# Patient Record
Sex: Male | Born: 1960 | Race: White | Hispanic: No | Marital: Single | Smoking: Current every day smoker
Health system: Southern US, Community
[De-identification: ages and names within clinical notes are randomized; demographics above are authoritative.]

## PROBLEM LIST (undated history)

## (undated) DIAGNOSIS — M0500 Felty's syndrome, unspecified site: Secondary | ICD-10-CM

## (undated) DIAGNOSIS — M059 Rheumatoid arthritis with rheumatoid factor, unspecified: Secondary | ICD-10-CM

## (undated) DIAGNOSIS — J449 Chronic obstructive pulmonary disease, unspecified: Secondary | ICD-10-CM

## (undated) DIAGNOSIS — F17200 Nicotine dependence, unspecified, uncomplicated: Secondary | ICD-10-CM

## (undated) DIAGNOSIS — K439 Ventral hernia without obstruction or gangrene: Secondary | ICD-10-CM

## (undated) DIAGNOSIS — D61818 Other pancytopenia: Secondary | ICD-10-CM

## (undated) NOTE — *Deleted (*Deleted)
[facility] EMERGENCY DEPARTMENT Provider Note   CSN: [id number] Arrival date & time: 01/17/20  1445     History Chief Complaint  Patient presents with  . Respiratory Distress    Marcus [name] is a 66 y.o. male.  HPI     Past Medical History:  Diagnosis Date  . COPD (chronic obstructive pulmonary disease) (HCC)   . Felty syndrome (HCC)   . Hernia, epigastric   . Pancytopenia (HCC)   . Seropositive rheumatoid arthritis (HCC)   . Tobacco use disorder     Patient Active Problem List   Diagnosis Date Noted  . COPD with acute exacerbation (HCC) 08/18/2019  . 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 Anemia 11/14/2015  . Seropositive rheumatoid arthritis (HCC)   . Chronic obstructive pulmonary disease (HCC)   . Protein-calorie malnutrition, severe 07/24/2015  . Pancytopenia (HCC) 07/23/2015  . 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HCC) 06/06/2013  . Leukopenia 10/21/2012  . 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 Abnormal EKG 10/21/2012  . Joint pain 10/21/2012  . Splenomegaly 10/21/2012    History reviewed. No pertinent surgical history.     Family History  Problem Relation Age of Onset  . CAD Brother   . COPD Brother     Social History   Tobacco Use  . Smoking status: Current Every Day Smoker    Packs/day: 1.00    Types: Cigarettes  . Smokeless tobacco: Never Used  Substance Use Topics  . Alcohol use: No    Alcohol/week: 0.0 standard drinks  . Drug use: Yes    Types: Marijuana    Home Medications Prior to Admission medications   Medication Sig Start Date End Date Taking? Authorizing Provider  albuterol (PROVENTIL) (2.5 MG/3ML) 0.083% nebulizer solution Take 3 mLs (2.5 mg total) by nebulization every 6 (six) hours as needed for wheezing or shortness of breath. 08/20/19   Zannie Cove, MD  albuterol (VENTOLIN HFA) 108 (90 Base) MCG/ACT inhaler Inhale 2 puffs into the lungs every 6 (six) hours as needed for wheezing or shortness of breath. 08/20/19   Zannie Cove, MD  azithromycin  (ZITHROMAX Z-PAK) 250 MG tablet Take 2 tablets (500 mg) on  Day 1,  followed by 1 tablet (250 mg) once daily on Days 2 through 5. Patient not taking: Reported on 01/17/2020 12/01/19   Sharman Cheek, MD  mometasone-formoterol [facility]) 200-5 MCG/ACT AERO Inhale 1 puff into the lungs 2 (two) times daily. 08/20/19   Zannie Cove, MD    Allergies    Levaquin [levofloxacin in d5w] and Methotrexate  Review of Systems   Review of Systems  Physical Exam Updated Vital Signs BP 115/83 (BP Location: Right Arm)   Pulse (!) 132   Resp (!) 28   Wt 63.8 kg   SpO2 100%   BMI 20.18 kg/m   Physical Exam  ED Results / Procedures / Treatments   Labs (all labs ordered are listed, but only abnormal results are displayed) Labs Reviewed  RESPIRATORY PANEL BY RT PCR (FLU A&B, COVID)  CULTURE, BLOOD (ROUTINE X 2)  CULTURE, BLOOD (ROUTINE X 2)  LACTIC ACID, PLASMA  LACTIC ACID, PLASMA  CBC WITH DIFFERENTIAL/PLATELET  COMPREHENSIVE METABOLIC PANEL  D-DIMER, QUANTITATIVE (NOT AT [facility])  PROCALCITONIN  LACTATE DEHYDROGENASE  FERRITIN  TRIGLYCERIDES  FIBRINOGEN  C-REACTIVE PROTEIN  BLOOD GAS, VENOUS    EKG None  Radiology No results found.  Procedures Procedures (including critical care time)  Medications Ordered in ED Medications  methylPREDNISolone sodium succinate (SOLU-MEDROL) 125 mg/2 mL injection 125 mg (has  no administration in time range)    ED Course  I have reviewed the triage vital signs and the nursing notes.  Pertinent labs & imaging results that were available during my care of the patient were reviewed by me and considered in my medical decision making (see chart for details).  Clinical Course as of Jan 16 1542  Tue Jan 17, 2020  1510 I talked to his brother Marcus [name] on the phone, he wants [place] to do anything we can.   [EW]  1530 At that time I initially saw the patient oxygen saturation was mid 80s on facemask oxygen however it was not fitting well.  The patient has  now been transitioned to BiPAP, and saturating 97%.  He remains on oxygen supplementation.   [EW]    Clinical Course User Index [EW] Mancel Bale, MD   MDM Rules/Calculators/A&P                          *** Final Clinical Impression(s) / ED Diagnoses Final diagnoses:  None    Rx / DC Orders ED Discharge Orders    None

---

## 2005-02-05 ENCOUNTER — Other Ambulatory Visit: Payer: Self-pay

## 2005-02-05 ENCOUNTER — Emergency Department: Payer: Self-pay | Admitting: Emergency Medicine

## 2005-02-05 IMAGING — CR DG SHOULDER 3+V*L*
1 series · 3 of 3 positions shown · non-contrast
Comparison: none

REASON FOR EXAM: Left shoulder pain  rm 11
COMMENTS:

PROCEDURE:     DXR - DXR SHOULDER LEFT COMPLETE  - [DATE] [DATE]
RESULT:        There is no evidence of fracture, dislocation or
malalignment.  The LEFT lung apex appears unremarkable.

[Series 1: view not recorded · 0.17mm/px · 3 of 3 slices shown]
[im 1/3]
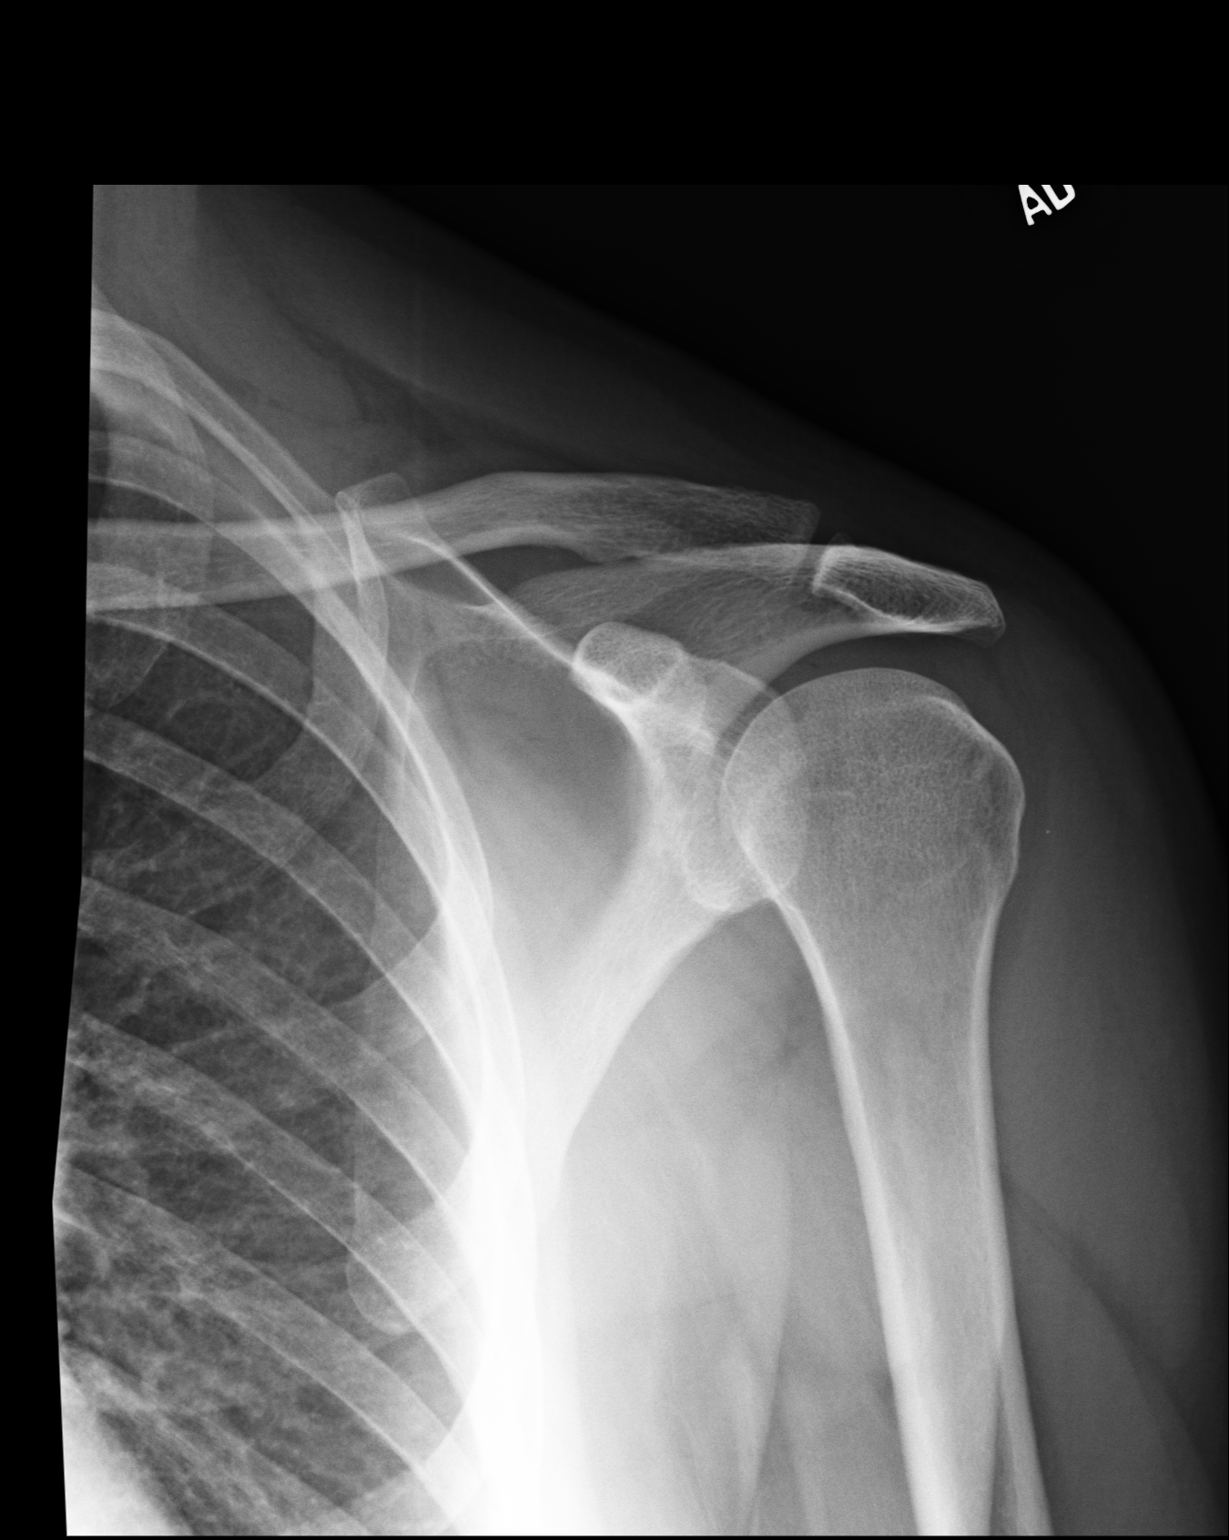
[im 2/3]
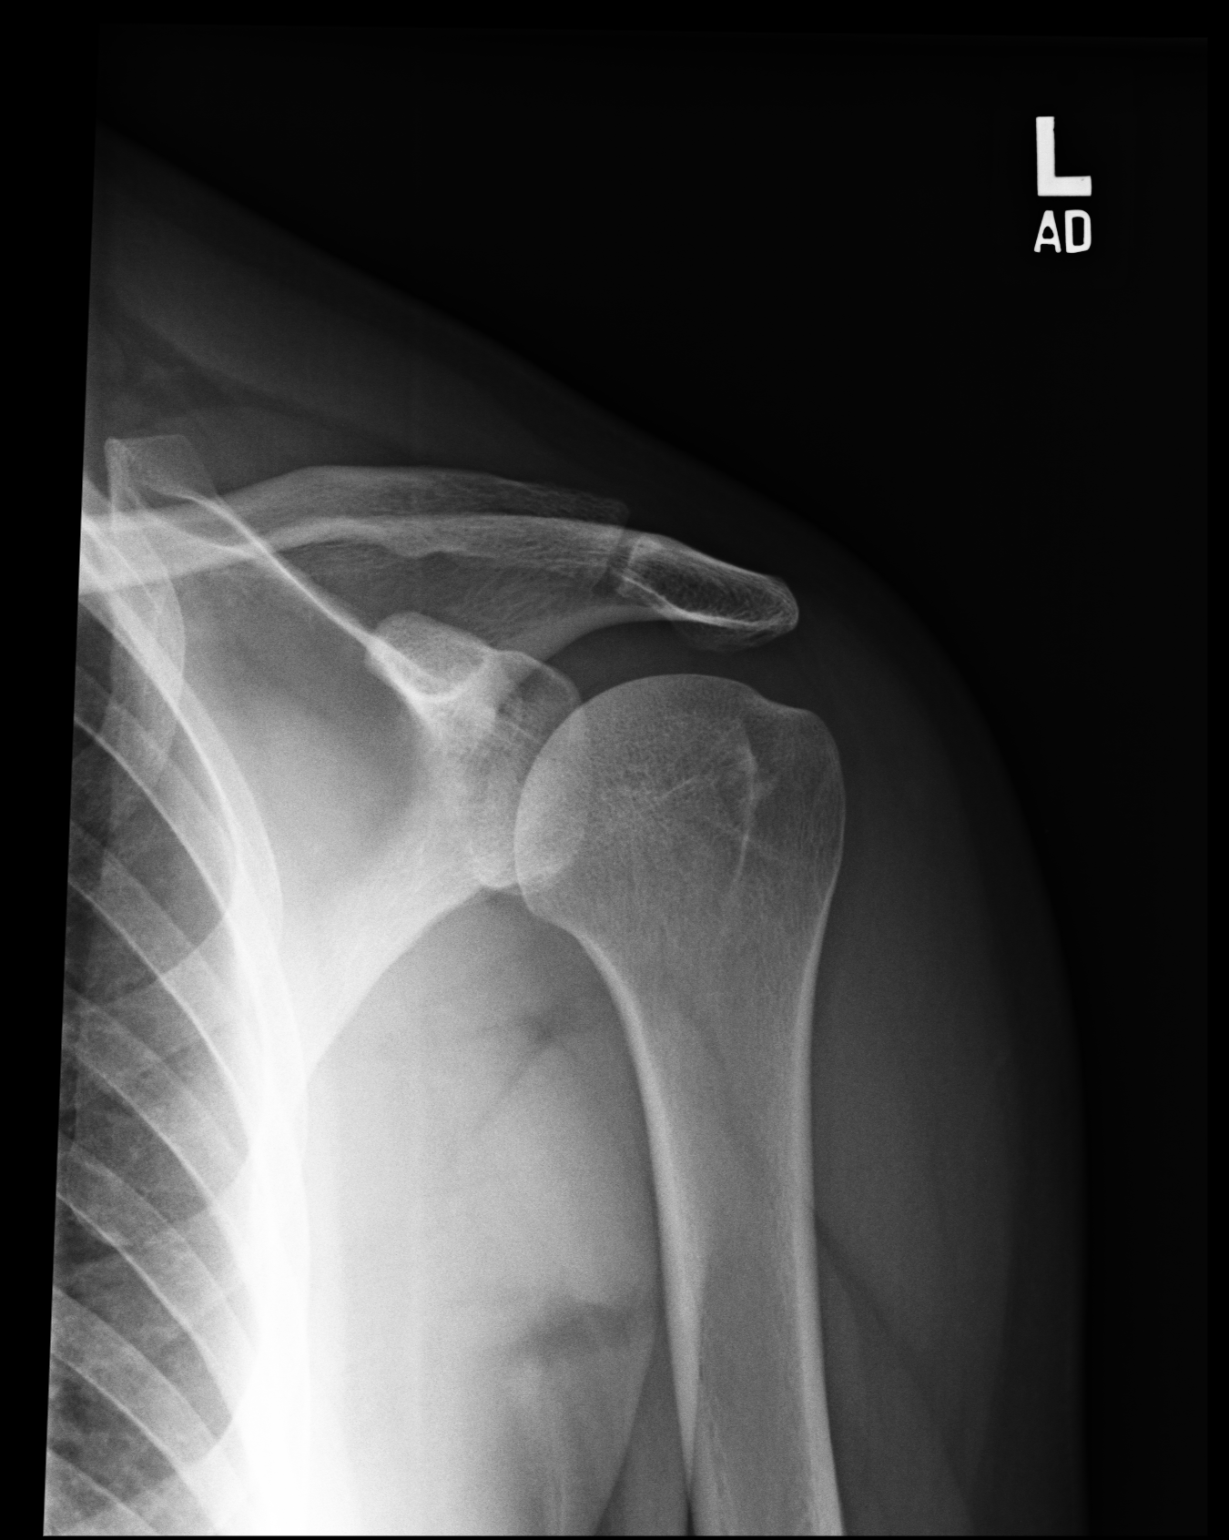
[im 3/3]
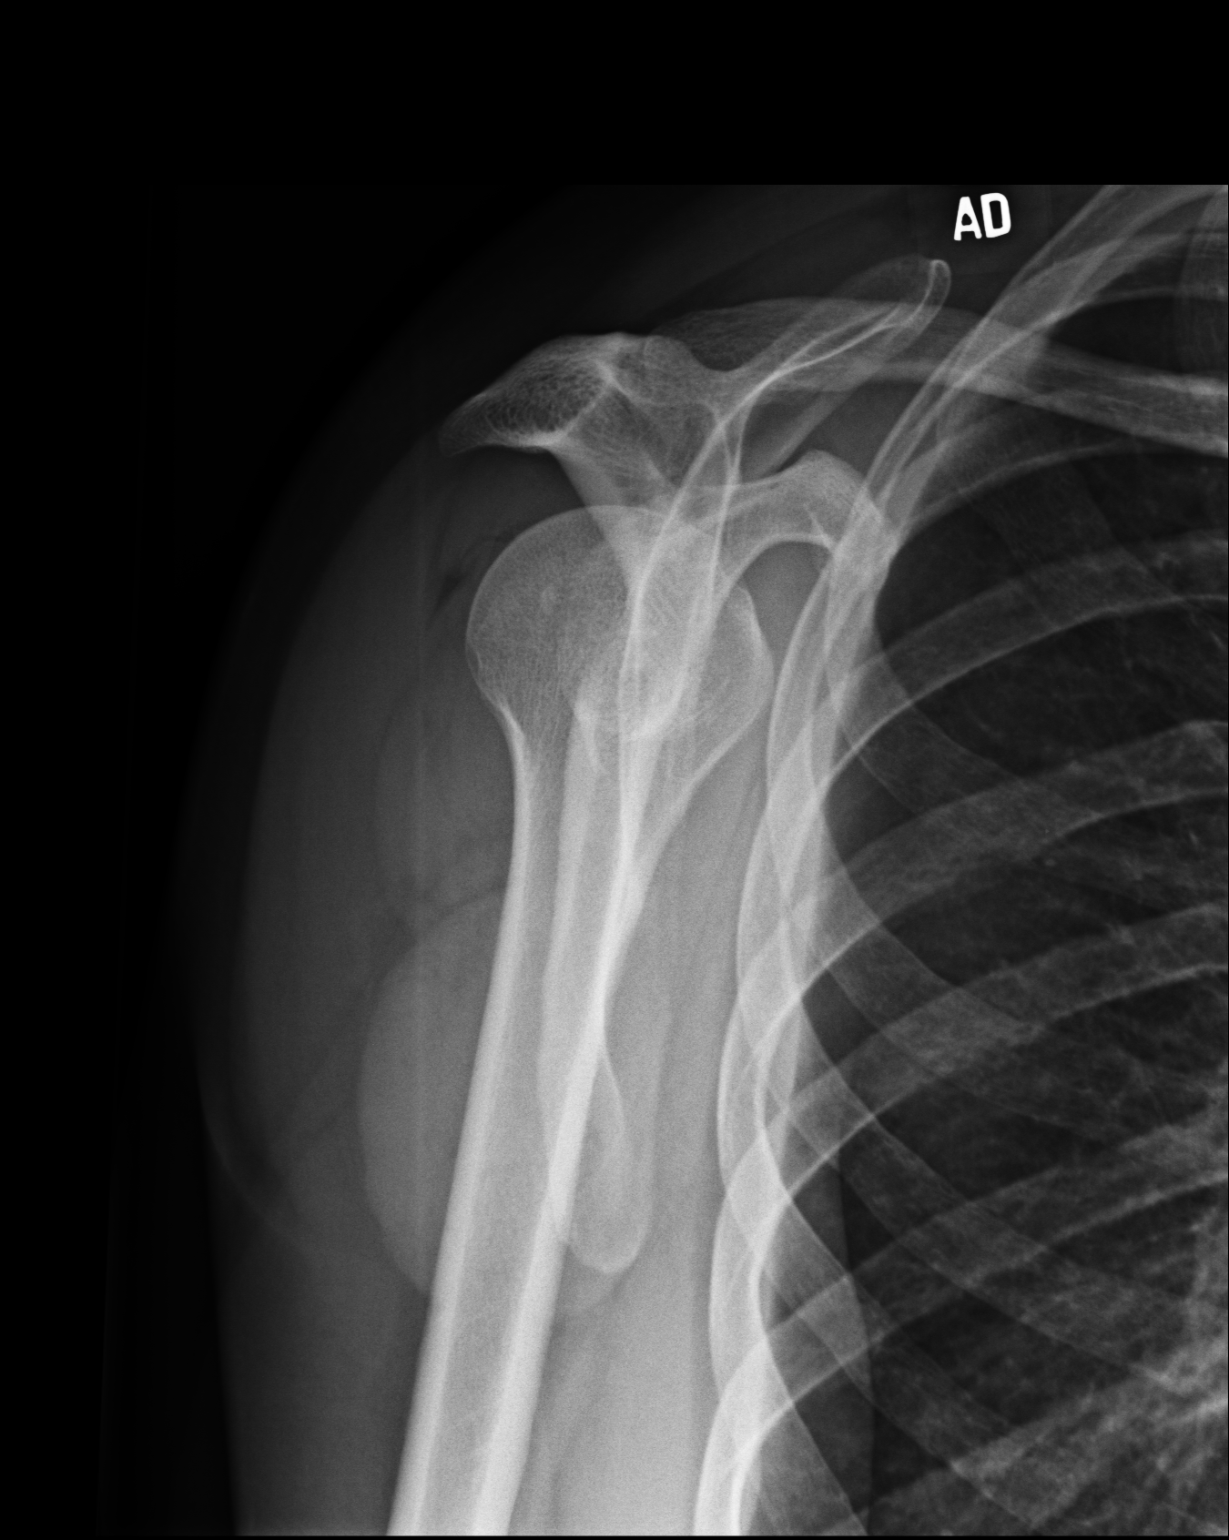

[3 of 3 positions shown; findings below may reference images not displayed]

IMPRESSION: No evidence of acute abnormalities.  If there is
persistent complaints of pain or persistent clinical concern, a repeat
evaluation in 7-10 days is recommended if clinically warranted.

## 2012-10-21 ENCOUNTER — Emergency Department (HOSPITAL_COMMUNITY)
Admission: EM | Admit: 2012-10-21 | Discharge: 2012-10-21 | Disposition: A | Discharge status: Home / Self Care | Payer: Self-pay | Attending: Emergency Medicine | Admitting: Emergency Medicine

## 2012-10-21 ENCOUNTER — Encounter (HOSPITAL_COMMUNITY): Payer: Self-pay | Admitting: *Deleted

## 2012-10-21 ENCOUNTER — Emergency Department (HOSPITAL_COMMUNITY): Payer: Self-pay

## 2012-10-21 DIAGNOSIS — M2550 Pain in unspecified joint: Secondary | ICD-10-CM | POA: Insufficient documentation

## 2012-10-21 DIAGNOSIS — L02419 Cutaneous abscess of limb, unspecified: Secondary | ICD-10-CM

## 2012-10-21 DIAGNOSIS — R059 Cough, unspecified: Secondary | ICD-10-CM | POA: Insufficient documentation

## 2012-10-21 DIAGNOSIS — D696 Thrombocytopenia, unspecified: Secondary | ICD-10-CM | POA: Insufficient documentation

## 2012-10-21 DIAGNOSIS — M79609 Pain in unspecified limb: Secondary | ICD-10-CM | POA: Insufficient documentation

## 2012-10-21 DIAGNOSIS — F17200 Nicotine dependence, unspecified, uncomplicated: Secondary | ICD-10-CM | POA: Insufficient documentation

## 2012-10-21 DIAGNOSIS — R9431 Abnormal electrocardiogram [ECG] [EKG]: Secondary | ICD-10-CM

## 2012-10-21 DIAGNOSIS — J029 Acute pharyngitis, unspecified: Secondary | ICD-10-CM | POA: Insufficient documentation

## 2012-10-21 DIAGNOSIS — R161 Splenomegaly, not elsewhere classified: Secondary | ICD-10-CM

## 2012-10-21 DIAGNOSIS — IMO0002 Reserved for concepts with insufficient information to code with codable children: Secondary | ICD-10-CM | POA: Insufficient documentation

## 2012-10-21 DIAGNOSIS — D72819 Decreased white blood cell count, unspecified: Secondary | ICD-10-CM | POA: Insufficient documentation

## 2012-10-21 DIAGNOSIS — M7989 Other specified soft tissue disorders: Secondary | ICD-10-CM | POA: Insufficient documentation

## 2012-10-21 DIAGNOSIS — R05 Cough: Secondary | ICD-10-CM | POA: Insufficient documentation

## 2012-10-21 DIAGNOSIS — G8929 Other chronic pain: Secondary | ICD-10-CM | POA: Insufficient documentation

## 2012-10-21 LAB — COMPREHENSIVE METABOLIC PANEL
AST: 10 U/L (ref 0–37)
Albumin: 3.1 g/dL — ABNORMAL LOW (ref 3.5–5.2)
Alkaline Phosphatase: 58 U/L (ref 39–117)
Chloride: 99 mEq/L (ref 96–112)
Creatinine, Ser: 0.7 mg/dL (ref 0.50–1.35)
Potassium: 3.7 mEq/L (ref 3.5–5.1)
Total Bilirubin: 0.5 mg/dL (ref 0.3–1.2)
Total Protein: 8.7 g/dL — ABNORMAL HIGH (ref 6.0–8.3)

## 2012-10-21 LAB — URINALYSIS, ROUTINE W REFLEX MICROSCOPIC
Glucose, UA: NEGATIVE mg/dL
Ketones, ur: NEGATIVE mg/dL
Leukocytes, UA: NEGATIVE
Nitrite: NEGATIVE
Specific Gravity, Urine: >1.03 — ABNORMAL HIGH (ref 1.005–1.030)
pH: 5.5 (ref 5.0–8.0)

## 2012-10-21 LAB — CBC WITH DIFFERENTIAL/PLATELET
Basophils Absolute: 0 10*3/uL (ref 0.0–0.1)
Eosinophils Absolute: 0.1 10*3/uL (ref 0.0–0.7)
Lymphocytes Relative: 49 % — ABNORMAL HIGH (ref 12–46)
Monocytes Relative: 24 % — ABNORMAL HIGH (ref 3–12)
Platelets: 132 10*3/uL — ABNORMAL LOW (ref 150–400)
RBC: 4.1 MIL/uL — ABNORMAL LOW (ref 4.22–5.81)
WBC: 1.5 10*3/uL — ABNORMAL LOW (ref 4.0–10.5)

## 2012-10-21 LAB — URINE MICROSCOPIC-ADD ON

## 2012-10-21 IMAGING — CR DG CHEST 2V
2 series · 2 of 2 positions shown · non-contrast
Comparison: None.

CLINICAL DATA: Hemoptysis, chest pain intermittently

CHEST - 2 VIEW

[view not recorded (1 of 2)]
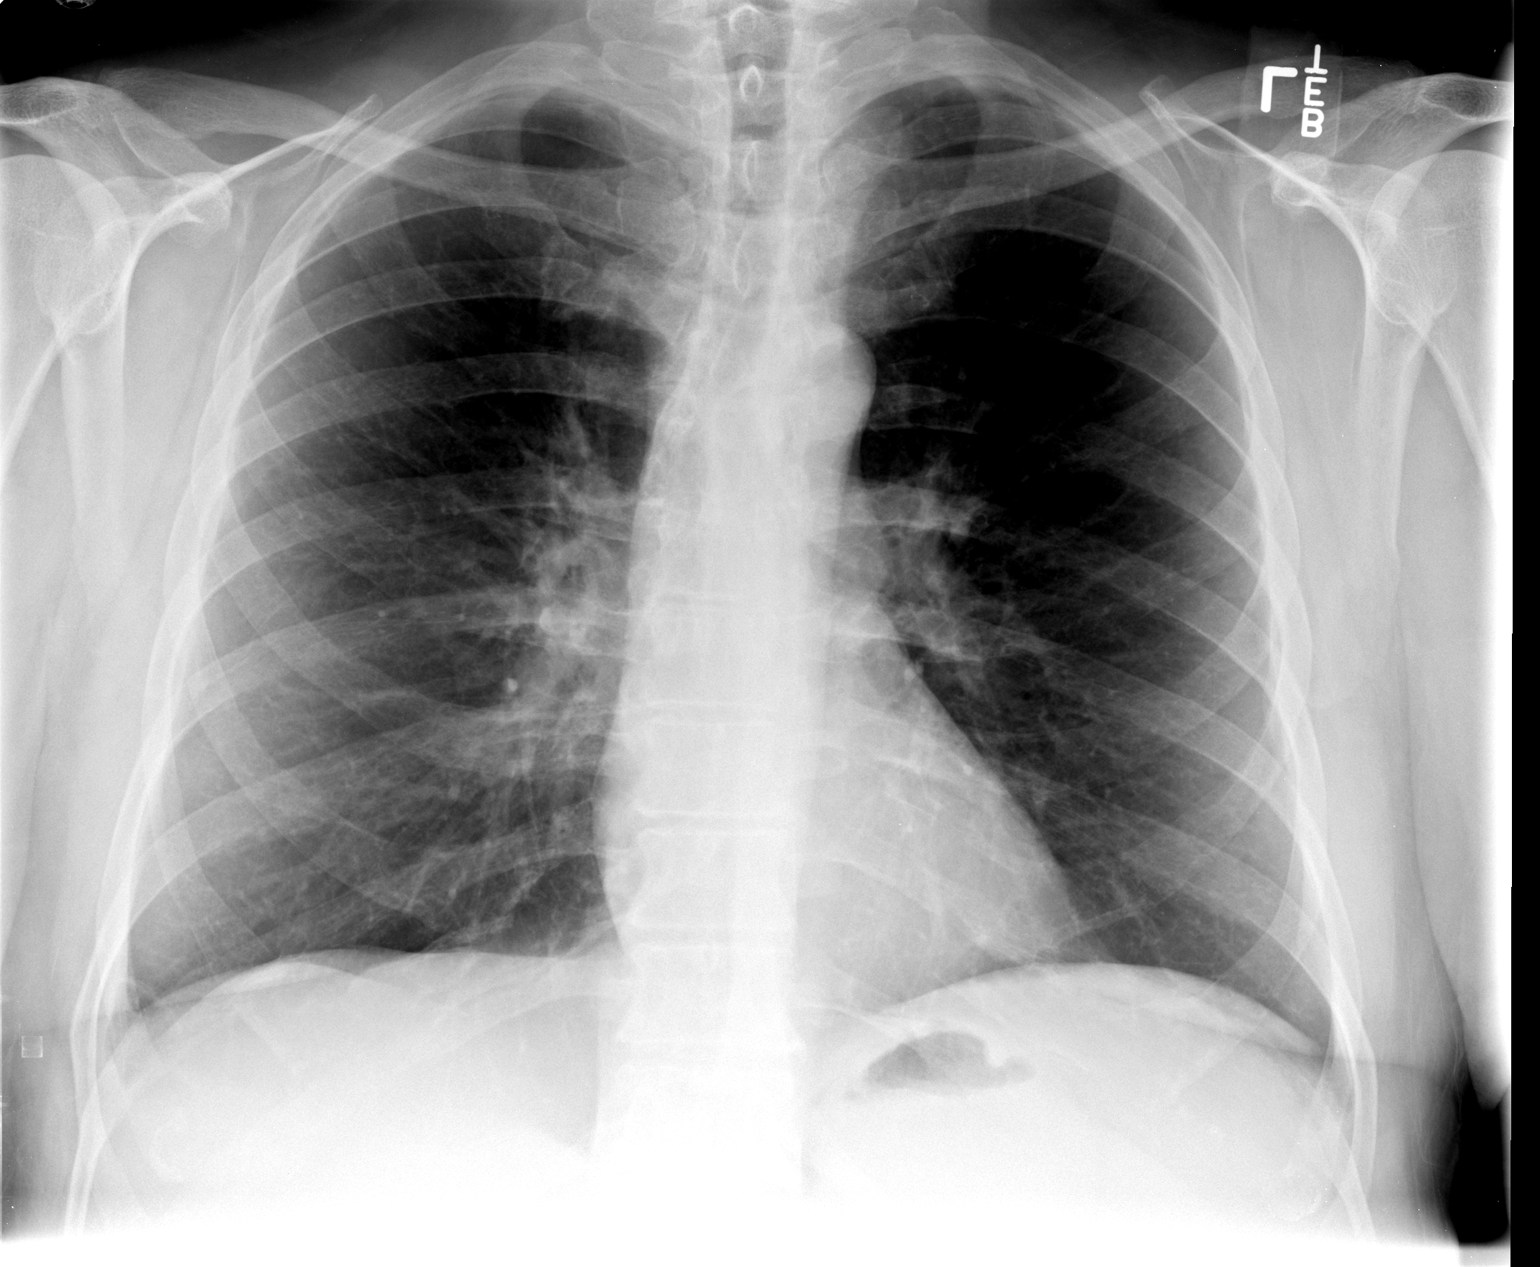

[view not recorded (2 of 2)]
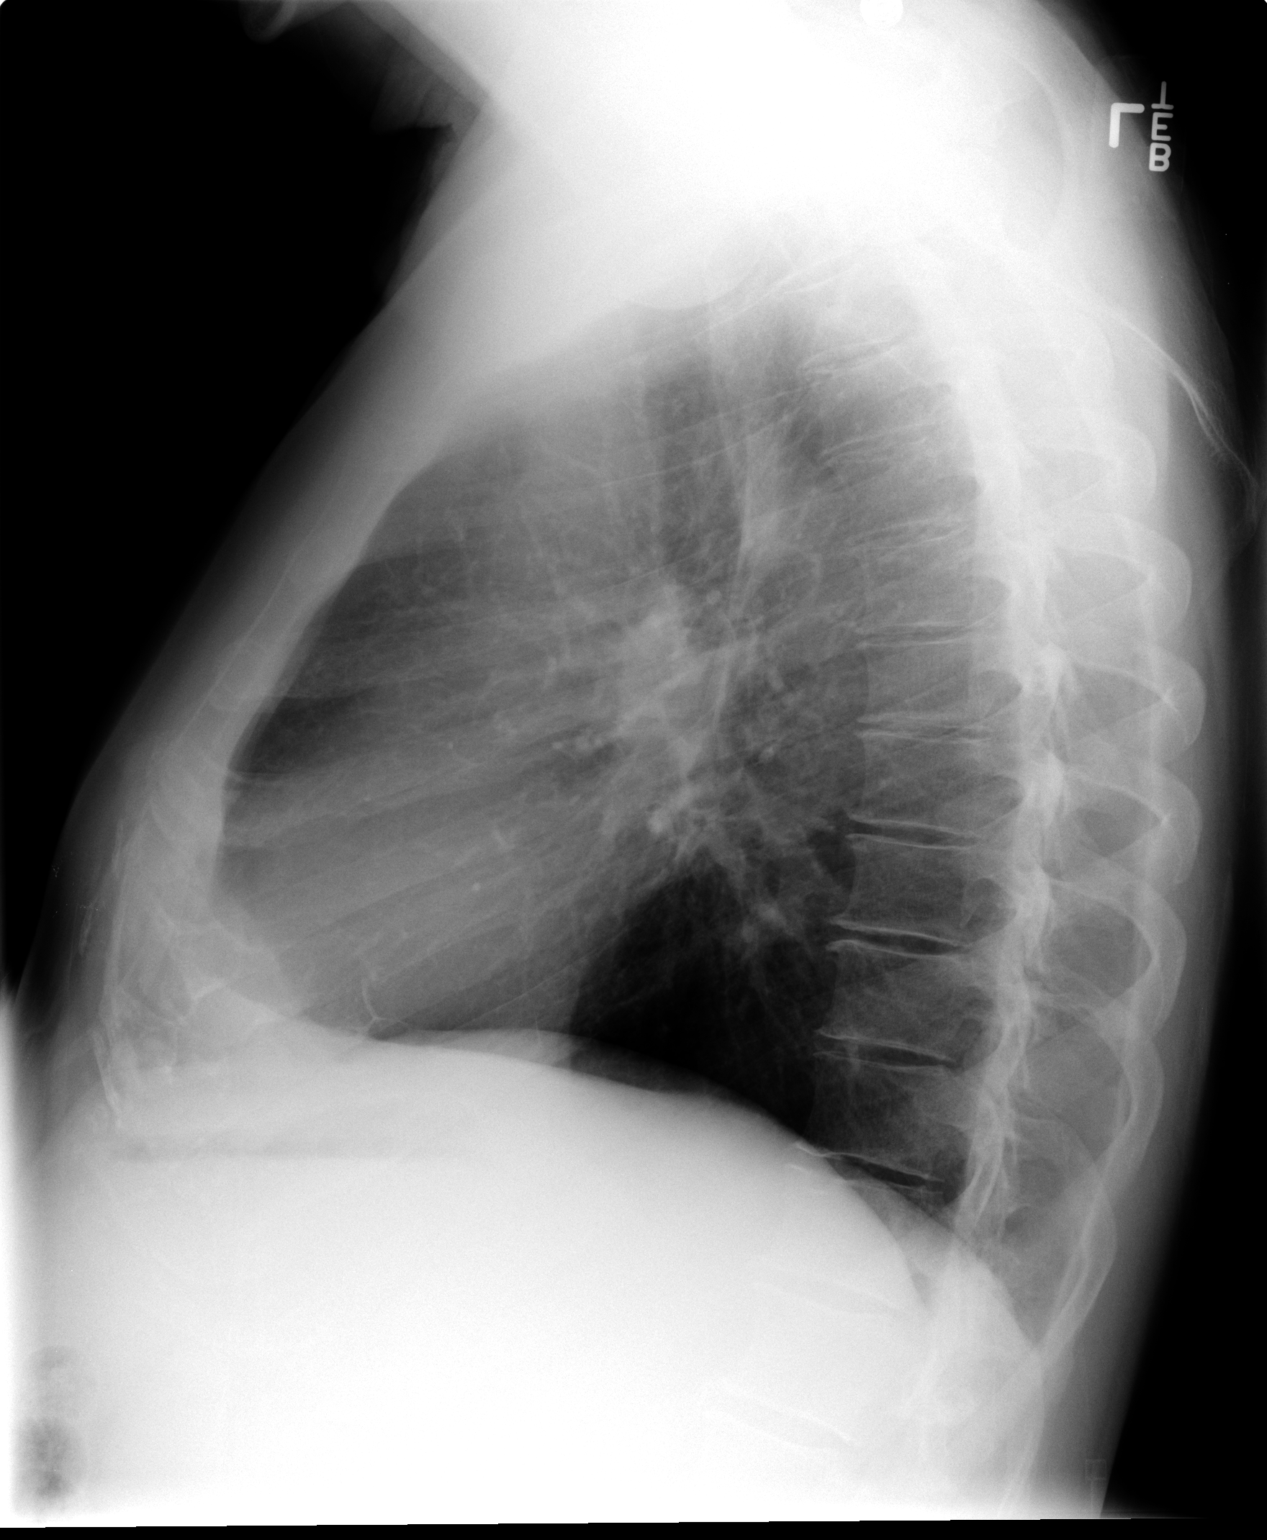

[2 of 2 positions shown; findings below may reference images not displayed]

FINDINGS: Cardiomediastinal silhouette is within normal limits. The
lungs are clear. No pleural effusion.  No pneumothorax.  No acute
osseous abnormality.  The hemidiaphragms are flattened which may be
seen with emphysema. Minimal right costophrenic angle scarring or
pleural thickening noted.
IMPRESSION: Flattening of the hemidiaphragms which may be seen with emphysema.
No acute finding.

## 2012-10-21 MED ORDER — IBUPROFEN 400 MG PO TABS
400.0000 mg | ORAL_TABLET | Freq: Once | ORAL | Status: AC
  Administered 2012-10-21: 400 mg via ORAL
  Filled 2012-10-21: qty 1

## 2012-10-21 MED ORDER — DOXYCYCLINE HYCLATE 100 MG PO TABS
100.0000 mg | ORAL_TABLET | Freq: Once | ORAL | Status: AC
  Administered 2012-10-21: 100 mg via ORAL
  Filled 2012-10-21: qty 1

## 2012-10-21 MED ORDER — VANCOMYCIN HCL IN DEXTROSE 1-5 GM/200ML-% IV SOLN
1000.0000 mg | Freq: Once | INTRAVENOUS | Status: DC
  Filled 2012-10-21: qty 200

## 2012-10-21 MED ORDER — OXYCODONE HCL 5 MG PO TABA: 5.0000 mg | ORAL_TABLET | Freq: Three times a day (TID) | ORAL | Status: DC | PRN

## 2012-10-21 MED ORDER — DOXYCYCLINE HYCLATE 100 MG PO TABS: 100.0000 mg | ORAL_TABLET | Freq: Two times a day (BID) | ORAL | Status: DC

## 2012-10-21 NOTE — ED Notes (Signed)
Dr. Krishnan in room assessing patient at this time.  

## 2012-10-21 NOTE — ED Notes (Signed)
Spoke with Dr. Rito Ehrlich and patient. Patient stated he was willing to wait and receive Vancomycin IV over an hour. Dr. Rito Ehrlich stated to hold off for now, that he was going to speak to Dr. Hyacinth Meeker. Patient states MDs wanted him to be admitted, but he wishes not to be due to family situation.

## 2012-10-21 NOTE — Consult Note (Signed)
[facility] Medical Consultation  Marcus [name] ZOX:[id number] DOB: [birth year] DOA: 10/21/2012 Marcus [name] is an 52 y.o. male.    PCP: He does not have a primary care physician   Requesting physician: Dr. Eber Hong with the emergency department Date of consultation: 10/21/2012  Reason for consultation: Swelling in axillary area and leukopenia  Chief Complaint: Pain and swelling in both the armpits  HPI: This is a 52 year old, Caucasian male, with no past medical history, who presents to the emergency department because of pain and swelling in both his armpits. He tells me that this has been ongoing for the last 10 months or so. However, in the last week or so the swelling has gotten worse. He is experiencing worsening pain as well. The pain is 8 or 10 in intensity. It hurts him when he lifts his arm, but he does have good range of motion. He has had generalized weakness. Sometimes these lesions are draining yellowish pus. He tried taking over-the-counter pain reliever without any significant relief. He used alcohol and peroxide over these lesions as well. Because these lesions were not getting better he decided to come in to the hospital today. He denies any fever per se, but has had some chills. Denies any blood in the stool. Denies any abdominal pain. Denies any nausea, vomiting. Denies chest pain or shortness of breath. He's lost about 10 pounds in the last one year. He also states that he has joint pains in his elbows, shoulders, and the joints of his hands, and his knees, which have also been ongoing for at least one year. He has not seen a physician in more than a year. 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   Home Medications: Prior to Admission medications   Medication Sig Start Date End Date Taking? Authorizing Provider  acetaminophen (PAIN RELIEVER) 325 MG tablet Take 325 mg by mouth daily as needed for pain.   Yes Historical Provider, MD   doxycycline (VIBRA-TABS) 100 MG tablet Take 1 tablet (100 mg total) by mouth 2 (two) times daily. For 14 days starting 7/18 at [facility] 10/22/12   Osvaldo Shipper, MD  OxyCODONE HCl, Abuse Deter, 5 MG TABA Take 5 mg by mouth 3 (three) times daily as needed (for pain). 10/21/12   Osvaldo Shipper, MD    Allergies: No Known Allergies  Past Medical History: Patient denies any significant medical history the past. Denies any surgeries in the past  Social History:   reports that he has been smoking Cigarettes.  He has been smoking about 1.00 pack per day. He does not have any smokeless tobacco history on file. He reports that he uses illicit drugs (Marijuana). He reports that he does not drink alcohol.  Living Situation: He lives in [place] and takes care of his elderly mother Activity Level: Independent with daily activities   Family History: Denies any health problems in the family  Review of Systems - History obtained from the patient General ROS: positive for  - fatigue and malaise Psychological ROS: negative Ophthalmic ROS: negative ENT ROS: negative Allergy and Immunology ROS: negative Hematological and Lymphatic ROS: negative Endocrine ROS: negative Respiratory ROS: no cough, shortness of breath, or wheezing Cardiovascular ROS: no chest pain or dyspnea on exertion Gastrointestinal ROS: no abdominal pain, change in bowel habits, or black or bloody stools Genito-Urinary ROS: no dysuria, trouble voiding, or hematuria Musculoskeletal ROS: as in hpi Neurological ROS: no TIA or stroke symptoms Dermatological  ROS: as in hpi He also specifically denied any night sweats, night fevers.  Physical Examination: Filed Vitals:   10/21/12 1624 10/21/12 1825 10/21/12 1955  BP: 163/82 120/74 113/63  Pulse: 96 80 84  Temp: 98.4 F (36.9 C)  98.9 F (37.2 C)  TempSrc: Oral  Oral  Resp: 18 20 20   Height: 5\' 10"  (1.778 m)    Weight: 82.555 kg (182 lb)    SpO2: 100% 97% 97%    General  appearance: alert, cooperative, appears stated age and no distress Head: Normocephalic, without obvious abnormality, atraumatic Eyes: conjunctivae/corneas clear. PERRL, EOM's intact.  Throat: lips, mucosa, and tongue normal; teeth and gums normal Neck: no adenopathy, no carotid bruit, no JVD, supple, symmetrical, trachea midline and thyroid not enlarged, symmetric, no tenderness/mass/nodules Back: symmetric, no curvature. ROM normal. No CVA tenderness. Resp: clear to auscultation bilaterally Cardio: regular rate and rhythm, S1, S2 normal, no murmur, click, rub or gallop GI: soft, non-tender; bowel sounds normal; no masses,  no organomegaly Extremities: He has swelling and indurated areas in both his axillary regions. Left more than the right. It is somewhat tender to palpation and warm to touch. Some erythema is also noted. No active drainage is noted. Pulses: 2+ and symmetric Skin: As described above Lymph nodes: He has enlarged axillary lymph nodes bilaterally Neurologic: He is alert and oriented x3. Cranial nerves intact good motor strength is equal bilaterally, upper and lower extremities. Gait is normal  Laboratory Data: Results for orders placed during the hospital encounter of 10/21/12 (from the past 48 hour(s))  CBC WITH DIFFERENTIAL     Status: Abnormal   Collection Time    10/21/12  5:11 PM      Result Value Range   WBC 1.5 (*) 4.0 - 10.5 K/uL   RBC 4.10 (*) 4.22 - 5.81 MIL/uL   Hemoglobin 11.0 (*) 13.0 - 17.0 g/dL   HCT 56.2 (*) 13.0 - 86.5 %   MCV 81.5  78.0 - 100.0 fL   MCH 26.8  26.0 - 34.0 pg   MCHC 32.9  30.0 - 36.0 g/dL   RDW 78.4 (*) 69.6 - 29.5 %   Platelets 132 (*) 150 - 400 K/uL   Other PENDING PATHOLOGIST REVIEW     Neutrophils Relative % 21 (*) 43 - 77 %   Lymphocytes Relative 49 (*) 12 - 46 %   Monocytes Relative 24 (*) 3 - 12 %   Eosinophils Relative 5  0 - 5 %   Basophils Relative 1  0 - 1 %   Neutro Abs 0.3 (*) 1.7 - 7.7 K/uL   Lymphs Abs 0.7  0.7 - 4.0  K/uL   Monocytes Absolute 0.4  0.1 - 1.0 K/uL   Eosinophils Absolute 0.1  0.0 - 0.7 K/uL   Basophils Absolute 0.0  0.0 - 0.1 K/uL   WBC Morphology WHITE COUNT CONFIRMED ON SMEAR    COMPREHENSIVE METABOLIC PANEL     Status: Abnormal   Collection Time    10/21/12  5:11 PM      Result Value Range   Sodium 134 (*) 135 - 145 mEq/L   Potassium 3.7  3.5 - 5.1 mEq/L   Chloride 99  96 - 112 mEq/L   CO2 28  19 - 32 mEq/L   Glucose, Bld 108 (*) 70 - 99 mg/dL   BUN 13  6 - 23 mg/dL   Creatinine, Ser 2.84  0.50 - 1.35 mg/dL   Calcium 8.9  8.4 -  10.5 mg/dL   Total Protein 8.7 (*) 6.0 - 8.3 g/dL   Albumin 3.1 (*) 3.5 - 5.2 g/dL   AST 10  0 - 37 U/L   ALT 5  0 - 53 U/L   Alkaline Phosphatase 58  39 - 117 U/L   Total Bilirubin 0.5  0.3 - 1.2 mg/dL   GFR calc non Af Amer >90  >90 mL/min   GFR calc Af Amer >90  >90 mL/min   Comment:            The eGFR has been calculated     using the CKD EPI equation.     This calculation has not been     validated in all clinical     situations.     eGFR's persistently     <90 mL/min signify     possible Chronic Kidney Disease.  SEDIMENTATION RATE     Status: Abnormal   Collection Time    10/21/12  5:11 PM      Result Value Range   Sed Rate 107 (*) 0 - 16 mm/hr  RAPID HIV SCREEN [facility])     Status: None   Collection Time    10/21/12  5:11 PM      Result Value Range   SUDS Rapid HIV Screen NON REACTIVE  NON REACTIVE  URINALYSIS, ROUTINE W REFLEX MICROSCOPIC     Status: Abnormal   Collection Time    10/21/12  5:37 PM      Result Value Range   Color, Urine YELLOW  YELLOW   APPearance TURBID (*) CLEAR   Specific Gravity, Urine >1.030 (*) 1.005 - 1.030   pH 5.5  5.0 - 8.0   Glucose, UA NEGATIVE  NEGATIVE mg/dL   Hgb urine dipstick NEGATIVE  NEGATIVE   Bilirubin Urine SMALL (*) NEGATIVE   Ketones, ur NEGATIVE  NEGATIVE mg/dL   Protein, ur NEGATIVE  NEGATIVE mg/dL   Urobilinogen, UA 0.2  0.0 - 1.0 mg/dL   Nitrite NEGATIVE  NEGATIVE   Leukocytes,  UA NEGATIVE  NEGATIVE  URINE MICROSCOPIC-ADD ON     Status: None   Collection Time    10/21/12  5:37 PM      Result Value Range   Squamous Epithelial / LPF RARE  RARE   Bacteria, UA RARE  RARE   Urine-Other AMORPHOUS URATES/PHOSPHATES      Imaging Studies: Dg Chest 2 View  10/21/2012   *RADIOLOGY REPORT*  Clinical Data: Hemoptysis, chest pain intermittently  CHEST - 2 VIEW  Comparison: None.  Findings: Cardiomediastinal silhouette is within normal limits. The lungs are clear. No pleural effusion.  No pneumothorax.  No acute osseous abnormality.  The hemidiaphragms are flattened which may be seen with emphysema. Minimal right costophrenic angle scarring or pleural thickening noted.  IMPRESSION: Flattening of the hemidiaphragms which may be seen with emphysema. No acute finding.   Original Report Authenticated By: Christiana Pellant, M.D.    EKG: Sinus rhythm at 85 beats per minute. Normal axis. Intervals are normal. No Q waves. No concerning ST segment changes. T. inversion noted in lead 1 and aVL. No older EKGs available for comparison.  Impression/Recommendations  Active Problems:   Leukopenia   Axillary abscess   Abnormal EKG   Joint pain   Splenomegaly   This is a 52 year old, Caucasian male, who presents with bilateral axillary pain and swelling. On examination he appears to have cellulitis and possibly hidradenitis. The swelling is more prominent in the left eye  exhibits area. It is somewhat warm to touch. However, patient does not have any other signs, symptoms of sepsis or any systemic involvement. He is afebrile. He also was noted have leukopenia on blood work with mild thrombocytopenia. He also has an abnormal EKG. He was also noted to have splenomegaly on examination  #1 axillary cellulitis versus abscess: He could have hydradenitis. I think it may be reasonable for the patient to be admitted at least for 24-36 hours to receive an intravenous antibiotics and to initiate workup for  his various abnormalities. However, patient takes care of his elderly mother and does not want to be admitted. He is stable enough at this time that treatment can be given with oral antibiotics. So, he'll be discharged on oral doxycycline. He'll be given a dose here in the ED prior to discharge. He'll be given pain medications as well. He'll be provided the phone number for local general surgeon  #2 leukopenia and thrombocytopenia: Etiology is unclear. He does have mild splenomegaly on examination. He will require autoimmune workup, as well as some hematological workup. Patient has been explained all of the above. He is been strongly encouraged to consider seeing a physician as soon as possible for these findings.  #3 arthralgias: He could have connective tissue disorder. He will require further workup for the same weight. He wants to proceed as outpatient.  #4 abnormal EKG: He denies any chest pain or shortness of breath. This can be pursued as an outpatient.  So, patient will be discharged on oral doxycycline for 2 weeks. He'll be given a prescription for oxycodone. He'll be provided contact information for Dr. Franky Macho. General Careers adviser. Patient given instructions to come back to the hospital if his symptoms get worse or if he develops new symptoms.  All of the above has been discussed with Dr. Eber Hong.  Thank you for this consultation.  [facility]  [facility] Pager [phone]  If 7PM-7AM, please contact night-coverage.  [email or web address] Password [email or web address]  10/21/2012, 9:00 PM

## 2012-10-21 NOTE — ED Notes (Signed)
Last July patient had rash over entire body beginning after some type of insect "stinging".  Went to urgent care and was "given some kind of pill".  Since that time, he has experienced progressive weakness, fatigue, sore throats, "bone aches", no nausea, vomiting, diarrhea or frequent headaches.  Has also had periods of dizziness, diaphoresis, lack of appetite.

## 2012-10-21 NOTE — ED Provider Notes (Signed)
History    This chart was scribed for Marcus Roller, MD by Quintella Reichert, ED scribe.  This patient was seen in room APA04/[facility]04 and the patient's care was started at 4:32 PM.  CSN: [id number]  Arrival date & time 10/21/12  1621    Chief Complaint  Patient presents with  . Weakness  . Sore Throat  . Fatigue    The history is provided by the patient. No language interpreter was used.    HPI Comments: Marcus [name] is a 52 y.o. male who presents to the Emergency Department complaining of one year of progressively-worsening weakness, fatigue, sore throat, joint pain, abdominal discomfort, unintended weight loss, and swelling and pain to the feet.  Pt denies any injury or illness that may have brought on symptoms but does note he was bitten by a tick on his right foot one year ago had a minimal rash that cleared up on its own.  He states that his knees, hips, wrists, and elbows have been painful and intermittently swollen and "locking up."  He states he has lost 10 lb in the past year.    He also notes intermittent cough and states "I coughed up blood several times."   He denies dysuria or diarrhea.  Pt does not take any medications regularly except for OTC pain medications. He denies DM to his knowledge.  He discretely describes nodules over his bilateral forearms and his Achilles tendon on the left as well as swelling behind his left knee she has also been a chronic condition. Over the last couple of months he has also developed multiple draining lesions it is bilateral axillary regions. He denies fevers or night sweats  Pt has no PCP    History reviewed. No pertinent past medical history.  History reviewed. No pertinent past surgical history.  No family history on file.  History  Substance Use Topics  . Smoking status: Current Every Day Smoker -- 1.00 packs/day    Types: Cigarettes  . Smokeless tobacco: Not on file  . Alcohol Use: No    Review of Systems  All other systems  reviewed and are negative.   A complete 10 system review of systems was obtained and all systems are negative except as noted in the HPI and PMH.    Allergies  Review of patient's allergies indicates no known allergies.  Home Medications   Current Outpatient Rx  Name  Route  Sig  Dispense  Refill  . acetaminophen (PAIN RELIEVER) 325 MG tablet   Oral   Take 325 mg by mouth daily as needed for pain.           BP 163/82  Pulse 96  Temp(Src) 98.4 F (36.9 C) (Oral)  Resp 18  Ht 5\' 10"  (1.778 m)  Wt 182 lb (82.555 kg)  BMI 26.11 kg/m2  SpO2 100%  Physical Exam  Nursing note and vitals reviewed. Constitutional: He appears well-developed and well-nourished. No distress.  HENT:  Head: Normocephalic and atraumatic.  Mouth/Throat: Oropharynx is clear and moist. No oropharyngeal exudate.  No exudate asymmetry hypertrophy or erythema, the mucous membranes are moist, no trismus  Eyes: Conjunctivae and EOM are normal. Pupils are equal, round, and reactive to light. Right eye exhibits no discharge. Left eye exhibits no discharge. No scleral icterus.  Neck: Normal range of motion. Neck supple. No JVD present. No thyromegaly present.  No torticollis, no significant lymphadenopathy of the anterior or posterior cervical chains, no supraclavicular lymphadenopathy  Cardiovascular: Normal rate, regular  rhythm, normal heart sounds and intact distal pulses.  Exam reveals no gallop and no friction rub.   No murmur heard. Pulmonary/Chest: Effort normal. No respiratory distress. He has no wheezes. He has rales (scattered bilateral rales that clear with coughing).  Abdominal: Soft. Bowel sounds are normal. He exhibits no distension and no mass. There is tenderness (mild tenderness across the epigastrium and bilateral upper abdomen, no guarding).  Several cutaneous lesions that appear to be consistent with prior drained abscesses which drained spontaneously and is scabbed over  Musculoskeletal:  Normal range of motion. He exhibits no edema and no tenderness.  Subtendinous nodules of the bilateral extensor surface of the forearms, the left Achilles tendon as well as a soft compressible mass behind the left knee which is nontender  Lymphadenopathy:    He has no cervical adenopathy.       Right: Inguinal adenopathy present.  7-cm lymphadenopathy at the right inguinal region.  Neurological: He is alert. Coordination normal.  Normal gait, normal speech, normal coordination of all 4 extremities, normal strength of all 4 extremities at the major muscle groups  Skin: Skin is warm and dry. No rash noted. No erythema.  Subcutaneous nodules as described, sub-centimeters lymphadenopathy in the right inguinal region, multiple axillary erythematous tender nodules in the bilateral axillary regions, these are the reasons he described as draining yellow purulent material intermittently.  Psychiatric: He has a normal mood and affect. His behavior is normal.    ED Course  Procedures (including critical care time)  DIAGNOSTIC STUDIES: Oxygen Saturation is 100% on room air, normal by my interpretation.    COORDINATION OF CARE: 4:40 PM-Discussed treatment plan which includes CXR and labs with pt at bedside and pt agreed to plan.     Labs Reviewed  CBC WITH DIFFERENTIAL - Abnormal; Notable for the following:    WBC 1.5 (*)    RBC 4.10 (*)    Hemoglobin 11.0 (*)    HCT 33.4 (*)    RDW 16.3 (*)    Platelets 132 (*)    Neutrophils Relative % 21 (*)    Lymphocytes Relative 49 (*)    Monocytes Relative 24 (*)    Neutro Abs 0.3 (*)    All other components within normal limits  COMPREHENSIVE METABOLIC PANEL - Abnormal; Notable for the following:    Sodium 134 (*)    Glucose, Bld 108 (*)    Total Protein 8.7 (*)    Albumin 3.1 (*)    All other components within normal limits  SEDIMENTATION RATE - Abnormal; Notable for the following:    Sed Rate 107 (*)    All other components within normal  limits  URINALYSIS, ROUTINE W REFLEX MICROSCOPIC - Abnormal; Notable for the following:    APPearance TURBID (*)    Specific Gravity, Urine >1.030 (*)    Bilirubin Urine SMALL (*)    All other components within normal limits  URINE MICROSCOPIC-ADD ON  RAPID HIV SCREEN ([facility])  LYME DISEASE DNA BY PCR([facility])  PATHOLOGIST SMEAR REVIEW  B. BUR[facility] ANTIBODIES    Dg Chest 2 View  10/21/2012   *RADIOLOGY REPORT*  Clinical Data: Hemoptysis, chest pain intermittently  CHEST - 2 VIEW  Comparison: None.  Findings: Cardiomediastinal silhouette is within normal limits. The lungs are clear. No pleural effusion.  No pneumothorax.  No acute osseous abnormality.  The hemidiaphragms are flattened which may be seen with emphysema. Minimal right costophrenic angle scarring or pleural thickening noted.  IMPRESSION: Flattening of  the hemidiaphragms which may be seen with emphysema. No acute finding.   Original Report Authenticated By: Christiana Pellant, M.D.    1. Leukopenia   2. Thrombocytopenia   3. Axillary abscess     MDM  The patient's exam is consistent with a diffuse inflammatory disease, he does have what appears to be erythema nodosum, these sub-cutaneous nodules could be from multiple different etiologies and with these apparent tiny abscesses in his bilateral axillary regions, lymphadenopathy in the groin, weight loss, coughing up blood there is concern for underlying malignancy. This could be inflammatory process, this could be Lyme's disease. Thankfully his vital signs are completely normal and he does not appear to be in any distress. He does not have a physician and will need one.  ED ECG REPORT  I personally interpreted this EKG   Date: 10/21/2012   Rate: 85  Rhythm: normal sinus rhythm  QRS Axis: indeterminate  Intervals: normal  ST/T Wave abnormalities: nonspecific T wave changes  Conduction Disutrbances:Incomplete right bundle branch block  Narrative Interpretation: T-wave  abnormalities in the lateral leads, otherwise no significant ST or T wave abnormalities  Old EKG Reviewed: none available  Para discussed with Dr. 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  Hospitalist to discharge, antibiotics for home, followup recommended, patient agreed to followup.  I personally performed the services described in this documentation, which was scribed in my presence. The recorded information has been reviewed and is accurate.         Marcus Roller, MD 10/21/12 2042

## 2012-10-22 LAB — PATHOLOGIST SMEAR REVIEW

## 2012-10-25 LAB — PATHOLOGIST SMEAR REVIEW

## 2013-06-06 DIAGNOSIS — M0500 Felty's syndrome, unspecified site: Secondary | ICD-10-CM | POA: Insufficient documentation

## 2014-06-06 ENCOUNTER — Ambulatory Visit
Admit: 2014-06-06 | Disposition: A | Discharge status: Home / Self Care | Payer: Self-pay | Attending: Hematology and Oncology | Admitting: Hematology and Oncology

## 2014-06-14 ENCOUNTER — Inpatient Hospital Stay: Payer: Self-pay | Admitting: Internal Medicine

## 2014-06-14 IMAGING — US ABDOMEN ULTRASOUND
1 series · 13 of 25 positions shown · non-contrast
Comparison: CT today.

CLINICAL DATA: Generalized abdominal pain worse over the epigastric
region 1 year. Nausea and vomiting intermittently.

EXAM:
ULTRASOUND ABDOMEN COMPLETE

[Series 1: abdomen ultrasound · 0.20mm/px · 13 of 106 slices shown]
[im 1/106]
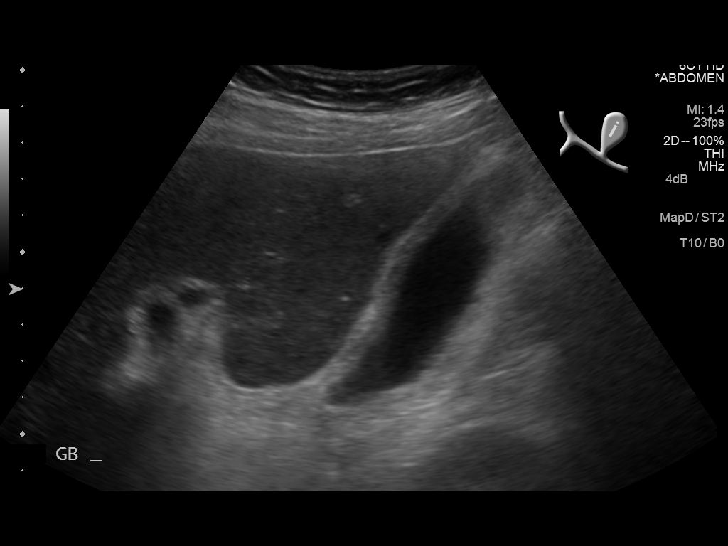
[im 9/106]
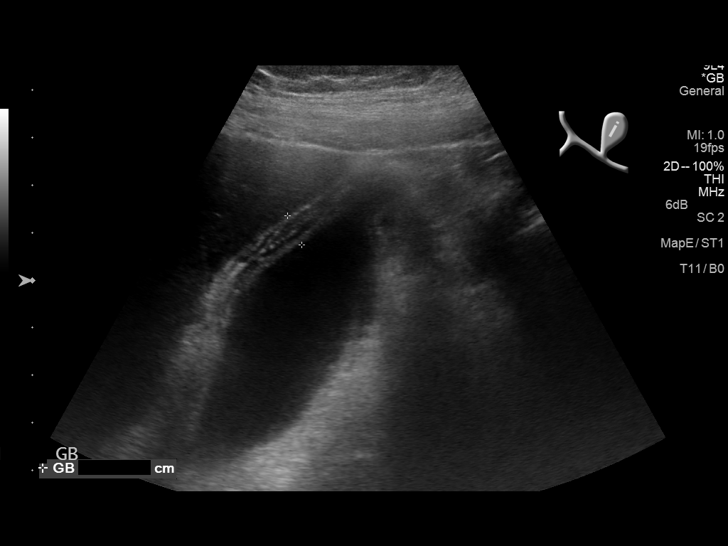
[im 18/106]
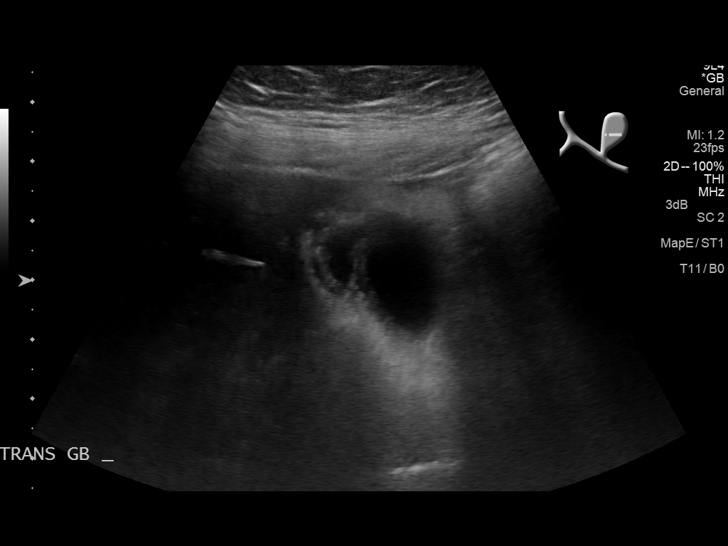
[im 27/106]
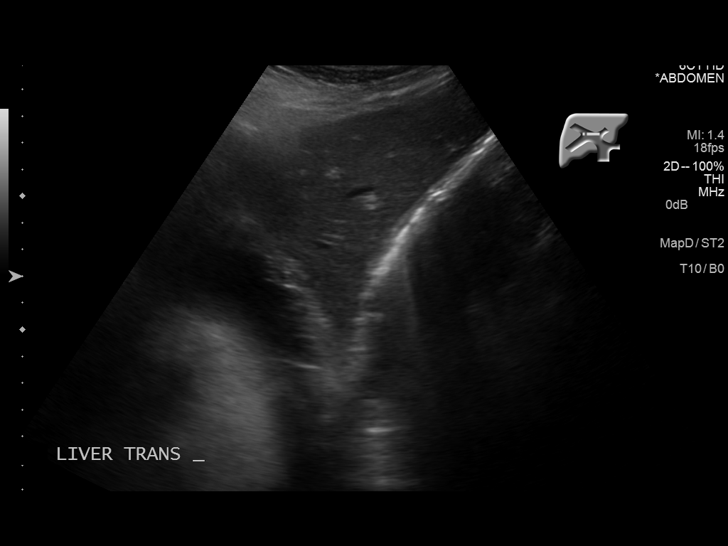
[im 36/106]
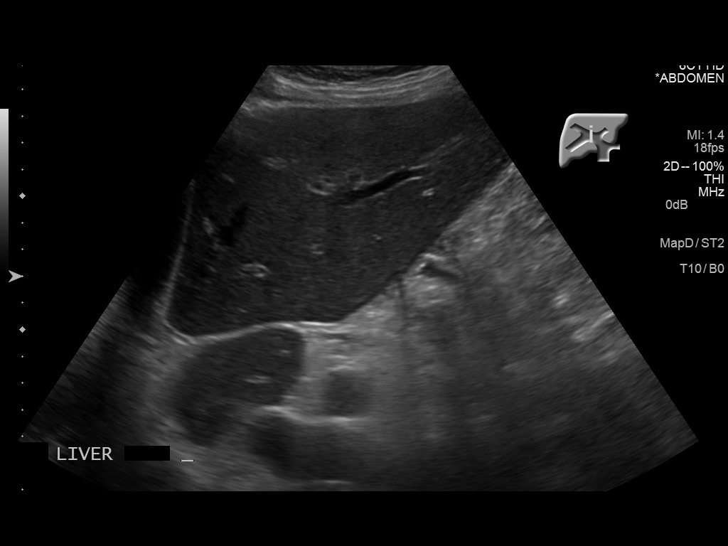
[im 44/106]
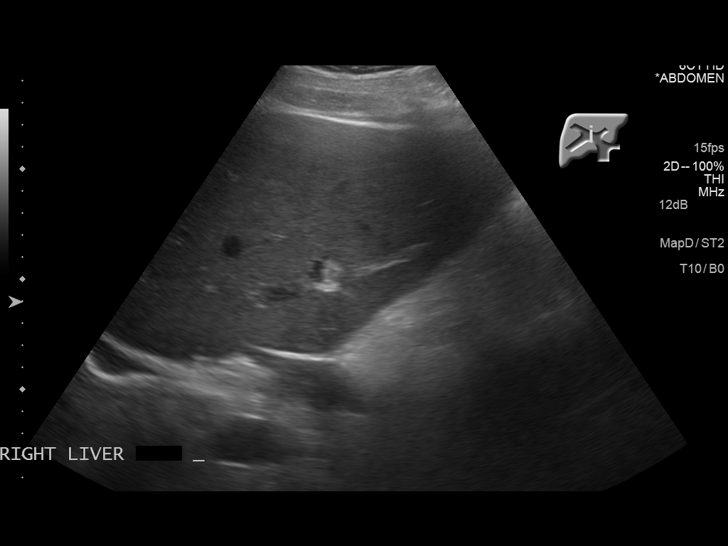
[im 53/106]
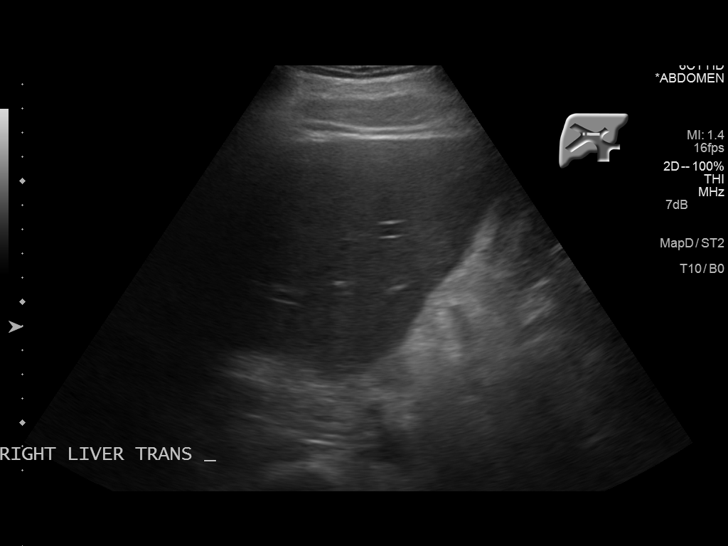
[im 62/106]
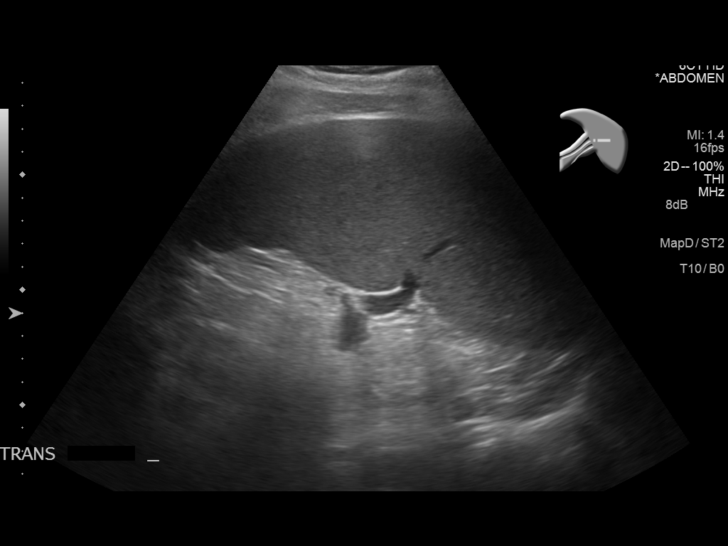
[im 71/106]
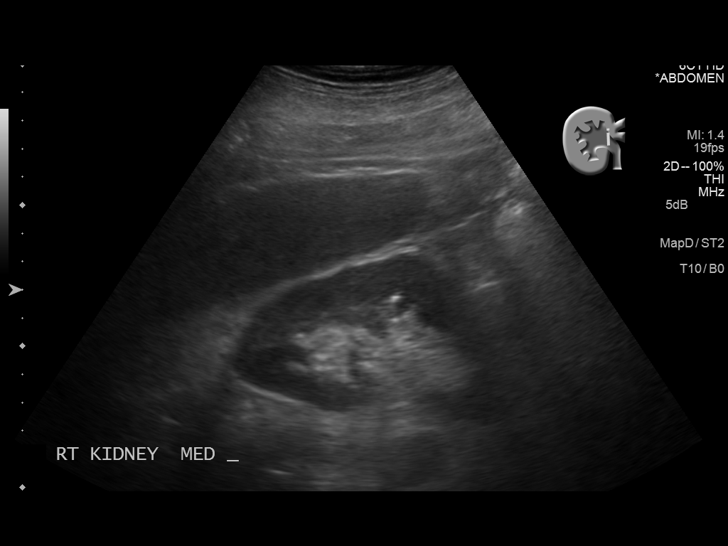
[im 79/106]
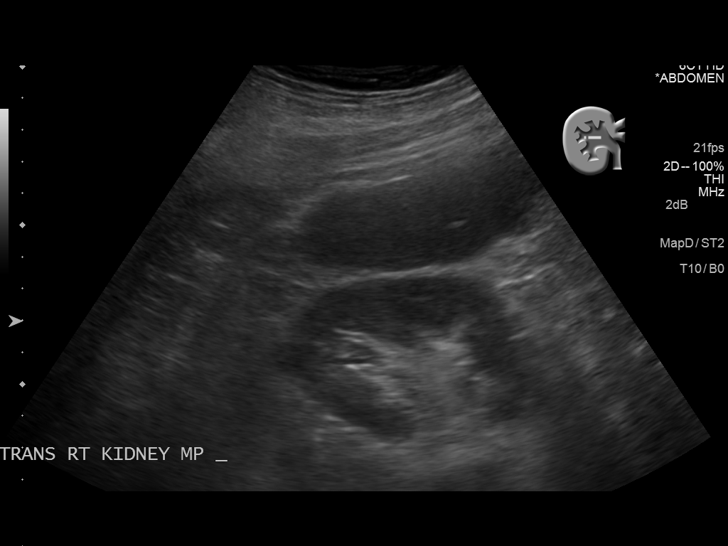
[im 88/106]
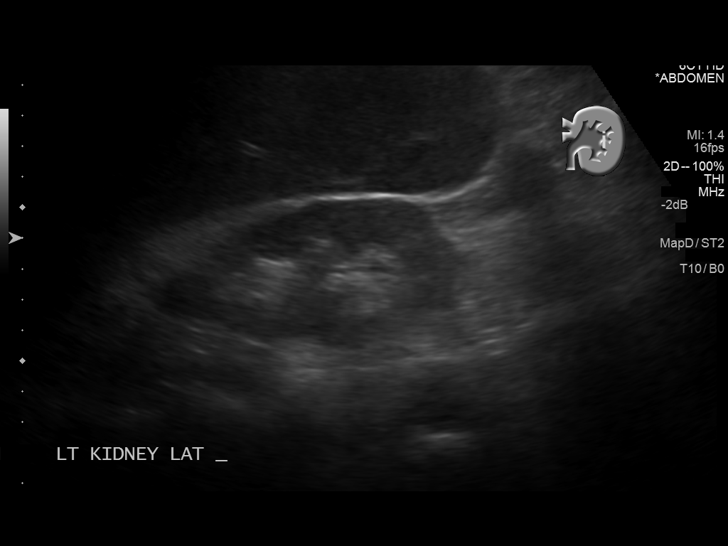
[im 97/106]
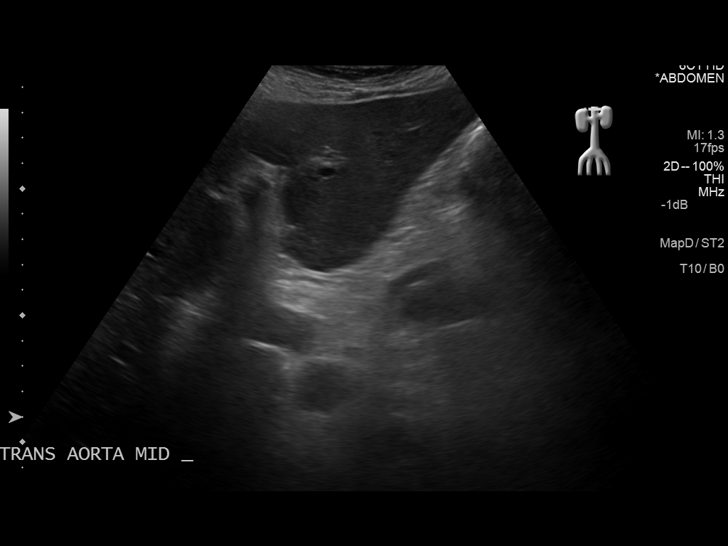
[im 106/106]
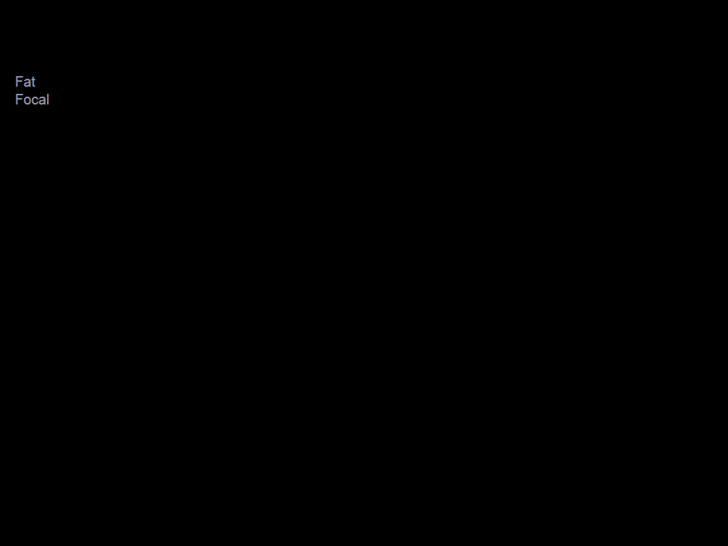

[13 of 25 positions shown; findings below may reference images not displayed]

FINDINGS: Gallbladder: Thickened mildly edematous wall measuring 4.7 x 7.5 mm.
No significant cholelithiasis or sludge. Negative sonographic
Murphy's sign.

Common bile duct: Diameter: 3.5 mm.

Liver: No focal lesion identified. Within normal limits in
parenchymal echogenicity. Normal directional flow within the main
portal vein and hepatic veins.

IVC: Somewhat slow flow is noted which may be due to restrictive
effect of patient's moderate pericardial effusion.

Pancreas: Visualized portion unremarkable.

Spleen: Moderate splenomegaly measuring 18 cm in greatest diameter
(volume [VX] cm3).

Right Kidney: Length: 11.5 cm. Echogenicity within normal limits. No
mass or hydronephrosis visualized. Sub cm cyst over the mid to upper
pole.

Left Kidney: Length: 11.0 cm. Echogenicity within normal limits. No
mass or hydronephrosis visualized.

Abdominal aorta: No aneurysm visualized.

Other findings: Small left pleural effusion.
IMPRESSION: Nonspecific gallbladder wall thickening measuring 4.7-7.5 mm. No
cholelithiasis or sludge and negative sonographic Murphy's sign.
Although this finding can be seen with acalculous cholecystitis, is
more likely related to patient's congestive cardiac state as
evidenced by moderate size pericardial effusion, left pleural
effusion and slow IVC flow.

Moderate splenomegaly.

## 2014-06-14 IMAGING — CR DG CHEST 2V
1 series · 3 of 3 positions shown · non-contrast
Comparison: CT abdomen [DATE]

CLINICAL DATA: onset chest pain since last night, s/p cardiac cath
today, no other known chest history

EXAM:
CHEST  2 VIEW

[Series 1: dxr chest pa (or ap) and lateral · 0.14mm/px · 3 of 3 slices shown]
[im 1/3]
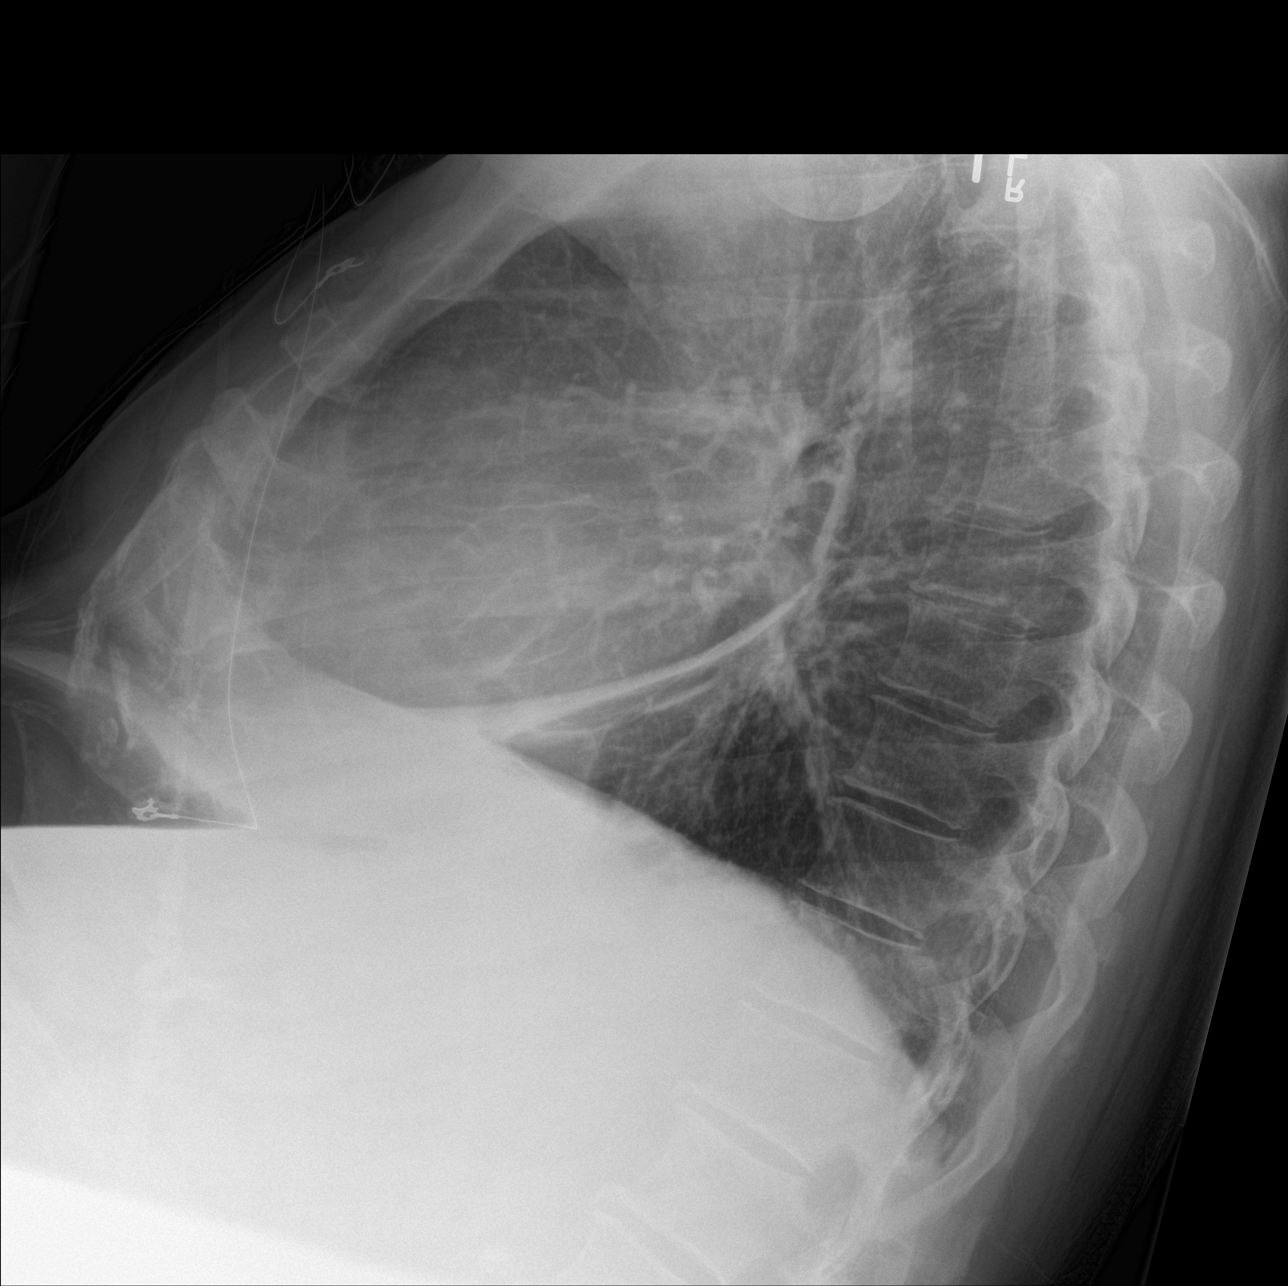
[im 2/3]
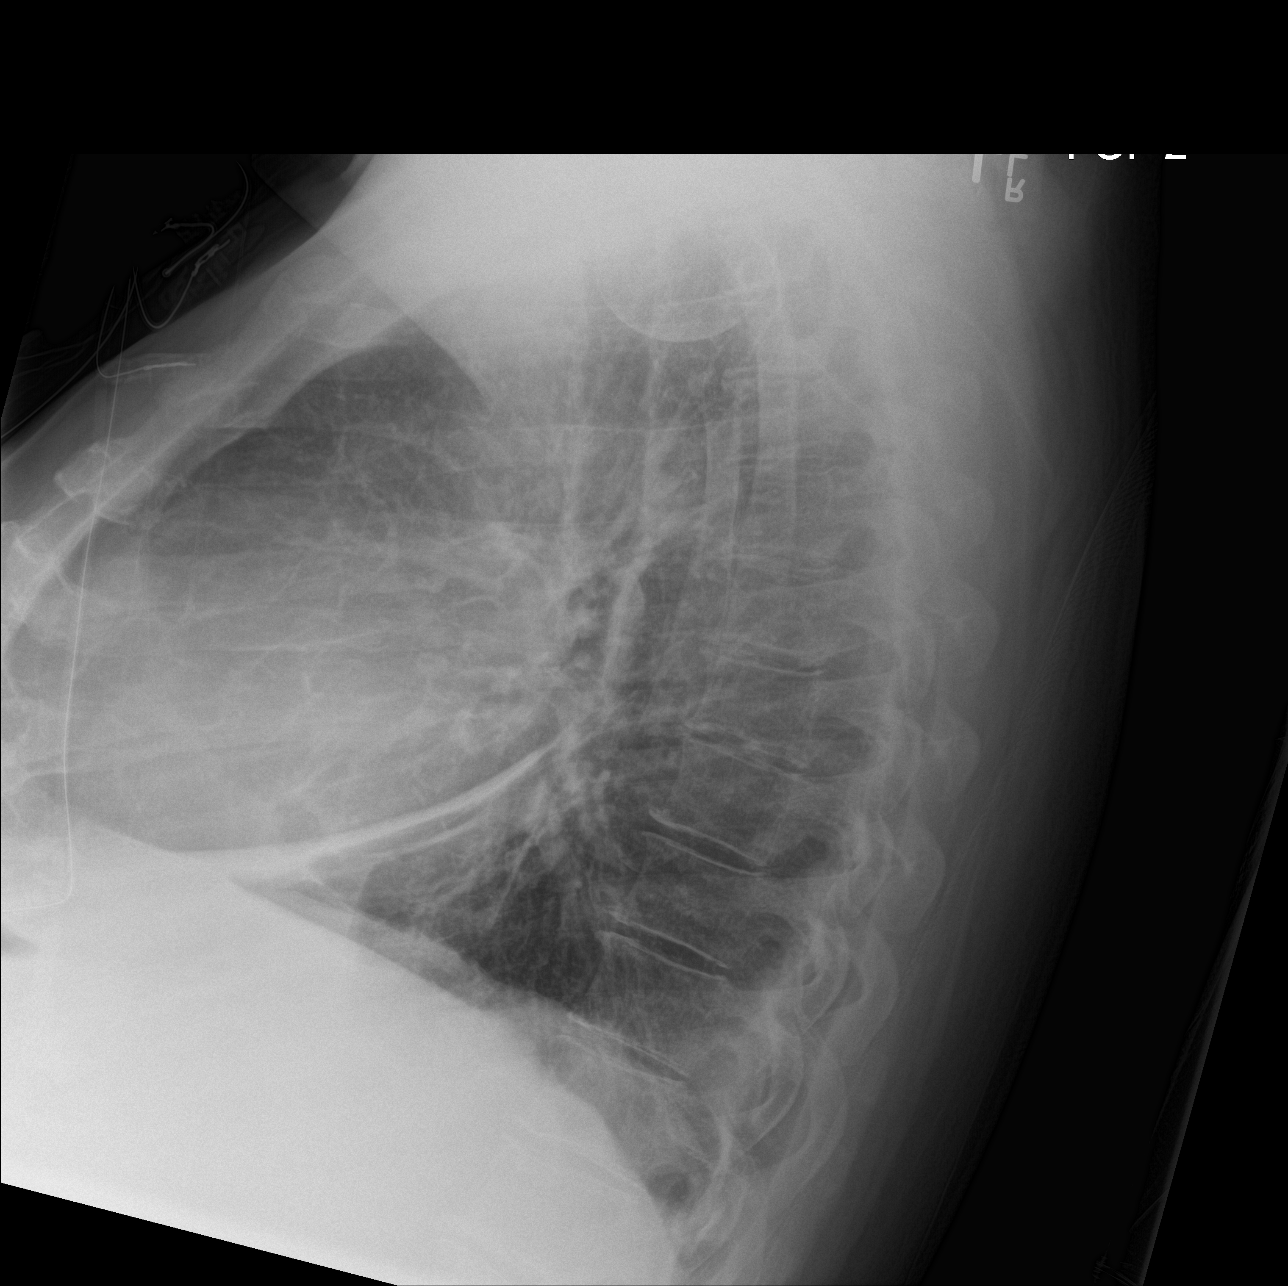
[im 3/3]
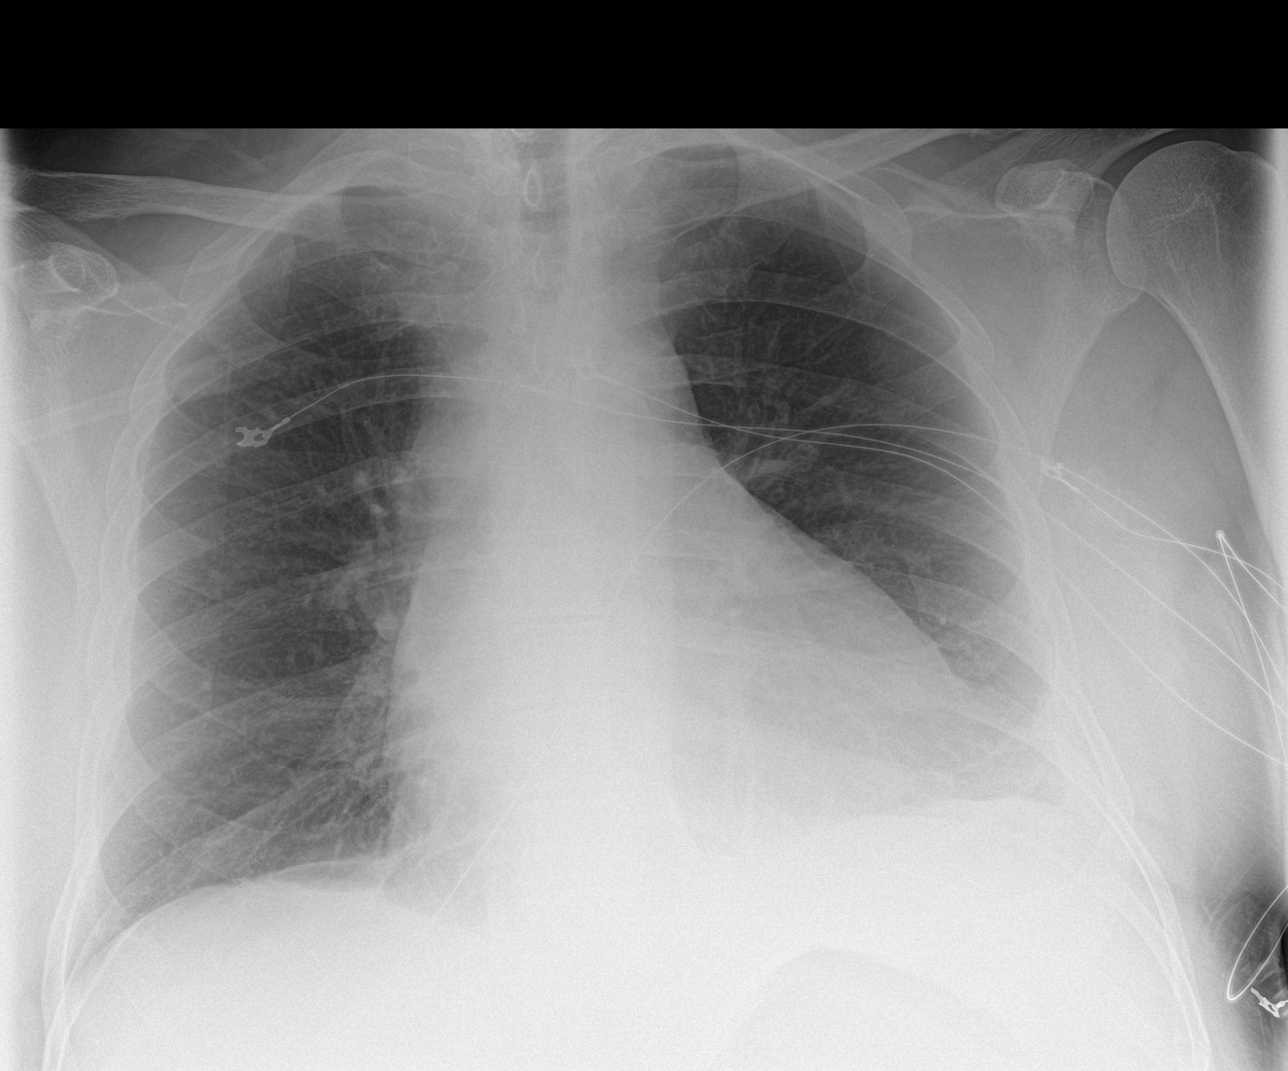

[3 of 3 positions shown; findings below may reference images not displayed]

FINDINGS: Lungs are adequately inflated with evidence of a small left pleural
effusion. Small mild fluid tracking within the left major fissure.
Remainder of the lungs are clear. There is mild to moderate
enlargement of the cardiac silhouette compatible with known
pericardial effusion. There are minimal degenerative changes of the
spine.
IMPRESSION: Small left pleural effusion. Enlarged cardiac silhouette compatible
with known pericardial effusion.

## 2014-06-14 IMAGING — CT CT ABDOMEN W/ CM
2 of 5 series · 15 of 46 positions shown, 17 images · IV contrast (omnipaque)
Comparison: [DATE] abdominal ultrasound.  No prior CT.

CLINICAL DATA: Abdominal pain. Chest pain and diaphoresis. History
of splenomegaly.

EXAM:
CT ABDOMEN WITH CONTRAST
TECHNIQUE: Multidetector CT imaging of the abdomen was performed using the
standard protocol following bolus administration of intravenous
contrast.
CONTRAST:  50 cc Omnipaque 300

[Series 2: routine abd pel with · axial · 0.75mm/px · z∈[-622,-372]mm · 12 of 58 slices shown, 14 images]
[im 4/58  soft-tissue]
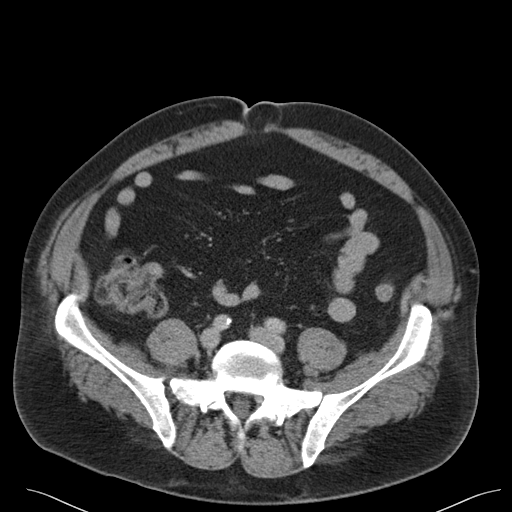
[im 4/58  bone]
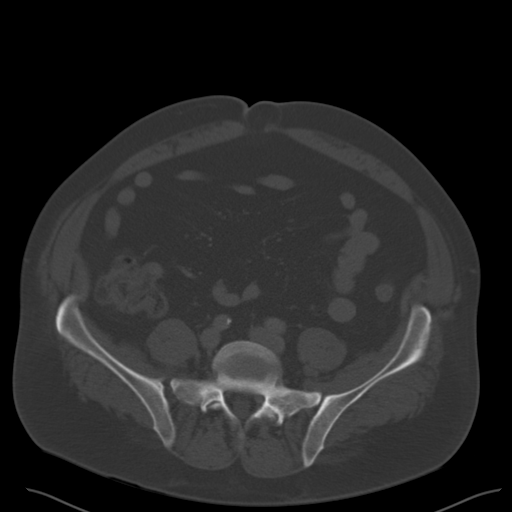
[im 11/58  soft-tissue]
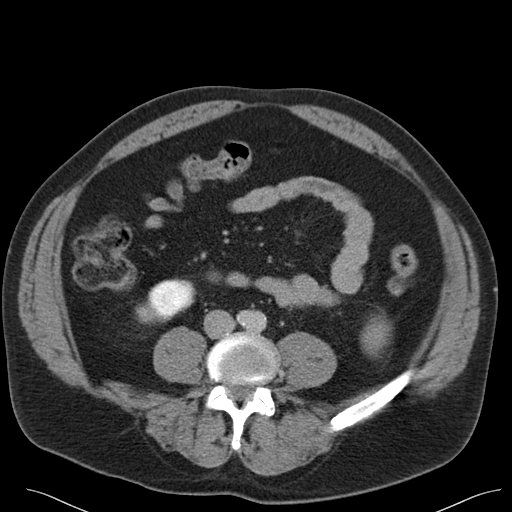
[im 14/58  soft-tissue]
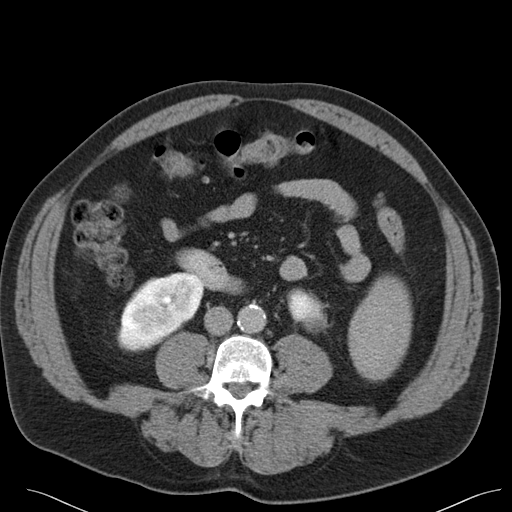
[im 17/58  soft-tissue]
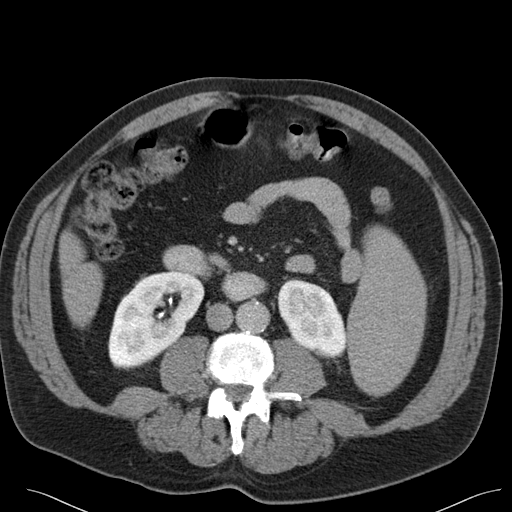
[im 24/58  soft-tissue]
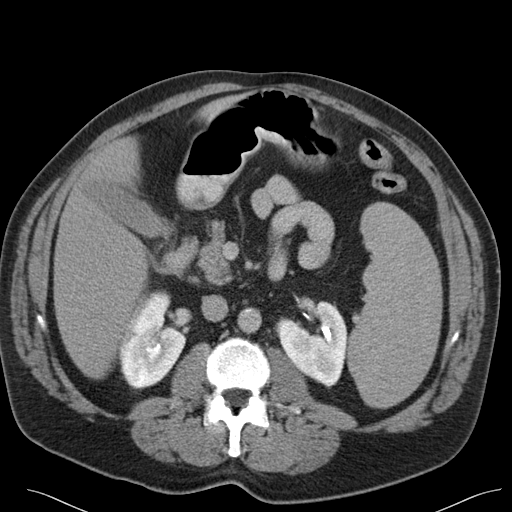
[im 27/58  soft-tissue]
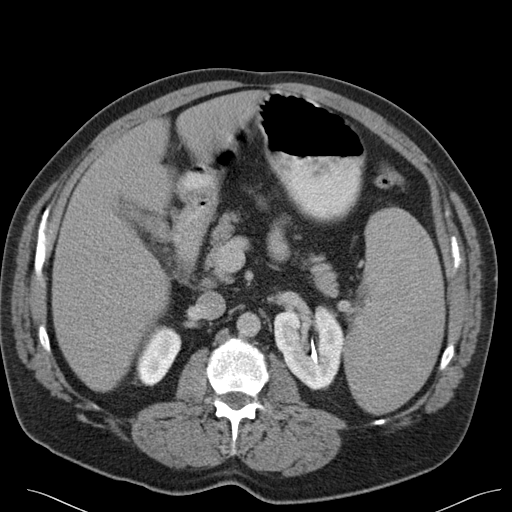
[im 31/58  soft-tissue]
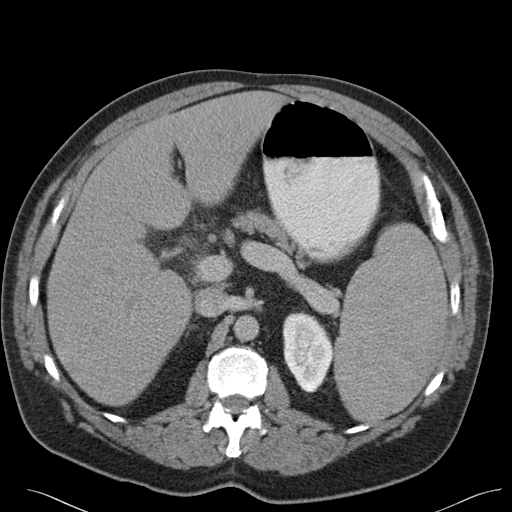
[im 37/58  soft-tissue]
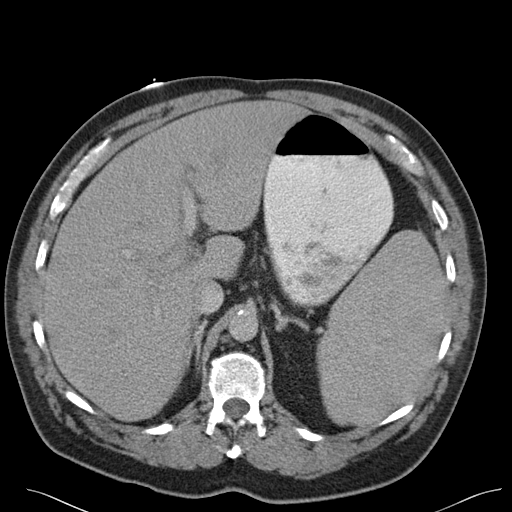
[im 41/58  soft-tissue]
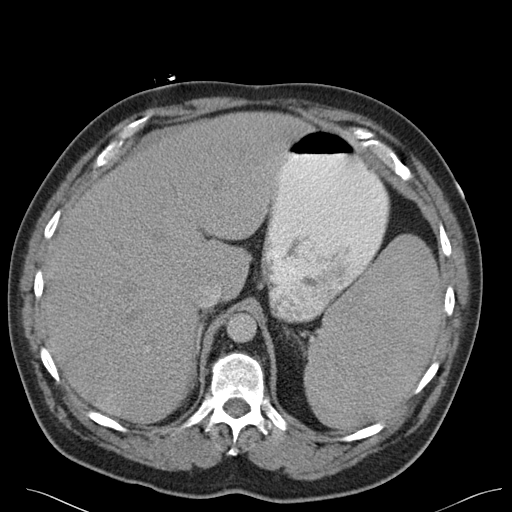
[im 41/58  bone]
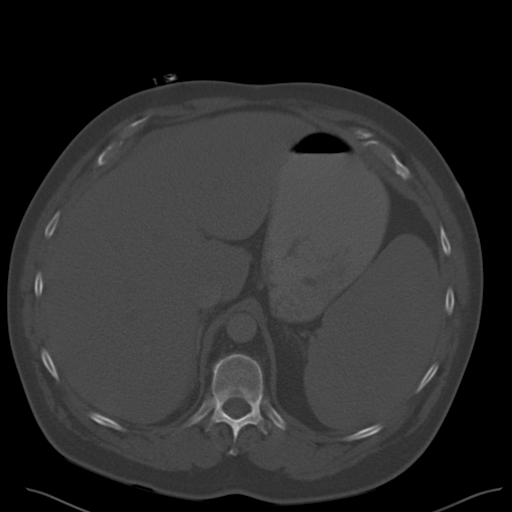
[im 44/58  soft-tissue]
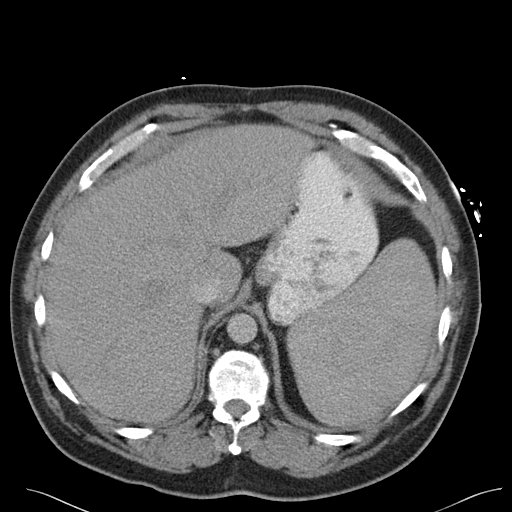
[im 51/58  soft-tissue]
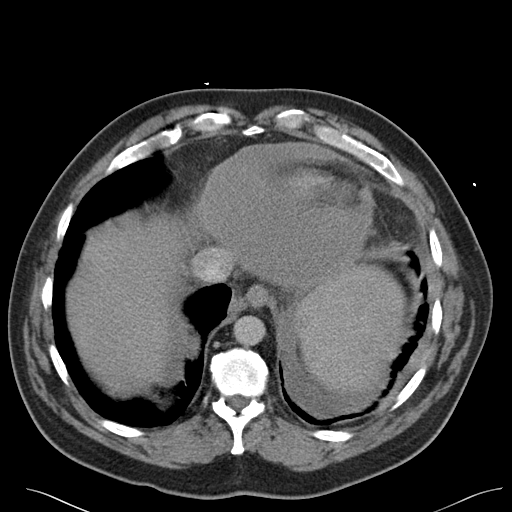
[im 54/58  soft-tissue]
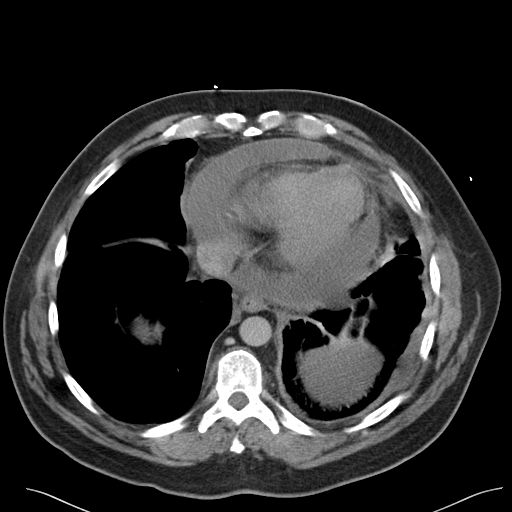

[Series 6: cor routine abd pel with · coronal · 0.58mm/px · 3 of 156 slices shown]
[im 52/156  soft-tissue]
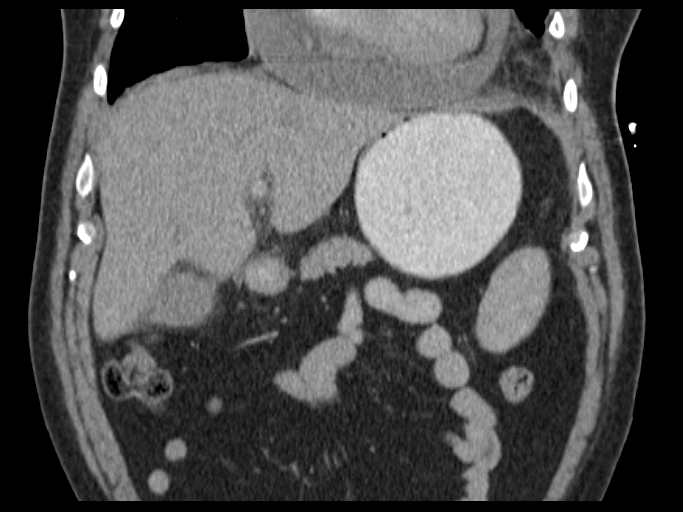
[im 69/156  soft-tissue]
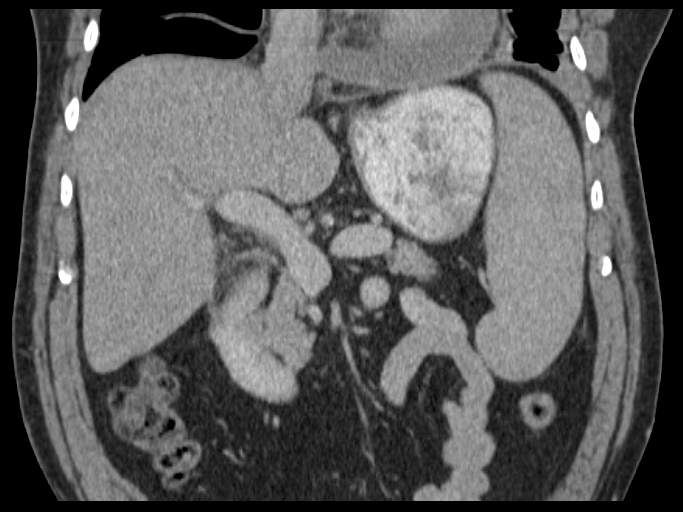
[im 87/156  soft-tissue]
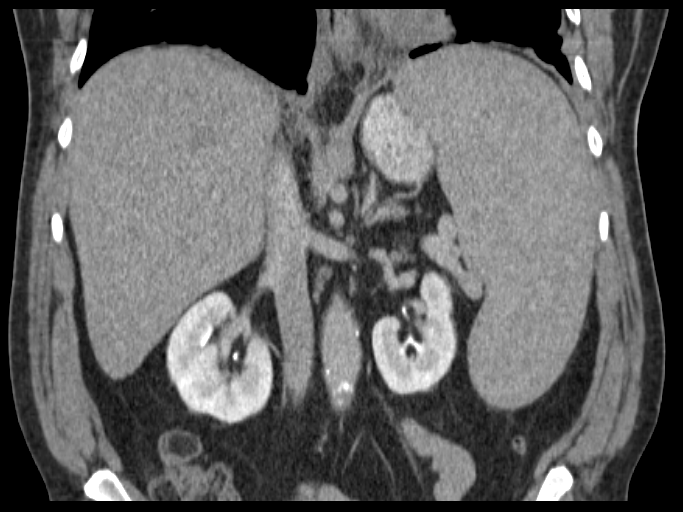

[15 of 46 positions shown; findings below may reference images not displayed]

FINDINGS: Lower chest: Left base volume loss. Small left-sided pleural
effusion. Minimal loculated component versus pleural thickening
anteriorly. Minimal loculated right-sided pleural fluid medially.
Normal heart size. Moderate pericardial effusion with pericardial
thickening, mild.

Hepatobiliary: Normal liver. No calcified gallstones.
Pericholecystic edema is identified, including on images 30-36 of
series 2. No gallbladder wall thickening. No biliary ductal
dilatation.

Pancreas: Normal, without mass or ductal dilatation.

Spleen: Moderate splenomegaly, 19.7 cm craniocaudal. No focal
splenic abnormality.

Adrenals/Urinary Tract: Normal adrenal glands. Too small to
characterize lower pole right renal lesion. Normal left kidney,
without hydronephrosis.

Stomach/Bowel: Normal stomach, without wall thickening. Normal
abdominal portions of the colon. Although the right upper quadrant
edema is identified adjacent to the descending duodenum, No duodenal
wall thickening. Small bowel loops are otherwise unremarkable.

Vascular/Lymphatic: Aortic and branch vessel atherosclerosis. Non
aneurysmal dilatation of the infrarenal abdominal aorta at maximally
2.4 cm. No retroperitoneal or retrocrural adenopathy.

Other: No ascites.  Fat containing ventral abdominal wall hernia.

Musculoskeletal: Disc bulge at the lumbosacral junction.
IMPRESSION: 1. Right upper quadrant edema, centered about the gallbladder. Given
the appearance on ultrasound, acalculus cholecystitis remains a
concern. Although there is a moderate pericardial effusion, other
findings of elevated right heart pressure are not identified.
2. Moderate to marked splenomegaly.
3. Small left pleural effusion with anterior loculation or
thickening. Tiny right sided loculated pleural fluid.
4. Moderate pericardial effusion with pericardial thickening as can
be seen with chronicity or complexity.

## 2014-06-16 IMAGING — RF DG UGI W/O KUB
13 of 18 series · 14 of 20 positions shown · non-contrast
Comparison: CT [DATE].

CLINICAL DATA: Atypical chest pain.  Reflux .

EXAM:
UPPER GI SERIES WITHOUT KUB
TECHNIQUE: Routine upper GI series was performed with thin barium.
FLUOROSCOPY TIME:  Scratch
Fluoroscopy Time (in minutes and seconds):  2 minutes 36 second
Number of Acquired Images:  18

[Series 1: fluoro_barium 2fps_bw · 0.18mm/px · 1 of 1 slices shown (1 of 13)]
[im 1/1]
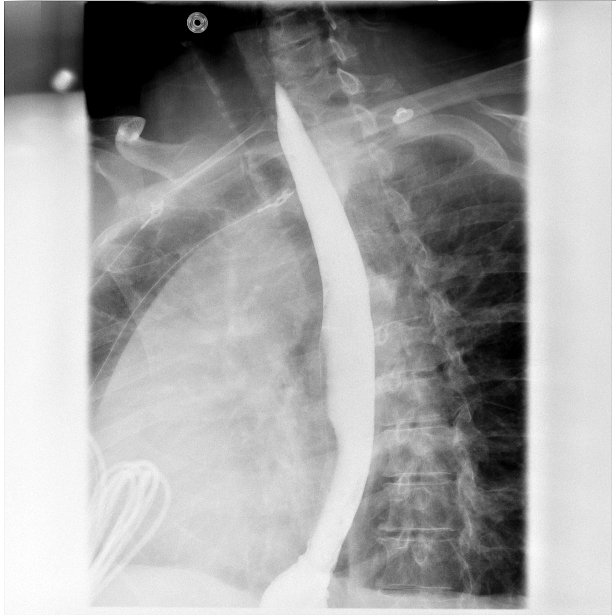

[Series 3: fluoro_barium 2fps_bw · 0.18mm/px · 1 of 1 slices shown (2 of 13)]
[im 1/1]
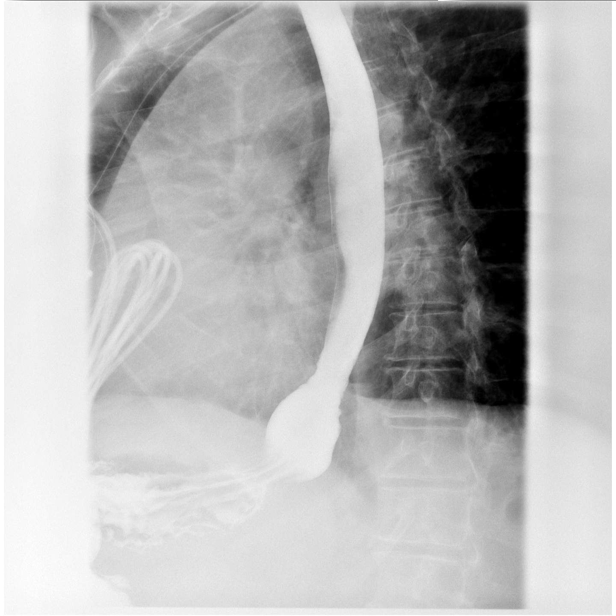

[Series 4: fluoro_barium 2fps_bw · 0.18mm/px · 1 of 1 slices shown (3 of 13)]
[im 1/1]
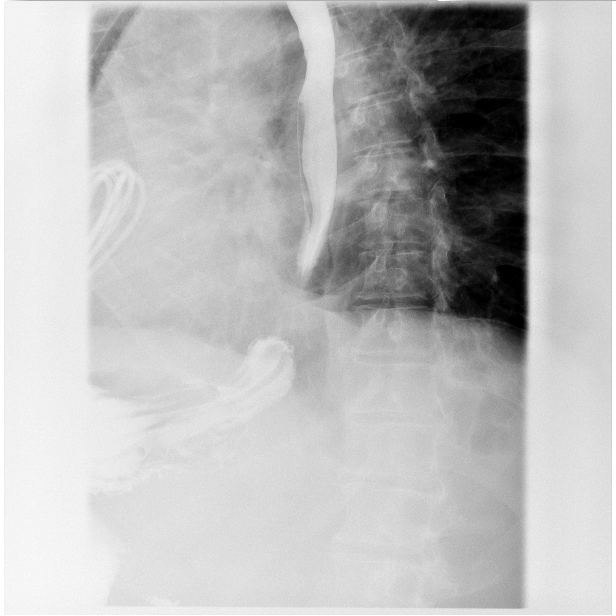

[Series 6: fluoro_barium 2fps_bw · 0.19mm/px · 1 of 1 slices shown (4 of 13)]
[im 1/1]
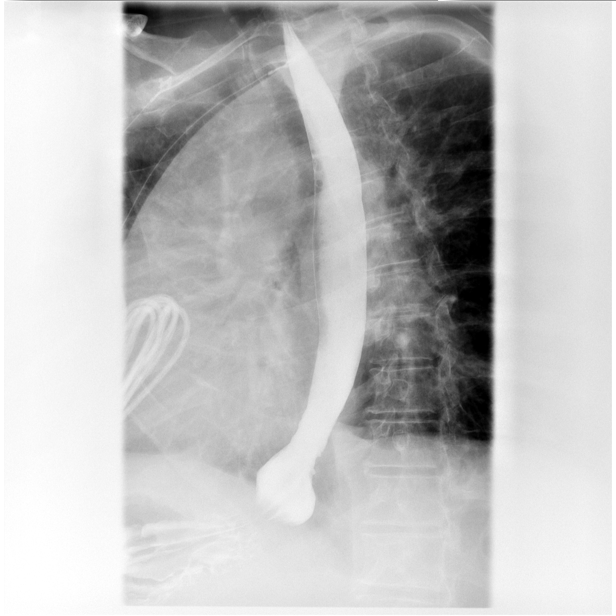

[Series 7: fluoro_barium 2fps_bw · 0.19mm/px · 1 of 1 slices shown (5 of 13)]
[im 1/1]
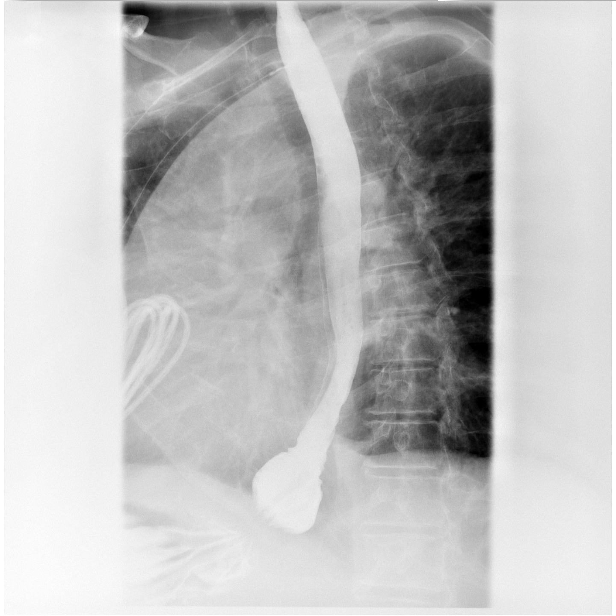

[Series 8: fluoro_barium 2fps_bw · 0.19mm/px · 1 of 1 slices shown (6 of 13)]
[im 1/1]
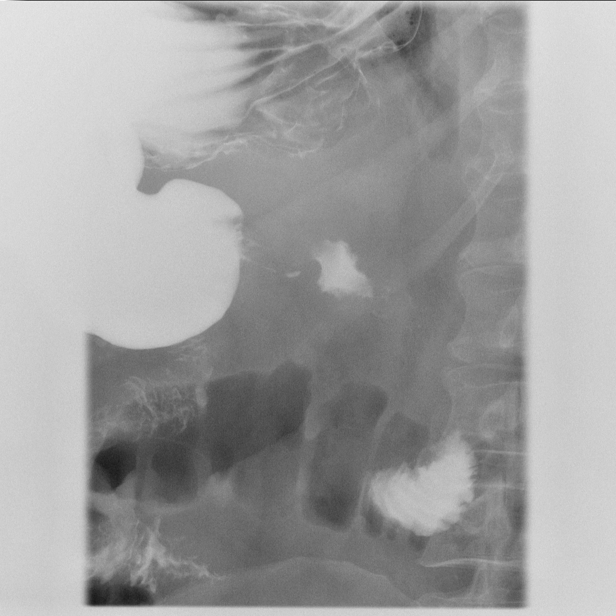

[Series 10: fluoro_barium 2fps_bw · 0.19mm/px · 1 of 1 slices shown (7 of 13)]
[im 1/1]
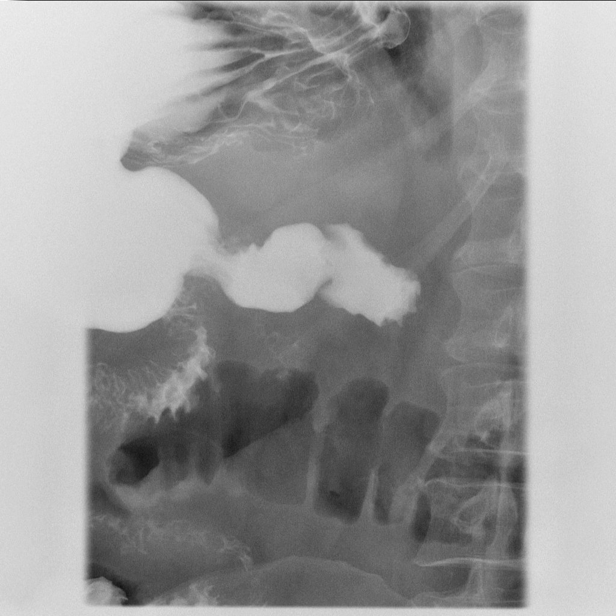

[Series 11: fluoro_barium 2fps_bw · 0.19mm/px · 1 of 1 slices shown (8 of 13)]
[im 1/1]
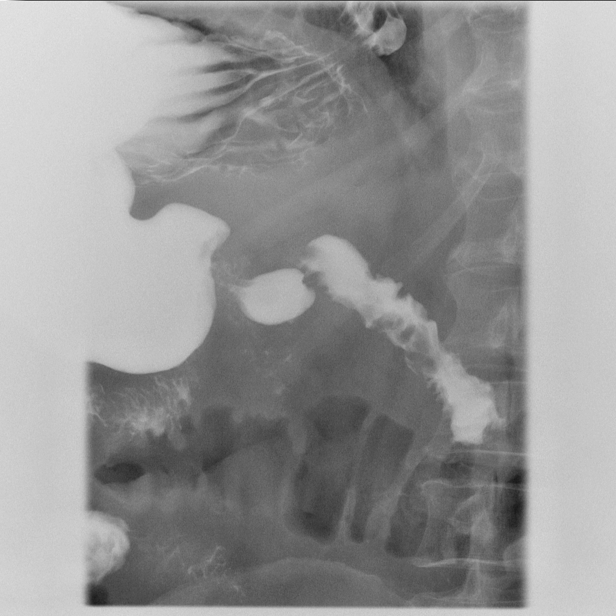

[Series 13: fluoro_barium 2fps_bw · 0.19mm/px · 1 of 1 slices shown (9 of 13)]
[im 1/1]
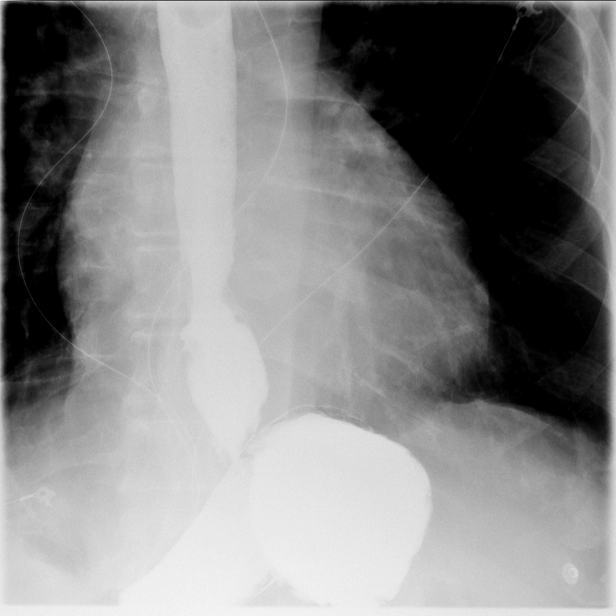

[Series 14: fluoro_barium 2fps_bw · 0.19mm/px · 1 of 1 slices shown (10 of 13)]
[im 1/1]
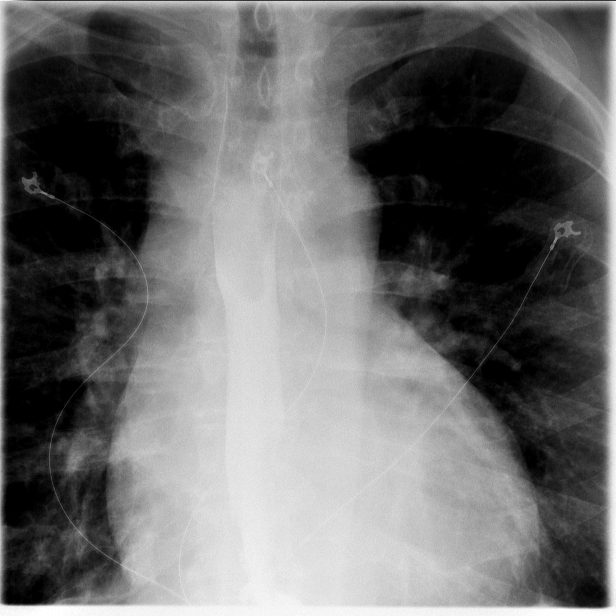

[Series 16: fluoro_barium 2fps_bw · 0.19mm/px · 2 of 2 frames shown (11 of 13)]
[frame 1/2]
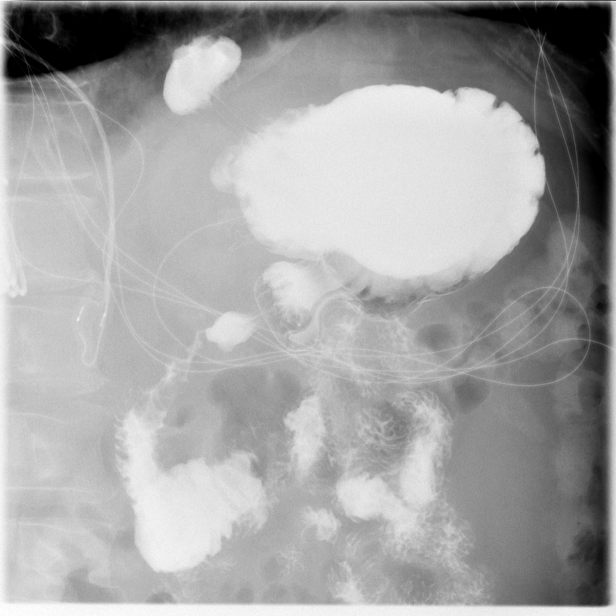
[frame 2/2]
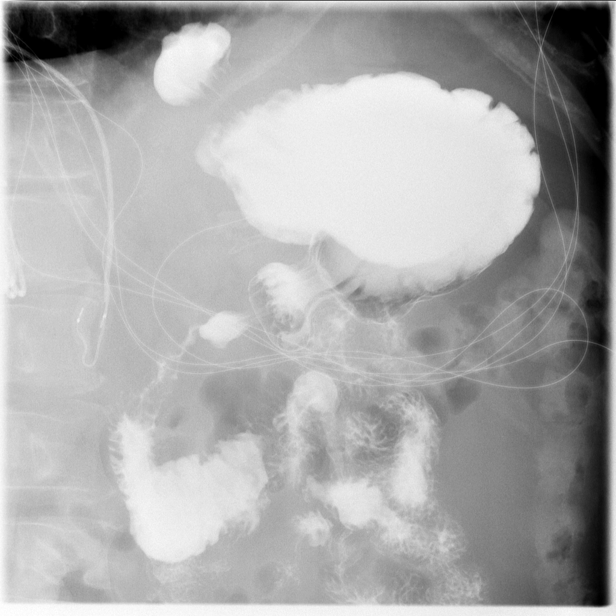

[Series 17: fluoro_barium 2fps_bw · 0.18mm/px · 1 of 2 frames shown (12 of 13)]
[frame 1/2]
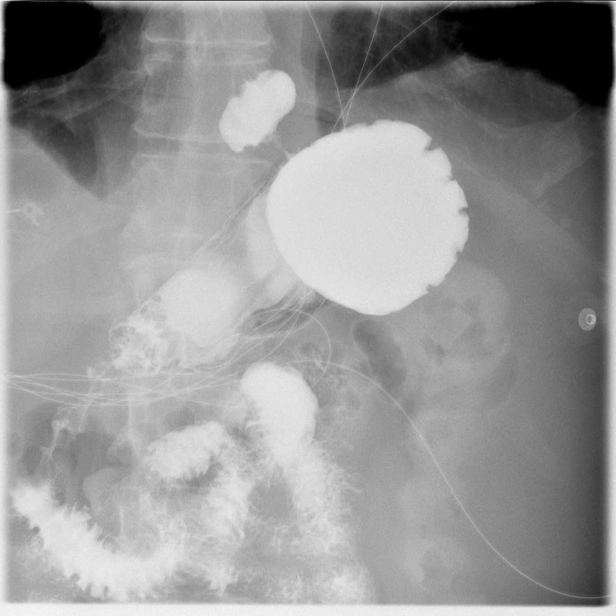

[Series 18: fluoro_barium 2fps_bw · 0.19mm/px · 1 of 1 slices shown (13 of 13)]
[im 1/1]
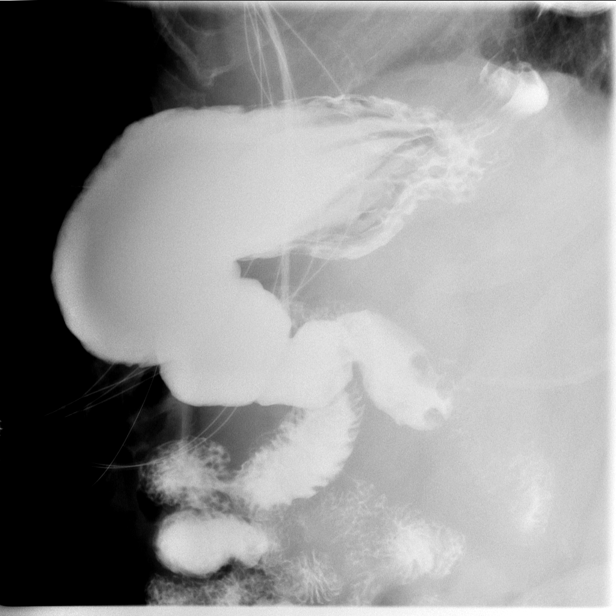

[14 of 20 positions shown; findings below may reference images not displayed]

FINDINGS: Small sliding hiatal hernia. Severe gastroesophageal reflux noted.
Mild irregularity noted in the distal esophagus. This is most likely
from reflux esophagitis. No evidence of obstructing lesion. Stomach
and duodenum appear normal. C-loop is normal.
IMPRESSION: 1. Small sliding hiatal hernia.
2. Severe gastroesophageal reflux.
3. Mild irregularity noted of the distal esophagus, most likely
secondary to reflux esophagitis. Following therapy followup barium
swallow or endoscopy should be obtained to demonstrate resolution to
exclude a fixed ulcerating lesion.

## 2014-07-07 ENCOUNTER — Ambulatory Visit
Admit: 2014-07-07 | Disposition: A | Discharge status: Home / Self Care | Payer: Self-pay | Attending: Hematology and Oncology | Admitting: Hematology and Oncology

## 2014-08-06 NOTE — H&P (Signed)
PATIENT NAME:  Marcus [name], CASTREJON MR#:  [id number] DATE OF BIRTH:  [birth year]  DATE OF ADMISSION:  06/14/2014  PRIMARY CARE PHYSICIAN: None.   OTHER PHYSICIAN: The patient has rheumatologist, Gladys Damme, MD  at [facility].   CHIEF COMPLAINT: Chest pain, abdominal pain today.   HISTORY OF PRESENT ILLNESS: Marcus [name] is a 54 year old obese Caucasian gentleman with history of Felty syndrome, iron-deficiency anemia and known history of splenomegaly, comes to the Emergency Room complaining of chest pain. He was seen in the Emergency Room and thereafter evaluated by cardiology, underwent cardiac catheterization, which was essentially negative without any significant CAD. The patient did have a syncopal episode. Internal medicine thereafter was called for further evaluation and management.   The patient was seen in the specials recovery area, was having a lot of epigastric/upper quadrant abdominal pain along with some chest tightness during my evaluation. He became diaphoretic, received some morphine and felt better. The patient did not have any episodes of vomiting. Denies any history of coronary artery disease in the past. He is being admitted for further evaluation and management.   PAST MEDICAL HISTORY:  1.  Rheumatoid arthritis.  2.  Past medical history of arthritis.  3.  A history of Felty syndrome.  4.  Iron-deficiency anemia.  5.  Enlarged spleen. 6.  History of leukopenia.  7.  History of hiatal hernia/acid reflux.   ALLERGIES: LEVAQUIN.   MEDICATIONS:  1.  Prednisone 10 mg p.o. daily.  2.  Methotrexate 2.5 mg 8 tablets once a week on Tuesday.  3.  Folic acid 1 mg p.o. daily.  4.  Calcium with vitamin D 1 tablet 3 times a day.   FAMILY HISTORY: Positive for hypertension.   SOCIAL HISTORY: Smokes about a pack a day. Denies much alcohol use. He does not work.   REVIEW OF SYSTEMS: CONSTITUTIONAL: No fever, fatigue, weakness.  EYES: No blurred or double vision, glaucoma or  cataracts.  ENT: No tinnitus, ear pain, hearing loss.  RESPIRATORY: No cough, wheeze, hemoptysis or COPD.  CARDIOVASCULAR: Positive for chest pain. Positive for syncopal episode. No arrhythmia or hypertension.  GASTROINTESTINAL: Positive for nausea, GERD, and indigestion. No hematemesis or vomiting. Positive for abdominal pain, epigastric and right upper quadrant.  GENITOURINARY: No dysuria, hematuria, frequency.  ENDOCRINE: No polyuria, nocturia. No thyroid problems.  HEMATOLOGY: Positive for iron-deficiency anemia. Positive for leukopenia.  MUSCULOSKELETAL: Positive for arthritis. No swelling or gout.  NEUROLOGIC: No CVA, TIA, dysarthria.  PSYCHIATRIC: No anxiety or depression.  All other systems reviewed and negative.   PHYSICAL EXAMINATION:  GENERAL: The patient is morbidly obese.   VITAL SIGNS: He is afebrile. Pulse is 91, blood pressure is 107/72, saturations are 95% on room air.  HEENT: Atraumatic, normocephalic. PERRLA, EOM intact. Oral mucosa is moist.  NECK: Supple. No JVD. No carotid bruit.  LUNGS: Clear to auscultation bilaterally. No rales, rhonchi, respiratory distress or labored breathing.  HEART: Both the heart sounds are normal. Rate, rhythm regular. PMI not lateralized. Chest nontender.  EXTREMITIES: Good pedal pulses, good femoral pulses. No lower extremity edema.  ABDOMEN: Obese, soft. There is some tenderness present in the right upper quadrant and epigastric area. No guarding, rigidity. The patient does have significant splenomegaly.  EXTREMITIES: Good pedal pulses, good femoral pulses. No lower extremity edema.  NEUROLOGIC: Grossly intact cranial nerves II-XII. No motor or sensory deficit.  PSYCHIATRIC: The patient is awake, alert, oriented x3.  SKIN: Warm and dry.   LABORATORY AND RADIOLOGIC DATA: Chest x-ray  shows small left pleural effusion and large cardiac silhouette. CT of the abdomen with contrast shows right upper quadrant edema centered around about the  gallbladder. appearance on ultrasound, acalculous cholecystitis remains a concern, moderate to marked splenomegaly. Small pleural effusion with anterior loculation or thickening. Moderate pericardial effusion with pericardial thickening as can be seen in chronicity or complexity.   Cardiac catheterization showed EF 54%, insignificant CAD, no evidence of aortic dissection. Ultrasound of the abdomen shows nonspecific gallbladder wall thickening measuring 4.7 to 7.5 mm, no cholelithiasis or sludge, negative sonographic Murphy sign. Moderate splenomegaly.   H and H is 12.4 and 38.8, white count is 2.1, platelet count is 151,000. PT/INR is 14.9 and 1.2. Creatinine is 1.33, potassium is 4.5.   EKG showed normal sinus rhythm, incomplete right bundle branch block.   ASSESSMENT: [age]-year-old Marcus [name] with history of Felty syndrome along with history of arthritis, morbid obesity and tobacco abuse, comes to the Emergency Room with:  1.  Chest pain. The patient's first set of cardiac enzymes was negative. He was taken to the cardiac catheterization laboratory. He was seen by Dr. Saralyn Pilar. Cardiac catheterization showed insignificant coronary artery disease, ejection fraction was 54%. We will continue p.o. aspirin and we will cycle cardiac enzymes x3. Will also order echocardiogram of the heart.  2.  Upper epigastric abdominal pain along with right upper quadrant pain with history of hiatal hernia and gastroesophageal reflux disease along with increasing symptoms of indigestion lately. Could be related to gallbladder versus peptic ulcer disease. We will start him on p.o. Protonix, IV p.r.n. morphine for abdominal pain, clear liquid diet. I will have gastrointestinal see the patient in consultation. Abdominal ultrasounds shows possible gallbladder wall thickening. We will consider surgical consultation and may be workup as outpatient if the patient remains stable.  3.  Known history of splenomegaly with  history of Felty syndrome. Continue his prednisone and methotrexate.  4.  Tobacco abuse. The patient was advised on smoking cessation, about 4 minutes spent. He is agreeable to use nicotine patch, which were prescribed.  5.  Mild renal insufficiency. Creatinine 1.3 today. The patient did receive IV contrast during cardiac catheterization and CT scan, hence we will continue hydration with IV fluids and check creatinine in the morning.  6.  Deep venous thrombosis prophylaxis. Subcutaneous heparin.  The above was discussed with the patient and the patient's family members.   TIME SPENT: Fifty minutes.    ____________________________ Hart Rochester Posey Pronto, MD sap:TM D: 06/14/2014 14:00:59 ET T: 06/14/2014 15:17:33 ET JOB#: [id number]  cc: Lonney Revak A. Posey Pronto, MD, <Dictator> Gladys Damme, MD Ilda Basset MD ELECTRONICALLY SIGNED 06/17/2014 14:31

## 2014-08-06 NOTE — Consult Note (Signed)
Chief Complaint:  Subjective/Chief Complaint Please see full GI consult and brief consult note.  Patietn seen and examined, chart reviewed. Patient presenting with atypical chest /epigstric pain.  Cardiac cath for abnormal ecg done today without sign cad.   CT showing both evidence of small pericardial effusion and possible cholecystitis.  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   Awaiting echocardiogram, will likley need to have HIDA to rule out cystic duct obstruction prior to consideration of EGD.  Following.   VITAL SIGNS/ANCILLARY NOTES: **Vital Signs.:   09-Mar-16 13:14  Vital Signs Type Admission  Temperature Temperature (F) 98.2  Temperature Source oral  Pulse Pulse 91  Respirations Respirations 20  Systolic BP Systolic BP 073  Diastolic BP (mmHg) Diastolic BP (mmHg) 72  Mean BP 83  Pulse Ox % Pulse Ox % 97  Pulse Ox Activity Level  At rest  Oxygen Delivery Room Air/ 21 %   Brief Assessment:  Cardiac Regular   Respiratory clear BS   Gastrointestinal details normal Nondistended  Bowel sounds normal  No rebound tenderness  mild tenderness ruq/epigastrum   Lab Results: LabObservation:  09-Mar-16 17:58   OBSERVATION Reason for Test  Hepatic:  09-Mar-16 08:35   Bilirubin, Total 1.2 (0.3-1.2 NOTE: New Reference Range  05/30/14)  Bilirubin, Direct 0.5 (0.1-0.5 NOTE: New Reference Range  05/30/14)  Alkaline Phosphatase 59 (38-126 NOTE: New Reference Range  05/30/14)  SGPT (ALT) 20 (17-63 NOTE: New Reference Range  05/30/14)  SGOT (AST) 25 (15-41 NOTE: New Reference Range  05/30/14)  Total Protein, Serum 7.4 (6.5-8.1 NOTE: New Reference Range  05/30/14)  Albumin, Serum  3.1 (3.5-5.0 NOTE: New reference range  05/30/14)  Cardiac Catherization:  09-Mar-16 08:58   Cardiac Catheterization  [facility] [address] [place], [place] [address] [phone]   Cardiovascular Catheterization Comprehensive Report    Patient: Marcus [name] Study date: 06/14/2014 MR number: [id number] Account number: [id number]   DOB: 09/20/1960 Age: 54 years Gender: Male Race: White Height: 70.1 in Weight: 199.1 lb   Interventional Cardiologist:  Miquel Dunn, PhD, MD   SUMMARY:   -CARDIAC STRUCTURES: EF calculated by contrast ventriculography was 54 %.   -Summary: Insignificant CAD, normal LVF, no evidence for aortic dissection.   CORONARY CIRCULATION: The coronary circulation is right dominant. Proximal RCA: There was a tubular 20 % stenosis.   AORTA: There was no evidence for dissection.   VENTRICLES: EF calculated by contrast ventriculography was 54 %.   INDICATIONS: Angina/MI: unstable angina.   HISTORY: No history of previous myocardial infarction. There was no prior diagnosis of congestive heart failure. The patient has a history of current cigarette use. There was no history of cerebrovascular disease, peripheral arterial disease, chronic lung disease, diabetes, dyslipidemia, or hypertension. There was no family history of coronary artery disease. PRIOR CARDIOVASCULAR PROCEDURES: No history of valve surgery, coronary or graft percutaneous intervention, or coronary bypass surgery.   PRIOR DIAGNOSTIC TEST RESULTS: No prior stress test is available.   PROCEDURES PERFORMED: Left heart catheterization with ventriculography. Abdominal aortography. Procedure: Arterial Occlusive Device   COMPLICATIONS: No complication occurred during the cath lab visit.   PROCEDURE: The risks and alternatives of the procedures and conscious sedation were explained to the patient and informedconsent was obtained. The patient was brought to the cath lab and placed on the table. The planned puncture sites were prepped and draped in the usual sterile fashion.   -Right femoral artery access. The vessel was accessed, a wire was  threadedinto the vessel, and a was advanced over the wire into the vessel.   -Left  heart catheterization. A catheter was advanced to the ascending aorta. Ventriculography was performed using power injection of contrast agent.   -Abdominal aortography.A catheter was placed and contrast was injected.   -Arterial Occlusive Device.   PROCEDURE COMPLETION: TIMING: Test started at 09:05. Test concluded at 09:30. RADIATION EXPOSURE: Fluoroscopy time: 4.25 min. Fluoroscopy dose: 1.577 Gray. MEDICATIONS GIVEN: Fentanyl, 50 mcg, IV, at 09:10. Midazolam, 1 mg, IV, at 09:10. CONTRAST GIVEN: Isovue 160 ml.   Prepared and signed by   Miquel Dunn, PhD, MD Signed 06/14/2014 09:35:15   STUDY DIAGRAM   Angiographic findings Native coronary lesions:  Proximal RCA: Lesion 1: tubular, 20 % stenosis.   HEMODYNAMIC TABLES   Pressures:  Baseline Pressures:  - HR: 120 Pressures:  - Rhythm: Pressures:  -- Aortic Pressure (S/D/M): 95/69/78 Pressures:  -- Left Ventricle (s/edp): 89/28/--   Outputs:  Baseline Outputs:  -- CALCULATIONS[age] Outputs:  -- CALCULATIONS: Body Surface Area: 2.09 Outputs:  -- CALCULATIONS: Height in cm: 178.00 Outputs:  -- CALCULATIONS: Sex: Male Outputs:  -- CALCULATIONS: Weight inkg: 90.50  Cardiology:  09-Mar-16 10:56   Ventricular Rate 109  Atrial Rate 109  P-R Interval 128  QRS Duration 76  QT 328  QTc 441  P Axis 48  R Axis -19  T Axis 59  ECG interpretation Sinus tachycardia Nonspecific ST abnormality Abnormal ECG When compared with ECG of 05-Feb-2005 10:08, Incomplete right bundle branch block is no longer Present Confirmed by Ronnald Collum, SAM ([id number]) on 06/14/2014 3:01:15 PM  Overreader: Hipolito Bayley  Routine Chem:  09-Mar-16 08:35   Hemoglobin A1c ([facility]) 4.4 (4.0-6.0 NOTE: New Reference Range  05/30/14)  Glucose, Serum  206 (65-99 NOTE: New Reference Range  05/30/14)  BUN  23 (6-20 NOTE: New Reference Range  05/30/14)  Creatinine (comp)  1.33 (0.61-1.24 NOTE: New Reference Range  05/30/14)  Sodium, Serum  135 (135-145 NOTE: New Reference Range  05/30/14)  Potassium, Serum 4.5 (3.5-5.1 NOTE: New Reference Range  05/30/14)  Chloride, Serum 102 (101-111 NOTE: New Reference Range  05/30/14)  CO2, Serum 23 (22-32 NOTE: New Reference Range  05/30/14)  Calcium (Total), Serum  8.0 (8.9-10.3 NOTE: New Reference Range  05/30/14)  Anion Gap 10  eGFR (African American) >60  eGFR (Non-African American) >60 (eGFR values <43m/min/1.73 m2 may be an indication of chronic kidney disease (CKD). Calculated eGFR is useful in patients with stable renal function. The eGFR calculation will not be reliable in acutely ill patients when serum creatinine is changing rapidly. It is not useful in patients on dialysis. The eGFR calculation may not be applicable to patients at the low and high extremes of body sizes, pregnant women, and vegetarians.)    14:52   Result Comment - TROP CALLED TO MATTIE HIMES  - @ 118293/9/16 SM[facility]  - READ-BACK PERFORMED  Result(s) reported on 14 Jun 2014 at 04:21PM.    16:24   Result Comment - TROP P/C 06/14/14 '@1616'  [facility] BY TFK  - PREVIOUSLY CALLED  Result(s) reported on 14 Jun 2014 at 05:11PM.  Cardiac:  09-Mar-16 08:35   CPK-MB, Serum 0.7 (0.5-5.0 NOTE: New Reference Range  05/30/14)  Troponin I <0.03 (0.00-0.03 0.03 ng/mL or less: NEGATIVE  Repeat testing in 3-6 hrs  if clinically indicated. >0.03 ng/mL: POTENTIAL  MYOCARDIAL INJURY. Repeat  testing in 3-6 hrs if  clinically indicated. NOTE: An increase or decrease  of  30% or more on serial  testing suggests a  clinically important change NOTE: New Reference Range  05/30/14)    14:52   CPK-MB, Serum 1.2 (0.5-5.0 NOTE: New Reference Range  05/30/14)  Troponin I  0.06 (0.00-0.03 0.03 ng/mL or less: NEGATIVE  Repeat testing in 3-6 hrs  if clinically indicated. >0.03 ng/mL: POTENTIAL  MYOCARDIAL INJURY. Repeat  testing in 3-6 hrs if  clinically indicated. NOTE: An increase or decrease  of 30% or more on  serial  testing suggests a  clinically important change NOTE: New Reference Range  05/30/14)    16:24   CPK-MB, Serum 1.3 (0.5-5.0 NOTE: New Reference Range  05/30/14)  Troponin I  0.07 (0.00-0.03 0.03 ng/mL or less: NEGATIVE  Repeat testing in 3-6 hrs  if clinically indicated. >0.03 ng/mL: POTENTIAL  MYOCARDIAL INJURY. Repeat  testing in 3-6 hrs if  clinically indicated. NOTE: An increase or decrease  of 30% or more on serial  testing suggests a  clinically important change NOTE: New Reference Range  05/30/14)  Routine Coag:  09-Mar-16 08:35   Prothrombin 14.9 (11.4-15.0 NOTE: New Reference Range  05/05/14)  INR 1.2 (INR reference interval applies to patients on anticoagulant therapy. A single INR therapeutic range for coumarins is not optimal for all indications; however, the suggested range for most indications is 2.0 - 3.0. Exceptions to the INR Reference Range may include: Prosthetic heart valves, acute myocardial infarction, prevention of myocardial infarction, and combinations of aspirin and anticoagulant. The need for a higher or lower target INR must be assessed individually. Reference: The Pharmacology and Management of the Vitamin K  antagonists: the seventh ACCP Conference on Antithrombotic and Thrombolytic Therapy. WUJWJ.1914 Sept:126 (3suppl): N9146842. A HCT value >55% may artifactually increase the PT.  In one study,  the increase was an average of 25%. Reference:  "Effect on Routine and Special Coagulation Testing Values of Citrate Anticoagulant Adjustment in Patients with High HCT Values." American Journal of Clinical Pathology 2006;126:400-405.)  Activated PTT (APTT) 33.6 (A HCT value >55% may artifactually increase the APTT. In one study, the increase was an average of 19%. Reference: "Effect on Routine and Special Coagulation Testing Values of Citrate Anticoagulant Adjustment in Patients with High HCT Values." American Journal of Clinical  Pathology 2006;126:400-405.)  Routine Hem:  09-Mar-16 08:35   WBC (CBC)  2.1  RBC (CBC)  4.36  Hemoglobin (CBC)  12.4  Hematocrit (CBC)  38.8  Platelet Count (CBC) 151  MCV 89  MCH 28.4  MCHC  31.9  RDW  16.5  Neutrophil % 47.2  Lymphocyte % 23.9  Monocyte % 27.4  Eosinophil % 1.0  Basophil % 0.5  Neutrophil #  1.0  Lymphocyte #  0.5  Monocyte # 0.6  Eosinophil # 0.0  Basophil # 0.0 (Result(s) reported on 14 Jun 2014 at 09:21AM.)   Radiology Results: XRay:    09-Mar-16 12:47, Chest PA and Lateral  Chest PA and Lateral   REASON FOR EXAM:    chest pain  COMMENTS:       PROCEDURE: DXR - DXR CHEST PA (OR AP) AND LATERAL  - Jun 14 2014 12:47PM     CLINICAL DATA:  onset chest pain since last night, s/p cardiac cath  today, no other known chest history    EXAM:  CHEST  2 VIEW    COMPARISON:  CT abdomen 06/14/2014    FINDINGS:  Lungs are adequately inflated with evidence of a small left pleural  effusion. Small mild fluid  tracking within the left major fissure.  Remainder of the lungs are clear. There is mild to moderate  enlargement of the cardiac silhouette compatible with known  pericardial effusion. There are minimal degenerative changes of the  spine.     IMPRESSION:  Small left pleural effusion. Enlarged cardiac silhouette compatible  with known pericardial effusion.      Electronically Signed    By: Marin Olp M.D.    On: 06/14/2014 12:57     Verified By: Pearletha Alfred, M.D.,  [place]:    09-Mar-16 12:14, US Abdomen General Survey  US Abdomen General Survey   REASON FOR EXAM:    abd pain  COMMENTS:       PROCEDURE: [place]  - US ABDOMEN GENERAL SURVEY  - Jun 14 2014 12:14PM     CLINICAL DATA:  Generalized abdominal pain worse over the epigastric  region 1 year. Nausea and vomiting intermittently.    EXAM:  ULTRASOUND ABDOMEN COMPLETE    COMPARISON:  CT today.    FINDINGS:  Gallbladder: Thickened mildly edematous wall measuring 4.7 x 7.5 mm.  No  significant cholelithiasis or sludge. Negative sonographic  Murphy's sign.    Common bile duct: Diameter: 3.5 mm.    Liver: No focal lesion identified. Within normal limits in  parenchymal echogenicity. Normal directional flow within the main  portal vein and hepatic veins.    IVC: Somewhat slow flow is noted which may be due to restrictive  effect of patient's moderate pericardial effusion.    Pancreas: Visualized portion unremarkable.    Spleen: Moderate splenomegaly measuring 18 cm in greatest diameter  (volume 1147 cm3).    Right Kidney: Length: 11.5 cm. Echogenicity within normal limits. No  mass or hydronephrosis visualized. Sub cm cyst over the mid to upper  pole.    Left Kidney: Length: 11.0 cm. Echogenicity within normal limits. No  mass or hydronephrosis visualized.    Abdominal aorta: No aneurysm visualized.    Other findings: Small left pleural effusion.     IMPRESSION:  Nonspecific gallbladder wall thickening measuring 4.7-7.5 mm. No  cholelithiasis or sludge and negative sonographic Murphy's sign.  Although this finding can be seen with acalculous cholecystitis, is  more likely related to patient's congestive cardiac state as  evidenced by moderate size pericardial effusion, left pleural  effusion and slow IVC flow.    Moderate splenomegaly.      Electronically Signed    By: Marin Olp M.D.    On: 06/14/2014 12:33         Verified By: Pearletha Alfred, M.D.,  CT:    09-Mar-16 12:25, CT Abdomen With Contrast  CT Abdomen With Contrast   REASON FOR EXAM:    abd pain, chest pain and diaphoresis. h/o   splenomegaly;    NOTE: Nursing to Give  COMMENTS:       PROCEDURE: CT  - CT ABDOMEN STANDARD W  - Jun 14 2014 12:25PM     CLINICAL DATA:  Abdominal pain. Chest pain and diaphoresis. History  of splenomegaly.    EXAM:  CT ABDOMEN WITH CONTRAST    TECHNIQUE:  Multidetector CT imaging of the abdomen was performed using the  standard protocol  following bolus administration of intravenous  contrast.    CONTRAST:  50 cc Omnipaque 300    COMPARISON:  06/14/2014 abdominal ultrasound.  No prior CT.    FINDINGS:  Lower chest: Left base volume loss. Small left-sided pleural  effusion. Minimal loculated  component versus pleural thickening  anteriorly. Minimal loculated right-sided pleural fluid medially.  Normal heart size. Moderate pericardial effusion with pericardial  thickening, mild.    Hepatobiliary: Normal liver. No calcified gallstones.  Pericholecystic edema is identified, including on images 30-36 of  series 2. No gallbladder wall thickening. No biliary ductal  dilatation.    Pancreas: Normal, without mass or ductal dilatation.    Spleen: Moderate splenomegaly, 19.7 cm craniocaudal. No focal  splenic abnormality.    Adrenals/Urinary Tract: Normal adrenal glands. Too small to  characterize lower pole right renal lesion. Normal left kidney,  without hydronephrosis.    Stomach/Bowel: Normal stomach, without wall thickening. Normal  abdominal portions of the colon. Although the right upper quadrant  edema is identified adjacent to the descending duodenum, No duodenal  wall thickening. Small bowel loops are otherwise unremarkable.    Vascular/Lymphatic: Aortic and branch vessel atherosclerosis. Non  aneurysmal dilatation of the infrarenal abdominal aorta at maximally  2.4 cm. No retroperitoneal or retrocrural adenopathy.    Other: No ascites.  Fat containing ventral abdominal wall hernia.    Musculoskeletal: Disc bulge at the lumbosacral junction.     IMPRESSION:  1. Right upper quadrant edema, centered about the gallbladder. Given  the appearance on ultrasound, acalculus cholecystitis remains a  concern. Although there is a moderate pericardial effusion, other  findings of elevated right heart pressure are not identified.  2. Moderate to marked splenomegaly.  3.Small left pleural effusion with anterior  loculation or  thickening. Tiny right sided loculated pleural fluid.  4. Moderate pericardial effusion with pericardial thickening as can  be seen with chronicity or complexity.      Electronically Signed    By: Abigail Miyamoto M.D.    On: 06/14/2014 12:45         Verified By: Areta Haber, M.D.,   Assessment/Plan:  Assessment/Plan:  Assessment as above.   Electronic Signatures: Loistine Simas (MD)  (Signed 09-Mar-16 19:35)  Authored: Chief Complaint, VITAL SIGNS/ANCILLARY NOTES, Brief Assessment, Lab Results, Radiology Results, Assessment/Plan   Last Updated: 09-Mar-16 19:35 by Loistine Simas (MD)

## 2014-08-06 NOTE — Discharge Summary (Signed)
PATIENT NAME:  Marcus [name], Marcus [name] MR#:  [id number] DATE OF BIRTH:  1960-09-23  DATE OF ADMISSION:  06/14/2014 DATE OF DISCHARGE:  06/19/2014  ADMITTING PHYSICIAN:  Fritzi Mandes, MD.    DISCHARGING PHYSICIAN: Gladstone Lighter, MD.   PRIMARY CARE PHYSICIAN: None.   PRIMARY RHEUMATOLOGIST:  Dr[facility] Tawanna [facility] at [facility].    PRIMARY HEMATOLOGIST:  Dr. Sterling Big at [facility].   DISCHARGE DIAGNOSES:  1. 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  2. Rheumatoid arthritis.  3. 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  5. Chronic anemia.  6. Pericardial effusion without any evidence for pericarditis.   DISCHARGE HOME MEDICATIONS:  1. Prednisone 30 mg p.o. daily.  2. Folic acid 1 mg p.o. daily.  3. Calcium/vitamin D 600 mg/200 international units p.o. t.i.d.  4. Oxycodone 5 mg q. 6 hours p.r.n. for pain.  5. Sucralfate 1 gram orally 3 times a day before meals.  6. Protonix 40 mg p.o. b.i.d.   DISCHARGE DIET: Low-sodium diet.   DISCHARGE ACTIVITY: As tolerated.    FOLLOWUP INSTRUCTIONS:  1.  CBC check scheduled on 07/03/2014 and advised to see Dr. Tawanna Solo before leaving that day and follow up with rheumatology on April 11 as prior scheduled. Discontinue methotrexate. Continue taking prednisone 30 mg p.o. daily.  2.  Advised to call MD or come to the ER if any fevers, temperature greater than 100 degrees Fahrenheit.    LABORATORIES AND IMAGING STUDIES PRIOR TO DISCHARGE:  1.  WBC 0.8, hemoglobin 8.7, hematocrit 26.7, platelet count 120,000.  2.  Sodium 137, potassium 3.7, chloride 105, bicarbonate 28, BUN 10, creatinine 0.67, glucose 84, and calcium of 8.0.  3.  Upper GI series showing small hiatal hernia, severe gastroesophageal reflex disease, and mild irregularity is noted at distal esophagus, could be secondary to reflex esophagitis versus a fixed ulcerated lesion.  4.  Vitamin B12 is on the lower end  732 pg/mL, folic acid is 20.2.  ANA test is negative. Anemia workup with normal haptoglobin and negative Coombs test and normal reticulocyte count.    CONSULTATIONS IN THE HOSPITAL:   1. GI consultation by Dr. Gustavo Lah.   2. Cardiology consultation by Dr. Saralyn Pilar.   3. Oncology consultation by Dr. Nolon Stalls.    Resulted cardiac catheterization done on 06/14/2014 showing insignificant coronary artery disease, normal LV function, no evidence of any aortic dissection. Echo Doppler showing normal LV ejection fraction, EF of 60-65%, moderate pericardial effusion, and no evidence of any cardiac tamponade. EF of 60%-65%.   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   1.  Chest pain initially thought to be from myocardial infarction. He was seen by cardiologist, had a cardiac catheterization done which showed insignificant coronary artery disease, but moderate pericardial effusion as noted on the echo. No cardiac tamponade. His chest pain seems more likely secondary to gastroesophageal reflex disease. Troponins were negative. He did not have any evidence of pericarditis.  He was started on colchicine for previous pericarditis, however because of his neutropenia that was stopped. He is already on prednisone and he will follow up with rheumatology as an outpatient.  2.  Severe gastroesophageal reflex disease. EGD could not be done due to his low white cell count. He had an upper GI series done with barium and  that showed severe reflux esophagitis and possible esophageal ulcer. He was started on sucralfate and also Protonix and that improved his symptoms and he will be discharged on the same.  3.  Significant pancytopenia, especially leukopenia secondary to Felty syndrome likely. The  patient was also on methotrexate for his rheumatoid arthritis and Felty syndrome. Seen by Dr. Mike Gip in the hospital, she was able to get in to touch with the patient's primary hematologist and also rheumatologist. Since the patient's white count has been running low recently they were in the process of stopping his methotrexate anyways and his prednisone was bumped up from 10 to 30 mg daily. Unfortunately that information was not available until discharge so the patient was getting only 10 mg here in the hospital. He is being discharged on the 30 mg which was just recently increased 1 day prior to admission. He was advised to follow up with his rheumatologist and hematologist for his white cell count and to see if further increase in prednisone can be done. So methotrexate and colchicine are being discontinued at the time of discharge. His symptoms are improved at this time. He was strongly advised to call MD if he gets any fevers or come to the ER.  4.  His course has been otherwise uneventful in the hospital.   DISCHARGE CONDITION: Stable.   DISCHARGE DISPOSITION:  Home.   TIME SPENT ON DISCHARGE: 40 minutes.     ____________________________ Gladstone Lighter, MD rk:bu D: 06/20/2014 15:05:30 ET T: 06/20/2014 19:26:32 ET JOB#: [id number]  cc: Gladstone Lighter, MD, <Dictator> Gladys Damme, MD Sterling Big, MD Gladstone Lighter MD ELECTRONICALLY SIGNED 06/22/2014 18:29

## 2014-08-06 NOTE — Consult Note (Signed)
Chief Complaint:  Subjective/Chief Complaint seen for atypical chest and epigastric pain.  unable to do egd today due to neutrapenia.  denies n/v or dysphagia.  tolerating regular diet.  had ugis this am, showing severe GERD and possible esophageal ulcer.   VITAL SIGNS/ANCILLARY NOTES: **Vital Signs.:   11-Mar-16 11:29  Vital Signs Type Routine  Temperature Temperature (F) 98.3  Temperature Source oral  Pulse Pulse 79  Respirations Respirations 18  Systolic BP Systolic BP 350  Diastolic BP (mmHg) Diastolic BP (mmHg) 74  Mean BP 93  Pulse Ox % Pulse Ox % 97  Pulse Ox Activity Level  At rest  Oxygen Delivery 2L   Brief Assessment:  Cardiac Regular   Respiratory clear BS   Gastrointestinal details normal Soft  Nontender  Nondistended  Bowel sounds normal   Lab Results: Routine Chem:  11-Mar-16 04:45   Result Comment WBC - NOTIFIED OF CRITICAL VALUE  - TSH TO CHAROLETTE KYEI $RemoveBeforeD'@0630'[id number]$  06/16/14  - READ-BACK PROCESS PERFORMED.  - RESULTS VERIFIED BY REPEAT TESTING.  - DUE TO THE LOW WBC, THE INSTRUMENT DIFF  - CANNOT BE CONFIRMED BY MANUAL DIFF AND  - IS REPORTED PRIMARILY TO IDENTIFY CELL  - TYPES PRESENT.  Result(s) reported on 16 Jun 2014 at 06:58AM.  Glucose, Serum 84 (65-99 NOTE: New Reference Range  05/30/14)  BUN 10 (6-20 NOTE: New Reference Range  05/30/14)  Creatinine (comp) 0.67 (0.61-1.24 NOTE: New Reference Range  05/30/14)  Sodium, Serum 137 (135-145 NOTE: New Reference Range  05/30/14)  Potassium, Serum 3.7 (3.5-5.1 NOTE: New Reference Range  05/30/14)  Chloride, Serum 105 (101-111 NOTE: New Reference Range  05/30/14)  CO2, Serum 28 (22-32 NOTE: New Reference Range  05/30/14)  Calcium (Total), Serum  8.0 (8.9-10.3 NOTE: New Reference Range  05/30/14)  Anion Gap  4  eGFR (African American) >60  eGFR (Non-African American) >60 (eGFR values <87mL/min/1.73 m2 may be an indication of chronic kidney disease (CKD). Calculated eGFR is useful in patients  with stable renal function. The eGFR calculation will not be reliable in acutely ill patients when serum creatinine is changing rapidly. It is not useful in patients on dialysis. The eGFR calculation may not be applicable to patients at the low and high extremes of body sizes, pregnant women, and vegetarians.)  Routine Hem:  11-Mar-16 04:45   WBC (CBC)  0.7  RBC (CBC)  2.95  Hemoglobin (CBC)  8.5  Hematocrit (CBC)  25.9  Platelet Count (CBC)  103  MCV 88  MCH 28.7  MCHC 32.6  RDW  15.5  Neutrophil % 26.0  Lymphocyte % 46.8  Monocyte % 21.2  Eosinophil % 4.9  Basophil % 1.1  Neutrophil #  0.2  Lymphocyte #  0.3  Monocyte # 0.2  Eosinophil # 0.0  Basophil # 0.0   Radiology Results: XRay:    11-Mar-16 12:31, Upper GI  Upper GI   REASON FOR EXAM:    evaluate and treat  COMMENTS:       PROCEDURE: FL  - FL UPPER GI  - Jun 16 2014 12:31PM     CLINICAL DATA:  Atypical chest pain.  Reflux .    EXAM:  UPPER GI SERIES WITHOUT KUB    TECHNIQUE:  Routine upper GI series was performed with thin barium.    FLUOROSCOPY TIME:  Scratch  Fluoroscopy Time (in minutes and seconds):  2 minutes 36 second    Number of Acquired Images:  18    COMPARISON:  CT 06/14/2014.  FINDINGS:  Small sliding hiatal hernia. Severe gastroesophageal refluxnoted.  Mild irregularity noted in the distal esophagus. This is most likely  from reflux esophagitis. No evidence of obstructing lesion. Stomach  and duodenum appear normal. C-loop is normal.     IMPRESSION:  1. Small sliding hiatal hernia.  2. Severe gastroesophageal reflux.  3. Mild irregularity noted of the distal esophagus, most likely  secondary to reflux esophagitis. Following therapy followup barium  swallow or endoscopy should be obtained to demonstrate resolution to  exclude a fixed ulcerating lesion.      Electronically Signed    By: Marcello Moores  Register    On: 06/16/2014 12:58         Verified By: Osa Craver, M.D.,  MD   Assessment/Plan:  Assessment/Plan:  Assessment 1) atypical chest pain, epigstric pain in the setting of severe GERD and possible esophageal ulcer.   2) neutrapenia.  felty syndrome.   Plan 1) continue bid ppi. 2) recommend repeat ugis in 2 weeks versus GI fu and EGD.  For egd will need to have neutrapenia resolved.  Can be addressed on o/p GI fu, but if patient is still in the hospital on monday, and neutrapenia resolved, could do egd at that time.  Discussed with Dr Vianne Bulls.  Dr Rayann Heman covering over the weekend, call if needed.   Electronic Signatures: Loistine Simas (MD)  (Signed 11-Mar-16 15:54)  Authored: Chief Complaint, VITAL SIGNS/ANCILLARY NOTES, Brief Assessment, Lab Results, Radiology Results, Assessment/Plan   Last Updated: 11-Mar-16 15:54 by Loistine Simas (MD)

## 2014-08-06 NOTE — Consult Note (Signed)
PATIENT NAME:  Marcus [name], RYKER MR#:  [id number] DATE OF BIRTH:  11/13/1960  DATE OF CONSULTATION:  06/14/2014  REFERRING PHYSICIAN:   CONSULTING PHYSICIAN:  Isaias Cowman, MD  PRIMARY CARE PHYSICIAN: [facility].   CHIEF COMPLAINT: Chest pain.   HISTORY OF PRESENT ILLNESS: This patient is a 54 year old gentleman with history of rheumatoid arthritis, Felty syndrome with pancytopenia. The patient was in his usual state of health until last week when he had a 20-minute episode of chest pain described as a stabbing, tightness, indigestion feeling in his mid chest. This lasted approximately 20 minutes and recurred last evening, rated as 10 out of 10. The patient presented to [facility] Emergency Room this morning with persistent chest pain. EKG revealed 1 to 2 mm of ST elevation in leads II, III, and aVF.   PAST MEDICAL HISTORY: 1. Rheumatoid arthritis. 2. Felty syndrome. 3. Pancytopenia.  4. Iron deficiency anemia.  5. Tobacco abuse.   MEDICATIONS: Calcium citrate/vitamin D 2 tablets b.i.d., clindamycin 1% topical b.i.d., folic acid 1 mg daily, methotrexate 20 mg once weekly, oxycodone q.4 h. p.r.n. Prednisone 30 mg for two weeks, 20 mg for two weeks, then 10 mg daily.   SOCIAL HISTORY: The patient smokes a pack of cigarettes a day.   FAMILY HISTORY: No immediate family history of coronary artery disease or myocardial infarction.   REVIEW OF SYSTEMS:    CONSTITUTIONAL: No fever or chills.  EYES: No blurry vision.  EARS: No hearing loss.  RESPIRATORY: Shortness of breath with chest pain.  CARDIOVASCULAR: The patient has chest pain as described above.  GASTROINTESTINAL: No nausea, vomiting, or diarrhea.  GENITOURINARY: No dysuria or hematuria.  ENDOCRINE: No polyuria or polydipsia.  MUSCULOSKELETAL: The patient has rheumatoid arthritis.  NEUROLOGICAL: No focal muscle weakness or numbness.  PSYCHOLOGICAL: No depression or anxiety.   PHYSICAL EXAMINATION:  VITAL SIGNS: Blood pressure  is 100/60, pulse is 120 and regular. The patient was in moderate distress.  HEENT: Pupils equal and reactive to light and accommodation.  NECK: Supple without thyromegaly.  LUNGS: Clear.  CARDIOVASCULAR: Normal JVP. Normal PMI. Regular rate and rhythm. Normal S1, S2. No appreciable gallop, murmur, or rub.  ABDOMEN: Soft and nontender. Pulses were intact bilaterally.  MUSCULOSKELETAL: Normal muscle tone.  NEUROLOGIC: The patient is alert and oriented x 3. Motor and sensory both grossly intact.   IMPRESSION: A 54 year old gentleman with a 20-minute episode of chest pain one week ago, now with prolonged episode of chest pain, EKG suggestive of inferior ST elevation myocardial infarction.   RECOMMENDATIONS: Proceed directly to cardiac catheterization for selective coronary arteriography. The patient may require primary percutaneous coronary intervention. The risks, benefits, and alternatives were explained to the patient and informed written consent was obtained.   ____________________________ Isaias Cowman, MD ap:JT D: 06/14/2014 09:39:09 ET T: 06/14/2014 13:40:21 ET JOB#: [id number]  cc: Isaias Cowman, MD, <Dictator> Isaias Cowman MD ELECTRONICALLY SIGNED 07/04/2014 12:45

## 2014-08-06 NOTE — Consult Note (Signed)
Chief Complaint:  Subjective/Chief Complaint seen for atypical chest pain, epigastric pain.  feeling some better today, no emesis.   VITAL SIGNS/ANCILLARY NOTES: **Vital Signs.:   10-Mar-16 11:00  Vital Signs Type Routine  Temperature Temperature (F) 97.8  Temperature Source oral  Pulse Pulse 81  Respirations Respirations 20  Systolic BP Systolic BP 774  Diastolic BP (mmHg) Diastolic BP (mmHg) 75  Mean BP 89  Pulse Ox % Pulse Ox % 97  Pulse Ox Activity Level  At rest  Oxygen Delivery Room Air/ 21 %   Brief Assessment:  Cardiac Regular   Respiratory clear BS   Gastrointestinal details normal Soft  Nontender  Nondistended  No masses palpable  Bowel sounds normal   Lab Results: Cardiology:  09-Mar-16 17:39   Echo Doppler REASON FOR EXAM:     COMMENTS:     PROCEDURE: [facility] - ECHO DOPPLER COMPLETE(TRANSTHOR)  - Jun 14 2014  5:39PM   RESULT: Echocardiogram Report  Patient Name:   Marcus [name] [facility] Date of Exam: 06/14/2014 Medical Rec #:  [id number]       Custom1: Date of Birth:  03/16/1961     Height:       70.0 in Patient Age:    54 years     Weight:       201.0 lb Patient Gender: M            BSA:          2.09 m??  Indications: Pericardial Effusion Sonographer:    Janalee Dane RCS Referring Phys: Fritzi Mandes, A  Summary:  1. Left ventricular ejection fraction, by visual estimation, is 60 to  65%.  2. Normal global left ventricular systolic function.  3. Small right atrium.  4. Moderate pericardial effusion, as described above.  5. There is inversionof the right ventricular wall and inversion of the  right atrial wall.  6. Mildly increased left ventricular posterior wall thickness. 2D AND M-MODE MEASUREMENTS (normal ranges within parentheses): Left Ventricle:          Normal IVSd (2D):1.23 cm (0.7-1.1) LVPWd (2D):     1.18 cm (0.7-1.1) Aorta/LA:                  Normal LVIDd (2D):     3.62 cm (3.4-5.7) Aortic Root (2D): 3.30 cm (2.4-3.7) LVIDs (2D):     2.08 cm            Left Atrium (2D): 3.50 cm (1.9-4.0) LV FS (2D):     42.5%   (>25%) LV EF (2D):     74.5 %   (>50%)                                   Right Ventricle:                                   RVd (2D):        3.68 cm SPECTRAL DOPPLER ANALYSIS (where applicable):  PHYSICIAN INTERPRETATION: Left Ventricle: The left ventricular internal cavity size was normal. LV  posterior wall thickness was mildly increased. Global LV systolic  function was normal. Left ventricular ejection fraction, by visual  estimation, is 60 to 65%. Right Ventricle: Normal rightventricular size, wall thickness, and  systolic function. RV wall thickness is normal. Left Atrium: The left atrium is normal in size and  structure. Right Atrium: The right atrium is small. Pericardium: A moderately sized pericardial effusion is present. There is  inversion of the right ventricular wall and inversion of the right atrial  wall. There is no evidence of cardiac tamponade. Mitral Valve: Structurally normal mitral valve, with normal leaflet  excursion; without any evidence of mitral stenosis or significant  regurgitation. Tricuspid Valve: Structurally normal tricuspid valve, with normal leaflet  excursion. The tricuspid valve is normal. Trivial tricuspid regurgitation  is visualized. Aortic Valve: The aortic valve is trileaflet and structurally normal,  with normal leaflet excursion; without any evidence of aortic stenosis or  insufficiency. Aorta: The aortic root and ascending aorta are structurally normal, with   no evidence of dilitation. Shunts: There isno evidence of a patent foramen ovale. There is no  evidence of an atrial septal defect.  [place] MD Electronically signed by [id number] Serafina Royals MD Signature Date/Time: 06/15/2014/7:55:09 AM  *** Final ***  IMPRESSION: .    Verified By: Corey Skains  ([facility]), M.D., MD  Routine Chem:  10-Mar-16 03:55   Glucose, Serum  107 (65-99 NOTE: New Reference  Range  05/30/14)  BUN 19 (6-20 NOTE: New Reference Range  05/30/14)  Creatinine (comp) 0.87 (0.61-1.24 NOTE: New Reference Range  05/30/14)  Sodium, Serum  134 (135-145 NOTE: New Reference Range  05/30/14)  Potassium, Serum 4.4 (3.5-5.1 NOTE: New Reference Range  05/30/14)  Chloride, Serum 102 (101-111 NOTE: New Reference Range  05/30/14)  CO2, Serum 24 (22-32 NOTE: New Reference Range  05/30/14)  Calcium (Total), Serum  7.6 (8.9-10.3 NOTE: New Reference Range  05/30/14)  Anion Gap 8  eGFR (African American) >60  eGFR (Non-African American) >60 (eGFR values <80mL/min/1.73 m2 may be an indication of chronic kidney disease (CKD). Calculated eGFR is useful in patients with stable renal function. The eGFR calculation will not be reliable in acutely ill patients when serum creatinine is changing rapidly. It is not useful in patients on dialysis. The eGFR calculation may not be applicable to patients at the low and high extremes of body sizes, pregnant women, and vegetarians.)   Radiology Results: [place]:    09-Mar-16 12:14, US Abdomen General Survey  US Abdomen General Survey   REASON FOR EXAM:    abd pain  COMMENTS:       PROCEDURE: [place]  - US ABDOMEN GENERAL SURVEY  - Jun 14 2014 12:14PM     CLINICAL DATA:  Generalized abdominal pain worse over the epigastric  region 1 year. Nausea and vomiting intermittently.    EXAM:  ULTRASOUND ABDOMEN COMPLETE    COMPARISON:  CT today.    FINDINGS:  Gallbladder: Thickened mildly edematous wall measuring 4.7 x 7.5 mm.  No significant cholelithiasis or sludge. Negative sonographic  Murphy's sign.    Common bile duct: Diameter: 3.5 mm.    Liver: No focal lesion identified. Within normal limits in  parenchymal echogenicity. Normal directional flow within the main  portal vein and hepatic veins.    IVC: Somewhat slow flow is noted which may be due to restrictive  effect of patient's moderate pericardial effusion.    Pancreas:  Visualized portion unremarkable.    Spleen: Moderate splenomegaly measuring 18 cm in greatest diameter  (volume 1147 cm3).    Right Kidney: Length: 11.5 cm. Echogenicity within normal limits. No  mass or hydronephrosis visualized. Sub cm cyst over the mid to upper  pole.    Left Kidney: Length: 11.0 cm. Echogenicity within normal limits. No  mass or hydronephrosis visualized.    Abdominal aorta: No aneurysm visualized.    Other findings: Small left pleural effusion.     IMPRESSION:  Nonspecific gallbladder wall thickening measuring 4.7-7.5 mm. No  cholelithiasis or sludge and negative sonographic Murphy's sign.  Although this finding can be seen with acalculous cholecystitis, is  more likely related to patient's congestive cardiac state as  evidenced by moderate size pericardial effusion, left pleural  effusion and slow IVC flow.    Moderate splenomegaly.      Electronically Signed    By: Marin Olp M.D.    On: 06/14/2014 12:33         Verified By: Pearletha Alfred, M.D.,  CT:    09-Mar-16 12:25, CT Abdomen With Contrast  CT Abdomen With Contrast   REASON FOR EXAM:    abd pain, chest pain and diaphoresis. h/o   splenomegaly;    NOTE: Nursing to Give  COMMENTS:       PROCEDURE: CT  - CT ABDOMEN STANDARD W  - Jun 14 2014 12:25PM     CLINICAL DATA:  Abdominal pain. Chest pain and diaphoresis. History  of splenomegaly.    EXAM:  CT ABDOMEN WITH CONTRAST    TECHNIQUE:  Multidetector CT imaging of the abdomen was performed using the  standard protocol following bolus administration of intravenous  contrast.    CONTRAST:  50 cc Omnipaque 300    COMPARISON:  06/14/2014 abdominal ultrasound.  No prior CT.    FINDINGS:  Lower chest: Left base volume loss. Small left-sided pleural  effusion. Minimal loculated component versus pleural thickening  anteriorly. Minimal loculated right-sided pleural fluid medially.  Normal heart size. Moderate pericardial effusion with  pericardial  thickening, mild.    Hepatobiliary: Normal liver. No calcified gallstones.  Pericholecystic edema is identified, including on images 30-36 of  series 2. No gallbladder wall thickening. No biliary ductal  dilatation.    Pancreas: Normal, without mass or ductal dilatation.    Spleen: Moderate splenomegaly, 19.7 cm craniocaudal. No focal  splenic abnormality.    Adrenals/Urinary Tract: Normal adrenal glands. Too small to  characterize lower pole right renal lesion. Normal left kidney,  without hydronephrosis.    Stomach/Bowel: Normal stomach, without wall thickening. Normal  abdominal portions of the colon. Although the right upper quadrant  edema is identified adjacent to the descending duodenum, No duodenal  wall thickening. Small bowel loops are otherwise unremarkable.    Vascular/Lymphatic: Aortic and branch vessel atherosclerosis. Non  aneurysmal dilatation of the infrarenal abdominal aorta at maximally  2.4 cm. No retroperitoneal or retrocrural adenopathy.    Other: No ascites.  Fat containing ventral abdominal wall hernia.    Musculoskeletal: Disc bulge at the lumbosacral junction.     IMPRESSION:  1. Right upper quadrant edema, centered about the gallbladder. Given  the appearance on ultrasound, acalculus cholecystitis remains a  concern. Although there is a moderate pericardial effusion, other  findings of elevated right heart pressure are not identified.  2. Moderate to marked splenomegaly.  3.Small left pleural effusion with anterior loculation or  thickening. Tiny right sided loculated pleural fluid.  4. Moderate pericardial effusion with pericardial thickening as can  be seen with chronicity or complexity.      Electronically Signed    By: Abigail Miyamoto M.D.    On: 06/14/2014 12:45         Verified By: Areta Haber, M.D.,   Assessment/Plan:  Assessment/Plan:  Assessment 1) atypical chest  and epigastric pain.  feeling some better today.    Plan 1) EGD for tomorrow, appreciate cardiology assessment and clearance.  I have discussed the risks benefits and complications of proceedure to include not limitd to bleeding infection perforation and sedation and he wisnhes to proceed.   Electronic Signatures: Loistine Simas (MD)  (Signed 10-Mar-16 18:46)  Authored: Chief Complaint, VITAL SIGNS/ANCILLARY NOTES, Brief Assessment, Lab Results, Radiology Results, Assessment/Plan   Last Updated: 10-Mar-16 18:46 by Loistine Simas (MD)

## 2014-08-06 NOTE — Consult Note (Signed)
Brief Consult Note: Diagnosis: chest pain.   Patient was seen by consultant.   Consult note dictated.   Comments: Appreciate consult for 54 y/o caucasian man with history of RA with methotrexate/prednisone therapy, Felty syndrome, IDA, tobacco abuse, for evaluation of epigastric pain. Did undergo cardiac cath today w Dr Saralyn Pilar for chest pain/SOB/st elevation in inferior leads. Findings included insignificant CAD and a normal LVF. Troponin was normal, but last was elevated. There is an echo pending. Ct abdomen demonstrated a pericardial effusion, IVC congestion, and some fluid around the gallbladdder. This was done in follow up for [place] that demonstrated some gallbladder wall thickening and percholecytic fluid. there were no gallstones or ductal dilation. Acalculos cholecystitis was suggested as a possibility. Patient reports epigastric pain, dyspepsia, and odynophagia over the last year. Ep pain travels up chest to neck. Increased last night, unsure why. Takes vinegar for his dyspepsia, not any tums, ppi, or h2ra. Reports some mild weight loss. takes Aleve rarely, no other NSAIDs. Drinks 6 mountain dews/d. States food relieves his epigastric pain usually. There has been no NVD, melena/ hematochezia, or other GI concerns.  States he had an EGD last year with a spot in his throat but does not know what it was- this was done at [facility]. No hx colonoscopy. Has hx felty syndrome with includes splenomegaly, RA, granulocytopenia- follows with [facility] rheumatology. Family hx significant for colon polyps in brother, colon cancer in grandparent, and someone had ulcers. Abd soft nd on exam, some epigastric and ruq tenderness.  States pain better after meds and sob resolved. Impression and plan: Epigastric pain and dyspepsia. Discussed vinegar and carbonation effects on GI tract. Agree with ppi- will make IV for now, recommed ppi therapy as o/p, no vinegar/nsaids. Should have EGD for luminal eval as clinically feasible- may  need to complete card eval due to rising troponins- will discuss further with Dr Gustavo Lah. consider gb eval as clinically indicated..  Electronic Signatures: Stephens November H (NP)  (Signed 09-Mar-16 16:40)  Authored: Brief Consult Note   Last Updated: 09-Mar-16 16:40 by Theodore Demark (NP)

## 2014-08-06 NOTE — Consult Note (Signed)
PATIENT NAME:  Marcus [name], Marcus [name] MR#:  [id number] DATE OF BIRTH:  1961/03/18  DATE OF CONSULTATION:  06/17/2014  REFERRING PHYSICIAN:  Dr.  Larry Sierras  CONSULTING PHYSICIAN:  Melissa C. 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    CHIEF COMPLAINT: The patient is a 54 year old gentleman with Felty's syndrome, who was admitted on 06/14/2014 with chest pain and upper abdominal pain.   HISTORY OF PRESENT ILLNESS:  The patient states that he was diagnosed with low blood counts (pancytopenia) when he was admitted to [facility] on 07/02/2013.  He presented with fevers, sweats, and a 10 pound weight loss.  He states his white blood count was 0.3. He also noted "boils under my right arm."  He underwent a bone marrow aspirate and biopsy.  He is unaware of the results.  He also underwent a right cervical node biopsy.  His counts were monitored throughout his admission. He also underwent upper endoscopy where there was notation of a "black spot on the voice box."  He did not require transfusion.   Following discharge from the hospital, he placed on Levaquin.  Levaquin was discontinued secondary to a tendonitis.  He states that he has had no further problems with infections. He has never had positive blood cultures.   The patient has been subsequently followed by Dr. Sterling Big of hematology oncology at [facility]. He states that he was last seen last year. His counts have remained low.   The patient notes that he was diagnosed with rheumatoid arthritis following his discharge from the hospital. He has been followed by Dr. Daryll Brod of rheumatology at [facility].  He is followed regularly. He states that he was initially followed every 2 to 3 months, but more recently, 2 to 3 times a month secondary to low blood counts.    The patient states that he has been treated with  methotrexate for the past 8 to 9 months. He started at 4 pills once a week, then 6 pills a week and then 8 pills a week on Tuesdays.  He has been on 8 pills a week for the past 1-1/2 to 2 months. He comments that for the last 2 times when he took his methotrexate on Tuesday, he felt like he was having a heart attack. He describes knifelike stabbing pains greater than a level of 10.  He notes that his "breathing was hard."  He denied any nausea or vomiting. He noted a little bit of tingling in his left arm and a sensation of someone sitting on his chest.   Prior to admission, the patient denies any fever.  He did note some chills last week with some sweats. Apparently he had a syncopal episode. He notes diarrhea and cramping.  He denies any melena or hematochezia.   The patient presented with the above symptomatology to the Emergency Room.  He underwent cardiac catheterization on 06/14/2014.  He had no coronary artery disease.  Echocardiogram on 06/14/2014 revealed an ejection fraction of 60% to 65% with a moderate pericardial effusion.     On admission, the patient underwent abdominal CT scan.  Scan revealed pericholecystic edema.  There was a moderate pericardial effusion and findings of elevated right heart pressure. There was moderate to marked splenomegaly (19.7 cm).  There was a small left-sided pleural effusion with anterior loculation or thickening and a tiny right-sided loculated pleural effusion.   The  patient has been seen by Dr. Gustavo Lah of gastroenterology.  Abdominal ultrasound on 06/14/2014 revealed nonspecific gallbladder wall thickening (4.7 to 7.5 mm). There was no cholelithiasis or sludge. There was a negative sonographic Murphy sign.   The patient underwent upper GI on 06/16/2014.  He has severe reflux and mild irregular distal esophagus, possibly suggesting an ulcer.   The patient has had labs followed during this admission.  Initial CBC on 06/14/2014 revealed a hematocrit of 38.8,  hemoglobin 12.4, platelets 151,000, white count 2100 with an Merriman of 1000. Differential was unremarkable. The patient comments that his white blood cell count was "good".  Comprehensive metabolic panel included a BUN of 23, creatinine of 1.33. Liver function tests were normal.  PT was 14.9 with INR 1.2, PTT was 33.6.   Follow-up labs have revealed a decrease in hematocrit, as well as platelet count. Labs on 06/16/2014 include hematocrit 26.7, hemoglobin 9, platelets 119,000, white count 600, with an ANC of 100.   The patient notes that recently he has been seen by Dr. Daryll Brod more frequently (2 to 3 times a month) secondary to his blood counts dropping.  He did receive his methotrexate on 06/13/2014.  He denies any fevers. He denies any new medications or herbal products. He does not drink quinine water.    PAST MEDICAL HISTORY.  1.  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rheumatoid arthritis, splenomegaly, and leukopenia).  2.  Iron deficiency anemia.  3.  Hiatal hernia.  4.  Reflux. 5.  Raynaud's syndrome.   PAST SURGICAL HISTORY:  Bone marrow aspirate and biopsy 06/2013.   FAMILY HISTORY: The patient's mother is alive. She has decreased hearing and vision. His father died of a CVA.  He has 4 brothers and 1 sister. Illnesses include COPD, coronary artery disease and diabetes. The patient has no children. A maternal grandfather had colon cancer.   SOCIAL HISTORY: The patient lives in [place] with his mother. He does not work. He previously did landscape work. He denies any exposure to radiations or toxins. He has not had any alcohol for the past 30 years.  He has been smoking 1 pack a day for 40 years.   MEDICATIONS:  Folic acid, nicotine patch, prednisone 10 mg a day, aspirin 81 mg a day, Carafate, pantoprazole, insulin, Tylenol, morphine p.r.n., oxycodone p.r.n.   ALLERGIES:  LEVAQUIN, WHICH CAUSES TENDINITIS.    REVIEW OF SYSTEMS:   CONSTITUTIONAL:  No recent fever, chills or sweats. Weight loss of 10  pounds from last year has been gained back. Chills last week.  EYES:  No change in vision, no diplopia, ENMT:  Intermittent mouth sores. Runny nose.  CARDIOVASCULAR:  Chest pain as noted in history of present illness, now resolved. RESPIRATORY:  Shortness of breath, associated with chest pain. Clear phlegm. No hemoptysis.  GASTROINTESTINAL:  Notes epigastric pain, now improved. No further cramping. Diarrhea twice a day. No melena or hematochezia.  GENITOURINARY:  No dysuria, hematuria.  ENDOCRINE:  No diabetes or thyroid disease.  Skin no rashes or ulcers.  PSYCHIATRIC:  No mood changes.  HEMATOLOGIC/LYMPHATIC:  No increased bruising or bleeding. No tender or swollen lymph nodes. NEUROLOGIC:  No focal weakness or numbness.    PHYSICAL EXAM:  GENERAL: The patient is sitting comfortably on the medical unit in no acute distress.  VITAL SIGNS:  Height 70 inches, weight 200.1 pounds. Temperature 98.3, pulse 84, respiratory rate 24, blood pressure 121/76, oxygen saturation 97% on room air. HEAD:  Dark hair with a graying crewcut. Face symmetric.  No cushingoid features.  EYES:  Blue-gray. Pupils equal, round, reactive to light and accommodation. No conjunctivitis or scleral icterus.  ENMT:  Upper dentures. No oral lesions.  NECK:  Supple without adenopathy.  CARDIOVASCULAR:  Regular rate and rhythm without murmur, rub or gallop.  LUNGS:  Intermittent cough.  Faint, dry crackles at the base. No wheezes or rhonchi.  ABDOMEN:  Fully round, soft, nontender with active bowel sounds. Spleen palpable, 2 fingerbreadths above the umbilicus. No guarding or rebound tenderness.  LYMPH NODES:  No palpable cervical, supraclavicular, axillary, or inguinal adenopathy.  EXTREMITIES:  No edema.  NEUROLOGIC:  Appropriate.   DATA REVIEWED:  Notes from his chart imaging studies and labs were reviewed.  CBC today includes hematocrit 25.3, hemoglobin 8.1, platelets 114,000, white count 600.  Differential includes 18%  segs, 62% lymphs, 14% monocytes, 6% eosinophils.  ANC is 108.  Folate is 12.6. TSH is elevated at 5.814.  Coombs is negative.  Reticulocyte count is 0.0881 (low for level of anemia).   ASSESSMENT:  The patient is a 54 year old gentleman with 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rheumatoid arthritis, splenomegaly and neutropenia) who was admitted with chest pain and abdominal pain. He has had a cardiac cath with no coronary artery disease.  Echocardiogram revealed moderate pericardial effusion.  Imaging studies are notable for a moderate pericardial effusion and small left pleural effusion. He has pericholecystic edema. Upper gastrointestinal series reveals severe reflux, and some mild irregularity at the distal esophagus.  He has diarrhea.  The patient has a marked splenomegaly (19.7 cm) secondary to his Felty's syndrome. He has had ongoing issues with severe neutropenia and low blood counts which by his history have been more problematic in the past month.  He has been on methotrexate for treatment.  Methotrexate can suppress his marrow, especially in the setting of any third space fluid.  He has had no fever. Colchicine, which can be myelosuppressive, has been discontinued.   PLAN:  1.  Laboratory ordered to include B12, folate, TSH, reticulocyte count, Coombs.   2.  Additional laboratories to include parvovirus B-19 and T4 given elevated TSH.  3.  Neutropenic precautions.  4.  Type and screen for possible need for transfusion if hematocrit drifts further down given the patient's low reticulocyte count. If he would require transfusion, would give leukopoor and irradiated packed red blood cells.  5.  If the patient develops a fever (100.4 or higher or chills), would institute empiric antibiotics (cefepime), as well as a fever work-up (blood and urine cultures, as well as a follow-up chest x-ray).  6.  Discontinue methotrexate - done.   7.  I will follow up with Dr. Daryll Brod and Dr. Gaylyn Cheers on Monday 06/19/2014 regarding the  current events.  8.  Consider use of G-CSF if patient develops a significant infection or concern for sepsis.  9.  Guaiac all stools. 10.  Discuss smoking cessation with patient.      Thank you for allowing me to participate in Mr. Mccathern care.  I will follow him closely with you while hospitalized.      ____________________________ Drue Second Mike Gip, MD mcc:DT D: 06/17/2014 10:37:30 ET T: 06/17/2014 11:07:11 ET JOB#: [id number]  cc: Melissa C. Mike Gip, MD, <Dictator> Dr. Larry Sierras Dr. Daryll Brod at [place], Sterling Big at [place] SIGNED 06/17/2014 16:03

## 2014-08-06 NOTE — Consult Note (Signed)
PATIENT NAME:  Marcus [name], Marcus [name] MR#:  [id number] DATE OF BIRTH:  [birth year]  DATE OF CONSULTATION:  06/14/2014  REFERRING PHYSICIAN:   CONSULTING PHYSICIAN:  Theodore Demark, NP  REASON FOR CONSULTATION: GI consult ordered by Dr. Posey Pronto for evaluation of epigastric pain.   HISTORY OF PRESENT ILLNESS:  Appreciate consult for a 54 year old Caucasian man with history of RA with methotrexate/prednisone therapy, Felty syndrome, IDA, tobacco abuse, for evaluation of epigastric pain. Did undergo cardiac catheterization today with Dr. Saralyn Pilar for chest pain/shortness of breath/ST elevation in inferior leads. Findings included insignificant CAD and normal LVEF. Troponin was normal, but the last was elevated. There is an echo pending. CT abdomen demonstrated pericardial effusion, IVC congestion, and some fluid around the gallbladder. This was done in followup for ultrasound that demonstrated some gallbladder wall thickening and pericholecystic fluid, but there were no gallstones or ductal dilation, acalculous cholecystitis was suggested as a possibility. The patient reports epigastric pain, dyspepsia, no odynophagia over the last year. Epigastric pain travels up the chest to the neck, increased last night, unsure why. Takes vinegar for his dyspepsia. Not on any Tums, PPI, or H2RA. Report some mild weight loss. Takes Aleve rarely, no other nonsteroidal anti-inflammatory drugs. Drinks 6 [employer] a day. States food generally relieves his epigastric pain. There has been no nausea, vomiting, diarrhea, hematochezia, nor any other GI complaint. States he had an EGD last year with a spot in his throat, but does not know what it was, this was done at [facility]. No history of colonoscopy. Has history of Felty syndrome which includes splenomegaly, RA, granulocytopenia, follows with [facility] rheumatology.   FAMILY HISTORY: Significant for colon polyps in brother, colon cancer in a grandparent, and someone had ulcers.   PAST  MEDICAL HISTORY: RA, Felty syndrome, iron deficiency anemia, tobacco abuse.   MEDICATIONS: Calcium with D b.i.d., clindamycin 1% topical cream b.i.d. p.r.n., folic acid 1 mg once a day, methotrexate 20 mg once weekly, oxycodone 5 mg q. 4 h. p.r.n., he is currently on a prednisone taper.   SOCIAL HISTORY: Smokes a pack of cigarettes a day. No EtOH or illicits.   FAMILY HISTORY: Maternal grandfather with colon cancer, brother with colon polyps, there are ulcers in the family as well as diabetes and an unknown type of cancer.   REVIEW OF SYSTEMS:  Ten systems reviewed, unremarkable other than what is noted above.   ALLERGIES: LEVAQUIN.   LABORATORY DATA: Most recent laboratories, glucose 206, BUN 23, creatinine 1.33, sodium 135, potassium 4.5, chloride 102, GFR greater than 60. Calcium 8. A1c 4.4. Total protein 7.4, albumin 3.1, total bilirubin 1.2, direct bilirubin 0.5, ALP 59, AST 25, ALT 20. CPK MB 0.7, 1.2, 1.3. Troponin I 0.03, 0.06, 0.07. WBC 2.1, hemoglobin 12.4, hematocrit 38.8, platelet count 151,000, red cells normocytic, slightly hypochromic. PT 14.9, INR 1.2. Cardiac catheterization report today with insignificant CAD, normal LVEF, no evidence for aortic dissection. CT with results as above. Ultrasound with a thickened gallbladder wall, no significant choledocholithiasis or sludge, negative sonographic Murphy sign, common bile duct is normal, moderate splenomegaly, possible small kidney cyst, otherwise unremarkable. Had a chest x-ray with small pleural effusions.    PHYSICAL EXAMINATION:  VITAL SIGNS: Most recent vital signs, temperature 98.2, pulse 91, respiratory rate 20, blood pressure 107/72, SaO2 97% on room air.  GENERAL: Well-appearing, middle-aged man lying in bed, in no acute distress.  HEENT: Normocephalic, atraumatic. Conjunctivae pink. Sclerae clear.  NECK: Supple. No JVD or thyromegaly.  CHEST: Respiration is  eupneic. Lungs clear.  CARDIAC: S1, S2, RRR. No MRG. No edema.   ABDOMEN: Flat. Bowel sounds x 4. Some right upper quadrant and epigastric tenderness. No guarding, rigidity, rebound tenderness, peritoneal signs, hepatomegaly. I am unable to appreciate splenomegaly at this point. No masses.  SKIN: Warm, dry, pink. No erythema, lesion, or rash.  EXTREMITIES: MAEW x 4. No clubbing or cyanosis.  NEUROLOGIC: Alert, oriented x 3. Cranial nerves II through XII intact. Speech clear. No facial droop.  PSYCHIATRIC: Pleasant, calm, cooperative.   IMPRESSION AND PLAN: Epigastric pain, dyspepsia. Discussed vinegar and carbonation effects on GI tract. Agree with PPI, will it IV b.i.d. for now. Recommend PPI therapy as outpatient. No vinegar or nonsteroidal anti-inflammatory drugs. Have discussed with Dr. Gustavo Lah as his troponins have come back elevated and there is concern for some pericardial effusion and he has Felty syndrome. Would like him to complete his cardiac evaluation first. We will consider a HIDA to assess for acalculous cholecystitis and then consider EGD for luminal evaluation if clinically feasible.   Thank you very much for this consult.   These services were provided by Stephens November, MSN, [facility], in collaboration with Lollie Sails, MD, with whom I discussed this patient in full    ____________________________ Theodore Demark, NP chl:bu D: 06/14/2014 18:07:23 ET T: 06/14/2014 18:30:36 ET JOB#: [id number]  cc: Theodore Demark, NP, <Dictator> [place] SIGNED 06/22/2014 8:36

## 2015-07-23 ENCOUNTER — Encounter: Payer: Self-pay | Admitting: Emergency Medicine

## 2015-07-23 ENCOUNTER — Emergency Department: Payer: Self-pay

## 2015-07-23 ENCOUNTER — Inpatient Hospital Stay
Admission: EM | Admit: 2015-07-23 | Discharge: 2015-07-28 | DRG: 193 | Disposition: A | Discharge status: Home / Self Care | Payer: Self-pay | Attending: Specialist | Admitting: Specialist

## 2015-07-23 DIAGNOSIS — G8929 Other chronic pain: Secondary | ICD-10-CM

## 2015-07-23 DIAGNOSIS — R162 Hepatomegaly with splenomegaly, not elsewhere classified: Secondary | ICD-10-CM | POA: Diagnosis present

## 2015-07-23 DIAGNOSIS — J209 Acute bronchitis, unspecified: Secondary | ICD-10-CM | POA: Diagnosis present

## 2015-07-23 DIAGNOSIS — K769 Liver disease, unspecified: Secondary | ICD-10-CM

## 2015-07-23 DIAGNOSIS — E43 Unspecified severe protein-calorie malnutrition: Secondary | ICD-10-CM | POA: Diagnosis present

## 2015-07-23 DIAGNOSIS — F17210 Nicotine dependence, cigarettes, uncomplicated: Secondary | ICD-10-CM | POA: Diagnosis present

## 2015-07-23 DIAGNOSIS — M059 Rheumatoid arthritis with rheumatoid factor, unspecified: Secondary | ICD-10-CM | POA: Diagnosis present

## 2015-07-23 DIAGNOSIS — K219 Gastro-esophageal reflux disease without esophagitis: Secondary | ICD-10-CM | POA: Diagnosis present

## 2015-07-23 DIAGNOSIS — E440 Moderate protein-calorie malnutrition: Secondary | ICD-10-CM | POA: Insufficient documentation

## 2015-07-23 DIAGNOSIS — J040 Acute laryngitis: Secondary | ICD-10-CM | POA: Diagnosis present

## 2015-07-23 DIAGNOSIS — R490 Dysphonia: Secondary | ICD-10-CM | POA: Diagnosis present

## 2015-07-23 DIAGNOSIS — M0500 Felty's syndrome, unspecified site: Secondary | ICD-10-CM | POA: Diagnosis present

## 2015-07-23 DIAGNOSIS — D509 Iron deficiency anemia, unspecified: Secondary | ICD-10-CM | POA: Diagnosis present

## 2015-07-23 DIAGNOSIS — E871 Hypo-osmolality and hyponatremia: Secondary | ICD-10-CM | POA: Diagnosis present

## 2015-07-23 DIAGNOSIS — D61818 Other pancytopenia: Secondary | ICD-10-CM | POA: Diagnosis present

## 2015-07-23 DIAGNOSIS — Z6823 Body mass index (BMI) 23.0-23.9, adult: Secondary | ICD-10-CM

## 2015-07-23 DIAGNOSIS — Z881 Allergy status to other antibiotic agents status: Secondary | ICD-10-CM

## 2015-07-23 DIAGNOSIS — I959 Hypotension, unspecified: Secondary | ICD-10-CM | POA: Diagnosis present

## 2015-07-23 DIAGNOSIS — M79642 Pain in left hand: Secondary | ICD-10-CM

## 2015-07-23 DIAGNOSIS — R188 Other ascites: Secondary | ICD-10-CM

## 2015-07-23 DIAGNOSIS — R739 Hyperglycemia, unspecified: Secondary | ICD-10-CM | POA: Diagnosis present

## 2015-07-23 DIAGNOSIS — M79641 Pain in right hand: Secondary | ICD-10-CM

## 2015-07-23 DIAGNOSIS — J189 Pneumonia, unspecified organism: Principal | ICD-10-CM | POA: Diagnosis present

## 2015-07-23 DIAGNOSIS — R161 Splenomegaly, not elsewhere classified: Secondary | ICD-10-CM | POA: Diagnosis present

## 2015-07-23 HISTORY — DX: Nicotine dependence, unspecified, uncomplicated: F17.200

## 2015-07-23 HISTORY — DX: Felty's syndrome, unspecified site: M05.00

## 2015-07-23 HISTORY — DX: Other pancytopenia: D61.818

## 2015-07-23 HISTORY — DX: Rheumatoid arthritis with rheumatoid factor, unspecified: M05.9

## 2015-07-23 LAB — TSH: TSH: 1.91 u[IU]/mL (ref 0.350–4.500)

## 2015-07-23 LAB — CBC
HEMATOCRIT: 21.4 % — AB (ref 40.0–52.0)
HEMOGLOBIN: 6.9 g/dL — AB (ref 13.0–18.0)
MCH: 21.3 pg — AB (ref 26.0–34.0)
MCHC: 32.2 g/dL (ref 32.0–36.0)
MCV: 66.3 fL — AB (ref 80.0–100.0)
Platelets: 85 10*3/uL — ABNORMAL LOW (ref 150–440)
RBC: 3.23 MIL/uL — AB (ref 4.40–5.90)
RDW: 22 % — ABNORMAL HIGH (ref 11.5–14.5)
WBC: 0.4 10*3/uL — CL (ref 3.8–10.6)

## 2015-07-23 LAB — PREPARE RBC (CROSSMATCH)

## 2015-07-23 LAB — BASIC METABOLIC PANEL
Anion gap: 8 (ref 5–15)
BUN: 10 mg/dL (ref 6–20)
CHLORIDE: 100 mmol/L — AB (ref 101–111)
CO2: 22 mmol/L (ref 22–32)
CREATININE: 0.81 mg/dL (ref 0.61–1.24)
Calcium: 7.9 mg/dL — ABNORMAL LOW (ref 8.9–10.3)
GFR calc non Af Amer: >60 mL/min (ref >60)
Glucose, Bld: 137 mg/dL — ABNORMAL HIGH (ref 65–99)
POTASSIUM: 3.9 mmol/L (ref 3.5–5.1)
Sodium: 130 mmol/L — ABNORMAL LOW (ref 135–145)

## 2015-07-23 LAB — ABO/RH: ABO/RH(D): A POS

## 2015-07-23 LAB — TROPONIN I: Troponin I: <0.03 ng/mL (ref <0.031)

## 2015-07-23 IMAGING — CR DG CHEST 2V
2 series · 2 of 2 positions shown · non-contrast
Comparison: [DATE] and [DATE]

CLINICAL DATA: Chest pain and shortness of breath. Productive
cough.

EXAM:
CHEST  2 VIEW

[chest pa]
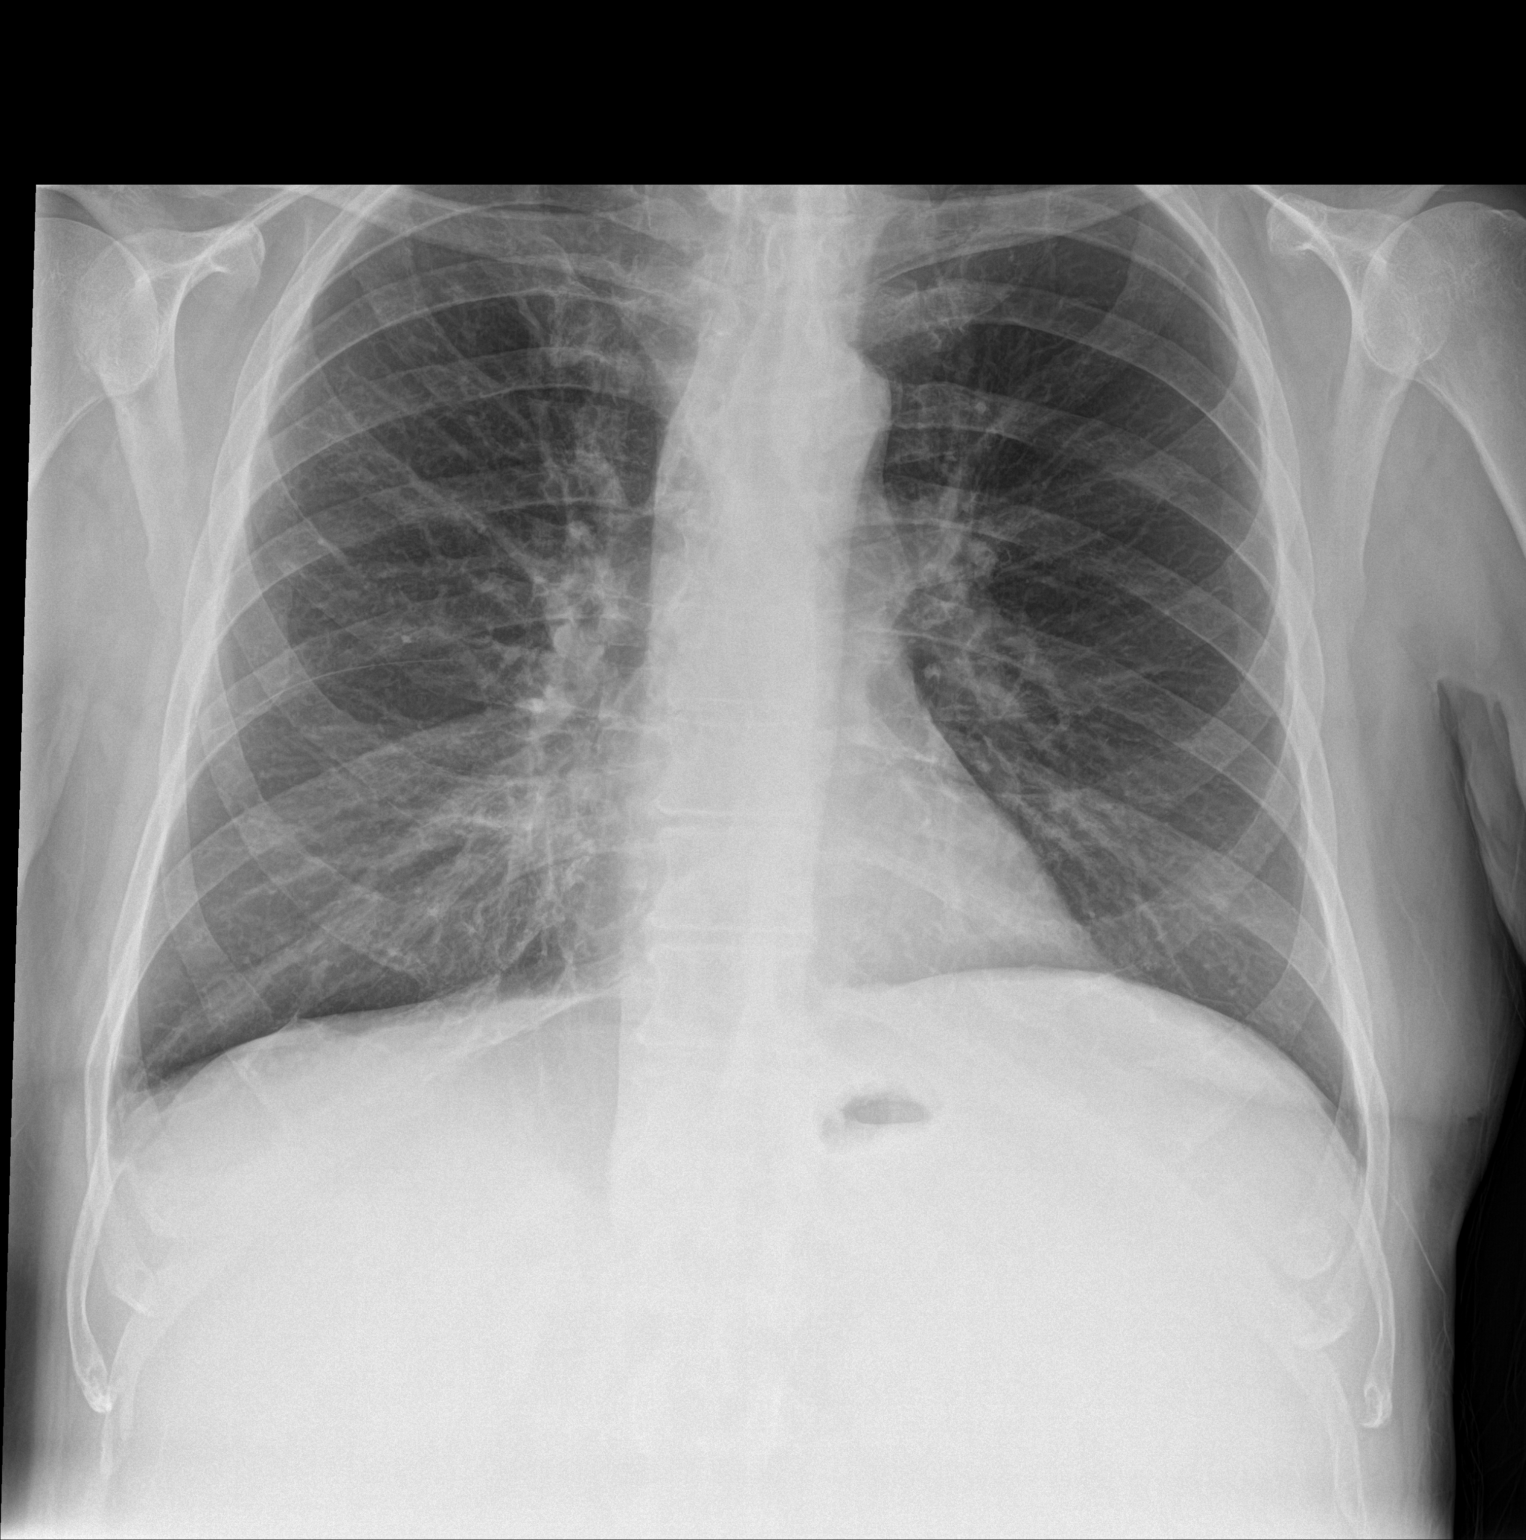

[chest lat]
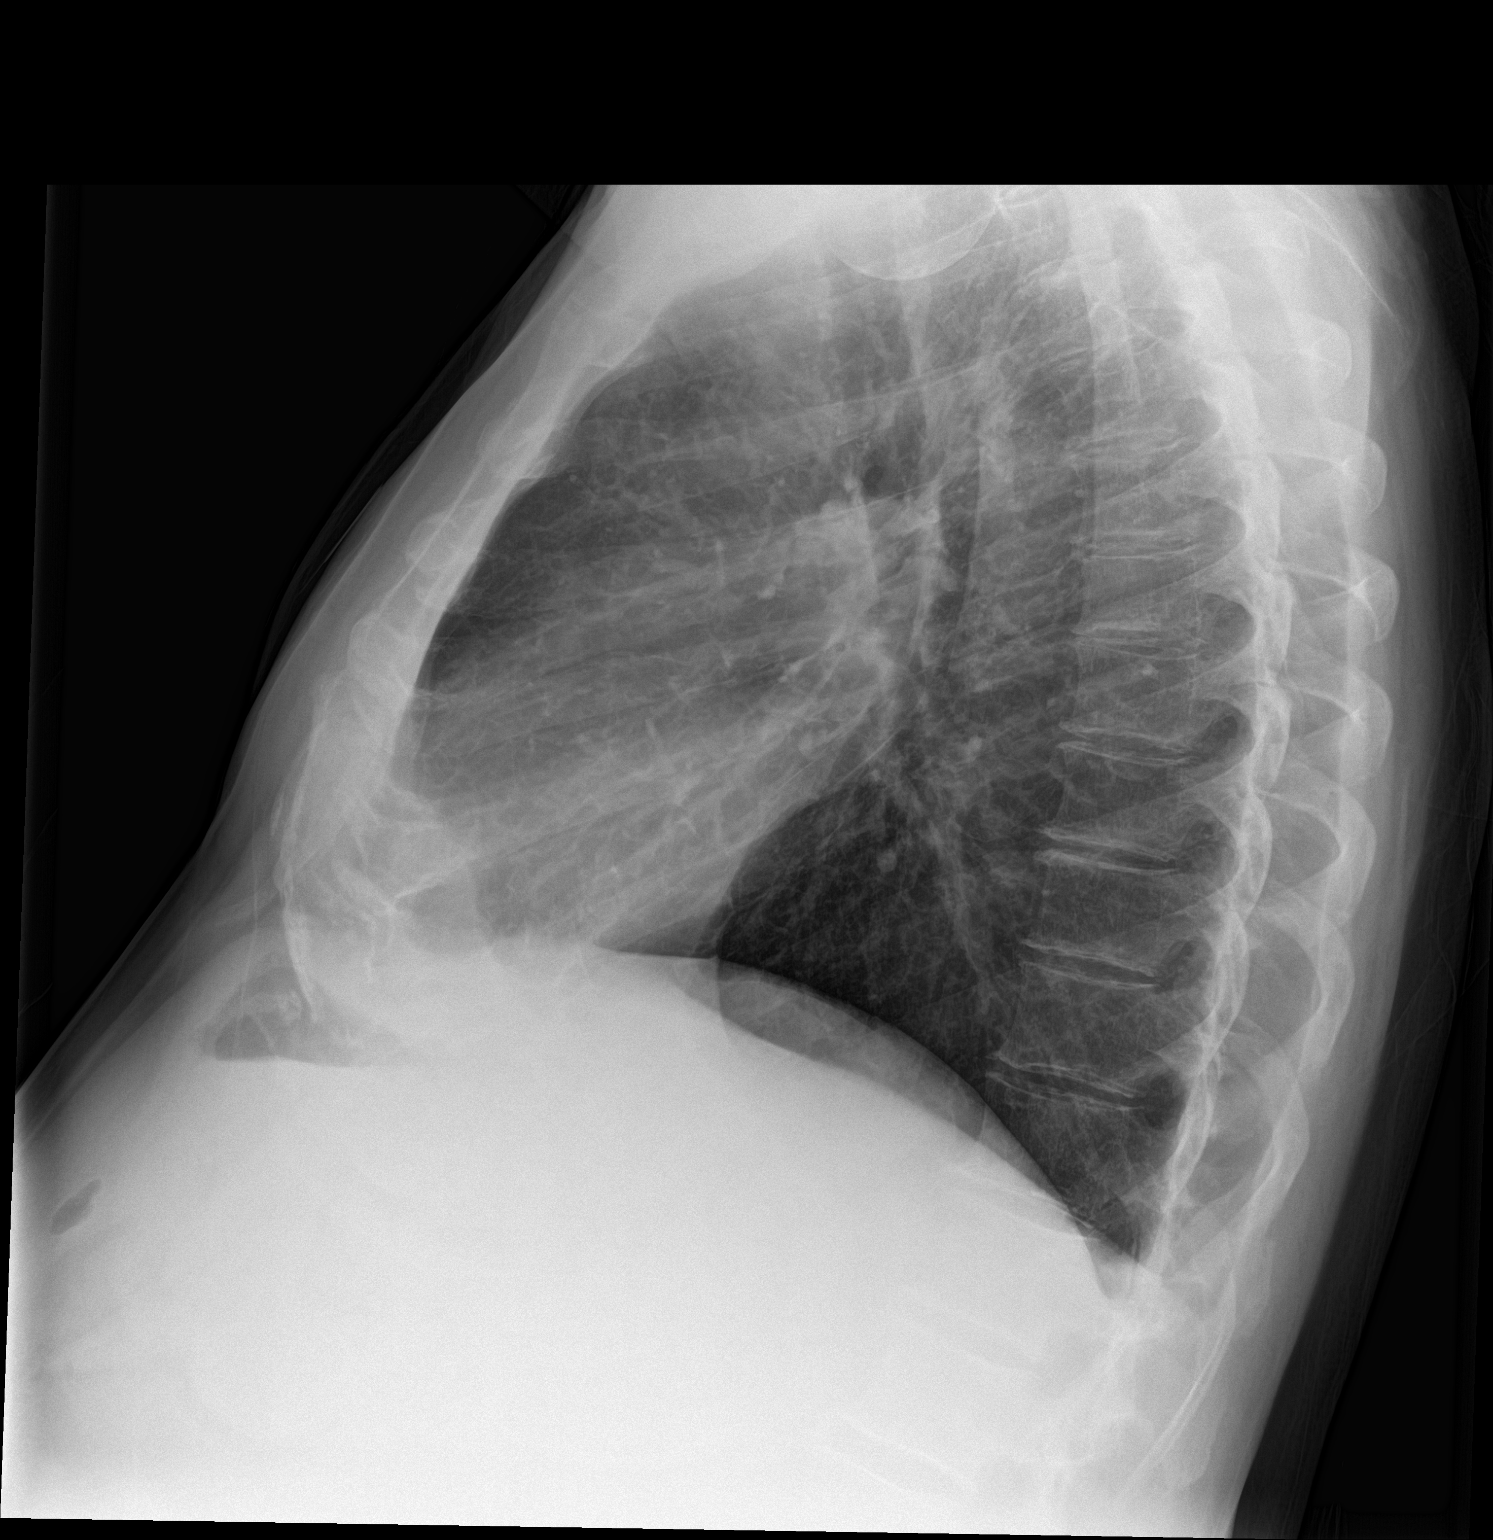

[2 of 2 positions shown; findings below may reference images not displayed]

FINDINGS: Heart size and pulmonary vascularity are normal. Slight
peribronchial thickening. No infiltrates or effusions. Minimal
scarring at the right base laterally. No bone abnormality.
IMPRESSION: Slight bronchitic changes.

## 2015-07-23 MED ORDER — CETYLPYRIDINIUM CHLORIDE 0.05 % MT LIQD
7.0000 mL | Freq: Two times a day (BID) | OROMUCOSAL | Status: DC
  Administered 2015-07-24 – 2015-07-27 (×8): 7 mL via OROMUCOSAL

## 2015-07-23 MED ORDER — PREDNISONE 20 MG PO TABS
30.0000 mg | ORAL_TABLET | Freq: Every day | ORAL | Status: DC
  Administered 2015-07-24 – 2015-07-28 (×5): 30 mg via ORAL
  Filled 2015-07-23 (×6): qty 1

## 2015-07-23 MED ORDER — MAGIC MOUTHWASH
5.0000 mL | Freq: Four times a day (QID) | ORAL | Status: DC | PRN
  Administered 2015-07-25 (×2): 5 mL via ORAL
  Filled 2015-07-23 (×3): qty 5

## 2015-07-23 MED ORDER — NICOTINE 21 MG/24HR TD PT24
21.0000 mg | MEDICATED_PATCH | Freq: Every day | TRANSDERMAL | Status: DC
  Administered 2015-07-23 – 2015-07-28 (×6): 21 mg via TRANSDERMAL
  Filled 2015-07-23 (×6): qty 1

## 2015-07-23 MED ORDER — CHLORHEXIDINE GLUCONATE 0.12 % MT SOLN
15.0000 mL | Freq: Two times a day (BID) | OROMUCOSAL | Status: DC
  Administered 2015-07-23 – 2015-07-28 (×10): 15 mL via OROMUCOSAL
  Filled 2015-07-23 (×9): qty 15

## 2015-07-23 MED ORDER — ONDANSETRON HCL 4 MG/2ML IJ SOLN: 4.0000 mg | Freq: Four times a day (QID) | INTRAMUSCULAR | Status: DC | PRN

## 2015-07-23 MED ORDER — DEXTROSE 5 % IV SOLN
1.0000 g | INTRAVENOUS | Status: DC
  Administered 2015-07-24 – 2015-07-27 (×4): 1 g via INTRAVENOUS
  Filled 2015-07-23 (×6): qty 10

## 2015-07-23 MED ORDER — ACETAMINOPHEN 500 MG PO TABS: 500.0000 mg | ORAL_TABLET | Freq: Four times a day (QID) | ORAL | Status: DC | PRN

## 2015-07-23 MED ORDER — SODIUM CHLORIDE 0.9% FLUSH
3.0000 mL | Freq: Two times a day (BID) | INTRAVENOUS | Status: DC
  Administered 2015-07-23 – 2015-07-28 (×8): 3 mL via INTRAVENOUS

## 2015-07-23 MED ORDER — SODIUM CHLORIDE 0.9 % IV SOLN
INTRAVENOUS | Status: DC
  Administered 2015-07-23: via INTRAVENOUS

## 2015-07-23 MED ORDER — ONDANSETRON HCL 4 MG PO TABS: 4.0000 mg | ORAL_TABLET | Freq: Four times a day (QID) | ORAL | Status: DC | PRN

## 2015-07-23 MED ORDER — SODIUM CHLORIDE 0.9 % IV SOLN
10.0000 mL/h | Freq: Once | INTRAVENOUS | Status: AC
  Administered 2015-07-23: 10 mL/h via INTRAVENOUS

## 2015-07-23 MED ORDER — DEXTROSE 5 % IV SOLN
500.0000 mg | INTRAVENOUS | Status: AC
  Administered 2015-07-23 – 2015-07-27 (×5): 500 mg via INTRAVENOUS
  Filled 2015-07-23 (×6): qty 500

## 2015-07-23 MED ORDER — OXYCODONE HCL 5 MG PO TABS
5.0000 mg | ORAL_TABLET | ORAL | Status: DC | PRN
  Administered 2015-07-23 – 2015-07-28 (×8): 5 mg via ORAL
  Filled 2015-07-23 (×8): qty 1

## 2015-07-23 MED ORDER — DEXTROSE 5 % IV SOLN
1.0000 g | Freq: Once | INTRAVENOUS | Status: AC
  Administered 2015-07-23: 1 g via INTRAVENOUS
  Filled 2015-07-23: qty 10

## 2015-07-23 MED ORDER — LIDOCAINE VISCOUS 2 % MT SOLN
15.0000 mL | OROMUCOSAL | Status: DC | PRN
  Filled 2015-07-23: qty 15

## 2015-07-23 MED ORDER — CEPASTAT 14.5 MG MT LOZG: 1.0000 | LOZENGE | OROMUCOSAL | Status: DC | PRN

## 2015-07-23 NOTE — ED Notes (Signed)
Pt presents with complaints of shortness of breath and chest pain. Pt states has been coughing up white phlegm. Pt voice is hoarse. Pt states has sores in mouth and his arthritis doctor told him to come to the ED.

## 2015-07-23 NOTE — H&P (Signed)
[place] [facility] [place] NAME: Marcus [name]    MR#:  [id number]  DATE OF BIRTH:  15-Jan-1961  DATE OF ADMISSION:  07/23/2015  PRIMARY CARE PHYSICIAN: No PCP Per Patient   REQUESTING/REFERRING PHYSICIAN: Dr. Meade Maw  CHIEF COMPLAINT:   Chief Complaint  Patient presents with  . Shortness of Breath  . Chest Pain    HISTORY OF PRESENT ILLNESS:  Marcus [name]  is a 55 y.o. male with a known history of sero positive rheumatoid arthritis, Felty's syndrome with hand cytopenia, hepatosplenomegaly presents to the hospital secondary to worsening hoarseness of his voice and also cough symptoms. Patient was admitted here March 2016 and was discharged on increased dose of prednisone for his worsening pancytopenia at the time. He hasn't followed up with hematologist for a long time. He used to follow-up with [facility] hematology Dr. Sterling Big before, seen by Dr. Mike Gip during last admission- since then hasn't followed with. Seen his Rheumatologist last in Dec 2016 and was on methotrexate, folic acid and prednisone then. Plans were to start rituxan instead of methotrexate, however patient didn't follow up further. His last labs indicate, his wbc is around 0.7k, platelets 90k and hb in the range of 10. For the past week he has been having hoarseness of his voice with worsening cough and upper respiratory tract infection symptoms. He felt like his tongue was on fire, cracks in his mouth and also mouth ulcerations that causing him to decrease his oral intake. His cough and his mouth symptoms were getting worse and so presented to the emergency room. Noted to be pancytopenic with drop in hemoglobin to 6.9. Also CXR with bronchitis findings.   PAST MEDICAL HISTORY:   Past Medical History  Diagnosis Date  . Felty syndrome ([place])   . Pancytopenia ([place])   . Seropositive rheumatoid arthritis ([place])   . Tobacco use disorder     PAST SURGICAL HISTORY:  History  reviewed. No pertinent past surgical history.  No surgeries noted.  SOCIAL HISTORY:   Social History  Substance Use Topics  . Smoking status: Current Every Day Smoker -- 1.00 packs/day    Types: Cigarettes  . Smokeless tobacco: Not on file  . Alcohol Use: No    FAMILY HISTORY:   Family History  Problem Relation Age of Onset  . CAD Brother   . COPD Brother     DRUG ALLERGIES:  No Known Allergies  REVIEW OF SYSTEMS:   Review of Systems  Constitutional: Positive for malaise/fatigue. Negative for fever, chills and weight loss.  HENT: Positive for sore throat. Negative for ear discharge, ear pain, hearing loss and nosebleeds.   Eyes: Negative for blurred vision, double vision and photophobia.  Respiratory: Positive for cough and shortness of breath. Negative for hemoptysis and wheezing.   Cardiovascular: Positive for chest pain. Negative for palpitations, orthopnea and leg swelling.  Gastrointestinal: Positive for nausea and abdominal pain. Negative for heartburn, vomiting, diarrhea, constipation and melena.  Genitourinary: Negative for dysuria, urgency and frequency.       Hesitantcy  Musculoskeletal: Negative for myalgias, back pain and neck pain.  Skin: Negative for rash.  Neurological: Negative for dizziness, tremors, sensory change, speech change, focal weakness and headaches.  Endo/Heme/Allergies: Does not bruise/bleed easily.  Psychiatric/Behavioral: Negative for depression.    MEDICATIONS AT HOME:   Prior to Admission medications   Medication Sig Start Date End Date Taking? Authorizing Provider  acetaminophen (TYLENOL) 500 MG tablet Take 500 mg  by mouth every 6 (six) hours as needed for mild pain or headache.   Yes Historical Provider, MD  predniSONE (DELTASONE) 10 MG tablet Take 10 mg by mouth daily with breakfast.   Yes Historical Provider, MD      VITAL SIGNS:  Blood pressure 122/70, pulse 117, temperature 98.2 F (36.8 C), temperature source Oral, resp.  rate 18, height 5\' 10"  (1.778 m), weight 83.915 kg (185 lb), SpO2 98 %.  PHYSICAL EXAMINATION:   Physical Exam  GENERAL:  55 y.o.-year-old patient lying in the bed with no acute distress.  EYES: Pupils equal, round, reactive to light and accommodation. No scleral icterus. Extraocular muscles intact.  HEENT: Head atraumatic, normocephalic. Oropharynx and nasopharynx clear. Red cracked tongue noted, no mucosal ulcerations seen, erythema in buccal mucosa noted. NECK:  Supple, no jugular venous distention. No thyroid enlargement, no tenderness.  LUNGS: Normal breath sounds bilaterally, no wheezing, rales,rhonchi or crepitation. No use of accessory muscles of respiration. Decreased bibasilar breath sounds CARDIOVASCULAR: S1, S2 normal. No  rubs, or gallops. 2/6 systolic murmur present. ABDOMEN: Soft, enlarged spleen extending up to umbilicus almost seen. RUQ fullness and discomfort noted as well. Bowel sounds present.  EXTREMITIES: No pedal edema, cyanosis, or clubbing.  NEUROLOGIC: Cranial nerves II through XII are intact. Muscle strength 5/5 in all extremities. Sensation intact. Gait not checked.  PSYCHIATRIC: The patient is alert and oriented x 3.  SKIN: No obvious rash, lesion, or ulcer. Blanchable petechiae on the legs  LABORATORY PANEL:   CBC  Recent Labs Lab 07/23/15 1514  WBC 0.4*  HGB 6.9*  HCT 21.4*  PLT 85*   ------------------------------------------------------------------------------------------------------------------  Chemistries   Recent Labs Lab 07/23/15 1514  NA 130*  K 3.9  CL 100*  CO2 22  GLUCOSE 137*  BUN 10  CREATININE 0.81  CALCIUM 7.9*   ------------------------------------------------------------------------------------------------------------------  Cardiac Enzymes  Recent Labs Lab 07/23/15 1514  TROPONINI <0.03    ------------------------------------------------------------------------------------------------------------------  RADIOLOGY:  Dg Chest 2 View  07/23/2015  CLINICAL DATA:  Chest pain and shortness of breath. Productive cough. EXAM: CHEST  2 VIEW COMPARISON:  06/14/2014 and 10/21/2012 FINDINGS: Heart size and pulmonary vascularity are normal. Slight peribronchial thickening. No infiltrates or effusions. Minimal scarring at the right base laterally. No bone abnormality. IMPRESSION: Slight bronchitic changes. Electronically Signed   By: Lorriane Shire M.D.   On: 07/23/2015 15:36    EKG:   Orders placed or performed during the hospital encounter of 07/23/15  . ED EKG within 10 minutes  . ED EKG within 10 minutes  . EKG 12-Lead  . EKG 12-Lead  . EKG 12-Lead  . EKG 12-Lead    IMPRESSION AND PLAN:   Marcus [name]  is a 55 y.o. male with a known history of sero positive rheumatoid arthritis, Felty's syndrome with hand cytopenia, hepatosplenomegaly presents to the hospital secondary to worsening hoarseness of his voice and also cough symptoms.  #1 Acute bronchitis- blood cultures, oral cepacol lozenges - rocephin and azithromycin  #2 Pancytopenia- Hematology consulted, Platelets are at baseline- monitor, No tx unless <20 k or active bleeding WBC also close to baseline- methotrexate - patient not taking Follow hematology recommendations On prednisone Transfuse pRBC as hb <7.0 and monitor  #3 Felty's syndrome- Hematology f/u inpatient and also outpatient On prednisone for now spleenomegaly with abdominal discomfort  #4 Tobacco use disorder- counseled- not ready to quit. On nicotine patch  #5 Seropositive RA- encourage outpt follow up with Rheumatology. Increased prednisone dose now- methotrexate remains on  hold. Plan was to start Rituxan- outpatient follow up recommended  #6 DVT Prophylaxis- TEds and SCDs with his anemia and thrombocytopenia- avoid heparin products    All the  records are reviewed and case discussed with ED provider. Management plans discussed with the patient, family and they are in agreement.  CODE STATUS: Full Code  TOTAL TIME TAKING CARE OF THIS PATIENT: 50 minutes.    Gladstone Lighter M.D on 07/23/2015 at 5:05 PM  Between 7am to 6pm - Pager - [phone]  After 6pm go to [email or web address] - password [email or web address] [facility]  [place] [facility]  Office  [phone]  CC: Primary care physician; No PCP Per Patient

## 2015-07-23 NOTE — ED Notes (Addendum)
Pt reports sores in mouth and difficulty breathing and inability to speak clearly - has non productive dry cough - pt reports unable to eat due to sores in mouth (once a day) - MD Marcelene Butte at bedside

## 2015-07-23 NOTE — ED Notes (Signed)
Reported WBC count of 0.4 to Dr Marcelene Butte

## 2015-07-23 NOTE — Progress Notes (Signed)
Patient refused bed alarm.  Put him as high fall risk due to low hemoglobin.  Christene Slates  07/23/2015  11:03 PM

## 2015-07-23 NOTE — Progress Notes (Signed)
Pharmacy Antibiotic Note  Marcus [name] is a 55 y.o. male admitted on 07/23/2015 with pneumonia.  Pharmacy has been consulted for ceftriaxone dosing.  Plan: Received ceftriaxone and azithromycin in ED. Ordered azithromycin 500mg  IV Q24H, will continue ceftriaxone 1gm IV Q24H  Height: 5\' 10"  (177.8 cm) Weight: 161 lb 9.6 oz (73.301 kg) IBW/kg (Calculated) : 73  Temp (24hrs), Avg:98.2 F (36.8 C), Min:98.2 F (36.8 C), Max:98.2 F (36.8 C)   Recent Labs Lab 07/23/15 1514  WBC 0.4*  CREATININE 0.81    Estimated Creatinine Clearance: 107.6 mL/min (by C-G formula based on Cr of 0.81).    No Known Allergies  Antimicrobials this admission: ceftriaxone 4/17 >>  azithromycin 4/17 >>   Microbiology results: 4/17 BCx:   Thank you for allowing pharmacy to be a part of this patient's care.  Aleena Kirkeby C 07/23/2015 7:44 PM

## 2015-07-23 NOTE — ED Notes (Signed)
Blood bank called about blood transfusion and whether we would be doing it in the ER or once he got to the floor - spoke to [employer] charge nurse and due to the fact that it was blood type specific it would need to be given on the floor - blood bank notified

## 2015-07-23 NOTE — ED Provider Notes (Addendum)
Time Seen: Approximately 1550 I have reviewed the triage notes  Chief Complaint: Shortness of Breath and Chest Pain   History of Present Illness: Marcus [name] is a 55 y.o. male *who presents with a persistent cough, mild shortness of breath, worse for some mouth pain. Patient has a history of Felty's syndrome with pancytopenia. He is describing some generalized fatigue and lightheadedness. He states he had a similar episode approximately one year ago with similar symptoms. He has some chronic splenomegaly. He is not aware of any fever at home he states he just had his white count checked at his primary referral doctor's office recently and was 0.8. He was referred here by his rheumatologist. His chest pain is mainly crampy right side is actually into the abdominal area and is worse with coughing. He denies any persistent nausea vomiting or diarrhea. He denies any melena or hematochezia.   Past Medical History  Diagnosis Date  . Felty syndrome ([place])   . Pancytopenia [facility])     Patient Active Problem List   Diagnosis Date Noted  . Leukopenia 10/21/2012  . 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 Abnormal EKG 10/21/2012  . Joint pain 10/21/2012  . Splenomegaly 10/21/2012    History reviewed. No pertinent past surgical history.  History reviewed. No pertinent past surgical history.  Current Outpatient Rx  Name  Route  Sig  Dispense  Refill  . acetaminophen (PAIN RELIEVER) 325 MG tablet   Oral   Take 325 mg by mouth daily as needed for pain.         [name] doxycycline (VIBRA-TABS) 100 MG tablet   Oral   Take 1 tablet (100 mg total) by mouth 2 (two) times daily. For 14 days starting 7/18 at 9AM   28 tablet   0   . OxyCODONE HCl, Abuse Deter, 5 MG TABA   Oral   Take 5 mg by mouth 3 (three) times daily as needed (for pain).   15 tablet   0     Allergies:  Review of patient's allergies indicates no known allergies.  Family History: No family history on file.  Social History: Social  History  Substance Use Topics  . Smoking status: Current Every Day Smoker -- 1.00 packs/day    Types: Cigarettes  . Smokeless tobacco: None  . Alcohol Use: No     Review of Systems:   10 point review of systems was performed and was otherwise negative:  Constitutional: No fever Eyes: No visual disturbances ENT: Patient's able to swallow without discomfort denies any ear pain does have some laryngitis Cardiac: No chest pain to this historian other than with coughing Respiratory: Mild shortness of breath, no wheezing, or stridor. Abdomen: No abdominal pain, no vomiting, No diarrhea Endocrine: No weight loss, No night sweats Extremities: No peripheral edema, cyanosis Skin: No rashes, easy bruising Neurologic: No focal weakness, trouble with speech or swollowing Urologic: No dysuria, Hematuria, or urinary frequency   Physical Exam:  ED Triage Vitals  Enc Vitals Group     BP 07/23/15 1510 122/70 mmHg     Pulse Rate 07/23/15 1510 117     Resp 07/23/15 1510 18     Temp 07/23/15 1510 98.2 F (36.8 C)     Temp Source 07/23/15 1510 Oral     SpO2 07/23/15 1510 98 %     Weight 07/23/15 1510 185 lb (83.915 kg)     Height 07/23/15 1510 5\' 10"  (1.778 m)     Head Cir --  Peak Flow --      Pain Score 07/23/15 1510 8     Pain Loc --      Pain Edu? --      Excl. in [place]? --     General: Awake , Alert , and Oriented times 3; GCS 15 With a horse 4 days Head: Normal cephalic , atraumatic Eyes: Pupils equal , round, reactive to light Nose/Throat: No nasal drainage, patent upper airway without erythema or exudate. He has some mild oral lesions noted especially underneath his tongue. No thrush is noted Neck: Supple, Full range of motion, No anterior adenopathy or palpable thyroid masses Lungs: Coarse rhonchi are auscultated right lower lobe posteriorly no wheezes or rales are noted Heart: Regular rate, regular rhythm without murmurs , gallops , or rubs Abdomen: Soft, non tender without  rebound, guarding , or rigidity; bowel sounds positive and symmetric in all 4 quadrants. Splenomegaly    Extremities: 2 plus symmetric pulses. No edema, clubbing or cyanosis Neurologic: normal ambulation, Motor symmetric without deficits, sensory intact Skin: warm, dry, no rashes   Labs:   All laboratory work was reviewed including any pertinent negatives or positives listed below:  Labs Reviewed  BASIC METABOLIC PANEL - Abnormal; Notable for the following:    Sodium 130 (*)    Chloride 100 (*)    Glucose, Bld 137 (*)    Calcium 7.9 (*)    All other components within normal limits  CBC - Abnormal; Notable for the following:    WBC 0.4 (*)    RBC 3.23 (*)    Hemoglobin 6.9 (*)    HCT 21.4 (*)    MCV 66.3 (*)    MCH 21.3 (*)    RDW 22.0 (*)    Platelets 85 (*)    All other components within normal limits  CULTURE, BLOOD (ROUTINE X 2)  CULTURE, BLOOD (ROUTINE X 2)  TROPONIN I  TSH  TYPE AND SCREEN  PREPARE RBC (CROSSMATCH)  Reviewed the patient's laboratory work is significant for a low white blood cell count, low hemoglobin, with pancytopenia with some low platelets  Radiology: *    Final result by Rad Results In Interface (07/23/15 15:36:00)   Narrative:   CLINICAL DATA: Chest pain and shortness of breath. Productive cough.  EXAM: CHEST 2 VIEW  COMPARISON: 06/14/2014 and 10/21/2012  FINDINGS: Heart size and pulmonary vascularity are normal. Slight peribronchial thickening. No infiltrates or effusions. Minimal scarring at the right base laterally. No bone abnormality.  IMPRESSION: Slight bronchitic changes.   Electronically Signed By: Lorriane Shire M.D. On: 07/23/2015 15:36    ED ECG REPORT I, Daymon Larsen, the attending physician, personally viewed and interpreted this ECG.  Date: 07/23/2015 EKG Time: 1516 Rate: 110 Rhythm: Sinus tachycardia QRS Axis: normal Intervals: normal ST/T Wave abnormalities: Nonspecific ST-T wave abnormality  may be rate dependent Conduction Disturbances: none Narrative Interpretation: unremarkable   I personally reviewed the radiologic studies  CRITICAL CARE Performed by: Daymon Larsen   Total critical care time: 33 minutes  Critical care time was exclusive of separately billable procedures and treating other patients.  Critical care was necessary to treat or prevent imminent or life-threatening deterioration.  Critical care was time spent personally by me on the following activities: development of treatment plan with patient and/or surrogate as well as nursing, discussions with consultants, evaluation of patient's response to treatment, examination of patient, obtaining history from patient or surrogate, ordering and performing treatments and interventions, ordering and review  of laboratory studies, ordering and review of radiographic studies, pulse oximetry and re-evaluation of patient's condition. Initiation of blood transfusion and workup and evaluation for pancytopenia   ED Course: Patient be initiated on blood transfusion and I started patient on Rocephin IV for presumed community-acquired pneumonia. His chest x-ray does not show any real significant findings on his exam shows some right-sided rhonchi. With his current immunocompromise state especially with thousand splenomegaly, etc. I felt he would benefit with some antibiotic therapy initiated after blood cultures. Patient will also be transfused which I felt would help him symptomatically.   Assessment:  Pancytopenia Possible community-acquired pneumonia      Plan: * Inpatient management           Daymon Larsen, MD 07/23/15 1614  Daymon Larsen, MD 07/23/15 [phone]

## 2015-07-24 DIAGNOSIS — J40 Bronchitis, not specified as acute or chronic: Secondary | ICD-10-CM

## 2015-07-24 DIAGNOSIS — R5383 Other fatigue: Secondary | ICD-10-CM

## 2015-07-24 DIAGNOSIS — D61818 Other pancytopenia: Secondary | ICD-10-CM

## 2015-07-24 DIAGNOSIS — M0500 Felty's syndrome, unspecified site: Secondary | ICD-10-CM

## 2015-07-24 DIAGNOSIS — Z79899 Other long term (current) drug therapy: Secondary | ICD-10-CM

## 2015-07-24 DIAGNOSIS — E440 Moderate protein-calorie malnutrition: Secondary | ICD-10-CM | POA: Insufficient documentation

## 2015-07-24 DIAGNOSIS — E43 Unspecified severe protein-calorie malnutrition: Secondary | ICD-10-CM | POA: Insufficient documentation

## 2015-07-24 DIAGNOSIS — R634 Abnormal weight loss: Secondary | ICD-10-CM

## 2015-07-24 DIAGNOSIS — Z7952 Long term (current) use of systemic steroids: Secondary | ICD-10-CM

## 2015-07-24 DIAGNOSIS — F17210 Nicotine dependence, cigarettes, uncomplicated: Secondary | ICD-10-CM

## 2015-07-24 LAB — CBC
HEMATOCRIT: 21 % — AB (ref 40.0–52.0)
Hemoglobin: 6.7 g/dL — ABNORMAL LOW (ref 13.0–18.0)
MCH: 21.4 pg — ABNORMAL LOW (ref 26.0–34.0)
MCHC: 32.1 g/dL (ref 32.0–36.0)
MCV: 66.7 fL — ABNORMAL LOW (ref 80.0–100.0)
Platelets: 76 10*3/uL — ABNORMAL LOW (ref 150–440)
RBC: 3.15 MIL/uL — ABNORMAL LOW (ref 4.40–5.90)
RDW: 22.2 % — AB (ref 11.5–14.5)
WBC: 0.6 10*3/uL — CL (ref 3.8–10.6)

## 2015-07-24 LAB — BASIC METABOLIC PANEL
Anion gap: 4 — ABNORMAL LOW (ref 5–15)
BUN: 8 mg/dL (ref 6–20)
CALCIUM: 7.5 mg/dL — AB (ref 8.9–10.3)
CO2: 25 mmol/L (ref 22–32)
CREATININE: 0.61 mg/dL (ref 0.61–1.24)
Chloride: 104 mmol/L (ref 101–111)
GFR calc Af Amer: >60 mL/min (ref >60)
GLUCOSE: 80 mg/dL (ref 65–99)
POTASSIUM: 3.9 mmol/L (ref 3.5–5.1)
SODIUM: 133 mmol/L — AB (ref 135–145)

## 2015-07-24 LAB — DIFFERENTIAL
BLASTS: 0 %
Band Neutrophils: 5 %
Basophils Absolute: 0 10*3/uL (ref 0.0–0.1)
Basophils Relative: 0 %
EOS PCT: 1 %
Eosinophils Absolute: 0 10*3/uL (ref 0.0–0.7)
Lymphocytes Relative: 28 %
Lymphs Abs: 0.1 10*3/uL — ABNORMAL LOW (ref 0.7–4.0)
MONOS PCT: 14 %
MYELOCYTES: 0 %
Metamyelocytes Relative: 2 %
Monocytes Absolute: 0.1 10*3/uL (ref 0.1–1.0)
NRBC: 2 /100{WBCs} — AB
Neutro Abs: 0.2 10*3/uL — ABNORMAL LOW (ref 1.7–7.7)
Neutrophils Relative %: 50 %
OTHER: 0 %
Promyelocytes Absolute: 0 %

## 2015-07-24 LAB — LACTATE DEHYDROGENASE: LDH: 196 U/L — ABNORMAL HIGH (ref 98–192)

## 2015-07-24 LAB — PREPARE RBC (CROSSMATCH)

## 2015-07-24 LAB — HEMOGLOBIN A1C

## 2015-07-24 LAB — HEMOGLOBIN: Hemoglobin: 6.8 g/dL — ABNORMAL LOW (ref 13.0–18.0)

## 2015-07-24 MED ORDER — SODIUM CHLORIDE 0.9 % IV SOLN
Freq: Once | INTRAVENOUS | Status: AC
  Administered 2015-07-24: 17:00:00 via INTRAVENOUS

## 2015-07-24 MED ORDER — POTASSIUM CHLORIDE IN NACL 20-0.9 MEQ/L-% IV SOLN
INTRAVENOUS | Status: DC
  Administered 2015-07-24 – 2015-07-28 (×7): via INTRAVENOUS
  Filled 2015-07-24 (×10): qty 1000

## 2015-07-24 MED ORDER — ENSURE ENLIVE PO LIQD
237.0000 mL | Freq: Two times a day (BID) | ORAL | Status: DC
  Administered 2015-07-24 – 2015-07-26 (×5): 237 mL via ORAL

## 2015-07-24 MED ORDER — SODIUM CHLORIDE 0.9 % IV SOLN
Freq: Once | INTRAVENOUS | Status: AC
  Administered 2015-07-24: 07:00:00 via INTRAVENOUS

## 2015-07-24 NOTE — Progress Notes (Signed)
[place] [facility] [place] NAME: Marcus [name]    MR#:  [id number]  DATE OF BIRTH:  1960/10/02  SUBJECTIVE:  CHIEF COMPLAINT:   Chief Complaint  Patient presents with  . Shortness of Breath  . Chest Pain   the patient is a 55 year old Caucasian male with past medical history significant for history of rheumatid arthritis, Felty syndrome, pancytopenia, hepatosplenomegaly, who presents to the hospital with worsening hoarseness, cough. In emergency room, he was noted to have significant pancytopenia and admitted to the hospital for further evaluation and treatment. The patient feels somewhat better today, still has hoarseness, some cough but denies fevers, pain. Patient is being transfused second packed red blood cell unit, hemoglobin level after transfusion will be checked, pending. Patient complains of sore throat, buccal mucosa.   Review of Systems  Constitutional: Negative for fever, chills and weight loss.  HENT: Positive for sore throat. Negative for congestion.   Eyes: Negative for blurred vision and double vision.  Respiratory: Positive for cough, sputum production and stridor. Negative for shortness of breath and wheezing.   Cardiovascular: Negative for chest pain, palpitations, orthopnea, leg swelling and PND.  Gastrointestinal: Negative for nausea, vomiting, abdominal pain, diarrhea, constipation and blood in stool.  Genitourinary: Negative for dysuria, urgency, frequency and hematuria.  Musculoskeletal: Negative for falls.  Neurological: Negative for dizziness, tremors, focal weakness and headaches.  Endo/Heme/Allergies: Does not bruise/bleed easily.  Psychiatric/Behavioral: Negative for depression. The patient does not have insomnia.     VITAL SIGNS: Blood pressure 108/61, pulse 100, temperature 99.3 F (37.4 C), temperature source Oral, resp. rate 22, height 5\' 10"  (1.778 m), weight 73.301 kg (161 lb 9.6 oz), SpO2 96 %.  PHYSICAL  EXAMINATION:   GENERAL:  55 y.o.-year-old patient lying in the bed with no acute distress. Oral mucosa has erosions, especially in the buccal surface EYES: Pupils equal, round, reactive to light and accommodation. No scleral icterus. Extraocular muscles intact.  HEENT: Head atraumatic, normocephalic. Oropharynx and nasopharynx clear.  NECK:  Supple, no jugular venous distention. No thyroid enlargement, no tenderness.  LUNGS: Normal breath sounds bilaterally, no wheezing, rales,rhonchi or crepitation. No use of accessory muscles of respiration.  CARDIOVASCULAR: S1, S2 normal. No murmurs, rubs, or gallops.  ABDOMEN: Soft, nontender, nondistended. Bowel sounds present. No organomegaly or mass.  EXTREMITIES: No pedal edema, cyanosis, or clubbing.  NEUROLOGIC: Cranial nerves II through XII are intact. Muscle strength 5/5 in all extremities. Sensation intact. Gait not checked.  PSYCHIATRIC: The patient is alert and oriented x 3.  SKIN: No obvious rash, lesion, or ulcer.   ORDERS/RESULTS REVIEWED:   CBC  Recent Labs Lab 07/23/15 1514 07/24/15 0525  WBC 0.4* 0.6*  HGB 6.9* 6.7*  HCT 21.4* 21.0*  PLT 85* 76*  MCV 66.3* 66.7*  MCH 21.3* 21.4*  MCHC 32.2 32.1  RDW 22.0* 22.2*   ------------------------------------------------------------------------------------------------------------------  Chemistries   Recent Labs Lab 07/23/15 1514 07/24/15 0525  NA 130* 133*  K 3.9 3.9  CL 100* 104  CO2 22 25  GLUCOSE 137* 80  BUN 10 8  CREATININE 0.81 0.61  CALCIUM 7.9* 7.5*   ------------------------------------------------------------------------------------------------------------------ estimated creatinine clearance is 109 mL/min (by C-G formula based on Cr of 0.61). ------------------------------------------------------------------------------------------------------------------  Recent Labs  07/23/15 1514  TSH 1.910    Cardiac Enzymes  Recent Labs Lab 07/23/15 1514   TROPONINI <0.03   ------------------------------------------------------------------------------------------------------------------ Invalid input(s): POCBNP ---------------------------------------------------------------------------------------------------------------  RADIOLOGY: Dg Chest 2 View  07/23/2015  CLINICAL DATA:  Chest pain and shortness of breath. Productive cough. EXAM: CHEST  2 VIEW COMPARISON:  06/14/2014 and 10/21/2012 FINDINGS: Heart size and pulmonary vascularity are normal. Slight peribronchial thickening. No infiltrates or effusions. Minimal scarring at the right base laterally. No bone abnormality. IMPRESSION: Slight bronchitic changes. Electronically Signed   By: Lorriane Shire M.D.   On: 07/23/2015 15:36    EKG:  Orders placed or performed during the hospital encounter of 07/23/15  . ED EKG within 10 minutes  . ED EKG within 10 minutes  . EKG 12-Lead  . EKG 12-Lead  . EKG 12-Lead  . EKG 12-Lead    ASSESSMENT AND PLAN:  Active Problems:   Pancytopenia ([facility])   Protein-calorie malnutrition, severe  #1. Pancytopenia, patient is being transfused packed red blood cells, follow hemoglobin level and transfusing as needed, neutropenic precautions will be initiated, oncology consultation is requested, continue steroids, supportive therapy. #2, protein calorie malnutrition, severe, get dietary consultation #3. Acute bronchitis/laryngitis, continue antibiotic therapy, get sputum cultures if possible, blood cultures are negative so far #4. Hyponatremia, improving with IV fluid administration, follow in the morning. #5 . Hyperglycemia, get hemoglobin A1c #6. Relative hypotension, continue IV fluids, transfusion with packed red blood cells, follow blood pressure readings closely. #7. Generalized weakness, physical therapy consultation will be requested Management plans discussed with the patient, family and they are in agreement.   DRUG ALLERGIES: No Known  Allergies  CODE STATUS:     Code Status Orders        Start     Ordered   07/23/15 1924  Full code   Continuous     07/23/15 1923    Code Status History    Date Active Date Inactive Code Status Order ID Comments User Context   This patient has a current code status but no historical code status.      TOTAL TIME TAKING CARE OF THIS PATIENT: 40 minutes.    [name] M.D on 07/24/2015 at 1:04 PM  Between 7am to 6pm - Pager - [phone]  After 6pm go to [email or web address] - password [email or web address]  [place] [facility]  Office  [phone]  CC: Primary care physician; No PCP Per Patient

## 2015-07-24 NOTE — Care Management (Signed)
Patient listed as self pay patient.  Transferred to 1c prior to completion of assessment. May need assistance with medications at discharge

## 2015-07-24 NOTE — Progress Notes (Signed)
Initial Nutrition Assessment  DOCUMENTATION CODES:   Severe malnutrition in context of acute illness/injury  INTERVENTION:  Monitor intake and await diet progression. Recommend regular diet when appropriate Recommend Ensure Enlive po BID, each supplement provides 350 kcal and 20 grams of protein    NUTRITION DIAGNOSIS:   Inadequate oral intake related to chronic illness as evidenced by per patient/family report.    GOAL:   Patient will meet greater than or equal to 90% of their needs    MONITOR:   PO intake, Supplement acceptance  REASON FOR ASSESSMENT:   Malnutrition Screening Tool    ASSESSMENT:   55 y/o male admitted with shortness of breath, chest pain, acute bronchitis, pneumonia, pancytopenia and mouth pain Past Medical History  Diagnosis Date  . 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 Pancytopenia ([place])   . Seropositive rheumatoid arthritis ([place])   . Tobacco use disorder     Pt reports poor po intake for the past 4 months but especially over the past month secondary to mouth pain and cracks in mouth.  Reports this am ate 100% of full liquid tray as appetite has improved.    Medications reviewed predinsone, NS at 70ml/hr, magic mouthwash  Labs reviewed Na 133, calcium 7.5  Nutrition-Focused physical exam completed. Findings are mild/moderate fat depletion, mild/moderate muscle depletion, and none edema.     Diet Order:  Diet full liquid Room service appropriate?: Yes; Fluid consistency:: Thin  Skin:  Reviewed, no issues  Last BM:  4/17  Height:   Ht Readings from Last 1 Encounters:  07/23/15 5\' 10"  (1.778 m)    Weight: Pt reports 15% wt loss in the last 4 months  Wt Readings from Last 1 Encounters:  07/23/15 161 lb 9.6 oz (73.301 kg)    Ideal Body Weight:     BMI:  Body mass index is 23.19 kg/(m^2).  Estimated Nutritional Needs:   Kcal:  1825-2190 kcals/d.   Protein:  73-88 g/d  Fluid:  1.8-2.0 L/d  EDUCATION NEEDS:   No education needs  identified at this time  Kiernan Farkas B. Zenia Resides, [place], [place] (pager) Weekend/On-Call pager [phone])

## 2015-07-24 NOTE — Clinical Social Work Note (Signed)
Clinical Social Worker consulted for assistance with medications. CSW updated RNCM, who will follow up with referral. CSW is signing off as no further needs identified.   Darden Dates, MSW, LCSW Clinical Social Worker  [phone]

## 2015-07-24 NOTE — Progress Notes (Signed)
Called Dr. Estanislado Pandy about patient's hemoglobin level of 6.7.  Doctor put in appropriate orders.  Christene Slates 07/24/2015  6:39 AM

## 2015-07-24 NOTE — Progress Notes (Signed)
MD order to transfer pt to Oncology Department. Blood transfusion complete. Hemoglobin to be drawn 1 hour post transfusion. Report called to [place], Therapist, sports.

## 2015-07-25 ENCOUNTER — Inpatient Hospital Stay: Payer: Self-pay

## 2015-07-25 ENCOUNTER — Other Ambulatory Visit: Payer: Self-pay | Admitting: Hematology and Oncology

## 2015-07-25 LAB — HEPATIC FUNCTION PANEL
ALK PHOS: 48 U/L (ref 38–126)
ALT: 11 U/L — AB (ref 17–63)
AST: 24 U/L (ref 15–41)
Albumin: 2 g/dL — ABNORMAL LOW (ref 3.5–5.0)
BILIRUBIN DIRECT: 0.1 mg/dL (ref 0.1–0.5)
BILIRUBIN INDIRECT: 0.7 mg/dL (ref 0.3–0.9)
BILIRUBIN TOTAL: 0.8 mg/dL (ref 0.3–1.2)
Total Protein: 7.4 g/dL (ref 6.5–8.1)

## 2015-07-25 LAB — OSMOLALITY, URINE: Osmolality, Ur: 215 mOsm/kg — ABNORMAL LOW (ref 300–900)

## 2015-07-25 LAB — TYPE AND SCREEN
ABO/RH(D): A POS
ANTIBODY SCREEN: NEGATIVE
UNIT DIVISION: 0
UNIT DIVISION: 0
Unit division: 0
Unit division: 0

## 2015-07-25 LAB — CBC WITH DIFFERENTIAL/PLATELET
Basophils Absolute: 0 10*3/uL (ref 0–0.1)
Basophils Relative: 1 %
Eosinophils Absolute: 0 10*3/uL (ref 0–0.7)
Eosinophils Relative: 3 %
HCT: 26.4 % — ABNORMAL LOW (ref 40.0–52.0)
Hemoglobin: 8.6 g/dL — ABNORMAL LOW (ref 13.0–18.0)
Lymphocytes Relative: 56 %
Lymphs Abs: 0.4 10*3/uL — ABNORMAL LOW (ref 1.0–3.6)
MCH: 22.6 pg — ABNORMAL LOW (ref 26.0–34.0)
MCHC: 32.7 g/dL (ref 32.0–36.0)
MCV: 69.1 fL — ABNORMAL LOW (ref 80.0–100.0)
Monocytes Absolute: 0.2 10*3/uL (ref 0.2–1.0)
Monocytes Relative: 20 %
Neutro Abs: 0.2 10*3/uL — ABNORMAL LOW (ref 1.4–6.5)
Neutrophils Relative %: 20 %
Platelets: 78 10*3/uL — ABNORMAL LOW (ref 150–440)
RBC: 3.82 MIL/uL — ABNORMAL LOW (ref 4.40–5.90)
RDW: 23.5 % — ABNORMAL HIGH (ref 11.5–14.5)
WBC: 0.8 10*3/uL — CL (ref 3.8–10.6)

## 2015-07-25 LAB — OCCULT BLOOD X 1 CARD TO LAB, STOOL: Fecal Occult Bld: NEGATIVE

## 2015-07-25 LAB — FOLATE: Folate: 8.1 ng/mL (ref >5.9)

## 2015-07-25 LAB — CBC
HCT: 26.2 % — ABNORMAL LOW (ref 40.0–52.0)
HEMOGLOBIN: 8.5 g/dL — AB (ref 13.0–18.0)
MCH: 22.5 pg — AB (ref 26.0–34.0)
MCHC: 32.6 g/dL (ref 32.0–36.0)
MCV: 69.2 fL — AB (ref 80.0–100.0)
PLATELETS: 71 10*3/uL — AB (ref 150–440)
RBC: 3.78 MIL/uL — ABNORMAL LOW (ref 4.40–5.90)
RDW: 23.5 % — ABNORMAL HIGH (ref 11.5–14.5)
WBC: 0.6 10*3/uL — CL (ref 3.8–10.6)

## 2015-07-25 LAB — IRON AND TIBC
Iron: 68 ug/dL (ref 45–182)
Saturation Ratios: 37 % (ref 17.9–39.5)
TIBC: 185 ug/dL — ABNORMAL LOW (ref 250–450)
UIBC: 117 ug/dL

## 2015-07-25 LAB — MISC LABCORP TEST (SEND OUT): LABCORP TEST CODE: 1453

## 2015-07-25 LAB — SODIUM: SODIUM: 131 mmol/L — AB (ref 135–145)

## 2015-07-25 LAB — SEDIMENTATION RATE: Sed Rate: 128 mm/hr — ABNORMAL HIGH (ref 0–20)

## 2015-07-25 LAB — VITAMIN B12: Vitamin B-12: 440 pg/mL (ref 180–914)

## 2015-07-25 LAB — FERRITIN: Ferritin: 267 ng/mL (ref 24–336)

## 2015-07-25 IMAGING — DX DG HAND 2V*L*
1 series · 1 of 1 positions shown · non-contrast
Comparison: None.

CLINICAL DATA: Pain and swelling.  History of rheumatoid arthritis.

EXAM:
LEFT HAND - 2 VIEW; RIGHT HAND - 2 VIEW

[hand ap]
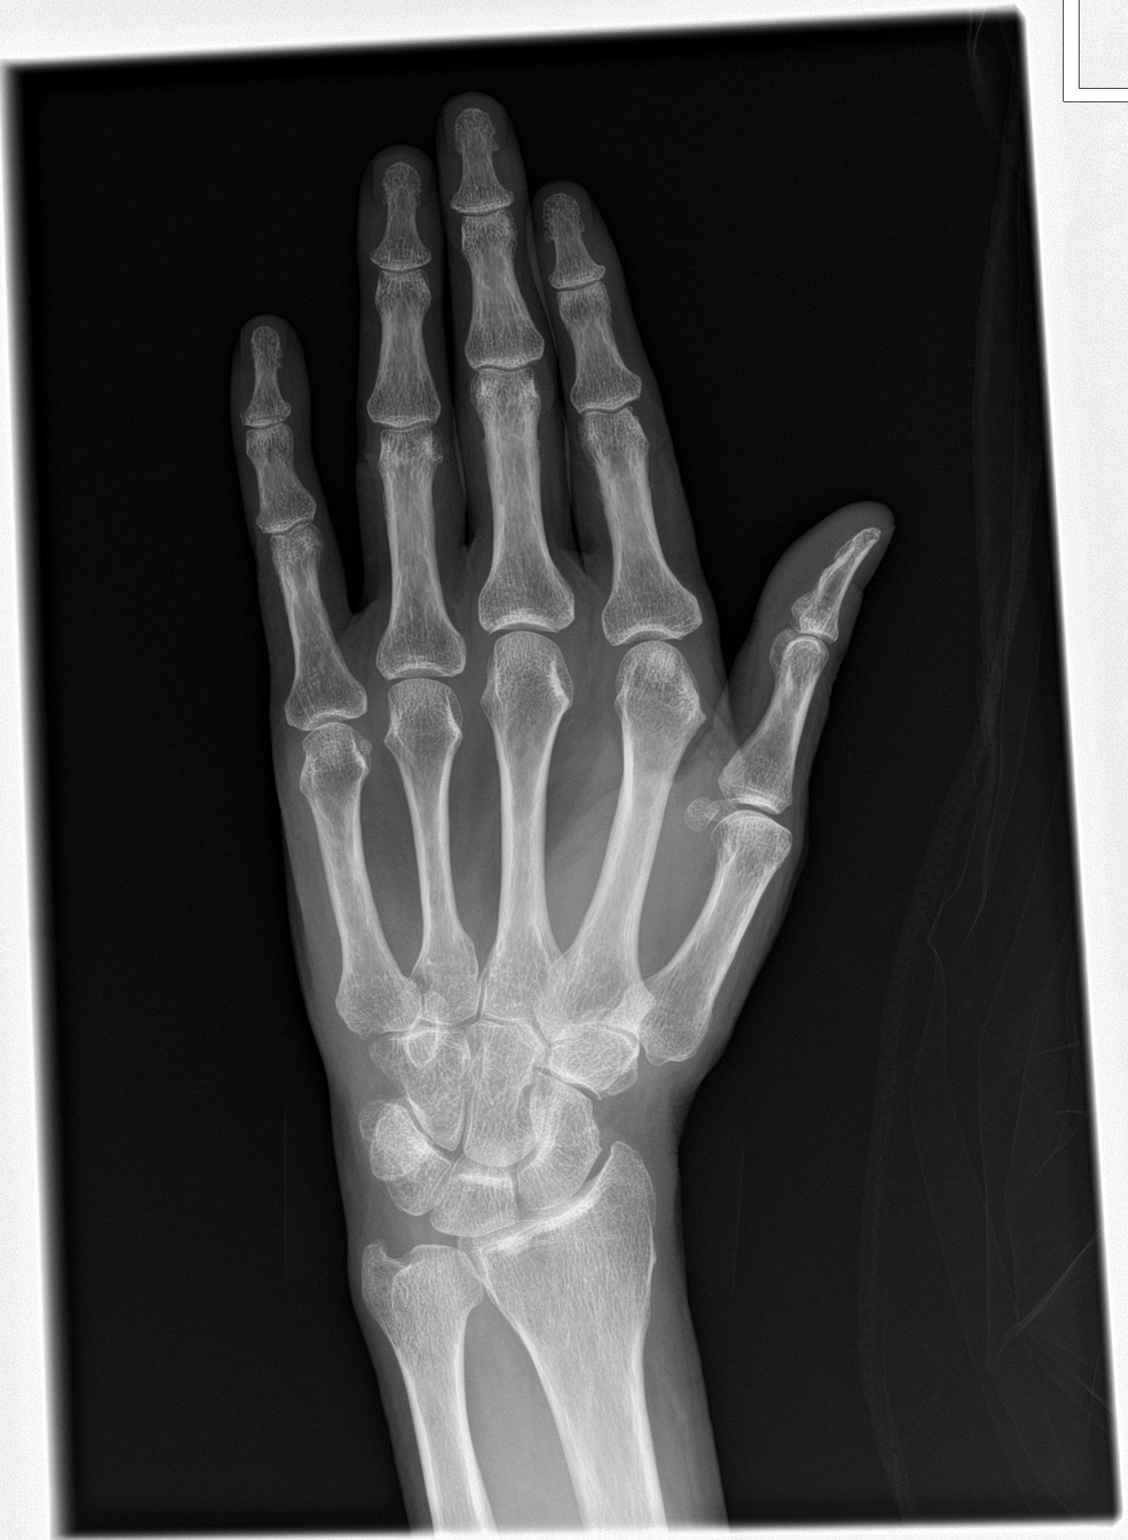

[1 of 1 positions shown; findings below may reference images not displayed]

FINDINGS: Moderate periarticular osteopenia bilaterally. No significant
degenerative changes involving the DIP or PIP joints of the fingers.

The metacarpophalangeal joints are maintained. No joint space
narrowing or erosive changes.

Minimal degenerative changes at the radiocarpal joint. The
intercarpal joint spaces are maintained. No erosive findings or
chondrocalcinosis.
IMPRESSION: Moderate periarticular osteopenia but no significant degenerative
changes or erosive findings.

## 2015-07-25 IMAGING — DX DG HAND 2V*L*
1 series · 1 of 1 positions shown · non-contrast
Comparison: None.

CLINICAL DATA: Pain and swelling.  History of rheumatoid arthritis.

EXAM:
LEFT HAND - 2 VIEW; RIGHT HAND - 2 VIEW

[hand lat]
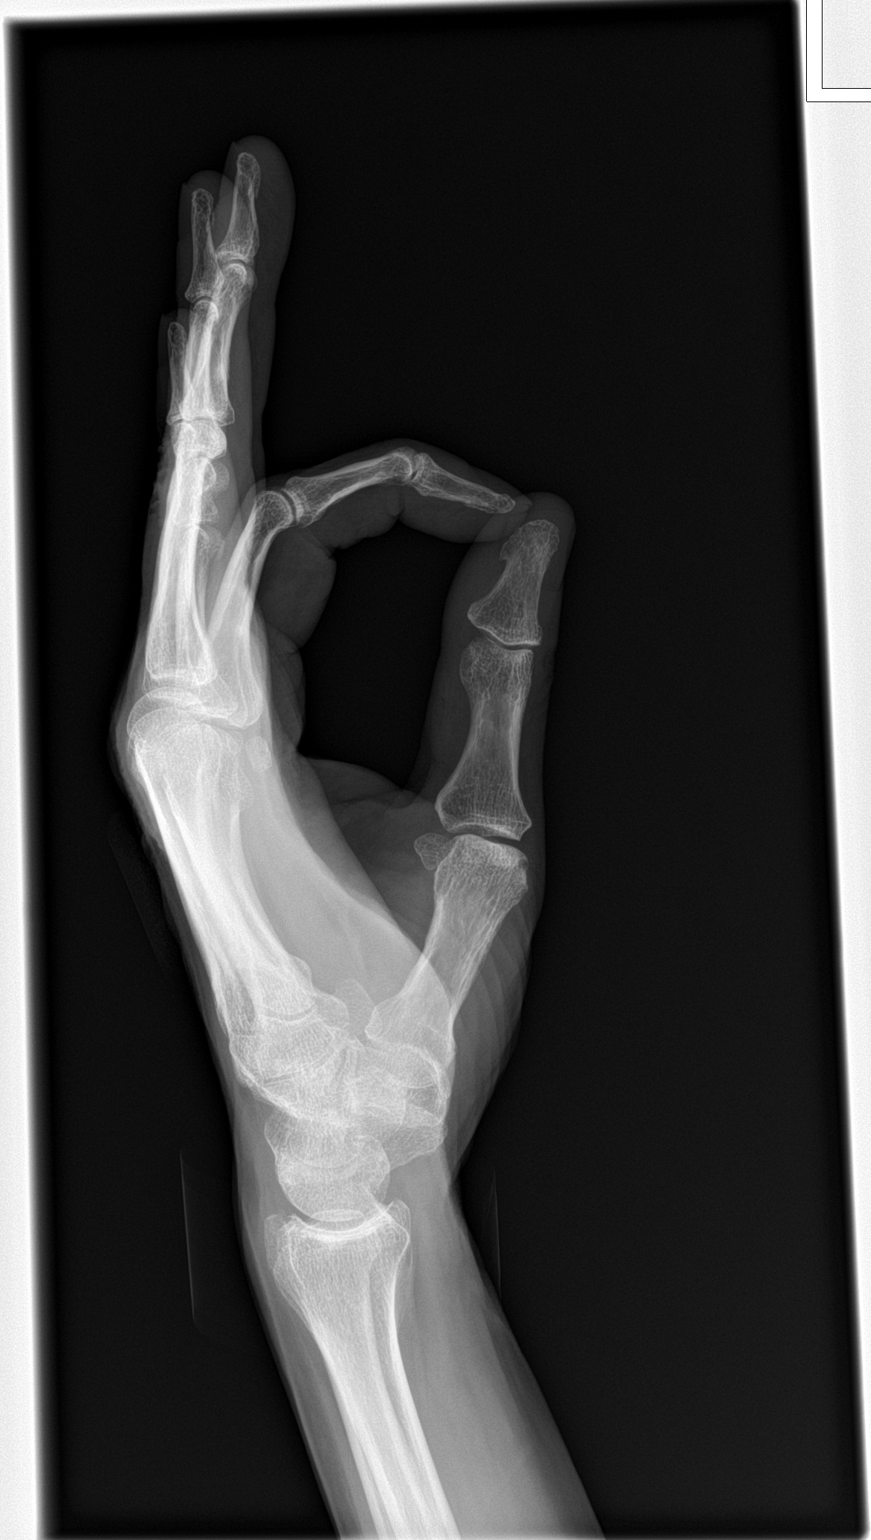

[1 of 1 positions shown; findings below may reference images not displayed]

FINDINGS: Moderate periarticular osteopenia bilaterally. No significant
degenerative changes involving the DIP or PIP joints of the fingers.

The metacarpophalangeal joints are maintained. No joint space
narrowing or erosive changes.

Minimal degenerative changes at the radiocarpal joint. The
intercarpal joint spaces are maintained. No erosive findings or
chondrocalcinosis.
IMPRESSION: Moderate periarticular osteopenia but no significant degenerative
changes or erosive findings.

## 2015-07-25 IMAGING — DX DG HAND 2V*R*
2 series · 2 of 2 positions shown · non-contrast
Comparison: None.

CLINICAL DATA: Pain and swelling.  History of rheumatoid arthritis.

EXAM:
LEFT HAND - 2 VIEW; RIGHT HAND - 2 VIEW

[hand ap]
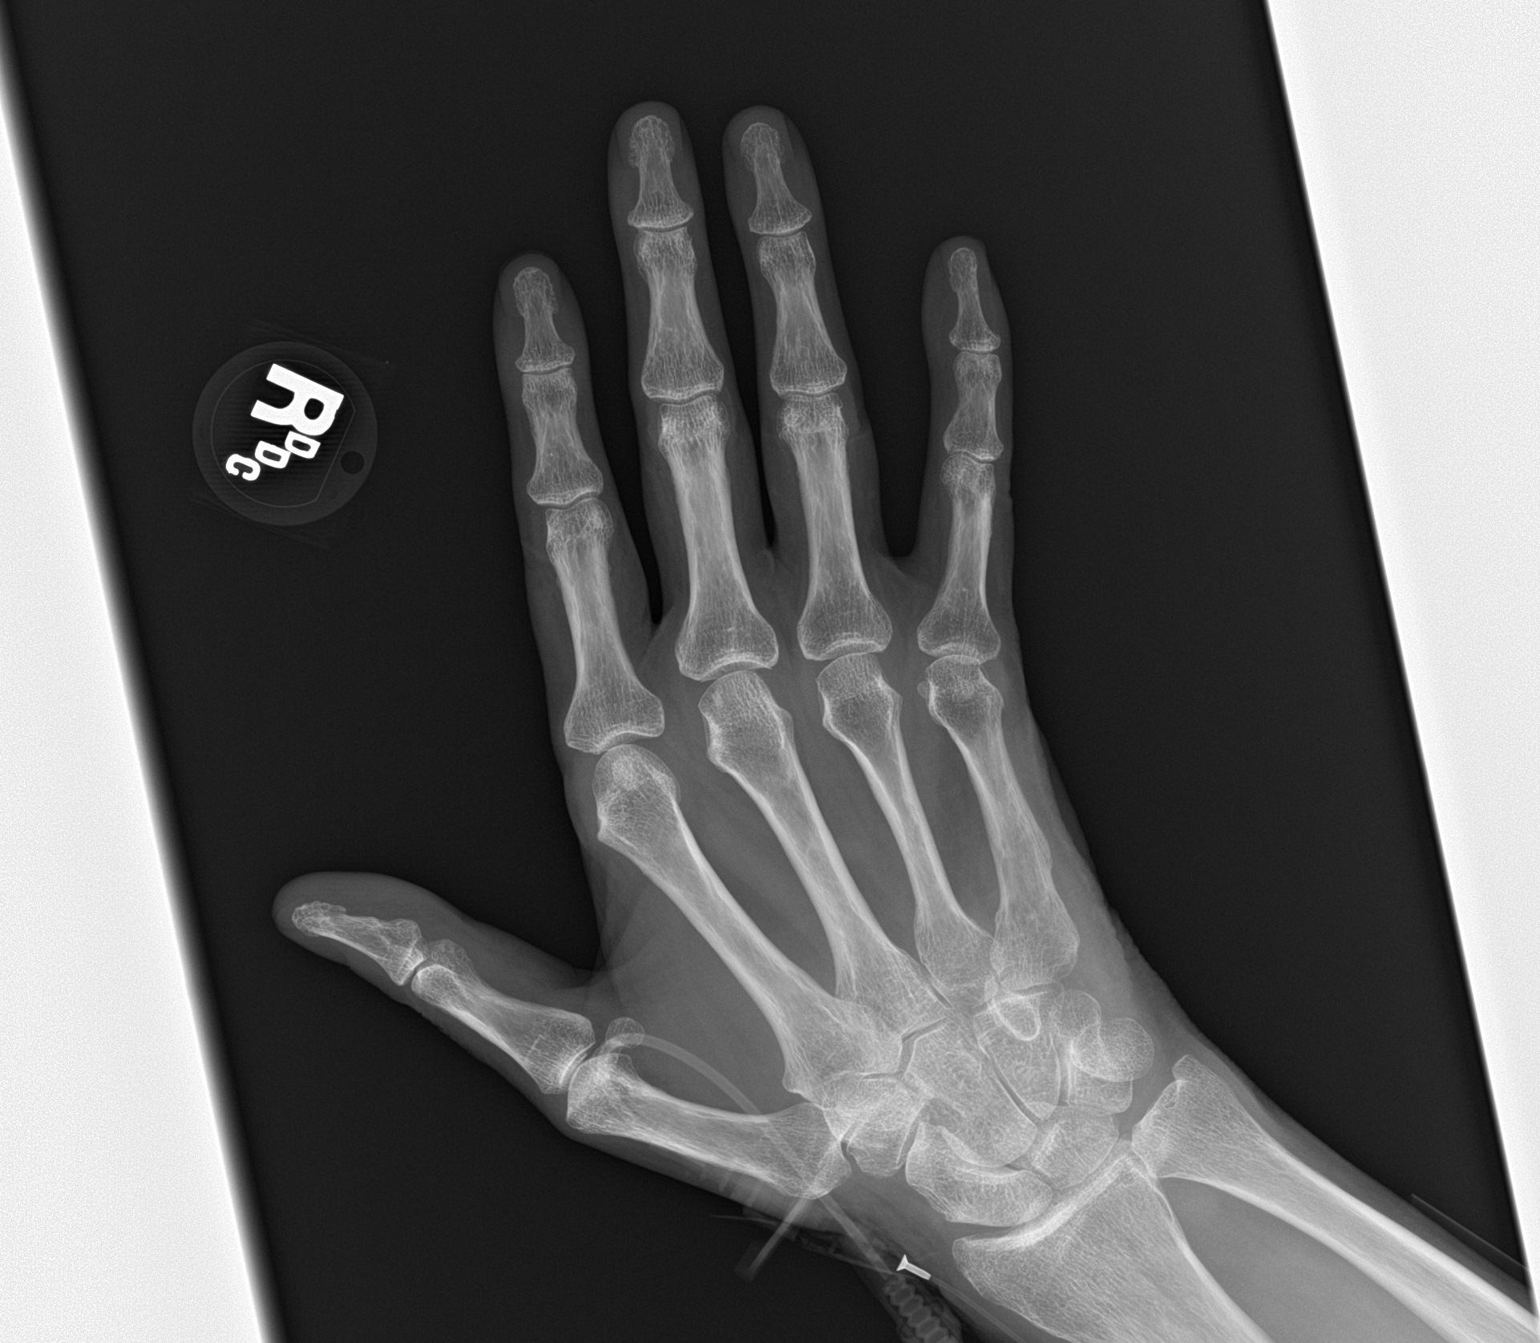

[hand lat]
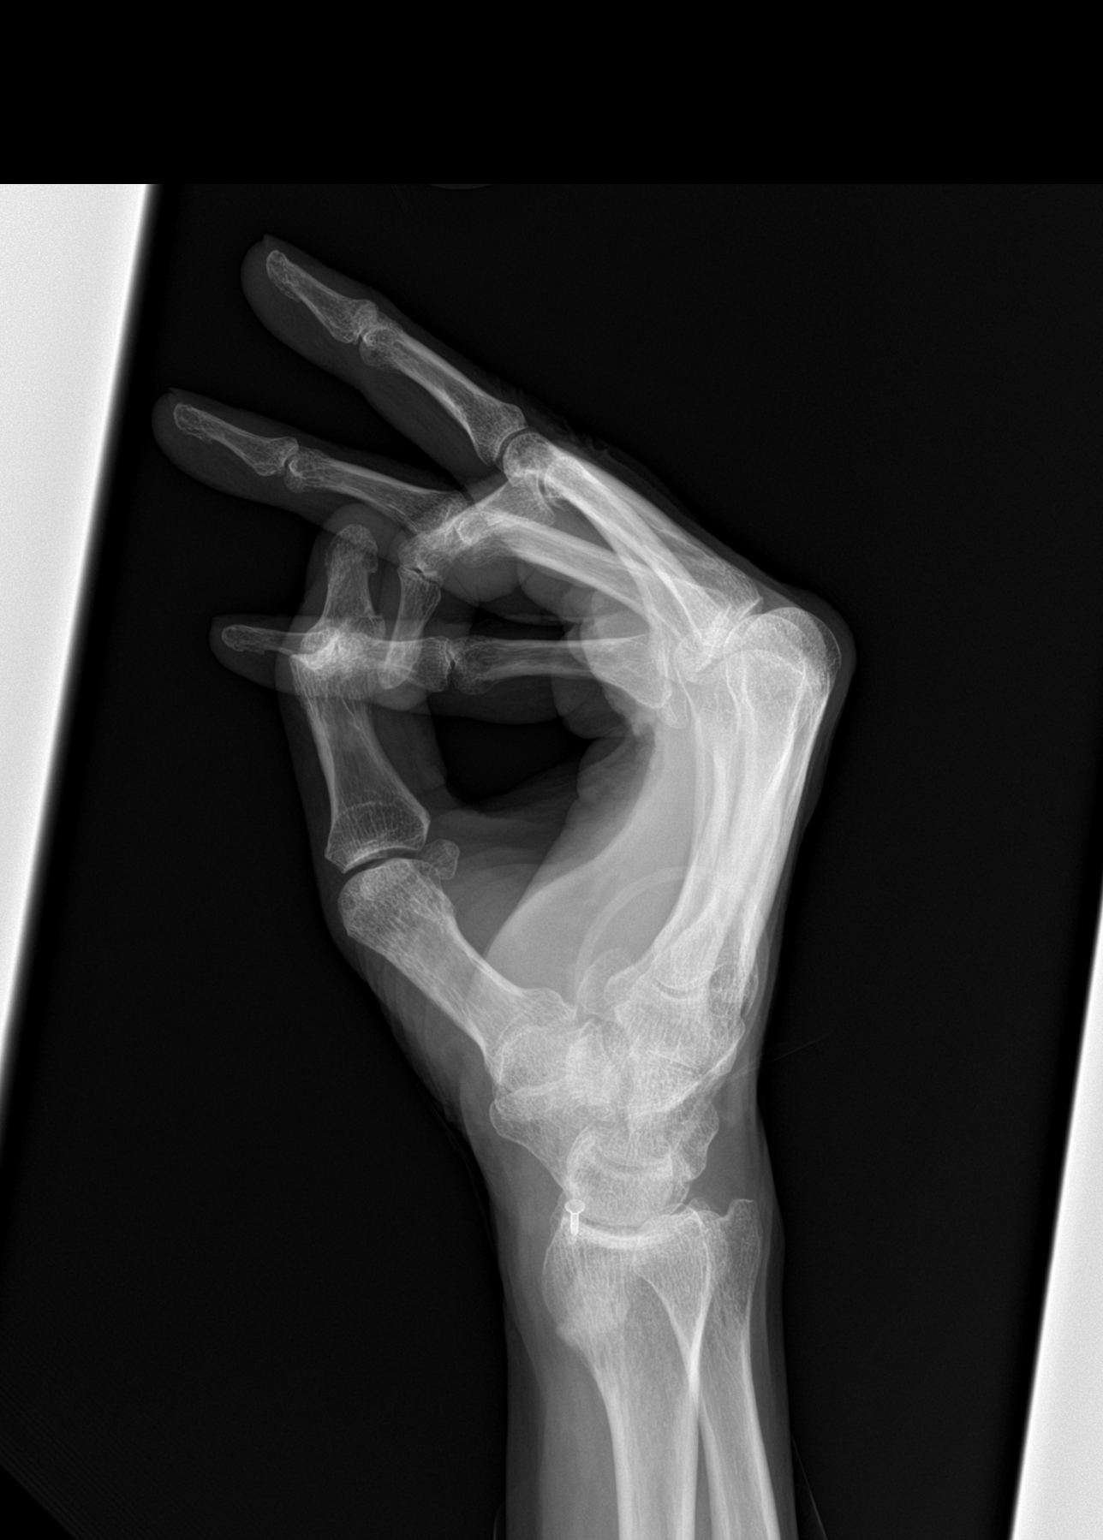

[2 of 2 positions shown; findings below may reference images not displayed]

FINDINGS: Moderate periarticular osteopenia bilaterally. No significant
degenerative changes involving the DIP or PIP joints of the fingers.

The metacarpophalangeal joints are maintained. No joint space
narrowing or erosive changes.

Minimal degenerative changes at the radiocarpal joint. The
intercarpal joint spaces are maintained. No erosive findings or
chondrocalcinosis.
IMPRESSION: Moderate periarticular osteopenia but no significant degenerative
changes or erosive findings.

## 2015-07-25 NOTE — Progress Notes (Signed)
[facility] [place] NAME: Marcus [name]    MR#:  [id number]  DATE OF BIRTH:  [birth year]  SUBJECTIVE:  CHIEF COMPLAINT:   Chief Complaint  Patient presents with  . Shortness of Breath  . Chest Pain   the patient is a 55 year old Caucasian male with past medical history significant for history of rheumatid arthritis, Felty syndrome, pancytopenia, hepatosplenomegaly, who presents to the hospital with worsening hoarseness, cough. In emergency room, he was noted to have significant pancytopenia and admitted to the hospital for further evaluation and treatment. The patient feels better today, still has hoarseness, improving, less pain in the mouth, request to advance diet to regular diet.   Review of Systems  Constitutional: Negative for fever, chills and weight loss.  HENT: Positive for sore throat. Negative for congestion.   Eyes: Negative for blurred vision and double vision.  Respiratory: Positive for cough, sputum production and stridor. Negative for shortness of breath and wheezing.   Cardiovascular: Negative for chest pain, palpitations, orthopnea, leg swelling and PND.  Gastrointestinal: Negative for nausea, vomiting, abdominal pain, diarrhea, constipation and blood in stool.  Genitourinary: Negative for dysuria, urgency, frequency and hematuria.  Musculoskeletal: Negative for falls.  Neurological: Negative for dizziness, tremors, focal weakness and headaches.  Endo/Heme/Allergies: Does not bruise/bleed easily.  Psychiatric/Behavioral: Negative for depression. The patient does not have insomnia.     VITAL SIGNS: Blood pressure 106/62, pulse 74, temperature 97.9 F (36.6 C), temperature source Oral, resp. rate 18, height 5\' 10"  (1.778 m), weight 73.301 kg (161 lb 9.6 oz), SpO2 98 %.  PHYSICAL EXAMINATION:   GENERAL:  55 y.o.-year-old patient sitting on the edge of the bed with no acute distress. Oral mucosa has erosions, especially in  the buccal surface. Pink complexion EYES: Pupils equal, round, reactive to light and accommodation. No scleral icterus. Extraocular muscles intact.  HEENT: Head atraumatic, normocephalic. Oropharynx and nasopharynx clear.  NECK:  Supple, no jugular venous distention. No thyroid enlargement, no tenderness.  LUNGS: Normal breath sounds bilaterally, no wheezing, rales,rhonchi or crepitation. No use of accessory muscles of respiration.  CARDIOVASCULAR: S1, S2 normal. No murmurs, rubs, or gallops.  ABDOMEN: Soft, nontender, nondistended. Bowel sounds present. No organomegaly or mass.  EXTREMITIES: No pedal edema, cyanosis, or clubbing.  NEUROLOGIC: Cranial nerves II through XII are intact. Muscle strength 5/5 in all extremities. Sensation intact. Gait not checked.  PSYCHIATRIC: The patient is alert and oriented x 3.  SKIN: No obvious rash, lesion, or ulcer.   ORDERS/RESULTS REVIEWED:   CBC  Recent Labs Lab 07/23/15 1514 07/24/15 0525 07/24/15 1328 07/25/15 0112 07/25/15 0606  WBC 0.4* 0.6*  --  0.6* 0.8*  HGB 6.9* 6.7* 6.8* 8.5* 8.6*  HCT 21.4* 21.0*  --  26.2* 26.4*  PLT 85* 76*  --  71* 78*  MCV 66.3* 66.7*  --  69.2* 69.1*  MCH 21.3* 21.4*  --  22.5* 22.6*  MCHC 32.2 32.1  --  32.6 32.7  RDW 22.0* 22.2*  --  23.5* 23.5*  LYMPHSABS  --   --  0.1*  --  0.4*  MONOABS  --   --  0.1  --  0.2  EOSABS  --   --  0.0  --  0.0  BASOSABS  --   --  0.0  --  0.0   ------------------------------------------------------------------------------------------------------------------  Chemistries   Recent Labs Lab 07/23/15 1514 07/24/15 0525 07/25/15 0112  NA 130* 133* 131*  K 3.9 3.9  --  CL 100* 104  --   CO2 22 25  --   GLUCOSE 137* 80  --   BUN 10 8  --   CREATININE 0.81 0.61  --   CALCIUM 7.9* 7.5*  --    ------------------------------------------------------------------------------------------------------------------ estimated creatinine clearance is 109 mL/min (by C-G formula  based on Cr of 0.61). ------------------------------------------------------------------------------------------------------------------  Recent Labs  07/23/15 1514  TSH 1.910    Cardiac Enzymes  Recent Labs Lab 07/23/15 1514  TROPONINI <0.03   ------------------------------------------------------------------------------------------------------------------ Invalid input(s): POCBNP ---------------------------------------------------------------------------------------------------------------  RADIOLOGY: Dg Chest 2 View  07/23/2015  CLINICAL DATA:  Chest pain and shortness of breath. Productive cough. EXAM: CHEST  2 VIEW COMPARISON:  06/14/2014 and 10/21/2012 FINDINGS: Heart size and pulmonary vascularity are normal. Slight peribronchial thickening. No infiltrates or effusions. Minimal scarring at the right base laterally. No bone abnormality. IMPRESSION: Slight bronchitic changes. Electronically Signed   By: Lorriane Shire M.D.   On: 07/23/2015 15:36    EKG:  Orders placed or performed during the hospital encounter of 07/23/15  . ED EKG within 10 minutes  . ED EKG within 10 minutes  . EKG 12-Lead  . EKG 12-Lead  . EKG 12-Lead  . EKG 12-Lead    ASSESSMENT AND PLAN:  Active Problems:   Pancytopenia ([facility])   Protein-calorie malnutrition, severe  #1. Pancytopenia, patient was  transfused packed red blood cells, hemoglobin level is improved to 8.6 today , transfuse as needed, neutropenic precautions  to be continued,, oncology consultation is appreciated, continue steroids, supportive therapy. Rheumatology consultation is requested, patient is a candidate for Rituxan.  #2, protein calorie malnutrition, severe, getting dietary consultation, that is being advanced to regular with neutropenic precautions #3. Acute bronchitis/laryngitis, continue antibiotic therapy, unable to obtain sputum cultures, blood cultures are negative so far, patient is afebrile #4. Hyponatremia, worsened  with IV fluid administration, questionable SIADH, getting urine and serum osmolalities , initiate fluid restriction if urine osmolarity is high #5 . Hyperglycemia, hemoglobin A1c is 5.1, no diabetes #6. Relative hypotension, off IV fluids, status post transfusion of packed red blood cells, following blood pressure readings closely, stable. #7. Generalized weakness, physical therapy consultation was requested. No follow-up was recommended Management plans discussed with the patient, family and they are in agreement.   DRUG ALLERGIES: No Known Allergies  CODE STATUS:     Code Status Orders        Start     Ordered   07/23/15 1924  Full code   Continuous     07/23/15 1923    Code Status History    Date Active Date Inactive Code Status Order ID Comments User Context   This patient has a current code status but no historical code status.      TOTAL TIME TAKING CARE OF THIS PATIENT: 30 minutes.   Discussed with the patient's family, all questions were answered Sherika Kubicki M.D on 07/25/2015 at 11:43 AM  Between 7am to 6pm - Pager - [phone]  After 6pm go to [email or web address] - password [email or web address] [facility]  [facility]  Office  [phone]  CC: Primary care physician; No PCP Per Patient

## 2015-07-25 NOTE — Consult Note (Signed)
[facility]  Date of admission:  07/23/2015  Inpatient day:  07/24/2015  Consulting physician:  Dr. 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  HPI:  The patient was diagnosed with Felty's in 06/2013 at [facility].  He presented with fevers, diffuse joint pain, neutropenia, and splenomegaly.  He underwent bone marrow which revealed a hypercellular bone marrow with mild polytypic plasmacytosis and normal karyotype which can be seen in autoimmune conditions.  CCP and RF were found to be significantly elevated.  ANA was > 1:640 but had negative dsDNA and ENA.  He was subsequently followed by Dr. Sterling Big [facility] hematologist) and Dr Daryll Brod [facility] rheumatology).  He was seen by me once during his [facility] hospitalization in 06/2014 (see consult note).  He was lost to follow-up.  The patient was last seen by Dr. Santiago Bumpers, [facility] rheumatology, on 03/12/2015.  At that time, he had no improvement in his rheumatoid arthritis or neutropenia with increasing doses of MTX. He was taking MTX 20 mg a week and prednisone 10 mg a day.  Discussions were held about initiation of Rituxan.  Leflunomide (Arava) was felt another option.  He states that he did not go back for follow-up as [place] too far away.  He would like follow-up closer to home.  He notes recent symptoms off hoarseness, mouth sores, and decreased oral intake.  He has felt like his mouth has been on fire.  Evaluation in the ER revealed a hematocrit of 21.4, hemoglobin 6.9, MCV 66.3, platelets 85,000, WBC 400.  CXR revealed slight bronchitic changes.  Patient was transfused with PRBCs.   He is on azithromycin and ceftriaxone for bronchitis.  He has been afebrile.   Past Medical History  Diagnosis Date  . 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 Pancytopenia ([place])   . Seropositive  rheumatoid arthritis ([place])   . Tobacco use disorder     History reviewed. No pertinent past surgical history.  Family History  Problem Relation Age of Onset  . CAD Brother   . COPD Brother     Social History:  reports that he has been smoking Cigarettes.  He has been smoking about 1.00 pack per day. He does not have any smokeless tobacco history on file. He reports that he uses illicit drugs (Marijuana). He reports that he does not drink alcohol.  The patient is accompanied by his brother, Jeneen Rinks.  Allergies: No Known Allergies  Medications Prior to Admission  Medication Sig Dispense Refill  . acetaminophen (TYLENOL) 500 MG tablet Take 500 mg by mouth every 6 (six) hours as needed for mild pain or headache.    . predniSONE (DELTASONE) 10 MG tablet Take 10 mg by mouth daily with breakfast.      Review of Systems: GENERAL:  Fatigue.  No fevers or sweats.  Weight loss of 24 pounds in 4 months. PERFORMANCE STATUS (ECOG):  2 HEENT:  Mouth sores.  Sore throat.  Runny nose.  Hoarse.  No visual changes. Lungs: No shortness of breath or cough.  Phlegm.  No hemoptysis. Cardiac:  No chest pain, palpitations, orthopnea, or PND. GI:  Poor appetite.  Nausea.  No vomiting, diarrhea, constipation, melena or hematochezia. GU:  No urgency, frequency, dysuria, or hematuria. Musculoskeletal:  Arthritis hands, knees, elbows. No muscle tenderness. Extremities: Arthritis.  No lower extremity swelling.  Skin:  No rashes or skin changes. Neuro:  No headache, numbness or weakness, balance or coordination issues. Endocrine:  No diabetes, thyroid issues, hot flashes or night sweats. Psych:  No mood changes, depression or anxiety. Pain:  No focal pain. Review of systems:  All other systems reviewed and found to be negative.  Physical Exam:  Blood pressure 110/67, pulse 74, temperature 97.6 F (36.4 C), temperature source Oral, resp. rate 22, height '5\' 10"'  (1.778 m), weight 161 lb 9.6 oz (73.301 kg), SpO2  99 %.  GENERAL:  Well developed, well nourished, sitting comfortably on the medical unit in no acute distress. MENTAL STATUS:  Alert and oriented to person, place and time. HEAD:  Short black hair with graying.  Marcus [name].  Normocephalic, atraumatic, face symmetric, no Cushingoid features. EYES:  Blue-gray eyes.  Pupils equal round and reactive to light and accomodation.  No conjunctivitis or scleral icterus. ENT:  Oropharynx clear without lesion.  Upper dentures.  Tongue normal. Mucous membranes moist.  RESPIRATORY:  Clear to auscultation without rales, wheezes or rhonchi. CARDIOVASCULAR:  Regular rate and rhythm without murmur, rub or gallop. ABDOMEN:  Soft, non-tender, with active bowel sounds, and no hepatomegaly.  Spleen palpable to umbilicus.  No masses. SKIN:  No rashes, ulcers or lesions. EXTREMITIES:  No edema, no skin discoloration or tenderness.  No palpable cords. LYMPH NODES: No palpable cervical, supraclavicular, axillary or inguinal adenopathy  NEUROLOGICAL: Unremarkable. PSYCH:  Appropriate.  Results for orders placed or performed during the hospital encounter of 07/23/15 (from the past 48 hour(s))  Basic metabolic panel     Status: Abnormal   Collection Time: 07/23/15  3:14 PM  Result Value Ref Range   Sodium 130 (L) 135 - 145 mmol/L   Potassium 3.9 3.5 - 5.1 mmol/L   Chloride 100 (L) 101 - 111 mmol/L   CO2 22 22 - 32 mmol/L   Glucose, Bld 137 (H) 65 - 99 mg/dL   BUN 10 6 - 20 mg/dL   Creatinine, Ser 0.81 0.61 - 1.24 mg/dL   Calcium 7.9 (L) 8.9 - 10.3 mg/dL   GFR calc non Af Amer >60 >60 mL/min   GFR calc Af Amer >60 >60 mL/min    Comment: (NOTE) The eGFR has been calculated using the CKD EPI equation. This calculation has not been validated in all clinical situations. eGFR's persistently <60 mL/min signify possible Chronic Kidney Disease.    Anion gap 8 5 - 15  CBC     Status: Abnormal   Collection Time: 07/23/15  3:14 PM  Result Value Ref Range   WBC 0.4  (LL) 3.8 - 10.6 K/uL    Comment: CRITICAL RESULT CALLED TO, READ BACK BY AND VERIFIED WITH: TERESA HUDSON AT 1545 07/23/15 MLZ     RBC 3.23 (L) 4.40 - 5.90 MIL/uL   Hemoglobin 6.9 (L) 13.0 - 18.0 g/dL   HCT 21.4 (L) 40.0 - 52.0 %   MCV 66.3 (L) 80.0 - 100.0 fL   MCH 21.3 (L) 26.0 - 34.0 pg   MCHC 32.2 32.0 - 36.0 g/dL   RDW 22.0 (H) 11.5 - 14.5 %   Platelets 85 (L) 150 - 440 K/uL  Troponin I     Status: None   Collection Time: 07/23/15  3:14 PM  Result Value Ref Range   Troponin I <0.03 <0.031 ng/mL    Comment:        NO INDICATION OF MYOCARDIAL INJURY.   TSH     Status: None  Collection Time: 07/23/15  3:14 PM  Result Value Ref Range   TSH 1.910 0.350 - 4.500 uIU/mL  Type and screen     Status: None (Preliminary result)   Collection Time: 07/23/15  4:22 PM  Result Value Ref Range   ABO/RH(D) A POS    Antibody Screen NEG    Sample Expiration 07/26/2015    Unit Number [id number]    Blood Component Type RED CELLS,LR    Unit division 00    Status of Unit ISSUED    Transfusion Status OK TO TRANSFUSE    Crossmatch Result Compatible    Unit Number [id number]    Blood Component Type RED CELLS,LR    Unit division 00    Status of Unit ISSUED,FINAL    Transfusion Status OK TO TRANSFUSE    Crossmatch Result Compatible    Unit Number [id number]    Blood Component Type RBC, LR IRR    Unit division 00    Status of Unit ISSUED    Transfusion Status OK TO TRANSFUSE    Crossmatch Result Compatible    Unit Number [id number]    Blood Component Type RBC, LR IRR    Unit division 00    Status of Unit ISSUED    Transfusion Status OK TO TRANSFUSE    Crossmatch Result Compatible   Culture, blood (Routine X 2) w Reflex to ID Panel     Status: None (Preliminary result)   Collection Time: 07/23/15  4:22 PM  Result Value Ref Range   Specimen Description BLOOD LEFT ASSIST CONTROL    Special Requests      BOTTLES DRAWN AEROBIC AND ANAEROBIC 8CC AERO [place] ANA   Culture NO  GROWTH < 24 HOURS    Report Status PENDING   Prepare RBC     Status: None   Collection Time: 07/23/15  4:22 PM  Result Value Ref Range   Order Confirmation ORDER PROCESSED BY BLOOD BANK   Culture, blood (Routine X 2) w Reflex to ID Panel     Status: None (Preliminary result)   Collection Time: 07/23/15  4:23 PM  Result Value Ref Range   Specimen Description BLOOD LEFT WRIST    Special Requests      BOTTLES DRAWN AEROBIC AND ANAEROBIC 8CC AERO 6CC ANA   Culture NO GROWTH < 24 HOURS    Report Status PENDING   ABO/Rh     Status: None   Collection Time: 07/23/15  4:23 PM  Result Value Ref Range   ABO/RH(D) A POS   Basic metabolic panel     Status: Abnormal   Collection Time: 07/24/15  5:25 AM  Result Value Ref Range   Sodium 133 (L) 135 - 145 mmol/L   Potassium 3.9 3.5 - 5.1 mmol/L   Chloride 104 101 - 111 mmol/L   CO2 25 22 - 32 mmol/L   Glucose, Bld 80 65 - 99 mg/dL   BUN 8 6 - 20 mg/dL   Creatinine, Ser 0.61 0.61 - 1.24 mg/dL   Calcium 7.5 (L) 8.9 - 10.3 mg/dL   GFR calc non Af Amer >60 >60 mL/min   GFR calc Af Amer >60 >60 mL/min    Comment: (NOTE) The eGFR has been calculated using the CKD EPI equation. This calculation has not been validated in all clinical situations. eGFR's persistently <60 mL/min signify possible Chronic Kidney Disease.    Anion gap 4 (L) 5 - 15  CBC     Status: Abnormal   Collection  Time: 07/24/15  5:25 AM  Result Value Ref Range   WBC 0.6 (LL) 3.8 - 10.6 K/uL    Comment: RESULT REPEATED AND VERIFIED CRITICAL VALUE NOTED.  VALUE IS CONSISTENT WITH PREVIOUSLY REPORTED AND CALLED VALUE.    RBC 3.15 (L) 4.40 - 5.90 MIL/uL   Hemoglobin 6.7 (L) 13.0 - 18.0 g/dL   HCT 21.0 (L) 40.0 - 52.0 %   MCV 66.7 (L) 80.0 - 100.0 fL   MCH 21.4 (L) 26.0 - 34.0 pg   MCHC 32.1 32.0 - 36.0 g/dL   RDW 22.2 (H) 11.5 - 14.5 %   Platelets 76 (L) 150 - 440 K/uL  Hemoglobin     Status: Abnormal   Collection Time: 07/24/15  1:28 PM  Result Value Ref Range    Hemoglobin 6.8 (L) 13.0 - 18.0 g/dL  Hemoglobin A1c     Status: None   Collection Time: 07/24/15  1:28 PM  Result Value Ref Range   Hgb A1c MFr Bld  4.0 - 6.0 %    UNABLE TO REPORT A1C DUE TO UNKNOWN INTERFERING FACTOR CAUSING THE ANALYTICAL RANGE TO BE OUTSIDE OF ANALYZER RANGE SAMPLE SENT TO [facility] FOR AN ALTERNATIVE METHOD   Mean Plasma Glucose      UNABLE TO REPORT A1C DUE TO UNKNOWN INTERFERING FACTOR CAUSING THE ANALYTICAL RANGE TO BE OUTSIDE OF ANALYZER RANGE SAMPLE SENT TO [facility] FOR AN ALTERNATIVE METHOD   Differential     Status: Abnormal   Collection Time: 07/24/15  1:28 PM  Result Value Ref Range   Neutro Abs 0.2 (L) 1.7 - 7.7 K/uL   Lymphs Abs 0.1 (L) 0.7 - 4.0 K/uL   Monocytes Absolute 0.1 0.1 - 1.0 K/uL   Eosinophils Absolute 0.0 0.0 - 0.7 K/uL   Basophils Absolute 0.0 0.0 - 0.1 K/uL   Neutrophils Relative % 50 %   Lymphocytes Relative 28 %   Monocytes Relative 14 %   Eosinophils Relative 1 %   Basophils Relative 0 %   Band Neutrophils 5 %   Metamyelocytes Relative 2 %   Myelocytes 0 %   Promyelocytes Absolute 0 %   Blasts 0 %   nRBC 2 (H) 0 /100 WBC   Other 0 %  Prepare RBC     Status: None   Collection Time: 07/24/15  3:21 PM  Result Value Ref Range   Order Confirmation ORDER PROCESSED BY BLOOD BANK   Lactate dehydrogenase     Status: Abnormal   Collection Time: 07/24/15  4:52 PM  Result Value Ref Range   LDH 196 (H) 98 - 192 U/L  CBC     Status: Abnormal   Collection Time: 07/25/15  1:12 AM  Result Value Ref Range   WBC 0.6 (LL) 3.8 - 10.6 K/uL    Comment: CRITICAL VALUE NOTED.  VALUE IS CONSISTENT WITH PREVIOUSLY REPORTED AND CALLED VALUE. RESULT REPEATED AND VERIFIED    RBC 3.78 (L) 4.40 - 5.90 MIL/uL   Hemoglobin 8.5 (L) 13.0 - 18.0 g/dL   HCT 26.2 (L) 40.0 - 52.0 %   MCV 69.2 (L) 80.0 - 100.0 fL   MCH 22.5 (L) 26.0 - 34.0 pg   MCHC 32.6 32.0 - 36.0 g/dL   RDW 23.5 (H) 11.5 - 14.5 %   Platelets 71 (L) 150 - 440 K/uL  Sodium     Status:  Abnormal   Collection Time: 07/25/15  1:12 AM  Result Value Ref Range   Sodium 131 (L) 135 - 145  mmol/L   Dg Chest 2 View  07/23/2015  CLINICAL DATA:  Chest pain and shortness of breath. Productive cough. EXAM: CHEST  2 VIEW COMPARISON:  06/14/2014 and 10/21/2012 FINDINGS: Heart size and pulmonary vascularity are normal. Slight peribronchial thickening. No infiltrates or effusions. Minimal scarring at the right base laterally. No bone abnormality. IMPRESSION: Slight bronchitic changes. Electronically Signed   By: Lorriane Shire M.D.   On: 07/23/2015 15:36    Assessment:  The patient is a 55 y.o. gentleman with 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rheumatoid arthritis, splenomegaly, neutropenia) admitted with bronchitis, mouth sores, and decreased oral intake.  He has lost 24 pounds in the past 4 months.  He has been unresponsive to methotrexate.  He is a candidate for outpatient Rituxan.  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  He has a microcytic anemia likely due to poor nutrition.  He denies any melena or hematochezia.  Plan:   1.  Labs:  B12, folate, ferritin, iron studies.   2.  In anticipation of outpatient Rituxan:  hepatitis B surface antigen, hepatitis B core antibody total, hepatitis C. 3.  Guaiac all stools. 4.  Neutropenic precautions.   5.  Consult rheumatology.  Patient needs ongoing care locally. 6.  Continue prednisone 10 mg a day. 7.  Nutrition consult. 8.  Continue antibiotics.  Blood cultures q 24 hours prn temp > 100.4.   9.  No indication for GCSF unless clinically ill/septic.  Thank you for allowing me to participate in TYLIN FORCE 's care.  I will follow him closely with you while hospitalized and after discharge in the outpatient department.   Lequita Asal, MD  07/24/2015

## 2015-07-25 NOTE — Progress Notes (Signed)
Pharmacy Antibiotic Note  Marcus [name] is a 55 y.o. male admitted on 07/23/2015 with bronchitis/laryngitis and pancytopenia.  Pharmacy has been consulted for ceftriaxone dosing.  Plan: Will continue ceftriaxone 1gm IV Q24H and azithromycin 500mg  IV Q24H.  Patient neutropenic now with ANC: 200. Would have low threshold to recommend changing from ceftriaxone to cefepime for broader coverage if patient begins spiking fevers.  Height: 5\' 10"  (177.8 cm) Weight: 161 lb 9.6 oz (73.301 kg) IBW/kg (Calculated) : 73  Temp (24hrs), Avg:98.1 F (36.7 C), Min:97.6 F (36.4 C), Max:99.3 F (37.4 C)   Recent Labs Lab 07/23/15 1514 07/24/15 0525 07/25/15 0112 07/25/15 0606  WBC 0.4* 0.6* 0.6* 0.8*  CREATININE 0.81 0.61  --   --     Estimated Creatinine Clearance: 109 mL/min (by C-G formula based on Cr of 0.61).    No Known Allergies  Antimicrobials this admission: ceftriaxone 4/17 >>  azithromycin 4/17 >>   Microbiology results: 4/17 BCx: NGTD x 2  Thank you for allowing pharmacy to be a part of this patient's care.  Shalva Rozycki C 07/25/2015 9:48 AM

## 2015-07-25 NOTE — Care Management (Signed)
[facility] [place] is where he get his prednisone. He lives in [place]. Tried medication management but they denied him. He lives with his mother and he refused (and still refuses) to provide financial information for household. History of going to [facility] for medical treatment- cancelled him last month. He states that he has tried to go to [facility] but "they were not taking new patients". Drives. Walks independently. I explained that CM could not assist him with medications if he cannot provide information required. He states he understands and states he "will get his medications like he always has".

## 2015-07-25 NOTE — Consult Note (Addendum)
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 Describes an illness in 2014 where he developed sores on his body and fever. Was found to have low blood count. Large spleen. Was offered splenectomy but declined. As hospitalized at [facility] in 2015. Had neutropenia as well as a component of cytopenia. Evaluation by hematology showed bone marrow shows hyper plastic cell lines. Not to be related to immune disease. Had positive rheumatoid factor and anti-CCP antibodies. Was labeled as Felty's syndrome. He had not had prior diagnosis of rheumatoid arthritis. Was placed on prednisone and methotrexate. Had hematology and rheumatology follow-up. Repeat evaluation in 2016. Cytopenia. CT scan showed splenomegaly no ascites or or hepatomegaly. Has lost weight. History of hoarseness. Evaluated at [facility]. Had biopsy with lymphocytic infiltrate. Felt to be inflammation. Recent hospitalization for infection. Antibiotics. [facility] rheumatology back and offered rituximab. Recurrent hoarseness with oral ulcers and was readmitted. Hemoglobin down to 6. Transfused. Platelets 85,000. Neutrophils 400. Now on antibiotics. Joints bother him across the MCPs. He's lost 24 pounds in the last 2-3 months. Prior positive AMA but negative DNA. Recently low sodium. Recently abdominal swelling. Has had his fingers to change color in the cold from the MCPs out. Toes will also do it. Has not had any soreness over the digits. Occasional shortness of breath. This hospitalization no sinus sedimentation rate as 128. White count now 0.8 thousand. Hemoglobin up to 8.2. Platelets at 120,000. Low sodium at 131. Low calcium at 7. No current liver functions or albumin  PMH: Pancytopenia. Rheumatoid Arthritis.  SURGICAL HISTORY: No abdominal surgery  Family History: No family history of rheumatoid or connective tissue disease  Social History: Denies  alcohol. Positive marijuana. Positive cigarettes.  Allergies: No Known Allergies  Medications:  Scheduled: . antiseptic oral rinse  7 mL Mouth Rinse q12n4p  . azithromycin  500 mg Intravenous Q24H  . cefTRIAXone (ROCEPHIN)  IV  1 g Intravenous Q24H  . chlorhexidine  15 mL Mouth Rinse BID  . feeding supplement (ENSURE ENLIVE)  237 mL Oral BID BM  . nicotine  21 mg Transdermal Daily  . predniSONE  30 mg Oral Q breakfast  . sodium chloride flush  3 mL Intravenous Q12H        ROS: No chest pain. No abdominal pain. No alopecia. Positive Raynaud's   PHYSICAL EXAM: Blood pressure 107/65, pulse 85, temperature 98.2 F (36.8 C), temperature source Oral, resp. rate 18, height 5\' 10"  (1.778 m), weight 73.301 kg (161 lb 9.6 oz), SpO2 98 %. Ill-appearing male.'s of his left face. Pale scleral oropharynx with numerous ulcers. No cervical adenopathy. Neck i supple. Clear chest.No significan murmur. Protuberant abdomen. Question fluid wave. Spleen palpates down 3-4 fingerbreadths. No significant edema Muscular atrophy diffusely biceps triceps thighs. Good range of motion neck and shoulder. Left elbow nodule. Mild thickness cross MCPs but no fixed deformity. Wrists without synovitis. Knees without synovitis. Ankles move well. Thenar atrophy. 2+ reflexes. Mild decreased grip as well as decreased plantar and flexor power  Assessment: Several year history of neutropenia now with significant anemia and thrombocytopenia. In the setting of positive rheumatoid factor, anti-CCP antibodies, positive ANA, and Raynaud's. 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 Current exam raises the question of whether he could have ascites ,raising the question of whether he has liver disease Significant weight loss, and  muscle atrophy Hyponatremia Oral ulcers Hoarseness, status post biopsy  Recommendations: Recommend restaging his illness before given him rituximab or  performing a spleenectomy. Hand films, FANA with ENA's  anti-DNA, urine to look for proteinuria, liver functions Consider repeat abdominal CT Scan rule out hepatomegaly and ascites   Marcus [name] 07/25/2015, 2:23 PM

## 2015-07-25 NOTE — Evaluation (Signed)
Physical Therapy Evaluation Patient Details Name: [name] MRN: [id number] DOB: Oct 09, 1960 Today's Date: 07/25/2015   History of Present Illness  [name]  is a 55 y.o. male with a known history of sero positive rheumatoid arthritis, Felty's syndrome with hand cytopenia, hepatosplenomegaly presents to the hospital secondary to worsening hoarseness of his voice and also cough symptoms. Patient was admitted here March 2016 and was discharged on increased dose of prednisone for his worsening pancytopenia at the time. He hasn't followed up with hematologist for a long time. He used to follow-up with [facility] hematology Dr. Sterling Big before, seen by Dr. Mike Gip during last admission- since then hasn't followed with. Seen his Rheumatologist last in Dec 2016 and was on methotrexate, folic acid and prednisone then. Plans were to start rituxan instead of methotrexate, however patient didn't follow up further. His last labs indicate, his wbc is around 0.7k, platelets 90k and hb in the range of 10. For the past week he has been having hoarseness of his voice with worsening cough and upper respiratory tract infection symptoms. He felt like his tongue was on fire, cracks in his mouth and also mouth ulcerations that causing him to decrease his oral intake. His cough and his mouth symptoms were getting worse and so presented to the emergency room. Noted to be pancytopenic with drop in hemoglobin to 6.9. Also CXR with bronchitis findings. No falls in the last 12 months.   Clinical Impression  Pt demonstrates independence with bed mobility and transfers and supervision only for ambulation without assistive device. His mobility is currently baseline. Five time sit to stand is 10.5 seconds which is WNL for age/gender. Gait is WNL with appropriate speed for full community ambulation. Single leg balance is >10 seconds and negative Rhomberg. Pt encouraged to join community based exercise program pending clearance from MD. No  further PT needs at this time as pt is at baseline. Will complete order. Please enter new order if status or needs change.     Follow Up Recommendations No PT follow up    Equipment Recommendations  None recommended by PT    Recommendations for Other Services       Precautions / Restrictions Precautions Precautions: None Restrictions Weight Bearing Restrictions: No      Mobility  Bed Mobility Overal bed mobility: Independent             General bed mobility comments: Good speed and sequencing during both phases of transfer  Transfers Overall transfer level: Independent Equipment used: None             General transfer comment: Good speed and sequencing without signs for instability. 5TSTS: 10.5 seconds, WNL for age/gender norms  Ambulation/Gait Ambulation/Gait assistance: Supervision Ambulation Distance (Feet): 220 Feet Assistive device: None Gait Pattern/deviations: WFL(Within Functional Limits) Gait velocity: WNL Gait velocity interpretation: >2.62 ft/sec, indicative of independent community ambulator General Gait Details: Pt demonstrates with normal gait sequencing. Speed is [facility] for full community ambulation. Modified DGI: 12/12. No signs for imbalance with gait. Vitals remain WNL and pt denies DOE.  Stairs            Wheelchair Mobility    Modified Rankin (Stroke Patients Only)       Balance Overall balance assessment: Independent Sitting-balance support: No upper extremity supported Sitting balance-Leahy Scale: Normal     Standing balance support: No upper extremity supported Standing balance-Leahy Scale: Normal Standing balance comment: Single leg balance >10 seconds, negative Rhomberg, modified DGI: 12/12  Pertinent Vitals/Pain Pain Assessment: No/denies pain    Home Living Family/patient expects to be discharged to:: Private residence Living Arrangements: Parent Available Help at Discharge:  Family Type of Home: Mobile home Home Access: Stairs to enter Entrance Stairs-Rails: Can reach both Entrance Stairs-Number of Steps: 4 Home Layout: One level Home Equipment: Other (comment) (No equipment at home)      Prior Function Level of Independence: Independent         Comments: Full community ambulator without assistive device. Drives. Pt fully independent with ADLs/IADLs but mother helps out with household responsibilities     Hand Dominance   Dominant Hand: Left    Extremity/Trunk Assessment   Upper Extremity Assessment: Overall WFL for tasks assessed           Lower Extremity Assessment: Overall WFL for tasks assessed         Communication   Communication: No difficulties  Cognition Arousal/Alertness: Awake/alert Behavior During Therapy: WFL for tasks assessed/performed Overall Cognitive Status: Within Functional Limits for tasks assessed                      General Comments      Exercises        Assessment/Plan    PT Assessment Patent does not need any further PT services  PT Diagnosis     PT Problem List    PT Treatment Interventions     PT Goals (Current goals can be found in the Care Plan section) Acute Rehab PT Goals Patient Stated Goal: Return home PT Goal Formulation: With patient    Frequency     Barriers to discharge        Co-evaluation               End of Session Equipment Utilized During Treatment: Gait belt Activity Tolerance: Patient tolerated treatment well Patient left: in bed;with call bell/phone within reach           Time: 0842-0900 PT Time Calculation (min) (ACUTE ONLY): 18 min   Charges:   PT Evaluation $PT Eval Low Complexity: 1 Procedure     PT G Codes:       Jason D Huprich PT, DPT   Huprich,Jason 07/25/2015, 10:01 AM

## 2015-07-26 ENCOUNTER — Inpatient Hospital Stay: Payer: Self-pay

## 2015-07-26 LAB — SODIUM: Sodium: 136 mmol/L (ref 135–145)

## 2015-07-26 LAB — URINALYSIS COMPLETE WITH MICROSCOPIC (ARMC ONLY)
Bilirubin Urine: NEGATIVE
Glucose, UA: NEGATIVE mg/dL
Hgb urine dipstick: NEGATIVE
Ketones, ur: NEGATIVE mg/dL
Leukocytes, UA: NEGATIVE
Nitrite: NEGATIVE
Protein, ur: NEGATIVE mg/dL
RBC / HPF: NONE SEEN RBC/hpf (ref 0–5)
SQUAMOUS EPITHELIAL / LPF: NONE SEEN
Specific Gravity, Urine: 1.005 (ref 1.005–1.030)
pH: 6 (ref 5.0–8.0)

## 2015-07-26 LAB — POTASSIUM: POTASSIUM: 4.4 mmol/L (ref 3.5–5.1)

## 2015-07-26 LAB — CBC WITH DIFFERENTIAL/PLATELET
Basophils Absolute: 0 10*3/uL (ref 0–0.1)
Basophils Relative: 1 %
Eosinophils Absolute: 0 10*3/uL (ref 0–0.7)
Eosinophils Relative: 2 %
HCT: 25.8 % — ABNORMAL LOW (ref 40.0–52.0)
Hemoglobin: 8.3 g/dL — ABNORMAL LOW (ref 13.0–18.0)
Lymphocytes Relative: 65 %
Lymphs Abs: 0.3 10*3/uL — ABNORMAL LOW (ref 1.0–3.6)
MCH: 22.4 pg — ABNORMAL LOW (ref 26.0–34.0)
MCHC: 32.1 g/dL (ref 32.0–36.0)
MCV: 69.8 fL — ABNORMAL LOW (ref 80.0–100.0)
Monocytes Absolute: 0.1 10*3/uL — ABNORMAL LOW (ref 0.2–1.0)
Monocytes Relative: 21 %
Neutro Abs: 0.1 10*3/uL — ABNORMAL LOW (ref 1.4–6.5)
Neutrophils Relative %: 11 %
Platelets: 75 10*3/uL — ABNORMAL LOW (ref 150–440)
RBC: 3.69 MIL/uL — ABNORMAL LOW (ref 4.40–5.90)
RDW: 23.3 % — ABNORMAL HIGH (ref 11.5–14.5)
WBC: 0.5 10*3/uL — CL (ref 3.8–10.6)

## 2015-07-26 LAB — ANA W/REFLEX IF POSITIVE: ANA: NEGATIVE

## 2015-07-26 LAB — HEPATITIS B SURFACE ANTIGEN: Hepatitis B Surface Ag: NEGATIVE

## 2015-07-26 LAB — HEPATITIS B CORE ANTIBODY, TOTAL: Hep B Core Total Ab: NEGATIVE

## 2015-07-26 LAB — HEPATITIS C ANTIBODY: HCV Ab: <0.1 s/co ratio (ref 0.0–0.9)

## 2015-07-26 IMAGING — CT CT ABD-PELV W/ CM
1 of 3 series · 13 of 32 positions shown, 18 images · IV contrast (iopamidol)
Comparison: [DATE]

CLINICAL DATA: 55-year-old Caucasian male with past medical history
significant for history of rheumatid arthritis, Felty syndrome,
pancytopenia, hepatosplenomegaly, who presents to the hospital with
worsening hoarseness, cough. In emergency room, he was noted to have
significant pancytopenia and admitted to the hospital for further
evaluation and treatment. No surgical hx

EXAM:
CT ABDOMEN AND PELVIS WITH CONTRAST
TECHNIQUE: Multidetector CT imaging of the abdomen and pelvis was performed
using the standard protocol following bolus administration of
intravenous contrast.
CONTRAST:  100mL [BU] IOPAMIDOL ([BU]) INJECTION 61%

[Series 2: routine abd pel with · axial · 0.78mm/px · z∈[-494,-79]mm · 13 of 93 slices shown, 18 images]
[im 5/93  soft-tissue]
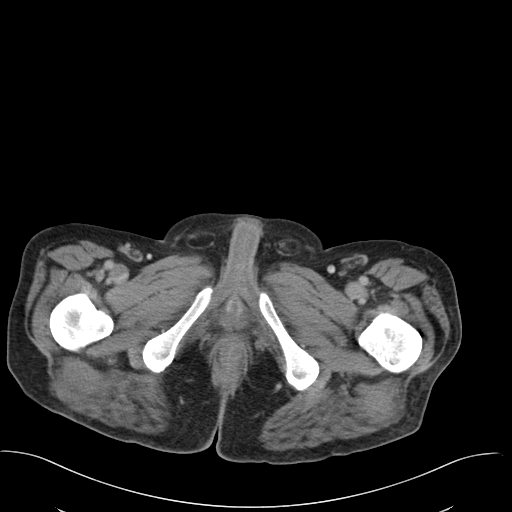
[im 5/93  bone]
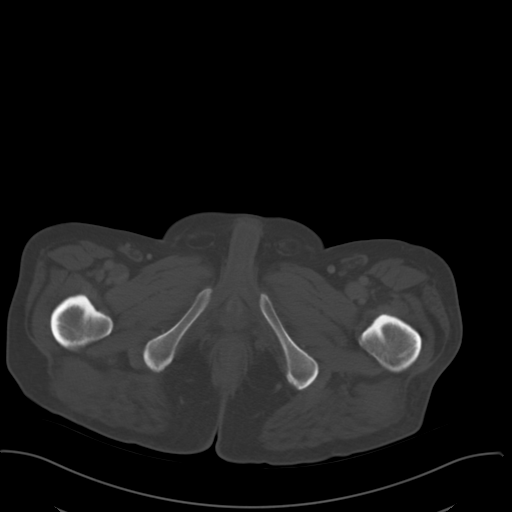
[im 15/93  soft-tissue]
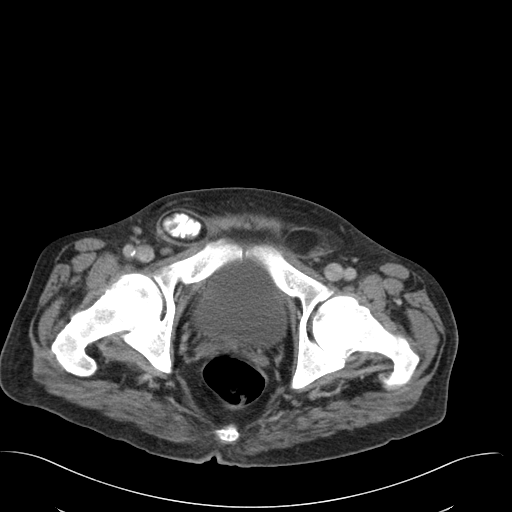
[im 20/93  soft-tissue]
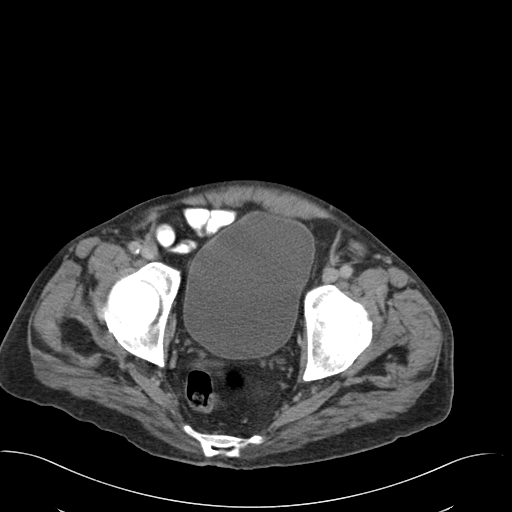
[im 30/93  soft-tissue]
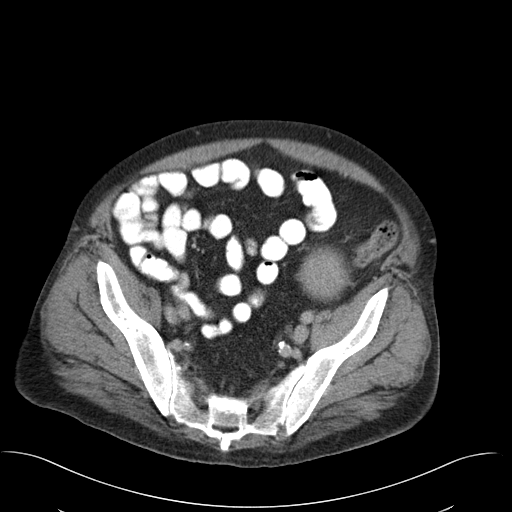
[im 34/93  soft-tissue]
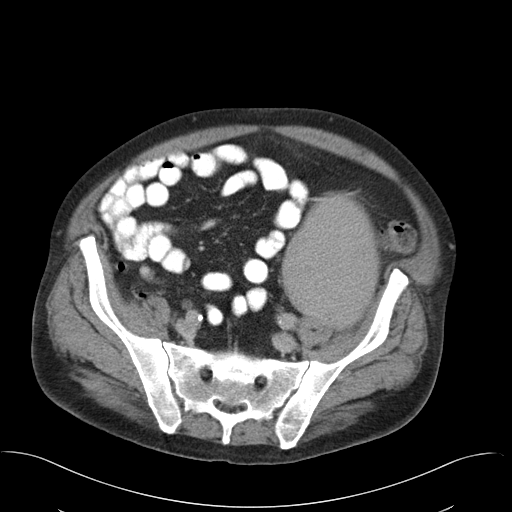
[im 44/93  soft-tissue]
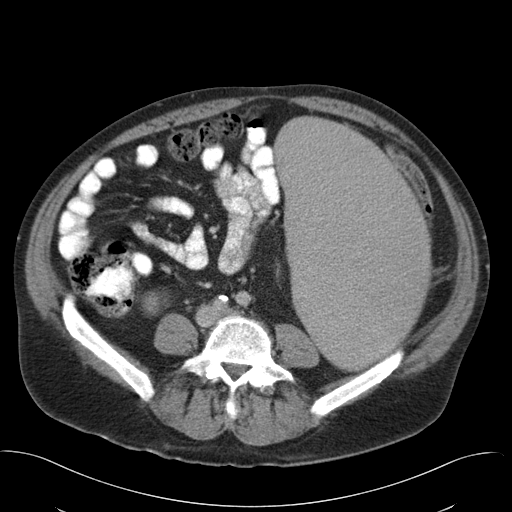
[im 49/93  soft-tissue]
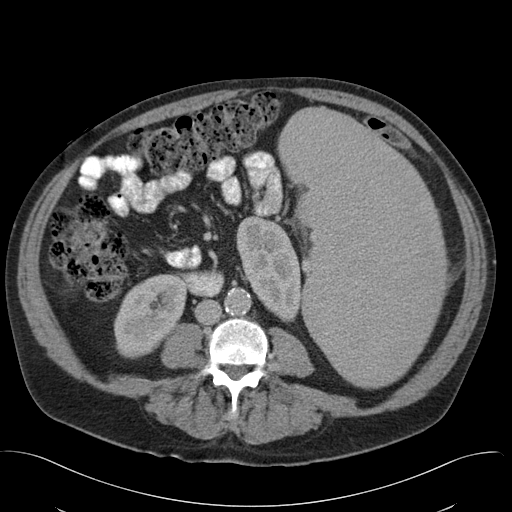
[im 59/93  soft-tissue]
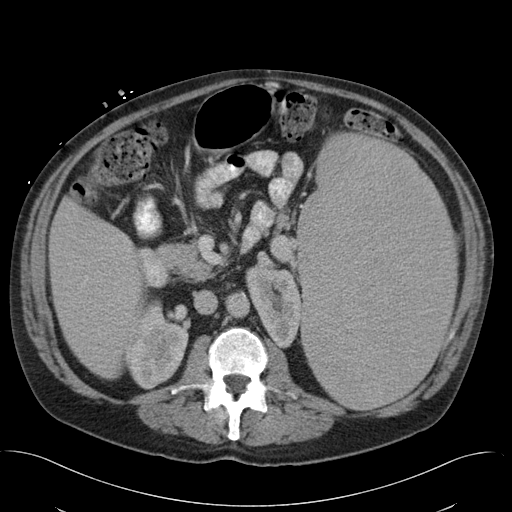
[im 63/93  soft-tissue]
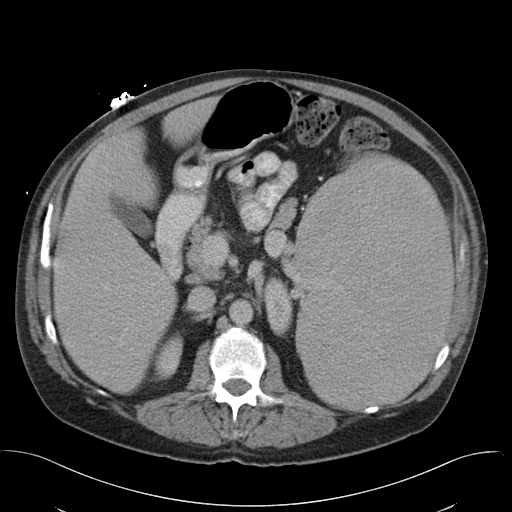
[im 63/93  bone]
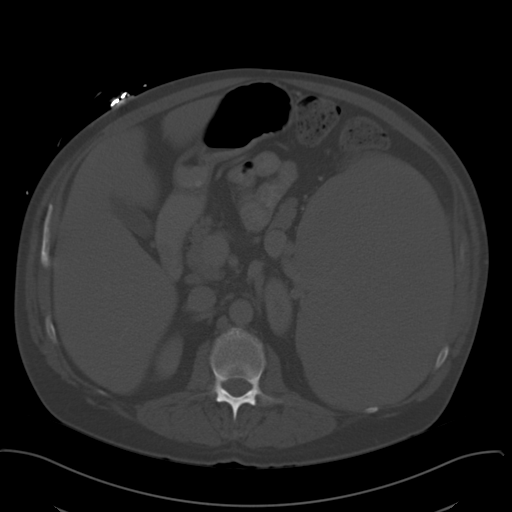
[im 73/93  soft-tissue]
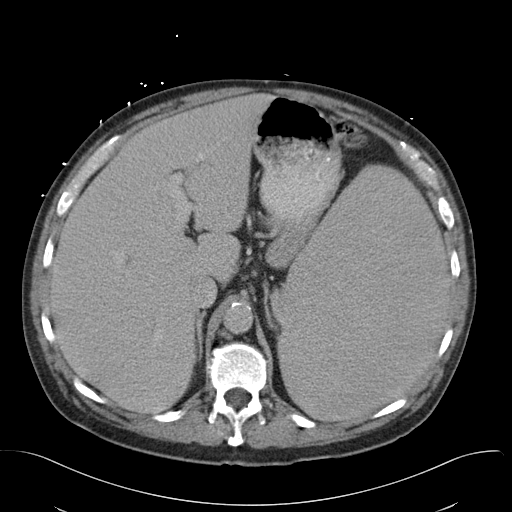
[im 73/93  lung]
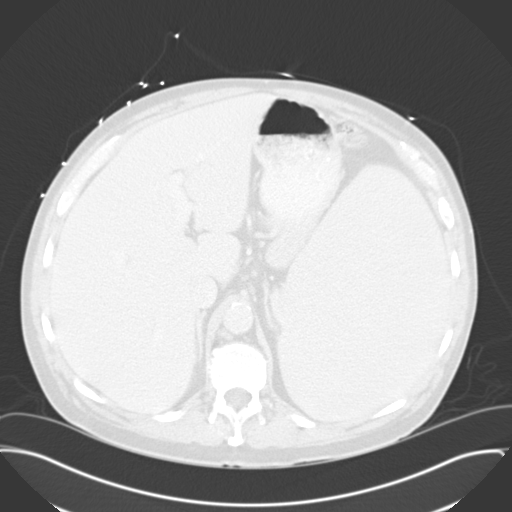
[im 78/93  soft-tissue]
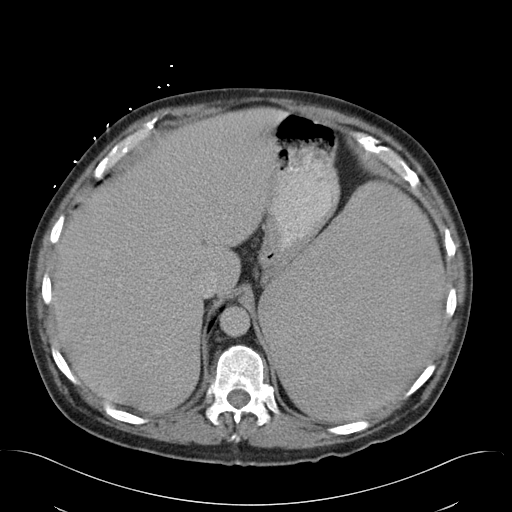
[im 78/93  lung]
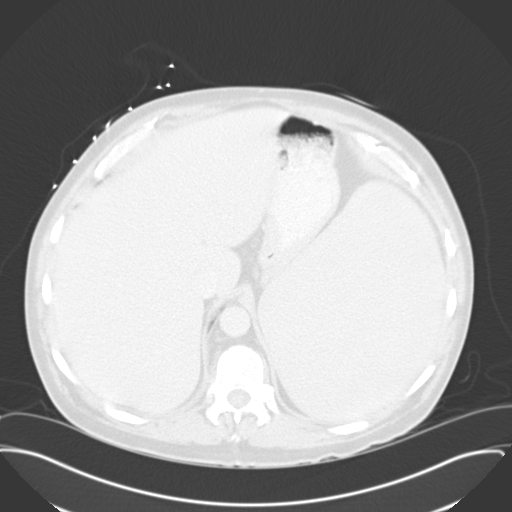
[im 83/93  lung]
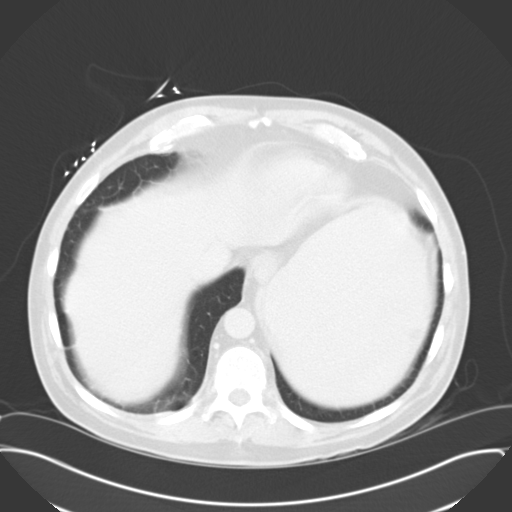
[im 88/93  soft-tissue]
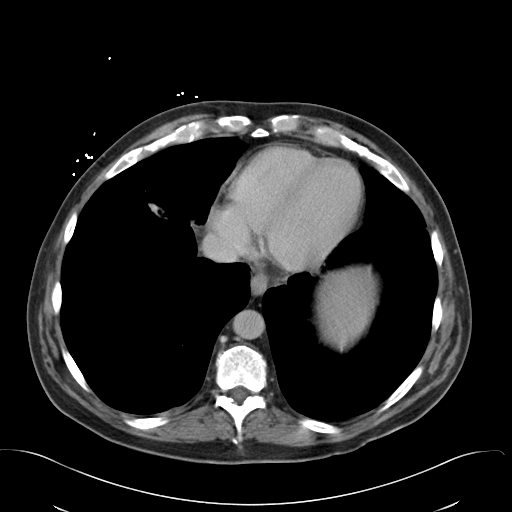
[im 88/93  lung]
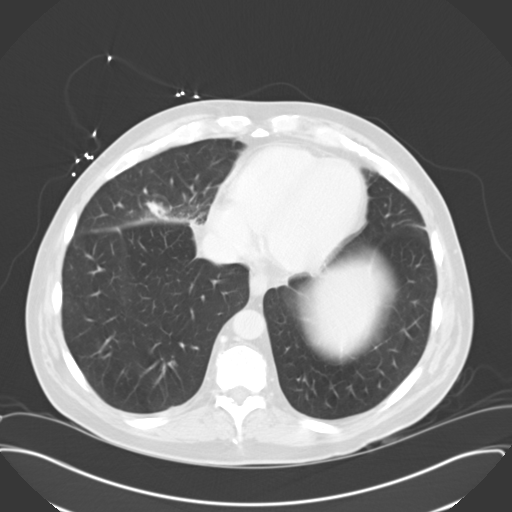

[13 of 32 positions shown; findings below may reference images not displayed]

FINDINGS: Lung bases: There is new peribronchovascular opacity in the right
middle lobe may be due to atelectasis. Infection is possible.
Remainder of the lung bases is clear. Heart is normal in size.

Hepatobiliary:  Unremarkable.

Spleen: Marked splenomegaly. Spleen currently measures 30 cm x 12 cm
x 23 cm, previously 22 cm x 8.7 cm x 16 cm. There are no splenic
masses or lesions.

Pancreas and adrenal glands:  Normal.

Kidneys, ureters, bladder: Left kidney is mildly displaced
inferiorly and medially by the enlarged spleen. No renal masses or
stones. No hydronephrosis. Normal ureters. Normal bladder.

The lymph nodes: No enlarged lymph nodes.

Ascites:  None.

Vascular: Mild dilation of the infrarenal abdominal aorta to 2.6 cm.
Mild atherosclerotic calcifications noted along the abdominal aorta
and iliac vessels.

Gastrointestinal: Bowel a and stomach are displaced by the enlarged
spleen. Bowel is otherwise unremarkable. Normal appendix is
visualized.

Abdominal wall: There is right inguinal hernia. A loop of small
bowel enters the hernia without evidence of obstruction,
strangulation or incarceration. There is a small fat containing
umbilical hernia.

Musculoskeletal: Mild disc degenerative changes noted throughout the
visualized spine. No osteoblastic or osteolytic lesions.
IMPRESSION: 1. Area peribronchovascular opacity in the central right middle lobe
is new from the prior CT. Bronchopneumonia is suspected given the
history of cough.
2. No other acute findings.
3. Marked splenomegaly with a significant increase in the size from
the prior CT as detailed above.
4. Small right inguinal hernia containing a loop of small bowel
without evidence of obstruction, incarceration or strangulation.
Small fat containing umbilical hernia. Mild degenerative changes
noted along the visualized spine.
5. Small infrarenal abdominal aortic aneurysm, now measuring 2.6 cm,
increased from 2.4 cm previously. Ectatic abdominal aorta at risk
for aneurysm development. Recommend followup by ultrasound in 5
years. This recommendation follows ACR consensus guidelines: White
Paper of the ACR Incidental Findings Committee II on Vascular
Findings. [HOSPITAL] [BU]; [DATE].

## 2015-07-26 MED ORDER — IOPAMIDOL (ISOVUE-300) INJECTION 61%
100.0000 mL | Freq: Once | INTRAVENOUS | Status: AC | PRN
  Administered 2015-07-26: 16:00:00 100 mL via INTRAVENOUS

## 2015-07-26 MED ORDER — DIATRIZOATE MEGLUMINE & SODIUM 66-10 % PO SOLN
15.0000 mL | ORAL | Status: AC
  Administered 2015-07-26 (×2): 15 mL via ORAL

## 2015-07-26 NOTE — Progress Notes (Addendum)
[facility] NAME: Marcus [name]    MR#:  [id number]  DATE OF BIRTH:  [birth year]  SUBJECTIVE:  CHIEF COMPLAINT:   Chief Complaint  Patient presents with  . Shortness of Breath  . Chest Pain   the patient is a 55 year old Caucasian male with past medical history significant for history of rheumatid arthritis, Felty syndrome, pancytopenia, hepatosplenomegaly, who presents to the hospital with worsening hoarseness, cough. In emergency room, he was noted to have significant pancytopenia and admitted to the hospital for further evaluation and treatment. The patient feels good today, still has hoarseness, admits of biopsy of vocal cords about a year or 2 ago at [facility], was told he had inflammation. Rheumatologist does not feel patient has Felty's syndrome, recommends to repeat a marrow biopsy .  Review of Systems  Constitutional: Negative for fever, chills and weight loss.  HENT: Positive for sore throat. Negative for congestion.   Eyes: Negative for blurred vision and double vision.  Respiratory: Positive for cough, sputum production and stridor. Negative for shortness of breath and wheezing.   Cardiovascular: Negative for chest pain, palpitations, orthopnea, leg swelling and PND.  Gastrointestinal: Negative for nausea, vomiting, abdominal pain, diarrhea, constipation and blood in stool.  Genitourinary: Negative for dysuria, urgency, frequency and hematuria.  Musculoskeletal: Negative for falls.  Neurological: Negative for dizziness, tremors, focal weakness and headaches.  Endo/Heme/Allergies: Does not bruise/bleed easily.  Psychiatric/Behavioral: Negative for depression. The patient does not have insomnia.     VITAL SIGNS: Blood pressure 113/56, pulse 82, temperature 98.4 F (36.9 C), temperature source Oral, resp. rate 18, height '5\' 10"'$  (1.778 m), weight 73.301 kg (161 lb 9.6 oz), SpO2 98 %.  PHYSICAL EXAMINATION:   GENERAL:  54  y.o.-year-old patient sitting on the edge of the bed with no acute distress. Oral mucosa has erosions, especially in the buccal surface. Pink complexion EYES: Pupils equal, round, reactive to light and accommodation. No scleral icterus. Extraocular muscles intact.  HEENT: Head atraumatic, normocephalic. Oropharynx and nasopharynx clear.  NECK:  Supple, no jugular venous distention. No thyroid enlargement, no tenderness.  LUNGS: Normal breath sounds bilaterally, no wheezing, rales,rhonchi or crepitation. No use of accessory muscles of respiration.  CARDIOVASCULAR: S1, S2 normal. No murmurs, rubs, or gallops.  ABDOMEN: Soft, nontender, nondistended. Bowel sounds present. No organomegaly or mass.  EXTREMITIES: No pedal edema, cyanosis, or clubbing.  NEUROLOGIC: Cranial nerves II through XII are intact. Muscle strength 5/5 in all extremities. Sensation intact. Gait not checked.  PSYCHIATRIC: The patient is alert and oriented x 3.  SKIN: No obvious rash, lesion, or ulcer.   ORDERS/RESULTS REVIEWED:   CBC  Recent Labs Lab 07/23/15 1514 07/24/15 0525 07/24/15 1328 07/25/15 0112 07/25/15 0606 07/26/15 0331  WBC 0.4* 0.6*  --  0.6* 0.8* 0.5*  HGB 6.9* 6.7* 6.8* 8.5* 8.6* 8.3*  HCT 21.4* 21.0*  --  26.2* 26.4* 25.8*  PLT 85* 76*  --  71* 78* 75*  MCV 66.3* 66.7*  --  69.2* 69.1* 69.8*  MCH 21.3* 21.4*  --  22.5* 22.6* 22.4*  MCHC 32.2 32.1  --  32.6 32.7 32.1  RDW 22.0* 22.2*  --  23.5* 23.5* 23.3*  LYMPHSABS  --   --  0.1*  --  0.4* 0.3*  MONOABS  --   --  0.1  --  0.2 0.1*  EOSABS  --   --  0.0  --  0.0 0.0  BASOSABS  --   --  0.0  --  0.0 0.0   ------------------------------------------------------------------------------------------------------------------  Chemistries   Recent Labs Lab 07/23/15 1514 07/24/15 0525 07/25/15 0112 07/25/15 1454 07/26/15 0331  NA 130* 133* 131*  --  136  K 3.9 3.9  --   --   --   CL 100* 104  --   --   --   CO2 22 25  --   --   --   GLUCOSE  137* 80  --   --   --   BUN 10 8  --   --   --   CREATININE 0.81 0.61  --   --   --   CALCIUM 7.9* 7.5*  --   --   --   AST  --   --   --  24  --   ALT  --   --   --  11*  --   ALKPHOS  --   --   --  48  --   BILITOT  --   --   --  0.8  --    ------------------------------------------------------------------------------------------------------------------ estimated creatinine clearance is 109 mL/min (by C-G formula based on Cr of 0.61). ------------------------------------------------------------------------------------------------------------------  Recent Labs  07/23/15 1514  TSH 1.910    Cardiac Enzymes  Recent Labs Lab 07/23/15 1514  TROPONINI <0.03   ------------------------------------------------------------------------------------------------------------------ Invalid input(s): POCBNP ---------------------------------------------------------------------------------------------------------------  RADIOLOGY: Dg Hand 2 View Right  07/25/2015  CLINICAL DATA:  Pain and swelling.  History of rheumatoid arthritis. EXAM: LEFT HAND - 2 VIEW; RIGHT HAND - 2 VIEW COMPARISON:  None. FINDINGS: Moderate periarticular osteopenia bilaterally. No significant degenerative changes involving the DIP or PIP joints of the fingers. The metacarpophalangeal joints are maintained. No joint space narrowing or erosive changes. Minimal degenerative changes at the radiocarpal joint. The intercarpal joint spaces are maintained. No erosive findings or chondrocalcinosis. IMPRESSION: Moderate periarticular osteopenia but no significant degenerative changes or erosive findings. Electronically Signed   By: Marijo Sanes M.D.   On: 07/25/2015 15:45   Dg Hand 2 View Left  07/25/2015  CLINICAL DATA:  Pain and swelling.  History of rheumatoid arthritis. EXAM: LEFT HAND - 2 VIEW; RIGHT HAND - 2 VIEW COMPARISON:  None. FINDINGS: Moderate periarticular osteopenia bilaterally. No significant degenerative changes  involving the DIP or PIP joints of the fingers. The metacarpophalangeal joints are maintained. No joint space narrowing or erosive changes. Minimal degenerative changes at the radiocarpal joint. The intercarpal joint spaces are maintained. No erosive findings or chondrocalcinosis. IMPRESSION: Moderate periarticular osteopenia but no significant degenerative changes or erosive findings. Electronically Signed   By: Marijo Sanes M.D.   On: 07/25/2015 15:45    EKG:  Orders placed or performed during the hospital encounter of 07/23/15  . ED EKG within 10 minutes  . ED EKG within 10 minutes  . EKG 12-Lead  . EKG 12-Lead  . EKG 12-Lead  . EKG 12-Lead    ASSESSMENT AND PLAN:  Active Problems:   Pancytopenia (HCC)   Protein-calorie malnutrition, severe  #1. Pancytopenia, patient was  transfused packed red blood cells, hemoglobin level is improved to 8.6 yesterday, 8.3 today, remains stable, the patient remains severely neutropenic despite steroids, neutropenic precautions  to be continued, Rheumatologist recommends bone marrow biopsy, discussed with Dr. Jefm Bryant  today . Attempted to reach Dr. Mike Gip, we'll discuss later in the day. Getting CT of abdomen and pelvis out hepatosplenomegaly, ascites #2, protein calorie malnutrition, severe, appreciate dietary input, diet was advanced to regular with neutropenic precautions #  3. Acute bronchitis, continue antibiotic therapy, changed to oral, unable to obtain sputum cultures, blood cultures are negative so far, patient is afebrile #4. Hyponatremia, worsened with IV fluid administration, questionable SIADH, ordered, not obtained,  urine and serum osmolalities , initiate fluid restriction if urine osmolarity is high #5 . Hyperglycemia, hemoglobin A1c is 5.1, no diabetes #6. Relative hypotension, off IV fluids, status post transfusion of packed red blood cells, stable blood pressure readings  #7. Generalized weakness, physical therapy consultation was  requested. No follow-up was recommended #8, hoarseness, get ENT involved again to rule out pathology. Management plans discussed with the patient, family and they are in agreement.   DRUG ALLERGIES: No Known Allergies  CODE STATUS:     Code Status Orders        Start     Ordered   07/23/15 1924  Full code   Continuous     07/23/15 1923    Code Status History    Date Active Date Inactive Code Status Order ID Comments User Context   This patient has a current code status but no historical code status.      TOTAL TIME TAKING CARE OF THIS PATIENT: 40 minutes.   Discussed with Dr. Jefm Bryant, rheumatologist  Jaishon Krisher M.D on 07/26/2015 at 1:24 PM  Between 7am to 6pm - Pager - [phone]  After 6pm go to [email or web address] - password [email or web address] [facility]  [facility]  Office  [phone]  CC: Primary care physician; No PCP Per Patient

## 2015-07-26 NOTE — Consult Note (Signed)
..   Marcus, [name] Aug 15, 1960 Marcus Grist, MD  Reason for Consult: dysphonia  HPI: 55 y.o. Male with history of RA and Felty's syndrome admitted for neutropenia and shortness of breath.  Reports had biopsy of larynx and epiglottis last year at [name] and was told had "inflammation".  Reports has been hoarse off and one since then.  His hoarseness today is not abnormal per patient.  He normally will get ear pain and then throat discomfort and then hoarseness.  Denies any current breathing difficulty.  Used to work in Teacher, adult education care and reports would have "knots" come up behind knee at times and sore feet frequently.  Allergies: No Known Allergies  ROS: Review of systems normal other than 12 systems except per HPI.  PMH:  Past Medical History  Diagnosis Date  . Felty syndrome (Marcus [name])   . Pancytopenia (Marcus [name])   . Seropositive rheumatoid arthritis (Marcus [name])   . Tobacco use disorder     FH:  Family History  Problem Relation Age of Onset  . CAD Brother   . COPD Brother     SH:  Social History   Social History  . Marital Status: Married    Spouse Name: N/A  . Number of Children: N/A  . Years of Education: N/A   Occupational History  . Not on file.   Social History Main Topics  . Smoking status: Current Every Day Smoker -- 1.00 packs/day    Types: Cigarettes  . Smokeless tobacco: Not on file  . Alcohol Use: No  . Drug Use: Yes    Special: Marijuana  . Sexual Activity: Not on file   Other Topics Concern  . Not on file   Social History Narrative   Lives at home with Mother. Ambulates independently.    PSH: History reviewed. No pertinent past surgical history.  Physical  Exam:  GEN-  CN 2-12 grossly intact and symmetric. Voice coarse and gravely, no stridor EARS- external ears WNL OC/OP- Oral cavity, lips, gums, ororpharynx normal with no masses or lesions.  Denture in place EXT- Skin warm and dry.  NOSE- Nasal cavity with crusting and thick secretions bilaterally,  fullness of right inferior turbinate with possible mild polypoid degeneration versus normal variant NECK- No lymphadenopathy palpated. Thyroid normal with no masses.  Trans-nasal flexible laryngoscopy-  After verbal consent was obtained, the patient was anesthetized with gennasal and lidocaine solution 0.35ml.  Flexible laryngoscope was inserted into the patient's bilateral nares.  This demonstrated crusting and fullness to right middle turbinate as well as thickened secretions.  No abnormality of nasopharynx or oropharynx.  Epiglottis thickened but consistent with chronic thickening without edema.  Apparent scar from previous biopsy sites or previous ulcerations that have healed along lingual and laryngeal surface of epiglottis.  Scar along left true vocal fold with granuloma or scare along mid portion of left cord.  No exophytic mass or lesions.  Moderate cobblestoning and moderate post-cricoid edema.   A/P: Dysphonia with history of laryngeal abnormalities s/p biopsy at [name]  Plan:  Rec treatment for GERD as continues to have signs.  Consider speech therapy for chronic changes seen on exam but no acute lesions identified.  Follow up as outpatient for video-stroboscopy to evaluation left cord swelling and see if scar from previous infections/surgeries or if fluctuates.   Marcus [name] 07/26/2015 5:12 PM

## 2015-07-27 ENCOUNTER — Inpatient Hospital Stay: Payer: Self-pay

## 2015-07-27 DIAGNOSIS — J189 Pneumonia, unspecified organism: Principal | ICD-10-CM

## 2015-07-27 LAB — CBC WITH DIFFERENTIAL/PLATELET
Basophils Absolute: 0 10*3/uL (ref 0–0.1)
Basophils Relative: 1 %
Eosinophils Absolute: 0 10*3/uL (ref 0–0.7)
Eosinophils Relative: 4 %
HCT: 26.2 % — ABNORMAL LOW (ref 40.0–52.0)
Hemoglobin: 8.6 g/dL — ABNORMAL LOW (ref 13.0–18.0)
Lymphocytes Relative: 68 %
Lymphs Abs: 0.4 10*3/uL — ABNORMAL LOW (ref 1.0–3.6)
MCH: 22.9 pg — ABNORMAL LOW (ref 26.0–34.0)
MCHC: 32.6 g/dL (ref 32.0–36.0)
MCV: 70.2 fL — ABNORMAL LOW (ref 80.0–100.0)
Monocytes Absolute: 0.1 10*3/uL — ABNORMAL LOW (ref 0.2–1.0)
Monocytes Relative: 16 %
Neutro Abs: 0.1 10*3/uL — ABNORMAL LOW (ref 1.4–6.5)
Neutrophils Relative %: 11 %
Platelets: 84 10*3/uL — ABNORMAL LOW (ref 150–440)
RBC: 3.74 MIL/uL — ABNORMAL LOW (ref 4.40–5.90)
RDW: 23.6 % — ABNORMAL HIGH (ref 11.5–14.5)
WBC: 0.6 10*3/uL — CL (ref 3.8–10.6)

## 2015-07-27 LAB — RETICULOCYTES
RBC.: 3.74 MIL/uL — ABNORMAL LOW (ref 4.40–5.90)
Retic Count, Absolute: 134.6 10*3/uL (ref 19.0–183.0)
Retic Ct Pct: 3.6 % — ABNORMAL HIGH (ref 0.4–3.1)

## 2015-07-27 LAB — RAPID HIV SCREEN (HIV 1/2 AB+AG)
HIV 1/2 Antibodies: NONREACTIVE
HIV-1 P24 Antigen - HIV24: NONREACTIVE

## 2015-07-27 IMAGING — CT CT BIOPSY
1 of 2 series · 14 of 32 positions shown, 19 images · non-contrast
Comparison: none

INDICATION: History of Felty syndrome and pancytopenia. Please perform CT-guided
bone marrow biopsy for tissue diagnostic purposes.

[Series 3: osteo bx 2.4 b70s · axial · 0.74mm/px · z∈[+1109,+1121]mm · 14 of 18 slices shown, 19 images]
[im 2/18  soft-tissue]
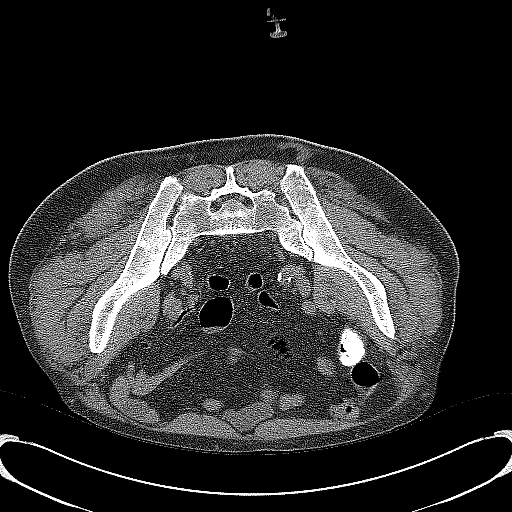
[im 2/18  bone]
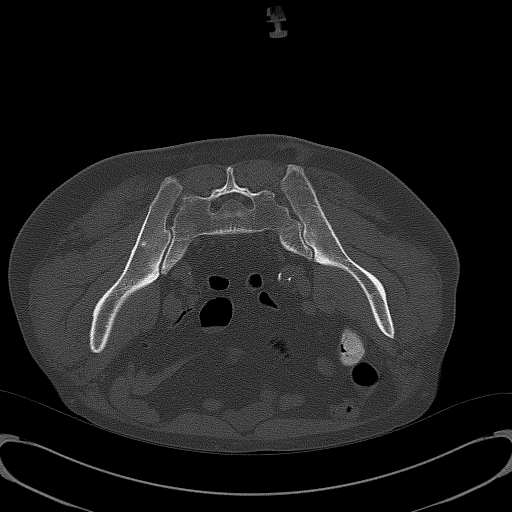
[im 3/18  soft-tissue]
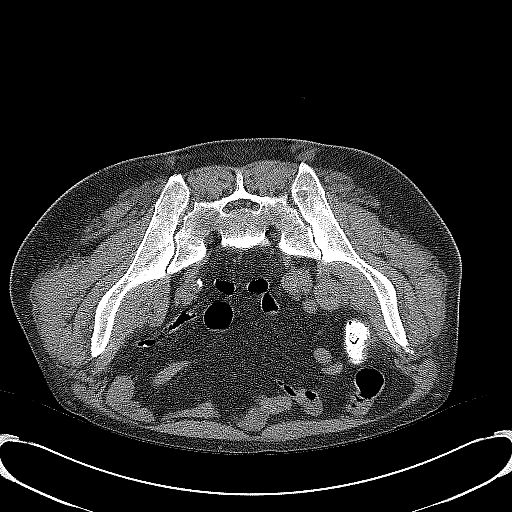
[im 4/18  soft-tissue]
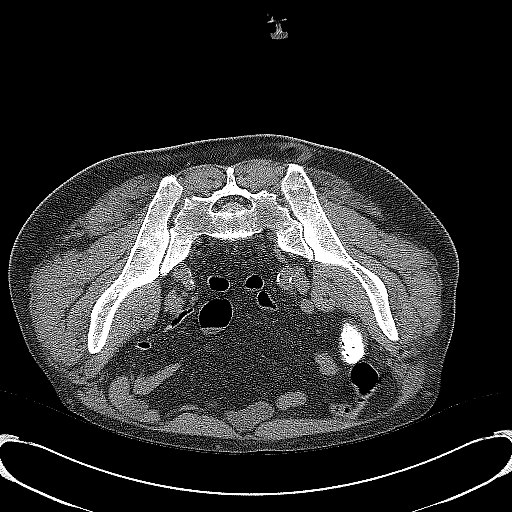
[im 6/18  soft-tissue]
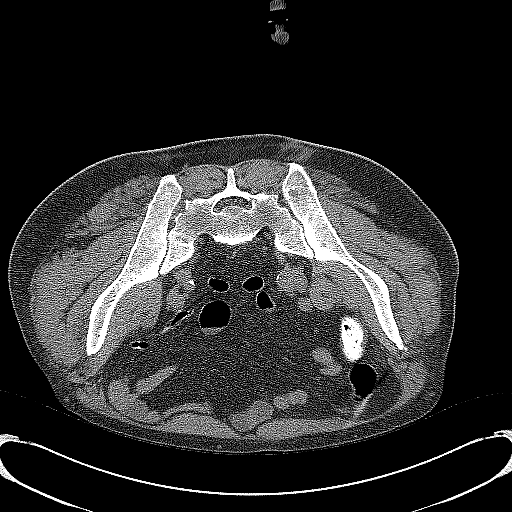
[im 7/18  soft-tissue]
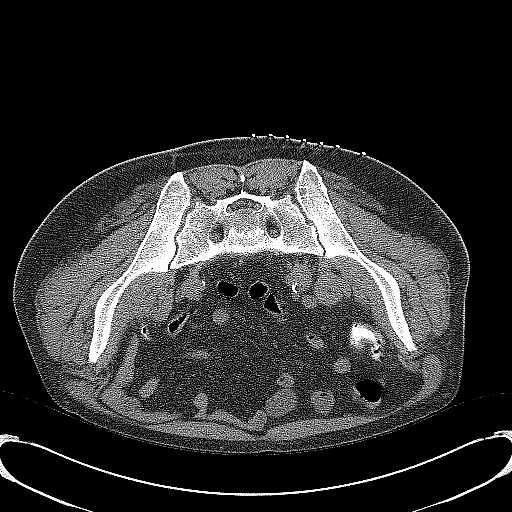
[im 8/18  soft-tissue]
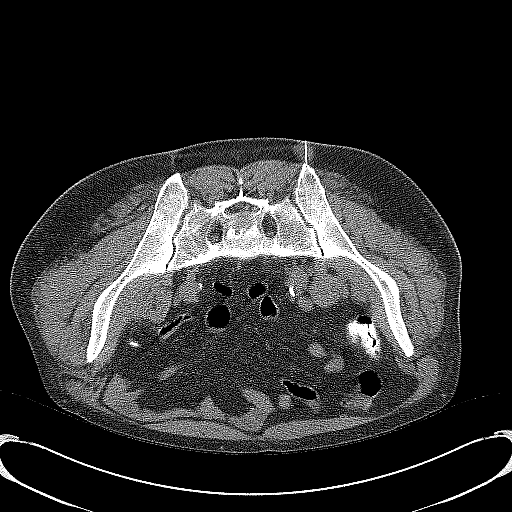
[im 9/18  soft-tissue]
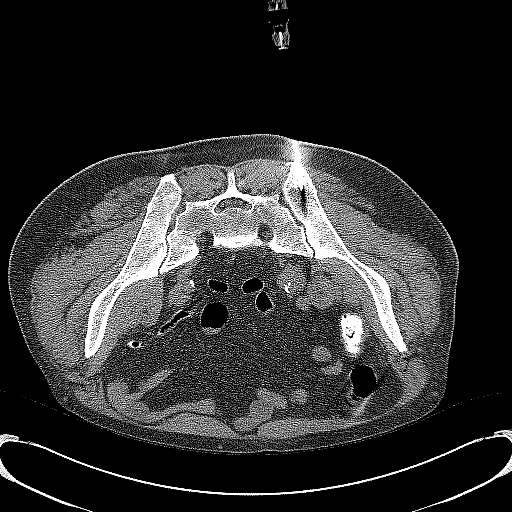
[im 10/18  soft-tissue]
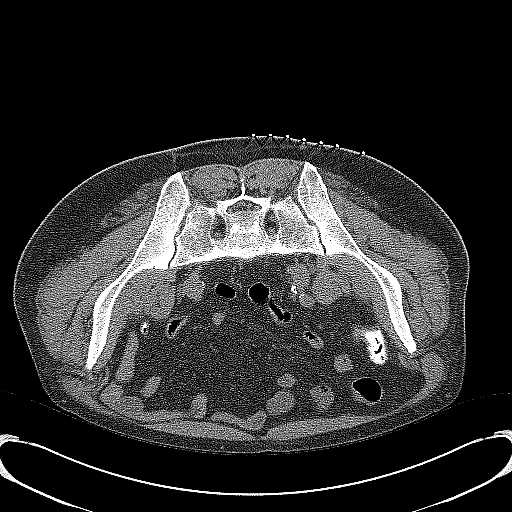
[im 11/18  soft-tissue]
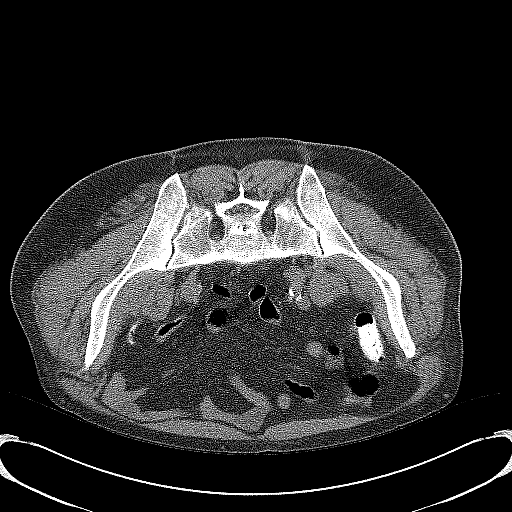
[im 11/18  bone]
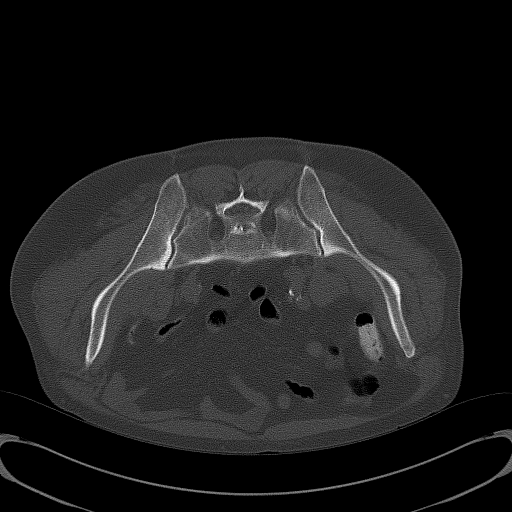
[im 12/18  soft-tissue]
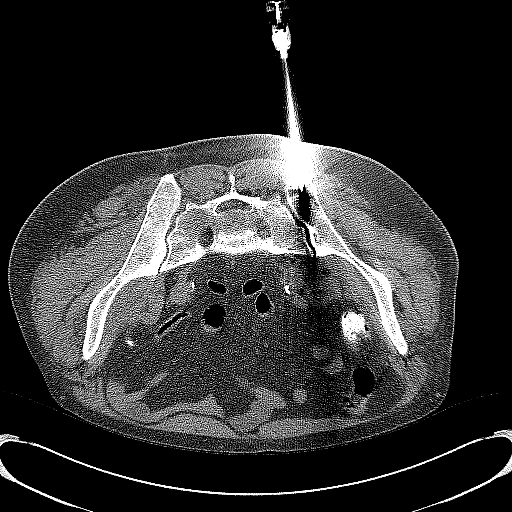
[im 13/18  lung]
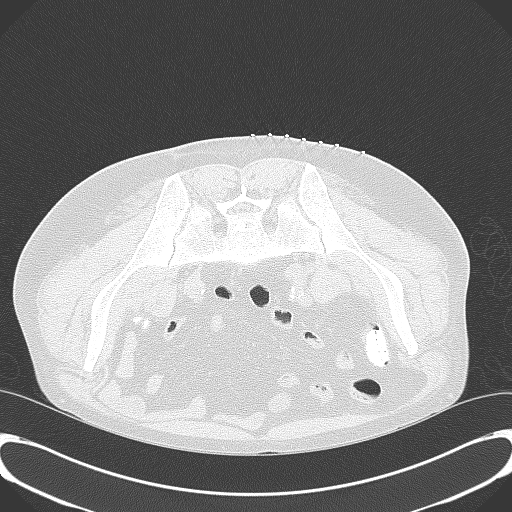
[im 14/18  soft-tissue]
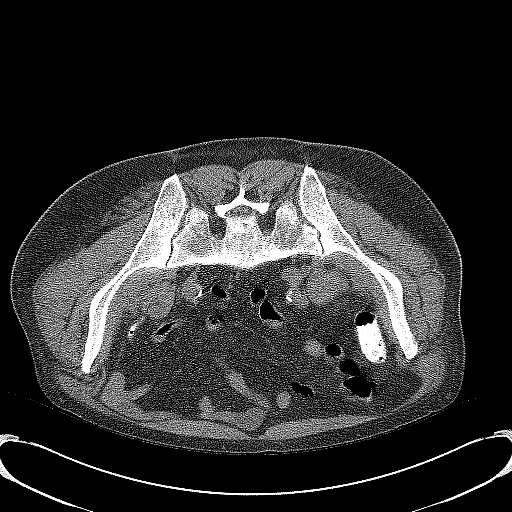
[im 14/18  lung]
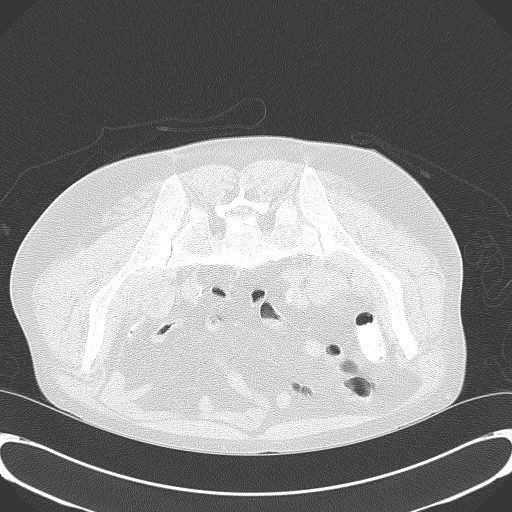
[im 15/18  soft-tissue]
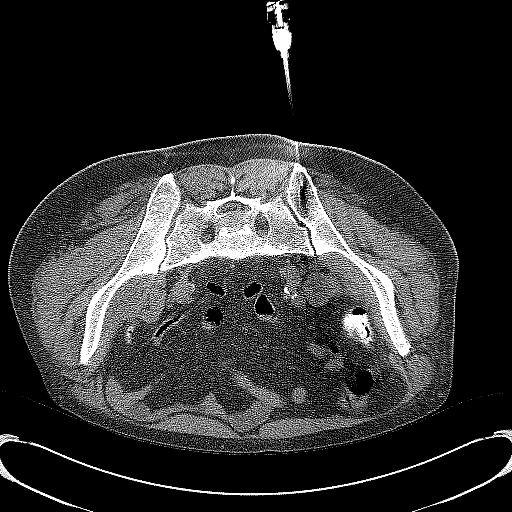
[im 15/18  lung]
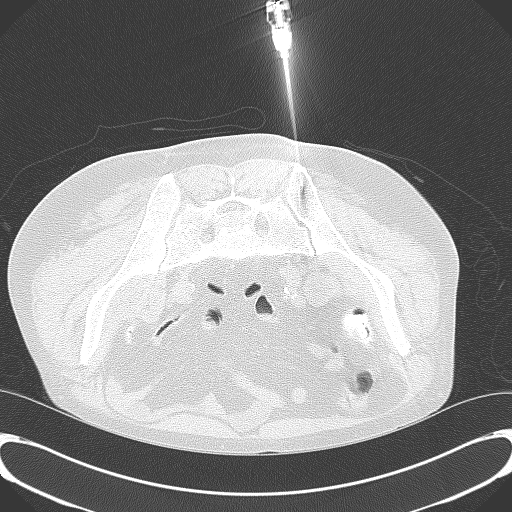
[im 16/18  soft-tissue]
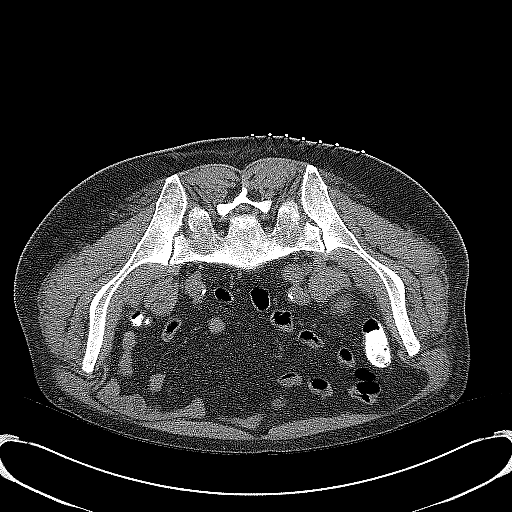
[im 16/18  lung]
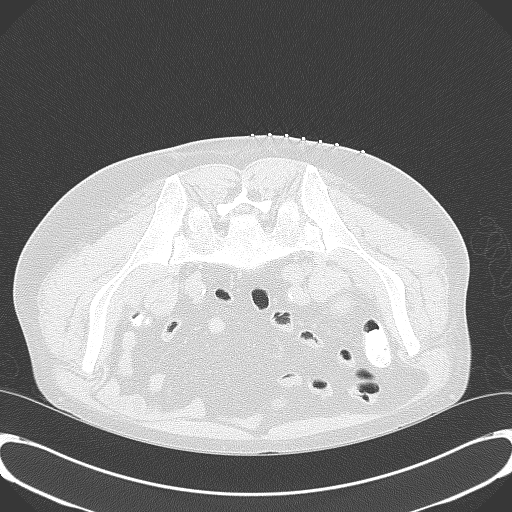

[14 of 32 positions shown; findings below may reference images not displayed]

EXAM:
CT-GUIDED BONE MARROW BIOPSY AND ASPIRATION

MEDICATIONS:
None

ANESTHESIA/SEDATION:
Fentanyl 50 mcg IV; Versed 2 mg IV

Sedation Time: 10 minutes; The patient was continuously monitored
during the procedure by the interventional radiology nurse under my
direct supervision.

COMPLICATIONS:
None immediate.

PROCEDURE:
Informed consent was obtained from the patient following an
explanation of the procedure, risks, benefits and alternatives. The
patient understands, agrees and consents for the procedure. All
questions were addressed. A time out was performed prior to the
initiation of the procedure. The patient was positioned prone and
non-contrast localization CT was performed of the pelvis to
demonstrate the iliac marrow spaces. The operative site was prepped
and draped in the usual sterile fashion.

Under sterile conditions and local anesthesia, a 22 gauge spinal
needle was utilized for procedural planning. Next, an 11 gauge
coaxial bone biopsy needle was advanced into the left iliac marrow
space. Needle position was confirmed with CT imaging. Initially,
bone marrow aspiration was performed. Next, a bone marrow biopsy was
obtained with the 11 gauge outer bone marrow device. Samples were
prepared with the cytotechnologist and deemed adequate. The needle
was removed intact. Hemostasis was obtained with compression and a
dressing was placed. The patient tolerated the procedure well
without immediate post procedural complication.
IMPRESSION: Successful CT guided left iliac bone marrow aspiration and core
biopsy.

## 2015-07-27 MED ORDER — HEPARIN SOD (PORK) LOCK FLUSH 100 UNIT/ML IV SOLN
INTRAVENOUS | Status: AC
  Filled 2015-07-27: qty 5

## 2015-07-27 MED ORDER — FENTANYL CITRATE (PF) 100 MCG/2ML IJ SOLN
INTRAMUSCULAR | Status: AC | PRN
  Administered 2015-07-27: 13:00:00 50 ug via INTRAVENOUS

## 2015-07-27 MED ORDER — FENTANYL CITRATE (PF) 100 MCG/2ML IJ SOLN
INTRAMUSCULAR | Status: AC
  Filled 2015-07-27: qty 4

## 2015-07-27 MED ORDER — MIDAZOLAM HCL 5 MG/5ML IJ SOLN
INTRAMUSCULAR | Status: AC
  Filled 2015-07-27: qty 5

## 2015-07-27 MED ORDER — SODIUM CHLORIDE 0.9 % IV SOLN: INTRAVENOUS | Status: DC

## 2015-07-27 MED ORDER — MIDAZOLAM HCL 2 MG/2ML IJ SOLN
INTRAMUSCULAR | Status: AC | PRN
  Administered 2015-07-27: 2 mg via INTRAVENOUS

## 2015-07-27 NOTE — Progress Notes (Signed)
[place] [facility] [place] NAME: Marcus [name]    MR#:  [id number]  DATE OF BIRTH:  [birth year]  SUBJECTIVE:  CHIEF COMPLAINT:   Chief Complaint  Patient presents with  . Shortness of Breath  . Chest Pain   the patient is a 55 year old Caucasian male with past medical history significant for history of rheumatid arthritis, Felty syndrome, pancytopenia, hepatosplenomegaly, who presents to the hospital with worsening hoarseness, cough. In emergency room, he was noted to have significant pancytopenia and admitted to the hospital for further evaluation and treatment. The patient feels good today, still has hoarseness, admits of biopsy of vocal cords about a year or 2 ago at [facility], was told he had inflammation. Rheumatologist does not feel patient has Felty's syndrome, recommends to repeat a marrow biopsy, which is being planned for today 21st of April 2017. Patient complains of no significant discomfort at present, overall feels good and improved, oral intake has improved .  Review of Systems  Constitutional: Negative for fever, chills and weight loss.  HENT: Positive for sore throat. Negative for congestion.   Eyes: Negative for blurred vision and double vision.  Respiratory: Positive for cough, sputum production and stridor. Negative for shortness of breath and wheezing.   Cardiovascular: Negative for chest pain, palpitations, orthopnea, leg swelling and PND.  Gastrointestinal: Negative for nausea, vomiting, abdominal pain, diarrhea, constipation and blood in stool.  Genitourinary: Negative for dysuria, urgency, frequency and hematuria.  Musculoskeletal: Negative for falls.  Neurological: Negative for dizziness, tremors, focal weakness and headaches.  Endo/Heme/Allergies: Does not bruise/bleed easily.  Psychiatric/Behavioral: Negative for depression. The patient does not have insomnia.     VITAL SIGNS: Blood pressure 99/70, pulse 85, temperature 98.1 F  (36.7 C), temperature source Oral, resp. rate 20, height '5\' 10"'$  (1.778 m), weight 73.301 kg (161 lb 9.6 oz), SpO2 98 %.  PHYSICAL EXAMINATION:   GENERAL:  55 y.o.-year-old patient sitting on the edge of the bed with no acute distress. Oral mucosa has erosions, especially in the buccal surface. Pink complexion EYES: Pupils equal, round, reactive to light and accommodation. No scleral icterus. Extraocular muscles intact.  HEENT: Head atraumatic, normocephalic. Oropharynx and nasopharynx clear.  NECK:  Supple, no jugular venous distention. No thyroid enlargement, no tenderness.  LUNGS: Normal breath sounds bilaterally, no wheezing, rales,rhonchi or crepitation. No use of accessory muscles of respiration.  CARDIOVASCULAR: S1, S2 normal. No murmurs, rubs, or gallops.  ABDOMEN: Soft, nontender, nondistended. Bowel sounds present. No organomegaly or mass.  EXTREMITIES: No pedal edema, cyanosis, or clubbing.  NEUROLOGIC: Cranial nerves II through XII are intact. Muscle strength 5/5 in all extremities. Sensation intact. Gait not checked.  PSYCHIATRIC: The patient is alert and oriented x 3.  SKIN: No obvious rash, lesion, or ulcer.   ORDERS/RESULTS REVIEWED:   CBC  Recent Labs Lab 07/24/15 0525 07/24/15 1328 07/25/15 0112 07/25/15 0606 07/26/15 0331 07/27/15 0541  WBC 0.6*  --  0.6* 0.8* 0.5* 0.6*  HGB 6.7* 6.8* 8.5* 8.6* 8.3* 8.6*  HCT 21.0*  --  26.2* 26.4* 25.8* 26.2*  PLT 76*  --  71* 78* 75* 84*  MCV 66.7*  --  69.2* 69.1* 69.8* 70.2*  MCH 21.4*  --  22.5* 22.6* 22.4* 22.9*  MCHC 32.1  --  32.6 32.7 32.1 32.6  RDW 22.2*  --  23.5* 23.5* 23.3* 23.6*  LYMPHSABS  --  0.1*  --  0.4* 0.3* 0.4*  MONOABS  --  0.1  --  0.2 0.1* 0.1*  EOSABS  --  0.0  --  0.0 0.0 0.0  BASOSABS  --  0.0  --  0.0 0.0 0.0   ------------------------------------------------------------------------------------------------------------------  Chemistries   Recent Labs Lab 07/23/15 1514 07/24/15 0525  07/25/15 0112 07/25/15 1454 07/26/15 0331  NA 130* 133* 131*  --  136  K 3.9 3.9  --   --  4.4  CL 100* 104  --   --   --   CO2 22 25  --   --   --   GLUCOSE 137* 80  --   --   --   BUN 10 8  --   --   --   CREATININE 0.81 0.61  --   --   --   CALCIUM 7.9* 7.5*  --   --   --   AST  --   --   --  24  --   ALT  --   --   --  11*  --   ALKPHOS  --   --   --  48  --   BILITOT  --   --   --  0.8  --    ------------------------------------------------------------------------------------------------------------------ estimated creatinine clearance is 109 mL/min (by C-G formula based on Cr of 0.61). ------------------------------------------------------------------------------------------------------------------ No results for input(s): TSH, T4TOTAL, T3FREE, THYROIDAB in the last 72 hours.  Invalid input(s): FREET3  Cardiac Enzymes  Recent Labs Lab 07/23/15 1514  TROPONINI <0.03   ------------------------------------------------------------------------------------------------------------------ Invalid input(s): POCBNP ---------------------------------------------------------------------------------------------------------------  RADIOLOGY: Ct Abdomen Pelvis W Contrast  07/26/2015  CLINICAL DATA:  55 year old Caucasian male with past medical history significant for history of rheumatid arthritis, Felty syndrome, pancytopenia, hepatosplenomegaly, who presents to the hospital with worsening hoarseness, cough. In emergency room, he was noted to have significant pancytopenia and admitted to the hospital for further evaluation and treatment. No surgical hx EXAM: CT ABDOMEN AND PELVIS WITH CONTRAST TECHNIQUE: Multidetector CT imaging of the abdomen and pelvis was performed using the standard protocol following bolus administration of intravenous contrast. CONTRAST:  159m ISOVUE-300 IOPAMIDOL (ISOVUE-300) INJECTION 61% COMPARISON:  06/14/2014 FINDINGS: Lung bases: There is new  peribronchovascular opacity in the right middle lobe may be due to atelectasis. Infection is possible. Remainder of the lung bases is clear. Heart is normal in size. Hepatobiliary:  Unremarkable. Spleen: Marked splenomegaly. Spleen currently measures 30 cm x 12 cm x 23 cm, previously 22 cm x 8.7 cm x 16 cm. There are no splenic masses or lesions. Pancreas and adrenal glands:  Normal. Kidneys, ureters, bladder: Left kidney is mildly displaced inferiorly and medially by the enlarged spleen. No renal masses or stones. No hydronephrosis. Normal ureters. Normal bladder. The lymph nodes: No enlarged lymph nodes. Ascites:  None. Vascular: Mild dilation of the infrarenal abdominal aorta to 2.6 cm. Mild atherosclerotic calcifications noted along the abdominal aorta and iliac vessels. Gastrointestinal: Bowel a and stomach are displaced by the enlarged spleen. Bowel is otherwise unremarkable. Normal appendix is visualized. Abdominal wall: There is right inguinal hernia. A loop of small bowel enters the hernia without evidence of obstruction, strangulation or incarceration. There is a small fat containing umbilical hernia. Musculoskeletal: Mild disc degenerative changes noted throughout the visualized spine. No osteoblastic or osteolytic lesions. IMPRESSION: 1. Area peribronchovascular opacity in the central right middle lobe is new from the prior CT. Bronchopneumonia is suspected given the history of cough. 2. No other acute findings. 3. Marked splenomegaly with a significant increase in the size from the prior CT  as detailed above. 4. Small right inguinal hernia containing a loop of small bowel without evidence of obstruction, incarceration or strangulation. Small fat containing umbilical hernia. Mild degenerative changes noted along the visualized spine. 5. Small infrarenal abdominal aortic aneurysm, now measuring 2.6 cm, increased from 2.4 cm previously. Ectatic abdominal aorta at risk for aneurysm development. Recommend  followup by ultrasound in 5 years. This recommendation follows ACR consensus guidelines: White Paper of the ACR Incidental Findings Committee II on Vascular Findings. J Am Coll Radiol 2013; 10:789-794. Electronically Signed   By: Lajean Manes M.D.   On: 07/26/2015 16:46   Dg Hand 2 View Right  07/25/2015  CLINICAL DATA:  Pain and swelling.  History of rheumatoid arthritis. EXAM: LEFT HAND - 2 VIEW; RIGHT HAND - 2 VIEW COMPARISON:  None. FINDINGS: Moderate periarticular osteopenia bilaterally. No significant degenerative changes involving the DIP or PIP joints of the fingers. The metacarpophalangeal joints are maintained. No joint space narrowing or erosive changes. Minimal degenerative changes at the radiocarpal joint. The intercarpal joint spaces are maintained. No erosive findings or chondrocalcinosis. IMPRESSION: Moderate periarticular osteopenia but no significant degenerative changes or erosive findings. Electronically Signed   By: Marijo Sanes M.D.   On: 07/25/2015 15:45   Dg Hand 2 View Left  07/25/2015  CLINICAL DATA:  Pain and swelling.  History of rheumatoid arthritis. EXAM: LEFT HAND - 2 VIEW; RIGHT HAND - 2 VIEW COMPARISON:  None. FINDINGS: Moderate periarticular osteopenia bilaterally. No significant degenerative changes involving the DIP or PIP joints of the fingers. The metacarpophalangeal joints are maintained. No joint space narrowing or erosive changes. Minimal degenerative changes at the radiocarpal joint. The intercarpal joint spaces are maintained. No erosive findings or chondrocalcinosis. IMPRESSION: Moderate periarticular osteopenia but no significant degenerative changes or erosive findings. Electronically Signed   By: Marijo Sanes M.D.   On: 07/25/2015 15:45   Ct Biopsy  07/27/2015  INDICATION: History of Felty syndrome and pancytopenia. Please perform CT-guided bone marrow biopsy for tissue diagnostic purposes. EXAM: CT-GUIDED BONE MARROW BIOPSY AND ASPIRATION MEDICATIONS: None  ANESTHESIA/SEDATION: Fentanyl 50 mcg IV; Versed 2 mg IV Sedation Time: 10 minutes; The patient was continuously monitored during the procedure by the interventional radiology nurse under my direct supervision. COMPLICATIONS: None immediate. PROCEDURE: Informed consent was obtained from the patient following an explanation of the procedure, risks, benefits and alternatives. The patient understands, agrees and consents for the procedure. All questions were addressed. A time out was performed prior to the initiation of the procedure. The patient was positioned prone and non-contrast localization CT was performed of the pelvis to demonstrate the iliac marrow spaces. The operative site was prepped and draped in the usual sterile fashion. Under sterile conditions and local anesthesia, a 22 gauge spinal needle was utilized for procedural planning. Next, an 11 gauge coaxial bone biopsy needle was advanced into the left iliac marrow space. Needle position was confirmed with CT imaging. Initially, bone marrow aspiration was performed. Next, a bone marrow biopsy was obtained with the 11 gauge outer bone marrow device. Samples were prepared with the cytotechnologist and deemed adequate. The needle was removed intact. Hemostasis was obtained with compression and a dressing was placed. The patient tolerated the procedure well without immediate post procedural complication. IMPRESSION: Successful CT guided left iliac bone marrow aspiration and core biopsy. Electronically Signed   By: Sandi Mariscal M.D.   On: 07/27/2015 14:10    EKG:  Orders placed or performed during the hospital encounter of 07/23/15  .  ED EKG within 10 minutes  . ED EKG within 10 minutes  . EKG 12-Lead  . EKG 12-Lead  . EKG 12-Lead  . EKG 12-Lead    ASSESSMENT AND PLAN:  Active Problems:   Pancytopenia (HCC)   Protein-calorie malnutrition, severe  #1. Pancytopenia, patient was  transfused packed red blood cells, hemoglobin level is improved to  8.6 today, remains stable.  The patient remains severely neutropenic, neutropenic precautions  to be continued, Rheumatologist recommends bone marrow biopsy, to be performed today. CT of abdomen and pelvis will splenomegaly, but no ascites or hepatomegaly.  #2, protein calorie malnutrition, severe, appreciate dietary input, diet was advanced to regular with neutropenic precautions #3. Acute bronchitis, continue antibiotic therapy for 7 day course, unable to obtain sputum cultures, blood cultures are negative so far, patient is afebrile #4. Hyponatremia, worsened with IV fluid administration, unlikely SIADH, since patient's urine osmolarity is low at 215, improved  #5 . Hyperglycemia, hemoglobin A1c is 5.1, no diabetes #6. Relative hypotension, off IV fluids, status post transfusion of packed red blood cells, stable blood pressure readings  #7. Generalized weakness, physical therapy consultation was requested. No follow-up was recommended #8, hoarseness, the patient was seen by  ENT yesterday, recommended to gastroesophageal reflux disease therapy, exam did not show acute lesions. Patient was advised to follow-up with ENT for video- stroboscopy to evaluate left cord swelling.  Management plans discussed with the patient, family and they are in agreement.   DRUG ALLERGIES: No Known Allergies  CODE STATUS:     Code Status Orders        Start     Ordered   07/23/15 1924  Full code   Continuous     07/23/15 1923    Code Status History    Date Active Date Inactive Code Status Order ID Comments User Context   This patient has a current code status but no historical code status.      TOTAL TIME TAKING CARE OF THIS PATIENT: 40 minutes.  Discussed with Dr. Jaymes Graff M.D on 07/27/2015 at 2:59 PM  Between 7am to 6pm - Pager - [phone]  After 6pm go to [email or web address] - password [email or web address] [facility]  [place] [facility]  Office  [phone]  CC: Primary care physician; No  PCP Per Patient

## 2015-07-27 NOTE — Progress Notes (Signed)
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  Subjective:  Mouth sore is slightly improved.  Still hoarse and has a cough.  No increased shortness of breath.    Social History: The patient is accompanied by his brother today.  Allergies: No Known Allergies  Scheduled Medications: . antiseptic oral rinse  7 mL Mouth Rinse q12n4p  . azithromycin  500 mg Intravenous Q24H  . cefTRIAXone (ROCEPHIN)  IV  1 g Intravenous Q24H  . chlorhexidine  15 mL Mouth Rinse BID  . feeding supplement (ENSURE ENLIVE)  237 mL Oral BID BM  . nicotine  21 mg Transdermal Daily  . predniSONE  30 mg Oral Q breakfast  . sodium chloride flush  3 mL Intravenous Q12H    Review of Systems: GENERAL: Fatigue. No fevers or sweats. Weight loss of 24 pounds in 4 months. PERFORMANCE STATUS (ECOG): 2 HEENT: Mouth sore, improving. Sore throat. Runny nose. Hoarse. No visual changes. Lungs: No shortness of breath or cough. Phlegm. No hemoptysis. Cardiac: No chest pain, palpitations, orthopnea, or PND. GI: Poor appetite. Nausea. No vomiting, diarrhea, constipation, melena or hematochezia. GU: No urgency, frequency, dysuria, or hematuria. Musculoskeletal: Arthritis hands, knees, elbows. No muscle tenderness. Extremities: Arthritis. No lower extremity swelling. Skin: No rashes or skin changes. Neuro: No headache, numbness or weakness, balance or coordination issues. Endocrine: No diabetes, thyroid issues, hot flashes or night sweats. Psych: No mood changes, depression or anxiety. Pain: No focal pain. Review of systems: All other systems reviewed and found to be negative.  Physical Exam: Blood pressure 129/85, pulse 99, temperature 97.8 F (36.6 C), temperature source Oral, resp. rate 18,  height '5\' 10"'$  (1.778 m), weight 161 lb 9.6 oz (73.301 kg), SpO2 98 %.  GENERAL: Well developed, well nourished, sitting comfortably on the medical unit in no acute distress. MENTAL STATUS: Alert and oriented to person, place and time. HEAD: Short black hair with graying. [name]. Normocephalic, atraumatic, face symmetric, no Cushingoid features. EYES: Blue-gray eyes. Pupils equal round and reactive to light and accomodation. No conjunctivitis or scleral icterus. ENT: Oropharynx clear without lesion. Upper dentures. Tongue normal. Mucous membranes moist.  RESPIRATORY: Clear to auscultation without rales, wheezes or rhonchi. CARDIOVASCULAR: Regular rate and rhythm without murmur, rub or gallop. ABDOMEN: Soft, non-tender, with active bowel sounds, and no hepatomegaly. Spleen palpable to umbilicus. No masses. SKIN: No rashes, ulcers or lesions. EXTREMITIES: No significant stigmata of rheumatoid arthritis.  No edema, no skin discoloration or tenderness. No palpable cords. LYMPH NODES: No palpable cervical, supraclavicular, axillary or inguinal adenopathy  NEUROLOGICAL: Unremarkable. PSYCH: Appropriate.   Results for orders placed or performed during the hospital encounter of 07/23/15 (from the past 48 hour(s))  Hepatitis B surface antigen     Status: None   Collection Time: 07/25/15  6:06 AM  Result Value Ref Range   Hepatitis B Surface Ag Negative Negative    Comment: (NOTE) Performed At: [facility] [address] [place], [place] [id number] Lindon Romp MD [id number]   Hepatitis B core antibody, total     Status: None   Collection Time: 07/25/15  6:06 AM  Result Value Ref Range   Hep B Core Total Ab Negative Negative    Comment: (NOTE) Performed At: [facility] [address] [place], [place] [id number] Darrel Hoover  F MD TZ:[id number]   Hepatitis C antibody     Status: None   Collection Time: 07/25/15  6:06 AM  Result Value Ref  Range   HCV Ab <0.1 0.0 - 0.9 s/co ratio    Comment: (NOTE)                                  Negative:     < 0.8                             Indeterminate: 0.8 - 0.9                                  Positive:     > 0.9 The CDC recommends that a positive HCV antibody result be followed up with a HCV Nucleic Acid Amplification test ([id number]). Performed At: [facility] [place], [place] [id number] Lindon Romp MD [id number]   Vitamin B12     Status: None   Collection Time: 07/25/15  6:06 AM  Result Value Ref Range   Vitamin B-12 440 180 - 914 pg/mL    Comment: (NOTE) This assay is not validated for testing neonatal or myeloproliferative syndrome specimens for Vitamin B12 levels. Performed at [facility]   Folate     Status: None   Collection Time: 07/25/15  6:06 AM  Result Value Ref Range   Folate 8.1 >5.9 ng/mL  CBC with Differential     Status: Abnormal   Collection Time: 07/25/15  6:06 AM  Result Value Ref Range   WBC 0.8 (LL) 3.8 - 10.6 K/uL    Comment: CRITICAL VALUE NOTED.  VALUE IS CONSISTENT WITH PREVIOUSLY REPORTED AND CALLED VALUE.   RBC 3.82 (L) 4.40 - 5.90 MIL/uL   Hemoglobin 8.6 (L) 13.0 - 18.0 g/dL   HCT 26.4 (L) 40.0 - 52.0 %   MCV 69.1 (L) 80.0 - 100.0 fL   MCH 22.6 (L) 26.0 - 34.0 pg   MCHC 32.7 32.0 - 36.0 g/dL   RDW 23.5 (H) 11.5 - 14.5 %   Platelets 78 (L) 150 - 440 K/uL   Neutrophils Relative % 20 %   Lymphocytes Relative 56 %   Monocytes Relative 20 %   Eosinophils Relative 3 %   Basophils Relative 1 %   Neutro Abs 0.2 (L) 1.4 - 6.5 K/uL   Lymphs Abs 0.4 (L) 1.0 - 3.6 K/uL   Monocytes Absolute 0.2 0.2 - 1.0 K/uL   Eosinophils Absolute 0.0 0 - 0.7 K/uL   Basophils Absolute 0.0 0 - 0.1 K/uL   Smear Review MORPHOLOGY UNREMARKABLE   Ferritin     Status: None   Collection Time: 07/25/15  6:06 AM  Result Value Ref Range   Ferritin 267 24 - 336 ng/mL  Iron and TIBC     Status: Abnormal   Collection Time: 07/25/15   6:06 AM  Result Value Ref Range   Iron 68 45 - 182 ug/dL   TIBC 185 (L) 250 - 450 ug/dL   Saturation Ratios 37 17.9 - 39.5 %   UIBC 117 ug/dL  Sedimentation rate     Status: Abnormal   Collection Time: 07/25/15  6:06 AM  Result Value Ref Range   Sed Rate 128 (H) 0 - 20 mm/hr  Osmolality,  urine     Status: Abnormal   Collection Time: 07/25/15 12:50 PM  Result Value Ref Range   Osmolality, Ur 215 (L) 300 - 900 mOsm/kg  Urinalysis complete, with microscopic ([facility] only)     Status: Abnormal   Collection Time: 07/25/15 12:50 PM  Result Value Ref Range   Color, Urine YELLOW (A) YELLOW   APPearance CLEAR (A) CLEAR   Glucose, UA NEGATIVE NEGATIVE mg/dL   Bilirubin Urine NEGATIVE NEGATIVE   Ketones, ur NEGATIVE NEGATIVE mg/dL   Specific Gravity, Urine 1.005 1.005 - 1.030   Hgb urine dipstick NEGATIVE NEGATIVE   pH 6.0 5.0 - 8.0   Protein, ur NEGATIVE NEGATIVE mg/dL   Nitrite NEGATIVE NEGATIVE   Leukocytes, UA NEGATIVE NEGATIVE   RBC / HPF NONE SEEN 0 - 5 RBC/hpf   WBC, UA 0-5 0 - 5 WBC/hpf   Bacteria, UA RARE (A) NONE SEEN   Squamous Epithelial / LPF NONE SEEN NONE SEEN  ANA w/Reflex if Positive     Status: None   Collection Time: 07/25/15  2:54 PM  Result Value Ref Range   Anit Nuclear Antibody(ANA) Negative Negative    Comment: (NOTE) Performed At: [facility] [place], [place] [id number] Lindon Romp MD WG:[id number]   Hepatic function panel     Status: Abnormal   Collection Time: 07/25/15  2:54 PM  Result Value Ref Range   Total Protein 7.4 6.5 - 8.1 g/dL   Albumin 2.0 (L) 3.5 - 5.0 g/dL   AST 24 15 - 41 U/L   ALT 11 (L) 17 - 63 U/L   Alkaline Phosphatase 48 38 - 126 U/L   Total Bilirubin 0.8 0.3 - 1.2 mg/dL   Bilirubin, Direct 0.1 0.1 - 0.5 mg/dL   Indirect Bilirubin 0.7 0.3 - 0.9 mg/dL  Occult blood card to lab, stool RN will collect     Status: None   Collection Time: 07/25/15  5:38 PM  Result Value Ref Range   Fecal Occult Bld NEGATIVE  NEGATIVE  CBC with Differential     Status: Abnormal   Collection Time: 07/26/15  3:31 AM  Result Value Ref Range   WBC 0.5 (LL) 3.8 - 10.6 K/uL    Comment: CRITICAL VALUE NOTED.  VALUE IS CONSISTENT WITH PREVIOUSLY REPORTED AND CALLED VALUE. RESULT REPEATED AND VERIFIED    RBC 3.69 (L) 4.40 - 5.90 MIL/uL   Hemoglobin 8.3 (L) 13.0 - 18.0 g/dL   HCT 25.8 (L) 40.0 - 52.0 %   MCV 69.8 (L) 80.0 - 100.0 fL   MCH 22.4 (L) 26.0 - 34.0 pg   MCHC 32.1 32.0 - 36.0 g/dL   RDW 23.3 (H) 11.5 - 14.5 %   Platelets 75 (L) 150 - 440 K/uL   Neutrophils Relative % 11% %   Neutro Abs 0.1 (L) 1.4 - 6.5 K/uL   Lymphocytes Relative 65% %   Lymphs Abs 0.3 (L) 1.0 - 3.6 K/uL   Monocytes Relative 21% %   Monocytes Absolute 0.1 (L) 0.2 - 1.0 K/uL   Eosinophils Relative 2% %   Eosinophils Absolute 0.0 0 - 0.7 K/uL   Basophils Relative 1% %   Basophils Absolute 0.0 0 - 0.1 K/uL  Sodium     Status: None   Collection Time: 07/26/15  3:31 AM  Result Value Ref Range   Sodium 136 135 - 145 mmol/L  Potassium     Status: None   Collection Time: 07/26/15  3:31 AM  Result Value Ref Range   Potassium 4.4 3.5 - 5.1 mmol/L   Ct Abdomen Pelvis W Contrast  07/26/2015  CLINICAL DATA:  55 year old Caucasian male with past medical history significant for history of rheumatid arthritis, Felty syndrome, pancytopenia, hepatosplenomegaly, who presents to the hospital with worsening hoarseness, cough. In emergency room, he was noted to have significant pancytopenia and admitted to the hospital for further evaluation and treatment. No surgical hx EXAM: CT ABDOMEN AND PELVIS WITH CONTRAST TECHNIQUE: Multidetector CT imaging of the abdomen and pelvis was performed using the standard protocol following bolus administration of intravenous contrast. CONTRAST:  181m ISOVUE-300 IOPAMIDOL (ISOVUE-300) INJECTION 61% COMPARISON:  06/14/2014 FINDINGS: Lung bases: There is new peribronchovascular opacity in the right middle lobe may be due to  atelectasis. Infection is possible. Remainder of the lung bases is clear. Heart is normal in size. Hepatobiliary:  Unremarkable. Spleen: Marked splenomegaly. Spleen currently measures 30 cm x 12 cm x 23 cm, previously 22 cm x 8.7 cm x 16 cm. There are no splenic masses or lesions. Pancreas and adrenal glands:  Normal. Kidneys, ureters, bladder: Left kidney is mildly displaced inferiorly and medially by the enlarged spleen. No renal masses or stones. No hydronephrosis. Normal ureters. Normal bladder. The lymph nodes: No enlarged lymph nodes. Ascites:  None. Vascular: Mild dilation of the infrarenal abdominal aorta to 2.6 cm. Mild atherosclerotic calcifications noted along the abdominal aorta and iliac vessels. Gastrointestinal: Bowel a and stomach are displaced by the enlarged spleen. Bowel is otherwise unremarkable. Normal appendix is visualized. Abdominal wall: There is right inguinal hernia. A loop of small bowel enters the hernia without evidence of obstruction, strangulation or incarceration. There is a small fat containing umbilical hernia. Musculoskeletal: Mild disc degenerative changes noted throughout the visualized spine. No osteoblastic or osteolytic lesions. IMPRESSION: 1. Area peribronchovascular opacity in the central right middle lobe is new from the prior CT. Bronchopneumonia is suspected given the history of cough. 2. No other acute findings. 3. Marked splenomegaly with a significant increase in the size from the prior CT as detailed above. 4. Small right inguinal hernia containing a loop of small bowel without evidence of obstruction, incarceration or strangulation. Small fat containing umbilical hernia. Mild degenerative changes noted along the visualized spine. 5. Small infrarenal abdominal aortic aneurysm, now measuring 2.6 cm, increased from 2.4 cm previously. Ectatic abdominal aorta at risk for aneurysm development. Recommend followup by ultrasound in 5 years. This recommendation follows ACR  consensus guidelines: White Paper of the ACR Incidental Findings Committee II on Vascular Findings. J Am Coll Radiol 2013; 10:789-794. Electronically Signed   By: DLajean ManesM.D.   On: 07/26/2015 16:46   Dg Hand 2 View Right  07/25/2015  CLINICAL DATA:  Pain and swelling.  History of rheumatoid arthritis. EXAM: LEFT HAND - 2 VIEW; RIGHT HAND - 2 VIEW COMPARISON:  None. FINDINGS: Moderate periarticular osteopenia bilaterally. No significant degenerative changes involving the DIP or PIP joints of the fingers. The metacarpophalangeal joints are maintained. No joint space narrowing or erosive changes. Minimal degenerative changes at the radiocarpal joint. The intercarpal joint spaces are maintained. No erosive findings or chondrocalcinosis. IMPRESSION: Moderate periarticular osteopenia but no significant degenerative changes or erosive findings. Electronically Signed   By: PMarijo SanesM.D.   On: 07/25/2015 15:45   Dg Hand 2 View Left  07/25/2015  CLINICAL DATA:  Pain and swelling.  History of rheumatoid arthritis. EXAM: LEFT HAND - 2 VIEW; RIGHT HAND - 2 VIEW COMPARISON:  None. FINDINGS:  Moderate periarticular osteopenia bilaterally. No significant degenerative changes involving the DIP or PIP joints of the fingers. The metacarpophalangeal joints are maintained. No joint space narrowing or erosive changes. Minimal degenerative changes at the radiocarpal joint. The intercarpal joint spaces are maintained. No erosive findings or chondrocalcinosis. IMPRESSION: Moderate periarticular osteopenia but no significant degenerative changes or erosive findings. Electronically Signed   By: Marijo Sanes M.D.   On: 07/25/2015 15:45    Assessment:  Marcus [name] is a 55 y.o. 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 He has lost 24 pounds in the past 4 months. He has been unresponsive to methotrexate. He is a candidate  for outpatient Rituxan.  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 He has a microcytic anemia likely due to poor nutrition. He denies any melena or hematochezia.  Plan: 1.  Hematology:  Discussed with patient diagnosis of Felty's at [facility].   Discuss re-evaluation given concerns raised by Rheumatology.  Discussed bone marrow aspirate and biopsy with re-evaluation of pancytopenia.  Will rule out leukemia and lymphoma.  Exclude LGL leukemia.  Patient has consented to HIV testing.  Abdominal and pelvic CT scan today reveal massive splenomegaly (30 cm), no ascites and no adenopathy.  No evidence of cirrhosis.  Patient has massive splenomegaly.  Splenectomy has been used in the past for Felty's patients with recurrent infections as it can increase the blood counts.  Would consider in the future if unresponsive to additional therapy.    Anemia work-up reveals a normal  B12, folate, ferritin (although may be falsely elevated secondary to the elevated ESR).  Iron studies suggests chronic disease.  Will check reticulocyte count.  Stools guaiac negative.  Hepatitis serologies (B and C) are negative.  2.  Infectious disease:  Chest CT indicates right middle lobe pneumonia.  Patient on azithromycin and ceftriaxone.  Afebrile.  Patient with chronic neutropenia.  Consider ID consult.   Lequita Asal, MD  07/26/2015

## 2015-07-27 NOTE — Progress Notes (Signed)
Pharmacy Antibiotic Note  Marcus [name] is a 55 y.o. male admitted on 07/23/2015 with pneumonia.  Pharmacy has been consulted for ceftriaxone dosing.  Patient is currently on day #5 of antibiotics with ceftriaxone 1 g IV daily and azithromycin 500 mg IV daily. Patient has pancytopenia with a WBC of 0.6 and ANC of 100 today.  Plan: Continue current dose of ceftriaxone 1 g IV daily  Height: 5\' 10"  (177.8 cm) Weight: 161 lb 9.6 oz (73.301 kg) IBW/kg (Calculated) : 73  Temp (24hrs), Avg:98.2 F (36.8 C), Min:97.8 F (36.6 C), Max:98.4 F (36.9 C)   Recent Labs Lab 07/23/15 1514 07/24/15 0525 07/25/15 0112 07/25/15 0606 07/26/15 0331 07/27/15 0541  WBC 0.4* 0.6* 0.6* 0.8* 0.5* 0.6*  CREATININE 0.81 0.61  --   --   --   --     Estimated Creatinine Clearance: 109 mL/min (by C-G formula based on Cr of 0.61).    No Known Allergies  Antimicrobials this admission: ceftriaxone 4/17 >>  azithromycin 4/17 >>   Dose adjustments this admission: n/a  Microbiology results: 4/17 BCx: No growth 4 days  Thank you for allowing pharmacy to be a part of this patient's care.  Lenis Noon, PharmD Clinical Pharmacist 07/27/2015 12:03 PM

## 2015-07-27 NOTE — Consult Note (Signed)
Chief Complaint: Pancytopenia request made for CT-guided bone marrow biopsy and aspiration.  Referring Physician(s): Corcoran  History of Present Illness: Marcus [name] is a 55 y.o. male with past medical history significant for Felty syndrome (rheumatoid arthritis, splenomegaly and neutropenia) who was admitted with bronchitis, mouth sores and decreased oral intake who was noted to be pancytopenic and as quite such, request made for CT-guided bone marrow biopsy and aspiration.  Of note, this patient had this similar procedure performed approximately 2 years ago at [facility]. pancytopenia presents today for CT-guided bone marrow biopsy.   Past Medical History  Diagnosis Date  . 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 Pancytopenia ([place])   . Seropositive rheumatoid arthritis ([place])   . Tobacco use disorder     History reviewed. No pertinent past surgical history.  Allergies: Review of patient's allergies indicates no known allergies.  Medications: Prior to Admission medications   Medication Sig Start Date End Date Taking? Authorizing Provider  acetaminophen (TYLENOL) 500 MG tablet Take 500 mg by mouth every 6 (six) hours as needed for mild pain or headache.   Yes Historical Provider, MD  predniSONE (DELTASONE) 10 MG tablet Take 10 mg by mouth daily with breakfast.   Yes Historical Provider, MD     Family History  Problem Relation Age of Onset  . CAD Brother   . COPD Brother     Social History   Social History  . Marital Status: Married    Spouse Name: N/A  . Number of Children: N/A  . Years of Education: N/A   Social History Main Topics  . Smoking status: Current Every Day Smoker -- 1.00 packs/day    Types: Cigarettes  . Smokeless tobacco: None  . Alcohol Use: No  . Drug Use: Yes    Special: Marijuana  . Sexual Activity: Not Asked   Other Topics Concern  . None   Social History Narrative   Lives at home with Mother. Ambulates independently.    ECOG Status: 1 -  Symptomatic but completely ambulatory  Review of Systems: A 12 point ROS discussed and pertinent positives are indicated in the HPI above.  All other systems are negative.  Review of Systems  Vital Signs: BP 133/80 mmHg  Pulse 87  Temp(Src) 98.1 F (36.7 C) (Oral)  Resp 18  Ht _0  (1.778 m)  Wt 161 lb 9.6 oz (73.301 kg)  BMI 23.19 kg/m2  SpO2 97%  Physical Exam  Constitutional: He appears well-developed and well-nourished.  Cardiovascular: Normal rate and regular rhythm.   Pulmonary/Chest: Effort normal.  Crackles noted within the bilateral lung bases.    Mallampati Score:     Imaging: Dg Chest 2 View  07/23/2015  CLINICAL DATA:  Chest pain and shortness of breath. Productive cough. EXAM: CHEST  2 VIEW COMPARISON:  06/14/2014 and 10/21/2012 FINDINGS: Heart size and pulmonary vascularity are normal. Slight peribronchial thickening. No infiltrates or effusions. Minimal scarring at the right base laterally. No bone abnormality. IMPRESSION: Slight bronchitic changes. Electronically Signed   By: Lorriane Shire M.D.   On: 07/23/2015 15:36   Ct Abdomen Pelvis W Contrast  07/26/2015  CLINICAL DATA:  55 year old Caucasian male with past medical history significant for history of rheumatid arthritis, Felty syndrome, pancytopenia, hepatosplenomegaly, who presents to the hospital with worsening hoarseness, cough. In emergency room, he was noted to have significant pancytopenia and admitted to the hospital for further evaluation and treatment. No surgical hx EXAM: CT ABDOMEN AND PELVIS WITH  CONTRAST TECHNIQUE: Multidetector CT imaging of the abdomen and pelvis was performed using the standard protocol following bolus administration of intravenous contrast. CONTRAST:  113m ISOVUE-300 IOPAMIDOL (ISOVUE-300) INJECTION 61% COMPARISON:  06/14/2014 FINDINGS: Lung bases: There is new peribronchovascular opacity in the right middle lobe may be due to atelectasis. Infection is possible. Remainder of  the lung bases is clear. Heart is normal in size. Hepatobiliary:  Unremarkable. Spleen: Marked splenomegaly. Spleen currently measures 30 cm x 12 cm x 23 cm, previously 22 cm x 8.7 cm x 16 cm. There are no splenic masses or lesions. Pancreas and adrenal glands:  Normal. Kidneys, ureters, bladder: Left kidney is mildly displaced inferiorly and medially by the enlarged spleen. No renal masses or stones. No hydronephrosis. Normal ureters. Normal bladder. The lymph nodes: No enlarged lymph nodes. Ascites:  None. Vascular: Mild dilation of the infrarenal abdominal aorta to 2.6 cm. Mild atherosclerotic calcifications noted along the abdominal aorta and iliac vessels. Gastrointestinal: Bowel a and stomach are displaced by the enlarged spleen. Bowel is otherwise unremarkable. Normal appendix is visualized. Abdominal wall: There is right inguinal hernia. A loop of small bowel enters the hernia without evidence of obstruction, strangulation or incarceration. There is a small fat containing umbilical hernia. Musculoskeletal: Mild disc degenerative changes noted throughout the visualized spine. No osteoblastic or osteolytic lesions. IMPRESSION: 1. Area peribronchovascular opacity in the central right middle lobe is new from the prior CT. Bronchopneumonia is suspected given the history of cough. 2. No other acute findings. 3. Marked splenomegaly with a significant increase in the size from the prior CT as detailed above. 4. Small right inguinal hernia containing a loop of small bowel without evidence of obstruction, incarceration or strangulation. Small fat containing umbilical hernia. Mild degenerative changes noted along the visualized spine. 5. Small infrarenal abdominal aortic aneurysm, now measuring 2.6 cm, increased from 2.4 cm previously. Ectatic abdominal aorta at risk for aneurysm development. Recommend followup by ultrasound in 5 years. This recommendation follows ACR consensus guidelines: White Paper of the ACR  Incidental Findings Committee II on Vascular Findings. J Am Coll Radiol 2013; 10:789-794. Electronically Signed   By: DLajean ManesM.D.   On: 07/26/2015 16:46   Dg Hand 2 View Right  07/25/2015  CLINICAL DATA:  Pain and swelling.  History of rheumatoid arthritis. EXAM: LEFT HAND - 2 VIEW; RIGHT HAND - 2 VIEW COMPARISON:  None. FINDINGS: Moderate periarticular osteopenia bilaterally. No significant degenerative changes involving the DIP or PIP joints of the fingers. The metacarpophalangeal joints are maintained. No joint space narrowing or erosive changes. Minimal degenerative changes at the radiocarpal joint. The intercarpal joint spaces are maintained. No erosive findings or chondrocalcinosis. IMPRESSION: Moderate periarticular osteopenia but no significant degenerative changes or erosive findings. Electronically Signed   By: PMarijo SanesM.D.   On: 07/25/2015 15:45   Dg Hand 2 View Left  07/25/2015  CLINICAL DATA:  Pain and swelling.  History of rheumatoid arthritis. EXAM: LEFT HAND - 2 VIEW; RIGHT HAND - 2 VIEW COMPARISON:  None. FINDINGS: Moderate periarticular osteopenia bilaterally. No significant degenerative changes involving the DIP or PIP joints of the fingers. The metacarpophalangeal joints are maintained. No joint space narrowing or erosive changes. Minimal degenerative changes at the radiocarpal joint. The intercarpal joint spaces are maintained. No erosive findings or chondrocalcinosis. IMPRESSION: Moderate periarticular osteopenia but no significant degenerative changes or erosive findings. Electronically Signed   By: PMarijo SanesM.D.   On: 07/25/2015 15:45    Labs:  CBC:  Recent Labs  07/25/15 0112 07/25/15 0606 07/26/15 0331 07/27/15 0541  WBC 0.6* 0.8* 0.5* 0.6*  HGB 8.5* 8.6* 8.3* 8.6*  HCT 26.2* 26.4* 25.8* 26.2*  PLT 71* 78* 75* 84*    COAGS: No results for input(s): INR, APTT in the last 8760 hours.  BMP:  Recent Labs  07/23/15 1514 07/24/15 0525  07/25/15 0112 07/26/15 0331  NA 130* 133* 131* 136  K 3.9 3.9  --  4.4  CL 100* 104  --   --   CO2 22 25  --   --   GLUCOSE 137* 80  --   --   BUN 10 8  --   --   CALCIUM 7.9* 7.5*  --   --   CREATININE 0.81 0.61  --   --   GFRNONAA >60 >60  --   --   GFRAA >60 >60  --   --     LIVER FUNCTION TESTS:  Recent Labs  07/25/15 1454  BILITOT 0.8  AST 24  ALT 11*  ALKPHOS 48  PROT 7.4  ALBUMIN 2.0*    TUMOR MARKERS: No results for input(s): AFPTM, CEA, CA199, CHROMGRNA in the last 8760 hours.  Assessment and Plan:  Marcus [name] is a 55 y.o. male with past medical history significant for Felty syndrome (rheumatoid arthritis, splenomegaly and neutropenia) who was admitted with bronchitis, mouth sores and decreased oral intake who was noted to be pancytopenic and as quite such, request made for CT-guided bone marrow biopsy and aspiration.  Risks and Benefits a CT-guided bone marrow biopsy and aspiration discussed with the patient including, but not limited to bleeding, infection, damage to adjacent structures or low yield requiring additional tests.  All of the patient's questions were answered, patient is agreeable to proceed.  Consent signed and in chart.  Thank you for this interesting consult.  I greatly enjoyed meeting Marcus [name] and look forward to participating in his care.  A copy of this report was sent to the requesting provider on this date.  Electronically Signed: TEMESGEN, Marcus [name] 07/27/2015, 1:11 PM   I spent a total of 20 Minutes in face to face in clinical consultation, greater than 50% of which was counseling/coordinating care for CT-guided bone marrow biopsy and aspiration.

## 2015-07-27 NOTE — Consult Note (Signed)
Technically successful CT guided bone marrow aspiration and biopsy of left iliac crest. EBL: None No immediate complications.    SignedKAILUB, YOPP [phone] 07/27/2015, 1:52 PM

## 2015-07-28 LAB — CBC WITH DIFFERENTIAL/PLATELET
Basophils Absolute: 0 10*3/uL (ref 0–0.1)
Basophils Relative: 1 %
Eosinophils Absolute: 0 10*3/uL (ref 0–0.7)
Eosinophils Relative: 2 %
HCT: 25.4 % — ABNORMAL LOW (ref 40.0–52.0)
Hemoglobin: 8.2 g/dL — ABNORMAL LOW (ref 13.0–18.0)
Lymphocytes Relative: 64 %
Lymphs Abs: 0.4 10*3/uL — ABNORMAL LOW (ref 1.0–3.6)
MCH: 22.8 pg — ABNORMAL LOW (ref 26.0–34.0)
MCHC: 32.4 g/dL (ref 32.0–36.0)
MCV: 70.3 fL — ABNORMAL LOW (ref 80.0–100.0)
Monocytes Absolute: 0.1 10*3/uL — ABNORMAL LOW (ref 0.2–1.0)
Monocytes Relative: 22 %
Neutro Abs: 0.1 10*3/uL — ABNORMAL LOW (ref 1.4–6.5)
Neutrophils Relative %: 11 %
Platelets: 79 10*3/uL — ABNORMAL LOW (ref 150–440)
RBC: 3.62 MIL/uL — ABNORMAL LOW (ref 4.40–5.90)
RDW: 24.3 % — ABNORMAL HIGH (ref 11.5–14.5)
WBC: 0.6 10*3/uL — CL (ref 3.8–10.6)

## 2015-07-28 LAB — CULTURE, BLOOD (ROUTINE X 2)
Culture: NO GROWTH
Culture: NO GROWTH

## 2015-07-28 LAB — HIV ANTIBODY (ROUTINE TESTING W REFLEX): HIV Screen 4th Generation wRfx: NONREACTIVE

## 2015-07-28 MED ORDER — DOXYCYCLINE HYCLATE 100 MG PO CAPS: 100.0000 mg | ORAL_CAPSULE | Freq: Two times a day (BID) | ORAL | Status: DC

## 2015-07-28 MED ORDER — LEVOFLOXACIN 500 MG PO TABS: 500.0000 mg | ORAL_TABLET | Freq: Every day | ORAL | Status: DC

## 2015-07-28 MED ORDER — PREDNISONE 10 MG PO TABS: ORAL_TABLET | ORAL | Status: DC

## 2015-07-28 NOTE — Plan of Care (Signed)
Pt d/ced home - will f/u outpt w/Dr. Corcorran re: bone marrow biopsy.  Pt has allergy to levaquin and dr was putting him on that to go home.  [employer] nursing student caught it when she was reviewing d/c instructions.  Pt also went home on pred taper.  Left for home w/his brother.  Pt currently lives w/54 yr old mother.  

## 2015-07-28 NOTE — Discharge Summary (Signed)
[facility] [place] NAME: Marcus [name]    MR#:  [id number]  DATE OF BIRTH:  04/14/1960  DATE OF ADMISSION:  07/23/2015 ADMITTING PHYSICIAN: Gladstone Lighter, MD  DATE OF DISCHARGE: 07/28/2015  PRIMARY CARE PHYSICIAN: No PCP Per Patient    ADMISSION DIAGNOSIS:  breathing difficulties  DISCHARGE DIAGNOSIS:  Active Problems:   Pancytopenia (H[facility])   Protein-calorie malnutrition, severe   SECONDARY DIAGNOSIS:   Past Medical History  Diagnosis Date  . 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 Pancytopenia ([place])   . Seropositive rheumatoid arthritis ([place])   . Tobacco use disorder     HOSPITAL COURSE:   55 year old male with past medical history of rheumatoid arthritis, pancytopenia, Felty's syndrome with hepatosplenomegaly presents to the hospital with shortness of breath and cough and noted to have pneumonia.  1. Pneumonia-patient was treated with IV ceftriaxone and Zithromax in the hospital -He has improved and is no longer hypoxic, he is afebrile, hemodynamically stable. He is being discharged on oral doxycycline.  2 pancytopenia-etiology unclear. Thought to be related to his underlying Felty's syndrome but as per rheumatology patient needs hematologic workup. Patient is status post bone marrow biopsy and the results of which are still pending. -Patient has been transfused and his hemoglobin is improved and stable without evidence of acute bleeding he still remains leukopenic but is afebrile and stable he is being discharged on oral antibiotics as mentioned above and follow-up with his hematologist oncologist as an outpatient.  3. Hyponatremia-she was given some IV fluids and his sodium is since improved and is normalized now.  4. Hoarseness-patient does have a hoarse voice and was seen by ENT who evaluated him and did not recommend any acute intervention but treatment for reflux. -He will need further follow up as outpatient.   DISCHARGE CONDITIONS:   Stable  CONSULTS OBTAINED:  Treatment Team:  Lequita Asal, MD Emmaline Kluver., MD Carloyn Manner, MD  DRUG ALLERGIES:   Allergies  Allergen Reactions  . Levaquin [Levofloxacin In D5w] Other (See Comments)    Chest pain    DISCHARGE MEDICATIONS:   Current Discharge Medication List    START taking these medications   Details  doxycycline (VIBRAMYCIN) 100 MG capsule Take 1 capsule (100 mg total) by mouth 2 (two) times daily. Qty: 14 capsule, Refills: 0      CONTINUE these medications which have CHANGED   Details  predniSONE (DELTASONE) 10 MG tablet Label  & dispense according to the schedule below. 3 Pills PO for 1 day then, 2 Pills PO for 1 day, 1 Pills PO for 1 day then STOP. Qty: 6 tablet, Refills: 0      CONTINUE these medications which have NOT CHANGED   Details  acetaminophen (TYLENOL) 500 MG tablet Take 500 mg by mouth every 6 (six) hours as needed for mild pain or headache.         DISCHARGE INSTRUCTIONS:   DIET:  Regular diet  DISCHARGE CONDITION:  Stable  ACTIVITY:  Activity as tolerated  OXYGEN:  Home Oxygen: No.   Oxygen Delivery: room air  DISCHARGE LOCATION:  home   If you experience worsening of your admission symptoms, develop shortness of breath, life threatening emergency, suicidal or homicidal thoughts you must seek medical attention immediately by calling 911 or calling your MD immediately  if symptoms less severe.  You Must read complete instructions/literature along with all the possible adverse reactions/side effects for all the Medicines you take  and that have been prescribed to you. Take any new Medicines after you have completely understood and accpet all the possible adverse reactions/side effects.   Please note  You were cared for by a hospitalist during your hospital stay. If you have any questions about your discharge medications or the care you received while you were in the hospital after you are discharged,  you can call the unit and asked to speak with the hospitalist on call if the hospitalist that took care of you is not available. Once you are discharged, your primary care physician will handle any further medical issues. Please note that NO REFILLS for any discharge medications will be authorized once you are discharged, as it is imperative that you return to your primary care physician (or establish a relationship with a primary care physician if you do not have one) for your aftercare needs so that they can reassess your need for medications and monitor your lab values.     Today   No shortness of breath, fever. Positive cough but nonproductive. Feels better and wants to go home.  VITAL SIGNS:  Blood pressure 114/58, pulse 95, temperature 97.9 F (36.6 C), temperature source Oral, resp. rate 19, height 5\' 10"  (1.778 m), weight 73.301 kg (161 lb 9.6 oz), SpO2 98 %.  I/O:   Intake/Output Summary (Last 24 hours) at 07/28/15 1442 Last data filed at 07/28/15 1330  Gross per 24 hour  Intake    480 ml  Output   2425 ml  Net  -1945 ml    PHYSICAL EXAMINATION:  GENERAL:  55 y.o.-year-old patient lying in the bed with no acute distress.  EYES: Pupils equal, round, reactive to light and accommodation. No scleral icterus. Extraocular muscles intact.  HEENT: Head atraumatic, normocephalic. Oropharynx and nasopharynx clear.  NECK:  Supple, no jugular venous distention. No thyroid enlargement, no tenderness.  LUNGS: Normal breath sounds bilaterally, no wheezing, rales,rhonchi. No use of accessory muscles of respiration.  CARDIOVASCULAR: S1, S2 normal. No murmurs, rubs, or gallops.  ABDOMEN: Soft, non-tender, non-distended. Bowel sounds present. No organomegaly or mass.  EXTREMITIES: No pedal edema, cyanosis, or clubbing.  NEUROLOGIC: Cranial nerves II through XII are intact. No focal motor or sensory defecits b/l.  PSYCHIATRIC: The patient is alert and oriented x 3. Good affect.  SKIN: No  obvious rash, lesion, or ulcer.   DATA REVIEW:   CBC  Recent Labs Lab 07/28/15 0433  WBC 0.6*  HGB 8.2*  HCT 25.4*  PLT 79*    Chemistries   Recent Labs Lab 07/24/15 0525  07/25/15 1454 07/26/15 0331  NA 133*  < >  --  136  K 3.9  --   --  4.4  CL 104  --   --   --   CO2 25  --   --   --   GLUCOSE 80  --   --   --   BUN 8  --   --   --   CREATININE 0.61  --   --   --   CALCIUM 7.5*  --   --   --   AST  --   --  24  --   ALT  --   --  11*  --   ALKPHOS  --   --  48  --   BILITOT  --   --  0.8  --   < > = values in this interval not displayed.  Cardiac Enzymes  Recent  Labs Lab 07/23/15 1514  [place] <0.03    Microbiology Results  Results for orders placed or performed during the hospital encounter of 07/23/15  Culture, blood (Routine X 2) w Reflex to ID Panel     Status: None (Preliminary result)   Collection Time: 07/23/15  4:22 PM  Result Value Ref Range Status   Specimen Description BLOOD LEFT ASSIST CONTROL  Final   Special Requests   Final    BOTTLES DRAWN AEROBIC AND ANAEROBIC 8CC AERO [place] ANA   Culture NO GROWTH 4 DAYS  Final   Report Status PENDING  Incomplete  Culture, blood (Routine X 2) w Reflex to ID Panel     Status: None (Preliminary result)   Collection Time: 07/23/15  4:23 PM  Result Value Ref Range Status   Specimen Description BLOOD LEFT WRIST  Final   Special Requests   Final    BOTTLES DRAWN AEROBIC AND ANAEROBIC 8CC AERO 6CC ANA   Culture NO GROWTH 4 DAYS  Final   Report Status PENDING  Incomplete    RADIOLOGY:  Ct Abdomen Pelvis W Contrast  07/26/2015  CLINICAL DATA:  55 year old Caucasian male with past medical history significant for history of rheumatid arthritis, Felty syndrome, pancytopenia, hepatosplenomegaly, who presents to the hospital with worsening hoarseness, cough. In emergency room, he was noted to have significant pancytopenia and admitted to the hospital for further evaluation and treatment. No surgical hx EXAM:  CT ABDOMEN AND PELVIS WITH CONTRAST TECHNIQUE: Multidetector CT imaging of the abdomen and pelvis was performed using the standard protocol following bolus administration of intravenous contrast. CONTRAST:  172m ISOVUE-300 IOPAMIDOL (ISOVUE-300) INJECTION 61% COMPARISON:  06/14/2014 FINDINGS: Lung bases: There is new peribronchovascular opacity in the right middle lobe may be due to atelectasis. Infection is possible. Remainder of the lung bases is clear. Heart is normal in size. Hepatobiliary:  Unremarkable. Spleen: Marked splenomegaly. Spleen currently measures 30 cm x 12 cm x 23 cm, previously 22 cm x 8.7 cm x 16 cm. There are no splenic masses or lesions. Pancreas and adrenal glands:  Normal. Kidneys, ureters, bladder: Left kidney is mildly displaced inferiorly and medially by the enlarged spleen. No renal masses or stones. No hydronephrosis. Normal ureters. Normal bladder. The lymph nodes: No enlarged lymph nodes. Ascites:  None. Vascular: Mild dilation of the infrarenal abdominal aorta to 2.6 cm. Mild atherosclerotic calcifications noted along the abdominal aorta and iliac vessels. Gastrointestinal: Bowel a and stomach are displaced by the enlarged spleen. Bowel is otherwise unremarkable. Normal appendix is visualized. Abdominal wall: There is right inguinal hernia. A loop of small bowel enters the hernia without evidence of obstruction, strangulation or incarceration. There is a small fat containing umbilical hernia. Musculoskeletal: Mild disc degenerative changes noted throughout the visualized spine. No osteoblastic or osteolytic lesions. IMPRESSION: 1. Area peribronchovascular opacity in the central right middle lobe is new from the prior CT. Bronchopneumonia is suspected given the history of cough. 2. No other acute findings. 3. Marked splenomegaly with a significant increase in the size from the prior CT as detailed above. 4. Small right inguinal hernia containing a loop of small bowel without evidence  of obstruction, incarceration or strangulation. Small fat containing umbilical hernia. Mild degenerative changes noted along the visualized spine. 5. Small infrarenal abdominal aortic aneurysm, now measuring 2.6 cm, increased from 2.4 cm previously. Ectatic abdominal aorta at risk for aneurysm development. Recommend followup by ultrasound in 5 years. This recommendation follows ACR consensus guidelines: White Paper of the ACR Incidental  Findings Committee II on Vascular Findings. J Am Coll Radiol 2013; 10:789-794. Electronically Signed   By: Lajean Manes M.D.   On: 07/26/2015 16:46   Ct Biopsy  07/27/2015  INDICATION: History of Felty syndrome and pancytopenia. Please perform CT-guided bone marrow biopsy for tissue diagnostic purposes. EXAM: CT-GUIDED BONE MARROW BIOPSY AND ASPIRATION MEDICATIONS: None ANESTHESIA/SEDATION: Fentanyl 50 mcg IV; Versed 2 mg IV Sedation Time: 10 minutes; The patient was continuously monitored during the procedure by the interventional radiology nurse under my direct supervision. COMPLICATIONS: None immediate. PROCEDURE: Informed consent was obtained from the patient following an explanation of the procedure, risks, benefits and alternatives. The patient understands, agrees and consents for the procedure. All questions were addressed. A time out was performed prior to the initiation of the procedure. The patient was positioned prone and non-contrast localization CT was performed of the pelvis to demonstrate the iliac marrow spaces. The operative site was prepped and draped in the usual sterile fashion. Under sterile conditions and local anesthesia, a 22 gauge spinal needle was utilized for procedural planning. Next, an 11 gauge coaxial bone biopsy needle was advanced into the left iliac marrow space. Needle position was confirmed with CT imaging. Initially, bone marrow aspiration was performed. Next, a bone marrow biopsy was obtained with the 11 gauge outer bone marrow device.  Samples were prepared with the cytotechnologist and deemed adequate. The needle was removed intact. Hemostasis was obtained with compression and a dressing was placed. The patient tolerated the procedure well without immediate post procedural complication. IMPRESSION: Successful CT guided left iliac bone marrow aspiration and core biopsy. Electronically Signed   By: Sandi Mariscal M.D.   On: 07/27/2015 14:10      Management plans discussed with the patient, family and they are in agreement.  CODE STATUS:     Code Status Orders        Start     Ordered   07/23/15 1924  Full code   Continuous     07/23/15 1923    Code Status History    Date Active Date Inactive Code Status Order ID Comments User Context   This patient has a current code status but no historical code status.      TOTAL TIME TAKING CARE OF THIS PATIENT: 40 minutes.    Henreitta Leber M.D on 07/28/2015 at 2:42 PM  Between 7am to 6pm - Pager - [phone]  After 6pm go to [email or web address] - password [email or web address] [facility]  [facility]  Office  [phone]  CC: Primary care physician; No PCP Per Patient

## 2015-08-06 ENCOUNTER — Inpatient Hospital Stay: Payer: Self-pay | Attending: Hematology and Oncology | Admitting: Hematology and Oncology

## 2015-08-06 VITALS — BP 122/77 | HR 105 | Temp 96.2°F | Resp 18 | Ht 70.0 in | Wt 163.9 lb

## 2015-08-06 DIAGNOSIS — R5383 Other fatigue: Secondary | ICD-10-CM | POA: Insufficient documentation

## 2015-08-06 DIAGNOSIS — F17210 Nicotine dependence, cigarettes, uncomplicated: Secondary | ICD-10-CM | POA: Insufficient documentation

## 2015-08-06 DIAGNOSIS — Z7952 Long term (current) use of systemic steroids: Secondary | ICD-10-CM | POA: Insufficient documentation

## 2015-08-06 DIAGNOSIS — M069 Rheumatoid arthritis, unspecified: Secondary | ICD-10-CM | POA: Insufficient documentation

## 2015-08-06 DIAGNOSIS — R110 Nausea: Secondary | ICD-10-CM | POA: Insufficient documentation

## 2015-08-06 DIAGNOSIS — M1990 Unspecified osteoarthritis, unspecified site: Secondary | ICD-10-CM | POA: Insufficient documentation

## 2015-08-06 DIAGNOSIS — D61818 Other pancytopenia: Secondary | ICD-10-CM | POA: Insufficient documentation

## 2015-08-06 DIAGNOSIS — R918 Other nonspecific abnormal finding of lung field: Secondary | ICD-10-CM | POA: Insufficient documentation

## 2015-08-06 DIAGNOSIS — E43 Unspecified severe protein-calorie malnutrition: Secondary | ICD-10-CM

## 2015-08-06 DIAGNOSIS — I714 Abdominal aortic aneurysm, without rupture: Secondary | ICD-10-CM | POA: Insufficient documentation

## 2015-08-06 DIAGNOSIS — M0500 Felty's syndrome, unspecified site: Secondary | ICD-10-CM | POA: Insufficient documentation

## 2015-08-06 DIAGNOSIS — Z79899 Other long term (current) drug therapy: Secondary | ICD-10-CM | POA: Insufficient documentation

## 2015-08-06 DIAGNOSIS — R630 Anorexia: Secondary | ICD-10-CM | POA: Insufficient documentation

## 2015-08-06 DIAGNOSIS — R161 Splenomegaly, not elsewhere classified: Secondary | ICD-10-CM | POA: Insufficient documentation

## 2015-08-06 DIAGNOSIS — R634 Abnormal weight loss: Secondary | ICD-10-CM | POA: Insufficient documentation

## 2015-08-06 DIAGNOSIS — M8580 Other specified disorders of bone density and structure, unspecified site: Secondary | ICD-10-CM | POA: Insufficient documentation

## 2015-08-06 NOTE — Progress Notes (Signed)
Bone marrow biopsy 4/21 per pt

## 2015-08-06 NOTE — Progress Notes (Signed)
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  HPI:  The patient was diagnosed with Felty's in 06/2013 at [facility]. He presented with fevers, diffuse joint pain, neutropenia, and splenomegaly. He underwent bone marrow which revealed a hypercellular bone marrow with mild polytypic plasmacytosis and normal karyotype which can be seen in autoimmune conditions. CCP and RF were found to be significantly elevated. ANA was > 1:640 but had negative dsDNA and ENA. He was subsequently followed by Dr. Sterling Big [facility] hematologist) and Dr Daryll Brod [facility] rheumatology). He was seen by me once during his [facility] hospitalization in 06/2014. He was lost to follow-up.  The patient was last seen by Dr. Santiago Bumpers, [facility] rheumatology, on 03/12/2015. At that time, he had no improvement in his rheumatoid arthritis or neutropenia with increasing doses of MTX. He was taking MTX 20 mg a week and prednisone 10 mg a day. Discussions were held about initiation of Rituxan. Leflunomide (Arava) was felt another option. He did not go back for follow-up as Marcus [name] is too far away.  He was admitted to [facility] from 07/23/2015 - 07/28/2015.  He presented with symptoms off hoarseness, mouth sores, and decreased oral intake. He felt like his mouth was on fire. Evaluation in the ER revealed a hematocrit of 21.4, hemoglobin 6.9, MCV 66.3, platelets 85,000, WBC 400. CXR revealed slight bronchitic changes. He was transfused with PRBCs. He was treated with azithromycin and ceftriaxone for bronchitis and discharged on doxycycline.  While hospitalized, he underwent additional testing.  HIV testing was negative.  Hepatitis B surface antigen and hepatitis B core antibody was negative.  Hepatitis C antibody was negative.  ANA was negative.   Reticulocyte count was 3.6%.  B12 (440) and folate (8.1) were normal.    Abdomen and pelvic CT scan on 07/26/2015 revealed an area peribronchovascular opacity in the central right middle lobe (new) suspicious for bronchopneumonia.  There was marked splenomegaly (30 cm x 12 cm x 23 cm, previously 22 cm x 8.7 cm x 16 cm).  There was a small right inguinal hernia containing a loop of small bowel without evidence of obstruction, incarceration or strangulation.  There was a small fat containing umbilical hernia.   There were mild degenerative changes.  There was a small infrarenal abdominal aortic aneurysm (2.6 cm, previously 2.4 cm).  Bone marrow aspirate and biopsy on 07/27/2015 revealed a hypercellular marrow for age (70-80%) with relative myeloid hyperplasia, mild multilineage dyspoiesis and no increased blasts.  There was mildly increased T-lymphocytes, nonspecific.  There was mild polytypic plasmocytosis.  There was mild to focally moderate increase in reticulin.  Storage iron was present.Flow cytometry revealed no significant myeloid/monocytic abnormalities or increase in blasts.  There was no monoclonality.  There was a relatively increased T-cell receptor gamma/delta+ T cells (9%), nonspecific.  He was seen by rheumatology, Dr. Precious Reel, while hospitalized on 07/25/2015.  Hand films revealed moderate periarticular osteopenia but no significant degenerative changes or erosive findings.  LDH was 196 (98-192).  Urine revealed no protein.  LFTs were normal.  Albumen was 2.0 (low).  Symptomatically, he notes waxing and waning mouth ulcers.  He has had no interval fevers or infections.   Past Medical History  Diagnosis Date  . 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 Pancytopenia ([place])   . Seropositive rheumatoid arthritis (  [place])   . Tobacco use disorder     No past surgical history on file.  Family History  Problem Relation Age of Onset  . CAD Brother   . COPD Brother     Social History:  reports that  he has been smoking Cigarettes.  He has been smoking about 1.00 pack per day. He does not have any smokeless tobacco history on file. He reports that he uses illicit drugs (Marijuana). He reports that he does not drink alcohol.  He lives with his mother.  He has no job or insurance.  He is accompanied by his mother today.  Allergies:  Allergies  Allergen Reactions  . Levaquin [Levofloxacin In D5w] Other (See Comments)    Chest pain  . Methotrexate Other (See Comments)    Chest pain    Current Medications: Current Outpatient Prescriptions  Medication Sig Dispense Refill  . acetaminophen (TYLENOL) 500 MG tablet Take 500 mg by mouth every 6 (six) hours as needed for mild pain or headache.    . clindamycin (CLEOCIN T) 1 % external solution Apply topically.    [name] doxycycline (VIBRAMYCIN) 100 MG capsule Take 1 capsule (100 mg total) by mouth 2 (two) times daily. 14 capsule 0  . predniSONE (DELTASONE) 10 MG tablet Label  & dispense according to the schedule below. 3 Pills PO for 1 day then, 2 Pills PO for 1 day, 1 Pills PO for 1 day then STOP. 6 tablet 0  . sucralfate (CARAFATE) 1 g tablet Take by mouth.     No current facility-administered medications for this visit.    Review of Systems:  GENERAL: Fatigue. No fevers or sweats. Weight loss of 24 pounds in 4 months. PERFORMANCE STATUS (ECOG): 2 HEENT: Mouth sores, wax and wane. Hoarse s/p ENT evaluation. No visual changes. Lungs: No shortness of breath or cough. Phlegm. No hemoptysis. Cardiac: No chest pain, palpitations, orthopnea, or PND. GI: Poor appetite. Nausea. No vomiting, diarrhea, constipation, melena or hematochezia. GU: No urgency, frequency, dysuria, or hematuria. Musculoskeletal: Arthritis hands, knees, elbows. No muscle tenderness. Extremities: Arthritis. No lower extremity swelling. Skin: No rashes or skin changes. Neuro: No headache, numbness or weakness, balance or coordination issues. Endocrine: No  diabetes, thyroid issues, hot flashes or night sweats. Psych: No mood changes, depression or anxiety. Pain: No focal pain. Review of systems: All other systems reviewed and found to be negative  Physical Exam: Blood pressure 122/77, pulse 105, temperature 96.2 F (35.7 C), temperature source Tympanic, resp. rate 18, height 5\' 10"  (1.778 m), weight 163 lb 14.6 oz (74.35 kg). GENERAL: Well developed, well nourished,gentleman sitting comfortably on the medical unit in no acute distress. MENTAL STATUS: Alert and oriented to person, place and time. HEAD: Short black hair with graying. [name]. Normocephalic, atraumatic, face symmetric, no Cushingoid features. EYES: Blue-gray eyes. Pupils equal round and reactive to light and accomodation. No conjunctivitis or scleral icterus. ENT: Oropharynx clear without lesion. Upper dentures. Tongue normal. Mucous membranes moist.  RESPIRATORY: Clear to auscultation without rales, wheezes or rhonchi. CARDIOVASCULAR: Regular rate and rhythm without murmur, rub or gallop. ABDOMEN: Soft, non-tender, with active bowel sounds, and no hepatomegaly. Spleen palpable to umbilicus. No masses. SKIN: No rashes, ulcers or lesions. EXTREMITIES: No edema, no skin discoloration or tenderness. No palpable cords. LYMPH NODES: No palpable cervical, supraclavicular, axillary or inguinal adenopathy  NEUROLOGICAL: Unremarkable. PSYCH: Appropriate.  No visits with results within 3 Day(s) from this visit. Latest known visit with results is:  Admission on 07/23/2015,  Discharged on 07/28/2015  No results displayed because visit has over 200 results.      Assessment:  CHANCETON BOOHER is a 55 y.o. 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  Original work-up at [facility] revealed signicantly elevated CCP and RF. ANA was > 1:640 but had negative dsDNA and ENA.  He was unresponsive to methotrexate.  He was admitted to [facility]  admitted with bronchitis, mouth sores, and decreased oral intake. CBC revealed a hematocrit of 21.4, hemoglobin 6.9, MCV 66.3, platelets 85,000, WBC 400. CXR revealed slight bronchitic changes. He was transfused with PRBCs. He was treated with azithromycin and ceftriaxone and discharged on doxycycline.  Additional testing during his admission included the following normal studies: HIV testing , hepatitis B surface antigen, hepatitis B core antibody , hepatitis C antibody, ANA, ferritin (267), iron saturation (37%), B12 (440), folate (8.1). Reticulocyte count was 3.6%.  LDH was 196 (98-192).  Urine revealed no protein.  LFTs were normal.  Albumen was 2.0 (low).  Abdomen and pelvic CT scan on 07/26/2015 revealed an area of peribronchovascular opacity in the central right middle lobe (new) suspicious for bronchopneumonia.  There was marked splenomegaly (30 cm x 12 cm x 23 cm, previously 22 cm x 8.7 cm x 16 cm).  There was a small infrarenal abdominal aortic aneurysm (2.6 cm).  Hand films revealed moderate periarticular osteopenia but no significant degenerative changes or erosive findings.  Bone marrow aspirate and biopsy on 07/27/2015 revealed a hypercellular marrow for age (70-80%) with relative myeloid hyperplasia, mild multilineage dyspoiesis and no increased blasts.  There was mildly increased T-lymphocytes, nonspecific.  There was mild polytypic plasmocytosis.  There was mild to focally moderate increase in reticulin.  Storage iron was present.Flow cytometry revealed no significant myeloid/monocytic abnormalities or increase in blasts.  There was no monoclonality.  There was a relatively increased T-cell receptor gamma/delta+ T cells (9%), nonspecific.  Symptomatically, he notes waxing and waning mouth ulcers.  He has had no interval fevers or infections.  He has lost 24 pounds in the past 4 months.   Plan: 1.  Review hospital laboratory evaluation and bone marrow.  Bone marrow was similar to  marrow performed at [facility].  Await cytogenetics.  Call about T cell receptor gene rearrangement studies.  Discuss lack of significant findings on hand films.  Discuss plan to follow-up with Dr. Sterling Big and Dr. Santiago Bumpers.  Discuss prior conversations about Rituxan  Discuss splenectomy. 2.  Follow-up pending marrow studies (cytogenetics) and T-cell gene rearrangement ([facility] [phone] ; Dr. Learta Codding).  r/o LGL leukemia. 3.  Review fever and neutropenia precautions. 4.  Chemotherapy teaching class. 5.  Phone follow-up with Dr. Gaylyn Cheers and Dr. Golda Acre. 6.  RTC in 1-2 weeks for MD assessment, labs (CBC with diff, CMP, Mg, LDH, uric acid) and week #1 Rituxan.   Lequita Asal, MD  08/06/2015, 3:07 PM

## 2015-08-07 ENCOUNTER — Other Ambulatory Visit: Payer: Self-pay | Admitting: *Deleted

## 2015-08-07 ENCOUNTER — Inpatient Hospital Stay: Payer: Medicaid Other

## 2015-08-07 DIAGNOSIS — D72819 Decreased white blood cell count, unspecified: Secondary | ICD-10-CM

## 2015-08-07 DIAGNOSIS — D61818 Other pancytopenia: Secondary | ICD-10-CM

## 2015-08-07 NOTE — Patient Instructions (Signed)
Rituximab injection What is this medicine? RITUXIMAB (ri TUX i mab) is a monoclonal antibody. It is used commonly to treat non-Hodgkin lymphoma and other conditions. It is also used to treat rheumatoid arthritis (RA). In RA, this medicine slows the inflammatory process and help reduce joint pain and swelling. This medicine is often used with other cancer or arthritis medications. This medicine may be used for other purposes; ask your health care provider or pharmacist if you have questions. What should I tell my health care provider before I take this medicine? They need to know if you have any of these conditions: -blood disorders -heart disease -history of hepatitis B -infection (especially a virus infection such as chickenpox, cold sores, or herpes) -irregular heartbeat -kidney disease -lung or breathing disease, like asthma -lupus -an unusual or allergic reaction to rituximab, mouse proteins, other medicines, foods, dyes, or preservatives -pregnant or trying to get pregnant -breast-feeding How should I use this medicine? This medicine is for infusion into a vein. It is administered in a hospital or clinic by a specially trained health care professional. A special MedGuide will be given to you by the pharmacist with each prescription and refill. Be sure to read this information carefully each time. Talk to your pediatrician regarding the use of this medicine in children. This medicine is not approved for use in children. Overdosage: If you think you have taken too much of this medicine contact a poison control center or emergency room at once. NOTE: This medicine is only for you. Do not share this medicine with others. What if I miss a dose? It is important not to miss a dose. Call your doctor or health care professional if you are unable to keep an appointment. What may interact with this medicine? -cisplatin -medicines for blood pressure -some other medicines for  arthritis -vaccines This list may not describe all possible interactions. Give your health care provider a list of all the medicines, herbs, non-prescription drugs, or dietary supplements you use. Also tell them if you smoke, drink alcohol, or use illegal drugs. Some items may interact with your medicine. What should I watch for while using this medicine? Report any side effects that you notice during your treatment right away, such as changes in your breathing, fever, chills, dizziness or lightheadedness. These effects are more common with the first dose. Visit your prescriber or health care professional for checks on your progress. You will need to have regular blood work. Report any other side effects. The side effects of this medicine can continue after you finish your treatment. Continue your course of treatment even though you feel ill unless your doctor tells you to stop. Call your doctor or health care professional for advice if you get a fever, chills or sore throat, or other symptoms of a cold or flu. Do not treat yourself. This drug decreases your body's ability to fight infections. Try to avoid being around people who are sick. This medicine may increase your risk to bruise or bleed. Call your doctor or health care professional if you notice any unusual bleeding. Be careful brushing and flossing your teeth or using a toothpick because you may get an infection or bleed more easily. If you have any dental work done, tell your dentist you are receiving this medicine. Avoid taking products that contain aspirin, acetaminophen, ibuprofen, naproxen, or ketoprofen unless instructed by your doctor. These medicines may hide a fever. Do not become pregnant while taking this medicine. Women should inform their doctor if   they wish to become pregnant or think they might be pregnant. There is a potential for serious side effects to an unborn child. Talk to your health care professional or pharmacist for more  information. Do not breast-feed an infant while taking this medicine. What side effects may I notice from receiving this medicine? Side effects that you should report to your doctor or health care professional as soon as possible: -allergic reactions like skin rash, itching or hives, swelling of the face, lips, or tongue -low blood counts - this medicine may decrease the number of white blood cells, red blood cells and platelets. You may be at increased risk for infections and bleeding. -signs of infection - fever or chills, cough, sore throat, pain or difficulty passing urine -signs of decreased platelets or bleeding - bruising, pinpoint red spots on the skin, black, tarry stools, blood in the urine -signs of decreased red blood cells - unusually weak or tired, fainting spells, lightheadedness -breathing problems -confused, not responsive -chest pain -fast, irregular heartbeat -feeling faint or lightheaded, falls -mouth sores -redness, blistering, peeling or loosening of the skin, including inside the mouth -stomach pain -swelling of the ankles, feet, or hands -trouble passing urine or change in the amount of urine Side effects that usually do not require medical attention (report to your doctor or other health care professional if they continue or are bothersome): -anxiety -headache -loss of appetite -muscle aches -nausea -night sweats This list may not describe all possible side effects. Call your doctor for medical advice about side effects. You may report side effects to FDA at 1-800-FDA-1088. Where should I keep my medicine? This drug is given in a hospital or clinic and will not be stored at home. NOTE: This sheet is a summary. It may not cover all possible information. If you have questions about this medicine, talk to your doctor, pharmacist, or health care provider.    2016, Elsevier/Gold Standard. (2014-05-31 22:30:56)  

## 2015-08-09 ENCOUNTER — Telehealth: Payer: Self-pay | Admitting: *Deleted

## 2015-08-09 NOTE — Telephone Encounter (Signed)
Left message that I spoke to Marcus [name] and the patient is already hired a Chief Executive Officer last year to help with disability. Due to his age 55 unless he is determined to be disabled there is nothing more to do on the medicaid side of things. Marcus [name] the social worker is looking into whether pt would qualify for free drug rituxan through the manufacturer of drug.  Also Marcus [name] will call him on mondya next week to make appt. Fill out financial profile and about the drug asst.

## 2015-08-12 ENCOUNTER — Encounter: Payer: Self-pay | Admitting: Hematology and Oncology

## 2015-08-14 ENCOUNTER — Inpatient Hospital Stay: Payer: Medicaid Other

## 2015-08-14 ENCOUNTER — Inpatient Hospital Stay: Payer: Medicaid Other | Admitting: Hematology and Oncology

## 2015-08-15 NOTE — Progress Notes (Unsigned)
PSN called patient, yesterday, to schedule a time to meet to complete patient assistance documents for the drug, rituxan.  PSN left patient a message to call back so a time to meet could be scheduled.  Awaiting a return call from patient.

## 2015-08-24 NOTE — Progress Notes (Unsigned)
PSN has left 2 additional messages with patient, asking that he call to schedule a time to meet to complete patient assistance forms for the drug, Rituxan.  Patient has not responded.  PSN called today leaving patient a date and time to meet on his answering machine.  PSN asked patient to come May 23rd at 10:30am.

## 2015-08-28 NOTE — Progress Notes (Unsigned)
Patient did not show up for his 10:30am appointment today.

## 2015-11-14 ENCOUNTER — Emergency Department (HOSPITAL_COMMUNITY): Payer: Medicaid Other

## 2015-11-14 ENCOUNTER — Inpatient Hospital Stay (HOSPITAL_COMMUNITY)
Admission: EM | Admit: 2015-11-14 | Discharge: 2015-11-16 | DRG: 808 | Disposition: A | Discharge status: Home / Self Care | Payer: Medicaid Other | Attending: Internal Medicine | Admitting: Internal Medicine

## 2015-11-14 ENCOUNTER — Encounter (HOSPITAL_COMMUNITY): Payer: Self-pay | Admitting: Emergency Medicine

## 2015-11-14 DIAGNOSIS — R161 Splenomegaly, not elsewhere classified: Secondary | ICD-10-CM | POA: Diagnosis present

## 2015-11-14 DIAGNOSIS — J449 Chronic obstructive pulmonary disease, unspecified: Secondary | ICD-10-CM | POA: Diagnosis present

## 2015-11-14 DIAGNOSIS — Z825 Family history of asthma and other chronic lower respiratory diseases: Secondary | ICD-10-CM

## 2015-11-14 DIAGNOSIS — E43 Unspecified severe protein-calorie malnutrition: Secondary | ICD-10-CM | POA: Diagnosis present

## 2015-11-14 DIAGNOSIS — Z720 Tobacco use: Secondary | ICD-10-CM

## 2015-11-14 DIAGNOSIS — D649 Anemia, unspecified: Secondary | ICD-10-CM

## 2015-11-14 DIAGNOSIS — M059 Rheumatoid arthritis with rheumatoid factor, unspecified: Secondary | ICD-10-CM | POA: Diagnosis present

## 2015-11-14 DIAGNOSIS — Z8249 Family history of ischemic heart disease and other diseases of the circulatory system: Secondary | ICD-10-CM | POA: Diagnosis not present

## 2015-11-14 DIAGNOSIS — Z79899 Other long term (current) drug therapy: Secondary | ICD-10-CM

## 2015-11-14 DIAGNOSIS — F17210 Nicotine dependence, cigarettes, uncomplicated: Secondary | ICD-10-CM | POA: Diagnosis present

## 2015-11-14 DIAGNOSIS — E871 Hypo-osmolality and hyponatremia: Secondary | ICD-10-CM | POA: Diagnosis present

## 2015-11-14 DIAGNOSIS — K210 Gastro-esophageal reflux disease with esophagitis: Secondary | ICD-10-CM | POA: Diagnosis present

## 2015-11-14 DIAGNOSIS — M0500 Felty's syndrome, unspecified site: Secondary | ICD-10-CM | POA: Diagnosis present

## 2015-11-14 DIAGNOSIS — Z6820 Body mass index (BMI) 20.0-20.9, adult: Secondary | ICD-10-CM | POA: Diagnosis not present

## 2015-11-14 DIAGNOSIS — D61818 Other pancytopenia: Principal | ICD-10-CM | POA: Diagnosis present

## 2015-11-14 DIAGNOSIS — R531 Weakness: Secondary | ICD-10-CM | POA: Diagnosis present

## 2015-11-14 DIAGNOSIS — E440 Moderate protein-calorie malnutrition: Secondary | ICD-10-CM | POA: Diagnosis present

## 2015-11-14 HISTORY — DX: Chronic obstructive pulmonary disease, unspecified: J44.9

## 2015-11-14 LAB — URINALYSIS, ROUTINE W REFLEX MICROSCOPIC
BILIRUBIN URINE: NEGATIVE
GLUCOSE, UA: NEGATIVE mg/dL
Hgb urine dipstick: NEGATIVE
KETONES UR: NEGATIVE mg/dL
Leukocytes, UA: NEGATIVE
NITRITE: NEGATIVE
PH: 6 (ref 5.0–8.0)
PROTEIN: NEGATIVE mg/dL
Specific Gravity, Urine: <1.005 — ABNORMAL LOW (ref 1.005–1.030)

## 2015-11-14 LAB — CBC WITH DIFFERENTIAL/PLATELET
Basophils Absolute: 0 10*3/uL (ref 0.0–0.1)
Basophils Relative: 3 %
Eosinophils Absolute: 0 10*3/uL (ref 0.0–0.7)
Eosinophils Relative: 0 %
HEMATOCRIT: 19.3 % — AB (ref 39.0–52.0)
HEMOGLOBIN: 6 g/dL — AB (ref 13.0–17.0)
LYMPHS ABS: 0.2 10*3/uL — AB (ref 0.7–4.0)
LYMPHS PCT: 67 %
MCH: 22.9 pg — AB (ref 26.0–34.0)
MCHC: 31.1 g/dL (ref 30.0–36.0)
MCV: 73.7 fL — ABNORMAL LOW (ref 78.0–100.0)
MONO ABS: 0.1 10*3/uL (ref 0.1–1.0)
MONOS PCT: 17 %
NEUTROS ABS: 0 10*3/uL — AB (ref 1.7–7.7)
NEUTROS PCT: 13 %
Platelets: 39 10*3/uL — ABNORMAL LOW (ref 150–400)
RBC: 2.62 MIL/uL — ABNORMAL LOW (ref 4.22–5.81)
RDW: 21.9 % — ABNORMAL HIGH (ref 11.5–15.5)
WBC: 0.3 10*3/uL — CL (ref 4.0–10.5)

## 2015-11-14 LAB — COMPREHENSIVE METABOLIC PANEL
ALBUMIN: 2 g/dL — AB (ref 3.5–5.0)
ALK PHOS: 54 U/L (ref 38–126)
ALT: 8 U/L — AB (ref 17–63)
AST: 12 U/L — AB (ref 15–41)
Anion gap: 2 — ABNORMAL LOW (ref 5–15)
BUN: 17 mg/dL (ref 6–20)
CALCIUM: 6.9 mg/dL — AB (ref 8.9–10.3)
CO2: 25 mmol/L (ref 22–32)
CREATININE: 0.55 mg/dL — AB (ref 0.61–1.24)
Chloride: 102 mmol/L (ref 101–111)
GFR calc non Af Amer: >60 mL/min (ref >60)
GLUCOSE: 95 mg/dL (ref 65–99)
Potassium: 4.4 mmol/L (ref 3.5–5.1)
SODIUM: 129 mmol/L — AB (ref 135–145)
Total Bilirubin: 0.8 mg/dL (ref 0.3–1.2)
Total Protein: 6.8 g/dL (ref 6.5–8.1)

## 2015-11-14 LAB — PROTIME-INR
INR: 1.24
Prothrombin Time: 15.7 seconds — ABNORMAL HIGH (ref 11.4–15.2)

## 2015-11-14 LAB — ABO/RH: ABO/RH(D): A POS

## 2015-11-14 LAB — LACTIC ACID, PLASMA: Lactic Acid, Venous: 0.7 mmol/L (ref 0.5–1.9)

## 2015-11-14 LAB — PREPARE RBC (CROSSMATCH)

## 2015-11-14 LAB — TROPONIN I: Troponin I: <0.03 ng/mL (ref <0.03)

## 2015-11-14 LAB — LIPASE, BLOOD: Lipase: 40 U/L (ref 11–51)

## 2015-11-14 IMAGING — CT CT ABD-PELV W/ CM
4 of 5 series · 10 of 46 positions shown, 15 images · IV contrast (iopamidol)
Comparison: [DATE] abdomen and pelvic CT.

CLINICAL DATA: 55-year-old male with abdominal and pelvic pain with
weakness. History of rheumatoid arthritis and Felty syndrome.

EXAM:
CT CHEST, ABDOMEN, AND PELVIS WITH CONTRAST
TECHNIQUE: Multidetector CT imaging of the chest, abdomen and pelvis was
performed following the standard protocol during bolus
administration of intravenous contrast.
CONTRAST:  100mL [JX] IOPAMIDOL ([JX]) INJECTION 61%

[Series 2: chest abd pel with contrast · axial · 0.77mm/px · z∈[-636,-396]mm · 4 of 135 slices shown]
[im 13/135  soft-tissue]
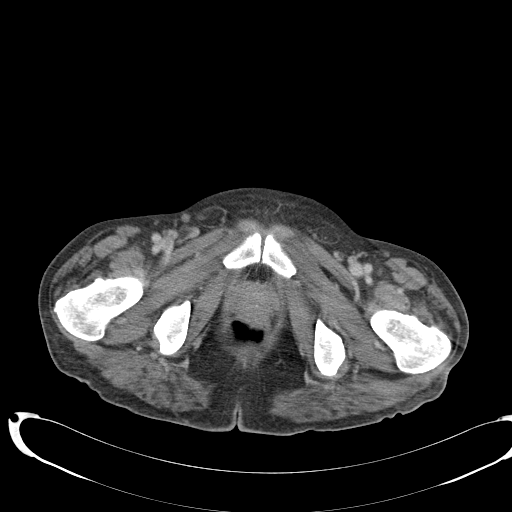
[im 25/135  soft-tissue]
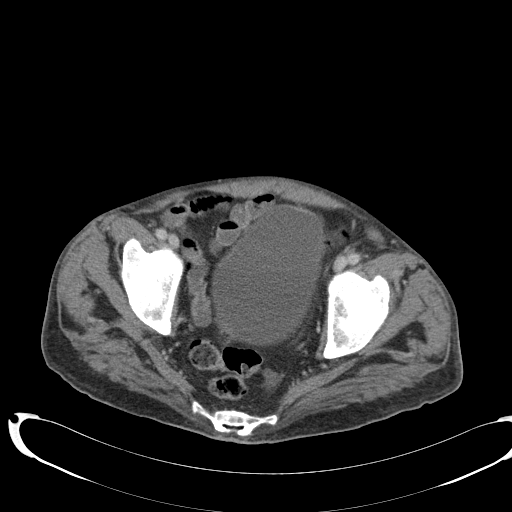
[im 49/135  soft-tissue]
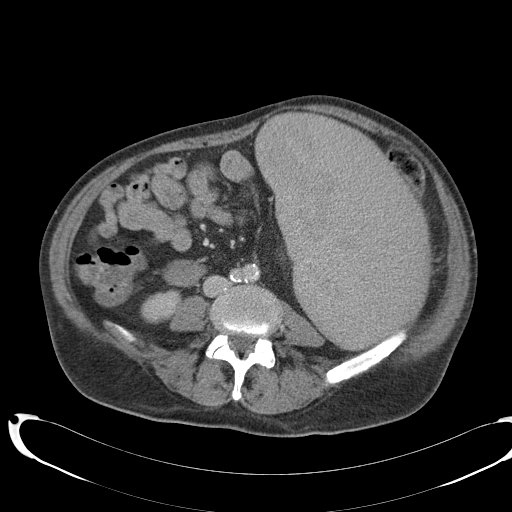
[im 61/135  soft-tissue]
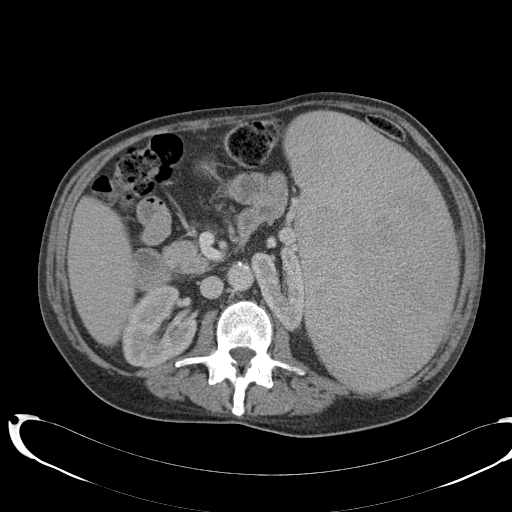

[Series 3: coronal cor · coronal · 1.10mm/px · 3 of 55 slices shown, 4 images]
[im 19/55  soft-tissue]
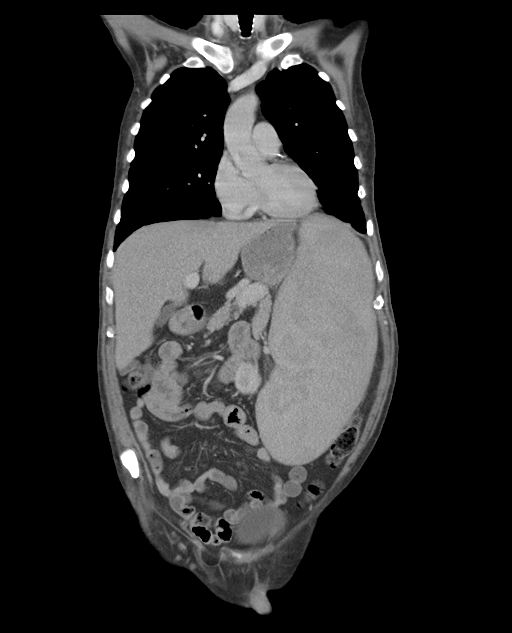
[im 25/55  soft-tissue]
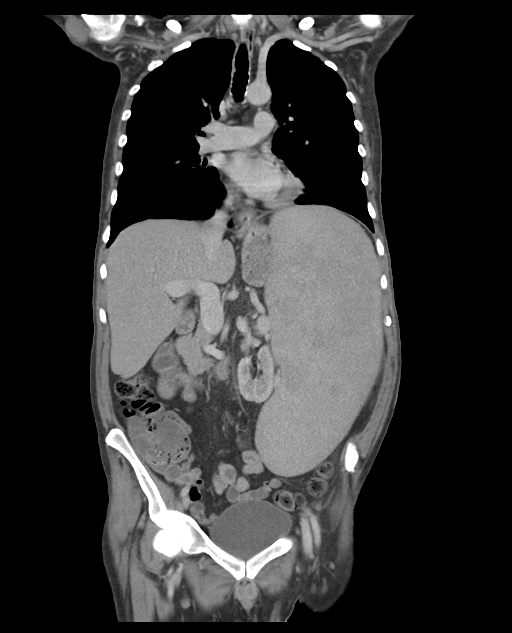
[im 25/55  bone]
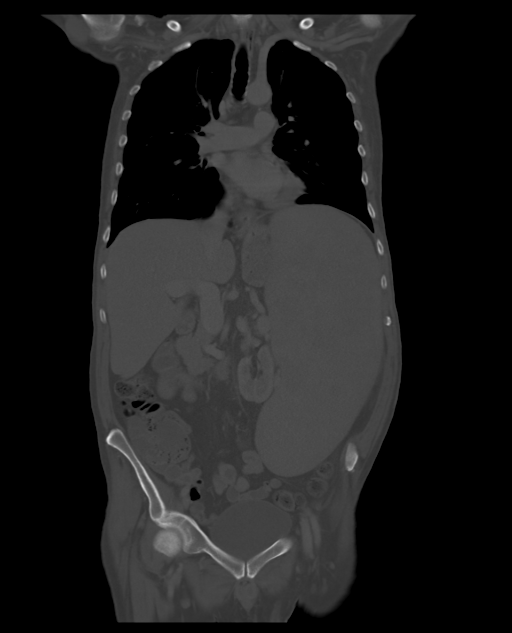
[im 31/55  soft-tissue]
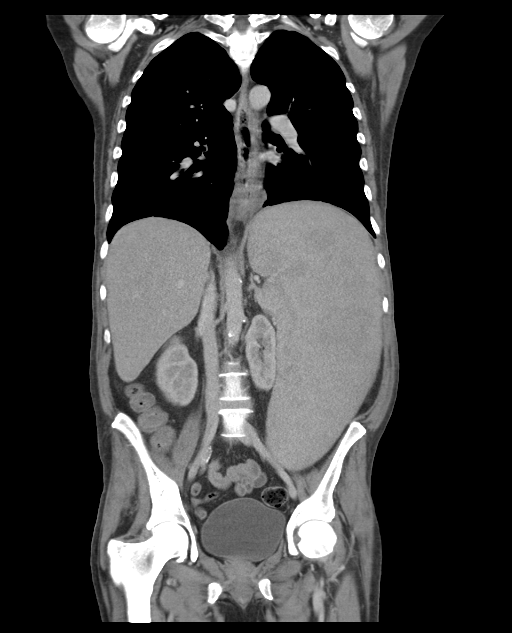

[Series 4: sagittal · sagittal · 0.60mm/px · 1 of 71 slices shown, 2 images]
[im 24/71  soft-tissue]
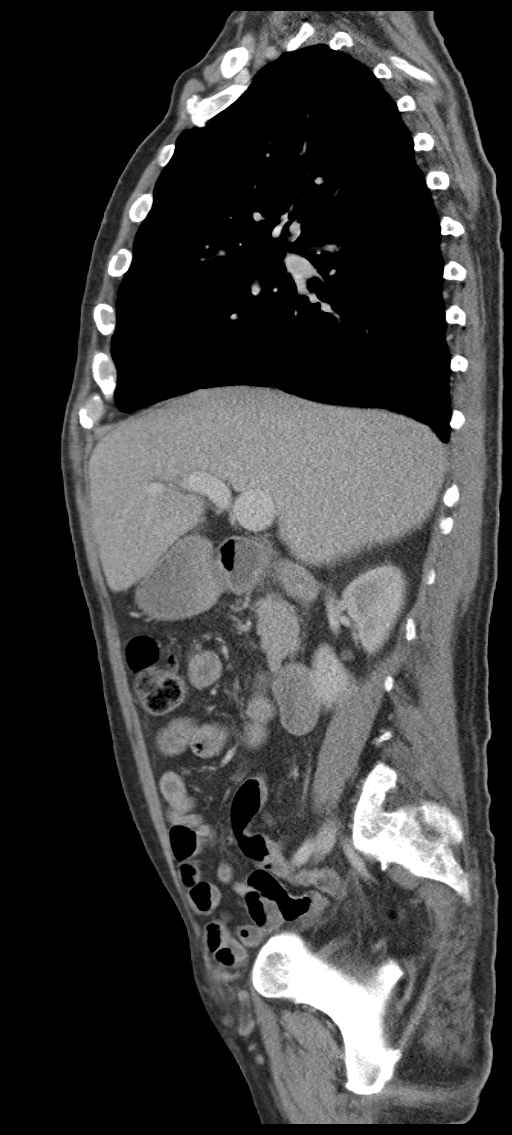
[im 24/71  bone]
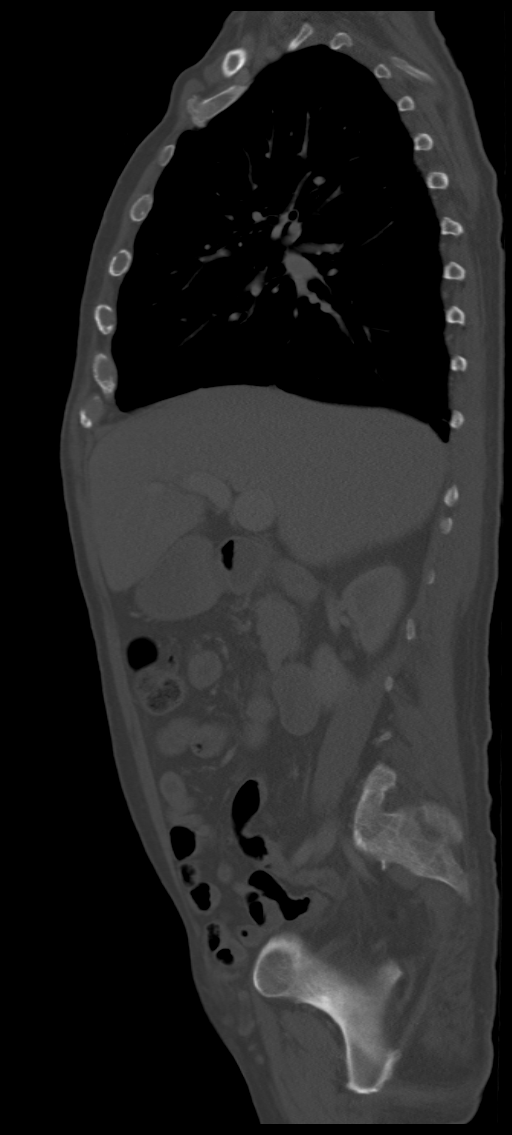

[Series 8: kidney delays · axial · 0.75mm/px · z∈[-386,-322]mm · 2 of 41 slices shown, 5 images]
[im 14/41  soft-tissue]
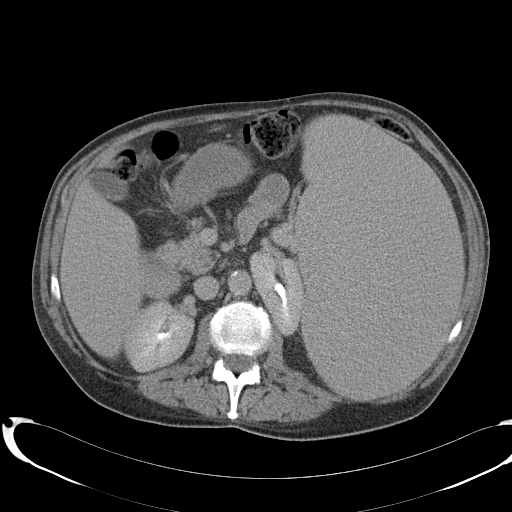
[im 14/41  lung]
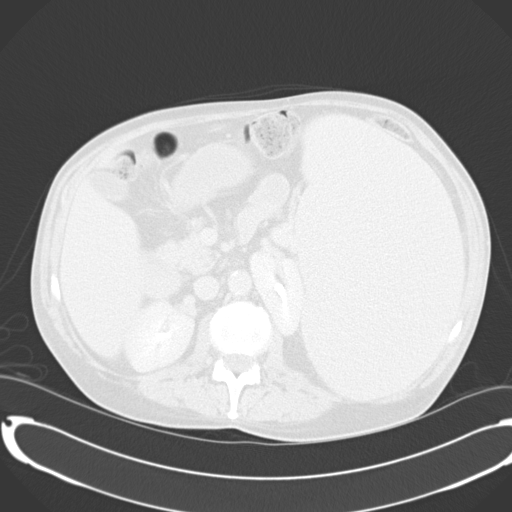
[im 14/41  bone]
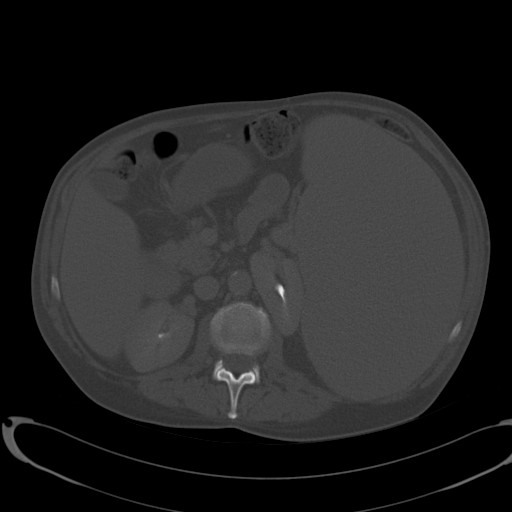
[im 27/41  soft-tissue]
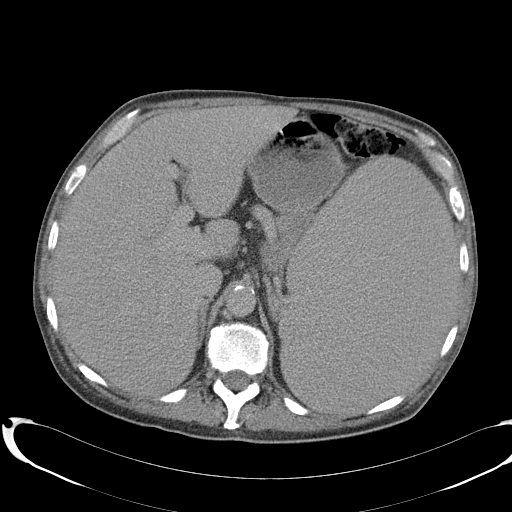
[im 27/41  lung]
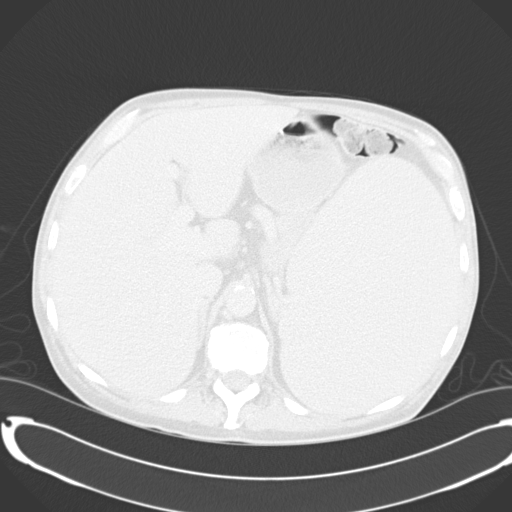

[10 of 46 positions shown; findings below may reference images not displayed]

FINDINGS: CT CHEST FINDINGS

Cardiovascular: Heart size is within normal limits. Moderate
coronary artery calcifications again noted. There is no evidence of
thoracic aortic aneurysm.

Mediastinum/Nodes: No mediastinal mass, enlarged lymph nodes or
pericardial effusion noted. A small hiatal hernia is noted with
unchanged mild circumferential distal esophageal wall thickening.

Lungs/Pleura: Moderate centrilobular emphysema again noted. There is
no evidence of airspace disease, consolidation, nodule, mass,
pleural effusion or pneumothorax.

Musculoskeletal: No acute or suspicious abnormalities.

CT ABDOMEN PELVIS FINDINGS

Hepatobiliary: The liver and gallbladder are unremarkable. There is
no evidence of biliary dilatation.

Pancreas: Unremarkable

Spleen: Marked splenomegaly is unchanged with the spleen measuring
12 x 22 x 30 cm (volume of [JX] cc). No definite focal splenic
lesions or infarcts identified.

Adrenals/Urinary Tract: Mass effect on the left kidney from the
markedly enlarged spleen again noted. The kidneys are otherwise
unremarkable. The adrenal glands and bladder are unremarkable.

Stomach/Bowel: No evidence of bowel obstruction or definite small
bowel/colonic wall thickening. The appendix is normal.

Vascular/Lymphatic: Abdominal aortic atherosclerotic calcifications
noted without aneurysm. No enlarged lymph nodes are identified.

Reproductive: Unremarkable

Other: A small right inguinal hernia is noted with a loop of small
bowel extending towards the hernia. There is no evidence of free
fluid, abscess or pneumoperitoneum.

Musculoskeletal: No acute or suspicious abnormalities.
IMPRESSION: No evidence of new or acute abnormality.

Unchanged marked splenomegaly (volume of [JX] cc) with mass effect
on the adjacent left kidney.

Small hiatal hernia with unchanged mild circumferential distal
esophageal wall thickening/esophagitis.

Abdominal aortic atherosclerosis.

Moderate centrilobular emphysema.

Coronary artery disease.

## 2015-11-14 IMAGING — DX DG CHEST 2V
2 series · 2 of 2 positions shown · non-contrast
Comparison: [DATE]

CLINICAL DATA: Chest pain.

EXAM:
CHEST  2 VIEW

[chest pa]
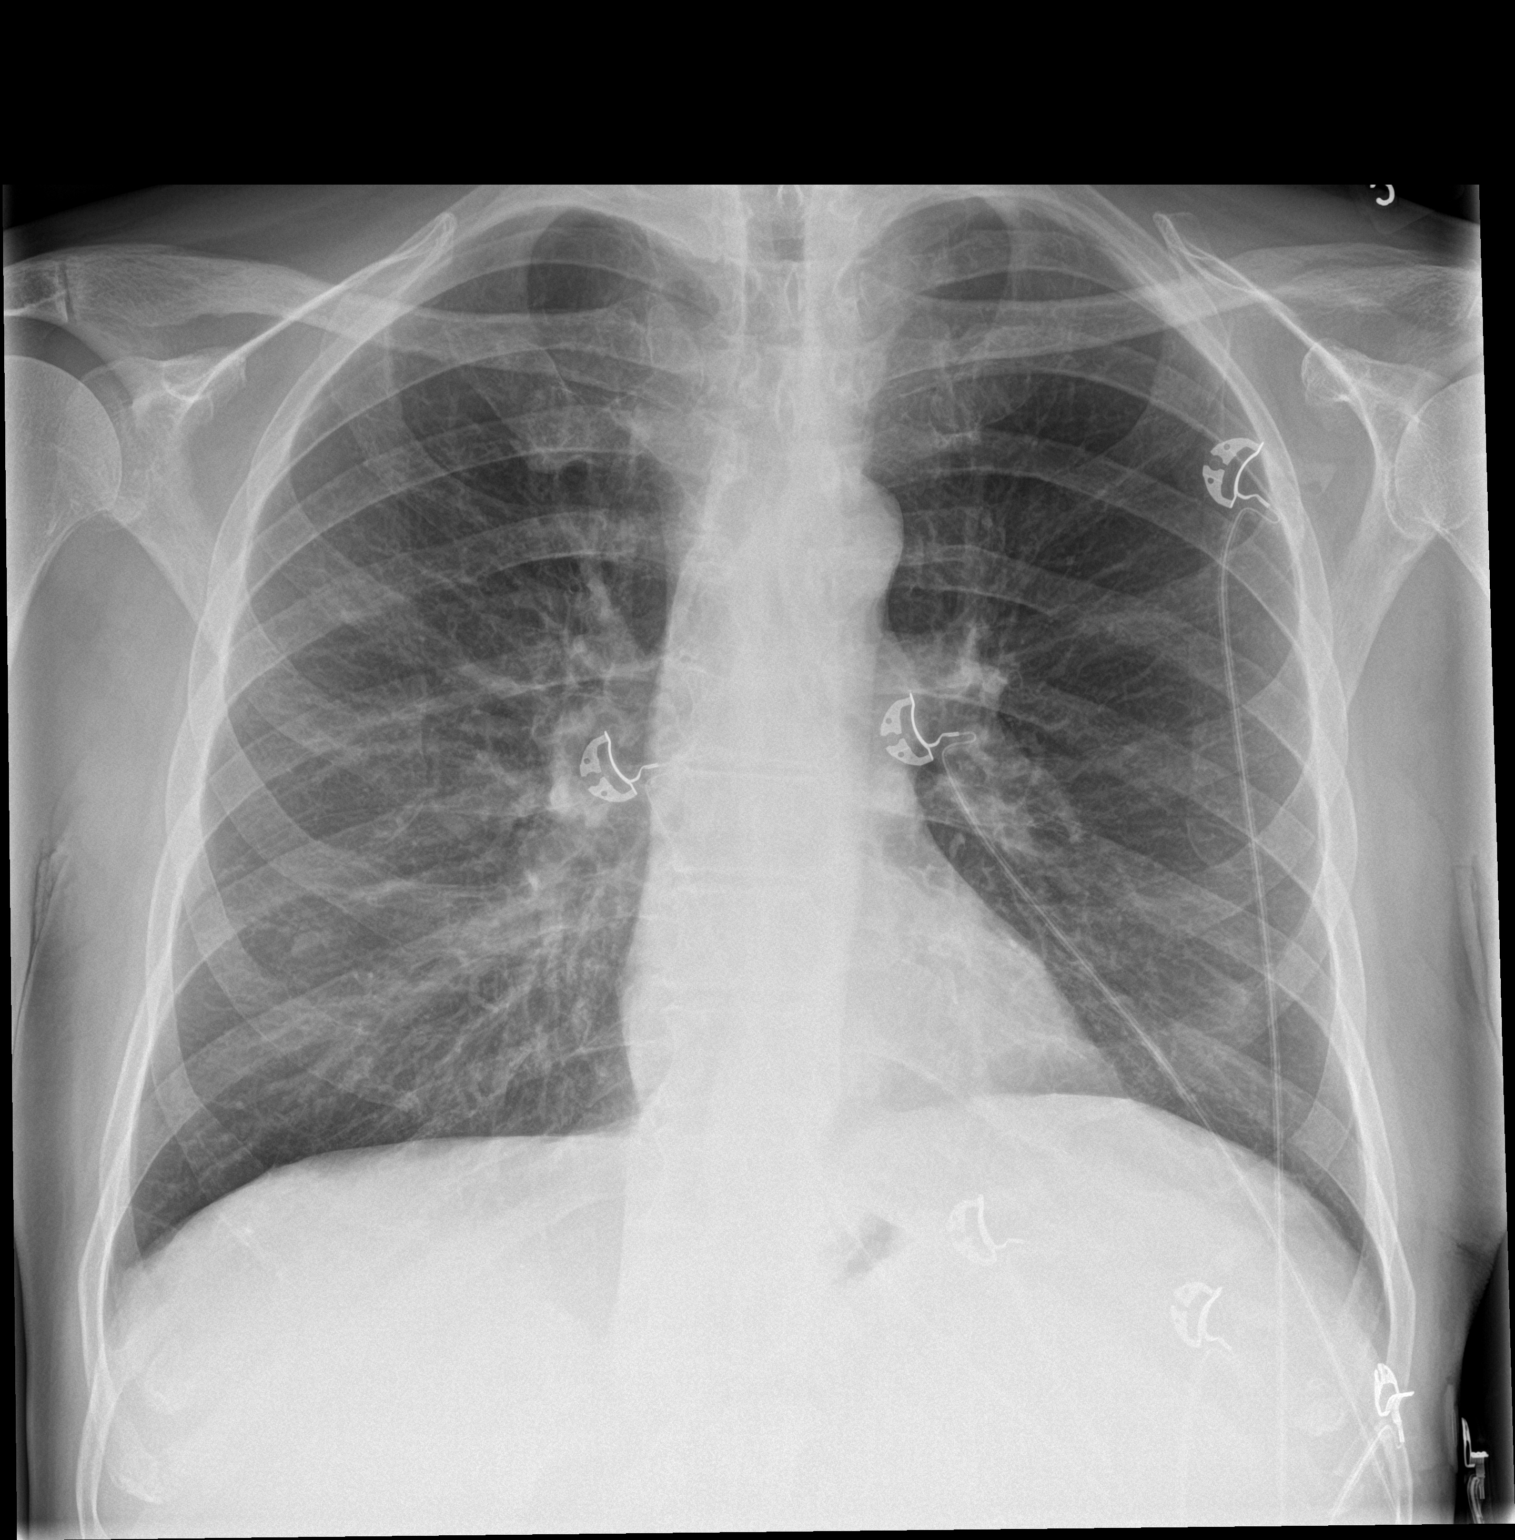

[chest lat]
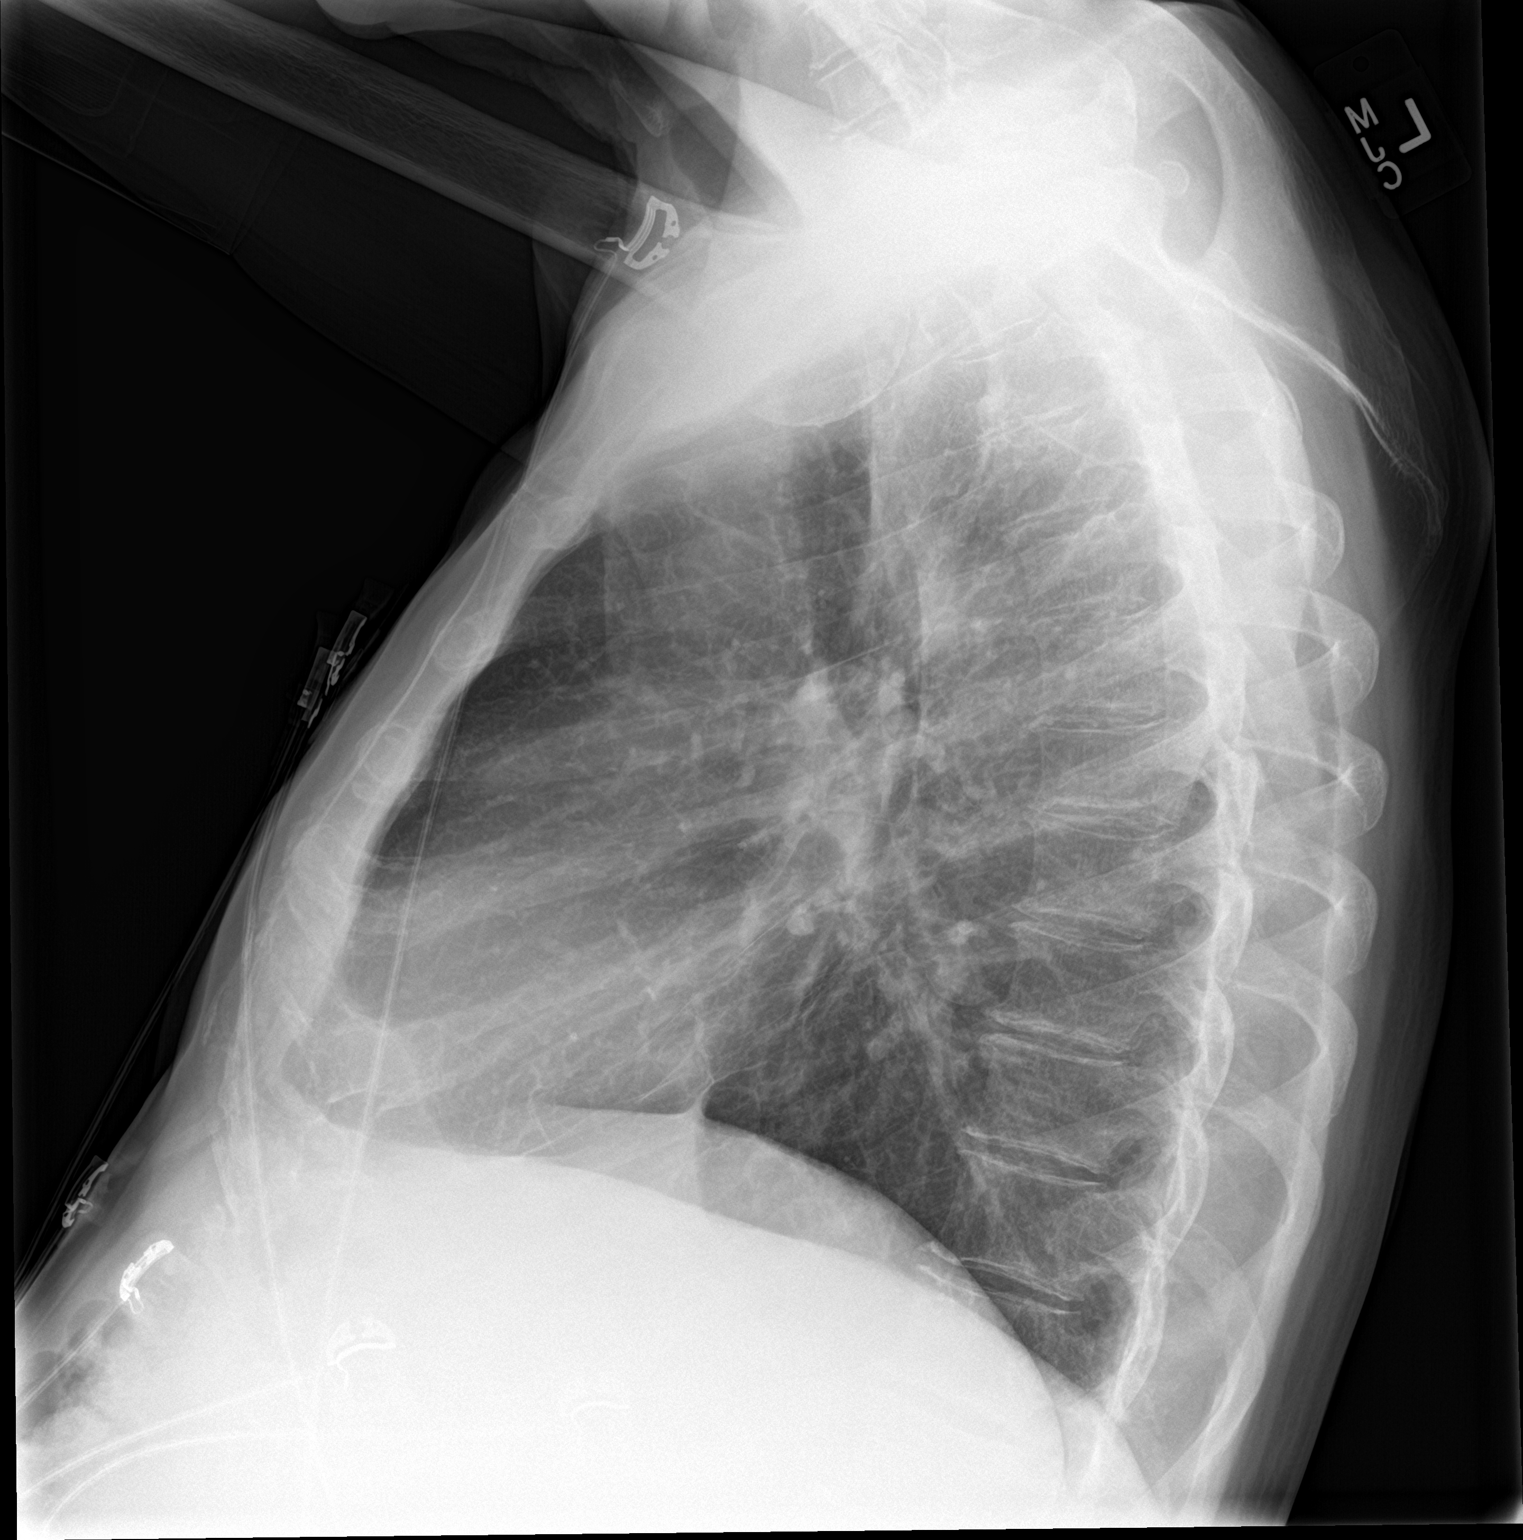

[2 of 2 positions shown; findings below may reference images not displayed]

FINDINGS: Heart size and pulmonary vascularity are normal and the lungs are
clear. Nipple shadows at the lung bases. No acute bone abnormality.
IMPRESSION: Normal chest.

## 2015-11-14 MED ORDER — ONDANSETRON HCL 4 MG/2ML IJ SOLN
4.0000 mg | Freq: Once | INTRAMUSCULAR | Status: AC
Start: 2015-11-14 — End: 2015-11-14
  Administered 2015-11-14: 4 mg via INTRAVENOUS
  Filled 2015-11-14: qty 2

## 2015-11-14 MED ORDER — IOPAMIDOL (ISOVUE-300) INJECTION 61%
100.0000 mL | Freq: Once | INTRAVENOUS | Status: AC | PRN
  Administered 2015-11-14: 100 mL via INTRAVENOUS

## 2015-11-14 MED ORDER — MORPHINE SULFATE (PF) 4 MG/ML IV SOLN
4.0000 mg | Freq: Once | INTRAVENOUS | Status: AC
  Administered 2015-11-14: 4 mg via INTRAVENOUS
  Filled 2015-11-14: qty 1

## 2015-11-14 MED ORDER — HYDROCODONE-ACETAMINOPHEN 5-325 MG PO TABS
1.0000 | ORAL_TABLET | ORAL | Status: AC | PRN
  Administered 2015-11-14 – 2015-11-15 (×2): 1 via ORAL
  Filled 2015-11-14 (×2): qty 1

## 2015-11-14 MED ORDER — ENSURE ENLIVE PO LIQD: 237.0000 mL | Freq: Two times a day (BID) | ORAL | Status: DC

## 2015-11-14 MED ORDER — SODIUM CHLORIDE 0.9 % IV BOLUS (SEPSIS)
1000.0000 mL | Freq: Once | INTRAVENOUS | Status: AC
  Administered 2015-11-14: 1000 mL via INTRAVENOUS

## 2015-11-14 MED ORDER — SODIUM CHLORIDE 0.9 % IV SOLN: 250.0000 mL | INTRAVENOUS | Status: DC | PRN

## 2015-11-14 MED ORDER — NICOTINE 21 MG/24HR TD PT24
21.0000 mg | MEDICATED_PATCH | Freq: Once | TRANSDERMAL | Status: AC
  Administered 2015-11-14: 21 mg via TRANSDERMAL
  Filled 2015-11-14: qty 1

## 2015-11-14 MED ORDER — SODIUM CHLORIDE 0.9% FLUSH
3.0000 mL | Freq: Two times a day (BID) | INTRAVENOUS | Status: DC
  Administered 2015-11-14 – 2015-11-16 (×4): 3 mL via INTRAVENOUS

## 2015-11-14 MED ORDER — SODIUM CHLORIDE 0.9 % IV SOLN
10.0000 mL/h | Freq: Once | INTRAVENOUS | Status: AC
  Administered 2015-11-14: 10 mL/h via INTRAVENOUS

## 2015-11-14 MED ORDER — IOPAMIDOL (ISOVUE-300) INJECTION 61%: 100.0000 mL | Freq: Once | INTRAVENOUS | Status: DC | PRN

## 2015-11-14 MED ORDER — SODIUM CHLORIDE 0.9% FLUSH: 3.0000 mL | INTRAVENOUS | Status: DC | PRN

## 2015-11-14 MED ORDER — IPRATROPIUM-ALBUTEROL 0.5-2.5 (3) MG/3ML IN SOLN
3.0000 mL | Freq: Once | RESPIRATORY_TRACT | Status: AC
  Administered 2015-11-14: 3 mL via RESPIRATORY_TRACT
  Filled 2015-11-14: qty 3

## 2015-11-14 NOTE — H&P (Signed)
History and Physical    Marcus [name] [id number] DOB: July 16, 1960 DOA: 11/14/2015  PCP: No PCP Per Patient  Patient coming from: home  Chief Complaint:   weak  HPI: Marcus [name] is a 55 y.o. male with medical history significant of rheumatoid arthritis, Feltys syndrome, copd comes in with months of not feeling well.  Pt was seen at [facility] in may of this year, for pna, was advised to follow up and start treatment for his RA however he declined this treatment because it "was rat poison".  Per his records he was suppose to be started on remicade.  It has been suggested to him also that he would need a splenectomy but he has also declined this in the past.  Pt is transferring all of his care here to [place].  At [facility] he had a bone marrow biopsy done which results are unknown.  He also was seen by hematology and rheumatology there.  Pt referred for admission for pancytopenia.  Pt denies any fevers.  No n/v/d.  Denies any overt bleeding.    Review of Systems: As per HPI otherwise 10 point review of systems negative.   Past Medical History:  Diagnosis Date  . COPD (chronic obstructive pulmonary disease) ([place])   . Felty syndrome ([place])   . Pancytopenia ([place])   . Seropositive rheumatoid arthritis ([place])   . Tobacco use disorder     History reviewed. No pertinent surgical history.   reports that he has been smoking Cigarettes.  He has been smoking about 1.00 pack per day. He does not have any smokeless tobacco history on file. He reports that he uses drugs, including Marijuana. He reports that he does not drink alcohol.  Allergies  Allergen Reactions  . Levaquin [Levofloxacin In D5w] Other (See Comments)    Chest pain  . Methotrexate Other (See Comments)    Chest pain    Family History  Problem Relation Age of Onset  . CAD Brother   . COPD Brother     Prior to Admission medications   Medication Sig Start Date End Date Taking? Authorizing Provider  predniSONE (DELTASONE) 10 MG  tablet Label  & dispense according to the schedule below. 3 Pills PO for 1 day then, 2 Pills PO for 1 day, 1 Pills PO for 1 day then STOP. Patient taking differently: Take 10 mg by mouth daily with breakfast.  07/28/15  Yes Henreitta Leber, MD  sucralfate (CARAFATE) 1 g tablet Take 1 g by mouth 3 (three) times daily.    Yes Historical Provider, MD  doxycycline (VIBRAMYCIN) 100 MG capsule Take 1 capsule (100 mg total) by mouth 2 (two) times daily. Patient not taking: Reported on 11/14/2015 07/28/15   Henreitta Leber, MD    Physical Exam: Vitals:   11/14/15 2020 11/14/15 2021 11/14/15 2056 11/14/15 2141  BP:   107/63 103/61  Pulse:   89 87  Resp:  '20 20 20  ' Temp: 98 F (36.7 C)  98.1 F (36.7 C) 98 F (36.7 C)  TempSrc: Oral  Oral Oral  SpO2:   100% 100%  Weight:   63.3 kg (139 lb 9.6 oz)   Height:          Constitutional: NAD, calm, comfortable Vitals:   11/14/15 2020 11/14/15 2021 11/14/15 2056 11/14/15 2141  BP:   107/63 103/61  Pulse:   89 87  Resp:  '20 20 20  ' Temp: 98 F (36.7 C)  98.1 F (36.7  C) 98 F (36.7 C)  TempSrc: Oral  Oral Oral  SpO2:   100% 100%  Weight:   63.3 kg (139 lb 9.6 oz)   Height:       Eyes: PERRL, lids and conjunctivae normal ENMT: Mucous membranes are moist. Posterior pharynx clear of any exudate or lesions.Normal dentition.  Neck: normal, supple, no masses, no thyromegaly Respiratory: clear to auscultation bilaterally, no wheezing, no crackles. Normal respiratory effort. No accessory muscle use.  Cardiovascular: Regular rate and rhythm, no murmurs / rubs / gallops. No extremity edema. 2+ pedal pulses. No carotid bruits.  Abdomen: no tenderness, no masses palpated. Large sleen. Bowel sounds positive.  Musculoskeletal: no clubbing / cyanosis. No joint deformity upper and lower extremities. Good ROM, no contractures. Normal muscle tone.  Skin: no rashes, lesions, ulcers. No induration Neurologic: CN 2-12 grossly intact. Sensation intact, DTR normal.  Strength 5/5 in all 4.  Psychiatric: Normal judgment and insight. Alert and oriented x 3. Normal mood.    Labs on Admission: I have personally reviewed following labs and imaging studies  CBC:  Recent Labs Lab 11/14/15 1730  WBC 0.3*  NEUTROABS 0.0*  HGB 6.0*  HCT 19.3*  MCV 73.7*  PLT 39*   Basic Metabolic Panel:  Recent Labs Lab 11/14/15 1730  NA 129*  K 4.4  CL 102  CO2 25  GLUCOSE 95  BUN 17  CREATININE 0.55*  CALCIUM 6.9*   GFR: Estimated Creatinine Clearance: 94.5 mL/min (by C-G formula based on SCr of 0.8 mg/dL). Liver Function Tests:  Recent Labs Lab 11/14/15 1730  AST 12*  ALT 8*  ALKPHOS 54  BILITOT 0.8  PROT 6.8  ALBUMIN 2.0*    Recent Labs Lab 11/14/15 1730  LIPASE 40   No results for input(s): AMMONIA in the last 168 hours. Coagulation Profile:  Recent Labs Lab 11/14/15 1730  INR 1.24   Cardiac Enzymes:  Recent Labs Lab 11/14/15 1730  TROPONINI <0.03   Urine analysis:    Component Value Date/Time   COLORURINE YELLOW 11/14/2015 [place] 11/14/2015 1710   LABSPEC <1.005 (L) 11/14/2015 1710   PHURINE 6.0 11/14/2015 1710   GLUCOSEU NEGATIVE 11/14/2015 1710   HGBUR NEGATIVE 11/14/2015 1710   BILIRUBINUR NEGATIVE 11/14/2015 1710   KETONESUR NEGATIVE 11/14/2015 1710   PROTEINUR NEGATIVE 11/14/2015 1710   UROBILINOGEN 0.2 10/21/2012 1737   NITRITE NEGATIVE 11/14/2015 1710   LEUKOCYTESUR NEGATIVE 11/14/2015 1710    Radiological Exams on Admission: Dg Chest 2 View  Result Date: 11/14/2015 CLINICAL DATA:  Chest pain. EXAM: CHEST  2 VIEW COMPARISON:  07/23/2015 FINDINGS: Heart size and pulmonary vascularity are normal and the lungs are clear. Nipple shadows at the lung bases. No acute bone abnormality. IMPRESSION: Normal chest. Electronically Signed   By: Lorriane Shire M.D.   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  Hematology called and will see in am.  I had frank discussion with pt concerning his treatment plan, it sounds like he has refused various recommendations due to side effects of the treatment plan.  I have explained that his condition will likely continue to decline and that he should consider accepting some risk with any treatment plan recommended.  Will await heme recs in the am.  Active Problems:   Splenomegaly- noted   Pancytopenia ([place])- secondary to his RA, above  Protein-calorie malnutrition, severe- noted   Anemia- transfusing 2 units   Seropositive rheumatoid arthritis ([place])-    Chronic obstructive pulmonary disease ([place])   Admit to medical bed.   DVT prophylaxis:  scds Code Status:   Full code  Anoop Hemmer A MD [facility]  If 7PM-7AM, please contact night-coverage [email or web address] Password [email or web address]  11/14/2015, 9:50 PM

## 2015-11-14 NOTE — ED Triage Notes (Signed)
Pt reports right sided cp since April, was hospitalized in April and received 4 pints of blood. Today, pt cp is radiating into center of chest and right arm into back with occasional sob. Pt also c/o trouble swallowing.  Pt ambulatory to room.

## 2015-11-14 NOTE — ED Provider Notes (Signed)
[facility] DEPT Provider Note   CSN: [id number] Arrival date & time: 11/14/15  1637  First Provider Contact:  None       History   Chief Complaint Chief Complaint  Patient presents with  . Chest Pain    HPI Marcus [name] is a 55 y.o. male.  Pt presents to the ED today with chest and back pain.  He has a complicated pmhx, but has been lost to follow up.  This is a summary of his last doctor visit with Dr. Mike Gip in [place] on 08/06/15.  Assessment:  Marcus [name] is a 55 y.o. 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  Original work-up at [facility] revealed signicantly elevated CCP and RF. ANA was > 1:640 but had negative dsDNA and ENA.  He was unresponsive to methotrexate.  He was admitted to [facility] admitted with bronchitis, mouth sores, and decreased oral intake. CBC revealed a hematocrit of 21.4, hemoglobin 6.9, MCV 66.3, platelets 85,000, WBC 400. CXR revealed slight bronchitic changes. He was transfused with PRBCs. He was treated with azithromycin and ceftriaxone and discharged on doxycycline.  Additional testing during his admission included the following normal studies: HIV testing , hepatitis B surface antigen, hepatitis B core antibody , hepatitis C antibody, ANA, ferritin (267), iron saturation (37%), B12 (440), folate (8.1). Reticulocyte count was 3.6%.  LDH was 196 (98-192).  Urine revealed no protein.  LFTs were normal.  Albumen was 2.0 (low).  Abdomen and pelvic CT scan on 07/26/2015 revealed an area of peribronchovascular opacity in the central right middle lobe (new) suspicious for bronchopneumonia.  There was marked splenomegaly (30 cm x 12 cm x 23 cm, previously 22 cm x 8.7 cm x 16 cm).  There was a small infrarenal abdominal aortic aneurysm (2.6 cm).  Hand films revealed moderate periarticular osteopenia but no significant degenerative changes or erosive findings.  Bone marrow aspirate and biopsy on 07/27/2015 revealed a  hypercellular marrow for age (70-80%) with relative myeloid hyperplasia, mild multilineage dyspoiesis and no increased blasts.  There was mildly increased T-lymphocytes, nonspecific.  There was mild polytypic plasmocytosis.  There was mild to focally moderate increase in reticulin.  Storage iron was present.Flow cytometry revealed no significant myeloid/monocytic abnormalities or increase in blasts.  There was no monoclonality.  There was a relatively increased T-cell receptor gamma/delta+ T cells (9%), nonspecific.  Symptomatically, he notes waxing and waning mouth ulcers.  He has had no interval fevers or infections.  He has lost 24 pounds in the past 4 months.   Plan: 1.  Review hospital laboratory evaluation and bone marrow.  Bone marrow was similar to marrow performed at [facility].  Await cytogenetics.  Call about T cell receptor gene rearrangement studies.  Discuss lack of significant findings on hand films.  Discuss plan to follow-up with Dr. Sterling Big and Dr. Santiago Bumpers.  Discuss prior conversations about Rituxan  Discuss splenectomy. 2.  Follow-up pending marrow studies (cytogenetics) and T-cell gene rearrangement ([facility] [phone] ; Dr. Learta Codding).  r/o LGL leukemia. 3.  Review fever and neutropenia precautions. 4.  Chemotherapy teaching class. 5.  Phone follow-up with Dr. Gaylyn Cheers and Dr. Golda Acre. 6.  RTC in 1-2 weeks for MD assessment, labs (CBC with diff, CMP, Mg, LDH, uric acid) and week #1 Rituxan.   Pt never followed up with the social worker who was trying to get him rituxan and has not seen any other physician since then.  Pt states that his sx have been going  on for a few weeks.  He notes that he's been getting "choked" on food a lot lately.  He said that he can no longer eat solids.  He ate grits yesterday and even that became stuck.  It took 3.5 hrs to go down.  The pt said that his spleen is getting much bigger also.  He said that he has had a cough and some sob.         Past Medical History:  Diagnosis Date  . COPD (chronic obstructive pulmonary disease) ([place])   . Felty syndrome ([place])   . Pancytopenia ([place])   . Seropositive rheumatoid arthritis ([place])   . Tobacco use disorder     Patient Active Problem List   Diagnosis Date Noted  . Anemia 11/14/2015  . Protein-calorie malnutrition, severe 07/24/2015  . Pancytopenia ([place]) 07/23/2015  . 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 Leukopenia 10/21/2012  . 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 Abnormal EKG 10/21/2012  . Joint pain 10/21/2012  . Splenomegaly 10/21/2012    History reviewed. No pertinent surgical history.     Home Medications    Prior to Admission medications   Medication Sig Start Date End Date Taking? Authorizing Provider  predniSONE (DELTASONE) 10 MG tablet Label  & dispense according to the schedule below. 3 Pills PO for 1 day then, 2 Pills PO for 1 day, 1 Pills PO for 1 day then STOP. Patient taking differently: Take 10 mg by mouth daily with breakfast.  07/28/15  Yes Henreitta Leber, MD  sucralfate (CARAFATE) 1 g tablet Take 1 g by mouth 3 (three) times daily.    Yes Historical Provider, MD  doxycycline (VIBRAMYCIN) 100 MG capsule Take 1 capsule (100 mg total) by mouth 2 (two) times daily. Patient not taking: Reported on 11/14/2015 07/28/15   Henreitta Leber, MD    Family History Family History  Problem Relation Age of Onset  . CAD Brother   . COPD Brother     Social History Social History  Substance Use Topics  . Smoking status: Current Every Day Smoker    Packs/day: 1.00    Types: Cigarettes  . Smokeless tobacco: Not on file  . Alcohol use No     Allergies   Levaquin [levofloxacin in d5w] and Methotrexate   Review of Systems Review of Systems  Constitutional: Positive for appetite change and fatigue.  Respiratory: Positive for cough, choking, shortness of breath and wheezing.   Cardiovascular: Positive for chest pain.  Gastrointestinal: Positive for  abdominal pain.  All other systems reviewed and are negative.    Physical Exam Updated Vital Signs BP 101/64   Pulse 89   Temp 97.8 F (36.6 C) (Oral)   Resp 22   Ht 5\' 10"  (1.778 m)   Wt 160 lb (72.6 kg)   SpO2 99%   BMI 22.96 kg/m   Physical Exam  Constitutional: He is oriented to person, place, and time. He appears well-developed and well-nourished.  HENT:  Head: Normocephalic and atraumatic.  Right Ear: External ear normal.  Left Ear: External ear normal.  Nose: Nose normal.  Mouth/Throat: Mucous membranes are dry.  Eyes: Conjunctivae and EOM are normal. Pupils are equal, round, and reactive to light.  Neck: Normal range of motion. Neck supple.  Cardiovascular: Normal rate, regular rhythm, normal heart sounds and intact distal pulses.   Pulmonary/Chest: Effort normal. He has wheezes.  Abdominal: Soft. Bowel sounds are normal.  Marked splenomegaly.  Spleen goes almost to his left hip.  Umbilical hernia easily reducible.  Musculoskeletal: Normal range of motion.  Neurological: He is alert and oriented to person, place, and time.  Skin: Skin is warm and dry.  Psychiatric: He has a normal mood and affect. His behavior is normal. Judgment and thought content normal.  Nursing note and vitals reviewed.    ED Treatments / Results  Labs (all labs ordered are listed, but only abnormal results are displayed) Labs Reviewed  COMPREHENSIVE METABOLIC PANEL - Abnormal; Notable for the following:       Result Value   Sodium 129 (*)    Creatinine, Ser 0.55 (*)    Calcium 6.9 (*)    Albumin 2.0 (*)    AST 12 (*)    ALT 8 (*)    Anion gap 2 (*)    All other components within normal limits  CBC WITH DIFFERENTIAL/PLATELET - Abnormal; Notable for the following:    WBC 0.3 (*)    RBC 2.62 (*)    Hemoglobin 6.0 (*)    HCT 19.3 (*)    MCV 73.7 (*)    MCH 22.9 (*)    RDW 21.9 (*)    Platelets 39 (*)    Neutro Abs 0.0 (*)    Lymphs Abs 0.2 (*)    All other components  within normal limits  PROTIME-INR - Abnormal; Notable for the following:    Prothrombin Time 15.7 (*)    All other components within normal limits  URINALYSIS, ROUTINE W REFLEX MICROSCOPIC (NOT AT [facility]) - Abnormal; Notable for the following:    Specific Gravity, Urine <1.005 (*)    All other components within normal limits  LIPASE, BLOOD  TROPONIN I  LACTIC ACID, PLASMA  LACTIC ACID, PLASMA  TYPE AND SCREEN  PREPARE RBC (CROSSMATCH)  ABO/RH    EKG  EKG Interpretation  Date/Time:  Wednesday November 14 2015 16:47:29 EDT Ventricular Rate:  83 PR Interval:    QRS Duration: 96 QT Interval:  410 QTC Calculation: 482 R Axis:   -46 Text Interpretation:  Sinus rhythm LAD, consider left anterior fascicular block Borderline prolonged QT interval Confirmed by Gilford Raid MD, Cleatus Goodin ([id number]) on 11/14/2015 6:14:35 PM       Radiology Dg Chest 2 View  Result Date: 11/14/2015 CLINICAL DATA:  Chest pain. EXAM: CHEST  2 VIEW COMPARISON:  07/23/2015 FINDINGS: Heart size and pulmonary vascularity are normal and the lungs are clear. Nipple shadows at the lung bases. No acute bone abnormality. IMPRESSION: Normal chest. Electronically Signed   By: Lorriane Shire M.D.   On: 11/14/2015 18:36    Procedures Procedures (including critical care time)  Medications Ordered in ED Medications  0.9 %  sodium chloride infusion (not administered)  nicotine (NICODERM CQ - dosed in mg/24 hours) patch 21 mg (not administered)  sodium chloride 0.9 % bolus 1,000 mL (0 mLs Intravenous Stopped 11/14/15 1817)  ipratropium-albuterol (DUONEB) 0.5-2.5 (3) MG/3ML nebulizer solution 3 mL (3 mLs Nebulization Given 11/14/15 1721)  morphine 4 MG/ML injection 4 mg (4 mg Intravenous Given 11/14/15 1730)  ondansetron (ZOFRAN) injection 4 mg (4 mg Intravenous Given 11/14/15 1730)     Initial Impression / Assessment and Plan / ED Course  I have reviewed the triage vital signs and the nursing notes.  Pertinent labs & imaging results that  were available during my care of the patient were reviewed by me and considered in my medical decision making (see chart for details).  Clinical Course    Pt d/w Dr. Shanon Brow for  admission and she requested that I speak with oncology.  I spoke with Dr. Kirby Crigler who said pt can be treated here.  He recommended that pt receive 2-3 units of blood.  In the absence of active bleeding, he does not need platelets.  He would not recommend surgical intervention on spleen at this time.  Dr. Shanon Brow will admit pt to med surg.  Final Clinical Impressions(s) / ED Diagnoses   Final diagnoses:  Symptomatic anemia  Pancytopenia ([facility])  Felty syndrome ([place])  Tobacco abuse    New Prescriptions New Prescriptions   No medications on file     Isla Pence, MD 11/14/15 1929

## 2015-11-14 NOTE — ED Notes (Signed)
RT at bedside.

## 2015-11-14 NOTE — ED Notes (Signed)
Consent signed and is at bedside °

## 2015-11-15 ENCOUNTER — Encounter (HOSPITAL_COMMUNITY): Payer: Self-pay

## 2015-11-15 DIAGNOSIS — D61818 Other pancytopenia: Principal | ICD-10-CM

## 2015-11-15 DIAGNOSIS — D649 Anemia, unspecified: Secondary | ICD-10-CM

## 2015-11-15 DIAGNOSIS — M059 Rheumatoid arthritis with rheumatoid factor, unspecified: Secondary | ICD-10-CM

## 2015-11-15 DIAGNOSIS — E43 Unspecified severe protein-calorie malnutrition: Secondary | ICD-10-CM

## 2015-11-15 DIAGNOSIS — M0500 Felty's syndrome, unspecified site: Secondary | ICD-10-CM

## 2015-11-15 DIAGNOSIS — J449 Chronic obstructive pulmonary disease, unspecified: Secondary | ICD-10-CM

## 2015-11-15 LAB — BASIC METABOLIC PANEL
Anion gap: 4 — ABNORMAL LOW (ref 5–15)
BUN: 14 mg/dL (ref 6–20)
CALCIUM: 7.2 mg/dL — AB (ref 8.9–10.3)
CHLORIDE: 100 mmol/L — AB (ref 101–111)
CO2: 27 mmol/L (ref 22–32)
CREATININE: 0.49 mg/dL — AB (ref 0.61–1.24)
GFR calc non Af Amer: >60 mL/min (ref >60)
GLUCOSE: 92 mg/dL (ref 65–99)
Potassium: 4.1 mmol/L (ref 3.5–5.1)
Sodium: 131 mmol/L — ABNORMAL LOW (ref 135–145)

## 2015-11-15 LAB — CBC
HCT: 22.9 % — ABNORMAL LOW (ref 39.0–52.0)
Hemoglobin: 7.2 g/dL — ABNORMAL LOW (ref 13.0–17.0)
MCH: 23.5 pg — AB (ref 26.0–34.0)
MCHC: 31.4 g/dL (ref 30.0–36.0)
MCV: 74.6 fL — AB (ref 78.0–100.0)
PLATELETS: 46 10*3/uL — AB (ref 150–400)
RBC: 3.07 MIL/uL — ABNORMAL LOW (ref 4.22–5.81)
RDW: 21.2 % — ABNORMAL HIGH (ref 11.5–15.5)
WBC: 0.4 10*3/uL — CL (ref 4.0–10.5)

## 2015-11-15 LAB — PREPARE RBC (CROSSMATCH)

## 2015-11-15 MED ORDER — PREDNISONE 10 MG PO TABS
50.0000 mg | ORAL_TABLET | Freq: Every day | ORAL | Status: DC
  Administered 2015-11-15 – 2015-11-16 (×2): 50 mg via ORAL
  Filled 2015-11-15 (×2): qty 2

## 2015-11-15 MED ORDER — PREDNISONE 10 MG PO TABS
10.0000 mg | ORAL_TABLET | Freq: Every day | ORAL | Status: DC
  Administered 2015-11-15: 10 mg via ORAL
  Filled 2015-11-15: qty 1

## 2015-11-15 MED ORDER — NICOTINE 21 MG/24HR TD PT24
21.0000 mg | MEDICATED_PATCH | Freq: Every day | TRANSDERMAL | Status: DC
  Administered 2015-11-15: 21 mg via TRANSDERMAL
  Filled 2015-11-15: qty 1

## 2015-11-15 MED ORDER — ENSURE ENLIVE PO LIQD
237.0000 mL | Freq: Two times a day (BID) | ORAL | Status: DC
  Administered 2015-11-16 (×2): 237 mL via ORAL

## 2015-11-15 MED ORDER — ACETAMINOPHEN 325 MG PO TABS
650.0000 mg | ORAL_TABLET | Freq: Four times a day (QID) | ORAL | Status: DC | PRN
  Administered 2015-11-15 – 2015-11-16 (×2): 650 mg via ORAL
  Filled 2015-11-15 (×3): qty 2

## 2015-11-15 MED ORDER — SODIUM CHLORIDE 0.9 % IV SOLN: Freq: Once | INTRAVENOUS | Status: DC

## 2015-11-15 NOTE — Care Management Note (Signed)
Case Management Note  Patient Details  Name: Marcus [name] MRN: [id number] Date of Birth: 11/10/1960  Subjective/Objective:   Patient is from home with mother. No one at bedside during assessment. Patient states he does not have a PCP or insurance.                  Action/Plan: Anticipate DC home. List given of providers accepting new patients and will talk with [facility] about any possibilities of patient applying for Medicaid.    Expected Discharge Date:                  Expected Discharge Plan:  Home/Self Care  In-House Referral:  NA  Discharge planning Services  CM Consult  Post Acute Care Choice:  NA Choice offered to:  NA  DME Arranged:    DME Agency:     HH Arranged:    HH Agency:     Status of Service:  Completed, signed off  If discussed at [employer] of Stay Meetings, dates discussed:    Additional Comments:  Dameian Crisman, Chauncey Reading, RN 11/15/2015, 11:41 AM

## 2015-11-15 NOTE — Progress Notes (Signed)
PROGRESS NOTE    Marcus [name]  SJG:[id number] DOB: [birth year] DOA: 11/14/2015 PCP: No PCP Per Patient    Brief Narrative:  16 yom with a hx of COPD, rheumatoid arthritis,  feltys syndrome, and anemia presents with complaints of weakness. Pt was admitted to [facility] 08/2015 for pneumonia. Pt declined follow up treatment of RA. Patient also declined splenectomy. While in the ED it was noted that his platelets and hemoglobin were low. He was transfused 2 units of pRBCs. On admission he was also noted to be hypocalcemic and hyponatremic. Patient was admitted for further evaluation of pancytopenia.    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 Pancytopenia. Felt to be related to RA. In 03/2015 WBC was 0.8, Hgb 10.9, PLT 92. He has had a bone marrow biopsy at [facility] in the past, results of which are unknown. ? If neupogen would be helpful. Hematology consulted. 2. Rheumatoid arthritis. Chronically on prednisone. Was taking methotrexate until 04/2015 without significant improvement. He was followed by [facility] and was being considered for remicaide. They were in the process of trying to get the patient medicaid. 3. Splenomegaly. Has a history of RA and felt to have Felty syndrome, followed by [facility] in the past. Splenomegaly has been present for several years now. ?if splenectomy would be helpful 4. Anemia. S/p 2 units of prbc. No evidence of bleeding. Likely related to RA. Hemotology has been consulted.  5. Thrombocytopenia. 6. Hypocalcemia. When corrected for albumin, serum calcium is 8.8.   7. Hyponatremia. Possibly related to volume depletion. Improving. Continue IVFs. 8. COPD. Stable. Continue bronchodilators.  9. GERD. Continue PPI.  10. Protein-calorie  malnutrition. Nutrition following     DVT prophylaxis: SCD's Code Status: Full Family Communication: No family bedside Disposition Plan: Discharge home once improved    Consultants:   None   Procedures:   Transfused 2 units PBRCs 8/9  Antimicrobials:   None    Subjective: States he couldn't swallow upon arrival. No improvement in swallowing. States while eating egg this AM he started choking. He coughed the food back up. States he had right-sided "stabbing" pain while breathing or moving. Has taken prednisone '10mg'$  daily for several years.   Objective: Vitals:   11/14/15 2310 11/14/15 2335 11/15/15 0145 11/15/15 0611  BP: (!) 95/53 (!) 100/55 (!) 104/58 (!) 106/58  Pulse: 85 85 88 80  Resp: '20 20  17  '$ Temp: 97.5 F (36.4 C) 97.7 F (36.5 C) 97.7 F (36.5 C) 98.6 F (37 C)  TempSrc: Oral Axillary Axillary Oral  SpO2: 98% 100% 98% 99%  Weight:      Height:        Intake/Output Summary (Last 24 hours) at 11/15/15 0731 Last data filed at 11/15/15 0100  Gross per 24 hour  Intake             1560 ml  Output              400 ml  Net             1160 ml   Filed Weights   11/14/15 1653 11/14/15 2056  Weight: 72.6 kg (160 lb) 63.3 kg (139 lb 9.6 oz)    Examination:  General exam: Appears calm and comfortable  Respiratory system: Tenderness  under right ribs. Clear to auscultation. Respiratory effort normal. Cardiovascular system: S1 & S2 heard, RRR. No JVD, murmurs, rubs, gallops or clicks. No pedal edema. Gastrointestinal system: Abdomen is nondistended, soft and nontender. No organomegaly or masses felt. Normal bowel sounds heard. Central nervous system: Alert and oriented. No focal neurological deficits. Extremities: Symmetric 5 x 5 power. Skin: Ulcer over left anterior thigh. No rashes, lesions. Psychiatry: Judgement and insight appear normal. Mood & affect appropriate.     Data Reviewed: I have personally reviewed following labs and imaging  studies  CBC:  Recent Labs Lab 11/14/15 1730 11/15/15 0603  WBC 0.3* 0.4*  NEUTROABS 0.0*  --   HGB 6.0* 7.2*  HCT 19.3* 22.9*  MCV 73.7* 74.6*  PLT 39* 46*   Basic Metabolic Panel:  Recent Labs Lab 11/14/15 1730 11/15/15 0603  NA 129* 131*  K 4.4 4.1  CL 102 100*  CO2 25 27  GLUCOSE 95 92  BUN 17 14  CREATININE 0.55* 0.49*  CALCIUM 6.9* 7.2*   GFR: Estimated Creatinine Clearance: 94.5 mL/min (by C-G formula based on SCr of 0.8 mg/dL). Liver Function Tests:  Recent Labs Lab 11/14/15 1730  AST 12*  ALT 8*  ALKPHOS 54  BILITOT 0.8  PROT 6.8  ALBUMIN 2.0*    Recent Labs Lab 11/14/15 1730  LIPASE 40   No results for input(s): AMMONIA in the last 168 hours. Coagulation Profile:  Recent Labs Lab 11/14/15 1730  INR 1.24   Cardiac Enzymes:  Recent Labs Lab 11/14/15 1730  TROPONINI <0.03   BNP (last 3 results) No results for input(s): PROBNP in the last 8760 hours. HbA1C: No results for input(s): HGBA1C in the last 72 hours. CBG: No results for input(s): GLUCAP in the last 168 hours. Lipid Profile: No results for input(s): CHOL, HDL, LDLCALC, TRIG, CHOLHDL, LDLDIRECT in the last 72 hours. Thyroid Function Tests: No results for input(s): TSH, T4TOTAL, FREET4, T3FREE, THYROIDAB in the last 72 hours. Anemia Panel: No results for input(s): VITAMINB12, FOLATE, FERRITIN, TIBC, IRON, RETICCTPCT in the last 72 hours. Sepsis Labs:  Recent Labs Lab 11/14/15 1730  LATICACIDVEN 0.7    No results found for this or any previous visit (from the past 240 hour(s)).       Radiology Studies: Dg Chest 2 View  Result Date: 11/14/2015 CLINICAL DATA:  Chest pain. EXAM: CHEST  2 VIEW COMPARISON:  07/23/2015 FINDINGS: Heart size and pulmonary vascularity are normal and the lungs are clear. Nipple shadows at the lung bases. No acute bone abnormality. IMPRESSION: Normal chest. Electronically Signed   By: Lorriane Shire M.D.   On: 11/14/2015 18:36   Ct Chest  W Contrast  Result Date: 11/14/2015 CLINICAL DATA:  55 year old male with abdominal and pelvic pain with weakness. History of rheumatoid arthritis and Felty syndrome. EXAM: CT CHEST, ABDOMEN, AND PELVIS WITH CONTRAST TECHNIQUE: Multidetector CT imaging of the chest, abdomen and pelvis was performed following the standard protocol during bolus administration of intravenous contrast. CONTRAST:  18m ISOVUE-300 IOPAMIDOL (ISOVUE-300) INJECTION 61% COMPARISON:  07/26/2015 abdomen and pelvic CT. FINDINGS: CT CHEST FINDINGS Cardiovascular: Heart size is within normal limits. Moderate coronary artery calcifications again noted. There is no evidence of thoracic aortic aneurysm. Mediastinum/Nodes: No mediastinal mass, enlarged lymph nodes or pericardial effusion noted. A small hiatal hernia is noted with unchanged mild circumferential distal esophageal wall thickening. Lungs/Pleura: Moderate centrilobular emphysema again noted. There is no evidence of airspace disease, consolidation, nodule, mass, pleural effusion or pneumothorax. Musculoskeletal: No acute  or suspicious abnormalities. CT ABDOMEN PELVIS FINDINGS Hepatobiliary: The liver and gallbladder are unremarkable. There is no evidence of biliary dilatation. Pancreas: Unremarkable Spleen: Marked splenomegaly is unchanged with the spleen measuring 12 x 22 x 30 cm (volume of 3400 cc). No definite focal splenic lesions or infarcts identified. Adrenals/Urinary Tract: Mass effect on the left kidney from the markedly enlarged spleen again noted. The kidneys are otherwise unremarkable. The adrenal glands and bladder are unremarkable. Stomach/Bowel: No evidence of bowel obstruction or definite small bowel/colonic wall thickening. The appendix is normal. Vascular/Lymphatic: Abdominal aortic atherosclerotic calcifications noted without aneurysm. No enlarged lymph nodes are identified. Reproductive: Unremarkable Other: A small right inguinal hernia is noted with a loop of small  bowel extending towards the hernia. There is no evidence of free fluid, abscess or pneumoperitoneum. Musculoskeletal: No acute or suspicious abnormalities. IMPRESSION: No evidence of new or acute abnormality. Unchanged marked splenomegaly (volume of 3400 cc) with mass effect on the adjacent left kidney. Small hiatal hernia with unchanged mild circumferential distal esophageal wall thickening/esophagitis. Abdominal aortic atherosclerosis. Moderate centrilobular emphysema. Coronary artery disease. Electronically Signed   By: Margarette Canada M.D.   On: 11/14/2015 22:24   Ct Abdomen Pelvis W Contrast  Result Date: 11/14/2015 CLINICAL DATA:  55 year old male with abdominal and pelvic pain with weakness. History of rheumatoid arthritis and Felty syndrome. EXAM: CT CHEST, ABDOMEN, AND PELVIS WITH CONTRAST TECHNIQUE: Multidetector CT imaging of the chest, abdomen and pelvis was performed following the standard protocol during bolus administration of intravenous contrast. CONTRAST:  153m ISOVUE-300 IOPAMIDOL (ISOVUE-300) INJECTION 61% COMPARISON:  07/26/2015 abdomen and pelvic CT. FINDINGS: CT CHEST FINDINGS Cardiovascular: Heart size is within normal limits. Moderate coronary artery calcifications again noted. There is no evidence of thoracic aortic aneurysm. Mediastinum/Nodes: No mediastinal mass, enlarged lymph nodes or pericardial effusion noted. A small hiatal hernia is noted with unchanged mild circumferential distal esophageal wall thickening. Lungs/Pleura: Moderate centrilobular emphysema again noted. There is no evidence of airspace disease, consolidation, nodule, mass, pleural effusion or pneumothorax. Musculoskeletal: No acute or suspicious abnormalities. CT ABDOMEN PELVIS FINDINGS Hepatobiliary: The liver and gallbladder are unremarkable. There is no evidence of biliary dilatation. Pancreas: Unremarkable Spleen: Marked splenomegaly is unchanged with the spleen measuring 12 x 22 x 30 cm (volume of 3400 cc). No  definite focal splenic lesions or infarcts identified. Adrenals/Urinary Tract: Mass effect on the left kidney from the markedly enlarged spleen again noted. The kidneys are otherwise unremarkable. The adrenal glands and bladder are unremarkable. Stomach/Bowel: No evidence of bowel obstruction or definite small bowel/colonic wall thickening. The appendix is normal. Vascular/Lymphatic: Abdominal aortic atherosclerotic calcifications noted without aneurysm. No enlarged lymph nodes are identified. Reproductive: Unremarkable Other: A small right inguinal hernia is noted with a loop of small bowel extending towards the hernia. There is no evidence of free fluid, abscess or pneumoperitoneum. Musculoskeletal: No acute or suspicious abnormalities. IMPRESSION: No evidence of new or acute abnormality. Unchanged marked splenomegaly (volume of 3400 cc) with mass effect on the adjacent left kidney. Small hiatal hernia with unchanged mild circumferential distal esophageal wall thickening/esophagitis. Abdominal aortic atherosclerosis. Moderate centrilobular emphysema. Coronary artery disease. Electronically Signed   By: JMargarette CanadaM.D.   On: 11/14/2015 22:24        Scheduled Meds: . nicotine  21 mg Transdermal Once  . sodium chloride flush  3 mL Intravenous Q12H   Continuous Infusions:    LOS: 1 day    Time spent: 25 minutes     JKathie Dike MD  [facility] If 7PM-7AM, please contact night-coverage [email or web address] Password [email or web address] 11/15/2015, 7:31 AM   By signing my name below, I, Collene Leyden, attest that this documentation has been prepared under the direction and in the presence of Kathie Dike, MD. Electronically signed: Collene Leyden, Scribe. 11/15/15 11:59 AM  I, Dr. Kathie Dike, personally performed the services described in this documentaiton. All medical record entries made by the scribe were at my direction and in my presence. I have reviewed the chart and agree that the record  reflects my personal performance and is accurate and complete  Kathie Dike, MD, 11/15/2015 2:56 PM

## 2015-11-16 DIAGNOSIS — R161 Splenomegaly, not elsewhere classified: Secondary | ICD-10-CM

## 2015-11-16 LAB — TYPE AND SCREEN
ABO/RH(D): A POS
Antibody Screen: NEGATIVE
UNIT DIVISION: 0
UNIT DIVISION: 0
Unit division: 0

## 2015-11-16 LAB — CBC
HCT: 28.4 % — ABNORMAL LOW (ref 39.0–52.0)
Hemoglobin: 9 g/dL — ABNORMAL LOW (ref 13.0–17.0)
MCH: 23.7 pg — AB (ref 26.0–34.0)
MCHC: 31.7 g/dL (ref 30.0–36.0)
MCV: 74.7 fL — ABNORMAL LOW (ref 78.0–100.0)
PLATELETS: 40 10*3/uL — AB (ref 150–400)
RBC: 3.8 MIL/uL — AB (ref 4.22–5.81)
RDW: 21.1 % — ABNORMAL HIGH (ref 11.5–15.5)
WBC: 0.4 10*3/uL — AB (ref 4.0–10.5)

## 2015-11-16 MED ORDER — PREDNISONE 20 MG PO TABS: ORAL_TABLET | ORAL | 0 refills | Status: DC

## 2015-11-16 NOTE — Progress Notes (Signed)
VSS and d/c instructions and meds reviewed w/pt and his brother.  They have no questions at this time.  2 IVs removed and both sites are WNL.  Pt is discharged home.

## 2015-11-16 NOTE — Progress Notes (Signed)
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   GOAL:   Patient will meet greater than or equal to 90% of their needs  MONITOR:   PO intake, Supplement acceptance, Labs, Weight trends  REASON FOR ASSESSMENT:   Consult Assessment of nutrition requirement/status  ASSESSMENT:   Marcus [name] is a 55 y.o. male with medical history significant of rheumatoid arthritis, Feltys syndrome, copd comes in with months of not feeling well.  Pt was seen at [place] in may of this year, for pna, was advised to follow up and start treatment for his RA however he declined this treatment because it "was rat poison".  Per his records he was suppose to be started on remicade.  It has been suggested to him also that he would need a splenectomy but he has also declined this in the past. He was diagnosed with severe malnutrition back in April when his weight was 161.9# since then he has lost an additional 13%.  Patients chief nutrition related complaint was swallow difficulty. He says it limits his ability to eat the foods he normally would such as beef or grits. Coughs with these foods and says he hasn't eat been since December. His meal intake was very poor yesterday but today is much better. 10-50% vs 75-100% . We talked about the importance of getting his nutrition in especially protein with foods such as eggs, tuna with mayo, chopped chicken with gravy and so on. He likes Ensure and is willing to drink those. Encouraged him to drink at least 2 daily to prevent further weight loss. Nutrition-Focused physical exam completed.  Findings are moderate/severe fat depletion, moderate/severe muscle depletion, and no edema.     Recent Labs Lab 11/14/15 1730 11/15/15 0603  NA 129* 131*  K 4.4 4.1  CL 102 100*  CO2 25 27  BUN 17 14  CREATININE 0.55* 0.49*  CALCIUM 6.9* 7.2*  GLUCOSE 95 92   Labs and Meds reviewed  Diet Order:  Diet Heart Room service appropriate? Yes; Fluid consistency: Thin Diet Heart Room service appropriate? Yes; Fluid consistency: Thin Diet - low sodium heart healthy  Skin:   intact  Last BM:  11/14/15 soft  Height:   Ht Readings from Last 1 Encounters:  11/14/15 5\' 10"  (1.778 m)    Weight:   Wt Readings from Last 1 Encounters:  11/14/15 139 lb 9.6 oz (63.3 kg)    Ideal Body Weight:  75 kg  BMI:  Body mass index is 20.03 kg/m.  Estimated Nutritional Needs:   Kcal:  [id number]  Protein:  82-90 gr  Fluid:  1.9-2.2 liters daily  EDUCATION NEEDS:   Education needs addressed  Colman Cater MS,RD,CSG,LDN Office: [phone] Pager: [phone]

## 2015-11-16 NOTE — Discharge Summary (Signed)
Physician Discharge Summary  Marcus [name] [id number] DOB: May 22, 1960 DOA: 11/14/2015  PCP: No PCP Per Patient  Admit date: 11/14/2015 Discharge date: 11/16/2015  Admitted From: home Disposition:  home  Recommendations for Outpatient Follow-up:  1. Follow up with PCP in 1-2 weeks 2. Repeat cbc in 1 week 3. Follow up with Rheumatologist Dr. Santiago Bumpers at [facility] on 01/14/16  Home Health: Equipment/Devices:  Discharge Condition: stable CODE STATUS: full code Diet recommendation: regular diet  Brief/Interim Summary: This patient presents to the hospital with complaints of generalized weakness. He has a known history of rheumatoid arthritis, was diagnosed with Felty's syndrome, chronic splenomegaly and pancytopenia. He is followed at the rheumatology clinic at [facility]. He was previously on methotrexate until 04/2015 when he stopped taking that medication. He reports that he is chronically on prednisone, but has not been on any DMARDS. He was evaluated in the emergency room and noted to have pancytopenia with a hemoglobin of 6.0. He did not have any evidence of bleeding. Case was discussed with hematology who recommended transfusion of PRBCs. It was felt that his pancytopenia was likely related to his rheumatoid arthritis. Lab values from [facility] reviewed and it was noted that his leukopenia/thrombocytopenia or chronic in nature. Anemia was more pronounced during this hospital stay. He was transfused 3 units of PRBCs with improvement in hemoglobin to 9. He is feeling significantly improved. Abdomen was imaged and the splenomegaly was felt to be unchanged from prior imaging. He did complain of generalized weakness and diffuse arthralgias. He was started on a high-dose of prednisone with improvement of his symptoms. He was placed on prednisone taper. It appears that the patient is back to his baseline level of functioning. Appointment has been made for him to follow-up with his primary  rheumatologist to discuss further management. Patient is otherwise stable for discharge.  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[place]) Active Problems:   Splenomegaly   Pancytopenia (HCC)   Protein-calorie malnutrition, severe   Anemia   Seropositive rheumatoid arthritis ([place])   Chronic obstructive pulmonary disease (HCC)    Discharge Instructions  Discharge Instructions    Diet - low sodium heart healthy    Complete by:  As directed   Increase activity slowly    Complete by:  As directed       Medication List    STOP taking these medications   doxycycline 100 MG capsule Commonly known as:  VIBRAMYCIN     TAKE these medications   predniSONE 20 MG tablet Commonly known as:  DELTASONE Take 40mg  po daily for 1 week then decrease by 10mg  every week until taking chronic dose of 10mg  daily. What changed:  medication strength  additional instructions   sucralfate 1 g tablet Commonly known as:  CARAFATE Take 1 g by mouth 3 (three) times daily.      Follow-up Information    Pamella Pert, MD Follow up on 01/14/2016.   Specialties:  Internal Medicine, Rheumatology Why:  4:00pm Contact information: [place] [address] [phone]          Allergies  Allergen Reactions  . Levaquin [Levofloxacin In D5w] Other (See Comments)    Chest pain  . Methotrexate Other (See Comments)    Chest pain    Consultations:  hematology   Procedures/Studies: Dg Chest 2 View  Result Date: 11/14/2015 CLINICAL DATA:  Chest pain. EXAM: CHEST  2 VIEW COMPARISON:  07/23/2015 FINDINGS: Heart size  and pulmonary vascularity are normal and the lungs are clear. Nipple shadows at the lung bases. No acute bone abnormality. IMPRESSION: Normal chest. Electronically Signed   By: Lorriane Shire M.D.   On: 11/14/2015 18:36   Ct Chest W Contrast  Result Date: 11/14/2015 CLINICAL DATA:  55 year old male with abdominal and pelvic pain with weakness.  History of rheumatoid arthritis and Felty syndrome. EXAM: CT CHEST, ABDOMEN, AND PELVIS WITH CONTRAST TECHNIQUE: Multidetector CT imaging of the chest, abdomen and pelvis was performed following the standard protocol during bolus administration of intravenous contrast. CONTRAST:  136mL ISOVUE-300 IOPAMIDOL (ISOVUE-300) INJECTION 61% COMPARISON:  07/26/2015 abdomen and pelvic CT. FINDINGS: CT CHEST FINDINGS Cardiovascular: Heart size is within normal limits. Moderate coronary artery calcifications again noted. There is no evidence of thoracic aortic aneurysm. Mediastinum/Nodes: No mediastinal mass, enlarged lymph nodes or pericardial effusion noted. A small hiatal hernia is noted with unchanged mild circumferential distal esophageal wall thickening. Lungs/Pleura: Moderate centrilobular emphysema again noted. There is no evidence of airspace disease, consolidation, nodule, mass, pleural effusion or pneumothorax. Musculoskeletal: No acute or suspicious abnormalities. CT ABDOMEN PELVIS FINDINGS Hepatobiliary: The liver and gallbladder are unremarkable. There is no evidence of biliary dilatation. Pancreas: Unremarkable Spleen: Marked splenomegaly is unchanged with the spleen measuring 12 x 22 x 30 cm (volume of 3400 cc). No definite focal splenic lesions or infarcts identified. Adrenals/Urinary Tract: Mass effect on the left kidney from the markedly enlarged spleen again noted. The kidneys are otherwise unremarkable. The adrenal glands and bladder are unremarkable. Stomach/Bowel: No evidence of bowel obstruction or definite small bowel/colonic wall thickening. The appendix is normal. Vascular/Lymphatic: Abdominal aortic atherosclerotic calcifications noted without aneurysm. No enlarged lymph nodes are identified. Reproductive: Unremarkable Other: A small right inguinal hernia is noted with a loop of small bowel extending towards the hernia. There is no evidence of free fluid, abscess or pneumoperitoneum. Musculoskeletal:  No acute or suspicious abnormalities. IMPRESSION: No evidence of new or acute abnormality. Unchanged marked splenomegaly (volume of 3400 cc) with mass effect on the adjacent left kidney. Small hiatal hernia with unchanged mild circumferential distal esophageal wall thickening/esophagitis. Abdominal aortic atherosclerosis. Moderate centrilobular emphysema. Coronary artery disease. Electronically Signed   By: Margarette Canada M.D.   On: 11/14/2015 22:24   Ct Abdomen Pelvis W Contrast  Result Date: 11/14/2015 CLINICAL DATA:  55 year old male with abdominal and pelvic pain with weakness. History of rheumatoid arthritis and Felty syndrome. EXAM: CT CHEST, ABDOMEN, AND PELVIS WITH CONTRAST TECHNIQUE: Multidetector CT imaging of the chest, abdomen and pelvis was performed following the standard protocol during bolus administration of intravenous contrast. CONTRAST:  158mL ISOVUE-300 IOPAMIDOL (ISOVUE-300) INJECTION 61% COMPARISON:  07/26/2015 abdomen and pelvic CT. FINDINGS: CT CHEST FINDINGS Cardiovascular: Heart size is within normal limits. Moderate coronary artery calcifications again noted. There is no evidence of thoracic aortic aneurysm. Mediastinum/Nodes: No mediastinal mass, enlarged lymph nodes or pericardial effusion noted. A small hiatal hernia is noted with unchanged mild circumferential distal esophageal wall thickening. Lungs/Pleura: Moderate centrilobular emphysema again noted. There is no evidence of airspace disease, consolidation, nodule, mass, pleural effusion or pneumothorax. Musculoskeletal: No acute or suspicious abnormalities. CT ABDOMEN PELVIS FINDINGS Hepatobiliary: The liver and gallbladder are unremarkable. There is no evidence of biliary dilatation. Pancreas: Unremarkable Spleen: Marked splenomegaly is unchanged with the spleen measuring 12 x 22 x 30 cm (volume of 3400 cc). No definite focal splenic lesions or infarcts identified. Adrenals/Urinary Tract: Mass effect on the left kidney from the  markedly enlarged spleen again noted.  The kidneys are otherwise unremarkable. The adrenal glands and bladder are unremarkable. Stomach/Bowel: No evidence of bowel obstruction or definite small bowel/colonic wall thickening. The appendix is normal. Vascular/Lymphatic: Abdominal aortic atherosclerotic calcifications noted without aneurysm. No enlarged lymph nodes are identified. Reproductive: Unremarkable Other: A small right inguinal hernia is noted with a loop of small bowel extending towards the hernia. There is no evidence of free fluid, abscess or pneumoperitoneum. Musculoskeletal: No acute or suspicious abnormalities. IMPRESSION: No evidence of new or acute abnormality. Unchanged marked splenomegaly (volume of 3400 cc) with mass effect on the adjacent left kidney. Small hiatal hernia with unchanged mild circumferential distal esophageal wall thickening/esophagitis. Abdominal aortic atherosclerosis. Moderate centrilobular emphysema. Coronary artery disease. Electronically Signed   By: Margarette Canada M.D.   On: 11/14/2015 22:24       Subjective: Overall joint pain is improving since admission, generalized weakness is improving.  Discharge Exam: Vitals:   11/16/15 0533 11/16/15 1403  BP: 109/67 125/82  Pulse: 68 74  Resp: 14 18  Temp: 97.4 F (36.3 C) 97.4 F (36.3 C)   Vitals:   11/15/15 2110 11/15/15 2355 11/16/15 0533 11/16/15 1403  BP: (!) 102/53 115/66 109/67 125/82  Pulse: 93 75 68 74  Resp: (!) 22 18 14 18   Temp: 98.2 F (36.8 C) 97.6 F (36.4 C) 97.4 F (36.3 C) 97.4 F (36.3 C)  TempSrc: Oral Oral Oral Oral  SpO2: 99% 100% 98% 99%  Weight:      Height:        General: Pt is alert, awake, not in acute distress Cardiovascular: RRR, S1/S2 +, no rubs, no gallops Respiratory: CTA bilaterally, no wheezing, no rhonchi,  Abdominal: Soft, NT, bowel sounds +, enlarged spleen noted in left abdomen Extremities: no edema, no cyanosis    The results of significant diagnostics from  this hospitalization (including imaging, microbiology, ancillary and laboratory) are listed below for reference.     Microbiology: No results found for this or any previous visit (from the past 240 hour(s)).   Labs: BNP (last 3 results) No results for input(s): BNP in the last 8760 hours. Basic Metabolic Panel:  Recent Labs Lab 11/14/15 1730 11/15/15 0603  NA 129* 131*  K 4.4 4.1  CL 102 100*  CO2 25 27  GLUCOSE 95 92  BUN 17 14  CREATININE 0.55* 0.49*  CALCIUM 6.9* 7.2*   Liver Function Tests:  Recent Labs Lab 11/14/15 1730  AST 12*  ALT 8*  ALKPHOS 54  BILITOT 0.8  PROT 6.8  ALBUMIN 2.0*    Recent Labs Lab 11/14/15 1730  LIPASE 40   No results for input(s): AMMONIA in the last 168 hours. CBC:  Recent Labs Lab 11/14/15 1730 11/15/15 0603 11/16/15 0930  WBC 0.3* 0.4* 0.4*  NEUTROABS 0.0*  --   --   HGB 6.0* 7.2* 9.0*  HCT 19.3* 22.9* 28.4*  MCV 73.7* 74.6* 74.7*  PLT 39* 46* 40*   Cardiac Enzymes:  Recent Labs Lab 11/14/15 1730  TROPONINI <0.03   BNP: Invalid input(s): POCBNP CBG: No results for input(s): GLUCAP in the last 168 hours. D-Dimer No results for input(s): DDIMER in the last 72 hours. Hgb A1c No results for input(s): HGBA1C in the last 72 hours. Lipid Profile No results for input(s): CHOL, HDL, LDLCALC, TRIG, CHOLHDL, LDLDIRECT in the last 72 hours. Thyroid function studies No results for input(s): TSH, T4TOTAL, T3FREE, THYROIDAB in the last 72 hours.  Invalid input(s): FREET3 Anemia work up No results for  input(s): VITAMINB12, FOLATE, FERRITIN, TIBC, IRON, RETICCTPCT in the last 72 hours. Urinalysis    Component Value Date/Time   COLORURINE YELLOW 11/14/2015 1710   APPEARANCEUR CLEAR 11/14/2015 1710   LABSPEC <1.005 (L) 11/14/2015 1710   PHURINE 6.0 11/14/2015 1710   GLUCOSEU NEGATIVE 11/14/2015 1710   HGBUR NEGATIVE 11/14/2015 1710   BILIRUBINUR NEGATIVE 11/14/2015 1710   KETONESUR NEGATIVE 11/14/2015 1710    PROTEINUR NEGATIVE 11/14/2015 1710   UROBILINOGEN 0.2 10/21/2012 1737   NITRITE NEGATIVE 11/14/2015 1710   LEUKOCYTESUR NEGATIVE 11/14/2015 1710   Sepsis Labs Invalid input(s): PROCALCITONIN,  WBC,  LACTICIDVEN Microbiology No results found for this or any previous visit (from the past 240 hour(s)).   Time coordinating discharge: Over 30 minutes  SIGNED:   Kathie Dike, MD  [facility] 11/16/2015, 4:09 PM Pager [phone]  If 7PM-7AM, please contact night-coverage [email or web address] Password [email or web address]

## 2015-11-16 NOTE — Care Management Note (Signed)
Case Management Note  Patient Details  Name: Marcus [name] MRN: [id number] Date of Birth: 1960-05-22   Expected Discharge Date:    11/17/2015              Expected Discharge Plan:  Home/Self Care  In-House Referral:  NA  Discharge planning Services  CM Consult  Post Acute Care Choice:  NA Choice offered to:  NA  DME Arranged:    DME Agency:     HH Arranged:    [place] Agency:     Status of Service:  Completed, signed off  If discussed at [employer] of Stay Meetings, dates discussed:    Additional Comments: CM offered to make f/u appointment with agnecy of choice from list provided previously by [name] (CM) Pt refused and states he just wants to f/u with his specialist for his Felty's Syndrome (which he see every 4 months). Pt encouraged to f/u with applications for disability and medicaid.   Sherald Barge, RN 11/16/2015, 1:31 PM

## 2016-01-08 ENCOUNTER — Other Ambulatory Visit
Admission: RE | Admit: 2016-01-08 | Discharge: 2016-01-08 | Disposition: A | Discharge status: Home / Self Care | Payer: Self-pay | Source: Ambulatory Visit | Attending: Internal Medicine | Admitting: Internal Medicine

## 2016-01-08 DIAGNOSIS — M0500 Felty's syndrome, unspecified site: Secondary | ICD-10-CM | POA: Insufficient documentation

## 2016-01-08 LAB — CBC WITH DIFFERENTIAL/PLATELET
BASOS ABS: 0 10*3/uL (ref 0–0.1)
BASOS PCT: 2 %
EOS ABS: 0 10*3/uL (ref 0–0.7)
Eosinophils Relative: 2 %
HCT: 37 % — ABNORMAL LOW (ref 40.0–52.0)
HEMOGLOBIN: 11.9 g/dL — AB (ref 13.0–18.0)
LYMPHS PCT: 55 %
Lymphs Abs: 1 10*3/uL (ref 1.0–3.6)
MCH: 25.7 pg — AB (ref 26.0–34.0)
MCHC: 32 g/dL (ref 32.0–36.0)
MCV: 80.2 fL (ref 80.0–100.0)
MONO ABS: 0.6 10*3/uL (ref 0.2–1.0)
Monocytes Relative: 32 %
NEUTROS ABS: 0.2 10*3/uL — AB (ref 1.4–6.5)
NEUTROS PCT: 9 %
PLATELETS: 488 10*3/uL — AB (ref 150–440)
RBC: 4.62 MIL/uL (ref 4.40–5.90)
RDW: 18.2 % — AB (ref 11.5–14.5)
WBC: 1.8 10*3/uL — ABNORMAL LOW (ref 3.8–10.6)

## 2017-03-03 ENCOUNTER — Emergency Department
Admission: EM | Admit: 2017-03-03 | Discharge: 2017-03-03 | Disposition: A | Discharge status: Home / Self Care | Payer: Medicaid Other | Attending: Emergency Medicine | Admitting: Emergency Medicine

## 2017-03-03 ENCOUNTER — Encounter: Payer: Self-pay | Admitting: Emergency Medicine

## 2017-03-03 ENCOUNTER — Emergency Department: Payer: Medicaid Other

## 2017-03-03 ENCOUNTER — Other Ambulatory Visit: Payer: Self-pay

## 2017-03-03 DIAGNOSIS — J189 Pneumonia, unspecified organism: Secondary | ICD-10-CM

## 2017-03-03 DIAGNOSIS — J449 Chronic obstructive pulmonary disease, unspecified: Secondary | ICD-10-CM | POA: Insufficient documentation

## 2017-03-03 DIAGNOSIS — R531 Weakness: Secondary | ICD-10-CM

## 2017-03-03 DIAGNOSIS — F17210 Nicotine dependence, cigarettes, uncomplicated: Secondary | ICD-10-CM | POA: Diagnosis not present

## 2017-03-03 DIAGNOSIS — M6281 Muscle weakness (generalized): Secondary | ICD-10-CM | POA: Diagnosis not present

## 2017-03-03 DIAGNOSIS — Z79899 Other long term (current) drug therapy: Secondary | ICD-10-CM | POA: Diagnosis not present

## 2017-03-03 LAB — URINALYSIS, COMPLETE (UACMP) WITH MICROSCOPIC
BACTERIA UA: NONE SEEN
BILIRUBIN URINE: NEGATIVE
GLUCOSE, UA: NEGATIVE mg/dL
HGB URINE DIPSTICK: NEGATIVE
Ketones, ur: NEGATIVE mg/dL
LEUKOCYTES UA: NEGATIVE
NITRITE: NEGATIVE
PROTEIN: 30 mg/dL — AB
RBC / HPF: NONE SEEN RBC/hpf (ref 0–5)
SPECIFIC GRAVITY, URINE: 1.027 (ref 1.005–1.030)
Squamous Epithelial / LPF: NONE SEEN
pH: 5 (ref 5.0–8.0)

## 2017-03-03 LAB — BASIC METABOLIC PANEL
ANION GAP: 11 (ref 5–15)
BUN: 23 mg/dL — AB (ref 6–20)
CHLORIDE: 99 mmol/L — AB (ref 101–111)
CO2: 24 mmol/L (ref 22–32)
Calcium: 8.7 mg/dL — ABNORMAL LOW (ref 8.9–10.3)
Creatinine, Ser: 0.74 mg/dL (ref 0.61–1.24)
Glucose, Bld: 158 mg/dL — ABNORMAL HIGH (ref 65–99)
POTASSIUM: 3.7 mmol/L (ref 3.5–5.1)
SODIUM: 134 mmol/L — AB (ref 135–145)

## 2017-03-03 LAB — CBC
HEMATOCRIT: 46.2 % (ref 40.0–52.0)
HEMOGLOBIN: 15 g/dL (ref 13.0–18.0)
MCH: 25.2 pg — ABNORMAL LOW (ref 26.0–34.0)
MCHC: 32.4 g/dL (ref 32.0–36.0)
MCV: 78 fL — AB (ref 80.0–100.0)
Platelets: 277 10*3/uL (ref 150–440)
RBC: 5.93 MIL/uL — AB (ref 4.40–5.90)
RDW: 16.1 % — AB (ref 11.5–14.5)
WBC: 2.6 10*3/uL — AB (ref 3.8–10.6)

## 2017-03-03 LAB — TROPONIN I: Troponin I: <0.03 ng/mL (ref <0.03)

## 2017-03-03 IMAGING — CR DG CHEST 2V
2 series · 2 of 2 positions shown · non-contrast
Comparison: Chest x-ray dated [DATE].

CLINICAL DATA: Cough and shortness of breath.

EXAM:
CHEST  2 VIEW

[chest pa]
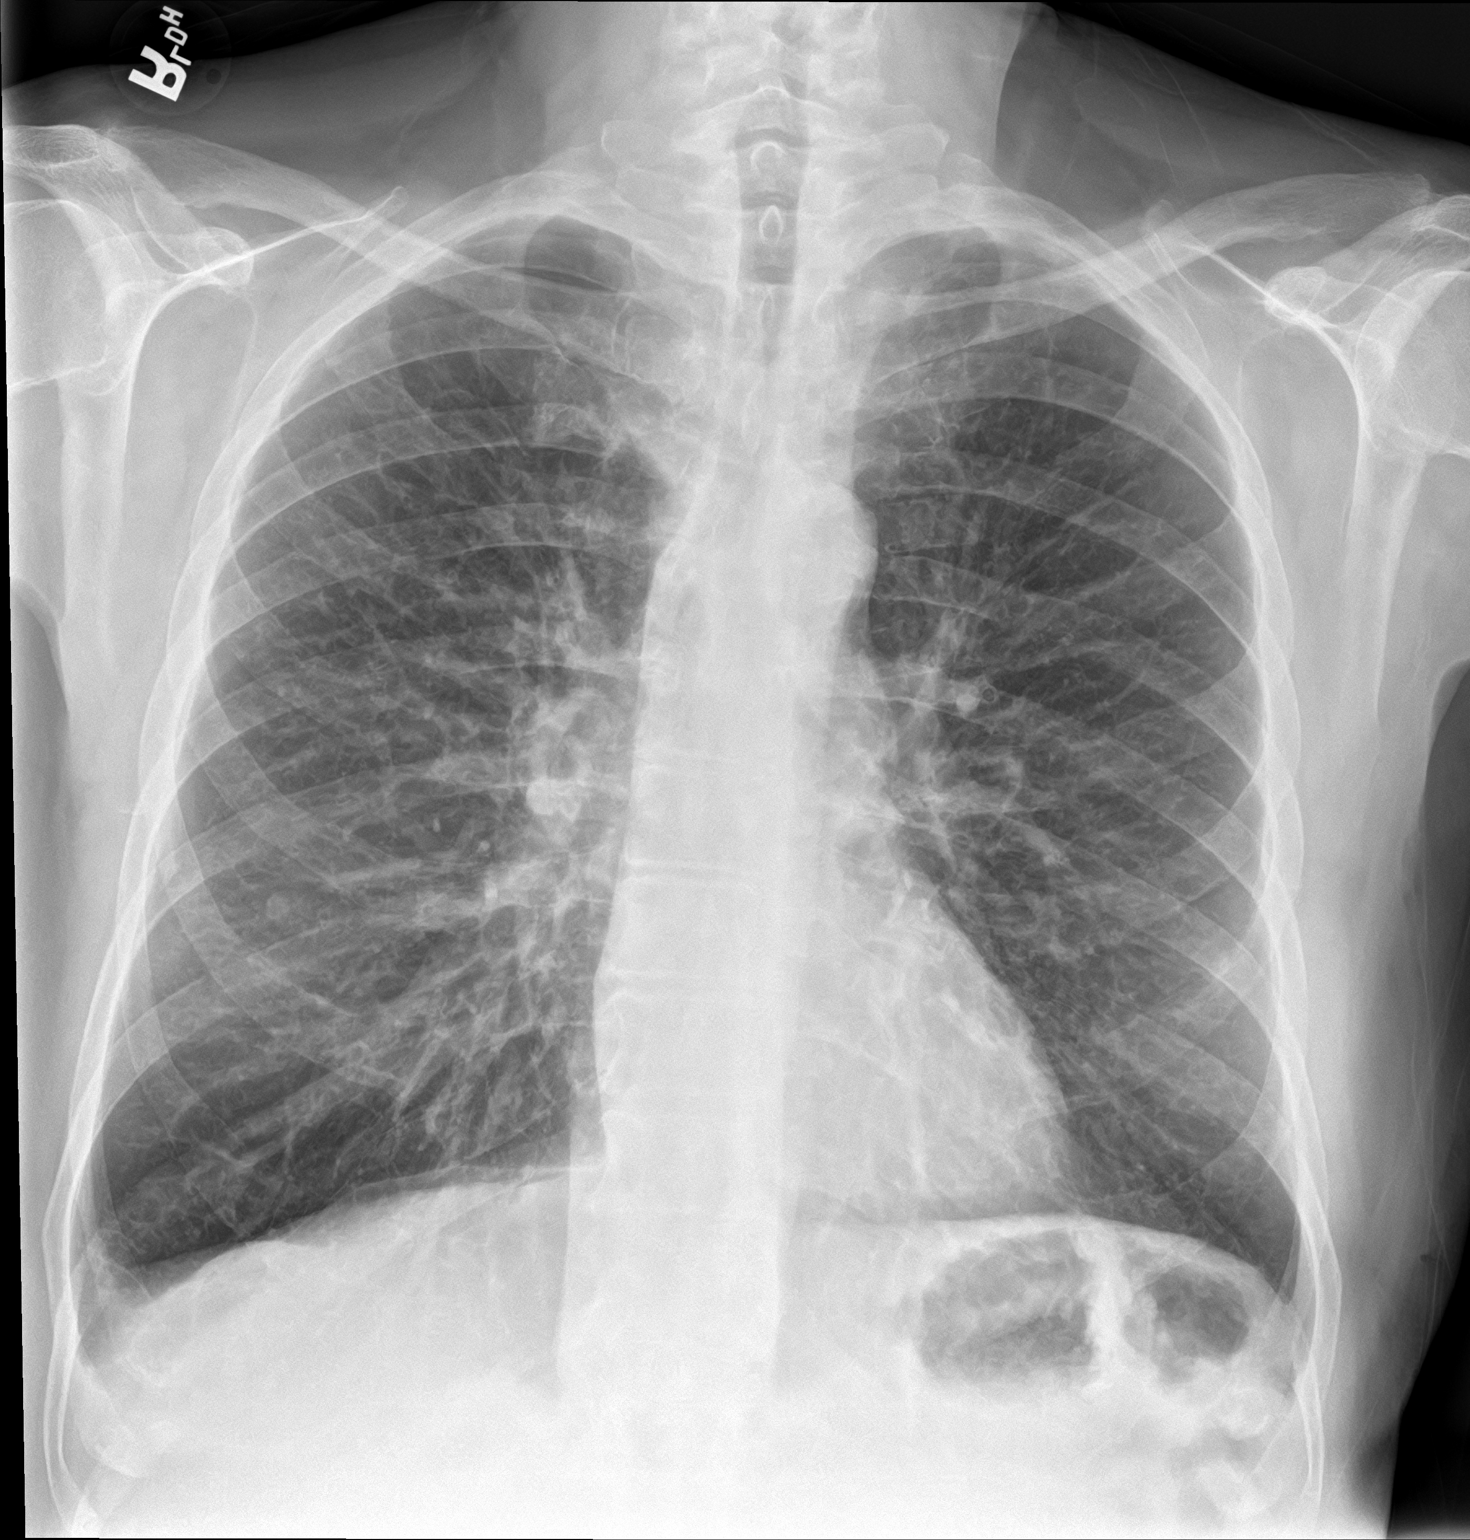

[chest lat]
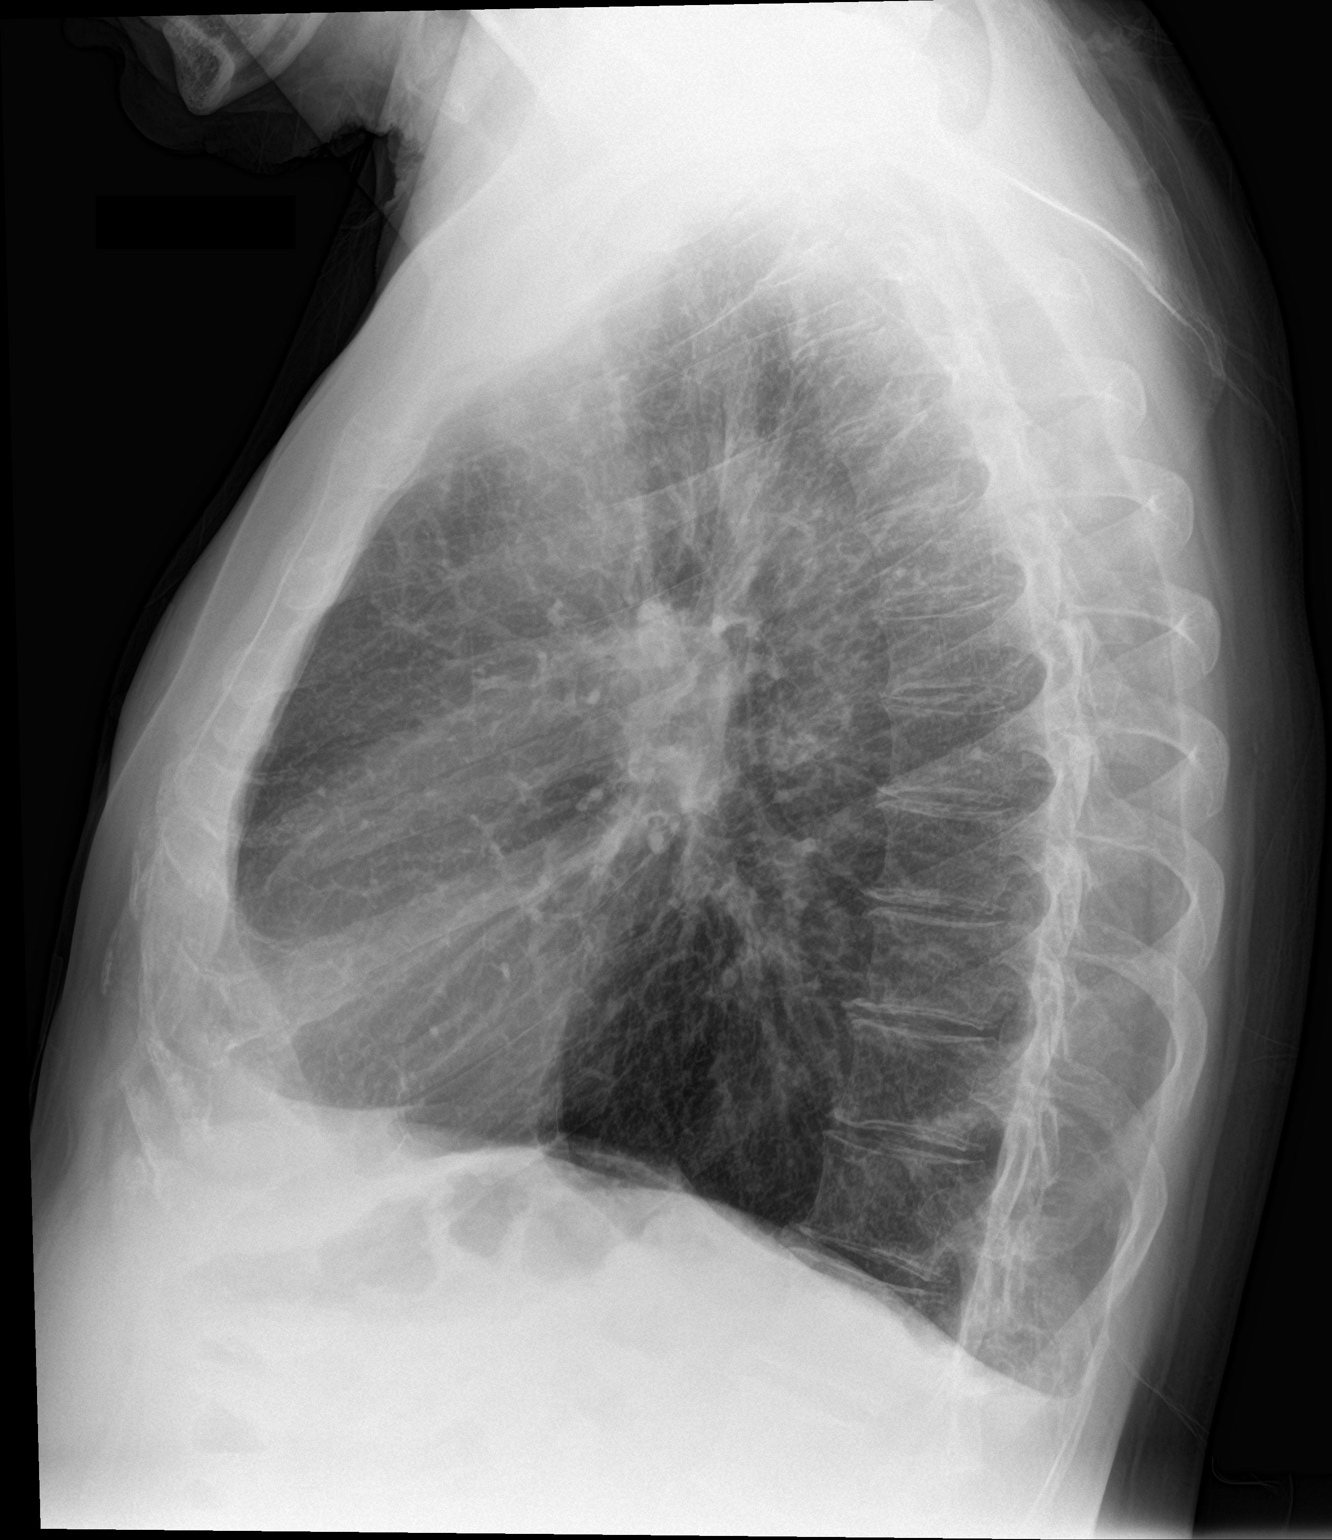

[2 of 2 positions shown; findings below may reference images not displayed]

FINDINGS: The cardiomediastinal silhouette is normal in size. Coarsened
interstitial markings, more prominent when compared to prior study.
Bilateral nipple shadows again noted. No focal consolidation,
pleural effusion, or pneumothorax. No acute osseous abnormality.
IMPRESSION: 1. Coarsened, increased interstitial markings, likely
smoking-related. No active cardiopulmonary disease.

## 2017-03-03 MED ORDER — AMOXICILLIN-POT CLAVULANATE 875-125 MG PO TABS
ORAL_TABLET | ORAL | Status: AC
  Filled 2017-03-03: qty 1

## 2017-03-03 MED ORDER — AMOXICILLIN-POT CLAVULANATE 875-125 MG PO TABS
1.0000 | ORAL_TABLET | Freq: Once | ORAL | Status: AC
  Administered 2017-03-03: 1 via ORAL

## 2017-03-03 MED ORDER — AMOXICILLIN-POT CLAVULANATE 875-125 MG PO TABS: 1.0000 | ORAL_TABLET | Freq: Two times a day (BID) | ORAL | 0 refills | Status: DC

## 2017-03-03 MED ORDER — PREDNISONE 10 MG PO TABS: ORAL_TABLET | ORAL | 0 refills | Status: DC

## 2017-03-03 NOTE — ED Provider Notes (Signed)
[facility] Emergency Department Provider Note  ____________________________________________  Time seen: Approximately 8:21 PM  I have reviewed the triage vital signs and the nursing notes.   HISTORY  Chief Complaint Weakness    HPI Marcus [name] is a 56 y.o. male who complains of chest discomfort shortness of breath productive cough for the past week as well as generalized weakness. Chest discomfort is not exertional, nonradiating, no vomiting or diaphoresis. It is related to his breathing difficulty which is gradually progressed. He has a history of COPD. He also has a history of rheumatoid arthritis and takes prednisone chronically. 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 Denies fevers chills dizziness or syncope. Eating and drinking normally. He's concerned because over the past few days she's had multiple falls. He denies any weakness or loss of balance or coordination, but just notes that he'll be walking along normally and then his right leg will buckle causing him to fall. He does have chronic arthritis in the leg. He does not use any assistive devices with ambulation but has a cane at home.    Past Medical History:  Diagnosis Date  . COPD (chronic obstructive pulmonary disease) ([place])   . Felty syndrome ([place])   . Pancytopenia ([place])   . Seropositive rheumatoid arthritis ([place])   . Tobacco use disorder      Patient Active Problem List   Diagnosis Date Noted  . Anemia 11/14/2015  . Seropositive rheumatoid arthritis ([place])   . Chronic obstructive pulmonary disease ([place])   . Protein-calorie malnutrition, severe 07/24/2015  . Pancytopenia ([place]) 07/23/2015  . 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 Leukopenia 10/21/2012  . 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 Abnormal EKG 10/21/2012  . Joint pain 10/21/2012  . Splenomegaly 10/21/2012     History reviewed. No pertinent surgical history.   Prior to Admission medications   Medication Sig Start Date End  Date Taking? Authorizing Provider  amoxicillin-clavulanate (AUGMENTIN) 875-125 MG tablet Take 1 tablet by mouth 2 (two) times daily. 03/03/17   Carrie Mew, MD  predniSONE (DELTASONE) 10 MG tablet 20mg  daily for 5 days, then decrease to 10mg  daily. 03/03/17   Carrie Mew, MD  sucralfate (CARAFATE) 1 g tablet Take 1 g by mouth 3 (three) times daily.     [provider]     Allergies Levaquin [levofloxacin in d5w] and Methotrexate   Family History  Problem Relation Age of Onset  . CAD Brother   . COPD Brother     Social History Social History   Tobacco Use  . Smoking status: Current Every Day Smoker    Packs/day: 1.00    Types: Cigarettes  . Smokeless tobacco: Never Used  Substance Use Topics  . Alcohol use: No    Alcohol/week: 0.0 oz  . Drug use: Yes    Types: Marijuana    Review of Systems  Constitutional:   No fever or chills.  ENT:   No sore throat. No rhinorrhea. Cardiovascular:   No chest pain or syncope. Respiratory:   Positive shortness of breath and productive cough Gastrointestinal:   Negative for abdominal pain, vomiting and diarrhea.  Musculoskeletal:   Negative for focal pain or swelling All other systems reviewed and are negative except as documented above in ROS and HPI.  ____________________________________________   PHYSICAL EXAM:  VITAL SIGNS: ED Triage Vitals  Enc Vitals Group     BP 03/03/17 1658 112/68     Pulse Rate 03/03/17 1658 85     Resp 03/03/17  1658 (!) 26     Temp 03/03/17 1658 98.7 F (37.1 C)     Temp Source 03/03/17 1658 Oral     SpO2 03/03/17 1658 94 %     Weight 03/03/17 1658 145 lb (65.8 kg)     Height 03/03/17 1658 5\' 10"  (1.778 m)     Head Circumference --      Peak Flow --      Pain Score 03/03/17 1657 10     Pain Loc --      Pain Edu? --      Excl. in [place]? --     Vital signs reviewed, nursing assessments reviewed.   Constitutional:   Alert and oriented. Well appearing and in no  distress. Eyes:   No scleral icterus.  EOMI. No nystagmus. No conjunctival pallor. PERRL. ENT   Head:   Normocephalic and atraumatic.   Nose:   No congestion/rhinnorhea.    Mouth/Throat:   MMM, no pharyngeal erythema. No peritonsillar mass.    Neck:   No meningismus. Full ROM. Hematological/Lymphatic/Immunilogical:   No cervical lymphadenopathy. Cardiovascular:   RRR. Symmetric bilateral radial and DP pulses.  No murmurs.  Respiratory:   Normal respiratory effort without tachypnea/retractions. Breath sounds are clear and equal bilaterally. Diffuse expiratory wheezing, accentuated by FEV1 maneuver. Normal expiratory phase. Good air entry in all lung fields.. Gastrointestinal:   Soft and nontender. Non distended. There is no CVA tenderness.  No rebound, rigidity, or guarding. Genitourinary:   deferred Musculoskeletal:   Normal range of motion in all extremities. No joint effusions.  No lower extremity tenderness.  No edema. Neurologic:   Normal speech and language.  Normal coordination balance and gait. Motor grossly intact. No gross focal neurologic deficits are appreciated.  Skin:    Skin is warm, dry and intact. No rash noted.  No petechiae, purpura, or bullae.  ____________________________________________    LABS (pertinent positives/negatives) (all labs ordered are listed, but only abnormal results are displayed) Labs Reviewed  BASIC METABOLIC PANEL - Abnormal; Notable for the following components:      Result Value   Sodium 134 (*)    Chloride 99 (*)    Glucose, Bld 158 (*)    BUN 23 (*)    Calcium 8.7 (*)    All other components within normal limits  CBC - Abnormal; Notable for the following components:   WBC 2.6 (*)    RBC 5.93 (*)    MCV 78.0 (*)    MCH 25.2 (*)    RDW 16.1 (*)    All other components within normal limits  URINALYSIS, COMPLETE (UACMP) WITH MICROSCOPIC - Abnormal; Notable for the following components:   Color, Urine AMBER (*)    APPearance  CLOUDY (*)    Protein, ur 30 (*)    All other components within normal limits  TROPONIN I   ____________________________________________   EKG  Interpreted by me Normal sinus rhythm rate of 84, left axis, right bundle branch block, no acute ischemic changes.  ____________________________________________    RADIOLOGY  Dg Chest 2 View  Result Date: 03/03/2017 CLINICAL DATA:  Cough and shortness of breath. EXAM: CHEST  2 VIEW COMPARISON:  Chest x-ray dated November 14, 2015. FINDINGS: The cardiomediastinal silhouette is normal in size. Coarsened interstitial markings, more prominent when compared to prior study. Bilateral nipple shadows again noted. No focal consolidation, pleural effusion, or pneumothorax. No acute osseous abnormality. IMPRESSION: 1. Coarsened, increased interstitial markings, likely smoking-related. No active cardiopulmonary disease. Electronically Signed  By: Titus Dubin M.D.   On: 03/03/2017 18:07    ____________________________________________   PROCEDURES Procedures  ____________________________________________     CLINICAL IMPRESSION / ASSESSMENT AND PLAN / ED COURSE  Pertinent labs & imaging results that were available during my care of the patient were reviewed by me and considered in my medical decision making (see chart for details).   Patient well-appearing no acute distress, presents with generalized weakness, cough congestion and shortness of breath. Presentation is consistent with a exacerbation of his COPD, mild. He is on prednisone chronically, but recently ran out within the past few days. I will refill this with a short-term of increased dosage both to maintain control of his rheumatoid arthritis and to help with his COPD exacerbation. He has bronchodilators at home. I will prescribed Augmentin due to his chronic steroid use and history of splenectomy. Vital signs are otherwise unremarkable, low suspicion for ACS PE dissection. He is not  septic, not in respiratory distress, suitable for outpatient follow-up. He wants to transition his care from [facility] to doctors that are more local. I provided referral information for these physicians.   Patient's falls appear to be mechanical related to his arthritis. Recommended he use the cane while he is under the stress of this acute exacerbation of his COPD and likely underlying pneumonia. Once this is resolved, I expect his energy will return to normal and he'll no longer need the cane.     ____________________________________________   FINAL CLINICAL IMPRESSION(S) / ED DIAGNOSES    Final diagnoses:  Generalized weakness  Community acquired pneumonia, unspecified laterality      This SmartLink is deprecated. Use AVSMEDLIST instead to display the medication list for a patient.   Portions of this note were generated with dragon dictation software. Dictation errors may occur despite best attempts at proofreading.    Carrie Mew, MD 03/03/17 2027

## 2017-03-03 NOTE — ED Triage Notes (Signed)
Pt c/o generalized weakness and multiple falls since thanksgiving time.  Has pain "all over".  No fevers. Has SHOB at baseline but feels it is worse than normal. Was coughing earlier today but it got better.  Tachypnea in triage but unlabored. Minimal to no wheezing at this time.

## 2017-03-03 NOTE — ED Notes (Signed)
Pt states that his chest hurts and he thinks he has pneumonia. Also states he is breathing "hard".

## 2017-05-29 ENCOUNTER — Telehealth: Payer: Self-pay | Admitting: *Deleted

## 2017-05-29 ENCOUNTER — Other Ambulatory Visit: Payer: Self-pay | Admitting: Hematology and Oncology

## 2017-05-29 ENCOUNTER — Inpatient Hospital Stay: Payer: Medicaid Other

## 2017-05-29 ENCOUNTER — Other Ambulatory Visit: Payer: Self-pay

## 2017-05-29 ENCOUNTER — Inpatient Hospital Stay: Payer: Medicaid Other | Attending: Hematology and Oncology | Admitting: Hematology and Oncology

## 2017-05-29 VITALS — BP 144/89 | HR 81 | Temp 97.1°F | Resp 18 | Wt 163.6 lb

## 2017-05-29 DIAGNOSIS — M069 Rheumatoid arthritis, unspecified: Secondary | ICD-10-CM | POA: Diagnosis not present

## 2017-05-29 DIAGNOSIS — Z8701 Personal history of pneumonia (recurrent): Secondary | ICD-10-CM

## 2017-05-29 DIAGNOSIS — Z9081 Acquired absence of spleen: Secondary | ICD-10-CM

## 2017-05-29 DIAGNOSIS — J449 Chronic obstructive pulmonary disease, unspecified: Secondary | ICD-10-CM | POA: Diagnosis not present

## 2017-05-29 DIAGNOSIS — M0500 Felty's syndrome, unspecified site: Secondary | ICD-10-CM

## 2017-05-29 DIAGNOSIS — M059 Rheumatoid arthritis with rheumatoid factor, unspecified: Secondary | ICD-10-CM

## 2017-05-29 DIAGNOSIS — D72819 Decreased white blood cell count, unspecified: Secondary | ICD-10-CM

## 2017-05-29 DIAGNOSIS — R634 Abnormal weight loss: Secondary | ICD-10-CM | POA: Insufficient documentation

## 2017-05-29 DIAGNOSIS — Z881 Allergy status to other antibiotic agents status: Secondary | ICD-10-CM | POA: Insufficient documentation

## 2017-05-29 DIAGNOSIS — F17210 Nicotine dependence, cigarettes, uncomplicated: Secondary | ICD-10-CM | POA: Insufficient documentation

## 2017-05-29 DIAGNOSIS — Z7952 Long term (current) use of systemic steroids: Secondary | ICD-10-CM

## 2017-05-29 DIAGNOSIS — R109 Unspecified abdominal pain: Secondary | ICD-10-CM | POA: Insufficient documentation

## 2017-05-29 DIAGNOSIS — K0889 Other specified disorders of teeth and supporting structures: Secondary | ICD-10-CM

## 2017-05-29 DIAGNOSIS — Z79899 Other long term (current) drug therapy: Secondary | ICD-10-CM | POA: Diagnosis not present

## 2017-05-29 DIAGNOSIS — R5383 Other fatigue: Secondary | ICD-10-CM | POA: Diagnosis not present

## 2017-05-29 DIAGNOSIS — R0609 Other forms of dyspnea: Secondary | ICD-10-CM | POA: Diagnosis not present

## 2017-05-29 LAB — CBC WITH DIFFERENTIAL/PLATELET
Basophils Absolute: 0 10*3/uL (ref 0–0.1)
Basophils Relative: 1 %
Eosinophils Absolute: 0 10*3/uL (ref 0–0.7)
Eosinophils Relative: 2 %
HCT: 55.1 % — ABNORMAL HIGH (ref 40.0–52.0)
Hemoglobin: 17.9 g/dL (ref 13.0–18.0)
Lymphocytes Relative: 41 %
Lymphs Abs: 0.6 10*3/uL — ABNORMAL LOW (ref 1.0–3.6)
MCH: 25.4 pg — ABNORMAL LOW (ref 26.0–34.0)
MCHC: 32.6 g/dL (ref 32.0–36.0)
MCV: 78 fL — ABNORMAL LOW (ref 80.0–100.0)
Monocytes Absolute: 0.3 10*3/uL (ref 0.2–1.0)
Monocytes Relative: 23 %
Neutro Abs: 0.5 10*3/uL — ABNORMAL LOW (ref 1.4–6.5)
Neutrophils Relative %: 33 %
Platelets: 296 10*3/uL (ref 150–440)
RBC: 7.06 MIL/uL — ABNORMAL HIGH (ref 4.40–5.90)
RDW: 17 % — ABNORMAL HIGH (ref 11.5–14.5)
WBC: 1.4 10*3/uL — CL (ref 3.8–10.6)

## 2017-05-29 NOTE — Patient Instructions (Signed)

## 2017-05-29 NOTE — Progress Notes (Signed)
Here for follow up. Multiple c/o-see follow up area. " stated my stomachs been hurting " showed staff" hernias he stated he had on abdomen " [facility] aware he stated and he stated "they said its a hernia or air pocket " -had this x 1.5 y. [facility] and [facility] he stated

## 2017-05-29 NOTE — Telephone Encounter (Signed)
Per 05/29/17 los. To schedule  Labs today- scheduled as Requested per 05/29/17 los. [name]  "Patient needs to see Elease Etienne re: dentist [place] class RTC in 2 weeks for MD assessment"   I made patient aware of what all needed to be scheduled for him, he stated That he was not going to Chemo Edu. Class, And that he was Not going  To take any Chemo at all. He also said he told Dr. Mike Gip that he was not doing  It. He was not getting any of that stuff  Pumped in him and stated that maybe she misunderstood him. I then asked him if he planned on coming  To see her in 2 weeks he said NO. Due to not have transportation I made him aware that we provided Lucianne Lei transportation for local patients,  but he Lives in [place]. Again he said No. So at that point I just sent him to  The lab as requested.

## 2017-05-29 NOTE — Progress Notes (Signed)
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  HPI:  The patient was last seen in the medical oncology clinic on 08/06/2015.  He had been discharged from [facility] on 07/28/2015 with mouth sores and decreased oral intake.  Symptomatically, he noted waxing and waning mouth ulcers.  He had no interval fevers or infections.  He had lost 24 pounds in the past 4 months. Weight was 163 pounds.  He was lost to follow-up.  He presented to [facility] ER on 11/21/2015 with abdominal pain.  CT imaging showed severe splenomegaly.  CBC revealed a hemoglobin of 7.8, platelets 69,000, WBC 300 with an ANC of 100. Hematology, surgery, and rheumatology evaluated and recommended splenectomy which was performed on 11/26/2015. Pathology was negative for malignancy. His platelet count and hemoglobin improved athough his WBC remained low.  He was maintained on prednisone 10 mg a day monotherapy.   He saw Dr Santiago Bumpers [phone]) on 06/30/2016.  Discussions were held about initiation of Rituxan after dental issues had resolved.  He was to have his teeth pulled.  He was subsequently seen by Dr. Marlowe Sax for initial consultation on 05/20/2017.  CBC revealed a hematocrit of 50.9, hemoglobin 16, MCV 81.2, platelets 291,000, WBC 1500 with an ANC of 290.  He remains on prednisone 10 mg/day.  Discussions were held regarding Rituxan.  Symptomatically, he states that his rheumatoid arthritis is worse.  "I can't stretch my fingers out".  Arthritis affects "hands, elbows, legs, feet, and shoulders".  He states that he has "8 teeth left" (5 hurt).  He does not have a "ride or a dentist".  He denies issues with infections except for pneumonia in 02/2017 (seen in the ER).   Past Medical History:  Diagnosis Date  . COPD (chronic obstructive pulmonary disease) ([place])   . 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  Pancytopenia ([place])   . Seropositive rheumatoid arthritis ([place])   . Tobacco use disorder     No past surgical history on file.  Family History  Problem Relation Age of Onset  . CAD Brother   . COPD Brother     Social History:  reports that he has been smoking cigarettes.  He has been smoking about 1.00 pack per day. he has never used smokeless tobacco. He reports that he uses drugs. Drug: Marijuana. He reports that he does not drink alcohol.  He has smoked 1 pack/day x 45 years.  He lives with his mother.  He has no job or insurance.  He is accompanied by his brother, Jeneen Rinks,  today.  Allergies:  Allergies  Allergen Reactions  . Levaquin [Levofloxacin In D5w] Other (See Comments)    Chest pain  . Methotrexate Other (See Comments)    Chest pain    Current Medications: Current Outpatient Medications  Medication Sig Dispense Refill  . predniSONE (DELTASONE) 10 MG tablet 20mg  daily for 5 days, then decrease to 10mg  daily. 35 tablet 0  . albuterol (PROVENTIL HFA) 108 (90 Base) MCG/ACT inhaler Inhale into the lungs.    . sucralfate (CARAFATE) 1 g tablet Take 1 g by mouth 3 (three) times daily.     . Vitamin D, Ergocalciferol, (DRISDOL) 50000 units CAPS capsule Take one capsule once a week for 12 weeks     No current facility-administered medications for this visit.     Review of Systems:  GENERAL: Feels "  not good".  Fatigue. No fevers or sweats. Weight stable since 08/2015. PERFORMANCE STATUS (ECOG): 2 HEENT: Mouth sores, wax and wane. Hoarse s/p ENT evaluation. No visual changes. Lungs: Shortness of breath with exertion.  No cough.  Phlegm (no change).  No hemoptysis. Cardiac: No chest pain, palpitations, orthopnea, or PND. GI: Stomach ache/cramping.  Brushing teeth causes emesis.  No vomiting, diarrhea, constipation, melena or hematochezia. GU: No urgency, frequency, dysuria, or hematuria. Musculoskeletal: Arthritis hands, knees, elbows. No muscle  tenderness. Extremities: Rheumatoid arthritis, worse. No lower extremity swelling. Skin: No rashes or skin changes. Neuro: No headache, numbness or weakness, balance or coordination issues. Endocrine: No diabetes, thyroid issues, hot flashes or night sweats. Psych: No mood changes, depression or anxiety. Pain: Aching in joints. Review of systems: All other systems reviewed and found to be negative  Physical Exam: Blood pressure (!) 144/89, pulse 81, temperature (!) 97.1 F (36.2 C), temperature source Tympanic, resp. rate 18, weight 163 lb 9.6 oz (74.2 kg). GENERAL: Chronically ill appearing gentleman sitting comfortably on the medical unit in no acute distress. MENTAL STATUS: Alert and oriented to person, place and time. HEAD: Short gray hair. [name]. Normocephalic, atraumatic, face symmetric, no Cushingoid features. EYES: Blue-gray eyes. Pupils equal round and reactive to light and accomodation. No conjunctivitis or scleral icterus. ENT: Oropharynx clear without lesion. Poor dentition. Tongue normal. Mucous membranes moist.  RESPIRATORY: Clear to auscultation without rales, wheezes or rhonchi. CARDIOVASCULAR: Regular rate and rhythm without murmur, rub or gallop. ABDOMEN: Incisional hernia.  Soft, mildly tender, with no guarding or rebound tenderness.  Active bowel sounds, and no hepatomegaly. s/p splenectomy. No masses. SKIN: No rashes, ulcers or lesions. EXTREMITIES: Right hand flexed with ulnar deviation.  No edema, no skin discoloration or tenderness. No palpable cords. LYMPH NODES: No palpable cervical, supraclavicular, axillary or inguinal adenopathy  NEUROLOGICAL: Unremarkable. PSYCH: Appropriate.   Appointment on 05/29/2017  Component Date Value Ref Range Status  . HCV Ab 05/29/2017 <0.1  0.0 - 0.9 s/co ratio Final   Comment: (NOTE)                                  Negative:     < 0.8                             Indeterminate: 0.8 - 0.9                                   Positive:     > 0.9 The CDC recommends that a positive HCV antibody result be followed up with a HCV Nucleic Acid Amplification test ([id number]). Performed At: [facility] [place], [place] [id number] Rush Farmer MD [id number] Performed at [facility], [address] [place], [place] [address]   . Hepatitis B Surface Ag 05/29/2017 Negative  Negative Final   Comment: (NOTE) Performed At: [facility] [place], [place] [id number] Rush Farmer MD [id number] Performed at [facility], [address], [place], [place] [address]   . Hep B Core Total Ab 05/29/2017 Negative  Negative Final   Comment: (NOTE) Performed At: [facility] [place], [place] [id number] Rush Farmer MD [id number] Performed at [facility], [address] [place], [place] [address]   . HIV  Screen 4th Generation wRfx 05/29/2017 Non Reactive  Non Reactive Final   Comment: (NOTE) Performed At: [facility] [place], [place] [id number] Rush Farmer MD [id number] Performed at [facility], [address] [place], [place] [address]   . WBC 05/29/2017 1.4* 3.8 - 10.6 K/uL Final   Comment: REPEATED TO VERIFY CANCER CENTER CRITICAL VALUE PROTOCOL   . RBC 05/29/2017 7.06* 4.40 - 5.90 MIL/uL Final  . Hemoglobin 05/29/2017 17.9  13.0 - 18.0 g/dL Final  . HCT 05/29/2017 55.1* 40.0 - 52.0 % Final  . MCV 05/29/2017 78.0* 80.0 - 100.0 fL Final  . MCH 05/29/2017 25.4* 26.0 - 34.0 pg Final  . MCHC 05/29/2017 32.6  32.0 - 36.0 g/dL Final  . RDW 05/29/2017 17.0* 11.5 - 14.5 % Final  . Platelets 05/29/2017 296  150 - 440 K/uL Final  . Neutrophils Relative % 05/29/2017 33  % Final  . Neutro Abs 05/29/2017 0.5* 1.4 - 6.5 K/uL Final  . Lymphocytes Relative 05/29/2017 41  % Final  . Lymphs Abs 05/29/2017 0.6* 1.0 - 3.6 K/uL Final  . Monocytes Relative  05/29/2017 23  % Final  . Monocytes Absolute 05/29/2017 0.3  0.2 - 1.0 K/uL Final  . Eosinophils Relative 05/29/2017 2  % Final  . Eosinophils Absolute 05/29/2017 0.0  0 - 0.7 K/uL Final  . Basophils Relative 05/29/2017 1  % Final  . Basophils Absolute 05/29/2017 0.0  0 - 0.1 K/uL Final   Performed at [facility], [address] [place], [place] [address]    Assessment:  CRANFORD BLESSINGER is a 57 y.o. male with 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rheumatoid arthritis, splenomegaly, neutropenia) s/p splenectomy (2017).  Original work-up at [facility] revealed signicantly elevated CCP and RF. ANA was > 1:640 but had negative dsDNA and ENA.  He was unresponsive to methotrexate.  He was admitted to [facility] from 07/23/2015 - 07/28/2015 with bronchitis, mouth sores, and decreased oral intake. CBC revealed a hematocrit of 21.4, hemoglobin 6.9, MCV 66.3, platelets 85,000, WBC 400. CXR revealed slight bronchitic changes. He was transfused with PRBCs. He was treated with azithromycin and ceftriaxone and discharged on doxycycline.  Work-up during his admission included the following normal studies: HIV testing , hepatitis B surface antigen, hepatitis B core antibody , hepatitis C antibody, ANA, ferritin (267), iron saturation (37%), B12 (440), folate (8.1). Reticulocyte count was 3.6%.  LDH was 196 (98-192).  Urine revealed no protein.  LFTs were normal.  Albumen was 2.0 (low).  Abdomen and pelvic CT on 07/26/2015 revealed an area of peribronchovascular opacity in the central right middle lobe (new) suspicious for bronchopneumonia.  There was marked splenomegaly (30 cm x 12 cm x 23 cm, previously 22 cm x 8.7 cm x 16 cm).  There was a small infrarenal abdominal aortic aneurysm (2.6 cm).  Hand films revealed moderate periarticular osteopenia but no significant degenerative changes or erosive findings.  Bone marrow aspirate and biopsy on 07/27/2015 revealed a hypercellular marrow for age (70-80%) with relative myeloid  hyperplasia, mild multilineage dyspoiesis and no increased blasts.  There was mildly increased T-lymphocytes, nonspecific.  There was mild polytypic plasmocytosis.  There was mild to focally moderate increase in reticulin.  Storage iron was present.Flow cytometry revealed no significant myeloid/monocytic abnormalities or increase in blasts.  There was no monoclonality.  There was a relatively increased T-cell receptor gamma/delta+ T cells (9%), nonspecific.  Cytogenetics were normal (46, XY).  Clonal T-cell receptor gamma and beta chain gene rearrangements were detected (?  T cell neoplasia or autoimmune disease).  He underwent splenectomy at [facility] on 11/26/2015.  Thrombocytopenia resolved.  Leukopenia persists.  He had pneumonia treated in the outpatient department in 02/2017.  Symptomatically, he is fatigued.  He has symptomatic rheumatoid arthritis.  Dentition is poor.   Plan: 1.  Review interval events. 2.  Labs today:  CBC with diff, hepatitis B core antibody total, hepatitis B surface antigen, hepatitis C antibody, HIV antibody. 3.  Contact Dr. Santiago Bumpers at [facility] and Dr. Annalee Genta re:  Stanford Breed. 4.  Review fever and neutropenia precautions. 5.  Chemotherapy teaching class. 6.  Follow-up with Elease Etienne, social worker re: dental assistance and transportation. 7.  RTC in 2 weeks for MD assessment.   Lequita Asal, MD  05/29/2017, 5:35 PM

## 2017-05-30 LAB — HEPATITIS B SURFACE ANTIGEN: Hepatitis B Surface Ag: NEGATIVE

## 2017-05-30 LAB — HIV ANTIBODY (ROUTINE TESTING W REFLEX): HIV Screen 4th Generation wRfx: NONREACTIVE

## 2017-05-30 LAB — HEPATITIS B CORE ANTIBODY, TOTAL: Hep B Core Total Ab: NEGATIVE

## 2017-05-30 LAB — HEPATITIS C ANTIBODY: HCV Ab: <0.1 s/co ratio (ref 0.0–0.9)

## 2017-06-08 ENCOUNTER — Encounter: Payer: Self-pay | Admitting: Hematology and Oncology

## 2017-06-09 ENCOUNTER — Telehealth: Payer: Self-pay | Admitting: Urgent Care

## 2017-06-09 NOTE — Telephone Encounter (Signed)
Call placed to [facility] clinic to discuss patient plan of care.  Spoke with Dr. Francesca Oman nurse Vicente Males.  Nurse made aware that patient not consenting to treatment at this time.  Patient has concerns with receiving medication derived from "a mouse".  Patient also has transportation issues preventing him from attending treatments here on a regular basis. Secondary to his Felty's syndrome diagnosis, patient is chronically neutropenic.  Patient has dental issues that serve as a constant source of infection for this patient.  During his last visit to the clinic we discussed need for extraction.  Vicente Males to follow-up with Dr. Meda Coffee return call to the clinic to discuss plans for care going forward.

## 2017-06-10 ENCOUNTER — Telehealth: Payer: Self-pay | Admitting: Urgent Care

## 2017-06-10 NOTE — Telephone Encounter (Signed)
Call returned to Vicente Males, RN with Dr. Francesca Oman office. Vicente Males advising that Dr. Meda Coffee had personally spoken with Marcus [name] regarding his treatment. Patient once again consenting to treatment, however he noted that he wanted to get his dental issues taken care of prior to continuing with recommended treatment. Will plan on connecting patient with [facility] dental clinic to address dental needs. Dr. Mike Gip made aware of patient's decision to hold on treatment at this time.

## 2018-07-23 ENCOUNTER — Other Ambulatory Visit (HOSPITAL_COMMUNITY): Payer: Self-pay | Admitting: Gastroenterology

## 2018-07-23 ENCOUNTER — Other Ambulatory Visit: Payer: Self-pay | Admitting: Gastroenterology

## 2018-07-23 DIAGNOSIS — R1314 Dysphagia, pharyngoesophageal phase: Secondary | ICD-10-CM

## 2018-08-17 ENCOUNTER — Telehealth (HOSPITAL_COMMUNITY): Payer: Self-pay

## 2018-08-25 ENCOUNTER — Ambulatory Visit: Payer: Medicaid Other

## 2019-08-18 ENCOUNTER — Emergency Department (HOSPITAL_COMMUNITY): Payer: Medicaid Other

## 2019-08-18 ENCOUNTER — Other Ambulatory Visit: Payer: Self-pay

## 2019-08-18 ENCOUNTER — Encounter (HOSPITAL_COMMUNITY): Payer: Self-pay | Admitting: Pediatrics

## 2019-08-18 ENCOUNTER — Inpatient Hospital Stay (HOSPITAL_COMMUNITY)
Admission: EM | Admit: 2019-08-18 | Discharge: 2019-08-20 | DRG: 190 | Disposition: A | Discharge status: Home / Self Care | Payer: Medicaid Other | Attending: Internal Medicine | Admitting: Internal Medicine

## 2019-08-18 DIAGNOSIS — R491 Aphonia: Secondary | ICD-10-CM | POA: Diagnosis present

## 2019-08-18 DIAGNOSIS — Z23 Encounter for immunization: Secondary | ICD-10-CM

## 2019-08-18 DIAGNOSIS — J441 Chronic obstructive pulmonary disease with (acute) exacerbation: Secondary | ICD-10-CM | POA: Diagnosis not present

## 2019-08-18 DIAGNOSIS — Z825 Family history of asthma and other chronic lower respiratory diseases: Secondary | ICD-10-CM

## 2019-08-18 DIAGNOSIS — I4510 Unspecified right bundle-branch block: Secondary | ICD-10-CM | POA: Diagnosis present

## 2019-08-18 DIAGNOSIS — Z20822 Contact with and (suspected) exposure to covid-19: Secondary | ICD-10-CM | POA: Diagnosis present

## 2019-08-18 DIAGNOSIS — D751 Secondary polycythemia: Secondary | ICD-10-CM | POA: Diagnosis present

## 2019-08-18 DIAGNOSIS — Z9081 Acquired absence of spleen: Secondary | ICD-10-CM

## 2019-08-18 DIAGNOSIS — Z8249 Family history of ischemic heart disease and other diseases of the circulatory system: Secondary | ICD-10-CM

## 2019-08-18 DIAGNOSIS — Z7952 Long term (current) use of systemic steroids: Secondary | ICD-10-CM

## 2019-08-18 DIAGNOSIS — M0500 Felty's syndrome, unspecified site: Secondary | ICD-10-CM | POA: Diagnosis present

## 2019-08-18 DIAGNOSIS — Z888 Allergy status to other drugs, medicaments and biological substances status: Secondary | ICD-10-CM

## 2019-08-18 DIAGNOSIS — D72819 Decreased white blood cell count, unspecified: Secondary | ICD-10-CM

## 2019-08-18 DIAGNOSIS — F17210 Nicotine dependence, cigarettes, uncomplicated: Secondary | ICD-10-CM | POA: Diagnosis present

## 2019-08-18 DIAGNOSIS — R0902 Hypoxemia: Secondary | ICD-10-CM | POA: Diagnosis not present

## 2019-08-18 DIAGNOSIS — J9601 Acute respiratory failure with hypoxia: Secondary | ICD-10-CM | POA: Diagnosis present

## 2019-08-18 DIAGNOSIS — M059 Rheumatoid arthritis with rheumatoid factor, unspecified: Secondary | ICD-10-CM | POA: Diagnosis present

## 2019-08-18 DIAGNOSIS — K439 Ventral hernia without obstruction or gangrene: Secondary | ICD-10-CM | POA: Diagnosis present

## 2019-08-18 DIAGNOSIS — I714 Abdominal aortic aneurysm, without rupture: Secondary | ICD-10-CM | POA: Diagnosis present

## 2019-08-18 DIAGNOSIS — Z881 Allergy status to other antibiotic agents status: Secondary | ICD-10-CM

## 2019-08-18 HISTORY — DX: Ventral hernia without obstruction or gangrene: K43.9

## 2019-08-18 LAB — COMPREHENSIVE METABOLIC PANEL
ALT: 17 U/L (ref 0–44)
AST: 24 U/L (ref 15–41)
Albumin: 2.8 g/dL — ABNORMAL LOW (ref 3.5–5.0)
Alkaline Phosphatase: 95 U/L (ref 38–126)
Anion gap: 11 (ref 5–15)
BUN: 13 mg/dL (ref 6–20)
CO2: 25 mmol/L (ref 22–32)
Calcium: 8.6 mg/dL — ABNORMAL LOW (ref 8.9–10.3)
Chloride: 102 mmol/L (ref 98–111)
Creatinine, Ser: 0.75 mg/dL (ref 0.61–1.24)
GFR calc Af Amer: >60 mL/min (ref >60)
GFR calc non Af Amer: >60 mL/min (ref >60)
Glucose, Bld: 108 mg/dL — ABNORMAL HIGH (ref 70–99)
Potassium: 3.9 mmol/L (ref 3.5–5.1)
Sodium: 138 mmol/L (ref 135–145)
Total Bilirubin: 0.8 mg/dL (ref 0.3–1.2)
Total Protein: 7.7 g/dL (ref 6.5–8.1)

## 2019-08-18 LAB — CBC WITH DIFFERENTIAL/PLATELET
Abs Immature Granulocytes: 0.01 10*3/uL (ref 0.00–0.07)
Basophils Absolute: 0 10*3/uL (ref 0.0–0.1)
Basophils Relative: 2 %
Eosinophils Absolute: 0.1 10*3/uL (ref 0.0–0.5)
Eosinophils Relative: 6 %
HCT: 58.1 % — ABNORMAL HIGH (ref 39.0–52.0)
Hemoglobin: 18 g/dL — ABNORMAL HIGH (ref 13.0–17.0)
Immature Granulocytes: 1 %
Lymphocytes Relative: 50 %
Lymphs Abs: 0.9 10*3/uL (ref 0.7–4.0)
MCH: 28 pg (ref 26.0–34.0)
MCHC: 31 g/dL (ref 30.0–36.0)
MCV: 90.4 fL (ref 80.0–100.0)
Monocytes Absolute: 0.5 10*3/uL (ref 0.1–1.0)
Monocytes Relative: 30 %
Neutro Abs: 0.2 10*3/uL — ABNORMAL LOW (ref 1.7–7.7)
Neutrophils Relative %: 11 %
Platelets: 176 10*3/uL (ref 150–400)
RBC: 6.43 MIL/uL — ABNORMAL HIGH (ref 4.22–5.81)
RDW: 17.2 % — ABNORMAL HIGH (ref 11.5–15.5)
WBC: 1.8 10*3/uL — ABNORMAL LOW (ref 4.0–10.5)
nRBC: 0 % (ref 0.0–0.2)

## 2019-08-18 LAB — SARS CORONAVIRUS 2 BY RT PCR (HOSPITAL ORDER, PERFORMED IN ~~LOC~~ HOSPITAL LAB): SARS Coronavirus 2: NEGATIVE

## 2019-08-18 IMAGING — CR DG CHEST 2V
2 series · 2 of 2 positions shown · non-contrast
Comparison: [DATE]

CLINICAL DATA: Shortness of breath

EXAM:
CHEST - 2 VIEW

[chest pa]
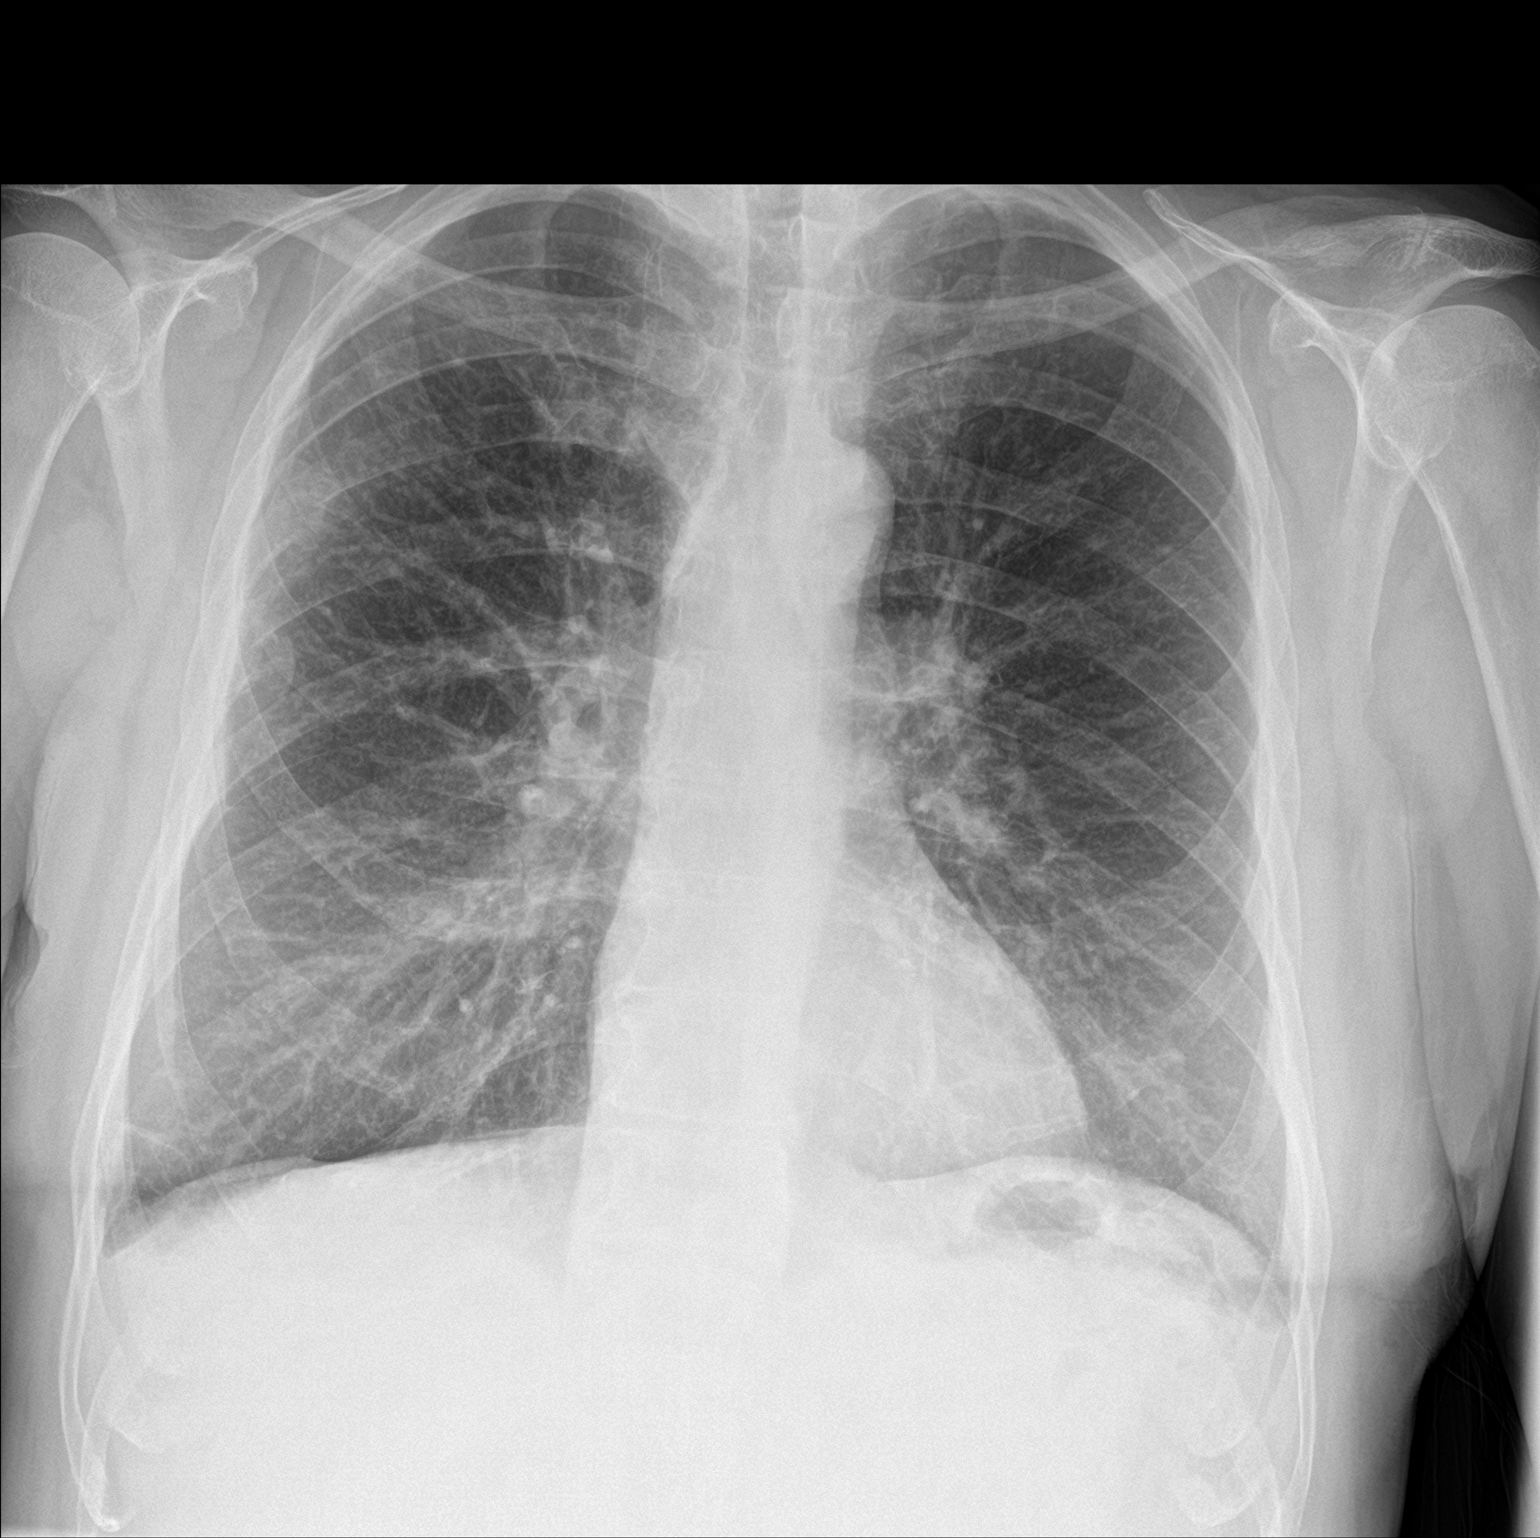

[chest lat]
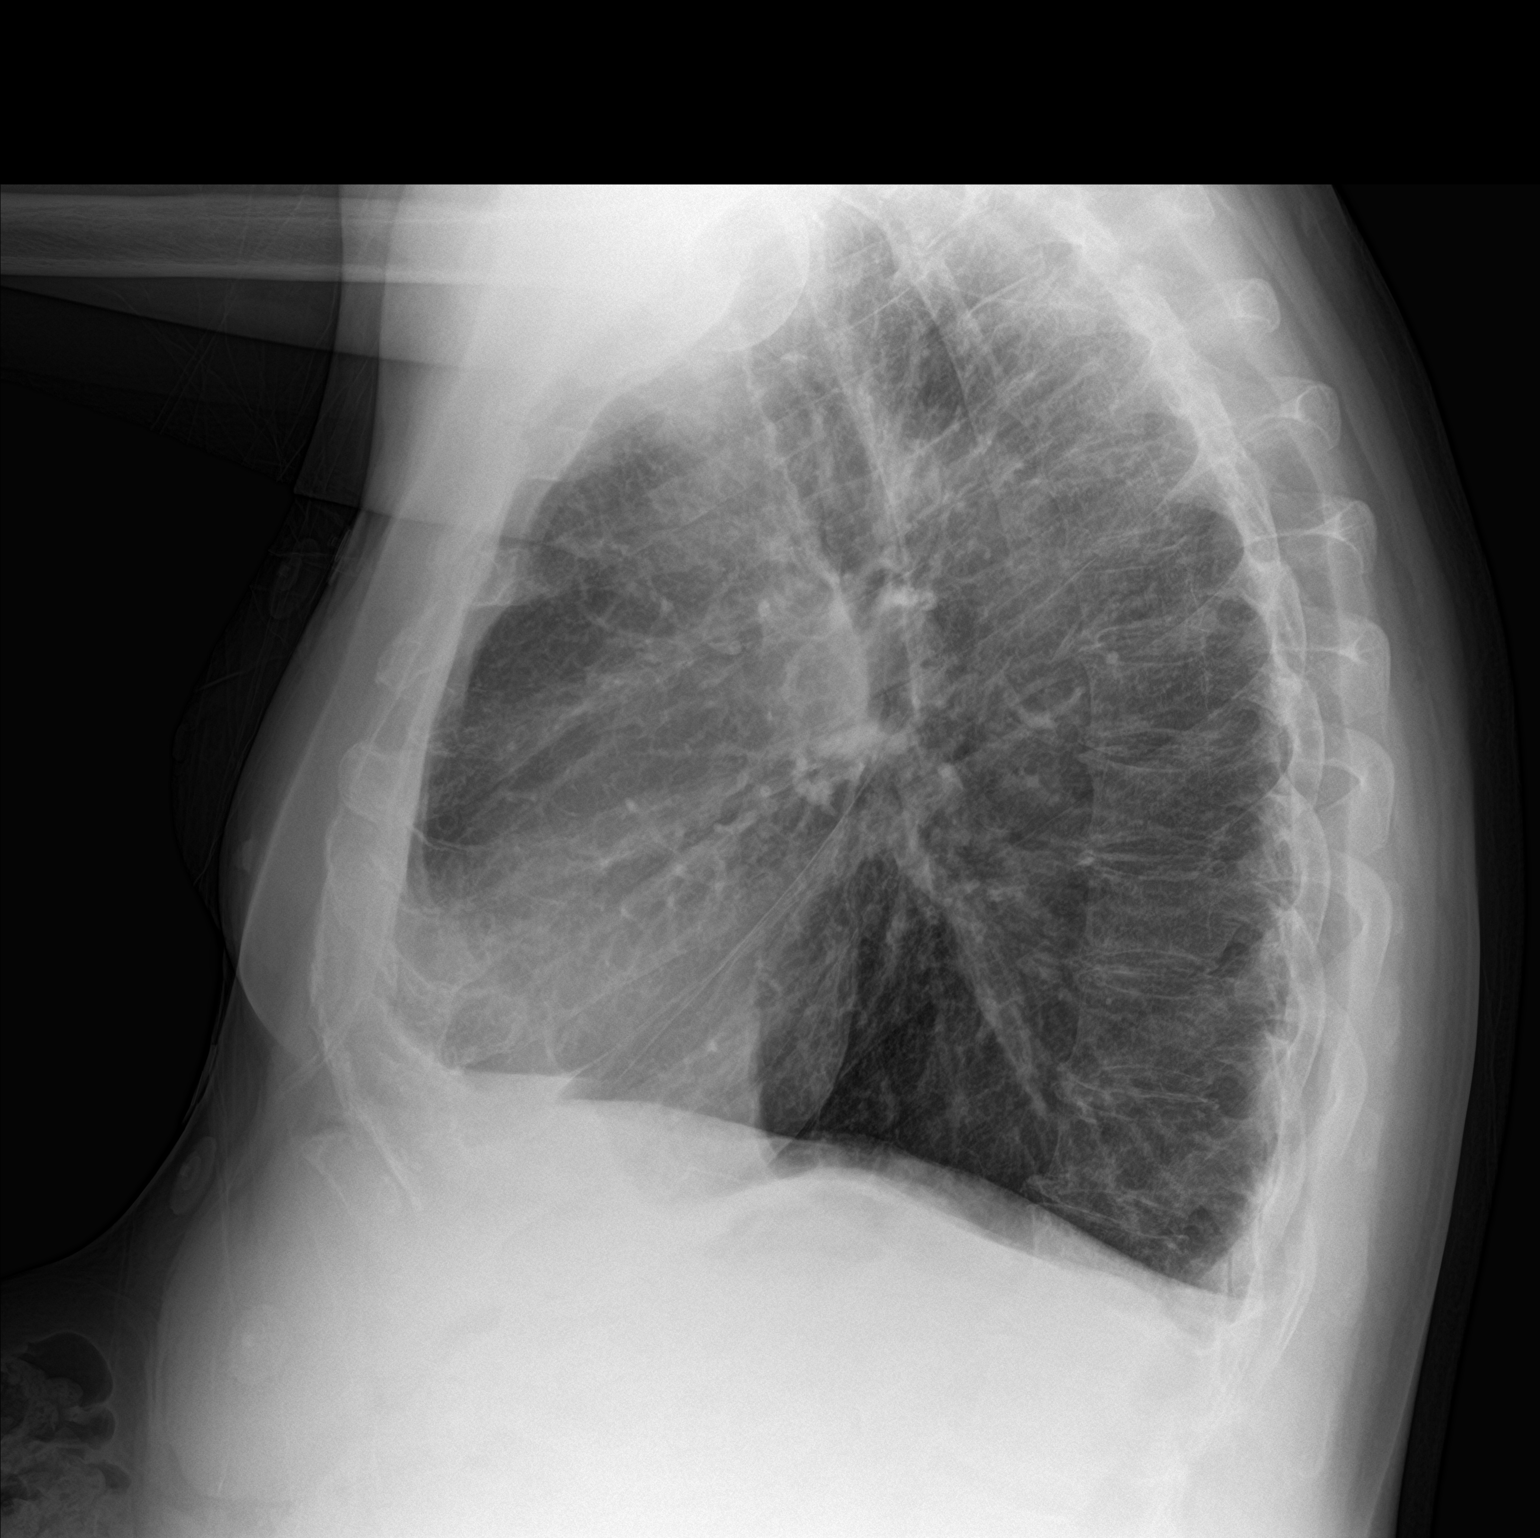

[2 of 2 positions shown; findings below may reference images not displayed]

FINDINGS: There is hyperinflation of the lungs compatible with COPD. Heart is
normal size. No confluent opacities or effusions. No acute bony
abnormality.
IMPRESSION: COPD.  No active disease.

## 2019-08-18 MED ORDER — IPRATROPIUM BROMIDE HFA 17 MCG/ACT IN AERS
2.0000 | INHALATION_SPRAY | Freq: Once | RESPIRATORY_TRACT | Status: AC
  Administered 2019-08-18: 2 via RESPIRATORY_TRACT
  Filled 2019-08-18: qty 12.9

## 2019-08-18 MED ORDER — METHYLPREDNISOLONE SODIUM SUCC 125 MG IJ SOLR
80.0000 mg | Freq: Once | INTRAMUSCULAR | Status: AC
  Administered 2019-08-18: 80 mg via INTRAVENOUS
  Filled 2019-08-18: qty 2

## 2019-08-18 MED ORDER — MAGNESIUM SULFATE 2 GM/50ML IV SOLN
2.0000 g | Freq: Once | INTRAVENOUS | Status: AC
  Administered 2019-08-19: 2 g via INTRAVENOUS
  Filled 2019-08-18: qty 50

## 2019-08-18 MED ORDER — SODIUM CHLORIDE 0.9 % IV SOLN
2.0000 g | Freq: Three times a day (TID) | INTRAVENOUS | Status: DC
  Administered 2019-08-19 (×2): 2 g via INTRAVENOUS
  Filled 2019-08-18 (×4): qty 2

## 2019-08-18 MED ORDER — PREDNISONE 20 MG PO TABS
40.0000 mg | ORAL_TABLET | Freq: Every day | ORAL | Status: DC
  Administered 2019-08-19: 40 mg via ORAL
  Filled 2019-08-18: qty 2

## 2019-08-18 MED ORDER — ALBUTEROL SULFATE HFA 108 (90 BASE) MCG/ACT IN AERS
6.0000 | INHALATION_SPRAY | Freq: Once | RESPIRATORY_TRACT | Status: AC
  Administered 2019-08-18: 6 via RESPIRATORY_TRACT
  Filled 2019-08-18: qty 6.7

## 2019-08-18 MED ORDER — ALBUTEROL SULFATE (2.5 MG/3ML) 0.083% IN NEBU
2.5000 mg | INHALATION_SOLUTION | RESPIRATORY_TRACT | Status: DC | PRN
  Administered 2019-08-19: 2.5 mg via RESPIRATORY_TRACT
  Filled 2019-08-18: qty 3

## 2019-08-18 MED ORDER — UMECLIDINIUM BROMIDE 62.5 MCG/INH IN AEPB
1.0000 | INHALATION_SPRAY | Freq: Every day | RESPIRATORY_TRACT | Status: DC
  Filled 2019-08-18: qty 7

## 2019-08-18 MED ORDER — ENOXAPARIN SODIUM 40 MG/0.4ML ~~LOC~~ SOLN
40.0000 mg | SUBCUTANEOUS | Status: DC
  Administered 2019-08-19 (×2): 40 mg via SUBCUTANEOUS
  Filled 2019-08-18 (×2): qty 0.4

## 2019-08-18 MED ORDER — MOMETASONE FURO-FORMOTEROL FUM 200-5 MCG/ACT IN AERO
1.0000 | INHALATION_SPRAY | Freq: Two times a day (BID) | RESPIRATORY_TRACT | Status: DC
  Filled 2019-08-18 (×2): qty 8.8

## 2019-08-18 NOTE — H&P (Signed)
History and Physical    Marcus [name] [id number] DOB: [birth year] DOA: 08/18/2019  PCP: Pamella Pert, MD  Patient coming from: Home  I have personally briefly reviewed patient's old medical records in [place]  Chief Complaint: SOB  HPI: Marcus [name] is a 59 y.o. male with medical history significant of COPD, RA with h/o Felty syndrome s/p splenectomy.  Still has chronic leukopenia.  Not on any treatment currently for RA, not on any chronic nebs for COPD.  Hasnt seen doctor in many months secondary to COVID he says.  Pt present to ED with c/o SOB, wheezing, productive cough.  Symptoms onset 6 weeks ago.  Persistent since then, severe.  Associated with loss of voice.  Has B ankle swelling that he associates (likely correctly) with his RA.   ED Course: WBC 1.8k (chronic and baseline since 2017).  Given nebs, steroids in ED.  COVID neg.   Review of Systems: As per HPI, otherwise all review of systems negative.  Past Medical History:  Diagnosis Date  . COPD (chronic obstructive pulmonary disease) ([place])   . 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 Hernia, epigastric   . Pancytopenia ([place])   . Seropositive rheumatoid arthritis ([place])   . Tobacco use disorder     No past surgical history on file.   reports that he has been smoking cigarettes. He has been smoking about 1.00 pack per day. He has never used smokeless tobacco. He reports current drug use. Drug: Marijuana. He reports that he does not drink alcohol.  Allergies  Allergen Reactions  . Levaquin [Levofloxacin In D5w] Other (See Comments)    Chest pain  . Methotrexate Other (See Comments)    Chest pain    Family History  Problem Relation Age of Onset  . CAD Brother   . COPD Brother      Prior to Admission medications   Medication Sig Start Date End Date Taking? Authorizing Provider  predniSONE (DELTASONE) 10 MG tablet 20mg  daily for 5 days, then decrease to 10mg  daily. Patient not taking: Reported on 08/18/2019  03/03/17   Carrie Mew, MD    Physical Exam: Vitals:   08/18/19 2145 08/18/19 2200 08/18/19 2215 08/18/19 2230  BP: 140/86 131/85 131/86 136/83  Pulse: 78 83 81 78  Resp: (!) 21 (!) 24 (!) 22 (!) 24  Temp:      TempSrc:      SpO2: 95% 94% 91% 92%  Weight:      Height:        Constitutional: NAD, calm, comfortable Eyes: PERRL, lids and conjunctivae normal ENMT: Mucous membranes are moist. Posterior pharynx clear of any exudate or lesions.Normal dentition.  Neck: normal, supple, no masses, no thyromegaly Respiratory: B wheezing. Cardiovascular: Regular rate and rhythm, no murmurs / rubs / gallops. No extremity edema. 2+ pedal pulses. No carotid bruits.  Abdomen: no tenderness, no masses palpated. No hepatosplenomegaly. Bowel sounds positive.  Musculoskeletal: B ankle swelling, Also B wrists, 1st, 2nd, and 4th MCP joints B with sparing of the 3rd MCP bilaterally. Skin: no rashes, lesions, ulcers. No induration Neurologic: CN 2-12 grossly intact. Sensation intact, DTR normal. Strength 5/5 in all 4.  Psychiatric: Normal judgment and insight. Alert and oriented x 3. Normal mood.    Labs on Admission: I have personally reviewed following labs and imaging studies  CBC: Recent Labs  Lab 08/18/19 1800  WBC 1.8*  NEUTROABS 0.2*  HGB 18.0*  HCT 58.1*  MCV 90.4  PLT 176  Basic Metabolic Panel: Recent Labs  Lab 08/18/19 1800  NA 138  K 3.9  CL 102  CO2 25  GLUCOSE 108*  BUN 13  CREATININE 0.75  CALCIUM 8.6*   GFR: Estimated Creatinine Clearance: 103.9 mL/min (by C-G formula based on SCr of 0.75 mg/dL). Liver Function Tests: Recent Labs  Lab 08/18/19 1800  AST 24  ALT 17  ALKPHOS 95  BILITOT 0.8  PROT 7.7  ALBUMIN 2.8*   No results for input(s): LIPASE, AMYLASE in the last 168 hours. No results for input(s): AMMONIA in the last 168 hours. Coagulation Profile: No results for input(s): INR, PROTIME in the last 168 hours. Cardiac Enzymes: No results for  input(s): CKTOTAL, CKMB, CKMBINDEX, TROPONINI in the last 168 hours. BNP (last 3 results) No results for input(s): PROBNP in the last 8760 hours. HbA1C: No results for input(s): HGBA1C in the last 72 hours. CBG: No results for input(s): GLUCAP in the last 168 hours. Lipid Profile: No results for input(s): CHOL, HDL, LDLCALC, TRIG, CHOLHDL, LDLDIRECT in the last 72 hours. Thyroid Function Tests: No results for input(s): TSH, T4TOTAL, FREET4, T3FREE, THYROIDAB in the last 72 hours. Anemia Panel: No results for input(s): VITAMINB12, FOLATE, FERRITIN, TIBC, IRON, RETICCTPCT in the last 72 hours. Urine analysis:    Component Value Date/Time   COLORURINE AMBER (A) 03/03/2017 1707   APPEARANCEUR CLOUDY (A) 03/03/2017 1707   LABSPEC 1.027 03/03/2017 1707   PHURINE 5.0 03/03/2017 1707   GLUCOSEU NEGATIVE 03/03/2017 1707   HGBUR NEGATIVE 03/03/2017 1707   BILIRUBINUR NEGATIVE 03/03/2017 1707   KETONESUR NEGATIVE 03/03/2017 1707   PROTEINUR 30 (A) 03/03/2017 1707   UROBILINOGEN 0.2 10/21/2012 1737   NITRITE NEGATIVE 03/03/2017 1707   LEUKOCYTESUR NEGATIVE 03/03/2017 1707    Radiological Exams on Admission: DG Chest 2 View  Result Date: 08/18/2019 CLINICAL DATA:  Shortness of breath EXAM: CHEST - 2 VIEW COMPARISON:  03/03/2017 FINDINGS: There is hyperinflation of the lungs compatible with COPD. Heart is normal size. No confluent opacities or effusions. No acute bony abnormality. IMPRESSION: COPD.  No active disease. Electronically Signed   By: Rolm Baptise M.D.   On: 08/18/2019 18:21    EKG: Independently reviewed.  Assessment/Plan Principal Problem:   COPD with acute exacerbation (HCC) Active Problems:   Leukopenia   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HCC)   Seropositive rheumatoid arthritis ([place])    1. COPD exacerbation - 1. COPD pathway 2. Prednisone 3. Inh steroid, LABA, LAMA 4. PRN SABA 5. Adult wheeze protocol 6. Cont pulse ox 7. ABx: given leukopenia and h/o splenectomy feel patient  qualifies as "immunosuppressed" and therefore will add pseudomonas coverage as per pathway recommendation: so cefepime 2g Q8H IV 8. Repeat CBC/BMP in AM 9. Nicotine patch PRN 2. RA, leukopenia, Felty's syndrome - 1. S/p splenectomy in 2017 2. Not on any chronic immunosuppressives 3. Pt with classic findings of active RA on exam 4. Getting put on steroids for COPD exacerbation above 5. But needs to follow up outpt with a rheumatologist (as I discussed with patient) for long term care.  DVT prophylaxis: Lovenox Code Status: Full Code Family Communication: Family at bedside Disposition Plan: Home after breathing improved Consults called: None Admission status: Place in 8    Deja Kaigler, [place] [facility]  How to contact the [facility] Attending or Consulting provider [place] or covering provider during after hours [place], for this patient?  1. Check the care team in [facility] and look for a) attending/consulting TRH provider listed  and b) the [facility] team listed 2. Log into [email or web address]  Amion Physician Scheduling and messaging for groups and whole hospitals  On call and physician scheduling software for group practices, residents, hospitalists and other medical providers for call, clinic, rotation and shift schedules. On[employer] is a hospital-wide system for scheduling doctors and paging doctors on call. EasyPlot is for scientific plotting and data analysis.  [email or web address]  and use [place]'s universal password to access. If you do not have the password, please contact the hospital operator.  3. Locate the [facility] provider you are looking for under [facility] and page to a number that you can be directly reached. 4. If you still have difficulty reaching the provider, please page the [facility] (Director on Call) for the Hospitalists listed on amion for assistance.  08/18/2019, 11:58 PM

## 2019-08-18 NOTE — ED Notes (Signed)
Pt O2 saturation dropped to 88% on room air. Pt placed on 2L [place].

## 2019-08-18 NOTE — ED Notes (Signed)
Patient stated hernia is at baseline size wise,

## 2019-08-18 NOTE — ED Triage Notes (Signed)
C/o shortness of breath, cough and congestion. Stated hx of COPD.

## 2019-08-18 NOTE — ED Provider Notes (Signed)
[place] EMERGENCY DEPARTMENT Provider Note   CSN: [id number] Arrival date & time: 08/18/19  1713     History Chief Complaint  Patient presents with  . Shortness of Breath  . Cough    Marcus [name] is a 59 y.o. male with PMH significant for Felty syndrome s/p splenectomy 2017, pancytopenia, and COPD who presents to the ED with a 6-week history of progressively worsening shortness of breath, cough productive of yellowish sputum, and congestion.  Patient states that he has been out of his breathing treatments for several months, due to COVID-19 pandemic and his reported inability to see his primary care provider.  He is accompanied by his brother who states that he has been progressively more dyspneic on exertion.  Patient also states that he has lost his voice entirely and that it has been persistent for the entire 6-week duration.  He states that it happened before, most recently in December 2020.  He was in communication with general surgery about a procedure for his large ventral hernia, however they wanted him to get his respiratory symptoms better controlled prior to surgery.  He also has some ankle swelling, but states that it is due to his RA.  He endorses a 45-pack-year smoking history.  He denies any fevers or chills, headache or dizziness, hemoptysis, chest pain, orthopnea, nausea or vomiting, dysphagia, diminished appetite, weight change, urinary symptoms, or changes in bowel habits.      HPI     Past Medical History:  Diagnosis Date  . COPD (chronic obstructive pulmonary disease) ([place])   . Felty syndrome ([place])   . Hernia, epigastric   . Pancytopenia ([place])   . Seropositive rheumatoid arthritis ([place])   . Tobacco use disorder     Patient Active Problem List   Diagnosis Date Noted  . COPD with acute exacerbation ([place]) 08/18/2019  . 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 Anemia 11/14/2015  . Seropositive rheumatoid arthritis ([place])   . Chronic obstructive  pulmonary disease ([place])   . Protein-calorie malnutrition, severe 07/24/2015  . Pancytopenia ([place]) 07/23/2015  . 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[place]) 06/06/2013  . Leukopenia 10/21/2012  . 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 Abnormal EKG 10/21/2012  . Joint pain 10/21/2012  . Splenomegaly 10/21/2012    No past surgical history on file.     Family History  Problem Relation Age of Onset  . CAD Brother   . COPD Brother     Social History   Tobacco Use  . Smoking status: Current Every Day Smoker    Packs/day: 1.00    Types: Cigarettes  . Smokeless tobacco: Never Used  Substance Use Topics  . Alcohol use: No    Alcohol/week: 0.0 standard drinks  . Drug use: Yes    Types: Marijuana    Home Medications Prior to Admission medications   Medication Sig Start Date End Date Taking? Authorizing Provider  albuterol (PROVENTIL HFA) 108 (90 Base) MCG/ACT inhaler Inhale into the lungs. 07/16/16 07/16/17  [provider]  predniSONE (DELTASONE) 10 MG tablet 20mg  daily for 5 days, then decrease to 10mg  daily. 03/03/17   Carrie Mew, MD  sucralfate (CARAFATE) 1 g tablet Take 1 g by mouth 3 (three) times daily.     [provider]  Vitamin D, Ergocalciferol, (DRISDOL) 50000 units CAPS capsule Take one capsule once a week for 12 weeks 05/22/17   [provider]    Allergies    Levaquin [levofloxacin in d5w] and Methotrexate  Review of Systems  Review of Systems  Physical Exam Updated Vital Signs BP 136/83   Pulse 78   Temp 98.1 F (36.7 C) (Oral)   Resp (!) 24   Ht 5\' 10"  (1.778 m)   Wt 83.9 kg   SpO2 92%   BMI 26.54 kg/m   Physical Exam Vitals and nursing note reviewed. Exam conducted with a chaperone present.  Constitutional:      Appearance: He is ill-appearing.  HENT:     Head: Normocephalic and atraumatic.  Eyes:     General: No scleral icterus.    Conjunctiva/sclera: Conjunctivae normal.  Cardiovascular:     Rate and Rhythm: Normal rate and regular  rhythm.     Pulses: Normal pulses.     Heart sounds: Normal heart sounds.  Pulmonary:     Comments: Increased work of breathing.  Tachypneic in 30s.  Oxygenating 90 to 92% on RA.  Mild wheezing auscultated diffusely.  Symmetric chest rise. Abdominal:     Comments: Large ventral hernia.  No significant abdominal TTP.  No guarding.  Skin:    General: Skin is dry.     Capillary Refill: Capillary refill takes less than 2 seconds.  Neurological:     Mental Status: He is alert and oriented to person, place, and time.     GCS: GCS eye subscore is 4. GCS verbal subscore is 5. GCS motor subscore is 6.  Psychiatric:        Mood and Affect: Mood normal.        Behavior: Behavior normal.        Thought Content: Thought content normal.     ED Results / Procedures / Treatments   Labs (all labs ordered are listed, but only abnormal results are displayed) Labs Reviewed  CBC WITH DIFFERENTIAL/PLATELET - Abnormal; Notable for the following components:      Result Value   WBC 1.8 (*)    RBC 6.43 (*)    Hemoglobin 18.0 (*)    HCT 58.1 (*)    RDW 17.2 (*)    Neutro Abs 0.2 (*)    All other components within normal limits  COMPREHENSIVE METABOLIC PANEL - Abnormal; Notable for the following components:   Glucose, Bld 108 (*)    Calcium 8.6 (*)    Albumin 2.8 (*)    All other components within normal limits  SARS CORONAVIRUS 2 BY RT PCR (HOSPITAL ORDER, [facility])  PATHOLOGIST SMEAR REVIEW    EKG EKG Interpretation  Date/Time:  Thursday Aug 18 2019 17:13:31 EDT Ventricular Rate:  89 PR Interval:  162 QRS Duration: 152 QT Interval:  412 QTC Calculation: 501 R Axis:   -90 Text Interpretation: Normal sinus rhythm Right bundle branch block Abnormal ECG since last tracing no significant change Confirmed by Malvin Johns [id number]) on 08/18/2019 7:38:31 PM   Radiology DG Chest 2 View  Result Date: 08/18/2019 CLINICAL DATA:  Shortness of breath EXAM: CHEST - 2 VIEW  COMPARISON:  03/03/2017 FINDINGS: There is hyperinflation of the lungs compatible with COPD. Heart is normal size. No confluent opacities or effusions. No acute bony abnormality. IMPRESSION: COPD.  No active disease. Electronically Signed   By: Rolm Baptise M.D.   On: 08/18/2019 18:21    Procedures Procedures (including critical care time)  Medications Ordered in ED Medications  magnesium sulfate IVPB 2 g 50 mL (has no administration in time range)  enoxaparin (LOVENOX) injection 40 mg (has no administration in time range)  predniSONE (DELTASONE) tablet  40 mg (has no administration in time range)  mometasone-formoterol (DULERA) 200-5 MCG/ACT inhaler 1 puff (has no administration in time range)  albuterol (PROVENTIL) (2.5 MG/3ML) 0.083% nebulizer solution 2.5 mg (has no administration in time range)  umeclidinium bromide (INCRUSE ELLIPTA) 62.5 MCG/INH 1 puff (has no administration in time range)  ceFEPIme (MAXIPIME) 2 g in sodium chloride 0.9 % 100 mL IVPB (has no administration in time range)  methylPREDNISolone sodium succinate (SOLU-MEDROL) 125 mg/2 mL injection 80 mg (80 mg Intravenous Given 08/18/19 2151)  albuterol (VENTOLIN HFA) 108 (90 Base) MCG/ACT inhaler 6 puff (6 puffs Inhalation Given 08/18/19 2145)  ipratropium (ATROVENT HFA) inhaler 2 puff (2 puffs Inhalation Given 08/18/19 2145)    ED Course  I have reviewed the triage vital signs and the nursing notes.  Pertinent labs & imaging results that were available during my care of the patient were reviewed by me and considered in my medical decision making (see chart for details).  Clinical Course as of Aug 18 2327  Thu Aug 18, 2019  2325 Spoke with hospitalist who will see and admit patient given new oxygen requirement.    [GG]    Clinical Course User Index [GG] Corena Herter, PA-C   MDM Rules/Calculators/A&P                      CBC demonstrates leukopenia, consistent with patient's baseline.  CMP is unremarkable.  EKG  is reviewed and demonstrates RBBB, but no significant change from prior tracings.  I personally viewed plain films obtained of chest which demonstrates hyperinflation compatible with COPD, but no acute cardiopulmonary findings.  COVID-19 testing obtained and is negative.  Patient is demonstrating increased work of breathing on physical exam and there is wheezing auscultated diffusely.  Provided 80 mg Solu-Medrol in addition to albuterol and ipratropium.  On reassessment, patient feels mildly improved, but is still demonstrating increased work of breathing.  He is at 91% while on 2 L supplemental O2.  Will add IV magnesium.  This is a new oxygen requirement.  Will consult unassigned medicine for admission in setting up hypoxia in setting of COPD exacerbation.  He is followed by [facility].  Discussed with Dr. Vanita Panda who agrees with assessment and plan.  Spoke with hospitalist who will see and admit patient.   Final Clinical Impression(s) / ED Diagnoses Final diagnoses:  COPD exacerbation [facility])  Hypoxia    Rx / DC Orders ED Discharge Orders    None       Reita Chard 08/18/19 2329    Carmin Muskrat, MD 08/24/19 2119

## 2019-08-19 ENCOUNTER — Encounter (HOSPITAL_COMMUNITY): Payer: Self-pay | Admitting: Internal Medicine

## 2019-08-19 ENCOUNTER — Observation Stay (HOSPITAL_COMMUNITY): Payer: Medicaid Other

## 2019-08-19 DIAGNOSIS — J9601 Acute respiratory failure with hypoxia: Secondary | ICD-10-CM | POA: Diagnosis present

## 2019-08-19 DIAGNOSIS — J441 Chronic obstructive pulmonary disease with (acute) exacerbation: Principal | ICD-10-CM

## 2019-08-19 DIAGNOSIS — Z23 Encounter for immunization: Secondary | ICD-10-CM | POA: Diagnosis not present

## 2019-08-19 DIAGNOSIS — R491 Aphonia: Secondary | ICD-10-CM | POA: Diagnosis present

## 2019-08-19 DIAGNOSIS — F17210 Nicotine dependence, cigarettes, uncomplicated: Secondary | ICD-10-CM | POA: Diagnosis present

## 2019-08-19 DIAGNOSIS — Z20822 Contact with and (suspected) exposure to covid-19: Secondary | ICD-10-CM | POA: Diagnosis present

## 2019-08-19 DIAGNOSIS — Z8249 Family history of ischemic heart disease and other diseases of the circulatory system: Secondary | ICD-10-CM | POA: Diagnosis not present

## 2019-08-19 DIAGNOSIS — K439 Ventral hernia without obstruction or gangrene: Secondary | ICD-10-CM | POA: Diagnosis present

## 2019-08-19 DIAGNOSIS — I714 Abdominal aortic aneurysm, without rupture: Secondary | ICD-10-CM | POA: Diagnosis present

## 2019-08-19 DIAGNOSIS — Z888 Allergy status to other drugs, medicaments and biological substances status: Secondary | ICD-10-CM | POA: Diagnosis not present

## 2019-08-19 DIAGNOSIS — Z7952 Long term (current) use of systemic steroids: Secondary | ICD-10-CM | POA: Diagnosis not present

## 2019-08-19 DIAGNOSIS — Z881 Allergy status to other antibiotic agents status: Secondary | ICD-10-CM | POA: Diagnosis not present

## 2019-08-19 DIAGNOSIS — D751 Secondary polycythemia: Secondary | ICD-10-CM | POA: Diagnosis present

## 2019-08-19 DIAGNOSIS — Z825 Family history of asthma and other chronic lower respiratory diseases: Secondary | ICD-10-CM | POA: Diagnosis not present

## 2019-08-19 DIAGNOSIS — M0500 Felty's syndrome, unspecified site: Secondary | ICD-10-CM | POA: Diagnosis present

## 2019-08-19 DIAGNOSIS — Z9081 Acquired absence of spleen: Secondary | ICD-10-CM | POA: Diagnosis not present

## 2019-08-19 DIAGNOSIS — I4510 Unspecified right bundle-branch block: Secondary | ICD-10-CM | POA: Diagnosis present

## 2019-08-19 DIAGNOSIS — R0602 Shortness of breath: Secondary | ICD-10-CM | POA: Diagnosis not present

## 2019-08-19 LAB — BASIC METABOLIC PANEL
Anion gap: 14 (ref 5–15)
BUN: 12 mg/dL (ref 6–20)
CO2: 22 mmol/L (ref 22–32)
Calcium: 8.6 mg/dL — ABNORMAL LOW (ref 8.9–10.3)
Chloride: 101 mmol/L (ref 98–111)
Creatinine, Ser: 0.87 mg/dL (ref 0.61–1.24)
GFR calc Af Amer: >60 mL/min (ref >60)
GFR calc non Af Amer: >60 mL/min (ref >60)
Glucose, Bld: 259 mg/dL — ABNORMAL HIGH (ref 70–99)
Potassium: 3.5 mmol/L (ref 3.5–5.1)
Sodium: 137 mmol/L (ref 135–145)

## 2019-08-19 LAB — CBC
HCT: 57.5 % — ABNORMAL HIGH (ref 39.0–52.0)
Hemoglobin: 17.7 g/dL — ABNORMAL HIGH (ref 13.0–17.0)
MCH: 27.6 pg (ref 26.0–34.0)
MCHC: 30.8 g/dL (ref 30.0–36.0)
MCV: 89.7 fL (ref 80.0–100.0)
Platelets: 205 10*3/uL (ref 150–400)
RBC: 6.41 MIL/uL — ABNORMAL HIGH (ref 4.22–5.81)
RDW: 17.1 % — ABNORMAL HIGH (ref 11.5–15.5)
WBC: 0.8 10*3/uL — CL (ref 4.0–10.5)
nRBC: 0 % (ref 0.0–0.2)

## 2019-08-19 LAB — PATHOLOGIST SMEAR REVIEW

## 2019-08-19 LAB — HIV ANTIBODY (ROUTINE TESTING W REFLEX): HIV Screen 4th Generation wRfx: NONREACTIVE

## 2019-08-19 IMAGING — CT CT ABD-PELV W/ CM
2 of 5 series · 16 of 46 positions shown, 18 images · IV contrast (APPLIED)
Comparison: CT abdomen pelvis dated [DATE].

CLINICAL DATA: Respiratory distress. Large ventral hernia.

EXAM:
CT ABDOMEN AND PELVIS WITH CONTRAST
TECHNIQUE: Multidetector CT imaging of the abdomen and pelvis was performed
using the standard protocol following bolus administration of
intravenous contrast.
CONTRAST:  <See Chart> OMNIPAQUE IOHEXOL 300 MG/ML  SOLN

[Series 3: abdomen 5.0 · axial · 0.80mm/px · z∈[+929,+1329]mm · 13 of 94 slices shown, 15 images]
[im 7/94  soft-tissue]
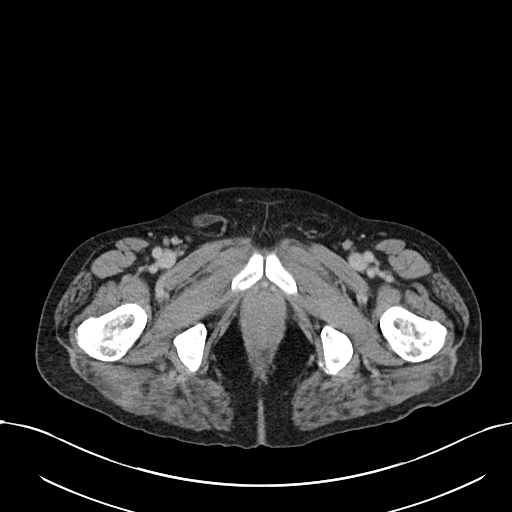
[im 7/94  bone]
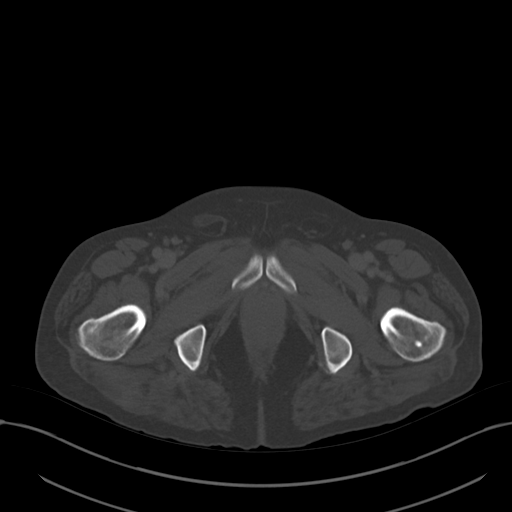
[im 13/94  soft-tissue]
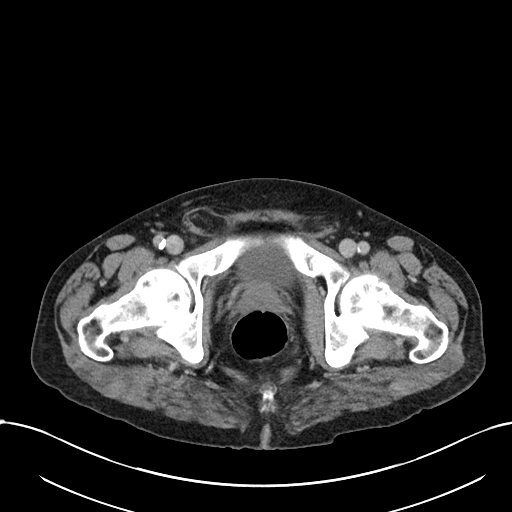
[im 19/94  soft-tissue]
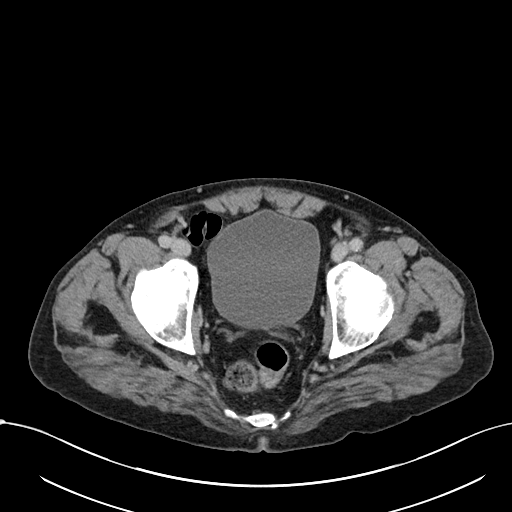
[im 25/94  soft-tissue]
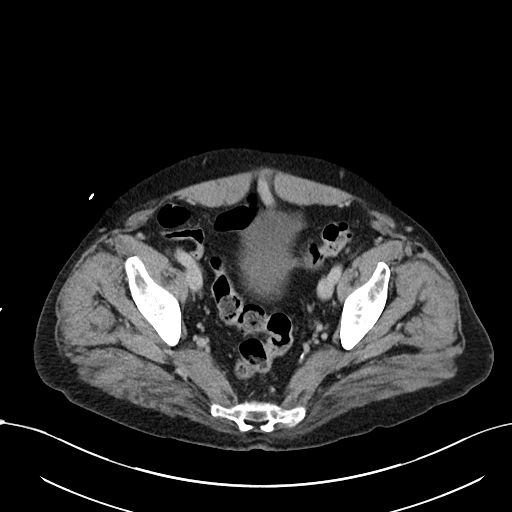
[im 32/94  soft-tissue]
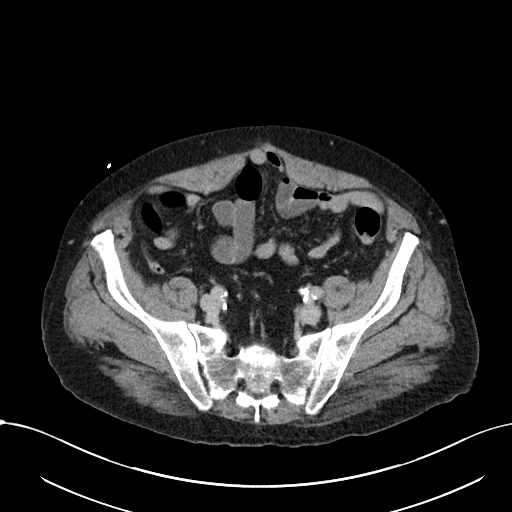
[im 38/94  soft-tissue]
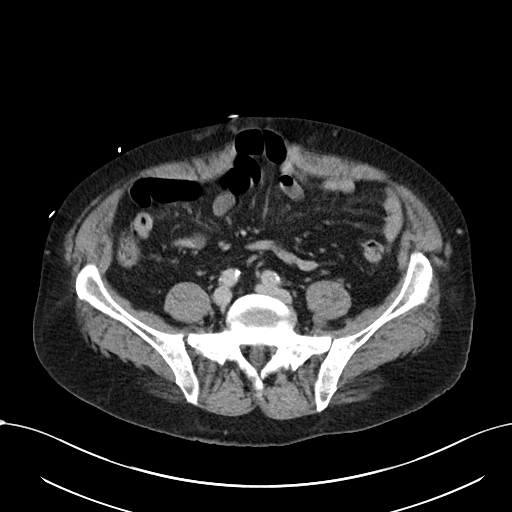
[im 50/94  soft-tissue]
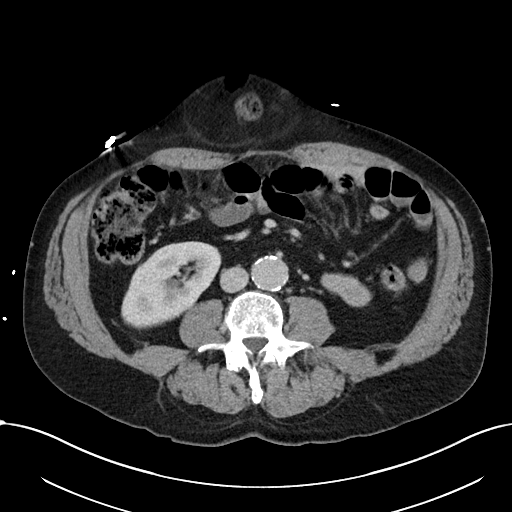
[im 56/94  soft-tissue]
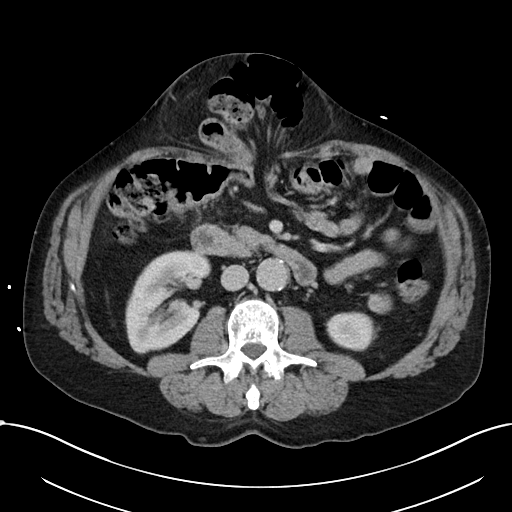
[im 63/94  soft-tissue]
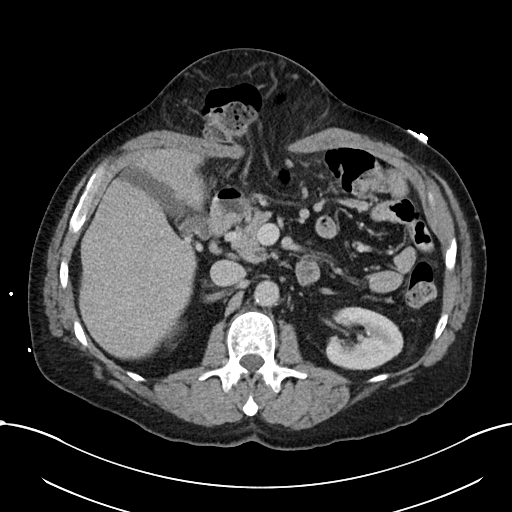
[im 63/94  bone]
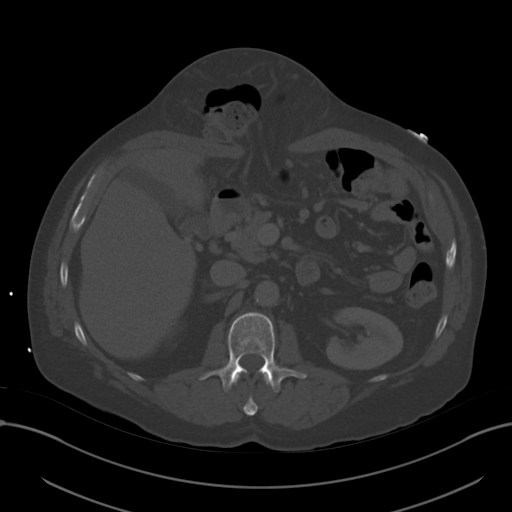
[im 69/94  soft-tissue]
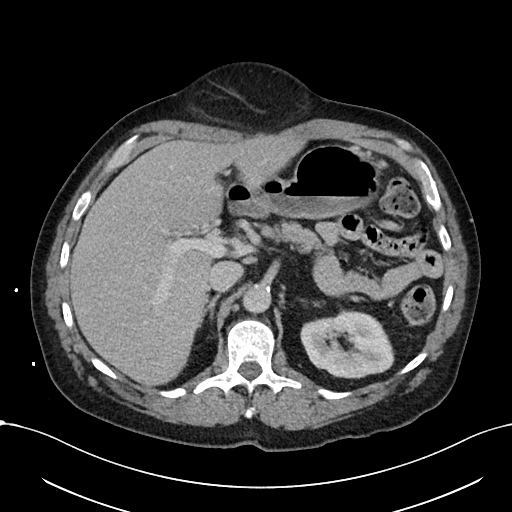
[im 75/94  soft-tissue]
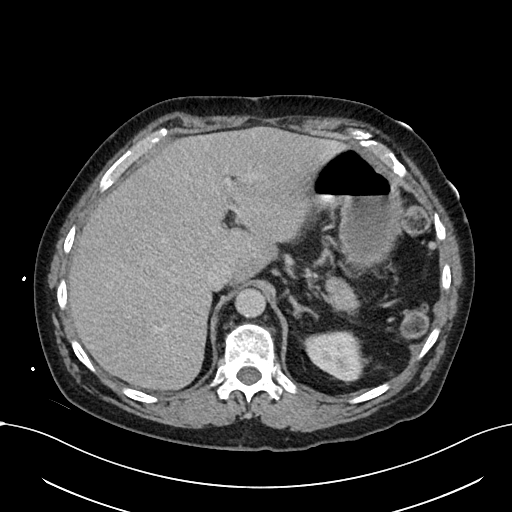
[im 81/94  soft-tissue]
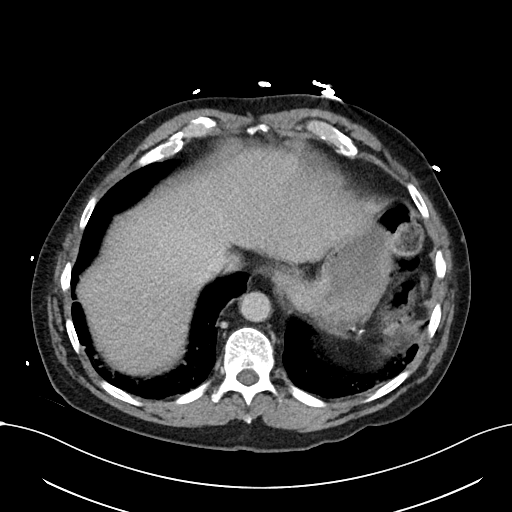
[im 87/94  soft-tissue]
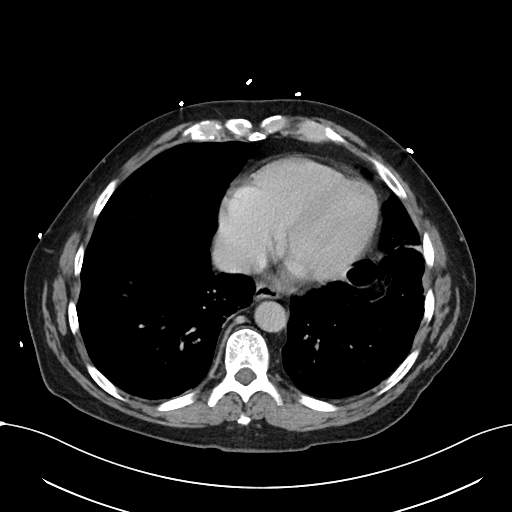

[Series 6: abdomen 3.0 mpr cor · coronal · 0.71mm/px · 3 of 109 slices shown]
[im 37/109  soft-tissue]
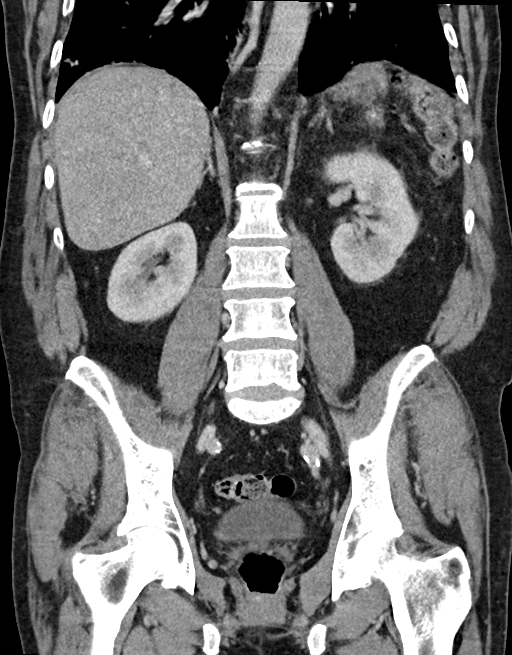
[im 49/109  soft-tissue]
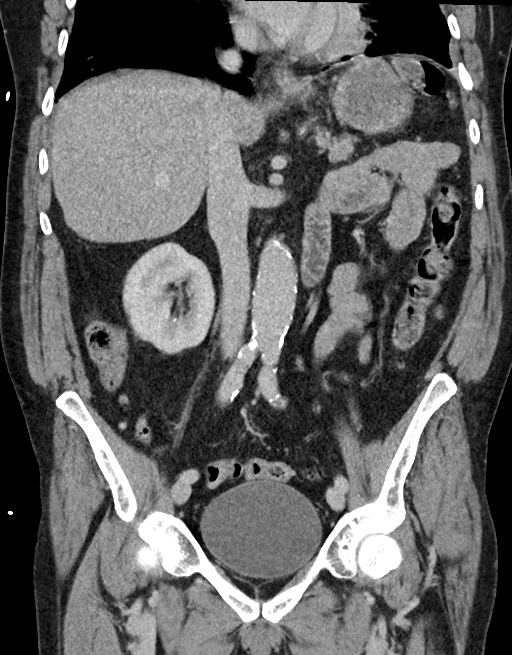
[im 61/109  soft-tissue]
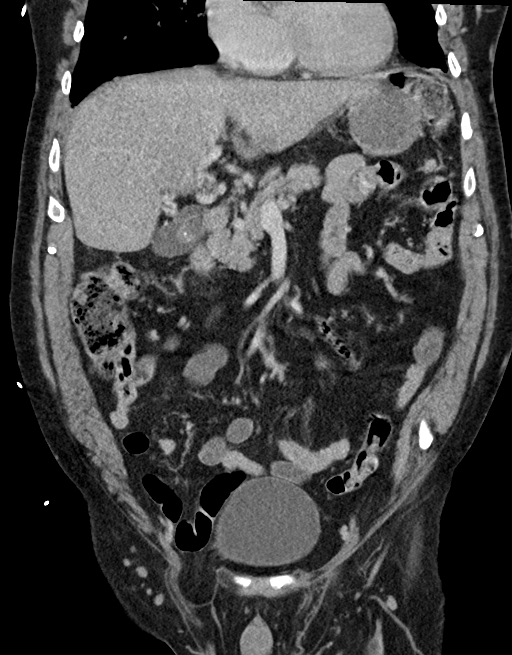

[16 of 46 positions shown; findings below may reference images not displayed]

FINDINGS: Lower chest: Bibasilar atelectasis and emphysema.

Hepatobiliary: No focal liver abnormality. Cholelithiasis. No
gallbladder wall thickening or biliary dilatation.

Pancreas: Unremarkable. No pancreatic ductal dilatation or
surrounding inflammatory changes.

Spleen: Interval splenectomy.

Adrenals/Urinary Tract: The adrenal glands are unremarkable. New
punctate bilateral renal calculi. No hydronephrosis. The bladder is
unremarkable.

Stomach/Bowel: Stomach is within normal limits. Appendix appears
normal. No evidence of bowel wall thickening, distention, or
inflammatory changes.

Vascular/Lymphatic: New infrarenal abdominal aortic aneurysm
measuring up to 3.1 cm. Aortic atherosclerosis. No enlarged
abdominal or pelvic lymph nodes.

Reproductive: Prostate is unremarkable.

Other: New large ventral abdominal hernia containing fat and
non-dilated transverse colon. Unchanged small bilateral fat
containing inguinal hernias. No free fluid or pneumoperitoneum.

Musculoskeletal: No acute or significant osseous findings.
IMPRESSION: 1. New large ventral abdominal hernia containing fat and non-dilated
transverse colon.
2. Interval splenectomy.
3. New punctate bilateral nephrolithiasis.
4. New 3.1 cm in for abdominal aortic aneurysm. Recommend followup
by ultrasound in 3 years. This recommendation follows ACR consensus
guidelines: White Paper of the ACR Incidental Findings Committee II
aneurysm NOS ([U5]-[U5])
5. Cholelithiasis.
6. Aortic Atherosclerosis ([U5]-[U5]).

## 2019-08-19 MED ORDER — METHYLPREDNISOLONE SODIUM SUCC 125 MG IJ SOLR
60.0000 mg | Freq: Two times a day (BID) | INTRAMUSCULAR | Status: DC
  Administered 2019-08-19 – 2019-08-20 (×3): 60 mg via INTRAVENOUS
  Filled 2019-08-19 (×3): qty 2

## 2019-08-19 MED ORDER — ENSURE ENLIVE PO LIQD
237.0000 mL | Freq: Two times a day (BID) | ORAL | Status: DC
  Administered 2019-08-20 (×2): 237 mL via ORAL

## 2019-08-19 MED ORDER — MORPHINE SULFATE (PF) 2 MG/ML IV SOLN
2.0000 mg | INTRAVENOUS | Status: DC | PRN
  Administered 2019-08-19 – 2019-08-20 (×4): 2 mg via INTRAVENOUS
  Filled 2019-08-19 (×4): qty 1

## 2019-08-19 MED ORDER — ALBUTEROL (5 MG/ML) CONTINUOUS INHALATION SOLN
INHALATION_SOLUTION | RESPIRATORY_TRACT | Status: AC
  Administered 2019-08-19: 10 mg/h via RESPIRATORY_TRACT
  Filled 2019-08-19: qty 20

## 2019-08-19 MED ORDER — IOHEXOL 300 MG/ML  SOLN
100.0000 mL | Freq: Once | INTRAMUSCULAR | Status: AC
  Administered 2019-08-19: 80 mL via INTRAVENOUS

## 2019-08-19 MED ORDER — IPRATROPIUM-ALBUTEROL 0.5-2.5 (3) MG/3ML IN SOLN
3.0000 mL | Freq: Four times a day (QID) | RESPIRATORY_TRACT | Status: DC
  Administered 2019-08-19: 3 mL via RESPIRATORY_TRACT
  Filled 2019-08-19: qty 3

## 2019-08-19 MED ORDER — AZITHROMYCIN 250 MG PO TABS
250.0000 mg | ORAL_TABLET | Freq: Every day | ORAL | Status: DC
  Administered 2019-08-19 – 2019-08-20 (×2): 250 mg via ORAL
  Filled 2019-08-19: qty 1

## 2019-08-19 MED ORDER — IPRATROPIUM-ALBUTEROL 0.5-2.5 (3) MG/3ML IN SOLN
3.0000 mL | Freq: Three times a day (TID) | RESPIRATORY_TRACT | Status: DC
  Administered 2019-08-19: 3 mL via RESPIRATORY_TRACT
  Filled 2019-08-19 (×2): qty 3

## 2019-08-19 MED ORDER — ALBUTEROL (5 MG/ML) CONTINUOUS INHALATION SOLN
10.0000 mg/h | INHALATION_SOLUTION | RESPIRATORY_TRACT | Status: DC
  Administered 2019-08-19: 10 mg/h via RESPIRATORY_TRACT
  Filled 2019-08-19: qty 20

## 2019-08-19 MED ORDER — MORPHINE SULFATE (PF) 2 MG/ML IV SOLN
2.0000 mg | INTRAVENOUS | Status: DC | PRN
  Administered 2019-08-19: 4 mg via INTRAVENOUS
  Filled 2019-08-19: qty 2

## 2019-08-19 MED ORDER — NICOTINE 21 MG/24HR TD PT24
21.0000 mg | MEDICATED_PATCH | Freq: Every day | TRANSDERMAL | Status: DC | PRN
  Administered 2019-08-19: 21 mg via TRANSDERMAL
  Filled 2019-08-19: qty 1

## 2019-08-19 MED ORDER — SENNOSIDES-DOCUSATE SODIUM 8.6-50 MG PO TABS
1.0000 | ORAL_TABLET | Freq: Two times a day (BID) | ORAL | Status: DC
  Filled 2019-08-19 (×2): qty 1

## 2019-08-19 MED ORDER — PNEUMOCOCCAL VAC POLYVALENT 25 MCG/0.5ML IJ INJ
0.5000 mL | INJECTION | INTRAMUSCULAR | Status: AC
  Administered 2019-08-20: 0.5 mL via INTRAMUSCULAR
  Filled 2019-08-19: qty 0.5

## 2019-08-19 NOTE — ED Notes (Signed)
Pt receiving continuous neb treatment, SpO2 decreased to 88%. MD paged. Pt currently 92% with nebulizer. MD returned paged, instructed RN to continue neb treatment and titrate oxygen as needed to maintain perfusion; verbal order given for bipap if needed. Will continue to monitor oxygen saturation and need for supplemental oxygen.

## 2019-08-19 NOTE — Progress Notes (Signed)
Initial Nutrition Assessment  DOCUMENTATION CODES:   Not applicable  INTERVENTION:  Provide Ensure Enlive po BID, each supplement provides 350 kcal and 20 grams of protein  Encourage adequate PO intake.   NUTRITION DIAGNOSIS:   Increased nutrient needs related to chronic illness(COPD) as evidenced by estimated needs.  GOAL:   Patient will meet greater than or equal to 90% of their needs  MONITOR:   PO intake, Supplement acceptance, Skin, Weight trends, Labs, I & O's  REASON FOR ASSESSMENT:   Consult Assessment of nutrition requirement/status  ASSESSMENT:   59 y.o. male with medical history significant of COPD, RA with h/o Felty syndrome s/p splenectomy, chronic leukopenia, presents with SOB, wheezing, productive cough  Pt reports having a good appetite currently and PTA with no difficulties. No recent meal completion recorded, however pt reports completing 100% of meals today. Usual body weight unknown. RD to order nutritional supplements to aid in caloric and protein needs.    Unable to complete Nutrition-Focused physical exam at this time.   Labs and medications reviewed.   Diet Order:   Diet Order            Diet Heart Room service appropriate? Yes; Fluid consistency: Thin  Diet effective now              EDUCATION NEEDS:   Not appropriate for education at this time  Skin:  Skin Assessment: Reviewed RN Assessment  Last BM:  Unknown  Height:   Ht Readings from Last 1 Encounters:  08/18/19 5\' 10"  (1.778 m)    Weight:   Wt Readings from Last 1 Encounters:  08/18/19 83.9 kg    BMI:  Body mass index is 26.54 kg/m.  Estimated Nutritional Needs:   Kcal:  2050-2250  Protein:  100-115 grams  Fluid:  >/= 2 L/day  Corrin Parker, MS, RD, LDN RD pager number/after hours weekend pager number on Amion.

## 2019-08-19 NOTE — ED Notes (Signed)
Pt transported to CT ?

## 2019-08-19 NOTE — ED Notes (Signed)
Neb treatment complete. Baseline 2L [place] placed on pt to determine oxygen needs. SpO2 88% on 2LNC; increased to 4L, SpO2 89%. Pt placed on nonrebreather at 12L and is currently 92-93%, HR 101, BP 142/67, RR 20. Will continue to monitor oxygen saturation and adjust supplemental O2 as needed.

## 2019-08-19 NOTE — ED Notes (Signed)
Pt reports pain rated 10/10 surrounding abdominal hernia, states it has been painful like this for about a month. Hernia soft, pt states it is normally more firm. Findings reported to MD, orders placed for CT and pain medication.

## 2019-08-19 NOTE — Evaluation (Addendum)
Occupational Therapy Evaluation Patient Details Name: Marcus [name] MRN: [id number] DOB: Apr 01, 1961 Today's Date: 08/19/2019    History of Present Illness Pt is a 59 y/o male admitted secondary to COPD exacerbation. PMH includes hernia, COPD, and tobacco use.    Clinical Impression   PTA pt living with mother, assisting in her care. Pt was independent for BADL/IADL. At time of eval, pt completing bed mobility at mod I and sit <> stands with supervision level of assist. Pt completed household level of functional mobility without DME or notable LOB. Pt started session on 4L [place], saturating well. With walking on RA, SpO2 was around 90% with increased DOE. Pt remained most stable and comfortable on 2L [place]. Began initiating ECS strategies for BADL routines. Will continue to follow acutely, but no post d/c OT is recommended.    Follow Up Recommendations  No OT follow up    Equipment Recommendations  None recommended by OT    Recommendations for Other Services       Precautions / Restrictions Precautions Precautions: Fall Restrictions Weight Bearing Restrictions: No      Mobility Bed Mobility Overal bed mobility: Modified Independent                Transfers Overall transfer level: Needs assistance Equipment used: None Transfers: Sit to/from Stand Sit to Stand: Supervision         General transfer comment: Supervision for safety.     Balance Overall balance assessment: Mild deficits observed, not formally tested                                         ADL either performed or assessed with clinical judgement   ADL Overall ADL's : Modified independent                                       General ADL Comments: Pt has ability to complete BADL at mod I level, but needs increased cueing and education for ECS strategies.     Vision Patient Visual Report: No change from baseline       Perception     Praxis      Pertinent  Vitals/Pain Pain Assessment: 0-10 Pain Score: 10-Worst pain ever Pain Location: hernia Pain Descriptors / Indicators: Aching Pain Intervention(s): Limited activity within patient's tolerance;Monitored during session;Repositioned     Hand Dominance     Extremity/Trunk Assessment Upper Extremity Assessment Upper Extremity Assessment: Generalized weakness   Lower Extremity Assessment Lower Extremity Assessment: Generalized weakness   Cervical / Trunk Assessment Cervical / Trunk Assessment: Normal   Communication Communication Communication: Other (comment)(softer, hoarse voice)   Cognition Arousal/Alertness: Awake/alert Behavior During Therapy: WFL for tasks assessed/performed Overall Cognitive Status: Within Functional Limits for tasks assessed                                     General Comments       Exercises     Shoulder Instructions      Home Living Family/patient expects to be discharged to:: Private residence Living Arrangements: Parent Available Help at Discharge: Family Type of Home: House Home Access: Stairs to enter Technical brewer of Steps: 4 Entrance Stairs-Rails: Right;Left;Can reach both Home Layout: One level  Bathroom Shower/Tub: Teacher, early years/pre: Standard     Home Equipment: Environmental consultant - 2 wheels;Cane - single point          Prior Functioning/Environment Level of Independence: Independent                 OT Problem List: Decreased strength;Decreased knowledge of use of DME or AE;Decreased activity tolerance;Cardiopulmonary status limiting activity      OT Treatment/Interventions: Self-care/ADL training;Therapeutic exercise;Patient/family education;Balance training;Energy conservation;Therapeutic activities;DME and/or AE instruction    OT Goals(Current goals can be found in the care plan section) Acute Rehab OT Goals Patient Stated Goal: to go home OT Goal Formulation: With patient Time For  Goal Achievement: 09/02/19 Potential to Achieve Goals: Good  OT Frequency: Min 2X/week   Barriers to D/C:            Co-evaluation PT/OT/SLP Co-Evaluation/Treatment: Yes Reason for Co-Treatment: For patient/therapist safety;To address functional/ADL transfers PT goals addressed during session: Mobility/safety with mobility;Balance OT goals addressed during session: ADL's and self-care      AM-PAC OT "6 Clicks" Daily Activity     Outcome Measure Help from another person eating meals?: None Help from another person taking care of personal grooming?: None Help from another person toileting, which includes using toliet, bedpan, or urinal?: None Help from another person bathing (including washing, rinsing, drying)?: None Help from another person to put on and taking off regular upper body clothing?: None Help from another person to put on and taking off regular lower body clothing?: None 6 Click Score: 24   End of Session Equipment Utilized During Treatment: Gait belt;Oxygen Nurse Communication: Mobility status  Activity Tolerance: Patient tolerated treatment well Patient left: in bed;with call bell/phone within reach  OT Visit Diagnosis: Unsteadiness on feet (R26.81);Other abnormalities of gait and mobility (R26.89)                Time: 1040-1058 OT Time Calculation (min): 18 min Charges:  OT General Charges $OT Visit: 1 Visit OT Evaluation $OT Eval Low Complexity: 1 Low  Zenovia Jarred, MSOT, OTR/L [facility] Office Number: [phone] Pager: [phone]  Zenovia Jarred 08/19/2019, 1:27 PM

## 2019-08-19 NOTE — ED Notes (Signed)
Titrated pt to 5L [place] and O2 sat only increased to 89%. NRB placed on pt and O2 sat now 93%. MD to be paged

## 2019-08-19 NOTE — Progress Notes (Signed)
Pt now saying he is having abd pain around his hernia, ongoing for past month.  Hernia soft.  Will get CT abd/pelvis to further evaluate.

## 2019-08-19 NOTE — Progress Notes (Addendum)
PROGRESS NOTE    Marcus [name]  [id number] DOB: 1960-06-11 DOA: 08/18/2019 PCP: Pamella Pert, MD  Brief Narrative:Marcus [name] is a 59 y.o. 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  Not on any treatment currently for RA, not on any chronic nebs for COPD.  Hasnt seen doctor in many months Pt present to ED with c/o SOB, wheezing, productive cough. ED Course: WBC 1.8k (chronic and baseline since 2017).  Given nebs, steroids in ED.  Assessment & Plan:   Acute hypoxic respiratory failure  COPD with acute exacerbation (HCC) -Clinically improving but still wheezing at this time and dyspneic with minimal activity, not at his baseline, currently requiring 4 L of O2, not on oxygen at baseline -Chest x-ray notable for COPD -Continue IV steroids today -Continue duo nebs, stop cefepime, add azithromycin -Attempt to wean O2  Tobacco abuse -Counseled, wearing a nicotine patch at this time  History of rheumatoid arthritis, leukopenia, Felty syndrome -Underwent splenectomy in 2017 -Discussed the patient's on importance of rheumatology follow-up  Ventral and right inguinal hernia -Patient reports having had these for a number of years -Nonobstructed, reducible -Tolerating diet, having bowel movements -Patient saw a Education officer, environmental at [facility] 5 to 6 months ago, was supposed to have surgery however things got delayed with Covid -Emphasized importance of follow-up with the surgeon for elective hernia repair  Abdominal aortic aneurysm -3.1 cm on CT, needs follow-up for this  Polycythemia -Likely secondary to COPD and/or splenectomy  DVT prophylaxis: Lovenox Code Status: Full code Family Communication: Discussed with patient in detail, no family at bedside Status is: Observation   The patient will require care spanning > 2 midnights and should be moved to inpatient  because: Inpatient level of care appropriate due to severity of illness, COPD, still wheezing, with oxygen requirement, requiring IV steroids and continuous nebulizations  Dispo: The patient is from: Home              Anticipated d/c is to: Home              Anticipated d/c date is: 2 days              Patient currently is not medically stable to d/c.    Procedures:   Antimicrobials:    Subjective: -Feels a little better, still short of breath with minimal activity, reports cough and wheezing -Has longstanding discomfort at the site of his hernias, denies any nausea vomiting, moving his bowels daily  Objective: Vitals:   08/19/19 0915 08/19/19 0930 08/19/19 0945 08/19/19 1000  BP: (!) 142/81  (!) 143/78 (!) 146/81  Pulse: 95 99 92 97  Resp: 19 (!) 22 19 (!) 21  Temp:      TempSrc:      SpO2: 93% 92% 93% 92%  Weight:      Height:       No intake or output data in the 24 hours ending 08/19/19 1045 Filed Weights   08/18/19 1721  Weight: 83.9 kg    Examination:  General exam: Chronically ill male sitting up in bed, appears much older than stated age, AAOx3 Respiratory system: Very poor air movement bilaterally, expiratory wheezes noted Cardiovascular system: S1 & S2 heard, RRR.   Gastrointestinal system: Soft, large ventral abdominal hernia which is soft and reducible, right inguinal hernia also noted which is also soft and reducible Central nervous system: Alert and oriented.  No focal neurological deficits. Extre flushed skin Psychiatry: Mood & affect appropriate.     Data Reviewed:   CBC: Recent Labs  Lab 08/18/19 1800 08/19/19 0439  WBC 1.8* 0.8*  NEUTROABS 0.2*  --   HGB 18.0* 17.7*  HCT 58.1* 57.5*  MCV 90.4 89.7  PLT 176 [id number]   Basic Metabolic Panel: Recent Labs  Lab 08/18/19 1800 08/19/19 0439  NA 138 137  K 3.9 3.5  CL 102 101  CO2 25 22  GLUCOSE 108* 259*  BUN 13 12  CREATININE 0.75 0.87  CALCIUM 8.6* 8.6*   GFR: Estimated Creatinine  Clearance: 95.6 mL/min (by C-G formula based on SCr of 0.87 mg/dL). Liver Function Tests: Recent Labs  Lab 08/18/19 1800  AST 24  ALT 17  ALKPHOS 95  BILITOT 0.8  PROT 7.7  ALBUMIN 2.8*   No results for input(s): LIPASE, AMYLASE in the last 168 hours. No results for input(s): AMMONIA in the last 168 hours. Coagulation Profile: No results for input(s): INR, PROTIME in the last 168 hours. Cardiac Enzymes: No results for input(s): CKTOTAL, CKMB, CKMBINDEX, TROPONINI in the last 168 hours. BNP (last 3 results) No results for input(s): PROBNP in the last 8760 hours. HbA1C: No results for input(s): HGBA1C in the last 72 hours. CBG: No results for input(s): GLUCAP in the last 168 hours. Lipid Profile: No results for input(s): CHOL, HDL, LDLCALC, TRIG, CHOLHDL, LDLDIRECT in the last 72 hours. Thyroid Function Tests: No results for input(s): TSH, T4TOTAL, FREET4, T3FREE, THYROIDAB in the last 72 hours. Anemia Panel: No results for input(s): VITAMINB12, FOLATE, FERRITIN, TIBC, IRON, RETICCTPCT in the last 72 hours. Urine analysis:    Component Value Date/Time   COLORURINE AMBER (A) 03/03/2017 1707   APPEARANCEUR CLOUDY (A) 03/03/2017 1707   LABSPEC 1.027 03/03/2017 1707   PHURINE 5.0 03/03/2017 1707   GLUCOSEU NEGATIVE 03/03/2017 1707   HGBUR NEGATIVE 03/03/2017 1707   BILIRUBINUR NEGATIVE 03/03/2017 1707   KETONESUR NEGATIVE 03/03/2017 1707   PROTEINUR 30 (A) 03/03/2017 1707   UROBILINOGEN 0.2 10/21/2012 1737   NITRITE NEGATIVE 03/03/2017 1707   LEUKOCYTESUR NEGATIVE 03/03/2017 1707   Sepsis Labs: @LABRCNTIP (procalcitonin:4,lacticidven:4)  ) Recent Results (from the past 240 hour(s))  SARS Coronavirus 2 by RT PCR (hospital order, performed in [place] hospital lab) Nasopharyngeal Nasopharyngeal Swab     Status: None   Collection Time: 08/18/19  9:54 PM   Specimen: Nasopharyngeal Swab  Result Value Ref Range Status   SARS Coronavirus 2 NEGATIVE NEGATIVE Final     Comment: (NOTE) SARS-CoV-2 target nucleic acids are NOT DETECTED. The SARS-CoV-2 RNA is generally detectable in upper and lower respiratory specimens during the acute phase of infection. The lowest concentration of SARS-CoV-2 viral copies this assay can detect is 250 copies / mL. A negative result does not preclude SARS-CoV-2 infection and should not be used as the sole basis for treatment or other patient management decisions.  A negative result may occur with improper specimen collection / handling, submission of specimen other than nasopharyngeal swab, presence of viral mutation(s) within the areas targeted by this assay, and inadequate number of viral copies (<250 copies / mL). A negative result must be combined with clinical observations, patient history, and epidemiological information. Fact Sheet for Patients:   StrictlyIdeas.no Fact Sheet for Healthcare Providers: BankingDealers.co.za This test is not yet approved or cleared  by the [place] FDA and has been authorized for detection and/or diagnosis of SARS-CoV-2 by FDA under an Emergency Use Authorization (  EUA).  This EUA will remain in effect (meaning this test can be used) for the duration of the COVID-19 declaration under Section 564(b)(1) of the Act, 21 U.S.C. section 360bbb-3(b)(1), unless the authorization is terminated or revoked sooner. Performed at [facility], [place] [address] [place], [place] [address]          Radiology Studies: DG Chest 2 View  Result Date: 08/18/2019 CLINICAL DATA:  Shortness of breath EXAM: CHEST - 2 VIEW COMPARISON:  03/03/2017 FINDINGS: There is hyperinflation of the lungs compatible with COPD. Heart is normal size. No confluent opacities or effusions. No acute bony abnormality. IMPRESSION: COPD.  No active disease. Electronically Signed   By: Rolm Baptise M.D.   On: 08/18/2019 18:21   CT ABDOMEN PELVIS W CONTRAST  Result Date:  08/19/2019 CLINICAL DATA:  Respiratory distress. Large ventral hernia. EXAM: CT ABDOMEN AND PELVIS WITH CONTRAST TECHNIQUE: Multidetector CT imaging of the abdomen and pelvis was performed using the standard protocol following bolus administration of intravenous contrast. CONTRAST:  <See Chart> OMNIPAQUE IOHEXOL 300 MG/ML  SOLN COMPARISON:  CT abdomen pelvis dated November 14, 2015. FINDINGS: Lower chest: Bibasilar atelectasis and emphysema. Hepatobiliary: No focal liver abnormality. Cholelithiasis. No gallbladder wall thickening or biliary dilatation. Pancreas: Unremarkable. No pancreatic ductal dilatation or surrounding inflammatory changes. Spleen: Interval splenectomy. Adrenals/Urinary Tract: The adrenal glands are unremarkable. New punctate bilateral renal calculi. No hydronephrosis. The bladder is unremarkable. Stomach/Bowel: Stomach is within normal limits. Appendix appears normal. No evidence of bowel wall thickening, distention, or inflammatory changes. Vascular/Lymphatic: New infrarenal abdominal aortic aneurysm measuring up to 3.1 cm. Aortic atherosclerosis. No enlarged abdominal or pelvic lymph nodes. Reproductive: Prostate is unremarkable. Other: New large ventral abdominal hernia containing fat and non-dilated transverse colon. Unchanged small bilateral fat containing inguinal hernias. No free fluid or pneumoperitoneum. Musculoskeletal: No acute or significant osseous findings. IMPRESSION: 1. New large ventral abdominal hernia containing fat and non-dilated transverse colon. 2. Interval splenectomy. 3. New punctate bilateral nephrolithiasis. 4. New 3.1 cm in for abdominal aortic aneurysm. Recommend followup by ultrasound in 3 years. This recommendation follows ACR consensus guidelines: White Paper of the ACR Incidental Findings Committee II on Vascular Findings. J Am Coll Radiol 2013; 10:789-794. Aortic aneurysm NOS (ICD10-I71.9) 5. Cholelithiasis. 6. Aortic Atherosclerosis (ICD10-I70.0). Electronically  Signed   By: Titus Dubin M.D.   On: 08/19/2019 07:33        Scheduled Meds: . enoxaparin (LOVENOX) injection  40 mg Subcutaneous Q24H  . ipratropium-albuterol  3 mL Nebulization QID  . methylPREDNISolone (SOLU-MEDROL) injection  60 mg Intravenous Q12H  . mometasone-formoterol  1 puff Inhalation BID  . senna-docusate  1 tablet Oral BID  . umeclidinium bromide  1 puff Inhalation Daily   Continuous Infusions: . albuterol Stopped (08/19/19 0400)  . ceFEPime (MAXIPIME) IV Stopped (08/19/19 0606)     LOS: 0 days    Time spent: 20min  Domenic Polite, MD [facility]  08/19/2019, 10:45 AM

## 2019-08-19 NOTE — ED Notes (Signed)
Pt O2 sat 88% on 2L [place], increased to 4L [place]. Given PRN breathing tx and Mg started. Will continue to monitor

## 2019-08-19 NOTE — ED Notes (Signed)
MS   bfast ordered  

## 2019-08-19 NOTE — Evaluation (Signed)
Physical Therapy Evaluation Patient Details Name: Marcus [name] MRN: [id number] DOB: Aug 10, 1960 Today's Date: 08/19/2019   History of Present Illness  Pt is a 59 y/o male admitted secondary to COPD exacerbation. PMH includes hernia, COPD, and tobacco use.   Clinical Impression  Pt admitted secondary to problem above with deficits below. Pt tolerated gait well with only mild unsteadiness; required min guard to supervision for safety. Oxygen sats at 90-91% on RA during ambulation. Feel pt will progress well and will likely not require follow up PT at d/c. Will continue to follow acutely to maximize functional mobility independence and safety.     Follow Up Recommendations No PT follow up    Equipment Recommendations  None recommended by PT    Recommendations for Other Services       Precautions / Restrictions Precautions Precautions: Fall Restrictions Weight Bearing Restrictions: No      Mobility  Bed Mobility Overal bed mobility: Modified Independent                Transfers Overall transfer level: Needs assistance Equipment used: None Transfers: Sit to/from Stand Sit to Stand: Supervision         General transfer comment: Supervision for safety.   Ambulation/Gait Ambulation/Gait assistance: Supervision;Min guard Gait Distance (Feet): 175 Feet Assistive device: None Gait Pattern/deviations: Step-through pattern;Decreased stride length Gait velocity: Decreased   General Gait Details: Mild unsteadiness, and slight LOB X2 requiring min guard for safety. Otherwise tolerated ambulation well. Oxygen sats at 90-91% on RA following ambulation.   Stairs            Wheelchair Mobility    Modified Rankin (Stroke Patients Only)       Balance Overall balance assessment: Mild deficits observed, not formally tested                                           Pertinent Vitals/Pain Pain Assessment: 0-10 Pain Score: 10-Worst pain ever Pain  Location: hernia Pain Descriptors / Indicators: Aching Pain Intervention(s): Limited activity within patient's tolerance;Monitored during session;Repositioned    Home Living Family/patient expects to be discharged to:: Private residence Living Arrangements: Parent Available Help at Discharge: Family Type of Home: House Home Access: Stairs to enter Entrance Stairs-Rails: Right;Left;Can reach both Entrance Stairs-Number of Steps: 4 Home Layout: One level Home Equipment: Environmental consultant - 2 wheels;Cane - single point      Prior Function Level of Independence: Independent               Hand Dominance        Extremity/Trunk Assessment   Upper Extremity Assessment Upper Extremity Assessment: Defer to OT evaluation    Lower Extremity Assessment Lower Extremity Assessment: Generalized weakness    Cervical / Trunk Assessment Cervical / Trunk Assessment: Normal  Communication   Communication: Other (comment)(softer, hoarse voice)  Cognition Arousal/Alertness: Awake/alert Behavior During Therapy: WFL for tasks assessed/performed Overall Cognitive Status: Within Functional Limits for tasks assessed                                        General Comments      Exercises     Assessment/Plan    PT Assessment Patient needs continued PT services  PT Problem List Decreased strength;Decreased mobility;Decreased balance;Cardiopulmonary status limiting activity  PT Treatment Interventions Gait training;Stair training;Functional mobility training;Therapeutic activities;Therapeutic exercise;Balance training;Patient/family education    PT Goals (Current goals can be found in the Care Plan section)  Acute Rehab PT Goals Patient Stated Goal: to go home PT Goal Formulation: With patient Time For Goal Achievement: 09/02/19 Potential to Achieve Goals: Good    Frequency Min 3X/week   Barriers to discharge        Co-evaluation PT/OT/SLP  Co-Evaluation/Treatment: Yes Reason for Co-Treatment: To address functional/ADL transfers PT goals addressed during session: Mobility/safety with mobility;Balance         AM-PAC PT "6 Clicks" Mobility  Outcome Measure Help needed turning from your back to your side while in a flat bed without using bedrails?: None Help needed moving from lying on your back to sitting on the side of a flat bed without using bedrails?: None Help needed moving to and from a bed to a chair (including a wheelchair)?: A Little Help needed standing up from a chair using your arms (e.g., wheelchair or bedside chair)?: A Little Help needed to walk in hospital room?: A Little Help needed climbing 3-5 steps with a railing? : A Little 6 Click Score: 20    End of Session   Activity Tolerance: Patient tolerated treatment well Patient left: in bed;with call bell/phone within reach Nurse Communication: Mobility status PT Visit Diagnosis: Other abnormalities of gait and mobility (R26.89);Muscle weakness (generalized) (M62.81)    Time: 1040-1103 PT Time Calculation (min) (ACUTE ONLY): 23 min   Charges:   PT Evaluation $PT Eval Low Complexity: 1 Low          Lou Miner, DPT  Acute Rehabilitation Services  Pager: [phone] Office: [phone]   Rudean Hitt 08/19/2019, 11:50 AM

## 2019-08-20 LAB — BASIC METABOLIC PANEL
Anion gap: 7 (ref 5–15)
BUN: 15 mg/dL (ref 6–20)
CO2: 24 mmol/L (ref 22–32)
Calcium: 8.5 mg/dL — ABNORMAL LOW (ref 8.9–10.3)
Chloride: 106 mmol/L (ref 98–111)
Creatinine, Ser: 0.62 mg/dL (ref 0.61–1.24)
GFR calc Af Amer: >60 mL/min (ref >60)
GFR calc non Af Amer: >60 mL/min (ref >60)
Glucose, Bld: 178 mg/dL — ABNORMAL HIGH (ref 70–99)
Potassium: 5 mmol/L (ref 3.5–5.1)
Sodium: 137 mmol/L (ref 135–145)

## 2019-08-20 LAB — CBC
HCT: 54.2 % — ABNORMAL HIGH (ref 39.0–52.0)
Hemoglobin: 17.1 g/dL — ABNORMAL HIGH (ref 13.0–17.0)
MCH: 27.8 pg (ref 26.0–34.0)
MCHC: 31.5 g/dL (ref 30.0–36.0)
MCV: 88 fL (ref 80.0–100.0)
Platelets: 233 10*3/uL (ref 150–400)
RBC: 6.16 MIL/uL — ABNORMAL HIGH (ref 4.22–5.81)
RDW: 16.6 % — ABNORMAL HIGH (ref 11.5–15.5)
WBC: 1.5 10*3/uL — ABNORMAL LOW (ref 4.0–10.5)
nRBC: 0 % (ref 0.0–0.2)

## 2019-08-20 MED ORDER — PREDNISONE 20 MG PO TABS: 20.0000 mg | ORAL_TABLET | Freq: Every day | ORAL | 0 refills | Status: DC

## 2019-08-20 MED ORDER — MOMETASONE FURO-FORMOTEROL FUM 200-5 MCG/ACT IN AERO: 1.0000 | INHALATION_SPRAY | Freq: Two times a day (BID) | RESPIRATORY_TRACT | 0 refills | Status: DC

## 2019-08-20 MED ORDER — AZITHROMYCIN 250 MG PO TABS: 250.0000 mg | ORAL_TABLET | Freq: Every day | ORAL | 0 refills | Status: AC

## 2019-08-20 MED ORDER — ALBUTEROL SULFATE HFA 108 (90 BASE) MCG/ACT IN AERS
2.0000 | INHALATION_SPRAY | Freq: Four times a day (QID) | RESPIRATORY_TRACT | 0 refills | Status: DC | PRN
Start: 2019-08-20 — End: 2020-01-24

## 2019-08-20 MED ORDER — PHENOL 1.4 % MT LIQD
1.0000 | OROMUCOSAL | Status: DC | PRN
  Administered 2019-08-20: 1 via OROMUCOSAL
  Filled 2019-08-20: qty 177

## 2019-08-20 MED ORDER — ALBUTEROL SULFATE (2.5 MG/3ML) 0.083% IN NEBU
2.5000 mg | INHALATION_SOLUTION | Freq: Four times a day (QID) | RESPIRATORY_TRACT | 1 refills | Status: DC | PRN
Start: 2019-08-20 — End: 2020-01-24

## 2019-08-20 MED ORDER — BENZONATATE 100 MG PO CAPS
200.0000 mg | ORAL_CAPSULE | Freq: Two times a day (BID) | ORAL | Status: DC | PRN
  Administered 2019-08-20: 200 mg via ORAL
  Filled 2019-08-20: qty 2

## 2019-08-20 MED ORDER — GUAIFENESIN-DM 100-10 MG/5ML PO SYRP
5.0000 mL | ORAL_SOLUTION | ORAL | Status: DC | PRN
  Administered 2019-08-20 (×2): 5 mL via ORAL
  Filled 2019-08-20 (×2): qty 5

## 2019-08-20 NOTE — Discharge Summary (Addendum)
Physician Discharge Summary  BLAS FIKES [id number] DOB: October 12, 1960 DOA: 08/18/2019  PCP: Pamella Pert, MD  Admit date: 08/18/2019 Discharge date: 08/20/2019  Time spent: 35 minutes  Recommendations for Outpatient Follow-up:  1. Follow-up with rheumatology and oncology referral Felty syndrome with leukopenia 2. General surgery for elective ventral and right inguinal hernia repair   Discharge Diagnoses:  Principal Problem:   COPD with acute exacerbation ([place]) Tobacco abuse Ventral hernia Right inguinal hernia   Leukopenia   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HCC)   Seropositive rheumatoid arthritis ([place]) Abdominal aortic aneurysm Polycythemia   Discharge Condition: Stable  Diet recommendation: Heart healthy  Filed Weights   08/18/19 1721  Weight: 83.9 kg    History of present illness:  Marcus Jarvis Lynchis a 59 y.o.malewith medical history significant ofCOPD, RA with h/o Felty syndrome s/p splenectomy, chronic leukopenia, chronic ventral and right inguinal hernia. Not on any treatment currently for RA, not on any chronic nebs for COPD. Hasnt seen doctor in many months Pt present to ED with c/o SOB, wheezing, productive cough. ED Course:WBC 1.8k (chronic and baseline since 2017). Given nebs, steroids in ED.   Hospital Course:   Acute hypoxic respiratory failure  COPD with acute exacerbation (HCC) -Clinically improving ,  -Chest x-ray notable for COPD -Treated with IV steroids, duo nebs, azithromycin, weaned off oxygen today  -Clinically improving, transition to prednisone taper at discharge  -Smoking cessation counseled, would benefit from pulmonary follow-up   Tobacco abuse -Counseled, wearing a nicotine patch at this time  History of rheumatoid arthritis, leukopenia, Felty syndrome -Underwent splenectomy in 2017 -Discussed the patient's on importance of rheumatology and oncology follow-up  Ventral and right inguinal hernia -Patient reports having had these for a  number of years -Nonobstructed, reducible -Tolerating diet, having bowel movements -Patient saw a general surgeon at [facility] 5 to 6 months ago, was supposed to have surgery however things got delayed with Covid -Emphasized importance of follow-up with the surgeon for elective hernia repair  Abdominal aortic aneurysm -3.1 cm on CT, needs follow-up for this  Polycythemia -Likely secondary to COPD and/or splenectomy  Discharge Exam: Vitals:   08/19/19 2145 08/20/19 0830  BP: (!) 141/73 (!) 144/87  Pulse: 93 77  Resp: 18 18  Temp: 97.8 F (36.6 C) 98.1 F (36.7 C)  SpO2: 92% 94%    General: AAOx3 Cardiovascular:S1S2/RRR Respiratory: Improved air movement, no wheezing today Abdomen: Circular ventral hernia and right inguinal hernia both soft and reducible  Discharge Instructions   Discharge Instructions    Diet - low sodium heart healthy   Complete by: As directed    Increase activity slowly   Complete by: As directed      Allergies as of 08/20/2019      Reactions   Levaquin [levofloxacin In D5w] Other (See Comments)   Chest pain   Methotrexate Other (See Comments)   Chest pain      Medication List    TAKE these medications   albuterol 108 (90 Base) MCG/ACT inhaler Commonly known as: VENTOLIN HFA Inhale 2 puffs into the lungs every 6 (six) hours as needed for wheezing or shortness of breath.   albuterol (2.5 MG/3ML) 0.083% nebulizer solution Commonly known as: PROVENTIL Take 3 mLs (2.5 mg total) by nebulization every 6 (six) hours as needed for wheezing or shortness of breath.   azithromycin 250 MG tablet Commonly known as: ZITHROMAX Take 1 tablet (250 mg total) by mouth daily for 3 days.   mometasone-formoterol 200-5 MCG/ACT Aero  Commonly known as: DULERA Inhale 1 puff into the lungs 2 (two) times daily.   predniSONE 20 MG tablet Commonly known as: DELTASONE Take 1-2 tablets (20-40 mg total) by mouth daily with breakfast. Take 40mg  daily for 2days  then 20mg  daily for 3days and then STOP What changed:   medication strength  how much to take  how to take this  when to take this  additional instructions      Allergies  Allergen Reactions  . Levaquin [Levofloxacin In D5w] Other (See Comments)    Chest pain  . Methotrexate Other (See Comments)    Chest pain   Follow-up Information    Medlin, Stephani Police, MD. Schedule an appointment as soon as possible for a visit in 1 week(s).   Specialty: Internal Medicine Contact information: [address] [place] [address] [phone]        General Surgeon. Schedule an appointment as soon as possible for a visit in 1 month(s).   Why: for Elective Hernia repair           The results of significant diagnostics from this hospitalization (including imaging, microbiology, ancillary and laboratory) are listed below for reference.    Significant Diagnostic Studies: DG Chest 2 View  Result Date: 08/18/2019 CLINICAL DATA:  Shortness of breath EXAM: CHEST - 2 VIEW COMPARISON:  03/03/2017 FINDINGS: There is hyperinflation of the lungs compatible with COPD. Heart is normal size. No confluent opacities or effusions. No acute bony abnormality. IMPRESSION: COPD.  No active disease. Electronically Signed   By: Rolm Baptise M.D.   On: 08/18/2019 18:21   CT ABDOMEN PELVIS W CONTRAST  Result Date: 08/19/2019 CLINICAL DATA:  Respiratory distress. Large ventral hernia. EXAM: CT ABDOMEN AND PELVIS WITH CONTRAST TECHNIQUE: Multidetector CT imaging of the abdomen and pelvis was performed using the standard protocol following bolus administration of intravenous contrast. CONTRAST:  <See Chart> OMNIPAQUE IOHEXOL 300 MG/ML  SOLN COMPARISON:  CT abdomen pelvis dated November 14, 2015. FINDINGS: Lower chest: Bibasilar atelectasis and emphysema. Hepatobiliary: No focal liver abnormality. Cholelithiasis. No gallbladder wall thickening or biliary dilatation. Pancreas: Unremarkable. No pancreatic ductal  dilatation or surrounding inflammatory changes. Spleen: Interval splenectomy. Adrenals/Urinary Tract: The adrenal glands are unremarkable. New punctate bilateral renal calculi. No hydronephrosis. The bladder is unremarkable. Stomach/Bowel: Stomach is within normal limits. Appendix appears normal. No evidence of bowel wall thickening, distention, or inflammatory changes. Vascular/Lymphatic: New infrarenal abdominal aortic aneurysm measuring up to 3.1 cm. Aortic atherosclerosis. No enlarged abdominal or pelvic lymph nodes. Reproductive: Prostate is unremarkable. Other: New large ventral abdominal hernia containing fat and non-dilated transverse colon. Unchanged small bilateral fat containing inguinal hernias. No free fluid or pneumoperitoneum. Musculoskeletal: No acute or significant osseous findings. IMPRESSION: 1. New large ventral abdominal hernia containing fat and non-dilated transverse colon. 2. Interval splenectomy. 3. New punctate bilateral nephrolithiasis. 4. New 3.1 cm in for abdominal aortic aneurysm. Recommend followup by ultrasound in 3 years. This recommendation follows ACR consensus guidelines: White Paper of the ACR Incidental Findings Committee II on Vascular Findings. J Am Coll Radiol 2013; 10:789-794. Aortic aneurysm NOS (ICD10-I71.9) 5. Cholelithiasis. 6. Aortic Atherosclerosis (ICD10-I70.0). Electronically Signed   By: Titus Dubin M.D.   On: 08/19/2019 07:33    Microbiology: Recent Results (from the past 240 hour(s))  SARS Coronavirus 2 by RT PCR (hospital order, performed in [facility] hospital lab) Nasopharyngeal Nasopharyngeal Swab     Status: None   Collection Time: 08/18/19  9:54 PM   Specimen: Nasopharyngeal Swab  Result Value Ref Range Status   SARS Coronavirus 2 NEGATIVE NEGATIVE Final    Comment: (NOTE) SARS-CoV-2 target nucleic acids are NOT DETECTED. The SARS-CoV-2 RNA is generally detectable in upper and lower respiratory specimens during the acute phase of infection.  The lowest concentration of SARS-CoV-2 viral copies this assay can detect is 250 copies / mL. A negative result does not preclude SARS-CoV-2 infection and should not be used as the sole basis for treatment or other patient management decisions.  A negative result may occur with improper specimen collection / handling, submission of specimen other than nasopharyngeal swab, presence of viral mutation(s) within the areas targeted by this assay, and inadequate number of viral copies (<250 copies / mL). A negative result must be combined with clinical observations, patient history, and epidemiological information. Fact Sheet for Patients:   StrictlyIdeas.no Fact Sheet for Healthcare Providers: BankingDealers.co.za This test is not yet approved or cleared  by the [place] FDA and has been authorized for detection and/or diagnosis of SARS-CoV-2 by FDA under an Emergency Use Authorization (EUA).  This EUA will remain in effect (meaning this test can be used) for the duration of the COVID-19 declaration under Section 564(b)(1) of the Act, 21 U.S.C. section 360bbb-3(b)(1), unless the authorization is terminated or revoked sooner. Performed at [facility], [place] [address], [place], [place] [address]      Labs: Basic Metabolic Panel: Recent Labs  Lab 08/18/19 1800 08/19/19 0439 08/20/19 0254  NA 138 137 137  K 3.9 3.5 5.0  CL 102 101 106  CO2 25 22 24   GLUCOSE 108* 259* 178*  BUN 13 12 15   CREATININE 0.75 0.87 0.62  CALCIUM 8.6* 8.6* 8.5*   Liver Function Tests: Recent Labs  Lab 08/18/19 1800  AST 24  ALT 17  ALKPHOS 95  BILITOT 0.8  PROT 7.7  ALBUMIN 2.8*   No results for input(s): LIPASE, AMYLASE in the last 168 hours. No results for input(s): AMMONIA in the last 168 hours. CBC: Recent Labs  Lab 08/18/19 1800 08/19/19 0439 08/20/19 0254  WBC 1.8* 0.8* 1.5*  NEUTROABS 0.2*  --   --   HGB 18.0* 17.7* 17.1*   HCT 58.1* 57.5* 54.2*  MCV 90.4 89.7 88.0  PLT 176 205 233   Cardiac Enzymes: No results for input(s): CKTOTAL, CKMB, CKMBINDEX, TROPONINI in the last 168 hours. BNP: BNP (last 3 results) No results for input(s): BNP in the last 8760 hours.  ProBNP (last 3 results) No results for input(s): PROBNP in the last 8760 hours.  CBG: No results for input(s): GLUCAP in the last 168 hours.     Signed:  Domenic Polite MD.  [facility] 08/20/2019, 1:31 PM

## 2019-11-30 ENCOUNTER — Emergency Department: Payer: Medicaid Other

## 2019-11-30 ENCOUNTER — Other Ambulatory Visit: Payer: Self-pay

## 2019-11-30 DIAGNOSIS — F17210 Nicotine dependence, cigarettes, uncomplicated: Secondary | ICD-10-CM | POA: Diagnosis not present

## 2019-11-30 DIAGNOSIS — Z20822 Contact with and (suspected) exposure to covid-19: Secondary | ICD-10-CM | POA: Diagnosis not present

## 2019-11-30 DIAGNOSIS — R21 Rash and other nonspecific skin eruption: Secondary | ICD-10-CM | POA: Insufficient documentation

## 2019-11-30 DIAGNOSIS — J441 Chronic obstructive pulmonary disease with (acute) exacerbation: Secondary | ICD-10-CM | POA: Diagnosis not present

## 2019-11-30 DIAGNOSIS — R0602 Shortness of breath: Secondary | ICD-10-CM | POA: Diagnosis present

## 2019-11-30 DIAGNOSIS — Z79899 Other long term (current) drug therapy: Secondary | ICD-10-CM | POA: Diagnosis not present

## 2019-11-30 LAB — COMPREHENSIVE METABOLIC PANEL
ALT: 13 U/L (ref 0–44)
AST: 25 U/L (ref 15–41)
Albumin: 3.3 g/dL — ABNORMAL LOW (ref 3.5–5.0)
Alkaline Phosphatase: 113 U/L (ref 38–126)
Anion gap: 9 (ref 5–15)
BUN: 10 mg/dL (ref 6–20)
CO2: 27 mmol/L (ref 22–32)
Calcium: 8.6 mg/dL — ABNORMAL LOW (ref 8.9–10.3)
Chloride: 101 mmol/L (ref 98–111)
Creatinine, Ser: 0.59 mg/dL — ABNORMAL LOW (ref 0.61–1.24)
GFR calc Af Amer: >60 mL/min (ref >60)
GFR calc non Af Amer: >60 mL/min (ref >60)
Glucose, Bld: 85 mg/dL (ref 70–99)
Potassium: 3.8 mmol/L (ref 3.5–5.1)
Sodium: 137 mmol/L (ref 135–145)
Total Bilirubin: 0.9 mg/dL (ref 0.3–1.2)
Total Protein: 7.8 g/dL (ref 6.5–8.1)

## 2019-11-30 LAB — GLUCOSE, CAPILLARY: Glucose-Capillary: 105 mg/dL — ABNORMAL HIGH (ref 70–99)

## 2019-11-30 LAB — CBC
HCT: 56 % — ABNORMAL HIGH (ref 39.0–52.0)
Hemoglobin: 17.9 g/dL — ABNORMAL HIGH (ref 13.0–17.0)
MCH: 28.5 pg (ref 26.0–34.0)
MCHC: 32 g/dL (ref 30.0–36.0)
MCV: 89.3 fL (ref 80.0–100.0)
Platelets: 191 10*3/uL (ref 150–400)
RBC: 6.27 MIL/uL — ABNORMAL HIGH (ref 4.22–5.81)
RDW: 17.2 % — ABNORMAL HIGH (ref 11.5–15.5)
WBC: 2.1 10*3/uL — ABNORMAL LOW (ref 4.0–10.5)
nRBC: 0 % (ref 0.0–0.2)

## 2019-11-30 LAB — SARS CORONAVIRUS 2 BY RT PCR (HOSPITAL ORDER, PERFORMED IN ~~LOC~~ HOSPITAL LAB): SARS Coronavirus 2: NEGATIVE

## 2019-11-30 LAB — LIPASE, BLOOD: Lipase: 52 U/L — ABNORMAL HIGH (ref 11–51)

## 2019-11-30 LAB — TROPONIN I (HIGH SENSITIVITY): Troponin I (High Sensitivity): 7 ng/L (ref <18)

## 2019-11-30 IMAGING — CR DG CHEST 2V
2 series · 2 of 2 positions shown · non-contrast
Comparison: [DATE]

CLINICAL DATA: Shortness of breath

EXAM:
CHEST - 2 VIEW

[chest pa]
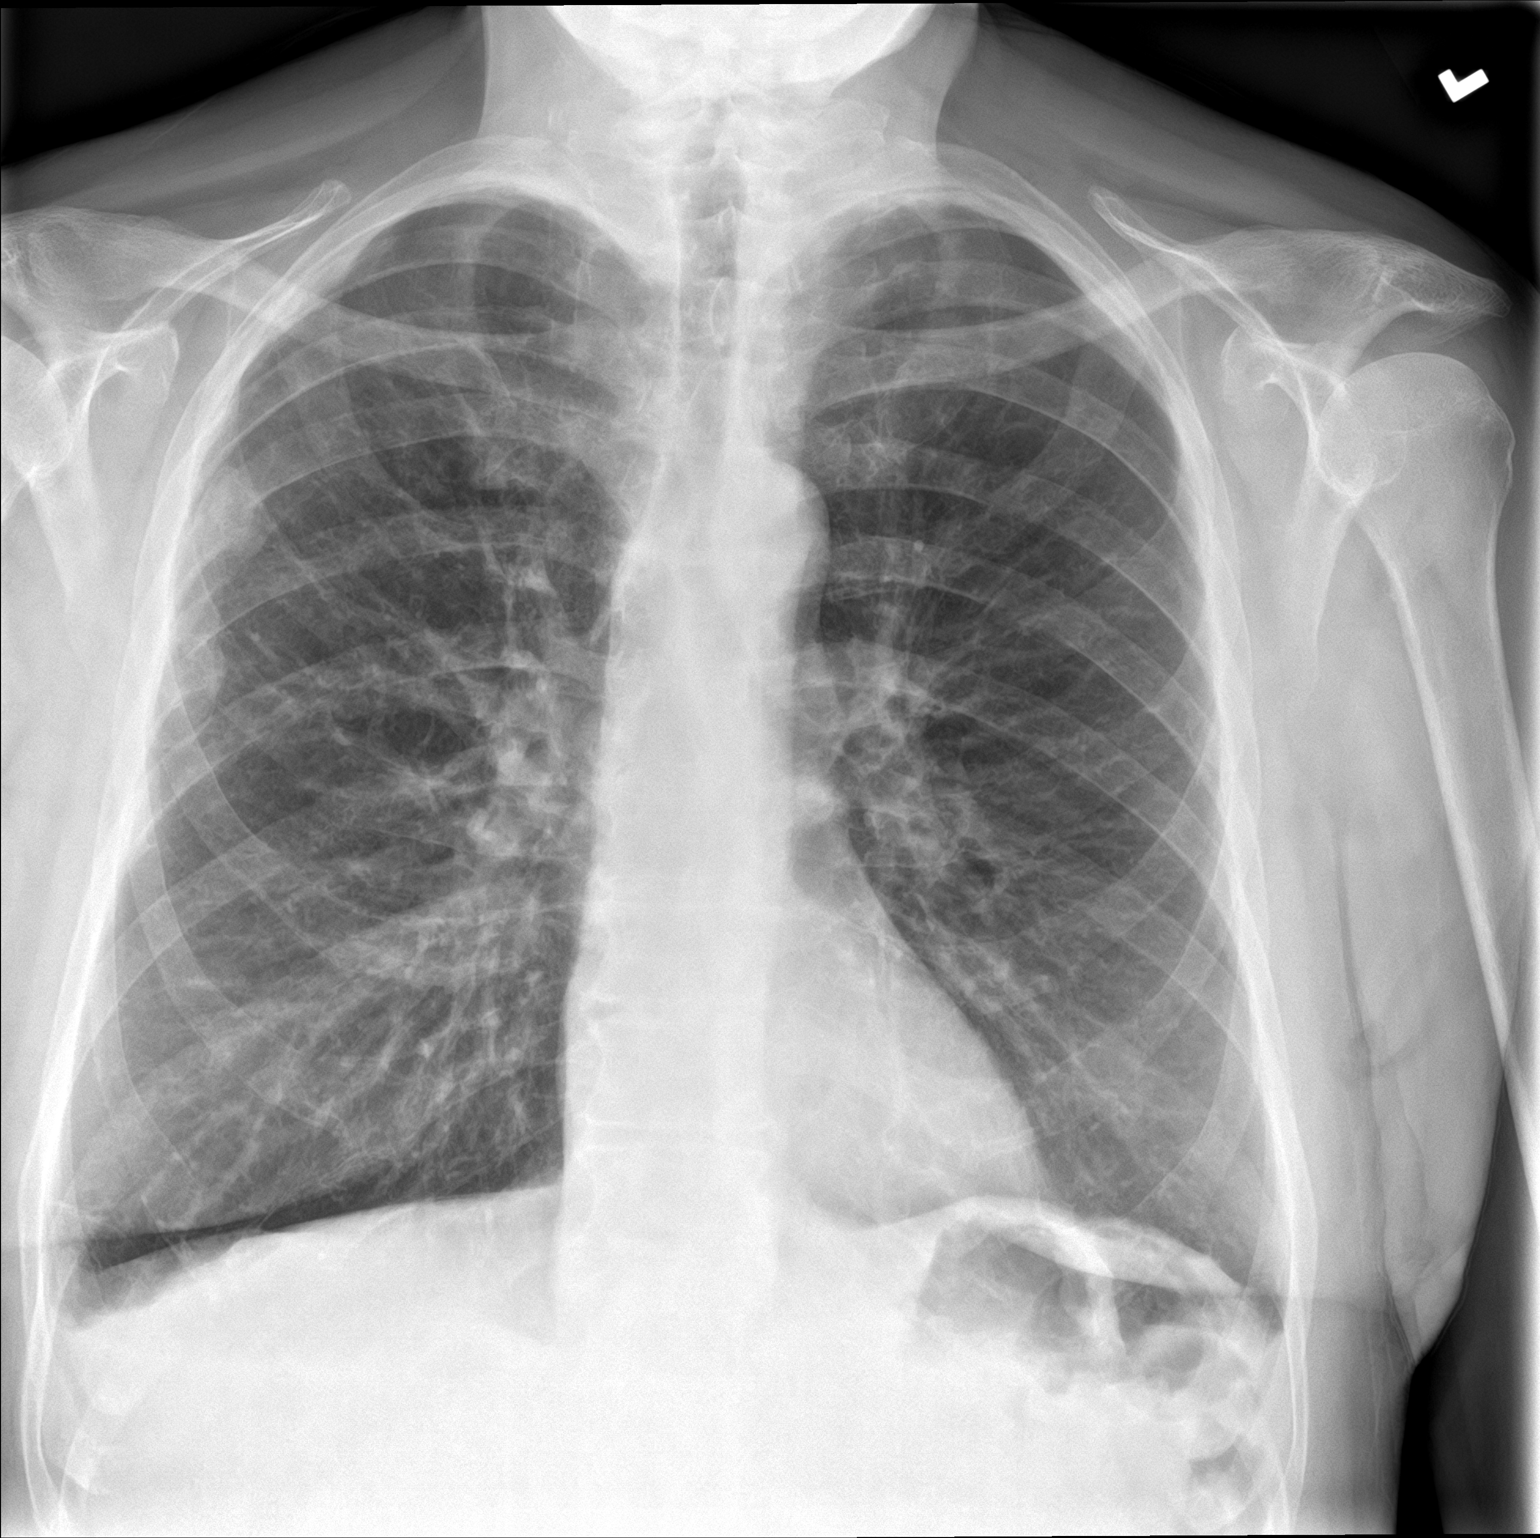

[chest lat]
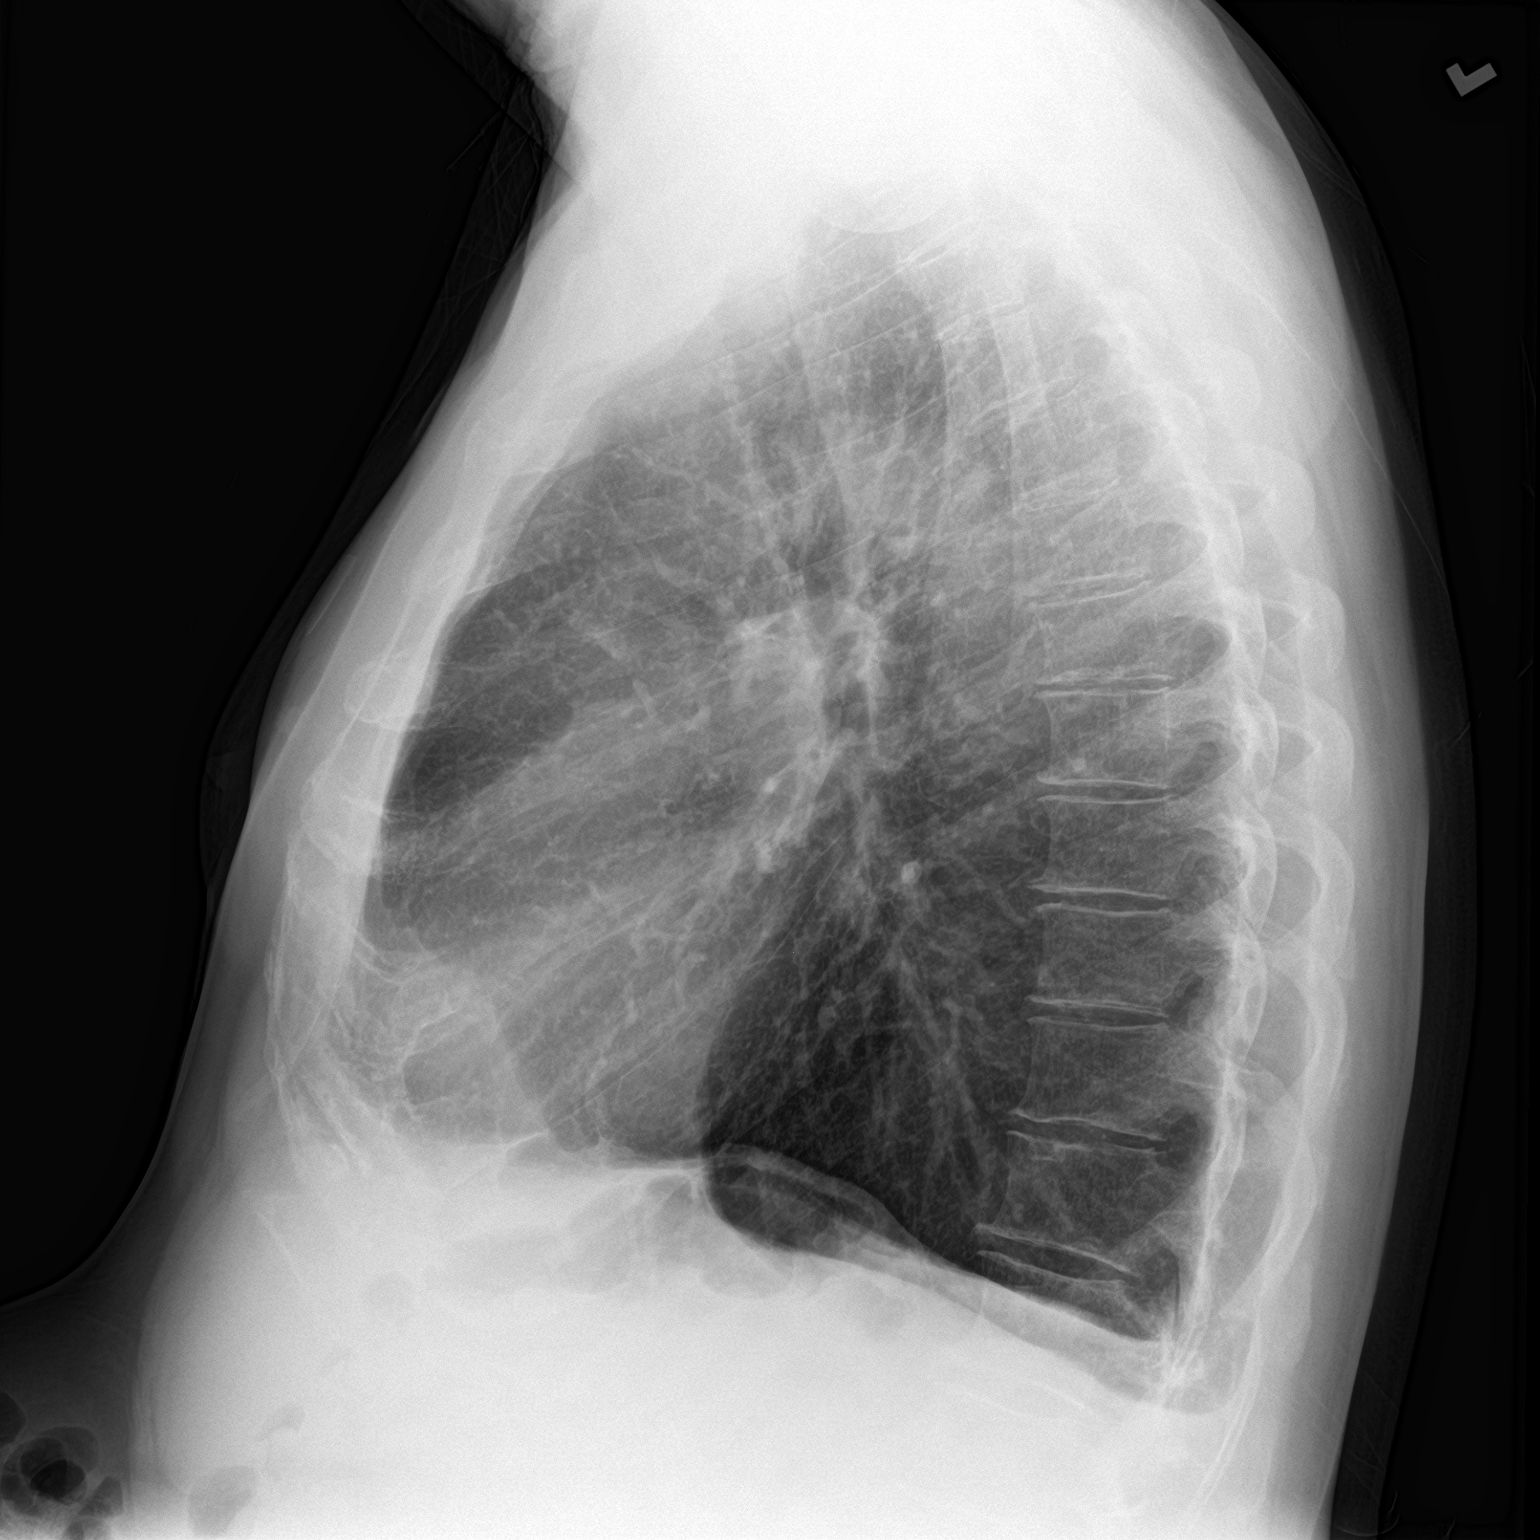

[2 of 2 positions shown; findings below may reference images not displayed]

FINDINGS: There is hyperinflation of the lungs compatible with COPD. Heart is
normal size. No confluent opacities or effusions. Old right rib
fractures again noted, unchanged.
IMPRESSION: COPD.  No active disease.

## 2019-11-30 NOTE — ED Notes (Signed)
Called several times from lobby with no answer

## 2019-11-30 NOTE — ED Triage Notes (Signed)
Pt in with co abd hernia for 4 years, large hernia noted to abd. States not any larger then normal, states has also noted a rash all over for 3 weeks. Pt also co shob for 2 weeks. Pt denies any fever, no n.v.d. Pt has hx of copd.

## 2019-12-01 ENCOUNTER — Emergency Department
Admission: EM | Admit: 2019-12-01 | Discharge: 2019-12-01 | Disposition: A | Discharge status: Home / Self Care | Payer: Medicaid Other | Attending: Emergency Medicine | Admitting: Emergency Medicine

## 2019-12-01 DIAGNOSIS — R21 Rash and other nonspecific skin eruption: Secondary | ICD-10-CM

## 2019-12-01 DIAGNOSIS — J441 Chronic obstructive pulmonary disease with (acute) exacerbation: Secondary | ICD-10-CM

## 2019-12-01 MED ORDER — PREDNISONE 20 MG PO TABS
60.0000 mg | ORAL_TABLET | Freq: Once | ORAL | Status: AC
  Administered 2019-12-01: 60 mg via ORAL
  Filled 2019-12-01: qty 3

## 2019-12-01 MED ORDER — AZITHROMYCIN 500 MG PO TABS
500.0000 mg | ORAL_TABLET | Freq: Once | ORAL | Status: AC
  Administered 2019-12-01: 500 mg via ORAL
  Filled 2019-12-01: qty 1

## 2019-12-01 MED ORDER — PREDNISONE 10 MG PO TABS: ORAL_TABLET | ORAL | 0 refills | Status: AC

## 2019-12-01 MED ORDER — AZITHROMYCIN 250 MG PO TABS: ORAL_TABLET | ORAL | 0 refills | Status: DC

## 2019-12-01 MED ORDER — GUAIFENESIN ER 600 MG PO TB12: 600.0000 mg | ORAL_TABLET | Freq: Two times a day (BID) | ORAL | 0 refills | Status: AC

## 2019-12-01 NOTE — ED Provider Notes (Signed)
[facility] Emergency Department Provider Note  ____________________________________________  Time seen: Approximately 6:07 AM  I have reviewed the triage vital signs and the nursing notes.   HISTORY  Chief Complaint Hernia and Shortness of Breath    HPI Marcus [name] is a 59 y.o. male with a history of COPD, leukopenia, rheumatoid arthritis and splenectomy who comes to the ED complaining of rash for the past 3 weeks. Predominantly on his trunk. Mildly itchy. Not painful. Not sure if he has bedbugs. No new medications or environmental exposures. No fevers or chills. Symptoms are constant without aggravating or alleviating factors  Also reports chronic shortness of breath which feels a baseline.   Patient notes that he has been on prednisone for the past several years, but was discontinued 2 months ago   Past Medical History:  Diagnosis Date  . COPD (chronic obstructive pulmonary disease) ([place])   . Felty syndrome ([place])   . Hernia, epigastric   . Pancytopenia ([place])   . Seropositive rheumatoid arthritis ([place])   . Tobacco use disorder      Patient Active Problem List   Diagnosis Date Noted  . COPD with acute exacerbation ([place]) 08/18/2019  . 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 Anemia 11/14/2015  . Seropositive rheumatoid arthritis ([place])   . Chronic obstructive pulmonary disease ([place])   . Protein-calorie malnutrition, severe 07/24/2015  . Pancytopenia ([place]) 07/23/2015  . 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[place]) 06/06/2013  . Leukopenia 10/21/2012  . 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 Abnormal EKG 10/21/2012  . Joint pain 10/21/2012  . Splenomegaly 10/21/2012     No past surgical history on file.   Prior to Admission medications   Medication Sig Start Date End Date Taking? Authorizing Provider  albuterol (PROVENTIL) (2.5 MG/3ML) 0.083% nebulizer solution Take 3 mLs (2.5 mg total) by nebulization every 6 (six) hours as needed for wheezing or shortness of breath. 08/20/19    Domenic Polite, MD  albuterol (VENTOLIN HFA) 108 (90 Base) MCG/ACT inhaler Inhale 2 puffs into the lungs every 6 (six) hours as needed for wheezing or shortness of breath. 08/20/19   Domenic Polite, MD  azithromycin (ZITHROMAX Z-PAK) 250 MG tablet Take 2 tablets (500 mg) on  Day 1,  followed by 1 tablet (250 mg) once daily on Days 2 through 5. 12/01/19   Carrie Mew, MD  guaiFENesin (MUCINEX) 600 MG 12 hr tablet Take 1 tablet (600 mg total) by mouth 2 (two) times daily for 15 days. 12/01/19 12/16/19  Carrie Mew, MD  mometasone-formoterol [facility]) 200-5 MCG/ACT AERO Inhale 1 puff into the lungs 2 (two) times daily. 08/20/19   Domenic Polite, MD  predniSONE (DELTASONE) 10 MG tablet Take 5 tablets (50 mg total) by mouth daily for 3 days, THEN 4 tablets (40 mg total) daily for 3 days, THEN 3 tablets (30 mg total) daily for 3 days, THEN 2 tablets (20 mg total) daily for 3 days, THEN 1 tablet (10 mg total) daily for 3 days. 12/01/19 12/16/19  Carrie Mew, MD     Allergies Levaquin [levofloxacin in d5w] and Methotrexate   Family History  Problem Relation Age of Onset  . CAD Brother   . COPD Brother     Social History Social History   Tobacco Use  . Smoking status: Current Every Day Smoker    Packs/day: 1.00    Types: Cigarettes  . Smokeless tobacco: Never Used  Substance Use Topics  . Alcohol use: No    Alcohol/week: 0.0 standard drinks  . Drug  use: Yes    Types: Marijuana    Review of Systems  Constitutional:   No fever or chills.  ENT:   No sore throat. No rhinorrhea. Cardiovascular:   No chest pain or syncope. Respiratory: Shortness of breath and nonproductive cough. Gastrointestinal:   Negative for abdominal pain, vomiting and diarrhea. Chronic ventral hernia Musculoskeletal:   Negative for focal pain or swelling All other systems reviewed and are negative except as documented above in ROS and HPI.  ____________________________________________   PHYSICAL  EXAM:  VITAL SIGNS: ED Triage Vitals [11/30/19 1934]  Enc Vitals Group     BP 121/84     Pulse Rate 83     Resp (!) 22     Temp 98.8 F (37.1 C)     Temp Source Oral     SpO2 92 %     Weight 175 lb (79.4 kg)     Height 5\' 10"  (1.778 m)     Head Circumference      Peak Flow      Pain Score 10     Pain Loc      Pain Edu?      Excl. in [place]?     Vital signs reviewed, nursing assessments reviewed.   Constitutional:   Alert and oriented. Non-toxic appearance. Eyes:   Conjunctivae are normal. EOMI. PERRL. ENT      Head:   Normocephalic and atraumatic.      Nose:   Normal      Mouth/Throat: Dry mucous membranes      Neck:   No meningismus. Full ROM. Hematological/Lymphatic/Immunilogical:   No cervical lymphadenopathy. Cardiovascular:   RRR. Symmetric bilateral radial and DP pulses.  No murmurs. Cap refill less than 2 seconds. Respiratory:   Normal respiratory effort without tachypnea/retractions. Symmetric air entry, no crackles. There is expiratory wheezing. Gastrointestinal:   Soft and nontender. Non distended. There is no CVA tenderness.  No rebound, rigidity, or guarding. Large ventral hernia without incarceration. Small reducible right inguinal hernia.  Musculoskeletal:   Normal range of motion in all extremities. No joint effusions.  No lower extremity tenderness.  No edema. Neurologic:   Normal speech and language.  Motor grossly intact. No acute focal neurologic deficits are appreciated.  Skin:    Skin is warm, dry and intact. There is a scant nodular blanching rash over the trunk. No petechia or purpura or bullae.. No ecchymosis.  ____________________________________________    LABS (pertinent positives/negatives) (all labs ordered are listed, but only abnormal results are displayed) Labs Reviewed  CBC - Abnormal; Notable for the following components:      Result Value   WBC 2.1 (*)    RBC 6.27 (*)    Hemoglobin 17.9 (*)    HCT 56.0 (*)    RDW 17.2 (*)    All  other components within normal limits  COMPREHENSIVE METABOLIC PANEL - Abnormal; Notable for the following components:   Creatinine, Ser 0.59 (*)    Calcium 8.6 (*)    Albumin 3.3 (*)    All other components within normal limits  GLUCOSE, CAPILLARY - Abnormal; Notable for the following components:   Glucose-Capillary 105 (*)    All other components within normal limits  LIPASE, BLOOD - Abnormal; Notable for the following components:   Lipase 52 (*)    All other components within normal limits  SARS CORONAVIRUS 2 BY RT PCR (HOSPITAL ORDER, [facility])  TROPONIN I (HIGH SENSITIVITY)   ____________________________________________  EKG  Interpreted by me Normal sinus rhythm rate of 79, left axis, normal intervals. Right bundle branch block. No acute ischemic changes.  ____________________________________________    RADIOLOGY  DG Chest 2 View  Result Date: 11/30/2019 CLINICAL DATA:  Shortness of breath EXAM: CHEST - 2 VIEW COMPARISON:  08/18/2019 FINDINGS: There is hyperinflation of the lungs compatible with COPD. Heart is normal size. No confluent opacities or effusions. Old right rib fractures again noted, unchanged. IMPRESSION: COPD.  No active disease. Electronically Signed   By: Rolm Baptise M.D.   On: 11/30/2019 20:39    ____________________________________________   PROCEDURES Procedures  ____________________________________________    CLINICAL IMPRESSION / ASSESSMENT AND PLAN / ED COURSE  Medications ordered in the ED: Medications  predniSONE (DELTASONE) tablet 60 mg (has no administration in time range)  azithromycin (ZITHROMAX) tablet 500 mg (has no administration in time range)    Pertinent labs & imaging results that were available during my care of the patient were reviewed by me and considered in my medical decision making (see chart for details).  Marcus [name] was evaluated in Emergency Department on 12/01/2019 for the symptoms  described in the history of present illness. He was evaluated in the context of the global COVID-19 pandemic, which necessitated consideration that the patient might be at risk for infection with the SARS-CoV-2 virus that causes COVID-19. Institutional protocols and algorithms that pertain to the evaluation of patients at risk for COVID-19 are in a state of rapid change based on information released by regulatory bodies including the CDC and federal and state organizations. These policies and algorithms were followed during the patient's care in the ED.   Patient comes the ED complaining of rash as his acute symptoms. This has been going on for 3 weeks. Vital signs unremarkable, nontoxic. Labs are normal. Doubt infection. Appears consistent with insect bites.  Patient is maintaining oxygen saturation of 94% on room air. With his wheeziness, I think he would benefit from reintroduce steroid taper, azithromycin course, Mucinex. Recommend close follow-up with primary care.  Doubt ACS PE dissection AAA pneumothorax pericarditis or bacterial pneumonia. Patient is nontoxic.      ____________________________________________   FINAL CLINICAL IMPRESSION(S) / ED DIAGNOSES    Final diagnoses:  COPD exacerbation ([place])  Rash of body     ED Discharge Orders         Ordered    predniSONE (DELTASONE) 10 MG tablet        12/01/19 0606    azithromycin (ZITHROMAX Z-PAK) 250 MG tablet        12/01/19 0606    guaiFENesin (MUCINEX) 600 MG 12 hr tablet  2 times daily        12/01/19 0606          Portions of this note were generated with dragon dictation software. Dictation errors may occur despite best attempts at proofreading.   Carrie Mew, MD 12/01/19 [phone]

## 2020-01-17 ENCOUNTER — Inpatient Hospital Stay: Admit: 2020-01-17 | Payer: Medicaid Other | Admitting: General Surgery

## 2020-01-17 ENCOUNTER — Emergency Department (HOSPITAL_COMMUNITY): Payer: Medicaid Other | Admitting: Certified Registered Nurse Anesthetist

## 2020-01-17 ENCOUNTER — Emergency Department (HOSPITAL_COMMUNITY): Payer: Medicaid Other

## 2020-01-17 ENCOUNTER — Inpatient Hospital Stay (HOSPITAL_COMMUNITY)
Admission: EM | Admit: 2020-01-17 | Discharge: 2020-01-24 | DRG: 853 | Disposition: A | Discharge status: Home Health | Payer: Medicaid Other | Attending: General Surgery | Admitting: General Surgery

## 2020-01-17 ENCOUNTER — Encounter (HOSPITAL_COMMUNITY): Admission: EM | Disposition: A | Discharge status: Home Health | Payer: Self-pay | Source: Home / Self Care

## 2020-01-17 ENCOUNTER — Other Ambulatory Visit: Payer: Self-pay

## 2020-01-17 ENCOUNTER — Encounter (HOSPITAL_COMMUNITY): Payer: Self-pay

## 2020-01-17 DIAGNOSIS — K432 Incisional hernia without obstruction or gangrene: Secondary | ICD-10-CM | POA: Diagnosis present

## 2020-01-17 DIAGNOSIS — E8770 Fluid overload, unspecified: Secondary | ICD-10-CM | POA: Diagnosis not present

## 2020-01-17 DIAGNOSIS — I4510 Unspecified right bundle-branch block: Secondary | ICD-10-CM | POA: Diagnosis present

## 2020-01-17 DIAGNOSIS — A419 Sepsis, unspecified organism: Secondary | ICD-10-CM | POA: Diagnosis present

## 2020-01-17 DIAGNOSIS — R198 Other specified symptoms and signs involving the digestive system and abdomen: Secondary | ICD-10-CM

## 2020-01-17 DIAGNOSIS — Z9081 Acquired absence of spleen: Secondary | ICD-10-CM

## 2020-01-17 DIAGNOSIS — M0500 Felty's syndrome, unspecified site: Secondary | ICD-10-CM | POA: Diagnosis present

## 2020-01-17 DIAGNOSIS — J811 Chronic pulmonary edema: Secondary | ICD-10-CM

## 2020-01-17 DIAGNOSIS — J9691 Respiratory failure, unspecified with hypoxia: Secondary | ICD-10-CM | POA: Diagnosis not present

## 2020-01-17 DIAGNOSIS — K251 Acute gastric ulcer with perforation: Secondary | ICD-10-CM | POA: Diagnosis not present

## 2020-01-17 DIAGNOSIS — K255 Chronic or unspecified gastric ulcer with perforation: Secondary | ICD-10-CM | POA: Diagnosis present

## 2020-01-17 DIAGNOSIS — Z881 Allergy status to other antibiotic agents status: Secondary | ICD-10-CM

## 2020-01-17 DIAGNOSIS — E871 Hypo-osmolality and hyponatremia: Secondary | ICD-10-CM | POA: Diagnosis not present

## 2020-01-17 DIAGNOSIS — Z8249 Family history of ischemic heart disease and other diseases of the circulatory system: Secondary | ICD-10-CM

## 2020-01-17 DIAGNOSIS — J441 Chronic obstructive pulmonary disease with (acute) exacerbation: Secondary | ICD-10-CM | POA: Diagnosis present

## 2020-01-17 DIAGNOSIS — Z7952 Long term (current) use of systemic steroids: Secondary | ICD-10-CM | POA: Diagnosis not present

## 2020-01-17 DIAGNOSIS — K668 Other specified disorders of peritoneum: Secondary | ICD-10-CM | POA: Diagnosis not present

## 2020-01-17 DIAGNOSIS — I2781 Cor pulmonale (chronic): Secondary | ICD-10-CM | POA: Diagnosis present

## 2020-01-17 DIAGNOSIS — J9601 Acute respiratory failure with hypoxia: Secondary | ICD-10-CM | POA: Diagnosis present

## 2020-01-17 DIAGNOSIS — K567 Ileus, unspecified: Secondary | ICD-10-CM | POA: Diagnosis not present

## 2020-01-17 DIAGNOSIS — Z20822 Contact with and (suspected) exposure to covid-19: Secondary | ICD-10-CM | POA: Diagnosis present

## 2020-01-17 DIAGNOSIS — Z825 Family history of asthma and other chronic lower respiratory diseases: Secondary | ICD-10-CM

## 2020-01-17 DIAGNOSIS — E876 Hypokalemia: Secondary | ICD-10-CM | POA: Diagnosis not present

## 2020-01-17 DIAGNOSIS — M059 Rheumatoid arthritis with rheumatoid factor, unspecified: Secondary | ICD-10-CM | POA: Diagnosis present

## 2020-01-17 DIAGNOSIS — R0602 Shortness of breath: Secondary | ICD-10-CM | POA: Diagnosis present

## 2020-01-17 DIAGNOSIS — J440 Chronic obstructive pulmonary disease with acute lower respiratory infection: Secondary | ICD-10-CM | POA: Diagnosis not present

## 2020-01-17 DIAGNOSIS — Z4659 Encounter for fitting and adjustment of other gastrointestinal appliance and device: Secondary | ICD-10-CM

## 2020-01-17 DIAGNOSIS — R0902 Hypoxemia: Secondary | ICD-10-CM

## 2020-01-17 DIAGNOSIS — F17210 Nicotine dependence, cigarettes, uncomplicated: Secondary | ICD-10-CM | POA: Diagnosis present

## 2020-01-17 DIAGNOSIS — R931 Abnormal findings on diagnostic imaging of heart and coronary circulation: Secondary | ICD-10-CM | POA: Diagnosis not present

## 2020-01-17 DIAGNOSIS — R9431 Abnormal electrocardiogram [ECG] [EKG]: Secondary | ICD-10-CM | POA: Diagnosis not present

## 2020-01-17 DIAGNOSIS — Z888 Allergy status to other drugs, medicaments and biological substances status: Secondary | ICD-10-CM | POA: Diagnosis not present

## 2020-01-17 DIAGNOSIS — J189 Pneumonia, unspecified organism: Secondary | ICD-10-CM | POA: Diagnosis not present

## 2020-01-17 DIAGNOSIS — R6521 Severe sepsis with septic shock: Secondary | ICD-10-CM | POA: Diagnosis present

## 2020-01-17 DIAGNOSIS — J449 Chronic obstructive pulmonary disease, unspecified: Secondary | ICD-10-CM | POA: Diagnosis not present

## 2020-01-17 HISTORY — PX: INCISIONAL HERNIA REPAIR: SHX193

## 2020-01-17 HISTORY — PX: LAPAROTOMY: SHX154

## 2020-01-17 LAB — BLOOD GAS, VENOUS
Acid-Base Excess: 8 mmol/L — ABNORMAL HIGH (ref 0.0–2.0)
Bicarbonate: 30 mmol/L — ABNORMAL HIGH (ref 20.0–28.0)
FIO2: 97
O2 Saturation: 97.6 %
Patient temperature: 39
pCO2, Ven: 58.7 mmHg (ref 44.0–60.0)
pH, Ven: 7.377 (ref 7.250–7.430)
pO2, Ven: 123 mmHg — ABNORMAL HIGH (ref 32.0–45.0)

## 2020-01-17 LAB — RESPIRATORY PANEL BY RT PCR (FLU A&B, COVID)
Influenza A by PCR: NEGATIVE
Influenza B by PCR: NEGATIVE
SARS Coronavirus 2 by RT PCR: NEGATIVE

## 2020-01-17 LAB — COMPREHENSIVE METABOLIC PANEL
ALT: 9 U/L (ref 0–44)
AST: 22 U/L (ref 15–41)
Albumin: 2.5 g/dL — ABNORMAL LOW (ref 3.5–5.0)
Alkaline Phosphatase: 94 U/L (ref 38–126)
Anion gap: 11 (ref 5–15)
BUN: 20 mg/dL (ref 6–20)
CO2: 30 mmol/L (ref 22–32)
Calcium: 8.1 mg/dL — ABNORMAL LOW (ref 8.9–10.3)
Chloride: 93 mmol/L — ABNORMAL LOW (ref 98–111)
Creatinine, Ser: 0.78 mg/dL (ref 0.61–1.24)
GFR, Estimated: >60 mL/min (ref >60)
Glucose, Bld: 120 mg/dL — ABNORMAL HIGH (ref 70–99)
Potassium: 4.3 mmol/L (ref 3.5–5.1)
Sodium: 134 mmol/L — ABNORMAL LOW (ref 135–145)
Total Bilirubin: 1.6 mg/dL — ABNORMAL HIGH (ref 0.3–1.2)
Total Protein: 8.1 g/dL (ref 6.5–8.1)

## 2020-01-17 LAB — LACTIC ACID, PLASMA
Lactic Acid, Venous: 2.1 mmol/L (ref 0.5–1.9)
Lactic Acid, Venous: 1.6 mmol/L (ref 0.5–1.9)

## 2020-01-17 LAB — TROPONIN I (HIGH SENSITIVITY)
Troponin I (High Sensitivity): 52 ng/L — ABNORMAL HIGH (ref <18)
Troponin I (High Sensitivity): 59 ng/L — ABNORMAL HIGH (ref <18)

## 2020-01-17 LAB — CBC WITH DIFFERENTIAL/PLATELET
Abs Immature Granulocytes: 0.14 10*3/uL — ABNORMAL HIGH (ref 0.00–0.07)
Basophils Absolute: 0 10*3/uL (ref 0.0–0.1)
Basophils Relative: 0 %
Eosinophils Absolute: 0.1 10*3/uL (ref 0.0–0.5)
Eosinophils Relative: 1 %
HCT: 60 % — ABNORMAL HIGH (ref 39.0–52.0)
Hemoglobin: 19.1 g/dL — ABNORMAL HIGH (ref 13.0–17.0)
Immature Granulocytes: 2 %
Lymphocytes Relative: 4 %
Lymphs Abs: 0.4 10*3/uL — ABNORMAL LOW (ref 0.7–4.0)
MCH: 28.3 pg (ref 26.0–34.0)
MCHC: 31.8 g/dL (ref 30.0–36.0)
MCV: 88.9 fL (ref 80.0–100.0)
Monocytes Absolute: 0.6 10*3/uL (ref 0.1–1.0)
Monocytes Relative: 6 %
Neutro Abs: 8.2 10*3/uL — ABNORMAL HIGH (ref 1.7–7.7)
Neutrophils Relative %: 87 %
Platelets: 346 10*3/uL (ref 150–400)
RBC: 6.75 MIL/uL — ABNORMAL HIGH (ref 4.22–5.81)
RDW: 18.4 % — ABNORMAL HIGH (ref 11.5–15.5)
WBC: 9.4 10*3/uL (ref 4.0–10.5)
nRBC: 1 % — ABNORMAL HIGH (ref 0.0–0.2)

## 2020-01-17 LAB — D-DIMER, QUANTITATIVE: D-Dimer, Quant: 3.69 ug/mL-FEU — ABNORMAL HIGH (ref 0.00–0.50)

## 2020-01-17 LAB — FERRITIN: Ferritin: 376 ng/mL — ABNORMAL HIGH (ref 24–336)

## 2020-01-17 LAB — MRSA PCR SCREENING: MRSA by PCR: NEGATIVE

## 2020-01-17 LAB — FIBRINOGEN: Fibrinogen: 708 mg/dL — ABNORMAL HIGH (ref 210–475)

## 2020-01-17 LAB — PROCALCITONIN: Procalcitonin: 0.19 ng/mL

## 2020-01-17 LAB — LACTATE DEHYDROGENASE: LDH: 170 U/L (ref 98–192)

## 2020-01-17 LAB — TRIGLYCERIDES: Triglycerides: 142 mg/dL (ref <150)

## 2020-01-17 LAB — C-REACTIVE PROTEIN: CRP: 20.2 mg/dL — ABNORMAL HIGH (ref <1.0)

## 2020-01-17 IMAGING — CT CT ABD-PELV W/ CM
2 of 5 series · 15 of 46 positions shown, 17 images · IV contrast (Omnipaque or Isovue)
Comparison: CT of same day.  [DATE].

CLINICAL DATA: Peritonitis.  Possible pneumoperitoneum.

EXAM:
CT ABDOMEN AND PELVIS WITH CONTRAST
TECHNIQUE: Multidetector CT imaging of the abdomen and pelvis was performed
using the standard protocol following bolus administration of
intravenous contrast.
CONTRAST:  80mL OMNIPAQUE IOHEXOL 300 MG/ML  SOLN

[Series 2: axial st · axial · 0.73mm/px · z∈[+657,+1062]mm · 12 of 91 slices shown, 14 images]
[im 5/91  soft-tissue]
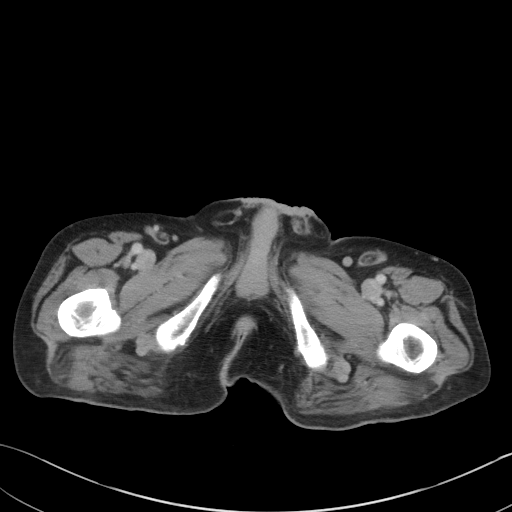
[im 5/91  bone]
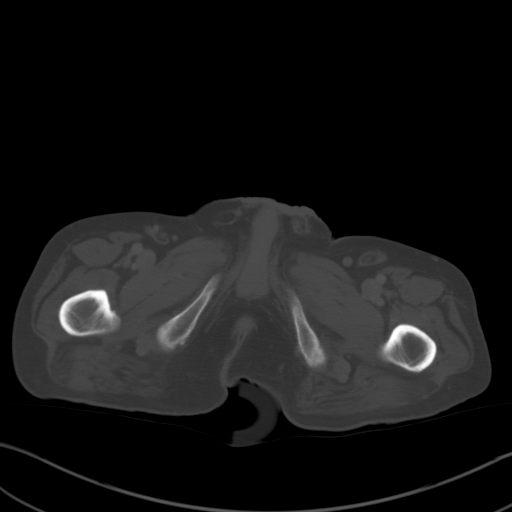
[im 15/91  soft-tissue]
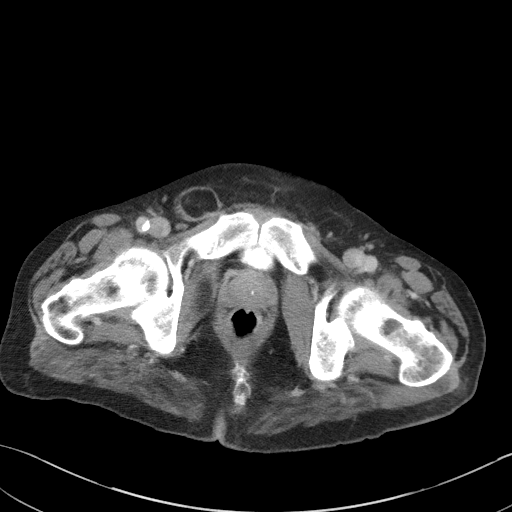
[im 19/91  soft-tissue]
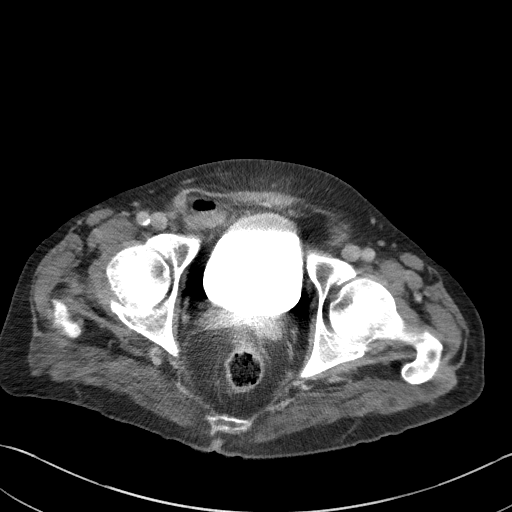
[im 29/91  soft-tissue]
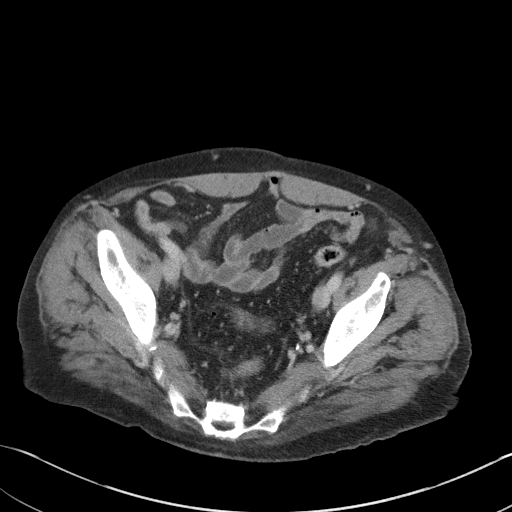
[im 34/91  soft-tissue]
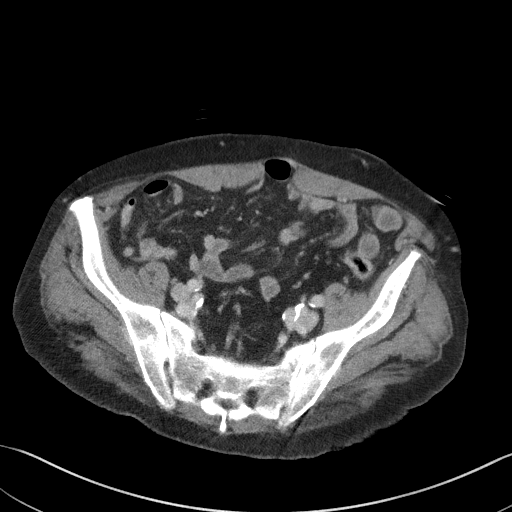
[im 43/91  soft-tissue]
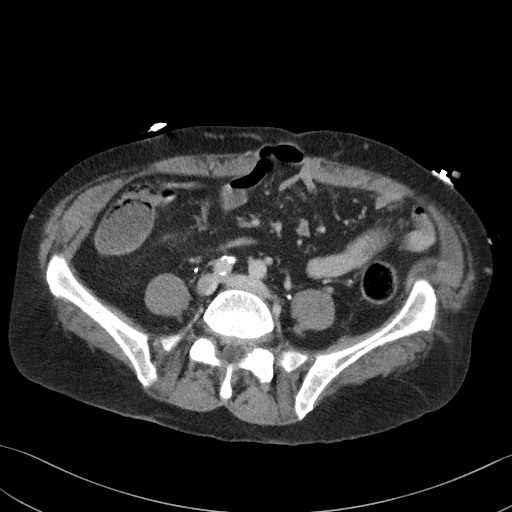
[im 48/91  soft-tissue]
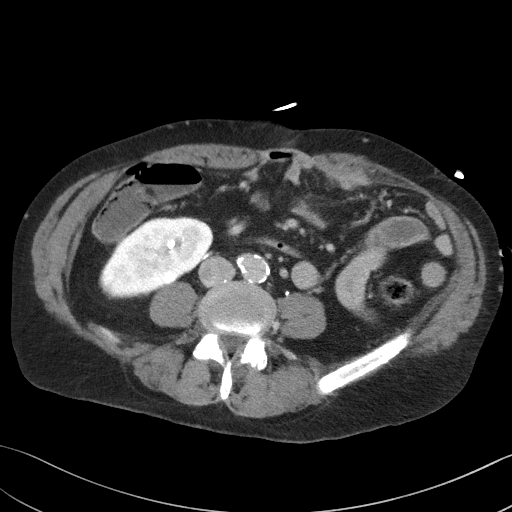
[im 57/91  soft-tissue]
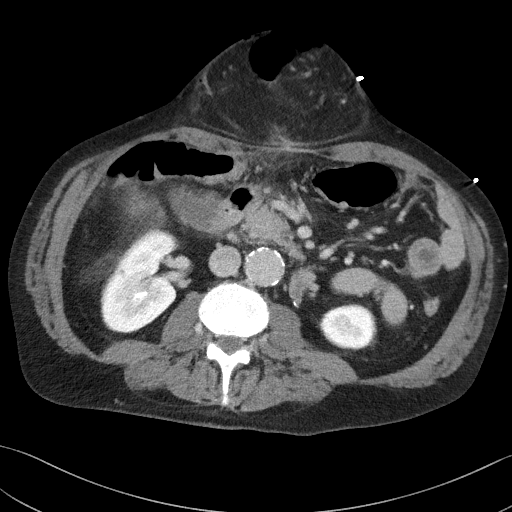
[im 62/91  soft-tissue]
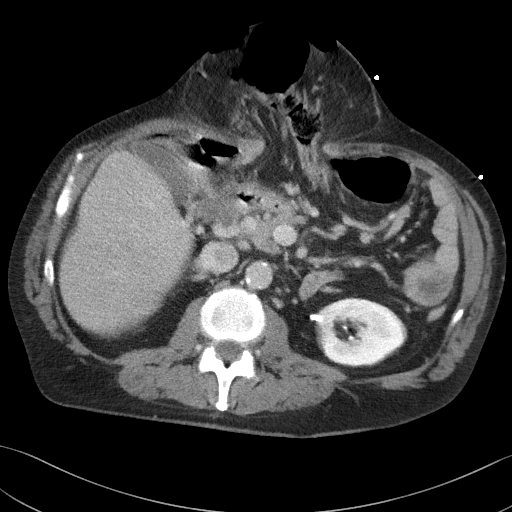
[im 62/91  bone]
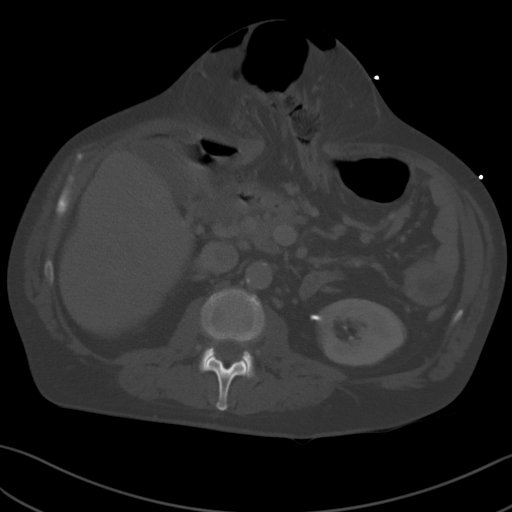
[im 72/91  soft-tissue]
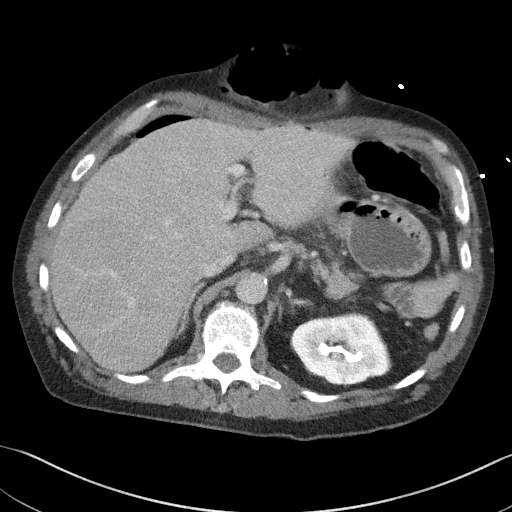
[im 76/91  soft-tissue]
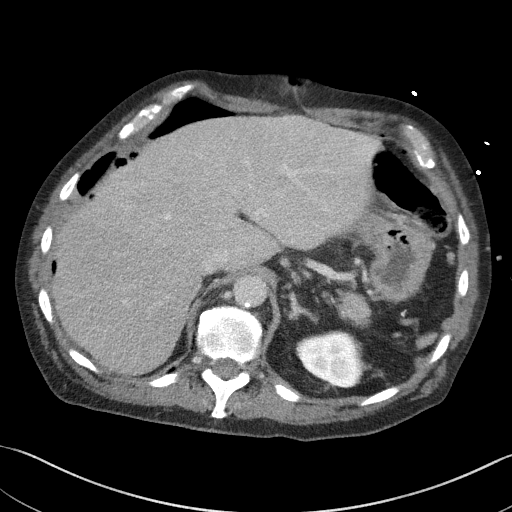
[im 86/91  soft-tissue]
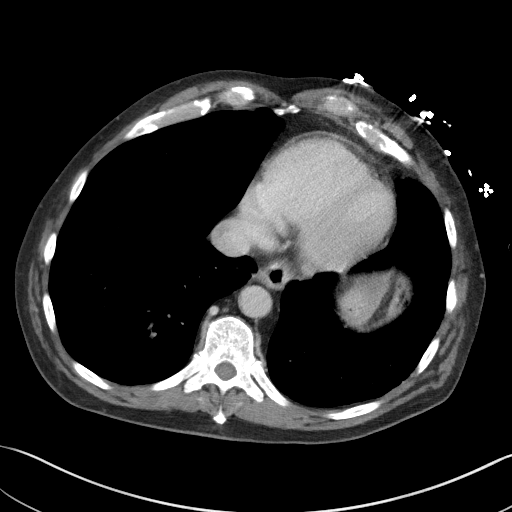

[Series 5: coronal st · coronal · 0.66mm/px · 3 of 88 slices shown]
[im 30/88  soft-tissue]
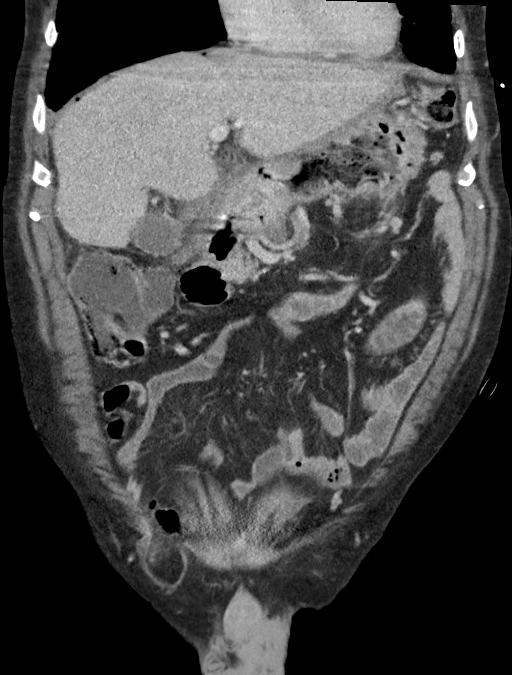
[im 39/88  soft-tissue]
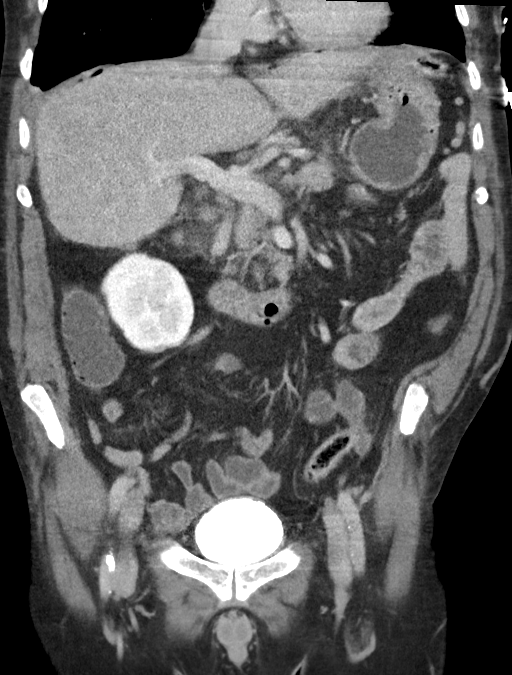
[im 49/88  soft-tissue]
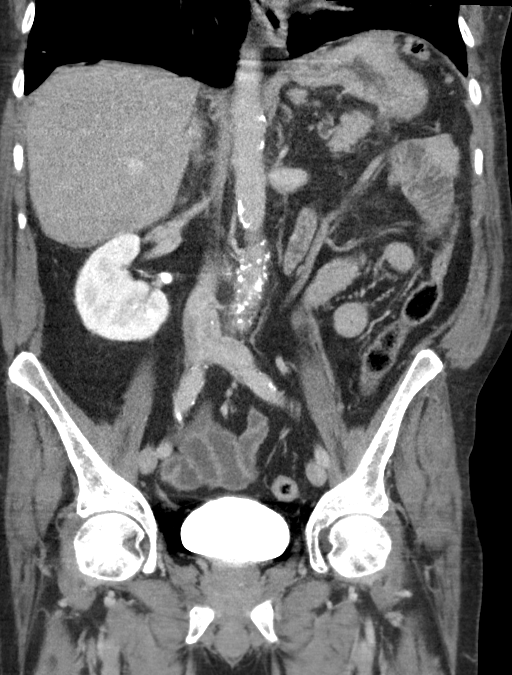

[15 of 46 positions shown; findings below may reference images not displayed]

FINDINGS: Lower chest: Stable reticular opacity seen in visualized lung bases
consistent with atypical inflammation.

Hepatobiliary: Minimal cholelithiasis is noted. No biliary
dilatation is noted. Liver is unremarkable.

Pancreas: Unremarkable. No pancreatic ductal dilatation or
surrounding inflammatory changes.

Spleen: Status post splenectomy.

Adrenals/Urinary Tract: Adrenal glands are unremarkable. Kidneys are
normal, without renal calculi, focal lesion, or hydronephrosis.
Bladder is unremarkable.

Stomach/Bowel: The appendix appears normal. Wall thickening and
surrounding inflammatory changes are seen involving the distal
stomach concerning for peptic ulcer disease. Large epigastric
ventral hernia is again noted which contains a loop of transverse
colon; there is noted dilatation of this portion of the transverse
colon. Free air is noted in the epigastric region in the right
subdiaphragmatic space and epigastric region as well as in within
the hernia, consistent with rupture of hollow viscus.

Vascular/Lymphatic: Atherosclerosis of abdominal aorta is noted.
Stable 3.1 cm infrarenal abdominal aortic aneurysm is noted. No
adenopathy is noted.

Reproductive: Prostate is unremarkable.

Other: Moderate size fat containing right inguinal hernia is noted.
No ascites is noted.

Musculoskeletal: No acute or significant osseous findings.
IMPRESSION: 1. Free air is noted in the epigastric region, in the right
subdiaphragmatic space and within the hernia, consistent with
rupture of hollow viscus. Wall thickening and surrounding
inflammatory changes are seen involving the distal stomach
concerning for peptic ulcer disease. Critical Value/emergent results
were called by telephone at the time of interpretation on [DATE]
at [DATE] to provider DEN , who verbally acknowledged
these results.
2. Large epigastric ventral hernia is again noted which contains a
loop of transverse colon; there is noted dilatation of this portion
of the transverse colon.
3. Minimal cholelithiasis is noted.
4. Stable 3.1 cm infrarenal abdominal aortic aneurysm. Recommend
follow-up ultrasound every 3 years. This recommendation follows ACR
consensus guidelines: White Paper of the ACR Incidental Findings
Committee II on Vascular Findings. [HOSPITAL] [6Q];
[DATE].
5. Moderate size fat containing right inguinal hernia.
6. Stable reticular opacity is seen in visualized lung bases
consistent with atypical inflammation.
7. Aortic atherosclerosis.

Aortic Atherosclerosis ([6Q]-[6Q]).

## 2020-01-17 IMAGING — DX DG CHEST 1V PORT
1 series · 1 of 1 positions shown · non-contrast
Comparison: [DATE].

CLINICAL DATA: Shortness of breath.

EXAM:
PORTABLE CHEST 1 VIEW

[chest ap]
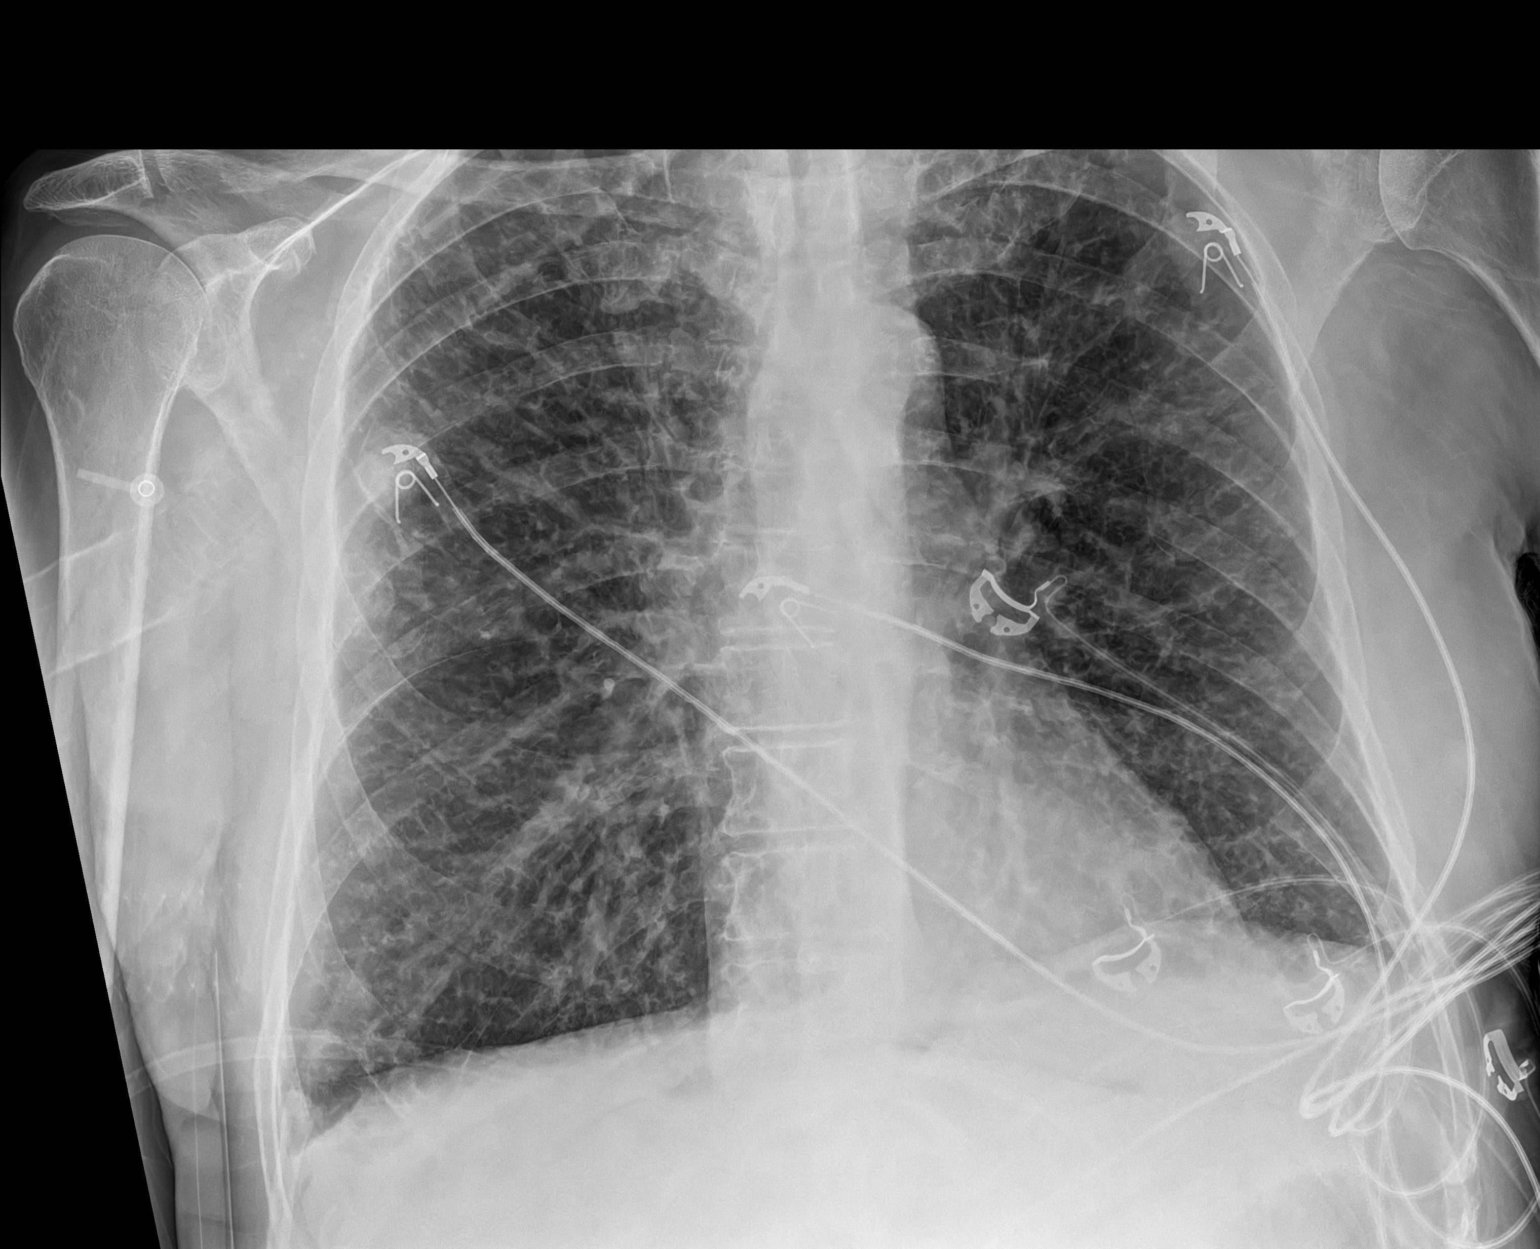

[1 of 1 positions shown; findings below may reference images not displayed]

FINDINGS: The heart size and mediastinal contours are within normal limits. No
pneumothorax or pleural effusion is noted. Stable reticular
densities are noted throughout both lungs most consistent with
chronic interstitial scarring or fibrosis, but acute superimposed
inflammation or edema cannot be excluded. The visualized skeletal
structures are unremarkable.
IMPRESSION: Stable reticular densities are noted throughout both lungs most
consistent with chronic interstitial scarring or fibrosis, but acute
superimposed inflammation or edema cannot be excluded.

## 2020-01-17 IMAGING — CT CT ANGIO CHEST
2 of 7 series · 15 of 36 positions shown · IV contrast (Omnipaque or Isovue)
Comparison: Chest x-ray from earlier in the same day, CT from
[DATE]

CLINICAL DATA: Hypoxia and elevated D-dimer

EXAM:
CT ANGIOGRAPHY CHEST WITH CONTRAST
TECHNIQUE: Multidetector CT imaging of the chest was performed using the
standard protocol during bolus administration of intravenous
contrast. Multiplanar CT image reconstructions and MIPs were
obtained to evaluate the vascular anatomy.
CONTRAST:  100mL OMNIPAQUE IOHEXOL 350 MG/ML SOLN

[Series 7: pe axial thins · axial · 0.69mm/px · z∈[+886,+1198]mm · 14 of 362 slices shown]
[im 25/362  lung]
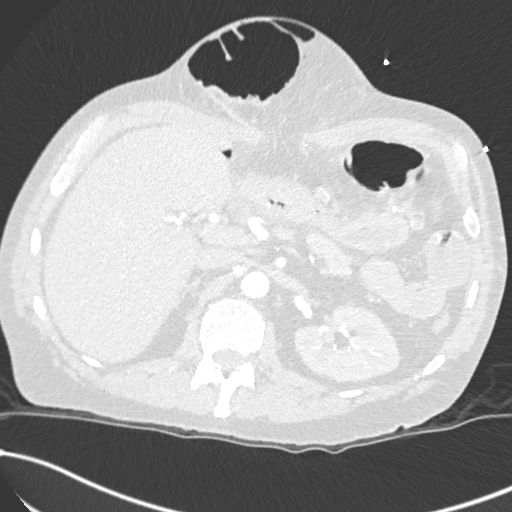
[im 49/362  mediastinal]
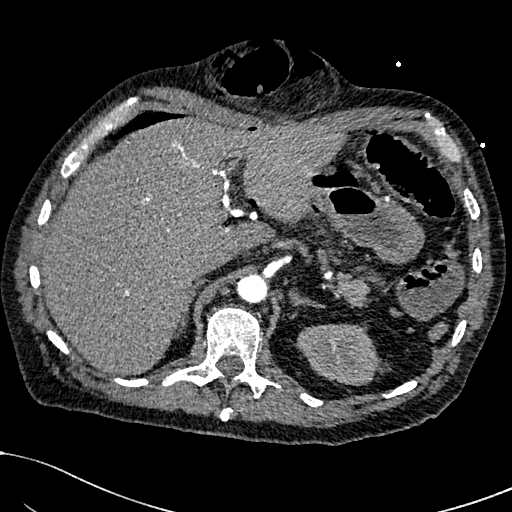
[im 73/362  lung]
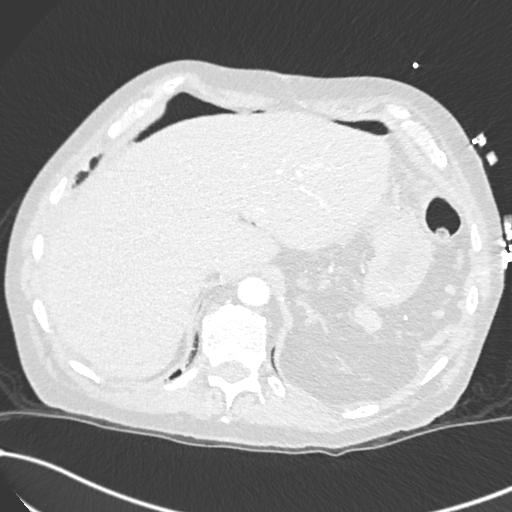
[im 97/362  mediastinal]
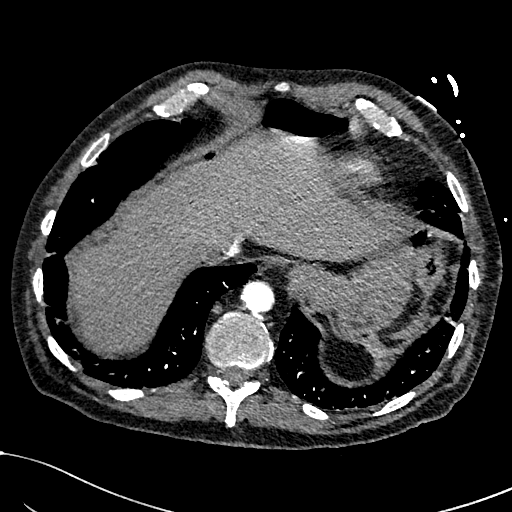
[im 121/362  lung]
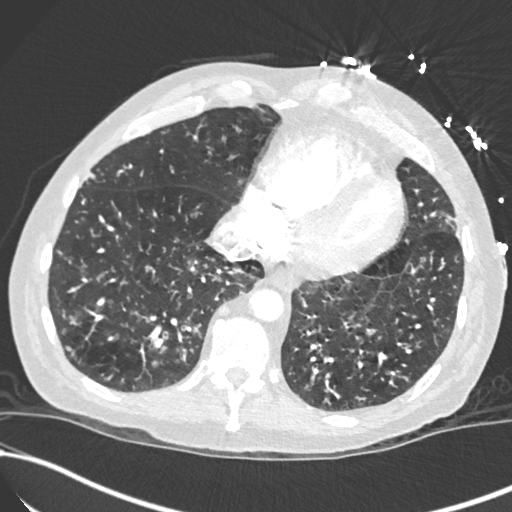
[im 145/362  mediastinal]
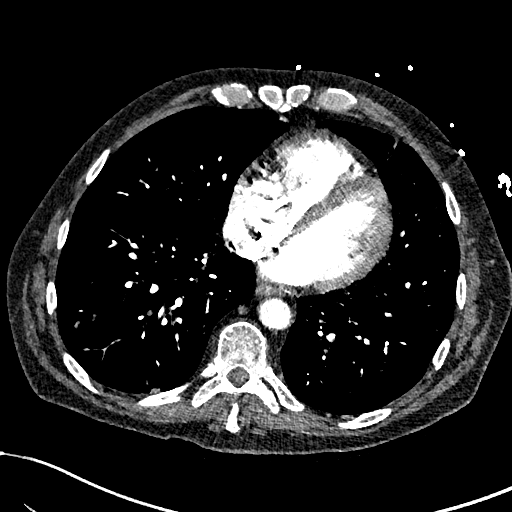
[im 169/362  lung]
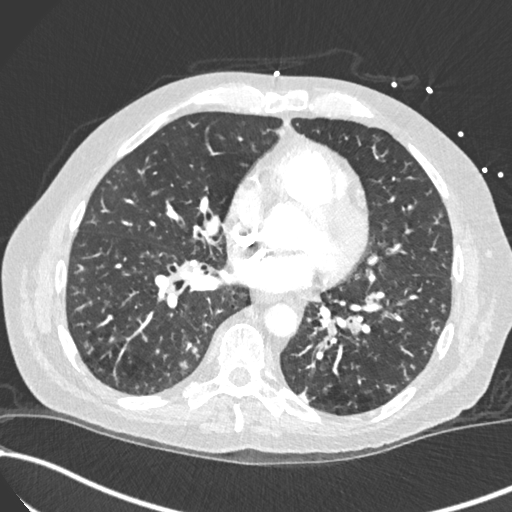
[im 193/362  mediastinal]
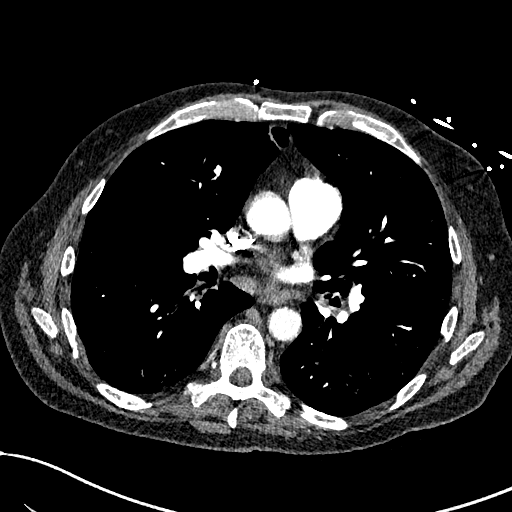
[im 217/362  lung]
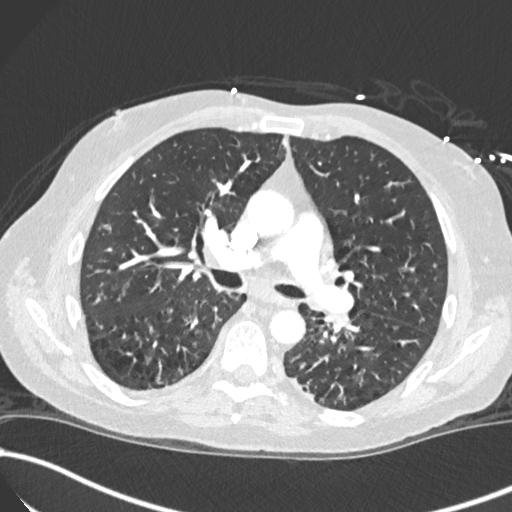
[im 241/362  mediastinal]
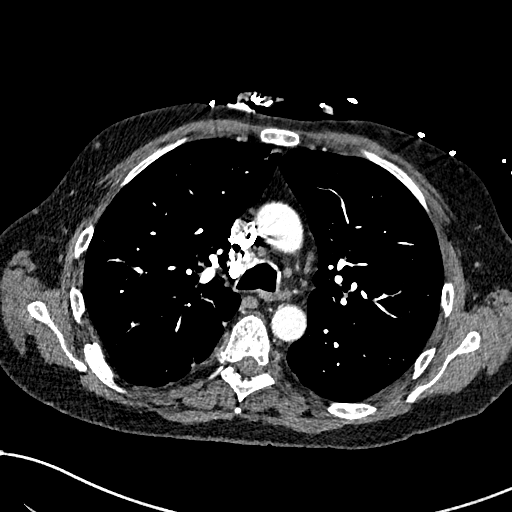
[im 265/362  lung]
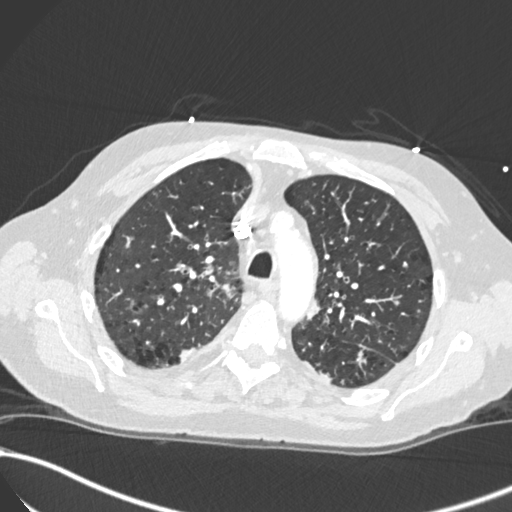
[im 289/362  mediastinal]
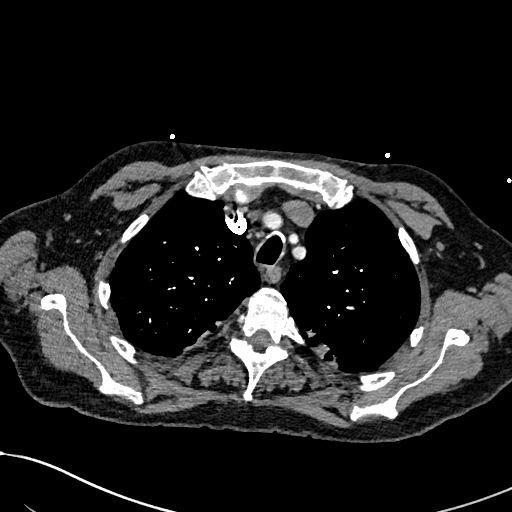
[im 313/362  lung]
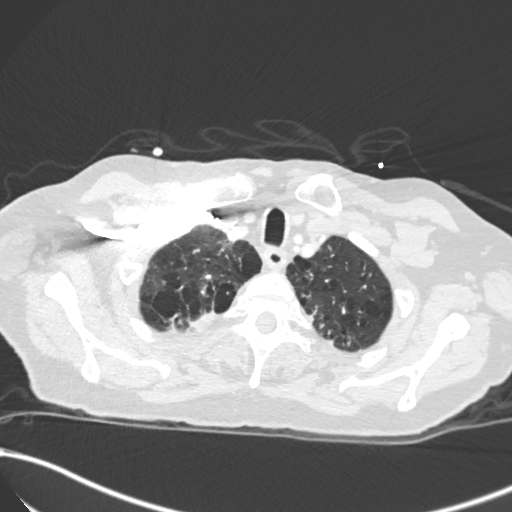
[im 337/362  mediastinal]
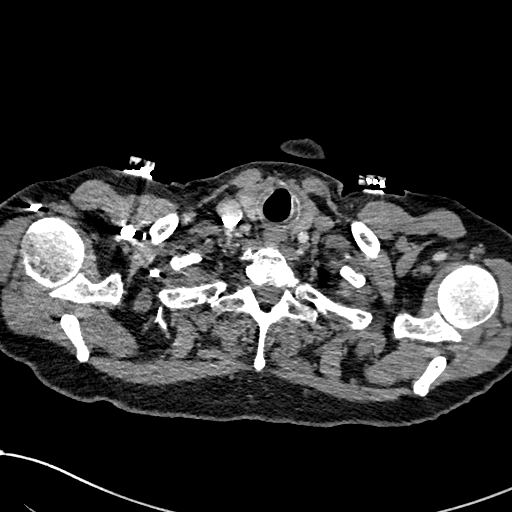

[Series 9: cor soft · coronal · 0.66mm/px · 1 of 122 slices shown]
[im 61/122  mediastinal]
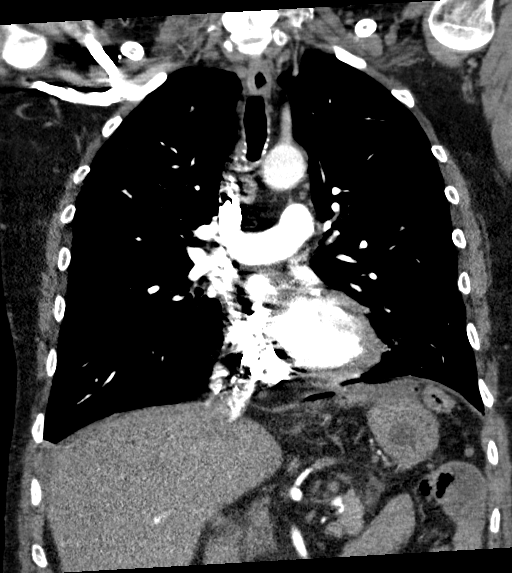

[15 of 36 positions shown; findings below may reference images not displayed]

FINDINGS: Cardiovascular: Thoracic aorta demonstrates atherosclerotic
calcification without aneurysmal dilatation or dissection. No
cardiac enlargement is noted. Coronary calcifications are seen.
Pulmonary artery demonstrates a normal branching pattern
bilaterally. No filling defect to suggest pulmonary embolism is
noted.

Mediastinum/Nodes: Thoracic inlet is within normal limits. The
esophagus is within normal limits. Scattered small hilar and
mediastinal adenopathy is noted

Lungs/Pleura: Thelungs are well aerated bilaterally and demonstrate
diffuse reticulonodular and tree-in-bud changes consistent with
multifocal inflammatory change. No focal confluent infiltrate is
noted. Mild bronchial thickening is seen as well. No sizable
parenchymal nodule is noted. No effusion or pneumothorax is seen.
Diffuse emphysematous changes are noted.

Upper Abdomen: Visualized upper abdomen demonstrates evidence of
free air as well as a large ventral hernia with loops of transverse
colon within. This does not appear to be obstructive in nature
although no CT of the abdomen and pelvis is recommended. The free
air is somewhat centered around the stomach which may be related to
a perforated gastric ulcer. Again dedicated abdominal CT is
recommended.

Musculoskeletal: Degenerative changes of the thoracic spine are
noted. Old rib fractures with healing are seen on the right.

Review of the MIP images confirms the above findings.
IMPRESSION: Diffuse reticulonodular and tree-in-bud changes consistent with
acute atypical pneumonia. No focal confluent infiltrate is noted.

No evidence of pulmonary emboli.

Free air within the abdomen with some thickening of the gastric wall
and surrounding inflammatory change suspicious for perforated
gastric ulcer. Dedicated CT of the abdomen and pelvis is
recommended.

Large ventral hernia containing transverse colon although no
definitive obstructive changes are seen. This would also be well
evaluated on a CT of the abdomen and pelvis.

Aortic Atherosclerosis ([BD]-[BD]) and Emphysema ([BD]-[BD]).

Critical Value/emergent results were called by telephone at the time
of interpretation on [DATE] at [DATE] to Dr. CHIKA MILAN ,
who verbally acknowledged these results.

## 2020-01-17 SURGERY — LAPAROTOMY, EXPLORATORY
Anesthesia: General | Site: Abdomen

## 2020-01-17 MED ORDER — IPRATROPIUM BROMIDE 0.02 % IN SOLN
RESPIRATORY_TRACT | Status: AC
  Administered 2020-01-17: 0.5 mg
  Filled 2020-01-17: qty 2.5

## 2020-01-17 MED ORDER — METHYLPREDNISOLONE SODIUM SUCC 125 MG IJ SOLR
125.0000 mg | Freq: Once | INTRAMUSCULAR | Status: AC
  Administered 2020-01-17: 125 mg via INTRAVENOUS
  Filled 2020-01-17: qty 2

## 2020-01-17 MED ORDER — IPRATROPIUM BROMIDE 0.02 % IN SOLN: 0.5000 mg | Freq: Once | RESPIRATORY_TRACT | Status: AC

## 2020-01-17 MED ORDER — PIPERACILLIN-TAZOBACTAM 3.375 G IVPB
3.3750 g | Freq: Three times a day (TID) | INTRAVENOUS | Status: DC
  Administered 2020-01-18 – 2020-01-23 (×16): 3.375 g via INTRAVENOUS
  Filled 2020-01-17 (×15): qty 50

## 2020-01-17 MED ORDER — VANCOMYCIN HCL 1500 MG/300ML IV SOLN
1500.0000 mg | Freq: Once | INTRAVENOUS | Status: AC
  Administered 2020-01-17: 1500 mg via INTRAVENOUS
  Filled 2020-01-17: qty 300

## 2020-01-17 MED ORDER — IOHEXOL 300 MG/ML  SOLN
100.0000 mL | Freq: Once | INTRAMUSCULAR | Status: AC | PRN
  Administered 2020-01-17: 80 mL via INTRAVENOUS

## 2020-01-17 MED ORDER — IOHEXOL 350 MG/ML SOLN
100.0000 mL | Freq: Once | INTRAVENOUS | Status: AC | PRN
  Administered 2020-01-17: 100 mL via INTRAVENOUS

## 2020-01-17 MED ORDER — VANCOMYCIN HCL 750 MG/150ML IV SOLN
750.0000 mg | Freq: Two times a day (BID) | INTRAVENOUS | Status: DC
  Filled 2020-01-17 (×2): qty 150

## 2020-01-17 MED ORDER — ALBUTEROL SULFATE (2.5 MG/3ML) 0.083% IN NEBU
INHALATION_SOLUTION | RESPIRATORY_TRACT | Status: AC
  Administered 2020-01-17: 5 mg
  Filled 2020-01-17: qty 6

## 2020-01-17 MED ORDER — SODIUM CHLORIDE 0.9 % IV BOLUS
1000.0000 mL | Freq: Once | INTRAVENOUS | Status: AC
  Administered 2020-01-17: 1000 mL via INTRAVENOUS

## 2020-01-17 MED ORDER — PIPERACILLIN-TAZOBACTAM 3.375 G IVPB 30 MIN: 3.3750 g | Freq: Three times a day (TID) | INTRAVENOUS | Status: DC

## 2020-01-17 MED ORDER — ACETAMINOPHEN 325 MG PO TABS
650.0000 mg | ORAL_TABLET | Freq: Once | ORAL | Status: AC
  Administered 2020-01-17: 650 mg via ORAL
  Filled 2020-01-17: qty 2

## 2020-01-17 MED ORDER — SODIUM CHLORIDE 0.9 % IV BOLUS
500.0000 mL | Freq: Once | INTRAVENOUS | Status: AC
  Administered 2020-01-17: 500 mL via INTRAVENOUS

## 2020-01-17 MED ORDER — PIPERACILLIN-TAZOBACTAM 3.375 G IVPB 30 MIN
3.3750 g | Freq: Once | INTRAVENOUS | Status: AC
  Administered 2020-01-17: 3.375 g via INTRAVENOUS
  Filled 2020-01-17: qty 50

## 2020-01-17 MED ORDER — ALBUTEROL SULFATE (2.5 MG/3ML) 0.083% IN NEBU: 5.0000 mg | INHALATION_SOLUTION | Freq: Once | RESPIRATORY_TRACT | Status: AC

## 2020-01-17 SURGICAL SUPPLY — 44 items
CHLORAPREP W/TINT 26 (MISCELLANEOUS) ×4 IMPLANT
COVER MAYO STAND STRL (DRAPES) ×4 IMPLANT
COVER SURGICAL LIGHT HANDLE (MISCELLANEOUS) ×4 IMPLANT
COVER WAND RF STERILE (DRAPES) IMPLANT
DRAIN CHANNEL 19F RND (DRAIN) ×4 IMPLANT
DRAPE LAPAROSCOPIC ABDOMINAL (DRAPES) ×4 IMPLANT
DRAPE UTILITY XL STRL (DRAPES) ×4 IMPLANT
DRAPE WARM FLUID 44X44 (DRAPES) ×4 IMPLANT
DRSG OPSITE POSTOP 4X10 (GAUZE/BANDAGES/DRESSINGS) ×4 IMPLANT
DRSG OPSITE POSTOP 4X6 (GAUZE/BANDAGES/DRESSINGS) IMPLANT
DRSG OPSITE POSTOP 4X8 (GAUZE/BANDAGES/DRESSINGS) IMPLANT
ELECT REM PT RETURN 15FT ADLT (MISCELLANEOUS) ×4 IMPLANT
EVACUATOR SILICONE 100CC (DRAIN) ×4 IMPLANT
GLOVE BIO SURGEON STRL SZ8 (GLOVE) ×12 IMPLANT
GLOVE BIOGEL PI IND STRL 7.0 (GLOVE) ×2 IMPLANT
GLOVE BIOGEL PI IND STRL 8 (GLOVE) ×2 IMPLANT
GLOVE BIOGEL PI INDICATOR 7.0 (GLOVE) ×2
GLOVE BIOGEL PI INDICATOR 8 (GLOVE) ×2
GLOVE SURG SS PI 7.0 STRL IVOR (GLOVE) ×4 IMPLANT
GOWN STRL REUS W/TWL LRG LVL3 (GOWN DISPOSABLE) ×4 IMPLANT
GOWN STRL REUS W/TWL XL LVL3 (GOWN DISPOSABLE) ×4 IMPLANT
HANDLE SUCTION POOLE (INSTRUMENTS) ×2 IMPLANT
KIT BASIN OR (CUSTOM PROCEDURE TRAY) ×4 IMPLANT
KIT TURNOVER KIT A (KITS) ×4 IMPLANT
PACK GENERAL/GYN (CUSTOM PROCEDURE TRAY) ×4 IMPLANT
PAD TELFA 2X3 NADH STRL (GAUZE/BANDAGES/DRESSINGS) ×4 IMPLANT
PENCIL SMOKE EVACUATOR (MISCELLANEOUS) IMPLANT
RELOAD PROXIMATE 100 BLUE (MISCELLANEOUS) IMPLANT
RELOAD PROXIMATE 100MM BLUE (MISCELLANEOUS)
SPONGE DRAIN TRACH 4X4 STRL 2S (GAUZE/BANDAGES/DRESSINGS) ×4 IMPLANT
SPONGE LAP 18X18 RF (DISPOSABLE) IMPLANT
STAPLER PROXIMATE 100MM BLUE (MISCELLANEOUS) IMPLANT
STAPLER VISISTAT 35W (STAPLE) ×4 IMPLANT
SUCTION POOLE HANDLE (INSTRUMENTS) ×4
SUT ETHILON 2 0 PS N (SUTURE) ×4 IMPLANT
SUT NOVA NAB GS-21 0 18 T12 DT (SUTURE) ×8 IMPLANT
SUT PDS AB 0 CT1 36 (SUTURE) ×8 IMPLANT
SUT SILK 2 0 SH CR/8 (SUTURE) ×4 IMPLANT
SUT SILK 3 0 SH CR/8 (SUTURE) ×4 IMPLANT
TAPE CLOTH SURG 4X10 WHT LF (GAUZE/BANDAGES/DRESSINGS) ×4 IMPLANT
TOWEL OR 17X26 10 PK STRL BLUE (TOWEL DISPOSABLE) ×4 IMPLANT
TOWEL OR NON WOVEN STRL DISP B (DISPOSABLE) ×4 IMPLANT
TRAY FOLEY MTR SLVR 16FR STAT (SET/KITS/TRAYS/PACK) ×4 IMPLANT
YANKAUER SUCT BULB TIP NO VENT (SUCTIONS) ×4 IMPLANT

## 2020-01-17 NOTE — ED Triage Notes (Signed)
Pt brought to ED via [facility]. Pt family states pt been laying around x 1 week. Pt sats 80 upon arrival per EMS. Pt placed on 15L NR. Pt tachycardic 130-140's.

## 2020-01-17 NOTE — ED Provider Notes (Signed)
[facility] EMERGENCY DEPARTMENT Provider Note   CSN: [id number] Arrival date & time: 01/17/20  1445     History Chief Complaint  Patient presents with  . Respiratory Distress    Marcus [name] is a 59 y.o. male.  HPI Presents for evaluation of shortness of breath, worsening over 1 week, by EMS.  Reportedly has had 2 Covid vaccines.  He is unable to give any history.  He was transferred on oxygen because of hypoxia in the field.  Level 5 caveat-severe illness    Past Medical History:  Diagnosis Date  . COPD (chronic obstructive pulmonary disease) ([place])   . 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 Hernia, epigastric   . Pancytopenia ([place])   . Seropositive rheumatoid arthritis ([place])   . Tobacco use disorder     Patient Active Problem List   Diagnosis Date Noted  . COPD with acute exacerbation ([place]) 08/18/2019  . 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 Anemia 11/14/2015  . Seropositive rheumatoid arthritis ([place])   . Chronic obstructive pulmonary disease ([place])   . Protein-calorie malnutrition, severe 07/24/2015  . Pancytopenia ([place]) 07/23/2015  . 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[place]) 06/06/2013  . Leukopenia 10/21/2012  . 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 Abnormal EKG 10/21/2012  . Joint pain 10/21/2012  . Splenomegaly 10/21/2012    History reviewed. No pertinent surgical history.     Family History  Problem Relation Age of Onset  . CAD Brother   . COPD Brother     Social History   Tobacco Use  . Smoking status: Current Every Day Smoker    Packs/day: 1.00    Types: Cigarettes  . Smokeless tobacco: Never Used  Substance Use Topics  . Alcohol use: No    Alcohol/week: 0.0 standard drinks  . Drug use: Yes    Types: Marijuana    Home Medications Prior to Admission medications   Medication Sig Start Date End Date Taking? Authorizing Provider  albuterol (PROVENTIL) (2.5 MG/3ML) 0.083% nebulizer solution Take 3 mLs (2.5 mg total) by nebulization every 6 (six) hours as needed for wheezing or  shortness of breath. 08/20/19   Domenic Polite, MD  albuterol (VENTOLIN HFA) 108 (90 Base) MCG/ACT inhaler Inhale 2 puffs into the lungs every 6 (six) hours as needed for wheezing or shortness of breath. 08/20/19   Domenic Polite, MD  azithromycin (ZITHROMAX Z-PAK) 250 MG tablet Take 2 tablets (500 mg) on  Day 1,  followed by 1 tablet (250 mg) once daily on Days 2 through 5. Patient not taking: Reported on 01/17/2020 12/01/19   Carrie Mew, MD  mometasone-formoterol [facility]) 200-5 MCG/ACT AERO Inhale 1 puff into the lungs 2 (two) times daily. 08/20/19   Domenic Polite, MD    Allergies    Levaquin [levofloxacin in d5w] and Methotrexate  Review of Systems   Review of Systems  Unable to perform ROS: Acuity of condition    Physical Exam Updated Vital Signs BP 100/65   Pulse 70   Temp 99.8 F (37.7 C) (Axillary)   Resp (!) 24   Wt 63.8 kg   SpO2 91%   BMI 20.18 kg/m   Physical Exam Vitals and nursing note reviewed.  Constitutional:      Appearance: He is well-developed.  HENT:     Head: Normocephalic and atraumatic.     Right Ear: External ear normal.     Left Ear: External ear normal.     Nose: Congestion present.     Comments: Oral mucous membranes dry, white plaque  on tongue. Eyes:     Conjunctiva/sclera: Conjunctivae normal.     Pupils: Pupils are equal, round, and reactive to light.  Neck:     Trachea: Phonation normal.  Cardiovascular:     Rate and Rhythm: Tachycardia present.  Pulmonary:     Effort: Respiratory distress present.     Breath sounds: No stridor.     Comments: Tachypnea with grunting respirations, splinting in tripod position. Chest:     Chest wall: No tenderness.  Abdominal:     General: There is no distension.     Palpations: Abdomen is soft.  Musculoskeletal:        General: No swelling or tenderness. Normal range of motion.     Cervical back: Normal range of motion and neck supple. No rigidity.  Skin:    General: Skin is warm and dry.      Coloration: Skin is not jaundiced or pale.  Neurological:     Mental Status: He is alert.     Cranial Nerves: No cranial nerve deficit.     Sensory: No sensory deficit.     Motor: No abnormal muscle tone.     Coordination: Coordination normal.  Psychiatric:     Comments: [place]     ED Results / Procedures / Treatments   Labs (all labs ordered are listed, but only abnormal results are displayed) Labs Reviewed  LACTIC ACID, PLASMA - Abnormal; Notable for the following components:      Result Value   Lactic Acid, Venous 2.1 (*)    All other components within normal limits  CBC WITH DIFFERENTIAL/PLATELET - Abnormal; Notable for the following components:   RBC 6.75 (*)    Hemoglobin 19.1 (*)    HCT 60.0 (*)    RDW 18.4 (*)    nRBC 1.0 (*)    Neutro Abs 8.2 (*)    Lymphs Abs 0.4 (*)    Abs Immature Granulocytes 0.14 (*)    All other components within normal limits  COMPREHENSIVE METABOLIC PANEL - Abnormal; Notable for the following components:   Sodium 134 (*)    Chloride 93 (*)    Glucose, Bld 120 (*)    Calcium 8.1 (*)    Albumin 2.5 (*)    Total Bilirubin 1.6 (*)    All other components within normal limits  D-DIMER, QUANTITATIVE (NOT AT [facility]) - Abnormal; Notable for the following components:   D-Dimer, Quant 3.69 (*)    All other components within normal limits  FERRITIN - Abnormal; Notable for the following components:   Ferritin 376 (*)    All other components within normal limits  FIBRINOGEN - Abnormal; Notable for the following components:   Fibrinogen 708 (*)    All other components within normal limits  C-REACTIVE PROTEIN - Abnormal; Notable for the following components:   CRP 20.2 (*)    All other components within normal limits  BLOOD GAS, VENOUS - Abnormal; Notable for the following components:   pO2, Ven 123.0 (*)    Bicarbonate 30.0 (*)    Acid-Base Excess 8.0 (*)    All other components within normal limits  TROPONIN I (HIGH SENSITIVITY) -  Abnormal; Notable for the following components:   Troponin I (High Sensitivity) 52 (*)    All other components within normal limits  TROPONIN I (HIGH SENSITIVITY) - Abnormal; Notable for the following components:   Troponin I (High Sensitivity) 59 (*)    All other components within normal limits  RESPIRATORY PANEL BY RT  PCR (FLU A&B, COVID)  CULTURE, BLOOD (ROUTINE X 2)  CULTURE, BLOOD (ROUTINE X 2)  MRSA PCR SCREENING  LACTIC ACID, PLASMA  PROCALCITONIN  LACTATE DEHYDROGENASE  TRIGLYCERIDES    EKG EKG Interpretation  Date/Time:  Tuesday January 17 2020 15:04:10 EDT Ventricular Rate:  130 PR Interval:    QRS Duration: 135 QT Interval:  339 QTC Calculation: 499 R Axis:   -97 Text Interpretation: Sinus tachycardia Consider right atrial enlargement Right bundle branch block Inferior infarct, acute Probable anterior infarct, age indeterminate Lateral leads are also involved >>> Acute MI <<< Since last tracing rate faster Nonspecific ST and T wave abnormality Confirmed by Daleen Bo [phone]) on 01/17/2020 3:13:18 PM   Radiology CT Angio Chest PE W/Cm &/Or Wo Cm  Result Date: 01/17/2020 CLINICAL DATA:  Hypoxia and elevated D-dimer EXAM: CT ANGIOGRAPHY CHEST WITH CONTRAST TECHNIQUE: Multidetector CT imaging of the chest was performed using the standard protocol during bolus administration of intravenous contrast. Multiplanar CT image reconstructions and MIPs were obtained to evaluate the vascular anatomy. CONTRAST:  1104mL OMNIPAQUE IOHEXOL 350 MG/ML SOLN COMPARISON:  Chest x-ray from earlier in the same day, CT from 11/14/2015 FINDINGS: Cardiovascular: Thoracic aorta demonstrates atherosclerotic calcification without aneurysmal dilatation or dissection. No cardiac enlargement is noted. Coronary calcifications are seen. Pulmonary artery demonstrates a normal branching pattern bilaterally. No filling defect to suggest pulmonary embolism is noted. Mediastinum/Nodes: Thoracic inlet is within  normal limits. The esophagus is within normal limits. Scattered small hilar and mediastinal adenopathy is noted Lungs/Pleura: Thelungs are well aerated bilaterally and demonstrate diffuse reticulonodular and tree-in-bud changes consistent with multifocal inflammatory change. No focal confluent infiltrate is noted. Mild bronchial thickening is seen as well. No sizable parenchymal nodule is noted. No effusion or pneumothorax is seen. Diffuse emphysematous changes are noted. Upper Abdomen: Visualized upper abdomen demonstrates evidence of free air as well as a large ventral hernia with loops of transverse colon within. This does not appear to be obstructive in nature although no CT of the abdomen and pelvis is recommended. The free air is somewhat centered around the stomach which may be related to a perforated gastric ulcer. Again dedicated abdominal CT is recommended. Musculoskeletal: Degenerative changes of the thoracic spine are noted. Old rib fractures with healing are seen on the right. Review of the MIP images confirms the above findings. IMPRESSION: Diffuse reticulonodular and tree-in-bud changes consistent with acute atypical pneumonia. No focal confluent infiltrate is noted. No evidence of pulmonary emboli. Free air within the abdomen with some thickening of the gastric wall and surrounding inflammatory change suspicious for perforated gastric ulcer. Dedicated CT of the abdomen and pelvis is recommended. Large ventral hernia containing transverse colon although no definitive obstructive changes are seen. This would also be well evaluated on a CT of the abdomen and pelvis. Aortic Atherosclerosis (ICD10-I70.0) and Emphysema (ICD10-J43.9). Critical Value/emergent results were called by telephone at the time of interpretation on 01/17/2020 at 5:54 pm to Dr. Daleen Bo , who verbally acknowledged these results. Electronically Signed   By: Inez Catalina M.D.   On: 01/17/2020 17:55   CT Abdomen Pelvis W  Contrast  Result Date: 01/17/2020 CLINICAL DATA:  Peritonitis.  Possible pneumoperitoneum. EXAM: CT ABDOMEN AND PELVIS WITH CONTRAST TECHNIQUE: Multidetector CT imaging of the abdomen and pelvis was performed using the standard protocol following bolus administration of intravenous contrast. CONTRAST:  87mL OMNIPAQUE IOHEXOL 300 MG/ML  SOLN COMPARISON:  CT of same day.  Aug 19, 2019. FINDINGS: Lower chest: Stable reticular opacity  seen in visualized lung bases consistent with atypical inflammation. Hepatobiliary: Minimal cholelithiasis is noted. No biliary dilatation is noted. Liver is unremarkable. Pancreas: Unremarkable. No pancreatic ductal dilatation or surrounding inflammatory changes. Spleen: Status post splenectomy. Adrenals/Urinary Tract: Adrenal glands are unremarkable. Kidneys are normal, without renal calculi, focal lesion, or hydronephrosis. Bladder is unremarkable. Stomach/Bowel: The appendix appears normal. Wall thickening and surrounding inflammatory changes are seen involving the distal stomach concerning for peptic ulcer disease. Large epigastric ventral hernia is again noted which contains a loop of transverse colon; there is noted dilatation of this portion of the transverse colon. Free air is noted in the epigastric region in the right subdiaphragmatic space and epigastric region as well as in within the hernia, consistent with rupture of hollow viscus. Vascular/Lymphatic: Atherosclerosis of abdominal aorta is noted. Stable 3.1 cm infrarenal abdominal aortic aneurysm is noted. No adenopathy is noted. Reproductive: Prostate is unremarkable. Other: Moderate size fat containing right inguinal hernia is noted. No ascites is noted. Musculoskeletal: No acute or significant osseous findings. IMPRESSION: 1. Free air is noted in the epigastric region, in the right subdiaphragmatic space and within the hernia, consistent with rupture of hollow viscus. Wall thickening and surrounding inflammatory changes  are seen involving the distal stomach concerning for peptic ulcer disease. Critical Value/emergent results were called by telephone at the time of interpretation on 01/17/2020 at 7:57 pm to provider [facility] , who verbally acknowledged these results. 2. Large epigastric ventral hernia is again noted which contains a loop of transverse colon; there is noted dilatation of this portion of the transverse colon. 3. Minimal cholelithiasis is noted. 4. Stable 3.1 cm infrarenal abdominal aortic aneurysm. Recommend follow-up ultrasound every 3 years. This recommendation follows ACR consensus guidelines: White Paper of the ACR Incidental Findings Committee II on Vascular Findings. J Am Coll Radiol 2013; 10:789-794. 5. Moderate size fat containing right inguinal hernia. 6. Stable reticular opacity is seen in visualized lung bases consistent with atypical inflammation. 7. Aortic atherosclerosis. Aortic Atherosclerosis (ICD10-I70.0). Electronically Signed   By: Marijo Conception M.D.   On: 01/17/2020 19:58   DG Chest Port 1 View  Result Date: 01/17/2020 CLINICAL DATA:  Shortness of breath. EXAM: PORTABLE CHEST 1 VIEW COMPARISON:  November 30, 2019. FINDINGS: The heart size and mediastinal contours are within normal limits. No pneumothorax or pleural effusion is noted. Stable reticular densities are noted throughout both lungs most consistent with chronic interstitial scarring or fibrosis, but acute superimposed inflammation or edema cannot be excluded. The visualized skeletal structures are unremarkable. IMPRESSION: Stable reticular densities are noted throughout both lungs most consistent with chronic interstitial scarring or fibrosis, but acute superimposed inflammation or edema cannot be excluded. Electronically Signed   By: Marijo Conception M.D.   On: 01/17/2020 15:51    Procedures .Critical Care Performed by: Daleen Bo, MD Authorized by: Daleen Bo, MD   Critical care provider statement:    Critical  care time (minutes):  140   Critical care start time:  01/17/2020 3:00 PM   Critical care end time:  01/17/2020 9:33 PM   Critical care time was exclusive of:  Separately billable procedures and treating other patients   Critical care was necessary to treat or prevent imminent or life-threatening deterioration of the following conditions:  Sepsis   Critical care was time spent personally by me on the following activities:  Blood draw for specimens, development of treatment plan with patient or surrogate, discussions with consultants, evaluation of patient's response to treatment, examination  of patient, obtaining history from patient or surrogate, ordering and performing treatments and interventions, ordering and review of laboratory studies, pulse oximetry, re-evaluation of patient's condition, review of old charts and ordering and review of radiographic studies   (including critical care time)  Medications Ordered in ED Medications  vancomycin (VANCOREADY) IVPB 1500 mg/300 mL (1,500 mg Intravenous New Bag/Given (Non-Interop) 01/17/20 2009)  vancomycin (VANCOREADY) IVPB 750 mg/150 mL (has no administration in time range)  methylPREDNISolone sodium succinate (SOLU-MEDROL) 125 mg/2 mL injection 125 mg (125 mg Intravenous Given 01/17/20 1518)  acetaminophen (TYLENOL) tablet 650 mg (650 mg Oral Given 01/17/20 1629)  sodium chloride 0.9 % bolus 500 mL (0 mLs Intravenous Stopped 01/17/20 1746)  iohexol (OMNIPAQUE) 350 MG/ML injection 100 mL (100 mLs Intravenous Contrast Given 01/17/20 1728)  piperacillin-tazobactam (ZOSYN) IVPB 3.375 g (0 g Intravenous Stopped 01/17/20 1858)  sodium chloride 0.9 % bolus 1,000 mL (0 mLs Intravenous Stopped 01/17/20 2009)  albuterol (PROVENTIL) (2.5 MG/3ML) 0.083% nebulizer solution 5 mg (5 mg Nebulization Given 01/17/20 1821)  ipratropium (ATROVENT) nebulizer solution 0.5 mg (0.5 mg Nebulization Given 01/17/20 1821)  iohexol (OMNIPAQUE) 300 MG/ML solution 100 mL (80  mLs Intravenous Contrast Given 01/17/20 1934)    ED Course  I have reviewed the triage vital signs and the nursing notes.  Pertinent labs & imaging results that were available during my care of the patient were reviewed by me and considered in my medical decision making (see chart for details).  Clinical Course as of Jan 17 2131  Tue Jan 17, 2020  1510 I talked to his brother Jeneen Rinks on the phone, he wants [place] to do anything we can.   [EW]  1530 At that time I initially saw the patient oxygen saturation was mid 80s on facemask oxygen however it was not fitting well.  The patient has now been transitioned to BiPAP, and saturating 97%.  He remains on oxygen supplementation.   [EW]  1650 Normal  Respiratory Panel by RT PCR (Flu A&B, Covid) - Nasopharyngeal Swab [EW]  1651 Normal except PO2 high, bicarb elevated, acid-base high  Blood gas, venous(!) [EW]  1651 Abnormal, high  Ferritin(!) [EW]  1651 Slightly elevated  Lactic acid, plasma(!!) [EW]  1651 Normal  Lactate dehydrogenase [EW]  1651 No except white count low, chloride low, glucose high, calcium low, total bilirubin high  Comprehensive metabolic panel(!) [EW]  1478 Abnormal, high  D-dimer, quantitative(!) [EW]  1652 Normal  Procalcitonin [EW]  1652 Abnormal, high  C-reactive protein(!) [EW]  1652 Normal except hemoglobin high  CBC WITH DIFFERENTIAL(!) [EW]  1810 I discussed CT chest findings with radiologist, who states patient has atypical infectious process in the lungs, unspecified.  He also has free air in his abdomen from suspected gastric perforation.  CT abdomen pelvis ordered   [EW]  1819 Patient's BiPAP removed so I could communicate with him better.  After initially coughing some following removal of BiPAP he was able to communicate in fairly normal voice without respiratory distress or difficulty.  He states his primary problem, which he came in for, and now, is shortness of breath.  He also states he has been able to  eat because of the shortness of breath for several days.  He uses an inhaler at home for his breathing.  He does not use oxygen.  When I asked him if his abdomen was hurting he pointed to his mid abdomen, below his large midline abdominal wall hernia.  Oxygen saturation 5 minutes after moving  from BiPAP 95% on nasal cannula supplementation.   [EW]  4782 Blood pressure now 86 systolic.   [EW]  1829 CT Abdomen Pelvis W Contrast [EW]  1931 CT Angio Chest PE W/Cm &/Or Wo Cm [EW]  2009 Case discussed with critical care, Dr. Lucile Shutters.  He recommends proceeding with surgical consultation for urgent surgical intervention for gastric perforation.  His service will be available if the patient cannot be extubated, postoperatively.   [EW]  2131 I discussed the case with general surgery at [facility] in [place].  Dr. Kieth Brightly will see the patient in the preop area at that facility.  I also discussed the case with Dr. Alvino Chapel, from the ED in case the patient has to stop there.   [EW]    Clinical Course User Index [EW] Daleen Bo, MD   MDM Rules/Calculators/A&P                           Patient Vitals for the past 24 hrs:  BP Temp Temp src Pulse Resp SpO2 Weight  01/17/20 2100 100/65 -- -- 70 (!) 24 91 % --  01/17/20 2030 102/70 -- -- 74 (!) 22 92 % --  01/17/20 2000 94/68 -- -- 99 (!) 28 90 % --  01/17/20 1930 98/63 -- -- 75 (!) 34 92 % --  01/17/20 1900 (!) 91/55 -- -- 78 (!) 37 91 % --  01/17/20 1830 (!) 79/53 -- -- 79 (!) 34 92 % --  01/17/20 1821 -- -- -- -- -- 93 % --  01/17/20 1753 (!) 66/43 -- -- 85 (!) 29 96 % --  01/17/20 1752 (!) 65/49 -- -- 84 (!) 26 96 % --  01/17/20 1749 (!) 75/53 -- -- 84 (!) 27 96 % --  01/17/20 1700 90/66 -- -- 92 (!) 30 91 % --  01/17/20 1630 (!) 90/58 99.8 F (37.7 C) Axillary 69 (!) 36 95 % --  01/17/20 1617 (!) 98/59 -- -- (!) 121 (!) 30 95 % --  01/17/20 1515 -- -- -- (!) 124 (!) 43 96 % --  01/17/20 1510 -- (!) 102.2 F (39  C) Axillary -- -- -- --  01/17/20 1500 (!) 103/59 -- -- (!) 51 (!) 28 (!) 84 % --  01/17/20 1453 115/83 -- -- (!) 132 (!) 28 100 % --  01/17/20 1451 -- -- -- -- -- -- 63.8 kg    8:02 PM Reevaluation with update and discussion. After initial assessment and treatment, an updated evaluation reveals blood pressure is now greater than 95 systolic.  Patient states "I hurt all over."  He also points to his mid abdomen as the site of pain.  Findings discussed with patient and brother. Daleen Bo   Medical Decision Making:  This patient is presenting for evaluation of respiratory distress, which does require a range of treatment options, and is a complaint that involves a high risk of morbidity and mortality. The differential diagnoses include Covid infection, pneumonia, congestive heart failure, respiratory collapse. I decided to review old records, and in summary middle-aged male, heavy smoker presenting for shortness of breath, with fever.  I obtained additional historical information from 2 of his brothers, one on the phone and one in person.  Clinical Laboratory Tests Ordered, included CBC, Metabolic panel and Covid test, lactate, blood cultures. Review indicates sepsis markers with minimally elevated lactate, normal white count.  Inflammatory markers elevated.Metabolic panel with mild hyponatremia and  hypochloremia.  D-dimer elevated. Radiologic Tests Ordered, included chest x-ray, CTA chest, CT abdomen pelvis.  I independently Visualized: Radiographic images, which show pulmonary infection nonspecific.  Free air in abdomen from suspected gastric perforation  Cardiac Monitor Tracing which shows sinus tachycardia     Critical Interventions-clinical evaluation, laboratory testing, radiography, stabilization on BiPAP, conversion to nasal cannula oxygen, observation, reassessment.  Additional imaging required.  Consultation with family members.  Consultation with general surgery.  No ICU beds  available at this facility.  Consultation with intensivist for admission or consultation, at facility in [place].  After These Interventions, the Patient was reevaluated and was found critically ill, with mild respiratory distress after stabilization.  Advanced airway not needed at this time.  Perforated viscus, abdomen, requiring surgical intervention.  Covid and flu test negative.  CRITICAL CARE-yes Performed by: Daleen Bo  Nursing Notes Reviewed/ Care Coordinated Applicable Imaging Reviewed Interpretation of Laboratory Data incorporated into ED treatment     Final Clinical Impression(s) / ED Diagnoses Final diagnoses:  COPD exacerbation ([place])  Hypoxia  Perforated abdominal viscus    Rx / DC Orders ED Discharge Orders    None       Daleen Bo, MD 01/17/20 2133

## 2020-01-17 NOTE — ED Notes (Signed)
EDP at bedside  

## 2020-01-17 NOTE — H&P (Deleted)
Reason for Consult: free air Referring Physician: [name] is an 59 y.o. male.  HPI: 59 yo male with diffuse abdominal pain for the last 2 weeks. He has not been able to eat due to the pain. The pain got definitely worse earlier today and he came to the ED. The pain is constant. It is worse on the right side. It does not radiate. It is worse with movement. The pain medications have helped some with the pain. He has some nausea but no vomiting. He had fevers earlier today. He smokes but hasn't smoked in over 1 week.  Past Medical History:  Diagnosis Date  . COPD (chronic obstructive pulmonary disease) ([place])   . 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 Hernia, epigastric   . Pancytopenia ([place])   . Seropositive rheumatoid arthritis ([place])   . Tobacco use disorder     History reviewed. No pertinent surgical history.  Family History  Problem Relation Age of Onset  . CAD Brother   . COPD Brother     Social History:  reports that he has been smoking cigarettes. He has been smoking about 1.00 pack per day. He has never used smokeless tobacco. He reports current drug use. Drug: Marijuana. He reports that he does not drink alcohol.  Allergies:  Allergies  Allergen Reactions  . Levaquin [Levofloxacin In D5w] Other (See Comments)    Chest pain  . Methotrexate Other (See Comments)    Chest pain    Medications: I have reviewed the patient's current medications.  Results for orders placed or performed during the hospital encounter of 01/17/20 (from the past 48 hour(s))  Respiratory Panel by RT PCR (Flu A&B, Covid) - Nasopharyngeal Swab     Status: None   Collection Time: 01/17/20  2:54 PM   Specimen: Nasopharyngeal Swab  Result Value Ref Range   SARS Coronavirus 2 by RT PCR NEGATIVE NEGATIVE    Comment: (NOTE) SARS-CoV-2 target nucleic acids are NOT DETECTED.  The SARS-CoV-2 RNA is generally detectable in upper respiratoy specimens during the acute phase of infection. The  lowest concentration of SARS-CoV-2 viral copies this assay can detect is 131 copies/mL. A negative result does not preclude SARS-Cov-2 infection and should not be used as the sole basis for treatment or other patient management decisions. A negative result may occur with  improper specimen collection/handling, submission of specimen other than nasopharyngeal swab, presence of viral mutation(s) within the areas targeted by this assay, and inadequate number of viral copies (<131 copies/mL). A negative result must be combined with clinical observations, patient history, and epidemiological information. The expected result is Negative.  Fact Sheet for Patients:  PinkCheek.be  Fact Sheet for Healthcare Providers:  GravelBags.it  This test is no t yet approved or cleared by the [place] FDA and  has been authorized for detection and/or diagnosis of SARS-CoV-2 by FDA under an Emergency Use Authorization (EUA). This EUA will remain  in effect (meaning this test can be used) for the duration of the COVID-19 declaration under Section 564(b)(1) of the Act, 21 U.S.C. section 360bbb-3(b)(1), unless the authorization is terminated or revoked sooner.     Influenza A by PCR NEGATIVE NEGATIVE   Influenza B by PCR NEGATIVE NEGATIVE    Comment: (NOTE) The Xpert Xpress SARS-CoV-2/FLU/RSV assay is intended as an aid in  the diagnosis of influenza from Nasopharyngeal swab specimens and  should not be used as a sole basis for treatment. Nasal washings and  aspirates  are unacceptable for Xpert Xpress SARS-CoV-2/FLU/RSV  testing.  Fact Sheet for Patients: PinkCheek.be  Fact Sheet for Healthcare Providers: GravelBags.it  This test is not yet approved or cleared by the [place] FDA and  has been authorized for detection and/or diagnosis of SARS-CoV-2 by  FDA under an Emergency  Use Authorization (EUA). This EUA will remain  in effect (meaning this test can be used) for the duration of the  Covid-19 declaration under Section 564(b)(1) of the Act, 21  U.S.C. section 360bbb-3(b)(1), unless the authorization is  terminated or revoked. Performed at [facility], [address], [place], [place] [address]   Lactic acid, plasma     Status: Abnormal   Collection Time: 01/17/20  2:54 PM  Result Value Ref Range   Lactic Acid, Venous 2.1 (HH) 0.5 - 1.9 mmol/L    Comment: CRITICAL RESULT CALLED TO, READ BACK BY AND VERIFIED WITH: GOINS,D ON 01/17/20 AT 1600 BY LOY,C Performed at [facility], [address] [place], [place] [address]   Blood Culture (routine x 2)     Status: None (Preliminary result)   Collection Time: 01/17/20  2:54 PM   Specimen: Left Antecubital; Blood  Result Value Ref Range   Specimen Description LEFT ANTECUBITAL    Special Requests      BOTTLES DRAWN AEROBIC AND ANAEROBIC Blood Culture adequate volume Performed at [facility], [address] [place], [place] [address]    Culture PENDING    Report Status PENDING   CBC WITH DIFFERENTIAL     Status: Abnormal   Collection Time: 01/17/20  2:54 PM  Result Value Ref Range   WBC 9.4 4.0 - 10.5 K/uL    Comment: WHITE COUNT CONFIRMED ON SMEAR   RBC 6.75 (H) 4.22 - 5.81 MIL/uL   Hemoglobin 19.1 (H) 13.0 - 17.0 g/dL   HCT 60.0 (H) 39 - 52 %   MCV 88.9 80.0 - 100.0 fL   MCH 28.3 26.0 - 34.0 pg   MCHC 31.8 30.0 - 36.0 g/dL   RDW 18.4 (H) 11.5 - 15.5 %   Platelets 346 150 - 400 K/uL   nRBC 1.0 (H) 0.0 - 0.2 %   Neutrophils Relative % 87 %   Neutro Abs 8.2 (H) 1.7 - 7.7 K/uL   Lymphocytes Relative 4 %   Lymphs Abs 0.4 (L) 0.7 - 4.0 K/uL   Monocytes Relative 6 %   Monocytes Absolute 0.6 0.1 - 1.0 K/uL   Eosinophils Relative 1 %   Eosinophils Absolute 0.1 0.0 - 0.5 K/uL   Basophils Relative 0 %   Basophils Absolute 0.0 0.0 - 0.1 K/uL   Immature Granulocytes 2 %   Abs Immature Granulocytes 0.14  (H) 0.00 - 0.07 K/uL    Comment: Performed at [facility], [address], [place], [place] [address]  Comprehensive metabolic panel     Status: Abnormal   Collection Time: 01/17/20  2:54 PM  Result Value Ref Range   Sodium 134 (L) 135 - 145 mmol/L   Potassium 4.3 3.5 - 5.1 mmol/L   Chloride 93 (L) 98 - 111 mmol/L   CO2 30 22 - 32 mmol/L   Glucose, Bld 120 (H) 70 - 99 mg/dL    Comment: Glucose reference range applies only to samples taken after fasting for at least 8 hours.   BUN 20 6 - 20 mg/dL   Creatinine, Ser 0.78 0.61 - 1.24 mg/dL   Calcium 8.1 (L) 8.9 - 10.3 mg/dL   Total Protein  8.1 6.5 - 8.1 g/dL   Albumin 2.5 (L) 3.5 - 5.0 g/dL   AST 22 15 - 41 U/L   ALT 9 0 - 44 U/L   Alkaline Phosphatase 94 38 - 126 U/L   Total Bilirubin 1.6 (H) 0.3 - 1.2 mg/dL   GFR, Estimated >60 >60 mL/min   Anion gap 11 5 - 15    Comment: Performed at [facility], [address], [place], [place] [address]  D-dimer, quantitative     Status: Abnormal   Collection Time: 01/17/20  2:54 PM  Result Value Ref Range   D-Dimer, Quant 3.69 (H) 0.00 - 0.50 ug/mL-FEU    Comment: (NOTE) At the manufacturer cut-off value of 0.5 g/mL FEU, this assay has a negative predictive value of 95-100%.This assay is intended for use in conjunction with a clinical pretest probability (PTP) assessment model to exclude pulmonary embolism (PE) and deep venous thrombosis (DVT) in outpatients suspected of PE or DVT. Results should be correlated with clinical presentation. Performed at [facility], [address] [place], [place] [address]   Procalcitonin     Status: None   Collection Time: 01/17/20  2:54 PM  Result Value Ref Range   Procalcitonin 0.19 ng/mL    Comment:        Interpretation: PCT (Procalcitonin) <= 0.5 ng/mL: Systemic infection (sepsis) is not likely. Local bacterial infection is possible. (NOTE)       Sepsis PCT Algorithm           Lower Respiratory Tract                                       Infection PCT Algorithm    ----------------------------     ----------------------------         PCT < 0.25 ng/mL                PCT < 0.10 ng/mL          Strongly encourage             Strongly discourage   discontinuation of antibiotics    initiation of antibiotics    ----------------------------     -----------------------------       PCT 0.25 - 0.50 ng/mL            PCT 0.10 - 0.25 ng/mL               OR       >80% decrease in PCT            Discourage initiation of                                            antibiotics      Encourage discontinuation           of antibiotics    ----------------------------     -----------------------------         PCT >= 0.50 ng/mL              PCT 0.26 - 0.50 ng/mL               AND        <80% decrease in PCT             Encourage initiation of  antibiotics       Encourage continuation           of antibiotics    ----------------------------     -----------------------------        PCT >= 0.50 ng/mL                  PCT > 0.50 ng/mL               AND         increase in PCT                  Strongly encourage                                      initiation of antibiotics    Strongly encourage escalation           of antibiotics                                     -----------------------------                                           PCT <= 0.25 ng/mL                                                 OR                                        > 80% decrease in PCT                                      Discontinue / Do not initiate                                             antibiotics  Performed at [facility], [address], [place], [place] [address]   Lactate dehydrogenase     Status: None   Collection Time: 01/17/20  2:54 PM  Result Value Ref Range   LDH 170 98 - 192 U/L    Comment: Performed at [facility], [address] [place], [place] [address]  Ferritin     Status: Abnormal    Collection Time: 01/17/20  2:54 PM  Result Value Ref Range   Ferritin 376 (H) 24 - 336 ng/mL    Comment: Performed at [facility], [address], [place], [place] [address]  Triglycerides     Status: None   Collection Time: 01/17/20  2:54 PM  Result Value Ref Range   Triglycerides 142 <150 mg/dL    Comment: Performed at [facility], [address], [place], [place] [address]  Fibrinogen     Status: Abnormal   Collection Time: 01/17/20  2:54 PM  Result Value Ref Range   Fibrinogen 708 (H)  210 - 475 mg/dL    Comment: Performed at [facility], [address], [place], [place] [address]  C-reactive protein     Status: Abnormal   Collection Time: 01/17/20  2:54 PM  Result Value Ref Range   CRP 20.2 (H) <1.0 mg/dL    Comment: Performed at [facility], [address], [place], [place] [address]  Troponin I (High Sensitivity)     Status: Abnormal   Collection Time: 01/17/20  2:54 PM  Result Value Ref Range   Troponin I (High Sensitivity) 52 (H) <18 ng/L    Comment: (NOTE) Elevated high sensitivity troponin I (hsTnI) values and significant  changes across serial measurements may suggest ACS but many other  chronic and acute conditions are known to elevate hsTnI results.  Refer to the Links section for chest pain algorithms and additional  guidance. Performed at [facility], [address], [place], [place] [address]   Blood Culture (routine x 2)     Status: None (Preliminary result)   Collection Time: 01/17/20  2:59 PM   Specimen: BLOOD LEFT WRIST  Result Value Ref Range   Specimen Description BLOOD LEFT WRIST    Special Requests      BOTTLES DRAWN AEROBIC AND ANAEROBIC Blood Culture adequate volume Performed at [facility], [address] [place], [place] [address]    Culture PENDING    Report Status PENDING   Blood gas, venous     Status: Abnormal   Collection Time: 01/17/20  3:40 PM  Result Value Ref Range   FIO2 97.00    pH, Ven 7.377 7.25 - 7.43   pCO2, Ven 58.7 44 - 60  mmHg   pO2, Ven 123.0 (H) 32 - 45 mmHg   Bicarbonate 30.0 (H) 20.0 - 28.0 mmol/L   Acid-Base Excess 8.0 (H) 0.0 - 2.0 mmol/L   O2 Saturation 97.6 %   Patient temperature 39.0     Comment: Performed at [facility], [address], [place], [place] [address]  Lactic acid, plasma     Status: None   Collection Time: 01/17/20  4:47 PM  Result Value Ref Range   Lactic Acid, Venous 1.6 0.5 - 1.9 mmol/L    Comment: Performed at [facility], [address], [place], [place] [address]  Troponin I (High Sensitivity)     Status: Abnormal   Collection Time: 01/17/20  5:40 PM  Result Value Ref Range   Troponin I (High Sensitivity) 59 (H) <18 ng/L    Comment: (NOTE) Elevated high sensitivity troponin I (hsTnI) values and significant  changes across serial measurements may suggest ACS but many other  chronic and acute conditions are known to elevate hsTnI results.  Refer to the "Links" section for chest pain algorithms and additional  guidance. Performed at [facility], [address], [place], [place] [address]   MRSA PCR Screening     Status: None   Collection Time: 01/17/20  6:35 PM   Specimen: Nasal Mucosa; Nasopharyngeal  Result Value Ref Range   MRSA by PCR NEGATIVE NEGATIVE    Comment:        The GeneXpert MRSA Assay (FDA approved for NASAL specimens only), is one component of a comprehensive MRSA colonization surveillance program. It is not intended to diagnose MRSA infection nor to guide or monitor treatment for MRSA infections. Performed at [facility], [address] [place],  [address]     CT Angio Chest PE W/Cm &/Or Wo Cm  Result Date: 01/17/2020 CLINICAL DATA:  Hypoxia and elevated D-dimer EXAM: CT ANGIOGRAPHY CHEST WITH CONTRAST TECHNIQUE: Multidetector CT imaging of the chest was performed using the standard protocol during bolus administration of intravenous contrast. Multiplanar CT image reconstructions and MIPs were obtained to evaluate the vascular anatomy.  CONTRAST:  164mL OMNIPAQUE IOHEXOL 350 MG/ML SOLN COMPARISON:  Chest x-ray from earlier in the same day, CT from 11/14/2015 FINDINGS: Cardiovascular: Thoracic aorta demonstrates atherosclerotic calcification without aneurysmal dilatation or dissection. No cardiac enlargement is noted. Coronary calcifications are seen. Pulmonary artery demonstrates a normal branching pattern bilaterally. No filling defect to suggest pulmonary embolism is noted. Mediastinum/Nodes: Thoracic inlet is within normal limits. The esophagus is within normal limits. Scattered small hilar and mediastinal adenopathy is noted Lungs/Pleura: Thelungs are well aerated bilaterally and demonstrate diffuse reticulonodular and tree-in-bud changes consistent with multifocal inflammatory change. No focal confluent infiltrate is noted. Mild bronchial thickening is seen as well. No sizable parenchymal nodule is noted. No effusion or pneumothorax is seen. Diffuse emphysematous changes are noted. Upper Abdomen: Visualized upper abdomen demonstrates evidence of free air as well as a large ventral hernia with loops of transverse colon within. This does not appear to be obstructive in nature although no CT of the abdomen and pelvis is recommended. The free air is somewhat centered around the stomach which may be related to a perforated gastric ulcer. Again dedicated abdominal CT is recommended. Musculoskeletal: Degenerative changes of the thoracic spine are noted. Old rib fractures with healing are seen on the right. Review of the MIP images confirms the above findings. IMPRESSION: Diffuse reticulonodular and tree-in-bud changes consistent with acute atypical pneumonia. No focal confluent infiltrate is noted. No evidence of pulmonary emboli. Free air within the abdomen with some thickening of the gastric wall and surrounding inflammatory change suspicious for perforated gastric ulcer. Dedicated CT of the abdomen and pelvis is recommended. Large ventral hernia  containing transverse colon although no definitive obstructive changes are seen. This would also be well evaluated on a CT of the abdomen and pelvis. Aortic Atherosclerosis (ICD10-I70.0) and Emphysema (ICD10-J43.9). Critical Value/emergent results were called by telephone at the time of interpretation on 01/17/2020 at 5:54 pm to Dr. Daleen Bo , who verbally acknowledged these results. Electronically Signed   By: Inez Catalina M.D.   On: 01/17/2020 17:55   CT Abdomen Pelvis W Contrast  Result Date: 01/17/2020 CLINICAL DATA:  Peritonitis.  Possible pneumoperitoneum. EXAM: CT ABDOMEN AND PELVIS WITH CONTRAST TECHNIQUE: Multidetector CT imaging of the abdomen and pelvis was performed using the standard protocol following bolus administration of intravenous contrast. CONTRAST:  65mL OMNIPAQUE IOHEXOL 300 MG/ML  SOLN COMPARISON:  CT of same day.  Aug 19, 2019. FINDINGS: Lower chest: Stable reticular opacity seen in visualized lung bases consistent with atypical inflammation. Hepatobiliary: Minimal cholelithiasis is noted. No biliary dilatation is noted. Liver is unremarkable. Pancreas: Unremarkable. No pancreatic ductal dilatation or surrounding inflammatory changes. Spleen: Status post splenectomy. Adrenals/Urinary Tract: Adrenal glands are unremarkable. Kidneys are normal, without renal calculi, focal lesion, or hydronephrosis. Bladder is unremarkable. Stomach/Bowel: The appendix appears normal. Wall thickening and surrounding inflammatory changes are seen involving the distal stomach concerning for peptic ulcer disease. Large epigastric ventral hernia is again noted which contains a loop of transverse colon; there is noted dilatation of this portion of the transverse colon. Free air is noted in the epigastric region in the right subdiaphragmatic space and epigastric region as well as in within the hernia, consistent with rupture of hollow viscus. Vascular/Lymphatic: Atherosclerosis of abdominal aorta is  noted.  Stable 3.1 cm infrarenal abdominal aortic aneurysm is noted. No adenopathy is noted. Reproductive: Prostate is unremarkable. Other: Moderate size fat containing right inguinal hernia is noted. No ascites is noted. Musculoskeletal: No acute or significant osseous findings. IMPRESSION: 1. Free air is noted in the epigastric region, in the right subdiaphragmatic space and within the hernia, consistent with rupture of hollow viscus. Wall thickening and surrounding inflammatory changes are seen involving the distal stomach concerning for peptic ulcer disease. Critical Value/emergent results were called by telephone at the time of interpretation on 01/17/2020 at 7:57 pm to provider [facility] , who verbally acknowledged these results. 2. Large epigastric ventral hernia is again noted which contains a loop of transverse colon; there is noted dilatation of this portion of the transverse colon. 3. Minimal cholelithiasis is noted. 4. Stable 3.1 cm infrarenal abdominal aortic aneurysm. Recommend follow-up ultrasound every 3 years. This recommendation follows ACR consensus guidelines: White Paper of the ACR Incidental Findings Committee II on Vascular Findings. J Am Coll Radiol 2013; 10:789-794. 5. Moderate size fat containing right inguinal hernia. 6. Stable reticular opacity is seen in visualized lung bases consistent with atypical inflammation. 7. Aortic atherosclerosis. Aortic Atherosclerosis (ICD10-I70.0). Electronically Signed   By: Marijo Conception M.D.   On: 01/17/2020 19:58   DG Chest Port 1 View  Result Date: 01/17/2020 CLINICAL DATA:  Shortness of breath. EXAM: PORTABLE CHEST 1 VIEW COMPARISON:  November 30, 2019. FINDINGS: The heart size and mediastinal contours are within normal limits. No pneumothorax or pleural effusion is noted. Stable reticular densities are noted throughout both lungs most consistent with chronic interstitial scarring or fibrosis, but acute superimposed inflammation or edema cannot be  excluded. The visualized skeletal structures are unremarkable. IMPRESSION: Stable reticular densities are noted throughout both lungs most consistent with chronic interstitial scarring or fibrosis, but acute superimposed inflammation or edema cannot be excluded. Electronically Signed   By: Marijo Conception M.D.   On: 01/17/2020 15:51    ROS  PE Blood pressure 107/68, pulse 68, temperature 99.8 F (37.7 C), temperature source Axillary, resp. rate (!) 29, weight 63.8 kg, SpO2 96 %. Constitutional: NAD; conversant; no deformities Eyes: Moist conjunctiva; no lid lag; anicteric; PERRL Neck: Trachea midline; no thyromegaly Lungs: labored respiratory effort; no tactile fremitus CV: RRR; no palpable thrills; no pitting edema GI: Abd tenderness with guarding right abdomen, midline hernia with some purple discoloration of the skin; no palpable hepatosplenomegaly MSK: unable to assess gait; no clubbing/cyanosis Psychiatric: Appropriate affect; alert and oriented x3 Lymphatic: No palpable cervical or axillary lymphadenopathy   Assessment/Plan: 59 yo male with guarding, fevers, labored breathing found to have large amount of pneumoperitoneum on work up concerning for perforated ulcer. He has a long history of tobacco abuse and COPD requiring BIPAP earlier in the night. -broad abx -OR for exploratory laparotomy -ICU admission post op  Marcus [name] 01/17/2020, 11:19 PM

## 2020-01-17 NOTE — Progress Notes (Signed)
Pharmacy Antibiotic Note  Marcus [name] is a 59 y.o. male admitted on 01/17/2020 with pneumonia.  Pharmacy has been consulted for Vancomycin dosing.  Plan: Vancomycin 1500 mg IV x 1 dose. Vancomycin 750 mg IV every 12 hours. Expected AUC 445.  Zosyn 3.375 g IV x 1 dose. F/U additional doses/consult. Monitor labs, c/s, and vanco level as indicated.  Weight: 63.8 kg (140 lb 10.5 oz)  Temp (24hrs), Avg:102.2 F (39 C), Min:102.2 F (39 C), Max:102.2 F (39 C)  Recent Labs  Lab 01/17/20 1454 01/17/20 1647  WBC 9.4  --   CREATININE 0.78  --   LATICACIDVEN 2.1* 1.6    Estimated Creatinine Clearance: 90.8 mL/min (by C-G formula based on SCr of 0.78 mg/dL).    Allergies  Allergen Reactions  . Levaquin [Levofloxacin In D5w] Other (See Comments)    Chest pain  . Methotrexate Other (See Comments)    Chest pain    Antimicrobials this admission: Vanco 10/12 >>  Zosyn 10/12 >>   Dose adjustments this admission: N/A  Microbiology results: 10/12 BCx: pending  10/12 MRSA PCR: pending  Thank you for allowing pharmacy to be a part of this patient's care.  Ramond Craver 01/17/2020 6:34 PM

## 2020-01-17 NOTE — ED Notes (Signed)
Date and time results received: 01/17/20 1600 (use smartphrase ".now" to insert current time)  Test: Lactic acid  Critical Value: 2.1  Name of Provider Notified: Eulis Foster  Orders Received? Or Actions Taken?:

## 2020-01-17 NOTE — Consult Note (Signed)
Was informed by Dr. Eulis Foster of the emergency room about the emergent CT findings of pneumoperitoneum consistent with a perforated viscus.  This was found after patient had been treated for respiratory distress.  I have been told that we have no ICU beds and this patient most likely will require intensive care unit admission perioperatively.  He will contact [facility] for transfer and higher level of care.

## 2020-01-17 NOTE — ED Notes (Signed)
EDP and respiratory called to come assess pt.

## 2020-01-17 NOTE — ED Notes (Signed)
Respiratory at bedside. Pt placed on Bipap at this time.

## 2020-01-17 NOTE — Consult Note (Signed)
Reason for Consult: free air Referring Physician: Eldin Bonsell is an 59 y.o. male.  HPI: 59 yo male with diffuse abdominal pain for the last 2 weeks. He has not been able to eat due to the pain. The pain got definitely worse earlier today and he came to the ED. The pain is constant. It is worse on the right side. It does not radiate. It is worse with movement. The pain medications have helped some with the pain. He has some nausea but no vomiting. He had fevers earlier today. He smokes but hasn't smoked in over 1 week.      Past Medical History:  Diagnosis Date  . COPD (chronic obstructive pulmonary disease) ([place])   . 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 Hernia, epigastric   . Pancytopenia ([place])   . Seropositive rheumatoid arthritis ([place])   . Tobacco use disorder     History reviewed. No pertinent surgical history.       Family History  Problem Relation Age of Onset  . CAD Brother   . COPD Brother     Social History:  reports that he has been smoking cigarettes. He has been smoking about 1.00 pack per day. He has never used smokeless tobacco. He reports current drug use. Drug: Marijuana. He reports that he does not drink alcohol.  Allergies:       Allergies  Allergen Reactions  . Levaquin [Levofloxacin In D5w] Other (See Comments)    Chest pain  . Methotrexate Other (See Comments)    Chest pain    Medications: I have reviewed the patient's current medications.  Lab Results Last 48 Hours        Results for orders placed or performed during the hospital encounter of 01/17/20 (from the past 48 hour(s))  Respiratory Panel by RT PCR (Flu A&B, Covid) - Nasopharyngeal Swab     Status: None   Collection Time: 01/17/20  2:54 PM   Specimen: Nasopharyngeal Swab  Result Value Ref Range   SARS Coronavirus 2 by RT PCR NEGATIVE NEGATIVE    Comment: (NOTE) SARS-CoV-2 target nucleic acids are NOT DETECTED.  The SARS-CoV-2 RNA is generally  detectable in upper respiratoy specimens during the acute phase of infection. The lowest concentration of SARS-CoV-2 viral copies this assay can detect is 131 copies/mL. A negative result does not preclude SARS-Cov-2 infection and should not be used as the sole basis for treatment or other patient management decisions. A negative result may occur with  improper specimen collection/handling, submission of specimen other than nasopharyngeal swab, presence of viral mutation(s) within the areas targeted by this assay, and inadequate number of viral copies (<131 copies/mL). A negative result must be combined with clinical observations, patient history, and epidemiological information. The expected result is Negative.  Fact Sheet for Patients:  PinkCheek.be  Fact Sheet for Healthcare Providers:  GravelBags.it  This test is no t yet approved or cleared by the [place] FDA and  has been authorized for detection and/or diagnosis of SARS-CoV-2 by FDA under an Emergency Use Authorization (EUA). This EUA will remain  in effect (meaning this test can be used) for the duration of the COVID-19 declaration under Section 564(b)(1) of the Act, 21 U.S.C. section 360bbb-3(b)(1), unless the authorization is terminated or revoked sooner.     Influenza A by PCR NEGATIVE NEGATIVE   Influenza B by PCR NEGATIVE NEGATIVE    Comment: (NOTE) The Xpert Xpress SARS-CoV-2/FLU/RSV assay is intended as an aid in  the diagnosis of influenza from Nasopharyngeal swab specimens and  should not be used as a sole basis for treatment. Nasal washings and  aspirates are unacceptable for Xpert Xpress SARS-CoV-2/FLU/RSV  testing.  Fact Sheet for Patients: PinkCheek.be  Fact Sheet for Healthcare Providers: GravelBags.it  This test is not yet approved or cleared by the [place] FDA and   has been authorized for detection and/or diagnosis of SARS-CoV-2 by  FDA under an Emergency Use Authorization (EUA). This EUA will remain  in effect (meaning this test can be used) for the duration of the  Covid-19 declaration under Section 564(b)(1) of the Act, 21  U.S.C. section 360bbb-3(b)(1), unless the authorization is  terminated or revoked. Performed at [facility], [address] [place], [place] [address]   Lactic acid, plasma     Status: Abnormal   Collection Time: 01/17/20  2:54 PM  Result Value Ref Range   Lactic Acid, Venous 2.1 (HH) 0.5 - 1.9 mmol/L    Comment: CRITICAL RESULT CALLED TO, READ BACK BY AND VERIFIED WITH: GOINS,D ON 01/17/20 AT 1600 BY LOY,C Performed at [facility], [address], [place], [place] [address]   Blood Culture (routine x 2)     Status: None (Preliminary result)   Collection Time: 01/17/20  2:54 PM   Specimen: Left Antecubital; Blood  Result Value Ref Range   Specimen Description LEFT ANTECUBITAL    Special Requests      BOTTLES DRAWN AEROBIC AND ANAEROBIC Blood Culture adequate volume Performed at [facility], [address], [place], [place] [address]    Culture PENDING    Report Status PENDING   CBC WITH DIFFERENTIAL     Status: Abnormal   Collection Time: 01/17/20  2:54 PM  Result Value Ref Range   WBC 9.4 4.0 - 10.5 K/uL    Comment: WHITE COUNT CONFIRMED ON SMEAR   RBC 6.75 (H) 4.22 - 5.81 MIL/uL   Hemoglobin 19.1 (H) 13.0 - 17.0 g/dL   HCT 60.0 (H) 39 - 52 %   MCV 88.9 80.0 - 100.0 fL   MCH 28.3 26.0 - 34.0 pg   MCHC 31.8 30.0 - 36.0 g/dL   RDW 18.4 (H) 11.5 - 15.5 %   Platelets 346 150 - 400 K/uL   nRBC 1.0 (H) 0.0 - 0.2 %   Neutrophils Relative % 87 %   Neutro Abs 8.2 (H) 1.7 - 7.7 K/uL   Lymphocytes Relative 4 %   Lymphs Abs 0.4 (L) 0.7 - 4.0 K/uL   Monocytes Relative 6 %   Monocytes Absolute 0.6 0.1 - 1.0 K/uL   Eosinophils Relative 1 %   Eosinophils Absolute 0.1 0.0 - 0.5  K/uL   Basophils Relative 0 %   Basophils Absolute 0.0 0.0 - 0.1 K/uL   Immature Granulocytes 2 %   Abs Immature Granulocytes 0.14 (H) 0.00 - 0.07 K/uL    Comment: Performed at [facility], [address] [place], [place] [address]  Comprehensive metabolic panel     Status: Abnormal   Collection Time: 01/17/20  2:54 PM  Result Value Ref Range   Sodium 134 (L) 135 - 145 mmol/L   Potassium 4.3 3.5 - 5.1 mmol/L   Chloride 93 (L) 98 - 111 mmol/L   CO2 30 22 - 32 mmol/L   Glucose, Bld 120 (H) 70 - 99 mg/dL    Comment: Glucose reference range applies only to samples taken after fasting for at least 8 hours.   BUN 20 6 -  20 mg/dL   Creatinine, Ser 0.78 0.61 - 1.24 mg/dL   Calcium 8.1 (L) 8.9 - 10.3 mg/dL   Total Protein 8.1 6.5 - 8.1 g/dL   Albumin 2.5 (L) 3.5 - 5.0 g/dL   AST 22 15 - 41 U/L   ALT 9 0 - 44 U/L   Alkaline Phosphatase 94 38 - 126 U/L   Total Bilirubin 1.6 (H) 0.3 - 1.2 mg/dL   GFR, Estimated >60 >60 mL/min   Anion gap 11 5 - 15    Comment: Performed at [facility], [address] [place], [place] [address]  D-dimer, quantitative     Status: Abnormal   Collection Time: 01/17/20  2:54 PM  Result Value Ref Range   D-Dimer, Quant 3.69 (H) 0.00 - 0.50 ug/mL-FEU    Comment: (NOTE) At the manufacturer cut-off value of 0.5 g/mL FEU, this assay has a negative predictive value of 95-100%.This assay is intended for use in conjunction with a clinical pretest probability (PTP) assessment model to exclude pulmonary embolism (PE) and deep venous thrombosis (DVT) in outpatients suspected of PE or DVT. Results should be correlated with clinical presentation. Performed at [facility], [address] [place], [place] [address]   Procalcitonin     Status: None   Collection Time: 01/17/20  2:54 PM  Result Value Ref Range   Procalcitonin 0.19 ng/mL    Comment:        Interpretation: PCT (Procalcitonin) <= 0.5 ng/mL: Systemic infection (sepsis)  is not likely. Local bacterial infection is possible. (NOTE)       Sepsis PCT Algorithm           Lower Respiratory Tract                                      Infection PCT Algorithm    ----------------------------     ----------------------------         PCT < 0.25 ng/mL                PCT < 0.10 ng/mL          Strongly encourage             Strongly discourage   discontinuation of antibiotics    initiation of antibiotics    ----------------------------     -----------------------------       PCT 0.25 - 0.50 ng/mL            PCT 0.10 - 0.25 ng/mL               OR       >80% decrease in PCT            Discourage initiation of                                            antibiotics      Encourage discontinuation           of antibiotics    ----------------------------     -----------------------------         PCT >= 0.50 ng/mL              PCT 0.26 - 0.50 ng/mL               AND        <  80% decrease in PCT             Encourage initiation of                                             antibiotics       Encourage continuation           of antibiotics    ----------------------------     -----------------------------        PCT >= 0.50 ng/mL                  PCT > 0.50 ng/mL               AND         increase in PCT                  Strongly encourage                                      initiation of antibiotics    Strongly encourage escalation           of antibiotics                                     -----------------------------                                           PCT <= 0.25 ng/mL                                                 OR                                        > 80% decrease in PCT                                      Discontinue / Do not initiate                                             antibiotics  Performed at [facility], [address] [place], [place] [address]   Lactate dehydrogenase     Status: None   Collection Time: 01/17/20  2:54 PM   Result Value Ref Range   LDH 170 98 - 192 U/L    Comment: Performed at [facility], [address], [place], [place] [address]  Ferritin     Status: Abnormal   Collection Time: 01/17/20  2:54 PM  Result Value Ref Range   Ferritin 376 (H) 24 - 336 ng/mL    Comment: Performed at [facility], [address], [place], [place] [address]  Triglycerides  Status: None   Collection Time: 01/17/20  2:54 PM  Result Value Ref Range   Triglycerides 142 <150 mg/dL    Comment: Performed at [facility], [address], [place], [place] [address]  Fibrinogen     Status: Abnormal   Collection Time: 01/17/20  2:54 PM  Result Value Ref Range   Fibrinogen 708 (H) 210 - 475 mg/dL    Comment: Performed at [facility], [address], [place], [place] [address]  C-reactive protein     Status: Abnormal   Collection Time: 01/17/20  2:54 PM  Result Value Ref Range   CRP 20.2 (H) <1.0 mg/dL    Comment: Performed at [facility], [address] [place], [place] [address]  Troponin I (High Sensitivity)     Status: Abnormal   Collection Time: 01/17/20  2:54 PM  Result Value Ref Range   Troponin I (High Sensitivity) 52 (H) <18 ng/L    Comment: (NOTE) Elevated high sensitivity troponin I (hsTnI) values and significant  changes across serial measurements may suggest ACS but many other  chronic and acute conditions are known to elevate hsTnI results.  Refer to the Links section for chest pain algorithms and additional  guidance. Performed at [facility], [address] [place], [place] [address]   Blood Culture (routine x 2)     Status: None (Preliminary result)   Collection Time: 01/17/20  2:59 PM   Specimen: BLOOD LEFT WRIST  Result Value Ref Range   Specimen Description BLOOD LEFT WRIST    Special Requests      BOTTLES DRAWN AEROBIC AND ANAEROBIC Blood Culture adequate volume Performed at [facility], [address] [place], [place] [address]    Culture PENDING     Report Status PENDING   Blood gas, venous     Status: Abnormal   Collection Time: 01/17/20  3:40 PM  Result Value Ref Range   FIO2 97.00    pH, Ven 7.377 7.25 - 7.43   pCO2, Ven 58.7 44 - 60 mmHg   pO2, Ven 123.0 (H) 32 - 45 mmHg   Bicarbonate 30.0 (H) 20.0 - 28.0 mmol/L   Acid-Base Excess 8.0 (H) 0.0 - 2.0 mmol/L   O2 Saturation 97.6 %   Patient temperature 39.0     Comment: Performed at [facility], [address], [place], [place] [address]  Lactic acid, plasma     Status: None   Collection Time: 01/17/20  4:47 PM  Result Value Ref Range   Lactic Acid, Venous 1.6 0.5 - 1.9 mmol/L    Comment: Performed at [facility], [address] [place], [place] [address]  Troponin I (High Sensitivity)     Status: Abnormal   Collection Time: 01/17/20  5:40 PM  Result Value Ref Range   Troponin I (High Sensitivity) 59 (H) <18 ng/L    Comment: (NOTE) Elevated high sensitivity troponin I (hsTnI) values and significant  changes across serial measurements may suggest ACS but many other  chronic and acute conditions are known to elevate hsTnI results.  Refer to the "Links" section for chest pain algorithms and additional  guidance. Performed at [facility], [address], [place],  [address]   MRSA PCR Screening     Status: None   Collection Time: 01/17/20  6:35 PM   Specimen: Nasal Mucosa; Nasopharyngeal  Result Value Ref Range   MRSA by PCR NEGATIVE NEGATIVE    Comment:        The GeneXpert MRSA Assay (  FDA approved for NASAL specimens only), is one component of a comprehensive MRSA colonization surveillance program. It is not intended to diagnose MRSA infection nor to guide or monitor treatment for MRSA infections. Performed at [facility], [address], [place], [place] [address]        Imaging Results (Last 48 hours)  CT Angio Chest PE W/Cm &/Or Wo Cm  Result Date: 01/17/2020 CLINICAL DATA:  Hypoxia and elevated D-dimer EXAM: CT  ANGIOGRAPHY CHEST WITH CONTRAST TECHNIQUE: Multidetector CT imaging of the chest was performed using the standard protocol during bolus administration of intravenous contrast. Multiplanar CT image reconstructions and MIPs were obtained to evaluate the vascular anatomy. CONTRAST:  153mL OMNIPAQUE IOHEXOL 350 MG/ML SOLN COMPARISON:  Chest x-ray from earlier in the same day, CT from 11/14/2015 FINDINGS: Cardiovascular: Thoracic aorta demonstrates atherosclerotic calcification without aneurysmal dilatation or dissection. No cardiac enlargement is noted. Coronary calcifications are seen. Pulmonary artery demonstrates a normal branching pattern bilaterally. No filling defect to suggest pulmonary embolism is noted. Mediastinum/Nodes: Thoracic inlet is within normal limits. The esophagus is within normal limits. Scattered small hilar and mediastinal adenopathy is noted Lungs/Pleura: Thelungs are well aerated bilaterally and demonstrate diffuse reticulonodular and tree-in-bud changes consistent with multifocal inflammatory change. No focal confluent infiltrate is noted. Mild bronchial thickening is seen as well. No sizable parenchymal nodule is noted. No effusion or pneumothorax is seen. Diffuse emphysematous changes are noted. Upper Abdomen: Visualized upper abdomen demonstrates evidence of free air as well as a large ventral hernia with loops of transverse colon within. This does not appear to be obstructive in nature although no CT of the abdomen and pelvis is recommended. The free air is somewhat centered around the stomach which may be related to a perforated gastric ulcer. Again dedicated abdominal CT is recommended. Musculoskeletal: Degenerative changes of the thoracic spine are noted. Old rib fractures with healing are seen on the right. Review of the MIP images confirms the above findings. IMPRESSION: Diffuse reticulonodular and tree-in-bud changes consistent with acute atypical pneumonia. No focal confluent  infiltrate is noted. No evidence of pulmonary emboli. Free air within the abdomen with some thickening of the gastric wall and surrounding inflammatory change suspicious for perforated gastric ulcer. Dedicated CT of the abdomen and pelvis is recommended. Large ventral hernia containing transverse colon although no definitive obstructive changes are seen. This would also be well evaluated on a CT of the abdomen and pelvis. Aortic Atherosclerosis (ICD10-I70.0) and Emphysema (ICD10-J43.9). Critical Value/emergent results were called by telephone at the time of interpretation on 01/17/2020 at 5:54 pm to Dr. Daleen Bo , who verbally acknowledged these results. Electronically Signed   By: Inez Catalina M.D.   On: 01/17/2020 17:55   CT Abdomen Pelvis W Contrast  Result Date: 01/17/2020 CLINICAL DATA:  Peritonitis.  Possible pneumoperitoneum. EXAM: CT ABDOMEN AND PELVIS WITH CONTRAST TECHNIQUE: Multidetector CT imaging of the abdomen and pelvis was performed using the standard protocol following bolus administration of intravenous contrast. CONTRAST:  99mL OMNIPAQUE IOHEXOL 300 MG/ML  SOLN COMPARISON:  CT of same day.  Aug 19, 2019. FINDINGS: Lower chest: Stable reticular opacity seen in visualized lung bases consistent with atypical inflammation. Hepatobiliary: Minimal cholelithiasis is noted. No biliary dilatation is noted. Liver is unremarkable. Pancreas: Unremarkable. No pancreatic ductal dilatation or surrounding inflammatory changes. Spleen: Status post splenectomy. Adrenals/Urinary Tract: Adrenal glands are unremarkable. Kidneys are normal, without renal calculi, focal lesion, or hydronephrosis. Bladder is unremarkable. Stomach/Bowel: The appendix appears normal. Wall thickening and surrounding inflammatory  changes are seen involving the distal stomach concerning for peptic ulcer disease. Large epigastric ventral hernia is again noted which contains a loop of transverse colon; there is noted dilatation of  this portion of the transverse colon. Free air is noted in the epigastric region in the right subdiaphragmatic space and epigastric region as well as in within the hernia, consistent with rupture of hollow viscus. Vascular/Lymphatic: Atherosclerosis of abdominal aorta is noted. Stable 3.1 cm infrarenal abdominal aortic aneurysm is noted. No adenopathy is noted. Reproductive: Prostate is unremarkable. Other: Moderate size fat containing right inguinal hernia is noted. No ascites is noted. Musculoskeletal: No acute or significant osseous findings. IMPRESSION: 1. Free air is noted in the epigastric region, in the right subdiaphragmatic space and within the hernia, consistent with rupture of hollow viscus. Wall thickening and surrounding inflammatory changes are seen involving the distal stomach concerning for peptic ulcer disease. Critical Value/emergent results were called by telephone at the time of interpretation on 01/17/2020 at 7:57 pm to provider [facility] , who verbally acknowledged these results. 2. Large epigastric ventral hernia is again noted which contains a loop of transverse colon; there is noted dilatation of this portion of the transverse colon. 3. Minimal cholelithiasis is noted. 4. Stable 3.1 cm infrarenal abdominal aortic aneurysm. Recommend follow-up ultrasound every 3 years. This recommendation follows ACR consensus guidelines: White Paper of the ACR Incidental Findings Committee II on Vascular Findings. J Am Coll Radiol 2013; 10:789-794. 5. Moderate size fat containing right inguinal hernia. 6. Stable reticular opacity is seen in visualized lung bases consistent with atypical inflammation. 7. Aortic atherosclerosis. Aortic Atherosclerosis (ICD10-I70.0). Electronically Signed   By: Marijo Conception M.D.   On: 01/17/2020 19:58   DG Chest Port 1 View  Result Date: 01/17/2020 CLINICAL DATA:  Shortness of breath. EXAM: PORTABLE CHEST 1 VIEW COMPARISON:  November 30, 2019. FINDINGS: The heart  size and mediastinal contours are within normal limits. No pneumothorax or pleural effusion is noted. Stable reticular densities are noted throughout both lungs most consistent with chronic interstitial scarring or fibrosis, but acute superimposed inflammation or edema cannot be excluded. The visualized skeletal structures are unremarkable. IMPRESSION: Stable reticular densities are noted throughout both lungs most consistent with chronic interstitial scarring or fibrosis, but acute superimposed inflammation or edema cannot be excluded. Electronically Signed   By: Marijo Conception M.D.   On: 01/17/2020 15:51     ROS Unable to complete due to acuity of disease  PE Blood pressure 107/68, pulse 68, temperature 99.8 F (37.7 C), temperature source Axillary, resp. rate (!) 29, weight 63.8 kg, SpO2 96 %. Constitutional: NAD; conversant; no deformities Eyes: Moist conjunctiva; no lid lag; anicteric; PERRL Neck: Trachea midline; no thyromegaly Lungs: labored respiratory effort; no tactile fremitus CV: RRR; no palpable thrills; no pitting edema GI: Abd tenderness with guarding right abdomen, midline hernia with some purple discoloration of the skin; no palpable hepatosplenomegaly MSK: unable to assess gait; no clubbing/cyanosis Psychiatric: Appropriate affect; alert and oriented x3 Lymphatic: No palpable cervical or axillary lymphadenopathy   Assessment/Plan: 59 yo male with guarding, fevers, labored breathing found to have large amount of pneumoperitoneum on work up concerning for perforated ulcer. He has a long history of tobacco abuse and COPD requiring BIPAP earlier in the night. -broad abx -OR for exploratory laparotomy -ICU admission post op  Arta Bruce Sigfredo Schreier

## 2020-01-18 ENCOUNTER — Encounter (HOSPITAL_COMMUNITY): Payer: Self-pay | Admitting: General Surgery

## 2020-01-18 ENCOUNTER — Inpatient Hospital Stay (HOSPITAL_COMMUNITY): Payer: Medicaid Other

## 2020-01-18 DIAGNOSIS — J9691 Respiratory failure, unspecified with hypoxia: Secondary | ICD-10-CM

## 2020-01-18 DIAGNOSIS — I2781 Cor pulmonale (chronic): Secondary | ICD-10-CM

## 2020-01-18 DIAGNOSIS — R931 Abnormal findings on diagnostic imaging of heart and coronary circulation: Secondary | ICD-10-CM

## 2020-01-18 DIAGNOSIS — K251 Acute gastric ulcer with perforation: Secondary | ICD-10-CM | POA: Diagnosis not present

## 2020-01-18 DIAGNOSIS — K255 Chronic or unspecified gastric ulcer with perforation: Secondary | ICD-10-CM

## 2020-01-18 DIAGNOSIS — R9431 Abnormal electrocardiogram [ECG] [EKG]: Secondary | ICD-10-CM | POA: Diagnosis not present

## 2020-01-18 LAB — BASIC METABOLIC PANEL
Anion gap: 6 (ref 5–15)
BUN: 22 mg/dL — ABNORMAL HIGH (ref 6–20)
CO2: 26 mmol/L (ref 22–32)
Calcium: 7.1 mg/dL — ABNORMAL LOW (ref 8.9–10.3)
Chloride: 102 mmol/L (ref 98–111)
Creatinine, Ser: 0.8 mg/dL (ref 0.61–1.24)
GFR, Estimated: >60 mL/min (ref >60)
Glucose, Bld: 164 mg/dL — ABNORMAL HIGH (ref 70–99)
Potassium: 4.3 mmol/L (ref 3.5–5.1)
Sodium: 134 mmol/L — ABNORMAL LOW (ref 135–145)

## 2020-01-18 LAB — ECHOCARDIOGRAM COMPLETE
Area-P 1/2: 1.7 cm2
Height: 70 in
S' Lateral: 3.4 cm
Weight: 2663.16 oz

## 2020-01-18 LAB — CBC
HCT: 53 % — ABNORMAL HIGH (ref 39.0–52.0)
Hemoglobin: 16.5 g/dL (ref 13.0–17.0)
MCH: 28.3 pg (ref 26.0–34.0)
MCHC: 31.1 g/dL (ref 30.0–36.0)
MCV: 90.8 fL (ref 80.0–100.0)
Platelets: 334 10*3/uL (ref 150–400)
RBC: 5.84 MIL/uL — ABNORMAL HIGH (ref 4.22–5.81)
RDW: 16.9 % — ABNORMAL HIGH (ref 11.5–15.5)
WBC: 18.1 10*3/uL — ABNORMAL HIGH (ref 4.0–10.5)
nRBC: 0.2 % (ref 0.0–0.2)

## 2020-01-18 LAB — POCT I-STAT 7, (LYTES, BLD GAS, ICA,H+H)
Acid-Base Excess: 1 mmol/L (ref 0.0–2.0)
Bicarbonate: 29.5 mmol/L — ABNORMAL HIGH (ref 20.0–28.0)
Calcium, Ion: 1.05 mmol/L — ABNORMAL LOW (ref 1.15–1.40)
HCT: 51 % (ref 39.0–52.0)
Hemoglobin: 17.3 g/dL — ABNORMAL HIGH (ref 13.0–17.0)
O2 Saturation: 98 %
Patient temperature: 35.9
Potassium: 4.2 mmol/L (ref 3.5–5.1)
Sodium: 141 mmol/L (ref 135–145)
TCO2: 31 mmol/L (ref 22–32)
pCO2 arterial: 57.6 mmHg — ABNORMAL HIGH (ref 32.0–48.0)
pH, Arterial: 7.312 — ABNORMAL LOW (ref 7.350–7.450)
pO2, Arterial: 104 mmHg (ref 83.0–108.0)

## 2020-01-18 LAB — TROPONIN I (HIGH SENSITIVITY)
Troponin I (High Sensitivity): 27 ng/L — ABNORMAL HIGH (ref <18)
Troponin I (High Sensitivity): 24 ng/L — ABNORMAL HIGH (ref <18)

## 2020-01-18 LAB — PHOSPHORUS: Phosphorus: 5.2 mg/dL — ABNORMAL HIGH (ref 2.5–4.6)

## 2020-01-18 LAB — MAGNESIUM: Magnesium: 1.9 mg/dL (ref 1.7–2.4)

## 2020-01-18 LAB — GLUCOSE, CAPILLARY: Glucose-Capillary: 121 mg/dL — ABNORMAL HIGH (ref 70–99)

## 2020-01-18 MED ORDER — PHENYLEPHRINE HCL (PRESSORS) 10 MG/ML IV SOLN
INTRAVENOUS | Status: AC
  Filled 2020-01-18: qty 1

## 2020-01-18 MED ORDER — ONDANSETRON HCL 4 MG/2ML IJ SOLN: 4.0000 mg | Freq: Four times a day (QID) | INTRAMUSCULAR | Status: DC | PRN

## 2020-01-18 MED ORDER — LACTATED RINGERS IV SOLN: INTRAVENOUS | Status: DC | PRN

## 2020-01-18 MED ORDER — FENTANYL CITRATE (PF) 250 MCG/5ML IJ SOLN
INTRAMUSCULAR | Status: AC
  Filled 2020-01-18: qty 5

## 2020-01-18 MED ORDER — PANTOPRAZOLE SODIUM 40 MG IV SOLR
40.0000 mg | INTRAVENOUS | Status: DC
  Administered 2020-01-18: 40 mg via INTRAVENOUS
  Filled 2020-01-18: qty 40

## 2020-01-18 MED ORDER — PROPOFOL 10 MG/ML IV BOLUS
INTRAVENOUS | Status: DC | PRN
  Administered 2020-01-17: 130 mg via INTRAVENOUS

## 2020-01-18 MED ORDER — MORPHINE SULFATE (PF) 2 MG/ML IV SOLN
2.0000 mg | INTRAVENOUS | Status: DC | PRN
  Administered 2020-01-19 – 2020-01-23 (×14): 2 mg via INTRAVENOUS
  Filled 2020-01-18 (×14): qty 1

## 2020-01-18 MED ORDER — POLYETHYLENE GLYCOL 3350 17 G PO PACK: 17.0000 g | PACK | Freq: Every day | ORAL | Status: DC | PRN

## 2020-01-18 MED ORDER — LIDOCAINE 5 % EX PTCH
1.0000 | MEDICATED_PATCH | CUTANEOUS | Status: DC
  Administered 2020-01-18 – 2020-01-24 (×7): 1 via TRANSDERMAL
  Filled 2020-01-18 (×8): qty 1

## 2020-01-18 MED ORDER — MIDAZOLAM HCL 2 MG/2ML IJ SOLN
INTRAMUSCULAR | Status: AC
  Filled 2020-01-18: qty 2

## 2020-01-18 MED ORDER — PHENYLEPHRINE HCL-NACL 10-0.9 MG/250ML-% IV SOLN
INTRAVENOUS | Status: DC | PRN
  Administered 2020-01-18: 80 ug/min via INTRAVENOUS

## 2020-01-18 MED ORDER — ONDANSETRON HCL 4 MG/2ML IJ SOLN
INTRAMUSCULAR | Status: AC
  Filled 2020-01-18: qty 2

## 2020-01-18 MED ORDER — PROPOFOL 500 MG/50ML IV EMUL: 5.0000 ug/kg/min | INTRAVENOUS | Status: DC

## 2020-01-18 MED ORDER — ACETAMINOPHEN 10 MG/ML IV SOLN
1000.0000 mg | Freq: Four times a day (QID) | INTRAVENOUS | Status: AC
  Administered 2020-01-18 – 2020-01-19 (×4): 1000 mg via INTRAVENOUS
  Filled 2020-01-18 (×4): qty 100

## 2020-01-18 MED ORDER — ONDANSETRON HCL 4 MG/2ML IJ SOLN
INTRAMUSCULAR | Status: DC | PRN
  Administered 2020-01-18: 4 mg via INTRAVENOUS

## 2020-01-18 MED ORDER — ORAL CARE MOUTH RINSE
15.0000 mL | Freq: Two times a day (BID) | OROMUCOSAL | Status: DC
  Administered 2020-01-18 – 2020-01-24 (×12): 15 mL via OROMUCOSAL

## 2020-01-18 MED ORDER — CHLORHEXIDINE GLUCONATE 0.12% ORAL RINSE (MEDLINE KIT)
15.0000 mL | Freq: Two times a day (BID) | OROMUCOSAL | Status: DC
  Administered 2020-01-18: 15 mL via OROMUCOSAL

## 2020-01-18 MED ORDER — 0.9 % SODIUM CHLORIDE (POUR BTL) OPTIME
TOPICAL | Status: DC | PRN
  Administered 2020-01-18: 3000 mL

## 2020-01-18 MED ORDER — DEXAMETHASONE SODIUM PHOSPHATE 10 MG/ML IJ SOLN
INTRAMUSCULAR | Status: DC | PRN
  Administered 2020-01-18: 8 mg via INTRAVENOUS

## 2020-01-18 MED ORDER — IPRATROPIUM-ALBUTEROL 20-100 MCG/ACT IN AERS
2.0000 | INHALATION_SPRAY | Freq: Four times a day (QID) | RESPIRATORY_TRACT | Status: DC
  Filled 2020-01-18: qty 4

## 2020-01-18 MED ORDER — SUCCINYLCHOLINE CHLORIDE 200 MG/10ML IV SOSY
PREFILLED_SYRINGE | INTRAVENOUS | Status: AC
  Filled 2020-01-18: qty 10

## 2020-01-18 MED ORDER — DOCUSATE SODIUM 100 MG PO CAPS: 100.0000 mg | ORAL_CAPSULE | Freq: Two times a day (BID) | ORAL | Status: DC | PRN

## 2020-01-18 MED ORDER — ROCURONIUM BROMIDE 10 MG/ML (PF) SYRINGE
PREFILLED_SYRINGE | INTRAVENOUS | Status: DC | PRN
  Administered 2020-01-18: 60 mg via INTRAVENOUS

## 2020-01-18 MED ORDER — SUGAMMADEX SODIUM 200 MG/2ML IV SOLN
INTRAVENOUS | Status: DC | PRN
  Administered 2020-01-18: 130 mg via INTRAVENOUS

## 2020-01-18 MED ORDER — FENTANYL CITRATE (PF) 100 MCG/2ML IJ SOLN
INTRAMUSCULAR | Status: DC | PRN
Start: 2020-01-17 — End: 2020-01-18
  Administered 2020-01-17: 100 ug via INTRAVENOUS
  Administered 2020-01-18 (×2): 50 ug via INTRAVENOUS

## 2020-01-18 MED ORDER — FENTANYL CITRATE (PF) 100 MCG/2ML IJ SOLN: 25.0000 ug | INTRAMUSCULAR | Status: DC | PRN

## 2020-01-18 MED ORDER — DEXTROSE-NACL 5-0.45 % IV SOLN: INTRAVENOUS | Status: DC

## 2020-01-18 MED ORDER — PHENYLEPHRINE 40 MCG/ML (10ML) SYRINGE FOR IV PUSH (FOR BLOOD PRESSURE SUPPORT)
PREFILLED_SYRINGE | INTRAVENOUS | Status: DC | PRN
  Administered 2020-01-17: 120 ug via INTRAVENOUS
  Administered 2020-01-17: 80 ug via INTRAVENOUS
  Administered 2020-01-17: 120 ug via INTRAVENOUS

## 2020-01-18 MED ORDER — ROCURONIUM BROMIDE 10 MG/ML (PF) SYRINGE
PREFILLED_SYRINGE | INTRAVENOUS | Status: AC
  Filled 2020-01-18: qty 10

## 2020-01-18 MED ORDER — METHOCARBAMOL 1000 MG/10ML IJ SOLN
1000.0000 mg | Freq: Three times a day (TID) | INTRAVENOUS | Status: DC
  Administered 2020-01-18 – 2020-01-23 (×15): 1000 mg via INTRAVENOUS
  Filled 2020-01-18: qty 10
  Filled 2020-01-18 (×11): qty 1000
  Filled 2020-01-18: qty 10
  Filled 2020-01-18 (×4): qty 1000

## 2020-01-18 MED ORDER — PROPOFOL 1000 MG/100ML IV EMUL
INTRAVENOUS | Status: AC
  Filled 2020-01-18: qty 100

## 2020-01-18 MED ORDER — PHENYLEPHRINE HCL-NACL 10-0.9 MG/250ML-% IV SOLN: 0.0000 ug/min | INTRAVENOUS | Status: DC

## 2020-01-18 MED ORDER — ORAL CARE MOUTH RINSE
15.0000 mL | OROMUCOSAL | Status: DC
  Administered 2020-01-18 (×2): 15 mL via OROMUCOSAL

## 2020-01-18 MED ORDER — SODIUM CHLORIDE 0.9 % IV BOLUS
250.0000 mL | Freq: Once | INTRAVENOUS | Status: AC
  Administered 2020-01-18: 250 mL via INTRAVENOUS

## 2020-01-18 MED ORDER — FENTANYL 2500MCG IN NS 250ML (10MCG/ML) PREMIX INFUSION
50.0000 ug/h | INTRAVENOUS | Status: DC
  Administered 2020-01-18: 25 ug/h via INTRAVENOUS
  Filled 2020-01-18: qty 250

## 2020-01-18 MED ORDER — FENTANYL CITRATE (PF) 100 MCG/2ML IJ SOLN
50.0000 ug | Freq: Once | INTRAMUSCULAR | Status: AC
  Administered 2020-01-18: 50 ug via INTRAVENOUS
  Filled 2020-01-18: qty 2

## 2020-01-18 MED ORDER — DOPAMINE-DEXTROSE 3.2-5 MG/ML-% IV SOLN: 5.0000 ug/kg/min | INTRAVENOUS | Status: DC

## 2020-01-18 MED ORDER — OXYCODONE HCL 5 MG/5ML PO SOLN: 5.0000 mg | Freq: Once | ORAL | Status: DC | PRN

## 2020-01-18 MED ORDER — IPRATROPIUM-ALBUTEROL 0.5-2.5 (3) MG/3ML IN SOLN
3.0000 mL | Freq: Four times a day (QID) | RESPIRATORY_TRACT | Status: DC
  Administered 2020-01-18 – 2020-01-22 (×16): 3 mL via RESPIRATORY_TRACT
  Filled 2020-01-18 (×16): qty 3

## 2020-01-18 MED ORDER — PROPOFOL 10 MG/ML IV BOLUS
INTRAVENOUS | Status: AC
  Filled 2020-01-18: qty 20

## 2020-01-18 MED ORDER — PROPOFOL 500 MG/50ML IV EMUL
INTRAVENOUS | Status: DC | PRN
  Administered 2020-01-18: 50 ug/kg/min via INTRAVENOUS

## 2020-01-18 MED ORDER — LIDOCAINE 2% (20 MG/ML) 5 ML SYRINGE
INTRAMUSCULAR | Status: DC | PRN
  Administered 2020-01-17: 60 mg via INTRAVENOUS

## 2020-01-18 MED ORDER — LIDOCAINE 2% (20 MG/ML) 5 ML SYRINGE
INTRAMUSCULAR | Status: AC
  Filled 2020-01-18: qty 5

## 2020-01-18 MED ORDER — OXYCODONE HCL 5 MG PO TABS: 5.0000 mg | ORAL_TABLET | Freq: Once | ORAL | Status: DC | PRN

## 2020-01-18 MED ORDER — FENTANYL BOLUS VIA INFUSION
50.0000 ug | INTRAVENOUS | Status: DC | PRN
  Filled 2020-01-18: qty 50

## 2020-01-18 MED ORDER — METHOCARBAMOL 500 MG PO TABS: 1000.0000 mg | ORAL_TABLET | Freq: Three times a day (TID) | ORAL | Status: DC

## 2020-01-18 MED ORDER — SUCCINYLCHOLINE CHLORIDE 200 MG/10ML IV SOSY
PREFILLED_SYRINGE | INTRAVENOUS | Status: DC | PRN
  Administered 2020-01-17: 120 mg via INTRAVENOUS

## 2020-01-18 MED ORDER — DEXMEDETOMIDINE HCL IN NACL 200 MCG/50ML IV SOLN: 0.4000 ug/kg/h | INTRAVENOUS | Status: DC

## 2020-01-18 MED ORDER — MIDAZOLAM HCL 5 MG/5ML IJ SOLN
INTRAMUSCULAR | Status: DC | PRN
  Administered 2020-01-18: 2 mg via INTRAVENOUS

## 2020-01-18 MED ORDER — PERFLUTREN LIPID MICROSPHERE
1.0000 mL | INTRAVENOUS | Status: AC | PRN
  Administered 2020-01-18: 2 mL via INTRAVENOUS
  Filled 2020-01-18: qty 10

## 2020-01-18 MED ORDER — CHLORHEXIDINE GLUCONATE CLOTH 2 % EX PADS
6.0000 | MEDICATED_PAD | Freq: Every day | CUTANEOUS | Status: DC
  Administered 2020-01-18 – 2020-01-20 (×4): 6 via TOPICAL

## 2020-01-18 MED ORDER — ENOXAPARIN SODIUM 40 MG/0.4ML ~~LOC~~ SOLN
40.0000 mg | SUBCUTANEOUS | Status: DC
  Administered 2020-01-18 – 2020-01-23 (×6): 40 mg via SUBCUTANEOUS
  Filled 2020-01-18 (×6): qty 0.4

## 2020-01-18 MED ORDER — PANTOPRAZOLE SODIUM 40 MG IV SOLR
40.0000 mg | Freq: Two times a day (BID) | INTRAVENOUS | Status: DC
  Administered 2020-01-18 – 2020-01-23 (×11): 40 mg via INTRAVENOUS
  Filled 2020-01-18 (×11): qty 40

## 2020-01-18 MED ORDER — DEXAMETHASONE SODIUM PHOSPHATE 10 MG/ML IJ SOLN
INTRAMUSCULAR | Status: AC
  Filled 2020-01-18: qty 1

## 2020-01-18 NOTE — Anesthesia Procedure Notes (Signed)
Arterial Line Insertion Start/End10/13/2021 12:01 AM, 01/18/2020 12:09 AM Performed by: Albertha Ghee, MD, anesthesiologist  Patient location: OR. Preanesthetic checklist: patient identified, IV checked, site marked, risks and benefits discussed, surgical consent, monitors and equipment checked, pre-op evaluation, timeout performed and anesthesia consent Right, radial was placed Catheter size: 20 Fr Hand hygiene performed  and maximum sterile barriers used   Attempts: 1 Procedure performed without using ultrasound guided technique. Following insertion, dressing applied and Biopatch. Post procedure assessment: normal and unchanged  Patient tolerated the procedure well with no immediate complications.

## 2020-01-18 NOTE — Care Plan (Signed)
Pt received from OR accompanied by anesthesia and RN via bed. ETT, NGT, JP, foley in place. On neo and propofol gtts. Pt connected to ICU monitoring equipment and bedside report received. Pt's HR in mid 40's after propofol initiation by anesthesia, titrated down and HR improved. ELink notified of pt's arrival to unit.

## 2020-01-18 NOTE — Op Note (Signed)
Preoperative diagnosis: pneumoperitoneum, incisional hernia  Postoperative diagnosis: perforated pyloric channel ulcer, incisional hernia  Procedure: Graham's patch, incisional hernia repair  Surgeon: Gurney Maxin, M.D.  Asst: none  Anesthesia: general  Indications for procedure: Marcus [name] is a 59 y.o. year old male with symptoms of abdominal pain, fever and findings consistent with perforated abdominal viscous. He was taken emergently to the operating room for exploration and repair.  Description of procedure: The patient was brought into the operative suite. Anesthesia was administered with General endotracheal anesthesia. WHO checklist was applied. The patient was then placed in supine position. The area was prepped and draped in the usual sterile fashion.  Next, a midline incision was made through the upper midline through the entirety of the hernia skin.  Cautery was used to dissect through the hernia sac and the hernia sac was entered safely hernia sac contained good portion of the transverse colon as well as the majority of the omentum.  The fascia was identified within the hernia sac and it was opened for the entire upper abdomen.  Upon entering the abdominal cavity there was some green fluid next to the duodenum and liver this was removed to show a 6 mm superior anterior pyloric channel ulcer.  Attempt was made to take biopsy of the mucosa and sent for pathology.  Next 3 oh silks were used to oppose the gastric tissue.  A tongue of omentum was then brought up and sutured around the repair with 3 oh silks in interrupted fashion.  The abdomen was then irrigated with a large amount of warm saline.  [place] drain was placed through the abdomen and along the repair and a portion went through the hernia as well and was sutured at the skin with 2-0 nylon..  Next, the skin and subcutaneous tissue was removed from the fascia for the upper midline incision and the hernia sac was removed  as well.  The falciform ligament was divided.  The fascia was then closed with a running 0 PDS using 0 Novafil the horizontal mattress sutures as additional retention internal sutures.  Portion of skin was removed to allow good abutment of the skin.  Hemostasis was ensured within the subcutaneous tissues with cautery.  Skin was then closed with staples.  Patient was brought to the ICU intubated due to baseline COPD and respiratory distress in preop. All counts were correct.  Findings: perforated pyloric channel ulcer, large incisional hernia sac with 6 cm defect  Specimen: gastric biopsy  Implant: 19 fr blake drain   Blood loss: 50 ml  Local anesthesia: none  Complications: none  Gurney Maxin, M.D. General, Bariatric, & Minimally Invasive Surgery [facility]

## 2020-01-18 NOTE — Progress Notes (Signed)
SBP now trending in the 70s-80s. E-link notified.

## 2020-01-18 NOTE — Progress Notes (Signed)
[facility] Progress Note Patient Name: GILLIAN KLUEVER DOB: [birth year] MRN: [id number]   Date of Service  01/18/2020  HPI/Events of Note  Patient with low cardiac output state principally due to RV dysfunction ( normal LV EF), right atrial pressure by echo earlier today was estimated at 3 mmHg. Patient is also significantly bradycardic, contributing to the low cardiac output state.  eICU Interventions  NS 250 ml fluid bolus x 1, Dopamine 2.5 mcg / kg /min fixed dose as RV inotrope and overall chronotropic agent.Kerry Kass Clayborn Milnes 01/18/2020, 8:34 PM

## 2020-01-18 NOTE — Anesthesia Procedure Notes (Signed)
Procedure Name: Intubation Date/Time: 01/17/2020 11:53 PM Performed by: Montel Clock, CRNA Pre-anesthesia Checklist: Patient identified, Emergency Drugs available, Suction available, Patient being monitored and Timeout performed Patient Re-evaluated:Patient Re-evaluated prior to induction Oxygen Delivery Method: Circle system utilized Preoxygenation: Pre-oxygenation with 100% oxygen Induction Type: IV induction, Rapid sequence and Cricoid Pressure applied Laryngoscope Size: Mac and 3 Grade View: Grade I Tube type: Oral Tube size: 7.5 mm Number of attempts: 1 Airway Equipment and Method: Stylet Placement Confirmation: ETT inserted through vocal cords under direct vision,  positive ETCO2 and breath sounds checked- equal and bilateral Secured at: 23 cm Tube secured with: Tape Dental Injury: Teeth and Oropharynx as per pre-operative assessment

## 2020-01-18 NOTE — Progress Notes (Signed)
Spoke with Dr. Lucile Shutters of PCCM for critical care management of the patient after surgery

## 2020-01-18 NOTE — Progress Notes (Signed)
Per Dr. Larwance Rote, CCM, MD Pt started weaning.  Pt tolerated PS/CPAP of 5/5 for about 26mins until SBT terminated for high rate and low tidal volume.  Pt started on weaning trial PS=12 and PEEP=5.

## 2020-01-18 NOTE — Progress Notes (Signed)
[facility] Progress Note Patient Name: Marcus [name] DOB: [birth year] MRN: [id number]   Date of Service  01/18/2020  HPI/Events of Note  Patient came up to ICU from the ER with a foley catheter and needs an order for it.  eICU Interventions  Foley catheter ordered.     Intervention Category Minor Interventions: Other:  Frederik Pear 01/18/2020, 3:09 AM

## 2020-01-18 NOTE — Progress Notes (Signed)
   NAME:  Marcus [name], MRN:  [id number], DOB:  Mar 17, 1961, LOS: 1 ADMISSION DATE:  01/17/2020, CONSULTATION DATE:  01/18/20 REFERRING MD: Dr. Eulis Foster, CHIEF COMPLAINT: Abdominal pain  Brief History   59 year old male that presented with abdominal pain and found to have pneumoperitoneum secondary to pyloric channel ulceration and perforation.  S/p Ex lap repair. PCCM consulted for vent management  Past Medical History  COPD Rheumatoid arthritis Felty syndrome  Procedures:  10/12 >> Phillip Heal patch with incisional hernia repair, exploratory laparotomy  Significant Diagnostic Tests:  CT abdomen 01/17/20 >> Pneumoperitoneum.   Micro Data:  Covid negative Influenza negative  Antimicrobials:  Vancomycin Zosyn  Objective   Blood pressure 112/73, pulse 69, temperature 98 F (36.7 C), temperature source Oral, resp. rate 17, height 5\' 10"  (1.778 m), weight 75.5 kg, SpO2 94 %.    Vent Mode: PSV;CPAP FiO2 (%):  [40 %-100 %] 40 % Set Rate:  [18 bmp] 18 bmp Vt Set:  [580 mL] 580 mL PEEP:  [5 cmH20] 5 cmH20 Pressure Support:  [5 IRW43-15 cmH20] 5 cmH20 Plateau Pressure:  [15 cmH20] 15 cmH20   Intake/Output Summary (Last 24 hours) at 01/18/2020 0915 Last data filed at 01/18/2020 0900 Gross per 24 hour  Intake 2256.59 ml  Output 495 ml  Net 1761.59 ml   Filed Weights   01/17/20 1451 01/18/20 0350  Weight: 63.8 kg 75.5 kg    Examination: Gen:      No acute distress HEENT:  EOMI, sclera anicteric Neck:     No masses; no thyromegaly Lungs:    Clear to auscultation bilaterally; normal respiratory effort CV:         Regular rate and rhythm; no murmurs Abd:      + bowel sounds; soft, non-tender; no palpable masses, no distension Ext:    No edema; adequate peripheral perfusion Skin:      Warm and dry; no rash Neuro: alert and oriented x 3 Psych: normal mood and affect  Labs/imaging significant for WBC 18.1, sodium 134. Hemoglobin 16.5 No new imaging   Assessment & Plan:  Acute  respiratory failure Pneumoperitoneum 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 Status post exploratory laparotomy with Phillip Heal patch repair and incisional hernia repair COPD Felty syndrome Pancytopenia Rheumatoid arthritis Active smoker   Plan: Patient on weaning trial with good parameters Stable for extubation Off pressors Continue broad antibiotic coverage Follow echocardiogram  Best practice:  Diet: N.p.o. Pain/Anxiety/Delirium protocol (if indicated): Propofol/fentanyl VAP protocol (if indicated): Initiated DVT prophylaxis: Lovenox GI prophylaxis: Protonix Glucose control: Monitor blood sugar Mobility: Bedrest Code Status: Full Family Communication:  Disposition: Continue to monitor in the intensive care unit at this time  Critical care time:   The patient is critically ill with multiple organ system failure and requires high complexity decision making for assessment and support, frequent evaluation and titration of therapies, advanced monitoring, review of radiographic studies and interpretation of complex data.   Critical Care Time devoted to patient care services, exclusive of separately billable procedures, described in this note is 35 minutes.   Marshell Garfinkel MD [facility] Please see [email or web address] for pager details.  01/18/2020, 9:19 AM

## 2020-01-18 NOTE — Care Plan (Signed)
Per Dr. Earlie Server, wean to extubate patient; no ABG needed at this time. Wean FIO2 to keep sats >89%. RT notified.

## 2020-01-18 NOTE — TOC Initial Note (Signed)
Transition of Care [facility]) - Initial/Assessment Note    Patient Details  Name: Marcus [name] MRN: [id number] Date of Birth: 1960/11/04  [facility] CM/SW Contact:    Leeroy Cha, RN Phone Number: 01/18/2020, 8:38 AM  Clinical Narrative:                 On the vent at 40% temp102, artieral lines in place, wbc 18.1 Pod 1 Preoperative diagnosis: pneumoperitoneum, incisional hernia  Postoperative diagnosis: perforated pyloric channel ulcer, incisional hernia  Procedure: Graham's patch, incisional hernia repair   Expected Discharge Plan: Home w Home Health Services Barriers to Discharge: Barriers Unresolved (comment)   Patient Goals and CMS Choice Patient states their goals for this hospitalization and ongoing recovery are:: to go home CMS Medicare.gov Compare Post Acute Care list provided to:: Patient    Expected Discharge Plan and Services Expected Discharge Plan: [place]   Discharge Planning Services: CM Consult   Living arrangements for the past 2 months: Single Family Home                                      Prior Living Arrangements/Services Living arrangements for the past 2 months: Single Family Home Lives with:: Self Patient language and need for interpreter reviewed:: Yes Do you feel safe going back to the place where you live?: Yes      Need for Family Participation in Patient Care: Yes (Comment) Care giver support system in place?: Yes (comment)   Criminal Activity/Legal Involvement Pertinent to Current Situation/Hospitalization: No - Comment as needed  Activities of Daily Living      Permission Sought/Granted                  Emotional Assessment Appearance:: Appears stated age Attitude/Demeanor/Rapport: Engaged Affect (typically observed): Calm Orientation: : Oriented to Self, Oriented to Place, Oriented to  Time, Oriented to Situation Alcohol / Substance Use: Not Applicable Psych Involvement:  No (comment)  Admission diagnosis:  Pneumoperitoneum [K66.8] Hypoxia [R09.02] COPD exacerbation ([place]) [J44.1] Perforated abdominal viscus [R19.8] Patient Active Problem List   Diagnosis Date Noted  . Perforated prepyloric ulcer 01/18/2020 01/18/2020  . Pneumoperitoneum 01/17/2020  . COPD with acute exacerbation (Home) 08/18/2019  . 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 Anemia 11/14/2015  . Seropositive rheumatoid arthritis ([place])   . Chronic obstructive pulmonary disease ([place])   . Protein-calorie malnutrition, severe 07/24/2015  . Pancytopenia ([place]) 07/23/2015  . 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 Leukopenia 10/21/2012  . 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 Abnormal EKG 10/21/2012  . Joint pain 10/21/2012  . Splenomegaly 10/21/2012   PCP:  Pamella Pert, MD Pharmacy:   [id number] - [place], [place] - [address] [place] [address] Phone: [phone] Fax: [phone]     Social Determinants of Health (SDOH) Interventions    Readmission Risk Interventions No flowsheet data found.

## 2020-01-18 NOTE — Progress Notes (Signed)
Echocardiogram 2D Echocardiogram has been performed.  Oneal Deputy Lamel Mccarley 01/18/2020, 2:19 PM

## 2020-01-18 NOTE — Progress Notes (Signed)
1 Day Post-Op  Subjective: Patient underwent ex lap with repair of perforated prepyloric ulcer overnight. Remains intubated this morning but alert and following commands. Hemodynamically stable.   Objective: Vital signs in last 24 hours: Temp:  [97.9 F (36.6 C)-102.2 F (39 C)] 98 F (36.7 C) (10/13 0350) Pulse Rate:  [51-132] 69 (10/13 0600) Resp:  [16-43] 17 (10/13 0600) BP: (65-115)/(43-83) 112/73 (10/13 0200) SpO2:  [84 %-100 %] 94 % (10/13 0600) Arterial Line BP: (102-126)/(53-66) 102/53 (10/13 0600) FiO2 (%):  [40 %-100 %] 40 % (10/13 0810) Weight:  [63.8 kg-75.5 kg] 75.5 kg (10/13 0350)    Intake/Output from previous day: 10/12 0701 - 10/13 0700 In: 2146.8 [I.V.:1306.8; NG/GT:10; IV Piggyback:530] Out: 475 [Urine:350; Drains:75; Blood:50] Intake/Output this shift: No intake/output data recorded.  PE: General: resting in bed, NAD Neuro: alert, follows commands, GCS 11T HEENT: ETT and NG tube in place CV: RRR Resp: intubated, on vent Vent Mode: PSV;CPAP FiO2 (%):  [40 %-100 %] 40 % Set Rate:  [18 bmp] 18 bmp Vt Set:  [580 mL] 580 mL PEEP:  [5 cmH20] 5 cmH20 Pressure Support:  [5 cmH20-12 cmH20] 5 cmH20 Plateau Pressure:  [15 cmH20] 15 cmH20 Abdomen: soft, nondistended, midline incision with clean dry dressing in place. JP with serosanguinous drainage.  Lab Results:  Recent Labs    01/17/20 1454 01/17/20 1454 01/18/20 0029 01/18/20 0300  WBC 9.4  --   --  18.1*  HGB 19.1*   < > 17.3* 16.5  HCT 60.0*   < > 51.0 53.0*  PLT 346  --   --  334   < > = values in this interval not displayed.   BMET Recent Labs    01/17/20 1454 01/17/20 1454 01/18/20 0029 01/18/20 0300  NA 134*   < > 141 134*  K 4.3   < > 4.2 4.3  CL 93*  --   --  102  CO2 30  --   --  26  GLUCOSE 120*  --   --  164*  BUN 20  --   --  22*  CREATININE 0.78  --   --  0.80  CALCIUM 8.1*  --   --  7.1*   < > = values in this interval not displayed.   PT/INR No results for  input(s): LABPROT, INR in the last 72 hours. CMP     Component Value Date/Time   NA 134 (L) 01/18/2020 0300   K 4.3 01/18/2020 0300   CL 102 01/18/2020 0300   CO2 26 01/18/2020 0300   GLUCOSE 164 (H) 01/18/2020 0300   BUN 22 (H) 01/18/2020 0300   CREATININE 0.80 01/18/2020 0300   CALCIUM 7.1 (L) 01/18/2020 0300   PROT 8.1 01/17/2020 1454   ALBUMIN 2.5 (L) 01/17/2020 1454   AST 22 01/17/2020 1454   ALT 9 01/17/2020 1454   ALKPHOS 94 01/17/2020 1454   BILITOT 1.6 (H) 01/17/2020 1454   GFRNONAA >60 01/18/2020 0300   GFRAA >60 11/30/2019 1938   Lipase     Component Value Date/Time   LIPASE 52 (H) 11/30/2019 1938       Studies/Results: CT Angio Chest PE W/Cm &/Or Wo Cm  Result Date: 01/17/2020 CLINICAL DATA:  Hypoxia and elevated D-dimer EXAM: CT ANGIOGRAPHY CHEST WITH CONTRAST TECHNIQUE: Multidetector CT imaging of the chest was performed using the standard protocol during bolus administration of intravenous contrast. Multiplanar CT image reconstructions and MIPs were obtained to evaluate the vascular anatomy.  CONTRAST:  117mL OMNIPAQUE IOHEXOL 350 MG/ML SOLN COMPARISON:  Chest x-ray from earlier in the same day, CT from 11/14/2015 FINDINGS: Cardiovascular: Thoracic aorta demonstrates atherosclerotic calcification without aneurysmal dilatation or dissection. No cardiac enlargement is noted. Coronary calcifications are seen. Pulmonary artery demonstrates a normal branching pattern bilaterally. No filling defect to suggest pulmonary embolism is noted. Mediastinum/Nodes: Thoracic inlet is within normal limits. The esophagus is within normal limits. Scattered small hilar and mediastinal adenopathy is noted Lungs/Pleura: Thelungs are well aerated bilaterally and demonstrate diffuse reticulonodular and tree-in-bud changes consistent with multifocal inflammatory change. No focal confluent infiltrate is noted. Mild bronchial thickening is seen as well. No sizable parenchymal nodule is noted. No  effusion or pneumothorax is seen. Diffuse emphysematous changes are noted. Upper Abdomen: Visualized upper abdomen demonstrates evidence of free air as well as a large ventral hernia with loops of transverse colon within. This does not appear to be obstructive in nature although no CT of the abdomen and pelvis is recommended. The free air is somewhat centered around the stomach which may be related to a perforated gastric ulcer. Again dedicated abdominal CT is recommended. Musculoskeletal: Degenerative changes of the thoracic spine are noted. Old rib fractures with healing are seen on the right. Review of the MIP images confirms the above findings. IMPRESSION: Diffuse reticulonodular and tree-in-bud changes consistent with acute atypical pneumonia. No focal confluent infiltrate is noted. No evidence of pulmonary emboli. Free air within the abdomen with some thickening of the gastric wall and surrounding inflammatory change suspicious for perforated gastric ulcer. Dedicated CT of the abdomen and pelvis is recommended. Large ventral hernia containing transverse colon although no definitive obstructive changes are seen. This would also be well evaluated on a CT of the abdomen and pelvis. Aortic Atherosclerosis (ICD10-I70.0) and Emphysema (ICD10-J43.9). Critical Value/emergent results were called by telephone at the time of interpretation on 01/17/2020 at 5:54 pm to Dr. Daleen Bo , who verbally acknowledged these results. Electronically Signed   By: Inez Catalina M.D.   On: 01/17/2020 17:55   CT Abdomen Pelvis W Contrast  Result Date: 01/17/2020 CLINICAL DATA:  Peritonitis.  Possible pneumoperitoneum. EXAM: CT ABDOMEN AND PELVIS WITH CONTRAST TECHNIQUE: Multidetector CT imaging of the abdomen and pelvis was performed using the standard protocol following bolus administration of intravenous contrast. CONTRAST:  59mL OMNIPAQUE IOHEXOL 300 MG/ML  SOLN COMPARISON:  CT of same day.  Aug 19, 2019. FINDINGS: Lower  chest: Stable reticular opacity seen in visualized lung bases consistent with atypical inflammation. Hepatobiliary: Minimal cholelithiasis is noted. No biliary dilatation is noted. Liver is unremarkable. Pancreas: Unremarkable. No pancreatic ductal dilatation or surrounding inflammatory changes. Spleen: Status post splenectomy. Adrenals/Urinary Tract: Adrenal glands are unremarkable. Kidneys are normal, without renal calculi, focal lesion, or hydronephrosis. Bladder is unremarkable. Stomach/Bowel: The appendix appears normal. Wall thickening and surrounding inflammatory changes are seen involving the distal stomach concerning for peptic ulcer disease. Large epigastric ventral hernia is again noted which contains a loop of transverse colon; there is noted dilatation of this portion of the transverse colon. Free air is noted in the epigastric region in the right subdiaphragmatic space and epigastric region as well as in within the hernia, consistent with rupture of hollow viscus. Vascular/Lymphatic: Atherosclerosis of abdominal aorta is noted. Stable 3.1 cm infrarenal abdominal aortic aneurysm is noted. No adenopathy is noted. Reproductive: Prostate is unremarkable. Other: Moderate size fat containing right inguinal hernia is noted. No ascites is noted. Musculoskeletal: No acute or significant osseous findings. IMPRESSION: 1. Free  air is noted in the epigastric region, in the right subdiaphragmatic space and within the hernia, consistent with rupture of hollow viscus. Wall thickening and surrounding inflammatory changes are seen involving the distal stomach concerning for peptic ulcer disease. Critical Value/emergent results were called by telephone at the time of interpretation on 01/17/2020 at 7:57 pm to provider [facility] , who verbally acknowledged these results. 2. Large epigastric ventral hernia is again noted which contains a loop of transverse colon; there is noted dilatation of this portion of the  transverse colon. 3. Minimal cholelithiasis is noted. 4. Stable 3.1 cm infrarenal abdominal aortic aneurysm. Recommend follow-up ultrasound every 3 years. This recommendation follows ACR consensus guidelines: White Paper of the ACR Incidental Findings Committee II on Vascular Findings. J Am Coll Radiol 2013; 10:789-794. 5. Moderate size fat containing right inguinal hernia. 6. Stable reticular opacity is seen in visualized lung bases consistent with atypical inflammation. 7. Aortic atherosclerosis. Aortic Atherosclerosis (ICD10-I70.0). Electronically Signed   By: Marijo Conception M.D.   On: 01/17/2020 19:58   DG Chest Port 1 View  Result Date: 01/17/2020 CLINICAL DATA:  Shortness of breath. EXAM: PORTABLE CHEST 1 VIEW COMPARISON:  November 30, 2019. FINDINGS: The heart size and mediastinal contours are within normal limits. No pneumothorax or pleural effusion is noted. Stable reticular densities are noted throughout both lungs most consistent with chronic interstitial scarring or fibrosis, but acute superimposed inflammation or edema cannot be excluded. The visualized skeletal structures are unremarkable. IMPRESSION: Stable reticular densities are noted throughout both lungs most consistent with chronic interstitial scarring or fibrosis, but acute superimposed inflammation or edema cannot be excluded. Electronically Signed   By: Marijo Conception M.D.   On: 01/17/2020 15:51    Anti-infectives: Anti-infectives (From admission, onward)   Start     Dose/Rate Route Frequency Ordered Stop   01/18/20 0800  vancomycin (VANCOREADY) IVPB 750 mg/150 mL        750 mg 150 mL/hr over 60 Minutes Intravenous Every 12 hours 01/17/20 1833     01/18/20 0030  piperacillin-tazobactam (ZOSYN) IVPB 3.375 g        3.375 g 12.5 mL/hr over 240 Minutes Intravenous Every 8 hours 01/17/20 2350     01/17/20 2330  piperacillin-tazobactam (ZOSYN) IVPB 3.375 g  Status:  Discontinued        3.375 g 100 mL/hr over 30 Minutes  Intravenous Every 8 hours 01/17/20 2324 01/17/20 2348   01/17/20 1845  vancomycin (VANCOREADY) IVPB 1500 mg/300 mL        1,500 mg 150 mL/hr over 120 Minutes Intravenous  Once 01/17/20 1820 01/18/20 0130   01/17/20 1800  piperacillin-tazobactam (ZOSYN) IVPB 3.375 g        3.375 g 100 mL/hr over 30 Minutes Intravenous  Once 01/17/20 1800 01/17/20 1858       Assessment/Plan 59 yo male presenting with abdominal pain and pneumoperitoneum, POD1 s/p ex lap, hernia repair and Graham patch repair of perforated prepyloric ulcer. - Strict NPO, NG tube to low intermittent suction - Continue IV fluid hydration, patient was very hemoconcentrated on admission labs - IV antibiotics with gram negative coverage - Ventilator management per CCM - VTE: lovenox, SCDs - Surgery will continue to follow   LOS: 1 day   Michaelle Birks, MD [facility] General, Hepatobiliary and Pancreatic Surgery 01/18/20 9:01 AM

## 2020-01-18 NOTE — Procedures (Signed)
Extubation Procedure Note  Patient Details:   Name: Marcus [name] DOB: November 14, 1960 MRN: [id number]   Airway Documentation:    Vent end date: 01/18/20 Vent end time: 1011   Evaluation  O2 sats: stable throughout Complications: No apparent complications Patient did tolerate procedure well. Bilateral Breath Sounds: Clear, Diminished   Yes  Johnette Abraham 01/18/2020, 10:11 AM

## 2020-01-18 NOTE — Transfer of Care (Addendum)
Immediate Anesthesia Transfer of Care Note  Patient: Marcus [name]  Procedure(s) Performed: EXPLORATORY LAPAROTOMY (N/A Abdomen) HERNIA REPAIR INCISIONAL AND GRAHAM PATCH (Abdomen)  Patient Location: ICU  Anesthesia Type:General  Level of Consciousness: sedated  Airway & Oxygen Therapy: Patient remains intubated per anesthesia plan and Patient placed on Ventilator (see vital sign flow sheet for setting)  Post-op Assessment: Report given to RN and Post -op Vital signs reviewed and stable  Post vital signs: Reviewed and stable  Last Vitals:  Vitals Value Taken Time  BP    Temp    Pulse 59 01/18/20 0142  Resp 17 01/18/20 0141  SpO2 100 % 01/18/20 0142  Vitals shown include unvalidated device data.  Last Pain:  Vitals:   01/17/20 1630  TempSrc: Axillary  PainSc:          Complications: No complications documented.

## 2020-01-18 NOTE — Consult Note (Signed)
CARDIOLOGY CONSULT NOTE       Patient ID: Marcus [name] MRN: [id number] DOB/AGE: 1960-11-16 59 y.o.  Admit date: 01/17/2020 Referring Physician: Learta Codding Primary Physician: Pamella Pert, MD Primary Cardiologist: New Reason for Consultation: ? RV Infarct  Principal Problem:   Perforated prepyloric ulcer 01/18/2020 Active Problems:   Chronic obstructive pulmonary disease ([place])   Pneumoperitoneum   HPI:  59 y.o. poor health care f/u. Smoker with chronic COPD / ILD. History of splenectomy and Felty's syndrome Chronic weakness and cough on chronic steroids ? For COPD or RA. Admitted with perforated pyloric ulcer and incisional hernia Echo done post op with normal LV function EF 55-60% but severe RVE/RV hypokinesis and RVH. 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 He is having no chest pain. Initial troponin  Negative 52->59.  CTA negative for PE. Nurse concerned about transient bradycardia in 50's but no AV block Currently fairly comfortable considering surgery with stable hemodynamics and NG tube in place   ROS All other systems reviewed and negative except as noted above  Past Medical History:  Diagnosis Date  . COPD (chronic obstructive pulmonary disease) ([place])   . 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 Hernia, epigastric   . Pancytopenia ([place])   . Seropositive rheumatoid arthritis ([place])   . Tobacco use disorder     Family History  Problem Relation Age of Onset  . CAD Brother   . COPD Brother     Social History   Socioeconomic History  . Marital status: Single    Spouse name: Not on file  . Number of children: Not on file  . Years of education: Not on file  . Highest education level: Not on file  Occupational History  . Not on file  Tobacco Use  . Smoking status: Current Every Day Smoker    Packs/day: 1.00    Types: Cigarettes  . Smokeless tobacco: Never Used  Substance and Sexual Activity  . Alcohol use: No     Alcohol/week: 0.0 standard drinks  . Drug use: Yes    Types: Marijuana  . Sexual activity: Not on file  Other Topics Concern  . Not on file  Social History Narrative   Lives at home with Mother. Ambulates independently.   Social Determinants of Health   Financial Resource Strain:   . Difficulty of Paying Living Expenses: Not on file  Food Insecurity:   . Worried About Charity fundraiser in the Last Year: Not on file  . Ran Out of Food in the Last Year: Not on file  Transportation Needs:   . Lack of Transportation (Medical): Not on file  . Lack of Transportation (Non-Medical): Not on file  Physical Activity:   . Days of Exercise per Week: Not on file  . Minutes of Exercise per Session: Not on file  Stress:   . Feeling of Stress : Not on file  Social Connections:   . Frequency of Communication with Friends and Family: Not on file  . Frequency of Social Gatherings with Friends and Family: Not on file  . Attends Religious Services: Not on file  . Active Member of Clubs or Organizations: Not on file  . Attends Archivist Meetings: Not on file  . Marital Status: Not on file  Intimate Partner Violence:   . Fear of Current or Ex-Partner: Not on file  . Emotionally Abused: Not on file  . Physically Abused: Not  on file  . Sexually Abused: Not on file    Past Surgical History:  Procedure Laterality Date  . INCISIONAL HERNIA REPAIR  01/17/2020   Procedure: HERNIA REPAIR INCISIONAL AND Silvestre Gunner;  Surgeon: Kinsinger, Arta Bruce, MD;  Location: [facility];  Service: General;;  . LAPAROTOMY N/A 01/17/2020   Procedure: EXPLORATORY LAPAROTOMY;  Surgeon: Kieth Brightly Arta Bruce, MD;  Location: [facility];  Service: General;  Laterality: N/A;      Current Facility-Administered Medications:  .  acetaminophen (OFIRMEV) IV 1,000 mg, 1,000 mg, Intravenous, Q6H, Simaan, Elizabeth S, PA-C, Stopped at 01/18/20 1145 .  Chlorhexidine Gluconate Cloth 2 % PADS 6 each, 6 each, Topical, Daily,  Kinsinger, Arta Bruce, MD, 6 each at 01/18/20 0151 .  dextrose 5 %-0.45 % sodium chloride infusion, , Intravenous, Continuous, Simaan, Elizabeth S, PA-C, Last Rate: 100 mL/hr at 01/18/20 1500, Rate Verify at 01/18/20 1500 .  enoxaparin (LOVENOX) injection 40 mg, 40 mg, Subcutaneous, Q24H, Rhys Martini R, MD .  fentaNYL (SUBLIMAZE) bolus via infusion 50 mcg, 50 mcg, Intravenous, Q15 min PRN, Deland Pretty, MD .  ipratropium-albuterol (DUONEB) 0.5-2.5 (3) MG/3ML nebulizer solution 3 mL, 3 mL, Nebulization, Q6H, Deland Pretty, MD, 3 mL at 01/18/20 1422 .  lidocaine (LIDODERM) 5 % 1 patch, 1 patch, Transdermal, Q24H, Simaan, Darci Current, PA-C, 1 patch at 01/18/20 1130 .  MEDLINE mouth rinse, 15 mL, Mouth Rinse, BID, Mannam, Praveen, MD, 15 mL at 01/18/20 1144 .  methocarbamol (ROBAXIN) 1,000 mg in dextrose 5 % 100 mL IVPB, 1,000 mg, Intravenous, Q8H, Simaan, Elizabeth S, PA-C, Last Rate: 200 mL/hr at 01/18/20 1534, 1,000 mg at 01/18/20 1534 .  morphine 2 MG/ML injection 2-4 mg, 2-4 mg, Intravenous, Q1H PRN, Simaan, Elizabeth S, PA-C .  ondansetron (ZOFRAN) injection 4 mg, 4 mg, Intravenous, Q6H PRN, Deland Pretty, MD .  pantoprazole (PROTONIX) injection 40 mg, 40 mg, Intravenous, Q12H, Dwan Bolt, MD, 40 mg at 01/18/20 6606 .  perflutren lipid microspheres (DEFINITY) IV suspension, 1-10 mL, Intravenous, PRN, Deland Pretty, MD, 2 mL at 01/18/20 1454 .  piperacillin-tazobactam (ZOSYN) IVPB 3.375 g, 3.375 g, Intravenous, Q8H, Kinsinger, Arta Bruce, MD, Stopped at 01/18/20 1242 . Chlorhexidine Gluconate Cloth  6 each Topical Daily  . enoxaparin (LOVENOX) injection  40 mg Subcutaneous Q24H  . ipratropium-albuterol  3 mL Nebulization Q6H  . lidocaine  1 patch Transdermal Q24H  . mouth rinse  15 mL Mouth Rinse BID  . pantoprazole (PROTONIX) IV  40 mg Intravenous Q12H   . acetaminophen Stopped (01/18/20 1145)  . dextrose 5 % and 0.45% NaCl 100 mL/hr at  01/18/20 1500  . methocarbamol (ROBAXIN) IV 1,000 mg (01/18/20 1534)  . piperacillin-tazobactam (ZOSYN)  IV Stopped (01/18/20 1242)    Physical Exam: Blood pressure (!) 93/52, pulse 64, temperature 97.7 F (36.5 C), temperature source Oral, resp. rate (!) 21, height 5\' 10"  (1.778 m), weight 75.5 kg, SpO2 93 %.    Affect appropriate Chronically ill male  HEENT: poor dentition  Neck supple with no adenopathy JVP normal no bruits no thyromegaly Lungs course cough and rhonchi  Heart:  S1/S2 no murmur, no rub, gallop or click PMI normal Abdomen: dressing in place post surgery  no bruit.  No HSM or HJR Distal pulses intact with no bruits No edema Neuro non-focal Skin warm and dry No muscular weakness   Labs:   Lab Results  Component Value Date   WBC 18.1 (H) 01/18/2020   HGB 16.5 01/18/2020  HCT 53.0 (H) 01/18/2020   MCV 90.8 01/18/2020   PLT 334 01/18/2020    Recent Labs  Lab 01/17/20 1454 01/18/20 0029 01/18/20 0300  NA 134*   < > 134*  K 4.3   < > 4.3  CL 93*  --  102  CO2 30  --  26  BUN 20  --  22*  CREATININE 0.78  --  0.80  CALCIUM 8.1*  --  7.1*  PROT 8.1  --   --   BILITOT 1.6*  --   --   ALKPHOS 94  --   --   ALT 9  --   --   AST 22  --   --   GLUCOSE 120*  --  164*   < > = values in this interval not displayed.   Lab Results  Component Value Date   TROPONINI <0.03 03/03/2017   No results found for: CHOL No results found for: HDL No results found for: [facility] Lab Results  Component Value Date   TRIG 142 01/17/2020   No results found for: CHOLHDL No results found for: LDLDIRECT    Radiology: CT Angio Chest PE W/Cm &/Or Wo Cm  Result Date: 01/17/2020 CLINICAL DATA:  Hypoxia and elevated D-dimer EXAM: CT ANGIOGRAPHY CHEST WITH CONTRAST TECHNIQUE: Multidetector CT imaging of the chest was performed using the standard protocol during bolus administration of intravenous contrast. Multiplanar CT image reconstructions and MIPs were obtained to  evaluate the vascular anatomy. CONTRAST:  146mL OMNIPAQUE IOHEXOL 350 MG/ML SOLN COMPARISON:  Chest x-ray from earlier in the same day, CT from 11/14/2015 FINDINGS: Cardiovascular: Thoracic aorta demonstrates atherosclerotic calcification without aneurysmal dilatation or dissection. No cardiac enlargement is noted. Coronary calcifications are seen. Pulmonary artery demonstrates a normal branching pattern bilaterally. No filling defect to suggest pulmonary embolism is noted. Mediastinum/Nodes: Thoracic inlet is within normal limits. The esophagus is within normal limits. Scattered small hilar and mediastinal adenopathy is noted Lungs/Pleura: Thelungs are well aerated bilaterally and demonstrate diffuse reticulonodular and tree-in-bud changes consistent with multifocal inflammatory change. No focal confluent infiltrate is noted. Mild bronchial thickening is seen as well. No sizable parenchymal nodule is noted. No effusion or pneumothorax is seen. Diffuse emphysematous changes are noted. Upper Abdomen: Visualized upper abdomen demonstrates evidence of free air as well as a large ventral hernia with loops of transverse colon within. This does not appear to be obstructive in nature although no CT of the abdomen and pelvis is recommended. The free air is somewhat centered around the stomach which may be related to a perforated gastric ulcer. Again dedicated abdominal CT is recommended. Musculoskeletal: Degenerative changes of the thoracic spine are noted. Old rib fractures with healing are seen on the right. Review of the MIP images confirms the above findings. IMPRESSION: Diffuse reticulonodular and tree-in-bud changes consistent with acute atypical pneumonia. No focal confluent infiltrate is noted. No evidence of pulmonary emboli. Free air within the abdomen with some thickening of the gastric wall and surrounding inflammatory change suspicious for perforated gastric ulcer. Dedicated CT of the abdomen and pelvis is  recommended. Large ventral hernia containing transverse colon although no definitive obstructive changes are seen. This would also be well evaluated on a CT of the abdomen and pelvis. Aortic Atherosclerosis (ICD10-I70.0) and Emphysema (ICD10-J43.9). Critical Value/emergent results were called by telephone at the time of interpretation on 01/17/2020 at 5:54 pm to Dr. Daleen Bo , who verbally acknowledged these results. Electronically Signed   By: Linus Mako.D.  On: 01/17/2020 17:55   CT Abdomen Pelvis W Contrast  Result Date: 01/17/2020 CLINICAL DATA:  Peritonitis.  Possible pneumoperitoneum. EXAM: CT ABDOMEN AND PELVIS WITH CONTRAST TECHNIQUE: Multidetector CT imaging of the abdomen and pelvis was performed using the standard protocol following bolus administration of intravenous contrast. CONTRAST:  62mL OMNIPAQUE IOHEXOL 300 MG/ML  SOLN COMPARISON:  CT of same day.  Aug 19, 2019. FINDINGS: Lower chest: Stable reticular opacity seen in visualized lung bases consistent with atypical inflammation. Hepatobiliary: Minimal cholelithiasis is noted. No biliary dilatation is noted. Liver is unremarkable. Pancreas: Unremarkable. No pancreatic ductal dilatation or surrounding inflammatory changes. Spleen: Status post splenectomy. Adrenals/Urinary Tract: Adrenal glands are unremarkable. Kidneys are normal, without renal calculi, focal lesion, or hydronephrosis. Bladder is unremarkable. Stomach/Bowel: The appendix appears normal. Wall thickening and surrounding inflammatory changes are seen involving the distal stomach concerning for peptic ulcer disease. Large epigastric ventral hernia is again noted which contains a loop of transverse colon; there is noted dilatation of this portion of the transverse colon. Free air is noted in the epigastric region in the right subdiaphragmatic space and epigastric region as well as in within the hernia, consistent with rupture of hollow viscus. Vascular/Lymphatic:  Atherosclerosis of abdominal aorta is noted. Stable 3.1 cm infrarenal abdominal aortic aneurysm is noted. No adenopathy is noted. Reproductive: Prostate is unremarkable. Other: Moderate size fat containing right inguinal hernia is noted. No ascites is noted. Musculoskeletal: No acute or significant osseous findings. IMPRESSION: 1. Free air is noted in the epigastric region, in the right subdiaphragmatic space and within the hernia, consistent with rupture of hollow viscus. Wall thickening and surrounding inflammatory changes are seen involving the distal stomach concerning for peptic ulcer disease. Critical Value/emergent results were called by telephone at the time of interpretation on 01/17/2020 at 7:57 pm to provider [facility] , who verbally acknowledged these results. 2. Large epigastric ventral hernia is again noted which contains a loop of transverse colon; there is noted dilatation of this portion of the transverse colon. 3. Minimal cholelithiasis is noted. 4. Stable 3.1 cm infrarenal abdominal aortic aneurysm. Recommend follow-up ultrasound every 3 years. This recommendation follows ACR consensus guidelines: White Paper of the ACR Incidental Findings Committee II on Vascular Findings. J Am Coll Radiol 2013; 10:789-794. 5. Moderate size fat containing right inguinal hernia. 6. Stable reticular opacity is seen in visualized lung bases consistent with atypical inflammation. 7. Aortic atherosclerosis. Aortic Atherosclerosis (ICD10-I70.0). Electronically Signed   By: Marijo Conception M.D.   On: 01/17/2020 19:58   DG Chest Port 1 View  Result Date: 01/17/2020 CLINICAL DATA:  Shortness of breath. EXAM: PORTABLE CHEST 1 VIEW COMPARISON:  November 30, 2019. FINDINGS: The heart size and mediastinal contours are within normal limits. No pneumothorax or pleural effusion is noted. Stable reticular densities are noted throughout both lungs most consistent with chronic interstitial scarring or fibrosis, but acute  superimposed inflammation or edema cannot be excluded. The visualized skeletal structures are unremarkable. IMPRESSION: Stable reticular densities are noted throughout both lungs most consistent with chronic interstitial scarring or fibrosis, but acute superimposed inflammation or edema cannot be excluded. Electronically Signed   By: Marijo Conception M.D.   On: 01/17/2020 15:51   ECHOCARDIOGRAM COMPLETE  Result Date: 01/18/2020    ECHOCARDIOGRAM REPORT   Patient Name:   NAVEED HUMPHRES [facility] Date of Exam: 01/18/2020 Medical Rec #:  [id number]    Height:       70.0 in Accession #:    [id number]  Weight:       166.4 lb Date of Birth:  1961/03/09     BSA:          1.931 m Patient Age:    21 years     BP:           94/45 mmHg Patient Gender: M            HR:           58 bpm. Exam Location:  Inpatient Procedure: 2D Echo, Color Doppler, Cardiac Doppler and Intracardiac            Opacification Agent Indications:    Elevated Troponins  History:        Patient has prior history of Echocardiogram examinations, most                 recent 03/15/2019. COPD. Prior performed at [facility].  Sonographer:    Raquel Sarna Senior RDCS Referring Phys: [place]  1. Left ventricular ejection fraction, by estimation, is 55 to 60%. The left ventricle has normal function. The left ventricle has no regional wall motion abnormalities. Left ventricular diastolic parameters are consistent with Grade II diastolic dysfunction (pseudonormalization).  2. Right ventricular systolic function is severely reduced. The right ventricular size is severely enlarged.  3. Right atrial size was mildly dilated.  4. The mitral valve is normal in structure. Trivial mitral valve regurgitation.  5. The aortic valve is tricuspid. Aortic valve regurgitation is not visualized.  6. The inferior vena cava is normal in size with greater than 50% respiratory variability, suggesting right atrial pressure of 3 mmHg. Comparison(s): Compared to prior echo  report in 2020 (unable to view images), the RV appears severely enlarged and hypokinetic. FINDINGS  Left Ventricle: Left ventricular ejection fraction, by estimation, is 55 to 60%. The left ventricle has normal function. The left ventricle has no regional wall motion abnormalities. Definity contrast agent was given IV to delineate the left ventricular  endocardial borders. The left ventricular internal cavity size was normal in size. There is no left ventricular hypertrophy. Abnormal (paradoxical) septal motion, consistent with left bundle branch block. Left ventricular diastolic parameters are consistent with Grade II diastolic dysfunction (pseudonormalization). Right Ventricle: The right ventricular size is severely enlarged. Right vetricular wall thickness was not assessed. Right ventricular systolic function is severely reduced. Left Atrium: Left atrial size was normal in size. Right Atrium: Right atrial size was mildly dilated. Pericardium: There is no evidence of pericardial effusion. Mitral Valve: The mitral valve is normal in structure. Trivial mitral valve regurgitation. Tricuspid Valve: The tricuspid valve is normal in structure. Tricuspid valve regurgitation is trivial. Aortic Valve: The aortic valve is tricuspid. Aortic valve regurgitation is not visualized. Pulmonic Valve: The pulmonic valve was grossly normal. Pulmonic valve regurgitation is trivial. Aorta: The aortic root is normal in size and structure. Venous: The inferior vena cava is normal in size with greater than 50% respiratory variability, suggesting right atrial pressure of 3 mmHg. IAS/Shunts: No atrial level shunt detected by color flow Doppler.  LEFT VENTRICLE PLAX 2D LVIDd:         4.90 cm  Diastology LVIDs:         3.40 cm  LV e' medial:    6.96 cm/s LV PW:         0.70 cm  LV E/e' medial:  7.1 LV IVS:        0.70 cm  LV e' lateral:   11.10 cm/s LVOT  diam:     2.00 cm  LV E/e' lateral: 4.5 LV SV:         66 LV SV Index:   34 LVOT Area:      3.14 cm  RIGHT VENTRICLE RV S prime:     9.79 cm/s TAPSE (M-mode): 2.2 cm LEFT ATRIUM             Index       RIGHT ATRIUM           Index LA diam:        3.30 cm 1.71 cm/m  RA Area:     19.70 cm LA Vol (A2C):   49.5 ml 25.64 ml/m RA Volume:   60.80 ml  31.49 ml/m LA Vol (A4C):   62.7 ml 32.48 ml/m LA Biplane Vol: 57.0 ml 29.52 ml/m  AORTIC VALVE LVOT Vmax:   89.70 cm/s LVOT Vmean:  61.200 cm/s LVOT VTI:    0.210 m  AORTA Ao Root diam: 3.10 cm MITRAL VALVE MV Area (PHT): 1.70 cm    SHUNTS MV Decel Time: 447 msec    Systemic VTI:  0.21 m MV E velocity: 49.40 cm/s  Systemic Diam: 2.00 cm MV A velocity: 44.00 cm/s MV E/A ratio:  1.12 Gwyndolyn Kaufman MD Electronically signed by Gwyndolyn Kaufman MD Signature Date/Time: 01/18/2020/2:46:09 PM    Final     EKG: SR chronic RBBB increased QT and precordial T wave inversion No ST elevation    ASSESSMENT AND PLAN:   1. Abnormal Echo:  His has severe RVE/ and RV dysfunction but also RVH. He is a chronic smoker with erythrocytosis Hct 53-56 and CT showing emphysema and ILD. The RVH, lack of chest pain , elevated Hct And lack of troponin bump do not suggest acute RV infarct as well as the fact that there is no inferior RWMA And LV function is normal. Review of echo did not show any obvious shunts. Right sided ECG leads done by nurse and myself also showed no ST elevation Picture suggests more chronic cor pulmonale due to lung disease May need fluid and dopamine to support RV in setting of sepsis and perforated viscus. Trend troponin's but with major surgery just hours ago he is not a candidate to be brought to the cath lab. ECG changes probably do represent acute on chronic RV strain but not from PE or infarct. When more stable or as outpatient he will need pulmonary referral and bubble study can be considered to r/o shunt.  Current antibiotic coverage with Zosyn and Vanc.  Will follow along with you   Case discussed with nurse, CCM attending and  NP  Signed: Jenkins Rouge 01/18/2020, 4:06 PM

## 2020-01-18 NOTE — Consult Note (Signed)
NAME:  Marcus [name], MRN:  [id number], DOB:  [birth year], LOS: 1 ADMISSION DATE:  01/17/2020, CONSULTATION DATE:  01/18/20 REFERRING MD: Dr. Eulis Foster, CHIEF COMPLAINT: Abdominal pain  Brief History   59 year old male that presented with abdominal pain and found to have pneumoperitoneum.  History of present illness   59 year old male that presented to outside hospital for increasing shortness of breath over 1 week.  Per prior records, the patient had 2 weeks of abdominal pain with decreased appetite.  Pain got significantly worse yesterday and came to the emergency room.  Patient had a CT of the abdomen done in the emergency room found to have pneumoperitoneum.  Patient was emergently transferred to [facility] and went directly to the operating room.  Patient was found to have a pyloric channel ulceration and perforation.  He had surgical repair and presented to the intensive care unit postoperatively.  Currently he remains intubated but arousable.  Past Medical History  COPD Rheumatoid arthritis Felty syndrome  Procedures:  Phillip Heal patch with incisional hernia repair, exploratory laparotomy  Significant Diagnostic Tests:  CT abdomen-pneumoperitoneum  Micro Data:  Covid negative Influenza negative  Antimicrobials:  Vancomycin Zosyn  Objective   Blood pressure 107/68, pulse 68, temperature 99.8 F (37.7 C), temperature source Axillary, resp. rate (!) 29, height 5\' 10"  (1.778 m), weight 63.8 kg, SpO2 96 %.    Vent Mode: PRVC FiO2 (%):  [100 %] 100 % Set Rate:  [18 bmp] 18 bmp Vt Set:  [580 mL] 580 mL PEEP:  [5 cmH20] 5 cmH20 Plateau Pressure:  [15 cmH20] 15 cmH20   Intake/Output Summary (Last 24 hours) at 01/18/2020 0241 Last data filed at 01/18/2020 0135 Gross per 24 hour  Intake 1830 ml  Output 400 ml  Net 1430 ml   Filed Weights   01/17/20 1451  Weight: 63.8 kg    Examination: General: No acute distress HENT: Atraumatic/normocephalic, mucous membranes are moist,  orally intubated with endotracheal tube. Lungs: Clear to auscultation bilaterally no wheezing rales or rhonchi noted. Cardiovascular: Regular rate no murmur rub or gallop Abdomen: Midline incision is intact there is a right sided JP drain with serosanguineous fluid.  Bowel sounds are absent.  Abdomen is soft nondistended. Extremities: Distal pulse intact x4.  No cyanosis.  No edema.  Warm to touch Neuro: Patient sedated RASS -1 does interact with his surroundings but slow. 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  We will start spontaneous awakening trials and move towards spontaneous breathing trials.  Patient sounds like he has good air movement at this time.  Rapidly wean FiO2 for oxygen saturations greater than 89%. Patient is currently on propofol infusion will augment with fentanyl infusion. Wean phenylephrine to off to keep MAP greater than 60. N.p.o. Postop labs have been sent. Empiric broad-spectrum antibiotics were initiated and will be continued. Closely monitor I/os. Mildly elevated troponins will order echocardiogram. Patient was on a prednisone Dosepak that completed on December 16, 2019 therefore should not require stress dose steroids.  Best practice:  Diet: N.p.o. Pain/Anxiety/Delirium protocol (if indicated): Propofol/fentanyl VAP protocol (if indicated): Initiated DVT prophylaxis: Lovenox GI prophylaxis: Protonix Glucose control: Monitor blood sugar Mobility: Bedrest Code Status: Full Family Communication:  Disposition: Continue to monitor in the intensive care  unit at this time  Labs   CBC: Recent Labs  Lab 01/17/20 1454 01/18/20 0029   WBC 9.4  --   NEUTROABS 8.2*  --   HGB 19.1* 17.3*  HCT 60.0* 51.0  MCV 88.9  --   PLT 346  --     Basic Metabolic Panel: Recent Labs  Lab 01/17/20 1454 01/18/20 0029  NA 134* 141  K 4.3 4.2  CL 93*  --   CO2 30  --   GLUCOSE 120*  --   BUN 20  --   CREATININE 0.78  --   CALCIUM 8.1*  --    GFR: Estimated Creatinine Clearance: 90.8 mL/min (by C-G formula based on SCr of 0.78 mg/dL). Recent Labs  Lab 01/17/20 1454 01/17/20 1647  PROCALCITON 0.19  --   WBC 9.4  --   LATICACIDVEN 2.1* 1.6    Liver Function Tests: Recent Labs  Lab 01/17/20 1454  AST 22  ALT 9  ALKPHOS 94  BILITOT 1.6*  PROT 8.1  ALBUMIN 2.5*   No results for input(s): LIPASE, AMYLASE in the last 168 hours. No results for input(s): AMMONIA in the last 168 hours.  ABG    Component Value Date/Time   PHART 7.312 (L) 01/18/2020 0029   PCO2ART 57.6 (H) 01/18/2020 0029   PO2ART 104 01/18/2020 0029   HCO3 29.5 (H) 01/18/2020 0029   TCO2 31 01/18/2020 0029   O2SAT 98.0 01/18/2020 0029     Coagulation Profile: No results for input(s): INR, PROTIME in the last 168 hours.  Cardiac Enzymes: No results for input(s): CKTOTAL, CKMB, CKMBINDEX, TROPONINI in the last 168 hours.  HbA1C: Hgb A1c MFr Bld  Date/Time Value Ref Range Status  07/24/2015 01:28 PM  4.0 - 6.0 % Final   UNABLE TO REPORT A1C DUE TO UNKNOWN INTERFERING FACTOR CAUSING THE ANALYTICAL RANGE TO BE OUTSIDE OF ANALYZER RANGE SAMPLE SENT TO [facility] FOR AN ALTERNATIVE METHOD    CBG: No results for input(s): GLUCAP in the last 168 hours.  Review of Systems:   Unable to complete since the patient is intubated.  Past Medical History  He,  has a past medical history of COPD (chronic obstructive pulmonary disease) ([place]), Felty syndrome ([place]), Hernia, epigastric, Pancytopenia ([place]), Seropositive rheumatoid arthritis ([place]), and Tobacco use disorder.   Surgical History   Splenectomy  Social History   reports that he has been  smoking cigarettes. He has been smoking about 1.00 pack per day. He has never used smokeless tobacco. He reports current drug use. Drug: Marijuana. He reports that he does not drink alcohol.   Family History   His family history includes CAD in his brother; COPD in his brother.   Allergies Allergies  Allergen Reactions  . Levaquin [Levofloxacin In D5w] Other (See Comments)    Chest pain  . Methotrexate Other (See Comments)    Chest pain     Home Medications  Prior to Admission medications   Medication Sig Start Date End Date Taking? Authorizing Provider  albuterol (PROVENTIL) (2.5 MG/3ML) 0.083% nebulizer solution Take 3 mLs (2.5 mg total) by nebulization every 6 (six) hours as needed for wheezing or shortness of breath. 08/20/19   Domenic Polite, MD  albuterol (VENTOLIN HFA) 108 (90 Base) MCG/ACT inhaler Inhale 2 puffs into the lungs every 6 (six) hours as needed for wheezing or shortness of breath. 08/20/19   Domenic Polite, MD  azithromycin (ZITHROMAX Z-PAK) 250 MG tablet Take 2 tablets (500 mg) on  Day 1,  followed by 1 tablet (250 mg) once daily on Days 2 through 5. Patient not taking: Reported on 01/17/2020 12/01/19   Carrie Mew, MD  mometasone-formoterol [facility]) 200-5 MCG/ACT AERO Inhale 1 puff into the lungs 2 (two) times daily. 08/20/19   Domenic Polite, MD     Critical care time:12mins

## 2020-01-18 NOTE — Anesthesia Preprocedure Evaluation (Signed)
Anesthesia Evaluation  Patient identified by MRN, date of birth, ID band Patient awake    Reviewed: Allergy & Precautions, H&P , NPO status , Patient's Chart, lab work & pertinent test resultsPreop documentation limited or incomplete due to emergent nature of procedure.  Airway Mallampati: II   Neck ROM: full    Dental   Pulmonary shortness of breath and at rest, COPD, Current Smoker,   Pt with increase respiratory rate and work of breathing.  Currently on nasal cannula, but was on and off BiPAP earlier today.   + decreased breath sounds      Cardiovascular negative cardio ROS   Rhythm:regular Rate:Normal     Neuro/Psych    GI/Hepatic Free air on CT scan. Large ventral hernia   Endo/Other    Renal/GU      Musculoskeletal  (+) Arthritis , Felty syndrome   Abdominal   Peds  Hematology S/p splenectomy 2017   Anesthesia Other Findings   Reproductive/Obstetrics                             Anesthesia Physical Anesthesia Plan  ASA: III and emergent  Anesthesia Plan: General   Post-op Pain Management:    Induction: Intravenous  PONV Risk Score and Plan: 1 and Ondansetron, Dexamethasone, Midazolam and Treatment may vary due to age or medical condition  Airway Management Planned: Oral ETT  Additional Equipment: Arterial line  Intra-op Plan:   Post-operative Plan: Post-operative intubation/ventilation  Informed Consent: I have reviewed the patients History and Physical, chart, labs and discussed the procedure including the risks, benefits and alternatives for the proposed anesthesia with the patient or authorized representative who has indicated his/her understanding and acceptance.       Plan Discussed with: CRNA, Anesthesiologist and Surgeon  Anesthesia Plan Comments:         Anesthesia Quick Evaluation

## 2020-01-19 ENCOUNTER — Inpatient Hospital Stay (HOSPITAL_COMMUNITY): Payer: Medicaid Other

## 2020-01-19 DIAGNOSIS — K251 Acute gastric ulcer with perforation: Secondary | ICD-10-CM | POA: Diagnosis not present

## 2020-01-19 DIAGNOSIS — I2781 Cor pulmonale (chronic): Secondary | ICD-10-CM | POA: Diagnosis not present

## 2020-01-19 LAB — CBC WITH DIFFERENTIAL/PLATELET
Abs Immature Granulocytes: 0.22 10*3/uL — ABNORMAL HIGH (ref 0.00–0.07)
Basophils Absolute: 0 10*3/uL (ref 0.0–0.1)
Basophils Relative: 0 %
Eosinophils Absolute: 0 10*3/uL (ref 0.0–0.5)
Eosinophils Relative: 0 %
HCT: 46.4 % (ref 39.0–52.0)
Hemoglobin: 14.5 g/dL (ref 13.0–17.0)
Immature Granulocytes: 1 %
Lymphocytes Relative: 2 %
Lymphs Abs: 0.5 10*3/uL — ABNORMAL LOW (ref 0.7–4.0)
MCH: 28.6 pg (ref 26.0–34.0)
MCHC: 31.3 g/dL (ref 30.0–36.0)
MCV: 91.5 fL (ref 80.0–100.0)
Monocytes Absolute: 1.1 10*3/uL — ABNORMAL HIGH (ref 0.1–1.0)
Monocytes Relative: 5 %
Neutro Abs: 20.6 10*3/uL — ABNORMAL HIGH (ref 1.7–7.7)
Neutrophils Relative %: 92 %
Platelets: 339 10*3/uL (ref 150–400)
RBC: 5.07 MIL/uL (ref 4.22–5.81)
RDW: 17 % — ABNORMAL HIGH (ref 11.5–15.5)
WBC: 22.4 10*3/uL — ABNORMAL HIGH (ref 4.0–10.5)
nRBC: 0.1 % (ref 0.0–0.2)

## 2020-01-19 LAB — COMPREHENSIVE METABOLIC PANEL
ALT: 8 U/L (ref 0–44)
AST: 17 U/L (ref 15–41)
Albumin: 1.8 g/dL — ABNORMAL LOW (ref 3.5–5.0)
Alkaline Phosphatase: 52 U/L (ref 38–126)
Anion gap: 7 (ref 5–15)
BUN: 17 mg/dL (ref 6–20)
CO2: 26 mmol/L (ref 22–32)
Calcium: 7.4 mg/dL — ABNORMAL LOW (ref 8.9–10.3)
Chloride: 101 mmol/L (ref 98–111)
Creatinine, Ser: 0.51 mg/dL — ABNORMAL LOW (ref 0.61–1.24)
GFR, Estimated: >60 mL/min (ref >60)
Glucose, Bld: 152 mg/dL — ABNORMAL HIGH (ref 70–99)
Potassium: 3.7 mmol/L (ref 3.5–5.1)
Sodium: 134 mmol/L — ABNORMAL LOW (ref 135–145)
Total Bilirubin: 0.8 mg/dL (ref 0.3–1.2)
Total Protein: 5.9 g/dL — ABNORMAL LOW (ref 6.5–8.1)

## 2020-01-19 LAB — SURGICAL PATHOLOGY

## 2020-01-19 LAB — MAGNESIUM: Magnesium: 2.3 mg/dL (ref 1.7–2.4)

## 2020-01-19 IMAGING — DX DG ABDOMEN 1V
1 series · 1 of 1 positions shown · non-contrast
Comparison: CT abdomen pelvis [DATE]

CLINICAL DATA: NG placement

EXAM:
ABDOMEN - 1 VIEW

[abdomen kub]
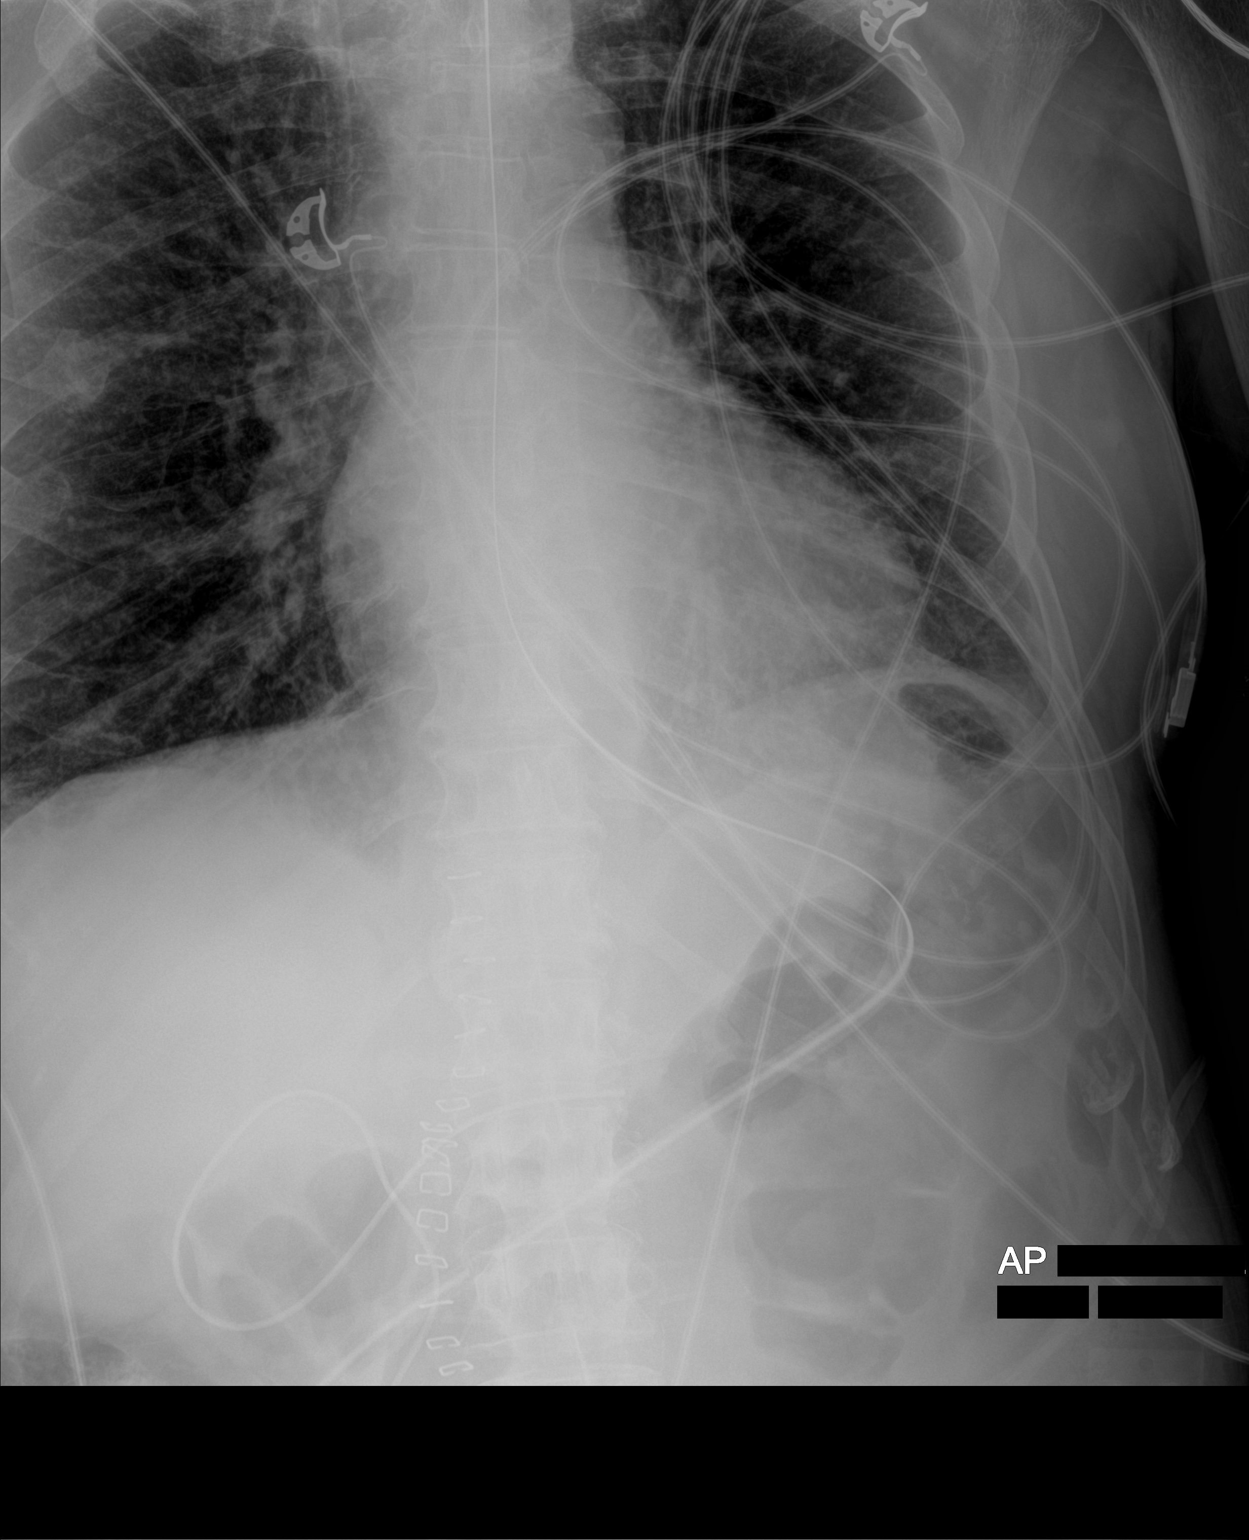

[1 of 1 positions shown; findings below may reference images not displayed]

FINDINGS: NG tube enters the stomach with the tip in the right upper quadrant
likely in the proximal duodenum. Normal bowel gas pattern.

Laparotomy staples in the midline. Surgical drain in the right upper
quadrant.
IMPRESSION: NG tube tip in the region of the duodenum.

## 2020-01-19 MED ORDER — NYSTATIN 100000 UNIT/ML MT SUSP
5.0000 mL | Freq: Four times a day (QID) | OROMUCOSAL | Status: DC
  Administered 2020-01-19 – 2020-01-23 (×9): 500000 [IU] via ORAL
  Filled 2020-01-19 (×11): qty 5

## 2020-01-19 MED ORDER — SODIUM CHLORIDE 0.9 % IV BOLUS
250.0000 mL | Freq: Once | INTRAVENOUS | Status: AC
  Administered 2020-01-19: 250 mL via INTRAVENOUS

## 2020-01-19 MED ORDER — NICOTINE 21 MG/24HR TD PT24
21.0000 mg | MEDICATED_PATCH | Freq: Every day | TRANSDERMAL | Status: DC
  Administered 2020-01-19 – 2020-01-24 (×6): 21 mg via TRANSDERMAL
  Filled 2020-01-19 (×6): qty 1

## 2020-01-19 MED ORDER — ACETAMINOPHEN 10 MG/ML IV SOLN
1000.0000 mg | Freq: Four times a day (QID) | INTRAVENOUS | Status: AC
  Administered 2020-01-19 – 2020-01-20 (×4): 1000 mg via INTRAVENOUS
  Filled 2020-01-19 (×4): qty 100

## 2020-01-19 MED ORDER — HYDRALAZINE HCL 20 MG/ML IJ SOLN
5.0000 mg | INTRAMUSCULAR | Status: DC | PRN
  Administered 2020-01-20: 10 mg via INTRAVENOUS
  Filled 2020-01-19: qty 1

## 2020-01-19 NOTE — Progress Notes (Signed)
Patient right A-line is positional, when repositioned BP stayed above 90's. Recheck BP in automatic monitor for confirmation and resulted to 100's. Pt drowsy but easily awaken and BP rises to 100's. Dopamine gtt kept on hold.

## 2020-01-19 NOTE — Progress Notes (Signed)
2 Days Post-Op  Subjective: NAEO. Pain controlled. Has not been out of bed.  Afebrile, noted hypotension 77/49 last night but per RN note improved with A-line repositioning WBC 22  Objective: Vital signs in last 24 hours: Temp:  [96.3 F (35.7 C)-97.7 F (36.5 C)] 96.8 F (36 C) (10/14 0345) Pulse Rate:  [49-68] 50 (10/14 0730) Resp:  [14-21] 16 (10/14 0730) BP: (77-140)/(49-67) 140/62 (10/14 0730) SpO2:  [85 %-99 %] 99 % (10/14 0730) Arterial Line BP: (85-123)/(40-56) 114/51 (10/14 0730) FiO2 (%):  [40 %] 40 % (10/13 0810) Weight:  [74.1 kg] 74.1 kg (10/14 0500)    Intake/Output from previous day: 10/13 0701 - 10/14 0700 In: 3318.7 [I.V.:1964.1; IV Piggyback:1354.6] Out: 975 [Urine:730; Emesis/NG output:100; Drains:145] Intake/Output this shift: No intake/output data recorded.  PE: General: resting in bed, NAD Neuro: alert, follows commands HEENT:  NG tube in place with dark bilious fluid in cannister CV: HR 59 bpm, regular Resp: extubated, 98% ORA Abdomen: soft, nondistended, midline incision with clean dry dressing in place. JP with serosanguinous drainage. GU: foley in place draining clear yellow urine, 730 cc documented last 24h, about 100 cc in foley bag.  Lab Results:  Recent Labs    01/18/20 0300 01/19/20 0530  WBC 18.1* 22.4*  HGB 16.5 14.5  HCT 53.0* 46.4  PLT 334 339   BMET Recent Labs    01/18/20 0300 01/19/20 0530  NA 134* 134*  K 4.3 3.7  CL 102 101  CO2 26 26  GLUCOSE 164* 152*  BUN 22* 17  CREATININE 0.80 0.51*  CALCIUM 7.1* 7.4*   PT/INR No results for input(s): LABPROT, INR in the last 72 hours. CMP     Component Value Date/Time   NA 134 (L) 01/19/2020 0530   K 3.7 01/19/2020 0530   CL 101 01/19/2020 0530   CO2 26 01/19/2020 0530   GLUCOSE 152 (H) 01/19/2020 0530   BUN 17 01/19/2020 0530   CREATININE 0.51 (L) 01/19/2020 0530   CALCIUM 7.4 (L) 01/19/2020 0530   PROT 5.9 (L) 01/19/2020 0530   ALBUMIN 1.8 (L) 01/19/2020  0530   AST 17 01/19/2020 0530   ALT 8 01/19/2020 0530   ALKPHOS 52 01/19/2020 0530   BILITOT 0.8 01/19/2020 0530   GFRNONAA >60 01/19/2020 0530   GFRAA >60 11/30/2019 1938   Lipase     Component Value Date/Time   LIPASE 52 (H) 11/30/2019 1938       Studies/Results: CT Angio Chest PE W/Cm &/Or Wo Cm  Result Date: 01/17/2020 CLINICAL DATA:  Hypoxia and elevated D-dimer EXAM: CT ANGIOGRAPHY CHEST WITH CONTRAST TECHNIQUE: Multidetector CT imaging of the chest was performed using the standard protocol during bolus administration of intravenous contrast. Multiplanar CT image reconstructions and MIPs were obtained to evaluate the vascular anatomy. CONTRAST:  166mL OMNIPAQUE IOHEXOL 350 MG/ML SOLN COMPARISON:  Chest x-ray from earlier in the same day, CT from 11/14/2015 FINDINGS: Cardiovascular: Thoracic aorta demonstrates atherosclerotic calcification without aneurysmal dilatation or dissection. No cardiac enlargement is noted. Coronary calcifications are seen. Pulmonary artery demonstrates a normal branching pattern bilaterally. No filling defect to suggest pulmonary embolism is noted. Mediastinum/Nodes: Thoracic inlet is within normal limits. The esophagus is within normal limits. Scattered small hilar and mediastinal adenopathy is noted Lungs/Pleura: Thelungs are well aerated bilaterally and demonstrate diffuse reticulonodular and tree-in-bud changes consistent with multifocal inflammatory change. No focal confluent infiltrate is noted. Mild bronchial thickening is seen as well. No sizable parenchymal nodule is noted. No  effusion or pneumothorax is seen. Diffuse emphysematous changes are noted. Upper Abdomen: Visualized upper abdomen demonstrates evidence of free air as well as a large ventral hernia with loops of transverse colon within. This does not appear to be obstructive in nature although no CT of the abdomen and pelvis is recommended. The free air is somewhat centered around the stomach  which may be related to a perforated gastric ulcer. Again dedicated abdominal CT is recommended. Musculoskeletal: Degenerative changes of the thoracic spine are noted. Old rib fractures with healing are seen on the right. Review of the MIP images confirms the above findings. IMPRESSION: Diffuse reticulonodular and tree-in-bud changes consistent with acute atypical pneumonia. No focal confluent infiltrate is noted. No evidence of pulmonary emboli. Free air within the abdomen with some thickening of the gastric wall and surrounding inflammatory change suspicious for perforated gastric ulcer. Dedicated CT of the abdomen and pelvis is recommended. Large ventral hernia containing transverse colon although no definitive obstructive changes are seen. This would also be well evaluated on a CT of the abdomen and pelvis. Aortic Atherosclerosis (ICD10-I70.0) and Emphysema (ICD10-J43.9). Critical Value/emergent results were called by telephone at the time of interpretation on 01/17/2020 at 5:54 pm to Dr. Daleen Bo , who verbally acknowledged these results. Electronically Signed   By: Inez Catalina M.D.   On: 01/17/2020 17:55   CT Abdomen Pelvis W Contrast  Result Date: 01/17/2020 CLINICAL DATA:  Peritonitis.  Possible pneumoperitoneum. EXAM: CT ABDOMEN AND PELVIS WITH CONTRAST TECHNIQUE: Multidetector CT imaging of the abdomen and pelvis was performed using the standard protocol following bolus administration of intravenous contrast. CONTRAST:  4mL OMNIPAQUE IOHEXOL 300 MG/ML  SOLN COMPARISON:  CT of same day.  Aug 19, 2019. FINDINGS: Lower chest: Stable reticular opacity seen in visualized lung bases consistent with atypical inflammation. Hepatobiliary: Minimal cholelithiasis is noted. No biliary dilatation is noted. Liver is unremarkable. Pancreas: Unremarkable. No pancreatic ductal dilatation or surrounding inflammatory changes. Spleen: Status post splenectomy. Adrenals/Urinary Tract: Adrenal glands are  unremarkable. Kidneys are normal, without renal calculi, focal lesion, or hydronephrosis. Bladder is unremarkable. Stomach/Bowel: The appendix appears normal. Wall thickening and surrounding inflammatory changes are seen involving the distal stomach concerning for peptic ulcer disease. Large epigastric ventral hernia is again noted which contains a loop of transverse colon; there is noted dilatation of this portion of the transverse colon. Free air is noted in the epigastric region in the right subdiaphragmatic space and epigastric region as well as in within the hernia, consistent with rupture of hollow viscus. Vascular/Lymphatic: Atherosclerosis of abdominal aorta is noted. Stable 3.1 cm infrarenal abdominal aortic aneurysm is noted. No adenopathy is noted. Reproductive: Prostate is unremarkable. Other: Moderate size fat containing right inguinal hernia is noted. No ascites is noted. Musculoskeletal: No acute or significant osseous findings. IMPRESSION: 1. Free air is noted in the epigastric region, in the right subdiaphragmatic space and within the hernia, consistent with rupture of hollow viscus. Wall thickening and surrounding inflammatory changes are seen involving the distal stomach concerning for peptic ulcer disease. Critical Value/emergent results were called by telephone at the time of interpretation on 01/17/2020 at 7:57 pm to provider [facility] , who verbally acknowledged these results. 2. Large epigastric ventral hernia is again noted which contains a loop of transverse colon; there is noted dilatation of this portion of the transverse colon. 3. Minimal cholelithiasis is noted. 4. Stable 3.1 cm infrarenal abdominal aortic aneurysm. Recommend follow-up ultrasound every 3 years. This recommendation follows ACR consensus guidelines: White  Paper of the ACR Incidental Findings Committee II on Vascular Findings. J Am Coll Radiol 2013; 10:789-794. 5. Moderate size fat containing right inguinal hernia. 6.  Stable reticular opacity is seen in visualized lung bases consistent with atypical inflammation. 7. Aortic atherosclerosis. Aortic Atherosclerosis (ICD10-I70.0). Electronically Signed   By: Marijo Conception M.D.   On: 01/17/2020 19:58   DG Chest Port 1 View  Result Date: 01/17/2020 CLINICAL DATA:  Shortness of breath. EXAM: PORTABLE CHEST 1 VIEW COMPARISON:  November 30, 2019. FINDINGS: The heart size and mediastinal contours are within normal limits. No pneumothorax or pleural effusion is noted. Stable reticular densities are noted throughout both lungs most consistent with chronic interstitial scarring or fibrosis, but acute superimposed inflammation or edema cannot be excluded. The visualized skeletal structures are unremarkable. IMPRESSION: Stable reticular densities are noted throughout both lungs most consistent with chronic interstitial scarring or fibrosis, but acute superimposed inflammation or edema cannot be excluded. Electronically Signed   By: Marijo Conception M.D.   On: 01/17/2020 15:51   ECHOCARDIOGRAM COMPLETE  Result Date: 01/18/2020    ECHOCARDIOGRAM REPORT   Patient Name:   FRANCE NOYCE [facility] Date of Exam: 01/18/2020 Medical Rec #:  [id number]    Height:       70.0 in Accession #:    [id number]   Weight:       166.4 lb Date of Birth:  02/10/1961     BSA:          1.931 m Patient Age:    21 years     BP:           94/45 mmHg Patient Gender: M            HR:           58 bpm. Exam Location:  Inpatient Procedure: 2D Echo, Color Doppler, Cardiac Doppler and Intracardiac            Opacification Agent Indications:    Elevated Troponins  History:        Patient has prior history of Echocardiogram examinations, most                 recent 03/15/2019. COPD. Prior performed at [facility].  Sonographer:    Raquel Sarna Senior RDCS Referring Phys: [place]  1. Left ventricular ejection fraction, by estimation, is 55 to 60%. The left ventricle has normal function. The left ventricle has no  regional wall motion abnormalities. Left ventricular diastolic parameters are consistent with Grade II diastolic dysfunction (pseudonormalization).  2. Right ventricular systolic function is severely reduced. The right ventricular size is severely enlarged.  3. Right atrial size was mildly dilated.  4. The mitral valve is normal in structure. Trivial mitral valve regurgitation.  5. The aortic valve is tricuspid. Aortic valve regurgitation is not visualized.  6. The inferior vena cava is normal in size with greater than 50% respiratory variability, suggesting right atrial pressure of 3 mmHg. Comparison(s): Compared to prior echo report in 2020 (unable to view images), the RV appears severely enlarged and hypokinetic. FINDINGS  Left Ventricle: Left ventricular ejection fraction, by estimation, is 55 to 60%. The left ventricle has normal function. The left ventricle has no regional wall motion abnormalities. Definity contrast agent was given IV to delineate the left ventricular  endocardial borders. The left ventricular internal cavity size was normal in size. There is no left ventricular hypertrophy. Abnormal (paradoxical) septal motion, consistent with left bundle branch block. Left ventricular  diastolic parameters are consistent with Grade II diastolic dysfunction (pseudonormalization). Right Ventricle: The right ventricular size is severely enlarged. Right vetricular wall thickness was not assessed. Right ventricular systolic function is severely reduced. Left Atrium: Left atrial size was normal in size. Right Atrium: Right atrial size was mildly dilated. Pericardium: There is no evidence of pericardial effusion. Mitral Valve: The mitral valve is normal in structure. Trivial mitral valve regurgitation. Tricuspid Valve: The tricuspid valve is normal in structure. Tricuspid valve regurgitation is trivial. Aortic Valve: The aortic valve is tricuspid. Aortic valve regurgitation is not visualized. Pulmonic Valve: The  pulmonic valve was grossly normal. Pulmonic valve regurgitation is trivial. Aorta: The aortic root is normal in size and structure. Venous: The inferior vena cava is normal in size with greater than 50% respiratory variability, suggesting right atrial pressure of 3 mmHg. IAS/Shunts: No atrial level shunt detected by color flow Doppler.  LEFT VENTRICLE PLAX 2D LVIDd:         4.90 cm  Diastology LVIDs:         3.40 cm  LV e' medial:    6.96 cm/s LV PW:         0.70 cm  LV E/e' medial:  7.1 LV IVS:        0.70 cm  LV e' lateral:   11.10 cm/s LVOT diam:     2.00 cm  LV E/e' lateral: 4.5 LV SV:         66 LV SV Index:   34 LVOT Area:     3.14 cm  RIGHT VENTRICLE RV S prime:     9.79 cm/s TAPSE (M-mode): 2.2 cm LEFT ATRIUM             Index       RIGHT ATRIUM           Index LA diam:        3.30 cm 1.71 cm/m  RA Area:     19.70 cm LA Vol (A2C):   49.5 ml 25.64 ml/m RA Volume:   60.80 ml  31.49 ml/m LA Vol (A4C):   62.7 ml 32.48 ml/m LA Biplane Vol: 57.0 ml 29.52 ml/m  AORTIC VALVE LVOT Vmax:   89.70 cm/s LVOT Vmean:  61.200 cm/s LVOT VTI:    0.210 m  AORTA Ao Root diam: 3.10 cm MITRAL VALVE MV Area (PHT): 1.70 cm    SHUNTS MV Decel Time: 447 msec    Systemic VTI:  0.21 m MV E velocity: 49.40 cm/s  Systemic Diam: 2.00 cm MV A velocity: 44.00 cm/s MV E/A ratio:  1.12 Gwyndolyn Kaufman MD Electronically signed by Gwyndolyn Kaufman MD Signature Date/Time: 01/18/2020/2:46:09 PM    Final     Anti-infectives: Anti-infectives (From admission, onward)   Start     Dose/Rate Route Frequency Ordered Stop   01/18/20 0800  vancomycin (VANCOREADY) IVPB 750 mg/150 mL  Status:  Discontinued        750 mg 150 mL/hr over 60 Minutes Intravenous Every 12 hours 01/17/20 1833 01/18/20 1004   01/18/20 0030  piperacillin-tazobactam (ZOSYN) IVPB 3.375 g        3.375 g 12.5 mL/hr over 240 Minutes Intravenous Every 8 hours 01/17/20 2350     01/17/20 2330  piperacillin-tazobactam (ZOSYN) IVPB 3.375 g  Status:  Discontinued         3.375 g 100 mL/hr over 30 Minutes Intravenous Every 8 hours 01/17/20 2324 01/17/20 2348   01/17/20 1845  vancomycin (VANCOREADY) IVPB 1500 mg/300 mL  1,500 mg 150 mL/hr over 120 Minutes Intravenous  Once 01/17/20 1820 01/18/20 0130   01/17/20 1800  piperacillin-tazobactam (ZOSYN) IVPB 3.375 g        3.375 g 100 mL/hr over 30 Minutes Intravenous  Once 01/17/20 1800 01/17/20 1858       Assessment/Plan pneumoperitoneum Perforated pre-pyloric ulcer POD2 s/p ex lap, hernia repair and Graham patch repair of perforated prepyloric ulcer. - follow surgical path - Strict NPO, NG tube to low intermittent suction - Continue IV fluid hydration - IV antibiotics with gram negative coverage  FEN: NPO, IVF, NG to LIWS ID: Zosyn 10/12 >>  VTE: SCD's, Lovenox  Foley: remove today, TOV Dispo: SDU, NPO    LOS: 2 days   Michaelle Birks, MD [facility] Surgery General, Hepatobiliary and Pancreatic Surgery 01/19/20 8:05 AM

## 2020-01-19 NOTE — Progress Notes (Signed)
[place] Progress Note Patient Name: ESSA MALACHI DOB: 28-Sep-1960 MRN: [id number]   Date of Service  01/19/2020  HPI/Events of Note  Patient improved but still hypotensive despite increasing  Dopamine infusion to 5 mcg.  eICU Interventions  NS bolus 250 ml over 1 hour x 1 ordered.        Kerry Kass Enas Winchel 01/19/2020, 2:40 AM

## 2020-01-19 NOTE — Progress Notes (Signed)
[place] Progress Note Patient Name: Marcus [name] DOB: [birth year] MRN: [id number]   Date of Service  01/19/2020  HPI/Events of Note  Blood pressure and heart rate remain sub-optimal.  eICU Interventions  Dopamine infusion increased to 5 mcg /kg /min.        Marcus [name] U [name] 01/19/2020, 1:22 AM

## 2020-01-19 NOTE — Progress Notes (Signed)
Dr. Vaughan Browner notified by this RN that patient's tongue was coated white. Orders for nystatin received. Nicotine patch order also placed by CCM per patient's request. This RN will continue to monitor.

## 2020-01-19 NOTE — Progress Notes (Signed)
Subjective:  Doing well trying ice chips Mild hypotension bradycardia over night  Getting oral care from nurse  Objective:  Vitals:   01/19/20 0730 01/19/20 0800 01/19/20 0830 01/19/20 0900  BP: 140/62 (!) 138/57 (!) 154/69 (!) 141/64  Pulse: (!) 50 (!) 47 (!) 54 (!) 48  Resp: 16 16 (!) 22 17  Temp:  97.8 F (36.6 C)    TempSrc:  Oral    SpO2: 99% 96% 94% 94%  Weight:      Height:        Intake/Output from previous day:  Intake/Output Summary (Last 24 hours) at 01/19/2020 0939 Last data filed at 01/19/2020 0549 Gross per 24 hour  Intake 3208.9 ml  Output 955 ml  Net 2253.9 ml    Physical Exam: Chronically ill Bronchitic male Poor dentition NG tube to suction  Post abdominal surgery with dry dressing Diffuse rhonchi chronic cough No edema Normal heart sounds  Lab Results: Basic Metabolic Panel: Recent Labs    01/18/20 0300 01/19/20 0530  NA 134* 134*  K 4.3 3.7  CL 102 101  CO2 26 26  GLUCOSE 164* 152*  BUN 22* 17  CREATININE 0.80 0.51*  CALCIUM 7.1* 7.4*  MG 1.9 2.3  PHOS 5.2*  --    Liver Function Tests: Recent Labs    01/17/20 1454 01/19/20 0530  AST 22 17  ALT 9 8  ALKPHOS 94 52  BILITOT 1.6* 0.8  PROT 8.1 5.9*  ALBUMIN 2.5* 1.8*   No results for input(s): LIPASE, AMYLASE in the last 72 hours. CBC: Recent Labs    01/17/20 1454 01/18/20 0029 01/18/20 0300 01/19/20 0530  WBC 9.4  --  18.1* 22.4*  NEUTROABS 8.2*  --   --  20.6*  HGB 19.1*   < > 16.5 14.5  HCT 60.0*   < > 53.0* 46.4  MCV 88.9  --  90.8 91.5  PLT 346  --  334 339   < > = values in this interval not displayed.   Cardiac Enzymes: No results for input(s): CKTOTAL, CKMB, CKMBINDEX, TROPONINI in the last 72 hours. BNP: Invalid input(s): POCBNP D-Dimer: Recent Labs    01/17/20 1454  DDIMER 3.69*   Hemoglobin A1C: No results for input(s): HGBA1C in the last 72 hours. Fasting Lipid Panel: Recent Labs    01/17/20 1454  TRIG 142   Thyroid Function  Tests: No results for input(s): TSH, T4TOTAL, T3FREE, THYROIDAB in the last 72 hours.  Invalid input(s): FREET3 Anemia Panel: Recent Labs    01/17/20 1454  FERRITIN 376*    Imaging: DG Abd 1 View  Result Date: 01/19/2020 CLINICAL DATA:  NG placement EXAM: ABDOMEN - 1 VIEW COMPARISON:  CT abdomen pelvis 01/17/2020 FINDINGS: NG tube enters the stomach with the tip in the right upper quadrant likely in the proximal duodenum. Normal bowel gas pattern. Laparotomy staples in the midline. Surgical drain in the right upper quadrant. IMPRESSION: NG tube tip in the region of the duodenum. Electronically Signed   By: Franchot Gallo M.D.   On: 01/19/2020 09:25   CT Angio Chest PE W/Cm &/Or Wo Cm  Result Date: 01/17/2020 CLINICAL DATA:  Hypoxia and elevated D-dimer EXAM: CT ANGIOGRAPHY CHEST WITH CONTRAST TECHNIQUE: Multidetector CT imaging of the chest was performed using the standard protocol during bolus administration of intravenous contrast. Multiplanar CT image reconstructions and MIPs were obtained to evaluate the vascular anatomy. CONTRAST:  137mL OMNIPAQUE IOHEXOL 350 MG/ML SOLN COMPARISON:  Chest x-ray from  earlier in the same day, CT from 11/14/2015 FINDINGS: Cardiovascular: Thoracic aorta demonstrates atherosclerotic calcification without aneurysmal dilatation or dissection. No cardiac enlargement is noted. Coronary calcifications are seen. Pulmonary artery demonstrates a normal branching pattern bilaterally. No filling defect to suggest pulmonary embolism is noted. Mediastinum/Nodes: Thoracic inlet is within normal limits. The esophagus is within normal limits. Scattered small hilar and mediastinal adenopathy is noted Lungs/Pleura: Thelungs are well aerated bilaterally and demonstrate diffuse reticulonodular and tree-in-bud changes consistent with multifocal inflammatory change. No focal confluent infiltrate is noted. Mild bronchial thickening is seen as well. No sizable parenchymal nodule is  noted. No effusion or pneumothorax is seen. Diffuse emphysematous changes are noted. Upper Abdomen: Visualized upper abdomen demonstrates evidence of free air as well as a large ventral hernia with loops of transverse colon within. This does not appear to be obstructive in nature although no CT of the abdomen and pelvis is recommended. The free air is somewhat centered around the stomach which may be related to a perforated gastric ulcer. Again dedicated abdominal CT is recommended. Musculoskeletal: Degenerative changes of the thoracic spine are noted. Old rib fractures with healing are seen on the right. Review of the MIP images confirms the above findings. IMPRESSION: Diffuse reticulonodular and tree-in-bud changes consistent with acute atypical pneumonia. No focal confluent infiltrate is noted. No evidence of pulmonary emboli. Free air within the abdomen with some thickening of the gastric wall and surrounding inflammatory change suspicious for perforated gastric ulcer. Dedicated CT of the abdomen and pelvis is recommended. Large ventral hernia containing transverse colon although no definitive obstructive changes are seen. This would also be well evaluated on a CT of the abdomen and pelvis. Aortic Atherosclerosis (ICD10-I70.0) and Emphysema (ICD10-J43.9). Critical Value/emergent results were called by telephone at the time of interpretation on 01/17/2020 at 5:54 pm to Dr. Daleen Bo , who verbally acknowledged these results. Electronically Signed   By: Inez Catalina M.D.   On: 01/17/2020 17:55   CT Abdomen Pelvis W Contrast  Result Date: 01/17/2020 CLINICAL DATA:  Peritonitis.  Possible pneumoperitoneum. EXAM: CT ABDOMEN AND PELVIS WITH CONTRAST TECHNIQUE: Multidetector CT imaging of the abdomen and pelvis was performed using the standard protocol following bolus administration of intravenous contrast. CONTRAST:  66mL OMNIPAQUE IOHEXOL 300 MG/ML  SOLN COMPARISON:  CT of same day.  Aug 19, 2019. FINDINGS:  Lower chest: Stable reticular opacity seen in visualized lung bases consistent with atypical inflammation. Hepatobiliary: Minimal cholelithiasis is noted. No biliary dilatation is noted. Liver is unremarkable. Pancreas: Unremarkable. No pancreatic ductal dilatation or surrounding inflammatory changes. Spleen: Status post splenectomy. Adrenals/Urinary Tract: Adrenal glands are unremarkable. Kidneys are normal, without renal calculi, focal lesion, or hydronephrosis. Bladder is unremarkable. Stomach/Bowel: The appendix appears normal. Wall thickening and surrounding inflammatory changes are seen involving the distal stomach concerning for peptic ulcer disease. Large epigastric ventral hernia is again noted which contains a loop of transverse colon; there is noted dilatation of this portion of the transverse colon. Free air is noted in the epigastric region in the right subdiaphragmatic space and epigastric region as well as in within the hernia, consistent with rupture of hollow viscus. Vascular/Lymphatic: Atherosclerosis of abdominal aorta is noted. Stable 3.1 cm infrarenal abdominal aortic aneurysm is noted. No adenopathy is noted. Reproductive: Prostate is unremarkable. Other: Moderate size fat containing right inguinal hernia is noted. No ascites is noted. Musculoskeletal: No acute or significant osseous findings. IMPRESSION: 1. Free air is noted in the epigastric region, in the right subdiaphragmatic space and  within the hernia, consistent with rupture of hollow viscus. Wall thickening and surrounding inflammatory changes are seen involving the distal stomach concerning for peptic ulcer disease. Critical Value/emergent results were called by telephone at the time of interpretation on 01/17/2020 at 7:57 pm to provider [facility] , who verbally acknowledged these results. 2. Large epigastric ventral hernia is again noted which contains a loop of transverse colon; there is noted dilatation of this portion of the  transverse colon. 3. Minimal cholelithiasis is noted. 4. Stable 3.1 cm infrarenal abdominal aortic aneurysm. Recommend follow-up ultrasound every 3 years. This recommendation follows ACR consensus guidelines: White Paper of the ACR Incidental Findings Committee II on Vascular Findings. J Am Coll Radiol 2013; 10:789-794. 5. Moderate size fat containing right inguinal hernia. 6. Stable reticular opacity is seen in visualized lung bases consistent with atypical inflammation. 7. Aortic atherosclerosis. Aortic Atherosclerosis (ICD10-I70.0). Electronically Signed   By: Marijo Conception M.D.   On: 01/17/2020 19:58   DG Chest Port 1 View  Result Date: 01/17/2020 CLINICAL DATA:  Shortness of breath. EXAM: PORTABLE CHEST 1 VIEW COMPARISON:  November 30, 2019. FINDINGS: The heart size and mediastinal contours are within normal limits. No pneumothorax or pleural effusion is noted. Stable reticular densities are noted throughout both lungs most consistent with chronic interstitial scarring or fibrosis, but acute superimposed inflammation or edema cannot be excluded. The visualized skeletal structures are unremarkable. IMPRESSION: Stable reticular densities are noted throughout both lungs most consistent with chronic interstitial scarring or fibrosis, but acute superimposed inflammation or edema cannot be excluded. Electronically Signed   By: Marijo Conception M.D.   On: 01/17/2020 15:51   ECHOCARDIOGRAM COMPLETE  Result Date: 01/18/2020    ECHOCARDIOGRAM REPORT   Patient Name:   Marcus [name] [facility] Date of Exam: 01/18/2020 Medical Rec #:  [id number]    Height:       70.0 in Accession #:    [id number]   Weight:       166.4 lb Date of Birth:  09-03-1960     BSA:          1.931 m Patient Age:    59 years     BP:           94/45 mmHg Patient Gender: M            HR:           58 bpm. Exam Location:  Inpatient Procedure: 2D Echo, Color Doppler, Cardiac Doppler and Intracardiac            Opacification Agent Indications:    Elevated  Troponins  History:        Patient has prior history of Echocardiogram examinations, most                 recent 03/15/2019. COPD. Prior performed at [facility].  Sonographer:    Raquel Sarna Senior RDCS Referring Phys: [place]  1. Left ventricular ejection fraction, by estimation, is 55 to 60%. The left ventricle has normal function. The left ventricle has no regional wall motion abnormalities. Left ventricular diastolic parameters are consistent with Grade II diastolic dysfunction (pseudonormalization).  2. Right ventricular systolic function is severely reduced. The right ventricular size is severely enlarged.  3. Right atrial size was mildly dilated.  4. The mitral valve is normal in structure. Trivial mitral valve regurgitation.  5. The aortic valve is tricuspid. Aortic valve regurgitation is not visualized.  6. The inferior vena cava is normal  in size with greater than 50% respiratory variability, suggesting right atrial pressure of 3 mmHg. Comparison(s): Compared to prior echo report in 2020 (unable to view images), the RV appears severely enlarged and hypokinetic. FINDINGS  Left Ventricle: Left ventricular ejection fraction, by estimation, is 55 to 60%. The left ventricle has normal function. The left ventricle has no regional wall motion abnormalities. Definity contrast agent was given IV to delineate the left ventricular  endocardial borders. The left ventricular internal cavity size was normal in size. There is no left ventricular hypertrophy. Abnormal (paradoxical) septal motion, consistent with left bundle branch block. Left ventricular diastolic parameters are consistent with Grade II diastolic dysfunction (pseudonormalization). Right Ventricle: The right ventricular size is severely enlarged. Right vetricular wall thickness was not assessed. Right ventricular systolic function is severely reduced. Left Atrium: Left atrial size was normal in size. Right Atrium: Right atrial size was  mildly dilated. Pericardium: There is no evidence of pericardial effusion. Mitral Valve: The mitral valve is normal in structure. Trivial mitral valve regurgitation. Tricuspid Valve: The tricuspid valve is normal in structure. Tricuspid valve regurgitation is trivial. Aortic Valve: The aortic valve is tricuspid. Aortic valve regurgitation is not visualized. Pulmonic Valve: The pulmonic valve was grossly normal. Pulmonic valve regurgitation is trivial. Aorta: The aortic root is normal in size and structure. Venous: The inferior vena cava is normal in size with greater than 50% respiratory variability, suggesting right atrial pressure of 3 mmHg. IAS/Shunts: No atrial level shunt detected by color flow Doppler.  LEFT VENTRICLE PLAX 2D LVIDd:         4.90 cm  Diastology LVIDs:         3.40 cm  LV e' medial:    6.96 cm/s LV PW:         0.70 cm  LV E/e' medial:  7.1 LV IVS:        0.70 cm  LV e' lateral:   11.10 cm/s LVOT diam:     2.00 cm  LV E/e' lateral: 4.5 LV SV:         66 LV SV Index:   34 LVOT Area:     3.14 cm  RIGHT VENTRICLE RV S prime:     9.79 cm/s TAPSE (M-mode): 2.2 cm LEFT ATRIUM             Index       RIGHT ATRIUM           Index LA diam:        3.30 cm 1.71 cm/m  RA Area:     19.70 cm LA Vol (A2C):   49.5 ml 25.64 ml/m RA Volume:   60.80 ml  31.49 ml/m LA Vol (A4C):   62.7 ml 32.48 ml/m LA Biplane Vol: 57.0 ml 29.52 ml/m  AORTIC VALVE LVOT Vmax:   89.70 cm/s LVOT Vmean:  61.200 cm/s LVOT VTI:    0.210 m  AORTA Ao Root diam: 3.10 cm MITRAL VALVE MV Area (PHT): 1.70 cm    SHUNTS MV Decel Time: 447 msec    Systemic VTI:  0.21 m MV E velocity: 49.40 cm/s  Systemic Diam: 2.00 cm MV A velocity: 44.00 cm/s MV E/A ratio:  1.12 Gwyndolyn Kaufman MD Electronically signed by Gwyndolyn Kaufman MD Signature Date/Time: 01/18/2020/2:46:09 PM    Final     Cardiac Studies:  ECG: SR RBBB precordial T wave inversions no right sided lead ST elevation   Telemetry:  NSR PACls   Echo: Normal LVEF 60-65% severe  RVE/hypokinesis and RVH  Medications:   . Chlorhexidine Gluconate Cloth  6 each Topical Daily  . enoxaparin (LOVENOX) injection  40 mg Subcutaneous Q24H  . ipratropium-albuterol  3 mL Nebulization Q6H  . lidocaine  1 patch Transdermal Q24H  . mouth rinse  15 mL Mouth Rinse BID  . pantoprazole (PROTONIX) IV  40 mg Intravenous Q12H     . acetaminophen    . dextrose 5 % and 0.45% NaCl 100 mL/hr at 01/19/20 0621  . DOPamine Stopped (01/19/20 0415)  . methocarbamol (ROBAXIN) IV Stopped (01/19/20 0549)  . piperacillin-tazobactam (ZOSYN)  IV 3.375 g (01/19/20 0751)    Assessment/Plan:   1. Chronic Cor Pulmonale:  No evidence of acute ischemic event Troponin flat and max 59.  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  Will need outpatient pulmonary f/u consider home oxygen , PFTls And walk test. He has had some fluid to keep right sided pressures up and prevent hypotension Has not needed dopamine  2. GI:  Perforated ulcer doing suprisingly well. NG tube ? Need for TPN on Zada Finders 01/19/2020, 9:39 AM

## 2020-01-19 NOTE — Progress Notes (Signed)
[facility] Progress Note Patient Name: Marcus [name] DOB: January 25, 1961 MRN: [id number]   Date of Service  01/19/2020  HPI/Events of Note  Patient with elevated blood pressure, he is getting iv fluids.  eICU Interventions   iv fluids discontinued, PRN Hydralazine ordered for high blood pressure.        Kerry Kass Joleigh Mineau 01/19/2020, 11:56 PM

## 2020-01-19 NOTE — Progress Notes (Signed)
Upon entering the room, this RN found NGT to be partially pulled out. Re-advanced by this RN to original markings at 44 cm. STAT KUB ordered per protocol. Tube clamped at this time until X-Ray confirms proper placement.

## 2020-01-19 NOTE — Progress Notes (Signed)
Based off XR, surgical PA recommended pulling NGT back 10 cm. PA did not want repeat KUB. NG tube now measures 52 cm from right nare. This RN will continue to monitor.

## 2020-01-19 NOTE — Progress Notes (Addendum)
   NAME:  MARIEL GAUDIN, MRN:  [id number], DOB:  1961/03/14, LOS: 2 ADMISSION DATE:  01/17/2020, CONSULTATION DATE:  01/18/20 REFERRING MD: Dr. Eulis Foster, CHIEF COMPLAINT: Abdominal pain  Brief History   59 year old male that presented 10/12 with abdominal pain and found to have pneumoperitoneum secondary to pyloric channel ulceration and perforation.  S/p Ex lap repair. PCCM consulted for vent management  Past Medical History  COPD Rheumatoid arthritis Felty syndrome  Procedures:  10/12 Graham patch with incisional hernia repair, exploratory laparotomy  Significant Diagnostic Tests:  10/12 CT Abdomen >> Pneumoperitoneum.   Micro Data:  COVID 10/12 >> negative  Influenza A/B 10/12 >> negative  MRSA PCR 10/12 >> negative BCx2 10/12 >>   Antimicrobials:  Vancomycin 10/12 >> Zosyn 10/12 >>   Objective   Blood pressure (!) 142/70, pulse (!) 49, temperature (!) 97.5 F (36.4 C), temperature source Oral, resp. rate (!) 21, height 5\' 10"  (1.778 m), weight 74.1 kg, SpO2 94 %.        Intake/Output Summary (Last 24 hours) at 01/19/2020 1257 Last data filed at 01/19/2020 1223 Gross per 24 hour  Intake 3599.79 ml  Output 1440 ml  Net 2159.79 ml   Filed Weights   01/17/20 1451 01/18/20 0350 01/19/20 0500  Weight: 63.8 kg 75.5 kg 74.1 kg    Examination: General: adult male lying in bed in NAD HEENT: MM pink/dry, NGT, anicteric Neuro: asleep upon entering the room, awakens to voice, speech clear, MAE CV: s1s2 RRR, no m/r/g PULM: non-labored on [place] O2, lungs bilaterally clear anterior, crackles left base GI: soft, midline surgical incision with waffle dressing, right lower JP drain with serosanguinous drainage Extremities: warm/dry, no edema  Skin: no rashes or lesions  Assessment & Plan:   Acute respiratory failure - extubated, tolerating well  Pneumoperitoneum 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 Status post exploratory laparotomy with Phillip Heal patch repair and  incisional hernia repair COPD Felty syndrome Pancytopenia Rheumatoid arthritis Active smoker Bradycardia RVE / RV Dysfunction Erythrocytosis  Chronic Cor Pulmonale secondary to Obstructive Lung Disease  Mild Hyponatremia   Plan:  Push pulmonary hygiene measures - IS, mobilize, OOB  -continue broad spectrum abx  -appreciate Cardiology evaluation  -post-operative / wound care per CCS  -defer nutrition to CCS  -tele monitoring  -follow CBC, BMP -monitor intermittent CXR  -follow cultures to maturity  -assess ambulatory O2 needs prior to discharge  -consider outpatient pulmonary follow up for PFT evaluation, 6 min walk  Best practice:  Diet: NPO. Pain/Anxiety/Delirium protocol (if indicated): pain control per primary  VAP protocol (if indicated): n/a DVT prophylaxis: Lovenox GI prophylaxis: Protonix Glucose control: Follow blood sugar Mobility: Bedrest Code Status: Full Code Family Communication: per primary.  Patient updated at bedside 10/14  Disposition: SDU   PCCM will be available PRN.  Please call back if new needs arise.   Critical care time:    Noe Gens, MSN, NP-C, AGACNP-BC [facility] 01/19/2020, 2:11 PM   Please see [email or web address] for pager details.

## 2020-01-19 NOTE — Progress Notes (Signed)
eLink Physician-Brief Progress Note Patient Name: Marcus [name] DOB: 1960/06/22 MRN: [id number]   Date of Service  01/19/2020  HPI/Events of Note  Patient needs a.m. labs.  eICU Interventions   a.m. labs ordered.        Kerry Kass Angelito Hopping 01/19/2020, 5:21 AM

## 2020-01-19 NOTE — Anesthesia Postprocedure Evaluation (Signed)
Anesthesia Post Note  Patient: Marcus [name]  Procedure(s) Performed: EXPLORATORY LAPAROTOMY (N/A Abdomen) HERNIA REPAIR INCISIONAL AND GRAHAM PATCH (Abdomen)     Patient location during evaluation: SICU Anesthesia Type: General Level of consciousness: sedated Pain management: pain level controlled Vital Signs Assessment: post-procedure vital signs reviewed and stable Respiratory status: patient remains intubated per anesthesia plan Cardiovascular status: stable Postop Assessment: no apparent nausea or vomiting Anesthetic complications: no   No complications documented.  Last Vitals:  Vitals:   01/19/20 1200 01/19/20 1230  BP: (!) 163/76 (!) 142/70  Pulse: (!) 59 (!) 49  Resp: 18 (!) 21  Temp: (!) 36.4 C   SpO2: 95% 94%    Last Pain:  Vitals:   01/19/20 1200  TempSrc: Oral  PainSc: [place]

## 2020-01-20 ENCOUNTER — Inpatient Hospital Stay (HOSPITAL_COMMUNITY): Payer: Medicaid Other

## 2020-01-20 DIAGNOSIS — M0500 Felty's syndrome, unspecified site: Secondary | ICD-10-CM | POA: Diagnosis not present

## 2020-01-20 DIAGNOSIS — E876 Hypokalemia: Secondary | ICD-10-CM

## 2020-01-20 DIAGNOSIS — I2781 Cor pulmonale (chronic): Secondary | ICD-10-CM | POA: Diagnosis not present

## 2020-01-20 DIAGNOSIS — K251 Acute gastric ulcer with perforation: Secondary | ICD-10-CM | POA: Diagnosis not present

## 2020-01-20 DIAGNOSIS — J9601 Acute respiratory failure with hypoxia: Secondary | ICD-10-CM

## 2020-01-20 DIAGNOSIS — J441 Chronic obstructive pulmonary disease with (acute) exacerbation: Secondary | ICD-10-CM

## 2020-01-20 DIAGNOSIS — Z9081 Acquired absence of spleen: Secondary | ICD-10-CM

## 2020-01-20 DIAGNOSIS — R6521 Severe sepsis with septic shock: Secondary | ICD-10-CM

## 2020-01-20 DIAGNOSIS — K668 Other specified disorders of peritoneum: Secondary | ICD-10-CM

## 2020-01-20 DIAGNOSIS — A419 Sepsis, unspecified organism: Secondary | ICD-10-CM

## 2020-01-20 LAB — BASIC METABOLIC PANEL
Anion gap: 8 (ref 5–15)
BUN: 10 mg/dL (ref 6–20)
CO2: 27 mmol/L (ref 22–32)
Calcium: 7.6 mg/dL — ABNORMAL LOW (ref 8.9–10.3)
Chloride: 106 mmol/L (ref 98–111)
Creatinine, Ser: 0.46 mg/dL — ABNORMAL LOW (ref 0.61–1.24)
GFR, Estimated: >60 mL/min (ref >60)
Glucose, Bld: 71 mg/dL (ref 70–99)
Potassium: 3.2 mmol/L — ABNORMAL LOW (ref 3.5–5.1)
Sodium: 141 mmol/L (ref 135–145)

## 2020-01-20 LAB — CBC
HCT: 52.8 % — ABNORMAL HIGH (ref 39.0–52.0)
Hemoglobin: 16.5 g/dL (ref 13.0–17.0)
MCH: 28.2 pg (ref 26.0–34.0)
MCHC: 31.3 g/dL (ref 30.0–36.0)
MCV: 90.3 fL (ref 80.0–100.0)
Platelets: 415 10*3/uL — ABNORMAL HIGH (ref 150–400)
RBC: 5.85 MIL/uL — ABNORMAL HIGH (ref 4.22–5.81)
RDW: 17.8 % — ABNORMAL HIGH (ref 11.5–15.5)
WBC: 12.4 10*3/uL — ABNORMAL HIGH (ref 4.0–10.5)
nRBC: 0.9 % — ABNORMAL HIGH (ref 0.0–0.2)

## 2020-01-20 LAB — MAGNESIUM: Magnesium: 2 mg/dL (ref 1.7–2.4)

## 2020-01-20 IMAGING — DX DG CHEST 1V PORT
1 series · 1 of 1 positions shown · non-contrast
Comparison: Radiographs [DATE] and [DATE].  CT [DATE].

CLINICAL DATA: Hypoxia.

EXAM:
PORTABLE CHEST 1 VIEW

[chest ap]
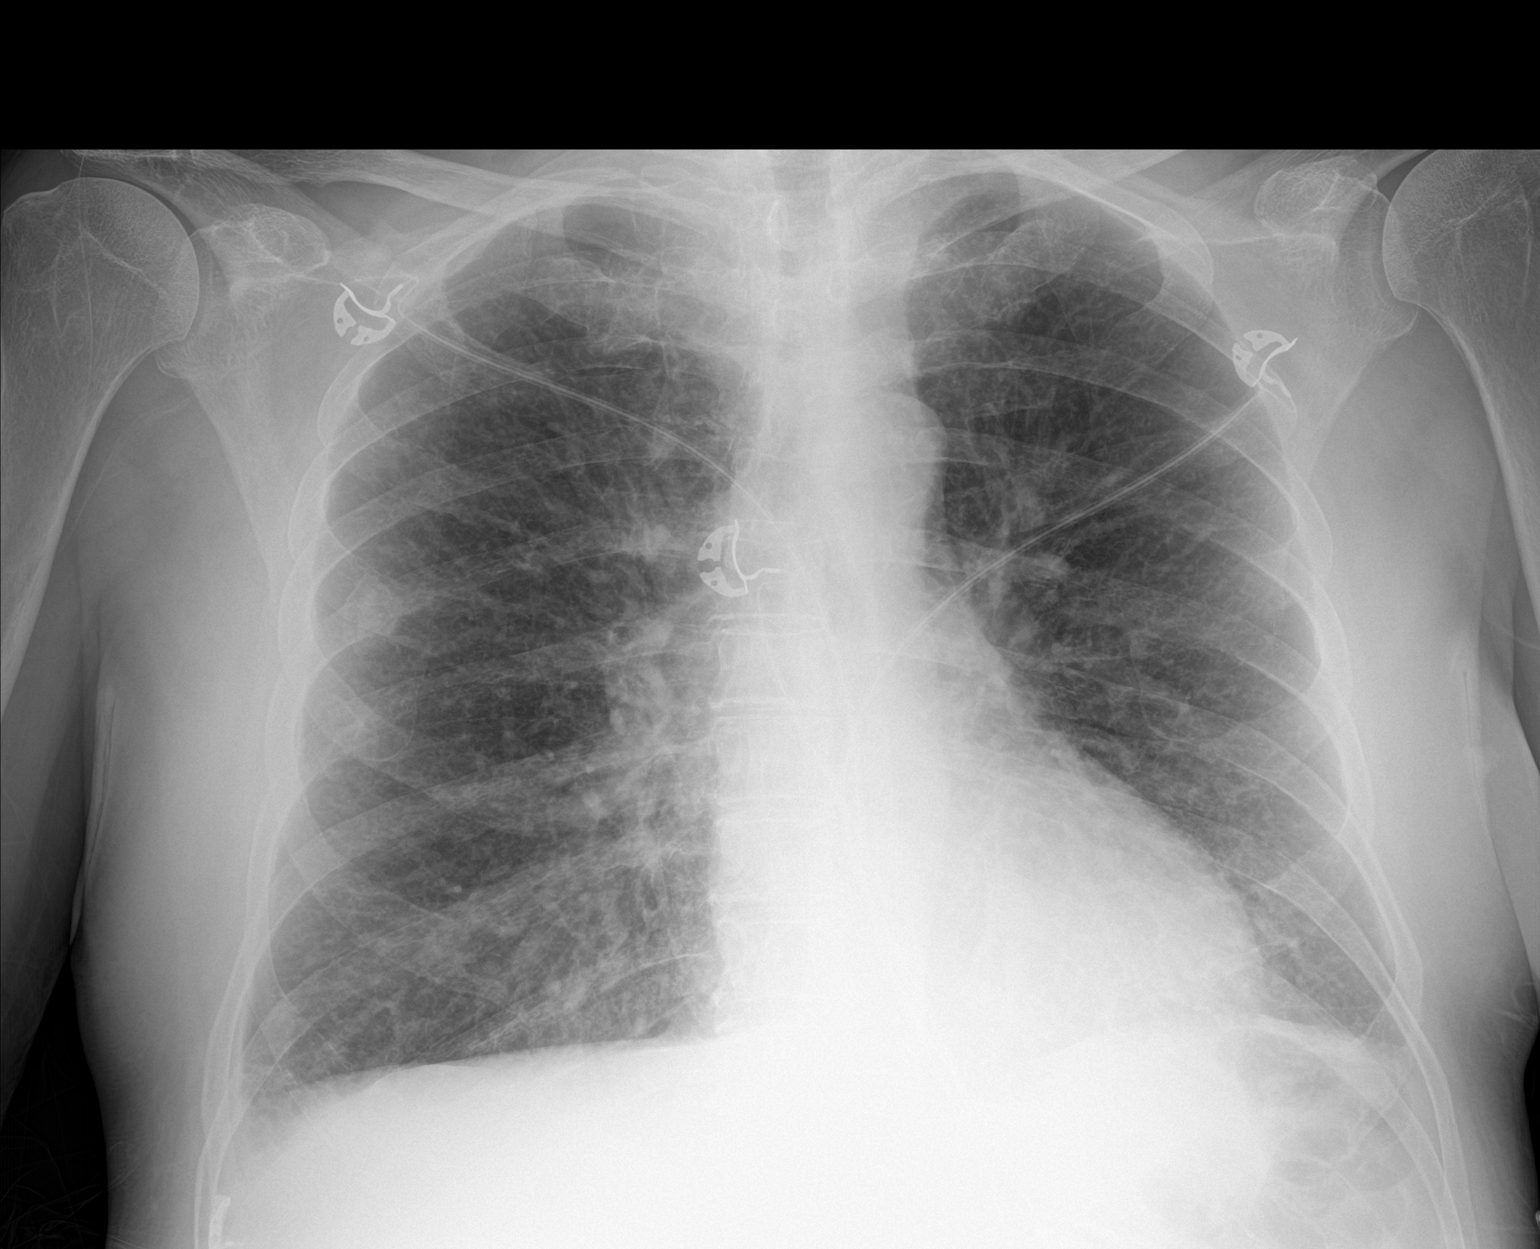

[1 of 1 positions shown; findings below may reference images not displayed]

FINDINGS: [XN] hours. The heart size and mediastinal contours are stable.
Diffuse interstitial prominence seen on the most recent studies has
improved, associated with central airway thickening and tree-in-bud
nodularity on CT. There is no confluent airspace opacity, pleural
effusion or pneumothorax. Old rib fracture noted on the right.
Telemetry leads overlie the chest.
IMPRESSION: Interval improvement in diffuse interstitial prominence suggesting
improving atypical infection or inflammation. No focal airspace
disease.

## 2020-01-20 MED ORDER — POTASSIUM CHLORIDE 10 MEQ/100ML IV SOLN
10.0000 meq | INTRAVENOUS | Status: AC
  Administered 2020-01-20 (×2): 10 meq via INTRAVENOUS
  Filled 2020-01-20 (×2): qty 100

## 2020-01-20 MED ORDER — MOMETASONE FURO-FORMOTEROL FUM 200-5 MCG/ACT IN AERO
1.0000 | INHALATION_SPRAY | Freq: Two times a day (BID) | RESPIRATORY_TRACT | Status: DC
  Administered 2020-01-20 – 2020-01-24 (×8): 1 via RESPIRATORY_TRACT
  Filled 2020-01-20: qty 8.8

## 2020-01-20 NOTE — Progress Notes (Signed)
Subjective:  No cardiac complaints  Taking ice chips and sprite  NG tube out  Objective:  Vitals:   01/20/20 0500 01/20/20 0800 01/20/20 0819 01/20/20 0838  BP:  (!) 156/79    Pulse:  68    Resp:  (!) 24    Temp:   97.8 F (36.6 C)   TempSrc:   Oral   SpO2:  93%  94%  Weight: 71.6 kg     Height:        Intake/Output from previous day:  Intake/Output Summary (Last 24 hours) at 01/20/2020 0859 Last data filed at 01/20/2020 8101 Gross per 24 hour  Intake 2070.19 ml  Output 1760 ml  Net 310.19 ml    Physical Exam: Chronically ill Bronchitic male Poor dentition Post abdominal surgery with dry dressing Diffuse rhonchi chronic cough No edema Normal heart sounds  Lab Results: Basic Metabolic Panel: Recent Labs    01/18/20 0300 01/18/20 0300 01/19/20 0530 01/20/20 0537  NA 134*   < > 134* 141  K 4.3   < > 3.7 3.2*  CL 102   < > 101 106  CO2 26   < > 26 27  GLUCOSE 164*   < > 152* 71  BUN 22*   < > 17 10  CREATININE 0.80   < > 0.51* 0.46*  CALCIUM 7.1*   < > 7.4* 7.6*  MG 1.9  --  2.3  --   PHOS 5.2*  --   --   --    < > = values in this interval not displayed.   Liver Function Tests: Recent Labs    01/17/20 1454 01/19/20 0530  AST 22 17  ALT 9 8  ALKPHOS 94 52  BILITOT 1.6* 0.8  PROT 8.1 5.9*  ALBUMIN 2.5* 1.8*   No results for input(s): LIPASE, AMYLASE in the last 72 hours. CBC: Recent Labs    01/17/20 1454 01/18/20 0029 01/19/20 0530 01/20/20 0537  WBC 9.4   < > 22.4* 12.4*  NEUTROABS 8.2*  --  20.6*  --   HGB 19.1*   < > 14.5 16.5  HCT 60.0*   < > 46.4 52.8*  MCV 88.9   < > 91.5 90.3  PLT 346   < > 339 415*   < > = values in this interval not displayed.   Cardiac Enzymes: No results for input(s): CKTOTAL, CKMB, CKMBINDEX, TROPONINI in the last 72 hours. BNP: Invalid input(s): POCBNP D-Dimer: Recent Labs    01/17/20 1454  DDIMER 3.69*   Hemoglobin A1C: No results for input(s): HGBA1C in the last 72 hours. Fasting Lipid  Panel: Recent Labs    01/17/20 1454  TRIG 142   Thyroid Function Tests: No results for input(s): TSH, T4TOTAL, T3FREE, THYROIDAB in the last 72 hours.  Invalid input(s): FREET3 Anemia Panel: Recent Labs    01/17/20 1454  FERRITIN 376*    Imaging: DG Abd 1 View  Result Date: 01/19/2020 CLINICAL DATA:  NG placement EXAM: ABDOMEN - 1 VIEW COMPARISON:  CT abdomen pelvis 01/17/2020 FINDINGS: NG tube enters the stomach with the tip in the right upper quadrant likely in the proximal duodenum. Normal bowel gas pattern. Laparotomy staples in the midline. Surgical drain in the right upper quadrant. IMPRESSION: NG tube tip in the region of the duodenum. Electronically Signed   By: Franchot Gallo M.D.   On: 01/19/2020 09:25   ECHOCARDIOGRAM COMPLETE  Result Date: 01/18/2020    ECHOCARDIOGRAM REPORT  Patient Name:   JASPAL PULTZ [facility] Date of Exam: 01/18/2020 Medical Rec #:  [id number]    Height:       70.0 in Accession #:    [id number]   Weight:       166.4 lb Date of Birth:  1960-10-11     BSA:          1.931 m Patient Age:    59 years     BP:           94/45 mmHg Patient Gender: M            HR:           58 bpm. Exam Location:  Inpatient Procedure: 2D Echo, Color Doppler, Cardiac Doppler and Intracardiac            Opacification Agent Indications:    Elevated Troponins  History:        Patient has prior history of Echocardiogram examinations, most                 recent 03/15/2019. COPD. Prior performed at [facility].  Sonographer:    Raquel Sarna Senior RDCS Referring Phys: [place]  1. Left ventricular ejection fraction, by estimation, is 55 to 60%. The left ventricle has normal function. The left ventricle has no regional wall motion abnormalities. Left ventricular diastolic parameters are consistent with Grade II diastolic dysfunction (pseudonormalization).  2. Right ventricular systolic function is severely reduced. The right ventricular size is severely enlarged.  3. Right atrial  size was mildly dilated.  4. The mitral valve is normal in structure. Trivial mitral valve regurgitation.  5. The aortic valve is tricuspid. Aortic valve regurgitation is not visualized.  6. The inferior vena cava is normal in size with greater than 50% respiratory variability, suggesting right atrial pressure of 3 mmHg. Comparison(s): Compared to prior echo report in 2020 (unable to view images), the RV appears severely enlarged and hypokinetic. FINDINGS  Left Ventricle: Left ventricular ejection fraction, by estimation, is 55 to 60%. The left ventricle has normal function. The left ventricle has no regional wall motion abnormalities. Definity contrast agent was given IV to delineate the left ventricular  endocardial borders. The left ventricular internal cavity size was normal in size. There is no left ventricular hypertrophy. Abnormal (paradoxical) septal motion, consistent with left bundle branch block. Left ventricular diastolic parameters are consistent with Grade II diastolic dysfunction (pseudonormalization). Right Ventricle: The right ventricular size is severely enlarged. Right vetricular wall thickness was not assessed. Right ventricular systolic function is severely reduced. Left Atrium: Left atrial size was normal in size. Right Atrium: Right atrial size was mildly dilated. Pericardium: There is no evidence of pericardial effusion. Mitral Valve: The mitral valve is normal in structure. Trivial mitral valve regurgitation. Tricuspid Valve: The tricuspid valve is normal in structure. Tricuspid valve regurgitation is trivial. Aortic Valve: The aortic valve is tricuspid. Aortic valve regurgitation is not visualized. Pulmonic Valve: The pulmonic valve was grossly normal. Pulmonic valve regurgitation is trivial. Aorta: The aortic root is normal in size and structure. Venous: The inferior vena cava is normal in size with greater than 50% respiratory variability, suggesting right atrial pressure of 3 mmHg.  IAS/Shunts: No atrial level shunt detected by color flow Doppler.  LEFT VENTRICLE PLAX 2D LVIDd:         4.90 cm  Diastology LVIDs:         3.40 cm  LV e' medial:    6.96 cm/s LV  PW:         0.70 cm  LV E/e' medial:  7.1 LV IVS:        0.70 cm  LV e' lateral:   11.10 cm/s LVOT diam:     2.00 cm  LV E/e' lateral: 4.5 LV SV:         66 LV SV Index:   34 LVOT Area:     3.14 cm  RIGHT VENTRICLE RV S prime:     9.79 cm/s TAPSE (M-mode): 2.2 cm LEFT ATRIUM             Index       RIGHT ATRIUM           Index LA diam:        3.30 cm 1.71 cm/m  RA Area:     19.70 cm LA Vol (A2C):   49.5 ml 25.64 ml/m RA Volume:   60.80 ml  31.49 ml/m LA Vol (A4C):   62.7 ml 32.48 ml/m LA Biplane Vol: 57.0 ml 29.52 ml/m  AORTIC VALVE LVOT Vmax:   89.70 cm/s LVOT Vmean:  61.200 cm/s LVOT VTI:    0.210 m  AORTA Ao Root diam: 3.10 cm MITRAL VALVE MV Area (PHT): 1.70 cm    SHUNTS MV Decel Time: 447 msec    Systemic VTI:  0.21 m MV E velocity: 49.40 cm/s  Systemic Diam: 2.00 cm MV A velocity: 44.00 cm/s MV E/A ratio:  1.12 Gwyndolyn Kaufman MD Electronically signed by Gwyndolyn Kaufman MD Signature Date/Time: 01/18/2020/2:46:09 PM    Final     Cardiac Studies:  ECG: SR RBBB precordial T wave inversions no right sided lead ST elevation   Telemetry:  NSR PACls   Echo: Normal LVEF 60-65% severe RVE/hypokinesis and RVH  Medications:   . Chlorhexidine Gluconate Cloth  6 each Topical Daily  . enoxaparin (LOVENOX) injection  40 mg Subcutaneous Q24H  . ipratropium-albuterol  3 mL Nebulization Q6H  . lidocaine  1 patch Transdermal Q24H  . mouth rinse  15 mL Mouth Rinse BID  . nicotine  21 mg Transdermal Daily  . nystatin  5 mL Oral QID  . pantoprazole (PROTONIX) IV  40 mg Intravenous Q12H     . methocarbamol (ROBAXIN) IV Stopped (01/20/20 5364)  . piperacillin-tazobactam (ZOSYN)  IV 12.5 mL/hr at 01/20/20 0838    Assessment/Plan:   1. Chronic Cor Pulmonale:  No evidence of acute ischemic event Troponin flat and max 59.   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  Will need outpatient pulmonary f/u consider home oxygen , PFTls And walk test. He has had some fluid to keep right sided pressures up and prevent hypotension Has not needed dopamine  2. GI:  Perforated ulcer doing suprisingly well. NG tube ? Need for TPN on Zosyn   Cardiology will sign off   Jenkins Rouge 01/20/2020, 8:59 AM

## 2020-01-20 NOTE — Evaluation (Signed)
Occupational Therapy Evaluation Patient Details Name: IRISH PIECH MRN: [id number] DOB: Mar 29, 1961 Today's Date: 01/20/2020    History of Present Illness NAVY BELAY is an 59 y.o. 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 CT abdomen/pelvis with contrast revealed a perforated viscus with pneumoperitoneum.He was taken to the OR for exploratory laparotomy by Dr. Kieth Brightly on the evening of 10/12 and admitted to the ICU following the surgery. 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 The patient was extubated on 10/13 and presents now on 2 L.   Clinical Impression   Mr. 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 Patient required min assist for transfers and ambulation with RW,  mod assist for LB ADLs, set up and seated position for UB ADLs. Patient will benefit from skilled OT services to improve deficits and learn compensatory strategies for ADLs as needed in order to return at discharge.     Follow Up Recommendations  Home health OT    Equipment Recommendations  Tub/shower seat    Recommendations for Other Services       Precautions / Restrictions Precautions Precautions: Fall Precaution Comments: art line in right wrist, abdominal incision Restrictions Weight Bearing Restrictions: No      Mobility Bed Mobility Overal bed mobility: Needs Assistance Bed Mobility: Supine to Sit     Supine to sit: HOB elevated;Min assist     General bed mobility comments: hand hold to pull up into sitting and  assist of bed bad to pivot to edge  Transfers Overall transfer level: Needs assistance Equipment used: Rolling walker (2 wheeled) Transfers: Sit to/from [facility] Sit to Stand: Min assist Stand pivot transfers: Min assist       General transfer comment: min assist for steadying, ambulated in hall with RW on 2L    Balance Overall balance assessment: Mild deficits observed, not formally tested                                         ADL either performed or assessed with clinical judgement   ADL Overall ADL's : Needs assistance/impaired Eating/Feeding: Independent   Grooming: Set up;Wash/dry hands;Wash/dry face;Sitting   Upper Body Bathing: Set up;Sitting   Lower Body Bathing: Moderate assistance;Set up;Sit to/from stand   Upper Body Dressing : Minimal assistance;Set up;Sitting   Lower Body Dressing: Moderate assistance;Set up;Sit to/from stand   Toilet Transfer: BSC;Ambulation;RW;Minimal assistance   Toileting- Clothing Manipulation and Hygiene: Minimal assistance;Sit to/from stand       Functional mobility during ADLs: Minimal assistance;Rolling walker       Vision Patient Visual Report: No change from baseline       Perception     Praxis      Pertinent Vitals/Pain Pain Assessment: 0-10 Pain Score: 10-Worst pain ever Pain Location: generalized pain Pain Descriptors / Indicators: Grimacing Pain Intervention(s): Patient requesting pain meds-RN notified;Limited activity within patient's tolerance     Hand Dominance (P)  Left   Extremity/Trunk Assessment Upper Extremity Assessment Upper Extremity Assessment: RUE deficits/detail;LUE deficits/detail RUE Deficits / Details: Arthritic changes in right hand with less than functional ROM, otherwise functional ROM and strength of upper extremity. RUE Coordination: decreased fine motor (secondary to arthritic changes) LUE Deficits / Details: WFL ROM and strength   Lower  Extremity Assessment Lower Extremity Assessment: Defer to PT evaluation   Cervical / Trunk Assessment Cervical / Trunk Assessment: Normal   Communication Communication Communication: (P) No difficulties   Cognition Arousal/Alertness: Awake/alert Behavior During Therapy: WFL for tasks assessed/performed Overall Cognitive Status: Within Functional Limits for tasks assessed                                     General Comments       Exercises     Shoulder Instructions      Home Living Family/patient expects to be discharged to:: (P) Private residence Living Arrangements: (P) Parent Available Help at Discharge: (P) Family Type of Home: (P) Mobile home Home Access: (P) Stairs to enter Entrance Stairs-Number of Steps: (P) 4 Entrance Stairs-Rails: (P) Right;Left;Can reach both Home Layout: (P) One level     Bathroom Shower/Tub: (P) Tub/shower unit;Walk-in shower   Bathroom Toilet: (P) Standard     Home Equipment: (P) Walker - 2 wheels;Cane - single point          Prior Functioning/Environment Level of Independence: (P) Independent                 OT Problem List: Decreased strength;Decreased activity tolerance;Impaired balance (sitting and/or standing);Decreased knowledge of use of DME or AE;Cardiopulmonary status limiting activity;Pain      OT Treatment/Interventions: Self-care/ADL training;Therapeutic exercise;DME and/or AE instruction;Patient/family education;Balance training;Therapeutic activities    OT Goals(Current goals can be found in the care plan section) Acute Rehab OT Goals Patient Stated Goal: improve independence OT Goal Formulation: With patient Time For Goal Achievement: 02/03/20 Potential to Achieve Goals: Good  OT Frequency: Min 2X/week   Barriers to D/C:            Co-evaluation PT/OT/SLP Co-Evaluation/Treatment: Yes Reason for Co-Treatment: For patient/therapist safety;To address functional/ADL transfers   OT goals  addressed during session: ADL's and self-care      AM-PAC OT "6 Clicks" Daily Activity     Outcome Measure Help from another person eating meals?: None Help from another person taking care of personal grooming?: A Little Help from another person toileting, which includes using toliet, bedpan, or urinal?: A Little Help from another person bathing (including washing, rinsing, drying)?: A Lot Help from another person to put on and taking off regular upper body clothing?: A Little Help from another person to put on and taking off regular lower body clothing?: A Lot 6 Click Score: 17   End of Session Equipment Utilized During Treatment: Rolling walker;Oxygen Nurse Communication: Mobility status  Activity Tolerance: Patient limited by fatigue Patient left: in chair;with call bell/phone within reach;with nursing/sitter in room  OT Visit Diagnosis: Unsteadiness on feet (R26.81);Muscle weakness (generalized) (M62.81);Pain Pain - part of body:  (generalized)                Time: 2671-2458 OT Time Calculation (min): 36 min Charges:  OT General Charges $OT Visit: 1 Visit OT Evaluation $OT Eval Moderate Complexity: 1 Mod  Sharlie Shreffler, OTR/L [place]  Office [phone] Pager: [place] 01/20/2020, 4:32  PM

## 2020-01-20 NOTE — Evaluation (Signed)
Physical Therapy Evaluation Patient Details Name: Marcus [name] MRN: [id number] DOB: 23-Jan-1961 Today's Date: 01/20/2020   History of Present Illness  Marcus [name] is an 58 y.o. 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 CT abdomen/pelvis with contrast revealed a perforated viscus with pneumoperitoneum.He was taken to the OR for exploratory laparotomy by Dr. Kieth Brightly on the evening of 10/12 and admitted to the ICU following the surgery. 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 The patient was extubated on 10/13 and presents now on 2 L.  Clinical Impression  The patient did ambulated x 80' using RW. Patient with frequent coughing. SPO2 on 2 L >88%. Patient did tolerate well. Patient hopefully wil progress to Dc home. Lives with mother who is 41.  Pt admitted with above diagnosis.  Pt currently with functional limitations due to the deficits listed below (see PT Problem List). Pt will benefit from skilled PT to increase their independence and safety with mobility to allow discharge to the venue listed below.       Follow Up Recommendations No PT follow up    Equipment Recommendations  None recommended by PT    Recommendations for Other Services       Precautions / Restrictions Precautions Precautions: Fall Precaution Comments: art line in right wrist, abdominal incision, r JP drain Restrictions Weight Bearing Restrictions: No      Mobility  Bed Mobility Overal bed mobility: Needs Assistance Bed Mobility: Supine to Sit     Supine to sit: HOB elevated;Min assist     General bed mobility comments: hand hold to pull up into sitting and assist of bed bad to pivot to edge  Transfers Overall transfer level: Needs  assistance Equipment used: Rolling walker (2 wheeled) Transfers: Sit to/from [facility] Sit to Stand: Min assist Stand pivot transfers: Min assist       General transfer comment: min assist for steadying, ambulated in hall with RW on 2L  Ambulation/Gait Ambulation/Gait assistance: Min guard;+2 safety/equipment Gait Distance (Feet): 80 Feet Assistive device: Rolling walker (2 wheeled) Gait Pattern/deviations: Step-through pattern     General Gait Details: decreased speed,  Stairs            Wheelchair Mobility    Modified Rankin (Stroke Patients Only)       Balance Overall balance assessment: Mild deficits observed, not formally tested                                           Pertinent Vitals/Pain Pain Assessment: 0-10 Pain Score: 10-Worst pain ever Pain Location: generalized pain Pain Descriptors / Indicators: Grimacing Pain Intervention(s): Monitored during session;Premedicated before session    Home Living Family/patient expects to be discharged to:: Private residence Living Arrangements: Parent Available Help at Discharge: Family Type of Home: House Home Access: Stairs to enter Entrance Stairs-Rails: Right;Left;Can reach both Technical brewer of Steps: 4 Home Layout: One level Home Equipment: Environmental consultant - 2 wheels;Cane - single point      Prior Function Level of Independence: Independent               Hand Dominance  Dominant Hand: Left    Extremity/Trunk Assessment   Upper Extremity Assessment Upper Extremity Assessment: Defer to OT evaluation RUE Deficits / Details: Arthritic changes in right hand with less than functional ROM, otherwise functional ROM and strength of upper extremity. RUE Coordination: decreased fine motor (secondary to arthritic changes) LUE Deficits / Details: WFL ROM and strength    Lower Extremity Assessment Lower Extremity Assessment: Generalized weakness    Cervical /  Trunk Assessment Cervical / Trunk Assessment: Normal  Communication   Communication: (P) No difficulties  Cognition Arousal/Alertness: Awake/alert Behavior During Therapy: WFL for tasks assessed/performed Overall Cognitive Status: Within Functional Limits for tasks assessed                                        General Comments      Exercises     Assessment/Plan    PT Assessment Patient needs continued PT services  PT Problem List Decreased strength;Decreased activity tolerance;Decreased mobility;Decreased knowledge of use of DME       PT Treatment Interventions DME instruction;Gait training;Functional mobility training;Therapeutic activities;Therapeutic exercise;Patient/family education    PT Goals (Current goals can be found in the Care Plan section)  Acute Rehab PT Goals Patient Stated Goal: improve independence PT Goal Formulation: With patient Time For Goal Achievement: 02/03/20 Potential to Achieve Goals: Good    Frequency Min 3X/week   Barriers to discharge        Co-evaluation PT/OT/SLP Co-Evaluation/Treatment: Yes Reason for Co-Treatment: For patient/therapist safety PT goals addressed during session: Mobility/safety with mobility OT goals addressed during session: ADL's and self-care       AM-PAC PT "6 Clicks" Mobility  Outcome Measure Help needed turning from your back to your side while in a flat bed without using bedrails?: A Little Help needed moving from lying on your back to sitting on the side of a flat bed without using bedrails?: A Little Help needed moving to and from a bed to a chair (including a wheelchair)?: A Little Help needed standing up from a chair using your arms (e.g., wheelchair or bedside chair)?: A Little Help needed to walk in hospital room?: A Little Help needed climbing 3-5 steps with a railing? : A Lot 6 Click Score: 17    End of Session   Activity Tolerance: Patient limited by fatigue;Patient tolerated  treatment well Patient left: in chair;with call bell/phone within reach;with chair alarm set;with nursing/sitter in room Nurse Communication: Mobility status PT Visit Diagnosis: Unsteadiness on feet (R26.81);Difficulty in walking, not elsewhere classified (R26.2);Pain    Time: 1424-1501 PT Time Calculation (min) (ACUTE ONLY): 37 min   Charges:   PT Evaluation $PT Eval Moderate Complexity: 1 Mod PT Treatments $Gait Training: 8-22 mins        Tresa Endo PT Acute Rehabilitation Services Pager [phone] Office [phone]   Claretha Cooper 01/20/2020, 5:04 PM

## 2020-01-20 NOTE — Progress Notes (Signed)
3 Days Post-Op  Subjective: Hypertensive overnight. WBC downtrending. Good UOP. No nausea/vomiting, minimal NG output.  Objective: Vital signs in last 24 hours: Temp:  [97.4 F (36.3 C)-97.8 F (36.6 C)] 97.4 F (36.3 C) (10/15 0400) Pulse Rate:  [45-81] 81 (10/15 0400) Resp:  [14-29] 29 (10/15 0100) BP: (103-174)/(44-84) 142/58 (10/15 0400) SpO2:  [90 %-99 %] 96 % (10/15 0400) Arterial Line BP: (85-175)/(44-77) 153/58 (10/15 0400) Weight:  [71.6 kg] 71.6 kg (10/15 0500) Last BM Date:  (PTA)  Intake/Output from previous day: 10/14 0701 - 10/15 0700 In: 1917.3 [I.V.:1340.9; IV Piggyback:571.3] Out: 2355 [Urine:1585; Emesis/NG output:10; Drains:95] Intake/Output this shift: Total I/O In: 652.6 [I.V.:358.8; IV Piggyback:293.7] Out: 750 [Urine:700; Drains:50]  PE: General: resting comfortably, NAD Neuro: alert and oriented, no focal deficits HEENT: NG in place with minimal gastric contents Resp: normal work of breathing on nasal cannula CV: RRR Abdomen: soft, nondistended, nontender to palpation. Midline incision clean and dry with honeycomb dressing in place. JP with serosanguinous drainage. Extremities: warm and well-perfused   Lab Results:  Recent Labs    01/19/20 0530 01/20/20 0537  WBC 22.4* 12.4*  HGB 14.5 16.5  HCT 46.4 52.8*  PLT 339 415*   BMET Recent Labs    01/18/20 0300 01/19/20 0530  NA 134* 134*  K 4.3 3.7  CL 102 101  CO2 26 26  GLUCOSE 164* 152*  BUN 22* 17  CREATININE 0.80 0.51*  CALCIUM 7.1* 7.4*   PT/INR No results for input(s): LABPROT, INR in the last 72 hours. CMP     Component Value Date/Time   NA 134 (L) 01/19/2020 0530   K 3.7 01/19/2020 0530   CL 101 01/19/2020 0530   CO2 26 01/19/2020 0530   GLUCOSE 152 (H) 01/19/2020 0530   BUN 17 01/19/2020 0530   CREATININE 0.51 (L) 01/19/2020 0530   CALCIUM 7.4 (L) 01/19/2020 0530   PROT 5.9 (L) 01/19/2020 0530   ALBUMIN 1.8 (L) 01/19/2020 0530   AST 17 01/19/2020 0530   ALT  8 01/19/2020 0530   ALKPHOS 52 01/19/2020 0530   BILITOT 0.8 01/19/2020 0530   GFRNONAA >60 01/19/2020 0530   GFRAA >60 11/30/2019 1938   Lipase     Component Value Date/Time   LIPASE 52 (H) 11/30/2019 1938       Studies/Results: DG Abd 1 View  Result Date: 01/19/2020 CLINICAL DATA:  NG placement EXAM: ABDOMEN - 1 VIEW COMPARISON:  CT abdomen pelvis 01/17/2020 FINDINGS: NG tube enters the stomach with the tip in the right upper quadrant likely in the proximal duodenum. Normal bowel gas pattern. Laparotomy staples in the midline. Surgical drain in the right upper quadrant. IMPRESSION: NG tube tip in the region of the duodenum. Electronically Signed   By: Franchot Gallo M.D.   On: 01/19/2020 09:25   ECHOCARDIOGRAM COMPLETE  Result Date: 01/18/2020    ECHOCARDIOGRAM REPORT   Patient Name:   Marcus [name] [facility] Date of Exam: 01/18/2020 Medical Rec #:  [id number]    Height:       70.0 in Accession #:    [id number]   Weight:       166.4 lb Date of Birth:  [birth year]     BSA:          1.931 m Patient Age:    59 years     BP:           94/45 mmHg Patient Gender: M  HR:           58 bpm. Exam Location:  Inpatient Procedure: 2D Echo, Color Doppler, Cardiac Doppler and Intracardiac            Opacification Agent Indications:    Elevated Troponins  History:        Patient has prior history of Echocardiogram examinations, most                 recent 03/15/2019. COPD. Prior performed at [facility].  Sonographer:    Raquel Sarna Senior RDCS Referring Phys: [place]  1. Left ventricular ejection fraction, by estimation, is 55 to 60%. The left ventricle has normal function. The left ventricle has no regional wall motion abnormalities. Left ventricular diastolic parameters are consistent with Grade II diastolic dysfunction (pseudonormalization).  2. Right ventricular systolic function is severely reduced. The right ventricular size is severely enlarged.  3. Right atrial size was mildly  dilated.  4. The mitral valve is normal in structure. Trivial mitral valve regurgitation.  5. The aortic valve is tricuspid. Aortic valve regurgitation is not visualized.  6. The inferior vena cava is normal in size with greater than 50% respiratory variability, suggesting right atrial pressure of 3 mmHg. Comparison(s): Compared to prior echo report in 2020 (unable to view images), the RV appears severely enlarged and hypokinetic. FINDINGS  Left Ventricle: Left ventricular ejection fraction, by estimation, is 55 to 60%. The left ventricle has normal function. The left ventricle has no regional wall motion abnormalities. Definity contrast agent was given IV to delineate the left ventricular  endocardial borders. The left ventricular internal cavity size was normal in size. There is no left ventricular hypertrophy. Abnormal (paradoxical) septal motion, consistent with left bundle branch block. Left ventricular diastolic parameters are consistent with Grade II diastolic dysfunction (pseudonormalization). Right Ventricle: The right ventricular size is severely enlarged. Right vetricular wall thickness was not assessed. Right ventricular systolic function is severely reduced. Left Atrium: Left atrial size was normal in size. Right Atrium: Right atrial size was mildly dilated. Pericardium: There is no evidence of pericardial effusion. Mitral Valve: The mitral valve is normal in structure. Trivial mitral valve regurgitation. Tricuspid Valve: The tricuspid valve is normal in structure. Tricuspid valve regurgitation is trivial. Aortic Valve: The aortic valve is tricuspid. Aortic valve regurgitation is not visualized. Pulmonic Valve: The pulmonic valve was grossly normal. Pulmonic valve regurgitation is trivial. Aorta: The aortic root is normal in size and structure. Venous: The inferior vena cava is normal in size with greater than 50% respiratory variability, suggesting right atrial pressure of 3 mmHg. IAS/Shunts: No atrial  level shunt detected by color flow Doppler.  LEFT VENTRICLE PLAX 2D LVIDd:         4.90 cm  Diastology LVIDs:         3.40 cm  LV e' medial:    6.96 cm/s LV PW:         0.70 cm  LV E/e' medial:  7.1 LV IVS:        0.70 cm  LV e' lateral:   11.10 cm/s LVOT diam:     2.00 cm  LV E/e' lateral: 4.5 LV SV:         66 LV SV Index:   34 LVOT Area:     3.14 cm  RIGHT VENTRICLE RV S prime:     9.79 cm/s TAPSE (M-mode): 2.2 cm LEFT ATRIUM  Index       RIGHT ATRIUM           Index LA diam:        3.30 cm 1.71 cm/m  RA Area:     19.70 cm LA Vol (A2C):   49.5 ml 25.64 ml/m RA Volume:   60.80 ml  31.49 ml/m LA Vol (A4C):   62.7 ml 32.48 ml/m LA Biplane Vol: 57.0 ml 29.52 ml/m  AORTIC VALVE LVOT Vmax:   89.70 cm/s LVOT Vmean:  61.200 cm/s LVOT VTI:    0.210 m  AORTA Ao Root diam: 3.10 cm MITRAL VALVE MV Area (PHT): 1.70 cm    SHUNTS MV Decel Time: 447 msec    Systemic VTI:  0.21 m MV E velocity: 49.40 cm/s  Systemic Diam: 2.00 cm MV A velocity: 44.00 cm/s MV E/A ratio:  1.12 Gwyndolyn Kaufman MD Electronically signed by Gwyndolyn Kaufman MD Signature Date/Time: 01/18/2020/2:46:09 PM    Final     Anti-infectives: Anti-infectives (From admission, onward)   Start     Dose/Rate Route Frequency Ordered Stop   01/18/20 0800  vancomycin (VANCOREADY) IVPB 750 mg/150 mL  Status:  Discontinued        750 mg 150 mL/hr over 60 Minutes Intravenous Every 12 hours 01/17/20 1833 01/18/20 1004   01/18/20 0030  piperacillin-tazobactam (ZOSYN) IVPB 3.375 g        3.375 g 12.5 mL/hr over 240 Minutes Intravenous Every 8 hours 01/17/20 2350     01/17/20 2330  piperacillin-tazobactam (ZOSYN) IVPB 3.375 g  Status:  Discontinued        3.375 g 100 mL/hr over 30 Minutes Intravenous Every 8 hours 01/17/20 2324 01/17/20 2348   01/17/20 1845  vancomycin (VANCOREADY) IVPB 1500 mg/300 mL        1,500 mg 150 mL/hr over 120 Minutes Intravenous  Once 01/17/20 1820 01/18/20 0130   01/17/20 1800  piperacillin-tazobactam (ZOSYN)  IVPB 3.375 g        3.375 g 100 mL/hr over 30 Minutes Intravenous  Once 01/17/20 1800 01/17/20 1858       Assessment/Plan 59 yo male with perforated prepyloric ulcer POD2 s/p ex lap and Graham patch repair. - Remove NG tube. Ok for sips of clear liquids. - Aggressive pulmonary toilet. Duonebs and IS. - Patient needs to mobilize today. PT ordered. - Continue zosyn - VTE: lovenox, SCDs - Dispo: inpatient, transfer to med-surg floor   LOS: 3 days    Michaelle Birks, MD [facility] Surgery General, Hepatobiliary and Pancreatic Surgery 01/20/20 6:52 AM

## 2020-01-20 NOTE — Consult Note (Addendum)
Medical Consultation   Marcus [name]  WUJ:[id number]  DOB: Dec 17, 1960  DOA: 01/17/2020  PCP: Pamella Pert, MD    Requesting physician: Dr. Zenia Resides, General Surgery  Reason for consultation: Medical Comanagement   History of Present Illness: Marcus [name] is an 59 y.o. 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 He also complained of abdominal pain x 2 weeks. In the ED he was hypotensive, febrile, tachycardic and hypoxic and complained of abdominal pain. CT abdomen/pelvis with contrast revealed a perforated viscus with pneumoperitoneum. Due to lack of available ICU beds at [facility], the patient was transferred to [facility]. He was taken to the OR for exploratory laparotomy by Dr. Kieth Brightly on the evening of 10/12 and admitted to the ICU following the surgery. 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 He remained intubated postoperatively while in the ICU. The patient was extubated on 10/13. He also had an echo on 10/13 which showed an EF of 55-60% with severe RVE/RV hypokinesis and RVH with concern for abnormal EKG with RBBB (old) and prolonged QT with anterior precordial T wave inversions. The patient was chest pain free at the time. Cardiology was consulted and it was felt that this picture suggested more chronic cor pulmonale due to lung disease and recommended a bubble study with pulmonary referral at discharge for PFT eval and 6 min walk.  He was also noted to be bradycardic and started on Dopamine which has since been discontinued. PCCM signed off on 10/14. Cardiology signed off on 10/15. TRH was consulted by general surgery on 10/15 to assist  with medical comanagement of the patient's COPD, Felty Syndrome and other medical issues.   Currently, the patient states that he has improved post op abdominal pain and has some constipation. He also has some worsening joint pain in his hands/wrists. He reports that he has been on chronic prednisone for several years and has not seen his rheumatologist since before the [place] pandemic. He has not been receiving steroids here. He denies O2 use at home for his COPD and uses his inhalers/nebulizers daily at home but was unsure of the names [facility] and albuterol on his med list).    Review of Systems:  Review of Systems  Constitutional: Negative for chills and fever.  Respiratory: Positive for cough and sputum production.   Cardiovascular: Negative for chest pain and leg swelling.  Gastrointestinal: Positive for abdominal pain and constipation. Negative for nausea and vomiting.  Musculoskeletal: Positive for joint pain.  All other systems reviewed and are negative.  As per HPI otherwise 10 point review of systems negative.     Past Medical History: Past Medical History:  Diagnosis Date  . COPD (chronic obstructive pulmonary disease) ([place])   . Felty syndrome ([place])   . Hernia, epigastric   . Pancytopenia ([employer] [employer])   . Seropositive rheumatoid arthritis ([place])   . Tobacco use disorder     Past Surgical History: Past Surgical History:  Procedure Laterality Date  . INCISIONAL HERNIA REPAIR  01/17/2020   Procedure: HERNIA REPAIR INCISIONAL AND GRAHAM PATCH;  Surgeon: Kieth Brightly Arta Bruce, MD;  Location: [facility] ORS;  Service: General;;  . LAPAROTOMY N/A 01/17/2020   Procedure: EXPLORATORY LAPAROTOMY;  Surgeon: Kieth Brightly Arta Bruce, MD;  Location: [facility] ORS;  Service: General;  Laterality: N/A;     Allergies:   Allergies  Allergen Reactions  . Levaquin [Levofloxacin In D5w] Other (See Comments)    Chest pain  . Methotrexate Other (See Comments)    Chest pain     Social History:  reports  that he has been smoking cigarettes. He has been smoking about 1.00 pack per day. He has never used smokeless tobacco. He reports current drug use. Drug: Marijuana. He reports that he does not drink alcohol.   Family History: Family History  Problem Relation Age of Onset  . CAD Brother   . COPD Brother     Physical Exam: Vitals:   01/20/20 0819 01/20/20 0838 01/20/20 0900 01/20/20 1000  BP:   (!) 146/70 (!) 157/75  Pulse:   60 (!) 57  Resp:      Temp: 97.8 F (36.6 C)     TempSrc: Oral     SpO2:  94% 94% 93%  Weight:      Height:        Constitutional:  Alert and awake, oriented x3, not in any acute distress. Eyes: PERLA, EOMI, irises appear normal, anicteric sclera,  ENMT: external ears and nose appear normal, normal hearing, missing incisors            Lips appears normal Neck: neck appears normal, no masses, normal ROM, no thyromegaly, no JVD  CVS: S1-S2 clear, no murmur rubs or gallops, no LE edema, normal pedal pulses  Respiratory:  clear to auscultation bilaterally, no wheezing, rales or rhonchi. Does have a cough with difficulty clearing secretions No accessory muscle use.  Abdomen: soft nontender, nondistended, decreased bowel sounds, post op abdomen  Musculoskeletal: : no cyanosis, clubbing or edema noted bilaterally. Nonerythematous joints but his left wrist is swollen. Some ecchymosis Neuro: strength, sensation intact Psych: judgement and insight appear normal, stable mood and affect, mental status Skin: no rashes or lesions or ulcers, no induration or nodules    Data reviewed:  I have personally reviewed following labs and imaging studies Labs:  CBC: Recent Labs  Lab 01/17/20 1454 01/18/20 0029 01/18/20 0300 01/19/20 0530 01/20/20 0537  WBC 9.4  --  18.1* 22.4* 12.4*  NEUTROABS 8.2*  --   --  20.6*  --   HGB 19.1* 17.3* 16.5 14.5 16.5  HCT 60.0* 51.0 53.0* 46.4 52.8*  MCV 88.9  --  90.8 91.5 90.3  PLT 346  --  334 339 415*    Basic Metabolic  Panel: Recent Labs  Lab 01/17/20 1454 01/17/20 1454 01/18/20 0029 01/18/20 0029 01/18/20 0300 01/18/20 0300 01/19/20 0530 01/20/20 0537  NA 134*  --  141  --  134*  --  134* 141  K 4.3   < > 4.2   < > 4.3   < > 3.7 3.2*  CL 93*  --   --   --  102  --  101 106  CO2 30  --   --   --  26  --  26 27  GLUCOSE 120*  --   --   --  164*  --  152* 71  BUN 20  --   --   --  22*  --  17 10  CREATININE 0.78  --   --   --  0.80  --  0.51* 0.46*  CALCIUM 8.1*  --   --   --  7.1*  --  7.4* 7.6*  MG  --   --   --   --  1.9  --  2.3  --   PHOS  --   --   --   --  5.2*  --   --   --    < > = values in this interval not displayed.   GFR Estimated Creatinine Clearance: 101.9 mL/min (A) (by C-G formula based on SCr of 0.46 mg/dL (L)). Liver Function Tests: Recent Labs  Lab 01/17/20 1454 01/19/20 0530  AST 22 17  ALT 9 8  ALKPHOS 94 52  BILITOT 1.6* 0.8  PROT 8.1 5.9*  ALBUMIN 2.5* 1.8*   No results for input(s): LIPASE, AMYLASE in the last 168 hours. No results for input(s): AMMONIA in the last 168 hours. Coagulation profile No results for input(s): INR, PROTIME in the last 168 hours.  Cardiac Enzymes: No results for input(s): CKTOTAL, CKMB, CKMBINDEX, TROPONINI in the last 168 hours. BNP: Invalid input(s): POCBNP CBG: Recent Labs  Lab 01/18/20 0154  GLUCAP 121*   D-Dimer Recent Labs    01/17/20 1454  DDIMER 3.69*   Hgb A1c No results for input(s): HGBA1C in the last 72 hours. Lipid Profile Recent Labs    01/17/20 1454  TRIG 142   Thyroid function studies No results for input(s): TSH, T4TOTAL, T3FREE, THYROIDAB in the last 72 hours.  Invalid input(s): FREET3 Anemia work up Recent Labs    01/17/20 1454  FERRITIN 376*   Urinalysis    Component Value Date/Time   COLORURINE AMBER (A) 03/03/2017 1707   APPEARANCEUR CLOUDY (A) 03/03/2017 1707   LABSPEC 1.027 03/03/2017 1707   PHURINE 5.0 03/03/2017 1707   GLUCOSEU NEGATIVE 03/03/2017 1707   HGBUR NEGATIVE  03/03/2017 1707   BILIRUBINUR NEGATIVE 03/03/2017 1707   KETONESUR NEGATIVE 03/03/2017 1707   PROTEINUR 30 (A) 03/03/2017 1707   UROBILINOGEN 0.2 10/21/2012 1737   NITRITE NEGATIVE 03/03/2017 1707   LEUKOCYTESUR NEGATIVE 03/03/2017 1707     Microbiology Recent Results (from the past 240 hour(s))  Respiratory Panel by RT PCR (Flu A&B, Covid) - Nasopharyngeal Swab     Status: None   Collection Time: 01/17/20  2:54 PM   Specimen: Nasopharyngeal Swab  Result Value Ref Range Status   SARS Coronavirus 2 by RT PCR NEGATIVE NEGATIVE Final    Comment: (NOTE) SARS-CoV-2 target nucleic acids are NOT DETECTED.  The SARS-CoV-2 RNA is generally detectable in upper respiratoy specimens during the acute phase of infection. The lowest concentration of SARS-CoV-2 viral copies this assay can detect is 131 copies/mL. A negative result does not preclude SARS-Cov-2 infection and should not be used as the sole basis for treatment or other patient management decisions. A negative result may occur with  improper specimen collection/handling, submission of specimen other than nasopharyngeal swab, presence of viral mutation(s) within the areas targeted by this assay, and inadequate number of viral copies (<131 copies/mL). A negative result must be combined with clinical observations, patient history, and epidemiological information. The expected result is Negative.  Fact Sheet for Patients:  PinkCheek.be  Fact Sheet for Healthcare Providers:  GravelBags.it  This test is no t yet approved or cleared by the [place] FDA and  has been authorized for detection and/or diagnosis of SARS-CoV-2 by FDA under an Emergency Use Authorization (EUA). This EUA will remain  in effect (meaning this  test can be used) for the duration of the COVID-19 declaration under Section 564(b)(1) of the Act, 21 U.S.C. section 360bbb-3(b)(1), unless the authorization  is terminated or revoked sooner.     Influenza A by PCR NEGATIVE NEGATIVE Final   Influenza B by PCR NEGATIVE NEGATIVE Final    Comment: (NOTE) The Xpert Xpress SARS-CoV-2/FLU/RSV assay is intended as an aid in  the diagnosis of influenza from Nasopharyngeal swab specimens and  should not be used as a sole basis for treatment. Nasal washings and  aspirates are unacceptable for Xpert Xpress SARS-CoV-2/FLU/RSV  testing.  Fact Sheet for Patients: PinkCheek.be  Fact Sheet for Healthcare Providers: GravelBags.it  This test is not yet approved or cleared by the [place] FDA and  has been authorized for detection and/or diagnosis of SARS-CoV-2 by  FDA under an Emergency Use Authorization (EUA). This EUA will remain  in effect (meaning this test can be used) for the duration of the  Covid-19 declaration under Section 564(b)(1) of the Act, 21  U.S.C. section 360bbb-3(b)(1), unless the authorization is  terminated or revoked. Performed at [facility], [address], [place], [place] [address]   Blood Culture (routine x 2)     Status: None (Preliminary result)   Collection Time: 01/17/20  2:54 PM   Specimen: Left Antecubital; Blood  Result Value Ref Range Status   Specimen Description LEFT ANTECUBITAL  Final   Special Requests   Final    BOTTLES DRAWN AEROBIC AND ANAEROBIC Blood Culture adequate volume   Culture   Final    NO GROWTH 3 DAYS Performed at [facility], [address], [place], [place] [address]    Report Status PENDING  Incomplete  Blood Culture (routine x 2)     Status: None (Preliminary result)   Collection Time: 01/17/20  2:59 PM   Specimen: BLOOD LEFT WRIST  Result Value Ref Range Status   Specimen Description BLOOD LEFT WRIST  Final   Special Requests   Final    BOTTLES DRAWN AEROBIC AND ANAEROBIC Blood Culture adequate volume   Culture   Final    NO GROWTH 3 DAYS Performed at [facility],  [address] [place], [place] [address]    Report Status PENDING  Incomplete  MRSA PCR Screening     Status: None   Collection Time: 01/17/20  6:35 PM   Specimen: Nasal Mucosa; Nasopharyngeal  Result Value Ref Range Status   MRSA by PCR NEGATIVE NEGATIVE Final    Comment:        The GeneXpert MRSA Assay (FDA approved for NASAL specimens only), is one component of a comprehensive MRSA colonization surveillance program. It is not intended to diagnose MRSA infection nor to guide or monitor treatment for MRSA infections. Performed at [facility], [address], [place], [place] [address]        Inpatient Medications:   Scheduled Meds: . Chlorhexidine Gluconate Cloth  6 each Topical Daily  . enoxaparin (LOVENOX) injection  40 mg Subcutaneous Q24H  . ipratropium-albuterol  3 mL Nebulization Q6H  . lidocaine  1 patch Transdermal Q24H  . mouth rinse  15 mL Mouth Rinse BID  . mometasone-formoterol  1 puff Inhalation BID  . nicotine  21 mg Transdermal Daily  . nystatin  5 mL Oral QID  . pantoprazole (PROTONIX) IV  40 mg Intravenous Q12H   Continuous Infusions: . methocarbamol (ROBAXIN) IV Stopped (01/20/20 1062)  . piperacillin-tazobactam (ZOSYN)  IV 12.5 mL/hr at 01/20/20 0838  .  potassium chloride       Radiological Exams on Admission: DG Abd 1 View  Result Date: 01/19/2020 CLINICAL DATA:  NG placement EXAM: ABDOMEN - 1 VIEW COMPARISON:  CT abdomen pelvis 01/17/2020 FINDINGS: NG tube enters the stomach with the tip in the right upper quadrant likely in the proximal duodenum. Normal bowel gas pattern. Laparotomy staples in the midline. Surgical drain in the right upper quadrant. IMPRESSION: NG tube tip in the region of the duodenum. Electronically Signed   By: Franchot Gallo M.D.   On: 01/19/2020 09:25   ECHOCARDIOGRAM COMPLETE  Result Date: 01/18/2020    ECHOCARDIOGRAM REPORT   Patient Name:   Marcus [name] [facility] Date of Exam: 01/18/2020 Medical Rec #:  [id number]    Height:        70.0 in Accession #:    [id number]   Weight:       166.4 lb Date of Birth:  November 20, 1960     BSA:          1.931 m Patient Age:    32 years     BP:           94/45 mmHg Patient Gender: M            HR:           58 bpm. Exam Location:  Inpatient Procedure: 2D Echo, Color Doppler, Cardiac Doppler and Intracardiac            Opacification Agent Indications:    Elevated Troponins  History:        Patient has prior history of Echocardiogram examinations, most                 recent 03/15/2019. COPD. Prior performed at [facility].  Sonographer:    Raquel Sarna Senior RDCS Referring Phys: [place]  1. Left ventricular ejection fraction, by estimation, is 55 to 60%. The left ventricle has normal function. The left ventricle has no regional wall motion abnormalities. Left ventricular diastolic parameters are consistent with Grade II diastolic dysfunction (pseudonormalization).  2. Right ventricular systolic function is severely reduced. The right ventricular size is severely enlarged.  3. Right atrial size was mildly dilated.  4. The mitral valve is normal in structure. Trivial mitral valve regurgitation.  5. The aortic valve is tricuspid. Aortic valve regurgitation is not visualized.  6. The inferior vena cava is normal in size with greater than 50% respiratory variability, suggesting right atrial pressure of 3 mmHg. Comparison(s): Compared to prior echo report in 2020 (unable to view images), the RV appears severely enlarged and hypokinetic. FINDINGS  Left Ventricle: Left ventricular ejection fraction, by estimation, is 55 to 60%. The left ventricle has normal function. The left ventricle has no regional wall motion abnormalities. Definity contrast agent was given IV to delineate the left ventricular  endocardial borders. The left ventricular internal cavity size was normal in size. There is no left ventricular hypertrophy. Abnormal (paradoxical) septal motion, consistent with left bundle branch block. Left  ventricular diastolic parameters are consistent with Grade II diastolic dysfunction (pseudonormalization). Right Ventricle: The right ventricular size is severely enlarged. Right vetricular wall thickness was not assessed. Right ventricular systolic function is severely reduced. Left Atrium: Left atrial size was normal in size. Right Atrium: Right atrial size was mildly dilated. Pericardium: There is no evidence of pericardial effusion. Mitral Valve: The mitral valve is normal in structure. Trivial mitral valve regurgitation. Tricuspid Valve: The tricuspid valve is normal in structure. Tricuspid valve  regurgitation is trivial. Aortic Valve: The aortic valve is tricuspid. Aortic valve regurgitation is not visualized. Pulmonic Valve: The pulmonic valve was grossly normal. Pulmonic valve regurgitation is trivial. Aorta: The aortic root is normal in size and structure. Venous: The inferior vena cava is normal in size with greater than 50% respiratory variability, suggesting right atrial pressure of 3 mmHg. IAS/Shunts: No atrial level shunt detected by color flow Doppler.  LEFT VENTRICLE PLAX 2D LVIDd:         4.90 cm  Diastology LVIDs:         3.40 cm  LV e' medial:    6.96 cm/s LV PW:         0.70 cm  LV E/e' medial:  7.1 LV IVS:        0.70 cm  LV e' lateral:   11.10 cm/s LVOT diam:     2.00 cm  LV E/e' lateral: 4.5 LV SV:         66 LV SV Index:   34 LVOT Area:     3.14 cm  RIGHT VENTRICLE RV S prime:     9.79 cm/s TAPSE (M-mode): 2.2 cm LEFT ATRIUM             Index       RIGHT ATRIUM           Index LA diam:        3.30 cm 1.71 cm/m  RA Area:     19.70 cm LA Vol (A2C):   49.5 ml 25.64 ml/m RA Volume:   60.80 ml  31.49 ml/m LA Vol (A4C):   62.7 ml 32.48 ml/m LA Biplane Vol: 57.0 ml 29.52 ml/m  AORTIC VALVE LVOT Vmax:   89.70 cm/s LVOT Vmean:  61.200 cm/s LVOT VTI:    0.210 m  AORTA Ao Root diam: 3.10 cm MITRAL VALVE MV Area (PHT): 1.70 cm    SHUNTS MV Decel Time: 447 msec    Systemic VTI:  0.21 m MV E  velocity: 49.40 cm/s  Systemic Diam: 2.00 cm MV A velocity: 44.00 cm/s MV E/A ratio:  1.12 Gwyndolyn Kaufman MD Electronically signed by Gwyndolyn Kaufman MD Signature Date/Time: 01/18/2020/2:46:09 PM    Final     Impression/Recommendations Principal Problem:   Perforated prepyloric ulcer 01/18/2020 Active Problems:   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HCC)   Chronic obstructive pulmonary disease (HCC)   S/P splenectomy   COPD with acute exacerbation (HCC)   Pneumoperitoneum   Severe sepsis with septic shock (HCC)   Acute hypoxemic respiratory failure (HCC)   Hypokalemia   1. Sepsis with shock secondary to Perforated Prepyloric ulcer with pneumoperitoneum 1. Sepsis on admission criteria: Elevated lactic acid, leukocytosis, tachycardia and bradycardia, and fever. Also with Hypotension and hypoxia. Cortisol was not checked. Sepsis and shock has resolved.  2. Blood cultures NGTD 3. Briefly required Dopamine, currently off pressors and has an A-Line 4. Getting IV Tylenol 5. Day 3/TBD antibiotics, currently on Zosyn 6. WBC 22->12, baseline is about 1-2 given his history of Felty Syndrome s/p splenectomy 7. Continue antibiotics for now  2. Acute Hypoxic Respiratory Failure secondary to above and concern for acute atypical pneumonia in the setting of COPD, Likely with a component of atelectasis as well 1. D Dimer elevated on admission -> CTA chest negative for PE but showed findings consistent with atypical pneumonia 2. Had hypoxia (SpO2 in 80s on admission) requiring continued post op intubation (10/12->10/13), currently on 2 LPM 3. Received several days of IV fluids 4.  Continue to wean O2 5. PT Eval 6. OOB as tolerated  7. Incentive spirometer 8. Zosyn 9. CXR  3. Perforated Prepyloric ulcer with pneumoperitoneum s/p ex lap and incisional hernia repair 01/17/20 with Dr. Kieth Brightly, POD 3 1. Management per primary team   4. COPD exacerbation 1. Currently with cough and sputum production, increased  from baseline 2. Currently on scheduled DuoNeb  3. Restart home Dulera  5. Felty Syndrome s/p splenectomy in 2017 1. Felty Syndrome: triad of RA, splenomegaly and neutropenia (baseline 1-2, currently 12)  2. Was on chronic prednisone which was apparently discontinued in May then restarted on a steroid Dosepak which he completed on 12/16/19 and therefore did not receive stress dose steroids on admission   3. Currently with some joint discomfort 4. Given that he has a suspected underlying infection and illness I would probably hold his steroids for now however I am awaiting a call back from the patient's rheumatologist to discuss  5. Needs to follow up with rheumatology at discharge  6. Chronic Cor Pulmonale 1. Cardiology consulted: will need outpatient pulmonary follow up and consider home O2, PFTs   7. Hypokalemia 1. Replete IV      Thank you for this consultation.  Our [facility] hospitalist team will follow the patient with you.   Time Spent: 80 minutes  Harold Hedge M.D. [facility] 01/20/2020, 11:32 AM

## 2020-01-20 NOTE — Progress Notes (Signed)
Patient received to 1436 from 3rd floor, VS obtained, telemetry monitor applied, oriented to unit and call light placed in reach

## 2020-01-21 DIAGNOSIS — J449 Chronic obstructive pulmonary disease, unspecified: Secondary | ICD-10-CM

## 2020-01-21 DIAGNOSIS — J9601 Acute respiratory failure with hypoxia: Secondary | ICD-10-CM | POA: Diagnosis not present

## 2020-01-21 DIAGNOSIS — E876 Hypokalemia: Secondary | ICD-10-CM | POA: Diagnosis not present

## 2020-01-21 DIAGNOSIS — M0500 Felty's syndrome, unspecified site: Secondary | ICD-10-CM | POA: Diagnosis not present

## 2020-01-21 MED ORDER — FUROSEMIDE 10 MG/ML IJ SOLN
40.0000 mg | Freq: Once | INTRAMUSCULAR | Status: AC
  Administered 2020-01-21: 40 mg via INTRAVENOUS
  Filled 2020-01-21: qty 4

## 2020-01-21 NOTE — Progress Notes (Signed)
4 Days Post-Op   Subjective/Chief Complaint: NONE Pt ok this am slightly SOB on O2 but no N/V    Objective: Vital signs in last 24 hours: Temp:  [97.5 F (36.4 C)-98.3 F (36.8 C)] 97.7 F (36.5 C) (10/16 0622) Pulse Rate:  [57-94] 83 (10/16 0622) Resp:  [18-24] 20 (10/16 0622) BP: (126-171)/(70-96) 126/80 (10/16 0622) SpO2:  [90 %-95 %] 91 % (10/16 0622) Arterial Line BP: (132-149)/(59-73) 149/73 (10/15 1400) Weight:  [69 kg] 69 kg (10/16 0500) Last BM Date:  (PTA )  Intake/Output from previous day: 10/15 0701 - 10/16 0700 In: 334.1 [IV Piggyback:334.1] Out: 1200 [Urine:1100; Drains:100] Intake/Output this shift: No intake/output data recorded.   General: resting comfortably, NAD Neuro: alert and oriented, no focal deficits HEENT: NG in place with minimal gastric contents Resp: normal work of breathing on nasal cannula CV: RRR Abdomen: soft, nondistended, nontender to palpation. Midline incision clean and dry with honeycomb dressing in place. JP with serosanguinous drainage. Extremities: warm and well-perfused Lab Results:  Recent Labs    01/19/20 0530 01/20/20 0537  WBC 22.4* 12.4*  HGB 14.5 16.5  HCT 46.4 52.8*  PLT 339 415*   BMET Recent Labs    01/19/20 0530 01/20/20 0537  NA 134* 141  K 3.7 3.2*  CL 101 106  CO2 26 27  GLUCOSE 152* 71  BUN 17 10  CREATININE 0.51* 0.46*  CALCIUM 7.4* 7.6*   PT/INR No results for input(s): LABPROT, INR in the last 72 hours. ABG No results for input(s): PHART, HCO3 in the last 72 hours.  Invalid input(s): PCO2, PO2  Studies/Results: DG Abd 1 View  Result Date: 01/19/2020 CLINICAL DATA:  NG placement EXAM: ABDOMEN - 1 VIEW COMPARISON:  CT abdomen pelvis 01/17/2020 FINDINGS: NG tube enters the stomach with the tip in the right upper quadrant likely in the proximal duodenum. Normal bowel gas pattern. Laparotomy staples in the midline. Surgical drain in the right upper quadrant. IMPRESSION: NG tube tip in the  region of the duodenum. Electronically Signed   By: Franchot Gallo M.D.   On: 01/19/2020 09:25   DG CHEST PORT 1 VIEW  Result Date: 01/20/2020 CLINICAL DATA:  Hypoxia. EXAM: PORTABLE CHEST 1 VIEW COMPARISON:  Radiographs 01/17/2020 and 11/30/2019.  CT 01/17/2020. FINDINGS: 1126 hours. The heart size and mediastinal contours are stable. Diffuse interstitial prominence seen on the most recent studies has improved, associated with central airway thickening and tree-in-bud nodularity on CT. There is no confluent airspace opacity, pleural effusion or pneumothorax. Old rib fracture noted on the right. Telemetry leads overlie the chest. IMPRESSION: Interval improvement in diffuse interstitial prominence suggesting improving atypical infection or inflammation. No focal airspace disease. Electronically Signed   By: Richardean Sale M.D.   On: 01/20/2020 11:56    Anti-infectives: Anti-infectives (From admission, onward)   Start     Dose/Rate Route Frequency Ordered Stop   01/18/20 0800  vancomycin (VANCOREADY) IVPB 750 mg/150 mL  Status:  Discontinued        750 mg 150 mL/hr over 60 Minutes Intravenous Every 12 hours 01/17/20 1833 01/18/20 1004   01/18/20 0030  piperacillin-tazobactam (ZOSYN) IVPB 3.375 g        3.375 g 12.5 mL/hr over 240 Minutes Intravenous Every 8 hours 01/17/20 2350     01/17/20 2330  piperacillin-tazobactam (ZOSYN) IVPB 3.375 g  Status:  Discontinued        3.375 g 100 mL/hr over 30 Minutes Intravenous Every 8 hours 01/17/20 2324 01/17/20 2348  01/17/20 1845  vancomycin (VANCOREADY) IVPB 1500 mg/300 mL        1,500 mg 150 mL/hr over 120 Minutes Intravenous  Once 01/17/20 1820 01/18/20 0130   01/17/20 1800  piperacillin-tazobactam (ZOSYN) IVPB 3.375 g        3.375 g 100 mL/hr over 30 Minutes Intravenous  Once 01/17/20 1800 01/17/20 1858      Assessment/Plan: s/p Procedure(s): EXPLORATORY LAPAROTOMY (N/A) HERNIA REPAIR INCISIONAL AND GRAHAM PATCH 59 yo male with perforated  prepyloric ulcer POD2 s/p ex lap and Engineer, structural. - clear liquids. - Aggressive pulmonary toilet. Duonebs and IS. - Patient needs to mobilize today. PT ordered. - Continue zosyn - VTE: lovenox, SCDs - Dispo: inpatient LOS: 4 days    Turner Daniels MD  01/21/2020

## 2020-01-21 NOTE — Progress Notes (Signed)
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  He also complained of abdominal pain for 2 weeks.  Patient was hypotensive and tachycardic hypoxic and complained of abdominal pain.  CT abdomen/pelvis with contrast showed perforated viscus and pneumoperitoneum.  Patient was transferred from [place] [facility] to [facility] long.  He was taken to the OR for exploratory laparotomy by Dr. Kieth Brightly on 01/17/2020.  He was found to have perforated pyloric channel ulcer and incisional hernia which was repaired as well.  He was intubated in the ICU and extubated on 01/18/2020.  He had an echo on 01/18/2020 which showed EF 55 to 60% with severe right ventricular hypokinesis.  Cardiology was consulted and it was felt picture was more likely chronic cor pulmonale due to lung disease and recommended bubble study with pulmonary referral at discharge for PFT eval and 6-minute test.  Cardiology signed off on 01/20/2020.  PCCM signed off on 01/19/2020.  [facility] was consulted by general surgery on 01/20/2020 for medical comanagement.    Subjective   Patient seen and examined, denies any pain at this time.  Denies shortness of breath.  Patient states that he has been on chronic prednisone for several years and has not seen a rheumatologist since before the Covid pandemic.  He is not on home oxygen for COPD   Assessment/Plan:     1. Acute hypoxemic respiratory failure-in setting of COPD with atypical pneumonia.  CTA negative for pulmonary embolism.  Continue pulmonary toilet, incentive spirometry, IV Zosyn, continue to wean off oxygen as tolerated.  Patient is currently requiring 2 L/min of oxygen via nasal cannula.  He is +3 L fluid overload.  Will give 1  dose of Lasix 40 mg IV and assess response. 2. Hypokalemia-potassium was 3.2 on 01/20/2020, it was replaced with IV KCl.  Follow BMP in am. 3. COPD exacerbation-continue scheduled duo nebs, Dulera. 4. 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  He was on chronic prednisone which was discontinued in May and then restarted on steroid Dosepak which he completed on 12/16/2019.  Steroids are currently on hold.  He is to follow-up with rheumatology at discharge. 5. Perforated prepyloric ulcer with pneumoperitoneum-General surgery following.     COVID-19 Labs  No results for input(s): DDIMER, FERRITIN, LDH, CRP in the last 72 hours.  Lab Results  Component Value Date   SARSCOV2NAA NEGATIVE 01/17/2020   [place] NEGATIVE 11/30/2019   [place] NEGATIVE 08/18/2019     Scheduled medications:   . enoxaparin (LOVENOX) injection  40 mg Subcutaneous Q24H  . ipratropium-albuterol  3 mL Nebulization Q6H  . lidocaine  1 patch Transdermal Q24H  . mouth rinse  15 mL Mouth Rinse BID  . mometasone-formoterol  1 puff Inhalation BID  . nicotine  21 mg Transdermal Daily  . nystatin  5 mL Oral QID  . pantoprazole (PROTONIX) IV  40 mg Intravenous Q12H         CBG: Recent Labs  Lab 01/18/20 0154  GLUCAP 121*    SpO2: 93 % O2 Flow Rate (L/min): 2 L/min FiO2 (%): 40 %    CBC: Recent Labs  Lab 01/17/20 1454 01/18/20 0029 01/18/20 0300 01/19/20 0530 01/20/20 0537  WBC 9.4  --  18.1* 22.4* 12.4*  NEUTROABS 8.2*  --   --  20.6*  --   HGB 19.1* 17.3* 16.5 14.5 16.5  HCT 60.0* 51.0 53.0* 46.4 52.8*  MCV 88.9  --  90.8 91.5 90.3  PLT 346  --  334 339 415*    Basic Metabolic Panel: Recent Labs  Lab 01/17/20 1454 01/18/20 0029 01/18/20 0300 01/19/20 0530 01/20/20 0537  NA 134* 141 134* 134* 141  K 4.3 4.2 4.3 3.7 3.2*  CL 93*  --  102 101 106  CO2 30  --  26 26 27   GLUCOSE 120*  --  164* 152* 71   BUN 20  --  22* 17 10  CREATININE 0.78  --  0.80 0.51* 0.46*  CALCIUM 8.1*  --  7.1* 7.4* 7.6*  MG  --   --  1.9 2.3 2.0  PHOS  --   --  5.2*  --   --      Liver Function Tests: Recent Labs  Lab 01/17/20 1454 01/19/20 0530  AST 22 17  ALT 9 8  ALKPHOS 94 52  BILITOT 1.6* 0.8  PROT 8.1 5.9*  ALBUMIN 2.5* 1.8*     Antibiotics: Anti-infectives (From admission, onward)   Start     Dose/Rate Route Frequency Ordered Stop   01/18/20 0800  vancomycin (VANCOREADY) IVPB 750 mg/150 mL  Status:  Discontinued        750 mg 150 mL/hr over 60 Minutes Intravenous Every 12 hours 01/17/20 1833 01/18/20 1004   01/18/20 0030  piperacillin-tazobactam (ZOSYN) IVPB 3.375 g        3.375 g 12.5 mL/hr over 240 Minutes Intravenous Every 8 hours 01/17/20 2350     01/17/20 2330  piperacillin-tazobactam (ZOSYN) IVPB 3.375 g  Status:  Discontinued        3.375 g 100 mL/hr over 30 Minutes Intravenous Every 8 hours 01/17/20 2324 01/17/20 2348   01/17/20 1845  vancomycin (VANCOREADY) IVPB 1500 mg/300 mL        1,500 mg 150 mL/hr over 120 Minutes Intravenous  Once 01/17/20 1820 01/18/20 0130   01/17/20 1800  piperacillin-tazobactam (ZOSYN) IVPB 3.375 g        3.375 g 100 mL/hr over 30 Minutes Intravenous  Once 01/17/20 1800 01/17/20 1858       DVT prophylaxis: Lovenox      Objective   Vitals:   01/21/20 0622 01/21/20 0828 01/21/20 0831 01/21/20 1309  BP: 126/80   133/89  Pulse: 83   76  Resp: 20   19  Temp: 97.7 F (36.5 C)   97.9 F (36.6 C)  TempSrc: Oral   Oral  SpO2: 91% 90% 90% 93%  Weight:      Height:        Intake/Output Summary (Last 24 hours) at 01/21/2020 1745 Last data filed at 01/21/2020 1655 Gross per 24 hour  Intake 930 ml  Output 1945 ml  Net -1015 ml    10/14 1901 - 10/16 0700 In: 986.6 [I.V.:358.8] Out: 2020 [Urine:1800; Drains:150]  Filed Weights   01/19/20 0500 01/20/20 0500 01/21/20 0500  Weight: 74.1 kg 71.6 kg 69 kg    Physical  Examination:    General: Appears in no acute distress  Cardiovascular: S1-S2, regular, no murmur auscultated  Respiratory: Clear to auscultation bilaterally  Abdomen: Abdomen is soft, nontender, no organomegaly  Extremities: No edema in the lower extremities  Neurologic: Cranial nerves II through XII grossly intact, no focal  deficit noted    Data Reviewed:   Recent Results (from the past 240 hour(s))  Respiratory Panel by RT PCR (Flu A&B, Covid) - Nasopharyngeal Swab     Status: None   Collection Time: 01/17/20  2:54 PM   Specimen: Nasopharyngeal Swab  Result Value Ref Range Status   SARS Coronavirus 2 by RT PCR NEGATIVE NEGATIVE Final    Comment: (NOTE) SARS-CoV-2 target nucleic acids are NOT DETECTED.  The SARS-CoV-2 RNA is generally detectable in upper respiratoy specimens during the acute phase of infection. The lowest concentration of SARS-CoV-2 viral copies this assay can detect is 131 copies/mL. A negative result does not preclude SARS-Cov-2 infection and should not be used as the sole basis for treatment or other patient management decisions. A negative result may occur with  improper specimen collection/handling, submission of specimen other than nasopharyngeal swab, presence of viral mutation(s) within the areas targeted by this assay, and inadequate number of viral copies (<131 copies/mL). A negative result must be combined with clinical observations, patient history, and epidemiological information. The expected result is Negative.  Fact Sheet for Patients:  PinkCheek.be  Fact Sheet for Healthcare Providers:  GravelBags.it  This test is no t yet approved or cleared by the [place] FDA and  has been authorized for detection and/or diagnosis of SARS-CoV-2 by FDA under an Emergency Use Authorization (EUA). This EUA will remain  in effect (meaning this test can be used) for the duration of  the COVID-19 declaration under Section 564(b)(1) of the Act, 21 U.S.C. section 360bbb-3(b)(1), unless the authorization is terminated or revoked sooner.     Influenza A by PCR NEGATIVE NEGATIVE Final   Influenza B by PCR NEGATIVE NEGATIVE Final    Comment: (NOTE) The Xpert Xpress SARS-CoV-2/FLU/RSV assay is intended as an aid in  the diagnosis of influenza from Nasopharyngeal swab specimens and  should not be used as a sole basis for treatment. Nasal washings and  aspirates are unacceptable for Xpert Xpress SARS-CoV-2/FLU/RSV  testing.  Fact Sheet for Patients: PinkCheek.be  Fact Sheet for Healthcare Providers: GravelBags.it  This test is not yet approved or cleared by the [place] FDA and  has been authorized for detection and/or diagnosis of SARS-CoV-2 by  FDA under an Emergency Use Authorization (EUA). This EUA will remain  in effect (meaning this test can be used) for the duration of the  Covid-19 declaration under Section 564(b)(1) of the Act, 21  U.S.C. section 360bbb-3(b)(1), unless the authorization is  terminated or revoked. Performed at [facility], [address] [place], [place] [address]   Blood Culture (routine x 2)     Status: None (Preliminary result)   Collection Time: 01/17/20  2:54 PM   Specimen: Left Antecubital; Blood  Result Value Ref Range Status   Specimen Description LEFT ANTECUBITAL  Final   Special Requests   Final    BOTTLES DRAWN AEROBIC AND ANAEROBIC Blood Culture adequate volume   Culture   Final    NO GROWTH 4 DAYS Performed at [facility], [address], [place],  [address]    Report Status PENDING  Incomplete  Blood Culture (routine x 2)     Status: None (Preliminary result)   Collection Time: 01/17/20  2:59 PM   Specimen: BLOOD LEFT WRIST  Result Value Ref Range Status   Specimen Description BLOOD LEFT WRIST  Final   Special Requests   Final    BOTTLES DRAWN  AEROBIC AND ANAEROBIC Blood Culture adequate volume  Culture   Final    NO GROWTH 4 DAYS Performed at [facility], [address] [place], [place] [address]    Report Status PENDING  Incomplete  MRSA PCR Screening     Status: None   Collection Time: 01/17/20  6:35 PM   Specimen: Nasal Mucosa; Nasopharyngeal  Result Value Ref Range Status   MRSA by PCR NEGATIVE NEGATIVE Final    Comment:        The GeneXpert MRSA Assay (FDA approved for NASAL specimens only), is one component of a comprehensive MRSA colonization surveillance program. It is not intended to diagnose MRSA infection nor to guide or monitor treatment for MRSA infections. Performed at [facility], [address], [place], [place] [address]     No results for input(s): LIPASE, AMYLASE in the last 168 hours. No results for input(s): AMMONIA in the last 168 hours.  Cardiac Enzymes: No results for input(s): CKTOTAL, CKMB, CKMBINDEX, TROPONINI in the last 168 hours. BNP (last 3 results) No results for input(s): BNP in the last 8760 hours.  ProBNP (last 3 results) No results for input(s): PROBNP in the last 8760 hours.  Studies:  DG CHEST PORT 1 VIEW  Result Date: 01/20/2020 CLINICAL DATA:  Hypoxia. EXAM: PORTABLE CHEST 1 VIEW COMPARISON:  Radiographs 01/17/2020 and 11/30/2019.  CT 01/17/2020. FINDINGS: 1126 hours. The heart size and mediastinal contours are stable. Diffuse interstitial prominence seen on the most recent studies has improved, associated with central airway thickening and tree-in-bud nodularity on CT. There is no confluent airspace opacity, pleural effusion or pneumothorax. Old rib fracture noted on the right. Telemetry leads overlie the chest. IMPRESSION: Interval improvement in diffuse interstitial prominence suggesting improving atypical infection or inflammation. No focal airspace disease. Electronically Signed   By: Richardean Sale M.D.   On: 01/20/2020 11:56       [facility] If 7PM-7AM, please contact night-coverage at [email or web address], Office  [phone]   01/21/2020, 5:45 PM  LOS: 4 days

## 2020-01-22 ENCOUNTER — Inpatient Hospital Stay (HOSPITAL_COMMUNITY): Payer: Medicaid Other

## 2020-01-22 DIAGNOSIS — J9601 Acute respiratory failure with hypoxia: Secondary | ICD-10-CM | POA: Diagnosis not present

## 2020-01-22 LAB — COMPREHENSIVE METABOLIC PANEL
ALT: 10 U/L (ref 0–44)
AST: 17 U/L (ref 15–41)
Albumin: 2.1 g/dL — ABNORMAL LOW (ref 3.5–5.0)
Alkaline Phosphatase: 79 U/L (ref 38–126)
Anion gap: 9 (ref 5–15)
BUN: 10 mg/dL (ref 6–20)
CO2: 26 mmol/L (ref 22–32)
Calcium: 8 mg/dL — ABNORMAL LOW (ref 8.9–10.3)
Chloride: 97 mmol/L — ABNORMAL LOW (ref 98–111)
Creatinine, Ser: 0.6 mg/dL — ABNORMAL LOW (ref 0.61–1.24)
GFR, Estimated: >60 mL/min (ref >60)
Glucose, Bld: 130 mg/dL — ABNORMAL HIGH (ref 70–99)
Potassium: 3.9 mmol/L (ref 3.5–5.1)
Sodium: 132 mmol/L — ABNORMAL LOW (ref 135–145)
Total Bilirubin: 1.1 mg/dL (ref 0.3–1.2)
Total Protein: 6.9 g/dL (ref 6.5–8.1)

## 2020-01-22 LAB — CBC
HCT: 50.6 % (ref 39.0–52.0)
Hemoglobin: 16.6 g/dL (ref 13.0–17.0)
MCH: 28.5 pg (ref 26.0–34.0)
MCHC: 32.8 g/dL (ref 30.0–36.0)
MCV: 86.9 fL (ref 80.0–100.0)
Platelets: 335 10*3/uL (ref 150–400)
RBC: 5.82 MIL/uL — ABNORMAL HIGH (ref 4.22–5.81)
RDW: 16.7 % — ABNORMAL HIGH (ref 11.5–15.5)
WBC: 6.7 10*3/uL (ref 4.0–10.5)
nRBC: 0 % (ref 0.0–0.2)

## 2020-01-22 IMAGING — DX DG CHEST 1V PORT SAME DAY
1 series · 1 of 1 positions shown · non-contrast
Comparison: [DATE]

CLINICAL DATA: COPD.  I LD.

EXAM:
PORTABLE CHEST 1 VIEW

[chest ap]
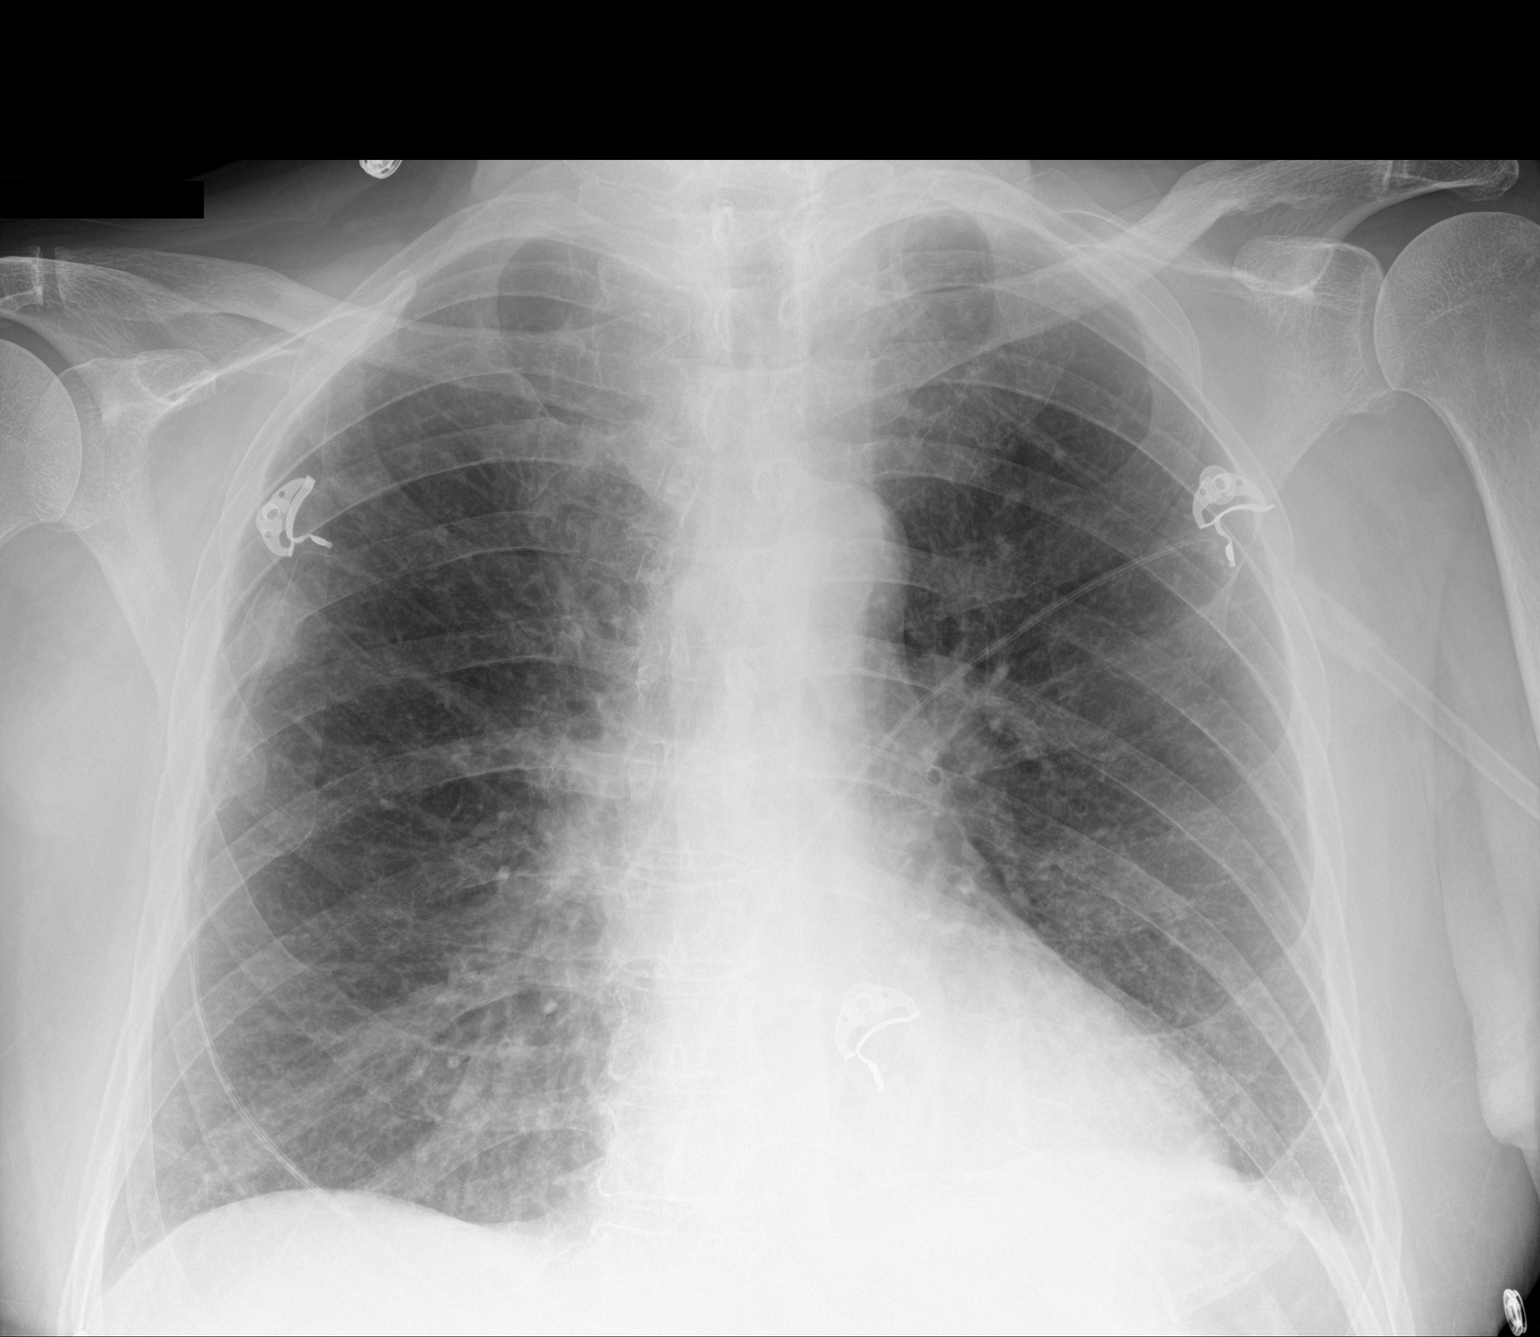

[1 of 1 positions shown; findings below may reference images not displayed]

FINDINGS: The heart, hila, and mediastinum are normal. No pneumothorax. No
nodules or masses. No focal infiltrates.
IMPRESSION: No active disease.

## 2020-01-22 MED ORDER — IPRATROPIUM-ALBUTEROL 0.5-2.5 (3) MG/3ML IN SOLN
3.0000 mL | Freq: Three times a day (TID) | RESPIRATORY_TRACT | Status: DC
  Administered 2020-01-22 – 2020-01-23 (×3): 3 mL via RESPIRATORY_TRACT
  Filled 2020-01-22 (×3): qty 3

## 2020-01-22 NOTE — Progress Notes (Signed)
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  He also complained of abdominal pain for 2 weeks.  Patient was hypotensive and tachycardic hypoxic and complained of abdominal pain.  CT abdomen/pelvis with contrast showed perforated viscus and pneumoperitoneum.  Patient was transferred from [facility] to [facility].  He was taken to the OR for exploratory laparotomy by Dr. Kieth Brightly on 01/17/2020.  He was found to have perforated pyloric channel ulcer and incisional hernia which was repaired as well.  He was intubated in the ICU and extubated on 01/18/2020.  He had an echo on 01/18/2020 which showed EF 55 to 60% with severe right ventricular hypokinesis.  Cardiology was consulted and it was felt picture was more likely chronic cor pulmonale due to lung disease and recommended bubble study with pulmonary referral at discharge for PFT eval and 6-minute test.  Cardiology signed off on 01/20/2020.  PCCM signed off on 01/19/2020.  [facility] was consulted by general surgery on 01/20/2020 for medical comanagement.    Subjective   Patient seen and examined, excellent diuresis with IV Lasix yesterday.  Blood pressure is soft today.   Assessment/Plan:     1. Acute hypoxemic respiratory failure-in setting of COPD with atypical pneumonia.  CTA negative for pulmonary embolism.  Continue pulmonary toilet, incentive spirometry, IV Zosyn, continue to wean off oxygen as tolerated.  Patient is currently requiring 2 L/min of oxygen via nasal cannula.  He is +3 L fluid overload.  He had excellent response to IV Lasix.  Will monitor, blood pressure is soft so we will avoid giving another dose of Lasix.  Will follow in a.m. 2. Hypokalemia-po  replete 3. COPD exacerbation-continue scheduled duo nebs, Dulera. 4. 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  He was on chronic prednisone which was discontinued in May and then restarted on steroid Dosepak which he completed on 12/16/2019.  Steroids are currently on hold.  He is to follow-up with rheumatology at discharge. 5. Perforated prepyloric ulcer with pneumoperitoneum-General surgery following.     COVID-19 Labs  No results for input(s): DDIMER, FERRITIN, LDH, CRP in the last 72 hours.  Lab Results  Component Value Date   SARSCOV2NAA NEGATIVE 01/17/2020   [place] NEGATIVE 11/30/2019   [place] NEGATIVE 08/18/2019     Scheduled medications:   . enoxaparin (LOVENOX) injection  40 mg Subcutaneous Q24H  . ipratropium-albuterol  3 mL Nebulization TID  . lidocaine  1 patch Transdermal Q24H  . mouth rinse  15 mL Mouth Rinse BID  . mometasone-formoterol  1 puff Inhalation BID  . nicotine  21 mg Transdermal Daily  . nystatin  5 mL Oral QID  . pantoprazole (PROTONIX) IV  40 mg Intravenous Q12H         CBG: Recent Labs  Lab 01/18/20 0154  GLUCAP 121*    SpO2: 94 % O2 Flow Rate (L/min): 2 L/min FiO2 (%): 40 %    CBC: Recent Labs  Lab 01/17/20 1454 01/17/20 1454 01/18/20 0029 01/18/20 0300 01/19/20 0530 01/20/20 0537 01/22/20 0016  WBC 9.4  --   --  18.1* 22.4* 12.4* 6.7  NEUTROABS 8.2*  --   --   --  20.6*  --   --  HGB 19.1*   < > 17.3* 16.5 14.5 16.5 16.6  HCT 60.0*   < > 51.0 53.0* 46.4 52.8* 50.6  MCV 88.9  --   --  90.8 91.5 90.3 86.9  PLT 346  --   --  334 339 415* 335   < > = values in this interval not displayed.    Basic Metabolic Panel: Recent Labs  Lab 01/17/20 1454 01/17/20 1454 01/18/20 0029 01/18/20 0300 01/19/20 0530 01/20/20 0537 01/22/20 0016  NA 134*   < > 141 134* 134* 141 132*  K 4.3   < > 4.2 4.3 3.7 3.2* 3.9  CL 93*  --   --  102  101 106 97*  CO2 30  --   --  26 26 27 26   GLUCOSE 120*  --   --  164* 152* 71 130*  BUN 20  --   --  22* 17 10 10   CREATININE 0.78  --   --  0.80 0.51* 0.46* 0.60*  CALCIUM 8.1*  --   --  7.1* 7.4* 7.6* 8.0*  MG  --   --   --  1.9 2.3 2.0  --   PHOS  --   --   --  5.2*  --   --   --    < > = values in this interval not displayed.     Liver Function Tests: Recent Labs  Lab 01/17/20 1454 01/19/20 0530 01/22/20 0016  AST 22 17 17   ALT 9 8 10   ALKPHOS 94 52 79  BILITOT 1.6* 0.8 1.1  PROT 8.1 5.9* 6.9  ALBUMIN 2.5* 1.8* 2.1*     Antibiotics: Anti-infectives (From admission, onward)   Start     Dose/Rate Route Frequency Ordered Stop   01/18/20 0800  vancomycin (VANCOREADY) IVPB 750 mg/150 mL  Status:  Discontinued        750 mg 150 mL/hr over 60 Minutes Intravenous Every 12 hours 01/17/20 1833 01/18/20 1004   01/18/20 0030  piperacillin-tazobactam (ZOSYN) IVPB 3.375 g        3.375 g 12.5 mL/hr over 240 Minutes Intravenous Every 8 hours 01/17/20 2350     01/17/20 2330  piperacillin-tazobactam (ZOSYN) IVPB 3.375 g  Status:  Discontinued        3.375 g 100 mL/hr over 30 Minutes Intravenous Every 8 hours 01/17/20 2324 01/17/20 2348   01/17/20 1845  vancomycin (VANCOREADY) IVPB 1500 mg/300 mL        1,500 mg 150 mL/hr over 120 Minutes Intravenous  Once 01/17/20 1820 01/18/20 0130   01/17/20 1800  piperacillin-tazobactam (ZOSYN) IVPB 3.375 g        3.375 g 100 mL/hr over 30 Minutes Intravenous  Once 01/17/20 1800 01/17/20 1858       DVT prophylaxis: Lovenox      Objective   Vitals:   01/22/20 0741 01/22/20 0743 01/22/20 1235 01/22/20 1433  BP:   97/71   Pulse:   74   Resp:   19   Temp:   97.7 F (36.5 C)   TempSrc:   Oral   SpO2: (!) 89% (!) 89% 93% 94%  Weight:      Height:        Intake/Output Summary (Last 24 hours) at 01/22/2020 1559 Last data filed at 01/22/2020 1227 Gross per 24 hour  Intake 1566.33 ml  Output 3020 ml  Net -1453.67 ml    10/15  1901 - 10/17 0700 In: 1566.3 [P.O.:960] Out: 7893 [  EXHBZ:1696; Drains:195]  Filed Weights   01/19/20 0500 01/20/20 0500 01/21/20 0500  Weight: 74.1 kg 71.6 kg 69 kg    Physical Examination:   General-appears in no acute distress Heart-S1-S2, regular, no murmur auscultated Lungs-clear to auscultation bilaterally, no wheezing or crackles auscultated Abdomen-soft, nontender, no organomegaly Extremities-no edema in the lower extremities Neuro-alert, oriented x3, no focal deficit noted   Data Reviewed:   Recent Results (from the past 240 hour(s))  Respiratory Panel by RT PCR (Flu A&B, Covid) - Nasopharyngeal Swab     Status: None   Collection Time: 01/17/20  2:54 PM   Specimen: Nasopharyngeal Swab  Result Value Ref Range Status   SARS Coronavirus 2 by RT PCR NEGATIVE NEGATIVE Final    Comment: (NOTE) SARS-CoV-2 target nucleic acids are NOT DETECTED.  The SARS-CoV-2 RNA is generally detectable in upper respiratoy specimens during the acute phase of infection. The lowest concentration of SARS-CoV-2 viral copies this assay can detect is 131 copies/mL. A negative result does not preclude SARS-Cov-2 infection and should not be used as the sole basis for treatment or other patient management decisions. A negative result may occur with  improper specimen collection/handling, submission of specimen other than nasopharyngeal swab, presence of viral mutation(s) within the areas targeted by this assay, and inadequate number of viral copies (<131 copies/mL). A negative result must be combined with clinical observations, patient history, and epidemiological information. The expected result is Negative.  Fact Sheet for Patients:  PinkCheek.be  Fact Sheet for Healthcare Providers:  GravelBags.it  This test is no t yet approved or cleared by the [place] FDA and  has been authorized for detection and/or diagnosis of  SARS-CoV-2 by FDA under an Emergency Use Authorization (EUA). This EUA will remain  in effect (meaning this test can be used) for the duration of the COVID-19 declaration under Section 564(b)(1) of the Act, 21 U.S.C. section 360bbb-3(b)(1), unless the authorization is terminated or revoked sooner.     Influenza A by PCR NEGATIVE NEGATIVE Final   Influenza B by PCR NEGATIVE NEGATIVE Final    Comment: (NOTE) The Xpert Xpress SARS-CoV-2/FLU/RSV assay is intended as an aid in  the diagnosis of influenza from Nasopharyngeal swab specimens and  should not be used as a sole basis for treatment. Nasal washings and  aspirates are unacceptable for Xpert Xpress SARS-CoV-2/FLU/RSV  testing.  Fact Sheet for Patients: PinkCheek.be  Fact Sheet for Healthcare Providers: GravelBags.it  This test is not yet approved or cleared by the [place] FDA and  has been authorized for detection and/or diagnosis of SARS-CoV-2 by  FDA under an Emergency Use Authorization (EUA). This EUA will remain  in effect (meaning this test can be used) for the duration of the  Covid-19 declaration under Section 564(b)(1) of the Act, 21  U.S.C. section 360bbb-3(b)(1), unless the authorization is  terminated or revoked. Performed at [facility], [address] [place], [place] [address]   Blood Culture (routine x 2)     Status: None (Preliminary result)   Collection Time: 01/17/20  2:54 PM   Specimen: Left Antecubital; Blood  Result Value Ref Range Status   Specimen Description LEFT ANTECUBITAL  Final   Special Requests   Final    BOTTLES DRAWN AEROBIC AND ANAEROBIC Blood Culture adequate volume   Culture   Final    NO GROWTH 4 DAYS Performed at [facility], [address], [place], [place] [address]    Report Status PENDING  Incomplete  Blood Culture (  routine x 2)     Status: None (Preliminary result)   Collection Time: 01/17/20  2:59 PM   Specimen:  BLOOD LEFT WRIST  Result Value Ref Range Status   Specimen Description BLOOD LEFT WRIST  Final   Special Requests   Final    BOTTLES DRAWN AEROBIC AND ANAEROBIC Blood Culture adequate volume   Culture   Final    NO GROWTH 4 DAYS Performed at [facility], [address] [place], [place] [address]    Report Status PENDING  Incomplete  MRSA PCR Screening     Status: None   Collection Time: 01/17/20  6:35 PM   Specimen: Nasal Mucosa; Nasopharyngeal  Result Value Ref Range Status   MRSA by PCR NEGATIVE NEGATIVE Final    Comment:        The GeneXpert MRSA Assay (FDA approved for NASAL specimens only), is one component of a comprehensive MRSA colonization surveillance program. It is not intended to diagnose MRSA infection nor to guide or monitor treatment for MRSA infections. Performed at [facility], [address], [place],  [address]     No results for input(s): LIPASE, AMYLASE in the last 168 hours. No results for input(s): AMMONIA in the last 168 hours.  Cardiac Enzymes: No results for input(s): CKTOTAL, CKMB, CKMBINDEX, TROPONINI in the last 168 hours. BNP (last 3 results) No results for input(s): BNP in the last 8760 hours.  ProBNP (last 3 results) No results for input(s): PROBNP in the last 8760 hours.  Studies:  DG Chest Port 1V same Day  Result Date: 01/22/2020 CLINICAL DATA:  COPD.  I LD. EXAM: PORTABLE CHEST 1 VIEW COMPARISON:  January 20, 2020 FINDINGS: The heart, hila, and mediastinum are normal. No pneumothorax. No nodules or masses. No focal infiltrates. IMPRESSION: No active disease. Electronically Signed   By: Dorise Bullion III M.D   On: 01/22/2020 14:20       Oswald Hillock   [facility] If 7PM-7AM, please contact night-coverage at [email or web address], Office  [phone]   01/22/2020, 3:59 PM  LOS: 5 days

## 2020-01-22 NOTE — Progress Notes (Signed)
Occupational Therapy Treatment Patient Details Name: Marcus [name] MRN: [id number] DOB: 1961/01/14 Today's Date: 01/22/2020    History of present illness Marcus [name] is an 59 y.o. 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 CT abdomen/pelvis with contrast revealed a perforated viscus with pneumoperitoneum.He was taken to the OR for exploratory laparotomy by Dr. Kieth Brightly on the evening of 10/12 and admitted to the ICU following the surgery. 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 The patient was extubated on 10/13 and presents now on 2 L.   OT comments  Pt readily willing to work with OT. Ambulated to bathroom and in hall with min guard assist and RW on 2L 02 with Sp02 of 92%. Pt educated in pursed lip breathing, cued for standing rest break with ambulation and to avoid rushing as he fatigued.   Follow Up Recommendations  Home health OT    Equipment Recommendations  Tub/shower seat    Recommendations for Other Services      Precautions / Restrictions Precautions Precautions: Fall Precaution Comments: JP drain, abdominal incision       Mobility Bed Mobility Overal bed mobility: Needs Assistance Bed Mobility: Supine to Sit     Supine to sit: HOB elevated;Supervision        Transfers Overall transfer level: Needs assistance Equipment used: Rolling walker (2 wheeled) Transfers: Sit to/from Stand Sit to Stand: Min guard         General transfer comment: no physical assist, cues for hand placement    Balance Overall balance assessment: Needs assistance   Sitting balance-Leahy Scale: Good       Standing balance-Leahy Scale: Poor Standing balance comment: reliant on RW                           ADL either  performed or assessed with clinical judgement   ADL Overall ADL's : Needs assistance/impaired     Grooming: Wash/dry hands;Min guard;Standing           Upper Body Dressing : Minimal assistance;Set up;Sitting Upper Body Dressing Details (indicate cue type and reason): front opening gown     Toilet Transfer: Min guard;Ambulation;RW   Toileting- Clothing Manipulation and Hygiene: Min guard (standing)       Functional mobility during ADLs: Min guard;Rolling walker (ambulated in hall) General ADL Comments: Instructed in pursed lip breathing, pt needs cues to take rest breaks, becomes more unsafe as he fatigues and begins rushing     Manufacturing systems engineer      Cognition Arousal/Alertness: Awake/alert Behavior During Therapy: Flat affect Overall Cognitive Status: Impaired/Different from baseline Area of Impairment: Problem solving;Safety/judgement                         Safety/Judgement: Decreased awareness of safety   Problem Solving: Slow processing;Decreased initiation          Exercises     Shoulder Instructions       General Comments      Pertinent Vitals/ Pain       Pain Assessment: Faces Faces Pain Scale: Hurts little more Pain Location: abdomen Pain Descriptors /  Indicators: Sore Pain Intervention(s): Monitored during session;Repositioned  Home Living                                          Prior Functioning/Environment              Frequency  Min 2X/week        Progress Toward Goals  OT Goals(current goals can now be found in the care plan section)  Progress towards OT goals: Progressing toward goals  Acute Rehab OT Goals Patient Stated Goal: improve independence OT Goal Formulation: With patient Time For Goal Achievement: 02/03/20 Potential to Achieve Goals: Good  Plan Discharge plan remains appropriate    Co-evaluation                 AM-PAC OT "6 Clicks" Daily Activity      Outcome Measure   Help from another person eating meals?: None Help from another person taking care of personal grooming?: A Little Help from another person toileting, which includes using toliet, bedpan, or urinal?: A Little Help from another person bathing (including washing, rinsing, drying)?: A Lot Help from another person to put on and taking off regular upper body clothing?: A Little Help from another person to put on and taking off regular lower body clothing?: A Lot 6 Click Score: 17    End of Session Equipment Utilized During Treatment: Rolling walker;Oxygen (2L)  OT Visit Diagnosis: Unsteadiness on feet (R26.81);Muscle weakness (generalized) (M62.81);Pain   Activity Tolerance Patient limited by fatigue   Patient Left in chair;with call bell/phone within reach;with nursing/sitter in room   Nurse Communication Mobility status        Time: 2229-7989 OT Time Calculation (min): 32 min  Charges: OT General Charges $OT Visit: 1 Visit OT Treatments $Self Care/Home Management : 23-37 mins  Nestor Lewandowsky, OTR/L Acute Rehabilitation Services Pager: [phone] Office: [phone]   Malka So 01/22/2020, 11:27 AM

## 2020-01-22 NOTE — Progress Notes (Addendum)
5 Days Post-Op   Subjective/Chief Complaint: none    Pt ok this am slightly SOB on O2 but no N/V   Objective: Vital signs in last 24 hours: Temp:  [97.9 F (36.6 C)-98 F (36.7 C)] 98 F (36.7 C) (10/17 0511) Pulse Rate:  [73-78] 73 (10/17 0511) Resp:  [19-20] 20 (10/17 0511) BP: (112-133)/(72-89) 112/72 (10/17 0511) SpO2:  [89 %-93 %] 89 % (10/17 0743) Last BM Date:  (PTA )  Intake/Output from previous day: 10/16 0701 - 10/17 0700 In: 1566.3 [P.O.:960; IV Piggyback:606.3] Out: 3245 [Urine:3150; Drains:95] Intake/Output this shift: No intake/output data recorded.   General: resting comfortably, NAD Neuro: alert and oriented, no focal deficits HEENT: NG in place with minimal gastric contents Resp: normal work of breathingon nasal cannula CV: RRR Abdomen: soft, nondistended, nontender to palpation.Midline incision clean and dry with honeycomb dressing in place. JP with serosanguinous drainage. Extremities: warm and well-perfused  Lab Results:  Recent Labs    01/20/20 0537 01/22/20 0016  WBC 12.4* 6.7  HGB 16.5 16.6  HCT 52.8* 50.6  PLT 415* 335   BMET Recent Labs    01/20/20 0537 01/22/20 0016  NA 141 132*  K 3.2* 3.9  CL 106 97*  CO2 27 26  GLUCOSE 71 130*  BUN 10 10  CREATININE 0.46* 0.60*  CALCIUM 7.6* 8.0*   PT/INR No results for input(s): LABPROT, INR in the last 72 hours. ABG No results for input(s): PHART, HCO3 in the last 72 hours.  Invalid input(s): PCO2, PO2  Studies/Results: DG CHEST PORT 1 VIEW  Result Date: 01/20/2020 CLINICAL DATA:  Hypoxia. EXAM: PORTABLE CHEST 1 VIEW COMPARISON:  Radiographs 01/17/2020 and 11/30/2019.  CT 01/17/2020. FINDINGS: 1126 hours. The heart size and mediastinal contours are stable. Diffuse interstitial prominence seen on the most recent studies has improved, associated with central airway thickening and tree-in-bud nodularity on CT. There is no confluent airspace opacity, pleural effusion or pneumothorax.  Old rib fracture noted on the right. Telemetry leads overlie the chest. IMPRESSION: Interval improvement in diffuse interstitial prominence suggesting improving atypical infection or inflammation. No focal airspace disease. Electronically Signed   By: Richardean Sale M.D.   On: 01/20/2020 11:56    Anti-infectives: Anti-infectives (From admission, onward)   Start     Dose/Rate Route Frequency Ordered Stop   01/18/20 0800  vancomycin (VANCOREADY) IVPB 750 mg/150 mL  Status:  Discontinued        750 mg 150 mL/hr over 60 Minutes Intravenous Every 12 hours 01/17/20 1833 01/18/20 1004   01/18/20 0030  piperacillin-tazobactam (ZOSYN) IVPB 3.375 g        3.375 g 12.5 mL/hr over 240 Minutes Intravenous Every 8 hours 01/17/20 2350     01/17/20 2330  piperacillin-tazobactam (ZOSYN) IVPB 3.375 g  Status:  Discontinued        3.375 g 100 mL/hr over 30 Minutes Intravenous Every 8 hours 01/17/20 2324 01/17/20 2348   01/17/20 1845  vancomycin (VANCOREADY) IVPB 1500 mg/300 mL        1,500 mg 150 mL/hr over 120 Minutes Intravenous  Once 01/17/20 1820 01/18/20 0130   01/17/20 1800  piperacillin-tazobactam (ZOSYN) IVPB 3.375 g        3.375 g 100 mL/hr over 30 Minutes Intravenous  Once 01/17/20 1800 01/17/20 1858      Assessment/Plan: s/p Procedure(s): EXPLORATORY LAPAROTOMY (N/A) HERNIA REPAIR INCISIONAL AND GRAHAM PATCH s/p Procedure(s): EXPLORATORY LAPAROTOMY (N/A) [place] PATCH  59 yo male with perforated prepyloric  ulcer POD 4 s/p ex lap and Engineer, structural. - FULL liquids. - Aggressive pulmonary toilet. Duonebs and IS. - Patient needs to mobilize today. PT ordered. - Continue zosyn - VTE: lovenox, SCDs - Dispo: inpatient LOS  LOS: 5 days    Turner Daniels MD  01/22/2020

## 2020-01-23 DIAGNOSIS — J9601 Acute respiratory failure with hypoxia: Secondary | ICD-10-CM | POA: Diagnosis not present

## 2020-01-23 LAB — CULTURE, BLOOD (ROUTINE X 2)
Culture: NO GROWTH
Culture: NO GROWTH
Special Requests: ADEQUATE
Special Requests: ADEQUATE

## 2020-01-23 LAB — BASIC METABOLIC PANEL
Anion gap: 11 (ref 5–15)
BUN: 9 mg/dL (ref 6–20)
CO2: 22 mmol/L (ref 22–32)
Calcium: 7.8 mg/dL — ABNORMAL LOW (ref 8.9–10.3)
Chloride: 100 mmol/L (ref 98–111)
Creatinine, Ser: 0.52 mg/dL — ABNORMAL LOW (ref 0.61–1.24)
GFR, Estimated: >60 mL/min (ref >60)
Glucose, Bld: 93 mg/dL (ref 70–99)
Potassium: 3.9 mmol/L (ref 3.5–5.1)
Sodium: 133 mmol/L — ABNORMAL LOW (ref 135–145)

## 2020-01-23 MED ORDER — MORPHINE SULFATE (PF) 2 MG/ML IV SOLN: 2.0000 mg | INTRAVENOUS | Status: DC | PRN

## 2020-01-23 MED ORDER — ACETAMINOPHEN 325 MG PO TABS
650.0000 mg | ORAL_TABLET | Freq: Four times a day (QID) | ORAL | Status: DC
  Administered 2020-01-23 – 2020-01-24 (×5): 650 mg via ORAL
  Filled 2020-01-23 (×5): qty 2

## 2020-01-23 MED ORDER — PANTOPRAZOLE SODIUM 40 MG PO TBEC
40.0000 mg | DELAYED_RELEASE_TABLET | Freq: Two times a day (BID) | ORAL | Status: DC
  Administered 2020-01-23 – 2020-01-24 (×2): 40 mg via ORAL
  Filled 2020-01-23 (×2): qty 1

## 2020-01-23 MED ORDER — IPRATROPIUM-ALBUTEROL 0.5-2.5 (3) MG/3ML IN SOLN
3.0000 mL | Freq: Four times a day (QID) | RESPIRATORY_TRACT | Status: DC
  Administered 2020-01-24 (×2): 3 mL via RESPIRATORY_TRACT
  Filled 2020-01-23 (×2): qty 3

## 2020-01-23 MED ORDER — IPRATROPIUM-ALBUTEROL 0.5-2.5 (3) MG/3ML IN SOLN
3.0000 mL | Freq: Four times a day (QID) | RESPIRATORY_TRACT | Status: DC
  Administered 2020-01-23 (×2): 3 mL via RESPIRATORY_TRACT
  Filled 2020-01-23 (×2): qty 3

## 2020-01-23 MED ORDER — METHOCARBAMOL 500 MG PO TABS
1000.0000 mg | ORAL_TABLET | Freq: Three times a day (TID) | ORAL | Status: DC
  Administered 2020-01-23 – 2020-01-24 (×4): 1000 mg via ORAL
  Filled 2020-01-23 (×4): qty 2

## 2020-01-23 MED ORDER — OXYCODONE HCL 5 MG PO TABS
5.0000 mg | ORAL_TABLET | ORAL | Status: DC | PRN
  Administered 2020-01-23 – 2020-01-24 (×5): 10 mg via ORAL
  Filled 2020-01-23 (×5): qty 2

## 2020-01-23 NOTE — Progress Notes (Signed)
Pt encouraged to get OOB to chair for lunch but he refused.

## 2020-01-23 NOTE — Progress Notes (Signed)
[facility] Progress Note  6 Days Post-Op  Subjective: Patient reports abdominal pain. Denies nausea. Tolerating FLD and +flatus. Has not been walking much secondary to pain. Does not wear oxygen at home usually.   Objective: Vital signs in last 24 hours: Temp:  [97.5 F (36.4 C)-98.2 F (36.8 C)] 98.2 F (36.8 C) (10/18 0537) Pulse Rate:  [73-77] 73 (10/18 0537) Resp:  [19-22] 22 (10/18 0537) BP: (97-106)/(70-71) 102/70 (10/18 0537) SpO2:  [91 %-94 %] 93 % (10/18 0844) Weight:  [66.7 kg] 66.7 kg (10/18 0400) Last BM Date: 01/22/20  Intake/Output from previous day: 10/17 0701 - 10/18 0700 In: 74 [P.O.:960; IV Piggyback:200] Out: 1565 [Urine:1500; Drains:65] Intake/Output this shift: Total I/O In: 240 [P.O.:240] Out: 400 [Urine:400]  PE: General: pleasant, WD, chronically ill appearing male who is laying in bed in NAD Heart: regular, rate, and rhythm.  Normal s1,s2. No obvious murmurs, gallops, or rubs noted.  Palpable radial and pedal pulses bilaterally Lungs: CTAB, no wheezes, rhonchi, or rales noted.  Respiratory effort nonlabored on supplemental oxygen  Abd: soft, appropriately ttp, ND, +BS, midline incision clean with staples present and honeycomb dressing, drain present with minimal SS fluid Psych: A&Ox3 with an appropriate affect.   Lab Results:  Recent Labs    01/22/20 0016  WBC 6.7  HGB 16.6  HCT 50.6  PLT 335   BMET Recent Labs    01/22/20 0016 01/23/20 0453  NA 132* 133*  K 3.9 3.9  CL 97* 100  CO2 26 22  GLUCOSE 130* 93  BUN 10 9  CREATININE 0.60* 0.52*  CALCIUM 8.0* 7.8*   PT/INR No results for input(s): LABPROT, INR in the last 72 hours. CMP     Component Value Date/Time   NA 133 (L) 01/23/2020 0453   K 3.9 01/23/2020 0453   CL 100 01/23/2020 0453   CO2 22 01/23/2020 0453   GLUCOSE 93 01/23/2020 0453   BUN 9 01/23/2020 0453   CREATININE 0.52 (L) 01/23/2020 0453   CALCIUM 7.8 (L) 01/23/2020 0453   PROT 6.9 01/22/2020  0016   ALBUMIN 2.1 (L) 01/22/2020 0016   AST 17 01/22/2020 0016   ALT 10 01/22/2020 0016   ALKPHOS 79 01/22/2020 0016   BILITOT 1.1 01/22/2020 0016   GFRNONAA >60 01/23/2020 0453   GFRAA >60 11/30/2019 1938   Lipase     Component Value Date/Time   LIPASE 52 (H) 11/30/2019 1938       Studies/Results: DG Chest Port 1V same Day  Result Date: 01/22/2020 CLINICAL DATA:  COPD.  I LD. EXAM: PORTABLE CHEST 1 VIEW COMPARISON:  January 20, 2020 FINDINGS: The heart, hila, and mediastinum are normal. No pneumothorax. No nodules or masses. No focal infiltrates. IMPRESSION: No active disease. Electronically Signed   By: Dorise Bullion III M.D   On: 01/22/2020 14:20    Anti-infectives: Anti-infectives (From admission, onward)   Start     Dose/Rate Route Frequency Ordered Stop   01/18/20 0800  vancomycin (VANCOREADY) IVPB 750 mg/150 mL  Status:  Discontinued        750 mg 150 mL/hr over 60 Minutes Intravenous Every 12 hours 01/17/20 1833 01/18/20 1004   01/18/20 0030  piperacillin-tazobactam (ZOSYN) IVPB 3.375 g        3.375 g 12.5 mL/hr over 240 Minutes Intravenous Every 8 hours 01/17/20 2350     01/17/20 2330  piperacillin-tazobactam (ZOSYN) IVPB 3.375 g  Status:  Discontinued        3.375 g  100 mL/hr over 30 Minutes Intravenous Every 8 hours 01/17/20 2324 01/17/20 2348   01/17/20 1845  vancomycin (VANCOREADY) IVPB 1500 mg/300 mL        1,500 mg 150 mL/hr over 120 Minutes Intravenous  Once 01/17/20 1820 01/18/20 0130   01/17/20 1800  piperacillin-tazobactam (ZOSYN) IVPB 3.375 g        3.375 g 100 mL/hr over 30 Minutes Intravenous  Once 01/17/20 1800 01/17/20 1858       Assessment/Plan COPD with current tobacco abuse - wean to room air  RA Felty syndrome  Perforated prepyloric ulcer S/P exploratory laparotomy and Phillip Heal patch 01/18/20 Dr. Kieth Brightly - POD#5 - patient tolerating FLD and passing flatus - advance to soft diet - start PO pain meds and try to limit IV pain meds -  mobilize - H. Pylori ordered - change IV protonix to PO  - likely d/c drain prior to home, possible discharge home in 1-2 days  FEN: soft diet VTE: lovenox ID: Zosyn 10/12>10/18  LOS: 6 days    Norm Parcel , [facility] 01/23/2020, 10:54 AM Please see Amion for pager number during day hours 7:00am-4:30pm

## 2020-01-23 NOTE — Progress Notes (Signed)
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  He also complained of abdominal pain for 2 weeks.  Patient was hypotensive and tachycardic hypoxic and complained of abdominal pain.  CT abdomen/pelvis with contrast showed perforated viscus and pneumoperitoneum.  Patient was transferred from [place] hospital to [facility] long.  He was taken to the OR for exploratory laparotomy by Dr. Kieth Brightly on 01/17/2020.  He was found to have perforated pyloric channel ulcer and incisional hernia which was repaired as well.  He was intubated in the ICU and extubated on 01/18/2020.  He had an echo on 01/18/2020 which showed EF 55 to 60% with severe right ventricular hypokinesis.  Cardiology was consulted and it was felt picture was more likely chronic cor pulmonale due to lung disease and recommended bubble study with pulmonary referral at discharge for PFT eval and 6-minute test.  Cardiology signed off on 01/20/2020.  PCCM signed off on 01/19/2020.  [facility] was consulted by general surgery on 01/20/2020 for medical comanagement.    Subjective   Patient seen and examined, breathing much better.   Assessment/Plan:     1. Acute hypoxemic respiratory failure-in setting of COPD with atypical pneumonia.  CTA negative for pulmonary embolism.  Continue pulmonary toilet, incentive spirometry, IV Zosyn, continue to wean off oxygen as tolerated.  Patient is currently requiring 2 L/min of oxygen via nasal cannula. He was +3 L fluid overload and excellent response to IV Lasix. Currently O2 sats 94% on 2 L/min of oxygen. Repeat chest x-ray today shows no acute abnormality. Continue to wean off oxygen as tolerated.  2. Hypokalemia-po replete 3. COPD  exacerbation-continue scheduled duo nebs, Dulera. 4. 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  He was on chronic prednisone which was discontinued in May and then restarted on steroid Dosepak which he completed on 12/16/2019.  Steroids are currently on hold.  He is to follow-up with rheumatology at discharge. 5. Perforated prepyloric ulcer with pneumoperitoneum-General surgery following.     COVID-19 Labs  No results for input(s): DDIMER, FERRITIN, LDH, CRP in the last 72 hours.  Lab Results  Component Value Date   SARSCOV2NAA NEGATIVE 01/17/2020   [place] NEGATIVE 11/30/2019   [place] NEGATIVE 08/18/2019     Scheduled medications:   . acetaminophen  650 mg Oral Q6H  . enoxaparin (LOVENOX) injection  40 mg Subcutaneous Q24H  . ipratropium-albuterol  3 mL Nebulization Q6H  . lidocaine  1 patch Transdermal Q24H  . mouth rinse  15 mL Mouth Rinse BID  . methocarbamol  1,000 mg Oral Q8H  . mometasone-formoterol  1 puff Inhalation BID  . nicotine  21 mg Transdermal Daily  . nystatin  5 mL Oral QID  . pantoprazole  40 mg Oral BID         CBG: Recent Labs  Lab 01/18/20 0154  GLUCAP 121*    SpO2: 90 % O2 Flow Rate (L/min): 2 L/min FiO2 (%): 40 %    CBC: Recent Labs  Lab 01/17/20 1454 01/17/20 1454 01/18/20 0029 01/18/20 0300 01/19/20 0530 01/20/20 0537 01/22/20 0016  WBC 9.4  --   --  18.1* 22.4* 12.4* 6.7  NEUTROABS 8.2*  --   --   --  20.6*  --   --   HGB 19.1*   < > 17.3* 16.5 14.5 16.5 16.6  HCT 60.0*   < > 51.0 53.0* 46.4 52.8* 50.6  MCV 88.9  --   --  90.8 91.5 90.3 86.9  PLT 346  --   --  334 339 415* 335   < > = values in this interval not displayed.    Basic Metabolic Panel: Recent Labs  Lab 01/18/20 0300 01/19/20 0530 01/20/20 0537 01/22/20 0016 01/23/20 0453  NA 134* 134* 141 132* 133*  K 4.3 3.7 3.2* 3.9 3.9  CL 102 101 106 97* 100  CO2 26 26 27 26  22   GLUCOSE 164* 152* 71 130* 93  BUN 22* 17 10 10 9   CREATININE 0.80 0.51* 0.46* 0.60* 0.52*  CALCIUM 7.1* 7.4* 7.6* 8.0* 7.8*  MG 1.9 2.3 2.0  --   --   PHOS 5.2*  --   --   --   --      Liver Function Tests: Recent Labs  Lab 01/17/20 1454 01/19/20 0530 01/22/20 0016  AST 22 17 17   ALT 9 8 10   ALKPHOS 94 52 79  BILITOT 1.6* 0.8 1.1  PROT 8.1 5.9* 6.9  ALBUMIN 2.5* 1.8* 2.1*     Antibiotics: Anti-infectives (From admission, onward)   Start     Dose/Rate Route Frequency Ordered Stop   01/18/20 0800  vancomycin (VANCOREADY) IVPB 750 mg/150 mL  Status:  Discontinued        750 mg 150 mL/hr over 60 Minutes Intravenous Every 12 hours 01/17/20 1833 01/18/20 1004   01/18/20 0030  piperacillin-tazobactam (ZOSYN) IVPB 3.375 g  Status:  Discontinued        3.375 g 12.5 mL/hr over 240 Minutes Intravenous Every 8 hours 01/17/20 2350 01/23/20 1057   01/17/20 2330  piperacillin-tazobactam (ZOSYN) IVPB 3.375 g  Status:  Discontinued        3.375 g 100 mL/hr over 30 Minutes Intravenous Every 8 hours 01/17/20 2324 01/17/20 2348   01/17/20 1845  vancomycin (VANCOREADY) IVPB 1500 mg/300 mL        1,500 mg 150 mL/hr over 120 Minutes Intravenous  Once 01/17/20 1820 01/18/20 0130   01/17/20 1800  piperacillin-tazobactam (ZOSYN) IVPB 3.375 g        3.375 g 100 mL/hr over 30 Minutes Intravenous  Once 01/17/20 1800 01/17/20 1858       DVT prophylaxis: Lovenox      Objective   Vitals:   01/23/20 0537 01/23/20 0844 01/23/20 1347 01/23/20 1356  BP: 102/70   108/67  Pulse: 73   78  Resp: (!) 22   18  Temp: 98.2 F (36.8 C)   97.9 F (36.6 C)  TempSrc: Oral   Oral  SpO2: 91% 93% 94% 90%  Weight:      Height:        Intake/Output Summary (Last 24 hours) at 01/23/2020 1459 Last data filed at 01/23/2020 1400 Gross per 24 hour  Intake 920 ml  Output 1640 ml  Net -720 ml    10/16 1901 - 10/18 0700 In: 1796.3 [P.O.:1440] Out: 2865 [Urine:2750; Drains:115]  Filed Weights    01/20/20 0500 01/21/20 0500 01/23/20 0400  Weight: 71.6 kg 69 kg 66.7 kg    Physical Examination:  General-appears in no acute distress Heart-S1-S2, regular, no murmur auscultated Lungs-clear to auscultation bilaterally, no wheezing or crackles auscultated Abdomen-soft, nontender, no organomegaly Extremities-no edema in the lower extremities Neuro-alert, oriented x3,  no focal deficit noted  Data Reviewed:   Recent Results (from the past 240 hour(s))  Respiratory Panel by RT PCR (Flu A&B, Covid) - Nasopharyngeal Swab     Status: None   Collection Time: 01/17/20  2:54 PM   Specimen: Nasopharyngeal Swab  Result Value Ref Range Status   SARS Coronavirus 2 by RT PCR NEGATIVE NEGATIVE Final    Comment: (NOTE) SARS-CoV-2 target nucleic acids are NOT DETECTED.  The SARS-CoV-2 RNA is generally detectable in upper respiratoy specimens during the acute phase of infection. The lowest concentration of SARS-CoV-2 viral copies this assay can detect is 131 copies/mL. A negative result does not preclude SARS-Cov-2 infection and should not be used as the sole basis for treatment or other patient management decisions. A negative result may occur with  improper specimen collection/handling, submission of specimen other than nasopharyngeal swab, presence of viral mutation(s) within the areas targeted by this assay, and inadequate number of viral copies (<131 copies/mL). A negative result must be combined with clinical observations, patient history, and epidemiological information. The expected result is Negative.  Fact Sheet for Patients:  PinkCheek.be  Fact Sheet for Healthcare Providers:  GravelBags.it  This test is no t yet approved or cleared by the [place] FDA and  has been authorized for detection and/or diagnosis of SARS-CoV-2 by FDA under an Emergency Use Authorization (EUA). This EUA will remain  in effect (meaning  this test can be used) for the duration of the COVID-19 declaration under Section 564(b)(1) of the Act, 21 U.S.C. section 360bbb-3(b)(1), unless the authorization is terminated or revoked sooner.     Influenza A by PCR NEGATIVE NEGATIVE Final   Influenza B by PCR NEGATIVE NEGATIVE Final    Comment: (NOTE) The Xpert Xpress SARS-CoV-2/FLU/RSV assay is intended as an aid in  the diagnosis of influenza from Nasopharyngeal swab specimens and  should not be used as a sole basis for treatment. Nasal washings and  aspirates are unacceptable for Xpert Xpress SARS-CoV-2/FLU/RSV  testing.  Fact Sheet for Patients: PinkCheek.be  Fact Sheet for Healthcare Providers: GravelBags.it  This test is not yet approved or cleared by the [place] FDA and  has been authorized for detection and/or diagnosis of SARS-CoV-2 by  FDA under an Emergency Use Authorization (EUA). This EUA will remain  in effect (meaning this test can be used) for the duration of the  Covid-19 declaration under Section 564(b)(1) of the Act, 21  U.S.C. section 360bbb-3(b)(1), unless the authorization is  terminated or revoked. Performed at [facility], [address], [place], [place] [address]   Blood Culture (routine x 2)     Status: None   Collection Time: 01/17/20  2:54 PM   Specimen: Left Antecubital; Blood  Result Value Ref Range Status   Specimen Description LEFT ANTECUBITAL  Final   Special Requests   Final    BOTTLES DRAWN AEROBIC AND ANAEROBIC Blood Culture adequate volume   Culture   Final    NO GROWTH 6 DAYS Performed at [facility], [address], [place], [place] [address]    Report Status 01/23/2020 FINAL  Final  Blood Culture (routine x 2)     Status: None   Collection Time: 01/17/20  2:59 PM   Specimen: BLOOD LEFT WRIST  Result Value Ref Range Status   Specimen Description BLOOD LEFT WRIST  Final   Special Requests   Final    BOTTLES DRAWN  AEROBIC AND ANAEROBIC Blood Culture adequate volume   Culture  Final    NO GROWTH 6 DAYS Performed at [facility], [address] [place], [place] [address]    Report Status 01/23/2020 FINAL  Final  MRSA PCR Screening     Status: None   Collection Time: 01/17/20  6:35 PM   Specimen: Nasal Mucosa; Nasopharyngeal  Result Value Ref Range Status   MRSA by PCR NEGATIVE NEGATIVE Final    Comment:        The GeneXpert MRSA Assay (FDA approved for NASAL specimens only), is one component of a comprehensive MRSA colonization surveillance program. It is not intended to diagnose MRSA infection nor to guide or monitor treatment for MRSA infections. Performed at [facility], [address], [place], [place] [address]     No results for input(s): LIPASE, AMYLASE in the last 168 hours. No results for input(s): AMMONIA in the last 168 hours.  Cardiac Enzymes: No results for input(s): CKTOTAL, CKMB, CKMBINDEX, TROPONINI in the last 168 hours. BNP (last 3 results) No results for input(s): BNP in the last 8760 hours.  ProBNP (last 3 results) No results for input(s): PROBNP in the last 8760 hours.  Studies:  DG Chest Port 1V same Day  Result Date: 01/22/2020 CLINICAL DATA:  COPD.  I LD. EXAM: PORTABLE CHEST 1 VIEW COMPARISON:  January 20, 2020 FINDINGS: The heart, hila, and mediastinum are normal. No pneumothorax. No nodules or masses. No focal infiltrates. IMPRESSION: No active disease. Electronically Signed   By: Dorise Bullion III M.D   On: 01/22/2020 14:20       Oswald Hillock   [facility] If 7PM-7AM, please contact night-coverage at [email or web address], Office  [phone]   01/23/2020, 2:59 PM  LOS: 6 days

## 2020-01-23 NOTE — Progress Notes (Signed)
PT Cancellation Note  Patient Details Name: Marcus [name] MRN: [id number] DOB: Mar 03, 1961   Cancelled Treatment:    Reason Eval/Treat Not Completed: Fatigue/lethargy limiting ability to participate, reports abdominal pain.    Claretha Cooper 01/23/2020, 3:37 PM [place] Pager [phone] Office [phone]

## 2020-01-24 DIAGNOSIS — J449 Chronic obstructive pulmonary disease, unspecified: Secondary | ICD-10-CM | POA: Diagnosis not present

## 2020-01-24 DIAGNOSIS — R198 Other specified symptoms and signs involving the digestive system and abdomen: Secondary | ICD-10-CM

## 2020-01-24 DIAGNOSIS — M0500 Felty's syndrome, unspecified site: Secondary | ICD-10-CM | POA: Diagnosis not present

## 2020-01-24 DIAGNOSIS — J9601 Acute respiratory failure with hypoxia: Secondary | ICD-10-CM | POA: Diagnosis not present

## 2020-01-24 DIAGNOSIS — E876 Hypokalemia: Secondary | ICD-10-CM | POA: Diagnosis not present

## 2020-01-24 LAB — H. PYLORI ANTIBODY, IGG: H Pylori IgG: 0.26 Index Value (ref 0.00–0.79)

## 2020-01-24 MED ORDER — ACETAMINOPHEN 325 MG PO TABS
650.0000 mg | ORAL_TABLET | Freq: Four times a day (QID) | ORAL | Status: DC
Start: 2020-01-24 — End: 2020-06-12

## 2020-01-24 MED ORDER — OXYCODONE HCL 5 MG PO TABS
5.0000 mg | ORAL_TABLET | ORAL | 0 refills | Status: DC | PRN
Start: 2020-01-24 — End: 2020-06-12

## 2020-01-24 MED ORDER — MOMETASONE FURO-FORMOTEROL FUM 200-5 MCG/ACT IN AERO: 1.0000 | INHALATION_SPRAY | Freq: Two times a day (BID) | RESPIRATORY_TRACT | 1 refills | Status: DC

## 2020-01-24 MED ORDER — METHOCARBAMOL 500 MG PO TABS
1000.0000 mg | ORAL_TABLET | Freq: Three times a day (TID) | ORAL | 2 refills | Status: DC
Start: 2020-01-24 — End: 2020-06-12

## 2020-01-24 MED ORDER — ALBUTEROL SULFATE (2.5 MG/3ML) 0.083% IN NEBU: 2.5000 mg | INHALATION_SOLUTION | Freq: Four times a day (QID) | RESPIRATORY_TRACT | 1 refills | Status: DC | PRN

## 2020-01-24 NOTE — Progress Notes (Signed)
Pt discharge instructions given with follow up appoinments and Rx needs to be pick up at his pharmacy. Brother at bedside and will take pt home. Pt will go home with home oxygen that was delivered here.

## 2020-01-24 NOTE — TOC Progression Note (Signed)
Transition of Care [facility]) - Progression Note    Patient Details  Name: Marcus [name] MRN: [id number] Date of Birth: 30-Oct-1960  [facility] CM/SW Contact  Joaquin Courts, RN Phone Number: 01/24/2020, 1:52 PM  Clinical Narrative:    CM spoke with patient regarding [place] and DME needs.  Patient declines [place] and DME tub bench.  [employer] to provide home oxygen.    Expected Discharge Plan: [place] Barriers to Discharge: No Barriers Identified  Expected Discharge Plan and Services Expected Discharge Plan: [place]   Discharge Planning Services: CM Consult   Living arrangements for the past 2 months: Single Family Home Expected Discharge Date: 01/24/20               DME Arranged: Oxygen DME Agency: [employer] Date DME Agency Contacted: 01/24/20 Time DME Agency Contacted: [phone] Representative spoke with at DME Agency: [place]: Patient Refused [place]           Social Determinants of Health ([place]) Interventions    Readmission Risk Interventions No flowsheet data found.

## 2020-01-24 NOTE — Progress Notes (Addendum)
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  He also complained of abdominal pain for 2 weeks.  Patient was hypotensive and tachycardic hypoxic and complained of abdominal pain.  CT abdomen/pelvis with contrast showed perforated viscus and pneumoperitoneum.  Patient was transferred from [place] [facility] to [facility].  He was taken to the OR for exploratory laparotomy by Dr. Kieth Brightly on 01/17/2020.  He was found to have perforated pyloric channel ulcer and incisional hernia which was repaired as well.  He was intubated in the ICU and extubated on 01/18/2020.  He had an echo on 01/18/2020 which showed EF 55 to 60% with severe right ventricular hypokinesis.  Cardiology was consulted and it was felt picture was more likely chronic cor pulmonale due to lung disease and recommended bubble study with pulmonary referral at discharge for PFT eval and 6-minute test.  Cardiology signed off on 01/20/2020.  PCCM signed off on 01/19/2020.  TR[facility] was consulted by general surgery on 01/20/2020 for medical comanagement.    Subjective   Patient seen and examined, denies shortness of breath.  Still requiring 2 L/min of oxygen.   Assessment/Plan:     1. Acute hypoxemic respiratory failure-in setting of COPD with atypical pneumonia.  CTA negative for pulmonary embolism.  Continue pulmonary toilet, incentive spirometry, IV Zosyn was stopped on 01/23/2020, continue to wean off oxygen as tolerated.  Patient is currently requiring 2 L/min of oxygen via nasal cannula. He was +3 L fluid overload and excellent response to IV Lasix. Currently O2 sats 94% on 2 L/min of oxygen. Repeat chest x-ray today shows no acute abnormality. Continue to wean off oxygen  as tolerated.  Patient can go home on oxygen.  Oxygen can be weaned off as outpatient 2. Hypokalemia-po replete 3. COPD exacerbation-continue scheduled duo nebs, Dulera.  Oxygen via nasal cannula 2 L/min as above. 4. 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  He was on chronic prednisone which was discontinued in May and then restarted on steroid Dosepak which he completed on 12/16/2019.  Steroids are currently on hold.  He is to follow-up with rheumatology at discharge. 5. Perforated prepyloric ulcer with pneumoperitoneum-General surgery following.     COVID-19 Labs  No results for input(s): DDIMER, FERRITIN, LDH, CRP in the last 72 hours.  Lab Results  Component Value Date   SARSCOV2NAA NEGATIVE 01/17/2020   [place] NEGATIVE 11/30/2019   [place] NEGATIVE 08/18/2019     Scheduled medications:   . acetaminophen  650 mg Oral Q6H  . enoxaparin (LOVENOX) injection  40 mg Subcutaneous Q24H  . ipratropium-albuterol  3 mL Nebulization Q6H WA  . lidocaine  1 patch Transdermal Q24H  . mouth rinse  15 mL Mouth Rinse BID  . methocarbamol  1,000 mg Oral Q8H  . mometasone-formoterol  1 puff Inhalation BID  . nicotine  21 mg Transdermal Daily  . nystatin  5 mL Oral QID  . pantoprazole  40 mg Oral BID         CBG: Recent Labs  Lab 01/18/20 0154  GLUCAP 121*    SpO2: 92 % O2 Flow Rate (L/min): 2 L/min FiO2 (%): 40 %    CBC: Recent Labs  Lab 01/17/20 1454 01/17/20 1454 01/18/20 0029 01/18/20 0300 01/19/20 0530 01/20/20 0537 01/22/20 0016  WBC 9.4  --   --  18.1* 22.4* 12.4* 6.7  NEUTROABS 8.2*  --   --   --  20.6*  --   --   HGB 19.1*   < > 17.3* 16.5 14.5 16.5 16.6  HCT 60.0*   < > 51.0 53.0* 46.4 52.8* 50.6  MCV 88.9  --   --  90.8 91.5 90.3 86.9  PLT 346  --   --  334 339 415* 335   < > = values in this interval not displayed.    Basic Metabolic Panel: Recent Labs  Lab  01/18/20 0300 01/19/20 0530 01/20/20 0537 01/22/20 0016 01/23/20 0453  NA 134* 134* 141 132* 133*  K 4.3 3.7 3.2* 3.9 3.9  CL 102 101 106 97* 100  CO2 26 26 27 26 22   GLUCOSE 164* 152* 71 130* 93  BUN 22* 17 10 10 9   CREATININE 0.80 0.51* 0.46* 0.60* 0.52*  CALCIUM 7.1* 7.4* 7.6* 8.0* 7.8*  MG 1.9 2.3 2.0  --   --   PHOS 5.2*  --   --   --   --      Liver Function Tests: Recent Labs  Lab 01/17/20 1454 01/19/20 0530 01/22/20 0016  AST 22 17 17   ALT 9 8 10   ALKPHOS 94 52 79  BILITOT 1.6* 0.8 1.1  PROT 8.1 5.9* 6.9  ALBUMIN 2.5* 1.8* 2.1*     Antibiotics: Anti-infectives (From admission, onward)   Start     Dose/Rate Route Frequency Ordered Stop   01/18/20 0800  vancomycin (VANCOREADY) IVPB 750 mg/150 mL  Status:  Discontinued        750 mg 150 mL/hr over 60 Minutes Intravenous Every 12 hours 01/17/20 1833 01/18/20 1004   01/18/20 0030  piperacillin-tazobactam (ZOSYN) IVPB 3.375 g  Status:  Discontinued        3.375 g 12.5 mL/hr over 240 Minutes Intravenous Every 8 hours 01/17/20 2350 01/23/20 1057   01/17/20 2330  piperacillin-tazobactam (ZOSYN) IVPB 3.375 g  Status:  Discontinued        3.375 g 100 mL/hr over 30 Minutes Intravenous Every 8 hours 01/17/20 2324 01/17/20 2348   01/17/20 1845  vancomycin (VANCOREADY) IVPB 1500 mg/300 mL        1,500 mg 150 mL/hr over 120 Minutes Intravenous  Once 01/17/20 1820 01/18/20 0130   01/17/20 1800  piperacillin-tazobactam (ZOSYN) IVPB 3.375 g        3.375 g 100 mL/hr over 30 Minutes Intravenous  Once 01/17/20 1800 01/17/20 1858       DVT prophylaxis: Lovenox      Objective   Vitals:   01/24/20 0836 01/24/20 0900 01/24/20 0955 01/24/20 0959  BP:      Pulse:      Resp:      Temp:      TempSrc:      SpO2: (!) 83% 92% (!) 88% 92%  Weight:      Height:        Intake/Output Summary (Last 24 hours) at 01/24/2020 1157 Last data filed at 01/24/2020 1054 Gross per 24 hour  Intake 480 ml  Output 1380 ml  Net  -900 ml    10/17 1901 - 10/19 0700 In: 920 [P.O.:720] Out: 2540 [Urine:2500; Drains:40]  Filed Weights   01/21/20 0500 01/23/20 0400 01/24/20 0440  Weight: 69 kg 66.7 kg 66 kg  Physical Examination:  General-appears in no acute distress Heart-S1-S2, regular, no murmur auscultated Lungs-clear to auscultation bilaterally, no wheezing or crackles auscultated Abdomen-soft, nontender, no organomegaly Extremities-no edema in the lower extremities Neuro-alert, oriented x3, no focal deficit noted  Data Reviewed:   Recent Results (from the past 240 hour(s))  Respiratory Panel by RT PCR (Flu A&B, Covid) - Nasopharyngeal Swab     Status: None   Collection Time: 01/17/20  2:54 PM   Specimen: Nasopharyngeal Swab  Result Value Ref Range Status   SARS Coronavirus 2 by RT PCR NEGATIVE NEGATIVE Final    Comment: (NOTE) SARS-CoV-2 target nucleic acids are NOT DETECTED.  The SARS-CoV-2 RNA is generally detectable in upper respiratoy specimens during the acute phase of infection. The lowest concentration of SARS-CoV-2 viral copies this assay can detect is 131 copies/mL. A negative result does not preclude SARS-Cov-2 infection and should not be used as the sole basis for treatment or other patient management decisions. A negative result may occur with  improper specimen collection/handling, submission of specimen other than nasopharyngeal swab, presence of viral mutation(s) within the areas targeted by this assay, and inadequate number of viral copies (<131 copies/mL). A negative result must be combined with clinical observations, patient history, and epidemiological information. The expected result is Negative.  Fact Sheet for Patients:  PinkCheek.be  Fact Sheet for Healthcare Providers:  GravelBags.it  This test is no t yet approved or cleared by the [place] FDA and  has been authorized for detection and/or diagnosis of  SARS-CoV-2 by FDA under an Emergency Use Authorization (EUA). This EUA will remain  in effect (meaning this test can be used) for the duration of the COVID-19 declaration under Section 564(b)(1) of the Act, 21 U.S.C. section 360bbb-3(b)(1), unless the authorization is terminated or revoked sooner.     Influenza A by PCR NEGATIVE NEGATIVE Final   Influenza B by PCR NEGATIVE NEGATIVE Final    Comment: (NOTE) The Xpert Xpress SARS-CoV-2/FLU/RSV assay is intended as an aid in  the diagnosis of influenza from Nasopharyngeal swab specimens and  should not be used as a sole basis for treatment. Nasal washings and  aspirates are unacceptable for Xpert Xpress SARS-CoV-2/FLU/RSV  testing.  Fact Sheet for Patients: PinkCheek.be  Fact Sheet for Healthcare Providers: GravelBags.it  This test is not yet approved or cleared by the [place] FDA and  has been authorized for detection and/or diagnosis of SARS-CoV-2 by  FDA under an Emergency Use Authorization (EUA). This EUA will remain  in effect (meaning this test can be used) for the duration of the  Covid-19 declaration under Section 564(b)(1) of the Act, 21  U.S.C. section 360bbb-3(b)(1), unless the authorization is  terminated or revoked. Performed at [facility], [address] [place], [place] [address]   Blood Culture (routine x 2)     Status: None   Collection Time: 01/17/20  2:54 PM   Specimen: Left Antecubital; Blood  Result Value Ref Range Status   Specimen Description LEFT ANTECUBITAL  Final   Special Requests   Final    BOTTLES DRAWN AEROBIC AND ANAEROBIC Blood Culture adequate volume   Culture   Final    NO GROWTH 6 DAYS Performed at [facility], [address] [place], [place] [address]    Report Status 01/23/2020 FINAL  Final  Blood Culture (routine x 2)     Status: None   Collection Time: 01/17/20  2:59 PM   Specimen: BLOOD LEFT WRIST  Result Value Ref  Range Status   Specimen Description BLOOD LEFT WRIST  Final   Special Requests   Final    BOTTLES DRAWN AEROBIC AND ANAEROBIC Blood Culture adequate volume   Culture   Final    NO GROWTH 6 DAYS Performed at [facility], [address], [place], [place] [address]    Report Status 01/23/2020 FINAL  Final  MRSA PCR Screening     Status: None   Collection Time: 01/17/20  6:35 PM   Specimen: Nasal Mucosa; Nasopharyngeal  Result Value Ref Range Status   MRSA by PCR NEGATIVE NEGATIVE Final    Comment:        The GeneXpert MRSA Assay (FDA approved for NASAL specimens only), is one component of a comprehensive MRSA colonization surveillance program. It is not intended to diagnose MRSA infection nor to guide or monitor treatment for MRSA infections. Performed at [facility], [address] [place], [place] [address]         [facility] If 7PM-7AM, please contact night-coverage at [email or web address], Office  [phone]   01/24/2020, 11:57 AM  LOS: 7 days

## 2020-01-24 NOTE — Discharge Instructions (Signed)
[facility]      [facility] [phone]  OPEN ABDOMINAL SURGERY: POST OP INSTRUCTIONS  Always review your discharge instruction sheet given to you by the facility where your surgery was performed.  IF YOU HAVE DISABILITY OR FAMILY LEAVE FORMS, YOU MUST BRING THEM TO THE OFFICE FOR PROCESSING.  PLEASE DO NOT GIVE THEM TO YOUR DOCTOR.  1. A prescription for pain medication may be given to you upon discharge.  Take your pain medication as prescribed, if needed.  If narcotic pain medicine is not needed, then you may take acetaminophen (Tylenol) or ibuprofen (Advil) as needed. 2. Take your usually prescribed medications unless otherwise directed. 3. If you need a refill on your pain medication, please contact your pharmacy. They will contact our office to request authorization.  Prescriptions will not be filled after 5pm or on week-ends. 4. You should follow a light diet the first few days after arrival home, such as soup and crackers, pudding, etc.unless your doctor has advised otherwise. A high-fiber, low fat diet can be resumed as tolerated.   Be sure to include lots of fluids daily. Most patients will experience some swelling and bruising on the chest and neck area.  Ice packs will help.  Swelling and bruising can take several days to resolve 5. Most patients will experience some swelling and bruising in the area of the incision. Ice pack will help. Swelling and bruising can take several days to resolve..  6. It is common to experience some constipation if taking pain medication after surgery.  Increasing fluid intake and taking a stool softener will usually help or prevent this problem from occurring.  A mild laxative (Milk of Magnesia or Miralax) should be taken according to package directions if there are no bowel movements after 48 hours. 7.  You may have steri-strips (small skin tapes) in place directly over the incision.  These strips should be left on the skin for 7-10 days.  If your  surgeon used skin glue on the incision, you may shower in 24 hours.  The glue will flake off over the next 2-3 weeks.  Any sutures or staples will be removed at the office during your follow-up visit. You may find that a light gauze bandage over your incision may keep your staples from being rubbed or pulled. You may shower and replace the bandage daily. 8. ACTIVITIES:  You may resume regular (light) daily activities beginning the next day--such as daily self-care, walking, climbing stairs--gradually increasing activities as tolerated.  You may have sexual intercourse when it is comfortable.  Refrain from any heavy lifting or straining until approved by your doctor. a. You may drive when you no longer are taking prescription pain medication, you can comfortably wear a seatbelt, and you can safely maneuver your car and apply brakes  9. You should see your doctor in the office for a follow-up appointment approximately two weeks after your surgery.  Make sure that you call for this appointment within a day or two after you arrive home to insure a convenient appointment time.  WHEN TO CALL YOUR DOCTOR: 1. Fever over 101.0 2. Inability to urinate 3. Nausea and/or vomiting 4. Extreme swelling or bruising 5. Continued bleeding from incision. 6. Increased pain, redness, or drainage from the incision. 7. Difficulty swallowing or breathing 8. Muscle cramping or spasms. 9. Numbness or tingling in hands or feet or around lips.  The clinic staff is available to answer your questions during regular business hours.  Please   don't hesitate to call and ask to speak to one of the nurses if you have concerns.  For further questions, please visit [email or web address]    Peptic Ulcer  A peptic ulcer is a painful sore in the lining of your stomach or the first part of your small intestine. What are the causes? Common causes of this condition include:  An infection.  Using certain pain medicines too  often or too much. What increases the risk? You are more likely to get this condition if you:  Smoke.  Have a family history of ulcer disease.  Drink alcohol.  Have been hospitalized in an intensive care unit (ICU). What are the signs or symptoms? Symptoms include:  Burning pain in the area between the chest and the belly button. The pain may: ? Not go away (be persistent). ? Be worse when your stomach is empty. ? Be worse at night.  Heartburn.  Feeling sick to your stomach (nauseous) and throwing up (vomiting).  Bloating. If the ulcer results in bleeding, it can cause you to:  Have poop (stool) that is black and looks like tar.  Throw up bright red blood.  Throw up material that looks like coffee grounds. How is this treated? Treatment for this condition may include:  Stopping things that can cause the ulcer, such as: ? Smoking. ? Using pain medicines.  Medicines to reduce stomach acid.  Antibiotic medicines if the ulcer is caused by an infection.  A procedure that is done using a small, flexible tube that has a camera at the end (upper endoscopy). This may be done if you have a bleeding ulcer.  Surgery. This may be needed if: ? You have a lot of bleeding. ? The ulcer caused a hole somewhere in the digestive system. Follow these instructions at home:  Do not drink alcohol if your doctor tells you not to drink.  Limit how much caffeine you take in.  Do not use any products that contain nicotine or tobacco, such as cigarettes, e-cigarettes, and chewing tobacco. If you need help quitting, ask your doctor.  Take over-the-counter and prescription medicines only as told by your doctor. ? Do not stop or change your medicines unless you talk with your doctor about it first. ? Do not take aspirin, ibuprofen, or other NSAIDs unless your doctor told you to do so.  Keep all follow-up visits as told by your doctor. This is important. Contact a doctor if:  You do not  get better in 7 days after you start treatment.  You keep having an upset stomach (indigestion) or heartburn. Get help right away if:  You have sudden, sharp pain in your belly (abdomen).  You have belly pain that does not go away.  You have bloody poop (stool) or black, tarry poop.  You throw up blood. It may look like coffee grounds.  You feel light-headed or feel like you may pass out (faint).  You get weak.  You get sweaty or feel sticky and cold to the touch (clammy). Summary  Symptoms of a peptic ulcer include burning pain in the area between the chest and the belly button.  Take medicines only as told by your doctor.  Limit how much alcohol and caffeine you have.  Keep all follow-up visits as told by your doctor. This information is not intended to replace advice given to you by your health care provider. Make sure you discuss any questions you have with your health care provider. Document Revised: 09/29/2017 Document  Reviewed: 09/29/2017 Elsevier Patient Education  [employer].

## 2020-01-24 NOTE — Progress Notes (Signed)
Physical Therapy Treatment Patient Details Name: Marcus [name] MRN: [id number] DOB: [birth year] Today's Date: 01/24/2020    History of Present Illness Marcus [name] is an 59 y.o. 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 CT abdomen/pelvis with contrast revealed a perforated viscus with pneumoperitoneum.He was taken to the OR for exploratory laparotomy by Dr. Kieth Brightly on the evening of 10/12 and admitted to the ICU following the surgery. 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 The patient was extubated on 10/13 and presents now on 2 L.    PT Comments    The patient was tested for need for supplemental oxygen. Patient ambulated x 80' using RW On Ra and dropped to 86%, on 2 L SPO2 93%.  Patient cautioned to be vigilant at home and not smoke  With oxygen in home if DC'd with O2 as indicated.  Follow Up Recommendations  No PT follow up (pt declines)     Equipment Recommendations  None recommended by PT    Recommendations for Other Services       Precautions / Restrictions Precautions Precautions: Fall Precaution Comments: JP drain, abdominal incision, monitor sats    Mobility  Bed Mobility               General bed mobility comments: seated on bed edge  Transfers   Equipment used: Rolling walker (2 wheeled) Transfers: Sit to/from Stand Sit to Stand: Supervision            Ambulation/Gait Ambulation/Gait assistance: Supervision Gait Distance (Feet): 80 Feet Assistive device: Rolling walker (2 wheeled) Gait Pattern/deviations: Step-to pattern     General Gait Details: decreased speed,   Stairs             Wheelchair Mobility    Modified Rankin (Stroke Patients Only)       Balance Overall balance  assessment: Mild deficits observed, not formally tested                                          Cognition Arousal/Alertness: Awake/alert Behavior During Therapy: Flat affect                             Safety/Judgement: Decreased awareness of safety     General Comments: pt stated" I don't need oxygen" although sat walk test showed that he desaturates.      Exercises      General Comments        Pertinent Vitals/Pain Pain Assessment: 0-10 Pain Score: 10-Worst pain ever Pain Location: abdomen Pain Descriptors / Indicators: Sore Pain Intervention(s): Premedicated before session;Monitored during session    Home Living                      Prior Function            PT Goals (current goals can now be found in the care plan section) Progress towards PT goals: Progressing toward goals    Frequency    Min 3X/week      PT Plan Current plan remains appropriate    Co-evaluation  AM-PAC PT "6 Clicks" Mobility   Outcome Measure                   End of Session Equipment Utilized During Treatment: Oxygen Activity Tolerance: Patient limited by fatigue Patient left: in bed;with call bell/phone within reach Nurse Communication: Mobility status PT Visit Diagnosis: Unsteadiness on feet (R26.81);Difficulty in walking, not elsewhere classified (R26.2);Pain     Time: 1035-1050 PT Time Calculation (min) (ACUTE ONLY): 15 min  Charges:  $Gait Training: 8-22 mins                     [place] Pager [phone] Office [phone]    Claretha Cooper 01/24/2020, 11:08 AM

## 2020-01-24 NOTE — Progress Notes (Signed)
Occupational Therapy Treatment Patient Details Name: Marcus [name] MRN: [id number] DOB: Sep 04, 1960 Today's Date: 01/24/2020    History of present illness Marcus [name] is an 59 y.o. 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 CT abdomen/pelvis with contrast revealed a perforated viscus with pneumoperitoneum.He was taken to the OR for exploratory laparotomy by Dr. Kieth Brightly on the evening of 10/12 and admitted to the ICU following the surgery. 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 The patient was extubated on 10/13 and presents now on 2 L.   OT comments  Patient progressing and showed improved overall ADL performance today compared to previous session as evidenced by increased score on "6-Clicks" AM-PAC measure of functional ADL performance from a 17/24 to a 19/24. Patient limited by continued dependence on suplimental O2, and decreased awareness of limitations with refusal for home health PT or OT, along with deficits noted below. Pt continues to demonstrate good rehab potential and could benefit from continued skilled OT to increase safety and independence with ADLs and functional transfers to allow pt to return home safely and reduce caregiver burden and fall risk.   Follow Up Recommendations  Home health OT;Other (comment) (However pt is refusing Home health)    Equipment Recommendations  Tub/shower seat    Recommendations for Other Services      Precautions / Restrictions Precautions Precautions: Fall Precaution Comments: JP drain, abdominal incision, monitor sats Restrictions Weight Bearing Restrictions: No       Mobility Bed Mobility Overal bed mobility: Needs Assistance Bed Mobility: Supine to Sit     Supine to sit: HOB  elevated;Supervision     General bed mobility comments: Verbal cues for hand placement.  Transfers Overall transfer level: Needs assistance Equipment used: Rolling walker (2 wheeled) Transfers: Sit to/from Stand Sit to Stand: Supervision Stand pivot transfers: Min guard;Supervision       General transfer comment: no physical assist, cues for hand placement    Balance Overall balance assessment: Mild deficits observed, not formally tested (mild deficits standing. Good sitting)   Sitting balance-Leahy Scale: Good       Standing balance-Leahy Scale: Fair Standing balance comment: reliant on RW                           ADL either performed or assessed with clinical judgement   ADL Overall ADL's : Needs assistance/impaired Eating/Feeding: Independent   Grooming: Wash/dry hands;Standing;Supervision/safety       Lower Body Bathing: Sitting/lateral leans;Sit to/from stand;Min guard   Upper Body Dressing : Set up;Sitting   Lower Body Dressing: Sitting/lateral leans;Sit to/from stand;Min guard;Set up     Toilet Transfer Details (indicate cue type and reason): Pt using urinal. Toileting- Clothing Manipulation and Hygiene: Set up;Bed level Toileting - Clothing Manipulation Details (indicate cue type and reason): setup for urinal per pt preference     Functional mobility during ADLs: Min guard;Supervision/safety;Rolling walker       Vision Patient Visual Report: No change from baseline     Perception     Praxis      Cognition Arousal/Alertness: Awake/alert Behavior During Therapy: Flat affect  Safety/Judgement: Decreased awareness of deficits;Decreased awareness of safety     General Comments: Per PT note: Pt stated" I don't need oxygen" although sat walk test showed that he desaturates.  Pt is also refusing HH OT and PT, however is showing a need for follow up therapy in the home.        Exercises      Shoulder Instructions       General Comments      Pertinent Vitals/ Pain       Pain Assessment: 0-10 Pain Score: 10-Worst pain ever Pain Location: abdomen Pain Descriptors / Indicators: Sore Pain Intervention(s): Patient requesting pain meds-RN notified;Limited activity within patient's tolerance;RN gave pain meds during session;Monitored during session;Repositioned (RN notified and provided pain meds)  Home Living                                          Prior Functioning/Environment              Frequency  Min 2X/week        Progress Toward Goals  OT Goals(current goals can now be found in the care plan section)  Progress towards OT goals: Progressing toward goals  Acute Rehab OT Goals Patient Stated Goal: Go home OT Goal Formulation: With patient Time For Goal Achievement: 02/03/20 Potential to Achieve Goals: Good  Plan Discharge plan remains appropriate    Co-evaluation                 AM-PAC OT "6 Clicks" Daily Activity     Outcome Measure   Help from another person eating meals?: None Help from another person taking care of personal grooming?: A Little Help from another person toileting, which includes using toliet, bedpan, or urinal?: A Little Help from another person bathing (including washing, rinsing, drying)?: A Little Help from another person to put on and taking off regular upper body clothing?: A Little Help from another person to put on and taking off regular lower body clothing?: A Little 6 Click Score: 19    End of Session Equipment Utilized During Treatment: Rolling walker;Oxygen (2L)  OT Visit Diagnosis: Unsteadiness on feet (R26.81);Muscle weakness (generalized) (M62.81);Pain Pain - part of body:  (abd incision)   Activity Tolerance Patient tolerated treatment well;Patient limited by pain   Patient Left in bed;with call bell/phone within reach;with bed alarm set   Nurse Communication Mobility status         Time: 2080-2233 OT Time Calculation (min): 12 min  Charges: OT General Charges $OT Visit: 1 Visit OT Treatments $Self Care/Home Management : 8-22 mins  Anderson Malta, [place] Office: [phone] 01/24/2020   Julien Girt 01/24/2020, 11:35 AM

## 2020-01-24 NOTE — Progress Notes (Signed)
Progress Note: General Surgery Service   Chief Complaint/Subjective: Pain moderate, tolerating diet, ambulated in room yesterday  Objective: Vital signs in last 24 hours: Temp:  [97.8 F (36.6 C)-98 F (36.7 C)] 97.8 F (36.6 C) (10/19 0440) Pulse Rate:  [77-79] 79 (10/19 0440) Resp:  [18-20] 18 (10/19 0440) BP: (108-130)/(67-83) 130/83 (10/19 0440) SpO2:  [83 %-94 %] 83 % (10/19 0836) Weight:  [66 kg] 66 kg (10/19 0440) Last BM Date: 01/22/20  Intake/Output from previous day: 10/18 0701 - 10/19 0700 In: 480 [P.O.:480] Out: 1760 [Urine:1750; Drains:10] Intake/Output this shift: Total I/O In: -  Out: 30 [Drains:30]  Gen: NAD  Resp: somewhat labored, on room air  Card: RRR  Abd: soft, incision c/d/i, drain with serosanguinous output  Lab Results: CBC  Recent Labs    01/22/20 0016  WBC 6.7  HGB 16.6  HCT 50.6  PLT 335   BMET Recent Labs    01/22/20 0016 01/23/20 0453  NA 132* 133*  K 3.9 3.9  CL 97* 100  CO2 26 22  GLUCOSE 130* 93  BUN 10 9  CREATININE 0.60* 0.52*  CALCIUM 8.0* 7.8*   PT/INR No results for input(s): LABPROT, INR in the last 72 hours. ABG No results for input(s): PHART, HCO3 in the last 72 hours.  Invalid input(s): PCO2, PO2  Anti-infectives: Anti-infectives (From admission, onward)   Start     Dose/Rate Route Frequency Ordered Stop   01/18/20 0800  vancomycin (VANCOREADY) IVPB 750 mg/150 mL  Status:  Discontinued        750 mg 150 mL/hr over 60 Minutes Intravenous Every 12 hours 01/17/20 1833 01/18/20 1004   01/18/20 0030  piperacillin-tazobactam (ZOSYN) IVPB 3.375 g  Status:  Discontinued        3.375 g 12.5 mL/hr over 240 Minutes Intravenous Every 8 hours 01/17/20 2350 01/23/20 1057   01/17/20 2330  piperacillin-tazobactam (ZOSYN) IVPB 3.375 g  Status:  Discontinued        3.375 g 100 mL/hr over 30 Minutes Intravenous Every 8 hours 01/17/20 2324 01/17/20 2348   01/17/20 1845  vancomycin (VANCOREADY) IVPB 1500 mg/300 mL         1,500 mg 150 mL/hr over 120 Minutes Intravenous  Once 01/17/20 1820 01/18/20 0130   01/17/20 1800  piperacillin-tazobactam (ZOSYN) IVPB 3.375 g        3.375 g 100 mL/hr over 30 Minutes Intravenous  Once 01/17/20 1800 01/17/20 1858      Medications: Scheduled Meds: . acetaminophen  650 mg Oral Q6H  . enoxaparin (LOVENOX) injection  40 mg Subcutaneous Q24H  . ipratropium-albuterol  3 mL Nebulization Q6H WA  . lidocaine  1 patch Transdermal Q24H  . mouth rinse  15 mL Mouth Rinse BID  . methocarbamol  1,000 mg Oral Q8H  . mometasone-formoterol  1 puff Inhalation BID  . nicotine  21 mg Transdermal Daily  . nystatin  5 mL Oral QID  . pantoprazole  40 mg Oral BID   Continuous Infusions: PRN Meds:.hydrALAZINE, morphine injection, ondansetron (ZOFRAN) IV, oxyCODONE  Assessment/Plan: s/p Procedure(s): EXPLORATORY LAPAROTOMY HERNIA REPAIR INCISIONAL AND GRAHAM PATCH 01/17/2020 -pain control -remove drain -hopefully home today -f/u in 2 weeks   LOS: 7 days   Mickeal Skinner, MD [facility]

## 2020-01-24 NOTE — Progress Notes (Signed)
SATURATION QUALIFICATIONS: (This note is used to comply with regulatory documentation for home oxygen)  Patient Saturations on Room Air at Rest = 93%  Patient Saturations on Room Air while Ambulating = 86%  Patient Saturations on 2 Liters of oxygen while Ambulating = 92%  Please briefly explain why patient needs home oxygen:to maintain saturation  While ambulating. [place] Pager [phone] Office [phone]

## 2020-01-24 NOTE — Progress Notes (Addendum)
PT Sp02 83% on room air. Placed PT on 2 lpm post neb treatment. RN aware.

## 2020-01-24 NOTE — Progress Notes (Signed)
SATURATION QUALIFICATIONS: (This note is used to comply with regulatory documentation for home oxygen)  Patient Saturations on Room Air at Rest = 88%  Patient Saturations on Room Air while Ambulating = 86%  Patient Saturations on 2 Liters of oxygen while Ambulating = 93%  Please briefly explain why patient needs home oxygen: 

## 2020-02-03 NOTE — Discharge Summary (Signed)
Physician Discharge Summary  Patient ID: GURMAN ASHLAND MRN: [id number] DOB/AGE: 11-08-1960 59 y.o.  Admit date: 01/17/2020 Discharge date: 02/03/2020  Admission Diagnoses:  Discharge Diagnoses:  Principal Problem:   Perforated prepyloric ulcer 01/18/2020 Active Problems:   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HCC)   Chronic obstructive pulmonary disease (HCC)   S/P splenectomy   COPD with acute exacerbation (HCC)   Pneumoperitoneum   Severe sepsis with septic shock (HCC)   Acute hypoxemic respiratory failure (HCC)   Hypokalemia   Discharged Condition: good  Hospital Course: 59 yo male presented tot eh ED with abdominal pain and findings of free air under the diaphragm. He was taken emergently to the operating room and underwent graham's patch repair of perforated ulcer. Post op he was admitted to the surgical floor. He underwent swallow study at 59 h which was negative for leak and slowly advanced on diet. He worked with PT for ambulation. He had some pain issues and ileus. He slowly improved and was discharged home.  Consults: None  Significant Diagnostic Studies: labs:  CBC    Component Value Date/Time   WBC 6.7 01/22/2020 0016   RBC 5.82 (H) 01/22/2020 0016   HGB 16.6 01/22/2020 0016   HCT 50.6 01/22/2020 0016   PLT 335 01/22/2020 0016   MCV 86.9 01/22/2020 0016   MCH 28.5 01/22/2020 0016   MCHC 32.8 01/22/2020 0016   RDW 16.7 (H) 01/22/2020 0016   LYMPHSABS 0.5 (L) 01/19/2020 0530   MONOABS 1.1 (H) 01/19/2020 0530   EOSABS 0.0 01/19/2020 0530   BASOSABS 0.0 01/19/2020 0530     Treatments: IV hydration and surgery: graham's patch  Discharge Exam: Blood pressure 106/68, pulse 66, temperature 97.8 F (36.6 C), temperature source Oral, resp. rate 18, height 5\' 10"  (1.778 m), weight 66 kg, SpO2 93 %. General appearance: alert and cooperative Head: Normocephalic, without obvious abnormality, atraumatic GI: soft, non-tender; bowel sounds normal; no masses,  no organomegaly, incision  c/d/i  Disposition: Discharge disposition: 01-Home or Self Care       Discharge Instructions    Call MD for:  persistant nausea and vomiting   Complete by: As directed    Call MD for:  redness, tenderness, or signs of infection (pain, swelling, redness, odor or green/yellow discharge around incision site)   Complete by: As directed    Call MD for:  severe uncontrolled pain   Complete by: As directed    Call MD for:  temperature >100.4   Complete by: As directed    Diet - low sodium heart healthy   Complete by: As directed    Discharge wound care:   Complete by: As directed    No dressing needed. Can place dry bandage if wound has leakage   Increase activity slowly   Complete by: As directed      Allergies as of 01/24/2020      Reactions   Levaquin [levofloxacin In D5w] Other (See Comments)   Chest pain   Methotrexate Other (See Comments)   Chest pain      Medication List    STOP taking these medications   azithromycin 250 MG tablet Commonly known as: Zithromax Z-Pak   predniSONE 10 MG tablet Commonly known as: DELTASONE     TAKE these medications   acetaminophen 325 MG tablet Commonly known as: TYLENOL Take 2 tablets (650 mg total) by mouth every 6 (six) hours. Notes to patient: Do not take more than 4000 mg total daily dose of Tylenol from all meds  that contain Tylenol    albuterol (2.5 MG/3ML) 0.083% nebulizer solution Commonly known as: PROVENTIL Take 3 mLs (2.5 mg total) by nebulization every 6 (six) hours as needed for wheezing or shortness of breath. What changed: Another medication with the same name was removed. Continue taking this medication, and follow the directions you see here.   methocarbamol 500 MG tablet Commonly known as: ROBAXIN Take 2 tablets (1,000 mg total) by mouth every 8 (eight) hours. Notes to patient: Treats muscle pain and spasms May make you dizzy or drowsy. Do not drive or do anything that could be dangerous until you know how  this medicine affects you.   mometasone-formoterol 200-5 MCG/ACT Aero Commonly known as: DULERA Inhale 1 puff into the lungs 2 (two) times daily.   oxyCODONE 5 MG immediate release tablet Commonly known as: Oxy IR/ROXICODONE Take 1-2 tablets (5-10 mg total) by mouth every 4 (four) hours as needed for moderate pain or severe pain. Notes to patient: May cause constipation This medicine may make you dizzy or drowsy. Do not drive or do anything that could be dangerous until you know how this medicine affects you.   phenol 1.4 % Liqd Commonly known as: CHLORASEPTIC Use as directed 1 spray in the mouth or throat as needed for throat irritation / pain.            Discharge Care Instructions  (From admission, onward)         Start     Ordered   01/24/20 0000  Discharge wound care:       Comments: No dressing needed. Can place dry bandage if wound has leakage   01/24/20 0948          Follow-up Information    [facility] [place]. Go on 01/30/2020.   Specialty: General Surgery Why: Your appointment is 10/26 at 10am to have your staples removed Please arrive 30 minutes prior to your appointment to check in and fill out paperwork. Bring photo ID and insurance information. Contact information: [address] [place] [address] [phone]        Angell Pincock, Arta Bruce, MD. Go on 02/15/2020.   Specialty: General Surgery Why: Your appointment is 02/15/20 at 10am to follow up with your surgeon. Please arrive 15 minutes prior to your appointment to check in. Contact information: [address] [place] [address] [phone]        Pamella Pert, MD. Call.   Specialty: Internal Medicine Why: call to arrange post-hospitalization follow up appointment with your primary care physician, and to manage your home oxygen Contact information: [place] [address] [phone]        [facility]  Follow up.   Why: agency will provide home oxygen. Contact information: [address] [place] [address] [phone]               Signed: Arta Bruce Ramisa Duman 58/29/2021, 2:31 PM

## 2020-06-10 ENCOUNTER — Inpatient Hospital Stay (HOSPITAL_COMMUNITY)
Admission: EM | Admit: 2020-06-10 | Discharge: 2020-06-12 | DRG: 190 | Disposition: A | Discharge status: Home / Self Care | Payer: Medicaid Other | Attending: Internal Medicine | Admitting: Internal Medicine

## 2020-06-10 ENCOUNTER — Emergency Department (HOSPITAL_COMMUNITY): Payer: Medicaid Other

## 2020-06-10 ENCOUNTER — Encounter (HOSPITAL_COMMUNITY): Payer: Self-pay | Admitting: Emergency Medicine

## 2020-06-10 ENCOUNTER — Other Ambulatory Visit: Payer: Self-pay

## 2020-06-10 DIAGNOSIS — J449 Chronic obstructive pulmonary disease, unspecified: Secondary | ICD-10-CM

## 2020-06-10 DIAGNOSIS — I5032 Chronic diastolic (congestive) heart failure: Secondary | ICD-10-CM | POA: Diagnosis present

## 2020-06-10 DIAGNOSIS — R079 Chest pain, unspecified: Secondary | ICD-10-CM | POA: Diagnosis present

## 2020-06-10 DIAGNOSIS — F17210 Nicotine dependence, cigarettes, uncomplicated: Secondary | ICD-10-CM | POA: Diagnosis present

## 2020-06-10 DIAGNOSIS — J9601 Acute respiratory failure with hypoxia: Secondary | ICD-10-CM | POA: Diagnosis present

## 2020-06-10 DIAGNOSIS — Z20822 Contact with and (suspected) exposure to covid-19: Secondary | ICD-10-CM | POA: Diagnosis present

## 2020-06-10 DIAGNOSIS — Z9081 Acquired absence of spleen: Secondary | ICD-10-CM

## 2020-06-10 DIAGNOSIS — E871 Hypo-osmolality and hyponatremia: Secondary | ICD-10-CM | POA: Diagnosis present

## 2020-06-10 DIAGNOSIS — L0390 Cellulitis, unspecified: Secondary | ICD-10-CM | POA: Diagnosis present

## 2020-06-10 DIAGNOSIS — Z888 Allergy status to other drugs, medicaments and biological substances status: Secondary | ICD-10-CM

## 2020-06-10 DIAGNOSIS — L03115 Cellulitis of right lower limb: Secondary | ICD-10-CM | POA: Diagnosis present

## 2020-06-10 DIAGNOSIS — E861 Hypovolemia: Secondary | ICD-10-CM | POA: Diagnosis present

## 2020-06-10 DIAGNOSIS — Z825 Family history of asthma and other chronic lower respiratory diseases: Secondary | ICD-10-CM

## 2020-06-10 DIAGNOSIS — D61818 Other pancytopenia: Secondary | ICD-10-CM | POA: Diagnosis present

## 2020-06-10 DIAGNOSIS — K439 Ventral hernia without obstruction or gangrene: Secondary | ICD-10-CM | POA: Diagnosis present

## 2020-06-10 DIAGNOSIS — I2781 Cor pulmonale (chronic): Secondary | ICD-10-CM | POA: Diagnosis present

## 2020-06-10 DIAGNOSIS — L03119 Cellulitis of unspecified part of limb: Secondary | ICD-10-CM

## 2020-06-10 DIAGNOSIS — J441 Chronic obstructive pulmonary disease with (acute) exacerbation: Principal | ICD-10-CM | POA: Diagnosis present

## 2020-06-10 DIAGNOSIS — Z881 Allergy status to other antibiotic agents status: Secondary | ICD-10-CM

## 2020-06-10 DIAGNOSIS — Z79899 Other long term (current) drug therapy: Secondary | ICD-10-CM

## 2020-06-10 DIAGNOSIS — M0500 Felty's syndrome, unspecified site: Secondary | ICD-10-CM | POA: Diagnosis present

## 2020-06-10 LAB — RESP PANEL BY RT-PCR (FLU A&B, COVID) ARPGX2
Influenza A by PCR: NEGATIVE
Influenza B by PCR: NEGATIVE
SARS Coronavirus 2 by RT PCR: NEGATIVE

## 2020-06-10 LAB — CBC
HCT: 50.3 % (ref 39.0–52.0)
Hemoglobin: 15.8 g/dL (ref 13.0–17.0)
MCH: 27.4 pg (ref 26.0–34.0)
MCHC: 31.4 g/dL (ref 30.0–36.0)
MCV: 87.2 fL (ref 80.0–100.0)
Platelets: 149 10*3/uL — ABNORMAL LOW (ref 150–400)
RBC: 5.77 MIL/uL (ref 4.22–5.81)
RDW: 18.7 % — ABNORMAL HIGH (ref 11.5–15.5)
WBC: 3.9 10*3/uL — ABNORMAL LOW (ref 4.0–10.5)
nRBC: 3.6 % — ABNORMAL HIGH (ref 0.0–0.2)

## 2020-06-10 LAB — BASIC METABOLIC PANEL
Anion gap: 8 (ref 5–15)
BUN: 14 mg/dL (ref 6–20)
CO2: 27 mmol/L (ref 22–32)
Calcium: 8.3 mg/dL — ABNORMAL LOW (ref 8.9–10.3)
Chloride: 99 mmol/L (ref 98–111)
Creatinine, Ser: 0.63 mg/dL (ref 0.61–1.24)
GFR, Estimated: >60 mL/min (ref >60)
Glucose, Bld: 82 mg/dL (ref 70–99)
Potassium: 3.7 mmol/L (ref 3.5–5.1)
Sodium: 134 mmol/L — ABNORMAL LOW (ref 135–145)

## 2020-06-10 LAB — HEPATIC FUNCTION PANEL
ALT: 16 U/L (ref 0–44)
AST: 39 U/L (ref 15–41)
Albumin: 2.8 g/dL — ABNORMAL LOW (ref 3.5–5.0)
Alkaline Phosphatase: 119 U/L (ref 38–126)
Bilirubin, Direct: 0.2 mg/dL (ref 0.0–0.2)
Indirect Bilirubin: 0.5 mg/dL (ref 0.3–0.9)
Total Bilirubin: 0.7 mg/dL (ref 0.3–1.2)
Total Protein: 8.1 g/dL (ref 6.5–8.1)

## 2020-06-10 LAB — BLOOD GAS, VENOUS
Acid-Base Excess: 2.5 mmol/L — ABNORMAL HIGH (ref 0.0–2.0)
Bicarbonate: 28 mmol/L (ref 20.0–28.0)
FIO2: 21
O2 Saturation: 68.8 %
Patient temperature: 98.6
pCO2, Ven: 48.8 mmHg (ref 44.0–60.0)
pH, Ven: 7.377 (ref 7.250–7.430)
pO2, Ven: 38.1 mmHg (ref 32.0–45.0)

## 2020-06-10 LAB — TROPONIN I (HIGH SENSITIVITY)
Troponin I (High Sensitivity): 10 ng/L (ref <18)
Troponin I (High Sensitivity): 8 ng/L (ref <18)

## 2020-06-10 LAB — LACTIC ACID, PLASMA
Lactic Acid, Venous: 1.5 mmol/L (ref 0.5–1.9)
Lactic Acid, Venous: 1.4 mmol/L (ref 0.5–1.9)

## 2020-06-10 LAB — LIPASE, BLOOD: Lipase: 67 U/L — ABNORMAL HIGH (ref 11–51)

## 2020-06-10 IMAGING — CR DG CHEST 2V
2 series · 2 of 2 positions shown · non-contrast
Comparison: [DATE]

CLINICAL DATA: Chest pain.

EXAM:
CHEST - 2 VIEW

[w chest pa]
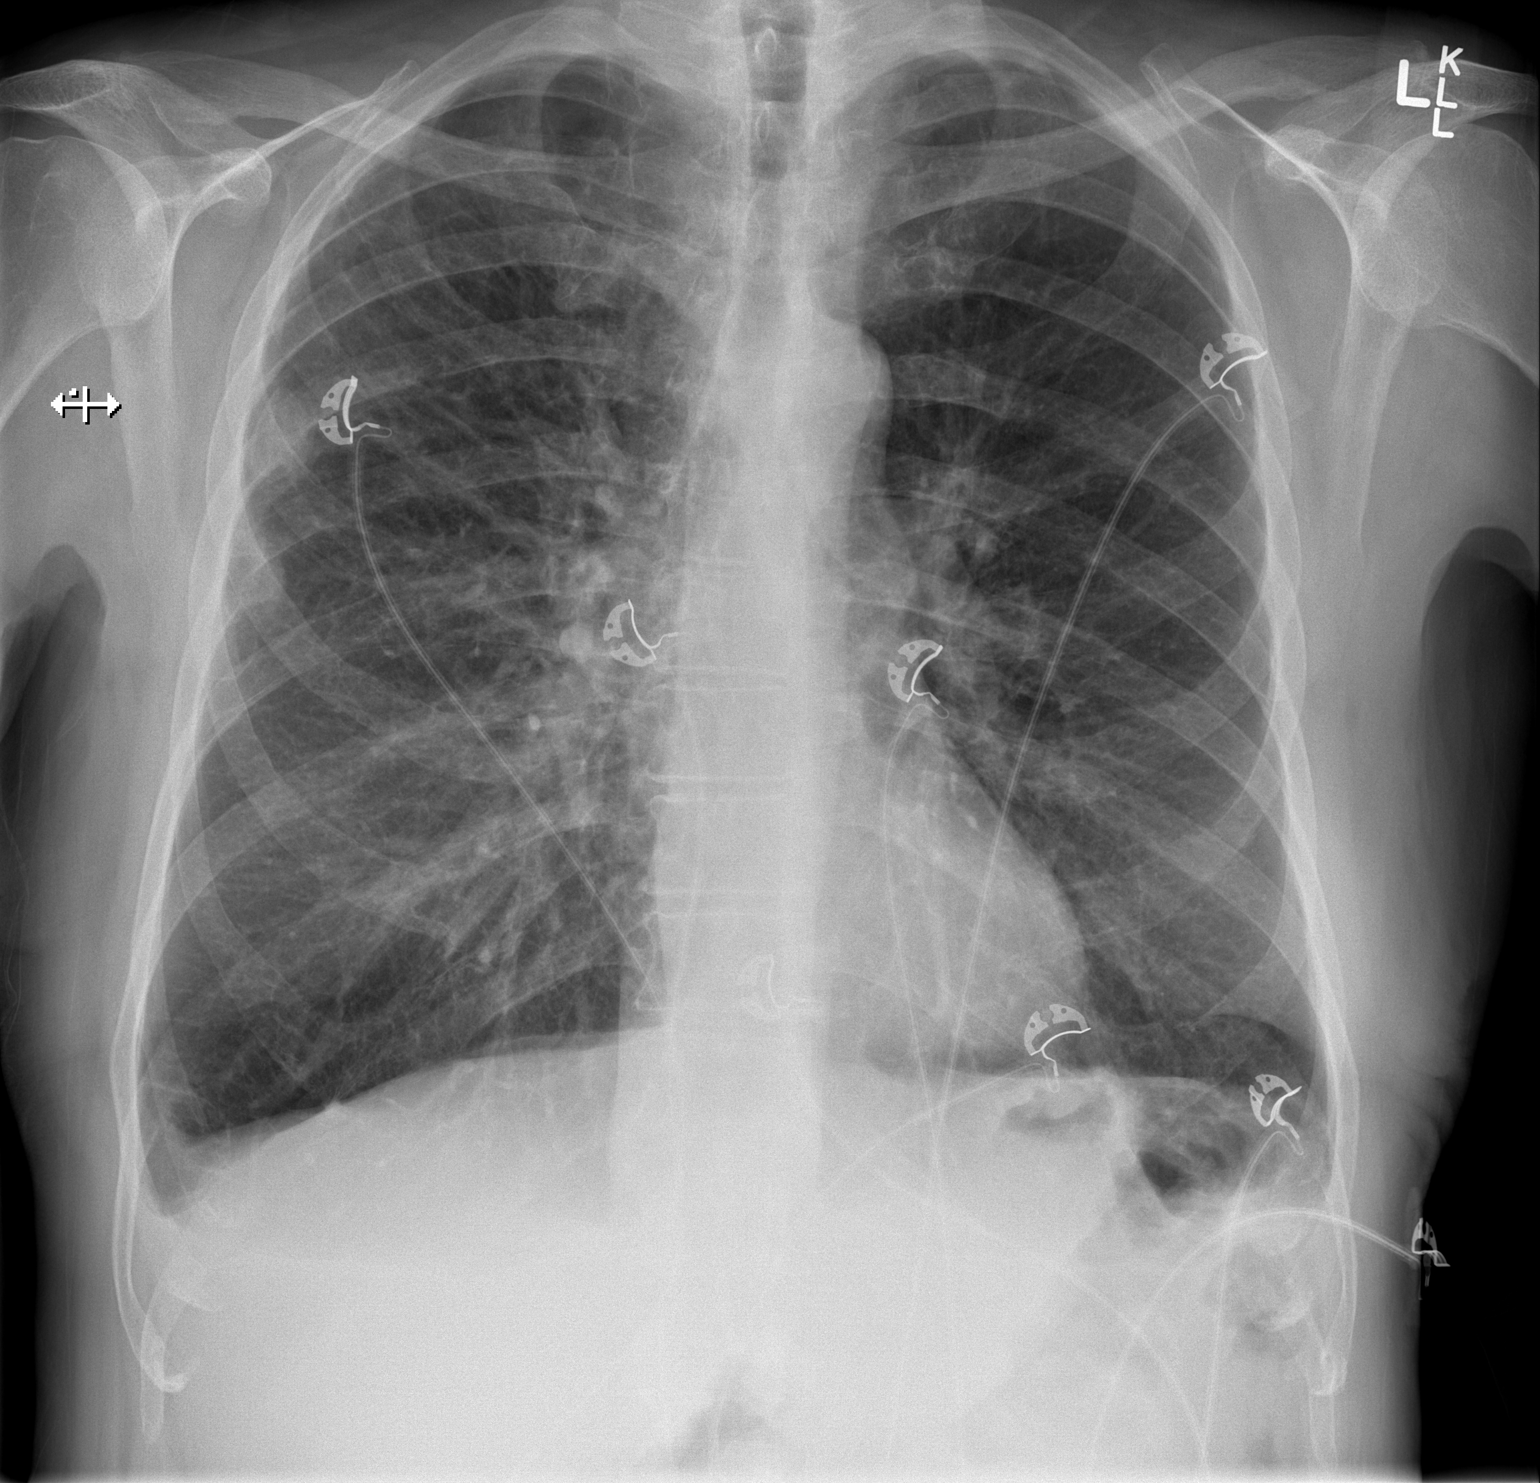

[w chest lat]
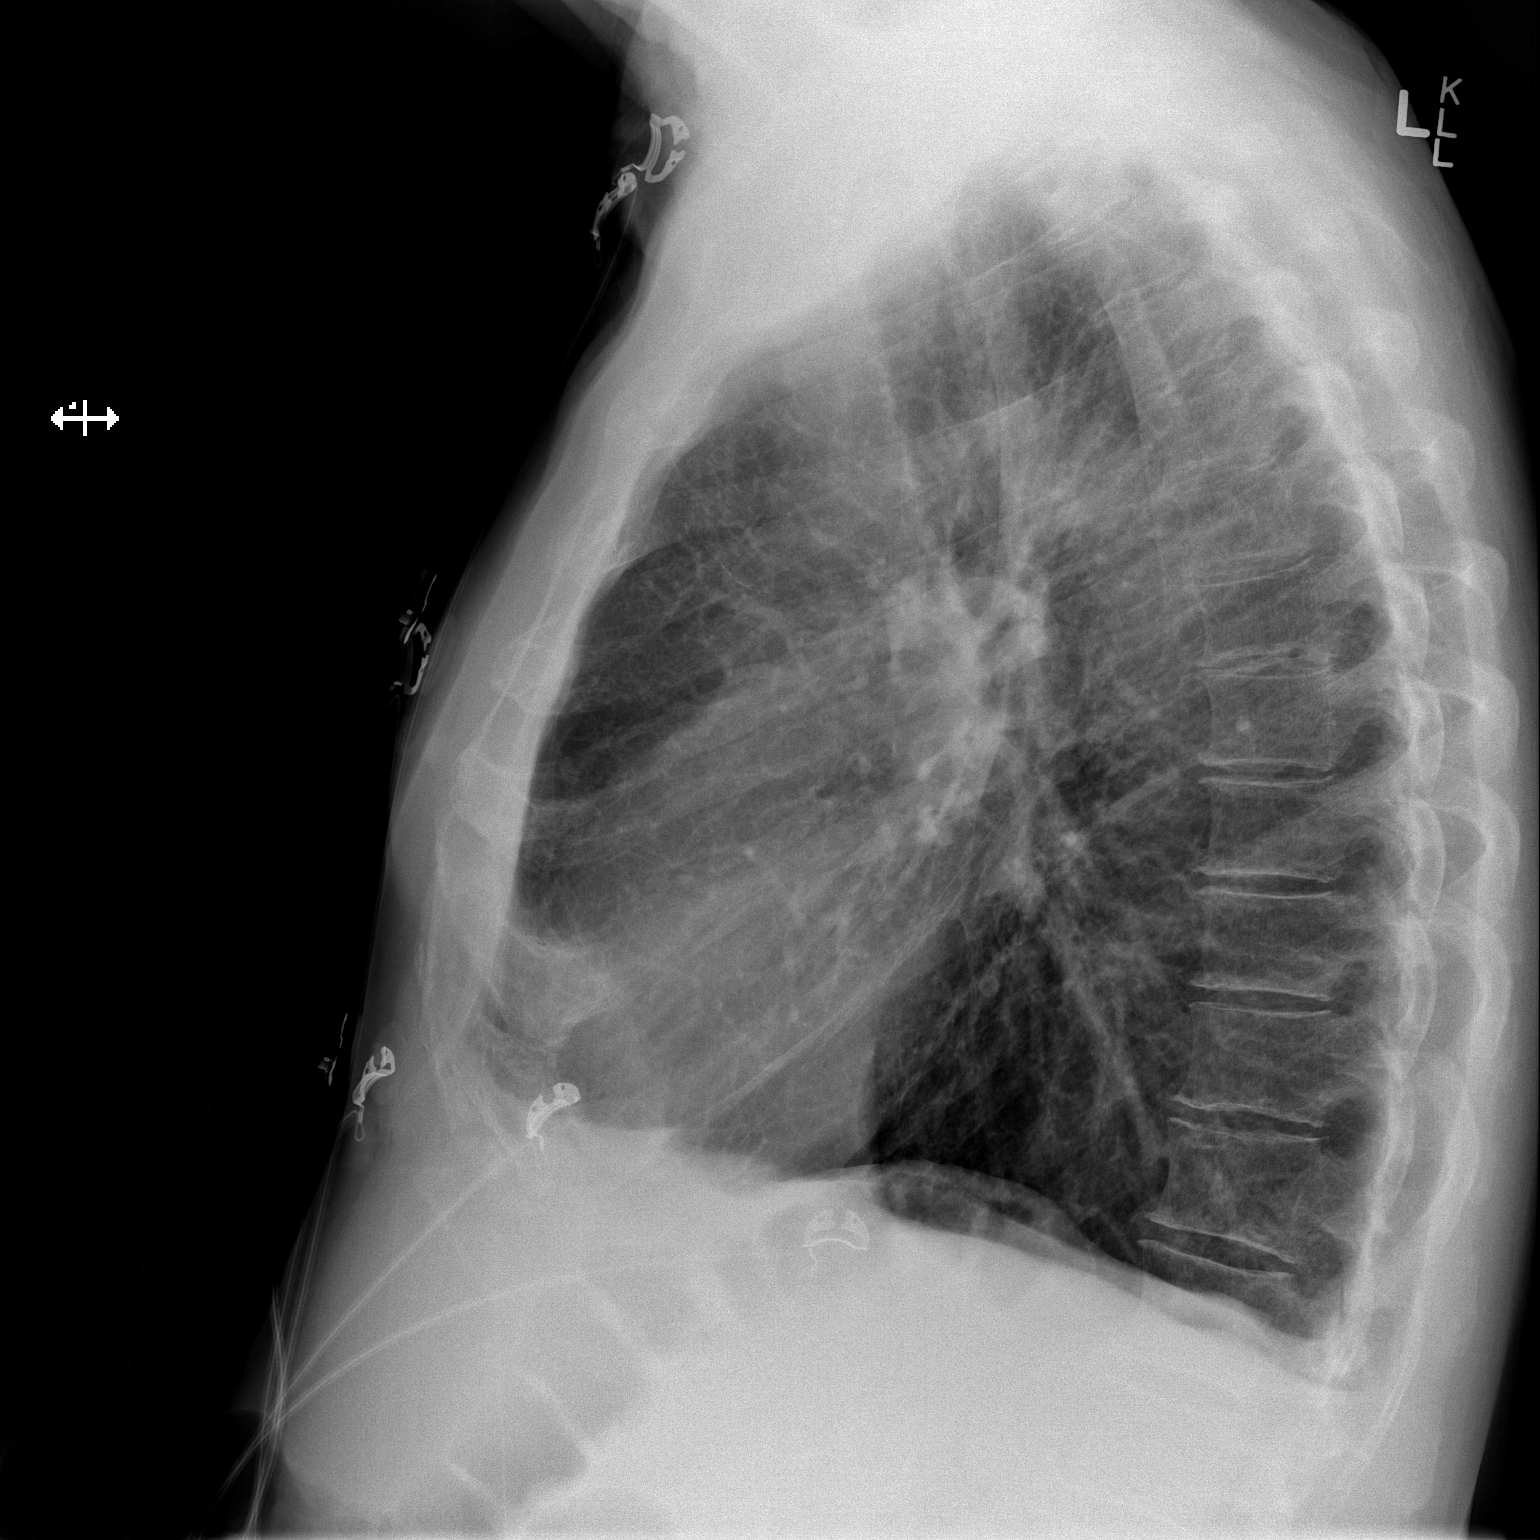

[2 of 2 positions shown; findings below may reference images not displayed]

FINDINGS: There is no evidence of an acute infiltrate. A very small right
pleural effusion is seen. No pneumothorax is identified. The heart
size and mediastinal contours are within normal limits. Chronic
sixth and seventh right rib fractures are seen.
IMPRESSION: Very small right pleural effusion without evidence of acute
infiltrate.

## 2020-06-10 IMAGING — DX DG ABD PORTABLE 1V
2 series · 2 of 2 positions shown · non-contrast
Comparison: [DATE]

CLINICAL DATA: Abdominal pain x5 months.

EXAM:
PORTABLE ABDOMEN - 1 VIEW

[abdomen kub (1 of 2)]
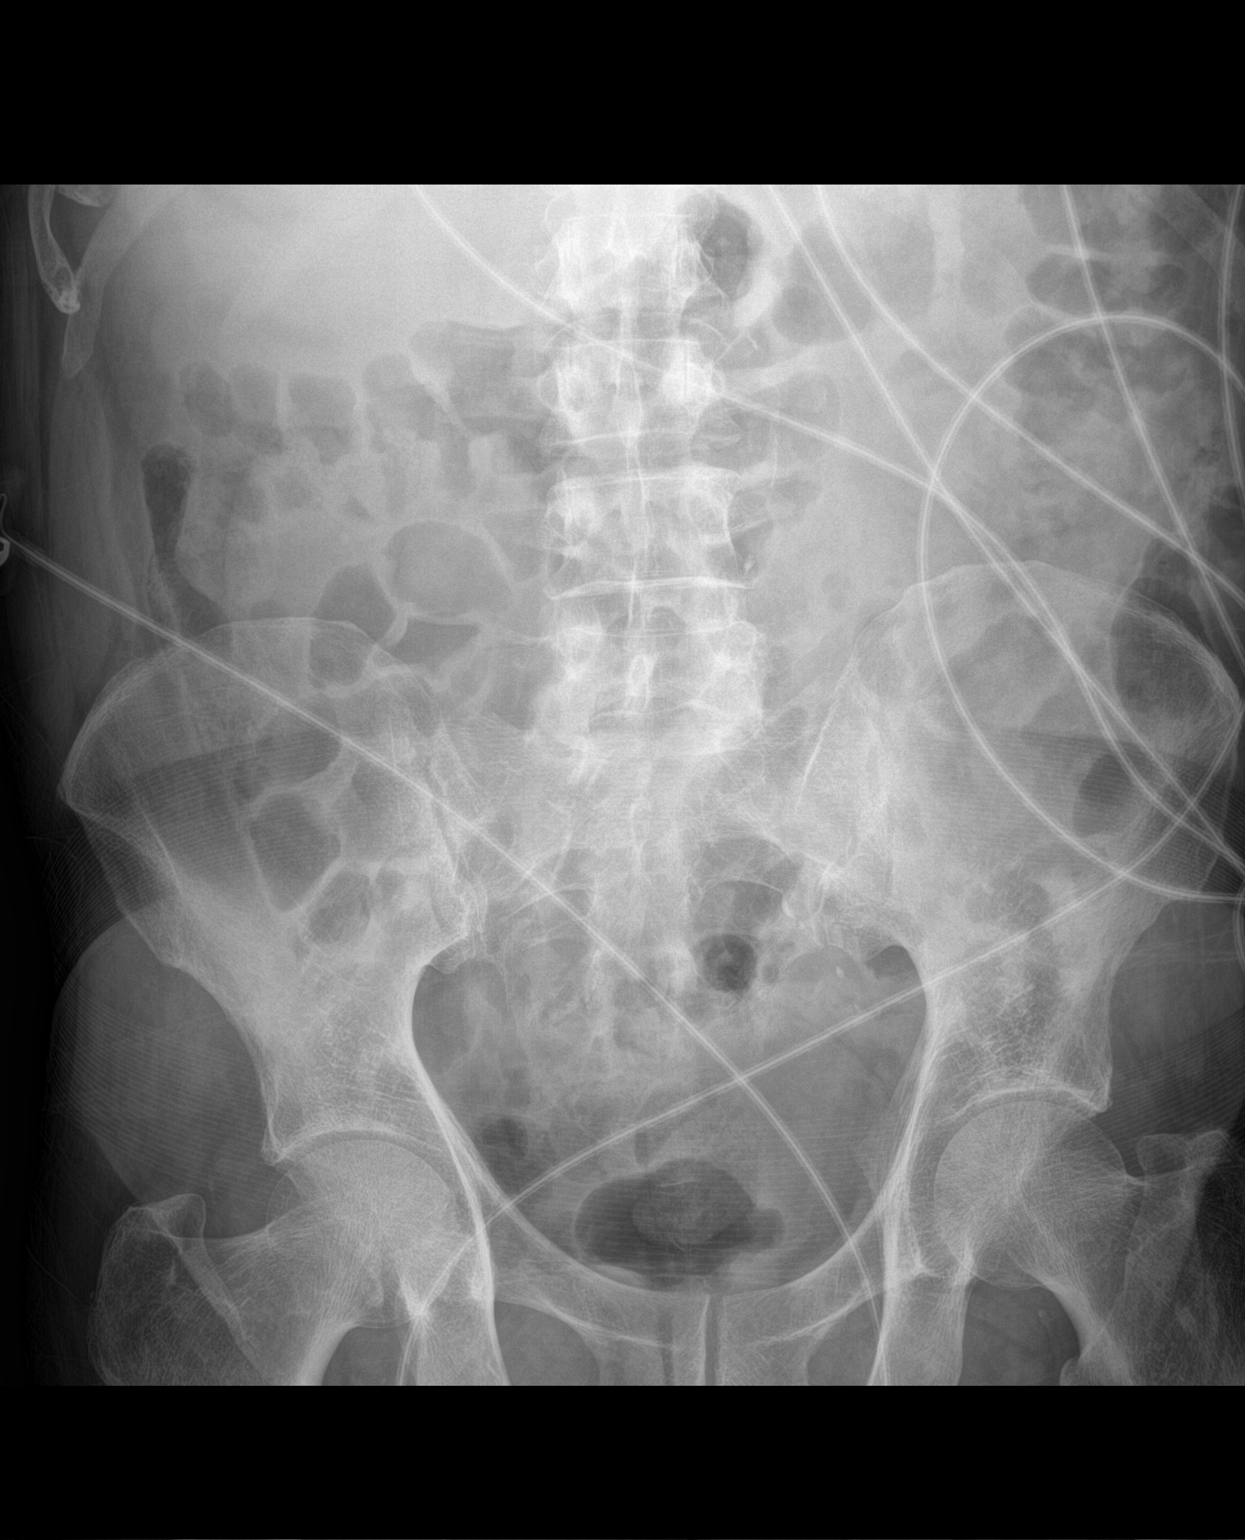

[abdomen kub (2 of 2)]
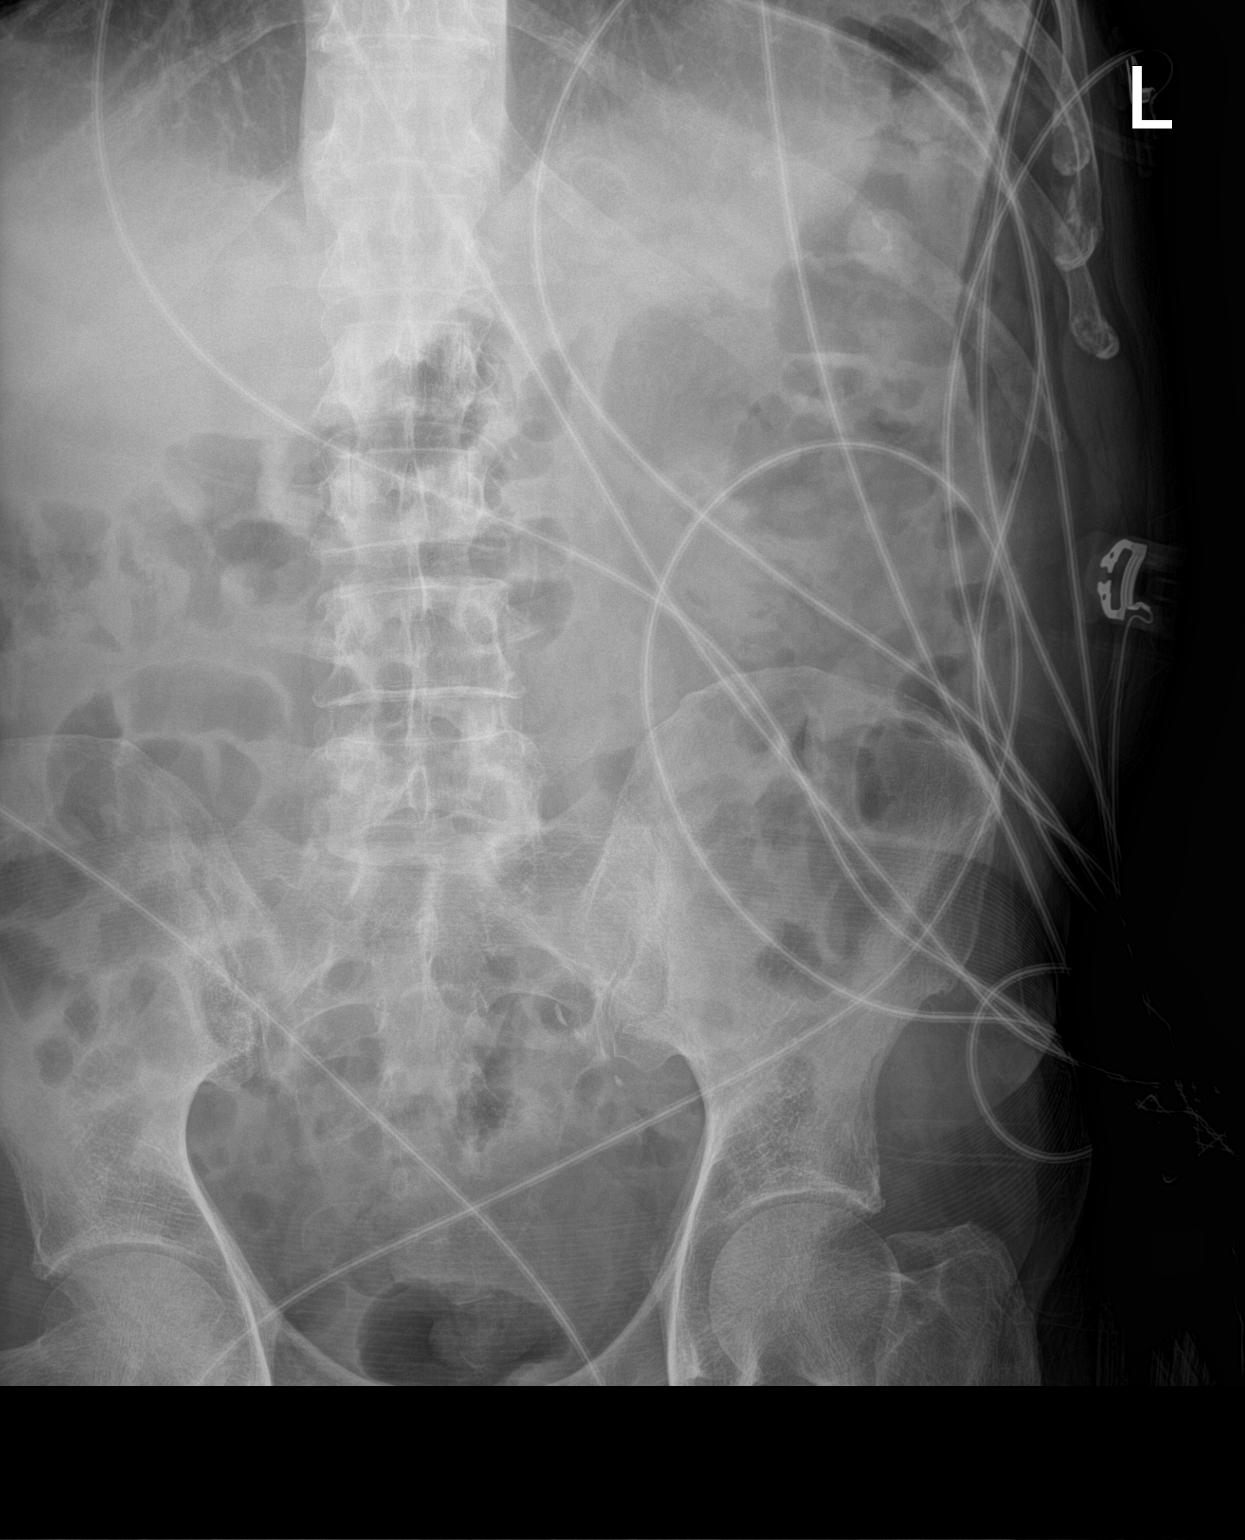

[2 of 2 positions shown; findings below may reference images not displayed]

FINDINGS: The bowel gas pattern is normal. No radio-opaque calculi or other
significant radiographic abnormality are seen.
IMPRESSION: Negative.

## 2020-06-10 IMAGING — CT CT ANGIO CHEST
2 of 6 series · 17 of 46 positions shown · IV contrast (omnipaque)
Comparison: [DATE]

CLINICAL DATA: Chest and abdominal pain

EXAM:
CT ANGIOGRAPHY CHEST
CT ABDOMEN AND PELVIS WITH CONTRAST
TECHNIQUE: Multidetector CT imaging of the chest was performed using the
standard protocol during bolus administration of intravenous
contrast. Multiplanar CT image reconstructions and MIPs were
obtained to evaluate the vascular anatomy. Multidetector CT imaging
of the abdomen and pelvis was performed using the standard protocol
during bolus administration of intravenous contrast.
CONTRAST:  100mL OMNIPAQUE IOHEXOL 350 MG/ML SOLN

[Series 3: thins · axial · 0.77mm/px · z∈[-325,-20]mm · 14 of 335 slices shown]
[im 15/335  lung]
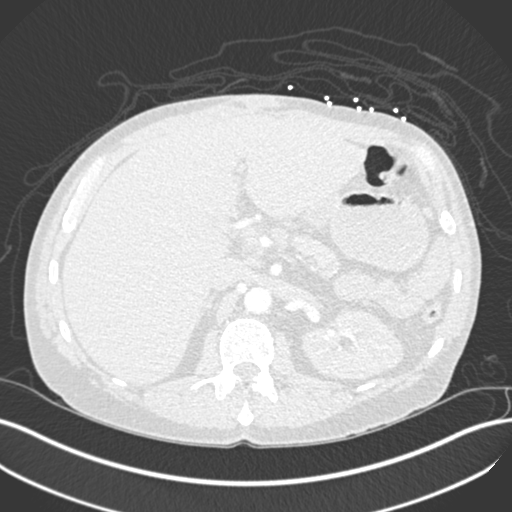
[im 44/335  soft-tissue]
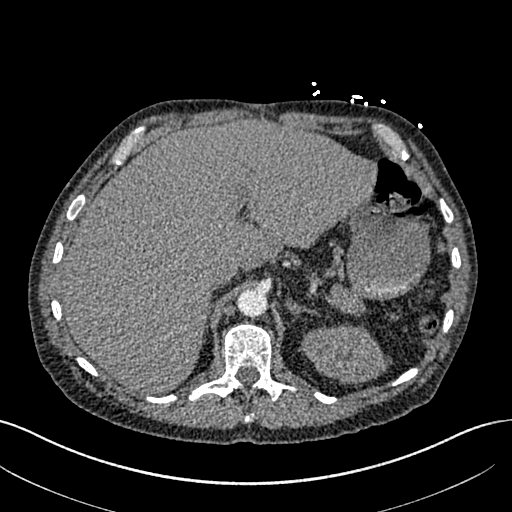
[im 59/335  lung]
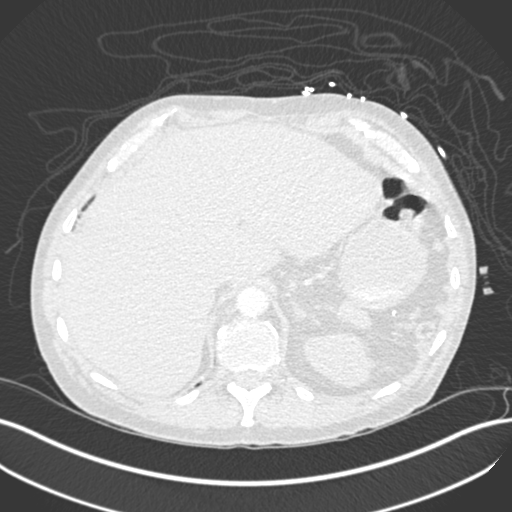
[im 88/335  soft-tissue]
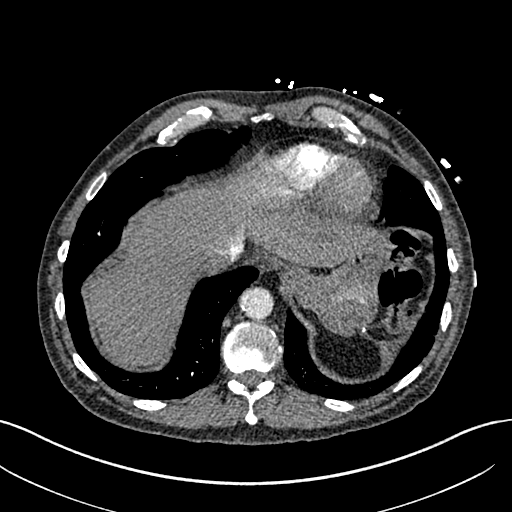
[im 117/335  lung]
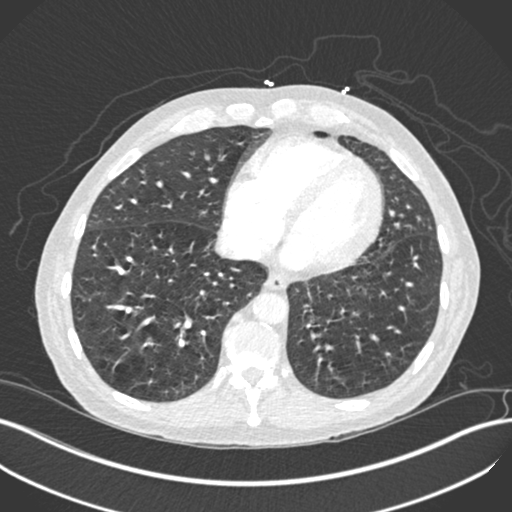
[im 131/335  soft-tissue]
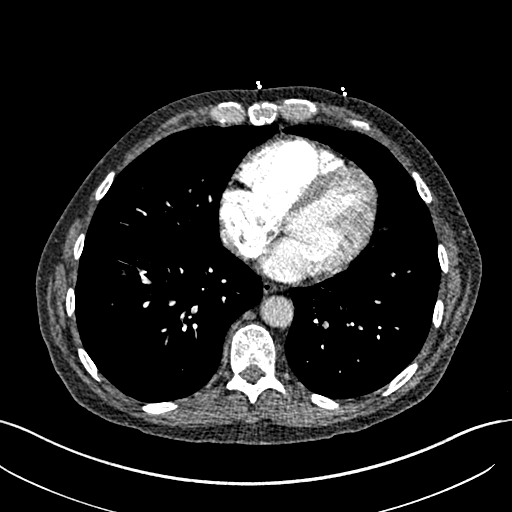
[im 160/335  lung]
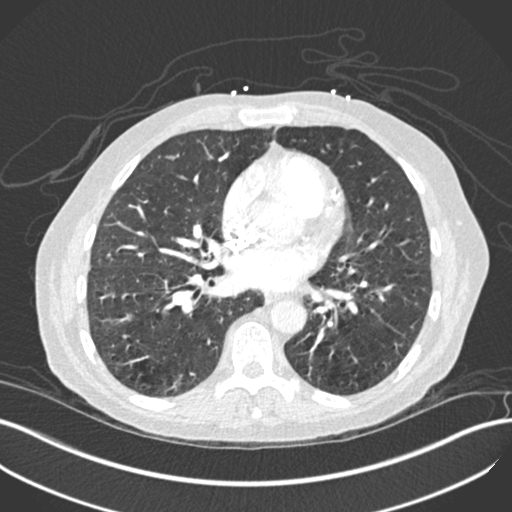
[im 175/335  soft-tissue]
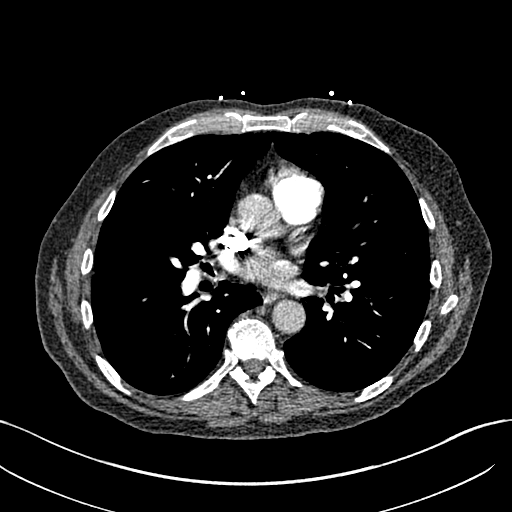
[im 204/335  lung]
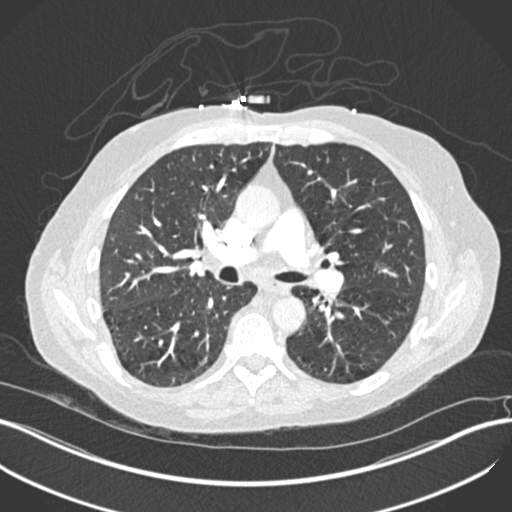
[im 218/335  soft-tissue]
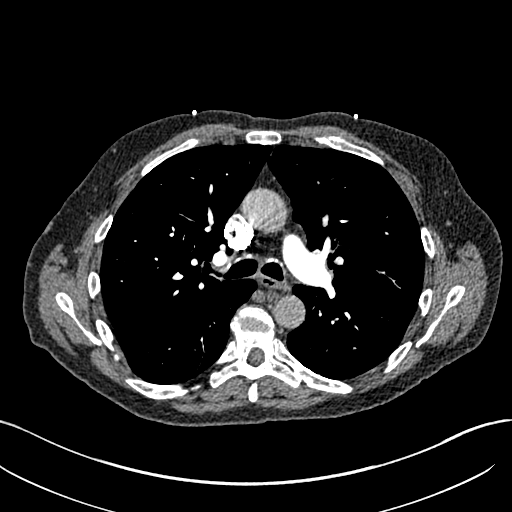
[im 247/335  lung]
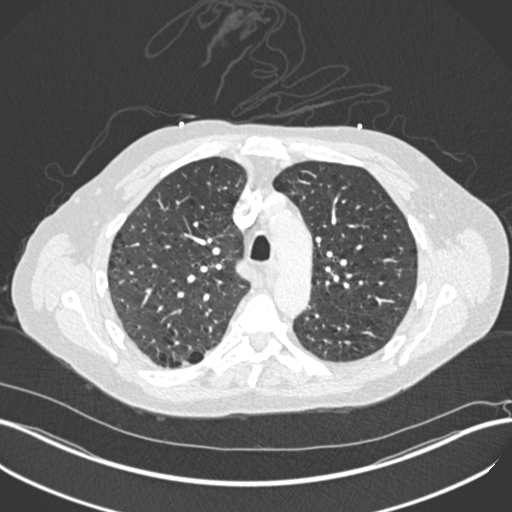
[im 276/335  soft-tissue]
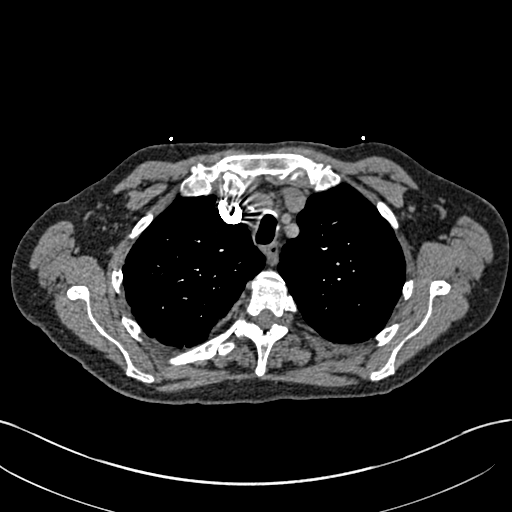
[im 291/335  lung]
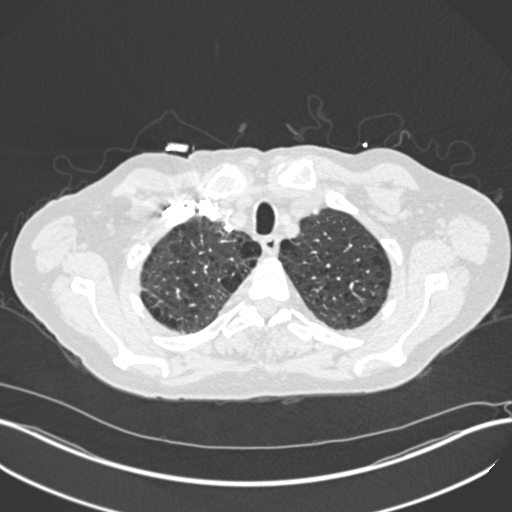
[im 320/335  soft-tissue]
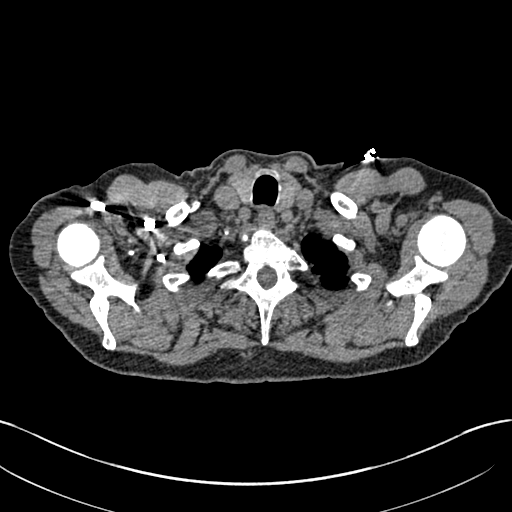

[Series 5: coronal mpr · coronal · 0.71mm/px · 3 of 151 slices shown]
[im 38/151  soft-tissue]
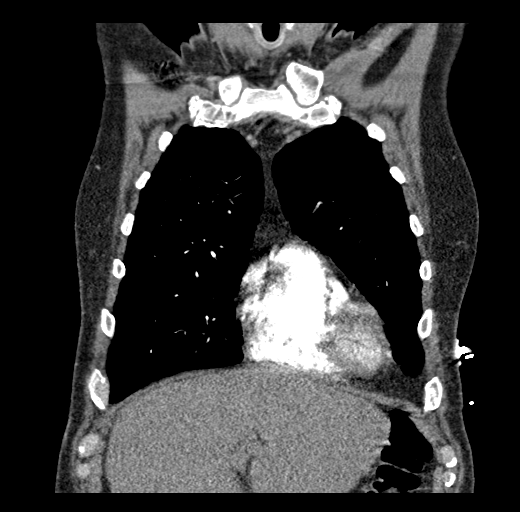
[im 76/151  soft-tissue]
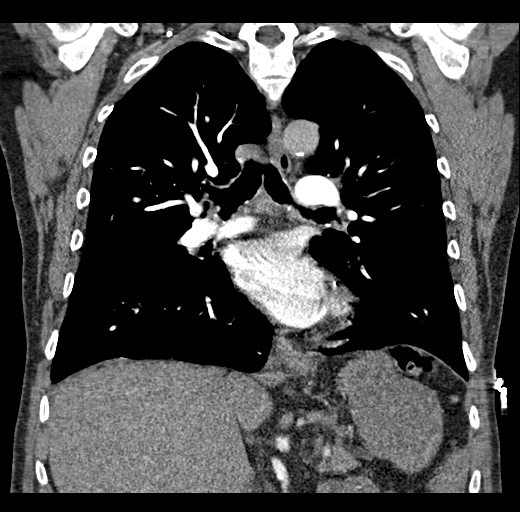
[im 113/151  soft-tissue]
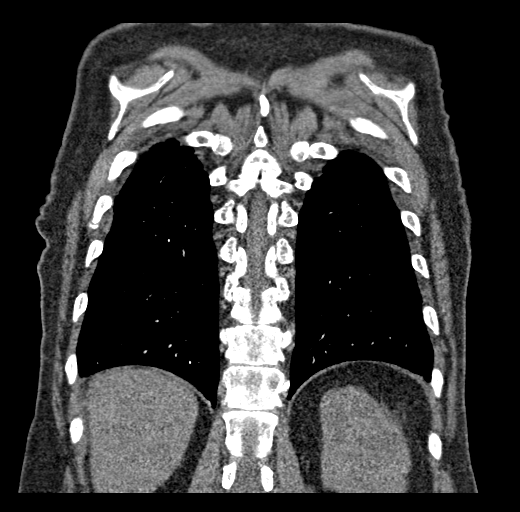

[17 of 46 positions shown; findings below may reference images not displayed]

FINDINGS: CTA CHEST FINDINGS

Cardiovascular: Thoracic aorta demonstrates atherosclerotic
calcification without aneurysmal dilatation or dissection. Pulmonary
artery shows a normal branching pattern bilaterally. No filling
defects to suggest pulmonary emboli are noted. Coronary
calcifications are seen. No significant cardiac enlargement is
noted.

Mediastinum/Nodes: Thoracic inlet is within normal limits. No hilar
or mediastinal adenopathy is seen. The esophagus as visualized is
within normal limits.

Lungs/Pleura: Diffuse emphysematous changes are identified
throughout both lungs. No focal infiltrate or sizable effusion is
seen. Previously noted inflammatory changes have resolved in the
interval. No sizable parenchymal nodules are seen.

Musculoskeletal: Mild degenerative changes of the thoracic spine are
seen. Old rib fractures with nonunion on the right are noted. No
acute rib abnormality is seen.

Review of the MIP images confirms the above findings.

CT ABDOMEN and PELVIS FINDINGS

Hepatobiliary: Liver is within normal limits. Gallbladder is
decompressed. Partially calcified gallstones are seen within the
gallbladder without complicating factors.

Pancreas: Unremarkable. No pancreatic ductal dilatation or
surrounding inflammatory changes.

Spleen: Spleen has been surgically removed.

Adrenals/Urinary Tract: Adrenal glands are within normal limits.
Kidneys show a normal enhancement pattern bilaterally. No renal
calculi or obstructive changes are seen. The bladder is partially
distended.

Stomach/Bowel: The appendix is within normal limits. No obstructive
or inflammatory changes of the colon are seen. Small bowel shows no
obstructive change. Herniated loop of small bowel in the right
inguinal hernia is seen without evidence of incarceration or
obstructive change. Stomach is within normal limits.

Vascular/Lymphatic: Aortic atherosclerosis. No enlarged abdominal or
pelvic lymph nodes.

Reproductive: Prostate is unremarkable.

Other: Right inguinal hernia containing a loop of small bowel
without incarceration. Previously seen ventral hernia has been
repaired. No recurrent herniation anteriorly is noted. No free fluid
is seen.

Musculoskeletal: No acute or significant osseous findings.

Review of the MIP images confirms the above findings.
IMPRESSION: CTA of the chest: No evidence of pulmonary emboli.

Emphysematous changes without acute infiltrate.

CT of the abdomen and pelvis: Repair of previously seen ventral
hernia.

Right inguinal hernia containing a loop of small bowel is seen
without evidence of incarceration.

Cholelithiasis without complicating factors.

Changes of prior splenectomy.

Aortic Atherosclerosis ([PK]-[PK]) and Emphysema ([PK]-[PK]).

## 2020-06-10 IMAGING — CT CT ABD-PELV W/ CM
2 of 5 series · 14 of 46 positions shown, 16 images · IV contrast (omnipaque)
Comparison: [DATE]

CLINICAL DATA: Chest and abdominal pain

EXAM:
CT ANGIOGRAPHY CHEST
CT ABDOMEN AND PELVIS WITH CONTRAST
TECHNIQUE: Multidetector CT imaging of the chest was performed using the
standard protocol during bolus administration of intravenous
contrast. Multiplanar CT image reconstructions and MIPs were
obtained to evaluate the vascular anatomy. Multidetector CT imaging
of the abdomen and pelvis was performed using the standard protocol
during bolus administration of intravenous contrast.
CONTRAST:  100mL OMNIPAQUE IOHEXOL 350 MG/ML SOLN

[Series 4: axial st · axial · 0.77mm/px · z∈[-624,-269]mm · 11 of 87 slices shown, 13 images]
[im 8/87  soft-tissue]
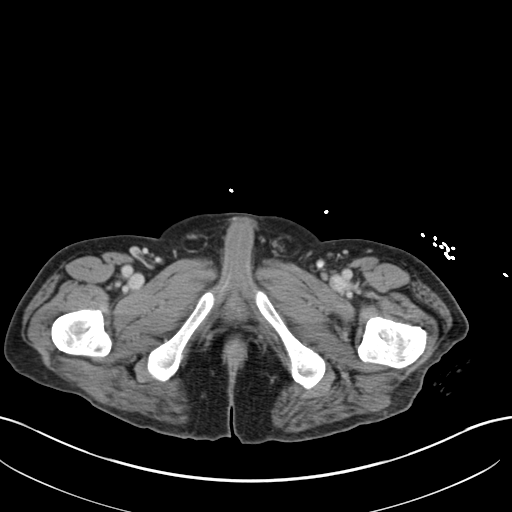
[im 8/87  bone]
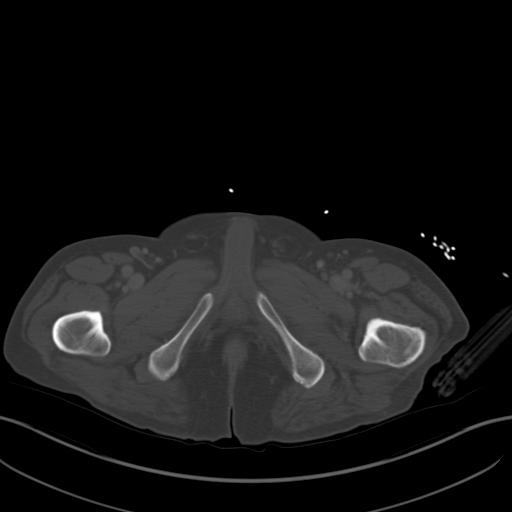
[im 15/87  soft-tissue]
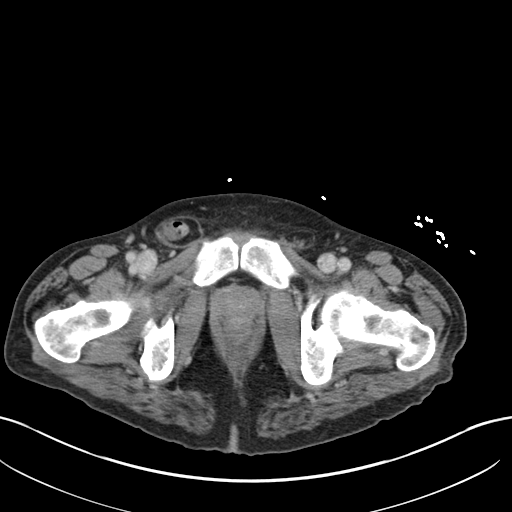
[im 22/87  soft-tissue]
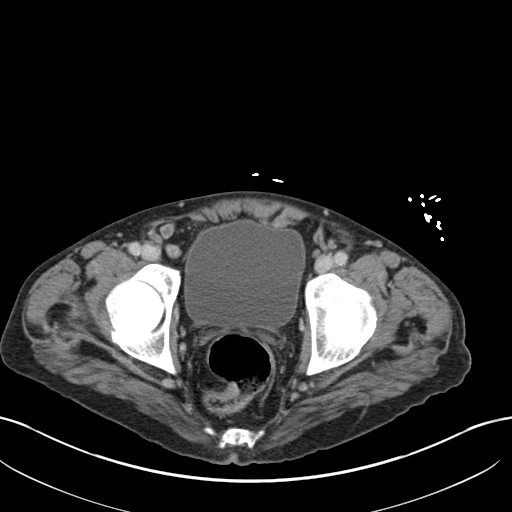
[im 29/87  soft-tissue]
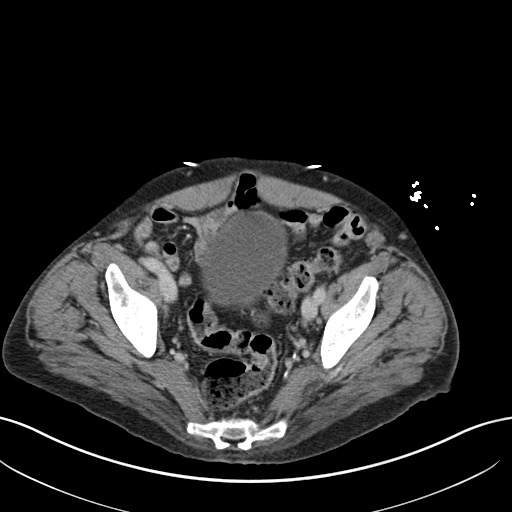
[im 36/87  soft-tissue]
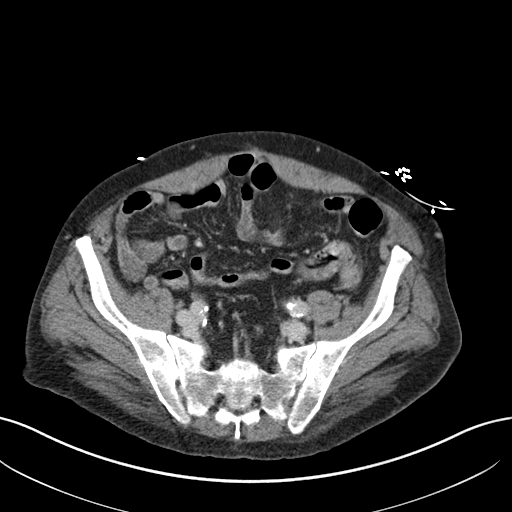
[im 44/87  soft-tissue]
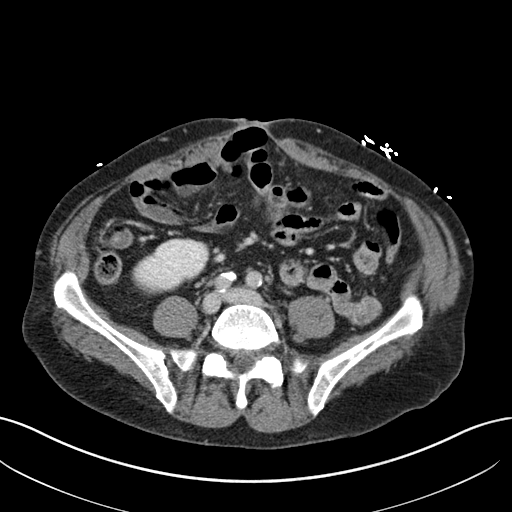
[im 51/87  soft-tissue]
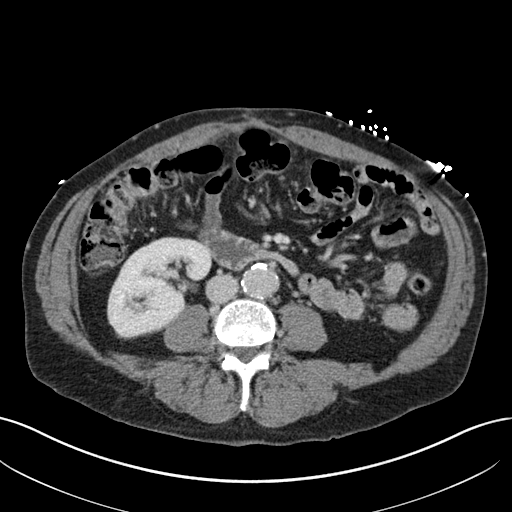
[im 58/87  soft-tissue]
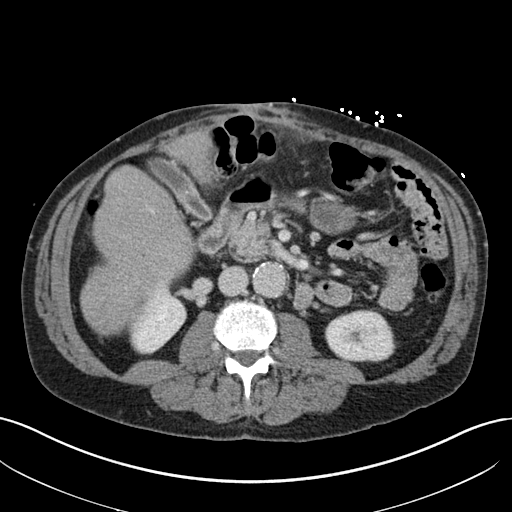
[im 65/87  soft-tissue]
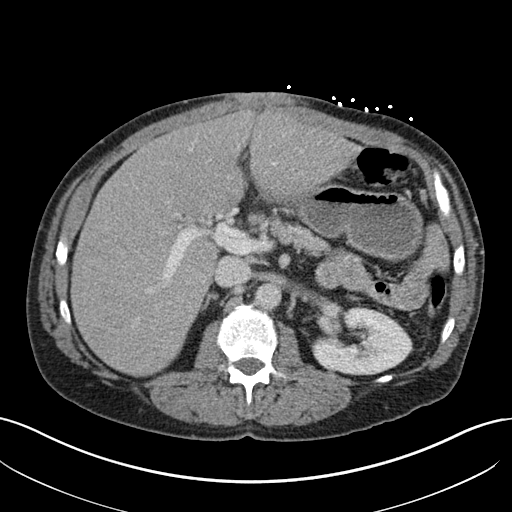
[im 65/87  bone]
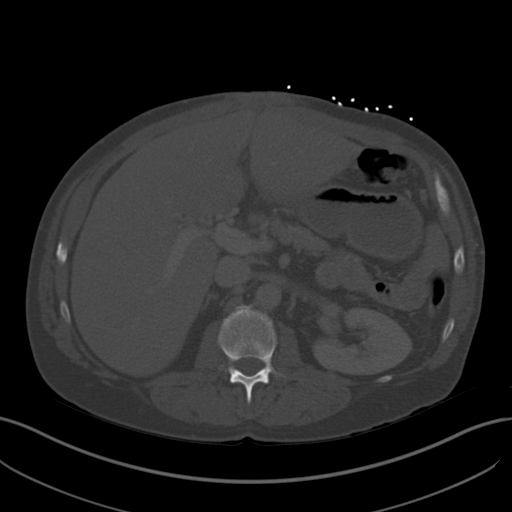
[im 72/87  soft-tissue]
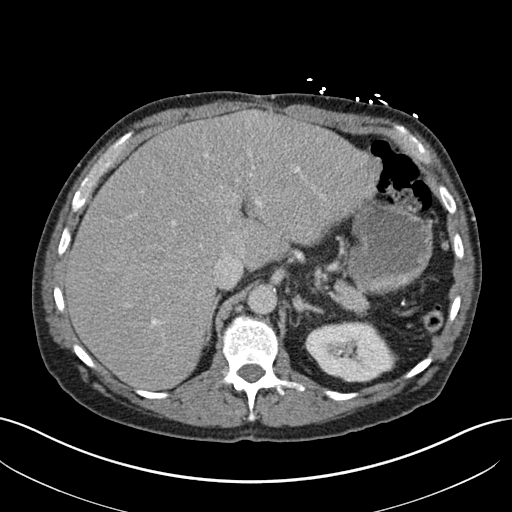
[im 79/87  soft-tissue]
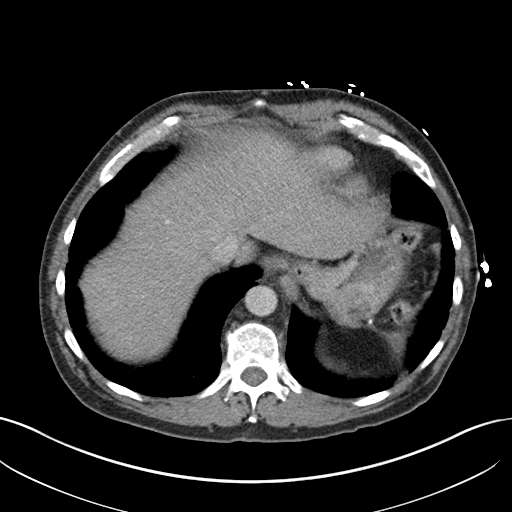

[Series 7: coronal st · coronal · 0.77mm/px · 3 of 105 slices shown]
[im 35/105  soft-tissue]
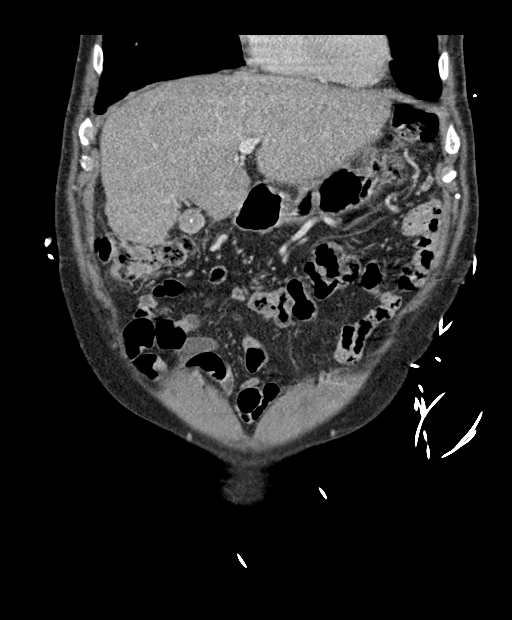
[im 47/105  soft-tissue]
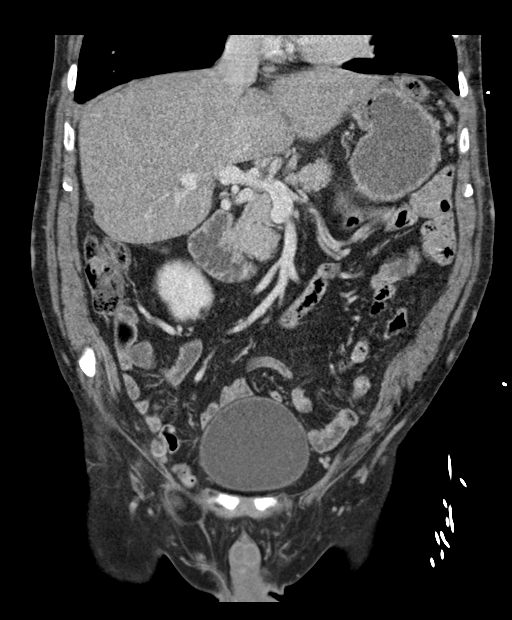
[im 58/105  soft-tissue]
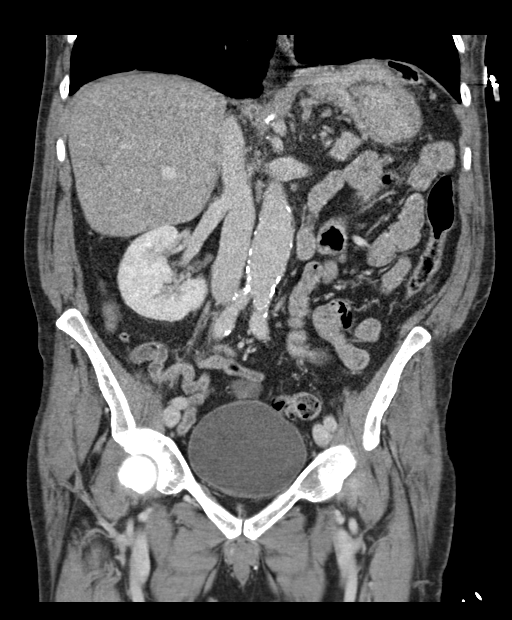

[14 of 46 positions shown; findings below may reference images not displayed]

FINDINGS: CTA CHEST FINDINGS

Cardiovascular: Thoracic aorta demonstrates atherosclerotic
calcification without aneurysmal dilatation or dissection. Pulmonary
artery shows a normal branching pattern bilaterally. No filling
defects to suggest pulmonary emboli are noted. Coronary
calcifications are seen. No significant cardiac enlargement is
noted.

Mediastinum/Nodes: Thoracic inlet is within normal limits. No hilar
or mediastinal adenopathy is seen. The esophagus as visualized is
within normal limits.

Lungs/Pleura: Diffuse emphysematous changes are identified
throughout both lungs. No focal infiltrate or sizable effusion is
seen. Previously noted inflammatory changes have resolved in the
interval. No sizable parenchymal nodules are seen.

Musculoskeletal: Mild degenerative changes of the thoracic spine are
seen. Old rib fractures with nonunion on the right are noted. No
acute rib abnormality is seen.

Review of the MIP images confirms the above findings.

CT ABDOMEN and PELVIS FINDINGS

Hepatobiliary: Liver is within normal limits. Gallbladder is
decompressed. Partially calcified gallstones are seen within the
gallbladder without complicating factors.

Pancreas: Unremarkable. No pancreatic ductal dilatation or
surrounding inflammatory changes.

Spleen: Spleen has been surgically removed.

Adrenals/Urinary Tract: Adrenal glands are within normal limits.
Kidneys show a normal enhancement pattern bilaterally. No renal
calculi or obstructive changes are seen. The bladder is partially
distended.

Stomach/Bowel: The appendix is within normal limits. No obstructive
or inflammatory changes of the colon are seen. Small bowel shows no
obstructive change. Herniated loop of small bowel in the right
inguinal hernia is seen without evidence of incarceration or
obstructive change. Stomach is within normal limits.

Vascular/Lymphatic: Aortic atherosclerosis. No enlarged abdominal or
pelvic lymph nodes.

Reproductive: Prostate is unremarkable.

Other: Right inguinal hernia containing a loop of small bowel
without incarceration. Previously seen ventral hernia has been
repaired. No recurrent herniation anteriorly is noted. No free fluid
is seen.

Musculoskeletal: No acute or significant osseous findings.

Review of the MIP images confirms the above findings.
IMPRESSION: CTA of the chest: No evidence of pulmonary emboli.

Emphysematous changes without acute infiltrate.

CT of the abdomen and pelvis: Repair of previously seen ventral
hernia.

Right inguinal hernia containing a loop of small bowel is seen
without evidence of incarceration.

Cholelithiasis without complicating factors.

Changes of prior splenectomy.

Aortic Atherosclerosis ([PK]-[PK]) and Emphysema ([PK]-[PK]).

## 2020-06-10 MED ORDER — IOHEXOL 350 MG/ML SOLN
100.0000 mL | Freq: Once | INTRAVENOUS | Status: AC | PRN
  Administered 2020-06-10: 100 mL via INTRAVENOUS

## 2020-06-10 MED ORDER — IPRATROPIUM-ALBUTEROL 0.5-2.5 (3) MG/3ML IN SOLN
3.0000 mL | Freq: Once | RESPIRATORY_TRACT | Status: AC
  Administered 2020-06-11: 3 mL via RESPIRATORY_TRACT
  Filled 2020-06-10: qty 3

## 2020-06-10 MED ORDER — ALBUTEROL SULFATE HFA 108 (90 BASE) MCG/ACT IN AERS
2.0000 | INHALATION_SPRAY | Freq: Once | RESPIRATORY_TRACT | Status: AC
  Administered 2020-06-10: 2 via RESPIRATORY_TRACT
  Filled 2020-06-10: qty 6.7

## 2020-06-10 MED ORDER — METHYLPREDNISOLONE SODIUM SUCC 125 MG IJ SOLR
80.0000 mg | Freq: Once | INTRAMUSCULAR | Status: AC
  Administered 2020-06-11: 80 mg via INTRAVENOUS
  Filled 2020-06-10: qty 2

## 2020-06-10 MED ORDER — IPRATROPIUM-ALBUTEROL 0.5-2.5 (3) MG/3ML IN SOLN
3.0000 mL | Freq: Once | RESPIRATORY_TRACT | Status: AC
  Administered 2020-06-10: 3 mL via RESPIRATORY_TRACT
  Filled 2020-06-10: qty 3

## 2020-06-10 NOTE — ED Triage Notes (Signed)
Pt reports chest pain, abdominal pain and body aches starting earlier this week. No distress noted. Hx of hernia repair in Oct 2021.

## 2020-06-10 NOTE — ED Provider Notes (Signed)
[facility] DEPT Provider Note   CSN: [id number] Arrival date & time: 06/10/20  1924     History Chief Complaint  Patient presents with  . Chest Pain    Marcus [name] is a 60 y.o. male history of COPD, Felty syndrome, active smoker, hernia repair  Patient presents today for evaluation of abdominal pain and chest pain.  Patient reports chest pain onset 5 months ago it has been a daily occurrence he reports that he was to be evaluated by cardiology however he became sick in November required hospitalization and reports he was lost to follow-up. He reports a left-sided lower chest pain a cramping sensation that occurs almost daily he cannot identify any clear aggravating or alleviating factors pain is currently moderate intensity he feels it is worsened over the last day. He reports is associated with exertional dyspnea.  Patient also reports abdominal pain that has been constant for the past 5 months and has not changed recently he describes epigastric abdominal pain he feels is related to his hernia and is moderate intensity aching cramp worsened with palpation and sitting up no alleviating factors or radiation of pain.  Denies fever/chills, fall/injury, headache, neck stiffness, cough/hemoptysis, dysuria/hematuria, extremity swelling/color change or any additional concerns.  HPI     Past Medical History:  Diagnosis Date  . COPD (chronic obstructive pulmonary disease) ([place])   . 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 Hernia, epigastric   . Pancytopenia ([place])   . Seropositive rheumatoid arthritis ([place])   . Tobacco use disorder     Patient Active Problem List   Diagnosis Date Noted  . Severe sepsis with septic shock ([place]) 01/20/2020  . Acute hypoxemic respiratory failure ([place]) 01/20/2020  . Hypokalemia 01/20/2020  . Perforated prepyloric ulcer 01/18/2020 01/18/2020  . Pneumoperitoneum 01/17/2020  . COPD with acute exacerbation ([place]) 08/18/2019  . 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 Anemia 11/14/2015  . Seropositive rheumatoid arthritis ([place])   . Chronic obstructive pulmonary disease ([place])   . Protein-calorie malnutrition, severe 07/24/2015  . Pancytopenia ([place]) 07/23/2015  . 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[place]) 06/06/2013  . Leukopenia 10/21/2012  . 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 Abnormal EKG 10/21/2012  . Joint pain 10/21/2012  . Splenomegaly 10/21/2012    Past Surgical History:  Procedure Laterality Date  . INCISIONAL HERNIA REPAIR  01/17/2020   Procedure: HERNIA REPAIR INCISIONAL AND Silvestre Gunner;  Surgeon: Kinsinger, Arta Bruce, MD;  Location: [facility] ORS;  Service: General;;  . LAPAROTOMY N/A 01/17/2020   Procedure: EXPLORATORY LAPAROTOMY;  Surgeon: Kieth Brightly Arta Bruce, MD;  Location: [facility] ORS;  Service: General;  Laterality: N/A;       Family History  Problem Relation Age of Onset  . CAD Brother   . COPD Brother     Social History   Tobacco Use  . Smoking status: Current Every Day Smoker    Packs/day: 1.00    Types: Cigarettes  . Smokeless tobacco: Never Used  Substance Use Topics  . Alcohol use: No    Alcohol/week: 0.0 standard drinks  . Drug use: Yes    Types: Marijuana    Home Medications Prior to Admission medications   Medication Sig Start Date End Date Taking? Authorizing Provider  acetaminophen (TYLENOL) 325 MG tablet Take 2 tablets (650 mg total) by mouth every 6 (six) hours. 01/24/20   Kinsinger, Arta Bruce, MD  albuterol (PROVENTIL) (2.5 MG/3ML) 0.083% nebulizer solution Take 3 mLs (2.5 mg total) by nebulization every 6 (six) hours as needed for wheezing  or shortness of breath. 01/24/20   Kinsinger, Arta Bruce, MD  methocarbamol (ROBAXIN) 500 MG tablet Take 2 tablets (1,000 mg total) by mouth every 8 (eight) hours. 01/24/20   Kinsinger, Arta Bruce, MD  mometasone-formoterol (DULERA) 200-5 MCG/ACT AERO Inhale 1 puff into the lungs 2 (two) times daily. 01/24/20   Kinsinger, Arta Bruce, MD  oxyCODONE (OXY IR/ROXICODONE) 5  MG immediate release tablet Take 1-2 tablets (5-10 mg total) by mouth every 4 (four) hours as needed for moderate pain or severe pain. 01/24/20   Kinsinger, Arta Bruce, MD  phenol (CHLORASEPTIC) 1.4 % LIQD Use as directed 1 spray in the mouth or throat as needed for throat irritation / pain.    [provider]    Allergies    Levaquin [levofloxacin in d5w] and Methotrexate  Review of Systems   Review of Systems Ten systems are reviewed and are negative for acute change except as noted in the HPI  Physical Exam Updated Vital Signs BP 140/74   Pulse 82   Temp 98.3 F (36.8 C) (Oral)   Resp (!) 24   Ht 5\' 10"  (1.778 m)   Wt 78 kg   SpO2 94%   BMI 24.68 kg/m   Physical Exam Constitutional:      General: He is not in acute distress.    Appearance: Normal appearance. He is well-developed. He is not ill-appearing or diaphoretic.  HENT:     Head: Normocephalic and atraumatic.  Eyes:     General: Vision grossly intact. Gaze aligned appropriately.     Pupils: Pupils are equal, round, and reactive to light.  Neck:     Trachea: Trachea and phonation normal.  Cardiovascular:     Rate and Rhythm: Normal rate and regular rhythm.  Pulmonary:     Effort: Pulmonary effort is normal. No respiratory distress.     Breath sounds: Decreased breath sounds and wheezing present.  Abdominal:     General: There is no distension.     Palpations: Abdomen is soft.     Tenderness: There is no abdominal tenderness. There is no guarding or rebound.     Comments: Well-healed laparotomy scar.  Ventral hernia present without overlying skin changes.  Musculoskeletal:        General: Normal range of motion.     Cervical back: Normal range of motion.       Feet:  Feet:     Comments: 2 scratches and mild surrounding cellulitis right foot. Skin:    General: Skin is warm and dry.  Neurological:     Mental Status: He is alert.     GCS: GCS eye subscore is 4. GCS verbal subscore is 5. GCS motor  subscore is 6.     Comments: Speech is clear and goal oriented, follows commands Major Cranial nerves without deficit, no facial droop Moves extremities without ataxia, coordination intact  Psychiatric:        Behavior: Behavior normal.       ED Results / Procedures / Treatments   Labs (all labs ordered are listed, but only abnormal results are displayed) Labs Reviewed  BASIC METABOLIC PANEL - Abnormal; Notable for the following components:      Result Value   Sodium 134 (*)    Calcium 8.3 (*)    All other components within normal limits  CBC - Abnormal; Notable for the following components:   WBC 3.9 (*)    RDW 18.7 (*)    Platelets 149 (*)  nRBC 3.6 (*)    All other components within normal limits  HEPATIC FUNCTION PANEL - Abnormal; Notable for the following components:   Albumin 2.8 (*)    All other components within normal limits  LIPASE, BLOOD - Abnormal; Notable for the following components:   Lipase 67 (*)    All other components within normal limits  BLOOD GAS, VENOUS - Abnormal; Notable for the following components:   Acid-Base Excess 2.5 (*)    All other components within normal limits  RESP PANEL BY RT-PCR (FLU A&B, COVID) ARPGX2  LACTIC ACID, PLASMA  LACTIC ACID, PLASMA  URINALYSIS, ROUTINE W REFLEX MICROSCOPIC  TROPONIN I (HIGH SENSITIVITY)  TROPONIN I (HIGH SENSITIVITY)    EKG None  Radiology DG Chest 2 View  Result Date: 06/10/2020 CLINICAL DATA:  Chest pain. EXAM: CHEST - 2 VIEW COMPARISON:  January 22, 2020 FINDINGS: There is no evidence of an acute infiltrate. A very small right pleural effusion is seen. No pneumothorax is identified. The heart size and mediastinal contours are within normal limits. Chronic sixth and seventh right rib fractures are seen. IMPRESSION: Very small right pleural effusion without evidence of acute infiltrate. Electronically Signed   By: Virgina Norfolk M.D.   On: 06/10/2020 20:25   CT Angio Chest PE W and/or Wo  Contrast  Result Date: 06/10/2020 CLINICAL DATA:  Chest and abdominal pain EXAM: CT ANGIOGRAPHY CHEST CT ABDOMEN AND PELVIS WITH CONTRAST TECHNIQUE: Multidetector CT imaging of the chest was performed using the standard protocol during bolus administration of intravenous contrast. Multiplanar CT image reconstructions and MIPs were obtained to evaluate the vascular anatomy. Multidetector CT imaging of the abdomen and pelvis was performed using the standard protocol during bolus administration of intravenous contrast. CONTRAST:  19mL OMNIPAQUE IOHEXOL 350 MG/ML SOLN COMPARISON:  01/17/2020 FINDINGS: CTA CHEST FINDINGS Cardiovascular: Thoracic aorta demonstrates atherosclerotic calcification without aneurysmal dilatation or dissection. Pulmonary artery shows a normal branching pattern bilaterally. No filling defects to suggest pulmonary emboli are noted. Coronary calcifications are seen. No significant cardiac enlargement is noted. Mediastinum/Nodes: Thoracic inlet is within normal limits. No hilar or mediastinal adenopathy is seen. The esophagus as visualized is within normal limits. Lungs/Pleura: Diffuse emphysematous changes are identified throughout both lungs. No focal infiltrate or sizable effusion is seen. Previously noted inflammatory changes have resolved in the interval. No sizable parenchymal nodules are seen. Musculoskeletal: Mild degenerative changes of the thoracic spine are seen. 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 No acute rib abnormality is seen. Review of the MIP images confirms the above findings. CT ABDOMEN and PELVIS FINDINGS Hepatobiliary: Liver is within normal limits. Gallbladder is decompressed. Partially calcified gallstones are seen within the gallbladder without complicating factors. Pancreas: Unremarkable. No pancreatic ductal dilatation or surrounding inflammatory changes. Spleen: Spleen has been surgically removed. Adrenals/Urinary Tract: Adrenal glands are within  normal limits. Kidneys show a normal enhancement pattern bilaterally. No renal calculi or obstructive changes are seen. The bladder is partially distended. Stomach/Bowel: The appendix is within normal limits. No obstructive or inflammatory changes of the colon are seen. Small bowel shows no obstructive change. Herniated loop of small bowel in the right inguinal hernia is seen without evidence of incarceration or obstructive change. Stomach is within normal limits. Vascular/Lymphatic: Aortic atherosclerosis. No enlarged abdominal or pelvic lymph nodes. Reproductive: Prostate is unremarkable. Other: Right inguinal hernia containing a loop of small bowel without incarceration. Previously seen ventral hernia has been repaired. No recurrent herniation anteriorly is noted. No free fluid  is seen. Musculoskeletal: No acute or significant osseous findings. Review of the MIP images confirms the above findings. IMPRESSION: CTA of the chest: No evidence of pulmonary emboli. Emphysematous changes without acute infiltrate. CT of the abdomen and pelvis: Repair of previously seen ventral hernia. Right inguinal hernia containing a loop of small bowel is seen without evidence of incarceration. Cholelithiasis without complicating factors. Changes of prior splenectomy. Aortic Atherosclerosis (ICD10-I70.0) and Emphysema (ICD10-J43.9). Electronically Signed   By: Inez Catalina M.D.   On: 06/10/2020 23:39   CT ABDOMEN PELVIS W CONTRAST  Result Date: 06/10/2020 CLINICAL DATA:  Chest and abdominal pain EXAM: CT ANGIOGRAPHY CHEST CT ABDOMEN AND PELVIS WITH CONTRAST TECHNIQUE: Multidetector CT imaging of the chest was performed using the standard protocol during bolus administration of intravenous contrast. Multiplanar CT image reconstructions and MIPs were obtained to evaluate the vascular anatomy. Multidetector CT imaging of the abdomen and pelvis was performed using the standard protocol during bolus administration of intravenous  contrast. CONTRAST:  163mL OMNIPAQUE IOHEXOL 350 MG/ML SOLN COMPARISON:  01/17/2020 FINDINGS: CTA CHEST FINDINGS Cardiovascular: Thoracic aorta demonstrates atherosclerotic calcification without aneurysmal dilatation or dissection. Pulmonary artery shows a normal branching pattern bilaterally. No filling defects to suggest pulmonary emboli are noted. Coronary calcifications are seen. No significant cardiac enlargement is noted. Mediastinum/Nodes: Thoracic inlet is within normal limits. No hilar or mediastinal adenopathy is seen. The esophagus as visualized is within normal limits. Lungs/Pleura: Diffuse emphysematous changes are identified throughout both lungs. No focal infiltrate or sizable effusion is seen. Previously noted inflammatory changes have resolved in the interval. No sizable parenchymal nodules are seen. Musculoskeletal: Mild degenerative changes of the thoracic spine are seen. 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 No acute rib abnormality is seen. Review of the MIP images confirms the above findings. CT ABDOMEN and PELVIS FINDINGS Hepatobiliary: Liver is within normal limits. Gallbladder is decompressed. Partially calcified gallstones are seen within the gallbladder without complicating factors. Pancreas: Unremarkable. No pancreatic ductal dilatation or surrounding inflammatory changes. Spleen: Spleen has been surgically removed. Adrenals/Urinary Tract: Adrenal glands are within normal limits. Kidneys show a normal enhancement pattern bilaterally. No renal calculi or obstructive changes are seen. The bladder is partially distended. Stomach/Bowel: The appendix is within normal limits. No obstructive or inflammatory changes of the colon are seen. Small bowel shows no obstructive change. Herniated loop of small bowel in the right inguinal hernia is seen without evidence of incarceration or obstructive change. Stomach is within normal limits. Vascular/Lymphatic: Aortic atherosclerosis. No  enlarged abdominal or pelvic lymph nodes. Reproductive: Prostate is unremarkable. Other: Right inguinal hernia containing a loop of small bowel without incarceration. Previously seen ventral hernia has been repaired. No recurrent herniation anteriorly is noted. No free fluid is seen. Musculoskeletal: No acute or significant osseous findings. Review of the MIP images confirms the above findings. IMPRESSION: CTA of the chest: No evidence of pulmonary emboli. Emphysematous changes without acute infiltrate. CT of the abdomen and pelvis: Repair of previously seen ventral hernia. Right inguinal hernia containing a loop of small bowel is seen without evidence of incarceration. Cholelithiasis without complicating factors. Changes of prior splenectomy. Aortic Atherosclerosis (ICD10-I70.0) and Emphysema (ICD10-J43.9). Electronically Signed   By: Inez Catalina M.D.   On: 06/10/2020 23:39   DG Abd Portable 1 View  Result Date: 06/10/2020 CLINICAL DATA:  Abdominal pain x5 months. EXAM: PORTABLE ABDOMEN - 1 VIEW COMPARISON:  January 19, 2020 FINDINGS: The bowel gas pattern is normal. No radio-opaque calculi or other significant  radiographic abnormality are seen. IMPRESSION: Negative. Electronically Signed   By: Virgina Norfolk M.D.   On: 06/10/2020 22:18    Procedures .Critical Care Performed by: Deliah Boston, PA-C Authorized by: Deliah Boston, PA-C   Critical care provider statement:    Critical care time (minutes):  35   Critical care was necessary to treat or prevent imminent or life-threatening deterioration of the following conditions:  Respiratory failure   Critical care was time spent personally by me on the following activities:  Discussions with consultants, evaluation of patient's response to treatment, examination of patient, ordering and performing treatments and interventions, ordering and review of laboratory studies, ordering and review of radiographic studies, pulse oximetry,  re-evaluation of patient's condition, obtaining history from patient or surrogate, review of old charts and development of treatment plan with patient or surrogate     Medications Ordered in ED Medications  doxycycline (VIBRA-TABS) tablet 100 mg (has no administration in time range)  albuterol (VENTOLIN HFA) 108 (90 Base) MCG/ACT inhaler 2 puff (2 puffs Inhalation Given 06/10/20 2117)  ipratropium-albuterol (DUONEB) 0.5-2.5 (3) MG/3ML nebulizer solution 3 mL (3 mLs Nebulization Given 06/10/20 2252)  iohexol (OMNIPAQUE) 350 MG/ML injection 100 mL (100 mLs Intravenous Contrast Given 06/10/20 2311)  methylPREDNISolone sodium succinate (SOLU-MEDROL) 125 mg/2 mL injection 80 mg (80 mg Intravenous Given 06/11/20 0008)  ipratropium-albuterol (DUONEB) 0.5-2.5 (3) MG/3ML nebulizer solution 3 mL (3 mLs Nebulization Given 06/11/20 0007)    ED Course  I have reviewed the triage vital signs and the nursing notes.  Pertinent labs & imaging results that were available during my care of the patient were reviewed by me and considered in my medical decision making (see chart for details).    MDM Rules/Calculators/A&P                         Additional history obtained from: 1. Nursing notes from this visit. 2. Electronic medical record review. ---- 60 year old male history as above presented for 2-month history of chest pain and abdominal pain.  He reports his chest pain has worsened over the past few days, chest pain work-up initiated in triage.  He has a history of COPD, he has diminished lung sounds and wheezes in bilateral lung fields.  SPO2 91% on room air.  Will give patient albuterol.  Chronic abdominal pain change patient.  Will obtain abdominal pain labs as well as Covid test.  Will need to await Covid test prior to nebulizers for possible COPD exacerbation  Incidentally during physical examination cellulitis of the right dorsal foot noticed he has 2 small scratches of the area, size appears mild there is  no fluctuance or induration to suggest abscess.  Patient reports no pain of the foot for the last few days was not aware of injury before. - Patient reassessed he is now requiring 3 L nasal cannula to maintain SPO2 mid 90s.  He reports no change to symptoms currently pain is mild.  However given new oxygen requirement will obtain CT angio PE study and CT abdomen pelvis as he reports increasing abdominal pain. - I ordered, reviewed and interpreted labs which include: Covid/influenza panel negative. Lipase elevated 67, similar to prior, patient without nausea or vomiting, suspicion for pancreatitis at this time. LFTs show no emergent elevations. CBC shows mild leukopenia of 3.9, no anemia. BMP shows no emergent electrolyte derangement, AKI or gap. VBG unremarkable. High-sensitivity troponin within normal limits, with multiple days of pain no indication for  delta troponin, doubt ACS. Lactic within normal limits, reassuring. Urinalysis pending  CXR:  IMPRESSION:  Very small right pleural effusion without evidence of acute  infiltrate.   DG Abd:  IMPRESSION:  Negative.   CT Angio Chest PE Study and Abd/Pelvis:  IMPRESSION:  CTA of the chest: No evidence of pulmonary emboli.    Emphysematous changes without acute infiltrate.    CT of the abdomen and pelvis: Repair of previously seen ventral  hernia.    Right inguinal hernia containing a loop of small bowel is seen  without evidence of incarceration.    Cholelithiasis without complicating factors.    Changes of prior splenectomy.    Aortic Atherosclerosis (ICD10-I70.0) and Emphysema (ICD10-J43.9).  - Patient reassessed he reports feeling improved after DuoNeb.  He is still requiring supplemental oxygen 3 L nasal cannula at this time SPO2 93-94% with resting.  Patient given Solu-Medrol for treatment of COPD exacerbation.  Additionally will start patient doxycycline for mild cellulitis of the right foot.  Patient is agreeable  for admission. - 12:13 AM: Consult with Dr. Roel Cluck, patient accepted for admission.  Case was discussed with attending physician Dr. Rex Kras who agrees with plan of care.   Note: Portions of this report may have been transcribed using voice recognition software. Every effort was made to ensure accuracy; however, inadvertent computerized transcription errors may still be present. Final Clinical Impression(s) / ED Diagnoses Final diagnoses:  COPD with acute exacerbation ([place])  Acute respiratory failure with hypoxia ([place])  Cellulitis of foot    Rx / DC Orders ED Discharge Orders    None       Gari Crown 06/11/20 0013    Little, Wenda Overland, MD 06/12/20 [phone]

## 2020-06-11 ENCOUNTER — Inpatient Hospital Stay (HOSPITAL_COMMUNITY): Payer: Medicaid Other

## 2020-06-11 DIAGNOSIS — L03119 Cellulitis of unspecified part of limb: Secondary | ICD-10-CM | POA: Diagnosis not present

## 2020-06-11 DIAGNOSIS — R079 Chest pain, unspecified: Secondary | ICD-10-CM | POA: Diagnosis not present

## 2020-06-11 DIAGNOSIS — I5032 Chronic diastolic (congestive) heart failure: Secondary | ICD-10-CM

## 2020-06-11 DIAGNOSIS — J441 Chronic obstructive pulmonary disease with (acute) exacerbation: Secondary | ICD-10-CM | POA: Diagnosis present

## 2020-06-11 DIAGNOSIS — M0500 Felty's syndrome, unspecified site: Secondary | ICD-10-CM | POA: Diagnosis present

## 2020-06-11 DIAGNOSIS — I2781 Cor pulmonale (chronic): Secondary | ICD-10-CM | POA: Diagnosis present

## 2020-06-11 DIAGNOSIS — D61818 Other pancytopenia: Secondary | ICD-10-CM | POA: Diagnosis present

## 2020-06-11 DIAGNOSIS — Z9081 Acquired absence of spleen: Secondary | ICD-10-CM | POA: Diagnosis not present

## 2020-06-11 DIAGNOSIS — Z888 Allergy status to other drugs, medicaments and biological substances status: Secondary | ICD-10-CM | POA: Diagnosis not present

## 2020-06-11 DIAGNOSIS — Z881 Allergy status to other antibiotic agents status: Secondary | ICD-10-CM | POA: Diagnosis not present

## 2020-06-11 DIAGNOSIS — Z79899 Other long term (current) drug therapy: Secondary | ICD-10-CM | POA: Diagnosis not present

## 2020-06-11 DIAGNOSIS — Z20822 Contact with and (suspected) exposure to covid-19: Secondary | ICD-10-CM | POA: Diagnosis present

## 2020-06-11 DIAGNOSIS — E861 Hypovolemia: Secondary | ICD-10-CM | POA: Diagnosis present

## 2020-06-11 DIAGNOSIS — F17210 Nicotine dependence, cigarettes, uncomplicated: Secondary | ICD-10-CM | POA: Diagnosis present

## 2020-06-11 DIAGNOSIS — K439 Ventral hernia without obstruction or gangrene: Secondary | ICD-10-CM | POA: Diagnosis present

## 2020-06-11 DIAGNOSIS — L03115 Cellulitis of right lower limb: Secondary | ICD-10-CM | POA: Diagnosis present

## 2020-06-11 DIAGNOSIS — L0390 Cellulitis, unspecified: Secondary | ICD-10-CM | POA: Diagnosis present

## 2020-06-11 DIAGNOSIS — J9601 Acute respiratory failure with hypoxia: Secondary | ICD-10-CM | POA: Diagnosis not present

## 2020-06-11 DIAGNOSIS — E871 Hypo-osmolality and hyponatremia: Secondary | ICD-10-CM | POA: Diagnosis present

## 2020-06-11 DIAGNOSIS — Z825 Family history of asthma and other chronic lower respiratory diseases: Secondary | ICD-10-CM | POA: Diagnosis not present

## 2020-06-11 LAB — RAPID URINE DRUG SCREEN, HOSP PERFORMED
Amphetamines: NOT DETECTED
Barbiturates: NOT DETECTED
Benzodiazepines: NOT DETECTED
Cocaine: NOT DETECTED
Opiates: NOT DETECTED
Tetrahydrocannabinol: NOT DETECTED

## 2020-06-11 LAB — COMPREHENSIVE METABOLIC PANEL
ALT: 16 U/L (ref 0–44)
AST: 40 U/L (ref 15–41)
Albumin: 2.8 g/dL — ABNORMAL LOW (ref 3.5–5.0)
Alkaline Phosphatase: 129 U/L — ABNORMAL HIGH (ref 38–126)
Anion gap: 7 (ref 5–15)
BUN: 11 mg/dL (ref 6–20)
CO2: 25 mmol/L (ref 22–32)
Calcium: 8.5 mg/dL — ABNORMAL LOW (ref 8.9–10.3)
Chloride: 101 mmol/L (ref 98–111)
Creatinine, Ser: 0.58 mg/dL — ABNORMAL LOW (ref 0.61–1.24)
GFR, Estimated: >60 mL/min (ref >60)
Glucose, Bld: 150 mg/dL — ABNORMAL HIGH (ref 70–99)
Potassium: 4.3 mmol/L (ref 3.5–5.1)
Sodium: 133 mmol/L — ABNORMAL LOW (ref 135–145)
Total Bilirubin: 0.7 mg/dL (ref 0.3–1.2)
Total Protein: 8.4 g/dL — ABNORMAL HIGH (ref 6.5–8.1)

## 2020-06-11 LAB — RESPIRATORY PANEL BY PCR

## 2020-06-11 LAB — URINALYSIS, ROUTINE W REFLEX MICROSCOPIC
Bilirubin Urine: NEGATIVE
Glucose, UA: NEGATIVE mg/dL
Hgb urine dipstick: NEGATIVE
Ketones, ur: NEGATIVE mg/dL
Leukocytes,Ua: NEGATIVE
Nitrite: NEGATIVE
Protein, ur: NEGATIVE mg/dL
Specific Gravity, Urine: 1.017 (ref 1.005–1.030)
pH: 6 (ref 5.0–8.0)

## 2020-06-11 LAB — HIV ANTIBODY (ROUTINE TESTING W REFLEX): HIV Screen 4th Generation wRfx: NONREACTIVE

## 2020-06-11 LAB — PHOSPHORUS: Phosphorus: 3.8 mg/dL (ref 2.5–4.6)

## 2020-06-11 LAB — CBC WITH DIFFERENTIAL/PLATELET
Abs Immature Granulocytes: 0.02 10*3/uL (ref 0.00–0.07)
Basophils Absolute: 0 10*3/uL (ref 0.0–0.1)
Basophils Relative: 0 %
Eosinophils Absolute: 0 10*3/uL (ref 0.0–0.5)
Eosinophils Relative: 0 %
HCT: 50.8 % (ref 39.0–52.0)
Hemoglobin: 16 g/dL (ref 13.0–17.0)
Immature Granulocytes: 1 %
Lymphocytes Relative: 90 %
Lymphs Abs: 2.7 10*3/uL (ref 0.7–4.0)
MCH: 27.4 pg (ref 26.0–34.0)
MCHC: 31.5 g/dL (ref 30.0–36.0)
MCV: 87 fL (ref 80.0–100.0)
Monocytes Absolute: 0.1 10*3/uL (ref 0.1–1.0)
Monocytes Relative: 4 %
Neutro Abs: 0.2 10*3/uL — CL (ref 1.7–7.7)
Neutrophils Relative %: 5 %
Platelets: 151 10*3/uL (ref 150–400)
RBC: 5.84 MIL/uL — ABNORMAL HIGH (ref 4.22–5.81)
RDW: 18.7 % — ABNORMAL HIGH (ref 11.5–15.5)
WBC: 3.2 10*3/uL — ABNORMAL LOW (ref 4.0–10.5)
nRBC: 4.7 % — ABNORMAL HIGH (ref 0.0–0.2)

## 2020-06-11 LAB — ETHANOL: Alcohol, Ethyl (B): <10 mg/dL (ref <10)

## 2020-06-11 LAB — MRSA PCR SCREENING: MRSA by PCR: NEGATIVE

## 2020-06-11 LAB — TSH: TSH: 0.652 u[IU]/mL (ref 0.350–4.500)

## 2020-06-11 LAB — MAGNESIUM
Magnesium: 2.2 mg/dL (ref 1.7–2.4)
Magnesium: 2.2 mg/dL (ref 1.7–2.4)

## 2020-06-11 LAB — ECHOCARDIOGRAM COMPLETE
Area-P 1/2: 2.11 cm2
Height: 70 in
S' Lateral: 3.5 cm
Weight: 2752 oz

## 2020-06-11 LAB — PREALBUMIN: Prealbumin: 16.9 mg/dL — ABNORMAL LOW (ref 18–38)

## 2020-06-11 MED ORDER — NICOTINE 21 MG/24HR TD PT24
21.0000 mg | MEDICATED_PATCH | Freq: Every day | TRANSDERMAL | Status: DC
  Administered 2020-06-11 – 2020-06-12 (×2): 21 mg via TRANSDERMAL
  Filled 2020-06-11 (×2): qty 1

## 2020-06-11 MED ORDER — MOMETASONE FURO-FORMOTEROL FUM 100-5 MCG/ACT IN AERO
2.0000 | INHALATION_SPRAY | Freq: Two times a day (BID) | RESPIRATORY_TRACT | Status: DC
  Administered 2020-06-11 – 2020-06-12 (×2): 2 via RESPIRATORY_TRACT
  Filled 2020-06-11: qty 8.8

## 2020-06-11 MED ORDER — CEPHALEXIN 500 MG PO CAPS
500.0000 mg | ORAL_CAPSULE | Freq: Four times a day (QID) | ORAL | Status: DC
  Administered 2020-06-11 – 2020-06-12 (×6): 500 mg via ORAL
  Filled 2020-06-11 (×6): qty 1

## 2020-06-11 MED ORDER — ALBUTEROL SULFATE (2.5 MG/3ML) 0.083% IN NEBU
2.5000 mg | INHALATION_SOLUTION | RESPIRATORY_TRACT | Status: DC | PRN
  Administered 2020-06-11: 2.5 mg via RESPIRATORY_TRACT
  Filled 2020-06-11 (×2): qty 3

## 2020-06-11 MED ORDER — HYDROCODONE-ACETAMINOPHEN 5-325 MG PO TABS
1.0000 | ORAL_TABLET | ORAL | Status: DC | PRN
Start: 2020-06-11 — End: 2020-06-12
  Administered 2020-06-11 – 2020-06-12 (×4): 2 via ORAL
  Filled 2020-06-11 (×4): qty 2

## 2020-06-11 MED ORDER — ENOXAPARIN SODIUM 40 MG/0.4ML ~~LOC~~ SOLN
40.0000 mg | SUBCUTANEOUS | Status: DC
  Administered 2020-06-11 – 2020-06-12 (×2): 40 mg via SUBCUTANEOUS
  Filled 2020-06-11 (×2): qty 0.4

## 2020-06-11 MED ORDER — DOXYCYCLINE HYCLATE 100 MG PO TABS
100.0000 mg | ORAL_TABLET | Freq: Once | ORAL | Status: AC
  Administered 2020-06-11: 100 mg via ORAL
  Filled 2020-06-11: qty 1

## 2020-06-11 MED ORDER — PREDNISONE 20 MG PO TABS
40.0000 mg | ORAL_TABLET | Freq: Every day | ORAL | Status: DC
  Administered 2020-06-12: 40 mg via ORAL
  Filled 2020-06-11 (×2): qty 2

## 2020-06-11 MED ORDER — OXYCODONE HCL 5 MG PO TABS: 5.0000 mg | ORAL_TABLET | ORAL | Status: DC | PRN

## 2020-06-11 MED ORDER — METHYLPREDNISOLONE SODIUM SUCC 40 MG IJ SOLR
40.0000 mg | Freq: Two times a day (BID) | INTRAMUSCULAR | Status: AC
  Administered 2020-06-11 (×2): 40 mg via INTRAVENOUS
  Filled 2020-06-11 (×2): qty 1

## 2020-06-11 MED ORDER — SODIUM CHLORIDE 0.9 % IV SOLN
75.0000 mL/h | INTRAVENOUS | Status: AC
  Administered 2020-06-11: 75 mL/h via INTRAVENOUS

## 2020-06-11 MED ORDER — ACETAMINOPHEN 325 MG PO TABS: 650.0000 mg | ORAL_TABLET | Freq: Four times a day (QID) | ORAL | Status: DC | PRN

## 2020-06-11 MED ORDER — METHOCARBAMOL 500 MG PO TABS
1000.0000 mg | ORAL_TABLET | Freq: Three times a day (TID) | ORAL | Status: DC
  Filled 2020-06-11: qty 2

## 2020-06-11 MED ORDER — IPRATROPIUM-ALBUTEROL 0.5-2.5 (3) MG/3ML IN SOLN
3.0000 mL | Freq: Four times a day (QID) | RESPIRATORY_TRACT | Status: DC
  Administered 2020-06-11 – 2020-06-12 (×5): 3 mL via RESPIRATORY_TRACT
  Filled 2020-06-11 (×5): qty 3

## 2020-06-11 MED ORDER — ACETAMINOPHEN 650 MG RE SUPP: 650.0000 mg | Freq: Four times a day (QID) | RECTAL | Status: DC | PRN

## 2020-06-11 MED ORDER — MOMETASONE FURO-FORMOTEROL FUM 200-5 MCG/ACT IN AERO
1.0000 | INHALATION_SPRAY | Freq: Two times a day (BID) | RESPIRATORY_TRACT | Status: DC
  Filled 2020-06-11: qty 8.8

## 2020-06-11 NOTE — Progress Notes (Signed)
  Echocardiogram 2D Echocardiogram has been performed.  Jennette Dubin 06/11/2020, 11:56 AM

## 2020-06-11 NOTE — Evaluation (Signed)
Physical Therapy Evaluation Patient Details Name: Marcus [name] MRN: [id number] DOB: 09/09/1960 Today's Date: 06/11/2020   History of Present Illness  Marcus [name] is an 60 y.o. male who has a history of exploratory laparotomy by Dr. Kieth Brightly on the evening of 10/12 as well as perforated pyloric channel ulcer and incisional hernia which was repaired as well. 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 Pt admitted for COPD exacerbation and new O2 requirements.  Clinical Impression  The patient is independent in mobility, ambulated x 220', no balance loss. SPO2 on and of of 2 L lowest 88%.  No further PT. RN  can  Saturation ambulation prior to DC.    Follow Up Recommendations No PT follow up    Equipment Recommendations  None recommended by PT    Recommendations for Other Services       Precautions / Restrictions Precautions Precautions: Fall Precaution Comments: monitor sats Restrictions Weight Bearing Restrictions: No      Mobility  Bed Mobility Overal bed mobility: Modified Independent                  Transfers Overall transfer level: Modified independent               General transfer comment: Please also see PT note for hallway ambulation.  Ambulation/Gait Ambulation/Gait assistance: Modified independent (Device/Increase time) Gait Distance (Feet): 220 Feet Assistive device: None Gait Pattern/deviations: Step-through pattern;WFL(Within Functional Limits)   Gait velocity interpretation: >2.62 ft/sec, indicative of community ambulatory General Gait Details: no balance loss  Stairs            Wheelchair Mobility    Modified Rankin (Stroke Patients Only)       Balance Overall balance assessment: Modified Independent                                           Pertinent Vitals/Pain Pain Assessment:  Faces Pain Score: 10-Worst pain ever Faces Pain Scale: Hurts little more Pain Location: Gut Pain Descriptors / Indicators: Discomfort Pain Intervention(s): Monitored during session    Home Living Family/patient expects to be discharged to:: Private residence Living Arrangements: Parent Available Help at Discharge: Family;Available 24 hours/day Type of Home: House Home Access: Stairs to enter Entrance Stairs-Rails: Right;Left;Can reach both Entrance Stairs-Number of Steps: 4 Home Layout: One level Home Equipment: Walker - 2 wheels;Cane - single point      Prior Function Level of Independence: Independent         Comments: Full community ambulator without assistive device. Drives. Pt fully independent with ADLs/IADLs but mother helps out with household responsibilities.  Pt reports that his mother is 16 years old but "gets around better than I do."     Hand Dominance   Dominant Hand: Left    Extremity/Trunk Assessment   Upper Extremity Assessment Upper Extremity Assessment: Overall WFL for tasks assessed    Lower Extremity Assessment Lower Extremity Assessment: Overall WFL for tasks assessed    Cervical / Trunk Assessment Cervical / Trunk Assessment: Normal  Communication   Communication: No difficulties  Cognition Arousal/Alertness: Awake/alert Behavior During Therapy: WFL for tasks assessed/performed Overall Cognitive Status: Within Functional Limits for tasks assessed  General Comments      Exercises     Assessment/Plan    PT Assessment Patent does not need any further PT services  PT Problem List         PT Treatment Interventions      PT Goals (Current goals can be found in the Care Plan section)  Acute Rehab PT Goals Patient Stated Goal: To not need therapy. PT Goal Formulation: All assessment and education complete, DC therapy    Frequency     Barriers to discharge         Co-evaluation               AM-PAC PT "6 Clicks" Mobility  Outcome Measure Help needed turning from your back to your side while in a flat bed without using bedrails?: None Help needed moving from lying on your back to sitting on the side of a flat bed without using bedrails?: None Help needed moving to and from a bed to a chair (including a wheelchair)?: None Help needed standing up from a chair using your arms (e.g., wheelchair or bedside chair)?: None Help needed to walk in hospital room?: None Help needed climbing 3-5 steps with a railing? : None 6 Click Score: 24    End of Session Equipment Utilized During Treatment: Oxygen Activity Tolerance: Patient tolerated treatment well Patient left: in bed;with call bell/phone within reach;with bed alarm set Nurse Communication: Mobility status PT Visit Diagnosis: Difficulty in walking, not elsewhere classified (R26.2)    Time: 1660-6301 PT Time Calculation (min) (ACUTE ONLY): 23 min   Charges:   PT Evaluation $PT Eval Low Complexity: [place] Pager [phone] Office [phone]   Claretha Cooper 06/11/2020, 11:18 AM

## 2020-06-11 NOTE — Progress Notes (Signed)
[facility] PROGRESS NOTE   ALIK MAWSON UDJ:[id number] DOB: [birth year] DOA: 06/10/2020  PCP: Pamella Pert, MD  Brief History/Interval Summary: 60 y.o. male with medical history significant of COPD, perforated prepyloric ulcer, Felty syndrome, pancytopenia, rheumatoid arthritis, tobacco abuse, chronic diastolic CHF, cor pulmonale who presented with body aches chest pain abdominal pain ongoing for a week.  Patient underwent CT scan of the chest abdomen and pelvis which did not reveal any acute findings.  He was hospitalized due to concern for COPD exacerbation.  He has new oxygen requirements.  Consultants: None  Procedures: None  Antibiotics: Anti-infectives (From admission, onward)   Start     Dose/Rate Route Frequency Ordered Stop   06/11/20 0045  cephALEXin (KEFLEX) capsule 500 mg        500 mg Oral Every 6 hours 06/11/20 0032 06/15/20 2359   06/11/20 0015  doxycycline (VIBRA-TABS) tablet 100 mg        100 mg Oral  Once 06/11/20 0008 06/11/20 0014      Subjective/Interval History: Patient mentions that he is feeling slightly better today compared to yesterday.  Denies any nausea vomiting.  Denies any chest pain this morning.  Shortness of breath is improving.    Assessment/Plan:  Acute respiratory failure with hypoxia/COPD with acute exacerbation Patient with new oxygen requirements.  Currently on 3 L of oxygen saturating in the 90s.  Patient was noted to be wheezing as per ED reports.  CT angiogram did not show any PE.  Emphysematous changes were noted without any acute infiltrates.  Patient started on nebulizer treatments also on Solu-Medrol.  Started on Keflex.  Will likely need home oxygen at discharge.  Chest pain Troponin was negative.  EKG is abnormal with T wave inversions but is similar to previous.  Echocardiogram done in October 2021 showed normal systolic function with the patient was noted to have depressed RV function and was seen by cardiology then.   He is known to have chronic cor pulmonale.  Supposed to see pulmonology but has not done so yet.  Follow-up on repeat echocardiogram.  Chest pain is atypical for cardiac etiology.  Leukopenia Continue to monitor  Right foot cellulitis This is mild over the dorsal aspect.  Continue with antibiotics.  He has good peripheral pulses.  Chronic diastolic CHF Stable.  No evidence for acute decompensation.  Hyponatremia Continue to monitor.  Tobacco abuse Counseled.   DVT Prophylaxis: Lovenox Code Status: Full code Family Communication: Discussed with the patient Disposition Plan: hopefully return home when improved  Status is: Inpatient  Remains inpatient appropriate because:IV treatments appropriate due to intensity of illness or inability to take PO and Inpatient level of care appropriate due to severity of illness   Dispo: The patient is from: Home              Anticipated d/c is to: Home              Patient currently is not medically stable to d/c.   Difficult to place patient No      Medications:  Scheduled: . cephALEXin  500 mg Oral Q6H  . enoxaparin (LOVENOX) injection  40 mg Subcutaneous Q24H  . ipratropium-albuterol  3 mL Nebulization Q6H  . methylPREDNISolone (SOLU-MEDROL) injection  40 mg Intravenous Q12H   Followed by  . [START ON 06/12/2020] predniSONE  40 mg Oral Q breakfast  . nicotine  21 mg Transdermal Daily   Continuous: . sodium chloride 75 mL/hr (06/11/20 0100)  GMW:NUUVOZDGUYQIH **OR** acetaminophen, albuterol, HYDROcodone-acetaminophen   Objective:  Vital Signs  Vitals:   06/11/20 0615 06/11/20 0700 06/11/20 0750 06/11/20 0839  BP: (!) 111/94 120/74  134/83  Pulse: 64 68  68  Resp: (!) 26 (!) 23  (!) 26  Temp:   97.6 F (36.4 C) 97.6 F (36.4 C)  TempSrc:   Oral Oral  SpO2: 95% 95%  94%  Weight:      Height:       No intake or output data in the 24 hours ending 06/11/20 0850 Filed Weights   06/10/20 1934  Weight: 78 kg     General appearance: Awake alert.  In no distress Resp: Mildly tachypneic.  Coarse breath sounds bilaterally with few wheezes at the bases.  No rhonchi. Cardio: S1-S2 is normal regular.  No S3-S4.  No rubs murmurs or bruit GI: Abdomen is soft.  Nontender nondistended.  Bowel sounds are present normal.  No masses organomegaly Extremities: No edema.  Erythema noted over the dorsal aspect of the right foot.  Good peripheral pulses. Neurologic: Alert and oriented x3.  No focal neurological deficits.    Lab Results:  Data Reviewed: I have personally reviewed following labs and imaging studies  CBC: Recent Labs  Lab 06/10/20 2017 06/11/20 0457  WBC 3.9* 3.2*  NEUTROABS  --  0.2*  HGB 15.8 16.0  HCT 50.3 50.8  MCV 87.2 87.0  PLT 149* 474    Basic Metabolic Panel: Recent Labs  Lab 06/10/20 2017 06/11/20 0015 06/11/20 0457  NA 134*  --  133*  K 3.7  --  4.3  CL 99  --  101  CO2 27  --  25  GLUCOSE 82  --  150*  BUN 14  --  11  CREATININE 0.63  --  0.58*  CALCIUM 8.3*  --  8.5*  MG  --  2.2 2.2  PHOS  --   --  3.8    GFR: Estimated Creatinine Clearance: 102.7 mL/min (A) (by C-G formula based on SCr of 0.58 mg/dL (L)).  Liver Function Tests: Recent Labs  Lab 06/10/20 2017 06/11/20 0457  AST 39 40  ALT 16 16  ALKPHOS 119 129*  BILITOT 0.7 0.7  PROT 8.1 8.4*  ALBUMIN 2.8* 2.8*    Recent Labs  Lab 06/10/20 2017  LIPASE 67*    Thyroid Function Tests: Recent Labs    06/11/20 0457  TSH 0.652     Recent Results (from the past 240 hour(s))  Resp Panel by RT-PCR (Flu A&B, Covid) Nasopharyngeal Swab     Status: None   Collection Time: 06/10/20  7:58 PM   Specimen: Nasopharyngeal Swab; Nasopharyngeal(NP) swabs in vial transport medium  Result Value Ref Range Status   SARS Coronavirus 2 by RT PCR NEGATIVE NEGATIVE Final    Comment: (NOTE) SARS-CoV-2 target nucleic acids are NOT DETECTED.  The SARS-CoV-2 RNA is generally detectable in upper  respiratory specimens during the acute phase of infection. The lowest concentration of SARS-CoV-2 viral copies this assay can detect is 138 copies/mL. A negative result does not preclude SARS-Cov-2 infection and should not be used as the sole basis for treatment or other patient management decisions. A negative result may occur with  improper specimen collection/handling, submission of specimen other than nasopharyngeal swab, presence of viral mutation(s) within the areas targeted by this assay, and inadequate number of viral copies(<138 copies/mL). A negative result must be combined with clinical observations, patient history, and epidemiological information. The expected  result is Negative.  Fact Sheet for Patients:  EntrepreneurPulse.com.au  Fact Sheet for Healthcare Providers:  IncredibleEmployment.be  This test is no t yet approved or cleared by the [place] FDA and  has been authorized for detection and/or diagnosis of SARS-CoV-2 by FDA under an Emergency Use Authorization (EUA). This EUA will remain  in effect (meaning this test can be used) for the duration of the COVID-19 declaration under Section 564(b)(1) of the Act, 21 U.S.C.section 360bbb-3(b)(1), unless the authorization is terminated  or revoked sooner.       Influenza A by PCR NEGATIVE NEGATIVE Final   Influenza B by PCR NEGATIVE NEGATIVE Final    Comment: (NOTE) The Xpert Xpress SARS-CoV-2/FLU/RSV plus assay is intended as an aid in the diagnosis of influenza from Nasopharyngeal swab specimens and should not be used as a sole basis for treatment. Nasal washings and aspirates are unacceptable for Xpert Xpress SARS-CoV-2/FLU/RSV testing.  Fact Sheet for Patients: EntrepreneurPulse.com.au  Fact Sheet for Healthcare Providers: IncredibleEmployment.be  This test is not yet approved or cleared by the [place] FDA and has been  authorized for detection and/or diagnosis of SARS-CoV-2 by FDA under an Emergency Use Authorization (EUA). This EUA will remain in effect (meaning this test can be used) for the duration of the COVID-19 declaration under Section 564(b)(1) of the Act, 21 U.S.C. section 360bbb-3(b)(1), unless the authorization is terminated or revoked.  Performed at [facility], [place] [address] [place], [place] [address]   MRSA PCR Screening     Status: None   Collection Time: 06/11/20  4:57 AM   Specimen: Nasal Mucosa; Nasopharyngeal  Result Value Ref Range Status   MRSA by PCR NEGATIVE NEGATIVE Final    Comment:        The GeneXpert MRSA Assay (FDA approved for NASAL specimens only), is one component of a comprehensive MRSA colonization surveillance program. It is not intended to diagnose MRSA infection nor to guide or monitor treatment for MRSA infections. Performed at [facility], [place] [address], [place], [place] [address]       Radiology Studies: DG Chest 2 View  Result Date: 06/10/2020 CLINICAL DATA:  Chest pain. EXAM: CHEST - 2 VIEW COMPARISON:  January 22, 2020 FINDINGS: There is no evidence of an acute infiltrate. A very small right pleural effusion is seen. No pneumothorax is identified. The heart size and mediastinal contours are within normal limits. Chronic sixth and seventh right rib fractures are seen. IMPRESSION: Very small right pleural effusion without evidence of acute infiltrate. Electronically Signed   By: Virgina Norfolk M.D.   On: 06/10/2020 20:25   CT Angio Chest PE W and/or Wo Contrast  Result Date: 06/10/2020 CLINICAL DATA:  Chest and abdominal pain EXAM: CT ANGIOGRAPHY CHEST CT ABDOMEN AND PELVIS WITH CONTRAST TECHNIQUE: Multidetector CT imaging of the chest was performed using the standard protocol during bolus administration of intravenous contrast. Multiplanar CT image reconstructions and MIPs were obtained to evaluate the  vascular anatomy. Multidetector CT imaging of the abdomen and pelvis was performed using the standard protocol during bolus administration of intravenous contrast. CONTRAST:  184mL OMNIPAQUE IOHEXOL 350 MG/ML SOLN COMPARISON:  01/17/2020 FINDINGS: CTA CHEST FINDINGS Cardiovascular: Thoracic aorta demonstrates atherosclerotic calcification without aneurysmal dilatation or dissection. Pulmonary artery shows a normal branching pattern bilaterally. No filling defects to suggest pulmonary emboli are noted. Coronary calcifications are seen. No significant cardiac enlargement is noted. Mediastinum/Nodes: Thoracic inlet is within normal limits. No hilar or mediastinal adenopathy  is seen. The esophagus as visualized is within normal limits. Lungs/Pleura: Diffuse emphysematous changes are identified throughout both lungs. No focal infiltrate or sizable effusion is seen. Previously noted inflammatory changes have resolved in the interval. No sizable parenchymal nodules are seen. Musculoskeletal: Mild degenerative changes of the thoracic spine are seen. 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 No acute rib abnormality is seen. Review of the MIP images confirms the above findings. CT ABDOMEN and PELVIS FINDINGS Hepatobiliary: Liver is within normal limits. Gallbladder is decompressed. Partially calcified gallstones are seen within the gallbladder without complicating factors. Pancreas: Unremarkable. No pancreatic ductal dilatation or surrounding inflammatory changes. Spleen: Spleen has been surgically removed. Adrenals/Urinary Tract: Adrenal glands are within normal limits. Kidneys show a normal enhancement pattern bilaterally. No renal calculi or obstructive changes are seen. The bladder is partially distended. Stomach/Bowel: The appendix is within normal limits. No obstructive or inflammatory changes of the colon are seen. Small bowel shows no obstructive change. Herniated loop of small bowel in the right  inguinal hernia is seen without evidence of incarceration or obstructive change. Stomach is within normal limits. Vascular/Lymphatic: Aortic atherosclerosis. No enlarged abdominal or pelvic lymph nodes. Reproductive: Prostate is unremarkable. Other: Right inguinal hernia containing a loop of small bowel without incarceration. Previously seen ventral hernia has been repaired. No recurrent herniation anteriorly is noted. No free fluid is seen. Musculoskeletal: No acute or significant osseous findings. Review of the MIP images confirms the above findings. IMPRESSION: CTA of the chest: No evidence of pulmonary emboli. Emphysematous changes without acute infiltrate. CT of the abdomen and pelvis: Repair of previously seen ventral hernia. Right inguinal hernia containing a loop of small bowel is seen without evidence of incarceration. Cholelithiasis without complicating factors. Changes of prior splenectomy. Aortic Atherosclerosis (ICD10-I70.0) and Emphysema (ICD10-J43.9). Electronically Signed   By: Inez Catalina M.D.   On: 06/10/2020 23:39   CT ABDOMEN PELVIS W CONTRAST  Result Date: 06/10/2020 CLINICAL DATA:  Chest and abdominal pain EXAM: CT ANGIOGRAPHY CHEST CT ABDOMEN AND PELVIS WITH CONTRAST TECHNIQUE: Multidetector CT imaging of the chest was performed using the standard protocol during bolus administration of intravenous contrast. Multiplanar CT image reconstructions and MIPs were obtained to evaluate the vascular anatomy. Multidetector CT imaging of the abdomen and pelvis was performed using the standard protocol during bolus administration of intravenous contrast. CONTRAST:  119mL OMNIPAQUE IOHEXOL 350 MG/ML SOLN COMPARISON:  01/17/2020 FINDINGS: CTA CHEST FINDINGS Cardiovascular: Thoracic aorta demonstrates atherosclerotic calcification without aneurysmal dilatation or dissection. Pulmonary artery shows a normal branching pattern bilaterally. No filling defects to suggest pulmonary emboli are noted. Coronary  calcifications are seen. No significant cardiac enlargement is noted. Mediastinum/Nodes: Thoracic inlet is within normal limits. No hilar or mediastinal adenopathy is seen. The esophagus as visualized is within normal limits. Lungs/Pleura: Diffuse emphysematous changes are identified throughout both lungs. No focal infiltrate or sizable effusion is seen. Previously noted inflammatory changes have resolved in the interval. No sizable parenchymal nodules are seen. Musculoskeletal: Mild degenerative changes of the thoracic spine are seen. 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 No acute rib abnormality is seen. Review of the MIP images confirms the above findings. CT ABDOMEN and PELVIS FINDINGS Hepatobiliary: Liver is within normal limits. Gallbladder is decompressed. Partially calcified gallstones are seen within the gallbladder without complicating factors. Pancreas: Unremarkable. No pancreatic ductal dilatation or surrounding inflammatory changes. Spleen: Spleen has been surgically removed. Adrenals/Urinary Tract: Adrenal glands are within normal limits. Kidneys show a normal  enhancement pattern bilaterally. No renal calculi or obstructive changes are seen. The bladder is partially distended. Stomach/Bowel: The appendix is within normal limits. No obstructive or inflammatory changes of the colon are seen. Small bowel shows no obstructive change. Herniated loop of small bowel in the right inguinal hernia is seen without evidence of incarceration or obstructive change. Stomach is within normal limits. Vascular/Lymphatic: Aortic atherosclerosis. No enlarged abdominal or pelvic lymph nodes. Reproductive: Prostate is unremarkable. Other: Right inguinal hernia containing a loop of small bowel without incarceration. Previously seen ventral hernia has been repaired. No recurrent herniation anteriorly is noted. No free fluid is seen. Musculoskeletal: No acute or significant osseous findings. Review of the MIP  images confirms the above findings. IMPRESSION: CTA of the chest: No evidence of pulmonary emboli. Emphysematous changes without acute infiltrate. CT of the abdomen and pelvis: Repair of previously seen ventral hernia. Right inguinal hernia containing a loop of small bowel is seen without evidence of incarceration. Cholelithiasis without complicating factors. Changes of prior splenectomy. Aortic Atherosclerosis (ICD10-I70.0) and Emphysema (ICD10-J43.9). Electronically Signed   By: Inez Catalina M.D.   On: 06/10/2020 23:39   DG Abd Portable 1 View  Result Date: 06/10/2020 CLINICAL DATA:  Abdominal pain x5 months. EXAM: PORTABLE ABDOMEN - 1 VIEW COMPARISON:  January 19, 2020 FINDINGS: The bowel gas pattern is normal. No radio-opaque calculi or other significant radiographic abnormality are seen. IMPRESSION: Negative. Electronically Signed   By: Virgina Norfolk M.D.   On: 06/10/2020 22:18       LOS: 0 days   [facility] Pager on [email or web address]  06/11/2020, 8:50 AM

## 2020-06-11 NOTE — Evaluation (Signed)
Occupational Therapy Evaluation Patient Details Name: Marcus [name] MRN: [id number] DOB: 1960/12/23 Today's Date: 06/11/2020    History of Present Illness Marcus [name] is an 59 y.o. male who has a history of exploratory laparotomy by Dr. Kieth Brightly on the evening of 10/12 as well as perforated pyloric channel ulcer and incisional hernia which was repaired as well. 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 Pt admitted for COPD exacerbation and new O2 requirements.   Clinical Impression   Patient evaluated by Occupational Therapy with no further acute OT needs identified. All education has been completed and the patient has no further questions.  See below for any follow-up Occupational Therapy or equipment needs. OT is signing off. Thank you for this referral.     Follow Up Recommendations  No OT follow up    Equipment Recommendations  Tub/shower seat    Recommendations for Other Services       Precautions / Restrictions Precautions Precautions: Fall Restrictions Weight Bearing Restrictions: No      Mobility Bed Mobility Overal bed mobility: Modified Independent                  Transfers Overall transfer level: Modified independent               General transfer comment: Please also see PT note for hallway ambulation.    Balance Overall balance assessment: Independent;Modified Independent                                         ADL either performed or assessed with clinical judgement   ADL Overall ADL's : At baseline                                       General ADL Comments: Pt able to demonstrate sitting EOB to don socks Mod I, bed and transfers Mod I and ambulating in hallway with supervision to Mod I (supervision to monitor O2 and lines).     Vision   Vision Assessment?: No apparent visual  deficits     Perception     Praxis      Pertinent Vitals/Pain Pain Assessment: Faces Pain Score: 10-Worst pain ever Faces Pain Scale: Hurts little more Pain Location: Gut Pain Intervention(s): Monitored during session;Limited activity within patient's tolerance;Premedicated before session     Hand Dominance Left   Extremity/Trunk Assessment Upper Extremity Assessment Upper Extremity Assessment: Overall WFL for tasks assessed   Lower Extremity Assessment Lower Extremity Assessment: Defer to PT evaluation   Cervical / Trunk Assessment Cervical / Trunk Assessment: Normal   Communication Communication Communication: No difficulties   Cognition Arousal/Alertness: Awake/alert Behavior During Therapy: WFL for tasks assessed/performed Overall Cognitive Status: Within Functional Limits for tasks assessed                                     General Comments       Exercises     Shoulder Instructions      Home Living Family/patient expects to be discharged to:: Private residence Living Arrangements: Parent Available Help at Discharge: Family;Available 24 hours/day Type of Home: House Home Access: Stairs  to enter Entrance Stairs-Number of Steps: 4 Entrance Stairs-Rails: Right;Left;Can reach both Home Layout: One level     Bathroom Shower/Tub: Teacher, early years/pre: Standard     Home Equipment: Environmental consultant - 2 wheels;Cane - single point          Prior Functioning/Environment Level of Independence: Independent        Comments: Full community ambulator without assistive device. Drives. Pt fully independent with ADLs/IADLs but mother helps out with household responsibilities.  Pt reports that his mother is 87 years old but "gets around better than I do."        OT Problem List: Pain      OT Treatment/Interventions:      OT Goals(Current goals can be found in the care plan section) Acute Rehab OT Goals Patient Stated Goal: To not need  therapy. OT Goal Formulation: With patient Potential to Achieve Goals: Good  OT Frequency:     Barriers to D/C:            Co-evaluation              AM-PAC OT "6 Clicks" Daily Activity     Outcome Measure Help from another person eating meals?: None Help from another person taking care of personal grooming?: None Help from another person toileting, which includes using toliet, bedpan, or urinal?: None Help from another person bathing (including washing, rinsing, drying)?: None Help from another person to put on and taking off regular upper body clothing?: None Help from another person to put on and taking off regular lower body clothing?: None 6 Click Score: 24   End of Session Equipment Utilized During Treatment: Oxygen Nurse Communication: Mobility status  Activity Tolerance: Patient tolerated treatment well Patient left: in bed;with bed alarm set;with call bell/phone within reach  OT Visit Diagnosis: Pain Pain - part of body:  (Gut)                Time: 1025-8527 OT Time Calculation (min): 16 min Charges:  OT General Charges $OT Visit: 1 Visit OT Evaluation $OT Eval Low Complexity: 1 Low  Wes Lezotte, [place] Office: [phone] 06/11/2020  Julien Girt 06/11/2020, 11:01 AM

## 2020-06-11 NOTE — H&P (Signed)
Marcus [name] SJG:[id number] DOB: 01-Apr-1961 DOA: 06/10/2020  PCP: Pamella Pert, MD   Outpatient Specialists:  NONE    Patient arrived to ER on 06/10/20 at 1924 Referred by Attending Little, Wenda Overland, MD   Patient coming from: home Lives  With family    Chief Complaint:   Chief Complaint  Patient presents with  . Chest Pain    HPI: Marcus [name] is a 60 y.o. male with medical history significant of COPD, perforated prepyloric ulcer, Felty syndrome, pancytopenia, rheumatoid arthritis, tobacco abuse  chronic diastolic CHF  Presented with chest pain abdominal pain body aches throughout the week. Cough increased mucus production, continues to smoke In October 2021 was admitted for perforated pyloric ulcer resulting in severe sepsis and acute respiratory failure and prolonged hospitalization. Reports chest pain is almost daily he has been getting somewhat worse for the past 1 day.  Feels like cramping around his chest, worse after he coughs very hard Sometimes associated exertional dyspnea he has been having constant abdominal pain for the past 5 No fevers or chills no headache    Infectious risk factors:  Reports , shortness of breath,  cough,     Has   been vaccinated against COVID     Initial COVID TEST  NEGATIVE   Lab Results  Component Value Date   SARSCOV2NAA NEGATIVE 06/10/2020   [place] NEGATIVE 01/17/2020   [place] NEGATIVE 11/30/2019   [place] NEGATIVE 08/18/2019     Regarding pertinent Chronic problems:     COPD - not on baseline oxygen   controlled at home with nebulizers  Chronic diastolic CHF grade 2 per echogram in October 2021 While in ER: Noted to be tachypneic up to 30 wheezing throughout the decreased breath sounds some scratches and mild swelling around one of the scratches on the right foot. Patient was treated for COPD exacerbation with Solu-Medrol DuoNeb albuterol Covid was negative Noted to be hypoxic requiring up to 3 L of  O2 noted to maintain sats in 90s. VBG no evidence of hypercapnia Troponin unremarkable CTA was negative for PE ED Triage Vitals  Enc Vitals Group     BP 06/10/20 1934 124/79     Pulse Rate 06/10/20 1934 85     Resp 06/10/20 1934 20     Temp 06/10/20 1934 97.9 F (36.6 C)     Temp Source 06/10/20 1934 Oral     SpO2 06/10/20 1934 91 %     Weight 06/10/20 1934 172 lb (78 kg)     Height 06/10/20 1934 5\' 10"  (1.778 m)     Head Circumference --      Peak Flow --      Pain Score 06/10/20 1946 9     Pain Loc --      Pain Edu? --      Excl. in [place]? --   TMAX(24)@     _________________________________________ Significant initial  Findings: Abnormal Labs Reviewed  BASIC METABOLIC PANEL - Abnormal; Notable for the following components:      Result Value   Sodium 134 (*)    Calcium 8.3 (*)    All other components within normal limits  CBC - Abnormal; Notable for the following components:   WBC 3.9 (*)    RDW 18.7 (*)    Platelets 149 (*)    nRBC 3.6 (*)    All other components within normal limits  HEPATIC FUNCTION PANEL - Abnormal; Notable for the following components:   Albumin  2.8 (*)    All other components within normal limits  LIPASE, BLOOD - Abnormal; Notable for the following components:   Lipase 67 (*)    All other components within normal limits  BLOOD GAS, VENOUS - Abnormal; Notable for the following components:   Acid-Base Excess 2.5 (*)    All other components within normal limits   ____________________________________________ Ordered   CXR -   NON acute  CTabd/pelvis -   nonacute  CTA chest -  nonacute, no PE,   no evidence of infiltrate   _________________________ Troponin 8 ECG: Ordered Personally reviewed by me showing: HR : 84 Rhythm:  RBBB,    nonspecific changes,   QTC 479 ____________________   WBC     Component Value Date/Time   WBC 3.9 (L) 06/10/2020 2017   LYMPHSABS 0.5 (L) 01/19/2020 0530   MONOABS 1.1 (H) 01/19/2020 0530   EOSABS 0.0  01/19/2020 0530   BASOSABS 0.0 01/19/2020 0530    Lactic Acid, Venous    Component Value Date/Time   LATICACIDVEN 1.4 06/10/2020 2158       UA  no evidence of UTI     Urine analysis:    Component Value Date/Time   COLORURINE YELLOW 06/10/2020 1958   APPEARANCEUR CLEAR 06/10/2020 1958   LABSPEC 1.017 06/10/2020 1958   PHURINE 6.0 06/10/2020 1958   GLUCOSEU NEGATIVE 06/10/2020 1958   HGBUR NEGATIVE 06/10/2020 1958   BILIRUBINUR NEGATIVE 06/10/2020 1958   KETONESUR NEGATIVE 06/10/2020 1958   PROTEINUR NEGATIVE 06/10/2020 1958   UROBILINOGEN 0.2 10/21/2012 1737   NITRITE NEGATIVE 06/10/2020 1958   LEUKOCYTESUR NEGATIVE 06/10/2020 1958    Results for orders placed or performed during the hospital encounter of 06/10/20  Resp Panel by RT-PCR (Flu A&B, Covid) Nasopharyngeal Swab     Status: None   Collection Time: 06/10/20  7:58 PM   Specimen: Nasopharyngeal Swab; Nasopharyngeal(NP) swabs in vial transport medium  Result Value Ref Range Status   SARS Coronavirus 2 by RT PCR NEGATIVE NEGATIVE Final        Influenza A by PCR NEGATIVE NEGATIVE Final   Influenza B by PCR NEGATIVE NEGATIVE Final         __________________________________ Hospitalist was called for admission for COPD exacerbation  The following Work up has been ordered so far:  Orders Placed This Encounter  Procedures  . Resp Panel by RT-PCR (Flu A&B, Covid) Nasopharyngeal Swab  . DG Chest 2 View  . DG Abd Portable 1 View  . CT Angio Chest PE W and/or Wo Contrast  . CT ABDOMEN PELVIS W CONTRAST  . Basic metabolic panel  . CBC  . Hepatic function panel  . Lipase, blood  . Urinalysis, Routine w reflex microscopic  . Lactic acid, plasma  . Blood gas, venous (at [facility] and [facility], not at [facility])  . Document Height and Actual Weight  . Consult to hospitalist  . ED EKG    Following Medications were ordered in ER: Medications  methylPREDNISolone sodium succinate (SOLU-MEDROL) 125 mg/2 mL injection 80 mg (has no  administration in time range)  ipratropium-albuterol (DUONEB) 0.5-2.5 (3) MG/3ML nebulizer solution 3 mL (has no administration in time range)  albuterol (VENTOLIN HFA) 108 (90 Base) MCG/ACT inhaler 2 puff (2 puffs Inhalation Given 06/10/20 2117)  ipratropium-albuterol (DUONEB) 0.5-2.5 (3) MG/3ML nebulizer solution 3 mL (3 mLs Nebulization Given 06/10/20 2252)  iohexol (OMNIPAQUE) 350 MG/ML injection 100 mL (100 mLs Intravenous Contrast Given 06/10/20 2311)  Consult Orders  (From admission, onward)         Start     Ordered   06/10/20 2350  Consult to hospitalist  Once       Provider:  (Not yet assigned)  Question Answer Comment  Place call to: [facility]   Reason for Consult Admit      06/10/20 2349          OTHER Significant initial  Findings:  labs showing:    Recent Labs  Lab 06/10/20 2017  NA 134*  K 3.7  CO2 27  GLUCOSE 82  BUN 14  CREATININE 0.63  CALCIUM 8.3*    Cr    stable,    Lab Results  Component Value Date   CREATININE 0.63 06/10/2020   CREATININE 0.52 (L) 01/23/2020   CREATININE 0.60 (L) 01/22/2020    Recent Labs  Lab 06/10/20 2017  AST 39  ALT 16  ALKPHOS 119  BILITOT 0.7  PROT 8.1  ALBUMIN 2.8*   Lab Results  Component Value Date   CALCIUM 8.3 (L) 06/10/2020   PHOS 5.2 (H) 01/18/2020       Plt: Lab Results  Component Value Date   PLT 149 (L) 06/10/2020     Venous  Blood Gas result:  pH 7.37  PCO2 48.8 ;   ABG    Component Value Date/Time   PHART 7.312 (L) 01/18/2020 0029   PCO2ART 57.6 (H) 01/18/2020 0029   PO2ART 104 01/18/2020 0029   HCO3 28.0 06/10/2020 2311   TCO2 31 01/18/2020 0029   O2SAT 68.8 06/10/2020 2311         Recent Labs  Lab 06/10/20 2017  WBC 3.9*  HGB 15.8  HCT 50.3  MCV 87.2  PLT 149*    HG/HCT   stable     Component Value Date/Time   HGB 15.8 06/10/2020 2017   HCT 50.3 06/10/2020 2017   MCV 87.2 06/10/2020 2017     Recent Labs  Lab 06/10/20 2017  LIPASE 67*         Cultures:    Component Value Date/Time   SDES BLOOD LEFT WRIST 01/17/2020 1459   SPECREQUEST  01/17/2020 1459    BOTTLES DRAWN AEROBIC AND ANAEROBIC Blood Culture adequate volume   CULT  01/17/2020 1459    NO GROWTH 6 DAYS Performed at [facility], [address], [place], [place] [address]    REPTSTATUS 01/23/2020 FINAL 01/17/2020 1459     Radiological Exams on Admission: DG Chest 2 View  Result Date: 06/10/2020 CLINICAL DATA:  Chest pain. EXAM: CHEST - 2 VIEW COMPARISON:  January 22, 2020 FINDINGS: There is no evidence of an acute infiltrate. A very small right pleural effusion is seen. No pneumothorax is identified. The heart size and mediastinal contours are within normal limits. Chronic sixth and seventh right rib fractures are seen. IMPRESSION: Very small right pleural effusion without evidence of acute infiltrate. Electronically Signed   By: Virgina Norfolk M.D.   On: 06/10/2020 20:25   CT Angio Chest PE W and/or Wo Contrast  Result Date: 06/10/2020 CLINICAL DATA:  Chest and abdominal pain EXAM: CT ANGIOGRAPHY CHEST CT ABDOMEN AND PELVIS WITH CONTRAST TECHNIQUE: Multidetector CT imaging of the chest was performed using the standard protocol during bolus administration of intravenous contrast. Multiplanar CT image reconstructions and MIPs were obtained to evaluate the vascular anatomy. Multidetector CT imaging of the abdomen and pelvis was performed using the standard protocol during bolus administration of intravenous contrast.  CONTRAST:  17mL OMNIPAQUE IOHEXOL 350 MG/ML SOLN COMPARISON:  01/17/2020 FINDINGS: CTA CHEST FINDINGS Cardiovascular: Thoracic aorta demonstrates atherosclerotic calcification without aneurysmal dilatation or dissection. Pulmonary artery shows a normal branching pattern bilaterally. No filling defects to suggest pulmonary emboli are noted. Coronary calcifications are seen. No significant cardiac enlargement is noted. Mediastinum/Nodes: Thoracic inlet is within  normal limits. No hilar or mediastinal adenopathy is seen. The esophagus as visualized is within normal limits. Lungs/Pleura: Diffuse emphysematous changes are identified throughout both lungs. No focal infiltrate or sizable effusion is seen. Previously noted inflammatory changes have resolved in the interval. No sizable parenchymal nodules are seen. Musculoskeletal: Mild degenerative changes of the thoracic spine are seen. 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 No acute rib abnormality is seen. Review of the MIP images confirms the above findings. CT ABDOMEN and PELVIS FINDINGS Hepatobiliary: Liver is within normal limits. Gallbladder is decompressed. Partially calcified gallstones are seen within the gallbladder without complicating factors. Pancreas: Unremarkable. No pancreatic ductal dilatation or surrounding inflammatory changes. Spleen: Spleen has been surgically removed. Adrenals/Urinary Tract: Adrenal glands are within normal limits. Kidneys show a normal enhancement pattern bilaterally. No renal calculi or obstructive changes are seen. The bladder is partially distended. Stomach/Bowel: The appendix is within normal limits. No obstructive or inflammatory changes of the colon are seen. Small bowel shows no obstructive change. Herniated loop of small bowel in the right inguinal hernia is seen without evidence of incarceration or obstructive change. Stomach is within normal limits. Vascular/Lymphatic: Aortic atherosclerosis. No enlarged abdominal or pelvic lymph nodes. Reproductive: Prostate is unremarkable. Other: Right inguinal hernia containing a loop of small bowel without incarceration. Previously seen ventral hernia has been repaired. No recurrent herniation anteriorly is noted. No free fluid is seen. Musculoskeletal: No acute or significant osseous findings. Review of the MIP images confirms the above findings. IMPRESSION: CTA of the chest: No evidence of pulmonary emboli. Emphysematous  changes without acute infiltrate. CT of the abdomen and pelvis: Repair of previously seen ventral hernia. Right inguinal hernia containing a loop of small bowel is seen without evidence of incarceration. Cholelithiasis without complicating factors. Changes of prior splenectomy. Aortic Atherosclerosis (ICD10-I70.0) and Emphysema (ICD10-J43.9). Electronically Signed   By: Inez Catalina M.D.   On: 06/10/2020 23:39   CT ABDOMEN PELVIS W CONTRAST  Result Date: 06/10/2020 CLINICAL DATA:  Chest and abdominal pain EXAM: CT ANGIOGRAPHY CHEST CT ABDOMEN AND PELVIS WITH CONTRAST TECHNIQUE: Multidetector CT imaging of the chest was performed using the standard protocol during bolus administration of intravenous contrast. Multiplanar CT image reconstructions and MIPs were obtained to evaluate the vascular anatomy. Multidetector CT imaging of the abdomen and pelvis was performed using the standard protocol during bolus administration of intravenous contrast. CONTRAST:  135mL OMNIPAQUE IOHEXOL 350 MG/ML SOLN COMPARISON:  01/17/2020 FINDINGS: CTA CHEST FINDINGS Cardiovascular: Thoracic aorta demonstrates atherosclerotic calcification without aneurysmal dilatation or dissection. Pulmonary artery shows a normal branching pattern bilaterally. No filling defects to suggest pulmonary emboli are noted. Coronary calcifications are seen. No significant cardiac enlargement is noted. Mediastinum/Nodes: Thoracic inlet is within normal limits. No hilar or mediastinal adenopathy is seen. The esophagus as visualized is within normal limits. Lungs/Pleura: Diffuse emphysematous changes are identified throughout both lungs. No focal infiltrate or sizable effusion is seen. Previously noted inflammatory changes have resolved in the interval. No sizable parenchymal nodules are seen. Musculoskeletal: Mild degenerative changes of the thoracic spine are seen. 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 No acute rib  abnormality is seen.  Review of the MIP images confirms the above findings. CT ABDOMEN and PELVIS FINDINGS Hepatobiliary: Liver is within normal limits. Gallbladder is decompressed. Partially calcified gallstones are seen within the gallbladder without complicating factors. Pancreas: Unremarkable. No pancreatic ductal dilatation or surrounding inflammatory changes. Spleen: Spleen has been surgically removed. Adrenals/Urinary Tract: Adrenal glands are within normal limits. Kidneys show a normal enhancement pattern bilaterally. No renal calculi or obstructive changes are seen. The bladder is partially distended. Stomach/Bowel: The appendix is within normal limits. No obstructive or inflammatory changes of the colon are seen. Small bowel shows no obstructive change. Herniated loop of small bowel in the right inguinal hernia is seen without evidence of incarceration or obstructive change. Stomach is within normal limits. Vascular/Lymphatic: Aortic atherosclerosis. No enlarged abdominal or pelvic lymph nodes. Reproductive: Prostate is unremarkable. Other: Right inguinal hernia containing a loop of small bowel without incarceration. Previously seen ventral hernia has been repaired. No recurrent herniation anteriorly is noted. No free fluid is seen. Musculoskeletal: No acute or significant osseous findings. Review of the MIP images confirms the above findings. IMPRESSION: CTA of the chest: No evidence of pulmonary emboli. Emphysematous changes without acute infiltrate. CT of the abdomen and pelvis: Repair of previously seen ventral hernia. Right inguinal hernia containing a loop of small bowel is seen without evidence of incarceration. Cholelithiasis without complicating factors. Changes of prior splenectomy. Aortic Atherosclerosis (ICD10-I70.0) and Emphysema (ICD10-J43.9). Electronically Signed   By: Inez Catalina M.D.   On: 06/10/2020 23:39   DG Abd Portable 1 View  Result Date: 06/10/2020 CLINICAL DATA:  Abdominal pain x5 months. EXAM:  PORTABLE ABDOMEN - 1 VIEW COMPARISON:  January 19, 2020 FINDINGS: The bowel gas pattern is normal. No radio-opaque calculi or other significant radiographic abnormality are seen. IMPRESSION: Negative. Electronically Signed   By: Virgina Norfolk M.D.   On: 06/10/2020 22:18   _______________________________________________________________________________________________________ Latest  Blood pressure 123/87, pulse 75, temperature 98.3 F (36.8 C), temperature source Oral, resp. rate (!) 30, height 5\' 10"  (1.778 m), weight 78 kg, SpO2 94 %.   Review of Systems:    Pertinent positives include:   chills, fatigue  shortness of breath at rest.  dyspnea on exertion  chest pain,  wheezing. Constitutional:  No weight loss, night sweats, Fevers, , weight loss  HEENT:  No headaches, Difficulty swallowing,Tooth/dental problems,Sore throat,  No sneezing, itching, ear ache, nasal congestion, post nasal drip,  Cardio-vascular:  No Orthopnea, PND, anasarca, dizziness, palpitations.no Bilateral lower extremity swelling  GI:  No heartburn, indigestion, abdominal pain, nausea, vomiting, diarrhea, change in bowel habits, loss of appetite, melena, blood in stool, hematemesis Resp:  no, No excess mucus, no productive cough, No non-productive cough, No coughing up of blood.No change in color of mucus.No Skin:  no rash or lesions. No jaundice GU:  no dysuria, change in color of urine, no urgency or frequency. No straining to urinate.  No flank pain.  Musculoskeletal:  No joint pain or no joint swelling. No decreased range of motion. No back pain.  Psych:  No change in mood or affect. No depression or anxiety. No memory loss.  Neuro: no localizing neurological complaints, no tingling, no weakness, no double vision, no gait abnormality, no slurred speech, no confusion  All systems reviewed and apart from [place] all are  negative _______________________________________________________________________________________________ Past Medical History:   Past Medical History:  Diagnosis Date  . COPD (chronic obstructive pulmonary disease) ([place])   . Felty syndrome ([place])   . Hernia,  epigastric   . Pancytopenia ([place])   . Seropositive rheumatoid arthritis ([place])   . Tobacco use disorder       Past Surgical History:  Procedure Laterality Date  . INCISIONAL HERNIA REPAIR  01/17/2020   Procedure: HERNIA REPAIR INCISIONAL AND Silvestre Gunner;  Surgeon: Kinsinger, Arta Bruce, MD;  Location: [facility]S;  Service: General;;  . LAPAROTOMY N/A 01/17/2020   Procedure: EXPLORATORY LAPAROTOMY;  Surgeon: Kieth Brightly Arta Bruce, MD;  Location: [facility]S;  Service: General;  Laterality: N/A;    Social History:  Ambulatory   Independently     reports that he has been smoking cigarettes. He has been smoking about 1.00 pack per day. He has never used smokeless tobacco. He reports current drug use. Drug: Marijuana. He reports that he does not drink alcohol.    Family History:   Family History  Problem Relation Age of Onset  . CAD Brother   . COPD Brother    ______________________________________________________________________________________________ Allergies: Allergies  Allergen Reactions  . Levaquin [Levofloxacin In D5w] Other (See Comments)    Chest pain  . Methotrexate Other (See Comments)    Chest pain     Prior to Admission medications   Medication Sig Start Date End Date Taking? Authorizing Provider  acetaminophen (TYLENOL) 325 MG tablet Take 2 tablets (650 mg total) by mouth every 6 (six) hours. 01/24/20   Kinsinger, Arta Bruce, MD  albuterol (PROVENTIL) (2.5 MG/3ML) 0.083% nebulizer solution Take 3 mLs (2.5 mg total) by nebulization every 6 (six) hours as needed for wheezing or shortness of breath. 01/24/20   Kinsinger, Arta Bruce, MD  methocarbamol (ROBAXIN) 500 MG tablet Take 2 tablets (1,000 mg total) by mouth  every 8 (eight) hours. 01/24/20   Kinsinger, Arta Bruce, MD  mometasone-formoterol (DULERA) 200-5 MCG/ACT AERO Inhale 1 puff into the lungs 2 (two) times daily. 01/24/20   Kinsinger, Arta Bruce, MD  oxyCODONE (OXY IR/ROXICODONE) 5 MG immediate release tablet Take 1-2 tablets (5-10 mg total) by mouth every 4 (four) hours as needed for moderate pain or severe pain. 01/24/20   Kinsinger, Arta Bruce, MD  phenol (CHLORASEPTIC) 1.4 % LIQD Use as directed 1 spray in the mouth or throat as needed for throat irritation / pain.    [provider]    ___________________________________________________________________________________________________ Physical Exam: GYJEHU with BMI 06/10/2020 06/10/2020 06/10/2020  Height - - -  Weight - - -  BMI - - -  Systolic 314 970 263  Diastolic 87 92 99  Pulse 75 82 80    1. General:  in No  Acute distress   Chronically ill  -appearing 2. Psychological: Alert and   Oriented 3. Head/ENT:     Dry Mucous Membranes                          Head Non traumatic, neck supple                           Poor Dentition 4. SKIN:   decreased Skin turgor,  Skin clean Dry and intact redness over dorsum of right foot    5. Heart: Regular rate and rhythm no  Murmur, no Rub or gallop 6. Lungs:  Occasional  wheezes or crackles   7. Abdomen: Soft,  non-tender, Non distended  bowel sounds present 8. Lower extremities: no clubbing, cyanosis, no  edema 9. Neurologically Grossly intact, moving all 4 extremities equally  10. MSK: Normal  range of motion    Chart has been reviewed  ______________________________________________________________________________________________  Assessment/Plan  60 y.o. male with medical history significant of COPD, perforated prepyloric ulcer, Felty syndrome, pancytopenia, rheumatoid arthritis, tobacco abuse     Admitted for COPD exacerbation and mild cellulitis  Present on Admission: . COPD exacerbation ([place]) - Will initiate: Steroid  taper  -  Antibiotics Keflex - Albuterol  PRN, - scheduled duoneb,  -  Breo or Dulera at discharge   -  Mucinex.  Titrate O2 to saturation >90%. Follow patients respiratory status.  nfluenza PCR negative  VBG no significant hypercarbia  Currently mentating well no evidence of symptomatic hypercarbia  . Pancytopenia (HCC) -chronic.  Appears to be stable  . Acute hypoxemic respiratory failure (HCC) -  this patient has acute respiratory failure with Hypoxia  as documented by the presence of following: O2 saturatio< 90% on RA   Likely due to: COPD exacerbation,   Provide O2 therapy and titrate as needed  Continuous pulse ox  check Pulse ox with ambulation prior to discharge   may need  TC consult for home O2 set up   flutter valve ordered  . Chest pain -troponin unremarkable chest pain atypical and persistent. Obtain echogram Monitor on telemetry, pending further work-up may need outpatient cardiology follow-up Most likely cause of chest pain though being pulmonary  . Cellulitis -mild cellulitis of right foot.  Order cellulitis protocol check MRSA Keflex p.o.  [name] Chronic diastolic CHF (congestive heart failure) (HCC) current appears to be slightly on the dry side we will gently rehydrate and follow  Tobacco abuse -  - Spoke about importance of quitting spent 5 minutes discussing options for treatment, prior attempts at quitting, and dangers of smoking  -At this point patient is    interested in quitting  - order nicotine patch   - nursing tobacco cessation protocol  Other plan as per orders.  DVT prophylaxis:    Lovenox        Code Status:    Code Status: Prior FULL CODE  as per patient   I had personally discussed CODE STATUS with patient    Family Communication:   Family not at  Bedside    Disposition Plan:    To home once workup is complete and patient is stable    ollowing barriers for discharge:                            Electrolytes corrected                                                           Pain controlled with PO medications                               Afebrile, white count improving able to transition to PO antibiotics                                                         Will likely need home health, home O2, set up  Would benefit from PT/OT eval prior to DC  Ordered                                     Transition of care consulted                   Nutrition    consulted                  Consults called: none  Admission status:  ED Disposition    ED Disposition Condition Comment   Admit  The patient appears reasonably stabilized for admission considering the current resources, flow, and capabilities available in the ED at this time, and I doubt any other [facility] requiring further screening and/or treatment in the ED prior to admission is  present.          inpatient     I Expect 2 midnight stay secondary to severity of patient's current illness need for inpatient interventions justified by the following:  hemodynamic instability despite optimal treatment (  Tachypnea hypoxia, )   Severe lab/radiological/exam abnormalities including:    hypoxia and extensive comorbidities including:  COPD/asthma .    That are currently affecting medical management.   I expect  patient to be hospitalized for 2 midnights requiring inpatient medical care.  Patient is at high risk for adverse outcome (such as loss of life or disability) if not treated.  Indication for inpatient stay as follows:    Hemodynamic instability despite maximal medical therapy,     severe pain requiring acute inpatient management,       persistent chest pain despite medical management   New or worsening hypoxia  Need for IV antibiotics, IV fluids,     Level of care  Progressive  tele indefinitely please discontinue once patient no longer qualifies COVID-19 Labs    Lab Results  Component Value Date    [place] 06/10/2020     Precautions: admitted as  Covid Negative    PPE: Used by the provider:   N95  eye Goggles,  Gloves    Chaquetta Schlottman 06/11/2020, 12:55 AM    [facility]     after 2 AM please page floor coverage PA If 7AM-7PM, please contact the day team taking care of the patient using [email or web address]   Patient was evaluated in the context of the global COVID-19 pandemic, which necessitated consideration that the patient might be at risk for infection with the SARS-CoV-2 virus that causes COVID-19. Institutional protocols and algorithms that pertain to the evaluation of patients at risk for COVID-19 are in a state of rapid change based on information released by regulatory bodies including the CDC and federal and state organizations. These policies and algorithms were followed during the patient's care.

## 2020-06-11 NOTE — Progress Notes (Signed)
   06/11/20 0839  Assess: MEWS Score  Temp 97.6 F (36.4 C)  BP 134/83  Pulse Rate 68  Resp (!) 26  SpO2 94 %  O2 Device Nasal Cannula  O2 Flow Rate (L/min) 4 L/min  Assess: MEWS Score  MEWS Temp 0  MEWS Systolic 0  MEWS Pulse 0  MEWS RR 2  MEWS LOC 0  MEWS Score 2  MEWS Score Color Yellow  Assess: if the MEWS score is Yellow or Red  Were vital signs taken at a resting state? Yes  Focused Assessment No change from prior assessment  Early Detection of Sepsis Score *See Row Information* Low  MEWS guidelines implemented *See Row Information* Yes  Treat  Pain Scale 0-10  Pain Score 10  Pain Type Acute pain  Pain Location Abdomen  Pain Orientation Mid  Pain Descriptors / Indicators Aching;Constant  Pain Frequency Constant  Pain Onset On-going  Patients Stated Pain Goal 0  Pain Intervention(s) Medication (See eMAR)  Take Vital Signs  Increase Vital Sign Frequency  Yellow: Q 2hr X 2 then Q 4hr X 2, if remains yellow, continue Q 4hrs  Escalate  MEWS: Escalate Yellow: discuss with charge nurse/RN and consider discussing with provider and RRT  Notify: Charge Nurse/RN  Name of Charge Nurse/RN Notified Sharyn Blitz  Date Charge Nurse/RN Notified 06/11/20  Time Charge Nurse/RN Notified 0845  Document  Progress note created (see row info) Yes

## 2020-06-12 DIAGNOSIS — J9601 Acute respiratory failure with hypoxia: Secondary | ICD-10-CM | POA: Diagnosis not present

## 2020-06-12 LAB — COMPREHENSIVE METABOLIC PANEL
ALT: 14 U/L (ref 0–44)
AST: 23 U/L (ref 15–41)
Albumin: 2.6 g/dL — ABNORMAL LOW (ref 3.5–5.0)
Alkaline Phosphatase: 102 U/L (ref 38–126)
Anion gap: 9 (ref 5–15)
BUN: 20 mg/dL (ref 6–20)
CO2: 24 mmol/L (ref 22–32)
Calcium: 8.4 mg/dL — ABNORMAL LOW (ref 8.9–10.3)
Chloride: 104 mmol/L (ref 98–111)
Creatinine, Ser: 0.6 mg/dL — ABNORMAL LOW (ref 0.61–1.24)
GFR, Estimated: >60 mL/min (ref >60)
Glucose, Bld: 158 mg/dL — ABNORMAL HIGH (ref 70–99)
Potassium: 4.2 mmol/L (ref 3.5–5.1)
Sodium: 137 mmol/L (ref 135–145)
Total Bilirubin: 0.9 mg/dL (ref 0.3–1.2)
Total Protein: 7.5 g/dL (ref 6.5–8.1)

## 2020-06-12 LAB — CBC
HCT: 47.2 % (ref 39.0–52.0)
Hemoglobin: 14.9 g/dL (ref 13.0–17.0)
MCH: 27.2 pg (ref 26.0–34.0)
MCHC: 31.6 g/dL (ref 30.0–36.0)
MCV: 86.1 fL (ref 80.0–100.0)
Platelets: 165 10*3/uL (ref 150–400)
RBC: 5.48 MIL/uL (ref 4.22–5.81)
RDW: 17.9 % — ABNORMAL HIGH (ref 11.5–15.5)
WBC: 3.1 10*3/uL — ABNORMAL LOW (ref 4.0–10.5)
nRBC: 2.6 % — ABNORMAL HIGH (ref 0.0–0.2)

## 2020-06-12 MED ORDER — CEPHALEXIN 500 MG PO CAPS: 500.0000 mg | ORAL_CAPSULE | Freq: Four times a day (QID) | ORAL | 0 refills | Status: AC

## 2020-06-12 MED ORDER — MOMETASONE FURO-FORMOTEROL FUM 100-5 MCG/ACT IN AERO: 2.0000 | INHALATION_SPRAY | Freq: Two times a day (BID) | RESPIRATORY_TRACT | Status: DC

## 2020-06-12 MED ORDER — IPRATROPIUM-ALBUTEROL 0.5-2.5 (3) MG/3ML IN SOLN: 3.0000 mL | Freq: Two times a day (BID) | RESPIRATORY_TRACT | Status: DC

## 2020-06-12 MED ORDER — PREDNISONE 20 MG PO TABS: ORAL_TABLET | ORAL | 0 refills | Status: DC

## 2020-06-12 NOTE — Progress Notes (Signed)
RN paged Dr. [place] Pink about [facility] clarification as pt had no scripts provided for discharge, but he does have the inhaler he had been given here to use while inpatient. MD reports to send inhaler home with him that he was using here and that will last him until he follows up with his PCP. Pt notified.

## 2020-06-12 NOTE — Progress Notes (Signed)
NUTRITION NOTE  Consult received for assessment of nutrition requirements and status.   Discharge order and discharge summary entered this AM. RN notes indicate discharge paper work has been reviewed with patient and he is awaiting his brother picking him up.   If patient is unable to d/c, RD will complete assessment next date.       Jarome Matin, MS, RD, LDN, CNSC Inpatient Clinical Dietitian RD pager # available in [place]  After hours/weekend pager # available in [facility]

## 2020-06-12 NOTE — TOC Initial Note (Signed)
Transition of Care [facility]) - Initial/Assessment Note    Patient Details  Name: Marcus [name] MRN: [id number] Date of Birth: Sep 06, 1960  Transition of Care (TOC) CM/SW Contact:    Joaquin Courts, RN Phone Number: 06/12/2020, 10:18 AM  Clinical Narrative:                 Patient reports he used to have O2 at home, but returned equipment to dme agency in October.  New O2 referral given to [employer] rep Zach.  Patient reports brother will pick him up from hospital at dc.  No further TOC needs identified.  Expected Discharge Plan: Home/Self Care Barriers to Discharge: No Barriers Identified   Patient Goals and CMS Choice Patient states their goals for this hospitalization and ongoing recovery are:: to go home      Expected Discharge Plan and Services Expected Discharge Plan: Home/Self Care   Discharge Planning Services: CM Consult   Living arrangements for the past 2 months: Single Family Home Expected Discharge Date: 06/12/20               DME Arranged: Oxygen DME Agency: [employer] Date DME Agency Contacted: 06/12/20 Time DME Agency Contacted: 64 Representative spoke with at DME Agency: Thedore Mins            Prior Living Arrangements/Services Living arrangements for the past 2 months: [place] Lives with:: Parents Patient language and need for interpreter reviewed:: Yes Do you feel safe going back to the place where you live?: Yes      Need for Family Participation in Patient Care: Yes (Comment) Care giver support system in place?: Yes (comment)   Criminal Activity/Legal Involvement Pertinent to Current Situation/Hospitalization: No - Comment as needed  Activities of Daily Living Home Assistive Devices/Equipment: Cane (specify quad or straight),Walker (specify type),Nebulizer,Other (Comment) (front wheeled walker, singlepoint cane, tub/shower unit, standard height toilet) ADL Screening (condition at time of admission) Patient's cognitive ability adequate to  safely complete daily activities?: Yes Is the patient deaf or have difficulty hearing?: No Does the patient have difficulty seeing, even when wearing glasses/contacts?: No Does the patient have difficulty concentrating, remembering, or making decisions?: No Patient able to express need for assistance with ADLs?: Yes Does the patient have difficulty dressing or bathing?: No Independently performs ADLs?: Yes (appropriate for developmental age) Does the patient have difficulty walking or climbing stairs?: Yes (secondary to shortness of breath) Weakness of Legs: None Weakness of Arms/Hands: None  Permission Sought/Granted                  Emotional Assessment Appearance:: Appears stated age Attitude/Demeanor/Rapport: Engaged Affect (typically observed): Accepting Orientation: : Oriented to Self,Oriented to Place,Oriented to  Time,Oriented to Situation   Psych Involvement: No (comment)  Admission diagnosis:  Cellulitis of foot [L03.119] COPD exacerbation (HCC) [J44.1] Acute respiratory failure with hypoxia ([place]) [J96.01] COPD with acute exacerbation ([place]) [J44.1] Patient Active Problem List   Diagnosis Date Noted  . COPD exacerbation ([place]) 06/11/2020  . Chest pain 06/11/2020  . Cellulitis 06/11/2020  . Chronic diastolic CHF (congestive heart failure) ([place]) 06/11/2020  . Severe sepsis with septic shock ([place]) 01/20/2020  . Acute hypoxemic respiratory failure ([place]) 01/20/2020  . Hypokalemia 01/20/2020  . Perforated prepyloric ulcer 01/18/2020 01/18/2020  . Pneumoperitoneum 01/17/2020  . COPD with acute exacerbation ([place]) 08/18/2019  . 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 Anemia 11/14/2015  . Seropositive rheumatoid arthritis ([place])   . Chronic obstructive pulmonary disease ([place])   . Protein-calorie malnutrition, severe  07/24/2015  . Pancytopenia ([place]) 07/23/2015  . 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 Leukopenia 10/21/2012  . 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 Abnormal EKG 10/21/2012   . Joint pain 10/21/2012  . Splenomegaly 10/21/2012   PCP:  Pamella Pert, MD Pharmacy:   [id number] - [place], [place] - [address] [place] [address] Phone: [phone] Fax: [phone]     Social Determinants of Health (SDOH) Interventions    Readmission Risk Interventions No flowsheet data found.

## 2020-06-12 NOTE — Progress Notes (Signed)
SATURATION QUALIFICATIONS: (This note is used to comply with regulatory documentation for home oxygen)  Patient Saturations on Room Air at Rest = 91%  Patient Saturations on Room Air while Ambulating =87%  Patient Saturations on 2 Liters of oxygen while Ambulating = 93%   

## 2020-06-12 NOTE — Discharge Summary (Signed)
[facility]  Physician Discharge Summary   Patient ID: Marcus [name] MRN: [id number] DOB/AGE: 11-24-1960 60 y.o.  Admit date: 06/10/2020 Discharge date: 06/12/2020  PCP: Pamella Pert, MD  DISCHARGE DIAGNOSES:  COPD with acute exacerbation Acute respiratory failure with hypoxia Chronic cor pulmonale Chronic diastolic CHF Right foot cellulitis Leukopenia  RECOMMENDATIONS FOR OUTPATIENT FOLLOW UP: 1. Patient instructed to follow-up with PCP and to have his F64 and folic acid levels checked at follow-up.  Patient to find a new PCP in the [place] area.    Home Health: None Equipment/Devices: Home oxygen  CODE STATUS: Full code  DISCHARGE CONDITION: fair  Diet recommendation: As before  INITIAL HISTORY: 60 y.o.malewith medical history significant of COPD, perforated prepyloric ulcer, Felty syndrome, pancytopenia, rheumatoid arthritis, tobacco abuse, chronic diastolic CHF, cor pulmonale who presented with body aches chest pain abdominal pain ongoing for a week.  Patient underwent CT scan of the chest abdomen and pelvis which did not reveal any acute findings.  He was hospitalized due to concern for COPD exacerbation.  He has new oxygen requirements.   HOSPITAL COURSE:   Acute respiratory failure with hypoxia/COPD with acute exacerbation Patient with new oxygen requirements.    Initially was requiring 3 L of oxygen saturating in the mid 90s.  Patient was noted to be wheezing in the ED.  CT angiogram did not show any PE.  Emphysematous changes were noted without any acute infiltrates.  Patient was started on nebulizer treatments and steroids.  Also given antibiotics.  He is respiratory status has improved.  However he still is noted to desaturate with ambulation so he will need home oxygen at discharge.   Patient feels much better and wants to go home today.    Chest pain/cor pulmonale Troponin was negative.  EKG is abnormal with T wave inversions but is similar to  previous.  Echocardiogram done in October 2021 showed normal systolic function with the patient was noted to have depressed RV function and was seen by cardiology then.  He is known to have chronic cor pulmonale.  Supposed to see pulmonology but has not done so yet.  Echocardiogram was repeated and shows normal systolic function of the left ventricle without any wall motion abnormalities.  Compared to the echocardiogram from 2021 the current echocardiogram shows improvement in the RV function.  We will send ambulatory referral to pulmonology.  Chest pain thought to be secondary to pulmonary issues.  Troponins were normal.  No further cardiac testing in the hospital.  Leukopenia Stable.  Patient told to have his P32 and folic acid levels checked at follow-up with PCP.  Right foot cellulitis This is mild over the dorsal aspect.    Improving with Keflex.  Chronic diastolic CHF Stable.  No evidence for acute decompensation.  Hyponatremia Likely due to hypovolemia.  Now resolved.  Tobacco abuse Counseled.  Patient is stable.  Wants to go home today.  Okay for discharge.   PERTINENT LABS:  The results of significant diagnostics from this hospitalization (including imaging, microbiology, ancillary and laboratory) are listed below for reference.    Microbiology: Recent Results (from the past 240 hour(s))  Resp Panel by RT-PCR (Flu A&B, Covid) Nasopharyngeal Swab     Status: None   Collection Time: 06/10/20  7:58 PM   Specimen: Nasopharyngeal Swab; Nasopharyngeal(NP) swabs in vial transport medium  Result Value Ref Range Status   SARS Coronavirus 2 by RT PCR NEGATIVE NEGATIVE Final    Comment: (NOTE) SARS-CoV-2 target nucleic acids  are NOT DETECTED.  The SARS-CoV-2 RNA is generally detectable in upper respiratory specimens during the acute phase of infection. The lowest concentration of SARS-CoV-2 viral copies this assay can detect is 138 copies/mL. A negative result does not  preclude SARS-Cov-2 infection and should not be used as the sole basis for treatment or other patient management decisions. A negative result may occur with  improper specimen collection/handling, submission of specimen other than nasopharyngeal swab, presence of viral mutation(s) within the areas targeted by this assay, and inadequate number of viral copies(<138 copies/mL). A negative result must be combined with clinical observations, patient history, and epidemiological information. The expected result is Negative.  Fact Sheet for Patients:  EntrepreneurPulse.com.au  Fact Sheet for Healthcare Providers:  IncredibleEmployment.be  This test is no t yet approved or cleared by the [place] FDA and  has been authorized for detection and/or diagnosis of SARS-CoV-2 by FDA under an Emergency Use Authorization (EUA). This EUA will remain  in effect (meaning this test can be used) for the duration of the COVID-19 declaration under Section 564(b)(1) of the Act, 21 U.S.C.section 360bbb-3(b)(1), unless the authorization is terminated  or revoked sooner.       Influenza A by PCR NEGATIVE NEGATIVE Final   Influenza B by PCR NEGATIVE NEGATIVE Final    Comment: (NOTE) The Xpert Xpress SARS-CoV-2/FLU/RSV plus assay is intended as an aid in the diagnosis of influenza from Nasopharyngeal swab specimens and should not be used as a sole basis for treatment. Nasal washings and aspirates are unacceptable for Xpert Xpress SARS-CoV-2/FLU/RSV testing.  Fact Sheet for Patients: EntrepreneurPulse.com.au  Fact Sheet for Healthcare Providers: IncredibleEmployment.be  This test is not yet approved or cleared by the [place] FDA and has been authorized for detection and/or diagnosis of SARS-CoV-2 by FDA under an Emergency Use Authorization (EUA). This EUA will remain in effect (meaning this test can be used) for the  duration of the COVID-19 declaration under Section 564(b)(1) of the Act, 21 U.S.C. section 360bbb-3(b)(1), unless the authorization is terminated or revoked.  Performed at [facility], [place] [address], [place], [place] [address]   Culture, blood (routine x 2)     Status: None (Preliminary result)   Collection Time: 06/11/20 12:38 AM   Specimen: BLOOD  Result Value Ref Range Status   Specimen Description   Final    BLOOD RIGHT ANTECUBITAL Performed at [place] [address], [place], [place] [address]    Special Requests   Final    BOTTLES DRAWN AEROBIC AND ANAEROBIC Blood Culture adequate volume Performed at [place] [address], [place], [place] [address]    Culture   Final    NO GROWTH < 12 HOURS Performed at [place] [address] [place], [place] [address]    Report Status PENDING  Incomplete  Culture, blood (routine x 2)     Status: None (Preliminary result)   Collection Time: 06/11/20 12:41 AM   Specimen: BLOOD  Result Value Ref Range Status   Specimen Description   Final    BLOOD RIGHT ANTECUBITAL Performed at [place] [address] [place], [place] [address]    Special Requests   Final    BOTTLES DRAWN AEROBIC AND ANAEROBIC Blood Culture adequate volume Performed at [facility] [address] [place], [place] [address]    Culture   Final    NO GROWTH < 12 HOURS Performed at [facility], [place] [address] [place], [place] [address]    Report Status PENDING  Incomplete  MRSA PCR Screening     Status: None   Collection Time: 06/11/20  4:57 AM   Specimen: Nasal Mucosa; Nasopharyngeal  Result Value Ref Range Status   MRSA by PCR NEGATIVE NEGATIVE Final    Comment:        The GeneXpert MRSA Assay (FDA approved for NASAL specimens only), is one component of a comprehensive MRSA colonization surveillance program. It is  not intended to diagnose MRSA infection nor to guide or monitor treatment for MRSA infections. Performed at [facility], [place] [address], [place], [place] [address]   Respiratory (~20 pathogens) panel by PCR     Status: None   Collection Time: 06/11/20  6:17 AM  Result Value Ref Range Status   Adenovirus NOT DETECTED NOT DETECTED Final   Coronavirus 229E NOT DETECTED NOT DETECTED Final    Comment: (NOTE) The Coronavirus on the Respiratory Panel, DOES NOT test for the novel  Coronavirus (2019 nCoV)    Coronavirus HKU1 NOT DETECTED NOT DETECTED Final   Coronavirus NL63 NOT DETECTED NOT DETECTED Final   Coronavirus OC43 NOT DETECTED NOT DETECTED Final   Metapneumovirus NOT DETECTED NOT DETECTED Final   Rhinovirus / Enterovirus NOT DETECTED NOT DETECTED Final   Influenza A NOT DETECTED NOT DETECTED Final   Influenza B NOT DETECTED NOT DETECTED Final   Parainfluenza Virus 1 NOT DETECTED NOT DETECTED Final   Parainfluenza Virus 2 NOT DETECTED NOT DETECTED Final   Parainfluenza Virus 3 NOT DETECTED NOT DETECTED Final   Parainfluenza Virus 4 NOT DETECTED NOT DETECTED Final   Respiratory Syncytial Virus NOT DETECTED NOT DETECTED Final   Bordetella pertussis NOT DETECTED NOT DETECTED Final   Bordetella Parapertussis NOT DETECTED NOT DETECTED Final   Chlamydophila pneumoniae NOT DETECTED NOT DETECTED Final   Mycoplasma pneumoniae NOT DETECTED NOT DETECTED Final    Comment: Performed at [facility], [place]. [address], [place], [place] [address]     Labs:  COVID-19 Labs   Lab Results  Component Value Date   SARSCOV2NAA NEGATIVE 06/10/2020   [place] NEGATIVE 01/17/2020   [place] NEGATIVE 11/30/2019   [place] NEGATIVE 08/18/2019      Basic Metabolic Panel: Recent Labs  Lab 06/10/20 2017 06/11/20 0015 06/11/20 0457 06/12/20 0420  NA 134*  --  133* 137  K 3.7  --  4.3 4.2  CL 99  --  101 104  CO2 27  --  25 24  GLUCOSE 82  --  150* 158*   BUN 14  --  11 20  CREATININE 0.63  --  0.58* 0.60*  CALCIUM 8.3*  --  8.5* 8.4*  MG  --  2.2 2.2  --   PHOS  --   --  3.8  --    Liver Function Tests: Recent Labs  Lab 06/10/20 2017 06/11/20 0457 06/12/20 0420  AST 39 40 23  ALT 16 16 14   ALKPHOS 119 129* 102  BILITOT 0.7 0.7 0.9  PROT 8.1 8.4* 7.5  ALBUMIN 2.8* 2.8* 2.6*   Recent Labs  Lab 06/10/20 2017  LIPASE 67*   CBC: Recent Labs  Lab 06/10/20 2017 06/11/20 0457 06/12/20 0420  WBC 3.9* 3.2* 3.1*  NEUTROABS  --  0.2*  --   HGB 15.8 16.0 14.9  HCT 50.3 50.8 47.2  MCV 87.2 87.0 86.1  PLT 149* 151 165  IMAGING STUDIES DG Chest 2 View  Result Date: 06/10/2020 CLINICAL DATA:  Chest pain. EXAM: CHEST - 2 VIEW COMPARISON:  January 22, 2020 FINDINGS: There is no evidence of an acute infiltrate. A very small right pleural effusion is seen. No pneumothorax is identified. The heart size and mediastinal contours are within normal limits. Chronic sixth and seventh right rib fractures are seen. IMPRESSION: Very small right pleural effusion without evidence of acute infiltrate. Electronically Signed   By: Virgina Norfolk M.D.   On: 06/10/2020 20:25   CT Angio Chest PE W and/or Wo Contrast  Result Date: 06/10/2020 CLINICAL DATA:  Chest and abdominal pain EXAM: CT ANGIOGRAPHY CHEST CT ABDOMEN AND PELVIS WITH CONTRAST TECHNIQUE: Multidetector CT imaging of the chest was performed using the standard protocol during bolus administration of intravenous contrast. Multiplanar CT image reconstructions and MIPs were obtained to evaluate the vascular anatomy. Multidetector CT imaging of the abdomen and pelvis was performed using the standard protocol during bolus administration of intravenous contrast. CONTRAST:  134mL OMNIPAQUE IOHEXOL 350 MG/ML SOLN COMPARISON:  01/17/2020 FINDINGS: CTA CHEST FINDINGS Cardiovascular: Thoracic aorta demonstrates atherosclerotic calcification without aneurysmal dilatation or dissection. Pulmonary artery  shows a normal branching pattern bilaterally. No filling defects to suggest pulmonary emboli are noted. Coronary calcifications are seen. No significant cardiac enlargement is noted. Mediastinum/Nodes: Thoracic inlet is within normal limits. No hilar or mediastinal adenopathy is seen. The esophagus as visualized is within normal limits. Lungs/Pleura: Diffuse emphysematous changes are identified throughout both lungs. No focal infiltrate or sizable effusion is seen. Previously noted inflammatory changes have resolved in the interval. No sizable parenchymal nodules are seen. Musculoskeletal: Mild degenerative changes of the thoracic spine are seen. 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 No acute rib abnormality is seen. Review of the MIP images confirms the above findings. CT ABDOMEN and PELVIS FINDINGS Hepatobiliary: Liver is within normal limits. Gallbladder is decompressed. Partially calcified gallstones are seen within the gallbladder without complicating factors. Pancreas: Unremarkable. No pancreatic ductal dilatation or surrounding inflammatory changes. Spleen: Spleen has been surgically removed. Adrenals/Urinary Tract: Adrenal glands are within normal limits. Kidneys show a normal enhancement pattern bilaterally. No renal calculi or obstructive changes are seen. The bladder is partially distended. Stomach/Bowel: The appendix is within normal limits. No obstructive or inflammatory changes of the colon are seen. Small bowel shows no obstructive change. Herniated loop of small bowel in the right inguinal hernia is seen without evidence of incarceration or obstructive change. Stomach is within normal limits. Vascular/Lymphatic: Aortic atherosclerosis. No enlarged abdominal or pelvic lymph nodes. Reproductive: Prostate is unremarkable. Other: Right inguinal hernia containing a loop of small bowel without incarceration. Previously seen ventral hernia has been repaired. No recurrent herniation  anteriorly is noted. No free fluid is seen. Musculoskeletal: No acute or significant osseous findings. Review of the MIP images confirms the above findings. IMPRESSION: CTA of the chest: No evidence of pulmonary emboli. Emphysematous changes without acute infiltrate. CT of the abdomen and pelvis: Repair of previously seen ventral hernia. Right inguinal hernia containing a loop of small bowel is seen without evidence of incarceration. Cholelithiasis without complicating factors. Changes of prior splenectomy. Aortic Atherosclerosis (ICD10-I70.0) and Emphysema (ICD10-J43.9). Electronically Signed   By: Inez Catalina M.D.   On: 06/10/2020 23:39   CT ABDOMEN PELVIS W CONTRAST  Result Date: 06/10/2020 CLINICAL DATA:  Chest and abdominal pain EXAM: CT ANGIOGRAPHY CHEST CT ABDOMEN AND PELVIS WITH CONTRAST TECHNIQUE: Multidetector CT imaging of the chest was performed  using the standard protocol during bolus administration of intravenous contrast. Multiplanar CT image reconstructions and MIPs were obtained to evaluate the vascular anatomy. Multidetector CT imaging of the abdomen and pelvis was performed using the standard protocol during bolus administration of intravenous contrast. CONTRAST:  150mL OMNIPAQUE IOHEXOL 350 MG/ML SOLN COMPARISON:  01/17/2020 FINDINGS: CTA CHEST FINDINGS Cardiovascular: Thoracic aorta demonstrates atherosclerotic calcification without aneurysmal dilatation or dissection. Pulmonary artery shows a normal branching pattern bilaterally. No filling defects to suggest pulmonary emboli are noted. Coronary calcifications are seen. No significant cardiac enlargement is noted. Mediastinum/Nodes: Thoracic inlet is within normal limits. No hilar or mediastinal adenopathy is seen. The esophagus as visualized is within normal limits. Lungs/Pleura: Diffuse emphysematous changes are identified throughout both lungs. No focal infiltrate or sizable effusion is seen. Previously noted inflammatory changes have  resolved in the interval. No sizable parenchymal nodules are seen. Musculoskeletal: Mild degenerative changes of the thoracic spine are seen. 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 No acute rib abnormality is seen. Review of the MIP images confirms the above findings. CT ABDOMEN and PELVIS FINDINGS Hepatobiliary: Liver is within normal limits. Gallbladder is decompressed. Partially calcified gallstones are seen within the gallbladder without complicating factors. Pancreas: Unremarkable. No pancreatic ductal dilatation or surrounding inflammatory changes. Spleen: Spleen has been surgically removed. Adrenals/Urinary Tract: Adrenal glands are within normal limits. Kidneys show a normal enhancement pattern bilaterally. No renal calculi or obstructive changes are seen. The bladder is partially distended. Stomach/Bowel: The appendix is within normal limits. No obstructive or inflammatory changes of the colon are seen. Small bowel shows no obstructive change. Herniated loop of small bowel in the right inguinal hernia is seen without evidence of incarceration or obstructive change. Stomach is within normal limits. Vascular/Lymphatic: Aortic atherosclerosis. No enlarged abdominal or pelvic lymph nodes. Reproductive: Prostate is unremarkable. Other: Right inguinal hernia containing a loop of small bowel without incarceration. Previously seen ventral hernia has been repaired. No recurrent herniation anteriorly is noted. No free fluid is seen. Musculoskeletal: No acute or significant osseous findings. Review of the MIP images confirms the above findings. IMPRESSION: CTA of the chest: No evidence of pulmonary emboli. Emphysematous changes without acute infiltrate. CT of the abdomen and pelvis: Repair of previously seen ventral hernia. Right inguinal hernia containing a loop of small bowel is seen without evidence of incarceration. Cholelithiasis without complicating factors. Changes of prior splenectomy. Aortic  Atherosclerosis (ICD10-I70.0) and Emphysema (ICD10-J43.9). Electronically Signed   By: Inez Catalina M.D.   On: 06/10/2020 23:39   DG Abd Portable 1 View  Result Date: 06/10/2020 CLINICAL DATA:  Abdominal pain x5 months. EXAM: PORTABLE ABDOMEN - 1 VIEW COMPARISON:  January 19, 2020 FINDINGS: The bowel gas pattern is normal. No radio-opaque calculi or other significant radiographic abnormality are seen. IMPRESSION: Negative. Electronically Signed   By: Virgina Norfolk M.D.   On: 06/10/2020 22:18   ECHOCARDIOGRAM COMPLETE  Result Date: 06/11/2020    ECHOCARDIOGRAM REPORT   Patient Name:   Marcus [name] [facility] Date of Exam: 06/11/2020 Medical Rec #:  [id number]    Height:       70.0 in Accession #:    [id number]   Weight:       172.0 lb Date of Birth:  July 01, 1960     BSA:          1.958 m Patient Age:    35 years     BP:           132/78 mmHg  Patient Gender: M            HR:           73 bpm. Exam Location:  Inpatient Procedure: 2D Echo Indications:    Chest Pain R07.9  History:        Patient has prior history of Echocardiogram examinations, most                 recent 01/18/2020. COPD; Risk Factors:Current Smoker.  Sonographer:    Mikki Santee RDCS (AE) Referring Phys: [place]  1. Left ventricular ejection fraction, by estimation, is 60 to 65%. The left ventricle has normal function. The left ventricle has no regional wall motion abnormalities. Left ventricular diastolic parameters are consistent with Grade I diastolic dysfunction (impaired relaxation).  2. Right ventricular systolic function is mildly reduced. The right ventricular size is moderately enlarged. Tricuspid regurgitation signal is inadequate for assessing PA pressure.  3. The mitral valve is normal in structure. Trivial mitral valve regurgitation.  4. The aortic valve was not well visualized. Aortic valve regurgitation is not visualized. No aortic stenosis is present.  5. The inferior vena cava is dilated in size with <50%  respiratory variability, suggesting right atrial pressure of 15 mmHg. Comparison(s): Compared to prior exam on 01/18/20, the RV now appears moderately dilated (was previously severely dilated) with improved systolic function. RAP is ~50mmHg. FINDINGS  Left Ventricle: Left ventricular ejection fraction, by estimation, is 60 to 65%. The left ventricle has normal function. The left ventricle has no regional wall motion abnormalities. The left ventricular internal cavity size was normal in size. There is  no left ventricular hypertrophy. Left ventricular diastolic parameters are consistent with Grade I diastolic dysfunction (impaired relaxation). Right Ventricle: The right ventricular size is moderately enlarged. No increase in right ventricular wall thickness. Right ventricular systolic function is mildly reduced. Tricuspid regurgitation signal is inadequate for assessing PA pressure. Left Atrium: Left atrial size was normal in size. Right Atrium: Right atrial size was normal in size. Pericardium: There is no evidence of pericardial effusion. Mitral Valve: The mitral valve is normal in structure. Trivial mitral valve regurgitation. Tricuspid Valve: The tricuspid valve is normal in structure. Tricuspid valve regurgitation is not demonstrated. Aortic Valve: The aortic valve was not well visualized. Aortic valve regurgitation is not visualized. No aortic stenosis is present. Pulmonic Valve: The pulmonic valve was not well visualized. Pulmonic valve regurgitation is trivial. Aorta: The aortic root is normal in size and structure. Venous: The inferior vena cava is dilated in size with less than 50% respiratory variability, suggesting right atrial pressure of 15 mmHg. IAS/Shunts: No atrial level shunt detected by color flow Doppler.  LEFT VENTRICLE PLAX 2D LVIDd:         4.90 cm  Diastology LVIDs:         3.50 cm  LV e' medial:    8.92 cm/s LV PW:         1.00 cm  LV E/e' medial:  7.4 LV IVS:        0.85 cm  LV e' lateral:    11.20 cm/s LVOT diam:     2.20 cm  LV E/e' lateral: 5.9 LV SV:         81 LV SV Index:   41 LVOT Area:     3.80 cm  RIGHT VENTRICLE RV S prime:     9.68 cm/s TAPSE (M-mode): 1.6 cm LEFT ATRIUM  Index       RIGHT ATRIUM           Index LA diam:        3.50 cm 1.79 cm/m  RA Area:     17.40 cm LA Vol (A2C):   42.2 ml 21.56 ml/m RA Volume:   50.60 ml  25.85 ml/m LA Vol (A4C):   39.4 ml 20.13 ml/m LA Biplane Vol: 41.2 ml 21.04 ml/m  AORTIC VALVE LVOT Vmax:   101.00 cm/s LVOT Vmean:  67.200 cm/s LVOT VTI:    0.212 m  AORTA Ao Root diam: 3.30 cm MITRAL VALVE MV Area (PHT): 2.11 cm    SHUNTS MV Decel Time: 359 msec    Systemic VTI:  0.21 m MV E velocity: 65.60 cm/s  Systemic Diam: 2.20 cm MV A velocity: 84.00 cm/s MV E/A ratio:  0.78 Gwyndolyn Kaufman MD Electronically signed by Gwyndolyn Kaufman MD Signature Date/Time: 06/11/2020/2:30:43 PM    Final     DISCHARGE EXAMINATION: Vitals:   06/12/20 0559 06/12/20 0911 06/12/20 1200 06/12/20 1255  BP: 129/77   (!) 133/92  Pulse: 61   60  Resp: 20     Temp: 97.6 F (36.4 C)   (!) 97.5 F (36.4 C)  TempSrc: Oral   Oral  SpO2: 91% (!) 1% 95%   Weight:      Height:       General appearance: Awake alert.  In no distress Resp: Clear to auscultation bilaterally.  Normal effort Cardio: S1-S2 is normal regular.  No S3-S4.  No rubs murmurs or bruit GI: Abdomen is soft.  Nontender nondistended.  Bowel sounds are present normal.  No masses organomegaly    DISPOSITION: Home  Discharge Instructions    Ambulatory referral to Pulmonology   Complete by: As directed    Also has cor pulmonale.   Reason for referral: Asthma/COPD   Call MD for:  difficulty breathing, headache or visual disturbances   Complete by: As directed    Call MD for:  extreme fatigue   Complete by: As directed    Call MD for:  persistant dizziness or light-headedness   Complete by: As directed    Call MD for:  persistant nausea and vomiting   Complete by: As directed     Call MD for:  severe uncontrolled pain   Complete by: As directed    Call MD for:  temperature >100.4   Complete by: As directed    Diet - low sodium heart healthy   Complete by: As directed    Discharge instructions   Complete by: As directed    Please follow-up with your primary care provider in 1 to 2 weeks.  Take your medications as prescribed.  You were cared for by a hospitalist during your hospital stay. If you have any questions about your discharge medications or the care you received while you were in the hospital after you are discharged, you can call the unit and asked to speak with the hospitalist on call if the hospitalist that took care of you is not available. Once you are discharged, your primary care physician will handle any further medical issues. Please note that NO REFILLS for any discharge medications will be authorized once you are discharged, as it is imperative that you return to your primary care physician (or establish a relationship with a primary care physician if you do not have one) for your aftercare needs so that they can reassess your need for medications and  monitor your lab values. If you do not have a primary care physician, you can call [phone] for a physician referral.   Increase activity slowly   Complete by: As directed         Allergies as of 06/12/2020      Reactions   Levaquin [levofloxacin In D5w] Other (See Comments)   Chest pain   Methotrexate Other (See Comments)   Chest pain      Medication List    STOP taking these medications   acetaminophen 325 MG tablet Commonly known as: TYLENOL   methocarbamol 500 MG tablet Commonly known as: ROBAXIN   mometasone-formoterol 200-5 MCG/ACT Aero Commonly known as: DULERA Replaced by: mometasone-formoterol 100-5 MCG/ACT Aero   oxyCODONE 5 MG immediate release tablet Commonly known as: Oxy IR/ROXICODONE     TAKE these medications   albuterol (2.5 MG/3ML) 0.083% nebulizer solution Commonly  known as: PROVENTIL Take 3 mLs (2.5 mg total) by nebulization every 6 (six) hours as needed for wheezing or shortness of breath.   cephALEXin 500 MG capsule Commonly known as: KEFLEX Take 1 capsule (500 mg total) by mouth every 6 (six) hours for 5 days.   mometasone-formoterol 100-5 MCG/ACT Aero Commonly known as: DULERA Inhale 2 puffs into the lungs 2 (two) times daily. Replaces: mometasone-formoterol 200-5 MCG/ACT Aero   predniSONE 20 MG tablet Commonly known as: DELTASONE Take 3 tablets once daily for 3 days followed by 2 tablets once daily for 3 days followed by 1 tablet once daily for 3 days and then stop            Durable Medical Equipment  (From admission, onward)         Start     Ordered   06/12/20 0907  For home use only DME oxygen  Once       Question Answer Comment  Length of Need 6 Months   Mode or (Route) Nasal cannula   Liters per Minute 2   Oxygen conserving device Yes   Oxygen delivery system Gas      06/12/20 0906            Follow-up Information    Pamella Pert, MD. Schedule an appointment as soon as possible for a visit in 1 week(s).   Specialty: Internal Medicine Contact information: [address] [address] [place], [place] [facility] Follow up.   Why: agency will provide home oxygen. Contact information: [address] [place] [address] [phone]               TOTAL DISCHARGE TIME: 31 minutes  [place]  [facility] Pager on [email or web address]  06/12/2020, 1:08 PM

## 2020-06-12 NOTE — Discharge Instructions (Signed)
Chronic Obstructive Pulmonary Disease  Chronic obstructive pulmonary disease (COPD) is a long-term (chronic) lung problem. When you have COPD, it is hard for air to get in and out of your lungs. Usually the condition gets worse over time, and your lungs will never return to normal. There are things you can do to keep yourself as healthy as possible. What are the causes?  Smoking. This is the most common cause.  Certain genes passed from parent to child (inherited). What increases the risk?  Being exposed to secondhand smoke from cigarettes, pipes, or cigars.  Being exposed to chemicals and other irritants, such as fumes and dust in the work environment.  Having chronic lung conditions or infections. What are the signs or symptoms?  Shortness of breath, especially during physical activity.  A long-term cough with a large amount of thick mucus. Sometimes, the cough may not have any mucus (dry cough).  Wheezing.  Breathing quickly.  Skin that looks gray or blue, especially in the fingers, toes, or lips.  Feeling tired (fatigue).  Weight loss.  Chest tightness.  Having infections often.  Episodes when breathing symptoms become much worse (exacerbations). At the later stages of this disease, you may have swelling in the ankles, feet, or legs. How is this treated?  Taking medicines.  Quitting smoking, if you smoke.  Rehabilitation. This includes steps to make your body work better. It may involve a team of specialists.  Doing exercises.  Making changes to your diet.  Using oxygen.  Lung surgery.  Lung transplant.  Comfort measures (palliative care). Follow these instructions at home: Medicines  Take over-the-counter and prescription medicines only as told by your doctor.  Talk to your doctor before taking any cough or allergy medicines. You may need to avoid medicines that cause your lungs to be dry. Lifestyle  If you smoke, stop smoking. Smoking makes the  problem worse.  Do not smoke or use any products that contain nicotine or tobacco. If you need help quitting, ask your doctor.  Avoid being around things that make your breathing worse. This may include smoke, chemicals, and fumes.  Stay active, but remember to rest as well.  Learn and use tips on how to manage stress and control your breathing.  Make sure you get enough sleep. Most adults need at least 7 hours of sleep every night.  Eat healthy foods. Eat smaller meals more often. Rest before meals. Controlled breathing Learn and use tips on how to control your breathing as told by your doctor. Try:  Breathing in (inhaling) through your nose for 1 second. Then, pucker your lips and breath out (exhale) through your lips for 2 seconds.  Putting one hand on your belly (abdomen). Breathe in slowly through your nose for 1 second. Your hand on your belly should move out. Pucker your lips and breathe out slowly through your lips. Your hand on your belly should move in as you breathe out.   Controlled coughing Learn and use controlled coughing to clear mucus from your lungs. Follow these steps: 1. Lean your head a little forward. 2. Breathe in deeply. 3. Try to hold your breath for 3 seconds. 4. Keep your mouth slightly open while coughing 2 times. 5. Spit any mucus out into a tissue. 6. Rest and do the steps again 1 or 2 times as needed. General instructions  Make sure you get all the shots (vaccines) that your doctor recommends. Ask your doctor about a flu shot and a pneumonia shot.    Use oxygen therapy and pulmonary rehabilitation if told by your doctor. If you need home oxygen therapy, ask your doctor if you should buy a tool to measure your oxygen level (oximeter).  Make a COPD action plan with your doctor. This helps you to know what to do if you feel worse than usual.  Manage any other conditions you have as told by your doctor.  Avoid going outside when it is very hot, cold, or  humid.  Avoid people who have a sickness you can catch (contagious).  Keep all follow-up visits. Contact a doctor if:  You cough up more mucus than usual.  There is a change in the color or thickness of the mucus.  It is harder to breathe than usual.  Your breathing is faster than usual.  You have trouble sleeping.  You need to use your medicines more often than usual.  You have trouble doing your normal activities such as getting dressed or walking around the house. Get help right away if:  You have shortness of breath while resting.  You have shortness of breath that stops you from: ? Being able to talk. ? Doing normal activities.  Your chest hurts for longer than 5 minutes.  Your skin color is more blue than usual.  Your pulse oximeter shows that you have low oxygen for longer than 5 minutes.  You have a fever.  You feel too tired to breathe normally. These symptoms may represent a serious problem that is an emergency. Do not wait to see if the symptoms will go away. Get medical help right away. Call your local emergency services (911 in the [place] Do not drive yourself to the hospital. Summary  Chronic obstructive pulmonary disease (COPD) is a long-term lung problem.  The way your lungs work will never return to normal. Usually the condition gets worse over time. There are things you can do to keep yourself as healthy as possible.  Take over-the-counter and prescription medicines only as told by your doctor.  If you smoke, stop. Smoking makes the problem worse. This information is not intended to replace advice given to you by your health care provider. Make sure you discuss any questions you have with your health care provider. Document Revised: 01/31/2020 Document Reviewed: 01/31/2020 Elsevier Patient Education  2021 [employer]vier Inc.   

## 2020-06-12 NOTE — Progress Notes (Signed)
Pt refused to wear home O2 from his room to the car. No s/s of respiratory distress noted at this time. Education provided and pt reports he only wants to wear it with increased activity.

## 2020-06-12 NOTE — Progress Notes (Signed)
RN reviewed discharge paperwork with pt. All questions addressed. Pt's home O2 was delivered to bedside. Pt reports receiving education from oxygen delivery company. RN reinforced education on home O2. IV removed. Tele monitor removed. No further needs at this time. Pt getting himself dressed and will be discharge via private vehicle with his brother.

## 2020-06-16 LAB — CULTURE, BLOOD (ROUTINE X 2)
Culture: NO GROWTH
Culture: NO GROWTH
Special Requests: ADEQUATE
Special Requests: ADEQUATE

## 2020-06-20 ENCOUNTER — Emergency Department (HOSPITAL_COMMUNITY): Payer: Medicaid Other

## 2020-06-20 ENCOUNTER — Encounter (HOSPITAL_COMMUNITY): Payer: Self-pay | Admitting: Emergency Medicine

## 2020-06-20 ENCOUNTER — Encounter (HOSPITAL_COMMUNITY): Payer: Self-pay

## 2020-06-20 ENCOUNTER — Emergency Department (HOSPITAL_COMMUNITY)
Admission: EM | Admit: 2020-06-20 | Discharge: 2020-06-21 | Disposition: A | Discharge status: Home / Self Care | Payer: Medicaid Other | Source: Home / Self Care | Attending: Emergency Medicine | Admitting: Emergency Medicine

## 2020-06-20 ENCOUNTER — Other Ambulatory Visit: Payer: Self-pay

## 2020-06-20 ENCOUNTER — Emergency Department (HOSPITAL_COMMUNITY)
Admission: EM | Admit: 2020-06-20 | Discharge: 2020-06-20 | Disposition: A | Discharge status: Home / Self Care | Payer: Medicaid Other | Attending: Emergency Medicine | Admitting: Emergency Medicine

## 2020-06-20 DIAGNOSIS — I5032 Chronic diastolic (congestive) heart failure: Secondary | ICD-10-CM | POA: Insufficient documentation

## 2020-06-20 DIAGNOSIS — J069 Acute upper respiratory infection, unspecified: Secondary | ICD-10-CM | POA: Insufficient documentation

## 2020-06-20 DIAGNOSIS — J449 Chronic obstructive pulmonary disease, unspecified: Secondary | ICD-10-CM | POA: Insufficient documentation

## 2020-06-20 DIAGNOSIS — R1084 Generalized abdominal pain: Secondary | ICD-10-CM | POA: Diagnosis present

## 2020-06-20 DIAGNOSIS — Z7951 Long term (current) use of inhaled steroids: Secondary | ICD-10-CM | POA: Insufficient documentation

## 2020-06-20 DIAGNOSIS — Z20822 Contact with and (suspected) exposure to covid-19: Secondary | ICD-10-CM | POA: Diagnosis not present

## 2020-06-20 DIAGNOSIS — Z9981 Dependence on supplemental oxygen: Secondary | ICD-10-CM | POA: Insufficient documentation

## 2020-06-20 DIAGNOSIS — R1030 Lower abdominal pain, unspecified: Secondary | ICD-10-CM

## 2020-06-20 DIAGNOSIS — F17210 Nicotine dependence, cigarettes, uncomplicated: Secondary | ICD-10-CM | POA: Diagnosis not present

## 2020-06-20 DIAGNOSIS — Z8711 Personal history of peptic ulcer disease: Secondary | ICD-10-CM | POA: Diagnosis not present

## 2020-06-20 DIAGNOSIS — K625 Hemorrhage of anus and rectum: Secondary | ICD-10-CM | POA: Insufficient documentation

## 2020-06-20 LAB — POC OCCULT BLOOD, ED: Fecal Occult Bld: NEGATIVE

## 2020-06-20 LAB — TYPE AND SCREEN
ABO/RH(D): A POS
Antibody Screen: NEGATIVE

## 2020-06-20 LAB — CBC WITH DIFFERENTIAL/PLATELET
Abs Immature Granulocytes: 0.06 10*3/uL (ref 0.00–0.07)
Basophils Absolute: 0 10*3/uL (ref 0.0–0.1)
Basophils Relative: 1 %
Eosinophils Absolute: 0 10*3/uL (ref 0.0–0.5)
Eosinophils Relative: 0 %
HCT: 44.9 % (ref 39.0–52.0)
Hemoglobin: 14.2 g/dL (ref 13.0–17.0)
Immature Granulocytes: 2 %
Lymphocytes Relative: 73 %
Lymphs Abs: 2.4 10*3/uL (ref 0.7–4.0)
MCH: 27.6 pg (ref 26.0–34.0)
MCHC: 31.6 g/dL (ref 30.0–36.0)
MCV: 87.2 fL (ref 80.0–100.0)
Monocytes Absolute: 0.3 10*3/uL (ref 0.1–1.0)
Monocytes Relative: 10 %
Neutro Abs: 0.5 10*3/uL — ABNORMAL LOW (ref 1.7–7.7)
Neutrophils Relative %: 14 %
Platelets: 176 10*3/uL (ref 150–400)
RBC: 5.15 MIL/uL (ref 4.22–5.81)
RDW: 18.7 % — ABNORMAL HIGH (ref 11.5–15.5)
WBC: 3.3 10*3/uL — ABNORMAL LOW (ref 4.0–10.5)
nRBC: 3.4 % — ABNORMAL HIGH (ref 0.0–0.2)

## 2020-06-20 LAB — COMPREHENSIVE METABOLIC PANEL
ALT: 13 U/L (ref 0–44)
AST: 43 U/L — ABNORMAL HIGH (ref 15–41)
Albumin: 2.8 g/dL — ABNORMAL LOW (ref 3.5–5.0)
Alkaline Phosphatase: 96 U/L (ref 38–126)
Anion gap: 8 (ref 5–15)
BUN: 24 mg/dL — ABNORMAL HIGH (ref 6–20)
CO2: 25 mmol/L (ref 22–32)
Calcium: 7.4 mg/dL — ABNORMAL LOW (ref 8.9–10.3)
Chloride: 97 mmol/L — ABNORMAL LOW (ref 98–111)
Creatinine, Ser: 0.73 mg/dL (ref 0.61–1.24)
GFR, Estimated: >60 mL/min (ref >60)
Glucose, Bld: 95 mg/dL (ref 70–99)
Potassium: 3.6 mmol/L (ref 3.5–5.1)
Sodium: 130 mmol/L — ABNORMAL LOW (ref 135–145)
Total Bilirubin: 1.4 mg/dL — ABNORMAL HIGH (ref 0.3–1.2)
Total Protein: 8.4 g/dL — ABNORMAL HIGH (ref 6.5–8.1)

## 2020-06-20 LAB — PROTIME-INR
INR: 1.1 (ref 0.8–1.2)
Prothrombin Time: 14.2 seconds (ref 11.4–15.2)

## 2020-06-20 LAB — LIPASE, BLOOD: Lipase: 66 U/L — ABNORMAL HIGH (ref 11–51)

## 2020-06-20 LAB — LACTIC ACID, PLASMA: Lactic Acid, Venous: 1.8 mmol/L (ref 0.5–1.9)

## 2020-06-20 IMAGING — DX DG CHEST 1V PORT
1 series · 1 of 1 positions shown · non-contrast
Comparison: Portable exam [RC] hours compared to [DATE]

CLINICAL DATA: Abdominal pain, rectal bleeding since [REDACTED], cough,
COPD, smoker

EXAM:
PORTABLE CHEST 1 VIEW

[chest ap]
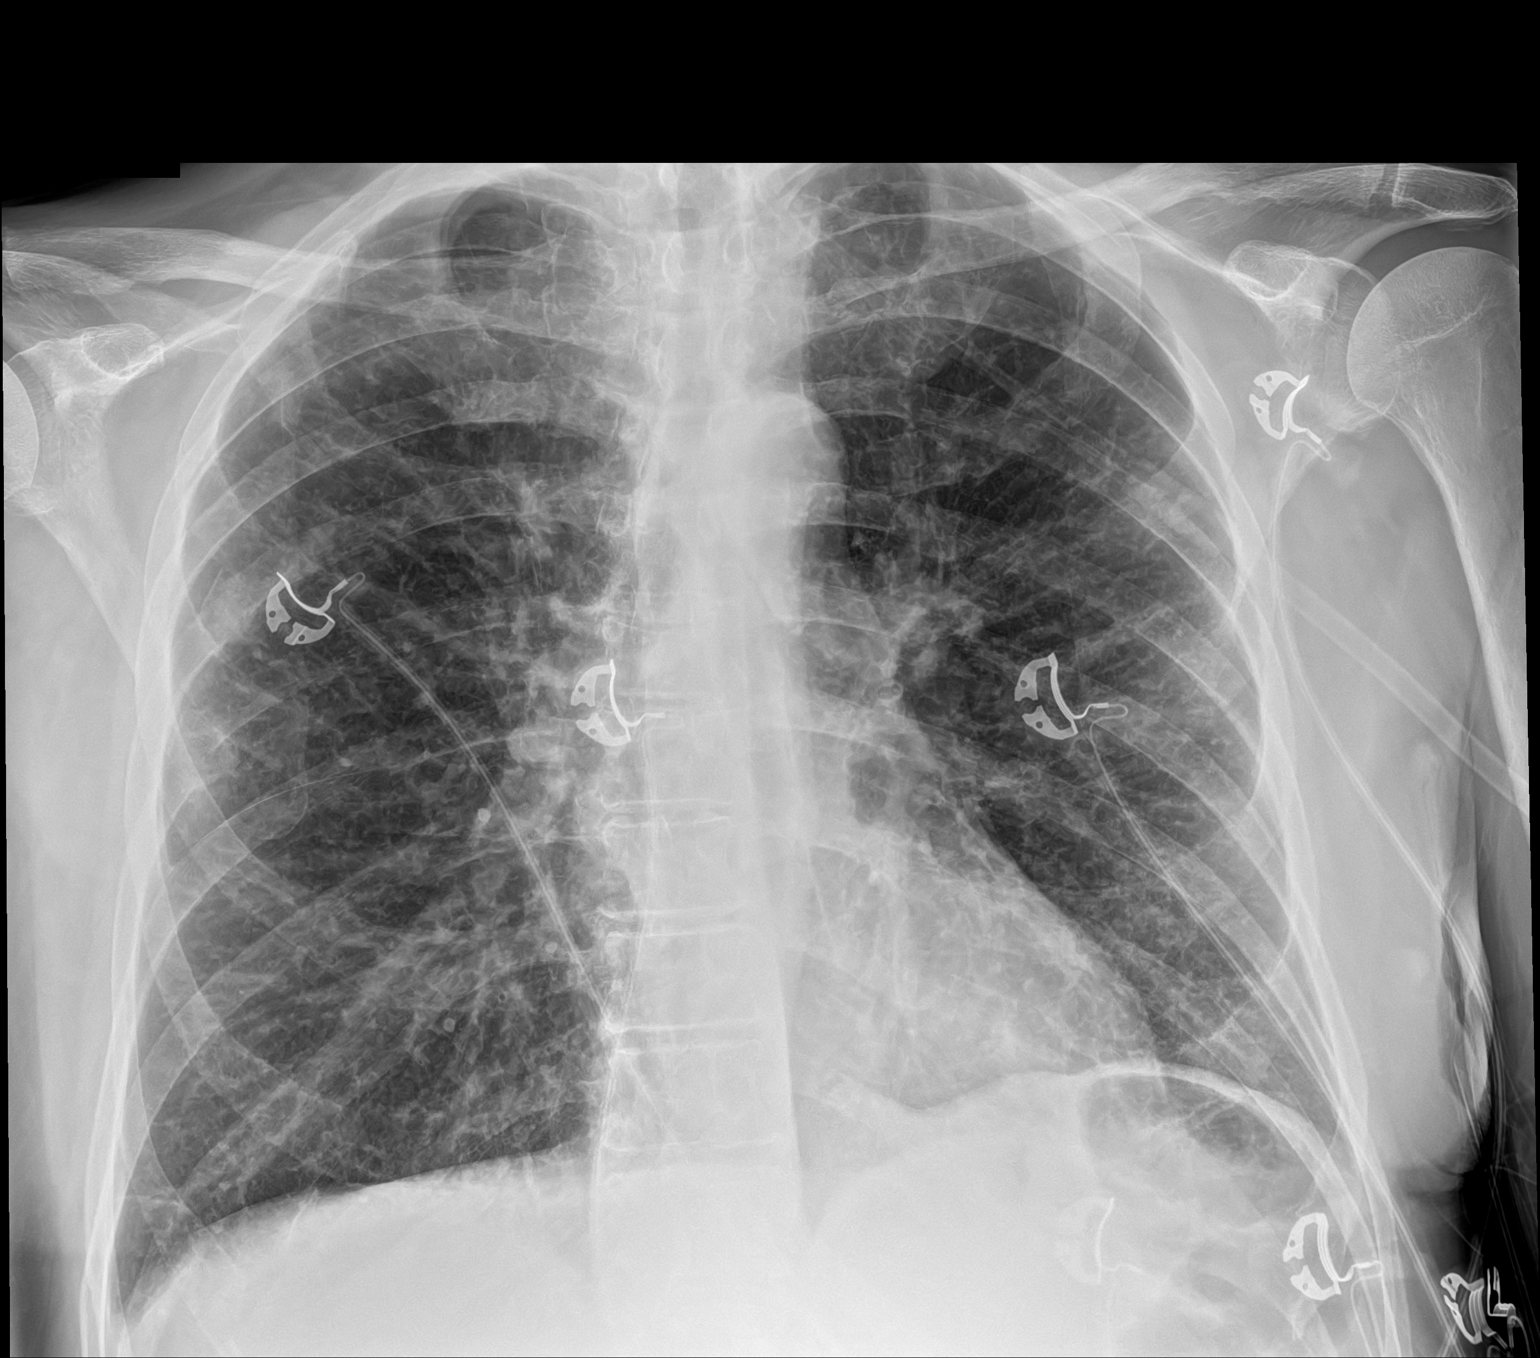

[1 of 1 positions shown; findings below may reference images not displayed]

FINDINGS: Normal heart size, mediastinal contours, and pulmonary vascularity.

Emphysematous and bronchitic changes consistent with COPD.

Scattered interstitial prominence in both lungs, slightly increased
on LEFT since earlier study, question minimal atypical infection.

No pleural effusion or pneumothorax.

Bones demineralized with old fractures of the posterolateral RIGHT
sixth and seventh ribs again noted.
IMPRESSION: COPD changes with slightly increased interstitial markings
especially in LEFT lung since previous exam, could represent
question atypical infection.

## 2020-06-20 IMAGING — CT CT ABD-PELV W/ CM
2 of 5 series · 16 of 46 positions shown, 18 images · IV contrast (Omnipaque or Isovue)
Comparison: CT the abdomen and pelvis [DATE].

CLINICAL DATA: 56-year-old male with history of generalized
abdominal pain since [DATE].

EXAM:
CT ABDOMEN AND PELVIS WITH CONTRAST
TECHNIQUE: Multidetector CT imaging of the abdomen and pelvis was performed
using the standard protocol following bolus administration of
intravenous contrast.
CONTRAST:  100mL OMNIPAQUE IOHEXOL 300 MG/ML  SOLN

[Series 2: axial st · axial · 0.83mm/px · z∈[-590,-190]mm · 13 of 91 slices shown, 15 images]
[im 6/91  soft-tissue]
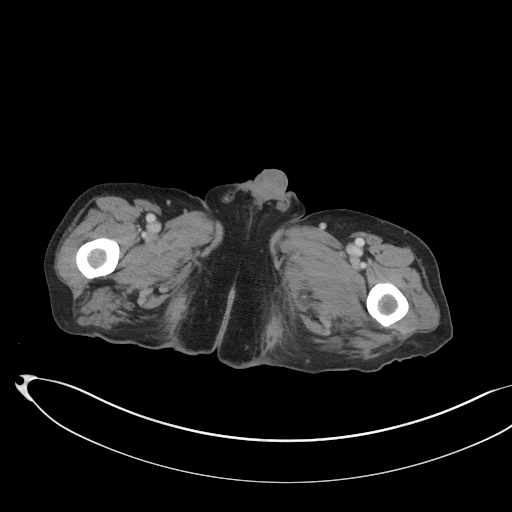
[im 6/91  bone]
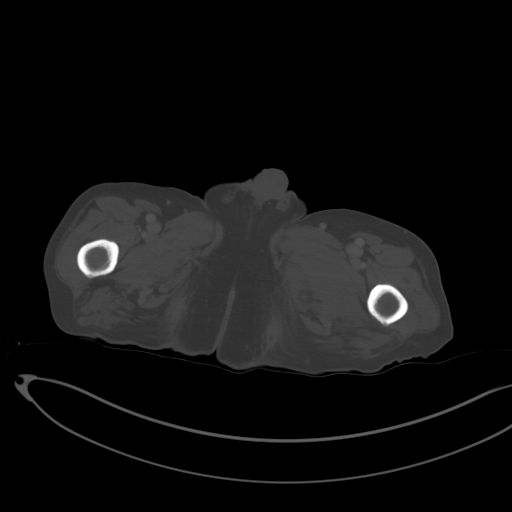
[im 11/91  soft-tissue]
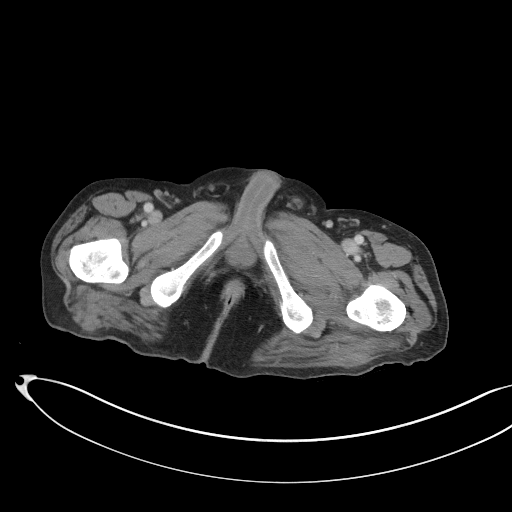
[im 21/91  soft-tissue]
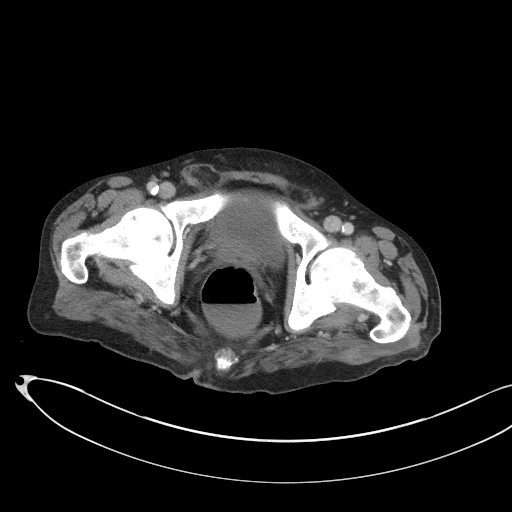
[im 26/91  soft-tissue]
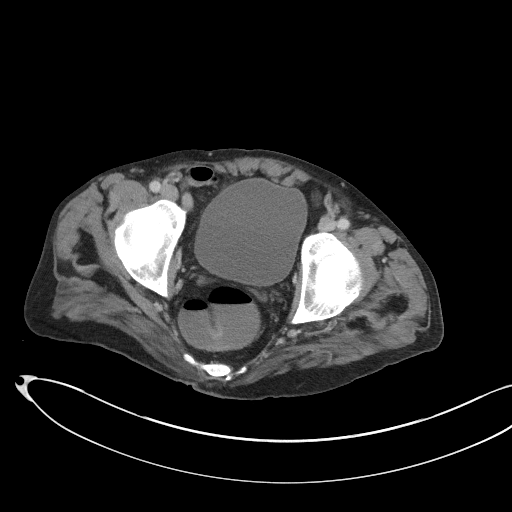
[im 31/91  soft-tissue]
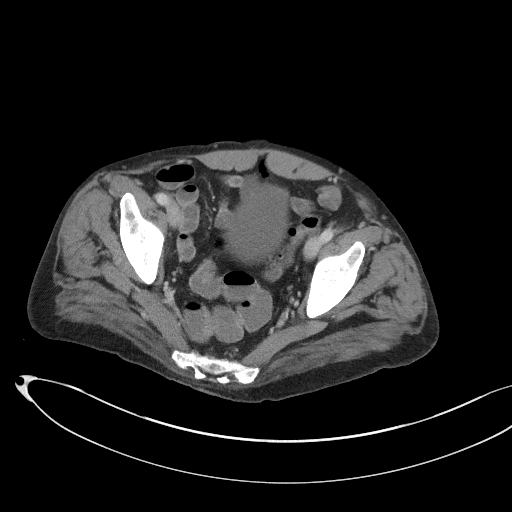
[im 41/91  soft-tissue]
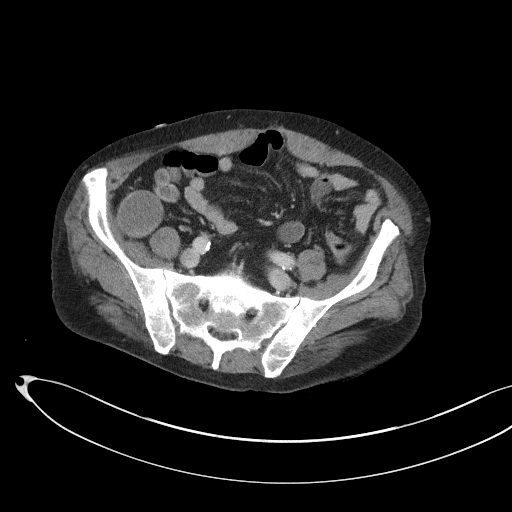
[im 46/91  soft-tissue]
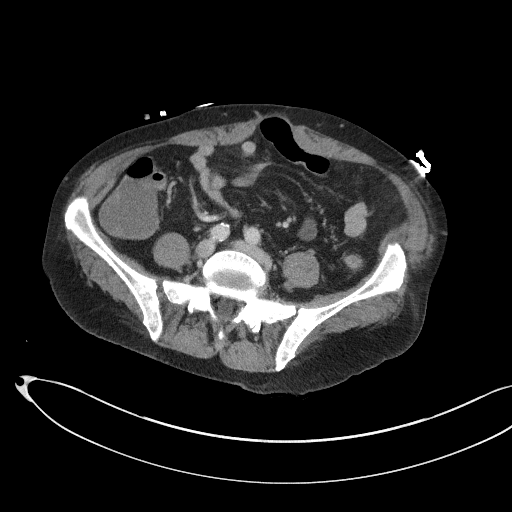
[im 51/91  soft-tissue]
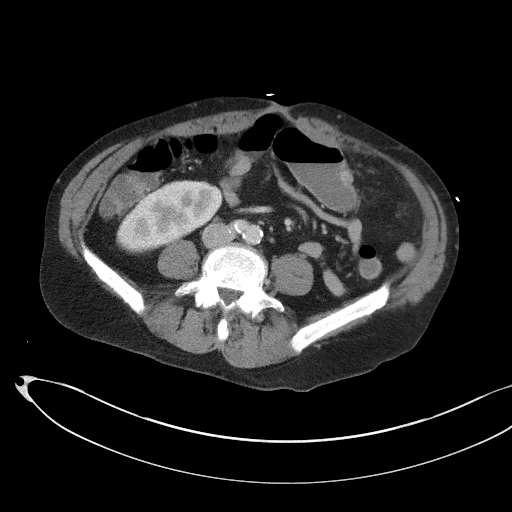
[im 61/91  soft-tissue]
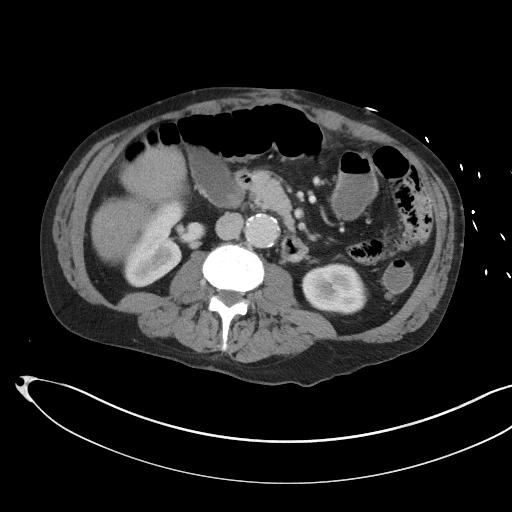
[im 61/91  bone]
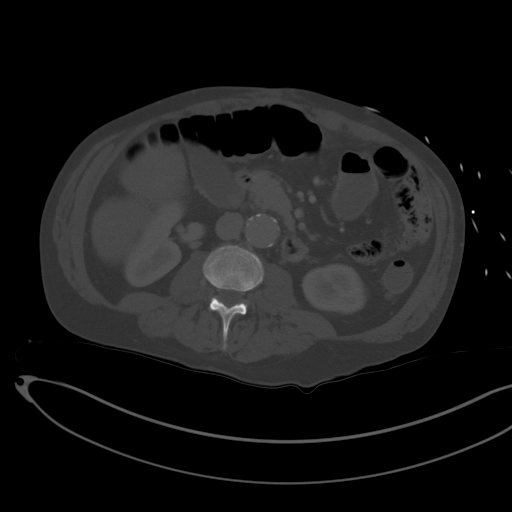
[im 66/91  soft-tissue]
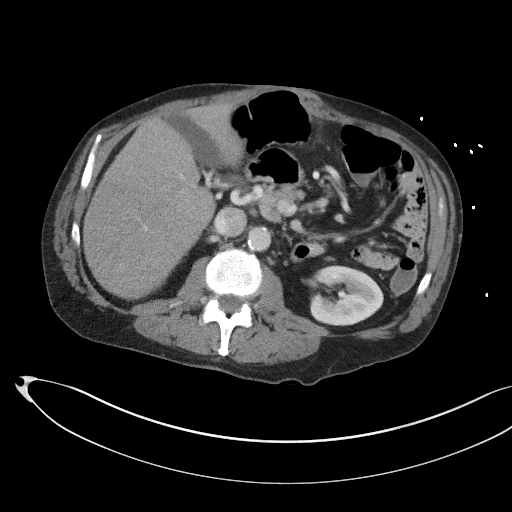
[im 71/91  soft-tissue]
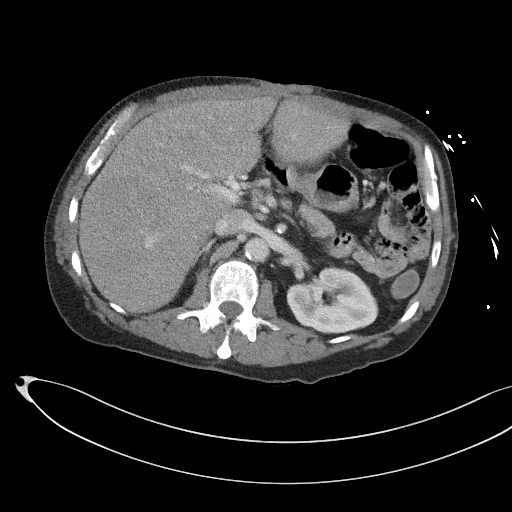
[im 81/91  soft-tissue]
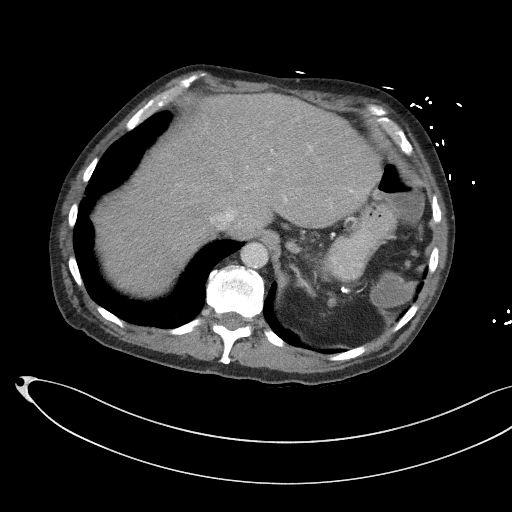
[im 86/91  soft-tissue]
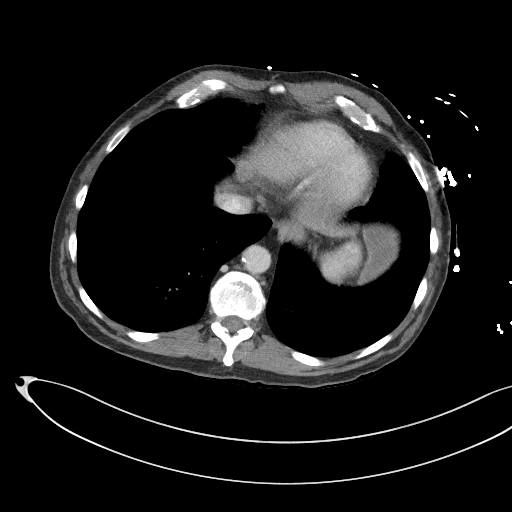

[Series 5: coronal st · coronal · 0.79mm/px · 3 of 104 slices shown]
[im 35/104  soft-tissue]
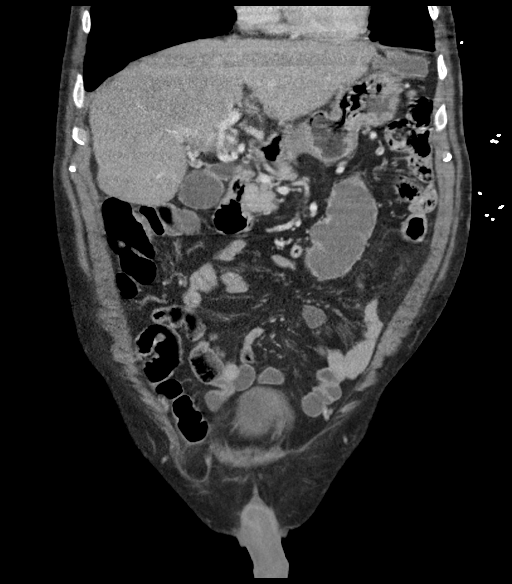
[im 46/104  soft-tissue]
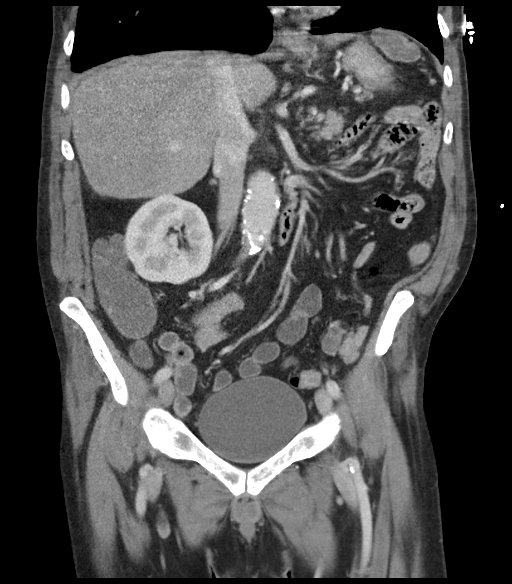
[im 58/104  soft-tissue]
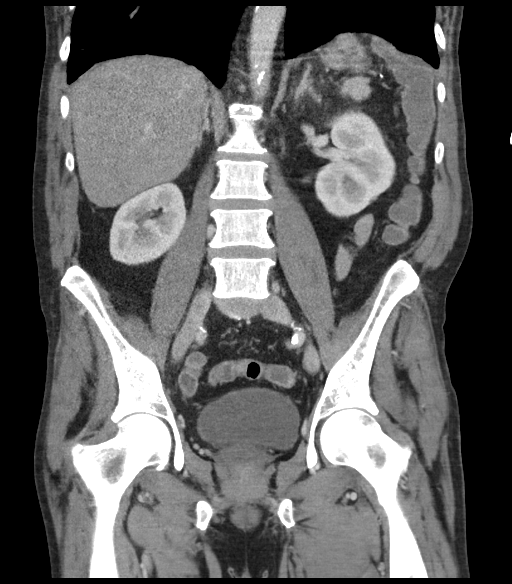

[16 of 46 positions shown; findings below may reference images not displayed]

FINDINGS: Lower chest: Aortic atherosclerosis.

Hepatobiliary: No suspicious cystic or solid hepatic lesions. No
intra or extrahepatic biliary ductal dilatation. Small calcified and
noncalcified gallstones lying dependently in the gallbladder.
Gallbladder is not distended. Gallbladder wall thickness is normal.
No pericholecystic fluid or surrounding inflammatory changes.

Pancreas: No pancreatic mass. No pancreatic ductal dilatation. No
pancreatic or peripancreatic fluid collections or inflammatory
changes.

Spleen: Status post splenectomy.

Adrenals/Urinary Tract: Bilateral kidneys and adrenal glands are
normal in appearance. No hydroureteronephrosis. Urinary bladder is
normal in appearance.

Stomach/Bowel: Normal appearance of the stomach. No pathologic
dilatation of small bowel or colon. A few scattered colonic
diverticulae are noted, without surrounding inflammatory changes to
suggest an acute diverticulitis at this time. Normal appendix.

Vascular/Lymphatic: Aortic atherosclerosis with mild fusiform
aneurysmal dilatation of the infrarenal abdominal aorta which
measures up to 3.2 x 2.8 cm in diameter. No lymphadenopathy noted in
the abdomen or pelvis.

Reproductive: Prostate gland and seminal vesicles are unremarkable
in appearance.

Other: No significant volume of ascites.  No pneumoperitoneum.

Musculoskeletal: There are no aggressive appearing lytic or blastic
lesions noted in the visualized portions of the skeleton.
IMPRESSION: 1. No acute findings are noted in the abdomen or pelvis to account
for the patient's symptoms.
2. Mild colonic diverticulosis without evidence of acute
diverticulitis at this time.
3. Aortic atherosclerosis with mild fusiform aneurysmal dilatation
of the infrarenal abdominal aorta (3.2 x 2.8 cm). Recommend
follow-up ultrasound every 3 years. This recommendation follows ACR
consensus guidelines: White Paper of the ACR Incidental Findings
Committee II on Vascular Findings. [HOSPITAL] [Q9];
4. Cholelithiasis without evidence of acute cholecystitis at this
time.

## 2020-06-20 MED ORDER — PREDNISONE 20 MG PO TABS: 20.0000 mg | ORAL_TABLET | Freq: Every day | ORAL | 0 refills | Status: AC

## 2020-06-20 MED ORDER — SODIUM CHLORIDE 0.9 % IV SOLN: INTRAVENOUS | Status: DC

## 2020-06-20 MED ORDER — PANTOPRAZOLE SODIUM 40 MG IV SOLR
40.0000 mg | Freq: Once | INTRAVENOUS | Status: AC
  Administered 2020-06-20: 40 mg via INTRAVENOUS
  Filled 2020-06-20: qty 40

## 2020-06-20 MED ORDER — IOHEXOL 300 MG/ML  SOLN
100.0000 mL | Freq: Once | INTRAMUSCULAR | Status: AC | PRN
  Administered 2020-06-20: 100 mL via INTRAVENOUS

## 2020-06-20 MED ORDER — SODIUM CHLORIDE 0.9 % IV BOLUS
1000.0000 mL | Freq: Once | INTRAVENOUS | Status: AC
  Administered 2020-06-20: 1000 mL via INTRAVENOUS

## 2020-06-20 MED ORDER — ALBUTEROL SULFATE HFA 108 (90 BASE) MCG/ACT IN AERS
2.0000 | INHALATION_SPRAY | Freq: Once | RESPIRATORY_TRACT | Status: AC
  Administered 2020-06-20: 2 via RESPIRATORY_TRACT
  Filled 2020-06-20: qty 6.7

## 2020-06-20 MED ORDER — AZITHROMYCIN 250 MG PO TABS: 250.0000 mg | ORAL_TABLET | Freq: Every day | ORAL | 0 refills | Status: AC

## 2020-06-20 MED ORDER — AZITHROMYCIN 250 MG PO TABS
500.0000 mg | ORAL_TABLET | Freq: Once | ORAL | Status: AC
  Administered 2020-06-20: 500 mg via ORAL
  Filled 2020-06-20: qty 2

## 2020-06-20 NOTE — ED Triage Notes (Signed)
Pt c/o of abdominal pain with rectal bleeding since Saturday.

## 2020-06-20 NOTE — ED Provider Notes (Signed)
[facility] DEPT Provider Note   CSN: [id number] Arrival date & time: 06/20/20  1937     History Chief Complaint  Patient presents with  . Rectal Bleeding    Marcus [name] is a 60 y.o. male.  Patient is a 60 year old male with past medical history of COPD, CHF, prior splenectomy, Felty's syndrome.  Patient presents today for evaluation of lower abdominal pain and rectal bleeding.  This has been occurring for the past 2 days.  He describes dark stool along with pain to the lower abdomen.  He denies fevers or chills.  Patient seen at [facility] this afternoon and had extensive work-up including CT scan and laboratory studies.  These were all unremarkable and patient was discharged.  He was also found to have heme-negative stool.  Patient presents here for a second opinion.  The history is provided by the patient.       Past Medical History:  Diagnosis Date  . COPD (chronic obstructive pulmonary disease) ([place])   . 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 Hernia, epigastric   . Pancytopenia ([place])   . Seropositive rheumatoid arthritis ([place])   . Tobacco use disorder     Patient Active Problem List   Diagnosis Date Noted  . COPD exacerbation ([place]) 06/11/2020  . Chest pain 06/11/2020  . Cellulitis 06/11/2020  . Chronic diastolic CHF (congestive heart failure) ([place]) 06/11/2020  . Severe sepsis with septic shock ([place]) 01/20/2020  . Acute hypoxemic respiratory failure ([place]) 01/20/2020  . Hypokalemia 01/20/2020  . Perforated prepyloric ulcer 01/18/2020 01/18/2020  . Pneumoperitoneum 01/17/2020  . COPD with acute exacerbation ([place]) 08/18/2019  . 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 Anemia 11/14/2015  . Seropositive rheumatoid arthritis ([place])   . Chronic obstructive pulmonary disease ([place])   . Protein-calorie malnutrition, severe 07/24/2015  . Pancytopenia ([place]) 07/23/2015  . 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 Leukopenia 10/21/2012  . 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  Abnormal EKG 10/21/2012  . Joint pain 10/21/2012  . Splenomegaly 10/21/2012    Past Surgical History:  Procedure Laterality Date  . INCISIONAL HERNIA REPAIR  01/17/2020   Procedure: HERNIA REPAIR INCISIONAL AND Silvestre Gunner;  Surgeon: Kinsinger, Arta Bruce, MD;  Location: [facility] ORS;  Service: General;;  . LAPAROTOMY N/A 01/17/2020   Procedure: EXPLORATORY LAPAROTOMY;  Surgeon: Kieth Brightly Arta Bruce, MD;  Location: [facility] ORS;  Service: General;  Laterality: N/A;       Family History  Problem Relation Age of Onset  . CAD Brother   . COPD Brother     Social History   Tobacco Use  . Smoking status: Current Every Day Smoker    Packs/day: 1.00    Types: Cigarettes  . Smokeless tobacco: Never Used  Substance Use Topics  . Alcohol use: No    Alcohol/week: 0.0 standard drinks  . Drug use: Yes    Types: Marijuana    Home Medications Prior to Admission medications   Medication Sig Start Date End Date Taking? Authorizing Provider  albuterol (PROVENTIL) (2.5 MG/3ML) 0.083% nebulizer solution Take 3 mLs (2.5 mg total) by nebulization every 6 (six) hours as needed for wheezing or shortness of breath. 01/24/20   Kinsinger, Arta Bruce, MD  azithromycin (ZITHROMAX) 250 MG tablet Take 1 tablet (250 mg total) by mouth daily for 4 days. 1 every day until finished. 06/20/20 06/24/20  Marcello Fennel, PA-C  mometasone-formoterol (DULERA) 100-5 MCG/ACT AERO Inhale 2 puffs into the lungs 2 (two) times daily. 06/12/20   Bonnielee Haff,  MD  predniSONE (DELTASONE) 20 MG tablet Take 3 tablets once daily for 3 days followed by 2 tablets once daily for 3 days followed by 1 tablet once daily for 3 days and then stop 06/12/20   Bonnielee Haff, MD  predniSONE (DELTASONE) 20 MG tablet Take 1 tablet (20 mg total) by mouth daily for 5 days. 06/20/20 06/25/20  Marcello Fennel, PA-C    Allergies    Levaquin [levofloxacin in d5w] and Methotrexate  Review of Systems   Review of Systems  All other systems  reviewed and are negative.   Physical Exam Updated Vital Signs BP 115/69   Pulse 97   Temp 97.9 F (36.6 C) (Oral)   Resp 16   SpO2 94%   Physical Exam Vitals and nursing note reviewed.  Constitutional:      General: He is not in acute distress.    Appearance: He is well-developed. He is not diaphoretic.  HENT:     Head: Normocephalic and atraumatic.  Cardiovascular:     Rate and Rhythm: Normal rate and regular rhythm.     Heart sounds: No murmur heard. No friction rub.  Pulmonary:     Effort: Pulmonary effort is normal. No respiratory distress.     Breath sounds: Normal breath sounds. No wheezing or rales.  Abdominal:     General: Bowel sounds are normal. There is no distension.     Palpations: Abdomen is soft.     Tenderness: There is no abdominal tenderness.  Genitourinary:    Rectum: Normal. Guaiac result negative.  Musculoskeletal:        General: Normal range of motion.     Cervical back: Normal range of motion and neck supple.  Skin:    General: Skin is warm and dry.  Neurological:     Mental Status: He is alert and oriented to person, place, and time.     Coordination: Coordination normal.     ED Results / Procedures / Treatments   Labs (all labs ordered are listed, but only abnormal results are displayed) Labs Reviewed  CBC WITH DIFFERENTIAL/PLATELET  POC OCCULT BLOOD, ED  TYPE AND SCREEN    EKG None  Radiology CT ABDOMEN PELVIS W CONTRAST  Result Date: 06/20/2020 CLINICAL DATA:  60 year old male with history of generalized abdominal pain since 06/10/2020. EXAM: CT ABDOMEN AND PELVIS WITH CONTRAST TECHNIQUE: Multidetector CT imaging of the abdomen and pelvis was performed using the standard protocol following bolus administration of intravenous contrast. CONTRAST:  162mL OMNIPAQUE IOHEXOL 300 MG/ML  SOLN COMPARISON:  CT the abdomen and pelvis 06/10/2020. FINDINGS: Lower chest: Aortic atherosclerosis. Hepatobiliary: No suspicious cystic or solid  hepatic lesions. No intra or extrahepatic biliary ductal dilatation. Small calcified and noncalcified gallstones lying dependently in the gallbladder. Gallbladder is not distended. Gallbladder wall thickness is normal. No pericholecystic fluid or surrounding inflammatory changes. Pancreas: No pancreatic mass. No pancreatic ductal dilatation. No pancreatic or peripancreatic fluid collections or inflammatory changes. Spleen: Status post splenectomy. Adrenals/Urinary Tract: Bilateral kidneys and adrenal glands are normal in appearance. No hydroureteronephrosis. Urinary bladder is normal in appearance. Stomach/Bowel: Normal appearance of the stomach. No pathologic dilatation of small bowel or colon. A few scattered colonic diverticulae are noted, without surrounding inflammatory changes to suggest an acute diverticulitis at this time. Normal appendix. Vascular/Lymphatic: Aortic atherosclerosis with mild fusiform aneurysmal dilatation of the infrarenal abdominal aorta which measures up to 3.2 x 2.8 cm in diameter. No lymphadenopathy noted in the abdomen or pelvis. Reproductive: Prostate gland and  seminal vesicles are unremarkable in appearance. Other: No significant volume of ascites.  No pneumoperitoneum. Musculoskeletal: There are no aggressive appearing lytic or blastic lesions noted in the visualized portions of the skeleton. IMPRESSION: 1. No acute findings are noted in the abdomen or pelvis to account for the patient's symptoms. 2. Mild colonic diverticulosis without evidence of acute diverticulitis at this time. 3. Aortic atherosclerosis with mild fusiform aneurysmal dilatation of the infrarenal abdominal aorta (3.2 x 2.8 cm). Recommend follow-up ultrasound every 3 years. This recommendation follows ACR consensus guidelines: White Paper of the ACR Incidental Findings Committee II on Vascular Findings. J Am Coll Radiol 2013; 10:789-794. 4. Cholelithiasis without evidence of acute cholecystitis at this time.  Electronically Signed   By: Vinnie Langton M.D.   On: 06/20/2020 17:08   DG Chest Port 1 View  Result Date: 06/20/2020 CLINICAL DATA:  Abdominal pain, rectal bleeding since Sunday, cough, COPD, smoker EXAM: PORTABLE CHEST 1 VIEW COMPARISON:  Portable exam 1507 hours compared to 06/10/2020 FINDINGS: Normal heart size, mediastinal contours, and pulmonary vascularity. Emphysematous and bronchitic changes consistent with COPD. Scattered interstitial prominence in both lungs, slightly increased on LEFT since earlier study, question minimal atypical infection. No pleural effusion or pneumothorax. Bones demineralized with old fractures of the posterolateral RIGHT sixth and seventh ribs again noted. IMPRESSION: COPD changes with slightly increased interstitial markings especially in LEFT lung since previous exam, could represent question atypical infection. Electronically Signed   By: Lavonia Dana M.D.   On: 06/20/2020 15:31    Procedures Procedures   Medications Ordered in ED Medications - No data to display  ED Course  I have reviewed the triage vital signs and the nursing notes.  Pertinent labs & imaging results that were available during my care of the patient were reviewed by me and considered in my medical decision making (see chart for details).    MDM Rules/Calculators/A&P  Patient is a 60 year old male presenting with complaints of rectal bleeding and abdominal pain.  Patient's hemoglobin has slightly down from this afternoon after being seen at [facility], however he appears hemodynamically stable and is heme-negative.  Work-up at [facility] reviewed and I see no indication for additional work-up at this time.  Patient has never had a colonoscopy and I feel as though this is indicated.  Patient will be referred to gastroenterology and advised to follow-up with them.  Final Clinical Impression(s) / ED Diagnoses Final diagnoses:  None    Rx / DC Orders ED Discharge Orders    None        Veryl Speak, MD 06/21/20 0131

## 2020-06-20 NOTE — ED Notes (Signed)
Pt's brother Jeneen Rinks called to check on him.

## 2020-06-20 NOTE — Discharge Instructions (Addendum)
I suspect you are suffering from a viral infection.  I started you on antibiotics and steroids please take as prescribed.  Please continue with your home medications as prescribed.  I like you to follow-up with your primary care doctor in 1 week's time for reevaluation.  Come back to the emergency department if you develop chest pain, shortness of breath, severe abdominal pain, uncontrolled nausea, vomiting, diarrhea.

## 2020-06-20 NOTE — ED Provider Notes (Signed)
[facility] EMERGENCY DEPARTMENT Provider Note   CSN: [id number] Arrival date & time: 06/20/20  1334     History Chief Complaint  Patient presents with  . Rectal Bleeding    Marcus [name] is a 60 y.o. male.  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  He endorses that both came on at the same time suddenly, he endorses a crampy abdominal pain in the center of his stomach, does not radiate, not associated with nausea or vomiting.  He does endorse lack of appetite. He endorses that he has had frequent bouts of diarrhea, with bright red blood in it, he denies  recent trauma to the area.  Patient states this feels similar to last time he perforated his stomach ulcer, he also states that he has felt more fatigued and tired lately as if he has no energy, he denies chest pain, shortness of breath, worsening pedal edema.  He denies alcohol or NSAID use, does endorse that he smokes daily.  Patient denies alleviating factors.  Patient denies headaches, fevers, chills, shortness of breath, chest pain, nausea, vomiting, diarrhea, worsening pedal edema.  Past Medical History:  Diagnosis Date  . COPD (chronic obstructive pulmonary disease) ([place])   . 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 Hernia, epigastric   . Pancytopenia ([place])   . Seropositive rheumatoid arthritis ([place])   . Tobacco use disorder     Patient Active Problem List   Diagnosis Date Noted  . COPD exacerbation ([place]) 06/11/2020  . Chest pain 06/11/2020  . Cellulitis 06/11/2020  . Chronic diastolic CHF (congestive heart failure) ([place]) 06/11/2020  . Severe sepsis with septic shock ([place]) 01/20/2020  . Acute hypoxemic respiratory failure ([place]) 01/20/2020  . Hypokalemia 01/20/2020  . Perforated prepyloric ulcer 01/18/2020 01/18/2020  . Pneumoperitoneum  01/17/2020  . COPD with acute exacerbation ([place]) 08/18/2019  . 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 Anemia 11/14/2015  . Seropositive rheumatoid arthritis ([place])   . Chronic obstructive pulmonary disease ([place])   . Protein-calorie malnutrition, severe 07/24/2015  . Pancytopenia ([place]) 07/23/2015  . 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[place]) 06/06/2013  . Leukopenia 10/21/2012  . 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 Abnormal EKG 10/21/2012  . Joint pain 10/21/2012  . Splenomegaly 10/21/2012    Past Surgical History:  Procedure Laterality Date  . INCISIONAL HERNIA REPAIR  01/17/2020   Procedure: HERNIA REPAIR INCISIONAL AND Silvestre Gunner;  Surgeon: Kinsinger, Arta Bruce, MD;  Location: [facility] ORS;  Service: General;;  . LAPAROTOMY N/A 01/17/2020   Procedure: EXPLORATORY LAPAROTOMY;  Surgeon: Kieth Brightly Arta Bruce, MD;  Location: [facility] ORS;  Service: General;  Laterality: N/A;       Family History  Problem Relation Age of Onset  . CAD Brother   . COPD Brother     Social History   Tobacco Use  . Smoking status: Current Every Day Smoker    Packs/day: 1.00    Types: Cigarettes  . Smokeless tobacco: Never Used  Substance Use Topics  . Alcohol use: No    Alcohol/week: 0.0 standard drinks  . Drug use: Yes    Types: Marijuana    Home Medications Prior to Admission medications   Medication Sig Start Date End Date Taking? Authorizing Provider  azithromycin (ZITHROMAX) 250 MG tablet Take 1 tablet (250 mg total) by mouth daily for 4 days. 1 every day until  finished. 06/20/20 06/24/20 Yes Marcello Fennel, PA-C  predniSONE (DELTASONE) 20 MG tablet Take 1 tablet (20 mg total) by mouth daily for 5 days. 06/20/20 06/25/20 Yes Marcello Fennel, PA-C  albuterol (PROVENTIL) (2.5 MG/3ML) 0.083% nebulizer solution Take 3 mLs (2.5 mg total) by nebulization every 6 (six) hours as needed for wheezing or shortness of breath. 01/24/20   Kinsinger, Arta Bruce, MD  mometasone-formoterol (DULERA) 100-5 MCG/ACT AERO Inhale 2 puffs into  the lungs 2 (two) times daily. 06/12/20   Bonnielee Haff, MD  predniSONE (DELTASONE) 20 MG tablet Take 3 tablets once daily for 3 days followed by 2 tablets once daily for 3 days followed by 1 tablet once daily for 3 days and then stop 06/12/20   Bonnielee Haff, MD    Allergies    Levaquin [levofloxacin in d5w] and Methotrexate  Review of Systems   Review of Systems  Constitutional: Positive for fatigue. Negative for chills and fever.  HENT: Negative for congestion and sore throat.   Eyes: Negative for visual disturbance.  Respiratory: Negative for shortness of breath.   Cardiovascular: Negative for chest pain.  Gastrointestinal: Positive for abdominal pain, blood in stool and diarrhea. Negative for constipation, nausea, rectal pain and vomiting.  Genitourinary: Positive for frequency. Negative for enuresis.  Musculoskeletal: Negative for back pain.  Skin: Negative for rash.  Neurological: Negative for dizziness and headaches.  Hematological: Does not bruise/bleed easily.    Physical Exam Updated Vital Signs BP 103/62 (BP Location: Left Arm)   Pulse 78   Temp 98 F (36.7 C) (Oral)   Resp (!) 27   Ht $R'5\' 10"'jo$  (1.778 m)   Wt 78 kg   SpO2 (!) 81%   BMI 24.68 kg/m   Physical Exam Vitals and nursing note reviewed. Exam conducted with a chaperone present.  Constitutional:      General: He is not in acute distress.    Appearance: He is not ill-appearing.     Comments: Patient is in a deconditioned state, chronically on 2 L via nasal cannula  HENT:     Head: Normocephalic and atraumatic.     Nose: No congestion.  Eyes:     Conjunctiva/sclera: Conjunctivae normal.  Cardiovascular:     Rate and Rhythm: Normal rate and regular rhythm.     Pulses: Normal pulses.     Heart sounds: No murmur heard. No friction rub. No gallop.   Pulmonary:     Effort: No respiratory distress.     Breath sounds: Rales present. No wheezing or rhonchi.     Comments: Patient has noted bibasilar Rales,  slowly diminish respiratory sounds on the left versus the right, no rhonchi or stridor present Abdominal:     General: There is no distension.     Palpations: Abdomen is soft.     Tenderness: There is abdominal tenderness. There is no right CVA tenderness or left CVA tenderness.     Comments: Patient's abdomen is visualized, nondistended, hyperactive bowel sounds, dull to percussion, he has a noted ventral hernia with previous surgical scars.  Tender throughout his abdomen, negative Murphy sign, McBurney point, rebound tenderness or peritoneal sign.  Patient no CVA tenderness.  Genitourinary:    Rectum: Normal. Guaiac result negative.  Musculoskeletal:     Right lower leg: No edema.     Left lower leg: No edema.  Skin:    General: Skin is warm and dry.  Neurological:     Mental Status: He is alert.  Psychiatric:  Mood and Affect: Mood normal.     ED Results / Procedures / Treatments   Labs (all labs ordered are listed, but only abnormal results are displayed) Labs Reviewed  COMPREHENSIVE METABOLIC PANEL - Abnormal; Notable for the following components:      Result Value   Sodium 130 (*)    Chloride 97 (*)    BUN 24 (*)    Calcium 7.4 (*)    Total Protein 8.4 (*)    Albumin 2.8 (*)    AST 43 (*)    Total Bilirubin 1.4 (*)    All other components within normal limits  CBC WITH DIFFERENTIAL/PLATELET - Abnormal; Notable for the following components:   WBC 3.3 (*)    RDW 18.7 (*)    nRBC 3.4 (*)    Neutro Abs 0.5 (*)    All other components within normal limits  LIPASE, BLOOD - Abnormal; Notable for the following components:   Lipase 66 (*)    All other components within normal limits  SARS CORONAVIRUS 2 (TAT 6-24 HRS)  PROTIME-INR  LACTIC ACID, PLASMA  POC OCCULT BLOOD, ED  POC OCCULT BLOOD, ED  I-STAT CHEM 8, ED  TYPE AND SCREEN    EKG EKG Interpretation  Date/Time:  Wednesday June 20 2020 15:22:55 EDT Ventricular Rate:  75 PR Interval:    QRS  Duration: 163 QT Interval:  462 QTC Calculation: 517 R Axis:   -92 Text Interpretation: Sinus rhythm Ventricular premature complex RBBB and LAFB No significant change since last tracing Confirmed by Fredia Sorrow [phone]) on 06/20/2020 5:34:03 PM   Radiology DG Chest Port 1 View  Result Date: 06/20/2020 CLINICAL DATA:  Abdominal pain, rectal bleeding since Sunday, cough, COPD, smoker EXAM: PORTABLE CHEST 1 VIEW COMPARISON:  Portable exam 1507 hours compared to 06/10/2020 FINDINGS: Normal heart size, mediastinal contours, and pulmonary vascularity. Emphysematous and bronchitic changes consistent with COPD. Scattered interstitial prominence in both lungs, slightly increased on LEFT since earlier study, question minimal atypical infection. No pleural effusion or pneumothorax. Bones demineralized with old fractures of the posterolateral RIGHT sixth and seventh ribs again noted. IMPRESSION: COPD changes with slightly increased interstitial markings especially in LEFT lung since previous exam, could represent question atypical infection. Electronically Signed   By: Lavonia Dana M.D.   On: 06/20/2020 15:31    Procedures Procedures   Medications Ordered in ED Medications  pantoprazole (PROTONIX) injection 40 mg (40 mg Intravenous Given 06/20/20 1413)  sodium chloride 0.9 % bolus 1,000 mL (0 mLs Intravenous Stopped 06/20/20 1719)  albuterol (VENTOLIN HFA) 108 (90 Base) MCG/ACT inhaler 2 puff (2 puffs Inhalation Given 06/20/20 1638)  iohexol (OMNIPAQUE) 300 MG/ML solution 100 mL (100 mLs Intravenous Contrast Given 06/20/20 1624)  azithromycin (ZITHROMAX) tablet 500 mg (500 mg Oral Given 06/20/20 1731)    ED Course  I have reviewed the triage vital signs and the nursing notes.  Pertinent labs & imaging results that were available during my care of the patient were reviewed by me and considered in my medical decision making (see chart for details).    MDM Rules/Calculators/A&P                          Initial impression-patient presents with abdominal pain and rectal bleeding.  He is alert, does not appear in acute distress, vital signs are reassuring.  Concern for possible perforated stomach ulcer versus lower GI bleed will obtain GI work-up, start patient on fluids, Protonix, and  reevaluate.  Work-up-CBC unremarkable, CMP shows hyponatremia 130, BUN elevated 24, Albumin 2.8.  Baseline, elevated T bili 1.4.  Lipase 66 at baseline for patient.  Prothrombin time 14.2, INR 1.1, lactic 1.8, Hemoccult negative.  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  Chest x-ray shows COPD changes slightly increased interstitial markings/in the left lung could represent atypical infection.  Reassessment patient was reassessed, after providing him with fluids has no complaints at this time, vital signs remained stable.  Patient was updated on lab and imaging, has no complaints at this time, vital signs remained stable, patient is agreeable for discharge.  Rule out-I have low suspicion for symptomatic anemia as hemoglobin is within normal limits.  Low suspicion for perforated stomach ulcer as abdomen is nondistended, no peritoneal sign, imaging negative for acute findings.  Low suspicion for lower GI bleeding as there is no peritoneal sign noted on abdomen pelvis, Hemoccult was negative.  low suspicion for gallbladder abnormality as there is no elevation in liver enzymes or alk phos, no right upper quadrant pain.  Low suspicion for diverticulitis as CT scan was negative, no leukocytosis, vital signs reassuring.  Low suspicion for ACS as patient denies chest pain, shortness of breath, no signs of hypoperfusion fluid or overload on exam, EKG sinus without signs of ischemia.  I have low suspicion for systemic infection as patient is nontoxic-appearing, vital signs  reassuring, no obvious source infection on exam.  Patient does have noted worsening interstitial lung markings seen on x-ray I suspect this may be the start of a of atypical infection will start him on azithromycin and have him follow with PCP for further evaluation.  Plan-I suspect weakness and fatigue secondary due to atypical infection within the lungs, will start him on antibiotics and steroids due to COPD history and follow-up with PCP in 1 week's time for reevaluation.  Vital signs have remained stable, no indication for hospital admission.  Patient discussed with attending and they agreed with assessment and plan.  Patient given at home care as well strict return precautions.  Patient verbalized that they understood agreed to said plan.   Final Clinical Impression(s) / ED Diagnoses Final diagnoses:  Generalized abdominal pain  Upper respiratory tract infection, unspecified type    Rx / DC Orders ED Discharge Orders         Ordered    azithromycin (ZITHROMAX) 250 MG tablet  Daily        06/20/20 1754    predniSONE (DELTASONE) 20 MG tablet  Daily        06/20/20 1754           Aron Baba 06/20/20 1800    Luna Fuse, MD 06/27/20 [phone]

## 2020-06-20 NOTE — ED Triage Notes (Addendum)
Pt reports rectal bleed since Saturday. Presenting from [place] for second opinion. Wears 2L o2 at home but sts he does not wear any when he leaves the house.

## 2020-06-21 LAB — CBC WITH DIFFERENTIAL/PLATELET
Abs Immature Granulocytes: 0.02 10*3/uL (ref 0.00–0.07)
Basophils Absolute: 0 10*3/uL (ref 0.0–0.1)
Basophils Relative: 0 %
Eosinophils Absolute: 0 10*3/uL (ref 0.0–0.5)
Eosinophils Relative: 0 %
HCT: 40.2 % (ref 39.0–52.0)
Hemoglobin: 12.8 g/dL — ABNORMAL LOW (ref 13.0–17.0)
Immature Granulocytes: 1 %
Lymphocytes Relative: 61 %
Lymphs Abs: 1.4 10*3/uL (ref 0.7–4.0)
MCH: 27.4 pg (ref 26.0–34.0)
MCHC: 31.8 g/dL (ref 30.0–36.0)
MCV: 86.1 fL (ref 80.0–100.0)
Monocytes Absolute: 0.3 10*3/uL (ref 0.1–1.0)
Monocytes Relative: 13 %
Neutro Abs: 0.6 10*3/uL — ABNORMAL LOW (ref 1.7–7.7)
Neutrophils Relative %: 25 %
Platelets: 168 10*3/uL (ref 150–400)
RBC: 4.67 MIL/uL (ref 4.22–5.81)
RDW: 18.2 % — ABNORMAL HIGH (ref 11.5–15.5)
WBC: 2.3 10*3/uL — ABNORMAL LOW (ref 4.0–10.5)
nRBC: 4.3 % — ABNORMAL HIGH (ref 0.0–0.2)

## 2020-06-21 LAB — POC OCCULT BLOOD, ED: Fecal Occult Bld: NEGATIVE

## 2020-06-21 LAB — SARS CORONAVIRUS 2 (TAT 6-24 HRS): SARS Coronavirus 2: NEGATIVE

## 2020-06-21 MED ORDER — HYDROCODONE-ACETAMINOPHEN 5-325 MG PO TABS
2.0000 | ORAL_TABLET | Freq: Once | ORAL | Status: AC
  Administered 2020-06-21: 2 via ORAL
  Filled 2020-06-21: qty 2

## 2020-06-21 NOTE — ED Notes (Signed)
Pt requested pain medication. Dr Stark Jock made aware.

## 2020-06-21 NOTE — Discharge Instructions (Addendum)
Continue medications as previously prescribed.  Follow-up with gastroenterology in the next few days.  The contact information for the gastroenterologist in [place] has been provided in this discharge summary for you to call and make these arrangements.

## 2020-06-22 LAB — TYPE AND SCREEN
ABO/RH(D): A POS
Antibody Screen: NEGATIVE

## 2020-06-25 ENCOUNTER — Encounter: Payer: Self-pay | Admitting: Nurse Practitioner

## 2020-06-29 ENCOUNTER — Other Ambulatory Visit: Payer: Self-pay

## 2020-06-29 ENCOUNTER — Emergency Department (HOSPITAL_COMMUNITY): Payer: Medicaid Other

## 2020-06-29 ENCOUNTER — Inpatient Hospital Stay (HOSPITAL_COMMUNITY)
Admission: EM | Admit: 2020-06-29 | Discharge: 2020-07-01 | DRG: 871 | Disposition: A | Discharge status: Home / Self Care | Payer: Medicaid Other | Attending: Internal Medicine | Admitting: Internal Medicine

## 2020-06-29 ENCOUNTER — Encounter (HOSPITAL_COMMUNITY): Payer: Self-pay | Admitting: Emergency Medicine

## 2020-06-29 DIAGNOSIS — J9621 Acute and chronic respiratory failure with hypoxia: Secondary | ICD-10-CM | POA: Diagnosis present

## 2020-06-29 DIAGNOSIS — Z9981 Dependence on supplemental oxygen: Secondary | ICD-10-CM

## 2020-06-29 DIAGNOSIS — I5032 Chronic diastolic (congestive) heart failure: Secondary | ICD-10-CM

## 2020-06-29 DIAGNOSIS — A419 Sepsis, unspecified organism: Secondary | ICD-10-CM | POA: Diagnosis present

## 2020-06-29 DIAGNOSIS — M059 Rheumatoid arthritis with rheumatoid factor, unspecified: Secondary | ICD-10-CM | POA: Diagnosis not present

## 2020-06-29 DIAGNOSIS — F17210 Nicotine dependence, cigarettes, uncomplicated: Secondary | ICD-10-CM | POA: Diagnosis present

## 2020-06-29 DIAGNOSIS — R630 Anorexia: Secondary | ICD-10-CM | POA: Diagnosis present

## 2020-06-29 DIAGNOSIS — J440 Chronic obstructive pulmonary disease with acute lower respiratory infection: Secondary | ICD-10-CM | POA: Diagnosis present

## 2020-06-29 DIAGNOSIS — Z79899 Other long term (current) drug therapy: Secondary | ICD-10-CM | POA: Diagnosis not present

## 2020-06-29 DIAGNOSIS — M0500 Felty's syndrome, unspecified site: Secondary | ICD-10-CM | POA: Diagnosis present

## 2020-06-29 DIAGNOSIS — Z20822 Contact with and (suspected) exposure to covid-19: Secondary | ICD-10-CM | POA: Diagnosis present

## 2020-06-29 DIAGNOSIS — Z888 Allergy status to other drugs, medicaments and biological substances status: Secondary | ICD-10-CM

## 2020-06-29 DIAGNOSIS — J441 Chronic obstructive pulmonary disease with (acute) exacerbation: Secondary | ICD-10-CM | POA: Diagnosis present

## 2020-06-29 DIAGNOSIS — Z825 Family history of asthma and other chronic lower respiratory diseases: Secondary | ICD-10-CM | POA: Diagnosis not present

## 2020-06-29 DIAGNOSIS — Z9081 Acquired absence of spleen: Secondary | ICD-10-CM

## 2020-06-29 DIAGNOSIS — E43 Unspecified severe protein-calorie malnutrition: Secondary | ICD-10-CM

## 2020-06-29 DIAGNOSIS — J9601 Acute respiratory failure with hypoxia: Secondary | ICD-10-CM

## 2020-06-29 DIAGNOSIS — Z8249 Family history of ischemic heart disease and other diseases of the circulatory system: Secondary | ICD-10-CM

## 2020-06-29 DIAGNOSIS — E872 Acidosis: Secondary | ICD-10-CM | POA: Diagnosis present

## 2020-06-29 DIAGNOSIS — J449 Chronic obstructive pulmonary disease, unspecified: Secondary | ICD-10-CM | POA: Diagnosis not present

## 2020-06-29 DIAGNOSIS — J189 Pneumonia, unspecified organism: Secondary | ICD-10-CM | POA: Diagnosis present

## 2020-06-29 DIAGNOSIS — E440 Moderate protein-calorie malnutrition: Secondary | ICD-10-CM | POA: Diagnosis present

## 2020-06-29 DIAGNOSIS — R6521 Severe sepsis with septic shock: Secondary | ICD-10-CM

## 2020-06-29 DIAGNOSIS — Z6824 Body mass index (BMI) 24.0-24.9, adult: Secondary | ICD-10-CM

## 2020-06-29 LAB — COMPREHENSIVE METABOLIC PANEL
ALT: 13 U/L (ref 0–44)
AST: 39 U/L (ref 15–41)
Albumin: 2.4 g/dL — ABNORMAL LOW (ref 3.5–5.0)
Alkaline Phosphatase: 92 U/L (ref 38–126)
Anion gap: 9 (ref 5–15)
BUN: 13 mg/dL (ref 6–20)
CO2: 22 mmol/L (ref 22–32)
Calcium: 7.7 mg/dL — ABNORMAL LOW (ref 8.9–10.3)
Chloride: 101 mmol/L (ref 98–111)
Creatinine, Ser: 0.64 mg/dL (ref 0.61–1.24)
GFR, Estimated: >60 mL/min (ref >60)
Glucose, Bld: 100 mg/dL — ABNORMAL HIGH (ref 70–99)
Potassium: 4.2 mmol/L (ref 3.5–5.1)
Sodium: 132 mmol/L — ABNORMAL LOW (ref 135–145)
Total Bilirubin: 1.3 mg/dL — ABNORMAL HIGH (ref 0.3–1.2)
Total Protein: 7.2 g/dL (ref 6.5–8.1)

## 2020-06-29 LAB — CBC WITH DIFFERENTIAL/PLATELET
Basophils Absolute: 0.1 10*3/uL (ref 0.0–0.1)
Basophils Relative: 2 %
Eosinophils Absolute: 0 10*3/uL (ref 0.0–0.5)
Eosinophils Relative: 0 %
HCT: 46 % (ref 39.0–52.0)
Hemoglobin: 14.3 g/dL (ref 13.0–17.0)
Lymphocytes Relative: 86 %
Lymphs Abs: 3.4 10*3/uL (ref 0.7–4.0)
MCH: 27.3 pg (ref 26.0–34.0)
MCHC: 31.1 g/dL (ref 30.0–36.0)
MCV: 87.8 fL (ref 80.0–100.0)
Monocytes Absolute: 0.2 10*3/uL (ref 0.1–1.0)
Monocytes Relative: 6 %
Neutro Abs: 0.2 10*3/uL — CL (ref 1.7–7.7)
Neutrophils Relative %: 6 %
Platelets: 129 10*3/uL — ABNORMAL LOW (ref 150–400)
RBC: 5.24 MIL/uL (ref 4.22–5.81)
RDW: 19.9 % — ABNORMAL HIGH (ref 11.5–15.5)
WBC: 4 10*3/uL (ref 4.0–10.5)
nRBC: 1.8 % — ABNORMAL HIGH (ref 0.0–0.2)

## 2020-06-29 LAB — URINALYSIS, ROUTINE W REFLEX MICROSCOPIC
Bacteria, UA: NONE SEEN
Bilirubin Urine: NEGATIVE
Glucose, UA: NEGATIVE mg/dL
Hgb urine dipstick: NEGATIVE
Ketones, ur: 5 mg/dL — AB
Leukocytes,Ua: NEGATIVE
Nitrite: NEGATIVE
Protein, ur: 30 mg/dL — AB
Specific Gravity, Urine: 1.024 (ref 1.005–1.030)
pH: 5 (ref 5.0–8.0)

## 2020-06-29 LAB — PROTIME-INR
INR: 1.1 (ref 0.8–1.2)
Prothrombin Time: 13.7 seconds (ref 11.4–15.2)

## 2020-06-29 LAB — RESP PANEL BY RT-PCR (FLU A&B, COVID) ARPGX2
Influenza A by PCR: NEGATIVE
Influenza B by PCR: NEGATIVE
SARS Coronavirus 2 by RT PCR: NEGATIVE

## 2020-06-29 LAB — PROCALCITONIN: Procalcitonin: 0.11 ng/mL

## 2020-06-29 LAB — LACTIC ACID, PLASMA
Lactic Acid, Venous: 2.1 mmol/L (ref 0.5–1.9)
Lactic Acid, Venous: 2.1 mmol/L (ref 0.5–1.9)

## 2020-06-29 LAB — APTT: aPTT: 29 seconds (ref 24–36)

## 2020-06-29 IMAGING — DX DG CHEST 1V PORT
1 series · 1 of 1 positions shown · non-contrast
Comparison: [DATE]

CLINICAL DATA: Fever and shortness of breath

EXAM:
PORTABLE CHEST 1 VIEW

[chest ap]
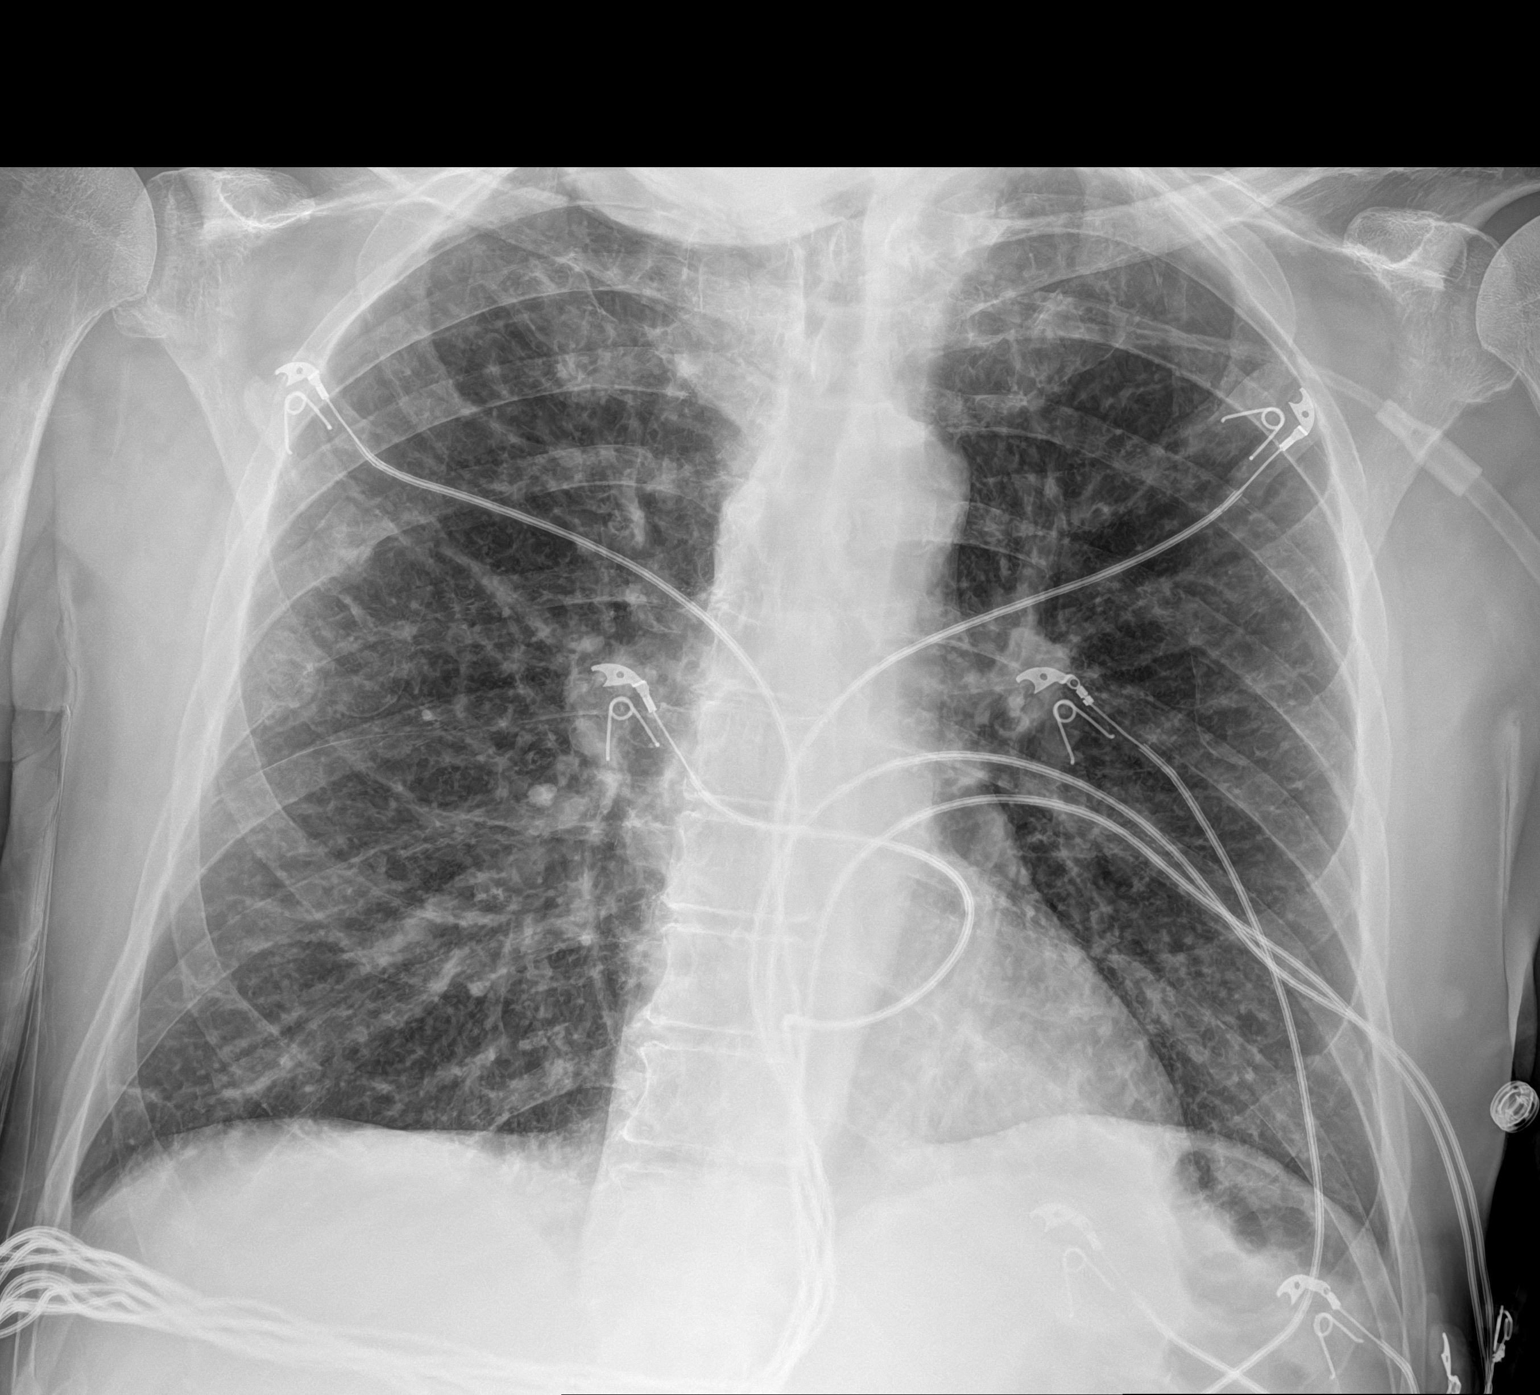

[1 of 1 positions shown; findings below may reference images not displayed]

FINDINGS: Diffuse interstitial thickening persists. No edema or airspace
opacity. The heart size and pulmonary vascularity are normal. No
adenopathy. No bone lesions.
IMPRESSION: Diffuse interstitial thickening, likely due to chronic bronchitis.
There may be a degree of underlying fibrosis. No edema or airspace
opacity. Heart size normal.

## 2020-06-29 MED ORDER — SODIUM CHLORIDE 0.9 % IV SOLN
2.0000 g | INTRAVENOUS | Status: DC
  Administered 2020-06-29: 2 g via INTRAVENOUS
  Filled 2020-06-29: qty 20

## 2020-06-29 MED ORDER — ACETAMINOPHEN 500 MG PO TABS
1000.0000 mg | ORAL_TABLET | Freq: Once | ORAL | Status: AC
  Administered 2020-06-29: 1000 mg via ORAL
  Filled 2020-06-29: qty 2

## 2020-06-29 MED ORDER — SODIUM CHLORIDE 0.9 % IV BOLUS
2500.0000 mL | Freq: Once | INTRAVENOUS | Status: AC
  Administered 2020-06-29: 2500 mL via INTRAVENOUS

## 2020-06-29 MED ORDER — IPRATROPIUM-ALBUTEROL 0.5-2.5 (3) MG/3ML IN SOLN
3.0000 mL | Freq: Four times a day (QID) | RESPIRATORY_TRACT | Status: DC
  Administered 2020-06-29: 3 mL via RESPIRATORY_TRACT
  Filled 2020-06-29: qty 3

## 2020-06-29 MED ORDER — ONDANSETRON HCL 4 MG PO TABS: 4.0000 mg | ORAL_TABLET | Freq: Four times a day (QID) | ORAL | Status: DC | PRN

## 2020-06-29 MED ORDER — ALBUTEROL SULFATE (2.5 MG/3ML) 0.083% IN NEBU
2.5000 mg | INHALATION_SOLUTION | RESPIRATORY_TRACT | Status: DC | PRN
  Administered 2020-06-29: 2.5 mg via RESPIRATORY_TRACT
  Filled 2020-06-29: qty 3

## 2020-06-29 MED ORDER — GUAIFENESIN-DM 100-10 MG/5ML PO SYRP
5.0000 mL | ORAL_SOLUTION | ORAL | Status: DC | PRN
  Administered 2020-06-29: 5 mL via ORAL
  Filled 2020-06-29: qty 5

## 2020-06-29 MED ORDER — LACTATED RINGERS IV SOLN: INTRAVENOUS | Status: DC

## 2020-06-29 MED ORDER — METHYLPREDNISOLONE SODIUM SUCC 125 MG IJ SOLR
125.0000 mg | Freq: Once | INTRAMUSCULAR | Status: AC
  Administered 2020-06-29: 125 mg via INTRAVENOUS
  Filled 2020-06-29: qty 2

## 2020-06-29 MED ORDER — ENOXAPARIN SODIUM 40 MG/0.4ML ~~LOC~~ SOLN
40.0000 mg | SUBCUTANEOUS | Status: DC
  Administered 2020-06-29 – 2020-06-30 (×2): 40 mg via SUBCUTANEOUS
  Filled 2020-06-29 (×2): qty 0.4

## 2020-06-29 MED ORDER — ONDANSETRON HCL 4 MG/2ML IJ SOLN: 4.0000 mg | Freq: Four times a day (QID) | INTRAMUSCULAR | Status: DC | PRN

## 2020-06-29 MED ORDER — MOMETASONE FURO-FORMOTEROL FUM 100-5 MCG/ACT IN AERO
2.0000 | INHALATION_SPRAY | Freq: Two times a day (BID) | RESPIRATORY_TRACT | Status: DC
  Administered 2020-06-29 – 2020-07-01 (×4): 2 via RESPIRATORY_TRACT
  Filled 2020-06-29: qty 8.8

## 2020-06-29 MED ORDER — SODIUM CHLORIDE 0.9 % IV SOLN
500.0000 mg | INTRAVENOUS | Status: DC
  Administered 2020-06-29 – 2020-06-30 (×2): 500 mg via INTRAVENOUS
  Filled 2020-06-29 (×2): qty 500

## 2020-06-29 MED ORDER — METHYLPREDNISOLONE SODIUM SUCC 125 MG IJ SOLR
60.0000 mg | Freq: Four times a day (QID) | INTRAMUSCULAR | Status: DC
  Administered 2020-06-29 – 2020-06-30 (×3): 60 mg via INTRAVENOUS
  Filled 2020-06-29 (×3): qty 2

## 2020-06-29 NOTE — ED Notes (Signed)
Meal and soda provided.

## 2020-06-29 NOTE — ED Provider Notes (Signed)
[facility] EMERGENCY DEPARTMENT Provider Note   CSN: [id number] Arrival date & time: 06/29/20  1447     History Chief Complaint  Patient presents with  . Shortness of Breath  . Fever    Marcus [name] is a 60 y.o. male.  HPI   This patient is a chronically ill 60 year old male who has known history of COPD on chronic oxygen therapy at home.  The patient continues to smoke cigarettes.  He arrives today by ambulance transport after being found to be severely dyspneic, he has had a fever for at least 2 days and has had increasing work of breathing, frequent coughing and weakness.  The patient has had a poor appetite, he has not taken any medications for his fever, he has not been using his breathing medications as far as he is able to tell us.  He lives with his 57+-year-old mother.  Paramedics report that they gave the patient bronchodilators and Solu-Medrol in route to the hospital for his increasing work of breathing shortness of breath and hypoxia.  On arrival the patient is breathing 30 to 35 breaths/min with a pulse of 130 a fever of 102 and is ill-appearing.  Level 5 caveat applies secondary to what appears to be some altered mental status in association with his sepsis syndrome.  Past Medical History:  Diagnosis Date  . COPD (chronic obstructive pulmonary disease) ([place])   . 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 Hernia, epigastric   . Pancytopenia ([place])   . Seropositive rheumatoid arthritis ([place])   . Tobacco use disorder     Patient Active Problem List   Diagnosis Date Noted  . COPD exacerbation ([place]) 06/11/2020  . Chest pain 06/11/2020  . Cellulitis 06/11/2020  . Chronic diastolic CHF (congestive heart failure) ([place]) 06/11/2020  . Severe sepsis with septic shock ([place]) 01/20/2020  . Acute hypoxemic respiratory failure ([place]) 01/20/2020  . Hypokalemia 01/20/2020  . Perforated prepyloric ulcer 01/18/2020 01/18/2020  . Pneumoperitoneum 01/17/2020  . COPD with acute exacerbation ([place])  08/18/2019  . 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 Anemia 11/14/2015  . Seropositive rheumatoid arthritis ([place])   . Chronic obstructive pulmonary disease ([place])   . Protein-calorie malnutrition, severe 07/24/2015  . Pancytopenia ([place]) 07/23/2015  . 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 Leukopenia 10/21/2012  . 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 Abnormal EKG 10/21/2012  . Joint pain 10/21/2012  . Splenomegaly 10/21/2012    Past Surgical History:  Procedure Laterality Date  . INCISIONAL HERNIA REPAIR  01/17/2020   Procedure: HERNIA REPAIR INCISIONAL AND Silvestre Gunner;  Surgeon: Kinsinger, Arta Bruce, MD;  Location: [facility] ORS;  Service: General;;  . LAPAROTOMY N/A 01/17/2020   Procedure: EXPLORATORY LAPAROTOMY;  Surgeon: Kieth Brightly Arta Bruce, MD;  Location: [facility];  Service: General;  Laterality: N/A;       Family History  Problem Relation Age of Onset  . CAD Brother   . COPD Brother     Social History   Tobacco Use  . Smoking status: Current Every Day Smoker    Packs/day: 1.00    Types: Cigarettes  . Smokeless tobacco: Never Used  Substance Use Topics  . Alcohol use: No    Alcohol/week: 0.0 standard drinks  . Drug use: Yes    Types: Marijuana    Home Medications Prior to Admission medications   Medication Sig Start Date End Date Taking? Authorizing Provider  albuterol (PROVENTIL) (2.5 MG/3ML) 0.083% nebulizer solution Take 3 mLs (2.5 mg total) by nebulization every 6 (six) hours  as needed for wheezing or shortness of breath. 01/24/20   Kinsinger, Arta Bruce, MD  mometasone-formoterol (DULERA) 100-5 MCG/ACT AERO Inhale 2 puffs into the lungs 2 (two) times daily. 06/12/20   Bonnielee Haff, MD    Allergies    Levaquin [levofloxacin in d5w] and Methotrexate  Review of Systems   Review of Systems  Unable to perform ROS: Severe respiratory distress    Physical Exam Updated Vital Signs BP (!) 95/58   Pulse (!) 109   Temp 98.2 F (36.8 C) (Oral)   Resp (!) 39   Ht 1.778 m (5'  10")   Wt 78 kg   SpO2 90%   BMI 24.68 kg/m   Physical Exam Constitutional:      Comments: Ill-appearing, tachypneic  HENT:     Head:     Comments: No signs of trauma    Mouth/Throat:     Comments: Dry mucous membrane Eyes:     Comments: Clear conjunctive a, no jaundice, normal pupils  Neck:     Comments: Supple neck, no JVD or lymphadenopathy Cardiovascular:     Comments: Tachycardic to 130, good pulses, no edema Pulmonary:     Comments: Increased work of breathing, diffuse expiratory wheezing, very little air movement, decreased lung sounds at the bases, occasional coughing, rhonchorous sounds present, very tachypneic Chest:     Comments: No tenderness or deformity over the chest Abdominal:     Comments: Soft nontender  Musculoskeletal:     Comments: No edema, soft compartments  Skin:    General: Skin is dry.     Comments: No specific rashes, warm to the touch  Neurological:     Comments: The patient is somnolent but easily arousable and answers questions, he speaks in very short and sentences due to respiratory distress, cannot answer all my questions, has some confusion, knows he goes into [facility], does not know who his doctor is  Psychiatric:     Comments: Appropriate affect given circumstances     ED Results / Procedures / Treatments   Labs (all labs ordered are listed, but only abnormal results are displayed) Labs Reviewed  LACTIC ACID, PLASMA - Abnormal; Notable for the following components:      Result Value   Lactic Acid, Venous 2.1 (*)    All other components within normal limits  COMPREHENSIVE METABOLIC PANEL - Abnormal; Notable for the following components:   Sodium 132 (*)    Glucose, Bld 100 (*)    Calcium 7.7 (*)    Albumin 2.4 (*)    Total Bilirubin 1.3 (*)    All other components within normal limits  CBC WITH DIFFERENTIAL/PLATELET - Abnormal; Notable for the following components:   RDW 19.9 (*)    Platelets 129 (*)    nRBC 1.8 (*)     Neutro Abs 0.2 (*)    All other components within normal limits  RESP PANEL BY RT-PCR (FLU A&B, COVID) [id number]  CULTURE, BLOOD (ROUTINE X 2)  CULTURE, BLOOD (ROUTINE X 2)  URINE CULTURE  PROTIME-INR  APTT  LACTIC ACID, PLASMA  URINALYSIS, ROUTINE W REFLEX MICROSCOPIC    EKG EKG Interpretation  Date/Time:  Friday June 29 2020 15:46:19 EDT Ventricular Rate:  109 PR Interval:    QRS Duration: 146 QT Interval:  369 QTC Calculation: 497 R Axis:   -82 Text Interpretation: Sinus tachycardia RBBB and LAFB Since last tracing rate faster Confirmed by Noemi Chapel [phone]) on 06/29/2020 4:10:34 PM   Radiology DG Chest  Port 1 View  Result Date: 06/29/2020 CLINICAL DATA:  Fever and shortness of breath EXAM: PORTABLE CHEST 1 VIEW COMPARISON:  June 20, 2020 FINDINGS: Diffuse interstitial thickening persists. No edema or airspace opacity. The heart size and pulmonary vascularity are normal. No adenopathy. No bone lesions. IMPRESSION: Diffuse interstitial thickening, likely due to chronic bronchitis. There may be a degree of underlying fibrosis. No edema or airspace opacity. Heart size normal. Electronically Signed   By: Lowella Grip III M.D.   On: 06/29/2020 15:48    Procedures .Critical Care Performed by: Noemi Chapel, MD Authorized by: Noemi Chapel, MD   Critical care provider statement:    Critical care time (minutes):  35   Critical care time was exclusive of:  Separately billable procedures and treating other patients and teaching time   Critical care was necessary to treat or prevent imminent or life-threatening deterioration of the following conditions:  Respiratory failure and sepsis   Critical care was time spent personally by me on the following activities:  Blood draw for specimens, development of treatment plan with patient or surrogate, discussions with consultants, evaluation of patient's response to treatment, examination of patient, obtaining history from patient or  surrogate, ordering and performing treatments and interventions, ordering and review of laboratory studies, ordering and review of radiographic studies, pulse oximetry, re-evaluation of patient's condition and review of old charts     Medications Ordered in ED Medications  lactated ringers infusion ( Intravenous New Bag/Given 06/29/20 1529)  cefTRIAXone (ROCEPHIN) 2 g in sodium chloride 0.9 % 100 mL IVPB (0 g Intravenous Stopped 06/29/20 1621)  azithromycin (ZITHROMAX) 500 mg in sodium chloride 0.9 % 250 mL IVPB (0 mg Intravenous Stopped 06/29/20 1640)  acetaminophen (TYLENOL) tablet 1,000 mg (1,000 mg Oral Given 06/29/20 1534)  sodium chloride 0.9 % bolus 2,500 mL (2,500 mLs Intravenous New Bag/Given 06/29/20 1639)    ED Course  I have reviewed the triage vital signs and the nursing notes.  Pertinent labs & imaging results that were available during my care of the patient were reviewed by me and considered in my medical decision making (see chart for details).  Clinical Course as of 06/29/20 1723  Fri Jun 29, 2020  1608 I have personally viewed the patient's chest x-ray, there appears to be diffuse interstitial thickening, I do not see any focal infiltrates, patchy infiltrates or abnormal mediastinum, no pneumothorax. [BM]  1608 Lab work shows no leukocytosis, it does show that the patient has a mild hyponatremia, low albumin at 2.4, no anemia. [BM]  9470 Vital signs reflect that the patient has had a decrease in heart rate from 130 down to 110 however the blood pressure is dropped to 95/58.  Lactic acid is pending, the patient has a MAP above 60 at this time.  Currently the patient is getting IV fluids but not getting bolus at this time as the patient does not yet meet criteria for septic shock [BM]  1609 NEUT#(!!): 0.2 [BM]  1611 NEUT#(!!): 0.2 [BM]  1611 Absolute neutrophil count has been low, this is similar to what he has had over the last [BM]  1614 Lactic acid is 2.1, INR is 1.1, patient  will need to be admitted to the hospital.  Assumption is that this is pulmonary in origin given his increasing tachypnea.  Covid test pending  [BM]  1630 The patient has had a blood pressure that is 70/40, he is now into a shock range and will need bolus of fluids, this has  been ordered, 2.5 L [BM]    Clinical Course User Index [BM] Noemi Chapel, MD   MDM Rules/Calculators/A&P                          This patient is ill-appearing, respiratory distress, sepsis syndrome with multiple different inflammatory response criteria.  I suspect it is pulmonary sepsis, he is 89% on 5 L by nasal cannula, he tells me that he would want to be intubated if such a thing were to be needed.  Chest x-ray pending, labs pending, I placed an IV in the patient's left hand as there was no other access, he did have a right hand access but needed 2 large-bore IVs as well as bloods and cultures which we were able to draw.  Antibiotics have been ordered, code sepsis activated, this patient is critically ill and will need to be admitted to the hospital.  Test for Covid, bronchodilator therapy can be ongoing as needed but at this time there is more decreased air movement and there is wheezing  Labs otherwise do not reveal any specific abnormalities including the Covid testing which is negative, flu testing which is negative, the patient has improved with IV fluids  Discussed the case with Dr. Ursula Alert who will admit.  Patient is critically ill with septic shock, unknown source but likely pulmonary  Final Clinical Impression(s) / ED Diagnoses Final diagnoses:  Septic shock ([place])  Acute respiratory failure with hypoxia ([facility])      Noemi Chapel, MD 06/29/20 1723

## 2020-06-29 NOTE — ED Notes (Signed)
ED Provider at bedside. 

## 2020-06-29 NOTE — H&P (Signed)
History and Physical  Marcus [name] KYH:[id number] DOB: May 25, 1960 DOA: 06/29/2020  Referring physician: Noemi Chapel, MD,  EDP PCP: Patient, No Pcp Per  Outpatient Specialists:   Patient Coming From: home  Chief Complaint: fever  HPI: Marcus [name] is a 60 y.o. 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  Was brought to the hospital via EMS due to fevers, increasing work of breathing, frequent cough and weakness for the past 2 days.  He has not taken any medication for his fevers.  He has had a poor appetite over the past couple of days.  Shortness of breath is worse with exertion and improved with rest. A home health nurse saw him earlier today and called EMS to bring him for evaluation.    Emergency Department Course: 102.  Became hypotensive in the ED to the 90s and was given fluids with good response.  He is tachypneic and tachycardic, which improved with fluid boluses.  Nasal cannula at 4 L.  White count 4.  Chest x-ray shows sternal thickening consistent with chronic bronchitis.  Cultures drawn.  Rocephin and azithromycin started  Review of Systems:   Pt denies any nausea, vomiting, diarrhea, constipation, abdominal pain, palpitations, headache, vision changes, lightheadedness, dizziness, melena, rectal bleeding.  Review of systems are otherwise negative  Past Medical History:  Diagnosis Date  . COPD (chronic obstructive pulmonary disease) ([place])   . 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 Hernia, epigastric   . Pancytopenia ([place])   . Seropositive rheumatoid arthritis ([place])   . Tobacco use disorder    Past Surgical History:  Procedure Laterality Date  . INCISIONAL HERNIA REPAIR  01/17/2020   Procedure: HERNIA REPAIR INCISIONAL AND Silvestre Gunner;  Surgeon: Kinsinger, Arta Bruce, MD;  Location: [facility];  Service: General;;  . LAPAROTOMY N/A 01/17/2020   Procedure: EXPLORATORY LAPAROTOMY;  Surgeon:  Kieth Brightly Arta Bruce, MD;  Location: [facility];  Service: General;  Laterality: N/A;   Social History:  reports that he has been smoking cigarettes. He has been smoking about 1.00 pack per day. He has never used smokeless tobacco. He reports current drug use. Drug: Marijuana. He reports that he does not drink alcohol. Patient lives at home with 35 year old mother  Allergies  Allergen Reactions  . Levaquin [Levofloxacin In D5w] Other (See Comments)    Chest pain  . Methotrexate Other (See Comments)    Chest pain    Family History  Problem Relation Age of Onset  . CAD Brother   . COPD Brother       Prior to Admission medications   Medication Sig Start Date End Date Taking? Authorizing Provider  albuterol (PROVENTIL) (2.5 MG/3ML) 0.083% nebulizer solution Take 3 mLs (2.5 mg total) by nebulization every 6 (six) hours as needed for wheezing or shortness of breath. 01/24/20  Yes Kinsinger, Arta Bruce, MD  mometasone-formoterol (DULERA) 100-5 MCG/ACT AERO Inhale 2 puffs into the lungs 2 (two) times daily. 06/12/20  Yes Bonnielee Haff, MD    Physical Exam: BP (!) 96/59   Pulse 75   Temp 98.2 F (36.8 C) (Oral)   Resp 20   Ht 5\' 10"  (1.778 m)   Wt 78 kg   SpO2 95%   BMI 24.68 kg/m   . General: Ill-appearing middle-aged male. Awake and alert and oriented x3. No acute cardiopulmonary distress.  [name] HEENT: Normocephalic atraumatic.  Right and left ears normal in appearance.  Pupils equal, round, reactive to light. Extraocular muscles are intact. Sclerae anicteric and noninjected.  Moist mucosal membranes. No mucosal lesions.  . Neck: Neck supple without lymphadenopathy. No carotid bruits. No masses palpated.  . Cardiovascular: Regular rate with normal S1-S2 sounds. No murmurs, rubs, gallops auscultated. No JVD.  [name] Respiratory: Diffuse rhonchi.  Mild wheezing in the left upper lung field.  No accessory muscle use. . Abdomen: Soft, nontender, nondistended. Active bowel sounds. No masses or  hepatosplenomegaly  . Skin: No rashes, lesions, or ulcerations.  Dry, warm to touch. 2+ dorsalis pedis and radial pulses. . Musculoskeletal: No calf or leg pain. All major joints not erythematous nontender.  No upper or lower joint deformation.  Good ROM.  No contractures  . Psychiatric: Intact judgment and insight. Pleasant and cooperative. . Neurologic: No focal neurological deficits. Strength is 5/5 and symmetric in upper and lower extremities.  Cranial nerves II through XII are grossly intact.           Labs on Admission: I have personally reviewed following labs and imaging studies  CBC: Recent Labs  Lab 06/29/20 1524  WBC 4.0  NEUTROABS 0.2*  HGB 14.3  HCT 46.0  MCV 87.8  PLT 102*   Basic Metabolic Panel: Recent Labs  Lab 06/29/20 1524  NA 132*  K 4.2  CL 101  CO2 22  GLUCOSE 100*  BUN 13  CREATININE 0.64  CALCIUM 7.7*   GFR: Estimated Creatinine Clearance: 102.7 mL/min (by C-G formula based on SCr of 0.64 mg/dL). Liver Function Tests: Recent Labs  Lab 06/29/20 1524  AST 39  ALT 13  ALKPHOS 92  BILITOT 1.3*  PROT 7.2  ALBUMIN 2.4*   No results for input(s): LIPASE, AMYLASE in the last 168 hours. No results for input(s): AMMONIA in the last 168 hours. Coagulation Profile: Recent Labs  Lab 06/29/20 1524  INR 1.1   Cardiac Enzymes: No results for input(s): CKTOTAL, CKMB, CKMBINDEX, TROPONINI in the last 168 hours. BNP (last 3 results) No results for input(s): PROBNP in the last 8760 hours. HbA1C: No results for input(s): HGBA1C in the last 72 hours. CBG: No results for input(s): GLUCAP in the last 168 hours. Lipid Profile: No results for input(s): CHOL, HDL, LDLCALC, TRIG, CHOLHDL, LDLDIRECT in the last 72 hours. Thyroid Function Tests: No results for input(s): TSH, T4TOTAL, FREET4, T3FREE, THYROIDAB in the last 72 hours. Anemia Panel: No results for input(s): VITAMINB12, FOLATE, FERRITIN, TIBC, IRON, RETICCTPCT in the last 72 hours. Urine  analysis:    Component Value Date/Time   COLORURINE YELLOW 06/10/2020 1958   APPEARANCEUR CLEAR 06/10/2020 1958   LABSPEC 1.017 06/10/2020 1958   PHURINE 6.0 06/10/2020 1958   GLUCOSEU NEGATIVE 06/10/2020 1958   HGBUR NEGATIVE 06/10/2020 [place] NEGATIVE 06/10/2020 1958   KETONESUR NEGATIVE 06/10/2020 1958   PROTEINUR NEGATIVE 06/10/2020 1958   UROBILINOGEN 0.2 10/21/2012 1737   NITRITE NEGATIVE 06/10/2020 1958   LEUKOCYTESUR NEGATIVE 06/10/2020 1958   Sepsis Labs: @LABRCNTIP (procalcitonin:4,lacticidven:4) ) Recent Results (from the past 240 hour(s))  SARS CORONAVIRUS 2 (TAT 6-24 HRS) Nasopharyngeal Nasopharyngeal Swab     Status: None   Collection Time: 06/20/20  5:52 PM   Specimen: Nasopharyngeal Swab  Result Value Ref Range Status   SARS Coronavirus 2 NEGATIVE NEGATIVE Final    Comment: (NOTE) SARS-CoV-2 target nucleic acids are NOT DETECTED.  The SARS-CoV-2 RNA is generally detectable in upper and lower respiratory specimens during the acute phase of infection. Negative results do not preclude  SARS-CoV-2 infection, do not rule out co-infections with other pathogens, and should not be used as the sole basis for treatment or other patient management decisions. Negative results must be combined with clinical observations, patient history, and epidemiological information. The expected result is Negative.  Fact Sheet for Patients: SugarRoll.be  Fact Sheet for Healthcare Providers: https://www.woods-mathews.com/  This test is not yet approved or cleared by the [place] FDA and  has been authorized for detection and/or diagnosis of SARS-CoV-2 by FDA under an Emergency Use Authorization (EUA). This EUA will remain  in effect (meaning this test can be used) for the duration of the COVID-19 declaration under Se ction 564(b)(1) of the Act, 21 U.S.C. section 360bbb-3(b)(1), unless the authorization is terminated  or revoked sooner.  Performed at [facility], [place] [address] [place], [place] [address]   Blood Culture (routine x 2)     Status: None (Preliminary result)   Collection Time: 06/29/20  3:24 PM   Specimen: BLOOD LEFT WRIST  Result Value Ref Range Status   Specimen Description BLOOD LEFT WRIST  Final   Special Requests   Final    Blood Culture adequate volume BOTTLES DRAWN AEROBIC AND ANAEROBIC Performed at [facility], [address], [place], [place] [address]    Culture PENDING  Incomplete   Report Status PENDING  Incomplete  Blood Culture (routine x 2)     Status: None (Preliminary result)   Collection Time: 06/29/20  3:30 PM   Specimen: BLOOD LEFT ARM  Result Value Ref Range Status   Specimen Description BLOOD LEFT ARM  Final   Special Requests   Final    Blood Culture adequate volume BOTTLES DRAWN AEROBIC AND ANAEROBIC Performed at [facility], [address], [place], [place] [address]    Culture PENDING  Incomplete   Report Status PENDING  Incomplete  Resp Panel by RT-PCR (Flu A&B, Covid) Nasopharyngeal Swab     Status: None   Collection Time: 06/29/20  3:40 PM   Specimen: Nasopharyngeal Swab; Nasopharyngeal(NP) swabs in vial transport medium  Result Value Ref Range Status   SARS Coronavirus 2 by RT PCR NEGATIVE NEGATIVE Final    Comment: (NOTE) SARS-CoV-2 target nucleic acids are NOT DETECTED.  The SARS-CoV-2 RNA is generally detectable in upper respiratory specimens during the acute phase of infection. The lowest concentration of SARS-CoV-2 viral copies this assay can detect is 138 copies/mL. A negative result does not preclude SARS-Cov-2 infection and should not be used as the sole basis for treatment or other patient management decisions. A negative result may occur with  improper specimen collection/handling, submission of specimen other than nasopharyngeal swab, presence of viral mutation(s) within the areas targeted by this assay, and inadequate number  of viral copies(<138 copies/mL). A negative result must be combined with clinical observations, patient history, and epidemiological information. The expected result is Negative.  Fact Sheet for Patients:  EntrepreneurPulse.com.au  Fact Sheet for Healthcare Providers:  IncredibleEmployment.be  This test is no t yet approved or cleared by the [place] FDA and  has been authorized for detection and/or diagnosis of SARS-CoV-2 by FDA under an Emergency Use Authorization (EUA). This EUA will remain  in effect (meaning this test can be used) for the duration of the COVID-19 declaration under Section 564(b)(1) of the Act, 21 U.S.C.section 360bbb-3(b)(1), unless the authorization is terminated  or revoked sooner.       Influenza A by PCR NEGATIVE NEGATIVE Final   Influenza B by PCR NEGATIVE  NEGATIVE Final    Comment: (NOTE) The Xpert Xpress SARS-CoV-2/FLU/RSV plus assay is intended as an aid in the diagnosis of influenza from Nasopharyngeal swab specimens and should not be used as a sole basis for treatment. Nasal washings and aspirates are unacceptable for Xpert Xpress SARS-CoV-2/FLU/RSV testing.  Fact Sheet for Patients: EntrepreneurPulse.com.au  Fact Sheet for Healthcare Providers: IncredibleEmployment.be  This test is not yet approved or cleared by the [place] FDA and has been authorized for detection and/or diagnosis of SARS-CoV-2 by FDA under an Emergency Use Authorization (EUA). This EUA will remain in effect (meaning this test can be used) for the duration of the COVID-19 declaration under Section 564(b)(1) of the Act, 21 U.S.C. section 360bbb-3(b)(1), unless the authorization is terminated or revoked.  Performed at [facility], [address], [place], [place] [address]      Radiological Exams on Admission: DG Chest [facility] 1 View  Result Date: 06/29/2020 CLINICAL DATA:  Fever and  shortness of breath EXAM: PORTABLE CHEST 1 VIEW COMPARISON:  June 20, 2020 FINDINGS: Diffuse interstitial thickening persists. No edema or airspace opacity. The heart size and pulmonary vascularity are normal. No adenopathy. No bone lesions. IMPRESSION: Diffuse interstitial thickening, likely due to chronic bronchitis. There may be a degree of underlying fibrosis. No edema or airspace opacity. Heart size normal. Electronically Signed   By: Lowella Grip III M.D.   On: 06/29/2020 15:48    EKG: Independently reviewed.  Sinus tachycardia with right bundle branch block and left anterior fascicular block.  No acute ST changes.  Assessment/Plan: Principal Problem:   CAP (community acquired pneumonia) Active Problems:   Protein-calorie malnutrition, severe   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HCC)   Seropositive rheumatoid arthritis ([place])   Chronic obstructive pulmonary disease (HCC)   Severe sepsis with septic shock (HCC)   Chronic diastolic CHF (congestive heart failure) ([place])   Septic shock ([place])    This patient was discussed with the ED physician, including pertinent vitals, physical exam findings, labs, and imaging.  We also discussed care given by the ED provider.  1. sepsis with septic shock a. Continue fluid resuscitation b. Blood cultures obtained 2. Community-acquired pneumonia/COPD Antibiotics: Rocephin and azithromycin Robitussin Blood cultures drawn in the emergency department Sputum cultures CBC tomorrow Strep antigen by urine Influenza screen, Covid test negative. Continue LABA/ICS Schedule duo nebs with as needed's. Solu-Medrol 3. Rheumatoid arthritis with Felty syndrome a. Patient states that he is on chronic steroids 4. Chronic diastolic heart failure a. Last echocardiogram was 06/2020 with EF of 60 to 65% and grade 1-diastolic heart dysfunction 5. Protein calorie malnutrition  DVT prophylaxis: Lovenox Consultants: None Code Status: Full code Family Communication:  None Disposition Plan: Pending   Truett Mainland, DO

## 2020-06-29 NOTE — ED Notes (Signed)
EDP made aware of hypotension. Fluid resuscitation began.

## 2020-06-29 NOTE — Progress Notes (Signed)
Elink following Code Sepsis. 

## 2020-06-29 NOTE — ED Notes (Signed)
Pt bed linens changed and pt cleaned up after incontinence of stool.

## 2020-06-29 NOTE — ED Notes (Signed)
Date and time results received: 06/29/20 1603    Test: Neutro Abs Critical Value: 0.2  Name of Provider Notified: Dr Sabra Heck   Orders Received? Or Actions Taken? See orders

## 2020-06-29 NOTE — ED Triage Notes (Signed)
Pt from home with mother. Pt has had a fever and c/o of sob x 2 days.  Wears oxygen 4 L [place] chronically  5 albuterol and 125 mg of solumedrol given by EMS.

## 2020-06-30 DIAGNOSIS — J189 Pneumonia, unspecified organism: Secondary | ICD-10-CM

## 2020-06-30 LAB — CBC
HCT: 38.9 % — ABNORMAL LOW (ref 39.0–52.0)
Hemoglobin: 11.9 g/dL — ABNORMAL LOW (ref 13.0–17.0)
MCH: 27.4 pg (ref 26.0–34.0)
MCHC: 30.6 g/dL (ref 30.0–36.0)
MCV: 89.6 fL (ref 80.0–100.0)
Platelets: 150 10*3/uL (ref 150–400)
RBC: 4.34 MIL/uL (ref 4.22–5.81)
RDW: 19.5 % — ABNORMAL HIGH (ref 11.5–15.5)
WBC: 3.2 10*3/uL — ABNORMAL LOW (ref 4.0–10.5)
nRBC: 1.3 % — ABNORMAL HIGH (ref 0.0–0.2)

## 2020-06-30 LAB — LACTIC ACID, PLASMA
Lactic Acid, Venous: 2.7 mmol/L (ref 0.5–1.9)
Lactic Acid, Venous: 4.3 mmol/L (ref 0.5–1.9)
Lactic Acid, Venous: 4.6 mmol/L (ref 0.5–1.9)
Lactic Acid, Venous: 3.9 mmol/L (ref 0.5–1.9)
Lactic Acid, Venous: 3.9 mmol/L (ref 0.5–1.9)

## 2020-06-30 LAB — PROCALCITONIN: Procalcitonin: 0.19 ng/mL

## 2020-06-30 LAB — BASIC METABOLIC PANEL
Anion gap: 8 (ref 5–15)
BUN: 15 mg/dL (ref 6–20)
CO2: 24 mmol/L (ref 22–32)
Calcium: 7.3 mg/dL — ABNORMAL LOW (ref 8.9–10.3)
Chloride: 107 mmol/L (ref 98–111)
Creatinine, Ser: 0.43 mg/dL — ABNORMAL LOW (ref 0.61–1.24)
GFR, Estimated: >60 mL/min (ref >60)
Glucose, Bld: 168 mg/dL — ABNORMAL HIGH (ref 70–99)
Potassium: 3.2 mmol/L — ABNORMAL LOW (ref 3.5–5.1)
Sodium: 139 mmol/L (ref 135–145)

## 2020-06-30 LAB — MRSA PCR SCREENING: MRSA by PCR: NEGATIVE

## 2020-06-30 MED ORDER — KETOROLAC TROMETHAMINE 15 MG/ML IJ SOLN
15.0000 mg | Freq: Once | INTRAMUSCULAR | Status: AC
  Administered 2020-06-30: 15 mg via INTRAVENOUS
  Filled 2020-06-30: qty 1

## 2020-06-30 MED ORDER — SODIUM CHLORIDE 0.9 % IV SOLN
INTRAVENOUS | Status: DC
  Administered 2020-06-30: 1000 mL via INTRAVENOUS

## 2020-06-30 MED ORDER — NICOTINE 21 MG/24HR TD PT24
21.0000 mg | MEDICATED_PATCH | Freq: Every day | TRANSDERMAL | Status: DC
  Administered 2020-06-30: 21 mg via TRANSDERMAL
  Filled 2020-06-30: qty 1

## 2020-06-30 MED ORDER — NICOTINE 21 MG/24HR TD PT24: 21.0000 mg | MEDICATED_PATCH | Freq: Every day | TRANSDERMAL | Status: DC

## 2020-06-30 MED ORDER — CHLORHEXIDINE GLUCONATE CLOTH 2 % EX PADS
6.0000 | MEDICATED_PAD | Freq: Every day | CUTANEOUS | Status: DC
  Administered 2020-06-30 – 2020-07-01 (×2): 6 via TOPICAL

## 2020-06-30 MED ORDER — PANTOPRAZOLE SODIUM 40 MG PO TBEC
40.0000 mg | DELAYED_RELEASE_TABLET | Freq: Every day | ORAL | Status: DC
  Administered 2020-07-01: 40 mg via ORAL
  Filled 2020-06-30: qty 1

## 2020-06-30 MED ORDER — IPRATROPIUM-ALBUTEROL 0.5-2.5 (3) MG/3ML IN SOLN
3.0000 mL | Freq: Four times a day (QID) | RESPIRATORY_TRACT | Status: DC
  Administered 2020-06-30: 3 mL via RESPIRATORY_TRACT
  Filled 2020-06-30: qty 3

## 2020-06-30 MED ORDER — METHYLPREDNISOLONE SODIUM SUCC 40 MG IJ SOLR
40.0000 mg | Freq: Two times a day (BID) | INTRAMUSCULAR | Status: DC
  Administered 2020-06-30: 40 mg via INTRAVENOUS
  Filled 2020-06-30: qty 1

## 2020-06-30 MED ORDER — ALUM & MAG HYDROXIDE-SIMETH 200-200-20 MG/5ML PO SUSP
30.0000 mL | Freq: Once | ORAL | Status: AC
  Administered 2020-06-30: 30 mL via ORAL
  Filled 2020-06-30: qty 30

## 2020-06-30 NOTE — Progress Notes (Signed)
PROGRESS NOTE    Marcus [name]  TIR:[id number] DOB: 03/03/1961 DOA: 06/29/2020 PCP: Patient, No Pcp Per   Brief Narrative:   Marcus [name] is a 60 y.o. 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  Was brought to the hospital via EMS due to fevers, increasing work of breathing, frequent cough and weakness for the past 2 days.  Patient was admitted with concerns for sepsis secondary to community-acquired pneumonia and has some noted lactic acidosis.  He was also thought to have COPD exacerbation.  Assessment & Plan:   Principal Problem:   CAP (community acquired pneumonia) Active Problems:   Protein-calorie malnutrition, severe   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HCC)   Seropositive rheumatoid arthritis ([place])   Chronic obstructive pulmonary disease (HCC)   Severe sepsis with septic shock (HCC)   Chronic diastolic CHF (congestive heart failure) (HCC)   Septic shock (HCC)   Acute COPD exacerbation-improved -Likely related to viral bronchitis -Plan to continue azithromycin -Continue IV Solu-Medrol now at 40 mg twice daily dosing -Breathing treatments only as needed -Patient does wear 2 L home nasal cannula chronically  SIRS criteria with lactic acidosis, sepsis ruled out -Likely related to above -No sign of infection noted on chest x-ray or UA -Procalcitonin is low -WBC count naturally remains low on account of Felty syndrome -Lactic acidosis likely related to breathing treatments which are no longer scheduled -Continue to monitor lactic acid levels and maintain on IV fluid  Rheumatoid arthritis with Felty syndrome -Patient uses prednisone 10 mg daily at home  Chronic diastolic heart failure -Last 2D echocardiogram 3/22 with EF 67-61% grade 1 diastolic dysfunction  Protein calorie malnutrition  DVT prophylaxis: Lovenox Code Status: Full Family Communication: None at bedside Disposition  Plan:  Status is: Inpatient  Remains inpatient appropriate because:IV treatments appropriate due to intensity of illness or inability to take PO and Inpatient level of care appropriate due to severity of illness   Dispo: The patient is from: Home              Anticipated d/c is to: Home              Patient currently is not medically stable to d/c.   Difficult to place patient No  Consultants:   None  Procedures:   None  Antimicrobials:  Anti-infectives (From admission, onward)   Start     Dose/Rate Route Frequency Ordered Stop   06/29/20 1530  cefTRIAXone (ROCEPHIN) 2 g in sodium chloride 0.9 % 100 mL IVPB  Status:  Discontinued        2 g 200 mL/hr over 30 Minutes Intravenous Every 24 hours 06/29/20 1519 06/30/20 0729   06/29/20 1530  azithromycin (ZITHROMAX) 500 mg in sodium chloride 0.9 % 250 mL IVPB        500 mg 250 mL/hr over 60 Minutes Intravenous Every 24 hours 06/29/20 1519         Subjective: Patient seen and evaluated today with no new acute complaints or concerns. No acute concerns or events noted overnight.  He denies any shortness of breath, chest tightness, wheezing, or cough.  His oxygen has been weaned to his baseline 2 L nasal cannula.  Objective: Vitals:   06/30/20 0200 06/30/20 0253 06/30/20 0509 06/30/20 0833  BP: 110/61 106/77 125/74   Pulse: 63 66 (!) 49   Resp: (!) 24 18 16    Temp: 97.6 F (36.4 C) (!)  97.1 F (36.2 C) (!) 97.5 F (36.4 C)   TempSrc: Oral Oral    SpO2: 93% 95% 95% 92%  Weight:      Height:        Intake/Output Summary (Last 24 hours) at 06/30/2020 1403 Last data filed at 06/30/2020 1300 Gross per 24 hour  Intake 5404.51 ml  Output 3630 ml  Net 1774.51 ml   Filed Weights   06/29/20 1457 06/29/20 1536  Weight: 78 kg 78 kg    Examination:  General exam: Appears calm and comfortable  Respiratory system: Clear to auscultation. Respiratory effort normal.  Currently on 2 L nasal cannula oxygen Cardiovascular system:  S1 & S2 heard, RRR.  Gastrointestinal system: Abdomen is soft Central nervous system: Alert and awake Extremities: No edema Skin: No significant lesions noted Psychiatry: Flat affect.    Data Reviewed: I have personally reviewed following labs and imaging studies  CBC: Recent Labs  Lab 06/29/20 1524 06/30/20 0605  WBC 4.0 3.2*  NEUTROABS 0.2*  --   HGB 14.3 11.9*  HCT 46.0 38.9*  MCV 87.8 89.6  PLT 129* 932   Basic Metabolic Panel: Recent Labs  Lab 06/29/20 1524 06/30/20 0605  NA 132* 139  K 4.2 3.2*  CL 101 107  CO2 22 24  GLUCOSE 100* 168*  BUN 13 15  CREATININE 0.64 0.43*  CALCIUM 7.7* 7.3*   GFR: Estimated Creatinine Clearance: 102.7 mL/min (A) (by C-G formula based on SCr of 0.43 mg/dL (L)). Liver Function Tests: Recent Labs  Lab 06/29/20 1524  AST 39  ALT 13  ALKPHOS 92  BILITOT 1.3*  PROT 7.2  ALBUMIN 2.4*   No results for input(s): LIPASE, AMYLASE in the last 168 hours. No results for input(s): AMMONIA in the last 168 hours. Coagulation Profile: Recent Labs  Lab 06/29/20 1524  INR 1.1   Cardiac Enzymes: No results for input(s): CKTOTAL, CKMB, CKMBINDEX, TROPONINI in the last 168 hours. BNP (last 3 results) No results for input(s): PROBNP in the last 8760 hours. HbA1C: No results for input(s): HGBA1C in the last 72 hours. CBG: No results for input(s): GLUCAP in the last 168 hours. Lipid Profile: No results for input(s): CHOL, HDL, LDLCALC, TRIG, CHOLHDL, LDLDIRECT in the last 72 hours. Thyroid Function Tests: No results for input(s): TSH, T4TOTAL, FREET4, T3FREE, THYROIDAB in the last 72 hours. Anemia Panel: No results for input(s): VITAMINB12, FOLATE, FERRITIN, TIBC, IRON, RETICCTPCT in the last 72 hours. Sepsis Labs: Recent Labs  Lab 06/29/20 1524 06/29/20 1525 06/29/20 1716 06/30/20 0605 06/30/20 0839 06/30/20 1152  PROCALCITON 0.11  --   --  0.19  --   --   LATICACIDVEN  --  2.1* 2.1*  --  2.7* 4.3*    Recent Results  (from the past 240 hour(s))  SARS CORONAVIRUS 2 (TAT 6-24 HRS) Nasopharyngeal Nasopharyngeal Swab     Status: None   Collection Time: 06/20/20  5:52 PM   Specimen: Nasopharyngeal Swab  Result Value Ref Range Status   SARS Coronavirus 2 NEGATIVE NEGATIVE Final    Comment: (NOTE) SARS-CoV-2 target nucleic acids are NOT DETECTED.  The SARS-CoV-2 RNA is generally detectable in upper and lower respiratory specimens during the acute phase of infection. Negative results do not preclude SARS-CoV-2 infection, do not rule out co-infections with other pathogens, and should not be used as the sole basis for treatment or other patient management decisions. Negative results must be combined with clinical observations, patient history, and epidemiological information. The expected result is  Negative.  Fact Sheet for Patients: SugarRoll.be  Fact Sheet for Healthcare Providers: https://www.woods-mathews.com/  This test is not yet approved or cleared by the [place] FDA and  has been authorized for detection and/or diagnosis of SARS-CoV-2 by FDA under an Emergency Use Authorization (EUA). This EUA will remain  in effect (meaning this test can be used) for the duration of the COVID-19 declaration under Se ction 564(b)(1) of the Act, 21 U.S.C. section 360bbb-3(b)(1), unless the authorization is terminated or revoked sooner.  Performed at [facility], [place] [address], [place], [place] [address]   Blood Culture (routine x 2)     Status: None (Preliminary result)   Collection Time: 06/29/20  3:24 PM   Specimen: BLOOD LEFT WRIST  Result Value Ref Range Status   Specimen Description BLOOD LEFT WRIST  Final   Special Requests   Final    Blood Culture adequate volume BOTTLES DRAWN AEROBIC AND ANAEROBIC   Culture   Final    NO GROWTH < 24 HOURS Performed at [facility], [address], [place], [place] [address]    Report Status PENDING  Incomplete   Blood Culture (routine x 2)     Status: None (Preliminary result)   Collection Time: 06/29/20  3:30 PM   Specimen: BLOOD LEFT ARM  Result Value Ref Range Status   Specimen Description BLOOD LEFT ARM  Final   Special Requests   Final    Blood Culture adequate volume BOTTLES DRAWN AEROBIC AND ANAEROBIC   Culture   Final    NO GROWTH < 24 HOURS Performed at [facility] [place] [facility], [address] [place], [place] [address]    Report Status PENDING  Incomplete  Resp Panel by RT-PCR (Flu A&B, Covid) Nasopharyngeal Swab     Status: None   Collection Time: 06/29/20  3:40 PM   Specimen: Nasopharyngeal Swab; Nasopharyngeal(NP) swabs in vial transport medium  Result Value Ref Range Status   SARS Coronavirus 2 by RT PCR NEGATIVE NEGATIVE Final    Comment: (NOTE) SARS-CoV-2 target nucleic acids are NOT DETECTED.  The SARS-CoV-2 RNA is generally detectable in upper respiratory specimens during the acute phase of infection. The lowest concentration of SARS-CoV-2 viral copies this assay can detect is 138 copies/mL. A negative result does not preclude SARS-Cov-2 infection and should not be used as the sole basis for treatment or other patient management decisions. A negative result may occur with  improper specimen collection/handling, submission of specimen other than nasopharyngeal swab, presence of viral mutation(s) within the areas targeted by this assay, and inadequate number of viral copies(<138 copies/mL). A negative result must be combined with clinical observations, patient history, and epidemiological information. The expected result is Negative.  Fact Sheet for Patients:  EntrepreneurPulse.com.au  Fact Sheet for Healthcare Providers:  IncredibleEmployment.be  This test is no t yet approved or cleared by the [place] FDA and  has been authorized for detection and/or diagnosis of SARS-CoV-2 by FDA under an Emergency Use Authorization (EUA). This EUA  will remain  in effect (meaning this test can be used) for the duration of the COVID-19 declaration under Section 564(b)(1) of the Act, 21 U.S.C.section 360bbb-3(b)(1), unless the authorization is terminated  or revoked sooner.       Influenza A by PCR NEGATIVE NEGATIVE Final   Influenza B by PCR NEGATIVE NEGATIVE Final    Comment: (NOTE) The Xpert Xpress SARS-CoV-2/FLU/RSV plus assay is intended as an aid in the diagnosis of influenza from Nasopharyngeal swab specimens  and should not be used as a sole basis for treatment. Nasal washings and aspirates are unacceptable for Xpert Xpress SARS-CoV-2/FLU/RSV testing.  Fact Sheet for Patients: EntrepreneurPulse.com.au  Fact Sheet for Healthcare Providers: IncredibleEmployment.be  This test is not yet approved or cleared by the [place] FDA and has been authorized for detection and/or diagnosis of SARS-CoV-2 by FDA under an Emergency Use Authorization (EUA). This EUA will remain in effect (meaning this test can be used) for the duration of the COVID-19 declaration under Section 564(b)(1) of the Act, 21 U.S.C. section 360bbb-3(b)(1), unless the authorization is terminated or revoked.  Performed at [facility], [address], [place], [place] [address]   MRSA PCR Screening     Status: None   Collection Time: 06/29/20  9:33 PM   Specimen: Nasal Mucosa; Nasopharyngeal  Result Value Ref Range Status   MRSA by PCR NEGATIVE NEGATIVE Final    Comment:        The GeneXpert MRSA Assay (FDA approved for NASAL specimens only), is one component of a comprehensive MRSA colonization surveillance program. It is not intended to diagnose MRSA infection nor to guide or monitor treatment for MRSA infections. Performed at [facility], [address] [place], [place] [address]          Radiology Studies: [facility] Chest [facility] 1 View  Result Date: 06/29/2020 CLINICAL DATA:  Fever and shortness of breath  EXAM: PORTABLE CHEST 1 VIEW COMPARISON:  June 20, 2020 FINDINGS: Diffuse interstitial thickening persists. No edema or airspace opacity. The heart size and pulmonary vascularity are normal. No adenopathy. No bone lesions. IMPRESSION: Diffuse interstitial thickening, likely due to chronic bronchitis. There may be a degree of underlying fibrosis. No edema or airspace opacity. Heart size normal. Electronically Signed   By: Lowella Grip III M.D.   On: 06/29/2020 15:48        Scheduled Meds: . Chlorhexidine Gluconate Cloth  6 each Topical Daily  . enoxaparin (LOVENOX) injection  40 mg Subcutaneous Q24H  . methylPREDNISolone (SOLU-MEDROL) injection  40 mg Intravenous Q12H  . mometasone-formoterol  2 puff Inhalation BID  . pantoprazole  40 mg Oral Daily   Continuous Infusions: . sodium chloride 100 mL/hr at 06/30/20 1008  . azithromycin Stopped (06/29/20 1640)     LOS: 1 day    Time spent: 35 minutes  Milagros Middendorf Darleen Crocker, DO [facility]  If 7PM-7AM, please contact night-coverage [email or web address] 06/30/2020, 2:03 PM

## 2020-06-30 NOTE — Progress Notes (Signed)
Report given. Patient transferred by NT and RN to Room 309 by wheelchair. Awake oriented and conversational.No obvious or stated distress.

## 2020-06-30 NOTE — Progress Notes (Signed)
Patient received from ICU. IV in right had was pulled out.Pressure dressing applied. Patient oriented to room

## 2020-07-01 LAB — BASIC METABOLIC PANEL
Anion gap: 7 (ref 5–15)
BUN: 9 mg/dL (ref 6–20)
CO2: 26 mmol/L (ref 22–32)
Calcium: 7.5 mg/dL — ABNORMAL LOW (ref 8.9–10.3)
Chloride: 108 mmol/L (ref 98–111)
Creatinine, Ser: 0.34 mg/dL — ABNORMAL LOW (ref 0.61–1.24)
GFR, Estimated: >60 mL/min (ref >60)
Glucose, Bld: 140 mg/dL — ABNORMAL HIGH (ref 70–99)
Potassium: 3.1 mmol/L — ABNORMAL LOW (ref 3.5–5.1)
Sodium: 141 mmol/L (ref 135–145)

## 2020-07-01 LAB — CBC
HCT: 36.2 % — ABNORMAL LOW (ref 39.0–52.0)
Hemoglobin: 11.4 g/dL — ABNORMAL LOW (ref 13.0–17.0)
MCH: 27.8 pg (ref 26.0–34.0)
MCHC: 31.5 g/dL (ref 30.0–36.0)
MCV: 88.3 fL (ref 80.0–100.0)
Platelets: 147 10*3/uL — ABNORMAL LOW (ref 150–400)
RBC: 4.1 MIL/uL — ABNORMAL LOW (ref 4.22–5.81)
RDW: 19.8 % — ABNORMAL HIGH (ref 11.5–15.5)
WBC: 2.9 10*3/uL — ABNORMAL LOW (ref 4.0–10.5)
nRBC: 1 % — ABNORMAL HIGH (ref 0.0–0.2)

## 2020-07-01 LAB — PROCALCITONIN: Procalcitonin: 0.16 ng/mL

## 2020-07-01 LAB — MAGNESIUM: Magnesium: 2.1 mg/dL (ref 1.7–2.4)

## 2020-07-01 LAB — URINE CULTURE: Culture: NO GROWTH

## 2020-07-01 LAB — LACTIC ACID, PLASMA: Lactic Acid, Venous: 2.3 mmol/L (ref 0.5–1.9)

## 2020-07-01 MED ORDER — GUAIFENESIN-DM 100-10 MG/5ML PO SYRP: 5.0000 mL | ORAL_SOLUTION | ORAL | 0 refills | Status: DC | PRN

## 2020-07-01 MED ORDER — PANTOPRAZOLE SODIUM 40 MG PO TBEC: 40.0000 mg | DELAYED_RELEASE_TABLET | Freq: Every day | ORAL | 0 refills | Status: DC

## 2020-07-01 MED ORDER — AZITHROMYCIN 500 MG PO TABS: 500.0000 mg | ORAL_TABLET | Freq: Every day | ORAL | 0 refills | Status: AC

## 2020-07-01 MED ORDER — PREDNISONE 10 MG PO TABS: 40.0000 mg | ORAL_TABLET | Freq: Every day | ORAL | 0 refills | Status: AC

## 2020-07-01 MED ORDER — POTASSIUM CHLORIDE CRYS ER 20 MEQ PO TBCR
40.0000 meq | EXTENDED_RELEASE_TABLET | Freq: Once | ORAL | Status: AC
  Administered 2020-07-01: 40 meq via ORAL
  Filled 2020-07-01: qty 2

## 2020-07-01 NOTE — Discharge Summary (Signed)
Physician Discharge Summary  KIAM BRANSFIELD QJJ:[id number] DOB: 1960/12/21 DOA: 06/29/2020  PCP: Patient, No Pcp Per  Admit date: 06/29/2020  Discharge date: 07/01/2020  Admitted From:Home  Disposition:  Home  Recommendations for Outpatient Follow-up:  1. Follow up with PCP in 1-2 weeks 2. Follow up with Pulmonology as scheduled next month in [place] 3. Continue on prednisone 40 mg daily as prescribed and then transitioning to 10 mg prednisone daily as prior 4. Continue on azithromycin for 3 more days 5. Continue breathing treatments as needed for shortness of breath or wheezing  Home Health:None  Equipment/Devices: Uses home 2 L nasal cannula oxygen  Discharge Condition:Stable  CODE STATUS: Full  Diet recommendation: Heart Healthy  Brief/Interim Summary: Quentin Shorey Lynchis a 60 y.o.malewith a history of seropositive rheumatoid arthritis with Felty syndrome, COPD on chronic oxygen therapy at home with continued tobacco abuse. Was brought to the hospital via EMS due to fevers, increasing work of breathing, frequent cough and weakness for the past 2 days.  Patient was admitted with concerns for sepsis secondary to community-acquired pneumonia and has some noted lactic acidosis.  He was also thought to have COPD exacerbation.  Patient did not have any active signs of infection and was thought to have lactic acidosis related to his breathing treatments which were discontinued.  His lactic acidosis has improved and he is now stable for discharge.  His respiratory symptoms have improved considerably since 3/26.  He will continue on prednisone as prescribed for several more days and then transition to his usual home dosing.  We will also continue on azithromycin for 3 more days.  Discharge Diagnoses:  Principal Problem:   CAP (community acquired pneumonia) Active Problems:   Protein-calorie malnutrition, severe   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HCC)   Seropositive rheumatoid arthritis ([place])   Chronic  obstructive pulmonary disease (HCC)   Severe sepsis with septic shock (HCC)   Chronic diastolic CHF (congestive heart failure) ([place])   Septic shock (HCC)  Principal discharge diagnosis: Acute on chronic hypoxemic respiratory failure secondary to COPD exacerbation-resolved.  Lactic acidosis related to breathing treatments-improved.  Discharge Instructions  Discharge Instructions    Diet - low sodium heart healthy   Complete by: As directed    Increase activity slowly   Complete by: As directed      Allergies as of 07/01/2020      Reactions   Levaquin [levofloxacin In D5w] Other (See Comments)   Chest pain   Methotrexate Other (See Comments)   Chest pain      Medication List    TAKE these medications   albuterol (2.5 MG/3ML) 0.083% nebulizer solution Commonly known as: PROVENTIL Take 3 mLs (2.5 mg total) by nebulization every 6 (six) hours as needed for wheezing or shortness of breath.   azithromycin 500 MG tablet Commonly known as: Zithromax Take 1 tablet (500 mg total) by mouth daily for 3 days. Take 1 tablet daily for 3 days.   guaiFENesin-dextromethorphan 100-10 MG/5ML syrup Commonly known as: ROBITUSSIN DM Take 5 mLs by mouth every 4 (four) hours as needed for cough.   mometasone-formoterol 100-5 MCG/ACT Aero Commonly known as: DULERA Inhale 2 puffs into the lungs 2 (two) times daily.   pantoprazole 40 MG tablet Commonly known as: PROTONIX Take 1 tablet (40 mg total) by mouth daily for 10 days. Start taking on: July 02, 2020   predniSONE 10 MG tablet Commonly known as: DELTASONE Take 4 tablets (40 mg total) by mouth daily for 5 days.  Follow-up Information    pcp. Schedule an appointment as soon as possible for a visit in 1 week(s).              Allergies  Allergen Reactions  . Levaquin [Levofloxacin In D5w] Other (See Comments)    Chest pain  . Methotrexate Other (See Comments)    Chest pain     Consultations:  None   Procedures/Studies: DG Chest 2 View  Result Date: 06/10/2020 CLINICAL DATA:  Chest pain. EXAM: CHEST - 2 VIEW COMPARISON:  January 22, 2020 FINDINGS: There is no evidence of an acute infiltrate. A very small right pleural effusion is seen. No pneumothorax is identified. The heart size and mediastinal contours are within normal limits. Chronic sixth and seventh right rib fractures are seen. IMPRESSION: Very small right pleural effusion without evidence of acute infiltrate. Electronically Signed   By: Virgina Norfolk M.D.   On: 06/10/2020 20:25   CT Angio Chest PE W and/or Wo Contrast  Result Date: 06/10/2020 CLINICAL DATA:  Chest and abdominal pain EXAM: CT ANGIOGRAPHY CHEST CT ABDOMEN AND PELVIS WITH CONTRAST TECHNIQUE: Multidetector CT imaging of the chest was performed using the standard protocol during bolus administration of intravenous contrast. Multiplanar CT image reconstructions and MIPs were obtained to evaluate the vascular anatomy. Multidetector CT imaging of the abdomen and pelvis was performed using the standard protocol during bolus administration of intravenous contrast. CONTRAST:  169mL OMNIPAQUE IOHEXOL 350 MG/ML SOLN COMPARISON:  01/17/2020 FINDINGS: CTA CHEST FINDINGS Cardiovascular: Thoracic aorta demonstrates atherosclerotic calcification without aneurysmal dilatation or dissection. Pulmonary artery shows a normal branching pattern bilaterally. No filling defects to suggest pulmonary emboli are noted. Coronary calcifications are seen. No significant cardiac enlargement is noted. Mediastinum/Nodes: Thoracic inlet is within normal limits. No hilar or mediastinal adenopathy is seen. The esophagus as visualized is within normal limits. Lungs/Pleura: Diffuse emphysematous changes are identified throughout both lungs. No focal infiltrate or sizable effusion is seen. Previously noted inflammatory changes have resolved in the interval. No sizable parenchymal  nodules are seen. Musculoskeletal: Mild degenerative changes of the thoracic spine are seen. 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 No acute rib abnormality is seen. Review of the MIP images confirms the above findings. CT ABDOMEN and PELVIS FINDINGS Hepatobiliary: Liver is within normal limits. Gallbladder is decompressed. Partially calcified gallstones are seen within the gallbladder without complicating factors. Pancreas: Unremarkable. No pancreatic ductal dilatation or surrounding inflammatory changes. Spleen: Spleen has been surgically removed. Adrenals/Urinary Tract: Adrenal glands are within normal limits. Kidneys show a normal enhancement pattern bilaterally. No renal calculi or obstructive changes are seen. The bladder is partially distended. Stomach/Bowel: The appendix is within normal limits. No obstructive or inflammatory changes of the colon are seen. Small bowel shows no obstructive change. Herniated loop of small bowel in the right inguinal hernia is seen without evidence of incarceration or obstructive change. Stomach is within normal limits. Vascular/Lymphatic: Aortic atherosclerosis. No enlarged abdominal or pelvic lymph nodes. Reproductive: Prostate is unremarkable. Other: Right inguinal hernia containing a loop of small bowel without incarceration. Previously seen ventral hernia has been repaired. No recurrent herniation anteriorly is noted. No free fluid is seen. Musculoskeletal: No acute or significant osseous findings. Review of the MIP images confirms the above findings. IMPRESSION: CTA of the chest: No evidence of pulmonary emboli. Emphysematous changes without acute infiltrate. CT of the abdomen and pelvis: Repair of previously seen ventral hernia. Right inguinal hernia containing a loop of small bowel is seen without  evidence of incarceration. Cholelithiasis without complicating factors. Changes of prior splenectomy. Aortic Atherosclerosis (ICD10-I70.0) and Emphysema  (ICD10-J43.9). Electronically Signed   By: Inez Catalina M.D.   On: 06/10/2020 23:39   CT ABDOMEN PELVIS W CONTRAST  Result Date: 06/20/2020 CLINICAL DATA:  60 year old male with history of generalized abdominal pain since 06/10/2020. EXAM: CT ABDOMEN AND PELVIS WITH CONTRAST TECHNIQUE: Multidetector CT imaging of the abdomen and pelvis was performed using the standard protocol following bolus administration of intravenous contrast. CONTRAST:  160mL OMNIPAQUE IOHEXOL 300 MG/ML  SOLN COMPARISON:  CT the abdomen and pelvis 06/10/2020. FINDINGS: Lower chest: Aortic atherosclerosis. Hepatobiliary: No suspicious cystic or solid hepatic lesions. No intra or extrahepatic biliary ductal dilatation. Small calcified and noncalcified gallstones lying dependently in the gallbladder. Gallbladder is not distended. Gallbladder wall thickness is normal. No pericholecystic fluid or surrounding inflammatory changes. Pancreas: No pancreatic mass. No pancreatic ductal dilatation. No pancreatic or peripancreatic fluid collections or inflammatory changes. Spleen: Status post splenectomy. Adrenals/Urinary Tract: Bilateral kidneys and adrenal glands are normal in appearance. No hydroureteronephrosis. Urinary bladder is normal in appearance. Stomach/Bowel: Normal appearance of the stomach. No pathologic dilatation of small bowel or colon. A few scattered colonic diverticulae are noted, without surrounding inflammatory changes to suggest an acute diverticulitis at this time. Normal appendix. Vascular/Lymphatic: Aortic atherosclerosis with mild fusiform aneurysmal dilatation of the infrarenal abdominal aorta which measures up to 3.2 x 2.8 cm in diameter. No lymphadenopathy noted in the abdomen or pelvis. Reproductive: Prostate gland and seminal vesicles are unremarkable in appearance. Other: No significant volume of ascites.  No pneumoperitoneum. Musculoskeletal: There are no aggressive appearing lytic or blastic lesions noted in the  visualized portions of the skeleton. IMPRESSION: 1. No acute findings are noted in the abdomen or pelvis to account for the patient's symptoms. 2. Mild colonic diverticulosis without evidence of acute diverticulitis at this time. 3. Aortic atherosclerosis with mild fusiform aneurysmal dilatation of the infrarenal abdominal aorta (3.2 x 2.8 cm). Recommend follow-up ultrasound every 3 years. This recommendation follows ACR consensus guidelines: White Paper of the ACR Incidental Findings Committee II on Vascular Findings. J Am Coll Radiol 2013; 10:789-794. 4. Cholelithiasis without evidence of acute cholecystitis at this time. Electronically Signed   By: Vinnie Langton M.D.   On: 06/20/2020 17:08   CT ABDOMEN PELVIS W CONTRAST  Result Date: 06/10/2020 CLINICAL DATA:  Chest and abdominal pain EXAM: CT ANGIOGRAPHY CHEST CT ABDOMEN AND PELVIS WITH CONTRAST TECHNIQUE: Multidetector CT imaging of the chest was performed using the standard protocol during bolus administration of intravenous contrast. Multiplanar CT image reconstructions and MIPs were obtained to evaluate the vascular anatomy. Multidetector CT imaging of the abdomen and pelvis was performed using the standard protocol during bolus administration of intravenous contrast. CONTRAST:  120mL OMNIPAQUE IOHEXOL 350 MG/ML SOLN COMPARISON:  01/17/2020 FINDINGS: CTA CHEST FINDINGS Cardiovascular: Thoracic aorta demonstrates atherosclerotic calcification without aneurysmal dilatation or dissection. Pulmonary artery shows a normal branching pattern bilaterally. No filling defects to suggest pulmonary emboli are noted. Coronary calcifications are seen. No significant cardiac enlargement is noted. Mediastinum/Nodes: Thoracic inlet is within normal limits. No hilar or mediastinal adenopathy is seen. The esophagus as visualized is within normal limits. Lungs/Pleura: Diffuse emphysematous changes are identified throughout both lungs. No focal infiltrate or sizable  effusion is seen. Previously noted inflammatory changes have resolved in the interval. No sizable parenchymal nodules are seen. Musculoskeletal: Mild degenerative changes of the thoracic spine are seen. 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  No acute rib abnormality is seen. Review of the MIP images confirms the above findings. CT ABDOMEN and PELVIS FINDINGS Hepatobiliary: Liver is within normal limits. Gallbladder is decompressed. Partially calcified gallstones are seen within the gallbladder without complicating factors. Pancreas: Unremarkable. No pancreatic ductal dilatation or surrounding inflammatory changes. Spleen: Spleen has been surgically removed. Adrenals/Urinary Tract: Adrenal glands are within normal limits. Kidneys show a normal enhancement pattern bilaterally. No renal calculi or obstructive changes are seen. The bladder is partially distended. Stomach/Bowel: The appendix is within normal limits. No obstructive or inflammatory changes of the colon are seen. Small bowel shows no obstructive change. Herniated loop of small bowel in the right inguinal hernia is seen without evidence of incarceration or obstructive change. Stomach is within normal limits. Vascular/Lymphatic: Aortic atherosclerosis. No enlarged abdominal or pelvic lymph nodes. Reproductive: Prostate is unremarkable. Other: Right inguinal hernia containing a loop of small bowel without incarceration. Previously seen ventral hernia has been repaired. No recurrent herniation anteriorly is noted. No free fluid is seen. Musculoskeletal: No acute or significant osseous findings. Review of the MIP images confirms the above findings. IMPRESSION: CTA of the chest: No evidence of pulmonary emboli. Emphysematous changes without acute infiltrate. CT of the abdomen and pelvis: Repair of previously seen ventral hernia. Right inguinal hernia containing a loop of small bowel is seen without evidence of incarceration. Cholelithiasis  without complicating factors. Changes of prior splenectomy. Aortic Atherosclerosis (ICD10-I70.0) and Emphysema (ICD10-J43.9). Electronically Signed   By: Inez Catalina M.D.   On: 06/10/2020 23:39   DG Chest Port 1 View  Result Date: 06/29/2020 CLINICAL DATA:  Fever and shortness of breath EXAM: PORTABLE CHEST 1 VIEW COMPARISON:  June 20, 2020 FINDINGS: Diffuse interstitial thickening persists. No edema or airspace opacity. The heart size and pulmonary vascularity are normal. No adenopathy. No bone lesions. IMPRESSION: Diffuse interstitial thickening, likely due to chronic bronchitis. There may be a degree of underlying fibrosis. No edema or airspace opacity. Heart size normal. Electronically Signed   By: Lowella Grip III M.D.   On: 06/29/2020 15:48   DG Chest Port 1 View  Result Date: 06/20/2020 CLINICAL DATA:  Abdominal pain, rectal bleeding since Sunday, cough, COPD, smoker EXAM: PORTABLE CHEST 1 VIEW COMPARISON:  Portable exam 1507 hours compared to 06/10/2020 FINDINGS: Normal heart size, mediastinal contours, and pulmonary vascularity. Emphysematous and bronchitic changes consistent with COPD. Scattered interstitial prominence in both lungs, slightly increased on LEFT since earlier study, question minimal atypical infection. No pleural effusion or pneumothorax. Bones demineralized with old fractures of the posterolateral RIGHT sixth and seventh ribs again noted. IMPRESSION: COPD changes with slightly increased interstitial markings especially in LEFT lung since previous exam, could represent question atypical infection. Electronically Signed   By: Mark  Boles M.D.   On: 06/20/2020 15:31   DG Abd Portable 1 View  Result Date: 06/10/2020 CLINICAL DATA:  Abdominal pain x5 months. EXAM: PORTABLE ABDOMEN - 1 VIEW COMPARISON:  January 19, 2020 FINDINGS: The bowel gas pattern is normal. No radio-opaque calculi or other significant radiographic abnormality are seen. IMPRESSION: Negative. Electronically  Signed   By: Thaddeus  Houston M.D.   On: 06/10/2020 22:18   ECHOCARDIOGRAM COMPLETE  Result Date: 06/11/2020    ECHOCARDIOGRAM REPORT   Patient Name:   Barnaby C Boyajian Date of Exam: 06/11/2020 Medical Rec #:  [id number]    Height:       70.0 in Accession #:    [id number]   Weight:       17 2.0  lb Date of Birth:  10-Jul-1960     BSA:          1.958 m Patient Age:    60 years     BP:           132/78 mmHg Patient Gender: M            HR:           73 bpm. Exam Location:  Inpatient Procedure: 2D Echo Indications:    Chest Pain R07.9  History:        Patient has prior history of Echocardiogram examinations, most                 recent 01/18/2020. COPD; Risk Factors:Current Smoker.  Sonographer:    Mikki Santee RDCS (AE) Referring Phys: [place]  1. Left ventricular ejection fraction, by estimation, is 60 to 65%. The left ventricle has normal function. The left ventricle has no regional wall motion abnormalities. Left ventricular diastolic parameters are consistent with Grade I diastolic dysfunction (impaired relaxation).  2. Right ventricular systolic function is mildly reduced. The right ventricular size is moderately enlarged. Tricuspid regurgitation signal is inadequate for assessing PA pressure.  3. The mitral valve is normal in structure. Trivial mitral valve regurgitation.  4. The aortic valve was not well visualized. Aortic valve regurgitation is not visualized. No aortic stenosis is present.  5. The inferior vena cava is dilated in size with <50% respiratory variability, suggesting right atrial pressure of 15 mmHg. Comparison(s): Compared to prior exam on 01/18/20, the RV now appears moderately dilated (was previously severely dilated) with improved systolic function. RAP is ~50mmHg. FINDINGS  Left Ventricle: Left ventricular ejection fraction, by estimation, is 60 to 65%. The left ventricle has normal function. The left ventricle has no regional wall motion abnormalities. The left  ventricular internal cavity size was normal in size. There is  no left ventricular hypertrophy. Left ventricular diastolic parameters are consistent with Grade I diastolic dysfunction (impaired relaxation). Right Ventricle: The right ventricular size is moderately enlarged. No increase in right ventricular wall thickness. Right ventricular systolic function is mildly reduced. Tricuspid regurgitation signal is inadequate for assessing PA pressure. Left Atrium: Left atrial size was normal in size. Right Atrium: Right atrial size was normal in size. Pericardium: There is no evidence of pericardial effusion. Mitral Valve: The mitral valve is normal in structure. Trivial mitral valve regurgitation. Tricuspid Valve: The tricuspid valve is normal in structure. Tricuspid valve regurgitation is not demonstrated. Aortic Valve: The aortic valve was not well visualized. Aortic valve regurgitation is not visualized. No aortic stenosis is present. Pulmonic Valve: The pulmonic valve was not well visualized. Pulmonic valve regurgitation is trivial. Aorta: The aortic root is normal in size and structure. Venous: The inferior vena cava is dilated in size with less than 50% respiratory variability, suggesting right atrial pressure of 15 mmHg. IAS/Shunts: No atrial level shunt detected by color flow Doppler.  LEFT VENTRICLE PLAX 2D LVIDd:         4.90 cm  Diastology LVIDs:         3.50 cm  LV e' medial:    8.92 cm/s LV PW:         1.00 cm  LV E/e' medial:  7.4 LV IVS:        0.85 cm  LV e' lateral:   11.20 cm/s LVOT diam:     2.20 cm  LV E/e' lateral: 5.9 LV SV:  81 LV SV Index:   41 LVOT Area:     3.80 cm  RIGHT VENTRICLE RV S prime:     9.68 cm/s TAPSE (M-mode): 1.6 cm LEFT ATRIUM             Index       RIGHT ATRIUM           Index LA diam:        3.50 cm 1.79 cm/m  RA Area:     17.40 cm LA Vol (A2C):   42.2 ml 21.56 ml/m RA Volume:   50.60 ml  25.85 ml/m LA Vol (A4C):   39.4 ml 20.13 ml/m LA Biplane Vol: 41.2 ml 21.04  ml/m  AORTIC VALVE LVOT Vmax:   101.00 cm/s LVOT Vmean:  67.200 cm/s LVOT VTI:    0.212 m  AORTA Ao Root diam: 3.30 cm MITRAL VALVE MV Area (PHT): 2.11 cm    SHUNTS MV Decel Time: 359 msec    Systemic VTI:  0.21 m MV E velocity: 65.60 cm/s  Systemic Diam: 2.20 cm MV A velocity: 84.00 cm/s MV E/A ratio:  0.78 Gwyndolyn Kaufman MD Electronically signed by Gwyndolyn Kaufman MD Signature Date/Time: 06/11/2020/2:30:43 PM    Final       Discharge Exam: Vitals:   07/01/20 0526 07/01/20 0911  BP: 115/74   Pulse: 66   Resp: 18   Temp: 97.6 F (36.4 C)   SpO2: 94% 90%   Vitals:   06/30/20 2108 06/30/20 2123 07/01/20 0526 07/01/20 0911  BP: 120/73  115/74   Pulse: 67  66   Resp: 18  18   Temp: (!) 97.3 F (36.3 C)  97.6 F (36.4 C)   TempSrc: Oral  Oral   SpO2: 94% 92% 94% 90%  Weight:      Height:        General: Pt is alert, awake, not in acute distress Cardiovascular: RRR, S1/S2 +, no rubs, no gallops Respiratory: CTA bilaterally, no wheezing, no rhonchi, on 2 L nasal cannula oxygen Abdominal: Soft, NT, ND, bowel sounds + Extremities: no edema, no cyanosis    The results of significant diagnostics from this hospitalization (including imaging, microbiology, ancillary and laboratory) are listed below for reference.     Microbiology: Recent Results (from the past 240 hour(s))  Blood Culture (routine x 2)     Status: None (Preliminary result)   Collection Time: 06/29/20  3:24 PM   Specimen: BLOOD LEFT WRIST  Result Value Ref Range Status   Specimen Description BLOOD LEFT WRIST  Final   Special Requests   Final    Blood Culture adequate volume BOTTLES DRAWN AEROBIC AND ANAEROBIC   Culture   Final    NO GROWTH 2 DAYS Performed at [facility], [address] [place], [place] [address]    Report Status PENDING  Incomplete  Blood Culture (routine x 2)     Status: None (Preliminary result)   Collection Time: 06/29/20  3:30 PM   Specimen: BLOOD LEFT ARM  Result Value Ref Range  Status   Specimen Description BLOOD LEFT ARM  Final   Special Requests   Final    Blood Culture adequate volume BOTTLES DRAWN AEROBIC AND ANAEROBIC   Culture   Final    NO GROWTH 2 DAYS Performed at [facility], [address] [place], [place] [address]    Report Status PENDING  Incomplete  Resp Panel by RT-PCR (Flu A&B, Covid) Nasopharyngeal Swab     Status:  None   Collection Time: 06/29/20  3:40 PM   Specimen: Nasopharyngeal Swab; Nasopharyngeal(NP) swabs in vial transport medium  Result Value Ref Range Status   SARS Coronavirus 2 by RT PCR NEGATIVE NEGATIVE Final    Comment: (NOTE) SARS-CoV-2 target nucleic acids are NOT DETECTED.  The SARS-CoV-2 RNA is generally detectable in upper respiratory specimens during the acute phase of infection. The lowest concentration of SARS-CoV-2 viral copies this assay can detect is 138 copies/mL. A negative result does not preclude SARS-Cov-2 infection and should not be used as the sole basis for treatment or other patient management decisions. A negative result may occur with  improper specimen collection/handling, submission of specimen other than nasopharyngeal swab, presence of viral mutation(s) within the areas targeted by this assay, and inadequate number of viral copies(<138 copies/mL). A negative result must be combined with clinical observations, patient history, and epidemiological information. The expected result is Negative.  Fact Sheet for Patients:  EntrepreneurPulse.com.au  Fact Sheet for Healthcare Providers:  IncredibleEmployment.be  This test is no t yet approved or cleared by the [place] FDA and  has been authorized for detection and/or diagnosis of SARS-CoV-2 by FDA under an Emergency Use Authorization (EUA). This EUA will remain  in effect (meaning this test can be used) for the duration of the COVID-19 declaration under Section 564(b)(1) of the Act, 21 U.S.C.section  360bbb-3(b)(1), unless the authorization is terminated  or revoked sooner.       Influenza A by PCR NEGATIVE NEGATIVE Final   Influenza B by PCR NEGATIVE NEGATIVE Final    Comment: (NOTE) The Xpert Xpress SARS-CoV-2/FLU/RSV plus assay is intended as an aid in the diagnosis of influenza from Nasopharyngeal swab specimens and should not be used as a sole basis for treatment. Nasal washings and aspirates are unacceptable for Xpert Xpress SARS-CoV-2/FLU/RSV testing.  Fact Sheet for Patients: EntrepreneurPulse.com.au  Fact Sheet for Healthcare Providers: IncredibleEmployment.be  This test is not yet approved or cleared by the [place] FDA and has been authorized for detection and/or diagnosis of SARS-CoV-2 by FDA under an Emergency Use Authorization (EUA). This EUA will remain in effect (meaning this test can be used) for the duration of the COVID-19 declaration under Section 564(b)(1) of the Act, 21 U.S.C. section 360bbb-3(b)(1), unless the authorization is terminated or revoked.  Performed at [facility], [address] [place], [place] [address]   MRSA PCR Screening     Status: None   Collection Time: 06/29/20  9:33 PM   Specimen: Nasal Mucosa; Nasopharyngeal  Result Value Ref Range Status   MRSA by PCR NEGATIVE NEGATIVE Final    Comment:        The GeneXpert MRSA Assay (FDA approved for NASAL specimens only), is one component of a comprehensive MRSA colonization surveillance program. It is not intended to diagnose MRSA infection nor to guide or monitor treatment for MRSA infections. Performed at [facility], [address] [place], [place] [address]      Labs: BNP (last 3 results) No results for input(s): BNP in the last 8760 hours. Basic Metabolic Panel: Recent Labs  Lab 06/29/20 1524 06/30/20 0605 07/01/20 0601  NA 132* 139 141  K 4.2 3.2* 3.1*  CL 101 107 108  CO2 22 24 26   GLUCOSE 100* 168* 140*  BUN 13 15 9    CREATININE 0.64 0.43* 0.34*  CALCIUM 7.7* 7.3* 7.5*  MG  --   --  2.1   Liver Function Tests: Recent Labs  Lab 06/29/20 1524  AST 39  ALT 13  ALKPHOS 92  BILITOT 1.3*  PROT 7.2  ALBUMIN 2.4*   No results for input(s): LIPASE, AMYLASE in the last 168 hours. No results for input(s): AMMONIA in the last 168 hours. CBC: Recent Labs  Lab 06/29/20 1524 06/30/20 0605 07/01/20 0601  WBC 4.0 3.2* 2.9*  NEUTROABS 0.2*  --   --   HGB 14.3 11.9* 11.4*  HCT 46.0 38.9* 36.2*  MCV 87.8 89.6 88.3  PLT 129* 150 147*   Cardiac Enzymes: No results for input(s): CKTOTAL, CKMB, CKMBINDEX, TROPONINI in the last 168 hours. BNP: Invalid input(s): POCBNP CBG: No results for input(s): GLUCAP in the last 168 hours. D-Dimer No results for input(s): DDIMER in the last 72 hours. Hgb A1c No results for input(s): HGBA1C in the last 72 hours. Lipid Profile No results for input(s): CHOL, HDL, LDLCALC, TRIG, CHOLHDL, LDLDIRECT in the last 72 hours. Thyroid function studies No results for input(s): TSH, T4TOTAL, T3FREE, THYROIDAB in the last 72 hours.  Invalid input(s): FREET3 Anemia work up No results for input(s): VITAMINB12, FOLATE, FERRITIN, TIBC, IRON, RETICCTPCT in the last 72 hours. Urinalysis    Component Value Date/Time   COLORURINE YELLOW 06/29/2020 1755   APPEARANCEUR HAZY (A) 06/29/2020 1755   LABSPEC 1.024 06/29/2020 1755   PHURINE 5.0 06/29/2020 1755   GLUCOSEU NEGATIVE 06/29/2020 1755   HGBUR NEGATIVE 06/29/2020 1755   BILIRUBINUR NEGATIVE 06/29/2020 1755   KETONESUR 5 (A) 06/29/2020 1755   PROTEINUR 30 (A) 06/29/2020 1755   UROBILINOGEN 0.2 10/21/2012 1737   NITRITE NEGATIVE 06/29/2020 1755   LEUKOCYTESUR NEGATIVE 06/29/2020 1755   Sepsis Labs Invalid input(s): PROCALCITONIN,  WBC,  LACTICIDVEN Microbiology Recent Results (from the past 240 hour(s))  Blood Culture (routine x 2)     Status: None (Preliminary result)   Collection Time: 06/29/20  3:24 PM   Specimen:  BLOOD LEFT WRIST  Result Value Ref Range Status   Specimen Description BLOOD LEFT WRIST  Final   Special Requests   Final    Blood Culture adequate volume BOTTLES DRAWN AEROBIC AND ANAEROBIC   Culture   Final    NO GROWTH 2 DAYS Performed at [facility], [address] [place], [place] [address]    Report Status PENDING  Incomplete  Blood Culture (routine x 2)     Status: None (Preliminary result)   Collection Time: 06/29/20  3:30 PM   Specimen: BLOOD LEFT ARM  Result Value Ref Range Status   Specimen Description BLOOD LEFT ARM  Final   Special Requests   Final    Blood Culture adequate volume BOTTLES DRAWN AEROBIC AND ANAEROBIC   Culture   Final    NO GROWTH 2 DAYS Performed at [facility], [address] [place], [place] [address]    Report Status PENDING  Incomplete  Resp Panel by RT-PCR (Flu A&B, Covid) Nasopharyngeal Swab     Status: None   Collection Time: 06/29/20  3:40 PM   Specimen: Nasopharyngeal Swab; Nasopharyngeal(NP) swabs in vial transport medium  Result Value Ref Range Status   SARS Coronavirus 2 by RT PCR NEGATIVE NEGATIVE Final    Comment: (NOTE) SARS-CoV-2 target nucleic acids are NOT DETECTED.  The SARS-CoV-2 RNA is generally detectable in upper respiratory specimens during the acute phase of infection. The lowest concentration of SARS-CoV-2 viral copies this assay can detect is 138 copies/mL. A negative result does not preclude SARS-Cov-2 infection and should not be used as the sole basis for treatment or  other patient management decisions. A negative result may occur with  improper specimen collection/handling, submission of specimen other than nasopharyngeal swab, presence of viral mutation(s) within the areas targeted by this assay, and inadequate number of viral copies(<138 copies/mL). A negative result must be combined with clinical observations, patient history, and epidemiological information. The expected result is Negative.  Fact Sheet for  Patients:  EntrepreneurPulse.com.au  Fact Sheet for Healthcare Providers:  IncredibleEmployment.be  This test is no t yet approved or cleared by the [place] FDA and  has been authorized for detection and/or diagnosis of SARS-CoV-2 by FDA under an Emergency Use Authorization (EUA). This EUA will remain  in effect (meaning this test can be used) for the duration of the COVID-19 declaration under Section 564(b)(1) of the Act, 21 U.S.C.section 360bbb-3(b)(1), unless the authorization is terminated  or revoked sooner.       Influenza A by PCR NEGATIVE NEGATIVE Final   Influenza B by PCR NEGATIVE NEGATIVE Final    Comment: (NOTE) The Xpert Xpress SARS-CoV-2/FLU/RSV plus assay is intended as an aid in the diagnosis of influenza from Nasopharyngeal swab specimens and should not be used as a sole basis for treatment. Nasal washings and aspirates are unacceptable for Xpert Xpress SARS-CoV-2/FLU/RSV testing.  Fact Sheet for Patients: EntrepreneurPulse.com.au  Fact Sheet for Healthcare Providers: IncredibleEmployment.be  This test is not yet approved or cleared by the [place] FDA and has been authorized for detection and/or diagnosis of SARS-CoV-2 by FDA under an Emergency Use Authorization (EUA). This EUA will remain in effect (meaning this test can be used) for the duration of the COVID-19 declaration under Section 564(b)(1) of the Act, 21 U.S.C. section 360bbb-3(b)(1), unless the authorization is terminated or revoked.  Performed at [facility], [address] [place], [place] [address]   MRSA PCR Screening     Status: None   Collection Time: 06/29/20  9:33 PM   Specimen: Nasal Mucosa; Nasopharyngeal  Result Value Ref Range Status   MRSA by PCR NEGATIVE NEGATIVE Final    Comment:        The GeneXpert MRSA Assay (FDA approved for NASAL specimens only), is one component of a comprehensive MRSA  colonization surveillance program. It is not intended to diagnose MRSA infection nor to guide or monitor treatment for MRSA infections. Performed at [facility], [address] [place], [place] [address]      Time coordinating discharge: 35 minutes  SIGNED:   Rodena Goldmann, DO [facility] 07/01/2020, 9:22 AM  If 7PM-7AM, please contact night-coverage [email or web address]

## 2020-07-01 NOTE — Plan of Care (Signed)

## 2020-07-04 LAB — CULTURE, BLOOD (ROUTINE X 2)
Culture: NO GROWTH
Culture: NO GROWTH
Special Requests: ADEQUATE
Special Requests: ADEQUATE

## 2020-07-10 ENCOUNTER — Other Ambulatory Visit (HOSPITAL_COMMUNITY): Payer: Self-pay | Admitting: Family Medicine

## 2020-07-10 DIAGNOSIS — R609 Edema, unspecified: Secondary | ICD-10-CM

## 2020-07-16 ENCOUNTER — Ambulatory Visit (HOSPITAL_COMMUNITY): Payer: Medicaid Other

## 2020-07-16 ENCOUNTER — Encounter (HOSPITAL_COMMUNITY): Payer: Self-pay

## 2020-07-19 ENCOUNTER — Ambulatory Visit: Payer: Medicaid Other | Admitting: Nurse Practitioner

## 2020-07-24 ENCOUNTER — Other Ambulatory Visit: Payer: Self-pay

## 2020-07-24 ENCOUNTER — Ambulatory Visit (HOSPITAL_COMMUNITY)
Admission: RE | Admit: 2020-07-24 | Discharge: 2020-07-24 | Disposition: A | Discharge status: Home / Self Care | Payer: Medicaid Other | Source: Ambulatory Visit | Attending: Family Medicine | Admitting: Family Medicine

## 2020-07-24 DIAGNOSIS — R609 Edema, unspecified: Secondary | ICD-10-CM | POA: Diagnosis present

## 2020-07-24 IMAGING — US US EXTREM LOW VENOUS*L*
1 series · 13 of 24 positions shown · non-contrast
Comparison: None.

CLINICAL DATA: Left lower extremity edema.



[Series 1: us venous img lower uni left (dvt) · portal-venous · 13 of 35 slices shown]
[im 1/35]
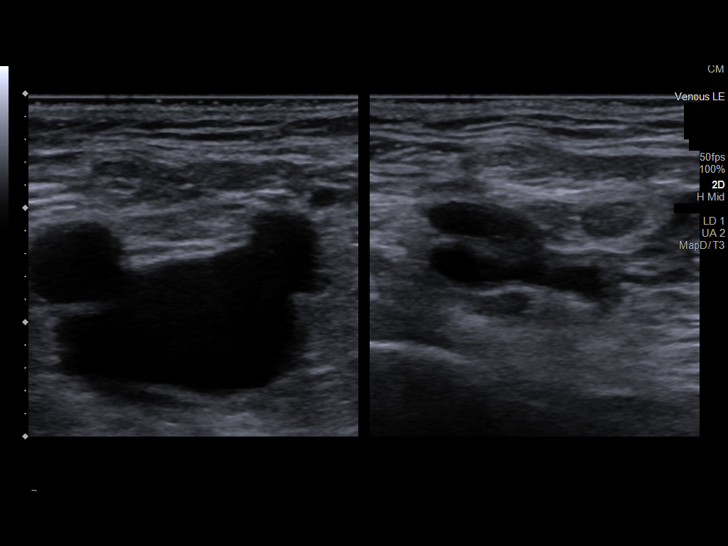
[im 3/35]
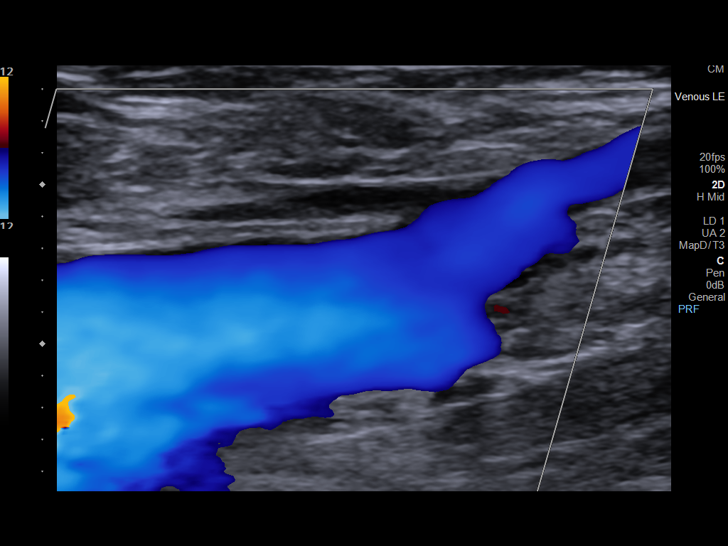
[im 6/35]
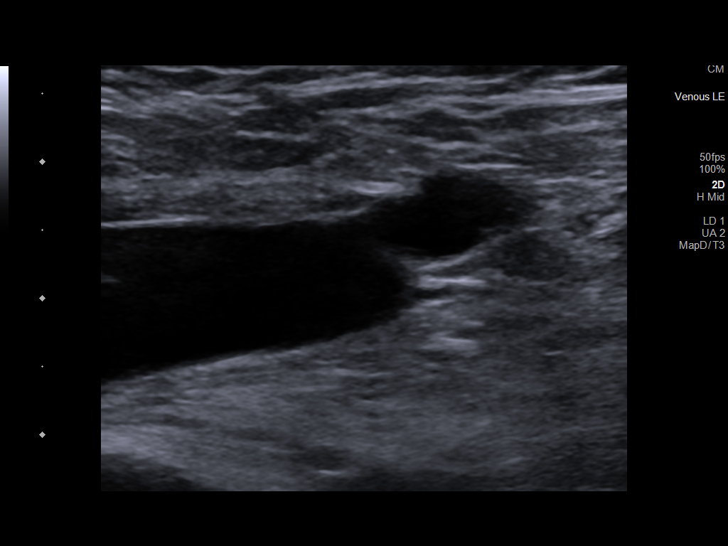
[im 9/35]
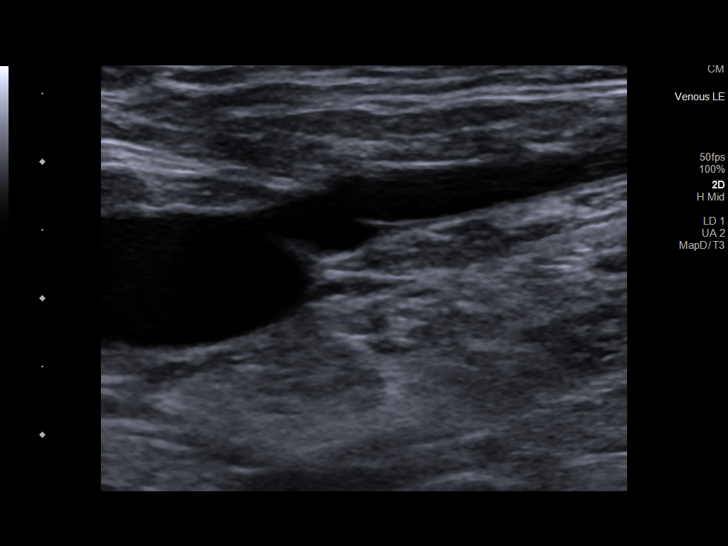
[im 12/35]
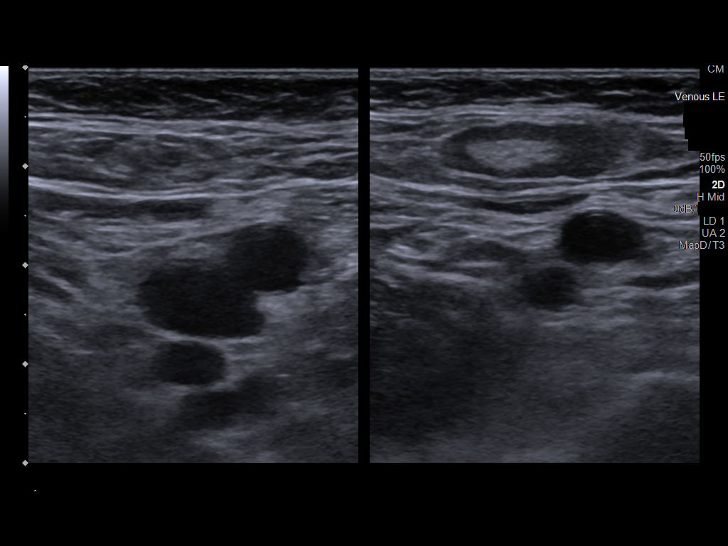
[im 15/35]
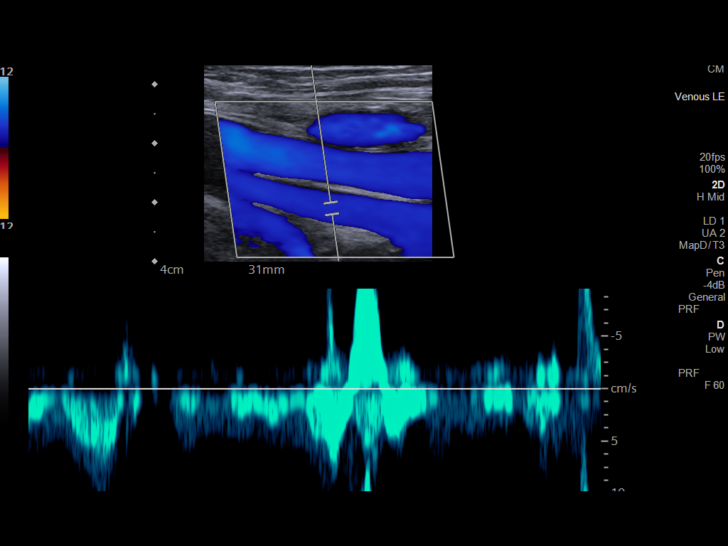
[im 18/35]
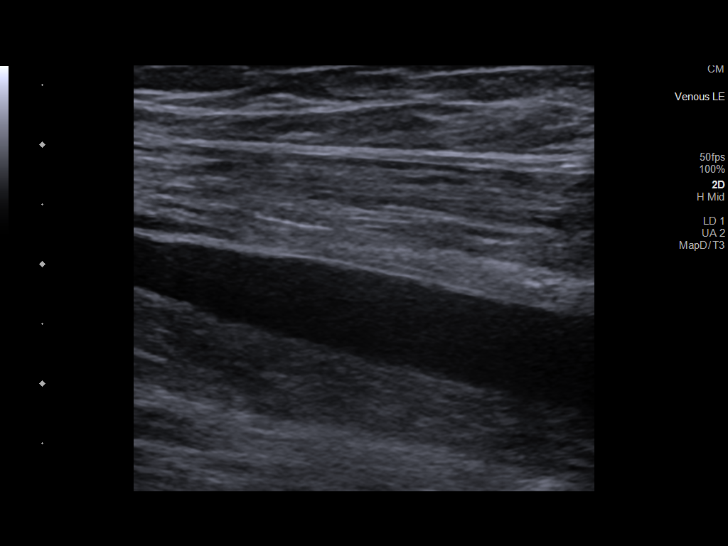
[im 20/35]
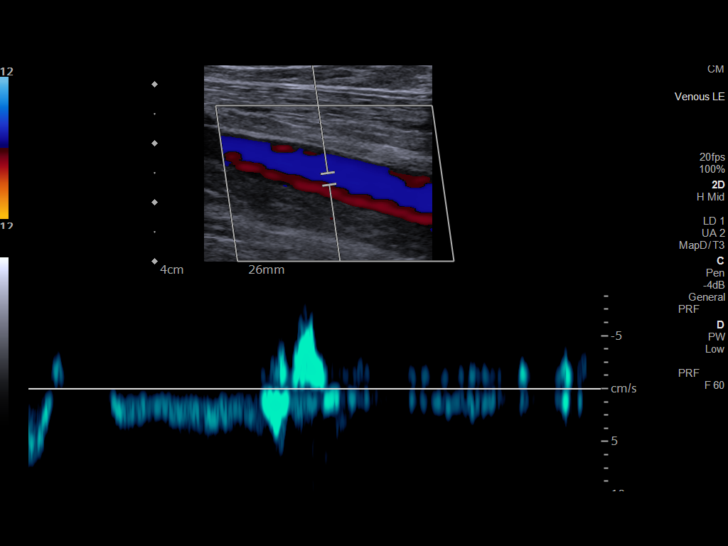
[im 23/35]
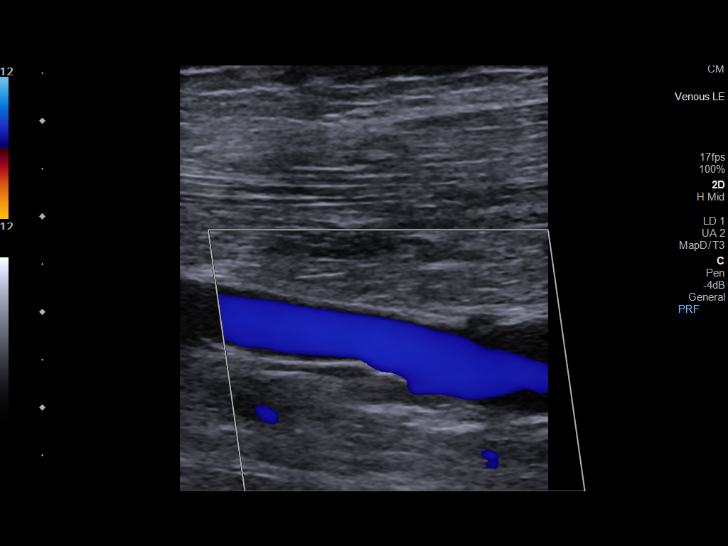
[im 26/35]
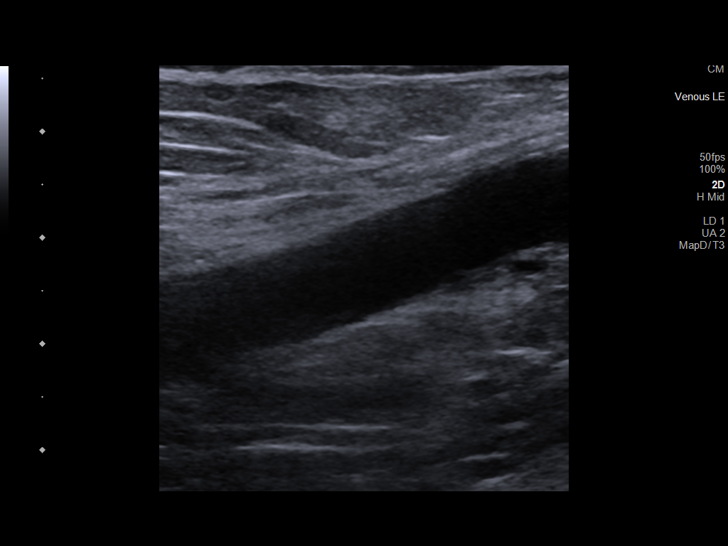
[im 29/35]
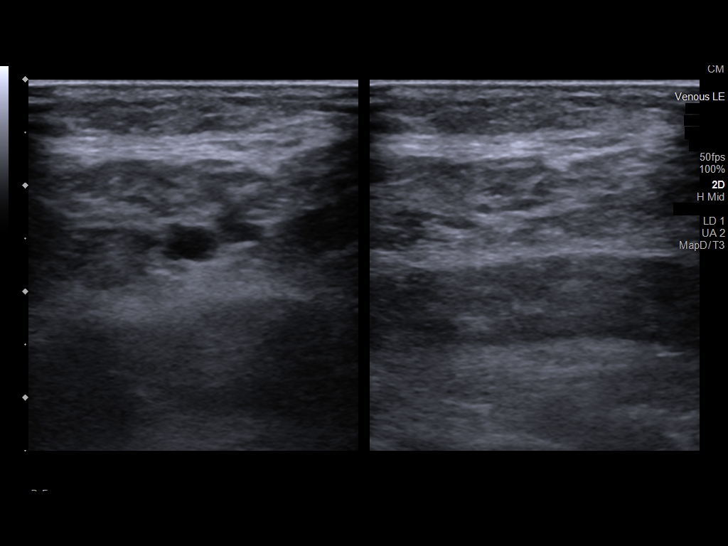
[im 32/35]
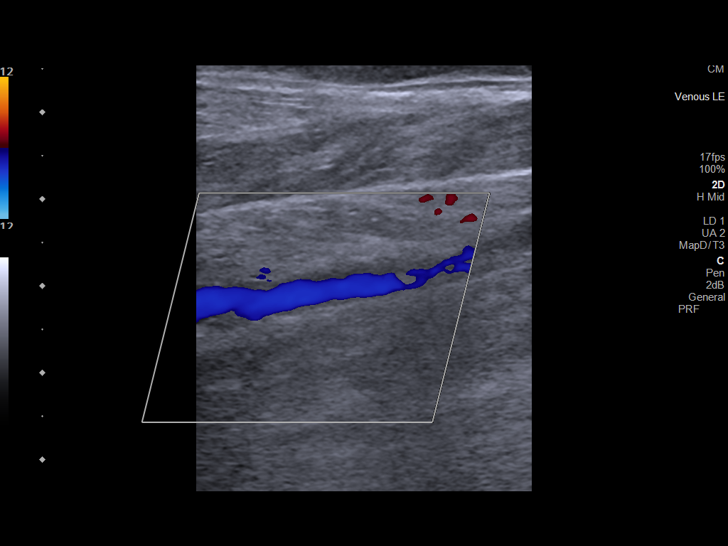
[im 35/35]
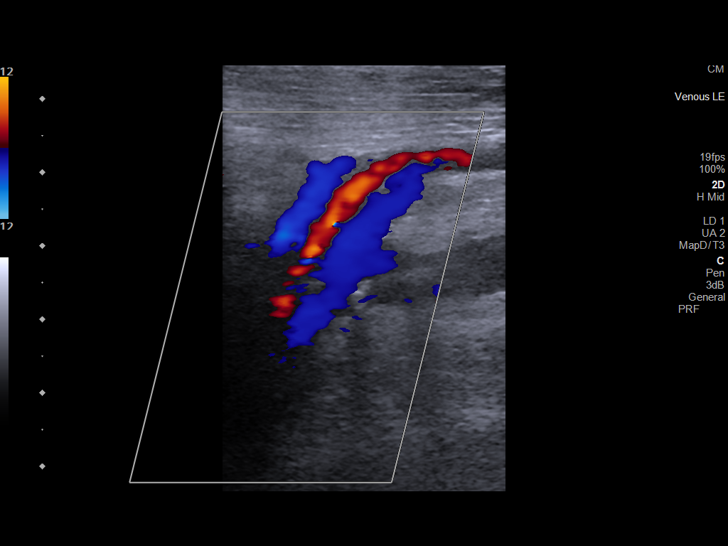

[13 of 24 positions shown; findings below may reference images not displayed]

FINDINGS: Contralateral Common Femoral Vein: Respiratory phasicity is normal
and symmetric with the symptomatic side. No evidence of thrombus.
Normal compressibility.

Common Femoral Vein: No evidence of thrombus. Normal
compressibility, respiratory phasicity and response to augmentation.

Saphenofemoral Junction: No evidence of thrombus. Normal
compressibility and flow on color Doppler imaging.

Profunda Femoral Vein: No evidence of thrombus. Normal
compressibility and flow on color Doppler imaging.

Femoral Vein: No evidence of thrombus. Normal compressibility,
respiratory phasicity and response to augmentation.

Popliteal Vein: No evidence of thrombus. Normal compressibility,
respiratory phasicity and response to augmentation.

Calf Veins: No evidence of thrombus. Normal compressibility and flow
on color Doppler imaging.

Superficial Great Saphenous Vein: No evidence of thrombus. Normal
compressibility.

Venous Reflux:  None.

Other Findings: No evidence of superficial thrombophlebitis or
abnormal fluid collection.
IMPRESSION: No evidence of left lower extremity deep venous thrombosis.

## 2020-08-03 ENCOUNTER — Emergency Department (HOSPITAL_COMMUNITY): Payer: Medicaid Other

## 2020-08-03 ENCOUNTER — Observation Stay (HOSPITAL_COMMUNITY)
Admission: EM | Admit: 2020-08-03 | Discharge: 2020-08-04 | Disposition: A | Discharge status: Home / Self Care | Payer: Medicaid Other | Attending: Internal Medicine | Admitting: Internal Medicine

## 2020-08-03 ENCOUNTER — Encounter (HOSPITAL_COMMUNITY): Payer: Self-pay

## 2020-08-03 DIAGNOSIS — J441 Chronic obstructive pulmonary disease with (acute) exacerbation: Secondary | ICD-10-CM | POA: Diagnosis not present

## 2020-08-03 DIAGNOSIS — F17210 Nicotine dependence, cigarettes, uncomplicated: Secondary | ICD-10-CM | POA: Diagnosis not present

## 2020-08-03 DIAGNOSIS — Z20822 Contact with and (suspected) exposure to covid-19: Secondary | ICD-10-CM | POA: Diagnosis not present

## 2020-08-03 DIAGNOSIS — E871 Hypo-osmolality and hyponatremia: Secondary | ICD-10-CM | POA: Diagnosis not present

## 2020-08-03 DIAGNOSIS — Z79899 Other long term (current) drug therapy: Secondary | ICD-10-CM | POA: Diagnosis not present

## 2020-08-03 DIAGNOSIS — J189 Pneumonia, unspecified organism: Secondary | ICD-10-CM

## 2020-08-03 DIAGNOSIS — R0602 Shortness of breath: Secondary | ICD-10-CM | POA: Diagnosis present

## 2020-08-03 DIAGNOSIS — E860 Dehydration: Secondary | ICD-10-CM | POA: Diagnosis not present

## 2020-08-03 DIAGNOSIS — E440 Moderate protein-calorie malnutrition: Secondary | ICD-10-CM | POA: Diagnosis present

## 2020-08-03 DIAGNOSIS — I5032 Chronic diastolic (congestive) heart failure: Secondary | ICD-10-CM | POA: Diagnosis not present

## 2020-08-03 DIAGNOSIS — R531 Weakness: Secondary | ICD-10-CM

## 2020-08-03 LAB — CBC WITH DIFFERENTIAL/PLATELET
Abs Immature Granulocytes: 0.04 10*3/uL (ref 0.00–0.07)
Basophils Absolute: 0 10*3/uL (ref 0.0–0.1)
Basophils Relative: 0 %
Eosinophils Absolute: 0 10*3/uL (ref 0.0–0.5)
Eosinophils Relative: 0 %
HCT: 44.6 % (ref 39.0–52.0)
Hemoglobin: 14.1 g/dL (ref 13.0–17.0)
Immature Granulocytes: 1 %
Lymphocytes Relative: 66 %
Lymphs Abs: 3.1 10*3/uL (ref 0.7–4.0)
MCH: 27.6 pg (ref 26.0–34.0)
MCHC: 31.6 g/dL (ref 30.0–36.0)
MCV: 87.5 fL (ref 80.0–100.0)
Monocytes Absolute: 0.7 10*3/uL (ref 0.1–1.0)
Monocytes Relative: 15 %
Neutro Abs: 0.9 10*3/uL — ABNORMAL LOW (ref 1.7–7.7)
Neutrophils Relative %: 18 %
Platelets: 109 10*3/uL — ABNORMAL LOW (ref 150–400)
RBC: 5.1 MIL/uL (ref 4.22–5.81)
RDW: 17.4 % — ABNORMAL HIGH (ref 11.5–15.5)
WBC: 4.7 10*3/uL (ref 4.0–10.5)
nRBC: 0.6 % — ABNORMAL HIGH (ref 0.0–0.2)

## 2020-08-03 LAB — PROTIME-INR
INR: 1.4 — ABNORMAL HIGH (ref 0.8–1.2)
Prothrombin Time: 16.7 seconds — ABNORMAL HIGH (ref 11.4–15.2)

## 2020-08-03 LAB — COMPREHENSIVE METABOLIC PANEL
ALT: 17 U/L (ref 0–44)
AST: 27 U/L (ref 15–41)
Albumin: 2.6 g/dL — ABNORMAL LOW (ref 3.5–5.0)
Alkaline Phosphatase: 144 U/L — ABNORMAL HIGH (ref 38–126)
Anion gap: 6 (ref 5–15)
BUN: 23 mg/dL — ABNORMAL HIGH (ref 6–20)
CO2: 24 mmol/L (ref 22–32)
Calcium: 7.7 mg/dL — ABNORMAL LOW (ref 8.9–10.3)
Chloride: 96 mmol/L — ABNORMAL LOW (ref 98–111)
Creatinine, Ser: 0.56 mg/dL — ABNORMAL LOW (ref 0.61–1.24)
GFR, Estimated: >60 mL/min (ref >60)
Glucose, Bld: 105 mg/dL — ABNORMAL HIGH (ref 70–99)
Potassium: 3.9 mmol/L (ref 3.5–5.1)
Sodium: 126 mmol/L — ABNORMAL LOW (ref 135–145)
Total Bilirubin: 0.9 mg/dL (ref 0.3–1.2)
Total Protein: 6.3 g/dL — ABNORMAL LOW (ref 6.5–8.1)

## 2020-08-03 LAB — BLOOD GAS, VENOUS
Acid-base deficit: 1.2 mmol/L (ref 0.0–2.0)
Bicarbonate: 23.5 mmol/L (ref 20.0–28.0)
FIO2: 32
O2 Saturation: 98 %
Patient temperature: 37
pCO2, Ven: 37.5 mmHg — ABNORMAL LOW (ref 44.0–60.0)
pH, Ven: 7.402 (ref 7.250–7.430)
pO2, Ven: 106 mmHg — ABNORMAL HIGH (ref 32.0–45.0)

## 2020-08-03 LAB — RESP PANEL BY RT-PCR (FLU A&B, COVID) ARPGX2
Influenza A by PCR: NEGATIVE
Influenza B by PCR: NEGATIVE
SARS Coronavirus 2 by RT PCR: NEGATIVE

## 2020-08-03 LAB — LACTIC ACID, PLASMA
Lactic Acid, Venous: 1.6 mmol/L (ref 0.5–1.9)
Lactic Acid, Venous: 0.9 mmol/L (ref 0.5–1.9)

## 2020-08-03 LAB — APTT: aPTT: 38 seconds — ABNORMAL HIGH (ref 24–36)

## 2020-08-03 IMAGING — DX DG CHEST 1V PORT
1 series · 2 of 2 positions shown · non-contrast
Comparison: [DATE]

CLINICAL DATA: Shortness of breath and low-grade fever with
possible sepsis.

EXAM:
PORTABLE CHEST 1 VIEW

[Series 1: chest ap · 0.14mm/px · 2 of 2 slices shown]
[im 1/2]
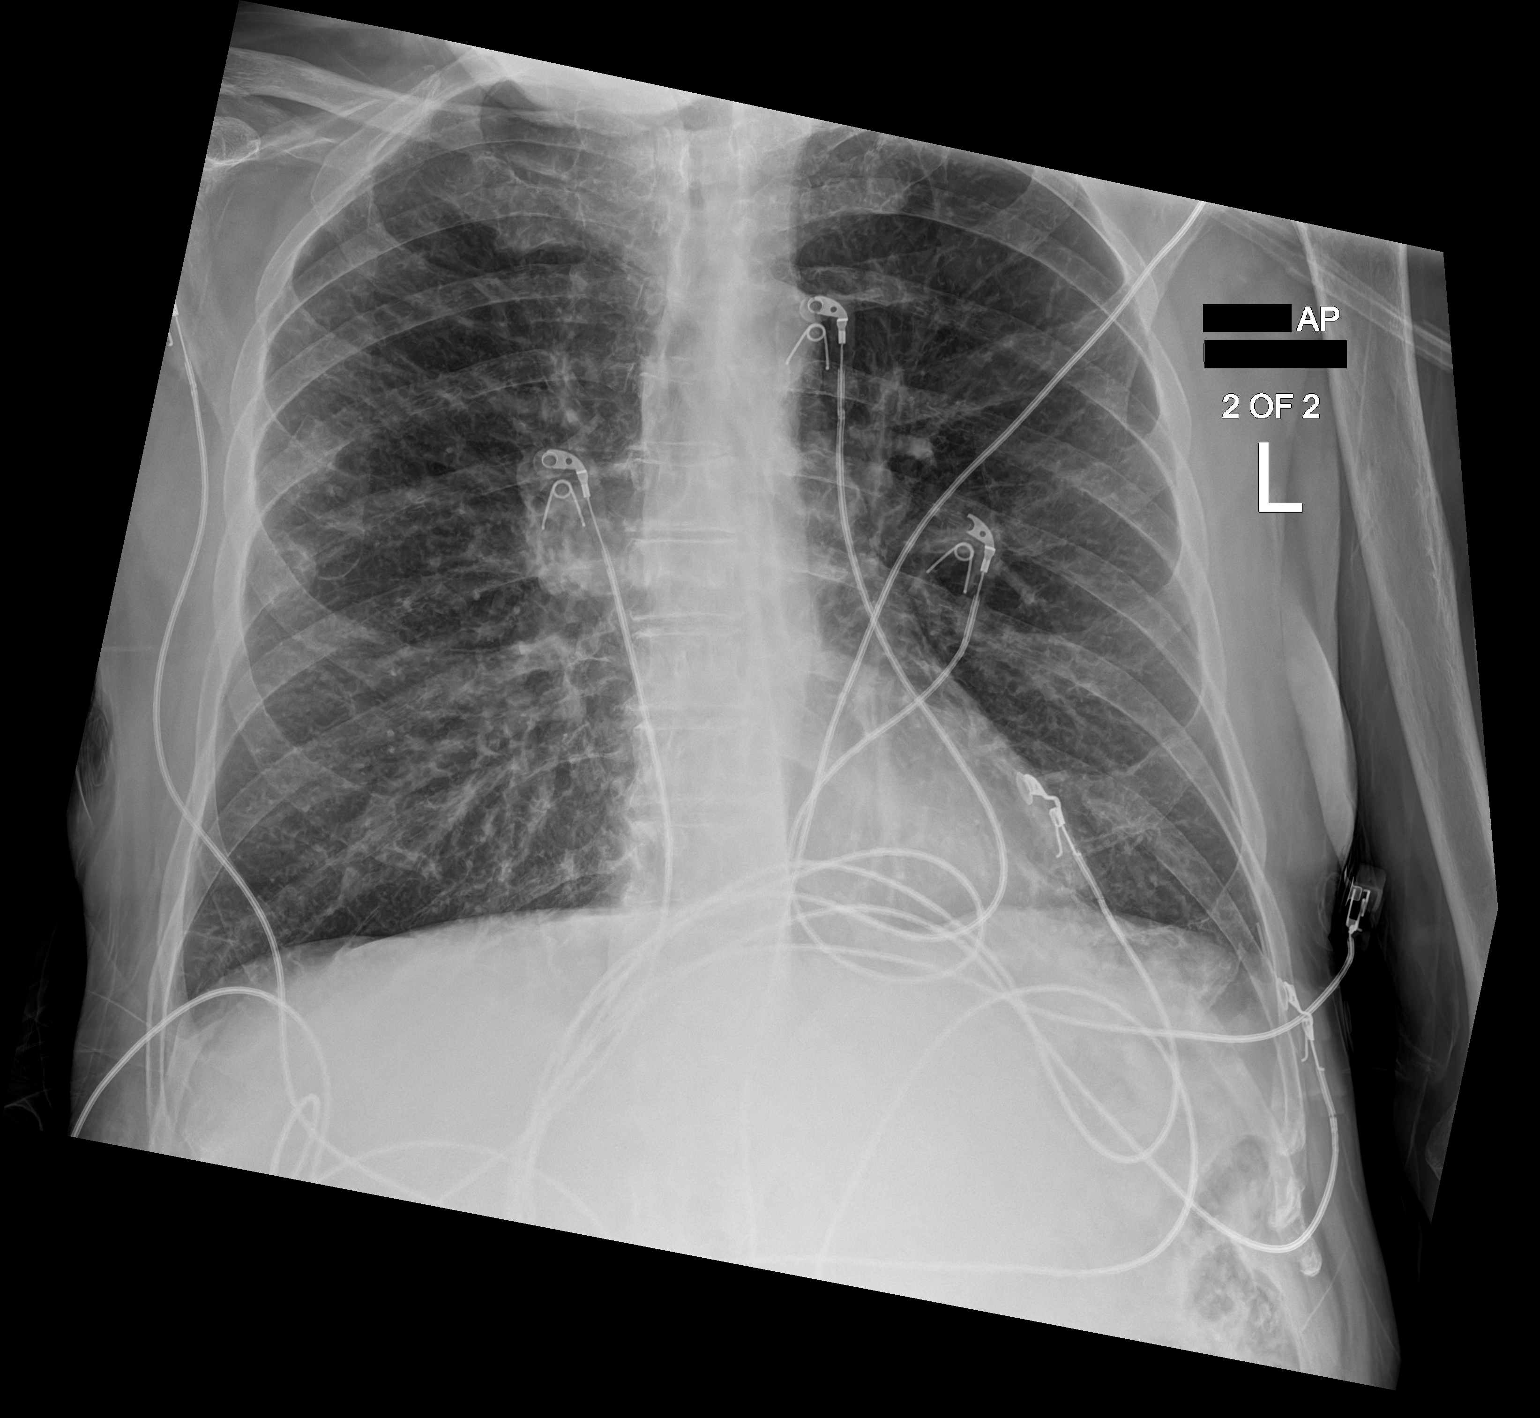
[im 2/2]
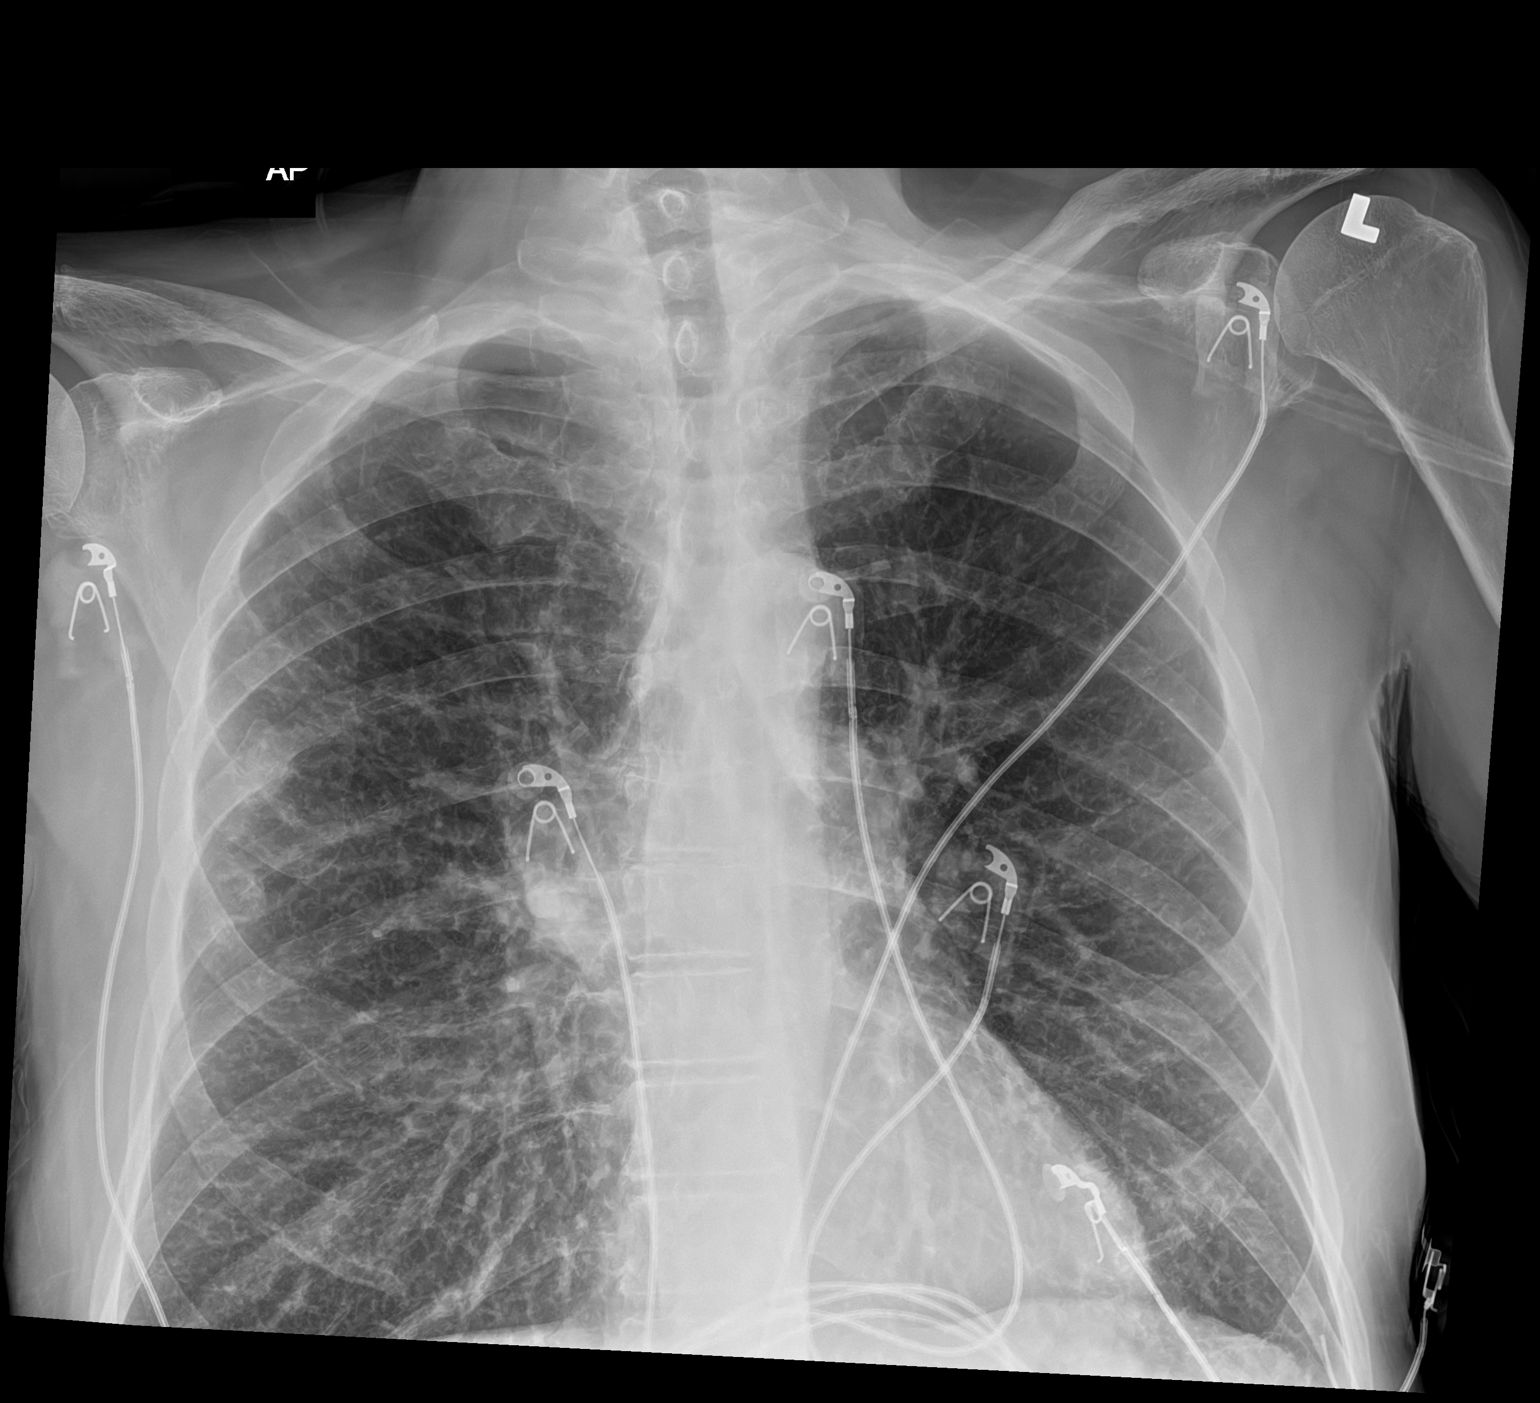

[2 of 2 positions shown; findings below may reference images not displayed]

FINDINGS: The lungs are hyperinflated. Diffuse, chronic appearing increased
interstitial lung markings are noted. There is no evidence of acute
infiltrate, pleural effusion or pneumothorax. The heart size and
mediastinal contours are within normal limits. Chronic sixth and
seventh right rib fractures are seen.
IMPRESSION: COPD without acute or active cardiopulmonary disease.

## 2020-08-03 MED ORDER — SODIUM CHLORIDE 0.9 % IV BOLUS
1000.0000 mL | Freq: Once | INTRAVENOUS | Status: AC
  Administered 2020-08-03: 1000 mL via INTRAVENOUS

## 2020-08-03 MED ORDER — SODIUM CHLORIDE 0.9 % IV SOLN: 500.0000 mg | Freq: Once | INTRAVENOUS | Status: DC

## 2020-08-03 MED ORDER — VANCOMYCIN HCL IN DEXTROSE 1-5 GM/200ML-% IV SOLN
1000.0000 mg | Freq: Once | INTRAVENOUS | Status: AC
  Administered 2020-08-03: 1000 mg via INTRAVENOUS
  Filled 2020-08-03: qty 200

## 2020-08-03 MED ORDER — SODIUM CHLORIDE 0.9 % IV SOLN
2.0000 g | Freq: Once | INTRAVENOUS | Status: AC
  Administered 2020-08-03: 2 g via INTRAVENOUS
  Filled 2020-08-03: qty 20

## 2020-08-03 MED ORDER — SODIUM CHLORIDE 0.9 % IV BOLUS: 1000.0000 mL | Freq: Once | INTRAVENOUS | Status: DC

## 2020-08-03 NOTE — ED Notes (Signed)
EKG done and given to Dr. Zammit 

## 2020-08-03 NOTE — ED Triage Notes (Signed)
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

## 2020-08-03 NOTE — ED Provider Notes (Signed)
[facility] EMERGENCY DEPARTMENT Provider Note   CSN: [id number] Arrival date & time: 08/03/20  1833     History Chief Complaint  Patient presents with  . Shortness of Breath    Marcus [name] is a 60 y.o. male.  Patient complains of fever and cough and weakness.  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 No language interpreter was used.  Shortness of Breath Severity:  Moderate Onset quality:  Sudden Timing:  Constant Progression:  Worsening Chronicity:  New Context: activity   Relieved by:  Nothing Associated symptoms: no abdominal pain, no chest pain, no cough, no headaches and no rash        Past Medical History:  Diagnosis Date  . COPD (chronic obstructive pulmonary disease) ([place])   . 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 Hernia, epigastric   . Pancytopenia ([place])   . Seropositive rheumatoid arthritis ([place])   . Tobacco use disorder     Patient Active Problem List   Diagnosis Date Noted  . Septic shock ([place]) 06/29/2020  . CAP (community acquired pneumonia) 06/29/2020  . COPD exacerbation ([place]) 06/11/2020  . Chest pain 06/11/2020  . Cellulitis 06/11/2020  . Chronic diastolic CHF (congestive heart failure) ([place]) 06/11/2020  . Severe sepsis with septic shock ([place]) 01/20/2020  . Acute hypoxemic respiratory failure ([place]) 01/20/2020  . Hypokalemia 01/20/2020  . Perforated prepyloric ulcer 01/18/2020 01/18/2020  . Pneumoperitoneum 01/17/2020  . COPD with acute exacerbation ([place]) 08/18/2019  . 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 Anemia 11/14/2015  . Seropositive rheumatoid arthritis ([place])   . Chronic obstructive pulmonary disease ([place])   . Protein-calorie malnutrition, severe 07/24/2015  . Pancytopenia ([place]) 07/23/2015  . 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[place]) 06/06/2013  . Leukopenia 10/21/2012  . 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 Abnormal EKG 10/21/2012  . Joint pain 10/21/2012  . Splenomegaly 10/21/2012     Past Surgical History:  Procedure Laterality Date  . INCISIONAL HERNIA REPAIR  01/17/2020   Procedure: HERNIA REPAIR INCISIONAL AND Silvestre Gunner;  Surgeon: Kinsinger, Arta Bruce, MD;  Location: [facility] ORS;  Service: General;;  . LAPAROTOMY N/A 01/17/2020   Procedure: EXPLORATORY LAPAROTOMY;  Surgeon: Kieth Brightly Arta Bruce, MD;  Location: [facility] ORS;  Service: General;  Laterality: N/A;       Family History  Problem Relation Age of Onset  . CAD Brother   . COPD Brother     Social History   Tobacco Use  . Smoking status: Current Every Day Smoker    Packs/day: 1.00    Types: Cigarettes  . Smokeless tobacco: Never Used  Substance Use Topics  . Alcohol use: No    Alcohol/week: 0.0 standard drinks  . Drug use: Yes    Types: Marijuana    Home Medications Prior to Admission medications   Medication Sig Start Date End Date Taking? Authorizing Provider  albuterol (PROVENTIL) (2.5 MG/3ML) 0.083% nebulizer solution Take 3 mLs (2.5 mg total) by nebulization every 6 (six) hours as needed for wheezing or shortness of breath. 01/24/20  Yes Kinsinger, Arta Bruce, MD  albuterol (VENTOLIN HFA) 108 (90 Base) MCG/ACT inhaler Inhale 2 puffs into the lungs every 6 (six) hours as needed for wheezing or shortness of breath.   Yes [provider]  pantoprazole (PROTONIX) 40 MG tablet Take 1 tablet (40 mg total) by mouth daily for 10 days. 07/02/20 07/12/20 Yes Shah, Pratik D, DO  predniSONE (DELTASONE) 10 MG tablet Take 10  mg by mouth daily with breakfast.   Yes [provider]  guaiFENesin-dextromethorphan (ROBITUSSIN DM) 100-10 MG/5ML syrup Take 5 mLs by mouth every 4 (four) hours as needed for cough. Patient not taking: Reported on 08/03/2020 07/01/20   Heath Lark D, DO  mometasone-formoterol (DULERA) 100-5 MCG/ACT AERO Inhale 2 puffs into the lungs 2 (two) times daily. Patient not taking: Reported on 08/03/2020 06/12/20   Bonnielee Haff, MD    Allergies    Levaquin [levofloxacin in  d5w] and Methotrexate  Review of Systems   Review of Systems  Constitutional: Negative for appetite change and fatigue.  HENT: Negative for congestion, ear discharge and sinus pressure.   Eyes: Negative for discharge.  Respiratory: Positive for shortness of breath. Negative for cough.   Cardiovascular: Negative for chest pain.  Gastrointestinal: Negative for abdominal pain and diarrhea.  Genitourinary: Negative for frequency and hematuria.  Musculoskeletal: Negative for back pain.  Skin: Negative for rash.  Neurological: Negative for seizures and headaches.  Psychiatric/Behavioral: Negative for hallucinations.    Physical Exam Updated Vital Signs BP 94/62   Pulse 69   Temp 99.8 F (37.7 C) (Oral)   Resp (!) 24   Ht 5\' 10"  (1.778 m)   Wt 64.4 kg   SpO2 98%   BMI 20.37 kg/m   Physical Exam Vitals and nursing note reviewed.  Constitutional:      Appearance: He is well-developed.  HENT:     Head: Normocephalic.     Nose: Nose normal.  Eyes:     General: No scleral icterus.    Conjunctiva/sclera: Conjunctivae normal.  Neck:     Thyroid: No thyromegaly.  Cardiovascular:     Rate and Rhythm: Normal rate and regular rhythm.     Heart sounds: No murmur heard. No friction rub. No gallop.   Pulmonary:     Breath sounds: No stridor. No wheezing or rales.  Chest:     Chest wall: No tenderness.  Abdominal:     General: There is no distension.     Tenderness: There is no abdominal tenderness. There is no rebound.  Musculoskeletal:        General: Normal range of motion.     Cervical back: Neck supple.  Lymphadenopathy:     Cervical: No cervical adenopathy.  Skin:    Findings: No erythema or rash.  Neurological:     Mental Status: He is oriented to person, place, and time.     Motor: No abnormal muscle tone.     Coordination: Coordination normal.  Psychiatric:        Behavior: Behavior normal.     ED Results / Procedures / Treatments   Labs (all labs ordered are  listed, but only abnormal results are displayed) Labs Reviewed  COMPREHENSIVE METABOLIC PANEL - Abnormal; Notable for the following components:      Result Value   Sodium 126 (*)    Chloride 96 (*)    Glucose, Bld 105 (*)    BUN 23 (*)    Creatinine, Ser 0.56 (*)    Calcium 7.7 (*)    Total Protein 6.3 (*)    Albumin 2.6 (*)    Alkaline Phosphatase 144 (*)    All other components within normal limits  CBC WITH DIFFERENTIAL/PLATELET - Abnormal; Notable for the following components:   RDW 17.4 (*)    Platelets 109 (*)    nRBC 0.6 (*)    Neutro Abs 0.9 (*)    All other components within  normal limits  PROTIME-INR - Abnormal; Notable for the following components:   Prothrombin Time 16.7 (*)    INR 1.4 (*)    All other components within normal limits  APTT - Abnormal; Notable for the following components:   aPTT 38 (*)    All other components within normal limits  CULTURE, BLOOD (SINGLE)  RESP PANEL BY RT-PCR (FLU A&B, COVID) [id number]  URINE CULTURE  LACTIC ACID, PLASMA  LACTIC ACID, PLASMA  URINALYSIS, ROUTINE W REFLEX MICROSCOPIC    EKG None  Radiology DG Chest Port 1 View  Result Date: 08/03/2020 CLINICAL DATA:  Shortness of breath and low-grade fever with possible sepsis. EXAM: PORTABLE CHEST 1 VIEW COMPARISON:  June 29, 2020 FINDINGS: The lungs are hyperinflated. Diffuse, chronic appearing increased interstitial lung markings are noted. There is no evidence of acute infiltrate, pleural effusion or pneumothorax. The heart size and mediastinal contours are within normal limits. Chronic sixth and seventh right rib fractures are seen. IMPRESSION: COPD without acute or active cardiopulmonary disease. Electronically Signed   By: Virgina Norfolk M.D.   On: 08/03/2020 19:24    Procedures Procedures   Medications Ordered in ED Medications  sodium chloride 0.9 % bolus 1,000 mL (has no administration in time range)  vancomycin (VANCOCIN) IVPB 1000 mg/200 mL premix (has no  administration in time range)  sodium chloride 0.9 % bolus 1,000 mL (1,000 mLs Intravenous New Bag/Given 08/03/20 2011)  cefTRIAXone (ROCEPHIN) 2 g in sodium chloride 0.9 % 100 mL IVPB (2 g Intravenous New Bag/Given 08/03/20 2018)  sodium chloride 0.9 % bolus 1,000 mL (1,000 mLs Intravenous New Bag/Given 08/03/20 2018)    ED Course  I have reviewed the triage vital signs and the nursing notes.  Pertinent labs & imaging results that were available during my care of the patient were reviewed by me and considered in my medical decision making (see chart for details). CRITICAL CARE Performed by: Milton Ferguson Total critical care time: 45 minutes Critical care time was exclusive of separately billable procedures and treating other patients. Critical care was necessary to treat or prevent imminent or life-threatening deterioration. Critical care was time spent personally by me on the following activities: development of treatment plan with patient and/or surrogate as well as nursing, discussions with consultants, evaluation of patient's response to treatment, examination of patient, obtaining history from patient or surrogate, ordering and performing treatments and interventions, ordering and review of laboratory studies, ordering and review of radiographic studies, pulse oximetry and re-evaluation of patient's condition.    MDM Rules/Calculators/A&P                          Patient is admitted for probable respiratory infection hyponatremia hypotension and possible sepsis Final Clinical Impression(s) / ED Diagnoses Final diagnoses:  COPD exacerbation ([place])  Hyponatremia    Rx / DC Orders ED Discharge Orders    None       Milton Ferguson, MD 08/04/20 1514

## 2020-08-04 ENCOUNTER — Other Ambulatory Visit: Payer: Self-pay

## 2020-08-04 ENCOUNTER — Observation Stay (HOSPITAL_COMMUNITY): Payer: Medicaid Other

## 2020-08-04 DIAGNOSIS — E871 Hypo-osmolality and hyponatremia: Secondary | ICD-10-CM | POA: Diagnosis present

## 2020-08-04 DIAGNOSIS — R531 Weakness: Secondary | ICD-10-CM

## 2020-08-04 DIAGNOSIS — E860 Dehydration: Secondary | ICD-10-CM | POA: Diagnosis not present

## 2020-08-04 LAB — URINALYSIS, ROUTINE W REFLEX MICROSCOPIC
Bilirubin Urine: NEGATIVE
Glucose, UA: NEGATIVE mg/dL
Hgb urine dipstick: NEGATIVE
Ketones, ur: NEGATIVE mg/dL
Leukocytes,Ua: NEGATIVE
Nitrite: NEGATIVE
Protein, ur: NEGATIVE mg/dL
Specific Gravity, Urine: 1.009 (ref 1.005–1.030)
pH: 6 (ref 5.0–8.0)

## 2020-08-04 LAB — COMPREHENSIVE METABOLIC PANEL
ALT: 15 U/L (ref 0–44)
AST: 21 U/L (ref 15–41)
Albumin: 2.1 g/dL — ABNORMAL LOW (ref 3.5–5.0)
Alkaline Phosphatase: 105 U/L (ref 38–126)
Anion gap: 4 — ABNORMAL LOW (ref 5–15)
BUN: 17 mg/dL (ref 6–20)
CO2: 24 mmol/L (ref 22–32)
Calcium: 7.2 mg/dL — ABNORMAL LOW (ref 8.9–10.3)
Chloride: 105 mmol/L (ref 98–111)
Creatinine, Ser: 0.3 mg/dL — ABNORMAL LOW (ref 0.61–1.24)
GFR, Estimated: >60 mL/min (ref >60)
Glucose, Bld: 81 mg/dL (ref 70–99)
Potassium: 3.3 mmol/L — ABNORMAL LOW (ref 3.5–5.1)
Sodium: 133 mmol/L — ABNORMAL LOW (ref 135–145)
Total Bilirubin: 0.7 mg/dL (ref 0.3–1.2)
Total Protein: 5.4 g/dL — ABNORMAL LOW (ref 6.5–8.1)

## 2020-08-04 LAB — CBC WITH DIFFERENTIAL/PLATELET
Abs Immature Granulocytes: 0.01 10*3/uL (ref 0.00–0.07)
Basophils Absolute: 0 10*3/uL (ref 0.0–0.1)
Basophils Relative: 1 %
Eosinophils Absolute: 0 10*3/uL (ref 0.0–0.5)
Eosinophils Relative: 0 %
HCT: 39.1 % (ref 39.0–52.0)
Hemoglobin: 12.5 g/dL — ABNORMAL LOW (ref 13.0–17.0)
Immature Granulocytes: 0 %
Lymphocytes Relative: 57 %
Lymphs Abs: 2.1 10*3/uL (ref 0.7–4.0)
MCH: 28.2 pg (ref 26.0–34.0)
MCHC: 32 g/dL (ref 30.0–36.0)
MCV: 88.3 fL (ref 80.0–100.0)
Monocytes Absolute: 0.5 10*3/uL (ref 0.1–1.0)
Monocytes Relative: 13 %
Neutro Abs: 1.1 10*3/uL — ABNORMAL LOW (ref 1.7–7.7)
Neutrophils Relative %: 29 %
Platelets: 116 10*3/uL — ABNORMAL LOW (ref 150–400)
RBC: 4.43 MIL/uL (ref 4.22–5.81)
RDW: 17.4 % — ABNORMAL HIGH (ref 11.5–15.5)
WBC: 3.7 10*3/uL — ABNORMAL LOW (ref 4.0–10.5)
nRBC: 0.5 % — ABNORMAL HIGH (ref 0.0–0.2)

## 2020-08-04 LAB — MAGNESIUM: Magnesium: 1.8 mg/dL (ref 1.7–2.4)

## 2020-08-04 LAB — PROCALCITONIN: Procalcitonin: 0.41 ng/mL

## 2020-08-04 IMAGING — DX DG CHEST 1V PORT
1 series · 1 of 1 positions shown · non-contrast
Comparison: [DATE] and [DATE]

CLINICAL DATA: Pneumonia.

EXAM:
PORTABLE CHEST 1 VIEW

[chest ap]
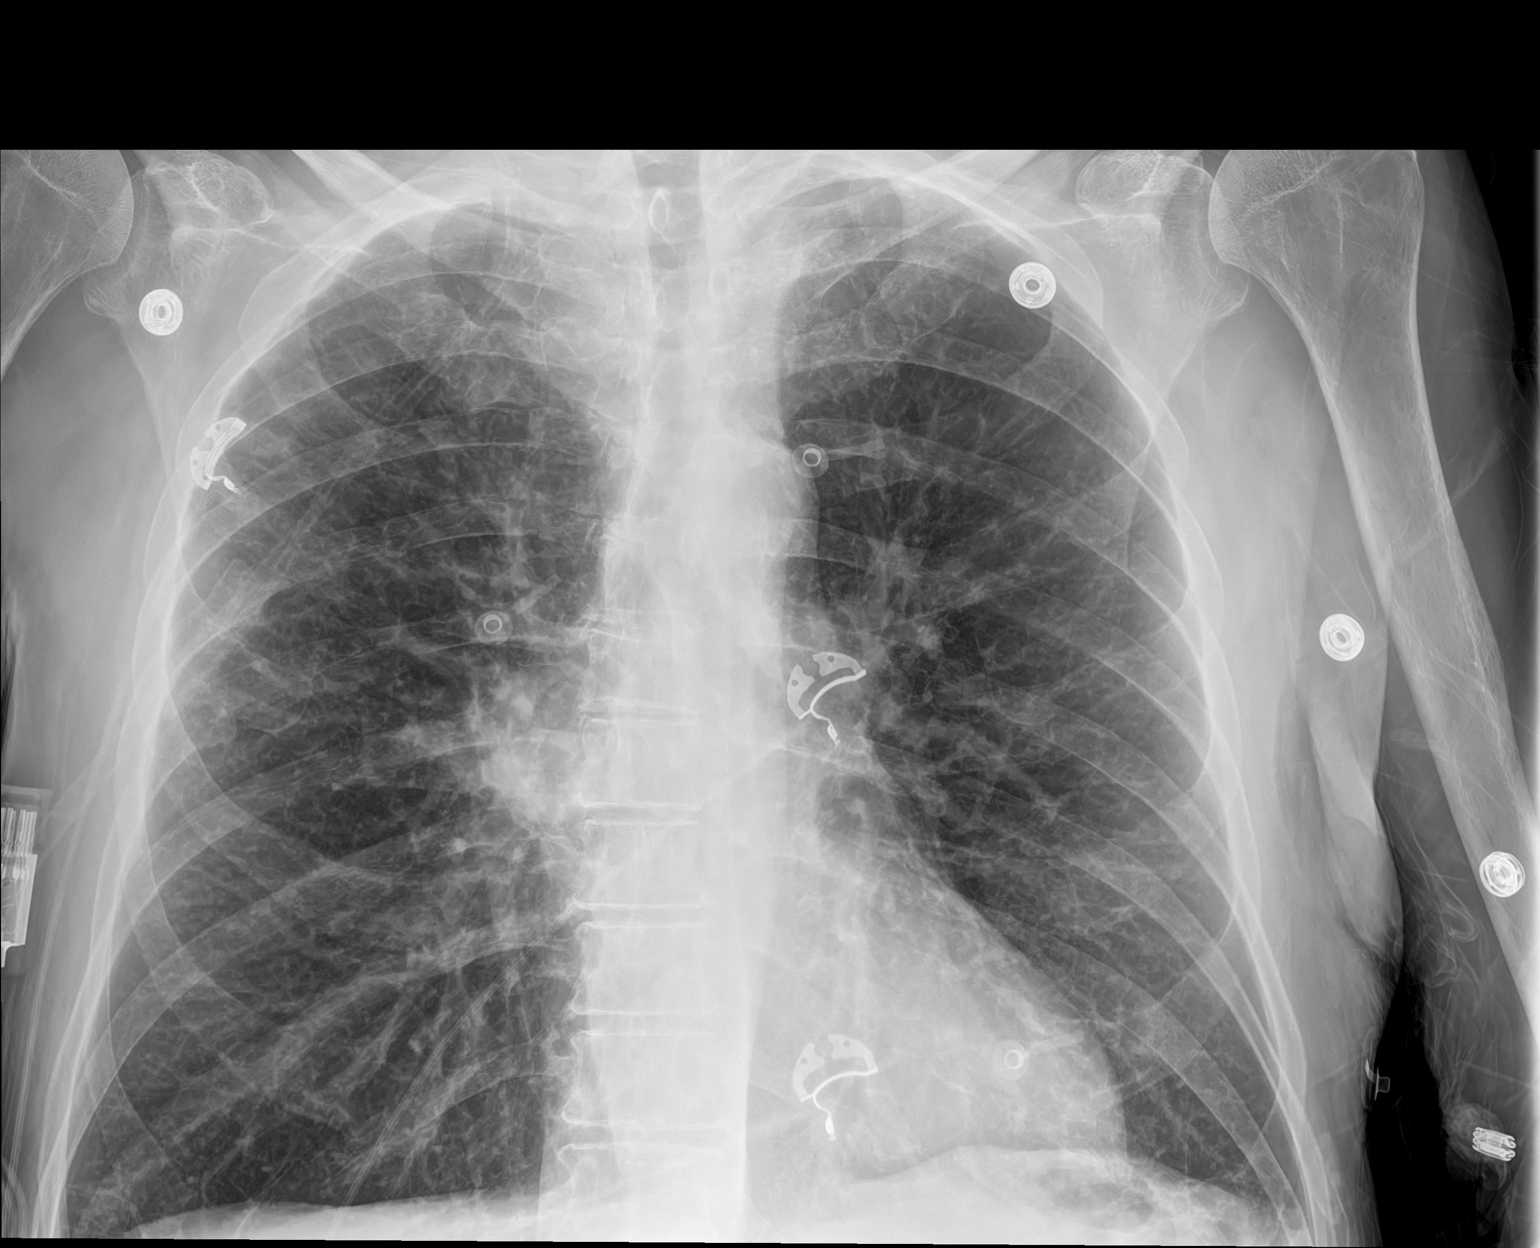

[1 of 1 positions shown; findings below may reference images not displayed]

FINDINGS: Chronic coarse lung markings. No new airspace disease. Heart size is
stable. Trachea is midline. Old right rib fractures. Negative for a
pneumothorax.
IMPRESSION: Chronic lung changes without acute findings.

## 2020-08-04 MED ORDER — ACETAMINOPHEN 650 MG RE SUPP: 650.0000 mg | Freq: Four times a day (QID) | RECTAL | Status: DC | PRN

## 2020-08-04 MED ORDER — ALBUTEROL SULFATE (2.5 MG/3ML) 0.083% IN NEBU: 2.5000 mg | INHALATION_SOLUTION | Freq: Four times a day (QID) | RESPIRATORY_TRACT | Status: DC | PRN

## 2020-08-04 MED ORDER — AZITHROMYCIN 250 MG PO TABS: ORAL_TABLET | ORAL | 0 refills | Status: AC

## 2020-08-04 MED ORDER — ONDANSETRON HCL 4 MG/2ML IJ SOLN: 4.0000 mg | Freq: Four times a day (QID) | INTRAMUSCULAR | Status: DC | PRN

## 2020-08-04 MED ORDER — PREDNISONE 10 MG PO TABS
10.0000 mg | ORAL_TABLET | Freq: Every day | ORAL | Status: DC
  Administered 2020-08-04: 10 mg via ORAL
  Filled 2020-08-04: qty 1

## 2020-08-04 MED ORDER — POTASSIUM CHLORIDE CRYS ER 20 MEQ PO TBCR
40.0000 meq | EXTENDED_RELEASE_TABLET | Freq: Once | ORAL | Status: AC
  Administered 2020-08-04: 40 meq via ORAL
  Filled 2020-08-04: qty 2

## 2020-08-04 MED ORDER — OXYCODONE HCL 5 MG PO TABS: 5.0000 mg | ORAL_TABLET | ORAL | Status: DC | PRN

## 2020-08-04 MED ORDER — ACETAMINOPHEN 325 MG PO TABS: 650.0000 mg | ORAL_TABLET | Freq: Four times a day (QID) | ORAL | Status: DC | PRN

## 2020-08-04 MED ORDER — ALBUTEROL SULFATE HFA 108 (90 BASE) MCG/ACT IN AERS: 2.0000 | INHALATION_SPRAY | Freq: Four times a day (QID) | RESPIRATORY_TRACT | 2 refills | Status: DC | PRN

## 2020-08-04 MED ORDER — HEPARIN SODIUM (PORCINE) 5000 UNIT/ML IJ SOLN
5000.0000 [IU] | Freq: Three times a day (TID) | INTRAMUSCULAR | Status: DC
  Administered 2020-08-04: 5000 [IU] via SUBCUTANEOUS
  Filled 2020-08-04 (×2): qty 1

## 2020-08-04 MED ORDER — SODIUM CHLORIDE 0.9 % IV SOLN
500.0000 mg | INTRAVENOUS | Status: DC
  Administered 2020-08-04: 500 mg via INTRAVENOUS
  Filled 2020-08-04: qty 500

## 2020-08-04 MED ORDER — SODIUM CHLORIDE 0.9 % IV SOLN: 2.0000 g | INTRAVENOUS | Status: DC

## 2020-08-04 MED ORDER — ONDANSETRON HCL 4 MG PO TABS: 4.0000 mg | ORAL_TABLET | Freq: Four times a day (QID) | ORAL | Status: DC | PRN

## 2020-08-04 MED ORDER — NICOTINE 21 MG/24HR TD PT24
21.0000 mg | MEDICATED_PATCH | Freq: Every day | TRANSDERMAL | Status: DC
  Administered 2020-08-04: 21 mg via TRANSDERMAL
  Filled 2020-08-04: qty 1

## 2020-08-04 MED ORDER — ENSURE ENLIVE PO LIQD: 237.0000 mL | Freq: Two times a day (BID) | ORAL | 12 refills | Status: DC

## 2020-08-04 MED ORDER — ENSURE ENLIVE PO LIQD: 237.0000 mL | Freq: Two times a day (BID) | ORAL | Status: DC

## 2020-08-04 NOTE — Discharge Summary (Signed)
Physician Discharge Summary  Marcus [name] TKW:[id number] DOB: February 17, 1961 DOA: 08/03/2020  PCP: Patient, No Pcp Per (Inactive)  Admit date: 08/03/2020  Discharge date: 08/04/2020  Admitted From:Home  Disposition:  Home  Recommendations for Outpatient Follow-up:  1. Follow up with PCP in 1-2 weeks 2. Continue on Azithromycin as prescribed 3. Continue on home medications as prior  Home Health: Yes with home PT  Equipment/Devices:Has home O2  Discharge Condition:Stable  CODE STATUS: Full  Diet recommendation: Heart Healthy  Brief/Interim Summary: Marcus [name]  is a 60 y.o. male, with history of known tobacco use disorder, rheumatoid arthritis, pancytopenia, COPD, Felty syndrome, and more presents to the ED with a chief complaint of generalized weakness.  It is unclear exactly why the patient was admitted to the hospital.  He states that he may have had some mild weakness, but denies any other significant symptoms.  He continues to have a chronic cough that is not usual for him.  His laboratory data did reveal some hyponatremia, but he has chronic hyponatremia and this is not unusual.  On account of his weakness, PT evaluation was performed related recommend some home health PT which will be arranged for him.  Lastly, his procalcitonin was slightly elevated and despite the fact that his chest x-ray was negative, there is a small possibility he could have some mild bacterial bronchitis for which she will be discharged with azithromycin as prescribed.  He has also required a refill on his inhaler which will be sent to his pharmacy.  No other acute events noted throughout the course of his stay.  He is stable for discharge.  Discharge Diagnoses:  Principal Problem:   Dehydration Active Problems:   Moderate protein-calorie malnutrition (HCC)   Hyponatremia   Generalized weakness  Principal discharge diagnosis: Generalized weakness likely multifactorial in the setting of hyponatremia as well as  possible bacterial bronchitis.  Discharge Instructions  Discharge Instructions    Diet - low sodium heart healthy   Complete by: As directed    Increase activity slowly   Complete by: As directed      Allergies as of 08/04/2020      Reactions   Levaquin [levofloxacin In D5w] Other (See Comments)   Chest pain   Methotrexate Other (See Comments)   Chest pain      Medication List    TAKE these medications   albuterol (2.5 MG/3ML) 0.083% nebulizer solution Commonly known as: PROVENTIL Take 3 mLs (2.5 mg total) by nebulization every 6 (six) hours as needed for wheezing or shortness of breath.   albuterol 108 (90 Base) MCG/ACT inhaler Commonly known as: VENTOLIN HFA Inhale 2 puffs into the lungs every 6 (six) hours as needed for wheezing or shortness of breath.   azithromycin 250 MG tablet Commonly known as: Zithromax Z-Pak Take 2 tablets (500 mg) on  Day 1,  followed by 1 tablet (250 mg) once daily on Days 2 through 5.   feeding supplement Liqd Take 237 mLs by mouth 2 (two) times daily between meals.   guaiFENesin-dextromethorphan 100-10 MG/5ML syrup Commonly known as: ROBITUSSIN DM Take 5 mLs by mouth every 4 (four) hours as needed for cough.   mometasone-formoterol 100-5 MCG/ACT Aero Commonly known as: DULERA Inhale 2 puffs into the lungs 2 (two) times daily.   pantoprazole 40 MG tablet Commonly known as: PROTONIX Take 1 tablet (40 mg total) by mouth daily for 10 days.   predniSONE 10 MG tablet Commonly known as: DELTASONE Take 10 mg by  mouth daily with breakfast.       Follow-up Information    pcp. Schedule an appointment as soon as possible for a visit in 1 week(s).              Allergies  Allergen Reactions  . Levaquin [Levofloxacin In D5w] Other (See Comments)    Chest pain  . Methotrexate Other (See Comments)    Chest pain    Consultations:  None   Procedures/Studies: US Venous Img Lower Unilateral Left (DVT)  Result Date:  07/24/2020 CLINICAL DATA:  Left lower extremity edema. EXAM: LEFT LOWER EXTREMITY VENOUS DOPPLER ULTRASOUND TECHNIQUE: Gray-scale sonography with graded compression, as well as color Doppler and duplex ultrasound were performed to evaluate the lower extremity deep venous systems from the level of the common femoral vein and including the common femoral, femoral, profunda femoral, popliteal and calf veins including the posterior tibial, peroneal and gastrocnemius veins when visible. The superficial great saphenous vein was also interrogated. Spectral Doppler was utilized to evaluate flow at rest and with distal augmentation maneuvers in the common femoral, femoral and popliteal veins. COMPARISON:  None. FINDINGS: Contralateral Common Femoral Vein: Respiratory phasicity is normal and symmetric with the symptomatic side. No evidence of thrombus. Normal compressibility. Common Femoral Vein: No evidence of thrombus. Normal compressibility, respiratory phasicity and response to augmentation. Saphenofemoral Junction: No evidence of thrombus. Normal compressibility and flow on color Doppler imaging. Profunda Femoral Vein: No evidence of thrombus. Normal compressibility and flow on color Doppler imaging. Femoral Vein: No evidence of thrombus. Normal compressibility, respiratory phasicity and response to augmentation. Popliteal Vein: No evidence of thrombus. Normal compressibility, respiratory phasicity and response to augmentation. Calf Veins: No evidence of thrombus. Normal compressibility and flow on color Doppler imaging. Superficial Great Saphenous Vein: No evidence of thrombus. Normal compressibility. Venous Reflux:  None. Other Findings: No evidence of superficial thrombophlebitis or abnormal fluid collection. IMPRESSION: No evidence of left lower extremity deep venous thrombosis. Electronically Signed   By: Aletta Edouard M.D.   On: 07/24/2020 16:08   Portable chest 1 View  Result Date: 08/04/2020 CLINICAL DATA:   Pneumonia. EXAM: PORTABLE CHEST 1 VIEW COMPARISON:  08/03/2020 and 06/29/2020 FINDINGS: Chronic coarse lung markings. No new airspace disease. Heart size is stable. Trachea is midline. Old right rib fractures. Negative for a pneumothorax. IMPRESSION: Chronic lung changes without acute findings. Electronically Signed   By: Markus Daft M.D.   On: 08/04/2020 12:24   DG Chest Port 1 View  Result Date: 08/03/2020 CLINICAL DATA:  Shortness of breath and low-grade fever with possible sepsis. EXAM: PORTABLE CHEST 1 VIEW COMPARISON:  June 29, 2020 FINDINGS: The lungs are hyperinflated. Diffuse, chronic appearing increased interstitial lung markings are noted. There is no evidence of acute infiltrate, pleural effusion or pneumothorax. The heart size and mediastinal contours are within normal limits. Chronic sixth and seventh right rib fractures are seen. IMPRESSION: COPD without acute or active cardiopulmonary disease. Electronically Signed   By: Virgina Norfolk M.D.   On: 08/03/2020 19:24      Discharge Exam: Vitals:   08/04/20 0050 08/04/20 0547  BP: 105/63 98/62  Pulse: 61 63  Resp: 19 19  Temp: 97.8 F (36.6 C) 98.6 F (37 C)  SpO2: 95% 96%   Vitals:   08/03/20 2330 08/04/20 0000 08/04/20 0050 08/04/20 0547  BP: (!) 98/56 108/68 105/63 98/62  Pulse: 61 64 61 63  Resp: 20 (!) 23 19 19   Temp:   97.8 F (36.6 C) 98.6  F (37 C)  TempSrc:      SpO2: 96% 95% 95% 96%  Weight:   63.8 kg   Height:   5\' 10"  (1.778 m)     General: Pt is alert, awake, not in acute distress Cardiovascular: RRR, S1/S2 +, no rubs, no gallops Respiratory: CTA bilaterally, no wheezing, no rhonchi, currently on nasal cannula Abdominal: Soft, NT, ND, bowel sounds + Extremities: no edema, no cyanosis    The results of significant diagnostics from this hospitalization (including imaging, microbiology, ancillary and laboratory) are listed below for reference.     Microbiology: Recent Results (from the past 240  hour(s))  Resp Panel by RT-PCR (Flu A&B, Covid) Nasopharyngeal Swab     Status: None   Collection Time: 08/03/20  7:15 PM   Specimen: Nasopharyngeal Swab; Nasopharyngeal(NP) swabs in vial transport medium  Result Value Ref Range Status   SARS Coronavirus 2 by RT PCR NEGATIVE NEGATIVE Final    Comment: (NOTE) SARS-CoV-2 target nucleic acids are NOT DETECTED.  The SARS-CoV-2 RNA is generally detectable in upper respiratory specimens during the acute phase of infection. The lowest concentration of SARS-CoV-2 viral copies this assay can detect is 138 copies/mL. A negative result does not preclude SARS-Cov-2 infection and should not be used as the sole basis for treatment or other patient management decisions. A negative result may occur with  improper specimen collection/handling, submission of specimen other than nasopharyngeal swab, presence of viral mutation(s) within the areas targeted by this assay, and inadequate number of viral copies(<138 copies/mL). A negative result must be combined with clinical observations, patient history, and epidemiological information. The expected result is Negative.  Fact Sheet for Patients:  EntrepreneurPulse.com.au  Fact Sheet for Healthcare Providers:  IncredibleEmployment.be  This test is no t yet approved or cleared by the [place] FDA and  has been authorized for detection and/or diagnosis of SARS-CoV-2 by FDA under an Emergency Use Authorization (EUA). This EUA will remain  in effect (meaning this test can be used) for the duration of the COVID-19 declaration under Section 564(b)(1) of the Act, 21 U.S.C.section 360bbb-3(b)(1), unless the authorization is terminated  or revoked sooner.       Influenza A by PCR NEGATIVE NEGATIVE Final   Influenza B by PCR NEGATIVE NEGATIVE Final    Comment: (NOTE) The Xpert Xpress SARS-CoV-2/FLU/RSV plus assay is intended as an aid in the diagnosis of influenza from  Nasopharyngeal swab specimens and should not be used as a sole basis for treatment. Nasal washings and aspirates are unacceptable for Xpert Xpress SARS-CoV-2/FLU/RSV testing.  Fact Sheet for Patients: EntrepreneurPulse.com.au  Fact Sheet for Healthcare Providers: IncredibleEmployment.be  This test is not yet approved or cleared by the [place] FDA and has been authorized for detection and/or diagnosis of SARS-CoV-2 by FDA under an Emergency Use Authorization (EUA). This EUA will remain in effect (meaning this test can be used) for the duration of the COVID-19 declaration under Section 564(b)(1) of the Act, 21 U.S.C. section 360bbb-3(b)(1), unless the authorization is terminated or revoked.  Performed at [facility], [address] [place], [place] [address]   Blood culture (routine single)     Status: None (Preliminary result)   Collection Time: 08/03/20  7:17 PM   Specimen: BLOOD RIGHT ARM  Result Value Ref Range Status   Specimen Description BLOOD RIGHT ARM  Final   Special Requests   Final    Blood Culture adequate volume BOTTLES DRAWN AEROBIC AND ANAEROBIC   Culture   Final  NO GROWTH < 24 HOURS Performed at [facility], [address], [place], [place] [address]    Report Status PENDING  Incomplete     Labs: BNP (last 3 results) No results for input(s): BNP in the last 8760 hours. Basic Metabolic Panel: Recent Labs  Lab 08/03/20 1917 08/04/20 0525  NA 126* 133*  K 3.9 3.3*  CL 96* 105  CO2 24 24  GLUCOSE 105* 81  BUN 23* 17  CREATININE 0.56* 0.30*  CALCIUM 7.7* 7.2*  MG  --  1.8   Liver Function Tests: Recent Labs  Lab 08/03/20 1917 08/04/20 0525  AST 27 21  ALT 17 15  ALKPHOS 144* 105  BILITOT 0.9 0.7  PROT 6.3* 5.4*  ALBUMIN 2.6* 2.1*   No results for input(s): LIPASE, AMYLASE in the last 168 hours. No results for input(s): AMMONIA in the last 168 hours. CBC: Recent Labs  Lab 08/03/20 1917  08/04/20 0525  WBC 4.7 3.7*  NEUTROABS 0.9* 1.1*  HGB 14.1 12.5*  HCT 44.6 39.1  MCV 87.5 88.3  PLT 109* 116*   Cardiac Enzymes: No results for input(s): CKTOTAL, CKMB, CKMBINDEX, TROPONINI in the last 168 hours. BNP: Invalid input(s): POCBNP CBG: No results for input(s): GLUCAP in the last 168 hours. D-Dimer No results for input(s): DDIMER in the last 72 hours. Hgb A1c No results for input(s): HGBA1C in the last 72 hours. Lipid Profile No results for input(s): CHOL, HDL, LDLCALC, TRIG, CHOLHDL, LDLDIRECT in the last 72 hours. Thyroid function studies No results for input(s): TSH, T4TOTAL, T3FREE, THYROIDAB in the last 72 hours.  Invalid input(s): FREET3 Anemia work up No results for input(s): VITAMINB12, FOLATE, FERRITIN, TIBC, IRON, RETICCTPCT in the last 72 hours. Urinalysis    Component Value Date/Time   COLORURINE YELLOW 08/04/2020 0024   APPEARANCEUR CLEAR 08/04/2020 0024   LABSPEC 1.009 08/04/2020 0024   PHURINE 6.0 08/04/2020 0024   GLUCOSEU NEGATIVE 08/04/2020 0024   HGBUR NEGATIVE 08/04/2020 0024   BILIRUBINUR NEGATIVE 08/04/2020 0024   KETONESUR NEGATIVE 08/04/2020 0024   PROTEINUR NEGATIVE 08/04/2020 0024   UROBILINOGEN 0.2 10/21/2012 1737   NITRITE NEGATIVE 08/04/2020 0024   LEUKOCYTESUR NEGATIVE 08/04/2020 0024   Sepsis Labs Invalid input(s): PROCALCITONIN,  WBC,  LACTICIDVEN Microbiology Recent Results (from the past 240 hour(s))  Resp Panel by RT-PCR (Flu A&B, Covid) Nasopharyngeal Swab     Status: None   Collection Time: 08/03/20  7:15 PM   Specimen: Nasopharyngeal Swab; Nasopharyngeal(NP) swabs in vial transport medium  Result Value Ref Range Status   SARS Coronavirus 2 by RT PCR NEGATIVE NEGATIVE Final    Comment: (NOTE) SARS-CoV-2 target nucleic acids are NOT DETECTED.  The SARS-CoV-2 RNA is generally detectable in upper respiratory specimens during the acute phase of infection. The lowest concentration of SARS-CoV-2 viral copies this assay  can detect is 138 copies/mL. A negative result does not preclude SARS-Cov-2 infection and should not be used as the sole basis for treatment or other patient management decisions. A negative result may occur with  improper specimen collection/handling, submission of specimen other than nasopharyngeal swab, presence of viral mutation(s) within the areas targeted by this assay, and inadequate number of viral copies(<138 copies/mL). A negative result must be combined with clinical observations, patient history, and epidemiological information. The expected result is Negative.  Fact Sheet for Patients:  EntrepreneurPulse.com.au  Fact Sheet for Healthcare Providers:  IncredibleEmployment.be  This test is no t yet approved or cleared by the [place] and  has been authorized for detection and/or diagnosis of SARS-CoV-2 by FDA under an Emergency Use Authorization (EUA). This EUA will remain  in effect (meaning this test can be used) for the duration of the COVID-19 declaration under Section 564(b)(1) of the Act, 21 U.S.C.section 360bbb-3(b)(1), unless the authorization is terminated  or revoked sooner.       Influenza A by PCR NEGATIVE NEGATIVE Final   Influenza B by PCR NEGATIVE NEGATIVE Final    Comment: (NOTE) The Xpert Xpress SARS-CoV-2/FLU/RSV plus assay is intended as an aid in the diagnosis of influenza from Nasopharyngeal swab specimens and should not be used as a sole basis for treatment. Nasal washings and aspirates are unacceptable for Xpert Xpress SARS-CoV-2/FLU/RSV testing.  Fact Sheet for Patients: EntrepreneurPulse.com.au  Fact Sheet for Healthcare Providers: IncredibleEmployment.be  This test is not yet approved or cleared by the [place] FDA and has been authorized for detection and/or diagnosis of SARS-CoV-2 by FDA under an Emergency Use Authorization (EUA). This EUA will  remain in effect (meaning this test can be used) for the duration of the COVID-19 declaration under Section 564(b)(1) of the Act, 21 U.S.C. section 360bbb-3(b)(1), unless the authorization is terminated or revoked.  Performed at [facility], [address], [place], [place] [address]   Blood culture (routine single)     Status: None (Preliminary result)   Collection Time: 08/03/20  7:17 PM   Specimen: BLOOD RIGHT ARM  Result Value Ref Range Status   Specimen Description BLOOD RIGHT ARM  Final   Special Requests   Final    Blood Culture adequate volume BOTTLES DRAWN AEROBIC AND ANAEROBIC   Culture   Final    NO GROWTH < 24 HOURS Performed at [facility], [address] [place], [place] [address]    Report Status PENDING  Incomplete     Time coordinating discharge: 35 minutes  SIGNED:   Rodena Goldmann, DO [facility] 08/04/2020, 2:46 PM  If 7PM-7AM, please contact night-coverage [email or web address]

## 2020-08-04 NOTE — Plan of Care (Signed)

## 2020-08-04 NOTE — Evaluation (Signed)
Physical Therapy Evaluation Patient Details Name: Marcus [name] MRN: [id number] DOB: 1961-03-25 Today's Date: 08/04/2020   History of Present Illness  Marcus [name]  is a 60 y.o. male, with history of known tobacco use disorder, rheumatoid arthritis, pancytopenia, COPD, Felty syndrome, and more presents to the ED with a chief complaint of generalized weakness.  History is quite limited as patient reports to me that he does not know why he came in.  When reminded that the ED provider and mention that he had fever, cough, weakness he reports that he does remember coughing and did have generalized weakness.  Patient reports that his cough is been no different than his normal smoker's cough.  He reports that it is productive of white bubbly mucus.  Patient reports no fevers.  He reports shortness of breath that is at his baseline, and he wears oxygen at home.  He thinks maybe he has been working harder to breathe than normal, but is unsure of the onset of the symptoms.  He reports that he has been very fatigued and weak, and this is the only symptom he really seems to relate to.  He is unsure of the onset of this as well.  He reports has had more difficulty taking care of himself because of this fatigue.  Patient does live with his mom.  On review of systems he admits to some abdominal pain that is chronic.  He admits to some wheezing that is also chronic.  He denies using his inhalers any more often than normal.  No further complaints at this time.     Patient is a current smoker, does not drink alcohol does not use illicit drugs.  He is vaccinated for COVID.  He is full code   Clinical Impression   Patient exhibits pronounced generalized weakness, reduced functional activity tolerance, increased need for caregiver assistance, unsteadiness on feet, difficulty in walking, and balance deficits leading to high risk for falls.  Pt would benefit from continued PT services to improve functional activity tolerance and  improve ambulation/balance to facilitate safe D/C to home environment with further follow-up recommended for continued conditioning and assistance to restore capabilities to PLOF.     Follow Up Recommendations Home health PT    Equipment Recommendations  None recommended by PT (pt reports he has DME at home)    Recommendations for Other Services       Precautions / Restrictions        Mobility  Bed Mobility Overal bed mobility: Modified Independent Bed Mobility: Sit to Supine;Supine to Sit   Sidelying to sit: Modified independent (Device/Increase time) Supine to sit: Modified independent (Device/Increase time) Sit to supine: Modified independent (Device/Increase time)     Patient Response: Anxious  Transfers Overall transfer level: Needs assistance Equipment used: Rolling walker (2 wheeled) Transfers: Sit to/from [facility] Sit to Stand: Min guard Stand pivot transfers: Min guard       General transfer comment: unsteadiness due to generalized weakness and fatigue  Ambulation/Gait Ambulation/Gait assistance: Min guard Gait Distance (Feet): 20 Feet Assistive device: Rolling walker (2 wheeled) Gait Pattern/deviations: Wide base of support Gait velocity: decreased   General Gait Details: exertion and fatigue exacerbated with ambulation. Frequent stopping and forward lean on AD or wall railing  Stairs Stairs:  (not attempted due to fatigue/exertion)          Wheelchair Mobility    Modified Rankin (Stroke Patients Only)       Balance Overall balance assessment: Needs  assistance   Sitting balance-Leahy Scale: Good     Standing balance support: Bilateral upper extremity supported Standing balance-Leahy Scale: Fair   Single Leg Stance - Right Leg: 1 Single Leg Stance - Left Leg: 2         High level balance activites: Side stepping High Level Balance Comments: unsteadiness throughout             Pertinent Vitals/Pain Pain  Assessment: No/denies pain    Home Living Family/patient expects to be discharged to:: Private residence Living Arrangements: Parent Available Help at Discharge: Family Type of Home: House Home Access: Stairs to enter Entrance Stairs-Rails: Right;Left;Can reach both Entrance Stairs-Number of Steps: 4 Home Layout: One level Home Equipment: Environmental consultant - 2 wheels;Cane - single point      Prior Function Level of Independence: Independent         Comments: Full community ambulator without assistive device. Drives. Pt fully independent with ADLs/IADLs but mother helps out with household responsibilities.  Pt reports that his mother is 63 years old but "gets around better than I do."     Hand Dominance   Dominant Hand: Left    Extremity/Trunk Assessment   Upper Extremity Assessment Upper Extremity Assessment: Generalized weakness    Lower Extremity Assessment Lower Extremity Assessment: Generalized weakness       Communication   Communication: No difficulties  Cognition Arousal/Alertness: Awake/alert Behavior During Therapy: WFL for tasks assessed/performed Overall Cognitive Status: Within Functional Limits for tasks assessed                                        General Comments      Exercises General Exercises - Lower Extremity Ankle Circles/Pumps: AROM;Both;20 reps Quad Sets: Strengthening;Both;20 reps Long Arc Quad: Strengthening;Both;20 reps Heel Slides: AROM;Both;20 reps   Assessment/Plan    PT Assessment Patient needs continued PT services  PT Problem List Decreased strength;Decreased activity tolerance;Decreased balance;Decreased mobility;Cardiopulmonary status limiting activity       PT Treatment Interventions DME instruction;Gait training;Stair training;Functional mobility training;Therapeutic activities;Patient/family education;Balance training;Therapeutic exercise;Manual techniques    PT Goals (Current goals can be found in the Care  Plan section)  Acute Rehab PT Goals Patient Stated Goal: "Get my strength back" PT Goal Formulation: With patient Time For Goal Achievement: 08/11/20 Potential to Achieve Goals: Good    Frequency Min 3X/week   Barriers to discharge Other (comment) pt lives with elderly mother. Limited support other than her reported    Co-evaluation               AM-PAC PT "6 Clicks" Mobility  Outcome Measure Help needed turning from your back to your side while in a flat bed without using bedrails?: None Help needed moving from lying on your back to sitting on the side of a flat bed without using bedrails?: None Help needed moving to and from a bed to a chair (including a wheelchair)?: A Little Help needed standing up from a chair using your arms (e.g., wheelchair or bedside chair)?: A Little Help needed to walk in hospital room?: A Lot Help needed climbing 3-5 steps with a railing? : A Lot 6 Click Score: 18    End of Session Equipment Utilized During Treatment: Gait belt Activity Tolerance: Patient limited by fatigue Patient left: in bed;with call bell/phone within reach;with bed alarm set Nurse Communication: Mobility status PT Visit Diagnosis: Unsteadiness on feet (R26.81);Muscle weakness (  generalized) (M62.81);Difficulty in walking, not elsewhere classified (R26.2)    Time: 0930-1000 PT Time Calculation (min) (ACUTE ONLY): 30 min   Charges:   PT Evaluation $PT Eval Low Complexity: 1 Low PT Treatments $Therapeutic Exercise: 8-22 mins $Therapeutic Activity: 8-22 mins       11:12 AM, 08/04/20 M. Sherlyn Lees, PT, DPT Physical Therapist- [place] Office Number: [phone]

## 2020-08-04 NOTE — Plan of Care (Signed)
  Problem: Acute Rehab PT Goals(only PT should resolve) Goal: Patient Will Transfer Sit To/From Stand Outcome: Progressing Flowsheets (Taken 08/04/2020 1114) Patient will transfer sit to/from stand: with modified independence Goal: Pt Will Transfer Bed To Chair/Chair To Bed Outcome: Progressing Flowsheets (Taken 08/04/2020 1114) Pt will Transfer Bed to Chair/Chair to Bed: with modified independence Goal: Pt Will Ambulate Outcome: Progressing Flowsheets (Taken 08/04/2020 1114) Pt will Ambulate:  with supervision  > 125 feet  with rolling walker Goal: Pt Will Go Up/Down Stairs Outcome: Progressing Flowsheets (Taken 08/04/2020 1114) Pt will Go Up / Down Stairs:  3-5 stairs  with supervision  with rail(s) Goal: Pt/caregiver will Perform Home Exercise Program Outcome: Progressing Flowsheets (Taken 08/04/2020 1114) Pt/caregiver will Perform Home Exercise Program:  For increased strengthening  Independently   11:15 AM, 08/04/20 M. Sherlyn Lees, PT, DPT Physical Therapist- [place] Office Number: [phone]

## 2020-08-04 NOTE — H&P (Signed)
TRH H&P    Patient Demographics:    Marcus [name], is a 60 y.o. male  MRN: [id number]  DOB - 15-Mar-1961  Admit Date - 08/03/2020  Referring MD/NP/PA: Roderic Palau  Outpatient Primary MD for the patient is Patient, No Pcp Per (Inactive)  Patient coming from: Home-lives with mother  Chief complaint-generalized weakness   HPI:    Marcus [name]  is a 60 y.o. male, with history of known tobacco use disorder, rheumatoid arthritis, pancytopenia, COPD, Felty syndrome, and more presents to the ED with a chief complaint of generalized weakness.  History is quite limited as patient reports to me that he does not know why he came in.  When reminded that the ED provider and mention that he had fever, cough, weakness he reports that he does remember coughing and did have generalized weakness.  Patient reports that his cough is been no different than his normal smoker's cough.  He reports that it is productive of white bubbly mucus.  Patient reports no fevers.  He reports shortness of breath that is at his baseline, and he wears oxygen at home.  He thinks maybe he has been working harder to breathe than normal, but is unsure of the onset of the symptoms.  He reports that he has been very fatigued and weak, and this is the only symptom he really seems to relate to.  He is unsure of the onset of this as well.  He reports has had more difficulty taking care of himself because of this fatigue.  Patient does live with his mom.  On review of systems he admits to some abdominal pain that is chronic.  He admits to some wheezing that is also chronic.  He denies using his inhalers any more often than normal.  No further complaints at this time.  Patient is a current smoker, does not drink alcohol does not use illicit drugs.  He is vaccinated for COVID.  He is full code.  In the ED Temp 98.8, heart rate 69-1 06, respiratory rate 20-29, blood pressure  94/62, satting at 98% on 3 L nasal cannula Near leukopenia with a white blood cell count of 4.7, hemoglobin stable at 14.1 Hyponatremia and chemistry panel 126 Lactic acid 0.9-1.6 Negative respiratory panel Blood culture pending Rocephin and vancomycin given in the ED EKG shows a heart rate of 87, normal sinus rhythm, QTC 502, right bundle Chest x-ray shows COPD without acute changes Admission requested for observation since patient has borderline leukopenia, borderline hypotension.    Review of systems:    In addition to the HPI above,  No Fever-chills, No Headache, No changes with Vision or hearing, No problems swallowing food or Liquids, No change in cough or shortness of breath, no chest pain  no Abdominal pain, No Nausea or Vomiting, bowel movements are regular, No Blood in stool or Urine, No dysuria, No new skin rashes or bruises, No new joints pains-aches,  No new asymmetric weakness, tingling, numbness in any extremity, No recent weight gain or loss, No polyuria, polydypsia or polyphagia, No  significant Mental Stressors.  All other systems reviewed and are negative.    Past History of the following :    Past Medical History:  Diagnosis Date  . COPD (chronic obstructive pulmonary disease) ([place])   . 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 Hernia, epigastric   . Pancytopenia ([place])   . Seropositive rheumatoid arthritis ([place])   . Tobacco use disorder       Past Surgical History:  Procedure Laterality Date  . INCISIONAL HERNIA REPAIR  01/17/2020   Procedure: HERNIA REPAIR INCISIONAL AND Silvestre Gunner;  Surgeon: Kinsinger, Arta Bruce, MD;  Location: [facility];  Service: General;;  . LAPAROTOMY N/A 01/17/2020   Procedure: EXPLORATORY LAPAROTOMY;  Surgeon: Kieth Brightly Arta Bruce, MD;  Location: [facility];  Service: General;  Laterality: N/A;      Social History:      Social History   Tobacco Use  . Smoking status: Current Every Day Smoker    Packs/day: 1.00    Types: Cigarettes   . Smokeless tobacco: Never Used  Substance Use Topics  . Alcohol use: No    Alcohol/week: 0.0 standard drinks       Family History :     Family History  Problem Relation Age of Onset  . CAD Brother   . COPD Brother      Home Medications:   Prior to Admission medications   Medication Sig Start Date End Date Taking? Authorizing Provider  albuterol (PROVENTIL) (2.5 MG/3ML) 0.083% nebulizer solution Take 3 mLs (2.5 mg total) by nebulization every 6 (six) hours as needed for wheezing or shortness of breath. 01/24/20  Yes Kinsinger, Arta Bruce, MD  albuterol (VENTOLIN HFA) 108 (90 Base) MCG/ACT inhaler Inhale 2 puffs into the lungs every 6 (six) hours as needed for wheezing or shortness of breath.   Yes [provider]  pantoprazole (PROTONIX) 40 MG tablet Take 1 tablet (40 mg total) by mouth daily for 10 days. 07/02/20 07/12/20 Yes Shah, Pratik D, DO  predniSONE (DELTASONE) 10 MG tablet Take 10 mg by mouth daily with breakfast.   Yes [provider]  guaiFENesin-dextromethorphan (ROBITUSSIN DM) 100-10 MG/5ML syrup Take 5 mLs by mouth every 4 (four) hours as needed for cough. Patient not taking: Reported on 08/03/2020 07/01/20   Heath Lark D, DO  mometasone-formoterol (DULERA) 100-5 MCG/ACT AERO Inhale 2 puffs into the lungs 2 (two) times daily. Patient not taking: Reported on 08/03/2020 06/12/20   Bonnielee Haff, MD     Allergies:     Allergies  Allergen Reactions  . Levaquin [Levofloxacin In D5w] Other (See Comments)    Chest pain  . Methotrexate Other (See Comments)    Chest pain     Physical Exam:   Vitals  Blood pressure 105/63, pulse 61, temperature 97.8 F (36.6 C), resp. rate 19, height 5\' 10"  (1.778 m), weight 63.8 kg, SpO2 95 %.  1.  General: Patient sitting on edge of bed, no acute distress appears much older than stated age  23. Psychiatric: Flat affect, behavior normal for situation, alert oriented x3, cooperative with exam  3.  Neurologic: Face is symmetric, speech and language normal, moves all 4 extremities voluntarily, oriented x3, no acute deficits on limited exam  4. HEENMT:  Head is atraumatic, normocephalic, pupils reactive to light, neck is supple, trachea is midline, mucous membranes are moist  5. Respiratory : Barrel chest, breathing comfortably on 3 L nasal cannula without accessory muscle use or increased work of breathing, wheezes bilaterally, no rhonchi no  crackles, no cyanosis, clubbing present  6. Cardiovascular : Heart rate is normal, rhythm is regular, no murmurs rubs or gallops, no peripheral edema  7. Gastrointestinal:  Abdomen is soft, nondistended, nontender to palpation, no palpable masses, bowel sounds active  8. Skin:  Skin is warm dry and intact without acute lesion on limited exam  9.Musculoskeletal:  No acute deformity, no calf tenderness, no peripheral edema    Data Review:    CBC Recent Labs  Lab 08/03/20 1917  WBC 4.7  HGB 14.1  HCT 44.6  PLT 109*  MCV 87.5  MCH 27.6  MCHC 31.6  RDW 17.4*  LYMPHSABS 3.1  MONOABS 0.7  EOSABS 0.0  BASOSABS 0.0   ------------------------------------------------------------------------------------------------------------------  Results for orders placed or performed during the hospital encounter of 08/03/20 (from the past 48 hour(s))  Resp Panel by RT-PCR (Flu A&B, Covid) Nasopharyngeal Swab     Status: None   Collection Time: 08/03/20  7:15 PM   Specimen: Nasopharyngeal Swab; Nasopharyngeal(NP) swabs in vial transport medium  Result Value Ref Range   SARS Coronavirus 2 by RT PCR NEGATIVE NEGATIVE    Comment: (NOTE) SARS-CoV-2 target nucleic acids are NOT DETECTED.  The SARS-CoV-2 RNA is generally detectable in upper respiratory specimens during the acute phase of infection. The lowest concentration of SARS-CoV-2 viral copies this assay can detect is 138 copies/mL. A negative result does not preclude SARS-Cov-2 infection and  should not be used as the sole basis for treatment or other patient management decisions. A negative result may occur with  improper specimen collection/handling, submission of specimen other than nasopharyngeal swab, presence of viral mutation(s) within the areas targeted by this assay, and inadequate number of viral copies(<138 copies/mL). A negative result must be combined with clinical observations, patient history, and epidemiological information. The expected result is Negative.  Fact Sheet for Patients:  EntrepreneurPulse.com.au  Fact Sheet for Healthcare Providers:  IncredibleEmployment.be  This test is no t yet approved or cleared by the [place] FDA and  has been authorized for detection and/or diagnosis of SARS-CoV-2 by FDA under an Emergency Use Authorization (EUA). This EUA will remain  in effect (meaning this test can be used) for the duration of the COVID-19 declaration under Section 564(b)(1) of the Act, 21 U.S.C.section 360bbb-3(b)(1), unless the authorization is terminated  or revoked sooner.       Influenza A by PCR NEGATIVE NEGATIVE   Influenza B by PCR NEGATIVE NEGATIVE    Comment: (NOTE) The Xpert Xpress SARS-CoV-2/FLU/RSV plus assay is intended as an aid in the diagnosis of influenza from Nasopharyngeal swab specimens and should not be used as a sole basis for treatment. Nasal washings and aspirates are unacceptable for Xpert Xpress SARS-CoV-2/FLU/RSV testing.  Fact Sheet for Patients: EntrepreneurPulse.com.au  Fact Sheet for Healthcare Providers: IncredibleEmployment.be  This test is not yet approved or cleared by the [place] FDA and has been authorized for detection and/or diagnosis of SARS-CoV-2 by FDA under an Emergency Use Authorization (EUA). This EUA will remain in effect (meaning this test can be used) for the duration of the COVID-19 declaration under Section  564(b)(1) of the Act, 21 U.S.C. section 360bbb-3(b)(1), unless the authorization is terminated or revoked.  Performed at [facility], [address] [place], [place] [address]   Lactic acid, plasma     Status: None   Collection Time: 08/03/20  7:17 PM  Result Value Ref Range   Lactic Acid, Venous 1.6 0.5 - 1.9 mmol/L    Comment: Performed  at [facility], [address], [place], [place] [address]  Comprehensive metabolic panel     Status: Abnormal   Collection Time: 08/03/20  7:17 PM  Result Value Ref Range   Sodium 126 (L) 135 - 145 mmol/L   Potassium 3.9 3.5 - 5.1 mmol/L   Chloride 96 (L) 98 - 111 mmol/L   CO2 24 22 - 32 mmol/L   Glucose, Bld 105 (H) 70 - 99 mg/dL    Comment: Glucose reference range applies only to samples taken after fasting for at least 8 hours.   BUN 23 (H) 6 - 20 mg/dL   Creatinine, Ser 0.56 (L) 0.61 - 1.24 mg/dL   Calcium 7.7 (L) 8.9 - 10.3 mg/dL   Total Protein 6.3 (L) 6.5 - 8.1 g/dL   Albumin 2.6 (L) 3.5 - 5.0 g/dL   AST 27 15 - 41 U/L   ALT 17 0 - 44 U/L   Alkaline Phosphatase 144 (H) 38 - 126 U/L   Total Bilirubin 0.9 0.3 - 1.2 mg/dL   GFR, Estimated >60 >60 mL/min    Comment: (NOTE) Calculated using the CKD-EPI Creatinine Equation (2021)    Anion gap 6 5 - 15    Comment: Performed at [facility], [address], [place], [place] [address]  CBC WITH DIFFERENTIAL     Status: Abnormal   Collection Time: 08/03/20  7:17 PM  Result Value Ref Range   WBC 4.7 4.0 - 10.5 K/uL   RBC 5.10 4.22 - 5.81 MIL/uL   Hemoglobin 14.1 13.0 - 17.0 g/dL   HCT 44.6 39.0 - 52.0 %   MCV 87.5 80.0 - 100.0 fL   MCH 27.6 26.0 - 34.0 pg   MCHC 31.6 30.0 - 36.0 g/dL   RDW 17.4 (H) 11.5 - 15.5 %   Platelets 109 (L) 150 - 400 K/uL    Comment: SPECIMEN CHECKED FOR CLOTS PLATELET COUNT CONFIRMED BY SMEAR    nRBC 0.6 (H) 0.0 - 0.2 %   Neutrophils Relative % 18 %   Neutro Abs 0.9 (L) 1.7 - 7.7 K/uL   Lymphocytes Relative 66 %   Lymphs Abs 3.1 0.7 - 4.0 K/uL   Monocytes  Relative 15 %   Monocytes Absolute 0.7 0.1 - 1.0 K/uL   Eosinophils Relative 0 %   Eosinophils Absolute 0.0 0.0 - 0.5 K/uL   Basophils Relative 0 %   Basophils Absolute 0.0 0.0 - 0.1 K/uL   Immature Granulocytes 1 %   Abs Immature Granulocytes 0.04 0.00 - 0.07 K/uL    Comment: Performed at [facility], [address], [place], [place] [address]  Blood culture (routine single)     Status: None (Preliminary result)   Collection Time: 08/03/20  7:17 PM   Specimen: BLOOD RIGHT ARM  Result Value Ref Range   Specimen Description BLOOD RIGHT ARM    Special Requests      Blood Culture adequate volume BOTTLES DRAWN AEROBIC AND ANAEROBIC Performed at [facility], [address], [place], [place] [address]    Culture PENDING    Report Status PENDING   Protime-INR     Status: Abnormal   Collection Time: 08/03/20  7:55 PM  Result Value Ref Range   Prothrombin Time 16.7 (H) 11.4 - 15.2 seconds   INR 1.4 (H) 0.8 - 1.2    Comment: (NOTE) INR goal varies based on device and disease states. Performed at [facility], [address], [place],  [address]   APTT  Status: Abnormal   Collection Time: 08/03/20  7:55 PM  Result Value Ref Range   aPTT 38 (H) 24 - 36 seconds    Comment:        IF BASELINE aPTT IS ELEVATED, SUGGEST PATIENT RISK ASSESSMENT BE USED TO DETERMINE APPROPRIATE ANTICOAGULANT THERAPY. Performed at [facility], [address] [place], [place] [address]   Lactic acid, plasma     Status: None   Collection Time: 08/03/20  7:56 PM  Result Value Ref Range   Lactic Acid, Venous 0.9 0.5 - 1.9 mmol/L    Comment: Performed at [facility], [address], [place], [place] [address]  Blood gas, venous     Status: Abnormal   Collection Time: 08/03/20 10:05 PM  Result Value Ref Range   FIO2 32.00    pH, Ven 7.402 7.250 - 7.430   pCO2, Ven 37.5 (L) 44.0 - 60.0 mmHg   pO2, Ven 106.0 (H) 32.0 - 45.0 mmHg   Bicarbonate 23.5 20.0 - 28.0 mmol/L   Acid-base deficit 1.2 0.0 -  2.0 mmol/L   O2 Saturation 98.0 %   Patient temperature 37.0     Comment: Performed at [facility], [address] [place], [place] [address]  Urinalysis, Routine w reflex microscopic Urine, Clean Catch     Status: None   Collection Time: 08/04/20 12:24 AM  Result Value Ref Range   Color, Urine YELLOW YELLOW   APPearance CLEAR CLEAR   Specific Gravity, Urine 1.009 1.005 - 1.030   pH 6.0 5.0 - 8.0   Glucose, UA NEGATIVE NEGATIVE mg/dL   Hgb urine dipstick NEGATIVE NEGATIVE   Bilirubin Urine NEGATIVE NEGATIVE   Ketones, ur NEGATIVE NEGATIVE mg/dL   Protein, ur NEGATIVE NEGATIVE mg/dL   Nitrite NEGATIVE NEGATIVE   Leukocytes,Ua NEGATIVE NEGATIVE    Comment: Performed at [facility], [address], [place], [place] [address]    Chemistries  Recent Labs  Lab 08/03/20 1917  NA 126*  K 3.9  CL 96*  CO2 24  GLUCOSE 105*  BUN 23*  CREATININE 0.56*  CALCIUM 7.7*  AST 27  ALT 17  ALKPHOS 144*  BILITOT 0.9   ------------------------------------------------------------------------------------------------------------------  ------------------------------------------------------------------------------------------------------------------ GFR: Estimated Creatinine Clearance: 89.7 mL/min (A) (by C-G formula based on SCr of 0.56 mg/dL (L)). Liver Function Tests: Recent Labs  Lab 08/03/20 1917  AST 27  ALT 17  ALKPHOS 144*  BILITOT 0.9  PROT 6.3*  ALBUMIN 2.6*   No results for input(s): LIPASE, AMYLASE in the last 168 hours. No results for input(s): AMMONIA in the last 168 hours. Coagulation Profile: Recent Labs  Lab 08/03/20 1955  INR 1.4*   Cardiac Enzymes: No results for input(s): CKTOTAL, CKMB, CKMBINDEX, TROPONINI in the last 168 hours. BNP (last 3 results) No results for input(s): PROBNP in the last 8760 hours. HbA1C: No results for input(s): HGBA1C in the last 72 hours. CBG: No results for input(s): GLUCAP in the last 168 hours. Lipid Profile: No results  for input(s): CHOL, HDL, LDLCALC, TRIG, CHOLHDL, LDLDIRECT in the last 72 hours. Thyroid Function Tests: No results for input(s): TSH, T4TOTAL, FREET4, T3FREE, THYROIDAB in the last 72 hours. Anemia Panel: No results for input(s): VITAMINB12, FOLATE, FERRITIN, TIBC, IRON, RETICCTPCT in the last 72 hours.  --------------------------------------------------------------------------------------------------------------- Urine analysis:    Component Value Date/Time   COLORURINE YELLOW 08/04/2020 0024   APPEARANCEUR CLEAR 08/04/2020 0024   LABSPEC 1.009 08/04/2020 0024   PHURINE 6.0 08/04/2020 0024   GLUCOSEU NEGATIVE 08/04/2020 0024  HGBUR NEGATIVE 08/04/2020 0024   BILIRUBINUR NEGATIVE 08/04/2020 0024   KETONESUR NEGATIVE 08/04/2020 0024   PROTEINUR NEGATIVE 08/04/2020 0024   UROBILINOGEN 0.2 10/21/2012 1737   NITRITE NEGATIVE 08/04/2020 0024   LEUKOCYTESUR NEGATIVE 08/04/2020 0024      Imaging Results:    DG Chest Port 1 View  Result Date: 08/03/2020 CLINICAL DATA:  Shortness of breath and low-grade fever with possible sepsis. EXAM: PORTABLE CHEST 1 VIEW COMPARISON:  June 29, 2020 FINDINGS: The lungs are hyperinflated. Diffuse, chronic appearing increased interstitial lung markings are noted. There is no evidence of acute infiltrate, pleural effusion or pneumothorax. The heart size and mediastinal contours are within normal limits. Chronic sixth and seventh right rib fractures are seen. IMPRESSION: COPD without acute or active cardiopulmonary disease. Electronically Signed   By: Virgina Norfolk M.D.   On: 08/03/2020 19:24      Assessment & Plan:    Principal Problem:   Dehydration Active Problems:   Moderate protein-calorie malnutrition (HCC)   Hyponatremia   Generalized weakness   1. Dehydration 1. BUN to creatinine ratio was 23: 0.56 2. 2 L bolus in ED 3. Given history of CHF will do further IV hydration until I see trend in a.m. 4. Likely secondary to poor p.o.  intake 5. Encourage nutrient dense food choices, add protein shake 6. Continue to monitor 2. Generalized weakness 1. PT eval and treat 2. Suspect pneumonia as etiology with patient initially complaining of cough, fever, weakness 3. Initial chest x-ray does not show pneumonia, but patient is dehydrated, recheck chest x-ray after hydration 4. Antibiotics continued in the setting of near leukopenia and near hypotension 5. Continue to monitor 3. Hyponatremia 1. Likely secondary to poor p.o. intake 2. Hypovolemic hyponatremia 3. 2 L NaCl given in ED 4. Trend in the a.m. 4. Moderate protein calorie malnutrition 1. Albumin 2.6 2. Continue protein shake 3. Likely secondary to poor p.o. intake 4. Also probably contributing to #2   DVT Prophylaxis-   Heparin- SCDs  AM Labs Ordered, also please review Full Orders  Family Communication: No family at bedside  Code Status: Full Admission status: Observation Likely to dispo in 24 hours Time spent in minutes : [place] DO

## 2020-08-05 LAB — URINE CULTURE: Culture: NO GROWTH

## 2020-08-07 ENCOUNTER — Institutional Professional Consult (permissible substitution): Payer: Medicaid Other | Admitting: Pulmonary Disease

## 2020-08-08 ENCOUNTER — Ambulatory Visit: Payer: Medicaid Other | Admitting: Gastroenterology

## 2020-08-08 LAB — CULTURE, BLOOD (SINGLE)
Culture: NO GROWTH
Special Requests: ADEQUATE

## 2020-08-15 ENCOUNTER — Ambulatory Visit: Payer: Medicaid Other | Admitting: Gastroenterology

## 2020-08-17 ENCOUNTER — Institutional Professional Consult (permissible substitution): Payer: Medicaid Other | Admitting: Pulmonary Disease

## 2020-08-22 ENCOUNTER — Inpatient Hospital Stay
Admission: EM | Admit: 2020-08-22 | Discharge: 2020-08-30 | DRG: 177 | Disposition: A | Discharge status: Home / Self Care | Payer: Medicaid Other | Attending: Internal Medicine | Admitting: Internal Medicine

## 2020-08-22 ENCOUNTER — Emergency Department: Payer: Medicaid Other

## 2020-08-22 ENCOUNTER — Encounter: Payer: Self-pay | Admitting: Emergency Medicine

## 2020-08-22 ENCOUNTER — Other Ambulatory Visit: Payer: Self-pay

## 2020-08-22 DIAGNOSIS — E2749 Other adrenocortical insufficiency: Secondary | ICD-10-CM | POA: Diagnosis present

## 2020-08-22 DIAGNOSIS — Z7952 Long term (current) use of systemic steroids: Secondary | ICD-10-CM

## 2020-08-22 DIAGNOSIS — Z20822 Contact with and (suspected) exposure to covid-19: Secondary | ICD-10-CM | POA: Diagnosis present

## 2020-08-22 DIAGNOSIS — J15212 Pneumonia due to Methicillin resistant Staphylococcus aureus: Principal | ICD-10-CM | POA: Diagnosis present

## 2020-08-22 DIAGNOSIS — M0500 Felty's syndrome, unspecified site: Secondary | ICD-10-CM | POA: Diagnosis present

## 2020-08-22 DIAGNOSIS — M059 Rheumatoid arthritis with rheumatoid factor, unspecified: Secondary | ICD-10-CM | POA: Diagnosis present

## 2020-08-22 DIAGNOSIS — Z888 Allergy status to other drugs, medicaments and biological substances status: Secondary | ICD-10-CM

## 2020-08-22 DIAGNOSIS — I38 Endocarditis, valve unspecified: Secondary | ICD-10-CM | POA: Diagnosis not present

## 2020-08-22 DIAGNOSIS — J9621 Acute and chronic respiratory failure with hypoxia: Secondary | ICD-10-CM

## 2020-08-22 DIAGNOSIS — F17210 Nicotine dependence, cigarettes, uncomplicated: Secondary | ICD-10-CM | POA: Diagnosis present

## 2020-08-22 DIAGNOSIS — J851 Abscess of lung with pneumonia: Secondary | ICD-10-CM | POA: Diagnosis present

## 2020-08-22 DIAGNOSIS — J189 Pneumonia, unspecified organism: Secondary | ICD-10-CM

## 2020-08-22 DIAGNOSIS — Z792 Long term (current) use of antibiotics: Secondary | ICD-10-CM | POA: Diagnosis not present

## 2020-08-22 DIAGNOSIS — J852 Abscess of lung without pneumonia: Secondary | ICD-10-CM | POA: Diagnosis not present

## 2020-08-22 DIAGNOSIS — R0902 Hypoxemia: Secondary | ICD-10-CM

## 2020-08-22 DIAGNOSIS — Z825 Family history of asthma and other chronic lower respiratory diseases: Secondary | ICD-10-CM | POA: Diagnosis not present

## 2020-08-22 DIAGNOSIS — J438 Other emphysema: Secondary | ICD-10-CM | POA: Diagnosis present

## 2020-08-22 DIAGNOSIS — J449 Chronic obstructive pulmonary disease, unspecified: Secondary | ICD-10-CM | POA: Diagnosis not present

## 2020-08-22 DIAGNOSIS — D709 Neutropenia, unspecified: Secondary | ICD-10-CM | POA: Diagnosis not present

## 2020-08-22 DIAGNOSIS — T68XXXA Hypothermia, initial encounter: Secondary | ICD-10-CM

## 2020-08-22 DIAGNOSIS — J984 Other disorders of lung: Secondary | ICD-10-CM | POA: Diagnosis not present

## 2020-08-22 DIAGNOSIS — D6959 Other secondary thrombocytopenia: Secondary | ICD-10-CM | POA: Diagnosis present

## 2020-08-22 DIAGNOSIS — R627 Adult failure to thrive: Secondary | ICD-10-CM | POA: Diagnosis present

## 2020-08-22 DIAGNOSIS — Z9981 Dependence on supplemental oxygen: Secondary | ICD-10-CM | POA: Diagnosis not present

## 2020-08-22 DIAGNOSIS — R531 Weakness: Secondary | ICD-10-CM

## 2020-08-22 DIAGNOSIS — Z6821 Body mass index (BMI) 21.0-21.9, adult: Secondary | ICD-10-CM

## 2020-08-22 DIAGNOSIS — D61818 Other pancytopenia: Secondary | ICD-10-CM | POA: Diagnosis present

## 2020-08-22 DIAGNOSIS — D849 Immunodeficiency, unspecified: Secondary | ICD-10-CM | POA: Diagnosis present

## 2020-08-22 DIAGNOSIS — Z79899 Other long term (current) drug therapy: Secondary | ICD-10-CM

## 2020-08-22 DIAGNOSIS — Z8249 Family history of ischemic heart disease and other diseases of the circulatory system: Secondary | ICD-10-CM | POA: Diagnosis not present

## 2020-08-22 DIAGNOSIS — J432 Centrilobular emphysema: Secondary | ICD-10-CM | POA: Diagnosis present

## 2020-08-22 DIAGNOSIS — E440 Moderate protein-calorie malnutrition: Secondary | ICD-10-CM | POA: Diagnosis present

## 2020-08-22 DIAGNOSIS — E876 Hypokalemia: Secondary | ICD-10-CM | POA: Diagnosis present

## 2020-08-22 DIAGNOSIS — I5032 Chronic diastolic (congestive) heart failure: Secondary | ICD-10-CM | POA: Diagnosis present

## 2020-08-22 DIAGNOSIS — F0390 Unspecified dementia without behavioral disturbance: Secondary | ICD-10-CM | POA: Diagnosis present

## 2020-08-22 DIAGNOSIS — E871 Hypo-osmolality and hyponatremia: Secondary | ICD-10-CM | POA: Diagnosis present

## 2020-08-22 DIAGNOSIS — B9562 Methicillin resistant Staphylococcus aureus infection as the cause of diseases classified elsewhere: Secondary | ICD-10-CM | POA: Diagnosis not present

## 2020-08-22 DIAGNOSIS — Z881 Allergy status to other antibiotic agents status: Secondary | ICD-10-CM | POA: Diagnosis not present

## 2020-08-22 DIAGNOSIS — M069 Rheumatoid arthritis, unspecified: Secondary | ICD-10-CM | POA: Diagnosis not present

## 2020-08-22 LAB — CBC
HCT: 43.2 % (ref 39.0–52.0)
Hemoglobin: 13.9 g/dL (ref 13.0–17.0)
MCH: 27.2 pg (ref 26.0–34.0)
MCHC: 32.2 g/dL (ref 30.0–36.0)
MCV: 84.5 fL (ref 80.0–100.0)
Platelets: 127 10*3/uL — ABNORMAL LOW (ref 150–400)
RBC: 5.11 MIL/uL (ref 4.22–5.81)
RDW: 16.9 % — ABNORMAL HIGH (ref 11.5–15.5)
WBC: 3.1 10*3/uL — ABNORMAL LOW (ref 4.0–10.5)
nRBC: 1 % — ABNORMAL HIGH (ref 0.0–0.2)

## 2020-08-22 LAB — COMPREHENSIVE METABOLIC PANEL
ALT: 18 U/L (ref 0–44)
AST: 37 U/L (ref 15–41)
Albumin: 2 g/dL — ABNORMAL LOW (ref 3.5–5.0)
Alkaline Phosphatase: 116 U/L (ref 38–126)
Anion gap: 9 (ref 5–15)
BUN: 27 mg/dL — ABNORMAL HIGH (ref 6–20)
CO2: 25 mmol/L (ref 22–32)
Calcium: 8 mg/dL — ABNORMAL LOW (ref 8.9–10.3)
Chloride: 98 mmol/L (ref 98–111)
Creatinine, Ser: 0.79 mg/dL (ref 0.61–1.24)
GFR, Estimated: >60 mL/min (ref >60)
Glucose, Bld: 169 mg/dL — ABNORMAL HIGH (ref 70–99)
Potassium: 4.1 mmol/L (ref 3.5–5.1)
Sodium: 132 mmol/L — ABNORMAL LOW (ref 135–145)
Total Bilirubin: 1.1 mg/dL (ref 0.3–1.2)
Total Protein: 6.6 g/dL (ref 6.5–8.1)

## 2020-08-22 LAB — URINALYSIS, COMPLETE (UACMP) WITH MICROSCOPIC
Bilirubin Urine: NEGATIVE
Glucose, UA: NEGATIVE mg/dL
Hgb urine dipstick: NEGATIVE
Ketones, ur: NEGATIVE mg/dL
Leukocytes,Ua: NEGATIVE
Nitrite: NEGATIVE
Protein, ur: NEGATIVE mg/dL
Specific Gravity, Urine: 1.024 (ref 1.005–1.030)
Squamous Epithelial / HPF: NONE SEEN (ref 0–5)
pH: 5 (ref 5.0–8.0)

## 2020-08-22 LAB — TROPONIN I (HIGH SENSITIVITY)
Troponin I (High Sensitivity): 19 ng/L — ABNORMAL HIGH (ref <18)
Troponin I (High Sensitivity): 14 ng/L (ref <18)

## 2020-08-22 LAB — RESP PANEL BY RT-PCR (FLU A&B, COVID) ARPGX2
Influenza A by PCR: NEGATIVE
Influenza B by PCR: NEGATIVE
SARS Coronavirus 2 by RT PCR: NEGATIVE

## 2020-08-22 IMAGING — DX DG CHEST 1V PORT
1 series · 1 of 1 positions shown · non-contrast
Comparison: [DATE]

CLINICAL DATA: Shortness of breath

EXAM:
PORTABLE CHEST 1 VIEW

[chest ap]
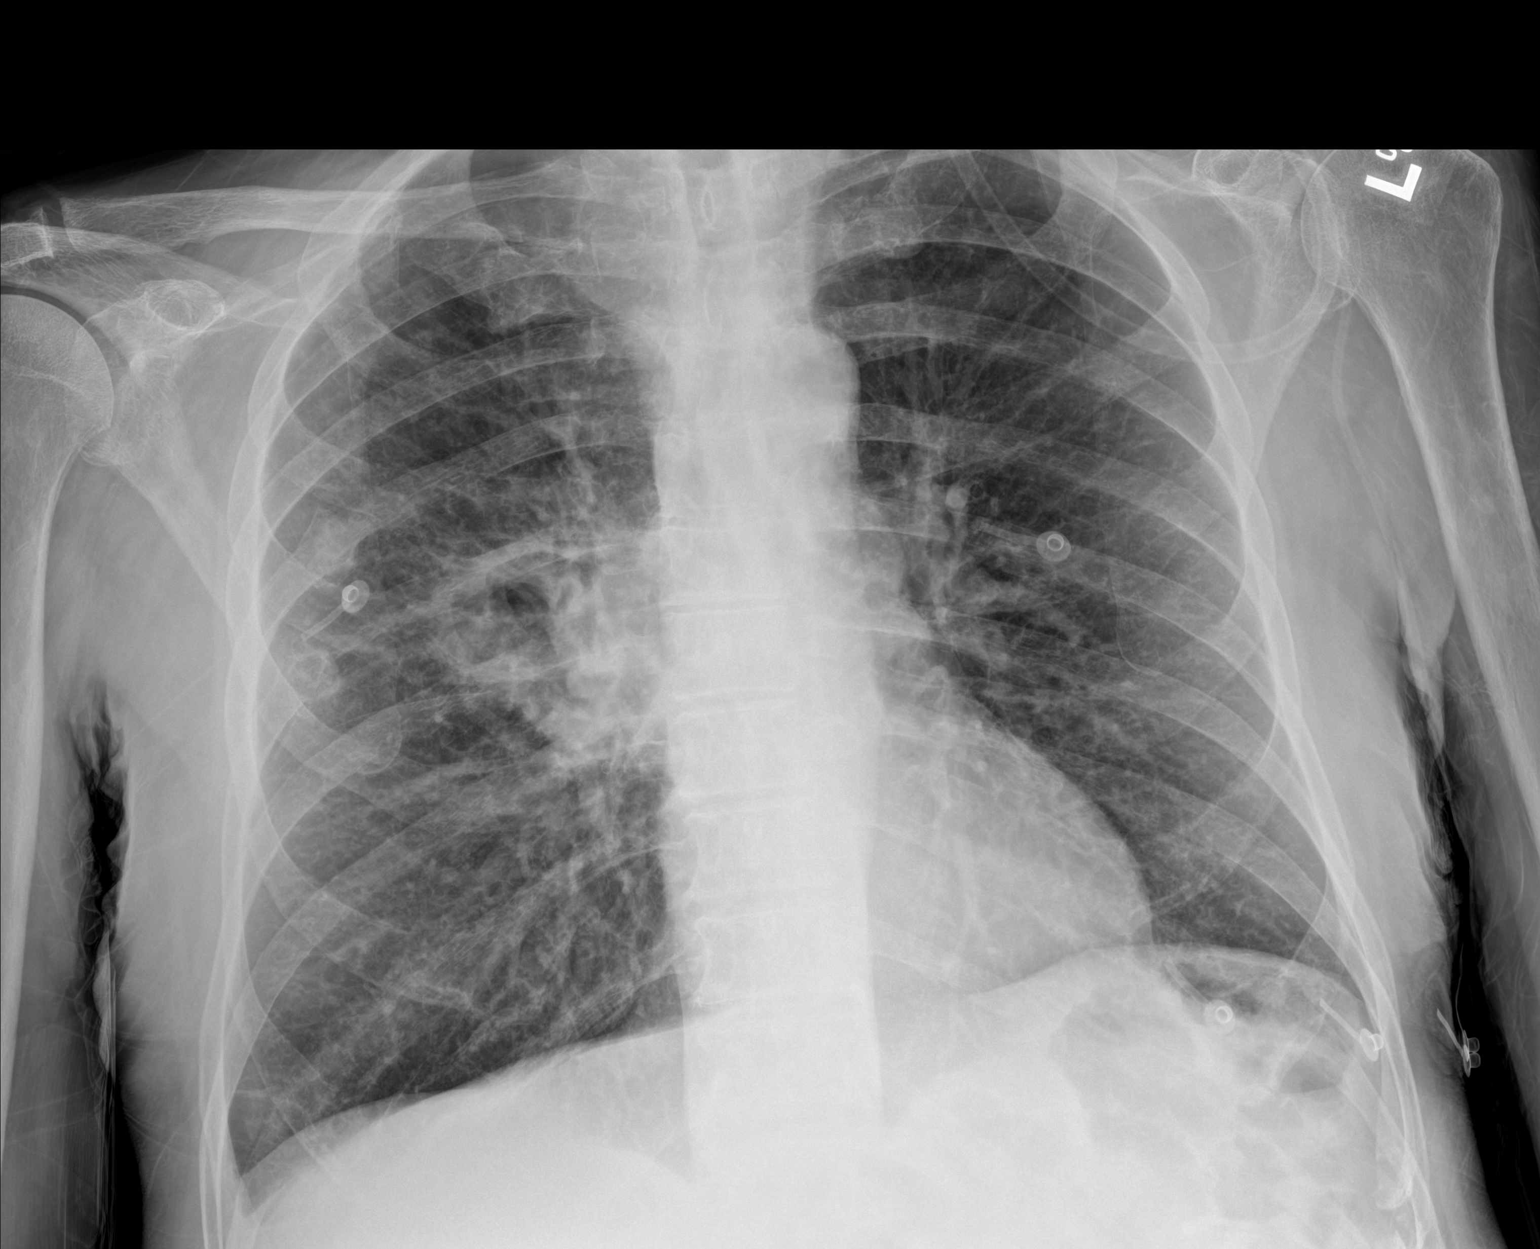

[1 of 1 positions shown; findings below may reference images not displayed]

FINDINGS: There is apparent scarring in the right perihilar region. There is
suggestion of cavitation in this area in the right perihilar region.
Slight scarring lateral right base. The lungs elsewhere are clear.
Heart size and pulmonary vascularity are normal. No appreciable
adenopathy. No bone lesions.
IMPRESSION: Scarring right mid lung. Suggestion of developing cavitation in this
area which could be due to overlapping of areas of scarring and
normal structures. Given concern for cavitation in this area,
noncontrast chest CT advised to further evaluate. Slight scarring
lateral right base. Lungs elsewhere are clear. Heart size normal. No
adenopathy evident by radiography.

## 2020-08-22 IMAGING — CT CT CHEST W/O CM
2 of 3 series · 15 of 36 positions shown, 18 images · non-contrast
Comparison: Radiographs [DATE] and [DATE].  CT [DATE].

CLINICAL DATA: Abnormal x-ray. Interstitial infiltrates with
possible right perihilar cavitation. Cough and shortness of breath.

EXAM:
CT CHEST WITHOUT CONTRAST
TECHNIQUE: Multidetector CT imaging of the chest was performed following the
standard protocol without IV contrast.

[Series 2: thorax · axial · 0.70mm/px · z∈[-1160,-844]mm · 12 of 186 slices shown, 15 images]
[im 14/186  mediastinal]
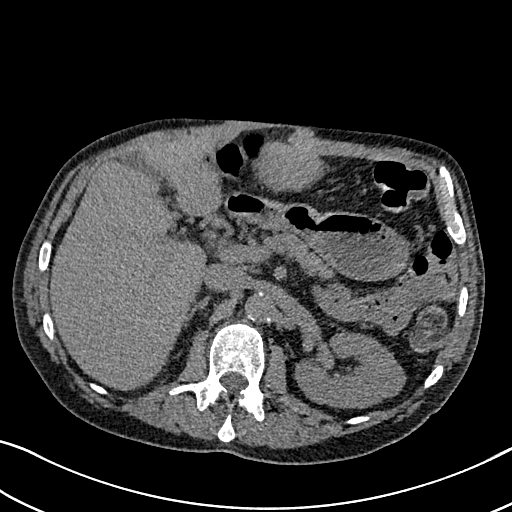
[im 14/186  lung]
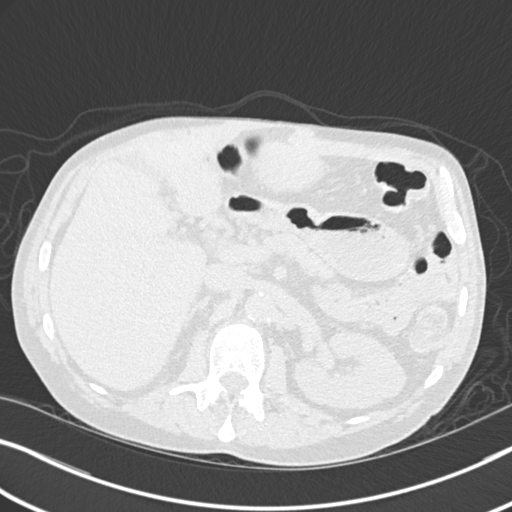
[im 28/186  lung]
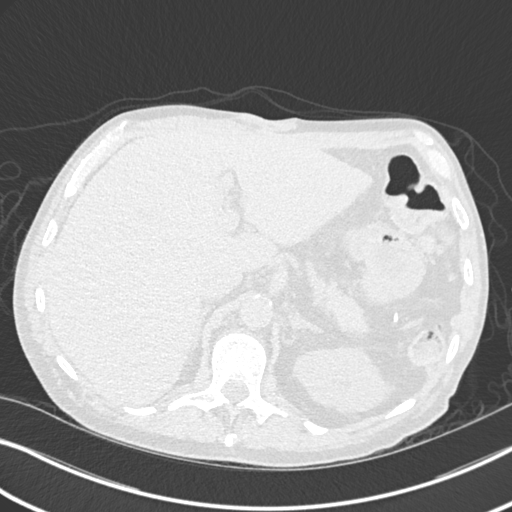
[im 42/186  lung]
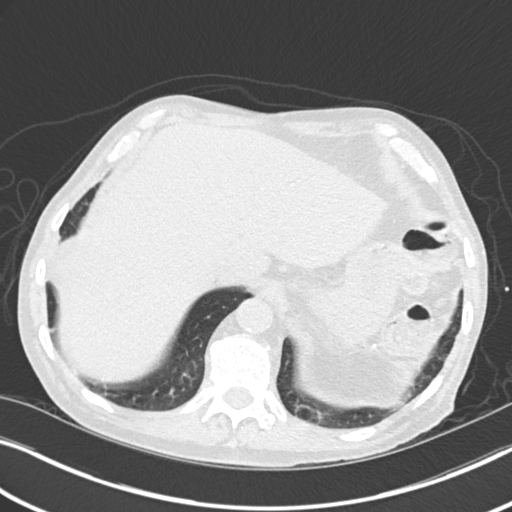
[im 55/186  lung]
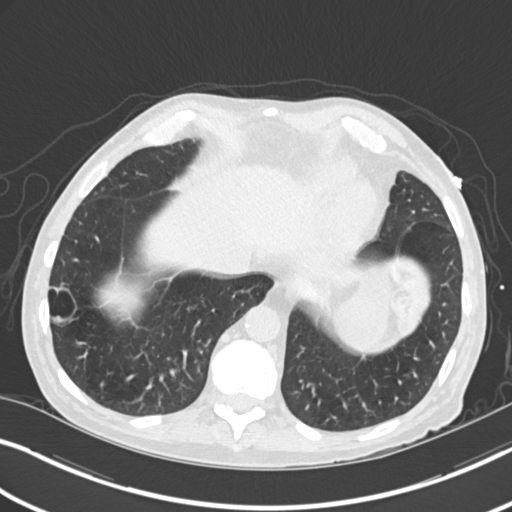
[im 69/186  mediastinal]
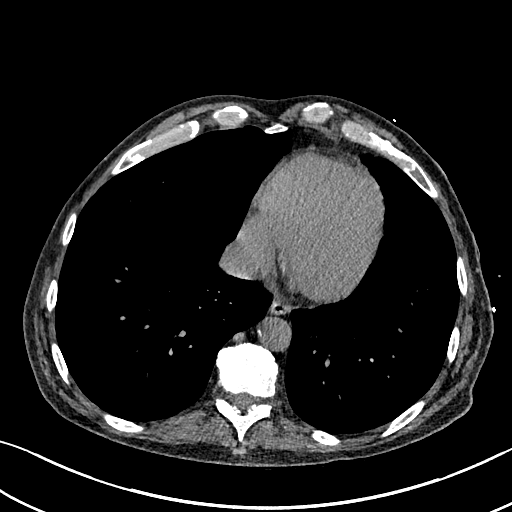
[im 69/186  lung]
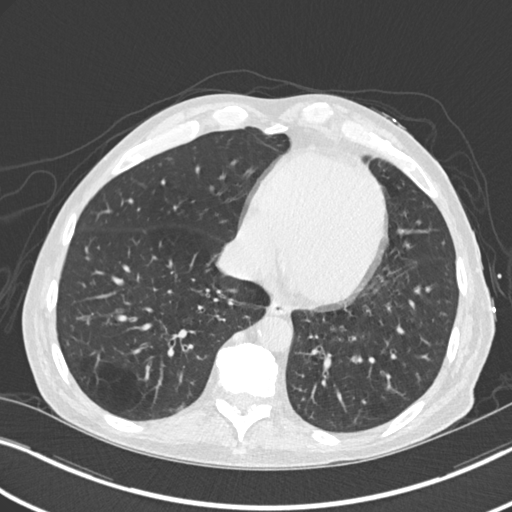
[im 83/186  lung]
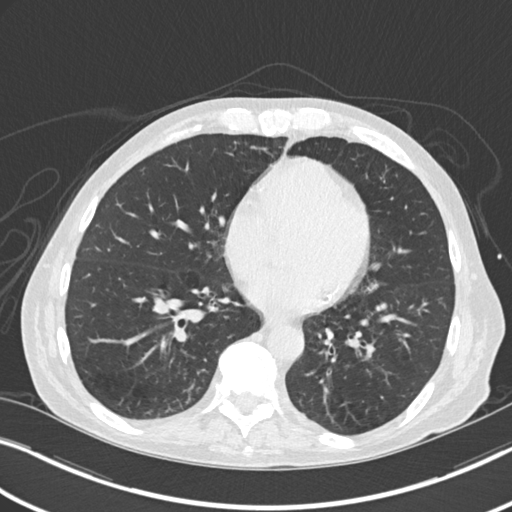
[im 103/186  lung]
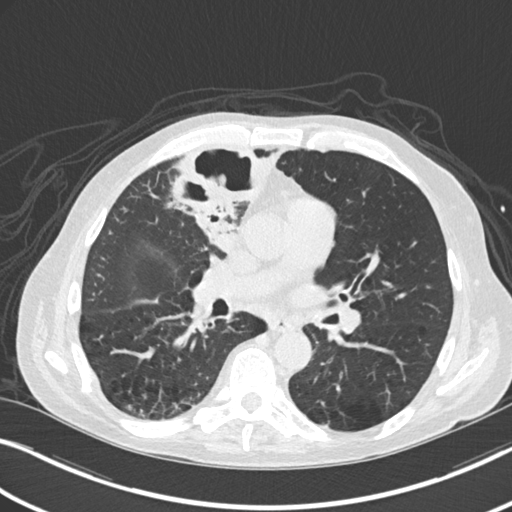
[im 117/186  lung]
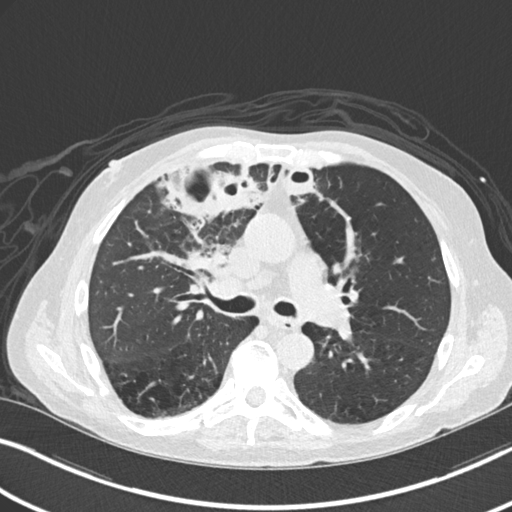
[im 131/186  mediastinal]
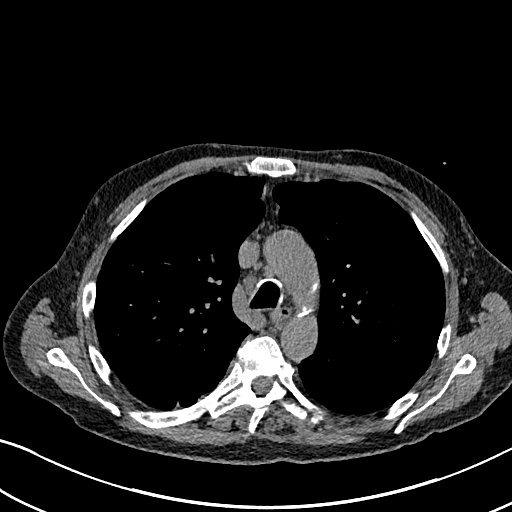
[im 131/186  lung]
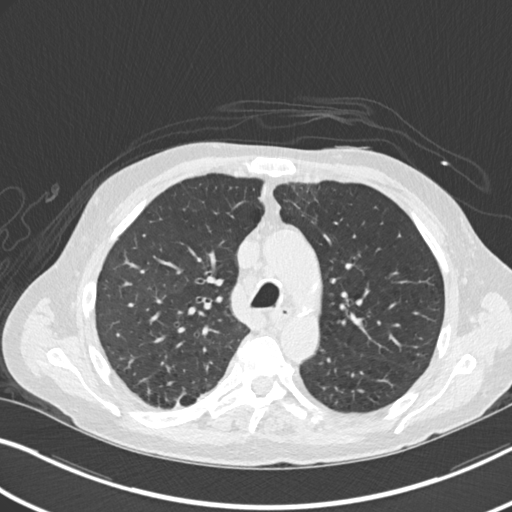
[im 144/186  lung]
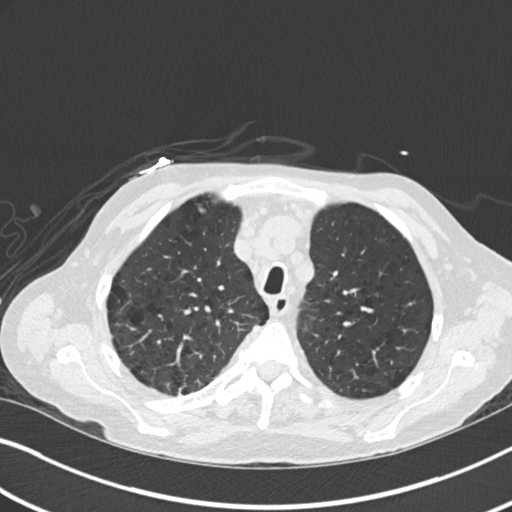
[im 158/186  lung]
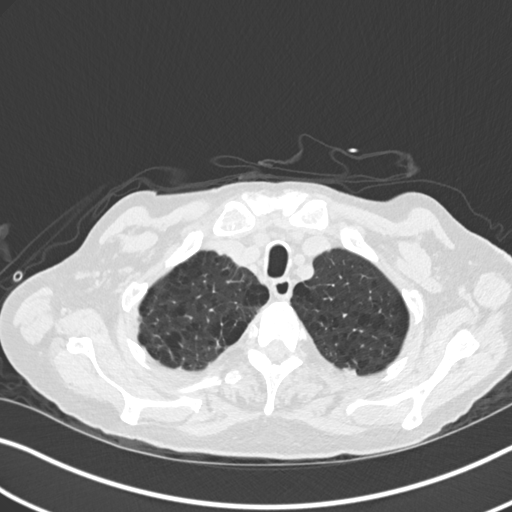
[im 172/186  lung]
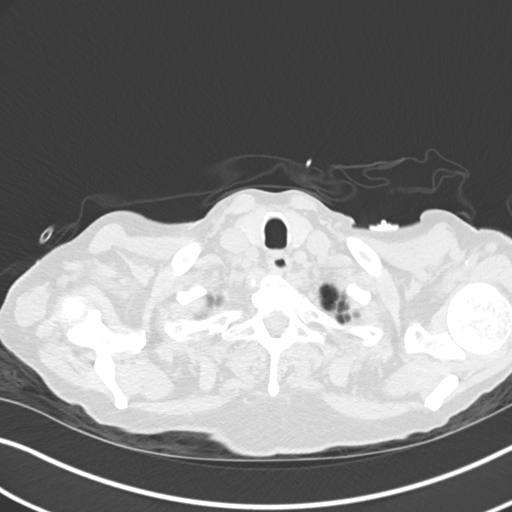

[Series 5: coronal · coronal · 0.70mm/px · 3 of 133 slices shown]
[im 27/133  lung]
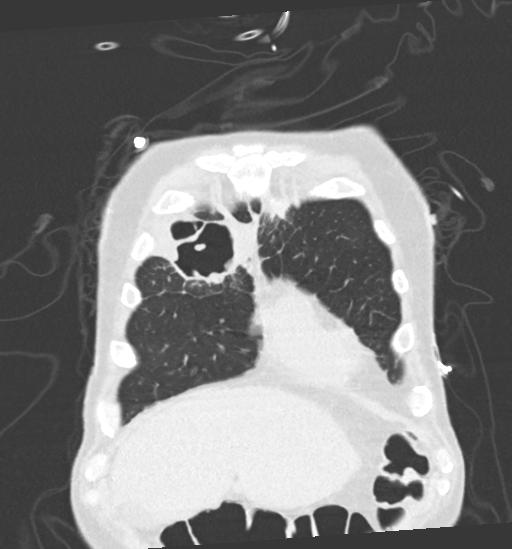
[im 53/133  lung]
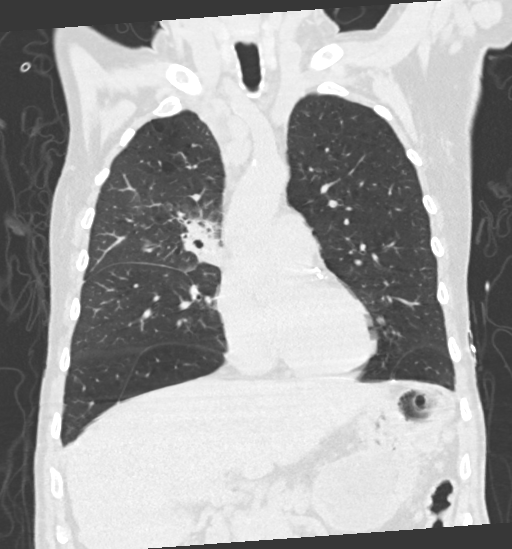
[im 80/133  lung]
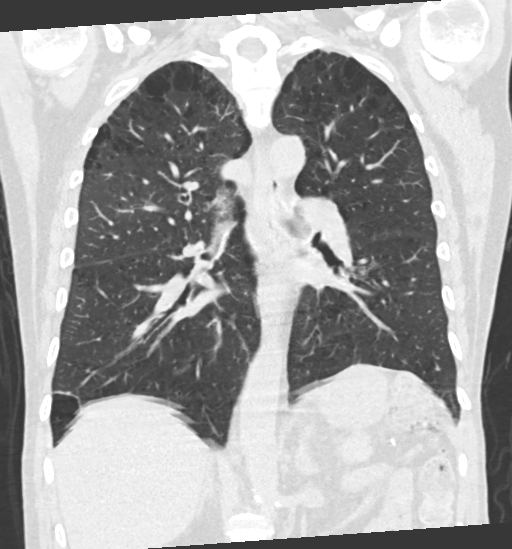

[15 of 36 positions shown; findings below may reference images not displayed]

FINDINGS: Cardiovascular: Extensive three-vessel coronary artery
atherosclerosis with lesser involvement of the aorta and great
vessels. The heart size is normal. There is no pericardial effusion.

Mediastinum/Nodes: Multiple small mediastinal and hilar lymph nodes
have enlarged compared with the chest CT of 2 months ago, likely
reactive. No axillary lymphadenopathy. The thyroid gland, trachea
and esophagus demonstrate no significant findings.

Lungs/Pleura: There is no pleural effusion or pneumothorax. Moderate
centrilobular and paraseptal emphysema. As seen on the earlier
radiographs, there is a new cavitary mass inferomedially in the
right upper lobe, measuring up to 7.8 x 5.5 cm on image 79/4. This
has irregular thick walls and associated air-fluid levels. There was
no abnormality in this area on the relatively recent prior chest CT,
and this is most consistent with cavitating pneumonia. There is an
additional cavitary lesion with an air-fluid level medially in the
left upper lobe, measuring up to 2.4 cm on image 65/4. There is mild
patchy airspace disease surrounding these cavitary infiltrates.

Upper abdomen: Small calcified gallstones. No evidence of biliary
dilatation or adrenal mass. Postsurgical changes in the anterior
abdominal wall consistent with previous hernia repair.

Musculoskeletal/Chest wall: There is no chest wall mass or
suspicious osseous finding. There are old fractures of the right 5th
and 6th ribs posterolaterally which appear unchanged.
IMPRESSION: 1. CT confirms the presence of a cavitary infiltrate in the right
upper lobe and shows a smaller area of cavitation anteriorly in the
left upper lobe. No abnormalities were present in these areas on CT
performed 2 months ago to suggest cavitary neoplasm, and these
findings are consistent with cavitating/necrotizing pneumonia.
2. Mildly enlarged mediastinal and hilar lymph nodes, likely
reactive.
3. No associated pleural disease.
4. Cholelithiasis.
5. Underlying moderate Emphysema ([ZW]-[ZW]).
6. Coronary and Aortic Atherosclerosis ([ZW]-[ZW]).

## 2020-08-22 MED ORDER — SODIUM CHLORIDE 0.9 % IV SOLN
INTRAVENOUS | Status: DC
  Administered 2020-08-22: 750 mL via INTRAVENOUS

## 2020-08-22 MED ORDER — PANTOPRAZOLE SODIUM 40 MG PO TBEC
40.0000 mg | DELAYED_RELEASE_TABLET | Freq: Every day | ORAL | Status: DC
  Administered 2020-08-22 – 2020-08-30 (×9): 40 mg via ORAL
  Filled 2020-08-22 (×9): qty 1

## 2020-08-22 MED ORDER — ENOXAPARIN SODIUM 40 MG/0.4ML IJ SOSY
40.0000 mg | PREFILLED_SYRINGE | INTRAMUSCULAR | Status: DC
  Administered 2020-08-22 – 2020-08-27 (×6): 40 mg via SUBCUTANEOUS
  Filled 2020-08-22 (×6): qty 0.4

## 2020-08-22 MED ORDER — PIPERACILLIN-TAZOBACTAM 3.375 G IVPB
3.3750 g | Freq: Three times a day (TID) | INTRAVENOUS | Status: DC
  Administered 2020-08-22 – 2020-08-27 (×14): 3.375 g via INTRAVENOUS
  Filled 2020-08-22 (×14): qty 50

## 2020-08-22 MED ORDER — SODIUM CHLORIDE 0.9 % IV BOLUS
250.0000 mL | Freq: Once | INTRAVENOUS | Status: AC
  Administered 2020-08-22: 250 mL via INTRAVENOUS

## 2020-08-22 MED ORDER — PREDNISONE 20 MG PO TABS
10.0000 mg | ORAL_TABLET | Freq: Every day | ORAL | Status: DC
  Administered 2020-08-23 – 2020-08-24 (×2): 10 mg via ORAL
  Filled 2020-08-22 (×2): qty 1

## 2020-08-22 MED ORDER — IPRATROPIUM-ALBUTEROL 0.5-2.5 (3) MG/3ML IN SOLN
3.0000 mL | Freq: Once | RESPIRATORY_TRACT | Status: AC
  Administered 2020-08-22: 3 mL via RESPIRATORY_TRACT

## 2020-08-22 MED ORDER — SODIUM CHLORIDE 0.9 % IV BOLUS
1000.0000 mL | Freq: Once | INTRAVENOUS | Status: AC
  Administered 2020-08-22: 1000 mL via INTRAVENOUS

## 2020-08-22 MED ORDER — SODIUM CHLORIDE 0.9 % IV SOLN
1.0000 g | Freq: Once | INTRAVENOUS | Status: AC
  Administered 2020-08-22: 1 g via INTRAVENOUS
  Filled 2020-08-22: qty 10

## 2020-08-22 MED ORDER — ALBUTEROL SULFATE (2.5 MG/3ML) 0.083% IN NEBU
2.5000 mg | INHALATION_SOLUTION | Freq: Four times a day (QID) | RESPIRATORY_TRACT | Status: DC | PRN
  Administered 2020-08-22: 2.5 mg via RESPIRATORY_TRACT
  Filled 2020-08-22: qty 3

## 2020-08-22 MED ORDER — SODIUM CHLORIDE 0.9 % IV SOLN
500.0000 mg | Freq: Once | INTRAVENOUS | Status: AC
  Administered 2020-08-22: 500 mg via INTRAVENOUS
  Filled 2020-08-22: qty 500

## 2020-08-22 MED ORDER — MOMETASONE FURO-FORMOTEROL FUM 100-5 MCG/ACT IN AERO
2.0000 | INHALATION_SPRAY | Freq: Two times a day (BID) | RESPIRATORY_TRACT | Status: DC
  Administered 2020-08-23 – 2020-08-30 (×14): 2 via RESPIRATORY_TRACT
  Filled 2020-08-22 (×4): qty 8.8

## 2020-08-22 MED ORDER — IPRATROPIUM-ALBUTEROL 0.5-2.5 (3) MG/3ML IN SOLN
3.0000 mL | Freq: Once | RESPIRATORY_TRACT | Status: AC
  Administered 2020-08-22: 3 mL via RESPIRATORY_TRACT
  Filled 2020-08-22: qty 6

## 2020-08-22 MED ORDER — ENSURE ENLIVE PO LIQD
237.0000 mL | Freq: Two times a day (BID) | ORAL | Status: DC
  Administered 2020-08-23 – 2020-08-30 (×14): 237 mL via ORAL

## 2020-08-22 MED ORDER — SODIUM CHLORIDE 0.9 % IV SOLN: 100.0000 mg/kg | Freq: Three times a day (TID) | INTRAVENOUS | Status: DC

## 2020-08-22 MED ORDER — HYDROCOD POLST-CPM POLST ER 10-8 MG/5ML PO SUER
5.0000 mL | Freq: Once | ORAL | Status: AC
Start: 2020-08-22 — End: 2020-08-22
  Administered 2020-08-22: 5 mL via ORAL
  Filled 2020-08-22: qty 5

## 2020-08-22 NOTE — H&P (Signed)
Marcus [name] is an 60 y.o. male.   Chief Complaint: Short of breath and cough. 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 Patient is a very poor historian, at baseline, he is independent of all ADLs.  He currently lives with his 42 year old mother at home.  He remembered having fever about a month ago.  He also has worsening short of breath and cough.  He coughed up large amount of white and yellow mucus.  He did not have any chest pain.  He does not have night sweats.  But he lost significant amount of body weight.  He also has significant weakness. Upon arriving the emergency room, he was afebrile, required 4 L oxygen, sodium 132.  Glucose 169.  White cell 3.1, hemoglobin 13.9, platelets 127. CT chest without contrast showed infiltrates with cavitary lesion on the right upper lobe.  She will be placed on Zosyn for possible lung abscess.  Past Medical History:  Diagnosis Date  . COPD (chronic obstructive pulmonary disease) ([place])   . 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 Hernia, epigastric   . Pancytopenia ([place])   . Seropositive rheumatoid arthritis ([place])   . Tobacco use disorder     Past Surgical History:  Procedure Laterality Date  . INCISIONAL HERNIA REPAIR  01/17/2020   Procedure: HERNIA REPAIR INCISIONAL AND Silvestre Gunner;  Surgeon: Kinsinger, Arta Bruce, MD;  Location: [facility];  Service: General;;  . LAPAROTOMY N/A 01/17/2020   Procedure: EXPLORATORY LAPAROTOMY;  Surgeon: Kieth Brightly Arta Bruce, MD;  Location: [facility];  Service: General;  Laterality: N/A;    Family History  Problem Relation Age of Onset  . CAD Brother   . COPD Brother    Social History:  reports that he has been smoking cigarettes. He has been smoking about 1.00 pack per day. He has never used smokeless tobacco. He reports  current drug use. Drug: Marijuana. He reports that he does not drink alcohol.  Allergies:  Allergies  Allergen Reactions  . Levaquin [Levofloxacin In D5w] Other (See Comments)    Chest pain  . Methotrexate Other (See Comments)    Chest pain    (Not in a hospital admission)   Results for orders placed or performed during the hospital encounter of 08/22/20 (from the past 48 hour(s))  CBC     Status: Abnormal   Collection Time: 08/22/20 12:29 PM  Result Value Ref Range   WBC 3.1 (L) 4.0 - 10.5 K/uL   RBC 5.11 4.22 - 5.81 MIL/uL   Hemoglobin 13.9 13.0 - 17.0 g/dL   HCT 43.2 39.0 - 52.0 %   MCV 84.5 80.0 - 100.0 fL   MCH 27.2 26.0 - 34.0 pg   MCHC 32.2 30.0 - 36.0 g/dL   RDW 16.9 (H) 11.5 - 15.5 %   Platelets 127 (L) 150 - 400 K/uL   nRBC 1.0 (H) 0.0 - 0.2 %    Comment: Performed at [facility], [address] [place], [place] [address]  Comprehensive metabolic panel     Status: Abnormal   Collection Time: 08/22/20 12:29 PM  Result Value Ref Range   Sodium 132 (L) 135 - 145 mmol/L   Potassium 4.1 3.5 - 5.1 mmol/L   Chloride 98 98 - 111 mmol/L   CO2 25 22 - 32  mmol/L   Glucose, Bld 169 (H) 70 - 99 mg/dL    Comment: Glucose reference range applies only to samples taken after fasting for at least 8 hours.   BUN 27 (H) 6 - 20 mg/dL   Creatinine, Ser 0.79 0.61 - 1.24 mg/dL   Calcium 8.0 (L) 8.9 - 10.3 mg/dL   Total Protein 6.6 6.5 - 8.1 g/dL   Albumin 2.0 (L) 3.5 - 5.0 g/dL   AST 37 15 - 41 U/L   ALT 18 0 - 44 U/L   Alkaline Phosphatase 116 38 - 126 U/L   Total Bilirubin 1.1 0.3 - 1.2 mg/dL   GFR, Estimated >60 >60 mL/min    Comment: (NOTE) Calculated using the CKD-EPI Creatinine Equation (2021)    Anion gap 9 5 - 15    Comment: Performed at [facility], [place], [place] [address]  Troponin I (High Sensitivity)     Status: Abnormal   Collection Time: 08/22/20 12:29 PM  Result Value Ref Range   Troponin I (High Sensitivity) 19 (H)  <18 ng/L    Comment: (NOTE) Elevated high sensitivity troponin I (hsTnI) values and significant  changes across serial measurements may suggest ACS but many other  chronic and acute conditions are known to elevate hsTnI results.  Refer to the "Links" section for chest pain algorithms and additional  guidance. Performed at [facility], [place], [place] [address]   Resp Panel by RT-PCR (Flu A&B, Covid) Nasopharyngeal Swab     Status: None   Collection Time: 08/22/20 12:45 PM   Specimen: Nasopharyngeal Swab; Nasopharyngeal(NP) swabs in vial transport medium  Result Value Ref Range   SARS Coronavirus 2 by RT PCR NEGATIVE NEGATIVE    Comment: (NOTE) SARS-CoV-2 target nucleic acids are NOT DETECTED.  The SARS-CoV-2 RNA is generally detectable in upper respiratory specimens during the acute phase of infection. The lowest concentration of SARS-CoV-2 viral copies this assay can detect is 138 copies/mL. A negative result does not preclude SARS-Cov-2 infection and should not be used as the sole basis for treatment or other patient management decisions. A negative result may occur with  improper specimen collection/handling, submission of specimen other than nasopharyngeal swab, presence of viral mutation(s) within the areas targeted by this assay, and inadequate number of viral copies(<138 copies/mL). A negative result must be combined with clinical observations, patient history, and epidemiological information. The expected result is Negative.  Fact Sheet for Patients:  EntrepreneurPulse.com.au  Fact Sheet for Healthcare Providers:  IncredibleEmployment.be  This test is no t yet approved or cleared by the [place] FDA and  has been authorized for detection and/or diagnosis of SARS-CoV-2 by FDA under an Emergency Use Authorization (EUA). This EUA will remain  in effect (meaning this test can be used) for the duration of  the COVID-19 declaration under Section 564(b)(1) of the Act, 21 U.S.C.section 360bbb-3(b)(1), unless the authorization is terminated  or revoked sooner.       Influenza A by PCR NEGATIVE NEGATIVE   Influenza B by PCR NEGATIVE NEGATIVE    Comment: (NOTE) The Xpert Xpress SARS-CoV-2/FLU/RSV plus assay is intended as an aid in the diagnosis of influenza from Nasopharyngeal swab specimens and should not be used as a sole basis for treatment. Nasal washings and aspirates are unacceptable for Xpert Xpress SARS-CoV-2/FLU/RSV testing.  Fact Sheet for Patients: EntrepreneurPulse.com.au  Fact Sheet for Healthcare Providers: IncredibleEmployment.be  This test is not yet approved or cleared by the [place]  States FDA and has been authorized for detection and/or diagnosis of SARS-CoV-2 by FDA under an Emergency Use Authorization (EUA). This EUA will remain in effect (meaning this test can be used) for the duration of the COVID-19 declaration under Section 564(b)(1) of the Act, 21 U.S.C. section 360bbb-3(b)(1), unless the authorization is terminated or revoked.  Performed at [facility], [address] [place], [place] [address]   Urinalysis, Complete w Microscopic Urine, Clean Catch     Status: Abnormal   Collection Time: 08/22/20 12:53 PM  Result Value Ref Range   Color, Urine AMBER (A) YELLOW    Comment: BIOCHEMICALS MAY BE AFFECTED BY COLOR   APPearance HAZY (A) CLEAR   Specific Gravity, Urine 1.024 1.005 - 1.030   pH 5.0 5.0 - 8.0   Glucose, UA NEGATIVE NEGATIVE mg/dL   Hgb urine dipstick NEGATIVE NEGATIVE   Bilirubin Urine NEGATIVE NEGATIVE   Ketones, ur NEGATIVE NEGATIVE mg/dL   Protein, ur NEGATIVE NEGATIVE mg/dL   Nitrite NEGATIVE NEGATIVE   Leukocytes,Ua NEGATIVE NEGATIVE   RBC / HPF 0-5 0 - 5 RBC/hpf   WBC, UA 0-5 0 - 5 WBC/hpf   Bacteria, UA RARE (A) NONE SEEN   Squamous Epithelial / LPF NONE SEEN 0 - 5   Mucus PRESENT      Comment: Performed at [facility], [place], [place] [address]  Troponin I (High Sensitivity)     Status: None   Collection Time: 08/22/20  3:17 PM  Result Value Ref Range   Troponin I (High Sensitivity) 14 <18 ng/L    Comment: (NOTE) Elevated high sensitivity troponin I (hsTnI) values and significant  changes across serial measurements may suggest ACS but many other  chronic and acute conditions are known to elevate hsTnI results.  Refer to the "Links" section for chest pain algorithms and additional  guidance. Performed at [facility], [address], [place], [place] [address]    CT Chest Wo Contrast  Result Date: 08/22/2020 CLINICAL DATA:  Abnormal x-ray. Interstitial infiltrates with possible right perihilar cavitation. Cough and shortness of breath. EXAM: CT CHEST WITHOUT CONTRAST TECHNIQUE: Multidetector CT imaging of the chest was performed following the standard protocol without IV contrast. COMPARISON:  Radiographs 08/22/2020 and 08/04/2020.  CT 06/10/2020. FINDINGS: Cardiovascular: Extensive three-vessel coronary artery atherosclerosis with lesser involvement of the aorta and great vessels. The heart size is normal. There is no pericardial effusion. Mediastinum/Nodes: Multiple small mediastinal and hilar lymph nodes have enlarged compared with the chest CT of 2 months ago, likely reactive. No axillary lymphadenopathy. The thyroid gland, trachea and esophagus demonstrate no significant findings. Lungs/Pleura: There is no pleural effusion or pneumothorax. Moderate centrilobular and paraseptal emphysema. As seen on the earlier radiographs, there is a new cavitary mass inferomedially in the right upper lobe, measuring up to 7.8 x 5.5 cm on image 79/4. This has irregular thick walls and associated air-fluid levels. There was no abnormality in this area on the relatively recent prior chest CT, and this is most consistent with cavitating pneumonia. There is  an additional cavitary lesion with an air-fluid level medially in the left upper lobe, measuring up to 2.4 cm on image 65/4. There is mild patchy airspace disease surrounding these cavitary infiltrates. Upper abdomen: Small calcified gallstones. No evidence of biliary dilatation or adrenal mass. Postsurgical changes in the anterior abdominal wall consistent with previous hernia repair. Musculoskeletal/Chest wall: There is no chest wall mass or suspicious osseous finding. There are old fractures  of the right 5th and 6th ribs posterolaterally which appear unchanged. IMPRESSION: 1. CT confirms the presence of a cavitary infiltrate in the right upper lobe and shows a smaller area of cavitation anteriorly in the left upper lobe. No abnormalities were present in these areas on CT performed 2 months ago to suggest cavitary neoplasm, and these findings are consistent with cavitating/necrotizing pneumonia. 2. Mildly enlarged mediastinal and hilar lymph nodes, likely reactive. 3. No associated pleural disease. 4. Cholelithiasis. 5. Underlying moderate Emphysema (ICD10-J43.9). 6. Coronary and Aortic Atherosclerosis (ICD10-I70.0). Electronically Signed   By: Richardean Sale M.D.   On: 08/22/2020 14:26   DG Chest Portable 1 View  Result Date: 08/22/2020 CLINICAL DATA:  Shortness of breath EXAM: PORTABLE CHEST 1 VIEW COMPARISON:  August 04, 2020 FINDINGS: There is apparent scarring in the right perihilar region. There is suggestion of cavitation in this area in the right perihilar region. Slight scarring lateral right base. The lungs elsewhere are clear. Heart size and pulmonary vascularity are normal. No appreciable adenopathy. No bone lesions. IMPRESSION: Scarring right mid lung. Suggestion of developing cavitation in this area which could be due to overlapping of areas of scarring and normal structures. Given concern for cavitation in this area, noncontrast chest CT advised to further evaluate. Slight scarring lateral right  base. Lungs elsewhere are clear. Heart size normal. No adenopathy evident by radiography. Electronically Signed   By: Lowella Grip III M.D.   On: 08/22/2020 13:24    Review of Systems  Constitutional: Positive for activity change, fatigue, fever and unexpected weight change. Negative for diaphoresis.  HENT: Negative for nosebleeds, postnasal drip and rhinorrhea.   Eyes: Negative for pain and itching.  Respiratory: Positive for cough and shortness of breath. Negative for choking and wheezing.   Cardiovascular: Negative for chest pain, palpitations and leg swelling.  Gastrointestinal: Negative for abdominal pain, constipation, diarrhea, nausea and vomiting.  Genitourinary: Negative for difficulty urinating, dysuria and hematuria.  Musculoskeletal: Negative for back pain, joint swelling and myalgias.  Skin: Negative for pallor, rash and wound.  Neurological: Positive for dizziness and light-headedness. Negative for syncope and headaches.  Psychiatric/Behavioral: Negative for confusion and hallucinations.    Blood pressure 104/68, pulse 82, temperature 99 F (37.2 C), temperature source Oral, resp. rate 19, height 5\' 10"  (1.778 m), weight 57.1 kg, SpO2 92 %. Physical Exam Constitutional:      General: He is not in acute distress.    Appearance: He is ill-appearing. He is not toxic-appearing or diaphoretic.  HENT:     Head: Normocephalic and atraumatic.     Nose: Nose normal. No congestion.     Mouth/Throat:     Mouth: Mucous membranes are moist.     Pharynx: Oropharynx is clear.  Eyes:     Conjunctiva/sclera: Conjunctivae normal.     Pupils: Pupils are equal, round, and reactive to light.  Neck:     Vascular: No carotid bruit.  Cardiovascular:     Rate and Rhythm: Normal rate and regular rhythm.     Heart sounds: No murmur heard. No gallop.   Pulmonary:     Effort: Pulmonary effort is normal. No respiratory distress.     Breath sounds: Normal breath sounds. No wheezing.   Abdominal:     General: Abdomen is flat. Bowel sounds are normal. There is no distension.     Palpations: Abdomen is soft.     Tenderness: There is no abdominal tenderness. There is no guarding or rebound.  Musculoskeletal:  General: No swelling or tenderness. Normal range of motion.     Cervical back: Normal range of motion and neck supple.  Lymphadenopathy:     Cervical: No cervical adenopathy.  Skin:    General: Skin is warm and dry.     Coloration: Skin is not jaundiced.  Neurological:     General: No focal deficit present.     Mental Status: He is alert.     Cranial Nerves: No cranial nerve deficit.     Sensory: No sensory deficit.     Comments: Disoriented to time  Psychiatric:        Mood and Affect: Mood normal.        Behavior: Behavior normal.      Assessment/Plan #1.  Right upper lobe cavitary lesion with infiltrates. Most likely pneumonia with pulm abscess. Discussed case with Dr. Raul Del, who will see the patient today.  Will obtain Quantiferrin.  We will also send for sputum culture. Will treat with Zosyn for now to cover both aerobic and anaerobic pathogens.  2.  Rheumatoid arthritis. Pancytopenia. Felty syndrome. Patient is a chronically on corticosteroids, he is immunocompromised. Patient also has chronic leukopenia and thrombocytopenia.  We will follow.  3.  Possible dementia. Patient has significant memory issues.  She probably has dementia. Patient can be followed with PCP as outpatient for this.  4.  Acute on chronic hypoxemic respiratory failure COPD. Patient seem to have worsening hypoxemia at the time admission, at the baseline, he is on 2 L oxygen, currently on 4 L oxygen.  He does not have any bronchospasm.  5.  Hyponatremia. Follow.   Sharen Hones, MD 08/22/2020, 4:44 PM

## 2020-08-22 NOTE — ED Notes (Signed)
Patient pulled up in bed, placed in gown, mepilex placed on bottom. Provided with warm blanket. No other requests at this time.

## 2020-08-22 NOTE — ED Notes (Addendum)
Pt given cup of water per request at this time. Pt also given urinal and reminded by this RN that a urine specimen needs to be collected.   Pt verbalized understanding. Call bell within reach and pt reminded to make needs known

## 2020-08-22 NOTE — Consult Note (Signed)
Pulmonary Medicine          Date: 08/22/2020,   MRN# [id number] Marcus [name] Sep 03, 1960     AdmissionWeight: 57.1 kg                 CurrentWeight: 57.1 kg      CHIEF COMPLAINT:   Sob, abnormal chest ct sccan   HISTORY OF PRESENT ILLNESS   This is a 60 year old white male, was here last month with weakness, phx of rheumatoid arthritis, pancytopenia, COPD, Felty syndrome, chronic hyponatremia,  protein malnutrition, decondition, poor oral hygiene. He came in today c/o increasing shortness of breath. Also weaker. He is not eating much in the last 2 days. Asked to see regarding abnornnal chest ct scan. ? Need foe bronch.    PAST MEDICAL HISTORY   Past Medical History:  Diagnosis Date  . COPD (chronic obstructive pulmonary disease) ([place])   . 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 Hernia, epigastric   . Pancytopenia ([place])   . Seropositive rheumatoid arthritis ([place])   . Tobacco use disorder      SURGICAL HISTORY   Past Surgical History:  Procedure Laterality Date  . INCISIONAL HERNIA REPAIR  01/17/2020   Procedure: HERNIA REPAIR INCISIONAL AND Silvestre Gunner;  Surgeon: Kinsinger, Arta Bruce, MD;  Location: [facility];  Service: General;;  . LAPAROTOMY N/A 01/17/2020   Procedure: EXPLORATORY LAPAROTOMY;  Surgeon: Kieth Brightly Arta Bruce, MD;  Location: [facility];  Service: General;  Laterality: N/A;     FAMILY HISTORY   Family History  Problem Relation Age of Onset  . CAD Brother   . COPD Brother      SOCIAL HISTORY   Social History   Tobacco Use  . Smoking status: Current Every Day Smoker    Packs/day: 1.00    Types: Cigarettes  . Smokeless tobacco: Never Used  Substance Use Topics  . Alcohol use: No    Alcohol/week: 0.0 standard drinks  . Drug use: Yes    Types: Marijuana     MEDICATIONS    Home Medication:  Current Outpatient Rx  . Order #: [id number] Class: Print  . Order #: [id number] Class: Normal  . Order #: [id number] Class: Historical Med  . Order #:  [id number] Class: Normal  . Order #: [id number] Class: Normal  . Order #: [id number] Class: No Print  . Order #: [id number] Class: Normal    Current Medication:  Current Facility-Administered Medications:  .  albuterol (PROVENTIL) (2.5 MG/3ML) 0.083% nebulizer solution 2.5 mg, 2.5 mg, Nebulization, Q6H PRN, Sharen Hones, MD .  azithromycin (ZITHROMAX) 500 mg in sodium chloride 0.9 % 250 mL IVPB, 500 mg, Intravenous, Once, Harvest Dark, MD .  enoxaparin (LOVENOX) injection 40 mg, 40 mg, Subcutaneous, Q24H, Sharen Hones, MD .  Derrill Memo ON 08/23/2020] feeding supplement (ENSURE ENLIVE / ENSURE PLUS) liquid 237 mL, 237 mL, Oral, BID BM, Sharen Hones, MD .  mometasone-formoterol (DULERA) 100-5 MCG/ACT inhaler 2 puff, 2 puff, Inhalation, BID, Sharen Hones, MD .  pantoprazole (PROTONIX) EC tablet 40 mg, 40 mg, Oral, Daily, Sharen Hones, MD .  piperacillin-tazo (ZOSYN) NICU IV syringe 225 mg/mL, 100 mg/kg of piperacillin, Intravenous, Q8H, Sharen Hones, MD .  Derrill Memo ON 08/23/2020] predniSONE (DELTASONE) tablet 10 mg, 10 mg, Oral, Q breakfast, Sharen Hones, MD  Current Outpatient Medications:  .  albuterol (PROVENTIL) (2.5 MG/3ML) 0.083% nebulizer solution, Take 3 mLs (2.5 mg total) by nebulization every 6 (six) hours as needed for wheezing or shortness of breath., Disp: 75 mL, Rfl: 1 .  albuterol (VENTOLIN HFA) 108 (90 Base) MCG/ACT inhaler, Inhale 2 puffs into the lungs every 6 (six) hours as needed for wheezing or shortness of breath., Disp: 8 g, Rfl: 2 .  predniSONE (DELTASONE) 10 MG tablet, Take 10 mg by mouth daily with breakfast., Disp: , Rfl:  .  feeding supplement (ENSURE ENLIVE / ENSURE PLUS) LIQD, Take 237 mLs by mouth 2 (two) times daily between meals., Disp: 237 mL, Rfl: 12 .  guaiFENesin-dextromethorphan (ROBITUSSIN DM) 100-10 MG/5ML syrup, Take 5 mLs by mouth every 4 (four) hours as needed for cough. (Patient not taking: Reported on 08/03/2020), Disp: 118 mL, Rfl: 0 .  mometasone-formoterol  (DULERA) 100-5 MCG/ACT AERO, Inhale 2 puffs into the lungs 2 (two) times daily. (Patient not taking: Reported on 08/03/2020), Disp: 1 each, Rfl:  .  pantoprazole (PROTONIX) 40 MG tablet, Take 1 tablet (40 mg total) by mouth daily for 10 days., Disp: 10 tablet, Rfl: 0    ALLERGIES   Levaquin [levofloxacin in d5w] and Methotrexate     REVIEW OF SYSTEMS    Review of Systems:  Gen:  Denies  fever, sweats, chills ++ weigh loss  HEENT: Denies blurred vision, double vision, ear pain, eye pain, hearing loss, nose bleeds, sore throat Cardiac:  No dizziness, chest pain or heaviness, chest tightness,edema Resp:   Denies cough or sputum porduction, ++ shortness of breath,wheezing, hemoptysis,  Gi: Denies swallowing difficulty, stomach pain, nausea or vomiting, diarrhea, constipation, bowel incontinence Gu:  Denies bladder incontinence, burning urine Ext:   Denies Joint pain, stiffness or swelling Skin: Denies  skin rash, easy bruising or bleeding or hives Endoc:  Denies polyuria, polydipsia , polyphagia or weight change Psych:   Denies depression, insomnia or hallucinations   Other:  All other systems negative   VS: BP 104/68   Pulse 82   Temp 99 F (37.2 C) (Oral)   Resp 19   Ht 5\' 10"  (1.778 m)   Wt 57.1 kg   SpO2 92%   BMI 18.06 kg/m      PHYSICAL EXAM    GENERAL:NAD, no fevers, frial and globally weak, soft spoken HEAD: Normocephalic, atraumatic. Temporal wasting EYES: Pupils equal, round, reactive to light. Extraocular muscles intact. No scleral icterus.  MOUTH: Moist mucosal membrane. Dentition scant and poor hygiene. No abscess noted.  EAR, NOSE, THROAT: Clear without exudates. No external lesions.  NECK: Supple. No thyromegaly. No nodules. No JVD.  PULMONARY: coarse rhonchi right sided no sinus tracts CARDIOVASCULAR: S1 and S2. Regular rate and rhythm. No murmurs, rubs, or gallops. No edema. Pedal pulses 2+ bilaterally.  GASTROINTESTINAL: Soft, nontender,  nondistended. No masses. Positive bowel sounds. No hepatosplenomegaly. unbilical hernia MUSCULOSKELETAL: No swelling, clubbing, or edema. Range of motion full in all extremities.  NEUROLOGIC: Cranial nerves II through XII are intact. No gross focal neurological deficits. Sensation intact. Reflexes intact.  SKIN: No ulceration, lesions, rashes, or cyanosis. Skin warm and dry. Turgor intact.  PSYCHIATRIC: Mood, affect within normal limits. The patient is awake, alert and oriented x 3. Insight, judgment intact.       IMAGING    CT Chest Wo Contrast  Result Date: 08/22/2020 CLINICAL DATA:  Abnormal x-ray. Interstitial infiltrates with possible right perihilar cavitation. Cough and shortness of breath. EXAM: CT CHEST WITHOUT CONTRAST TECHNIQUE: Multidetector CT imaging of the chest was performed following the standard protocol without IV contrast. COMPARISON:  Radiographs 08/22/2020 and 08/04/2020.  CT 06/10/2020. FINDINGS: Cardiovascular: Extensive three-vessel coronary artery atherosclerosis with lesser involvement of the aorta  and great vessels. The heart size is normal. There is no pericardial effusion. Mediastinum/Nodes: Multiple small mediastinal and hilar lymph nodes have enlarged compared with the chest CT of 2 months ago, likely reactive. No axillary lymphadenopathy. The thyroid gland, trachea and esophagus demonstrate no significant findings. Lungs/Pleura: There is no pleural effusion or pneumothorax. Moderate centrilobular and paraseptal emphysema. As seen on the earlier radiographs, there is a new cavitary mass inferomedially in the right upper lobe, measuring up to 7.8 x 5.5 cm on image 79/4. This has irregular thick walls and associated air-fluid levels. There was no abnormality in this area on the relatively recent prior chest CT, and this is most consistent with cavitating pneumonia. There is an additional cavitary lesion with an air-fluid level medially in the left upper lobe, measuring up  to 2.4 cm on image 65/4. There is mild patchy airspace disease surrounding these cavitary infiltrates. Upper abdomen: Small calcified gallstones. No evidence of biliary dilatation or adrenal mass. Postsurgical changes in the anterior abdominal wall consistent with previous hernia repair. Musculoskeletal/Chest wall: There is no chest wall mass or suspicious osseous finding. There are old fractures of the right 5th and 6th ribs posterolaterally which appear unchanged. IMPRESSION: 1. CT confirms the presence of a cavitary infiltrate in the right upper lobe and shows a smaller area of cavitation anteriorly in the left upper lobe. No abnormalities were present in these areas on CT performed 2 months ago to suggest cavitary neoplasm, and these findings are consistent with cavitating/necrotizing pneumonia. 2. Mildly enlarged mediastinal and hilar lymph nodes, likely reactive. 3. No associated pleural disease. 4. Cholelithiasis. 5. Underlying moderate Emphysema (ICD10-J43.9). 6. Coronary and Aortic Atherosclerosis (ICD10-I70.0). Electronically Signed   By: Richardean Sale M.D.   On: 08/22/2020 14:26   US Venous Img Lower Unilateral Left (DVT)  Result Date: 07/24/2020 CLINICAL DATA:  Left lower extremity edema. EXAM: LEFT LOWER EXTREMITY VENOUS DOPPLER ULTRASOUND TECHNIQUE: Gray-scale sonography with graded compression, as well as color Doppler and duplex ultrasound were performed to evaluate the lower extremity deep venous systems from the level of the common femoral vein and including the common femoral, femoral, profunda femoral, popliteal and calf veins including the posterior tibial, peroneal and gastrocnemius veins when visible. The superficial great saphenous vein was also interrogated. Spectral Doppler was utilized to evaluate flow at rest and with distal augmentation maneuvers in the common femoral, femoral and popliteal veins. COMPARISON:  None. FINDINGS: Contralateral Common Femoral Vein: Respiratory phasicity  is normal and symmetric with the symptomatic side. No evidence of thrombus. Normal compressibility. Common Femoral Vein: No evidence of thrombus. Normal compressibility, respiratory phasicity and response to augmentation. Saphenofemoral Junction: No evidence of thrombus. Normal compressibility and flow on color Doppler imaging. Profunda Femoral Vein: No evidence of thrombus. Normal compressibility and flow on color Doppler imaging. Femoral Vein: No evidence of thrombus. Normal compressibility, respiratory phasicity and response to augmentation. Popliteal Vein: No evidence of thrombus. Normal compressibility, respiratory phasicity and response to augmentation. Calf Veins: No evidence of thrombus. Normal compressibility and flow on color Doppler imaging. Superficial Great Saphenous Vein: No evidence of thrombus. Normal compressibility. Venous Reflux:  None. Other Findings: No evidence of superficial thrombophlebitis or abnormal fluid collection. IMPRESSION: No evidence of left lower extremity deep venous thrombosis. Electronically Signed   By: Aletta Edouard M.D.   On: 07/24/2020 16:08   DG Chest Portable 1 View  Result Date: 08/22/2020 CLINICAL DATA:  Shortness of breath EXAM: PORTABLE CHEST 1 VIEW COMPARISON:  August 04, 2020 FINDINGS:  There is apparent scarring in the right perihilar region. There is suggestion of cavitation in this area in the right perihilar region. Slight scarring lateral right base. The lungs elsewhere are clear. Heart size and pulmonary vascularity are normal. No appreciable adenopathy. No bone lesions. IMPRESSION: Scarring right mid lung. Suggestion of developing cavitation in this area which could be due to overlapping of areas of scarring and normal structures. Given concern for cavitation in this area, noncontrast chest CT advised to further evaluate. Slight scarring lateral right base. Lungs elsewhere are clear. Heart size normal. No adenopathy evident by radiography. Electronically  Signed   By: Lowella Grip III M.D.   On: 08/22/2020 13:24   Portable chest 1 View  Result Date: 08/04/2020 CLINICAL DATA:  Pneumonia. EXAM: PORTABLE CHEST 1 VIEW COMPARISON:  08/03/2020 and 06/29/2020 FINDINGS: Chronic coarse lung markings. No new airspace disease. Heart size is stable. Trachea is midline. Old right rib fractures. Negative for a pneumothorax. IMPRESSION: Chronic lung changes without acute findings. Electronically Signed   By: Markus Daft M.D.   On: 08/04/2020 12:24   DG Chest Port 1 View  Result Date: 08/03/2020 CLINICAL DATA:  Shortness of breath and low-grade fever with possible sepsis. EXAM: PORTABLE CHEST 1 VIEW COMPARISON:  June 29, 2020 FINDINGS: The lungs are hyperinflated. Diffuse, chronic appearing increased interstitial lung markings are noted. There is no evidence of acute infiltrate, pleural effusion or pneumothorax. The heart size and mediastinal contours are within normal limits. Chronic sixth and seventh right rib fractures are seen. IMPRESSION: COPD without acute or active cardiopulmonary disease. Electronically Signed   By: Virgina Norfolk M.D.   On: 08/03/2020 19:24      ASSESSMENT/PLAN   This is an un kept male, poor nutrition here with abnormal chest ct: bilateral apical cavitary like findings which looks more pneumonia , early abcess or infected apical bullae. Agree this does  not look like cancer due to the rapid change from 1-2 months X-rays.  Suggestions: -Would cover anaerobes and MRSA for now ( zosyn, vanco would be good)  -Pulmonary toilet -quantiferon tb gold -If possble sputum c/s, blood cultures pending  -nutrition supplement -social serrvice eval regarding his home situation -hold on bronch for now -dvt prophylaxis  Copd -agree with your present coverage  Mild hyponatremia ( d/dx: nutritional, pulm dz/pneumonia)  -as per above  Pancytopenia, chronic      Thank you for allowing me to participate in the care of this patient.    Patient/Family are satisfied with care plan and all questions have been answered.  This document was prepared using Dragon voice recognition software and may include unintentional dictation errors.     Wallene Huh, M.D.  Division of [facility]

## 2020-08-22 NOTE — ED Notes (Signed)
Pt cleaned up at this time. Clean chux and brief placed at this time. Male primofit placed at this time. Pt tolerated rolling well.   Pt requesting something for cough. MD Paduchowski aware at this time.

## 2020-08-22 NOTE — ED Notes (Signed)
Lab at bedside to draw cultures at this time

## 2020-08-22 NOTE — ED Triage Notes (Signed)
Pt arrived via [facility] from home with c/o failure to thrive. Per EMS, pt is unable to get out of bed, control bowels, and refuses to eat. Per EMS, pt needs placement as 60 year old mother cannot take care of pt.  Per EMS, pt wears O2 at home. Per EMS, pt in sinus tach on monitor.   Audible cough heard at this time.

## 2020-08-22 NOTE — ED Provider Notes (Signed)
[facility] Emergency Department Provider Note  Time seen: 12:44 PM  I have reviewed the triage vital signs and the nursing notes.   HISTORY  Chief Complaint Failure To Thrive   HPI Marcus [name] is a 60 y.o. male with a past medical history of COPD, generalized weakness, CHF, chronic O2 use, presents to the emergency department for generalized weakness and shortness of breath.  According to the patient he has been experiencing worsening shortness of breath of the past 6 months.  Patient states he is weak over the past several weeks.  Per EMS report patient lives with his mother and the mother states the patient has not been eating or drinking much over the past several days and has not gotten out of bed for the past 2 days and she is unable to help him due to her age and disabilities.   Past Medical History:  Diagnosis Date  . COPD (chronic obstructive pulmonary disease) ([place])   . 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 Hernia, epigastric   . Pancytopenia ([place])   . Seropositive rheumatoid arthritis ([place])   . Tobacco use disorder     Patient Active Problem List   Diagnosis Date Noted  . Hyponatremia 08/04/2020  . Generalized weakness 08/04/2020  . Dehydration 08/03/2020  . Septic shock ([place]) 06/29/2020  . CAP (community acquired pneumonia) 06/29/2020  . COPD exacerbation ([place]) 06/11/2020  . Chest pain 06/11/2020  . Cellulitis 06/11/2020  . Chronic diastolic CHF (congestive heart failure) ([place]) 06/11/2020  . Severe sepsis with septic shock ([place]) 01/20/2020  . Acute hypoxemic respiratory failure ([place]) 01/20/2020  . Hypokalemia 01/20/2020  . Perforated prepyloric ulcer 01/18/2020 01/18/2020  . Pneumoperitoneum 01/17/2020  . COPD with acute exacerbation ([place]) 08/18/2019  . 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 Anemia 11/14/2015  . Seropositive rheumatoid arthritis ([place])   . Chronic obstructive pulmonary disease ([place])   . Moderate protein-calorie malnutrition ([place]) 07/24/2015   . Pancytopenia ([place]) 07/23/2015  . 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[place]) 06/06/2013  . Leukopenia 10/21/2012  . 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 Abnormal EKG 10/21/2012  . Joint pain 10/21/2012  . Splenomegaly 10/21/2012    Past Surgical History:  Procedure Laterality Date  . INCISIONAL HERNIA REPAIR  01/17/2020   Procedure: HERNIA REPAIR INCISIONAL AND Silvestre Gunner;  Surgeon: Kinsinger, Arta Bruce, MD;  Location: [facility];  Service: General;;  . LAPAROTOMY N/A 01/17/2020   Procedure: EXPLORATORY LAPAROTOMY;  Surgeon: Kieth Brightly Arta Bruce, MD;  Location: [facility];  Service: General;  Laterality: N/A;    Prior to Admission medications   Medication Sig Start Date End Date Taking? Authorizing Provider  albuterol (PROVENTIL) (2.5 MG/3ML) 0.083% nebulizer solution Take 3 mLs (2.5 mg total) by nebulization every 6 (six) hours as needed for wheezing or shortness of breath. 01/24/20   Kinsinger, Arta Bruce, MD  albuterol (VENTOLIN HFA) 108 (90 Base) MCG/ACT inhaler Inhale 2 puffs into the lungs every 6 (six) hours as needed for wheezing or shortness of breath. 08/04/20   Manuella Ghazi, Pratik D, DO  feeding supplement (ENSURE ENLIVE / ENSURE PLUS) LIQD Take 237 mLs by mouth 2 (two) times daily between meals. 08/04/20   Manuella Ghazi, Pratik D, DO  guaiFENesin-dextromethorphan (ROBITUSSIN DM) 100-10 MG/5ML syrup Take 5 mLs by mouth every 4 (four) hours as needed for cough. Patient not taking: Reported on 08/03/2020 07/01/20   Heath Lark D, DO  mometasone-formoterol (DULERA) 100-5 MCG/ACT AERO Inhale 2 puffs into the lungs 2 (two) times daily. Patient not taking: Reported on 08/03/2020  06/12/20   Bonnielee Haff, MD  pantoprazole (PROTONIX) 40 MG tablet Take 1 tablet (40 mg total) by mouth daily for 10 days. 07/02/20 07/12/20  Manuella Ghazi, Pratik D, DO  predniSONE (DELTASONE) 10 MG tablet Take 10 mg by mouth daily with breakfast.    [provider]    Allergies  Allergen Reactions  . Levaquin [Levofloxacin In D5w] Other (See Comments)     Chest pain  . Methotrexate Other (See Comments)    Chest pain    Family History  Problem Relation Age of Onset  . CAD Brother   . COPD Brother     Social History Social History   Tobacco Use  . Smoking status: Current Every Day Smoker    Packs/day: 1.00    Types: Cigarettes  . Smokeless tobacco: Never Used  Substance Use Topics  . Alcohol use: No    Alcohol/week: 0.0 standard drinks  . Drug use: Yes    Types: Marijuana    Review of Systems Constitutional: Negative for fever.  Positive for generalized weakness. Cardiovascular: Negative for chest pain. Respiratory: Positive shortness of breath.  Positive for cough. Gastrointestinal: Negative for abdominal pain, vomiting  Musculoskeletal: Negative for musculoskeletal complaints Neurological: Negative for headache All other ROS negative  ____________________________________________   PHYSICAL EXAM:  VITAL SIGNS: ED Triage Vitals  Enc Vitals Group     BP 08/22/20 1219 116/70     Pulse Rate 08/22/20 1219 (!) 119     Resp 08/22/20 1219 19     Temp 08/22/20 1217 99 F (37.2 C)     Temp Source 08/22/20 1217 Oral     SpO2 08/22/20 1219 92 %     Weight 08/22/20 1220 125 lb 14.1 oz (57.1 kg)     Height 08/22/20 1220 5\' 10"  (1.778 m)     Head Circumference --      Peak Flow --      Pain Score 08/22/20 1220 7     Pain Loc --      Pain Edu? --      Excl. in [place]? --    Constitutional: Alert and oriented. Well appearing and in no distress. Eyes: Normal exam ENT      Head: Normocephalic and atraumatic.      Mouth/Throat: Dry mucous membranes Cardiovascular: Normal rate, regular rhythm around 100 to 110 bpm.  Respiratory: Normal respiratory effort without tachypnea nor retractions.  Mild expiratory wheezes. Gastrointestinal: Soft and nontender. No distention.   Musculoskeletal: Nontender with normal range of motion in all extremities. No lower extremity tenderness or edema. Neurologic:  Normal speech and language.  No gross focal neurologic deficits  Skin:  Skin is warm, dry and intact.  Psychiatric: Mood and affect are normal.   ____________________________________________    EKG  EKG viewed and interpreted by myself shows a normal sinus rhythm 87 bpm with a slightly widened QRS, left axis deviation, largely normal intervals besides slight QTC prolongation, nonspecific ST changes with inferolateral T wave inversions  ____________________________________________    RADIOLOGY  Chest x-ray shows possible cavitary lesion. CT confirms cavitary lesion most likely cavitary pneumonia.  ____________________________________________   INITIAL IMPRESSION / ASSESSMENT AND PLAN / ED COURSE  Pertinent labs & imaging results that were available during my care of the patient were reviewed by me and considered in my medical decision making (see chart for details).   Patient presents emergency department by EMS for shortness of breath and generalized weakness worsening over the past several weeks to the  point where he is no longer able to get out of bed.  Lives with his elderly mother who is unable to help him.  Patient does appear somewhat dehydrated clinically.  We will check labs including cardiac enzymes obtain a chest x-ray COVID swab we will IV hydrate and treat with duo nebs while awaiting results.  Patient agreeable to plan of care.  Patient's work-up shows an abnormal EKG although nonconcerning troponin and the patient has no chest pain.  Chest x-ray concerning for possible pneumonia CT confirms what appears to be cavitary pneumonia.  We will treat with IV antibiotics, send blood cultures and admit the patient to the hospital service for further work-up and treatment.  Cameron Proud was evaluated in Emergency Department on 08/22/2020 for the symptoms described in the history of present illness. He was evaluated in the context of the global COVID-19 pandemic, which necessitated consideration that the patient  might be at risk for infection with the SARS-CoV-2 virus that causes COVID-19. Institutional protocols and algorithms that pertain to the evaluation of patients at risk for COVID-19 are in a state of rapid change based on information released by regulatory bodies including the CDC and federal and state organizations. These policies and algorithms were followed during the patient's care in the ED.  ____________________________________________   FINAL CLINICAL IMPRESSION(S) / ED DIAGNOSES  Pneumonia Weakness   Harvest Dark, MD 08/22/20 1515

## 2020-08-22 NOTE — ED Notes (Signed)
Pt to CT at this time.

## 2020-08-22 NOTE — ED Notes (Signed)
Pt given warm blanket at this time 

## 2020-08-23 DIAGNOSIS — I5032 Chronic diastolic (congestive) heart failure: Secondary | ICD-10-CM | POA: Diagnosis not present

## 2020-08-23 DIAGNOSIS — J189 Pneumonia, unspecified organism: Secondary | ICD-10-CM | POA: Diagnosis not present

## 2020-08-23 DIAGNOSIS — J9621 Acute and chronic respiratory failure with hypoxia: Secondary | ICD-10-CM | POA: Diagnosis not present

## 2020-08-23 DIAGNOSIS — J449 Chronic obstructive pulmonary disease, unspecified: Secondary | ICD-10-CM | POA: Diagnosis not present

## 2020-08-23 MED ORDER — VANCOMYCIN HCL IN DEXTROSE 1-5 GM/200ML-% IV SOLN: 1000.0000 mg | Freq: Once | INTRAVENOUS | Status: DC

## 2020-08-23 MED ORDER — VANCOMYCIN HCL 1250 MG/250ML IV SOLN
1250.0000 mg | Freq: Two times a day (BID) | INTRAVENOUS | Status: DC
  Filled 2020-08-23 (×2): qty 250

## 2020-08-23 MED ORDER — VANCOMYCIN HCL IN DEXTROSE 1-5 GM/200ML-% IV SOLN
1000.0000 mg | Freq: Once | INTRAVENOUS | Status: AC
  Administered 2020-08-23: 1000 mg via INTRAVENOUS
  Filled 2020-08-23: qty 200

## 2020-08-23 MED ORDER — VANCOMYCIN HCL 750 MG/150ML IV SOLN
750.0000 mg | Freq: Two times a day (BID) | INTRAVENOUS | Status: DC
  Administered 2020-08-23 – 2020-08-25 (×4): 750 mg via INTRAVENOUS
  Filled 2020-08-23 (×7): qty 150

## 2020-08-23 MED ORDER — VANCOMYCIN HCL 750 MG IV SOLR
750.0000 mg | Freq: Two times a day (BID) | INTRAVENOUS | Status: DC
  Filled 2020-08-23: qty 750

## 2020-08-23 NOTE — ED Notes (Signed)
previous RN reported that if pt HR was sustained at 45 we were to admin atropine. HR is hovering around the 47 BPM mark with short periods dipping down to 42 and up to 52 . oxygen sat has also had a couple times where it dipped to 88% but comes back up to 92-93%. No orders placed to admin atropin at this time. MD notified and reports to continue monitoring pt condition, no new orders placed at this time.

## 2020-08-23 NOTE — Consult Note (Signed)
Pharmacy Antibiotic Note  Marcus [name] is a 60 y.o. male admitted on 08/22/2020 with pumonary abscess.   Pharmacy has been consulted for vancomycin dosing.  Plan: Will start with a loading dose of Vancomycin 1000mg  IV x 1,   Will follow with Vancomycin 750 mg IV Q 12 hrs.  Goal AUC 400-550. Expected AUC: 513 SCr used: 0.8   Height: 5\' 10"  (177.8 cm) Weight: 57.1 kg (125 lb 14.1 oz) IBW/kg (Calculated) : 73  Temp (24hrs), Avg:99 F (37.2 C), Min:99 F (37.2 C), Max:99 F (37.2 C)  Recent Labs  Lab 08/22/20 1229  WBC 3.1*  CREATININE 0.79    Estimated Creatinine Clearance: 80.3 mL/min (by C-G formula based on SCr of 0.79 mg/dL).    Allergies  Allergen Reactions  . Levaquin [Levofloxacin In D5w] Other (See Comments)    Chest pain  . Methotrexate Other (See Comments)    Chest pain    Antimicrobials this admission: 5/18 Azithromycin/Ceftriaxone x 1 5/18 Zosyn >> 5/19 Vancomycin >>   Dose adjustments this admission: none  Microbiology results: 5/18 BCx: pending  5/19 Sputum: pending  5/19 MRSA culture: pending COVID/FLU NEG  Thank you for allowing pharmacy to be a part of this patient's care.  Lu Duffel, PharmD, BCPS Clinical Pharmacist 08/23/2020 8:07 AM

## 2020-08-23 NOTE — ED Notes (Signed)
Family at bedside, bed sat up so pt can eat lunch tray

## 2020-08-23 NOTE — ED Notes (Signed)
Patient eating breakfast tray at this time. 

## 2020-08-23 NOTE — ED Notes (Signed)
Meal tray at bedside.  

## 2020-08-23 NOTE — ED Notes (Signed)
Lab at bedside to collect blood work 

## 2020-08-23 NOTE — Progress Notes (Signed)
PROGRESS NOTE    Marcus [name]  VVO:[id number] DOB: 1961/02/13 DOA: 08/22/2020 PCP: [name], FNP   CC.  Shortness of breath Brief Narrative:  Marcus [name] is a 60-year-male with history of COPD, chronic hypoxemic respiratory failure on 2 L oxygen, rheumatoid arthritis, pancytopenia, Felty syndrome who came to the hospital with complaints of short of breath and a cough for at least a month. Upon arriving the emergency room, his CT scan showed cavitary lesion in the right upper lobe.  Suspect lung abscess, he is seen by pulmonology, placed on Zosyn, also added vancomycin.  Assessment & Plan:   Active Problems:   Moderate protein-calorie malnutrition (HCC)   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HCC)   Chronic obstructive pulmonary disease (HCC)   Chronic diastolic CHF (congestive heart failure) (HCC)   Hyponatremia   Cavitary pneumonia   Acute on chronic respiratory failure with hypoxia ([place])  #1.  Upper lobe cavitary lesion with infiltrates. Appreciate pulmonology consult.  Pending sputum culture, blood culture.  Also obtain acid-fast stain x3.  MRSA culture was also be obtained.  Continue vancomycin and Zosyn for now.  Will discontinue vancomycin if MRSA is ruled out. Most likely patient will be discharged home with oral antibiotics after work-up is completed.  2.  Acute on chronic hypoxemic respiratory failure. COPD. Oxygenation seem to be better today.  No bronchospasm.  #3.  Rheumatoid arthritis. Pancytopenia. Felty syndrome. Continue to follow. Resumed lower dose corticosteroids.  #4.  Possible dementia. Patient be followed by PCP as outpatient.  5.  Hypokalemia Improved.    DVT prophylaxis: Lovenox Code Status: Full Family Communication: Brother at the bedside Disposition Plan:  .   Status is: Inpatient  Remains inpatient appropriate because:Inpatient level of care appropriate due to severity of illness   Dispo: The patient is from: Home              Anticipated d/c is to:  Home              Patient currently is not medically stable to d/c.   Difficult to place patient No        I/O last 3 completed shifts: In: 493.8 [IV Piggyback:493.8] Out: 800 [Urine:800] Total I/O In: 800.8 [I.V.:606; IV Piggyback:194.8] Out: -      Consultants:   Pulmonology  Procedures: None  Antimicrobials:  Zosyn and vancomycin.  Subjective: Patient currently on 3 L oxygen, does not feel short of breath. Denies any abdominal pain or nausea vomiting. No fever chills. No dysuria hematuria.  Objective: Vitals:   08/23/20 1200 08/23/20 1309 08/23/20 1330 08/23/20 1332  BP: 100/66 (!) 123/104 94/60   Pulse: (!) 50 (!) 52 (!) 46 (!) 49  Resp: (!) 23 (!) 28 (!) 24 (!) 21  Temp:      TempSrc:      SpO2: 93% 93% 94% 94%  Weight:      Height:        Intake/Output Summary (Last 24 hours) at 08/23/2020 1452 Last data filed at 08/23/2020 1230 Gross per 24 hour  Intake 1294.59 ml  Output 800 ml  Net 494.59 ml   Filed Weights   08/22/20 1220  Weight: 57.1 kg    Examination:  General exam: Appears calm and comfortable  Respiratory system: Clear to auscultation. Respiratory effort normal. Cardiovascular system: S1 & S2 heard, RRR. No JVD, murmurs, rubs, gallops or clicks. No pedal edema. Gastrointestinal system: Abdomen is nondistended, soft and nontender. No organomegaly or masses felt. Normal bowel sounds heard.  Central nervous system: Alert and oriented x2. No focal neurological deficits. Extremities: Symmetric 5 x 5 power. Skin: No rashes, lesions or ulcers Psychiatry: Judgement and insight appear normal. Mood & affect appropriate.     Data Reviewed: I have personally reviewed following labs and imaging studies  CBC: Recent Labs  Lab 08/22/20 1229  WBC 3.1*  HGB 13.9  HCT 43.2  MCV 84.5  PLT 347*   Basic Metabolic Panel: Recent Labs  Lab 08/22/20 1229  NA 132*  K 4.1  CL 98  CO2 25  GLUCOSE 169*  BUN 27*  CREATININE 0.79  CALCIUM  8.0*   GFR: Estimated Creatinine Clearance: 80.3 mL/min (by C-G formula based on SCr of 0.79 mg/dL). Liver Function Tests: Recent Labs  Lab 08/22/20 1229  AST 37  ALT 18  ALKPHOS 116  BILITOT 1.1  PROT 6.6  ALBUMIN 2.0*   No results for input(s): LIPASE, AMYLASE in the last 168 hours. No results for input(s): AMMONIA in the last 168 hours. Coagulation Profile: No results for input(s): INR, PROTIME in the last 168 hours. Cardiac Enzymes: No results for input(s): CKTOTAL, CKMB, CKMBINDEX, TROPONINI in the last 168 hours. BNP (last 3 results) No results for input(s): PROBNP in the last 8760 hours. HbA1C: No results for input(s): HGBA1C in the last 72 hours. CBG: No results for input(s): GLUCAP in the last 168 hours. Lipid Profile: No results for input(s): CHOL, HDL, LDLCALC, TRIG, CHOLHDL, LDLDIRECT in the last 72 hours. Thyroid Function Tests: No results for input(s): TSH, T4TOTAL, FREET4, T3FREE, THYROIDAB in the last 72 hours. Anemia Panel: No results for input(s): VITAMINB12, FOLATE, FERRITIN, TIBC, IRON, RETICCTPCT in the last 72 hours. Sepsis Labs: No results for input(s): PROCALCITON, LATICACIDVEN in the last 168 hours.  Recent Results (from the past 240 hour(s))  Resp Panel by RT-PCR (Flu A&B, Covid) Nasopharyngeal Swab     Status: None   Collection Time: 08/22/20 12:45 PM   Specimen: Nasopharyngeal Swab; Nasopharyngeal(NP) swabs in vial transport medium  Result Value Ref Range Status   SARS Coronavirus 2 by RT PCR NEGATIVE NEGATIVE Final    Comment: (NOTE) SARS-CoV-2 target nucleic acids are NOT DETECTED.  The SARS-CoV-2 RNA is generally detectable in upper respiratory specimens during the acute phase of infection. The lowest concentration of SARS-CoV-2 viral copies this assay can detect is 138 copies/mL. A negative result does not preclude SARS-Cov-2 infection and should not be used as the sole basis for treatment or other patient management decisions. A  negative result may occur with  improper specimen collection/handling, submission of specimen other than nasopharyngeal swab, presence of viral mutation(s) within the areas targeted by this assay, and inadequate number of viral copies(<138 copies/mL). A negative result must be combined with clinical observations, patient history, and epidemiological information. The expected result is Negative.  Fact Sheet for Patients:  EntrepreneurPulse.com.au  Fact Sheet for Healthcare Providers:  IncredibleEmployment.be  This test is no t yet approved or cleared by the [place] FDA and  has been authorized for detection and/or diagnosis of SARS-CoV-2 by FDA under an Emergency Use Authorization (EUA). This EUA will remain  in effect (meaning this test can be used) for the duration of the COVID-19 declaration under Section 564(b)(1) of the Act, 21 U.S.C.section 360bbb-3(b)(1), unless the authorization is terminated  or revoked sooner.       Influenza A by PCR NEGATIVE NEGATIVE Final   Influenza B by PCR NEGATIVE NEGATIVE Final    Comment: (NOTE) The Xpert Xpress  SARS-CoV-2/FLU/RSV plus assay is intended as an aid in the diagnosis of influenza from Nasopharyngeal swab specimens and should not be used as a sole basis for treatment. Nasal washings and aspirates are unacceptable for Xpert Xpress SARS-CoV-2/FLU/RSV testing.  Fact Sheet for Patients: EntrepreneurPulse.com.au  Fact Sheet for Healthcare Providers: IncredibleEmployment.be  This test is not yet approved or cleared by the [place] FDA and has been authorized for detection and/or diagnosis of SARS-CoV-2 by FDA under an Emergency Use Authorization (EUA). This EUA will remain in effect (meaning this test can be used) for the duration of the COVID-19 declaration under Section 564(b)(1) of the Act, 21 U.S.C. section 360bbb-3(b)(1), unless the authorization  is terminated or revoked.  Performed at [facility], [address] [place], [place] [address]   Blood culture (routine x 2)     Status: None (Preliminary result)   Collection Time: 08/22/20  3:18 PM   Specimen: BLOOD  Result Value Ref Range Status   Specimen Description BLOOD RIGHT ANTECUBITAL  Final   Special Requests   Final    BOTTLES DRAWN AEROBIC AND ANAEROBIC Blood Culture adequate volume   Culture   Final    NO GROWTH < 24 HOURS Performed at [facility], [address], [place], [place] [address]    Report Status PENDING  Incomplete  Blood culture (routine x 2)     Status: None (Preliminary result)   Collection Time: 08/22/20  3:18 PM   Specimen: BLOOD  Result Value Ref Range Status   Specimen Description BLOOD LEFT ANTECUBITAL  Final   Special Requests   Final    BOTTLES DRAWN AEROBIC AND ANAEROBIC Blood Culture adequate volume   Culture   Final    NO GROWTH < 24 HOURS Performed at [facility], [address], [place], [place] [address]    Report Status PENDING  Incomplete         Radiology Studies: CT Chest Wo Contrast  Result Date: 08/22/2020 CLINICAL DATA:  Abnormal x-ray. Interstitial infiltrates with possible right perihilar cavitation. Cough and shortness of breath. EXAM: CT CHEST WITHOUT CONTRAST TECHNIQUE: Multidetector CT imaging of the chest was performed following the standard protocol without IV contrast. COMPARISON:  Radiographs 08/22/2020 and 08/04/2020.  CT 06/10/2020. FINDINGS: Cardiovascular: Extensive three-vessel coronary artery atherosclerosis with lesser involvement of the aorta and great vessels. The heart size is normal. There is no pericardial effusion. Mediastinum/Nodes: Multiple small mediastinal and hilar lymph nodes have enlarged compared with the chest CT of 2 months ago, likely reactive. No axillary lymphadenopathy. The thyroid gland, trachea and esophagus demonstrate no significant findings. Lungs/Pleura:  There is no pleural effusion or pneumothorax. Moderate centrilobular and paraseptal emphysema. As seen on the earlier radiographs, there is a new cavitary mass inferomedially in the right upper lobe, measuring up to 7.8 x 5.5 cm on image 79/4. This has irregular thick walls and associated air-fluid levels. There was no abnormality in this area on the relatively recent prior chest CT, and this is most consistent with cavitating pneumonia. There is an additional cavitary lesion with an air-fluid level medially in the left upper lobe, measuring up to 2.4 cm on image 65/4. There is mild patchy airspace disease surrounding these cavitary infiltrates. Upper abdomen: Small calcified gallstones. No evidence of biliary dilatation or adrenal mass. Postsurgical changes in the anterior abdominal wall consistent with previous hernia repair. Musculoskeletal/Chest wall: There is no chest wall mass or suspicious osseous finding. There are old fractures of the right 5th  and 6th ribs posterolaterally which appear unchanged. IMPRESSION: 1. CT confirms the presence of a cavitary infiltrate in the right upper lobe and shows a smaller area of cavitation anteriorly in the left upper lobe. No abnormalities were present in these areas on CT performed 2 months ago to suggest cavitary neoplasm, and these findings are consistent with cavitating/necrotizing pneumonia. 2. Mildly enlarged mediastinal and hilar lymph nodes, likely reactive. 3. No associated pleural disease. 4. Cholelithiasis. 5. Underlying moderate Emphysema (ICD10-J43.9). 6. Coronary and Aortic Atherosclerosis (ICD10-I70.0). Electronically Signed   By: Richardean Sale M.D.   On: 08/22/2020 14:26   DG Chest Portable 1 View  Result Date: 08/22/2020 CLINICAL DATA:  Shortness of breath EXAM: PORTABLE CHEST 1 VIEW COMPARISON:  August 04, 2020 FINDINGS: There is apparent scarring in the right perihilar region. There is suggestion of cavitation in this area in the right perihilar  region. Slight scarring lateral right base. The lungs elsewhere are clear. Heart size and pulmonary vascularity are normal. No appreciable adenopathy. No bone lesions. IMPRESSION: Scarring right mid lung. Suggestion of developing cavitation in this area which could be due to overlapping of areas of scarring and normal structures. Given concern for cavitation in this area, noncontrast chest CT advised to further evaluate. Slight scarring lateral right base. Lungs elsewhere are clear. Heart size normal. No adenopathy evident by radiography. Electronically Signed   By: Lowella Grip III M.D.   On: 08/22/2020 13:24        Scheduled Meds: . enoxaparin (LOVENOX) injection  40 mg Subcutaneous Q24H  . feeding supplement  237 mL Oral BID BM  . mometasone-formoterol  2 puff Inhalation BID  . pantoprazole  40 mg Oral Daily  . predniSONE  10 mg Oral Q breakfast   Continuous Infusions: . piperacillin-tazobactam (ZOSYN)  IV Stopped (08/23/20 1130)  . vancomycin       LOS: 1 day    Time spent: 28 minutes    Sharen Hones, MD [facility]   To contact the attending provider between 7A-7P or the covering provider during after hours 7P-7A, please log into the web site [email or web address] and access using universal  password for that web site. If you do not have the password, please call the hospital operator.  08/23/2020, 2:52 PM

## 2020-08-23 NOTE — ED Notes (Signed)
Patient found trying to get out of bed to use restroom. Patient had urinated in floor and had BM in bed. Patient reoriented and cleaned up with new liens and gown. Patient placed back on a male purewick and back on the monitor.

## 2020-08-23 NOTE — Progress Notes (Signed)
Pulmonary Medicine          D  HISTORY OF PRESENT ILLNESS   Today, in er awaiting hospital bed. Dyspnea no worse, frial, weak, demented, no feve or septic parameters. Voices no new complaints. See last note   PAST MEDICAL HISTORY   Past Medical History:  Diagnosis Date  . COPD (chronic obstructive pulmonary disease) ([place])   . 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 Hernia, epigastric   . Pancytopenia ([place])   . Seropositive rheumatoid arthritis ([place])   . Tobacco use disorder      SURGICAL HISTORY   Past Surgical History:  Procedure Laterality Date  . INCISIONAL HERNIA REPAIR  01/17/2020   Procedure: HERNIA REPAIR INCISIONAL AND Silvestre Gunner;  Surgeon: Kinsinger, Arta Bruce, MD;  Location: [facility];  Service: General;;  . LAPAROTOMY N/A 01/17/2020   Procedure: EXPLORATORY LAPAROTOMY;  Surgeon: Kieth Brightly Arta Bruce, MD;  Location: [facility];  Service: General;  Laterality: N/A;     FAMILY HISTORY   Family History  Problem Relation Age of Onset  . CAD Brother   . COPD Brother      SOCIAL HISTORY   Social History   Tobacco Use  . Smoking status: Current Every Day Smoker    Packs/day: 1.00    Types: Cigarettes  . Smokeless tobacco: Never Used  Substance Use Topics  . Alcohol use: No    Alcohol/week: 0.0 standard drinks  . Drug use: Yes    Types: Marijuana     MEDICATIONS    Home Medication:  Current Outpatient Rx  . Order #: [id number] Class: Print  . Order #: [id number] Class: Normal  . Order #: [id number] Class: Historical Med  . Order #: [id number] Class: Normal  . Order #: [id number] Class: Normal  . Order #: [id number] Class: No Print  . Order #: [id number] Class: Normal    Current Medication:  Current Facility-Administered Medications:  .  0.9 %  sodium chloride infusion, , Intravenous, Continuous, Mansy, Arvella Merles, MD, Stopped at 08/23/20 [phone] .  albuterol (PROVENTIL) (2.5 MG/3ML) 0.083% nebulizer solution 2.5 mg, 2.5 mg, Nebulization, Q6H PRN, Sharen Hones, MD,  2.5 mg at 08/22/20 2102 .  enoxaparin (LOVENOX) injection 40 mg, 40 mg, Subcutaneous, Q24H, Sharen Hones, MD, 40 mg at 08/22/20 2103 .  feeding supplement (ENSURE ENLIVE / ENSURE PLUS) liquid 237 mL, 237 mL, Oral, BID BM, Sharen Hones, MD, 237 mL at 08/23/20 1131 .  mometasone-formoterol (DULERA) 100-5 MCG/ACT inhaler 2 puff, 2 puff, Inhalation, BID, Sharen Hones, MD, 2 puff at 08/23/20 1131 .  pantoprazole (PROTONIX) EC tablet 40 mg, 40 mg, Oral, Daily, Sharen Hones, MD, 40 mg at 08/23/20 0736 .  piperacillin-tazobactam (ZOSYN) IVPB 3.375 g, 3.375 g, Intravenous, Q8H, Sharen Hones, MD, Stopped at 08/23/20 1130 .  predniSONE (DELTASONE) tablet 10 mg, 10 mg, Oral, Q breakfast, Sharen Hones, MD, 10 mg at 08/23/20 0736 .  [COMPLETED] vancomycin (VANCOCIN) IVPB 1000 mg/200 mL premix, 1,000 mg, Intravenous, Once, Stopped at 08/23/20 1230 **FOLLOWED BY** vancomycin (VANCOREADY) IVPB 750 mg/150 mL, 750 mg, Intravenous, Q12H, Sharen Hones, MD  Current Outpatient Medications:  .  albuterol (PROVENTIL) (2.5 MG/3ML) 0.083% nebulizer solution, Take 3 mLs (2.5 mg total) by nebulization every 6 (six) hours as needed for wheezing or shortness of breath., Disp: 75 mL, Rfl: 1 .  albuterol (VENTOLIN HFA) 108 (90 Base) MCG/ACT inhaler, Inhale 2 puffs into the lungs every 6 (six) hours as needed for wheezing or shortness of breath., Disp: 8 g, Rfl: 2 .  predniSONE (  DELTASONE) 10 MG tablet, Take 10 mg by mouth daily with breakfast., Disp: , Rfl:  .  feeding supplement (ENSURE ENLIVE / ENSURE PLUS) LIQD, Take 237 mLs by mouth 2 (two) times daily between meals., Disp: 237 mL, Rfl: 12 .  guaiFENesin-dextromethorphan (ROBITUSSIN DM) 100-10 MG/5ML syrup, Take 5 mLs by mouth every 4 (four) hours as needed for cough. (Patient not taking: Reported on 08/03/2020), Disp: 118 mL, Rfl: 0 .  mometasone-formoterol (DULERA) 100-5 MCG/ACT AERO, Inhale 2 puffs into the lungs 2 (two) times daily. (Patient not taking: Reported on 08/03/2020),  Disp: 1 each, Rfl:  .  pantoprazole (PROTONIX) 40 MG tablet, Take 1 tablet (40 mg total) by mouth daily for 10 days., Disp: 10 tablet, Rfl: 0    ALLERGIES   Levaquin [levofloxacin in d5w] and Methotrexate     REVIEW OF SYSTEMS    Review of Systems:  Gen:  Denies  fever, sweats, chills weigh loss  HEENT: Denies blurred vision, double vision, ear pain, eye pain, hearing loss, nose bleeds, sore throat Cardiac:  No dizziness, chest pain or heaviness, chest tightness,edema Resp:   Denies cough or sputum porduction, shortness of breath,wheezing, hemoptysis,  Gi: Denies swallowing difficulty, stomach pain, nausea or vomiting, diarrhea, constipation, bowel incontinence Gu:  Denies bladder incontinence, burning urine Ext:   Denies Joint pain, stiffness or swelling Skin: Denies  skin rash, easy bruising or bleeding or hives Endoc:  Denies polyuria, polydipsia , polyphagia or weight change Psych:   Denies depression, insomnia or hallucinations   Other:  All other systems negative   VS: BP 94/60   Pulse (!) 49   Temp 99 F (37.2 C) (Oral)   Resp (!) 21   Ht 5\' 10"  (1.778 m)   Wt 57.1 kg   SpO2 94%   BMI 18.06 kg/m      PHYSICAL EXAM    GENERAL:NAD, no fevers, chills, no weakness no fatigue, frial and thin HEAD: Normocephalic, atraumatic.  EYES: Pupils equal, round, reactive to light. Extraocular muscles intact. No scleral icterus.  MOUTH: Moist mucosal membrane. Dentition intact. No abscess noted.  EAR, NOSE, THROAT: Clear without exudates. No external lesions.  NECK: Supple. No thyromegaly. No nodules. No JVD.  PULMONARY: coarse rhonchi  CARDIOVASCULAR: S1 and S2. Regular rate and rhythm. No murmurs, rubs, or gallops. No edema. Pedal pulses 2+ bilaterally.  GASTROINTESTINAL: Soft, nontender, nondistended. No masses. Positive bowel sounds. No hepatosplenomegaly.  MUSCULOSKELETAL: No swelling, clubbing, or edema. Range of motion full in all extremities.  NEUROLOGIC:  Cranial nerves II through XII are intact. No gross focal neurological deficits. Sensation intact. Reflexes intact.  SKIN: No ulceration, lesions, rashes, or cyanosis. Skin warm and dry. Turgor intact.  PSYCHIATRIC: Mood, affect within normal limits. The patient is awake, alert and oriented x 3. Insight, judgment intact.       IMAGING    CT Chest Wo Contrast  Result Date: 08/22/2020 CLINICAL DATA:  Abnormal x-ray. Interstitial infiltrates with possible right perihilar cavitation. Cough and shortness of breath. EXAM: CT CHEST WITHOUT CONTRAST TECHNIQUE: Multidetector CT imaging of the chest was performed following the standard protocol without IV contrast. COMPARISON:  Radiographs 08/22/2020 and 08/04/2020.  CT 06/10/2020. FINDINGS: Cardiovascular: Extensive three-vessel coronary artery atherosclerosis with lesser involvement of the aorta and great vessels. The heart size is normal. There is no pericardial effusion. Mediastinum/Nodes: Multiple small mediastinal and hilar lymph nodes have enlarged compared with the chest CT of 2 months ago, likely reactive. No axillary lymphadenopathy. The thyroid gland, trachea  and esophagus demonstrate no significant findings. Lungs/Pleura: There is no pleural effusion or pneumothorax. Moderate centrilobular and paraseptal emphysema. As seen on the earlier radiographs, there is a new cavitary mass inferomedially in the right upper lobe, measuring up to 7.8 x 5.5 cm on image 79/4. This has irregular thick walls and associated air-fluid levels. There was no abnormality in this area on the relatively recent prior chest CT, and this is most consistent with cavitating pneumonia. There is an additional cavitary lesion with an air-fluid level medially in the left upper lobe, measuring up to 2.4 cm on image 65/4. There is mild patchy airspace disease surrounding these cavitary infiltrates. Upper abdomen: Small calcified gallstones. No evidence of biliary dilatation or adrenal  mass. Postsurgical changes in the anterior abdominal wall consistent with previous hernia repair. Musculoskeletal/Chest wall: There is no chest wall mass or suspicious osseous finding. There are old fractures of the right 5th and 6th ribs posterolaterally which appear unchanged. IMPRESSION: 1. CT confirms the presence of a cavitary infiltrate in the right upper lobe and shows a smaller area of cavitation anteriorly in the left upper lobe. No abnormalities were present in these areas on CT performed 2 months ago to suggest cavitary neoplasm, and these findings are consistent with cavitating/necrotizing pneumonia. 2. Mildly enlarged mediastinal and hilar lymph nodes, likely reactive. 3. No associated pleural disease. 4. Cholelithiasis. 5. Underlying moderate Emphysema (ICD10-J43.9). 6. Coronary and Aortic Atherosclerosis (ICD10-I70.0). Electronically Signed   By: Richardean Sale M.D.   On: 08/22/2020 14:26   US Venous Img Lower Unilateral Left (DVT)  Result Date: 07/24/2020 CLINICAL DATA:  Left lower extremity edema. EXAM: LEFT LOWER EXTREMITY VENOUS DOPPLER ULTRASOUND TECHNIQUE: Gray-scale sonography with graded compression, as well as color Doppler and duplex ultrasound were performed to evaluate the lower extremity deep venous systems from the level of the common femoral vein and including the common femoral, femoral, profunda femoral, popliteal and calf veins including the posterior tibial, peroneal and gastrocnemius veins when visible. The superficial great saphenous vein was also interrogated. Spectral Doppler was utilized to evaluate flow at rest and with distal augmentation maneuvers in the common femoral, femoral and popliteal veins. COMPARISON:  None. FINDINGS: Contralateral Common Femoral Vein: Respiratory phasicity is normal and symmetric with the symptomatic side. No evidence of thrombus. Normal compressibility. Common Femoral Vein: No evidence of thrombus. Normal compressibility, respiratory  phasicity and response to augmentation. Saphenofemoral Junction: No evidence of thrombus. Normal compressibility and flow on color Doppler imaging. Profunda Femoral Vein: No evidence of thrombus. Normal compressibility and flow on color Doppler imaging. Femoral Vein: No evidence of thrombus. Normal compressibility, respiratory phasicity and response to augmentation. Popliteal Vein: No evidence of thrombus. Normal compressibility, respiratory phasicity and response to augmentation. Calf Veins: No evidence of thrombus. Normal compressibility and flow on color Doppler imaging. Superficial Great Saphenous Vein: No evidence of thrombus. Normal compressibility. Venous Reflux:  None. Other Findings: No evidence of superficial thrombophlebitis or abnormal fluid collection. IMPRESSION: No evidence of left lower extremity deep venous thrombosis. Electronically Signed   By: Aletta Edouard M.D.   On: 07/24/2020 16:08   DG Chest Portable 1 View  Result Date: 08/22/2020 CLINICAL DATA:  Shortness of breath EXAM: PORTABLE CHEST 1 VIEW COMPARISON:  August 04, 2020 FINDINGS: There is apparent scarring in the right perihilar region. There is suggestion of cavitation in this area in the right perihilar region. Slight scarring lateral right base. The lungs elsewhere are clear. Heart size and pulmonary vascularity are normal. No appreciable  adenopathy. No bone lesions. IMPRESSION: Scarring right mid lung. Suggestion of developing cavitation in this area which could be due to overlapping of areas of scarring and normal structures. Given concern for cavitation in this area, noncontrast chest CT advised to further evaluate. Slight scarring lateral right base. Lungs elsewhere are clear. Heart size normal. No adenopathy evident by radiography. Electronically Signed   By: Lowella Grip III M.D.   On: 08/22/2020 13:24   Portable chest 1 View  Result Date: 08/04/2020 CLINICAL DATA:  Pneumonia. EXAM: PORTABLE CHEST 1 VIEW COMPARISON:   08/03/2020 and 06/29/2020 FINDINGS: Chronic coarse lung markings. No new airspace disease. Heart size is stable. Trachea is midline. Old right rib fractures. Negative for a pneumothorax. IMPRESSION: Chronic lung changes without acute findings. Electronically Signed   By: Markus Daft M.D.   On: 08/04/2020 12:24   DG Chest Port 1 View  Result Date: 08/03/2020 CLINICAL DATA:  Shortness of breath and low-grade fever with possible sepsis. EXAM: PORTABLE CHEST 1 VIEW COMPARISON:  June 29, 2020 FINDINGS: The lungs are hyperinflated. Diffuse, chronic appearing increased interstitial lung markings are noted. There is no evidence of acute infiltrate, pleural effusion or pneumothorax. The heart size and mediastinal contours are within normal limits. Chronic sixth and seventh right rib fractures are seen. IMPRESSION: COPD without acute or active cardiopulmonary disease. Electronically Signed   By: Virgina Norfolk M.D.   On: 08/03/2020 19:24      ASSESSMENT/PLAN   This is an un kept male, poor nutrition here with abnormal chest ct: bilateral apical cavitary like findings which looks more pneumonia , early abcess or infected apical bullae. Agree this does  not look like cancer due to the rapid change from 1-2 months X-rays. Today confused, suspect underlying dementia. Suggestions: -zosyn, vanco serologies pending  -Pulmonary toilet -quantiferon tb gold -sputum c/s, blood cultures pending  -nutrition supplement -social serrvice eval regarding his home situation -hold on bronch for now -dvt prophylaxis  Copd -agree with your present coverage  Mild hyponatremia ( d/dx: nutritional, pulm dz/pneumonia)  -as per above  Pancytopenia, chronic      Thank you for allowing me to participate in the care of this patient.   Patient/Family are satisfied with care plan and all questions have been answered.  This document was prepared using Dragon voice recognition software and may include unintentional  dictation errors.     Wallene Huh, M.D.  Division of [facility]

## 2020-08-24 DIAGNOSIS — J9621 Acute and chronic respiratory failure with hypoxia: Secondary | ICD-10-CM | POA: Diagnosis not present

## 2020-08-24 DIAGNOSIS — J189 Pneumonia, unspecified organism: Secondary | ICD-10-CM | POA: Diagnosis not present

## 2020-08-24 DIAGNOSIS — M0500 Felty's syndrome, unspecified site: Secondary | ICD-10-CM | POA: Diagnosis not present

## 2020-08-24 DIAGNOSIS — T68XXXA Hypothermia, initial encounter: Secondary | ICD-10-CM

## 2020-08-24 DIAGNOSIS — I5032 Chronic diastolic (congestive) heart failure: Secondary | ICD-10-CM | POA: Diagnosis not present

## 2020-08-24 LAB — EXPECTORATED SPUTUM ASSESSMENT W GRAM STAIN, RFLX TO RESP C

## 2020-08-24 LAB — HIV ANTIBODY (ROUTINE TESTING W REFLEX): HIV Screen 4th Generation wRfx: NONREACTIVE

## 2020-08-24 LAB — MRSA PCR SCREENING: MRSA by PCR: POSITIVE — AB

## 2020-08-24 LAB — TSH: TSH: 1.247 u[IU]/mL (ref 0.350–4.500)

## 2020-08-24 LAB — CREATININE, SERUM
Creatinine, Ser: 0.51 mg/dL — ABNORMAL LOW (ref 0.61–1.24)
GFR, Estimated: >60 mL/min (ref >60)

## 2020-08-24 MED ORDER — CHLORHEXIDINE GLUCONATE CLOTH 2 % EX PADS
6.0000 | MEDICATED_PAD | Freq: Every day | CUTANEOUS | Status: AC
  Administered 2020-08-24 – 2020-08-28 (×5): 6 via TOPICAL

## 2020-08-24 MED ORDER — PREDNISONE 20 MG PO TABS
10.0000 mg | ORAL_TABLET | Freq: Two times a day (BID) | ORAL | Status: DC
  Administered 2020-08-24 – 2020-08-30 (×12): 10 mg via ORAL
  Filled 2020-08-24 (×12): qty 1

## 2020-08-24 MED ORDER — MUPIROCIN 2 % EX OINT
1.0000 "application " | TOPICAL_OINTMENT | Freq: Two times a day (BID) | CUTANEOUS | Status: AC
  Administered 2020-08-24 – 2020-08-28 (×9): 1 via NASAL
  Filled 2020-08-24 (×2): qty 22

## 2020-08-24 NOTE — Progress Notes (Signed)
Patient arrived to the floor from the ED with rectal temp of 90.5F. Warming blanket ordered and put on. Patient took off bear hugger and all blankets around 30 minutes after. Patient educated about the importance but still refused. Rectal temperature taken again and was 90.69F. NP notified.

## 2020-08-24 NOTE — Progress Notes (Signed)
OT Cancellation Note  Patient Details Name: Marcus [name] MRN: [id number] DOB: 07-11-1960   Cancelled Treatment:    Reason Eval/Treat Not Completed: Medical issues which prohibited therapy. Order received and chart reviewed. Pt noted with temperature of 91.7 (Per RN refusing warming). Primary RN states pt generally unwell today. Will hold OT tx at this time and re-attempt at a later date/time as available and pt medically appropriate for OT evaluation.   Shara Blazing, M.S., OTR/L Ascom: [phone] 08/24/20, 2:00 PM

## 2020-08-24 NOTE — Progress Notes (Signed)
PROGRESS NOTE    Marcus [name]  OVF:[id number] DOB: [birth year] DOA: 08/22/2020 PCP: Marcus Clarks, FNP   Chief complaint.  Shortness of breath. Brief Narrative:  Marcus [name] is a 60-year-male with history of COPD, chronic hypoxemic respiratoryfailure on 2 L oxygen, rheumatoid arthritis, pancytopenia, Felty syndrome who came to the hospital with complaints of short of breath and a cough for at least a month. Upon arriving the emergency room, his CT scan showed cavitary lesion in the right upper lobe.  Suspect lung abscess, he is seen by pulmonology, placed on Zosyn, also added vancomycin.   Assessment & Plan:   Active Problems:   Moderate protein-calorie malnutrition (HCC)   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HCC)   Chronic obstructive pulmonary disease (HCC)   Chronic diastolic CHF (congestive heart failure) (HCC)   Hyponatremia   Cavitary pneumonia   Acute on chronic respiratory failure with hypoxia ([place])  #1.  Left upper lobe cavitary pneumonia. Blood cultures so far negative, sputum culture still pending.  Acid-fast stain pending.  MRSA culture positive. We will continue vancomycin and Zosyn for now. If sputum culture came back negative for MRSA, will treat with oral Augmentin in addition to doxycycline.  #2.  Hypothermia. Patient temperature is low today, he is already taking steroids for rheumatoid arthritis.  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  Due to acute illness, I will temporarily increased prednisone dose to 10 mg twice a day.  We will check a TSH level to rule out thyroidism. Patient currently does not have sepsis.  3.  Acute on chronic hypoxemic respiratory failure. COPD. Patient does not have exacerbation of COPD.  We will continue wean down oxygen to 2 L.  #3.  Rheumatoid arthritis. Pancytopenia. Felty syndrome. Oral steroids.  4.  Likely dementia. Follow-up with PCP as outpatient.  #5.   Hypokalemia Improved.    DVT prophylaxis: Lovenox Code Status: Full Family Communication:  Disposition Plan:  .   Status is: Inpatient  Remains inpatient appropriate because:Inpatient level of care appropriate due to severity of illness   Dispo: The patient is from: Home              Anticipated d/c is to: Home              Patient currently is not medically stable to d/c.   Difficult to place patient No        I/O last 3 completed shifts: In: 1353.2 [I.V.:606; IV Piggyback:747.2] Out: 1200 [Urine:1200] Total I/O In: -  Out: 98 [Urine:450]     Consultants:   Pulmonology  Procedures: None  Antimicrobials:  Zosyn and vancomycin  Subjective: Patient temperature has been low today, but the patient does not feel cold, he refused a heating blanket. He is still on 3 L oxygen, still has short of breath with exertion.  Cough, has some white/yellow mucus. Denies any abdominal pain, no nausea vomiting or diarrhea. No dysuria or hematuria. No chest pain or palpitation. No headache or dizziness.  Objective: Vitals:   08/24/20 0811 08/24/20 1100 08/24/20 1128 08/24/20 1153  BP: 103/63  100/63 98/60  Pulse: 66  (!) 57 64  Resp: 18  20 18   Temp: (!) 91.4 F (33 C) (!) 91.7 F (33.2 C) (!) 91.7 F (33.2 C)   TempSrc: Rectal Rectal Rectal   SpO2: 95%  96% 94%  Weight:      Height:        Intake/Output Summary (Last 24 hours) at  08/24/2020 1238 Last data filed at 08/24/2020 0900 Gross per 24 hour  Intake 302.34 ml  Output 850 ml  Net -547.66 ml   Filed Weights   08/22/20 1220 08/24/20 0235  Weight: 57.1 kg 60.6 kg    Examination:  General exam: Appears calm and comfortable  Respiratory system: Clear to auscultation. Respiratory effort normal. Cardiovascular system: S1 & S2 heard, RRR. No JVD, murmurs, rubs, gallops or clicks. No pedal edema. Gastrointestinal system: Abdomen is nondistended, soft and nontender. No organomegaly or masses felt. Normal  bowel sounds heard. Central nervous system: Alert and oriented x2. No focal neurological deficits. Extremities: Symmetric 5 x 5 power. Skin: No rashes, lesions or ulcers Psychiatry:  Mood & affect appropriate.     Data Reviewed: I have personally reviewed following labs and imaging studies  CBC: Recent Labs  Lab 08/22/20 1229  WBC 3.1*  HGB 13.9  HCT 43.2  MCV 84.5  PLT [id number]*   Basic Metabolic Panel: Recent Labs  Lab 08/22/20 1229 08/24/20 0801  NA 132*  --   K 4.1  --   CL 98  --   CO2 25  --   GLUCOSE 169*  --   BUN 27*  --   CREATININE 0.79 0.51*  CALCIUM 8.0*  --    GFR: Estimated Creatinine Clearance: 85.2 mL/min (A) (by C-G formula based on SCr of 0.51 mg/dL (L)). Liver Function Tests: Recent Labs  Lab 08/22/20 1229  AST 37  ALT 18  ALKPHOS 116  BILITOT 1.1  PROT 6.6  ALBUMIN 2.0*   No results for input(s): LIPASE, AMYLASE in the last 168 hours. No results for input(s): AMMONIA in the last 168 hours. Coagulation Profile: No results for input(s): INR, PROTIME in the last 168 hours. Cardiac Enzymes: No results for input(s): CKTOTAL, CKMB, CKMBINDEX, TROPONINI in the last 168 hours. BNP (last 3 results) No results for input(s): PROBNP in the last 8760 hours. HbA1C: No results for input(s): HGBA1C in the last 72 hours. CBG: No results for input(s): GLUCAP in the last 168 hours. Lipid Profile: No results for input(s): CHOL, HDL, LDLCALC, TRIG, CHOLHDL, LDLDIRECT in the last 72 hours. Thyroid Function Tests: Recent Labs    08/24/20 0439  TSH 1.247   Anemia Panel: No results for input(s): VITAMINB12, FOLATE, FERRITIN, TIBC, IRON, RETICCTPCT in the last 72 hours. Sepsis Labs: No results for input(s): PROCALCITON, LATICACIDVEN in the last 168 hours.  Recent Results (from the past 240 hour(s))  Resp Panel by RT-PCR (Flu A&B, Covid) Nasopharyngeal Swab     Status: None   Collection Time: 08/22/20 12:45 PM   Specimen: Nasopharyngeal Swab;  Nasopharyngeal(NP) swabs in vial transport medium  Result Value Ref Range Status   SARS Coronavirus 2 by RT PCR NEGATIVE NEGATIVE Final    Comment: (NOTE) SARS-CoV-2 target nucleic acids are NOT DETECTED.  The SARS-CoV-2 RNA is generally detectable in upper respiratory specimens during the acute phase of infection. The lowest concentration of SARS-CoV-2 viral copies this assay can detect is 138 copies/mL. A negative result does not preclude SARS-Cov-2 infection and should not be used as the sole basis for treatment or other patient management decisions. A negative result may occur with  improper specimen collection/handling, submission of specimen other than nasopharyngeal swab, presence of viral mutation(s) within the areas targeted by this assay, and inadequate number of viral copies(<138 copies/mL). A negative result must be combined with clinical observations, patient history, and epidemiological information. The expected result is Negative.  Fact Sheet  for Patients:  EntrepreneurPulse.com.au  Fact Sheet for Healthcare Providers:  IncredibleEmployment.be  This test is no t yet approved or cleared by the [place] FDA and  has been authorized for detection and/or diagnosis of SARS-CoV-2 by FDA under an Emergency Use Authorization (EUA). This EUA will remain  in effect (meaning this test can be used) for the duration of the COVID-19 declaration under Section 564(b)(1) of the Act, 21 U.S.C.section 360bbb-3(b)(1), unless the authorization is terminated  or revoked sooner.       Influenza A by PCR NEGATIVE NEGATIVE Final   Influenza B by PCR NEGATIVE NEGATIVE Final    Comment: (NOTE) The Xpert Xpress SARS-CoV-2/FLU/RSV plus assay is intended as an aid in the diagnosis of influenza from Nasopharyngeal swab specimens and should not be used as a sole basis for treatment. Nasal washings and aspirates are unacceptable for Xpert Xpress  SARS-CoV-2/FLU/RSV testing.  Fact Sheet for Patients: EntrepreneurPulse.com.au  Fact Sheet for Healthcare Providers: IncredibleEmployment.be  This test is not yet approved or cleared by the [place] FDA and has been authorized for detection and/or diagnosis of SARS-CoV-2 by FDA under an Emergency Use Authorization (EUA). This EUA will remain in effect (meaning this test can be used) for the duration of the COVID-19 declaration under Section 564(b)(1) of the Act, 21 U.S.C. section 360bbb-3(b)(1), unless the authorization is terminated or revoked.  Performed at [facility], [address] [place], [place] [address]   Blood culture (routine x 2)     Status: None (Preliminary result)   Collection Time: 08/22/20  3:18 PM   Specimen: BLOOD  Result Value Ref Range Status   Specimen Description BLOOD RIGHT ANTECUBITAL  Final   Special Requests   Final    BOTTLES DRAWN AEROBIC AND ANAEROBIC Blood Culture adequate volume   Culture   Final    NO GROWTH 2 DAYS Performed at [facility], [address], [place], [place] [address]    Report Status PENDING  Incomplete  Blood culture (routine x 2)     Status: None (Preliminary result)   Collection Time: 08/22/20  3:18 PM   Specimen: BLOOD  Result Value Ref Range Status   Specimen Description BLOOD LEFT ANTECUBITAL  Final   Special Requests   Final    BOTTLES DRAWN AEROBIC AND ANAEROBIC Blood Culture adequate volume   Culture   Final    NO GROWTH 2 DAYS Performed at [facility], [address] [place], [place] [address]    Report Status PENDING  Incomplete  Expectorated Sputum Assessment w Gram Stain, Rflx to Resp Cult     Status: None   Collection Time: 08/23/20  4:15 AM   Specimen: Expectorated Sputum  Result Value Ref Range Status   Specimen Description EXPECTORATED SPUTUM  Final   Special Requests NONE  Final   Sputum evaluation   Final    THIS SPECIMEN IS  ACCEPTABLE FOR SPUTUM CULTURE Performed at [facility], [address] [place], [place] [address]    Report Status 08/24/2020 FINAL  Final  MRSA PCR Screening     Status: Abnormal   Collection Time: 08/24/20  3:15 AM   Specimen: Nasopharyngeal  Result Value Ref Range Status   MRSA by PCR POSITIVE (A) NEGATIVE Final    Comment:        The GeneXpert MRSA Assay (FDA approved for NASAL specimens only), is one component of a comprehensive MRSA colonization surveillance program. It is not intended to diagnose MRSA  infection nor to guide or monitor treatment for MRSA infections. RESULT CALLED TO, READ BACK BY AND VERIFIED WITH: MIA YOUNG AT 0530 ON 08/24/2020 [place]. Performed at [facility], [address], [place], [place] [address]          Radiology Studies: CT Chest Wo Contrast  Result Date: 08/22/2020 CLINICAL DATA:  Abnormal x-ray. Interstitial infiltrates with possible right perihilar cavitation. Cough and shortness of breath. EXAM: CT CHEST WITHOUT CONTRAST TECHNIQUE: Multidetector CT imaging of the chest was performed following the standard protocol without IV contrast. COMPARISON:  Radiographs 08/22/2020 and 08/04/2020.  CT 06/10/2020. FINDINGS: Cardiovascular: Extensive three-vessel coronary artery atherosclerosis with lesser involvement of the aorta and great vessels. The heart size is normal. There is no pericardial effusion. Mediastinum/Nodes: Multiple small mediastinal and hilar lymph nodes have enlarged compared with the chest CT of 2 months ago, likely reactive. No axillary lymphadenopathy. The thyroid gland, trachea and esophagus demonstrate no significant findings. Lungs/Pleura: There is no pleural effusion or pneumothorax. Moderate centrilobular and paraseptal emphysema. As seen on the earlier radiographs, there is a new cavitary mass inferomedially in the right upper lobe, measuring up to 7.8 x 5.5 cm on image 79/4. This has irregular thick walls and  associated air-fluid levels. There was no abnormality in this area on the relatively recent prior chest CT, and this is most consistent with cavitating pneumonia. There is an additional cavitary lesion with an air-fluid level medially in the left upper lobe, measuring up to 2.4 cm on image 65/4. There is mild patchy airspace disease surrounding these cavitary infiltrates. Upper abdomen: Small calcified gallstones. No evidence of biliary dilatation or adrenal mass. Postsurgical changes in the anterior abdominal wall consistent with previous hernia repair. Musculoskeletal/Chest wall: There is no chest wall mass or suspicious osseous finding. There are old fractures of the right 5th and 6th ribs posterolaterally which appear unchanged. IMPRESSION: 1. CT confirms the presence of a cavitary infiltrate in the right upper lobe and shows a smaller area of cavitation anteriorly in the left upper lobe. No abnormalities were present in these areas on CT performed 2 months ago to suggest cavitary neoplasm, and these findings are consistent with cavitating/necrotizing pneumonia. 2. Mildly enlarged mediastinal and hilar lymph nodes, likely reactive. 3. No associated pleural disease. 4. Cholelithiasis. 5. Underlying moderate Emphysema (ICD10-J43.9). 6. Coronary and Aortic Atherosclerosis (ICD10-I70.0). Electronically Signed   By: Richardean Sale M.D.   On: 08/22/2020 14:26   DG Chest Portable 1 View  Result Date: 08/22/2020 CLINICAL DATA:  Shortness of breath EXAM: PORTABLE CHEST 1 VIEW COMPARISON:  August 04, 2020 FINDINGS: There is apparent scarring in the right perihilar region. There is suggestion of cavitation in this area in the right perihilar region. Slight scarring lateral right base. The lungs elsewhere are clear. Heart size and pulmonary vascularity are normal. No appreciable adenopathy. No bone lesions. IMPRESSION: Scarring right mid lung. Suggestion of developing cavitation in this area which could be due to  overlapping of areas of scarring and normal structures. Given concern for cavitation in this area, noncontrast chest CT advised to further evaluate. Slight scarring lateral right base. Lungs elsewhere are clear. Heart size normal. No adenopathy evident by radiography. Electronically Signed   By: Lowella Grip III M.D.   On: 08/22/2020 13:24        Scheduled Meds: . Chlorhexidine Gluconate Cloth  6 each Topical Q0600  . enoxaparin (LOVENOX) injection  40 mg Subcutaneous Q24H  . feeding supplement  237 mL Oral BID  BM  . mometasone-formoterol  2 puff Inhalation BID  . mupirocin ointment  1 application Nasal BID  . pantoprazole  40 mg Oral Daily  . predniSONE  10 mg Oral Q breakfast   Continuous Infusions: . piperacillin-tazobactam (ZOSYN)  IV 3.375 g (08/24/20 0526)  . vancomycin 750 mg (08/24/20 1214)     LOS: 2 days    Time spent: 32 minutes    Sharen Hones, MD [facility]   To contact the attending provider between 7A-7P or the covering provider during after hours 7P-7A, please log into the web site [email or web address] and access using universal [place] password for that web site. If you do not have the password, please call the hospital operator.  08/24/2020, 12:38 PM

## 2020-08-24 NOTE — Consult Note (Signed)
Pharmacy Antibiotic Note  Marcus [name] is a 60 y.o. male admitted on 08/22/2020 with pumonary abscess.   Pharmacy has been consulted for vancomycin dosing.  -also on Zosyn  Plan: -continue Vancomycin 750 mg IV Q 12 hrs.  Goal AUC 400-550. Expected AUC: 513 SCr used: 0.8  MRSA PCR +. Quantiferon Gold pending. F/u Scr   Height: 5\' 6"  (167.6 cm) Weight: 60.6 kg (133 lb 9.6 oz) IBW/kg (Calculated) : 63.8  Temp (24hrs), Avg:91 F (32.8 C), Min:90.1 F (32.3 C), Max:91.7 F (33.2 C)  Recent Labs  Lab 08/22/20 1229 08/24/20 0801  WBC 3.1*  --   CREATININE 0.79 0.51*    Estimated Creatinine Clearance: 85.2 mL/min (A) (by C-G formula based on SCr of 0.51 mg/dL (L)).    Allergies  Allergen Reactions  . Levaquin [Levofloxacin In D5w] Other (See Comments)    Chest pain  . Methotrexate Other (See Comments)    Chest pain    Antimicrobials this admission: 5/18 Azithromycin/Ceftriaxone x 1 5/18 Zosyn >> 5/19 Vancomycin >>   Dose adjustments this admission: none  Microbiology results: 5/18 BCx: NG x2d  5/19 Sputum: pending  5/19 MRSA culture: + COVID/FLU NEG  Thank you for allowing pharmacy to be a part of this patient's care.  Noralee Space, PharmD Clinical Pharmacist 08/24/2020 11:48 AM

## 2020-08-24 NOTE — Progress Notes (Signed)
Pulmonary Medicine     HISTORY OF PRESENT ILLNESS   Hypothermic, B/P the same, low normal,  Dyspnea no worse. No focal symptoms.   Here last month with weakness, phx of rheumatoid arthritis, pancytopenia, COPD, Felty syndrome, chronic hyponatremia,  protein malnutrition, decondition, poor oral hygiene. He came in c/o increasing shortness of breath. Also weaker. Poor po intake. Apical infection vs developing abscess. On zosyn and vanco .    PAST MEDICAL HISTORY   Past Medical History:  Diagnosis Date  . COPD (chronic obstructive pulmonary disease) ([place])   . 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 Hernia, epigastric   . Pancytopenia ([place])   . Seropositive rheumatoid arthritis ([place])   . Tobacco use disorder      SURGICAL HISTORY   Past Surgical History:  Procedure Laterality Date  . INCISIONAL HERNIA REPAIR  01/17/2020   Procedure: HERNIA REPAIR INCISIONAL AND Silvestre Gunner;  Surgeon: Kinsinger, Arta Bruce, MD;  Location: [facility];  Service: General;;  . LAPAROTOMY N/A 01/17/2020   Procedure: EXPLORATORY LAPAROTOMY;  Surgeon: Kieth Brightly Arta Bruce, MD;  Location: [facility];  Service: General;  Laterality: N/A;     FAMILY HISTORY   Family History  Problem Relation Age of Onset  . CAD Brother   . COPD Brother      SOCIAL HISTORY   Social History   Tobacco Use  . Smoking status: Current Every Day Smoker    Packs/day: 1.00    Types: Cigarettes  . Smokeless tobacco: Never Used  Substance Use Topics  . Alcohol use: No    Alcohol/week: 0.0 standard drinks  . Drug use: Yes    Types: Marijuana     MEDICATIONS    Home Medication:    Current Medication:  Current Facility-Administered Medications:  .  albuterol (PROVENTIL) (2.5 MG/3ML) 0.083% nebulizer solution 2.5 mg, 2.5 mg, Nebulization, Q6H PRN, Sharen Hones, MD, 2.5 mg at 08/22/20 2102 .  Chlorhexidine Gluconate Cloth 2 % PADS 6 each, 6 each, Topical, Q0600, Sharion Settler, NP, 6 each at 08/24/20 0554 .  enoxaparin  (LOVENOX) injection 40 mg, 40 mg, Subcutaneous, Q24H, Sharen Hones, MD, 40 mg at 08/23/20 2334 .  feeding supplement (ENSURE ENLIVE / ENSURE PLUS) liquid 237 mL, 237 mL, Oral, BID BM, Sharen Hones, MD, 237 mL at 08/24/20 1010 .  mometasone-formoterol (DULERA) 100-5 MCG/ACT inhaler 2 puff, 2 puff, Inhalation, BID, Sharen Hones, MD, 2 puff at 08/23/20 2336 .  mupirocin ointment (BACTROBAN) 2 % 1 application, 1 application, Nasal, BID, Sharion Settler, NP, 1 application at [id number] .  pantoprazole (PROTONIX) EC tablet 40 mg, 40 mg, Oral, Daily, Sharen Hones, MD, 40 mg at 08/24/20 1122 .  piperacillin-tazobactam (ZOSYN) IVPB 3.375 g, 3.375 g, Intravenous, Q8H, Sharen Hones, MD, Last Rate: 12.5 mL/hr at 08/24/20 0526, 3.375 g at 08/24/20 0526 .  predniSONE (DELTASONE) tablet 10 mg, 10 mg, Oral, Q breakfast, Sharen Hones, MD, 10 mg at 08/24/20 1122 .  [COMPLETED] vancomycin (VANCOCIN) IVPB 1000 mg/200 mL premix, 1,000 mg, Intravenous, Once, Stopped at 08/23/20 1230 **FOLLOWED BY** vancomycin (VANCOREADY) IVPB 750 mg/150 mL, 750 mg, Intravenous, Q12H, Sharen Hones, MD, Last Rate: 150 mL/hr at 08/24/20 1214, 750 mg at 08/24/20 1214    ALLERGIES   Levaquin [levofloxacin in d5w] and Methotrexate     REVIEW OF SYSTEMS    Review of Systems:  Gen:  Denies  fever, sweats, chills weigh loss  HEENT: Denies blurred vision, double vision, ear pain, eye pain, hearing loss, nose  bleeds, sore throat Cardiac:  No dizziness, chest pain or heaviness, chest tightness,edema Resp:   Denies cough or sputum porduction, ++ shortness of breath,wheezing, hemoptysis,  Gi: Denies swallowing difficulty, stomach pain, nausea or vomiting, diarrhea, constipation, bowel incontinence Gu:  Denies bladder incontinence, burning urine Ext:   Denies Joint pain, stiffness or swelling Skin: Denies  skin rash, easy bruising or bleeding or hives Endoc:  Denies polyuria, polydipsia , polyphagia or weight change Psych:   Denies  depression, insomnia or hallucinations   Other:  All other systems negative   VS: BP 98/60 (BP Location: Left Arm)   Pulse 64   Temp (!) 91.7 F (33.2 C) (Rectal)   Resp 18   Ht 5\' 6"  (1.676 m)   Wt 60.6 kg   SpO2 94%   BMI 21.56 kg/m      PHYSICAL EXAM    GENERAL:NAD, no fevers, chills, no weakness no fatigue, frial, thin HEAD: Normocephalic, atraumatic.  EYES: Pupils equal, round, reactive to light. Extraocular muscles intact. No scleral icterus.  MOUTH: Moist mucosal membrane. Dentition intact. No abscess noted.  EAR, NOSE, THROAT: Clear without exudates. No external lesions.  NECK: Supple. No thyromegaly. No nodules. No JVD.  PULMONARY: no wheezes or rales CARDIOVASCULAR: S1 and S2. Regular rate and rhythm. No murmurs, rubs, or gallops. No edema. Pedal pulses 2+ bilaterally.  GASTROINTESTINAL: Soft, nontender, nondistended. No masses. Positive bowel sounds. No hepatosplenomegaly.  MUSCULOSKELETAL: No swelling, clubbing, or edema. Range of motion full in all extremities.  NEUROLOGIC: Cranial nerves II through XII are intact. No gross focal neurological deficits. Sensation intact. Reflexes intact.  SKIN: No ulceration, lesions, rashes, or cyanosis. Skin warm and dry. Turgor intact.  PSYCHIATRIC: Mood, affect within normal limits. The patient is awake, alert and oriented x 3. Insight, judgment intact.       IMAGING    CT Chest Wo Contrast  Result Date: 08/22/2020 CLINICAL DATA:  Abnormal x-ray. Interstitial infiltrates with possible right perihilar cavitation. Cough and shortness of breath. EXAM: CT CHEST WITHOUT CONTRAST TECHNIQUE: Multidetector CT imaging of the chest was performed following the standard protocol without IV contrast. COMPARISON:  Radiographs 08/22/2020 and 08/04/2020.  CT 06/10/2020. FINDINGS: Cardiovascular: Extensive three-vessel coronary artery atherosclerosis with lesser involvement of the aorta and great vessels. The heart size is normal. There is  no pericardial effusion. Mediastinum/Nodes: Multiple small mediastinal and hilar lymph nodes have enlarged compared with the chest CT of 2 months ago, likely reactive. No axillary lymphadenopathy. The thyroid gland, trachea and esophagus demonstrate no significant findings. Lungs/Pleura: There is no pleural effusion or pneumothorax. Moderate centrilobular and paraseptal emphysema. As seen on the earlier radiographs, there is a new cavitary mass inferomedially in the right upper lobe, measuring up to 7.8 x 5.5 cm on image 79/4. This has irregular thick walls and associated air-fluid levels. There was no abnormality in this area on the relatively recent prior chest CT, and this is most consistent with cavitating pneumonia. There is an additional cavitary lesion with an air-fluid level medially in the left upper lobe, measuring up to 2.4 cm on image 65/4. There is mild patchy airspace disease surrounding these cavitary infiltrates. Upper abdomen: Small calcified gallstones. No evidence of biliary dilatation or adrenal mass. Postsurgical changes in the anterior abdominal wall consistent with previous hernia repair. Musculoskeletal/Chest wall: There is no chest wall mass or suspicious osseous finding. There are old fractures of the right 5th and 6th ribs posterolaterally which appear unchanged. IMPRESSION: 1. CT confirms the presence of  a cavitary infiltrate in the right upper lobe and shows a smaller area of cavitation anteriorly in the left upper lobe. No abnormalities were present in these areas on CT performed 2 months ago to suggest cavitary neoplasm, and these findings are consistent with cavitating/necrotizing pneumonia. 2. Mildly enlarged mediastinal and hilar lymph nodes, likely reactive. 3. No associated pleural disease. 4. Cholelithiasis. 5. Underlying moderate Emphysema (ICD10-J43.9). 6. Coronary and Aortic Atherosclerosis (ICD10-I70.0). Electronically Signed   By: Richardean Sale M.D.   On: 08/22/2020 14:26    DG Chest Portable 1 View  Result Date: 08/22/2020 CLINICAL DATA:  Shortness of breath EXAM: PORTABLE CHEST 1 VIEW COMPARISON:  August 04, 2020 FINDINGS: There is apparent scarring in the right perihilar region. There is suggestion of cavitation in this area in the right perihilar region. Slight scarring lateral right base. The lungs elsewhere are clear. Heart size and pulmonary vascularity are normal. No appreciable adenopathy. No bone lesions. IMPRESSION: Scarring right mid lung. Suggestion of developing cavitation in this area which could be due to overlapping of areas of scarring and normal structures. Given concern for cavitation in this area, noncontrast chest CT advised to further evaluate. Slight scarring lateral right base. Lungs elsewhere are clear. Heart size normal. No adenopathy evident by radiography. Electronically Signed   By: Lowella Grip III M.D.   On: 08/22/2020 13:24   Portable chest 1 View  Result Date: 08/04/2020 CLINICAL DATA:  Pneumonia. EXAM: PORTABLE CHEST 1 VIEW COMPARISON:  08/03/2020 and 06/29/2020 FINDINGS: Chronic coarse lung markings. No new airspace disease. Heart size is stable. Trachea is midline. Old right rib fractures. Negative for a pneumothorax. IMPRESSION: Chronic lung changes without acute findings. Electronically Signed   By: Markus Daft M.D.   On: 08/04/2020 12:24   DG Chest Port 1 View  Result Date: 08/03/2020 CLINICAL DATA:  Shortness of breath and low-grade fever with possible sepsis. EXAM: PORTABLE CHEST 1 VIEW COMPARISON:  June 29, 2020 FINDINGS: The lungs are hyperinflated. Diffuse, chronic appearing increased interstitial lung markings are noted. There is no evidence of acute infiltrate, pleural effusion or pneumothorax. The heart size and mediastinal contours are within normal limits. Chronic sixth and seventh right rib fractures are seen. IMPRESSION: COPD without acute or active cardiopulmonary disease. Electronically Signed   By: Virgina Norfolk M.D.   On: 08/03/2020 19:24     hiv negative  ASSESSMENT/PLAN   This is an un kept male, poor nutrition here with abnormal chest ct: bilateral apical cavitary like findings which looks more pneumonia , early abcess or infected apical bullae. Agree this does  not look like cancer due to the rapid change from 1-2 months X-rays. hiv negative Suggestions: -Would cover anaerobes and MRSA positivity ( zosyn, vanco would be good)  -Pulmonary toilet --nutrition supplement -social serrvice eval regarding his home situation -hold on bronch for now -dvt prophylaxis  Hypothermia ? Due to malnutrition, ? Sepsis -warming technique ( external warming) -consider broadening antibiotic coverage to cover psuedomonas  Copd -agree with your present coverage  Mild hyponatremia ( d/dx: nutritional, pulm dz/pneumonia)  -as per above        Thank you for allowing me to participate in the care of this patient.   Patient/Family are satisfied with care plan and all questions have been answered.  This document was prepared using Dragon voice recognition software and may include unintentional dictation errors.     Wallene Huh, M.D.  Division of Pulmonary & [address] -  [place]

## 2020-08-24 NOTE — Progress Notes (Signed)
PT Cancellation Note  Patient Details Name: Marcus [name] MRN: [id number] DOB: September 10, 1960   Cancelled Treatment:    Reason Eval/Treat Not Completed: Medical issues which prohibited therapy: Per chart review, pt with temperature of 91.7 (Per RN refusing warming). Primary RN states pt generally unwell today. Will hold PT eval at this time and re-attempt at a later date/time as medically appropriate.    [name] PT, DPT 08/24/20, 2:04 PM

## 2020-08-24 NOTE — Progress Notes (Signed)
Patient continues to refuse bair hugger stating it is "too hot." Oriented x2, Dr. Roosevelt Locks notified- requested RN recheck temp in different site in one hour. At this time, temperature will only read rectally; will reattempt at 0930.     08/24/20 0811  Assess: MEWS Score  Temp (!) 91.4 F (33 C)  BP 103/63  Pulse Rate 66  Resp 18  Level of Consciousness Alert  SpO2 95 %  O2 Device Nasal Cannula  O2 Flow Rate (L/min) 4 L/min  Assess: MEWS Score  MEWS Temp 2  MEWS Systolic 0  MEWS Pulse 0  MEWS RR 0  MEWS LOC 0  MEWS Score 2  MEWS Score Color Yellow  Assess: if the MEWS score is Yellow or Red  Were vital signs taken at a resting state? Yes  Focused Assessment No change from prior assessment  Early Detection of Sepsis Score *See Row Information* Low  MEWS guidelines implemented *See Row Information* No, previously yellow, continue vital signs every 4 hours  Treat  MEWS Interventions Escalated (See documentation below)  Pain Scale 0-10  Pain Score 0  Take Vital Signs  Increase Vital Sign Frequency  Yellow: Q 2hr X 2 then Q 4hr X 2, if remains yellow, continue Q 4hrs  Escalate  MEWS: Escalate Yellow: discuss with charge nurse/RN and consider discussing with provider and RRT  Notify: Charge Nurse/RN  Name of Charge Nurse/RN Notified Claiborne Billings, RN  Date Charge Nurse/RN Notified 08/24/20  Time Charge Nurse/RN Notified [phone]  Notify: Provider  Provider Name/Title Dr. Roosevelt Locks  Date Provider Notified 08/24/20  Time Provider Notified [phone]  Notification Type Page  Notification Reason Other (Comment)  Provider response No new orders  Date of Provider Response 08/24/20  Time of Provider Response [phone]

## 2020-08-25 DIAGNOSIS — M0500 Felty's syndrome, unspecified site: Secondary | ICD-10-CM | POA: Diagnosis not present

## 2020-08-25 DIAGNOSIS — J984 Other disorders of lung: Secondary | ICD-10-CM | POA: Diagnosis not present

## 2020-08-25 DIAGNOSIS — J189 Pneumonia, unspecified organism: Secondary | ICD-10-CM | POA: Diagnosis not present

## 2020-08-25 DIAGNOSIS — J9621 Acute and chronic respiratory failure with hypoxia: Secondary | ICD-10-CM | POA: Diagnosis not present

## 2020-08-25 LAB — CBC WITH DIFFERENTIAL/PLATELET
Abs Immature Granulocytes: 0.01 10*3/uL (ref 0.00–0.07)
Basophils Absolute: 0 10*3/uL (ref 0.0–0.1)
Basophils Relative: 1 %
Eosinophils Absolute: 0 10*3/uL (ref 0.0–0.5)
Eosinophils Relative: 1 %
HCT: 38 % — ABNORMAL LOW (ref 39.0–52.0)
Hemoglobin: 12.2 g/dL — ABNORMAL LOW (ref 13.0–17.0)
Immature Granulocytes: 1 %
Lymphocytes Relative: 38 %
Lymphs Abs: 0.4 10*3/uL — ABNORMAL LOW (ref 0.7–4.0)
MCH: 27.3 pg (ref 26.0–34.0)
MCHC: 32.1 g/dL (ref 30.0–36.0)
MCV: 85 fL (ref 80.0–100.0)
Monocytes Absolute: 0.4 10*3/uL (ref 0.1–1.0)
Monocytes Relative: 38 %
Neutro Abs: 0.2 10*3/uL — CL (ref 1.7–7.7)
Neutrophils Relative %: 21 %
Platelets: 136 10*3/uL — ABNORMAL LOW (ref 150–400)
RBC: 4.47 MIL/uL (ref 4.22–5.81)
RDW: 17.9 % — ABNORMAL HIGH (ref 11.5–15.5)
Smear Review: NORMAL
WBC: 1.1 10*3/uL — CL (ref 4.0–10.5)
nRBC: 1.8 % — ABNORMAL HIGH (ref 0.0–0.2)

## 2020-08-25 LAB — BASIC METABOLIC PANEL
Anion gap: 5 (ref 5–15)
BUN: 13 mg/dL (ref 6–20)
CO2: 28 mmol/L (ref 22–32)
Calcium: 7.8 mg/dL — ABNORMAL LOW (ref 8.9–10.3)
Chloride: 109 mmol/L (ref 98–111)
Creatinine, Ser: 0.45 mg/dL — ABNORMAL LOW (ref 0.61–1.24)
GFR, Estimated: >60 mL/min (ref >60)
Glucose, Bld: 145 mg/dL — ABNORMAL HIGH (ref 70–99)
Potassium: 3.3 mmol/L — ABNORMAL LOW (ref 3.5–5.1)
Sodium: 142 mmol/L (ref 135–145)

## 2020-08-25 LAB — VANCOMYCIN, PEAK: Vancomycin Pk: 20 ug/mL — ABNORMAL LOW (ref 30–40)

## 2020-08-25 LAB — ACID FAST SMEAR (AFB, MYCOBACTERIA): Acid Fast Smear: NEGATIVE

## 2020-08-25 LAB — TSH: TSH: 1.706 u[IU]/mL (ref 0.350–4.500)

## 2020-08-25 LAB — VANCOMYCIN, TROUGH: Vancomycin Tr: 12 ug/mL — ABNORMAL LOW (ref 15–20)

## 2020-08-25 LAB — MAGNESIUM: Magnesium: 2 mg/dL (ref 1.7–2.4)

## 2020-08-25 MED ORDER — POTASSIUM CHLORIDE 20 MEQ PO PACK
40.0000 meq | PACK | Freq: Once | ORAL | Status: AC
  Administered 2020-08-25: 40 meq via ORAL
  Filled 2020-08-25: qty 2

## 2020-08-25 MED ORDER — NICOTINE 21 MG/24HR TD PT24
21.0000 mg | MEDICATED_PATCH | Freq: Every day | TRANSDERMAL | Status: DC
  Administered 2020-08-25 – 2020-08-30 (×6): 21 mg via TRANSDERMAL
  Filled 2020-08-25 (×6): qty 1

## 2020-08-25 MED ORDER — VANCOMYCIN HCL 1000 MG/200ML IV SOLN
1000.0000 mg | Freq: Two times a day (BID) | INTRAVENOUS | Status: DC
  Administered 2020-08-26 – 2020-08-27 (×4): 1000 mg via INTRAVENOUS
  Filled 2020-08-25 (×6): qty 200

## 2020-08-25 NOTE — Progress Notes (Signed)
Pulmonary Medicine  HP[ In bed, family in room, more vocal, answered all their questions. No new complaints. Trying to eat more. hypothermia resolved.b/p stable. No hemoptysis, no pleurisy, no calf pain.    iestions.  i PAST MEDICAL HISTORY   Past Medical History:  Diagnosis Date  . COPD (chronic obstructive pulmonary disease) ([place])   . 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 Hernia, epigastric   . Pancytopenia ([place])   . Seropositive rheumatoid arthritis ([place])   . Tobacco use disorder      SURGICAL HISTORY   Past Surgical History:  Procedure Laterality Date  . INCISIONAL HERNIA REPAIR  01/17/2020   Procedure: HERNIA REPAIR INCISIONAL AND Silvestre Gunner;  Surgeon: Kinsinger, Arta Bruce, MD;  Location: [facility];  Service: General;;  . LAPAROTOMY N/A 01/17/2020   Procedure: EXPLORATORY LAPAROTOMY;  Surgeon: Kieth Brightly Arta Bruce, MD;  Location: [facility];  Service: General;  Laterality: N/A;     FAMILY HISTORY   Family History  Problem Relation Age of Onset  . CAD Brother   . COPD Brother      SOCIAL HISTORY   Social History   Tobacco Use  . Smoking status: Current Every Day Smoker    Packs/day: 1.00    Types: Cigarettes  . Smokeless tobacco: Never Used  Substance Use Topics  . Alcohol use: No    Alcohol/week: 0.0 standard drinks  . Drug use: Yes    Types: Marijuana     MEDICATIONS    Home Medication:    Current Medication:  Current Facility-Administered Medications:  .  albuterol (PROVENTIL) (2.5 MG/3ML) 0.083% nebulizer solution 2.5 mg, 2.5 mg, Nebulization, Q6H PRN, Sharen Hones, MD, 2.5 mg at 08/22/20 2102 .  Chlorhexidine Gluconate Cloth 2 % PADS 6 each, 6 each, Topical, Q0600, Sharion Settler, NP, 6 each at 08/25/20 0520 .  enoxaparin (LOVENOX) injection 40 mg, 40 mg, Subcutaneous, Q24H, Sharen Hones, MD, 40 mg at 08/24/20 2130 .  feeding supplement (ENSURE ENLIVE / ENSURE PLUS) liquid 237 mL, 237 mL, Oral, BID BM, Sharen Hones, MD, 237 mL at 08/25/20 1453 .   mometasone-formoterol (DULERA) 100-5 MCG/ACT inhaler 2 puff, 2 puff, Inhalation, BID, Sharen Hones, MD, 2 puff at 08/25/20 1031 .  mupirocin ointment (BACTROBAN) 2 % 1 application, 1 application, Nasal, BID, Sharion Settler, NP, 1 application at 17/51/02 1032 .  pantoprazole (PROTONIX) EC tablet 40 mg, 40 mg, Oral, Daily, Sharen Hones, MD, 40 mg at 08/25/20 1032 .  piperacillin-tazobactam (ZOSYN) IVPB 3.375 g, 3.375 g, Intravenous, Q8H, Sharen Hones, MD, Last Rate: 12.5 mL/hr at 08/25/20 1454, 3.375 g at 08/25/20 1454 .  predniSONE (DELTASONE) tablet 10 mg, 10 mg, Oral, BID WC, Sharen Hones, MD, 10 mg at 08/25/20 1032 .  [COMPLETED] vancomycin (VANCOCIN) IVPB 1000 mg/200 mL premix, 1,000 mg, Intravenous, Once, Stopped at 08/23/20 1230 **FOLLOWED BY** vancomycin (VANCOREADY) IVPB 750 mg/150 mL, 750 mg, Intravenous, Q12H, Sharen Hones, MD, Last Rate: 150 mL/hr at 08/25/20 1032, 750 mg at 08/25/20 1032    ALLERGIES   Levaquin [levofloxacin in d5w] and Methotrexate     REVIEW OF SYSTEMS    Review of Systems:  Gen:  Denies  fever, sweats, AFFECT BRIGHT, HIS BROTHER A PATIENT OF MINE IS IN THE ROOM HEENT: Denies blurred vision, double vision, ear pain, eye pain, hearing loss, nose bleeds, sore throat Cardiac:  No dizziness, chest pain or heaviness, chest tightness,edema Resp:   Less  ough or sputum porduction, less shortness of breath,wheezing, hemoptysis,  Gi:  Denies swallowing difficulty, stomach pain, nausea or vomiting, diarrhea, constipation, bowel incontinence Gu:  Denies bladder incontinence, burning urine Ext:   Denies Joint pain, stiffness or swelling Skin: Denies  skin rash, easy bruising or bleeding or hives Endoc:  Denies polyuria, polydipsia , polyphagia or weight change Psych:   Denies depression, insomnia or hallucinations   Other:  All other systems negative   VS: BP 98/62 (BP Location: Left Arm)   Pulse 71   Temp (!) 97.5 F (36.4 C) (Oral)   Resp 18   Ht 5\' 6"   (1.676 m)   Wt 60.6 kg   SpO2 99%   BMI 21.56 kg/m      PHYSICAL EXAM    GENERAL:NAD, no fevers, chills, no weakness no fatigue HEAD: Normocephalic, atraumatic.  EYES: Pupils equal, round, reactive to light. Extraocular muscles intact. No scleral icterus.  MOUTH: Moist mucosal membrane. Dentition intact. No abscess noted.  EAR, NOSE, THROAT: Clear without exudates. No external lesions.  NECK: Supple. No thyromegaly. No nodules. No JVD.  PULMONARY: Diffuse coarse rhonchi right sided +wheezes CARDIOVASCULAR: S1 and S2. Regular rate and rhythm. No murmurs, rubs, or gallops. No edema. Pedal pulses 2+ bilaterally.  GASTROINTESTINAL: Soft, nontender, nondistended. No masses. Positive bowel sounds. No hepatosplenomegaly.  MUSCULOSKELETAL: No swelling, clubbing, or edema. Range of motion full in all extremities.  NEUROLOGIC: Cranial nerves II through XII are intact. No gross focal neurological deficits. Sensation intact. Reflexes intact.  SKIN: No ulceration, lesions, rashes, or cyanosis. Skin warm and dry. Turgor intact.  PSYCHIATRIC: Mood, affect within normal limits. The patient is awake, alert and oriented x 3. Insight, judgment intact.       IMAGING    CT Chest Wo Contrast  Result Date: 08/22/2020 CLINICAL DATA:  Abnormal x-ray. Interstitial infiltrates with possible right perihilar cavitation. Cough and shortness of breath. EXAM: CT CHEST WITHOUT CONTRAST TECHNIQUE: Multidetector CT imaging of the chest was performed following the standard protocol without IV contrast. COMPARISON:  Radiographs 08/22/2020 and 08/04/2020.  CT 06/10/2020. FINDINGS: Cardiovascular: Extensive three-vessel coronary artery atherosclerosis with lesser involvement of the aorta and great vessels. The heart size is normal. There is no pericardial effusion. Mediastinum/Nodes: Multiple small mediastinal and hilar lymph nodes have enlarged compared with the chest CT of 2 months ago, likely reactive. No axillary  lymphadenopathy. The thyroid gland, trachea and esophagus demonstrate no significant findings. Lungs/Pleura: There is no pleural effusion or pneumothorax. Moderate centrilobular and paraseptal emphysema. As seen on the earlier radiographs, there is a new cavitary mass inferomedially in the right upper lobe, measuring up to 7.8 x 5.5 cm on image 79/4. This has irregular thick walls and associated air-fluid levels. There was no abnormality in this area on the relatively recent prior chest CT, and this is most consistent with cavitating pneumonia. There is an additional cavitary lesion with an air-fluid level medially in the left upper lobe, measuring up to 2.4 cm on image 65/4. There is mild patchy airspace disease surrounding these cavitary infiltrates. Upper abdomen: Small calcified gallstones. No evidence of biliary dilatation or adrenal mass. Postsurgical changes in the anterior abdominal wall consistent with previous hernia repair. Musculoskeletal/Chest wall: There is no chest wall mass or suspicious osseous finding. There are old fractures of the right 5th and 6th ribs posterolaterally which appear unchanged. IMPRESSION: 1. CT confirms the presence of a cavitary infiltrate in the right upper lobe and shows a smaller area of cavitation anteriorly in the left upper lobe. No abnormalities were present in these areas  on CT performed 2 months ago to suggest cavitary neoplasm, and these findings are consistent with cavitating/necrotizing pneumonia. 2. Mildly enlarged mediastinal and hilar lymph nodes, likely reactive. 3. No associated pleural disease. 4. Cholelithiasis. 5. Underlying moderate Emphysema (ICD10-J43.9). 6. Coronary and Aortic Atherosclerosis (ICD10-I70.0). Electronically Signed   By: Richardean Sale M.D.   On: 08/22/2020 14:26   DG Chest Portable 1 View  Result Date: 08/22/2020 CLINICAL DATA:  Shortness of breath EXAM: PORTABLE CHEST 1 VIEW COMPARISON:  August 04, 2020 FINDINGS: There is apparent  scarring in the right perihilar region. There is suggestion of cavitation in this area in the right perihilar region. Slight scarring lateral right base. The lungs elsewhere are clear. Heart size and pulmonary vascularity are normal. No appreciable adenopathy. No bone lesions. IMPRESSION: Scarring right mid lung. Suggestion of developing cavitation in this area which could be due to overlapping of areas of scarring and normal structures. Given concern for cavitation in this area, noncontrast chest CT advised to further evaluate. Slight scarring lateral right base. Lungs elsewhere are clear. Heart size normal. No adenopathy evident by radiography. Electronically Signed   By: Lowella Grip III M.D.   On: 08/22/2020 13:24   Portable chest 1 View  Result Date: 08/04/2020 CLINICAL DATA:  Pneumonia. EXAM: PORTABLE CHEST 1 VIEW COMPARISON:  08/03/2020 and 06/29/2020 FINDINGS: Chronic coarse lung markings. No new airspace disease. Heart size is stable. Trachea is midline. Old right rib fractures. Negative for a pneumothorax. IMPRESSION: Chronic lung changes without acute findings. Electronically Signed   By: Markus Daft M.D.   On: 08/04/2020 12:24   DG Chest Port 1 View  Result Date: 08/03/2020 CLINICAL DATA:  Shortness of breath and low-grade fever with possible sepsis. EXAM: PORTABLE CHEST 1 VIEW COMPARISON:  June 29, 2020 FINDINGS: The lungs are hyperinflated. Diffuse, chronic appearing increased interstitial lung markings are noted. There is no evidence of acute infiltrate, pleural effusion or pneumothorax. The heart size and mediastinal contours are within normal limits. Chronic sixth and seventh right rib fractures are seen. IMPRESSION: COPD without acute or active cardiopulmonary disease. Electronically Signed   By: Virgina Norfolk M.D.   On: 08/03/2020 19:24      ASSESSMENT/PLAN   This is an un kept male, poor nutrition here with abnormal chest ct: bilateral apical cavitary like findings which  looks more pneumonia , early abcess or infected apical bullae. This does not look like cancer due to the rapid change from 1-2 months X-rays. hiv negative Suggestions: -Would cover anaerobes and MRSA positivity ( zosyn, vanco would be good)  -Pulmonary toilet -nutrition supplement -hold on bronch for now -dvt prophylaxis  Hypothermia resolved ? Due to malnutrition, ? Sepsis, on broad antibiotics - continuing as ordered  Copd -agree with present coverage  Mild hyponatremia ( d/dx: nutritional, pulm dz/pneumonia)  -as per above      Thank you for allowing me to participate in the care of this patient.   Patient/Family are satisfied with care plan and all questions have been answered.  This document was prepared using Dragon voice recognition software and may include unintentional dictation errors.     Wallene Huh, M.D.  Division of [place]

## 2020-08-25 NOTE — Consult Note (Signed)
Pharmacy Antibiotic Note  Marcus [name] is a 60 y.o. male admitted on 08/22/2020 with pumonary abscess.   Pharmacy has been consulted for vancomycin dosing.  -also on Zosyn  Plan:  Dose adjustments this admission:  5/21 1032 Vancomycin 750 mg given 5/21 1420 Vanc Pk = 20 5/21 2116 Vanc Tr = 12  Increase dose to Vancomycin 1000 mg q12h New Calculated AUC 541.5  MRSA PCR +. Quantiferon Gold pending. F/u Scr   Height: 5\' 6"  (167.6 cm) Weight: 60.6 kg (133 lb 9.6 oz) IBW/kg (Calculated) : 63.8  Temp (24hrs), Avg:97.6 F (36.4 C), Min:97.5 F (36.4 C), Max:97.7 F (36.5 C)  Recent Labs  Lab 08/22/20 1229 08/24/20 0801 08/25/20 0527 08/25/20 0628 08/25/20 1420 08/25/20 2116  WBC 3.1*  --  1.1* 1.1*  --   --   CREATININE 0.79 0.51* 0.45*  --   --   --   VANCOTROUGH  --   --   --   --   --  12*  VANCOPEAK  --   --   --   --  20*  --     Estimated Creatinine Clearance: 85.2 mL/min (A) (by C-G formula based on SCr of 0.45 mg/dL (L)).    Allergies  Allergen Reactions  . Levaquin [Levofloxacin In D5w] Other (See Comments)    Chest pain  . Methotrexate Other (See Comments)    Chest pain    Antimicrobials this admission: 5/18 Azithromycin/Ceftriaxone x 1 5/18 Zosyn >> 5/19 Vancomycin >>   Microbiology results: 5/18 BCx: NG x2d  5/19 Sputum: pending  5/19 MRSA culture: + COVID/FLU NEG  Thank you for allowing pharmacy to be a part of this patient's care.  Renda Rolls, PharmD, [facility] 08/25/2020 11:07 PM

## 2020-08-25 NOTE — Evaluation (Signed)
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 CT scan showed cavitary lesion in the right upper lobe.  Suspect lung abscess. Pulminology following. Of note: pt has been hypothermic this hospitalization, refusing warming, thought to be 2/2 relative adrenal insufficiency.   Clinical Impression   Pt seen for OT evaluation this date in setting of acute hospitalization d/t SOB. Pt at rest on 3Lnc when OT presents. Pt reports being INDEP with all ADLs/IADLs at baseline with no use of AD. Pt states that he lives with his 58 y/o mother who gets around well and is able to care for herself. On OT assessment this date, pt presents with generalized weakness of trunk and limbs, decreased fxl activity tolerance, and cardiopulmonary status impacting his ability to safely and efficiently complete ADLs/ADL mobility. Pt requires MIN A to come to EOB sitting and MIN A for STS with RW for stability. Pt requires MIN A with fxl mobility to restroom as well as MIN safety cues. On ADL assessment, pt requires MIN/MOD A for posterior LB bathing in standing, SETUP for seated anterior LB bathing. SETUP for seated g/h tasks as well as UB bathing/dressing. Pt is generally weak and very fatigued after performing these basic self care tasks. Pt left in chair with chair alarm, all needs met and in reach. RN/CNA notified of session contents including VS (soft BP and temp of 97.2). Will continue to follow acutely. Anticipate pt will require STR in skilled facility following hospitalization d/t limited social support and general weakness/deconditioning noted this session.     Follow Up Recommendations  SNF    Equipment Recommendations  3 in 1 bedside commode;Tub/shower  seat;Other (comment) (2ww)    Recommendations for Other Services       Precautions / Restrictions Precautions Precautions: Fall Restrictions Weight Bearing Restrictions: No      Mobility Bed Mobility Overal bed mobility: Needs Assistance Bed Mobility: Supine to Sit     Supine to sit: Min assist;HOB elevated     General bed mobility comments: increased time, HOB elevated, use of rails    Transfers Overall transfer level: Needs assistance Equipment used: Rolling walker (2 wheeled) Transfers: Sit to/from [facility] Sit to Stand: Min assist Stand pivot transfers: Min assist;Mod assist       General transfer comment: unsteady, requires assist to steady upon initially coming to stand.    Balance Overall balance assessment: Needs assistance   Sitting balance-Leahy Scale: Good     Standing balance support: Bilateral upper extremity supported Standing balance-Leahy Scale: Poor Standing balance comment: requires UE support, at least unilaterally.                           ADL either performed or assessed with clinical judgement   ADL Overall ADL's : Needs assistance/impaired                                       General ADL Comments: MIN A with fxl mobility with RW, MIN A for ADL transfers. MIN/MOD A for posterior LB bathing in standing, SETUP for seated anterior. SETUP for seated g/h  tasks.     Vision Patient Visual Report: No change from baseline       Perception     Praxis      Pertinent Vitals/Pain Pain Assessment: No/denies pain     Hand Dominance Left   Extremity/Trunk Assessment Upper Extremity Assessment Upper Extremity Assessment: Generalized weakness   Lower Extremity Assessment Lower Extremity Assessment: Generalized weakness   Cervical / Trunk Assessment Cervical / Trunk Assessment: Kyphotic   Communication Communication Communication: No difficulties;Other (comment) (note that pt reports he  is not able to read)   Cognition Arousal/Alertness: Awake/alert Behavior During Therapy: WFL for tasks assessed/performed Overall Cognitive Status: Within Functional Limits for tasks assessed                                 General Comments: Pt does not appear to be oriented to situation, he is otherwise oriented. Demos some decreased safety awareness/lack of insight into deficits.   General Comments       Exercises Other Exercises Other Exercises: OT ed re: role of OT in acute setting. pt with MIN/MOD reception   Shoulder Instructions      Home Living Family/patient expects to be discharged to:: Private residence Living Arrangements: Parent Available Help at Discharge: Family Type of Home: House Home Access: Stairs to enter Technical brewer of Steps: 4 Entrance Stairs-Rails: Right;Left;Can reach both [place]: One level     Bathroom Shower/Tub: Teacher, early years/pre: [place]: Environmental consultant - 2 wheels;Cane - single point          Prior Functioning/Environment Level of Independence: Independent        Comments: Pt lives with his 53 y/o mother and states she is able to take care of herself and get around. States that they share house responsebilities. Community and [place] amb wtih no AD. Reports no recent falls.        OT Problem List: Decreased strength;Decreased range of motion;Decreased activity tolerance;Impaired balance (sitting and/or standing);Decreased safety awareness;Decreased knowledge of use of DME or AE;Cardiopulmonary status limiting activity      OT Treatment/Interventions: Self-care/ADL training;DME and/or AE instruction;Therapeutic activities;Balance training;Therapeutic exercise;Energy conservation;Patient/family education    OT Goals(Current goals can be found in the care plan section) Acute Rehab OT Goals Patient Stated Goal: get stronger and get back home OT Goal Formulation: With patient Time For  Goal Achievement: 09/08/20 Potential to Achieve Goals: Good ADL Goals Pt Will Perform Lower Body Dressing: with supervision;sit to/from stand Pt Will Transfer to Toilet: with supervision;ambulating;grab bars Pt Will Perform Toileting - Clothing Manipulation and hygiene: with supervision;sit to/from stand Pt/caregiver will Perform Home Exercise Program: Increased strength;Both right and left upper extremity;With Supervision Additional ADL Goal #1: Pt will tolerate 6 min standing task (ADL versus theract) to increase fxl standing tolerance.  OT Frequency: Min 1X/week   Barriers to D/C:            Co-evaluation              AM-PAC OT "6 Clicks" Daily Activity     Outcome Measure Help from another person eating meals?: None Help from another person taking care of personal grooming?: A Little Help from another person toileting, which includes using toliet, bedpan, or urinal?: A Little Help from another person bathing (including washing, rinsing, drying)?: A Lot Help from another person to put on and taking off regular upper body clothing?: A  Little Help from another person to put on and taking off regular lower body clothing?: A Lot 6 Click Score: 17   End of Session Equipment Utilized During Treatment: Gait belt;Rolling walker;Oxygen Nurse Communication: Mobility status;Other (comment) (notified BP soft 89/53 (66) with standing, 92/62 (71) in sitting. Temp 97.2)  Activity Tolerance: Patient tolerated treatment well Patient left: in chair;with call bell/phone within reach  OT Visit Diagnosis: Unsteadiness on feet (R26.81);Muscle weakness (generalized) (M62.81);Adult, failure to thrive (R62.7)                Time: 4068-4033 OT Time Calculation (min): 40 min Charges:  OT General Charges $OT Visit: 1 Visit OT Evaluation $OT Eval Moderate Complexity: 1 Mod OT Treatments $Self Care/Home Management : 8-22 mins $Therapeutic Activity: 8-22 mins  Gerrianne Scale, MS, OTR/L ascom  [phone] 08/25/20, 5:02 PM

## 2020-08-25 NOTE — Progress Notes (Signed)
PROGRESS NOTE    Marcus [name]  YJE:[id number] DOB: 1961/03/18 DOA: 08/22/2020 PCP: [name], FNP   Chief complaint.  Shortness of breath. Brief Narrative:  Marcus [name] is a 60-year-male with history of COPD, chronic hypoxemic respiratoryfailure on 2 L oxygen, rheumatoid arthritis, pancytopenia, Felty syndrome who came to the hospital with complaints of short of breath and a cough for at least a month. Upon arriving the emergency room, his CT scan showed cavitary lesion in the right upper lobe. Suspect lung abscess, he is seen by pulmonology, placed on Zosyn, also added vancomycin.   Assessment & Plan:   Active Problems:   Moderate protein-calorie malnutrition (HCC)   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HCC)   Chronic obstructive pulmonary disease (HCC)   Hypokalemia   Chronic diastolic CHF (congestive heart failure) (HCC)   Hyponatremia   Cavitary pneumonia   Acute on chronic respiratory failure with hypoxia ([place])   Hypothermia  #1. left upper lobe cavitary pneumonia. Blood culture negative, sputum culture still pending.  Positive MRSA culture.  Acid-fast smear negative x1. We will continue vancomycin and Zosyn for now.  2.  Rheumatoid arthritis. Felty syndrome. Pancytopenia. Worsening neutropenia. Hypothermia. Relative adrenal insufficiency. Patient had a hypothermia yesterday, thought to be due to relative adrenal insufficiency.  Increased steroids to 10 mg twice a day, hypothermia resolved after that.  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  Etiology is still unclear, could be due to infection.  Patient will be placed on neutropenic precautions, recheck a CBC tomorrow.  If white cell count is still low, I will consult hematology.  #3.  Acute on chronic hypoxemic respiratory failure. COPD. Condition relatively stable.  No evidence of bronchospasm.  4.  Likely dementia. Mental status seem to be better today.  Follow-up  with PCP as outpatient.  5.  Hypokalemia Improved.  DVT prophylaxis: Lovenox Code Status: Full Family Communication:  Disposition Plan:  .   Status is: Inpatient  Remains inpatient appropriate because:Inpatient level of care appropriate due to severity of illness   Dispo: The patient is from: Home              Anticipated d/c is to: Home              Patient currently is not medically stable to d/c.   Difficult to place patient No        I/O last 3 completed shifts: In: 302.3 [IV Piggyback:302.3] Out: 1800 [Urine:1800] Total I/O In: -  Out: 175 [Urine:175]     Consultants:   Pulmonology  Procedures: None  Antimicrobials: Zosyn and vancomycin.  Subjective: Patient feels better today, temperature has normalized.  Good appetite, no nausea vomiting.  He does not have a confusion today. Denies any short of breath or cough. No abdominal pain or nausea vomiting. No fever or chills. No chest pain or palpitation.  Objective: Vitals:   08/24/20 2004 08/25/20 0027 08/25/20 0546 08/25/20 1113  BP: 109/62 113/66 102/66 98/62  Pulse: 78 71 66 71  Resp: 18 (!) 22 20 18   Temp: (!) 93.7 F (34.3 C) (!) 97.5 F (36.4 C) (!) 97.5 F (36.4 C)   TempSrc: Rectal Oral Oral   SpO2: 95% 97% 95% 99%  Weight:      Height:        Intake/Output Summary (Last 24 hours) at 08/25/2020 1307 Last data filed at 08/25/2020 0900 Gross per 24 hour  Intake --  Output 1125 ml  Net -1125  ml   Filed Weights   08/22/20 1220 08/24/20 0235  Weight: 57.1 kg 60.6 kg    Examination:  General exam: Appears calm and comfortable  Respiratory system: Clear to auscultation. Respiratory effort normal. Cardiovascular system: S1 & S2 heard, RRR. No JVD, murmurs, rubs, gallops or clicks. No pedal edema. Gastrointestinal system: Abdomen is nondistended, soft and nontender. No organomegaly or masses felt. Normal bowel sounds heard. Central nervous system: Alert and oriented. No focal  neurological deficits. Extremities: Symmetric 5 x 5 power. Skin: No rashes, lesions or ulcers Psychiatry: Judgement and insight appear normal. Mood & affect appropriate.     Data Reviewed: I have personally reviewed following labs and imaging studies  CBC: Recent Labs  Lab 08/22/20 1229 08/25/20 0527 08/25/20 0628  WBC 3.1* 1.1* 1.1*  NEUTROABS  --  0.2* 0.2*  HGB 13.9 12.1* 12.2*  HCT 43.2 38.0* 38.0*  MCV 84.5 85.4 85.0  PLT 127* 134* 034*   Basic Metabolic Panel: Recent Labs  Lab 08/22/20 1229 08/24/20 0801 08/25/20 0527  NA 132*  --  142  K 4.1  --  3.3*  CL 98  --  109  CO2 25  --  28  GLUCOSE 169*  --  145*  BUN 27*  --  13  CREATININE 0.79 0.51* 0.45*  CALCIUM 8.0*  --  7.8*  MG  --   --  2.0   GFR: Estimated Creatinine Clearance: 85.2 mL/min (A) (by C-G formula based on SCr of 0.45 mg/dL (L)). Liver Function Tests: Recent Labs  Lab 08/22/20 1229  AST 37  ALT 18  ALKPHOS 116  BILITOT 1.1  PROT 6.6  ALBUMIN 2.0*   No results for input(s): LIPASE, AMYLASE in the last 168 hours. No results for input(s): AMMONIA in the last 168 hours. Coagulation Profile: No results for input(s): INR, PROTIME in the last 168 hours. Cardiac Enzymes: No results for input(s): CKTOTAL, CKMB, CKMBINDEX, TROPONINI in the last 168 hours. BNP (last 3 results) No results for input(s): PROBNP in the last 8760 hours. HbA1C: No results for input(s): HGBA1C in the last 72 hours. CBG: No results for input(s): GLUCAP in the last 168 hours. Lipid Profile: No results for input(s): CHOL, HDL, LDLCALC, TRIG, CHOLHDL, LDLDIRECT in the last 72 hours. Thyroid Function Tests: Recent Labs    08/25/20 0527  TSH 1.706   Anemia Panel: No results for input(s): VITAMINB12, FOLATE, FERRITIN, TIBC, IRON, RETICCTPCT in the last 72 hours. Sepsis Labs: No results for input(s): PROCALCITON, LATICACIDVEN in the last 168 hours.  Recent Results (from the past 240 hour(s))  Resp Panel by  RT-PCR (Flu A&B, Covid) Nasopharyngeal Swab     Status: None   Collection Time: 08/22/20 12:45 PM   Specimen: Nasopharyngeal Swab; Nasopharyngeal(NP) swabs in vial transport medium  Result Value Ref Range Status   SARS Coronavirus 2 by RT PCR NEGATIVE NEGATIVE Final    Comment: (NOTE) SARS-CoV-2 target nucleic acids are NOT DETECTED.  The SARS-CoV-2 RNA is generally detectable in upper respiratory specimens during the acute phase of infection. The lowest concentration of SARS-CoV-2 viral copies this assay can detect is 138 copies/mL. A negative result does not preclude SARS-Cov-2 infection and should not be used as the sole basis for treatment or other patient management decisions. A negative result may occur with  improper specimen collection/handling, submission of specimen other than nasopharyngeal swab, presence of viral mutation(s) within the areas targeted by this assay, and inadequate number of viral copies(<138 copies/mL). A negative  result must be combined with clinical observations, patient history, and epidemiological information. The expected result is Negative.  Fact Sheet for Patients:  EntrepreneurPulse.com.au  Fact Sheet for Healthcare Providers:  IncredibleEmployment.be  This test is no t yet approved or cleared by the [place] FDA and  has been authorized for detection and/or diagnosis of SARS-CoV-2 by FDA under an Emergency Use Authorization (EUA). This EUA will remain  in effect (meaning this test can be used) for the duration of the COVID-19 declaration under Section 564(b)(1) of the Act, 21 U.S.C.section 360bbb-3(b)(1), unless the authorization is terminated  or revoked sooner.       Influenza A by PCR NEGATIVE NEGATIVE Final   Influenza B by PCR NEGATIVE NEGATIVE Final    Comment: (NOTE) The Xpert Xpress SARS-CoV-2/FLU/RSV plus assay is intended as an aid in the diagnosis of influenza from Nasopharyngeal swab  specimens and should not be used as a sole basis for treatment. Nasal washings and aspirates are unacceptable for Xpert Xpress SARS-CoV-2/FLU/RSV testing.  Fact Sheet for Patients: EntrepreneurPulse.com.au  Fact Sheet for Healthcare Providers: IncredibleEmployment.be  This test is not yet approved or cleared by the [place] FDA and has been authorized for detection and/or diagnosis of SARS-CoV-2 by FDA under an Emergency Use Authorization (EUA). This EUA will remain in effect (meaning this test can be used) for the duration of the COVID-19 declaration under Section 564(b)(1) of the Act, 21 U.S.C. section 360bbb-3(b)(1), unless the authorization is terminated or revoked.  Performed at [facility], [address] [place], [place] [address]   Blood culture (routine x 2)     Status: None (Preliminary result)   Collection Time: 08/22/20  3:18 PM   Specimen: BLOOD  Result Value Ref Range Status   Specimen Description BLOOD RIGHT ANTECUBITAL  Final   Special Requests   Final    BOTTLES DRAWN AEROBIC AND ANAEROBIC Blood Culture adequate volume   Culture   Final    NO GROWTH 3 DAYS Performed at [facility], [address], [place], [place] [address]    Report Status PENDING  Incomplete  Blood culture (routine x 2)     Status: None (Preliminary result)   Collection Time: 08/22/20  3:18 PM   Specimen: BLOOD  Result Value Ref Range Status   Specimen Description BLOOD LEFT ANTECUBITAL  Final   Special Requests   Final    BOTTLES DRAWN AEROBIC AND ANAEROBIC Blood Culture adequate volume   Culture   Final    NO GROWTH 3 DAYS Performed at [facility], [address], [place], [place] [address]    Report Status PENDING  Incomplete  Expectorated Sputum Assessment w Gram Stain, Rflx to Resp Cult     Status: None   Collection Time: 08/23/20  4:15 AM   Specimen: Expectorated Sputum  Result Value Ref Range Status    Specimen Description EXPECTORATED SPUTUM  Final   Special Requests NONE  Final   Sputum evaluation   Final    THIS SPECIMEN IS ACCEPTABLE FOR SPUTUM CULTURE Performed at [facility], [address], [place], [place] [address]    Report Status 08/24/2020 FINAL  Final  Culture, Respiratory w Gram Stain     Status: None (Preliminary result)   Collection Time: 08/23/20  4:15 AM  Result Value Ref Range Status   Specimen Description   Final    EXPECTORATED SPUTUM Performed at [facility], [address] [place], [place] [address]  Special Requests   Final    NONE Reflexed from [phone] Performed at [place], [place] [address]    Gram Stain   Final    MODERATE WBC PRESENT,BOTH PMN AND MONONUCLEAR MODERATE GRAM POSITIVE COCCI IN PAIRS IN CLUSTERS Performed at [facility], [place] [address], [place], [place] [address]    Culture PENDING  Incomplete   Report Status PENDING  Incomplete  MRSA PCR Screening     Status: Abnormal   Collection Time: 08/24/20  3:15 AM   Specimen: Nasopharyngeal  Result Value Ref Range Status   MRSA by PCR POSITIVE (A) NEGATIVE Final    Comment:        The GeneXpert MRSA Assay (FDA approved for NASAL specimens only), is one component of a comprehensive MRSA colonization surveillance program. It is not intended to diagnose MRSA infection nor to guide or monitor treatment for MRSA infections. RESULT CALLED TO, READ BACK BY AND VERIFIED WITH: MIA YOUNG AT 0530 ON 08/24/2020 [place]. Performed at [facility], [place], [place] [address]   Acid Fast Smear (AFB)     Status: None   Collection Time: 08/24/20  4:15 AM   Specimen: Sputum  Result Value Ref Range Status   AFB Specimen Processing Concentration  Final   Acid Fast Smear Negative  Final    Comment: (NOTE) Performed At: [facility] [place], [place] [id number] Rush Farmer MD  [id number]    Source (AFB) EXPECTORATED SPUTUM  Final    Comment: Performed at [facility], [address] [place], [place] [address]         Radiology Studies: No results found.      Scheduled Meds: . Chlorhexidine Gluconate Cloth  6 each Topical Q0600  . enoxaparin (LOVENOX) injection  40 mg Subcutaneous Q24H  . feeding supplement  237 mL Oral BID BM  . mometasone-formoterol  2 puff Inhalation BID  . mupirocin ointment  1 application Nasal BID  . pantoprazole  40 mg Oral Daily  . predniSONE  10 mg Oral BID WC   Continuous Infusions: . piperacillin-tazobactam (ZOSYN)  IV 3.375 g (08/25/20 0520)  . vancomycin 750 mg (08/25/20 1032)     LOS: 3 days    Time spent: 35 minutes    Sharen Hones, MD [facility]   To contact the attending provider between 7A-7P or the covering provider during after hours 7P-7A, please log into the web site [email or web address] and access using universal [place] password for that web site. If you do not have the password, please call the hospital operator.  08/25/2020, 1:07 PM

## 2020-08-25 NOTE — TOC Initial Note (Signed)
Transition of Care [facility]) - Initial/Assessment Note    Patient Details  Name: Marcus [name] MRN: [id number] Date of Birth: 19-May-1960  Transition of Care [facility]) CM/SW Contact:    Marcus Ivan, LCSW Phone Number: 08/25/2020, 12:30 PM  Clinical Narrative:                 Per chart review, patient is disoriented. CSW spoke to patient's brother Marcus [name] for high risk screening. Marcus [name] reported patient lives with his Mother, confirmed address in chart is correct. PCP is Marcus Chandler, NP. Pharmacy is [facility] in [place]. Patient has home o2 through [employer]. No HH or SNF history that Marcus [name] is aware of. Marcus [name] stated patient has a Clinical biochemist who sets up transport to appointments for him Marcus [name] [phone]). Marcus [name] reported patient has been disoriented since around March. He reported patient has been in bed unable to walk, using the bathroom on himself, and not eating much for the past few months. Family feels he needs short term rehab as they do not feel patient's Mother can manage his needs in his current physical state. Explained that PT/OT evals are pending and TOC will follow for their recommendations. Marcus [name] reported patient has had 2 of the COVID vaccines in May or June of 2021. TOC will follow for PT/OT evals.   Expected Discharge Plan: Skilled Nursing Facility Barriers to Discharge: Continued Medical Work up   Patient Goals and CMS Choice Patient states their goals for this hospitalization and ongoing recovery are:: family feels he needs short term rehab CMS Medicare.gov Compare Post Acute Care list provided to:: Patient Represenative (must comment) Choice offered to / list presented to : Sibling  Expected Discharge Plan and Services Expected Discharge Plan: [place]       Living arrangements for the past 2 months: Single Family Home                                      Prior Living Arrangements/Services Living arrangements for the past 2 months: Single  Family Home Lives with:: Parents Patient language and need for interpreter reviewed:: Yes Do you feel safe going back to the place where you live?: Yes      Need for Family Participation in Patient Care: Yes (Comment) Care giver support system in place?: Yes (comment) Current home services: DME Criminal Activity/Legal Involvement Pertinent to Current Situation/Hospitalization: No - Comment as needed  Activities of Daily Living Home Assistive Devices/Equipment: None ADL Screening (condition at time of admission) Patient's cognitive ability adequate to safely complete daily activities?: Yes Is the patient deaf or have difficulty hearing?: No Does the patient have difficulty seeing, even when wearing glasses/contacts?: No Does the patient have difficulty concentrating, remembering, or making decisions?: No Patient able to express need for assistance with ADLs?: Yes Does the patient have difficulty dressing or bathing?: No Independently performs ADLs?: Yes (appropriate for developmental age) Does the patient have difficulty walking or climbing stairs?: No Weakness of Legs: Both Weakness of Arms/Hands: None  Permission Sought/Granted Permission sought to share information with : Facility Art therapist granted to share information with : Yes, Verbal Permission Granted (by brother Marcus [name])     Permission granted to share info w AGENCY: SNFs, HH, DME        Emotional Assessment       Orientation: : Fluctuating Orientation (Suspected and/or reported Sundowners) Alcohol / Substance Use: Not Applicable  Psych Involvement: No (comment)  Admission diagnosis:  Weakness [R53.1] Cavitary pneumonia [J18.9, J98.4] Community acquired pneumonia, unspecified laterality [J18.9] Patient Active Problem List   Diagnosis Date Noted  . Hypothermia 08/24/2020  . Cavitary pneumonia 08/22/2020  . Acute on chronic respiratory failure with hypoxia ([place]) 08/22/2020  . Hyponatremia  08/04/2020  . Generalized weakness 08/04/2020  . Dehydration 08/03/2020  . Septic shock ([place]) 06/29/2020  . CAP (community acquired pneumonia) 06/29/2020  . COPD exacerbation ([place]) 06/11/2020  . Chest pain 06/11/2020  . Cellulitis 06/11/2020  . Chronic diastolic CHF (congestive heart failure) ([place]) 06/11/2020  . Severe sepsis with septic shock ([place]) 01/20/2020  . Acute hypoxemic respiratory failure ([place]) 01/20/2020  . Hypokalemia 01/20/2020  . Perforated prepyloric ulcer 01/18/2020 01/18/2020  . Pneumoperitoneum 01/17/2020  . COPD with acute exacerbation ([place]) 08/18/2019  . 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 Anemia 11/14/2015  . Seropositive rheumatoid arthritis ([place])   . Chronic obstructive pulmonary disease ([place])   . Moderate protein-calorie malnutrition ([place]) 07/24/2015  . Pancytopenia ([place]) 07/23/2015  . 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 Leukopenia 10/21/2012  . 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 Abnormal EKG 10/21/2012  . Joint pain 10/21/2012  . Splenomegaly 10/21/2012   PCP:  Marcus Clarks, FNP Pharmacy:   [id number] - [place], [place] - [address] [place] [address] Phone: [phone] Fax: [place], [place] - [address] [place] [address] Phone: [phone] Fax: [phone]     Social Determinants of Health (SDOH) Interventions    Readmission Risk Interventions Readmission Risk Prevention Plan 08/25/2020  Transportation Screening Complete  PCP or Specialist Appt within 3-5 Days Complete  HRI or [place] Complete  Social Work Consult for [place] Planning/Counseling Complete  Palliative Care Screening Complete  Medication Review Press photographer) Complete  Some recent data might be hidden

## 2020-08-25 NOTE — Progress Notes (Signed)
PT Cancellation Note  Patient Details Name: TZVI ECONOMOU MRN: [id number] DOB: 05/29/1960   Cancelled Treatment:    Reason Eval/Treat Not Completed: Other (comment).   Pt is lethargic and unable to get him to sit up now.  Follow up as time and pt allow.   Ramond Dial 08/25/2020, 2:09 PM  Mee Hives, PT MS Acute Rehab Dept. Number: [place] and [place]

## 2020-08-25 NOTE — Progress Notes (Signed)
PT Cancellation Note  Patient Details Name: Marcus [name] MRN: [id number] DOB: [birth year]   Cancelled Treatment:    Reason Eval/Treat Not Completed: Other (comment).  States he has already gotten up and not interested in more work right now.  Retry at another time.   Ramond Dial 08/25/2020, 11:27 AM Mee Hives, PT MS Acute Rehab Dept. Number: [place] and [place]

## 2020-08-26 DIAGNOSIS — J189 Pneumonia, unspecified organism: Secondary | ICD-10-CM | POA: Diagnosis not present

## 2020-08-26 DIAGNOSIS — J984 Other disorders of lung: Secondary | ICD-10-CM | POA: Diagnosis not present

## 2020-08-26 DIAGNOSIS — M0500 Felty's syndrome, unspecified site: Secondary | ICD-10-CM | POA: Diagnosis not present

## 2020-08-26 DIAGNOSIS — J9621 Acute and chronic respiratory failure with hypoxia: Secondary | ICD-10-CM | POA: Diagnosis not present

## 2020-08-26 LAB — BASIC METABOLIC PANEL
Anion gap: 6 (ref 5–15)
BUN: 12 mg/dL (ref 6–20)
CO2: 29 mmol/L (ref 22–32)
Calcium: 8 mg/dL — ABNORMAL LOW (ref 8.9–10.3)
Chloride: 107 mmol/L (ref 98–111)
Creatinine, Ser: 0.39 mg/dL — ABNORMAL LOW (ref 0.61–1.24)
GFR, Estimated: >60 mL/min (ref >60)
Glucose, Bld: 81 mg/dL (ref 70–99)
Potassium: 3.3 mmol/L — ABNORMAL LOW (ref 3.5–5.1)
Sodium: 142 mmol/L (ref 135–145)

## 2020-08-26 LAB — CBC WITH DIFFERENTIAL/PLATELET
Abs Immature Granulocytes: 0.09 10*3/uL — ABNORMAL HIGH (ref 0.00–0.07)
Basophils Absolute: 0 10*3/uL (ref 0.0–0.1)
Basophils Relative: 1 %
Eosinophils Absolute: 0 10*3/uL (ref 0.0–0.5)
Eosinophils Relative: 1 %
HCT: 39.2 % (ref 39.0–52.0)
Hemoglobin: 12.4 g/dL — ABNORMAL LOW (ref 13.0–17.0)
Immature Granulocytes: 5 %
Lymphocytes Relative: 32 %
Lymphs Abs: 0.6 10*3/uL — ABNORMAL LOW (ref 0.7–4.0)
MCH: 27.3 pg (ref 26.0–34.0)
MCHC: 31.6 g/dL (ref 30.0–36.0)
MCV: 86.2 fL (ref 80.0–100.0)
Monocytes Absolute: 0.5 10*3/uL (ref 0.1–1.0)
Monocytes Relative: 30 %
Neutro Abs: 0.5 10*3/uL — ABNORMAL LOW (ref 1.7–7.7)
Neutrophils Relative %: 31 %
Platelets: 130 10*3/uL — ABNORMAL LOW (ref 150–400)
RBC: 4.55 MIL/uL (ref 4.22–5.81)
RDW: 17.8 % — ABNORMAL HIGH (ref 11.5–15.5)
Smear Review: NORMAL
WBC: 1.7 10*3/uL — ABNORMAL LOW (ref 4.0–10.5)
nRBC: 2.9 % — ABNORMAL HIGH (ref 0.0–0.2)

## 2020-08-26 LAB — CULTURE, RESPIRATORY W GRAM STAIN

## 2020-08-26 LAB — MAGNESIUM: Magnesium: 1.8 mg/dL (ref 1.7–2.4)

## 2020-08-26 MED ORDER — POTASSIUM CHLORIDE 20 MEQ PO PACK
40.0000 meq | PACK | Freq: Two times a day (BID) | ORAL | Status: AC
  Administered 2020-08-26 (×2): 40 meq via ORAL
  Filled 2020-08-26 (×2): qty 2

## 2020-08-26 NOTE — Evaluation (Signed)
Physical Therapy Evaluation Patient Details Name: Marcus [name] MRN: [id number] DOB: 1960-07-14 Today's Date: 08/26/2020   History of Present Illness  Pt is a 60 y/o M with PMH: tobacco use d/o, RA, pancytopenia, COPD (on chronic 2Lnc), and Felty syndrome who presented to ED with c/o SOB and cough x1 month. CT scan showed cavitary lesion in the right upper lobe.  Suspect lung abscess. Pulminology following. Of note: pt has been hypothermic this hospitalization, refusing warming, thought to be 2/2 relative adrenal insufficiency.    Clinical Impression  Pt is a 60 year old M admitted to hospital on 5/18 for cough and SOB. At baseline, pt states independence with functional mobility and ADL's, requiring assist from 77yo mother for IADL's. Per brother report, pt has been bedbound since March. Pt presents with generalized weakness, decreased activity tolerance, decreased standing balance, poor safety awareness, increased abdominal pain, +1 non-pitting edema in R ankle, and protruding large hernia, resulting in impaired functional mobility. Due to deficits, pt required mod I for bed mobility, mod assist for transfers, and CGA for short distance ambulation with RW. Further mobility deferred due to protruding hernia and pt c/o 10/10 abdominal pain. Per conversation with RN, MD cleared pt for mobility as long as the hernia can be reduced. Pt with hx of hernia and repair last year, however, current hernia was not noted in pt's chart for this recent hospitalization. Deficits limit the pt's ability to safely and independently perform ADL's, transfer, and ambulate. Pt will benefit from acute skilled PT services to address deficits for return to baseline function. At this time, PT recommends SNF at DC to address deficits for improved safety with functional mobility prior to return home, for decreased caregiver burden. Pt not agreeable, stating he needs to get home ASAP.     Follow Up Recommendations SNF;Supervision for  mobility/OOB    Equipment Recommendations  Other (comment) (defer to post acute)    Recommendations for Other Services       Precautions / Restrictions Precautions Precautions: Fall Restrictions Weight Bearing Restrictions: No      Mobility  Bed Mobility Overal bed mobility: Needs Assistance Bed Mobility: Supine to Sit     Supine to sit: Modified independent (Device/Increase time)     General bed mobility comments: Mod I to sit EOB with use of handrails for support, requiring increased time/effort to achieve mobility.    Transfers Overall transfer level: Needs assistance Equipment used: Rolling walker (2 wheeled) Transfers: Sit to/from Stand Sit to Stand: Mod assist         General transfer comment: Mod assist to stand from elevated bed height with RW; verbal cues for hand placement and sequencing.  Ambulation/Gait Ambulation/Gait assistance: Min guard Gait Distance (Feet): 5 Feet Assistive device: Rolling walker (2 wheeled)       General Gait Details: CGA for safety and line management to ambulate short distance with RW; further mobility limited secondary to large hernia and c/o 10/10 abdominal pain. Pt demonstrates narrow BOS, decreased step length/foot clearance bil, downward gaze, and slowed cadence.     Balance Overall balance assessment: Needs assistance Sitting-balance support: Bilateral upper extremity supported;Feet supported Sitting balance-Leahy Scale: Good Sitting balance - Comments: No LOB while scooting forward towards EOB   Standing balance support: Bilateral upper extremity supported Standing balance-Leahy Scale: Fair Standing balance comment: Increased UE support on RW  Pertinent Vitals/Pain Pain Assessment: 0-10 Pain Score: 10-Worst pain ever Pain Location: abdomen; pt with large hernia (RN notified) Pain Intervention(s): Limited activity within patient's tolerance;Monitored during session     [place] expects to be discharged to:: Private residence Living Arrangements: Parent Available Help at Discharge: Family Type of Home: House Home Access: Stairs to enter Entrance Stairs-Rails: Right;Left;Can reach both Entrance Stairs-Number of Steps: 4 Home Layout: One level Home Equipment: Environmental consultant - 2 wheels;Cane - single point      Prior Function Level of Independence: Independent         Comments: Pt lives with his 55 y/o mother and states she is able to take care of herself and get around. States that they share house responsebilities. Community and [place] amb wtih no AD. Reports no recent falls. Per CM note (with conversation from brother), pt has been bedbound, toileting on himself, and hasn't been eating much since March.     Hand Dominance   Dominant Hand: Left    Extremity/Trunk Assessment   Upper Extremity Assessment Upper Extremity Assessment: Defer to OT evaluation    Lower Extremity Assessment Lower Extremity Assessment: Generalized weakness (generalized weakness R>L)    Cervical / Trunk Assessment Cervical / Trunk Assessment: Kyphotic  Communication   Communication: No difficulties;Other (comment) (note that pt reports he is not able to read)  Cognition Arousal/Alertness: Awake/alert Behavior During Therapy: WFL for tasks assessed/performed Overall Cognitive Status: Within Functional Limits for tasks assessed                                 General Comments: Pt is A&O x3, unable to state situation that brought him to the hospital. Able to follow 100% of simple 2 step commands.      General Comments General comments (skin integrity, edema, etc.): Large hernia noted in pt's abdomen with pt c/o 10/10 abdominal pain. Per conversation with RN, MD states that "mobility is ok as long as the hernia could be reduced". Pt with +1 non-pitting edema of R ankle.    Exercises Other Exercises Other Exercises: Pt able to participate  in bed mobility, transfers, and limited gait with RW. Increased assist required for STS from elevated bed height. Further mobility limited secondary to abdominal hernia and c/o 10/10 abdominal pain; RN notified. Per RN conversation with MD "mobility is ok if hernia is able to be reduced". Other Exercises: Pt educated regarding: PT role/POC, DC recommendations, safety with mobility, benefits of OOB mobility, call for help, and limited mobility due to hernia. He verbalized understanding.   Assessment/Plan    PT Assessment Patient needs continued PT services  PT Problem List Decreased strength;Decreased activity tolerance;Decreased balance;Decreased mobility;Cardiopulmonary status limiting activity;Decreased safety awareness;Pain       PT Treatment Interventions DME instruction;Gait training;Stair training;Functional mobility training;Therapeutic activities;Patient/family education;Balance training;Therapeutic exercise;Manual techniques    PT Goals (Current goals can be found in the Care Plan section)  Acute Rehab PT Goals Patient Stated Goal: get stronger and get back home PT Goal Formulation: With patient Time For Goal Achievement: 09/16/20 Potential to Achieve Goals: Fair    Frequency Min 2X/week   Barriers to discharge Other (comment) pt lives with elderly mother. Limited support other than her reported       AM-PAC PT "6 Clicks" Mobility  Outcome Measure Help needed turning from your back to your side while in a flat bed without using bedrails?: None Help needed moving from  lying on your back to sitting on the side of a flat bed without using bedrails?: None Help needed moving to and from a bed to a chair (including a wheelchair)?: A Little Help needed standing up from a chair using your arms (e.g., wheelchair or bedside chair)?: A Lot Help needed to walk in hospital room?: A Little Help needed climbing 3-5 steps with a railing? : A Lot 6 Click Score: 18    End of Session  Equipment Utilized During Treatment: Gait belt Activity Tolerance: Patient tolerated treatment well;Other (comment) (limited secondary to hernia and 10/10 pain) Patient left: with call bell/phone within reach;in chair;with chair alarm set;with nursing/sitter in room Nurse Communication: Mobility status (hernia, leaking IV, linen change) PT Visit Diagnosis: Unsteadiness on feet (R26.81);Muscle weakness (generalized) (M62.81);Difficulty in walking, not elsewhere classified (R26.2);Pain Pain - part of body:  (abdomen)    Time: 4709-6283 PT Time Calculation (min) (ACUTE ONLY): 25 min   Charges:   PT Evaluation $PT Eval Low Complexity: 1 Low        Herminio Commons, PT, DPT 1:54 PM,08/26/20

## 2020-08-26 NOTE — NC FL2 (Signed)
[place] LEVEL OF CARE SCREENING TOOL     IDENTIFICATION  Patient Name: Marcus [name] Birthdate: 1960-07-19 Sex: male Admission Date (Current Location): 08/22/2020  [place] and [place] Number:  Engineering geologist and Address:  [facility], [address], [place],  [address]      Provider Number: [id number]  Attending Physician Name and Address:  Sharen Hones, MD  Relative Name and Phone Number:  [place], haydu [facility])   [phone]    Current Level of Care: SNF Recommended Level of Care:   Prior Approval Number:    Date Approved/Denied:   PASRR Number: [id number]  Discharge Plan:      Current Diagnoses: Patient Active Problem List   Diagnosis Date Noted  . Hypothermia 08/24/2020  . Cavitary pneumonia 08/22/2020  . Acute on chronic respiratory failure with hypoxia ([place]) 08/22/2020  . Hyponatremia 08/04/2020  . Generalized weakness 08/04/2020  . Dehydration 08/03/2020  . Septic shock ([place]) 06/29/2020  . CAP (community acquired pneumonia) 06/29/2020  . COPD exacerbation ([place]) 06/11/2020  . Chest pain 06/11/2020  . Cellulitis 06/11/2020  . Chronic diastolic CHF (congestive heart failure) ([place]) 06/11/2020  . Severe sepsis with septic shock ([place]) 01/20/2020  . Acute hypoxemic respiratory failure ([place]) 01/20/2020  . Hypokalemia 01/20/2020  . Perforated prepyloric ulcer 01/18/2020 01/18/2020  . Pneumoperitoneum 01/17/2020  . COPD with acute exacerbation ([place]) 08/18/2019  . 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 Anemia 11/14/2015  . Seropositive rheumatoid arthritis ([place])   . Chronic obstructive pulmonary disease ([place])   . Moderate protein-calorie malnutrition ([place]) 07/24/2015  . Pancytopenia ([place]) 07/23/2015  . 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 Leukopenia 10/21/2012  . 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 Abnormal EKG 10/21/2012  . Joint pain 10/21/2012  . Splenomegaly 10/21/2012    Orientation RESPIRATION BLADDER Height &  Weight     Self,Time,Place  O2 Continent Weight: 133 lb 9.6 oz (60.6 kg) Height:  5\' 6"  (167.6 cm)  BEHAVIORAL SYMPTOMS/MOOD NEUROLOGICAL BOWEL NUTRITION STATUS      Continent Diet (regular; thin liquids)  AMBULATORY STATUS COMMUNICATION OF NEEDS Skin   Extensive Assist Verbally Normal                       Personal Care Assistance Level of Assistance  Bathing,Feeding,Dressing Bathing Assistance: Maximum assistance Feeding assistance: Limited assistance Dressing Assistance: Maximum assistance     Functional Limitations Info             SPECIAL CARE FACTORS FREQUENCY  PT (By licensed PT),OT (By licensed OT)     PT Frequency: 5 x/week OT Frequency: 5 x/week            Contractures      Additional Factors Info  Code Status,Allergies Code Status Info: full Allergies Info: levaquin (levofloxacin in D5w), methotraxate           Current Medications (08/26/2020):  This is the current hospital active medication list Current Facility-Administered Medications  Medication Dose Route Frequency Provider Last Rate Last Admin  . albuterol (PROVENTIL) (2.5 MG/3ML) 0.083% nebulizer solution 2.5 mg  2.5 mg Nebulization Q6H PRN Sharen Hones, MD   2.5 mg at 08/22/20 2102  . Chlorhexidine Gluconate Cloth 2 % PADS 6 each  6 each Topical Q0600 Sharion Settler, NP   6 each at 08/26/20 [phone]  . enoxaparin (LOVENOX) injection 40 mg  40 mg Subcutaneous Q24H Sharen Hones, MD   40 mg at 08/25/20 2313  . feeding supplement (ENSURE  ENLIVE / ENSURE PLUS) liquid 237 mL  237 mL Oral BID BM Sharen Hones, MD   237 mL at 08/26/20 1402  . mometasone-formoterol (DULERA) 100-5 MCG/ACT inhaler 2 puff  2 puff Inhalation BID Sharen Hones, MD   2 puff at 08/26/20 1058  . mupirocin ointment (BACTROBAN) 2 % 1 application  1 application Nasal BID Sharion Settler, NP   1 application at 78/58/85 1058  . nicotine (NICODERM CQ - dosed in mg/24 hours) patch 21 mg  21 mg Transdermal Daily Sharen Hones, MD   21  mg at 08/26/20 1059  . pantoprazole (PROTONIX) EC tablet 40 mg  40 mg Oral Daily Sharen Hones, MD   40 mg at 08/26/20 1100  . piperacillin-tazobactam (ZOSYN) IVPB 3.375 g  3.375 g Intravenous Redmond Pulling, MD 12.5 mL/hr at 08/26/20 1101 3.375 g at 08/26/20 1101  . potassium chloride (KLOR-CON) packet 40 mEq  40 mEq Oral BID Sharen Hones, MD   40 mEq at 08/26/20 1101  . predniSONE (DELTASONE) tablet 10 mg  10 mg Oral BID WC Sharen Hones, MD   10 mg at 08/26/20 1101  . vancomycin (VANCOREADY) IVPB 1000 mg/200 mL  1,000 mg Intravenous Q12H Renda Rolls, RPH 200 mL/hr at 08/26/20 1359 1,000 mg at 08/26/20 1359     Discharge Medications: Please see discharge summary for a list of discharge medications.  Relevant Imaging Results:  Relevant Lab Results:   Additional Information SS #: [id number]  [place], LCSW

## 2020-08-26 NOTE — Progress Notes (Signed)
PROGRESS NOTE    Marcus [name]  WUJ:[id number] DOB: 26-Apr-1960 DOA: 08/22/2020 PCP: [name], FNP   Chief complaint.  Shortness of breath. Brief Narrative:  Marcus [name] is a 60-year-male with history of COPD, chronic hypoxemic respiratoryfailure on 2 L oxygen, rheumatoid arthritis, pancytopenia, Felty syndrome who came to the hospital with complaints of short of breath and a cough for at least a month. Upon arriving the emergency room, his CT scan showed cavitary lesion in the right upper lobe. Suspect lung abscess, he is seen by pulmonology, placed on Zosyn, also added vancomycin.   Assessment & Plan:   Active Problems:   Moderate protein-calorie malnutrition (HCC)   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HCC)   Chronic obstructive pulmonary disease (HCC)   Hypokalemia   Chronic diastolic CHF (congestive heart failure) (HCC)   Hyponatremia   Cavitary pneumonia   Acute on chronic respiratory failure with hypoxia (Clarks)   Hypothermia  #1. left upper lobe cavitary pneumonia. Patient has a positive MRSA culture, sputum culture still pending.  Blood culture negative.  Acid-fast smear negative so far.  Pending another 2 sets. Appreciate pulmonology consult, continue vancomycin and Zosyn for now.   2.  Rheumatoid arthritis. Felty syndrome. Pancytopenia. Worsening neutropenia. Hypothermia. Relative adrenal insufficiency. Patient condition more stable, body temperature has normalized.  Leukopenia is improving.  Continue higher dose of corticosteroids.  #3.  Acute on chronic hypoxemic respiratory failure. COPD. Condition improving, gradually wean down oxygen to 2 L.  No COPD exacerbation.  4.  Likely dementia. Follow-up with PCP as outpatient.  5.  Hypokalemia Continue oral supplement.   DVT prophylaxis: Lovenox Code Status: Full Family Communication:  Disposition Plan:  .   Status is: Inpatient  Remains inpatient appropriate because:Inpatient level of care appropriate due to severity  of illness   Dispo: The patient is from: Home              Anticipated d/c is to: Home              Patient currently is not medically stable to d/c.   Difficult to place patient No        I/O last 3 completed shifts: In: -  Out: 2850 [Urine:2850] Total I/O In: -  Out: 300 [Urine:300]     Consultants:   Pulmonology  Procedures: None  Antimicrobials: None  Subjective: Patient doing well today, still on 3 L oxygen, no significant short of breath. No fever or chills. No abdominal pain or nausea vomiting. No dysuria hematuria.  Objective: Vitals:   08/25/20 1540 08/25/20 1908 08/26/20 0410 08/26/20 1106  BP: 114/75 112/68 123/78 111/66  Pulse: 70 76 73 77  Resp: 18 20 20 16   Temp:  97.7 F (36.5 C) 97.6 F (36.4 C) 97.7 F (36.5 C)  TempSrc:  Oral Oral Oral  SpO2: 93% 96% 97% 94%  Weight:      Height:        Intake/Output Summary (Last 24 hours) at 08/26/2020 1317 Last data filed at 08/26/2020 1109 Gross per 24 hour  Intake --  Output 2025 ml  Net -2025 ml   Filed Weights   08/22/20 1220 08/24/20 0235  Weight: 57.1 kg 60.6 kg    Examination:  General exam: Appears calm and comfortable  Respiratory system: Clear to auscultation. Respiratory effort normal. Cardiovascular system: S1 & S2 heard, RRR. No JVD, murmurs, rubs, gallops or clicks. No pedal edema. Gastrointestinal system: Abdomen is nondistended, soft and nontender. No organomegaly or masses felt. Normal  bowel sounds heard. Central nervous system: Alert and oriented. No focal neurological deficits. Extremities: Symmetric 5 x 5 power. Skin: No rashes, lesions or ulcers Psychiatry: Judgement and insight appear normal. Mood & affect appropriate.     Data Reviewed: I have personally reviewed following labs and imaging studies  CBC: Recent Labs  Lab 08/22/20 1229 08/25/20 0527 08/25/20 0628 08/26/20 0531  WBC 3.1* 1.1* 1.1* 1.7*  NEUTROABS  --  0.2* 0.2* 0.5*  HGB 13.9 12.1* 12.2*  12.4*  HCT 43.2 38.0* 38.0* 39.2  MCV 84.5 85.4 85.0 86.2  PLT 127* 134* 136* 902*   Basic Metabolic Panel: Recent Labs  Lab 08/22/20 1229 08/24/20 0801 08/25/20 0527 08/26/20 0531  NA 132*  --  142 142  K 4.1  --  3.3* 3.3*  CL 98  --  109 107  CO2 25  --  28 29  GLUCOSE 169*  --  145* 81  BUN 27*  --  13 12  CREATININE 0.79 0.51* 0.45* 0.39*  CALCIUM 8.0*  --  7.8* 8.0*  MG  --   --  2.0 1.8   GFR: Estimated Creatinine Clearance: 85.2 mL/min (A) (by C-G formula based on SCr of 0.39 mg/dL (L)). Liver Function Tests: Recent Labs  Lab 08/22/20 1229  AST 37  ALT 18  ALKPHOS 116  BILITOT 1.1  PROT 6.6  ALBUMIN 2.0*   No results for input(s): LIPASE, AMYLASE in the last 168 hours. No results for input(s): AMMONIA in the last 168 hours. Coagulation Profile: No results for input(s): INR, PROTIME in the last 168 hours. Cardiac Enzymes: No results for input(s): CKTOTAL, CKMB, CKMBINDEX, TROPONINI in the last 168 hours. BNP (last 3 results) No results for input(s): PROBNP in the last 8760 hours. HbA1C: No results for input(s): HGBA1C in the last 72 hours. CBG: No results for input(s): GLUCAP in the last 168 hours. Lipid Profile: No results for input(s): CHOL, HDL, LDLCALC, TRIG, CHOLHDL, LDLDIRECT in the last 72 hours. Thyroid Function Tests: Recent Labs    08/25/20 0527  TSH 1.706   Anemia Panel: No results for input(s): VITAMINB12, FOLATE, FERRITIN, TIBC, IRON, RETICCTPCT in the last 72 hours. Sepsis Labs: No results for input(s): PROCALCITON, LATICACIDVEN in the last 168 hours.  Recent Results (from the past 240 hour(s))  Resp Panel by RT-PCR (Flu A&B, Covid) Nasopharyngeal Swab     Status: None   Collection Time: 08/22/20 12:45 PM   Specimen: Nasopharyngeal Swab; Nasopharyngeal(NP) swabs in vial transport medium  Result Value Ref Range Status   SARS Coronavirus 2 by RT PCR NEGATIVE NEGATIVE Final    Comment: (NOTE) SARS-CoV-2 target nucleic acids are NOT  DETECTED.  The SARS-CoV-2 RNA is generally detectable in upper respiratory specimens during the acute phase of infection. The lowest concentration of SARS-CoV-2 viral copies this assay can detect is 138 copies/mL. A negative result does not preclude SARS-Cov-2 infection and should not be used as the sole basis for treatment or other patient management decisions. A negative result may occur with  improper specimen collection/handling, submission of specimen other than nasopharyngeal swab, presence of viral mutation(s) within the areas targeted by this assay, and inadequate number of viral copies(<138 copies/mL). A negative result must be combined with clinical observations, patient history, and epidemiological information. The expected result is Negative.  Fact Sheet for Patients:  EntrepreneurPulse.com.au  Fact Sheet for Healthcare Providers:  IncredibleEmployment.be  This test is no t yet approved or cleared by the [place] and  has been authorized for detection and/or diagnosis of SARS-CoV-2 by FDA under an Emergency Use Authorization (EUA). This EUA will remain  in effect (meaning this test can be used) for the duration of the COVID-19 declaration under Section 564(b)(1) of the Act, 21 U.S.C.section 360bbb-3(b)(1), unless the authorization is terminated  or revoked sooner.       Influenza A by PCR NEGATIVE NEGATIVE Final   Influenza B by PCR NEGATIVE NEGATIVE Final    Comment: (NOTE) The Xpert Xpress SARS-CoV-2/FLU/RSV plus assay is intended as an aid in the diagnosis of influenza from Nasopharyngeal swab specimens and should not be used as a sole basis for treatment. Nasal washings and aspirates are unacceptable for Xpert Xpress SARS-CoV-2/FLU/RSV testing.  Fact Sheet for Patients: EntrepreneurPulse.com.au  Fact Sheet for Healthcare Providers: IncredibleEmployment.be  This test is not yet  approved or cleared by the [place] FDA and has been authorized for detection and/or diagnosis of SARS-CoV-2 by FDA under an Emergency Use Authorization (EUA). This EUA will remain in effect (meaning this test can be used) for the duration of the COVID-19 declaration under Section 564(b)(1) of the Act, 21 U.S.C. section 360bbb-3(b)(1), unless the authorization is terminated or revoked.  Performed at [facility], [address] [place], [place] [address]   Blood culture (routine x 2)     Status: None (Preliminary result)   Collection Time: 08/22/20  3:18 PM   Specimen: BLOOD  Result Value Ref Range Status   Specimen Description BLOOD RIGHT ANTECUBITAL  Final   Special Requests   Final    BOTTLES DRAWN AEROBIC AND ANAEROBIC Blood Culture adequate volume   Culture   Final    NO GROWTH 4 DAYS Performed at [facility], [address], [place], [place] [address]    Report Status PENDING  Incomplete  Blood culture (routine x 2)     Status: None (Preliminary result)   Collection Time: 08/22/20  3:18 PM   Specimen: BLOOD  Result Value Ref Range Status   Specimen Description BLOOD LEFT ANTECUBITAL  Final   Special Requests   Final    BOTTLES DRAWN AEROBIC AND ANAEROBIC Blood Culture adequate volume   Culture   Final    NO GROWTH 4 DAYS Performed at [facility], [address], [place], [place] [address]    Report Status PENDING  Incomplete  Expectorated Sputum Assessment w Gram Stain, Rflx to Resp Cult     Status: None   Collection Time: 08/23/20  4:15 AM   Specimen: Expectorated Sputum  Result Value Ref Range Status   Specimen Description EXPECTORATED SPUTUM  Final   Special Requests NONE  Final   Sputum evaluation   Final    THIS SPECIMEN IS ACCEPTABLE FOR SPUTUM CULTURE Performed at [facility], [address], [place], [place] [address]    Report Status 08/24/2020 FINAL  Final  Culture, Respiratory w Gram Stain     Status: None    Collection Time: 08/23/20  4:15 AM  Result Value Ref Range Status   Specimen Description   Final    EXPECTORATED SPUTUM Performed at [facility], [address], [place], [place] [address]    Special Requests   Final    NONE Reflexed from [id number] Performed at [facility], [address] [place],  [address]    Gram Stain   Final    MODERATE WBC PRESENT,BOTH PMN AND MONONUCLEAR MODERATE GRAM POSITIVE COCCI IN PAIRS IN CLUSTERS Performed at  [facility], [place] [address] [place], [place] [address]    Culture   Final    MODERATE METHICILLIN RESISTANT STAPHYLOCOCCUS AUREUS   Report Status 08/26/2020 FINAL  Final   Organism ID, Bacteria METHICILLIN RESISTANT STAPHYLOCOCCUS AUREUS  Final      Susceptibility   Methicillin resistant staphylococcus aureus - MIC*    CIPROFLOXACIN <=0.5 SENSITIVE Sensitive     ERYTHROMYCIN >=8 RESISTANT Resistant     GENTAMICIN <=0.5 SENSITIVE Sensitive     OXACILLIN >=4 RESISTANT Resistant     TETRACYCLINE <=1 SENSITIVE Sensitive     VANCOMYCIN <=0.5 SENSITIVE Sensitive     TRIMETH/SULFA <=10 SENSITIVE Sensitive     CLINDAMYCIN <=0.25 SENSITIVE Sensitive     RIFAMPIN <=0.5 SENSITIVE Sensitive     Inducible Clindamycin NEGATIVE Sensitive     * MODERATE METHICILLIN RESISTANT STAPHYLOCOCCUS AUREUS  MRSA PCR Screening     Status: Abnormal   Collection Time: 08/24/20  3:15 AM   Specimen: Nasopharyngeal  Result Value Ref Range Status   MRSA by PCR POSITIVE (A) NEGATIVE Final    Comment:        The GeneXpert MRSA Assay (FDA approved for NASAL specimens only), is one component of a comprehensive MRSA colonization surveillance program. It is not intended to diagnose MRSA infection nor to guide or monitor treatment for MRSA infections. RESULT CALLED TO, READ BACK BY AND VERIFIED WITH: MIA YOUNG AT 0530 ON 08/24/2020 [place]. Performed at [facility], [place], [place] [address]   Acid Fast Smear  (AFB)     Status: None   Collection Time: 08/24/20  4:15 AM   Specimen: Sputum  Result Value Ref Range Status   AFB Specimen Processing Concentration  Final   Acid Fast Smear Negative  Final    Comment: (NOTE) Performed At: [facility] [place], [place] [id number] Rush Farmer MD ES:[id number]    Source (AFB) EXPECTORATED SPUTUM  Final    Comment: Performed at [facility], [address] [place], [place] [address]         Radiology Studies: No results found.      Scheduled Meds: . Chlorhexidine Gluconate Cloth  6 each Topical Q0600  . enoxaparin (LOVENOX) injection  40 mg Subcutaneous Q24H  . feeding supplement  237 mL Oral BID BM  . mometasone-formoterol  2 puff Inhalation BID  . mupirocin ointment  1 application Nasal BID  . nicotine  21 mg Transdermal Daily  . pantoprazole  40 mg Oral Daily  . potassium chloride  40 mEq Oral BID  . predniSONE  10 mg Oral BID WC   Continuous Infusions: . piperacillin-tazobactam (ZOSYN)  IV 3.375 g (08/26/20 1101)  . vancomycin 1,000 mg (08/26/20 0012)     LOS: 4 days    Time spent: 27 minutes    Sharen Hones, MD [facility]   To contact the attending provider between 7A-7P or the covering provider during after hours 7P-7A, please log into the web site [email or web address] and access using universal [place] password for that web site. If you do not have the password, please call the hospital operator.  08/26/2020, 1:17 PM

## 2020-08-26 NOTE — Progress Notes (Signed)
Patient is unable to expel a moderate amount of mucous from coughing. Patient is repositioned in high fowlers and coached by this nurse to produce a deep cough. Deep cough is ineffective. Patient is given specimen cup and instructed to call when he can produce a deep cough accompanied with sputum. Pt understands and agrees to instructions.

## 2020-08-27 DIAGNOSIS — J9621 Acute and chronic respiratory failure with hypoxia: Secondary | ICD-10-CM | POA: Diagnosis not present

## 2020-08-27 DIAGNOSIS — M0500 Felty's syndrome, unspecified site: Secondary | ICD-10-CM | POA: Diagnosis not present

## 2020-08-27 DIAGNOSIS — J189 Pneumonia, unspecified organism: Secondary | ICD-10-CM | POA: Diagnosis not present

## 2020-08-27 DIAGNOSIS — J984 Other disorders of lung: Secondary | ICD-10-CM

## 2020-08-27 DIAGNOSIS — I5032 Chronic diastolic (congestive) heart failure: Secondary | ICD-10-CM | POA: Diagnosis not present

## 2020-08-27 LAB — CBC WITH DIFFERENTIAL/PLATELET
Abs Immature Granulocytes: 0.07 10*3/uL (ref 0.00–0.07)
Abs Immature Granulocytes: 0.16 10*3/uL — ABNORMAL HIGH (ref 0.00–0.07)
Basophils Absolute: 0 10*3/uL (ref 0.0–0.1)
Basophils Absolute: 0 10*3/uL (ref 0.0–0.1)
Basophils Relative: 1 %
Basophils Relative: 1 %
Eosinophils Absolute: 0 10*3/uL (ref 0.0–0.5)
Eosinophils Absolute: 0 10*3/uL (ref 0.0–0.5)
Eosinophils Relative: 0 %
Eosinophils Relative: 0 %
HCT: 38 % — ABNORMAL LOW (ref 39.0–52.0)
HCT: 41.9 % (ref 39.0–52.0)
Hemoglobin: 12.1 g/dL — ABNORMAL LOW (ref 13.0–17.0)
Hemoglobin: 13.2 g/dL (ref 13.0–17.0)
Immature Granulocytes: 6 %
Immature Granulocytes: 8 %
Lymphocytes Relative: 36 %
Lymphocytes Relative: 31 %
Lymphs Abs: 0.4 10*3/uL — ABNORMAL LOW (ref 0.7–4.0)
Lymphs Abs: 0.6 10*3/uL — ABNORMAL LOW (ref 0.7–4.0)
MCH: 27.2 pg (ref 26.0–34.0)
MCH: 26.9 pg (ref 26.0–34.0)
MCHC: 31.8 g/dL (ref 30.0–36.0)
MCHC: 31.5 g/dL (ref 30.0–36.0)
MCV: 85.4 fL (ref 80.0–100.0)
MCV: 85.5 fL (ref 80.0–100.0)
Monocytes Absolute: 0.5 10*3/uL (ref 0.1–1.0)
Monocytes Absolute: 0.5 10*3/uL (ref 0.1–1.0)
Monocytes Relative: 40 %
Monocytes Relative: 25 %
Neutro Abs: 0.2 10*3/uL — CL (ref 1.7–7.7)
Neutro Abs: 0.7 10*3/uL — ABNORMAL LOW (ref 1.7–7.7)
Neutrophils Relative %: 17 %
Neutrophils Relative %: 35 %
Platelets: 134 10*3/uL — ABNORMAL LOW (ref 150–400)
Platelets: 145 10*3/uL — ABNORMAL LOW (ref 150–400)
RBC: 4.45 MIL/uL (ref 4.22–5.81)
RBC: 4.9 MIL/uL (ref 4.22–5.81)
RDW: 17.8 % — ABNORMAL HIGH (ref 11.5–15.5)
RDW: 18.5 % — ABNORMAL HIGH (ref 11.5–15.5)
Smear Review: NORMAL
Smear Review: NORMAL
WBC: 1.1 10*3/uL — CL (ref 4.0–10.5)
WBC: 2 10*3/uL — ABNORMAL LOW (ref 4.0–10.5)
nRBC: 3.5 % — ABNORMAL HIGH (ref 0.0–0.2)
nRBC: 1 % — ABNORMAL HIGH (ref 0.0–0.2)

## 2020-08-27 LAB — BASIC METABOLIC PANEL
Anion gap: 6 (ref 5–15)
BUN: 13 mg/dL (ref 6–20)
CO2: 31 mmol/L (ref 22–32)
Calcium: 8 mg/dL — ABNORMAL LOW (ref 8.9–10.3)
Chloride: 106 mmol/L (ref 98–111)
Creatinine, Ser: 0.5 mg/dL — ABNORMAL LOW (ref 0.61–1.24)
GFR, Estimated: >60 mL/min (ref >60)
Glucose, Bld: 105 mg/dL — ABNORMAL HIGH (ref 70–99)
Potassium: 3.7 mmol/L (ref 3.5–5.1)
Sodium: 143 mmol/L (ref 135–145)

## 2020-08-27 LAB — MAGNESIUM: Magnesium: 1.7 mg/dL (ref 1.7–2.4)

## 2020-08-27 LAB — CULTURE, BLOOD (ROUTINE X 2)
Culture: NO GROWTH
Culture: NO GROWTH
Special Requests: ADEQUATE
Special Requests: ADEQUATE

## 2020-08-27 LAB — QUANTIFERON-TB GOLD PLUS (RQFGPL)
QuantiFERON Mitogen Value: 1.09 IU/mL
QuantiFERON Nil Value: 0.12 IU/mL
QuantiFERON TB1 Ag Value: 0.14 IU/mL
QuantiFERON TB2 Ag Value: 0.12 IU/mL

## 2020-08-27 LAB — QUANTIFERON-TB GOLD PLUS: QuantiFERON-TB Gold Plus: NEGATIVE

## 2020-08-27 MED ORDER — DOXYCYCLINE HYCLATE 100 MG PO TABS
100.0000 mg | ORAL_TABLET | Freq: Two times a day (BID) | ORAL | Status: DC
  Administered 2020-08-27 – 2020-08-30 (×6): 100 mg via ORAL
  Filled 2020-08-27 (×6): qty 1

## 2020-08-27 NOTE — Consult Note (Signed)
[name] for Infectious Disease       Reason for Consult: cavitary pneumonia    Referring Physician: Dr. Roosevelt Locks  Active Problems:   Moderate protein-calorie malnutrition (HCC)   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HCC)   Chronic obstructive pulmonary disease (HCC)   Hypokalemia   Chronic diastolic CHF (congestive heart failure) (HCC)   Hyponatremia   Cavitary pneumonia   Acute on chronic respiratory failure with hypoxia (HCC)   Hypothermia   . Chlorhexidine Gluconate Cloth  6 each Topical Q0600  . enoxaparin (LOVENOX) injection  40 mg Subcutaneous Q24H  . feeding supplement  237 mL Oral BID BM  . mometasone-formoterol  2 puff Inhalation BID  . mupirocin ointment  1 application Nasal BID  . nicotine  21 mg Transdermal Daily  . pantoprazole  40 mg Oral Daily  . predniSONE  10 mg Oral BID WC    Recommendations: doxycycline 100 mg twice a day for 1 month Follow up CXR in about 3-4 weeks Will arrange follow up with Dr. Delaine Lame in 3-4 weeks  Assessment: He has cavitary pneumonia positive for MRSA which is new compared to a CT 2 months prior.  Will need prolonged treatment of 3-4 weeks with a repeat CXR to check for resolution or if his course needs to be extended then.    Antibiotics: Vancomycin and piperacillin tazobactam  HPI: Marcus [name] is a 60 y.o. male with Felty syndrome, COPD, chronic hypoxic respiratory failure on chronic oxygen at home, RA came in with several weeks of progressively worsening cough.  He continues to smoke.  He takes care of his 17 year old mother at home and is anxious to be discharged home. CT scan of his chest noted a right upper lobe cavitary infiltrate and sputum culture positive for MRSA.  Blood cultures have remained negative.  He is pancytopenic at baseline.  He has been afebrile.     Review of Systems:  Constitutional: negative for fevers and chills Gastrointestinal: negative for nausea and diarrhea All other systems reviewed and are negative     Past Medical History:  Diagnosis Date  . COPD (chronic obstructive pulmonary disease) ([place])   . Felty syndrome ([place])   . Hernia, epigastric   . Pancytopenia ([place])   . Seropositive rheumatoid arthritis ([place])   . Tobacco use disorder     Social History   Tobacco Use  . Smoking status: Current Every Day Smoker    Packs/day: 1.00    Types: Cigarettes  . Smokeless tobacco: Never Used  Substance Use Topics  . Alcohol use: No    Alcohol/week: 0.0 standard drinks  . Drug use: Yes    Types: Marijuana    Family History  Problem Relation Age of Onset  . CAD Brother   . COPD Brother     Allergies  Allergen Reactions  . Levaquin [Levofloxacin In D5w] Other (See Comments)    Chest pain  . Methotrexate Other (See Comments)    Chest pain    Physical Exam: Constitutional: in no apparent distress  Vitals:   08/27/20 1137 08/27/20 1510  BP: 129/83 118/73  Pulse: 76 78  Resp: (!) 24 (!) 24  Temp: 97.6 F (36.4 C) 97.6 F (36.4 C)  SpO2: 96% 95%   EYES: anicteric Cardiovascular: Cor RRR Respiratory: diffuse rhonchi; normal respiratory effort Musculoskeletal: thin/muscle wasting upper extremities GI: protruded abdomen, soft, not distended Skin: negatives: no rash Neuro: non-focal  Lab Results  Component Value Date   WBC 2.0 (L)  08/27/2020   HGB 13.2 08/27/2020   HCT 41.9 08/27/2020   MCV 85.5 08/27/2020   PLT 145 (L) 08/27/2020    Lab Results  Component Value Date   CREATININE 0.50 (L) 08/27/2020   BUN 13 08/27/2020   NA 143 08/27/2020   K 3.7 08/27/2020   CL 106 08/27/2020   CO2 31 08/27/2020    Lab Results  Component Value Date   ALT 18 08/22/2020   AST 37 08/22/2020   ALKPHOS 116 08/22/2020     Microbiology: Recent Results (from the past 240 hour(s))  Resp Panel by RT-PCR (Flu A&B, Covid) Nasopharyngeal Swab     Status: None   Collection Time: 08/22/20 12:45 PM   Specimen: Nasopharyngeal Swab; Nasopharyngeal(NP) swabs in vial transport medium   Result Value Ref Range Status   SARS Coronavirus 2 by RT PCR NEGATIVE NEGATIVE Final    Comment: (NOTE) SARS-CoV-2 target nucleic acids are NOT DETECTED.  The SARS-CoV-2 RNA is generally detectable in upper respiratory specimens during the acute phase of infection. The lowest concentration of SARS-CoV-2 viral copies this assay can detect is 138 copies/mL. A negative result does not preclude SARS-Cov-2 infection and should not be used as the sole basis for treatment or other patient management decisions. A negative result may occur with  improper specimen collection/handling, submission of specimen other than nasopharyngeal swab, presence of viral mutation(s) within the areas targeted by this assay, and inadequate number of viral copies(<138 copies/mL). A negative result must be combined with clinical observations, patient history, and epidemiological information. The expected result is Negative.  Fact Sheet for Patients:  EntrepreneurPulse.com.au  Fact Sheet for Healthcare Providers:  IncredibleEmployment.be  This test is no t yet approved or cleared by the [place] FDA and  has been authorized for detection and/or diagnosis of SARS-CoV-2 by FDA under an Emergency Use Authorization (EUA). This EUA will remain  in effect (meaning this test can be used) for the duration of the COVID-19 declaration under Section 564(b)(1) of the Act, 21 U.S.C.section 360bbb-3(b)(1), unless the authorization is terminated  or revoked sooner.       Influenza A by PCR NEGATIVE NEGATIVE Final   Influenza B by PCR NEGATIVE NEGATIVE Final    Comment: (NOTE) The Xpert Xpress SARS-CoV-2/FLU/RSV plus assay is intended as an aid in the diagnosis of influenza from Nasopharyngeal swab specimens and should not be used as a sole basis for treatment. Nasal washings and aspirates are unacceptable for Xpert Xpress SARS-CoV-2/FLU/RSV testing.  Fact Sheet for  Patients: EntrepreneurPulse.com.au  Fact Sheet for Healthcare Providers: IncredibleEmployment.be  This test is not yet approved or cleared by the [place] FDA and has been authorized for detection and/or diagnosis of SARS-CoV-2 by FDA under an Emergency Use Authorization (EUA). This EUA will remain in effect (meaning this test can be used) for the duration of the COVID-19 declaration under Section 564(b)(1) of the Act, 21 U.S.C. section 360bbb-3(b)(1), unless the authorization is terminated or revoked.  Performed at [facility], [address] [place], [place] [address]   Blood culture (routine x 2)     Status: None   Collection Time: 08/22/20  3:18 PM   Specimen: BLOOD  Result Value Ref Range Status   Specimen Description BLOOD RIGHT ANTECUBITAL  Final   Special Requests   Final    BOTTLES DRAWN AEROBIC AND ANAEROBIC Blood Culture adequate volume   Culture   Final    NO GROWTH 5 DAYS Performed at [facility], [address] [place], [place] [address]    Report Status 08/27/2020 FINAL  Final  Blood culture (routine x 2)     Status: None   Collection Time: 08/22/20  3:18 PM   Specimen: BLOOD  Result Value Ref Range Status   Specimen Description BLOOD LEFT ANTECUBITAL  Final   Special Requests   Final    BOTTLES DRAWN AEROBIC AND ANAEROBIC Blood Culture adequate volume   Culture   Final    NO GROWTH 5 DAYS Performed at [facility], [address] [place], [place] [address]    Report Status 08/27/2020 FINAL  Final  Expectorated Sputum Assessment w Gram Stain, Rflx to Resp Cult     Status: None   Collection Time: 08/23/20  4:15 AM   Specimen: Expectorated Sputum  Result Value Ref Range Status   Specimen Description EXPECTORATED SPUTUM  Final   Special Requests NONE  Final   Sputum evaluation   Final    THIS SPECIMEN IS ACCEPTABLE FOR SPUTUM CULTURE Performed at [facility], [address] [place], [place] [address]    Report Status 08/24/2020 FINAL  Final  Culture, Respiratory w Gram Stain     Status: None   Collection Time: 08/23/20  4:15 AM  Result Value Ref Range Status   Specimen Description   Final    EXPECTORATED SPUTUM Performed at [facility], [address], [place], [place] [address]    Special Requests   Final    NONE Reflexed from [id number] Performed at [facility], [address] [place], [place] [address]    Gram Stain   Final    MODERATE WBC PRESENT,BOTH PMN AND MONONUCLEAR MODERATE GRAM POSITIVE COCCI IN PAIRS IN CLUSTERS Performed at [facility], [place] [address], [place], [place] [address]    Culture   Final    MODERATE METHICILLIN RESISTANT STAPHYLOCOCCUS AUREUS   Report Status 08/26/2020 FINAL  Final   Organism ID, Bacteria METHICILLIN RESISTANT STAPHYLOCOCCUS AUREUS  Final      Susceptibility   Methicillin resistant staphylococcus aureus - MIC*    CIPROFLOXACIN <=0.5 SENSITIVE Sensitive     ERYTHROMYCIN >=8 RESISTANT Resistant     GENTAMICIN <=0.5 SENSITIVE Sensitive     OXACILLIN >=4 RESISTANT Resistant     TETRACYCLINE <=1 SENSITIVE Sensitive     VANCOMYCIN <=0.5 SENSITIVE Sensitive     TRIMETH/SULFA <=10 SENSITIVE Sensitive     CLINDAMYCIN <=0.25 SENSITIVE Sensitive     RIFAMPIN <=0.5 SENSITIVE Sensitive     Inducible Clindamycin NEGATIVE Sensitive     * MODERATE METHICILLIN RESISTANT STAPHYLOCOCCUS AUREUS  MRSA PCR Screening     Status: Abnormal   Collection Time: 08/24/20  3:15 AM   Specimen: Nasopharyngeal  Result Value Ref Range Status   MRSA by PCR POSITIVE (A) NEGATIVE Final    Comment:        The GeneXpert MRSA Assay (FDA approved for NASAL specimens only), is one component of a comprehensive MRSA colonization surveillance program. It is not intended to diagnose MRSA infection nor to guide or monitor treatment for MRSA infections. RESULT CALLED TO, READ BACK BY AND VERIFIED WITH: MIA YOUNG  AT 0530 ON 08/24/2020 [place]. Performed at [facility], [place], [place] [address]   Acid Fast Smear (AFB)     Status: None   Collection Time: 08/24/20  4:15 AM   Specimen: Sputum  Result Value Ref Range Status   AFB Specimen Processing Concentration  Final   Acid Fast Smear Negative  Final    Comment: (NOTE) Performed At: [facility], [place] [id number] Rush Farmer MD [id number]    Source (AFB) EXPECTORATED SPUTUM  Final    Comment: Performed at [facility], [address] [place], [place] [address]    Thayer Headings, [place] for Infectious Disease [facility] [email or web address] 08/27/2020, 5:00 PM

## 2020-08-27 NOTE — Progress Notes (Signed)
PROGRESS NOTE    Marcus [name]  EAV:[id number] DOB: Dec 12, 1960 DOA: 08/22/2020 PCP: [name], FNP    Brief Narrative:  Marcus [name] is a 60-year-male with history of COPD, chronic hypoxemic respiratoryfailure on 2 L oxygen, rheumatoid arthritis, pancytopenia, Felty syndrome who came to the hospital with complaints of short of breath and a cough for at least a month. Upon arriving the emergency room, his CT scan showed cavitary lesion in the right upper lobe. Suspect lung abscess, he is seen by pulmonology, placed on Zosyn, also added vancomycin. 5/23.  Sputum culture came back with MRSA, continue Vanco, discontinue Zosyn.  Obtain echocardiogram.  Blood culture negative.  Assessment & Plan:   Active Problems:   Moderate protein-calorie malnutrition (HCC)   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HCC)   Chronic obstructive pulmonary disease (HCC)   Hypokalemia   Chronic diastolic CHF (congestive heart failure) (HCC)   Hyponatremia   Cavitary pneumonia   Acute on chronic respiratory failure with hypoxia ([place])   Hypothermia  #1.  Left upper lobe MRSA pneumonia with abscess. Patient blood culture has been negative, sputum culture came back with MRSA.  Condition most likely is MRSA pneumonia with abscess.  Continue vancomycin. Obtain consult from ID for antibiotic guidance. Discussed with Dr. Raul Del, patient CT scan of the chest did show some evidence of multifocal abscess/pneumonia, but blood culture has been negative.  Will obtain echocardiogram to evaluate, possibility of endocarditis is low due to negative blood culture.   2. Rheumatoid arthritis. Felty syndrome. Pancytopenia. Worsening neutropenia. Hypothermia. Relative adrenal insufficiency. Condition more stabilized.  Leukopenia getting better, white cell 2.0.  Continue to follow-up  3.  Acute on chronic hypoxemic respiratory failure. COPD. Condition gradually improving.  No bronchospasm.  Continue wean off oxygen to 2 L.  4.   Hypokalemia. Improved  5.  Likely dementia.    DVT prophylaxis: Lovenox Code Status: Full Family Communication:  Disposition Plan:  .   Status is: Inpatient  Remains inpatient appropriate because:Inpatient level of care appropriate due to severity of illness   Dispo: The patient is from: Home              Anticipated d/c is to: Home              Patient currently is not medically stable to d/c.   Difficult to place patient No        I/O last 3 completed shifts: In: 890.1 [IV Piggyback:890.1] Out: 52 [Urine:3300] Total I/O In: -  Out: 1300 [Urine:1300]     Consultants:   Pulm, ID  Procedures: None  Antimicrobials: Vancomycin.  Subjective: Patient doing better, does not have significant confusion today. Still on 3 L oxygen, no short of breath.  Still cough with white mucus. No chest pain or palpitation. No fever or chills. No dysuria hematuria.  Objective: Vitals:   08/26/20 1106 08/26/20 1906 08/27/20 0414 08/27/20 1137  BP: 111/66 133/83 127/82 129/83  Pulse: 77 77 79 76  Resp: 16 16 18  (!) 24  Temp: 97.7 F (36.5 C) 97.7 F (36.5 C) 97.6 F (36.4 C) 97.6 F (36.4 C)  TempSrc: Oral Oral Oral Oral  SpO2: 94% 97% 96% 96%  Weight:      Height:        Intake/Output Summary (Last 24 hours) at 08/27/2020 1402 Last data filed at 08/27/2020 1137 Gross per 24 hour  Intake 890.07 ml  Output 3200 ml  Net -2309.93 ml   Filed Weights   08/22/20 1220 08/24/20  0235  Weight: 57.1 kg 60.6 kg    Examination:  General exam: Appears calm and comfortable  Respiratory system: Decreased breathing sounds. Respiratory effort normal. Cardiovascular system: S1 & S2 heard, RRR. No JVD, murmurs, rubs, gallops or clicks. No pedal edema. Gastrointestinal system: Abdomen is nondistended, soft and nontender. No organomegaly or masses felt. Normal bowel sounds heard. Central nervous system: Alert and oriented. No focal neurological deficits. Extremities:  Symmetric 5 x 5 power. Skin: No rashes, lesions or ulcers Psychiatry: Judgement and insight appear normal. Mood & affect appropriate.     Data Reviewed: I have personally reviewed following labs and imaging studies  CBC: Recent Labs  Lab 08/22/20 1229 08/25/20 0527 08/25/20 0628 08/26/20 0531 08/27/20 0409  WBC 3.1* 1.1* 1.1* 1.7* 2.0*  NEUTROABS  --  0.2* 0.2* 0.5* 0.7*  HGB 13.9 12.1* 12.2* 12.4* 13.2  HCT 43.2 38.0* 38.0* 39.2 41.9  MCV 84.5 85.4 85.0 86.2 85.5  PLT 127* 134* 136* 130* 035*   Basic Metabolic Panel: Recent Labs  Lab 08/22/20 1229 08/24/20 0801 08/25/20 0527 08/26/20 0531 08/27/20 0409  NA 132*  --  142 142 143  K 4.1  --  3.3* 3.3* 3.7  CL 98  --  109 107 106  CO2 25  --  28 29 31   GLUCOSE 169*  --  145* 81 105*  BUN 27*  --  13 12 13   CREATININE 0.79 0.51* 0.45* 0.39* 0.50*  CALCIUM 8.0*  --  7.8* 8.0* 8.0*  MG  --   --  2.0 1.8 1.7   GFR: Estimated Creatinine Clearance: 85.2 mL/min (A) (by C-G formula based on SCr of 0.5 mg/dL (L)). Liver Function Tests: Recent Labs  Lab 08/22/20 1229  AST 37  ALT 18  ALKPHOS 116  BILITOT 1.1  PROT 6.6  ALBUMIN 2.0*   No results for input(s): LIPASE, AMYLASE in the last 168 hours. No results for input(s): AMMONIA in the last 168 hours. Coagulation Profile: No results for input(s): INR, PROTIME in the last 168 hours. Cardiac Enzymes: No results for input(s): CKTOTAL, CKMB, CKMBINDEX, TROPONINI in the last 168 hours. BNP (last 3 results) No results for input(s): PROBNP in the last 8760 hours. HbA1C: No results for input(s): HGBA1C in the last 72 hours. CBG: No results for input(s): GLUCAP in the last 168 hours. Lipid Profile: No results for input(s): CHOL, HDL, LDLCALC, TRIG, CHOLHDL, LDLDIRECT in the last 72 hours. Thyroid Function Tests: Recent Labs    08/25/20 0527  TSH 1.706   Anemia Panel: No results for input(s): VITAMINB12, FOLATE, FERRITIN, TIBC, IRON, RETICCTPCT in the last 72  hours. Sepsis Labs: No results for input(s): PROCALCITON, LATICACIDVEN in the last 168 hours.  Recent Results (from the past 240 hour(s))  Resp Panel by RT-PCR (Flu A&B, Covid) Nasopharyngeal Swab     Status: None   Collection Time: 08/22/20 12:45 PM   Specimen: Nasopharyngeal Swab; Nasopharyngeal(NP) swabs in vial transport medium  Result Value Ref Range Status   SARS Coronavirus 2 by RT PCR NEGATIVE NEGATIVE Final    Comment: (NOTE) SARS-CoV-2 target nucleic acids are NOT DETECTED.  The SARS-CoV-2 RNA is generally detectable in upper respiratory specimens during the acute phase of infection. The lowest concentration of SARS-CoV-2 viral copies this assay can detect is 138 copies/mL. A negative result does not preclude SARS-Cov-2 infection and should not be used as the sole basis for treatment or other patient management decisions. A negative result may occur with  improper specimen  collection/handling, submission of specimen other than nasopharyngeal swab, presence of viral mutation(s) within the areas targeted by this assay, and inadequate number of viral copies(<138 copies/mL). A negative result must be combined with clinical observations, patient history, and epidemiological information. The expected result is Negative.  Fact Sheet for Patients:  EntrepreneurPulse.com.au  Fact Sheet for Healthcare Providers:  IncredibleEmployment.be  This test is no t yet approved or cleared by the [place] FDA and  has been authorized for detection and/or diagnosis of SARS-CoV-2 by FDA under an Emergency Use Authorization (EUA). This EUA will remain  in effect (meaning this test can be used) for the duration of the COVID-19 declaration under Section 564(b)(1) of the Act, 21 U.S.C.section 360bbb-3(b)(1), unless the authorization is terminated  or revoked sooner.       Influenza A by PCR NEGATIVE NEGATIVE Final   Influenza B by PCR NEGATIVE  NEGATIVE Final    Comment: (NOTE) The Xpert Xpress SARS-CoV-2/FLU/RSV plus assay is intended as an aid in the diagnosis of influenza from Nasopharyngeal swab specimens and should not be used as a sole basis for treatment. Nasal washings and aspirates are unacceptable for Xpert Xpress SARS-CoV-2/FLU/RSV testing.  Fact Sheet for Patients: EntrepreneurPulse.com.au  Fact Sheet for Healthcare Providers: IncredibleEmployment.be  This test is not yet approved or cleared by the [place] FDA and has been authorized for detection and/or diagnosis of SARS-CoV-2 by FDA under an Emergency Use Authorization (EUA). This EUA will remain in effect (meaning this test can be used) for the duration of the COVID-19 declaration under Section 564(b)(1) of the Act, 21 U.S.C. section 360bbb-3(b)(1), unless the authorization is terminated or revoked.  Performed at [facility], [address] [place], [place] [address]   Blood culture (routine x 2)     Status: None (Preliminary result)   Collection Time: 08/22/20  3:18 PM   Specimen: BLOOD  Result Value Ref Range Status   Specimen Description BLOOD RIGHT ANTECUBITAL  Final   Special Requests   Final    BOTTLES DRAWN AEROBIC AND ANAEROBIC Blood Culture adequate volume   Culture   Final    NO GROWTH 4 DAYS Performed at [facility], [address] [place], [place] [address]    Report Status PENDING  Incomplete  Blood culture (routine x 2)     Status: None (Preliminary result)   Collection Time: 08/22/20  3:18 PM   Specimen: BLOOD  Result Value Ref Range Status   Specimen Description BLOOD LEFT ANTECUBITAL  Final   Special Requests   Final    BOTTLES DRAWN AEROBIC AND ANAEROBIC Blood Culture adequate volume   Culture   Final    NO GROWTH 4 DAYS Performed at [facility], [address] [place], [place] [address]    Report Status PENDING  Incomplete  Expectorated Sputum  Assessment w Gram Stain, Rflx to Resp Cult     Status: None   Collection Time: 08/23/20  4:15 AM   Specimen: Expectorated Sputum  Result Value Ref Range Status   Specimen Description EXPECTORATED SPUTUM  Final   Special Requests NONE  Final   Sputum evaluation   Final    THIS SPECIMEN IS ACCEPTABLE FOR SPUTUM CULTURE Performed at [facility], [address], [place], [place] [address]    Report Status 08/24/2020 FINAL  Final  Culture, Respiratory w Gram Stain     Status: None   Collection Time: 08/23/20  4:15 AM  Result Value Ref Range Status  Specimen Description   Final    EXPECTORATED SPUTUM Performed at [facility], [address] [place], [place] [address]    Special Requests   Final    NONE Reflexed from [id number] Performed at [facility], [place], [place] [address]    Gram Stain   Final    MODERATE WBC PRESENT,BOTH PMN AND MONONUCLEAR MODERATE GRAM POSITIVE COCCI IN PAIRS IN CLUSTERS Performed at [facility], [place] [address], [place], [place] [address]    Culture   Final    MODERATE METHICILLIN RESISTANT STAPHYLOCOCCUS AUREUS   Report Status 08/26/2020 FINAL  Final   Organism ID, Bacteria METHICILLIN RESISTANT STAPHYLOCOCCUS AUREUS  Final      Susceptibility   Methicillin resistant staphylococcus aureus - MIC*    CIPROFLOXACIN <=0.5 SENSITIVE Sensitive     ERYTHROMYCIN >=8 RESISTANT Resistant     GENTAMICIN <=0.5 SENSITIVE Sensitive     OXACILLIN >=4 RESISTANT Resistant     TETRACYCLINE <=1 SENSITIVE Sensitive     VANCOMYCIN <=0.5 SENSITIVE Sensitive     TRIMETH/SULFA <=10 SENSITIVE Sensitive     CLINDAMYCIN <=0.25 SENSITIVE Sensitive     RIFAMPIN <=0.5 SENSITIVE Sensitive     Inducible Clindamycin NEGATIVE Sensitive     * MODERATE METHICILLIN RESISTANT STAPHYLOCOCCUS AUREUS  MRSA PCR Screening     Status: Abnormal   Collection Time: 08/24/20  3:15 AM   Specimen: Nasopharyngeal  Result Value Ref Range Status    MRSA by PCR POSITIVE (A) NEGATIVE Final    Comment:        The GeneXpert MRSA Assay (FDA approved for NASAL specimens only), is one component of a comprehensive MRSA colonization surveillance program. It is not intended to diagnose MRSA infection nor to guide or monitor treatment for MRSA infections. RESULT CALLED TO, READ BACK BY AND VERIFIED WITH: MIA YOUNG AT 0530 ON 08/24/2020 [place]. Performed at [facility], [place], [place] [address]   Acid Fast Smear (AFB)     Status: None   Collection Time: 08/24/20  4:15 AM   Specimen: Sputum  Result Value Ref Range Status   AFB Specimen Processing Concentration  Final   Acid Fast Smear Negative  Final    Comment: (NOTE) Performed At: [facility] [place], [place] [id number] Rush Farmer MD [id number]    Source (AFB) EXPECTORATED SPUTUM  Final    Comment: Performed at [facility], [address], [place], [place] [address]         Radiology Studies: No results found.      Scheduled Meds: . Chlorhexidine Gluconate Cloth  6 each Topical Q0600  . enoxaparin (LOVENOX) injection  40 mg Subcutaneous Q24H  . feeding supplement  237 mL Oral BID BM  . mometasone-formoterol  2 puff Inhalation BID  . mupirocin ointment  1 application Nasal BID  . nicotine  21 mg Transdermal Daily  . pantoprazole  40 mg Oral Daily  . predniSONE  10 mg Oral BID WC   Continuous Infusions: . vancomycin 1,000 mg (08/27/20 0035)     LOS: 5 days    Time spent: 32 minutes    Sharen Hones, MD [facility]   To contact the attending provider between 7A-7P or the covering provider during after hours 7P-7A, please log into the web site [email or web address] and access using universal [place] password for that web site. If you do not have the password, please call the hospital operator.  08/27/2020, 2:02 PM

## 2020-08-27 NOTE — Progress Notes (Signed)
Pulmonary Medicine          Date: 08/27/2020,    HISTORY OF PRESENT ILLNESS   Sitting in bed, more talkative, on 5 liters 02 via [place]. Sputum + MRSE, blood cultures negative.    PAST MEDICAL HISTORY   Past Medical History:  Diagnosis Date  . COPD (chronic obstructive pulmonary disease) ([place])   . Felty syndrome ([place])   . Hernia, epigastric   . Pancytopenia ([place])   . Seropositive rheumatoid arthritis ([place])   . Tobacco use disorder      SURGICAL HISTORY   Past Surgical History:  Procedure Laterality Date  . INCISIONAL HERNIA REPAIR  01/17/2020   Procedure: HERNIA REPAIR INCISIONAL AND Silvestre Gunner;  Surgeon: Kinsinger, Arta Bruce, MD;  Location: [facility];  Service: General;;  . LAPAROTOMY N/A 01/17/2020   Procedure: EXPLORATORY LAPAROTOMY;  Surgeon: Kieth Brightly Arta Bruce, MD;  Location: [facility];  Service: General;  Laterality: N/A;     FAMILY HISTORY   Family History  Problem Relation Age of Onset  . CAD Brother   . COPD Brother      SOCIAL HISTORY   Social History   Tobacco Use  . Smoking status: Current Every Day Smoker    Packs/day: 1.00    Types: Cigarettes  . Smokeless tobacco: Never Used  Substance Use Topics  . Alcohol use: No    Alcohol/week: 0.0 standard drinks  . Drug use: Yes    Types: Marijuana     MEDICATIONS    Home Medication:    Current Medication:  Current Facility-Administered Medications:  .  albuterol (PROVENTIL) (2.5 MG/3ML) 0.083% nebulizer solution 2.5 mg, 2.5 mg, Nebulization, Q6H PRN, Sharen Hones, MD, 2.5 mg at 08/22/20 2102 .  Chlorhexidine Gluconate Cloth 2 % PADS 6 each, 6 each, Topical, Q0600, Sharion Settler, NP, 6 each at 08/27/20 [phone] .  enoxaparin (LOVENOX) injection 40 mg, 40 mg, Subcutaneous, Q24H, Sharen Hones, MD, 40 mg at 08/26/20 2007 .  feeding supplement (ENSURE ENLIVE / ENSURE PLUS) liquid 237 mL, 237 mL, Oral, BID BM, Sharen Hones, MD, 237 mL at 08/27/20 1444 .  mometasone-formoterol (DULERA)  100-5 MCG/ACT inhaler 2 puff, 2 puff, Inhalation, BID, Sharen Hones, MD, 2 puff at 08/27/20 0820 .  mupirocin ointment (BACTROBAN) 2 % 1 application, 1 application, Nasal, BID, Sharion Settler, NP, 1 application at 95/63/87 0820 .  nicotine (NICODERM CQ - dosed in mg/24 hours) patch 21 mg, 21 mg, Transdermal, Daily, Sharen Hones, MD, 21 mg at 08/27/20 5643 .  pantoprazole (PROTONIX) EC tablet 40 mg, 40 mg, Oral, Daily, Sharen Hones, MD, 40 mg at 08/27/20 0819 .  predniSONE (DELTASONE) tablet 10 mg, 10 mg, Oral, BID WC, Sharen Hones, MD, 10 mg at 08/27/20 0819 .  vancomycin (VANCOREADY) IVPB 1000 mg/200 mL, 1,000 mg, Intravenous, Q12H, Renda Rolls, RPH, Last Rate: 200 mL/hr at 08/27/20 1445, 1,000 mg at 08/27/20 1445    ALLERGIES   Levaquin [levofloxacin in d5w] and Methotrexate     REVIEW OF SYSTEMS    Review of Systems:  Gen:  Denies  fever, sweats, chills weigh loss  HEENT: Denies blurred vision, double vision, ear pain, eye pain, hearing loss, nose bleeds, sore throat Cardiac:  No dizziness, chest pain or heaviness, chest tightness,edema Resp:   Mild  cough or sputum porduction, less shortness of breath,wheezing, hemoptysis,  Gi: Denies swallowing difficulty, stomach pain, nausea or vomiting, diarrhea, constipation, bowel incontinence Gu:  Denies bladder incontinence, burning urine Ext:  Denies Joint pain, stiffness or swelling Skin: Denies  skin rash, easy bruising or bleeding or hives Endoc:  Denies polyuria, polydipsia , polyphagia or weight change Psych:   Denies depression, insomnia or hallucinations   Other:  All other systems negative   VS: BP 129/83 (BP Location: Right Arm)   Pulse 76   Temp 97.6 F (36.4 C) (Oral)   Resp (!) 24   Ht 5\' 6"  (1.676 m)   Wt 60.6 kg   SpO2 96%   BMI 21.56 kg/m      PHYSICAL EXAM    GENERAL:NAD, sitting up in bed, comfortable on 5 liters 02 via [place], thin, more engaging HEAD: Normocephalic, atraumatic.  EYES: Pupils  equal, round, reactive to light. Extraocular muscles intact. No scleral icterus.  MOUTH: Moist mucosal membrane. Dentition intact. No abscess noted.  EAR, NOSE, THROAT: Clear without exudates. No external lesions.  NECK: Supple. No thyromegaly. No nodules. No JVD.  PULMONARY: Diffuse coarse  Breath sounds CARDIOVASCULAR: S1 and S2. Regular rate and rhythm. No murmurs, rubs, or gallops. No edema. Pedal pulses 2+ bilaterally.  GASTROINTESTINAL: Soft, nontender, nondistended. No masses. Positive bowel sounds. No hepatosplenomegaly.  MUSCULOSKELETAL: No swelling, clubbing, or edema. Range of motion full in all extremities.  NEUROLOGIC: Cranial nerves II through XII are intact. No gross focal neurological deficits. Sensation intact. Reflexes intact.  SKIN: No ulceration, lesions, rashes, or cyanosis. Skin warm and dry. Turgor intact.  PSYCHIATRIC: Mood, affect within normal limits. The patient is awake, alert and oriented x 3. Insight, judgment intact.       IMAGING    CT Chest Wo Contrast  Result Date: 08/22/2020 CLINICAL DATA:  Abnormal x-ray. Interstitial infiltrates with possible right perihilar cavitation. Cough and shortness of breath. EXAM: CT CHEST WITHOUT CONTRAST TECHNIQUE: Multidetector CT imaging of the chest was performed following the standard protocol without IV contrast. COMPARISON:  Radiographs 08/22/2020 and 08/04/2020.  CT 06/10/2020. FINDINGS: Cardiovascular: Extensive three-vessel coronary artery atherosclerosis with lesser involvement of the aorta and great vessels. The heart size is normal. There is no pericardial effusion. Mediastinum/Nodes: Multiple small mediastinal and hilar lymph nodes have enlarged compared with the chest CT of 2 months ago, likely reactive. No axillary lymphadenopathy. The thyroid gland, trachea and esophagus demonstrate no significant findings. Lungs/Pleura: There is no pleural effusion or pneumothorax. Moderate centrilobular and paraseptal emphysema. As  seen on the earlier radiographs, there is a new cavitary mass inferomedially in the right upper lobe, measuring up to 7.8 x 5.5 cm on image 79/4. This has irregular thick walls and associated air-fluid levels. There was no abnormality in this area on the relatively recent prior chest CT, and this is most consistent with cavitating pneumonia. There is an additional cavitary lesion with an air-fluid level medially in the left upper lobe, measuring up to 2.4 cm on image 65/4. There is mild patchy airspace disease surrounding these cavitary infiltrates. Upper abdomen: Small calcified gallstones. No evidence of biliary dilatation or adrenal mass. Postsurgical changes in the anterior abdominal wall consistent with previous hernia repair. Musculoskeletal/Chest wall: There is no chest wall mass or suspicious osseous finding. There are old fractures of the right 5th and 6th ribs posterolaterally which appear unchanged. IMPRESSION: 1. CT confirms the presence of a cavitary infiltrate in the right upper lobe and shows a smaller area of cavitation anteriorly in the left upper lobe. No abnormalities were present in these areas on CT performed 2 months ago to suggest cavitary neoplasm, and these findings are consistent with  cavitating/necrotizing pneumonia. 2. Mildly enlarged mediastinal and hilar lymph nodes, likely reactive. 3. No associated pleural disease. 4. Cholelithiasis. 5. Underlying moderate Emphysema (ICD10-J43.9). 6. Coronary and Aortic Atherosclerosis (ICD10-I70.0). Electronically Signed   By: Richardean Sale M.D.   On: 08/22/2020 14:26   DG Chest Portable 1 View  Result Date: 08/22/2020 CLINICAL DATA:  Shortness of breath EXAM: PORTABLE CHEST 1 VIEW COMPARISON:  August 04, 2020 FINDINGS: There is apparent scarring in the right perihilar region. There is suggestion of cavitation in this area in the right perihilar region. Slight scarring lateral right base. The lungs elsewhere are clear. Heart size and pulmonary  vascularity are normal. No appreciable adenopathy. No bone lesions. IMPRESSION: Scarring right mid lung. Suggestion of developing cavitation in this area which could be due to overlapping of areas of scarring and normal structures. Given concern for cavitation in this area, noncontrast chest CT advised to further evaluate. Slight scarring lateral right base. Lungs elsewhere are clear. Heart size normal. No adenopathy evident by radiography. Electronically Signed   By: Lowella Grip III M.D.   On: 08/22/2020 13:24   Portable chest 1 View  Result Date: 08/04/2020 CLINICAL DATA:  Pneumonia. EXAM: PORTABLE CHEST 1 VIEW COMPARISON:  08/03/2020 and 06/29/2020 FINDINGS: Chronic coarse lung markings. No new airspace disease. Heart size is stable. Trachea is midline. Old right rib fractures. Negative for a pneumothorax. IMPRESSION: Chronic lung changes without acute findings. Electronically Signed   By: Markus Daft M.D.   On: 08/04/2020 12:24   DG Chest Port 1 View  Result Date: 08/03/2020 CLINICAL DATA:  Shortness of breath and low-grade fever with possible sepsis. EXAM: PORTABLE CHEST 1 VIEW COMPARISON:  June 29, 2020 FINDINGS: The lungs are hyperinflated. Diffuse, chronic appearing increased interstitial lung markings are noted. There is no evidence of acute infiltrate, pleural effusion or pneumothorax. The heart size and mediastinal contours are within normal limits. Chronic sixth and seventh right rib fractures are seen. IMPRESSION: COPD without acute or active cardiopulmonary disease. Electronically Signed   By: Virgina Norfolk M.D.   On: 08/03/2020 19:24      ASSESSMENT/PLAN   This is an un kept male, poor nutrition here with abnormal chest ct: bilateral apical cavitary like findings which looks more pneumonia , early abcess or infected apical bullae. This does not look like cancer due to the rapid change from 1-2 months X-rays.hiv negative. Seem to be responding clinically Suggestions: -Would  cover anaerobes and MRSAsputum positivity vanco,  would still continue anaerobic coverage  -Pulmonary toilet -nutrition supplement -dvt prophylaxis -ID to see for any more advice -cxr in am -consider echo (doubt endocarditis, BC -ve,  but cannot rule out) -following   Copd -on appropriate coverage  Mild hyponatremia ( d/dx: nutritional, pulm dz/pneumonia)  -as per above         Thank you for allowing me to participate in the care of this patient.   Patient/Family are satisfied with care plan and all questions have been answered.  This document was prepared using Dragon voice recognition software and may include unintentional dictation errors.     Wallene Huh, M.D.  Division of [place]

## 2020-08-27 NOTE — Progress Notes (Signed)
PT Cancellation Note  Patient Details Name: Marcus [name] MRN: [id number] DOB: 06/20/1960   Cancelled Treatment:    Reason Eval/Treat Not Completed: Patient declined, no reason specified. Patient declined PT despite encouragement. He states he is going home tomorrow and feels fine. Of note, SNF was recommended by evaluating therapist. PT will continue with attempts as patient willing to participate.   Minna Merritts, PT, MPT  Percell Locus 08/27/2020, 3:39 PM

## 2020-08-27 NOTE — Plan of Care (Signed)
Still need to collect 2 more sputum samples - Patient aware but isn't having productive cough currently.  Will continue to assess.

## 2020-08-27 NOTE — TOC Progression Note (Signed)
Transition of Care [facility]) - Progression Note    Patient Details  Name: Marcus [name] MRN: [id number] Date of Birth: April 21, 1960  Transition of Care [facility]) CM/SW Contact  Beverly Sessions, RN Phone Number: 08/27/2020, 10:38 AM  Clinical Narrative:      No SNF bed offers.  Bed search extended  Expected Discharge Plan: [place] Barriers to Discharge: Continued Medical Work up  Expected Discharge Plan and Services Expected Discharge Plan: [place] arrangements for the past 2 months: Single Family Home                                       Social Determinants of Health (SDOH) Interventions    Readmission Risk Interventions Readmission Risk Prevention Plan 08/25/2020  Transportation Screening Complete  PCP or Specialist Appt within 3-5 Days Complete  HRI or [place] Complete  Social Work Consult for [place] Planning/Counseling Complete  Palliative Care Screening Complete  Medication Review [employer]) Complete  Some recent data might be hidden

## 2020-08-28 ENCOUNTER — Telehealth: Payer: Self-pay

## 2020-08-28 ENCOUNTER — Inpatient Hospital Stay (HOSPITAL_COMMUNITY)
Admit: 2020-08-28 | Discharge: 2020-08-28 | Disposition: A | Discharge status: Home / Self Care | Payer: Medicaid Other | Attending: Internal Medicine | Admitting: Internal Medicine

## 2020-08-28 ENCOUNTER — Inpatient Hospital Stay: Payer: Medicaid Other

## 2020-08-28 DIAGNOSIS — M0500 Felty's syndrome, unspecified site: Secondary | ICD-10-CM | POA: Diagnosis not present

## 2020-08-28 DIAGNOSIS — J9621 Acute and chronic respiratory failure with hypoxia: Secondary | ICD-10-CM | POA: Diagnosis not present

## 2020-08-28 DIAGNOSIS — M069 Rheumatoid arthritis, unspecified: Secondary | ICD-10-CM | POA: Diagnosis not present

## 2020-08-28 DIAGNOSIS — Z792 Long term (current) use of antibiotics: Secondary | ICD-10-CM

## 2020-08-28 DIAGNOSIS — D709 Neutropenia, unspecified: Secondary | ICD-10-CM

## 2020-08-28 DIAGNOSIS — J852 Abscess of lung without pneumonia: Secondary | ICD-10-CM | POA: Diagnosis not present

## 2020-08-28 DIAGNOSIS — J189 Pneumonia, unspecified organism: Secondary | ICD-10-CM | POA: Diagnosis not present

## 2020-08-28 DIAGNOSIS — B9562 Methicillin resistant Staphylococcus aureus infection as the cause of diseases classified elsewhere: Secondary | ICD-10-CM

## 2020-08-28 DIAGNOSIS — I38 Endocarditis, valve unspecified: Secondary | ICD-10-CM | POA: Diagnosis not present

## 2020-08-28 DIAGNOSIS — J449 Chronic obstructive pulmonary disease, unspecified: Secondary | ICD-10-CM | POA: Diagnosis not present

## 2020-08-28 LAB — ECHOCARDIOGRAM COMPLETE
AR max vel: 1.89 cm2
AV Area VTI: 2.04 cm2
AV Area mean vel: 2.1 cm2
AV Mean grad: 3 mmHg
AV Peak grad: 4.8 mmHg
Ao pk vel: 1.1 m/s
Area-P 1/2: 4.86 cm2
Height: 66 in
S' Lateral: 2.94 cm
Weight: 2137.58 oz

## 2020-08-28 LAB — BASIC METABOLIC PANEL
Anion gap: 7 (ref 5–15)
BUN: 16 mg/dL (ref 6–20)
CO2: 31 mmol/L (ref 22–32)
Calcium: 8 mg/dL — ABNORMAL LOW (ref 8.9–10.3)
Chloride: 101 mmol/L (ref 98–111)
Creatinine, Ser: 0.32 mg/dL — ABNORMAL LOW (ref 0.61–1.24)
GFR, Estimated: >60 mL/min (ref >60)
Glucose, Bld: 106 mg/dL — ABNORMAL HIGH (ref 70–99)
Potassium: 3.6 mmol/L (ref 3.5–5.1)
Sodium: 139 mmol/L (ref 135–145)

## 2020-08-28 LAB — CBC WITH DIFFERENTIAL/PLATELET
Abs Immature Granulocytes: 0.2 10*3/uL — ABNORMAL HIGH (ref 0.00–0.07)
Basophils Absolute: 0 10*3/uL (ref 0.0–0.1)
Basophils Relative: 1 %
Eosinophils Absolute: 0 10*3/uL (ref 0.0–0.5)
Eosinophils Relative: 1 %
HCT: 38.2 % — ABNORMAL LOW (ref 39.0–52.0)
Hemoglobin: 12.1 g/dL — ABNORMAL LOW (ref 13.0–17.0)
Immature Granulocytes: 12 %
Lymphocytes Relative: 31 %
Lymphs Abs: 0.5 10*3/uL — ABNORMAL LOW (ref 0.7–4.0)
MCH: 27.6 pg (ref 26.0–34.0)
MCHC: 31.7 g/dL (ref 30.0–36.0)
MCV: 87 fL (ref 80.0–100.0)
Monocytes Absolute: 0.5 10*3/uL (ref 0.1–1.0)
Monocytes Relative: 32 %
Neutro Abs: 0.4 10*3/uL — CL (ref 1.7–7.7)
Neutrophils Relative %: 23 %
Platelets: 142 10*3/uL — ABNORMAL LOW (ref 150–400)
RBC: 4.39 MIL/uL (ref 4.22–5.81)
RDW: 18.4 % — ABNORMAL HIGH (ref 11.5–15.5)
Smear Review: NORMAL
WBC: 1.7 10*3/uL — ABNORMAL LOW (ref 4.0–10.5)
nRBC: 1.8 % — ABNORMAL HIGH (ref 0.0–0.2)

## 2020-08-28 LAB — RESP PANEL BY RT-PCR (FLU A&B, COVID) ARPGX2
Influenza A by PCR: NEGATIVE
Influenza B by PCR: NEGATIVE
SARS Coronavirus 2 by RT PCR: NEGATIVE

## 2020-08-28 LAB — MAGNESIUM: Magnesium: 1.7 mg/dL (ref 1.7–2.4)

## 2020-08-28 IMAGING — DX DG CHEST 1V PORT
1 series · 1 of 1 positions shown · non-contrast
Comparison: CT [DATE].  Chest x-ray [DATE].

CLINICAL DATA: Hypoxia.

EXAM:
PORTABLE CHEST 1 VIEW

[chest ap]
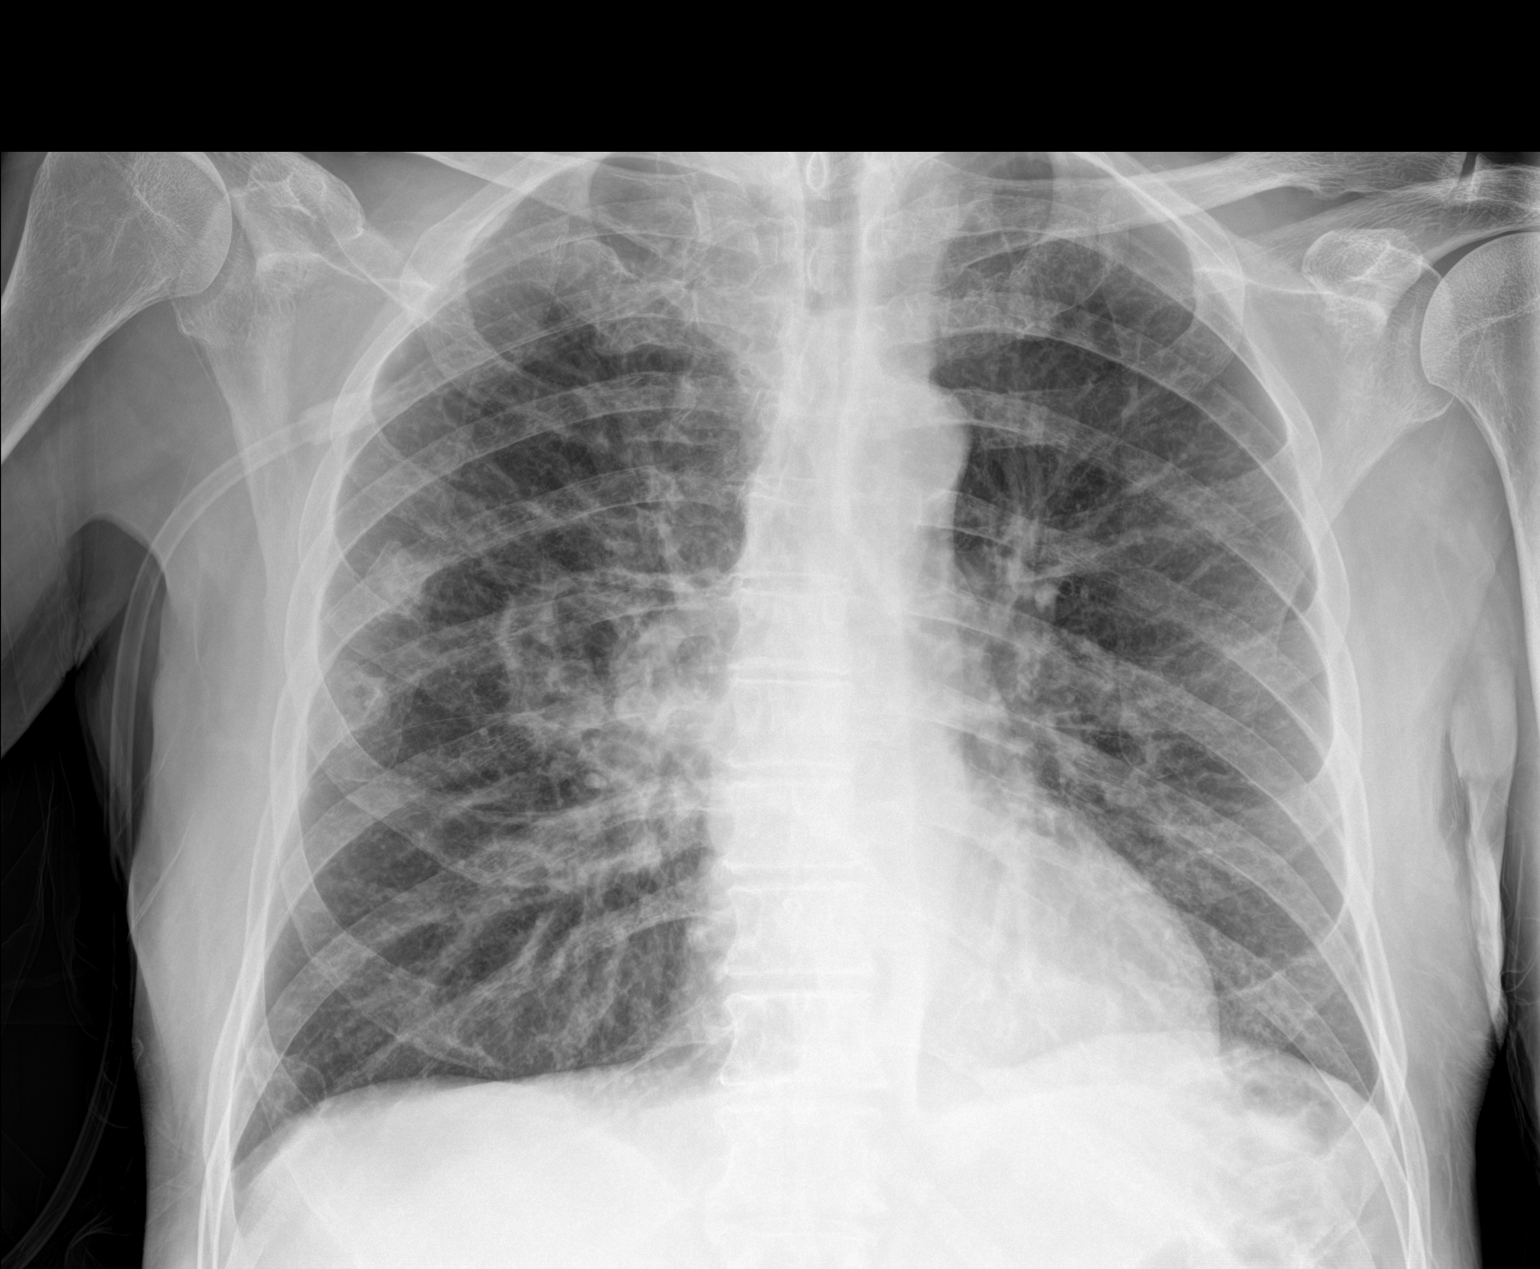

[1 of 1 positions shown; findings below may reference images not displayed]

FINDINGS: Mediastinum hilar structures normal. Heart size normal. Cavitary
process in the right mid lung and left upper lung best identified by
prior CT. No new findings are noted. No pleural effusion or
pneumothorax. Heart size stable. Degenerative change thoracic spine.
Old right rib fractures present.
IMPRESSION: Cavitary process in the right mid lung and left upper lung best
identified by prior CT. Chest is unchanged from prior exam.

## 2020-08-28 MED ORDER — TBO-FILGRASTIM 480 MCG/0.8ML ~~LOC~~ SOSY
480.0000 ug | PREFILLED_SYRINGE | Freq: Once | SUBCUTANEOUS | Status: AC
Start: 2020-08-28 — End: 2020-08-28
  Administered 2020-08-28: 480 ug via SUBCUTANEOUS
  Filled 2020-08-28: qty 0.8

## 2020-08-28 MED ORDER — TBO-FILGRASTIM 480 MCG/0.8ML ~~LOC~~ SOSY
480.0000 ug | PREFILLED_SYRINGE | Freq: Every day | SUBCUTANEOUS | Status: DC
  Administered 2020-08-29: 480 ug via SUBCUTANEOUS
  Filled 2020-08-28: qty 0.8

## 2020-08-28 NOTE — Consult Note (Signed)
[place] NOTE  Patient Care Team: [name], FNP as PCP - General (Family Medicine) Center, [facility]) Marlowe Sax, MD as Referring Physician (Internal Medicine)  CHIEF COMPLAINTS/PURPOSE OF CONSULTATION: Severe neutropenia/Felty syndrome/lung abscess  HISTORY OF PRESENTING ILLNESS: Patient is a poor historian. Marcus [name] 60 y.o.  male patient with history of COPD; chronic respiratory failure on 2 L of oxygen; rheumatoid arthritis/pancytopenia severe neutropenia secondary Felty syndrome admitted to the hospital worsening shortness of breath and cough.  Patient had fevers prior to admission.  Patient has lost significant weight and also generalized weakness.  CT scan in the emergency room showed right upper lobe infiltrate with cavitary lesion..  Patient started on Zosyn for lung abscess.  Of note sputum culture positive for MRSA.  Patient states oral vancomycin.  In the hospital patient is afebrile.  Patient is eager to go home.  Review of Systems  Constitutional: Positive for fever, malaise/fatigue and weight loss. Negative for chills and diaphoresis.  HENT: Negative for nosebleeds and sore throat.   Eyes: Negative for double vision.  Respiratory: Positive for cough and shortness of breath. Negative for hemoptysis, sputum production and wheezing.   Cardiovascular: Negative for chest pain, palpitations, orthopnea and leg swelling.  Gastrointestinal: Negative for abdominal pain, blood in stool, constipation, diarrhea, heartburn, melena, nausea and vomiting.  Genitourinary: Negative for dysuria, frequency and urgency.  Musculoskeletal: Positive for back pain, joint pain and neck pain.  Skin: Negative.  Negative for itching and rash.  Neurological: Negative for dizziness, tingling, focal weakness, weakness and headaches.  Endo/Heme/Allergies: Does not bruise/bleed easily.  Psychiatric/Behavioral: Negative for  depression. The patient is not nervous/anxious and does not have insomnia.      MEDICAL HISTORY:  Past Medical History:  Diagnosis Date  . COPD (chronic obstructive pulmonary disease) ([place])   . 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 Hernia, epigastric   . Pancytopenia ([place])   . Seropositive rheumatoid arthritis ([place])   . Tobacco use disorder     SURGICAL HISTORY: Past Surgical History:  Procedure Laterality Date  . INCISIONAL HERNIA REPAIR  01/17/2020   Procedure: HERNIA REPAIR INCISIONAL AND Silvestre Gunner;  Surgeon: Kinsinger, Arta Bruce, MD;  Location: [facility];  Service: General;;  . LAPAROTOMY N/A 01/17/2020   Procedure: EXPLORATORY LAPAROTOMY;  Surgeon: Kieth Brightly Arta Bruce, MD;  Location: [facility] ORS;  Service: General;  Laterality: N/A;    SOCIAL HISTORY: Social History   Socioeconomic History  . Marital status: Single    Spouse name: Not on file  . Number of children: Not on file  . Years of education: Not on file  . Highest education level: Not on file  Occupational History  . Not on file  Tobacco Use  . Smoking status: Current Every Day Smoker    Packs/day: 1.00    Types: Cigarettes  . Smokeless tobacco: Never Used  Substance and Sexual Activity  . Alcohol use: No    Alcohol/week: 0.0 standard drinks  . Drug use: Yes    Types: Marijuana  . Sexual activity: Not on file  Other Topics Concern  . Not on file  Social History Narrative   Lives at home with Mother. Ambulates independently.   Social Determinants of Health   Financial Resource Strain: Not on file  Food Insecurity: Not on file  Transportation Needs: Not on file  Physical Activity: Not on file  Stress: Not on file  Social Connections: Not on file  Intimate Partner Violence:  Not on file    FAMILY HISTORY: Family History  Problem Relation Age of Onset  . CAD Brother   . COPD Brother     ALLERGIES:  is allergic to levaquin [levofloxacin in d5w] and methotrexate.  MEDICATIONS:  Current  Facility-Administered Medications  Medication Dose Route Frequency Provider Last Rate Last Admin  . albuterol (PROVENTIL) (2.5 MG/3ML) 0.083% nebulizer solution 2.5 mg  2.5 mg Nebulization Q6H PRN Sharen Hones, MD   2.5 mg at 08/22/20 2102  . doxycycline (VIBRA-TABS) tablet 100 mg  100 mg Oral Q12H Thayer Headings, MD   100 mg at 08/28/20 2001  . feeding supplement (ENSURE ENLIVE / ENSURE PLUS) liquid 237 mL  237 mL Oral BID BM Sharen Hones, MD   237 mL at 08/28/20 1352  . mometasone-formoterol (DULERA) 100-5 MCG/ACT inhaler 2 puff  2 puff Inhalation BID Sharen Hones, MD   2 puff at 08/28/20 2001  . mupirocin ointment (BACTROBAN) 2 % 1 application  1 application Nasal BID Sharion Settler, NP   1 application at 03/47/42 2002  . nicotine (NICODERM CQ - dosed in mg/24 hours) patch 21 mg  21 mg Transdermal Daily Sharen Hones, MD   21 mg at 08/28/20 1046  . pantoprazole (PROTONIX) EC tablet 40 mg  40 mg Oral Daily Sharen Hones, MD   40 mg at 08/28/20 1045  . predniSONE (DELTASONE) tablet 10 mg  10 mg Oral BID WC Sharen Hones, MD   10 mg at 08/28/20 1547  . [START ON 08/29/2020] Tbo-Filgrastim (GRANIX) injection 480 mcg  480 mcg Subcutaneous Daily Charlaine Dalton R, MD          .  PHYSICAL EXAMINATION:  Vitals:   08/28/20 1906 08/28/20 2000  BP: (!) 143/76 (!) 143/86  Pulse: (!) 117 (!) 116  Resp: (!) 24 (!) 22  Temp: 98.8 F (37.1 C) 98.4 F (36.9 C)  SpO2: 94% 94%   Filed Weights   08/22/20 1220 08/24/20 0235  Weight: 125 lb 14.1 oz (57.1 kg) 133 lb 9.6 oz (60.6 kg)    Physical Exam HENT:     Head: Normocephalic and atraumatic.     Mouth/Throat:     Pharynx: No oropharyngeal exudate.  Eyes:     Pupils: Pupils are equal, round, and reactive to light.  Cardiovascular:     Rate and Rhythm: Normal rate and regular rhythm.  Pulmonary:     Effort: No respiratory distress.     Breath sounds: No wheezing.     Comments: Decreased air entry bilaterally no wheeze or  crackles. Abdominal:     General: Bowel sounds are normal. There is no distension.     Palpations: Abdomen is soft. There is no mass.     Tenderness: There is no abdominal tenderness. There is no guarding or rebound.  Musculoskeletal:        General: No tenderness. Normal range of motion.     Cervical back: Normal range of motion and neck supple.  Skin:    General: Skin is warm.  Neurological:     Mental Status: He is alert and oriented to person, place, and time.  Psychiatric:        Mood and Affect: Affect normal.      LABORATORY DATA:  I have reviewed the data as listed Lab Results  Component Value Date   WBC 1.7 (L) 08/28/2020   HGB 12.1 (L) 08/28/2020   HCT 38.2 (L) 08/28/2020   MCV 87.0 08/28/2020   PLT  142 (L) 08/28/2020   Recent Labs    11/30/19 1938 01/17/20 1454 06/10/20 2017 06/11/20 0457 08/03/20 1917 08/04/20 0525 08/22/20 1229 08/24/20 0801 08/26/20 0531 08/27/20 0409 08/28/20 0606  NA 137   < > 134*   < > 126* 133* 132*   < > 142 143 139  K 3.8   < > 3.7   < > 3.9 3.3* 4.1   < > 3.3* 3.7 3.6  CL 101   < > 99   < > 96* 105 98   < > 107 106 101  CO2 27   < > 27   < > 24 24 25    < > 29 31 31   GLUCOSE 85   < > 82   < > 105* 81 169*   < > 81 105* 106*  BUN 10   < > 14   < > 23* 17 27*   < > 12 13 16   CREATININE 0.59*   < > 0.63   < > 0.56* 0.30* 0.79   < > 0.39* 0.50* 0.32*  CALCIUM 8.6*   < > 8.3*   < > 7.7* 7.2* 8.0*   < > 8.0* 8.0* 8.0*  GFRNONAA >60   < > >60   < > >60 >60 >60   < > >60 >60 >60  GFRAA >60  --   --   --   --   --   --   --   --   --   --   PROT 7.8   < > 8.1   < > 6.3* 5.4* 6.6  --   --   --   --   ALBUMIN 3.3*   < > 2.8*   < > 2.6* 2.1* 2.0*  --   --   --   --   AST 25   < > 39   < > 27 21 37  --   --   --   --   ALT 13   < > 16   < > 17 15 18   --   --   --   --   ALKPHOS 113   < > 119   < > 144* 105 116  --   --   --   --   BILITOT 0.9   < > 0.7   < > 0.9 0.7 1.1  --   --   --   --   BILIDIR  --   --  0.2  --   --   --   --   --    --   --   --   IBILI  --   --  0.5  --   --   --   --   --   --   --   --    < > = values in this interval not displayed.    RADIOGRAPHIC STUDIES: I have personally reviewed the radiological images as listed and agreed with the findings in the report. CT Chest Wo Contrast  Result Date: 08/22/2020 CLINICAL DATA:  Abnormal x-ray. Interstitial infiltrates with possible right perihilar cavitation. Cough and shortness of breath. EXAM: CT CHEST WITHOUT CONTRAST TECHNIQUE: Multidetector CT imaging of the chest was performed following the standard protocol without IV contrast. COMPARISON:  Radiographs 08/22/2020 and 08/04/2020.  CT 06/10/2020. FINDINGS: Cardiovascular: Extensive three-vessel coronary artery atherosclerosis with lesser involvement of the aorta and great vessels. The heart size is  normal. There is no pericardial effusion. Mediastinum/Nodes: Multiple small mediastinal and hilar lymph nodes have enlarged compared with the chest CT of 2 months ago, likely reactive. No axillary lymphadenopathy. The thyroid gland, trachea and esophagus demonstrate no significant findings. Lungs/Pleura: There is no pleural effusion or pneumothorax. Moderate centrilobular and paraseptal emphysema. As seen on the earlier radiographs, there is a new cavitary mass inferomedially in the right upper lobe, measuring up to 7.8 x 5.5 cm on image 79/4. This has irregular thick walls and associated air-fluid levels. There was no abnormality in this area on the relatively recent prior chest CT, and this is most consistent with cavitating pneumonia. There is an additional cavitary lesion with an air-fluid level medially in the left upper lobe, measuring up to 2.4 cm on image 65/4. There is mild patchy airspace disease surrounding these cavitary infiltrates. Upper abdomen: Small calcified gallstones. No evidence of biliary dilatation or adrenal mass. Postsurgical changes in the anterior abdominal wall consistent with previous hernia  repair. Musculoskeletal/Chest wall: There is no chest wall mass or suspicious osseous finding. There are old fractures of the right 5th and 6th ribs posterolaterally which appear unchanged. IMPRESSION: 1. CT confirms the presence of a cavitary infiltrate in the right upper lobe and shows a smaller area of cavitation anteriorly in the left upper lobe. No abnormalities were present in these areas on CT performed 2 months ago to suggest cavitary neoplasm, and these findings are consistent with cavitating/necrotizing pneumonia. 2. Mildly enlarged mediastinal and hilar lymph nodes, likely reactive. 3. No associated pleural disease. 4. Cholelithiasis. 5. Underlying moderate Emphysema (ICD10-J43.9). 6. Coronary and Aortic Atherosclerosis (ICD10-I70.0). Electronically Signed   By: Richardean Sale M.D.   On: 08/22/2020 14:26   DG Chest Port 1 View  Result Date: 08/28/2020 CLINICAL DATA:  Hypoxia. EXAM: PORTABLE CHEST 1 VIEW COMPARISON:  CT 08/22/2020.  Chest x-ray 08/22/2020. FINDINGS: Mediastinum hilar structures normal. Heart size normal. Cavitary process in the right mid lung and left upper lung best identified by prior CT. No new findings are noted. No pleural effusion or pneumothorax. Heart size stable. Degenerative change thoracic spine. Old right rib fractures present. IMPRESSION: Cavitary process in the right mid lung and left upper lung best identified by prior CT. Chest is unchanged from prior exam. Electronically Signed   By: Marcello Moores  Register   On: 08/28/2020 08:25   DG Chest Portable 1 View  Result Date: 08/22/2020 CLINICAL DATA:  Shortness of breath EXAM: PORTABLE CHEST 1 VIEW COMPARISON:  August 04, 2020 FINDINGS: There is apparent scarring in the right perihilar region. There is suggestion of cavitation in this area in the right perihilar region. Slight scarring lateral right base. The lungs elsewhere are clear. Heart size and pulmonary vascularity are normal. No appreciable adenopathy. No bone lesions.  IMPRESSION: Scarring right mid lung. Suggestion of developing cavitation in this area which could be due to overlapping of areas of scarring and normal structures. Given concern for cavitation in this area, noncontrast chest CT advised to further evaluate. Slight scarring lateral right base. Lungs elsewhere are clear. Heart size normal. No adenopathy evident by radiography. Electronically Signed   By: Lowella Grip III M.D.   On: 08/22/2020 13:24   Portable chest 1 View  Result Date: 08/04/2020 CLINICAL DATA:  Pneumonia. EXAM: PORTABLE CHEST 1 VIEW COMPARISON:  08/03/2020 and 06/29/2020 FINDINGS: Chronic coarse lung markings. No new airspace disease. Heart size is stable. Trachea is midline. Old right rib fractures. Negative for a pneumothorax. IMPRESSION: Chronic lung  changes without acute findings. Electronically Signed   By: Markus Daft M.D.   On: 08/04/2020 12:24   DG Chest Port 1 View  Result Date: 08/03/2020 CLINICAL DATA:  Shortness of breath and low-grade fever with possible sepsis. EXAM: PORTABLE CHEST 1 VIEW COMPARISON:  June 29, 2020 FINDINGS: The lungs are hyperinflated. Diffuse, chronic appearing increased interstitial lung markings are noted. There is no evidence of acute infiltrate, pleural effusion or pneumothorax. The heart size and mediastinal contours are within normal limits. Chronic sixth and seventh right rib fractures are seen. IMPRESSION: COPD without acute or active cardiopulmonary disease. Electronically Signed   By: Virgina Norfolk M.D.   On: 08/03/2020 19:24   ECHOCARDIOGRAM COMPLETE  Result Date: 08/28/2020    ECHOCARDIOGRAM REPORT   Patient Name:   LINELL SHAWN [facility] Date of Exam: 08/28/2020 Medical Rec #:  [id number]    Height:       66.0 in Accession #:    [id number]   Weight:       133.6 lb Date of Birth:  08-14-1960     BSA:          1.685 m Patient Age:    59 years     BP:           120/74 mmHg Patient Gender: M            HR:           76 bpm. Exam Location:  [facility]  Procedure: 2D Echo, Cardiac Doppler and Color Doppler Indications:     Endocarditis I38  History:         Patient has prior history of Echocardiogram examinations, most                  recent 06/11/2020. COPD. Tobacco use disorder.  Sonographer:     Sherrie Sport RDCS (AE) Referring Phys:  [id number] Sharen Hones Diagnosing Phys: Nelva Bush MD  Sonographer Comments: Technically challenging study due to limited acoustic windows. Image acquisition challenging due to COPD. IMPRESSIONS  1. Left ventricular ejection fraction, by estimation, is 55 to 60%. The left ventricle has normal function. Left ventricular endocardial border not optimally defined to evaluate regional wall motion. Left ventricular diastolic parameters are consistent with Grade I diastolic dysfunction (impaired relaxation).  2. Right ventricular systolic function is normal. The right ventricular size is mildly enlarged. Mildly increased right ventricular wall thickness. There is normal pulmonary artery systolic pressure.  3. The mitral valve is degenerative. Trivial mitral valve regurgitation. No evidence of mitral stenosis.  4. The aortic valve was not well visualized. Aortic valve regurgitation is not visualized. No aortic stenosis is present.  5. Pulmonic valve regurgitation not well assessed.  6. The inferior vena cava is normal in size with greater than 50% respiratory variability, suggesting right atrial pressure of 3 mmHg. Conclusion(s)/Recommendation(s): Valves suboptimally imaged to exclude endocarditis. If clinical concern persists, transesophageal echocardiogram should be considered. FINDINGS  Left Ventricle: Left ventricular ejection fraction, by estimation, is 55 to 60%. The left ventricle has normal function. Left ventricular endocardial border not optimally defined to evaluate regional wall motion. The left ventricular internal cavity size was normal in size. There is no left ventricular hypertrophy. Left ventricular diastolic parameters  are consistent with Grade I diastolic dysfunction (impaired relaxation). Right Ventricle: The right ventricular size is mildly enlarged. Mildly increased right ventricular wall thickness. Right ventricular systolic function is normal. There is normal pulmonary artery systolic pressure. The tricuspid regurgitant velocity is 1.72 m/s,  and with an assumed right atrial pressure of 3 mmHg, the estimated right ventricular systolic pressure is 98.3 mmHg. Left Atrium: Left atrial size was normal in size. Right Atrium: Right atrial size was normal in size. Pericardium: There is no evidence of pericardial effusion. Mitral Valve: The mitral valve is degenerative in appearance. There is mild thickening of the mitral valve leaflet(s). There is mild calcification of the mitral valve leaflet(s). Mild mitral annular calcification. Trivial mitral valve regurgitation. No evidence of mitral valve stenosis. Tricuspid Valve: The tricuspid valve is grossly normal. Tricuspid valve regurgitation is trivial. Aortic Valve: The aortic valve was not well visualized. Aortic valve regurgitation is not visualized. No aortic stenosis is present. Aortic valve mean gradient measures 3.0 mmHg. Aortic valve peak gradient measures 4.8 mmHg. Aortic valve area, by VTI measures 2.04 cm. Pulmonic Valve: The pulmonic valve was not well visualized. Pulmonic valve regurgitation not well assessed. Aorta: The aortic root is normal in size and structure. Pulmonary Artery: The pulmonary artery is not well seen. Venous: The inferior vena cava is normal in size with greater than 50% respiratory variability, suggesting right atrial pressure of 3 mmHg. IAS/Shunts: The interatrial septum was not well visualized.  LEFT VENTRICLE PLAX 2D LVIDd:         5.38 cm  Diastology LVIDs:         2.94 cm  LV e' medial:    6.09 cm/s LV PW:         1.02 cm  LV E/e' medial:  10.3 LV IVS:        0.73 cm  LV e' lateral:   11.40 cm/s LVOT diam:     2.10 cm  LV E/e' lateral: 5.5 LV SV:          45 LV SV Index:   27 LVOT Area:     3.46 cm  RIGHT VENTRICLE RV Basal diam:  4.23 cm RV S prime:     9.46 cm/s TAPSE (M-mode): 2.0 cm LEFT ATRIUM             Index       RIGHT ATRIUM           Index LA diam:        3.50 cm 2.08 cm/m  RA Area:     16.50 cm LA Vol (A2C):   30.0 ml 17.81 ml/m RA Volume:   44.10 ml  26.18 ml/m LA Vol (A4C):   39.3 ml 23.33 ml/m LA Biplane Vol: 35.6 ml 21.13 ml/m  AORTIC VALVE                   PULMONIC VALVE AV Area (Vmax):    1.89 cm    PV Vmax:        0.88 m/s AV Area (Vmean):   2.10 cm    PV Peak grad:   3.1 mmHg AV Area (VTI):     2.04 cm    RVOT Peak grad: 3 mmHg AV Vmax:           110.00 cm/s AV Vmean:          74.000 cm/s AV VTI:            0.219 m AV Peak Grad:      4.8 mmHg AV Mean Grad:      3.0 mmHg LVOT Vmax:         60.10 cm/s LVOT Vmean:        44.800 cm/s LVOT VTI:  0.129 m LVOT/AV VTI ratio: 0.59  AORTA Ao Root diam: 3.60 cm MITRAL VALVE               TRICUSPID VALVE MV Area (PHT): 4.86 cm    TR Peak grad:   11.8 mmHg MV Decel Time: 156 msec    TR Vmax:        172.00 cm/s MV E velocity: 62.60 cm/s MV A velocity: 86.50 cm/s  SHUNTS MV E/A ratio:  0.72        Systemic VTI:  0.13 m                            Systemic Diam: 2.10 cm Nelva Bush MD Electronically signed by Nelva Bush MD Signature Date/Time: 08/28/2020/9:56:00 AM    Final     Acquired neutropenia [facility]) #60 year old male patient with history of rheumatoid arthritis/Felty syndrome; currently admitted for lung abscess/MRSA  #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  #Severe rheumatoid arthritis/hypersplenism-Felty syndrome  #MRSA-lung abscess s/p IV vancomycin; currently on p.o. doxycycline as per ID.  Recommendations:  #Recommend Granix daily to help improve neutrophil count.  Defer to ID-discussed recommendations for antibiotics.  Discussed with patient that we will plan outpatient Granix at least 3 times a week.   Thank  you Dr.Zhang for allowing me to participate in the care of your pleasant patient. Please do not hesitate to contact me with questions or concerns in the interim.  Discussed with Dr. Roosevelt Locks.   All questions were answered. The patient knows to call the clinic with any problems, questions or concerns.    Cammie Sickle, MD 08/28/2020 9:02 PM

## 2020-08-28 NOTE — Plan of Care (Signed)
  Problem: Activity: Goal: Ability to tolerate increased activity will improve Outcome: Progressing   Problem: Clinical Measurements: Goal: Ability to maintain a body temperature in the normal range will improve Outcome: Progressing   Problem: Respiratory: Goal: Ability to maintain adequate ventilation will improve Outcome: Progressing Goal: Ability to maintain a clear airway will improve Outcome: Progressing   

## 2020-08-28 NOTE — Progress Notes (Signed)
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 CT scan showed cavitary lesion in the right upper lobe.  Suspect lung abscess. Pulminology following. Of note: pt has been hypothermic this hospitalization, refusing warming, thought to be 2/2 relative adrenal insufficiency.    PT Comments    Pt was long sitting in bed upon arriving." I'm going to rehab today." Reached out to RN who was unaware of pt being [place]. Pt did agree to PT session and is cooperative throughout the remainder. Was on 3 L o2 [place] throughout. He becomes SOB with minimal activity. Ambulated with and without AD. Without use of AD, balance deficits are severe. Highly recommend DC to SNF to address deficits while maximizing independence with ADLs. Acute PT will conitnue to follow and progress as able per POC.     Follow Up Recommendations  SNF;Supervision for mobility/OOB     Equipment Recommendations  Other (comment) (defer to next level of care)       Precautions / Restrictions Precautions Precautions: Fall Restrictions Weight Bearing Restrictions: No    Mobility  Bed Mobility Overal bed mobility: Needs Assistance Bed Mobility: Supine to Sit   Sidelying to sit: Supervision Supine to sit: Supervision Sit to supine: Supervision   General bed mobility comments: Increased time to perform. Vcs for imporved sequencing and safety.    Transfers Overall transfer level: Needs assistance Equipment used: Rolling walker (2 wheeled);None Transfers: Sit to/from Stand Sit to Stand: Min assist;Min guard         General transfer comment: Min asisst to stand wihtout AD, CGA for STS to RW. pt does not use RW at home and will benefit form AD until balance/activity tolerance  improves  Ambulation/Gait Ambulation/Gait assistance: Min guard Gait Distance (Feet): 50 Feet Assistive device: Rolling walker (2 wheeled);None Gait Pattern/deviations: Step-through pattern Gait velocity: decreased   General Gait Details: Pt was able to ambulate 1 x 25 ft with RW and 1 x 25 ft without AD. pt has severe endurance defiucits with SOB noted. vitals were stable but needed prolonged rest to catch SOB.       Balance Overall balance assessment: Needs assistance Sitting-balance support: Bilateral upper extremity supported;Feet supported Sitting balance-Leahy Scale: Good Sitting balance - Comments: No LOB while scooting forward towards EOB   Standing balance support: No upper extremity supported;During functional activity Standing balance-Leahy Scale: Poor Standing balance comment: high fall risk without BUE support. Pt did not use RW or AD at baseline       Cognition Arousal/Alertness: Awake/alert Behavior During Therapy: WFL for tasks assessed/performed Overall Cognitive Status: Within Functional Limits for tasks assessed    General Comments: Pt is A&O x3, unable to state situation that brought him to the hospital. Able to follow 100% of simple 2 step commands.             Pertinent Vitals/Pain Pain Assessment: No/denies pain Pain Score: 0-No pain           PT Goals (current goals can now be found in the care plan section) Acute Rehab PT Goals Patient Stated Goal: get stronger and get back home Progress towards PT goals: Progressing toward goals    Frequency    Min 2X/week  PT Plan Current plan remains appropriate       AM-PAC PT "6 Clicks" Mobility   Outcome Measure  Help needed turning from your back to your side while in a flat bed without using bedrails?: None Help needed moving from lying on your back to sitting on the side of a flat bed without using bedrails?: A Little Help needed moving to and from a bed to a chair (including a  wheelchair)?: A Little Help needed standing up from a chair using your arms (e.g., wheelchair or bedside chair)?: A Little Help needed to walk in hospital room?: A Little Help needed climbing 3-5 steps with a railing? : A Lot 6 Click Score: 18    End of Session Equipment Utilized During Treatment: Gait belt Activity Tolerance: Patient limited by fatigue Patient left: in bed;with call bell/phone within reach;with bed alarm set Nurse Communication: Mobility status PT Visit Diagnosis: Unsteadiness on feet (R26.81);Muscle weakness (generalized) (M62.81);Difficulty in walking, not elsewhere classified (R26.2);Pain     Time: 1426-1440 PT Time Calculation (min) (ACUTE ONLY): 14 min  Charges:  $Gait Training: 8-22 mins                     Julaine Fusi PTA 08/28/20, 2:51 PM

## 2020-08-28 NOTE — Assessment & Plan Note (Addendum)
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  Given the rapidity and improvement of the ANC-I think is reasonable to hold further Granix.  We will plan outpatient Granix ~once a week/based upon blood counts.  #Severe rheumatoid arthritis/hypersplenism-Felty syndrome-stable  #MRSA-lung abscess s/p IV vancomycin; currently on p.o. doxycycline as per ID.  Stable

## 2020-08-28 NOTE — Progress Notes (Signed)
Pulmonary Medicine            HISTORY OF PRESENT ILLNESS   No new complaints , to rehab, ID to see in 3-4 weeks. No fever, worsening sob, hemoptysis, or chest pain. cxr from today reviewed. Answered his questions. Will see in clinic in 4 weeks   PAST MEDICAL HISTORY   Past Medical History:  Diagnosis Date  . COPD (chronic obstructive pulmonary disease) ([place])   . Felty syndrome ([place])   . Hernia, epigastric   . Pancytopenia ([place])   . Seropositive rheumatoid arthritis ([place])   . Tobacco use disorder      SURGICAL HISTORY   Past Surgical History:  Procedure Laterality Date  . INCISIONAL HERNIA REPAIR  01/17/2020   Procedure: HERNIA REPAIR INCISIONAL AND Silvestre Gunner;  Surgeon: Kinsinger, Arta Bruce, MD;  Location: [facility];  Service: General;;  . LAPAROTOMY N/A 01/17/2020   Procedure: EXPLORATORY LAPAROTOMY;  Surgeon: Kieth Brightly Arta Bruce, MD;  Location: [facility];  Service: General;  Laterality: N/A;     FAMILY HISTORY   Family History  Problem Relation Age of Onset  . CAD Brother   . COPD Brother      SOCIAL HISTORY   Social History   Tobacco Use  . Smoking status: Current Every Day Smoker    Packs/day: 1.00    Types: Cigarettes  . Smokeless tobacco: Never Used  Substance Use Topics  . Alcohol use: No    Alcohol/week: 0.0 standard drinks  . Drug use: Yes    Types: Marijuana     MEDICATIONS    Home Medication:    Current Medication:  Current Facility-Administered Medications:  .  albuterol (PROVENTIL) (2.5 MG/3ML) 0.083% nebulizer solution 2.5 mg, 2.5 mg, Nebulization, Q6H PRN, Sharen Hones, MD, 2.5 mg at 08/22/20 2102 .  doxycycline (VIBRA-TABS) tablet 100 mg, 100 mg, Oral, Q12H, [place]er, Okey Regal, MD, 100 mg at 08/28/20 1046 .  enoxaparin (LOVENOX) injection 40 mg, 40 mg, Subcutaneous, Q24H, Sharen Hones, MD, 40 mg at 08/27/20 2205 .  feeding supplement (ENSURE ENLIVE / ENSURE PLUS) liquid 237 mL, 237 mL, Oral, BID BM, Sharen Hones, MD, 237  mL at 08/28/20 1050 .  mometasone-formoterol (DULERA) 100-5 MCG/ACT inhaler 2 puff, 2 puff, Inhalation, BID, Sharen Hones, MD, 2 puff at 08/28/20 1149 .  mupirocin ointment (BACTROBAN) 2 % 1 application, 1 application, Nasal, BID, Sharion Settler, NP, 1 application at 40/98/11 1059 .  nicotine (NICODERM CQ - dosed in mg/24 hours) patch 21 mg, 21 mg, Transdermal, Daily, Sharen Hones, MD, 21 mg at 08/28/20 1046 .  pantoprazole (PROTONIX) EC tablet 40 mg, 40 mg, Oral, Daily, Sharen Hones, MD, 40 mg at 08/28/20 1045 .  predniSONE (DELTASONE) tablet 10 mg, 10 mg, Oral, BID WC, Sharen Hones, MD, 10 mg at 08/28/20 1045    ALLERGIES   Levaquin [levofloxacin in d5w] and Methotrexate     REVIEW OF SYSTEMS    Review of Systems:  Gen:  Denies  fever, sweats, chills weigh loss  HEENT: Denies blurred vision, double vision, ear pain, eye pain, hearing loss, nose bleeds, sore throat Cardiac:  No dizziness, chest pain or heaviness, chest tightness,edema Resp:   Denies cough or sputum porduction, shortness of breath,wheezing, hemoptysis,  Gi: Denies swallowing difficulty, stomach pain, nausea or vomiting, diarrhea, constipation, bowel incontinence Gu:  Denies bladder incontinence, burning urine Ext:   Denies Joint pain, stiffness or swelling Skin: Denies  skin rash, easy bruising or bleeding or hives Endoc:  Denies polyuria, polydipsia , polyphagia or weight change Psych:   Denies depression, insomnia or hallucinations   Other:  All other systems negative   VS: BP 116/70 (BP Location: Right Arm)   Pulse 96   Temp 97.7 F (36.5 C) (Oral)   Resp (!) 24   Ht 5\' 6"  (1.676 m)   Wt 60.6 kg   SpO2 94%   BMI 21.56 kg/m      PHYSICAL EXAM    GENERAL:NAD, eating, no new complaints. , [place] 02 mommn at 5 liters.  HEAD: Normocephalic, atraumatic.  EYES: Pupils equal, round, reactive to light. Extraocular muscles intact. No scleral icterus.  MOUTH: Moist mucosal membrane. Dentition intact. No  abscess noted.  EAR, NOSE, THROAT: Clear without exudates. No external lesions.  NECK: Supple. No thyromegaly. No nodules. No JVD.  PULMONARY: - wheezes CARDIOVASCULAR: S1 and S2. Regular rate and rhythm. No murmurs, rubs, or gallops. No edema. Pedal pulses 2+ bilaterally.  GASTROINTESTINAL: Soft, nontender, nondistended. No masses. Positive bowel sounds. No hepatosplenomegaly.  MUSCULOSKELETAL: No swelling, clubbing, or edema. Range of motion full in all extremities.  NEUROLOGIC: Cranial nerves II through XII are intact. No gross focal neurological deficits. Sensation intact. Reflexes intact.  SKIN: No ulceration, lesions, rashes, or cyanosis. Skin warm and dry. Turgor intact.  PSYCHIATRIC: Mood, affect within normal limits. The patient is awake, alert and oriented x 3. Insight, judgment intact.       IMAGING    CT Chest Wo Contrast  Result Date: 08/22/2020 CLINICAL DATA:  Abnormal x-ray. Interstitial infiltrates with possible right perihilar cavitation. Cough and shortness of breath. EXAM: CT CHEST WITHOUT CONTRAST TECHNIQUE: Multidetector CT imaging of the chest was performed following the standard protocol without IV contrast. COMPARISON:  Radiographs 08/22/2020 and 08/04/2020.  CT 06/10/2020. FINDINGS: Cardiovascular: Extensive three-vessel coronary artery atherosclerosis with lesser involvement of the aorta and great vessels. The heart size is normal. There is no pericardial effusion. Mediastinum/Nodes: Multiple small mediastinal and hilar lymph nodes have enlarged compared with the chest CT of 2 months ago, likely reactive. No axillary lymphadenopathy. The thyroid gland, trachea and esophagus demonstrate no significant findings. Lungs/Pleura: There is no pleural effusion or pneumothorax. Moderate centrilobular and paraseptal emphysema. As seen on the earlier radiographs, there is a new cavitary mass inferomedially in the right upper lobe, measuring up to 7.8 x 5.5 cm on image 79/4. This has  irregular thick walls and associated air-fluid levels. There was no abnormality in this area on the relatively recent prior chest CT, and this is most consistent with cavitating pneumonia. There is an additional cavitary lesion with an air-fluid level medially in the left upper lobe, measuring up to 2.4 cm on image 65/4. There is mild patchy airspace disease surrounding these cavitary infiltrates. Upper abdomen: Small calcified gallstones. No evidence of biliary dilatation or adrenal mass. Postsurgical changes in the anterior abdominal wall consistent with previous hernia repair. Musculoskeletal/Chest wall: There is no chest wall mass or suspicious osseous finding. There are old fractures of the right 5th and 6th ribs posterolaterally which appear unchanged. IMPRESSION: 1. CT confirms the presence of a cavitary infiltrate in the right upper lobe and shows a smaller area of cavitation anteriorly in the left upper lobe. No abnormalities were present in these areas on CT performed 2 months ago to suggest cavitary neoplasm, and these findings are consistent with cavitating/necrotizing pneumonia. 2. Mildly enlarged mediastinal and hilar lymph nodes, likely reactive. 3. No associated pleural disease. 4. Cholelithiasis. 5. Underlying moderate Emphysema (ICD10-J43.9).  6. Coronary and Aortic Atherosclerosis (ICD10-I70.0). Electronically Signed   By: Richardean Sale M.D.   On: 08/22/2020 14:26   DG Chest Port 1 View  Result Date: 08/28/2020 CLINICAL DATA:  Hypoxia. EXAM: PORTABLE CHEST 1 VIEW COMPARISON:  CT 08/22/2020.  Chest x-ray 08/22/2020. FINDINGS: Mediastinum hilar structures normal. Heart size normal. Cavitary process in the right mid lung and left upper lung best identified by prior CT. No new findings are noted. No pleural effusion or pneumothorax. Heart size stable. Degenerative change thoracic spine. Old right rib fractures present. IMPRESSION: Cavitary process in the right mid lung and left upper lung best  identified by prior CT. Chest is unchanged from prior exam. Electronically Signed   By: Marcello Moores  Register   On: 08/28/2020 08:25   DG Chest Portable 1 View  Result Date: 08/22/2020 CLINICAL DATA:  Shortness of breath EXAM: PORTABLE CHEST 1 VIEW COMPARISON:  August 04, 2020 FINDINGS: There is apparent scarring in the right perihilar region. There is suggestion of cavitation in this area in the right perihilar region. Slight scarring lateral right base. The lungs elsewhere are clear. Heart size and pulmonary vascularity are normal. No appreciable adenopathy. No bone lesions. IMPRESSION: Scarring right mid lung. Suggestion of developing cavitation in this area which could be due to overlapping of areas of scarring and normal structures. Given concern for cavitation in this area, noncontrast chest CT advised to further evaluate. Slight scarring lateral right base. Lungs elsewhere are clear. Heart size normal. No adenopathy evident by radiography. Electronically Signed   By: Lowella Grip III M.D.   On: 08/22/2020 13:24   Portable chest 1 View  Result Date: 08/04/2020 CLINICAL DATA:  Pneumonia. EXAM: PORTABLE CHEST 1 VIEW COMPARISON:  08/03/2020 and 06/29/2020 FINDINGS: Chronic coarse lung markings. No new airspace disease. Heart size is stable. Trachea is midline. Old right rib fractures. Negative for a pneumothorax. IMPRESSION: Chronic lung changes without acute findings. Electronically Signed   By: Markus Daft M.D.   On: 08/04/2020 12:24   DG Chest Port 1 View  Result Date: 08/03/2020 CLINICAL DATA:  Shortness of breath and low-grade fever with possible sepsis. EXAM: PORTABLE CHEST 1 VIEW COMPARISON:  June 29, 2020 FINDINGS: The lungs are hyperinflated. Diffuse, chronic appearing increased interstitial lung markings are noted. There is no evidence of acute infiltrate, pleural effusion or pneumothorax. The heart size and mediastinal contours are within normal limits. Chronic sixth and seventh right rib  fractures are seen. IMPRESSION: COPD without acute or active cardiopulmonary disease. Electronically Signed   By: Virgina Norfolk M.D.   On: 08/03/2020 19:24   ECHOCARDIOGRAM COMPLETE  Result Date: 08/28/2020    ECHOCARDIOGRAM REPORT   Patient Name:   Marcus [name] [facility] Date of Exam: 08/28/2020 Medical Rec #:  [id number]    Height:       66.0 in Accession #:    [id number]   Weight:       133.6 lb Date of Birth:  [birth year]     BSA:          1.685 m Patient Age:    60 years     BP:           120/74 mmHg Patient Gender: M            HR:           76 bpm. Exam Location:  [facility] Procedure: 2D Echo, Cardiac Doppler and Color Doppler Indications:     Endocarditis I38  History:  Patient has prior history of Echocardiogram examinations, most                  recent 06/11/2020. COPD. Tobacco use disorder.  Sonographer:     Sherrie Sport RDCS (AE) Referring Phys:  [id number] Sharen Hones Diagnosing Phys: Nelva Bush MD  Sonographer Comments: Technically challenging study due to limited acoustic windows. Image acquisition challenging due to COPD. IMPRESSIONS  1. Left ventricular ejection fraction, by estimation, is 55 to 60%. The left ventricle has normal function. Left ventricular endocardial border not optimally defined to evaluate regional wall motion. Left ventricular diastolic parameters are consistent with Grade I diastolic dysfunction (impaired relaxation).  2. Right ventricular systolic function is normal. The right ventricular size is mildly enlarged. Mildly increased right ventricular wall thickness. There is normal pulmonary artery systolic pressure.  3. The mitral valve is degenerative. Trivial mitral valve regurgitation. No evidence of mitral stenosis.  4. The aortic valve was not well visualized. Aortic valve regurgitation is not visualized. No aortic stenosis is present.  5. Pulmonic valve regurgitation not well assessed.  6. The inferior vena cava is normal in size with greater than 50% respiratory variability,  suggesting right atrial pressure of 3 mmHg. Conclusion(s)/Recommendation(s): Valves suboptimally imaged to exclude endocarditis. If clinical concern persists, transesophageal echocardiogram should be considered. FINDINGS  Left Ventricle: Left ventricular ejection fraction, by estimation, is 55 to 60%. The left ventricle has normal function. Left ventricular endocardial border not optimally defined to evaluate regional wall motion. The left ventricular internal cavity size was normal in size. There is no left ventricular hypertrophy. Left ventricular diastolic parameters are consistent with Grade I diastolic dysfunction (impaired relaxation). Right Ventricle: The right ventricular size is mildly enlarged. Mildly increased right ventricular wall thickness. Right ventricular systolic function is normal. There is normal pulmonary artery systolic pressure. The tricuspid regurgitant velocity is 1.72 m/s, and with an assumed right atrial pressure of 3 mmHg, the estimated right ventricular systolic pressure is 52.8 mmHg. Left Atrium: Left atrial size was normal in size. Right Atrium: Right atrial size was normal in size. Pericardium: There is no evidence of pericardial effusion. Mitral Valve: The mitral valve is degenerative in appearance. There is mild thickening of the mitral valve leaflet(s). There is mild calcification of the mitral valve leaflet(s). Mild mitral annular calcification. Trivial mitral valve regurgitation. No evidence of mitral valve stenosis. Tricuspid Valve: The tricuspid valve is grossly normal. Tricuspid valve regurgitation is trivial. Aortic Valve: The aortic valve was not well visualized. Aortic valve regurgitation is not visualized. No aortic stenosis is present. Aortic valve mean gradient measures 3.0 mmHg. Aortic valve peak gradient measures 4.8 mmHg. Aortic valve area, by VTI measures 2.04 cm. Pulmonic Valve: The pulmonic valve was not well visualized. Pulmonic valve regurgitation not well  assessed. Aorta: The aortic root is normal in size and structure. Pulmonary Artery: The pulmonary artery is not well seen. Venous: The inferior vena cava is normal in size with greater than 50% respiratory variability, suggesting right atrial pressure of 3 mmHg. IAS/Shunts: The interatrial septum was not well visualized.  LEFT VENTRICLE PLAX 2D LVIDd:         5.38 cm  Diastology LVIDs:         2.94 cm  LV e' medial:    6.09 cm/s LV PW:         1.02 cm  LV E/e' medial:  10.3 LV IVS:        0.73 cm  LV e' lateral:  11.40 cm/s LVOT diam:     2.10 cm  LV E/e' lateral: 5.5 LV SV:         45 LV SV Index:   27 LVOT Area:     3.46 cm  RIGHT VENTRICLE RV Basal diam:  4.23 cm RV S prime:     9.46 cm/s TAPSE (M-mode): 2.0 cm LEFT ATRIUM             Index       RIGHT ATRIUM           Index LA diam:        3.50 cm 2.08 cm/m  RA Area:     16.50 cm LA Vol (A2C):   30.0 ml 17.81 ml/m RA Volume:   44.10 ml  26.18 ml/m LA Vol (A4C):   39.3 ml 23.33 ml/m LA Biplane Vol: 35.6 ml 21.13 ml/m  AORTIC VALVE                   PULMONIC VALVE AV Area (Vmax):    1.89 cm    PV Vmax:        0.88 m/s AV Area (Vmean):   2.10 cm    PV Peak grad:   3.1 mmHg AV Area (VTI):     2.04 cm    RVOT Peak grad: 3 mmHg AV Vmax:           110.00 cm/s AV Vmean:          74.000 cm/s AV VTI:            0.219 m AV Peak Grad:      4.8 mmHg AV Mean Grad:      3.0 mmHg LVOT Vmax:         60.10 cm/s LVOT Vmean:        44.800 cm/s LVOT VTI:          0.129 m LVOT/AV VTI ratio: 0.59  AORTA Ao Root diam: 3.60 cm MITRAL VALVE               TRICUSPID VALVE MV Area (PHT): 4.86 cm    TR Peak grad:   11.8 mmHg MV Decel Time: 156 msec    TR Vmax:        172.00 cm/s MV E velocity: 62.60 cm/s MV A velocity: 86.50 cm/s  SHUNTS MV E/A ratio:  0.72        Systemic VTI:  0.13 m                            Systemic Diam: 2.10 cm Nelva Bush MD Electronically signed by Nelva Bush MD Signature Date/Time: 08/28/2020/9:56:00 AM    Final       ASSESSMENT/PLAN  This  is an un kept male, poor nutrition here with abnormal chest ct: bilateral apical cavitary like findings which looks more pneumonia , early abcess or infected apical bullae.hiv negative. Echo did not visualisedl valves well. clinicalluy doubt endocarditis. Seem to be responding clinically. Stable to be d/c to rehab. Appreciate ID input Suggestions: -Pulmonary toilet -nutrition supplement -dvt prophylaxis -doxycycline 100 mg q 12 hrs x 4 weeks per ID -pulmonary clinic in 4 weeks  Copd -d/c on present reegimen     Thank you for allowing me to participate in the care of this patient.   Patient/Family are satisfied with care plan and all questions have been answered.  This document was prepared using Set designer  software and may include unintentional dictation errors.     Wallene Huh, M.D.  Division of [place]

## 2020-08-28 NOTE — Progress Notes (Signed)
*  PRELIMINARY RESULTS* Echocardiogram 2D Echocardiogram has been performed.  Marcus [name] 08/28/2020, 8:36 AM

## 2020-08-28 NOTE — Progress Notes (Signed)
PROGRESS NOTE    Marcus [name]  YIR:[id number] DOB: 1960-09-22 DOA: 08/22/2020 PCP: [name], FNP   Chief complaint for shortness of breath. Brief Narrative:  Marcus [name] is a 60-year-male with history of COPD, chronic hypoxemic respiratoryfailure on 2 L oxygen, rheumatoid arthritis, pancytopenia, Felty syndrome who came to the hospital with complaints of short of breath and a cough for at least a month. Upon arriving the emergency room, his CT scan showed cavitary lesion in the right upper lobe. Suspect lung abscess, he is seen by pulmonology, placed on Zosyn, also added vancomycin. 5/23.  Sputum culture came back with MRSA, continue Vanco, discontinue Zosyn.  Obtain echocardiogram.  Blood culture negative. ID recommended 6 weeks of doxycycline orally. Patient is a pending for nursing home placement.  Assessment & Plan:   Active Problems:   Moderate protein-calorie malnutrition (HCC)   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HCC)   Chronic obstructive pulmonary disease (HCC)   Hypokalemia   Chronic diastolic CHF (congestive heart failure) (HCC)   Hyponatremia   Cavitary pneumonia   Acute on chronic respiratory failure with hypoxia ([place])   Hypothermia  #1.  Left upper lobe MRSA pneumonia with abscess. Patient blood culture was negative, sputum culture positive for MRSA.  I will continue vancomycin until discharge.  We will continue 6 weeks of doxycycline per recommendation by ID. Echocardiogram showed ejection fraction 55 to 60%, no gross valvular abnormality. Acid-fast smear was negative, but so far has not completed 3 series, may not be relevant as patient has confirmed MRSA sepsis.  2. Rheumatoid arthritis. Felty syndrome. Pancytopenia. Worsening neutropenia. Hypothermia. Relative adrenal insufficiency. Patient initially had a hypothermia, was caused by relative adrenal insufficiency.  Prednisone dose was doubled, after that condition has resolved. Patient also developed a worsening  neutropenia, he has a chronic neutropenia.  He is seen by oncology, thought to be secondary to infection. Granix is given, he will follow-up with hematology as outpatient for intermittent injection.  #3.  Failure to thrive. Likely dementia. Patient does not seem to have any mental illness.  However, patient has not been able to care for himself.  He could not perform any ADL, he could not even clean his bottom.  His mother has been taking care of him at home for many years, she is approaching 59 years of age. Patient could not function at home, therefore, he is going to nursing home for rehab.  #4.  Acute on chronic hypoxemic respiratory failure. COPD. No COPD exacerbation, oxygenation gradually improving.  5.  Hypokalemia. Improved.   DVT prophylaxis: Lovenox Code Status: Full Family Communication:  Disposition Plan:  .   Status is: Inpatient  Remains inpatient appropriate because:Unsafe d/c plan   Dispo: The patient is from: Home              Anticipated d/c is to: SNF              Patient currently is medically stable to d/c.   Difficult to place patient No        I/O last 3 completed shifts: In: -  Out: 2000 [Urine:2000] No intake/output data recorded.     Consultants:   Pulmonology, ID  Procedures: None  Antimicrobials:  Vancomycin  Subjective: Patient is still on 3 L oxygen, short of breath with exertion. Cough, with white mucus. No fever or chills. No dysuria hematuria. No abdominal pain or nausea vomiting.  Objective: Vitals:   08/27/20 1940 08/27/20 2100 08/28/20 0858 08/28/20 1209  BP: 138/83 120/74 [name])  142/87 116/70  Pulse: 91 76 81 96  Resp: (!) 24 (!) 24 15 (!) 24  Temp: (!) 97.5 F (36.4 C) 97.6 F (36.4 C) 97.7 F (36.5 C) 97.7 F (36.5 C)  TempSrc:  Oral Oral Oral  SpO2: 96% 96% 98% 94%  Weight:      Height:       No intake or output data in the 24 hours ending 08/28/20 1342 Filed Weights   08/22/20 1220 08/24/20 0235   Weight: 57.1 kg 60.6 kg    Examination:  General exam: Appears calm and comfortable  Respiratory system: Clear to auscultation. Respiratory effort normal. Cardiovascular system: S1 & S2 heard, RRR. No JVD, murmurs, rubs, gallops or clicks. No pedal edema. Gastrointestinal system: Abdomen is nondistended, soft and nontender. No organomegaly or masses felt. Normal bowel sounds heard. Central nervous system: Alert and oriented x3. No focal neurological deficits. Extremities: Symmetric 5 x 5 power. Skin: No rashes, lesions or ulcers Psychiatry:  Mood & affect appropriate.     Data Reviewed: I have personally reviewed following labs and imaging studies  CBC: Recent Labs  Lab 08/25/20 0527 08/25/20 0628 08/26/20 0531 08/27/20 0409 08/28/20 0606  WBC 1.1* 1.1* 1.7* 2.0* 1.7*  NEUTROABS 0.2* 0.2* 0.5* 0.7* 0.4*  HGB 12.1* 12.2* 12.4* 13.2 12.1*  HCT 38.0* 38.0* 39.2 41.9 38.2*  MCV 85.4 85.0 86.2 85.5 87.0  PLT 134* 136* 130* 145* 222*   Basic Metabolic Panel: Recent Labs  Lab 08/22/20 1229 08/24/20 0801 08/25/20 0527 08/26/20 0531 08/27/20 0409 08/28/20 0606  NA 132*  --  142 142 143 139  K 4.1  --  3.3* 3.3* 3.7 3.6  CL 98  --  109 107 106 101  CO2 25  --  28 29 31 31   GLUCOSE 169*  --  145* 81 105* 106*  BUN 27*  --  13 12 13 16   CREATININE 0.79 0.51* 0.45* 0.39* 0.50* 0.32*  CALCIUM 8.0*  --  7.8* 8.0* 8.0* 8.0*  MG  --   --  2.0 1.8 1.7 1.7   GFR: Estimated Creatinine Clearance: 85.2 mL/min (A) (by C-G formula based on SCr of 0.32 mg/dL (L)). Liver Function Tests: Recent Labs  Lab 08/22/20 1229  AST 37  ALT 18  ALKPHOS 116  BILITOT 1.1  PROT 6.6  ALBUMIN 2.0*   No results for input(s): LIPASE, AMYLASE in the last 168 hours. No results for input(s): AMMONIA in the last 168 hours. Coagulation Profile: No results for input(s): INR, PROTIME in the last 168 hours. Cardiac Enzymes: No results for input(s): CKTOTAL, CKMB, CKMBINDEX, TROPONINI in the last  168 hours. BNP (last 3 results) No results for input(s): PROBNP in the last 8760 hours. HbA1C: No results for input(s): HGBA1C in the last 72 hours. CBG: No results for input(s): GLUCAP in the last 168 hours. Lipid Profile: No results for input(s): CHOL, HDL, LDLCALC, TRIG, CHOLHDL, LDLDIRECT in the last 72 hours. Thyroid Function Tests: No results for input(s): TSH, T4TOTAL, FREET4, T3FREE, THYROIDAB in the last 72 hours. Anemia Panel: No results for input(s): VITAMINB12, FOLATE, FERRITIN, TIBC, IRON, RETICCTPCT in the last 72 hours. Sepsis Labs: No results for input(s): PROCALCITON, LATICACIDVEN in the last 168 hours.  Recent Results (from the past 240 hour(s))  Resp Panel by RT-PCR (Flu A&B, Covid) Nasopharyngeal Swab     Status: None   Collection Time: 08/22/20 12:45 PM   Specimen: Nasopharyngeal Swab; Nasopharyngeal(NP) swabs in vial transport medium  Result Value Ref  Range Status   SARS Coronavirus 2 by RT PCR NEGATIVE NEGATIVE Final    Comment: (NOTE) SARS-CoV-2 target nucleic acids are NOT DETECTED.  The SARS-CoV-2 RNA is generally detectable in upper respiratory specimens during the acute phase of infection. The lowest concentration of SARS-CoV-2 viral copies this assay can detect is 138 copies/mL. A negative result does not preclude SARS-Cov-2 infection and should not be used as the sole basis for treatment or other patient management decisions. A negative result may occur with  improper specimen collection/handling, submission of specimen other than nasopharyngeal swab, presence of viral mutation(s) within the areas targeted by this assay, and inadequate number of viral copies(<138 copies/mL). A negative result must be combined with clinical observations, patient history, and epidemiological information. The expected result is Negative.  Fact Sheet for Patients:  EntrepreneurPulse.com.au  Fact Sheet for Healthcare Providers:   IncredibleEmployment.be  This test is no t yet approved or cleared by the [place] FDA and  has been authorized for detection and/or diagnosis of SARS-CoV-2 by FDA under an Emergency Use Authorization (EUA). This EUA will remain  in effect (meaning this test can be used) for the duration of the COVID-19 declaration under Section 564(b)(1) of the Act, 21 U.S.C.section 360bbb-3(b)(1), unless the authorization is terminated  or revoked sooner.       Influenza A by PCR NEGATIVE NEGATIVE Final   Influenza B by PCR NEGATIVE NEGATIVE Final    Comment: (NOTE) The Xpert Xpress SARS-CoV-2/FLU/RSV plus assay is intended as an aid in the diagnosis of influenza from Nasopharyngeal swab specimens and should not be used as a sole basis for treatment. Nasal washings and aspirates are unacceptable for Xpert Xpress SARS-CoV-2/FLU/RSV testing.  Fact Sheet for Patients: EntrepreneurPulse.com.au  Fact Sheet for Healthcare Providers: IncredibleEmployment.be  This test is not yet approved or cleared by the [place] FDA and has been authorized for detection and/or diagnosis of SARS-CoV-2 by FDA under an Emergency Use Authorization (EUA). This EUA will remain in effect (meaning this test can be used) for the duration of the COVID-19 declaration under Section 564(b)(1) of the Act, 21 U.S.C. section 360bbb-3(b)(1), unless the authorization is terminated or revoked.  Performed at [facility], [address] [place], [place] [address]   Blood culture (routine x 2)     Status: None   Collection Time: 08/22/20  3:18 PM   Specimen: BLOOD  Result Value Ref Range Status   Specimen Description BLOOD RIGHT ANTECUBITAL  Final   Special Requests   Final    BOTTLES DRAWN AEROBIC AND ANAEROBIC Blood Culture adequate volume   Culture   Final    NO GROWTH 5 DAYS Performed at [facility], [address] [place], [place]  [address]    Report Status 08/27/2020 FINAL  Final  Blood culture (routine x 2)     Status: None   Collection Time: 08/22/20  3:18 PM   Specimen: BLOOD  Result Value Ref Range Status   Specimen Description BLOOD LEFT ANTECUBITAL  Final   Special Requests   Final    BOTTLES DRAWN AEROBIC AND ANAEROBIC Blood Culture adequate volume   Culture   Final    NO GROWTH 5 DAYS Performed at [facility], [address], [place], [place] [address]    Report Status 08/27/2020 FINAL  Final  Expectorated Sputum Assessment w Gram Stain, Rflx to Resp Cult     Status: None   Collection Time: 08/23/20  4:15 AM   Specimen: Expectorated  Sputum  Result Value Ref Range Status   Specimen Description EXPECTORATED SPUTUM  Final   Special Requests NONE  Final   Sputum evaluation   Final    THIS SPECIMEN IS ACCEPTABLE FOR SPUTUM CULTURE Performed at [facility], [address], [place], [place] [address]    Report Status 08/24/2020 FINAL  Final  Culture, Respiratory w Gram Stain     Status: None   Collection Time: 08/23/20  4:15 AM  Result Value Ref Range Status   Specimen Description   Final    EXPECTORATED SPUTUM Performed at [facility], [address] [place], [place] [address]    Special Requests   Final    NONE Reflexed from [id number] Performed at [facility], [address] [place], [place] [address]    Gram Stain   Final    MODERATE WBC PRESENT,BOTH PMN AND MONONUCLEAR MODERATE GRAM POSITIVE COCCI IN PAIRS IN CLUSTERS Performed at [facility], [place] [address] [place], [place] [address]    Culture   Final    MODERATE METHICILLIN RESISTANT STAPHYLOCOCCUS AUREUS   Report Status 08/26/2020 FINAL  Final   Organism ID, Bacteria METHICILLIN RESISTANT STAPHYLOCOCCUS AUREUS  Final      Susceptibility   Methicillin resistant staphylococcus aureus - MIC*    CIPROFLOXACIN <=0.5 SENSITIVE Sensitive     ERYTHROMYCIN >=8 RESISTANT Resistant     GENTAMICIN  <=0.5 SENSITIVE Sensitive     OXACILLIN >=4 RESISTANT Resistant     TETRACYCLINE <=1 SENSITIVE Sensitive     VANCOMYCIN <=0.5 SENSITIVE Sensitive     TRIMETH/SULFA <=10 SENSITIVE Sensitive     CLINDAMYCIN <=0.25 SENSITIVE Sensitive     RIFAMPIN <=0.5 SENSITIVE Sensitive     Inducible Clindamycin NEGATIVE Sensitive     * MODERATE METHICILLIN RESISTANT STAPHYLOCOCCUS AUREUS  MRSA PCR Screening     Status: Abnormal   Collection Time: 08/24/20  3:15 AM   Specimen: Nasopharyngeal  Result Value Ref Range Status   MRSA by PCR POSITIVE (A) NEGATIVE Final    Comment:        The GeneXpert MRSA Assay (FDA approved for NASAL specimens only), is one component of a comprehensive MRSA colonization surveillance program. It is not intended to diagnose MRSA infection nor to guide or monitor treatment for MRSA infections. RESULT CALLED TO, READ BACK BY AND VERIFIED WITH: MIA YOUNG AT 0530 ON 08/24/2020 [place]. Performed at [facility], [place], [place] [address]   Acid Fast Smear (AFB)     Status: None   Collection Time: 08/24/20  4:15 AM   Specimen: Sputum  Result Value Ref Range Status   AFB Specimen Processing Concentration  Final   Acid Fast Smear Negative  Final    Comment: (NOTE) Performed At: [facility] [place], [place] [id number] Rush Farmer MD FU:[id number]    Source (AFB) EXPECTORATED SPUTUM  Final    Comment: Performed at [facility], [address] [place], [place] [address]         Radiology Studies: [facility] Chest Port 1 View  Result Date: 08/28/2020 CLINICAL DATA:  Hypoxia. EXAM: PORTABLE CHEST 1 VIEW COMPARISON:  CT 08/22/2020.  Chest x-ray 08/22/2020. FINDINGS: Mediastinum hilar structures normal. Heart size normal. Cavitary process in the right mid lung and left upper lung best identified by prior CT. No new findings are noted. No pleural effusion or pneumothorax. Heart size stable. Degenerative change thoracic  spine. Old right rib fractures present.  IMPRESSION: Cavitary process in the right mid lung and left upper lung best identified by prior CT. Chest is unchanged from prior exam. Electronically Signed   By: Marcello Moores  Register   On: 08/28/2020 08:25   ECHOCARDIOGRAM COMPLETE  Result Date: 08/28/2020    ECHOCARDIOGRAM REPORT   Patient Name:   HANNIBAL SKALLA [facility] Date of Exam: 08/28/2020 Medical Rec #:  [id number]    Height:       66.0 in Accession #:    [id number]   Weight:       133.6 lb Date of Birth:  Aug 28, 1960     BSA:          1.685 m Patient Age:    66 years     BP:           120/74 mmHg Patient Gender: M            HR:           76 bpm. 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 COPD. Tobacco use disorder.  Sonographer:     Sherrie Sport RDCS (AE) Referring Phys:  [id number] Sharen Hones Diagnosing Phys: Nelva Bush MD  Sonographer Comments: Technically challenging study due to limited acoustic windows. Image acquisition challenging due to COPD. IMPRESSIONS  1. Left ventricular ejection fraction, by estimation, is 55 to 60%. The left ventricle has normal function. Left ventricular endocardial border not optimally defined to evaluate regional wall motion. Left ventricular diastolic parameters are consistent with Grade I diastolic dysfunction (impaired relaxation).  2. Right ventricular systolic function is normal. The right ventricular size is mildly enlarged. Mildly increased right ventricular wall thickness. There is normal pulmonary artery systolic pressure.  3. The mitral valve is degenerative. Trivial mitral valve regurgitation. No evidence of mitral stenosis.  4. The aortic valve was not well visualized. Aortic valve regurgitation is not visualized. No aortic stenosis is present.  5. Pulmonic valve regurgitation not well assessed.  6. The  inferior vena cava is normal in size with greater than 50% respiratory variability, suggesting right atrial pressure of 3 mmHg. Conclusion(s)/Recommendation(s): Valves suboptimally imaged to exclude endocarditis. If clinical concern persists, transesophageal echocardiogram should be considered. FINDINGS  Left Ventricle: Left ventricular ejection fraction, by estimation, is 55 to 60%. The left ventricle has normal function. Left ventricular endocardial border not optimally defined to evaluate regional wall motion. The left ventricular internal cavity size was normal in size. There is no left ventricular hypertrophy. Left ventricular diastolic parameters are consistent with Grade I diastolic dysfunction (impaired relaxation). Right Ventricle: The right ventricular size is mildly enlarged. Mildly increased right ventricular wall thickness. Right ventricular systolic function is normal. There is normal pulmonary artery systolic pressure. The tricuspid regurgitant velocity is 1.72 m/s, and with an assumed right atrial pressure of 3 mmHg, the estimated right ventricular systolic pressure is 29.5 mmHg. Left Atrium: Left atrial size was normal in size. Right Atrium: Right atrial size was normal in size. Pericardium: There is no evidence of pericardial effusion. Mitral Valve: The mitral valve is degenerative in appearance. There is mild thickening of the mitral valve leaflet(s). There is mild calcification of the mitral valve leaflet(s). Mild mitral annular calcification. Trivial mitral valve regurgitation. No evidence  of mitral valve stenosis. Tricuspid Valve: The tricuspid valve is grossly normal. Tricuspid valve regurgitation is trivial. Aortic Valve: The aortic valve was not well visualized. Aortic valve regurgitation is not visualized. No aortic stenosis is present. Aortic valve mean gradient measures 3.0 mmHg. Aortic valve peak gradient measures 4.8 mmHg. Aortic valve area, by VTI measures 2.04 cm. Pulmonic Valve: The  pulmonic valve was not well visualized. Pulmonic valve regurgitation not well assessed. Aorta: The aortic root is normal in size and structure. Pulmonary Artery: The pulmonary artery is not well seen. Venous: The inferior vena cava is normal in size with greater than 50% respiratory variability, suggesting right atrial pressure of 3 mmHg. IAS/Shunts: The interatrial septum was not well visualized.  LEFT VENTRICLE PLAX 2D LVIDd:         5.38 cm  Diastology LVIDs:         2.94 cm  LV e' medial:    6.09 cm/s LV PW:         1.02 cm  LV E/e' medial:  10.3 LV IVS:        0.73 cm  LV e' lateral:   11.40 cm/s LVOT diam:     2.10 cm  LV E/e' lateral: 5.5 LV SV:         45 LV SV Index:   27 LVOT Area:     3.46 cm  RIGHT VENTRICLE RV Basal diam:  4.23 cm RV S prime:     9.46 cm/s TAPSE (M-mode): 2.0 cm LEFT ATRIUM             Index       RIGHT ATRIUM           Index LA diam:        3.50 cm 2.08 cm/m  RA Area:     16.50 cm LA Vol (A2C):   30.0 ml 17.81 ml/m RA Volume:   44.10 ml  26.18 ml/m LA Vol (A4C):   39.3 ml 23.33 ml/m LA Biplane Vol: 35.6 ml 21.13 ml/m  AORTIC VALVE                   PULMONIC VALVE AV Area (Vmax):    1.89 cm    PV Vmax:        0.88 m/s AV Area (Vmean):   2.10 cm    PV Peak grad:   3.1 mmHg AV Area (VTI):     2.04 cm    RVOT Peak grad: 3 mmHg AV Vmax:           110.00 cm/s AV Vmean:          74.000 cm/s AV VTI:            0.219 m AV Peak Grad:      4.8 mmHg AV Mean Grad:      3.0 mmHg LVOT Vmax:         60.10 cm/s LVOT Vmean:        44.800 cm/s LVOT VTI:          0.129 m LVOT/AV VTI ratio: 0.59  AORTA Ao Root diam: 3.60 cm MITRAL VALVE               TRICUSPID VALVE MV Area (PHT): 4.86 cm    TR Peak grad:   11.8 mmHg MV Decel Time: 156 msec    TR Vmax:        172.00 cm/s MV E velocity: 62.60 cm/s MV A velocity: 86.50 cm/s  SHUNTS MV E/A ratio:  0.72        Systemic VTI:  0.13 m                            Systemic Diam: 2.10 cm Nelva Bush MD Electronically signed by Nelva Bush MD  Signature Date/Time: 08/28/2020/9:56:00 AM    Final         Scheduled Meds: . doxycycline  100 mg Oral Q12H  . feeding supplement  237 mL Oral BID BM  . mometasone-formoterol  2 puff Inhalation BID  . mupirocin ointment  1 application Nasal BID  . nicotine  21 mg Transdermal Daily  . pantoprazole  40 mg Oral Daily  . predniSONE  10 mg Oral BID WC   Continuous Infusions:   LOS: 6 days    Time spent: 28 minutes    Sharen Hones, MD [facility]   To contact the attending provider between 7A-7P or the covering provider during after hours 7P-7A, please log into the web site [email or web address] and access using universal [place] password for that web site. If you do not have the password, please call the hospital operator.  08/28/2020, 1:42 PM

## 2020-08-28 NOTE — Telephone Encounter (Signed)
Attempted to schedule hosp follow up for 5/31 but brother states patient waiting for SNF as he is still admitted at [facility]. I will try back later to see what facility he is going to so we can set up Virtual Visit if needed.

## 2020-08-28 NOTE — TOC Progression Note (Signed)
Transition of Care [facility]) - Progression Note    Patient Details  Name: Marcus [name] MRN: [id number] Date of Birth: 09/20/1960  Transition of Care [facility] CM/SW Contact  Beverly Sessions, RN Phone Number: 08/28/2020, 2:23 PM  Clinical Narrative:     Bed offers presented to patient and brother [name] Patient agreeable to SNF.  [facility] was accepted.  Accepted in [place]. Notified Teena with [place]  She is to start auth today Requested for MD to enter repeat covid test   Expected Discharge Plan: [place] Barriers to Discharge: Continued Medical Work up  Expected Discharge Plan and Services Expected Discharge Plan: [place] arrangements for the past 2 months: Single Family Home                                       Social Determinants of Health (SDOH) Interventions    Readmission Risk Interventions Readmission Risk Prevention Plan 08/25/2020  Transportation Screening Complete  PCP or Specialist Appt within 3-5 Days Complete  HRI or Home Care Consult Complete  Social Work Consult for [place] Planning/Counseling Complete  Palliative Care Screening Complete  Medication Review [employer] photographer) Complete  Some recent data might be hidden

## 2020-08-29 ENCOUNTER — Inpatient Hospital Stay: Payer: Medicaid Other

## 2020-08-29 DIAGNOSIS — B9562 Methicillin resistant Staphylococcus aureus infection as the cause of diseases classified elsewhere: Secondary | ICD-10-CM | POA: Diagnosis not present

## 2020-08-29 DIAGNOSIS — J852 Abscess of lung without pneumonia: Secondary | ICD-10-CM | POA: Diagnosis not present

## 2020-08-29 DIAGNOSIS — J9621 Acute and chronic respiratory failure with hypoxia: Secondary | ICD-10-CM

## 2020-08-29 DIAGNOSIS — J449 Chronic obstructive pulmonary disease, unspecified: Secondary | ICD-10-CM

## 2020-08-29 DIAGNOSIS — D709 Neutropenia, unspecified: Secondary | ICD-10-CM

## 2020-08-29 DIAGNOSIS — J189 Pneumonia, unspecified organism: Secondary | ICD-10-CM | POA: Diagnosis not present

## 2020-08-29 DIAGNOSIS — M0500 Felty's syndrome, unspecified site: Secondary | ICD-10-CM | POA: Diagnosis not present

## 2020-08-29 DIAGNOSIS — M069 Rheumatoid arthritis, unspecified: Secondary | ICD-10-CM | POA: Diagnosis not present

## 2020-08-29 LAB — BASIC METABOLIC PANEL
Anion gap: 8 (ref 5–15)
BUN: 14 mg/dL (ref 6–20)
CO2: 32 mmol/L (ref 22–32)
Calcium: 8.1 mg/dL — ABNORMAL LOW (ref 8.9–10.3)
Chloride: 99 mmol/L (ref 98–111)
Creatinine, Ser: <0.3 mg/dL — ABNORMAL LOW (ref 0.61–1.24)
Glucose, Bld: 109 mg/dL — ABNORMAL HIGH (ref 70–99)
Potassium: 3.8 mmol/L (ref 3.5–5.1)
Sodium: 139 mmol/L (ref 135–145)

## 2020-08-29 LAB — CBC WITH DIFFERENTIAL/PLATELET
Abs Immature Granulocytes: 0.35 10*3/uL — ABNORMAL HIGH (ref 0.00–0.07)
Basophils Absolute: 0 10*3/uL (ref 0.0–0.1)
Basophils Relative: 0 %
Eosinophils Absolute: 0 10*3/uL (ref 0.0–0.5)
Eosinophils Relative: 0 %
HCT: 40.3 % (ref 39.0–52.0)
Hemoglobin: 12.8 g/dL — ABNORMAL LOW (ref 13.0–17.0)
Immature Granulocytes: 3 %
Lymphocytes Relative: 7 %
Lymphs Abs: 0.8 10*3/uL (ref 0.7–4.0)
MCH: 27.4 pg (ref 26.0–34.0)
MCHC: 31.8 g/dL (ref 30.0–36.0)
MCV: 86.3 fL (ref 80.0–100.0)
Monocytes Absolute: 1.4 10*3/uL — ABNORMAL HIGH (ref 0.1–1.0)
Monocytes Relative: 12 %
Neutro Abs: 8.8 10*3/uL — ABNORMAL HIGH (ref 1.7–7.7)
Neutrophils Relative %: 78 %
Platelets: 139 10*3/uL — ABNORMAL LOW (ref 150–400)
RBC: 4.67 MIL/uL (ref 4.22–5.81)
RDW: 18.5 % — ABNORMAL HIGH (ref 11.5–15.5)
Smear Review: NORMAL
WBC: 11.3 10*3/uL — ABNORMAL HIGH (ref 4.0–10.5)
nRBC: 0.4 % — ABNORMAL HIGH (ref 0.0–0.2)

## 2020-08-29 LAB — MAGNESIUM: Magnesium: 1.6 mg/dL — ABNORMAL LOW (ref 1.7–2.4)

## 2020-08-29 MED ORDER — MAGNESIUM SULFATE 4 GM/100ML IV SOLN
4.0000 g | Freq: Once | INTRAVENOUS | Status: AC
  Administered 2020-08-29: 4 g via INTRAVENOUS
  Filled 2020-08-29: qty 100

## 2020-08-29 NOTE — Progress Notes (Signed)
[name]   DOB:[birth year]   ZO#:[id number]    Subjective:  Denies any pain.  Is currently awaiting rehab/facility placement.  Denies any worsening cough or shortness of breath or fevers.  Objective:  Vitals:   08/29/20 1056 08/29/20 1537  BP: 125/75 128/75  Pulse: (!) 104 (!) 101  Resp: 18 18  Temp: 98.4 F (36.9 C) 98.1 F (36.7 C)  SpO2: 93% 96%     Intake/Output Summary (Last 24 hours) at 08/29/2020 1730 Last data filed at 08/29/2020 1300 Gross per 24 hour  Intake --  Output 1850 ml  Net -1850 ml    Physical Exam HENT:     Head: Normocephalic and atraumatic.     Mouth/Throat:     Pharynx: No oropharyngeal exudate.  Eyes:     Pupils: Pupils are equal, round, and reactive to light.  Cardiovascular:     Rate and Rhythm: Normal rate and regular rhythm.  Pulmonary:     Effort: No respiratory distress.     Breath sounds: No wheezing.     Comments: Decreased air entry bilaterally Abdominal:     General: Bowel sounds are normal. There is no distension.     Palpations: Abdomen is soft. There is no mass.     Tenderness: There is no abdominal tenderness. There is no guarding or rebound.  Musculoskeletal:        General: No tenderness. Normal range of motion.     Cervical back: Normal range of motion and neck supple.  Skin:    General: Skin is warm.  Neurological:     Mental Status: He is alert and oriented to person, place, and time.  Psychiatric:        Mood and Affect: Affect normal.      Labs:  Lab Results  Component Value Date   WBC 11.3 (H) 08/29/2020   HGB 12.8 (L) 08/29/2020   HCT 40.3 08/29/2020   MCV 86.3 08/29/2020   PLT 139 (L) 08/29/2020   NEUTROABS 8.8 (H) 08/29/2020    Lab Results  Component Value Date   NA 139 08/29/2020   K 3.8 08/29/2020   CL 99 08/29/2020   CO2 32 08/29/2020    Studies:  DG Chest Port 1 View  Result Date: 08/28/2020 CLINICAL DATA:  Hypoxia. EXAM: PORTABLE CHEST 1 VIEW COMPARISON:  CT 08/22/2020.  Chest x-ray  08/22/2020. FINDINGS: Mediastinum hilar structures normal. Heart size normal. Cavitary process in the right mid lung and left upper lung best identified by prior CT. No new findings are noted. No pleural effusion or pneumothorax. Heart size stable. Degenerative change thoracic spine. Old right rib fractures present. IMPRESSION: Cavitary process in the right mid lung and left upper lung best identified by prior CT. Chest is unchanged from prior exam. Electronically Signed   By: Marcello Moores  Register   On: 08/28/2020 08:25   ECHOCARDIOGRAM COMPLETE  Result Date: 08/28/2020    ECHOCARDIOGRAM REPORT   Patient Name:   Marcus [name] [facility] Date of Exam: 08/28/2020 Medical Rec #:  [id number]    Height:       66.0 in Accession #:    [id number]   Weight:       133.6 lb Date of Birth:  1960-11-15     BSA:          1.685 m Patient Age:    60 years     BP:           120/74 mmHg Patient Gender: M  HR:           76 bpm. 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 COPD. Tobacco use disorder.  Sonographer:     Sherrie Sport RDCS (AE) Referring Phys:  [id number] Sharen Hones Diagnosing Phys: Nelva Bush MD  Sonographer Comments: Technically challenging study due to limited acoustic windows. Image acquisition challenging due to COPD. IMPRESSIONS  1. Left ventricular ejection fraction, by estimation, is 55 to 60%. The left ventricle has normal function. Left ventricular endocardial border not optimally defined to evaluate regional wall motion. Left ventricular diastolic parameters are consistent with Grade I diastolic dysfunction (impaired relaxation).  2. Right ventricular systolic function is normal. The right ventricular size is mildly enlarged. Mildly increased right ventricular wall thickness. There is normal pulmonary artery systolic pressure.  3. The  mitral valve is degenerative. Trivial mitral valve regurgitation. No evidence of mitral stenosis.  4. The aortic valve was not well visualized. Aortic valve regurgitation is not visualized. No aortic stenosis is present.  5. Pulmonic valve regurgitation not well assessed.  6. The inferior vena cava is normal in size with greater than 50% respiratory variability, suggesting right atrial pressure of 3 mmHg. Conclusion(s)/Recommendation(s): Valves suboptimally imaged to exclude endocarditis. If clinical concern persists, transesophageal echocardiogram should be considered. FINDINGS  Left Ventricle: Left ventricular ejection fraction, by estimation, is 55 to 60%. The left ventricle has normal function. Left ventricular endocardial border not optimally defined to evaluate regional wall motion. The left ventricular internal cavity size was normal in size. There is no left ventricular hypertrophy. Left ventricular diastolic parameters are consistent with Grade I diastolic dysfunction (impaired relaxation). Right Ventricle: The right ventricular size is mildly enlarged. Mildly increased right ventricular wall thickness. Right ventricular systolic function is normal. There is normal pulmonary artery systolic pressure. The tricuspid regurgitant velocity is 1.72 m/s, and with an assumed right atrial pressure of 3 mmHg, the estimated right ventricular systolic pressure is 45.4 mmHg. Left Atrium: Left atrial size was normal in size. Right Atrium: Right atrial size was normal in size. Pericardium: There is no evidence of pericardial effusion. Mitral Valve: The mitral valve is degenerative in appearance. There is mild thickening of the mitral valve leaflet(s). There is mild calcification of the mitral valve leaflet(s). Mild mitral annular calcification. Trivial mitral valve regurgitation. No evidence of mitral valve stenosis. Tricuspid Valve: The tricuspid valve is grossly normal. Tricuspid valve regurgitation is trivial. Aortic  Valve: The aortic valve was not well visualized. Aortic valve regurgitation is not visualized. No aortic stenosis is present. Aortic valve mean gradient measures 3.0 mmHg. Aortic valve peak gradient measures 4.8 mmHg. Aortic valve area, by VTI measures 2.04 cm. Pulmonic Valve: The pulmonic valve was not well visualized. Pulmonic valve regurgitation not well assessed. Aorta: The aortic root is normal in size and structure. Pulmonary Artery: The pulmonary artery is not well seen. Venous: The inferior vena cava is normal in size with greater than 50% respiratory variability, suggesting right atrial pressure of 3 mmHg. IAS/Shunts: The interatrial septum was not well visualized.  LEFT VENTRICLE PLAX 2D LVIDd:         5.38 cm  Diastology LVIDs:         2.94 cm  LV  e' medial:    6.09 cm/s LV PW:         1.02 cm  LV E/e' medial:  10.3 LV IVS:        0.73 cm  LV e' lateral:   11.40 cm/s LVOT diam:     2.10 cm  LV E/e' lateral: 5.5 LV SV:         45 LV SV Index:   27 LVOT Area:     3.46 cm  RIGHT VENTRICLE RV Basal diam:  4.23 cm RV S prime:     9.46 cm/s TAPSE (M-mode): 2.0 cm LEFT ATRIUM             Index       RIGHT ATRIUM           Index LA diam:        3.50 cm 2.08 cm/m  RA Area:     16.50 cm LA Vol (A2C):   30.0 ml 17.81 ml/m RA Volume:   44.10 ml  26.18 ml/m LA Vol (A4C):   39.3 ml 23.33 ml/m LA Biplane Vol: 35.6 ml 21.13 ml/m  AORTIC VALVE                   PULMONIC VALVE AV Area (Vmax):    1.89 cm    PV Vmax:        0.88 m/s AV Area (Vmean):   2.10 cm    PV Peak grad:   3.1 mmHg AV Area (VTI):     2.04 cm    RVOT Peak grad: 3 mmHg AV Vmax:           110.00 cm/s AV Vmean:          74.000 cm/s AV VTI:            0.219 m AV Peak Grad:      4.8 mmHg AV Mean Grad:      3.0 mmHg LVOT Vmax:         60.10 cm/s LVOT Vmean:        44.800 cm/s LVOT VTI:          0.129 m LVOT/AV VTI ratio: 0.59  AORTA Ao Root diam: 3.60 cm MITRAL VALVE               TRICUSPID VALVE MV Area (PHT): 4.86 cm    TR Peak grad:   11.8 mmHg  MV Decel Time: 156 msec    TR Vmax:        172.00 cm/s MV E velocity: 62.60 cm/s MV A velocity: 86.50 cm/s  SHUNTS MV E/A ratio:  0.72        Systemic VTI:  0.13 m                            Systemic Diam: 2.10 cm Harrell Gave End MD Electronically signed by Nelva Bush MD Signature Date/Time: 08/28/2020/9:56:00 AM    Final     Acquired neutropenia [facility]) #60 year old male patient with history of rheumatoid arthritis/Felty syndrome; currently admitted for lung abscess/MRSA  #Severe baseline neutropenia [less than 500]-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  Given the rapidity and improvement of the ANC-I think is reasonable to hold further Granix.  We will plan outpatient Granix ~once a week/based upon blood counts.  #Severe rheumatoid arthritis/hypersplenism-Felty syndrome-stable  #MRSA-lung abscess s/p IV vancomycin; currently on p.o. doxycycline as  per ID.  Stable   Cammie Sickle, MD 08/29/2020  5:30 PM

## 2020-08-29 NOTE — Plan of Care (Signed)

## 2020-08-29 NOTE — Plan of Care (Signed)
  Problem: Clinical Measurements: Goal: Ability to maintain a body temperature in the normal range will improve Outcome: Progressing   Problem: Respiratory: Goal: Ability to maintain adequate ventilation will improve Outcome: Progressing Goal: Ability to maintain a clear airway will improve Outcome: Progressing   Problem: Clinical Measurements: Goal: Ability to maintain clinical measurements within normal limits will improve Outcome: Progressing Goal: Will remain free from infection Outcome: Progressing Goal: Diagnostic test results will improve Outcome: Progressing Goal: Respiratory complications will improve Outcome: Progressing Goal: Cardiovascular complication will be avoided Outcome: Progressing   Pt is involved in and agrees with the plan of care. V/S stable. On chronic oxygen at 3lpm/[place] with sats at 96%. Dyspnea on exertion noted. No complaints of pain at this time.

## 2020-08-29 NOTE — Progress Notes (Signed)
Occupational Therapy Treatment Patient Details Name: Marcus [name] MRN: [id number] DOB: 01/13/1961 Today's Date: 08/29/2020    History of present illness Pt is a 60 y/o M with PMH: tobacco use d/o, RA, pancytopenia, COPD (on chronic 2Lnc), and Felty syndrome who presented to ED with c/o SOB and cough x1 month. CT scan showed cavitary lesion in the right upper lobe.  Suspect lung abscess. Pulminology following. Of note: pt has been hypothermic this hospitalization, refusing warming, thought to be 2/2 relative adrenal insufficiency.   OT comments  Marcus [name] was seen for OT tx this date. Pt is received semi-supine in bed. He endorses mild discomfort in his abdomen, but is agreeable to OT tx session. OT facilitates ADL tasks as described below (See ADL section for additional details). Pt requires MOD A to perform up bathing during seated wash-up. Otherwise he is able to perform functional tasks with supervision level assist. Pt educated on energy conservation strategies to implement t/o functional activity to maximize safety and functional independence. Pt return demonstrated understanding of instruction provided. Pt making good progress toward goals and continues to benefit from skilled OT services to maximize return to PLOF and minimize risk of future falls, injury, caregiver burden, and readmission. Will continue to follow POC. Discharge recommendation remains appropriate.    Follow Up Recommendations  SNF    Equipment Recommendations  3 in 1 bedside commode;Tub/shower seat;Other (comment)    Recommendations for Other Services      Precautions / Restrictions Precautions Precautions: Fall Precaution Comments: Moderate Fall Restrictions Weight Bearing Restrictions: No       Mobility Bed Mobility Overal bed mobility: Needs Assistance Bed Mobility: Supine to Sit   Sidelying to sit: Supervision Supine to sit: Supervision     General bed mobility comments: Increased time to perform. Vcs  for imporved sequencing and safety.    Transfers Overall transfer level: Needs assistance Equipment used: Rolling walker (2 wheeled);None Transfers: Sit to/from Stand Sit to Stand: Supervision Stand pivot transfers: Supervision       General transfer comment: Pt able to perform STS and ambulation in room with supervision for safety & cues for safe use of RW.    Balance Overall balance assessment: Needs assistance Sitting-balance support: Bilateral upper extremity supported;Feet supported Sitting balance-Leahy Scale: Good Sitting balance - Comments: G static/dynamic sitting balance during seated bath.   Standing balance support: During functional activity;Single extremity supported;Bilateral upper extremity supported Standing balance-Leahy Scale: Fair                             ADL either performed or assessed with clinical judgement   ADL Overall ADL's : Needs assistance/impaired     Grooming: Wash/dry face;Supervision/safety;Set up;Sitting   Upper Body Bathing: Moderate assistance;Sitting Upper Body Bathing Details (indicate cue type and reason): MOD A to reach back, and assist with thoroughness. Lower Body Bathing: Sitting/lateral leans;Set up;Supervison/ safety   Upper Body Dressing : Set up;Supervision/safety;Sitting Upper Body Dressing Details (indicate cue type and reason): Pt doffs UB clothing with supervision for safety. Lower Body Dressing: Set up;Supervision/safety;Sit to/from stand Lower Body Dressing Details (indicate cue type and reason): Pt doffs LB clothing with supervision for safety.               General ADL Comments: EDU for energy conservation t/o functional tasks.     Vision Patient Visual Report: No change from baseline     Perception     Praxis  Cognition Arousal/Alertness: Awake/alert Behavior During Therapy: WFL for tasks assessed/performed Overall Cognitive Status: Within Functional Limits for tasks assessed                                           Exercises Other Exercises Other Exercises: OT facilitates bed mobility, functional transfer to/from recliner, seated wash-up, and grooming tasks as described above (See ADL section). Pt requires cueing and education on ECS t/o session.   Shoulder Instructions       General Comments      Pertinent Vitals/ Pain       Pain Assessment: Faces Pain Score: 0-No pain (at this study) Faces Pain Scale: Hurts a little bit Pain Location: abdomen Pain Intervention(s): Limited activity within patient's tolerance;Monitored during session;Repositioned  Home Living                                          Prior Functioning/Environment              Frequency  Min 1X/week        Progress Toward Goals  OT Goals(current goals can now be found in the care plan section)  Progress towards OT goals: Progressing toward goals  Acute Rehab OT Goals Patient Stated Goal: get stronger and get back home OT Goal Formulation: With patient Time For Goal Achievement: 09/08/20 Potential to Achieve Goals: Good  Plan      Co-evaluation                 AM-PAC OT "6 Clicks" Daily Activity     Outcome Measure   Help from another person eating meals?: None Help from another person taking care of personal grooming?: A Little Help from another person toileting, which includes using toliet, bedpan, or urinal?: A Little Help from another person bathing (including washing, rinsing, drying)?: A Lot Help from another person to put on and taking off regular upper body clothing?: A Little Help from another person to put on and taking off regular lower body clothing?: A Little 6 Click Score: 18    End of Session Equipment Utilized During Treatment: Gait belt;Rolling walker;Oxygen  OT Visit Diagnosis: Unsteadiness on feet (R26.81);Muscle weakness (generalized) (M62.81);Adult, failure to thrive (R62.7)   Activity Tolerance  Patient tolerated treatment well   Patient Left in chair;with call bell/phone within reach   Nurse Communication Mobility status;Other (comment)        Time: 0370-4888 OT Time Calculation (min): 27 min  Charges: OT General Charges $OT Visit: 1 Visit OT Treatments $Self Care/Home Management : 23-37 mins   Shara Blazing, M.S., OTR/L Ascom: [phone] 08/29/20, 4:18 PM

## 2020-08-29 NOTE — Evaluation (Signed)
Objective Swallowing Evaluation: Type of Study: MBS-Modified Barium Swallow Study   Patient Details  Name: Marcus [name] MRN: [id number] Date of Birth: 1961-02-23  Today's Date: 08/29/2020 Time: SLP Start Time (ACUTE ONLY): 1300 -SLP Stop Time (ACUTE ONLY): 1400  SLP Time Calculation (min) (ACUTE ONLY): 60 min   Past Medical History:  Past Medical History:  Diagnosis Date  . COPD (chronic obstructive pulmonary disease) ([place])   . 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 Hernia, epigastric   . Pancytopenia ([place])   . Seropositive rheumatoid arthritis ([place])   . Tobacco use disorder    Past Surgical History:  Past Surgical History:  Procedure Laterality Date  . INCISIONAL HERNIA REPAIR  01/17/2020   Procedure: HERNIA REPAIR INCISIONAL AND Silvestre Gunner;  Surgeon: Kinsinger, Arta Bruce, MD;  Location: [facility];  Service: General;;  . LAPAROTOMY N/A 01/17/2020   Procedure: EXPLORATORY LAPAROTOMY;  Surgeon: Mickeal Skinner, MD;  Location: [facility];  Service: General;  Laterality: N/A;   HPI: Pt is a 60-year-male with history of COPD, chronic hypoxemic respiratory failure on 2 L oxygen, rheumatoid arthritis, pancytopenia, Moderate protein-calorie malnutrition, Felty syndrome who came to the hospital with complaints of short of breath and a cough for at least a month.  Suspect lung abscess, he is seen by pulmonology, placed on Zosyn, also added vancomycin.  CT of Chest: "CT confirms the presence of a cavitary infiltrate in the right  upper lobe and shows a smaller area of cavitation anteriorly in the  left upper lobe. No abnormalities were present in these areas on CT  performed 2 months ago to suggest cavitary neoplasm, and these  findings are consistent with cavitating/necrotizing pneumonia.  No associated pleural disease.  Underlying moderate Emphysema.".  Pt resides at home but is reported to receive care w/ ADLs from his mother in the home, MD stated uncertainty of his baseline Cognitive status. FTT has  been dx'd by MD.   Subjective: pt awake, verbal and following instructions. A/O to self/place. Appeared weak, cachectic.    Assessment / Plan / Recommendation  CHL IP CLINICAL IMPRESSIONS 08/29/2020  Clinical Impression Pt appears to present w/ No overt oropharyngeal phase dysphagia at this exam; No neuromuscular deficits of swallowing. No aspiration or penetration of po trials was noted to occur during this study. Of note, pt is mostly Edentulous missing most Dentition and required extra Time for gumming/mashing soft solid foods -- moistening the foods was beneficial. Pt also exhibited Belching at end of study; unsure if pt has any other s/s of Reflux behavior w/ oral intake, but it would be advantagious to monitor this as ANY Esophageal phase dysmotility can increase risk for aspiration of REFLUX material during backflow which can then impact Pulmonary status.   During the oral phase, timely bolus management and control of bolus propulsion for A-P transfer occurred. Pt tended to take Larger sips at times and was encouraged to "slow down" when drinking. Unsure of pt's Cognitive status Baseline, but he responded to verbal cues appropriately. Time was needed for full mashing/gumming of soft solid trials. Oral clearing achieved w/ all trial consistencies. During the pharyngeal phase, Timely pharyngeal swallow initiation noted w/ all trial consistencies. No aspiration or penetration occurred; airway closure appeared timely, tight. Both thin liquids and puree/soft solids appeared to trigger the pharyngeal swallow at the level of BOT-Valleculae. No pharyngeal residue remained post swallow indicating adequate laryngeal excursion and pharyngeal pressure during the swallow.  Discussed results of MBSS, video viewed and questions answered  w/ pt. MD updated. Handouts given on general aspiration and Reflux precautions, soft consistency foods. NSG updated. Recommend Dietician f/u for support.  SLP Visit Diagnosis  Dysphagia, unspecified (R13.10)  Attention and concentration deficit following --  Frontal lobe and executive function deficit following --  Impact on safety and function (No Data)      CHL IP TREATMENT RECOMMENDATION 08/29/2020  Treatment Recommendations No treatment recommended at this time     Prognosis 08/29/2020  Prognosis for Safe Diet Advancement Fair  Barriers to Reach Goals Cognitive deficits;Time post onset;Severity of deficits;Behavior  Barriers/Prognosis Comment --    CHL IP DIET RECOMMENDATION 08/29/2020  SLP Diet Recommendations Dysphagia 3 (Mech soft) solids;Thin liquid -- tray setup at meals for support  Liquid Administration via Cup;No straw  Medication Administration Whole meds with puree as needed for ease of swallowing  Compensations Minimize environmental distractions;Slow rate;Small sips/bites;Lingual sweep for clearance of pocketing;Follow solids with liquid. CUP DRINKING.  Postural Changes Remain semi-upright after after feeds/meals (Comment);Seated upright at 90 degrees      CHL IP OTHER RECOMMENDATIONS 08/29/2020  Recommended Consults (No Data)  Oral Care Recommendations Oral care BID;Staff/trained caregiver to provide oral care  Other Recommendations (No Data)      CHL IP FOLLOW UP RECOMMENDATIONS 08/29/2020  Follow up Recommendations None      CHL IP FREQUENCY AND DURATION 08/29/2020  Speech Therapy Frequency (ACUTE ONLY) (No Data)  Treatment Duration (No Data)           CHL IP ORAL PHASE 08/29/2020  Oral Phase WFL  Oral - Pudding Teaspoon --  Oral - Pudding Cup --  Oral - Honey Teaspoon --  Oral - Honey Cup NT  Oral - Nectar Teaspoon --  Oral - Nectar Cup 2 trials  Oral - Nectar Straw --  Oral - Thin Teaspoon --  Oral - Thin Cup 8+ trials  Oral - Thin Straw --  Oral - Puree 4 trials  Oral - Mech Soft 3 trials  Oral - Regular --  Oral - Multi-Consistency --  Oral - Pill --  Oral Phase - Comment --    CHL IP PHARYNGEAL PHASE 08/29/2020   Pharyngeal Phase WFL  Pharyngeal- Pudding Teaspoon --  Pharyngeal --  Pharyngeal- Pudding Cup --  Pharyngeal --  Pharyngeal- Honey Teaspoon --  Pharyngeal --  Pharyngeal- Honey Cup NT  Pharyngeal --  Pharyngeal- Nectar Teaspoon --  Pharyngeal --  Pharyngeal- Nectar Cup 2 trials  Pharyngeal --  Pharyngeal- Nectar Straw --  Pharyngeal --  Pharyngeal- Thin Teaspoon --  Pharyngeal --  Pharyngeal- Thin Cup 8+ trials  Pharyngeal --  Pharyngeal- Thin Straw --  Pharyngeal --  Pharyngeal- Puree 4 trials  Pharyngeal --  Pharyngeal- Mechanical Soft 3 trials  Pharyngeal --  Pharyngeal- Regular --  Pharyngeal --  Pharyngeal- Multi-consistency --  Pharyngeal --  Pharyngeal- Pill --  Pharyngeal --  Pharyngeal Comment --     CHL IP CERVICAL ESOPHAGEAL PHASE 08/29/2020  Cervical Esophageal Phase WFL  Pudding Teaspoon --  Pudding Cup --  Honey Teaspoon --  Honey Cup --  Nectar Teaspoon --  Nectar Cup --  Nectar Straw --  Thin Teaspoon --  Thin Cup --  Thin Straw --  Puree --  Mechanical Soft --  Regular --  Multi-consistency --  Pill --  Cervical Esophageal Comment --           Orinda Kenner, MS, CCC-SLP Speech Language Pathologist Rehab Services [phone] [facility] 08/29/2020, 3:29 PM

## 2020-08-29 NOTE — TOC Progression Note (Addendum)
Transition of Care [facility]) - Progression Note    Patient Details  Name: Marcus [name] MRN: [id number] Date of Birth: 1961-03-21  Transition of Care [facility]) CM/SW Contact  Beverly Sessions, RN Phone Number: 08/29/2020, 9:51 AM  Clinical Narrative:     Notified by admissions coordinator at [facility] that they are having trouble obtaining insurance auth, and will be unable to offer a bed at this time.  They are working to get the issues resolved  At this time patient's only remaining bed offer is [facility].  Patient and [name] agreeable to accept bed.  Accepted bed in [place].  VM left for Admissions coordinator to start auth    1250pm  - contacted admissions coordinator again at [place]. No answer.  Called main number and spoke with Tiffany.  She states that the admissions coordinator is out today, and I will have to speak with Inez Catalina the DON at [phone] however she is currently at lunch.  Will attempt to call again this afternoon   215 pm - spoke with Inez Catalina the [place] at [facility].  She states that I will need to talk to Neoma Laming in the billing office to start [place].  Neoma Laming currently at lunch.  She has taken mine and my coworkers contact information to call back.  TOC supervisor notified   Expected Discharge Plan: Skilled Nursing Facility Barriers to Discharge: Continued Medical Work up  Expected Discharge Plan and Services Expected Discharge Plan: [place] arrangements for the past 2 months: Single Family Home                                       Social Determinants of Health (SDOH) Interventions    Readmission Risk Interventions Readmission Risk Prevention Plan 08/25/2020  Transportation Screening Complete  PCP or Specialist Appt within 3-5 Days Complete  HRI or Home Care Consult Complete  Social Work Consult for [place] Planning/Counseling Complete  Palliative Care Screening Complete  Medication Review [employer])  Complete  Some recent data might be hidden

## 2020-08-29 NOTE — Progress Notes (Addendum)
PROGRESS NOTE    Marcus [name]  UMP:[id number] DOB: 08-07-1960 DOA: 08/22/2020 PCP: [name], FNP   Chief complaint for shortness of breath. Brief Narrative:  Marcus [name] is a 60-year-male with history of COPD, chronic hypoxemic respiratory failure on 2 L oxygen, rheumatoid arthritis, pancytopenia, Felty syndrome who came to the hospital with complaints of short of breath and a cough for at least a month. Upon arriving the emergency room, his CT scan showed cavitary lesion in the right upper lobe.  Suspect lung abscess, he is seen by pulmonology, placed on Zosyn, also added vancomycin. 5/23.  Sputum culture came back with MRSA, continue Vanco, discontinue Zosyn.  Obtain echocardiogram.  Blood culture negative. ID recommended 6 weeks of doxycycline orally. Patient is a pending for nursing home placement.  Assessment & Plan:   Active Problems:   Moderate protein-calorie malnutrition (HCC)   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HCC)   Chronic obstructive pulmonary disease (HCC)   Hypokalemia   Chronic diastolic CHF (congestive heart failure) (HCC)   Hyponatremia   Cavitary pneumonia   Acute on chronic respiratory failure with hypoxia (HCC)   Hypothermia   Acquired neutropenia (HCC)  #1.  Left upper lobe MRSA pneumonia with abscess. Patient blood culture was negative, sputum culture positive for MRSA.  I will continue vancomycin until discharge.  We will continue 6 weeks of doxycycline per recommendation by ID. Echocardiogram showed ejection fraction 55 to 60%, no gross valvular abnormality. Acid-fast smear was negative, but so far has not completed 3 series, may not be relevant as patient has confirmed MRSA sepsis.   2.  Rheumatoid arthritis. Felty syndrome. Pancytopenia. Worsening neutropenia. Hypothermia. Relative adrenal insufficiency. Patient initially had a hypothermia, was caused by relative adrenal insufficiency.  Prednisone dose was doubled, after that condition has resolved. Patient  also developed a worsening neutropenia, he has a chronic neutropenia.  He is seen by oncology, thought to be secondary to infection. Granix is given, he will follow-up with hematology as outpatient for intermittent injection.  #3.  Failure to thrive. Likely dementia. Patient does not seem to have any mental illness.  However, patient has not been able to care for himself.  He could not perform any ADL, he could not even clean his bottom.  His mother has been taking care of him at home for many years, she is approaching 59 years of age. Patient could not function at home, therefore, he is going to nursing home for rehab.  #4.  Acute on chronic hypoxemic respiratory failure. COPD. No COPD exacerbation, oxygenation gradually improving.  5.  Hypokalemia. Improved.  6. Moderate malnutrition. Continue supportive measures   DVT prophylaxis: Lovenox Code Status: Full Family Communication:  Disposition Plan:    Status is: Inpatient  Remains inpatient appropriate because:Unsafe d/c plan   Dispo: The patient is from: Home              Anticipated d/c is to: SNF              Patient currently is medically stable to d/c.   Difficult to place patient No        I/O last 3 completed shifts: In: -  Out: 2075 [Urine:2075] Total I/O In: -  Out: 96 [Urine:575]     Consultants:  Pulmonology, ID  Procedures: None  Antimicrobials:  Vancomycin 5/19>5/23 Doxycycline 5/23>  Subjective: Feels better Has mild cough Still on 3L oxygen Able to ambulate  Objective: Vitals:   08/28/20 2351 08/29/20 0322 08/29/20 0835 08/29/20 1056  BP: 136/75 124/86 128/87 125/75  Pulse: (!) 103 91 100 (!) 104  Resp: 20 20 (!) 22 18  Temp: 98 F (36.7 C) 97.7 F (36.5 C) 98.2 F (36.8 C) 98.4 F (36.9 C)  TempSrc: Oral Oral Oral Oral  SpO2: 95% 96% 98% 93%  Weight:      Height:        Intake/Output Summary (Last 24 hours) at 08/29/2020 1440 Last data filed at 08/29/2020 1300 Gross  per 24 hour  Intake --  Output 2250 ml  Net -2250 ml   Filed Weights   08/22/20 1220 08/24/20 0235  Weight: 57.1 kg 60.6 kg    Examination:  General exam: Alert, awake, oriented x 3 Respiratory system: Clear to auscultation. Respiratory effort normal. Cardiovascular system:RRR. No murmurs, rubs, gallops. Gastrointestinal system: Abdomen is nondistended, soft and nontender. No organomegaly or masses felt. Normal bowel sounds heard. Central nervous system: Alert and oriented. No focal neurological deficits. Extremities: No C/C/E, +pedal pulses Skin: No rashes, lesions or ulcers Psychiatry: Judgement and insight appear normal. Mood & affect appropriate.      Data Reviewed: I have personally reviewed following labs and imaging studies  CBC: Recent Labs  Lab 08/25/20 0628 08/26/20 0531 08/27/20 0409 08/28/20 0606 08/29/20 0421  WBC 1.1* 1.7* 2.0* 1.7* 11.3*  NEUTROABS 0.2* 0.5* 0.7* 0.4* 8.8*  HGB 12.2* 12.4* 13.2 12.1* 12.8*  HCT 38.0* 39.2 41.9 38.2* 40.3  MCV 85.0 86.2 85.5 87.0 86.3  PLT 136* 130* 145* 142* 378*   Basic Metabolic Panel: Recent Labs  Lab 08/25/20 0527 08/26/20 0531 08/27/20 0409 08/28/20 0606 08/29/20 0421  NA 142 142 143 139 139  K 3.3* 3.3* 3.7 3.6 3.8  CL 109 107 106 101 99  CO2 28 29 31 31  32  GLUCOSE 145* 81 105* 106* 109*  BUN 13 12 13 16 14   CREATININE 0.45* 0.39* 0.50* 0.32* <0.30*  CALCIUM 7.8* 8.0* 8.0* 8.0* 8.1*  MG 2.0 1.8 1.7 1.7 1.6*   GFR: CrCl cannot be calculated (This lab value cannot be used to calculate CrCl because it is not a number: <0.30). Liver Function Tests: No results for input(s): AST, ALT, ALKPHOS, BILITOT, PROT, ALBUMIN in the last 168 hours. No results for input(s): LIPASE, AMYLASE in the last 168 hours. No results for input(s): AMMONIA in the last 168 hours. Coagulation Profile: No results for input(s): INR, PROTIME in the last 168 hours. Cardiac Enzymes: No results for input(s): CKTOTAL, CKMB,  CKMBINDEX, TROPONINI in the last 168 hours. BNP (last 3 results) No results for input(s): PROBNP in the last 8760 hours. HbA1C: No results for input(s): HGBA1C in the last 72 hours. CBG: No results for input(s): GLUCAP in the last 168 hours. Lipid Profile: No results for input(s): CHOL, HDL, LDLCALC, TRIG, CHOLHDL, LDLDIRECT in the last 72 hours. Thyroid Function Tests: No results for input(s): TSH, T4TOTAL, FREET4, T3FREE, THYROIDAB in the last 72 hours. Anemia Panel: No results for input(s): VITAMINB12, FOLATE, FERRITIN, TIBC, IRON, RETICCTPCT in the last 72 hours. Sepsis Labs: No results for input(s): PROCALCITON, LATICACIDVEN in the last 168 hours.  Recent Results (from the past 240 hour(s))  Resp Panel by RT-PCR (Flu A&B, Covid) Nasopharyngeal Swab     Status: None   Collection Time: 08/22/20 12:45 PM   Specimen: Nasopharyngeal Swab; Nasopharyngeal(NP) swabs in vial transport medium  Result Value Ref Range Status   SARS Coronavirus 2 by RT PCR NEGATIVE NEGATIVE Final    Comment: (NOTE) SARS-CoV-2 target nucleic acids  are NOT DETECTED.  The SARS-CoV-2 RNA is generally detectable in upper respiratory specimens during the acute phase of infection. The lowest concentration of SARS-CoV-2 viral copies this assay can detect is 138 copies/mL. A negative result does not preclude SARS-Cov-2 infection and should not be used as the sole basis for treatment or other patient management decisions. A negative result may occur with  improper specimen collection/handling, submission of specimen other than nasopharyngeal swab, presence of viral mutation(s) within the areas targeted by this assay, and inadequate number of viral copies(<138 copies/mL). A negative result must be combined with clinical observations, patient history, and epidemiological information. The expected result is Negative.  Fact Sheet for Patients:  EntrepreneurPulse.com.au  Fact Sheet for Healthcare  Providers:  IncredibleEmployment.be  This test is no t yet approved or cleared by the [place] FDA and  has been authorized for detection and/or diagnosis of SARS-CoV-2 by FDA under an Emergency Use Authorization (EUA). This EUA will remain  in effect (meaning this test can be used) for the duration of the COVID-19 declaration under Section 564(b)(1) of the Act, 21 U.S.C.section 360bbb-3(b)(1), unless the authorization is terminated  or revoked sooner.       Influenza A by PCR NEGATIVE NEGATIVE Final   Influenza B by PCR NEGATIVE NEGATIVE Final    Comment: (NOTE) The Xpert Xpress SARS-CoV-2/FLU/RSV plus assay is intended as an aid in the diagnosis of influenza from Nasopharyngeal swab specimens and should not be used as a sole basis for treatment. Nasal washings and aspirates are unacceptable for Xpert Xpress SARS-CoV-2/FLU/RSV testing.  Fact Sheet for Patients: EntrepreneurPulse.com.au  Fact Sheet for Healthcare Providers: IncredibleEmployment.be  This test is not yet approved or cleared by the [place] FDA and has been authorized for detection and/or diagnosis of SARS-CoV-2 by FDA under an Emergency Use Authorization (EUA). This EUA will remain in effect (meaning this test can be used) for the duration of the COVID-19 declaration under Section 564(b)(1) of the Act, 21 U.S.C. section 360bbb-3(b)(1), unless the authorization is terminated or revoked.  Performed at [facility], [address] [place], [place] [address]   Blood culture (routine x 2)     Status: None   Collection Time: 08/22/20  3:18 PM   Specimen: BLOOD  Result Value Ref Range Status   Specimen Description BLOOD RIGHT ANTECUBITAL  Final   Special Requests   Final    BOTTLES DRAWN AEROBIC AND ANAEROBIC Blood Culture adequate volume   Culture   Final    NO GROWTH 5 DAYS Performed at [facility], [address],  [place], [place] [address]    Report Status 08/27/2020 FINAL  Final  Blood culture (routine x 2)     Status: None   Collection Time: 08/22/20  3:18 PM   Specimen: BLOOD  Result Value Ref Range Status   Specimen Description BLOOD LEFT ANTECUBITAL  Final   Special Requests   Final    BOTTLES DRAWN AEROBIC AND ANAEROBIC Blood Culture adequate volume   Culture   Final    NO GROWTH 5 DAYS Performed at [facility], [address] [place],  [address]    Report Status 08/27/2020 FINAL  Final  Expectorated Sputum Assessment w Gram Stain, Rflx to Resp Cult     Status: None   Collection Time: 08/23/20  4:15 AM   Specimen: Expectorated Sputum  Result Value Ref Range Status   Specimen Description EXPECTORATED SPUTUM  Final   Special Requests NONE  Final  Sputum evaluation   Final    THIS SPECIMEN IS ACCEPTABLE FOR SPUTUM CULTURE Performed at [facility], [address] [place], [place] [address]    Report Status 08/24/2020 FINAL  Final  Culture, Respiratory w Gram Stain     Status: None   Collection Time: 08/23/20  4:15 AM  Result Value Ref Range Status   Specimen Description   Final    EXPECTORATED SPUTUM Performed at [facility], [address] [place], [place] [address]    Special Requests   Final    NONE Reflexed from [id number] Performed at [address] [place], [place] [address]    Gram Stain   Final    MODERATE WBC PRESENT,BOTH PMN AND MONONUCLEAR MODERATE GRAM POSITIVE COCCI IN PAIRS IN CLUSTERS Performed at [facility], [address] [place], [place] [address]    Culture   Final    MODERATE METHICILLIN RESISTANT STAPHYLOCOCCUS AUREUS   Report Status 08/26/2020 FINAL  Final   Organism ID, Bacteria METHICILLIN RESISTANT STAPHYLOCOCCUS AUREUS  Final      Susceptibility   Methicillin resistant staphylococcus aureus - MIC*    CIPROFLOXACIN <=0.5 SENSITIVE Sensitive     ERYTHROMYCIN >=8 RESISTANT Resistant      GENTAMICIN <=0.5 SENSITIVE Sensitive     OXACILLIN >=4 RESISTANT Resistant     TETRACYCLINE <=1 SENSITIVE Sensitive     VANCOMYCIN <=0.5 SENSITIVE Sensitive     TRIMETH/SULFA <=10 SENSITIVE Sensitive     CLINDAMYCIN <=0.25 SENSITIVE Sensitive     RIFAMPIN <=0.5 SENSITIVE Sensitive     Inducible Clindamycin NEGATIVE Sensitive     * MODERATE METHICILLIN RESISTANT STAPHYLOCOCCUS AUREUS  MRSA PCR Screening     Status: Abnormal   Collection Time: 08/24/20  3:15 AM   Specimen: Nasopharyngeal  Result Value Ref Range Status   MRSA by PCR POSITIVE (A) NEGATIVE Final    Comment:        The GeneXpert MRSA Assay (FDA approved for NASAL specimens only), is one component of a comprehensive MRSA colonization surveillance program. It is not intended to diagnose MRSA infection nor to guide or monitor treatment for MRSA infections. RESULT CALLED TO, READ BACK BY AND VERIFIED WITH: MIA YOUNG AT 0530 ON 08/24/2020 [place]. Performed at [facility], [place], [place] [address]   Acid Fast Smear (AFB)     Status: None   Collection Time: 08/24/20  4:15 AM   Specimen: Sputum  Result Value Ref Range Status   AFB Specimen Processing Concentration  Final   Acid Fast Smear Negative  Final    Comment: (NOTE) Performed At: [facility] [place], [place] [id number] Rush Farmer MD VO:[id number]    Source (AFB) EXPECTORATED SPUTUM  Final    Comment: Performed at [facility], [address] [place],  [address]  Resp Panel by RT-PCR (Flu A&B, Covid) Nasopharyngeal Swab     Status: None   Collection Time: 08/28/20  3:50 PM   Specimen: Nasopharyngeal Swab; Nasopharyngeal(NP) swabs in vial transport medium  Result Value Ref Range Status   SARS Coronavirus 2 by RT PCR NEGATIVE NEGATIVE Final    Comment: (NOTE) SARS-CoV-2 target nucleic acids are NOT DETECTED.  The SARS-CoV-2 RNA is generally detectable in upper respiratory specimens during  the acute phase of infection. The lowest concentration of SARS-CoV-2 viral copies this assay can detect is 138 copies/mL. A negative result does not preclude SARS-Cov-2 infection and should not  be used as the sole basis for treatment or other patient management decisions. A negative result may occur with  improper specimen collection/handling, submission of specimen other than nasopharyngeal swab, presence of viral mutation(s) within the areas targeted by this assay, and inadequate number of viral copies(<138 copies/mL). A negative result must be combined with clinical observations, patient history, and epidemiological information. The expected result is Negative.  Fact Sheet for Patients:  EntrepreneurPulse.com.au  Fact Sheet for Healthcare Providers:  IncredibleEmployment.be  This test is no t yet approved or cleared by the [place] FDA and  has been authorized for detection and/or diagnosis of SARS-CoV-2 by FDA under an Emergency Use Authorization (EUA). This EUA will remain  in effect (meaning this test can be used) for the duration of the COVID-19 declaration under Section 564(b)(1) of the Act, 21 U.S.C.section 360bbb-3(b)(1), unless the authorization is terminated  or revoked sooner.       Influenza A by PCR NEGATIVE NEGATIVE Final   Influenza B by PCR NEGATIVE NEGATIVE Final    Comment: (NOTE) The Xpert Xpress SARS-CoV-2/FLU/RSV plus assay is intended as an aid in the diagnosis of influenza from Nasopharyngeal swab specimens and should not be used as a sole basis for treatment. Nasal washings and aspirates are unacceptable for Xpert Xpress SARS-CoV-2/FLU/RSV testing.  Fact Sheet for Patients: EntrepreneurPulse.com.au  Fact Sheet for Healthcare Providers: IncredibleEmployment.be  This test is not yet approved or cleared by the [place] FDA and has been authorized for detection and/or  diagnosis of SARS-CoV-2 by FDA under an Emergency Use Authorization (EUA). This EUA will remain in effect (meaning this test can be used) for the duration of the COVID-19 declaration under Section 564(b)(1) of the Act, 21 U.S.C. section 360bbb-3(b)(1), unless the authorization is terminated or revoked.  Performed at [facility], [address] [place], [place] [address]          Radiology Studies: [facility] Chest [place] 1 View  Result Date: 08/28/2020 CLINICAL DATA:  Hypoxia. EXAM: PORTABLE CHEST 1 VIEW COMPARISON:  CT 08/22/2020.  Chest x-ray 08/22/2020. FINDINGS: Mediastinum hilar structures normal. Heart size normal. Cavitary process in the right mid lung and left upper lung best identified by prior CT. No new findings are noted. No pleural effusion or pneumothorax. Heart size stable. Degenerative change thoracic spine. Old right rib fractures present. IMPRESSION: Cavitary process in the right mid lung and left upper lung best identified by prior CT. Chest is unchanged from prior exam. Electronically Signed   By: Marcello Moores  Register   On: 08/28/2020 08:25   ECHOCARDIOGRAM COMPLETE  Result Date: 08/28/2020    ECHOCARDIOGRAM REPORT   Patient Name:   Marcus [name] [facility] Date of Exam: 08/28/2020 Medical Rec #:  [id number]    Height:       66.0 in Accession #:    [id number]   Weight:       133.6 lb Date of Birth:  [birth year]     BSA:          1.685 m Patient Age:    74 years     BP:           120/74 mmHg Patient Gender: M            HR:           76 bpm. Exam Location:  [facility] Procedure: 2D Echo, Cardiac Doppler and Color Doppler Indications:     Endocarditis I38  History:         Patient has  prior history of Echocardiogram examinations, most                  recent 06/11/2020. COPD. Tobacco use disorder.  Sonographer:     Sherrie Sport RDCS (AE) Referring Phys:  [id number] Sharen Hones Diagnosing Phys: Nelva Bush MD  Sonographer Comments: Technically challenging study due to limited acoustic windows. Image  acquisition challenging due to COPD. IMPRESSIONS  1. Left ventricular ejection fraction, by estimation, is 55 to 60%. The left ventricle has normal function. Left ventricular endocardial border not optimally defined to evaluate regional wall motion. Left ventricular diastolic parameters are consistent with Grade I diastolic dysfunction (impaired relaxation).  2. Right ventricular systolic function is normal. The right ventricular size is mildly enlarged. Mildly increased right ventricular wall thickness. There is normal pulmonary artery systolic pressure.  3. The mitral valve is degenerative. Trivial mitral valve regurgitation. No evidence of mitral stenosis.  4. The aortic valve was not well visualized. Aortic valve regurgitation is not visualized. No aortic stenosis is present.  5. Pulmonic valve regurgitation not well assessed.  6. The inferior vena cava is normal in size with greater than 50% respiratory variability, suggesting right atrial pressure of 3 mmHg. Conclusion(s)/Recommendation(s): Valves suboptimally imaged to exclude endocarditis. If clinical concern persists, transesophageal echocardiogram should be considered. FINDINGS  Left Ventricle: Left ventricular ejection fraction, by estimation, is 55 to 60%. The left ventricle has normal function. Left ventricular endocardial border not optimally defined to evaluate regional wall motion. The left ventricular internal cavity size was normal in size. There is no left ventricular hypertrophy. Left ventricular diastolic parameters are consistent with Grade I diastolic dysfunction (impaired relaxation). Right Ventricle: The right ventricular size is mildly enlarged. Mildly increased right ventricular wall thickness. Right ventricular systolic function is normal. There is normal pulmonary artery systolic pressure. The tricuspid regurgitant velocity is 1.72 m/s, and with an assumed right atrial pressure of 3 mmHg, the estimated right ventricular systolic pressure  is 37.1 mmHg. Left Atrium: Left atrial size was normal in size. Right Atrium: Right atrial size was normal in size. Pericardium: There is no evidence of pericardial effusion. Mitral Valve: The mitral valve is degenerative in appearance. There is mild thickening of the mitral valve leaflet(s). There is mild calcification of the mitral valve leaflet(s). Mild mitral annular calcification. Trivial mitral valve regurgitation. No evidence of mitral valve stenosis. Tricuspid Valve: The tricuspid valve is grossly normal. Tricuspid valve regurgitation is trivial. Aortic Valve: The aortic valve was not well visualized. Aortic valve regurgitation is not visualized. No aortic stenosis is present. Aortic valve mean gradient measures 3.0 mmHg. Aortic valve peak gradient measures 4.8 mmHg. Aortic valve area, by VTI measures 2.04 cm. Pulmonic Valve: The pulmonic valve was not well visualized. Pulmonic valve regurgitation not well assessed. Aorta: The aortic root is normal in size and structure. Pulmonary Artery: The pulmonary artery is not well seen. Venous: The inferior vena cava is normal in size with greater than 50% respiratory variability, suggesting right atrial pressure of 3 mmHg. IAS/Shunts: The interatrial septum was not well visualized.  LEFT VENTRICLE PLAX 2D LVIDd:         5.38 cm  Diastology LVIDs:         2.94 cm  LV e' medial:    6.09 cm/s LV PW:         1.02 cm  LV E/e' medial:  10.3 LV IVS:        0.73 cm  LV e' lateral:   11.40 cm/s  LVOT diam:     2.10 cm  LV E/e' lateral: 5.5 LV SV:         45 LV SV Index:   27 LVOT Area:     3.46 cm  RIGHT VENTRICLE RV Basal diam:  4.23 cm RV S prime:     9.46 cm/s TAPSE (M-mode): 2.0 cm LEFT ATRIUM             Index       RIGHT ATRIUM           Index LA diam:        3.50 cm 2.08 cm/m  RA Area:     16.50 cm LA Vol (A2C):   30.0 ml 17.81 ml/m RA Volume:   44.10 ml  26.18 ml/m LA Vol (A4C):   39.3 ml 23.33 ml/m LA Biplane Vol: 35.6 ml 21.13 ml/m  AORTIC VALVE                    PULMONIC VALVE AV Area (Vmax):    1.89 cm    PV Vmax:        0.88 m/s AV Area (Vmean):   2.10 cm    PV Peak grad:   3.1 mmHg AV Area (VTI):     2.04 cm    RVOT Peak grad: 3 mmHg AV Vmax:           110.00 cm/s AV Vmean:          74.000 cm/s AV VTI:            0.219 m AV Peak Grad:      4.8 mmHg AV Mean Grad:      3.0 mmHg LVOT Vmax:         60.10 cm/s LVOT Vmean:        44.800 cm/s LVOT VTI:          0.129 m LVOT/AV VTI ratio: 0.59  AORTA Ao Root diam: 3.60 cm MITRAL VALVE               TRICUSPID VALVE MV Area (PHT): 4.86 cm    TR Peak grad:   11.8 mmHg MV Decel Time: 156 msec    TR Vmax:        172.00 cm/s MV E velocity: 62.60 cm/s MV A velocity: 86.50 cm/s  SHUNTS MV E/A ratio:  0.72        Systemic VTI:  0.13 m                            Systemic Diam: 2.10 cm Nelva Bush MD Electronically signed by Nelva Bush MD Signature Date/Time: 08/28/2020/9:56:00 AM    Final         Scheduled Meds:  doxycycline  100 mg Oral Q12H   feeding supplement  237 mL Oral BID BM   mometasone-formoterol  2 puff Inhalation BID   nicotine  21 mg Transdermal Daily   pantoprazole  40 mg Oral Daily   predniSONE  10 mg Oral BID WC   Tbo-filgastrim (GRANIX) SQ  480 mcg Subcutaneous Daily   Continuous Infusions:    LOS: 7 days    Time spent: 30 minutes    Kathie Dike, MD [facility]   To contact the attending provider between 7A-7P or the covering provider during after hours 7P-7A, please log into the web site [email or web address] and access using universal [place] password for that web site. If you  do not have the password, please call the hospital operator.  08/29/2020, 2:40 PM

## 2020-08-30 ENCOUNTER — Other Ambulatory Visit: Payer: Self-pay | Admitting: Internal Medicine

## 2020-08-30 ENCOUNTER — Telehealth: Payer: Self-pay | Admitting: Internal Medicine

## 2020-08-30 DIAGNOSIS — I5032 Chronic diastolic (congestive) heart failure: Secondary | ICD-10-CM

## 2020-08-30 DIAGNOSIS — J189 Pneumonia, unspecified organism: Secondary | ICD-10-CM | POA: Diagnosis not present

## 2020-08-30 DIAGNOSIS — D709 Neutropenia, unspecified: Secondary | ICD-10-CM | POA: Diagnosis not present

## 2020-08-30 DIAGNOSIS — D61818 Other pancytopenia: Secondary | ICD-10-CM

## 2020-08-30 DIAGNOSIS — J9621 Acute and chronic respiratory failure with hypoxia: Secondary | ICD-10-CM | POA: Diagnosis not present

## 2020-08-30 DIAGNOSIS — M059 Rheumatoid arthritis with rheumatoid factor, unspecified: Secondary | ICD-10-CM

## 2020-08-30 MED ORDER — ACETAMINOPHEN 325 MG PO TABS
650.0000 mg | ORAL_TABLET | ORAL | Status: DC | PRN
  Administered 2020-08-30: 650 mg via ORAL
  Filled 2020-08-30: qty 2

## 2020-08-30 MED ORDER — ALBUTEROL SULFATE HFA 108 (90 BASE) MCG/ACT IN AERS: 2.0000 | INHALATION_SPRAY | Freq: Four times a day (QID) | RESPIRATORY_TRACT | 2 refills | Status: DC | PRN

## 2020-08-30 MED ORDER — PREDNISONE 10 MG PO TABS: 10.0000 mg | ORAL_TABLET | Freq: Two times a day (BID) | ORAL | 1 refills | Status: DC

## 2020-08-30 MED ORDER — MOMETASONE FURO-FORMOTEROL FUM 100-5 MCG/ACT IN AERO: 2.0000 | INHALATION_SPRAY | Freq: Two times a day (BID) | RESPIRATORY_TRACT | 0 refills | Status: DC

## 2020-08-30 MED ORDER — ALBUTEROL SULFATE (2.5 MG/3ML) 0.083% IN NEBU: 2.5000 mg | INHALATION_SOLUTION | Freq: Four times a day (QID) | RESPIRATORY_TRACT | 1 refills | Status: DC | PRN

## 2020-08-30 MED ORDER — ALUM & MAG HYDROXIDE-SIMETH 200-200-20 MG/5ML PO SUSP
30.0000 mL | ORAL | Status: DC | PRN
  Administered 2020-08-30: 30 mL via ORAL
  Filled 2020-08-30: qty 30

## 2020-08-30 MED ORDER — DOXYCYCLINE HYCLATE 100 MG PO TABS: 100.0000 mg | ORAL_TABLET | Freq: Two times a day (BID) | ORAL | 0 refills | Status: DC

## 2020-08-30 NOTE — Discharge Summary (Signed)
Physician Discharge Summary  Marcus [name]:[id number] DOB: [birth year] DOA: 08/22/2020  PCP: [name], FNP  Admit date: 08/22/2020 Discharge date: 08/30/2020  Admitted From: home Disposition:  home  Recommendations for Outpatient Follow-up:  1. Follow up with PCP in 1-2 weeks 2. Please obtain BMP/CBC in one week 3. Follow up with hem/onc as previously scheduled for further granix inj 4. Follow up with infectious disease in 4 weeks 5. Follow up with pulmonology for 4 weeks  Home Health: [facility], OT, aide Equipment/Devices:walker, bedside commode  Discharge Condition:stable CODE STATUS:full code Diet recommendation: dysphagia 3 diet  Brief/Interim Summary: Marcus [name] is a 60-year-male with history of COPD, chronic hypoxemic respiratoryfailure on 2 L oxygen, rheumatoid arthritis, pancytopenia, Felty syndrome who came to the hospital with complaints of short of breath and a cough for at least a month. Upon arriving the emergency room, his CT scan showed cavitary lesion in the right upper lobe. Suspect lung abscess, he is seen by pulmonology, placed on Zosyn, also added vancomycin. 5/23.Sputum culture came back with MRSA, continue Vanco, discontinue Zosyn. Obtain echocardiogram. Blood culture negative. ID recommended 6 weeks of doxycycline orally.  Discharge Diagnoses:  Active Problems:   Moderate protein-calorie malnutrition (HCC)   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HCC)   Chronic obstructive pulmonary disease (HCC)   Hypokalemia   Chronic diastolic CHF (congestive heart failure) (HCC)   Hyponatremia   Cavitary pneumonia   Acute on chronic respiratory failure with hypoxia (HCC)   Hypothermia   Acquired neutropenia (HCC)  #1. Left upper lobe MRSA pneumonia with abscess. Patient blood culture was negative, sputum culture positive for MRSA. Initially treated with vancomycin. Subsequently transitioned to prolonged course of doxycycline (4-6 weeks) per recommendation by ID. Echocardiogram  showed ejection fraction 55 to 60%, no gross valvular abnormality. He will follow up with ID in 4 weeks and will need repeat chest imaging at that time.   2. Rheumatoid arthritis. Felty syndrome. Pancytopenia. Worsening neutropenia. Hypothermia. Relative adrenal insufficiency. Patient initially had a hypothermia, was caused by relative adrenal insufficiency.  Prednisone dose was doubled, after that condition has resolved. Patient also developed a worsening neutropenia, he has a chronic neutropenia.  He was seen by oncology, thought to be secondary to infection. Granix was given, with improvement of WBC count He will follow up with oncology as outpatient to be considered for further granix based on his WBC counts  #3.  Failure to thrive. Likely dementia. Initial recommendations were for SNF placement due to severe deconditioning Overall functional status has improved since admission and he is now ambulating independently Seen by PT with recs updated to Home health PT  #4.  Acute on chronic hypoxemic respiratory failure. COPD. No COPD exacerbation, oxygenation gradually improving. Had oxygen saturations as low as 88% on admission He did require up to 4L of oxygen Now back to baseline requirement of 2L via [place] Seen by pulmonology with recommendations to continue on current medication regimen He is prednisone dependent, and baseline prednisone dose increased from 10mg  daily to 10mg  bid Follow up with pulmonology in 4 weeks  5.  Hypokalemia. Improved.  6. Moderate malnutrition. Continue supportive measures  Discharge Instructions  Discharge Instructions    Ambulatory referral to Infectious Disease   Complete by: As directed    Patient will need follow up with Dr. Delaine Lame in 3-4 weeks for MRSA lung infection   Diet - low sodium heart healthy   Complete by: As directed    Increase activity slowly   Complete by:  As directed      Allergies as of 08/30/2020       Reactions   Levaquin [levofloxacin In D5w] Other (See Comments)   Chest pain   Methotrexate Other (See Comments)   Chest pain      Medication List    TAKE these medications   albuterol (2.5 MG/3ML) 0.083% nebulizer solution Commonly known as: PROVENTIL Take 3 mLs (2.5 mg total) by nebulization every 6 (six) hours as needed for wheezing or shortness of breath.   albuterol 108 (90 Base) MCG/ACT inhaler Commonly known as: VENTOLIN HFA Inhale 2 puffs into the lungs every 6 (six) hours as needed for wheezing or shortness of breath.   doxycycline 100 MG tablet Commonly known as: VIBRA-TABS Take 1 tablet (100 mg total) by mouth every 12 (twelve) hours.   feeding supplement Liqd Take 237 mLs by mouth 2 (two) times daily between meals.   guaiFENesin-dextromethorphan 100-10 MG/5ML syrup Commonly known as: ROBITUSSIN DM Take 5 mLs by mouth every 4 (four) hours as needed for cough.   mometasone-formoterol 100-5 MCG/ACT Aero Commonly known as: DULERA Inhale 2 puffs into the lungs 2 (two) times daily.   pantoprazole 40 MG tablet Commonly known as: PROTONIX Take 1 tablet (40 mg total) by mouth daily for 10 days.   predniSONE 10 MG tablet Commonly known as: DELTASONE Take 1 tablet (10 mg total) by mouth 2 (two) times daily with a meal. What changed: when to take this            Durable Medical Equipment  (From admission, onward)         Start     Ordered   08/30/20 1149  For home use only DME Bedside commode  Once       Question:  Patient needs a bedside commode to treat with the following condition  Answer:  Generalized weakness   08/30/20 1148   08/30/20 1148  For home use only DME Walker rolling  Once       Question Answer Comment  Walker: With [place]   Patient needs a walker to treat with the following condition Generalized weakness      08/30/20 1148          Contact information for follow-up providers    Cammie Sickle, MD Follow up.    Specialties: Internal Medicine, Oncology Why: follow up as previously scheduled Contact information: [address] [place] [address] [phone]        Tsosie Billing, MD. Schedule an appointment as soon as possible for a visit in 4 week(s).   Specialty: Infectious Diseases Why: office will call you with appointment Contact information: [place] [address] [phone]        Erby Pian, MD. Schedule an appointment as soon as possible for a visit in 4 week(s).   Specialty: Specialist Contact information: [place] [address] [phone]        [name], FNP. Schedule an appointment as soon as possible for a visit in 2 week(s).   Specialty: Family Medicine Contact information: [place] [address] [phone]            Contact information for after-discharge care    Destination    [facility] .   Service: Skilled Nursing Contact information: [place] [phone]  Allergies  Allergen Reactions  . Levaquin [Levofloxacin In D5w] Other (See Comments)    Chest pain  . Methotrexate Other (See Comments)    Chest pain    Consultations:  Infectious disease  Pulmonology  Hem/onc   Procedures/Studies: CT Chest Wo Contrast  Result Date: 08/22/2020 CLINICAL DATA:  Abnormal x-ray. Interstitial infiltrates with possible right perihilar cavitation. Cough and shortness of breath. EXAM: CT CHEST WITHOUT CONTRAST TECHNIQUE: Multidetector CT imaging of the chest was performed following the standard protocol without IV contrast. COMPARISON:  Radiographs 08/22/2020 and 08/04/2020.  CT 06/10/2020. FINDINGS: Cardiovascular: Extensive three-vessel coronary artery atherosclerosis with lesser involvement of the aorta and great vessels. The heart size is normal. There is no pericardial effusion.  Mediastinum/Nodes: Multiple small mediastinal and hilar lymph nodes have enlarged compared with the chest CT of 2 months ago, likely reactive. No axillary lymphadenopathy. The thyroid gland, trachea and esophagus demonstrate no significant findings. Lungs/Pleura: There is no pleural effusion or pneumothorax. Moderate centrilobular and paraseptal emphysema. As seen on the earlier radiographs, there is a new cavitary mass inferomedially in the right upper lobe, measuring up to 7.8 x 5.5 cm on image 79/4. This has irregular thick walls and associated air-fluid levels. There was no abnormality in this area on the relatively recent prior chest CT, and this is most consistent with cavitating pneumonia. There is an additional cavitary lesion with an air-fluid level medially in the left upper lobe, measuring up to 2.4 cm on image 65/4. There is mild patchy airspace disease surrounding these cavitary infiltrates. Upper abdomen: Small calcified gallstones. No evidence of biliary dilatation or adrenal mass. Postsurgical changes in the anterior abdominal wall consistent with previous hernia repair. Musculoskeletal/Chest wall: There is no chest wall mass or suspicious osseous finding. There are old fractures of the right 5th and 6th ribs posterolaterally which appear unchanged. IMPRESSION: 1. CT confirms the presence of a cavitary infiltrate in the right upper lobe and shows a smaller area of cavitation anteriorly in the left upper lobe. No abnormalities were present in these areas on CT performed 2 months ago to suggest cavitary neoplasm, and these findings are consistent with cavitating/necrotizing pneumonia. 2. Mildly enlarged mediastinal and hilar lymph nodes, likely reactive. 3. No associated pleural disease. 4. Cholelithiasis. 5. Underlying moderate Emphysema (ICD10-J43.9). 6. Coronary and Aortic Atherosclerosis (ICD10-I70.0). Electronically Signed   By: Richardean Sale M.D.   On: 08/22/2020 14:26   DG Chest Port 1  View  Result Date: 08/28/2020 CLINICAL DATA:  Hypoxia. EXAM: PORTABLE CHEST 1 VIEW COMPARISON:  CT 08/22/2020.  Chest x-ray 08/22/2020. FINDINGS: Mediastinum hilar structures normal. Heart size normal. Cavitary process in the right mid lung and left upper lung best identified by prior CT. No new findings are noted. No pleural effusion or pneumothorax. Heart size stable. Degenerative change thoracic spine. Old right rib fractures present. IMPRESSION: Cavitary process in the right mid lung and left upper lung best identified by prior CT. Chest is unchanged from prior exam. Electronically Signed   By: Marcello Moores  Register   On: 08/28/2020 08:25   DG Chest Portable 1 View  Result Date: 08/22/2020 CLINICAL DATA:  Shortness of breath EXAM: PORTABLE CHEST 1 VIEW COMPARISON:  August 04, 2020 FINDINGS: There is apparent scarring in the right perihilar region. There is suggestion of cavitation in this area in the right perihilar region. Slight scarring lateral right base. The lungs elsewhere are clear. Heart size and pulmonary vascularity are normal. No appreciable adenopathy. No bone lesions. IMPRESSION: Scarring right mid  lung. Suggestion of developing cavitation in this area which could be due to overlapping of areas of scarring and normal structures. Given concern for cavitation in this area, noncontrast chest CT advised to further evaluate. Slight scarring lateral right base. Lungs elsewhere are clear. Heart size normal. No adenopathy evident by radiography. Electronically Signed   By: Lowella Grip III M.D.   On: 08/22/2020 13:24   Portable chest 1 View  Result Date: 08/04/2020 CLINICAL DATA:  Pneumonia. EXAM: PORTABLE CHEST 1 VIEW COMPARISON:  08/03/2020 and 06/29/2020 FINDINGS: Chronic coarse lung markings. No new airspace disease. Heart size is stable. Trachea is midline. Old right rib fractures. Negative for a pneumothorax. IMPRESSION: Chronic lung changes without acute findings. Electronically Signed   By:  Markus Daft M.D.   On: 08/04/2020 12:24   DG Chest Port 1 View  Result Date: 08/03/2020 CLINICAL DATA:  Shortness of breath and low-grade fever with possible sepsis. EXAM: PORTABLE CHEST 1 VIEW COMPARISON:  June 29, 2020 FINDINGS: The lungs are hyperinflated. Diffuse, chronic appearing increased interstitial lung markings are noted. There is no evidence of acute infiltrate, pleural effusion or pneumothorax. The heart size and mediastinal contours are within normal limits. Chronic sixth and seventh right rib fractures are seen. IMPRESSION: COPD without acute or active cardiopulmonary disease. Electronically Signed   By: Virgina Norfolk M.D.   On: 08/03/2020 19:24   ECHOCARDIOGRAM COMPLETE  Result Date: 08/28/2020    ECHOCARDIOGRAM REPORT   Patient Name:   Marcus [name] [facility] Date of Exam: 08/28/2020 Medical Rec #:  [id number]    Height:       66.0 in Accession #:    [id number]   Weight:       133.6 lb Date of Birth:  1961/01/20     BSA:          1.685 m Patient Age:    54 years     BP:           120/74 mmHg Patient Gender: M            HR:           76 bpm. 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 COPD. Tobacco use disorder.  Sonographer:     Sherrie Sport RDCS (AE) Referring Phys:  [id number] Sharen Hones Diagnosing Phys: Nelva Bush MD  Sonographer Comments: Technically challenging study due to limited acoustic windows. Image acquisition challenging due to COPD. IMPRESSIONS  1. Left ventricular ejection fraction, by estimation, is 55 to 60%. The left ventricle has normal function. Left ventricular endocardial border not optimally defined to evaluate regional wall motion. Left ventricular diastolic parameters are consistent with Grade I diastolic dysfunction (impaired relaxation).  2. Right ventricular systolic function is normal. The  right ventricular size is mildly enlarged. Mildly increased right ventricular wall thickness. There is normal pulmonary artery systolic pressure.  3. The mitral valve is degenerative. Trivial mitral valve regurgitation. No evidence of mitral stenosis.  4. The aortic valve was not well visualized. Aortic valve regurgitation is not visualized. No aortic stenosis is present.  5. Pulmonic valve regurgitation not well assessed.  6. The inferior vena cava is normal in size with greater than 50% respiratory variability, suggesting  right atrial pressure of 3 mmHg. Conclusion(s)/Recommendation(s): Valves suboptimally imaged to exclude endocarditis. If clinical concern persists, transesophageal echocardiogram should be considered. FINDINGS  Left Ventricle: Left ventricular ejection fraction, by estimation, is 55 to 60%. The left ventricle has normal function. Left ventricular endocardial border not optimally defined to evaluate regional wall motion. The left ventricular internal cavity size was normal in size. There is no left ventricular hypertrophy. Left ventricular diastolic parameters are consistent with Grade I diastolic dysfunction (impaired relaxation). Right Ventricle: The right ventricular size is mildly enlarged. Mildly increased right ventricular wall thickness. Right ventricular systolic function is normal. There is normal pulmonary artery systolic pressure. The tricuspid regurgitant velocity is 1.72 m/s, and with an assumed right atrial pressure of 3 mmHg, the estimated right ventricular systolic pressure is 16.6 mmHg. Left Atrium: Left atrial size was normal in size. Right Atrium: Right atrial size was normal in size. Pericardium: There is no evidence of pericardial effusion. Mitral Valve: The mitral valve is degenerative in appearance. There is mild thickening of the mitral valve leaflet(s). There is mild calcification of the mitral valve leaflet(s). Mild mitral annular calcification. Trivial mitral valve  regurgitation. No evidence of mitral valve stenosis. Tricuspid Valve: The tricuspid valve is grossly normal. Tricuspid valve regurgitation is trivial. Aortic Valve: The aortic valve was not well visualized. Aortic valve regurgitation is not visualized. No aortic stenosis is present. Aortic valve mean gradient measures 3.0 mmHg. Aortic valve peak gradient measures 4.8 mmHg. Aortic valve area, by VTI measures 2.04 cm. Pulmonic Valve: The pulmonic valve was not well visualized. Pulmonic valve regurgitation not well assessed. Aorta: The aortic root is normal in size and structure. Pulmonary Artery: The pulmonary artery is not well seen. Venous: The inferior vena cava is normal in size with greater than 50% respiratory variability, suggesting right atrial pressure of 3 mmHg. IAS/Shunts: The interatrial septum was not well visualized.  LEFT VENTRICLE PLAX 2D LVIDd:         5.38 cm  Diastology LVIDs:         2.94 cm  LV e' medial:    6.09 cm/s LV PW:         1.02 cm  LV E/e' medial:  10.3 LV IVS:        0.73 cm  LV e' lateral:   11.40 cm/s LVOT diam:     2.10 cm  LV E/e' lateral: 5.5 LV SV:         45 LV SV Index:   27 LVOT Area:     3.46 cm  RIGHT VENTRICLE RV Basal diam:  4.23 cm RV S prime:     9.46 cm/s TAPSE (M-mode): 2.0 cm LEFT ATRIUM             Index       RIGHT ATRIUM           Index LA diam:        3.50 cm 2.08 cm/m  RA Area:     16.50 cm LA Vol (A2C):   30.0 ml 17.81 ml/m RA Volume:   44.10 ml  26.18 ml/m LA Vol (A4C):   39.3 ml 23.33 ml/m LA Biplane Vol: 35.6 ml 21.13 ml/m  AORTIC VALVE                   PULMONIC VALVE AV Area (Vmax):    1.89 cm    PV Vmax:        0.88 m/s AV Area (Vmean):   2.10  cm    PV Peak grad:   3.1 mmHg AV Area (VTI):     2.04 cm    RVOT Peak grad: 3 mmHg AV Vmax:           110.00 cm/s AV Vmean:          74.000 cm/s AV VTI:            0.219 m AV Peak Grad:      4.8 mmHg AV Mean Grad:      3.0 mmHg LVOT Vmax:         60.10 cm/s LVOT Vmean:        44.800 cm/s LVOT VTI:           0.129 m LVOT/AV VTI ratio: 0.59  AORTA Ao Root diam: 3.60 cm MITRAL VALVE               TRICUSPID VALVE MV Area (PHT): 4.86 cm    TR Peak grad:   11.8 mmHg MV Decel Time: 156 msec    TR Vmax:        172.00 cm/s MV E velocity: 62.60 cm/s MV A velocity: 86.50 cm/s  SHUNTS MV E/A ratio:  0.72        Systemic VTI:  0.13 m                            Systemic Diam: 2.10 cm Nelva Bush MD Electronically signed by Nelva Bush MD Signature Date/Time: 08/28/2020/9:56:00 AM    Final        Subjective: Feeling better, able to ambulate  Discharge Exam: Vitals:   08/30/20 0412 08/30/20 0725 08/30/20 0924 08/30/20 1141  BP: 128/84 126/86 120/73 116/71  Pulse: 90 81 95 91  Resp: 18 (!) 22 (!) 22 (!) 24  Temp: 97.7 F (36.5 C) (!) 97.5 F (36.4 C)  (!) 97.5 F (36.4 C)  TempSrc: Oral Oral  Oral  SpO2: 96% 94% 91% 96%  Weight:      Height:        General: Pt is alert, awake, not in acute distress Cardiovascular: RRR, S1/S2 +, no rubs, no gallops Respiratory: CTA bilaterally, no wheezing, no rhonchi Abdominal: Soft, NT, ND, bowel sounds + Extremities: no edema, no cyanosis    The results of significant diagnostics from this hospitalization (including imaging, microbiology, ancillary and laboratory) are listed below for reference.     Microbiology: Recent Results (from the past 240 hour(s))  Resp Panel by RT-PCR (Flu A&B, Covid) Nasopharyngeal Swab     Status: None   Collection Time: 08/22/20 12:45 PM   Specimen: Nasopharyngeal Swab; Nasopharyngeal(NP) swabs in vial transport medium  Result Value Ref Range Status   SARS Coronavirus 2 by RT PCR NEGATIVE NEGATIVE Final    Comment: (NOTE) SARS-CoV-2 target nucleic acids are NOT DETECTED.  The SARS-CoV-2 RNA is generally detectable in upper respiratory specimens during the acute phase of infection. The lowest concentration of SARS-CoV-2 viral copies this assay can detect is 138 copies/mL. A negative result does not preclude  SARS-Cov-2 infection and should not be used as the sole basis for treatment or other patient management decisions. A negative result may occur with  improper specimen collection/handling, submission of specimen other than nasopharyngeal swab, presence of viral mutation(s) within the areas targeted by this assay, and inadequate number of viral copies(<138 copies/mL). A negative result must be combined with clinical observations, patient history, and epidemiological information. The expected result  is Negative.  Fact Sheet for Patients:  EntrepreneurPulse.com.au  Fact Sheet for Healthcare Providers:  IncredibleEmployment.be  This test is no t yet approved or cleared by the [place] FDA and  has been authorized for detection and/or diagnosis of SARS-CoV-2 by FDA under an Emergency Use Authorization (EUA). This EUA will remain  in effect (meaning this test can be used) for the duration of the COVID-19 declaration under Section 564(b)(1) of the Act, 21 U.S.C.section 360bbb-3(b)(1), unless the authorization is terminated  or revoked sooner.       Influenza A by PCR NEGATIVE NEGATIVE Final   Influenza B by PCR NEGATIVE NEGATIVE Final    Comment: (NOTE) The Xpert Xpress SARS-CoV-2/FLU/RSV plus assay is intended as an aid in the diagnosis of influenza from Nasopharyngeal swab specimens and should not be used as a sole basis for treatment. Nasal washings and aspirates are unacceptable for Xpert Xpress SARS-CoV-2/FLU/RSV testing.  Fact Sheet for Patients: EntrepreneurPulse.com.au  Fact Sheet for Healthcare Providers: IncredibleEmployment.be  This test is not yet approved or cleared by the [place] FDA and has been authorized for detection and/or diagnosis of SARS-CoV-2 by FDA under an Emergency Use Authorization (EUA). This EUA will remain in effect (meaning this test can be used) for the duration of  the COVID-19 declaration under Section 564(b)(1) of the Act, 21 U.S.C. section 360bbb-3(b)(1), unless the authorization is terminated or revoked.  Performed at [facility], [address] [place], [place] [address]   Blood culture (routine x 2)     Status: None   Collection Time: 08/22/20  3:18 PM   Specimen: BLOOD  Result Value Ref Range Status   Specimen Description BLOOD RIGHT ANTECUBITAL  Final   Special Requests   Final    BOTTLES DRAWN AEROBIC AND ANAEROBIC Blood Culture adequate volume   Culture   Final    NO GROWTH 5 DAYS Performed at [facility], [address], [place], [place] [address]    Report Status 08/27/2020 FINAL  Final  Blood culture (routine x 2)     Status: None   Collection Time: 08/22/20  3:18 PM   Specimen: BLOOD  Result Value Ref Range Status   Specimen Description BLOOD LEFT ANTECUBITAL  Final   Special Requests   Final    BOTTLES DRAWN AEROBIC AND ANAEROBIC Blood Culture adequate volume   Culture   Final    NO GROWTH 5 DAYS Performed at [facility], [address] [place], [place] [address]    Report Status 08/27/2020 FINAL  Final  Expectorated Sputum Assessment w Gram Stain, Rflx to Resp Cult     Status: None   Collection Time: 08/23/20  4:15 AM   Specimen: Expectorated Sputum  Result Value Ref Range Status   Specimen Description EXPECTORATED SPUTUM  Final   Special Requests NONE  Final   Sputum evaluation   Final    THIS SPECIMEN IS ACCEPTABLE FOR SPUTUM CULTURE Performed at [facility], [address] [place], [place] [address]    Report Status 08/24/2020 FINAL  Final  Culture, Respiratory w Gram Stain     Status: None   Collection Time: 08/23/20  4:15 AM  Result Value Ref Range Status   Specimen Description   Final    EXPECTORATED SPUTUM Performed at [facility], [address] [place], [place] [address]    Special Requests   Final    NONE Reflexed from [id number] Performed at  [facility], [address] [place], [place] [address]    Gram Stain   Final    MODERATE WBC PRESENT,BOTH PMN AND MONONUCLEAR MODERATE GRAM POSITIVE COCCI IN PAIRS IN CLUSTERS Performed at [facility], [place] [address], [place], [place] [address]    Culture   Final    MODERATE METHICILLIN RESISTANT STAPHYLOCOCCUS AUREUS   Report Status 08/26/2020 FINAL  Final   Organism ID, Bacteria METHICILLIN RESISTANT STAPHYLOCOCCUS AUREUS  Final      Susceptibility   Methicillin resistant staphylococcus aureus - MIC*    CIPROFLOXACIN <=0.5 SENSITIVE Sensitive     ERYTHROMYCIN >=8 RESISTANT Resistant     GENTAMICIN <=0.5 SENSITIVE Sensitive     OXACILLIN >=4 RESISTANT Resistant     TETRACYCLINE <=1 SENSITIVE Sensitive     VANCOMYCIN <=0.5 SENSITIVE Sensitive     TRIMETH/SULFA <=10 SENSITIVE Sensitive     CLINDAMYCIN <=0.25 SENSITIVE Sensitive     RIFAMPIN <=0.5 SENSITIVE Sensitive     Inducible Clindamycin NEGATIVE Sensitive     * MODERATE METHICILLIN RESISTANT STAPHYLOCOCCUS AUREUS  MRSA PCR Screening     Status: Abnormal   Collection Time: 08/24/20  3:15 AM   Specimen: Nasopharyngeal  Result Value Ref Range Status   MRSA by PCR POSITIVE (A) NEGATIVE Final    Comment:        The GeneXpert MRSA Assay (FDA approved for NASAL specimens only), is one component of a comprehensive MRSA colonization surveillance program. It is not intended to diagnose MRSA infection nor to guide or monitor treatment for MRSA infections. RESULT CALLED TO, READ BACK BY AND VERIFIED WITH: MIA YOUNG AT 0530 ON 08/24/2020 [place]. Performed at [facility], [place], [place] [address]   Acid Fast Smear (AFB)     Status: None   Collection Time: 08/24/20  4:15 AM   Specimen: Sputum  Result Value Ref Range Status   AFB Specimen Processing Concentration  Final   Acid Fast Smear Negative  Final    Comment: (NOTE) Performed At: [facility] [place],  [place] [id number] Rush Farmer MD EX:[id number]    Source (AFB) EXPECTORATED SPUTUM  Final    Comment: Performed at [facility], [address] [place], [place] [address]  Resp Panel by RT-PCR (Flu A&B, Covid) Nasopharyngeal Swab     Status: None   Collection Time: 08/28/20  3:50 PM   Specimen: Nasopharyngeal Swab; Nasopharyngeal(NP) swabs in vial transport medium  Result Value Ref Range Status   SARS Coronavirus 2 by RT PCR NEGATIVE NEGATIVE Final    Comment: (NOTE) SARS-CoV-2 target nucleic acids are NOT DETECTED.  The SARS-CoV-2 RNA is generally detectable in upper respiratory specimens during the acute phase of infection. The lowest concentration of SARS-CoV-2 viral copies this assay can detect is 138 copies/mL. A negative result does not preclude SARS-Cov-2 infection and should not be used as the sole basis for treatment or other patient management decisions. A negative result may occur with  improper specimen collection/handling, submission of specimen other than nasopharyngeal swab, presence of viral mutation(s) within the areas targeted by this assay, and inadequate number of viral copies(<138 copies/mL). A negative result must be combined with clinical observations, patient history, and epidemiological information. The expected result is Negative.  Fact Sheet for Patients:  EntrepreneurPulse.com.au  Fact Sheet for Healthcare Providers:  IncredibleEmployment.be  This test is no t yet approved or cleared by the [place] FDA and  has been authorized for detection and/or diagnosis of SARS-CoV-2 by  FDA under an Emergency Use Authorization (EUA). This EUA will remain  in effect (meaning this test can be used) for the duration of the COVID-19 declaration under Section 564(b)(1) of the Act, 21 U.S.C.section 360bbb-3(b)(1), unless the authorization is terminated  or revoked sooner.       Influenza A by PCR NEGATIVE NEGATIVE  Final   Influenza B by PCR NEGATIVE NEGATIVE Final    Comment: (NOTE) The Xpert Xpress SARS-CoV-2/FLU/RSV plus assay is intended as an aid in the diagnosis of influenza from Nasopharyngeal swab specimens and should not be used as a sole basis for treatment. Nasal washings and aspirates are unacceptable for Xpert Xpress SARS-CoV-2/FLU/RSV testing.  Fact Sheet for Patients: EntrepreneurPulse.com.au  Fact Sheet for Healthcare Providers: IncredibleEmployment.be  This test is not yet approved or cleared by the [place] FDA and has been authorized for detection and/or diagnosis of SARS-CoV-2 by FDA under an Emergency Use Authorization (EUA). This EUA will remain in effect (meaning this test can be used) for the duration of the COVID-19 declaration under Section 564(b)(1) of the Act, 21 U.S.C. section 360bbb-3(b)(1), unless the authorization is terminated or revoked.  Performed at [facility], [address] [place], [place] [address]      Labs: BNP (last 3 results) No results for input(s): BNP in the last 8760 hours. Basic Metabolic Panel: Recent Labs  Lab 08/25/20 0527 08/26/20 0531 08/27/20 0409 08/28/20 0606 08/29/20 0421  NA 142 142 143 139 139  K 3.3* 3.3* 3.7 3.6 3.8  CL 109 107 106 101 99  CO2 28 29 31 31  32  GLUCOSE 145* 81 105* 106* 109*  BUN 13 12 13 16 14   CREATININE 0.45* 0.39* 0.50* 0.32* <0.30*  CALCIUM 7.8* 8.0* 8.0* 8.0* 8.1*  MG 2.0 1.8 1.7 1.7 1.6*   Liver Function Tests: No results for input(s): AST, ALT, ALKPHOS, BILITOT, PROT, ALBUMIN in the last 168 hours. No results for input(s): LIPASE, AMYLASE in the last 168 hours. No results for input(s): AMMONIA in the last 168 hours. CBC: Recent Labs  Lab 08/25/20 0628 08/26/20 0531 08/27/20 0409 08/28/20 0606 08/29/20 0421  WBC 1.1* 1.7* 2.0* 1.7* 11.3*  NEUTROABS 0.2* 0.5* 0.7* 0.4* 8.8*  HGB 12.2* 12.4* 13.2 12.1* 12.8*  HCT 38.0* 39.2 41.9  38.2* 40.3  MCV 85.0 86.2 85.5 87.0 86.3  PLT 136* 130* 145* 142* 139*   Cardiac Enzymes: No results for input(s): CKTOTAL, CKMB, CKMBINDEX, TROPONINI in the last 168 hours. BNP: Invalid input(s): POCBNP CBG: No results for input(s): GLUCAP in the last 168 hours. D-Dimer No results for input(s): DDIMER in the last 72 hours. Hgb A1c No results for input(s): HGBA1C in the last 72 hours. Lipid Profile No results for input(s): CHOL, HDL, LDLCALC, TRIG, CHOLHDL, LDLDIRECT in the last 72 hours. Thyroid function studies No results for input(s): TSH, T4TOTAL, T3FREE, THYROIDAB in the last 72 hours.  Invalid input(s): FREET3 Anemia work up No results for input(s): VITAMINB12, FOLATE, FERRITIN, TIBC, IRON, RETICCTPCT in the last 72 hours. Urinalysis    Component Value Date/Time   COLORURINE AMBER (A) 08/22/2020 1253   APPEARANCEUR HAZY (A) 08/22/2020 1253   LABSPEC 1.024 08/22/2020 1253   PHURINE 5.0 08/22/2020 [place] 08/22/2020 [place] 08/22/2020 [place] 08/22/2020 [place] 08/22/2020 1253   PROTEINUR NEGATIVE 08/22/2020 1253   UROBILINOGEN 0.2 10/21/2012 1737   NITRITE NEGATIVE 08/22/2020 [place] 08/22/2020 1253  Sepsis Labs Invalid input(s): PROCALCITONIN,  WBC,  LACTICIDVEN Microbiology Recent Results (from the past 240 hour(s))  Resp Panel by RT-PCR (Flu A&B, Covid) Nasopharyngeal Swab     Status: None   Collection Time: 08/22/20 12:45 PM   Specimen: Nasopharyngeal Swab; Nasopharyngeal(NP) swabs in vial transport medium  Result Value Ref Range Status   SARS Coronavirus 2 by RT PCR NEGATIVE NEGATIVE Final    Comment: (NOTE) SARS-CoV-2 target nucleic acids are NOT DETECTED.  The SARS-CoV-2 RNA is generally detectable in upper respiratory specimens during the acute phase of infection. The lowest concentration of SARS-CoV-2 viral copies this assay can detect is 138 copies/mL. A  negative result does not preclude SARS-Cov-2 infection and should not be used as the sole basis for treatment or other patient management decisions. A negative result may occur with  improper specimen collection/handling, submission of specimen other than nasopharyngeal swab, presence of viral mutation(s) within the areas targeted by this assay, and inadequate number of viral copies(<138 copies/mL). A negative result must be combined with clinical observations, patient history, and epidemiological information. The expected result is Negative.  Fact Sheet for Patients:  EntrepreneurPulse.com.au  Fact Sheet for Healthcare Providers:  IncredibleEmployment.be  This test is no t yet approved or cleared by the [place] FDA and  has been authorized for detection and/or diagnosis of SARS-CoV-2 by FDA under an Emergency Use Authorization (EUA). This EUA will remain  in effect (meaning this test can be used) for the duration of the COVID-19 declaration under Section 564(b)(1) of the Act, 21 U.S.C.section 360bbb-3(b)(1), unless the authorization is terminated  or revoked sooner.       Influenza A by PCR NEGATIVE NEGATIVE Final   Influenza B by PCR NEGATIVE NEGATIVE Final    Comment: (NOTE) The Xpert Xpress SARS-CoV-2/FLU/RSV plus assay is intended as an aid in the diagnosis of influenza from Nasopharyngeal swab specimens and should not be used as a sole basis for treatment. Nasal washings and aspirates are unacceptable for Xpert Xpress SARS-CoV-2/FLU/RSV testing.  Fact Sheet for Patients: EntrepreneurPulse.com.au  Fact Sheet for Healthcare Providers: IncredibleEmployment.be  This test is not yet approved or cleared by the [place] FDA and has been authorized for detection and/or diagnosis of SARS-CoV-2 by FDA under an Emergency Use Authorization (EUA). This EUA will remain in effect (meaning this test can  be used) for the duration of the COVID-19 declaration under Section 564(b)(1) of the Act, 21 U.S.C. section 360bbb-3(b)(1), unless the authorization is terminated or revoked.  Performed at [facility], [address] [place], [place] [address]   Blood culture (routine x 2)     Status: None   Collection Time: 08/22/20  3:18 PM   Specimen: BLOOD  Result Value Ref Range Status   Specimen Description BLOOD RIGHT ANTECUBITAL  Final   Special Requests   Final    BOTTLES DRAWN AEROBIC AND ANAEROBIC Blood Culture adequate volume   Culture   Final    NO GROWTH 5 DAYS Performed at [facility], [address], [place], [place] [address]    Report Status 08/27/2020 FINAL  Final  Blood culture (routine x 2)     Status: None   Collection Time: 08/22/20  3:18 PM   Specimen: BLOOD  Result Value Ref Range Status   Specimen Description BLOOD LEFT ANTECUBITAL  Final   Special Requests   Final    BOTTLES DRAWN AEROBIC AND ANAEROBIC Blood Culture adequate volume   Culture   Final  NO GROWTH 5 DAYS Performed at [facility] [address] [place], [place] [address]    Report Status 08/27/2020 FINAL  Final  Expectorated Sputum Assessment w Gram Stain, Rflx to Resp Cult     Status: None   Collection Time: 08/23/20  4:15 AM   Specimen: Expectorated Sputum  Result Value Ref Range Status   Specimen Description EXPECTORATED SPUTUM  Final   Special Requests NONE  Final   Sputum evaluation   Final    THIS SPECIMEN IS ACCEPTABLE FOR SPUTUM CULTURE Performed at [facility], [address], [place], [place] [address]    Report Status 08/24/2020 FINAL  Final  Culture, Respiratory w Gram Stain     Status: None   Collection Time: 08/23/20  4:15 AM  Result Value Ref Range Status   Specimen Description   Final    EXPECTORATED SPUTUM Performed at [facility], [address], [place], [place] [address]    Special Requests   Final    NONE Reflexed  from [id number] Performed at [facility], [address] [place], [place] [address]    Gram Stain   Final    MODERATE WBC PRESENT,BOTH PMN AND MONONUCLEAR MODERATE GRAM POSITIVE COCCI IN PAIRS IN CLUSTERS Performed at [facility], [address], [place], [place] [address]    Culture   Final    MODERATE METHICILLIN RESISTANT STAPHYLOCOCCUS AUREUS   Report Status 08/26/2020 FINAL  Final   Organism ID, Bacteria METHICILLIN RESISTANT STAPHYLOCOCCUS AUREUS  Final      Susceptibility   Methicillin resistant staphylococcus aureus - MIC*    CIPROFLOXACIN <=0.5 SENSITIVE Sensitive     ERYTHROMYCIN >=8 RESISTANT Resistant     GENTAMICIN <=0.5 SENSITIVE Sensitive     OXACILLIN >=4 RESISTANT Resistant     TETRACYCLINE <=1 SENSITIVE Sensitive     VANCOMYCIN <=0.5 SENSITIVE Sensitive     TRIMETH/SULFA <=10 SENSITIVE Sensitive     CLINDAMYCIN <=0.25 SENSITIVE Sensitive     RIFAMPIN <=0.5 SENSITIVE Sensitive     Inducible Clindamycin NEGATIVE Sensitive     * MODERATE METHICILLIN RESISTANT STAPHYLOCOCCUS AUREUS  MRSA PCR Screening     Status: Abnormal   Collection Time: 08/24/20  3:15 AM   Specimen: Nasopharyngeal  Result Value Ref Range Status   MRSA by PCR POSITIVE (A) NEGATIVE Final    Comment:        The GeneXpert MRSA Assay (FDA approved for NASAL specimens only), is one component of a comprehensive MRSA colonization surveillance program. It is not intended to diagnose MRSA infection nor to guide or monitor treatment for MRSA infections. RESULT CALLED TO, READ BACK BY AND VERIFIED WITH: MIA YOUNG AT 0530 ON 08/24/2020 [place]. Performed at [facility], [place], [place] [address]   Acid Fast Smear (AFB)     Status: None   Collection Time: 08/24/20  4:15 AM   Specimen: Sputum  Result Value Ref Range Status   AFB Specimen Processing Concentration  Final   Acid Fast Smear Negative  Final    Comment: (NOTE) Performed At: [facility] [place], [place] [id number] Rush Farmer MD OI:[id number]    Source (AFB) EXPECTORATED SPUTUM  Final    Comment: Performed at [facility], [address] [place], [place] [address]  Resp Panel by RT-PCR (Flu A&B, Covid) Nasopharyngeal Swab     Status: None   Collection Time: 08/28/20  3:50 PM   Specimen: Nasopharyngeal  Swab; Nasopharyngeal(NP) swabs in vial transport medium  Result Value Ref Range Status   SARS Coronavirus 2 by RT PCR NEGATIVE NEGATIVE Final    Comment: (NOTE) SARS-CoV-2 target nucleic acids are NOT DETECTED.  The SARS-CoV-2 RNA is generally detectable in upper respiratory specimens during the acute phase of infection. The lowest concentration of SARS-CoV-2 viral copies this assay can detect is 138 copies/mL. A negative result does not preclude SARS-Cov-2 infection and should not be used as the sole basis for treatment or other patient management decisions. A negative result may occur with  improper specimen collection/handling, submission of specimen other than nasopharyngeal swab, presence of viral mutation(s) within the areas targeted by this assay, and inadequate number of viral copies(<138 copies/mL). A negative result must be combined with clinical observations, patient history, and epidemiological information. The expected result is Negative.  Fact Sheet for Patients:  EntrepreneurPulse.com.au  Fact Sheet for Healthcare Providers:  IncredibleEmployment.be  This test is no t yet approved or cleared by the [place] FDA and  has been authorized for detection and/or diagnosis of SARS-CoV-2 by FDA under an Emergency Use Authorization (EUA). This EUA will remain  in effect (meaning this test can be used) for the duration of the COVID-19 declaration under Section 564(b)(1) of the Act, 21 U.S.C.section 360bbb-3(b)(1), unless the authorization is terminated  or revoked sooner.        Influenza A by PCR NEGATIVE NEGATIVE Final   Influenza B by PCR NEGATIVE NEGATIVE Final    Comment: (NOTE) The Xpert Xpress SARS-CoV-2/FLU/RSV plus assay is intended as an aid in the diagnosis of influenza from Nasopharyngeal swab specimens and should not be used as a sole basis for treatment. Nasal washings and aspirates are unacceptable for Xpert Xpress SARS-CoV-2/FLU/RSV testing.  Fact Sheet for Patients: EntrepreneurPulse.com.au  Fact Sheet for Healthcare Providers: IncredibleEmployment.be  This test is not yet approved or cleared by the [place] FDA and has been authorized for detection and/or diagnosis of SARS-CoV-2 by FDA under an Emergency Use Authorization (EUA). This EUA will remain in effect (meaning this test can be used) for the duration of the COVID-19 declaration under Section 564(b)(1) of the Act, 21 U.S.C. section 360bbb-3(b)(1), unless the authorization is terminated or revoked.  Performed at [facility], [address] [place], [place] [address]      Time coordinating discharge: 28mins  SIGNED:   Kathie Dike, MD  [facility] 08/30/2020, 12:09 PM   If 7PM-7AM, please contact night-coverage [email or web address]

## 2020-08-30 NOTE — TOC Transition Note (Signed)
Transition of Care [facility]) - CM/SW Discharge Note   Patient Details  Name: Marcus [name] MRN: [id number] Date of Birth: Apr 21, 1960  Transition of Care [facility] CM/SW Contact:  Beverly Sessions, RN Phone Number: 08/30/2020, 1:56 PM   Clinical Narrative:    Patient has made it clear that he is no longer willing to go to SNF, and will be returning home  PT has worked again with patient. Patient had omproved mobility today and per PT "enough improved abilities and safety to DC home"  Brother [name] notified.  Patient and brother in agreement for patient to return home  PT and OT have recommended home health.  I have reached out to all local home health agencies and there is not anyone currently that can accept patient.  Patient and brother aware that patient will not have home health services. MD notified  RW delivered to room prior to discharge.  BSC to be delivered to home  Due to patient being A&O x4. Patient and brother being agreement for home discharge,  And PT and OT recommendations being upgraded to home health discharge APS report not indicated   Brother to transport at discharge, and bring portable O2    Final next level of care: Home/Self Care Barriers to Discharge: No Barriers Identified   Patient Goals and CMS Choice Patient states their goals for this hospitalization and ongoing recovery are:: family feels he needs short term rehab CMS Medicare.gov Compare Post Acute Care list provided to:: Patient Represenative (must comment) Choice offered to / list presented to : Sibling  Discharge Placement                       Discharge Plan and Services                DME Arranged: [place]ford Rile rolling,3-N-1 DME Agency: [employer] Date DME Agency Contacted: 08/30/20   Representative spoke with at DME Agency: [place] ([place]) Interventions     Readmission Risk Interventions Readmission Risk Prevention Plan 08/25/2020   Transportation Screening Complete  PCP or Specialist Appt within 3-5 Days Complete  HRI or [place] Complete  Social Work Consult for [place] Planning/Counseling Complete  Palliative Care Screening Complete  Medication Review Press photographer) Complete  Some recent data might be hidden

## 2020-08-30 NOTE — Telephone Encounter (Signed)
M-please schedule follow-up-weekly CBC lab x3-possibly [facility]. [Hospital follow-up for neutropenia]  Follow-up in 3 weeks-MD; labs-CBC/BMP-possible [facility].   GB

## 2020-08-30 NOTE — Progress Notes (Addendum)
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 CT scan showed cavitary lesion in the right upper lobe.  Suspect lung abscess. Pulminology following. Of note: pt has been hypothermic this hospitalization, refusing warming, thought to be 2/2 relative adrenal insufficiency.    PT Comments    Pt was in bed upon arriving. Eager to DC home. Author discussed pt's care plan with MD/CM prior to session. Pt did demonstrate safe abilities to get OOB, stand, and ambulate without AD + use 4 L o2. He was able to ascend/descend stairs without assistance. Overall did demonstrate enough improved abilities and safety to DC home. Recommend HHPT if able.    Follow Up Recommendations  Home health PT;Supervision - Intermittent     Equipment Recommendations  Rolling walker with 5" wheels;3in1 (PT)       Precautions / Restrictions Precautions Precautions: Fall Restrictions Weight Bearing Restrictions: No    Mobility  Bed Mobility Overal bed mobility: Needs Assistance Bed Mobility: Supine to Sit     Supine to sit: Supervision Sit to supine: Supervision   General bed mobility comments: no physical assistance required to exit bed or return to bed post session    Transfers Overall transfer level: Needs assistance Equipment used: Rolling walker (2 wheeled);None Transfers: Sit to/from Stand Sit to Stand: Supervision     Ambulation/Gait Ambulation/Gait assistance: Supervision Gait Distance (Feet): 75 Feet Assistive device: None Gait Pattern/deviations: Step-through pattern Gait velocity: decreased   General Gait Details: pt was able to ambulate on 3 L o2 75 ft without LOB or safety concern.   Stairs Stairs: Yes Stairs assistance:  Supervision Stair Management: One rail Left;Alternating pattern Number of Stairs: 4 General stair comments: Pt was able to ascend/decsnd 4 stair without assistance or vcs     Balance Overall balance assessment: Needs assistance Sitting-balance support: Bilateral upper extremity supported;Feet supported Sitting balance-Leahy Scale: Good     Standing balance support: Bilateral upper extremity supported;During functional activity Standing balance-Leahy Scale: Good       Cognition Arousal/Alertness: Awake/alert Behavior During Therapy: WFL for tasks assessed/performed Overall Cognitive Status: Within Functional Limits for tasks assessed    General Comments: Pt is A and O x 4             Pertinent Vitals/Pain Pain Assessment: No/denies pain Pain Score: 0-No pain Pain Intervention(s): Limited activity within patient's tolerance;Monitored during session;Premedicated before session;Repositioned           PT Goals (current goals can now be found in the care plan section) Acute Rehab PT Goals Patient Stated Goal: home Progress towards PT goals: Progressing toward goals    Frequency    Min 2X/week      PT Plan Discharge plan needs to be updated       AM-PAC PT "6 Clicks" Mobility   Outcome Measure  Help needed turning from your back to your side while in a flat bed without using bedrails?: None Help needed moving from lying on your back to sitting on the side of a flat bed without using bedrails?: None Help needed moving to and from a bed to a chair (including a wheelchair)?: None Help needed standing up from a chair using your arms (e.g., wheelchair or bedside chair)?: A  Little Help needed to walk in hospital room?: A Little Help needed climbing 3-5 steps with a railing? : A Little 6 Click Score: 21    End of Session Equipment Utilized During Treatment: Gait belt Activity Tolerance: Patient limited by fatigue Patient left: with call bell/phone within reach;in  chair;with chair alarm set Nurse Communication: Mobility status PT Visit Diagnosis: Unsteadiness on feet (R26.81);Muscle weakness (generalized) (M62.81);Difficulty in walking, not elsewhere classified (R26.2);Pain     Time: 1050-1100 PT Time Calculation (min) (ACUTE ONLY): 10 min  Charges:  $Gait Training: 8-22 mins                     Julaine Fusi PTA 08/30/20, 1:06 PM

## 2020-08-30 NOTE — Progress Notes (Signed)
AVS reviewed with pt and no concerns voiced. Pt verbalized understanding of all discharge instructions and will schedule f/u appointments as directed.

## 2020-08-30 NOTE — Telephone Encounter (Signed)
I'll take care of this tomorrow.

## 2020-08-30 NOTE — Addendum Note (Signed)
Addended by: Delice Bison E on: 08/30/2020 03:58 PM   Modules accepted: Orders

## 2020-08-31 ENCOUNTER — Telehealth: Payer: Self-pay

## 2020-08-31 NOTE — Telephone Encounter (Signed)
Called pt to set up appts, brother stated will give pt the message to call us back.

## 2020-08-31 NOTE — Telephone Encounter (Signed)
I have called [name], the brother of patient and every number in chart. I can not get in touch with anyone to schedule appt with infectious disease which needs to be set up on or around 09/25/20. I will continue to reach out. Please have patient call [phone] to schedule hospital follow up at [facility] Infectious Disease.

## 2020-09-05 NOTE — Telephone Encounter (Signed)
Pt is being followed by a case worker his brother is not doing anything else for pt they got into it with him and hes out per brother. Dellia Nims is the worker trying to make his appts for him. We got everything set up.

## 2020-09-06 ENCOUNTER — Encounter: Payer: Self-pay | Admitting: Emergency Medicine

## 2020-09-06 ENCOUNTER — Emergency Department: Payer: Medicaid Other

## 2020-09-06 ENCOUNTER — Emergency Department
Admission: EM | Admit: 2020-09-06 | Discharge: 2020-09-06 | Disposition: A | Discharge status: Home / Self Care | Payer: Medicaid Other | Attending: Emergency Medicine | Admitting: Emergency Medicine

## 2020-09-06 ENCOUNTER — Other Ambulatory Visit: Payer: Self-pay

## 2020-09-06 DIAGNOSIS — Z20822 Contact with and (suspected) exposure to covid-19: Secondary | ICD-10-CM | POA: Diagnosis not present

## 2020-09-06 DIAGNOSIS — I5032 Chronic diastolic (congestive) heart failure: Secondary | ICD-10-CM | POA: Insufficient documentation

## 2020-09-06 DIAGNOSIS — R0602 Shortness of breath: Secondary | ICD-10-CM | POA: Diagnosis present

## 2020-09-06 DIAGNOSIS — J441 Chronic obstructive pulmonary disease with (acute) exacerbation: Secondary | ICD-10-CM | POA: Diagnosis not present

## 2020-09-06 DIAGNOSIS — E871 Hypo-osmolality and hyponatremia: Secondary | ICD-10-CM | POA: Diagnosis not present

## 2020-09-06 DIAGNOSIS — Z7951 Long term (current) use of inhaled steroids: Secondary | ICD-10-CM | POA: Diagnosis not present

## 2020-09-06 DIAGNOSIS — F17210 Nicotine dependence, cigarettes, uncomplicated: Secondary | ICD-10-CM | POA: Diagnosis not present

## 2020-09-06 LAB — CBC WITH DIFFERENTIAL/PLATELET
Abs Immature Granulocytes: 0.04 10*3/uL (ref 0.00–0.07)
Basophils Absolute: 0 10*3/uL (ref 0.0–0.1)
Basophils Relative: 0 %
Eosinophils Absolute: 0 10*3/uL (ref 0.0–0.5)
Eosinophils Relative: 0 %
HCT: 42.7 % (ref 39.0–52.0)
Hemoglobin: 13.8 g/dL (ref 13.0–17.0)
Immature Granulocytes: 2 %
Lymphocytes Relative: 38 %
Lymphs Abs: 1 10*3/uL (ref 0.7–4.0)
MCH: 27.5 pg (ref 26.0–34.0)
MCHC: 32.3 g/dL (ref 30.0–36.0)
MCV: 85.2 fL (ref 80.0–100.0)
Monocytes Absolute: 0.3 10*3/uL (ref 0.1–1.0)
Monocytes Relative: 12 %
Neutro Abs: 1.3 10*3/uL — ABNORMAL LOW (ref 1.7–7.7)
Neutrophils Relative %: 48 %
Platelets: 148 10*3/uL — ABNORMAL LOW (ref 150–400)
RBC: 5.01 MIL/uL (ref 4.22–5.81)
RDW: 19.1 % — ABNORMAL HIGH (ref 11.5–15.5)
WBC: 2.7 10*3/uL — ABNORMAL LOW (ref 4.0–10.5)
nRBC: 0 % (ref 0.0–0.2)

## 2020-09-06 LAB — COMPREHENSIVE METABOLIC PANEL
ALT: 28 U/L (ref 0–44)
AST: 30 U/L (ref 15–41)
Albumin: 2.9 g/dL — ABNORMAL LOW (ref 3.5–5.0)
Alkaline Phosphatase: 231 U/L — ABNORMAL HIGH (ref 38–126)
Anion gap: 12 (ref 5–15)
BUN: 19 mg/dL (ref 6–20)
CO2: 25 mmol/L (ref 22–32)
Calcium: 8.6 mg/dL — ABNORMAL LOW (ref 8.9–10.3)
Chloride: 91 mmol/L — ABNORMAL LOW (ref 98–111)
Creatinine, Ser: 0.57 mg/dL — ABNORMAL LOW (ref 0.61–1.24)
GFR, Estimated: >60 mL/min (ref >60)
Glucose, Bld: 68 mg/dL — ABNORMAL LOW (ref 70–99)
Potassium: 4.2 mmol/L (ref 3.5–5.1)
Sodium: 128 mmol/L — ABNORMAL LOW (ref 135–145)
Total Bilirubin: 1.1 mg/dL (ref 0.3–1.2)
Total Protein: 7.9 g/dL (ref 6.5–8.1)

## 2020-09-06 LAB — RESP PANEL BY RT-PCR (FLU A&B, COVID) ARPGX2
Influenza A by PCR: NEGATIVE
Influenza B by PCR: NEGATIVE
SARS Coronavirus 2 by RT PCR: NEGATIVE

## 2020-09-06 LAB — TROPONIN I (HIGH SENSITIVITY)
Troponin I (High Sensitivity): 13 ng/L (ref <18)
Troponin I (High Sensitivity): 10 ng/L (ref <18)

## 2020-09-06 LAB — BRAIN NATRIURETIC PEPTIDE: B Natriuretic Peptide: 104.8 pg/mL — ABNORMAL HIGH (ref 0.0–100.0)

## 2020-09-06 LAB — LACTIC ACID, PLASMA
Lactic Acid, Venous: 1.1 mmol/L (ref 0.5–1.9)
Lactic Acid, Venous: 1.4 mmol/L (ref 0.5–1.9)

## 2020-09-06 IMAGING — CR DG CHEST 2V
1 series · 3 of 3 positions shown · non-contrast
Comparison: Chest x-ray [DATE].  CT [DATE].

CLINICAL DATA: Shortness of breath.  Cough.

EXAM:
CHEST - 2 VIEW

[Series 1: dg chest 2 view · 0.14mm/px · 3 of 3 slices shown]
[im 1/3]
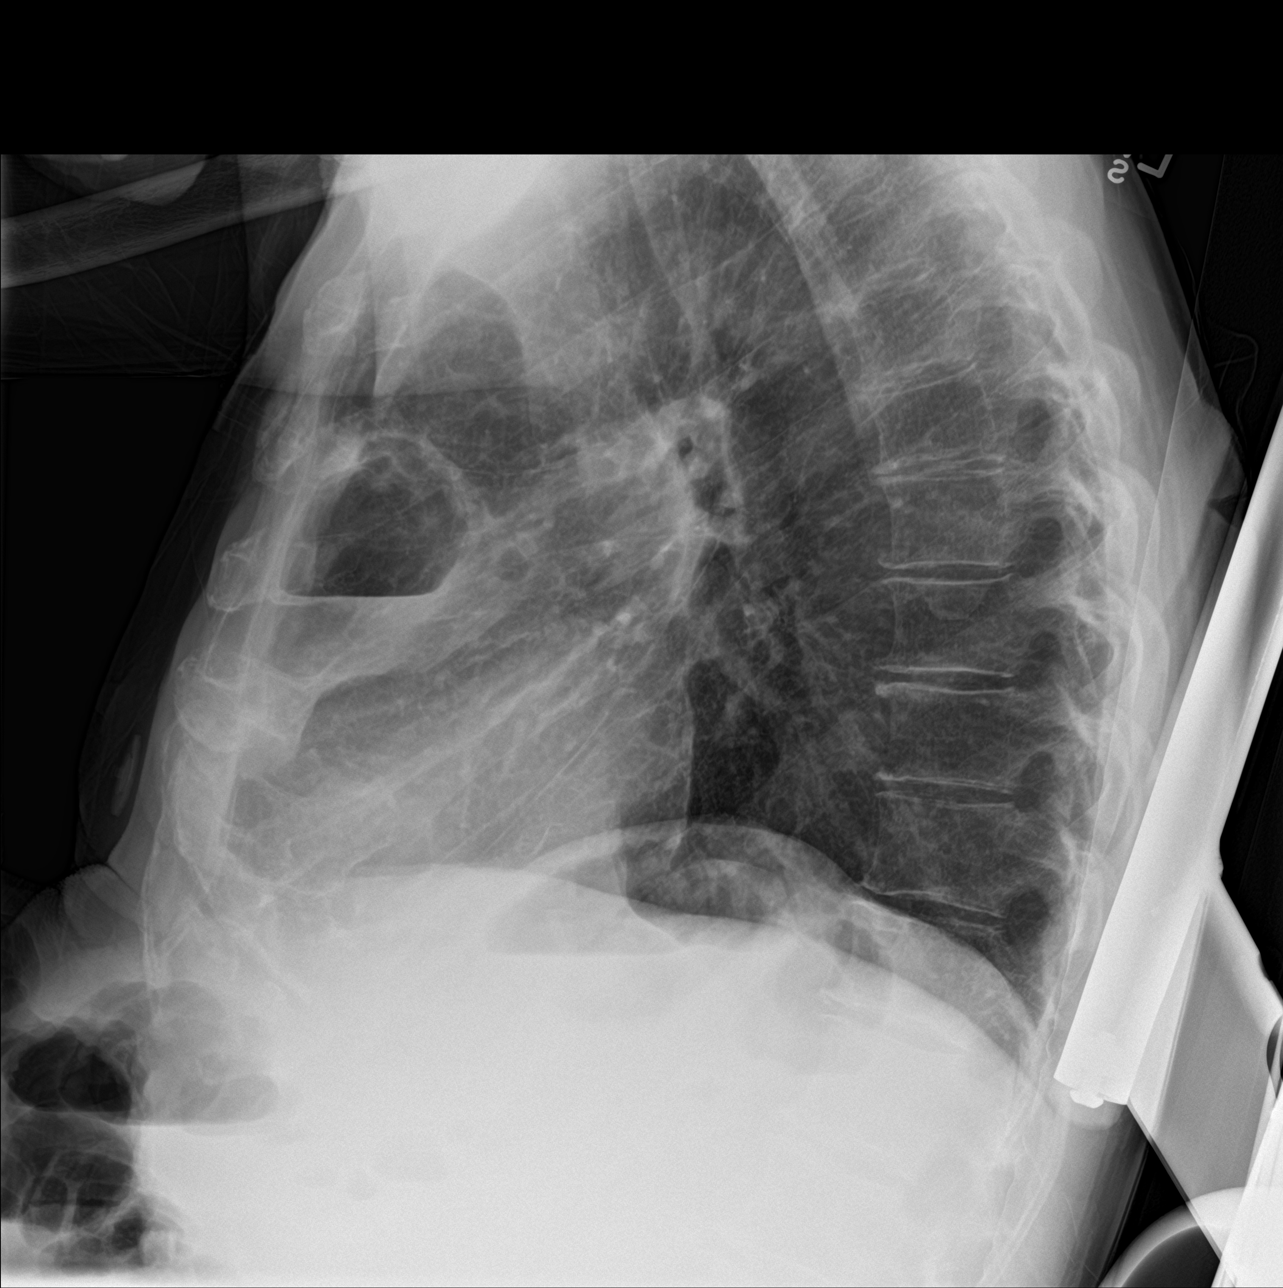
[im 2/3]
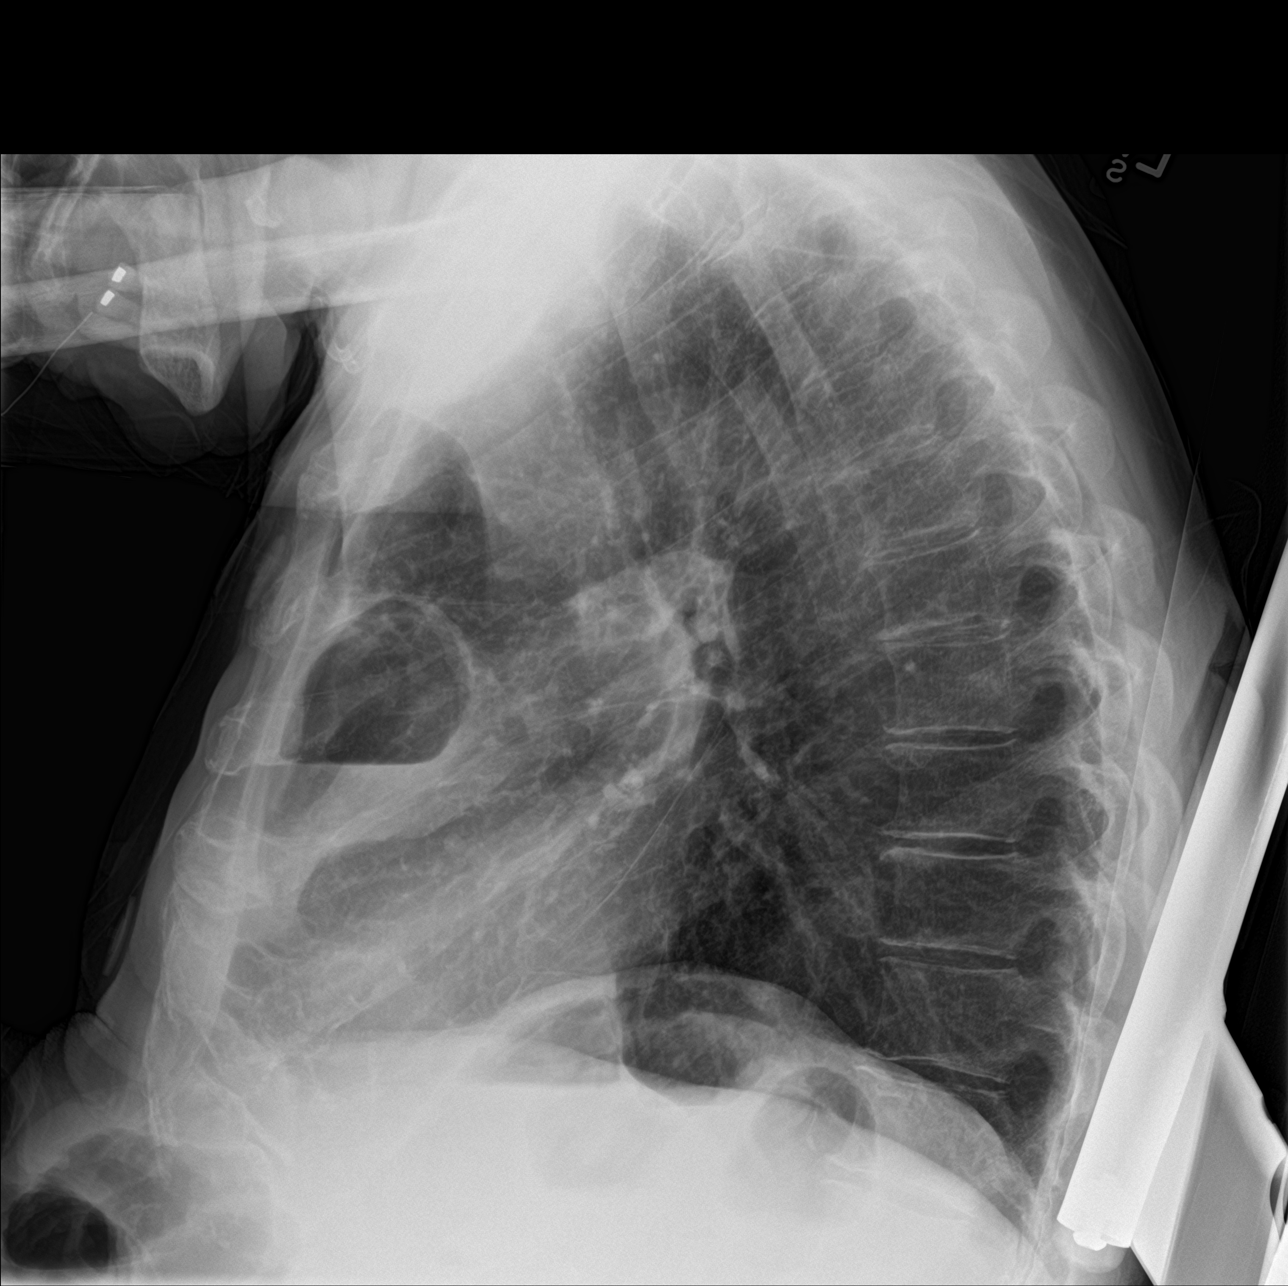
[im 3/3]
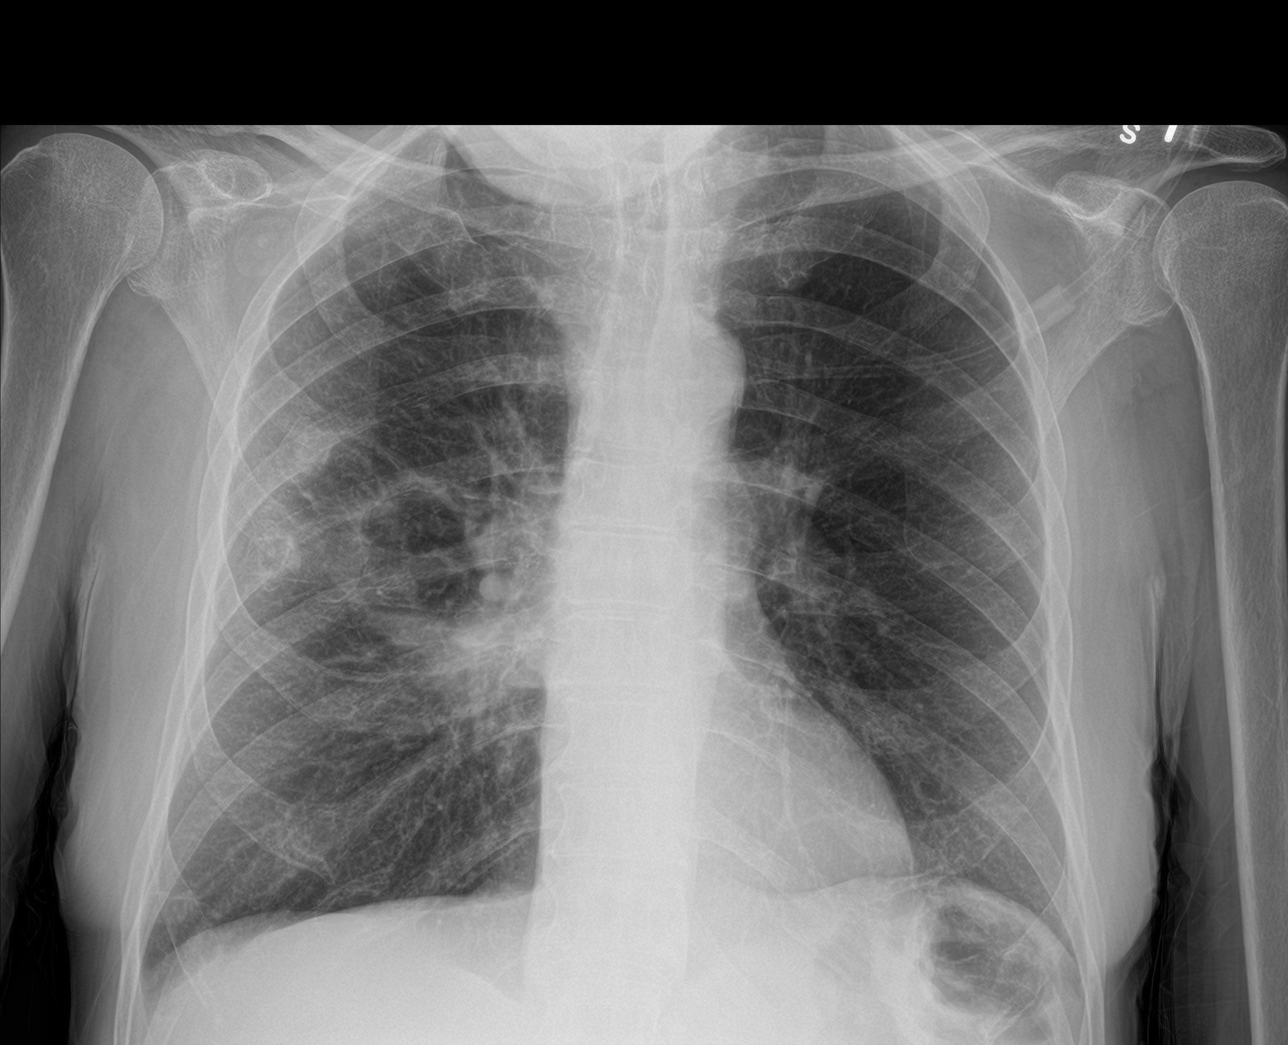

[3 of 3 positions shown; findings below may reference images not displayed]

FINDINGS: Mediastinum unremarkable. Heart size normal. Cavitary process in the
right mid lung again noted. No change identified. No pleural
effusion or pneumothorax. Old right rib fractures.
IMPRESSION: Cavitary process in the right mid lung again noted. Cavitary process
best identified by prior CT. Chest is unchanged from prior exam.

## 2020-09-06 MED ORDER — METHYLPREDNISOLONE SODIUM SUCC 125 MG IJ SOLR
125.0000 mg | Freq: Once | INTRAMUSCULAR | Status: AC
  Administered 2020-09-06: 125 mg via INTRAVENOUS
  Filled 2020-09-06: qty 2

## 2020-09-06 MED ORDER — SODIUM CHLORIDE 1 G PO TABS
2.0000 g | ORAL_TABLET | Freq: Once | ORAL | Status: DC
  Filled 2020-09-06: qty 2

## 2020-09-06 MED ORDER — IPRATROPIUM-ALBUTEROL 0.5-2.5 (3) MG/3ML IN SOLN
3.0000 mL | Freq: Once | RESPIRATORY_TRACT | Status: AC
  Administered 2020-09-06: 3 mL via RESPIRATORY_TRACT
  Filled 2020-09-06: qty 3

## 2020-09-06 MED ORDER — PREDNISONE 10 MG (21) PO TBPK: ORAL_TABLET | ORAL | 0 refills | Status: AC

## 2020-09-06 NOTE — ED Triage Notes (Signed)
First RN Note: Pt to ED via [facility] with c/o continued fever, per EMS pt on chronic 3L via [place], reports was dx with bacterial infection and placed on Doxycycline for infection. Per EMS pt continues to feel bad, reports fever 102 PTA.

## 2020-09-06 NOTE — ED Notes (Signed)
Pt given phone at this time to call for ride.

## 2020-09-06 NOTE — ED Provider Notes (Signed)
[facility] Emergency Department Provider Note ____________________________________________   Event Date/Time   First MD Initiated Contact with Patient 09/06/20 1613     (approximate)  I have reviewed the triage vital signs and the nursing notes.  HISTORY  Chief Complaint Fever and Shortness of Breath   HPI Marcus [name] is a 60 y.o. malewho presents to the ED for evaluation of fever, SOB.   Chart review indicates hx COPD, CHF, chronic respiratory failure on 2 L nasal cannula.  Rheumatoid arthritis, pancytopenia.   Recent medical admission from 5/18-5/26 with cavitary pneumonia to his right upper lobe, suspect a lung abscess, blood cultures negative and sputum culture with MRSA, discharged with 6 weeks of oral doxycycline.  Patient reports he followed up with his PCP today as an outpatient, noted to have a fever, and so was directed to the ED for further evaluation.  He reports that he has been feeling more weak in a generalized fashion, shortness of breath, cough with increased sputum production, increased lower extremity swelling over the past "couple weeks."  Reports his breathing feels better when he lays flat.  Reports he did not know that he had a fever.  Denies syncopal episodes, chest pain or abdominal pain, emesis or diarrhea.  Denies falls or trauma.  Past Medical History:  Diagnosis Date  . COPD (chronic obstructive pulmonary disease) ([place])   . 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 Hernia, epigastric   . Pancytopenia ([place])   . Seropositive rheumatoid arthritis ([place])   . Tobacco use disorder     Patient Active Problem List   Diagnosis Date Noted  . Acquired neutropenia ([place]) 08/28/2020  . Hypothermia 08/24/2020  . Cavitary pneumonia 08/22/2020  . Acute on chronic respiratory failure with hypoxia ([place]) 08/22/2020  . Hyponatremia 08/04/2020  . Generalized weakness 08/04/2020  . Dehydration 08/03/2020  . Septic shock ([place]) 06/29/2020  . CAP (community  acquired pneumonia) 06/29/2020  . COPD exacerbation ([place]) 06/11/2020  . Chest pain 06/11/2020  . Cellulitis 06/11/2020  . Chronic diastolic CHF (congestive heart failure) ([place]) 06/11/2020  . Severe sepsis with septic shock ([place]) 01/20/2020  . Acute hypoxemic respiratory failure ([place]) 01/20/2020  . Hypokalemia 01/20/2020  . Perforated prepyloric ulcer 01/18/2020 01/18/2020  . Pneumoperitoneum 01/17/2020  . COPD with acute exacerbation ([place]) 08/18/2019  . 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 Anemia 11/14/2015  . Seropositive rheumatoid arthritis ([place])   . Chronic obstructive pulmonary disease ([place])   . Moderate protein-calorie malnutrition ([place]) 07/24/2015  . Pancytopenia ([place]) 07/23/2015  . 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 Leukopenia 10/21/2012  . 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 Abnormal EKG 10/21/2012  . Joint pain 10/21/2012  . Splenomegaly 10/21/2012    Past Surgical History:  Procedure Laterality Date  . INCISIONAL HERNIA REPAIR  01/17/2020   Procedure: HERNIA REPAIR INCISIONAL AND Silvestre Gunner;  Surgeon: Kinsinger, Arta Bruce, MD;  Location: [facility] ORS;  Service: General;;  . LAPAROTOMY N/A 01/17/2020   Procedure: EXPLORATORY LAPAROTOMY;  Surgeon: Kieth Brightly Arta Bruce, MD;  Location: [facility] ORS;  Service: General;  Laterality: N/A;    Prior to Admission medications   Medication Sig Start Date End Date Taking? Authorizing Provider  predniSONE (STERAPRED UNI-PAK 21 TAB) 10 MG (21) TBPK tablet Take 5 tablets (50 mg total) by mouth daily for 4 days, THEN 4 tablets (40 mg total) daily for 2 days, THEN 3 tablets (30 mg total) daily for 2 days, THEN 2 tablets (20 mg total) daily for 2 days. 09/06/20 09/16/20  Yes Vladimir Crofts, MD  albuterol (PROVENTIL) (2.5 MG/3ML) 0.083% nebulizer solution Take 3 mLs (2.5 mg total) by nebulization every 6 (six) hours as needed for wheezing or shortness of breath. 08/30/20   Kathie Dike, MD  albuterol (VENTOLIN HFA) 108 (90 Base) MCG/ACT inhaler Inhale 2 puffs  into the lungs every 6 (six) hours as needed for wheezing or shortness of breath. 08/30/20   Kathie Dike, MD  doxycycline (VIBRA-TABS) 100 MG tablet Take 1 tablet (100 mg total) by mouth every 12 (twelve) hours. 08/30/20   Kathie Dike, MD  feeding supplement (ENSURE ENLIVE / ENSURE PLUS) LIQD Take 237 mLs by mouth 2 (two) times daily between meals. 08/04/20   Manuella Ghazi, Pratik D, DO  guaiFENesin-dextromethorphan (ROBITUSSIN DM) 100-10 MG/5ML syrup Take 5 mLs by mouth every 4 (four) hours as needed for cough. Patient not taking: Reported on 08/03/2020 07/01/20   Heath Lark D, DO  mometasone-formoterol (DULERA) 100-5 MCG/ACT AERO Inhale 2 puffs into the lungs 2 (two) times daily. 08/30/20   Kathie Dike, MD  pantoprazole (PROTONIX) 40 MG tablet Take 1 tablet (40 mg total) by mouth daily for 10 days. 07/02/20 07/12/20  Manuella Ghazi, Pratik D, DO  predniSONE (DELTASONE) 10 MG tablet Take 1 tablet (10 mg total) by mouth 2 (two) times daily with a meal. 08/30/20   Kathie Dike, MD    Allergies Levaquin [levofloxacin in d5w] and Methotrexate  Family History  Problem Relation Age of Onset  . CAD Brother   . COPD Brother     Social History Social History   Tobacco Use  . Smoking status: Current Every Day Smoker    Packs/day: 1.00    Types: Cigarettes  . Smokeless tobacco: Never Used  Substance Use Topics  . Alcohol use: No    Alcohol/week: 0.0 standard drinks  . Drug use: Yes    Types: Marijuana    Review of Systems  Constitutional: Positive for fever/chills.  Positive for generalized weakness Eyes: No visual changes. ENT: No sore throat. Cardiovascular: Denies chest pain. Respiratory: Positive for productive cough and shortness of breath. Gastrointestinal: No abdominal pain.  No nausea, no vomiting.  No diarrhea.  No constipation. Genitourinary: Negative for dysuria. Musculoskeletal: Negative for back pain.  Positive for increased lower extremity swelling Skin: Negative for  rash. Neurological: Negative for headaches, focal weakness or numbness.  ____________________________________________   PHYSICAL EXAM:  VITAL SIGNS: Vitals:   09/06/20 1800 09/06/20 1918  BP: 109/65 115/69  Pulse: 84 87  Resp: (!) 21 20  Temp:    SpO2: 93% 92%    Constitutional: Alert and oriented.  Appears uncomfortable, sitting upright in bed.  Conversational tachypnea. Eyes: Conjunctivae are normal. PERRL. EOMI. Head: Atraumatic. Nose: No congestion/rhinnorhea. Mouth/Throat: Mucous membranes are moist.  Oropharynx non-erythematous. Neck: No stridor. No cervical spine tenderness to palpation. Cardiovascular: Tachycardic rate, regular rhythm. Grossly normal heart sounds.  Good peripheral circulation. Respiratory: Tachypnea to the mid 20s.  Diffuse and scattered expiratory wheezes.  No retractions. Gastrointestinal: Soft , nondistended, nontender to palpation. No CVA tenderness. Soft and reducible incisional hernia to the superior aspect of midline supraumbilical surgical scar. Musculoskeletal: No lower extremity tenderness.  No joint effusions. No signs of acute trauma. Pitting edema to bilateral lower extremities symmetrically up to the knees without overlying skin changes or signs of trauma Neurologic:  Normal speech and language. No gross focal neurologic deficits are appreciated.  Skin:  Skin is warm, dry and intact. No rash noted. Psychiatric: Mood and affect are normal. Speech  and behavior are normal.  ____________________________________________   LABS (all labs ordered are listed, but only abnormal results are displayed)  Labs Reviewed  COMPREHENSIVE METABOLIC PANEL - Abnormal; Notable for the following components:      Result Value   Sodium 128 (*)    Chloride 91 (*)    Glucose, Bld 68 (*)    Creatinine, Ser 0.57 (*)    Calcium 8.6 (*)    Albumin 2.9 (*)    Alkaline Phosphatase 231 (*)    All other components within normal limits  CBC WITH  DIFFERENTIAL/PLATELET - Abnormal; Notable for the following components:   WBC 2.7 (*)    RDW 19.1 (*)    Platelets 148 (*)    Neutro Abs 1.3 (*)    All other components within normal limits  BRAIN NATRIURETIC PEPTIDE - Abnormal; Notable for the following components:   B Natriuretic Peptide 104.8 (*)    All other components within normal limits  RESP PANEL BY RT-PCR (FLU A&B, COVID) ARPGX2  CULTURE, BLOOD (SINGLE)  LACTIC ACID, PLASMA  LACTIC ACID, PLASMA  URINALYSIS, COMPLETE (UACMP) WITH MICROSCOPIC  TROPONIN I (HIGH SENSITIVITY)  TROPONIN I (HIGH SENSITIVITY)   ____________________________________________  12 Lead EKG  Sinus tachycardia, rate of 103 bpm.  Normal axis.  Right bundle and left anterior fascicular blocks.  No evidence of acute ischemia.  Similar to EKG from 5/18 ____________________________________________  RADIOLOGY  ED MD interpretation: 2 view CXR reviewed by me with known right midlung cavitary lesion without evidence of acute process  Official radiology report(s): DG Chest 2 View  Result Date: 09/06/2020 CLINICAL DATA:  Shortness of breath.  Cough. EXAM: CHEST - 2 VIEW COMPARISON:  Chest x-ray 08/28/2020.  CT 08/22/2020. FINDINGS: Mediastinum unremarkable. Heart size normal. Cavitary process in the right mid lung again noted. No change identified. No pleural effusion or pneumothorax. Old right rib fractures. IMPRESSION: Cavitary process in the right mid lung again noted. Cavitary process best identified by prior CT. Chest is unchanged from prior exam. Electronically Signed   By: Marcello Moores  Register   On: 09/06/2020 14:04    ____________________________________________   PROCEDURES and INTERVENTIONS  Procedure(s) performed (including Critical Care):  .1-3 Lead EKG Interpretation Performed by: Vladimir Crofts, MD Authorized by: Vladimir Crofts, MD     Interpretation: normal     ECG rate:  97   ECG rate assessment: normal     Rhythm: sinus rhythm     Ectopy:  none     Conduction: normal      Medications  sodium chloride tablet 2 g (has no administration in time range)  ipratropium-albuterol (DUONEB) 0.5-2.5 (3) MG/3ML nebulizer solution 3 mL (3 mLs Nebulization Given 09/06/20 1709)  methylPREDNISolone sodium succinate (SOLU-MEDROL) 125 mg/2 mL injection 125 mg (125 mg Intravenous Given 09/06/20 1710)    ____________________________________________   MDM / ED COURSE   60 year old male with known history of cavitary pneumonia presents to the ED due to concern for outpatient fever, without evidence of sepsis, with evidence of COPD exacerbation, and ultimately amenable to outpatient management.  Presents tachycardic, tachypneic but remained stable throughout his stay in the ED.  Provided breathing treatments and steroids with significant clinical improvement and resolution of his tachycardia and tachypnea.  Blood work with hyponatremia, that was repleted orally with solid food during p.o. challenge and salt tablets.  Patient reports subacute and chronic lower extremity edema, and his BNP is a little bit elevated, but no comparison for this.  His recent echo  is without evidence of CHF.  Considering his resolution of symptoms with breathing treatments, his reports of improved shortness of breath while lying flat without orthopnea, I doubt significant CHF contributing to his respiratory symptoms.  Blood work is largely reassuring otherwise without evidence of sepsis or poorly treated infection.  Repeat blood cultures drawn.  Negative testing for COVID-19 and influenza.  On my reassessment, he looks well and he is requesting discharge, which I think is reasonable considering his largely benign work-up and how well connected to his as an outpatient.  Advised him with prescription for prednisone taper to bring him down over the next 10 days back to his chronic 2 mg twice daily dosing.  We discussed the importance of adherence to his doxycycline regimen and we discussed  return precautions for the ED.  Patient stable for outpatient management.   Clinical Course as of 09/06/20 1947  Thu Sep 06, 2020  1833 Reassessed.  Patient ports feeling much better.  He looks improved and his tachycardia has resolved.  Tachypnea has resolved.  We discussed largely benign work-up.  We discussed repeat troponin and likely outpatient management with steroid taper.  He is agreeable.  He reports that he wants to go home and is eager to do so considering how well he feels.  We discussed the importance of adherence to his doxycycline regimen, following up with his PCP in the next week as well as return precautions for the ED.  He expresses understanding and agreement. [DS]    Clinical Course User Index [DS] Vladimir Crofts, MD    ____________________________________________   FINAL CLINICAL IMPRESSION(S) / ED DIAGNOSES  Final diagnoses:  COPD exacerbation ([place])  Shortness of breath  Hyponatremia     ED Discharge Orders         Ordered    predniSONE (STERAPRED UNI-PAK 21 TAB) 10 MG (21) TBPK tablet        09/06/20 1919           Severina Sykora   Note:  This document was prepared using Dragon voice recognition software and may include unintentional dictation errors.   Vladimir Crofts, MD 09/06/20 [phone]

## 2020-09-06 NOTE — Discharge Instructions (Signed)
You are being discharged with a prescription for prednisone steroids to taper down over the next 10 days before returning to your chronic dosing of 10 mg twice daily.  You tested negative for COVID-19 and influenza.  Continue to take your doxycycline as prescribed for the next few weeks  Please follow-up with your PCP in the next week  If you develop any further worsening symptoms despite these measures, please return to the ED.

## 2020-09-06 NOTE — ED Triage Notes (Signed)
Pt comes into the ED via [place] c/o increased fever, weakness, SHOB, and known bacterial infection that has been uncontrolled so far on antibiotics.  Pt wears 3L O2 at all times.  Pt currently A&Ox4.Pt has COPD, CHF, etc.

## 2020-09-10 ENCOUNTER — Inpatient Hospital Stay: Payer: Medicaid Other | Attending: Internal Medicine

## 2020-09-10 ENCOUNTER — Inpatient Hospital Stay: Payer: Medicaid Other

## 2020-09-11 LAB — CULTURE, BLOOD (SINGLE): Culture: NO GROWTH

## 2020-09-17 ENCOUNTER — Inpatient Hospital Stay
Admission: EM | Admit: 2020-09-17 | Discharge: 2020-09-28 | DRG: 871 | Disposition: A | Discharge status: Home / Self Care | Payer: Medicaid Other | Attending: Internal Medicine | Admitting: Internal Medicine

## 2020-09-17 ENCOUNTER — Emergency Department: Payer: Medicaid Other

## 2020-09-17 DIAGNOSIS — D709 Neutropenia, unspecified: Secondary | ICD-10-CM

## 2020-09-17 DIAGNOSIS — Z6822 Body mass index (BMI) 22.0-22.9, adult: Secondary | ICD-10-CM

## 2020-09-17 DIAGNOSIS — D638 Anemia in other chronic diseases classified elsewhere: Secondary | ICD-10-CM | POA: Diagnosis present

## 2020-09-17 DIAGNOSIS — Z7189 Other specified counseling: Secondary | ICD-10-CM | POA: Diagnosis not present

## 2020-09-17 DIAGNOSIS — E43 Unspecified severe protein-calorie malnutrition: Secondary | ICD-10-CM | POA: Diagnosis present

## 2020-09-17 DIAGNOSIS — Z20822 Contact with and (suspected) exposure to covid-19: Secondary | ICD-10-CM | POA: Diagnosis present

## 2020-09-17 DIAGNOSIS — M059 Rheumatoid arthritis with rheumatoid factor, unspecified: Secondary | ICD-10-CM | POA: Diagnosis present

## 2020-09-17 DIAGNOSIS — B3789 Other sites of candidiasis: Secondary | ICD-10-CM | POA: Diagnosis present

## 2020-09-17 DIAGNOSIS — R042 Hemoptysis: Secondary | ICD-10-CM | POA: Diagnosis present

## 2020-09-17 DIAGNOSIS — I714 Abdominal aortic aneurysm, without rupture: Secondary | ICD-10-CM | POA: Diagnosis present

## 2020-09-17 DIAGNOSIS — F17210 Nicotine dependence, cigarettes, uncomplicated: Secondary | ICD-10-CM | POA: Diagnosis present

## 2020-09-17 DIAGNOSIS — Z79899 Other long term (current) drug therapy: Secondary | ICD-10-CM

## 2020-09-17 DIAGNOSIS — J189 Pneumonia, unspecified organism: Secondary | ICD-10-CM | POA: Diagnosis not present

## 2020-09-17 DIAGNOSIS — J15212 Pneumonia due to Methicillin resistant Staphylococcus aureus: Secondary | ICD-10-CM | POA: Diagnosis present

## 2020-09-17 DIAGNOSIS — R4182 Altered mental status, unspecified: Secondary | ICD-10-CM | POA: Diagnosis not present

## 2020-09-17 DIAGNOSIS — Z7951 Long term (current) use of inhaled steroids: Secondary | ICD-10-CM

## 2020-09-17 DIAGNOSIS — Z9081 Acquired absence of spleen: Secondary | ICD-10-CM

## 2020-09-17 DIAGNOSIS — I452 Bifascicular block: Secondary | ICD-10-CM | POA: Diagnosis present

## 2020-09-17 DIAGNOSIS — J850 Gangrene and necrosis of lung: Secondary | ICD-10-CM | POA: Diagnosis present

## 2020-09-17 DIAGNOSIS — D849 Immunodeficiency, unspecified: Secondary | ICD-10-CM | POA: Diagnosis present

## 2020-09-17 DIAGNOSIS — E871 Hypo-osmolality and hyponatremia: Secondary | ICD-10-CM | POA: Diagnosis present

## 2020-09-17 DIAGNOSIS — E876 Hypokalemia: Secondary | ICD-10-CM | POA: Diagnosis present

## 2020-09-17 DIAGNOSIS — L0291 Cutaneous abscess, unspecified: Secondary | ICD-10-CM | POA: Diagnosis present

## 2020-09-17 DIAGNOSIS — I5032 Chronic diastolic (congestive) heart failure: Secondary | ICD-10-CM | POA: Diagnosis present

## 2020-09-17 DIAGNOSIS — R441 Visual hallucinations: Secondary | ICD-10-CM | POA: Diagnosis present

## 2020-09-17 DIAGNOSIS — A4902 Methicillin resistant Staphylococcus aureus infection, unspecified site: Secondary | ICD-10-CM | POA: Diagnosis not present

## 2020-09-17 DIAGNOSIS — R32 Unspecified urinary incontinence: Secondary | ICD-10-CM | POA: Diagnosis present

## 2020-09-17 DIAGNOSIS — J9601 Acute respiratory failure with hypoxia: Secondary | ICD-10-CM

## 2020-09-17 DIAGNOSIS — J9621 Acute and chronic respiratory failure with hypoxia: Secondary | ICD-10-CM | POA: Diagnosis present

## 2020-09-17 DIAGNOSIS — M0500 Felty's syndrome, unspecified site: Secondary | ICD-10-CM | POA: Diagnosis present

## 2020-09-17 DIAGNOSIS — J852 Abscess of lung without pneumonia: Secondary | ICD-10-CM | POA: Diagnosis not present

## 2020-09-17 DIAGNOSIS — R918 Other nonspecific abnormal finding of lung field: Secondary | ICD-10-CM | POA: Diagnosis present

## 2020-09-17 DIAGNOSIS — K439 Ventral hernia without obstruction or gangrene: Secondary | ICD-10-CM | POA: Diagnosis present

## 2020-09-17 DIAGNOSIS — J449 Chronic obstructive pulmonary disease, unspecified: Secondary | ICD-10-CM | POA: Diagnosis present

## 2020-09-17 DIAGNOSIS — Z7952 Long term (current) use of systemic steroids: Secondary | ICD-10-CM

## 2020-09-17 DIAGNOSIS — D61818 Other pancytopenia: Secondary | ICD-10-CM | POA: Diagnosis present

## 2020-09-17 DIAGNOSIS — J851 Abscess of lung with pneumonia: Secondary | ICD-10-CM | POA: Diagnosis present

## 2020-09-17 DIAGNOSIS — Z825 Family history of asthma and other chronic lower respiratory diseases: Secondary | ICD-10-CM | POA: Diagnosis not present

## 2020-09-17 DIAGNOSIS — Z9981 Dependence on supplemental oxygen: Secondary | ICD-10-CM

## 2020-09-17 DIAGNOSIS — J441 Chronic obstructive pulmonary disease with (acute) exacerbation: Secondary | ICD-10-CM | POA: Diagnosis not present

## 2020-09-17 DIAGNOSIS — A419 Sepsis, unspecified organism: Principal | ICD-10-CM | POA: Diagnosis present

## 2020-09-17 DIAGNOSIS — J9691 Respiratory failure, unspecified with hypoxia: Secondary | ICD-10-CM | POA: Insufficient documentation

## 2020-09-17 DIAGNOSIS — Z09 Encounter for follow-up examination after completed treatment for conditions other than malignant neoplasm: Secondary | ICD-10-CM

## 2020-09-17 DIAGNOSIS — J984 Other disorders of lung: Secondary | ICD-10-CM | POA: Diagnosis not present

## 2020-09-17 DIAGNOSIS — J439 Emphysema, unspecified: Secondary | ICD-10-CM | POA: Diagnosis present

## 2020-09-17 DIAGNOSIS — R569 Unspecified convulsions: Secondary | ICD-10-CM | POA: Diagnosis present

## 2020-09-17 DIAGNOSIS — Z8249 Family history of ischemic heart disease and other diseases of the circulatory system: Secondary | ICD-10-CM | POA: Diagnosis not present

## 2020-09-17 DIAGNOSIS — R54 Age-related physical debility: Secondary | ICD-10-CM | POA: Diagnosis present

## 2020-09-17 DIAGNOSIS — Z515 Encounter for palliative care: Secondary | ICD-10-CM | POA: Diagnosis not present

## 2020-09-17 LAB — BLOOD GAS, VENOUS
Acid-Base Excess: 2.5 mmol/L — ABNORMAL HIGH (ref 0.0–2.0)
Bicarbonate: 29.3 mmol/L — ABNORMAL HIGH (ref 20.0–28.0)
O2 Saturation: 85.9 %
Patient temperature: 37
pCO2, Ven: 53 mmHg (ref 44.0–60.0)
pH, Ven: 7.35 (ref 7.250–7.430)
pO2, Ven: 54 mmHg — ABNORMAL HIGH (ref 32.0–45.0)

## 2020-09-17 LAB — CBC WITH DIFFERENTIAL/PLATELET
Abs Immature Granulocytes: 0.02 10*3/uL (ref 0.00–0.07)
Basophils Absolute: 0 10*3/uL (ref 0.0–0.1)
Basophils Relative: 1 %
Eosinophils Absolute: 0 10*3/uL (ref 0.0–0.5)
Eosinophils Relative: 0 %
HCT: 40.2 % (ref 39.0–52.0)
Hemoglobin: 12.9 g/dL — ABNORMAL LOW (ref 13.0–17.0)
Immature Granulocytes: 1 %
Lymphocytes Relative: 34 %
Lymphs Abs: 1.1 10*3/uL (ref 0.7–4.0)
MCH: 27 pg (ref 26.0–34.0)
MCHC: 32.1 g/dL (ref 30.0–36.0)
MCV: 84.1 fL (ref 80.0–100.0)
Monocytes Absolute: 0.5 10*3/uL (ref 0.1–1.0)
Monocytes Relative: 17 %
Neutro Abs: 1.5 10*3/uL — ABNORMAL LOW (ref 1.7–7.7)
Neutrophils Relative %: 47 %
Platelets: 190 10*3/uL (ref 150–400)
RBC: 4.78 MIL/uL (ref 4.22–5.81)
RDW: 18.5 % — ABNORMAL HIGH (ref 11.5–15.5)
WBC Morphology: NORMAL
WBC: 3.1 10*3/uL — ABNORMAL LOW (ref 4.0–10.5)
nRBC: 0 % (ref 0.0–0.2)

## 2020-09-17 LAB — COMPREHENSIVE METABOLIC PANEL
ALT: 12 U/L (ref 0–44)
AST: 21 U/L (ref 15–41)
Albumin: 2.4 g/dL — ABNORMAL LOW (ref 3.5–5.0)
Alkaline Phosphatase: 276 U/L — ABNORMAL HIGH (ref 38–126)
Anion gap: 7 (ref 5–15)
BUN: 33 mg/dL — ABNORMAL HIGH (ref 6–20)
CO2: 28 mmol/L (ref 22–32)
Calcium: 7.9 mg/dL — ABNORMAL LOW (ref 8.9–10.3)
Chloride: 96 mmol/L — ABNORMAL LOW (ref 98–111)
Creatinine, Ser: 0.78 mg/dL (ref 0.61–1.24)
GFR, Estimated: >60 mL/min (ref >60)
Glucose, Bld: 137 mg/dL — ABNORMAL HIGH (ref 70–99)
Potassium: 3.3 mmol/L — ABNORMAL LOW (ref 3.5–5.1)
Sodium: 131 mmol/L — ABNORMAL LOW (ref 135–145)
Total Bilirubin: 1 mg/dL (ref 0.3–1.2)
Total Protein: 6.7 g/dL (ref 6.5–8.1)

## 2020-09-17 LAB — LACTIC ACID, PLASMA
Lactic Acid, Venous: 1.9 mmol/L (ref 0.5–1.9)
Lactic Acid, Venous: 2.4 mmol/L (ref 0.5–1.9)
Lactic Acid, Venous: 2.6 mmol/L (ref 0.5–1.9)
Lactic Acid, Venous: 1.3 mmol/L (ref 0.5–1.9)

## 2020-09-17 LAB — RESP PANEL BY RT-PCR (FLU A&B, COVID) ARPGX2
Influenza A by PCR: NEGATIVE
Influenza B by PCR: NEGATIVE
SARS Coronavirus 2 by RT PCR: NEGATIVE

## 2020-09-17 LAB — PROTIME-INR
INR: 1.1 (ref 0.8–1.2)
Prothrombin Time: 14 seconds (ref 11.4–15.2)

## 2020-09-17 LAB — APTT: aPTT: 45 seconds — ABNORMAL HIGH (ref 24–36)

## 2020-09-17 LAB — TROPONIN I (HIGH SENSITIVITY)
Troponin I (High Sensitivity): 7 ng/L (ref <18)
Troponin I (High Sensitivity): 6 ng/L (ref <18)

## 2020-09-17 LAB — PROCALCITONIN: Procalcitonin: 0.3 ng/mL

## 2020-09-17 LAB — BRAIN NATRIURETIC PEPTIDE: B Natriuretic Peptide: 45.7 pg/mL (ref 0.0–100.0)

## 2020-09-17 IMAGING — CT CT ANGIO CHEST
2 of 6 series · 16 of 46 positions shown · IV contrast (APPLIED)
Comparison: CT [DATE], [DATE]

CLINICAL DATA: Hemoptysis, shortness of breath, abdominal
distension

EXAM:
CT ANGIOGRAPHY CHEST
CT ABDOMEN AND PELVIS WITH CONTRAST
TECHNIQUE: Multidetector CT imaging of the chest was performed using the
standard protocol during bolus administration of intravenous
contrast. Multiplanar CT image reconstructions and MIPs were
obtained to evaluate the vascular anatomy. Multidetector CT imaging
of the abdomen and pelvis was performed using the standard protocol
during bolus administration of intravenous contrast.
CONTRAST:  75mL OMNIPAQUE IOHEXOL 350 MG/ML SOLN

[Series 5: thins · axial · 0.69mm/px · z∈[-370,-50]mm · 13 of 352 slices shown]
[im 16/352  lung]
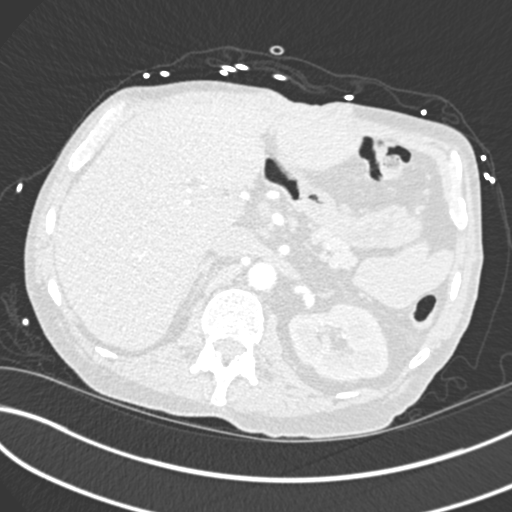
[im 46/352  soft-tissue]
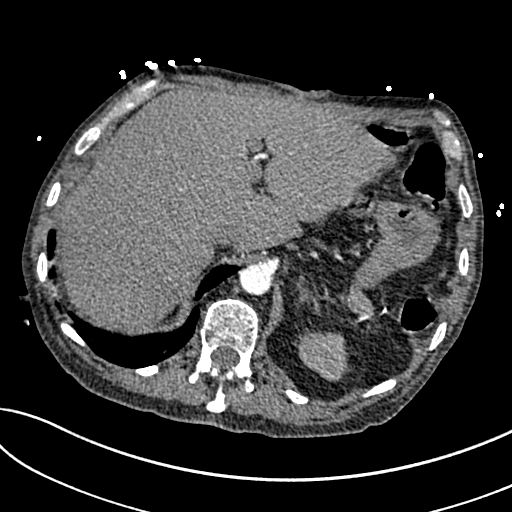
[im 77/352  lung]
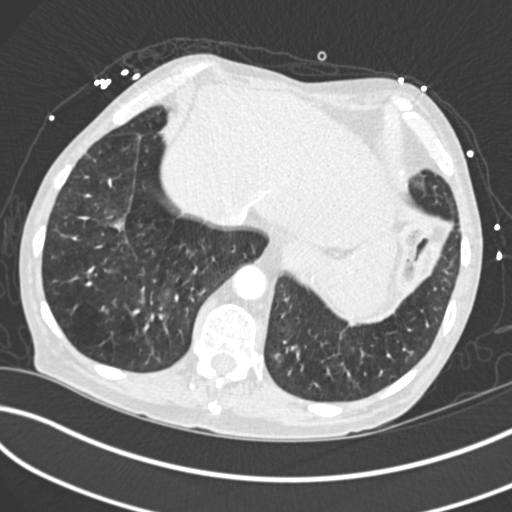
[im 92/352  soft-tissue]
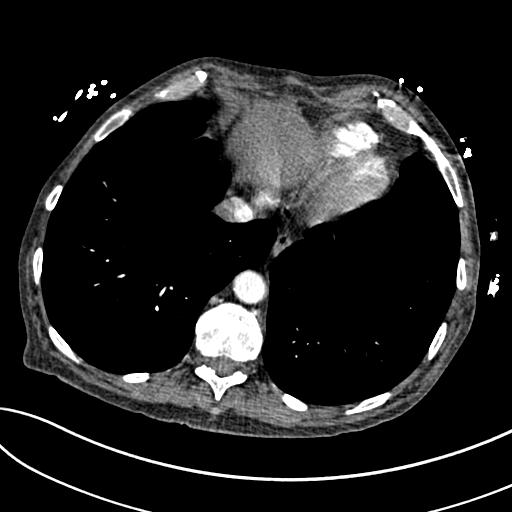
[im 123/352  lung]
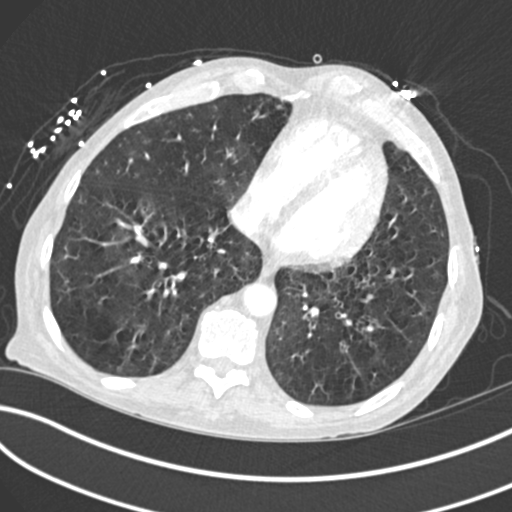
[im 153/352  soft-tissue]
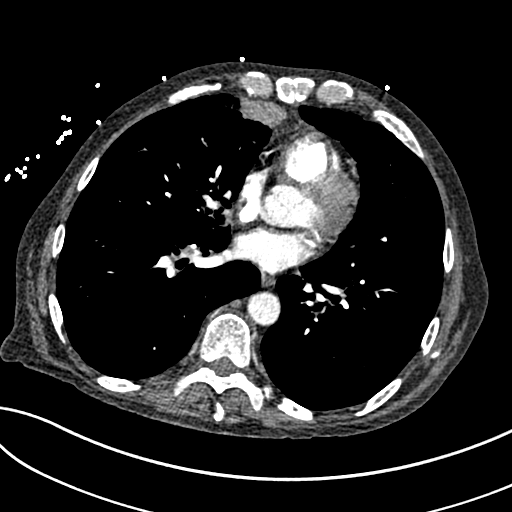
[im 184/352  lung]
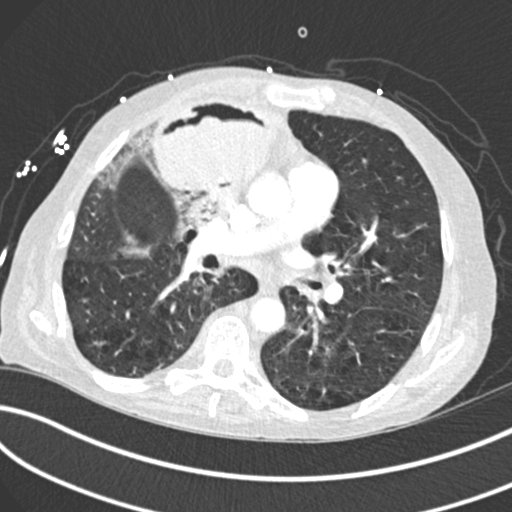
[im 199/352  soft-tissue]
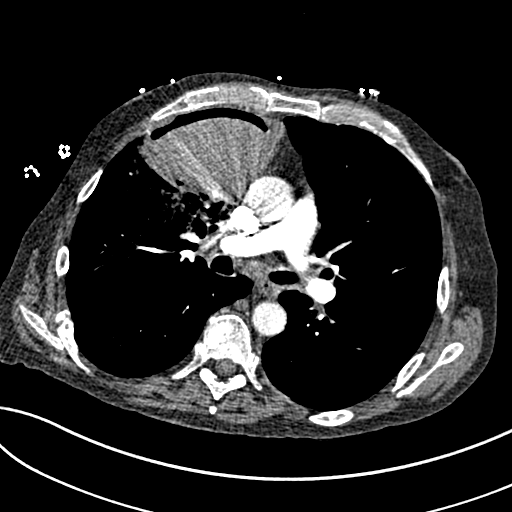
[im 229/352  lung]
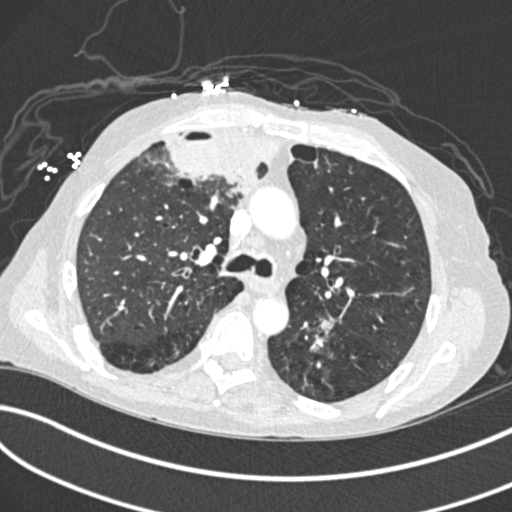
[im 260/352  soft-tissue]
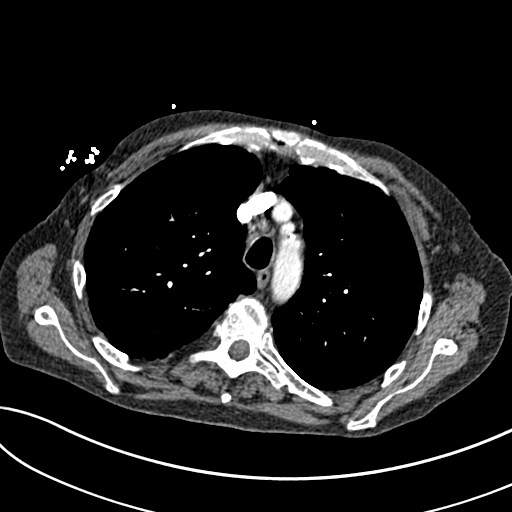
[im 275/352  lung]
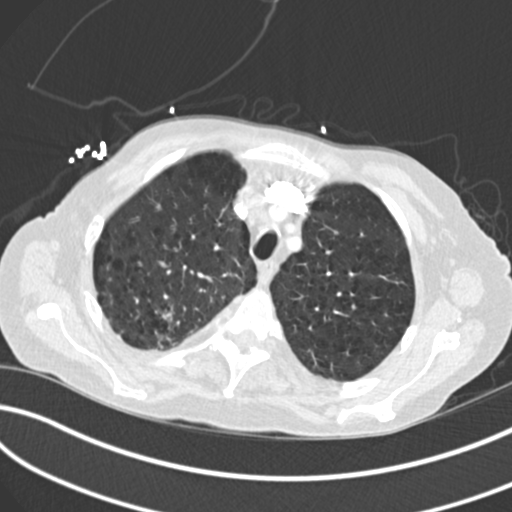
[im 306/352  soft-tissue]
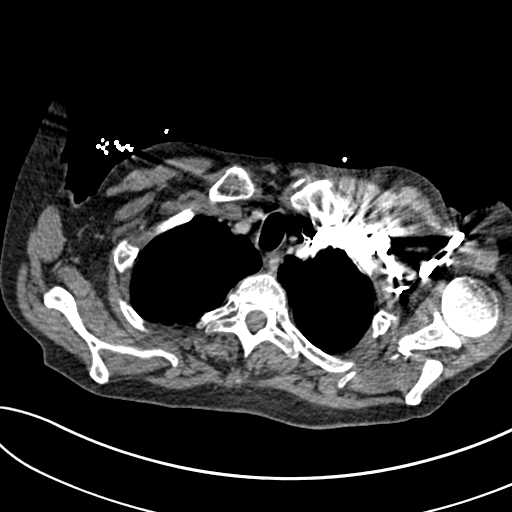
[im 336/352  lung]
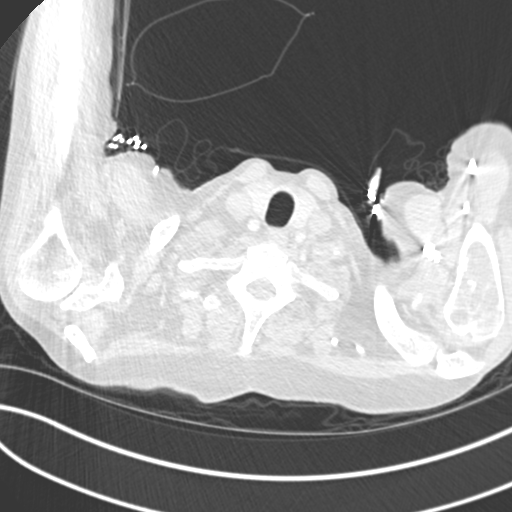

[Series 7: coronal mpr · coronal · 0.73mm/px · 3 of 139 slices shown]
[im 35/139  soft-tissue]
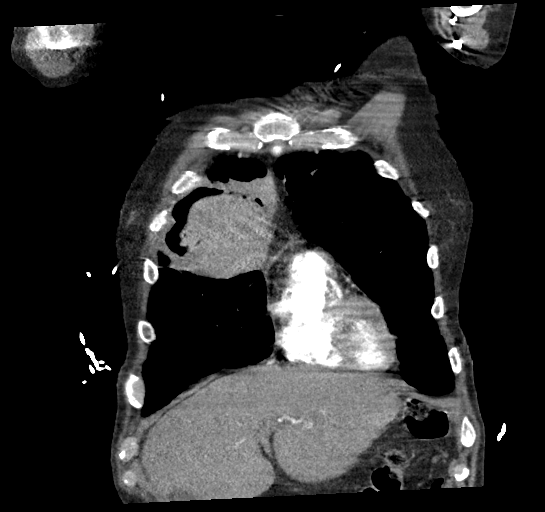
[im 70/139  soft-tissue]
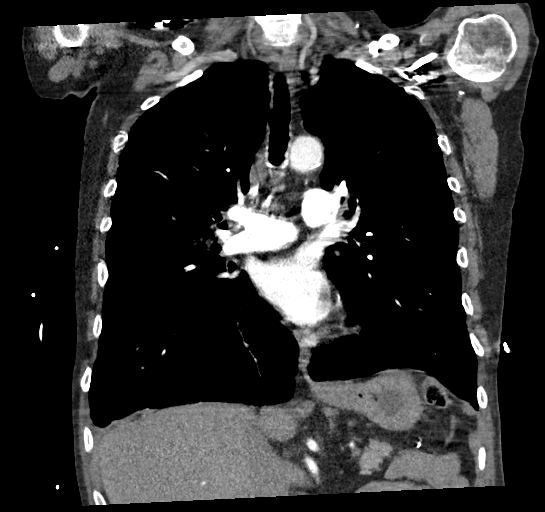
[im 104/139  soft-tissue]
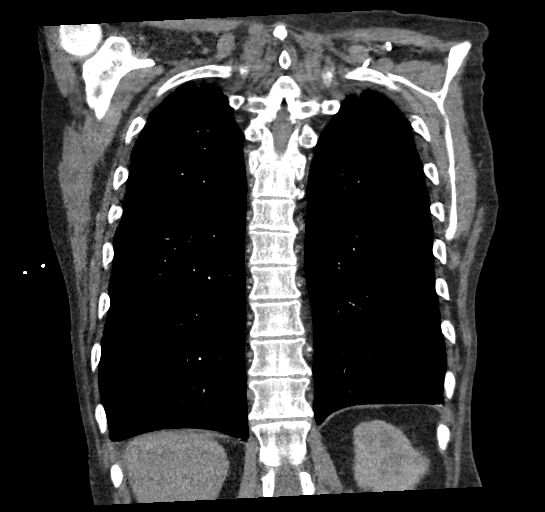

[16 of 46 positions shown; findings below may reference images not displayed]

FINDINGS: CTA CHEST FINDINGS

Cardiovascular: Satisfactory opacification of the pulmonary arteries
to the segmental level. No evidence of pulmonary embolism. Thoracic
aorta is normal in course and caliber. Atherosclerotic
calcifications of the aorta and coronary arteries. Normal heart
size. No pericardial effusion.

Mediastinum/Nodes: Mildly prominent mediastinal lymph nodes are
again noted, likely reactive. No axillary or hilar lymphadenopathy.

Lungs/Pleura: Redemonstrated cavitary mass located within the
inferomedial aspect of the right upper lobe measuring approximately
9.9 x 6.6 x 7.4 cm (previously measured approximately 7.8 x 4.5 x
5.5 cm on [DATE]. Increased surrounding consolidation and
ground-glass opacity within the adjacent right upper lobe and right
middle lobe previously seen cavitary lesion within the medial aspect
of the left upper lobe has resolved with small thin walled pleural
cyst remaining (series 6, image 44). Scattered areas of ground-glass
opacity within the bilateral lower lobes and upper lobes. Background
of moderate emphysema. No pleural effusion or pneumothorax.

Musculoskeletal: Remote right sixth and seventh rib fractures. No
new or acute osseous findings. No chest wall abnormality.

Review of the MIP images confirms the above findings.

CT ABDOMEN and PELVIS FINDINGS

Hepatobiliary: Cholelithiasis without evidence of pericholecystic
inflammation by CT. Liver within normal limits. No biliary
dilatation.

Pancreas: Unremarkable. No pancreatic ductal dilatation or
surrounding inflammatory changes.

Spleen: Prior splenectomy.

Adrenals/Urinary Tract: Unremarkable adrenal glands. Kidneys enhance
symmetrically without focal lesion, stone, or hydronephrosis.
Ureters are nondilated. Urinary bladder appears unremarkable.

Stomach/Bowel: Stomach is within normal limits. Appendix appears
normal (series 2, image 58). Incidental note of a small bowel-small
bowel intussusception within the mid left abdomen (series 2, image
38). No evidence of bowel wall thickening, distention, or
inflammatory changes.

Vascular/Lymphatic: Fusiform infrarenal abdominal aortic aneurysm
measuring up to 3.1 cm, unchanged. Aortoiliac atherosclerosis. No
abdominopelvic lymphadenopathy.

Reproductive: Prostate is unremarkable.

Other: No free fluid. No abdominopelvic fluid collection. No
pneumoperitoneum. Fat containing midline supraumbilical hernia
without evidence of complication.

Musculoskeletal: No acute or significant osseous findings.

Review of the MIP images confirms the above findings.
IMPRESSION: 1. No evidence of acute pulmonary embolism.
2. Enlarging cavitary mass within the inferomedial aspect of the
right upper lobe measuring up to 9.9 cm (previously measured
approximately 7.8 x 4.5 x 5.5 cm on [DATE]). Increased
surrounding consolidation and ground-glass opacity within the
adjacent right upper lobe and right middle lobe. Findings remain
most compatible with cavitary/necrotizing pneumonia.
3. Scattered areas of ground-glass opacity within the bilateral
lower lobes and upper lobes, concerning for multifocal infection.
4. Interval resolution of previously seen cavitary lesion within the
medial aspect of the left upper lobe with small thin walled pleural
cyst remaining.
5. Cholelithiasis without evidence of pericholecystic inflammation
by CT.
6. Incidental note of a small bowel-small bowel intussusception
within the mid left abdomen. No evidence of bowel obstruction.
7. Fat containing midline supraumbilical hernia without evidence of
complication.
8. Stable infrarenal abdominal aortic aneurysm measuring up to
cm. Recommend follow-up ultrasound every 3 years. This
recommendation follows ACR consensus guidelines: White Paper of the
ACR Incidental Findings Committee II on Vascular Findings. [HOSPITAL] [WE]; [DATE].

Aortic Atherosclerosis ([WE]-[WE]) and Emphysema ([WE]-[WE]).

## 2020-09-17 IMAGING — CT CT ABD-PELV W/ CM
2 of 5 series · 13 of 46 positions shown, 15 images · IV contrast (omnipaque)
Comparison: CT [DATE], [DATE]

CLINICAL DATA: Hemoptysis, shortness of breath, abdominal
distension

EXAM:
CT ANGIOGRAPHY CHEST
CT ABDOMEN AND PELVIS WITH CONTRAST
TECHNIQUE: Multidetector CT imaging of the chest was performed using the
standard protocol during bolus administration of intravenous
contrast. Multiplanar CT image reconstructions and MIPs were
obtained to evaluate the vascular anatomy. Multidetector CT imaging
of the abdomen and pelvis was performed using the standard protocol
during bolus administration of intravenous contrast.
CONTRAST:  75mL OMNIPAQUE IOHEXOL 350 MG/ML SOLN

[Series 2: axial st · axial · 0.73mm/px · z∈[-690,-290]mm · 10 of 92 slices shown, 12 images]
[im 6/92  soft-tissue]
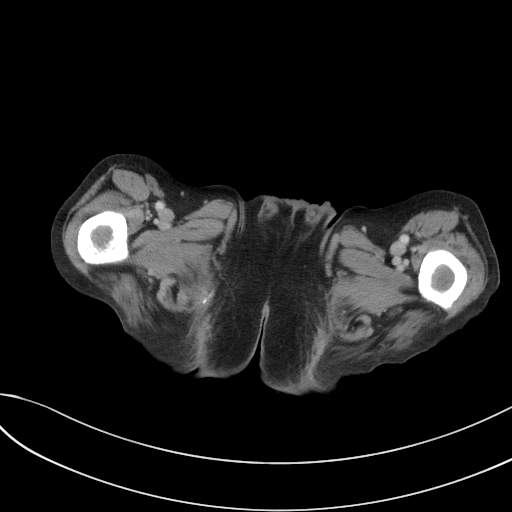
[im 6/92  bone]
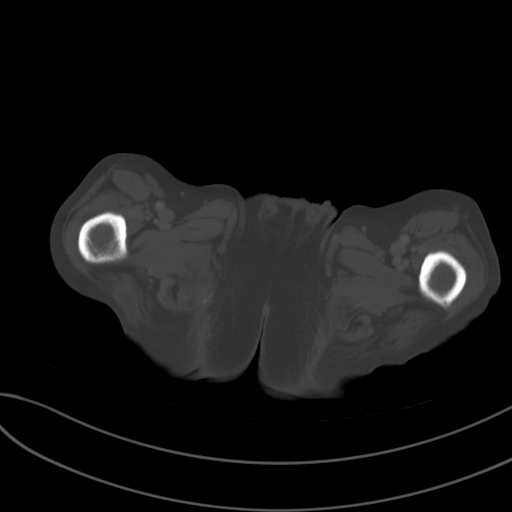
[im 17/92  soft-tissue]
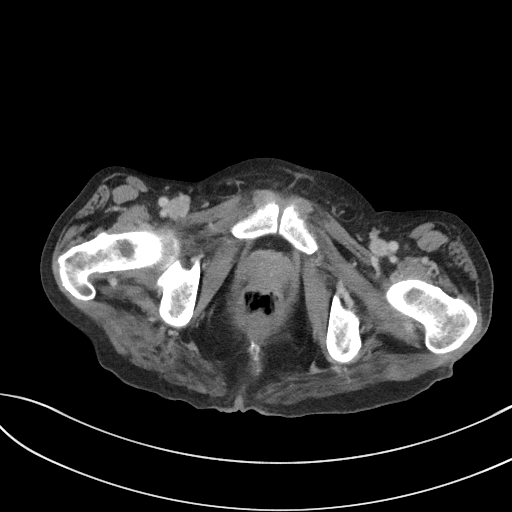
[im 27/92  soft-tissue]
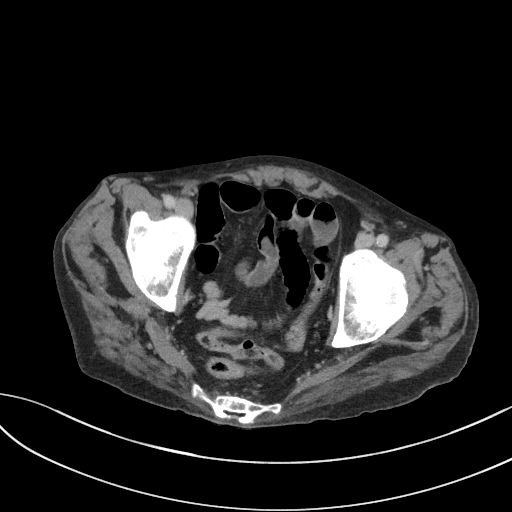
[im 33/92  soft-tissue]
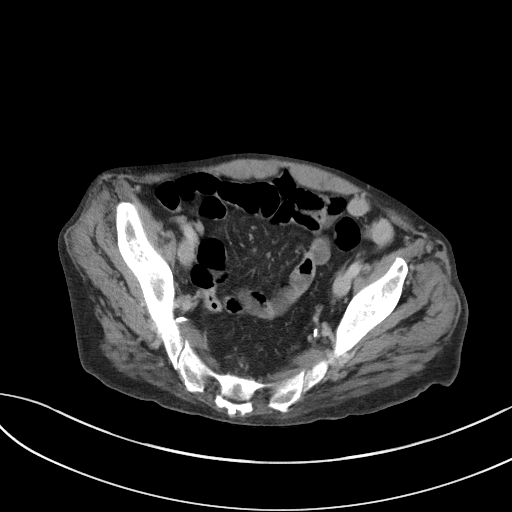
[im 43/92  soft-tissue]
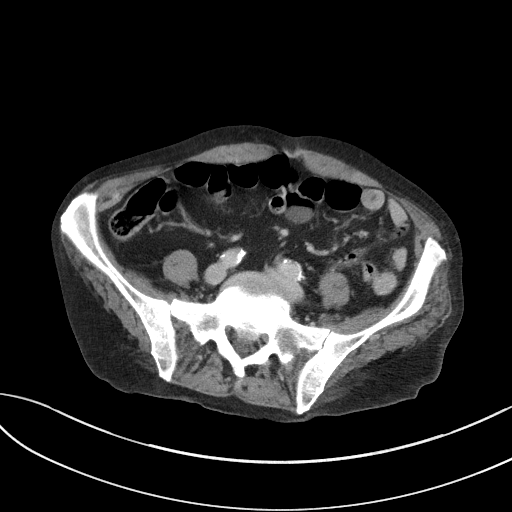
[im 49/92  soft-tissue]
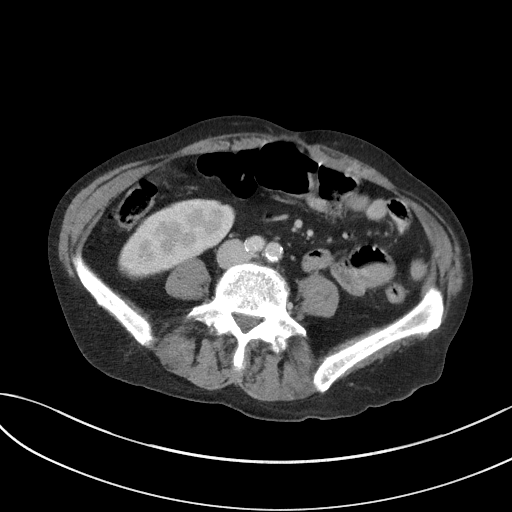
[im 59/92  soft-tissue]
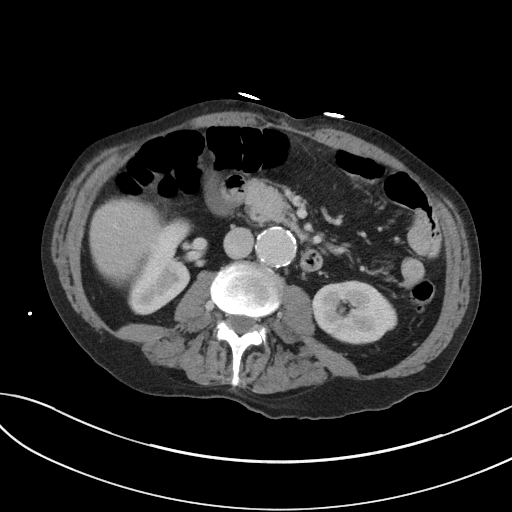
[im 70/92  soft-tissue]
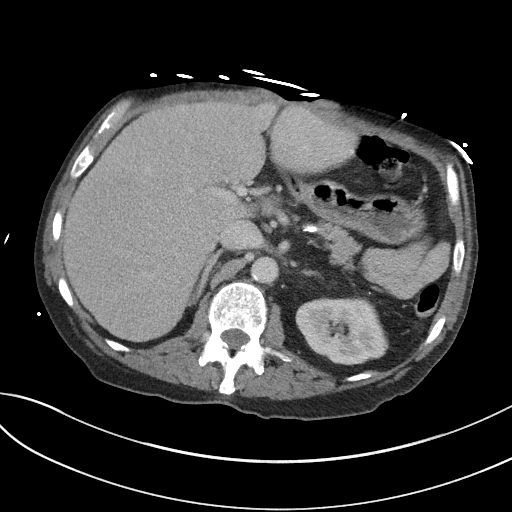
[im 75/92  soft-tissue]
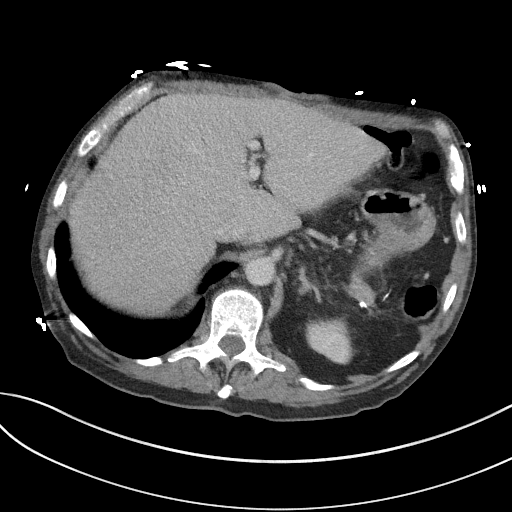
[im 75/92  bone]
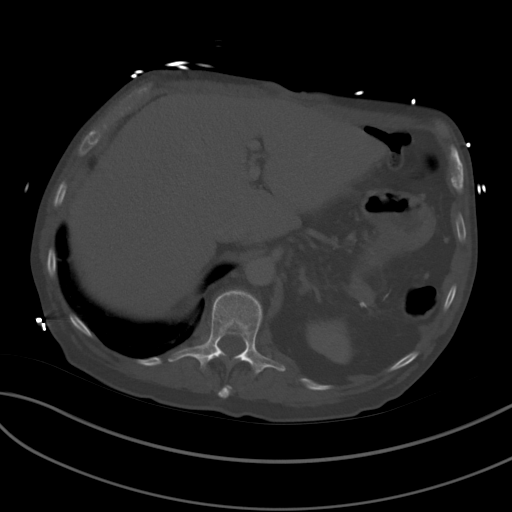
[im 86/92  soft-tissue]
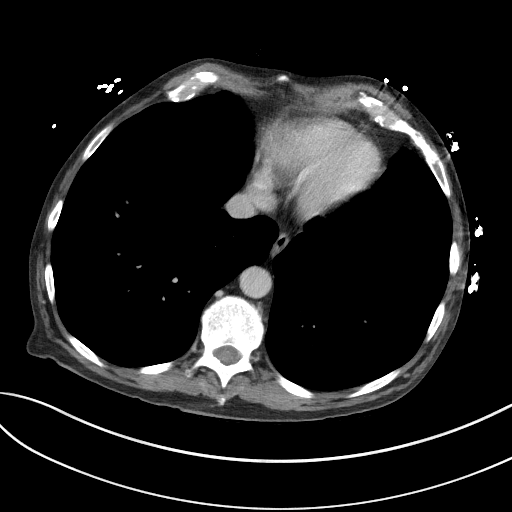

[Series 5: coronal st · coronal · 0.71mm/px · 3 of 90 slices shown]
[im 30/90  soft-tissue]
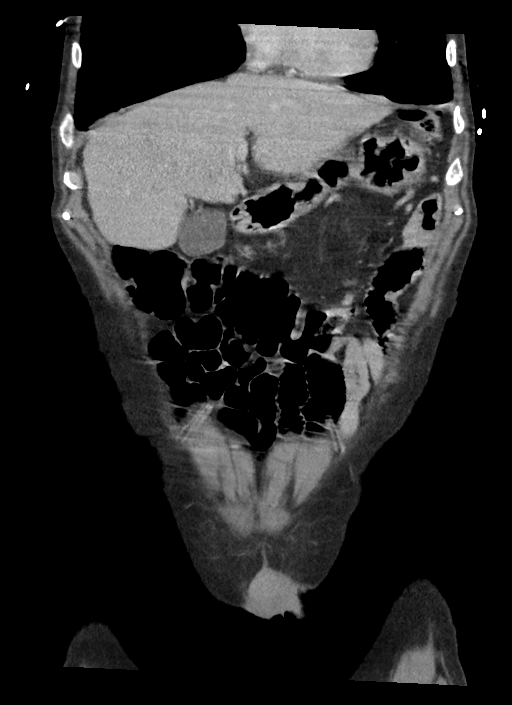
[im 40/90  soft-tissue]
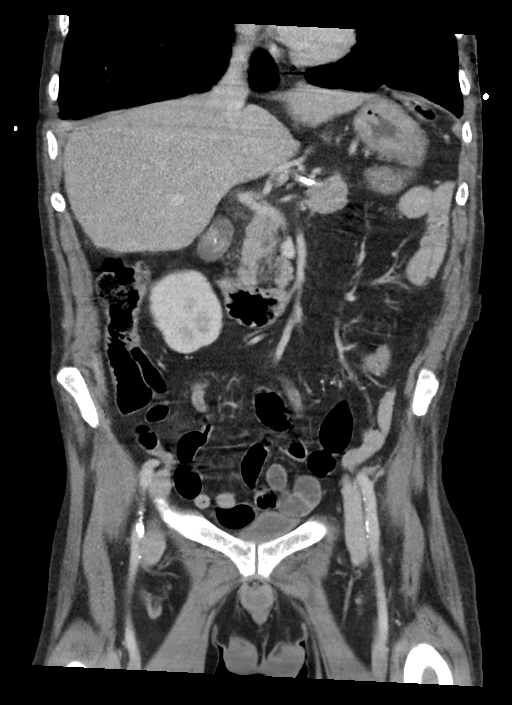
[im 50/90  soft-tissue]
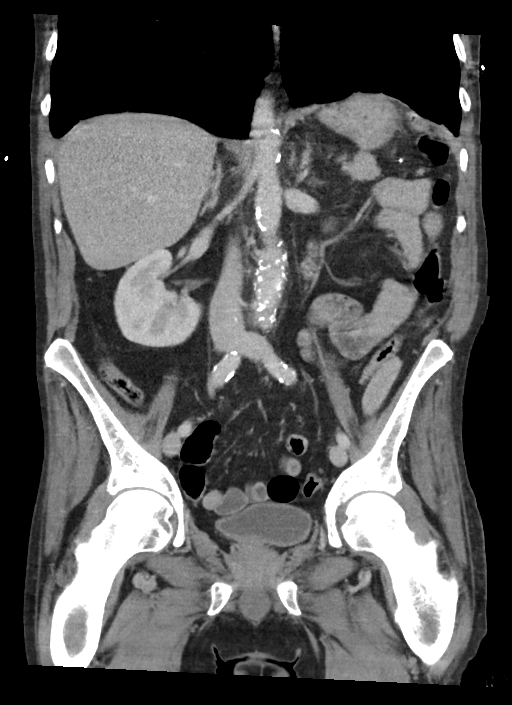

[13 of 46 positions shown; findings below may reference images not displayed]

FINDINGS: CTA CHEST FINDINGS

Cardiovascular: Satisfactory opacification of the pulmonary arteries
to the segmental level. No evidence of pulmonary embolism. Thoracic
aorta is normal in course and caliber. Atherosclerotic
calcifications of the aorta and coronary arteries. Normal heart
size. No pericardial effusion.

Mediastinum/Nodes: Mildly prominent mediastinal lymph nodes are
again noted, likely reactive. No axillary or hilar lymphadenopathy.

Lungs/Pleura: Redemonstrated cavitary mass located within the
inferomedial aspect of the right upper lobe measuring approximately
9.9 x 6.6 x 7.4 cm (previously measured approximately 7.8 x 4.5 x
5.5 cm on [DATE]. Increased surrounding consolidation and
ground-glass opacity within the adjacent right upper lobe and right
middle lobe previously seen cavitary lesion within the medial aspect
of the left upper lobe has resolved with small thin walled pleural
cyst remaining (series 6, image 44). Scattered areas of ground-glass
opacity within the bilateral lower lobes and upper lobes. Background
of moderate emphysema. No pleural effusion or pneumothorax.

Musculoskeletal: Remote right sixth and seventh rib fractures. No
new or acute osseous findings. No chest wall abnormality.

Review of the MIP images confirms the above findings.

CT ABDOMEN and PELVIS FINDINGS

Hepatobiliary: Cholelithiasis without evidence of pericholecystic
inflammation by CT. Liver within normal limits. No biliary
dilatation.

Pancreas: Unremarkable. No pancreatic ductal dilatation or
surrounding inflammatory changes.

Spleen: Prior splenectomy.

Adrenals/Urinary Tract: Unremarkable adrenal glands. Kidneys enhance
symmetrically without focal lesion, stone, or hydronephrosis.
Ureters are nondilated. Urinary bladder appears unremarkable.

Stomach/Bowel: Stomach is within normal limits. Appendix appears
normal (series 2, image 58). Incidental note of a small bowel-small
bowel intussusception within the mid left abdomen (series 2, image
38). No evidence of bowel wall thickening, distention, or
inflammatory changes.

Vascular/Lymphatic: Fusiform infrarenal abdominal aortic aneurysm
measuring up to 3.1 cm, unchanged. Aortoiliac atherosclerosis. No
abdominopelvic lymphadenopathy.

Reproductive: Prostate is unremarkable.

Other: No free fluid. No abdominopelvic fluid collection. No
pneumoperitoneum. Fat containing midline supraumbilical hernia
without evidence of complication.

Musculoskeletal: No acute or significant osseous findings.

Review of the MIP images confirms the above findings.
IMPRESSION: 1. No evidence of acute pulmonary embolism.
2. Enlarging cavitary mass within the inferomedial aspect of the
right upper lobe measuring up to 9.9 cm (previously measured
approximately 7.8 x 4.5 x 5.5 cm on [DATE]). Increased
surrounding consolidation and ground-glass opacity within the
adjacent right upper lobe and right middle lobe. Findings remain
most compatible with cavitary/necrotizing pneumonia.
3. Scattered areas of ground-glass opacity within the bilateral
lower lobes and upper lobes, concerning for multifocal infection.
4. Interval resolution of previously seen cavitary lesion within the
medial aspect of the left upper lobe with small thin walled pleural
cyst remaining.
5. Cholelithiasis without evidence of pericholecystic inflammation
by CT.
6. Incidental note of a small bowel-small bowel intussusception
within the mid left abdomen. No evidence of bowel obstruction.
7. Fat containing midline supraumbilical hernia without evidence of
complication.
8. Stable infrarenal abdominal aortic aneurysm measuring up to
cm. Recommend follow-up ultrasound every 3 years. This
recommendation follows ACR consensus guidelines: White Paper of the
ACR Incidental Findings Committee II on Vascular Findings. [HOSPITAL] [WE]; [DATE].

Aortic Atherosclerosis ([WE]-[WE]) and Emphysema ([WE]-[WE]).

## 2020-09-17 IMAGING — DX DG CHEST 1V PORT
2 series · 2 of 2 positions shown · non-contrast
Comparison: [DATE] chest radiograph and chest CT [DATE]

CLINICAL DATA: Shortness of breath and hemoptysis

EXAM:
PORTABLE CHEST 1 VIEW

[chest ap (1 of 2)]
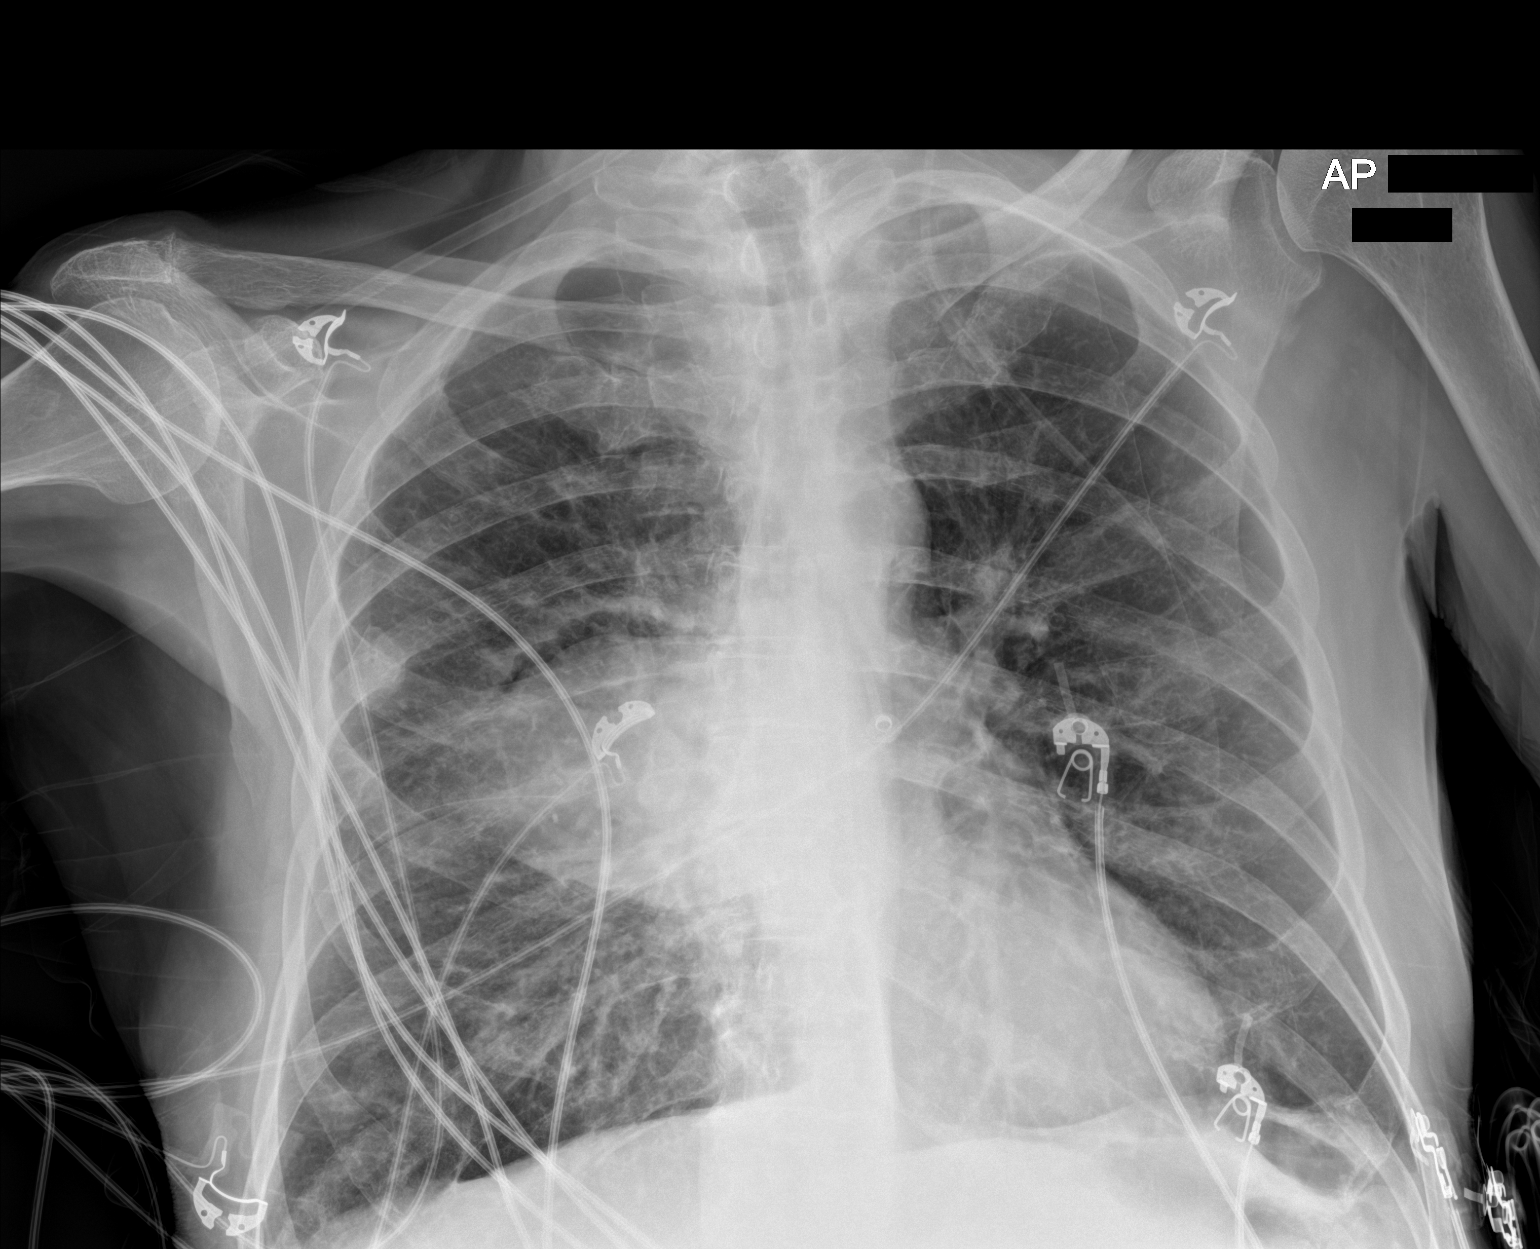

[chest ap (2 of 2)]
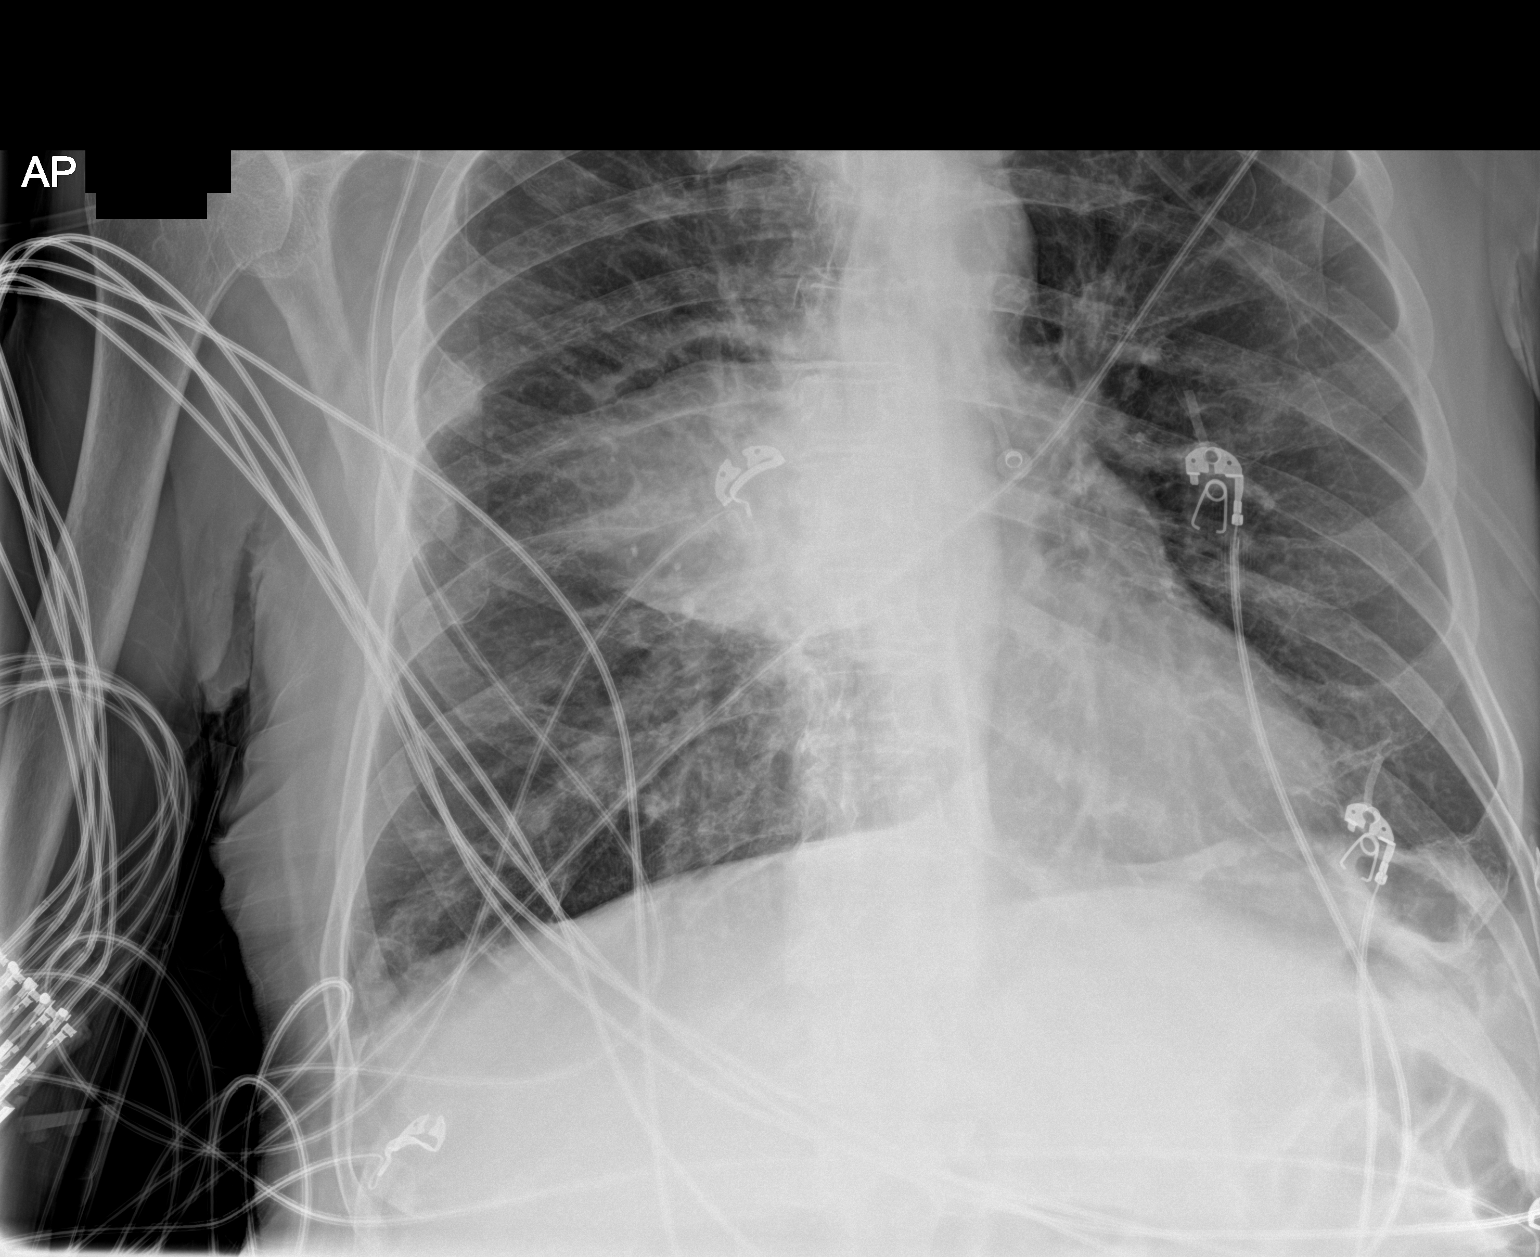

[2 of 2 positions shown; findings below may reference images not displayed]

FINDINGS: The previously noted cavitary mass in the right middle lobe
anteriorly is again noted. There is increase in consolidation in
this area compared to most recent study. Lungs elsewhere clear.
Heart size and pulmonary vascular normal. No adenopathy appreciable.
No bone lesions.
IMPRESSION: Cavitary mass right middle lobe anteriorly with increase in
surrounding consolidation compared to most recent study. The lungs
elsewhere are clear. Heart size normal. No adenopathy appreciable by
radiography.

## 2020-09-17 MED ORDER — SODIUM CHLORIDE 0.9 % IV BOLUS
1000.0000 mL | Freq: Once | INTRAVENOUS | Status: AC
  Administered 2020-09-17: 1000 mL via INTRAVENOUS

## 2020-09-17 MED ORDER — METHYLPREDNISOLONE SODIUM SUCC 125 MG IJ SOLR
125.0000 mg | Freq: Once | INTRAMUSCULAR | Status: AC
  Administered 2020-09-17: 125 mg via INTRAVENOUS
  Filled 2020-09-17: qty 2

## 2020-09-17 MED ORDER — PIPERACILLIN-TAZOBACTAM 3.375 G IVPB
3.3750 g | Freq: Three times a day (TID) | INTRAVENOUS | Status: DC
  Administered 2020-09-17 – 2020-09-18 (×2): 3.375 g via INTRAVENOUS
  Filled 2020-09-17 (×3): qty 50

## 2020-09-17 MED ORDER — CEFEPIME HCL 2 G IJ SOLR
2.0000 g | Freq: Once | INTRAMUSCULAR | Status: AC
  Administered 2020-09-17: 2 g via INTRAVENOUS
  Filled 2020-09-17: qty 2

## 2020-09-17 MED ORDER — PIPERACILLIN-TAZOBACTAM 3.375 G IVPB 30 MIN: 3.3750 g | Freq: Four times a day (QID) | INTRAVENOUS | Status: DC

## 2020-09-17 MED ORDER — IOHEXOL 350 MG/ML SOLN
75.0000 mL | Freq: Once | INTRAVENOUS | Status: AC | PRN
  Administered 2020-09-17: 75 mL via INTRAVENOUS

## 2020-09-17 MED ORDER — HEPARIN SODIUM (PORCINE) 5000 UNIT/ML IJ SOLN
5000.0000 [IU] | Freq: Three times a day (TID) | INTRAMUSCULAR | Status: DC
  Administered 2020-09-17 – 2020-09-19 (×4): 5000 [IU] via SUBCUTANEOUS
  Filled 2020-09-17 (×6): qty 1

## 2020-09-17 MED ORDER — PREDNISONE 10 MG PO TABS
10.0000 mg | ORAL_TABLET | Freq: Two times a day (BID) | ORAL | Status: DC
  Administered 2020-09-18 – 2020-09-28 (×21): 10 mg via ORAL
  Filled 2020-09-17 (×21): qty 1

## 2020-09-17 MED ORDER — ACETAMINOPHEN 650 MG RE SUPP: 650.0000 mg | Freq: Four times a day (QID) | RECTAL | Status: DC | PRN

## 2020-09-17 MED ORDER — SODIUM CHLORIDE 0.9 % IV SOLN: Freq: Once | INTRAVENOUS | Status: AC

## 2020-09-17 MED ORDER — ACETAMINOPHEN 325 MG PO TABS
650.0000 mg | ORAL_TABLET | Freq: Four times a day (QID) | ORAL | Status: DC | PRN
  Administered 2020-09-21: 650 mg via ORAL
  Filled 2020-09-17: qty 2

## 2020-09-17 MED ORDER — VANCOMYCIN HCL IN DEXTROSE 1-5 GM/200ML-% IV SOLN
1000.0000 mg | Freq: Once | INTRAVENOUS | Status: AC
  Administered 2020-09-17: 1000 mg via INTRAVENOUS
  Filled 2020-09-17: qty 200

## 2020-09-17 MED ORDER — POTASSIUM CHLORIDE 10 MEQ/100ML IV SOLN
10.0000 meq | INTRAVENOUS | Status: AC
  Administered 2020-09-17 (×2): 10 meq via INTRAVENOUS
  Filled 2020-09-17 (×2): qty 100

## 2020-09-17 MED ORDER — METRONIDAZOLE 500 MG/100ML IV SOLN
500.0000 mg | Freq: Once | INTRAVENOUS | Status: AC
  Administered 2020-09-17: 500 mg via INTRAVENOUS
  Filled 2020-09-17: qty 100

## 2020-09-17 MED ORDER — IPRATROPIUM-ALBUTEROL 0.5-2.5 (3) MG/3ML IN SOLN
3.0000 mL | Freq: Once | RESPIRATORY_TRACT | Status: AC
  Administered 2020-09-17: 3 mL via RESPIRATORY_TRACT
  Filled 2020-09-17: qty 3

## 2020-09-17 MED ORDER — VANCOMYCIN HCL 500 MG/100ML IV SOLN
500.0000 mg | Freq: Once | INTRAVENOUS | Status: AC
  Administered 2020-09-17: 500 mg via INTRAVENOUS
  Filled 2020-09-17: qty 100

## 2020-09-17 MED ORDER — VANCOMYCIN HCL 750 MG/150ML IV SOLN
750.0000 mg | Freq: Two times a day (BID) | INTRAVENOUS | Status: DC
  Administered 2020-09-18 (×2): 750 mg via INTRAVENOUS
  Filled 2020-09-17 (×4): qty 150

## 2020-09-17 MED ORDER — PANTOPRAZOLE SODIUM 40 MG IV SOLR
40.0000 mg | INTRAVENOUS | Status: DC
  Administered 2020-09-17: 40 mg via INTRAVENOUS
  Filled 2020-09-17: qty 40

## 2020-09-17 MED ORDER — NICOTINE 21 MG/24HR TD PT24
21.0000 mg | MEDICATED_PATCH | Freq: Every day | TRANSDERMAL | Status: DC
  Administered 2020-09-17 – 2020-09-28 (×12): 21 mg via TRANSDERMAL
  Filled 2020-09-17 (×12): qty 1

## 2020-09-17 NOTE — ED Provider Notes (Signed)
[facility] Emergency Department Provider Note  ____________________________________________   Event Date/Time   First MD Initiated Contact with Patient 09/17/20 1334     (approximate)  I have reviewed the triage vital signs and the nursing notes.   HISTORY  Chief Complaint Shortness of Breath (Coughing up blood , sob, hernia abdominal , )    HPI Marcus [name] is a 60 y.o. male with COPD, chronic hypoxic respiratory failure on 2 L, rheumatoid arthritis who comes in with concerns for vomiting blood.  Patient had multiple admissions for COPD.  On review of records on 08/22/2020 he had a CT scan showing a cavitary lesion of right upper lobe concerning for lung abscess.  He was placed on Zosyn and vancomycin.  Patient was found to have positive MRSA sputum cultures.  Patient was treated with 6 weeks of doxycycline oral.  Patient came back to the emergency room on 6/2 for fever and shortness of breath.  Patient felt better after breathing treatments and was discharged on prednisone and doxycycline.  He states he has been compliant with his medications.  Patient reports that he has been coughing up blood.  Denies any vomiting or dark stools.  He states that he has been more short of breath.  When EMS got there he was having increased work of breathing, tripoding with saturations in the low 80s.  Patient was given a breathing treatment placed on nonrebreather.  Here patient's working to breathe.  States he has been short of breath for the past 2 days This is constant, severe, nothing makes it better, worse with exertionand has had some coughing up blood.  She also reports worsening abdominal pain and worsening hernia in his abdomen.            Past Medical History:  Diagnosis Date   COPD (chronic obstructive pulmonary disease) (HCC)    Felty syndrome (HCC)    Hernia, epigastric    Pancytopenia (HCC)    Seropositive rheumatoid arthritis ([place])    Tobacco use  disorder     Patient Active Problem List   Diagnosis Date Noted   Acquired neutropenia ([place]) 08/28/2020   Hypothermia 08/24/2020   Cavitary pneumonia 08/22/2020   Acute on chronic respiratory failure with hypoxia ([place]) 08/22/2020   Hyponatremia 08/04/2020   Generalized weakness 08/04/2020   Dehydration 08/03/2020   Septic shock ([place]) 06/29/2020   CAP (community acquired pneumonia) 06/29/2020   COPD exacerbation ([place]) 06/11/2020   Chest pain 06/11/2020   Cellulitis 06/11/2020   Chronic diastolic CHF (congestive heart failure) ([place]) 06/11/2020   Severe sepsis with septic shock ([place]) 01/20/2020   Acute hypoxemic respiratory failure ([place]) 01/20/2020   Hypokalemia 01/20/2020   Perforated prepyloric ulcer 01/18/2020 01/18/2020   Pneumoperitoneum 01/17/2020   COPD with acute exacerbation ([place]) 08/18/2019   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   Anemia 11/14/2015   Seropositive rheumatoid arthritis (HCC)    Chronic obstructive pulmonary disease ([place])    Moderate protein-calorie malnutrition ([place]) 07/24/2015   Pancytopenia ([place]) 07/23/2015   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[place]) 06/06/2013   Leukopenia 10/21/2012   Axillary abscess 10/21/2012   Abnormal EKG 10/21/2012   Joint pain 10/21/2012   Splenomegaly 10/21/2012    Past Surgical History:  Procedure Laterality Date   INCISIONAL HERNIA REPAIR  01/17/2020   Procedure: HERNIA REPAIR INCISIONAL AND Silvestre Gunner;  Surgeon: Kinsinger, Arta Bruce, MD;  Location: [facility] ORS;  Service: General;;   LAPAROTOMY N/A 01/17/2020   Procedure: EXPLORATORY LAPAROTOMY;  Surgeon: Mickeal Skinner,  MD;  Location: [facility] ORS;  Service: General;  Laterality: N/A;    Prior to Admission medications   Medication Sig Start Date End Date Taking? Authorizing Provider  albuterol (PROVENTIL) (2.5 MG/3ML) 0.083% nebulizer solution Take 3 mLs (2.5 mg total) by nebulization every 6 (six) hours as needed for wheezing or shortness of breath. 08/30/20   Kathie Dike, MD  albuterol  (VENTOLIN HFA) 108 (90 Base) MCG/ACT inhaler Inhale 2 puffs into the lungs every 6 (six) hours as needed for wheezing or shortness of breath. 08/30/20   Kathie Dike, MD  doxycycline (VIBRA-TABS) 100 MG tablet Take 1 tablet (100 mg total) by mouth every 12 (twelve) hours. 08/30/20   Kathie Dike, MD  feeding supplement (ENSURE ENLIVE / ENSURE PLUS) LIQD Take 237 mLs by mouth 2 (two) times daily between meals. 08/04/20   Manuella Ghazi, Pratik D, DO  guaiFENesin-dextromethorphan (ROBITUSSIN DM) 100-10 MG/5ML syrup Take 5 mLs by mouth every 4 (four) hours as needed for cough. Patient not taking: Reported on 08/03/2020 07/01/20   Heath Lark D, DO  mometasone-formoterol (DULERA) 100-5 MCG/ACT AERO Inhale 2 puffs into the lungs 2 (two) times daily. 08/30/20   Kathie Dike, MD  pantoprazole (PROTONIX) 40 MG tablet Take 1 tablet (40 mg total) by mouth daily for 10 days. 07/02/20 07/12/20  Manuella Ghazi, Pratik D, DO  predniSONE (DELTASONE) 10 MG tablet Take 1 tablet (10 mg total) by mouth 2 (two) times daily with a meal. 08/30/20   Kathie Dike, MD    Allergies Levaquin [levofloxacin in d5w] and Methotrexate  Family History  Problem Relation Age of Onset   CAD Brother    COPD Brother     Social History Social History   Tobacco Use   Smoking status: Every Day    Packs/day: 1.00    Pack years: 0.00    Types: Cigarettes   Smokeless tobacco: Never  Substance Use Topics   Alcohol use: No    Alcohol/week: 0.0 standard drinks   Drug use: Yes    Types: Marijuana      Review of Systems Constitutional: No fever/chills Eyes: No visual changes. ENT: No sore throat. Cardiovascular: No chest pain Respiratory: Positive for SOB, coughing up blood  Gastrointestinal: Positive abdominal pain no nausea, no vomiting.  No diarrhea.  No constipation. Genitourinary: Negative for dysuria. Musculoskeletal: Negative for back pain. Skin: Negative for rash. Neurological: Negative for headaches, focal weakness or  numbness. All other ROS negative ____________________________________________   PHYSICAL EXAM:  VITAL SIGNS: Blood pressure 106/67, pulse 100, temperature 98.8 F (37.1 C), temperature source Oral, resp. rate 17, SpO2 95 %.  Constitutional: Alert and oriented.  Mild distress Eyes: Conjunctivae are normal. EOMI. Head: Atraumatic. Nose: No congestion/rhinnorhea. Mouth/Throat: Mucous membranes are moist.   Neck: No stridor. Trachea Midline. FROM Cardiovascular: Tachycardic, regular rhythm. Grossly normal heart sounds.  Good peripheral circulation. Respiratory: increased WOB, wheezing, course breath sounds  Gastrointestinal: Hernia noted when patient tries to sit up with some tenderness in the upper abdomen.. No distention. No abdominal bruits.  Musculoskeletal: No lower extremity tenderness nor edema.  No joint effusions. Neurologic:  Normal speech and language. No gross focal neurologic deficits are appreciated.  Skin:  Skin is warm, dry and intact. No rash noted. Psychiatric: Mood and affect are normal. Speech and behavior are normal. GU: Deferred   ____________________________________________   LABS (all labs ordered are listed, but only abnormal results are displayed)  Labs Reviewed  CBC WITH DIFFERENTIAL/PLATELET - Abnormal; Notable for the following components:  Result Value   WBC 3.1 (*)    Hemoglobin 12.9 (*)    RDW 18.5 (*)    Neutro Abs 1.5 (*)    All other components within normal limits  COMPREHENSIVE METABOLIC PANEL - Abnormal; Notable for the following components:   Sodium 131 (*)    Potassium 3.3 (*)    Chloride 96 (*)    Glucose, Bld 137 (*)    BUN 33 (*)    Calcium 7.9 (*)    Albumin 2.4 (*)    Alkaline Phosphatase 276 (*)    All other components within normal limits  APTT - Abnormal; Notable for the following components:   aPTT 45 (*)    All other components within normal limits  BLOOD GAS, VENOUS - Abnormal; Notable for the following components:    pO2, Ven 54.0 (*)    Bicarbonate 29.3 (*)    Acid-Base Excess 2.5 (*)    All other components within normal limits  RESP PANEL BY RT-PCR (FLU A&B, COVID) ARPGX2  CULTURE, BLOOD (ROUTINE X 2)  CULTURE, BLOOD (ROUTINE X 2)  LACTIC ACID, PLASMA  PROCALCITONIN  PROTIME-INR  BRAIN NATRIURETIC PEPTIDE  LACTIC ACID, PLASMA  TYPE AND SCREEN  TROPONIN I (HIGH SENSITIVITY)  TROPONIN I (HIGH SENSITIVITY)   ____________________________________________   ED ECG REPORT I, Vanessa [place], the attending physician, personally viewed and interpreted this ECG.  Sinus tachycardia rate of 106, no ST elevation, T wave inversions in V2, right bundle branch block with left anterior fascicular block  Looks similar to prior EKG ____________________________________________  RADIOLOGY I, Vanessa [place], personally viewed and evaluated these images (plain radiographs) as part of my medical decision making, as well as reviewing the written report by the radiologist.  ED MD interpretation:   Cavitary mass noted in the right middle lobe  Official radiology report(s): DG Chest Portable 1 View  Result Date: 09/17/2020 CLINICAL DATA:  Shortness of breath and hemoptysis EXAM: PORTABLE CHEST 1 VIEW COMPARISON:  September 06, 2020 chest radiograph and chest CT Aug 22, 2020 FINDINGS: The previously noted cavitary mass in the right middle lobe anteriorly is again noted. There is increase in consolidation in this area compared to most recent study. Lungs elsewhere clear. Heart size and pulmonary vascular normal. No adenopathy appreciable. No bone lesions. IMPRESSION: Cavitary mass right middle lobe anteriorly with increase in surrounding consolidation compared to most recent study. The lungs elsewhere are clear. Heart size normal. No adenopathy appreciable by radiography. Electronically Signed   By: Lowella Grip III M.D.   On: 09/17/2020 13:54    ____________________________________________   PROCEDURES  Procedure(s)  performed (including Critical Care):  .1-3 Lead EKG Interpretation  Date/Time: 09/17/2020 3:30 PM Performed by: Vanessa [place], MD Authorized by: Vanessa [place], MD     Interpretation: abnormal     ECG rate:  100s   ECG rate assessment: tachycardic     Rhythm: sinus rhythm     Ectopy: none     Conduction: normal   .Critical Care  Date/Time: 09/17/2020 3:30 PM Performed by: Vanessa [place], MD Authorized by: Vanessa [place], MD   Critical care provider statement:    Critical care time (minutes):  45   Critical care was necessary to treat or prevent imminent or life-threatening deterioration of the following conditions:  Respiratory failure   Critical care was time spent personally by me on the following activities:  Discussions with consultants, evaluation of patient's response to treatment, examination of patient,  ordering and performing treatments and interventions, ordering and review of laboratory studies, ordering and review of radiographic studies, pulse oximetry, re-evaluation of patient's condition, obtaining history from patient or surrogate and review of old charts   ____________________________________________   INITIAL IMPRESSION / ASSESSMENT AND PLAN / ED COURSE   CAMDYN BESKE was evaluated in Emergency Department on 09/17/2020 for the symptoms described in the history of present illness. He was evaluated in the context of the global COVID-19 pandemic, which necessitated consideration that the patient might be at risk for infection with the SARS-CoV-2 virus that causes COVID-19. Institutional protocols and algorithms that pertain to the evaluation of patients at risk for COVID-19 are in a state of rapid change based on information released by regulatory bodies including the CDC and federal and state organizations. These policies and algorithms were followed during the patient's care in the ED.     Pt presents with SOB.  Patient had some coarse breath sounds bilaterally little  bit of faint wheezing and patient was given duo nebs and steroids for COPD exacerbation.  However his VBG came back with no significant hypercapnia therefore patient was placed on high flow nasal cannula due to his hypoxia.  I suspect that this is most likely from patient's prior abscess.  He states that he has been compliant with his medications.  X-ray does confirm that the abscess is getting larger.  We will get a CT PE to further evaluate this abscess to see if there is anything that can be drained versus any other complications such as pulmonary embolism.  Patient also reports some abdominal pain so we will get CT abdomen to make sure no incarcerated hernia or obstruction.   Patient's white count is low but he is chronically low.  However with this and his heart rate and respiratory rate he met SIRS criteria given the concern for abscess sepsis alert was called and patient was started on broad-spectrum antibiotics to cover for this abscess.  Procalcitonin was elevated.    CT imaging shows enlarging cavitary mass with concerns for some multifocal infection as well.  Patient is already on broad-spectrum antibiotics.  There is also some incidental findings which I did discuss with patient including the follow-up for his aneurysm.  Will discuss with hospital team for admission      ____________________________________________   FINAL CLINICAL IMPRESSION(S) / ED DIAGNOSES   Final diagnoses:  Acute respiratory failure with hypoxia (HCC)  COPD exacerbation ([place])  Abscess  Sepsis, due to unspecified organism, unspecified whether acute organ dysfunction present [facility])     MEDICATIONS GIVEN DURING THIS VISIT:  Medications  vancomycin (VANCOCIN) IVPB 1000 mg/200 mL premix (1,000 mg Intravenous New Bag/Given 09/17/20 1628)  metroNIDAZOLE (FLAGYL) IVPB 500 mg (500 mg Intravenous New Bag/Given 09/17/20 1627)  sodium chloride 0.9 % bolus 1,000 mL (has no administration in time range)   ipratropium-albuterol (DUONEB) 0.5-2.5 (3) MG/3ML nebulizer solution 3 mL (3 mLs Nebulization Given 09/17/20 1349)  ipratropium-albuterol (DUONEB) 0.5-2.5 (3) MG/3ML nebulizer solution 3 mL (3 mLs Nebulization Given 09/17/20 1349)  ipratropium-albuterol (DUONEB) 0.5-2.5 (3) MG/3ML nebulizer solution 3 mL (3 mLs Nebulization Given 09/17/20 1349)  methylPREDNISolone sodium succinate (SOLU-MEDROL) 125 mg/2 mL injection 125 mg (125 mg Intravenous Given 09/17/20 1400)  ceFEPIme (MAXIPIME) 2 g in sodium chloride 0.9 % 100 mL IVPB (0 g Intravenous Stopped 09/17/20 1619)  iohexol (OMNIPAQUE) 350 MG/ML injection 75 mL (75 mLs Intravenous Contrast Given 09/17/20 1556)     ED Discharge Orders  None        Note:  This document was prepared using Dragon voice recognition software and may include unintentional dictation errors.   Vanessa [place], MD 09/17/20 [id number]

## 2020-09-17 NOTE — Consult Note (Signed)
Pharmacy Antibiotic Note  Marcus [name] is a 60 y.o. male admitted on 09/17/2020 with pneumonia. CT showed enlarging cavitary mass with surrounding consolidation most compatible with cavitary/necrotizing pneumonia and scatted areas of ground-glass opacity within the bilateral upper and lower lobes concerning for multifocal PNA. Pharmacy has been consulted for vancomycin dosing.  Plan: Vancomycin 1000 mg IV x 1 in ED, will order another 500 mg for a total loading dose of 1500 mg Followed by vancomycin 750 mg IV q12 hours  Est AUC: 462 Est Cmin: 12.8 Calculated using TBW, SCr 0.8, and Vd 0.72  Patient is also ordered Zosyn 3.375 g IV q6h  Temp (24hrs), Avg:98.8 F (37.1 C), Min:98.8 F (37.1 C), Max:98.8 F (37.1 C)  Recent Labs  Lab 09/17/20 1345 09/17/20 1550 09/17/20 2003  WBC 3.1*  --   --   CREATININE 0.78  --   --   LATICACIDVEN 1.9 2.4* 2.6*    Estimated Creatinine Clearance: 84.8 mL/min (by C-G formula based on SCr of 0.78 mg/dL).    Allergies  Allergen Reactions   Levaquin [Levofloxacin In D5w] Other (See Comments)    Chest pain   Methotrexate Other (See Comments)    Chest pain    Antimicrobials this admission: 6/13 Vancomycin >>  6/13 Zosyn >>  6/13 Cefepime x1 in ED 6/13 Flagyl x1 in ED  Dose adjustments this admission: N/A  Microbiology results: 6/13 BCx: pending 6/13 Sputum: pending 6/13 MRSA PCR: ordered   Thank you for allowing pharmacy to be a part of this patient's care.  Darnelle Bos, PharmD 09/17/2020 7:25 PM

## 2020-09-17 NOTE — Progress Notes (Signed)
CODE SEPSIS - PHARMACY COMMUNICATION  **Broad Spectrum Antibiotics should be administered within 1 hour of Sepsis diagnosis**  Time Code Sepsis Called/Page Received: 6/13 at 1510  Antibiotics Ordered: Cefepime and Vancomycin  Time of 1st antibiotic administration: 1534  Additional action taken by pharmacy:   If necessary, Name of Provider/Nurse Contacted:      Noralee Space ,PharmD Clinical Pharmacist  09/17/2020  3:57 PM

## 2020-09-17 NOTE — H&P (Addendum)
History and Physical    [name] VZD:[id number] DOB: 02-27-1961 DOA: 09/17/2020  PCP: [name], FNP    Patient coming from:  Home   Chief Complaint:  Hemoptysis, shortness of breath, abdominal pain   HPI:  Marcus [name] is a 60 y.o. male who presents to the ED with coughing up blood, shortness of breath, cough, and abdominal pain. Patient reports the coughing of blood began past 2 to 3 days . He has had multiple episodes. In addition he has gradually become more short of breath.  Patient is also having significant coughing with congestion, patient reports shortness of breath with this.  Patient reports abdominal pain along the incisional site of his previous surgery and reports that it is also progressing along with his symptoms of shortness of breath.  He denies nausea, vomiting, bloody stool, chest pain, palpitations, fevers/chills, or other symptoms. Patient's shortness of breath is alleviated by nothing and exacerbated by any exertion. He called EMS, who found the patient to be in respiratory distress with tachypnea and hypoxia with O2 saturations in the low 80s; he required a non-rebreather and was given a nebulization Treatment.  Patient states that he does have a pulmonologist but has not followed up with him yet it is a new appointment.   Medical conditions include COPD, chronic hypoxic respiratory failure on 2L [place] oxygen at home, Rheumatoid arthritis, Felty syndrome, pancytopenia, cavitary lung lesion, epigastric hernia, and tobacco use disorder.   Recent History:  Admission 08/22/2020 through 08/30/2020 also for same presentation for shortness of breath with 1 month of coughing; he was found to have a cavitary lesion in the right upper lobe by CT scan 08/22/2020 that was felt to be pneumonia and abscess.  The pneumonia was due to MRSA, which was found on sputum culture.  Patient was initially treated with IV Zosyn and IV vancomycin.  In consultation with infectious disease, the  patient was transitioned to oral doxycycline with plan of 4-6 weeks of treatment.  During hospitalization he had relative adrenal insufficiency so his prednisone was doubled.  Patient has chronic neutropenia and had worsening neutropenia during his hospitalization; he was seen by the oncologist who felt the neutropenia was secondary to infection; he was given Granix.  Patient required initially nonrebreather oxygen and was eventually weaned to nasal cannula and eventually back to his home 2 L.  He was noted to be prednisone dependent; his home prednisone dose was changed from 10 mg daily to 10 mg twice a day.   ED Course: Patient was tachypneic with respiratory rates up to 37; initially he was on 100% nonrebreather but eventually was weaned to 7 L [place] (home O2 is 2L). Labs include initial lactic acid 2.4 (repeat 2.6), WBC 3.1, hemoglobin 12.9, sodium 131, potassium 3.3, glucose 137, albumin 2.4, alk phos 276, PTT 45.  SARS-CoV-2 PCR, Flu A, and Flu B were negative. CTA chest and CT of the abdomen and pelvis revealed his known right upper lobe cavitary mass increased to 9.9 cm in greatest diameter (previously was 7.8 on 08/22/20) with findings likely representing cavitary/necrotizing pneumonia; other findings included bilateral groundglass opacities concerning for multifocal infection, resolution of a different cavitary lesion that was in the left upper lobe, a small bowel intussusception within the mid left abdomen without evidence of obstruction, stable 3.1 cm infrarenal abdominal aortic aneurysm, and fat-containing supraumbilical hernia. Blood cultures were drawn. Sepsis order set was initiated. Patient was given IV vancomycin, 2 g IV cefepime, metronizazole 500 mg  IV, methylprednisolone 125 mg IV, Duonebs, and 2L of NS IVF.   Review of Systems:  Review of Systems  Constitutional:  Positive for malaise/fatigue.  HENT:  Positive for congestion.   Respiratory:  Positive for cough and hemoptysis.    Gastrointestinal:  Positive for abdominal pain.  All other systems reviewed and are negative.   Past Medical History:  Diagnosis Date   COPD (chronic obstructive pulmonary disease) (HCC)    Felty syndrome (HCC)    Hernia, epigastric    Pancytopenia (HCC)    Seropositive rheumatoid arthritis ([place])    Tobacco use disorder     Past Surgical History:  Procedure Laterality Date   INCISIONAL HERNIA REPAIR  01/17/2020   Procedure: HERNIA REPAIR INCISIONAL AND Silvestre Gunner;  Surgeon: Kinsinger, Arta Bruce, MD;  Location: [facility];  Service: General;;   LAPAROTOMY N/A 01/17/2020   Procedure: EXPLORATORY LAPAROTOMY;  Surgeon: Mickeal Skinner, MD;  Location: [facility];  Service: General;  Laterality: N/A;     reports that he has been smoking cigarettes. He has been smoking an average of 1.00 packs per day. He has never used smokeless tobacco. He reports current drug use. Drug: Marijuana. He reports that he does not drink alcohol.  Allergies  Allergen Reactions   Levaquin [Levofloxacin In D5w] Other (See Comments)    Chest pain   Methotrexate Other (See Comments)    Chest pain    Family History  Problem Relation Age of Onset   CAD Brother    COPD Brother     Prior to Admission medications   Medication Sig Start Date End Date Taking? Authorizing Provider  albuterol (PROVENTIL) (2.5 MG/3ML) 0.083% nebulizer solution Take 3 mLs (2.5 mg total) by nebulization every 6 (six) hours as needed for wheezing or shortness of breath. 08/30/20   Kathie Dike, MD  albuterol (VENTOLIN HFA) 108 (90 Base) MCG/ACT inhaler Inhale 2 puffs into the lungs every 6 (six) hours as needed for wheezing or shortness of breath. 08/30/20   Kathie Dike, MD  doxycycline (VIBRA-TABS) 100 MG tablet Take 1 tablet (100 mg total) by mouth every 12 (twelve) hours. 08/30/20   Kathie Dike, MD  feeding supplement (ENSURE ENLIVE / ENSURE PLUS) LIQD Take 237 mLs by mouth 2 (two) times daily between meals. 08/04/20    Manuella Ghazi, Pratik D, DO  guaiFENesin-dextromethorphan (ROBITUSSIN DM) 100-10 MG/5ML syrup Take 5 mLs by mouth every 4 (four) hours as needed for cough. Patient not taking: Reported on 08/03/2020 07/01/20   Heath Lark D, DO  mometasone-formoterol (DULERA) 100-5 MCG/ACT AERO Inhale 2 puffs into the lungs 2 (two) times daily. 08/30/20   Kathie Dike, MD  pantoprazole (PROTONIX) 40 MG tablet Take 1 tablet (40 mg total) by mouth daily for 10 days. 07/02/20 07/12/20  Manuella Ghazi, Pratik D, DO  predniSONE (DELTASONE) 10 MG tablet Take 1 tablet (10 mg total) by mouth 2 (two) times daily with a meal. 08/30/20   Kathie Dike, MD    Physical Exam: Vitals:   09/17/20 1730 09/17/20 1800 09/17/20 1830 09/17/20 1900  BP: (!) 92/59 (!) 98/59 120/63 135/74  Pulse: (!) 102 93 (!) 111 (!) 115  Resp: 18 (!) 24 (!) 26 (!) 22  Temp:      TempSrc:      SpO2: 94% 99% 96% 95%   Physical Exam Vitals and nursing note reviewed.  Constitutional:      General: He is not in acute distress.    Appearance: He is ill-appearing. He is not  toxic-appearing or diaphoretic.  HENT:     Head: Normocephalic and atraumatic.     Right Ear: External ear normal.     Left Ear: External ear normal.     Nose: Nose normal.     Mouth/Throat:     Mouth: Mucous membranes are moist.  Eyes:     Extraocular Movements: Extraocular movements intact.     Pupils: Pupils are equal, round, and reactive to light.  Neck:     Vascular: No carotid bruit.  Cardiovascular:     Rate and Rhythm: Normal rate and regular rhythm.     Pulses: Normal pulses.     Heart sounds: Normal heart sounds.  Pulmonary:     Effort: Pulmonary effort is normal.     Breath sounds: Wheezing present.  Abdominal:     General: Bowel sounds are normal. There is no distension.     Palpations: Abdomen is soft. There is no mass.     Tenderness: There is no abdominal tenderness. There is no guarding.     Hernia: No hernia is present.  Musculoskeletal:     Right lower leg: No  edema.     Left lower leg: No edema.  Skin:    General: Skin is warm.     Findings: No lesion or rash.  Neurological:     General: No focal deficit present.     Mental Status: He is alert and oriented to person, place, and time. Mental status is at baseline.  Psychiatric:        Mood and Affect: Mood normal.        Behavior: Behavior normal.    Labs on Admission: I have personally reviewed following labs and imaging studies  No results for input(s): CKTOTAL, CKMB, TROPONINI in the last 72 hours. Lab Results  Component Value Date   WBC 3.1 (L) 09/17/2020   HGB 12.9 (L) 09/17/2020   HCT 40.2 09/17/2020   MCV 84.1 09/17/2020   PLT 190 09/17/2020    Recent Labs  Lab 09/17/20 1345  NA 131*  K 3.3*  CL 96*  CO2 28  BUN 33*  CREATININE 0.78  CALCIUM 7.9*  PROT 6.7  BILITOT 1.0  ALKPHOS 276*  ALT 12  AST 21  GLUCOSE 137*   Lab Results  Component Value Date   TRIG 142 01/17/2020   Lab Results  Component Value Date   DDIMER 3.69 (H) 01/17/2020   Invalid input(s): POCBNP  Urinalysis    Component Value Date/Time   COLORURINE AMBER (A) 08/22/2020 1253   APPEARANCEUR HAZY (A) 08/22/2020 1253   LABSPEC 1.024 08/22/2020 1253   PHURINE 5.0 08/22/2020 [place] 08/22/2020 [place] 08/22/2020 [place] 08/22/2020 [place] 08/22/2020 1253   PROTEINUR NEGATIVE 08/22/2020 1253   UROBILINOGEN 0.2 10/21/2012 1737   NITRITE NEGATIVE 08/22/2020 1253   LEUKOCYTESUR NEGATIVE 08/22/2020 1253   COVID-19 Labs No results for input(s): DDIMER, FERRITIN, LDH, CRP in the last 72 hours. Lab Results  Component Value Date   SARSCOV2NAA NEGATIVE 09/17/2020   [place] NEGATIVE 09/06/2020   [place] NEGATIVE 08/28/2020   [place] NEGATIVE 08/22/2020    Radiological Exams on Admission: CT Angio Chest PE W and/or Wo Contrast  Result Date: 09/17/2020 CLINICAL DATA:  Hemoptysis, shortness of breath, abdominal  distension EXAM: CT ANGIOGRAPHY CHEST CT ABDOMEN AND PELVIS WITH CONTRAST TECHNIQUE: Multidetector CT imaging of the chest was performed using the standard protocol during bolus  administration of intravenous contrast. Multiplanar CT image reconstructions and MIPs were obtained to evaluate the vascular anatomy. Multidetector CT imaging of the abdomen and pelvis was performed using the standard protocol during bolus administration of intravenous contrast. CONTRAST:  62m OMNIPAQUE IOHEXOL 350 MG/ML SOLN COMPARISON:  CT 06/20/2020, 06/22/2020 FINDINGS: CTA CHEST FINDINGS Cardiovascular: Satisfactory opacification of the pulmonary arteries to the segmental level. No evidence of pulmonary embolism. Thoracic aorta is normal in course and caliber. Atherosclerotic calcifications of the aorta and coronary arteries. Normal heart size. No pericardial effusion. Mediastinum/Nodes: Mildly prominent mediastinal lymph nodes are again noted, likely reactive. No axillary or hilar lymphadenopathy. Lungs/Pleura: Redemonstrated cavitary mass located within the inferomedial aspect of the right upper lobe measuring approximately 9.9 x 6.6 x 7.4 cm (previously measured approximately 7.8 x 4.5 x 5.5 cm on 08/22/2020. Increased surrounding consolidation and ground-glass opacity within the adjacent right upper lobe and right middle lobe previously seen cavitary lesion within the medial aspect of the left upper lobe has resolved with small thin walled pleural cyst remaining (series 6, image 44). Scattered areas of ground-glass opacity within the bilateral lower lobes and upper lobes. Background of moderate emphysema. No pleural effusion or pneumothorax. Musculoskeletal: Remote right sixth and seventh rib fractures. No new or acute osseous findings. No chest wall abnormality. Review of the MIP images confirms the above findings. CT ABDOMEN and PELVIS FINDINGS Hepatobiliary: Cholelithiasis without evidence of pericholecystic inflammation by CT.  Liver within normal limits. No biliary dilatation. Pancreas: Unremarkable. No pancreatic ductal dilatation or surrounding inflammatory changes. Spleen: Prior splenectomy. Adrenals/Urinary Tract: Unremarkable adrenal glands. Kidneys enhance symmetrically without focal lesion, stone, or hydronephrosis. Ureters are nondilated. Urinary bladder appears unremarkable. Stomach/Bowel: Stomach is within normal limits. Appendix appears normal (series 2, image 58). Incidental note of a small bowel-small bowel intussusception within the mid left abdomen (series 2, image 38). No evidence of bowel wall thickening, distention, or inflammatory changes. Vascular/Lymphatic: Fusiform infrarenal abdominal aortic aneurysm measuring up to 3.1 cm, unchanged. Aortoiliac atherosclerosis. No abdominopelvic lymphadenopathy. Reproductive: Prostate is unremarkable. Other: No free fluid. No abdominopelvic fluid collection. No pneumoperitoneum. Fat containing midline supraumbilical hernia without evidence of complication. Musculoskeletal: No acute or significant osseous findings. Review of the MIP images confirms the above findings.   IMPRESSION:  1. No evidence of acute pulmonary embolism.  2. Enlarging cavitary mass within the inferomedial aspect of the right upper lobe measuring up to 9.9 cm (previously measured approximately 7.8 x 4.5 x 5.5 cm on 08/22/2020). Increased surrounding consolidation and ground-glass opacity within the adjacent right upper lobe and right middle lobe. Findings remain most compatible with cavitary/necrotizing pneumonia.  3. Scattered areas of ground-glass opacity within the bilateral lower lobes and upper lobes, concerning for multifocal infection.  4. Interval resolution of previously seen cavitary lesion within the medial aspect of the left upper lobe with small thin walled pleural cyst remaining.  5. Cholelithiasis without evidence of pericholecystic inflammation by CT.  6. Incidental note of a small  bowel-small bowel intussusception within the mid left abdomen. No evidence of bowel obstruction.  7. Fat containing midline supraumbilical hernia without evidence of complication.  8. Stable infrarenal abdominal aortic aneurysm measuring up to 3.1 cm. Recommend follow-up ultrasound every 3 years.   This recommendation follows ACR consensus guidelines: White Paper of the ACR Incidental Findings Committee II on Vascular Findings. J Am Coll Radiol 2013; 10:789-794. Aortic Atherosclerosis (ICD10-I70.0) and Emphysema (ICD10-J43.9). Electronically Signed   By: NDavina PokeD.O.   On: 09/17/2020 16:38  CT ABDOMEN PELVIS W CONTRAST Result Date: 09/17/2020 CLINICAL DATA:  Hemoptysis, shortness of breath, abdominal distension EXAM: CT ANGIOGRAPHY CHEST CT ABDOMEN AND PELVIS WITH CONTRAST TECHNIQUE: Multidetector CT imaging of the chest was performed using the standard protocol during bolus administration of intravenous contrast. Multiplanar CT image reconstructions and MIPs were obtained to evaluate the vascular anatomy. Multidetector CT imaging of the abdomen and pelvis was performed using the standard protocol during bolus administration of intravenous contrast. CONTRAST:  25m OMNIPAQUE IOHEXOL 350 MG/ML SOLN COMPARISON:  CT 06/20/2020, 06/22/2020 FINDINGS: CTA CHEST FINDINGS Cardiovascular: Satisfactory opacification of the pulmonary arteries to the segmental level. No evidence of pulmonary embolism. Thoracic aorta is normal in course and caliber. Atherosclerotic calcifications of the aorta and coronary arteries. Normal heart size. No pericardial effusion. Mediastinum/Nodes: Mildly prominent mediastinal lymph nodes are again noted, likely reactive. No axillary or hilar lymphadenopathy. Lungs/Pleura: Redemonstrated cavitary mass located within the inferomedial aspect of the right upper lobe measuring approximately 9.9 x 6.6 x 7.4 cm (previously measured approximately 7.8 x 4.5 x 5.5 cm on 08/22/2020. Increased  surrounding consolidation and ground-glass opacity within the adjacent right upper lobe and right middle lobe previously seen cavitary lesion within the medial aspect of the left upper lobe has resolved with small thin walled pleural cyst remaining (series 6, image 44). Scattered areas of ground-glass opacity within the bilateral lower lobes and upper lobes. Background of moderate emphysema. No pleural effusion or pneumothorax. Musculoskeletal: Remote right sixth and seventh rib fractures. No new or acute osseous findings. No chest wall abnormality. Review of the MIP images confirms the above findings. CT ABDOMEN and PELVIS FINDINGS Hepatobiliary: Cholelithiasis without evidence of pericholecystic inflammation by CT. Liver within normal limits. No biliary dilatation. Pancreas: Unremarkable. No pancreatic ductal dilatation or surrounding inflammatory changes. Spleen: Prior splenectomy. Adrenals/Urinary Tract: Unremarkable adrenal glands. Kidneys enhance symmetrically without focal lesion, stone, or hydronephrosis. Ureters are nondilated. Urinary bladder appears unremarkable. Stomach/Bowel: Stomach is within normal limits. Appendix appears normal (series 2, image 58). Incidental note of a small bowel-small bowel intussusception within the mid left abdomen (series 2, image 38). No evidence of bowel wall thickening, distention, or inflammatory changes. Vascular/Lymphatic: Fusiform infrarenal abdominal aortic aneurysm measuring up to 3.1 cm, unchanged. Aortoiliac atherosclerosis. No abdominopelvic lymphadenopathy. Reproductive: Prostate is unremarkable. Other: No free fluid. No abdominopelvic fluid collection. No pneumoperitoneum. Fat containing midline supraumbilical hernia without evidence of complication. Musculoskeletal: No acute or significant osseous findings. Review of the MIP images confirms the above findings. IMPRESSION: 1. No evidence of acute pulmonary embolism. 2. Enlarging cavitary mass within the  inferomedial aspect of the right upper lobe measuring up to 9.9 cm (previously measured approximately 7.8 x 4.5 x 5.5 cm on 08/22/2020). Increased surrounding consolidation and ground-glass opacity within the adjacent right upper lobe and right middle lobe. Findings remain most compatible with cavitary/necrotizing pneumonia. 3. Scattered areas of ground-glass opacity within the bilateral lower lobes and upper lobes, concerning for multifocal infection. 4. Interval resolution of previously seen cavitary lesion within the medial aspect of the left upper lobe with small thin walled pleural cyst remaining. 5. Cholelithiasis without evidence of pericholecystic inflammation by CT. 6. Incidental note of a small bowel-small bowel intussusception within the mid left abdomen. No evidence of bowel obstruction. 7. Fat containing midline supraumbilical hernia without evidence of complication. 8. Stable infrarenal abdominal aortic aneurysm measuring up to 3.1 cm. Recommend follow-up ultrasound every 3 years. This recommendation follows ACR consensus guidelines: White Paper of the ACR Incidental Findings Committee II on  Vascular Findings. J Am Coll Radiol 2013; 10:789-794. Aortic Atherosclerosis (ICD10-I70.0) and Emphysema (ICD10-J43.9). Electronically Signed   By: Davina Poke D.O.   On: 09/17/2020 16:38   DG Chest Portable 1 View Result Date: 09/17/2020 CLINICAL DATA:  Shortness of breath and hemoptysis EXAM: PORTABLE CHEST 1 VIEW COMPARISON:  September 06, 2020 chest radiograph and chest CT Aug 22, 2020 FINDINGS: The previously noted cavitary mass in the right middle lobe anteriorly is again noted. There is increase in consolidation in this area compared to most recent study. Lungs elsewhere clear. Heart size and pulmonary vascular normal. No adenopathy appreciable. No bone lesions. IMPRESSION: Cavitary mass right middle lobe anteriorly with increase in surrounding consolidation compared to most recent study. The lungs  elsewhere are clear. Heart size normal. No adenopathy appreciable by radiography. Electronically Signed   By: Lowella Grip III M.D.   On: 09/17/2020 13:54.    EKG: Independently reviewed.  Sinus tachycardia 106, right bundle branch block.  Echocardiogram 08/2020 :  1. Left ventricular ejection fraction, by estimation, is 55 to 60%. The  left ventricle has normal function. Left ventricular endocardial border  not optimally defined to evaluate regional wall motion. Left ventricular  diastolic parameters are consistent  with Grade I diastolic dysfunction (impaired relaxation).   2. Right ventricular systolic function is normal. The right ventricular  size is mildly enlarged. Mildly increased right ventricular wall  thickness. There is normal pulmonary artery systolic pressure.   3. The mitral valve is degenerative. Trivial mitral valve regurgitation.  No evidence of mitral stenosis.   4. The aortic valve was not well visualized. Aortic valve regurgitation  is not visualized. No aortic stenosis is present.   5. Pulmonic valve regurgitation not well assessed.   6. The inferior vena cava is normal in size with greater than 50%  respiratory variability, suggesting right atrial pressure of 3 mmHg.    Assessment/Plan Principal Problem:   Acute on chronic respiratory failure with hypoxia (HCC) Active Problems:   Hemoptysis   Chronic obstructive pulmonary disease (HCC)   Sepsis due to undetermined organism (HCC)   Cavitary pneumonia   Seropositive rheumatoid arthritis (HCC)   Hypokalemia   Chronic diastolic CHF (congestive heart failure) (HCC)   Acute on chronic respiratory failure with hypoxia: Patient admitted to progressive unit with continuous cardiac monitoring due to his acuity of illness and patient presenting with hypoxia and respiratory failures attributed to cavitary necrotizing pneumonia.  Patient is also on 2 L of oxygen at home, and currently is on 7 L, with O2 sats, SpO2:  95 % O2 Flow Rate (L/min): 7 L/min . We will continue patient on vancomycin for history of MRSA pneumonia, along with Zosyn for gram-positive gram-negative anaerobic coverage as well. We will make  patient n.p.o. and do swallow evaluation to identify the underlying etiology for this recurrence of not sure if this patient is aspirating due to difficulty swallowing or reflux.  She denies any alcohol abuse but does report smoking.  Will defer to a.m. team for consults with pulmonary or CT surgeon or IR.  Hemoptysis: Droplet precautions. Attributed to patient's necrotizing pneumonia. Pulmonology and ID consult per a.m. team.    COPD: Patient currently on 2 L of oxygen at home.   Stable, suspect patient's respiratory failure due to pneumonia, will continue patient's home regimen with prednisone and patient also received Solu-Medrol in the emergency room which we will hold and continue only prednisone.   Sepsis:  Patient meet sepsis criteria and was  empirically treated with broad-spectrum antibiotics.  Will follow cultures blood and sputum.  Further tailor antibiotic regimen as needed. Infectious disease consult per a.m. team.   Cavitary pneumonia: Attribute to MRSA pneumonia, differentials also include aspiration pneumonia.  We will continue patient on vancomycin, and Zosyn.  Seropositive rheumatoid arthritis: Home meds reviewed patient is not on any treatment for rheumatoid arthritis other than the prednisone that he is taking 10 mg twice a day.  Hypokalemia: Patient given potassium chloride IV 20 mEq.  Chronic diastolic congestive heart failure: Most recent echo report showing EF of 55 to 60%. Stable.  Abdominal pain: Patient status post CT of the abdomen and pelvis showing an incidental intussusception results as below. Cholelithiasis without evidence of pericholecystic inflammation by CT. Incidental note of a small bowel-small bowel intussusception within the mid left abdomen. No  evidence of bowel obstruction. Fat containing midline supraumbilical hernia without evidence of complication. We will start patient on IV PPI, if abdominal pain persists General surgery consult per a.m. team.  DVT prophylaxis:  Heparin   Code Status:  Full Code   Family Communication:  maleko, greulich (Brother)  [phone] (Home Phone)   Disposition Plan:  Home   Consults called:  None  Admission status: Inpatient.     Para Skeans MD [facility] [phone] How to contact the [facility] Attending or Consulting provider [place] or covering provider during after hours [place], for this patient.    Check the care team in [facility] and look for a) attending/consulting [facility] provider listed and b) the [facility] team listed Log into [email or web address] and use [place]'s universal password to access. If you do not have the password, please contact the hospital operator. Locate the [facility] provider you are looking for under [facility] and page to a number that you can be directly reached. If you still have difficulty reaching the provider, please page the [facility] (Director on Call) for the Hospitalists listed on amion for assistance. [email or web address] Password [email or web address] 09/17/2020, 8:07 PM

## 2020-09-17 NOTE — Progress Notes (Signed)
PHARMACY -  BRIEF ANTIBIOTIC NOTE   Pharmacy has received consult(s) for Vancomycin and Cefepime from an ED provider.  The patient's profile has been reviewed for ht/wt/allergies/indication/available labs.    One time order(s) placed by MD for Cefepime 2 gm and vancomycin 1 gm  Further antibiotics/pharmacy consults should be ordered by admitting physician if indicated.                       Thank you, Zyrion Coey A 09/17/2020  3:15 PM

## 2020-09-17 NOTE — Sepsis Progress Note (Signed)
eLink is following this Code Sepsis. °

## 2020-09-17 NOTE — Sepsis Progress Note (Signed)
Notified provider of need to order another repeat lactic acid.

## 2020-09-17 NOTE — ED Triage Notes (Signed)
Coughing up blood , sob, hernia abdominal ,

## 2020-09-18 ENCOUNTER — Inpatient Hospital Stay: Payer: Medicaid Other

## 2020-09-18 ENCOUNTER — Other Ambulatory Visit: Payer: Self-pay

## 2020-09-18 DIAGNOSIS — J984 Other disorders of lung: Secondary | ICD-10-CM

## 2020-09-18 DIAGNOSIS — J189 Pneumonia, unspecified organism: Secondary | ICD-10-CM

## 2020-09-18 DIAGNOSIS — J9621 Acute and chronic respiratory failure with hypoxia: Secondary | ICD-10-CM

## 2020-09-18 DIAGNOSIS — J852 Abscess of lung without pneumonia: Secondary | ICD-10-CM | POA: Diagnosis present

## 2020-09-18 LAB — CBC
HCT: 29.7 % — ABNORMAL LOW (ref 39.0–52.0)
Hemoglobin: 9.8 g/dL — ABNORMAL LOW (ref 13.0–17.0)
MCH: 27.5 pg (ref 26.0–34.0)
MCHC: 33 g/dL (ref 30.0–36.0)
MCV: 83.4 fL (ref 80.0–100.0)
Platelets: 203 10*3/uL (ref 150–400)
RBC: 3.56 MIL/uL — ABNORMAL LOW (ref 4.22–5.81)
RDW: 18.4 % — ABNORMAL HIGH (ref 11.5–15.5)
WBC: 4.9 10*3/uL (ref 4.0–10.5)
nRBC: 0 % (ref 0.0–0.2)

## 2020-09-18 LAB — COMPREHENSIVE METABOLIC PANEL
ALT: 9 U/L (ref 0–44)
AST: 15 U/L (ref 15–41)
Albumin: 1.8 g/dL — ABNORMAL LOW (ref 3.5–5.0)
Alkaline Phosphatase: 168 U/L — ABNORMAL HIGH (ref 38–126)
Anion gap: 1 — ABNORMAL LOW (ref 5–15)
BUN: 20 mg/dL (ref 6–20)
CO2: 26 mmol/L (ref 22–32)
Calcium: 7.2 mg/dL — ABNORMAL LOW (ref 8.9–10.3)
Chloride: 104 mmol/L (ref 98–111)
Creatinine, Ser: 0.48 mg/dL — ABNORMAL LOW (ref 0.61–1.24)
GFR, Estimated: >60 mL/min (ref >60)
Glucose, Bld: 72 mg/dL (ref 70–99)
Potassium: 3.6 mmol/L (ref 3.5–5.1)
Sodium: 131 mmol/L — ABNORMAL LOW (ref 135–145)
Total Bilirubin: 0.8 mg/dL (ref 0.3–1.2)
Total Protein: 5 g/dL — ABNORMAL LOW (ref 6.5–8.1)

## 2020-09-18 LAB — PROCALCITONIN: Procalcitonin: 0.82 ng/mL

## 2020-09-18 LAB — TYPE AND SCREEN
ABO/RH(D): A POS
Antibody Screen: NEGATIVE

## 2020-09-18 LAB — STREP PNEUMONIAE URINARY ANTIGEN: Strep Pneumo Urinary Antigen: NEGATIVE

## 2020-09-18 LAB — MRSA PCR SCREENING: MRSA by PCR: POSITIVE — AB

## 2020-09-18 IMAGING — MR MR HEAD WO/W CM
13 series · 48 of 48 positions shown · IV contrast (gadavist)
Comparison: No pertinent prior exams available for comparison.

CLINICAL DATA: Brain mass or lesion. Additional history provided:
60-year-old male with history of COPD, Felty syndrome and rheumatoid
arthritis. Recent MRSA pneumonia. Patient reports visual symptoms
and seizure-like activity a few weeks ago.

EXAM:
MRI HEAD WITHOUT AND WITH CONTRAST
TECHNIQUE: Multiplanar, multiecho pulse sequences of the brain and surrounding
structures were obtained without and with intravenous contrast.
CONTRAST:  6mL GADAVIST GADOBUTROL 1 MMOL/ML IV SOLN

[Series 5: ax dwi_tracew · axial · 3.0mm · 0.71mm/px · z∈[-71,+93]mm · 4 of 56 slices shown]
[im 1/56]
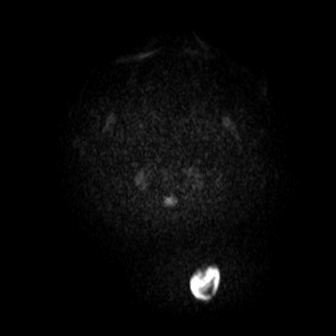
[im 19/56]
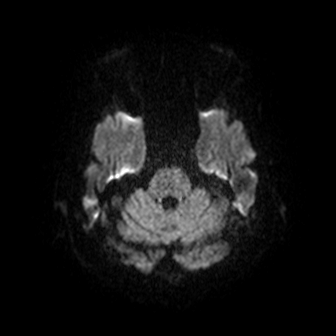
[im 37/56]
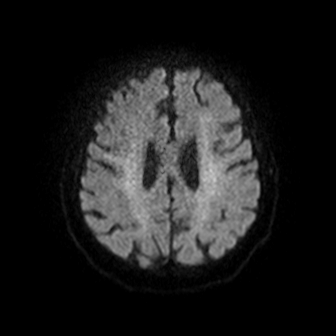
[im 56/56]
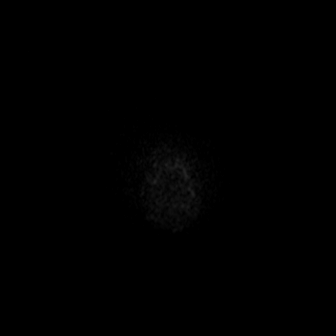

[Series 6: ax dwi_adc · axial · 3.0mm · 0.71mm/px · z∈[-71,+93]mm · 4 of 56 slices shown]
[im 1/56]
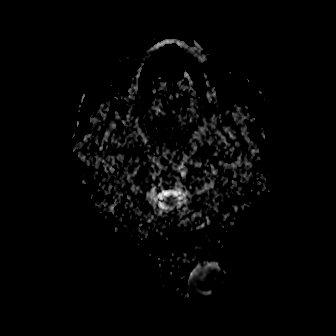
[im 19/56]
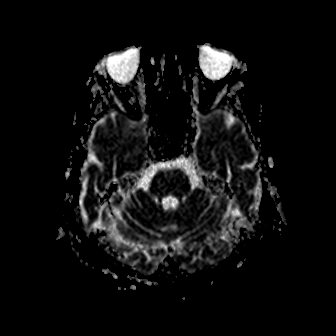
[im 37/56]
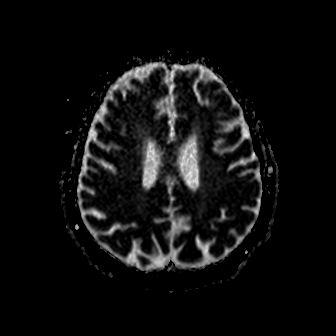
[im 56/56]
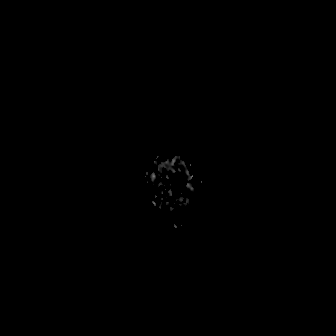

[Series 7: cor dwi_tracew · coronal · 5.0mm · 0.68mm/px · 3 of 40 slices shown]
[im 1/40]
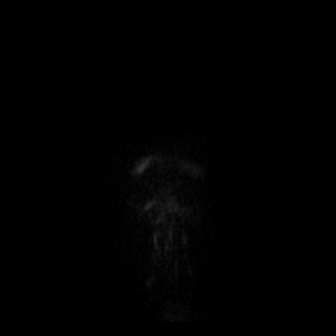
[im 20/40]
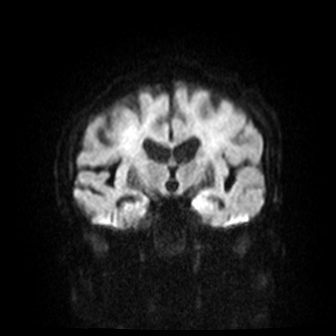
[im 40/40]
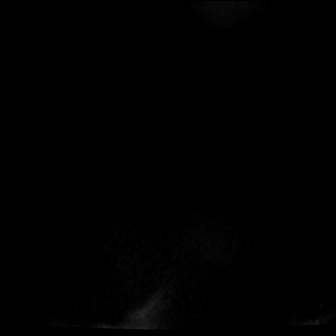

[Series 8: cor dwi_adc · coronal · 5.0mm · 0.68mm/px · 2 of 40 slices shown]
[im 1/40]
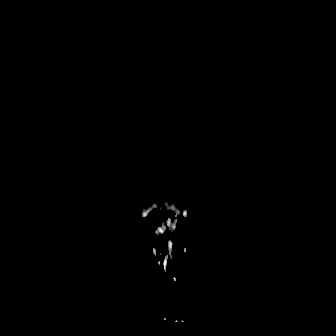
[im 40/40]
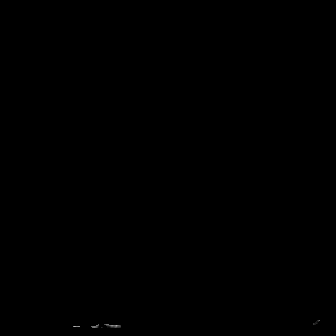

[Series 9: T1 · sagittal · 5.0mm · 0.47mm/px · 1 of 24 slices shown (1 of 2)]
[im 1/24]
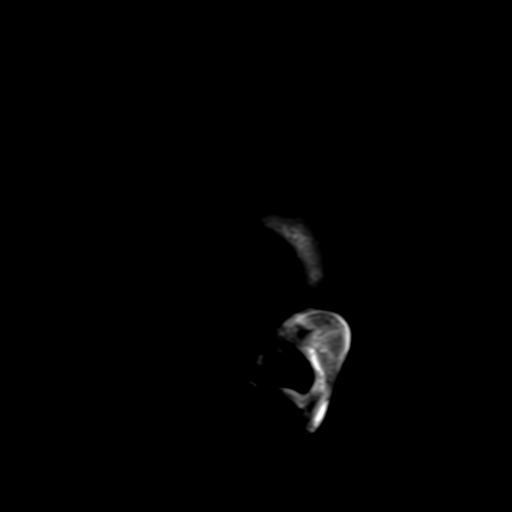

[Series 10: T2 · axial · 5.0mm · 0.86mm/px · 1 of 25 slices shown (1 of 2)]
[im 1/25]
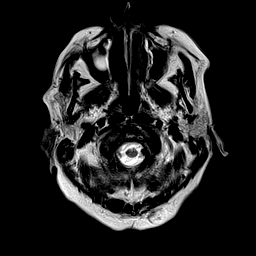

[Series 12: pha_images · axial · 3.0mm · 0.90mm/px · z∈[-64,+88]mm · 3 of 52 slices shown]
[im 1/52]
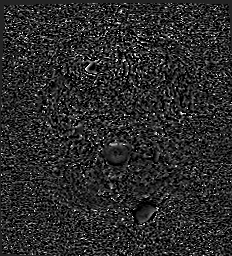
[im 26/52]
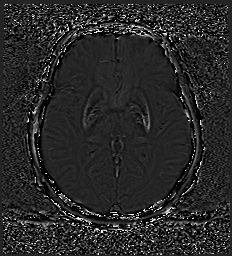
[im 52/52]
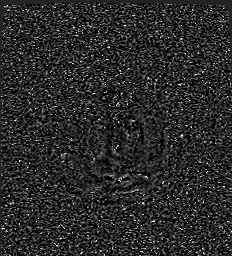

[Series 13: swi_images · axial · 3.0mm · 0.90mm/px · z∈[-64,+88]mm · 3 of 52 slices shown]
[im 1/52]
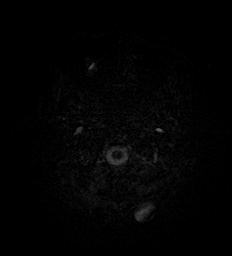
[im 26/52]
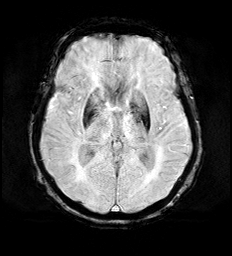
[im 52/52]
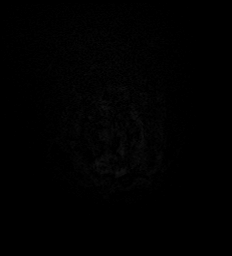

[Series 15: FLAIR · axial · 3.0mm · 0.69mm/px · z∈[-68,+93]mm · 3 of 55 slices shown]
[im 1/55]
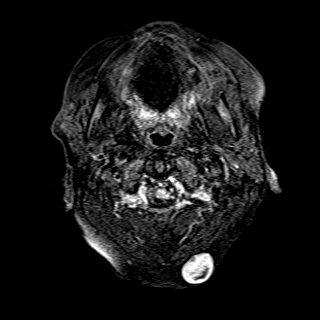
[im 28/55]
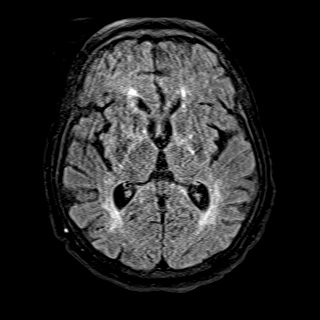
[im 55/55]
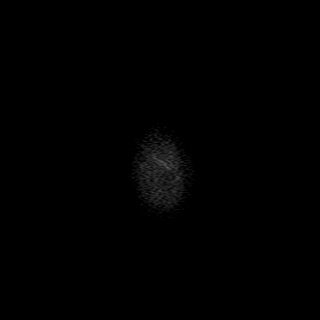

[Series 16: T1 · axial · 1.0mm · 0.98mm/px · z∈[-77,+97]mm · 10 of 176 slices shown (2 of 2)]
[im 1/176]
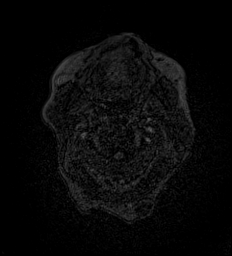
[im 20/176]
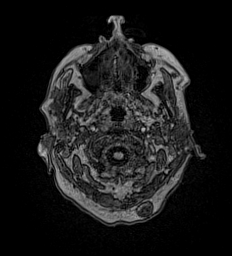
[im 39/176]
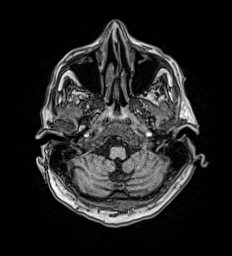
[im 59/176]
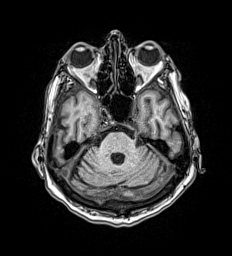
[im 78/176]
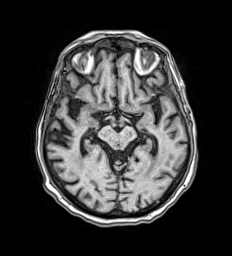
[im 98/176]
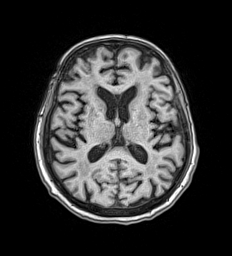
[im 117/176]
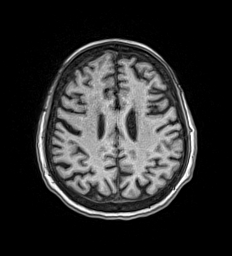
[im 137/176]
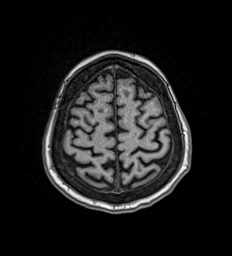
[im 156/176]
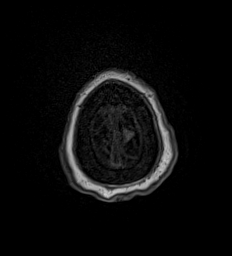
[im 176/176]
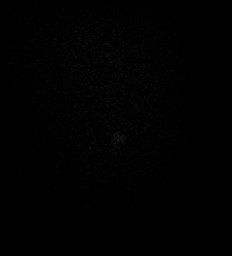

[Series 17: T2 · coronal · 5.0mm · 0.86mm/px · 2 of 30 slices shown (2 of 2)]
[im 1/30]
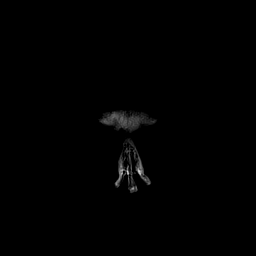
[im 30/30]
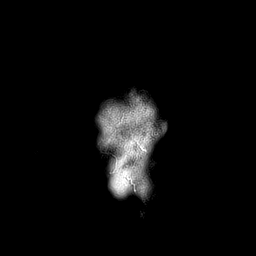

[Series 18: T1 post-contrast · axial · 1.0mm · 0.98mm/px · z∈[-77,+97]mm · 10 of 176 slices shown (1 of 2)]
[im 1/176]
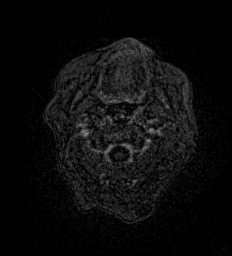
[im 20/176]
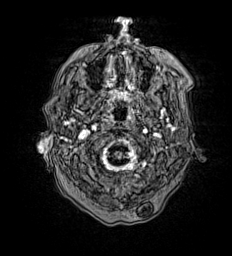
[im 39/176]
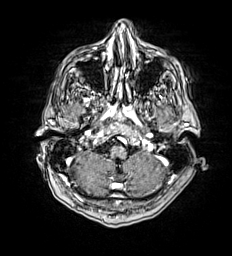
[im 59/176]
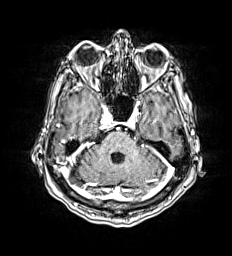
[im 78/176]
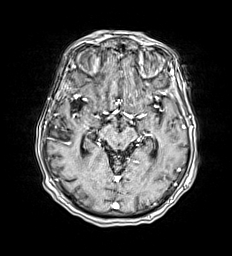
[im 98/176]
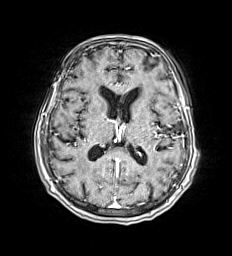
[im 117/176]
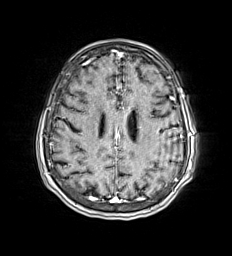
[im 137/176]
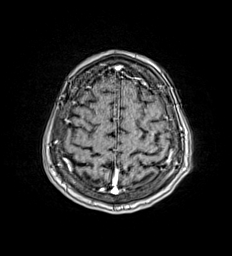
[im 156/176]
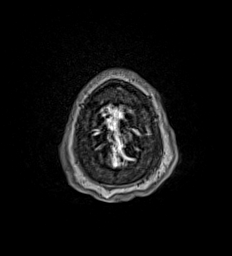
[im 176/176]
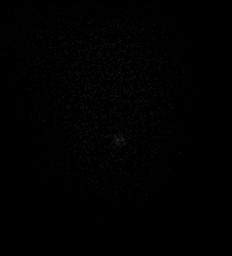

[Series 19: T1 post-contrast · coronal · 5.0mm · 0.43mm/px · 2 of 30 slices shown (2 of 2)]
[im 1/30]
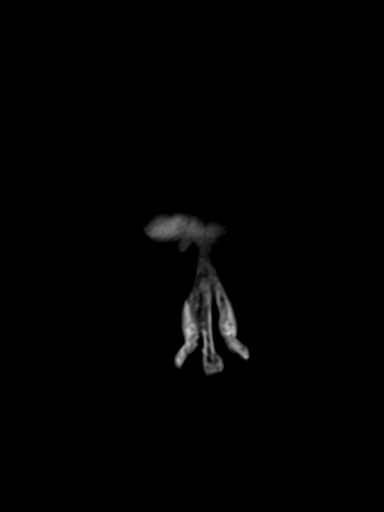
[im 30/30]
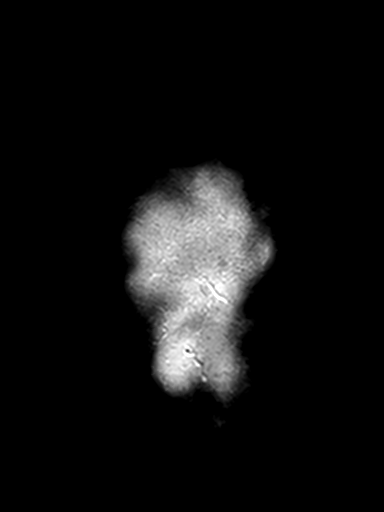

[48 of 48 positions shown; findings below may reference images not displayed]

FINDINGS: Brain:

Intermittently motion degraded examination, limiting evaluation.
Most notably, there is moderate/severe motion degradation of the
sagittal T1 weighted sequence, moderate/severe motion degradation of
the axial T2/FLAIR sequence, moderate motion degradation of the
axial T1 weighted postcontrast sequence and moderate motion
degradation of the coronal T1 weighted postcontrast sequence.

Mild generalized cerebral and cerebellar atrophy.

Advanced patchy and confluent T2/FLAIR hyperintensity within the
cerebral white matter, nonspecific but most often secondary to
chronic small vessel ischemia.

Chronic lacunar infarct within the left lentiform nucleus.

Small foci of SWI signal loss within the left basal ganglia and
along the margin of the posterior right lateral ventricle, likely
reflecting chronic microhemorrhages. Additional subcentimeter focus
of SWI signal loss within the midline cerebellum, which may reflect
an additional chronic microhemorrhage or cavernoma.

There is no acute infarct.

No evidence of intracranial mass.

No extra-axial fluid collection.

No midline shift.

Within the limitations of motion degradation, no abnormal
intracranial enhancement is identified.

Vascular: Expected proximal arterial flow voids. Non dominant
intracranial right vertebral artery.

Skull and upper cervical spine: Within the limitations of motion
degradation, no focal marrow lesion is identified.

Sinuses/Orbits: Visualized orbits show no acute finding. 1.9 cm
right maxillary sinus mucous retention cyst. Trace left maxillary
sinus mucosal thickening.

Other: Trace bilateral mastoid effusions. 2.8 cm ovoid lesion within
the left suboccipital scalp demonstrating heterogeneous but
predominantly T2 hyperintense signal, T1 hypointense signal,
restricted diffusion and no convincing abnormal enhancement. This is
favored to reflect a sebaceous/epidermoid cyst.
IMPRESSION: Motion degraded examination, as described and limiting evaluation.

No evidence of acute intracranial abnormality.

Severe patchy and confluent T2/FLAIR hyperintensity within the
cerebral white matter, nonspecific but most often secondary to
chronic small vessel ischemia.

Chronic left basal ganglia lacunar infarct.

Subcentimeter focus of SWI signal loss within the midline
cerebellum, which may reflect a chronic microhemorrhage or
cavernoma.

Mild generalized parenchymal atrophy.

Incidentally noted 1.9 cm right maxillary sinus mucous retention
cyst.

Trace bilateral mastoid effusions.

## 2020-09-18 MED ORDER — IPRATROPIUM-ALBUTEROL 0.5-2.5 (3) MG/3ML IN SOLN
3.0000 mL | RESPIRATORY_TRACT | Status: DC | PRN
  Administered 2020-09-18 – 2020-09-25 (×5): 3 mL via RESPIRATORY_TRACT
  Filled 2020-09-18 (×5): qty 3

## 2020-09-18 MED ORDER — GADOBUTROL 1 MMOL/ML IV SOLN
6.0000 mL | Freq: Once | INTRAVENOUS | Status: AC | PRN
  Administered 2020-09-18: 6 mL via INTRAVENOUS

## 2020-09-18 MED ORDER — BENZONATATE 100 MG PO CAPS
200.0000 mg | ORAL_CAPSULE | Freq: Two times a day (BID) | ORAL | Status: DC | PRN
  Administered 2020-09-18 – 2020-09-28 (×13): 200 mg via ORAL
  Filled 2020-09-18 (×13): qty 2

## 2020-09-18 MED ORDER — HYDROCOD POLST-CPM POLST ER 10-8 MG/5ML PO SUER
5.0000 mL | Freq: Two times a day (BID) | ORAL | Status: DC | PRN
  Administered 2020-09-19 – 2020-09-28 (×11): 5 mL via ORAL
  Filled 2020-09-18 (×12): qty 5

## 2020-09-18 NOTE — ED Notes (Signed)
SLP at bedside.

## 2020-09-18 NOTE — Progress Notes (Signed)
SLP Cancellation Note  Patient Details Name: Marcus [name] MRN: [id number] DOB: 29-Aug-1960   Cancelled treatment:       Reason Eval/Treat Not Completed: Patient at procedure or test/unavailable (only a brief moment w/ pt, brother b/f MRI). Met w/ pt discussing his swallowing and any potential difficulty -- both pt and Brother present denied any difficulty eating/drinking a the recent Lunch meal (most of the meal was eaten on tray table). Pt was sipping thin liquids during conversation w/out overt deficits.  At baseline during conversation, pt had frequent coughing episodes w/ expectoration of phlegm. He denied this occurred w/ oral intake. ST services recently performed MBSS 08/2020 w/ No aspiration noted during study. ST services will f/u w/ pt tomorrow -- pt left room for MRI during this screening session. Modified diet for ease of mastication d/t missing Dentition; aspiration precautions placed in chart. NSG updated.     Orinda Kenner, MS, CCC-SLP Speech Language Pathologist Rehab Services [phone] [facility] 09/18/2020, 4:52 PM

## 2020-09-18 NOTE — ED Notes (Signed)
Patient to MRI.

## 2020-09-18 NOTE — Assessment & Plan Note (Addendum)
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  Today ANC 0.5 -proceed with Granix daily x3-keep ANC greater than 1.5.  #MRSA-lung abscess s/p IV vancomycin-worsening noted question underlying fungal infection-followed by pulmonary.

## 2020-09-18 NOTE — Consult Note (Signed)
Pulmonary Medicine          Date: 09/18/2020,   MRN# [id number] Marcus [name] 1960/12/08     Admission                  Current    Referring physician: Dr Loleta Books   CHIEF COMPLAINT:   Pneumonia of right lung with cavitary mass   HISTORY OF PRESENT ILLNESS   60 yo M w/ hx of COPD, Feltys syndrome (enlarged spleen, low WBC count associated with RA), epigastric hernia and rheumatoid arthritis.  Patient had admission 01/2020 to [facility] with findings of pneumoperitoneum he was intubated during this time, had procedure done with graham patch incision hernia repair s/p exlap. He had MRSA pneumonia 08/2020, he has been on antibiotics with doxycycline on outpatient. He came in to ER with complaints of hemoptysis.  PCCM consultation for additional evaluation and management. Patient reports having visual symptoms and seizure like activity few weeks ago.   PAST MEDICAL HISTORY   Past Medical History:  Diagnosis Date  . COPD (chronic obstructive pulmonary disease) ([place])   . 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 Hernia, epigastric   . Pancytopenia ([place])   . Seropositive rheumatoid arthritis ([place])   . Tobacco use disorder      SURGICAL HISTORY   Past Surgical History:  Procedure Laterality Date  . INCISIONAL HERNIA REPAIR  01/17/2020   Procedure: HERNIA REPAIR INCISIONAL AND Silvestre Gunner;  Surgeon: Kinsinger, Arta Bruce, MD;  Location: [facility];  Service: General;;  . LAPAROTOMY N/A 01/17/2020   Procedure: EXPLORATORY LAPAROTOMY;  Surgeon: Kieth Brightly Arta Bruce, MD;  Location: [facility];  Service: General;  Laterality: N/A;     FAMILY HISTORY   Family History  Problem Relation Age of Onset  . CAD Brother   . COPD Brother      SOCIAL HISTORY   Social History   Tobacco Use  . Smoking status: Every Day    Packs/day: 1.00    Pack years: 0.00    Types: Cigarettes  . Smokeless tobacco: Never  Substance Use Topics  . Alcohol use: No    Alcohol/week: 0.0 standard drinks  .  Drug use: Yes    Types: Marijuana     MEDICATIONS    Home Medication:  Current Outpatient Rx  . Order #: [id number] Class: Print  . Order #: [id number] Class: Normal  . Order #: [id number] Class: Normal  . Order #: [id number] Class: Normal  . Order #: [id number] Class: Normal  . Order #: [id number] Class: Normal  . Order #: [id number] Class: Normal  . Order #: [id number] Class: Normal    Current Medication:  Current Facility-Administered Medications:  .  acetaminophen (TYLENOL) tablet 650 mg, 650 mg, Oral, Q6H PRN **OR** acetaminophen (TYLENOL) suppository 650 mg, 650 mg, Rectal, Q6H PRN, Florina Ou V, MD .  heparin injection 5,000 Units, 5,000 Units, Subcutaneous, Q8H, Para Skeans, MD, 5,000 Units at 09/18/20 0542 .  nicotine (NICODERM CQ - dosed in mg/24 hours) patch 21 mg, 21 mg, Transdermal, Daily, Para Skeans, MD, 21 mg at 09/17/20 2151 .  pantoprazole (PROTONIX) injection 40 mg, 40 mg, Intravenous, Q24H, Para Skeans, MD, 40 mg at 09/17/20 2151 .  piperacillin-tazobactam (ZOSYN) IVPB 3.375 g, 3.375 g, Intravenous, Q8H, Dorothe Pea, RPH, Last Rate: 12.5 mL/hr at 09/18/20 0542, 3.375 g at 09/18/20 0542 .  predniSONE (DELTASONE) tablet 10 mg, 10 mg, Oral, BID WC, Patel, Ekta V, MD .  vancomycin (VANCOREADY) IVPB 750 mg/150 mL, 750  mg, Intravenous, Q12H, Darnelle Bos [place] [place], Stopped at 09/18/20 3532  Current Outpatient Medications:  .  albuterol (PROVENTIL) (2.5 MG/3ML) 0.083% nebulizer solution, Take 3 mLs (2.5 mg total) by nebulization every 6 (six) hours as needed for wheezing or shortness of breath., Disp: 75 mL, Rfl: 1 .  albuterol (VENTOLIN HFA) 108 (90 Base) MCG/ACT inhaler, Inhale 2 puffs into the lungs every 6 (six) hours as needed for wheezing or shortness of breath., Disp: 8 g, Rfl: 2 .  doxycycline (VIBRA-TABS) 100 MG tablet, Take 1 tablet (100 mg total) by mouth every 12 (twelve) hours., Disp: 84 tablet, Rfl: 0 .  predniSONE (DELTASONE) 10 MG tablet, Take 1 tablet (10 mg  total) by mouth 2 (two) times daily with a meal., Disp: 60 tablet, Rfl: 1 .  feeding supplement (ENSURE ENLIVE / ENSURE PLUS) LIQD, Take 237 mLs by mouth 2 (two) times daily between meals., Disp: 237 mL, Rfl: 12 .  guaiFENesin-dextromethorphan (ROBITUSSIN DM) 100-10 MG/5ML syrup, Take 5 mLs by mouth every 4 (four) hours as needed for cough. (Patient not taking: No sig reported), Disp: 118 mL, Rfl: 0 .  mometasone-formoterol (DULERA) 100-5 MCG/ACT AERO, Inhale 2 puffs into the lungs 2 (two) times daily. (Patient not taking: Reported on 09/17/2020), Disp: 1 each, Rfl: 0 .  pantoprazole (PROTONIX) 40 MG tablet, Take 1 tablet (40 mg total) by mouth daily for 10 days. (Patient not taking: Reported on 09/17/2020), Disp: 10 tablet, Rfl: 0    ALLERGIES   Levaquin [levofloxacin in d5w] and Methotrexate     REVIEW OF SYSTEMS    Review of Systems:  Gen:  Denies  fever, sweats, chills weigh loss  HEENT: Denies blurred vision, double vision, ear pain, eye pain, hearing loss, nose bleeds, sore throat Cardiac:  No dizziness, chest pain or heaviness, chest tightness,edema Resp:   reports dyspnea/sob/chest pain worse on right, hemoptysis  Gi: Denies swallowing difficulty, stomach pain, nausea or vomiting, diarrhea, constipation, bowel incontinence Gu:  Denies bladder incontinence, burning urine Ext:   Denies Joint pain, stiffness or swelling Skin: Denies  skin rash, easy bruising or bleeding or hives Endoc:  Denies polyuria, polydipsia , polyphagia or weight change Psych:   Denies depression, insomnia or hallucinations   Other:  All other systems negative   VS: BP 114/64   Pulse (!) 101   Temp 98.8 F (37.1 C) (Oral)   Resp 18   SpO2 98%      PHYSICAL EXAM    GENERAL:NAD, no fevers, chills, no weakness no fatigue HEAD: Normocephalic, atraumatic.  EYES: Pupils equal, round, reactive to light. Extraocular muscles intact. No scleral icterus.  MOUTH: Moist mucosal membrane. Dentition  intact. No abscess noted.  EAR, NOSE, THROAT: Clear without exudates. No external lesions.  NECK: Supple. No thyromegaly. No nodules. No JVD.  PULMONARY: rhonchi at right upper lung zone CARDIOVASCULAR: S1 and S2. Regular rate and rhythm. No murmurs, rubs, or gallops. No edema. Pedal pulses 2+ bilaterally.  GASTROINTESTINAL: Soft, nontender, nondistended. No masses. Positive bowel sounds. No hepatosplenomegaly.  MUSCULOSKELETAL: No swelling, clubbing, or edema. Range of motion full in all extremities.  NEUROLOGIC: Cranial nerves II through XII are intact. No gross focal neurological deficits. Sensation intact. Reflexes intact.  SKIN: No ulceration, lesions, rashes, or cyanosis. Skin warm and dry. Turgor intact.  PSYCHIATRIC: Mood, affect within normal limits. The patient is awake, alert and oriented x 3. Insight, judgment intact.       IMAGING    DG Chest 2 View  Result Date: 09/06/2020 CLINICAL DATA:  Shortness of breath.  Cough. EXAM: CHEST - 2 VIEW COMPARISON:  Chest x-ray 08/28/2020.  CT 08/22/2020. FINDINGS: Mediastinum unremarkable. Heart size normal. Cavitary process in the right mid lung again noted. No change identified. No pleural effusion or pneumothorax. Old right rib fractures. IMPRESSION: Cavitary process in the right mid lung again noted. Cavitary process best identified by prior CT. Chest is unchanged from prior exam. Electronically Signed   By: Marcello Moores  Register   On: 09/06/2020 14:04   CT Chest Wo Contrast  Result Date: 08/22/2020 CLINICAL DATA:  Abnormal x-ray. Interstitial infiltrates with possible right perihilar cavitation. Cough and shortness of breath. EXAM: CT CHEST WITHOUT CONTRAST TECHNIQUE: Multidetector CT imaging of the chest was performed following the standard protocol without IV contrast. COMPARISON:  Radiographs 08/22/2020 and 08/04/2020.  CT 06/10/2020. FINDINGS: Cardiovascular: Extensive three-vessel coronary artery atherosclerosis with lesser involvement of  the aorta and great vessels. The heart size is normal. There is no pericardial effusion. Mediastinum/Nodes: Multiple small mediastinal and hilar lymph nodes have enlarged compared with the chest CT of 2 months ago, likely reactive. No axillary lymphadenopathy. The thyroid gland, trachea and esophagus demonstrate no significant findings. Lungs/Pleura: There is no pleural effusion or pneumothorax. Moderate centrilobular and paraseptal emphysema. As seen on the earlier radiographs, there is a new cavitary mass inferomedially in the right upper lobe, measuring up to 7.8 x 5.5 cm on image 79/4. This has irregular thick walls and associated air-fluid levels. There was no abnormality in this area on the relatively recent prior chest CT, and this is most consistent with cavitating pneumonia. There is an additional cavitary lesion with an air-fluid level medially in the left upper lobe, measuring up to 2.4 cm on image 65/4. There is mild patchy airspace disease surrounding these cavitary infiltrates. Upper abdomen: Small calcified gallstones. No evidence of biliary dilatation or adrenal mass. Postsurgical changes in the anterior abdominal wall consistent with previous hernia repair. Musculoskeletal/Chest wall: There is no chest wall mass or suspicious osseous finding. There are old fractures of the right 5th and 6th ribs posterolaterally which appear unchanged. IMPRESSION: 1. CT confirms the presence of a cavitary infiltrate in the right upper lobe and shows a smaller area of cavitation anteriorly in the left upper lobe. No abnormalities were present in these areas on CT performed 2 months ago to suggest cavitary neoplasm, and these findings are consistent with cavitating/necrotizing pneumonia. 2. Mildly enlarged mediastinal and hilar lymph nodes, likely reactive. 3. No associated pleural disease. 4. Cholelithiasis. 5. Underlying moderate Emphysema (ICD10-J43.9). 6. Coronary and Aortic Atherosclerosis (ICD10-I70.0).  Electronically Signed   By: Richardean Sale M.D.   On: 08/22/2020 14:26   CT Angio Chest PE W and/or Wo Contrast  Result Date: 09/17/2020 CLINICAL DATA:  Hemoptysis, shortness of breath, abdominal distension EXAM: CT ANGIOGRAPHY CHEST CT ABDOMEN AND PELVIS WITH CONTRAST TECHNIQUE: Multidetector CT imaging of the chest was performed using the standard protocol during bolus administration of intravenous contrast. Multiplanar CT image reconstructions and MIPs were obtained to evaluate the vascular anatomy. Multidetector CT imaging of the abdomen and pelvis was performed using the standard protocol during bolus administration of intravenous contrast. CONTRAST:  42mL OMNIPAQUE IOHEXOL 350 MG/ML SOLN COMPARISON:  CT 06/20/2020, 06/22/2020 FINDINGS: CTA CHEST FINDINGS Cardiovascular: Satisfactory opacification of the pulmonary arteries to the segmental level. No evidence of pulmonary embolism. Thoracic aorta is normal in course and caliber. Atherosclerotic calcifications of the aorta and coronary arteries. Normal heart size. No pericardial effusion. Mediastinum/Nodes:  Mildly prominent mediastinal lymph nodes are again noted, likely reactive. No axillary or hilar lymphadenopathy. Lungs/Pleura: Redemonstrated cavitary mass located within the inferomedial aspect of the right upper lobe measuring approximately 9.9 x 6.6 x 7.4 cm (previously measured approximately 7.8 x 4.5 x 5.5 cm on 08/22/2020. Increased surrounding consolidation and ground-glass opacity within the adjacent right upper lobe and right middle lobe previously seen cavitary lesion within the medial aspect of the left upper lobe has resolved with small thin walled pleural cyst remaining (series 6, image 44). Scattered areas of ground-glass opacity within the bilateral lower lobes and upper lobes. Background of moderate emphysema. No pleural effusion or pneumothorax. Musculoskeletal: Remote right sixth and seventh rib fractures. No new or acute osseous  findings. No chest wall abnormality. Review of the MIP images confirms the above findings. CT ABDOMEN and PELVIS FINDINGS Hepatobiliary: Cholelithiasis without evidence of pericholecystic inflammation by CT. Liver within normal limits. No biliary dilatation. Pancreas: Unremarkable. No pancreatic ductal dilatation or surrounding inflammatory changes. Spleen: Prior splenectomy. Adrenals/Urinary Tract: Unremarkable adrenal glands. Kidneys enhance symmetrically without focal lesion, stone, or hydronephrosis. Ureters are nondilated. Urinary bladder appears unremarkable. Stomach/Bowel: Stomach is within normal limits. Appendix appears normal (series 2, image 58). Incidental note of a small bowel-small bowel intussusception within the mid left abdomen (series 2, image 38). No evidence of bowel wall thickening, distention, or inflammatory changes. Vascular/Lymphatic: Fusiform infrarenal abdominal aortic aneurysm measuring up to 3.1 cm, unchanged. Aortoiliac atherosclerosis. No abdominopelvic lymphadenopathy. Reproductive: Prostate is unremarkable. Other: No free fluid. No abdominopelvic fluid collection. No pneumoperitoneum. Fat containing midline supraumbilical hernia without evidence of complication. Musculoskeletal: No acute or significant osseous findings. Review of the MIP images confirms the above findings. IMPRESSION: 1. No evidence of acute pulmonary embolism. 2. Enlarging cavitary mass within the inferomedial aspect of the right upper lobe measuring up to 9.9 cm (previously measured approximately 7.8 x 4.5 x 5.5 cm on 08/22/2020). Increased surrounding consolidation and ground-glass opacity within the adjacent right upper lobe and right middle lobe. Findings remain most compatible with cavitary/necrotizing pneumonia. 3. Scattered areas of ground-glass opacity within the bilateral lower lobes and upper lobes, concerning for multifocal infection. 4. Interval resolution of previously seen cavitary lesion within the  medial aspect of the left upper lobe with small thin walled pleural cyst remaining. 5. Cholelithiasis without evidence of pericholecystic inflammation by CT. 6. Incidental note of a small bowel-small bowel intussusception within the mid left abdomen. No evidence of bowel obstruction. 7. Fat containing midline supraumbilical hernia without evidence of complication. 8. Stable infrarenal abdominal aortic aneurysm measuring up to 3.1 cm. Recommend follow-up ultrasound every 3 years. This recommendation follows ACR consensus guidelines: White Paper of the ACR Incidental Findings Committee II on Vascular Findings. J Am Coll Radiol 2013; 10:789-794. Aortic Atherosclerosis (ICD10-I70.0) and Emphysema (ICD10-J43.9). Electronically Signed   By: Davina Poke D.O.   On: 09/17/2020 16:38   CT ABDOMEN PELVIS W CONTRAST  Result Date: 09/17/2020 CLINICAL DATA:  Hemoptysis, shortness of breath, abdominal distension EXAM: CT ANGIOGRAPHY CHEST CT ABDOMEN AND PELVIS WITH CONTRAST TECHNIQUE: Multidetector CT imaging of the chest was performed using the standard protocol during bolus administration of intravenous contrast. Multiplanar CT image reconstructions and MIPs were obtained to evaluate the vascular anatomy. Multidetector CT imaging of the abdomen and pelvis was performed using the standard protocol during bolus administration of intravenous contrast. CONTRAST:  52mL OMNIPAQUE IOHEXOL 350 MG/ML SOLN COMPARISON:  CT 06/20/2020, 06/22/2020 FINDINGS: CTA CHEST FINDINGS Cardiovascular: Satisfactory opacification of the pulmonary arteries to  the segmental level. No evidence of pulmonary embolism. Thoracic aorta is normal in course and caliber. Atherosclerotic calcifications of the aorta and coronary arteries. Normal heart size. No pericardial effusion. Mediastinum/Nodes: Mildly prominent mediastinal lymph nodes are again noted, likely reactive. No axillary or hilar lymphadenopathy. Lungs/Pleura: Redemonstrated cavitary mass  located within the inferomedial aspect of the right upper lobe measuring approximately 9.9 x 6.6 x 7.4 cm (previously measured approximately 7.8 x 4.5 x 5.5 cm on 08/22/2020. Increased surrounding consolidation and ground-glass opacity within the adjacent right upper lobe and right middle lobe previously seen cavitary lesion within the medial aspect of the left upper lobe has resolved with small thin walled pleural cyst remaining (series 6, image 44). Scattered areas of ground-glass opacity within the bilateral lower lobes and upper lobes. Background of moderate emphysema. No pleural effusion or pneumothorax. Musculoskeletal: Remote right sixth and seventh rib fractures. No new or acute osseous findings. No chest wall abnormality. Review of the MIP images confirms the above findings. CT ABDOMEN and PELVIS FINDINGS Hepatobiliary: Cholelithiasis without evidence of pericholecystic inflammation by CT. Liver within normal limits. No biliary dilatation. Pancreas: Unremarkable. No pancreatic ductal dilatation or surrounding inflammatory changes. Spleen: Prior splenectomy. Adrenals/Urinary Tract: Unremarkable adrenal glands. Kidneys enhance symmetrically without focal lesion, stone, or hydronephrosis. Ureters are nondilated. Urinary bladder appears unremarkable. Stomach/Bowel: Stomach is within normal limits. Appendix appears normal (series 2, image 58). Incidental note of a small bowel-small bowel intussusception within the mid left abdomen (series 2, image 38). No evidence of bowel wall thickening, distention, or inflammatory changes. Vascular/Lymphatic: Fusiform infrarenal abdominal aortic aneurysm measuring up to 3.1 cm, unchanged. Aortoiliac atherosclerosis. No abdominopelvic lymphadenopathy. Reproductive: Prostate is unremarkable. Other: No free fluid. No abdominopelvic fluid collection. No pneumoperitoneum. Fat containing midline supraumbilical hernia without evidence of complication. Musculoskeletal: No acute or  significant osseous findings. Review of the MIP images confirms the above findings. IMPRESSION: 1. No evidence of acute pulmonary embolism. 2. Enlarging cavitary mass within the inferomedial aspect of the right upper lobe measuring up to 9.9 cm (previously measured approximately 7.8 x 4.5 x 5.5 cm on 08/22/2020). Increased surrounding consolidation and ground-glass opacity within the adjacent right upper lobe and right middle lobe. Findings remain most compatible with cavitary/necrotizing pneumonia. 3. Scattered areas of ground-glass opacity within the bilateral lower lobes and upper lobes, concerning for multifocal infection. 4. Interval resolution of previously seen cavitary lesion within the medial aspect of the left upper lobe with small thin walled pleural cyst remaining. 5. Cholelithiasis without evidence of pericholecystic inflammation by CT. 6. Incidental note of a small bowel-small bowel intussusception within the mid left abdomen. No evidence of bowel obstruction. 7. Fat containing midline supraumbilical hernia without evidence of complication. 8. Stable infrarenal abdominal aortic aneurysm measuring up to 3.1 cm. Recommend follow-up ultrasound every 3 years. This recommendation follows ACR consensus guidelines: White Paper of the ACR Incidental Findings Committee II on Vascular Findings. J Am Coll Radiol 2013; 10:789-794. Aortic Atherosclerosis (ICD10-I70.0) and Emphysema (ICD10-J43.9). Electronically Signed   By: Davina Poke D.O.   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 There is increase in consolidation in this area compared to most recent study. Lungs elsewhere clear. Heart size and pulmonary vascular normal. No  adenopathy appreciable. No bone lesions. IMPRESSION: Cavitary mass right middle lobe anteriorly with increase in surrounding consolidation compared to most recent study. The lungs elsewhere are clear. Heart size normal. No adenopathy appreciable by radiography. Electronically Signed   By: Lowella Grip III M.D.   On: 09/17/2020 13:54   DG Chest Port 1 View  Result Date: 08/28/2020 CLINICAL DATA:  Hypoxia. EXAM: PORTABLE CHEST 1 VIEW COMPARISON:  CT 08/22/2020.  Chest x-ray 08/22/2020. FINDINGS: Mediastinum hilar structures normal. Heart size normal. Cavitary process in the right mid lung and left upper lung best identified by prior CT. No new findings are noted. No pleural effusion or pneumothorax. Heart size stable. Degenerative change thoracic spine. Old right rib fractures present. IMPRESSION: Cavitary process in the right mid lung and left upper lung best identified by prior CT. Chest is unchanged from prior exam. Electronically Signed   By: Marcello Moores  Register   On: 08/28/2020 08:25   DG Chest Portable 1 View  Result Date: 08/22/2020 CLINICAL DATA:  Shortness of breath EXAM: PORTABLE CHEST 1 VIEW COMPARISON:  August 04, 2020 FINDINGS: There is apparent scarring in the right perihilar region. There is suggestion of cavitation in this area in the right perihilar region. Slight scarring lateral right base. The lungs elsewhere are clear. Heart size and pulmonary vascularity are normal. No appreciable adenopathy. No bone lesions. IMPRESSION: Scarring right mid lung. Suggestion of developing cavitation in this area which could be due to overlapping of areas of scarring and normal structures. Given concern for cavitation in this area, noncontrast chest CT advised to further evaluate. Slight scarring lateral right base. Lungs elsewhere are clear. Heart size normal. No adenopathy evident by radiography. Electronically Signed   By: Lowella Grip III M.D.   On: 08/22/2020 13:24   DG Swallowing Func-Speech  Pathology  Result Date: 09/06/2020 Please refer to "Notes" tab for Speech Pathology notes.  ECHOCARDIOGRAM COMPLETE  Result Date: 08/28/2020    ECHOCARDIOGRAM REPORT   Patient Name:   Marcus [name] [facility] Date of Exam: 08/28/2020 Medical Rec #:  [id number]    Height:       66.0 in Accession #:    [id number]   Weight:       133.6 lb Date of Birth:  [birth year]     BSA:          1.685 m Patient Age:    47 years     BP:           120/74 mmHg Patient Gender: M            HR:           76 bpm. 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 COPD. Tobacco use disorder.  Sonographer:     Sherrie Sport RDCS (AE) Referring Phys:  [id number] Sharen Hones Diagnosing Phys: Nelva Bush MD  Sonographer Comments: Technically challenging study due to limited acoustic windows. Image acquisition challenging due to COPD. IMPRESSIONS  1. Left ventricular ejection fraction, by estimation, is 55 to 60%. The left ventricle has normal function. Left ventricular endocardial border not optimally defined to evaluate regional wall motion. Left ventricular diastolic parameters are consistent with Grade I diastolic dysfunction (impaired relaxation).  2. Right  ventricular systolic function is normal. The right ventricular size is mildly enlarged. Mildly increased right ventricular wall thickness. There is normal pulmonary artery systolic pressure.  3. The mitral valve is degenerative. Trivial mitral valve regurgitation. No evidence of mitral stenosis.  4. The aortic valve was not well visualized. Aortic valve regurgitation is not visualized. No aortic stenosis is present.  5. Pulmonic valve regurgitation not well assessed.  6. The inferior vena cava is normal in size with greater than 50% respiratory variability, suggesting right atrial pressure of 3 mmHg.  Conclusion(s)/Recommendation(s): Valves suboptimally imaged to exclude endocarditis. If clinical concern persists, transesophageal echocardiogram should be considered. FINDINGS  Left Ventricle: Left ventricular ejection fraction, by estimation, is 55 to 60%. The left ventricle has normal function. Left ventricular endocardial border not optimally defined to evaluate regional wall motion. The left ventricular internal cavity size was normal in size. There is no left ventricular hypertrophy. Left ventricular diastolic parameters are consistent with Grade I diastolic dysfunction (impaired relaxation). Right Ventricle: The right ventricular size is mildly enlarged. Mildly increased right ventricular wall thickness. Right ventricular systolic function is normal. There is normal pulmonary artery systolic pressure. The tricuspid regurgitant velocity is 1.72 m/s, and with an assumed right atrial pressure of 3 mmHg, the estimated right ventricular systolic pressure is 21.3 mmHg. Left Atrium: Left atrial size was normal in size. Right Atrium: Right atrial size was normal in size. Pericardium: There is no evidence of pericardial effusion. Mitral Valve: The mitral valve is degenerative in appearance. There is mild thickening of the mitral valve leaflet(s). There is mild calcification of the mitral valve leaflet(s). Mild mitral annular calcification. Trivial mitral valve regurgitation. No evidence of mitral valve stenosis. Tricuspid Valve: The tricuspid valve is grossly normal. Tricuspid valve regurgitation is trivial. Aortic Valve: The aortic valve was not well visualized. Aortic valve regurgitation is not visualized. No aortic stenosis is present. Aortic valve mean gradient measures 3.0 mmHg. Aortic valve peak gradient measures 4.8 mmHg. Aortic valve area, by VTI measures 2.04 cm. Pulmonic Valve: The pulmonic valve was not well visualized. Pulmonic valve regurgitation not well assessed. Aorta: The aortic root is normal in  size and structure. Pulmonary Artery: The pulmonary artery is not well seen. Venous: The inferior vena cava is normal in size with greater than 50% respiratory variability, suggesting right atrial pressure of 3 mmHg. IAS/Shunts: The interatrial septum was not well visualized.  LEFT VENTRICLE PLAX 2D LVIDd:         5.38 cm  Diastology LVIDs:         2.94 cm  LV e' medial:    6.09 cm/s LV PW:         1.02 cm  LV E/e' medial:  10.3 LV IVS:        0.73 cm  LV e' lateral:   11.40 cm/s LVOT diam:     2.10 cm  LV E/e' lateral: 5.5 LV SV:         45 LV SV Index:   27 LVOT Area:     3.46 cm  RIGHT VENTRICLE RV Basal diam:  4.23 cm RV S prime:     9.46 cm/s TAPSE (M-mode): 2.0 cm LEFT ATRIUM             Index       RIGHT ATRIUM           Index LA diam:        3.50 cm 2.08 cm/m  RA Area:  16.50 cm LA Vol (A2C):   30.0 ml 17.81 ml/m RA Volume:   44.10 ml  26.18 ml/m LA Vol (A4C):   39.3 ml 23.33 ml/m LA Biplane Vol: 35.6 ml 21.13 ml/m  AORTIC VALVE                   PULMONIC VALVE AV Area (Vmax):    1.89 cm    PV Vmax:        0.88 m/s AV Area (Vmean):   2.10 cm    PV Peak grad:   3.1 mmHg AV Area (VTI):     2.04 cm    RVOT Peak grad: 3 mmHg AV Vmax:           110.00 cm/s AV Vmean:          74.000 cm/s AV VTI:            0.219 m AV Peak Grad:      4.8 mmHg AV Mean Grad:      3.0 mmHg LVOT Vmax:         60.10 cm/s LVOT Vmean:        44.800 cm/s LVOT VTI:          0.129 m LVOT/AV VTI ratio: 0.59  AORTA Ao Root diam: 3.60 cm MITRAL VALVE               TRICUSPID VALVE MV Area (PHT): 4.86 cm    TR Peak grad:   11.8 mmHg MV Decel Time: 156 msec    TR Vmax:        172.00 cm/s MV E velocity: 62.60 cm/s MV A velocity: 86.50 cm/s  SHUNTS MV E/A ratio:  0.72        Systemic VTI:  0.13 m                            Systemic Diam: 2.10 cm Nelva Bush MD Electronically signed by Nelva Bush MD Signature Date/Time: 08/28/2020/9:56:00 AM    Final       ASSESSMENT/PLAN   Acute hypoxemic respiratory failure - present on  admission -patient initially required 7L/min supplemental O2 - COVID19 negative  - supplemental O2 during my evaluation 2L/min  - will perform infectious workup for pneumonia -serum fungitell -legionella ab -strep pneumoniae ur AG -Histoplasma Ur Ag -sputum resp cultures -AFB sputum expectorated specimen -sputum cytology  -reviewed pertinent imaging with patient today -please encourage patient to use incentive spirometer few times each hour while hospitalized.     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    Patient/Family are satisfied with care plan and all questions have been answered.  This document was prepared using Dragon voice recognition software and may include unintentional dictation errors.     Ottie Glazier, M.D.  Division of [facility]

## 2020-09-18 NOTE — ED Notes (Signed)
Pt given breakfast tray

## 2020-09-18 NOTE — Consult Note (Signed)
[place] NOTE  Patient Care Team: [name], FNP as PCP - General (Family Medicine) Center, [place] [facility]) Marlowe Sax, MD as Referring Physician (Internal Medicine) Cammie Sickle, MD as Consulting Physician (Hematology and Oncology)  CHIEF COMPLAINTS/PURPOSE OF CONSULTATION: Neutropenia  HISTORY OF PRESENTING ILLNESS:  Marcus [name] 60 y.o.  male  patient with history of COPD; chronic respiratory failure on 2 L of oxygen; rheumatoid arthritis/pancytopenia severe neutropenia secondary Felty syndrome admitted to the hospital worsening shortness of breath and cough/hemoptysis.  Of note patient was recently discharged from the hospital after being treated for MRSA pneumonia/left lung abscess.  Patient was discharged on doxycycline.  However patient noted to have progressive worsening above symptoms-hence he was re-admitted.  Oncology has been consulted with regards to patient's history of neutropenia   Review of Systems  Constitutional:  Positive for fever, malaise/fatigue and weight loss. Negative for chills and diaphoresis.  HENT:  Negative for nosebleeds and sore throat.   Eyes:  Negative for double vision.  Respiratory:  Positive for cough, hemoptysis, sputum production and shortness of breath. Negative for wheezing.   Cardiovascular:  Negative for chest pain, palpitations, orthopnea and leg swelling.  Gastrointestinal:  Negative for abdominal pain, blood in stool, constipation, diarrhea, heartburn, melena, nausea and vomiting.  Genitourinary:  Negative for dysuria, frequency and urgency.  Musculoskeletal:  Negative for back pain and joint pain.  Skin: Negative.  Negative for itching and rash.  Neurological:  Positive for weakness. Negative for dizziness, tingling, focal weakness and headaches.  Endo/Heme/Allergies:  Does not bruise/bleed easily.  Psychiatric/Behavioral:  Negative for depression. The patient  is not nervous/anxious and does not have insomnia.     MEDICAL HISTORY:  Past Medical History:  Diagnosis Date   COPD (chronic obstructive pulmonary disease) (HCC)    Felty syndrome (HCC)    Hernia, epigastric    Pancytopenia (HCC)    Seropositive rheumatoid arthritis ([place])    Tobacco use disorder     SURGICAL HISTORY: Past Surgical History:  Procedure Laterality Date   INCISIONAL HERNIA REPAIR  01/17/2020   Procedure: HERNIA REPAIR INCISIONAL AND Silvestre Gunner;  Surgeon: Kinsinger, Arta Bruce, MD;  Location: [facility];  Service: General;;   LAPAROTOMY N/A 01/17/2020   Procedure: EXPLORATORY LAPAROTOMY;  Surgeon: Mickeal Skinner, MD;  Location: [facility];  Service: General;  Laterality: N/A;    SOCIAL HISTORY: Social History   Socioeconomic History   Marital status: Single    Spouse name: Not on file   Number of children: Not on file   Years of education: Not on file   Highest education level: Not on file  Occupational History   Not on file  Tobacco Use   Smoking status: Every Day    Packs/day: 1.00    Pack years: 0.00    Types: Cigarettes   Smokeless tobacco: Never  Substance and Sexual Activity   Alcohol use: No    Alcohol/week: 0.0 standard drinks   Drug use: Yes    Types: Marijuana   Sexual activity: Not on file  Other Topics Concern   Not on file  Social History Narrative   Lives at home with Mother. Ambulates independently.   Social Determinants of Health   Financial Resource Strain: Not on file  Food Insecurity: Not on file  Transportation Needs: Not on file  Physical Activity: Not on file  Stress: Not on file  Social Connections: Not on file  Intimate Partner Violence: Not  on file    FAMILY HISTORY: Family History  Problem Relation Age of Onset   CAD Brother    COPD Brother     ALLERGIES:  is allergic to levaquin [levofloxacin in d5w] and methotrexate.  MEDICATIONS:  Current Facility-Administered Medications  Medication Dose Route  Frequency Provider Last Rate Last Admin   acetaminophen (TYLENOL) tablet 650 mg  650 mg Oral Q6H PRN Para Skeans, MD       Or   acetaminophen (TYLENOL) suppository 650 mg  650 mg Rectal Q6H PRN Para Skeans, MD       benzonatate (TESSALON) capsule 200 mg  200 mg Oral BID PRN Judd Gaudier V, MD   200 mg at 09/18/20 2150   heparin injection 5,000 Units  5,000 Units Subcutaneous Q8H Florina Ou V, MD   5,000 Units at 09/18/20 2149   ipratropium-albuterol (DUONEB) 0.5-2.5 (3) MG/3ML nebulizer solution 3 mL  3 mL Nebulization Q4H PRN Athena Masse, MD   3 mL at 09/18/20 2208   nicotine (NICODERM CQ - dosed in mg/24 hours) patch 21 mg  21 mg Transdermal Daily Florina Ou V, MD   21 mg at 09/18/20 1319   predniSONE (DELTASONE) tablet 10 mg  10 mg Oral BID WC Para Skeans, MD   10 mg at 09/18/20 1808   vancomycin (VANCOREADY) IVPB 750 mg/150 mL  750 mg Intravenous Q12H Darnelle Bos, RPH 150 mL/hr at 09/18/20 1808 750 mg at 09/18/20 1808      .  PHYSICAL EXAMINATION:  Vitals:   09/18/20 1619 09/18/20 2209  BP: (!) 92/59 103/65  Pulse: 74 70  Resp: 20 18  Temp: (!) 97.4 F (36.3 C) (!) 97.3 F (36.3 C)  SpO2: 97% 93%   There were no vitals filed for this visit.  Physical Exam Constitutional:      Comments: Cachectic male patient; accompanied by his brother.  HENT:     Head: Normocephalic and atraumatic.     Mouth/Throat:     Pharynx: No oropharyngeal exudate.  Eyes:     Pupils: Pupils are equal, round, and reactive to light.  Cardiovascular:     Rate and Rhythm: Normal rate and regular rhythm.  Pulmonary:     Effort: No respiratory distress.     Breath sounds: No wheezing.     Comments: Decreased breath sounds bilaterally at bases.  No wheeze or crackles Abdominal:     General: Bowel sounds are normal. There is no distension.     Palpations: Abdomen is soft. There is no mass.     Tenderness: There is no abdominal tenderness. There is no guarding or rebound.   Musculoskeletal:        General: No tenderness. Normal range of motion.     Cervical back: Normal range of motion and neck supple.  Skin:    General: Skin is warm.  Neurological:     Mental Status: He is alert and oriented to person, place, and time.  Psychiatric:        Mood and Affect: Affect normal.     LABORATORY DATA:  I have reviewed the data as listed Lab Results  Component Value Date   WBC 4.9 09/18/2020   HGB 9.8 (L) 09/18/2020   HCT 29.7 (L) 09/18/2020   MCV 83.4 09/18/2020   PLT 203 09/18/2020   Recent Labs    11/30/19 1938 01/17/20 1454 06/10/20 2017 06/11/20 0457 09/06/20 1319 09/17/20 1345 09/18/20 0533  NA 137   < >  134*   < > 128* 131* 131*  K 3.8   < > 3.7   < > 4.2 3.3* 3.6  CL 101   < > 99   < > 91* 96* 104  CO2 27   < > 27   < > 25 28 26   GLUCOSE 85   < > 82   < > 68* 137* 72  BUN 10   < > 14   < > 19 33* 20  CREATININE 0.59*   < > 0.63   < > 0.57* 0.78 0.48*  CALCIUM 8.6*   < > 8.3*   < > 8.6* 7.9* 7.2*  GFRNONAA >60   < > >60   < > >60 >60 >60  GFRAA >60  --   --   --   --   --   --   PROT 7.8   < > 8.1   < > 7.9 6.7 5.0*  ALBUMIN 3.3*   < > 2.8*   < > 2.9* 2.4* 1.8*  AST 25   < > 39   < > 30 21 15   ALT 13   < > 16   < > 28 12 9   ALKPHOS 113   < > 119   < > 231* 276* 168*  BILITOT 0.9   < > 0.7   < > 1.1 1.0 0.8  BILIDIR  --   --  0.2  --   --   --   --   IBILI  --   --  0.5  --   --   --   --    < > = values in this interval not displayed.    RADIOGRAPHIC STUDIES: I have personally reviewed the radiological images as listed and agreed with the findings in the report. DG Chest 2 View  Result Date: 09/06/2020 CLINICAL DATA:  Shortness of breath.  Cough. EXAM: CHEST - 2 VIEW COMPARISON:  Chest x-ray 08/28/2020.  CT 08/22/2020. FINDINGS: Mediastinum unremarkable. Heart size normal. Cavitary process in the right mid lung again noted. No change identified. No pleural effusion or pneumothorax. Old right rib fractures. IMPRESSION: Cavitary  process in the right mid lung again noted. Cavitary process best identified by prior CT. Chest is unchanged from prior exam. Electronically Signed   By: Marcello Moores  Register   On: 09/06/2020 14:04   CT Chest Wo Contrast  Result Date: 08/22/2020 CLINICAL DATA:  Abnormal x-ray. Interstitial infiltrates with possible right perihilar cavitation. Cough and shortness of breath. EXAM: CT CHEST WITHOUT CONTRAST TECHNIQUE: Multidetector CT imaging of the chest was performed following the standard protocol without IV contrast. COMPARISON:  Radiographs 08/22/2020 and 08/04/2020.  CT 06/10/2020. FINDINGS: Cardiovascular: Extensive three-vessel coronary artery atherosclerosis with lesser involvement of the aorta and great vessels. The heart size is normal. There is no pericardial effusion. Mediastinum/Nodes: Multiple small mediastinal and hilar lymph nodes have enlarged compared with the chest CT of 2 months ago, likely reactive. No axillary lymphadenopathy. The thyroid gland, trachea and esophagus demonstrate no significant findings. Lungs/Pleura: There is no pleural effusion or pneumothorax. Moderate centrilobular and paraseptal emphysema. As seen on the earlier radiographs, there is a new cavitary mass inferomedially in the right upper lobe, measuring up to 7.8 x 5.5 cm on image 79/4. This has irregular thick walls and associated air-fluid levels. There was no abnormality in this area on the relatively recent prior chest CT, and this is most consistent with cavitating pneumonia. There is an additional  cavitary lesion with an air-fluid level medially in the left upper lobe, measuring up to 2.4 cm on image 65/4. There is mild patchy airspace disease surrounding these cavitary infiltrates. Upper abdomen: Small calcified gallstones. No evidence of biliary dilatation or adrenal mass. Postsurgical changes in the anterior abdominal wall consistent with previous hernia repair. Musculoskeletal/Chest wall: There is no chest wall mass  or suspicious osseous finding. There are old fractures of the right 5th and 6th ribs posterolaterally which appear unchanged. IMPRESSION: 1. CT confirms the presence of a cavitary infiltrate in the right upper lobe and shows a smaller area of cavitation anteriorly in the left upper lobe. No abnormalities were present in these areas on CT performed 2 months ago to suggest cavitary neoplasm, and these findings are consistent with cavitating/necrotizing pneumonia. 2. Mildly enlarged mediastinal and hilar lymph nodes, likely reactive. 3. No associated pleural disease. 4. Cholelithiasis. 5. Underlying moderate Emphysema (ICD10-J43.9). 6. Coronary and Aortic Atherosclerosis (ICD10-I70.0). Electronically Signed   By: Richardean Sale M.D.   On: 08/22/2020 14:26   CT Angio Chest PE W and/or Wo Contrast  Result Date: 09/17/2020 CLINICAL DATA:  Hemoptysis, shortness of breath, abdominal distension EXAM: CT ANGIOGRAPHY CHEST CT ABDOMEN AND PELVIS WITH CONTRAST TECHNIQUE: Multidetector CT imaging of the chest was performed using the standard protocol during bolus administration of intravenous contrast. Multiplanar CT image reconstructions and MIPs were obtained to evaluate the vascular anatomy. Multidetector CT imaging of the abdomen and pelvis was performed using the standard protocol during bolus administration of intravenous contrast. CONTRAST:  13mL OMNIPAQUE IOHEXOL 350 MG/ML SOLN COMPARISON:  CT 06/20/2020, 06/22/2020 FINDINGS: CTA CHEST FINDINGS Cardiovascular: Satisfactory opacification of the pulmonary arteries to the segmental level. No evidence of pulmonary embolism. Thoracic aorta is normal in course and caliber. Atherosclerotic calcifications of the aorta and coronary arteries. Normal heart size. No pericardial effusion. Mediastinum/Nodes: Mildly prominent mediastinal lymph nodes are again noted, likely reactive. No axillary or hilar lymphadenopathy. Lungs/Pleura: Redemonstrated cavitary mass located within the  inferomedial aspect of the right upper lobe measuring approximately 9.9 x 6.6 x 7.4 cm (previously measured approximately 7.8 x 4.5 x 5.5 cm on 08/22/2020. Increased surrounding consolidation and ground-glass opacity within the adjacent right upper lobe and right middle lobe previously seen cavitary lesion within the medial aspect of the left upper lobe has resolved with small thin walled pleural cyst remaining (series 6, image 44). Scattered areas of ground-glass opacity within the bilateral lower lobes and upper lobes. Background of moderate emphysema. No pleural effusion or pneumothorax. Musculoskeletal: Remote right sixth and seventh rib fractures. No new or acute osseous findings. No chest wall abnormality. Review of the MIP images confirms the above findings. CT ABDOMEN and PELVIS FINDINGS Hepatobiliary: Cholelithiasis without evidence of pericholecystic inflammation by CT. Liver within normal limits. No biliary dilatation. Pancreas: Unremarkable. No pancreatic ductal dilatation or surrounding inflammatory changes. Spleen: Prior splenectomy. Adrenals/Urinary Tract: Unremarkable adrenal glands. Kidneys enhance symmetrically without focal lesion, stone, or hydronephrosis. Ureters are nondilated. Urinary bladder appears unremarkable. Stomach/Bowel: Stomach is within normal limits. Appendix appears normal (series 2, image 58). Incidental note of a small bowel-small bowel intussusception within the mid left abdomen (series 2, image 38). No evidence of bowel wall thickening, distention, or inflammatory changes. Vascular/Lymphatic: Fusiform infrarenal abdominal aortic aneurysm measuring up to 3.1 cm, unchanged. Aortoiliac atherosclerosis. No abdominopelvic lymphadenopathy. Reproductive: Prostate is unremarkable. Other: No free fluid. No abdominopelvic fluid collection. No pneumoperitoneum. Fat containing midline supraumbilical hernia without evidence of complication. Musculoskeletal: No acute or significant osseous  findings. Review of the MIP images confirms the above findings. IMPRESSION: 1. No evidence of acute pulmonary embolism. 2. Enlarging cavitary mass within the inferomedial aspect of the right upper lobe measuring up to 9.9 cm (previously measured approximately 7.8 x 4.5 x 5.5 cm on 08/22/2020). Increased surrounding consolidation and ground-glass opacity within the adjacent right upper lobe and right middle lobe. Findings remain most compatible with cavitary/necrotizing pneumonia. 3. Scattered areas of ground-glass opacity within the bilateral lower lobes and upper lobes, concerning for multifocal infection. 4. Interval resolution of previously seen cavitary lesion within the medial aspect of the left upper lobe with small thin walled pleural cyst remaining. 5. Cholelithiasis without evidence of pericholecystic inflammation by CT. 6. Incidental note of a small bowel-small bowel intussusception within the mid left abdomen. No evidence of bowel obstruction. 7. Fat containing midline supraumbilical hernia without evidence of complication. 8. Stable infrarenal abdominal aortic aneurysm measuring up to 3.1 cm. Recommend follow-up ultrasound every 3 years. This recommendation follows ACR consensus guidelines: White Paper of the ACR Incidental Findings Committee II on Vascular Findings. J Am Coll Radiol 2013; 10:789-794. Aortic Atherosclerosis (ICD10-I70.0) and Emphysema (ICD10-J43.9). Electronically Signed   By: Davina Poke D.O.   On: 09/17/2020 16:38   MR BRAIN W WO CONTRAST  Result Date: 09/18/2020 CLINICAL DATA:  Brain mass or lesion. 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 Recent MRSA pneumonia. Patient reports visual symptoms and seizure-like activity a few weeks ago. EXAM: MRI HEAD WITHOUT AND WITH CONTRAST TECHNIQUE: Multiplanar, multiecho pulse sequences of the brain and surrounding structures were obtained without and with intravenous  contrast. CONTRAST:  59mL GADAVIST GADOBUTROL 1 MMOL/ML IV SOLN COMPARISON:  No pertinent prior exams available for comparison. FINDINGS: Brain: Intermittently motion degraded examination, limiting evaluation. Most notably, there is moderate/severe motion degradation of the sagittal T1 weighted sequence, moderate/severe motion degradation of the axial T2/FLAIR sequence, moderate motion degradation of the axial T1 weighted postcontrast sequence and moderate motion degradation of the coronal T1 weighted postcontrast sequence. Mild generalized cerebral and cerebellar atrophy. Advanced patchy and confluent T2/FLAIR hyperintensity within the cerebral white matter, nonspecific but most often secondary to chronic small vessel ischemia. Chronic lacunar infarct within the left lentiform nucleus. Small foci of SWI signal loss within the left basal ganglia and along the margin of the posterior right lateral ventricle, likely reflecting chronic microhemorrhages. Additional subcentimeter focus of SWI signal loss within the midline cerebellum, which may reflect an additional chronic microhemorrhage or cavernoma. There is no acute infarct. No evidence of intracranial mass. No extra-axial fluid collection. No midline shift. Within the limitations of motion degradation, no abnormal intracranial enhancement is identified. Vascular: Expected proximal arterial flow voids. Non dominant intracranial right vertebral artery. Skull and upper cervical spine: Within the limitations of motion degradation, no focal marrow lesion is identified. Sinuses/Orbits: Visualized orbits show no acute finding. 1.9 cm right maxillary sinus mucous retention cyst. Trace left maxillary sinus mucosal thickening. Other: Trace bilateral mastoid effusions. 2.8 cm ovoid lesion within the left suboccipital scalp demonstrating heterogeneous but predominantly T2 hyperintense signal, T1 hypointense signal, restricted diffusion and no convincing abnormal enhancement.  This is favored to reflect a sebaceous/epidermoid cyst. IMPRESSION: Motion degraded examination, as described and limiting evaluation. No evidence of acute intracranial abnormality. Severe patchy and confluent T2/FLAIR hyperintensity within the cerebral white matter, nonspecific but most often secondary to chronic small vessel ischemia. Chronic left basal ganglia lacunar infarct. Subcentimeter focus of SWI signal loss within the  midline cerebellum, which may reflect a chronic microhemorrhage or cavernoma. Mild generalized parenchymal atrophy. Incidentally noted 1.9 cm right maxillary sinus mucous retention cyst. Trace bilateral mastoid effusions. Electronically Signed   By: Kellie Simmering DO   On: 09/18/2020 15:59   CT ABDOMEN PELVIS W CONTRAST  Result Date: 09/17/2020 CLINICAL DATA:  Hemoptysis, shortness of breath, abdominal distension EXAM: CT ANGIOGRAPHY CHEST CT ABDOMEN AND PELVIS WITH CONTRAST TECHNIQUE: Multidetector CT imaging of the chest was performed using the standard protocol during bolus administration of intravenous contrast. Multiplanar CT image reconstructions and MIPs were obtained to evaluate the vascular anatomy. Multidetector CT imaging of the abdomen and pelvis was performed using the standard protocol during bolus administration of intravenous contrast. CONTRAST:  59mL OMNIPAQUE IOHEXOL 350 MG/ML SOLN COMPARISON:  CT 06/20/2020, 06/22/2020 FINDINGS: CTA CHEST FINDINGS Cardiovascular: Satisfactory opacification of the pulmonary arteries to the segmental level. No evidence of pulmonary embolism. Thoracic aorta is normal in course and caliber. Atherosclerotic calcifications of the aorta and coronary arteries. Normal heart size. No pericardial effusion. Mediastinum/Nodes: Mildly prominent mediastinal lymph nodes are again noted, likely reactive. No axillary or hilar lymphadenopathy. Lungs/Pleura: Redemonstrated cavitary mass located within the inferomedial aspect of the right upper lobe  measuring approximately 9.9 x 6.6 x 7.4 cm (previously measured approximately 7.8 x 4.5 x 5.5 cm on 08/22/2020. Increased surrounding consolidation and ground-glass opacity within the adjacent right upper lobe and right middle lobe previously seen cavitary lesion within the medial aspect of the left upper lobe has resolved with small thin walled pleural cyst remaining (series 6, image 44). Scattered areas of ground-glass opacity within the bilateral lower lobes and upper lobes. Background of moderate emphysema. No pleural effusion or pneumothorax. Musculoskeletal: Remote right sixth and seventh rib fractures. No new or acute osseous findings. No chest wall abnormality. Review of the MIP images confirms the above findings. CT ABDOMEN and PELVIS FINDINGS Hepatobiliary: Cholelithiasis without evidence of pericholecystic inflammation by CT. Liver within normal limits. No biliary dilatation. Pancreas: Unremarkable. No pancreatic ductal dilatation or surrounding inflammatory changes. Spleen: Prior splenectomy. Adrenals/Urinary Tract: Unremarkable adrenal glands. Kidneys enhance symmetrically without focal lesion, stone, or hydronephrosis. Ureters are nondilated. Urinary bladder appears unremarkable. Stomach/Bowel: Stomach is within normal limits. Appendix appears normal (series 2, image 58). Incidental note of a small bowel-small bowel intussusception within the mid left abdomen (series 2, image 38). No evidence of bowel wall thickening, distention, or inflammatory changes. Vascular/Lymphatic: Fusiform infrarenal abdominal aortic aneurysm measuring up to 3.1 cm, unchanged. Aortoiliac atherosclerosis. No abdominopelvic lymphadenopathy. Reproductive: Prostate is unremarkable. Other: No free fluid. No abdominopelvic fluid collection. No pneumoperitoneum. Fat containing midline supraumbilical hernia without evidence of complication. Musculoskeletal: No acute or significant osseous findings. Review of the MIP images confirms  the above findings. IMPRESSION: 1. No evidence of acute pulmonary embolism. 2. Enlarging cavitary mass within the inferomedial aspect of the right upper lobe measuring up to 9.9 cm (previously measured approximately 7.8 x 4.5 x 5.5 cm on 08/22/2020). Increased surrounding consolidation and ground-glass opacity within the adjacent right upper lobe and right middle lobe. Findings remain most compatible with cavitary/necrotizing pneumonia. 3. Scattered areas of ground-glass opacity within the bilateral lower lobes and upper lobes, concerning for multifocal infection. 4. Interval resolution of previously seen cavitary lesion within the medial aspect of the left upper lobe with small thin walled pleural cyst remaining. 5. Cholelithiasis without evidence of pericholecystic inflammation by CT. 6. Incidental note of a small bowel-small bowel intussusception within the mid left abdomen. No evidence of  bowel obstruction. 7. Fat containing midline supraumbilical hernia without evidence of complication. 8. Stable infrarenal abdominal aortic aneurysm measuring up to 3.1 cm. Recommend follow-up ultrasound every 3 years. This recommendation follows ACR consensus guidelines: White Paper of the ACR Incidental Findings Committee II on Vascular Findings. J Am Coll Radiol 2013; 10:789-794. Aortic Atherosclerosis (ICD10-I70.0) and Emphysema (ICD10-J43.9). Electronically Signed   By: Davina Poke D.O.   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 There is increase in consolidation in this area compared to most recent study. Lungs elsewhere clear. Heart size and pulmonary vascular normal. No adenopathy appreciable. No bone lesions. IMPRESSION: Cavitary mass  right middle lobe anteriorly with increase in surrounding consolidation compared to most recent study. The lungs elsewhere are clear. Heart size normal. No adenopathy appreciable by radiography. Electronically Signed   By: Lowella Grip III M.D.   On: 09/17/2020 13:54   DG Chest Port 1 View  Result Date: 08/28/2020 CLINICAL DATA:  Hypoxia. EXAM: PORTABLE CHEST 1 VIEW COMPARISON:  CT 08/22/2020.  Chest x-ray 08/22/2020. FINDINGS: Mediastinum hilar structures normal. Heart size normal. Cavitary process in the right mid lung and left upper lung best identified by prior CT. No new findings are noted. No pleural effusion or pneumothorax. Heart size stable. Degenerative change thoracic spine. Old right rib fractures present. IMPRESSION: Cavitary process in the right mid lung and left upper lung best identified by prior CT. Chest is unchanged from prior exam. Electronically Signed   By: Marcello Moores  Register   On: 08/28/2020 08:25   DG Chest Portable 1 View  Result Date: 08/22/2020 CLINICAL DATA:  Shortness of breath EXAM: PORTABLE CHEST 1 VIEW COMPARISON:  August 04, 2020 FINDINGS: There is apparent scarring in the right perihilar region. There is suggestion of cavitation in this area in the right perihilar region. Slight scarring lateral right base. The lungs elsewhere are clear. Heart size and pulmonary vascularity are normal. No appreciable adenopathy. No bone lesions. IMPRESSION: Scarring right mid lung. Suggestion of developing cavitation in this area which could be due to overlapping of areas of scarring and normal structures. Given concern for cavitation in this area, noncontrast chest CT advised to further evaluate. Slight scarring lateral right base. Lungs elsewhere are clear. Heart size normal. No adenopathy evident by radiography. Electronically Signed   By: Lowella Grip III M.D.   On: 08/22/2020 13:24   DG Swallowing Func-Speech Pathology  Result Date: 09/06/2020 Please refer to "Notes" tab for  Speech Pathology notes.  ECHOCARDIOGRAM COMPLETE  Result Date: 08/28/2020    ECHOCARDIOGRAM REPORT   Patient Name:   JOANDY BURGET [facility] Date of Exam: 08/28/2020 Medical Rec #:  [id number]    Height:       66.0 in Accession #:    [id number]   Weight:       133.6 lb Date of Birth:  1960/08/01     BSA:          1.685 m Patient Age:    41 years     BP:           120/74 mmHg Patient Gender: M            HR:  76 bpm. 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 COPD. Tobacco use disorder.  Sonographer:     Sherrie Sport RDCS (AE) Referring Phys:  [id number] Sharen Hones Diagnosing Phys: Nelva Bush MD  Sonographer Comments: Technically challenging study due to limited acoustic windows. Image acquisition challenging due to COPD. IMPRESSIONS  1. Left ventricular ejection fraction, by estimation, is 55 to 60%. The left ventricle has normal function. Left ventricular endocardial border not optimally defined to evaluate regional wall motion. Left ventricular diastolic parameters are consistent with Grade I diastolic dysfunction (impaired relaxation).  2. Right ventricular systolic function is normal. The right ventricular size is mildly enlarged. Mildly increased right ventricular wall thickness. There is normal pulmonary artery systolic pressure.  3. The mitral valve is degenerative. Trivial mitral valve regurgitation. No evidence of mitral stenosis.  4. The aortic valve was not well visualized. Aortic valve regurgitation is not visualized. No aortic stenosis is present.  5. Pulmonic valve regurgitation not well assessed.  6. The inferior vena cava is normal in size with greater than 50% respiratory variability, suggesting right atrial pressure of 3 mmHg. Conclusion(s)/Recommendation(s): Valves suboptimally imaged to exclude endocarditis. If  clinical concern persists, transesophageal echocardiogram should be considered. FINDINGS  Left Ventricle: Left ventricular ejection fraction, by estimation, is 55 to 60%. The left ventricle has normal function. Left ventricular endocardial border not optimally defined to evaluate regional wall motion. The left ventricular internal cavity size was normal in size. There is no left ventricular hypertrophy. Left ventricular diastolic parameters are consistent with Grade I diastolic dysfunction (impaired relaxation). Right Ventricle: The right ventricular size is mildly enlarged. Mildly increased right ventricular wall thickness. Right ventricular systolic function is normal. There is normal pulmonary artery systolic pressure. The tricuspid regurgitant velocity is 1.72 m/s, and with an assumed right atrial pressure of 3 mmHg, the estimated right ventricular systolic pressure is 86.7 mmHg. Left Atrium: Left atrial size was normal in size. Right Atrium: Right atrial size was normal in size. Pericardium: There is no evidence of pericardial effusion. Mitral Valve: The mitral valve is degenerative in appearance. There is mild thickening of the mitral valve leaflet(s). There is mild calcification of the mitral valve leaflet(s). Mild mitral annular calcification. Trivial mitral valve regurgitation. No evidence of mitral valve stenosis. Tricuspid Valve: The tricuspid valve is grossly normal. Tricuspid valve regurgitation is trivial. Aortic Valve: The aortic valve was not well visualized. Aortic valve regurgitation is not visualized. No aortic stenosis is present. Aortic valve mean gradient measures 3.0 mmHg. Aortic valve peak gradient measures 4.8 mmHg. Aortic valve area, by VTI measures 2.04 cm. Pulmonic Valve: The pulmonic valve was not well visualized. Pulmonic valve regurgitation not well assessed. Aorta: The aortic root is normal in size and structure. Pulmonary Artery: The pulmonary artery is not well seen. Venous: The  inferior vena cava is normal in size with greater than 50% respiratory variability, suggesting right atrial pressure of 3 mmHg. IAS/Shunts: The interatrial septum was not well visualized.  LEFT VENTRICLE PLAX 2D LVIDd:         5.38 cm  Diastology LVIDs:         2.94 cm  LV e' medial:    6.09 cm/s LV PW:  1.02 cm  LV E/e' medial:  10.3 LV IVS:        0.73 cm  LV e' lateral:   11.40 cm/s LVOT diam:     2.10 cm  LV E/e' lateral: 5.5 LV SV:         45 LV SV Index:   27 LVOT Area:     3.46 cm  RIGHT VENTRICLE RV Basal diam:  4.23 cm RV S prime:     9.46 cm/s TAPSE (M-mode): 2.0 cm LEFT ATRIUM             Index       RIGHT ATRIUM           Index LA diam:        3.50 cm 2.08 cm/m  RA Area:     16.50 cm LA Vol (A2C):   30.0 ml 17.81 ml/m RA Volume:   44.10 ml  26.18 ml/m LA Vol (A4C):   39.3 ml 23.33 ml/m LA Biplane Vol: 35.6 ml 21.13 ml/m  AORTIC VALVE                   PULMONIC VALVE AV Area (Vmax):    1.89 cm    PV Vmax:        0.88 m/s AV Area (Vmean):   2.10 cm    PV Peak grad:   3.1 mmHg AV Area (VTI):     2.04 cm    RVOT Peak grad: 3 mmHg AV Vmax:           110.00 cm/s AV Vmean:          74.000 cm/s AV VTI:            0.219 m AV Peak Grad:      4.8 mmHg AV Mean Grad:      3.0 mmHg LVOT Vmax:         60.10 cm/s LVOT Vmean:        44.800 cm/s LVOT VTI:          0.129 m LVOT/AV VTI ratio: 0.59  AORTA Ao Root diam: 3.60 cm MITRAL VALVE               TRICUSPID VALVE MV Area (PHT): 4.86 cm    TR Peak grad:   11.8 mmHg MV Decel Time: 156 msec    TR Vmax:        172.00 cm/s MV E velocity: 62.60 cm/s MV A velocity: 86.50 cm/s  SHUNTS MV E/A ratio:  0.72        Systemic VTI:  0.13 m                            Systemic Diam: 2.10 cm Nelva Bush MD Electronically signed by Nelva Bush MD Signature Date/Time: 08/28/2020/9:56:00 AM    Final     Acquired neutropenia [facility]) #60 year old male patient with history of rheumatoid arthritis/Felty syndrome; currently admitted for worsening lower abscess/MRSA    #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  White count 3.7; absolute neutrophil count 1.5.    #Severe rheumatoid arthritis/Felty syndrome  #History of splenectomy   #MRSA-lung abscess s/p IV vancomycin-worsening noted question underlying fungal infection   Recommendations:  #Patient is immunosuppressed given chronic severe neutropenia/use of steroids/splenectomy.  Recommend Granix to keep ANC > 1.7.  Await labs from tomorrow a.m. to start Granix. Discussed with Dr.Alekerov.  Thank you Dr. Harrell Gave for allowing  me to participate in the care of your pleasant patient. Please do not hesitate to contact me with questions or concerns in the interim.  All questions were answered. The patient knows to call the clinic with any problems, questions or concerns.    Cammie Sickle, MD 09/18/2020 10:40 PM

## 2020-09-18 NOTE — ED Notes (Signed)
Dr. Lanney Gins at bedside

## 2020-09-18 NOTE — ED Notes (Signed)
Breakfast tray requested for patient.

## 2020-09-18 NOTE — Consult Note (Signed)
NAME: Marcus [name]  DOB: 05/16/1960  MRN: [id number]  Date/Time: 09/18/2020 9:18 AM  REQUESTING PROVIDER: danford Subjective:  REASON FOR CONSULT: MRSA lung abscess ? Marcus [name] is a 60 y.o. male with a history of COPD, Felty syndrome, seropositive rheumatoid arthritis, pancytopenia, being treated for MRSA cavitating pneumonia presents to the ED on 10/04/2020 with shortness of breath and coughing blood.  Patient at home was on 2 L oxygen.  Patient has had multiple admissions for COPD in the recent past.  He was hospitalized between 08/22/2020 until 08/30/2020 for cough and shortness of breath going on for a month.  On 08/22/2020 he had a CT scan that showed a cavitary lesion in the right upper lobe concerning for lung abscess.  His sputum was positive for MRSA.  He initially was treated with IV vancomycin and discharged on p.o. doxycycline for 6 weeks.  He was seen by ID at that admission   I am seeing this patient for antibiotic management. 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 Pt continues to smoke  Past Medical History:  Diagnosis Date   COPD (chronic obstructive pulmonary disease) (HCC)    Felty syndrome (HCC)    Hernia, epigastric    Pancytopenia (HCC)    Seropositive rheumatoid arthritis ([place])    Tobacco use disorder     Past Surgical History:  Procedure Laterality Date   INCISIONAL HERNIA REPAIR  01/17/2020   Procedure: HERNIA REPAIR INCISIONAL AND Silvestre Gunner;  Surgeon: Kinsinger, Arta Bruce, MD;  Location: [facility];  Service: General;;   LAPAROTOMY N/A 01/17/2020   Procedure: EXPLORATORY LAPAROTOMY;  Surgeon: Mickeal Skinner, MD;  Location: [facility];  Service: General;  Laterality: N/A;    Social History   Socioeconomic History   Marital status: Single    Spouse name: Not on file   Number of children: Not on file   Years of education: Not on file   Highest education level: Not on file  Occupational History   Not on file  Tobacco Use   Smoking status: Every  Day    Packs/day: 1.00    Pack years: 0.00    Types: Cigarettes   Smokeless tobacco: Never  Substance and Sexual Activity   Alcohol use: No    Alcohol/week: 0.0 standard drinks   Drug use: Yes    Types: Marijuana   Sexual activity: Not on file  Other Topics Concern   Not on file  Social History Narrative   Lives at home with Mother. Ambulates independently.   Social Determinants of Health   Financial Resource Strain: Not on file  Food Insecurity: Not on file  Transportation Needs: Not on file  Physical Activity: Not on file  Stress: Not on file  Social Connections: Not on file  Intimate Partner Violence: Not on file    Family History  Problem Relation Age of Onset   CAD Brother    COPD Brother    Allergies  Allergen Reactions   Levaquin [Levofloxacin In D5w] Other (See Comments)    Chest pain   Methotrexate Other (See Comments)    Chest pain   I? Current Facility-Administered Medications  Medication Dose Route Frequency Provider Last Rate Last Admin   acetaminophen (TYLENOL) tablet 650 mg  650 mg Oral Q6H PRN Para Skeans, MD       Or   acetaminophen (TYLENOL) suppository 650 mg  650 mg Rectal Q6H PRN Para Skeans, MD       heparin injection  5,000 Units  5,000 Units Subcutaneous Q8H Florina Ou V, MD   5,000 Units at 09/18/20 0542   nicotine (NICODERM CQ - dosed in mg/24 hours) patch 21 mg  21 mg Transdermal Daily Florina Ou V, MD   21 mg at 09/17/20 2151   pantoprazole (PROTONIX) injection 40 mg  40 mg Intravenous Q24H Florina Ou V, MD   40 mg at 09/17/20 2151   piperacillin-tazobactam (ZOSYN) IVPB 3.375 g  3.375 g Intravenous Q8H Virl Cagey E, RPH 12.5 mL/hr at 09/18/20 0542 3.375 g at 09/18/20 0542   predniSONE (DELTASONE) tablet 10 mg  10 mg Oral BID WC Florina Ou V, MD   10 mg at 09/18/20 0838   vancomycin (VANCOREADY) IVPB 750 mg/150 mL  750 mg Intravenous Q12H Darnelle Bos, [facility]   Stopped at 09/18/20 4270   Current Outpatient Medications   Medication Sig Dispense Refill   albuterol (PROVENTIL) (2.5 MG/3ML) 0.083% nebulizer solution Take 3 mLs (2.5 mg total) by nebulization every 6 (six) hours as needed for wheezing or shortness of breath. 75 mL 1   albuterol (VENTOLIN HFA) 108 (90 Base) MCG/ACT inhaler Inhale 2 puffs into the lungs every 6 (six) hours as needed for wheezing or shortness of breath. 8 g 2   doxycycline (VIBRA-TABS) 100 MG tablet Take 1 tablet (100 mg total) by mouth every 12 (twelve) hours. 84 tablet 0   predniSONE (DELTASONE) 10 MG tablet Take 1 tablet (10 mg total) by mouth 2 (two) times daily with a meal. 60 tablet 1   feeding supplement (ENSURE ENLIVE / ENSURE PLUS) LIQD Take 237 mLs by mouth 2 (two) times daily between meals. 237 mL 12   guaiFENesin-dextromethorphan (ROBITUSSIN DM) 100-10 MG/5ML syrup Take 5 mLs by mouth every 4 (four) hours as needed for cough. (Patient not taking: No sig reported) 118 mL 0   mometasone-formoterol (DULERA) 100-5 MCG/ACT AERO Inhale 2 puffs into the lungs 2 (two) times daily. (Patient not taking: Reported on 09/17/2020) 1 each 0   pantoprazole (PROTONIX) 40 MG tablet Take 1 tablet (40 mg total) by mouth daily for 10 days. (Patient not taking: Reported on 09/17/2020) 10 tablet 0     Abtx:  Anti-infectives (From admission, onward)    Start     Dose/Rate Route Frequency Ordered Stop   09/18/20 0600  vancomycin (VANCOREADY) IVPB 750 mg/150 mL        750 mg 150 mL/hr over 60 Minutes Intravenous Every 12 hours 09/17/20 1950     09/17/20 2200  piperacillin-tazobactam (ZOSYN) IVPB 3.375 g        3.375 g 12.5 mL/hr over 240 Minutes Intravenous Every 8 hours 09/17/20 2019     09/17/20 2000  piperacillin-tazobactam (ZOSYN) IVPB 3.375 g  Status:  Discontinued        3.375 g 100 mL/hr over 30 Minutes Intravenous Every 6 hours 09/17/20 1907 09/17/20 2019   09/17/20 1930  vancomycin (VANCOREADY) IVPB 500 mg/100 mL        500 mg 100 mL/hr over 60 Minutes Intravenous  Once 09/17/20 1924  09/17/20 2346   09/17/20 1545  metroNIDAZOLE (FLAGYL) IVPB 500 mg        500 mg 100 mL/hr over 60 Minutes Intravenous  Once 09/17/20 1533 09/17/20 1724   09/17/20 1515  vancomycin (VANCOCIN) IVPB 1000 mg/200 mL premix        1,000 mg 200 mL/hr over 60 Minutes Intravenous  Once 09/17/20 1505 09/17/20 1742   09/17/20 1515  ceFEPIme (MAXIPIME)  2 g in sodium chloride 0.9 % 100 mL IVPB        2 g 200 mL/hr over 30 Minutes Intravenous  Once 09/17/20 1505 09/17/20 1619       REVIEW OF SYSTEMS:  Const: negative fever, negative chills,  positive weight loss Eyes: negative diplopia or visual changes, negative eye pain ENT: negative coryza, negative sore throat Resp:  cough, hemoptysis, dyspnea Cards: +chest pain, palpitations, lower extremity edema GU: negative for frequency, dysuria and hematuria GI: Negative for abdominal pain, diarrhea, bleeding, constipation Skin: negative for rash and pruritus Heme: negative for easy bruising and gum/nose bleeding MS: generalized weakness Neurolo:negative for headaches, dizziness, vertigo, memory problems  Psych: negative for feelings of anxiety, depression  Endocrine: negative for thyroid, diabetes Allergy/Immunology- as above: Objective:  VITALS:  BP 108/67   Pulse 92   Temp 98.8 F (37.1 C) (Oral)   Resp (!) 31   SpO2 100%  PHYSICAL EXAM:  General: Alert, cooperative, some  distress, chronically ill Head: Normocephalic, without obvious abnormality, atraumatic. Eyes: Conjunctivae clear, anicteric sclerae. Pupils are equal ENT Nares normal. No drainage or sinus tenderness. Lips, mucosa, and tongue normal. No Thrush Poor dentition Neck: Supple, symmetrical, no adenopathy, thyroid: non tender no carotid bruit and no JVD. Back: No CVA tenderness. Lungs: b/l air entry- rhonchi rt side Heart: Regular rate and rhythm, no murmur, rub or gallop. Abdomen: lap scar Soft, non-tender,not distended. Bowel sounds normal. No masses Extremities:  atraumatic, no cyanosis. No edema. No clubbing Skin: No rashes or lesions. Or bruising Lymph: Cervical, supraclavicular normal. Neurologic: Grossly non-focal Pertinent Labs Lab Results CBC    Component Value Date/Time   WBC 4.9 09/18/2020 0533   RBC 3.56 (L) 09/18/2020 0533   HGB 9.8 (L) 09/18/2020 0533   HCT 29.7 (L) 09/18/2020 0533   PLT 203 09/18/2020 0533   MCV 83.4 09/18/2020 0533   MCH 27.5 09/18/2020 0533   MCHC 33.0 09/18/2020 0533   RDW 18.4 (H) 09/18/2020 0533   LYMPHSABS 1.1 09/17/2020 1345   MONOABS 0.5 09/17/2020 1345   EOSABS 0.0 09/17/2020 1345   BASOSABS 0.0 09/17/2020 1345    CMP Latest Ref Rng & Units 09/18/2020 09/17/2020 09/06/2020  Glucose 70 - 99 mg/dL 72 137(H) 68(L)  BUN 6 - 20 mg/dL 20 33(H) 19  Creatinine 0.61 - 1.24 mg/dL 0.48(L) 0.78 0.57(L)  Sodium 135 - 145 mmol/L 131(L) 131(L) 128(L)  Potassium 3.5 - 5.1 mmol/L 3.6 3.3(L) 4.2  Chloride 98 - 111 mmol/L 104 96(L) 91(L)  CO2 22 - 32 mmol/L 26 28 25   Calcium 8.9 - 10.3 mg/dL 7.2(L) 7.9(L) 8.6(L)  Total Protein 6.5 - 8.1 g/dL 5.0(L) 6.7 7.9  Total Bilirubin 0.3 - 1.2 mg/dL 0.8 1.0 1.1  Alkaline Phos 38 - 126 U/L 168(H) 276(H) 231(H)  AST 15 - 41 U/L 15 21 30   ALT 0 - 44 U/L 9 12 28       Microbiology: Recent Results (from the past 240 hour(s))  Blood culture (routine x 2)     Status: None (Preliminary result)   Collection Time: 09/17/20  1:58 PM   Specimen: BLOOD  Result Value Ref Range Status   Specimen Description BLOOD BLOOD LEFT FOREARM  Final   Special Requests   Final    BOTTLES DRAWN AEROBIC AND ANAEROBIC Blood Culture adequate volume   Culture   Final    NO GROWTH < 24 HOURS Performed at [facility], [address] [place], [place] [address]  Report Status PENDING  Incomplete  Blood culture (routine x 2)     Status: None (Preliminary result)   Collection Time: 09/17/20  1:59 PM   Specimen: BLOOD  Result Value Ref Range Status   Specimen Description BLOOD BLOOD  RIGHT FOREARM  Final   Special Requests   Final    BOTTLES DRAWN AEROBIC AND ANAEROBIC Blood Culture results may not be optimal due to an excessive volume of blood received in culture bottles   Culture   Final    NO GROWTH < 24 HOURS Performed at [facility], [address], [place], [place] [address]    Report Status PENDING  Incomplete  Resp Panel by RT-PCR (Flu A&B, Covid) Nasopharyngeal Swab     Status: None   Collection Time: 09/17/20  2:14 PM   Specimen: Nasopharyngeal Swab; Nasopharyngeal(NP) swabs in vial transport medium  Result Value Ref Range Status   SARS Coronavirus 2 by RT PCR NEGATIVE NEGATIVE Final    Comment: (NOTE) SARS-CoV-2 target nucleic acids are NOT DETECTED.  The SARS-CoV-2 RNA is generally detectable in upper respiratory specimens during the acute phase of infection. The lowest concentration of SARS-CoV-2 viral copies this assay can detect is 138 copies/mL. A negative result does not preclude SARS-Cov-2 infection and should not be used as the sole basis for treatment or other patient management decisions. A negative result may occur with  improper specimen collection/handling, submission of specimen other than nasopharyngeal swab, presence of viral mutation(s) within the areas targeted by this assay, and inadequate number of viral copies(<138 copies/mL). A negative result must be combined with clinical observations, patient history, and epidemiological information. The expected result is Negative.  Fact Sheet for Patients:  EntrepreneurPulse.com.au  Fact Sheet for Healthcare Providers:  IncredibleEmployment.be  This test is no t yet approved or cleared by the [place] FDA and  has been authorized for detection and/or diagnosis of SARS-CoV-2 by FDA under an Emergency Use Authorization (EUA). This EUA will remain  in effect (meaning this test can be used) for the duration of the COVID-19 declaration under  Section 564(b)(1) of the Act, 21 U.S.C.section 360bbb-3(b)(1), unless the authorization is terminated  or revoked sooner.       Influenza A by PCR NEGATIVE NEGATIVE Final   Influenza B by PCR NEGATIVE NEGATIVE Final    Comment: (NOTE) The Xpert Xpress SARS-CoV-2/FLU/RSV plus assay is intended as an aid in the diagnosis of influenza from Nasopharyngeal swab specimens and should not be used as a sole basis for treatment. Nasal washings and aspirates are unacceptable for Xpert Xpress SARS-CoV-2/FLU/RSV testing.  Fact Sheet for Patients: EntrepreneurPulse.com.au  Fact Sheet for Healthcare Providers: IncredibleEmployment.be  This test is not yet approved or cleared by the [place] FDA and has been authorized for detection and/or diagnosis of SARS-CoV-2 by FDA under an Emergency Use Authorization (EUA). This EUA will remain in effect (meaning this test can be used) for the duration of the COVID-19 declaration under Section 564(b)(1) of the Act, 21 U.S.C. section 360bbb-3(b)(1), unless the authorization is terminated or revoked.  Performed at [facility], [address] [place], [place] [address]     IMAGING RESULTS:  I have personally reviewed the films   ? Impression/Recommendation ? 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 ___________________________________________________ Discussed with patient, his brother  and requesting provider  Note:  This document was prepared using Dragon voice recognition software and may include unintentional dictation errors.

## 2020-09-18 NOTE — Progress Notes (Signed)
[facility] PROGRESS NOTE    Marcus [name]  QIW:[id number] DOB: 12-25-1960 DOA: 09/17/2020 PCP: [name], FNP      Brief Narrative:  [name] is a 60 y.o. M with RA c/b Felty syndrome/neutropenia, smoking, and COPD as well as recently diagnosed MRSA abscess who presents with failure of outpatient treamtent.  Patient admitted last month for 8 days for cough and shortness of breath as well as hemoptysis found to have a large cavitary lesion in the right upper lobe concerning for lung abscess.  Sputum was positive for MRSA and so he was discharged on 6 weeks of oral doxycycline.  He reports adherence to his medication after some initial improvement with IV vancomycin in the hospital, he then started to get persistently worse, finally was coughing up blood and had chest pain and malaise/fatigue, so he came to the ER.              Assessment & Plan:  Acute on chronic respiratory failure with hypoxia At baseline he is on 2 L.  His SPO2 was 80% on home oxygen, breathing >30 times per minute, here stabilized on 7 L.   Cavitary pneumonia causing sepsis Presented with tachycardia, tachypnea, leukopenia, and blood pressure less than 100.  -Continue vancomycin, cefepime, and Flagyl -Consult ID and Pulm for narrowing antibiotics and further diagnostics, appreciate cares   Neutropenia Noted during last hospitalization. -Consult Hematology, apprecaite cares   Rheumatoid arthritis - Continue prednisone twice daily  Hypokalemia - Supplemented and improved  Chronic diastolic CHF Euvolemic  Intussusception This is an apparently incidental finding, no evidence of small bowel obstruction, belly exam benign - Advance diet as tolerated -Stop PPI   Anemia of chronic disease Hgb down to 9.8 in setting of mild hemoptysis -Trend Hgb -Monitor for ongoing bleeding  Hyponatremia Mild, asymptoamtic   Smoking Cessation recommended, modalities  discussed  Severe protein calorie malnutrition As evidenced by severe loss of subcutaneous muscle mass and fat, chronic infection, smoking -Consutl dietitian     Disposition: Status is: Inpatient  Remains inpatient appropriate because: failed outpatient therapy, still on higher than baseline O2, requires ongoing diagnostic testing perhaps as dictated by Pulmonogloy  Dispo: The patient is from: Home              Anticipated d/c is to: Home              Patient currently is not medically stable to d/c.   Difficult to place patient No       Level of care: Med-Surg       MDM: The below labs and imaging reports were reviewed and summarized above.  Medication management as above.  This is a severe threat to life and bodily function    DVT prophylaxis: heparin injection 5,000 Units Start: 09/17/20 2200  Code Status: Full code Family Communication: None present           Subjective: Still sick and tired, no significant hemoptysis this morning, no confusion, vomiting.  Moderate chest discomfort, feels out of breath  Objective: Vitals:   09/18/20 1300 09/18/20 1430 09/18/20 1546 09/18/20 1619  BP: 99/61 99/65  (!) 92/59  Pulse: 83 77  74  Resp: (!) 22 (!) 27  20  Temp:    (!) 97.4 F (36.3 C)  TempSrc:    Oral  SpO2: 97% 96% 99% 97%    Intake/Output Summary (Last 24 hours) at 09/18/2020 1723 Last data filed at 09/18/2020 0739 Gross per  24 hour  Intake 150 ml  Output --  Net 150 ml   There were no vitals filed for this visit.  Examination: General appearance: Chronically ill-appearing adult male, awake and in mild respiratory distress.   HEENT: Anicteric, conjunctiva pink, lids and lashes normal. No nasal deformity, discharge, epistaxis.  Lips moist, mostly edentulous, dentition in poor repair, oropharynx dry, no oral lesions, hearing normal.   Skin: Warm and dry.  No jaundice.  No suspicious rashes or lesions. Cardiac: Tachycardic, nl S1-S2, no murmurs  appreciated.  Capillary refill is brisk.  JVP not visible.  No LE edema.  Radial pulses 2+ and symmetric. Respiratory: Normal respiratory rate and rhythm.  Wheezing bilaterally, diminished on the right, no rales. Abdomen: Abdomen soft.  No TTP or guarding. No ascites, distension, hepatosplenomegaly.   MSK: No deformities or effusions.  Diffuse loss of subcutaneous muscle mass and fat Neuro: Awake and alert.  EOMI, moves all extremities with generalized weakness. Speech fluent.    Psych: Sensorium intact and responding to questions, attention normal. Affect blunted.  Judgment and insight appear normal.    Data Reviewed: I have personally reviewed following labs and imaging studies:  CBC: Recent Labs  Lab 09/17/20 1345 09/18/20 0533  WBC 3.1* 4.9  NEUTROABS 1.5*  --   HGB 12.9* 9.8*  HCT 40.2 29.7*  MCV 84.1 83.4  PLT 190 160   Basic Metabolic Panel: Recent Labs  Lab 09/17/20 1345 09/18/20 0533  NA 131* 131*  K 3.3* 3.6  CL 96* 104  CO2 28 26  GLUCOSE 137* 72  BUN 33* 20  CREATININE 0.78 0.48*  CALCIUM 7.9* 7.2*   GFR: Estimated Creatinine Clearance: 84.8 mL/min (A) (by C-G formula based on SCr of 0.48 mg/dL (L)). Liver Function Tests: Recent Labs  Lab 09/17/20 1345 09/18/20 0533  AST 21 15  ALT 12 9  ALKPHOS 276* 168*  BILITOT 1.0 0.8  PROT 6.7 5.0*  ALBUMIN 2.4* 1.8*   No results for input(s): LIPASE, AMYLASE in the last 168 hours. No results for input(s): AMMONIA in the last 168 hours. Coagulation Profile: Recent Labs  Lab 09/17/20 1345  INR 1.1   Cardiac Enzymes: No results for input(s): CKTOTAL, CKMB, CKMBINDEX, TROPONINI in the last 168 hours. BNP (last 3 results) No results for input(s): PROBNP in the last 8760 hours. HbA1C: No results for input(s): HGBA1C in the last 72 hours. CBG: No results for input(s): GLUCAP in the last 168 hours. Lipid Profile: No results for input(s): CHOL, HDL, LDLCALC, TRIG, CHOLHDL, LDLDIRECT in the last 72  hours. Thyroid Function Tests: No results for input(s): TSH, T4TOTAL, FREET4, T3FREE, THYROIDAB in the last 72 hours. Anemia Panel: No results for input(s): VITAMINB12, FOLATE, FERRITIN, TIBC, IRON, RETICCTPCT in the last 72 hours. Urine analysis:    Component Value Date/Time   COLORURINE AMBER (A) 08/22/2020 1253   APPEARANCEUR HAZY (A) 08/22/2020 1253   LABSPEC 1.024 08/22/2020 1253   PHURINE 5.0 08/22/2020 1253   GLUCOSEU NEGATIVE 08/22/2020 1253   HGBUR NEGATIVE 08/22/2020 [place] 08/22/2020 1253   KETONESUR NEGATIVE 08/22/2020 1253   PROTEINUR NEGATIVE 08/22/2020 1253   UROBILINOGEN 0.2 10/21/2012 1737   NITRITE NEGATIVE 08/22/2020 1253   LEUKOCYTESUR NEGATIVE 08/22/2020 1253   Sepsis Labs: @LABRCNTIP (procalcitonin:4,lacticacidven:4)  ) Recent Results (from the past 240 hour(s))  Blood culture (routine x 2)     Status: None (Preliminary result)   Collection Time: 09/17/20  1:58 PM   Specimen: BLOOD  Result Value  Ref Range Status   Specimen Description BLOOD BLOOD LEFT FOREARM  Final   Special Requests   Final    BOTTLES DRAWN AEROBIC AND ANAEROBIC Blood Culture adequate volume   Culture   Final    NO GROWTH < 24 HOURS Performed at [facility], [address] [place], [place] [address]    Report Status PENDING  Incomplete  Blood culture (routine x 2)     Status: None (Preliminary result)   Collection Time: 09/17/20  1:59 PM   Specimen: BLOOD  Result Value Ref Range Status   Specimen Description BLOOD BLOOD RIGHT FOREARM  Final   Special Requests   Final    BOTTLES DRAWN AEROBIC AND ANAEROBIC Blood Culture results may not be optimal due to an excessive volume of blood received in culture bottles   Culture   Final    NO GROWTH < 24 HOURS Performed at [facility], [address] [place], [place] [address]    Report Status PENDING  Incomplete  Resp Panel by RT-PCR (Flu A&B, Covid) Nasopharyngeal Swab     Status: None    Collection Time: 09/17/20  2:14 PM   Specimen: Nasopharyngeal Swab; Nasopharyngeal(NP) swabs in vial transport medium  Result Value Ref Range Status   SARS Coronavirus 2 by RT PCR NEGATIVE NEGATIVE Final    Comment: (NOTE) SARS-CoV-2 target nucleic acids are NOT DETECTED.  The SARS-CoV-2 RNA is generally detectable in upper respiratory specimens during the acute phase of infection. The lowest concentration of SARS-CoV-2 viral copies this assay can detect is 138 copies/mL. A negative result does not preclude SARS-Cov-2 infection and should not be used as the sole basis for treatment or other patient management decisions. A negative result may occur with  improper specimen collection/handling, submission of specimen other than nasopharyngeal swab, presence of viral mutation(s) within the areas targeted by this assay, and inadequate number of viral copies(<138 copies/mL). A negative result must be combined with clinical observations, patient history, and epidemiological information. The expected result is Negative.  Fact Sheet for Patients:  EntrepreneurPulse.com.au  Fact Sheet for Healthcare Providers:  IncredibleEmployment.be  This test is no t yet approved or cleared by the [place] FDA and  has been authorized for detection and/or diagnosis of SARS-CoV-2 by FDA under an Emergency Use Authorization (EUA). This EUA will remain  in effect (meaning this test can be used) for the duration of the COVID-19 declaration under Section 564(b)(1) of the Act, 21 U.S.C.section 360bbb-3(b)(1), unless the authorization is terminated  or revoked sooner.       Influenza A by PCR NEGATIVE NEGATIVE Final   Influenza B by PCR NEGATIVE NEGATIVE Final    Comment: (NOTE) The Xpert Xpress SARS-CoV-2/FLU/RSV plus assay is intended as an aid in the diagnosis of influenza from Nasopharyngeal swab specimens and should not be used as a sole basis for treatment.  Nasal washings and aspirates are unacceptable for Xpert Xpress SARS-CoV-2/FLU/RSV testing.  Fact Sheet for Patients: EntrepreneurPulse.com.au  Fact Sheet for Healthcare Providers: IncredibleEmployment.be  This test is not yet approved or cleared by the [place] FDA and has been authorized for detection and/or diagnosis of SARS-CoV-2 by FDA under an Emergency Use Authorization (EUA). This EUA will remain in effect (meaning this test can be used) for the duration of the COVID-19 declaration under Section 564(b)(1) of the Act, 21 U.S.C. section 360bbb-3(b)(1), unless the authorization is terminated or revoked.  Performed at [facility], [address], [place], [place] [address]  MRSA PCR Screening     Status: Abnormal   Collection Time: 09/18/20  8:36 AM   Specimen: Nasopharyngeal  Result Value Ref Range Status   MRSA by PCR POSITIVE (A) NEGATIVE Final    Comment:        The GeneXpert MRSA Assay (FDA approved for NASAL specimens only), is one component of a comprehensive MRSA colonization surveillance program. It is not intended to diagnose MRSA infection nor to guide or monitor treatment for MRSA infections. RESULT CALLED TO, READ BACK BY AND VERIFIED WITH: Leota Jacobsen 1029 09/18/20 GM Performed at [facility], [address], [place], [place] [address]          Radiology Studies: CT Angio Chest PE W and/or Wo Contrast  Result Date: 09/17/2020 CLINICAL DATA:  Hemoptysis, shortness of breath, abdominal distension EXAM: CT ANGIOGRAPHY CHEST CT ABDOMEN AND PELVIS WITH CONTRAST TECHNIQUE: Multidetector CT imaging of the chest was performed using the standard protocol during bolus administration of intravenous contrast. Multiplanar CT image reconstructions and MIPs were obtained to evaluate the vascular anatomy. Multidetector CT imaging of the abdomen and pelvis was performed using the standard protocol  during bolus administration of intravenous contrast. CONTRAST:  57mL OMNIPAQUE IOHEXOL 350 MG/ML SOLN COMPARISON:  CT 06/20/2020, 06/22/2020 FINDINGS: CTA CHEST FINDINGS Cardiovascular: Satisfactory opacification of the pulmonary arteries to the segmental level. No evidence of pulmonary embolism. Thoracic aorta is normal in course and caliber. Atherosclerotic calcifications of the aorta and coronary arteries. Normal heart size. No pericardial effusion. Mediastinum/Nodes: Mildly prominent mediastinal lymph nodes are again noted, likely reactive. No axillary or hilar lymphadenopathy. Lungs/Pleura: Redemonstrated cavitary mass located within the inferomedial aspect of the right upper lobe measuring approximately 9.9 x 6.6 x 7.4 cm (previously measured approximately 7.8 x 4.5 x 5.5 cm on 08/22/2020. Increased surrounding consolidation and ground-glass opacity within the adjacent right upper lobe and right middle lobe previously seen cavitary lesion within the medial aspect of the left upper lobe has resolved with small thin walled pleural cyst remaining (series 6, image 44). Scattered areas of ground-glass opacity within the bilateral lower lobes and upper lobes. Background of moderate emphysema. No pleural effusion or pneumothorax. Musculoskeletal: Remote right sixth and seventh rib fractures. No new or acute osseous findings. No chest wall abnormality. Review of the MIP images confirms the above findings. CT ABDOMEN and PELVIS FINDINGS Hepatobiliary: Cholelithiasis without evidence of pericholecystic inflammation by CT. Liver within normal limits. No biliary dilatation. Pancreas: Unremarkable. No pancreatic ductal dilatation or surrounding inflammatory changes. Spleen: Prior splenectomy. Adrenals/Urinary Tract: Unremarkable adrenal glands. Kidneys enhance symmetrically without focal lesion, stone, or hydronephrosis. Ureters are nondilated. Urinary bladder appears unremarkable. Stomach/Bowel: Stomach is within normal  limits. Appendix appears normal (series 2, image 58). Incidental note of a small bowel-small bowel intussusception within the mid left abdomen (series 2, image 38). No evidence of bowel wall thickening, distention, or inflammatory changes. Vascular/Lymphatic: Fusiform infrarenal abdominal aortic aneurysm measuring up to 3.1 cm, unchanged. Aortoiliac atherosclerosis. No abdominopelvic lymphadenopathy. Reproductive: Prostate is unremarkable. Other: No free fluid. No abdominopelvic fluid collection. No pneumoperitoneum. Fat containing midline supraumbilical hernia without evidence of complication. Musculoskeletal: No acute or significant osseous findings. Review of the MIP images confirms the above findings. IMPRESSION: 1. No evidence of acute pulmonary embolism. 2. Enlarging cavitary mass within the inferomedial aspect of the right upper lobe measuring up to 9.9 cm (previously measured approximately 7.8 x 4.5 x 5.5 cm on 08/22/2020). Increased surrounding consolidation and ground-glass opacity within the adjacent right upper  lobe and right middle lobe. Findings remain most compatible with cavitary/necrotizing pneumonia. 3. Scattered areas of ground-glass opacity within the bilateral lower lobes and upper lobes, concerning for multifocal infection. 4. Interval resolution of previously seen cavitary lesion within the medial aspect of the left upper lobe with small thin walled pleural cyst remaining. 5. Cholelithiasis without evidence of pericholecystic inflammation by CT. 6. Incidental note of a small bowel-small bowel intussusception within the mid left abdomen. No evidence of bowel obstruction. 7. Fat containing midline supraumbilical hernia without evidence of complication. 8. Stable infrarenal abdominal aortic aneurysm measuring up to 3.1 cm. Recommend follow-up ultrasound every 3 years. This recommendation follows ACR consensus guidelines: White Paper of the ACR Incidental Findings Committee II on Vascular  Findings. J Am Coll Radiol 2013; 10:789-794. Aortic Atherosclerosis (ICD10-I70.0) and Emphysema (ICD10-J43.9). Electronically Signed   By: Davina Poke D.O.   On: 09/17/2020 16:38   MR BRAIN W WO CONTRAST  Result Date: 09/18/2020 CLINICAL DATA:  Brain mass or lesion. 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 Recent MRSA pneumonia. Patient reports visual symptoms and seizure-like activity a few weeks ago. EXAM: MRI HEAD WITHOUT AND WITH CONTRAST TECHNIQUE: Multiplanar, multiecho pulse sequences of the brain and surrounding structures were obtained without and with intravenous contrast. CONTRAST:  48mL GADAVIST GADOBUTROL 1 MMOL/ML IV SOLN COMPARISON:  No pertinent prior exams available for comparison. FINDINGS: Brain: Intermittently motion degraded examination, limiting evaluation. Most notably, there is moderate/severe motion degradation of the sagittal T1 weighted sequence, moderate/severe motion degradation of the axial T2/FLAIR sequence, moderate motion degradation of the axial T1 weighted postcontrast sequence and moderate motion degradation of the coronal T1 weighted postcontrast sequence. Mild generalized cerebral and cerebellar atrophy. Advanced patchy and confluent T2/FLAIR hyperintensity within the cerebral white matter, nonspecific but most often secondary to chronic small vessel ischemia. Chronic lacunar infarct within the left lentiform nucleus. Small foci of SWI signal loss within the left basal ganglia and along the margin of the posterior right lateral ventricle, likely reflecting chronic microhemorrhages. Additional subcentimeter focus of SWI signal loss within the midline cerebellum, which may reflect an additional chronic microhemorrhage or cavernoma. There is no acute infarct. No evidence of intracranial mass. No extra-axial fluid collection. No midline shift. Within the limitations of motion degradation, no abnormal  intracranial enhancement is identified. Vascular: Expected proximal arterial flow voids. Non dominant intracranial right vertebral artery. Skull and upper cervical spine: Within the limitations of motion degradation, no focal marrow lesion is identified. Sinuses/Orbits: Visualized orbits show no acute finding. 1.9 cm right maxillary sinus mucous retention cyst. Trace left maxillary sinus mucosal thickening. Other: Trace bilateral mastoid effusions. 2.8 cm ovoid lesion within the left suboccipital scalp demonstrating heterogeneous but predominantly T2 hyperintense signal, T1 hypointense signal, restricted diffusion and no convincing abnormal enhancement. This is favored to reflect a sebaceous/epidermoid cyst. IMPRESSION: Motion degraded examination, as described and limiting evaluation. No evidence of acute intracranial abnormality. Severe patchy and confluent T2/FLAIR hyperintensity within the cerebral white matter, nonspecific but most often secondary to chronic small vessel ischemia. Chronic left basal ganglia lacunar infarct. Subcentimeter focus of SWI signal loss within the midline cerebellum, which may reflect a chronic microhemorrhage or cavernoma. Mild generalized parenchymal atrophy. Incidentally noted 1.9 cm right maxillary sinus mucous retention cyst. Trace bilateral mastoid effusions. Electronically Signed   By: Kellie Simmering DO   On: 09/18/2020 15:59   CT ABDOMEN PELVIS W CONTRAST  Result Date: 09/17/2020 CLINICAL DATA:  Hemoptysis, shortness  of breath, abdominal distension EXAM: CT ANGIOGRAPHY CHEST CT ABDOMEN AND PELVIS WITH CONTRAST TECHNIQUE: Multidetector CT imaging of the chest was performed using the standard protocol during bolus administration of intravenous contrast. Multiplanar CT image reconstructions and MIPs were obtained to evaluate the vascular anatomy. Multidetector CT imaging of the abdomen and pelvis was performed using the standard protocol during bolus administration of  intravenous contrast. CONTRAST:  44mL OMNIPAQUE IOHEXOL 350 MG/ML SOLN COMPARISON:  CT 06/20/2020, 06/22/2020 FINDINGS: CTA CHEST FINDINGS Cardiovascular: Satisfactory opacification of the pulmonary arteries to the segmental level. No evidence of pulmonary embolism. Thoracic aorta is normal in course and caliber. Atherosclerotic calcifications of the aorta and coronary arteries. Normal heart size. No pericardial effusion. Mediastinum/Nodes: Mildly prominent mediastinal lymph nodes are again noted, likely reactive. No axillary or hilar lymphadenopathy. Lungs/Pleura: Redemonstrated cavitary mass located within the inferomedial aspect of the right upper lobe measuring approximately 9.9 x 6.6 x 7.4 cm (previously measured approximately 7.8 x 4.5 x 5.5 cm on 08/22/2020. Increased surrounding consolidation and ground-glass opacity within the adjacent right upper lobe and right middle lobe previously seen cavitary lesion within the medial aspect of the left upper lobe has resolved with small thin walled pleural cyst remaining (series 6, image 44). Scattered areas of ground-glass opacity within the bilateral lower lobes and upper lobes. Background of moderate emphysema. No pleural effusion or pneumothorax. Musculoskeletal: Remote right sixth and seventh rib fractures. No new or acute osseous findings. No chest wall abnormality. Review of the MIP images confirms the above findings. CT ABDOMEN and PELVIS FINDINGS Hepatobiliary: Cholelithiasis without evidence of pericholecystic inflammation by CT. Liver within normal limits. No biliary dilatation. Pancreas: Unremarkable. No pancreatic ductal dilatation or surrounding inflammatory changes. Spleen: Prior splenectomy. Adrenals/Urinary Tract: Unremarkable adrenal glands. Kidneys enhance symmetrically without focal lesion, stone, or hydronephrosis. Ureters are nondilated. Urinary bladder appears unremarkable. Stomach/Bowel: Stomach is within normal limits. Appendix appears normal  (series 2, image 58). Incidental note of a small bowel-small bowel intussusception within the mid left abdomen (series 2, image 38). No evidence of bowel wall thickening, distention, or inflammatory changes. Vascular/Lymphatic: Fusiform infrarenal abdominal aortic aneurysm measuring up to 3.1 cm, unchanged. Aortoiliac atherosclerosis. No abdominopelvic lymphadenopathy. Reproductive: Prostate is unremarkable. Other: No free fluid. No abdominopelvic fluid collection. No pneumoperitoneum. Fat containing midline supraumbilical hernia without evidence of complication. Musculoskeletal: No acute or significant osseous findings. Review of the MIP images confirms the above findings. IMPRESSION: 1. No evidence of acute pulmonary embolism. 2. Enlarging cavitary mass within the inferomedial aspect of the right upper lobe measuring up to 9.9 cm (previously measured approximately 7.8 x 4.5 x 5.5 cm on 08/22/2020). Increased surrounding consolidation and ground-glass opacity within the adjacent right upper lobe and right middle lobe. Findings remain most compatible with cavitary/necrotizing pneumonia. 3. Scattered areas of ground-glass opacity within the bilateral lower lobes and upper lobes, concerning for multifocal infection. 4. Interval resolution of previously seen cavitary lesion within the medial aspect of the left upper lobe with small thin walled pleural cyst remaining. 5. Cholelithiasis without evidence of pericholecystic inflammation by CT. 6. Incidental note of a small bowel-small bowel intussusception within the mid left abdomen. No evidence of bowel obstruction. 7. Fat containing midline supraumbilical hernia without evidence of complication. 8. Stable infrarenal abdominal aortic aneurysm measuring up to 3.1 cm. Recommend follow-up ultrasound every 3 years. This recommendation follows ACR consensus guidelines: White Paper of the ACR Incidental Findings Committee II on Vascular Findings. J Am Coll Radiol 2013;  10:789-794. Aortic Atherosclerosis (ICD10-I70.0) and  Emphysema (ICD10-J43.9). Electronically Signed   By: Davina Poke D.O.   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 There is increase in consolidation in this area compared to most recent study. Lungs elsewhere clear. Heart size and pulmonary vascular normal. No adenopathy appreciable. No bone lesions. IMPRESSION: Cavitary mass right middle lobe anteriorly with increase in surrounding consolidation compared to most recent study. The lungs elsewhere are clear. Heart size normal. No adenopathy appreciable by radiography. Electronically Signed   By: Lowella Grip III M.D.   On: 09/17/2020 13:54        Scheduled Meds:  heparin  5,000 Units Subcutaneous Q8H   nicotine  21 mg Transdermal Daily   predniSONE  10 mg Oral BID WC   Continuous Infusions:  vancomycin Stopped (09/18/20 0739)     LOS: 1 day    Time spent: 35 minutes    Edwin Dada, MD [facility] 09/18/2020, 5:23 PM     Please page though [place] or Epic secure chat:  For [employer], Adult nurse

## 2020-09-18 NOTE — ED Notes (Signed)
Pt unable to produce sputum culture at this time.

## 2020-09-19 ENCOUNTER — Inpatient Hospital Stay: Payer: Medicaid Other

## 2020-09-19 DIAGNOSIS — R4182 Altered mental status, unspecified: Secondary | ICD-10-CM

## 2020-09-19 DIAGNOSIS — E43 Unspecified severe protein-calorie malnutrition: Secondary | ICD-10-CM | POA: Insufficient documentation

## 2020-09-19 DIAGNOSIS — J850 Gangrene and necrosis of lung: Secondary | ICD-10-CM

## 2020-09-19 LAB — PROCALCITONIN: Procalcitonin: 0.51 ng/mL

## 2020-09-19 LAB — CBC WITH DIFFERENTIAL/PLATELET
Abs Immature Granulocytes: 0.04 10*3/uL (ref 0.00–0.07)
Basophils Absolute: 0 10*3/uL (ref 0.0–0.1)
Basophils Relative: 0 %
Eosinophils Absolute: 0 10*3/uL (ref 0.0–0.5)
Eosinophils Relative: 0 %
HCT: 29 % — ABNORMAL LOW (ref 39.0–52.0)
Hemoglobin: 9.8 g/dL — ABNORMAL LOW (ref 13.0–17.0)
Immature Granulocytes: 1 %
Lymphocytes Relative: 16 %
Lymphs Abs: 0.6 10*3/uL — ABNORMAL LOW (ref 0.7–4.0)
MCH: 27.8 pg (ref 26.0–34.0)
MCHC: 33.8 g/dL (ref 30.0–36.0)
MCV: 82.4 fL (ref 80.0–100.0)
Monocytes Absolute: 0.6 10*3/uL (ref 0.1–1.0)
Monocytes Relative: 15 %
Neutro Abs: 2.5 10*3/uL (ref 1.7–7.7)
Neutrophils Relative %: 68 %
Platelets: 256 10*3/uL (ref 150–400)
RBC: 3.52 MIL/uL — ABNORMAL LOW (ref 4.22–5.81)
RDW: 18.6 % — ABNORMAL HIGH (ref 11.5–15.5)
Smear Review: NORMAL
WBC: 3.7 10*3/uL — ABNORMAL LOW (ref 4.0–10.5)
nRBC: 0 % (ref 0.0–0.2)

## 2020-09-19 LAB — BASIC METABOLIC PANEL
Anion gap: 9 (ref 5–15)
BUN: 14 mg/dL (ref 6–20)
CO2: 24 mmol/L (ref 22–32)
Calcium: 7.8 mg/dL — ABNORMAL LOW (ref 8.9–10.3)
Chloride: 103 mmol/L (ref 98–111)
Creatinine, Ser: 0.48 mg/dL — ABNORMAL LOW (ref 0.61–1.24)
GFR, Estimated: >60 mL/min (ref >60)
Glucose, Bld: 163 mg/dL — ABNORMAL HIGH (ref 70–99)
Potassium: 3.4 mmol/L — ABNORMAL LOW (ref 3.5–5.1)
Sodium: 136 mmol/L (ref 135–145)

## 2020-09-19 LAB — MAGNESIUM: Magnesium: 1.8 mg/dL (ref 1.7–2.4)

## 2020-09-19 IMAGING — MR MR THORACIC SPINE WO/W CM
6 of 8 series · 32 of 48 positions shown · IV contrast (gadavist)
Comparison: Cervical study same day

CLINICAL DATA: Acute or progressive myelopathy.

EXAM:
MRI THORACIC WITHOUT AND WITH CONTRAST
TECHNIQUE: Multiplanar and multiecho pulse sequences of the thoracic spine were
obtained without and with intravenous contrast.
CONTRAST:  6mL GADAVIST GADOBUTROL 1 MMOL/ML IV SOLN

[Series 17: T1 · sagittal · 3.0mm · 1.33mm/px · 4 of 17 slices shown]
[im 1/17]
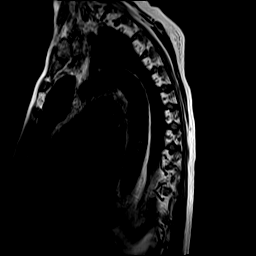
[im 6/17]
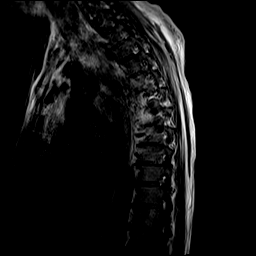
[im 11/17]
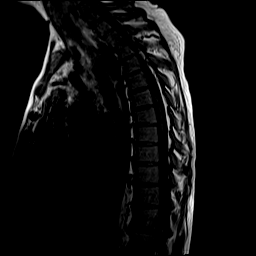
[im 17/17]
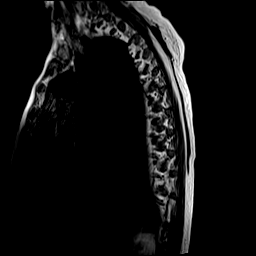

[Series 18: T2 · sagittal · 3.0mm · 1.33mm/px · 4 of 17 slices shown (1 of 2)]
[im 1/17]
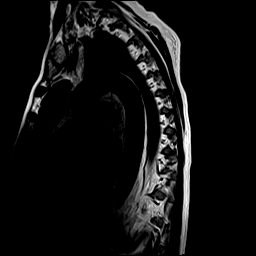
[im 6/17]
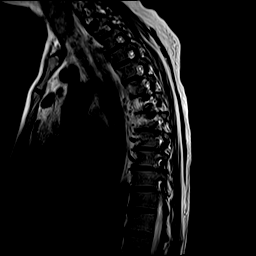
[im 11/17]
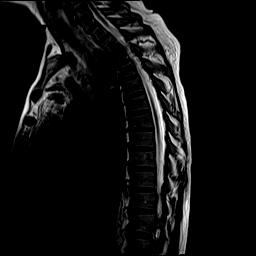
[im 17/17]
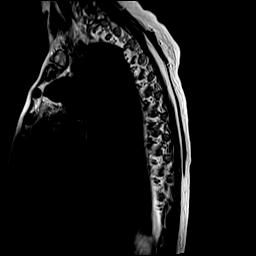

[Series 19: STIR · sagittal · 3.0mm · 0.66mm/px · 4 of 17 slices shown]
[im 1/17]
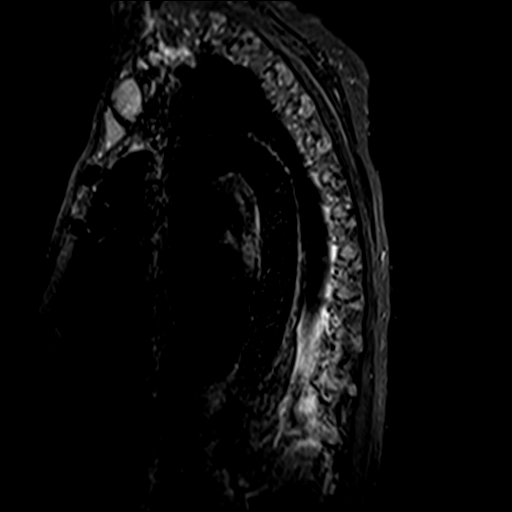
[im 6/17]
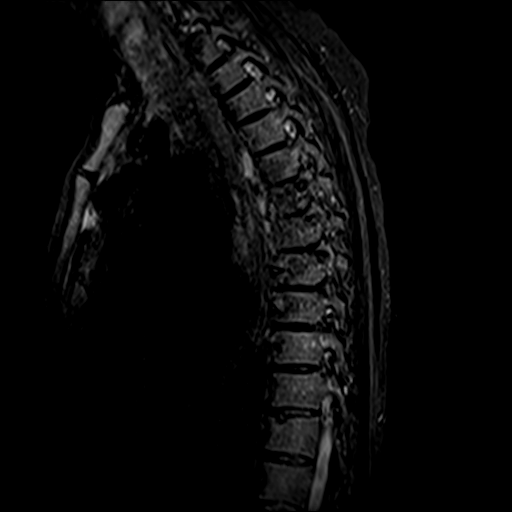
[im 11/17]
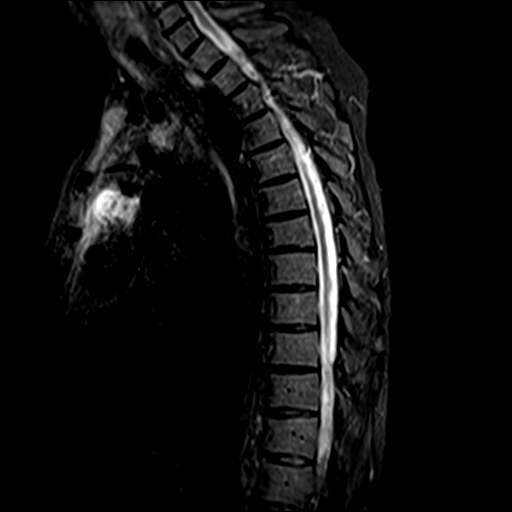
[im 17/17]
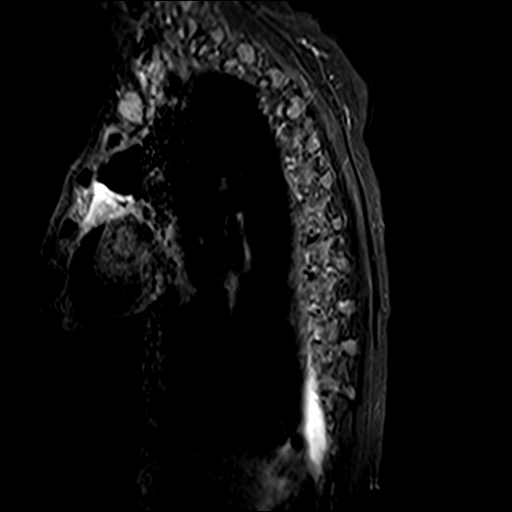

[Series 20: T2 · axial · 4.0mm · 0.59mm/px · z∈[-182,+38]mm · 8 of 39 slices shown (2 of 2)]
[im 1/39]
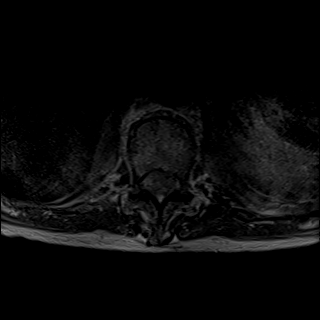
[im 6/39]
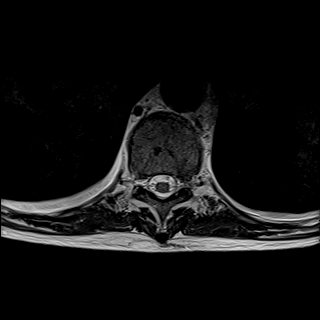
[im 11/39]
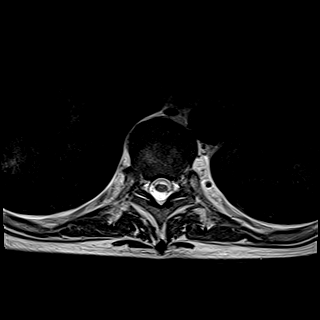
[im 17/39]
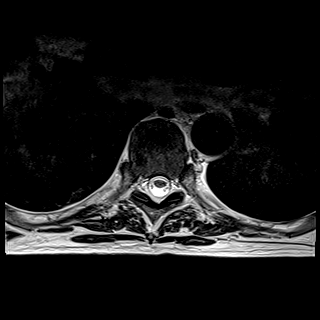
[im 22/39]
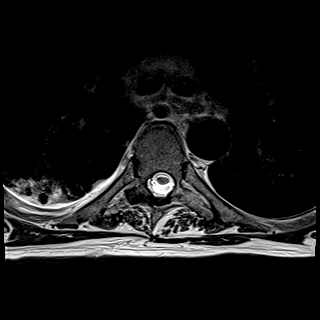
[im 28/39]
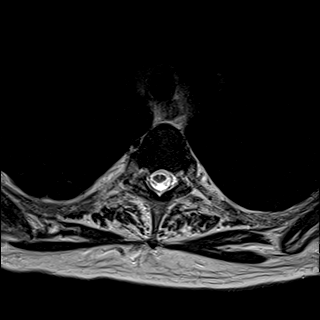
[im 33/39]
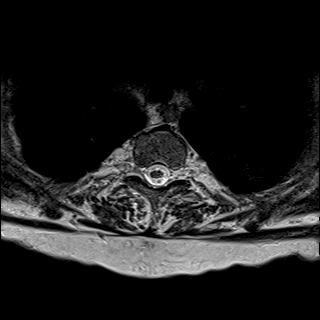
[im 39/39]
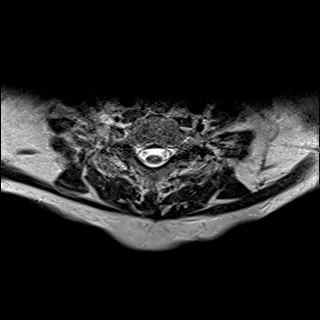

[Series 22: T1 post-contrast · axial · non-contrast · 4.0mm · 0.37mm/px · z∈[-182,+38]mm · 8 of 39 slices shown]
[im 1/39]
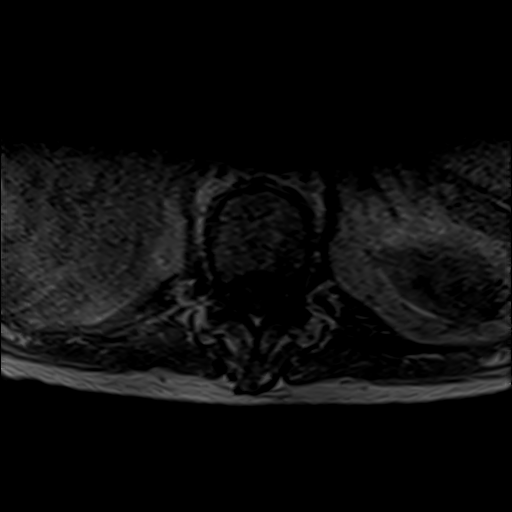
[im 6/39]
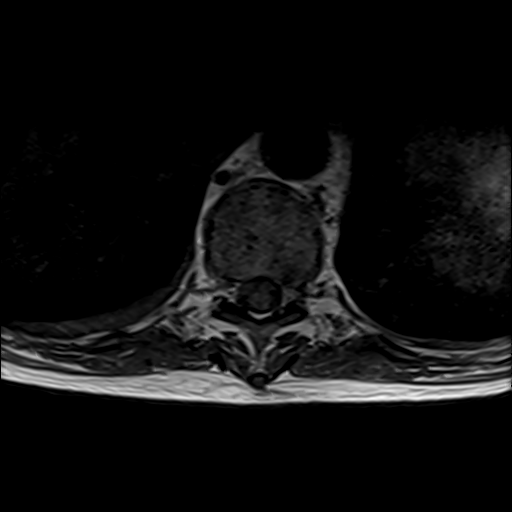
[im 11/39]
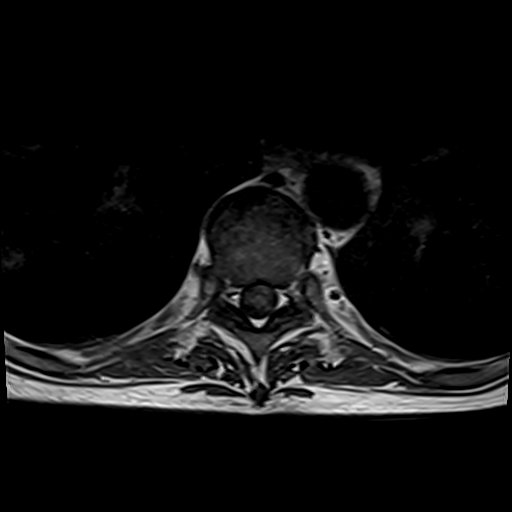
[im 17/39]
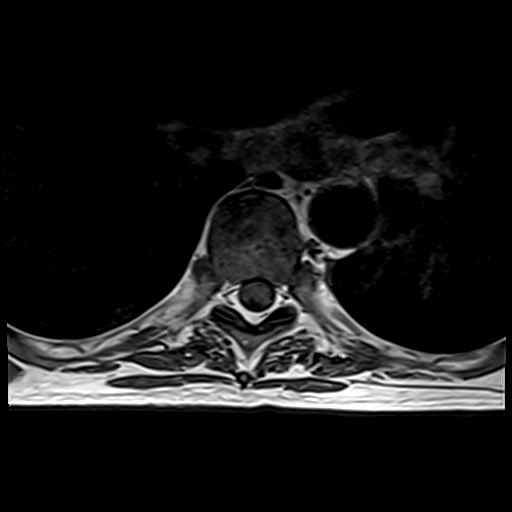
[im 22/39]
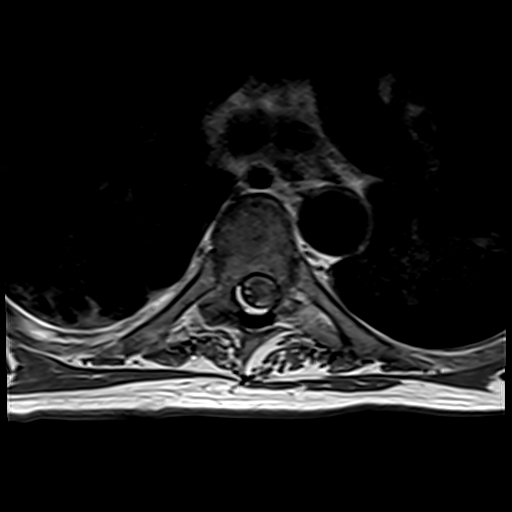
[im 28/39]
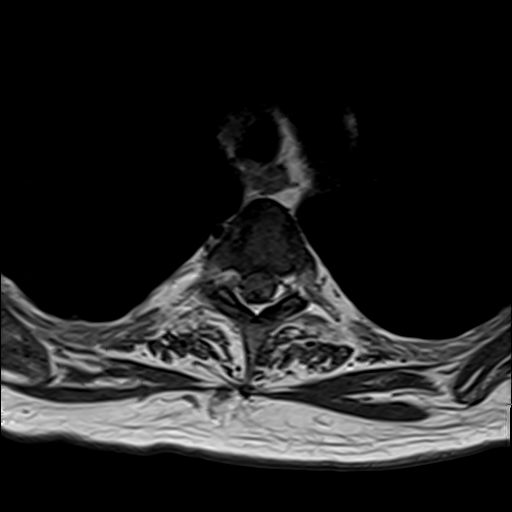
[im 33/39]
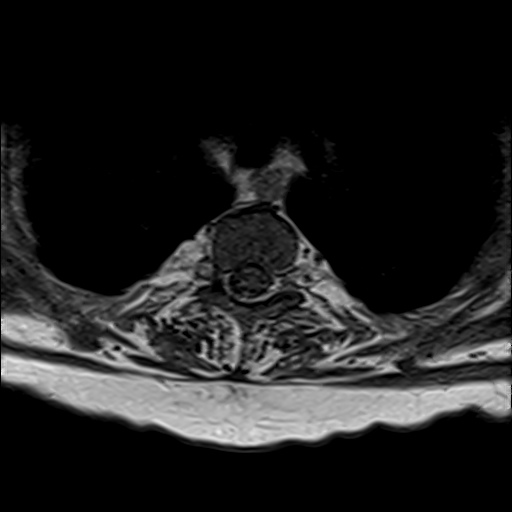
[im 39/39]
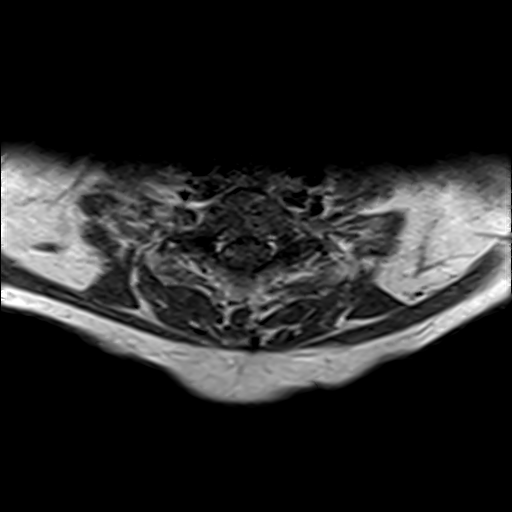

[Series 24: T1 fat-sat post-contrast · sagittal · 3.0mm · 1.33mm/px · 4 of 17 slices shown]
[im 1/17]
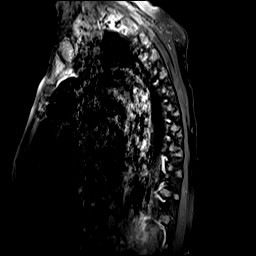
[im 6/17]
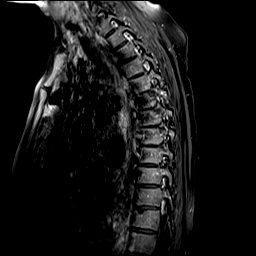
[im 11/17]
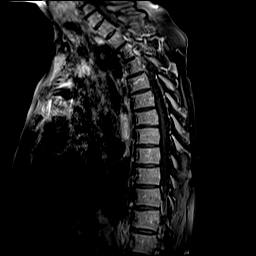
[im 17/17]
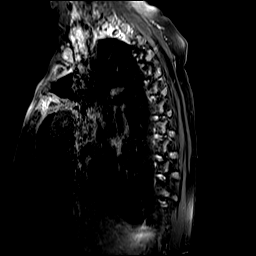

[32 of 48 positions shown; findings below may reference images not displayed]

FINDINGS: Alignment:  Normal

Vertebrae: No fracture or focal bone lesion.

Cord: No cord compression or primary cord lesion. Spinal canal
widely patent throughout the region. No evidence of arteriovenous
malformation.

Paraspinal and other soft tissues: Negative

Disc levels:

No significant disc pathology in the thoracic region. Wide patency
of the canal and foramina. Ordinary mild facet osteoarthritis
without edema or enhancement. No encroachment upon the neural
spaces.
IMPRESSION: Negative thoracic study. No cord compression or cord lesion. No
significant degenerative changes.

## 2020-09-19 IMAGING — MR MR THORACIC SPINE WO/W CM
1 series · 9 of 9 positions shown · IV contrast (6ml Gadavist)
Comparison: Cervical study same day

CLINICAL DATA: Acute or progressive myelopathy.

EXAM:
MRI THORACIC WITHOUT AND WITH CONTRAST
TECHNIQUE: Multiplanar and multiecho pulse sequences of the thoracic spine were
obtained without and with intravenous contrast.
CONTRAST:  6mL GADAVIST GADOBUTROL 1 MMOL/ML IV SOLN

[Series 13: T1 · sagittal · 5.0mm · 1.41mm/px · 9 of 9 slices shown]
[im 1/9]
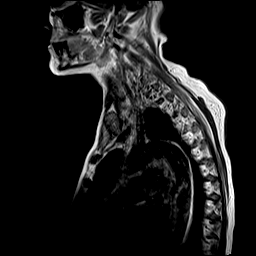
[im 2/9]
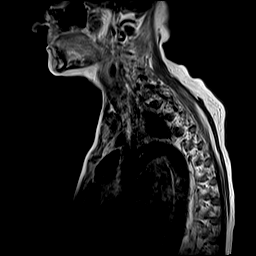
[im 3/9]
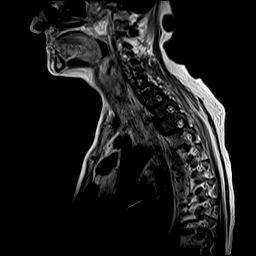
[im 4/9]
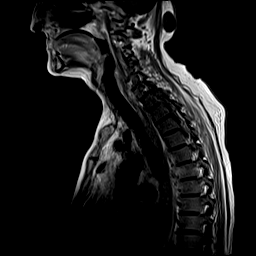
[im 5/9]
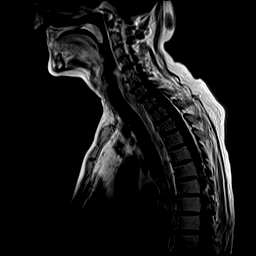
[im 6/9]
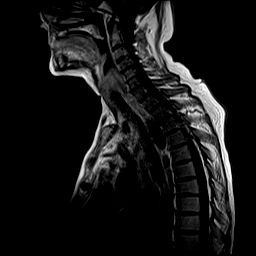
[im 7/9]
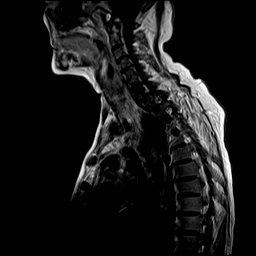
[im 8/9]
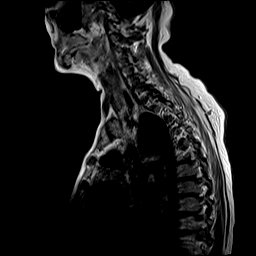
[im 9/9]
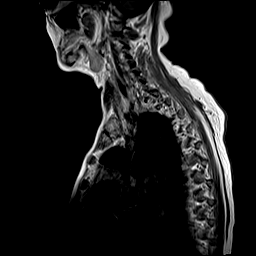

[9 of 9 positions shown; findings below may reference images not displayed]

FINDINGS: Alignment:  Normal

Vertebrae: No fracture or focal bone lesion.

Cord: No cord compression or primary cord lesion. Spinal canal
widely patent throughout the region. No evidence of arteriovenous
malformation.

Paraspinal and other soft tissues: Negative

Disc levels:

No significant disc pathology in the thoracic region. Wide patency
of the canal and foramina. Ordinary mild facet osteoarthritis
without edema or enhancement. No encroachment upon the neural
spaces.
IMPRESSION: Negative thoracic study. No cord compression or cord lesion. No
significant degenerative changes.

## 2020-09-19 IMAGING — MR MR CERVICAL SPINE WO/W CM
5 of 8 series · 28 of 48 positions shown · IV contrast (gadavist)
Comparison: No previous cervical imaging.

CLINICAL DATA: Acute or progressive myelopathy.

EXAM:
MRI CERVICAL SPINE WITHOUT AND WITH CONTRAST
TECHNIQUE: Multiplanar and multiecho pulse sequences of the cervical spine, to
include the craniocervical junction and cervicothoracic junction,
were obtained without and with intravenous contrast.
CONTRAST:  6mL GADAVIST GADOBUTROL 1 MMOL/ML IV SOLN

[Series 5: T2 · sagittal · 3.0mm · 0.62mm/px · 4 of 15 slices shown (1 of 2)]
[im 1/15]
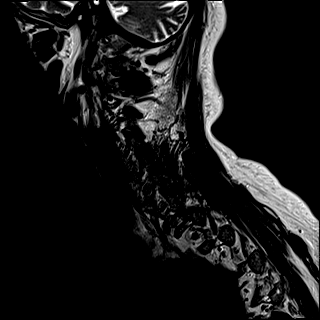
[im 5/15]
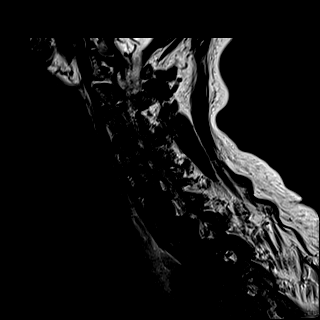
[im 10/15]
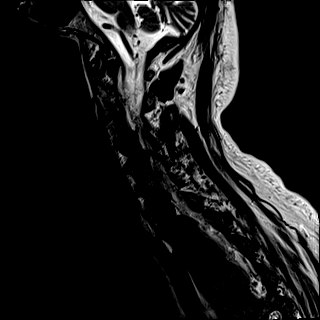
[im 15/15]
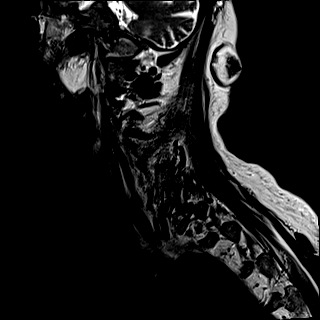

[Series 7: STIR · sagittal · 3.0mm · 0.62mm/px · 4 of 15 slices shown]
[im 1/15]
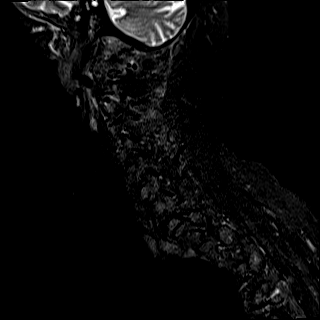
[im 5/15]
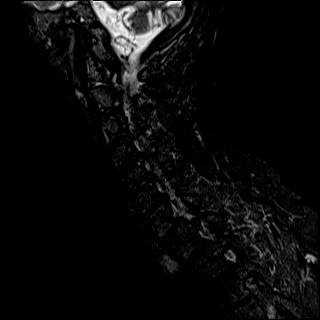
[im 10/15]
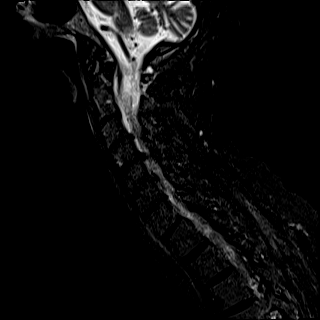
[im 15/15]
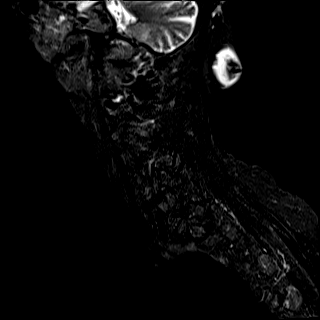

[Series 8: T2 · axial · 3.0mm · 0.70mm/px · z∈[+43,+127]mm · 8 of 28 slices shown (2 of 2)]
[im 1/28]
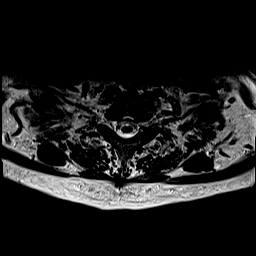
[im 4/28]
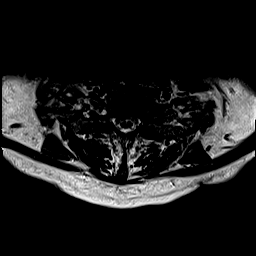
[im 8/28]
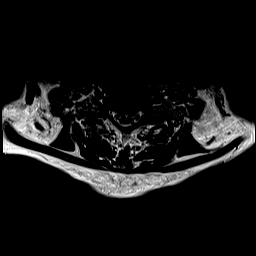
[im 12/28]
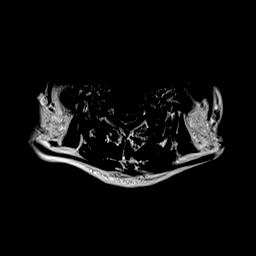
[im 16/28]
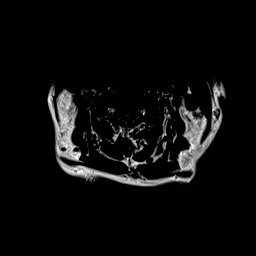
[im 20/28]
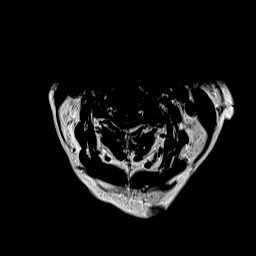
[im 24/28]
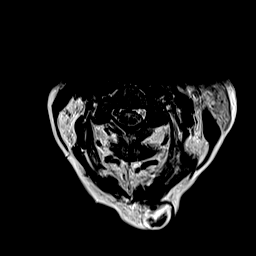
[im 28/28]
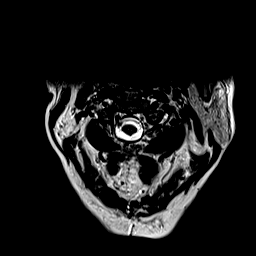

[Series 10: T1 · axial · non-contrast · 3.0mm · 0.35mm/px · z∈[+43,+127]mm · 8 of 28 slices shown]
[im 1/28]
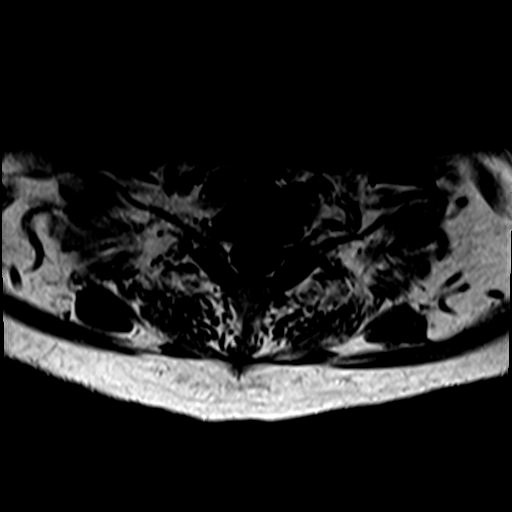
[im 4/28]
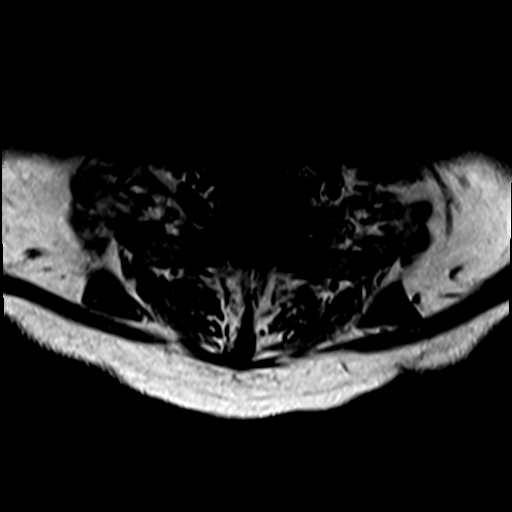
[im 8/28]
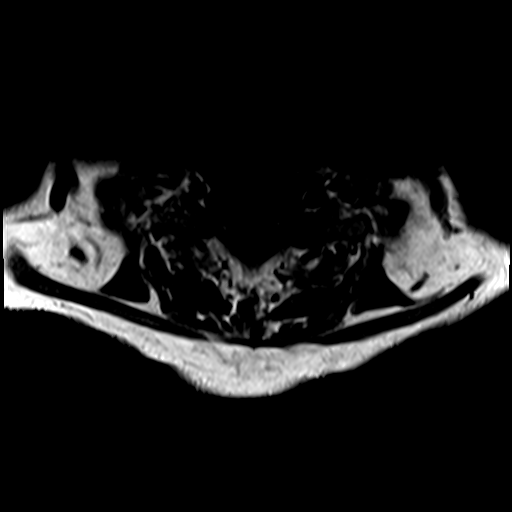
[im 12/28]
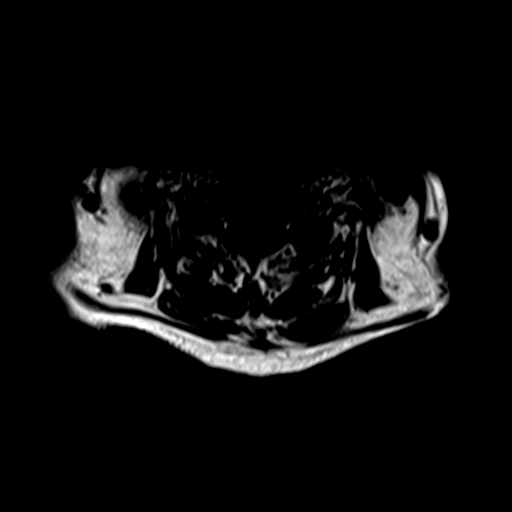
[im 16/28]
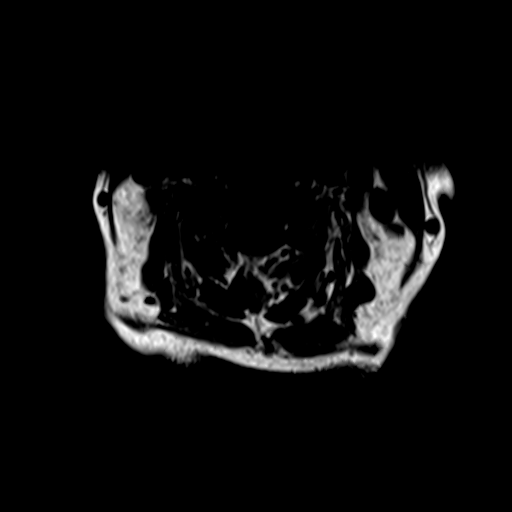
[im 20/28]
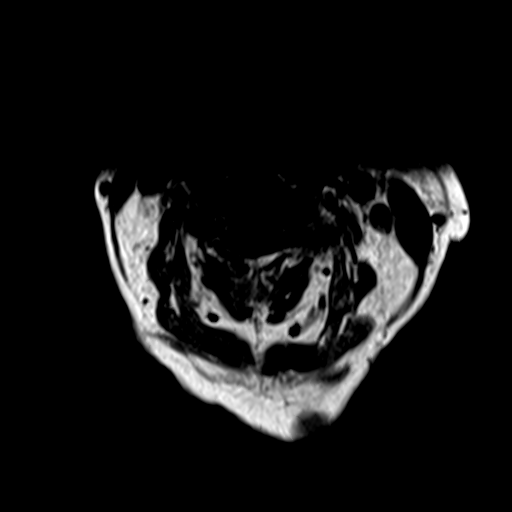
[im 24/28]
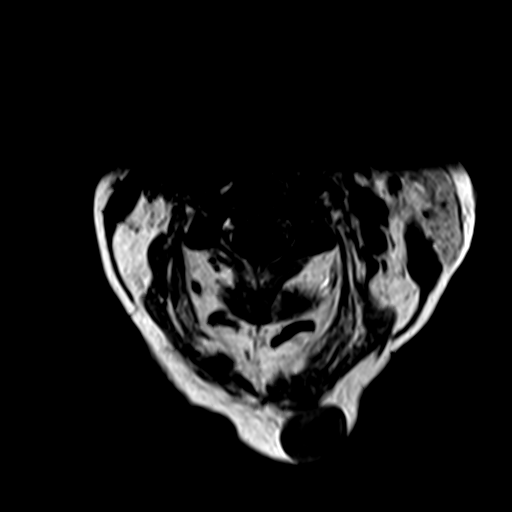
[im 28/28]
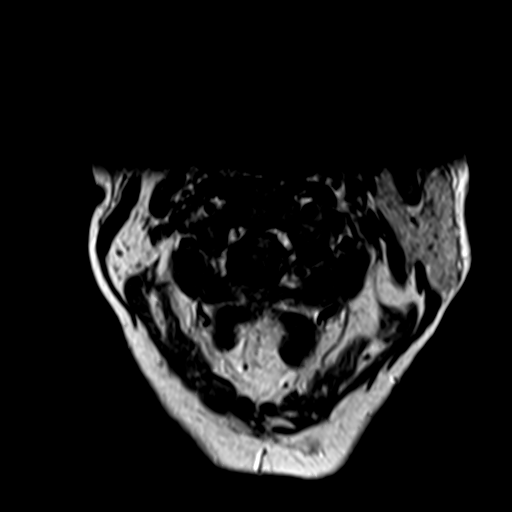

[Series 12: T1 post-contrast · axial · 3.0mm · 0.35mm/px · z∈[+43,+77]mm · 4 of 28 slices shown]
[im 1/28]
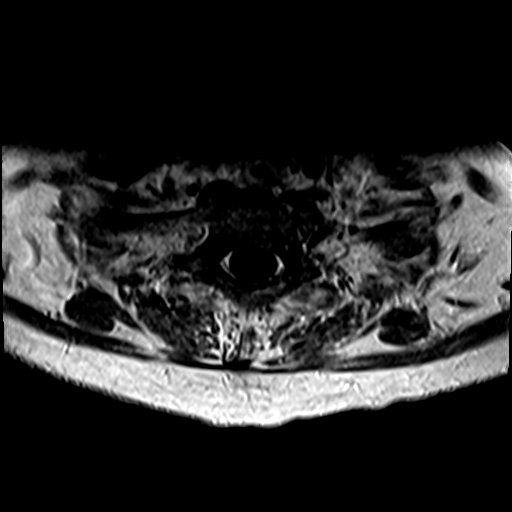
[im 4/28]
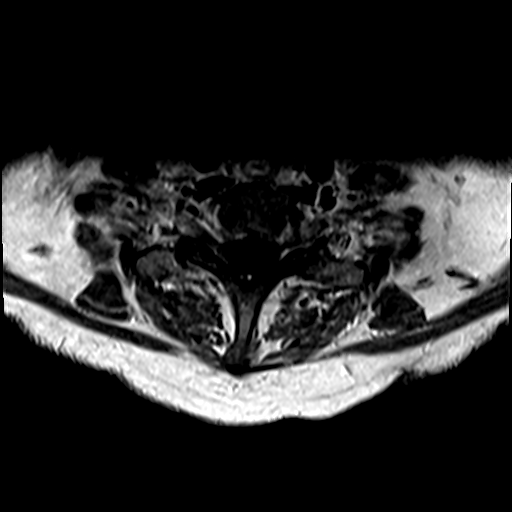
[im 8/28]
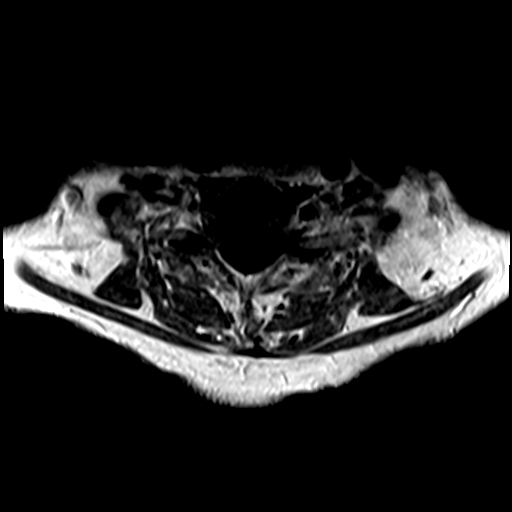
[im 12/28]
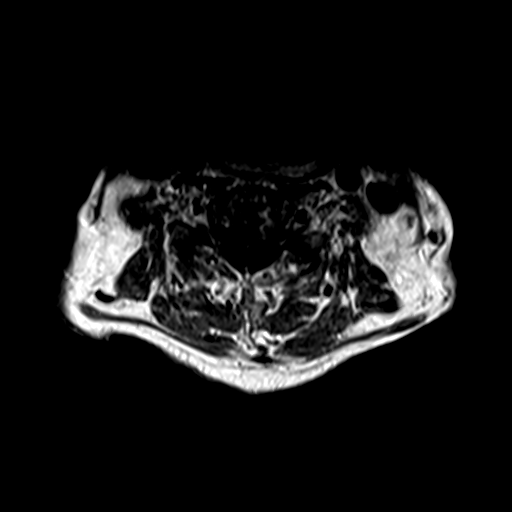

[28 of 48 positions shown; findings below may reference images not displayed]

FINDINGS: Alignment: Straightening of the normal cervical lordosis. 1 mm
degenerative anterolisthesis C7-T1.

Vertebrae: No fracture or focal bone lesion.

Cord: No primary cord lesion.  See below regarding stenosis.

Posterior Fossa, vertebral arteries, paraspinal tissues: Large
sebaceous cyst of the upper left neck.

Disc levels:

Foramen magnum is widely patent.  C1-2 and C2-3 are normal.

C3-4: Endplate osteophytes and bulging of the disc. Canal stenosis
with AP diameter in the midline 6 mm. Effacement of the subarachnoid
space and slight indentation of the cord. Moderate bilateral
foraminal narrowing.

C4-5: Endplate osteophytes and protrusion of the disc more prominent
towards the left. Effacement of the subarachnoid space and cord
deformity, worse on the left. AP diameter of the canal 5.9 mm.
Bilateral foraminal stenosis.

C5-6: Spondylosis with endplate osteophytes and bulging of the disc.
Canal narrowing with AP diameter in the midline 8.3 mm. No
compressive effect upon the cord. Bilateral foraminal narrowing.

C6-7: Mild bulging of the disc. No canal stenosis. Foraminal
narrowing on the right that could affect the exiting C7 nerve.

C7-T1: Facet osteoarthritis with 1 mm of anterolisthesis. No
stenosis of the canal or foramina.
IMPRESSION: Degenerative spondylosis with spinal stenosis at C3-4 and C4-5. AP
diameter of the canal at C3-4 6 mm. AP diameter of the spinal canal
at C4-5 5.9 mm. Effacement of the subarachnoid space with some
indentation of the cord. No abnormal cord T2 signal however.

Foraminal stenosis that could cause neural compression on either
side at C3-4, either side at C4-5, either side at C5-6 and on the
right C6-7.

## 2020-09-19 MED ORDER — MUPIROCIN 2 % EX OINT
TOPICAL_OINTMENT | Freq: Two times a day (BID) | CUTANEOUS | Status: DC
  Filled 2020-09-19 (×2): qty 22

## 2020-09-19 MED ORDER — LINEZOLID 600 MG/300ML IV SOLN
600.0000 mg | Freq: Two times a day (BID) | INTRAVENOUS | Status: AC
  Administered 2020-09-20 (×2): 600 mg via INTRAVENOUS
  Filled 2020-09-19 (×3): qty 300

## 2020-09-19 MED ORDER — ADULT MULTIVITAMIN W/MINERALS CH
1.0000 | ORAL_TABLET | Freq: Every day | ORAL | Status: DC
  Administered 2020-09-19 – 2020-09-28 (×10): 1 via ORAL
  Filled 2020-09-19 (×9): qty 1

## 2020-09-19 MED ORDER — CHLORHEXIDINE GLUCONATE CLOTH 2 % EX PADS
6.0000 | MEDICATED_PAD | Freq: Every day | CUTANEOUS | Status: DC
  Administered 2020-09-20 – 2020-09-26 (×7): 6 via TOPICAL

## 2020-09-19 MED ORDER — GUAIFENESIN ER 600 MG PO TB12
600.0000 mg | ORAL_TABLET | Freq: Two times a day (BID) | ORAL | Status: DC
  Administered 2020-09-19 – 2020-09-28 (×18): 600 mg via ORAL
  Filled 2020-09-19 (×19): qty 1

## 2020-09-19 MED ORDER — GADOBUTROL 1 MMOL/ML IV SOLN
6.0000 mL | Freq: Once | INTRAVENOUS | Status: AC | PRN
  Administered 2020-09-19: 6 mL via INTRAVENOUS

## 2020-09-19 MED ORDER — ENSURE ENLIVE PO LIQD
237.0000 mL | Freq: Three times a day (TID) | ORAL | Status: DC
  Administered 2020-09-19 – 2020-09-28 (×27): 237 mL via ORAL

## 2020-09-19 MED ORDER — POTASSIUM CHLORIDE CRYS ER 20 MEQ PO TBCR
40.0000 meq | EXTENDED_RELEASE_TABLET | Freq: Once | ORAL | Status: AC
  Administered 2020-09-19: 40 meq via ORAL
  Filled 2020-09-19: qty 2

## 2020-09-19 NOTE — Progress Notes (Addendum)
[place] [facility] PROGRESS NOTE    Marcus [name]  PYK:[id number] DOB: 09-23-1960 DOA: 09/17/2020 PCP: Marcus Clarks, FNP    Brief Narrative:  Marcus [name] is a 60 y.o. M with RA c/b Felty syndrome/neutropenia, smoking, and COPD as well as recently diagnosed MRSA abscess who presents with failure of outpatient treamtent.  Patient admitted last month for 8 days for cough and shortness of breath as well as hemoptysis found to have a large cavitary lesion in the right upper lobe concerning for lung abscess.  Sputum was positive for MRSA and so he was discharged on 6 weeks of oral doxycycline.  He reports adherence to his medication after some initial improvement with IV vancomycin in the hospital, he then started to get persistently worse, finally was coughing up blood and had chest pain and malaise/fatigue, so he came to the ER.  He was started on linezolid by ID, continue to have some hemoptysis, pulmonology is recommending bronchoscopy and a possible surgery.  Assessment & Plan:  Acute on chronic respiratory failure with hypoxia At baseline he is on 2 L.  Currently on 3 to 4 L of oxygen. -Continue with incentive spirometry and flutter valve -Continue with supplemental oxygen  Necrotizing pneumonia causing sepsis Presented with tachycardia, tachypnea, leukopenia, and blood pressure less than 100. Initially received vancomycin, cefepime and Flagyl later transitioned to linezolid by ID Pulmonology is recommending bronchoscopy, multiple labs including Fungitell is pending, concern of aspergilloma, might need bronchoscopy or a surgery. -Continue with linezolid  Neutropenia.  Some improvement today Noted during last hospitalization. -Hematology was consulted-appreciate their help.  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  Magnesium at 1.8 -Replete potassium and monitor  Shakiness.  There was some witnessed  shakiness, neurology was consulted for concern of seizure-like activity.  No shakiness or any other seizure noted.  EEG normal. -Continue to monitor  Chronic diastolic CHF Euvolemic  Intussusception This is an apparently incidental finding, no evidence of small bowel obstruction, belly exam benign - Advance diet as tolerated -Stop PPI   Anemia of chronic disease Hgb stable at 9.8 in setting of mild hemoptysis -Trend Hgb -Monitor for ongoing bleeding  Hyponatremia Mild, asymptoamtic   Smoking Cessation recommended, modalities discussed  Severe protein calorie malnutrition As evidenced by severe loss of subcutaneous muscle mass and fat, chronic infection, smoking -Consutl dietitian  Disposition: Status is: Inpatient  Remains inpatient appropriate because: protect  failed outpatient therapy, still on higher than baseline O2, requires ongoing diagnostic testing perhaps as dictated by Pulmonogloy  Dispo: The patient is from: Home              Anticipated d/c is to: Home              Patient currently is not medically stable to d/c.   Difficult to place patient No              Level of care: Med-Surg  MDM: The below labs and imaging reports were reviewed and summarized above.  Medication management as above.  This is a severe threat to life and bodily function    DVT prophylaxis: heparin injection 5,000 Units Start: 09/17/20 2200  Code Status: Full code Family Communication: None present   Subjective: Continues to have cough with hemoptysis.  Denies any chest pain.  Objective: Vitals:   09/19/20 0500 09/19/20 0938 09/19/20 1000 09/19/20 1200  BP: (!) 102/58 104/63  (!) 144/59  Pulse: 70 72  81  Resp: 18 18  18   Temp: (!) 97.2 F (36.2 C)   97.7 F (36.5 C)  TempSrc: Oral   Oral  SpO2: 93% 93%  92%  Weight:   63.7 kg     Intake/Output Summary (Last 24 hours) at 09/19/2020 1641 Last data filed at 09/19/2020 0930 Gross per 24 hour  Intake --  Output 1550 ml   Net -1550 ml    Filed Weights   09/19/20 1000  Weight: 63.7 kg    Examination: General.  Frail gentleman, in no acute distress. Pulmonary.  Decreased breath sounds at right upper to mid zone, normal respiratory effort. CV.  Regular rate and rhythm, no JVD, rub or murmur. Abdomen.  Soft, nontender, nondistended, BS positive. CNS.  Alert and oriented x3.  No focal neurologic deficit. Extremities.  No edema, no cyanosis, pulses intact and symmetrical. Psychiatry.  Judgment and insight appears normal.    Data Reviewed: I have personally reviewed following labs and imaging studies:  CBC: Recent Labs  Lab 09/17/20 1345 09/18/20 0533 09/19/20 0506  WBC 3.1* 4.9 3.7*  NEUTROABS 1.5*  --  2.5  HGB 12.9* 9.8* 9.8*  HCT 40.2 29.7* 29.0*  MCV 84.1 83.4 82.4  PLT 190 203 629    Basic Metabolic Panel: Recent Labs  Lab 09/17/20 1345 09/18/20 0533 09/19/20 0506  NA 131* 131* 136  K 3.3* 3.6 3.4*  CL 96* 104 103  CO2 28 26 24   GLUCOSE 137* 72 163*  BUN 33* 20 14  CREATININE 0.78 0.48* 0.48*  CALCIUM 7.9* 7.2* 7.8*  MG  --   --  1.8    GFR: Estimated Creatinine Clearance: 89.6 mL/min (A) (by C-G formula based on SCr of 0.48 mg/dL (L)). Liver Function Tests: Recent Labs  Lab 09/17/20 1345 09/18/20 0533  AST 21 15  ALT 12 9  ALKPHOS 276* 168*  BILITOT 1.0 0.8  PROT 6.7 5.0*  ALBUMIN 2.4* 1.8*    No results for input(s): LIPASE, AMYLASE in the last 168 hours. No results for input(s): AMMONIA in the last 168 hours. Coagulation Profile: Recent Labs  Lab 09/17/20 1345  INR 1.1    Cardiac Enzymes: No results for input(s): CKTOTAL, CKMB, CKMBINDEX, TROPONINI in the last 168 hours. BNP (last 3 results) No results for input(s): PROBNP in the last 8760 hours. HbA1C: No results for input(s): HGBA1C in the last 72 hours. CBG: No results for input(s): GLUCAP in the last 168 hours. Lipid Profile: No results for input(s): CHOL, HDL, LDLCALC, TRIG, CHOLHDL,  LDLDIRECT in the last 72 hours. Thyroid Function Tests: No results for input(s): TSH, T4TOTAL, FREET4, T3FREE, THYROIDAB in the last 72 hours. Anemia Panel: No results for input(s): VITAMINB12, FOLATE, FERRITIN, TIBC, IRON, RETICCTPCT in the last 72 hours. Urine analysis:    Component Value Date/Time   COLORURINE AMBER (A) 08/22/2020 1253   APPEARANCEUR HAZY (A) 08/22/2020 1253   LABSPEC 1.024 08/22/2020 1253   PHURINE 5.0 08/22/2020 1253   GLUCOSEU NEGATIVE 08/22/2020 1253   HGBUR NEGATIVE 08/22/2020 [place] 08/22/2020 1253   KETONESUR NEGATIVE 08/22/2020 1253   PROTEINUR NEGATIVE 08/22/2020 1253   UROBILINOGEN 0.2 10/21/2012 1737   NITRITE NEGATIVE 08/22/2020 1253   LEUKOCYTESUR NEGATIVE 08/22/2020 1253   Sepsis Labs: @LABRCNTIP (procalcitonin:4,lacticacidven:4)  ) Recent Results (from the past 240 hour(s))  Blood culture (routine x 2)     Status: None (Preliminary result)   Collection Time: 09/17/20  1:58 PM   Specimen: BLOOD  Result  Value Ref Range Status   Specimen Description BLOOD BLOOD LEFT FOREARM  Final   Special Requests   Final    BOTTLES DRAWN AEROBIC AND ANAEROBIC Blood Culture adequate volume   Culture   Final    NO GROWTH 2 DAYS Performed at [facility], [address] [place], [place] [address]    Report Status PENDING  Incomplete  Blood culture (routine x 2)     Status: None (Preliminary result)   Collection Time: 09/17/20  1:59 PM   Specimen: BLOOD  Result Value Ref Range Status   Specimen Description BLOOD BLOOD RIGHT FOREARM  Final   Special Requests   Final    BOTTLES DRAWN AEROBIC AND ANAEROBIC Blood Culture results may not be optimal due to an excessive volume of blood received in culture bottles   Culture   Final    NO GROWTH 2 DAYS Performed at [facility], [address], [place], [place] [address]    Report Status PENDING  Incomplete  Resp Panel by RT-PCR (Flu A&B, Covid) Nasopharyngeal Swab      Status: None   Collection Time: 09/17/20  2:14 PM   Specimen: Nasopharyngeal Swab; Nasopharyngeal(NP) swabs in vial transport medium  Result Value Ref Range Status   SARS Coronavirus 2 by RT PCR NEGATIVE NEGATIVE Final    Comment: (NOTE) SARS-CoV-2 target nucleic acids are NOT DETECTED.  The SARS-CoV-2 RNA is generally detectable in upper respiratory specimens during the acute phase of infection. The lowest concentration of SARS-CoV-2 viral copies this assay can detect is 138 copies/mL. A negative result does not preclude SARS-Cov-2 infection and should not be used as the sole basis for treatment or other patient management decisions. A negative result may occur with  improper specimen collection/handling, submission of specimen other than nasopharyngeal swab, presence of viral mutation(s) within the areas targeted by this assay, and inadequate number of viral copies(<138 copies/mL). A negative result must be combined with clinical observations, patient history, and epidemiological information. The expected result is Negative.  Fact Sheet for Patients:  EntrepreneurPulse.com.au  Fact Sheet for Healthcare Providers:  IncredibleEmployment.be  This test is no t yet approved or cleared by the [place] FDA and  has been authorized for detection and/or diagnosis of SARS-CoV-2 by FDA under an Emergency Use Authorization (EUA). This EUA will remain  in effect (meaning this test can be used) for the duration of the COVID-19 declaration under Section 564(b)(1) of the Act, 21 U.S.C.section 360bbb-3(b)(1), unless the authorization is terminated  or revoked sooner.       Influenza A by PCR NEGATIVE NEGATIVE Final   Influenza B by PCR NEGATIVE NEGATIVE Final    Comment: (NOTE) The Xpert Xpress SARS-CoV-2/FLU/RSV plus assay is intended as an aid in the diagnosis of influenza from Nasopharyngeal swab specimens and should not be used as a sole basis  for treatment. Nasal washings and aspirates are unacceptable for Xpert Xpress SARS-CoV-2/FLU/RSV testing.  Fact Sheet for Patients: EntrepreneurPulse.com.au  Fact Sheet for Healthcare Providers: IncredibleEmployment.be  This test is not yet approved or cleared by the [place] FDA and has been authorized for detection and/or diagnosis of SARS-CoV-2 by FDA under an Emergency Use Authorization (EUA). This EUA will remain in effect (meaning this test can be used) for the duration of the COVID-19 declaration under Section 564(b)(1) of the Act, 21 U.S.C. section 360bbb-3(b)(1), unless the authorization is terminated or revoked.  Performed at [facility], [address] [place], [place] [address]  MRSA PCR Screening     Status: Abnormal   Collection Time: 09/18/20  8:36 AM   Specimen: Nasopharyngeal  Result Value Ref Range Status   MRSA by PCR POSITIVE (A) NEGATIVE Final    Comment:        The GeneXpert MRSA Assay (FDA approved for NASAL specimens only), is one component of a comprehensive MRSA colonization surveillance program. It is not intended to diagnose MRSA infection nor to guide or monitor treatment for MRSA infections. RESULT CALLED TO, READ BACK BY AND VERIFIED WITH: Leota Jacobsen 1029 09/18/20 GM Performed at [facility], [address] [place], [place] [address]          Radiology Studies: MR BRAIN W WO CONTRAST  Result Date: 09/18/2020 CLINICAL DATA:  Brain mass or lesion. 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 Recent MRSA pneumonia. Patient reports visual symptoms and seizure-like activity a few weeks ago. EXAM: MRI HEAD WITHOUT AND WITH CONTRAST TECHNIQUE: Multiplanar, multiecho pulse sequences of the brain and surrounding structures were obtained without and with intravenous contrast. CONTRAST:  79mL GADAVIST GADOBUTROL 1  MMOL/ML IV SOLN COMPARISON:  No pertinent prior exams available for comparison. FINDINGS: Brain: Intermittently motion degraded examination, limiting evaluation. Most notably, there is moderate/severe motion degradation of the sagittal T1 weighted sequence, moderate/severe motion degradation of the axial T2/FLAIR sequence, moderate motion degradation of the axial T1 weighted postcontrast sequence and moderate motion degradation of the coronal T1 weighted postcontrast sequence. Mild generalized cerebral and cerebellar atrophy. Advanced patchy and confluent T2/FLAIR hyperintensity within the cerebral white matter, nonspecific but most often secondary to chronic small vessel ischemia. Chronic lacunar infarct within the left lentiform nucleus. Small foci of SWI signal loss within the left basal ganglia and along the margin of the posterior right lateral ventricle, likely reflecting chronic microhemorrhages. Additional subcentimeter focus of SWI signal loss within the midline cerebellum, which may reflect an additional chronic microhemorrhage or cavernoma. There is no acute infarct. No evidence of intracranial mass. No extra-axial fluid collection. No midline shift. Within the limitations of motion degradation, no abnormal intracranial enhancement is identified. Vascular: Expected proximal arterial flow voids. Non dominant intracranial right vertebral artery. Skull and upper cervical spine: Within the limitations of motion degradation, no focal marrow lesion is identified. Sinuses/Orbits: Visualized orbits show no acute finding. 1.9 cm right maxillary sinus mucous retention cyst. Trace left maxillary sinus mucosal thickening. Other: Trace bilateral mastoid effusions. 2.8 cm ovoid lesion within the left suboccipital scalp demonstrating heterogeneous but predominantly T2 hyperintense signal, T1 hypointense signal, restricted diffusion and no convincing abnormal enhancement. This is favored to reflect a sebaceous/epidermoid  cyst. IMPRESSION: Motion degraded examination, as described and limiting evaluation. No evidence of acute intracranial abnormality. Severe patchy and confluent T2/FLAIR hyperintensity within the cerebral white matter, nonspecific but most often secondary to chronic small vessel ischemia. Chronic left basal ganglia lacunar infarct. Subcentimeter focus of SWI signal loss within the midline cerebellum, which may reflect a chronic microhemorrhage or cavernoma. Mild generalized parenchymal atrophy. Incidentally noted 1.9 cm right maxillary sinus mucous retention cyst. Trace bilateral mastoid effusions. Electronically Signed   By: Kellie Simmering DO   On: 09/18/2020 15:59      Scheduled Meds:  [START ON 09/20/2020] Chlorhexidine Gluconate Cloth  6 each Topical Q0600   feeding supplement  237 mL Oral TID BM   heparin  5,000 Units Subcutaneous Q8H   multivitamin with minerals  1 tablet Oral Daily   mupirocin  ointment   Nasal BID   nicotine  21 mg Transdermal Daily   predniSONE  10 mg Oral BID WC   Continuous Infusions:  [START ON 09/20/2020] linezolid (ZYVOX) IV       LOS: 2 days    Time spent: 35 minutes  This record has been created using Systems analyst. Errors have been sought and corrected,but may not always be located. Such creation errors do not reflect on the standard of care.   Lorella Nimrod, MD [facility] 09/19/2020, 4:41 PM     Please page though [place] or Epic secure chat:  For [employer], Adult nurse

## 2020-09-19 NOTE — Consult Note (Signed)
Neurology Consultation Reason for Consult: Seizure-like activity Requesting Physician: Zetta Bills  CC: Hemoptysis  History is obtained from: Patient, family and chart review  HPI: Marcus [name] is a 60 y.o. 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  He had uncontrollable jerking of all 4 limbs that was somewhat arrhythmic and asynchronous and lasted about 15 minutes.  There is no clear trigger and no clear alleviating event.  He did not lose consciousness or have bowel or bladder incontinence or tongue biting.  He does however note that for the past month he has been waking up in the morning with stool or urine in his bed and no recollection of how he soiled himself, though again he denies tongue bites or waking up confused.  This was initially happening every few days but has recently increased to being a near nightly event.  He denies any issue with incontinence when he is awake, but does note that he feels like his walking has been generally slower and he has had some bilateral leg weakness.  He does feel like his leg weakness has been improving while he has been in the hospital the past few days.  He denies any saddle anesthesia, numbness, or focal weakness anywhere.  He additionally denies any significant headaches or vision changes.  He presented to the ED on 6/13 with worsening respiratory symptoms including the development of hemoptysis, as well as abdominal pain.  On EMS arrival he was tachypneic and hypoxic with O2 sats in the low 80s and met sepsis criteria on arrival to the ED.   Work-up revealed increase in his right upper lobe cavitary mass from 7.8 cm (5/18) to 9.9 cm, as well as small bowel intussusception in the mid left abdomen without obstruction (the latter appears incidental and is of being managed conservatively with improvement in his abdominal pain).  He was initially started on vancomycin, cefepime, metronidazole as well as stress dose steroids.  Infectious diseases, pulmonary, oncology are all following.  He is now on linezolid with broad infectious work-up pending, possible plan for bronchoscopy versus thoracic surgery.  Notably he was last admitted 5/18-5/26 with a similar presentation, with sputum culture growing MRSA for which she was started on IV Zosyn and IV vancomycin with transition to oral doxycycline with plans to treat for 4 to 6 weeks.  Hospital course is additionally complicated by adrenal insufficiency for which his home prednisone was doubled and he was treated with Granix for his neutropenia.  Additional history is provided by the patient's brother Mr. Amarrion Pastorino.  Marcus [name] reports that the patient has been quite ill for some time now and rarely gets out of bed.  He reports that the incontinence has been constant and not just overnight.  They did try to set up a bedside commode once it arrived the week prior to the patient's most recent admission, however the patient has not been using it.  Marcus [name] confirmed that the patient lives with their 73 year old mother, who actually provides a lot of caregiving for the patient and has been trying to get him to wear diapers.  Marcus [name]  notes that his brother Jeneen Rinks was there during the shaking event and may be able to provide more history about it, unfortunately Jeneen Rinks did not answer my call today.  ROS: All other review of systems was negative except as noted in the HPI.   Past Medical History:  Diagnosis Date   COPD (chronic obstructive pulmonary disease) (HCC)    Felty syndrome (HCC)    Hernia, epigastric     Pancytopenia (HCC)    Seropositive rheumatoid arthritis ([place])    Tobacco use disorder    Past Surgical History:  Procedure Laterality Date   INCISIONAL HERNIA REPAIR  01/17/2020   Procedure: HERNIA REPAIR INCISIONAL AND Silvestre Gunner;  Surgeon: Kinsinger, Arta Bruce, MD;  Location: [facility];  Service: General;;   LAPAROTOMY N/A 01/17/2020   Procedure: EXPLORATORY LAPAROTOMY;  Surgeon: Mickeal Skinner, MD;  Location: [facility];  Service: General;  Laterality: N/A;   Current Outpatient Medications  Medication Instructions   albuterol (PROVENTIL) 2.5 mg, Nebulization, Every 6 hours PRN   albuterol (VENTOLIN HFA) 108 (90 Base) MCG/ACT inhaler 2 puffs, Inhalation, Every 6 hours PRN   doxycycline (VIBRA-TABS) 100 mg, Oral, Every 12 hours   feeding supplement (ENSURE ENLIVE / ENSURE PLUS) LIQD 237 mLs, Oral, 2 times daily between meals   guaiFENesin-dextromethorphan (ROBITUSSIN DM) 100-10 MG/5ML syrup 5 mLs, Oral, Every 4 hours PRN   mometasone-formoterol (DULERA) 100-5 MCG/ACT AERO 2 puffs, Inhalation, 2 times daily   pantoprazole (PROTONIX) 40 mg, Oral, Daily   predniSONE (DELTASONE) 10 mg, Oral, 2 times daily with meals    Current Facility-Administered Medications:    acetaminophen (TYLENOL) tablet 650 mg, 650 mg, Oral, Q6H PRN **OR** acetaminophen (TYLENOL) suppository 650 mg, 650 mg, Rectal, Q6H PRN, Para Skeans, MD   benzonatate (TESSALON) capsule 200 mg, 200 mg, Oral, BID PRN, Judd Gaudier V, MD, 200 mg at 09/18/20 2150   chlorpheniramine-HYDROcodone (TUSSIONEX) 10-8 MG/5ML suspension 5 mL, 5 mL, Oral, Q12H PRN, Athena Masse, MD   heparin injection 5,000 Units, 5,000 Units, Subcutaneous, Q8H, Para Skeans, MD, 5,000 Units at 09/19/20 1884   ipratropium-albuterol (DUONEB) 0.5-2.5 (3) MG/3ML nebulizer solution 3 mL, 3 mL, Nebulization, Q4H PRN, Judd Gaudier V, MD, 3 mL at 09/18/20 2208   [START ON 09/20/2020] linezolid (ZYVOX) IVPB 600 mg, 600 mg, Intravenous, Q12H, Ravishankar,  Jayashree, MD   nicotine (NICODERM CQ - dosed in mg/24 hours) patch 21 mg, 21 mg, Transdermal, Daily, Florina Ou V, MD, 21 mg at 09/18/20 1319   potassium chloride SA (KLOR-CON) CR tablet 40 mEq, 40 mEq, Oral, Once, Lorella Nimrod, MD   predniSONE (DELTASONE) tablet 10 mg, 10 mg, Oral, BID WC, Para Skeans, MD, 10 mg at 09/18/20 1808   Family History  Problem Relation Age of Onset   CAD Brother    COPD Brother     Social History:  reports that he has been smoking cigarettes. He has been smoking an average of 1.00 packs per day. He has never used smokeless tobacco. He reports current drug use. Drug: Marijuana. He reports that he does not drink alcohol.  To me he confirms that he is still smoking a pack per day but he reports he no longer uses marijuana   Exam: Current vital signs: BP (!) 102/58 (BP Location: Right Arm)   Pulse 70   Temp (!) 97.2 F (36.2 C) (Oral)   Resp 18   SpO2 93%  Vital signs in last 24 hours: Temp:  [97.2 F (36.2 C)-97.4  F (36.3 C)] 97.2 F (36.2 C) (06/15 0500) Pulse Rate:  [70-93] 70 (06/15 0500) Resp:  [18-29] 18 (06/15 0500) BP: (90-103)/(58-69) 102/58 (06/15 0500) SpO2:  [93 %-99 %] 93 % (06/15 0500)   Physical Exam  Constitutional: Chronically ill and cachectic appearing, intermittently coughing up thick bloody sputum Psych: Affect appropriate to situation, calm and cooperative Eyes: No scleral injection HENT: No oropharyngeal obstruction.  Poor dentition MSK: Musculoskeletal changes consistent with rheumatoid arthritis especially in the bilateral hands Cardiovascular: Normal rate and regular rhythm.  Respiratory: Tachypneic with intermittent wheezing and coughing GI: Soft.  No distension. There is no tenderness.  GU: Condom cath in place Skin: Warm dry and intact visible skin  Neuro: Mental Status: Patient is awake, alert, oriented to person, place, month, year, and situation. Patient is able to give a clear and coherent history. No  signs of aphasia or neglect Cranial Nerves: II: Visual Fields are full. Pupils are equal, round, and reactive to light.   III,IV, VI: EOMI without ptosis or diploplia.  V: Facial sensation is symmetric to temperature VII: Facial movement is symmetric.  VIII: hearing is intact to voice X: Uvula elevates symmetrically XI: Shoulder shrug is symmetric. XII: tongue is midline without atrophy or fasciculations.  Motor: Tone is normal. Bulk is notable for diffuse muscle wasting.  4+/5 strength throughout except for bilateral hip flexion 4-/5 Sensory: Sensation is symmetric to light touch in the arms and legs. Deep Tendon Reflexes: 3+ and symmetric in the biceps and patellae.  Cerebellar: FNF and HKS are intact bilaterally  I have reviewed labs in epic and the results pertinent to this consultation are:   Basic Metabolic Panel: Recent Labs  Lab 09/17/20 1345 09/18/20 0533 09/19/20 0506  NA 131* 131* 136  K 3.3* 3.6 3.4*  CL 96* 104 103  CO2 '28 26 24  ' GLUCOSE 137* 72 163*  BUN 33* 20 14  CREATININE 0.78 0.48* 0.48*  CALCIUM 7.9* 7.2* 7.8*  MG  --   --  1.8    CBC: Recent Labs  Lab 09/17/20 1345 09/18/20 0533 09/19/20 0506  WBC 3.1* 4.9 3.7*  NEUTROABS 1.5*  --  2.5  HGB 12.9* 9.8* 9.8*  HCT 40.2 29.7* 29.0*  MCV 84.1 83.4 82.4  PLT 190 203 256    Coagulation Studies: Recent Labs    09/17/20 1345  LABPROT 14.0  INR 1.1    I have reviewed the images obtained: MRI brain, personally reviewed.  Motion limited but no acute intracranial process, including no abnormal enhancement.  There are white matter changes consistent with chronic small vessel disease, chronic left lacunar basal ganglia infarct and a few chronic microhemorrhages likely hypertension related, with a midline cerebellar small chronic hemorrhage versus cavernoma  Impression: This is a 60 year old immunosuppressed gentleman with a cavitary lung lesion.  Regarding the shaking event he had prior to  admission, I have low concern that this was seizure activity given involvement of all 4 extremities with retained awareness.  Description sounds more likely to be myoclonus, potentially triggered by metabolic abnormality such as hypoxia, though it is difficult to be sure given that event occurred only once and there were no other witnesses to corroborate patient's story.  However his nighttime incontinence is concerning for potential seizure activity though again a bit atypical that it is happening now almost nightly without any known events during the day (though history of this being only a nightly process is disputed by family as noted above).  MRI brain  is reassuring against significant acute intracranial process at this time though it was motion limited.  Additionally his mental status, lack of headache, lack of visual symptoms are reassuring against CNS infection at this time.  However, I will obtain MRI cervical spine and thoracic spine given his worsening bilateral lower extremity weakness and hyperreflexia throughout (rheumatoid arthritis in particular can be associated with a C-spine pannus); spinal cord compression could additionally contribute to incontinence.  Recommendations: - Routine EEG - MRI C-spine and T-spine w/ and w/o contrast - Neurology will continue to follow  [place] [phone] [facility] coverage for [facility] is from 8 AM to 4 AM in-house and 4 PM to 8 PM by telephone/video. 8 PM to 8 AM emergent questions or overnight urgent questions should be addressed to Teleneurology On-call or [facility] neurohospitalist; contact information can be found on AMION

## 2020-09-19 NOTE — Evaluation (Signed)
Physical Therapy Evaluation Patient Details Name: Marcus [name] MRN: [id number] DOB: January 13, 1961 Today's Date: 09/19/2020   History of Present Illness  60 y/o M with PMH: tobacco use d/o, RA, pancytopenia, COPD (on chronic 2Lnc), and Felty syndrome who presented to ED with c/o SOB and cough x1 month.  He was here last month with similar and returns with increased coughing with bright red blood production. CT scan showed cavitary lesion in the right upper lobe.  Suspect lung abscess. Pulminology following. h/o hypothermic during hospitalizations (thought to be 2/2 relative adrenal insufficiency.)  Clinical Impression  Pt eager to work PT but ultimately was very limited with what he was able to do.  He was slow to get to EOB but did not need assist to do so, however he displayed considerable LE weakness and struggled with getting to standing.  Stagger stepping/unsteadiness with turning to get to recliner, encouraged weight shift to walker and improved posture - once steady PT encouraged ambulation; pt made good effort but fatigued out after just a few slow, guarded and walker reliant steps, just making it to the foot of the bed with much fatigue.      Follow Up Recommendations SNF (pt wants to go home)    Equipment Recommendations  Rolling walker with 5" wheels;3in1 (PT)    Recommendations for Other Services       Precautions / Restrictions Precautions Precautions: Fall Restrictions Weight Bearing Restrictions: No      Mobility  Bed Mobility Overal bed mobility: Needs Assistance Bed Mobility: Supine to Sit     Supine to sit: Min guard     General bed mobility comments: slow to get to EOB, but did not require direct assist    Transfers Overall transfer level: Needs assistance Equipment used: Rolling walker (2 wheeled);None Transfers: Sit to/from Stand Sit to Stand: Min guard;Min assist         General transfer comment: unable to rise on first attempt 2/2 LE weakness, cuing  for hand placement and sequencing with very light assist  Ambulation/Gait Ambulation/Gait assistance: Min assist Gait Distance (Feet): 6 Feet Assistive device: Rolling walker (2 wheeled)       General Gait Details: Pt much more limited with ambulation than he was on last admission 3 weeks ago.  He struggled to turn and get to recliner, with encouragement he was able to take a few steps forward with hesitant, slow and closely guarded gait.  Quick to fatigue, O2 dropped low 90s to mid 80s on 3.5 L with the effort.  Stairs            Wheelchair Mobility    Modified Rankin (Stroke Patients Only)       Balance Overall balance assessment: Needs assistance Sitting-balance support: Bilateral upper extremity supported;Feet supported Sitting balance-Leahy Scale: Good     Standing balance support: Bilateral upper extremity supported;During functional activity Standing balance-Leahy Scale: Poor Standing balance comment: high fall risk without BUE support. Pt did not use RW or AD at baseline                             Pertinent Vitals/Pain Pain Location:  (general soreness with constant coughing)    Home Living Family/patient expects to be discharged to:: Private residence Living Arrangements: Parent Available Help at Discharge: Family Type of Home: House Home Access: Stairs to enter Entrance Stairs-Rails: Right;Left;Can reach both Entrance Stairs-Number of Steps: 4 Home Layout: One level Home Equipment:  Walker - 2 wheels;Cane - single point      Prior Function Level of Independence: Independent         Comments: Pt lives with his 41 y/o mother and states she is able to take care of herself and get around. States that they share house responsabilities. Community and [place] amb wtih no AD. Reports no recent falls. Per CM note (with conversation from brother), pt has been bedbound, toileting on himself, and hasn't been eating much since March.     Hand Dominance         Extremity/Trunk Assessment   Upper Extremity Assessment Upper Extremity Assessment: Generalized weakness    Lower Extremity Assessment Lower Extremity Assessment: Generalized weakness       Communication      Cognition Arousal/Alertness: Awake/alert Behavior During Therapy: WFL for tasks assessed/performed Overall Cognitive Status: Within Functional Limits for tasks assessed                                 General Comments: Pt is A and O x 4      General Comments      Exercises     Assessment/Plan    PT Assessment Patient needs continued PT services  PT Problem List Decreased strength;Decreased activity tolerance;Decreased balance;Decreased mobility;Cardiopulmonary status limiting activity;Decreased safety awareness;Pain       PT Treatment Interventions DME instruction;Gait training;Stair training;Functional mobility training;Therapeutic activities;Patient/family education;Balance training;Therapeutic exercise;Manual techniques    PT Goals (Current goals can be found in the Care Plan section)  Acute Rehab PT Goals Patient Stated Goal: home    Frequency Min 2X/week   Barriers to discharge        Co-evaluation               AM-PAC PT "6 Clicks" Mobility  Outcome Measure Help needed turning from your back to your side while in a flat bed without using bedrails?: A Little Help needed moving from lying on your back to sitting on the side of a flat bed without using bedrails?: A Little Help needed moving to and from a bed to a chair (including a wheelchair)?: A Little Help needed standing up from a chair using your arms (e.g., wheelchair or bedside chair)?: A Little Help needed to walk in hospital room?: A Lot Help needed climbing 3-5 steps with a railing? : Total 6 Click Score: 15    End of Session Equipment Utilized During Treatment: Gait belt Activity Tolerance: Patient limited by fatigue Patient left: with call bell/phone within  reach;in chair;with chair alarm set Nurse Communication: Mobility status PT Visit Diagnosis: Unsteadiness on feet (R26.81);Muscle weakness (generalized) (M62.81);Difficulty in walking, not elsewhere classified (R26.2)    Time: 0263-7858 PT Time Calculation (min) (ACUTE ONLY): 31 min   Charges:   PT Evaluation $PT Eval Low Complexity: 1 Low PT Treatments $Therapeutic Activity: 8-22 mins        Kreg Shropshire, DPT 09/19/2020, 1:21 PM

## 2020-09-19 NOTE — Procedures (Signed)
Patient Name: Marcus [name]  MRN: [id number]  Epilepsy Attending: Lora Havens  Referring Physician/Provider: Dr Lesleigh Noe Date: 09/19/2020 Duration: 24.27 mins  Patient history: 60 year old male with altered mental status.  EEG to evaluate for seizures.  Level of alertness: Awake, asleep  AEDs during EEG study: None  Technical aspects: This EEG study was done with scalp electrodes positioned according to the 10-20 International system of electrode placement. Electrical activity was acquired at a sampling rate of 500Hz  and reviewed with a high frequency filter of 70Hz  and a low frequency filter of 1Hz . EEG data were recorded continuously and digitally stored.   Description: The posterior dominant rhythm consists of 8-9 Hz activity of moderate voltage (25-35 uV) seen predominantly in posterior head regions, symmetric and reactive to eye opening and eye closing. Sleep was characterized by vertex waves, sleep spindles (12 to 14 Hz), maximal frontocentral region. Physiologic photic driving was not seen during photic stimulation. Hyperventilation was not performed.     IMPRESSION: This study is within normal limits. No seizures or epileptiform discharges were seen throughout the recording.  Marcus [name]

## 2020-09-19 NOTE — Progress Notes (Addendum)
Pt still coughing out blood and have an order for heparin subq injection. MD Mansy made aware and states to hold heparin and order mucinex  12 hr 600 mg tablet twice a day.. Will continue to monitor.  Update 2220: Per MD Mansy ordered to dicontinue heparin subq injection.Will continue to monitor.

## 2020-09-19 NOTE — Consult Note (Signed)
Pulmonary and Critical Care Medicine          Date: 09/19/2020,   MRN# [id number] JADIAN KARMAN [birth year]     Admission                  Current    Referring physician: Dr Loleta Books   CHIEF COMPLAINT:   Pneumonia of right lung with cavitary mass   HISTORY OF PRESENT ILLNESS   60 yo M w/ hx of COPD, Feltys syndrome (enlarged spleen, low WBC count associated with RA), epigastric hernia and rheumatoid arthritis.  Patient had admission 01/2020 to [facility] with findings of pneumoperitoneum he was intubated during this time, had procedure done with graham patch incision hernia repair s/p exlap. He had MRSA pneumonia 08/2020, he has been on antibiotics with doxycycline on outpatient. He came in to ER with complaints of hemoptysis.  PCCM consultation for additional evaluation and management. Patient reports having visual symptoms and seizure like activity few weeks ago.  09/19/20- patient seems slightly improved, his microbiology is still in process. Currently on empiric therapy.  MRSA is +.  Hemoptysis has slowed down.  PAST MEDICAL HISTORY   Past Medical History:  Diagnosis Date  . COPD (chronic obstructive pulmonary disease) ([place])   . 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 Hernia, epigastric   . Pancytopenia ([place])   . Seropositive rheumatoid arthritis ([place])   . Tobacco use disorder      SURGICAL HISTORY   Past Surgical History:  Procedure Laterality Date  . INCISIONAL HERNIA REPAIR  01/17/2020   Procedure: HERNIA REPAIR INCISIONAL AND Silvestre Gunner;  Surgeon: Kinsinger, Arta Bruce, MD;  Location: [facility];  Service: General;;  . LAPAROTOMY N/A 01/17/2020   Procedure: EXPLORATORY LAPAROTOMY;  Surgeon: Kieth Brightly Arta Bruce, MD;  Location: [facility];  Service: General;  Laterality: N/A;     FAMILY HISTORY   Family History  Problem Relation Age of Onset  . CAD Brother   . COPD Brother      SOCIAL HISTORY   Social History   Tobacco Use  . Smoking status: Every Day     Packs/day: 1.00    Pack years: 0.00    Types: Cigarettes  . Smokeless tobacco: Never  Substance Use Topics  . Alcohol use: No    Alcohol/week: 0.0 standard drinks  . Drug use: Yes    Types: Marijuana     MEDICATIONS    Home Medication:  REM   Current Medication:  Current Facility-Administered Medications:  .  acetaminophen (TYLENOL) tablet 650 mg, 650 mg, Oral, Q6H PRN **OR** acetaminophen (TYLENOL) suppository 650 mg, 650 mg, Rectal, Q6H PRN, Para Skeans, MD .  benzonatate (TESSALON) capsule 200 mg, 200 mg, Oral, BID PRN, Athena Masse, MD, 200 mg at 09/18/20 2150 .  chlorpheniramine-HYDROcodone (TUSSIONEX) 10-8 MG/5ML suspension 5 mL, 5 mL, Oral, Q12H PRN, Athena Masse, MD .  heparin injection 5,000 Units, 5,000 Units, Subcutaneous, Q8H, Para Skeans, MD, 5,000 Units at 09/19/20 [phone] .  ipratropium-albuterol (DUONEB) 0.5-2.5 (3) MG/3ML nebulizer solution 3 mL, 3 mL, Nebulization, Q4H PRN, Athena Masse, MD, 3 mL at 09/18/20 2208 .  [START ON 09/20/2020] linezolid (ZYVOX) IVPB 600 mg, 600 mg, Intravenous, Q12H, Ravishankar, Jayashree, MD .  nicotine (NICODERM CQ - dosed in mg/24 hours) patch 21 mg, 21 mg, Transdermal, Daily, Florina Ou V, MD, 21 mg at 09/18/20 1319 .  potassium chloride SA (KLOR-CON) CR tablet 40 mEq, 40 mEq, Oral, Once, Amin,  Soundra Pilon, MD .  predniSONE (DELTASONE) tablet 10 mg, 10 mg, Oral, BID WC, Para Skeans, MD, 10 mg at 09/18/20 1808    ALLERGIES   Levaquin [levofloxacin in d5w] and Methotrexate     REVIEW OF SYSTEMS    Review of Systems:  Gen:  Denies  fever, sweats, chills weigh loss  HEENT: Denies blurred vision, double vision, ear pain, eye pain, hearing loss, nose bleeds, sore throat Cardiac:  No dizziness, chest pain or heaviness, chest tightness,edema Resp:   reports dyspnea/sob/chest pain worse on right, hemoptysis  Gi: Denies swallowing difficulty, stomach pain, nausea or vomiting, diarrhea, constipation, bowel  incontinence Gu:  Denies bladder incontinence, burning urine Ext:   Denies Joint pain, stiffness or swelling Skin: Denies  skin rash, easy bruising or bleeding or hives Endoc:  Denies polyuria, polydipsia , polyphagia or weight change Psych:   Denies depression, insomnia or hallucinations   Other:  All other systems negative   VS: BP (!) 102/58 (BP Location: Right Arm)   Pulse 70   Temp (!) 97.2 F (36.2 C) (Oral)   Resp 18   SpO2 93%      PHYSICAL EXAM    GENERAL:NAD, no fevers, chills, no weakness no fatigue HEAD: Normocephalic, atraumatic.  EYES: Pupils equal, round, reactive to light. Extraocular muscles intact. No scleral icterus.  MOUTH: Moist mucosal membrane. Dentition intact. No abscess noted.  EAR, NOSE, THROAT: Clear without exudates. No external lesions.  NECK: Supple. No thyromegaly. No nodules. No JVD.  PULMONARY: rhonchi at right upper lung zone CARDIOVASCULAR: S1 and S2. Regular rate and rhythm. No murmurs, rubs, or gallops. No edema. Pedal pulses 2+ bilaterally.  GASTROINTESTINAL: Soft, nontender, nondistended. No masses. Positive bowel sounds. No hepatosplenomegaly.  MUSCULOSKELETAL: No swelling, clubbing, or edema. Range of motion full in all extremities.  NEUROLOGIC: Cranial nerves II through XII are intact. No gross focal neurological deficits. Sensation intact. Reflexes intact.  SKIN: No ulceration, lesions, rashes, or cyanosis. Skin warm and dry. Turgor intact.  PSYCHIATRIC: Mood, affect within normal limits. The patient is awake, alert and oriented x 3. Insight, judgment intact.       IMAGING    DG Chest 2 View  Result Date: 09/06/2020 CLINICAL DATA:  Shortness of breath.  Cough. EXAM: CHEST - 2 VIEW COMPARISON:  Chest x-ray 08/28/2020.  CT 08/22/2020. FINDINGS: Mediastinum unremarkable. Heart size normal. Cavitary process in the right mid lung again noted. No change identified. No pleural effusion or pneumothorax. Old right rib fractures.  IMPRESSION: Cavitary process in the right mid lung again noted. Cavitary process best identified by prior CT. Chest is unchanged from prior exam. Electronically Signed   By: Marcello Moores  Register   On: 09/06/2020 14:04   CT Chest Wo Contrast  Result Date: 08/22/2020 CLINICAL DATA:  Abnormal x-ray. Interstitial infiltrates with possible right perihilar cavitation. Cough and shortness of breath. EXAM: CT CHEST WITHOUT CONTRAST TECHNIQUE: Multidetector CT imaging of the chest was performed following the standard protocol without IV contrast. COMPARISON:  Radiographs 08/22/2020 and 08/04/2020.  CT 06/10/2020. FINDINGS: Cardiovascular: Extensive three-vessel coronary artery atherosclerosis with lesser involvement of the aorta and great vessels. The heart size is normal. There is no pericardial effusion. Mediastinum/Nodes: Multiple small mediastinal and hilar lymph nodes have enlarged compared with the chest CT of 2 months ago, likely reactive. No axillary lymphadenopathy. The thyroid gland, trachea and esophagus demonstrate no significant findings. Lungs/Pleura: There is no pleural effusion or pneumothorax. Moderate centrilobular and paraseptal emphysema. As seen on the earlier  radiographs, there is a new cavitary mass inferomedially in the right upper lobe, measuring up to 7.8 x 5.5 cm on image 79/4. This has irregular thick walls and associated air-fluid levels. There was no abnormality in this area on the relatively recent prior chest CT, and this is most consistent with cavitating pneumonia. There is an additional cavitary lesion with an air-fluid level medially in the left upper lobe, measuring up to 2.4 cm on image 65/4. There is mild patchy airspace disease surrounding these cavitary infiltrates. Upper abdomen: Small calcified gallstones. No evidence of biliary dilatation or adrenal mass. Postsurgical changes in the anterior abdominal wall consistent with previous hernia repair. Musculoskeletal/Chest wall: There is  no chest wall mass or suspicious osseous finding. There are old fractures of the right 5th and 6th ribs posterolaterally which appear unchanged. IMPRESSION: 1. CT confirms the presence of a cavitary infiltrate in the right upper lobe and shows a smaller area of cavitation anteriorly in the left upper lobe. No abnormalities were present in these areas on CT performed 2 months ago to suggest cavitary neoplasm, and these findings are consistent with cavitating/necrotizing pneumonia. 2. Mildly enlarged mediastinal and hilar lymph nodes, likely reactive. 3. No associated pleural disease. 4. Cholelithiasis. 5. Underlying moderate Emphysema (ICD10-J43.9). 6. Coronary and Aortic Atherosclerosis (ICD10-I70.0). Electronically Signed   By: Richardean Sale M.D.   On: 08/22/2020 14:26   CT Angio Chest PE W and/or Wo Contrast  Result Date: 09/17/2020 CLINICAL DATA:  Hemoptysis, shortness of breath, abdominal distension EXAM: CT ANGIOGRAPHY CHEST CT ABDOMEN AND PELVIS WITH CONTRAST TECHNIQUE: Multidetector CT imaging of the chest was performed using the standard protocol during bolus administration of intravenous contrast. Multiplanar CT image reconstructions and MIPs were obtained to evaluate the vascular anatomy. Multidetector CT imaging of the abdomen and pelvis was performed using the standard protocol during bolus administration of intravenous contrast. CONTRAST:  9mL OMNIPAQUE IOHEXOL 350 MG/ML SOLN COMPARISON:  CT 06/20/2020, 06/22/2020 FINDINGS: CTA CHEST FINDINGS Cardiovascular: Satisfactory opacification of the pulmonary arteries to the segmental level. No evidence of pulmonary embolism. Thoracic aorta is normal in course and caliber. Atherosclerotic calcifications of the aorta and coronary arteries. Normal heart size. No pericardial effusion. Mediastinum/Nodes: Mildly prominent mediastinal lymph nodes are again noted, likely reactive. No axillary or hilar lymphadenopathy. Lungs/Pleura: Redemonstrated cavitary mass  located within the inferomedial aspect of the right upper lobe measuring approximately 9.9 x 6.6 x 7.4 cm (previously measured approximately 7.8 x 4.5 x 5.5 cm on 08/22/2020. Increased surrounding consolidation and ground-glass opacity within the adjacent right upper lobe and right middle lobe previously seen cavitary lesion within the medial aspect of the left upper lobe has resolved with small thin walled pleural cyst remaining (series 6, image 44). Scattered areas of ground-glass opacity within the bilateral lower lobes and upper lobes. Background of moderate emphysema. No pleural effusion or pneumothorax. Musculoskeletal: Remote right sixth and seventh rib fractures. No new or acute osseous findings. No chest wall abnormality. Review of the MIP images confirms the above findings. CT ABDOMEN and PELVIS FINDINGS Hepatobiliary: Cholelithiasis without evidence of pericholecystic inflammation by CT. Liver within normal limits. No biliary dilatation. Pancreas: Unremarkable. No pancreatic ductal dilatation or surrounding inflammatory changes. Spleen: Prior splenectomy. Adrenals/Urinary Tract: Unremarkable adrenal glands. Kidneys enhance symmetrically without focal lesion, stone, or hydronephrosis. Ureters are nondilated. Urinary bladder appears unremarkable. Stomach/Bowel: Stomach is within normal limits. Appendix appears normal (series 2, image 58). Incidental note of a small bowel-small bowel intussusception within the mid left abdomen (series 2,  image 38). No evidence of bowel wall thickening, distention, or inflammatory changes. Vascular/Lymphatic: Fusiform infrarenal abdominal aortic aneurysm measuring up to 3.1 cm, unchanged. Aortoiliac atherosclerosis. No abdominopelvic lymphadenopathy. Reproductive: Prostate is unremarkable. Other: No free fluid. No abdominopelvic fluid collection. No pneumoperitoneum. Fat containing midline supraumbilical hernia without evidence of complication. Musculoskeletal: No acute or  significant osseous findings. Review of the MIP images confirms the above findings. IMPRESSION: 1. No evidence of acute pulmonary embolism. 2. Enlarging cavitary mass within the inferomedial aspect of the right upper lobe measuring up to 9.9 cm (previously measured approximately 7.8 x 4.5 x 5.5 cm on 08/22/2020). Increased surrounding consolidation and ground-glass opacity within the adjacent right upper lobe and right middle lobe. Findings remain most compatible with cavitary/necrotizing pneumonia. 3. Scattered areas of ground-glass opacity within the bilateral lower lobes and upper lobes, concerning for multifocal infection. 4. Interval resolution of previously seen cavitary lesion within the medial aspect of the left upper lobe with small thin walled pleural cyst remaining. 5. Cholelithiasis without evidence of pericholecystic inflammation by CT. 6. Incidental note of a small bowel-small bowel intussusception within the mid left abdomen. No evidence of bowel obstruction. 7. Fat containing midline supraumbilical hernia without evidence of complication. 8. Stable infrarenal abdominal aortic aneurysm measuring up to 3.1 cm. Recommend follow-up ultrasound every 3 years. This recommendation follows ACR consensus guidelines: White Paper of the ACR Incidental Findings Committee II on Vascular Findings. J Am Coll Radiol 2013; 10:789-794. Aortic Atherosclerosis (ICD10-I70.0) and Emphysema (ICD10-J43.9). Electronically Signed   By: Davina Poke D.O.   On: 09/17/2020 16:38   MR BRAIN W WO CONTRAST  Result Date: 09/18/2020 CLINICAL DATA:  Brain mass or lesion. 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 Recent MRSA pneumonia. Patient reports visual symptoms and seizure-like activity a few weeks ago. EXAM: MRI HEAD WITHOUT AND WITH CONTRAST TECHNIQUE: Multiplanar, multiecho pulse sequences of the brain and surrounding structures were obtained without and  with intravenous contrast. CONTRAST:  7mL GADAVIST GADOBUTROL 1 MMOL/ML IV SOLN COMPARISON:  No pertinent prior exams available for comparison. FINDINGS: Brain: Intermittently motion degraded examination, limiting evaluation. Most notably, there is moderate/severe motion degradation of the sagittal T1 weighted sequence, moderate/severe motion degradation of the axial T2/FLAIR sequence, moderate motion degradation of the axial T1 weighted postcontrast sequence and moderate motion degradation of the coronal T1 weighted postcontrast sequence. Mild generalized cerebral and cerebellar atrophy. Advanced patchy and confluent T2/FLAIR hyperintensity within the cerebral white matter, nonspecific but most often secondary to chronic small vessel ischemia. Chronic lacunar infarct within the left lentiform nucleus. Small foci of SWI signal loss within the left basal ganglia and along the margin of the posterior right lateral ventricle, likely reflecting chronic microhemorrhages. Additional subcentimeter focus of SWI signal loss within the midline cerebellum, which may reflect an additional chronic microhemorrhage or cavernoma. There is no acute infarct. No evidence of intracranial mass. No extra-axial fluid collection. No midline shift. Within the limitations of motion degradation, no abnormal intracranial enhancement is identified. Vascular: Expected proximal arterial flow voids. Non dominant intracranial right vertebral artery. Skull and upper cervical spine: Within the limitations of motion degradation, no focal marrow lesion is identified. Sinuses/Orbits: Visualized orbits show no acute finding. 1.9 cm right maxillary sinus mucous retention cyst. Trace left maxillary sinus mucosal thickening. Other: Trace bilateral mastoid effusions. 2.8 cm ovoid lesion within the left suboccipital scalp demonstrating heterogeneous but predominantly T2 hyperintense signal, T1 hypointense signal, restricted diffusion and no convincing  abnormal  enhancement. This is favored to reflect a sebaceous/epidermoid cyst. IMPRESSION: Motion degraded examination, as described and limiting evaluation. No evidence of acute intracranial abnormality. Severe patchy and confluent T2/FLAIR hyperintensity within the cerebral white matter, nonspecific but most often secondary to chronic small vessel ischemia. Chronic left basal ganglia lacunar infarct. Subcentimeter focus of SWI signal loss within the midline cerebellum, which may reflect a chronic microhemorrhage or cavernoma. Mild generalized parenchymal atrophy. Incidentally noted 1.9 cm right maxillary sinus mucous retention cyst. Trace bilateral mastoid effusions. Electronically Signed   By: Kellie Simmering DO   On: 09/18/2020 15:59   CT ABDOMEN PELVIS W CONTRAST  Result Date: 09/17/2020 CLINICAL DATA:  Hemoptysis, shortness of breath, abdominal distension EXAM: CT ANGIOGRAPHY CHEST CT ABDOMEN AND PELVIS WITH CONTRAST TECHNIQUE: Multidetector CT imaging of the chest was performed using the standard protocol during bolus administration of intravenous contrast. Multiplanar CT image reconstructions and MIPs were obtained to evaluate the vascular anatomy. Multidetector CT imaging of the abdomen and pelvis was performed using the standard protocol during bolus administration of intravenous contrast. CONTRAST:  62mL OMNIPAQUE IOHEXOL 350 MG/ML SOLN COMPARISON:  CT 06/20/2020, 06/22/2020 FINDINGS: CTA CHEST FINDINGS Cardiovascular: Satisfactory opacification of the pulmonary arteries to the segmental level. No evidence of pulmonary embolism. Thoracic aorta is normal in course and caliber. Atherosclerotic calcifications of the aorta and coronary arteries. Normal heart size. No pericardial effusion. Mediastinum/Nodes: Mildly prominent mediastinal lymph nodes are again noted, likely reactive. No axillary or hilar lymphadenopathy. Lungs/Pleura: Redemonstrated cavitary mass located within the inferomedial aspect of the  right upper lobe measuring approximately 9.9 x 6.6 x 7.4 cm (previously measured approximately 7.8 x 4.5 x 5.5 cm on 08/22/2020. Increased surrounding consolidation and ground-glass opacity within the adjacent right upper lobe and right middle lobe previously seen cavitary lesion within the medial aspect of the left upper lobe has resolved with small thin walled pleural cyst remaining (series 6, image 44). Scattered areas of ground-glass opacity within the bilateral lower lobes and upper lobes. Background of moderate emphysema. No pleural effusion or pneumothorax. Musculoskeletal: Remote right sixth and seventh rib fractures. No new or acute osseous findings. No chest wall abnormality. Review of the MIP images confirms the above findings. CT ABDOMEN and PELVIS FINDINGS Hepatobiliary: Cholelithiasis without evidence of pericholecystic inflammation by CT. Liver within normal limits. No biliary dilatation. Pancreas: Unremarkable. No pancreatic ductal dilatation or surrounding inflammatory changes. Spleen: Prior splenectomy. Adrenals/Urinary Tract: Unremarkable adrenal glands. Kidneys enhance symmetrically without focal lesion, stone, or hydronephrosis. Ureters are nondilated. Urinary bladder appears unremarkable. Stomach/Bowel: Stomach is within normal limits. Appendix appears normal (series 2, image 58). Incidental note of a small bowel-small bowel intussusception within the mid left abdomen (series 2, image 38). No evidence of bowel wall thickening, distention, or inflammatory changes. Vascular/Lymphatic: Fusiform infrarenal abdominal aortic aneurysm measuring up to 3.1 cm, unchanged. Aortoiliac atherosclerosis. No abdominopelvic lymphadenopathy. Reproductive: Prostate is unremarkable. Other: No free fluid. No abdominopelvic fluid collection. No pneumoperitoneum. Fat containing midline supraumbilical hernia without evidence of complication. Musculoskeletal: No acute or significant osseous findings. Review of the MIP  images confirms the above findings. IMPRESSION: 1. No evidence of acute pulmonary embolism. 2. Enlarging cavitary mass within the inferomedial aspect of the right upper lobe measuring up to 9.9 cm (previously measured approximately 7.8 x 4.5 x 5.5 cm on 08/22/2020). Increased surrounding consolidation and ground-glass opacity within the adjacent right upper lobe and right middle lobe. Findings remain most compatible with cavitary/necrotizing pneumonia. 3. Scattered areas of ground-glass opacity within the bilateral  lower lobes and upper lobes, concerning for multifocal infection. 4. Interval resolution of previously seen cavitary lesion within the medial aspect of the left upper lobe with small thin walled pleural cyst remaining. 5. Cholelithiasis without evidence of pericholecystic inflammation by CT. 6. Incidental note of a small bowel-small bowel intussusception within the mid left abdomen. No evidence of bowel obstruction. 7. Fat containing midline supraumbilical hernia without evidence of complication. 8. Stable infrarenal abdominal aortic aneurysm measuring up to 3.1 cm. Recommend follow-up ultrasound every 3 years. This recommendation follows ACR consensus guidelines: White Paper of the ACR Incidental Findings Committee II on Vascular Findings. J Am Coll Radiol 2013; 10:789-794. Aortic Atherosclerosis (ICD10-I70.0) and Emphysema (ICD10-J43.9). Electronically Signed   By: Davina Poke D.O.   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 There is increase in consolidation in this area compared to most recent study. Lungs elsewhere clear. Heart size and pulmonary vascular normal. No adenopathy appreciable. No bone lesions. IMPRESSION:  Cavitary mass right middle lobe anteriorly with increase in surrounding consolidation compared to most recent study. The lungs elsewhere are clear. Heart size normal. No adenopathy appreciable by radiography. Electronically Signed   By: Lowella Grip III M.D.   On: 09/17/2020 13:54   DG Chest Port 1 View  Result Date: 08/28/2020 CLINICAL DATA:  Hypoxia. EXAM: PORTABLE CHEST 1 VIEW COMPARISON:  CT 08/22/2020.  Chest x-ray 08/22/2020. FINDINGS: Mediastinum hilar structures normal. Heart size normal. Cavitary process in the right mid lung and left upper lung best identified by prior CT. No new findings are noted. No pleural effusion or pneumothorax. Heart size stable. Degenerative change thoracic spine. Old right rib fractures present. IMPRESSION: Cavitary process in the right mid lung and left upper lung best identified by prior CT. Chest is unchanged from prior exam. Electronically Signed   By: Marcello Moores  Register   On: 08/28/2020 08:25   DG Chest Portable 1 View  Result Date: 08/22/2020 CLINICAL DATA:  Shortness of breath EXAM: PORTABLE CHEST 1 VIEW COMPARISON:  August 04, 2020 FINDINGS: There is apparent scarring in the right perihilar region. There is suggestion of cavitation in this area in the right perihilar region. Slight scarring lateral right base. The lungs elsewhere are clear. Heart size and pulmonary vascularity are normal. No appreciable adenopathy. No bone lesions. IMPRESSION: Scarring right mid lung. Suggestion of developing cavitation in this area which could be due to overlapping of areas of scarring and normal structures. Given concern for cavitation in this area, noncontrast chest CT advised to further evaluate. Slight scarring lateral right base. Lungs elsewhere are clear. Heart size normal. No adenopathy evident by radiography. Electronically Signed   By: Lowella Grip III M.D.   On: 08/22/2020 13:24   DG Swallowing Func-Speech Pathology  Result Date: 09/06/2020 Please refer to  "Notes" tab for Speech Pathology notes.  ECHOCARDIOGRAM COMPLETE  Result Date: 08/28/2020    ECHOCARDIOGRAM REPORT   Patient Name:   HEZAKIAH CHAMPEAU [facility] Date of Exam: 08/28/2020 Medical Rec #:  [id number]    Height:       66.0 in Accession #:    [id number]   Weight:       133.6 lb Date of Birth:  Aug 04, 1960  BSA:          1.685 m Patient Age:    55 years     BP:           120/74 mmHg Patient Gender: M            HR:           76 bpm. 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 COPD. Tobacco use disorder.  Sonographer:     Sherrie Sport RDCS (AE) Referring Phys:  [id number] Sharen Hones Diagnosing Phys: Nelva Bush MD  Sonographer Comments: Technically challenging study due to limited acoustic windows. Image acquisition challenging due to COPD. IMPRESSIONS  1. Left ventricular ejection fraction, by estimation, is 55 to 60%. The left ventricle has normal function. Left ventricular endocardial border not optimally defined to evaluate regional wall motion. Left ventricular diastolic parameters are consistent with Grade I diastolic dysfunction (impaired relaxation).  2. Right ventricular systolic function is normal. The right ventricular size is mildly enlarged. Mildly increased right ventricular wall thickness. There is normal pulmonary artery systolic pressure.  3. The mitral valve is degenerative. Trivial mitral valve regurgitation. No evidence of mitral stenosis.  4. The aortic valve was not well visualized. Aortic valve regurgitation is not visualized. No aortic stenosis is present.  5. Pulmonic valve regurgitation not well assessed.  6. The inferior vena cava is normal in size with greater than 50% respiratory variability, suggesting right atrial pressure of 3 mmHg. Conclusion(s)/Recommendation(s): Valves suboptimally imaged to exclude  endocarditis. If clinical concern persists, transesophageal echocardiogram should be considered. FINDINGS  Left Ventricle: Left ventricular ejection fraction, by estimation, is 55 to 60%. The left ventricle has normal function. Left ventricular endocardial border not optimally defined to evaluate regional wall motion. The left ventricular internal cavity size was normal in size. There is no left ventricular hypertrophy. Left ventricular diastolic parameters are consistent with Grade I diastolic dysfunction (impaired relaxation). Right Ventricle: The right ventricular size is mildly enlarged. Mildly increased right ventricular wall thickness. Right ventricular systolic function is normal. There is normal pulmonary artery systolic pressure. The tricuspid regurgitant velocity is 1.72 m/s, and with an assumed right atrial pressure of 3 mmHg, the estimated right ventricular systolic pressure is 60.1 mmHg. Left Atrium: Left atrial size was normal in size. Right Atrium: Right atrial size was normal in size. Pericardium: There is no evidence of pericardial effusion. Mitral Valve: The mitral valve is degenerative in appearance. There is mild thickening of the mitral valve leaflet(s). There is mild calcification of the mitral valve leaflet(s). Mild mitral annular calcification. Trivial mitral valve regurgitation. No evidence of mitral valve stenosis. Tricuspid Valve: The tricuspid valve is grossly normal. Tricuspid valve regurgitation is trivial. Aortic Valve: The aortic valve was not well visualized. Aortic valve regurgitation is not visualized. No aortic stenosis is present. Aortic valve mean gradient measures 3.0 mmHg. Aortic valve peak gradient measures 4.8 mmHg. Aortic valve area, by VTI measures 2.04 cm. Pulmonic Valve: The pulmonic valve was not well visualized. Pulmonic valve regurgitation not well assessed. Aorta: The aortic root is normal in size and structure. Pulmonary Artery: The pulmonary artery is not well  seen. Venous: The inferior vena cava is normal in size with greater  than 50% respiratory variability, suggesting right atrial pressure of 3 mmHg. IAS/Shunts: The interatrial septum was not well visualized.  LEFT VENTRICLE PLAX 2D LVIDd:         5.38 cm  Diastology LVIDs:         2.94 cm  LV e' medial:    6.09 cm/s LV PW:         1.02 cm  LV E/e' medial:  10.3 LV IVS:        0.73 cm  LV e' lateral:   11.40 cm/s LVOT diam:     2.10 cm  LV E/e' lateral: 5.5 LV SV:         45 LV SV Index:   27 LVOT Area:     3.46 cm  RIGHT VENTRICLE RV Basal diam:  4.23 cm RV S prime:     9.46 cm/s TAPSE (M-mode): 2.0 cm LEFT ATRIUM             Index       RIGHT ATRIUM           Index LA diam:        3.50 cm 2.08 cm/m  RA Area:     16.50 cm LA Vol (A2C):   30.0 ml 17.81 ml/m RA Volume:   44.10 ml  26.18 ml/m LA Vol (A4C):   39.3 ml 23.33 ml/m LA Biplane Vol: 35.6 ml 21.13 ml/m  AORTIC VALVE                   PULMONIC VALVE AV Area (Vmax):    1.89 cm    PV Vmax:        0.88 m/s AV Area (Vmean):   2.10 cm    PV Peak grad:   3.1 mmHg AV Area (VTI):     2.04 cm    RVOT Peak grad: 3 mmHg AV Vmax:           110.00 cm/s AV Vmean:          74.000 cm/s AV VTI:            0.219 m AV Peak Grad:      4.8 mmHg AV Mean Grad:      3.0 mmHg LVOT Vmax:         60.10 cm/s LVOT Vmean:        44.800 cm/s LVOT VTI:          0.129 m LVOT/AV VTI ratio: 0.59  AORTA Ao Root diam: 3.60 cm MITRAL VALVE               TRICUSPID VALVE MV Area (PHT): 4.86 cm    TR Peak grad:   11.8 mmHg MV Decel Time: 156 msec    TR Vmax:        172.00 cm/s MV E velocity: 62.60 cm/s MV A velocity: 86.50 cm/s  SHUNTS MV E/A ratio:  0.72        Systemic VTI:  0.13 m                            Systemic Diam: 2.10 cm Nelva Bush MD Electronically signed by Nelva Bush MD Signature Date/Time: 08/28/2020/9:56:00 AM    Final       ASSESSMENT/PLAN   Acute hypoxemic respiratory failure - present on admission -patient initially required 7L/min supplemental O2 -  COVID19 negative  - supplemental O2 during my evaluation 2L/min  - will perform  infectious workup for pneumonia -serum fungitell -legionella ab -strep pneumoniae ur AG -Histoplasma Ur Ag -sputum resp cultures -AFB sputum expectorated specimen -sputum cytology  -reviewed pertinent imaging with patient today -please encourage patient to use incentive spirometer few times each hour while hospitalized.     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    Patient/Family are satisfied with care plan and all questions have been answered.  This document was prepared using Dragon voice recognition software and may include unintentional dictation errors.     Ottie Glazier, M.D.  Division of [facility]

## 2020-09-19 NOTE — Progress Notes (Signed)
Eeg done 

## 2020-09-19 NOTE — Progress Notes (Signed)
Initial Nutrition Assessment  DOCUMENTATION CODES:  Severe malnutrition in context of acute illness/injury, Non-severe (moderate) malnutrition in context of chronic illness, Underweight  INTERVENTION:  Obtain updated weight.  Add Ensure Enlive po TID, each supplement provides 350 kcal and 20 grams of protein.  Add Magic cup TID with meals, each supplement provides 290 kcal and 9 grams of protein.  Add MVI with minerals daily.  NUTRITION DIAGNOSIS:  Severe Malnutrition related to acute illness, chronic illness as evidenced by percent weight loss, moderate fat depletion, severe fat depletion, severe muscle depletion.  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  Spoke with pt at bedside. Pt reports that he has been eating very well at home - eating whatever his mother prepares for him. Some of the foods include lasagna, chicken, steak, etc.   He reports losing some weight, approximately 10 lbs, but unsure of how long. Per Epic, pt has lost ~39 lbs (22.7%) in about 2.5 months, which is significant and severe for the time frame. Weight has not been updated for this admission. Recommend obtaining updated weight. RD to use 60.3 kg to estimate needs.  On exam, pt is severely depleted in almost all areas.  Given above information, pt is severely malnourished in the acute setting likely on moderate chronic malnutrition.  Recommend adding Ensure Enlive TID, Magic Cup TID, and MVI with minerals daily to promote increased caloric and protein intake. Continue regular diet as well to promote choices.  Medications: reviewed; Klor-Con  40 mEq, prednisone  Labs: reviewed; K 3.4 (L), Glucose 163 (H), serum Ca 7.8 (L)  NUTRITION - FOCUSED PHYSICAL EXAM: Flowsheet Row Most Recent Value  Orbital Region Severe depletion  Upper Arm Region Severe depletion  Thoracic and Lumbar Region Severe depletion  Buccal Region Moderate depletion  Temple Region Severe depletion  Clavicle Bone Region Severe depletion  Clavicle and Acromion Bone Region Severe depletion  Scapular Bone Region Unable to assess  Dorsal Hand Severe depletion  Patellar Region Severe depletion  Anterior Thigh Region Severe depletion  Posterior Calf Region Severe depletion  Edema (RD Assessment) None  Hair Reviewed  Eyes Reviewed  Mouth Reviewed  [Poor dentition]  Skin Reviewed  Nails Reviewed   Diet Order:   Diet Order             DIET DYS 3 Room service appropriate? Yes with Assist; Fluid consistency: Thin  Diet effective now                  EDUCATION NEEDS:  Education needs have been addressed  Skin:  Skin Assessment: Reviewed RN Assessment  Last BM:  09/17/20  Height:  Ht Readings from Last 1 Encounters:  09/06/20 5\' 6"  (1.676 m)   Weight:  Wt Readings from Last 1 Encounters:  09/06/20 60.3 kg   Ideal Body Weight:  64.5 kg  BMI:  There is no height or weight on file to calculate BMI.  Estimated Nutritional Needs:  Kcal:  2100-2300 Protein:  90-105 grams Fluid:  >2 L  Derrel Nip, RD, LDN Registered Dietitian I After-Hours/Weekend Pager # in [place]

## 2020-09-19 NOTE — Progress Notes (Signed)
Date of Admission:  09/17/2020   Total days of antibiotics  Linezolid 09/18/20           ID: Marcus [name] is a 60 y.o. male  Principal Problem:   Acute on chronic respiratory failure with hypoxia (HCC) Active Problems:   Seropositive rheumatoid arthritis (HCC)   Chronic obstructive pulmonary disease (HCC)   Sepsis due to undetermined organism (HCC)   Hypokalemia   Chronic diastolic CHF (congestive heart failure) (HCC)   Cavitary pneumonia   Acquired neutropenia (HCC)   Hemoptysis   Lung abscess (HCC)    Subjective: Doing better Cough better No hemoptysis  Medications:   feeding supplement  237 mL Oral TID BM   heparin  5,000 Units Subcutaneous Q8H   multivitamin with minerals  1 tablet Oral Daily   nicotine  21 mg Transdermal Daily   predniSONE  10 mg Oral BID WC    Objective: Vital signs in last 24 hours: Temp:  [97.2 F (36.2 C)-97.4 F (36.3 C)] 97.2 F (36.2 C) (06/15 0500) Pulse Rate:  [70-83] 72 (06/15 0938) Resp:  [18-27] 18 (06/15 0938) BP: (92-104)/(58-65) 104/63 (06/15 0938) SpO2:  [93 %-99 %] 93 % (06/15 0938) Weight:  [63.7 kg] 63.7 kg (06/15 1000)  PHYSICAL EXAM:  General: Alert, cooperative, chronically ill , Head: Normocephalic, without obvious abnormality, atraumatic. Eyes: Conjunctivae clear, anicteric sclerae. Pupils are equal ENT Nares normal. No drainage or sinus tenderness. Poor dentition Neck: Supple, symmetrical, no adenopathy, thyroid: non tender no carotid bruit and no JVD. Back: No CVA tenderness. Lungs: b/l air entry- crepts rt side Heart: s1s2 Abdomen: Soft, non-tender,not distended. Bowel sounds normal. No masses Extremities: atraumatic, no cyanosis. No edema. No clubbing Skin: No rashes or lesions. Or bruising Lymph: Cervical, supraclavicular normal. Neurologic: Grossly non-focal  Lab Results Recent Labs    09/18/20 0533 09/19/20 0506  WBC 4.9 3.7*  HGB 9.8* 9.8*  HCT 29.7* 29.0*  NA 131* 136  K 3.6 3.4*  CL 104  103  CO2 26 24  BUN 20 14  CREATININE 0.48* 0.48*   Liver Panel Recent Labs    09/17/20 1345 09/18/20 0533  PROT 6.7 5.0*  ALBUMIN 2.4* 1.8*  AST 21 15  ALT 12 9  ALKPHOS 276* 168*  BILITOT 1.0 0.8   Sedimentation Rate No results for input(s): ESRSEDRATE in the last 72 hours. C-Reactive Protein No results for input(s): CRP in the last 72 hours.  Microbiology: 09/17/20 BC NG 09/23/20 Sputum culture MRSA  Studies/Results: CT Angio Chest PE W and/or Wo Contrast  Result Date: 09/17/2020 CLINICAL DATA:  Hemoptysis, shortness of breath, abdominal distension EXAM: CT ANGIOGRAPHY CHEST CT ABDOMEN AND PELVIS WITH CONTRAST TECHNIQUE: Multidetector CT imaging of the chest was performed using the standard protocol during bolus administration of intravenous contrast. Multiplanar CT image reconstructions and MIPs were obtained to evaluate the vascular anatomy. Multidetector CT imaging of the abdomen and pelvis was performed using the standard protocol during bolus administration of intravenous contrast. CONTRAST:  47mL OMNIPAQUE IOHEXOL 350 MG/ML SOLN COMPARISON:  CT 06/20/2020, 06/22/2020 FINDINGS: CTA CHEST FINDINGS Cardiovascular: Satisfactory opacification of the pulmonary arteries to the segmental level. No evidence of pulmonary embolism. Thoracic aorta is normal in course and caliber. Atherosclerotic calcifications of the aorta and coronary arteries. Normal heart size. No pericardial effusion. Mediastinum/Nodes: Mildly prominent mediastinal lymph nodes are again noted, likely reactive. No axillary or hilar lymphadenopathy. Lungs/Pleura: Redemonstrated cavitary mass located within the inferomedial aspect of the right upper lobe measuring  approximately 9.9 x 6.6 x 7.4 cm (previously measured approximately 7.8 x 4.5 x 5.5 cm on 08/22/2020. Increased surrounding consolidation and ground-glass opacity within the adjacent right upper lobe and right middle lobe previously seen cavitary lesion within the  medial aspect of the left upper lobe has resolved with small thin walled pleural cyst remaining (series 6, image 44). Scattered areas of ground-glass opacity within the bilateral lower lobes and upper lobes. Background of moderate emphysema. No pleural effusion or pneumothorax. Musculoskeletal: Remote right sixth and seventh rib fractures. No new or acute osseous findings. No chest wall abnormality. Review of the MIP images confirms the above findings. CT ABDOMEN and PELVIS FINDINGS Hepatobiliary: Cholelithiasis without evidence of pericholecystic inflammation by CT. Liver within normal limits. No biliary dilatation. Pancreas: Unremarkable. No pancreatic ductal dilatation or surrounding inflammatory changes. Spleen: Prior splenectomy. Adrenals/Urinary Tract: Unremarkable adrenal glands. Kidneys enhance symmetrically without focal lesion, stone, or hydronephrosis. Ureters are nondilated. Urinary bladder appears unremarkable. Stomach/Bowel: Stomach is within normal limits. Appendix appears normal (series 2, image 58). Incidental note of a small bowel-small bowel intussusception within the mid left abdomen (series 2, image 38). No evidence of bowel wall thickening, distention, or inflammatory changes. Vascular/Lymphatic: Fusiform infrarenal abdominal aortic aneurysm measuring up to 3.1 cm, unchanged. Aortoiliac atherosclerosis. No abdominopelvic lymphadenopathy. Reproductive: Prostate is unremarkable. Other: No free fluid. No abdominopelvic fluid collection. No pneumoperitoneum. Fat containing midline supraumbilical hernia without evidence of complication. Musculoskeletal: No acute or significant osseous findings. Review of the MIP images confirms the above findings. IMPRESSION: 1. No evidence of acute pulmonary embolism. 2. Enlarging cavitary mass within the inferomedial aspect of the right upper lobe measuring up to 9.9 cm (previously measured approximately 7.8 x 4.5 x 5.5 cm on 08/22/2020). Increased surrounding  consolidation and ground-glass opacity within the adjacent right upper lobe and right middle lobe. Findings remain most compatible with cavitary/necrotizing pneumonia. 3. Scattered areas of ground-glass opacity within the bilateral lower lobes and upper lobes, concerning for multifocal infection. 4. Interval resolution of previously seen cavitary lesion within the medial aspect of the left upper lobe with small thin walled pleural cyst remaining. 5. Cholelithiasis without evidence of pericholecystic inflammation by CT. 6. Incidental note of a small bowel-small bowel intussusception within the mid left abdomen. No evidence of bowel obstruction. 7. Fat containing midline supraumbilical hernia without evidence of complication. 8. Stable infrarenal abdominal aortic aneurysm measuring up to 3.1 cm. Recommend follow-up ultrasound every 3 years. This recommendation follows ACR consensus guidelines: White Paper of the ACR Incidental Findings Committee II on Vascular Findings. J Am Coll Radiol 2013; 10:789-794. Aortic Atherosclerosis (ICD10-I70.0) and Emphysema (ICD10-J43.9). Electronically Signed   By: Davina Poke D.O.   On: 09/17/2020 16:38   MR BRAIN W WO CONTRAST  Result Date: 09/18/2020 CLINICAL DATA:  Brain mass or lesion. 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 Recent MRSA pneumonia. Patient reports visual symptoms and seizure-like activity a few weeks ago. EXAM: MRI HEAD WITHOUT AND WITH CONTRAST TECHNIQUE: Multiplanar, multiecho pulse sequences of the brain and surrounding structures were obtained without and with intravenous contrast. CONTRAST:  22mL GADAVIST GADOBUTROL 1 MMOL/ML IV SOLN COMPARISON:  No pertinent prior exams available for comparison. FINDINGS: Brain: Intermittently motion degraded examination, limiting evaluation. Most notably, there is moderate/severe motion degradation of the sagittal T1 weighted sequence,  moderate/severe motion degradation of the axial T2/FLAIR sequence, moderate motion degradation of the axial T1 weighted postcontrast sequence and moderate motion degradation of the coronal T1  weighted postcontrast sequence. Mild generalized cerebral and cerebellar atrophy. Advanced patchy and confluent T2/FLAIR hyperintensity within the cerebral white matter, nonspecific but most often secondary to chronic small vessel ischemia. Chronic lacunar infarct within the left lentiform nucleus. Small foci of SWI signal loss within the left basal ganglia and along the margin of the posterior right lateral ventricle, likely reflecting chronic microhemorrhages. Additional subcentimeter focus of SWI signal loss within the midline cerebellum, which may reflect an additional chronic microhemorrhage or cavernoma. There is no acute infarct. No evidence of intracranial mass. No extra-axial fluid collection. No midline shift. Within the limitations of motion degradation, no abnormal intracranial enhancement is identified. Vascular: Expected proximal arterial flow voids. Non dominant intracranial right vertebral artery. Skull and upper cervical spine: Within the limitations of motion degradation, no focal marrow lesion is identified. Sinuses/Orbits: Visualized orbits show no acute finding. 1.9 cm right maxillary sinus mucous retention cyst. Trace left maxillary sinus mucosal thickening. Other: Trace bilateral mastoid effusions. 2.8 cm ovoid lesion within the left suboccipital scalp demonstrating heterogeneous but predominantly T2 hyperintense signal, T1 hypointense signal, restricted diffusion and no convincing abnormal enhancement. This is favored to reflect a sebaceous/epidermoid cyst. IMPRESSION: Motion degraded examination, as described and limiting evaluation. No evidence of acute intracranial abnormality. Severe patchy and confluent T2/FLAIR hyperintensity within the cerebral white matter, nonspecific but most often secondary to  chronic small vessel ischemia. Chronic left basal ganglia lacunar infarct. Subcentimeter focus of SWI signal loss within the midline cerebellum, which may reflect a chronic microhemorrhage or cavernoma. Mild generalized parenchymal atrophy. Incidentally noted 1.9 cm right maxillary sinus mucous retention cyst. Trace bilateral mastoid effusions. Electronically Signed   By: Kellie Simmering DO   On: 09/18/2020 15:59   CT ABDOMEN PELVIS W CONTRAST  Result Date: 09/17/2020 CLINICAL DATA:  Hemoptysis, shortness of breath, abdominal distension EXAM: CT ANGIOGRAPHY CHEST CT ABDOMEN AND PELVIS WITH CONTRAST TECHNIQUE: Multidetector CT imaging of the chest was performed using the standard protocol during bolus administration of intravenous contrast. Multiplanar CT image reconstructions and MIPs were obtained to evaluate the vascular anatomy. Multidetector CT imaging of the abdomen and pelvis was performed using the standard protocol during bolus administration of intravenous contrast. CONTRAST:  38mL OMNIPAQUE IOHEXOL 350 MG/ML SOLN COMPARISON:  CT 06/20/2020, 06/22/2020 FINDINGS: CTA CHEST FINDINGS Cardiovascular: Satisfactory opacification of the pulmonary arteries to the segmental level. No evidence of pulmonary embolism. Thoracic aorta is normal in course and caliber. Atherosclerotic calcifications of the aorta and coronary arteries. Normal heart size. No pericardial effusion. Mediastinum/Nodes: Mildly prominent mediastinal lymph nodes are again noted, likely reactive. No axillary or hilar lymphadenopathy. Lungs/Pleura: Redemonstrated cavitary mass located within the inferomedial aspect of the right upper lobe measuring approximately 9.9 x 6.6 x 7.4 cm (previously measured approximately 7.8 x 4.5 x 5.5 cm on 08/22/2020. Increased surrounding consolidation and ground-glass opacity within the adjacent right upper lobe and right middle lobe previously seen cavitary lesion within the medial aspect of the left upper lobe has  resolved with small thin walled pleural cyst remaining (series 6, image 44). Scattered areas of ground-glass opacity within the bilateral lower lobes and upper lobes. Background of moderate emphysema. No pleural effusion or pneumothorax. Musculoskeletal: Remote right sixth and seventh rib fractures. No new or acute osseous findings. No chest wall abnormality. Review of the MIP images confirms the above findings. CT ABDOMEN and PELVIS FINDINGS Hepatobiliary: Cholelithiasis without evidence of pericholecystic inflammation by CT. Liver within normal limits. No biliary dilatation. Pancreas: Unremarkable. No pancreatic ductal dilatation or  surrounding inflammatory changes. Spleen: Prior splenectomy. Adrenals/Urinary Tract: Unremarkable adrenal glands. Kidneys enhance symmetrically without focal lesion, stone, or hydronephrosis. Ureters are nondilated. Urinary bladder appears unremarkable. Stomach/Bowel: Stomach is within normal limits. Appendix appears normal (series 2, image 58). Incidental note of a small bowel-small bowel intussusception within the mid left abdomen (series 2, image 38). No evidence of bowel wall thickening, distention, or inflammatory changes. Vascular/Lymphatic: Fusiform infrarenal abdominal aortic aneurysm measuring up to 3.1 cm, unchanged. Aortoiliac atherosclerosis. No abdominopelvic lymphadenopathy. Reproductive: Prostate is unremarkable. Other: No free fluid. No abdominopelvic fluid collection. No pneumoperitoneum. Fat containing midline supraumbilical hernia without evidence of complication. Musculoskeletal: No acute or significant osseous findings. Review of the MIP images confirms the above findings. IMPRESSION: 1. No evidence of acute pulmonary embolism. 2. Enlarging cavitary mass within the inferomedial aspect of the right upper lobe measuring up to 9.9 cm (previously measured approximately 7.8 x 4.5 x 5.5 cm on 08/22/2020). Increased surrounding consolidation and ground-glass opacity  within the adjacent right upper lobe and right middle lobe. Findings remain most compatible with cavitary/necrotizing pneumonia. 3. Scattered areas of ground-glass opacity within the bilateral lower lobes and upper lobes, concerning for multifocal infection. 4. Interval resolution of previously seen cavitary lesion within the medial aspect of the left upper lobe with small thin walled pleural cyst remaining. 5. Cholelithiasis without evidence of pericholecystic inflammation by CT. 6. Incidental note of a small bowel-small bowel intussusception within the mid left abdomen. No evidence of bowel obstruction. 7. Fat containing midline supraumbilical hernia without evidence of complication. 8. Stable infrarenal abdominal aortic aneurysm measuring up to 3.1 cm. Recommend follow-up ultrasound every 3 years. This recommendation follows ACR consensus guidelines: White Paper of the ACR Incidental Findings Committee II on Vascular Findings. J Am Coll Radiol 2013; 10:789-794. Aortic Atherosclerosis (ICD10-I70.0) and Emphysema (ICD10-J43.9). Electronically Signed   By: Davina Poke D.O.   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 There is increase in consolidation in this area compared to most recent study. Lungs elsewhere clear. Heart size and pulmonary vascular normal. No adenopathy appreciable. No bone lesions. IMPRESSION: Cavitary mass right middle lobe anteriorly with increase in surrounding consolidation compared to most recent study. The lungs elsewhere are clear. Heart size normal. No adenopathy appreciable by radiography. Electronically Signed   By: Lowella Grip III M.D.   On: 09/17/2020 13:54     Assessment/Plan:  Severe lung  abscess following necrotizing pneumonia MRSA lung abscess rt upper lobe and left side Woud recommend CT surgery opinion as he failed therapy with doxy and it is bigger an with underlying immunecompromised state/COPD may not be totally amenable to antibiotic- Currently on linezolid which is ana excellent drug for MRSA with good lung penetration  Rheumatoid arthritis with Felty syndrome  Pancytopenia Had neutropenia and was seen by heme-onc and started on Granix.Patient is on linezolid we will keep a close eye.  History of splenectomy   COPD oxygen dependent Current smoker  Discussed the management  with patient and his brother

## 2020-09-20 ENCOUNTER — Inpatient Hospital Stay: Payer: Medicaid Other | Admitting: Infectious Diseases

## 2020-09-20 DIAGNOSIS — J9621 Acute and chronic respiratory failure with hypoxia: Secondary | ICD-10-CM | POA: Diagnosis not present

## 2020-09-20 DIAGNOSIS — R4182 Altered mental status, unspecified: Secondary | ICD-10-CM | POA: Diagnosis not present

## 2020-09-20 DIAGNOSIS — J850 Gangrene and necrosis of lung: Secondary | ICD-10-CM | POA: Diagnosis not present

## 2020-09-20 LAB — CBC WITH DIFFERENTIAL/PLATELET
Abs Immature Granulocytes: 0.08 10*3/uL — ABNORMAL HIGH (ref 0.00–0.07)
Basophils Absolute: 0 10*3/uL (ref 0.0–0.1)
Basophils Relative: 0 %
Eosinophils Absolute: 0 10*3/uL (ref 0.0–0.5)
Eosinophils Relative: 1 %
HCT: 31.3 % — ABNORMAL LOW (ref 39.0–52.0)
Hemoglobin: 10 g/dL — ABNORMAL LOW (ref 13.0–17.0)
Immature Granulocytes: 5 %
Lymphocytes Relative: 30 %
Lymphs Abs: 0.5 10*3/uL — ABNORMAL LOW (ref 0.7–4.0)
MCH: 27.1 pg (ref 26.0–34.0)
MCHC: 31.9 g/dL (ref 30.0–36.0)
MCV: 84.8 fL (ref 80.0–100.0)
Monocytes Absolute: 0.5 10*3/uL (ref 0.1–1.0)
Monocytes Relative: 34 %
Neutro Abs: 0.5 10*3/uL — ABNORMAL LOW (ref 1.7–7.7)
Neutrophils Relative %: 30 %
Platelets: 262 10*3/uL (ref 150–400)
RBC: 3.69 MIL/uL — ABNORMAL LOW (ref 4.22–5.81)
RDW: 18.7 % — ABNORMAL HIGH (ref 11.5–15.5)
WBC: 1.5 10*3/uL — ABNORMAL LOW (ref 4.0–10.5)
nRBC: 1.3 % — ABNORMAL HIGH (ref 0.0–0.2)

## 2020-09-20 LAB — BASIC METABOLIC PANEL
Anion gap: 6 (ref 5–15)
BUN: 14 mg/dL (ref 6–20)
CO2: 31 mmol/L (ref 22–32)
Calcium: 8.2 mg/dL — ABNORMAL LOW (ref 8.9–10.3)
Chloride: 101 mmol/L (ref 98–111)
Creatinine, Ser: 0.34 mg/dL — ABNORMAL LOW (ref 0.61–1.24)
GFR, Estimated: >60 mL/min (ref >60)
Glucose, Bld: 132 mg/dL — ABNORMAL HIGH (ref 70–99)
Potassium: 3.9 mmol/L (ref 3.5–5.1)
Sodium: 138 mmol/L (ref 135–145)

## 2020-09-20 LAB — LEGIONELLA PNEUMOPHILA TOTAL AB: Legionella Pneumo Total Ab: <0.91 OD ratio (ref 0.00–0.90)

## 2020-09-20 LAB — MAGNESIUM: Magnesium: 2 mg/dL (ref 1.7–2.4)

## 2020-09-20 LAB — EXPECTORATED SPUTUM ASSESSMENT W GRAM STAIN, RFLX TO RESP C: Special Requests: NORMAL

## 2020-09-20 MED ORDER — LINEZOLID 600 MG PO TABS
600.0000 mg | ORAL_TABLET | Freq: Two times a day (BID) | ORAL | Status: DC
  Administered 2020-09-21 – 2020-09-28 (×15): 600 mg via ORAL
  Filled 2020-09-20 (×17): qty 1

## 2020-09-20 MED ORDER — TBO-FILGRASTIM 480 MCG/0.8ML ~~LOC~~ SOSY
480.0000 ug | PREFILLED_SYRINGE | Freq: Every day | SUBCUTANEOUS | Status: DC
Start: 2020-09-20 — End: 2020-09-22
  Administered 2020-09-20 – 2020-09-21 (×2): 480 ug via SUBCUTANEOUS
  Filled 2020-09-20 (×3): qty 0.8

## 2020-09-20 NOTE — Plan of Care (Signed)
  Problem: Health Behavior/Discharge Planning: Goal: Ability to manage health-related needs will improve Outcome: Progressing   Problem: Clinical Measurements: Goal: Respiratory complications will improve Outcome: Progressing   Problem: Activity: Goal: Risk for activity intolerance will decrease Outcome: Progressing   Problem: Pain Managment: Goal: General experience of comfort will improve Outcome: Progressing   Problem: Safety: Goal: Ability to remain free from injury will improve Outcome: Progressing   

## 2020-09-20 NOTE — Progress Notes (Signed)
Date of Admission:  09/17/2020    ID: Marcus [name] is a 60 y.o. male  Principal Problem:   Acute on chronic respiratory failure with hypoxia (HCC) Active Problems:   Seropositive rheumatoid arthritis (HCC)   Chronic obstructive pulmonary disease (HCC)   Sepsis due to undetermined organism (HCC)   Hypokalemia   Chronic diastolic CHF (congestive heart failure) (HCC)   Cavitary pneumonia   Acquired neutropenia (HCC)   Hemoptysis   Lung abscess (HCC)   Protein-calorie malnutrition, severe    Subjective: Doing better Coughing less  Medications:   Chlorhexidine Gluconate Cloth  6 each Topical Q0600   feeding supplement  237 mL Oral TID BM   guaiFENesin  600 mg Oral BID   [START ON 09/21/2020] linezolid  600 mg Oral Q12H   multivitamin with minerals  1 tablet Oral Daily   mupirocin ointment   Nasal BID   nicotine  21 mg Transdermal Daily   predniSONE  10 mg Oral BID WC   Tbo-filgastrim (GRANIX) SQ  480 mcg Subcutaneous Daily    Objective: Vital signs in last 24 hours: Temp:  [97.4 F (36.3 C)-98.2 F (36.8 C)] 97.5 F (36.4 C) (06/16 1636) Pulse Rate:  [70-85] 79 (06/16 1636) Resp:  [16-22] 22 (06/16 1636) BP: (119-143)/(74-90) 132/79 (06/16 1636) SpO2:  [92 %-97 %] 97 % (06/16 1636)  PHYSICAL EXAM:  General: Alert, cooperative, no distress, appears stated age.  Lungs: b/l air entry- crepts rt side Heart: irregular Abdomen: lap scar Extremities: atraumatic, no cyanosis. No edema. No clubbing Skin: No rashes or lesions. Or bruising Lymph: Cervical, supraclavicular normal. Neurologic: Grossly non-focal  Lab Results Recent Labs    09/19/20 0506 09/20/20 0443  WBC 3.7* 1.5*  HGB 9.8* 10.0*  HCT 29.0* 31.3*  NA 136 138  K 3.4* 3.9  CL 103 101  CO2 24 31  BUN 14 14  CREATININE 0.48* 0.34*   Liver Panel Recent Labs    09/18/20 0533  PROT 5.0*  ALBUMIN 1.8*  AST 15  ALT 9  ALKPHOS 168*  BILITOT 0.8   Sedimentation Rate No results for input(s):  ESRSEDRATE in the last 72 hours. C-Reactive Protein No results for input(s): CRP in the last 72 hours.  Microbiology:  Studies/Results: MR CERVICAL SPINE W WO CONTRAST  Result Date: 09/19/2020 CLINICAL DATA:  Acute or progressive myelopathy. EXAM: MRI CERVICAL SPINE WITHOUT AND WITH CONTRAST TECHNIQUE: Multiplanar and multiecho pulse sequences of the cervical spine, to include the craniocervical junction and cervicothoracic junction, were obtained without and with intravenous contrast. CONTRAST:  24mL GADAVIST GADOBUTROL 1 MMOL/ML IV SOLN COMPARISON:  No previous cervical imaging. FINDINGS: Alignment: Straightening of the normal cervical lordosis. 1 mm degenerative anterolisthesis C7-T1. Vertebrae: No fracture or focal bone lesion. Cord: No primary cord lesion.  See below regarding stenosis. Posterior Fossa, vertebral arteries, paraspinal tissues: Large sebaceous cyst of the upper left neck. Disc levels: Foramen magnum is widely patent.  C1-2 and C2-3 are normal. C3-4: Endplate osteophytes and bulging of the disc. Canal stenosis with AP diameter in the midline 6 mm. Effacement of the subarachnoid space and slight indentation of the cord. Moderate bilateral foraminal narrowing. C4-5: Endplate osteophytes and protrusion of the disc more prominent towards the left. Effacement of the subarachnoid space and cord deformity, worse on the left. AP diameter of the canal 5.9 mm. Bilateral foraminal stenosis. C5-6: Spondylosis with endplate osteophytes and bulging of the disc. Canal narrowing with AP diameter in the midline 8.3 mm. No  compressive effect upon the cord. Bilateral foraminal narrowing. C6-7: Mild bulging of the disc. No canal stenosis. Foraminal narrowing on the right that could affect the exiting C7 nerve. C7-T1: Facet osteoarthritis with 1 mm of anterolisthesis. No stenosis of the canal or foramina. 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 AP diameter of the canal at  C3-4 6 mm. AP diameter of the spinal canal at C4-5 5.9 mm. Effacement of the subarachnoid space with some indentation of the cord. No abnormal cord T2 signal however. Foraminal stenosis that could cause neural compression on either side at C3-4, either side at C4-5, either side at C5-6 and on the right C6-7. Electronically Signed   By: Nelson Chimes M.D.   On: 09/19/2020 18:01   MR THORACIC SPINE W WO CONTRAST  Result Date: 09/19/2020 CLINICAL DATA:  Acute or progressive myelopathy. EXAM: MRI THORACIC WITHOUT AND WITH CONTRAST TECHNIQUE: Multiplanar and multiecho pulse sequences of the thoracic spine were obtained without and with intravenous contrast. CONTRAST:  32mL GADAVIST GADOBUTROL 1 MMOL/ML IV SOLN COMPARISON:  Cervical study same day FINDINGS: Alignment:  Normal Vertebrae: No fracture or focal bone lesion. Cord: No cord compression or primary cord lesion. Spinal canal widely patent throughout the region. No evidence of arteriovenous malformation. Paraspinal and other soft tissues: Negative Disc levels: No significant disc pathology in the thoracic region. Wide patency of the canal and foramina. Ordinary mild facet osteoarthritis without edema or enhancement. No encroachment upon the neural spaces. IMPRESSION: Negative thoracic study. No cord compression or cord lesion. No significant degenerative changes. Electronically Signed   By: Nelson Chimes M.D.   On: 09/19/2020 18:03     Assessment/Plan: Severe lung abscess following necrotizing pneumonia MRSA lung abscess rt upper lobe and left side underlying immunecompromised state/COPD may not be totally amenable to antibiotic- Currently on linezolid which is ana excellent drug for MRSA with good lung penetration  Rheumatoid arthritis with Felty syndrome  Pancytopenia Had neutropenia and was seen by heme-onc and started on Granix.Patient is on linezolid we will keep a close eye.  History of splenectomy    COPD oxygen dependent Current  smoker  Discussed the management with the patient

## 2020-09-20 NOTE — Progress Notes (Signed)
Sputum culture collected and sent to lab at this time.

## 2020-09-20 NOTE — Progress Notes (Signed)
Pulmonary Medicine          Date: 09/20/2020,   MRN# [id number] Marcus [name] 02/27/1961      HISTORY OF PRESENT ILLNESS  60 yo M w/ hx of COPD, Feltys syndrome (enlarged spleen, low WBC count associated with RA), epigastric hernia and rheumatoid arthritis.  Patient had admission 01/2020 to [facility] with findings of pneumoperitoneum he was intubated during this time, had procedure done with graham patch incision hernia repair s/p exlap. He had MRSA pneumonia 08/2020, he has been on antibiotics with doxycycline on outpatient. He came in to ER with complaints of hemoptysis.  PCCM consultation for additional evaluation and management. Patient reports having visual symptoms and seizure like activity few weeks ago. NEURO w/u on going.    09/19/20- patient seems slightly improved, his microbiology is still in process. Currently on empiric therapy.  MRSA is +.  Hemoptysis has slowed down.  09/20/20- MRSA + pneumonia/abscess, now on linozolid, bronch to r/o involvement (aspergillosis.vs malignancy etc)t, radiography has failed prior appropriate therapy.    PAST MEDICAL HISTORY   Past Medical History:  Diagnosis Date  . COPD (chronic obstructive pulmonary disease) ([place])   . Felty syndrome ([place])   . Hernia, epigastric   . Pancytopenia ([place])   . Seropositive rheumatoid arthritis ([place])   . Tobacco use disorder      SURGICAL HISTORY   Past Surgical History:  Procedure Laterality Date  . INCISIONAL HERNIA REPAIR  01/17/2020   Procedure: HERNIA REPAIR INCISIONAL AND Silvestre Gunner;  Surgeon: Kinsinger, Arta Bruce, MD;  Location: [facility];  Service: General;;  . LAPAROTOMY N/A 01/17/2020   Procedure: EXPLORATORY LAPAROTOMY;  Surgeon: Kieth Brightly Arta Bruce, MD;  Location: [facility];  Service: General;  Laterality: N/A;     FAMILY HISTORY   Family History  Problem Relation Age of Onset  . CAD Brother   . COPD Brother      SOCIAL HISTORY   Social History   Tobacco Use  .  Smoking status: Every Day    Packs/day: 1.00    Pack years: 0.00    Types: Cigarettes  . Smokeless tobacco: Never  Substance Use Topics  . Alcohol use: No    Alcohol/week: 0.0 standard drinks  . Drug use: Yes    Types: Marijuana     MEDICATIONS    Home Medication:    Current Medication:  Current Facility-Administered Medications:  .  acetaminophen (TYLENOL) tablet 650 mg, 650 mg, Oral, Q6H PRN **OR** acetaminophen (TYLENOL) suppository 650 mg, 650 mg, Rectal, Q6H PRN, Para Skeans, MD .  benzonatate (TESSALON) capsule 200 mg, 200 mg, Oral, BID PRN, Athena Masse, MD, 200 mg at 09/19/20 2330 .  Chlorhexidine Gluconate Cloth 2 % PADS 6 each, 6 each, Topical, Q0600, Lorella Nimrod, MD, 6 each at 09/20/20 0903 .  chlorpheniramine-HYDROcodone (TUSSIONEX) 10-8 MG/5ML suspension 5 mL, 5 mL, Oral, Q12H PRN, Athena Masse, MD, 5 mL at 09/20/20 0246 .  feeding supplement (ENSURE ENLIVE / ENSURE PLUS) liquid 237 mL, 237 mL, Oral, TID BM, Lorella Nimrod, MD, 237 mL at 09/20/20 1615 .  guaiFENesin (MUCINEX) 12 hr tablet 600 mg, 600 mg, Oral, BID, Mansy, Jan A, MD, 600 mg at 09/20/20 0854 .  ipratropium-albuterol (DUONEB) 0.5-2.5 (3) MG/3ML nebulizer solution 3 mL, 3 mL, Nebulization, Q4H PRN, Athena Masse, MD, 3 mL at 09/18/20 2208 .  linezolid (ZYVOX) IVPB 600 mg, 600 mg, Intravenous, Q12H, Ravishankar, Jayashree, MD, Last Rate: 300 mL/hr at  09/20/20 0903, 600 mg at 09/20/20 0903 .  [START ON 09/21/2020] linezolid (ZYVOX) tablet 600 mg, 600 mg, Oral, Q12H, Ravishankar, Jayashree, MD .  multivitamin with minerals tablet 1 tablet, 1 tablet, Oral, Daily, Lorella Nimrod, MD, 1 tablet at 09/20/20 0854 .  mupirocin ointment (BACTROBAN) 2 %, , Nasal, BID, Lorella Nimrod, MD, Given at 09/20/20 0854 .  nicotine (NICODERM CQ - dosed in mg/24 hours) patch 21 mg, 21 mg, Transdermal, Daily, Florina Ou V, MD, 21 mg at 09/20/20 0854 .  predniSONE (DELTASONE) tablet 10 mg, 10 mg, Oral, BID WC, Para Skeans, MD, 10 mg at 09/20/20 1615 .  Tbo-Filgrastim (GRANIX) injection 480 mcg, 480 mcg, Subcutaneous, Daily, Rogue Bussing, Govinda R, MD, 480 mcg at 09/20/20 1125    ALLERGIES   Levaquin [levofloxacin in d5w] and Methotrexate     REVIEW OF SYSTEMS    Review of Systems:  Gen:  Denies  fever, sweats, chills weigh loss  HEENT: Denies blurred vision, double vision, ear pain, eye pain, hearing loss, nose bleeds, sore throat Cardiac:  No dizziness, chest pain or heaviness, chest tightness,edema Resp:   Denies cough or sputum porduction, + shortness of breath,wheezing, + hemoptysis,  Gi: Denies swallowing difficulty, stomach pain, nausea or vomiting, diarrhea, constipation, bowel incontinence Gu:  Denies bladder incontinence, burning urine Ext:   Denies Joint pain, stiffness or swelling Skin: Denies  skin rash, easy bruising or bleeding or hives Endoc:  Denies polyuria, polydipsia , polyphagia or weight change Psych:   Denies depression, insomnia or hallucinations   Other:  All other systems negative   VS: BP 132/79 (BP Location: Right Arm)   Pulse 79   Temp (!) 97.5 F (36.4 C) (Oral)   Resp (!) 22   Wt 63.7 kg   SpO2 97%   BMI 22.68 kg/m      PHYSICAL EXAM    GENERAL:cachetic, soft spoken, small fram HEAD: Normocephalic, atraumatic. Temporal wasting EYES: Pupils equal, round, reactive to light. Extraocular muscles intact. No scleral icterus.  MOUTH: Moist mucosal membrane. Dentition intact. No abscess noted.  EAR, NOSE, THROAT: Clear without exudates. No external lesions.  NECK: Supple. No thyromegaly. No nodules. No JVD.  PULMONARY: coarse breath sounds in the RUL CARDIOVASCULAR: S1 and S2. Regular rate and rhythm. No murmurs, rubs, or gallops. No edema. Pedal pulses 2+ bilaterally.  GASTROINTESTINAL: Soft, nontender, nondistended. No masses. Positive bowel sounds. No hepatosplenomegaly.  MUSCULOSKELETAL: No swelling, clubbing, or edema. Range of motion full in all  extremities.  NEUROLOGIC: Cranial nerves II through XII are intact. No gross focal neurological deficits. Sensation intact. Reflexes intact.  SKIN: No ulceration, lesions, rashes, or cyanosis. Skin warm and dry. Turgor intact.  PSYCHIATRIC: Mood, affect within normal limits. The patient is awake, alert and oriented x 3. Insight, judgment intact.       IMAGING    DG Chest 2 View  Result Date: 09/06/2020 CLINICAL DATA:  Shortness of breath.  Cough. EXAM: CHEST - 2 VIEW COMPARISON:  Chest x-ray 08/28/2020.  CT 08/22/2020. FINDINGS: Mediastinum unremarkable. Heart size normal. Cavitary process in the right mid lung again noted. No change identified. No pleural effusion or pneumothorax. Old right rib fractures. IMPRESSION: Cavitary process in the right mid lung again noted. Cavitary process best identified by prior CT. Chest is unchanged from prior exam. Electronically Signed   By: Marcello Moores  Register   On: 09/06/2020 14:04   CT Chest Wo Contrast  Result Date: 08/22/2020 CLINICAL DATA:  Abnormal x-ray. Interstitial infiltrates with  possible right perihilar cavitation. Cough and shortness of breath. EXAM: CT CHEST WITHOUT CONTRAST TECHNIQUE: Multidetector CT imaging of the chest was performed following the standard protocol without IV contrast. COMPARISON:  Radiographs 08/22/2020 and 08/04/2020.  CT 06/10/2020. FINDINGS: Cardiovascular: Extensive three-vessel coronary artery atherosclerosis with lesser involvement of the aorta and great vessels. The heart size is normal. There is no pericardial effusion. Mediastinum/Nodes: Multiple small mediastinal and hilar lymph nodes have enlarged compared with the chest CT of 2 months ago, likely reactive. No axillary lymphadenopathy. The thyroid gland, trachea and esophagus demonstrate no significant findings. Lungs/Pleura: There is no pleural effusion or pneumothorax. Moderate centrilobular and paraseptal emphysema. As seen on the earlier radiographs, there is a new  cavitary mass inferomedially in the right upper lobe, measuring up to 7.8 x 5.5 cm on image 79/4. This has irregular thick walls and associated air-fluid levels. There was no abnormality in this area on the relatively recent prior chest CT, and this is most consistent with cavitating pneumonia. There is an additional cavitary lesion with an air-fluid level medially in the left upper lobe, measuring up to 2.4 cm on image 65/4. There is mild patchy airspace disease surrounding these cavitary infiltrates. Upper abdomen: Small calcified gallstones. No evidence of biliary dilatation or adrenal mass. Postsurgical changes in the anterior abdominal wall consistent with previous hernia repair. Musculoskeletal/Chest wall: There is no chest wall mass or suspicious osseous finding. There are old fractures of the right 5th and 6th ribs posterolaterally which appear unchanged. IMPRESSION: 1. CT confirms the presence of a cavitary infiltrate in the right upper lobe and shows a smaller area of cavitation anteriorly in the left upper lobe. No abnormalities were present in these areas on CT performed 2 months ago to suggest cavitary neoplasm, and these findings are consistent with cavitating/necrotizing pneumonia. 2. Mildly enlarged mediastinal and hilar lymph nodes, likely reactive. 3. No associated pleural disease. 4. Cholelithiasis. 5. Underlying moderate Emphysema (ICD10-J43.9). 6. Coronary and Aortic Atherosclerosis (ICD10-I70.0). Electronically Signed   By: Richardean Sale M.D.   On: 08/22/2020 14:26   CT Angio Chest PE W and/or Wo Contrast  Result Date: 09/17/2020 CLINICAL DATA:  Hemoptysis, shortness of breath, abdominal distension EXAM: CT ANGIOGRAPHY CHEST CT ABDOMEN AND PELVIS WITH CONTRAST TECHNIQUE: Multidetector CT imaging of the chest was performed using the standard protocol during bolus administration of intravenous contrast. Multiplanar CT image reconstructions and MIPs were obtained to evaluate the vascular  anatomy. Multidetector CT imaging of the abdomen and pelvis was performed using the standard protocol during bolus administration of intravenous contrast. CONTRAST:  48mL OMNIPAQUE IOHEXOL 350 MG/ML SOLN COMPARISON:  CT 06/20/2020, 06/22/2020 FINDINGS: CTA CHEST FINDINGS Cardiovascular: Satisfactory opacification of the pulmonary arteries to the segmental level. No evidence of pulmonary embolism. Thoracic aorta is normal in course and caliber. Atherosclerotic calcifications of the aorta and coronary arteries. Normal heart size. No pericardial effusion. Mediastinum/Nodes: Mildly prominent mediastinal lymph nodes are again noted, likely reactive. No axillary or hilar lymphadenopathy. Lungs/Pleura: Redemonstrated cavitary mass located within the inferomedial aspect of the right upper lobe measuring approximately 9.9 x 6.6 x 7.4 cm (previously measured approximately 7.8 x 4.5 x 5.5 cm on 08/22/2020. Increased surrounding consolidation and ground-glass opacity within the adjacent right upper lobe and right middle lobe previously seen cavitary lesion within the medial aspect of the left upper lobe has resolved with small thin walled pleural cyst remaining (series 6, image 44). Scattered areas of ground-glass opacity within the bilateral lower lobes and upper lobes. Background of  moderate emphysema. No pleural effusion or pneumothorax. Musculoskeletal: Remote right sixth and seventh rib fractures. No new or acute osseous findings. No chest wall abnormality. Review of the MIP images confirms the above findings. CT ABDOMEN and PELVIS FINDINGS Hepatobiliary: Cholelithiasis without evidence of pericholecystic inflammation by CT. Liver within normal limits. No biliary dilatation. Pancreas: Unremarkable. No pancreatic ductal dilatation or surrounding inflammatory changes. Spleen: Prior splenectomy. Adrenals/Urinary Tract: Unremarkable adrenal glands. Kidneys enhance symmetrically without focal lesion, stone, or hydronephrosis.  Ureters are nondilated. Urinary bladder appears unremarkable. Stomach/Bowel: Stomach is within normal limits. Appendix appears normal (series 2, image 58). Incidental note of a small bowel-small bowel intussusception within the mid left abdomen (series 2, image 38). No evidence of bowel wall thickening, distention, or inflammatory changes. Vascular/Lymphatic: Fusiform infrarenal abdominal aortic aneurysm measuring up to 3.1 cm, unchanged. Aortoiliac atherosclerosis. No abdominopelvic lymphadenopathy. Reproductive: Prostate is unremarkable. Other: No free fluid. No abdominopelvic fluid collection. No pneumoperitoneum. Fat containing midline supraumbilical hernia without evidence of complication. Musculoskeletal: No acute or significant osseous findings. Review of the MIP images confirms the above findings. IMPRESSION: 1. No evidence of acute pulmonary embolism. 2. Enlarging cavitary mass within the inferomedial aspect of the right upper lobe measuring up to 9.9 cm (previously measured approximately 7.8 x 4.5 x 5.5 cm on 08/22/2020). Increased surrounding consolidation and ground-glass opacity within the adjacent right upper lobe and right middle lobe. Findings remain most compatible with cavitary/necrotizing pneumonia. 3. Scattered areas of ground-glass opacity within the bilateral lower lobes and upper lobes, concerning for multifocal infection. 4. Interval resolution of previously seen cavitary lesion within the medial aspect of the left upper lobe with small thin walled pleural cyst remaining. 5. Cholelithiasis without evidence of pericholecystic inflammation by CT. 6. Incidental note of a small bowel-small bowel intussusception within the mid left abdomen. No evidence of bowel obstruction. 7. Fat containing midline supraumbilical hernia without evidence of complication. 8. Stable infrarenal abdominal aortic aneurysm measuring up to 3.1 cm. Recommend follow-up ultrasound every 3 years. This recommendation follows  ACR consensus guidelines: White Paper of the ACR Incidental Findings Committee II on Vascular Findings. J Am Coll Radiol 2013; 10:789-794. Aortic Atherosclerosis (ICD10-I70.0) and Emphysema (ICD10-J43.9). Electronically Signed   By: Davina Poke D.O.   On: 09/17/2020 16:38   MR BRAIN W WO CONTRAST  Result Date: 09/18/2020 CLINICAL DATA:  Brain mass or lesion. 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 Recent MRSA pneumonia. Patient reports visual symptoms and seizure-like activity a few weeks ago. EXAM: MRI HEAD WITHOUT AND WITH CONTRAST TECHNIQUE: Multiplanar, multiecho pulse sequences of the brain and surrounding structures were obtained without and with intravenous contrast. CONTRAST:  105mL GADAVIST GADOBUTROL 1 MMOL/ML IV SOLN COMPARISON:  No pertinent prior exams available for comparison. FINDINGS: Brain: Intermittently motion degraded examination, limiting evaluation. Most notably, there is moderate/severe motion degradation of the sagittal T1 weighted sequence, moderate/severe motion degradation of the axial T2/FLAIR sequence, moderate motion degradation of the axial T1 weighted postcontrast sequence and moderate motion degradation of the coronal T1 weighted postcontrast sequence. Mild generalized cerebral and cerebellar atrophy. Advanced patchy and confluent T2/FLAIR hyperintensity within the cerebral white matter, nonspecific but most often secondary to chronic small vessel ischemia. Chronic lacunar infarct within the left lentiform nucleus. Small foci of SWI signal loss within the left basal ganglia and along the margin of the posterior right lateral ventricle, likely reflecting chronic microhemorrhages. Additional subcentimeter focus of SWI signal loss within the midline cerebellum, which may  reflect an additional chronic microhemorrhage or cavernoma. There is no acute infarct. No evidence of intracranial mass. No extra-axial fluid  collection. No midline shift. Within the limitations of motion degradation, no abnormal intracranial enhancement is identified. Vascular: Expected proximal arterial flow voids. Non dominant intracranial right vertebral artery. Skull and upper cervical spine: Within the limitations of motion degradation, no focal marrow lesion is identified. Sinuses/Orbits: Visualized orbits show no acute finding. 1.9 cm right maxillary sinus mucous retention cyst. Trace left maxillary sinus mucosal thickening. Other: Trace bilateral mastoid effusions. 2.8 cm ovoid lesion within the left suboccipital scalp demonstrating heterogeneous but predominantly T2 hyperintense signal, T1 hypointense signal, restricted diffusion and no convincing abnormal enhancement. This is favored to reflect a sebaceous/epidermoid cyst. IMPRESSION: Motion degraded examination, as described and limiting evaluation. No evidence of acute intracranial abnormality. Severe patchy and confluent T2/FLAIR hyperintensity within the cerebral white matter, nonspecific but most often secondary to chronic small vessel ischemia. Chronic left basal ganglia lacunar infarct. Subcentimeter focus of SWI signal loss within the midline cerebellum, which may reflect a chronic microhemorrhage or cavernoma. Mild generalized parenchymal atrophy. Incidentally noted 1.9 cm right maxillary sinus mucous retention cyst. Trace bilateral mastoid effusions. Electronically Signed   By: Kellie Simmering DO   On: 09/18/2020 15:59   MR CERVICAL SPINE W WO CONTRAST  Result Date: 09/19/2020 CLINICAL DATA:  Acute or progressive myelopathy. EXAM: MRI CERVICAL SPINE WITHOUT AND WITH CONTRAST TECHNIQUE: Multiplanar and multiecho pulse sequences of the cervical spine, to include the craniocervical junction and cervicothoracic junction, were obtained without and with intravenous contrast. CONTRAST:  23mL GADAVIST GADOBUTROL 1 MMOL/ML IV SOLN COMPARISON:  No previous cervical imaging. FINDINGS: Alignment:  Straightening of the normal cervical lordosis. 1 mm degenerative anterolisthesis C7-T1. Vertebrae: No fracture or focal bone lesion. Cord: No primary cord lesion.  See below regarding stenosis. Posterior Fossa, vertebral arteries, paraspinal tissues: Large sebaceous cyst of the upper left neck. Disc levels: Foramen magnum is widely patent.  C1-2 and C2-3 are normal. C3-4: Endplate osteophytes and bulging of the disc. Canal stenosis with AP diameter in the midline 6 mm. Effacement of the subarachnoid space and slight indentation of the cord. Moderate bilateral foraminal narrowing. C4-5: Endplate osteophytes and protrusion of the disc more prominent towards the left. Effacement of the subarachnoid space and cord deformity, worse on the left. AP diameter of the canal 5.9 mm. Bilateral foraminal stenosis. C5-6: Spondylosis with endplate osteophytes and bulging of the disc. Canal narrowing with AP diameter in the midline 8.3 mm. No compressive effect upon the cord. Bilateral foraminal narrowing. C6-7: Mild bulging of the disc. No canal stenosis. Foraminal narrowing on the right that could affect the exiting C7 nerve. C7-T1: Facet osteoarthritis with 1 mm of anterolisthesis. No stenosis of the canal or foramina. 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 AP diameter of the canal at C3-4 6 mm. AP diameter of the spinal canal at C4-5 5.9 mm. Effacement of the subarachnoid space with some indentation of the cord. No abnormal cord T2 signal however. Foraminal stenosis that could cause neural compression on either side at C3-4, either side at C4-5, either side at C5-6 and on the right C6-7. Electronically Signed   By: Nelson Chimes M.D.   On: 09/19/2020 18:01   MR THORACIC SPINE W WO CONTRAST  Result Date: 09/19/2020 CLINICAL DATA:  Acute or progressive myelopathy. EXAM: MRI THORACIC WITHOUT AND WITH CONTRAST TECHNIQUE: Multiplanar and multiecho pulse sequences of the thoracic spine were  obtained  without and with intravenous contrast. CONTRAST:  90mL GADAVIST GADOBUTROL 1 MMOL/ML IV SOLN COMPARISON:  Cervical study same day FINDINGS: Alignment:  Normal Vertebrae: No fracture or focal bone lesion. Cord: No cord compression or primary cord lesion. Spinal canal widely patent throughout the region. No evidence of arteriovenous malformation. Paraspinal and other soft tissues: Negative Disc levels: No significant disc pathology in the thoracic region. Wide patency of the canal and foramina. Ordinary mild facet osteoarthritis without edema or enhancement. No encroachment upon the neural spaces. IMPRESSION: Negative thoracic study. No cord compression or cord lesion. No significant degenerative changes. Electronically Signed   By: Nelson Chimes M.D.   On: 09/19/2020 18:03   CT ABDOMEN PELVIS W CONTRAST  Result Date: 09/17/2020 CLINICAL DATA:  Hemoptysis, shortness of breath, abdominal distension EXAM: CT ANGIOGRAPHY CHEST CT ABDOMEN AND PELVIS WITH CONTRAST TECHNIQUE: Multidetector CT imaging of the chest was performed using the standard protocol during bolus administration of intravenous contrast. Multiplanar CT image reconstructions and MIPs were obtained to evaluate the vascular anatomy. Multidetector CT imaging of the abdomen and pelvis was performed using the standard protocol during bolus administration of intravenous contrast. CONTRAST:  5mL OMNIPAQUE IOHEXOL 350 MG/ML SOLN COMPARISON:  CT 06/20/2020, 06/22/2020 FINDINGS: CTA CHEST FINDINGS Cardiovascular: Satisfactory opacification of the pulmonary arteries to the segmental level. No evidence of pulmonary embolism. Thoracic aorta is normal in course and caliber. Atherosclerotic calcifications of the aorta and coronary arteries. Normal heart size. No pericardial effusion. Mediastinum/Nodes: Mildly prominent mediastinal lymph nodes are again noted, likely reactive. No axillary or hilar lymphadenopathy. Lungs/Pleura: Redemonstrated cavitary mass  located within the inferomedial aspect of the right upper lobe measuring approximately 9.9 x 6.6 x 7.4 cm (previously measured approximately 7.8 x 4.5 x 5.5 cm on 08/22/2020. Increased surrounding consolidation and ground-glass opacity within the adjacent right upper lobe and right middle lobe previously seen cavitary lesion within the medial aspect of the left upper lobe has resolved with small thin walled pleural cyst remaining (series 6, image 44). Scattered areas of ground-glass opacity within the bilateral lower lobes and upper lobes. Background of moderate emphysema. No pleural effusion or pneumothorax. Musculoskeletal: Remote right sixth and seventh rib fractures. No new or acute osseous findings. No chest wall abnormality. Review of the MIP images confirms the above findings. CT ABDOMEN and PELVIS FINDINGS Hepatobiliary: Cholelithiasis without evidence of pericholecystic inflammation by CT. Liver within normal limits. No biliary dilatation. Pancreas: Unremarkable. No pancreatic ductal dilatation or surrounding inflammatory changes. Spleen: Prior splenectomy. Adrenals/Urinary Tract: Unremarkable adrenal glands. Kidneys enhance symmetrically without focal lesion, stone, or hydronephrosis. Ureters are nondilated. Urinary bladder appears unremarkable. Stomach/Bowel: Stomach is within normal limits. Appendix appears normal (series 2, image 58). Incidental note of a small bowel-small bowel intussusception within the mid left abdomen (series 2, image 38). No evidence of bowel wall thickening, distention, or inflammatory changes. Vascular/Lymphatic: Fusiform infrarenal abdominal aortic aneurysm measuring up to 3.1 cm, unchanged. Aortoiliac atherosclerosis. No abdominopelvic lymphadenopathy. Reproductive: Prostate is unremarkable. Other: No free fluid. No abdominopelvic fluid collection. No pneumoperitoneum. Fat containing midline supraumbilical hernia without evidence of complication. Musculoskeletal: No acute or  significant osseous findings. Review of the MIP images confirms the above findings. IMPRESSION: 1. No evidence of acute pulmonary embolism. 2. Enlarging cavitary mass within the inferomedial aspect of the right upper lobe measuring up to 9.9 cm (previously measured approximately 7.8 x 4.5 x 5.5 cm on 08/22/2020). Increased surrounding consolidation and ground-glass opacity within the adjacent right upper lobe and right middle lobe. Findings  remain most compatible with cavitary/necrotizing pneumonia. 3. Scattered areas of ground-glass opacity within the bilateral lower lobes and upper lobes, concerning for multifocal infection. 4. Interval resolution of previously seen cavitary lesion within the medial aspect of the left upper lobe with small thin walled pleural cyst remaining. 5. Cholelithiasis without evidence of pericholecystic inflammation by CT. 6. Incidental note of a small bowel-small bowel intussusception within the mid left abdomen. No evidence of bowel obstruction. 7. Fat containing midline supraumbilical hernia without evidence of complication. 8. Stable infrarenal abdominal aortic aneurysm measuring up to 3.1 cm. Recommend follow-up ultrasound every 3 years. This recommendation follows ACR consensus guidelines: White Paper of the ACR Incidental Findings Committee II on Vascular Findings. J Am Coll Radiol 2013; 10:789-794. Aortic Atherosclerosis (ICD10-I70.0) and Emphysema (ICD10-J43.9). Electronically Signed   By: Davina Poke D.O.   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 There is increase in consolidation in this area compared to most recent study. Lungs elsewhere clear. Heart size and pulmonary vascular normal. No  adenopathy appreciable. No bone lesions. IMPRESSION: Cavitary mass right middle lobe anteriorly with increase in surrounding consolidation compared to most recent study. The lungs elsewhere are clear. Heart size normal. No adenopathy appreciable by radiography. Electronically Signed   By: Lowella Grip III M.D.   On: 09/17/2020 13:54   DG Chest Port 1 View  Result Date: 08/28/2020 CLINICAL DATA:  Hypoxia. EXAM: PORTABLE CHEST 1 VIEW COMPARISON:  CT 08/22/2020.  Chest x-ray 08/22/2020. FINDINGS: Mediastinum hilar structures normal. Heart size normal. Cavitary process in the right mid lung and left upper lung best identified by prior CT. No new findings are noted. No pleural effusion or pneumothorax. Heart size stable. Degenerative change thoracic spine. Old right rib fractures present. IMPRESSION: Cavitary process in the right mid lung and left upper lung best identified by prior CT. Chest is unchanged from prior exam. Electronically Signed   By: Marcello Moores  Register   On: 08/28/2020 08:25   DG Chest Portable 1 View  Result Date: 08/22/2020 CLINICAL DATA:  Shortness of breath EXAM: PORTABLE CHEST 1 VIEW COMPARISON:  August 04, 2020 FINDINGS: There is apparent scarring in the right perihilar region. There is suggestion of cavitation in this area in the right perihilar region. Slight scarring lateral right base. The lungs elsewhere are clear. Heart size and pulmonary vascularity are normal. No appreciable adenopathy. No bone lesions. IMPRESSION: Scarring right mid lung. Suggestion of developing cavitation in this area which could be due to overlapping of areas of scarring and normal structures. Given concern for cavitation in this area, noncontrast chest CT advised to further evaluate. Slight scarring lateral right base. Lungs elsewhere are clear. Heart size normal. No adenopathy evident by radiography. Electronically Signed   By: Lowella Grip III M.D.   On: 08/22/2020 13:24   DG Swallowing Func-Speech  Pathology  Result Date: 09/06/2020 Please refer to "Notes" tab for Speech Pathology notes.  ECHOCARDIOGRAM COMPLETE  Result Date: 08/28/2020    ECHOCARDIOGRAM REPORT   Patient Name:   ADONIJAH BAENA [facility] [place] Date of Exam: 08/28/2020 Medical Rec #:  [id number]    Height:       66.0 in Accession #:    [id number]   Weight:  133.6 lb Date of Birth:  [birth year]     BSA:          1.685 m Patient Age:    65 years     BP:           120/74 mmHg Patient Gender: M            HR:           76 bpm. 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 COPD. Tobacco use disorder.  Sonographer:     Sherrie Sport RDCS (AE) Referring Phys:  [id number] Sharen Hones Diagnosing Phys: Nelva Bush MD  Sonographer Comments: Technically challenging study due to limited acoustic windows. Image acquisition challenging due to COPD. IMPRESSIONS  1. Left ventricular ejection fraction, by estimation, is 55 to 60%. The left ventricle has normal function. Left ventricular endocardial border not optimally defined to evaluate regional wall motion. Left ventricular diastolic parameters are consistent with Grade I diastolic dysfunction (impaired relaxation).  2. Right ventricular systolic function is normal. The right ventricular size is mildly enlarged. Mildly increased right ventricular wall thickness. There is normal pulmonary artery systolic pressure.  3. The mitral valve is degenerative. Trivial mitral valve regurgitation. No evidence of mitral stenosis.  4. The aortic valve was not well visualized. Aortic valve regurgitation is not visualized. No aortic stenosis is present.  5. Pulmonic valve regurgitation not well assessed.  6. The inferior vena cava is normal in size with greater than 50% respiratory variability, suggesting right atrial pressure of 3 mmHg.  Conclusion(s)/Recommendation(s): Valves suboptimally imaged to exclude endocarditis. If clinical concern persists, transesophageal echocardiogram should be considered. FINDINGS  Left Ventricle: Left ventricular ejection fraction, by estimation, is 55 to 60%. The left ventricle has normal function. Left ventricular endocardial border not optimally defined to evaluate regional wall motion. The left ventricular internal cavity size was normal in size. There is no left ventricular hypertrophy. Left ventricular diastolic parameters are consistent with Grade I diastolic dysfunction (impaired relaxation). Right Ventricle: The right ventricular size is mildly enlarged. Mildly increased right ventricular wall thickness. Right ventricular systolic function is normal. There is normal pulmonary artery systolic pressure. The tricuspid regurgitant velocity is 1.72 m/s, and with an assumed right atrial pressure of 3 mmHg, the estimated right ventricular systolic pressure is 68.3 mmHg. Left Atrium: Left atrial size was normal in size. Right Atrium: Right atrial size was normal in size. Pericardium: There is no evidence of pericardial effusion. Mitral Valve: The mitral valve is degenerative in appearance. There is mild thickening of the mitral valve leaflet(s). There is mild calcification of the mitral valve leaflet(s). Mild mitral annular calcification. Trivial mitral valve regurgitation. No evidence of mitral valve stenosis. Tricuspid Valve: The tricuspid valve is grossly normal. Tricuspid valve regurgitation is trivial. Aortic Valve: The aortic valve was not well visualized. Aortic valve regurgitation is not visualized. No aortic stenosis is present. Aortic valve mean gradient measures 3.0 mmHg. Aortic valve peak gradient measures 4.8 mmHg. Aortic valve area, by VTI measures 2.04 cm. Pulmonic Valve: The pulmonic valve was not well visualized. Pulmonic valve regurgitation not well assessed. Aorta: The aortic root is normal in  size and structure. Pulmonary Artery: The pulmonary artery is not well seen.  Venous: The inferior vena cava is normal in size with greater than 50% respiratory variability, suggesting right atrial pressure of 3 mmHg. IAS/Shunts: The interatrial septum was not well visualized.  LEFT VENTRICLE PLAX 2D LVIDd:         5.38 cm  Diastology LVIDs:         2.94 cm  LV e' medial:    6.09 cm/s LV PW:         1.02 cm  LV E/e' medial:  10.3 LV IVS:        0.73 cm  LV e' lateral:   11.40 cm/s LVOT diam:     2.10 cm  LV E/e' lateral: 5.5 LV SV:         45 LV SV Index:   27 LVOT Area:     3.46 cm  RIGHT VENTRICLE RV Basal diam:  4.23 cm RV S prime:     9.46 cm/s TAPSE (M-mode): 2.0 cm LEFT ATRIUM             Index       RIGHT ATRIUM           Index LA diam:        3.50 cm 2.08 cm/m  RA Area:     16.50 cm LA Vol (A2C):   30.0 ml 17.81 ml/m RA Volume:   44.10 ml  26.18 ml/m LA Vol (A4C):   39.3 ml 23.33 ml/m LA Biplane Vol: 35.6 ml 21.13 ml/m  AORTIC VALVE                   PULMONIC VALVE AV Area (Vmax):    1.89 cm    PV Vmax:        0.88 m/s AV Area (Vmean):   2.10 cm    PV Peak grad:   3.1 mmHg AV Area (VTI):     2.04 cm    RVOT Peak grad: 3 mmHg AV Vmax:           110.00 cm/s AV Vmean:          74.000 cm/s AV VTI:            0.219 m AV Peak Grad:      4.8 mmHg AV Mean Grad:      3.0 mmHg LVOT Vmax:         60.10 cm/s LVOT Vmean:        44.800 cm/s LVOT VTI:          0.129 m LVOT/AV VTI ratio: 0.59  AORTA Ao Root diam: 3.60 cm MITRAL VALVE               TRICUSPID VALVE MV Area (PHT): 4.86 cm    TR Peak grad:   11.8 mmHg MV Decel Time: 156 msec    TR Vmax:        172.00 cm/s MV E velocity: 62.60 cm/s MV A velocity: 86.50 cm/s  SHUNTS MV E/A ratio:  0.72        Systemic VTI:  0.13 m                            Systemic Diam: 2.10 cm Nelva Bush MD Electronically signed by Nelva Bush MD Signature Date/Time: 08/28/2020/9:56:00 AM    Final       ASSESSMENT/PLAN   Presented in respiratory failure, non massive  hemoptysis,  enlarging RUL density with crescent air sign. 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 Meds adjusted. The hemoptysis is from the infection (on going necrosis). His neutropenia not helping. Heme involved.  -will f/u on the pending ID serologies that are pending -sputum c/s, afb, fungal.  -continue present antibiotic regimen -incentive spirometry -possible bronch to r/o other causes - if physically able may need t -surg input regarding surgical intervention to aid in the control of the rul infection     Thank you for allowing me to participate in the care of this patient.   Patient/Family are satisfied with care plan and all questions have been answered.  This document was prepared using Dragon voice recognition software and may include unintentional dictation errors.     Wallene Huh, M.D.  Division of [place]

## 2020-09-20 NOTE — Progress Notes (Addendum)
Neurology Progress Note  Patient ID: Marcus [name] is a 60 y.o. 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  Initially consulted for: Shaking spell  Major interval events/Subjective: - PT notes significant decline since their last evaluation during his prior hospitalization 3 weeks ago  - Patient continues to deny incontinence during waking hours though brother (at bedside today, Marcus [name]) again contests this - No charted BMs yet this hospitalization, nursing confirms this is accurate.   Exam: Vitals:   09/20/20 0500 09/20/20 0812  BP:  119/74  Pulse:  72  Resp:  16  Temp: 97.8 F (36.6 C) (!) 97.4 F (36.3 C)  SpO2:  95%   Gen: In bed, more comfortable today, chronically ill appearing but brighter Psych: in good spirits, joking a bit  Resp: Improved breathing / wheezing, reduced coughing  Cardiac: Perfusing extremities well  Abd: soft, nt  Neuro: MS: Awake, oriented to self, situation, following commands, fluent speech CN: EOMI, face symmetric, tongue midline, Motor: Diffuse muscle wasting. 5/5 throughout today expect for 4+/5 in the deltoids bilaterally, 4/5 hip flexion, 4/5 food dorsiflexion (bilaterally symmetric). Anal wink intact.  Sensory: No sensory level on the back. Vibration intact at the toes. No saddle anesthesia DTR: remains very hyperrelexive, will not relax ankles enough to allow clonus testing, but 3-4 beats of clonus at the patellae, L > R, 3+ and symmetric biceps/brachioradials.   Pertinent data:  Basic Metabolic Panel: Recent Labs  Lab 09/17/20 1345 09/18/20 0533 09/19/20 0506 09/20/20 0443  NA 131* 131* 136 138  K 3.3* 3.6 3.4* 3.9  CL 96* 104 103 101  CO2 28 26 24 31   GLUCOSE 137* 72 163* 132*  BUN 33* 20 14 14   CREATININE 0.78 0.48* 0.48* 0.34*  CALCIUM 7.9* 7.2* 7.8*  8.2*  MG  --   --  1.8 2.0    CBC: Recent Labs  Lab 09/17/20 1345 09/18/20 0533 09/19/20 0506 09/20/20 0443  WBC 3.1* 4.9 3.7* 1.5*  NEUTROABS 1.5*  --  2.5 0.5*  HGB 12.9* 9.8* 9.8* 10.0*  HCT 40.2 29.7* 29.0* 31.3*  MCV 84.1 83.4 82.4 84.8  PLT 190 203 256 262    Coagulation Studies: Recent Labs    09/17/20 1345  LABPROT 14.0  INR 1.1      EEG within normal limits  MRI C- and T-spine personally reviewed, significant stenosis at C4/C5 without clear cord signal change, no significant T-spine pathology  Full C-spine radiology impression: 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 AP diameter of the canal at C3-4 6 mm. AP diameter of the spinal canal at C4-5 5.9 mm. Effacement of the subarachnoid space with some indentation of the cord. No abnormal cord T2 signal however. Foraminal stenosis that could cause neural compression on either side at C3-4, either side at C4-5, either side at C5-6 and on the right C6-7.  Impression: Low concern for seizure activity. Generalized weakness likely multifactorial (deconditioning, malnutrition, respiratory limitations, sepsis). History regarding incontinence remains unclear but patient is stronger on examination today than yesterday and other than hyperreflexia, exam reassuring against significant spinal cord injury at this time.   Recommendations: - Outpatient neurosurgery referral for C-spine stenosis  - Should he have any further decline (develops stool incontinence here, worsening weakness despite improving medical condition, new numbness including saddle anesthesia etc) please reach out to neurosurgery  more urgently  - Neurology will be available on an as-needed basis going forward, please reach out if new questions arise.   Lesleigh Noe MD-PhD [facility] [phone]   >25 minutes spent in care of this patient today, > 50% at bedside, performing examination and discussing with patient and  family at bedside the results and plan of care as above.

## 2020-09-20 NOTE — Progress Notes (Signed)
[facility] PROGRESS NOTE    Marcus [name]  EXH:[id number] DOB: 1961-03-13 DOA: 09/17/2020 PCP: [name], FNP    Brief Narrative:  Mr. Devonshire is a 60 y.o. M with RA c/b Felty syndrome/neutropenia, smoking, and COPD as well as recently diagnosed MRSA abscess who presents with failure of outpatient treamtent.  Patient admitted last month for 8 days for cough and shortness of breath as well as hemoptysis found to have a large cavitary lesion in the right upper lobe concerning for lung abscess.  Sputum was positive for MRSA and so he was discharged on 6 weeks of oral doxycycline.  He reports adherence to his medication after some initial improvement with IV vancomycin in the hospital, he then started to get persistently worse, finally was coughing up blood and had chest pain and malaise/fatigue, so he came to the ER.  He was started on linezolid by ID, continue to have some hemoptysis, pulmonology is recommending bronchoscopy and a possible surgery.  EEG was negative for any seizure-like activity.  Imaging with concern of spinal stenosis but no emergent need for any intervention.  Neurology is recommending outpatient neurosurgery evaluation once improved from current hospitalization.  If patient developed any incontinence or worsening focal weakness then neurosurgery should be consulted urgently.  Patient was started on Granix due to worsening neutropenia today.  Assessment & Plan:  Acute on chronic respiratory failure with hypoxia At baseline he is on 2 L.  Currently on 3 to 4 L of oxygen. -Continue with incentive spirometry and flutter valve -Continue with supplemental oxygen  Necrotizing pneumonia causing sepsis Presented with tachycardia, tachypnea, leukopenia, and blood pressure less than 100. Initially received vancomycin, cefepime and Flagyl later transitioned to linezolid by ID Pulmonology is recommending bronchoscopy, multiple labs including Fungitell is  pending, concern of aspergilloma, might need bronchoscopy or a surgery. -Continue with linezolid  Neutropenia.  Noted during last hospitalization.  Little worsening of leukopenia and neutropenia. -Patient was started on Granix by hematology today. 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  Resolved -Replete potassium as needed and monitor  Shakiness.  There was some witnessed shakiness, neurology was consulted for concern of seizure-like activity.  No shakiness or any other seizure noted.  EEG normal. Neurology signed off and they are recommending outpatient neurosurgery evaluation for some spinal stenosis, no emergent need.  If patient developed any new focal weakness or incontinence then they can be reconsulted and at that point he will need emergent neurosurgery evaluation. -Continue to monitor  Chronic diastolic CHF Euvolemic  Intussusception This is an apparently incidental finding, no evidence of small bowel obstruction, belly exam benign - Advance diet as tolerated -Stop PPI   Anemia of chronic disease Hgb stable with some improvement at 10 today in setting of mild hemoptysis -Trend Hgb -Monitor for ongoing bleeding  Hyponatremia Mild, asymptoamtic   Smoking Cessation recommended, modalities discussed  Severe protein calorie malnutrition As evidenced by severe loss of subcutaneous muscle mass and fat, chronic infection, smoking -Consutl dietitian  Disposition: Status is: Inpatient  Remains inpatient appropriate because: protect  failed outpatient therapy, still on higher than baseline O2, requires ongoing diagnostic testing perhaps as dictated by Pulmonogloy  Dispo: The patient is from: Home              Anticipated d/c is to: Home              Patient currently is not  medically stable to d/c.   Difficult to place patient No              Level of care: Med-Surg  MDM: The  below labs and imaging reports were reviewed and summarized above.  Medication management as above.  This is a severe threat to life and bodily function    DVT prophylaxis:   SCDs.  Code Status: Full code Family Communication: Brother was updated at bedside  Subjective: Patient seems little improved.  Continue to have some cough, less hemoptysis.  Brother at bedside.  Objective: Vitals:   09/20/20 0424 09/20/20 0500 09/20/20 0812 09/20/20 1222  BP: 131/86  119/74 (!) 143/75  Pulse: 70  72 85  Resp: 20  16 20   Temp:  97.8 F (36.6 C) (!) 97.4 F (36.3 C) 97.6 F (36.4 C)  TempSrc:  Oral Oral Oral  SpO2: 96%  95% 92%  Weight:        Intake/Output Summary (Last 24 hours) at 09/20/2020 1522 Last data filed at 09/20/2020 1200 Gross per 24 hour  Intake --  Output 1650 ml  Net -1650 ml    Filed Weights   09/19/20 1000  Weight: 63.7 kg    Examination: General.  Chronically ill-appearing gentleman, in no acute distress. Pulmonary.  Few rhonchi involving right upper and mid zone, normal respiratory effort. CV.  Regular rate and rhythm, no JVD, rub or murmur. Abdomen.  Soft, nontender, nondistended, BS positive. CNS.  Alert and oriented x3.  No focal neurologic deficit. Extremities.  No edema, no cyanosis, pulses intact and symmetrical. Psychiatry.  Judgment and insight appears normal.   Data Reviewed: I have personally reviewed following labs and imaging studies:  CBC: Recent Labs  Lab 09/17/20 1345 09/18/20 0533 09/19/20 0506 09/20/20 0443  WBC 3.1* 4.9 3.7* 1.5*  NEUTROABS 1.5*  --  2.5 0.5*  HGB 12.9* 9.8* 9.8* 10.0*  HCT 40.2 29.7* 29.0* 31.3*  MCV 84.1 83.4 82.4 84.8  PLT 190 203 256 527    Basic Metabolic Panel: Recent Labs  Lab 09/17/20 1345 09/18/20 0533 09/19/20 0506 09/20/20 0443  NA 131* 131* 136 138  K 3.3* 3.6 3.4* 3.9  CL 96* 104 103 101  CO2 28 26 24 31   GLUCOSE 137* 72 163* 132*  BUN 33* 20 14 14   CREATININE 0.78 0.48* 0.48* 0.34*   CALCIUM 7.9* 7.2* 7.8* 8.2*  MG  --   --  1.8 2.0    GFR: Estimated Creatinine Clearance: 89.6 mL/min (A) (by C-G formula based on SCr of 0.34 mg/dL (L)). Liver Function Tests: Recent Labs  Lab 09/17/20 1345 09/18/20 0533  AST 21 15  ALT 12 9  ALKPHOS 276* 168*  BILITOT 1.0 0.8  PROT 6.7 5.0*  ALBUMIN 2.4* 1.8*    No results for input(s): LIPASE, AMYLASE in the last 168 hours. No results for input(s): AMMONIA in the last 168 hours. Coagulation Profile: Recent Labs  Lab 09/17/20 1345  INR 1.1    Cardiac Enzymes: No results for input(s): CKTOTAL, CKMB, CKMBINDEX, TROPONINI in the last 168 hours. BNP (last 3 results) No results for input(s): PROBNP in the last 8760 hours. HbA1C: No results for input(s): HGBA1C in the last 72 hours. CBG: No results for input(s): GLUCAP in the last 168 hours. Lipid Profile: No results for input(s): CHOL, HDL, LDLCALC, TRIG, CHOLHDL, LDLDIRECT in the last 72 hours. Thyroid Function Tests: No results for input(s): TSH, T4TOTAL, FREET4, T3FREE, THYROIDAB in the last 72 hours. Anemia  Panel: No results for input(s): VITAMINB12, FOLATE, FERRITIN, TIBC, IRON, RETICCTPCT in the last 72 hours. Urine analysis:    Component Value Date/Time   COLORURINE AMBER (A) 08/22/2020 1253   APPEARANCEUR HAZY (A) 08/22/2020 1253   LABSPEC 1.024 08/22/2020 1253   PHURINE 5.0 08/22/2020 [place] 08/22/2020 1253   HGBUR NEGATIVE 08/22/2020 [place] 08/22/2020 [place] 08/22/2020 1253   PROTEINUR NEGATIVE 08/22/2020 1253   UROBILINOGEN 0.2 10/21/2012 1737   NITRITE NEGATIVE 08/22/2020 1253   LEUKOCYTESUR NEGATIVE 08/22/2020 1253   Sepsis Labs: @LABRCNTIP (procalcitonin:4,lacticacidven:4)  ) Recent Results (from the past 240 hour(s))  Blood culture (routine x 2)     Status: None (Preliminary result)   Collection Time: 09/17/20  1:58 PM   Specimen: BLOOD  Result Value Ref Range Status   Specimen  Description BLOOD BLOOD LEFT FOREARM  Final   Special Requests   Final    BOTTLES DRAWN AEROBIC AND ANAEROBIC Blood Culture adequate volume   Culture   Final    NO GROWTH 3 DAYS Performed at [facility], [address] [place], [place] [address]    Report Status PENDING  Incomplete  Blood culture (routine x 2)     Status: None (Preliminary result)   Collection Time: 09/17/20  1:59 PM   Specimen: BLOOD  Result Value Ref Range Status   Specimen Description BLOOD BLOOD RIGHT FOREARM  Final   Special Requests   Final    BOTTLES DRAWN AEROBIC AND ANAEROBIC Blood Culture results may not be optimal due to an excessive volume of blood received in culture bottles   Culture   Final    NO GROWTH 3 DAYS Performed at [facility], [address], [place], [place] [address]    Report Status PENDING  Incomplete  Resp Panel by RT-PCR (Flu A&B, Covid) Nasopharyngeal Swab     Status: None   Collection Time: 09/17/20  2:14 PM   Specimen: Nasopharyngeal Swab; Nasopharyngeal(NP) swabs in vial transport medium  Result Value Ref Range Status   SARS Coronavirus 2 by RT PCR NEGATIVE NEGATIVE Final    Comment: (NOTE) SARS-CoV-2 target nucleic acids are NOT DETECTED.  The SARS-CoV-2 RNA is generally detectable in upper respiratory specimens during the acute phase of infection. The lowest concentration of SARS-CoV-2 viral copies this assay can detect is 138 copies/mL. A negative result does not preclude SARS-Cov-2 infection and should not be used as the sole basis for treatment or other patient management decisions. A negative result may occur with  improper specimen collection/handling, submission of specimen other than nasopharyngeal swab, presence of viral mutation(s) within the areas targeted by this assay, and inadequate number of viral copies(<138 copies/mL). A negative result must be combined with clinical observations, patient history, and epidemiological information. The  expected result is Negative.  Fact Sheet for Patients:  EntrepreneurPulse.com.au  Fact Sheet for Healthcare Providers:  IncredibleEmployment.be  This test is no t yet approved or cleared by the [place] FDA and  has been authorized for detection and/or diagnosis of SARS-CoV-2 by FDA under an Emergency Use Authorization (EUA). This EUA will remain  in effect (meaning this test can be used) for the duration of the COVID-19 declaration under Section 564(b)(1) of the Act, 21 U.S.C.section 360bbb-3(b)(1), unless the authorization is terminated  or revoked sooner.       Influenza A by PCR NEGATIVE NEGATIVE Final   Influenza B by PCR NEGATIVE NEGATIVE Final  Comment: (NOTE) The Xpert Xpress SARS-CoV-2/FLU/RSV plus assay is intended as an aid in the diagnosis of influenza from Nasopharyngeal swab specimens and should not be used as a sole basis for treatment. Nasal washings and aspirates are unacceptable for Xpert Xpress SARS-CoV-2/FLU/RSV testing.  Fact Sheet for Patients: EntrepreneurPulse.com.au  Fact Sheet for Healthcare Providers: IncredibleEmployment.be  This test is not yet approved or cleared by the [place] FDA and has been authorized for detection and/or diagnosis of SARS-CoV-2 by FDA under an Emergency Use Authorization (EUA). This EUA will remain in effect (meaning this test can be used) for the duration of the COVID-19 declaration under Section 564(b)(1) of the Act, 21 U.S.C. section 360bbb-3(b)(1), unless the authorization is terminated or revoked.  Performed at [facility], [address] [place], [place] [address]   MRSA PCR Screening     Status: Abnormal   Collection Time: 09/18/20  8:36 AM   Specimen: Nasopharyngeal  Result Value Ref Range Status   MRSA by PCR POSITIVE (A) NEGATIVE Final    Comment:        The GeneXpert MRSA Assay (FDA approved for NASAL  specimens only), is one component of a comprehensive MRSA colonization surveillance program. It is not intended to diagnose MRSA infection nor to guide or monitor treatment for MRSA infections. RESULT CALLED TO, READ BACK BY AND VERIFIED WITH: Leota Jacobsen 1029 09/18/20 GM Performed at [facility], [address] [place], [place] [address]   Expectorated Sputum Assessment w Gram Stain, Rflx to Resp Cult     Status: None   Collection Time: 09/20/20  5:33 AM   Specimen: Expectorated Sputum  Result Value Ref Range Status   Specimen Description EXPECTORATED SPUTUM  Final   Special Requests NONE  Final   Sputum evaluation   Final    THIS SPECIMEN IS ACCEPTABLE FOR SPUTUM CULTURE Performed at [facility], [address] [place], [place] [address]    Report Status 09/20/2020 FINAL  Final  Culture, Respiratory w Gram Stain     Status: None (Preliminary result)   Collection Time: 09/20/20  5:33 AM  Result Value Ref Range Status   Specimen Description   Final    EXPECTORATED SPUTUM Performed at [facility], [address] [place], [place] [address]    Special Requests   Final    NONE Reflexed from [id number] Performed at [facility], [address] [place], [place] [address]    Gram Stain   Final    ABUNDANT WBC PRESENT,BOTH PMN AND MONONUCLEAR FEW GRAM POSITIVE COCCI IN PAIRS IN CLUSTERS Performed at [facility], [place] [address] [place], [place] [address]    Culture PENDING  Incomplete   Report Status PENDING  Incomplete  Anaerobic culture w Gram Stain     Status: None (Preliminary result)   Collection Time: 09/20/20  5:33 AM   Specimen: Expectorated Sputum  Result Value Ref Range Status   Specimen Description   Final    EXPECTORATED SPUTUM Performed at [facility], [address], [place],  [address]    Special Requests   Final    Normal Performed at [place], [place] [address]    Gram Stain   Final    RARE WBC PRESENT,BOTH PMN AND MONONUCLEAR RARE GRAM POSITIVE COCCI IN PAIRS IN CLUSTERS Performed at [facility], [place] [address] [place],  [address]    Culture PENDING  Incomplete  Report Status PENDING  Incomplete         Radiology Studies: MR BRAIN W WO CONTRAST  Result Date: 09/18/2020 CLINICAL DATA:  Brain mass or lesion. 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 Recent MRSA pneumonia. Patient reports visual symptoms and seizure-like activity a few weeks ago. EXAM: MRI HEAD WITHOUT AND WITH CONTRAST TECHNIQUE: Multiplanar, multiecho pulse sequences of the brain and surrounding structures were obtained without and with intravenous contrast. CONTRAST:  86mL GADAVIST GADOBUTROL 1 MMOL/ML IV SOLN COMPARISON:  No pertinent prior exams available for comparison. FINDINGS: Brain: Intermittently motion degraded examination, limiting evaluation. Most notably, there is moderate/severe motion degradation of the sagittal T1 weighted sequence, moderate/severe motion degradation of the axial T2/FLAIR sequence, moderate motion degradation of the axial T1 weighted postcontrast sequence and moderate motion degradation of the coronal T1 weighted postcontrast sequence. Mild generalized cerebral and cerebellar atrophy. Advanced patchy and confluent T2/FLAIR hyperintensity within the cerebral white matter, nonspecific but most often secondary to chronic small vessel ischemia. Chronic lacunar infarct within the left lentiform nucleus. Small foci of SWI signal loss within the left basal ganglia and along the margin of the posterior right lateral ventricle, likely reflecting chronic microhemorrhages. Additional subcentimeter focus of SWI signal loss within the midline cerebellum, which may reflect an additional chronic microhemorrhage or cavernoma. There is no acute infarct. No evidence of  intracranial mass. No extra-axial fluid collection. No midline shift. Within the limitations of motion degradation, no abnormal intracranial enhancement is identified. Vascular: Expected proximal arterial flow voids. Non dominant intracranial right vertebral artery. Skull and upper cervical spine: Within the limitations of motion degradation, no focal marrow lesion is identified. Sinuses/Orbits: Visualized orbits show no acute finding. 1.9 cm right maxillary sinus mucous retention cyst. Trace left maxillary sinus mucosal thickening. Other: Trace bilateral mastoid effusions. 2.8 cm ovoid lesion within the left suboccipital scalp demonstrating heterogeneous but predominantly T2 hyperintense signal, T1 hypointense signal, restricted diffusion and no convincing abnormal enhancement. This is favored to reflect a sebaceous/epidermoid cyst. IMPRESSION: Motion degraded examination, as described and limiting evaluation. No evidence of acute intracranial abnormality. Severe patchy and confluent T2/FLAIR hyperintensity within the cerebral white matter, nonspecific but most often secondary to chronic small vessel ischemia. Chronic left basal ganglia lacunar infarct. Subcentimeter focus of SWI signal loss within the midline cerebellum, which may reflect a chronic microhemorrhage or cavernoma. Mild generalized parenchymal atrophy. Incidentally noted 1.9 cm right maxillary sinus mucous retention cyst. Trace bilateral mastoid effusions. Electronically Signed   By: Kellie Simmering DO   On: 09/18/2020 15:59   MR CERVICAL SPINE W WO CONTRAST  Result Date: 09/19/2020 CLINICAL DATA:  Acute or progressive myelopathy. EXAM: MRI CERVICAL SPINE WITHOUT AND WITH CONTRAST TECHNIQUE: Multiplanar and multiecho pulse sequences of the cervical spine, to include the craniocervical junction and cervicothoracic junction, were obtained without and with intravenous contrast. CONTRAST:  37mL GADAVIST GADOBUTROL 1 MMOL/ML IV SOLN COMPARISON:  No  previous cervical imaging. FINDINGS: Alignment: Straightening of the normal cervical lordosis. 1 mm degenerative anterolisthesis C7-T1. Vertebrae: No fracture or focal bone lesion. Cord: No primary cord lesion.  See below regarding stenosis. Posterior Fossa, vertebral arteries, paraspinal tissues: Large sebaceous cyst of the upper left neck. Disc levels: Foramen magnum is widely patent.  C1-2 and C2-3 are normal. C3-4: Endplate osteophytes and bulging of the disc. Canal stenosis with AP diameter in the midline 6 mm. Effacement of the subarachnoid space and slight indentation of the cord. Moderate bilateral foraminal  narrowing. C4-5: Endplate osteophytes and protrusion of the disc more prominent towards the left. Effacement of the subarachnoid space and cord deformity, worse on the left. AP diameter of the canal 5.9 mm. Bilateral foraminal stenosis. C5-6: Spondylosis with endplate osteophytes and bulging of the disc. Canal narrowing with AP diameter in the midline 8.3 mm. No compressive effect upon the cord. Bilateral foraminal narrowing. C6-7: Mild bulging of the disc. No canal stenosis. Foraminal narrowing on the right that could affect the exiting C7 nerve. C7-T1: Facet osteoarthritis with 1 mm of anterolisthesis. No stenosis of the canal or foramina. 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 AP diameter of the canal at C3-4 6 mm. AP diameter of the spinal canal at C4-5 5.9 mm. Effacement of the subarachnoid space with some indentation of the cord. No abnormal cord T2 signal however. Foraminal stenosis that could cause neural compression on either side at C3-4, either side at C4-5, either side at C5-6 and on the right C6-7. Electronically Signed   By: Nelson Chimes M.D.   On: 09/19/2020 18:01   MR THORACIC SPINE W WO CONTRAST  Result Date: 09/19/2020 CLINICAL DATA:  Acute or progressive myelopathy. EXAM: MRI THORACIC WITHOUT AND WITH CONTRAST TECHNIQUE: Multiplanar and  multiecho pulse sequences of the thoracic spine were obtained without and with intravenous contrast. CONTRAST:  57mL GADAVIST GADOBUTROL 1 MMOL/ML IV SOLN COMPARISON:  Cervical study same day FINDINGS: Alignment:  Normal Vertebrae: No fracture or focal bone lesion. Cord: No cord compression or primary cord lesion. Spinal canal widely patent throughout the region. No evidence of arteriovenous malformation. Paraspinal and other soft tissues: Negative Disc levels: No significant disc pathology in the thoracic region. Wide patency of the canal and foramina. Ordinary mild facet osteoarthritis without edema or enhancement. No encroachment upon the neural spaces. IMPRESSION: Negative thoracic study. No cord compression or cord lesion. No significant degenerative changes. Electronically Signed   By: Nelson Chimes M.D.   On: 09/19/2020 18:03      Scheduled Meds:  Chlorhexidine Gluconate Cloth  6 each Topical Q0600   feeding supplement  237 mL Oral TID BM   guaiFENesin  600 mg Oral BID   multivitamin with minerals  1 tablet Oral Daily   mupirocin ointment   Nasal BID   nicotine  21 mg Transdermal Daily   predniSONE  10 mg Oral BID WC   Tbo-filgastrim (GRANIX) SQ  480 mcg Subcutaneous Daily   Continuous Infusions:  linezolid (ZYVOX) IV 600 mg (09/20/20 0903)     LOS: 3 days    Time spent: 35 minutes  This record has been created using Systems analyst. Errors have been sought and corrected,but may not always be located. Such creation errors do not reflect on the standard of care.   Lorella Nimrod, MD [facility] 09/20/2020, 3:22 PM     Please page though Butts or Epic secure chat:  For [employer], Adult nurse

## 2020-09-20 NOTE — Progress Notes (Signed)
Marcus [name]   DOB:11-22-1960   VZ#:[id number]    Subjective: No acute events overnight.  Continues to complain of shortness of breath on exertion.  Chronic cough.no hemoptysis.  Objective:  Vitals:   09/20/20 1222 09/20/20 1636  BP: (!) 143/75 132/79  Pulse: 85 79  Resp: 20 (!) 22  Temp: 97.6 F (36.4 C) (!) 97.5 F (36.4 C)  SpO2: 92% 97%     Intake/Output Summary (Last 24 hours) at 09/20/2020 2003 Last data filed at 09/20/2020 1800 Gross per 24 hour  Intake 300 ml  Output 1150 ml  Net -850 ml    Physical Exam Vitals and nursing note reviewed.  Constitutional:      Comments: Patient resting in the bed.  Accompanied by: none  HENT:     Head: Normocephalic and atraumatic.     Mouth/Throat:     Mouth: Mucous membranes are moist.     Pharynx: No oropharyngeal exudate.  Eyes:     Pupils: Pupils are equal, round, and reactive to light.  Cardiovascular:     Rate and Rhythm: Normal rate and regular rhythm.  Pulmonary:     Effort: No respiratory distress.     Breath sounds: No wheezing.     Comments: Decreased breath sounds bilaterally at bases.  No wheeze or crackles Abdominal:     General: Bowel sounds are normal. There is no distension.     Palpations: Abdomen is soft. There is no mass.     Tenderness: There is no abdominal tenderness. There is no guarding or rebound.  Musculoskeletal:        General: No tenderness. Normal range of motion.     Cervical back: Normal range of motion and neck supple.  Skin:    General: Skin is warm.  Neurological:     Mental Status: He is alert and oriented to person, place, and time.  Psychiatric:        Mood and Affect: Affect normal.        Judgment: Judgment normal.     Labs:  Lab Results  Component Value Date   WBC 1.5 (L) 09/20/2020   HGB 10.0 (L) 09/20/2020   HCT 31.3 (L) 09/20/2020   MCV 84.8 09/20/2020   PLT 262 09/20/2020   NEUTROABS 0.5 (L) 09/20/2020    Lab Results  Component Value Date   NA 138 09/20/2020   K  3.9 09/20/2020   CL 101 09/20/2020   CO2 31 09/20/2020    Studies:  MR CERVICAL SPINE W WO CONTRAST  Result Date: 09/19/2020 CLINICAL DATA:  Acute or progressive myelopathy. EXAM: MRI CERVICAL SPINE WITHOUT AND WITH CONTRAST TECHNIQUE: Multiplanar and multiecho pulse sequences of the cervical spine, to include the craniocervical junction and cervicothoracic junction, were obtained without and with intravenous contrast. CONTRAST:  38mL GADAVIST GADOBUTROL 1 MMOL/ML IV SOLN COMPARISON:  No previous cervical imaging. FINDINGS: Alignment: Straightening of the normal cervical lordosis. 1 mm degenerative anterolisthesis C7-T1. Vertebrae: No fracture or focal bone lesion. Cord: No primary cord lesion.  See below regarding stenosis. Posterior Fossa, vertebral arteries, paraspinal tissues: Large sebaceous cyst of the upper left neck. Disc levels: Foramen magnum is widely patent.  C1-2 and C2-3 are normal. C3-4: Endplate osteophytes and bulging of the disc. Canal stenosis with AP diameter in the midline 6 mm. Effacement of the subarachnoid space and slight indentation of the cord. Moderate bilateral foraminal narrowing. C4-5: Endplate osteophytes and protrusion of the disc more prominent towards the left. Effacement of the  subarachnoid space and cord deformity, worse on the left. AP diameter of the canal 5.9 mm. Bilateral foraminal stenosis. C5-6: Spondylosis with endplate osteophytes and bulging of the disc. Canal narrowing with AP diameter in the midline 8.3 mm. No compressive effect upon the cord. Bilateral foraminal narrowing. C6-7: Mild bulging of the disc. No canal stenosis. Foraminal narrowing on the right that could affect the exiting C7 nerve. C7-T1: Facet osteoarthritis with 1 mm of anterolisthesis. No stenosis of the canal or foramina. 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 AP diameter of the canal at C3-4 6 mm. AP diameter of the spinal canal at C4-5 5.9 mm. Effacement  of the subarachnoid space with some indentation of the cord. No abnormal cord T2 signal however. Foraminal stenosis that could cause neural compression on either side at C3-4, either side at C4-5, either side at C5-6 and on the right C6-7. Electronically Signed   By: Nelson Chimes M.D.   On: 09/19/2020 18:01   MR THORACIC SPINE W WO CONTRAST  Result Date: 09/19/2020 CLINICAL DATA:  Acute or progressive myelopathy. EXAM: MRI THORACIC WITHOUT AND WITH CONTRAST TECHNIQUE: Multiplanar and multiecho pulse sequences of the thoracic spine were obtained without and with intravenous contrast. CONTRAST:  76mL GADAVIST GADOBUTROL 1 MMOL/ML IV SOLN COMPARISON:  Cervical study same day FINDINGS: Alignment:  Normal Vertebrae: No fracture or focal bone lesion. Cord: No cord compression or primary cord lesion. Spinal canal widely patent throughout the region. No evidence of arteriovenous malformation. Paraspinal and other soft tissues: Negative Disc levels: No significant disc pathology in the thoracic region. Wide patency of the canal and foramina. Ordinary mild facet osteoarthritis without edema or enhancement. No encroachment upon the neural spaces. IMPRESSION: Negative thoracic study. No cord compression or cord lesion. No significant degenerative changes. Electronically Signed   By: Nelson Chimes M.D.   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  Today ANC 0.5 -proceed with Granix daily x3-keep ANC greater than 1.5.   #MRSA-lung abscess s/p IV vancomycin-worsening noted question underlying fungal infection-followed by pulmonary.    Cammie Sickle, MD 09/20/2020  8:03 PM

## 2020-09-20 NOTE — TOC Progression Note (Signed)
Transition of Care [facility]) - Progression Note    Patient Details  Name: Marcus [name] MRN: [id number] Date of Birth: Jan 23, 1961  [facility] CM/SW [place], RN Phone Number: 09/20/2020, 10:47 AM  Clinical Narrative:   Patient lives with his mother, who can assist him after discharge.  Patient can get to appointments and get medications without concerns.  Patient reports that he takes medications as directed.    Patient does not use home health services or DME, and he states he has no concerns with going home after discharge.  TOC contact information given to patient.         Expected Discharge Plan and Services                                                 Social Determinants of Health (SDOH) Interventions    Readmission Risk Interventions Readmission Risk Prevention Plan 08/25/2020  Transportation Screening Complete  PCP or Specialist Appt within 3-5 Days Complete  HRI or [place] Complete  Social Work Consult for [place] Planning/Counseling Complete  Palliative Care Screening Complete  Medication Review [employer]) Complete  Some recent data might be hidden

## 2020-09-21 ENCOUNTER — Other Ambulatory Visit (HOSPITAL_COMMUNITY): Payer: Self-pay

## 2020-09-21 ENCOUNTER — Encounter: Payer: Self-pay | Admitting: Internal Medicine

## 2020-09-21 DIAGNOSIS — J441 Chronic obstructive pulmonary disease with (acute) exacerbation: Secondary | ICD-10-CM

## 2020-09-21 DIAGNOSIS — J852 Abscess of lung without pneumonia: Secondary | ICD-10-CM | POA: Diagnosis not present

## 2020-09-21 LAB — CBC WITH DIFFERENTIAL/PLATELET
Abs Immature Granulocytes: 0.2 10*3/uL — ABNORMAL HIGH (ref 0.00–0.07)
Basophils Absolute: 0.1 10*3/uL (ref 0.0–0.1)
Basophils Relative: 1 %
Eosinophils Absolute: 0 10*3/uL (ref 0.0–0.5)
Eosinophils Relative: 0 %
HCT: 31.9 % — ABNORMAL LOW (ref 39.0–52.0)
Hemoglobin: 10.1 g/dL — ABNORMAL LOW (ref 13.0–17.0)
Immature Granulocytes: 2 %
Lymphocytes Relative: 8 %
Lymphs Abs: 0.7 10*3/uL (ref 0.7–4.0)
MCH: 27.6 pg (ref 26.0–34.0)
MCHC: 31.7 g/dL (ref 30.0–36.0)
MCV: 87.2 fL (ref 80.0–100.0)
Monocytes Absolute: 1.5 10*3/uL — ABNORMAL HIGH (ref 0.1–1.0)
Monocytes Relative: 18 %
Neutro Abs: 5.9 10*3/uL (ref 1.7–7.7)
Neutrophils Relative %: 71 %
Platelets: 238 10*3/uL (ref 150–400)
RBC: 3.66 MIL/uL — ABNORMAL LOW (ref 4.22–5.81)
RDW: 18.6 % — ABNORMAL HIGH (ref 11.5–15.5)
Smear Review: NORMAL
WBC: 8.3 10*3/uL (ref 4.0–10.5)
nRBC: 0.6 % — ABNORMAL HIGH (ref 0.0–0.2)

## 2020-09-21 LAB — FUNGITELL, SERUM: Fungitell Result: 38 pg/mL (ref <80)

## 2020-09-21 LAB — ASPERGILLUS ANTIGEN, BAL/SERUM: Aspergillus Ag, BAL/Serum: 0.02 Index (ref 0.00–0.49)

## 2020-09-21 NOTE — Progress Notes (Signed)
Physical Therapy Treatment Patient Details Name: Marcus [name] MRN: [id number] DOB: 05-03-1960 Today's Date: 09/21/2020    History of Present Illness 60 y/o M with PMH: tobacco use d/o, RA, pancytopenia, COPD (on chronic 2Lnc), and Felty syndrome who presented to ED with c/o SOB and cough x1 month.  He was here last month with similar and returns with increased coughing with bright red blood production. CT scan showed cavitary lesion in the right upper lobe.  Suspect lung abscess. Pulminology following. h/o hypothermic during hospitalizations (thought to be 2/2 relative adrenal insufficiency.)    PT Comments    Pt seen for skilled PT treatment. Pt on 4L/min supplemental O2 via nasal cannula, briefly turned up to 5L/min with gait but able to titrate back down to 4L/min. Pt with brief reading of SPO2 of 79% but then lost reading & when re-found SpO2 >90% so question accuracy of prior reading, otherwise lowest reading 86% but pt able to recover to >/= 90% within seconds. Pt is able to increase gait distances with RW & CGA but requires seated rest breaks between gait trials 2/2 fatigue. Pt is able to perform BLE LAQ for strengthening. Continue to recommend STR upon d/c to maximize independence with functional mobility & reduce fall risk as pt lives with elderly mother & splits household duties with her & at this time he requires assistance himself. Will continue to follow acutely to address balance, endurance, & gait.     Follow Up Recommendations  SNF     Equipment Recommendations  Rolling walker with 5" wheels;3in1 (PT)    Recommendations for Other Services       Precautions / Restrictions Precautions Precautions: Fall Restrictions Weight Bearing Restrictions: No    Mobility  Bed Mobility Overal bed mobility: Needs Assistance Bed Mobility: Supine to Sit   Sidelying to sit: Supervision;HOB elevated            Transfers Overall transfer level: Needs assistance Equipment used:  Rolling walker (2 wheeled) Transfers: Sit to/from Stand Sit to Stand: Supervision            Ambulation/Gait Ambulation/Gait assistance: Min guard Gait Distance (Feet): 100 Feet (+ 85 ft) Assistive device: Rolling walker (2 wheeled) Gait Pattern/deviations: Decreased step length - right;Decreased step length - left;Decreased stride length Gait velocity: decreased   General Gait Details: decreased awareness of ability to side step laterally with RW in small spaces with PT educating him on this   Stairs             Wheelchair Mobility    Modified Rankin (Stroke Patients Only)       Balance Overall balance assessment: Needs assistance Sitting-balance support: Bilateral upper extremity supported;Feet supported Sitting balance-Leahy Scale: Good     Standing balance support: Bilateral upper extremity supported;During functional activity Standing balance-Leahy Scale: Fair Standing balance comment: BUE support on RW                            Cognition Arousal/Alertness: Awake/alert Behavior During Therapy: Flat affect Overall Cognitive Status: Within Functional Limits for tasks assessed                                        Exercises General Exercises - Lower Extremity Long Arc Quad: AROM;Strengthening;10 reps;Both;Seated    General Comments        Pertinent Vitals/Pain Pain  Assessment: No/denies pain    Home Living                      Prior Function            PT Goals (current goals can now be found in the care plan section) Acute Rehab PT Goals Patient Stated Goal: home PT Goal Formulation: With patient Time For Goal Achievement: 10/05/20 Potential to Achieve Goals: Fair Progress towards PT goals: Progressing toward goals    Frequency    Min 2X/week      PT Plan Current plan remains appropriate    Co-evaluation              AM-PAC PT "6 Clicks" Mobility   Outcome Measure  Help needed  turning from your back to your side while in a flat bed without using bedrails?: None Help needed moving from lying on your back to sitting on the side of a flat bed without using bedrails?: A Little Help needed moving to and from a bed to a chair (including a wheelchair)?: A Little Help needed standing up from a chair using your arms (e.g., wheelchair or bedside chair)?: None Help needed to walk in hospital room?: A Little Help needed climbing 3-5 steps with a railing? : A Lot 6 Click Score: 19    End of Session Equipment Utilized During Treatment: Gait belt;Oxygen Activity Tolerance: Patient tolerated treatment well Patient left: in chair;with call bell/phone within reach;with family/visitor present Nurse Communication: Mobility status (O2) PT Visit Diagnosis: Unsteadiness on feet (R26.81);Muscle weakness (generalized) (M62.81);Difficulty in walking, not elsewhere classified (R26.2)     Time: 8811-0315 PT Time Calculation (min) (ACUTE ONLY): 23 min  Charges:  $Therapeutic Activity: 23-37 mins                     Lavone Nian, PT, DPT 09/21/20, 3:37 PM    Waunita Schooner 09/21/2020, 3:35 PM

## 2020-09-21 NOTE — Progress Notes (Signed)
[place] [facility] PROGRESS NOTE    Marcus [name]  HYW:[id number] DOB: October 14, 1960 DOA: 09/17/2020 PCP: [name], FNP    Brief Narrative:  Marcus [name] is a 60 y.o. M with RA c/b Felty syndrome/neutropenia, smoking, and COPD as well as recently diagnosed MRSA abscess who presents with failure of outpatient treamtent.  Patient admitted last month for 8 days for cough and shortness of breath as well as hemoptysis found to have a large cavitary lesion in the right upper lobe concerning for lung abscess.  Sputum was positive for MRSA and so he was discharged on 6 weeks of oral doxycycline.  He reports adherence to his medication after some initial improvement with IV vancomycin in the hospital, he then started to get persistently worse, finally was coughing up blood and had chest pain and malaise/fatigue, so he came to the ER.  He was started on linezolid by ID, continue to have some hemoptysis, pulmonology is recommending bronchoscopy and a possible surgery.  EEG was negative for any seizure-like activity.  Imaging with concern of spinal stenosis but no emergent need for any intervention.  Neurology is recommending outpatient neurosurgery evaluation once improved from current hospitalization.  If patient developed any incontinence or worsening focal weakness then neurosurgery should be consulted urgently.  Patient was started on Granix due to worsening neutropenia , with resulted improvement.  Assessment & Plan:  Acute on chronic respiratory failure with hypoxia At baseline he is on 2 L.  Currently on 3 to 4 L of oxygen. -Continue with incentive spirometry and flutter valve -Continue with supplemental oxygen  Necrotizing pneumonia causing sepsis Presented with tachycardia, tachypnea, leukopenia, and blood pressure less than 100. Initially received vancomycin, cefepime and Flagyl later transitioned to linezolid by ID Pulmonology is recommending bronchoscopy, multiple labs  including Fungitell is pending, concern of aspergilloma, might need bronchoscopy or a surgery. -Continue with linezolid  Neutropenia.  Noted during last hospitalization.  Little worsening of leukopenia and neutropenia. -Patient was started on Granix by hematology yesterday with resulted improvement in cell counts today. 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  Resolved -Replete potassium as needed and monitor  Shakiness.  There was some witnessed shakiness, neurology was consulted for concern of seizure-like activity.  No shakiness or any other seizure noted.  EEG normal. Neurology signed off and they are recommending outpatient neurosurgery evaluation for some spinal stenosis, no emergent need.  If patient developed any new focal weakness or incontinence then they can be reconsulted and at that point he will need emergent neurosurgery evaluation. -Continue to monitor  Chronic diastolic CHF Euvolemic  Intussusception This is an apparently incidental finding, no evidence of small bowel obstruction, belly exam benign - Advance diet as tolerated -Stop PPI   Anemia of chronic disease Hgb stable with some improvement at 10.1 today in setting of mild hemoptysis -Trend Hgb -Monitor for ongoing bleeding  Hyponatremia Mild, asymptoamtic   Smoking Cessation recommended, modalities discussed  Severe protein calorie malnutrition As evidenced by severe loss of subcutaneous muscle mass and fat, chronic infection, smoking -Consutl dietitian  Disposition: Status is: Inpatient  Remains inpatient appropriate because: protect  failed outpatient therapy, still on higher than baseline O2, requires ongoing diagnostic testing perhaps as dictated by Pulmonogloy  Dispo: The patient is from: Home              Anticipated d/c is to: SNF  Patient currently is not medically stable to d/c.   Difficult  to place patient No              Level of care: Med-Surg  MDM: The below labs and imaging reports were reviewed and summarized above.  Medication management as above.  This is a severe threat to life and bodily function    DVT prophylaxis:   SCDs.  Code Status: Full code Family Communication: Brother was updated at bedside  Subjective: Patient seems improving very slowly, cough and hemoptysis continue to improve.  Started eating.  Objective: Vitals:   09/21/20 0542 09/21/20 0828 09/21/20 1206 09/21/20 1626  BP: 136/86 115/80 102/61 93/60  Pulse: 69 65 73 94  Resp: _0 Temp: (!) 97.5 F (36.4 C) (!) 97.4 F (36.3 C) 97.6 F (36.4 C) (!) 97.5 F (36.4 C)  TempSrc: Oral Oral Oral Oral  SpO2: 98% 100% 98% 97%  Weight:        Intake/Output Summary (Last 24 hours) at 09/21/2020 1811 Last data filed at 09/21/2020 1435 Gross per 24 hour  Intake 898 ml  Output 4325 ml  Net -3427 ml    Filed Weights   09/19/20 1000 09/21/20 0428  Weight: 63.7 kg 60.1 kg    Examination:  General.  Chronically ill-appearing, frail gentleman, in no acute distress. Pulmonary.  Decreased breath sounds at bases, normal respiratory effort. CV.  Regular rate and rhythm, no JVD, rub or murmur. Abdomen.  Soft, nontender, nondistended, BS positive. CNS.  Alert and oriented x3.  No focal neurologic deficit. Extremities.  No edema, no cyanosis, pulses intact and symmetrical. Psychiatry.  Judgment and insight appears normal.   Data Reviewed: I have personally reviewed following labs and imaging studies:  CBC: Recent Labs  Lab 09/17/20 1345 09/18/20 0533 09/19/20 0506 09/20/20 0443 09/21/20 0405  WBC 3.1* 4.9 3.7* 1.5* 8.3  NEUTROABS 1.5*  --  2.5 0.5* 5.9  HGB 12.9* 9.8* 9.8* 10.0* 10.1*  HCT 40.2 29.7* 29.0* 31.3* 31.9*  MCV 84.1 83.4 82.4 84.8 87.2  PLT 190 203 256 262 209    Basic Metabolic Panel: Recent Labs  Lab 09/17/20 1345 09/18/20 0533 09/19/20 0506 09/20/20 0443   NA 131* 131* 136 138  K 3.3* 3.6 3.4* 3.9  CL 96* 104 103 101  CO2 _1 GLUCOSE 137* 72 163* 132*  BUN 33* _2 CREATININE 0.78 0.48* 0.48* 0.34*  CALCIUM 7.9* 7.2* 7.8* 8.2*  MG  --   --  1.8 2.0    GFR: Estimated Creatinine Clearance: 84.5 mL/min (A) (by C-G formula based on SCr of 0.34 mg/dL (L)). Liver Function Tests: Recent Labs  Lab 09/17/20 1345 09/18/20 0533  AST 21 15  ALT 12 9  ALKPHOS 276* 168*  BILITOT 1.0 0.8  PROT 6.7 5.0*  ALBUMIN 2.4* 1.8*    No results for input(s): LIPASE, AMYLASE in the last 168 hours. No results for input(s): AMMONIA in the last 168 hours. Coagulation Profile: Recent Labs  Lab 09/17/20 1345  INR 1.1    Cardiac Enzymes: No results for input(s): CKTOTAL, CKMB, CKMBINDEX, TROPONINI in the last 168 hours. BNP (last 3 results) No results for input(s): PROBNP in the last 8760 hours. HbA1C: No results for input(s): HGBA1C in the last 72 hours. CBG: No results for input(s): GLUCAP in the last 168 hours. Lipid Profile: No results for input(s): CHOL, HDL, LDLCALC, TRIG, CHOLHDL, LDLDIRECT in the last 72 hours.  Thyroid Function Tests: No results for input(s): TSH, T4TOTAL, FREET4, T3FREE, THYROIDAB in the last 72 hours. Anemia Panel: No results for input(s): VITAMINB12, FOLATE, FERRITIN, TIBC, IRON, RETICCTPCT in the last 72 hours. Urine analysis:    Component Value Date/Time   COLORURINE AMBER (A) 08/22/2020 1253   APPEARANCEUR HAZY (A) 08/22/2020 1253   LABSPEC 1.024 08/22/2020 1253   PHURINE 5.0 08/22/2020 [place] 08/22/2020 1253   HGBUR NEGATIVE 08/22/2020 [place] 08/22/2020 1253   KETONESUR NEGATIVE 08/22/2020 1253   PROTEINUR NEGATIVE 08/22/2020 1253   UROBILINOGEN 0.2 10/21/2012 1737   NITRITE NEGATIVE 08/22/2020 1253   LEUKOCYTESUR NEGATIVE 08/22/2020 1253   Sepsis Labs: _0 (procalcitonin:4,lacticacidven:4)  ) Recent Results (from the past 240 hour(s))   Blood culture (routine x 2)     Status: None (Preliminary result)   Collection Time: 09/17/20  1:58 PM   Specimen: BLOOD  Result Value Ref Range Status   Specimen Description BLOOD BLOOD LEFT FOREARM  Final   Special Requests   Final    BOTTLES DRAWN AEROBIC AND ANAEROBIC Blood Culture adequate volume   Culture   Final    NO GROWTH 3 DAYS Performed at [facility], [address] [place], [place] [address]    Report Status PENDING  Incomplete  Blood culture (routine x 2)     Status: None (Preliminary result)   Collection Time: 09/17/20  1:59 PM   Specimen: BLOOD  Result Value Ref Range Status   Specimen Description BLOOD BLOOD RIGHT FOREARM  Final   Special Requests   Final    BOTTLES DRAWN AEROBIC AND ANAEROBIC Blood Culture results may not be optimal due to an excessive volume of blood received in culture bottles   Culture   Final    NO GROWTH 3 DAYS Performed at [facility], [address], [place], [place] [address]    Report Status PENDING  Incomplete  Resp Panel by RT-PCR (Flu A&B, Covid) Nasopharyngeal Swab     Status: None   Collection Time: 09/17/20  2:14 PM   Specimen: Nasopharyngeal Swab; Nasopharyngeal(NP) swabs in vial transport medium  Result Value Ref Range Status   SARS Coronavirus 2 by RT PCR NEGATIVE NEGATIVE Final    Comment: (NOTE) SARS-CoV-2 target nucleic acids are NOT DETECTED.  The SARS-CoV-2 RNA is generally detectable in upper respiratory specimens during the acute phase of infection. The lowest concentration of SARS-CoV-2 viral copies this assay can detect is 138 copies/mL. A negative result does not preclude SARS-Cov-2 infection and should not be used as the sole basis for treatment or other patient management decisions. A negative result may occur with  improper specimen collection/handling, submission of specimen other than nasopharyngeal swab, presence of viral mutation(s) within the areas targeted by this assay, and  inadequate number of viral copies(<138 copies/mL). A negative result must be combined with clinical observations, patient history, and epidemiological information. The expected result is Negative.  Fact Sheet for Patients:  EntrepreneurPulse.com.au  Fact Sheet for Healthcare Providers:  IncredibleEmployment.be  This test is no t yet approved or cleared by the [place] FDA and  has been authorized for detection and/or diagnosis of SARS-CoV-2 by FDA under an Emergency Use Authorization (EUA). This EUA will remain  in effect (meaning this test can be used) for the duration of the COVID-19 declaration under Section 564(b)(1) of the Act, 21 U.S.C.section 360bbb-3(b)(1), unless the authorization is terminated  or revoked sooner.  Influenza A by PCR NEGATIVE NEGATIVE Final   Influenza B by PCR NEGATIVE NEGATIVE Final    Comment: (NOTE) The Xpert Xpress SARS-CoV-2/FLU/RSV plus assay is intended as an aid in the diagnosis of influenza from Nasopharyngeal swab specimens and should not be used as a sole basis for treatment. Nasal washings and aspirates are unacceptable for Xpert Xpress SARS-CoV-2/FLU/RSV testing.  Fact Sheet for Patients: EntrepreneurPulse.com.au  Fact Sheet for Healthcare Providers: IncredibleEmployment.be  This test is not yet approved or cleared by the [place] FDA and has been authorized for detection and/or diagnosis of SARS-CoV-2 by FDA under an Emergency Use Authorization (EUA). This EUA will remain in effect (meaning this test can be used) for the duration of the COVID-19 declaration under Section 564(b)(1) of the Act, 21 U.S.C. section 360bbb-3(b)(1), unless the authorization is terminated or revoked.  Performed at [facility], [address] [place], [place] [address]   MRSA PCR Screening     Status: Abnormal   Collection Time: 09/18/20  8:36 AM   Specimen:  Nasopharyngeal  Result Value Ref Range Status   MRSA by PCR POSITIVE (A) NEGATIVE Final    Comment:        The GeneXpert MRSA Assay (FDA approved for NASAL specimens only), is one component of a comprehensive MRSA colonization surveillance program. It is not intended to diagnose MRSA infection nor to guide or monitor treatment for MRSA infections. RESULT CALLED TO, READ BACK BY AND VERIFIED WITH: Leota Jacobsen 1029 09/18/20 GM Performed at [facility], [address] [place], [place] [address]   Aspergillus Ag, BAL/Serum     Status: None   Collection Time: 09/19/20  5:05 AM   Specimen: Vein; Blood  Result Value Ref Range Status   Aspergillus Ag, BAL/Serum 0.02 0.00 - 0.49 Index Final    Comment: (NOTE) Performed At: [facility] [place], [place] [id number] Rush Farmer MD [id number]   Expectorated Sputum Assessment w Gram Stain, Rflx to Resp Cult     Status: None   Collection Time: 09/20/20  5:33 AM   Specimen: Expectorated Sputum  Result Value Ref Range Status   Specimen Description EXPECTORATED SPUTUM  Final   Special Requests NONE  Final   Sputum evaluation   Final    THIS SPECIMEN IS ACCEPTABLE FOR SPUTUM CULTURE Performed at [facility], [address] [place], [place] [address]    Report Status 09/20/2020 FINAL  Final  Culture, Respiratory w Gram Stain     Status: None (Preliminary result)   Collection Time: 09/20/20  5:33 AM  Result Value Ref Range Status   Specimen Description   Final    EXPECTORATED SPUTUM Performed at [facility], [address], [place], [place] [address]    Special Requests   Final    NONE Reflexed from [phone] Performed at [facility], [address] [place], [place] [address]    Gram Stain   Final    ABUNDANT WBC PRESENT,BOTH PMN AND MONONUCLEAR FEW GRAM POSITIVE COCCI IN PAIRS IN CLUSTERS    Culture   Final    MODERATE STAPHYLOCOCCUS AUREUS CULTURE  REINCUBATED FOR BETTER GROWTH Performed at [facility], [place] [address] [place], [place] [address]    Report Status PENDING  Incomplete  Anaerobic culture w Gram Stain     Status: None (Preliminary result)   Collection Time: 09/20/20  5:33 AM   Specimen: Expectorated Sputum  Result Value Ref Range Status  Specimen Description   Final    EXPECTORATED SPUTUM Performed at [facility], [address], [place], [place] [address]    Special Requests   Final    Normal Performed at [facility], [place], [place] [address]    Gram Stain   Final    RARE WBC PRESENT,BOTH PMN AND MONONUCLEAR RARE GRAM POSITIVE COCCI IN PAIRS IN CLUSTERS Performed at [facility], [place] [address], [place], [place] [address]    Culture PENDING  Incomplete   Report Status PENDING  Incomplete  Culture, fungus without smear     Status: None (Preliminary result)   Collection Time: 09/20/20  5:33 AM   Specimen: Expectorated Sputum  Result Value Ref Range Status   Specimen Description   Final    EXPECTORATED SPUTUM Performed at [facility], [address] [place], [place] [address]    Special Requests   Final    Normal Performed at [facility], [address] [place], [place] [address]    Culture   Final    CULTURE REINCUBATED FOR BETTER GROWTH Performed at [facility], [place] [address], [place], [place] [address]    Report Status PENDING  Incomplete  Expectorated Sputum Assessment w Gram Stain, Rflx to Resp Cult     Status: None   Collection Time: 09/20/20  9:13 AM   Specimen: Sputum  Result Value Ref Range Status   Specimen Description SPUTUM  Final   Special Requests Normal  Final   Sputum evaluation   Final    THIS SPECIMEN IS ACCEPTABLE FOR SPUTUM CULTURE Performed at [facility], [address], [place], [place] [address]    Report Status 09/20/2020 FINAL  Final  Culture, Respiratory w Gram Stain     Status:  None (Preliminary result)   Collection Time: 09/20/20  9:13 AM   Specimen: SPU  Result Value Ref Range Status   Specimen Description   Final    SPUTUM Performed at [facility], [address], [place], [place] [address]    Special Requests   Final    Normal Reflexed from [phone] Performed at [facility], [address] [place], [place] [address]    Gram Stain   Final    RARE WBC PRESENT,BOTH PMN AND MONONUCLEAR RARE GRAM POSITIVE COCCI IN PAIRS IN CLUSTERS Performed at [facility], [place] [address], [place], [place] [address]    Culture PENDING  Incomplete   Report Status PENDING  Incomplete         Radiology Studies: No results found.    Scheduled Meds:  Chlorhexidine Gluconate Cloth  6 each Topical Q0600   feeding supplement  237 mL Oral TID BM   guaiFENesin  600 mg Oral BID   linezolid  600 mg Oral Q12H   multivitamin with minerals  1 tablet Oral Daily   mupirocin ointment   Nasal BID   nicotine  21 mg Transdermal Daily   predniSONE  10 mg Oral BID WC   Tbo-filgastrim (GRANIX) SQ  480 mcg Subcutaneous Daily   Continuous Infusions:     LOS: 4 days    Time spent: 30 minutes  This record has been created using Systems analyst. Errors have been sought and corrected,but may not always be located. Such creation errors do not reflect on the standard of care.   Lorella Nimrod, MD [facility] 09/21/2020, 6:11 PM     Please  page though [place] or Epic secure chat:  For [employer], Adult nurse

## 2020-09-21 NOTE — Progress Notes (Signed)
Pulmonary Medicine          Date: 09/21/2020,   MRN# [id number] Marcus [name] [birth year]     AdmissionWeight: 63.7 kg                 CurrentWeight: 60.1 kg      CHIEF COMPLAINT:      HISTORY OF PRESENT ILLNESS   60 yo M w/ hx of COPD, Feltys syndrome (enlarged spleen, low WBC count associated with RA), epigastric hernia and rheumatoid arthritis.  Patient had admission 01/2020 to [facility] [place] with findings of pneumoperitoneum he was intubated during this time, had procedure done with graham patch incision hernia repair s/p exlap. He had MRSA pneumonia 08/2020, he has been on antibiotics with doxycycline on outpatient. He came in to ER with complaints of hemoptysis.  PCCM consultation for additional evaluation and management. Patient reports having visual symptoms and seizure like activity few weeks ago. NEURO w/u on going.    09/19/20- patient seems slightly improved, his microbiology is still in process. Currently on empiric therapy.  MRSA is +.  Hemoptysis has slowed down.   09/20/20- MRSA + pneumonia/abscess, now on linozolid, bronch to r/o involvement (aspergillosis.vs malignancy etc)t, radiography has failed prior appropriate therapy.   09/21/20 - still spitting up blood. Breathing no worse   Legionella Pneumo Total Ab 0.00 - 0.90 OD ratio <0.91   Comment: (NOTE)                                Negative          <0.91                                Equivocal   0.91 - 1.09                                Positive          >1.09           This assay detects IgG/IgM/IgA antibodies to     Aspergillus Ag, BAL/Serum 0.00 - 0.49 Index 0.02    Past Medical History:  Diagnosis Date  . COPD (chronic obstructive pulmonary disease) ([place])   . Felty syndrome ([place])   . Hernia, epigastric   . Pancytopenia ([place])   . Seropositive rheumatoid arthritis ([place])   . Tobacco use disorder      SURGICAL HISTORY   Past Surgical History:  Procedure Laterality Date  . INCISIONAL  HERNIA REPAIR  01/17/2020   Procedure: HERNIA REPAIR INCISIONAL AND Silvestre Gunner;  Surgeon: Kinsinger, Arta Bruce, MD;  Location: [facility];  Service: General;;  . LAPAROTOMY N/A 01/17/2020   Procedure: EXPLORATORY LAPAROTOMY;  Surgeon: Kieth Brightly Arta Bruce, MD;  Location: [facility];  Service: General;  Laterality: N/A;     FAMILY HISTORY   Family History  Problem Relation Age of Onset  . CAD Brother   . COPD Brother      SOCIAL HISTORY   Social History   Tobacco Use  . Smoking status: Every Day    Packs/day: 1.00    Pack years: 0.00    Types: Cigarettes  . Smokeless tobacco: Never  Substance Use Topics  . Alcohol use: No    Alcohol/week: 0.0 standard drinks  . Drug use: Yes  Types: Marijuana     MEDICATIONS    Home Medication:    Current Medication:  Current Facility-Administered Medications:  .  acetaminophen (TYLENOL) tablet 650 mg, 650 mg, Oral, Q6H PRN **OR** acetaminophen (TYLENOL) suppository 650 mg, 650 mg, Rectal, Q6H PRN, Para Skeans, MD .  benzonatate (TESSALON) capsule 200 mg, 200 mg, Oral, BID PRN, Athena Masse, MD, 200 mg at 09/21/20 0858 .  Chlorhexidine Gluconate Cloth 2 % PADS 6 each, 6 each, Topical, Q0600, Lorella Nimrod, MD, 6 each at 09/21/20 0429 .  chlorpheniramine-HYDROcodone (TUSSIONEX) 10-8 MG/5ML suspension 5 mL, 5 mL, Oral, Q12H PRN, Athena Masse, MD, 5 mL at 09/20/20 2334 .  feeding supplement (ENSURE ENLIVE / ENSURE PLUS) liquid 237 mL, 237 mL, Oral, TID BM, Lorella Nimrod, MD, 237 mL at 09/21/20 0901 .  guaiFENesin (MUCINEX) 12 hr tablet 600 mg, 600 mg, Oral, BID, Mansy, Jan A, MD, 600 mg at 09/21/20 0858 .  ipratropium-albuterol (DUONEB) 0.5-2.5 (3) MG/3ML nebulizer solution 3 mL, 3 mL, Nebulization, Q4H PRN, Athena Masse, MD, 3 mL at 09/18/20 2208 .  linezolid (ZYVOX) tablet 600 mg, 600 mg, Oral, Q12H, Ravishankar, Jayashree, MD, 600 mg at 09/21/20 0859 .  multivitamin with minerals tablet 1 tablet, 1 tablet, Oral, Daily, Lorella Nimrod, MD, 1 tablet at 09/21/20 0858 .  mupirocin ointment (BACTROBAN) 2 %, , Nasal, BID, Lorella Nimrod, MD, Given at 09/21/20 0900 .  nicotine (NICODERM CQ - dosed in mg/24 hours) patch 21 mg, 21 mg, Transdermal, Daily, Florina Ou V, MD, 21 mg at 09/21/20 0900 .  predniSONE (DELTASONE) tablet 10 mg, 10 mg, Oral, BID WC, Para Skeans, MD, 10 mg at 09/21/20 0858 .  Tbo-Filgrastim (GRANIX) injection 480 mcg, 480 mcg, Subcutaneous, Daily, Rogue Bussing, Govinda R, MD, 480 mcg at 09/21/20 0900    ALLERGIES   Levaquin [levofloxacin in d5w] and Methotrexate     REVIEW OF SYSTEMS    Review of Systems:  Gen:  Denies  fever, sweats, chills weigh loss  HEENT: Denies blurred vision, double vision, ear pain, eye pain, hearing loss, nose bleeds, sore throat Cardiac:  No dizziness, chest pain or heaviness, chest tightness,edema Resp:   Denies cough or sputum porduction, shortness of breath,wheezing, ++hemoptysis,  Gi: Denies swallowing difficulty, stomach pain, nausea or vomiting, diarrhea, constipation, bowel incontinence Gu:  Denies bladder incontinence, burning urine Ext:   Denies Joint pain, stiffness or swelling Skin: Denies  skin rash, easy bruising or bleeding or hives Endoc:  Denies polyuria, polydipsia , polyphagia or weight change Psych:   Denies depression, insomnia or hallucinations   Other:  All other systems negative   VS: BP 115/80 (BP Location: Right Arm)   Pulse 65   Temp (!) 97.4 F (36.3 C) (Oral)   Resp 18   Wt 60.1 kg   SpO2 100%   BMI 21.40 kg/m      PHYSICAL EXAM    GENERAL:NAD, no fevers, chills, no weakness no fatigue HEAD: Normocephalic, atraumatic.  EYES: Pupils equal, round, reactive to light. Extraocular muscles intact. No scleral icterus.  MOUTH: Moist mucosal membrane. Dentition intact. No abscess noted.  EAR, NOSE, THROAT: Clear without exudates. No external lesions.  NECK: Supple. No thyromegaly. No nodules. No JVD.  PULMONARY: Diffuse coarse  rhonchi right sided +wheezes CARDIOVASCULAR: S1 and S2. Regular rate and rhythm. No murmurs, rubs, or gallops. No edema. Pedal pulses 2+ bilaterally.  GASTROINTESTINAL: Soft, nontender, nondistended. No masses. Positive bowel sounds. No hepatosplenomegaly.  MUSCULOSKELETAL: No swelling,  clubbing, or edema. Range of motion full in all extremities.  NEUROLOGIC: Cranial nerves II through XII are intact. No gross focal neurological deficits. Sensation intact. Reflexes intact.  SKIN: No ulceration, lesions, rashes, or cyanosis. Skin warm and dry. Turgor intact.  PSYCHIATRIC: Mood, affect within normal limits. The patient is awake, alert and oriented x 3. Insight, judgment intact.       IMAGING    DG Chest 2 View  Result Date: 09/06/2020 CLINICAL DATA:  Shortness of breath.  Cough. EXAM: CHEST - 2 VIEW COMPARISON:  Chest x-ray 08/28/2020.  CT 08/22/2020. FINDINGS: Mediastinum unremarkable. Heart size normal. Cavitary process in the right mid lung again noted. No change identified. No pleural effusion or pneumothorax. Old right rib fractures. IMPRESSION: Cavitary process in the right mid lung again noted. Cavitary process best identified by prior CT. Chest is unchanged from prior exam. Electronically Signed   By: Marcello Moores  Register   On: 09/06/2020 14:04   CT Chest Wo Contrast  Result Date: 08/22/2020 CLINICAL DATA:  Abnormal x-ray. Interstitial infiltrates with possible right perihilar cavitation. Cough and shortness of breath. EXAM: CT CHEST WITHOUT CONTRAST TECHNIQUE: Multidetector CT imaging of the chest was performed following the standard protocol without IV contrast. COMPARISON:  Radiographs 08/22/2020 and 08/04/2020.  CT 06/10/2020. FINDINGS: Cardiovascular: Extensive three-vessel coronary artery atherosclerosis with lesser involvement of the aorta and great vessels. The heart size is normal. There is no pericardial effusion. Mediastinum/Nodes: Multiple small mediastinal and hilar lymph nodes have  enlarged compared with the chest CT of 2 months ago, likely reactive. No axillary lymphadenopathy. The thyroid gland, trachea and esophagus demonstrate no significant findings. Lungs/Pleura: There is no pleural effusion or pneumothorax. Moderate centrilobular and paraseptal emphysema. As seen on the earlier radiographs, there is a new cavitary mass inferomedially in the right upper lobe, measuring up to 7.8 x 5.5 cm on image 79/4. This has irregular thick walls and associated air-fluid levels. There was no abnormality in this area on the relatively recent prior chest CT, and this is most consistent with cavitating pneumonia. There is an additional cavitary lesion with an air-fluid level medially in the left upper lobe, measuring up to 2.4 cm on image 65/4. There is mild patchy airspace disease surrounding these cavitary infiltrates. Upper abdomen: Small calcified gallstones. No evidence of biliary dilatation or adrenal mass. Postsurgical changes in the anterior abdominal wall consistent with previous hernia repair. Musculoskeletal/Chest wall: There is no chest wall mass or suspicious osseous finding. There are old fractures of the right 5th and 6th ribs posterolaterally which appear unchanged. IMPRESSION: 1. CT confirms the presence of a cavitary infiltrate in the right upper lobe and shows a smaller area of cavitation anteriorly in the left upper lobe. No abnormalities were present in these areas on CT performed 2 months ago to suggest cavitary neoplasm, and these findings are consistent with cavitating/necrotizing pneumonia. 2. Mildly enlarged mediastinal and hilar lymph nodes, likely reactive. 3. No associated pleural disease. 4. Cholelithiasis. 5. Underlying moderate Emphysema (ICD10-J43.9). 6. Coronary and Aortic Atherosclerosis (ICD10-I70.0). Electronically Signed   By: Richardean Sale M.D.   On: 08/22/2020 14:26   CT Angio Chest PE W and/or Wo Contrast  Result Date: 09/17/2020 CLINICAL DATA:  Hemoptysis,  shortness of breath, abdominal distension EXAM: CT ANGIOGRAPHY CHEST CT ABDOMEN AND PELVIS WITH CONTRAST TECHNIQUE: Multidetector CT imaging of the chest was performed using the standard protocol during bolus administration of intravenous contrast. Multiplanar CT image reconstructions and MIPs were obtained to evaluate the vascular anatomy.  Multidetector CT imaging of the abdomen and pelvis was performed using the standard protocol during bolus administration of intravenous contrast. CONTRAST:  27m OMNIPAQUE IOHEXOL 350 MG/ML SOLN COMPARISON:  CT 06/20/2020, 06/22/2020 FINDINGS: CTA CHEST FINDINGS Cardiovascular: Satisfactory opacification of the pulmonary arteries to the segmental level. No evidence of pulmonary embolism. Thoracic aorta is normal in course and caliber. Atherosclerotic calcifications of the aorta and coronary arteries. Normal heart size. No pericardial effusion. Mediastinum/Nodes: Mildly prominent mediastinal lymph nodes are again noted, likely reactive. No axillary or hilar lymphadenopathy. Lungs/Pleura: Redemonstrated cavitary mass located within the inferomedial aspect of the right upper lobe measuring approximately 9.9 x 6.6 x 7.4 cm (previously measured approximately 7.8 x 4.5 x 5.5 cm on 08/22/2020. Increased surrounding consolidation and ground-glass opacity within the adjacent right upper lobe and right middle lobe previously seen cavitary lesion within the medial aspect of the left upper lobe has resolved with small thin walled pleural cyst remaining (series 6, image 44). Scattered areas of ground-glass opacity within the bilateral lower lobes and upper lobes. Background of moderate emphysema. No pleural effusion or pneumothorax. Musculoskeletal: Remote right sixth and seventh rib fractures. No new or acute osseous findings. No chest wall abnormality. Review of the MIP images confirms the above findings. CT ABDOMEN and PELVIS FINDINGS Hepatobiliary: Cholelithiasis without evidence of  pericholecystic inflammation by CT. Liver within normal limits. No biliary dilatation. Pancreas: Unremarkable. No pancreatic ductal dilatation or surrounding inflammatory changes. Spleen: Prior splenectomy. Adrenals/Urinary Tract: Unremarkable adrenal glands. Kidneys enhance symmetrically without focal lesion, stone, or hydronephrosis. Ureters are nondilated. Urinary bladder appears unremarkable. Stomach/Bowel: Stomach is within normal limits. Appendix appears normal (series 2, image 58). Incidental note of a small bowel-small bowel intussusception within the mid left abdomen (series 2, image 38). No evidence of bowel wall thickening, distention, or inflammatory changes. Vascular/Lymphatic: Fusiform infrarenal abdominal aortic aneurysm measuring up to 3.1 cm, unchanged. Aortoiliac atherosclerosis. No abdominopelvic lymphadenopathy. Reproductive: Prostate is unremarkable. Other: No free fluid. No abdominopelvic fluid collection. No pneumoperitoneum. Fat containing midline supraumbilical hernia without evidence of complication. Musculoskeletal: No acute or significant osseous findings. Review of the MIP images confirms the above findings. IMPRESSION: 1. No evidence of acute pulmonary embolism. 2. Enlarging cavitary mass within the inferomedial aspect of the right upper lobe measuring up to 9.9 cm (previously measured approximately 7.8 x 4.5 x 5.5 cm on 08/22/2020). Increased surrounding consolidation and ground-glass opacity within the adjacent right upper lobe and right middle lobe. Findings remain most compatible with cavitary/necrotizing pneumonia. 3. Scattered areas of ground-glass opacity within the bilateral lower lobes and upper lobes, concerning for multifocal infection. 4. Interval resolution of previously seen cavitary lesion within the medial aspect of the left upper lobe with small thin walled pleural cyst remaining. 5. Cholelithiasis without evidence of pericholecystic inflammation by CT. 6. Incidental  note of a small bowel-small bowel intussusception within the mid left abdomen. No evidence of bowel obstruction. 7. Fat containing midline supraumbilical hernia without evidence of complication. 8. Stable infrarenal abdominal aortic aneurysm measuring up to 3.1 cm. Recommend follow-up ultrasound every 3 years. This recommendation follows ACR consensus guidelines: White Paper of the ACR Incidental Findings Committee II on Vascular Findings. J Am Coll Radiol 2013; 10:789-794. Aortic Atherosclerosis (ICD10-I70.0) and Emphysema (ICD10-J43.9). Electronically Signed   By: NDavina PokeD.O.   On: 09/17/2020 16:38   MR BRAIN W WO CONTRAST  Result Date: 09/18/2020 CLINICAL DATA:  Brain mass or lesion. Additional history provided: 60year old male with history of COPD, Felty syndrome and rheumatoid  arthritis. Recent MRSA pneumonia. Patient reports visual symptoms and seizure-like activity a few weeks ago. EXAM: MRI HEAD WITHOUT AND WITH CONTRAST TECHNIQUE: Multiplanar, multiecho pulse sequences of the brain and surrounding structures were obtained without and with intravenous contrast. CONTRAST:  58m GADAVIST GADOBUTROL 1 MMOL/ML IV SOLN COMPARISON:  No pertinent prior exams available for comparison. FINDINGS: Brain: Intermittently motion degraded examination, limiting evaluation. Most notably, there is moderate/severe motion degradation of the sagittal T1 weighted sequence, moderate/severe motion degradation of the axial T2/FLAIR sequence, moderate motion degradation of the axial T1 weighted postcontrast sequence and moderate motion degradation of the coronal T1 weighted postcontrast sequence. Mild generalized cerebral and cerebellar atrophy. Advanced patchy and confluent T2/FLAIR hyperintensity within the cerebral white matter, nonspecific but most often secondary to chronic small vessel ischemia. Chronic lacunar infarct within the left lentiform nucleus. Small foci of SWI signal loss within the left basal ganglia  and along the margin of the posterior right lateral ventricle, likely reflecting chronic microhemorrhages. Additional subcentimeter focus of SWI signal loss within the midline cerebellum, which may reflect an additional chronic microhemorrhage or cavernoma. There is no acute infarct. No evidence of intracranial mass. No extra-axial fluid collection. No midline shift. Within the limitations of motion degradation, no abnormal intracranial enhancement is identified. Vascular: Expected proximal arterial flow voids. Non dominant intracranial right vertebral artery. Skull and upper cervical spine: Within the limitations of motion degradation, no focal marrow lesion is identified. Sinuses/Orbits: Visualized orbits show no acute finding. 1.9 cm right maxillary sinus mucous retention cyst. Trace left maxillary sinus mucosal thickening. Other: Trace bilateral mastoid effusions. 2.8 cm ovoid lesion within the left suboccipital scalp demonstrating heterogeneous but predominantly T2 hyperintense signal, T1 hypointense signal, restricted diffusion and no convincing abnormal enhancement. This is favored to reflect a sebaceous/epidermoid cyst. IMPRESSION: Motion degraded examination, as described and limiting evaluation. No evidence of acute intracranial abnormality. Severe patchy and confluent T2/FLAIR hyperintensity within the cerebral white matter, nonspecific but most often secondary to chronic small vessel ischemia. Chronic left basal ganglia lacunar infarct. Subcentimeter focus of SWI signal loss within the midline cerebellum, which may reflect a chronic microhemorrhage or cavernoma. Mild generalized parenchymal atrophy. Incidentally noted 1.9 cm right maxillary sinus mucous retention cyst. Trace bilateral mastoid effusions. Electronically Signed   By: KKellie SimmeringDO   On: 09/18/2020 15:59   MR CERVICAL SPINE W WO CONTRAST  Result Date: 09/19/2020 CLINICAL DATA:  Acute or progressive myelopathy. EXAM: MRI CERVICAL SPINE  WITHOUT AND WITH CONTRAST TECHNIQUE: Multiplanar and multiecho pulse sequences of the cervical spine, to include the craniocervical junction and cervicothoracic junction, were obtained without and with intravenous contrast. CONTRAST:  615mGADAVIST GADOBUTROL 1 MMOL/ML IV SOLN COMPARISON:  No previous cervical imaging. FINDINGS: Alignment: Straightening of the normal cervical lordosis. 1 mm degenerative anterolisthesis C7-T1. Vertebrae: No fracture or focal bone lesion. Cord: No primary cord lesion.  See below regarding stenosis. Posterior Fossa, vertebral arteries, paraspinal tissues: Large sebaceous cyst of the upper left neck. Disc levels: Foramen magnum is widely patent.  C1-2 and C2-3 are normal. C3-4: Endplate osteophytes and bulging of the disc. Canal stenosis with AP diameter in the midline 6 mm. Effacement of the subarachnoid space and slight indentation of the cord. Moderate bilateral foraminal narrowing. C4-5: Endplate osteophytes and protrusion of the disc more prominent towards the left. Effacement of the subarachnoid space and cord deformity, worse on the left. AP diameter of the canal 5.9 mm. Bilateral foraminal stenosis. C5-6: Spondylosis with endplate osteophytes and bulging of  the disc. Canal narrowing with AP diameter in the midline 8.3 mm. No compressive effect upon the cord. Bilateral foraminal narrowing. C6-7: Mild bulging of the disc. No canal stenosis. Foraminal narrowing on the right that could affect the exiting C7 nerve. C7-T1: Facet osteoarthritis with 1 mm of anterolisthesis. No stenosis of the canal or foramina. 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 AP diameter of the canal at C3-4 6 mm. AP diameter of the spinal canal at C4-5 5.9 mm. Effacement of the subarachnoid space with some indentation of the cord. No abnormal cord T2 signal however. Foraminal stenosis that could cause neural compression on either side at C3-4, either side at C4-5, either side  at C5-6 and on the right C6-7. Electronically Signed   By: Nelson Chimes M.D.   On: 09/19/2020 18:01   MR THORACIC SPINE W WO CONTRAST  Result Date: 09/19/2020 CLINICAL DATA:  Acute or progressive myelopathy. EXAM: MRI THORACIC WITHOUT AND WITH CONTRAST TECHNIQUE: Multiplanar and multiecho pulse sequences of the thoracic spine were obtained without and with intravenous contrast. CONTRAST:  64m GADAVIST GADOBUTROL 1 MMOL/ML IV SOLN COMPARISON:  Cervical study same day FINDINGS: Alignment:  Normal Vertebrae: No fracture or focal bone lesion. Cord: No cord compression or primary cord lesion. Spinal canal widely patent throughout the region. No evidence of arteriovenous malformation. Paraspinal and other soft tissues: Negative Disc levels: No significant disc pathology in the thoracic region. Wide patency of the canal and foramina. Ordinary mild facet osteoarthritis without edema or enhancement. No encroachment upon the neural spaces. IMPRESSION: Negative thoracic study. No cord compression or cord lesion. No significant degenerative changes. Electronically Signed   By: MNelson ChimesM.D.   On: 09/19/2020 18:03   CT ABDOMEN PELVIS W CONTRAST  Result Date: 09/17/2020 CLINICAL DATA:  Hemoptysis, shortness of breath, abdominal distension EXAM: CT ANGIOGRAPHY CHEST CT ABDOMEN AND PELVIS WITH CONTRAST TECHNIQUE: Multidetector CT imaging of the chest was performed using the standard protocol during bolus administration of intravenous contrast. Multiplanar CT image reconstructions and MIPs were obtained to evaluate the vascular anatomy. Multidetector CT imaging of the abdomen and pelvis was performed using the standard protocol during bolus administration of intravenous contrast. CONTRAST:  715mOMNIPAQUE IOHEXOL 350 MG/ML SOLN COMPARISON:  CT 06/20/2020, 06/22/2020 FINDINGS: CTA CHEST FINDINGS Cardiovascular: Satisfactory opacification of the pulmonary arteries to the segmental level. No evidence of pulmonary embolism.  Thoracic aorta is normal in course and caliber. Atherosclerotic calcifications of the aorta and coronary arteries. Normal heart size. No pericardial effusion. Mediastinum/Nodes: Mildly prominent mediastinal lymph nodes are again noted, likely reactive. No axillary or hilar lymphadenopathy. Lungs/Pleura: Redemonstrated cavitary mass located within the inferomedial aspect of the right upper lobe measuring approximately 9.9 x 6.6 x 7.4 cm (previously measured approximately 7.8 x 4.5 x 5.5 cm on 08/22/2020. Increased surrounding consolidation and ground-glass opacity within the adjacent right upper lobe and right middle lobe previously seen cavitary lesion within the medial aspect of the left upper lobe has resolved with small thin walled pleural cyst remaining (series 6, image 44). Scattered areas of ground-glass opacity within the bilateral lower lobes and upper lobes. Background of moderate emphysema. No pleural effusion or pneumothorax. Musculoskeletal: Remote right sixth and seventh rib fractures. No new or acute osseous findings. No chest wall abnormality. Review of the MIP images confirms the above findings. CT ABDOMEN and PELVIS FINDINGS Hepatobiliary: Cholelithiasis without evidence of pericholecystic inflammation by CT. Liver within normal limits. No biliary dilatation. Pancreas: Unremarkable. No pancreatic  ductal dilatation or surrounding inflammatory changes. Spleen: Prior splenectomy. Adrenals/Urinary Tract: Unremarkable adrenal glands. Kidneys enhance symmetrically without focal lesion, stone, or hydronephrosis. Ureters are nondilated. Urinary bladder appears unremarkable. Stomach/Bowel: Stomach is within normal limits. Appendix appears normal (series 2, image 58). Incidental note of a small bowel-small bowel intussusception within the mid left abdomen (series 2, image 38). No evidence of bowel wall thickening, distention, or inflammatory changes. Vascular/Lymphatic: Fusiform infrarenal abdominal aortic  aneurysm measuring up to 3.1 cm, unchanged. Aortoiliac atherosclerosis. No abdominopelvic lymphadenopathy. Reproductive: Prostate is unremarkable. Other: No free fluid. No abdominopelvic fluid collection. No pneumoperitoneum. Fat containing midline supraumbilical hernia without evidence of complication. Musculoskeletal: No acute or significant osseous findings. Review of the MIP images confirms the above findings. IMPRESSION: 1. No evidence of acute pulmonary embolism. 2. Enlarging cavitary mass within the inferomedial aspect of the right upper lobe measuring up to 9.9 cm (previously measured approximately 7.8 x 4.5 x 5.5 cm on 08/22/2020). Increased surrounding consolidation and ground-glass opacity within the adjacent right upper lobe and right middle lobe. Findings remain most compatible with cavitary/necrotizing pneumonia. 3. Scattered areas of ground-glass opacity within the bilateral lower lobes and upper lobes, concerning for multifocal infection. 4. Interval resolution of previously seen cavitary lesion within the medial aspect of the left upper lobe with small thin walled pleural cyst remaining. 5. Cholelithiasis without evidence of pericholecystic inflammation by CT. 6. Incidental note of a small bowel-small bowel intussusception within the mid left abdomen. No evidence of bowel obstruction. 7. Fat containing midline supraumbilical hernia without evidence of complication. 8. Stable infrarenal abdominal aortic aneurysm measuring up to 3.1 cm. Recommend follow-up ultrasound every 3 years. This recommendation follows ACR consensus guidelines: White Paper of the ACR Incidental Findings Committee II on Vascular Findings. J Am Coll Radiol 2013; 10:789-794. Aortic Atherosclerosis (ICD10-I70.0) and Emphysema (ICD10-J43.9). Electronically Signed   By: Davina Poke D.O.   On: 09/17/2020 16:38   DG Chest Portable 1 View  Result Date: 09/17/2020 CLINICAL DATA:  Shortness of breath and hemoptysis EXAM: PORTABLE  CHEST 1 VIEW COMPARISON:  September 06, 2020 chest radiograph and chest CT Aug 22, 2020 FINDINGS: The previously noted cavitary mass in the right middle lobe anteriorly is again noted. There is increase in consolidation in this area compared to most recent study. Lungs elsewhere clear. Heart size and pulmonary vascular normal. No adenopathy appreciable. No bone lesions. IMPRESSION: Cavitary mass right middle lobe anteriorly with increase in surrounding consolidation compared to most recent study. The lungs elsewhere are clear. Heart size normal. No adenopathy appreciable by radiography. Electronically Signed   By: Lowella Grip III M.D.   On: 09/17/2020 13:54   DG Chest Port 1 View  Result Date: 08/28/2020 CLINICAL DATA:  Hypoxia. EXAM: PORTABLE CHEST 1 VIEW COMPARISON:  CT 08/22/2020.  Chest x-ray 08/22/2020. FINDINGS: Mediastinum hilar structures normal. Heart size normal. Cavitary process in the right mid lung and left upper lung best identified by prior CT. No new findings are noted. No pleural effusion or pneumothorax. Heart size stable. Degenerative change thoracic spine. Old right rib fractures present. IMPRESSION: Cavitary process in the right mid lung and left upper lung best identified by prior CT. Chest is unchanged from prior exam. Electronically Signed   By: Marcello Moores  Register   On: 08/28/2020 08:25   DG Chest Portable 1 View  Result Date: 08/22/2020 CLINICAL DATA:  Shortness of breath EXAM: PORTABLE CHEST 1 VIEW COMPARISON:  August 04, 2020 FINDINGS: There is apparent scarring in the right perihilar  region. There is suggestion of cavitation in this area in the right perihilar region. Slight scarring lateral right base. The lungs elsewhere are clear. Heart size and pulmonary vascularity are normal. No appreciable adenopathy. No bone lesions. IMPRESSION: Scarring right mid lung. Suggestion of developing cavitation in this area which could be due to overlapping of areas of scarring and normal  structures. Given concern for cavitation in this area, noncontrast chest CT advised to further evaluate. Slight scarring lateral right base. Lungs elsewhere are clear. Heart size normal. No adenopathy evident by radiography. Electronically Signed   By: Lowella Grip III M.D.   On: 08/22/2020 13:24   DG Swallowing Func-Speech Pathology  Result Date: 09/06/2020 Please refer to "Notes" tab for Speech Pathology notes.  ECHOCARDIOGRAM COMPLETE  Result Date: 08/28/2020    ECHOCARDIOGRAM REPORT   Patient Name:   DACOTA RUBEN [facility] Date of Exam: 08/28/2020 Medical Rec #:  [id number]    Height:       66.0 in Accession #:    [id number]   Weight:       133.6 lb Date of Birth:  [birth year]     BSA:          1.685 m Patient Age:    39 years     BP:           120/74 mmHg Patient Gender: M            HR:           76 bpm. 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 COPD. Tobacco use disorder.  Sonographer:     Sherrie Sport RDCS (AE) Referring Phys:  [id number] Sharen Hones Diagnosing Phys: Nelva Bush MD  Sonographer Comments: Technically challenging study due to limited acoustic windows. Image acquisition challenging due to COPD. IMPRESSIONS  1. Left ventricular ejection fraction, by estimation, is 55 to 60%. The left ventricle has normal function. Left ventricular endocardial border not optimally defined to evaluate regional wall motion. Left ventricular diastolic parameters are consistent with Grade I diastolic dysfunction (impaired relaxation).  2. Right ventricular systolic function is normal. The right ventricular size is mildly enlarged. Mildly increased right ventricular wall thickness. There is normal pulmonary artery systolic pressure.  3. The mitral valve is degenerative. Trivial mitral valve regurgitation. No evidence of mitral stenosis.  4.  The aortic valve was not well visualized. Aortic valve regurgitation is not visualized. No aortic stenosis is present.  5. Pulmonic valve regurgitation not well assessed.  6. The inferior vena cava is normal in size with greater than 50% respiratory variability, suggesting right atrial pressure of 3 mmHg. Conclusion(s)/Recommendation(s): Valves suboptimally imaged to exclude endocarditis. If clinical concern persists, transesophageal echocardiogram should be considered. FINDINGS  Left Ventricle: Left ventricular ejection fraction, by estimation, is 55 to 60%. The left ventricle has normal function. Left ventricular endocardial border not optimally defined to evaluate regional wall motion. The left ventricular internal cavity size was normal in size. There is no left ventricular hypertrophy. Left ventricular diastolic parameters are consistent with Grade I diastolic dysfunction (impaired relaxation). Right Ventricle: The right ventricular size is mildly enlarged. Mildly increased right ventricular wall thickness. Right ventricular systolic function is normal. There is normal pulmonary artery  systolic pressure. The tricuspid regurgitant velocity is 1.72 m/s, and with an assumed right atrial pressure of 3 mmHg, the estimated right ventricular systolic pressure is 78.2 mmHg. Left Atrium: Left atrial size was normal in size. Right Atrium: Right atrial size was normal in size. Pericardium: There is no evidence of pericardial effusion. Mitral Valve: The mitral valve is degenerative in appearance. There is mild thickening of the mitral valve leaflet(s). There is mild calcification of the mitral valve leaflet(s). Mild mitral annular calcification. Trivial mitral valve regurgitation. No evidence of mitral valve stenosis. Tricuspid Valve: The tricuspid valve is grossly normal. Tricuspid valve regurgitation is trivial. Aortic Valve: The aortic valve was not well visualized. Aortic valve regurgitation is not visualized. No  aortic stenosis is present. Aortic valve mean gradient measures 3.0 mmHg. Aortic valve peak gradient measures 4.8 mmHg. Aortic valve area, by VTI measures 2.04 cm. Pulmonic Valve: The pulmonic valve was not well visualized. Pulmonic valve regurgitation not well assessed. Aorta: The aortic root is normal in size and structure. Pulmonary Artery: The pulmonary artery is not well seen. Venous: The inferior vena cava is normal in size with greater than 50% respiratory variability, suggesting right atrial pressure of 3 mmHg. IAS/Shunts: The interatrial septum was not well visualized.  LEFT VENTRICLE PLAX 2D LVIDd:         5.38 cm  Diastology LVIDs:         2.94 cm  LV e' medial:    6.09 cm/s LV PW:         1.02 cm  LV E/e' medial:  10.3 LV IVS:        0.73 cm  LV e' lateral:   11.40 cm/s LVOT diam:     2.10 cm  LV E/e' lateral: 5.5 LV SV:         45 LV SV Index:   27 LVOT Area:     3.46 cm  RIGHT VENTRICLE RV Basal diam:  4.23 cm RV S prime:     9.46 cm/s TAPSE (M-mode): 2.0 cm LEFT ATRIUM             Index       RIGHT ATRIUM           Index LA diam:        3.50 cm 2.08 cm/m  RA Area:     16.50 cm LA Vol (A2C):   30.0 ml 17.81 ml/m RA Volume:   44.10 ml  26.18 ml/m LA Vol (A4C):   39.3 ml 23.33 ml/m LA Biplane Vol: 35.6 ml 21.13 ml/m  AORTIC VALVE                   PULMONIC VALVE AV Area (Vmax):    1.89 cm    PV Vmax:        0.88 m/s AV Area (Vmean):   2.10 cm    PV Peak grad:   3.1 mmHg AV Area (VTI):     2.04 cm    RVOT Peak grad: 3 mmHg AV Vmax:           110.00 cm/s AV Vmean:          74.000 cm/s AV VTI:            0.219 m AV Peak Grad:      4.8 mmHg AV Mean Grad:      3.0 mmHg LVOT Vmax:         60.10 cm/s LVOT Vmean:  44.800 cm/s LVOT VTI:          0.129 m LVOT/AV VTI ratio: 0.59  AORTA Ao Root diam: 3.60 cm MITRAL VALVE               TRICUSPID VALVE MV Area (PHT): 4.86 cm    TR Peak grad:   11.8 mmHg MV Decel Time: 156 msec    TR Vmax:        172.00 cm/s MV E velocity: 62.60 cm/s MV A velocity:  86.50 cm/s  SHUNTS MV E/A ratio:  0.72        Systemic VTI:  0.13 m                            Systemic Diam: 2.10 cm Nelva Bush MD Electronically signed by Nelva Bush MD Signature Date/Time: 08/28/2020/9:56:00 AM    Final       ASSESSMENT/PLAN   Presented in respiratory failure, non massive hemoptysis,  enlarging RUL density with crescent air sign. 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 Meds adjusted. The hemoptysis is from the infection (on going necrosis). ++ neutropenia Heme involved. Granix started -sputum c/s, afb, fungal. -continue present antibiotic regimen -incentive spirometry -possible bronch to r/o other causes yet to be decided, quite frial - if physically able may need t -surg input regarding surgical intervention to aid in the control of the rul infection        Thank you for allowing me to participate in the care of this patient.   Patient/Family are satisfied with care plan and all questions have been answered.  This document was prepared using Dragon voice recognition software and may include unintentional dictation errors.     Wallene Huh, M.D.  Division of [place]

## 2020-09-21 NOTE — Plan of Care (Signed)
Pt orientedx4, remains on 3L [place] but increased to 4L [place] at pt request feeling like there wasn't enough oxygen flowing through the cannula, maintaining adequate O2 saturaitons. BP stable. Tachypneic at times, continued DOE, continues to have productive cough with hemoptysis but reports more feeling like he's trying to clear his throat of the blood rather than coughing it up. PRN tessalon perles and tussionex given for cough per MAR. Adequate UO overnight, remained continent, no BM this shift. Turns self in bed. Fall/safety precautions in place, rounding performed.  Problem: Education: Goal: Knowledge of General Education information will improve Description: Including pain rating scale, medication(s)/side effects and non-pharmacologic comfort measures Outcome: Progressing   Problem: Health Behavior/Discharge Planning: Goal: Ability to manage health-related needs will improve Outcome: Progressing   Problem: Clinical Measurements: Goal: Ability to maintain clinical measurements within normal limits will improve Outcome: Progressing Goal: Will remain free from infection Outcome: Not Met (add Reason)  Continued ABX for infection. Problem: Activity: Goal: Risk for activity intolerance will decrease Outcome: Progressing   Problem: Nutrition: Goal: Adequate nutrition will be maintained Outcome: Progressing   Goal: Diagnostic test results will improve Outcome: Progressing Goal: Respiratory complications will improve Outcome: Not Progressing Goal: Cardiovascular complication will be avoided Outcome: Progressing  Problem: Nutrition: Goal: Adequate nutrition will be maintained Outcome: Progressing   Problem: Coping: Goal: Level of anxiety will decrease Outcome: Progressing   Problem: Elimination: Goal: Will not experience complications related to bowel motility Outcome: Progressing Goal: Will not experience complications related to urinary retention Outcome: Progressing   Problem:  Pain Managment: Goal: General experience of comfort will improve Outcome: Progressing   Problem: Safety: Goal: Ability to remain free from injury will improve Outcome: Progressing   Problem: Skin Integrity: Goal: Risk for impaired skin integrity will decrease Outcome: Progressing

## 2020-09-21 NOTE — Progress Notes (Signed)
Date of Admission:  09/17/2020      ID: Marcus [name] is a 60 y.o. male  Principal Problem:   Acute on chronic respiratory failure with hypoxia (HCC) Active Problems:   Seropositive rheumatoid arthritis (HCC)   Chronic obstructive pulmonary disease (HCC)   Sepsis due to undetermined organism (HCC)   Hypokalemia   Chronic diastolic CHF (congestive heart failure) (HCC)   Cavitary pneumonia   Acquired neutropenia (HCC)   Hemoptysis   Lung abscess (HCC)   Protein-calorie malnutrition, severe    Subjective: Coughing up red currant jelly like sputum Says he is feeling better than before  Medications:   Chlorhexidine Gluconate Cloth  6 each Topical Q0600   feeding supplement  237 mL Oral TID BM   guaiFENesin  600 mg Oral BID   linezolid  600 mg Oral Q12H   multivitamin with minerals  1 tablet Oral Daily   mupirocin ointment   Nasal BID   nicotine  21 mg Transdermal Daily   predniSONE  10 mg Oral BID WC   Tbo-filgastrim (GRANIX) SQ  480 mcg Subcutaneous Daily    Objective: Vital signs in last 24 hours: Temp:  [97.4 F (36.3 C)-97.6 F (36.4 C)] 97.6 F (36.4 C) (06/17 1206) Pulse Rate:  [65-90] 73 (06/17 1206) Resp:  [18-22] 18 (06/17 1206) BP: (102-139)/(61-86) 102/61 (06/17 1206) SpO2:  [90 %-100 %] 98 % (06/17 1206) Weight:  [60.1 kg] 60.1 kg (06/17 0428)  PHYSICAL EXAM:  General: Alert, cooperative, some distress, appears stated age.  Lungs: crepitations rt side Heart: Regular rate and rhythm, no murmur, rub or gallop. Abdomen: Soft, non-tender,not distended. Bowel sounds normal. No masses Extremities: atraumatic, no cyanosis. No edema. No clubbing Skin: No rashes or lesions. Or bruising Lymph: Cervical, supraclavicular normal. Neurologic: Grossly non-focal  Lab Results Recent Labs    09/19/20 0506 09/20/20 0443 09/21/20 0405  WBC 3.7* 1.5* 8.3  HGB 9.8* 10.0* 10.1*  HCT 29.0* 31.3* 31.9*  NA 136 138  --   K 3.4* 3.9  --   CL 103 101  --   CO2 24 31   --   BUN 14 14  --   CREATININE 0.48* 0.34*  --    Liver Panel No results for input(s): PROT, ALBUMIN, AST, ALT, ALKPHOS, BILITOT, BILIDIR, IBILI in the last 72 hours. Sedimentation Rate No results for input(s): ESRSEDRATE in the last 72 hours. C-Reactive Protein No results for input(s): CRP in the last 72 hours.  Microbiology:  Studies/Results: MR CERVICAL SPINE W WO CONTRAST  Result Date: 09/19/2020 CLINICAL DATA:  Acute or progressive myelopathy. EXAM: MRI CERVICAL SPINE WITHOUT AND WITH CONTRAST TECHNIQUE: Multiplanar and multiecho pulse sequences of the cervical spine, to include the craniocervical junction and cervicothoracic junction, were obtained without and with intravenous contrast. CONTRAST:  77mL GADAVIST GADOBUTROL 1 MMOL/ML IV SOLN COMPARISON:  No previous cervical imaging. FINDINGS: Alignment: Straightening of the normal cervical lordosis. 1 mm degenerative anterolisthesis C7-T1. Vertebrae: No fracture or focal bone lesion. Cord: No primary cord lesion.  See below regarding stenosis. Posterior Fossa, vertebral arteries, paraspinal tissues: Large sebaceous cyst of the upper left neck. Disc levels: Foramen magnum is widely patent.  C1-2 and C2-3 are normal. C3-4: Endplate osteophytes and bulging of the disc. Canal stenosis with AP diameter in the midline 6 mm. Effacement of the subarachnoid space and slight indentation of the cord. Moderate bilateral foraminal narrowing. C4-5: Endplate osteophytes and protrusion of the disc more prominent towards the left. Effacement of  the subarachnoid space and cord deformity, worse on the left. AP diameter of the canal 5.9 mm. Bilateral foraminal stenosis. C5-6: Spondylosis with endplate osteophytes and bulging of the disc. Canal narrowing with AP diameter in the midline 8.3 mm. No compressive effect upon the cord. Bilateral foraminal narrowing. C6-7: Mild bulging of the disc. No canal stenosis. Foraminal narrowing on the right that could affect the  exiting C7 nerve. C7-T1: Facet osteoarthritis with 1 mm of anterolisthesis. No stenosis of the canal or foramina. 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 AP diameter of the canal at C3-4 6 mm. AP diameter of the spinal canal at C4-5 5.9 mm. Effacement of the subarachnoid space with some indentation of the cord. No abnormal cord T2 signal however. Foraminal stenosis that could cause neural compression on either side at C3-4, either side at C4-5, either side at C5-6 and on the right C6-7. Electronically Signed   By: Nelson Chimes M.D.   On: 09/19/2020 18:01   MR THORACIC SPINE W WO CONTRAST  Result Date: 09/19/2020 CLINICAL DATA:  Acute or progressive myelopathy. EXAM: MRI THORACIC WITHOUT AND WITH CONTRAST TECHNIQUE: Multiplanar and multiecho pulse sequences of the thoracic spine were obtained without and with intravenous contrast. CONTRAST:  33mL GADAVIST GADOBUTROL 1 MMOL/ML IV SOLN COMPARISON:  Cervical study same day FINDINGS: Alignment:  Normal Vertebrae: No fracture or focal bone lesion. Cord: No cord compression or primary cord lesion. Spinal canal widely patent throughout the region. No evidence of arteriovenous malformation. Paraspinal and other soft tissues: Negative Disc levels: No significant disc pathology in the thoracic region. Wide patency of the canal and foramina. Ordinary mild facet osteoarthritis without edema or enhancement. No encroachment upon the neural spaces. IMPRESSION: Negative thoracic study. No cord compression or cord lesion. No significant degenerative changes. Electronically Signed   By: Nelson Chimes M.D.   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  This may not be totally amenable to antibiotics.  But currently on  linezolid which is an excellent drug for MRSA with good lung penetration.  Recommend CT surgery/bronch.  Can repeat an x-ray early next week.  Rheumatoid arthritis with Felty syndrome  Neutropenia has improved with G-CSF. Followed by heme-onc While linezolid is on board need to follow the counts closely.  History of splenectomy for Felty's  COPD: Oxygen dependent Continues to smoke nicotine  Discussed the management with the patient ID will follow him peripherally this weekend.  Call if needed.

## 2020-09-21 NOTE — TOC Progression Note (Signed)
Transition of Care [facility]) - Progression Note    Patient Details  Name: Marcus [name] MRN: [id number] Date of Birth: 1961-01-08  Transition of Care [facility]) CM/SW [place], RN Phone Number: 09/21/2020, 9:55 AM  Clinical Narrative:   Patient continues to be on 4L O2, TOC discussed with care team as a potential for palliative care consult due to patient's condition and number of admissions since March.   Palliative consult ordered.    Expected Discharge Plan: Home/Self Care Barriers to Discharge: Continued Medical Work up  Expected Discharge Plan and Services Expected Discharge Plan: Home/Self Care   Discharge Planning Services: CM Consult Post Acute Care Choice: NA Living arrangements for the past 2 months: Single Family Home                                       Social Determinants of Health (SDOH) Interventions    Readmission Risk Interventions Readmission Risk Prevention Plan 09/20/2020 08/25/2020  Transportation Screening Complete Complete  PCP or Specialist Appt within 3-5 Days - Complete  HRI or [place] - Complete  Social Work Consult for [place] Planning/Counseling - Complete  Palliative Care Screening - Complete  Medication Review [employer]) Complete Complete  PCP or Specialist appointment within 3-5 days of discharge Complete -  [place] or Home Care Consult Complete -  Palliative Care Screening Not Applicable -  [place] Not Applicable -  Some recent data might be hidden

## 2020-09-22 DIAGNOSIS — J441 Chronic obstructive pulmonary disease with (acute) exacerbation: Secondary | ICD-10-CM | POA: Diagnosis not present

## 2020-09-22 DIAGNOSIS — J852 Abscess of lung without pneumonia: Secondary | ICD-10-CM | POA: Diagnosis not present

## 2020-09-22 LAB — CBC WITH DIFFERENTIAL/PLATELET
Abs Immature Granulocytes: 2.22 10*3/uL — ABNORMAL HIGH (ref 0.00–0.07)
Basophils Absolute: 0 10*3/uL (ref 0.0–0.1)
Basophils Relative: 0 %
Eosinophils Absolute: 0 10*3/uL (ref 0.0–0.5)
Eosinophils Relative: 0 %
HCT: 33.8 % — ABNORMAL LOW (ref 39.0–52.0)
Hemoglobin: 10.9 g/dL — ABNORMAL LOW (ref 13.0–17.0)
Immature Granulocytes: 5 %
Lymphocytes Relative: 3 %
Lymphs Abs: 1.3 10*3/uL (ref 0.7–4.0)
MCH: 28 pg (ref 26.0–34.0)
MCHC: 32.2 g/dL (ref 30.0–36.0)
MCV: 86.9 fL (ref 80.0–100.0)
Monocytes Absolute: 2.3 10*3/uL — ABNORMAL HIGH (ref 0.1–1.0)
Monocytes Relative: 5 %
Neutro Abs: 37 10*3/uL — ABNORMAL HIGH (ref 1.7–7.7)
Neutrophils Relative %: 87 %
Platelets: 197 10*3/uL (ref 150–400)
RBC: 3.89 MIL/uL — ABNORMAL LOW (ref 4.22–5.81)
RDW: 19.2 % — ABNORMAL HIGH (ref 11.5–15.5)
Smear Review: NORMAL
WBC: 42.9 10*3/uL — ABNORMAL HIGH (ref 4.0–10.5)
nRBC: 0.1 % (ref 0.0–0.2)

## 2020-09-22 LAB — CULTURE, BLOOD (ROUTINE X 2)
Culture: NO GROWTH
Culture: NO GROWTH
Special Requests: ADEQUATE

## 2020-09-22 LAB — ACID FAST SMEAR (AFB, MYCOBACTERIA): Acid Fast Smear: NEGATIVE

## 2020-09-22 MED ORDER — FLUCONAZOLE IN SODIUM CHLORIDE 200-0.9 MG/100ML-% IV SOLN
200.0000 mg | INTRAVENOUS | Status: DC
  Administered 2020-09-22 – 2020-09-23 (×2): 200 mg via INTRAVENOUS
  Filled 2020-09-22 (×3): qty 100

## 2020-09-22 NOTE — Progress Notes (Signed)
[facility] PROGRESS NOTE    Marcus [name]  YKZ:[id number] DOB: [birth year] DOA: 09/17/2020 PCP: [name], FNP    Brief Narrative:  Marcus [name] is a 60 y.o. M with RA c/b Felty syndrome/neutropenia, smoking, and COPD as well as recently diagnosed MRSA abscess who presents with failure of outpatient treamtent.  Patient admitted last month for 8 days for cough and shortness of breath as well as hemoptysis found to have a large cavitary lesion in the right upper lobe concerning for lung abscess.  Sputum was positive for MRSA and so he was discharged on 6 weeks of oral doxycycline.  He reports adherence to his medication after some initial improvement with IV vancomycin in the hospital, he then started to get persistently worse, finally was coughing up blood and had chest pain and malaise/fatigue, so he came to the ER.  He was started on linezolid by ID, continue to have some hemoptysis, pulmonology is recommending bronchoscopy and a possible surgery.  EEG was negative for any seizure-like activity.  Imaging with concern of spinal stenosis but no emergent need for any intervention.  Neurology is recommending outpatient neurosurgery evaluation once improved from current hospitalization.  If patient developed any incontinence or worsening focal weakness then neurosurgery should be consulted urgently.  Patient was started on Granix due to worsening neutropenia , received 2 doses which resulted in markedly elevated leukocytosis and neutrophils-discontinue today after talking with hematology.  Assessment & Plan:  Acute on chronic respiratory failure with hypoxia.  Improved, currently seems to be around his baseline. At baseline he is on 2 L.  Currently on 3L of oxygen. -Continue with incentive spirometry and flutter valve -Continue with supplemental oxygen  Necrotizing pneumonia causing sepsis Presented with tachycardia, tachypnea, leukopenia, and blood pressure less than  100. Initially received vancomycin, cefepime and Flagyl later transitioned to linezolid by ID Pulmonology is recommending bronchoscopy, multiple labs including Fungitell is pending, concern of aspergilloma, might need bronchoscopy or a surgery. -Continue with linezolid  Neutropenia.  Noted during last hospitalization.  He was started on Granix for 3 doses by hematology, received 2 doses and today developed marked leukocytosis and elevated neutrophils at 37.  Granix was discontinued after talking with hematology. 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  Resolved -Replete potassium as needed and monitor  Shakiness.  There was some witnessed shakiness, neurology was consulted for concern of seizure-like activity.  No shakiness or any other seizure noted.  EEG normal. Neurology signed off and they are recommending outpatient neurosurgery evaluation for some spinal stenosis, no emergent need.  If patient developed any new focal weakness or incontinence then they can be reconsulted and at that point he will need emergent neurosurgery evaluation. -Continue to monitor  Chronic diastolic CHF Euvolemic  Intussusception This is an apparently incidental finding, no evidence of small bowel obstruction, belly exam benign - Advance diet as tolerated -Stop PPI   Anemia of chronic disease Hgb stable with some improvement at 10.9 today in setting of mild hemoptysis -Trend Hgb -Monitor for ongoing bleeding  Hyponatremia.  Resolved.   Smoking Cessation recommended, modalities discussed  Severe protein calorie malnutrition As evidenced by severe loss of subcutaneous muscle mass and fat, chronic infection, smoking -Consutl dietitian  Disposition: Status is: Inpatient  Remains inpatient appropriate because: protect  failed outpatient therapy, still on higher than baseline O2, requires ongoing diagnostic testing  perhaps as dictated by Pulmonogloy  Dispo:  The patient is from: Home              Anticipated d/c is to: SNF              Patient currently is not medically stable to d/c.   Difficult to place patient No              Level of care: Med-Surg  MDM: The below labs and imaging reports were reviewed and summarized above.  Medication management as above.  This is a severe threat to life and bodily function    DVT prophylaxis:   SCDs.  Code Status: Full code Family Communication: Brother was updated at bedside  Subjective: Per patient his cough and hemoptysis slowly improving, still spitting some blood.  Shortness of breath is improving while resting.  Objective: Vitals:   09/22/20 0500 09/22/20 0522 09/22/20 0826 09/22/20 1205  BP:  (!) 148/95 127/90 129/81  Pulse:  77 70 87  Resp:  20 (!) 22 (!) 24  Temp:  (!) 97.5 F (36.4 C) (!) 97.4 F (36.3 C) 97.6 F (36.4 C)  TempSrc:  Oral    SpO2:  97% 98% 96%  Weight: 59.4 kg       Intake/Output Summary (Last 24 hours) at 09/22/2020 1329 Last data filed at 09/22/2020 1749 Gross per 24 hour  Intake 354 ml  Output 1900 ml  Net -1546 ml    Filed Weights   09/19/20 1000 09/21/20 0428 09/22/20 0500  Weight: 63.7 kg 60.1 kg 59.4 kg    Examination:  General.  Chronically ill-appearing, very frail gentleman, in no acute distress. Pulmonary.  Lungs clear bilaterally, normal respiratory effort. CV.  Regular rate and rhythm, no JVD, rub or murmur. Abdomen.  Soft, nontender, nondistended, BS positive. CNS.  Alert and oriented x3.  No focal neurologic deficit. Extremities.  No edema, no cyanosis, pulses intact and symmetrical. Psychiatry.  Judgment and insight appears normal.   Data Reviewed: I have personally reviewed following labs and imaging studies:  CBC: Recent Labs  Lab 09/17/20 1345 09/18/20 0533 09/19/20 0506 09/20/20 0443 09/21/20 0405 09/22/20 0518  WBC 3.1* 4.9 3.7* 1.5* 8.3 42.9*  NEUTROABS 1.5*  --  2.5 0.5*  5.9 37.0*  HGB 12.9* 9.8* 9.8* 10.0* 10.1* 10.9*  HCT 40.2 29.7* 29.0* 31.3* 31.9* 33.8*  MCV 84.1 83.4 82.4 84.8 87.2 86.9  PLT 190 203 256 262 238 449    Basic Metabolic Panel: Recent Labs  Lab 09/17/20 1345 09/18/20 0533 09/19/20 0506 09/20/20 0443  NA 131* 131* 136 138  K 3.3* 3.6 3.4* 3.9  CL 96* 104 103 101  CO2 _0 GLUCOSE 137* 72 163* 132*  BUN 33* _1 CREATININE 0.78 0.48* 0.48* 0.34*  CALCIUM 7.9* 7.2* 7.8* 8.2*  MG  --   --  1.8 2.0    GFR: Estimated Creatinine Clearance: 83.5 mL/min (A) (by C-G formula based on SCr of 0.34 mg/dL (L)). Liver Function Tests: Recent Labs  Lab 09/17/20 1345 09/18/20 0533  AST 21 15  ALT 12 9  ALKPHOS 276* 168*  BILITOT 1.0 0.8  PROT 6.7 5.0*  ALBUMIN 2.4* 1.8*    No results for input(s): LIPASE, AMYLASE in the last 168 hours. No results for input(s): AMMONIA in the last 168 hours. Coagulation Profile: Recent Labs  Lab 09/17/20 1345  INR 1.1    Cardiac Enzymes: No results for input(s): CKTOTAL, CKMB, CKMBINDEX, TROPONINI in the last 168 hours. BNP (  last 3 results) No results for input(s): PROBNP in the last 8760 hours. HbA1C: No results for input(s): HGBA1C in the last 72 hours. CBG: No results for input(s): GLUCAP in the last 168 hours. Lipid Profile: No results for input(s): CHOL, HDL, LDLCALC, TRIG, CHOLHDL, LDLDIRECT in the last 72 hours. Thyroid Function Tests: No results for input(s): TSH, T4TOTAL, FREET4, T3FREE, THYROIDAB in the last 72 hours. Anemia Panel: No results for input(s): VITAMINB12, FOLATE, FERRITIN, TIBC, IRON, RETICCTPCT in the last 72 hours. Urine analysis:    Component Value Date/Time   COLORURINE AMBER (A) 08/22/2020 1253   APPEARANCEUR HAZY (A) 08/22/2020 1253   LABSPEC 1.024 08/22/2020 1253   PHURINE 5.0 08/22/2020 [place] 08/22/2020 1253   HGBUR NEGATIVE 08/22/2020 [place] 08/22/2020 1253   KETONESUR NEGATIVE 08/22/2020 1253    PROTEINUR NEGATIVE 08/22/2020 1253   UROBILINOGEN 0.2 10/21/2012 1737   NITRITE NEGATIVE 08/22/2020 1253   LEUKOCYTESUR NEGATIVE 08/22/2020 1253   Sepsis Labs: _0 (procalcitonin:4,lacticacidven:4)  ) Recent Results (from the past 240 hour(s))  Blood culture (routine x 2)     Status: None   Collection Time: 09/17/20  1:58 PM   Specimen: BLOOD  Result Value Ref Range Status   Specimen Description BLOOD BLOOD LEFT FOREARM  Final   Special Requests   Final    BOTTLES DRAWN AEROBIC AND ANAEROBIC Blood Culture adequate volume   Culture   Final    NO GROWTH 5 DAYS Performed at [facility], [address] [place], [place] [address]    Report Status 09/22/2020 FINAL  Final  Blood culture (routine x 2)     Status: None   Collection Time: 09/17/20  1:59 PM   Specimen: BLOOD  Result Value Ref Range Status   Specimen Description BLOOD BLOOD RIGHT FOREARM  Final   Special Requests   Final    BOTTLES DRAWN AEROBIC AND ANAEROBIC Blood Culture results may not be optimal due to an excessive volume of blood received in culture bottles   Culture   Final    NO GROWTH 5 DAYS Performed at [facility], [address] [place], [place] [address]    Report Status 09/22/2020 FINAL  Final  Resp Panel by RT-PCR (Flu A&B, Covid) Nasopharyngeal Swab     Status: None   Collection Time: 09/17/20  2:14 PM   Specimen: Nasopharyngeal Swab; Nasopharyngeal(NP) swabs in vial transport medium  Result Value Ref Range Status   SARS Coronavirus 2 by RT PCR NEGATIVE NEGATIVE Final    Comment: (NOTE) SARS-CoV-2 target nucleic acids are NOT DETECTED.  The SARS-CoV-2 RNA is generally detectable in upper respiratory specimens during the acute phase of infection. The lowest concentration of SARS-CoV-2 viral copies this assay can detect is 138 copies/mL. A negative result does not preclude SARS-Cov-2 infection and should not be used as the sole basis for treatment or other patient  management decisions. A negative result may occur with  improper specimen collection/handling, submission of specimen other than nasopharyngeal swab, presence of viral mutation(s) within the areas targeted by this assay, and inadequate number of viral copies(<138 copies/mL). A negative result must be combined with clinical observations, patient history, and epidemiological information. The expected result is Negative.  Fact Sheet for Patients:  EntrepreneurPulse.com.au  Fact Sheet for Healthcare Providers:  IncredibleEmployment.be  This test is no t yet approved or cleared by the [place] FDA and  has been authorized for detection and/or diagnosis of SARS-CoV-2 by FDA  under an Emergency Use Authorization (EUA). This EUA will remain  in effect (meaning this test can be used) for the duration of the COVID-19 declaration under Section 564(b)(1) of the Act, 21 U.S.C.section 360bbb-3(b)(1), unless the authorization is terminated  or revoked sooner.       Influenza A by PCR NEGATIVE NEGATIVE Final   Influenza B by PCR NEGATIVE NEGATIVE Final    Comment: (NOTE) The Xpert Xpress SARS-CoV-2/FLU/RSV plus assay is intended as an aid in the diagnosis of influenza from Nasopharyngeal swab specimens and should not be used as a sole basis for treatment. Nasal washings and aspirates are unacceptable for Xpert Xpress SARS-CoV-2/FLU/RSV testing.  Fact Sheet for Patients: EntrepreneurPulse.com.au  Fact Sheet for Healthcare Providers: IncredibleEmployment.be  This test is not yet approved or cleared by the [place] FDA and has been authorized for detection and/or diagnosis of SARS-CoV-2 by FDA under an Emergency Use Authorization (EUA). This EUA will remain in effect (meaning this test can be used) for the duration of the COVID-19 declaration under Section 564(b)(1) of the Act, 21 U.S.C. section 360bbb-3(b)(1),  unless the authorization is terminated or revoked.  Performed at [facility], [address] [place], [place] [address]   MRSA PCR Screening     Status: Abnormal   Collection Time: 09/18/20  8:36 AM   Specimen: Nasopharyngeal  Result Value Ref Range Status   MRSA by PCR POSITIVE (A) NEGATIVE Final    Comment:        The GeneXpert MRSA Assay (FDA approved for NASAL specimens only), is one component of a comprehensive MRSA colonization surveillance program. It is not intended to diagnose MRSA infection nor to guide or monitor treatment for MRSA infections. RESULT CALLED TO, READ BACK BY AND VERIFIED WITH: Leota Jacobsen 1029 09/18/20 GM Performed at [facility], [address] [place], [place] [address]   Aspergillus Ag, BAL/Serum     Status: None   Collection Time: 09/19/20  5:05 AM   Specimen: Vein; Blood  Result Value Ref Range Status   Aspergillus Ag, BAL/Serum 0.02 0.00 - 0.49 Index Final    Comment: (NOTE) Performed At: [facility] [place], [place] [id number] Rush Farmer MD [id number]   Expectorated Sputum Assessment w Gram Stain, Rflx to Resp Cult     Status: None   Collection Time: 09/20/20  5:33 AM   Specimen: Expectorated Sputum  Result Value Ref Range Status   Specimen Description EXPECTORATED SPUTUM  Final   Special Requests NONE  Final   Sputum evaluation   Final    THIS SPECIMEN IS ACCEPTABLE FOR SPUTUM CULTURE Performed at [facility], [address] [place], [place] [address]    Report Status 09/20/2020 FINAL  Final  Culture, Respiratory w Gram Stain     Status: None (Preliminary result)   Collection Time: 09/20/20  5:33 AM  Result Value Ref Range Status   Specimen Description   Final    EXPECTORATED SPUTUM Performed at [facility], [address] [place], [place] [address]    Special Requests   Final    NONE Reflexed from [id number] Performed at [facility],  [address] [place], [place] [address]    Gram Stain   Final    ABUNDANT WBC PRESENT,BOTH PMN AND MONONUCLEAR FEW GRAM POSITIVE COCCI IN PAIRS IN CLUSTERS    Culture   Final    MODERATE STAPHYLOCOCCUS AUREUS CULTURE REINCUBATED FOR BETTER GROWTH Performed at [facility],  [address] [place], [place] [address]    Report Status PENDING  Incomplete  Acid Fast Smear (AFB)     Status: None   Collection Time: 09/20/20  5:33 AM   Specimen: Sputum  Result Value Ref Range Status   AFB Specimen Processing Concentration  Final   Acid Fast Smear Negative  Final    Comment: (NOTE) Performed At: [facility] [place], [place] [id number] Rush Farmer MD [id number]    Source (AFB) EXPECTORATED SPUTUM  Final    Comment: Performed at [facility], [address] [place], [place] [address]  Anaerobic culture w Gram Stain     Status: None (Preliminary result)   Collection Time: 09/20/20  5:33 AM   Specimen: Expectorated Sputum  Result Value Ref Range Status   Specimen Description   Final    EXPECTORATED SPUTUM Performed at [facility], [address], [place], [place] [address]    Special Requests   Final    Normal Performed at [facility], [place], [place] [address]    Gram Stain   Final    RARE WBC PRESENT,BOTH PMN AND MONONUCLEAR RARE GRAM POSITIVE COCCI IN PAIRS IN CLUSTERS Performed at [facility], [place] [address] [place], [place] [address]    Culture PENDING  Incomplete   Report Status PENDING  Incomplete  Culture, fungus without smear     Status: None (Preliminary result)   Collection Time: 09/20/20  5:33 AM   Specimen: Expectorated Sputum  Result Value Ref Range Status   Specimen Description   Final    EXPECTORATED SPUTUM Performed at [facility], [address] [place], [place] [address]    Special Requests   Final    Normal Performed at [facility], [address], [place], [place] [address]    Culture   Final    CULTURE REINCUBATED FOR BETTER GROWTH Performed at [facility], [place] [address] [place], [place] [address]    Report Status PENDING  Incomplete  Expectorated Sputum Assessment w Gram Stain, Rflx to Resp Cult     Status: None   Collection Time: 09/20/20  9:13 AM   Specimen: Sputum  Result Value Ref Range Status   Specimen Description SPUTUM  Final   Special Requests Normal  Final   Sputum evaluation   Final    THIS SPECIMEN IS ACCEPTABLE FOR SPUTUM CULTURE Performed at [facility], [address] [place], [place] [address]    Report Status 09/20/2020 FINAL  Final  Culture, Respiratory w Gram Stain     Status: None (Preliminary result)   Collection Time: 09/20/20  9:13 AM   Specimen: SPU  Result Value Ref Range Status   Specimen Description   Final    SPUTUM Performed at [facility], [address], [place], [place] [address]    Special Requests   Final    Normal Reflexed from [id number] Performed at [facility], [address] [place], [place] [address]    Gram Stain   Final    RARE WBC PRESENT,BOTH PMN AND MONONUCLEAR RARE GRAM POSITIVE COCCI IN PAIRS IN CLUSTERS Performed at [facility], [place] [address] [place],  [address]    Culture PENDING  Incomplete   Report Status PENDING  Incomplete         Radiology Studies: No results found.    Scheduled Meds:  Chlorhexidine Gluconate Cloth  6  each Topical Q0600   feeding supplement  237 mL Oral TID BM   guaiFENesin  600 mg Oral BID   linezolid  600 mg Oral Q12H   multivitamin with minerals  1 tablet Oral Daily   mupirocin ointment   Nasal BID   nicotine  21 mg Transdermal Daily   predniSONE  10 mg Oral BID WC   Continuous Infusions:     LOS: 5 days    Time spent: 30 minutes  This record has been created using Systems analyst. Errors have been sought and corrected,but may not  always be located. Such creation errors do not reflect on the standard of care.   Lorella Nimrod, MD [facility] 09/22/2020, 1:29 PM     Please page though [place] or Epic secure chat:  For [employer], Adult nurse

## 2020-09-22 NOTE — Progress Notes (Signed)
Pulmonary and Critical Care Medicine          Date: 09/22/2020,   MRN# [id number] Marcus [name] Mar 04, 1961     AdmissionWeight: 63.7 kg                 CurrentWeight: 59.4 kg   Referring physician: Dr Loleta Books   CHIEF COMPLAINT:   Pneumonia of right lung with cavitary mass   HISTORY OF PRESENT ILLNESS   60 yo M w/ hx of COPD, Feltys syndrome (enlarged spleen, low WBC count associated with RA), epigastric hernia and rheumatoid arthritis.  Patient had admission 01/2020 to [facility] with findings of pneumoperitoneum he was intubated during this time, had procedure done with graham patch incision hernia repair s/p exlap. He had MRSA pneumonia 08/2020, he has been on antibiotics with doxycycline on outpatient. He came in to ER with complaints of hemoptysis.  PCCM consultation for additional evaluation and management. Patient reports having visual symptoms and seizure like activity few weeks ago.  09/19/20- patient seems slightly improved, his microbiology is still in process. Currently on empiric therapy.  MRSA is +.  Hemoptysis has slowed down.  09/22/20- patient is still hemoptysizing.  He is on room air and feels weak. No changes to medical plan. Resp cx with Candida dublisiensis have initiated duflucan IV, will ask pharm/ID team to reivew meds and narrow  PAST MEDICAL HISTORY   Past Medical History:  Diagnosis Date  . COPD (chronic obstructive pulmonary disease) ([place])   . Felty syndrome ([place])   . Hernia, epigastric   . Pancytopenia ([place])   . Seropositive rheumatoid arthritis ([place])   . Tobacco use disorder      SURGICAL HISTORY   Past Surgical History:  Procedure Laterality Date  . INCISIONAL HERNIA REPAIR  01/17/2020   Procedure: HERNIA REPAIR INCISIONAL AND Silvestre Gunner;  Surgeon: Kinsinger, Arta Bruce, MD;  Location: [facility] ORS;  Service: General;;  . LAPAROTOMY N/A 01/17/2020   Procedure: EXPLORATORY LAPAROTOMY;  Surgeon: Kieth Brightly Arta Bruce, MD;  Location: [facility]  ORS;  Service: General;  Laterality: N/A;     FAMILY HISTORY   Family History  Problem Relation Age of Onset  . CAD Brother   . COPD Brother      SOCIAL HISTORY   Social History   Tobacco Use  . Smoking status: Every Day    Packs/day: 1.00    Pack years: 0.00    Types: Cigarettes  . Smokeless tobacco: Never  Substance Use Topics  . Alcohol use: No    Alcohol/week: 0.0 standard drinks  . Drug use: Yes    Types: Marijuana     MEDICATIONS    Home Medication:  REM   Current Medication:  Current Facility-Administered Medications:  .  acetaminophen (TYLENOL) tablet 650 mg, 650 mg, Oral, Q6H PRN, 650 mg at 09/21/20 1925 **OR** acetaminophen (TYLENOL) suppository 650 mg, 650 mg, Rectal, Q6H PRN, Para Skeans, MD .  benzonatate (TESSALON) capsule 200 mg, 200 mg, Oral, BID PRN, Athena Masse, MD, 200 mg at 09/21/20 2017 .  Chlorhexidine Gluconate Cloth 2 % PADS 6 each, 6 each, Topical, Q0600, Lorella Nimrod, MD, 6 each at 09/22/20 0542 .  chlorpheniramine-HYDROcodone (TUSSIONEX) 10-8 MG/5ML suspension 5 mL, 5 mL, Oral, Q12H PRN, Athena Masse, MD, 5 mL at 09/21/20 2017 .  feeding supplement (ENSURE ENLIVE / ENSURE PLUS) liquid 237 mL, 237 mL, Oral, TID BM, Amin, Sumayya, MD, 237 mL at 09/22/20 1434 .  guaiFENesin (MUCINEX) 12  hr tablet 600 mg, 600 mg, Oral, BID, Mansy, Jan A, MD, 600 mg at 09/22/20 0946 .  ipratropium-albuterol (DUONEB) 0.5-2.5 (3) MG/3ML nebulizer solution 3 mL, 3 mL, Nebulization, Q4H PRN, Athena Masse, MD, 3 mL at 09/18/20 2208 .  linezolid (ZYVOX) tablet 600 mg, 600 mg, Oral, Q12H, Ravishankar, Jayashree, MD, 600 mg at 09/22/20 0946 .  multivitamin with minerals tablet 1 tablet, 1 tablet, Oral, Daily, Lorella Nimrod, MD, 1 tablet at 09/22/20 1433 .  mupirocin ointment (BACTROBAN) 2 %, , Nasal, BID, Lorella Nimrod, MD, Given at 09/22/20 0947 .  nicotine (NICODERM CQ - dosed in mg/24 hours) patch 21 mg, 21 mg, Transdermal, Daily, Para Skeans, MD, 21  mg at 09/22/20 0947 .  predniSONE (DELTASONE) tablet 10 mg, 10 mg, Oral, BID WC, Para Skeans, MD, 10 mg at 09/22/20 0946    ALLERGIES   Levaquin [levofloxacin in d5w] and Methotrexate     REVIEW OF SYSTEMS    Review of Systems:  Gen:  Denies  fever, sweats, chills weigh loss  HEENT: Denies blurred vision, double vision, ear pain, eye pain, hearing loss, nose bleeds, sore throat Cardiac:  No dizziness, chest pain or heaviness, chest tightness,edema Resp:   reports dyspnea/sob/chest pain worse on right, hemoptysis  Gi: Denies swallowing difficulty, stomach pain, nausea or vomiting, diarrhea, constipation, bowel incontinence Gu:  Denies bladder incontinence, burning urine Ext:   Denies Joint pain, stiffness or swelling Skin: Denies  skin rash, easy bruising or bleeding or hives Endoc:  Denies polyuria, polydipsia , polyphagia or weight change Psych:   Denies depression, insomnia or hallucinations   Other:  All other systems negative   VS: BP 129/81 (BP Location: Right Arm)   Pulse 87   Temp 97.6 F (36.4 C)   Resp (!) 24   Wt 59.4 kg   SpO2 96%   BMI 21.14 kg/m      PHYSICAL EXAM    GENERAL:NAD, no fevers, chills, no weakness no fatigue HEAD: Normocephalic, atraumatic.  EYES: Pupils equal, round, reactive to light. Extraocular muscles intact. No scleral icterus.  MOUTH: Moist mucosal membrane. Dentition intact. No abscess noted. +hemoptysis EAR, NOSE, THROAT: Clear without exudates. No external lesions.  NECK: Supple. No thyromegaly. No nodules. No JVD.  PULMONARY: rhonchi at right upper lung zone CARDIOVASCULAR: S1 and S2. Regular rate and rhythm. No murmurs, rubs, or gallops. No edema. Pedal pulses 2+ bilaterally.  GASTROINTESTINAL: Soft, nontender, nondistended. No masses. Positive bowel sounds. No hepatosplenomegaly.  MUSCULOSKELETAL: No swelling, clubbing, or edema. Range of motion full in all extremities.  NEUROLOGIC: Cranial nerves II through XII are  intact. No gross focal neurological deficits. Sensation intact. Reflexes intact.  SKIN: No ulceration, lesions, rashes, or cyanosis. Skin warm and dry. Turgor intact.  PSYCHIATRIC: Mood, affect within normal limits. The patient is awake, alert and oriented x 3. Insight, judgment intact.       IMAGING    DG Chest 2 View  Result Date: 09/06/2020 CLINICAL DATA:  Shortness of breath.  Cough. EXAM: CHEST - 2 VIEW COMPARISON:  Chest x-ray 08/28/2020.  CT 08/22/2020. FINDINGS: Mediastinum unremarkable. Heart size normal. Cavitary process in the right mid lung again noted. No change identified. No pleural effusion or pneumothorax. Old right rib fractures. IMPRESSION: Cavitary process in the right mid lung again noted. Cavitary process best identified by prior CT. Chest is unchanged from prior exam. Electronically Signed   By: [place]   On: 09/06/2020 14:04   CT Angio Chest  PE W and/or Wo Contrast  Result Date: 09/17/2020 CLINICAL DATA:  Hemoptysis, shortness of breath, abdominal distension EXAM: CT ANGIOGRAPHY CHEST CT ABDOMEN AND PELVIS WITH CONTRAST TECHNIQUE: Multidetector CT imaging of the chest was performed using the standard protocol during bolus administration of intravenous contrast. Multiplanar CT image reconstructions and MIPs were obtained to evaluate the vascular anatomy. Multidetector CT imaging of the abdomen and pelvis was performed using the standard protocol during bolus administration of intravenous contrast. CONTRAST:  69mL OMNIPAQUE IOHEXOL 350 MG/ML SOLN COMPARISON:  CT 06/20/2020, 06/22/2020 FINDINGS: CTA CHEST FINDINGS Cardiovascular: Satisfactory opacification of the pulmonary arteries to the segmental level. No evidence of pulmonary embolism. Thoracic aorta is normal in course and caliber. Atherosclerotic calcifications of the aorta and coronary arteries. Normal heart size. No pericardial effusion. Mediastinum/Nodes: Mildly prominent mediastinal lymph nodes are again noted,  likely reactive. No axillary or hilar lymphadenopathy. Lungs/Pleura: Redemonstrated cavitary mass located within the inferomedial aspect of the right upper lobe measuring approximately 9.9 x 6.6 x 7.4 cm (previously measured approximately 7.8 x 4.5 x 5.5 cm on 08/22/2020. Increased surrounding consolidation and ground-glass opacity within the adjacent right upper lobe and right middle lobe previously seen cavitary lesion within the medial aspect of the left upper lobe has resolved with small thin walled pleural cyst remaining (series 6, image 44). Scattered areas of ground-glass opacity within the bilateral lower lobes and upper lobes. Background of moderate emphysema. No pleural effusion or pneumothorax. Musculoskeletal: Remote right sixth and seventh rib fractures. No new or acute osseous findings. No chest wall abnormality. Review of the MIP images confirms the above findings. CT ABDOMEN and PELVIS FINDINGS Hepatobiliary: Cholelithiasis without evidence of pericholecystic inflammation by CT. Liver within normal limits. No biliary dilatation. Pancreas: Unremarkable. No pancreatic ductal dilatation or surrounding inflammatory changes. Spleen: Prior splenectomy. Adrenals/Urinary Tract: Unremarkable adrenal glands. Kidneys enhance symmetrically without focal lesion, stone, or hydronephrosis. Ureters are nondilated. Urinary bladder appears unremarkable. Stomach/Bowel: Stomach is within normal limits. Appendix appears normal (series 2, image 58). Incidental note of a small bowel-small bowel intussusception within the mid left abdomen (series 2, image 38). No evidence of bowel wall thickening, distention, or inflammatory changes. Vascular/Lymphatic: Fusiform infrarenal abdominal aortic aneurysm measuring up to 3.1 cm, unchanged. Aortoiliac atherosclerosis. No abdominopelvic lymphadenopathy. Reproductive: Prostate is unremarkable. Other: No free fluid. No abdominopelvic fluid collection. No pneumoperitoneum. Fat  containing midline supraumbilical hernia without evidence of complication. Musculoskeletal: No acute or significant osseous findings. Review of the MIP images confirms the above findings. IMPRESSION: 1. No evidence of acute pulmonary embolism. 2. Enlarging cavitary mass within the inferomedial aspect of the right upper lobe measuring up to 9.9 cm (previously measured approximately 7.8 x 4.5 x 5.5 cm on 08/22/2020). Increased surrounding consolidation and ground-glass opacity within the adjacent right upper lobe and right middle lobe. Findings remain most compatible with cavitary/necrotizing pneumonia. 3. Scattered areas of ground-glass opacity within the bilateral lower lobes and upper lobes, concerning for multifocal infection. 4. Interval resolution of previously seen cavitary lesion within the medial aspect of the left upper lobe with small thin walled pleural cyst remaining. 5. Cholelithiasis without evidence of pericholecystic inflammation by CT. 6. Incidental note of a small bowel-small bowel intussusception within the mid left abdomen. No evidence of bowel obstruction. 7. Fat containing midline supraumbilical hernia without evidence of complication. 8. Stable infrarenal abdominal aortic aneurysm measuring up to 3.1 cm. Recommend follow-up ultrasound every 3 years. This recommendation follows ACR consensus guidelines: White Paper of the ACR Incidental Findings Committee II  on Vascular Findings. J Am Coll Radiol 2013; 10:789-794. Aortic Atherosclerosis (ICD10-I70.0) and Emphysema (ICD10-J43.9). Electronically Signed   By: Davina Poke D.O.   On: 09/17/2020 16:38   MR BRAIN W WO CONTRAST  Result Date: 09/18/2020 CLINICAL DATA:  Brain mass or lesion. 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 Recent MRSA pneumonia. Patient reports visual symptoms and seizure-like activity a few weeks ago. EXAM: MRI HEAD WITHOUT AND WITH CONTRAST TECHNIQUE:  Multiplanar, multiecho pulse sequences of the brain and surrounding structures were obtained without and with intravenous contrast. CONTRAST:  53mL GADAVIST GADOBUTROL 1 MMOL/ML IV SOLN COMPARISON:  No pertinent prior exams available for comparison. FINDINGS: Brain: Intermittently motion degraded examination, limiting evaluation. Most notably, there is moderate/severe motion degradation of the sagittal T1 weighted sequence, moderate/severe motion degradation of the axial T2/FLAIR sequence, moderate motion degradation of the axial T1 weighted postcontrast sequence and moderate motion degradation of the coronal T1 weighted postcontrast sequence. Mild generalized cerebral and cerebellar atrophy. Advanced patchy and confluent T2/FLAIR hyperintensity within the cerebral white matter, nonspecific but most often secondary to chronic small vessel ischemia. Chronic lacunar infarct within the left lentiform nucleus. Small foci of SWI signal loss within the left basal ganglia and along the margin of the posterior right lateral ventricle, likely reflecting chronic microhemorrhages. Additional subcentimeter focus of SWI signal loss within the midline cerebellum, which may reflect an additional chronic microhemorrhage or cavernoma. There is no acute infarct. No evidence of intracranial mass. No extra-axial fluid collection. No midline shift. Within the limitations of motion degradation, no abnormal intracranial enhancement is identified. Vascular: Expected proximal arterial flow voids. Non dominant intracranial right vertebral artery. Skull and upper cervical spine: Within the limitations of motion degradation, no focal marrow lesion is identified. Sinuses/Orbits: Visualized orbits show no acute finding. 1.9 cm right maxillary sinus mucous retention cyst. Trace left maxillary sinus mucosal thickening. Other: Trace bilateral mastoid effusions. 2.8 cm ovoid lesion within the left suboccipital scalp demonstrating heterogeneous but  predominantly T2 hyperintense signal, T1 hypointense signal, restricted diffusion and no convincing abnormal enhancement. This is favored to reflect a sebaceous/epidermoid cyst. IMPRESSION: Motion degraded examination, as described and limiting evaluation. No evidence of acute intracranial abnormality. Severe patchy and confluent T2/FLAIR hyperintensity within the cerebral white matter, nonspecific but most often secondary to chronic small vessel ischemia. Chronic left basal ganglia lacunar infarct. Subcentimeter focus of SWI signal loss within the midline cerebellum, which may reflect a chronic microhemorrhage or cavernoma. Mild generalized parenchymal atrophy. Incidentally noted 1.9 cm right maxillary sinus mucous retention cyst. Trace bilateral mastoid effusions. Electronically Signed   By: Kellie Simmering DO   On: 09/18/2020 15:59   MR CERVICAL SPINE W WO CONTRAST  Result Date: 09/19/2020 CLINICAL DATA:  Acute or progressive myelopathy. EXAM: MRI CERVICAL SPINE WITHOUT AND WITH CONTRAST TECHNIQUE: Multiplanar and multiecho pulse sequences of the cervical spine, to include the craniocervical junction and cervicothoracic junction, were obtained without and with intravenous contrast. CONTRAST:  73mL GADAVIST GADOBUTROL 1 MMOL/ML IV SOLN COMPARISON:  No previous cervical imaging. FINDINGS: Alignment: Straightening of the normal cervical lordosis. 1 mm degenerative anterolisthesis C7-T1. Vertebrae: No fracture or focal bone lesion. Cord: No primary cord lesion.  See below regarding stenosis. Posterior Fossa, vertebral arteries, paraspinal tissues: Large sebaceous cyst of the upper left neck. Disc levels: Foramen magnum is widely patent.  C1-2 and C2-3 are normal. C3-4: Endplate osteophytes and bulging of the disc. Canal stenosis with AP diameter in the midline  6 mm. Effacement of the subarachnoid space and slight indentation of the cord. Moderate bilateral foraminal narrowing. C4-5: Endplate osteophytes and  protrusion of the disc more prominent towards the left. Effacement of the subarachnoid space and cord deformity, worse on the left. AP diameter of the canal 5.9 mm. Bilateral foraminal stenosis. C5-6: Spondylosis with endplate osteophytes and bulging of the disc. Canal narrowing with AP diameter in the midline 8.3 mm. No compressive effect upon the cord. Bilateral foraminal narrowing. C6-7: Mild bulging of the disc. No canal stenosis. Foraminal narrowing on the right that could affect the exiting C7 nerve. C7-T1: Facet osteoarthritis with 1 mm of anterolisthesis. No stenosis of the canal or foramina. 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 AP diameter of the canal at C3-4 6 mm. AP diameter of the spinal canal at C4-5 5.9 mm. Effacement of the subarachnoid space with some indentation of the cord. No abnormal cord T2 signal however. Foraminal stenosis that could cause neural compression on either side at C3-4, either side at C4-5, either side at C5-6 and on the right C6-7. Electronically Signed   By: Nelson Chimes M.D.   On: 09/19/2020 18:01   MR THORACIC SPINE W WO CONTRAST  Result Date: 09/19/2020 CLINICAL DATA:  Acute or progressive myelopathy. EXAM: MRI THORACIC WITHOUT AND WITH CONTRAST TECHNIQUE: Multiplanar and multiecho pulse sequences of the thoracic spine were obtained without and with intravenous contrast. CONTRAST:  21mL GADAVIST GADOBUTROL 1 MMOL/ML IV SOLN COMPARISON:  Cervical study same day FINDINGS: Alignment:  Normal Vertebrae: No fracture or focal bone lesion. Cord: No cord compression or primary cord lesion. Spinal canal widely patent throughout the region. No evidence of arteriovenous malformation. Paraspinal and other soft tissues: Negative Disc levels: No significant disc pathology in the thoracic region. Wide patency of the canal and foramina. Ordinary mild facet osteoarthritis without edema or enhancement. No encroachment upon the neural spaces.  IMPRESSION: Negative thoracic study. No cord compression or cord lesion. No significant degenerative changes. Electronically Signed   By: Nelson Chimes M.D.   On: 09/19/2020 18:03   CT ABDOMEN PELVIS W CONTRAST  Result Date: 09/17/2020 CLINICAL DATA:  Hemoptysis, shortness of breath, abdominal distension EXAM: CT ANGIOGRAPHY CHEST CT ABDOMEN AND PELVIS WITH CONTRAST TECHNIQUE: Multidetector CT imaging of the chest was performed using the standard protocol during bolus administration of intravenous contrast. Multiplanar CT image reconstructions and MIPs were obtained to evaluate the vascular anatomy. Multidetector CT imaging of the abdomen and pelvis was performed using the standard protocol during bolus administration of intravenous contrast. CONTRAST:  43mL OMNIPAQUE IOHEXOL 350 MG/ML SOLN COMPARISON:  CT 06/20/2020, 06/22/2020 FINDINGS: CTA CHEST FINDINGS Cardiovascular: Satisfactory opacification of the pulmonary arteries to the segmental level. No evidence of pulmonary embolism. Thoracic aorta is normal in course and caliber. Atherosclerotic calcifications of the aorta and coronary arteries. Normal heart size. No pericardial effusion. Mediastinum/Nodes: Mildly prominent mediastinal lymph nodes are again noted, likely reactive. No axillary or hilar lymphadenopathy. Lungs/Pleura: Redemonstrated cavitary mass located within the inferomedial aspect of the right upper lobe measuring approximately 9.9 x 6.6 x 7.4 cm (previously measured approximately 7.8 x 4.5 x 5.5 cm on 08/22/2020. Increased surrounding consolidation and ground-glass opacity within the adjacent right upper lobe and right middle lobe previously seen cavitary lesion within the medial aspect of the left upper lobe has resolved with small thin walled pleural cyst remaining (series 6, image 44). Scattered areas of ground-glass opacity within the bilateral lower lobes and upper lobes. Background  of moderate emphysema. No pleural effusion or  pneumothorax. Musculoskeletal: Remote right sixth and seventh rib fractures. No new or acute osseous findings. No chest wall abnormality. Review of the MIP images confirms the above findings. CT ABDOMEN and PELVIS FINDINGS Hepatobiliary: Cholelithiasis without evidence of pericholecystic inflammation by CT. Liver within normal limits. No biliary dilatation. Pancreas: Unremarkable. No pancreatic ductal dilatation or surrounding inflammatory changes. Spleen: Prior splenectomy. Adrenals/Urinary Tract: Unremarkable adrenal glands. Kidneys enhance symmetrically without focal lesion, stone, or hydronephrosis. Ureters are nondilated. Urinary bladder appears unremarkable. Stomach/Bowel: Stomach is within normal limits. Appendix appears normal (series 2, image 58). Incidental note of a small bowel-small bowel intussusception within the mid left abdomen (series 2, image 38). No evidence of bowel wall thickening, distention, or inflammatory changes. Vascular/Lymphatic: Fusiform infrarenal abdominal aortic aneurysm measuring up to 3.1 cm, unchanged. Aortoiliac atherosclerosis. No abdominopelvic lymphadenopathy. Reproductive: Prostate is unremarkable. Other: No free fluid. No abdominopelvic fluid collection. No pneumoperitoneum. Fat containing midline supraumbilical hernia without evidence of complication. Musculoskeletal: No acute or significant osseous findings. Review of the MIP images confirms the above findings. IMPRESSION: 1. No evidence of acute pulmonary embolism. 2. Enlarging cavitary mass within the inferomedial aspect of the right upper lobe measuring up to 9.9 cm (previously measured approximately 7.8 x 4.5 x 5.5 cm on 08/22/2020). Increased surrounding consolidation and ground-glass opacity within the adjacent right upper lobe and right middle lobe. Findings remain most compatible with cavitary/necrotizing pneumonia. 3. Scattered areas of ground-glass opacity within the bilateral lower lobes and upper lobes,  concerning for multifocal infection. 4. Interval resolution of previously seen cavitary lesion within the medial aspect of the left upper lobe with small thin walled pleural cyst remaining. 5. Cholelithiasis without evidence of pericholecystic inflammation by CT. 6. Incidental note of a small bowel-small bowel intussusception within the mid left abdomen. No evidence of bowel obstruction. 7. Fat containing midline supraumbilical hernia without evidence of complication. 8. Stable infrarenal abdominal aortic aneurysm measuring up to 3.1 cm. Recommend follow-up ultrasound every 3 years. This recommendation follows ACR consensus guidelines: White Paper of the ACR Incidental Findings Committee II on Vascular Findings. J Am Coll Radiol 2013; 10:789-794. Aortic Atherosclerosis (ICD10-I70.0) and Emphysema (ICD10-J43.9). Electronically Signed   By: Davina Poke D.O.   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 There is increase in consolidation in this area compared to most recent study. Lungs elsewhere clear. Heart size and pulmonary vascular normal. No adenopathy appreciable. No bone lesions. IMPRESSION: Cavitary mass right middle lobe anteriorly with increase in surrounding consolidation compared to most recent study. The lungs elsewhere are clear. Heart size normal. No adenopathy appreciable by radiography. Electronically Signed   By: Lowella Grip III M.D.   On: 09/17/2020 13:54   DG Chest Port 1 View  Result Date: 08/28/2020 CLINICAL DATA:  Hypoxia. EXAM: PORTABLE CHEST 1 VIEW COMPARISON:  CT 08/22/2020.  Chest x-ray 08/22/2020. FINDINGS: Mediastinum hilar structures normal. Heart size normal. Cavitary process in the right mid lung  and left upper lung best identified by prior CT. No new findings are noted. No pleural effusion or pneumothorax. Heart size stable. Degenerative change thoracic spine. Old right rib fractures present. IMPRESSION: Cavitary process in the right mid lung and left upper  lung best identified by prior CT. Chest is unchanged from prior exam. Electronically Signed   By: Marcello Moores  Register   On: 08/28/2020 08:25   DG Swallowing Func-Speech Pathology  Result Date: 09/06/2020 Please refer to "Notes" tab for Speech Pathology notes.  ECHOCARDIOGRAM COMPLETE  Result Date: 08/28/2020    ECHOCARDIOGRAM REPORT   Patient Name:   BRIANA NEWMAN [facility] Date of Exam: 08/28/2020 Medical Rec #:  [id number]    Height:       66.0 in Accession #:    [id number]   Weight:       133.6 lb Date of Birth:  May 25, 1960     BSA:          1.685 m Patient Age:    68 years     BP:           120/74 mmHg Patient Gender: M            HR:           76 bpm. 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 COPD. Tobacco use disorder.  Sonographer:     Sherrie Sport RDCS (AE) Referring Phys:  [id number] Sharen Hones Diagnosing Phys: Nelva Bush MD  Sonographer Comments: Technically challenging study due to limited acoustic windows. Image acquisition challenging due to COPD. IMPRESSIONS  1. Left ventricular ejection fraction, by estimation, is 55 to 60%. The left ventricle has normal function. Left ventricular endocardial border not optimally defined to evaluate regional wall motion. Left ventricular diastolic parameters are consistent with Grade I diastolic dysfunction (impaired relaxation).  2. Right ventricular systolic function is normal. The right ventricular size is mildly enlarged. Mildly increased right ventricular wall thickness. There is normal pulmonary artery systolic pressure.  3. The  mitral valve is degenerative. Trivial mitral valve regurgitation. No evidence of mitral stenosis.  4. The aortic valve was not well visualized. Aortic valve regurgitation is not visualized. No aortic stenosis is present.  5. Pulmonic valve regurgitation not well assessed.  6. The inferior vena cava is normal in size with greater than 50% respiratory variability, suggesting right atrial pressure of 3 mmHg. Conclusion(s)/Recommendation(s): Valves suboptimally imaged to exclude endocarditis. If clinical concern persists, transesophageal echocardiogram should be considered. FINDINGS  Left Ventricle: Left ventricular ejection fraction, by estimation, is 55 to 60%. The left ventricle has normal function. Left ventricular endocardial border not optimally defined to evaluate regional wall motion. The left ventricular internal cavity size was normal in size. There is no left ventricular hypertrophy. Left ventricular diastolic parameters are consistent with Grade I diastolic dysfunction (impaired relaxation). Right Ventricle: The right ventricular size is mildly enlarged. Mildly increased right ventricular wall thickness. Right ventricular systolic function is normal. There is normal pulmonary artery systolic pressure. The tricuspid regurgitant velocity is 1.72 m/s, and with an assumed right atrial pressure of 3 mmHg, the estimated right ventricular systolic pressure is 41.6 mmHg. Left Atrium: Left atrial size was normal in size. Right Atrium: Right atrial size was normal in size. Pericardium: There is no evidence of pericardial effusion. Mitral Valve: The mitral valve is degenerative in appearance. There is mild thickening of the mitral valve leaflet(s). There is mild calcification of the mitral valve leaflet(s). Mild mitral annular  calcification. Trivial mitral valve regurgitation. No evidence of mitral valve stenosis. Tricuspid Valve: The tricuspid valve is grossly normal. Tricuspid valve regurgitation is trivial. Aortic  Valve: The aortic valve was not well visualized. Aortic valve regurgitation is not visualized. No aortic stenosis is present. Aortic valve mean gradient measures 3.0 mmHg. Aortic valve peak gradient measures 4.8 mmHg. Aortic valve area, by VTI measures 2.04 cm. Pulmonic Valve: The pulmonic valve was not well visualized. Pulmonic valve regurgitation not well assessed. Aorta: The aortic root is normal in size and structure. Pulmonary Artery: The pulmonary artery is not well seen. Venous: The inferior vena cava is normal in size with greater than 50% respiratory variability, suggesting right atrial pressure of 3 mmHg. IAS/Shunts: The interatrial septum was not well visualized.  LEFT VENTRICLE PLAX 2D LVIDd:         5.38 cm  Diastology LVIDs:         2.94 cm  LV e' medial:    6.09 cm/s LV PW:         1.02 cm  LV E/e' medial:  10.3 LV IVS:        0.73 cm  LV e' lateral:   11.40 cm/s LVOT diam:     2.10 cm  LV E/e' lateral: 5.5 LV SV:         45 LV SV Index:   27 LVOT Area:     3.46 cm  RIGHT VENTRICLE RV Basal diam:  4.23 cm RV S prime:     9.46 cm/s TAPSE (M-mode): 2.0 cm LEFT ATRIUM             Index       RIGHT ATRIUM           Index LA diam:        3.50 cm 2.08 cm/m  RA Area:     16.50 cm LA Vol (A2C):   30.0 ml 17.81 ml/m RA Volume:   44.10 ml  26.18 ml/m LA Vol (A4C):   39.3 ml 23.33 ml/m LA Biplane Vol: 35.6 ml 21.13 ml/m  AORTIC VALVE                   PULMONIC VALVE AV Area (Vmax):    1.89 cm    PV Vmax:        0.88 m/s AV Area (Vmean):   2.10 cm    PV Peak grad:   3.1 mmHg AV Area (VTI):     2.04 cm    RVOT Peak grad: 3 mmHg AV Vmax:           110.00 cm/s AV Vmean:          74.000 cm/s AV VTI:            0.219 m AV Peak Grad:      4.8 mmHg AV Mean Grad:      3.0 mmHg LVOT Vmax:         60.10 cm/s LVOT Vmean:        44.800 cm/s LVOT VTI:          0.129 m LVOT/AV VTI ratio: 0.59  AORTA Ao Root diam: 3.60 cm MITRAL VALVE               TRICUSPID VALVE MV Area (PHT): 4.86 cm    TR Peak grad:   11.8 mmHg  MV Decel Time: 156 msec    TR Vmax:        172.00 cm/s MV E velocity:  62.60 cm/s MV A velocity: 86.50 cm/s  SHUNTS MV E/A ratio:  0.72        Systemic VTI:  0.13 m                            Systemic Diam: 2.10 cm Nelva Bush MD Electronically signed by Nelva Bush MD Signature Date/Time: 08/28/2020/9:56:00 AM    Final       ASSESSMENT/PLAN   Acute hypoxemic respiratory failure - present on admission -patient initially required 7L/min supplemental O2 - COVID19 negative  - supplemental O2 during my evaluation 2L/min  - will perform infectious workup for pneumonia -serum fungitell -legionella ab -strep pneumoniae ur AG -Histoplasma Ur Ag -sputum resp cultures -AFB sputum expectorated specimen -sputum cytology  -reviewed pertinent imaging with patient today -please encourage patient to use incentive spirometer few times each hour while hospitalized.     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    Patient/Family are satisfied with care plan and all questions have been answered.  This document was prepared using Dragon voice recognition software and may include unintentional dictation errors.     Ottie Glazier, M.D.  Division of [place]

## 2020-09-22 NOTE — Plan of Care (Signed)
Pt orientedx4, remains on 4L [place], continued production of bloody sputum overnight. Tele in place. C/o pain to sacrum overnight, educated regarding turning/repositioning, PRN tylenol given as well. Fall/safety precautions in place. Adequate UO overnight, no BM this shift. Rounding performed.   Problem: Education: Goal: Knowledge of General Education information will improve Description: Including pain rating scale, medication(s)/side effects and non-pharmacologic comfort measures Outcome: Progressing   Problem: Health Behavior/Discharge Planning: Goal: Ability to manage health-related needs will improve Outcome: Progressing   Problem: Clinical Measurements: Goal: Ability to maintain clinical measurements within normal limits will improve Outcome: Progressing Goal: Will remain free from infection Outcome: Progressing Goal: Diagnostic test results will improve Outcome: Progressing Goal: Respiratory complications will improve Outcome: Progressing Goal: Cardiovascular complication will be avoided Outcome: Progressing   Problem: Activity: Goal: Risk for activity intolerance will decrease Outcome: Progressing   Problem: Nutrition: Goal: Adequate nutrition will be maintained Outcome: Progressing   Problem: Coping: Goal: Level of anxiety will decrease Outcome: Progressing   Problem: Elimination: Goal: Will not experience complications related to bowel motility Outcome: Progressing Goal: Will not experience complications related to urinary retention Outcome: Progressing   Problem: Pain Managment: Goal: General experience of comfort will improve Outcome: Progressing   Problem: Safety: Goal: Ability to remain free from injury will improve Outcome: Progressing   Problem: Skin Integrity: Goal: Risk for impaired skin integrity will decrease Outcome: Progressing

## 2020-09-22 NOTE — Evaluation (Signed)
Clinical/Bedside Swallow Evaluation Patient Details  Name: Marcus [name] MRN: [id number] Date of Birth: May 05, 1960  Today's Date: 09/22/2020 Time: SLP Start Time (ACUTE ONLY): 1350 SLP Stop Time (ACUTE ONLY): 1435 SLP Time Calculation (min) (ACUTE ONLY): 45 min  Past Medical History:  Past Medical History:  Diagnosis Date   COPD (chronic obstructive pulmonary disease) (HCC)    Felty syndrome (HCC)    Hernia, epigastric    Pancytopenia (HCC)    Seropositive rheumatoid arthritis ([place])    Tobacco use disorder    Past Surgical History:  Past Surgical History:  Procedure Laterality Date   INCISIONAL HERNIA REPAIR  01/17/2020   Procedure: HERNIA REPAIR INCISIONAL AND Silvestre Gunner;  Surgeon: Kinsinger, Arta Bruce, MD;  Location: [facility];  Service: General;;   LAPAROTOMY N/A 01/17/2020   Procedure: EXPLORATORY LAPAROTOMY;  Surgeon: Mickeal Skinner, MD;  Location: [facility];  Service: General;  Laterality: N/A;   HPI:  Pt is a 60 y.o. male who presents to the ED with coughing up blood, shortness of breath, cough, and abdominal pain. Patient reports the coughing of blood began past 2 to 3 days. He has had multiple episodes. In addition he has gradually become more short of breath. Patient is also having significant coughing with congestion, patient reports shortness of breath with this.  Patient reports abdominal pain along the incisional site of his previous surgery and reports that it is also progressing along with his symptoms of shortness of breath.  Pt has a.history of COPD, chronic hypoxemic respiratory failure on 2 L oxygen, rheumatoid arthritis, pancytopenia, Moderate protein-calorie malnutrition, Felty syndrome. Last month during admit, suspected lung abscess per chart notes then. See CTs of Chest: "CT confirms the presence of a cavitary infiltrate in the right  upper lobe and shows a smaller area of cavitation anteriorly in the left upper lobe. There was concern of cavitating/necrotizing  pneumonia.  Pt resides at home but is reported to receive care w/ ADLs from his mother in the home, MD unsure of his baseline Cognitive status. 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 Pt is Edentulous.   Assessment / Plan / Recommendation Clinical Impression  Pt appears to present w/ adequate oropharyngeal phase swallow w/ No oropharyngeal phase dysphagia noted, No neuromuscular deficits noted. Pt is Edentulous which impacts mastication moreso of solids -- this is Baseline for pt. He states he does "best" w/ softer foods. Pt consumed po trials w/ No overt, clinical s/s of aspiration during po trials. Pt appears at reduced risk for aspiration following general aspiration precautions and moistening foods well; small bites. Recent MBSS in 08/2020 revealed No aspiration or laryngeal penetration; pharyngeal swallow was timely. Pt is Edentulous at baseline.   During po trials, pt consumed all consistencies w/ no immediate, overt coughing, or decline in vocal quality, or change in respiratory presentation during/post trials. Rest Breaks were encouraged to lessen any WOB. Oral phase appeared [facility] w/ timely bolus management and oral control of bolus propulsion for A-P transfer for swallowing. Oral clearing achieved w/ all trial consistencies given Time for mashing/gumming soft, moist foods. OM Exam appeared [facility] w/ no unilateral weakness noted. Speech Clear. Pt fed self w/ setup support.   Recommend continue a more Mech Soft consistency diet w/ well-Cut meats, moistened foods; Thin liquids. Recommend general aspiration precautions, Pills WHOLE in Puree for safer, easier swallowing. Education given on Pills in Puree; food consistencies and easy to eat options; general aspiration precautions including Rest Breaks to lessen exertion and WOB/SOB w/  tasks. NSG to reconsult if any new needs arise. NSG agreed. Dietician f/u.  SLP Visit Diagnosis: Dysphagia, unspecified (R13.10) (pt Edentulous w/ Pulmonary decline Baseline)    Aspiration  Risk   (reduced following general precautions)    Diet Recommendation  Mech Soft consistency diet w/ well-Cut meats, moistened foods; Thin liquids. Recommend general aspiration precautions, Rest Breaks to lessen exertion and WOB/SOB w/ tasks. Dietician f/u.  Medication Administration: Whole meds with puree (for safer swallowing)    Other  Recommendations Recommended Consults:  (Dietician f/u) Oral Care Recommendations: Oral care BID;Oral care before and after PO;Patient independent with oral care (setup and support) Other Recommendations:  (n/a)   Follow up Recommendations None      Frequency and Duration  (n/a)   (n/a)       Prognosis Prognosis for Safe Diet Advancement: Fair (-Good) Barriers to Reach Goals: Cognitive deficits;Time post onset;Severity of deficits;Behavior Barriers/Prognosis Comment: Edentulous status      Swallow Study   General Date of Onset: 09/17/20 HPI: Pt is a 60 y.o. male who presents to the ED with coughing up blood, shortness of breath, cough, and abdominal pain. Patient reports the coughing of blood began past 2 to 3 days. He has had multiple episodes. In addition he has gradually become more short of breath. Patient is also having significant coughing with congestion, patient reports shortness of breath with this.  Patient reports abdominal pain along the incisional site of his previous surgery and reports that it is also progressing along with his symptoms of shortness of breath.  Pt has a.history of COPD, chronic hypoxemic respiratory failure on 2 L oxygen, rheumatoid arthritis, pancytopenia, Moderate protein-calorie malnutrition, Felty syndrome. Last month during admit, suspected lung abscess per chart notes then. See CTs of Chest: "CT confirms the presence of a cavitary infiltrate in the right  upper lobe and shows a smaller area of cavitation anteriorly in the left upper lobe. There was concern of cavitating/necrotizing pneumonia.  Pt resides at home but is  reported to receive care w/ ADLs from his mother in the home, MD unsure of his baseline Cognitive status. 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 Pt is Edentulous. Type of Study: Bedside Swallow Evaluation Previous Swallow Assessment: ST services recently performed MBSS 08/2020 w/ No aspiration noted during study. Diet Prior to this Study: Dysphagia 3 (soft);Thin liquids (modified by ST services) Temperature Spikes Noted: No (wbc elevated at 42.9) Respiratory Status: Nasal cannula (4L) History of Recent Intubation: No Behavior/Cognition: Alert;Cooperative;Pleasant mood;Requires cueing Oral Cavity Assessment: Within Functional Limits Oral Care Completed by SLP: Recent completion by staff Oral Cavity - Dentition: Edentulous Vision: Functional for self-feeding Self-Feeding Abilities: Able to feed self;Needs set up Patient Positioning: Upright in bed Baseline Vocal Quality: Normal (gravely) Volitional Cough: Strong;Congested Volitional Swallow: Able to elicit    Oral/Motor/Sensory Function Overall Oral Motor/Sensory Function: Within functional limits   Ice Chips Ice chips: Within functional limits Other Comments: 2 trials   Thin Liquid Thin Liquid: Within functional limits Presentation: Cup;Self Fed;Straw (and bottle; ~4 ozs total)    Nectar Thick Nectar Thick Liquid: Not tested   Honey Thick Honey Thick Liquid: Not tested   Puree Puree: Within functional limits Presentation: Self Fed;Spoon (3 trials)   Solid     Solid: Impaired (Edentulous status) Presentation: Self Fed;Spoon (3 trials) Oral Phase Impairments: Impaired mastication (Edentulous) Pharyngeal Phase Impairments:  (none)        Orinda Kenner, MS, [employer] Speech Language Pathologist Rehab Services [phone] [facility] 09/22/2020,3:25 PM

## 2020-09-23 DIAGNOSIS — J852 Abscess of lung without pneumonia: Secondary | ICD-10-CM | POA: Diagnosis not present

## 2020-09-23 DIAGNOSIS — J441 Chronic obstructive pulmonary disease with (acute) exacerbation: Secondary | ICD-10-CM | POA: Diagnosis not present

## 2020-09-23 LAB — CBC WITH DIFFERENTIAL/PLATELET
Abs Immature Granulocytes: 0.95 10*3/uL — ABNORMAL HIGH (ref 0.00–0.07)
Basophils Absolute: 0 10*3/uL (ref 0.0–0.1)
Basophils Relative: 0 %
Eosinophils Absolute: 0 10*3/uL (ref 0.0–0.5)
Eosinophils Relative: 0 %
HCT: 36.5 % — ABNORMAL LOW (ref 39.0–52.0)
Hemoglobin: 11.4 g/dL — ABNORMAL LOW (ref 13.0–17.0)
Immature Granulocytes: 6 %
Lymphocytes Relative: 6 %
Lymphs Abs: 1 10*3/uL (ref 0.7–4.0)
MCH: 28 pg (ref 26.0–34.0)
MCHC: 31.2 g/dL (ref 30.0–36.0)
MCV: 89.7 fL (ref 80.0–100.0)
Monocytes Absolute: 1.2 10*3/uL — ABNORMAL HIGH (ref 0.1–1.0)
Monocytes Relative: 7 %
Neutro Abs: 14 10*3/uL — ABNORMAL HIGH (ref 1.7–7.7)
Neutrophils Relative %: 81 %
Platelets: 200 10*3/uL (ref 150–400)
RBC: 4.07 MIL/uL — ABNORMAL LOW (ref 4.22–5.81)
RDW: 19.7 % — ABNORMAL HIGH (ref 11.5–15.5)
Smear Review: NORMAL
WBC: 17.2 10*3/uL — ABNORMAL HIGH (ref 4.0–10.5)
nRBC: 0.3 % — ABNORMAL HIGH (ref 0.0–0.2)

## 2020-09-23 LAB — BASIC METABOLIC PANEL
Anion gap: 3 — ABNORMAL LOW (ref 5–15)
BUN: 16 mg/dL (ref 6–20)
CO2: 38 mmol/L — ABNORMAL HIGH (ref 22–32)
Calcium: 8.3 mg/dL — ABNORMAL LOW (ref 8.9–10.3)
Chloride: 93 mmol/L — ABNORMAL LOW (ref 98–111)
Creatinine, Ser: 0.46 mg/dL — ABNORMAL LOW (ref 0.61–1.24)
GFR, Estimated: >60 mL/min (ref >60)
Glucose, Bld: 117 mg/dL — ABNORMAL HIGH (ref 70–99)
Potassium: 4.5 mmol/L (ref 3.5–5.1)
Sodium: 134 mmol/L — ABNORMAL LOW (ref 135–145)

## 2020-09-23 LAB — CULTURE, RESPIRATORY W GRAM STAIN: Special Requests: NORMAL

## 2020-09-23 NOTE — Progress Notes (Signed)
Pulmonary and Critical Care Medicine          Date: 09/23/2020,   MRN# [id number] Marcus [name] 07-31-1960     AdmissionWeight: 63.7 kg                 CurrentWeight: 59.1 kg   Referring physician: Dr Loleta Books   CHIEF COMPLAINT:   Pneumonia of right lung with cavitary mass   HISTORY OF PRESENT ILLNESS   60 yo M w/ hx of COPD, Feltys syndrome (enlarged spleen, low WBC count associated with RA), epigastric hernia and rheumatoid arthritis.  Patient had admission 01/2020 to [facility] [place] with findings of pneumoperitoneum he was intubated during this time, had procedure done with graham patch incision hernia repair s/p exlap. He had MRSA pneumonia 08/2020, he has been on antibiotics with doxycycline on outpatient. He came in to ER with complaints of hemoptysis.  PCCM consultation for additional evaluation and management. Patient reports having visual symptoms and seizure like activity few weeks ago.  09/19/20- patient seems slightly improved, his microbiology is still in process. Currently on empiric therapy.  MRSA is +.  Hemoptysis has slowed down.  09/22/20- patient is still hemoptysizing.  He is on room air and feels weak. No changes to medical plan. Resp cx with Candida dublisiensis have initiated duflucan IV, will ask pharm/ID team to reivew meds and narrow  09/23/20- patient is worse with increased O2 requirement, we reviewed CT and CXR together.  Broadening of regimen with vancomycin and zosyn.   09/24/20- patient is improved, he still coughs with hemoptysis.  Fungitell is negative but resp culture is + for fungus and candida.  MRSA +  PAST MEDICAL HISTORY   Past Medical History:  Diagnosis Date  . COPD (chronic obstructive pulmonary disease) ([place])   . 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 Hernia, epigastric   . Pancytopenia ([place])   . Seropositive rheumatoid arthritis ([place])   . Tobacco use disorder      SURGICAL HISTORY   Past Surgical History:  Procedure Laterality Date  .  INCISIONAL HERNIA REPAIR  01/17/2020   Procedure: HERNIA REPAIR INCISIONAL AND Silvestre Gunner;  Surgeon: Kinsinger, Arta Bruce, MD;  Location: [facility];  Service: General;;  . LAPAROTOMY N/A 01/17/2020   Procedure: EXPLORATORY LAPAROTOMY;  Surgeon: Kieth Brightly Arta Bruce, MD;  Location: [facility];  Service: General;  Laterality: N/A;     FAMILY HISTORY   Family History  Problem Relation Age of Onset  . CAD Brother   . COPD Brother      SOCIAL HISTORY   Social History   Tobacco Use  . Smoking status: Every Day    Packs/day: 1.00    Pack years: 0.00    Types: Cigarettes  . Smokeless tobacco: Never  Substance Use Topics  . Alcohol use: No    Alcohol/week: 0.0 standard drinks  . Drug use: Yes    Types: Marijuana     MEDICATIONS    Home Medication:  REM   Current Medication:  Current Facility-Administered Medications:  .  acetaminophen (TYLENOL) tablet 650 mg, 650 mg, Oral, Q6H PRN, 650 mg at 09/21/20 1925 **OR** acetaminophen (TYLENOL) suppository 650 mg, 650 mg, Rectal, Q6H PRN, Para Skeans, MD .  benzonatate (TESSALON) capsule 200 mg, 200 mg, Oral, BID PRN, Athena Masse, MD, 200 mg at 09/22/20 2034 .  Chlorhexidine Gluconate Cloth 2 % PADS 6 each, 6 each, Topical, Q0600, Lorella Nimrod, MD, 6 each at 09/23/20 0517 .  chlorpheniramine-HYDROcodone (  TUSSIONEX) 10-8 MG/5ML suspension 5 mL, 5 mL, Oral, Q12H PRN, Athena Masse, MD, 5 mL at 09/22/20 2034 .  feeding supplement (ENSURE ENLIVE / ENSURE PLUS) liquid 237 mL, 237 mL, Oral, TID BM, Amin, Sumayya, MD, 237 mL at 09/23/20 1024 .  fluconazole (DIFLUCAN) IVPB 200 mg, 200 mg, Intravenous, Q24H, Ottie Glazier, MD, Stopped at 09/22/20 2000 .  guaiFENesin (MUCINEX) 12 hr tablet 600 mg, 600 mg, Oral, BID, Mansy, Jan A, MD, 600 mg at 09/23/20 1022 .  ipratropium-albuterol (DUONEB) 0.5-2.5 (3) MG/3ML nebulizer solution 3 mL, 3 mL, Nebulization, Q4H PRN, Athena Masse, MD, 3 mL at 09/18/20 2208 .  linezolid (ZYVOX) tablet 600  mg, 600 mg, Oral, Q12H, Ravishankar, Jayashree, MD, 600 mg at 09/23/20 1022 .  multivitamin with minerals tablet 1 tablet, 1 tablet, Oral, Daily, Lorella Nimrod, MD, 1 tablet at 09/23/20 1022 .  mupirocin ointment (BACTROBAN) 2 %, , Nasal, BID, Lorella Nimrod, MD, Given at 09/23/20 1024 .  nicotine (NICODERM CQ - dosed in mg/24 hours) patch 21 mg, 21 mg, Transdermal, Daily, Florina Ou V, MD, 21 mg at 09/23/20 1023 .  predniSONE (DELTASONE) tablet 10 mg, 10 mg, Oral, BID WC, Para Skeans, MD, 10 mg at 09/23/20 1022    ALLERGIES   Levaquin [levofloxacin in d5w] and Methotrexate     REVIEW OF SYSTEMS    Review of Systems:  Gen:  Denies  fever, sweats, chills weigh loss  HEENT: Denies blurred vision, double vision, ear pain, eye pain, hearing loss, nose bleeds, sore throat Cardiac:  No dizziness, chest pain or heaviness, chest tightness,edema Resp:   reports dyspnea/sob/chest pain worse on right, hemoptysis  Gi: Denies swallowing difficulty, stomach pain, nausea or vomiting, diarrhea, constipation, bowel incontinence Gu:  Denies bladder incontinence, burning urine Ext:   Denies Joint pain, stiffness or swelling Skin: Denies  skin rash, easy bruising or bleeding or hives Endoc:  Denies polyuria, polydipsia , polyphagia or weight change Psych:   Denies depression, insomnia or hallucinations   Other:  All other systems negative   VS: BP 115/82 (BP Location: Right Arm)   Pulse 75   Temp 98.9 F (37.2 C)   Resp 18   Ht 5' 5.98" (1.676 m)   Wt 59.1 kg   SpO2 97%   BMI 21.04 kg/m      PHYSICAL EXAM    GENERAL:NAD, no fevers, chills, no weakness no fatigue HEAD: Normocephalic, atraumatic.  EYES: Pupils equal, round, reactive to light. Extraocular muscles intact. No scleral icterus.  MOUTH: Moist mucosal membrane. Dentition intact. No abscess noted. +hemoptysis EAR, NOSE, THROAT: Clear without exudates. No external lesions.  NECK: Supple. No thyromegaly. No nodules. No JVD.   PULMONARY: rhonchi at right upper lung zone CARDIOVASCULAR: S1 and S2. Regular rate and rhythm. No murmurs, rubs, or gallops. No edema. Pedal pulses 2+ bilaterally.  GASTROINTESTINAL: Soft, nontender, nondistended. No masses. Positive bowel sounds. No hepatosplenomegaly.  MUSCULOSKELETAL: No swelling, clubbing, or edema. Range of motion full in all extremities.  NEUROLOGIC: Cranial nerves II through XII are intact. No gross focal neurological deficits. Sensation intact. Reflexes intact.  SKIN: No ulceration, lesions, rashes, or cyanosis. Skin warm and dry. Turgor intact.  PSYCHIATRIC: Mood, affect within normal limits. The patient is awake, alert and oriented x 3. Insight, judgment intact.       IMAGING    DG Chest 2 View  Result Date: 09/06/2020 CLINICAL DATA:  Shortness of breath.  Cough. EXAM: CHEST - 2 VIEW COMPARISON:  Chest x-ray 08/28/2020.  CT 08/22/2020. FINDINGS: Mediastinum unremarkable. Heart size normal. Cavitary process in the right mid lung again noted. No change identified. No pleural effusion or pneumothorax. Old right rib fractures. IMPRESSION: Cavitary process in the right mid lung again noted. Cavitary process best identified by prior CT. Chest is unchanged from prior exam. Electronically Signed   By: Marcello Moores  Register   On: 09/06/2020 14:04   CT Angio Chest PE W and/or Wo Contrast  Result Date: 09/17/2020 CLINICAL DATA:  Hemoptysis, shortness of breath, abdominal distension EXAM: CT ANGIOGRAPHY CHEST CT ABDOMEN AND PELVIS WITH CONTRAST TECHNIQUE: Multidetector CT imaging of the chest was performed using the standard protocol during bolus administration of intravenous contrast. Multiplanar CT image reconstructions and MIPs were obtained to evaluate the vascular anatomy. Multidetector CT imaging of the abdomen and pelvis was performed using the standard protocol during bolus administration of intravenous contrast. CONTRAST:  71mL OMNIPAQUE IOHEXOL 350 MG/ML SOLN COMPARISON:  CT  06/20/2020, 06/22/2020 FINDINGS: CTA CHEST FINDINGS Cardiovascular: Satisfactory opacification of the pulmonary arteries to the segmental level. No evidence of pulmonary embolism. Thoracic aorta is normal in course and caliber. Atherosclerotic calcifications of the aorta and coronary arteries. Normal heart size. No pericardial effusion. Mediastinum/Nodes: Mildly prominent mediastinal lymph nodes are again noted, likely reactive. No axillary or hilar lymphadenopathy. Lungs/Pleura: Redemonstrated cavitary mass located within the inferomedial aspect of the right upper lobe measuring approximately 9.9 x 6.6 x 7.4 cm (previously measured approximately 7.8 x 4.5 x 5.5 cm on 08/22/2020. Increased surrounding consolidation and ground-glass opacity within the adjacent right upper lobe and right middle lobe previously seen cavitary lesion within the medial aspect of the left upper lobe has resolved with small thin walled pleural cyst remaining (series 6, image 44). Scattered areas of ground-glass opacity within the bilateral lower lobes and upper lobes. Background of moderate emphysema. No pleural effusion or pneumothorax. Musculoskeletal: Remote right sixth and seventh rib fractures. No new or acute osseous findings. No chest wall abnormality. Review of the MIP images confirms the above findings. CT ABDOMEN and PELVIS FINDINGS Hepatobiliary: Cholelithiasis without evidence of pericholecystic inflammation by CT. Liver within normal limits. No biliary dilatation. Pancreas: Unremarkable. No pancreatic ductal dilatation or surrounding inflammatory changes. Spleen: Prior splenectomy. Adrenals/Urinary Tract: Unremarkable adrenal glands. Kidneys enhance symmetrically without focal lesion, stone, or hydronephrosis. Ureters are nondilated. Urinary bladder appears unremarkable. Stomach/Bowel: Stomach is within normal limits. Appendix appears normal (series 2, image 58). Incidental note of a small bowel-small bowel intussusception  within the mid left abdomen (series 2, image 38). No evidence of bowel wall thickening, distention, or inflammatory changes. Vascular/Lymphatic: Fusiform infrarenal abdominal aortic aneurysm measuring up to 3.1 cm, unchanged. Aortoiliac atherosclerosis. No abdominopelvic lymphadenopathy. Reproductive: Prostate is unremarkable. Other: No free fluid. No abdominopelvic fluid collection. No pneumoperitoneum. Fat containing midline supraumbilical hernia without evidence of complication. Musculoskeletal: No acute or significant osseous findings. Review of the MIP images confirms the above findings. IMPRESSION: 1. No evidence of acute pulmonary embolism. 2. Enlarging cavitary mass within the inferomedial aspect of the right upper lobe measuring up to 9.9 cm (previously measured approximately 7.8 x 4.5 x 5.5 cm on 08/22/2020). Increased surrounding consolidation and ground-glass opacity within the adjacent right upper lobe and right middle lobe. Findings remain most compatible with cavitary/necrotizing pneumonia. 3. Scattered areas of ground-glass opacity within the bilateral lower lobes and upper lobes, concerning for multifocal infection. 4. Interval resolution of previously seen cavitary lesion within the medial aspect of the left upper lobe with small thin  walled pleural cyst remaining. 5. Cholelithiasis without evidence of pericholecystic inflammation by CT. 6. Incidental note of a small bowel-small bowel intussusception within the mid left abdomen. No evidence of bowel obstruction. 7. Fat containing midline supraumbilical hernia without evidence of complication. 8. Stable infrarenal abdominal aortic aneurysm measuring up to 3.1 cm. Recommend follow-up ultrasound every 3 years. This recommendation follows ACR consensus guidelines: White Paper of the ACR Incidental Findings Committee II on Vascular Findings. J Am Coll Radiol 2013; 10:789-794. Aortic Atherosclerosis (ICD10-I70.0) and Emphysema (ICD10-J43.9).  Electronically Signed   By: Davina Poke D.O.   On: 09/17/2020 16:38   MR BRAIN W WO CONTRAST  Result Date: 09/18/2020 CLINICAL DATA:  Brain mass or lesion. 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 Recent MRSA pneumonia. Patient reports visual symptoms and seizure-like activity a few weeks ago. EXAM: MRI HEAD WITHOUT AND WITH CONTRAST TECHNIQUE: Multiplanar, multiecho pulse sequences of the brain and surrounding structures were obtained without and with intravenous contrast. CONTRAST:  56mL GADAVIST GADOBUTROL 1 MMOL/ML IV SOLN COMPARISON:  No pertinent prior exams available for comparison. FINDINGS: Brain: Intermittently motion degraded examination, limiting evaluation. Most notably, there is moderate/severe motion degradation of the sagittal T1 weighted sequence, moderate/severe motion degradation of the axial T2/FLAIR sequence, moderate motion degradation of the axial T1 weighted postcontrast sequence and moderate motion degradation of the coronal T1 weighted postcontrast sequence. Mild generalized cerebral and cerebellar atrophy. Advanced patchy and confluent T2/FLAIR hyperintensity within the cerebral white matter, nonspecific but most often secondary to chronic small vessel ischemia. Chronic lacunar infarct within the left lentiform nucleus. Small foci of SWI signal loss within the left basal ganglia and along the margin of the posterior right lateral ventricle, likely reflecting chronic microhemorrhages. Additional subcentimeter focus of SWI signal loss within the midline cerebellum, which may reflect an additional chronic microhemorrhage or cavernoma. There is no acute infarct. No evidence of intracranial mass. No extra-axial fluid collection. No midline shift. Within the limitations of motion degradation, no abnormal intracranial enhancement is identified. Vascular: Expected proximal arterial flow voids. Non dominant intracranial  right vertebral artery. Skull and upper cervical spine: Within the limitations of motion degradation, no focal marrow lesion is identified. Sinuses/Orbits: Visualized orbits show no acute finding. 1.9 cm right maxillary sinus mucous retention cyst. Trace left maxillary sinus mucosal thickening. Other: Trace bilateral mastoid effusions. 2.8 cm ovoid lesion within the left suboccipital scalp demonstrating heterogeneous but predominantly T2 hyperintense signal, T1 hypointense signal, restricted diffusion and no convincing abnormal enhancement. This is favored to reflect a sebaceous/epidermoid cyst. IMPRESSION: Motion degraded examination, as described and limiting evaluation. No evidence of acute intracranial abnormality. Severe patchy and confluent T2/FLAIR hyperintensity within the cerebral white matter, nonspecific but most often secondary to chronic small vessel ischemia. Chronic left basal ganglia lacunar infarct. Subcentimeter focus of SWI signal loss within the midline cerebellum, which may reflect a chronic microhemorrhage or cavernoma. Mild generalized parenchymal atrophy. Incidentally noted 1.9 cm right maxillary sinus mucous retention cyst. Trace bilateral mastoid effusions. Electronically Signed   By: Kellie Simmering DO   On: 09/18/2020 15:59   MR CERVICAL SPINE W WO CONTRAST  Result Date: 09/19/2020 CLINICAL DATA:  Acute or progressive myelopathy. EXAM: MRI CERVICAL SPINE WITHOUT AND WITH CONTRAST TECHNIQUE: Multiplanar and multiecho pulse sequences of the cervical spine, to include the craniocervical junction and cervicothoracic junction, were obtained without and with intravenous contrast. CONTRAST:  8mL GADAVIST GADOBUTROL 1 MMOL/ML IV SOLN COMPARISON:  No previous cervical  imaging. FINDINGS: Alignment: Straightening of the normal cervical lordosis. 1 mm degenerative anterolisthesis C7-T1. Vertebrae: No fracture or focal bone lesion. Cord: No primary cord lesion.  See below regarding stenosis.  Posterior Fossa, vertebral arteries, paraspinal tissues: Large sebaceous cyst of the upper left neck. Disc levels: Foramen magnum is widely patent.  C1-2 and C2-3 are normal. C3-4: Endplate osteophytes and bulging of the disc. Canal stenosis with AP diameter in the midline 6 mm. Effacement of the subarachnoid space and slight indentation of the cord. Moderate bilateral foraminal narrowing. C4-5: Endplate osteophytes and protrusion of the disc more prominent towards the left. Effacement of the subarachnoid space and cord deformity, worse on the left. AP diameter of the canal 5.9 mm. Bilateral foraminal stenosis. C5-6: Spondylosis with endplate osteophytes and bulging of the disc. Canal narrowing with AP diameter in the midline 8.3 mm. No compressive effect upon the cord. Bilateral foraminal narrowing. C6-7: Mild bulging of the disc. No canal stenosis. Foraminal narrowing on the right that could affect the exiting C7 nerve. C7-T1: Facet osteoarthritis with 1 mm of anterolisthesis. No stenosis of the canal or foramina. 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 AP diameter of the canal at C3-4 6 mm. AP diameter of the spinal canal at C4-5 5.9 mm. Effacement of the subarachnoid space with some indentation of the cord. No abnormal cord T2 signal however. Foraminal stenosis that could cause neural compression on either side at C3-4, either side at C4-5, either side at C5-6 and on the right C6-7. Electronically Signed   By: Nelson Chimes M.D.   On: 09/19/2020 18:01   MR THORACIC SPINE W WO CONTRAST  Result Date: 09/19/2020 CLINICAL DATA:  Acute or progressive myelopathy. EXAM: MRI THORACIC WITHOUT AND WITH CONTRAST TECHNIQUE: Multiplanar and multiecho pulse sequences of the thoracic spine were obtained without and with intravenous contrast. CONTRAST:  43mL GADAVIST GADOBUTROL 1 MMOL/ML IV SOLN COMPARISON:  Cervical study same day FINDINGS: Alignment:  Normal Vertebrae: No fracture or  focal bone lesion. Cord: No cord compression or primary cord lesion. Spinal canal widely patent throughout the region. No evidence of arteriovenous malformation. Paraspinal and other soft tissues: Negative Disc levels: No significant disc pathology in the thoracic region. Wide patency of the canal and foramina. Ordinary mild facet osteoarthritis without edema or enhancement. No encroachment upon the neural spaces. IMPRESSION: Negative thoracic study. No cord compression or cord lesion. No significant degenerative changes. Electronically Signed   By: Nelson Chimes M.D.   On: 09/19/2020 18:03   CT ABDOMEN PELVIS W CONTRAST  Result Date: 09/17/2020 CLINICAL DATA:  Hemoptysis, shortness of breath, abdominal distension EXAM: CT ANGIOGRAPHY CHEST CT ABDOMEN AND PELVIS WITH CONTRAST TECHNIQUE: Multidetector CT imaging of the chest was performed using the standard protocol during bolus administration of intravenous contrast. Multiplanar CT image reconstructions and MIPs were obtained to evaluate the vascular anatomy. Multidetector CT imaging of the abdomen and pelvis was performed using the standard protocol during bolus administration of intravenous contrast. CONTRAST:  82mL OMNIPAQUE IOHEXOL 350 MG/ML SOLN COMPARISON:  CT 06/20/2020, 06/22/2020 FINDINGS: CTA CHEST FINDINGS Cardiovascular: Satisfactory opacification of the pulmonary arteries to the segmental level. No evidence of pulmonary embolism. Thoracic aorta is normal in course and caliber. Atherosclerotic calcifications of the aorta and coronary arteries. Normal heart size. No pericardial effusion. Mediastinum/Nodes: Mildly prominent mediastinal lymph nodes are again noted, likely reactive. No axillary or hilar lymphadenopathy. Lungs/Pleura: Redemonstrated cavitary mass located within the inferomedial aspect of the right upper lobe measuring  approximately 9.9 x 6.6 x 7.4 cm (previously measured approximately 7.8 x 4.5 x 5.5 cm on 08/22/2020. Increased surrounding  consolidation and ground-glass opacity within the adjacent right upper lobe and right middle lobe previously seen cavitary lesion within the medial aspect of the left upper lobe has resolved with small thin walled pleural cyst remaining (series 6, image 44). Scattered areas of ground-glass opacity within the bilateral lower lobes and upper lobes. Background of moderate emphysema. No pleural effusion or pneumothorax. Musculoskeletal: Remote right sixth and seventh rib fractures. No new or acute osseous findings. No chest wall abnormality. Review of the MIP images confirms the above findings. CT ABDOMEN and PELVIS FINDINGS Hepatobiliary: Cholelithiasis without evidence of pericholecystic inflammation by CT. Liver within normal limits. No biliary dilatation. Pancreas: Unremarkable. No pancreatic ductal dilatation or surrounding inflammatory changes. Spleen: Prior splenectomy. Adrenals/Urinary Tract: Unremarkable adrenal glands. Kidneys enhance symmetrically without focal lesion, stone, or hydronephrosis. Ureters are nondilated. Urinary bladder appears unremarkable. Stomach/Bowel: Stomach is within normal limits. Appendix appears normal (series 2, image 58). Incidental note of a small bowel-small bowel intussusception within the mid left abdomen (series 2, image 38). No evidence of bowel wall thickening, distention, or inflammatory changes. Vascular/Lymphatic: Fusiform infrarenal abdominal aortic aneurysm measuring up to 3.1 cm, unchanged. Aortoiliac atherosclerosis. No abdominopelvic lymphadenopathy. Reproductive: Prostate is unremarkable. Other: No free fluid. No abdominopelvic fluid collection. No pneumoperitoneum. Fat containing midline supraumbilical hernia without evidence of complication. Musculoskeletal: No acute or significant osseous findings. Review of the MIP images confirms the above findings. IMPRESSION: 1. No evidence of acute pulmonary embolism. 2. Enlarging cavitary mass within the inferomedial aspect  of the right upper lobe measuring up to 9.9 cm (previously measured approximately 7.8 x 4.5 x 5.5 cm on 08/22/2020). Increased surrounding consolidation and ground-glass opacity within the adjacent right upper lobe and right middle lobe. Findings remain most compatible with cavitary/necrotizing pneumonia. 3. Scattered areas of ground-glass opacity within the bilateral lower lobes and upper lobes, concerning for multifocal infection. 4. Interval resolution of previously seen cavitary lesion within the medial aspect of the left upper lobe with small thin walled pleural cyst remaining. 5. Cholelithiasis without evidence of pericholecystic inflammation by CT. 6. Incidental note of a small bowel-small bowel intussusception within the mid left abdomen. No evidence of bowel obstruction. 7. Fat containing midline supraumbilical hernia without evidence of complication. 8. Stable infrarenal abdominal aortic aneurysm measuring up to 3.1 cm. Recommend follow-up ultrasound every 3 years. This recommendation follows ACR consensus guidelines: White Paper of the ACR Incidental Findings Committee II on Vascular Findings. J Am Coll Radiol 2013; 10:789-794. Aortic Atherosclerosis (ICD10-I70.0) and Emphysema (ICD10-J43.9). Electronically Signed   By: Davina Poke D.O.   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 There is increase in consolidation in this area compared to most recent study. Lungs elsewhere clear. Heart size and pulmonary vascular normal. No adenopathy appreciable. No bone lesions. IMPRESSION: Cavitary mass right middle lobe anteriorly with increase in surrounding consolidation compared to most recent study. The lungs elsewhere are clear.  Heart size normal. No adenopathy appreciable by radiography. Electronically Signed   By: Lowella Grip III M.D.   On: 09/17/2020 13:54   DG Chest Port 1 View  Result Date: 08/28/2020  CLINICAL DATA:  Hypoxia. EXAM: PORTABLE CHEST 1 VIEW COMPARISON:  CT 08/22/2020.  Chest x-ray 08/22/2020. FINDINGS: Mediastinum hilar structures normal. Heart size normal. Cavitary process in the right mid lung and left upper lung best identified by prior CT. No new findings are noted. No pleural effusion or pneumothorax. Heart size stable. Degenerative change thoracic spine. Old right rib fractures present. IMPRESSION: Cavitary process in the right mid lung and left upper lung best identified by prior CT. Chest is unchanged from prior exam. Electronically Signed   By: Marcello Moores  Register   On: 08/28/2020 08:25   DG Swallowing Func-Speech Pathology  Result Date: 09/06/2020 Please refer to "Notes" tab for Speech Pathology notes.  ECHOCARDIOGRAM COMPLETE  Result Date: 08/28/2020    ECHOCARDIOGRAM REPORT   Patient Name:   NETHAN CAUDILLO [facility] Date of Exam: 08/28/2020 Medical Rec #:  [id number]    Height:       66.0 in Accession #:    [id number]   Weight:       133.6 lb Date of Birth:  [birth year]     BSA:          1.685 m Patient Age:    12 years     BP:           120/74 mmHg Patient Gender: M            HR:           76 bpm. 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 COPD. Tobacco use disorder.  Sonographer:     Sherrie Sport RDCS (AE) Referring Phys:  [id number] Sharen Hones Diagnosing Phys: Nelva Bush MD  Sonographer Comments: Technically challenging study due to limited acoustic windows. Image acquisition challenging due to COPD. IMPRESSIONS  1. Left ventricular ejection fraction, by estimation, is 55 to 60%. The left ventricle has normal function. Left  ventricular endocardial border not optimally defined to evaluate regional wall motion. Left ventricular diastolic parameters are consistent with Grade I diastolic dysfunction (impaired relaxation).  2. Right ventricular systolic function is normal. The right ventricular size is mildly enlarged. Mildly increased right ventricular wall thickness. There is normal pulmonary artery systolic pressure.  3. The mitral valve is degenerative. Trivial mitral valve regurgitation. No evidence of mitral stenosis.  4. The aortic valve was not well visualized. Aortic valve regurgitation is not visualized. No aortic stenosis is present.  5. Pulmonic valve regurgitation not well assessed.  6. The inferior vena cava is normal in size with greater than 50% respiratory variability, suggesting right atrial pressure of 3 mmHg. Conclusion(s)/Recommendation(s): Valves suboptimally imaged to exclude endocarditis. If clinical concern persists, transesophageal echocardiogram should be considered. FINDINGS  Left Ventricle: Left ventricular ejection fraction, by estimation, is 55 to 60%. The left ventricle has normal function. Left ventricular endocardial border not optimally defined to evaluate regional wall motion. The left ventricular internal cavity size was normal in size. There is no left ventricular hypertrophy. Left ventricular diastolic parameters are consistent with Grade I diastolic dysfunction (impaired relaxation). Right Ventricle: The right ventricular size is mildly enlarged. Mildly increased right ventricular wall thickness. Right ventricular systolic function is normal. There is normal pulmonary artery systolic pressure. The tricuspid regurgitant velocity is 1.72 m/s, and  with an assumed right atrial pressure of 3 mmHg, the estimated right ventricular systolic pressure is 67.5 mmHg. Left Atrium: Left atrial size was normal in size. Right Atrium: Right atrial size was normal in size. Pericardium: There is no evidence of  pericardial effusion. Mitral Valve: The mitral valve is degenerative in appearance. There is mild thickening of the mitral valve leaflet(s). There is mild calcification of the mitral valve leaflet(s). Mild mitral annular calcification. Trivial mitral valve regurgitation. No evidence of mitral valve stenosis. Tricuspid Valve: The tricuspid valve is grossly normal. Tricuspid valve regurgitation is trivial. Aortic Valve: The aortic valve was not well visualized. Aortic valve regurgitation is not visualized. No aortic stenosis is present. Aortic valve mean gradient measures 3.0 mmHg. Aortic valve peak gradient measures 4.8 mmHg. Aortic valve area, by VTI measures 2.04 cm. Pulmonic Valve: The pulmonic valve was not well visualized. Pulmonic valve regurgitation not well assessed. Aorta: The aortic root is normal in size and structure. Pulmonary Artery: The pulmonary artery is not well seen. Venous: The inferior vena cava is normal in size with greater than 50% respiratory variability, suggesting right atrial pressure of 3 mmHg. IAS/Shunts: The interatrial septum was not well visualized.  LEFT VENTRICLE PLAX 2D LVIDd:         5.38 cm  Diastology LVIDs:         2.94 cm  LV e' medial:    6.09 cm/s LV PW:         1.02 cm  LV E/e' medial:  10.3 LV IVS:        0.73 cm  LV e' lateral:   11.40 cm/s LVOT diam:     2.10 cm  LV E/e' lateral: 5.5 LV SV:         45 LV SV Index:   27 LVOT Area:     3.46 cm  RIGHT VENTRICLE RV Basal diam:  4.23 cm RV S prime:     9.46 cm/s TAPSE (M-mode): 2.0 cm LEFT ATRIUM             Index       RIGHT ATRIUM           Index LA diam:        3.50 cm 2.08 cm/m  RA Area:     16.50 cm LA Vol (A2C):   30.0 ml 17.81 ml/m RA Volume:   44.10 ml  26.18 ml/m LA Vol (A4C):   39.3 ml 23.33 ml/m LA Biplane Vol: 35.6 ml 21.13 ml/m  AORTIC VALVE                   PULMONIC VALVE AV Area (Vmax):    1.89 cm    PV Vmax:        0.88 m/s AV Area (Vmean):   2.10 cm    PV Peak grad:   3.1 mmHg AV Area (VTI):      2.04 cm    RVOT Peak grad: 3 mmHg AV Vmax:           110.00 cm/s AV Vmean:          74.000 cm/s AV VTI:            0.219 m AV Peak Grad:      4.8 mmHg AV Mean Grad:      3.0 mmHg LVOT Vmax:         60.10 cm/s LVOT Vmean:        44.800 cm/s LVOT VTI:  0.129 m LVOT/AV VTI ratio: 0.59  AORTA Ao Root diam: 3.60 cm MITRAL VALVE               TRICUSPID VALVE MV Area (PHT): 4.86 cm    TR Peak grad:   11.8 mmHg MV Decel Time: 156 msec    TR Vmax:        172.00 cm/s MV E velocity: 62.60 cm/s MV A velocity: 86.50 cm/s  SHUNTS MV E/A ratio:  0.72        Systemic VTI:  0.13 m                            Systemic Diam: 2.10 cm Nelva Bush MD Electronically signed by Nelva Bush MD Signature Date/Time: 08/28/2020/9:56:00 AM    Final       ASSESSMENT/PLAN   Acute hypoxemic respiratory failure - present on admission -patient initially required 7L/min supplemental O2 - COVID19 negative  - supplemental O2 during my evaluation 2L/min  - will perform infectious workup for pneumonia -serum fungitell -legionella ab -strep pneumoniae ur AG -Histoplasma Ur Ag -sputum resp cultures -AFB sputum expectorated specimen -sputum cytology  -reviewed pertinent imaging with patient today -please encourage patient to use incentive spirometer few times each hour while hospitalized.     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    Patient/Family are satisfied with care plan and all questions have been answered.   This document was prepared using Dragon voice recognition software and may include unintentional dictation errors.     Ottie Glazier, M.D.  Division of [place]

## 2020-09-23 NOTE — Plan of Care (Signed)
Pt orientedx4, VSS, remains on 3-4L [place]. PRN cough medications given per [facility], continued hemoptysis overnight. No BM this shift, adequate UO. Fall/safety precautions in place, rounding performed, needs/concerns addressed.  Problem: Education: Goal: Knowledge of General Education information will improve Description: Including pain rating scale, medication(s)/side effects and non-pharmacologic comfort measures Outcome: Progressing   Problem: Health Behavior/Discharge Planning: Goal: Ability to manage health-related needs will improve Outcome: Progressing   Problem: Clinical Measurements: Goal: Ability to maintain clinical measurements within normal limits will improve Outcome: Progressing Goal: Will remain free from infection Outcome: Progressing Goal: Diagnostic test results will improve Outcome: Progressing Goal: Respiratory complications will improve Outcome: Progressing Goal: Cardiovascular complication will be avoided Outcome: Progressing   Problem: Activity: Goal: Risk for activity intolerance will decrease Outcome: Progressing   Problem: Nutrition: Goal: Adequate nutrition will be maintained Outcome: Progressing   Problem: Coping: Goal: Level of anxiety will decrease Outcome: Progressing   Problem: Elimination: Goal: Will not experience complications related to bowel motility Outcome: Progressing Goal: Will not experience complications related to urinary retention Outcome: Progressing   Problem: Pain Managment: Goal: General experience of comfort will improve Outcome: Progressing   Problem: Safety: Goal: Ability to remain free from injury will improve Outcome: Progressing   Problem: Skin Integrity: Goal: Risk for impaired skin integrity will decrease Outcome: Progressing

## 2020-09-23 NOTE — Progress Notes (Signed)
[facility] PROGRESS NOTE    MENDELL [name]  ZOX:[id number] DOB: 05-28-1960 DOA: 09/17/2020 PCP: [name], FNP    Brief Narrative:  [name] is a 60 y.o. M with RA c/b Felty syndrome/neutropenia, smoking, and COPD as well as recently diagnosed MRSA abscess who presents with failure of outpatient treamtent.  Patient admitted last month for 8 days for cough and shortness of breath as well as hemoptysis found to have a large cavitary lesion in the right upper lobe concerning for lung abscess.  Sputum was positive for MRSA and so he was discharged on 6 weeks of oral doxycycline.  He reports adherence to his medication after some initial improvement with IV vancomycin in the hospital, he then started to get persistently worse, finally was coughing up blood and had chest pain and malaise/fatigue, so he came to the ER.  He was started on linezolid by ID, continue to have some hemoptysis, pulmonology is recommending bronchoscopy and a possible surgery.  EEG was negative for any seizure-like activity.  Imaging with concern of spinal stenosis but no emergent need for any intervention.  Neurology is recommending outpatient neurosurgery evaluation once improved from current hospitalization.  If patient developed any incontinence or worsening focal weakness then neurosurgery should be consulted urgently.  Patient was started on Granix due to worsening neutropenia , received 2 doses which resulted in markedly elevated leukocytosis and neutrophils-discontinue today after talking with hematology.   Resp cx with Candida dublisiensis-pulmonology is started him on Diflucan.  Assessment & Plan:  Acute on chronic respiratory failure with hypoxia.  Improved, currently seems to be around his baseline. At baseline he is on 2 L.  Currently on 3L of oxygen. -Continue with incentive spirometry and flutter valve -Continue with supplemental oxygen  Necrotizing pneumonia causing sepsis Presented  with tachycardia, tachypnea, leukopenia, and blood pressure less than 100. Initially received vancomycin, cefepime and Flagyl later transitioned to linezolid by ID Pulmonology is recommending bronchoscopy, multiple labs including Fungitell is pending, concern of aspergilloma, might need bronchoscopy or a surgery.  Resp cx with Candida dublisiensis. -Diflucan was started yesterday -Continue with linezolid  Neutropenia.  Noted during last hospitalization.  He was started on Granix for 3 doses by hematology, received 2 doses which resulted in significant increase in leukocytosis and neutrophils, Granix was discontinued after talking with hematology. Leukocytosis and neutrophil started improving, today WBC of 17.2 and neutrophils of 14. 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  Resolved -Replete potassium as needed and monitor  Shakiness.  There was some witnessed shakiness, neurology was consulted for concern of seizure-like activity.  No shakiness or any other seizure noted.  EEG normal. Neurology signed off and they are recommending outpatient neurosurgery evaluation for some spinal stenosis, no emergent need.  If patient developed any new focal weakness or incontinence then they can be reconsulted and at that point he will need emergent neurosurgery evaluation. -Continue to monitor  Chronic diastolic CHF Euvolemic  Intussusception This is an apparently incidental finding, no evidence of small bowel obstruction, belly exam benign - Advance diet as tolerated -Stop PPI   Anemia of chronic disease Hgb stable with some improvement at 10.9 today in setting of mild hemoptysis -Trend Hgb -Monitor for ongoing bleeding  Hyponatremia.  Resolved.   Smoking Cessation recommended, modalities discussed  Severe protein calorie malnutrition As evidenced by severe loss of subcutaneous muscle mass and fat,  chronic infection, smoking -Consutl  dietitian  Disposition: Status is: Inpatient  Remains inpatient appropriate because: protect  failed outpatient therapy, still on higher than baseline O2, requires ongoing diagnostic testing perhaps as dictated by Pulmonogloy  Dispo: The patient is from: Home              Anticipated d/c is to: SNF              Patient currently is not medically stable to d/c.   Difficult to place patient No              Level of care: Med-Surg  MDM: The below labs and imaging reports were reviewed and summarized above.  Medication management as above.  This is a severe threat to life and bodily function    DVT prophylaxis:   SCDs.  Code Status: Full code Family Communication: Discussed with patient.  Subjective: Patient was seen and examined today.  He thinks that he is improving, less cough and hemoptysis although there was significant amount of hemoptysis in the 2 his bed.  His appetite is returning.  Objective: Vitals:   09/22/20 1626 09/22/20 2001 09/23/20 0012 09/23/20 0512  BP: (!) 133/98 129/77 (!) 142/94 132/90  Pulse: (!) 102 85 86 80  Resp: _0 Temp: 98.5 F (36.9 C) (!) 97.5 F (36.4 C) (!) 97.5 F (36.4 C) (!) 96.3 F (35.7 C)  TempSrc:  Oral Oral Axillary  SpO2: 96% 97% 99% 98%  Weight:    59.1 kg  Height:  5' 5.98" (1.676 m)      Intake/Output Summary (Last 24 hours) at 09/23/2020 0754 Last data filed at 09/23/2020 0626 Gross per 24 hour  Intake 1047 ml  Output 2050 ml  Net -1003 ml    Filed Weights   09/21/20 0428 09/22/20 0500 09/23/20 0512  Weight: 60.1 kg 59.4 kg 59.1 kg    Examination:  General.  Frail and malnourished gentleman, in no acute distress. Pulmonary.  Lungs clear bilaterally, normal respiratory effort. CV.  Regular rate and rhythm, no JVD, rub or murmur. Abdomen.  Soft, nontender, nondistended, BS positive. CNS.  Alert and oriented .  No focal neurologic deficit. Extremities.  No edema, no  cyanosis, pulses intact and symmetrical. Psychiatry.  Judgment and insight appears normal.   Data Reviewed: I have personally reviewed following labs and imaging studies:  CBC: Recent Labs  Lab 09/19/20 0506 09/20/20 0443 09/21/20 0405 09/22/20 0518 09/23/20 0528  WBC 3.7* 1.5* 8.3 42.9* 17.2*  NEUTROABS 2.5 0.5* 5.9 37.0* 14.0*  HGB 9.8* 10.0* 10.1* 10.9* 11.4*  HCT 29.0* 31.3* 31.9* 33.8* 36.5*  MCV 82.4 84.8 87.2 86.9 89.7  PLT 256 262 238 197 982    Basic Metabolic Panel: Recent Labs  Lab 09/17/20 1345 09/18/20 0533 09/19/20 0506 09/20/20 0443 09/23/20 0528  NA 131* 131* 136 138 134*  K 3.3* 3.6 3.4* 3.9 4.5  CL 96* 104 103 101 93*  CO2 _1 38*  GLUCOSE 137* 72 163* 132* 117*  BUN 33* _2 CREATININE 0.78 0.48* 0.48* 0.34* 0.46*  CALCIUM 7.9* 7.2* 7.8* 8.2* 8.3*  MG  --   --  1.8 2.0  --     GFR: Estimated Creatinine Clearance: 83.1 mL/min (A) (by C-G formula based on SCr of 0.46 mg/dL (L)). Liver Function Tests: Recent Labs  Lab 09/17/20 1345 09/18/20 0533  AST 21 15  ALT 12 9  ALKPHOS 276* 168*  BILITOT 1.0 0.8  PROT  6.7 5.0*  ALBUMIN 2.4* 1.8*    No results for input(s): LIPASE, AMYLASE in the last 168 hours. No results for input(s): AMMONIA in the last 168 hours. Coagulation Profile: Recent Labs  Lab 09/17/20 1345  INR 1.1    Cardiac Enzymes: No results for input(s): CKTOTAL, CKMB, CKMBINDEX, TROPONINI in the last 168 hours. BNP (last 3 results) No results for input(s): PROBNP in the last 8760 hours. HbA1C: No results for input(s): HGBA1C in the last 72 hours. CBG: No results for input(s): GLUCAP in the last 168 hours. Lipid Profile: No results for input(s): CHOL, HDL, LDLCALC, TRIG, CHOLHDL, LDLDIRECT in the last 72 hours. Thyroid Function Tests: No results for input(s): TSH, T4TOTAL, FREET4, T3FREE, THYROIDAB in the last 72 hours. Anemia Panel: No results for input(s): VITAMINB12, FOLATE, FERRITIN, TIBC, IRON,  RETICCTPCT in the last 72 hours. Urine analysis:    Component Value Date/Time   COLORURINE AMBER (A) 08/22/2020 1253   APPEARANCEUR HAZY (A) 08/22/2020 1253   LABSPEC 1.024 08/22/2020 1253   PHURINE 5.0 08/22/2020 [place] 08/22/2020 1253   HGBUR NEGATIVE 08/22/2020 [place] 08/22/2020 1253   KETONESUR NEGATIVE 08/22/2020 1253   PROTEINUR NEGATIVE 08/22/2020 1253   UROBILINOGEN 0.2 10/21/2012 1737   NITRITE NEGATIVE 08/22/2020 1253   LEUKOCYTESUR NEGATIVE 08/22/2020 1253   Sepsis Labs: _0 (procalcitonin:4,lacticacidven:4)  ) Recent Results (from the past 240 hour(s))  Blood culture (routine x 2)     Status: None   Collection Time: 09/17/20  1:58 PM   Specimen: BLOOD  Result Value Ref Range Status   Specimen Description BLOOD BLOOD LEFT FOREARM  Final   Special Requests   Final    BOTTLES DRAWN AEROBIC AND ANAEROBIC Blood Culture adequate volume   Culture   Final    NO GROWTH 5 DAYS Performed at [facility], [address] [place], [place] [address]    Report Status 09/22/2020 FINAL  Final  Blood culture (routine x 2)     Status: None   Collection Time: 09/17/20  1:59 PM   Specimen: BLOOD  Result Value Ref Range Status   Specimen Description BLOOD BLOOD RIGHT FOREARM  Final   Special Requests   Final    BOTTLES DRAWN AEROBIC AND ANAEROBIC Blood Culture results may not be optimal due to an excessive volume of blood received in culture bottles   Culture   Final    NO GROWTH 5 DAYS Performed at [facility], [address] [place], [place] [address]    Report Status 09/22/2020 FINAL  Final  Resp Panel by RT-PCR (Flu A&B, Covid) Nasopharyngeal Swab     Status: None   Collection Time: 09/17/20  2:14 PM   Specimen: Nasopharyngeal Swab; Nasopharyngeal(NP) swabs in vial transport medium  Result Value Ref Range Status   SARS Coronavirus 2 by RT PCR NEGATIVE NEGATIVE Final    Comment: (NOTE) SARS-CoV-2 target  nucleic acids are NOT DETECTED.  The SARS-CoV-2 RNA is generally detectable in upper respiratory specimens during the acute phase of infection. The lowest concentration of SARS-CoV-2 viral copies this assay can detect is 138 copies/mL. A negative result does not preclude SARS-Cov-2 infection and should not be used as the sole basis for treatment or other patient management decisions. A negative result may occur with  improper specimen collection/handling, submission of specimen other than nasopharyngeal swab, presence of viral mutation(s) within the areas targeted by this assay, and inadequate number of viral copies(<138 copies/mL). A negative  result must be combined with clinical observations, patient history, and epidemiological information. The expected result is Negative.  Fact Sheet for Patients:  EntrepreneurPulse.com.au  Fact Sheet for Healthcare Providers:  IncredibleEmployment.be  This test is no t yet approved or cleared by the [place] FDA and  has been authorized for detection and/or diagnosis of SARS-CoV-2 by FDA under an Emergency Use Authorization (EUA). This EUA will remain  in effect (meaning this test can be used) for the duration of the COVID-19 declaration under Section 564(b)(1) of the Act, 21 U.S.C.section 360bbb-3(b)(1), unless the authorization is terminated  or revoked sooner.       Influenza A by PCR NEGATIVE NEGATIVE Final   Influenza B by PCR NEGATIVE NEGATIVE Final    Comment: (NOTE) The Xpert Xpress SARS-CoV-2/FLU/RSV plus assay is intended as an aid in the diagnosis of influenza from Nasopharyngeal swab specimens and should not be used as a sole basis for treatment. Nasal washings and aspirates are unacceptable for Xpert Xpress SARS-CoV-2/FLU/RSV testing.  Fact Sheet for Patients: EntrepreneurPulse.com.au  Fact Sheet for Healthcare  Providers: IncredibleEmployment.be  This test is not yet approved or cleared by the [place] FDA and has been authorized for detection and/or diagnosis of SARS-CoV-2 by FDA under an Emergency Use Authorization (EUA). This EUA will remain in effect (meaning this test can be used) for the duration of the COVID-19 declaration under Section 564(b)(1) of the Act, 21 U.S.C. section 360bbb-3(b)(1), unless the authorization is terminated or revoked.  Performed at [facility], [address] [place], [place] [address]   MRSA PCR Screening     Status: Abnormal   Collection Time: 09/18/20  8:36 AM   Specimen: Nasopharyngeal  Result Value Ref Range Status   MRSA by PCR POSITIVE (A) NEGATIVE Final    Comment:        The GeneXpert MRSA Assay (FDA approved for NASAL specimens only), is one component of a comprehensive MRSA colonization surveillance program. It is not intended to diagnose MRSA infection nor to guide or monitor treatment for MRSA infections. RESULT CALLED TO, READ BACK BY AND VERIFIED WITH: Leota Jacobsen 1029 09/18/20 GM Performed at [facility], [address] [place], [place] [address]   Aspergillus Ag, BAL/Serum     Status: None   Collection Time: 09/19/20  5:05 AM   Specimen: Vein; Blood  Result Value Ref Range Status   Aspergillus Ag, BAL/Serum 0.02 0.00 - 0.49 Index Final    Comment: (NOTE) Performed At: [facility] [place], [place] [id number] Rush Farmer MD [id number]   Expectorated Sputum Assessment w Gram Stain, Rflx to Resp Cult     Status: None   Collection Time: 09/20/20  5:33 AM   Specimen: Expectorated Sputum  Result Value Ref Range Status   Specimen Description EXPECTORATED SPUTUM  Final   Special Requests NONE  Final   Sputum evaluation   Final    THIS SPECIMEN IS ACCEPTABLE FOR SPUTUM CULTURE Performed at [facility], [address] [place], [place]  [address]    Report Status 09/20/2020 FINAL  Final  Culture, Respiratory w Gram Stain     Status: None (Preliminary result)   Collection Time: 09/20/20  5:33 AM  Result Value Ref Range Status   Specimen Description   Final    EXPECTORATED SPUTUM Performed at [facility], [address] [place], [place] [address]    Special Requests   Final    NONE Reflexed from [id number] Performed  at [facility], [place], [place] [address]    Gram Stain   Final    ABUNDANT WBC PRESENT,BOTH PMN AND MONONUCLEAR FEW GRAM POSITIVE COCCI IN PAIRS IN CLUSTERS    Culture   Final    ABUNDANT STAPHYLOCOCCUS AUREUS SUSCEPTIBILITIES TO FOLLOW Performed at [facility], [place] [address], [place], [place] [address]    Report Status PENDING  Incomplete  Acid Fast Smear (AFB)     Status: None   Collection Time: 09/20/20  5:33 AM   Specimen: Sputum  Result Value Ref Range Status   AFB Specimen Processing Concentration  Final   Acid Fast Smear Negative  Final    Comment: (NOTE) Performed At: [facility] [place], [place] [id number] Rush Farmer MD [id number]    Source (AFB) EXPECTORATED SPUTUM  Final    Comment: Performed at [facility], [address] [place], [place] [address]  Anaerobic culture w Gram Stain     Status: None (Preliminary result)   Collection Time: 09/20/20  5:33 AM   Specimen: Expectorated Sputum  Result Value Ref Range Status   Specimen Description   Final    EXPECTORATED SPUTUM Performed at [facility], [address] [place], [place] [address]    Special Requests   Final    Normal Performed at [facility], [address] [place], [place] [address]    Gram Stain   Final    RARE WBC PRESENT,BOTH PMN AND MONONUCLEAR RARE GRAM POSITIVE COCCI IN PAIRS IN CLUSTERS    Culture   Final    HOLDING FOR POSSIBLE ANAEROBE Performed at [facility], [place] [address], [place],  [place] [address]    Report Status PENDING  Incomplete  Culture, fungus without smear     Status: Abnormal (Preliminary result)   Collection Time: 09/20/20  5:33 AM   Specimen: Expectorated Sputum  Result Value Ref Range Status   Specimen Description   Final    EXPECTORATED SPUTUM Performed at [facility], [address] [place], [place] [address]    Special Requests   Final    Normal Performed at [facility], [address] [place], [place] [address]    Culture CANDIDA DUBLINIENSIS (A)  Final   Report Status PENDING  Incomplete  Expectorated Sputum Assessment w Gram Stain, Rflx to Resp Cult     Status: None   Collection Time: 09/20/20  9:13 AM   Specimen: Sputum  Result Value Ref Range Status   Specimen Description SPUTUM  Final   Special Requests Normal  Final   Sputum evaluation   Final    THIS SPECIMEN IS ACCEPTABLE FOR SPUTUM CULTURE Performed at [facility], [address], [place], [place] [address]    Report Status 09/20/2020 FINAL  Final  Culture, Respiratory w Gram Stain     Status: None (Preliminary result)   Collection Time: 09/20/20  9:13 AM   Specimen: SPU  Result Value Ref Range Status   Specimen Description   Final    SPUTUM Performed at [facility], [address], [place], [place] [address]    Special Requests   Final    Normal Reflexed from [id number] Performed at [facility], [address] [place], [place] [address]    Gram Stain   Final    RARE WBC PRESENT,BOTH PMN AND MONONUCLEAR RARE GRAM POSITIVE COCCI IN PAIRS IN CLUSTERS    Culture  Final    FEW STAPHYLOCOCCUS AUREUS SUSCEPTIBILITIES PERFORMED ON PREVIOUS CULTURE WITHIN THE LAST 5 DAYS. Performed at [facility], [place] [address] [place], [place] [address]    Report Status PENDING  Incomplete         Radiology Studies: No results found.    Scheduled Meds:  Chlorhexidine Gluconate Cloth  6 each Topical Q0600   feeding supplement   237 mL Oral TID BM   guaiFENesin  600 mg Oral BID   linezolid  600 mg Oral Q12H   multivitamin with minerals  1 tablet Oral Daily   mupirocin ointment   Nasal BID   nicotine  21 mg Transdermal Daily   predniSONE  10 mg Oral BID WC   Continuous Infusions:  fluconazole (DIFLUCAN) IV Stopped (09/22/20 2000)      LOS: 6 days    Time spent: 30 minutes  This record has been created using Systems analyst. Errors have been sought and corrected,but may not always be located. Such creation errors do not reflect on the standard of care.   Lorella Nimrod, MD [facility] 09/23/2020, 7:54 AM     Please page though [place] or Epic secure chat:  For [employer], Adult nurse

## 2020-09-24 ENCOUNTER — Inpatient Hospital Stay: Payer: Medicaid Other

## 2020-09-24 ENCOUNTER — Inpatient Hospital Stay: Payer: Medicaid Other | Admitting: Internal Medicine

## 2020-09-24 DIAGNOSIS — Z7189 Other specified counseling: Secondary | ICD-10-CM | POA: Diagnosis not present

## 2020-09-24 DIAGNOSIS — J852 Abscess of lung without pneumonia: Secondary | ICD-10-CM | POA: Diagnosis not present

## 2020-09-24 DIAGNOSIS — Z515 Encounter for palliative care: Secondary | ICD-10-CM

## 2020-09-24 DIAGNOSIS — J9621 Acute and chronic respiratory failure with hypoxia: Secondary | ICD-10-CM | POA: Diagnosis not present

## 2020-09-24 DIAGNOSIS — J441 Chronic obstructive pulmonary disease with (acute) exacerbation: Secondary | ICD-10-CM | POA: Diagnosis not present

## 2020-09-24 DIAGNOSIS — J851 Abscess of lung with pneumonia: Secondary | ICD-10-CM

## 2020-09-24 LAB — CBC WITH DIFFERENTIAL/PLATELET
Abs Immature Granulocytes: 0.1 10*3/uL — ABNORMAL HIGH (ref 0.00–0.07)
Basophils Absolute: 0 10*3/uL (ref 0.0–0.1)
Basophils Relative: 1 %
Eosinophils Absolute: 0 10*3/uL (ref 0.0–0.5)
Eosinophils Relative: 1 %
HCT: 36.9 % — ABNORMAL LOW (ref 39.0–52.0)
Hemoglobin: 11.5 g/dL — ABNORMAL LOW (ref 13.0–17.0)
Lymphocytes Relative: 43 %
Lymphs Abs: 1.8 10*3/uL (ref 0.7–4.0)
MCH: 27.3 pg (ref 26.0–34.0)
MCHC: 31.2 g/dL (ref 30.0–36.0)
MCV: 87.6 fL (ref 80.0–100.0)
Metamyelocytes Relative: 2 %
Monocytes Absolute: 0.6 10*3/uL (ref 0.1–1.0)
Monocytes Relative: 15 %
Neutro Abs: 1.6 10*3/uL — ABNORMAL LOW (ref 1.7–7.7)
Neutrophils Relative %: 38 %
Platelets: 227 10*3/uL (ref 150–400)
RBC: 4.21 MIL/uL — ABNORMAL LOW (ref 4.22–5.81)
RDW: 19.9 % — ABNORMAL HIGH (ref 11.5–15.5)
Smear Review: NORMAL
WBC: 4.1 10*3/uL (ref 4.0–10.5)
nRBC: 2.2 % — ABNORMAL HIGH (ref 0.0–0.2)

## 2020-09-24 LAB — BASIC METABOLIC PANEL
Anion gap: 6 (ref 5–15)
BUN: 23 mg/dL — ABNORMAL HIGH (ref 6–20)
CO2: 35 mmol/L — ABNORMAL HIGH (ref 22–32)
Calcium: 8.3 mg/dL — ABNORMAL LOW (ref 8.9–10.3)
Chloride: 94 mmol/L — ABNORMAL LOW (ref 98–111)
Creatinine, Ser: 0.56 mg/dL — ABNORMAL LOW (ref 0.61–1.24)
GFR, Estimated: >60 mL/min (ref >60)
Glucose, Bld: 123 mg/dL — ABNORMAL HIGH (ref 70–99)
Potassium: 5 mmol/L (ref 3.5–5.1)
Sodium: 135 mmol/L (ref 135–145)

## 2020-09-24 LAB — ANAEROBIC CULTURE W GRAM STAIN: Special Requests: NORMAL

## 2020-09-24 MED ORDER — METRONIDAZOLE 500 MG PO TABS
500.0000 mg | ORAL_TABLET | Freq: Two times a day (BID) | ORAL | Status: DC
  Administered 2020-09-24 – 2020-09-28 (×8): 500 mg via ORAL
  Filled 2020-09-24 (×8): qty 1

## 2020-09-24 NOTE — Plan of Care (Signed)
Pt orientedx4, remains on 4L [place], VSS. X1 duoneb given overnight for wheezing/SOB, pt reported good relief. Adequate UO, no BM this shift. Continued hemoptysis. Fall/safety precautions in place, rounding performed.   Problem: Education: Goal: Knowledge of General Education information will improve Description: Including pain rating scale, medication(s)/side effects and non-pharmacologic comfort measures Outcome: Progressing   Problem: Health Behavior/Discharge Planning: Goal: Ability to manage health-related needs will improve Outcome: Progressing   Problem: Clinical Measurements: Goal: Ability to maintain clinical measurements within normal limits will improve Outcome: Progressing Goal: Will remain free from infection Outcome: Progressing Goal: Diagnostic test results will improve Outcome: Progressing Goal: Respiratory complications will improve Outcome: Progressing Goal: Cardiovascular complication will be avoided Outcome: Progressing   Problem: Activity: Goal: Risk for activity intolerance will decrease Outcome: Progressing   Problem: Nutrition: Goal: Adequate nutrition will be maintained Outcome: Progressing   Problem: Coping: Goal: Level of anxiety will decrease Outcome: Progressing   Problem: Elimination: Goal: Will not experience complications related to bowel motility Outcome: Progressing Goal: Will not experience complications related to urinary retention Outcome: Progressing   Problem: Pain Managment: Goal: General experience of comfort will improve Outcome: Progressing   Problem: Safety: Goal: Ability to remain free from injury will improve Outcome: Progressing   Problem: Skin Integrity: Goal: Risk for impaired skin integrity will decrease Outcome: Progressing

## 2020-09-24 NOTE — Progress Notes (Signed)
Pulmonary Medicine          Date: 09/24/2020,   MRN# [id number] Marcus [name] [birth year]     AdmissionWeight: 63.7 kg                 CurrentWeight: 59.1 kg      CHIEF COMPLAINT:      HISTORY OF PRESENT ILLNESS   60 yo M w/ hx of COPD, Feltys syndrome (enlarged spleen, low WBC count associated with RA), epigastric hernia and rheumatoid arthritis.  Patient had admission 01/2020 to [facility] [place] with findings of pneumoperitoneum he was intubated during this time, had procedure done with graham patch incision hernia repair s/p exlap. He had MRSA pneumonia 08/2020, he has been on antibiotics with doxycycline on outpatient. He came in to ER with complaints of hemoptysis.  PCCM consultation for additional evaluation and management. Patient reports having visual symptoms and seizure like activity few weeks ago. NEURO w/u on going.    09/19/20- patient seems slightly improved, his microbiology is still in process. Currently on empiric therapy.  MRSA is +.  Hemoptysis has slowed down.   09/20/20- MRSA + pneumonia/abscess, now on linozolid, bronch to r/o involvement (aspergillosis.vs malignancy etc)t, radiography has failed prior appropriate therapy.   09/22/20- patient is still hemoptysizing.  He is on room air and feels weak. No changes to medical plan. Resp cx with Candida dublisiensis have initiated duflucan IV, will ask pharm/ID team to reivew meds and narrow   09/23/20- patient is worse with increased O2 requirement, we reviewed CT and CXR together.  Broadening of regimen with vancomycin and zosyn.   09/24/20- patient is improved, he still coughs with hemoptysis.  Fungitell is negative but resp culture is + for fungus and candida.  MRSA +  09/27/20-  still spitting up blood, diflucan started for sputum positive for  Candida dublisiensis.    PAST MEDICAL HISTORY   Past Medical History:  Diagnosis Date  . COPD (chronic obstructive pulmonary disease) ([place])   . Felty syndrome  ([place])   . Hernia, epigastric   . Pancytopenia ([place])   . Seropositive rheumatoid arthritis ([place])   . Tobacco use disorder      SURGICAL HISTORY   Past Surgical History:  Procedure Laterality Date  . INCISIONAL HERNIA REPAIR  01/17/2020   Procedure: HERNIA REPAIR INCISIONAL AND Silvestre Gunner;  Surgeon: Kinsinger, Arta Bruce, MD;  Location: [facility];  Service: General;;  . LAPAROTOMY N/A 01/17/2020   Procedure: EXPLORATORY LAPAROTOMY;  Surgeon: Kieth Brightly Arta Bruce, MD;  Location: [facility];  Service: General;  Laterality: N/A;     FAMILY HISTORY   Family History  Problem Relation Age of Onset  . CAD Brother   . COPD Brother      SOCIAL HISTORY   Social History   Tobacco Use  . Smoking status: Every Day    Packs/day: 1.00    Pack years: 0.00    Types: Cigarettes  . Smokeless tobacco: Never  Substance Use Topics  . Alcohol use: No    Alcohol/week: 0.0 standard drinks  . Drug use: Yes    Types: Marijuana     MEDICATIONS    Home Medication:    Current Medication:  Current Facility-Administered Medications:  .  acetaminophen (TYLENOL) tablet 650 mg, 650 mg, Oral, Q6H PRN, 650 mg at 09/21/20 1925 **OR** acetaminophen (TYLENOL) suppository 650 mg, 650 mg, Rectal, Q6H PRN, Para Skeans, MD .  benzonatate (TESSALON) capsule 200 mg, 200 mg, Oral, BID  PRN, Athena Masse, MD, 200 mg at 09/23/20 2107 .  Chlorhexidine Gluconate Cloth 2 % PADS 6 each, 6 each, Topical, Q0600, Lorella Nimrod, MD, 6 each at 09/24/20 0600 .  chlorpheniramine-HYDROcodone (TUSSIONEX) 10-8 MG/5ML suspension 5 mL, 5 mL, Oral, Q12H PRN, Athena Masse, MD, 5 mL at 09/22/20 2034 .  feeding supplement (ENSURE ENLIVE / ENSURE PLUS) liquid 237 mL, 237 mL, Oral, TID BM, Amin, Sumayya, MD, 237 mL at 09/24/20 1400 .  guaiFENesin (MUCINEX) 12 hr tablet 600 mg, 600 mg, Oral, BID, Mansy, Jan A, MD, 600 mg at 09/24/20 1012 .  ipratropium-albuterol (DUONEB) 0.5-2.5 (3) MG/3ML nebulizer solution 3 mL, 3 mL,  Nebulization, Q4H PRN, Athena Masse, MD, 3 mL at 09/24/20 0954 .  linezolid (ZYVOX) tablet 600 mg, 600 mg, Oral, Q12H, Ravishankar, Jayashree, MD, 600 mg at 09/24/20 1012 .  metroNIDAZOLE (FLAGYL) tablet 500 mg, 500 mg, Oral, BID, Ravishankar, Jayashree, MD .  multivitamin with minerals tablet 1 tablet, 1 tablet, Oral, Daily, Lorella Nimrod, MD, 1 tablet at 09/24/20 1012 .  mupirocin ointment (BACTROBAN) 2 %, , Nasal, BID, Lorella Nimrod, MD, Given at 09/24/20 1012 .  nicotine (NICODERM CQ - dosed in mg/24 hours) patch 21 mg, 21 mg, Transdermal, Daily, Florina Ou V, MD, 21 mg at 09/24/20 1014 .  predniSONE (DELTASONE) tablet 10 mg, 10 mg, Oral, BID WC, Para Skeans, MD, 10 mg at 09/24/20 1012    ALLERGIES   Levaquin [levofloxacin in d5w] and Methotrexate     REVIEW OF SYSTEMS    Review of Systems:  Gen:  Denies  fever, sweats, chills weigh loss  HEENT: Denies blurred vision, double vision, ear pain, eye pain, hearing loss, nose bleeds, sore throat Cardiac:  No dizziness, chest pain or heaviness, chest tightness,edema Resp:   Denies cough or sputum porduction, shortness of breath,wheezing, hemoptysis,  Gi: Denies swallowing difficulty, stomach pain, nausea or vomiting, diarrhea, constipation, bowel incontinence Gu:  Denies bladder incontinence, burning urine Ext:   Denies Joint pain, stiffness or swelling Skin: Denies  skin rash, easy bruising or bleeding or hives Endoc:  Denies polyuria, polydipsia , polyphagia or weight change Psych:   Denies depression, insomnia or hallucinations   Other:  All other systems negative   VS: BP 96/63 (BP Location: Left Arm)   Pulse 87   Temp 98.5 F (36.9 C)   Resp 16   Ht 5' 5.98" (1.676 m)   Wt 59.1 kg   SpO2 97%   BMI 21.04 kg/m      PHYSICAL EXAM    GENERAL:NAD, no fevers, chills, no weakness no fatigue HEAD: Normocephalic, atraumatic.  EYES: Pupils equal, round, reactive to light. Extraocular muscles intact. No scleral  icterus.  MOUTH: Moist mucosal membrane. Dentition intact. No abscess noted.  EAR, NOSE, THROAT: Clear without exudates. No external lesions.  NECK: Supple. No thyromegaly. No nodules. No JVD.  PULMONARY: Diffuse coarse bs  CARDIOVASCULAR: S1 and S2. Regular rate and rhythm. No murmurs, rubs, or gallops. No edema. Pedal pulses 2+ bilaterally.  GASTROINTESTINAL: Soft, nontender, nondistended. No masses. Positive bowel sounds. No hepatosplenomegaly.  MUSCULOSKELETAL: No swelling, clubbing, or edema. Range of motion full in all extremities.  NEUROLOGIC: Cranial nerves II through XII are intact. No gross focal neurological deficits. Sensation intact. Reflexes intact.  SKIN: No ulceration, lesions, rashes, or cyanosis. Skin warm and dry. Turgor intact.  PSYCHIATRIC: Mood, affect within normal limits. The patient is awake, alert and oriented x 3. Insight, judgment intact.  IMAGING    DG Chest 2 View  Result Date: 09/06/2020 CLINICAL DATA:  Shortness of breath.  Cough. EXAM: CHEST - 2 VIEW COMPARISON:  Chest x-ray 08/28/2020.  CT 08/22/2020. FINDINGS: Mediastinum unremarkable. Heart size normal. Cavitary process in the right mid lung again noted. No change identified. No pleural effusion or pneumothorax. Old right rib fractures. IMPRESSION: Cavitary process in the right mid lung again noted. Cavitary process best identified by prior CT. Chest is unchanged from prior exam. Electronically Signed   By: Marcello Moores  Register   On: 09/06/2020 14:04   CT Angio Chest PE W and/or Wo Contrast  Result Date: 09/17/2020 CLINICAL DATA:  Hemoptysis, shortness of breath, abdominal distension EXAM: CT ANGIOGRAPHY CHEST CT ABDOMEN AND PELVIS WITH CONTRAST TECHNIQUE: Multidetector CT imaging of the chest was performed using the standard protocol during bolus administration of intravenous contrast. Multiplanar CT image reconstructions and MIPs were obtained to evaluate the vascular anatomy. Multidetector CT imaging of  the abdomen and pelvis was performed using the standard protocol during bolus administration of intravenous contrast. CONTRAST:  28mL OMNIPAQUE IOHEXOL 350 MG/ML SOLN COMPARISON:  CT 06/20/2020, 06/22/2020 FINDINGS: CTA CHEST FINDINGS Cardiovascular: Satisfactory opacification of the pulmonary arteries to the segmental level. No evidence of pulmonary embolism. Thoracic aorta is normal in course and caliber. Atherosclerotic calcifications of the aorta and coronary arteries. Normal heart size. No pericardial effusion. Mediastinum/Nodes: Mildly prominent mediastinal lymph nodes are again noted, likely reactive. No axillary or hilar lymphadenopathy. Lungs/Pleura: Redemonstrated cavitary mass located within the inferomedial aspect of the right upper lobe measuring approximately 9.9 x 6.6 x 7.4 cm (previously measured approximately 7.8 x 4.5 x 5.5 cm on 08/22/2020. Increased surrounding consolidation and ground-glass opacity within the adjacent right upper lobe and right middle lobe previously seen cavitary lesion within the medial aspect of the left upper lobe has resolved with small thin walled pleural cyst remaining (series 6, image 44). Scattered areas of ground-glass opacity within the bilateral lower lobes and upper lobes. Background of moderate emphysema. No pleural effusion or pneumothorax. Musculoskeletal: Remote right sixth and seventh rib fractures. No new or acute osseous findings. No chest wall abnormality. Review of the MIP images confirms the above findings. CT ABDOMEN and PELVIS FINDINGS Hepatobiliary: Cholelithiasis without evidence of pericholecystic inflammation by CT. Liver within normal limits. No biliary dilatation. Pancreas: Unremarkable. No pancreatic ductal dilatation or surrounding inflammatory changes. Spleen: Prior splenectomy. Adrenals/Urinary Tract: Unremarkable adrenal glands. Kidneys enhance symmetrically without focal lesion, stone, or hydronephrosis. Ureters are nondilated. Urinary  bladder appears unremarkable. Stomach/Bowel: Stomach is within normal limits. Appendix appears normal (series 2, image 58). Incidental note of a small bowel-small bowel intussusception within the mid left abdomen (series 2, image 38). No evidence of bowel wall thickening, distention, or inflammatory changes. Vascular/Lymphatic: Fusiform infrarenal abdominal aortic aneurysm measuring up to 3.1 cm, unchanged. Aortoiliac atherosclerosis. No abdominopelvic lymphadenopathy. Reproductive: Prostate is unremarkable. Other: No free fluid. No abdominopelvic fluid collection. No pneumoperitoneum. Fat containing midline supraumbilical hernia without evidence of complication. Musculoskeletal: No acute or significant osseous findings. Review of the MIP images confirms the above findings. IMPRESSION: 1. No evidence of acute pulmonary embolism. 2. Enlarging cavitary mass within the inferomedial aspect of the right upper lobe measuring up to 9.9 cm (previously measured approximately 7.8 x 4.5 x 5.5 cm on 08/22/2020). Increased surrounding consolidation and ground-glass opacity within the adjacent right upper lobe and right middle lobe. Findings remain most compatible with cavitary/necrotizing pneumonia. 3. Scattered areas of ground-glass opacity within the bilateral lower lobes  and upper lobes, concerning for multifocal infection. 4. Interval resolution of previously seen cavitary lesion within the medial aspect of the left upper lobe with small thin walled pleural cyst remaining. 5. Cholelithiasis without evidence of pericholecystic inflammation by CT. 6. Incidental note of a small bowel-small bowel intussusception within the mid left abdomen. No evidence of bowel obstruction. 7. Fat containing midline supraumbilical hernia without evidence of complication. 8. Stable infrarenal abdominal aortic aneurysm measuring up to 3.1 cm. Recommend follow-up ultrasound every 3 years. This recommendation follows ACR consensus guidelines: White  Paper of the ACR Incidental Findings Committee II on Vascular Findings. J Am Coll Radiol 2013; 10:789-794. Aortic Atherosclerosis (ICD10-I70.0) and Emphysema (ICD10-J43.9). Electronically Signed   By: Davina Poke D.O.   On: 09/17/2020 16:38   MR BRAIN W WO CONTRAST  Result Date: 09/18/2020 CLINICAL DATA:  Brain mass or lesion. 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 Recent MRSA pneumonia. Patient reports visual symptoms and seizure-like activity a few weeks ago. EXAM: MRI HEAD WITHOUT AND WITH CONTRAST TECHNIQUE: Multiplanar, multiecho pulse sequences of the brain and surrounding structures were obtained without and with intravenous contrast. CONTRAST:  56mL GADAVIST GADOBUTROL 1 MMOL/ML IV SOLN COMPARISON:  No pertinent prior exams available for comparison. FINDINGS: Brain: Intermittently motion degraded examination, limiting evaluation. Most notably, there is moderate/severe motion degradation of the sagittal T1 weighted sequence, moderate/severe motion degradation of the axial T2/FLAIR sequence, moderate motion degradation of the axial T1 weighted postcontrast sequence and moderate motion degradation of the coronal T1 weighted postcontrast sequence. Mild generalized cerebral and cerebellar atrophy. Advanced patchy and confluent T2/FLAIR hyperintensity within the cerebral white matter, nonspecific but most often secondary to chronic small vessel ischemia. Chronic lacunar infarct within the left lentiform nucleus. Small foci of SWI signal loss within the left basal ganglia and along the margin of the posterior right lateral ventricle, likely reflecting chronic microhemorrhages. Additional subcentimeter focus of SWI signal loss within the midline cerebellum, which may reflect an additional chronic microhemorrhage or cavernoma. There is no acute infarct. No evidence of intracranial mass. No extra-axial fluid collection. No midline shift.  Within the limitations of motion degradation, no abnormal intracranial enhancement is identified. Vascular: Expected proximal arterial flow voids. Non dominant intracranial right vertebral artery. Skull and upper cervical spine: Within the limitations of motion degradation, no focal marrow lesion is identified. Sinuses/Orbits: Visualized orbits show no acute finding. 1.9 cm right maxillary sinus mucous retention cyst. Trace left maxillary sinus mucosal thickening. Other: Trace bilateral mastoid effusions. 2.8 cm ovoid lesion within the left suboccipital scalp demonstrating heterogeneous but predominantly T2 hyperintense signal, T1 hypointense signal, restricted diffusion and no convincing abnormal enhancement. This is favored to reflect a sebaceous/epidermoid cyst. IMPRESSION: Motion degraded examination, as described and limiting evaluation. No evidence of acute intracranial abnormality. Severe patchy and confluent T2/FLAIR hyperintensity within the cerebral white matter, nonspecific but most often secondary to chronic small vessel ischemia. Chronic left basal ganglia lacunar infarct. Subcentimeter focus of SWI signal loss within the midline cerebellum, which may reflect a chronic microhemorrhage or cavernoma. Mild generalized parenchymal atrophy. Incidentally noted 1.9 cm right maxillary sinus mucous retention cyst. Trace bilateral mastoid effusions. Electronically Signed   By: Kellie Simmering DO   On: 09/18/2020 15:59   MR CERVICAL SPINE W WO CONTRAST  Result Date: 09/19/2020 CLINICAL DATA:  Acute or progressive myelopathy. EXAM: MRI CERVICAL SPINE WITHOUT AND WITH CONTRAST TECHNIQUE: Multiplanar and multiecho pulse sequences of the cervical spine, to include  the craniocervical junction and cervicothoracic junction, were obtained without and with intravenous contrast. CONTRAST:  4mL GADAVIST GADOBUTROL 1 MMOL/ML IV SOLN COMPARISON:  No previous cervical imaging. FINDINGS: Alignment: Straightening of the normal  cervical lordosis. 1 mm degenerative anterolisthesis C7-T1. Vertebrae: No fracture or focal bone lesion. Cord: No primary cord lesion.  See below regarding stenosis. Posterior Fossa, vertebral arteries, paraspinal tissues: Large sebaceous cyst of the upper left neck. Disc levels: Foramen magnum is widely patent.  C1-2 and C2-3 are normal. C3-4: Endplate osteophytes and bulging of the disc. Canal stenosis with AP diameter in the midline 6 mm. Effacement of the subarachnoid space and slight indentation of the cord. Moderate bilateral foraminal narrowing. C4-5: Endplate osteophytes and protrusion of the disc more prominent towards the left. Effacement of the subarachnoid space and cord deformity, worse on the left. AP diameter of the canal 5.9 mm. Bilateral foraminal stenosis. C5-6: Spondylosis with endplate osteophytes and bulging of the disc. Canal narrowing with AP diameter in the midline 8.3 mm. No compressive effect upon the cord. Bilateral foraminal narrowing. C6-7: Mild bulging of the disc. No canal stenosis. Foraminal narrowing on the right that could affect the exiting C7 nerve. C7-T1: Facet osteoarthritis with 1 mm of anterolisthesis. No stenosis of the canal or foramina. 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 AP diameter of the canal at C3-4 6 mm. AP diameter of the spinal canal at C4-5 5.9 mm. Effacement of the subarachnoid space with some indentation of the cord. No abnormal cord T2 signal however. Foraminal stenosis that could cause neural compression on either side at C3-4, either side at C4-5, either side at C5-6 and on the right C6-7. Electronically Signed   By: Nelson Chimes M.D.   On: 09/19/2020 18:01   MR THORACIC SPINE W WO CONTRAST  Result Date: 09/19/2020 CLINICAL DATA:  Acute or progressive myelopathy. EXAM: MRI THORACIC WITHOUT AND WITH CONTRAST TECHNIQUE: Multiplanar and multiecho pulse sequences of the thoracic spine were obtained without and with  intravenous contrast. CONTRAST:  51mL GADAVIST GADOBUTROL 1 MMOL/ML IV SOLN COMPARISON:  Cervical study same day FINDINGS: Alignment:  Normal Vertebrae: No fracture or focal bone lesion. Cord: No cord compression or primary cord lesion. Spinal canal widely patent throughout the region. No evidence of arteriovenous malformation. Paraspinal and other soft tissues: Negative Disc levels: No significant disc pathology in the thoracic region. Wide patency of the canal and foramina. Ordinary mild facet osteoarthritis without edema or enhancement. No encroachment upon the neural spaces. IMPRESSION: Negative thoracic study. No cord compression or cord lesion. No significant degenerative changes. Electronically Signed   By: Nelson Chimes M.D.   On: 09/19/2020 18:03   CT ABDOMEN PELVIS W CONTRAST  Result Date: 09/17/2020 CLINICAL DATA:  Hemoptysis, shortness of breath, abdominal distension EXAM: CT ANGIOGRAPHY CHEST CT ABDOMEN AND PELVIS WITH CONTRAST TECHNIQUE: Multidetector CT imaging of the chest was performed using the standard protocol during bolus administration of intravenous contrast. Multiplanar CT image reconstructions and MIPs were obtained to evaluate the vascular anatomy. Multidetector CT imaging of the abdomen and pelvis was performed using the standard protocol during bolus administration of intravenous contrast. CONTRAST:  22mL OMNIPAQUE IOHEXOL 350 MG/ML SOLN COMPARISON:  CT 06/20/2020, 06/22/2020 FINDINGS: CTA CHEST FINDINGS Cardiovascular: Satisfactory opacification of the pulmonary arteries to the segmental level. No evidence of pulmonary embolism. Thoracic aorta is normal in course and caliber. Atherosclerotic calcifications of the aorta and coronary arteries. Normal heart size. No pericardial effusion. Mediastinum/Nodes: Mildly prominent mediastinal  lymph nodes are again noted, likely reactive. No axillary or hilar lymphadenopathy. Lungs/Pleura: Redemonstrated cavitary mass located within the  inferomedial aspect of the right upper lobe measuring approximately 9.9 x 6.6 x 7.4 cm (previously measured approximately 7.8 x 4.5 x 5.5 cm on 08/22/2020. Increased surrounding consolidation and ground-glass opacity within the adjacent right upper lobe and right middle lobe previously seen cavitary lesion within the medial aspect of the left upper lobe has resolved with small thin walled pleural cyst remaining (series 6, image 44). Scattered areas of ground-glass opacity within the bilateral lower lobes and upper lobes. Background of moderate emphysema. No pleural effusion or pneumothorax. Musculoskeletal: Remote right sixth and seventh rib fractures. No new or acute osseous findings. No chest wall abnormality. Review of the MIP images confirms the above findings. CT ABDOMEN and PELVIS FINDINGS Hepatobiliary: Cholelithiasis without evidence of pericholecystic inflammation by CT. Liver within normal limits. No biliary dilatation. Pancreas: Unremarkable. No pancreatic ductal dilatation or surrounding inflammatory changes. Spleen: Prior splenectomy. Adrenals/Urinary Tract: Unremarkable adrenal glands. Kidneys enhance symmetrically without focal lesion, stone, or hydronephrosis. Ureters are nondilated. Urinary bladder appears unremarkable. Stomach/Bowel: Stomach is within normal limits. Appendix appears normal (series 2, image 58). Incidental note of a small bowel-small bowel intussusception within the mid left abdomen (series 2, image 38). No evidence of bowel wall thickening, distention, or inflammatory changes. Vascular/Lymphatic: Fusiform infrarenal abdominal aortic aneurysm measuring up to 3.1 cm, unchanged. Aortoiliac atherosclerosis. No abdominopelvic lymphadenopathy. Reproductive: Prostate is unremarkable. Other: No free fluid. No abdominopelvic fluid collection. No pneumoperitoneum. Fat containing midline supraumbilical hernia without evidence of complication. Musculoskeletal: No acute or significant osseous  findings. Review of the MIP images confirms the above findings. IMPRESSION: 1. No evidence of acute pulmonary embolism. 2. Enlarging cavitary mass within the inferomedial aspect of the right upper lobe measuring up to 9.9 cm (previously measured approximately 7.8 x 4.5 x 5.5 cm on 08/22/2020). Increased surrounding consolidation and ground-glass opacity within the adjacent right upper lobe and right middle lobe. Findings remain most compatible with cavitary/necrotizing pneumonia. 3. Scattered areas of ground-glass opacity within the bilateral lower lobes and upper lobes, concerning for multifocal infection. 4. Interval resolution of previously seen cavitary lesion within the medial aspect of the left upper lobe with small thin walled pleural cyst remaining. 5. Cholelithiasis without evidence of pericholecystic inflammation by CT. 6. Incidental note of a small bowel-small bowel intussusception within the mid left abdomen. No evidence of bowel obstruction. 7. Fat containing midline supraumbilical hernia without evidence of complication. 8. Stable infrarenal abdominal aortic aneurysm measuring up to 3.1 cm. Recommend follow-up ultrasound every 3 years. This recommendation follows ACR consensus guidelines: White Paper of the ACR Incidental Findings Committee II on Vascular Findings. J Am Coll Radiol 2013; 10:789-794. Aortic Atherosclerosis (ICD10-I70.0) and Emphysema (ICD10-J43.9). Electronically Signed   By: Davina Poke D.O.   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 There is increase in consolidation in this area compared to most recent study. Lungs elsewhere clear. Heart size and pulmonary vascular normal. No adenopathy appreciable. No  bone lesions. IMPRESSION: Cavitary mass right middle lobe anteriorly with increase in surrounding consolidation compared to most recent study. The lungs elsewhere are clear. Heart size normal. No adenopathy appreciable by  radiography. Electronically Signed   By: Lowella Grip III M.D.   On: 09/17/2020 13:54   DG Chest Port 1 View  Result Date: 08/28/2020 CLINICAL DATA:  Hypoxia. EXAM: PORTABLE CHEST 1 VIEW COMPARISON:  CT 08/22/2020.  Chest x-ray 08/22/2020. FINDINGS: Mediastinum hilar structures normal. Heart size normal. Cavitary process in the right mid lung and left upper lung best identified by prior CT. No new findings are noted. No pleural effusion or pneumothorax. Heart size stable. Degenerative change thoracic spine. Old right rib fractures present. IMPRESSION: Cavitary process in the right mid lung and left upper lung best identified by prior CT. Chest is unchanged from prior exam. Electronically Signed   By: Marcello Moores  Register   On: 08/28/2020 08:25   DG Swallowing Func-Speech Pathology  Result Date: 09/06/2020 Please refer to "Notes" tab for Speech Pathology notes.  ECHOCARDIOGRAM COMPLETE  Result Date: 08/28/2020    ECHOCARDIOGRAM REPORT   Patient Name:   Marcus [name] [facility] Date of Exam: 08/28/2020 Medical Rec #:  [id number]    Height:       66.0 in Accession #:    [id number]   Weight:       133.6 lb Date of Birth:  10/06/1960     BSA:          1.685 m Patient Age:    17 years     BP:           120/74 mmHg Patient Gender: M            HR:           76 bpm. 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 COPD. Tobacco use disorder.  Sonographer:     Sherrie Sport RDCS (AE) Referring Phys:  [id number] Sharen Hones Diagnosing Phys: Nelva Bush MD  Sonographer Comments: Technically challenging study due to limited acoustic windows.  Image acquisition challenging due to COPD. IMPRESSIONS  1. Left ventricular ejection fraction, by estimation, is 55 to 60%. The left ventricle has normal function. Left ventricular endocardial border not optimally defined to evaluate regional wall motion. Left ventricular diastolic parameters are consistent with Grade I diastolic dysfunction (impaired relaxation).  2. Right ventricular systolic function is normal. The right ventricular size is mildly enlarged. Mildly increased right ventricular wall thickness. There is normal pulmonary artery systolic pressure.  3. The mitral valve is degenerative. Trivial mitral valve regurgitation. No evidence of mitral stenosis.  4. The aortic valve was not well visualized. Aortic valve regurgitation is not visualized. No aortic stenosis is present.  5. Pulmonic valve regurgitation not well assessed.  6. The inferior vena cava is normal in size with greater than 50% respiratory variability, suggesting right atrial pressure of 3 mmHg. Conclusion(s)/Recommendation(s): Valves suboptimally imaged to exclude endocarditis. If clinical concern persists, transesophageal echocardiogram should be considered. FINDINGS  Left Ventricle: Left ventricular ejection fraction, by estimation, is 55 to 60%. The left ventricle has normal function. Left ventricular endocardial border not optimally defined to evaluate regional wall motion. The left ventricular internal cavity size was normal in size. There is no left ventricular hypertrophy. Left ventricular diastolic parameters are consistent with Grade I diastolic dysfunction (impaired relaxation). Right Ventricle: The right ventricular size is mildly enlarged.  Mildly increased right ventricular wall thickness. Right ventricular systolic function is normal. There is normal pulmonary artery systolic pressure. The tricuspid regurgitant velocity is 1.72 m/s, and with an assumed right atrial pressure of 3 mmHg, the estimated right ventricular systolic  pressure is 70.6 mmHg. Left Atrium: Left atrial size was normal in size. Right Atrium: Right atrial size was normal in size. Pericardium: There is no evidence of pericardial effusion. Mitral Valve: The mitral valve is degenerative in appearance. There is mild thickening of the mitral valve leaflet(s). There is mild calcification of the mitral valve leaflet(s). Mild mitral annular calcification. Trivial mitral valve regurgitation. No evidence of mitral valve stenosis. Tricuspid Valve: The tricuspid valve is grossly normal. Tricuspid valve regurgitation is trivial. Aortic Valve: The aortic valve was not well visualized. Aortic valve regurgitation is not visualized. No aortic stenosis is present. Aortic valve mean gradient measures 3.0 mmHg. Aortic valve peak gradient measures 4.8 mmHg. Aortic valve area, by VTI measures 2.04 cm. Pulmonic Valve: The pulmonic valve was not well visualized. Pulmonic valve regurgitation not well assessed. Aorta: The aortic root is normal in size and structure. Pulmonary Artery: The pulmonary artery is not well seen. Venous: The inferior vena cava is normal in size with greater than 50% respiratory variability, suggesting right atrial pressure of 3 mmHg. IAS/Shunts: The interatrial septum was not well visualized.  LEFT VENTRICLE PLAX 2D LVIDd:         5.38 cm  Diastology LVIDs:         2.94 cm  LV e' medial:    6.09 cm/s LV PW:         1.02 cm  LV E/e' medial:  10.3 LV IVS:        0.73 cm  LV e' lateral:   11.40 cm/s LVOT diam:     2.10 cm  LV E/e' lateral: 5.5 LV SV:         45 LV SV Index:   27 LVOT Area:     3.46 cm  RIGHT VENTRICLE RV Basal diam:  4.23 cm RV S prime:     9.46 cm/s TAPSE (M-mode): 2.0 cm LEFT ATRIUM             Index       RIGHT ATRIUM           Index LA diam:        3.50 cm 2.08 cm/m  RA Area:     16.50 cm LA Vol (A2C):   30.0 ml 17.81 ml/m RA Volume:   44.10 ml  26.18 ml/m LA Vol (A4C):   39.3 ml 23.33 ml/m LA Biplane Vol: 35.6 ml 21.13 ml/m  AORTIC VALVE                    PULMONIC VALVE AV Area (Vmax):    1.89 cm    PV Vmax:        0.88 m/s AV Area (Vmean):   2.10 cm    PV Peak grad:   3.1 mmHg AV Area (VTI):     2.04 cm    RVOT Peak grad: 3 mmHg AV Vmax:           110.00 cm/s AV Vmean:          74.000 cm/s AV VTI:            0.219 m AV Peak Grad:      4.8 mmHg AV Mean Grad:      3.0 mmHg LVOT  Vmax:         60.10 cm/s LVOT Vmean:        44.800 cm/s LVOT VTI:          0.129 m LVOT/AV VTI ratio: 0.59  AORTA Ao Root diam: 3.60 cm MITRAL VALVE               TRICUSPID VALVE MV Area (PHT): 4.86 cm    TR Peak grad:   11.8 mmHg MV Decel Time: 156 msec    TR Vmax:        172.00 cm/s MV E velocity: 62.60 cm/s MV A velocity: 86.50 cm/s  SHUNTS MV E/A ratio:  0.72        Systemic VTI:  0.13 m                            Systemic Diam: 2.10 cm Nelva Bush MD Electronically signed by Nelva Bush MD Signature Date/Time: 08/28/2020/9:56:00 AM    Final       ASSESSMENT/PLAN   Presented in respiratory failure, non massive hemoptysis,  enlarging RUL density with crescent air sign. 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 Meds adjusted. On linezolid.The hemoptysis is from the infection (on going necrosis). His neutropenia improved on Granix.  Heme involved. Candida in sputum ? Oral Colonization, relatively immunocompromised. Diflucan started aspergillosis studies negative, fungitell negative -continue present antibiotic regimen -incentive spirometry -possible bronch to r/o other causes when stable  andif he is failing antibiotics ( relative contraindication doing a bronch in some one with a potential abscess) -may need t -surg input regarding surgical intervention to aid in the control if the rul infection -cxr in am -Further orders per above       Thank you for allowing me to participate in the care of this patient.   Patient/Family are satisfied with care plan and all questions have been answered.  This document was prepared using Dragon voice recognition  software and may include unintentional dictation errors.     Wallene Huh, M.D.  Division of [place]

## 2020-09-24 NOTE — Progress Notes (Signed)
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  He was here last month with similar and returns with increased coughing with bright red blood production. CT scan showed cavitary lesion in the right upper lobe.  Suspect lung abscess. Pulminology following. h/o hypothermic during hospitalizations (thought to be 2/2 relative adrenal insufficiency.)    PT Comments    Pt seen for PT tx with pt on 4L/min at rest, increased to 6L/min with gait. Pt is able to complete bed mobility & sit<>stand transfers with mod I. Pt ambulates 1 lap around nurses station with RW & supervision with decreased gait speed & forward trunk lean onto RW. Pt denies fatigue with gait but once in room declines exercises 2/2 fatigue. Continue to recommend STR upon d/c to maximize independence and reduce fall risk prior to return home with elderly mother.  SpO2 88% on 4L/min with gait, >90% once turned up to 6L/min; PT provides education re: pursed lip breathing HR up to 134 bpm with gait, down to 98 bpm sitting in recliner after activity - nurse made aware  Follow Up Recommendations  SNF     Equipment Recommendations  Rolling walker with 5" wheels;3in1 (PT)    Recommendations for Other Services       Precautions / Restrictions Precautions Precautions: Fall Restrictions Weight Bearing Restrictions: No    Mobility  Bed Mobility   Bed Mobility: Supine to Sit   Sidelying to sit: Modified independent (Device/Increase time);HOB elevated            Transfers Overall transfer level: Modified independent Equipment used: Rolling walker (2 wheeled) Transfers: Sit to/from Stand Sit to Stand: Modified independent (Device/Increase time)             Ambulation/Gait Ambulation/Gait assistance: Supervision Gait Distance (Feet): 165 Feet Assistive device: Rolling walker (2 wheeled) Gait Pattern/deviations: Decreased step length - right;Decreased step length - left;Decreased stride length;Trunk flexed Gait velocity: decreased   General Gait Details: forward trunk lean onto RW   Stairs             Wheelchair Mobility    Modified Rankin (Stroke Patients Only)       Balance Overall balance assessment: Needs assistance Sitting-balance support: Bilateral upper extremity supported;Feet supported Sitting balance-Leahy Scale: Good     Standing balance support: Bilateral upper extremity supported;During functional activity Standing balance-Leahy Scale: Fair                              Cognition Arousal/Alertness: Awake/alert Behavior During Therapy: Flat affect Overall Cognitive Status: Within Functional Limits for tasks assessed                                        Exercises      General Comments        Pertinent Vitals/Pain Faces Pain Scale: Hurts a little bit Pain Location: generalized pain Pain Intervention(s): Monitored during session    Home Living                      Prior Function  PT Goals (current goals can now be found in the care plan section) Acute Rehab PT Goals Patient Stated Goal: go home PT Goal Formulation: With patient Time For Goal Achievement: 10/05/20 Potential to Achieve Goals: Fair Progress towards PT goals: Progressing toward goals    Frequency    Min 2X/week      PT Plan Current plan remains appropriate    Co-evaluation              AM-PAC PT "6 Clicks" Mobility   Outcome Measure  Help needed turning from your back to your side while in a flat bed without using bedrails?: None Help needed moving from lying on your back to sitting on the side of a flat bed without using bedrails?: A Little Help needed  moving to and from a bed to a chair (including a wheelchair)?: A Little Help needed standing up from a chair using your arms (e.g., wheelchair or bedside chair)?: None Help needed to walk in hospital room?: A Little Help needed climbing 3-5 steps with a railing? : A Little 6 Click Score: 20    End of Session Equipment Utilized During Treatment: Gait belt;Oxygen Activity Tolerance: Patient tolerated treatment well Patient left: in chair;with call bell/phone within reach Nurse Communication: Mobility status PT Visit Diagnosis: Unsteadiness on feet (R26.81);Muscle weakness (generalized) (M62.81);Difficulty in walking, not elsewhere classified (R26.2)     Time: 1115-1130 PT Time Calculation (min) (ACUTE ONLY): 15 min  Charges:  $Therapeutic Activity: 8-22 mins                     Lavone Nian, PT, DPT 09/24/20, 11:41 AM    Waunita Schooner 09/24/2020, 11:39 AM

## 2020-09-24 NOTE — Consult Note (Signed)
Consultation Note Date: 09/24/2020   Patient Name: Marcus [name]  DOB: 15-Jul-1960  MRN: [id number]  Age / Sex: 60 y.o., male  PCP: [name], FNP Referring Physician: Lorella Nimrod, MD  Reason for Consultation: Establishing goals of care  HPI/Patient Profile: Marcus [name] is a 60 y.o. male who presents to the ED with coughing up blood, shortness of breath, cough, and abdominal pain. Patient reports the coughing of blood began past 2 to 3 days .  Clinical Assessment and Goals of Care: Patient is resting in bed with brother at bedside. He states he is not married and does not have children. He states he lives with his 12 year old mother. He states she cooks and cleans and he drives.   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 He states the RA came at that time as well. He states he has had ups and downs, but has slowly declined since then. At baseline, he tells me he is independent.   We discussed his diagnosis, prognosis, and GOC.  Created space and opportunity for patient  to explore thoughts and feelings regarding current medical information.   A detailed discussion was had today regarding advanced directives.  Concepts specific to code status, artifical feeding and hydration, IV antibiotics and rehospitalization were discussed.  The difference between an aggressive medical intervention path and a comfort care path was discussed.  Values and goals of care important to patient and family were attempted to be elicited.  Discussed limitations of medical interventions to prolong quality of life in some situations and discussed the concept of human mortality. Discussed QOL.  He states  for the foreseeable future he wants all care that can be provided with no limits.      SUMMARY OF RECOMMENDATIONS    Full code/full scope.   Prognosis:  Poor overall       Primary Diagnoses: Present on Admission:  Acute on chronic respiratory failure with hypoxia (HCC)  Sepsis due to undetermined organism (HCC)  Seropositive rheumatoid arthritis (HCC)  Hypokalemia  Chronic obstructive pulmonary disease (HCC)  Hemoptysis  Chronic diastolic CHF (congestive heart failure) ([place])  Lung abscess ([place])  Acquired neutropenia (HCC)   I have reviewed the medical record, interviewed the patient and family, and examined the patient. The following aspects are pertinent.  Past Medical History:  Diagnosis Date   COPD (chronic obstructive pulmonary disease) (HCC)    Felty syndrome (HCC)    Hernia, epigastric    Pancytopenia (HCC)    Seropositive rheumatoid arthritis (HCC)    Tobacco use disorder    Social History   Socioeconomic History   Marital status: Single    Spouse name: Not on file   Number of children: Not on file   Years of education: Not on file   Highest education level: Not on file  Occupational History   Not on file  Tobacco Use   Smoking status: Every Day    Packs/day: 1.00  Pack years: 0.00    Types: Cigarettes   Smokeless tobacco: Never  Substance and Sexual Activity   Alcohol use: No    Alcohol/week: 0.0 standard drinks   Drug use: Yes    Types: Marijuana   Sexual activity: Not on file  Other Topics Concern   Not on file  Social History Narrative   Lives at home with Mother. Ambulates independently.   Social Determinants of Health   Financial Resource Strain: Not on file  Food Insecurity: Not on file  Transportation Needs: Not on file  Physical Activity: Not on file  Stress: Not on file  Social Connections: Not on file   Family History  Problem Relation Age of Onset   CAD Brother    COPD Brother    Scheduled Meds:  Chlorhexidine Gluconate Cloth  6 each Topical Q0600   feeding supplement  237 mL Oral TID BM   guaiFENesin  600 mg Oral BID   linezolid  600 mg Oral Q12H   multivitamin with minerals  1 tablet Oral  Daily   mupirocin ointment   Nasal BID   nicotine  21 mg Transdermal Daily   predniSONE  10 mg Oral BID WC   Continuous Infusions:  fluconazole (DIFLUCAN) IV Stopped (09/23/20 1955)   PRN Meds:.acetaminophen **OR** acetaminophen, benzonatate, chlorpheniramine-HYDROcodone, ipratropium-albuterol Medications Prior to Admission:  Prior to Admission medications   Medication Sig Start Date End Date Taking? Authorizing Provider  albuterol (PROVENTIL) (2.5 MG/3ML) 0.083% nebulizer solution Take 3 mLs (2.5 mg total) by nebulization every 6 (six) hours as needed for wheezing or shortness of breath. 08/30/20  Yes Kathie Dike, MD  albuterol (VENTOLIN HFA) 108 (90 Base) MCG/ACT inhaler Inhale 2 puffs into the lungs every 6 (six) hours as needed for wheezing or shortness of breath. 08/30/20  Yes Kathie Dike, MD  doxycycline (VIBRA-TABS) 100 MG tablet Take 1 tablet (100 mg total) by mouth every 12 (twelve) hours. 08/30/20  Yes Kathie Dike, MD  predniSONE (DELTASONE) 10 MG tablet Take 1 tablet (10 mg total) by mouth 2 (two) times daily with a meal. 08/30/20  Yes Kathie Dike, MD  feeding supplement (ENSURE ENLIVE / ENSURE PLUS) LIQD Take 237 mLs by mouth 2 (two) times daily between meals. 08/04/20   Manuella Ghazi, Pratik D, DO  guaiFENesin-dextromethorphan (ROBITUSSIN DM) 100-10 MG/5ML syrup Take 5 mLs by mouth every 4 (four) hours as needed for cough. Patient not taking: No sig reported 07/01/20   Manuella Ghazi, Pratik D, DO  mometasone-formoterol (DULERA) 100-5 MCG/ACT AERO Inhale 2 puffs into the lungs 2 (two) times daily. Patient not taking: Reported on 09/17/2020 08/30/20   Kathie Dike, MD  pantoprazole (PROTONIX) 40 MG tablet Take 1 tablet (40 mg total) by mouth daily for 10 days. Patient not taking: Reported on 09/17/2020 07/02/20 10/04/20  Heath Lark D, DO   Allergies  Allergen Reactions   Levaquin [Levofloxacin In D5w] Other (See Comments)    Chest pain   Methotrexate Other (See Comments)    Chest pain    Review of Systems  Respiratory:  Positive for shortness of breath.    Physical Exam Pulmonary:     Effort: Pulmonary effort is normal.  Neurological:     Mental Status: He is alert.    Vital Signs: BP 96/63 (BP Location: Left Arm)   Pulse 87   Temp 98.5 F (36.9 C)   Resp 16   Ht 5' 5.98" (1.676 m)   Wt 59.1 kg   SpO2 97%   BMI  21.04 kg/m  Pain Scale: 0-10   Pain Score: 0-No pain   SpO2: SpO2: 97 % O2 Device:SpO2: 97 % O2 Flow Rate: .O2 Flow Rate (L/min): 4 L/min  IO: Intake/output summary:  Intake/Output Summary (Last 24 hours) at 09/24/2020 1441 Last data filed at 09/24/2020 0455 Gross per 24 hour  Intake 636 ml  Output 1950 ml  Net -1314 ml    LBM: Last BM Date: 09/23/20 Baseline Weight: Weight: 63.7 kg Most recent weight: Weight: 59.1 kg       Time In: 2:10 Time Out: 2:45 Time Total: 35 min Greater than 50%  of this time was spent counseling and coordinating care related to the above assessment and plan.  Signed by: Asencion Gowda, NP   Please contact Palliative Medicine Team phone at [phone] for questions and concerns.  For individual provider: See Shea Evans

## 2020-09-24 NOTE — Progress Notes (Signed)
[facility] PROGRESS NOTE    RENDER [name]  XWR:[id number] DOB: 1960/10/06 DOA: 09/17/2020 PCP: [name], FNP    Brief Narrative:  Marcus [name] is a 60 y.o. M with RA c/b Felty syndrome/neutropenia, smoking, and COPD as well as recently diagnosed MRSA abscess who presents with failure of outpatient treamtent.  Patient admitted last month for 8 days for cough and shortness of breath as well as hemoptysis found to have a large cavitary lesion in the right upper lobe concerning for lung abscess.  Sputum was positive for MRSA and so he was discharged on 6 weeks of oral doxycycline.  He reports adherence to his medication after some initial improvement with IV vancomycin in the hospital, he then started to get persistently worse, finally was coughing up blood and had chest pain and malaise/fatigue, so he came to the ER.  He was started on linezolid by ID, continue to have some hemoptysis, pulmonology is recommending bronchoscopy and a possible surgery.  EEG was negative for any seizure-like activity.  Imaging with concern of spinal stenosis but no emergent need for any intervention.  Neurology is recommending outpatient neurosurgery evaluation once improved from current hospitalization.  If patient developed any incontinence or worsening focal weakness then neurosurgery should be consulted urgently.  Patient was started on Granix due to worsening neutropenia , received 2 doses which resulted in markedly elevated leukocytosis and neutrophils-discontinue today after talking with hematology.   Resp cx with Candida dublisiensis-pulmonology is started him on Diflucan. Fungitell was negative. Patient slowly improving  Assessment & Plan:  Acute on chronic respiratory failure with hypoxia.  Improved, currently seems to be around his baseline. At baseline he is on 2 L.  Currently on 3L of oxygen. -Continue with incentive spirometry and flutter valve -Continue with supplemental  oxygen  Necrotizing pneumonia causing sepsis.  MRSA positive and Candida positive. Presented with tachycardia, tachypnea, leukopenia, and blood pressure less than 100. Initially received vancomycin, cefepime and Flagyl later transitioned to linezolid by ID Pulmonology is recommending bronchoscopy, multiple labs including Fungitell is pending, concern of aspergilloma, might need bronchoscopy or a surgery.  Resp cx with Candida dublisiensis.  Fungitell was negative -Diflucan was started yesterday -Continue with linezolid  Neutropenia.  Noted during last hospitalization.  He was started on Granix for 3 doses by hematology, received 2 doses which resulted in significant increase in leukocytosis and neutrophils, Granix was discontinued after talking with hematology. 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  Resolved -Replete potassium as needed and monitor  Shakiness.  There was some witnessed shakiness, neurology was consulted for concern of seizure-like activity.  No shakiness or any other seizure noted.  EEG normal. Neurology signed off and they are recommending outpatient neurosurgery evaluation for some spinal stenosis, no emergent need.  If patient developed any new focal weakness or incontinence then they can be reconsulted and at that point he will need emergent neurosurgery evaluation. -Continue to monitor  Chronic diastolic CHF Euvolemic  Intussusception This is an apparently incidental finding, no evidence of small bowel obstruction, belly exam benign - Advance diet as tolerated -Stop PPI   Anemia of chronic disease Hgb stable with some improvement at 10.9 today in setting of mild hemoptysis -Trend Hgb -Monitor for  ongoing  bleeding  Hyponatremia.  Resolved.   Smoking Cessation recommended, modalities discussed  Severe protein calorie malnutrition As evidenced by severe loss of subcutaneous muscle mass and fat, chronic infection, smoking -Consutl dietitian  Disposition: Status is: Inpatient  Remains inpatient appropriate because: protect  failed outpatient therapy, still on higher than baseline O2, requires ongoing diagnostic testing perhaps as dictated by Pulmonogloy  Dispo: The patient is from: Home              Anticipated d/c is to: SNF              Patient currently is not medically stable to d/c.   Difficult to place patient No              Level of care: Med-Surg  MDM: The below labs and imaging reports were reviewed and summarized above.  Medication management as above.  This is a severe threat to life and bodily function    DVT prophylaxis:   SCDs.  Code Status: Full code Family Communication: Discussed with patient.  Subjective: Patient seen and examined today.  Getting breathing treatment.  Continue to have some cough and hemoptysis.  He was asking about going home.  Appetite improving.  Objective: Vitals:   09/23/20 1613 09/23/20 2028 09/24/20 0024 09/24/20 0437  BP: 117/78 121/74 117/73 123/79  Pulse: 96 90 81 70  Resp: _0 Temp: 97.7 F (36.5 C) 97.9 F (36.6 C) 97.6 F (36.4 C) (!) 97.4 F (36.3 C)  TempSrc:      SpO2: 97% 96% 98% 97%  Weight:      Height:        Intake/Output Summary (Last 24 hours) at 09/24/2020 0743 Last data filed at 09/24/2020 0455 Gross per 24 hour  Intake 636 ml  Output 1950 ml  Net -1314 ml    Filed Weights   09/21/20 0428 09/22/20 0500 09/23/20 0512  Weight: 60.1 kg 59.4 kg 59.1 kg    Examination:  General.  Frail, chronically ill-appearing gentleman, in no acute distress. Pulmonary.  Lungs clear bilaterally, normal respiratory effort. CV.  Regular rate and rhythm, no JVD, rub or murmur. Abdomen.  Soft,  nontender, nondistended, BS positive. CNS.  Alert and oriented .  No focal neurologic deficit. Extremities.  No edema, no cyanosis, pulses intact and symmetrical. Psychiatry.  Judgment and insight appears normal.   Data Reviewed: I have personally reviewed following labs and imaging studies:  CBC: Recent Labs  Lab 09/20/20 0443 09/21/20 0405 09/22/20 0518 09/23/20 0528 09/24/20 0702  WBC 1.5* 8.3 42.9* 17.2* 4.1  NEUTROABS 0.5* 5.9 37.0* 14.0* PENDING  HGB 10.0* 10.1* 10.9* 11.4* 11.5*  HCT 31.3* 31.9* 33.8* 36.5* 36.9*  MCV 84.8 87.2 86.9 89.7 87.6  PLT 262 238 197 200 163    Basic Metabolic Panel: Recent Labs  Lab 09/17/20 1345 09/18/20 0533 09/19/20 0506 09/20/20 0443 09/23/20 0528  NA 131* 131* 136 138 134*  K 3.3* 3.6 3.4* 3.9 4.5  CL 96* 104 103 101 93*  CO2 _1 38*  GLUCOSE 137* 72 163* 132* 117*  BUN 33* _2 CREATININE 0.78 0.48* 0.48* 0.34* 0.46*  CALCIUM 7.9* 7.2* 7.8* 8.2* 8.3*  MG  --   --  1.8 2.0  --     GFR: Estimated Creatinine Clearance: 83.1 mL/min (A) (by C-G formula based on SCr of 0.46 mg/dL (L)). Liver Function Tests: Recent Labs  Lab 09/17/20 1345 09/18/20 0533  AST  21 15  ALT 12 9  ALKPHOS 276* 168*  BILITOT 1.0 0.8  PROT 6.7 5.0*  ALBUMIN 2.4* 1.8*    No results for input(s): LIPASE, AMYLASE in the last 168 hours. No results for input(s): AMMONIA in the last 168 hours. Coagulation Profile: Recent Labs  Lab 09/17/20 1345  INR 1.1    Cardiac Enzymes: No results for input(s): CKTOTAL, CKMB, CKMBINDEX, TROPONINI in the last 168 hours. BNP (last 3 results) No results for input(s): PROBNP in the last 8760 hours. HbA1C: No results for input(s): HGBA1C in the last 72 hours. CBG: No results for input(s): GLUCAP in the last 168 hours. Lipid Profile: No results for input(s): CHOL, HDL, LDLCALC, TRIG, CHOLHDL, LDLDIRECT in the last 72 hours. Thyroid Function Tests: No results for input(s): TSH, T4TOTAL, FREET4,  T3FREE, THYROIDAB in the last 72 hours. Anemia Panel: No results for input(s): VITAMINB12, FOLATE, FERRITIN, TIBC, IRON, RETICCTPCT in the last 72 hours. Urine analysis:    Component Value Date/Time   COLORURINE AMBER (A) 08/22/2020 1253   APPEARANCEUR HAZY (A) 08/22/2020 1253   LABSPEC 1.024 08/22/2020 1253   PHURINE 5.0 08/22/2020 [place] 08/22/2020 1253   HGBUR NEGATIVE 08/22/2020 [place] 08/22/2020 1253   KETONESUR NEGATIVE 08/22/2020 1253   PROTEINUR NEGATIVE 08/22/2020 1253   UROBILINOGEN 0.2 10/21/2012 1737   NITRITE NEGATIVE 08/22/2020 1253   LEUKOCYTESUR NEGATIVE 08/22/2020 1253   Sepsis Labs: _0 (procalcitonin:4,lacticacidven:4)  ) Recent Results (from the past 240 hour(s))  Blood culture (routine x 2)     Status: None   Collection Time: 09/17/20  1:58 PM   Specimen: BLOOD  Result Value Ref Range Status   Specimen Description BLOOD BLOOD LEFT FOREARM  Final   Special Requests   Final    BOTTLES DRAWN AEROBIC AND ANAEROBIC Blood Culture adequate volume   Culture   Final    NO GROWTH 5 DAYS Performed at [facility], [address] [place], [place] [address]    Report Status 09/22/2020 FINAL  Final  Blood culture (routine x 2)     Status: None   Collection Time: 09/17/20  1:59 PM   Specimen: BLOOD  Result Value Ref Range Status   Specimen Description BLOOD BLOOD RIGHT FOREARM  Final   Special Requests   Final    BOTTLES DRAWN AEROBIC AND ANAEROBIC Blood Culture results may not be optimal due to an excessive volume of blood received in culture bottles   Culture   Final    NO GROWTH 5 DAYS Performed at [facility], [address] [place], [place] [address]    Report Status 09/22/2020 FINAL  Final  Resp Panel by RT-PCR (Flu A&B, Covid) Nasopharyngeal Swab     Status: None   Collection Time: 09/17/20  2:14 PM   Specimen: Nasopharyngeal Swab; Nasopharyngeal(NP) swabs in vial transport medium   Result Value Ref Range Status   SARS Coronavirus 2 by RT PCR NEGATIVE NEGATIVE Final    Comment: (NOTE) SARS-CoV-2 target nucleic acids are NOT DETECTED.  The SARS-CoV-2 RNA is generally detectable in upper respiratory specimens during the acute phase of infection. The lowest concentration of SARS-CoV-2 viral copies this assay can detect is 138 copies/mL. A negative result does not preclude SARS-Cov-2 infection and should not be used as the sole basis for treatment or other patient management decisions. A negative result may occur with  improper specimen collection/handling, submission of specimen other than nasopharyngeal swab, presence of viral mutation(s)  within the areas targeted by this assay, and inadequate number of viral copies(<138 copies/mL). A negative result must be combined with clinical observations, patient history, and epidemiological information. The expected result is Negative.  Fact Sheet for Patients:  EntrepreneurPulse.com.au  Fact Sheet for Healthcare Providers:  IncredibleEmployment.be  This test is no t yet approved or cleared by the [place] FDA and  has been authorized for detection and/or diagnosis of SARS-CoV-2 by FDA under an Emergency Use Authorization (EUA). This EUA will remain  in effect (meaning this test can be used) for the duration of the COVID-19 declaration under Section 564(b)(1) of the Act, 21 U.S.C.section 360bbb-3(b)(1), unless the authorization is terminated  or revoked sooner.       Influenza A by PCR NEGATIVE NEGATIVE Final   Influenza B by PCR NEGATIVE NEGATIVE Final    Comment: (NOTE) The Xpert Xpress SARS-CoV-2/FLU/RSV plus assay is intended as an aid in the diagnosis of influenza from Nasopharyngeal swab specimens and should not be used as a sole basis for treatment. Nasal washings and aspirates are unacceptable for Xpert Xpress SARS-CoV-2/FLU/RSV testing.  Fact Sheet for  Patients: EntrepreneurPulse.com.au  Fact Sheet for Healthcare Providers: IncredibleEmployment.be  This test is not yet approved or cleared by the [place] FDA and has been authorized for detection and/or diagnosis of SARS-CoV-2 by FDA under an Emergency Use Authorization (EUA). This EUA will remain in effect (meaning this test can be used) for the duration of the COVID-19 declaration under Section 564(b)(1) of the Act, 21 U.S.C. section 360bbb-3(b)(1), unless the authorization is terminated or revoked.  Performed at [facility], [address] [place], [place] [address]   MRSA PCR Screening     Status: Abnormal   Collection Time: 09/18/20  8:36 AM   Specimen: Nasopharyngeal  Result Value Ref Range Status   MRSA by PCR POSITIVE (A) NEGATIVE Final    Comment:        The GeneXpert MRSA Assay (FDA approved for NASAL specimens only), is one component of a comprehensive MRSA colonization surveillance program. It is not intended to diagnose MRSA infection nor to guide or monitor treatment for MRSA infections. RESULT CALLED TO, READ BACK BY AND VERIFIED WITH: Leota Jacobsen 1029 09/18/20 GM Performed at [facility], [address] [place], [place] [address]   Aspergillus Ag, BAL/Serum     Status: None   Collection Time: 09/19/20  5:05 AM   Specimen: Vein; Blood  Result Value Ref Range Status   Aspergillus Ag, BAL/Serum 0.02 0.00 - 0.49 Index Final    Comment: (NOTE) Performed At: [facility] [place], [place] [id number] Rush Farmer MD VQ:[id number]   Expectorated Sputum Assessment w Gram Stain, Rflx to Resp Cult     Status: None   Collection Time: 09/20/20  5:33 AM   Specimen: Expectorated Sputum  Result Value Ref Range Status   Specimen Description EXPECTORATED SPUTUM  Final   Special Requests NONE  Final   Sputum evaluation   Final    THIS SPECIMEN IS ACCEPTABLE FOR SPUTUM  CULTURE Performed at [facility] [place], [address] [place], [place] [address]    Report Status 09/20/2020 FINAL  Final  Culture, Respiratory w Gram Stain     Status: None   Collection Time: 09/20/20  5:33 AM  Result Value Ref Range Status   Specimen Description   Final    EXPECTORATED SPUTUM Performed at [facility], [address] [place], [place] [address]  Special Requests   Final    NONE Reflexed from [id number] Performed at [facility], [place], [place] [address]    Gram Stain   Final    ABUNDANT WBC PRESENT,BOTH PMN AND MONONUCLEAR FEW GRAM POSITIVE COCCI IN PAIRS IN CLUSTERS Performed at [facility], [place] [address], [place], [place] [address]    Culture   Final    ABUNDANT METHICILLIN RESISTANT STAPHYLOCOCCUS AUREUS   Report Status 09/23/2020 FINAL  Final   Organism ID, Bacteria METHICILLIN RESISTANT STAPHYLOCOCCUS AUREUS  Final      Susceptibility   Methicillin resistant staphylococcus aureus - MIC*    CIPROFLOXACIN <=0.5 SENSITIVE Sensitive     ERYTHROMYCIN >=8 RESISTANT Resistant     GENTAMICIN <=0.5 SENSITIVE Sensitive     OXACILLIN >=4 RESISTANT Resistant     TETRACYCLINE <=1 SENSITIVE Sensitive     VANCOMYCIN <=0.5 SENSITIVE Sensitive     TRIMETH/SULFA <=10 SENSITIVE Sensitive     CLINDAMYCIN <=0.25 SENSITIVE Sensitive     RIFAMPIN <=0.5 SENSITIVE Sensitive     Inducible Clindamycin NEGATIVE Sensitive     * ABUNDANT METHICILLIN RESISTANT STAPHYLOCOCCUS AUREUS  Acid Fast Smear (AFB)     Status: None   Collection Time: 09/20/20  5:33 AM   Specimen: Sputum  Result Value Ref Range Status   AFB Specimen Processing Concentration  Final   Acid Fast Smear Negative  Final    Comment: (NOTE) Performed At: [facility] [address] [place], [place] [id number] Rush Farmer MD MI:[id number]    Source (AFB) EXPECTORATED SPUTUM  Final    Comment: Performed at [facility], [address]  [place], [place] [address]  Anaerobic culture w Gram Stain     Status: None (Preliminary result)   Collection Time: 09/20/20  5:33 AM   Specimen: Expectorated Sputum  Result Value Ref Range Status   Specimen Description   Final    EXPECTORATED SPUTUM Performed at [facility], [address] [place], [place] [address]    Special Requests   Final    Normal Performed at [facility], [address] [place], [place] [address]    Gram Stain   Final    RARE WBC PRESENT,BOTH PMN AND MONONUCLEAR RARE GRAM POSITIVE COCCI IN PAIRS IN CLUSTERS    Culture   Final    HOLDING FOR POSSIBLE ANAEROBE Performed at [facility], [place] [address], [place], [place] [address]    Report Status PENDING  Incomplete  Culture, fungus without smear     Status: Abnormal (Preliminary result)   Collection Time: 09/20/20  5:33 AM   Specimen: Expectorated Sputum  Result Value Ref Range Status   Specimen Description   Final    EXPECTORATED SPUTUM Performed at [facility], [address], [place], [place] [address]    Special Requests   Final    Normal Performed at [facility], [address] [place], [place] [address]    Culture (A)  Final    CANDIDA DUBLINIENSIS CONTINUING TO HOLD Performed at [facility], [place] [address] [place], [place] [address]    Report Status PENDING  Incomplete  Expectorated Sputum Assessment w Gram Stain, Rflx to Resp Cult     Status: None   Collection Time: 09/20/20  9:13 AM   Specimen: Sputum  Result Value Ref Range Status   Specimen Description SPUTUM  Final   Special Requests Normal  Final   Sputum evaluation  Final    THIS SPECIMEN IS ACCEPTABLE FOR SPUTUM CULTURE Performed at [facility], [address] [place], [place] [address]    Report Status 09/20/2020 FINAL  Final  Culture, Respiratory w Gram Stain     Status: None   Collection Time: 09/20/20  9:13 AM   Specimen: SPU  Result Value Ref Range  Status   Specimen Description   Final    SPUTUM Performed at [facility], [address] [place], [place] [address]    Special Requests   Final    Normal Reflexed from [id number] Performed at [address] [place], [place] [address]    Gram Stain   Final    RARE WBC PRESENT,BOTH PMN AND MONONUCLEAR RARE GRAM POSITIVE COCCI IN PAIRS IN CLUSTERS    Culture   Final    FEW STAPHYLOCOCCUS AUREUS SUSCEPTIBILITIES PERFORMED ON PREVIOUS CULTURE WITHIN THE LAST 5 DAYS. Performed at [facility], [place] [address] [place], [place] [address]    Report Status 09/23/2020 FINAL  Final         Radiology Studies: No results found.    Scheduled Meds:  Chlorhexidine Gluconate Cloth  6 each Topical Q0600   feeding supplement  237 mL Oral TID BM   guaiFENesin  600 mg Oral BID   linezolid  600 mg Oral Q12H   multivitamin with minerals  1 tablet Oral Daily   mupirocin ointment   Nasal BID   nicotine  21 mg Transdermal Daily   predniSONE  10 mg Oral BID WC   Continuous Infusions:  fluconazole (DIFLUCAN) IV Stopped (09/23/20 1955)      LOS: 7 days    Time spent: 30 minutes  This record has been created using Systems analyst. Errors have been sought and corrected,but may not always be located. Such creation errors do not reflect on the standard of care.   Lorella Nimrod, MD [facility] 09/24/2020, 7:43 AM     Please page though [place] or Epic secure chat:  For [employer], Adult nurse

## 2020-09-24 NOTE — Progress Notes (Signed)
   Date of Admission:  09/17/2020     ID: Marcus [name] is a 60 y.o. male  Principal Problem:   Acute on chronic respiratory failure with hypoxia (HCC) Active Problems:   Seropositive rheumatoid arthritis (HCC)   Chronic obstructive pulmonary disease (HCC)   Sepsis due to undetermined organism (HCC)   Hypokalemia   Chronic diastolic CHF (congestive heart failure) (HCC)   Cavitary pneumonia   Acquired neutropenia (HCC)   Hemoptysis   Lung abscess (HCC)   Protein-calorie malnutrition, severe    Subjective: Patient doing better Says he is coughing less No fever   Medications:   Chlorhexidine Gluconate Cloth  6 each Topical Q0600   feeding supplement  237 mL Oral TID BM   guaiFENesin  600 mg Oral BID   linezolid  600 mg Oral Q12H   metroNIDAZOLE  500 mg Oral BID   multivitamin with minerals  1 tablet Oral Daily   mupirocin ointment   Nasal BID   nicotine  21 mg Transdermal Daily   predniSONE  10 mg Oral BID WC    Objective: Vital signs in last 24 hours: Temp:  [97.4 F (36.3 C)-98.5 F (36.9 C)] 97.8 F (36.6 C) (06/20 2026) Pulse Rate:  [63-96] 96 (06/20 2026) Resp:  [16-20] 20 (06/20 2026) BP: (96-125)/(63-79) 125/76 (06/20 2026) SpO2:  [97 %-98 %] 98 % (06/20 2026)  PHYSICAL EXAM:  General: Alert, cooperative, no distress, appears stated age.  Head: Normocephalic, without obvious abnormality, atraumatic. Eyes: Conjunctivae clear, anicteric sclerae. Pupils are equal ENT Nares normal. No drainage or sinus tenderness. Lips, mucosa, and tongue normal. No Thrush Neck: Supple, symmetrical, no adenopathy, thyroid: non tender no carotid bruit and no JVD. Back: No CVA tenderness. Lungs: Crepitus right side heart: Regular rate and rhythm, no murmur, rub or gallop. Abdomen: Soft, non-tender,not distended. Bowel sounds normal. No masses Extremities: atraumatic, no cyanosis. No edema. No clubbing Skin: No rashes or lesions. Or bruising Lymph: Cervical, supraclavicular  normal. Neurologic: Grossly non-focal  Lab Results Recent Labs    09/23/20 0528 09/24/20 0702  WBC 17.2* 4.1  HGB 11.4* 11.5*  HCT 36.5* 36.9*  NA 134* 135  K 4.5 5.0  CL 93* 94*  CO2 38* 35*  BUN 16 23*  CREATININE 0.46* 0.56*      Assessment/Plan: Severe lung abscess following necrotizing pneumonia due to MRSA.  Also has Prevotella and Candida glabrata in cells in the sputum culture.  Candida has not a true pathogen.  It is just colonizing the oral cavity hence will not treat.  Currently on linezolid.  Okay to add Flagyl for Prevotella.  Patient is going to get a repeat x-ray to see whether there is any difference in the size of the abscess.  If no change he is going to need Thoracic surgery.  Rheumatoid arthritis with Felty syndrome  Neutropenia for which he was given G-CSF and  leukocytosis increased.  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 Discussed with Dr. Raul Del

## 2020-09-25 ENCOUNTER — Inpatient Hospital Stay: Payer: Medicaid Other

## 2020-09-25 DIAGNOSIS — J441 Chronic obstructive pulmonary disease with (acute) exacerbation: Secondary | ICD-10-CM | POA: Diagnosis not present

## 2020-09-25 DIAGNOSIS — J852 Abscess of lung without pneumonia: Secondary | ICD-10-CM | POA: Diagnosis not present

## 2020-09-25 DIAGNOSIS — J9621 Acute and chronic respiratory failure with hypoxia: Secondary | ICD-10-CM | POA: Diagnosis not present

## 2020-09-25 DIAGNOSIS — Z7189 Other specified counseling: Secondary | ICD-10-CM | POA: Diagnosis not present

## 2020-09-25 LAB — PATHOLOGIST SMEAR REVIEW

## 2020-09-25 LAB — CBC WITH DIFFERENTIAL/PLATELET
Abs Immature Granulocytes: 0.7 10*3/uL — ABNORMAL HIGH (ref 0.00–0.07)
Basophils Absolute: 0.1 10*3/uL (ref 0.0–0.1)
Basophils Relative: 2 %
Eosinophils Absolute: 0 10*3/uL (ref 0.0–0.5)
Eosinophils Relative: 0 %
HCT: 35.4 % — ABNORMAL LOW (ref 39.0–52.0)
Hemoglobin: 10.8 g/dL — ABNORMAL LOW (ref 13.0–17.0)
Immature Granulocytes: 26 %
Lymphocytes Relative: 22 %
Lymphs Abs: 0.6 10*3/uL — ABNORMAL LOW (ref 0.7–4.0)
MCH: 27.2 pg (ref 26.0–34.0)
MCHC: 30.5 g/dL (ref 30.0–36.0)
MCV: 89.2 fL (ref 80.0–100.0)
Monocytes Absolute: 0.8 10*3/uL (ref 0.1–1.0)
Monocytes Relative: 29 %
Neutro Abs: 0.6 10*3/uL — ABNORMAL LOW (ref 1.7–7.7)
Neutrophils Relative %: 21 %
Platelets: 221 10*3/uL (ref 150–400)
RBC: 3.97 MIL/uL — ABNORMAL LOW (ref 4.22–5.81)
RDW: 20.1 % — ABNORMAL HIGH (ref 11.5–15.5)
WBC: 2.7 10*3/uL — ABNORMAL LOW (ref 4.0–10.5)
nRBC: 4.1 % — ABNORMAL HIGH (ref 0.0–0.2)

## 2020-09-25 LAB — CRYPTOCOCCAL ANTIGEN: Crypto Ag: NEGATIVE

## 2020-09-25 LAB — MAGNESIUM: Magnesium: 2.2 mg/dL (ref 1.7–2.4)

## 2020-09-25 IMAGING — CR DG CHEST 2V
1 series · 1 of 1 positions shown · non-contrast
Comparison: CT [DATE].  Chest x-ray [DATE].

CLINICAL DATA: Cough.  Follow-up treatment.

EXAM:
CHEST - 2 VIEW

[chest ap]
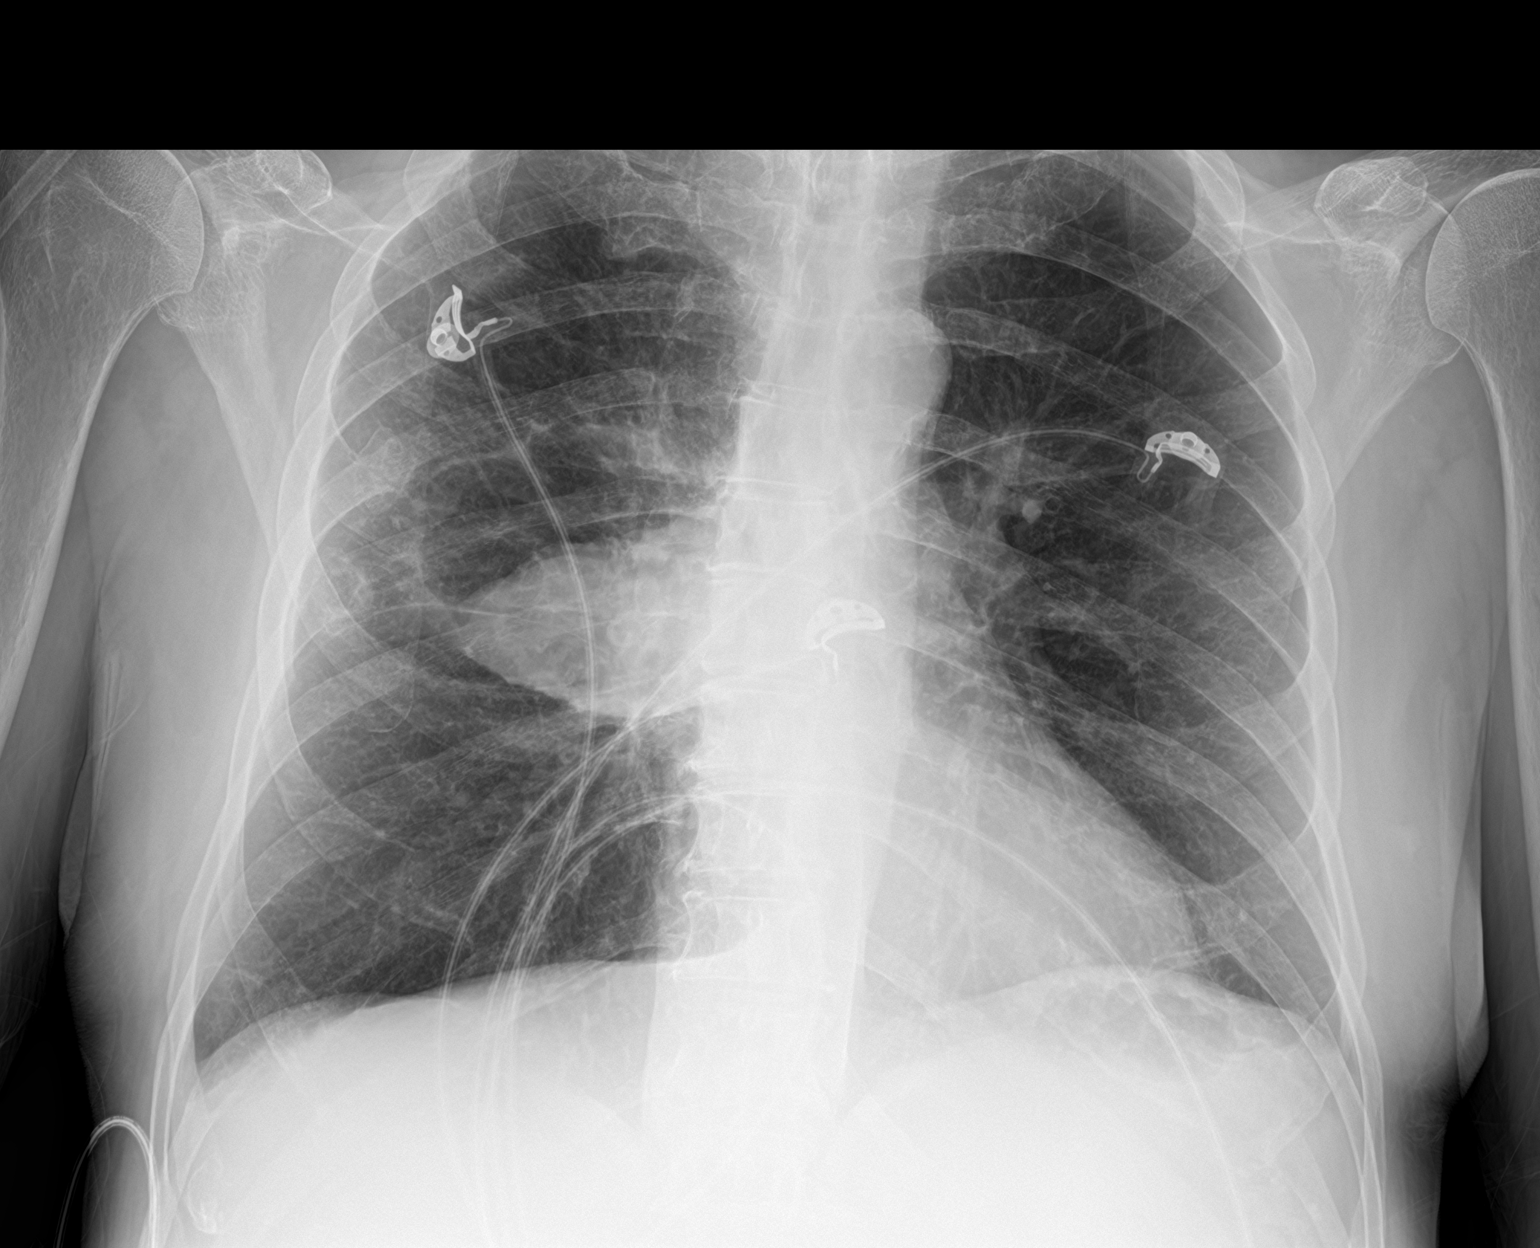

[1 of 1 positions shown; findings below may reference images not displayed]

FINDINGS: Mediastinum and hilar structures appears stable. Prominent cavitary
infiltrate/mass is again noted in the anterior medial aspect of the
right lung. Slight improvement from prior exams may be present. Mild
adjacent infiltrate also again noted. Continued follow-up exam
suggested to demonstrate complete resolution. No scratched it tiny
right pleural effusion cannot be excluded. No pneumothorax. Old
right rib fractures again noted. Degenerative change thoracic spine.
IMPRESSION: Prominent cavitary infiltrate/mass is again noted in the
anteromedial aspect of the right lung. Slight improvement from prior
exam may be present. Mild adjacent infiltrate also again noted.
Continued follow-up exam suggested to demonstrate complete
resolution.

## 2020-09-25 MED ORDER — ALUM & MAG HYDROXIDE-SIMETH 200-200-20 MG/5ML PO SUSP
30.0000 mL | ORAL | Status: DC | PRN
  Administered 2020-09-25: 30 mL via ORAL
  Filled 2020-09-25: qty 30

## 2020-09-25 MED ORDER — SODIUM CHLORIDE 0.9 % IV BOLUS
250.0000 mL | Freq: Once | INTRAVENOUS | Status: AC
  Administered 2020-09-25: 250 mL via INTRAVENOUS

## 2020-09-25 MED ORDER — TBO-FILGRASTIM 480 MCG/0.8ML ~~LOC~~ SOSY
480.0000 ug | PREFILLED_SYRINGE | Freq: Once | SUBCUTANEOUS | Status: AC
  Administered 2020-09-25: 480 ug via SUBCUTANEOUS
  Filled 2020-09-25: qty 0.8

## 2020-09-25 NOTE — Progress Notes (Signed)
[place] [facility] PROGRESS NOTE    Marcus [name]  FTD:[id number] DOB: 07-31-1960 DOA: 09/17/2020 PCP: [name], FNP    Brief Narrative:  Marcus [name] is a 60 y.o. M with RA c/b Felty syndrome/neutropenia, smoking, and COPD as well as recently diagnosed MRSA abscess who presents with failure of outpatient treamtent.  Patient admitted last month for 8 days for cough and shortness of breath as well as hemoptysis found to have a large cavitary lesion in the right upper lobe concerning for lung abscess.  Sputum was positive for MRSA and so he was discharged on 6 weeks of oral doxycycline.  He reports adherence to his medication after some initial improvement with IV vancomycin in the hospital, he then started to get persistently worse, finally was coughing up blood and had chest pain and malaise/fatigue, so he came to the ER.  He was started on linezolid by ID, continue to have some hemoptysis, pulmonology is recommending bronchoscopy and a possible surgery.  EEG was negative for any seizure-like activity.  Imaging with concern of spinal stenosis but no emergent need for any intervention.  Neurology is recommending outpatient neurosurgery evaluation once improved from current hospitalization.  If patient developed any incontinence or worsening focal weakness then neurosurgery should be consulted urgently.  Patient was started on Granix due to worsening neutropenia , received 2 doses which resulted in markedly elevated leukocytosis and neutrophils-discontinue today after talking with hematology.   Resp cx with Candida dublisiensis-pulmonology is started him on Diflucan initially and then later ID stopped that thinking a test just a colonization. Fungitell was negative. Patient slowly improving clinically but repeat chest x-ray today was without any significant improvement but remained stable. Per pulmonology most likely will need a thoracic surgery-they will talk with thoracic surgeon  at [facility].  Assessment & Plan:  Acute on chronic respiratory failure with hypoxia.  Improved, currently seems to be around his baseline. At baseline he is on 2 L.  Currently on 3L of oxygen. -Continue with incentive spirometry and flutter valve -Continue with supplemental oxygen  Necrotizing pneumonia causing sepsis.  MRSA positive and Candida positive. Presented with tachycardia, tachypnea, leukopenia, and blood pressure less than 100. Initially received vancomycin, cefepime and Flagyl later transitioned to linezolid by ID Pulmonology is recommending bronchoscopy, multiple labs including Fungitell is pending, concern of aspergilloma, might need bronchoscopy or a surgery.  Resp cx with Candida dublisiensis.  Fungitell was negative -Diflucan was discontinued by ID thinking that it might be just colonization. -Continue with linezolid -Repeat chest x-ray today was without any significant changes, stable findings. -Per pulmonology patient will need assessment by thoracic surgery for definitive treatment of his infection, and chances of underlying malignancy also remains high.  Neutropenia.  Noted during last hospitalization.  He was started on Granix for 3 doses by hematology, received 2 doses which resulted in significant increase in leukocytosis and neutrophils, Granix was discontinued after talking with hematology. Leukocytosis and neutrophil with significant decrease i,, today WBC of 2.7 and neutrophil of 0.6. 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  Resolved -Replete potassium as needed and monitor  Shakiness.  There was some witnessed shakiness, neurology was consulted for concern of seizure-like activity.  No shakiness or any other seizure noted.  EEG normal. Neurology signed off and they are recommending outpatient neurosurgery evaluation for some spinal  stenosis, no emergent need.  If patient  developed any new focal weakness or incontinence then they can be reconsulted and at that point he will need emergent neurosurgery evaluation. -Continue to monitor  Chronic diastolic CHF Euvolemic  Intussusception This is an apparently incidental finding, no evidence of small bowel obstruction, belly exam benign - Advance diet as tolerated -Stop PPI   Anemia of chronic disease Hgb stable with some improvement at 10.9 today in setting of mild hemoptysis -Trend Hgb -Monitor for ongoing bleeding  Hyponatremia.  Resolved.   Smoking Cessation recommended, modalities discussed  Severe protein calorie malnutrition As evidenced by severe loss of subcutaneous muscle mass and fat, chronic infection, smoking -Consutl dietitian  Disposition: Status is: Inpatient  Remains inpatient appropriate because: protect  failed outpatient therapy, still on higher than baseline O2, requires ongoing diagnostic testing perhaps as dictated by Pulmonogloy  Dispo: The patient is from: Home              Anticipated d/c is to: SNF              Patient currently is not medically stable to d/c.   Difficult to place patient No              Level of care: Med-Surg  MDM: The below labs and imaging reports were reviewed and summarized above.  Medication management as above.  This is a severe threat to life and bodily function    DVT prophylaxis:   SCDs.  Code Status: Full code Family Communication: Discussed with patient.  Subjective: Patient was seen and examined today.  Denies any complaints.  He was asking about going home.  Continue to have some cough and hemoptysis, stating that it is much improved than before.  Objective: Vitals:   09/25/20 0329 09/25/20 0434 09/25/20 0735 09/25/20 1150  BP: 132/87  109/73 108/68  Pulse: 86  77 90  Resp: _0 Temp: 97.9 F (36.6 C)  97.6 F (36.4 C) (!) 97.5 F (36.4 C)  TempSrc:      SpO2: 98%  98% 97%   Weight:  57.5 kg    Height:        Intake/Output Summary (Last 24 hours) at 09/25/2020 1306 Last data filed at 09/25/2020 1149 Gross per 24 hour  Intake 723 ml  Output 2000 ml  Net -1277 ml    Filed Weights   09/22/20 0500 09/23/20 0512 09/25/20 0434  Weight: 59.4 kg 59.1 kg 57.5 kg    Examination:  General.  Frail and malnourished gentleman, in no acute distress. Pulmonary.  Decreased breath sound at bases, normal respiratory effort. CV.  Regular rate and rhythm, no JVD, rub or murmur. Abdomen.  Soft, nontender, nondistended, BS positive. CNS.  Alert and oriented .  No focal neurologic deficit. Extremities.  No edema, no cyanosis, pulses intact and symmetrical. Psychiatry.  Judgment and insight appears normal.   Data Reviewed: I have personally reviewed following labs and imaging studies:  CBC: Recent Labs  Lab 09/21/20 0405 09/22/20 0518 09/23/20 0528 09/24/20 0702 09/25/20 0416  WBC 8.3 42.9* 17.2* 4.1 2.7*  NEUTROABS 5.9 37.0* 14.0* 1.6* 0.6*  HGB 10.1* 10.9* 11.4* 11.5* 10.8*  HCT 31.9* 33.8* 36.5* 36.9* 35.4*  MCV 87.2 86.9 89.7 87.6 89.2  PLT 238 197 200 227 147    Basic Metabolic Panel: Recent Labs  Lab 09/19/20 0506 09/20/20 0443 09/23/20 0528 09/24/20 0702 09/25/20 0416  NA 136 138 134* 135  --   K 3.4* 3.9 4.5 5.0  --  CL 103 101 93* 94*  --   CO2 24 31 38* 35*  --   GLUCOSE 163* 132* 117* 123*  --   BUN _0 23*  --   CREATININE 0.48* 0.34* 0.46* 0.56*  --   CALCIUM 7.8* 8.2* 8.3* 8.3*  --   MG 1.8 2.0  --   --  2.2    GFR: Estimated Creatinine Clearance: 80.9 mL/min (A) (by C-G formula based on SCr of 0.56 mg/dL (L)). Liver Function Tests: No results for input(s): AST, ALT, ALKPHOS, BILITOT, PROT, ALBUMIN in the last 168 hours.  No results for input(s): LIPASE, AMYLASE in the last 168 hours. No results for input(s): AMMONIA in the last 168 hours. Coagulation Profile: No results for input(s): INR, PROTIME in the last 168  hours.  Cardiac Enzymes: No results for input(s): CKTOTAL, CKMB, CKMBINDEX, TROPONINI in the last 168 hours. BNP (last 3 results) No results for input(s): PROBNP in the last 8760 hours. HbA1C: No results for input(s): HGBA1C in the last 72 hours. CBG: No results for input(s): GLUCAP in the last 168 hours. Lipid Profile: No results for input(s): CHOL, HDL, LDLCALC, TRIG, CHOLHDL, LDLDIRECT in the last 72 hours. Thyroid Function Tests: No results for input(s): TSH, T4TOTAL, FREET4, T3FREE, THYROIDAB in the last 72 hours. Anemia Panel: No results for input(s): VITAMINB12, FOLATE, FERRITIN, TIBC, IRON, RETICCTPCT in the last 72 hours. Urine analysis:    Component Value Date/Time   COLORURINE AMBER (A) 08/22/2020 1253   APPEARANCEUR HAZY (A) 08/22/2020 1253   LABSPEC 1.024 08/22/2020 1253   PHURINE 5.0 08/22/2020 [place] 08/22/2020 1253   HGBUR NEGATIVE 08/22/2020 [place] 08/22/2020 [place] 08/22/2020 1253   PROTEINUR NEGATIVE 08/22/2020 1253   UROBILINOGEN 0.2 10/21/2012 1737   NITRITE NEGATIVE 08/22/2020 1253   LEUKOCYTESUR NEGATIVE 08/22/2020 1253   Sepsis Labs: _1 (procalcitonin:4,lacticacidven:4)  ) Recent Results (from the past 240 hour(s))  Blood culture (routine x 2)     Status: None   Collection Time: 09/17/20  1:58 PM   Specimen: BLOOD  Result Value Ref Range Status   Specimen Description BLOOD BLOOD LEFT FOREARM  Final   Special Requests   Final    BOTTLES DRAWN AEROBIC AND ANAEROBIC Blood Culture adequate volume   Culture   Final    NO GROWTH 5 DAYS Performed at [facility], [address] [place], [place] [address]    Report Status 09/22/2020 FINAL  Final  Blood culture (routine x 2)     Status: None   Collection Time: 09/17/20  1:59 PM   Specimen: BLOOD  Result Value Ref Range Status   Specimen Description BLOOD BLOOD RIGHT FOREARM  Final   Special Requests   Final    BOTTLES  DRAWN AEROBIC AND ANAEROBIC Blood Culture results may not be optimal due to an excessive volume of blood received in culture bottles   Culture   Final    NO GROWTH 5 DAYS Performed at [facility], [address] [place], [place] [address]    Report Status 09/22/2020 FINAL  Final  Resp Panel by RT-PCR (Flu A&B, Covid) Nasopharyngeal Swab     Status: None   Collection Time: 09/17/20  2:14 PM   Specimen: Nasopharyngeal Swab; Nasopharyngeal(NP) swabs in vial transport medium  Result Value Ref Range Status   SARS Coronavirus 2 by RT PCR NEGATIVE NEGATIVE Final    Comment: (NOTE) SARS-CoV-2 target nucleic acids are  NOT DETECTED.  The SARS-CoV-2 RNA is generally detectable in upper respiratory specimens during the acute phase of infection. The lowest concentration of SARS-CoV-2 viral copies this assay can detect is 138 copies/mL. A negative result does not preclude SARS-Cov-2 infection and should not be used as the sole basis for treatment or other patient management decisions. A negative result may occur with  improper specimen collection/handling, submission of specimen other than nasopharyngeal swab, presence of viral mutation(s) within the areas targeted by this assay, and inadequate number of viral copies(<138 copies/mL). A negative result must be combined with clinical observations, patient history, and epidemiological information. The expected result is Negative.  Fact Sheet for Patients:  EntrepreneurPulse.com.au  Fact Sheet for Healthcare Providers:  IncredibleEmployment.be  This test is no t yet approved or cleared by the [place] FDA and  has been authorized for detection and/or diagnosis of SARS-CoV-2 by FDA under an Emergency Use Authorization (EUA). This EUA will remain  in effect (meaning this test can be used) for the duration of the COVID-19 declaration under Section 564(b)(1) of the Act, 21 U.S.C.section  360bbb-3(b)(1), unless the authorization is terminated  or revoked sooner.       Influenza A by PCR NEGATIVE NEGATIVE Final   Influenza B by PCR NEGATIVE NEGATIVE Final    Comment: (NOTE) The Xpert Xpress SARS-CoV-2/FLU/RSV plus assay is intended as an aid in the diagnosis of influenza from Nasopharyngeal swab specimens and should not be used as a sole basis for treatment. Nasal washings and aspirates are unacceptable for Xpert Xpress SARS-CoV-2/FLU/RSV testing.  Fact Sheet for Patients: EntrepreneurPulse.com.au  Fact Sheet for Healthcare Providers: IncredibleEmployment.be  This test is not yet approved or cleared by the [place] FDA and has been authorized for detection and/or diagnosis of SARS-CoV-2 by FDA under an Emergency Use Authorization (EUA). This EUA will remain in effect (meaning this test can be used) for the duration of the COVID-19 declaration under Section 564(b)(1) of the Act, 21 U.S.C. section 360bbb-3(b)(1), unless the authorization is terminated or revoked.  Performed at [facility], [address] [place], [place] [address]   MRSA PCR Screening     Status: Abnormal   Collection Time: 09/18/20  8:36 AM   Specimen: Nasopharyngeal  Result Value Ref Range Status   MRSA by PCR POSITIVE (A) NEGATIVE Final    Comment:        The GeneXpert MRSA Assay (FDA approved for NASAL specimens only), is one component of a comprehensive MRSA colonization surveillance program. It is not intended to diagnose MRSA infection nor to guide or monitor treatment for MRSA infections. RESULT CALLED TO, READ BACK BY AND VERIFIED WITH: Leota Jacobsen 1029 09/18/20 GM Performed at [facility], [address] [place], [place] [address]   Aspergillus Ag, BAL/Serum     Status: None   Collection Time: 09/19/20  5:05 AM   Specimen: Vein; Blood  Result Value Ref Range Status   Aspergillus Ag, BAL/Serum 0.02 0.00 -  0.49 Index Final    Comment: (NOTE) Performed At: [facility] [place], [place] [id number] Rush Farmer MD BT:[id number]   Expectorated Sputum Assessment w Gram Stain, Rflx to Resp Cult     Status: None   Collection Time: 09/20/20  5:33 AM   Specimen: Expectorated Sputum  Result Value Ref Range Status   Specimen Description EXPECTORATED SPUTUM  Final   Special Requests NONE  Final   Sputum evaluation   Final    THIS SPECIMEN  IS ACCEPTABLE FOR SPUTUM CULTURE Performed at [facility], [address] [place], [place] [address]    Report Status 09/20/2020 FINAL  Final  Culture, Respiratory w Gram Stain     Status: None   Collection Time: 09/20/20  5:33 AM  Result Value Ref Range Status   Specimen Description   Final    EXPECTORATED SPUTUM Performed at [facility], [address] [place], [place] [address]    Special Requests   Final    NONE Reflexed from [phone] Performed at [facility], [address] [place], [place] [address]    Gram Stain   Final    ABUNDANT WBC PRESENT,BOTH PMN AND MONONUCLEAR FEW GRAM POSITIVE COCCI IN PAIRS IN CLUSTERS Performed at [facility], [address] [place], [place] [address]    Culture   Final    ABUNDANT METHICILLIN RESISTANT STAPHYLOCOCCUS AUREUS   Report Status 09/23/2020 FINAL  Final   Organism ID, Bacteria METHICILLIN RESISTANT STAPHYLOCOCCUS AUREUS  Final      Susceptibility   Methicillin resistant staphylococcus aureus - MIC*    CIPROFLOXACIN <=0.5 SENSITIVE Sensitive     ERYTHROMYCIN >=8 RESISTANT Resistant     GENTAMICIN <=0.5 SENSITIVE Sensitive     OXACILLIN >=4 RESISTANT Resistant     TETRACYCLINE <=1 SENSITIVE Sensitive     VANCOMYCIN <=0.5 SENSITIVE Sensitive     TRIMETH/SULFA <=10 SENSITIVE Sensitive     CLINDAMYCIN <=0.25 SENSITIVE Sensitive     RIFAMPIN <=0.5 SENSITIVE Sensitive     Inducible Clindamycin NEGATIVE Sensitive     * ABUNDANT METHICILLIN  RESISTANT STAPHYLOCOCCUS AUREUS  Acid Fast Smear (AFB)     Status: None   Collection Time: 09/20/20  5:33 AM   Specimen: Sputum  Result Value Ref Range Status   AFB Specimen Processing Concentration  Final   Acid Fast Smear Negative  Final    Comment: (NOTE) Performed At: [facility] [place] [address] [place], [place] [id number] Rush Farmer MD TD:[id number]    Source (AFB) EXPECTORATED SPUTUM  Final    Comment: Performed at [facility], [address] [place], [place] [address]  Anaerobic culture w Gram Stain     Status: None   Collection Time: 09/20/20  5:33 AM   Specimen: Expectorated Sputum  Result Value Ref Range Status   Specimen Description   Final    EXPECTORATED SPUTUM Performed at [facility], [address] [place], [place] [address]    Special Requests   Final    Normal Performed at [facility], [address] [place], [place] [address]    Gram Stain   Final    RARE WBC PRESENT,BOTH PMN AND MONONUCLEAR RARE GRAM POSITIVE COCCI IN PAIRS IN CLUSTERS    Culture   Final    RARE PREVOTELLA NANCEIENSIS BETA LACTAMASE POSITIVE Performed at [facility], [place] [address], [place], [place] [address]    Report Status 09/24/2020 FINAL  Final  Culture, fungus without smear     Status: Abnormal (Preliminary result)   Collection Time: 09/20/20  5:33 AM   Specimen: Expectorated Sputum  Result Value Ref Range Status   Specimen Description   Final    EXPECTORATED SPUTUM Performed at [facility], [address], [place], [place] [address]    Special Requests   Final    Normal Performed at [facility], [address] [place],  [address]    Culture (A)  Final  CANDIDA DUBLINIENSIS CONTINUING TO HOLD Performed at [facility], [place] [address], [place], [place] [address]    Report Status PENDING  Incomplete  Expectorated Sputum Assessment w Gram Stain, Rflx to Resp Cult     Status: None    Collection Time: 09/20/20  9:13 AM   Specimen: Sputum  Result Value Ref Range Status   Specimen Description SPUTUM  Final   Special Requests Normal  Final   Sputum evaluation   Final    THIS SPECIMEN IS ACCEPTABLE FOR SPUTUM CULTURE Performed at [facility], [address], [place], [place] [address]    Report Status 09/20/2020 FINAL  Final  Culture, Respiratory w Gram Stain     Status: None   Collection Time: 09/20/20  9:13 AM   Specimen: SPU  Result Value Ref Range Status   Specimen Description   Final    SPUTUM Performed at [facility], [address] [place], [place] [address]    Special Requests   Final    Normal Reflexed from [phone] Performed at [facility], [address] [place], [place] [address]    Gram Stain   Final    RARE WBC PRESENT,BOTH PMN AND MONONUCLEAR RARE GRAM POSITIVE COCCI IN PAIRS IN CLUSTERS    Culture   Final    FEW STAPHYLOCOCCUS AUREUS SUSCEPTIBILITIES PERFORMED ON PREVIOUS CULTURE WITHIN THE LAST 5 DAYS. Performed at [facility], [place] [address] [place], [place] [address]    Report Status 09/23/2020 FINAL  Final         Radiology Studies: DG Chest 2 View  Result Date: 09/25/2020 CLINICAL DATA:  Cough.  Follow-up treatment. EXAM: CHEST - 2 VIEW COMPARISON:  CT 09/17/2020.  Chest x-ray 09/17/2020. FINDINGS: Mediastinum and hilar structures appears stable. Prominent cavitary infiltrate/mass is again noted in the anterior medial aspect of the right lung. Slight improvement from prior exams may be present. Mild adjacent infiltrate also again noted. Continued follow-up exam suggested to demonstrate complete resolution. No scratched it tiny right pleural effusion cannot be excluded. No pneumothorax. Old right rib fractures again noted. Degenerative change thoracic spine. IMPRESSION: Prominent cavitary infiltrate/mass is again noted in the anteromedial aspect of the right lung. Slight improvement from prior exam  may be present. Mild adjacent infiltrate also again noted. Continued follow-up exam suggested to demonstrate complete resolution. Electronically Signed   By: Marcello Moores  Register   On: 09/25/2020 11:48      Scheduled Meds:  Chlorhexidine Gluconate Cloth  6 each Topical Q0600   feeding supplement  237 mL Oral TID BM   guaiFENesin  600 mg Oral BID   linezolid  600 mg Oral Q12H   metroNIDAZOLE  500 mg Oral BID   multivitamin with minerals  1 tablet Oral Daily   mupirocin ointment   Nasal BID   nicotine  21 mg Transdermal Daily   predniSONE  10 mg Oral BID WC   Tbo-filgastrim (GRANIX) SQ  480 mcg Subcutaneous ONCE-1800   Continuous Infusions:      LOS: 8 days    Time spent: 30 minutes  This record has been created using Systems analyst. Errors have been sought and corrected,but may not always be located. Such creation errors do not reflect on the standard of care.   Lorella Nimrod, MD [facility] 09/25/2020, 1:06 PM     Please page though [place] or Epic secure chat:  For [employer], Adult nurse

## 2020-09-25 NOTE — Progress Notes (Signed)
Ok to [employer] tele per Dr Reesa Chew

## 2020-09-25 NOTE — Progress Notes (Signed)
Date of Admission:  09/17/2020      ID: Marcus [name] is a 60 y.o. male  Principal Problem:   Acute on chronic respiratory failure with hypoxia (HCC) Active Problems:   Seropositive rheumatoid arthritis (HCC)   Chronic obstructive pulmonary disease (HCC)   Sepsis due to undetermined organism (HCC)   Hypokalemia   Chronic diastolic CHF (congestive heart failure) (HCC)   Cavitary pneumonia   Acquired neutropenia (HCC)   Hemoptysis   Lung abscess (HCC)   Protein-calorie malnutrition, severe    Subjective: Doing better Cough is better Still has blood tinged sputum  Medications:   Chlorhexidine Gluconate Cloth  6 each Topical Q0600   feeding supplement  237 mL Oral TID BM   guaiFENesin  600 mg Oral BID   linezolid  600 mg Oral Q12H   metroNIDAZOLE  500 mg Oral BID   multivitamin with minerals  1 tablet Oral Daily   mupirocin ointment   Nasal BID   nicotine  21 mg Transdermal Daily   predniSONE  10 mg Oral BID WC   Tbo-filgastrim (GRANIX) SQ  480 mcg Subcutaneous ONCE-1800    Objective: Vital signs in last 24 hours: Temp:  [97.5 F (36.4 C)-97.9 F (36.6 C)] 97.5 F (36.4 C) (06/21 1150) Pulse Rate:  [77-99] 90 (06/21 1150) Resp:  [16-22] 16 (06/21 1150) BP: (108-132)/(68-87) 108/68 (06/21 1150) SpO2:  [93 %-98 %] 97 % (06/21 1150) Weight:  [57.5 kg] 57.5 kg (06/21 0434)  PHYSICAL EXAM:  General: Alert, cooperative, no distress, appears stated age.  Head: Normocephalic, without obvious abnormality, atraumatic. Eyes: Conjunctivae clear, anicteric sclerae. Pupils are equal ENT Nares normal. No drainage or sinus tenderness. Lips, mucosa, and tongue normal. No Thrush Neck: Supple, symmetrical, no adenopathy, thyroid: non tender no carotid bruit and no JVD. Back: No CVA tenderness. Lungs: crepts rt side  Heart: irregular Abdomen: Soft, non-tender,not distended. Bowel sounds normal. No masses Extremities: atraumatic, no cyanosis. No edema. No clubbing Skin: No  rashes or lesions. Or bruising Lymph: Cervical, supraclavicular normal. Neurologic: Grossly non-focal  Lab Results Recent Labs    09/23/20 0528 09/24/20 0702 09/25/20 0416  WBC 17.2* 4.1 2.7*  HGB 11.4* 11.5* 10.8*  HCT 36.5* 36.9* 35.4*  NA 134* 135  --   K 4.5 5.0  --   CL 93* 94*  --   CO2 38* 35*  --   BUN 16 23*  --   CREATININE 0.46* 0.56*  --    Liver Panel No results for input(s): PROT, ALBUMIN, AST, ALT, ALKPHOS, BILITOT, BILIDIR, IBILI in the last 72 hours. Sedimentation Rate No results for input(s): ESRSEDRATE in the last 72 hours. C-Reactive Protein No results for input(s): CRP in the last 72 hours.  Microbiology:  Studies/Results: DG Chest 2 View  Result Date: 09/25/2020 CLINICAL DATA:  Cough.  Follow-up treatment. EXAM: CHEST - 2 VIEW COMPARISON:  CT 09/17/2020.  Chest x-ray 09/17/2020. FINDINGS: Mediastinum and hilar structures appears stable. Prominent cavitary infiltrate/mass is again noted in the anterior medial aspect of the right lung. Slight improvement from prior exams may be present. Mild adjacent infiltrate also again noted. Continued follow-up exam suggested to demonstrate complete resolution. No scratched it tiny right pleural effusion cannot be excluded. No pneumothorax. Old right rib fractures again noted. Degenerative change thoracic spine. IMPRESSION: Prominent cavitary infiltrate/mass is again noted in the anteromedial aspect of the right lung. Slight improvement from prior exam may be present. Mild adjacent infiltrate also again noted. Continued follow-up exam  suggested to demonstrate complete resolution. Electronically Signed   By: Marcello Moores  Register   On: 09/25/2020 11:48     Assessment/Plan:  Right upper lobe necrotizing pneumonia followed by lung abscess with possible mass.  Not much of a change in 1 week with oral linezolid.  Patient needs thoracic surgery evaluation.  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

## 2020-09-25 NOTE — Progress Notes (Signed)
Nutrition Follow-up  DOCUMENTATION CODES:  Severe malnutrition in context of chronic illness  INTERVENTION:  Continue current diet as recommended by SLP, encouraged PO intake. Obtain updated weight weekly Ensure Enlive po TID, each supplement provides 350 kcal and 20 grams of protein. Magic cup TID with meals, each supplement provides 290 kcal and 9 grams of protein. Add MVI with minerals daily.  NUTRITION DIAGNOSIS:  Severe Malnutrition related to acute illness, chronic illness as evidenced by percent weight loss, moderate fat depletion, severe fat depletion, severe muscle depletion.  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  Per chart review, pt has lost ~39 lbs (22.7%) in about 2.5 months, which is significant and severe for the time frame.  Pt just had lunch tray delivered and on phone at the time of assessment. Intake since last assessment has been excellent with the majority of meals being 100% consumed.  Discussed in IDT rounds. Pt requesting to go home. PT recommended SNF at dc but pt refusing. MD reports pt improving and likely strong enough to go home soon.  Average Meal Intake: 6/13-6/21: 97% intake x 9 recorded meals (75-100%)  Nutritionally Relevant Medications: Scheduled Meds:  feeding supplement  237 mL Oral TID BM   linezolid  600 mg Oral Q12H   metroNIDAZOLE  500 mg Oral BID   multivitamin with minerals  1 tablet Oral Daily   predniSONE  10 mg Oral BID WC   Tbo-filgastrim (GRANIX) SQ  480 mcg Subcutaneous ONCE-1800   PRN Meds: alum & mag hydroxide-simeth  Labs Reviewed:  NUTRITION - FOCUSED  PHYSICAL EXAM: Flowsheet Row Most Recent Value  Orbital Region Severe depletion  Upper Arm Region Severe depletion  Thoracic and Lumbar Region Severe depletion  Buccal Region Moderate depletion  Temple Region Severe depletion  Clavicle Bone Region Severe depletion  Clavicle and Acromion Bone Region Severe depletion  Scapular Bone Region Unable to assess  Dorsal Hand Severe depletion  Patellar Region Severe depletion  Anterior Thigh Region Severe depletion  Posterior Calf Region Severe depletion  Edema (RD Assessment) None  Hair Reviewed  Eyes Reviewed  Mouth Reviewed  [Poor dentition]  Skin Reviewed  Nails Reviewed   Diet Order:   Diet Order             DIET DYS 3 Room service appropriate? Yes with Assist; Fluid consistency: Thin  Diet effective now                  EDUCATION NEEDS:  Education needs have been addressed  Skin:  Skin Assessment: Reviewed RN Assessment  Last BM:  6/21 - type 5  Height:  Ht Readings from Last 1 Encounters:  09/22/20 5' 5.98" (1.676 m)   Weight:  Wt Readings from Last 1 Encounters:  09/25/20 57.5 kg   Ideal Body Weight:  64.5 kg  BMI:  Body mass index is 20.47 kg/m.  Estimated Nutritional Needs:  Kcal:  2000-2200 kcal/d Protein:  100-110 grams Fluid:  >2 L  Ranell Patrick, RD, LDN Clinical Dietitian Pager on Amion

## 2020-09-25 NOTE — TOC Progression Note (Signed)
Transition of Care [facility]) - Progression Note    Patient Details  Name: Marcus [name] MRN: [id number] Date of Birth: Dec 07, 1960  Transition of Care [facility]) CM/SW [place], RN Phone Number: 09/25/2020, 11:16 AM  Clinical Narrative:   TOC in to see patient, patient is refusing SNF. Discussed Home Health with patient, he gives [facility] permission to look into [facility], but he is undecided as of this time.  TOC to follow.    Expected Discharge Plan: Home/Self Care Barriers to Discharge: Continued Medical Work up  Expected Discharge Plan and Services Expected Discharge Plan: Home/Self Care   Discharge Planning Services: CM Consult Post Acute Care Choice: NA Living arrangements for the past 2 months: Single Family Home                                       Social Determinants of Health (SDOH) Interventions    Readmission Risk Interventions Readmission Risk Prevention Plan 09/20/2020 08/25/2020  Transportation Screening Complete Complete  PCP or Specialist Appt within 3-5 Days - Complete  HRI or [place] - Complete  Social Work Consult for [place] Planning/Counseling - Complete  Palliative Care Screening - Complete  Medication Review Press photographer) Complete Complete  PCP or Specialist appointment within 3-5 days of discharge Complete -  [place] or Home Care Consult Complete -  Palliative Care Screening Not Applicable -  [place] Not Applicable -  Some recent data might be hidden

## 2020-09-25 NOTE — Plan of Care (Signed)
Pt orientedx4, remains on 4L [place]. X1 duoneb given for wheezing/SOB, pt reported relief. Continued hemoptysis overnight. X1 BM this shift, adequate UO. ST on telemetry overnight with rates up to 110s, notably higher than previous HR of 60-80; MD J. Mansy notified and bolus ordered and given with HR returning to 80s. Fall/safety precautions in place, rounding performed, needs/concerns addressed.  Problem: Education: Goal: Knowledge of General Education information will improve Description: Including pain rating scale, medication(s)/side effects and non-pharmacologic comfort measures Outcome: Progressing   Problem: Health Behavior/Discharge Planning: Goal: Ability to manage health-related needs will improve Outcome: Progressing   Problem: Clinical Measurements: Goal: Ability to maintain clinical measurements within normal limits will improve Outcome: Progressing Goal: Will remain free from infection Outcome: Progressing Goal: Diagnostic test results will improve Outcome: Progressing Goal: Respiratory complications will improve Outcome: Progressing Goal: Cardiovascular complication will be avoided Outcome: Progressing   Problem: Activity: Goal: Risk for activity intolerance will decrease Outcome: Progressing   Problem: Nutrition: Goal: Adequate nutrition will be maintained Outcome: Progressing   Problem: Coping: Goal: Level of anxiety will decrease Outcome: Progressing   Problem: Elimination: Goal: Will not experience complications related to bowel motility Outcome: Progressing Goal: Will not experience complications related to urinary retention Outcome: Progressing   Problem: Pain Managment: Goal: General experience of comfort will improve Outcome: Progressing   Problem: Safety: Goal: Ability to remain free from injury will improve Outcome: Progressing   Problem: Skin Integrity: Goal: Risk for impaired skin integrity will decrease Outcome: Progressing

## 2020-09-25 NOTE — Progress Notes (Signed)
PT Cancellation Note  Patient Details Name: Marcus [name] MRN: [id number] DOB: 19-May-1960   Cancelled Treatment:    Reason Eval/Treat Not Completed: Patient declined, no reason specified Pt eating dinner on arrival.  Thanks PT for coming by but states that he has been walking a lot today and does not feel like doing more at this time.  We did discuss how he felt about his mobility and he states that he honestly feels like he can manage at home and assures me that he will not have to additionally help his mother if he were to go home.  He admits he is not at his baseline but again is confident that based on his mobility today he will be safe to return home.  Will maintain on PT caseload and continue to follow as appropriate.  Kreg Shropshire, DPT 09/25/2020, 5:42 PM

## 2020-09-25 NOTE — Progress Notes (Signed)
Pulmonary Medicine          Date: 09/25/2020,   MRN# [id number] Marcus [name] [birth year]     AdmissionWeight: 63.7 kg                 CurrentWeight: 57.5 kg      CHIEF COMPLAINT:      HISTORY OF PRESENT ILLNESS   60 yo M w/ hx of COPD, Feltys syndrome (enlarged spleen, low WBC count associated with RA), epigastric hernia and rheumatoid arthritis.  Patient had admission 01/2020 to [facility] [place] with findings of pneumoperitoneum he was intubated during this time, had procedure done with graham patch incision hernia repair s/p exlap. He had MRSA pneumonia 08/2020, he has been on antibiotics with doxycycline on outpatient. He came in to ER with complaints of hemoptysis.  PCCM consultation for additional evaluation and management. Patient reports having visual symptoms and seizure like activity few weeks ago. NEURO w/u on going.    09/19/20- patient seems slightly improved, his microbiology is still in process. Currently on empiric therapy.  MRSA is +.  Hemoptysis has slowed down.   09/20/20- MRSA + pneumonia/abscess, now on linozolid, bronch to r/o involvement (aspergillosis.vs malignancy etc)t, radiography has failed prior appropriate therapy.    09/22/20- patient is still hemoptysizing.  He is on room air and feels weak. No changes to medical plan. Resp cx with Candida dublisiensis have initiated duflucan IV, will ask pharm/ID team to reivew meds and narrow   09/23/20- patient is worse with increased O2 requirement, we reviewed CT and CXR together.  Broadening of regimen with vancomycin and zosyn.   09/24/20- patient is improved, he still coughs with hemoptysis.  Fungitell is negative but resp culture is + for fungus and candida.  MRSA +   09/25/20-  still spitting up blood, sputum positive Candida dublisiensis. cxr today some possible improvement   PAST MEDICAL HISTORY   Past Medical History:  Diagnosis Date  . COPD (chronic obstructive pulmonary disease) ([place])   . Felty  syndrome ([place])   . Hernia, epigastric   . Pancytopenia ([place])   . Seropositive rheumatoid arthritis ([place])   . Tobacco use disorder      SURGICAL HISTORY   Past Surgical History:  Procedure Laterality Date  . INCISIONAL HERNIA REPAIR  01/17/2020   Procedure: HERNIA REPAIR INCISIONAL AND Silvestre Gunner;  Surgeon: Kinsinger, Arta Bruce, MD;  Location: [facility];  Service: General;;  . LAPAROTOMY N/A 01/17/2020   Procedure: EXPLORATORY LAPAROTOMY;  Surgeon: Kieth Brightly Arta Bruce, MD;  Location: [facility];  Service: General;  Laterality: N/A;     FAMILY HISTORY   Family History  Problem Relation Age of Onset  . CAD Brother   . COPD Brother      SOCIAL HISTORY   Social History   Tobacco Use  . Smoking status: Every Day    Packs/day: 1.00    Pack years: 0.00    Types: Cigarettes  . Smokeless tobacco: Never  Substance Use Topics  . Alcohol use: No    Alcohol/week: 0.0 standard drinks  . Drug use: Yes    Types: Marijuana     MEDICATIONS    Home Medication:    Current Medication:  Current Facility-Administered Medications:  .  acetaminophen (TYLENOL) tablet 650 mg, 650 mg, Oral, Q6H PRN, 650 mg at 09/21/20 1925 **OR** acetaminophen (TYLENOL) suppository 650 mg, 650 mg, Rectal, Q6H PRN, Para Skeans, MD .  alum & mag hydroxide-simeth (MAALOX/MYLANTA) 200-200-20 MG/5ML suspension  30 mL, 30 mL, Oral, Q4H PRN, Lorella Nimrod, MD, 30 mL at 09/25/20 0028 .  benzonatate (TESSALON) capsule 200 mg, 200 mg, Oral, BID PRN, Athena Masse, MD, 200 mg at 09/25/20 0840 .  Chlorhexidine Gluconate Cloth 2 % PADS 6 each, 6 each, Topical, Q0600, Lorella Nimrod, MD, 6 each at 09/25/20 0522 .  chlorpheniramine-HYDROcodone (TUSSIONEX) 10-8 MG/5ML suspension 5 mL, 5 mL, Oral, Q12H PRN, Athena Masse, MD, 5 mL at 09/25/20 0840 .  feeding supplement (ENSURE ENLIVE / ENSURE PLUS) liquid 237 mL, 237 mL, Oral, TID BM, Lorella Nimrod, MD, 237 mL at 09/25/20 1218 .  guaiFENesin (MUCINEX) 12 hr tablet  600 mg, 600 mg, Oral, BID, Mansy, Jan A, MD, 600 mg at 09/25/20 0840 .  ipratropium-albuterol (DUONEB) 0.5-2.5 (3) MG/3ML nebulizer solution 3 mL, 3 mL, Nebulization, Q4H PRN, Athena Masse, MD, 3 mL at 09/24/20 2023 .  linezolid (ZYVOX) tablet 600 mg, 600 mg, Oral, Q12H, Ravishankar, Jayashree, MD, 600 mg at 09/25/20 0840 .  metroNIDAZOLE (FLAGYL) tablet 500 mg, 500 mg, Oral, BID, Ravishankar, Jayashree, MD, 500 mg at 09/25/20 0840 .  multivitamin with minerals tablet 1 tablet, 1 tablet, Oral, Daily, Lorella Nimrod, MD, 1 tablet at 09/25/20 0840 .  mupirocin ointment (BACTROBAN) 2 %, , Nasal, BID, Lorella Nimrod, MD, Given at 09/25/20 0840 .  nicotine (NICODERM CQ - dosed in mg/24 hours) patch 21 mg, 21 mg, Transdermal, Daily, Florina Ou V, MD, 21 mg at 09/25/20 0840 .  predniSONE (DELTASONE) tablet 10 mg, 10 mg, Oral, BID WC, Para Skeans, MD, 10 mg at 09/25/20 0840 .  Tbo-Filgrastim (GRANIX) injection 480 mcg, 480 mcg, Subcutaneous, ONCE-1800, Brahmanday, Elisha Headland, MD    ALLERGIES   Levaquin [levofloxacin in d5w] and Methotrexate     REVIEW OF SYSTEMS    Review of Systems:  Gen:  Denies  fever, sweats, chills weigh loss  HEENT: Denies blurred vision, double vision, ear pain, eye pain, hearing loss, nose bleeds, sore throat Cardiac:  No dizziness, chest pain or heaviness, chest tightness,edema Resp:   Denies cough or sputum porduction, shortness of breath,wheezing, hemoptysis,  Gi: Denies swallowing difficulty, stomach pain, nausea or vomiting, diarrhea, constipation, bowel incontinence Gu:  Denies bladder incontinence, burning urine Ext:   Denies Joint pain, stiffness or swelling Skin: Denies  skin rash, easy bruising or bleeding or hives Endoc:  Denies polyuria, polydipsia , polyphagia or weight change Psych:   Denies depression, insomnia or hallucinations   Other:  All other systems negative   VS: BP 108/68 (BP Location: Right Arm)   Pulse 90   Temp (!) 97.5 F (36.4  C)   Resp 16   Ht 5' 5.98" (1.676 m)   Wt 57.5 kg   SpO2 97%   BMI 20.47 kg/m      PHYSICAL EXAM    GENERAL:NAD, no fevers, chills, no weakness no fatigue HEAD: Normocephalic, atraumatic.  EYES: Pupils equal, round, reactive to light. Extraocular muscles intact. No scleral icterus.  MOUTH: Moist mucosal membrane. Dentition intact. No abscess noted.  EAR, NOSE, THROAT: Clear without exudates. No external lesions.  NECK: Supple. No thyromegaly. No nodules. No JVD.  PULMONARY: Diffuse coarse rhonchi right sided +wheezes CARDIOVASCULAR: S1 and S2. Regular rate and rhythm. No murmurs, rubs, or gallops. No edema. Pedal pulses 2+ bilaterally.  GASTROINTESTINAL: Soft, nontender, nondistended. No masses. Positive bowel sounds. No hepatosplenomegaly.  MUSCULOSKELETAL: No swelling, clubbing, or edema. Range of motion full in all extremities.  NEUROLOGIC: Cranial nerves  II through XII are intact. No gross focal neurological deficits. Sensation intact. Reflexes intact.  SKIN: No ulceration, lesions, rashes, or cyanosis. Skin warm and dry. Turgor intact.  PSYCHIATRIC: Mood, affect within normal limits. The patient is awake, alert and oriented x 3. Insight, judgment intact.       IMAGING    DG Chest 2 View  Result Date: 09/25/2020 CLINICAL DATA:  Cough.  Follow-up treatment. EXAM: CHEST - 2 VIEW COMPARISON:  CT 09/17/2020.  Chest x-ray 09/17/2020. FINDINGS: Mediastinum and hilar structures appears stable. Prominent cavitary infiltrate/mass is again noted in the anterior medial aspect of the right lung. Slight improvement from prior exams may be present. Mild adjacent infiltrate also again noted. Continued follow-up exam suggested to demonstrate complete resolution. No scratched it tiny right pleural effusion cannot be excluded. No pneumothorax. Old right rib fractures again noted. Degenerative change thoracic spine. IMPRESSION: Prominent cavitary infiltrate/mass is again noted in the  anteromedial aspect of the right lung. Slight improvement from prior exam may be present. Mild adjacent infiltrate also again noted. Continued follow-up exam suggested to demonstrate complete resolution. Electronically Signed   By: Marcello Moores  Register   On: 09/25/2020 11:48   DG Chest 2 View  Result Date: 09/06/2020 CLINICAL DATA:  Shortness of breath.  Cough. EXAM: CHEST - 2 VIEW COMPARISON:  Chest x-ray 08/28/2020.  CT 08/22/2020. FINDINGS: Mediastinum unremarkable. Heart size normal. Cavitary process in the right mid lung again noted. No change identified. No pleural effusion or pneumothorax. Old right rib fractures. IMPRESSION: Cavitary process in the right mid lung again noted. Cavitary process best identified by prior CT. Chest is unchanged from prior exam. Electronically Signed   By: Marcello Moores  Register   On: 09/06/2020 14:04   CT Angio Chest PE W and/or Wo Contrast  Result Date: 09/17/2020 CLINICAL DATA:  Hemoptysis, shortness of breath, abdominal distension EXAM: CT ANGIOGRAPHY CHEST CT ABDOMEN AND PELVIS WITH CONTRAST TECHNIQUE: Multidetector CT imaging of the chest was performed using the standard protocol during bolus administration of intravenous contrast. Multiplanar CT image reconstructions and MIPs were obtained to evaluate the vascular anatomy. Multidetector CT imaging of the abdomen and pelvis was performed using the standard protocol during bolus administration of intravenous contrast. CONTRAST:  2mL OMNIPAQUE IOHEXOL 350 MG/ML SOLN COMPARISON:  CT 06/20/2020, 06/22/2020 FINDINGS: CTA CHEST FINDINGS Cardiovascular: Satisfactory opacification of the pulmonary arteries to the segmental level. No evidence of pulmonary embolism. Thoracic aorta is normal in course and caliber. Atherosclerotic calcifications of the aorta and coronary arteries. Normal heart size. No pericardial effusion. Mediastinum/Nodes: Mildly prominent mediastinal lymph nodes are again noted, likely reactive. No axillary or hilar  lymphadenopathy. Lungs/Pleura: Redemonstrated cavitary mass located within the inferomedial aspect of the right upper lobe measuring approximately 9.9 x 6.6 x 7.4 cm (previously measured approximately 7.8 x 4.5 x 5.5 cm on 08/22/2020. Increased surrounding consolidation and ground-glass opacity within the adjacent right upper lobe and right middle lobe previously seen cavitary lesion within the medial aspect of the left upper lobe has resolved with small thin walled pleural cyst remaining (series 6, image 44). Scattered areas of ground-glass opacity within the bilateral lower lobes and upper lobes. Background of moderate emphysema. No pleural effusion or pneumothorax. Musculoskeletal: Remote right sixth and seventh rib fractures. No new or acute osseous findings. No chest wall abnormality. Review of the MIP images confirms the above findings. CT ABDOMEN and PELVIS FINDINGS Hepatobiliary: Cholelithiasis without evidence of pericholecystic inflammation by CT. Liver within normal limits. No biliary dilatation. Pancreas: Unremarkable.  No pancreatic ductal dilatation or surrounding inflammatory changes. Spleen: Prior splenectomy. Adrenals/Urinary Tract: Unremarkable adrenal glands. Kidneys enhance symmetrically without focal lesion, stone, or hydronephrosis. Ureters are nondilated. Urinary bladder appears unremarkable. Stomach/Bowel: Stomach is within normal limits. Appendix appears normal (series 2, image 58). Incidental note of a small bowel-small bowel intussusception within the mid left abdomen (series 2, image 38). No evidence of bowel wall thickening, distention, or inflammatory changes. Vascular/Lymphatic: Fusiform infrarenal abdominal aortic aneurysm measuring up to 3.1 cm, unchanged. Aortoiliac atherosclerosis. No abdominopelvic lymphadenopathy. Reproductive: Prostate is unremarkable. Other: No free fluid. No abdominopelvic fluid collection. No pneumoperitoneum. Fat containing midline supraumbilical hernia  without evidence of complication. Musculoskeletal: No acute or significant osseous findings. Review of the MIP images confirms the above findings. IMPRESSION: 1. No evidence of acute pulmonary embolism. 2. Enlarging cavitary mass within the inferomedial aspect of the right upper lobe measuring up to 9.9 cm (previously measured approximately 7.8 x 4.5 x 5.5 cm on 08/22/2020). Increased surrounding consolidation and ground-glass opacity within the adjacent right upper lobe and right middle lobe. Findings remain most compatible with cavitary/necrotizing pneumonia. 3. Scattered areas of ground-glass opacity within the bilateral lower lobes and upper lobes, concerning for multifocal infection. 4. Interval resolution of previously seen cavitary lesion within the medial aspect of the left upper lobe with small thin walled pleural cyst remaining. 5. Cholelithiasis without evidence of pericholecystic inflammation by CT. 6. Incidental note of a small bowel-small bowel intussusception within the mid left abdomen. No evidence of bowel obstruction. 7. Fat containing midline supraumbilical hernia without evidence of complication. 8. Stable infrarenal abdominal aortic aneurysm measuring up to 3.1 cm. Recommend follow-up ultrasound every 3 years. This recommendation follows ACR consensus guidelines: White Paper of the ACR Incidental Findings Committee II on Vascular Findings. J Am Coll Radiol 2013; 10:789-794. Aortic Atherosclerosis (ICD10-I70.0) and Emphysema (ICD10-J43.9). Electronically Signed   By: Davina Poke D.O.   On: 09/17/2020 16:38   MR BRAIN W WO CONTRAST  Result Date: 09/18/2020 CLINICAL DATA:  Brain mass or lesion. 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 Recent MRSA pneumonia. Patient reports visual symptoms and seizure-like activity a few weeks ago. EXAM: MRI HEAD WITHOUT AND WITH CONTRAST TECHNIQUE: Multiplanar, multiecho pulse sequences of  the brain and surrounding structures were obtained without and with intravenous contrast. CONTRAST:  46mL GADAVIST GADOBUTROL 1 MMOL/ML IV SOLN COMPARISON:  No pertinent prior exams available for comparison. FINDINGS: Brain: Intermittently motion degraded examination, limiting evaluation. Most notably, there is moderate/severe motion degradation of the sagittal T1 weighted sequence, moderate/severe motion degradation of the axial T2/FLAIR sequence, moderate motion degradation of the axial T1 weighted postcontrast sequence and moderate motion degradation of the coronal T1 weighted postcontrast sequence. Mild generalized cerebral and cerebellar atrophy. Advanced patchy and confluent T2/FLAIR hyperintensity within the cerebral white matter, nonspecific but most often secondary to chronic small vessel ischemia. Chronic lacunar infarct within the left lentiform nucleus. Small foci of SWI signal loss within the left basal ganglia and along the margin of the posterior right lateral ventricle, likely reflecting chronic microhemorrhages. Additional subcentimeter focus of SWI signal loss within the midline cerebellum, which may reflect an additional chronic microhemorrhage or cavernoma. There is no acute infarct. No evidence of intracranial mass. No extra-axial fluid collection. No midline shift. Within the limitations of motion degradation, no abnormal intracranial enhancement is identified. Vascular: Expected proximal arterial flow voids. Non dominant intracranial right vertebral artery. Skull and upper cervical spine: Within the limitations of  motion degradation, no focal marrow lesion is identified. Sinuses/Orbits: Visualized orbits show no acute finding. 1.9 cm right maxillary sinus mucous retention cyst. Trace left maxillary sinus mucosal thickening. Other: Trace bilateral mastoid effusions. 2.8 cm ovoid lesion within the left suboccipital scalp demonstrating heterogeneous but predominantly T2 hyperintense signal, T1  hypointense signal, restricted diffusion and no convincing abnormal enhancement. This is favored to reflect a sebaceous/epidermoid cyst. IMPRESSION: Motion degraded examination, as described and limiting evaluation. No evidence of acute intracranial abnormality. Severe patchy and confluent T2/FLAIR hyperintensity within the cerebral white matter, nonspecific but most often secondary to chronic small vessel ischemia. Chronic left basal ganglia lacunar infarct. Subcentimeter focus of SWI signal loss within the midline cerebellum, which may reflect a chronic microhemorrhage or cavernoma. Mild generalized parenchymal atrophy. Incidentally noted 1.9 cm right maxillary sinus mucous retention cyst. Trace bilateral mastoid effusions. Electronically Signed   By: Kellie Simmering DO   On: 09/18/2020 15:59   MR CERVICAL SPINE W WO CONTRAST  Result Date: 09/19/2020 CLINICAL DATA:  Acute or progressive myelopathy. EXAM: MRI CERVICAL SPINE WITHOUT AND WITH CONTRAST TECHNIQUE: Multiplanar and multiecho pulse sequences of the cervical spine, to include the craniocervical junction and cervicothoracic junction, were obtained without and with intravenous contrast. CONTRAST:  35mL GADAVIST GADOBUTROL 1 MMOL/ML IV SOLN COMPARISON:  No previous cervical imaging. FINDINGS: Alignment: Straightening of the normal cervical lordosis. 1 mm degenerative anterolisthesis C7-T1. Vertebrae: No fracture or focal bone lesion. Cord: No primary cord lesion.  See below regarding stenosis. Posterior Fossa, vertebral arteries, paraspinal tissues: Large sebaceous cyst of the upper left neck. Disc levels: Foramen magnum is widely patent.  C1-2 and C2-3 are normal. C3-4: Endplate osteophytes and bulging of the disc. Canal stenosis with AP diameter in the midline 6 mm. Effacement of the subarachnoid space and slight indentation of the cord. Moderate bilateral foraminal narrowing. C4-5: Endplate osteophytes and protrusion of the disc more prominent towards the  left. Effacement of the subarachnoid space and cord deformity, worse on the left. AP diameter of the canal 5.9 mm. Bilateral foraminal stenosis. C5-6: Spondylosis with endplate osteophytes and bulging of the disc. Canal narrowing with AP diameter in the midline 8.3 mm. No compressive effect upon the cord. Bilateral foraminal narrowing. C6-7: Mild bulging of the disc. No canal stenosis. Foraminal narrowing on the right that could affect the exiting C7 nerve. C7-T1: Facet osteoarthritis with 1 mm of anterolisthesis. No stenosis of the canal or foramina. 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 AP diameter of the canal at C3-4 6 mm. AP diameter of the spinal canal at C4-5 5.9 mm. Effacement of the subarachnoid space with some indentation of the cord. No abnormal cord T2 signal however. Foraminal stenosis that could cause neural compression on either side at C3-4, either side at C4-5, either side at C5-6 and on the right C6-7. Electronically Signed   By: Nelson Chimes M.D.   On: 09/19/2020 18:01   MR THORACIC SPINE W WO CONTRAST  Result Date: 09/19/2020 CLINICAL DATA:  Acute or progressive myelopathy. EXAM: MRI THORACIC WITHOUT AND WITH CONTRAST TECHNIQUE: Multiplanar and multiecho pulse sequences of the thoracic spine were obtained without and with intravenous contrast. CONTRAST:  68mL GADAVIST GADOBUTROL 1 MMOL/ML IV SOLN COMPARISON:  Cervical study same day FINDINGS: Alignment:  Normal Vertebrae: No fracture or focal bone lesion. Cord: No cord compression or primary cord lesion. Spinal canal widely patent throughout the region. No evidence of arteriovenous malformation. Paraspinal and other soft tissues: Negative Disc  levels: No significant disc pathology in the thoracic region. Wide patency of the canal and foramina. Ordinary mild facet osteoarthritis without edema or enhancement. No encroachment upon the neural spaces. IMPRESSION: Negative thoracic study. No cord compression or  cord lesion. No significant degenerative changes. Electronically Signed   By: Nelson Chimes M.D.   On: 09/19/2020 18:03   CT ABDOMEN PELVIS W CONTRAST  Result Date: 09/17/2020 CLINICAL DATA:  Hemoptysis, shortness of breath, abdominal distension EXAM: CT ANGIOGRAPHY CHEST CT ABDOMEN AND PELVIS WITH CONTRAST TECHNIQUE: Multidetector CT imaging of the chest was performed using the standard protocol during bolus administration of intravenous contrast. Multiplanar CT image reconstructions and MIPs were obtained to evaluate the vascular anatomy. Multidetector CT imaging of the abdomen and pelvis was performed using the standard protocol during bolus administration of intravenous contrast. CONTRAST:  49mL OMNIPAQUE IOHEXOL 350 MG/ML SOLN COMPARISON:  CT 06/20/2020, 06/22/2020 FINDINGS: CTA CHEST FINDINGS Cardiovascular: Satisfactory opacification of the pulmonary arteries to the segmental level. No evidence of pulmonary embolism. Thoracic aorta is normal in course and caliber. Atherosclerotic calcifications of the aorta and coronary arteries. Normal heart size. No pericardial effusion. Mediastinum/Nodes: Mildly prominent mediastinal lymph nodes are again noted, likely reactive. No axillary or hilar lymphadenopathy. Lungs/Pleura: Redemonstrated cavitary mass located within the inferomedial aspect of the right upper lobe measuring approximately 9.9 x 6.6 x 7.4 cm (previously measured approximately 7.8 x 4.5 x 5.5 cm on 08/22/2020. Increased surrounding consolidation and ground-glass opacity within the adjacent right upper lobe and right middle lobe previously seen cavitary lesion within the medial aspect of the left upper lobe has resolved with small thin walled pleural cyst remaining (series 6, image 44). Scattered areas of ground-glass opacity within the bilateral lower lobes and upper lobes. Background of moderate emphysema. No pleural effusion or pneumothorax. Musculoskeletal: Remote right sixth and seventh rib  fractures. No new or acute osseous findings. No chest wall abnormality. Review of the MIP images confirms the above findings. CT ABDOMEN and PELVIS FINDINGS Hepatobiliary: Cholelithiasis without evidence of pericholecystic inflammation by CT. Liver within normal limits. No biliary dilatation. Pancreas: Unremarkable. No pancreatic ductal dilatation or surrounding inflammatory changes. Spleen: Prior splenectomy. Adrenals/Urinary Tract: Unremarkable adrenal glands. Kidneys enhance symmetrically without focal lesion, stone, or hydronephrosis. Ureters are nondilated. Urinary bladder appears unremarkable. Stomach/Bowel: Stomach is within normal limits. Appendix appears normal (series 2, image 58). Incidental note of a small bowel-small bowel intussusception within the mid left abdomen (series 2, image 38). No evidence of bowel wall thickening, distention, or inflammatory changes. Vascular/Lymphatic: Fusiform infrarenal abdominal aortic aneurysm measuring up to 3.1 cm, unchanged. Aortoiliac atherosclerosis. No abdominopelvic lymphadenopathy. Reproductive: Prostate is unremarkable. Other: No free fluid. No abdominopelvic fluid collection. No pneumoperitoneum. Fat containing midline supraumbilical hernia without evidence of complication. Musculoskeletal: No acute or significant osseous findings. Review of the MIP images confirms the above findings. IMPRESSION: 1. No evidence of acute pulmonary embolism. 2. Enlarging cavitary mass within the inferomedial aspect of the right upper lobe measuring up to 9.9 cm (previously measured approximately 7.8 x 4.5 x 5.5 cm on 08/22/2020). Increased surrounding consolidation and ground-glass opacity within the adjacent right upper lobe and right middle lobe. Findings remain most compatible with cavitary/necrotizing pneumonia. 3. Scattered areas of ground-glass opacity within the bilateral lower lobes and upper lobes, concerning for multifocal infection. 4. Interval resolution of previously  seen cavitary lesion within the medial aspect of the left upper lobe with small thin walled pleural cyst remaining. 5. Cholelithiasis without evidence of pericholecystic inflammation by CT.  6. Incidental note of a small bowel-small bowel intussusception within the mid left abdomen. No evidence of bowel obstruction. 7. Fat containing midline supraumbilical hernia without evidence of complication. 8. Stable infrarenal abdominal aortic aneurysm measuring up to 3.1 cm. Recommend follow-up ultrasound every 3 years. This recommendation follows ACR consensus guidelines: White Paper of the ACR Incidental Findings Committee II on Vascular Findings. J Am Coll Radiol 2013; 10:789-794. Aortic Atherosclerosis (ICD10-I70.0) and Emphysema (ICD10-J43.9). Electronically Signed   By: Davina Poke D.O.   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 There is increase in consolidation in this area compared to most recent study. Lungs elsewhere clear. Heart size and pulmonary vascular normal. No adenopathy appreciable. No bone lesions. IMPRESSION: Cavitary mass right middle lobe anteriorly with increase in surrounding consolidation compared to most recent study. The lungs elsewhere are clear. Heart size normal. No adenopathy appreciable by radiography. Electronically Signed   By: Lowella Grip III M.D.   On: 09/17/2020 13:54   DG Chest Port 1 View  Result Date: 08/28/2020 CLINICAL DATA:  Hypoxia. EXAM: PORTABLE CHEST 1 VIEW COMPARISON:  CT 08/22/2020.  Chest x-ray 08/22/2020. FINDINGS: Mediastinum hilar structures normal. Heart size normal. Cavitary process in the right mid lung and left upper lung best identified by prior CT. No new findings are noted.  No pleural effusion or pneumothorax. Heart size stable. Degenerative change thoracic spine. Old right rib fractures present. IMPRESSION: Cavitary process in the right mid lung and left upper lung best identified by prior CT. Chest is unchanged from prior exam. Electronically Signed   By: Marcello Moores  Register   On: 08/28/2020 08:25   DG Swallowing Func-Speech Pathology  Result Date: 09/06/2020 Please refer to "Notes" tab for Speech Pathology notes.  ECHOCARDIOGRAM COMPLETE  Result Date: 08/28/2020    ECHOCARDIOGRAM REPORT   Patient Name:   Marcus [name] [facility] Date of Exam: 08/28/2020 Medical Rec #:  [id number]    Height:       66.0 in Accession #:    [id number]   Weight:       133.6 lb Date of Birth:  April 28, 1960     BSA:          1.685 m Patient Age:    56 years     BP:           120/74 mmHg Patient Gender: M            HR:           76 bpm. 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 COPD. Tobacco use disorder.  Sonographer:     Sherrie Sport RDCS (AE) Referring Phys:  [id number] Sharen Hones Diagnosing Phys: Nelva Bush MD  Sonographer Comments: Technically challenging study due to limited acoustic windows. Image acquisition challenging due to COPD. IMPRESSIONS  1. Left ventricular ejection fraction, by estimation, is 55 to 60%. The left ventricle has normal function. Left ventricular endocardial border not optimally defined  to evaluate regional wall motion. Left ventricular diastolic parameters are consistent with Grade I diastolic dysfunction (impaired relaxation).  2. Right ventricular systolic function is normal. The right ventricular size is mildly enlarged. Mildly increased right ventricular wall thickness. There is normal pulmonary artery systolic pressure.  3. The mitral valve is degenerative. Trivial mitral valve regurgitation. No evidence  of mitral stenosis.  4. The aortic valve was not well visualized. Aortic valve regurgitation is not visualized. No aortic stenosis is present.  5. Pulmonic valve regurgitation not well assessed.  6. The inferior vena cava is normal in size with greater than 50% respiratory variability, suggesting right atrial pressure of 3 mmHg. Conclusion(s)/Recommendation(s): Valves suboptimally imaged to exclude endocarditis. If clinical concern persists, transesophageal echocardiogram should be considered. FINDINGS  Left Ventricle: Left ventricular ejection fraction, by estimation, is 55 to 60%. The left ventricle has normal function. Left ventricular endocardial border not optimally defined to evaluate regional wall motion. The left ventricular internal cavity size was normal in size. There is no left ventricular hypertrophy. Left ventricular diastolic parameters are consistent with Grade I diastolic dysfunction (impaired relaxation). Right Ventricle: The right ventricular size is mildly enlarged. Mildly increased right ventricular wall thickness. Right ventricular systolic function is normal. There is normal pulmonary artery systolic pressure. The tricuspid regurgitant velocity is 1.72 m/s, and with an assumed right atrial pressure of 3 mmHg, the estimated right ventricular systolic pressure is 46.5 mmHg. Left Atrium: Left atrial size was normal in size. Right Atrium: Right atrial size was normal in size. Pericardium: There is no evidence of pericardial effusion. Mitral Valve: The mitral valve is degenerative in appearance. There is mild thickening of the mitral valve leaflet(s). There is mild calcification of the mitral valve leaflet(s). Mild mitral annular calcification. Trivial mitral valve regurgitation. No evidence of mitral valve stenosis. Tricuspid Valve: The tricuspid valve is grossly normal. Tricuspid valve regurgitation is trivial. Aortic Valve: The aortic valve was not well visualized. Aortic valve regurgitation is  not visualized. No aortic stenosis is present. Aortic valve mean gradient measures 3.0 mmHg. Aortic valve peak gradient measures 4.8 mmHg. Aortic valve area, by VTI measures 2.04 cm. Pulmonic Valve: The pulmonic valve was not well visualized. Pulmonic valve regurgitation not well assessed. Aorta: The aortic root is normal in size and structure. Pulmonary Artery: The pulmonary artery is not well seen. Venous: The inferior vena cava is normal in size with greater than 50% respiratory variability, suggesting right atrial pressure of 3 mmHg. IAS/Shunts: The interatrial septum was not well visualized.  LEFT VENTRICLE PLAX 2D LVIDd:         5.38 cm  Diastology LVIDs:         2.94 cm  LV e' medial:    6.09 cm/s LV PW:         1.02 cm  LV E/e' medial:  10.3 LV IVS:        0.73 cm  LV e' lateral:   11.40 cm/s LVOT diam:     2.10 cm  LV E/e' lateral: 5.5 LV SV:         45 LV SV Index:   27 LVOT Area:     3.46 cm  RIGHT VENTRICLE RV Basal diam:  4.23 cm RV S prime:     9.46 cm/s TAPSE (M-mode): 2.0 cm LEFT ATRIUM             Index       RIGHT ATRIUM  Index LA diam:        3.50 cm 2.08 cm/m  RA Area:     16.50 cm LA Vol (A2C):   30.0 ml 17.81 ml/m RA Volume:   44.10 ml  26.18 ml/m LA Vol (A4C):   39.3 ml 23.33 ml/m LA Biplane Vol: 35.6 ml 21.13 ml/m  AORTIC VALVE                   PULMONIC VALVE AV Area (Vmax):    1.89 cm    PV Vmax:        0.88 m/s AV Area (Vmean):   2.10 cm    PV Peak grad:   3.1 mmHg AV Area (VTI):     2.04 cm    RVOT Peak grad: 3 mmHg AV Vmax:           110.00 cm/s AV Vmean:          74.000 cm/s AV VTI:            0.219 m AV Peak Grad:      4.8 mmHg AV Mean Grad:      3.0 mmHg LVOT Vmax:         60.10 cm/s LVOT Vmean:        44.800 cm/s LVOT VTI:          0.129 m LVOT/AV VTI ratio: 0.59  AORTA Ao Root diam: 3.60 cm MITRAL VALVE               TRICUSPID VALVE MV Area (PHT): 4.86 cm    TR Peak grad:   11.8 mmHg MV Decel Time: 156 msec    TR Vmax:        172.00 cm/s MV E velocity: 62.60  cm/s MV A velocity: 86.50 cm/s  SHUNTS MV E/A ratio:  0.72        Systemic VTI:  0.13 m                            Systemic Diam: 2.10 cm Nelva Bush MD Electronically signed by Nelva Bush MD Signature Date/Time: 08/28/2020/9:56:00 AM    Final       ASSESSMENT/PLAN   Presented in respiratory failure, non massive hemoptysis,  enlarging RUL density with crescent air sign. 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 Meds adjusted. On linezolid.The hemoptysis is from the infection (on going necrosis). His neutropenia improved on Granix.  Heme involved. Candida in sputum ? Oral Colonization, relatively immunocompromised. Aspergillosis studies negative, fungitell negative -continue present antibiotic regimen -incentive spirometry -Since he is clinically looking better and the cxr is improving, will hold on bronch  relative contraindication doing a bronch in some one with a potential abscess) and t-surg consult. However, will notify t-surg of the case      Thank you for allowing me to participate in the care of this patient.   Patient/Family are satisfied with care plan and all questions have been answered.  This document was prepared using Dragon voice recognition software and may include unintentional dictation errors.     Wallene Huh, M.D.  Division of [place]

## 2020-09-26 DIAGNOSIS — J9621 Acute and chronic respiratory failure with hypoxia: Secondary | ICD-10-CM | POA: Diagnosis not present

## 2020-09-26 LAB — CBC WITH DIFFERENTIAL/PLATELET
Abs Immature Granulocytes: 0.12 10*3/uL — ABNORMAL HIGH (ref 0.00–0.07)
Basophils Absolute: 0.1 10*3/uL (ref 0.0–0.1)
Basophils Relative: 1 %
Eosinophils Absolute: 0 10*3/uL (ref 0.0–0.5)
Eosinophils Relative: 0 %
HCT: 33.2 % — ABNORMAL LOW (ref 39.0–52.0)
Hemoglobin: 10.5 g/dL — ABNORMAL LOW (ref 13.0–17.0)
Immature Granulocytes: 1 %
Lymphocytes Relative: 11 %
Lymphs Abs: 1.2 10*3/uL (ref 0.7–4.0)
MCH: 27.6 pg (ref 26.0–34.0)
MCHC: 31.6 g/dL (ref 30.0–36.0)
MCV: 87.4 fL (ref 80.0–100.0)
Monocytes Absolute: 3.5 10*3/uL — ABNORMAL HIGH (ref 0.1–1.0)
Monocytes Relative: 33 %
Neutro Abs: 5.7 10*3/uL (ref 1.7–7.7)
Neutrophils Relative %: 54 %
Platelets: 199 10*3/uL (ref 150–400)
RBC: 3.8 MIL/uL — ABNORMAL LOW (ref 4.22–5.81)
RDW: 20.8 % — ABNORMAL HIGH (ref 11.5–15.5)
WBC: 10.6 10*3/uL — ABNORMAL HIGH (ref 4.0–10.5)
nRBC: 1.5 % — ABNORMAL HIGH (ref 0.0–0.2)

## 2020-09-26 LAB — ASPERGILLUS ANTIGEN, BAL/SERUM: Aspergillus Ag, BAL/Serum: 0.04 Index (ref 0.00–0.49)

## 2020-09-26 NOTE — Progress Notes (Addendum)
[facility] PROGRESS NOTE    Marcus [name]  HKV:[id number] DOB: Mar 05, 1961 DOA: 09/17/2020 PCP: [name], FNP    Brief Narrative:  [name] is a 60 y.o. M with RA c/b Felty syndrome/neutropenia, smoking, and COPD as well as recently diagnosed MRSA abscess who presents with failure of outpatient treamtent.  Patient admitted last month for 8 days for cough and shortness of breath as well as hemoptysis found to have a large cavitary lesion in the right upper lobe concerning for lung abscess.  Sputum was positive for MRSA and so he was discharged on 6 weeks of oral doxycycline.  He reports adherence to his medication after some initial improvement with IV vancomycin in the hospital, he then started to get persistently worse, finally was coughing up blood and had chest pain and malaise/fatigue, so he came to the ER.  He was started on linezolid by ID, continue to have some hemoptysis, pulmonology is recommending bronchoscopy and a possible surgery.  EEG was negative for any seizure-like activity.  Imaging with concern of spinal stenosis but no emergent need for any intervention.  Neurology is recommending outpatient neurosurgery evaluation once improved from current hospitalization.  If patient developed any incontinence or worsening focal weakness then neurosurgery should be consulted urgently.  Patient was started on Granix due to worsening neutropenia , received 2 doses which resulted in markedly elevated leukocytosis and neutrophils-discontinue today after talking with hematology.   Resp cx with Candida dublisiensis-pulmonology is started him on Diflucan initially and then later ID stopped that thinking a test just a colonization. Fungitell was negative. Patient slowly improving clinically but repeat chest x-ray today was without any significant improvement but remained stable. Per pulmonology most likely will need a thoracic surgery-they will talk with thoracic surgeon  at [facility].  6/22- bronch and CTS on hold since doing better per CCM. But plan to notify CTS of the case.    Assessment & Plan:  Acute on chronic respiratory failure with hypoxia.  Improved, currently seems to be around his baseline. At baseline he is on 2 L.  6/22 on 3-5L [place] 02 Continue IS and Flutter valve Pccm following [place] 02 as tolerated  Necrotizing pneumonia causing sepsis.  MRSA positive and Candida positive. Presented with tachycardia, tachypnea, leukopenia, and blood pressure less than 100. Initially received vancomycin, cefepime and Flagyl later transitioned to linezolid by ID Pulmonology is recommending bronchoscopy, multiple labs including Fungitell is pending, concern of aspergilloma, might need bronchoscopy or a surgery.  Resp cx with Candida dublisiensis.  Fungitell was negative -Diflucan was discontinued by ID thinking that it might be just colonization. -Continue with linezolid -Repeat chest x-ray today was without any significant changes, stable findings. 6/22-per ID not much change in one week with oral linezolid, rec. CTS evaluation. Per PCCM will let CTS be aware   Neutropenia.  Noted during last hospitalization.  He was started on Granix for 3 doses by hematology, received 2 doses which resulted in significant increase in leukocytosis and neutrophils, Granix was discontinued after talking with hematology. Leukocytosis and neutrophil with significant decrease i,, today WBC of 2.7 and neutrophil of 0.6. -Hematology ordered just 1 dose of Granix 6/22-wbc has improved to 10.6 s/p Granix given on 6/21.  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  Resolved -Replete potassium as needed and monitor  Shakiness.  There was some witnessed shakiness, neurology was consulted for concern of seizure-like activity.  No shakiness  or any other seizure noted.  EEG normal. Neurology signed off and they are recommending  outpatient neurosurgery evaluation for some spinal stenosis, no emergent need.  If patient developed any new focal weakness or incontinence then they can be reconsulted and at that point he will need emergent neurosurgery evaluation. -Continue to monitor  Chronic diastolic CHF Euvolemic  Intussusception This is an apparently incidental finding, no evidence of small bowel obstruction, belly exam benign - Advance diet as tolerated -Stop PPI   Anemia of chronic disease H/H remains stable. Continue to monitor  Hyponatremia.  Resolved.   Smoking Cessation recommended, modalities discussed  Severe protein calorie malnutrition As evidenced by severe loss of subcutaneous muscle mass and fat, chronic infection, smoking -Consutl dietitian  Disposition: Status is: Inpatient  Remains inpatient appropriate because: Dispo: The patient is from: Home              Anticipated d/c is to: SNF              Patient currently is not medically stable to d/c.   Difficult to place patient No              Level of care: Med-Surg      DVT prophylaxis:   SCDs.  Code Status: Full code Family Communication: Discussed with patient.  Subjective: Denies cp. Unsure if sob. Asking if he is going home.   Objective: Vitals:   09/25/20 1927 09/25/20 2148 09/26/20 0327 09/26/20 0808  BP: 135/72   105/69  Pulse: (!) 105  90 83  Resp: _0 Temp: 97.6 F (36.4 C)  97.9 F (36.6 C) 97.8 F (36.6 C)  TempSrc: Oral     SpO2: 92% 93% 93% 96%  Weight:      Height:        Intake/Output Summary (Last 24 hours) at 09/26/2020 0813 Last data filed at 09/26/2020 0230 Gross per 24 hour  Intake --  Output 1100 ml  Net -1100 ml   Filed Weights   09/22/20 0500 09/23/20 0512 09/25/20 0434  Weight: 59.4 kg 59.1 kg 57.5 kg    Examination: Nad, calm Decrease air exchange. No wheezing Regular s1/s2  Soft benign +Bs No edema Awake and alert, grossly intact Grossly intact  Data Reviewed: I have  personally reviewed following labs and imaging studies:  CBC: Recent Labs  Lab 09/22/20 0518 09/23/20 0528 09/24/20 0702 09/25/20 0416 09/26/20 0425  WBC 42.9* 17.2* 4.1 2.7* 10.6*  NEUTROABS 37.0* 14.0* 1.6* 0.6* 5.7  HGB 10.9* 11.4* 11.5* 10.8* 10.5*  HCT 33.8* 36.5* 36.9* 35.4* 33.2*  MCV 86.9 89.7 87.6 89.2 87.4  PLT 197 200 227 221 825   Basic Metabolic Panel: Recent Labs  Lab 09/20/20 0443 09/23/20 0528 09/24/20 0702 09/25/20 0416  NA 138 134* 135  --   K 3.9 4.5 5.0  --   CL 101 93* 94*  --   CO2 31 38* 35*  --   GLUCOSE 132* 117* 123*  --   BUN 14 16 23*  --   CREATININE 0.34* 0.46* 0.56*  --   CALCIUM 8.2* 8.3* 8.3*  --   MG 2.0  --   --  2.2   GFR: Estimated Creatinine Clearance: 80.9 mL/min (A) (by C-G formula based on SCr of 0.56 mg/dL (L)). Liver Function Tests: No results for input(s): AST, ALT, ALKPHOS, BILITOT, PROT, ALBUMIN in the last 168 hours.  No results for input(s): LIPASE, AMYLASE in the last 168 hours.  No results for input(s): AMMONIA in the last 168 hours. Coagulation Profile: No results for input(s): INR, PROTIME in the last 168 hours.  Cardiac Enzymes: No results for input(s): CKTOTAL, CKMB, CKMBINDEX, TROPONINI in the last 168 hours. BNP (last 3 results) No results for input(s): PROBNP in the last 8760 hours. HbA1C: No results for input(s): HGBA1C in the last 72 hours. CBG: No results for input(s): GLUCAP in the last 168 hours. Lipid Profile: No results for input(s): CHOL, HDL, LDLCALC, TRIG, CHOLHDL, LDLDIRECT in the last 72 hours. Thyroid Function Tests: No results for input(s): TSH, T4TOTAL, FREET4, T3FREE, THYROIDAB in the last 72 hours. Anemia Panel: No results for input(s): VITAMINB12, FOLATE, FERRITIN, TIBC, IRON, RETICCTPCT in the last 72 hours. Urine analysis:    Component Value Date/Time   COLORURINE AMBER (A) 08/22/2020 1253   APPEARANCEUR HAZY (A) 08/22/2020 1253   LABSPEC 1.024 08/22/2020 1253   PHURINE 5.0  08/22/2020 [place] 08/22/2020 1253   HGBUR NEGATIVE 08/22/2020 [place] 08/22/2020 1253   KETONESUR NEGATIVE 08/22/2020 1253   PROTEINUR NEGATIVE 08/22/2020 1253   UROBILINOGEN 0.2 10/21/2012 1737   NITRITE NEGATIVE 08/22/2020 1253   LEUKOCYTESUR NEGATIVE 08/22/2020 1253   Sepsis Labs: _0 (procalcitonin:4,lacticacidven:4)  ) Recent Results (from the past 240 hour(s))  Blood culture (routine x 2)     Status: None   Collection Time: 09/17/20  1:58 PM   Specimen: BLOOD  Result Value Ref Range Status   Specimen Description BLOOD BLOOD LEFT FOREARM  Final   Special Requests   Final    BOTTLES DRAWN AEROBIC AND ANAEROBIC Blood Culture adequate volume   Culture   Final    NO GROWTH 5 DAYS Performed at [facility], [address] [place], [place] [address]    Report Status 09/22/2020 FINAL  Final  Blood culture (routine x 2)     Status: None   Collection Time: 09/17/20  1:59 PM   Specimen: BLOOD  Result Value Ref Range Status   Specimen Description BLOOD BLOOD RIGHT FOREARM  Final   Special Requests   Final    BOTTLES DRAWN AEROBIC AND ANAEROBIC Blood Culture results may not be optimal due to an excessive volume of blood received in culture bottles   Culture   Final    NO GROWTH 5 DAYS Performed at [facility], [address] [place], [place] [address]    Report Status 09/22/2020 FINAL  Final  Resp Panel by RT-PCR (Flu A&B, Covid) Nasopharyngeal Swab     Status: None   Collection Time: 09/17/20  2:14 PM   Specimen: Nasopharyngeal Swab; Nasopharyngeal(NP) swabs in vial transport medium  Result Value Ref Range Status   SARS Coronavirus 2 by RT PCR NEGATIVE NEGATIVE Final    Comment: (NOTE) SARS-CoV-2 target nucleic acids are NOT DETECTED.  The SARS-CoV-2 RNA is generally detectable in upper respiratory specimens during the acute phase of infection. The lowest concentration of SARS-CoV-2 viral copies this assay  can detect is 138 copies/mL. A negative result does not preclude SARS-Cov-2 infection and should not be used as the sole basis for treatment or other patient management decisions. A negative result may occur with  improper specimen collection/handling, submission of specimen other than nasopharyngeal swab, presence of viral mutation(s) within the areas targeted by this assay, and inadequate number of viral copies(<138 copies/mL). A negative result must be combined with clinical observations, patient history, and epidemiological information. The expected result is Negative.  Fact Sheet  for Patients:  EntrepreneurPulse.com.au  Fact Sheet for Healthcare Providers:  IncredibleEmployment.be  This test is no t yet approved or cleared by the [place] FDA and  has been authorized for detection and/or diagnosis of SARS-CoV-2 by FDA under an Emergency Use Authorization (EUA). This EUA will remain  in effect (meaning this test can be used) for the duration of the COVID-19 declaration under Section 564(b)(1) of the Act, 21 U.S.C.section 360bbb-3(b)(1), unless the authorization is terminated  or revoked sooner.       Influenza A by PCR NEGATIVE NEGATIVE Final   Influenza B by PCR NEGATIVE NEGATIVE Final    Comment: (NOTE) The Xpert Xpress SARS-CoV-2/FLU/RSV plus assay is intended as an aid in the diagnosis of influenza from Nasopharyngeal swab specimens and should not be used as a sole basis for treatment. Nasal washings and aspirates are unacceptable for Xpert Xpress SARS-CoV-2/FLU/RSV testing.  Fact Sheet for Patients: EntrepreneurPulse.com.au  Fact Sheet for Healthcare Providers: IncredibleEmployment.be  This test is not yet approved or cleared by the [place] FDA and has been authorized for detection and/or diagnosis of SARS-CoV-2 by FDA under an Emergency Use Authorization (EUA). This EUA will  remain in effect (meaning this test can be used) for the duration of the COVID-19 declaration under Section 564(b)(1) of the Act, 21 U.S.C. section 360bbb-3(b)(1), unless the authorization is terminated or revoked.  Performed at [facility], [address] [place], [place] [address]   MRSA PCR Screening     Status: Abnormal   Collection Time: 09/18/20  8:36 AM   Specimen: Nasopharyngeal  Result Value Ref Range Status   MRSA by PCR POSITIVE (A) NEGATIVE Final    Comment:        The GeneXpert MRSA Assay (FDA approved for NASAL specimens only), is one component of a comprehensive MRSA colonization surveillance program. It is not intended to diagnose MRSA infection nor to guide or monitor treatment for MRSA infections. RESULT CALLED TO, READ BACK BY AND VERIFIED WITH: Leota Jacobsen 1029 09/18/20 GM Performed at [facility], [address] [place], [place] [address]   Aspergillus Ag, BAL/Serum     Status: None   Collection Time: 09/19/20  5:05 AM   Specimen: Vein; Blood  Result Value Ref Range Status   Aspergillus Ag, BAL/Serum 0.02 0.00 - 0.49 Index Final    Comment: (NOTE) Performed At: [facility] [place], [place] [id number] Rush Farmer MD GB:[id number]   Expectorated Sputum Assessment w Gram Stain, Rflx to Resp Cult     Status: None   Collection Time: 09/20/20  5:33 AM   Specimen: Expectorated Sputum  Result Value Ref Range Status   Specimen Description EXPECTORATED SPUTUM  Final   Special Requests NONE  Final   Sputum evaluation   Final    THIS SPECIMEN IS ACCEPTABLE FOR SPUTUM CULTURE Performed at [facility], [address] [place], [place] [address]    Report Status 09/20/2020 FINAL  Final  Culture, Respiratory w Gram Stain     Status: None   Collection Time: 09/20/20  5:33 AM  Result Value Ref Range Status   Specimen Description   Final    EXPECTORATED SPUTUM Performed at [facility], [address], [place], [place] [address]    Special Requests   Final    NONE Reflexed from [id number] Performed at [facility], [address] [place], [place] [address]    Gram Stain   Final  ABUNDANT WBC PRESENT,BOTH PMN AND MONONUCLEAR FEW GRAM POSITIVE COCCI IN PAIRS IN CLUSTERS Performed at [facility], [place] [address] [place], [place] [address]    Culture   Final    ABUNDANT METHICILLIN RESISTANT STAPHYLOCOCCUS AUREUS   Report Status 09/23/2020 FINAL  Final   Organism ID, Bacteria METHICILLIN RESISTANT STAPHYLOCOCCUS AUREUS  Final      Susceptibility   Methicillin resistant staphylococcus aureus - MIC*    CIPROFLOXACIN <=0.5 SENSITIVE Sensitive     ERYTHROMYCIN >=8 RESISTANT Resistant     GENTAMICIN <=0.5 SENSITIVE Sensitive     OXACILLIN >=4 RESISTANT Resistant     TETRACYCLINE <=1 SENSITIVE Sensitive     VANCOMYCIN <=0.5 SENSITIVE Sensitive     TRIMETH/SULFA <=10 SENSITIVE Sensitive     CLINDAMYCIN <=0.25 SENSITIVE Sensitive     RIFAMPIN <=0.5 SENSITIVE Sensitive     Inducible Clindamycin NEGATIVE Sensitive     * ABUNDANT METHICILLIN RESISTANT STAPHYLOCOCCUS AUREUS  Acid Fast Smear (AFB)     Status: None   Collection Time: 09/20/20  5:33 AM   Specimen: Sputum  Result Value Ref Range Status   AFB Specimen Processing Concentration  Final   Acid Fast Smear Negative  Final    Comment: (NOTE) Performed At: [facility] [address] [place], [place] [id number] Rush Farmer MD [id number]    Source (AFB) EXPECTORATED SPUTUM  Final    Comment: Performed at [facility], [address] [place], [place] [address]  Anaerobic culture w Gram Stain     Status: None   Collection Time: 09/20/20  5:33 AM   Specimen: Expectorated Sputum  Result Value Ref Range Status   Specimen Description   Final    EXPECTORATED SPUTUM Performed at [facility], [address] [place], [place] [address]    Special Requests   Final     Normal Performed at [facility], [address] [place], [place] [address]    Gram Stain   Final    RARE WBC PRESENT,BOTH PMN AND MONONUCLEAR RARE GRAM POSITIVE COCCI IN PAIRS IN CLUSTERS    Culture   Final    RARE PREVOTELLA NANCEIENSIS BETA LACTAMASE POSITIVE Performed at [facility], [place] [address], [place], [place] [address]    Report Status 09/24/2020 FINAL  Final  Culture, fungus without smear     Status: Abnormal (Preliminary result)   Collection Time: 09/20/20  5:33 AM   Specimen: Expectorated Sputum  Result Value Ref Range Status   Specimen Description   Final    EXPECTORATED SPUTUM Performed at [facility], [address] [place], [place] [address]    Special Requests   Final    Normal Performed at [facility], [address], [place], [place] [address]    Culture (A)  Final    CANDIDA DUBLINIENSIS CONTINUING TO HOLD Performed at [facility], [place] [address], [place], [place] [address]    Report Status PENDING  Incomplete  Expectorated Sputum Assessment w Gram Stain, Rflx to Resp Cult     Status: None   Collection Time: 09/20/20  9:13 AM   Specimen: Sputum  Result Value Ref Range Status   Specimen Description SPUTUM  Final   Special Requests Normal  Final   Sputum evaluation   Final    THIS SPECIMEN IS ACCEPTABLE FOR SPUTUM CULTURE Performed at [facility], [address], [place], [place] [address]    Report Status 09/20/2020 FINAL  Final  Culture, Respiratory w Gram Stain     Status: None   Collection Time: 09/20/20  9:13 AM   Specimen: SPU  Result Value Ref Range Status   Specimen Description   Final    SPUTUM Performed at [facility], [address] [place], [place] [address]    Special Requests   Final    Normal Reflexed from [id number] Performed at [place] [place], [place] [address]    Gram Stain   Final    RARE WBC PRESENT,BOTH PMN AND  MONONUCLEAR RARE GRAM POSITIVE COCCI IN PAIRS IN CLUSTERS    Culture   Final    FEW STAPHYLOCOCCUS AUREUS SUSCEPTIBILITIES PERFORMED ON PREVIOUS CULTURE WITHIN THE LAST 5 DAYS. Performed at [facility], [place] [address], [place], [place] [address]    Report Status 09/23/2020 FINAL  Final         Radiology Studies: DG Chest 2 View  Result Date: 09/25/2020 CLINICAL DATA:  Cough.  Follow-up treatment. EXAM: CHEST - 2 VIEW COMPARISON:  CT 09/17/2020.  Chest x-ray 09/17/2020. FINDINGS: Mediastinum and hilar structures appears stable. Prominent cavitary infiltrate/mass is again noted in the anterior medial aspect of the right lung. Slight improvement from prior exams may be present. Mild adjacent infiltrate also again noted. Continued follow-up exam suggested to demonstrate complete resolution. No scratched it tiny right pleural effusion cannot be excluded. No pneumothorax. Old right rib fractures again noted. Degenerative change thoracic spine. IMPRESSION: Prominent cavitary infiltrate/mass is again noted in the anteromedial aspect of the right lung. Slight improvement from prior exam may be present. Mild adjacent infiltrate also again noted. Continued follow-up exam suggested to demonstrate complete resolution. Electronically Signed   By: Marcello Moores  Register   On: 09/25/2020 11:48      Scheduled Meds:  Chlorhexidine Gluconate Cloth  6 each Topical Q0600   feeding supplement  237 mL Oral TID BM   guaiFENesin  600 mg Oral BID   linezolid  600 mg Oral Q12H   metroNIDAZOLE  500 mg Oral BID   multivitamin with minerals  1 tablet Oral Daily   mupirocin ointment   Nasal BID   nicotine  21 mg Transdermal Daily   predniSONE  10 mg Oral BID WC   Continuous Infusions:      LOS: 9 days    Time spent: 35 minutes with >50 % on coc   Nolberto Hanlon, MD [facility] 09/26/2020, 8:13 AM     Please page though [place] or Epic secure chat:  For [employer], Adult nurse

## 2020-09-26 NOTE — Progress Notes (Signed)
Daily Progress Note   Patient Name: Marcus [name]       Date: 09/26/2020 DOB: 1960/08/29  Age: 60 y.o. MRN#: [id number] Attending Physician: Nolberto Hanlon, MD Primary Care Physician: [name], FNP Admit Date: 09/17/2020  Reason for Consultation/Follow-up: Establishing goals of care  Subjective: Patient is resting in bed with brother at bedside. He states he is waiting to find out the plans moving forward. He discusses he is open to whatever is offered. No complaints at this time.   Length of Stay: 9  Current Medications: Scheduled Meds:  . Chlorhexidine Gluconate Cloth  6 each Topical Q0600  . feeding supplement  237 mL Oral TID BM  . guaiFENesin  600 mg Oral BID  . linezolid  600 mg Oral Q12H  . metroNIDAZOLE  500 mg Oral BID  . multivitamin with minerals  1 tablet Oral Daily  . mupirocin ointment   Nasal BID  . nicotine  21 mg Transdermal Daily  . predniSONE  10 mg Oral BID WC    Continuous Infusions:   PRN Meds: acetaminophen **OR** acetaminophen, alum & mag hydroxide-simeth, benzonatate, chlorpheniramine-HYDROcodone, ipratropium-albuterol  Physical Exam Pulmonary:     Effort: Pulmonary effort is normal.  Neurological:     Mental Status: He is alert.            Vital Signs: BP 105/69 (BP Location: Left Arm)   Pulse 83   Temp 97.8 F (36.6 C)   Resp 18   Ht 5' 5.98" (1.676 m)   Wt 57.5 kg   SpO2 96%   BMI 20.47 kg/m  SpO2: SpO2: 96 % O2 Device: O2 Device: Nasal Cannula O2 Flow Rate: O2 Flow Rate (L/min): 5 L/min  Intake/output summary:  Intake/Output Summary (Last 24 hours) at 09/26/2020 0956 Last data filed at 09/26/2020 0700 Gross per 24 hour  Intake --  Output 1300 ml  Net -1300 ml   LBM: Last BM Date: 09/25/20 Baseline Weight: Weight: 63.7 kg Most  recent weight: Weight: 57.5 kg          Patient Active Problem List   Diagnosis Date Noted  . Protein-calorie malnutrition, severe 09/19/2020  . Lung abscess ([place]) 09/18/2020  . Respiratory failure with hypoxia ([place]) 09/17/2020  . Hemoptysis 09/17/2020  . Acquired neutropenia ([place]) 08/28/2020  . Hypothermia 08/24/2020  .  Cavitary pneumonia 08/22/2020  . Acute on chronic respiratory failure with hypoxia ([place]) 08/22/2020  . Hyponatremia 08/04/2020  . Generalized weakness 08/04/2020  . Dehydration 08/03/2020  . Septic shock ([place]) 06/29/2020  . CAP (community acquired pneumonia) 06/29/2020  . COPD exacerbation ([place]) 06/11/2020  . Chest pain 06/11/2020  . Cellulitis 06/11/2020  . Chronic diastolic CHF (congestive heart failure) ([place]) 06/11/2020  . Sepsis due to undetermined organism ([place]) 01/20/2020  . Acute hypoxemic respiratory failure ([place]) 01/20/2020  . Hypokalemia 01/20/2020  . Perforated prepyloric ulcer 01/18/2020 01/18/2020  . Pneumoperitoneum 01/17/2020  . COPD with acute exacerbation ([place]) 08/18/2019  . 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 Anemia 11/14/2015  . Seropositive rheumatoid arthritis ([place])   . Chronic obstructive pulmonary disease ([place])   . Moderate protein-calorie malnutrition ([place]) 07/24/2015  . Pancytopenia ([place]) 07/23/2015  . 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[place]) 06/06/2013  . Leukopenia 10/21/2012  . 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 Abnormal EKG 10/21/2012  . Joint pain 10/21/2012  . Splenomegaly 10/21/2012    Palliative Care Assessment & Plan   Recommendations/Plan: Full code/full scope.     Code Status:    Code Status Orders  (From admission, onward)           Start     Ordered   09/17/20 1901  Full code  Continuous        09/17/20 1907           Code Status History     Date Active Date Inactive Code Status Order ID Comments User Context   08/22/2020 1631 08/30/2020 1956 Full Code [id number]  Sharen Hones, MD ED   08/04/2020 0448 08/04/2020 2059  Full Code [id number]  [place], [place], DO Inpatient   06/29/2020 2214 07/01/2020 1603 Full Code [id number]  Truett Mainland, DO Inpatient   06/11/2020 0059 06/12/2020 1852 Full Code [id number]  Toy Baker, MD ED   01/18/2020 0239 01/24/2020 2303 Full Code [id number]  Deland Pretty, MD Inpatient   08/18/2019 2329 08/20/2019 1918 Full Code [id number]  Etta Quill, DO ED   11/14/2015 2123 11/16/2015 2204 Full Code [id number]  Phillips Grout, MD Inpatient   07/23/2015 1923 07/28/2015 1808 Full Code [id number]  Gladstone Lighter, MD Inpatient       Thank you for allowing the Palliative Medicine Team to assist in the care of this patient.       Total Time 15 min Prolonged Time Billed  no       Greater than 50%  of this time was spent counseling and coordinating care related to the above assessment and plan.  Asencion Gowda, NP  Please contact Palliative Medicine Team phone at [phone] for questions and concerns.

## 2020-09-26 NOTE — TOC Progression Note (Signed)
Transition of Care [facility]) - Progression Note    Patient Details  Name: KEYSHUN ELPERS MRN: [id number] Date of Birth: [birth year]  Transition [facility]) CM/SW [place], RN Phone Number: 09/26/2020, 10:25 AM  Clinical Narrative:   Patient would like to return home instead of going to SNF, states he is almost to his baseline and does not feel he will need home health.  TOC contact information given, TOC to follow to discharge for needs.    Expected Discharge Plan: Home/Self Care Barriers to Discharge: Continued Medical Work up  Expected Discharge Plan and Services Expected Discharge Plan: Home/Self Care   Discharge Planning Services: CM Consult Post Acute Care Choice: NA Living arrangements for the past 2 months: Single Family Home                                       Social Determinants of Health (SDOH) Interventions    Readmission Risk Interventions Readmission Risk Prevention Plan 09/20/2020 08/25/2020  Transportation Screening Complete Complete  PCP or Specialist Appt within 3-5 Days - Complete  HRI or [place] - Complete  Social Work Consult for [place] Planning/Counseling - Complete  Palliative Care Screening - Complete  Medication Review Press photographer) Complete Complete  PCP or Specialist appointment within 3-5 days of discharge Complete -  [place] or Home Care Consult Complete -  Palliative Care Screening Not Applicable -  [place] Not Applicable -  Some recent data might be hidden

## 2020-09-26 NOTE — Progress Notes (Signed)
Pulmonary Medicine          HISTORY OF PRESENT ILLNESS   60 yo M w/ hx of COPD, Feltys syndrome (enlarged spleen, low WBC count associated with RA), epigastric hernia and rheumatoid arthritis.  Patient had admission 01/2020 to [facility] with findings of pneumoperitoneum he was intubated during this time, had procedure done with graham patch incision hernia repair s/p exlap. He had MRSA pneumonia 08/2020, he has been on antibiotics with doxycycline on outpatient. He came in to ER with complaints of hemoptysis.  PCCM consultation for additional evaluation and management. Patient reports having visual symptoms and seizure like activity few weeks ago. NEURO w/u on going.    09/19/20- patient seems slightly improved, his microbiology is still in process. Currently on empiric therapy.  MRSA is +.  Hemoptysis has slowed down.   09/20/20- MRSA + pneumonia/abscess, now on linozolid, bronch to r/o involvement (aspergillosis.vs malignancy etc)t, radiography has failed prior appropriate therapy.    09/22/20- patient is still hemoptysizing.  He is on room air and feels weak. No changes to medical plan. Resp cx with Candida dublisiensis have initiated duflucan IV, will ask pharm/ID team to reivew meds and narrow   09/23/20- patient is worse with increased O2 requirement, we reviewed CT and CXR together.  Broadening of regimen with vancomycin and zosyn.   09/24/20- patient is improved, he still coughs with hemoptysis.  Fungitell is negative but resp culture is + for fungus and candida.  MRSA +   09/25/20-  still spitting up blood, sputum positive Candida dublisiensis. cxr today some possible improvement  09/26/20 - tsp of blood per sputum thus far. Feeling stronger   PAST MEDICAL HISTORY   Past Medical History:  Diagnosis Date  . COPD (chronic obstructive pulmonary disease) ([place])   . Felty syndrome ([place])   . Hernia, epigastric   . Pancytopenia ([place])   . Seropositive rheumatoid arthritis  ([place])   . Tobacco use disorder      SURGICAL HISTORY   Past Surgical History:  Procedure Laterality Date  . INCISIONAL HERNIA REPAIR  01/17/2020   Procedure: HERNIA REPAIR INCISIONAL AND Silvestre Gunner;  Surgeon: Kinsinger, Arta Bruce, MD;  Location: [facility];  Service: General;;  . LAPAROTOMY N/A 01/17/2020   Procedure: EXPLORATORY LAPAROTOMY;  Surgeon: Kieth Brightly Arta Bruce, MD;  Location: [facility];  Service: General;  Laterality: N/A;     FAMILY HISTORY   Family History  Problem Relation Age of Onset  . CAD Brother   . COPD Brother      SOCIAL HISTORY   Social History   Tobacco Use  . Smoking status: Every Day    Packs/day: 1.00    Pack years: 0.00    Types: Cigarettes  . Smokeless tobacco: Never  Substance Use Topics  . Alcohol use: No    Alcohol/week: 0.0 standard drinks  . Drug use: Yes    Types: Marijuana     MEDICATIONS    Home Medication:    Current Medication:  Current Facility-Administered Medications:  .  acetaminophen (TYLENOL) tablet 650 mg, 650 mg, Oral, Q6H PRN, 650 mg at 09/21/20 1925 **OR** acetaminophen (TYLENOL) suppository 650 mg, 650 mg, Rectal, Q6H PRN, Para Skeans, MD .  alum & mag hydroxide-simeth (MAALOX/MYLANTA) 200-200-20 MG/5ML suspension 30 mL, 30 mL, Oral, Q4H PRN, Lorella Nimrod, MD, 30 mL at 09/25/20 0028 .  benzonatate (TESSALON) capsule 200 mg, 200 mg, Oral, BID PRN, Athena Masse, MD, 200 mg at 09/25/20 2011 .  Chlorhexidine Gluconate Cloth 2 % PADS 6 each, 6 each, Topical, Q0600, Lorella Nimrod, MD, 6 each at 09/25/20 0522 .  chlorpheniramine-HYDROcodone (TUSSIONEX) 10-8 MG/5ML suspension 5 mL, 5 mL, Oral, Q12H PRN, Athena Masse, MD, 5 mL at 09/25/20 2011 .  feeding supplement (ENSURE ENLIVE / ENSURE PLUS) liquid 237 mL, 237 mL, Oral, TID BM, Lorella Nimrod, MD, 237 mL at 09/26/20 1023 .  guaiFENesin (MUCINEX) 12 hr tablet 600 mg, 600 mg, Oral, BID, Mansy, Jan A, MD, 600 mg at 09/26/20 1022 .  ipratropium-albuterol (DUONEB)  0.5-2.5 (3) MG/3ML nebulizer solution 3 mL, 3 mL, Nebulization, Q4H PRN, Athena Masse, MD, 3 mL at 09/25/20 2146 .  linezolid (ZYVOX) tablet 600 mg, 600 mg, Oral, Q12H, Ravishankar, Jayashree, MD, 600 mg at 09/26/20 1036 .  metroNIDAZOLE (FLAGYL) tablet 500 mg, 500 mg, Oral, BID, Ravishankar, Jayashree, MD, 500 mg at 09/26/20 1023 .  multivitamin with minerals tablet 1 tablet, 1 tablet, Oral, Daily, Lorella Nimrod, MD, 1 tablet at 09/26/20 1021 .  mupirocin ointment (BACTROBAN) 2 %, , Nasal, BID, Lorella Nimrod, MD, Given at 09/26/20 1035 .  nicotine (NICODERM CQ - dosed in mg/24 hours) patch 21 mg, 21 mg, Transdermal, Daily, Florina Ou V, MD, 21 mg at 09/26/20 1022 .  predniSONE (DELTASONE) tablet 10 mg, 10 mg, Oral, BID WC, Para Skeans, MD, 10 mg at 09/26/20 1022    ALLERGIES   Levaquin [levofloxacin in d5w] and Methotrexate     REVIEW OF SYSTEMS    Review of Systems:  Gen:  Denies  fever, sweats, chills weigh loss  HEENT: Denies blurred vision, double vision, ear pain, eye pain, hearing loss, nose bleeds, sore throat Cardiac:  No dizziness, chest pain or heaviness, chest tightness,edema Resp:   Denies cough or sputum porduction, shortness of breath,wheezing, hemoptysis,  Gi: Denies swallowing difficulty, stomach pain, nausea or vomiting, diarrhea, constipation, bowel incontinence Gu:  Denies bladder incontinence, burning urine Ext:   Denies Joint pain, stiffness or swelling Skin: Denies  skin rash, easy bruising or bleeding or hives Endoc:  Denies polyuria, polydipsia , polyphagia or weight change Psych:   Denies depression, insomnia or hallucinations   Other:  All other systems negative   VS: BP 109/68 (BP Location: Right Arm)   Pulse 89   Temp 97.8 F (36.6 C)   Resp 18   Ht 5' 5.98" (1.676 m)   Wt 57.5 kg   SpO2 97%   BMI 20.47 kg/m      PHYSICAL EXAM    GENERAL:More talkative HEAD: Normocephalic, atraumatic.  EYES: Pupils equal, round, reactive to  light. Extraocular muscles intact. No scleral icterus.  MOUTH: Moist mucosal membrane. Dentition intact. No abscess noted.  EAR, NOSE, THROAT: Clear without exudates. No external lesions.  NECK: Supple. No thyromegaly. No nodules. No JVD.  PULMONARY: Diffuse coarse rhonchi right sided +wheezes CARDIOVASCULAR: S1 and S2. Regular rate and rhythm. No murmurs, rubs, or gallops. No edema. Pedal pulses 2+ bilaterally.  GASTROINTESTINAL: Soft, nontender, nondistended. No masses. Positive bowel sounds. No hepatosplenomegaly.  MUSCULOSKELETAL: No swelling, clubbing, or edema. Range of motion full in all extremities.  NEUROLOGIC: Cranial nerves II through XII are intact. No gross focal neurological deficits. Sensation intact. Reflexes intact.  SKIN: No ulceration, lesions, rashes, or cyanosis. Skin warm and dry. Turgor intact.  PSYCHIATRIC: Mood, affect within normal limits. The patient is awake, alert and oriented x 3. Insight, judgment intact.       IMAGING    DG Chest 2  View  Result Date: 09/25/2020 CLINICAL DATA:  Cough.  Follow-up treatment. EXAM: CHEST - 2 VIEW COMPARISON:  CT 09/17/2020.  Chest x-ray 09/17/2020. FINDINGS: Mediastinum and hilar structures appears stable. Prominent cavitary infiltrate/mass is again noted in the anterior medial aspect of the right lung. Slight improvement from prior exams may be present. Mild adjacent infiltrate also again noted. Continued follow-up exam suggested to demonstrate complete resolution. No scratched it tiny right pleural effusion cannot be excluded. No pneumothorax. Old right rib fractures again noted. Degenerative change thoracic spine. IMPRESSION: Prominent cavitary infiltrate/mass is again noted in the anteromedial aspect of the right lung. Slight improvement from prior exam may be present. Mild adjacent infiltrate also again noted. Continued follow-up exam suggested to demonstrate complete resolution. Electronically Signed   By: Marcello Moores  Register   On:  09/25/2020 11:48   DG Chest 2 View  Result Date: 09/06/2020 CLINICAL DATA:  Shortness of breath.  Cough. EXAM: CHEST - 2 VIEW COMPARISON:  Chest x-ray 08/28/2020.  CT 08/22/2020. FINDINGS: Mediastinum unremarkable. Heart size normal. Cavitary process in the right mid lung again noted. No change identified. No pleural effusion or pneumothorax. Old right rib fractures. IMPRESSION: Cavitary process in the right mid lung again noted. Cavitary process best identified by prior CT. Chest is unchanged from prior exam. Electronically Signed   By: Marcello Moores  Register   On: 09/06/2020 14:04   CT Angio Chest PE W and/or Wo Contrast  Result Date: 09/17/2020 CLINICAL DATA:  Hemoptysis, shortness of breath, abdominal distension EXAM: CT ANGIOGRAPHY CHEST CT ABDOMEN AND PELVIS WITH CONTRAST TECHNIQUE: Multidetector CT imaging of the chest was performed using the standard protocol during bolus administration of intravenous contrast. Multiplanar CT image reconstructions and MIPs were obtained to evaluate the vascular anatomy. Multidetector CT imaging of the abdomen and pelvis was performed using the standard protocol during bolus administration of intravenous contrast. CONTRAST:  64mL OMNIPAQUE IOHEXOL 350 MG/ML SOLN COMPARISON:  CT 06/20/2020, 06/22/2020 FINDINGS: CTA CHEST FINDINGS Cardiovascular: Satisfactory opacification of the pulmonary arteries to the segmental level. No evidence of pulmonary embolism. Thoracic aorta is normal in course and caliber. Atherosclerotic calcifications of the aorta and coronary arteries. Normal heart size. No pericardial effusion. Mediastinum/Nodes: Mildly prominent mediastinal lymph nodes are again noted, likely reactive. No axillary or hilar lymphadenopathy. Lungs/Pleura: Redemonstrated cavitary mass located within the inferomedial aspect of the right upper lobe measuring approximately 9.9 x 6.6 x 7.4 cm (previously measured approximately 7.8 x 4.5 x 5.5 cm on 08/22/2020. Increased surrounding  consolidation and ground-glass opacity within the adjacent right upper lobe and right middle lobe previously seen cavitary lesion within the medial aspect of the left upper lobe has resolved with small thin walled pleural cyst remaining (series 6, image 44). Scattered areas of ground-glass opacity within the bilateral lower lobes and upper lobes. Background of moderate emphysema. No pleural effusion or pneumothorax. Musculoskeletal: Remote right sixth and seventh rib fractures. No new or acute osseous findings. No chest wall abnormality. Review of the MIP images confirms the above findings. CT ABDOMEN and PELVIS FINDINGS Hepatobiliary: Cholelithiasis without evidence of pericholecystic inflammation by CT. Liver within normal limits. No biliary dilatation. Pancreas: Unremarkable. No pancreatic ductal dilatation or surrounding inflammatory changes. Spleen: Prior splenectomy. Adrenals/Urinary Tract: Unremarkable adrenal glands. Kidneys enhance symmetrically without focal lesion, stone, or hydronephrosis. Ureters are nondilated. Urinary bladder appears unremarkable. Stomach/Bowel: Stomach is within normal limits. Appendix appears normal (series 2, image 58). Incidental note of a small bowel-small bowel intussusception within the mid left abdomen (series 2,  image 38). No evidence of bowel wall thickening, distention, or inflammatory changes. Vascular/Lymphatic: Fusiform infrarenal abdominal aortic aneurysm measuring up to 3.1 cm, unchanged. Aortoiliac atherosclerosis. No abdominopelvic lymphadenopathy. Reproductive: Prostate is unremarkable. Other: No free fluid. No abdominopelvic fluid collection. No pneumoperitoneum. Fat containing midline supraumbilical hernia without evidence of complication. Musculoskeletal: No acute or significant osseous findings. Review of the MIP images confirms the above findings. IMPRESSION: 1. No evidence of acute pulmonary embolism. 2. Enlarging cavitary mass within the inferomedial aspect  of the right upper lobe measuring up to 9.9 cm (previously measured approximately 7.8 x 4.5 x 5.5 cm on 08/22/2020). Increased surrounding consolidation and ground-glass opacity within the adjacent right upper lobe and right middle lobe. Findings remain most compatible with cavitary/necrotizing pneumonia. 3. Scattered areas of ground-glass opacity within the bilateral lower lobes and upper lobes, concerning for multifocal infection. 4. Interval resolution of previously seen cavitary lesion within the medial aspect of the left upper lobe with small thin walled pleural cyst remaining. 5. Cholelithiasis without evidence of pericholecystic inflammation by CT. 6. Incidental note of a small bowel-small bowel intussusception within the mid left abdomen. No evidence of bowel obstruction. 7. Fat containing midline supraumbilical hernia without evidence of complication. 8. Stable infrarenal abdominal aortic aneurysm measuring up to 3.1 cm. Recommend follow-up ultrasound every 3 years. This recommendation follows ACR consensus guidelines: White Paper of the ACR Incidental Findings Committee II on Vascular Findings. J Am Coll Radiol 2013; 10:789-794. Aortic Atherosclerosis (ICD10-I70.0) and Emphysema (ICD10-J43.9). Electronically Signed   By: Davina Poke D.O.   On: 09/17/2020 16:38   MR BRAIN W WO CONTRAST  Result Date: 09/18/2020 CLINICAL DATA:  Brain mass or lesion. 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 Recent MRSA pneumonia. Patient reports visual symptoms and seizure-like activity a few weeks ago. EXAM: MRI HEAD WITHOUT AND WITH CONTRAST TECHNIQUE: Multiplanar, multiecho pulse sequences of the brain and surrounding structures were obtained without and with intravenous contrast. CONTRAST:  21mL GADAVIST GADOBUTROL 1 MMOL/ML IV SOLN COMPARISON:  No pertinent prior exams available for comparison. FINDINGS: Brain: Intermittently motion degraded  examination, limiting evaluation. Most notably, there is moderate/severe motion degradation of the sagittal T1 weighted sequence, moderate/severe motion degradation of the axial T2/FLAIR sequence, moderate motion degradation of the axial T1 weighted postcontrast sequence and moderate motion degradation of the coronal T1 weighted postcontrast sequence. Mild generalized cerebral and cerebellar atrophy. Advanced patchy and confluent T2/FLAIR hyperintensity within the cerebral white matter, nonspecific but most often secondary to chronic small vessel ischemia. Chronic lacunar infarct within the left lentiform nucleus. Small foci of SWI signal loss within the left basal ganglia and along the margin of the posterior right lateral ventricle, likely reflecting chronic microhemorrhages. Additional subcentimeter focus of SWI signal loss within the midline cerebellum, which may reflect an additional chronic microhemorrhage or cavernoma. There is no acute infarct. No evidence of intracranial mass. No extra-axial fluid collection. No midline shift. Within the limitations of motion degradation, no abnormal intracranial enhancement is identified. Vascular: Expected proximal arterial flow voids. Non dominant intracranial right vertebral artery. Skull and upper cervical spine: Within the limitations of motion degradation, no focal marrow lesion is identified. Sinuses/Orbits: Visualized orbits show no acute finding. 1.9 cm right maxillary sinus mucous retention cyst. Trace left maxillary sinus mucosal thickening. Other: Trace bilateral mastoid effusions. 2.8 cm ovoid lesion within the left suboccipital scalp demonstrating heterogeneous but predominantly T2 hyperintense signal, T1 hypointense signal, restricted diffusion and no convincing abnormal enhancement.  This is favored to reflect a sebaceous/epidermoid cyst. IMPRESSION: Motion degraded examination, as described and limiting evaluation. No evidence of acute intracranial  abnormality. Severe patchy and confluent T2/FLAIR hyperintensity within the cerebral white matter, nonspecific but most often secondary to chronic small vessel ischemia. Chronic left basal ganglia lacunar infarct. Subcentimeter focus of SWI signal loss within the midline cerebellum, which may reflect a chronic microhemorrhage or cavernoma. Mild generalized parenchymal atrophy. Incidentally noted 1.9 cm right maxillary sinus mucous retention cyst. Trace bilateral mastoid effusions. Electronically Signed   By: Kellie Simmering DO   On: 09/18/2020 15:59   MR CERVICAL SPINE W WO CONTRAST  Result Date: 09/19/2020 CLINICAL DATA:  Acute or progressive myelopathy. EXAM: MRI CERVICAL SPINE WITHOUT AND WITH CONTRAST TECHNIQUE: Multiplanar and multiecho pulse sequences of the cervical spine, to include the craniocervical junction and cervicothoracic junction, were obtained without and with intravenous contrast. CONTRAST:  68mL GADAVIST GADOBUTROL 1 MMOL/ML IV SOLN COMPARISON:  No previous cervical imaging. FINDINGS: Alignment: Straightening of the normal cervical lordosis. 1 mm degenerative anterolisthesis C7-T1. Vertebrae: No fracture or focal bone lesion. Cord: No primary cord lesion.  See below regarding stenosis. Posterior Fossa, vertebral arteries, paraspinal tissues: Large sebaceous cyst of the upper left neck. Disc levels: Foramen magnum is widely patent.  C1-2 and C2-3 are normal. C3-4: Endplate osteophytes and bulging of the disc. Canal stenosis with AP diameter in the midline 6 mm. Effacement of the subarachnoid space and slight indentation of the cord. Moderate bilateral foraminal narrowing. C4-5: Endplate osteophytes and protrusion of the disc more prominent towards the left. Effacement of the subarachnoid space and cord deformity, worse on the left. AP diameter of the canal 5.9 mm. Bilateral foraminal stenosis. C5-6: Spondylosis with endplate osteophytes and bulging of the disc. Canal narrowing with AP diameter in  the midline 8.3 mm. No compressive effect upon the cord. Bilateral foraminal narrowing. C6-7: Mild bulging of the disc. No canal stenosis. Foraminal narrowing on the right that could affect the exiting C7 nerve. C7-T1: Facet osteoarthritis with 1 mm of anterolisthesis. No stenosis of the canal or foramina. 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 AP diameter of the canal at C3-4 6 mm. AP diameter of the spinal canal at C4-5 5.9 mm. Effacement of the subarachnoid space with some indentation of the cord. No abnormal cord T2 signal however. Foraminal stenosis that could cause neural compression on either side at C3-4, either side at C4-5, either side at C5-6 and on the right C6-7. Electronically Signed   By: Nelson Chimes M.D.   On: 09/19/2020 18:01   MR THORACIC SPINE W WO CONTRAST  Result Date: 09/19/2020 CLINICAL DATA:  Acute or progressive myelopathy. EXAM: MRI THORACIC WITHOUT AND WITH CONTRAST TECHNIQUE: Multiplanar and multiecho pulse sequences of the thoracic spine were obtained without and with intravenous contrast. CONTRAST:  52mL GADAVIST GADOBUTROL 1 MMOL/ML IV SOLN COMPARISON:  Cervical study same day FINDINGS: Alignment:  Normal Vertebrae: No fracture or focal bone lesion. Cord: No cord compression or primary cord lesion. Spinal canal widely patent throughout the region. No evidence of arteriovenous malformation. Paraspinal and other soft tissues: Negative Disc levels: No significant disc pathology in the thoracic region. Wide patency of the canal and foramina. Ordinary mild facet osteoarthritis without edema or enhancement. No encroachment upon the neural spaces. IMPRESSION: Negative thoracic study. No cord compression or cord lesion. No significant degenerative changes. Electronically Signed   By: Nelson Chimes M.D.   On: 09/19/2020 18:03  CT ABDOMEN PELVIS W CONTRAST  Result Date: 09/17/2020 CLINICAL DATA:  Hemoptysis, shortness of breath, abdominal distension  EXAM: CT ANGIOGRAPHY CHEST CT ABDOMEN AND PELVIS WITH CONTRAST TECHNIQUE: Multidetector CT imaging of the chest was performed using the standard protocol during bolus administration of intravenous contrast. Multiplanar CT image reconstructions and MIPs were obtained to evaluate the vascular anatomy. Multidetector CT imaging of the abdomen and pelvis was performed using the standard protocol during bolus administration of intravenous contrast. CONTRAST:  11mL OMNIPAQUE IOHEXOL 350 MG/ML SOLN COMPARISON:  CT 06/20/2020, 06/22/2020 FINDINGS: CTA CHEST FINDINGS Cardiovascular: Satisfactory opacification of the pulmonary arteries to the segmental level. No evidence of pulmonary embolism. Thoracic aorta is normal in course and caliber. Atherosclerotic calcifications of the aorta and coronary arteries. Normal heart size. No pericardial effusion. Mediastinum/Nodes: Mildly prominent mediastinal lymph nodes are again noted, likely reactive. No axillary or hilar lymphadenopathy. Lungs/Pleura: Redemonstrated cavitary mass located within the inferomedial aspect of the right upper lobe measuring approximately 9.9 x 6.6 x 7.4 cm (previously measured approximately 7.8 x 4.5 x 5.5 cm on 08/22/2020. Increased surrounding consolidation and ground-glass opacity within the adjacent right upper lobe and right middle lobe previously seen cavitary lesion within the medial aspect of the left upper lobe has resolved with small thin walled pleural cyst remaining (series 6, image 44). Scattered areas of ground-glass opacity within the bilateral lower lobes and upper lobes. Background of moderate emphysema. No pleural effusion or pneumothorax. Musculoskeletal: Remote right sixth and seventh rib fractures. No new or acute osseous findings. No chest wall abnormality. Review of the MIP images confirms the above findings. CT ABDOMEN and PELVIS FINDINGS Hepatobiliary: Cholelithiasis without evidence of pericholecystic inflammation by CT. Liver  within normal limits. No biliary dilatation. Pancreas: Unremarkable. No pancreatic ductal dilatation or surrounding inflammatory changes. Spleen: Prior splenectomy. Adrenals/Urinary Tract: Unremarkable adrenal glands. Kidneys enhance symmetrically without focal lesion, stone, or hydronephrosis. Ureters are nondilated. Urinary bladder appears unremarkable. Stomach/Bowel: Stomach is within normal limits. Appendix appears normal (series 2, image 58). Incidental note of a small bowel-small bowel intussusception within the mid left abdomen (series 2, image 38). No evidence of bowel wall thickening, distention, or inflammatory changes. Vascular/Lymphatic: Fusiform infrarenal abdominal aortic aneurysm measuring up to 3.1 cm, unchanged. Aortoiliac atherosclerosis. No abdominopelvic lymphadenopathy. Reproductive: Prostate is unremarkable. Other: No free fluid. No abdominopelvic fluid collection. No pneumoperitoneum. Fat containing midline supraumbilical hernia without evidence of complication. Musculoskeletal: No acute or significant osseous findings. Review of the MIP images confirms the above findings. IMPRESSION: 1. No evidence of acute pulmonary embolism. 2. Enlarging cavitary mass within the inferomedial aspect of the right upper lobe measuring up to 9.9 cm (previously measured approximately 7.8 x 4.5 x 5.5 cm on 08/22/2020). Increased surrounding consolidation and ground-glass opacity within the adjacent right upper lobe and right middle lobe. Findings remain most compatible with cavitary/necrotizing pneumonia. 3. Scattered areas of ground-glass opacity within the bilateral lower lobes and upper lobes, concerning for multifocal infection. 4. Interval resolution of previously seen cavitary lesion within the medial aspect of the left upper lobe with small thin walled pleural cyst remaining. 5. Cholelithiasis without evidence of pericholecystic inflammation by CT. 6. Incidental note of a small bowel-small bowel  intussusception within the mid left abdomen. No evidence of bowel obstruction. 7. Fat containing midline supraumbilical hernia without evidence of complication. 8. Stable infrarenal abdominal aortic aneurysm measuring up to 3.1 cm. Recommend follow-up ultrasound every 3 years. This recommendation follows ACR consensus guidelines: White Paper of the ACR Incidental Findings Committee  II on Vascular Findings. J Am Coll Radiol 2013; 10:789-794. Aortic Atherosclerosis (ICD10-I70.0) and Emphysema (ICD10-J43.9). Electronically Signed   By: Davina Poke D.O.   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 There is increase in consolidation in this area compared to most recent study. Lungs elsewhere clear. Heart size and pulmonary vascular normal. No adenopathy appreciable. No bone lesions. IMPRESSION: Cavitary mass right middle lobe anteriorly with increase in surrounding consolidation compared to most recent study. The lungs elsewhere are clear. Heart size normal. No adenopathy appreciable by radiography. Electronically Signed   By: Lowella Grip III M.D.   On: 09/17/2020 13:54   DG Chest Port 1 View  Result Date: 08/28/2020 CLINICAL DATA:  Hypoxia. EXAM: PORTABLE CHEST 1 VIEW COMPARISON:  CT 08/22/2020.  Chest x-ray 08/22/2020. FINDINGS: Mediastinum hilar structures normal. Heart size normal. Cavitary process in the right mid lung and left upper lung best identified by prior CT. No new findings are noted. No pleural effusion or pneumothorax. Heart size stable. Degenerative change thoracic spine. Old right rib fractures present. IMPRESSION: Cavitary process in the right mid lung and left upper lung best identified by prior CT. Chest is  unchanged from prior exam. Electronically Signed   By: Marcello Moores  Register   On: 08/28/2020 08:25   DG Swallowing Func-Speech Pathology  Result Date: 09/06/2020 Please refer to "Notes" tab for Speech Pathology notes.  ECHOCARDIOGRAM COMPLETE  Result Date: 08/28/2020    ECHOCARDIOGRAM REPORT   Patient Name:   SLEVIN GUNBY [facility] Date of Exam: 08/28/2020 Medical Rec #:  [id number]    Height:       66.0 in Accession #:    [id number]   Weight:       133.6 lb Date of Birth:  1960-04-21     BSA:          1.685 m Patient Age:    36 years     BP:           120/74 mmHg Patient Gender: M            HR:           76 bpm. 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 COPD. Tobacco use disorder.  Sonographer:     Sherrie Sport RDCS (AE) Referring Phys:  [id number] Sharen Hones Diagnosing Phys: Nelva Bush MD  Sonographer Comments: Technically challenging study due to limited acoustic windows. Image acquisition challenging due to COPD. IMPRESSIONS  1. Left ventricular ejection fraction, by estimation, is 55 to 60%. The left ventricle has normal function. Left ventricular endocardial border not optimally defined to evaluate regional wall motion. Left ventricular diastolic parameters are consistent with Grade I diastolic dysfunction (impaired relaxation).  2. Right ventricular systolic function is normal. The right ventricular size is mildly enlarged. Mildly increased right ventricular wall thickness. There is normal pulmonary artery systolic pressure.  3. The mitral valve is degenerative. Trivial mitral valve regurgitation. No evidence of  mitral stenosis.  4. The aortic valve was not well visualized. Aortic valve regurgitation is not visualized. No aortic stenosis is present.  5. Pulmonic valve regurgitation not well assessed.  6. The inferior vena cava is normal  in size with greater than 50% respiratory variability, suggesting right atrial pressure of 3 mmHg. Conclusion(s)/Recommendation(s): Valves suboptimally imaged to exclude endocarditis. If clinical concern persists, transesophageal echocardiogram should be considered. FINDINGS  Left Ventricle: Left ventricular ejection fraction, by estimation, is 55 to 60%. The left ventricle has normal function. Left ventricular endocardial border not optimally defined to evaluate regional wall motion. The left ventricular internal cavity size was normal in size. There is no left ventricular hypertrophy. Left ventricular diastolic parameters are consistent with Grade I diastolic dysfunction (impaired relaxation). Right Ventricle: The right ventricular size is mildly enlarged. Mildly increased right ventricular wall thickness. Right ventricular systolic function is normal. There is normal pulmonary artery systolic pressure. The tricuspid regurgitant velocity is 1.72 m/s, and with an assumed right atrial pressure of 3 mmHg, the estimated right ventricular systolic pressure is 92.3 mmHg. Left Atrium: Left atrial size was normal in size. Right Atrium: Right atrial size was normal in size. Pericardium: There is no evidence of pericardial effusion. Mitral Valve: The mitral valve is degenerative in appearance. There is mild thickening of the mitral valve leaflet(s). There is mild calcification of the mitral valve leaflet(s). Mild mitral annular calcification. Trivial mitral valve regurgitation. No evidence of mitral valve stenosis. Tricuspid Valve: The tricuspid valve is grossly normal. Tricuspid valve regurgitation is trivial. Aortic Valve: The aortic valve was not well visualized. Aortic valve regurgitation is not visualized. No aortic stenosis is present. Aortic valve mean gradient measures 3.0 mmHg. Aortic valve peak gradient measures 4.8 mmHg. Aortic valve area, by VTI measures 2.04 cm. Pulmonic Valve: The pulmonic valve was not well  visualized. Pulmonic valve regurgitation not well assessed. Aorta: The aortic root is normal in size and structure. Pulmonary Artery: The pulmonary artery is not well seen. Venous: The inferior vena cava is normal in size with greater than 50% respiratory variability, suggesting right atrial pressure of 3 mmHg. IAS/Shunts: The interatrial septum was not well visualized.  LEFT VENTRICLE PLAX 2D LVIDd:         5.38 cm  Diastology LVIDs:         2.94 cm  LV e' medial:    6.09 cm/s LV PW:         1.02 cm  LV E/e' medial:  10.3 LV IVS:        0.73 cm  LV e' lateral:   11.40 cm/s LVOT diam:     2.10 cm  LV E/e' lateral: 5.5 LV SV:         45 LV SV Index:   27 LVOT Area:     3.46 cm  RIGHT VENTRICLE RV Basal diam:  4.23 cm RV S prime:     9.46 cm/s TAPSE (M-mode): 2.0 cm LEFT ATRIUM             Index       RIGHT ATRIUM           Index LA diam:        3.50 cm 2.08 cm/m  RA Area:     16.50 cm LA Vol (A2C):   30.0 ml 17.81 ml/m RA Volume:   44.10 ml  26.18 ml/m LA Vol (A4C):   39.3 ml 23.33 ml/m LA Biplane Vol: 35.6 ml 21.13 ml/m  AORTIC VALVE  PULMONIC VALVE AV Area (Vmax):    1.89 cm    PV Vmax:        0.88 m/s AV Area (Vmean):   2.10 cm    PV Peak grad:   3.1 mmHg AV Area (VTI):     2.04 cm    RVOT Peak grad: 3 mmHg AV Vmax:           110.00 cm/s AV Vmean:          74.000 cm/s AV VTI:            0.219 m AV Peak Grad:      4.8 mmHg AV Mean Grad:      3.0 mmHg LVOT Vmax:         60.10 cm/s LVOT Vmean:        44.800 cm/s LVOT VTI:          0.129 m LVOT/AV VTI ratio: 0.59  AORTA Ao Root diam: 3.60 cm MITRAL VALVE               TRICUSPID VALVE MV Area (PHT): 4.86 cm    TR Peak grad:   11.8 mmHg MV Decel Time: 156 msec    TR Vmax:        172.00 cm/s MV E velocity: 62.60 cm/s MV A velocity: 86.50 cm/s  SHUNTS MV E/A ratio:  0.72        Systemic VTI:  0.13 m                            Systemic Diam: 2.10 cm Nelva Bush MD Electronically signed by Nelva Bush MD Signature Date/Time:  08/28/2020/9:56:00 AM    Final       ASSESSMENT/PLAN   Presented in respiratory failure, non massive hemoptysis,  enlarging RUL density with crescent air sign. 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 Meds adjusted. On linezolid.The hemoptysis is from the infection (on going necrosis). His neutropenia improved on Granix.  Heme involved. Candida in sputum ? Oral Colonization, relatively immunocompromised. Aspergillosis studies negative, fungitell negative. His hemoptysis is much improved today. Looks stronger.  -continue present antibiotic regimen -incentive spirometry -Since he is clinically looking better and the cxr is improving, will hold on bronch  relative contraindication doing a bronch in some one with a potential abscess) and t-surg consult. However, will notify t-surg of the case  -anticipate d/c to Snf ? By Friday           Thank you for allowing me to participate in the care of this patient.   Patient/Family are satisfied with care plan and all questions have been answered.  This document was prepared using Dragon voice recognition software and may include unintentional dictation errors.     Wallene Huh, M.D.  Division of [place]

## 2020-09-27 ENCOUNTER — Other Ambulatory Visit (HOSPITAL_COMMUNITY): Payer: Self-pay

## 2020-09-27 DIAGNOSIS — A4902 Methicillin resistant Staphylococcus aureus infection, unspecified site: Secondary | ICD-10-CM

## 2020-09-27 DIAGNOSIS — J9621 Acute and chronic respiratory failure with hypoxia: Secondary | ICD-10-CM | POA: Diagnosis not present

## 2020-09-27 LAB — BASIC METABOLIC PANEL
Anion gap: 8 (ref 5–15)
BUN: 28 mg/dL — ABNORMAL HIGH (ref 6–20)
CO2: 34 mmol/L — ABNORMAL HIGH (ref 22–32)
Calcium: 8.4 mg/dL — ABNORMAL LOW (ref 8.9–10.3)
Chloride: 92 mmol/L — ABNORMAL LOW (ref 98–111)
Creatinine, Ser: 0.39 mg/dL — ABNORMAL LOW (ref 0.61–1.24)
GFR, Estimated: >60 mL/min (ref >60)
Glucose, Bld: 106 mg/dL — ABNORMAL HIGH (ref 70–99)
Potassium: 4.9 mmol/L (ref 3.5–5.1)
Sodium: 134 mmol/L — ABNORMAL LOW (ref 135–145)

## 2020-09-27 LAB — CBC WITH DIFFERENTIAL/PLATELET
Abs Immature Granulocytes: 0.55 10*3/uL — ABNORMAL HIGH (ref 0.00–0.07)
Basophils Absolute: 0.1 10*3/uL (ref 0.0–0.1)
Basophils Relative: 1 %
Eosinophils Absolute: 0 10*3/uL (ref 0.0–0.5)
Eosinophils Relative: 0 %
HCT: 32.9 % — ABNORMAL LOW (ref 39.0–52.0)
Hemoglobin: 10.4 g/dL — ABNORMAL LOW (ref 13.0–17.0)
Immature Granulocytes: 9 %
Lymphocytes Relative: 21 %
Lymphs Abs: 1.4 10*3/uL (ref 0.7–4.0)
MCH: 27.8 pg (ref 26.0–34.0)
MCHC: 31.6 g/dL (ref 30.0–36.0)
MCV: 88 fL (ref 80.0–100.0)
Monocytes Absolute: 1.6 10*3/uL — ABNORMAL HIGH (ref 0.1–1.0)
Monocytes Relative: 25 %
Neutro Abs: 2.8 10*3/uL (ref 1.7–7.7)
Neutrophils Relative %: 44 %
Platelets: 182 10*3/uL (ref 150–400)
RBC: 3.74 MIL/uL — ABNORMAL LOW (ref 4.22–5.81)
RDW: 21 % — ABNORMAL HIGH (ref 11.5–15.5)
Smear Review: NORMAL
WBC: 6.4 10*3/uL (ref 4.0–10.5)
nRBC: 2.7 % — ABNORMAL HIGH (ref 0.0–0.2)

## 2020-09-27 NOTE — Progress Notes (Signed)
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  He was here last month with similar and returns with increased coughing with bright red blood production. CT scan showed cavitary lesion in the right upper lobe.  Suspect lung abscess. Pulminology following. h/o hypothermic during hospitalizations (thought to be 2/2 relative adrenal insufficiency.)    PT Comments    Pt was long sitting in bed upon arriving. Agrees to PT session and is cooperative and pleasant throughout. He endorses feeling ready to go home." I'm not going to go to rehab, I'm going home as soon as MD lets me." He was easily able to exit bed, stand, and ambulate with RW without physical assistance. He was on 5 L O2 throughout session with sao2 > 88%. Did drop to 89 % 2 x. Pt has a lot of coughing during session however no blood in mucous. At conclusion of session, pt was back in bed resting comfortably. Author feels pt could manage at home at DC if unwilling to go to SNF.    Follow Up Recommendations  SNF/ Home with [facility] PT if pt remains unwilling to DC to SNF     Equipment Recommendations  Rolling walker with 5" wheels;3in1 (PT)       Precautions / Restrictions Precautions Precautions: Fall    Mobility  Bed Mobility Overal bed mobility: Needs Assistance Bed Mobility: Supine to Sit     Supine to sit: Supervision Sit to supine: Supervision        Transfers Overall transfer level: Modified independent Equipment used: Rolling walker (2 wheeled) Transfers: Sit to/from Stand Sit to Stand: Modified independent (Device/Increase time)            Ambulation/Gait Ambulation/Gait assistance: Supervision Gait Distance (Feet): 120 Feet Assistive device: Rolling walker (2  wheeled) Gait Pattern/deviations: Decreased step length - right;Decreased step length - left;Decreased stride length;Trunk flexed Gait velocity: decreased   General Gait Details: pt was easily able to ambulate 120 ft with RW without difficulty. Sao2 stable on 5L however pt has frequent coughing spells without blood in mucous       Balance Overall balance assessment: Needs assistance Sitting-balance support: Bilateral upper extremity supported;Feet supported Sitting balance-Leahy Scale: Good     Standing balance support: Bilateral upper extremity supported;During functional activity Standing balance-Leahy Scale: Fair        Cognition Arousal/Alertness: Awake/alert Behavior During Therapy: WFL for tasks assessed/performed Overall Cognitive Status: Within Functional Limits for tasks assessed      General Comments: Pt is A and O x 4             Pertinent Vitals/Pain Pain Assessment: No/denies pain     PT Goals (current goals can now be found in the care plan section) Acute Rehab PT Goals Patient Stated Goal: go home Progress towards PT goals: Progressing toward goals    Frequency    Min 2X/week      PT Plan Current plan remains appropriate       AM-PAC PT "6 Clicks" Mobility   Outcome Measure  Help needed turning from your back to your side while in a flat bed without using bedrails?: None Help needed moving from lying on your back to sitting on the side of a flat bed without  using bedrails?: A Little Help needed moving to and from a bed to a chair (including a wheelchair)?: A Little Help needed standing up from a chair using your arms (e.g., wheelchair or bedside chair)?: A Little Help needed to walk in hospital room?: A Little Help needed climbing 3-5 steps with a railing? : A Little 6 Click Score: 19    End of Session Equipment Utilized During Treatment: Gait belt;Oxygen Activity Tolerance: Patient tolerated treatment well Patient left: in bed;with  call bell/phone within reach;with bed alarm set Nurse Communication: Mobility status PT Visit Diagnosis: Unsteadiness on feet (R26.81);Muscle weakness (generalized) (M62.81);Difficulty in walking, not elsewhere classified (R26.2)     Time: 1145-1200 PT Time Calculation (min) (ACUTE ONLY): 15 min  Charges:  $Gait Training: 8-22 mins                     Julaine Fusi PTA 09/27/20, 4:08 PM

## 2020-09-27 NOTE — Progress Notes (Signed)
[place] [facility] PROGRESS NOTE    Marcus [name]  EUM:[id number] DOB: [birth year] DOA: 09/17/2020 PCP: [name], FNP    Brief Narrative:  Marcus [name] is a 60 y.o. M with RA c/b Felty syndrome/neutropenia, smoking, and COPD as well as recently diagnosed MRSA abscess who presents with failure of outpatient treamtent.  Patient admitted last month for 8 days for cough and shortness of breath as well as hemoptysis found to have a large cavitary lesion in the right upper lobe concerning for lung abscess.  Sputum was positive for MRSA and so he was discharged on 6 weeks of oral doxycycline.  He reports adherence to his medication after some initial improvement with IV vancomycin in the hospital, he then started to get persistently worse, finally was coughing up blood and had chest pain and malaise/fatigue, so he came to the ER.  He was started on linezolid by ID, continue to have some hemoptysis, pulmonology is recommending bronchoscopy and a possible surgery.  EEG was negative for any seizure-like activity.  Imaging with concern of spinal stenosis but no emergent need for any intervention.  Neurology is recommending outpatient neurosurgery evaluation once improved from current hospitalization.  If patient developed any incontinence or worsening focal weakness then neurosurgery should be consulted urgently.  Patient was started on Granix due to worsening neutropenia , received 2 doses which resulted in markedly elevated leukocytosis and neutrophils-discontinue today after talking with hematology.   Resp cx with Candida dublisiensis-pulmonology is started him on Diflucan initially and then later ID stopped that thinking a test just a colonization. Fungitell was negative. Patient slowly improving clinically but repeat chest x-ray today was without any significant improvement but remained stable. Per pulmonology most likely will need a thoracic surgery-they will talk with thoracic surgeon  at [facility].  6/22- bronch and CTS on hold since doing better per CCM. But plan to notify CTS of the case.  6/23 patient reports hemoptysis is improved and shortness of breath is improving.  No other complaints   Assessment & Plan:  Acute on chronic respiratory failure with hypoxia.  Improved, currently seems to be around his baseline. At baseline he is on 2 L.  6/23 weaned down to baseline oxygen.  We will have patient do ambulatory O2 to Continue I-S and flutter valve PCCM following   Necrotizing pneumonia causing sepsis.  MRSA positive and Candida positive. Presented with tachycardia, tachypnea, leukopenia, and blood pressure less than 100. Initially received vancomycin, cefepime and Flagyl later transitioned to linezolid by ID Pulmonology is recommending bronchoscopy, multiple labs including Fungitell is pending, concern of aspergilloma, might need bronchoscopy or a surgery.  Resp cx with Candida dublisiensis.  Fungitell was negative -Diflucan was discontinued by ID thinking that it might be just colonization. -Continue with linezolid -Repeat chest x-ray today was without any significant changes, stable findings. 6/23-spoke to ID plan is to send patient home with linezolid when medically stable and follow-up with PCCM and ID and will need weekly labs while on antibiotics.  Needs 4 weeks of treatment.    Neutropenia.  Noted during last hospitalization.  He was started on Granix for 3 doses by hematology, received 2 doses which resulted in significant increase in leukocytosis and neutrophils, Granix was discontinued after talking with hematology. Leukocytosis and neutrophil with significant decrease i,, today WBC of 2.7 and neutrophil of 0.6. -Hematology ordered just 1 dose of Granix 6/22-wbc has improved to 10.6 s/p Granix given on 6/21. 6/23 WBC 6.4.  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   Resolved Repleted and monitor periodically -Replete potassium as needed and monitor  Shakiness.  There was some witnessed shakiness, neurology was consulted for concern of seizure-like activity.  No shakiness or any other seizure noted.  EEG normal. Neurology signed off and they are recommending outpatient neurosurgery evaluation for some spinal stenosis, no emergent need.  If patient developed any new focal weakness or incontinence then they can be reconsulted and at that point he will need emergent neurosurgery evaluation. -Continue to monitor Per PCCM since he is clinically improving holding off bronc and CT surgery consult.  Chronic diastolic CHF Euvolemic  Intussusception This is an apparently incidental finding, no evidence of small bowel obstruction, belly exam benign Tolerating p.o. intake -Stop PPI   Anemia of chronic disease H/H remains stable. Continue to monitor  Hyponatremia.  Resolved.   Smoking Cessation recommended, modalities discussed  Severe protein calorie malnutrition As evidenced by severe loss of subcutaneous muscle mass and fat, chronic infection, smoking -Consutl dietitian  Disposition: Status is: Inpatient  Remains inpatient appropriate because: Dispo: The patient is from: Home              Anticipated d/c is to: SNF              Patient currently is not medically stable to d/c.   Difficult to place patient No              Level of care: Med-Surg      DVT prophylaxis:   SCDs.  Code Status: Full code Family Communication:   Subjective: Denies chest pain.  Objective: Vitals:   09/26/20 2030 09/27/20 0330 09/27/20 0835 09/27/20 1104  BP: 110/67 111/78 119/77 98/63  Pulse: 92 80 65 88  Resp: _0 Temp: (!) 97.4 F (36.3 C) (!) 97.4 F (36.3 C) 97.9 F (36.6 C) 97.8 F (36.6 C)  TempSrc:  Oral    SpO2: 96% 98% 100% 94%  Weight:      Height:        Intake/Output Summary (Last 24 hours) at 09/27/2020 1421 Last data filed  at 09/27/2020 0942 Gross per 24 hour  Intake --  Output 1800 ml  Net -1800 ml   Filed Weights   09/22/20 0500 09/23/20 0512 09/25/20 0434  Weight: 59.4 kg 59.1 kg 57.5 kg    Examination: NAD, calm Decreased breath sounds no wheezing  Regular s1/s2 no gallop Soft benign +bs No edema Axox4 grossly intact  Data Reviewed: I have personally reviewed following labs and imaging studies:  CBC: Recent Labs  Lab 09/23/20 0528 09/24/20 0702 09/25/20 0416 09/26/20 0425 09/27/20 0403  WBC 17.2* 4.1 2.7* 10.6* 6.4  NEUTROABS 14.0* 1.6* 0.6* 5.7 2.8  HGB 11.4* 11.5* 10.8* 10.5* 10.4*  HCT 36.5* 36.9* 35.4* 33.2* 32.9*  MCV 89.7 87.6 89.2 87.4 88.0  PLT 200 227 221 199 630   Basic Metabolic Panel: Recent Labs  Lab 09/23/20 0528 09/24/20 0702 09/25/20 0416 09/27/20 0403  NA 134* 135  --  134*  K 4.5 5.0  --  4.9  CL 93* 94*  --  92*  CO2 38* 35*  --  34*  GLUCOSE 117* 123*  --  106*  BUN 16 23*  --  28*  CREATININE 0.46* 0.56*  --  0.39*  CALCIUM 8.3* 8.3*  --  8.4*  MG  --   --  2.2  --    GFR: Estimated Creatinine Clearance: 80.9 mL/min (A) (by C-G formula based on SCr of 0.39 mg/dL (L)). Liver Function Tests: No results for input(s): AST, ALT, ALKPHOS, BILITOT, PROT, ALBUMIN in the last 168 hours.  No results for input(s): LIPASE, AMYLASE in the last 168 hours. No results for input(s): AMMONIA in the last 168 hours. Coagulation Profile: No results for input(s): INR, PROTIME in the last 168 hours.  Cardiac Enzymes: No results for input(s): CKTOTAL, CKMB, CKMBINDEX, TROPONINI in the last 168 hours. BNP (last 3 results) No results for input(s): PROBNP in the last 8760 hours. HbA1C: No results for input(s): HGBA1C in the last 72 hours. CBG: No results for input(s): GLUCAP in the last 168 hours. Lipid Profile: No results for input(s): CHOL, HDL, LDLCALC, TRIG, CHOLHDL, LDLDIRECT in the last 72 hours. Thyroid Function Tests: No results for input(s): TSH, T4TOTAL,  FREET4, T3FREE, THYROIDAB in the last 72 hours. Anemia Panel: No results for input(s): VITAMINB12, FOLATE, FERRITIN, TIBC, IRON, RETICCTPCT in the last 72 hours. Urine analysis:    Component Value Date/Time   COLORURINE AMBER (A) 08/22/2020 1253   APPEARANCEUR HAZY (A) 08/22/2020 1253   LABSPEC 1.024 08/22/2020 1253   PHURINE 5.0 08/22/2020 1253   GLUCOSEU NEGATIVE 08/22/2020 1253   HGBUR NEGATIVE 08/22/2020 [place] 08/22/2020 1253   KETONESUR NEGATIVE 08/22/2020 1253   PROTEINUR NEGATIVE 08/22/2020 1253   UROBILINOGEN 0.2 10/21/2012 1737   NITRITE NEGATIVE 08/22/2020 1253   LEUKOCYTESUR NEGATIVE 08/22/2020 1253   Sepsis Labs: _0 (procalcitonin:4,lacticacidven:4)  ) Recent Results (from the past 240 hour(s))  MRSA PCR Screening     Status: Abnormal   Collection Time: 09/18/20  8:36 AM   Specimen: Nasopharyngeal  Result Value Ref Range Status   MRSA by PCR POSITIVE (A) NEGATIVE Final    Comment:        The GeneXpert MRSA Assay (FDA approved for NASAL specimens only), is one component of a comprehensive MRSA colonization surveillance program. It is not intended to diagnose MRSA infection nor to guide or monitor treatment for MRSA infections. RESULT CALLED TO, READ BACK BY AND VERIFIED WITH: Leota Jacobsen 1029 09/18/20 GM Performed at [facility], [address] [place], [place] [address]   Aspergillus Ag, BAL/Serum     Status: None   Collection Time: 09/19/20  5:05 AM   Specimen: Vein; Blood  Result Value Ref Range Status   Aspergillus Ag, BAL/Serum 0.02 0.00 - 0.49 Index Final    Comment: (NOTE) Performed At: [facility] [place], [place] [id number] Rush Farmer MD [id number]   Expectorated Sputum Assessment w Gram Stain, Rflx to Resp Cult     Status: None   Collection Time: 09/20/20  5:33 AM   Specimen: Expectorated Sputum  Result Value Ref Range Status   Specimen Description EXPECTORATED  SPUTUM  Final   Special Requests NONE  Final   Sputum evaluation   Final    THIS SPECIMEN IS ACCEPTABLE FOR SPUTUM CULTURE Performed at [facility], [address], [place], [place] [address]    Report Status 09/20/2020 FINAL  Final  Culture, Respiratory w Gram Stain     Status: None   Collection Time: 09/20/20  5:33 AM  Result Value Ref Range Status   Specimen Description   Final    EXPECTORATED SPUTUM Performed at [facility], [address], [place], [place] [address]    Special Requests   Final  NONE Reflexed from [id number] Performed at [place], [place] [address]    Gram Stain   Final    ABUNDANT WBC PRESENT,BOTH PMN AND MONONUCLEAR FEW GRAM POSITIVE COCCI IN PAIRS IN CLUSTERS Performed at [facility], [place] [address] [place], [place] [address]    Culture   Final    ABUNDANT METHICILLIN RESISTANT STAPHYLOCOCCUS AUREUS   Report Status 09/23/2020 FINAL  Final   Organism ID, Bacteria METHICILLIN RESISTANT STAPHYLOCOCCUS AUREUS  Final      Susceptibility   Methicillin resistant staphylococcus aureus - MIC*    CIPROFLOXACIN <=0.5 SENSITIVE Sensitive     ERYTHROMYCIN >=8 RESISTANT Resistant     GENTAMICIN <=0.5 SENSITIVE Sensitive     OXACILLIN >=4 RESISTANT Resistant     TETRACYCLINE <=1 SENSITIVE Sensitive     VANCOMYCIN <=0.5 SENSITIVE Sensitive     TRIMETH/SULFA <=10 SENSITIVE Sensitive     CLINDAMYCIN <=0.25 SENSITIVE Sensitive     RIFAMPIN <=0.5 SENSITIVE Sensitive     Inducible Clindamycin NEGATIVE Sensitive     * ABUNDANT METHICILLIN RESISTANT STAPHYLOCOCCUS AUREUS  Acid Fast Smear (AFB)     Status: None   Collection Time: 09/20/20  5:33 AM   Specimen: Sputum  Result Value Ref Range Status   AFB Specimen Processing Concentration  Final   Acid Fast Smear Negative  Final    Comment: (NOTE) Performed At: [facility] [address] [place], [place] [id number] Rush Farmer MD  [id number]    Source (AFB) EXPECTORATED SPUTUM  Final    Comment: Performed at [facility], [address] [place], [place] [address]  Anaerobic culture w Gram Stain     Status: None   Collection Time: 09/20/20  5:33 AM   Specimen: Expectorated Sputum  Result Value Ref Range Status   Specimen Description   Final    EXPECTORATED SPUTUM Performed at [facility], [address], [place], [place] [address]    Special Requests   Final    Normal Performed at [facility], [address] [place], [place] [address]    Gram Stain   Final    RARE WBC PRESENT,BOTH PMN AND MONONUCLEAR RARE GRAM POSITIVE COCCI IN PAIRS IN CLUSTERS    Culture   Final    RARE PREVOTELLA NANCEIENSIS BETA LACTAMASE POSITIVE Performed at [facility], [place] [address] [place], [place] [address]    Report Status 09/24/2020 FINAL  Final  Culture, fungus without smear     Status: Abnormal (Preliminary result)   Collection Time: 09/20/20  5:33 AM   Specimen: Expectorated Sputum  Result Value Ref Range Status   Specimen Description   Final    EXPECTORATED SPUTUM Performed at [facility], [address], [place], [place] [address]    Special Requests   Final    Normal Performed at [facility], [address], [place], [place] [address]    Culture (A)  Final    CANDIDA DUBLINIENSIS CONTINUING TO HOLD Performed at [facility], [place] [address] [place], [place] [address]    Report Status PENDING  Incomplete  Expectorated Sputum Assessment w Gram Stain, Rflx to Resp Cult     Status: None   Collection Time: 09/20/20  9:13 AM   Specimen: Sputum  Result Value Ref Range Status   Specimen Description SPUTUM  Final   Special Requests Normal  Final   Sputum evaluation   Final    THIS  SPECIMEN IS ACCEPTABLE FOR SPUTUM CULTURE Performed at [facility], [address] [place], [place] [address]    Report Status 09/20/2020 FINAL  Final   Culture, Respiratory w Gram Stain     Status: None   Collection Time: 09/20/20  9:13 AM   Specimen: SPU  Result Value Ref Range Status   Specimen Description   Final    SPUTUM Performed at [facility], [address], [place], [place] [address]    Special Requests   Final    Normal Reflexed from [id number] Performed at [place] [place], [place] [address]    Gram Stain   Final    RARE WBC PRESENT,BOTH PMN AND MONONUCLEAR RARE GRAM POSITIVE COCCI IN PAIRS IN CLUSTERS    Culture   Final    FEW STAPHYLOCOCCUS AUREUS SUSCEPTIBILITIES PERFORMED ON PREVIOUS CULTURE WITHIN THE LAST 5 DAYS. Performed at [facility], [place] [address], [place], [place] [address]    Report Status 09/23/2020 FINAL  Final  Aspergillus Ag, BAL/Serum     Status: None   Collection Time: 09/25/20 10:54 AM  Result Value Ref Range Status   Aspergillus Ag, BAL/Serum 0.04 0.00 - 0.49 Index Final    Comment: (NOTE) Performed At: [facility] [address] [place], [place] [id number] Rush Farmer MD [id number]          Radiology Studies: No results found.    Scheduled Meds:  Chlorhexidine Gluconate Cloth  6 each Topical Q0600   feeding supplement  237 mL Oral TID BM   guaiFENesin  600 mg Oral BID   linezolid  600 mg Oral Q12H   metroNIDAZOLE  500 mg Oral BID   multivitamin with minerals  1 tablet Oral Daily   mupirocin ointment   Nasal BID   nicotine  21 mg Transdermal Daily   predniSONE  10 mg Oral BID WC   Continuous Infusions:      LOS: 10 days    Time spent: 35 minutes with >50 % on coc   Nolberto Hanlon, MD [facility] 09/27/2020, 2:21 PM     Please page though [place] or Epic secure chat:  For [employer], Adult nurse        Chatting all day

## 2020-09-27 NOTE — Progress Notes (Addendum)
   Date of Admission:  09/17/2020     ID: Marcus [name] is a 60 y.o. male  Principal Problem:   Acute on chronic respiratory failure with hypoxia (HCC) Active Problems:   Seropositive rheumatoid arthritis (HCC)   Chronic obstructive pulmonary disease (HCC)   Sepsis due to undetermined organism (HCC)   Hypokalemia   Chronic diastolic CHF (congestive heart failure) (HCC)   Cavitary pneumonia   Acquired neutropenia (HCC)   Hemoptysis   Lung abscess (HCC)   Protein-calorie malnutrition, severe    Subjective: Feeling better Less cough Very little hemoptysis  Medications:   Chlorhexidine Gluconate Cloth  6 each Topical Q0600   feeding supplement  237 mL Oral TID BM   guaiFENesin  600 mg Oral BID   linezolid  600 mg Oral Q12H   metroNIDAZOLE  500 mg Oral BID   multivitamin with minerals  1 tablet Oral Daily   mupirocin ointment   Nasal BID   nicotine  21 mg Transdermal Daily   predniSONE  10 mg Oral BID WC    Objective: Vital signs in last 24 hours: Temp:  [97.4 F (36.3 C)-97.9 F (36.6 C)] 97.8 F (36.6 C) (06/23 1104) Pulse Rate:  [65-92] 88 (06/23 1104) Resp:  [18-20] 18 (06/23 1104) BP: (98-119)/(63-78) 98/63 (06/23 1104) SpO2:  [94 %-100 %] 94 % (06/23 1104)  PHYSICAL EXAM:  General: Alert, cooperative, no distress, appears stated age.  Lungs: b/l air entry- crepts b/l. Heart: irregular Abdomen: Soft, non-tender,not distended. Bowel sounds normal. No masses Extremities: atraumatic, no cyanosis. No edema. No clubbing Skin: No rashes or lesions. Or bruising Lymph: Cervical, supraclavicular normal. Neurologic: Grossly non-focal  Lab Results Recent Labs    09/26/20 0425 09/27/20 0403  WBC 10.6* 6.4  HGB 10.5* 10.4*  HCT 33.2* 32.9*  NA  --  134*  K  --  4.9  CL  --  92*  CO2  --  34*  BUN  --  28*  CREATININE  --  0.39*     Microbiology: Sputum culture MRSA Studies/Results: No results found.   Assessment/Plan:  MRSA lung abscess with  surrounding necrotizing pneumonia.  Right upper lobe is involved.  Also has 1 on the left upper lobe.  On linezolid.  Clinically improved.  Needing less oxygen on and also less hemoptysis.  Chest x-ray done earlier this week did not show much of a change but slight improvement. On linezolid day 10.  We will try to push linezolid as long as he can take.  Will need at least 4 weeks of treatment.  But doubt that he would be able to tolerate linezolid for that long duration because of risk of toxicity.  Another option would be Bactrim. I will closely follow him as OP. Will need weekly labs  Rheumatoid arthritis with Felty syndrome  History of splenectomy  Neutropenia for which he was given G-CSF with improvement  COPD on oxygen dependent  Nicotine dependent  Discussed the management with patient , care team and pharmacist  Addendum- He needs metronidazole 500mg  PO BID for 3 weeks for prevotella in sputum culture

## 2020-09-27 NOTE — Progress Notes (Signed)
SATURATION QUALIFICATIONS: (This note is used to comply with regulatory documentation for home oxygen)  Patient Saturations on Room Air at Rest = n/a%  Patient Saturations on Room Air while Ambulating = n/a%  Patient Saturations on 3 Liters of oxygen while Ambulating = 79%  Please briefly explain why patient needs home oxygen: Pt is on 3L chronic. Ambulated on 3L SpO2 decreased to 79%.

## 2020-09-27 NOTE — Progress Notes (Signed)
Pulmonary Medicine          Date: 09/27/2020,   MRN# [id number] Marcus [name] 08/27/1960      HISTORY OF PRESENT ILLNESS   60 yo M w/ hx of COPD, Feltys syndrome (enlarged spleen, low WBC count associated with RA), epigastric hernia and rheumatoid arthritis.  Patient had admission 01/2020 to [facility] with findings of pneumoperitoneum he was intubated during this time, had procedure done with graham patch incision hernia repair s/p exlap. He had MRSA pneumonia 08/2020, he has been on antibiotics with doxycycline on outpatient. He came in to ER with complaints of hemoptysis.  PCCM consultation for additional evaluation and management. Patient reports having visual symptoms and seizure like activity few weeks ago. NEURO w/u on going.    09/19/20- patient seems slightly improved, his microbiology is still in process. Currently on empiric therapy.  MRSA is +.  Hemoptysis has slowed down.   09/20/20- MRSA + pneumonia/abscess, now on linozolid, bronch to r/o involvement (aspergillosis.vs malignancy etc)t, radiography has failed prior appropriate therapy.    09/22/20- patient is still hemoptysizing.  He is on room air and feels weak. No changes to medical plan. Resp cx with Candida dublisiensis have initiated duflucan IV, will ask pharm/ID team to reivew meds and narrow   09/23/20- patient is worse with increased O2 requirement, we reviewed CT and CXR together.  Broadening of regimen with vancomycin and zosyn.   09/24/20- patient is improved, he still coughs with hemoptysis.  Fungitell is negative but resp culture is + for fungus and candida.  MRSA +   09/25/20-  still spitting up blood, sputum positive Candida dublisiensis. cxr today some possible improvement   09/26/20 - tsp of blood per sputum thus far. Feeling stronger  09/27/20 - ambulating in room, fio2 down to 3 liters, less hemoptysis.     PAST MEDICAL HISTORY   Past Medical History:  Diagnosis Date  . COPD (chronic  obstructive pulmonary disease) ([place])   . Felty syndrome ([place])   . Hernia, epigastric   . Pancytopenia ([place])   . Seropositive rheumatoid arthritis ([place])   . Tobacco use disorder      SURGICAL HISTORY   Past Surgical History:  Procedure Laterality Date  . INCISIONAL HERNIA REPAIR  01/17/2020   Procedure: HERNIA REPAIR INCISIONAL AND Silvestre Gunner;  Surgeon: Kinsinger, Arta Bruce, MD;  Location: [facility];  Service: General;;  . LAPAROTOMY N/A 01/17/2020   Procedure: EXPLORATORY LAPAROTOMY;  Surgeon: Kieth Brightly Arta Bruce, MD;  Location: [facility];  Service: General;  Laterality: N/A;     FAMILY HISTORY   Family History  Problem Relation Age of Onset  . CAD Brother   . COPD Brother      SOCIAL HISTORY   Social History   Tobacco Use  . Smoking status: Every Day    Packs/day: 1.00    Pack years: 0.00    Types: Cigarettes  . Smokeless tobacco: Never  Substance Use Topics  . Alcohol use: No    Alcohol/week: 0.0 standard drinks  . Drug use: Yes    Types: Marijuana     MEDICATIONS    Home Medication:    Current Medication:  Current Facility-Administered Medications:  .  acetaminophen (TYLENOL) tablet 650 mg, 650 mg, Oral, Q6H PRN, 650 mg at 09/21/20 1925 **OR** acetaminophen (TYLENOL) suppository 650 mg, 650 mg, Rectal, Q6H PRN, Para Skeans, MD .  alum & mag hydroxide-simeth (MAALOX/MYLANTA) 200-200-20 MG/5ML suspension 30 mL, 30 mL, Oral,  Q4H PRN, Lorella Nimrod, MD, 30 mL at 09/25/20 0028 .  benzonatate (TESSALON) capsule 200 mg, 200 mg, Oral, BID PRN, Athena Masse, MD, 200 mg at 09/27/20 0932 .  Chlorhexidine Gluconate Cloth 2 % PADS 6 each, 6 each, Topical, Q0600, Lorella Nimrod, MD, 6 each at 09/26/20 1023 .  chlorpheniramine-HYDROcodone (TUSSIONEX) 10-8 MG/5ML suspension 5 mL, 5 mL, Oral, Q12H PRN, Athena Masse, MD, 5 mL at 09/26/20 2231 .  feeding supplement (ENSURE ENLIVE / ENSURE PLUS) liquid 237 mL, 237 mL, Oral, TID BM, Lorella Nimrod, MD, 237 mL at  09/27/20 0934 .  guaiFENesin (MUCINEX) 12 hr tablet 600 mg, 600 mg, Oral, BID, Mansy, Jan A, MD, 600 mg at 09/27/20 0932 .  ipratropium-albuterol (DUONEB) 0.5-2.5 (3) MG/3ML nebulizer solution 3 mL, 3 mL, Nebulization, Q4H PRN, Athena Masse, MD, 3 mL at 09/25/20 2146 .  linezolid (ZYVOX) tablet 600 mg, 600 mg, Oral, Q12H, Ravishankar, Jayashree, MD, 600 mg at 09/27/20 0932 .  metroNIDAZOLE (FLAGYL) tablet 500 mg, 500 mg, Oral, BID, Ravishankar, Jayashree, MD, 500 mg at 09/27/20 0932 .  multivitamin with minerals tablet 1 tablet, 1 tablet, Oral, Daily, Lorella Nimrod, MD, 1 tablet at 09/27/20 0932 .  mupirocin ointment (BACTROBAN) 2 %, , Nasal, BID, Lorella Nimrod, MD, Given at 09/27/20 [phone]  nicotine (NICODERM CQ - dosed in mg/24 hours) patch 21 mg, 21 mg, Transdermal, Daily, Florina Ou V, MD, 21 mg at 09/27/20 0932 .  predniSONE (DELTASONE) tablet 10 mg, 10 mg, Oral, BID WC, Para Skeans, MD, 10 mg at 09/27/20 0932    ALLERGIES   Levaquin [levofloxacin in d5w] and Methotrexate     REVIEW OF SYSTEMS    Review of Systems:  Gen:  Denies  fever, sweats, chills weigh loss  HEENT: Denies blurred vision, double vision, ear pain, eye pain, hearing loss, nose bleeds, sore throat Cardiac:  No dizziness, chest pain or heaviness, chest tightness,edema Resp:   Denies cough or sputum porduction, shortness of breath,wheezing, hemoptysis,  Gi: Denies swallowing difficulty, stomach pain, nausea or vomiting, diarrhea, constipation, bowel incontinence Gu:  Denies bladder incontinence, burning urine Ext:   Denies Joint pain, stiffness or swelling Skin: Denies  skin rash, easy bruising or bleeding or hives Endoc:  Denies polyuria, polydipsia , polyphagia or weight change Psych:   Denies depression, insomnia or hallucinations   Other:  All other systems negative   VS: BP 98/63 (BP Location: Right Arm)   Pulse 88   Temp 97.8 F (36.6 C)   Resp 18   Ht 5' 5.98" (1.676 m)   Wt 57.5 kg    SpO2 94%   BMI 20.47 kg/m      PHYSICAL EXAM    GENERAL:NAD, no fevers, chills, no weakness no fatigue HEAD: Normocephalic, atraumatic.  EYES: Pupils equal, round, reactive to light. Extraocular muscles intact. No scleral icterus.  MOUTH: Moist mucosal membrane. Dentition intact. No abscess noted.  EAR, NOSE, THROAT: Clear without exudates. No external lesions.  NECK: Supple. No thyromegaly. No nodules. No JVD.  PULMONARY: Diffuse coarse rhonchi right sided +wheezes CARDIOVASCULAR: S1 and S2. Regular rate and rhythm. No murmurs, rubs, or gallops. No edema. Pedal pulses 2+ bilaterally.  GASTROINTESTINAL: Soft, nontender, nondistended. No masses. Positive bowel sounds. No hepatosplenomegaly.  MUSCULOSKELETAL: No swelling, clubbing, or edema. Range of motion full in all extremities.  NEUROLOGIC: Cranial nerves II through XII are intact. No gross focal neurological deficits. Sensation intact. Reflexes intact.  SKIN: No ulceration, lesions, rashes, or  cyanosis. Skin warm and dry. Turgor intact.  PSYCHIATRIC: Mood, affect within normal limits. The patient is awake, alert and oriented x 3. Insight, judgment intact.       IMAGING    DG Chest 2 View  Result Date: 09/25/2020 CLINICAL DATA:  Cough.  Follow-up treatment. EXAM: CHEST - 2 VIEW COMPARISON:  CT 09/17/2020.  Chest x-ray 09/17/2020. FINDINGS: Mediastinum and hilar structures appears stable. Prominent cavitary infiltrate/mass is again noted in the anterior medial aspect of the right lung. Slight improvement from prior exams may be present. Mild adjacent infiltrate also again noted. Continued follow-up exam suggested to demonstrate complete resolution. No scratched it tiny right pleural effusion cannot be excluded. No pneumothorax. Old right rib fractures again noted. Degenerative change thoracic spine. IMPRESSION: Prominent cavitary infiltrate/mass is again noted in the anteromedial aspect of the right lung. Slight improvement from prior  exam may be present. Mild adjacent infiltrate also again noted. Continued follow-up exam suggested to demonstrate complete resolution. Electronically Signed   By: Marcello Moores  Register   On: 09/25/2020 11:48   DG Chest 2 View  Result Date: 09/06/2020 CLINICAL DATA:  Shortness of breath.  Cough. EXAM: CHEST - 2 VIEW COMPARISON:  Chest x-ray 08/28/2020.  CT 08/22/2020. FINDINGS: Mediastinum unremarkable. Heart size normal. Cavitary process in the right mid lung again noted. No change identified. No pleural effusion or pneumothorax. Old right rib fractures. IMPRESSION: Cavitary process in the right mid lung again noted. Cavitary process best identified by prior CT. Chest is unchanged from prior exam. Electronically Signed   By: Marcello Moores  Register   On: 09/06/2020 14:04   CT Angio Chest PE W and/or Wo Contrast  Result Date: 09/17/2020 CLINICAL DATA:  Hemoptysis, shortness of breath, abdominal distension EXAM: CT ANGIOGRAPHY CHEST CT ABDOMEN AND PELVIS WITH CONTRAST TECHNIQUE: Multidetector CT imaging of the chest was performed using the standard protocol during bolus administration of intravenous contrast. Multiplanar CT image reconstructions and MIPs were obtained to evaluate the vascular anatomy. Multidetector CT imaging of the abdomen and pelvis was performed using the standard protocol during bolus administration of intravenous contrast. CONTRAST:  78mL OMNIPAQUE IOHEXOL 350 MG/ML SOLN COMPARISON:  CT 06/20/2020, 06/22/2020 FINDINGS: CTA CHEST FINDINGS Cardiovascular: Satisfactory opacification of the pulmonary arteries to the segmental level. No evidence of pulmonary embolism. Thoracic aorta is normal in course and caliber. Atherosclerotic calcifications of the aorta and coronary arteries. Normal heart size. No pericardial effusion. Mediastinum/Nodes: Mildly prominent mediastinal lymph nodes are again noted, likely reactive. No axillary or hilar lymphadenopathy. Lungs/Pleura: Redemonstrated cavitary mass located  within the inferomedial aspect of the right upper lobe measuring approximately 9.9 x 6.6 x 7.4 cm (previously measured approximately 7.8 x 4.5 x 5.5 cm on 08/22/2020. Increased surrounding consolidation and ground-glass opacity within the adjacent right upper lobe and right middle lobe previously seen cavitary lesion within the medial aspect of the left upper lobe has resolved with small thin walled pleural cyst remaining (series 6, image 44). Scattered areas of ground-glass opacity within the bilateral lower lobes and upper lobes. Background of moderate emphysema. No pleural effusion or pneumothorax. Musculoskeletal: Remote right sixth and seventh rib fractures. No new or acute osseous findings. No chest wall abnormality. Review of the MIP images confirms the above findings. CT ABDOMEN and PELVIS FINDINGS Hepatobiliary: Cholelithiasis without evidence of pericholecystic inflammation by CT. Liver within normal limits. No biliary dilatation. Pancreas: Unremarkable. No pancreatic ductal dilatation or surrounding inflammatory changes. Spleen: Prior splenectomy. Adrenals/Urinary Tract: Unremarkable adrenal glands. Kidneys enhance symmetrically without focal  lesion, stone, or hydronephrosis. Ureters are nondilated. Urinary bladder appears unremarkable. Stomach/Bowel: Stomach is within normal limits. Appendix appears normal (series 2, image 58). Incidental note of a small bowel-small bowel intussusception within the mid left abdomen (series 2, image 38). No evidence of bowel wall thickening, distention, or inflammatory changes. Vascular/Lymphatic: Fusiform infrarenal abdominal aortic aneurysm measuring up to 3.1 cm, unchanged. Aortoiliac atherosclerosis. No abdominopelvic lymphadenopathy. Reproductive: Prostate is unremarkable. Other: No free fluid. No abdominopelvic fluid collection. No pneumoperitoneum. Fat containing midline supraumbilical hernia without evidence of complication. Musculoskeletal: No acute or  significant osseous findings. Review of the MIP images confirms the above findings. IMPRESSION: 1. No evidence of acute pulmonary embolism. 2. Enlarging cavitary mass within the inferomedial aspect of the right upper lobe measuring up to 9.9 cm (previously measured approximately 7.8 x 4.5 x 5.5 cm on 08/22/2020). Increased surrounding consolidation and ground-glass opacity within the adjacent right upper lobe and right middle lobe. Findings remain most compatible with cavitary/necrotizing pneumonia. 3. Scattered areas of ground-glass opacity within the bilateral lower lobes and upper lobes, concerning for multifocal infection. 4. Interval resolution of previously seen cavitary lesion within the medial aspect of the left upper lobe with small thin walled pleural cyst remaining. 5. Cholelithiasis without evidence of pericholecystic inflammation by CT. 6. Incidental note of a small bowel-small bowel intussusception within the mid left abdomen. No evidence of bowel obstruction. 7. Fat containing midline supraumbilical hernia without evidence of complication. 8. Stable infrarenal abdominal aortic aneurysm measuring up to 3.1 cm. Recommend follow-up ultrasound every 3 years. This recommendation follows ACR consensus guidelines: White Paper of the ACR Incidental Findings Committee II on Vascular Findings. J Am Coll Radiol 2013; 10:789-794. Aortic Atherosclerosis (ICD10-I70.0) and Emphysema (ICD10-J43.9). Electronically Signed   By: Davina Poke D.O.   On: 09/17/2020 16:38   MR BRAIN W WO CONTRAST  Result Date: 09/18/2020 CLINICAL DATA:  Brain mass or lesion. 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 Recent MRSA pneumonia. Patient reports visual symptoms and seizure-like activity a few weeks ago. EXAM: MRI HEAD WITHOUT AND WITH CONTRAST TECHNIQUE: Multiplanar, multiecho pulse sequences of the brain and surrounding structures were obtained without and  with intravenous contrast. CONTRAST:  67mL GADAVIST GADOBUTROL 1 MMOL/ML IV SOLN COMPARISON:  No pertinent prior exams available for comparison. FINDINGS: Brain: Intermittently motion degraded examination, limiting evaluation. Most notably, there is moderate/severe motion degradation of the sagittal T1 weighted sequence, moderate/severe motion degradation of the axial T2/FLAIR sequence, moderate motion degradation of the axial T1 weighted postcontrast sequence and moderate motion degradation of the coronal T1 weighted postcontrast sequence. Mild generalized cerebral and cerebellar atrophy. Advanced patchy and confluent T2/FLAIR hyperintensity within the cerebral white matter, nonspecific but most often secondary to chronic small vessel ischemia. Chronic lacunar infarct within the left lentiform nucleus. Small foci of SWI signal loss within the left basal ganglia and along the margin of the posterior right lateral ventricle, likely reflecting chronic microhemorrhages. Additional subcentimeter focus of SWI signal loss within the midline cerebellum, which may reflect an additional chronic microhemorrhage or cavernoma. There is no acute infarct. No evidence of intracranial mass. No extra-axial fluid collection. No midline shift. Within the limitations of motion degradation, no abnormal intracranial enhancement is identified. Vascular: Expected proximal arterial flow voids. Non dominant intracranial right vertebral artery. Skull and upper cervical spine: Within the limitations of motion degradation, no focal marrow lesion is identified. Sinuses/Orbits: Visualized orbits show no acute finding. 1.9 cm right maxillary sinus mucous  retention cyst. Trace left maxillary sinus mucosal thickening. Other: Trace bilateral mastoid effusions. 2.8 cm ovoid lesion within the left suboccipital scalp demonstrating heterogeneous but predominantly T2 hyperintense signal, T1 hypointense signal, restricted diffusion and no convincing  abnormal enhancement. This is favored to reflect a sebaceous/epidermoid cyst. IMPRESSION: Motion degraded examination, as described and limiting evaluation. No evidence of acute intracranial abnormality. Severe patchy and confluent T2/FLAIR hyperintensity within the cerebral white matter, nonspecific but most often secondary to chronic small vessel ischemia. Chronic left basal ganglia lacunar infarct. Subcentimeter focus of SWI signal loss within the midline cerebellum, which may reflect a chronic microhemorrhage or cavernoma. Mild generalized parenchymal atrophy. Incidentally noted 1.9 cm right maxillary sinus mucous retention cyst. Trace bilateral mastoid effusions. Electronically Signed   By: Kellie Simmering DO   On: 09/18/2020 15:59   MR CERVICAL SPINE W WO CONTRAST  Result Date: 09/19/2020 CLINICAL DATA:  Acute or progressive myelopathy. EXAM: MRI CERVICAL SPINE WITHOUT AND WITH CONTRAST TECHNIQUE: Multiplanar and multiecho pulse sequences of the cervical spine, to include the craniocervical junction and cervicothoracic junction, were obtained without and with intravenous contrast. CONTRAST:  58mL GADAVIST GADOBUTROL 1 MMOL/ML IV SOLN COMPARISON:  No previous cervical imaging. FINDINGS: Alignment: Straightening of the normal cervical lordosis. 1 mm degenerative anterolisthesis C7-T1. Vertebrae: No fracture or focal bone lesion. Cord: No primary cord lesion.  See below regarding stenosis. Posterior Fossa, vertebral arteries, paraspinal tissues: Large sebaceous cyst of the upper left neck. Disc levels: Foramen magnum is widely patent.  C1-2 and C2-3 are normal. C3-4: Endplate osteophytes and bulging of the disc. Canal stenosis with AP diameter in the midline 6 mm. Effacement of the subarachnoid space and slight indentation of the cord. Moderate bilateral foraminal narrowing. C4-5: Endplate osteophytes and protrusion of the disc more prominent towards the left. Effacement of the subarachnoid space and cord  deformity, worse on the left. AP diameter of the canal 5.9 mm. Bilateral foraminal stenosis. C5-6: Spondylosis with endplate osteophytes and bulging of the disc. Canal narrowing with AP diameter in the midline 8.3 mm. No compressive effect upon the cord. Bilateral foraminal narrowing. C6-7: Mild bulging of the disc. No canal stenosis. Foraminal narrowing on the right that could affect the exiting C7 nerve. C7-T1: Facet osteoarthritis with 1 mm of anterolisthesis. No stenosis of the canal or foramina. 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 AP diameter of the canal at C3-4 6 mm. AP diameter of the spinal canal at C4-5 5.9 mm. Effacement of the subarachnoid space with some indentation of the cord. No abnormal cord T2 signal however. Foraminal stenosis that could cause neural compression on either side at C3-4, either side at C4-5, either side at C5-6 and on the right C6-7. Electronically Signed   By: Nelson Chimes M.D.   On: 09/19/2020 18:01   MR THORACIC SPINE W WO CONTRAST  Result Date: 09/19/2020 CLINICAL DATA:  Acute or progressive myelopathy. EXAM: MRI THORACIC WITHOUT AND WITH CONTRAST TECHNIQUE: Multiplanar and multiecho pulse sequences of the thoracic spine were obtained without and with intravenous contrast. CONTRAST:  4mL GADAVIST GADOBUTROL 1 MMOL/ML IV SOLN COMPARISON:  Cervical study same day FINDINGS: Alignment:  Normal Vertebrae: No fracture or focal bone lesion. Cord: No cord compression or primary cord lesion. Spinal canal widely patent throughout the region. No evidence of arteriovenous malformation. Paraspinal and other soft tissues: Negative Disc levels: No significant disc pathology in the thoracic region. Wide patency of the canal and foramina. Ordinary mild facet osteoarthritis without  edema or enhancement. No encroachment upon the neural spaces. IMPRESSION: Negative thoracic study. No cord compression or cord lesion. No significant degenerative changes.  Electronically Signed   By: Nelson Chimes M.D.   On: 09/19/2020 18:03   CT ABDOMEN PELVIS W CONTRAST  Result Date: 09/17/2020 CLINICAL DATA:  Hemoptysis, shortness of breath, abdominal distension EXAM: CT ANGIOGRAPHY CHEST CT ABDOMEN AND PELVIS WITH CONTRAST TECHNIQUE: Multidetector CT imaging of the chest was performed using the standard protocol during bolus administration of intravenous contrast. Multiplanar CT image reconstructions and MIPs were obtained to evaluate the vascular anatomy. Multidetector CT imaging of the abdomen and pelvis was performed using the standard protocol during bolus administration of intravenous contrast. CONTRAST:  33mL OMNIPAQUE IOHEXOL 350 MG/ML SOLN COMPARISON:  CT 06/20/2020, 06/22/2020 FINDINGS: CTA CHEST FINDINGS Cardiovascular: Satisfactory opacification of the pulmonary arteries to the segmental level. No evidence of pulmonary embolism. Thoracic aorta is normal in course and caliber. Atherosclerotic calcifications of the aorta and coronary arteries. Normal heart size. No pericardial effusion. Mediastinum/Nodes: Mildly prominent mediastinal lymph nodes are again noted, likely reactive. No axillary or hilar lymphadenopathy. Lungs/Pleura: Redemonstrated cavitary mass located within the inferomedial aspect of the right upper lobe measuring approximately 9.9 x 6.6 x 7.4 cm (previously measured approximately 7.8 x 4.5 x 5.5 cm on 08/22/2020. Increased surrounding consolidation and ground-glass opacity within the adjacent right upper lobe and right middle lobe previously seen cavitary lesion within the medial aspect of the left upper lobe has resolved with small thin walled pleural cyst remaining (series 6, image 44). Scattered areas of ground-glass opacity within the bilateral lower lobes and upper lobes. Background of moderate emphysema. No pleural effusion or pneumothorax. Musculoskeletal: Remote right sixth and seventh rib fractures. No new or acute osseous findings. No chest  wall abnormality. Review of the MIP images confirms the above findings. CT ABDOMEN and PELVIS FINDINGS Hepatobiliary: Cholelithiasis without evidence of pericholecystic inflammation by CT. Liver within normal limits. No biliary dilatation. Pancreas: Unremarkable. No pancreatic ductal dilatation or surrounding inflammatory changes. Spleen: Prior splenectomy. Adrenals/Urinary Tract: Unremarkable adrenal glands. Kidneys enhance symmetrically without focal lesion, stone, or hydronephrosis. Ureters are nondilated. Urinary bladder appears unremarkable. Stomach/Bowel: Stomach is within normal limits. Appendix appears normal (series 2, image 58). Incidental note of a small bowel-small bowel intussusception within the mid left abdomen (series 2, image 38). No evidence of bowel wall thickening, distention, or inflammatory changes. Vascular/Lymphatic: Fusiform infrarenal abdominal aortic aneurysm measuring up to 3.1 cm, unchanged. Aortoiliac atherosclerosis. No abdominopelvic lymphadenopathy. Reproductive: Prostate is unremarkable. Other: No free fluid. No abdominopelvic fluid collection. No pneumoperitoneum. Fat containing midline supraumbilical hernia without evidence of complication. Musculoskeletal: No acute or significant osseous findings. Review of the MIP images confirms the above findings. IMPRESSION: 1. No evidence of acute pulmonary embolism. 2. Enlarging cavitary mass within the inferomedial aspect of the right upper lobe measuring up to 9.9 cm (previously measured approximately 7.8 x 4.5 x 5.5 cm on 08/22/2020). Increased surrounding consolidation and ground-glass opacity within the adjacent right upper lobe and right middle lobe. Findings remain most compatible with cavitary/necrotizing pneumonia. 3. Scattered areas of ground-glass opacity within the bilateral lower lobes and upper lobes, concerning for multifocal infection. 4. Interval resolution of previously seen cavitary lesion within the medial aspect of the  left upper lobe with small thin walled pleural cyst remaining. 5. Cholelithiasis without evidence of pericholecystic inflammation by CT. 6. Incidental note of a small bowel-small bowel intussusception within the mid left abdomen. No evidence of bowel obstruction. 7. Fat  containing midline supraumbilical hernia without evidence of complication. 8. Stable infrarenal abdominal aortic aneurysm measuring up to 3.1 cm. Recommend follow-up ultrasound every 3 years. This recommendation follows ACR consensus guidelines: White Paper of the ACR Incidental Findings Committee II on Vascular Findings. J Am Coll Radiol 2013; 10:789-794. Aortic Atherosclerosis (ICD10-I70.0) and Emphysema (ICD10-J43.9). Electronically Signed   By: Davina Poke D.O.   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 There is increase in consolidation in this area compared to most recent study. Lungs elsewhere clear. Heart size and pulmonary vascular normal. No adenopathy appreciable. No bone lesions. IMPRESSION: Cavitary mass right middle lobe anteriorly with increase in surrounding consolidation compared to most recent study. The lungs elsewhere are clear. Heart size normal. No adenopathy appreciable by radiography. Electronically Signed   By: Lowella Grip III M.D.   On: 09/17/2020 13:54   DG Swallowing Func-Speech Pathology  Result Date: 09/06/2020 Please refer to "Notes" tab for Speech Pathology notes.     ASSESSMENT/PLAN   Presented in respiratory failure, non massive hemoptysis,  enlarging RUL density with crescent air sign. 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 Meds adjusted. On linezolid.The hemoptysis is from the infection (on going necrosis). His  neutropenia improved on Granix.  Heme involved. Candida in sputum ? Oral Colonization, relatively immunocompromised. Aspergillosis studies negative, fungitell negative. His hemoptysis is much improved today. Looks stronger.  -continue present antibiotic regimen -incentive spirometry -Since he is clinically looking better and the cxr is improving, will hold on bronch  relative contraindication doing a bronch in some one with a potential abscess) and t-surg consult.  -discussed with ID, to determine d/c antibiotics -pulm wise can go to snf with out patient follow up in pulmonary in 10-14 days.     Thank you for allowing me to participate in the care of this patient.   Patient/Family are satisfied with care plan and all questions have been answered.  This document was prepared using Dragon voice recognition software and may include unintentional dictation errors.     Wallene Huh, M.D.  Division of [place]

## 2020-09-28 LAB — CBC WITH DIFFERENTIAL/PLATELET
Abs Immature Granulocytes: 0.46 10*3/uL — ABNORMAL HIGH (ref 0.00–0.07)
Basophils Absolute: 0 10*3/uL (ref 0.0–0.1)
Basophils Relative: 1 %
Eosinophils Absolute: 0 10*3/uL (ref 0.0–0.5)
Eosinophils Relative: 1 %
HCT: 33.8 % — ABNORMAL LOW (ref 39.0–52.0)
Hemoglobin: 10.6 g/dL — ABNORMAL LOW (ref 13.0–17.0)
Immature Granulocytes: 13 %
Lymphocytes Relative: 31 %
Lymphs Abs: 1.1 10*3/uL (ref 0.7–4.0)
MCH: 27.4 pg (ref 26.0–34.0)
MCHC: 31.4 g/dL (ref 30.0–36.0)
MCV: 87.3 fL (ref 80.0–100.0)
Monocytes Absolute: 0.9 10*3/uL (ref 0.1–1.0)
Monocytes Relative: 26 %
Neutro Abs: 1 10*3/uL — ABNORMAL LOW (ref 1.7–7.7)
Neutrophils Relative %: 28 %
Platelets: 185 10*3/uL (ref 150–400)
RBC: 3.87 MIL/uL — ABNORMAL LOW (ref 4.22–5.81)
RDW: 21.4 % — ABNORMAL HIGH (ref 11.5–15.5)
Smear Review: NORMAL
WBC: 3.5 10*3/uL — ABNORMAL LOW (ref 4.0–10.5)
nRBC: 7.7 % — ABNORMAL HIGH (ref 0.0–0.2)

## 2020-09-28 MED ORDER — MUPIROCIN 2 % EX OINT
TOPICAL_OINTMENT | Freq: Two times a day (BID) | CUTANEOUS | 0 refills | Status: DC
Start: 2020-09-28 — End: 2020-12-19

## 2020-09-28 MED ORDER — BENZONATATE 200 MG PO CAPS: 200.0000 mg | ORAL_CAPSULE | Freq: Two times a day (BID) | ORAL | 0 refills | Status: AC | PRN

## 2020-09-28 MED ORDER — LINEZOLID 600 MG PO TABS: 600.0000 mg | ORAL_TABLET | Freq: Two times a day (BID) | ORAL | 0 refills | Status: DC

## 2020-09-28 MED ORDER — METRONIDAZOLE 500 MG PO TABS: 500.0000 mg | ORAL_TABLET | Freq: Two times a day (BID) | ORAL | 0 refills | Status: DC

## 2020-09-28 MED ORDER — TBO-FILGRASTIM 480 MCG/0.8ML ~~LOC~~ SOSY
480.0000 ug | PREFILLED_SYRINGE | Freq: Once | SUBCUTANEOUS | Status: AC
  Administered 2020-09-28: 15:00:00 480 ug via SUBCUTANEOUS
  Filled 2020-09-28: qty 0.8

## 2020-09-28 MED ORDER — ADULT MULTIVITAMIN W/MINERALS CH: 1.0000 | ORAL_TABLET | Freq: Every day | ORAL | 0 refills | Status: AC

## 2020-09-28 MED ORDER — GUAIFENESIN ER 600 MG PO TB12: 600.0000 mg | ORAL_TABLET | Freq: Two times a day (BID) | ORAL | 0 refills | Status: AC

## 2020-09-28 MED ORDER — NICOTINE 21 MG/24HR TD PT24: 21.0000 mg | MEDICATED_PATCH | Freq: Every day | TRANSDERMAL | 0 refills | Status: AC

## 2020-09-28 NOTE — TOC Progression Note (Signed)
Transition of Care [facility]) - Progression Note    Patient Details  Name: Marcus [name] MRN: [id number] Date of Birth: 04-16-1960  Transition of [facility] CM/SW [place], RN Phone Number: 09/28/2020, 10:01 AM  Clinical Narrative:   Patient ambulates on his own in his room, PT saw patient yesterday.  Patient states he does not need HH.  TOC to follow for needs    Expected Discharge Plan: Home/Self Care Barriers to Discharge: Continued Medical Work up  Expected Discharge Plan and Services Expected Discharge Plan: Home/Self Care   Discharge Planning Services: CM Consult Post Acute Care Choice: NA Living arrangements for the past 2 months: Single Family Home                                       Social Determinants of Health (SDOH) Interventions    Readmission Risk Interventions Readmission Risk Prevention Plan 09/20/2020 08/25/2020  Transportation Screening Complete Complete  PCP or Specialist Appt within 3-5 Days - Complete  HRI or [place] - Complete  Social Work Consult for [place] Planning/Counseling - Complete  Palliative Care Screening - Complete  Medication Review [employer]) Complete Complete  PCP or Specialist appointment within 3-5 days of discharge Complete -  [place] or Home Care Consult Complete -  Palliative Care Screening Not Applicable -  Portal Not Applicable -  Some recent data might be hidden

## 2020-09-28 NOTE — Progress Notes (Signed)
Pt with increased facial asymmetry right. Facial asymmetry only neurological change present. Spoke with pt. Discussed with pt that I noticed increased drooping on right side, this is a symptom of potential stroke and that I was going to notify Dr Kurtis Bushman. Pt grew highly stating "No you are not" Pt insisted he was fine and stating " pt stated "if you, the doctor, or anyone holds up my discharge I going to have to whoop somebodies ass".  I explained that MD needed to know so she could assess and do test to ensure pt was not having a stroke. He stated adamantly he did not want want "anything done" This RN notified MD of change of status and of patients wishes for no interventions.

## 2020-09-28 NOTE — Discharge Summary (Signed)
Marcus [name] QQP:[id number] DOB: [birth year] DOA: 09/17/2020  PCP: [name], FNP  Admit date: 09/17/2020 Discharge date: 09/28/2020  Admitted From: Home Disposition: Home  Recommendations for Outpatient Follow-up:  Follow up with PCP in 1 week Please obtain BMP/CBC in one week Please follow up with Dr. Raul Del in 1 week Please follow-up with Dr. Ramon Dredge, ID in 1 week Follow-up with Dr. Rogue Bussing oncology next week  Home Health:yes    Discharge Condition:Stable CODE STATUS: Full Diet recommendation: Heart Healthy  Brief/Interim Summary: Per ZTI:WPYK [name] is a 60 y.o. male COPD, chronic hypoxic respiratory failure on 2L [place] oxygen at home, Rheumatoid arthritis, Felty syndrome, pancytopenia, cavitary lung lesion, epigastric hernia, and tobacco use disorder who presents to the ED with coughing up blood, shortness of breath, cough, and abdominal pain. Patient reported the coughing of blood began past 2 to 3 days . He has had multiple episodes. In addition he has gradually become more short of breath.  Patient iwas also shaving coughing with congestion, patient reported shortness of breath with this.    CTA chest and CT of the abdomen and pelvis obtained, revealed his known right upper lobe cavitary mass increased to 9.9 cm in greatest diameter (previously was 7.8 on 08/22/20) with findings likely representing cavitary/necrotizing pneumonia; other findings included bilateral groundglass opacities concerning for multifocal infection, resolution of a different cavitary lesion that was in the left upper lobe, a small bowel intussusception within the mid left abdomen without evidence of obstruction, stable 3.1 cm infrarenal abdominal aortic aneurysm, and fat-containing supraumbilical hernia  Patient reported having visual symptoms and seizure like activity few weeks ago. Neurology was consulted.   Off Note:Recent History:  Admission 08/22/2020 through 08/30/2020 also for same presentation  for shortness of breath with 1 month of coughing; he was found to have a cavitary lesion in the right upper lobe by CT scan 08/22/2020 that was felt to be pneumonia and abscess.  The pneumonia was due to MRSA, which was found on sputum culture.  Patient was initially treated with IV Zosyn and IV vancomycin.  In consultation with infectious disease, the patient was transitioned to oral doxycycline with plan of 4-6 weeks of treatment.  During hospitalization he had relative adrenal insufficiency so his prednisone was doubled.  Patient has chronic neutropenia and had worsening neutropenia during his hospitalization; he was seen by the oncologist who felt the neutropenia was secondary to infection; he was given Granix.  Patient required initially nonrebreather oxygen and was eventually weaned to nasal cannula and eventually back to his home 2 L.  He was noted to be prednisone dependent; his home prednisone dose was changed from 10 mg daily to 10 mg twice a day.   Acute on chronic respiratory failure with hypoxia.  Better.  He is requiring from 2 to 3 L nasal cannula. Pulmonology was consulted and followed patient during hospitalization. Continue home inhalers  Necrotizing pneumonia causing sepsis.  MRSA positive and Candida positive. Right upper lobe is involved.  Also has 1 on the left upper lobe Clinically improved. Treated with IV antibiotics initially.  ID was consulted and was following patient.  Recommended patient to be discharged on linezolid and metronidazole x3 weeks  Resp cx with Candida dublisiensis.  Fungitell was negative -Diflucan was discontinued by ID thinking that it might be just colonization. Follow-up with PCCM in 1 week Follow-up with ID in 1 week and will need weekly labs while on antibiotics to be managed by ID Per PCCM since  he is clinically improving holding off bronchoscopy and CT surgery consult.      Neutropenia.  Noted during last hospitalization.   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  Resolved with replacement    Shakiness.  There was some witnessed shakiness, neurology was consulted for concern of seizure-like activity.  No shakiness or any other seizure noted.  EEG normal. Neurology signed off and they are recommending outpatient neurosurgery referral for C-spine stenosis   Chronic diastolic CHF Euvolemic   Intussusception This is an apparently incidental finding, no evidence of small bowel obstruction, belly exam benign Tolerating p.o. intake    Anemia of chronic disease Outpt f/u with hematology   Hyponatremia.  Resolved.    Smoking Cessation recommended, modalities discussed Nicotine patch given   Severe protein calorie malnutrition As evidenced by severe loss of subcutaneous muscle mass and fat, chronic infection, smoking    Discharge Diagnoses:  Principal Problem:   Acute on chronic respiratory failure with hypoxia (HCC) Active Problems:   Seropositive rheumatoid arthritis (HCC)   Chronic obstructive pulmonary disease (HCC)   Sepsis due to undetermined organism ([place])   Hypokalemia   Chronic diastolic CHF (congestive heart failure) (HCC)   Cavitary pneumonia   Acquired neutropenia (HCC)   Hemoptysis   Lung abscess ([place])   Protein-calorie malnutrition, severe    Discharge Instructions   Allergies as of 09/28/2020       Reactions   Levaquin [levofloxacin In D5w] Other (See Comments)   Chest pain   Methotrexate Other (See Comments)   Chest pain        Medication List     STOP taking these medications    doxycycline 100 MG  tablet Commonly known as: VIBRA-TABS   guaiFENesin-dextromethorphan 100-10 MG/5ML syrup Commonly known as: ROBITUSSIN DM       TAKE these medications    albuterol (2.5 MG/3ML) 0.083% nebulizer solution Commonly known as: PROVENTIL Take 3 mLs (2.5 mg total) by nebulization every 6 (six) hours as needed for wheezing or shortness of breath.   albuterol 108 (90 Base) MCG/ACT inhaler Commonly known as: VENTOLIN HFA Inhale 2 puffs into the lungs every 6 (six) hours as needed for wheezing or shortness of breath.   benzonatate 200 MG capsule Commonly known as: TESSALON Take 1 capsule (200 mg total) by mouth 2 (two) times daily as needed for up to 14 days for cough.   feeding supplement Liqd Take 237 mLs by mouth 2 (two) times daily between meals.   guaiFENesin 600 MG 12 hr tablet Commonly known as: MUCINEX Take 1 tablet (600 mg total) by mouth 2 (two) times daily for 14 days.   linezolid 600 MG tablet Commonly known as: Zyvox Take 1 tablet (600 mg total) by mouth 2 (two) times daily for 21 days.   metroNIDAZOLE 500 MG tablet Commonly known as: Flagyl Take 1 tablet (500 mg total) by mouth 2 (two) times daily for 21 days.   mometasone-formoterol 100-5 MCG/ACT Aero Commonly known as: DULERA Inhale 2 puffs into the lungs 2 (two) times daily.   multivitamin with minerals Tabs tablet Take 1 tablet by mouth daily. Start taking on: September 29, 2020  mupirocin ointment 2 % Commonly known as: BACTROBAN Place into the nose 2 (two) times daily.   nicotine 21 mg/24hr patch Commonly known as: NICODERM CQ - dosed in mg/24 hours Place 1 patch (21 mg total) onto the skin daily for 28 days. Start taking on: September 29, 2020   pantoprazole 40 MG tablet Commonly known as: PROTONIX Take 1 tablet (40 mg total) by mouth daily for 10 days.   predniSONE 10 MG tablet Commonly known as: DELTASONE Take 1 tablet (10 mg total) by mouth 2 (two) times daily with a meal.                Durable Medical Equipment  (From admission, onward)           Start     Ordered   09/28/20 1243  For home use only DME Walker rolling  Once       Comments: 3 in 1  Question Answer Comment  Walker: With [place]   Patient needs a walker to treat with the following condition Weakness      09/28/20 1243            Follow-up Information     Cammie Sickle, MD Follow up in 1 week(s).   Specialties: Internal Medicine, Oncology Contact information: [place] [address] [phone]         Tsosie Billing, MD Follow up.   Specialty: Infectious Diseases Contact information: [place] [address] [phone]         [name], FNP Follow up in 1 week(s).   Specialty: Family Medicine Contact information: [place] [address] [phone]         Erby Pian, MD Follow up in 1 week(s).   Specialty: Specialist Contact information: [place] [address] [phone]         Meade Maw, MD Follow up in 2 week(s).   Specialty: Neurosurgery Why: for cspine stenosis Contact information: [place] [address] [phone]                Allergies  Allergen Reactions   Levaquin [Levofloxacin In D5w] Other (See Comments)    Chest pain   Methotrexate Other (See Comments)    Chest pain    Consultations: Pccm, oncology , ID, neurology   Procedures/Studies: DG Chest 2 View  Result Date: 09/25/2020 CLINICAL DATA:  Cough.  Follow-up treatment. EXAM: CHEST - 2 VIEW COMPARISON:  CT 09/17/2020.  Chest x-ray 09/17/2020. FINDINGS: Mediastinum and hilar structures appears stable. Prominent cavitary infiltrate/mass is again noted in the anterior medial aspect of the right lung. Slight improvement from prior exams may be present. Mild adjacent infiltrate also again noted. Continued follow-up exam suggested to demonstrate  complete resolution. No scratched it tiny right pleural effusion cannot be excluded. No pneumothorax. Old right rib fractures again noted. Degenerative change thoracic spine. IMPRESSION: Prominent cavitary infiltrate/mass is again noted in the anteromedial aspect of the right lung. Slight improvement from prior exam may be present. Mild adjacent infiltrate also again noted. Continued follow-up exam suggested to demonstrate complete resolution. Electronically Signed   By: Marcello Moores  Register   On: 09/25/2020 11:48   DG Chest 2 View  Result Date: 09/06/2020 CLINICAL DATA:  Shortness of breath.  Cough. EXAM: CHEST - 2 VIEW COMPARISON:  Chest x-ray 08/28/2020.  CT 08/22/2020. FINDINGS: Mediastinum unremarkable. Heart size normal. Cavitary process in the right mid  lung again noted. No change identified. No pleural effusion or pneumothorax. Old right rib fractures. IMPRESSION: Cavitary process in the right mid lung again noted. Cavitary process best identified by prior CT. Chest is unchanged from prior exam. Electronically Signed   By: Marcello Moores  Register   On: 09/06/2020 14:04   CT Angio Chest PE W and/or Wo Contrast  Result Date: 09/17/2020 CLINICAL DATA:  Hemoptysis, shortness of breath, abdominal distension EXAM: CT ANGIOGRAPHY CHEST CT ABDOMEN AND PELVIS WITH CONTRAST TECHNIQUE: Multidetector CT imaging of the chest was performed using the standard protocol during bolus administration of intravenous contrast. Multiplanar CT image reconstructions and MIPs were obtained to evaluate the vascular anatomy. Multidetector CT imaging of the abdomen and pelvis was performed using the standard protocol during bolus administration of intravenous contrast. CONTRAST:  55m OMNIPAQUE IOHEXOL 350 MG/ML SOLN COMPARISON:  CT 06/20/2020, 06/22/2020 FINDINGS: CTA CHEST FINDINGS Cardiovascular: Satisfactory opacification of the pulmonary arteries to the segmental level. No evidence of pulmonary embolism. Thoracic aorta is normal in  course and caliber. Atherosclerotic calcifications of the aorta and coronary arteries. Normal heart size. No pericardial effusion. Mediastinum/Nodes: Mildly prominent mediastinal lymph nodes are again noted, likely reactive. No axillary or hilar lymphadenopathy. Lungs/Pleura: Redemonstrated cavitary mass located within the inferomedial aspect of the right upper lobe measuring approximately 9.9 x 6.6 x 7.4 cm (previously measured approximately 7.8 x 4.5 x 5.5 cm on 08/22/2020. Increased surrounding consolidation and ground-glass opacity within the adjacent right upper lobe and right middle lobe previously seen cavitary lesion within the medial aspect of the left upper lobe has resolved with small thin walled pleural cyst remaining (series 6, image 44). Scattered areas of ground-glass opacity within the bilateral lower lobes and upper lobes. Background of moderate emphysema. No pleural effusion or pneumothorax. Musculoskeletal: Remote right sixth and seventh rib fractures. No new or acute osseous findings. No chest wall abnormality. Review of the MIP images confirms the above findings. CT ABDOMEN and PELVIS FINDINGS Hepatobiliary: Cholelithiasis without evidence of pericholecystic inflammation by CT. Liver within normal limits. No biliary dilatation. Pancreas: Unremarkable. No pancreatic ductal dilatation or surrounding inflammatory changes. Spleen: Prior splenectomy. Adrenals/Urinary Tract: Unremarkable adrenal glands. Kidneys enhance symmetrically without focal lesion, stone, or hydronephrosis. Ureters are nondilated. Urinary bladder appears unremarkable. Stomach/Bowel: Stomach is within normal limits. Appendix appears normal (series 2, image 58). Incidental note of a small bowel-small bowel intussusception within the mid left abdomen (series 2, image 38). No evidence of bowel wall thickening, distention, or inflammatory changes. Vascular/Lymphatic: Fusiform infrarenal abdominal aortic aneurysm measuring up to 3.1  cm, unchanged. Aortoiliac atherosclerosis. No abdominopelvic lymphadenopathy. Reproductive: Prostate is unremarkable. Other: No free fluid. No abdominopelvic fluid collection. No pneumoperitoneum. Fat containing midline supraumbilical hernia without evidence of complication. Musculoskeletal: No acute or significant osseous findings. Review of the MIP images confirms the above findings. IMPRESSION: 1. No evidence of acute pulmonary embolism. 2. Enlarging cavitary mass within the inferomedial aspect of the right upper lobe measuring up to 9.9 cm (previously measured approximately 7.8 x 4.5 x 5.5 cm on 08/22/2020). Increased surrounding consolidation and ground-glass opacity within the adjacent right upper lobe and right middle lobe. Findings remain most compatible with cavitary/necrotizing pneumonia. 3. Scattered areas of ground-glass opacity within the bilateral lower lobes and upper lobes, concerning for multifocal infection. 4. Interval resolution of previously seen cavitary lesion within the medial aspect of the left upper lobe with small thin walled pleural cyst remaining. 5. Cholelithiasis without evidence of pericholecystic inflammation by CT. 6. Incidental note of a  small bowel-small bowel intussusception within the mid left abdomen. No evidence of bowel obstruction. 7. Fat containing midline supraumbilical hernia without evidence of complication. 8. Stable infrarenal abdominal aortic aneurysm measuring up to 3.1 cm. Recommend follow-up ultrasound every 3 years. This recommendation follows ACR consensus guidelines: White Paper of the ACR Incidental Findings Committee II on Vascular Findings. J Am Coll Radiol 2013; 10:789-794. Aortic Atherosclerosis (ICD10-I70.0) and Emphysema (ICD10-J43.9). Electronically Signed   By: Davina Poke D.O.   On: 09/17/2020 16:38   MR BRAIN W WO CONTRAST  Result Date: 09/18/2020 CLINICAL DATA:  Brain mass or lesion. Additional history provided: 59 year old male with history  of COPD, Felty syndrome and rheumatoid arthritis. Recent MRSA pneumonia. Patient reports visual symptoms and seizure-like activity a few weeks ago. EXAM: MRI HEAD WITHOUT AND WITH CONTRAST TECHNIQUE: Multiplanar, multiecho pulse sequences of the brain and surrounding structures were obtained without and with intravenous contrast. CONTRAST:  41m GADAVIST GADOBUTROL 1 MMOL/ML IV SOLN COMPARISON:  No pertinent prior exams available for comparison. FINDINGS: Brain: Intermittently motion degraded examination, limiting evaluation. Most notably, there is moderate/severe motion degradation of the sagittal T1 weighted sequence, moderate/severe motion degradation of the axial T2/FLAIR sequence, moderate motion degradation of the axial T1 weighted postcontrast sequence and moderate motion degradation of the coronal T1 weighted postcontrast sequence. Mild generalized cerebral and cerebellar atrophy. Advanced patchy and confluent T2/FLAIR hyperintensity within the cerebral white matter, nonspecific but most often secondary to chronic small vessel ischemia. Chronic lacunar infarct within the left lentiform nucleus. Small foci of SWI signal loss within the left basal ganglia and along the margin of the posterior right lateral ventricle, likely reflecting chronic microhemorrhages. Additional subcentimeter focus of SWI signal loss within the midline cerebellum, which may reflect an additional chronic microhemorrhage or cavernoma. There is no acute infarct. No evidence of intracranial mass. No extra-axial fluid collection. No midline shift. Within the limitations of motion degradation, no abnormal intracranial enhancement is identified. Vascular: Expected proximal arterial flow voids. Non dominant intracranial right vertebral artery. Skull and upper cervical spine: Within the limitations of motion degradation, no focal marrow lesion is identified. Sinuses/Orbits: Visualized orbits show no acute finding. 1.9 cm right maxillary sinus  mucous retention cyst. Trace left maxillary sinus mucosal thickening. Other: Trace bilateral mastoid effusions. 2.8 cm ovoid lesion within the left suboccipital scalp demonstrating heterogeneous but predominantly T2 hyperintense signal, T1 hypointense signal, restricted diffusion and no convincing abnormal enhancement. This is favored to reflect a sebaceous/epidermoid cyst. IMPRESSION: Motion degraded examination, as described and limiting evaluation. No evidence of acute intracranial abnormality. Severe patchy and confluent T2/FLAIR hyperintensity within the cerebral white matter, nonspecific but most often secondary to chronic small vessel ischemia. Chronic left basal ganglia lacunar infarct. Subcentimeter focus of SWI signal loss within the midline cerebellum, which may reflect a chronic microhemorrhage or cavernoma. Mild generalized parenchymal atrophy. Incidentally noted 1.9 cm right maxillary sinus mucous retention cyst. Trace bilateral mastoid effusions. Electronically Signed   By: KKellie SimmeringDO   On: 09/18/2020 15:59   MR CERVICAL SPINE W WO CONTRAST  Result Date: 09/19/2020 CLINICAL DATA:  Acute or progressive myelopathy. EXAM: MRI CERVICAL SPINE WITHOUT AND WITH CONTRAST TECHNIQUE: Multiplanar and multiecho pulse sequences of the cervical spine, to include the craniocervical junction and cervicothoracic junction, were obtained without and with intravenous contrast. CONTRAST:  646mGADAVIST GADOBUTROL 1 MMOL/ML IV SOLN COMPARISON:  No previous cervical imaging. FINDINGS: Alignment: Straightening of the normal cervical lordosis. 1 mm degenerative anterolisthesis C7-T1. Vertebrae: No fracture or  focal bone lesion. Cord: No primary cord lesion.  See below regarding stenosis. Posterior Fossa, vertebral arteries, paraspinal tissues: Large sebaceous cyst of the upper left neck. Disc levels: Foramen magnum is widely patent.  C1-2 and C2-3 are normal. C3-4: Endplate osteophytes and bulging of the disc. Canal  stenosis with AP diameter in the midline 6 mm. Effacement of the subarachnoid space and slight indentation of the cord. Moderate bilateral foraminal narrowing. C4-5: Endplate osteophytes and protrusion of the disc more prominent towards the left. Effacement of the subarachnoid space and cord deformity, worse on the left. AP diameter of the canal 5.9 mm. Bilateral foraminal stenosis. C5-6: Spondylosis with endplate osteophytes and bulging of the disc. Canal narrowing with AP diameter in the midline 8.3 mm. No compressive effect upon the cord. Bilateral foraminal narrowing. C6-7: Mild bulging of the disc. No canal stenosis. Foraminal narrowing on the right that could affect the exiting C7 nerve. C7-T1: Facet osteoarthritis with 1 mm of anterolisthesis. No stenosis of the canal or foramina. 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 AP diameter of the canal at C3-4 6 mm. AP diameter of the spinal canal at C4-5 5.9 mm. Effacement of the subarachnoid space with some indentation of the cord. No abnormal cord T2 signal however. Foraminal stenosis that could cause neural compression on either side at C3-4, either side at C4-5, either side at C5-6 and on the right C6-7. Electronically Signed   By: Nelson Chimes M.D.   On: 09/19/2020 18:01   MR THORACIC SPINE W WO CONTRAST  Result Date: 09/19/2020 CLINICAL DATA:  Acute or progressive myelopathy. EXAM: MRI THORACIC WITHOUT AND WITH CONTRAST TECHNIQUE: Multiplanar and multiecho pulse sequences of the thoracic spine were obtained without and with intravenous contrast. CONTRAST:  41m GADAVIST GADOBUTROL 1 MMOL/ML IV SOLN COMPARISON:  Cervical study same day FINDINGS: Alignment:  Normal Vertebrae: No fracture or focal bone lesion. Cord: No cord compression or primary cord lesion. Spinal canal widely patent throughout the region. No evidence of arteriovenous malformation. Paraspinal and other soft tissues: Negative Disc levels: No significant  disc pathology in the thoracic region. Wide patency of the canal and foramina. Ordinary mild facet osteoarthritis without edema or enhancement. No encroachment upon the neural spaces. IMPRESSION: Negative thoracic study. No cord compression or cord lesion. No significant degenerative changes. Electronically Signed   By: MNelson ChimesM.D.   On: 09/19/2020 18:03   CT ABDOMEN PELVIS W CONTRAST  Result Date: 09/17/2020 CLINICAL DATA:  Hemoptysis, shortness of breath, abdominal distension EXAM: CT ANGIOGRAPHY CHEST CT ABDOMEN AND PELVIS WITH CONTRAST TECHNIQUE: Multidetector CT imaging of the chest was performed using the standard protocol during bolus administration of intravenous contrast. Multiplanar CT image reconstructions and MIPs were obtained to evaluate the vascular anatomy. Multidetector CT imaging of the abdomen and pelvis was performed using the standard protocol during bolus administration of intravenous contrast. CONTRAST:  749mOMNIPAQUE IOHEXOL 350 MG/ML SOLN COMPARISON:  CT 06/20/2020, 06/22/2020 FINDINGS: CTA CHEST FINDINGS Cardiovascular: Satisfactory opacification of the pulmonary arteries to the segmental level. No evidence of pulmonary embolism. Thoracic aorta is normal in course and caliber. Atherosclerotic calcifications of the aorta and coronary arteries. Normal heart size. No pericardial effusion. Mediastinum/Nodes: Mildly prominent mediastinal lymph nodes are again noted, likely reactive. No axillary or hilar lymphadenopathy. Lungs/Pleura: Redemonstrated cavitary mass located within the inferomedial aspect of the right upper lobe measuring approximately 9.9 x 6.6 x 7.4 cm (previously measured approximately 7.8 x 4.5 x 5.5 cm on 08/22/2020.  Increased surrounding consolidation and ground-glass opacity within the adjacent right upper lobe and right middle lobe previously seen cavitary lesion within the medial aspect of the left upper lobe has resolved with small thin walled pleural cyst  remaining (series 6, image 44). Scattered areas of ground-glass opacity within the bilateral lower lobes and upper lobes. Background of moderate emphysema. No pleural effusion or pneumothorax. Musculoskeletal: Remote right sixth and seventh rib fractures. No new or acute osseous findings. No chest wall abnormality. Review of the MIP images confirms the above findings. CT ABDOMEN and PELVIS FINDINGS Hepatobiliary: Cholelithiasis without evidence of pericholecystic inflammation by CT. Liver within normal limits. No biliary dilatation. Pancreas: Unremarkable. No pancreatic ductal dilatation or surrounding inflammatory changes. Spleen: Prior splenectomy. Adrenals/Urinary Tract: Unremarkable adrenal glands. Kidneys enhance symmetrically without focal lesion, stone, or hydronephrosis. Ureters are nondilated. Urinary bladder appears unremarkable. Stomach/Bowel: Stomach is within normal limits. Appendix appears normal (series 2, image 58). Incidental note of a small bowel-small bowel intussusception within the mid left abdomen (series 2, image 38). No evidence of bowel wall thickening, distention, or inflammatory changes. Vascular/Lymphatic: Fusiform infrarenal abdominal aortic aneurysm measuring up to 3.1 cm, unchanged. Aortoiliac atherosclerosis. No abdominopelvic lymphadenopathy. Reproductive: Prostate is unremarkable. Other: No free fluid. No abdominopelvic fluid collection. No pneumoperitoneum. Fat containing midline supraumbilical hernia without evidence of complication. Musculoskeletal: No acute or significant osseous findings. Review of the MIP images confirms the above findings. IMPRESSION: 1. No evidence of acute pulmonary embolism. 2. Enlarging cavitary mass within the inferomedial aspect of the right upper lobe measuring up to 9.9 cm (previously measured approximately 7.8 x 4.5 x 5.5 cm on 08/22/2020). Increased surrounding consolidation and ground-glass opacity within the adjacent right upper lobe and right  middle lobe. Findings remain most compatible with cavitary/necrotizing pneumonia. 3. Scattered areas of ground-glass opacity within the bilateral lower lobes and upper lobes, concerning for multifocal infection. 4. Interval resolution of previously seen cavitary lesion within the medial aspect of the left upper lobe with small thin walled pleural cyst remaining. 5. Cholelithiasis without evidence of pericholecystic inflammation by CT. 6. Incidental note of a small bowel-small bowel intussusception within the mid left abdomen. No evidence of bowel obstruction. 7. Fat containing midline supraumbilical hernia without evidence of complication. 8. Stable infrarenal abdominal aortic aneurysm measuring up to 3.1 cm. Recommend follow-up ultrasound every 3 years. This recommendation follows ACR consensus guidelines: White Paper of the ACR Incidental Findings Committee II on Vascular Findings. J Am Coll Radiol 2013; 10:789-794. Aortic Atherosclerosis (ICD10-I70.0) and Emphysema (ICD10-J43.9). Electronically Signed   By: Davina Poke D.O.   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 There is increase in consolidation in this area compared to most recent study. Lungs elsewhere clear. Heart size and pulmonary vascular normal. No adenopathy appreciable. No bone lesions. IMPRESSION: Cavitary mass right middle lobe anteriorly with increase in surrounding consolidation compared to most recent study. The lungs elsewhere are clear. Heart size normal. No adenopathy appreciable by radiography. Electronically Signed   By: Lowella Grip III M.D.   On: 09/17/2020 13:54      Subjective: Feels his shortness of breath is better.  PT recommended SNF but  patient declined.  No further hemoptysis per patient.  Discharge Exam:  Vitals:   09/28/20 0727 09/28/20 1211  BP: 105/68 102/64  Pulse: 83 (!) 107  Resp: 18 16  Temp: 97.7 F (36.5 C) (!) 97.4 F (36.3 C)  SpO2: 97% 94%   Vitals:   09/27/20 1552 09/27/20 2239 09/28/20 0727 09/28/20 1211  BP: 100/61 105/63 105/68 102/64  Pulse: 98 98 83 (!) 107  Resp: _0 Temp: 97.8 F (36.6 C) 98 F (36.7 C) 97.7 F (36.5 C) (!) 97.4 F (36.3 C)  TempSrc:    Oral  SpO2: 97% 97% 97% 94%  Weight:      Height:        General: Pt is alert, awake, not in acute distress Cardiovascular: RRR, S1/S2 +, no rubs, no gallops Respiratory: decrease bs, no rhonchi Abdominal: Soft, NT, ND, bowel sounds + Extremities: no edema Neurology: no facial droop. [email or web address]    The results of significant diagnostics from this hospitalization (including imaging, microbiology, ancillary and laboratory) are listed below for reference.     Microbiology: Recent Results (from the past 240 hour(s))  Aspergillus Ag, BAL/Serum     Status: None   Collection Time: 09/19/20  5:05 AM   Specimen: Vein; Blood  Result Value Ref Range Status   Aspergillus Ag, BAL/Serum 0.02 0.00 - 0.49 Index Final    Comment: (NOTE) Performed At: [facility] [place], [place] [id number] Rush Farmer MD [id number]   Expectorated Sputum Assessment w Gram Stain, Rflx to Resp Cult     Status: None   Collection Time: 09/20/20  5:33 AM   Specimen: Expectorated Sputum  Result Value Ref Range Status   Specimen Description EXPECTORATED SPUTUM  Final   Special Requests NONE  Final   Sputum evaluation   Final    THIS SPECIMEN IS ACCEPTABLE FOR SPUTUM CULTURE Performed at [facility], [address], [place], [place] [address]    Report Status 09/20/2020 FINAL  Final  Culture, Respiratory w Gram Stain     Status: None   Collection Time: 09/20/20  5:33 AM  Result Value Ref Range Status   Specimen  Description   Final    EXPECTORATED SPUTUM Performed at [facility], [address], [place], [place] [address]    Special Requests   Final    NONE Reflexed from [id number] Performed at [facility], [address] [place], [place] [address]    Gram Stain   Final    ABUNDANT WBC PRESENT,BOTH PMN AND MONONUCLEAR FEW GRAM POSITIVE COCCI IN PAIRS IN CLUSTERS Performed at [facility], [place] [address], [place], [place] [address]    Culture   Final    ABUNDANT METHICILLIN RESISTANT STAPHYLOCOCCUS AUREUS   Report Status 09/23/2020 FINAL  Final   Organism ID, Bacteria METHICILLIN RESISTANT STAPHYLOCOCCUS AUREUS  Final      Susceptibility   Methicillin resistant staphylococcus aureus - MIC*    CIPROFLOXACIN <=0.5 SENSITIVE Sensitive     ERYTHROMYCIN >=8 RESISTANT Resistant     GENTAMICIN <=0.5 SENSITIVE Sensitive     OXACILLIN >=4 RESISTANT Resistant     TETRACYCLINE <=1 SENSITIVE Sensitive     VANCOMYCIN <=0.5 SENSITIVE Sensitive     TRIMETH/SULFA <=10 SENSITIVE Sensitive     CLINDAMYCIN <=0.25 SENSITIVE Sensitive     RIFAMPIN <=0.5 SENSITIVE Sensitive     Inducible Clindamycin NEGATIVE Sensitive     * ABUNDANT METHICILLIN RESISTANT STAPHYLOCOCCUS AUREUS  Acid Fast Smear (AFB)     Status: None  Collection Time: 09/20/20  5:33 AM   Specimen: Sputum  Result Value Ref Range Status   AFB Specimen Processing Concentration  Final   Acid Fast Smear Negative  Final    Comment: (NOTE) Performed At: [facility] [place], [place] [id number] Rush Farmer MD [id number]    Source (AFB) EXPECTORATED SPUTUM  Final    Comment: Performed at [facility], [address] [place], [place] [address]  Anaerobic culture w Gram Stain     Status: None   Collection Time: 09/20/20  5:33 AM   Specimen: Expectorated Sputum  Result Value Ref Range Status   Specimen Description   Final    EXPECTORATED SPUTUM Performed at [facility],  [address] [place], [place] [address]    Special Requests   Final    Normal Performed at [facility], [place], [place] [address]    Gram Stain   Final    RARE WBC PRESENT,BOTH PMN AND MONONUCLEAR RARE GRAM POSITIVE COCCI IN PAIRS IN CLUSTERS    Culture   Final    RARE PREVOTELLA NANCEIENSIS BETA LACTAMASE POSITIVE Performed at [facility], [place] [address], [place], [place] [address]    Report Status 09/24/2020 FINAL  Final  Culture, fungus without smear     Status: Abnormal (Preliminary result)   Collection Time: 09/20/20  5:33 AM   Specimen: Expectorated Sputum  Result Value Ref Range Status   Specimen Description   Final    EXPECTORATED SPUTUM Performed at [facility], [address], [place], [place] [address]    Special Requests   Final    Normal Performed at [facility], [address], [place], [place] [address]    Culture (A)  Final    CANDIDA DUBLINIENSIS CONTINUING TO HOLD Performed at [facility], [place] [address], [place], [place] [address]    Report Status PENDING  Incomplete  Expectorated Sputum Assessment w Gram Stain, Rflx to Resp Cult     Status: None   Collection Time: 09/20/20  9:13 AM   Specimen: Sputum  Result Value Ref Range Status   Specimen Description SPUTUM  Final   Special Requests Normal  Final   Sputum evaluation   Final    THIS SPECIMEN IS ACCEPTABLE FOR SPUTUM CULTURE Performed at [facility], [address], [place], [place] [address]    Report Status 09/20/2020 FINAL  Final  Culture, Respiratory w Gram Stain     Status: None   Collection Time: 09/20/20  9:13 AM   Specimen: SPU  Result Value Ref Range Status   Specimen Description   Final    SPUTUM Performed at [facility], [address] [place], [place] [address]    Special Requests   Final    Normal Reflexed from [id number] Performed at [facility], [address] [place],  [place] [address]    Gram Stain   Final    RARE WBC PRESENT,BOTH PMN AND MONONUCLEAR RARE GRAM POSITIVE COCCI IN PAIRS IN CLUSTERS    Culture   Final    FEW STAPHYLOCOCCUS AUREUS SUSCEPTIBILITIES PERFORMED ON PREVIOUS CULTURE WITHIN THE LAST 5 DAYS. Performed at [facility], [place] [address] [place], [place] [address]    Report Status 09/23/2020 FINAL  Final  Aspergillus Ag, BAL/Serum     Status: None   Collection Time: 09/25/20 10:54 AM  Result Value Ref Range Status   Aspergillus  Ag, BAL/Serum 0.04 0.00 - 0.49 Index Final    Comment: (NOTE) Performed At: [facility] [place], [place] [id number] Rush Farmer MD ZT:[id number]      Labs: BNP (last 3 results) Recent Labs    09/06/20 1319 09/17/20 1345  BNP 104.8* 82.5   Basic Metabolic Panel: Recent Labs  Lab 09/23/20 0528 09/24/20 0702 09/25/20 0416 09/27/20 0403  NA 134* 135  --  134*  K 4.5 5.0  --  4.9  CL 93* 94*  --  92*  CO2 38* 35*  --  34*  GLUCOSE 117* 123*  --  106*  BUN 16 23*  --  28*  CREATININE 0.46* 0.56*  --  0.39*  CALCIUM 8.3* 8.3*  --  8.4*  MG  --   --  2.2  --    Liver Function Tests: No results for input(s): AST, ALT, ALKPHOS, BILITOT, PROT, ALBUMIN in the last 168 hours. No results for input(s): LIPASE, AMYLASE in the last 168 hours. No results for input(s): AMMONIA in the last 168 hours. CBC: Recent Labs  Lab 09/24/20 0702 09/25/20 0416 09/26/20 0425 09/27/20 0403 09/28/20 0518  WBC 4.1 2.7* 10.6* 6.4 3.5*  NEUTROABS 1.6* 0.6* 5.7 2.8 1.0*  HGB 11.5* 10.8* 10.5* 10.4* 10.6*  HCT 36.9* 35.4* 33.2* 32.9* 33.8*  MCV 87.6 89.2 87.4 88.0 87.3  PLT 227 221 199 182 185   Cardiac Enzymes: No results for input(s): CKTOTAL, CKMB, CKMBINDEX, TROPONINI in the last 168 hours. BNP: Invalid input(s): POCBNP CBG: No results for input(s): GLUCAP in the last 168 hours. D-Dimer No results for input(s): DDIMER in the last 72 hours. Hgb A1c No results for input(s):  HGBA1C in the last 72 hours. Lipid Profile No results for input(s): CHOL, HDL, LDLCALC, TRIG, CHOLHDL, LDLDIRECT in the last 72 hours. Thyroid function studies No results for input(s): TSH, T4TOTAL, T3FREE, THYROIDAB in the last 72 hours.  Invalid input(s): FREET3 Anemia work up No results for input(s): VITAMINB12, FOLATE, FERRITIN, TIBC, IRON, RETICCTPCT in the last 72 hours. Urinalysis    Component Value Date/Time   COLORURINE AMBER (A) 08/22/2020 1253   APPEARANCEUR HAZY (A) 08/22/2020 1253   LABSPEC 1.024 08/22/2020 1253   PHURINE 5.0 08/22/2020 [place] 08/22/2020 1253   HGBUR NEGATIVE 08/22/2020 [place] 08/22/2020 1253   KETONESUR NEGATIVE 08/22/2020 1253   PROTEINUR NEGATIVE 08/22/2020 1253   UROBILINOGEN 0.2 10/21/2012 1737   NITRITE NEGATIVE 08/22/2020 1253   LEUKOCYTESUR NEGATIVE 08/22/2020 1253   Sepsis Labs Invalid input(s): PROCALCITONIN,  WBC,  LACTICIDVEN Microbiology Recent Results (from the past 240 hour(s))  Aspergillus Ag, BAL/Serum     Status: None   Collection Time: 09/19/20  5:05 AM   Specimen: Vein; Blood  Result Value Ref Range Status   Aspergillus Ag, BAL/Serum 0.02 0.00 - 0.49 Index Final    Comment: (NOTE) Performed At: [facility] [employer] [address] [place], [place] [id number] Rush Farmer MD [id number]   Expectorated Sputum Assessment w Gram Stain, Rflx to Resp Cult     Status: None   Collection Time: 09/20/20  5:33 AM   Specimen: Expectorated Sputum  Result Value Ref Range Status   Specimen Description EXPECTORATED SPUTUM  Final   Special Requests NONE  Final   Sputum evaluation   Final    THIS SPECIMEN IS ACCEPTABLE FOR SPUTUM CULTURE Performed at [facility], [address], [place], [place] [address]    Report Status 09/20/2020  FINAL  Final  Culture, Respiratory w Gram Stain     Status: None   Collection Time: 09/20/20  5:33 AM  Result Value Ref Range Status   Specimen  Description   Final    EXPECTORATED SPUTUM Performed at [facility], [address] [place], [place] [address]    Special Requests   Final    NONE Reflexed from [id number] Performed at [facility], [address] [place], [place] [address]    Gram Stain   Final    ABUNDANT WBC PRESENT,BOTH PMN AND MONONUCLEAR FEW GRAM POSITIVE COCCI IN PAIRS IN CLUSTERS Performed at [facility], [place] [address] [place], [place] [address]    Culture   Final    ABUNDANT METHICILLIN RESISTANT STAPHYLOCOCCUS AUREUS   Report Status 09/23/2020 FINAL  Final   Organism ID, Bacteria METHICILLIN RESISTANT STAPHYLOCOCCUS AUREUS  Final      Susceptibility   Methicillin resistant staphylococcus aureus - MIC*    CIPROFLOXACIN <=0.5 SENSITIVE Sensitive     ERYTHROMYCIN >=8 RESISTANT Resistant     GENTAMICIN <=0.5 SENSITIVE Sensitive     OXACILLIN >=4 RESISTANT Resistant     TETRACYCLINE <=1 SENSITIVE Sensitive     VANCOMYCIN <=0.5 SENSITIVE Sensitive     TRIMETH/SULFA <=10 SENSITIVE Sensitive     CLINDAMYCIN <=0.25 SENSITIVE Sensitive     RIFAMPIN <=0.5 SENSITIVE Sensitive     Inducible Clindamycin NEGATIVE Sensitive     * ABUNDANT METHICILLIN RESISTANT STAPHYLOCOCCUS AUREUS  Acid Fast Smear (AFB)     Status: None   Collection Time: 09/20/20  5:33 AM   Specimen: Sputum  Result Value Ref Range Status   AFB Specimen Processing Concentration  Final   Acid Fast Smear Negative  Final    Comment: (NOTE) Performed At: [facility] [address] [place], [place] [id number] Rush Farmer MD [id number]:[id number]    Source (AFB) EXPECTORATED SPUTUM  Final    Comment: Performed at [facility], [address] [place], [place] [address]  Anaerobic culture w Gram Stain     Status: None   Collection Time: 09/20/20  5:33 AM   Specimen: Expectorated Sputum  Result Value Ref Range Status   Specimen Description   Final    EXPECTORATED SPUTUM Performed at [facility] [place] [facility],  [address], [place], [place] [address]    Special Requests   Final    Normal Performed at [facility], [address] [place], [place] [address]    Gram Stain   Final    RARE WBC PRESENT,BOTH PMN AND MONONUCLEAR RARE GRAM POSITIVE COCCI IN PAIRS IN CLUSTERS    Culture   Final    RARE PREVOTELLA NANCEIENSIS BETA LACTAMASE POSITIVE Performed at [facility], [place] [address] [place], [place] [address]    Report Status 09/24/2020 FINAL  Final  Culture, fungus without smear     Status: Abnormal (Preliminary result)   Collection Time: 09/20/20  5:33 AM   Specimen: Expectorated Sputum  Result Value Ref Range Status   Specimen Description   Final    EXPECTORATED SPUTUM Performed at [facility], [address], [place], [place] [address]    Special Requests   Final    Normal Performed at [facility], [address] [place], [place] [address]    Culture (A)  Final    CANDIDA DUBLINIENSIS CONTINUING TO HOLD Performed at [facility], [place] [address], [place], [place] [address]  Report Status PENDING  Incomplete  Expectorated Sputum Assessment w Gram Stain, Rflx to Resp Cult     Status: None   Collection Time: 09/20/20  9:13 AM   Specimen: Sputum  Result Value Ref Range Status   Specimen Description SPUTUM  Final   Special Requests Normal  Final   Sputum evaluation   Final    THIS SPECIMEN IS ACCEPTABLE FOR SPUTUM CULTURE Performed at [facility], [address], [place], [place] [address]    Report Status 09/20/2020 FINAL  Final  Culture, Respiratory w Gram Stain     Status: None   Collection Time: 09/20/20  9:13 AM   Specimen: SPU  Result Value Ref Range Status   Specimen Description   Final    SPUTUM Performed at [facility], [address], [place], [place] [address]    Special Requests   Final    Normal Reflexed from [id number] Performed at [facility], [place] [place],  [place] [address]    Gram Stain   Final    RARE WBC PRESENT,BOTH PMN AND MONONUCLEAR RARE GRAM POSITIVE COCCI IN PAIRS IN CLUSTERS    Culture   Final    FEW STAPHYLOCOCCUS AUREUS SUSCEPTIBILITIES PERFORMED ON PREVIOUS CULTURE WITHIN THE LAST 5 DAYS. Performed at [facility], [place] [address], [place], [place] [address]    Report Status 09/23/2020 FINAL  Final  Aspergillus Ag, BAL/Serum     Status: None   Collection Time: 09/25/20 10:54 AM  Result Value Ref Range Status   Aspergillus Ag, BAL/Serum 0.04 0.00 - 0.49 Index Final    Comment: (NOTE) Performed At: [facility] [place], [place] [id number] Rush Farmer MD YN:[id number]      Time coordinating discharge: Over 30 minutes  SIGNED:   Nolberto Hanlon, MD  [facility] 09/28/2020, 1:42 PM Pager   If 7PM-7AM, please contact night-coverage [email or web address] Password [email or web address]

## 2020-09-28 NOTE — Progress Notes (Signed)
Antimicrobial Stewardship - brief note  Patient to discharge on linezolid (and metronidazole).  Confirmed copay for linezolid as $3 with our pharmacy tech patient advocate .  Discussed with patient food interactions and monintoring.  [facility] [address] [facility] sufficient quantity of linezolid in [facility].    Doreene Eland, PharmD, BCPS.   Work Cell: [phone] 09/28/2020 8:47 AM

## 2020-09-28 NOTE — TOC Progression Note (Signed)
Transition of Care [facility]) - Progression Note    Patient Details  Name: Marcus [name] MRN: [id number] Date of Birth: 03-Jun-1960  Transition of Care [facility]) CM/SW [place], RN Phone Number: 09/28/2020, 3:36 PM  Clinical Narrative:   Patient is discharged with orders for home oxygen.  Patient states his brother cannot go to his house to get his oxygen.  [employer] is patient's oxygen supplier, delivered oxygen for patient's ride home.  Patient continues to state he will not require SNF or HH and refuses these service.    Expected Discharge Plan: Home/Self Care Barriers to Discharge: Continued Medical Work up  Expected Discharge Plan and Services Expected Discharge Plan: Home/Self Care   Discharge Planning Services: CM Consult Post Acute Care Choice: NA Living arrangements for the past 2 months: Single Family Home                                       Social Determinants of Health (SDOH) Interventions    Readmission Risk Interventions Readmission Risk Prevention Plan 09/20/2020 08/25/2020  Transportation Screening Complete Complete  PCP or Specialist Appt within 3-5 Days - Complete  HRI or [place] - Complete  Social Work Consult for [place] Planning/Counseling - Complete  Palliative Care Screening - Complete  Medication Review Press photographer) Complete Complete  PCP or Specialist appointment within 3-5 days of discharge Complete -  [place] or Home Care Consult Complete -  Palliative Care Screening Not Applicable -  [place] Not Applicable -  Some recent data might be hidden

## 2020-09-30 ENCOUNTER — Emergency Department: Payer: Medicaid Other

## 2020-09-30 ENCOUNTER — Emergency Department
Admission: EM | Admit: 2020-09-30 | Discharge: 2020-09-30 | DRG: 606 | Disposition: A | Discharge status: Other Acute Inpatient Hospital | Payer: Medicaid Other | Attending: Internal Medicine | Admitting: Internal Medicine

## 2020-09-30 ENCOUNTER — Inpatient Hospital Stay (HOSPITAL_COMMUNITY)
Admission: AD | Admit: 2020-09-30 | Discharge: 2020-10-07 | DRG: 177 | Disposition: A | Discharge status: Home Health | Payer: Medicaid Other | Source: Other Acute Inpatient Hospital | Attending: Internal Medicine | Admitting: Internal Medicine

## 2020-09-30 ENCOUNTER — Other Ambulatory Visit: Payer: Self-pay

## 2020-09-30 DIAGNOSIS — Z888 Allergy status to other drugs, medicaments and biological substances status: Secondary | ICD-10-CM

## 2020-09-30 DIAGNOSIS — M059 Rheumatoid arthritis with rheumatoid factor, unspecified: Secondary | ICD-10-CM | POA: Diagnosis present

## 2020-09-30 DIAGNOSIS — I452 Bifascicular block: Secondary | ICD-10-CM | POA: Diagnosis present

## 2020-09-30 DIAGNOSIS — Z881 Allergy status to other antibiotic agents status: Secondary | ICD-10-CM

## 2020-09-30 DIAGNOSIS — R9431 Abnormal electrocardiogram [ECG] [EKG]: Secondary | ICD-10-CM | POA: Diagnosis not present

## 2020-09-30 DIAGNOSIS — R7401 Elevation of levels of liver transaminase levels: Secondary | ICD-10-CM

## 2020-09-30 DIAGNOSIS — Z8249 Family history of ischemic heart disease and other diseases of the circulatory system: Secondary | ICD-10-CM

## 2020-09-30 DIAGNOSIS — R64 Cachexia: Secondary | ICD-10-CM | POA: Diagnosis present

## 2020-09-30 DIAGNOSIS — Z9981 Dependence on supplemental oxygen: Secondary | ICD-10-CM

## 2020-09-30 DIAGNOSIS — R042 Hemoptysis: Secondary | ICD-10-CM | POA: Diagnosis not present

## 2020-09-30 DIAGNOSIS — E874 Mixed disorder of acid-base balance: Secondary | ICD-10-CM | POA: Diagnosis present

## 2020-09-30 DIAGNOSIS — B479 Mycetoma, unspecified: Secondary | ICD-10-CM | POA: Diagnosis present

## 2020-09-30 DIAGNOSIS — E440 Moderate protein-calorie malnutrition: Secondary | ICD-10-CM | POA: Diagnosis not present

## 2020-09-30 DIAGNOSIS — K922 Gastrointestinal hemorrhage, unspecified: Secondary | ICD-10-CM | POA: Diagnosis not present

## 2020-09-30 DIAGNOSIS — I864 Gastric varices: Secondary | ICD-10-CM | POA: Diagnosis present

## 2020-09-30 DIAGNOSIS — E872 Acidosis: Secondary | ICD-10-CM | POA: Diagnosis present

## 2020-09-30 DIAGNOSIS — J449 Chronic obstructive pulmonary disease, unspecified: Secondary | ICD-10-CM | POA: Diagnosis not present

## 2020-09-30 DIAGNOSIS — D62 Acute posthemorrhagic anemia: Secondary | ICD-10-CM | POA: Diagnosis not present

## 2020-09-30 DIAGNOSIS — R710 Precipitous drop in hematocrit: Secondary | ICD-10-CM | POA: Diagnosis not present

## 2020-09-30 DIAGNOSIS — N179 Acute kidney failure, unspecified: Secondary | ICD-10-CM | POA: Diagnosis present

## 2020-09-30 DIAGNOSIS — K7460 Unspecified cirrhosis of liver: Secondary | ICD-10-CM | POA: Diagnosis present

## 2020-09-30 DIAGNOSIS — Z681 Body mass index (BMI) 19 or less, adult: Secondary | ICD-10-CM

## 2020-09-30 DIAGNOSIS — T380X5A Adverse effect of glucocorticoids and synthetic analogues, initial encounter: Secondary | ICD-10-CM | POA: Diagnosis present

## 2020-09-30 DIAGNOSIS — M0500 Felty's syndrome, unspecified site: Secondary | ICD-10-CM | POA: Diagnosis present

## 2020-09-30 DIAGNOSIS — E876 Hypokalemia: Secondary | ICD-10-CM | POA: Diagnosis present

## 2020-09-30 DIAGNOSIS — D61818 Other pancytopenia: Secondary | ICD-10-CM | POA: Diagnosis present

## 2020-09-30 DIAGNOSIS — J189 Pneumonia, unspecified organism: Secondary | ICD-10-CM

## 2020-09-30 DIAGNOSIS — Z20822 Contact with and (suspected) exposure to covid-19: Secondary | ICD-10-CM | POA: Diagnosis present

## 2020-09-30 DIAGNOSIS — F17210 Nicotine dependence, cigarettes, uncomplicated: Secondary | ICD-10-CM | POA: Diagnosis present

## 2020-09-30 DIAGNOSIS — Z7952 Long term (current) use of systemic steroids: Secondary | ICD-10-CM | POA: Diagnosis not present

## 2020-09-30 DIAGNOSIS — K219 Gastro-esophageal reflux disease without esophagitis: Secondary | ICD-10-CM | POA: Diagnosis present

## 2020-09-30 DIAGNOSIS — R197 Diarrhea, unspecified: Secondary | ICD-10-CM | POA: Diagnosis present

## 2020-09-30 DIAGNOSIS — D704 Cyclic neutropenia: Secondary | ICD-10-CM | POA: Diagnosis not present

## 2020-09-30 DIAGNOSIS — I5032 Chronic diastolic (congestive) heart failure: Secondary | ICD-10-CM | POA: Diagnosis present

## 2020-09-30 DIAGNOSIS — J440 Chronic obstructive pulmonary disease with acute lower respiratory infection: Secondary | ICD-10-CM | POA: Diagnosis present

## 2020-09-30 DIAGNOSIS — R578 Other shock: Secondary | ICD-10-CM | POA: Diagnosis present

## 2020-09-30 DIAGNOSIS — J851 Abscess of lung with pneumonia: Secondary | ICD-10-CM | POA: Diagnosis present

## 2020-09-30 DIAGNOSIS — J15212 Pneumonia due to Methicillin resistant Staphylococcus aureus: Secondary | ICD-10-CM | POA: Diagnosis present

## 2020-09-30 DIAGNOSIS — K921 Melena: Secondary | ICD-10-CM | POA: Diagnosis not present

## 2020-09-30 DIAGNOSIS — Z9081 Acquired absence of spleen: Secondary | ICD-10-CM

## 2020-09-30 DIAGNOSIS — Z01818 Encounter for other preprocedural examination: Secondary | ICD-10-CM

## 2020-09-30 DIAGNOSIS — Z79899 Other long term (current) drug therapy: Secondary | ICD-10-CM

## 2020-09-30 DIAGNOSIS — E43 Unspecified severe protein-calorie malnutrition: Secondary | ICD-10-CM | POA: Diagnosis present

## 2020-09-30 DIAGNOSIS — D84821 Immunodeficiency due to drugs: Secondary | ICD-10-CM

## 2020-09-30 DIAGNOSIS — Z6822 Body mass index (BMI) 22.0-22.9, adult: Secondary | ICD-10-CM | POA: Diagnosis not present

## 2020-09-30 DIAGNOSIS — Z825 Family history of asthma and other chronic lower respiratory diseases: Secondary | ICD-10-CM | POA: Diagnosis not present

## 2020-09-30 DIAGNOSIS — Z8711 Personal history of peptic ulcer disease: Secondary | ICD-10-CM

## 2020-09-30 DIAGNOSIS — E2740 Unspecified adrenocortical insufficiency: Secondary | ICD-10-CM | POA: Diagnosis present

## 2020-09-30 DIAGNOSIS — D849 Immunodeficiency, unspecified: Secondary | ICD-10-CM | POA: Diagnosis present

## 2020-09-30 DIAGNOSIS — I8510 Secondary esophageal varices without bleeding: Secondary | ICD-10-CM | POA: Diagnosis present

## 2020-09-30 DIAGNOSIS — K2100 Gastro-esophageal reflux disease with esophagitis, without bleeding: Secondary | ICD-10-CM | POA: Diagnosis not present

## 2020-09-30 DIAGNOSIS — K649 Unspecified hemorrhoids: Secondary | ICD-10-CM | POA: Diagnosis present

## 2020-09-30 DIAGNOSIS — J9621 Acute and chronic respiratory failure with hypoxia: Secondary | ICD-10-CM | POA: Diagnosis present

## 2020-09-30 DIAGNOSIS — J984 Other disorders of lung: Secondary | ICD-10-CM | POA: Diagnosis not present

## 2020-09-30 DIAGNOSIS — E875 Hyperkalemia: Secondary | ICD-10-CM | POA: Diagnosis present

## 2020-09-30 DIAGNOSIS — R7881 Bacteremia: Secondary | ICD-10-CM

## 2020-09-30 DIAGNOSIS — R531 Weakness: Secondary | ICD-10-CM | POA: Diagnosis not present

## 2020-09-30 LAB — CBC WITH DIFFERENTIAL/PLATELET
Abs Immature Granulocytes: 0.24 10*3/uL — ABNORMAL HIGH (ref 0.00–0.07)
Abs Immature Granulocytes: 0.21 10*3/uL — ABNORMAL HIGH (ref 0.00–0.07)
Basophils Absolute: 0 10*3/uL (ref 0.0–0.1)
Basophils Absolute: 0 10*3/uL (ref 0.0–0.1)
Basophils Relative: 0 %
Basophils Relative: 0 %
Eosinophils Absolute: 0 10*3/uL (ref 0.0–0.5)
Eosinophils Absolute: 0 10*3/uL (ref 0.0–0.5)
Eosinophils Relative: 0 %
Eosinophils Relative: 0 %
HCT: 25 % — ABNORMAL LOW (ref 39.0–52.0)
HCT: 25.8 % — ABNORMAL LOW (ref 39.0–52.0)
Hemoglobin: 8 g/dL — ABNORMAL LOW (ref 13.0–17.0)
Hemoglobin: 8.9 g/dL — ABNORMAL LOW (ref 13.0–17.0)
Immature Granulocytes: 3 %
Immature Granulocytes: 3 %
Lymphocytes Relative: 23 %
Lymphocytes Relative: 18 %
Lymphs Abs: 1.6 10*3/uL (ref 0.7–4.0)
Lymphs Abs: 1.5 10*3/uL (ref 0.7–4.0)
MCH: 28.4 pg (ref 26.0–34.0)
MCH: 29.3 pg (ref 26.0–34.0)
MCHC: 32 g/dL (ref 30.0–36.0)
MCHC: 34.5 g/dL (ref 30.0–36.0)
MCV: 88.7 fL (ref 80.0–100.0)
MCV: 84.9 fL (ref 80.0–100.0)
Monocytes Absolute: 0.7 10*3/uL (ref 0.1–1.0)
Monocytes Absolute: 1.3 10*3/uL — ABNORMAL HIGH (ref 0.1–1.0)
Monocytes Relative: 10 %
Monocytes Relative: 15 %
Neutro Abs: 4.6 10*3/uL (ref 1.7–7.7)
Neutro Abs: 5.3 10*3/uL (ref 1.7–7.7)
Neutrophils Relative %: 64 %
Neutrophils Relative %: 64 %
Platelets: 104 10*3/uL — ABNORMAL LOW (ref 150–400)
Platelets: 77 10*3/uL — ABNORMAL LOW (ref 150–400)
RBC: 2.82 MIL/uL — ABNORMAL LOW (ref 4.22–5.81)
RBC: 3.04 MIL/uL — ABNORMAL LOW (ref 4.22–5.81)
RDW: 22 % — ABNORMAL HIGH (ref 11.5–15.5)
RDW: 17.7 % — ABNORMAL HIGH (ref 11.5–15.5)
Smear Review: NORMAL
WBC: 7.3 10*3/uL (ref 4.0–10.5)
WBC: 8.2 10*3/uL (ref 4.0–10.5)
nRBC: 2.6 % — ABNORMAL HIGH (ref 0.0–0.2)
nRBC: 2.2 % — ABNORMAL HIGH (ref 0.0–0.2)

## 2020-09-30 LAB — RESP PANEL BY RT-PCR (FLU A&B, COVID) ARPGX2
Influenza A by PCR: NEGATIVE
Influenza B by PCR: NEGATIVE
SARS Coronavirus 2 by RT PCR: NEGATIVE

## 2020-09-30 LAB — LACTIC ACID, PLASMA
Lactic Acid, Venous: 9.1 mmol/L (ref 0.5–1.9)
Lactic Acid, Venous: 8.2 mmol/L (ref 0.5–1.9)
Lactic Acid, Venous: 2.2 mmol/L (ref 0.5–1.9)

## 2020-09-30 LAB — COMPREHENSIVE METABOLIC PANEL
ALT: 179 U/L — ABNORMAL HIGH (ref 0–44)
AST: 296 U/L — ABNORMAL HIGH (ref 15–41)
Albumin: 2 g/dL — ABNORMAL LOW (ref 3.5–5.0)
Alkaline Phosphatase: 155 U/L — ABNORMAL HIGH (ref 38–126)
Anion gap: 17 — ABNORMAL HIGH (ref 5–15)
BUN: 88 mg/dL — ABNORMAL HIGH (ref 6–20)
CO2: 18 mmol/L — ABNORMAL LOW (ref 22–32)
Calcium: 7.1 mg/dL — ABNORMAL LOW (ref 8.9–10.3)
Chloride: 101 mmol/L (ref 98–111)
Creatinine, Ser: 1.55 mg/dL — ABNORMAL HIGH (ref 0.61–1.24)
GFR, Estimated: 51 mL/min — ABNORMAL LOW (ref >60)
Glucose, Bld: 94 mg/dL (ref 70–99)
Potassium: 5.4 mmol/L — ABNORMAL HIGH (ref 3.5–5.1)
Sodium: 136 mmol/L (ref 135–145)
Total Bilirubin: 1 mg/dL (ref 0.3–1.2)
Total Protein: 5 g/dL — ABNORMAL LOW (ref 6.5–8.1)

## 2020-09-30 LAB — URINALYSIS, COMPLETE (UACMP) WITH MICROSCOPIC
Bacteria, UA: NONE SEEN
Bilirubin Urine: NEGATIVE
Glucose, UA: NEGATIVE mg/dL
Ketones, ur: 5 mg/dL — AB
Leukocytes,Ua: NEGATIVE
Nitrite: NEGATIVE
Protein, ur: NEGATIVE mg/dL
Specific Gravity, Urine: 1.018 (ref 1.005–1.030)
Squamous Epithelial / HPF: NONE SEEN (ref 0–5)
pH: 5 (ref 5.0–8.0)

## 2020-09-30 LAB — BLOOD CULTURE ID PANEL (REFLEXED) - BCID2

## 2020-09-30 LAB — TROPONIN I (HIGH SENSITIVITY)
Troponin I (High Sensitivity): 78 ng/L — ABNORMAL HIGH (ref <18)
Troponin I (High Sensitivity): 64 ng/L — ABNORMAL HIGH (ref <18)

## 2020-09-30 LAB — HEMOGLOBIN AND HEMATOCRIT, BLOOD
HCT: 23.1 % — ABNORMAL LOW (ref 39.0–52.0)
Hemoglobin: 8 g/dL — ABNORMAL LOW (ref 13.0–17.0)

## 2020-09-30 LAB — PREPARE RBC (CROSSMATCH)

## 2020-09-30 LAB — APTT: aPTT: 40 seconds — ABNORMAL HIGH (ref 24–36)

## 2020-09-30 LAB — MAGNESIUM: Magnesium: 2 mg/dL (ref 1.7–2.4)

## 2020-09-30 LAB — PROTIME-INR
INR: 1.6 — ABNORMAL HIGH (ref 0.8–1.2)
Prothrombin Time: 19.1 seconds — ABNORMAL HIGH (ref 11.4–15.2)

## 2020-09-30 IMAGING — DX DG CHEST 1V PORT
1 series · 2 of 2 positions shown · non-contrast
Comparison: [DATE].

CLINICAL DATA: Hemoptysis, GI bleed.

EXAM:
PORTABLE CHEST 1 VIEW

[Series 1: chest ap · 0.14mm/px · 2 of 2 slices shown]
[im 1/2]
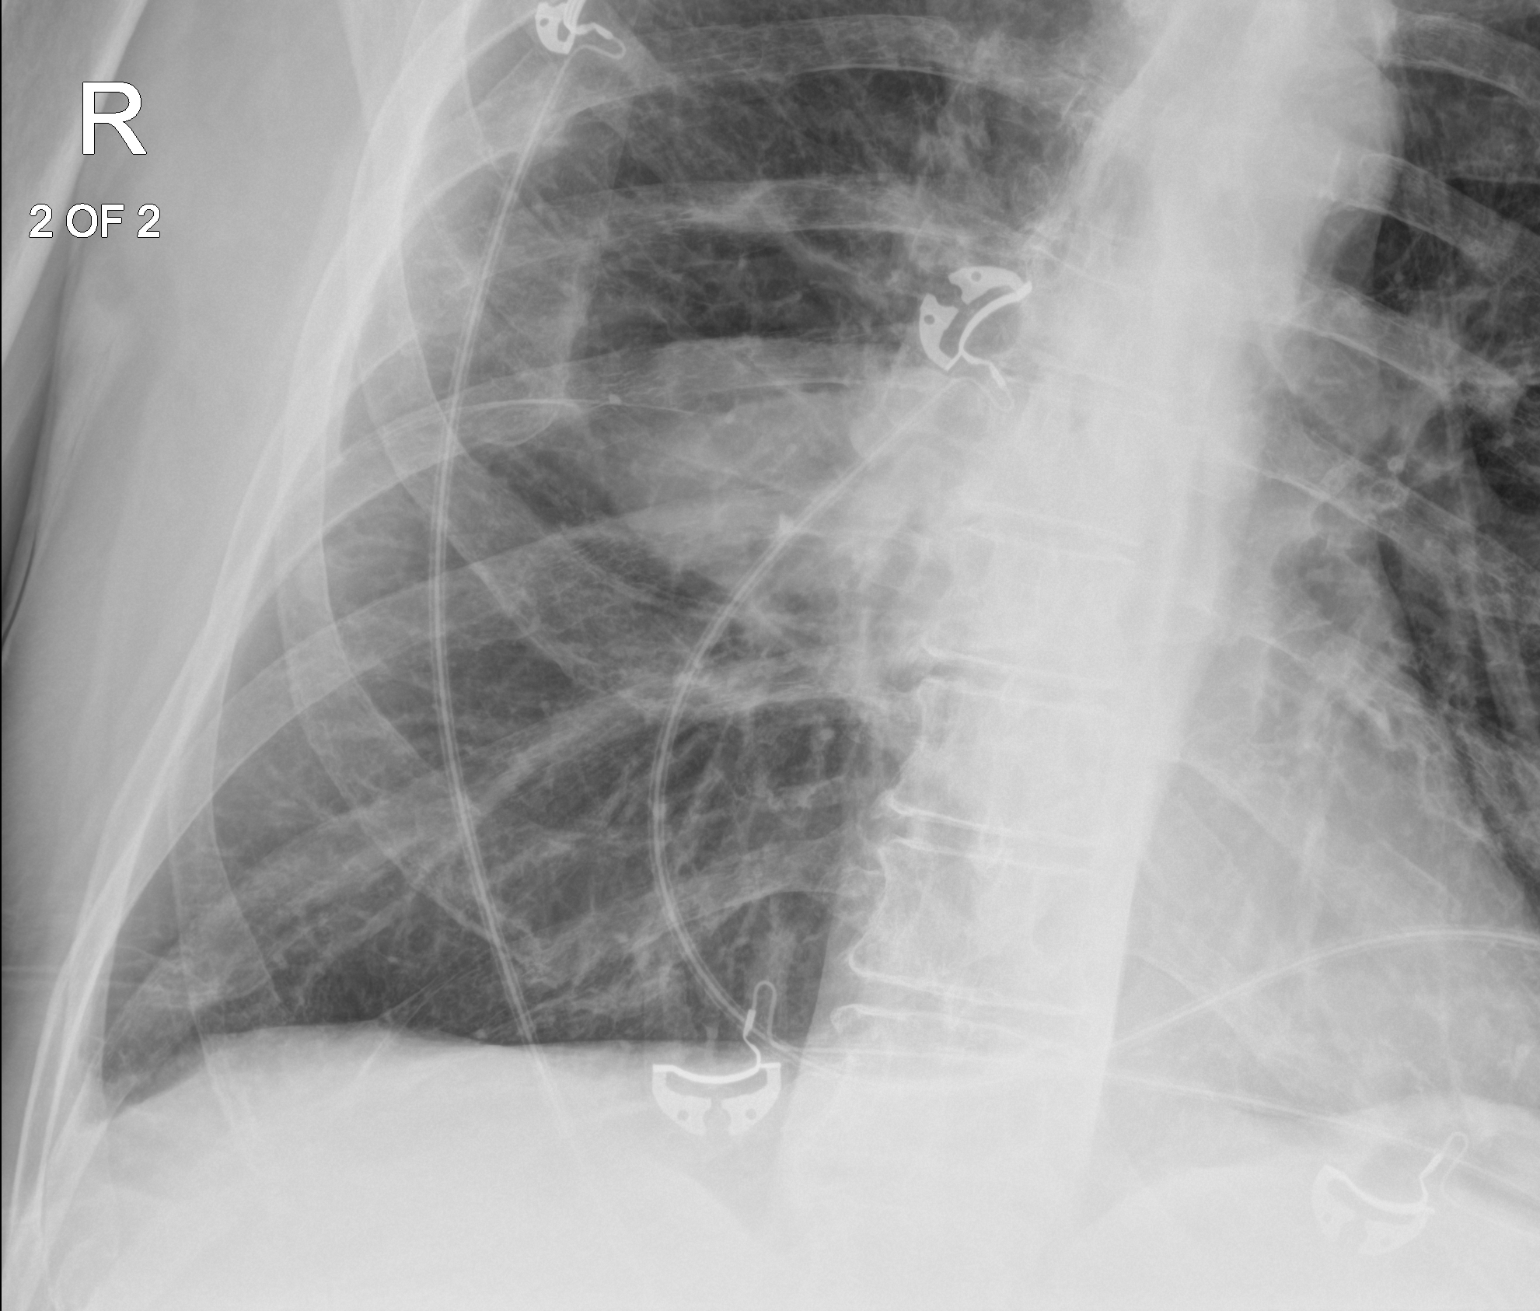
[im 2/2]
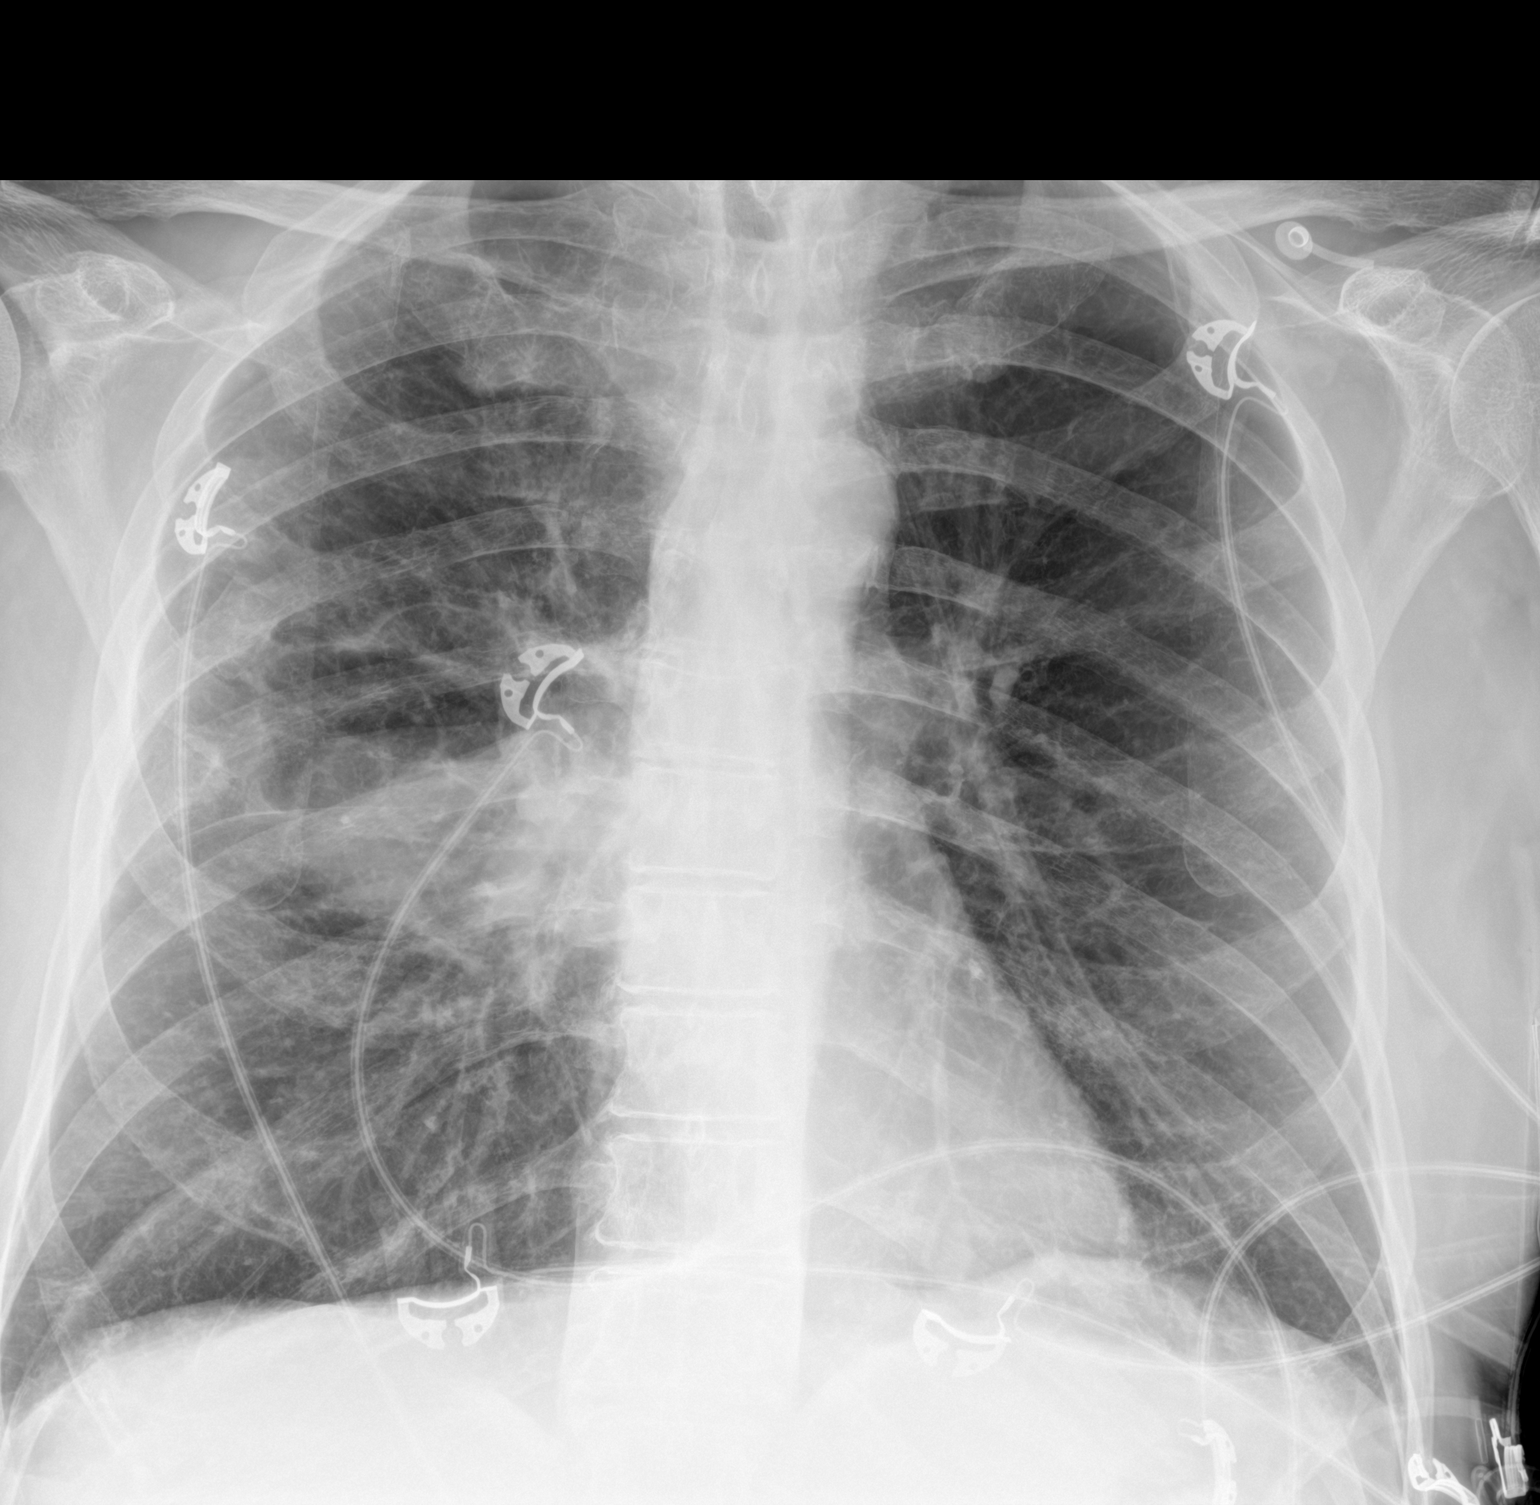

[2 of 2 positions shown; findings below may reference images not displayed]

FINDINGS: EKG leads project over the chest.

Persistent opacity in the RIGHT mid chest in the area of the
cavitary focus that was seen on prior CT imaging. Unchanged compared
to [DATE].

No new areas of lobar consolidative changes.

No pneumothorax.  No pleural effusion on frontal radiograph.

No acute bone finding on limited assessment.
IMPRESSION: 1. Persistent opacity in the RIGHT mid chest in the area of the
cavitary focus that was seen on prior CT imaging.
2. Essentially no change from recent comparison.

## 2020-09-30 IMAGING — CT CT ANGIO CHEST
2 of 6 series · 17 of 46 positions shown · IV contrast (APPLIED)
Comparison: [DATE]

CLINICAL DATA: Hemoptysis, hematochezia, evaluate for GI bleed

EXAM:
CT ANGIOGRAPHY CHEST, ABDOMEN AND PELVIS
TECHNIQUE: Non-contrast CT of the abdomen and pelvis initially obtained.

[Series 3: thins · axial · 0.69mm/px · z∈[-770,-474]mm · 14 of 404 slices shown]
[im 17/404  lung]
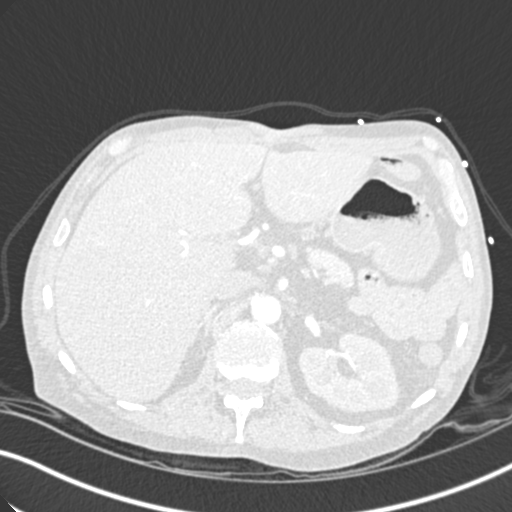
[im 51/404  soft-tissue]
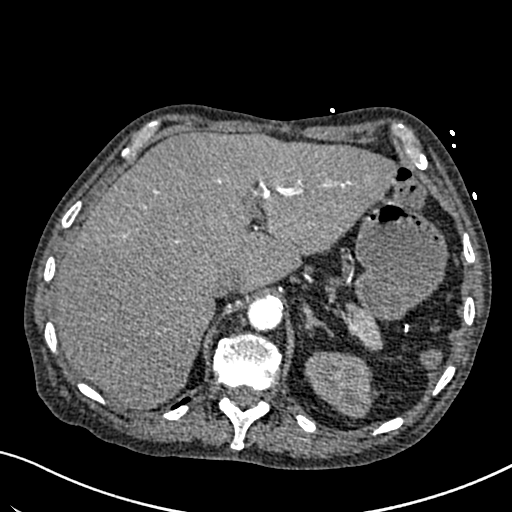
[im 84/404  lung]
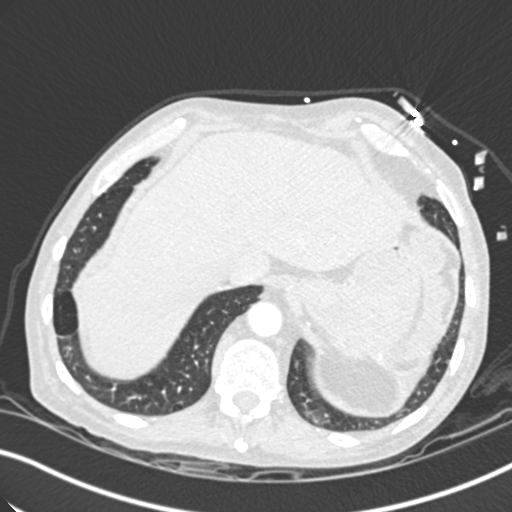
[im 101/404  soft-tissue]
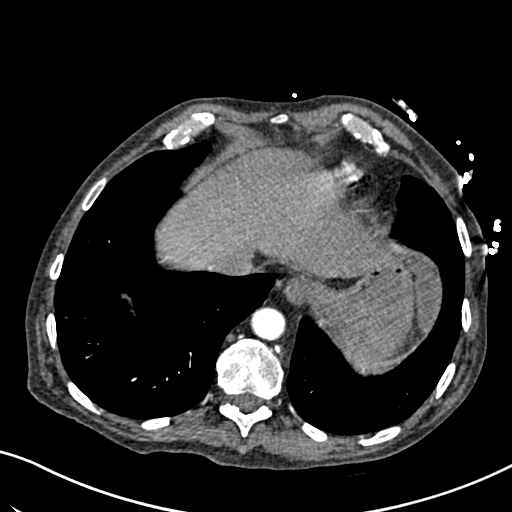
[im 135/404  lung]
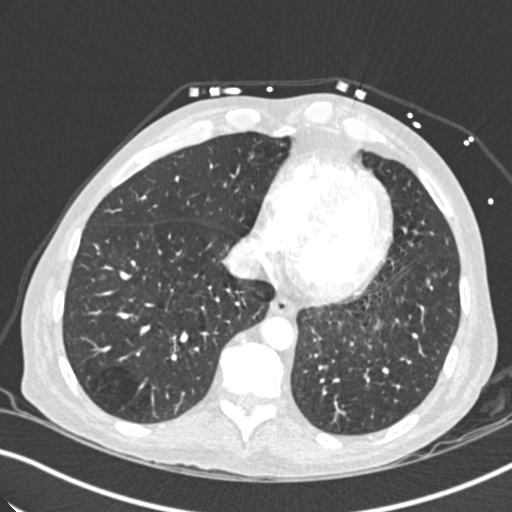
[im 168/404  soft-tissue]
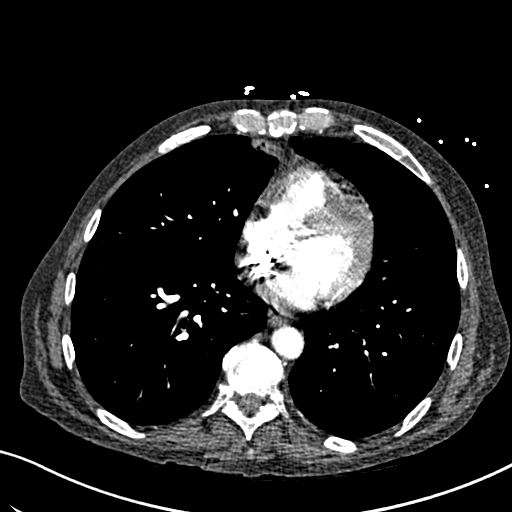
[im 185/404  lung]
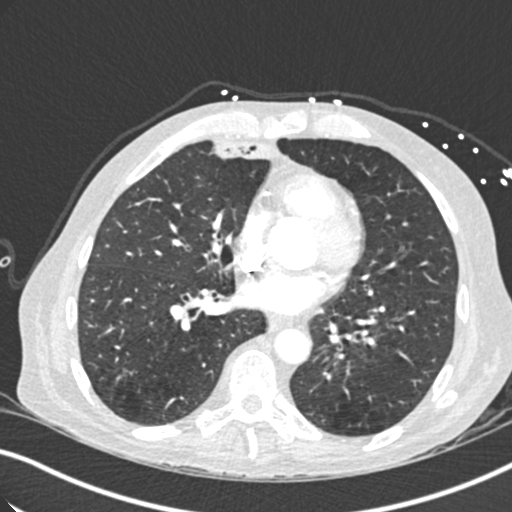
[im 219/404  soft-tissue]
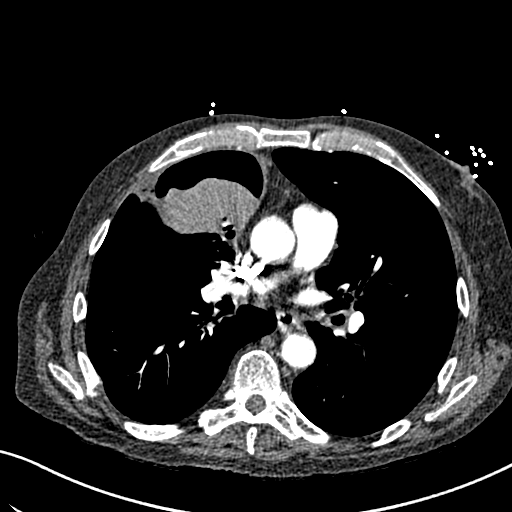
[im 236/404  lung]
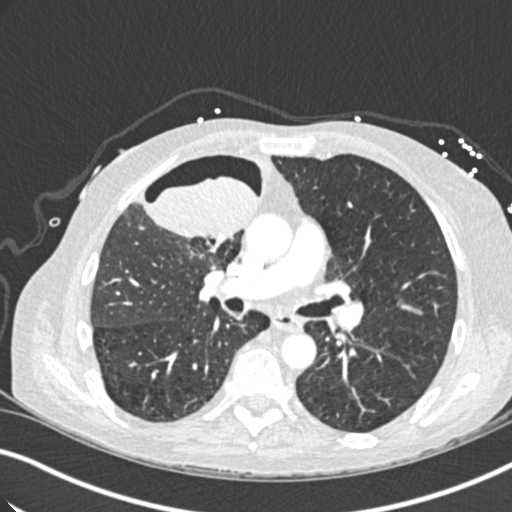
[im 269/404  soft-tissue]
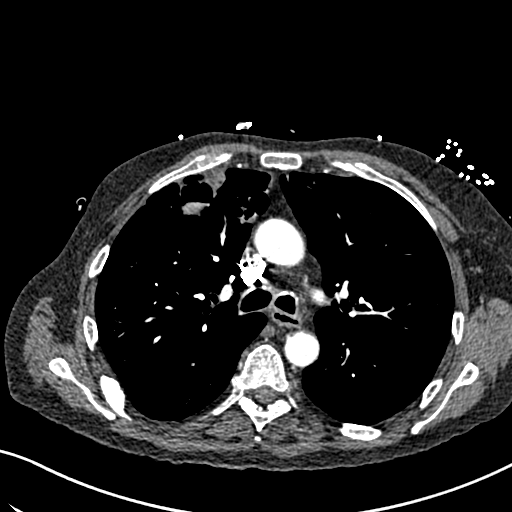
[im 303/404  lung]
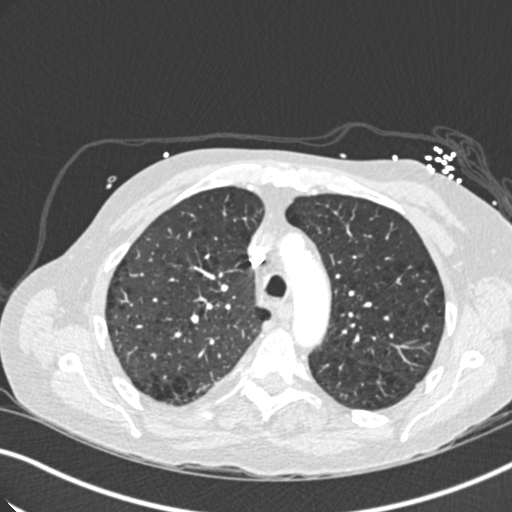
[im 320/404  soft-tissue]
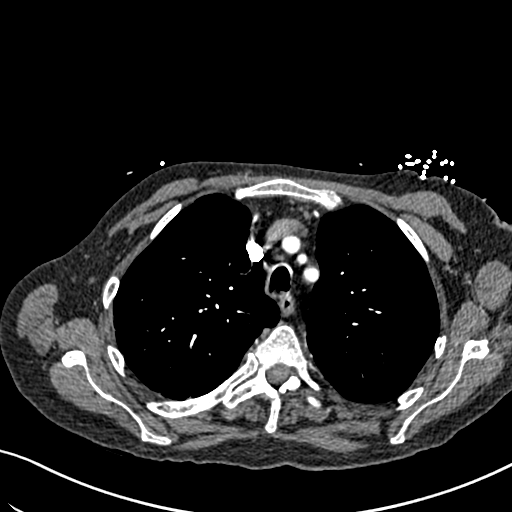
[im 353/404  lung]
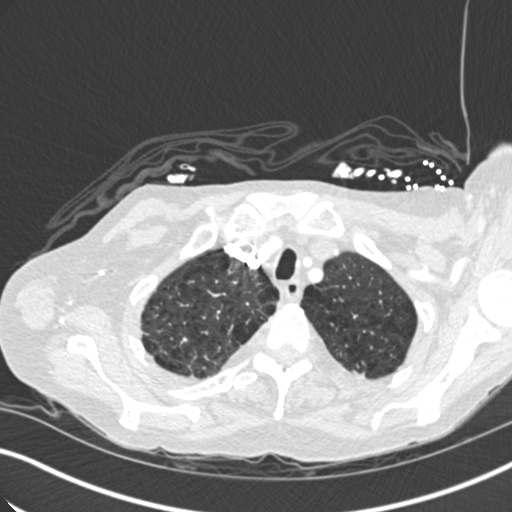
[im 387/404  soft-tissue]
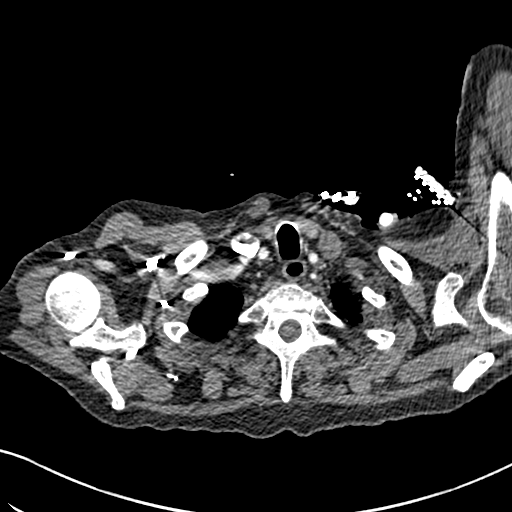

[Series 5: coronal mpr · coronal · 0.63mm/px · 3 of 89 slices shown]
[im 23/89  soft-tissue]
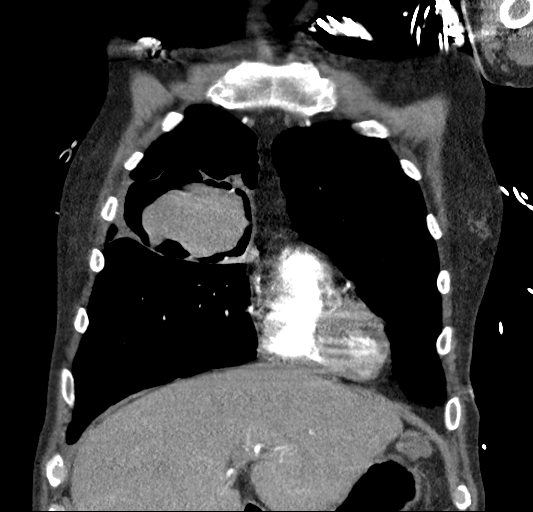
[im 45/89  soft-tissue]
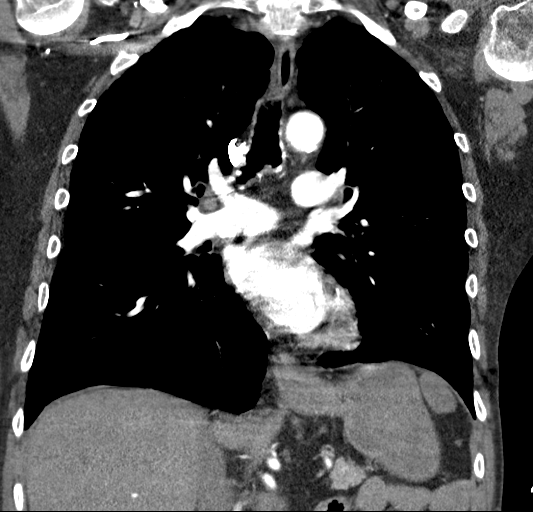
[im 67/89  soft-tissue]
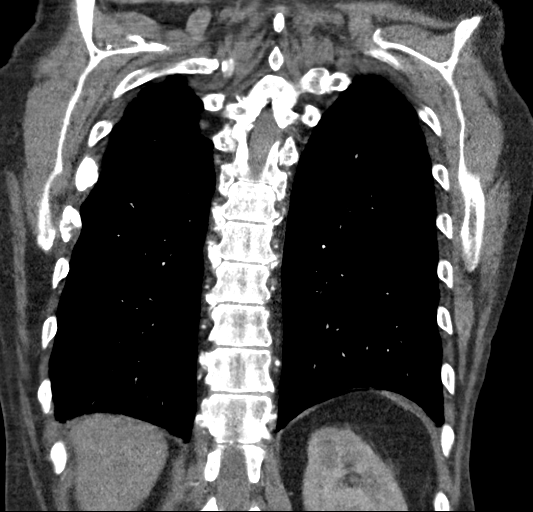

[17 of 46 positions shown; findings below may reference images not displayed]

Multidetector CT imaging through the chest, abdomen and pelvis was
performed using the standard protocol during bolus administration of
intravenous contrast. Multiplanar reconstructed images and MIPs were
obtained and reviewed to evaluate the vascular anatomy.

CONTRAST:  100mL OMNIPAQUE IOHEXOL 350 MG/ML SOLN
FINDINGS: CTA CHEST FINDINGS

Cardiovascular: Preferential opacification of the thoracic aorta.
Normal contour and caliber of the thoracic aorta. No evidence of
aneurysm, dissection, or other acute aortic pathology. Scattered
aortic atherosclerosis. No evidence of pulmonary embolism on this
non tailored examination. Normal heart size. Three-vessel coronary
artery calcifications. No pericardial effusion.

Mediastinum/Nodes: No enlarged mediastinal, hilar, or axillary lymph
nodes. Thyroid gland, trachea, and esophagus demonstrate no
significant findings.

Lungs/Pleura: Mild centrilobular emphysema. Diffuse bilateral
bronchial wall thickening. Redemonstrated relatively thick-walled
cavitary lesion of the anterior right upper lobe containing soft
tissue attenuation debris and measuring approximately 8.4 x 5.7 cm
(series 4, image 48). No pleural effusion or pneumothorax.

Musculoskeletal: No chest wall abnormality. No acute or significant
osseous findings.

Review of the MIP images confirms the above findings.

CTA ABDOMEN AND PELVIS FINDINGS

VASCULAR

Redemonstrated aneurysm of the infrarenal abdominal aorta, measuring
up to 3.2 x 3.1 cm in caliber. No evidence of rupture, dissection,
or other acute aortic pathology. Standard branching pattern of the
abdominal aorta, with solitary bilateral renal arteries. Moderate
mixed calcific atherosclerosis, with atherosclerosis at the branch
vessel origins without high-grade stenosis.

Review of the MIP images confirms the above findings.

NON-VASCULAR

Hepatobiliary: No solid liver abnormality is seen. Small gallstones
in the dependent gallbladder. Gallbladder wall thickening, or
biliary dilatation.

Pancreas: Unremarkable. No pancreatic ductal dilatation or
surrounding inflammatory changes.

Spleen: Normal in size without significant abnormality.

Adrenals/Urinary Tract: Adrenal glands are unremarkable. Kidneys are
normal, without renal calculi, solid lesion, or hydronephrosis.
Bladder is unremarkable.

Stomach/Bowel: Stomach is within normal limits. Appendix appears
normal. No evidence of bowel wall thickening, distention, or
inflammatory changes. There is contrast extravasation at the anus
(series 6, image 85).

Lymphatic: No enlarged abdominal or pelvic lymph nodes.

Reproductive: No mass or other significant abnormality.

Other: No abdominal wall hernia or abnormality. No abdominopelvic
ascites.

Musculoskeletal: No acute or significant osseous findings.

Review of the MIP images confirms the above findings.
IMPRESSION: 1. Redemonstrated, thick-walled cavitary lesion of the anterior
right upper lobe containing soft tissue attenuation history. This
remains most consistent with cavitary infection, likely complicated
by mycetoma. Cavitary malignancy is a significant differential
consideration.
2. There is contrast extravasation at the anus, consistent with
hemorrhoidal bleeding or other very distal etiology of GI bleed.
3. Redemonstrated aneurysm of the infrarenal abdominal aorta,
measuring up to 3.2 x 3.1 cm in caliber. No evidence of rupture,
dissection, or other acute aortic pathology. Recommend follow-up
ultrasound every 3 years. This recommendation follows ACR consensus
guidelines: White Paper of the ACR Incidental Findings Committee II
on Vascular Findings. [HOSPITAL] [[facility]]; [DATE].
4. Emphysema.
5. Diffuse bilateral bronchial wall thickening, consistent with
nonspecific infectious or inflammatory bronchitis.
6. Coronary artery disease.
7. Cholelithiasis.

Aortic Atherosclerosis ([[facility]]-[Y[facility]]) and Emphysema ([YU]-[YU]).

## 2020-09-30 IMAGING — CT CT CTA ABD/PEL W/CM AND/OR W/O CM
3 of 10 series · 10 of 46 positions shown, 16 images · IV contrast (APPLIED)
Comparison: [DATE]

CLINICAL DATA: Hemoptysis, hematochezia, evaluate for GI bleed

EXAM:
CT ANGIOGRAPHY CHEST, ABDOMEN AND PELVIS
TECHNIQUE: Non-contrast CT of the abdomen and pelvis initially obtained.

[Series 5: axial arterial · axial · arterial · 0.70mm/px · z∈[-1085,-921]mm · 4 of 230 slices shown]
[im 17/230  soft-tissue]
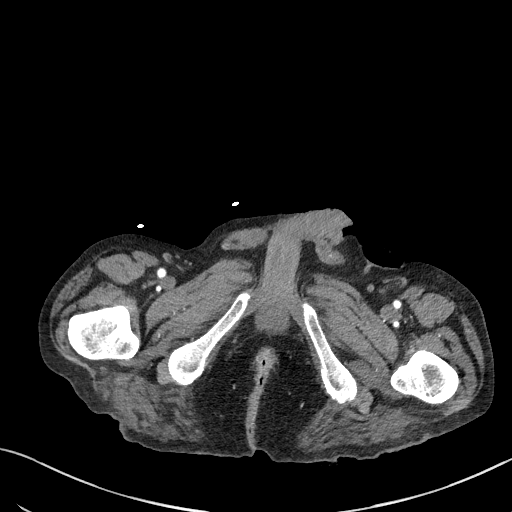
[im 50/230  soft-tissue]
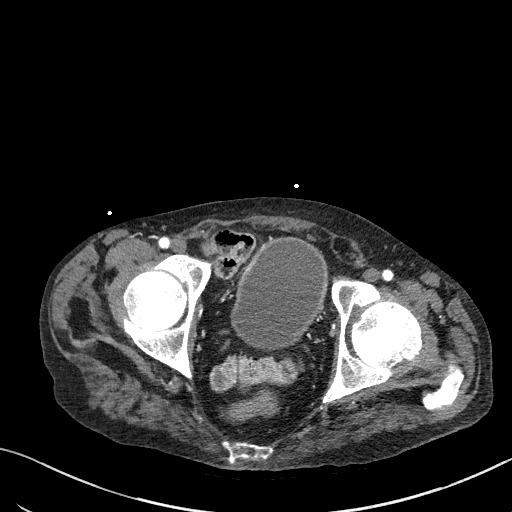
[im 82/230  soft-tissue]
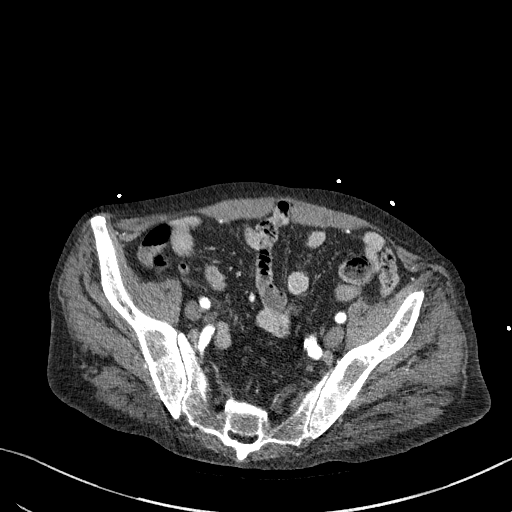
[im 99/230  soft-tissue]
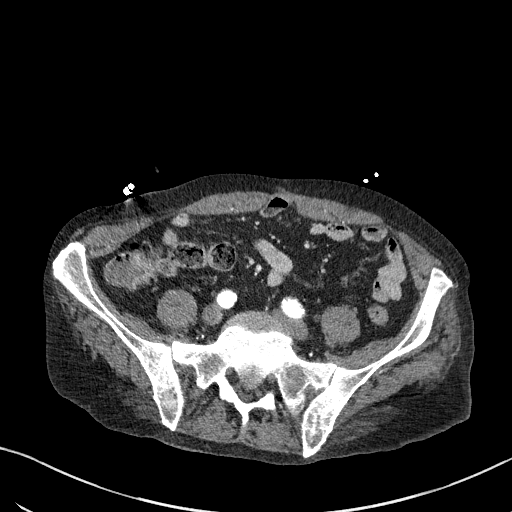

[Series 6: axial venous · axial · portal-venous · 0.70mm/px · z∈[-1024,-754]mm · 4 of 92 slices shown, 9 images]
[im 19/92  soft-tissue]
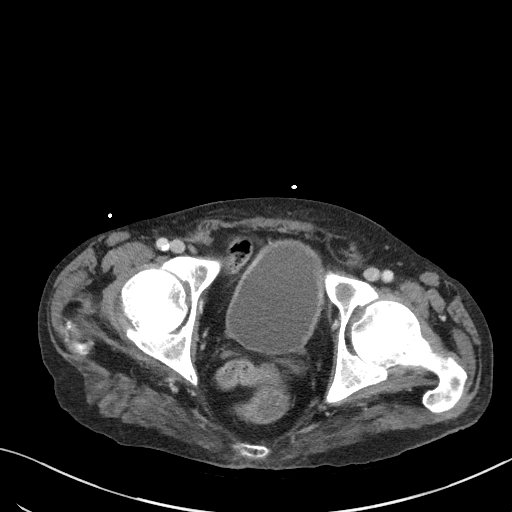
[im 19/92  lung]
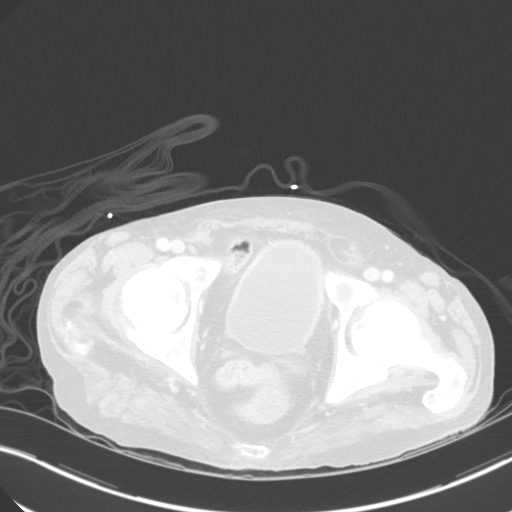
[im 19/92  bone]
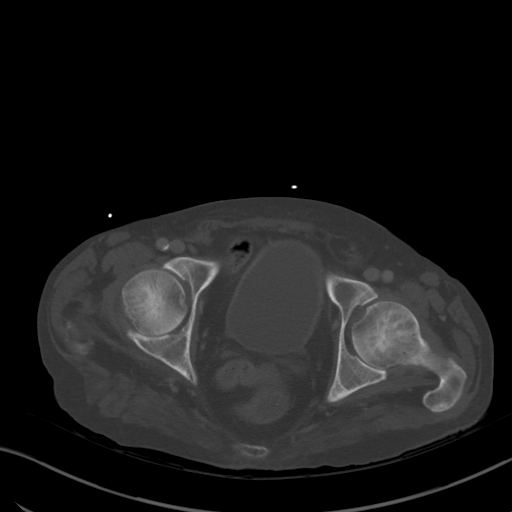
[im 37/92  soft-tissue]
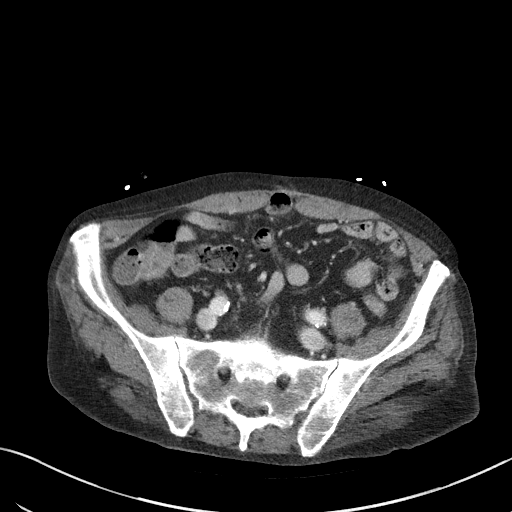
[im 37/92  lung]
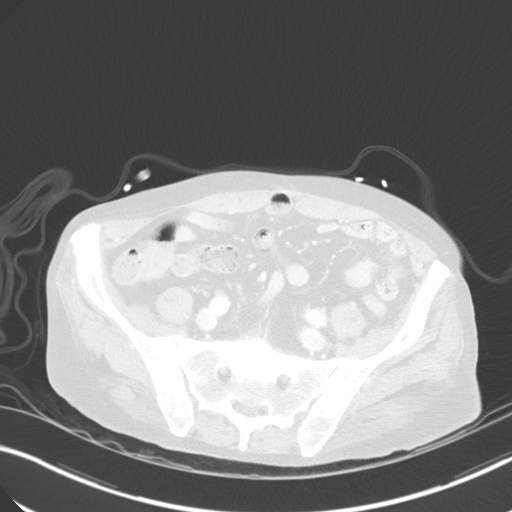
[im 55/92  soft-tissue]
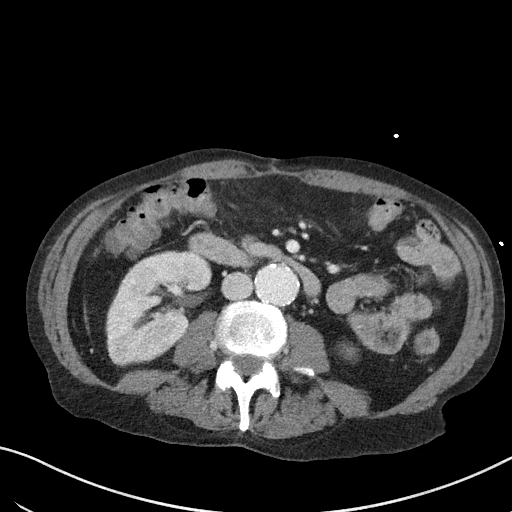
[im 55/92  lung]
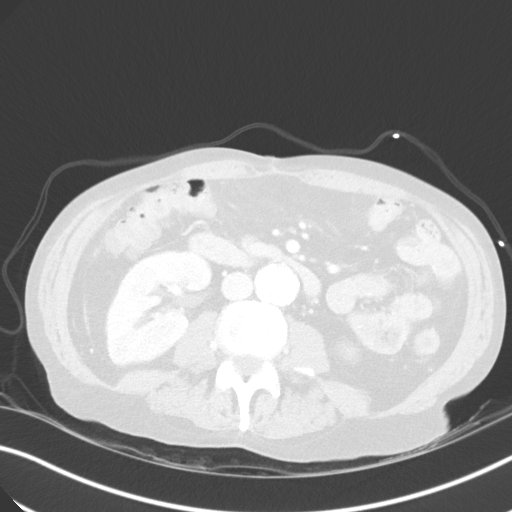
[im 73/92  soft-tissue]
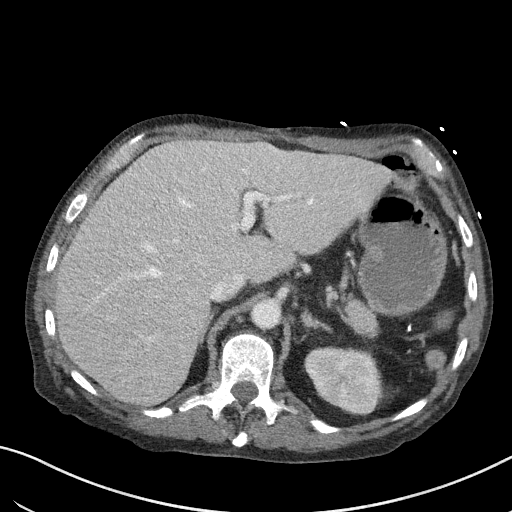
[im 73/92  lung]
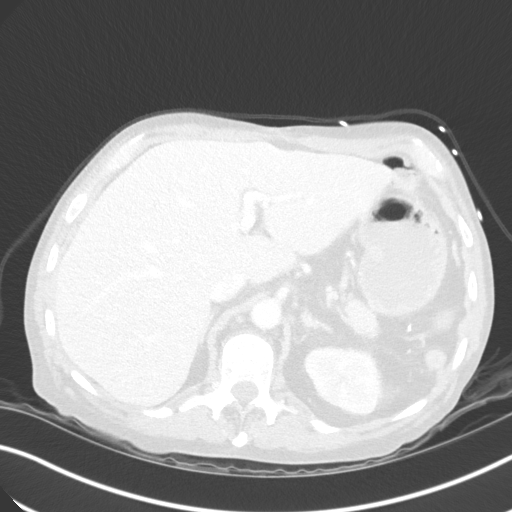

[Series 10: coronal mpr · coronal · 0.78mm/px · 2 of 135 slices shown, 3 images]
[im 45/135  soft-tissue]
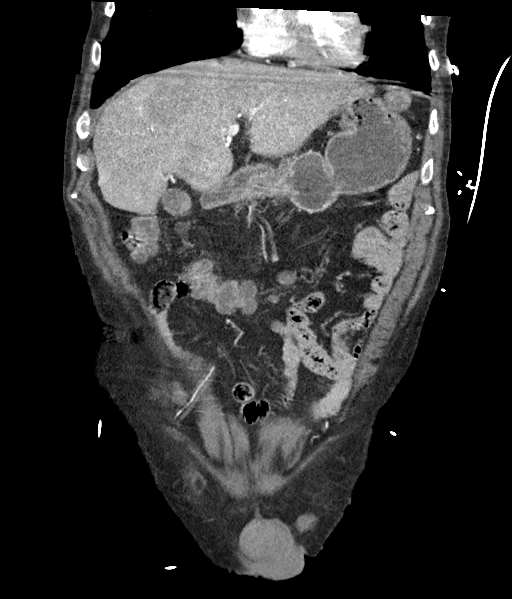
[im 45/135  bone]
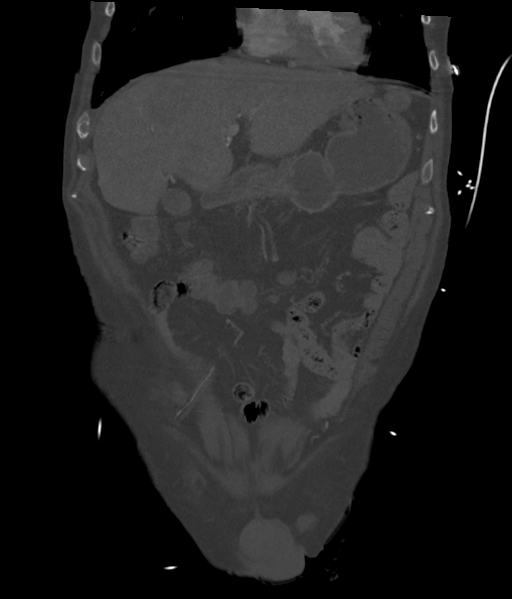
[im 90/135  soft-tissue]
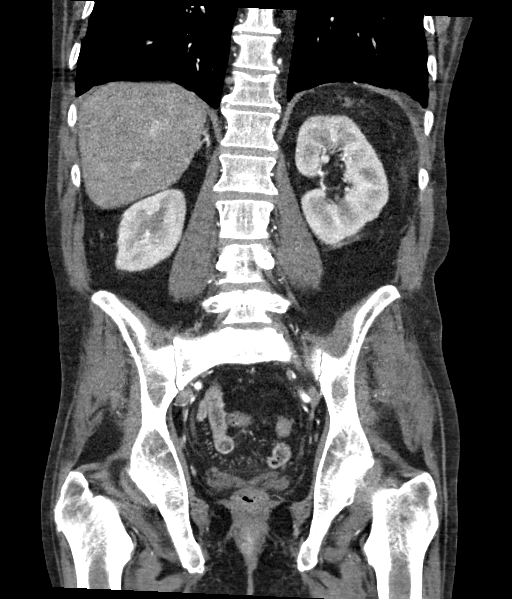

[10 of 46 positions shown; findings below may reference images not displayed]

Multidetector CT imaging through the chest, abdomen and pelvis was
performed using the standard protocol during bolus administration of
intravenous contrast. Multiplanar reconstructed images and MIPs were
obtained and reviewed to evaluate the vascular anatomy.

CONTRAST:  100mL OMNIPAQUE IOHEXOL 350 MG/ML SOLN
FINDINGS: CTA CHEST FINDINGS

Cardiovascular: Preferential opacification of the thoracic aorta.
Normal contour and caliber of the thoracic aorta. No evidence of
aneurysm, dissection, or other acute aortic pathology. Scattered
aortic atherosclerosis. No evidence of pulmonary embolism on this
non tailored examination. Normal heart size. Three-vessel coronary
artery calcifications. No pericardial effusion.

Mediastinum/Nodes: No enlarged mediastinal, hilar, or axillary lymph
nodes. Thyroid gland, trachea, and esophagus demonstrate no
significant findings.

Lungs/Pleura: Mild centrilobular emphysema. Diffuse bilateral
bronchial wall thickening. Redemonstrated relatively thick-walled
cavitary lesion of the anterior right upper lobe containing soft
tissue attenuation debris and measuring approximately 8.4 x 5.7 cm
(series 4, image 48). No pleural effusion or pneumothorax.

Musculoskeletal: No chest wall abnormality. No acute or significant
osseous findings.

Review of the MIP images confirms the above findings.

CTA ABDOMEN AND PELVIS FINDINGS

VASCULAR

Redemonstrated aneurysm of the infrarenal abdominal aorta, measuring
up to 3.2 x 3.1 cm in caliber. No evidence of rupture, dissection,
or other acute aortic pathology. Standard branching pattern of the
abdominal aorta, with solitary bilateral renal arteries. Moderate
mixed calcific atherosclerosis, with atherosclerosis at the branch
vessel origins without high-grade stenosis.

Review of the MIP images confirms the above findings.

NON-VASCULAR

Hepatobiliary: No solid liver abnormality is seen. Small gallstones
in the dependent gallbladder. Gallbladder wall thickening, or
biliary dilatation.

Pancreas: Unremarkable. No pancreatic ductal dilatation or
surrounding inflammatory changes.

Spleen: Normal in size without significant abnormality.

Adrenals/Urinary Tract: Adrenal glands are unremarkable. Kidneys are
normal, without renal calculi, solid lesion, or hydronephrosis.
Bladder is unremarkable.

Stomach/Bowel: Stomach is within normal limits. Appendix appears
normal. No evidence of bowel wall thickening, distention, or
inflammatory changes. There is contrast extravasation at the anus
(series 6, image 85).

Lymphatic: No enlarged abdominal or pelvic lymph nodes.

Reproductive: No mass or other significant abnormality.

Other: No abdominal wall hernia or abnormality. No abdominopelvic
ascites.

Musculoskeletal: No acute or significant osseous findings.

Review of the MIP images confirms the above findings.
IMPRESSION: 1. Redemonstrated, thick-walled cavitary lesion of the anterior
right upper lobe containing soft tissue attenuation history. This
remains most consistent with cavitary infection, likely complicated
by mycetoma. Cavitary malignancy is a significant differential
consideration.
2. There is contrast extravasation at the anus, consistent with
hemorrhoidal bleeding or other very distal etiology of GI bleed.
3. Redemonstrated aneurysm of the infrarenal abdominal aorta,
measuring up to 3.2 x 3.1 cm in caliber. No evidence of rupture,
dissection, or other acute aortic pathology. Recommend follow-up
ultrasound every 3 years. This recommendation follows ACR consensus
guidelines: White Paper of the ACR Incidental Findings Committee II
on Vascular Findings. [HOSPITAL] [[facility]]; [DATE].
4. Emphysema.
5. Diffuse bilateral bronchial wall thickening, consistent with
nonspecific infectious or inflammatory bronchitis.
6. Coronary artery disease.
7. Cholelithiasis.

Aortic Atherosclerosis ([YU]-[YU]) and Emphysema ([YU]-[YU]).

## 2020-09-30 MED ORDER — SODIUM CHLORIDE 0.9 % IV SOLN: INTRAVENOUS | Status: DC

## 2020-09-30 MED ORDER — ENSURE ENLIVE PO LIQD: 237.0000 mL | Freq: Two times a day (BID) | ORAL | Status: DC

## 2020-09-30 MED ORDER — LINEZOLID 600 MG PO TABS: 600.0000 mg | ORAL_TABLET | Freq: Two times a day (BID) | ORAL | Status: DC

## 2020-09-30 MED ORDER — LACTATED RINGERS IV BOLUS
1000.0000 mL | Freq: Once | INTRAVENOUS | Status: AC
  Administered 2020-09-30: 1000 mL via INTRAVENOUS

## 2020-09-30 MED ORDER — TRANEXAMIC ACID FOR INHALATION
500.0000 mg | Freq: Three times a day (TID) | RESPIRATORY_TRACT | Status: DC
  Administered 2020-10-01: 500 mg via RESPIRATORY_TRACT
  Administered 2020-10-01: 5 mg via RESPIRATORY_TRACT
  Filled 2020-09-30 (×3): qty 10

## 2020-09-30 MED ORDER — METRONIDAZOLE 500 MG PO TABS: 500.0000 mg | ORAL_TABLET | Freq: Two times a day (BID) | ORAL | Status: DC

## 2020-09-30 MED ORDER — MUPIROCIN 2 % EX OINT
TOPICAL_OINTMENT | Freq: Two times a day (BID) | CUTANEOUS | Status: DC
  Filled 2020-09-30: qty 22

## 2020-09-30 MED ORDER — ADULT MULTIVITAMIN W/MINERALS CH
1.0000 | ORAL_TABLET | Freq: Every day | ORAL | Status: DC
  Administered 2020-09-30: 1 via ORAL
  Filled 2020-09-30: qty 1

## 2020-09-30 MED ORDER — PANTOPRAZOLE INFUSION (NEW) - SIMPLE MED
8.0000 mg/h | INTRAVENOUS | Status: DC
  Administered 2020-09-30: 8 mg/h via INTRAVENOUS
  Filled 2020-09-30: qty 80

## 2020-09-30 MED ORDER — PREDNISONE 10 MG PO TABS
10.0000 mg | ORAL_TABLET | Freq: Two times a day (BID) | ORAL | Status: DC
  Administered 2020-09-30: 10 mg via ORAL
  Filled 2020-09-30: qty 1

## 2020-09-30 MED ORDER — ACETAMINOPHEN 650 MG RE SUPP: 650.0000 mg | Freq: Four times a day (QID) | RECTAL | Status: DC | PRN

## 2020-09-30 MED ORDER — LINEZOLID 600 MG/300ML IV SOLN
600.0000 mg | Freq: Two times a day (BID) | INTRAVENOUS | Status: DC
  Administered 2020-09-30: 600 mg via INTRAVENOUS
  Filled 2020-09-30 (×3): qty 300

## 2020-09-30 MED ORDER — ACETAMINOPHEN 325 MG PO TABS: 650.0000 mg | ORAL_TABLET | Freq: Four times a day (QID) | ORAL | Status: DC | PRN

## 2020-09-30 MED ORDER — ALBUTEROL SULFATE HFA 108 (90 BASE) MCG/ACT IN AERS: 2.0000 | INHALATION_SPRAY | Freq: Four times a day (QID) | RESPIRATORY_TRACT | Status: DC | PRN

## 2020-09-30 MED ORDER — GUAIFENESIN ER 600 MG PO TB12
600.0000 mg | ORAL_TABLET | Freq: Two times a day (BID) | ORAL | Status: DC
  Administered 2020-09-30: 600 mg via ORAL
  Filled 2020-09-30: qty 1

## 2020-09-30 MED ORDER — PANTOPRAZOLE 80MG IVPB - SIMPLE MED
80.0000 mg | Freq: Once | INTRAVENOUS | Status: AC
  Administered 2020-09-30: 80 mg via INTRAVENOUS
  Filled 2020-09-30: qty 80

## 2020-09-30 MED ORDER — METRONIDAZOLE 500 MG/100ML IV SOLN
500.0000 mg | Freq: Once | INTRAVENOUS | Status: AC
  Administered 2020-09-30: 500 mg via INTRAVENOUS
  Filled 2020-09-30: qty 100

## 2020-09-30 MED ORDER — NICOTINE 21 MG/24HR TD PT24
21.0000 mg | MEDICATED_PATCH | Freq: Every day | TRANSDERMAL | Status: DC
  Administered 2020-09-30: 21 mg via TRANSDERMAL
  Filled 2020-09-30: qty 1

## 2020-09-30 MED ORDER — MOMETASONE FURO-FORMOTEROL FUM 100-5 MCG/ACT IN AERO
2.0000 | INHALATION_SPRAY | Freq: Two times a day (BID) | RESPIRATORY_TRACT | Status: DC
  Administered 2020-09-30: 2 via RESPIRATORY_TRACT
  Filled 2020-09-30: qty 8.8

## 2020-09-30 MED ORDER — SODIUM CHLORIDE 0.9 % IV BOLUS
1000.0000 mL | Freq: Once | INTRAVENOUS | Status: AC
  Administered 2020-09-30: 1000 mL via INTRAVENOUS

## 2020-09-30 MED ORDER — BENZONATATE 100 MG PO CAPS
200.0000 mg | ORAL_CAPSULE | Freq: Two times a day (BID) | ORAL | Status: DC | PRN
  Administered 2020-09-30: 200 mg via ORAL
  Filled 2020-09-30: qty 2

## 2020-09-30 MED ORDER — HYDROMORPHONE HCL 1 MG/ML IJ SOLN: 0.5000 mg | INTRAMUSCULAR | Status: DC | PRN

## 2020-09-30 MED ORDER — HYDROCORTISONE NA SUCCINATE PF 100 MG IJ SOLR
100.0000 mg | Freq: Once | INTRAMUSCULAR | Status: AC
  Administered 2020-09-30: 100 mg via INTRAVENOUS
  Filled 2020-09-30: qty 2

## 2020-09-30 MED ORDER — IOHEXOL 350 MG/ML SOLN: 75.0000 mL | Freq: Once | INTRAVENOUS | Status: DC | PRN

## 2020-09-30 MED ORDER — ALBUTEROL SULFATE (2.5 MG/3ML) 0.083% IN NEBU: 2.5000 mg | INHALATION_SOLUTION | Freq: Four times a day (QID) | RESPIRATORY_TRACT | Status: DC | PRN

## 2020-09-30 MED ORDER — SODIUM CHLORIDE 0.9 % IV SOLN
10.0000 mL/h | Freq: Once | INTRAVENOUS | Status: AC
  Administered 2020-09-30: 10 mL/h via INTRAVENOUS

## 2020-09-30 MED ORDER — PANTOPRAZOLE SODIUM 40 MG IV SOLR
40.0000 mg | Freq: Two times a day (BID) | INTRAVENOUS | Status: DC
  Administered 2020-10-01 – 2020-10-03 (×7): 40 mg via INTRAVENOUS
  Filled 2020-09-30 (×7): qty 40

## 2020-09-30 MED ORDER — PANTOPRAZOLE SODIUM 40 MG IV SOLR: 40.0000 mg | Freq: Two times a day (BID) | INTRAVENOUS | Status: DC

## 2020-09-30 MED ORDER — LACTATED RINGERS IV BOLUS: 1000.0000 mL | Freq: Once | INTRAVENOUS | Status: DC

## 2020-09-30 MED ORDER — ONDANSETRON HCL 4 MG/2ML IJ SOLN
4.0000 mg | Freq: Once | INTRAMUSCULAR | Status: AC
  Administered 2020-09-30: 4 mg via INTRAVENOUS
  Filled 2020-09-30: qty 2

## 2020-09-30 MED ORDER — IOHEXOL 350 MG/ML SOLN
100.0000 mL | Freq: Once | INTRAVENOUS | Status: AC | PRN
  Administered 2020-09-30: 100 mL via INTRAVENOUS

## 2020-09-30 NOTE — ED Notes (Signed)
Internal medicine doctor at bedside explaining plan of care to pt. Pt will transfer to [facility] per pulmonologist recommendation.

## 2020-09-30 NOTE — H&P (Addendum)
History and Physical    Marcus [name] IWP:[id number] DOB: 06-27-1960 DOA: 09/30/2020  PCP: [name], FNP (Confirm with patient/family/NH records and if not entered, this has to be entered at [facility] point of entry) Patient coming from: home  I have personally briefly reviewed patient's old medical records in [place]  Chief Complaint: acute bleed with hemetemesis and rectal bleed  HPI: Marcus [name] is a 60 y.o. 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 Last admission 6/13 to 09/27/20 with persistent non-massive hemoptysis, persistent cavitary PNA with enlarging cavitation, neutropenia requiring Granix x 2. He was followed by pulmonary - Dr. Raul Del, ID- Dr. Delaine Lame. Per chart notes Dr. Raul Del had informed thoracic surgery of this patient's condition although no consult done as yet. At the time of d/c the patient was to continue Linezolid and flagyl to cover Provella (fungal) and was to have close follow up with pulmonary and ID. Of note patient does have a remote h/o of ulcer several years ago.  Patient was at home. Per brother he has not taken any medications since discharge. This AM he was found by his mother down on the floor in a pool of coffee ground emesis and frank blood. EMS activated and patient transported to [facility] for evaluation.    ED Course: T 97.7, initial BP 64/41 HR 123  RR 41 and patient appeared acutely ill. Resuscitation initiated with 2L LR bolus, 2u PRBCs, 1L NS. Lab revealed Hgb 8 (10.6 at discharge, (11.4 baseline before May '22) Cr 1.55 (0.39 09/27/20)Ca 7.1  AST 296, ALT 179, Lactic acid 9.1 which improved at 2 hrs to 8.2. 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 Patient seen by Dr. Haig Prophet for GI who did not see indication for  acute intervention. Patient was administered 80 mg IV protonix and infusion was started. Patient's vital signs improved: BP 140/96  HR 104  RR 23. Hgb rose to 8.9 g. TRH called to admit patient to progressive care for continued evaluation and management  Review of Systems: As per HPI otherwise 10 point review of systems negative. Weight loss in the interval from May to now - marked.    Past Medical History:  Diagnosis Date   COPD (chronic obstructive pulmonary disease) (HCC)    Felty syndrome (HCC)    Hernia, epigastric    Pancytopenia (HCC)    Seropositive rheumatoid arthritis ([place])    Tobacco use disorder     Past Surgical History:  Procedure Laterality Date   INCISIONAL HERNIA REPAIR  01/17/2020   Procedure: HERNIA REPAIR INCISIONAL AND Silvestre Gunner;  Surgeon: Kinsinger, Arta Bruce, MD;  Location: [facility];  Service: General;;   LAPAROTOMY N/A 01/17/2020   Procedure: EXPLORATORY LAPAROTOMY;  Surgeon: Mickeal Skinner, MD;  Location: [facility]S;  Service: General;  Laterality: N/A;    Soc Hx - married twice to the same woman and divorced a second time.No children. Lives with brother and mother. 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    reports that he has been smoking cigarettes. He has been smoking an average of 1.00 packs per day. He has never used smokeless tobacco. He reports current drug use. Drug: Marijuana. He reports that he does not drink alcohol.  Allergies  Allergen Reactions   Levaquin [Levofloxacin In D5w]  Other (See Comments)    Chest pain   Methotrexate Other (See Comments)    Chest pain    Family History  Problem Relation Age of Onset   CAD Brother    COPD Brother      Prior to Admission medications   Medication Sig Start Date End Date Taking? Authorizing Provider  albuterol (PROVENTIL) (2.5 MG/3ML) 0.083% nebulizer solution Take 3 mLs (2.5 mg total) by nebulization every 6 (six) hours as needed for wheezing or shortness of breath. 08/30/20    Kathie Dike, MD  albuterol (VENTOLIN HFA) 108 (90 Base) MCG/ACT inhaler Inhale 2 puffs into the lungs every 6 (six) hours as needed for wheezing or shortness of breath. 08/30/20   Kathie Dike, MD  benzonatate (TESSALON) 200 MG capsule Take 1 capsule (200 mg total) by mouth 2 (two) times daily as needed for up to 14 days for cough. 09/28/20 10/12/20  Nolberto Hanlon, MD  feeding supplement (ENSURE ENLIVE / ENSURE PLUS) LIQD Take 237 mLs by mouth 2 (two) times daily between meals. 08/04/20   Manuella Ghazi, Pratik D, DO  guaiFENesin (MUCINEX) 600 MG 12 hr tablet Take 1 tablet (600 mg total) by mouth 2 (two) times daily for 14 days. 09/28/20 10/12/20  Nolberto Hanlon, MD  linezolid (ZYVOX) 600 MG tablet Take 1 tablet (600 mg total) by mouth 2 (two) times daily for 21 days. 09/28/20 10/19/20  Nolberto Hanlon, MD  metroNIDAZOLE (FLAGYL) 500 MG tablet Take 1 tablet (500 mg total) by mouth 2 (two) times daily for 21 days. 09/28/20 10/19/20  Nolberto Hanlon, MD  mometasone-formoterol (DULERA) 100-5 MCG/ACT AERO Inhale 2 puffs into the lungs 2 (two) times daily. 08/30/20   Kathie Dike, MD  Multiple Vitamin (MULTIVITAMIN WITH MINERALS) TABS tablet Take 1 tablet by mouth daily. 09/29/20 10/29/20  Nolberto Hanlon, MD  mupirocin ointment (BACTROBAN) 2 % Place into the nose 2 (two) times daily. 09/28/20   Nolberto Hanlon, MD  nicotine (NICODERM CQ - DOSED IN MG/24 HOURS) 21 mg/24hr patch Place 1 patch (21 mg total) onto the skin daily for 28 days. 09/29/20 10/27/20  Nolberto Hanlon, MD  pantoprazole (PROTONIX) 40 MG tablet Take 1 tablet (40 mg total) by mouth daily for 10 days. 07/02/20 10/04/20  Manuella Ghazi, Pratik D, DO  predniSONE (DELTASONE) 10 MG tablet Take 1 tablet (10 mg total) by mouth 2 (two) times daily with a meal. 08/30/20   Kathie Dike, MD    Physical Exam: Vitals:   09/30/20 1300 09/30/20 1330 09/30/20 1400 09/30/20 1430  BP: 132/89 133/87 (!) 144/81 (!) 90/58  Pulse: 95 94 96 94  Resp: (!) 27 (!) 30 (!) 27 (!) 23  Temp:      TempSrc:       SpO2: 97% 95% 98% 98%  Weight:      Height:         Vitals:   09/30/20 1300 09/30/20 1330 09/30/20 1400 09/30/20 1430  BP: 132/89 133/87 (!) 144/81 (!) 90/58  Pulse: 95 94 96 94  Resp: (!) 27 (!) 30 (!) 27 (!) 23  Temp:      TempSrc:      SpO2: 97% 95% 98% 98%  Weight:      Height:       General: emaciated man in no distress Eyes: PERRL, lids and conjunctivae normal ENMT: Mucous membranes are dry. Dried blood in moustache and mouth. Neck: normal, supple, no masses, no thyromegaly Respiratory: Normal respiratory effort. No accessory muscle use. Decreased breath sounds. No wheezing,  no rales. Nl I/E ratio Cardiovascular: Regular rate and rhythm, heart sounds very distant and soft with no murmurs / rubs / gallops. No extremity edema. 2+ pedal pulses. No carotid bruits.  Abdomen: Scaphoid, easily reducible supra-umbilical hernia, no tenderness, no masses palpated. No hepatosplenomegaly. Bowel sounds positive.  Musculoskeletal: no clubbing / cyanosis. Interosseous wasting both hands. No joint deformity upper and lower extremities. Good ROM, no contractures. Decreased muscle mass and tone.  Skin: multiple bruises 2/2 in-patient care with IV's. NO traumatic lesions. Neurologic: CN 2-12 grossly intact. Strength 545 in all 4.  Psychiatric: Poor judgment and insight. Awake and oriented x 3. Very flat affect    Labs on Admission: I have personally reviewed following labs and imaging studies  CBC: Recent Labs  Lab 09/26/20 0425 09/27/20 0403 09/28/20 0518 09/30/20 0939 09/30/20 1225  WBC 10.6* 6.4 3.5* 7.3 8.2  NEUTROABS 5.7 2.8 1.0* 4.6 5.3  HGB 10.5* 10.4* 10.6* 8.0* 8.9*  HCT 33.2* 32.9* 33.8* 25.0* 25.8*  MCV 87.4 88.0 87.3 88.7 84.9  PLT 199 182 185 104* 77*   Basic Metabolic Panel: Recent Labs  Lab 09/24/20 0702 09/25/20 0416 09/27/20 0403 09/30/20 1053  NA 135  --  134* 136  K 5.0  --  4.9 5.4*  CL 94*  --  92* 101  CO2 35*  --  34* 18*  GLUCOSE 123*  --   106* 94  BUN 23*  --  28* 88*  CREATININE 0.56*  --  0.39* 1.55*  CALCIUM 8.3*  --  8.4* 7.1*  MG  --  2.2  --  2.0   GFR: Estimated Creatinine Clearance: 46.1 mL/min (A) (by C-G formula based on SCr of 1.55 mg/dL (H)). Liver Function Tests: Recent Labs  Lab 09/30/20 1053  AST 296*  ALT 179*  ALKPHOS 155*  BILITOT 1.0  PROT 5.0*  ALBUMIN 2.0*   No results for input(s): LIPASE, AMYLASE in the last 168 hours. No results for input(s): AMMONIA in the last 168 hours. Coagulation Profile: Recent Labs  Lab 09/30/20 0939  INR 1.6*   Cardiac Enzymes: No results for input(s): CKTOTAL, CKMB, CKMBINDEX, TROPONINI in the last 168 hours. BNP (last 3 results) No results for input(s): PROBNP in the last 8760 hours. HbA1C: No results for input(s): HGBA1C in the last 72 hours. CBG: No results for input(s): GLUCAP in the last 168 hours. Lipid Profile: No results for input(s): CHOL, HDL, LDLCALC, TRIG, CHOLHDL, LDLDIRECT in the last 72 hours. Thyroid Function Tests: No results for input(s): TSH, T4TOTAL, FREET4, T3FREE, THYROIDAB in the last 72 hours. Anemia Panel: No results for input(s): VITAMINB12, FOLATE, FERRITIN, TIBC, IRON, RETICCTPCT in the last 72 hours. Urine analysis:    Component Value Date/Time   COLORURINE YELLOW (A) 09/30/2020 1215   APPEARANCEUR HAZY (A) 09/30/2020 1215   LABSPEC 1.018 09/30/2020 1215   PHURINE 5.0 09/30/2020 1215   GLUCOSEU NEGATIVE 09/30/2020 1215   HGBUR SMALL (A) 09/30/2020 1215   BILIRUBINUR NEGATIVE 09/30/2020 1215   KETONESUR 5 (A) 09/30/2020 1215   PROTEINUR NEGATIVE 09/30/2020 1215   UROBILINOGEN 0.2 10/21/2012 1737   NITRITE NEGATIVE 09/30/2020 1215   LEUKOCYTESUR NEGATIVE 09/30/2020 1215    Radiological Exams on Admission: CT Angio Chest PE W and/or Wo Contrast  Result Date: 09/30/2020 CLINICAL DATA:  Hemoptysis, hematochezia, evaluate for GI bleed EXAM: CT ANGIOGRAPHY CHEST, ABDOMEN AND PELVIS TECHNIQUE: Non-contrast CT of the  abdomen and pelvis initially obtained. Multidetector CT imaging through the chest, abdomen and pelvis was  performed using the standard protocol during bolus administration of intravenous contrast. Multiplanar reconstructed images and MIPs were obtained and reviewed to evaluate the vascular anatomy. CONTRAST:  162mL OMNIPAQUE IOHEXOL 350 MG/ML SOLN COMPARISON:  09/17/2020 FINDINGS: CTA CHEST FINDINGS Cardiovascular: Preferential opacification of the thoracic aorta. Normal contour and caliber of the thoracic aorta. No evidence of aneurysm, dissection, or other acute aortic pathology. Scattered aortic atherosclerosis. No evidence of pulmonary embolism on this non tailored examination. Normal heart size. Three-vessel coronary artery calcifications. No pericardial effusion. Mediastinum/Nodes: No enlarged mediastinal, hilar, or axillary lymph nodes. Thyroid gland, trachea, and esophagus demonstrate no significant findings. Lungs/Pleura: Mild centrilobular emphysema. Diffuse bilateral bronchial wall thickening. Redemonstrated relatively thick-walled cavitary lesion of the anterior right upper lobe containing soft tissue attenuation debris and measuring approximately 8.4 x 5.7 cm (series 4, image 48). No pleural effusion or pneumothorax. Musculoskeletal: No chest wall abnormality. No acute or significant osseous findings. Review of the MIP images confirms the above findings. 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 No evidence of rupture, dissection, or other acute aortic pathology. Standard branching pattern of the abdominal aorta, with solitary bilateral renal arteries. Moderate mixed calcific atherosclerosis, with atherosclerosis at the branch vessel origins without high-grade stenosis. Review of the MIP images confirms the above findings. NON-VASCULAR Hepatobiliary: No solid liver abnormality is seen. Small gallstones in  the dependent gallbladder. Gallbladder wall thickening, or biliary dilatation. Pancreas: Unremarkable. No pancreatic ductal dilatation or surrounding inflammatory changes. Spleen: Normal in size without significant abnormality. Adrenals/Urinary Tract: Adrenal glands are unremarkable. Kidneys are normal, without renal calculi, solid lesion, or hydronephrosis. Bladder is unremarkable. Stomach/Bowel: Stomach is within normal limits. Appendix appears normal. No evidence of bowel wall thickening, distention, or inflammatory changes. There is contrast extravasation at the anus (series 6, image 85). Lymphatic: No enlarged abdominal or pelvic lymph nodes. Reproductive: No mass or other significant abnormality. Other: No abdominal wall hernia or abnormality. No abdominopelvic ascites. Musculoskeletal: No acute or significant osseous findings. Review of the MIP images confirms the above findings. IMPRESSION: 1. Redemonstrated, thick-walled cavitary lesion of the anterior right upper lobe containing soft tissue attenuation history. 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 Cavitary malignancy is a significant differential consideration. 2. There is contrast extravasation at the anus, consistent with hemorrhoidal bleeding or other very distal etiology of GI bleed. 3. 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 No evidence of rupture, dissection, or other acute aortic pathology. Recommend follow-up ultrasound every 3 years. This recommendation follows ACR consensus guidelines: White Paper of the ACR Incidental Findings Committee II on Vascular Findings. J Am Coll Radiol 2013; 10:789-794. 4. Emphysema. 5. Diffuse bilateral bronchial wall thickening, consistent with nonspecific infectious or inflammatory bronchitis. 6. Coronary artery disease. 7. Cholelithiasis. Aortic Atherosclerosis (ICD10-I70.0) and Emphysema (ICD10-J43.9).  Electronically Signed   By: Eddie Candle M.D.   On: 09/30/2020 11:51   DG Chest Portable 1 View  Result Date: 09/30/2020 CLINICAL DATA:  Hemoptysis, GI bleed. EXAM: PORTABLE CHEST 1 VIEW COMPARISON:  September 25, 2020. FINDINGS: EKG leads project over the chest. Persistent opacity in the RIGHT mid chest in the area of the cavitary focus that was seen on prior CT imaging. Unchanged compared to September 25, 2020. No new areas of lobar consolidative changes. No pneumothorax.  No pleural effusion on frontal radiograph. No acute bone finding on limited assessment.  IMPRESSION: 1. Persistent opacity in the RIGHT mid chest in the area of the cavitary focus that was seen on prior CT imaging. 2. Essentially no change from recent comparison. Electronically Signed   By: Zetta Bills M.D.   On: 09/30/2020 10:57   CT Angio Abd/Pel W and/or Wo Contrast  Result Date: 09/30/2020 CLINICAL DATA:  Hemoptysis, hematochezia, evaluate for GI bleed EXAM: CT ANGIOGRAPHY CHEST, ABDOMEN AND PELVIS TECHNIQUE: Non-contrast CT of the abdomen and pelvis initially obtained. Multidetector CT imaging through the chest, abdomen and pelvis was performed using the standard protocol during bolus administration of intravenous contrast. Multiplanar reconstructed images and MIPs were obtained and reviewed to evaluate the vascular anatomy. CONTRAST:  180mL OMNIPAQUE IOHEXOL 350 MG/ML SOLN COMPARISON:  09/17/2020 FINDINGS: CTA CHEST FINDINGS Cardiovascular: Preferential opacification of the thoracic aorta. Normal contour and caliber of the thoracic aorta. No evidence of aneurysm, dissection, or other acute aortic pathology. Scattered aortic atherosclerosis. No evidence of pulmonary embolism on this non tailored examination. Normal heart size. Three-vessel coronary artery calcifications. No pericardial effusion. Mediastinum/Nodes: No enlarged mediastinal, hilar, or axillary lymph nodes. Thyroid gland, trachea, and esophagus demonstrate no significant  findings. Lungs/Pleura: Mild centrilobular emphysema. Diffuse bilateral bronchial wall thickening. Redemonstrated relatively thick-walled cavitary lesion of the anterior right upper lobe containing soft tissue attenuation debris and measuring approximately 8.4 x 5.7 cm (series 4, image 48). No pleural effusion or pneumothorax. Musculoskeletal: No chest wall abnormality. No acute or significant osseous findings. Review of the MIP images confirms the above findings. 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 No evidence of rupture, dissection, or other acute aortic pathology. Standard branching pattern of the abdominal aorta, with solitary bilateral renal arteries. Moderate mixed calcific atherosclerosis, with atherosclerosis at the branch vessel origins without high-grade stenosis. Review of the MIP images confirms the above findings. NON-VASCULAR Hepatobiliary: No solid liver abnormality is seen. Small gallstones in the dependent gallbladder. Gallbladder wall thickening, or biliary dilatation. Pancreas: Unremarkable. No pancreatic ductal dilatation or surrounding inflammatory changes. Spleen: Normal in size without significant abnormality. Adrenals/Urinary Tract: Adrenal glands are unremarkable. Kidneys are normal, without renal calculi, solid lesion, or hydronephrosis. Bladder is unremarkable. Stomach/Bowel: Stomach is within normal limits. Appendix appears normal. No evidence of bowel wall thickening, distention, or inflammatory changes. There is contrast extravasation at the anus (series 6, image 85). Lymphatic: No enlarged abdominal or pelvic lymph nodes. Reproductive: No mass or other significant abnormality. Other: No abdominal wall hernia or abnormality. No abdominopelvic ascites. Musculoskeletal: No acute or significant osseous findings. Review of the MIP images confirms the above findings. IMPRESSION: 1.  Redemonstrated, thick-walled cavitary lesion of the anterior right upper lobe containing soft tissue attenuation history. 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 Cavitary malignancy is a significant differential consideration. 2. There is contrast extravasation at the anus, consistent with hemorrhoidal bleeding or other very distal etiology of GI bleed. 3. 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 No evidence of rupture, dissection, or other acute aortic pathology. Recommend follow-up ultrasound every 3 years. This recommendation follows ACR consensus guidelines: White Paper of the ACR Incidental Findings Committee II on Vascular Findings. J Am Coll Radiol 2013; 10:789-794. 4. Emphysema. 5. Diffuse bilateral bronchial wall thickening, consistent with nonspecific infectious or inflammatory bronchitis. 6. Coronary artery disease. 7. Cholelithiasis. Aortic Atherosclerosis (ICD10-I70.0) and Emphysema (ICD10-J43.9). Electronically Signed   By:  Eddie Candle M.D.   On: 09/30/2020 11:51    EKG: Independently reviewed. Sinus tachycardia, RBBB, LAFB  ?inferior MI  Assessment/Plan Active Problems:   Massive hemoptysis   AKI (acute kidney injury) ([place])   Abnormal EKG   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HCC)   Chronic obstructive pulmonary disease (HCC)   Chronic diastolic CHF (congestive heart failure) (HCC)   Cavitary pneumonia   Moderate protein-calorie malnutrition (HCC)   Seropositive rheumatoid arthritis (HCC)  (please populate well all problems here in Problem List. (For example, if patient is on BP    Massive hemoptysis - patient with hemoptysis since May, always non-massive and thought related to cavitary PNA. Now with 2.6 g drop in Hgb with coffee ground emesis and frank blood. Patient admits to hemoptysis and he knows he has swallowed a lot of fluid from coughing. Fluid resuscitated and transfused 2 uPRBCs in ED  with final Hgb 8.9  Plan Progressive care admission  Continue IVF  H/H q 6 with transfusion threshold of Hgb < 8   Protonix infusion to cover for any potential GI source of blood loss -GI involved.  Pulmonary consult - secure chat sent to Dr. Laural Roes.   May need thoracic surgery consult/intervention  2. Cavitary PNA - MRSA - well established dx. Slow to respond to treatment. No leukocytosis from baseline. Had received Granix recently 2/2 Felty's syndrome and immunosuppresion. NO fever.  Plan Continue Linezolid and flagyl as prescribed at d/c  ID - Dr. Gwenevere Ghazi team notified  May need thoracic surgery consult for failure to respond to convervative ID treatment  3. AKI - marked jump in Creatinine most likely prerenal. Patient has received 3L IVF.  Plan Maintenance IVF  F/u Bmet  4. COPD - no wheezing, nl I/E ratio  Plan Continue home regimen  5. Chronic HFpEF - last ECHO 08/28/20 EF 60-73%, grade I diastolic dysfxn. No evidence of decompensation.  Plan Continue home meds  6. RA - no acute joint flare  Plan Continue oral prednisone  7. Protein-calorie malnutrition - continue supplemental nutrition  8. Abnormal EKG - EKG with RBBB, LVFB now machine reads acute MI although there is a lot of electrical interference in study  Plan Cycled cardiac enzymes. #1 74, #2 68  no further eval indicated  9. [place] - patient had palliative care consult during last admission and desire full code status and all possible treatments.    DVT prophylaxis: SCDs  Code Status: full code  Family Communication: brother present during interview and exam. Understands Dx and Tx plan and also the severity of the situation.   Disposition Plan: TBD  Consults called: Pulmonary - Dr. Jannetta Quint. Raul Del; ID - Dr. Steva Ready  Admission status: inpatient/progressive care unity    Adella Hare MD [facility] Pager [phone]  If 7PM-7AM, please contact  night-coverage [email or web address] Password [facility]  09/30/2020, 3:36 PM

## 2020-09-30 NOTE — ED Notes (Signed)
EDP obtaining ultrasound IV access.

## 2020-09-30 NOTE — ED Notes (Signed)
Second unit of emergency blood complete.

## 2020-09-30 NOTE — ED Provider Notes (Addendum)
[facility] Emergency Department Provider Note ____________________________________________   Event Date/Time   First MD Initiated Contact with Patient 09/30/20 [phone]     (approximate)  I have reviewed the triage vital signs and the nursing notes.  HISTORY  Chief Complaint GI Bleeding and Fall   HPI Marcus [name] is a 60 y.o. malewho presents to the ED for evaluation of Gi bleeding.   Chart review indicates just discharged 2 days ago after medical admission spanning 12 days for hemoptysis and COPD exacerbation.  He has a right upper lobe cavitary mass 10 cm in diameter.  Treated for cavitary pneumonia.  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  Patient presents to the ED via EMS from home due to GI bleeding.  EMS reports quite a mess at his home with bloody diarrhea and bloody emesis throughout much of his home.  Patient's mother, who he lives with, reportedly found him this morning laying in his own bloody vomit and called 911.  Patient reports he has not been taking any of his medications and reports significant apathy.  Denies any active measures to harm himself.  Patient continually responds with "I not know, I do not care."  History somewhat limited due to his refusal to participate and acuity of condition.  Presents tachycardic and hypotensive, covered in bloody emesis.   Past Medical History:  Diagnosis Date   COPD (chronic obstructive pulmonary disease) (HCC)    Felty syndrome (HCC)    Hernia, epigastric    Pancytopenia (HCC)    Seropositive rheumatoid arthritis ([place])    Tobacco use disorder     Patient Active Problem List   Diagnosis Date Noted   Protein-calorie malnutrition, severe 09/19/2020   Lung abscess ([place]) 09/18/2020   Respiratory failure with hypoxia ([place]) 09/17/2020   Hemoptysis 09/17/2020   Acquired neutropenia ([place]) 08/28/2020   Hypothermia 08/24/2020   Cavitary pneumonia 08/22/2020   Acute on chronic respiratory  failure with hypoxia ([place]) 08/22/2020   Hyponatremia 08/04/2020   Generalized weakness 08/04/2020   Dehydration 08/03/2020   Septic shock ([place]) 06/29/2020   CAP (community acquired pneumonia) 06/29/2020   COPD exacerbation ([place]) 06/11/2020   Chest pain 06/11/2020   Cellulitis 06/11/2020   Chronic diastolic CHF (congestive heart failure) ([place]) 06/11/2020   Sepsis due to undetermined organism ([place]) 01/20/2020   Acute hypoxemic respiratory failure ([place]) 01/20/2020   Hypokalemia 01/20/2020   Perforated prepyloric ulcer 01/18/2020 01/18/2020   Pneumoperitoneum 01/17/2020   COPD with acute exacerbation ([place]) 08/18/2019   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   Anemia 11/14/2015   Seropositive rheumatoid arthritis ([place])    Chronic obstructive pulmonary disease ([place])    Moderate protein-calorie malnutrition ([place]) 07/24/2015   Pancytopenia ([place]) 07/23/2015   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[place]) 06/06/2013   Leukopenia 10/21/2012   Axillary abscess 10/21/2012   Abnormal EKG 10/21/2012   Joint pain 10/21/2012   Splenomegaly 10/21/2012    Past Surgical History:  Procedure Laterality Date   INCISIONAL HERNIA REPAIR  01/17/2020   Procedure: HERNIA REPAIR INCISIONAL AND Silvestre Gunner;  Surgeon: Kinsinger, Arta Bruce, MD;  Location: [facility] ORS;  Service: General;;   LAPAROTOMY N/A 01/17/2020   Procedure: EXPLORATORY LAPAROTOMY;  Surgeon: Mickeal Skinner, MD;  Location: [facility] ORS;  Service: General;  Laterality: N/A;    Prior to Admission medications   Medication Sig Start Date End Date Taking? Authorizing Provider  albuterol (PROVENTIL) (2.5 MG/3ML) 0.083% nebulizer solution Take 3 mLs (2.5 mg total) by nebulization every 6 (six) hours  as needed for wheezing or shortness of breath. 08/30/20   Kathie Dike, MD  albuterol (VENTOLIN HFA) 108 (90 Base) MCG/ACT inhaler Inhale 2 puffs into the lungs every 6 (six) hours as needed for wheezing or shortness of breath. 08/30/20   Kathie Dike, MD  benzonatate (TESSALON)  200 MG capsule Take 1 capsule (200 mg total) by mouth 2 (two) times daily as needed for up to 14 days for cough. 09/28/20 10/12/20  Nolberto Hanlon, MD  feeding supplement (ENSURE ENLIVE / ENSURE PLUS) LIQD Take 237 mLs by mouth 2 (two) times daily between meals. 08/04/20   Manuella Ghazi, Pratik D, DO  guaiFENesin (MUCINEX) 600 MG 12 hr tablet Take 1 tablet (600 mg total) by mouth 2 (two) times daily for 14 days. 09/28/20 10/12/20  Nolberto Hanlon, MD  linezolid (ZYVOX) 600 MG tablet Take 1 tablet (600 mg total) by mouth 2 (two) times daily for 21 days. 09/28/20 10/19/20  Nolberto Hanlon, MD  metroNIDAZOLE (FLAGYL) 500 MG tablet Take 1 tablet (500 mg total) by mouth 2 (two) times daily for 21 days. 09/28/20 10/19/20  Nolberto Hanlon, MD  mometasone-formoterol (DULERA) 100-5 MCG/ACT AERO Inhale 2 puffs into the lungs 2 (two) times daily. 08/30/20   Kathie Dike, MD  Multiple Vitamin (MULTIVITAMIN WITH MINERALS) TABS tablet Take 1 tablet by mouth daily. 09/29/20 10/29/20  Nolberto Hanlon, MD  mupirocin ointment (BACTROBAN) 2 % Place into the nose 2 (two) times daily. 09/28/20   Nolberto Hanlon, MD  nicotine (NICODERM CQ - DOSED IN MG/24 HOURS) 21 mg/24hr patch Place 1 patch (21 mg total) onto the skin daily for 28 days. 09/29/20 10/27/20  Nolberto Hanlon, MD  pantoprazole (PROTONIX) 40 MG tablet Take 1 tablet (40 mg total) by mouth daily for 10 days. 07/02/20 10/04/20  Manuella Ghazi, Pratik D, DO  predniSONE (DELTASONE) 10 MG tablet Take 1 tablet (10 mg total) by mouth 2 (two) times daily with a meal. 08/30/20   Kathie Dike, MD    Allergies Levaquin [levofloxacin in d5w] and Methotrexate  Family History  Problem Relation Age of Onset   CAD Brother    COPD Brother     Social History Social History   Tobacco Use   Smoking status: Every Day    Packs/day: 1.00    Pack years: 0.00    Types: Cigarettes   Smokeless tobacco: Never  Substance Use Topics   Alcohol use: No    Alcohol/week: 0.0 standard drinks   Drug use: Yes    Types: Marijuana     Review of Systems  Unable to be accurately assessed due to patient's poor participation and acuity of condition. ____________________________________________   PHYSICAL EXAM:  VITAL SIGNS: Vitals:   09/30/20 1210 09/30/20 1230  BP: 125/69 140/69  Pulse: (!) 102 (!) 104  Resp: (!) 21 (!) 23  Temp:    SpO2: 100% 98%    Constitutional: Alert and oriented. Sitting up in bed, head slumped. Responds to questions, and follows commands in all 4 extremities.  Covered in blood, mostly bright red around his face, neck and shirt. Eyes: Conjunctivae are normal. PERRL. EOMI. Head: Atraumatic. Nose: No congestion/rhinnorhea. Mouth/Throat: Mucous membranes are dry.  Oropharynx non-erythematous. Neck: No stridor. No cervical spine tenderness to palpation. Cardiovascular: Tachycardic rate, regular rhythm. Grossly normal heart sounds.  Good peripheral circulation.  Palpable DP pulses bilaterally Respiratory: Tachypneic to the mid 20s.  No distress.  No retractions. Lungs CTAB. Gastrointestinal: Soft , nondistended. No CVA tenderness. Diffuse tenderness without peritoneal features.  Melena  between his legs. Musculoskeletal: No lower extremity tenderness   No joint effusions. No signs of acute trauma. Neurologic:  Normal speech and language. No gross focal neurologic deficits are appreciated.  Skin:  Skin is warm, dry and intact. No rash noted. Psychiatric: Mood and affect are flat ____________________________________________   LABS (all labs ordered are listed, but only abnormal results are displayed)  Labs Reviewed  CBC WITH DIFFERENTIAL/PLATELET - Abnormal; Notable for the following components:      Result Value   RBC 2.82 (*)    Hemoglobin 8.0 (*)    HCT 25.0 (*)    RDW 22.0 (*)    Platelets 104 (*)    nRBC 2.6 (*)    Abs Immature Granulocytes 0.24 (*)    All other components within normal limits  URINALYSIS, COMPLETE (UACMP) WITH MICROSCOPIC - Abnormal; Notable for the following  components:   Color, Urine YELLOW (*)    APPearance HAZY (*)    Hgb urine dipstick SMALL (*)    Ketones, ur 5 (*)    All other components within normal limits  PROTIME-INR - Abnormal; Notable for the following components:   Prothrombin Time 19.1 (*)    INR 1.6 (*)    All other components within normal limits  APTT - Abnormal; Notable for the following components:   aPTT 40 (*)    All other components within normal limits  LACTIC ACID, PLASMA - Abnormal; Notable for the following components:   Lactic Acid, Venous 9.1 (*)    All other components within normal limits  LACTIC ACID, PLASMA - Abnormal; Notable for the following components:   Lactic Acid, Venous 8.2 (*)    All other components within normal limits  COMPREHENSIVE METABOLIC PANEL - Abnormal; Notable for the following components:   Potassium 5.4 (*)    CO2 18 (*)    BUN 88 (*)    Creatinine, Ser 1.55 (*)    Calcium 7.1 (*)    Total Protein 5.0 (*)    Albumin 2.0 (*)    AST 296 (*)    ALT 179 (*)    Alkaline Phosphatase 155 (*)    GFR, Estimated 51 (*)    Anion gap 17 (*)    All other components within normal limits  CBC WITH DIFFERENTIAL/PLATELET - Abnormal; Notable for the following components:   RBC 3.04 (*)    Hemoglobin 8.9 (*)    HCT 25.8 (*)    RDW 17.7 (*)    Platelets 77 (*)    nRBC 2.2 (*)    Monocytes Absolute 1.3 (*)    Abs Immature Granulocytes 0.21 (*)    All other components within normal limits  TROPONIN I (HIGH SENSITIVITY) - Abnormal; Notable for the following components:   Troponin I (High Sensitivity) 78 (*)    All other components within normal limits  TROPONIN I (HIGH SENSITIVITY) - Abnormal; Notable for the following components:   Troponin I (High Sensitivity) 64 (*)    All other components within normal limits  RESP PANEL BY RT-PCR (FLU A&B, COVID) ARPGX2  CULTURE, BLOOD (ROUTINE X 2)  CULTURE, BLOOD (ROUTINE X 2)  MAGNESIUM  PREPARE RBC (CROSSMATCH)  TYPE AND SCREEN    ____________________________________________  12 Lead EKG  Sinus tachycardia, rate of 118 bpm.  Normal axis.  Right bundle and left anterior fascicular blocks without evidence of acute ischemia. ____________________________________________  RADIOLOGY  ED MD interpretation: CXR reviewed by me with right hilar cavitary opacity.  Official radiology report(s):  CT Angio Chest PE W and/or Wo Contrast  Result Date: 09/30/2020 CLINICAL DATA:  Hemoptysis, hematochezia, evaluate for GI bleed EXAM: CT ANGIOGRAPHY CHEST, ABDOMEN AND PELVIS TECHNIQUE: Non-contrast CT of the abdomen and pelvis initially obtained. Multidetector CT imaging through the chest, abdomen and pelvis was performed using the standard protocol during bolus administration of intravenous contrast. Multiplanar reconstructed images and MIPs were obtained and reviewed to evaluate the vascular anatomy. CONTRAST:  154mL OMNIPAQUE IOHEXOL 350 MG/ML SOLN COMPARISON:  09/17/2020 FINDINGS: CTA CHEST FINDINGS Cardiovascular: Preferential opacification of the thoracic aorta. Normal contour and caliber of the thoracic aorta. No evidence of aneurysm, dissection, or other acute aortic pathology. Scattered aortic atherosclerosis. No evidence of pulmonary embolism on this non tailored examination. Normal heart size. Three-vessel coronary artery calcifications. No pericardial effusion. Mediastinum/Nodes: No enlarged mediastinal, hilar, or axillary lymph nodes. Thyroid gland, trachea, and esophagus demonstrate no significant findings. Lungs/Pleura: Mild centrilobular emphysema. Diffuse bilateral bronchial wall thickening. Redemonstrated relatively thick-walled cavitary lesion of the anterior right upper lobe containing soft tissue attenuation debris and measuring approximately 8.4 x 5.7 cm (series 4, image 48). No pleural effusion or pneumothorax. Musculoskeletal: No chest wall abnormality. No acute or significant osseous findings. Review of the MIP images  confirms the above findings. 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 No evidence of rupture, dissection, or other acute aortic pathology. Standard branching pattern of the abdominal aorta, with solitary bilateral renal arteries. Moderate mixed calcific atherosclerosis, with atherosclerosis at the branch vessel origins without high-grade stenosis. Review of the MIP images confirms the above findings. NON-VASCULAR Hepatobiliary: No solid liver abnormality is seen. Small gallstones in the dependent gallbladder. Gallbladder wall thickening, or biliary dilatation. Pancreas: Unremarkable. No pancreatic ductal dilatation or surrounding inflammatory changes. Spleen: Normal in size without significant abnormality. Adrenals/Urinary Tract: Adrenal glands are unremarkable. Kidneys are normal, without renal calculi, solid lesion, or hydronephrosis. Bladder is unremarkable. Stomach/Bowel: Stomach is within normal limits. Appendix appears normal. No evidence of bowel wall thickening, distention, or inflammatory changes. There is contrast extravasation at the anus (series 6, image 85). Lymphatic: No enlarged abdominal or pelvic lymph nodes. Reproductive: No mass or other significant abnormality. Other: No abdominal wall hernia or abnormality. No abdominopelvic ascites. Musculoskeletal: No acute or significant osseous findings. Review of the MIP images confirms the above findings. IMPRESSION: 1. Redemonstrated, thick-walled cavitary lesion of the anterior right upper lobe containing soft tissue attenuation history. 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 Cavitary malignancy is a significant differential consideration. 2. There is contrast extravasation at the anus, consistent with hemorrhoidal bleeding or other very distal etiology of GI bleed. 3. 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 No evidence of rupture, dissection, or other acute aortic pathology. Recommend follow-up ultrasound every 3 years. This recommendation follows ACR consensus guidelines: White Paper of the ACR Incidental Findings Committee II on Vascular Findings. J Am Coll Radiol 2013; 10:789-794. 4. Emphysema. 5. Diffuse bilateral bronchial wall thickening, consistent with nonspecific infectious or inflammatory bronchitis. 6. Coronary artery disease. 7. Cholelithiasis. Aortic Atherosclerosis (ICD10-I70.0) and Emphysema (ICD10-J43.9). Electronically Signed   By: Eddie Candle M.D.   On: 09/30/2020 11:51   DG Chest Portable 1 View  Result Date: 09/30/2020 CLINICAL DATA:  Hemoptysis, GI bleed. EXAM: PORTABLE CHEST 1 VIEW COMPARISON:  September 25, 2020. FINDINGS: EKG leads project over the chest. Persistent opacity  in the RIGHT mid chest in the area of the cavitary focus that was seen on prior CT imaging. Unchanged compared to September 25, 2020. No new areas of lobar consolidative changes. No pneumothorax.  No pleural effusion on frontal radiograph. No acute bone finding on limited assessment. IMPRESSION: 1. Persistent opacity in the RIGHT mid chest in the area of the cavitary focus that was seen on prior CT imaging. 2. Essentially no change from recent comparison. Electronically Signed   By: Zetta Bills M.D.   On: 09/30/2020 10:57   CT Angio Abd/Pel W and/or Wo Contrast  Result Date: 09/30/2020 CLINICAL DATA:  Hemoptysis, hematochezia, evaluate for GI bleed EXAM: CT ANGIOGRAPHY CHEST, ABDOMEN AND PELVIS TECHNIQUE: Non-contrast CT of the abdomen and pelvis initially obtained. Multidetector CT imaging through the chest, abdomen and pelvis was performed using the standard protocol during bolus administration of intravenous contrast. Multiplanar reconstructed images and MIPs were obtained and reviewed to evaluate the vascular anatomy. CONTRAST:  125mL OMNIPAQUE IOHEXOL 350 MG/ML  SOLN COMPARISON:  09/17/2020 FINDINGS: CTA CHEST FINDINGS Cardiovascular: Preferential opacification of the thoracic aorta. Normal contour and caliber of the thoracic aorta. No evidence of aneurysm, dissection, or other acute aortic pathology. Scattered aortic atherosclerosis. No evidence of pulmonary embolism on this non tailored examination. Normal heart size. Three-vessel coronary artery calcifications. No pericardial effusion. Mediastinum/Nodes: No enlarged mediastinal, hilar, or axillary lymph nodes. Thyroid gland, trachea, and esophagus demonstrate no significant findings. Lungs/Pleura: Mild centrilobular emphysema. Diffuse bilateral bronchial wall thickening. Redemonstrated relatively thick-walled cavitary lesion of the anterior right upper lobe containing soft tissue attenuation debris and measuring approximately 8.4 x 5.7 cm (series 4, image 48). No pleural effusion or pneumothorax. Musculoskeletal: No chest wall abnormality. No acute or significant osseous findings. Review of the MIP images confirms the above findings. 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 No evidence of rupture, dissection, or other acute aortic pathology. Standard branching pattern of the abdominal aorta, with solitary bilateral renal arteries. Moderate mixed calcific atherosclerosis, with atherosclerosis at the branch vessel origins without high-grade stenosis. Review of the MIP images confirms the above findings. NON-VASCULAR Hepatobiliary: No solid liver abnormality is seen. Small gallstones in the dependent gallbladder. Gallbladder wall thickening, or biliary dilatation. Pancreas: Unremarkable. No pancreatic ductal dilatation or surrounding inflammatory changes. Spleen: Normal in size without significant abnormality. Adrenals/Urinary Tract: Adrenal glands are unremarkable. Kidneys are normal, without renal calculi, solid lesion, or  hydronephrosis. Bladder is unremarkable. Stomach/Bowel: Stomach is within normal limits. Appendix appears normal. No evidence of bowel wall thickening, distention, or inflammatory changes. There is contrast extravasation at the anus (series 6, image 85). Lymphatic: No enlarged abdominal or pelvic lymph nodes. Reproductive: No mass or other significant abnormality. Other: No abdominal wall hernia or abnormality. No abdominopelvic ascites. Musculoskeletal: No acute or significant osseous findings. Review of the MIP images confirms the above findings. IMPRESSION: 1. Redemonstrated, thick-walled cavitary lesion of the anterior right upper lobe containing soft tissue attenuation history. 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 Cavitary malignancy is a significant differential consideration. 2. There is contrast extravasation at the anus, consistent with hemorrhoidal bleeding or other very distal etiology of GI bleed. 3. 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 No evidence of rupture, dissection, or other acute aortic pathology. Recommend follow-up ultrasound every 3 years. This recommendation follows ACR consensus guidelines: White Paper  of the ACR Incidental Findings Committee II on Vascular Findings. J Am Coll Radiol 2013; 10:789-794. 4. Emphysema. 5. Diffuse bilateral bronchial wall thickening, consistent with nonspecific infectious or inflammatory bronchitis. 6. Coronary artery disease. 7. Cholelithiasis. Aortic Atherosclerosis (ICD10-I70.0) and Emphysema (ICD10-J43.9). Electronically Signed   By: Eddie Candle M.D.   On: 09/30/2020 11:51    ____________________________________________   PROCEDURES and INTERVENTIONS  Procedure(s) performed (including Critical Care):  .1-3 Lead EKG Interpretation  Date/Time: 09/30/2020 1:06 PM Performed by: Vladimir Crofts, MD Authorized by: Vladimir Crofts, MD      Interpretation: abnormal     ECG rate:  120   ECG rate assessment: tachycardic     Rhythm: sinus tachycardia     Ectopy: none     Conduction: normal   .Critical Care  Date/Time: 09/30/2020 1:06 PM Performed by: Vladimir Crofts, MD Authorized by: Vladimir Crofts, MD   Critical care provider statement:    Critical care time (minutes):  75   Critical care was necessary to treat or prevent imminent or life-threatening deterioration of the following conditions:  Shock, circulatory failure and dehydration   Critical care was time spent personally by me on the following activities:  Discussions with consultants, evaluation of patient's response to treatment, examination of patient, ordering and performing treatments and interventions, ordering and review of laboratory studies, ordering and review of radiographic studies, pulse oximetry, re-evaluation of patient's condition, obtaining history from patient or surrogate and review of old charts Ultrasound ED Peripheral IV (Provider)  Date/Time: 09/30/2020 1:17 PM Performed by: Vladimir Crofts, MD Authorized by: Vladimir Crofts, MD   Procedure details:    Indications: hypotension and poor IV access     Skin Prep: chlorhexidine gluconate     Location: right basilic v.   Angiocath:  18 G   Bedside Ultrasound Guided: Yes     Images: not archived     Patient tolerated procedure without complications: Yes     Dressing applied: Yes    Medications  pantoprozole (PROTONIX) 80 mg /NS 100 mL infusion (8 mg/hr Intravenous New Bag/Given 09/30/20 1143)  pantoprazole (PROTONIX) injection 40 mg (has no administration in time range)  linezolid (ZYVOX) IVPB 600 mg (has no administration in time range)  sodium chloride 0.9 % bolus 1,000 mL (0 mLs Intravenous Stopped 09/30/20 1037)  0.9 %  sodium chloride infusion (0 mL/hr Intravenous Stopped 09/30/20 1038)  pantoprazole (PROTONIX) 80 mg /NS 100 mL IVPB (0 mg Intravenous Stopped 09/30/20 1100)  ondansetron (ZOFRAN) injection  4 mg (4 mg Intravenous Given 09/30/20 1031)  lactated ringers bolus 1,000 mL (0 mLs Intravenous Stopped 09/30/20 1210)  hydrocortisone sodium succinate (SOLU-CORTEF) 100 MG injection 100 mg (100 mg Intravenous Given 09/30/20 1142)  iohexol (OMNIPAQUE) 350 MG/ML injection 100 mL (100 mLs Intravenous Contrast Given 09/30/20 1124)  lactated ringers bolus 1,000 mL (1,000 mLs Intravenous New Bag/Given 09/30/20 1218)  metroNIDAZOLE (FLAGYL) IVPB 500 mg (500 mg Intravenous New Bag/Given 09/30/20 1231)    ____________________________________________   MDM / ED COURSE   60 year old male being treated for cavitary pneumonia presents to the ED covered in blood, initially concerning for GI bleed, possibly all due to worsening hemoptysis from his cavitary pneumonia, and ultimately requiring medical admission.  He presents unstable, tachycardic and hypotensive, covered in blood and not providing significant history.  Airway remains intact and no immediate indications for emergent intubation.  Initiated rapid resuscitation with fluids and blood products with improvement of his hemodynamics and ultimately never required pressors.  His first hemoglobin  was 8, but I do not trust this considering his presentation and hemodynamics.  Recheck after blood product administration noted to be 8.9.  Lactic acidosis improving with resuscitation.  AKI is noted and he received 3 L of IV fluids while in the ED.  I discussed the case with GI, who recommends CT imaging, which is noted above.  May have very distal internal hemorrhoid bleeding, but no other significant GI pathology. Further history obtained by patient's brother and the patient himself as his mental status improves, indicates that he really has been coughing a lot and has been choking and swallowing a lot of blood from his coughing, likely producing his melena.  In retrospect, his bleeding may all have been hemoptysis in etiology.  He remained stable in the ED without further  episodes of GI bleeding or significant hemoptysis.  Sounds like he has not been taking his antibiotics at home, so we ordered the same linezolid and metronidazole.  We will discuss the case with hospitalist for admission.  Clinical Course as of 10/01/20 1639  Sun Sep 30, 2020  0914 First BP is 60/40.  Tachycardic.  Ordered 2 units of emergent release blood. [DS]  5038 USIV 18g placed by me. Good peripheral access.  BP remains low and heart rate.  Emergency release blood now in the room and being started.  Mentation and airway remains adequate. [DS]  8828 [id number].  BP normalized.  Remains tachycardic. [DS]  0569 I discuss the case with Dr. Haig Prophet, who agrees to see the patient in evaluation [DS]  1046 Dr. Haig Prophet has seen the patient and recommends CTA chest. No abd imaging at this time. He will continue to follow.   I talk with our ICU doc, and we have no available beds due to limited nursing staff [DS]  1219 Reassessed.  BP improving.  MAP of 85. [DS]  1227 Discuss case with Dr. Haig Prophet, no immediate plans for scoping. Recommends medical admit [DS]  1302 I discuss the case with Dr. Linda Hedges, who agrees to see the patient for admission [DS]    Clinical Course User Index [DS] Vladimir Crofts, MD    ____________________________________________   FINAL CLINICAL IMPRESSION(S) / ED DIAGNOSES  Final diagnoses:  Hemorrhagic shock ([place])  Hemoptysis  AKI (acute kidney injury) [facility])     ED Discharge Orders     None        Tashanti Dalporto   Note:  This document was prepared using Dragon voice recognition software and may include unintentional dictation errors.    Vladimir Crofts, MD 09/30/20 1338    Vladimir Crofts, MD 10/01/20 [phone]

## 2020-09-30 NOTE — ED Notes (Signed)
Pt cleaned up and placed in a brief and clean hospital gown. Report given to [facility] - pt stable.

## 2020-09-30 NOTE — ED Notes (Signed)
Called pharmacy to ask about missing dose of linezolid IVPB. ED tube station is down.

## 2020-09-30 NOTE — ED Notes (Signed)
Attending at bedside. Pt has diet order now; provided ice chips and ice water per pt request.

## 2020-09-30 NOTE — ED Notes (Signed)
Pt in CT.  Blood bank called to let nurse know that emergency blood 2 units that were given this morning are compatible with pt blood type.

## 2020-09-30 NOTE — ED Notes (Signed)
EDP at bedside, son at bedside. EDP talking with son and pt.

## 2020-09-30 NOTE — ED Notes (Signed)
Blood bank called. Type and screen hemolyzed. They will come recollect.

## 2020-09-30 NOTE — ED Notes (Signed)
!  st unit of emergency blood complete. 370 mL infused. Second unit still hanging.

## 2020-09-30 NOTE — ED Triage Notes (Signed)
Pt to ED [facility] from home Pt found on floor by mother, could not get up Pt covered in dried blood and feces, per EMS had blood coming from mouth and rectum Pt wears 3L oxygen at home, was not wearing oxygen and was 82% on RA, EMS -placed on 15L NRB and came up to 100% Pt is alert and oriented Per EMS pt also has lung mass

## 2020-09-30 NOTE — ED Notes (Signed)
EDP aware of elevated lactic. Second fluid bolus ordered.

## 2020-09-30 NOTE — ED Notes (Signed)
Margo at [facility] bed placement called, they are still trying to figure out where pt being admitted to. Will call back with update. [phone] ext [phone].

## 2020-09-30 NOTE — Consult Note (Addendum)
NAME:  Marcus [name], MRN:  [id number], DOB:  [birth year], LOS: 0 ADMISSION DATE:  09/30/2020, CONSULTATION DATE: 6/26 REFERRING MD: Dr. 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  He was recently admitted to [facility] for hemoptysis leading to shortness of breath.  He underwent CT angiogram of the chest which demonstrated right upper lobe cavitary lesion increasing in size and findings consistent of a cavitary/necrotizing pneumonia.  During that admission he was initially treated with IV antibiotics and infectious disease specialist were consulted.  Respiratory culture grew MRSA and Candida dublisiensis, Fungitell was negative.  He was treated with Diflucan as well which was discontinued by ID as it was felt to be a colonizer.  He was ultimately discharged 6/24 on a 3-week course of linezolid and Flagyl.  He presented again to [facility] emergency department on 6/26 with complaints of massive hemoptysis after being found down at home with blood on the floor around him.  Upon arrival to the emergency department he had finding concerning for hemorrhagic shock with systolic blood pressure 64 and tachycardia with rates in the 120s.  He was provided with 2 units of emergency release blood.  Hemoglobin resulted at 8.0, which is down from 10.6 just 2 days prior.  Overall, he did improve after blood transfusion and crystalloid infusions.  There was some concern that the bleeding may be coming from a GI source, the patient was seen by GI without any plans for intervention.  Hemoptysis slowed down considerably in the emergency department and the patient's clinical status improved.  He was transferred to progressive care [facility] for pulmonary, CVTS, and IR  evaluation.   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   Significant Hospital Events: Including procedures, antibiotic start and stop dates in addition to other pertinent events   6/26 [facility] > [facility]  Interim History / Subjective:    Objective   Blood pressure (!) 92/58, pulse 76, temperature 97.8 F (36.6 C), temperature source Oral, resp. rate 20, height 5\' 9"  (1.753 m), weight 61.1 kg, SpO2 100 %.       No intake or output data in the 24 hours ending 09/30/20 2330 Filed Weights   09/30/20 2145  Weight: 61.1 kg    Examination: General: Adult male appears older than stated age. No distress.  HENT: [place]/ AT PERRL, no JVD Lungs: Rhonchi bilateral bases Cardiovascular: RRR, no MRG Abdomen: Soft, non-tender, non-distended. Melena in bed.  Extremities: No acute deformity or ROM limitation.  Neuro: Alert, oriented, non-focal.  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 Emphysema.  CT abdomen/pelvis contrasted > Contrast extravasation in the anus. Aneurysm of the infrarenal abdominal aorta.     6/15 aspergillus antigen  negative, fungitel negative 6/16 MRSA sputum 6/16 sputum Candida dubliniensis  6/16 AFB negative  6/21 aspergillus negative  Resolved Hospital Problem list     Assessment & Plan:   Hemoptysis secondary to [name] consistent with mycetoma.  Cavitary pneumonia Acute on chronic hypoxemic respiratory failure.  - seems to have  calmed down quite a bit - Supplemental O2.  - Will watch closely overnight and defer planning to a multidisciplinary team tomorrow morning. - Will likely need IR embolization or resection for definitive therapy. - In the mean time, continue antibiotics as directed by ID from previous admission linezolid and metronidazole.  - Consider adding anti-fungal. Resp cx with candida from prior admission. Diflucan DC by ID.  - TXA nebs PRN - If he would again develop massive hemoptysis we will likely need to intubate the L mainstem.  Hemorrhagic shock: improved Acute blood loss anemia: Hemoptysis is certainly present, but CT demonstrated contrast extravasation into the anus and there is a moderate to large amount of melanotic stool in the patients bed on exam. GI hemorrhage could very well be the larger contributor.  - Stable for PCU admission at this time.  - repeat CBC, INR now - Transfuse to keep INR greater than 8 in active bleed - Transfuse platelets if drop below 50k. Currently 77.  - Will need GI to re-evaluate at some point.   COPD without exacerbation - Brovana, hold home dulera - Duoneb PRN - Continue chronic prednisone  Elevated anion gap acidosis: lactic improved, BUN 88.  - follow BMP  Hyperkalemia Hypocalcemia - per primary  Best Practice (right click and "Reselect all SmartList Selections" daily)   Diet/type: NPO Pain/Anxiety/Delirium protocol Not indicated VAP protocol (if indicated): Not indicated DVT prophylaxis: not indicated GI prophylaxis: PPI Glucose control:  not indicated Central venous access:  N/A Arterial line:  N/A Foley:  N/A Mobility:  bed rest  PT consulted: N/A Studies pending: echo Culture data pending:sputum and blood Last reviewed culture data:today Antibiotics:zyvox and flagyl  Antibiotic de-escalation: no,  continue current rx Stop date: N/A Code Status:  full code Last date of multidisciplinary goals of care discussion [ ]  ccm prognosis:  stable  Disposition: admit to progressive care    Labs   CBC: Recent Labs  Lab 09/26/20 0425 09/27/20 0403 09/28/20 0518 09/30/20 0939 09/30/20 1225 09/30/20 1759  WBC 10.6* 6.4 3.5* 7.3 8.2  --   NEUTROABS 5.7 2.8 1.0* 4.6 5.3  --   HGB 10.5* 10.4* 10.6* 8.0* 8.9* 8.0*  HCT 33.2* 32.9* 33.8* 25.0* 25.8* 23.1*  MCV 87.4 88.0 87.3 88.7 84.9  --   PLT 199 182 185 104* 77*  --     Basic Metabolic Panel: Recent Labs  Lab 09/24/20 0702 09/25/20 0416 09/27/20 0403 09/30/20 1053  NA 135  --  134* 136  K 5.0  --  4.9 5.4*  CL 94*  --  92* 101  CO2 35*  --  34* 18*  GLUCOSE 123*  --  106* 94  BUN 23*  --  28* 88*  CREATININE 0.56*  --  0.39* 1.55*  CALCIUM 8.3*  --  8.4* 7.1*  MG  --  2.2  --  2.0   GFR: Estimated Creatinine Clearance: 44.3 mL/min (A) (by C-G formula based on SCr of 1.55 mg/dL (H)). Recent Labs  Lab 09/27/20 0403 09/28/20 0518 09/30/20 0939 09/30/20 0940 09/30/20 1053 09/30/20 1225 09/30/20 1758  WBC 6.4 3.5* 7.3  --   --  8.2  --   LATICACIDVEN  --   --   --  9.1* 8.2*  --  2.2*    Liver Function Tests: Recent Labs  Lab 09/30/20 1053  AST 296*  ALT 179*  ALKPHOS 155*  BILITOT 1.0  PROT 5.0*  ALBUMIN 2.0*   No results for input(s): LIPASE, AMYLASE in the last 168 hours. No results for input(s): AMMONIA in the last 168 hours.  ABG    Component Value Date/Time   PHART 7.312 (L) 01/18/2020 0029   PCO2ART 57.6 (H) 01/18/2020 0029   PO2ART 104 01/18/2020 0029   HCO3 29.3 (H) 09/17/2020 1349   TCO2 31 01/18/2020 0029   ACIDBASEDEF 1.2 08/03/2020 2205   O2SAT 85.9 09/17/2020 1349     Coagulation Profile: Recent Labs  Lab 09/30/20 0939  INR 1.6*    Cardiac Enzymes: No results for input(s): CKTOTAL, CKMB, CKMBINDEX, TROPONINI in the last 168 hours.  HbA1C: Hgb A1c MFr Bld  Date/Time Value Ref Range Status  07/24/2015 01:28 PM  4.0 - 6.0 % Final   UNABLE TO REPORT A1C DUE TO UNKNOWN INTERFERING FACTOR CAUSING THE ANALYTICAL  RANGE TO BE OUTSIDE OF ANALYZER RANGE SAMPLE SENT TO [facility] FOR AN ALTERNATIVE METHOD    CBG: No results for input(s): GLUCAP in the last 168 hours.  Review of Systems:  limited by poor historian  Bolds are positive  Constitutional: weight loss, gain, night sweats, Fevers, chills, fatigue .  HEENT: headaches, Sore throat, sneezing, nasal congestion, post nasal drip, Difficulty swallowing, Tooth/dental problems, visual complaints visual changes, ear ache CV:  chest pain, radiates:,Orthopnea, PND, swelling in lower extremities, dizziness, palpitations, syncope.  GI  heartburn, indigestion, abdominal pain, nausea, vomiting, diarrhea, change in bowel habits, loss of appetite, bloody stools.  Resp: cough, productive: , hemoptysis, dyspnea, chest pain, pleuritic.  Skin: rash or itching or icterus GU: dysuria, change in color of urine, urgency or frequency. flank pain, hematuria  MS: joint pain or swelling. decreased range of motion  Psych: change in mood or affect. depression or anxiety.  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   Surgical History:   Past Surgical History:  Procedure Laterality Date   INCISIONAL HERNIA REPAIR  01/17/2020   Procedure: HERNIA REPAIR INCISIONAL AND Silvestre Gunner;  Surgeon: Kinsinger, Arta Bruce, MD;  Location: [facility];  Service: General;;   LAPAROTOMY N/A 01/17/2020   Procedure: EXPLORATORY LAPAROTOMY;  Surgeon: Mickeal Skinner, MD;  Location: [facility];  Service: General;  Laterality: N/A;     Social History:   reports that he has been smoking cigarettes. He has been smoking an average of 1.00 packs per day. He has never used smokeless tobacco. He reports current drug use. Drug: Marijuana. He reports  that he does not drink alcohol.   Family History:  His family history includes CAD in his brother; COPD in his brother.   Allergies Allergies  Allergen Reactions   Levaquin [Levofloxacin In D5w] Other (See Comments)    Chest pain   Methotrexate Other (See Comments)    Chest pain     Home Medications  Prior to Admission medications   Medication Sig Start Date End Date Taking? Authorizing Provider  albuterol (PROVENTIL) (2.5 MG/3ML) 0.083% nebulizer solution Take 3 mLs (2.5 mg total) by nebulization every 6 (six) hours as needed for wheezing or shortness of breath. 08/30/20   Kathie Dike, MD  albuterol (  VENTOLIN HFA) 108 (90 Base) MCG/ACT inhaler Inhale 2 puffs into the lungs every 6 (six) hours as needed for wheezing or shortness of breath. 08/30/20   Kathie Dike, MD  benzonatate (TESSALON) 200 MG capsule Take 1 capsule (200 mg total) by mouth 2 (two) times daily as needed for up to 14 days for cough. 09/28/20 10/12/20  Nolberto Hanlon, MD  feeding supplement (ENSURE ENLIVE / ENSURE PLUS) LIQD Take 237 mLs by mouth 2 (two) times daily between meals. 08/04/20   Manuella Ghazi, Pratik D, DO  guaiFENesin (MUCINEX) 600 MG 12 hr tablet Take 1 tablet (600 mg total) by mouth 2 (two) times daily for 14 days. 09/28/20 10/12/20  Nolberto Hanlon, MD  linezolid (ZYVOX) 600 MG tablet Take 1 tablet (600 mg total) by mouth 2 (two) times daily for 21 days. 09/28/20 10/19/20  Nolberto Hanlon, MD  metroNIDAZOLE (FLAGYL) 500 MG tablet Take 1 tablet (500 mg total) by mouth 2 (two) times daily for 21 days. 09/28/20 10/19/20  Nolberto Hanlon, MD  mometasone-formoterol (DULERA) 100-5 MCG/ACT AERO Inhale 2 puffs into the lungs 2 (two) times daily. 08/30/20   Kathie Dike, MD  Multiple Vitamin (MULTIVITAMIN WITH MINERALS) TABS tablet Take 1 tablet by mouth daily. 09/29/20 10/29/20  Nolberto Hanlon, MD  mupirocin ointment (BACTROBAN) 2 % Place into the nose 2 (two) times daily. 09/28/20   Nolberto Hanlon, MD  nicotine (NICODERM CQ - DOSED IN MG/24  HOURS) 21 mg/24hr patch Place 1 patch (21 mg total) onto the skin daily for 28 days. 09/29/20 10/27/20  Nolberto Hanlon, MD  pantoprazole (PROTONIX) 40 MG tablet Take 1 tablet (40 mg total) by mouth daily for 10 days. Patient not taking: No sig reported 07/02/20 10/04/20  Heath Lark D, DO  predniSONE (DELTASONE) 10 MG tablet Take 1 tablet (10 mg total) by mouth 2 (two) times daily with a meal. Patient not taking: No sig reported 08/30/20   Kathie Dike, MD     Critical care time:      Georgann Housekeeper, AGACNP-BC [place] for personal pager PCCM on call pager [phone] until 7pm. Please call Elink 7p-7a. [phone]  10/01/2020 12:34 AM

## 2020-09-30 NOTE — ED Notes (Signed)
Sent msg to pharmacy for missing meds: ensure and linezolid PO.

## 2020-09-30 NOTE — ED Notes (Signed)
EDP AT BEDSIDE

## 2020-09-30 NOTE — Consult Note (Signed)
Consultation  Referring Provider:     ED Admit date: 6/26 Consult date: 6/26         Reason for Consultation:   Coffee ground emesis vs hemoptysis           HPI:   Marcus [name] is a 60 y.o. gentleman with history of Felty syndrome and smoking who we are consulted for concern of coffee ground emesis. Patient was recently discharged after being evaluated for large cavitary lesion in his right upper lung consistent with MRSA infection. Per ED staff patient was found with a lot of coffee grounds on his clothes. History is somewhat difficult from patient as when asked to clarify he says he is spitting up blood and "shitting blood" states theses symptoms started last Friday and he was in the hospital being evaluated for hemoptysis and was discharged two days ago. Patients brother presented to the ED as well and confirmed he has been having hemoptysis, similar to what he had been having in the hospital this past week.  Patient is on 2-3 L of O2 at home for COPD. He has a history of splenectomy and has never had EGD before that I can see in the chart. On deep review of the chart it seems in October of 2021 he had a perforated pyloric channel ulcer that was surgically repaired. Denies alcohol use. Also denies NSAID use. No blood thinners. CTA resulted and no bleeding in chest but possible hemorrhoidal/anal outlet bleeding in rectum. 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 He was supposed to get a EGD/Colonoscopy but this never happened. 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  Past Medical History:  Diagnosis Date   COPD (chronic obstructive pulmonary disease) (HCC)    Felty syndrome (HCC)    Hernia, epigastric    Pancytopenia  (HCC)    Seropositive rheumatoid arthritis ([place])    Tobacco use disorder     Past Surgical History:  Procedure Laterality Date   INCISIONAL HERNIA REPAIR  01/17/2020   Procedure: HERNIA REPAIR INCISIONAL AND Silvestre Gunner;  Surgeon: Kinsinger, Arta Bruce, MD;  Location: [facility];  Service: General;;   LAPAROTOMY N/A 01/17/2020   Procedure: EXPLORATORY LAPAROTOMY;  Surgeon: Mickeal Skinner, MD;  Location: [facility];  Service: General;  Laterality: N/A;    Family History  Problem Relation Age of Onset   CAD Brother    COPD Brother    Social History   Tobacco Use   Smoking status: Every Day    Packs/day: 1.00    Pack years: 0.00    Types: Cigarettes   Smokeless tobacco: Never  Substance Use Topics   Alcohol use: No    Alcohol/week: 0.0 standard drinks   Drug use: Yes    Types: Marijuana    Prior to Admission medications   Medication Sig Start Date End Date Taking? Authorizing Provider  albuterol (PROVENTIL) (2.5 MG/3ML) 0.083% nebulizer solution Take 3 mLs (2.5 mg total) by nebulization every 6 (six) hours as needed for wheezing or shortness of breath. 08/30/20   Kathie Dike, MD  albuterol (VENTOLIN HFA) 108 (90 Base) MCG/ACT inhaler Inhale 2 puffs into the lungs every 6 (six) hours as needed  for wheezing or shortness of breath. 08/30/20   Kathie Dike, MD  benzonatate (TESSALON) 200 MG capsule Take 1 capsule (200 mg total) by mouth 2 (two) times daily as needed for up to 14 days for cough. 09/28/20 10/12/20  Nolberto Hanlon, MD  feeding supplement (ENSURE ENLIVE / ENSURE PLUS) LIQD Take 237 mLs by mouth 2 (two) times daily between meals. 08/04/20   Manuella Ghazi, Pratik D, DO  guaiFENesin (MUCINEX) 600 MG 12 hr tablet Take 1 tablet (600 mg total) by mouth 2 (two) times daily for 14 days. 09/28/20 10/12/20  Nolberto Hanlon, MD  linezolid (ZYVOX) 600 MG tablet Take 1 tablet (600 mg total) by mouth 2 (two) times daily for 21 days. 09/28/20 10/19/20  Nolberto Hanlon, MD  metroNIDAZOLE (FLAGYL) 500 MG  tablet Take 1 tablet (500 mg total) by mouth 2 (two) times daily for 21 days. 09/28/20 10/19/20  Nolberto Hanlon, MD  mometasone-formoterol (DULERA) 100-5 MCG/ACT AERO Inhale 2 puffs into the lungs 2 (two) times daily. 08/30/20   Kathie Dike, MD  Multiple Vitamin (MULTIVITAMIN WITH MINERALS) TABS tablet Take 1 tablet by mouth daily. 09/29/20 10/29/20  Nolberto Hanlon, MD  mupirocin ointment (BACTROBAN) 2 % Place into the nose 2 (two) times daily. 09/28/20   Nolberto Hanlon, MD  nicotine (NICODERM CQ - DOSED IN MG/24 HOURS) 21 mg/24hr patch Place 1 patch (21 mg total) onto the skin daily for 28 days. 09/29/20 10/27/20  Nolberto Hanlon, MD  pantoprazole (PROTONIX) 40 MG tablet Take 1 tablet (40 mg total) by mouth daily for 10 days. 07/02/20 10/04/20  Manuella Ghazi, Pratik D, DO  predniSONE (DELTASONE) 10 MG tablet Take 1 tablet (10 mg total) by mouth 2 (two) times daily with a meal. 08/30/20   Kathie Dike, MD    Current Facility-Administered Medications  Medication Dose Route Frequency Provider Last Rate Last Admin   hydrocortisone sodium succinate (SOLU-CORTEF) 100 MG injection 100 mg  100 mg Intravenous Once Vladimir Crofts, MD       iohexol (OMNIPAQUE) 350 MG/ML injection 100 mL  100 mL Intravenous Once PRN Vladimir Crofts, MD       Derrill Memo ON 10/03/2020] pantoprazole (PROTONIX) injection 40 mg  40 mg Intravenous Q12H Vladimir Crofts, MD       pantoprozole (PROTONIX) 80 mg /NS 100 mL infusion  8 mg/hr Intravenous Continuous Vladimir Crofts, MD       Current Outpatient Medications  Medication Sig Dispense Refill   albuterol (PROVENTIL) (2.5 MG/3ML) 0.083% nebulizer solution Take 3 mLs (2.5 mg total) by nebulization every 6 (six) hours as needed for wheezing or shortness of breath. 75 mL 1   albuterol (VENTOLIN HFA) 108 (90 Base) MCG/ACT inhaler Inhale 2 puffs into the lungs every 6 (six) hours as needed for wheezing or shortness of breath. 8 g 2   benzonatate (TESSALON) 200 MG capsule Take 1 capsule (200 mg total) by mouth 2 (two)  times daily as needed for up to 14 days for cough. 20 capsule 0   feeding supplement (ENSURE ENLIVE / ENSURE PLUS) LIQD Take 237 mLs by mouth 2 (two) times daily between meals. 237 mL 12   guaiFENesin (MUCINEX) 600 MG 12 hr tablet Take 1 tablet (600 mg total) by mouth 2 (two) times daily for 14 days. 28 tablet 0   linezolid (ZYVOX) 600 MG tablet Take 1 tablet (600 mg total) by mouth 2 (two) times daily for 21 days. 42 tablet 0   metroNIDAZOLE (FLAGYL) 500 MG tablet Take 1 tablet (500 mg total)  by mouth 2 (two) times daily for 21 days. 42 tablet 0   mometasone-formoterol (DULERA) 100-5 MCG/ACT AERO Inhale 2 puffs into the lungs 2 (two) times daily. 1 each 0   Multiple Vitamin (MULTIVITAMIN WITH MINERALS) TABS tablet Take 1 tablet by mouth daily. 30 tablet 0   mupirocin ointment (BACTROBAN) 2 % Place into the nose 2 (two) times daily. 22 g 0   nicotine (NICODERM CQ - DOSED IN MG/24 HOURS) 21 mg/24hr patch Place 1 patch (21 mg total) onto the skin daily for 28 days. 28 patch 0   pantoprazole (PROTONIX) 40 MG tablet Take 1 tablet (40 mg total) by mouth daily for 10 days. 10 tablet 0   predniSONE (DELTASONE) 10 MG tablet Take 1 tablet (10 mg total) by mouth 2 (two) times daily with a meal. 60 tablet 1    Allergies as of 09/30/2020 - Review Complete 09/30/2020  Allergen Reaction Noted   Levaquin [levofloxacin in d5w] Other (See Comments) 07/28/2015   Methotrexate Other (See Comments) 08/06/2015     Review of Systems:    All systems reviewed and negative except where noted in HPI.     Physical Exam:  Vital signs in last 24 hours: Temp:  [97.7 F (36.5 C)-97.8 F (36.6 C)] 97.7 F (36.5 C) (06/26 1007) Pulse Rate:  [67-123] 118 (06/26 1015) Resp:  [30-41] 32 (06/26 1015) BP: (64-110)/(41-63) 77/47 (06/26 1015) SpO2:  [97 %-100 %] 100 % (06/26 1015) Weight:  [63.5 kg] 63.5 kg (06/26 0910)   General:   Chronically ill appearing gentleman Head:  Normocephalic and atraumatic. Eyes:   No  icterus.   Conjunctiva pink. Mouth: Dry mucosa Neck:  Supple; no masses felt Lungs:  Coarse breath sounds Heart:  tachycardic Abdomen:   Non-tender Rectal:  Not performed as patient requested not to perform Msk:  No clubbing or cyanosis Neurologic:  Alert and  oriented x4;  No focal deficits Psych:  Alert and cooperative. Normal affect.  LAB RESULTS: Recent Labs    09/28/20 0518 09/30/20 0939  WBC 3.5* 7.3  HGB 10.6* 8.0*  HCT 33.8* 25.0*  PLT 185 104*   BMET No results for input(s): NA, K, CL, CO2, GLUCOSE, BUN, CREATININE, CALCIUM in the last 72 hours. LFT No results for input(s): PROT, ALBUMIN, AST, ALT, ALKPHOS, BILITOT, BILIDIR, IBILI in the last 72 hours. PT/INR Recent Labs    09/30/20 0939  LABPROT 19.1*  INR 1.6*    STUDIES: DG Chest Portable 1 View  Result Date: 09/30/2020 CLINICAL DATA:  Hemoptysis, GI bleed. EXAM: PORTABLE CHEST 1 VIEW COMPARISON:  September 25, 2020. FINDINGS: EKG leads project over the chest. Persistent opacity in the RIGHT mid chest in the area of the cavitary focus that was seen on prior CT imaging. Unchanged compared to September 25, 2020. No new areas of lobar consolidative changes. No pneumothorax.  No pleural effusion on frontal radiograph. No acute bone finding on limited assessment. IMPRESSION: 1. Persistent opacity in the RIGHT mid chest in the area of the cavitary focus that was seen on prior CT imaging. 2. Essentially no change from recent comparison. Electronically Signed   By: Zetta Bills M.D.   On: 09/30/2020 10:57       Impression / Plan:   60 y/o gentleman with complicated past medical history including felty syndrome s/p splenectomy in 2018, history of perforated PUD in October requiring surgical repair, MRSA pneumonia with large cavitary lesion, COPD on chronic O2 (2-3L), who presents with recurrent hemoptysis. CT  imaging with only possible hemorrhoidal bleeding. There is some association with felty disease and non-cirrhotic portal  hypertension but the portal hypertension resolves after splenectomy which the patient has received  - he needs to be resuscitated with IVF - he likely has some component of adrenal insufficiency as blood pressure responded well to stress dose steroids - trend lactates - Pulm and ID consults since they were following him closely - continue protonix for now - at some point he would benefit from EGD given history of perforated duodenal ulcer in October (H pylori antibody was negative) Also could consider flex sig given positive CTA findings but would first recommend continue resuscitation  Will continue to follow closely, please call if any concerns or questions.  Raylene Miyamoto MD, MPH [place]

## 2020-09-30 NOTE — ED Notes (Signed)
Second nurse at bedside inserting IV and drawign labs.

## 2020-09-30 NOTE — H&P (Signed)
History and Physical    Marcus [name] EQA:[id number] DOB: 02-18-1961 DOA: 09/30/2020  PCP: [name], FNP Patient coming from: [facility]  Chief Complaint: Hemoptysis  HPI: Marcus [name] is a 60 y.o. male with medical history significant of COPD, Felty syndrome, rheumatoid arthritis, history of cavitary pneumonia due to MRSA and Candida since May 2022 with several hospitalizations.  Last admission 6/13-6/24/2022 with persistent hemoptysis and cavitary pneumonia with enlarging cavitation, neutropenia requiring Granix x2.  He is followed by pulmonology Dr. Raul Del and infectious disease Dr. Ramon Dredge.  At the time of discharge the patient was to continue linezolid and Flagyl and was to have close follow-up with pulmonology and infectious disease.  Patient also has a remote history of ulcer several years ago.  This morning patient was found down on the floor in a pool of coffee-ground emesis and frank blood.  Family called EMS and he was transported to [facility] ED for evaluation.    In the ED, he was hypotensive with blood pressure 64/41, tachycardic with heart rate in the 120s, and tachypneic with respiratory rate in the 40s.  He appeared acutely ill.  Labs showing WBC 7.3, hemoglobin 8.0 (was 10.6 on 09/28/2020 and previously as high as 13.8 on 09/06/2020), platelet count 104K.  Sodium 136, potassium 5.4, chloride 101, bicarb 18, anion gap 17, BUN 88, creatinine 1.5 (was 0.3 on 09/27/2020), glucose 94.  Calcium 7.1, albumin 2.0.  AST 296, ALT 179, alk phos 155, T bili 1.0.  Alk phos was previously mildly elevated but remainder of LFTs were normal on 09/18/2020.  INR 1.6.  Lactic acid 9.1 >8.2 >2.2 with IV fluids.  Blood culture x2 pending.  COVID and influenza PCR negative.  UA without signs of infection.  Magnesium 2.0.  EKG with sinus tachycardia, RBBB, LAFB,, and machine reading it as acute MI.  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  High-sensitivity troponin mildly elevated but stable 78 >64.   CT angiogram  chest/abdomen/pelvis showing persistent cavitary lesion in the anterior right upper lobe complicated by mycetoma.  Showing contrast extravasation at the anus consistent with hemorrhoidal bleeding or other very distal etiology of GI bleed.  Redemonstrated aneurysm of the infrarenal abdominal aorta measuring up to 3.2 x 3.1 cm with no evidence of rupture, dissection, or other acute aortic pathology.  Radiologist recommending follow-up ultrasound every 3 years.  Patient was given 2 units PRBCs at [facility]. Hemoglobin improved to 8.9 on repeat CBC but platelet count dropped to 77K.  Subsequent H&H showing hemoglobin 8.0. Patient was seen by Dr. Haig Prophet from GI at [facility] who did not see indication for acute intervention.  He was given IV Protonix 80 mg and infusion was started.  Patient was also given 3 L fluid boluses.  His vital signs improved with blood pressure 140/96, heart rate 104, respiratory rate 23.  Hospitalist at [facility] saw the patient.  Pulmonology at [facility] recommended IR ablation, not sure if he is a resection candidate.  He was sent to [facility] progressive care unit for pulm consult.  On the way to the hospital, EMS reported hypotension with systolic in the 97L and patient was given additional 500 cc bolus.    Patient is a poor historian, history limited.  Reports coughing up blood since October last year.  States this morning his mother found him covered in blood.  Denies chest pain, shortness of breath, or fevers.  He uses 3 L home oxygen which he thinks he has been  doing so since October last year.  Reports history of chronic generalized abdominal pain for the past 5 years.  He has not vomited.  He has not noticed any blood in his stool or dark stools.  Review of Systems:  All systems reviewed and apart from history of presenting illness, are negative.  Past Medical History:  Diagnosis Date   COPD (chronic obstructive pulmonary disease) (HCC)    Felty syndrome (HCC)    Hernia, epigastric     Pancytopenia (HCC)    Seropositive rheumatoid arthritis ([place])    Tobacco use disorder     Past Surgical History:  Procedure Laterality Date   INCISIONAL HERNIA REPAIR  01/17/2020   Procedure: HERNIA REPAIR INCISIONAL AND Silvestre Gunner;  Surgeon: Kinsinger, Arta Bruce, MD;  Location: [facility] ORS;  Service: General;;   LAPAROTOMY N/A 01/17/2020   Procedure: EXPLORATORY LAPAROTOMY;  Surgeon: Mickeal Skinner, MD;  Location: [facility] ORS;  Service: General;  Laterality: N/A;     reports that he has been smoking cigarettes. He has been smoking an average of 1.00 packs per day. He has never used smokeless tobacco. He reports current drug use. Drug: Marijuana. He reports that he does not drink alcohol.  Allergies  Allergen Reactions   Levaquin [Levofloxacin In D5w] Other (See Comments)    Chest pain   Methotrexate Other (See Comments)    Chest pain    Family History  Problem Relation Age of Onset   CAD Brother    COPD Brother     Prior to Admission medications   Medication Sig Start Date End Date Taking? Authorizing Provider  albuterol (PROVENTIL) (2.5 MG/3ML) 0.083% nebulizer solution Take 3 mLs (2.5 mg total) by nebulization every 6 (six) hours as needed for wheezing or shortness of breath. 08/30/20   Kathie Dike, MD  albuterol (VENTOLIN HFA) 108 (90 Base) MCG/ACT inhaler Inhale 2 puffs into the lungs every 6 (six) hours as needed for wheezing or shortness of breath. 08/30/20   Kathie Dike, MD  benzonatate (TESSALON) 200 MG capsule Take 1 capsule (200 mg total) by mouth 2 (two) times daily as needed for up to 14 days for cough. 09/28/20 10/12/20  Nolberto Hanlon, MD  feeding supplement (ENSURE ENLIVE / ENSURE PLUS) LIQD Take 237 mLs by mouth 2 (two) times daily between meals. 08/04/20   Manuella Ghazi, Pratik D, DO  guaiFENesin (MUCINEX) 600 MG 12 hr tablet Take 1 tablet (600 mg total) by mouth 2 (two) times daily for 14 days. 09/28/20 10/12/20  Nolberto Hanlon, MD  linezolid (ZYVOX) 600 MG tablet Take 1  tablet (600 mg total) by mouth 2 (two) times daily for 21 days. 09/28/20 10/19/20  Nolberto Hanlon, MD  metroNIDAZOLE (FLAGYL) 500 MG tablet Take 1 tablet (500 mg total) by mouth 2 (two) times daily for 21 days. 09/28/20 10/19/20  Nolberto Hanlon, MD  mometasone-formoterol (DULERA) 100-5 MCG/ACT AERO Inhale 2 puffs into the lungs 2 (two) times daily. 08/30/20   Kathie Dike, MD  Multiple Vitamin (MULTIVITAMIN WITH MINERALS) TABS tablet Take 1 tablet by mouth daily. 09/29/20 10/29/20  Nolberto Hanlon, MD  mupirocin ointment (BACTROBAN) 2 % Place into the nose 2 (two) times daily. 09/28/20   Nolberto Hanlon, MD  nicotine (NICODERM CQ - DOSED IN MG/24 HOURS) 21 mg/24hr patch Place 1 patch (21 mg total) onto the skin daily for 28 days. 09/29/20 10/27/20  Nolberto Hanlon, MD  pantoprazole (PROTONIX) 40 MG tablet Take 1 tablet (40 mg total) by mouth daily for 10 days. Patient  not taking: No sig reported 07/02/20 10/04/20  Manuella Ghazi, Pratik D, DO  predniSONE (DELTASONE) 10 MG tablet Take 1 tablet (10 mg total) by mouth 2 (two) times daily with a meal. Patient not taking: No sig reported 08/30/20   Kathie Dike, MD    Physical Exam: Vitals:   09/30/20 2257 09/30/20 2319 09/30/20 2320 10/01/20 0000  BP: (!) 92/58   103/80  Pulse:    79  Resp: _0 Temp: 97.9 F (36.6 C)   97.8 F (36.6 C)  TempSrc: Oral   Oral  SpO2:    96%  Weight:      Height:        Physical Exam Constitutional:      General: He is not in acute distress. HENT:     Head: Normocephalic and atraumatic.  Eyes:     Extraocular Movements: Extraocular movements intact.     Conjunctiva/sclera: Conjunctivae normal.  Cardiovascular:     Rate and Rhythm: Normal rate and regular rhythm.     Pulses: Normal pulses.  Pulmonary:     Effort: Pulmonary effort is normal.     Breath sounds: Rales present. No wheezing.  Abdominal:     General: Bowel sounds are normal. There is no distension.     Palpations: Abdomen is soft.     Tenderness: There is no  abdominal tenderness. There is no guarding or rebound.  Musculoskeletal:        General: No swelling or tenderness.     Cervical back: Normal range of motion and neck supple.  Skin:    General: Skin is warm and dry.     Comments: Areas of ecchymosis noted on arms  Neurological:     General: No focal deficit present.     Mental Status: He is alert and oriented to person, place, and time.     Labs on Admission: I have personally reviewed following labs and imaging studies  CBC: Recent Labs  Lab 09/26/20 0425 09/27/20 0403 09/28/20 0518 09/30/20 0939 09/30/20 1225 09/30/20 1759  WBC 10.6* 6.4 3.5* 7.3 8.2  --   NEUTROABS 5.7 2.8 1.0* 4.6 5.3  --   HGB 10.5* 10.4* 10.6* 8.0* 8.9* 8.0*  HCT 33.2* 32.9* 33.8* 25.0* 25.8* 23.1*  MCV 87.4 88.0 87.3 88.7 84.9  --   PLT 199 182 185 104* 77*  --    Basic Metabolic Panel: Recent Labs  Lab 09/24/20 0702 09/25/20 0416 09/27/20 0403 09/30/20 1053  NA 135  --  134* 136  K 5.0  --  4.9 5.4*  CL 94*  --  92* 101  CO2 35*  --  34* 18*  GLUCOSE 123*  --  106* 94  BUN 23*  --  28* 88*  CREATININE 0.56*  --  0.39* 1.55*  CALCIUM 8.3*  --  8.4* 7.1*  MG  --  2.2  --  2.0   GFR: Estimated Creatinine Clearance: 44.3 mL/min (A) (by C-G formula based on SCr of 1.55 mg/dL (H)). Liver Function Tests: Recent Labs  Lab 09/30/20 1053  AST 296*  ALT 179*  ALKPHOS 155*  BILITOT 1.0  PROT 5.0*  ALBUMIN 2.0*   No results for input(s): LIPASE, AMYLASE in the last 168 hours. No results for input(s): AMMONIA in the last 168 hours. Coagulation Profile: Recent Labs  Lab 09/30/20 0939  INR 1.6*   Cardiac Enzymes: No results for input(s): CKTOTAL, CKMB, CKMBINDEX, TROPONINI in the last 168 hours. BNP (last 3  results) No results for input(s): PROBNP in the last 8760 hours. HbA1C: No results for input(s): HGBA1C in the last 72 hours. CBG: No results for input(s): GLUCAP in the last 168 hours. Lipid Profile: No results for input(s):  CHOL, HDL, LDLCALC, TRIG, CHOLHDL, LDLDIRECT in the last 72 hours. Thyroid Function Tests: No results for input(s): TSH, T4TOTAL, FREET4, T3FREE, THYROIDAB in the last 72 hours. Anemia Panel: No results for input(s): VITAMINB12, FOLATE, FERRITIN, TIBC, IRON, RETICCTPCT in the last 72 hours. Urine analysis:    Component Value Date/Time   COLORURINE YELLOW (A) 09/30/2020 1215   APPEARANCEUR HAZY (A) 09/30/2020 1215   LABSPEC 1.018 09/30/2020 1215   PHURINE 5.0 09/30/2020 1215   GLUCOSEU NEGATIVE 09/30/2020 1215   HGBUR SMALL (A) 09/30/2020 1215   BILIRUBINUR NEGATIVE 09/30/2020 1215   KETONESUR 5 (A) 09/30/2020 1215   PROTEINUR NEGATIVE 09/30/2020 1215   UROBILINOGEN 0.2 10/21/2012 1737   NITRITE NEGATIVE 09/30/2020 1215   LEUKOCYTESUR NEGATIVE 09/30/2020 1215    Radiological Exams on Admission: CT Angio Chest PE W and/or Wo Contrast  Result Date: 09/30/2020 CLINICAL DATA:  Hemoptysis, hematochezia, evaluate for GI bleed EXAM: CT ANGIOGRAPHY CHEST, ABDOMEN AND PELVIS TECHNIQUE: Non-contrast CT of the abdomen and pelvis initially obtained. Multidetector CT imaging through the chest, abdomen and pelvis was performed using the standard protocol during bolus administration of intravenous contrast. Multiplanar reconstructed images and MIPs were obtained and reviewed to evaluate the vascular anatomy. CONTRAST:  170m OMNIPAQUE IOHEXOL 350 MG/ML SOLN COMPARISON:  09/17/2020 FINDINGS: CTA CHEST FINDINGS Cardiovascular: Preferential opacification of the thoracic aorta. Normal contour and caliber of the thoracic aorta. No evidence of aneurysm, dissection, or other acute aortic pathology. Scattered aortic atherosclerosis. No evidence of pulmonary embolism on this non tailored examination. Normal heart size. Three-vessel coronary artery calcifications. No pericardial effusion. Mediastinum/Nodes: No enlarged mediastinal, hilar, or axillary lymph nodes. Thyroid gland, trachea, and esophagus demonstrate no  significant findings. Lungs/Pleura: Mild centrilobular emphysema. Diffuse bilateral bronchial wall thickening. Redemonstrated relatively thick-walled cavitary lesion of the anterior right upper lobe containing soft tissue attenuation debris and measuring approximately 8.4 x 5.7 cm (series 4, image 48). No pleural effusion or pneumothorax. Musculoskeletal: No chest wall abnormality. No acute or significant osseous findings. Review of the MIP images confirms the above findings. 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 No evidence of rupture, dissection, or other acute aortic pathology. Standard branching pattern of the abdominal aorta, with solitary bilateral renal arteries. Moderate mixed calcific atherosclerosis, with atherosclerosis at the branch vessel origins without high-grade stenosis. Review of the MIP images confirms the above findings. NON-VASCULAR Hepatobiliary: No solid liver abnormality is seen. Small gallstones in the dependent gallbladder. Gallbladder wall thickening, or biliary dilatation. Pancreas: Unremarkable. No pancreatic ductal dilatation or surrounding inflammatory changes. Spleen: Normal in size without significant abnormality. Adrenals/Urinary Tract: Adrenal glands are unremarkable. Kidneys are normal, without renal calculi, solid lesion, or hydronephrosis. Bladder is unremarkable. Stomach/Bowel: Stomach is within normal limits. Appendix appears normal. No evidence of bowel wall thickening, distention, or inflammatory changes. There is contrast extravasation at the anus (series 6, image 85). Lymphatic: No enlarged abdominal or pelvic lymph nodes. Reproductive: No mass or other significant abnormality. Other: No abdominal wall hernia or abnormality. No abdominopelvic ascites. Musculoskeletal: No acute or significant osseous findings. Review of the MIP images confirms the above findings.  IMPRESSION: 1. Redemonstrated, thick-walled cavitary lesion of the anterior right upper lobe containing  soft tissue attenuation history. 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 Cavitary malignancy is a significant differential consideration. 2. There is contrast extravasation at the anus, consistent with hemorrhoidal bleeding or other very distal etiology of GI bleed. 3. 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 No evidence of rupture, dissection, or other acute aortic pathology. Recommend follow-up ultrasound every 3 years. This recommendation follows ACR consensus guidelines: White Paper of the ACR Incidental Findings Committee II on Vascular Findings. J Am Coll Radiol 2013; 10:789-794. 4. Emphysema. 5. Diffuse bilateral bronchial wall thickening, consistent with nonspecific infectious or inflammatory bronchitis. 6. Coronary artery disease. 7. Cholelithiasis. Aortic Atherosclerosis (ICD10-I70.0) and Emphysema (ICD10-J43.9). Electronically Signed   By: Eddie Candle M.D.   On: 09/30/2020 11:51   DG Chest Portable 1 View  Result Date: 09/30/2020 CLINICAL DATA:  Hemoptysis, GI bleed. EXAM: PORTABLE CHEST 1 VIEW COMPARISON:  September 25, 2020. FINDINGS: EKG leads project over the chest. Persistent opacity in the RIGHT mid chest in the area of the cavitary focus that was seen on prior CT imaging. Unchanged compared to September 25, 2020. No new areas of lobar consolidative changes. No pneumothorax.  No pleural effusion on frontal radiograph. No acute bone finding on limited assessment. IMPRESSION: 1. Persistent opacity in the RIGHT mid chest in the area of the cavitary focus that was seen on prior CT imaging. 2. Essentially no change from recent comparison. Electronically Signed   By: Zetta Bills M.D.   On: 09/30/2020 10:57   CT Angio Abd/Pel W and/or Wo Contrast  Result Date: 09/30/2020 CLINICAL DATA:  Hemoptysis,  hematochezia, evaluate for GI bleed EXAM: CT ANGIOGRAPHY CHEST, ABDOMEN AND PELVIS TECHNIQUE: Non-contrast CT of the abdomen and pelvis initially obtained. Multidetector CT imaging through the chest, abdomen and pelvis was performed using the standard protocol during bolus administration of intravenous contrast. Multiplanar reconstructed images and MIPs were obtained and reviewed to evaluate the vascular anatomy. CONTRAST:  156m OMNIPAQUE IOHEXOL 350 MG/ML SOLN COMPARISON:  09/17/2020 FINDINGS: CTA CHEST FINDINGS Cardiovascular: Preferential opacification of the thoracic aorta. Normal contour and caliber of the thoracic aorta. No evidence of aneurysm, dissection, or other acute aortic pathology. Scattered aortic atherosclerosis. No evidence of pulmonary embolism on this non tailored examination. Normal heart size. Three-vessel coronary artery calcifications. No pericardial effusion. Mediastinum/Nodes: No enlarged mediastinal, hilar, or axillary lymph nodes. Thyroid gland, trachea, and esophagus demonstrate no significant findings. Lungs/Pleura: Mild centrilobular emphysema. Diffuse bilateral bronchial wall thickening. Redemonstrated relatively thick-walled cavitary lesion of the anterior right upper lobe containing soft tissue attenuation debris and measuring approximately 8.4 x 5.7 cm (series 4, image 48). No pleural effusion or pneumothorax. Musculoskeletal: No chest wall abnormality. No acute or significant osseous findings. Review of the MIP images confirms the above findings. 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 No evidence of rupture, dissection, or other acute aortic pathology. Standard branching pattern of the abdominal aorta, with solitary bilateral renal arteries. Moderate mixed calcific atherosclerosis, with atherosclerosis at the branch vessel origins without high-grade stenosis. Review of the MIP  images confirms the above findings. NON-VASCULAR Hepatobiliary: No solid liver abnormality is seen. Small gallstones in the dependent gallbladder. Gallbladder wall thickening, or biliary dilatation. Pancreas: Unremarkable. No pancreatic ductal dilatation or surrounding inflammatory changes. Spleen: Normal in size without significant abnormality. Adrenals/Urinary Tract: Adrenal glands are unremarkable. Kidneys are normal, without  renal calculi, solid lesion, or hydronephrosis. Bladder is unremarkable. Stomach/Bowel: Stomach is within normal limits. Appendix appears normal. No evidence of bowel wall thickening, distention, or inflammatory changes. There is contrast extravasation at the anus (series 6, image 85). Lymphatic: No enlarged abdominal or pelvic lymph nodes. Reproductive: No mass or other significant abnormality. Other: No abdominal wall hernia or abnormality. No abdominopelvic ascites. Musculoskeletal: No acute or significant osseous findings. Review of the MIP images confirms the above findings. IMPRESSION: 1. Redemonstrated, thick-walled cavitary lesion of the anterior right upper lobe containing soft tissue attenuation history. 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 Cavitary malignancy is a significant differential consideration. 2. There is contrast extravasation at the anus, consistent with hemorrhoidal bleeding or other very distal etiology of GI bleed. 3. 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 No evidence of rupture, dissection, or other acute aortic pathology. Recommend follow-up ultrasound every 3 years. This recommendation follows ACR consensus guidelines: White Paper of the ACR Incidental Findings Committee II on Vascular Findings. J Am Coll Radiol 2013; 10:789-794. 4. Emphysema. 5. Diffuse bilateral bronchial wall thickening, consistent with nonspecific infectious or inflammatory  bronchitis. 6. Coronary artery disease. 7. Cholelithiasis. Aortic Atherosclerosis (ICD10-I70.0) and Emphysema (ICD10-J43.9). Electronically Signed   By: Eddie Candle M.D.   On: 09/30/2020 11:51    Assessment/Plan Principal Problem:   Hemoptysis Active Problems:   Cavitary pneumonia   GI bleed   Hemorrhagic shock (HCC)   Acute blood loss anemia   Persistent hemoptysis Likely due to cavitary pneumonia.   -Admit to progressive care unit.  PCCM consulted at [facility] and recommending consulting interventional radiology in the morning.  Acute GI bleed CT showing contrast extravasation at the anus consistent with hemorrhoidal bleeding or other very distal etiology of GI bleed.  GI at [place] did not feel the need for acute intervention.  Patient had moderate to large amount melanotic stool on arrival to [place].  I have sent a message to on-call provider for [facility] Dr. Ardis Hughs requesting consultation in the morning.  Hemorrhagic shock Multifactorial from hemoptysis and acute GI bleed.  Received 2 units PRBCs and a total of 3.5 L fluid boluses for hypotension prior to arrival to [facility].  Overall vital signs have improved significantly. Blood pressure low with systolic in the 46O but not tachycardic.  Lactic acidosis improved significantly with IV fluid resuscitation. -Continue IV fluid resuscitation and monitor H&H  Acute blood loss anemia. Likely multifactorial from hemoptysis and acute GI bleed. Hemoglobin 8.0 (was 10.6 on 09/28/2020 and previously as high as 13.8 on 09/06/2020).  Patient was given 2 units PRBCs at [facility].  -Continue to monitor H&H every 6 hours.  Transfuse if hemoglobin is less than 8.  Cavitary pneumonia-MRSA and Candida positive Slow to respond to treatment.  No fever or leukocytosis.  Seen by infectious disease during recent hospitalization, prescribed linezolid and Flagyl. -Continue antibiotics.  Blood culture x2 pending.   PCCM consulted.  Consult infectious disease in the morning.  May need thoracic surgery consult for failure to respond to conservative ID treatment.  AKI Most likely prerenal. BUN 88, creatinine 1.5 (was 0.3 on 09/27/2020). -IV fluid hydration and continue to monitor renal function.  Avoid nephrotoxic agents.  Elevated liver enzymes AST 296, ALT 179, alk phos 155, T bili 1.0.  Alk phos was previously mildly elevated but remainder of LFTs were normal on 09/18/2020.  CT showing small gallstones but no other acute abnormality. -Hepatitis panel, monitor LFTs  Thrombocytopenia -Monitor CBC every 6 hours.  Transfuse platelets if count drops below 50K.  Mild high anion gap metabolic acidosis -IV fluid hydration, continue to monitor  COPD No signs of acute exacerbation. -Continue Dulera, albuterol as needed  Chronic HFpEF Last echo 08/28/2020 with EF 55 to 47%, grade 1 diastolic dysfunction.  No signs of decompensation at this time. -Monitor volume status closely  Rheumatoid arthritis No acute joint flare. -Continue oral prednisone  Abnormal EKG EKG with sinus tachycardia, RBBB, LAFB,, and machine reading it as acute MI.  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  ACS less likely as high-sensitivity troponin mildly elevated but stable 78 >64.    Infrarenal abdominal aortic aneurysm -Seen on CT and radiologist recommending follow-up ultrasound every 3 years.  DVT prophylaxis: SCDs Code Status: Patient wishes to be full code. Family Communication: No family available at this time. Disposition Plan: Status is: Inpatient  Remains inpatient appropriate because:Hemodynamically unstable, Ongoing diagnostic testing needed not appropriate for outpatient work up, and Inpatient level of care appropriate due to severity of illness  Dispo: The patient is from: Home              Anticipated d/c is to: Home              Patient currently is not medically stable to d/c.   Difficult to place patient  No  Consults called: PCCM Level of care: Progressive  The medical decision making on this patient was of high complexity and the patient is at high risk for clinical deterioration, therefore this is a level 3 visit  Shela Leff MD [facility]  If 7PM-7AM, please contact night-coverage [email or web address]  10/01/2020, 12:26 AM

## 2020-09-30 NOTE — ED Notes (Signed)
Gastroenterologist at bedside.

## 2020-09-30 NOTE — Progress Notes (Signed)
Reviewed case. Bleeding mycetoma. Potentially would need IR ablation, not sure he is a resection candidate. Bring to [facility] progressive and pulm can consult.  Erskine Emery MD PCCM

## 2020-09-30 NOTE — ED Notes (Signed)
Pt coughing blood tinged mucous. Will try PRN tessalon.

## 2020-09-30 NOTE — ED Notes (Signed)
Pt accepted to [facility] [facility], Optometrist, call [phone] for report. [facility] in route to transport.

## 2020-09-30 NOTE — ED Notes (Signed)
Spoke with Katharine Look, RN, charge nurse at [facility] on cardiac progressive care. She stated she thinks pt may need higher level of care and suggested repeat lactic 2 more times because last was still significantly elevated. Sent msg to attending. Call back number is [phone]. CN made aware.

## 2020-10-01 ENCOUNTER — Encounter (HOSPITAL_COMMUNITY)
Admission: AD | Disposition: A | Discharge status: Home Health | Payer: Self-pay | Source: Other Acute Inpatient Hospital | Attending: Student

## 2020-10-01 ENCOUNTER — Inpatient Hospital Stay (HOSPITAL_COMMUNITY): Payer: Medicaid Other

## 2020-10-01 ENCOUNTER — Encounter (HOSPITAL_COMMUNITY): Payer: Self-pay | Admitting: Internal Medicine

## 2020-10-01 ENCOUNTER — Inpatient Hospital Stay (HOSPITAL_COMMUNITY): Payer: Medicaid Other | Admitting: Certified Registered"

## 2020-10-01 DIAGNOSIS — I864 Gastric varices: Secondary | ICD-10-CM

## 2020-10-01 DIAGNOSIS — Z7952 Long term (current) use of systemic steroids: Secondary | ICD-10-CM

## 2020-10-01 DIAGNOSIS — M0500 Felty's syndrome, unspecified site: Secondary | ICD-10-CM

## 2020-10-01 DIAGNOSIS — T380X5A Adverse effect of glucocorticoids and synthetic analogues, initial encounter: Secondary | ICD-10-CM

## 2020-10-01 DIAGNOSIS — D62 Acute posthemorrhagic anemia: Secondary | ICD-10-CM

## 2020-10-01 DIAGNOSIS — K922 Gastrointestinal hemorrhage, unspecified: Secondary | ICD-10-CM

## 2020-10-01 DIAGNOSIS — R578 Other shock: Secondary | ICD-10-CM

## 2020-10-01 DIAGNOSIS — J984 Other disorders of lung: Secondary | ICD-10-CM

## 2020-10-01 DIAGNOSIS — D84821 Immunodeficiency due to drugs: Secondary | ICD-10-CM

## 2020-10-01 DIAGNOSIS — K921 Melena: Secondary | ICD-10-CM

## 2020-10-01 DIAGNOSIS — K2100 Gastro-esophageal reflux disease with esophagitis, without bleeding: Secondary | ICD-10-CM

## 2020-10-01 DIAGNOSIS — J189 Pneumonia, unspecified organism: Secondary | ICD-10-CM

## 2020-10-01 DIAGNOSIS — R7881 Bacteremia: Secondary | ICD-10-CM

## 2020-10-01 HISTORY — PX: IR ANGIOGRAM VISCERAL SELECTIVE: IMG657

## 2020-10-01 HISTORY — PX: HEMOSTASIS CLIP PLACEMENT: SHX6857

## 2020-10-01 HISTORY — PX: IR ANGIOGRAM SELECTIVE EACH ADDITIONAL VESSEL: IMG667

## 2020-10-01 HISTORY — PX: IR US GUIDE VASC ACCESS RIGHT: IMG2390

## 2020-10-01 HISTORY — PX: ESOPHAGOGASTRODUODENOSCOPY (EGD) WITH PROPOFOL: SHX5813

## 2020-10-01 LAB — POCT I-STAT 7, (LYTES, BLD GAS, ICA,H+H)
Acid-base deficit: 6 mmol/L — ABNORMAL HIGH (ref 0.0–2.0)
Acid-base deficit: 5 mmol/L — ABNORMAL HIGH (ref 0.0–2.0)
Bicarbonate: 22.3 mmol/L (ref 20.0–28.0)
Bicarbonate: 22.4 mmol/L (ref 20.0–28.0)
Calcium, Ion: 1.04 mmol/L — ABNORMAL LOW (ref 1.15–1.40)
Calcium, Ion: 1.16 mmol/L (ref 1.15–1.40)
HCT: 30 % — ABNORMAL LOW (ref 39.0–52.0)
HCT: 29 % — ABNORMAL LOW (ref 39.0–52.0)
Hemoglobin: 10.2 g/dL — ABNORMAL LOW (ref 13.0–17.0)
Hemoglobin: 9.9 g/dL — ABNORMAL LOW (ref 13.0–17.0)
O2 Saturation: 99 %
O2 Saturation: 96 %
Patient temperature: 34.5
Patient temperature: 97.6
Potassium: 4.7 mmol/L (ref 3.5–5.1)
Potassium: 4 mmol/L (ref 3.5–5.1)
Sodium: 141 mmol/L (ref 135–145)
Sodium: 143 mmol/L (ref 135–145)
TCO2: 24 mmol/L (ref 22–32)
TCO2: 24 mmol/L (ref 22–32)
pCO2 arterial: 52.1 mmHg — ABNORMAL HIGH (ref 32.0–48.0)
pCO2 arterial: 47.9 mmHg (ref 32.0–48.0)
pH, Arterial: 7.226 — ABNORMAL LOW (ref 7.350–7.450)
pH, Arterial: 7.276 — ABNORMAL LOW (ref 7.350–7.450)
pO2, Arterial: 158 mmHg — ABNORMAL HIGH (ref 83.0–108.0)
pO2, Arterial: 88 mmHg (ref 83.0–108.0)

## 2020-10-01 LAB — CBC
HCT: 19.4 % — ABNORMAL LOW (ref 39.0–52.0)
HCT: 15.6 % — ABNORMAL LOW (ref 39.0–52.0)
HCT: 34.6 % — ABNORMAL LOW (ref 39.0–52.0)
Hemoglobin: 6.7 g/dL — CL (ref 13.0–17.0)
Hemoglobin: 5.1 g/dL — CL (ref 13.0–17.0)
Hemoglobin: 11.7 g/dL — ABNORMAL LOW (ref 13.0–17.0)
MCH: 29.6 pg (ref 26.0–34.0)
MCH: 28.7 pg (ref 26.0–34.0)
MCH: 30.3 pg (ref 26.0–34.0)
MCHC: 34.5 g/dL (ref 30.0–36.0)
MCHC: 32.7 g/dL (ref 30.0–36.0)
MCHC: 33.8 g/dL (ref 30.0–36.0)
MCV: 85.8 fL (ref 80.0–100.0)
MCV: 87.6 fL (ref 80.0–100.0)
MCV: 89.6 fL (ref 80.0–100.0)
Platelets: 84 10*3/uL — ABNORMAL LOW (ref 150–400)
Platelets: 85 10*3/uL — ABNORMAL LOW (ref 150–400)
Platelets: 90 10*3/uL — ABNORMAL LOW (ref 150–400)
RBC: 2.26 MIL/uL — ABNORMAL LOW (ref 4.22–5.81)
RBC: 1.78 MIL/uL — ABNORMAL LOW (ref 4.22–5.81)
RBC: 3.86 MIL/uL — ABNORMAL LOW (ref 4.22–5.81)
RDW: 18.9 % — ABNORMAL HIGH (ref 11.5–15.5)
RDW: 19.1 % — ABNORMAL HIGH (ref 11.5–15.5)
RDW: 16.5 % — ABNORMAL HIGH (ref 11.5–15.5)
WBC: 4.5 10*3/uL (ref 4.0–10.5)
WBC: 4.5 10*3/uL (ref 4.0–10.5)
WBC: 6.2 10*3/uL (ref 4.0–10.5)
nRBC: 2.2 % — ABNORMAL HIGH (ref 0.0–0.2)
nRBC: 2.4 % — ABNORMAL HIGH (ref 0.0–0.2)
nRBC: 2.7 % — ABNORMAL HIGH (ref 0.0–0.2)

## 2020-10-01 LAB — BPAM RBC
Blood Product Expiration Date: 202207052359
Blood Product Expiration Date: 202207152359
ISSUE DATE / TIME: 202206260922
ISSUE DATE / TIME: 202206260922
Unit Type and Rh: 5100
Unit Type and Rh: 9500

## 2020-10-01 LAB — TYPE AND SCREEN
ABO/RH(D): A POS
Antibody Screen: NEGATIVE
Unit division: 0
Unit division: 0

## 2020-10-01 LAB — COMPREHENSIVE METABOLIC PANEL
ALT: 214 U/L — ABNORMAL HIGH (ref 0–44)
AST: 298 U/L — ABNORMAL HIGH (ref 15–41)
Albumin: 1.4 g/dL — ABNORMAL LOW (ref 3.5–5.0)
Alkaline Phosphatase: 97 U/L (ref 38–126)
Anion gap: 4 — ABNORMAL LOW (ref 5–15)
BUN: 59 mg/dL — ABNORMAL HIGH (ref 6–20)
CO2: 22 mmol/L (ref 22–32)
Calcium: 6.9 mg/dL — ABNORMAL LOW (ref 8.9–10.3)
Chloride: 112 mmol/L — ABNORMAL HIGH (ref 98–111)
Creatinine, Ser: 0.64 mg/dL (ref 0.61–1.24)
GFR, Estimated: >60 mL/min (ref >60)
Glucose, Bld: 153 mg/dL — ABNORMAL HIGH (ref 70–99)
Potassium: 3.8 mmol/L (ref 3.5–5.1)
Sodium: 138 mmol/L (ref 135–145)
Total Bilirubin: 0.6 mg/dL (ref 0.3–1.2)
Total Protein: 3.4 g/dL — ABNORMAL LOW (ref 6.5–8.1)

## 2020-10-01 LAB — TROPONIN I (HIGH SENSITIVITY): Troponin I (High Sensitivity): 30 ng/L — ABNORMAL HIGH (ref <18)

## 2020-10-01 LAB — POCT I-STAT, CHEM 8
BUN: 57 mg/dL — ABNORMAL HIGH (ref 6–20)
Calcium, Ion: 1.12 mmol/L — ABNORMAL LOW (ref 1.15–1.40)
Chloride: 110 mmol/L (ref 98–111)
Creatinine, Ser: 0.6 mg/dL — ABNORMAL LOW (ref 0.61–1.24)
Glucose, Bld: 88 mg/dL (ref 70–99)
HCT: 15 % — ABNORMAL LOW (ref 39.0–52.0)
Hemoglobin: 5.1 g/dL — CL (ref 13.0–17.0)
Potassium: 3.9 mmol/L (ref 3.5–5.1)
Sodium: 143 mmol/L (ref 135–145)
TCO2: 22 mmol/L (ref 22–32)

## 2020-10-01 LAB — MRSA NEXT GEN BY PCR, NASAL: MRSA by PCR Next Gen: DETECTED — AB

## 2020-10-01 LAB — PREPARE RBC (CROSSMATCH)

## 2020-10-01 LAB — HEPATITIS PANEL, ACUTE
HCV Ab: NONREACTIVE
Hep A IgM: NONREACTIVE
Hep B C IgM: NONREACTIVE
Hepatitis B Surface Ag: NONREACTIVE

## 2020-10-01 LAB — HEMOGLOBIN AND HEMATOCRIT, BLOOD
HCT: 18.8 % — ABNORMAL LOW (ref 39.0–52.0)
Hemoglobin: 6.3 g/dL — CL (ref 13.0–17.0)

## 2020-10-01 LAB — BASIC METABOLIC PANEL
Anion gap: 8 (ref 5–15)
BUN: 52 mg/dL — ABNORMAL HIGH (ref 6–20)
CO2: 21 mmol/L — ABNORMAL LOW (ref 22–32)
Calcium: 7.2 mg/dL — ABNORMAL LOW (ref 8.9–10.3)
Chloride: 112 mmol/L — ABNORMAL HIGH (ref 98–111)
Creatinine, Ser: 0.56 mg/dL — ABNORMAL LOW (ref 0.61–1.24)
GFR, Estimated: >60 mL/min (ref >60)
Glucose, Bld: 151 mg/dL — ABNORMAL HIGH (ref 70–99)
Potassium: 4.1 mmol/L (ref 3.5–5.1)
Sodium: 141 mmol/L (ref 135–145)

## 2020-10-01 LAB — GLUCOSE, CAPILLARY
Glucose-Capillary: 133 mg/dL — ABNORMAL HIGH (ref 70–99)
Glucose-Capillary: 122 mg/dL — ABNORMAL HIGH (ref 70–99)
Glucose-Capillary: 168 mg/dL — ABNORMAL HIGH (ref 70–99)

## 2020-10-01 LAB — OCCULT BLOOD X 1 CARD TO LAB, STOOL: Fecal Occult Bld: POSITIVE — AB

## 2020-10-01 IMAGING — DX DG CHEST 1V PORT
2 series · 2 of 2 positions shown · non-contrast
Comparison: [DATE].

CLINICAL DATA: Intubation.

EXAM:
PORTABLE CHEST 1 VIEW

[chest ap (1 of 2)]
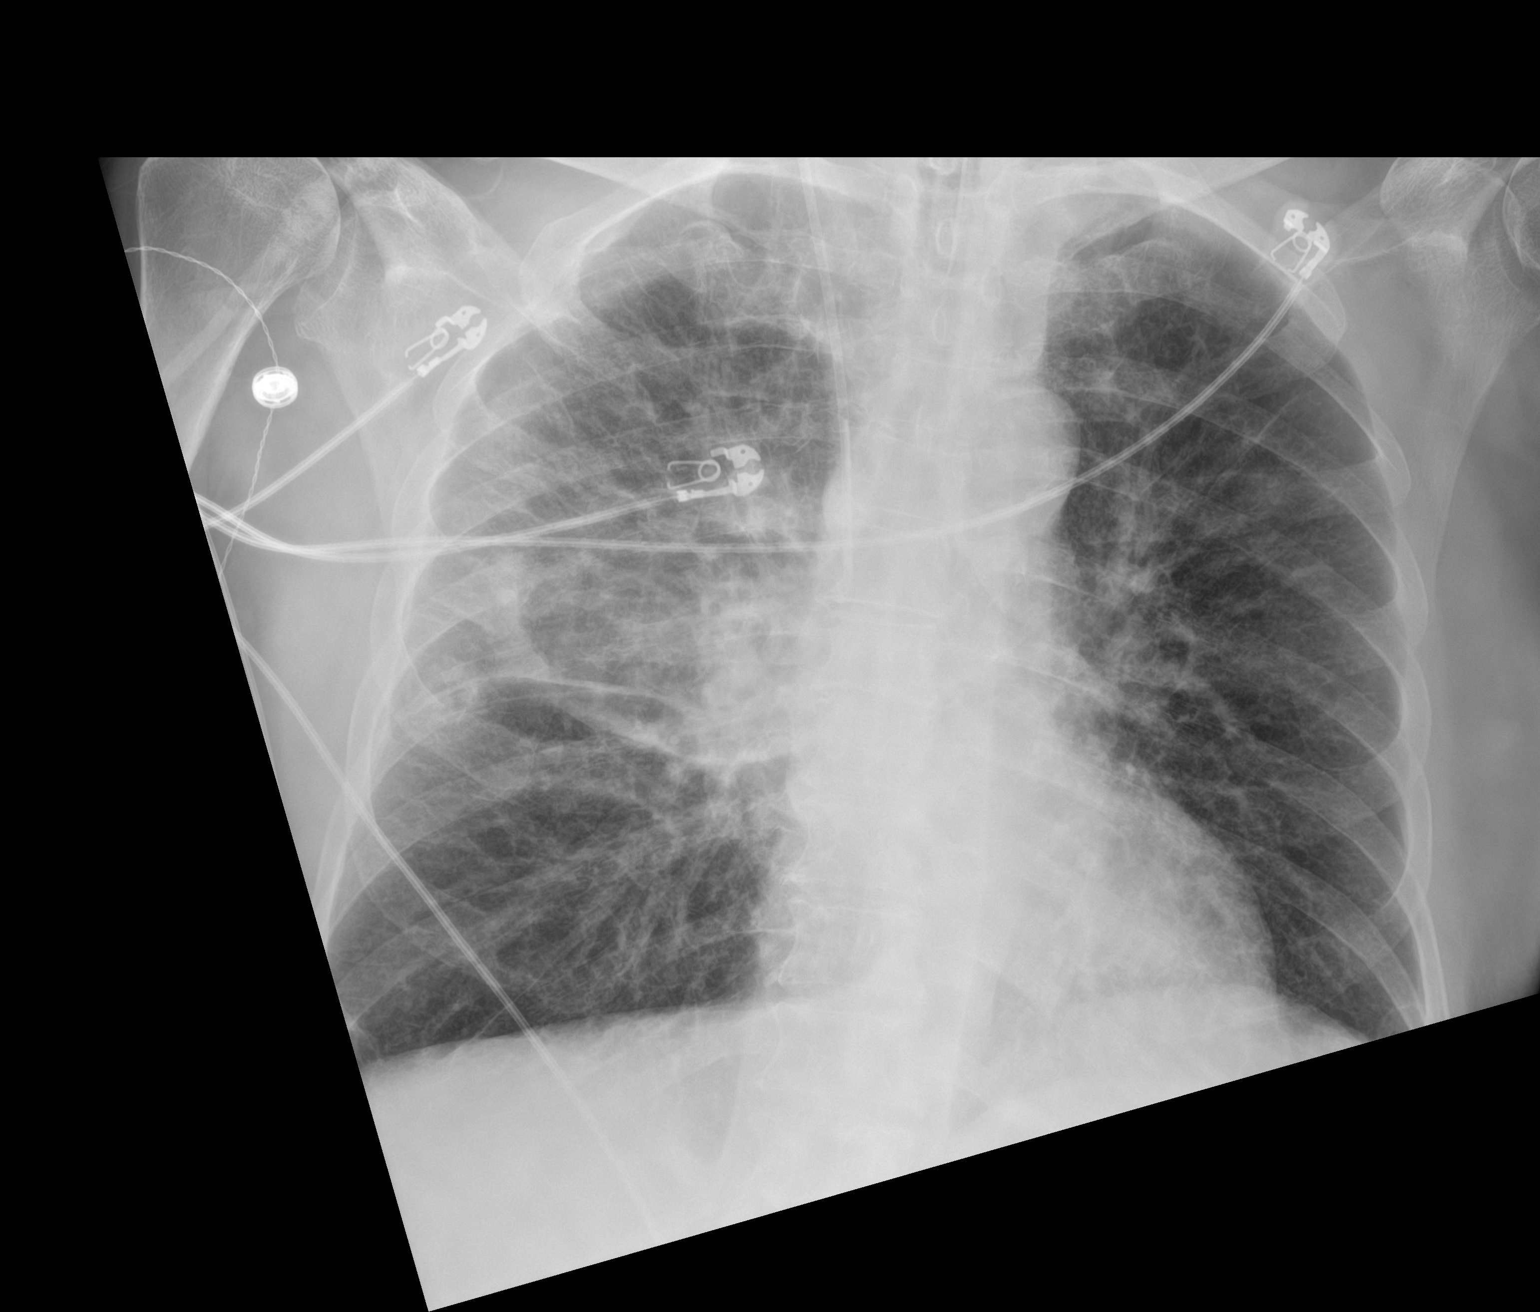

[chest ap (2 of 2)]
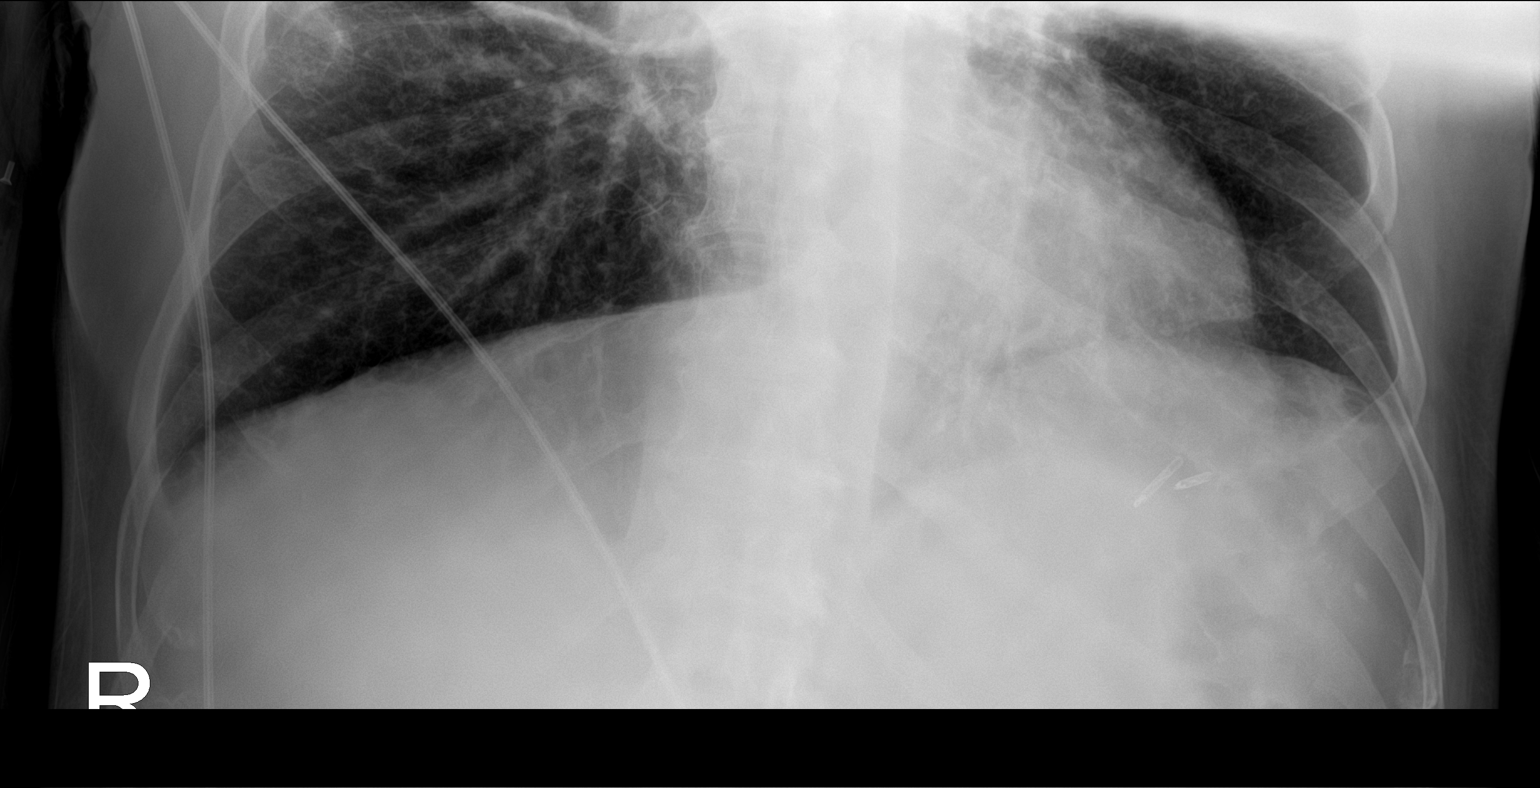

[2 of 2 positions shown; findings below may reference images not displayed]

FINDINGS: The heart size and mediastinal contours are within normal limits.
Endotracheal tube appears to be in good position. Right internal
jugular catheter is noted with tip in expected position of the SVC.
Right upper lobe airspace opacity is noted concerning for pneumonia.
The visualized skeletal structures are unremarkable.
IMPRESSION: Endotracheal tube and right internal jugular catheter in grossly
good position. Stable right lung opacity.

## 2020-10-01 SURGERY — IR WITH ANESTHESIA
Anesthesia: General

## 2020-10-01 SURGERY — ESOPHAGOGASTRODUODENOSCOPY (EGD) WITH PROPOFOL
Anesthesia: General

## 2020-10-01 SURGERY — SIGMOIDOSCOPY, FLEXIBLE
Anesthesia: Monitor Anesthesia Care

## 2020-10-01 MED ORDER — SODIUM CHLORIDE 0.9 % IV SOLN
50.0000 ug/h | INTRAVENOUS | Status: AC
  Administered 2020-10-01 – 2020-10-04 (×7): 50 ug/h via INTRAVENOUS
  Filled 2020-10-01 (×7): qty 1

## 2020-10-01 MED ORDER — PREDNISONE 10 MG PO TABS
10.0000 mg | ORAL_TABLET | Freq: Two times a day (BID) | ORAL | Status: DC
  Administered 2020-10-01: 10 mg via ORAL
  Filled 2020-10-01: qty 1

## 2020-10-01 MED ORDER — MIDAZOLAM HCL 2 MG/2ML IJ SOLN
2.0000 mg | INTRAMUSCULAR | Status: DC | PRN
  Administered 2020-10-01 – 2020-10-02 (×2): 2 mg via INTRAVENOUS
  Filled 2020-10-01 (×3): qty 2

## 2020-10-01 MED ORDER — IOHEXOL 300 MG/ML  SOLN
100.0000 mL | Freq: Once | INTRAMUSCULAR | Status: AC | PRN
  Administered 2020-10-01: 80 mL

## 2020-10-01 MED ORDER — VANCOMYCIN HCL 750 MG/150ML IV SOLN
750.0000 mg | Freq: Two times a day (BID) | INTRAVENOUS | Status: DC
  Administered 2020-10-01 – 2020-10-04 (×5): 750 mg via INTRAVENOUS
  Filled 2020-10-01 (×7): qty 150

## 2020-10-01 MED ORDER — LACTATED RINGERS IV BOLUS
1000.0000 mL | Freq: Once | INTRAVENOUS | Status: AC
  Administered 2020-10-01: 1000 mL via INTRAVENOUS

## 2020-10-01 MED ORDER — PHENYLEPHRINE HCL-NACL 10-0.9 MG/250ML-% IV SOLN
INTRAVENOUS | Status: DC | PRN
  Administered 2020-10-01: 50 ug/min via INTRAVENOUS

## 2020-10-01 MED ORDER — PROPOFOL 1000 MG/100ML IV EMUL
0.0000 ug/kg/min | INTRAVENOUS | Status: DC
  Administered 2020-10-01: 10 ug/kg/min via INTRAVENOUS
  Filled 2020-10-01: qty 100

## 2020-10-01 MED ORDER — FENTANYL CITRATE (PF) 100 MCG/2ML IJ SOLN
50.0000 ug | INTRAMUSCULAR | Status: DC | PRN
  Administered 2020-10-01 (×3): 100 ug via INTRAVENOUS
  Administered 2020-10-01 – 2020-10-02 (×2): 50 ug via INTRAVENOUS
  Filled 2020-10-01: qty 2
  Filled 2020-10-01: qty 4

## 2020-10-01 MED ORDER — IOHEXOL 300 MG/ML  SOLN
100.0000 mL | Freq: Once | INTRAMUSCULAR | Status: AC | PRN
  Administered 2020-10-01: 30 mL

## 2020-10-01 MED ORDER — SODIUM CHLORIDE 0.9 % IV SOLN: INTRAVENOUS | Status: DC

## 2020-10-01 MED ORDER — SODIUM CHLORIDE 0.9 % IV SOLN: INTRAVENOUS | Status: AC

## 2020-10-01 MED ORDER — CHLORHEXIDINE GLUCONATE CLOTH 2 % EX PADS
6.0000 | MEDICATED_PAD | Freq: Every day | CUTANEOUS | Status: DC
  Administered 2020-10-01 – 2020-10-03 (×4): 6 via TOPICAL

## 2020-10-01 MED ORDER — METRONIDAZOLE 500 MG PO TABS
500.0000 mg | ORAL_TABLET | Freq: Two times a day (BID) | ORAL | Status: DC
  Administered 2020-10-01 (×2): 500 mg via ORAL
  Filled 2020-10-01 (×2): qty 1

## 2020-10-01 MED ORDER — FENTANYL 2500MCG IN NS 250ML (10MCG/ML) PREMIX INFUSION
50.0000 ug/h | INTRAVENOUS | Status: DC
  Administered 2020-10-01: 175 ug/h via INTRAVENOUS
  Filled 2020-10-01: qty 250

## 2020-10-01 MED ORDER — ORAL CARE MOUTH RINSE
15.0000 mL | OROMUCOSAL | Status: DC
  Administered 2020-10-01 – 2020-10-02 (×10): 15 mL via OROMUCOSAL

## 2020-10-01 MED ORDER — LINEZOLID 600 MG/300ML IV SOLN
600.0000 mg | Freq: Two times a day (BID) | INTRAVENOUS | Status: DC
  Administered 2020-10-01: 600 mg via INTRAVENOUS
  Filled 2020-10-01: qty 300

## 2020-10-01 MED ORDER — PHENYLEPHRINE 40 MCG/ML (10ML) SYRINGE FOR IV PUSH (FOR BLOOD PRESSURE SUPPORT)
PREFILLED_SYRINGE | INTRAVENOUS | Status: DC | PRN
  Administered 2020-10-01: 120 ug via INTRAVENOUS

## 2020-10-01 MED ORDER — ARFORMOTEROL TARTRATE 15 MCG/2ML IN NEBU
15.0000 ug | INHALATION_SOLUTION | Freq: Two times a day (BID) | RESPIRATORY_TRACT | Status: DC
  Administered 2020-10-01 – 2020-10-07 (×14): 15 ug via RESPIRATORY_TRACT
  Filled 2020-10-01 (×15): qty 2

## 2020-10-01 MED ORDER — SODIUM CHLORIDE 0.9 % IV SOLN
2.0000 g | INTRAVENOUS | Status: DC
  Administered 2020-10-02 – 2020-10-03 (×2): 2 g via INTRAVENOUS
  Filled 2020-10-01 (×2): qty 20

## 2020-10-01 MED ORDER — FENTANYL 2500MCG IN NS 250ML (10MCG/ML) PREMIX INFUSION: 0.0000 ug/h | INTRAVENOUS | Status: DC

## 2020-10-01 MED ORDER — ONDANSETRON HCL 4 MG/2ML IJ SOLN
INTRAMUSCULAR | Status: DC | PRN
  Administered 2020-10-01: 4 mg via INTRAVENOUS

## 2020-10-01 MED ORDER — SODIUM CHLORIDE 0.9 % IV SOLN
2.0000 g | Freq: Once | INTRAVENOUS | Status: AC
  Administered 2020-10-01: 2 g via INTRAVENOUS
  Filled 2020-10-01: qty 2

## 2020-10-01 MED ORDER — CHLORHEXIDINE GLUCONATE 0.12% ORAL RINSE (MEDLINE KIT)
15.0000 mL | Freq: Two times a day (BID) | OROMUCOSAL | Status: DC
  Administered 2020-10-01 – 2020-10-02 (×2): 15 mL via OROMUCOSAL

## 2020-10-01 MED ORDER — METRONIDAZOLE 500 MG/100ML IV SOLN
500.0000 mg | Freq: Three times a day (TID) | INTRAVENOUS | Status: DC
  Administered 2020-10-01 – 2020-10-04 (×8): 500 mg via INTRAVENOUS
  Filled 2020-10-01 (×7): qty 100

## 2020-10-01 MED ORDER — SUCCINYLCHOLINE CHLORIDE 20 MG/ML IJ SOLN
INTRAMUSCULAR | Status: DC | PRN
  Administered 2020-10-01: 120 mg via INTRAVENOUS

## 2020-10-01 MED ORDER — SODIUM CHLORIDE 0.9% IV SOLUTION: Freq: Once | INTRAVENOUS | Status: DC

## 2020-10-01 MED ORDER — ROCURONIUM BROMIDE 10 MG/ML (PF) SYRINGE
PREFILLED_SYRINGE | INTRAVENOUS | Status: DC | PRN
  Administered 2020-10-01: 100 mg via INTRAVENOUS

## 2020-10-01 MED ORDER — ALBUTEROL SULFATE (2.5 MG/3ML) 0.083% IN NEBU
2.5000 mg | INHALATION_SOLUTION | Freq: Four times a day (QID) | RESPIRATORY_TRACT | Status: DC | PRN
  Administered 2020-10-02: 2.5 mg via RESPIRATORY_TRACT
  Filled 2020-10-01: qty 3

## 2020-10-01 MED ORDER — MUPIROCIN 2 % EX OINT
1.0000 "application " | TOPICAL_OINTMENT | Freq: Two times a day (BID) | CUTANEOUS | Status: AC
  Administered 2020-10-01 – 2020-10-06 (×10): 1 via NASAL
  Filled 2020-10-01: qty 22

## 2020-10-01 MED ORDER — SODIUM CHLORIDE 0.9% IV SOLUTION: Freq: Once | INTRAVENOUS | Status: AC

## 2020-10-01 MED ORDER — MOMETASONE FURO-FORMOTEROL FUM 100-5 MCG/ACT IN AERO
2.0000 | INHALATION_SPRAY | Freq: Two times a day (BID) | RESPIRATORY_TRACT | Status: DC
  Filled 2020-10-01: qty 8.8

## 2020-10-01 MED ORDER — LIDOCAINE 2% (20 MG/ML) 5 ML SYRINGE
INTRAMUSCULAR | Status: DC | PRN
  Administered 2020-10-01: 100 mg via INTRAVENOUS

## 2020-10-01 MED ORDER — VANCOMYCIN HCL 1000 MG/200ML IV SOLN
1000.0000 mg | Freq: Once | INTRAVENOUS | Status: AC
  Filled 2020-10-01: qty 200

## 2020-10-01 MED ORDER — METHYLPREDNISOLONE SODIUM SUCC 125 MG IJ SOLR
INTRAMUSCULAR | Status: AC
  Filled 2020-10-01: qty 2

## 2020-10-01 MED ORDER — DEXAMETHASONE SODIUM PHOSPHATE 4 MG/ML IJ SOLN
INTRAMUSCULAR | Status: DC | PRN
  Administered 2020-10-01: 4 mg via INTRAVENOUS

## 2020-10-01 MED ORDER — ETOMIDATE 2 MG/ML IV SOLN
INTRAVENOUS | Status: DC | PRN
  Administered 2020-10-01: 12 mg via INTRAVENOUS

## 2020-10-01 MED ORDER — KETAMINE HCL 50 MG/5ML IJ SOSY
PREFILLED_SYRINGE | INTRAMUSCULAR | Status: AC
  Filled 2020-10-01: qty 5

## 2020-10-01 MED ORDER — FENTANYL CITRATE (PF) 100 MCG/2ML IJ SOLN
50.0000 ug | INTRAMUSCULAR | Status: DC | PRN
Start: 2020-10-01 — End: 2020-10-02

## 2020-10-01 MED ORDER — BENZONATATE 100 MG PO CAPS
200.0000 mg | ORAL_CAPSULE | Freq: Two times a day (BID) | ORAL | Status: DC | PRN
  Filled 2020-10-01: qty 2

## 2020-10-01 MED ORDER — HYDROCORTISONE NA SUCCINATE PF 100 MG IJ SOLR
50.0000 mg | Freq: Three times a day (TID) | INTRAMUSCULAR | Status: DC
  Administered 2020-10-01 – 2020-10-04 (×8): 50 mg via INTRAVENOUS
  Filled 2020-10-01 (×8): qty 2

## 2020-10-01 SURGICAL SUPPLY — 14 items

## 2020-10-01 NOTE — Consult Note (Addendum)
Consultation  Referring Provider: Dr. Roderic Palau  Primary Care Physician:  [name], FNP Primary Gastroenterologist: Althia Forts here, has been seen by [facility] clinic before      Reason for Consultation:     Coffee-ground emesis versus hemoptysis         HPI:   Marcus [name] is a 60 y.o. 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    At time of presentation in the ED there yesterday 09/30/2020 they described patient had recently been discharged after being evaluated for a large cavitary lesion in his right upper lung consistent with a MRSA infection.  The ED staff found coffee-ground emesis on the patient's clothes.  When asked patient described that he had been "spitting up blood and shitting blood", the symptoms had started on Friday, 09/28/2020.  He is currently on 2 to 3 L of O2 at home for COPD.  History of a splenectomy.  No previous EGD.  Also has history of a perforated pyloric channel ulcer that was surgically repaired in October 2021.  CTA resulted in no bleeding in chest but possible hemorrhoidal/anal outlet bleeding in the rectum.  Splenectomy in 2018.  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  It was discussed that patient would benefit from an EGD given his history of perforated duodenal ulcer in October which was H. pylori negative, but could also consider a flex sig given positive CTA findings from the rectum.    Today, the patient tells me that he just started coughing up blood and passing black stool after leaving the hospital with diagnosis of a new lung lesion.  Explains that every time that he urinates he has a bowel movement and it is typically black and sticky.  In fact, while I am in the room the nursing staff is  cleaning up a large amount of melena.  He tells me that he has having occasional lower abdominal discomfort.  Has chronic reflux which he treats with a PPI.  Denies use of NSAIDs but is on Prednisone daily for Rheumatoid arthritis.    Per nursing staff this is his second unit of blood since arriving at [facility], making this a total of 4 units.  His blood pressure has been wavering up and down for them.    Denies fever or chills.  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 measuring up to 3.2 x 3.1 cm--> given 2 units PRBCs, hemoglobin improved 8.9, started on IV Protonix 80 mg and infusion, given 3 L fluid boluses, vitals improved blood pressure 140/96, heart rate 104, respiratory rate 23, sent to [facility] progressive care unit for pulm consult  Past Medical History:  Diagnosis Date   COPD (chronic obstructive pulmonary  disease) ([place])    Felty syndrome (HCC)    Hernia, epigastric    Pancytopenia (HCC)    Seropositive rheumatoid arthritis ([place])    Tobacco use disorder     Past Surgical History:  Procedure Laterality Date   INCISIONAL HERNIA REPAIR  01/17/2020   Procedure: HERNIA REPAIR INCISIONAL AND Silvestre Gunner;  Surgeon: Kinsinger, Arta Bruce, MD;  Location: [facility] ORS;  Service: General;;   LAPAROTOMY N/A 01/17/2020   Procedure: EXPLORATORY LAPAROTOMY;  Surgeon: Mickeal Skinner, MD;   Location: [facility] ORS;  Service: General;  Laterality: N/A;    Family History  Problem Relation Age of Onset   CAD Brother    COPD Brother     Social History   Tobacco Use   Smoking status: Every Day    Packs/day: 1.00    Pack years: 0.00    Types: Cigarettes   Smokeless tobacco: Never  Substance Use Topics   Alcohol use: No    Alcohol/week: 0.0 standard drinks   Drug use: Yes    Types: Marijuana    Prior to Admission medications   Medication Sig Start Date End Date Taking? Authorizing Provider  albuterol (PROVENTIL) (2.5 MG/3ML) 0.083% nebulizer solution Take 3 mLs (2.5 mg total) by nebulization every 6 (six) hours as needed for wheezing or shortness of breath. 08/30/20   Kathie Dike, MD  albuterol (VENTOLIN HFA) 108 (90 Base) MCG/ACT inhaler Inhale 2 puffs into the lungs every 6 (six) hours as needed for wheezing or shortness of breath. 08/30/20   Kathie Dike, MD  benzonatate (TESSALON) 200 MG capsule Take 1 capsule (200 mg total) by mouth 2 (two) times daily as needed for up to 14 days for cough. 09/28/20 10/12/20  Nolberto Hanlon, MD  feeding supplement (ENSURE ENLIVE / ENSURE PLUS) LIQD Take 237 mLs by mouth 2 (two) times daily between meals. 08/04/20   Manuella Ghazi, Pratik D, DO  guaiFENesin (MUCINEX) 600 MG 12 hr tablet Take 1 tablet (600 mg total) by mouth 2 (two) times daily for 14 days. 09/28/20 10/12/20  Nolberto Hanlon, MD  linezolid (ZYVOX) 600 MG tablet Take 1 tablet (600 mg total) by mouth 2 (two) times daily for 21 days. 09/28/20 10/19/20  Nolberto Hanlon, MD  metroNIDAZOLE (FLAGYL) 500 MG tablet Take 1 tablet (500 mg total) by mouth 2 (two) times daily for 21 days. 09/28/20 10/19/20  Nolberto Hanlon, MD  mometasone-formoterol (DULERA) 100-5 MCG/ACT AERO Inhale 2 puffs into the lungs 2 (two) times daily. 08/30/20   Kathie Dike, MD  Multiple Vitamin (MULTIVITAMIN WITH MINERALS) TABS tablet Take 1 tablet by mouth daily. 09/29/20 10/29/20  Nolberto Hanlon, MD  mupirocin ointment (BACTROBAN) 2 % Place  into the nose 2 (two) times daily. 09/28/20   Nolberto Hanlon, MD  nicotine (NICODERM CQ - DOSED IN MG/24 HOURS) 21 mg/24hr patch Place 1 patch (21 mg total) onto the skin daily for 28 days. 09/29/20 10/27/20  Nolberto Hanlon, MD  pantoprazole (PROTONIX) 40 MG tablet Take 1 tablet (40 mg total) by mouth daily for 10 days. Patient not taking: No sig reported 07/02/20 10/04/20  Manuella Ghazi, Pratik D, DO  predniSONE (DELTASONE) 10 MG tablet Take 1 tablet (10 mg total) by mouth 2 (two) times daily with a meal. Patient not taking: No sig reported 08/30/20   Kathie Dike, MD    Current Facility-Administered Medications  Medication Dose Route Frequency Provider Last Rate Last Admin   0.9 %  sodium chloride infusion   Intravenous Continuous Shela Leff, MD 125 mL/hr  at 10/01/20 0042 New Bag at 10/01/20 0042   albuterol (PROVENTIL) (2.5 MG/3ML) 0.083% nebulizer solution 2.5 mg  2.5 mg Nebulization Q6H PRN Shela Leff, MD       arformoterol [facility]) nebulizer solution 15 mcg  15 mcg Nebulization BID Corey Harold, NP   15 mcg at 10/01/20 0831   benzonatate (TESSALON) capsule 200 mg  200 mg Oral BID PRN Shela Leff, MD       cefTRIAXone (ROCEPHIN) 2 g in sodium chloride 0.9 % 100 mL IVPB  2 g Intravenous Once Laren Everts, RPH 200 mL/hr at 10/01/20 0841 2 g at 10/01/20 0841   [START ON 10/02/2020] cefTRIAXone (ROCEPHIN) 2 g in sodium chloride 0.9 % 100 mL IVPB  2 g Intravenous Q24H Bryk, Veronda P, RPH       linezolid (ZYVOX) IVPB 600 mg  600 mg Intravenous Q12H Shela Leff, MD 300 mL/hr at 10/01/20 0447 600 mg at 10/01/20 0447   metroNIDAZOLE (FLAGYL) tablet 500 mg  500 mg Oral BID Shela Leff, MD   500 mg at 10/01/20 0847   pantoprazole (PROTONIX) injection 40 mg  40 mg Intravenous Q12H Shela Leff, MD   40 mg at 10/01/20 0830   predniSONE (DELTASONE) tablet 10 mg  10 mg Oral BID WC Shela Leff, MD   10 mg at 10/01/20 0843   tranexamic acid (CYKLOKAPRON) 1000 MG/10ML  nebulizer solution 500 mg  500 mg Nebulization Q8H Shela Leff, MD   5 mg at 10/01/20 9242    Allergies as of 09/30/2020 - Review Complete 09/30/2020  Allergen Reaction Noted   Levaquin [levofloxacin in d5w] Other (See Comments) 07/28/2015   Methotrexate Other (See Comments) 08/06/2015     Review of Systems:    Constitutional: No weight loss, fever or chills Skin: No rash  Cardiovascular: No chest pain Respiratory: +chronic SOB  Gastrointestinal: See HPI and otherwise negative Genitourinary: No dysuria  Neurological: No headache, dizziness or syncope Musculoskeletal: No new muscle or joint pain Hematologic: No bruising Psychiatric: No history of depression or anxiety    Physical Exam:  Vital signs in last 24 hours: Temp:  [97.7 F (36.5 C)-98 F (36.7 C)] 97.9 F (36.6 C) (06/27 0710) Pulse Rate:  [67-123] 89 (06/27 0710) Resp:  [14-41] 20 (06/27 0710) BP: (64-144)/(41-89) 123/67 (06/27 0710) SpO2:  [91 %-100 %] 99 % (06/27 0835) Weight:  [61.1 kg-63.5 kg] 61.1 kg (06/26 2145) Last BM Date: 09/30/20 General: Thin, cachectic, ill-appearing Caucasian male appears to be in NAD, alert and cooperative Head:  Normocephalic and atraumatic. Eyes:   PEERL, EOMI. No icterus. Conjunctiva pink. Ears:  Normal auditory acuity. Neck:  Supple Throat: Oral cavity and pharynx without inflammation, swelling or lesion.  Lungs: Respirations even and unlabored. Lungs clear to auscultation bilaterally.   No wheezes, crackles, or rhonchi.  Heart: Normal S1, S2. No MRG. Regular rate and rhythm. No peripheral edema, cyanosis or pallor.  Abdomen:  Soft, nondistended, nontender. No rebound or guarding. Normal bowel sounds. No appreciable masses or hepatomegaly. Rectal: Large amount of melenic stool visualized Msk:  Symmetrical without gross deformities. Peripheral pulses intact.  Extremities:  Without edema, no deformity or joint abnormality.  Neurologic:  Alert and  oriented x4;  grossly  normal neurologically.  Skin:   Dry and intact without significant lesions or rashes. Psychiatric: Demonstrates good judgement and reason without abnormal affect or behaviors.   LAB RESULTS: Recent Labs    09/30/20 1225 09/30/20 1759 10/01/20 0143 10/01/20 0544  WBC 8.2  --  4.5 4.5  HGB 8.9* 8.0* 6.7* 5.1*  HCT 25.8* 23.1* 19.4* 15.6*  PLT 77*  --  84* 85*   BMET Recent Labs    09/30/20 1053 10/01/20 0544  NA 136 138  K 5.4* 3.8  CL 101 112*  CO2 18* 22  GLUCOSE 94 153*  BUN 88* 59*  CREATININE 1.55* 0.64  CALCIUM 7.1* 6.9*   LFT Recent Labs    10/01/20 0544  PROT 3.4*  ALBUMIN 1.4*  AST 298*  ALT 214*  ALKPHOS 97  BILITOT 0.6   PT/INR Recent Labs    09/30/20 0939  LABPROT 19.1*  INR 1.6*    STUDIES: CT Angio Chest PE W and/or Wo Contrast  Result Date: 09/30/2020 CLINICAL DATA:  Hemoptysis, hematochezia, evaluate for GI bleed EXAM: CT ANGIOGRAPHY CHEST, ABDOMEN AND PELVIS TECHNIQUE: Non-contrast CT of the abdomen and pelvis initially obtained. Multidetector CT imaging through the chest, abdomen and pelvis was performed using the standard protocol during bolus administration of intravenous contrast. Multiplanar reconstructed images and MIPs were obtained and reviewed to evaluate the vascular anatomy. CONTRAST:  166mL OMNIPAQUE IOHEXOL 350 MG/ML SOLN COMPARISON:  09/17/2020 FINDINGS: CTA CHEST FINDINGS Cardiovascular: Preferential opacification of the thoracic aorta. Normal contour and caliber of the thoracic aorta. No evidence of aneurysm, dissection, or other acute aortic pathology. Scattered aortic atherosclerosis. No evidence of pulmonary embolism on this non tailored examination. Normal heart size. Three-vessel coronary artery calcifications. No pericardial effusion. Mediastinum/Nodes: No enlarged mediastinal, hilar, or axillary lymph nodes. Thyroid gland, trachea, and esophagus demonstrate no significant findings. Lungs/Pleura: Mild centrilobular emphysema.  Diffuse bilateral bronchial wall thickening. Redemonstrated relatively thick-walled cavitary lesion of the anterior right upper lobe containing soft tissue attenuation debris and measuring approximately 8.4 x 5.7 cm (series 4, image 48). No pleural effusion or pneumothorax. Musculoskeletal: No chest wall abnormality. No acute or significant osseous findings. Review of the MIP images confirms the above findings. 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 No evidence of rupture, dissection, or other acute aortic pathology. Standard branching pattern of the abdominal aorta, with solitary bilateral renal arteries. Moderate mixed calcific atherosclerosis, with atherosclerosis at the branch vessel origins without high-grade stenosis. Review of the MIP images confirms the above findings. NON-VASCULAR Hepatobiliary: No solid liver abnormality is seen. Small gallstones in the dependent gallbladder. Gallbladder wall thickening, or biliary dilatation. Pancreas: Unremarkable. No pancreatic ductal dilatation or surrounding inflammatory changes. Spleen: Normal in size without significant abnormality. Adrenals/Urinary Tract: Adrenal glands are unremarkable. Kidneys are normal, without renal calculi, solid lesion, or hydronephrosis. Bladder is unremarkable. Stomach/Bowel: Stomach is within normal limits. Appendix appears normal. No evidence of bowel wall thickening, distention, or inflammatory changes. There is contrast extravasation at the anus (series 6, image 85). Lymphatic: No enlarged abdominal or pelvic lymph nodes. Reproductive: No mass or other significant abnormality. Other: No abdominal wall hernia or abnormality. No abdominopelvic ascites. Musculoskeletal: No acute or significant osseous findings. Review of the MIP images confirms the above findings. IMPRESSION: 1. Redemonstrated, thick-walled cavitary lesion of the anterior  right upper lobe containing soft tissue attenuation history. 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 Cavitary malignancy is a significant differential consideration. 2. There is contrast extravasation at the anus, consistent with hemorrhoidal bleeding or other very distal etiology of GI bleed. 3. 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 No evidence of  rupture, dissection, or other acute aortic pathology. Recommend follow-up ultrasound every 3 years. This recommendation follows ACR consensus guidelines: White Paper of the ACR Incidental Findings Committee II on Vascular Findings. J Am Coll Radiol 2013; 10:789-794. 4. Emphysema. 5. Diffuse bilateral bronchial wall thickening, consistent with nonspecific infectious or inflammatory bronchitis. 6. Coronary artery disease. 7. Cholelithiasis. Aortic Atherosclerosis (ICD10-I70.0) and Emphysema (ICD10-J43.9). Electronically Signed   By: Eddie Candle M.D.   On: 09/30/2020 11:51   DG Chest Portable 1 View  Result Date: 09/30/2020 CLINICAL DATA:  Hemoptysis, GI bleed. EXAM: PORTABLE CHEST 1 VIEW COMPARISON:  September 25, 2020. FINDINGS: EKG leads project over the chest. Persistent opacity in the RIGHT mid chest in the area of the cavitary focus that was seen on prior CT imaging. Unchanged compared to September 25, 2020. No new areas of lobar consolidative changes. No pneumothorax.  No pleural effusion on frontal radiograph. No acute bone finding on limited assessment. IMPRESSION: 1. Persistent opacity in the RIGHT mid chest in the area of the cavitary focus that was seen on prior CT imaging. 2. Essentially no change from recent comparison. Electronically Signed   By: Zetta Bills M.D.   On: 09/30/2020 10:57   CT Angio Abd/Pel W and/or Wo Contrast  Result Date: 09/30/2020 CLINICAL DATA:  Hemoptysis, hematochezia, evaluate for GI bleed EXAM: CT ANGIOGRAPHY CHEST, ABDOMEN AND  PELVIS TECHNIQUE: Non-contrast CT of the abdomen and pelvis initially obtained. Multidetector CT imaging through the chest, abdomen and pelvis was performed using the standard protocol during bolus administration of intravenous contrast. Multiplanar reconstructed images and MIPs were obtained and reviewed to evaluate the vascular anatomy. CONTRAST:  153mL OMNIPAQUE IOHEXOL 350 MG/ML SOLN COMPARISON:  09/17/2020 FINDINGS: CTA CHEST FINDINGS Cardiovascular: Preferential opacification of the thoracic aorta. Normal contour and caliber of the thoracic aorta. No evidence of aneurysm, dissection, or other acute aortic pathology. Scattered aortic atherosclerosis. No evidence of pulmonary embolism on this non tailored examination. Normal heart size. Three-vessel coronary artery calcifications. No pericardial effusion. Mediastinum/Nodes: No enlarged mediastinal, hilar, or axillary lymph nodes. Thyroid gland, trachea, and esophagus demonstrate no significant findings. Lungs/Pleura: Mild centrilobular emphysema. Diffuse bilateral bronchial wall thickening. Redemonstrated relatively thick-walled cavitary lesion of the anterior right upper lobe containing soft tissue attenuation debris and measuring approximately 8.4 x 5.7 cm (series 4, image 48). No pleural effusion or pneumothorax. Musculoskeletal: No chest wall abnormality. No acute or significant osseous findings. Review of the MIP images confirms the above findings. 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 No evidence of rupture, dissection, or other acute aortic pathology. Standard branching pattern of the abdominal aorta, with solitary bilateral renal arteries. Moderate mixed calcific atherosclerosis, with atherosclerosis at the branch vessel origins without high-grade stenosis. Review of the MIP images confirms the above findings. NON-VASCULAR Hepatobiliary: No solid liver  abnormality is seen. Small gallstones in the dependent gallbladder. Gallbladder wall thickening, or biliary dilatation. Pancreas: Unremarkable. No pancreatic ductal dilatation or surrounding inflammatory changes. Spleen: Normal in size without significant abnormality. Adrenals/Urinary Tract: Adrenal glands are unremarkable. Kidneys are normal, without renal calculi, solid lesion, or hydronephrosis. Bladder is unremarkable. Stomach/Bowel: Stomach is within normal limits. Appendix appears normal. No evidence of bowel wall thickening, distention, or inflammatory changes. There is contrast extravasation at the anus (series 6, image 85). Lymphatic: No enlarged abdominal or pelvic lymph nodes. Reproductive: No mass or other significant abnormality. Other: No abdominal wall hernia or abnormality. No  abdominopelvic ascites. Musculoskeletal: No acute or significant osseous findings. Review of the MIP images confirms the above findings. IMPRESSION: 1. Redemonstrated, thick-walled cavitary lesion of the anterior right upper lobe containing soft tissue attenuation history. 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 Cavitary malignancy is a significant differential consideration. 2. There is contrast extravasation at the anus, consistent with hemorrhoidal bleeding or other very distal etiology of GI bleed. 3. 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 No evidence of rupture, dissection, or other acute aortic pathology. Recommend follow-up ultrasound every 3 years. This recommendation follows ACR consensus guidelines: White Paper of the ACR Incidental Findings Committee II on Vascular Findings. J Am Coll Radiol 2013; 10:789-794. 4. Emphysema. 5. Diffuse bilateral bronchial wall thickening, consistent with nonspecific infectious or inflammatory bronchitis. 6. Coronary artery disease. 7. Cholelithiasis. Aortic Atherosclerosis  (ICD10-I70.0) and Emphysema (ICD10-J43.9). Electronically Signed   By: Eddie Candle M.D.   On: 09/30/2020 11:51      Impression / Plan:   Impression: 1.  Persistent hemoptysis: Patient tells me last time that he coughed up any blood was about 5 hours ago, does have new diagnosis of cavitary pneumonia 2.  Acute GI bleed: Melena on exam, history of surgical repair of perforated pyloric channel ulcer in October 2021; concern for repeat ulcer 3.  Hemorrhagic shock 4.  Acute blood loss anemia: Currently receiving his fourth unit of PRBCs, last hemoglobin drawn at 544 this morning was 5.1 5.  Cavitary pneumonia: MRSA and Candida positive 6.  AKI 7.  Elevated LFTs 8.  Thrombocytopenia: Platelets currently 85 9.  COPD: on chronic oxygen 2 to 3 L at home 10.  Chronic heart failure: Last echo 08/28/2020 with EF 59-29%, grade 1 diastolic dysfunction 11.  Rheumatoid arthritis: On oral prednisone  Plan: 1.  Plan on proceeding with EGD and flex sigmoidoscopy today around 1130, this is assuming that hemoglobin has gone up after his last 2 units of PRBCs.  I have placed a stat hemoglobin check for the patient.  Did discuss risks, benefits, limitations and alternatives and the patient agrees to proceed. 2.  Ordered 2 tap water enemas to be given around 10:00 3.  Continue to monitor hemoglobin with transfusion as needed less than 7 4.  Agree with Pantoprazole 40 mg twice daily 5.  Please await any further recommendations from Dr. Silverio Decamp later today.  Thank you for your kind consultation, we will continue to follow.  Lavone Nian [facility]  10/01/2020, 8:53 AM   Attending physician's note   I have taken a history, examined the patient and reviewed the chart. I agree with the Advanced Practitioner's note, impression and recommendations.  60 year old gentleman with history of COPD, rheumatoid arthritis, Felty syndrome with large cavitary lesion in right lung, hemoptysis admitted with hemorrhagic shock,  acute blood loss anemia. He is having large melanotic stool History of peptic ulcer disease and perforated duodenal ulcer in October 2021  We will plan to proceed with EGD for further evaluation under general anesthesia, will need intubation to protect airway given hemoptysis He received 2 unit PRBC Await stat CBC s/p transfusion  CT angio showed possible extravasation in the rectum, no bright red blood per rectum. We will plan for flexible sigmoidoscopy to exclude any high risk lesion  Continue PPI  Further recommendation based on findings on EGD and flexible sigmoidoscopy The risks and benefits as well as alternatives of endoscopic procedure(s) have been discussed and reviewed.  All questions answered. The patient agrees to proceed.    The patient was provided an opportunity to ask questions and all were answered. The patient agreed with the plan and demonstrated an understanding of the instructions.  Damaris Hippo , MD [phone]

## 2020-10-01 NOTE — Consult Note (Signed)
[facility]    Date of Admission:  09/30/2020     Total days of antibiotics 12               Reason for Consult: Lung Abscess  Referring Provider: London Pepper Primary Care Provider: [name], FNP   ASSESSMENT:  [name] is a 60 y/o male admitted with hemoptysis with concern for GI bleed in the setting of multiple hospitalizations with acute respiratory issues and cavitary lesion with concern for MRSA being treated with Zyvox and metronidazole. GI performing EGD today. Would anticipate some improvement in cavitary lesion with antibiotics over the past month. Other differentials including possible malignancy remain a concern. He is asplenic and being treated with prednisone (albeit lower dose at 10 mg) for Rheumatoid arthritis which lowers his immunity. Given concern for GI bleed will change Zyvox to vancomycin and add ceftriaxone for coverage of Klebsiella bacteremia. Monitor renal function and vancomycin levels per protocol. Await EGD results.   PLAN:  Change antibiotics to vancomycin and ceftriaxone. Await EGD results. Anemia treatment per primary team as indicated.  Await sensitivities of blood cultures for Klebsiella.   Principal Problem:   Hemoptysis Active Problems:   Cavitary pneumonia   GI bleed   Hemorrhagic shock (HCC)   Acute blood loss anemia    [MAR Hold] sodium chloride   Intravenous Once   [MAR Hold] arformoterol  15 mcg Nebulization BID   [MAR Hold] hydrocortisone sod succinate (SOLU-CORTEF) inj  50 mg Intravenous Q8H   [MAR Hold] pantoprazole (PROTONIX) IV  40 mg Intravenous Q12H   [MAR Hold] tranexamic acid  500 mg Nebulization Q8H     HPI: Marcus [name] is a 60 y.o. male with previous medical history of COPD, Felty syndrome, seropositive rheumatoid arthritis, and pancytopenia and previous hospital admission at [facility] for cavitary pneumonia due to MRSA and Candida admitted with concern for GI bleeding.   Mr.  Marcus [name] has had several recent hospitalizations including from 08/22/20-08/30/20 with shortness of breath and 1 month of coughing where he was found to have cavitary lesion in the right upper lobe felt to be pneumonia and abscess secondary to MRSA which was in a sputum culture. Initially on Vancomycin and Zosyn and narrowed to doxycycline with plan for 4-6 weeks of treatment. Hospital course was complicated by adrenal insufficiency requiring doubling of his prednisone and neutropenia felt to be related to infection. Admitted again from 6/13-6/24 with hemoptysis, shortness of breath and abdominal pain. Diagnosed and treated for necrotizing pneumonia causing sepsis and acute on chronic respiratory failure. Antibiotics were changed to linezolid and metronidazole for 3 weeks. Respiratory cultures with Candida dublisiensis believed to be colonization.   [name] was presented to the ED on 6/26 with concern for GI bleed after being found lying in bloody vomit. Found to have hypotension and tachycardia. Hemoglobin dropped from 10.6 on 6/24 to 8 on 6/26. 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 Transferred to [facility] for further evaluation.   [name] has been afebrile since admission and WBC count was 4.5 . Continued on Linezolid and metronidazole from previous hospitalization. GI consulted and performing EGD today.  Hemoglobin down to 5.1 this morning. Blood cultures are positive in 1/4 for Klebsiella pneumoniae.   Review of Systems: Review of Systems  Constitutional:  Negative for chills, fever and weight loss.  Respiratory:  Positive for hemoptysis and shortness of breath. Negative for cough and wheezing.   Cardiovascular:  Negative for chest pain and leg swelling.  Gastrointestinal:  Negative  for abdominal pain, constipation, diarrhea, nausea and vomiting.  Skin:  Negative for rash.    Past Medical History:  Diagnosis Date   COPD (chronic obstructive pulmonary disease) (HCC)    Felty syndrome (HCC)    Hernia, epigastric    Pancytopenia (HCC)    Seropositive rheumatoid arthritis (HCC)    Tobacco use disorder     Social History   Tobacco Use   Smoking status: Every Day    Packs/day: 1.00    Pack years: 0.00    Types: Cigarettes   Smokeless tobacco: Never  Substance Use Topics   Alcohol use: No    Alcohol/week: 0.0 standard drinks   Drug use: Yes    Types: Marijuana    Family History  Problem Relation Age of Onset   CAD Brother    COPD Brother     Allergies  Allergen Reactions   Levaquin [Levofloxacin In D5w] Other (See Comments)    Chest pain   Methotrexate Other (See Comments)    Chest pain    OBJECTIVE: Blood pressure (!) 109/57, pulse 87, temperature 98.1 F (36.7 C), resp. rate (!) 21, height _0  (1.753 m), weight 61.1 kg, SpO2 100 %.  Physical Exam Constitutional:      General: He is not in acute distress.    Appearance: He is well-developed.  Cardiovascular:     Rate and Rhythm: Normal rate and regular rhythm.     Heart sounds: Normal heart sounds.  Pulmonary:     Effort: Pulmonary effort is normal.     Breath sounds: Normal breath sounds.  Abdominal:     General: Bowel sounds are normal. There is no distension.     Palpations: There is no mass.     Tenderness: There is no abdominal tenderness. There is no rebound.  Skin:    General: Skin is warm and dry.  Neurological:     Mental Status: He is alert and oriented to person, place, and time.  Psychiatric:        Mood and Affect: Mood normal.    Lab Results Lab Results  Component Value Date   WBC 4.5 10/01/2020   HGB 5.1 (LL) 10/01/2020   HCT 15.0 (L) 10/01/2020   MCV 87.6 10/01/2020   PLT 85 (L) 10/01/2020    Lab Results  Component Value Date   CREATININE 0.60 (L)  10/01/2020   BUN 57 (H) 10/01/2020   NA 143 10/01/2020   K 3.9 10/01/2020   CL 110 10/01/2020   CO2 22 10/01/2020    Lab Results  Component Value Date   ALT 214 (H) 10/01/2020   AST 298 (H) 10/01/2020   ALKPHOS 97 10/01/2020   BILITOT 0.6 10/01/2020     Microbiology: Recent Results (from the past 240 hour(s))  Aspergillus Ag, BAL/Serum     Status: None   Collection Time: 09/25/20 10:54 AM  Result Value Ref Range Status   Aspergillus Ag, BAL/Serum 0.04 0.00 - 0.49 Index Final    Comment: (NOTE) Performed At: [facility] [address] [place], [place] [id number] Rush Farmer MD PY:[id number]   Blood culture (routine x 2)     Status: None (Preliminary result)   Collection Time: 09/30/20  9:40 AM   Specimen: BLOOD  Result  Value Ref Range Status   Specimen Description BLOOD LEFT HAND  Final   Special Requests   Final    BOTTLES DRAWN AEROBIC AND ANAEROBIC Blood Culture adequate volume   Culture   Final    NO GROWTH < 24 HOURS Performed at [facility], [address] [place], [place] [address]    Report Status PENDING  Incomplete  Blood culture (routine x 2)     Status: None (Preliminary result)   Collection Time: 09/30/20  9:40 AM   Specimen: BLOOD  Result Value Ref Range Status   Specimen Description BLOOD RIGHT UPPER HAND  Final   Special Requests   Final    BOTTLES DRAWN AEROBIC AND ANAEROBIC Blood Culture adequate volume   Culture  Setup Time   Final    Organism ID to follow GRAM NEGATIVE RODS ANAEROBIC BOTTLE ONLY CRITICAL RESULT CALLED TO, READ BACK BY AND VERIFIED WITH: JAON ROBINS @ [id number] 09/30/2020 LFD Performed at [facility], [address] [place], [place] [address]    Culture GRAM NEGATIVE RODS  Final   Report Status PENDING  Incomplete  Blood Culture ID Panel (Reflexed)     Status: Abnormal   Collection Time: 09/30/20  9:40 AM  Result Value Ref Range Status   Enterococcus faecalis NOT DETECTED NOT DETECTED Final    Enterococcus Faecium NOT DETECTED NOT DETECTED Final   Listeria monocytogenes NOT DETECTED NOT DETECTED Final   Staphylococcus species NOT DETECTED NOT DETECTED Final   Staphylococcus aureus (BCID) NOT DETECTED NOT DETECTED Final   Staphylococcus epidermidis NOT DETECTED NOT DETECTED Final   Staphylococcus lugdunensis NOT DETECTED NOT DETECTED Final   Streptococcus species NOT DETECTED NOT DETECTED Final   Streptococcus agalactiae NOT DETECTED NOT DETECTED Final   Streptococcus pneumoniae NOT DETECTED NOT DETECTED Final   Streptococcus pyogenes NOT DETECTED NOT DETECTED Final   A.calcoaceticus-baumannii NOT DETECTED NOT DETECTED Final   Bacteroides fragilis NOT DETECTED NOT DETECTED Final   Enterobacterales DETECTED (A) NOT DETECTED Final    Comment: Enterobacterales represent a large order of gram negative bacteria, not a single organism. CRITICAL RESULT CALLED TO, READ BACK BY AND VERIFIED WITH: JASON ROBINS @ [id number] 09/30/20 LFD    Enterobacter cloacae complex NOT DETECTED NOT DETECTED Final   Escherichia coli NOT DETECTED NOT DETECTED Final   Klebsiella aerogenes NOT DETECTED NOT DETECTED Final   Klebsiella oxytoca NOT DETECTED NOT DETECTED Final   Klebsiella pneumoniae DETECTED (A) NOT DETECTED Final    Comment: CRITICAL RESULT CALLED TO, READ BACK BY AND VERIFIED WITH: JASON ROBINS _0  09/30/2020 LFD    Proteus species NOT DETECTED NOT DETECTED Final   Salmonella species NOT DETECTED NOT DETECTED Final   Serratia marcescens NOT DETECTED NOT DETECTED Final   Haemophilus influenzae NOT DETECTED NOT DETECTED Final   Neisseria meningitidis NOT DETECTED NOT DETECTED Final   Pseudomonas aeruginosa NOT DETECTED NOT DETECTED Final   Stenotrophomonas maltophilia NOT DETECTED NOT DETECTED Final   Candida albicans NOT DETECTED NOT DETECTED Final   Candida auris NOT DETECTED NOT DETECTED Final   Candida glabrata NOT DETECTED NOT DETECTED Final   Candida krusei NOT DETECTED NOT DETECTED  Final   Candida parapsilosis NOT DETECTED NOT DETECTED Final   Candida tropicalis NOT DETECTED NOT DETECTED Final   Cryptococcus neoformans/gattii NOT DETECTED NOT DETECTED Final   CTX-M ESBL NOT DETECTED NOT DETECTED Final   Carbapenem resistance IMP NOT DETECTED NOT DETECTED Final   Carbapenem resistance KPC NOT DETECTED NOT DETECTED  Final   Carbapenem resistance NDM NOT DETECTED NOT DETECTED Final   Carbapenem resist OXA 48 LIKE NOT DETECTED NOT DETECTED Final   Carbapenem resistance VIM NOT DETECTED NOT DETECTED Final    Comment: Performed at [facility], [address], [place], [place] [address]  Resp Panel by RT-PCR (Flu A&B, Covid) Nasopharyngeal Swab     Status: None   Collection Time: 09/30/20 10:16 AM   Specimen: Nasopharyngeal Swab; Nasopharyngeal(NP) swabs in vial transport medium  Result Value Ref Range Status   SARS Coronavirus 2 by RT PCR NEGATIVE NEGATIVE Final    Comment: (NOTE) SARS-CoV-2 target nucleic acids are NOT DETECTED.  The SARS-CoV-2 RNA is generally detectable in upper respiratory specimens during the acute phase of infection. The lowest concentration of SARS-CoV-2 viral copies this assay can detect is 138 copies/mL. A negative result does not preclude SARS-Cov-2 infection and should not be used as the sole basis for treatment or other patient management decisions. A negative result may occur with  improper specimen collection/handling, submission of specimen other than nasopharyngeal swab, presence of viral mutation(s) within the areas targeted by this assay, and inadequate number of viral copies(<138 copies/mL). A negative result must be combined with clinical observations, patient history, and epidemiological information. The expected result is Negative.  Fact Sheet for Patients:  EntrepreneurPulse.com.au  Fact Sheet for Healthcare Providers:  IncredibleEmployment.be  This test is no t yet approved or  cleared by the [place] FDA and  has been authorized for detection and/or diagnosis of SARS-CoV-2 by FDA under an Emergency Use Authorization (EUA). This EUA will remain  in effect (meaning this test can be used) for the duration of the COVID-19 declaration under Section 564(b)(1) of the Act, 21 U.S.C.section 360bbb-3(b)(1), unless the authorization is terminated  or revoked sooner.       Influenza A by PCR NEGATIVE NEGATIVE Final   Influenza B by PCR NEGATIVE NEGATIVE Final    Comment: (NOTE) The Xpert Xpress SARS-CoV-2/FLU/RSV plus assay is intended as an aid in the diagnosis of influenza from Nasopharyngeal swab specimens and should not be used as a sole basis for treatment. Nasal washings and aspirates are unacceptable for Xpert Xpress SARS-CoV-2/FLU/RSV testing.  Fact Sheet for Patients: EntrepreneurPulse.com.au  Fact Sheet for Healthcare Providers: IncredibleEmployment.be  This test is not yet approved or cleared by the [place] FDA and has been authorized for detection and/or diagnosis of SARS-CoV-2 by FDA under an Emergency Use Authorization (EUA). This EUA will remain in effect (meaning this test can be used) for the duration of the COVID-19 declaration under Section 564(b)(1) of the Act, 21 U.S.C. section 360bbb-3(b)(1), unless the authorization is terminated or revoked.  Performed at [facility], [address], [place], [place] [address]      Terri Piedra, [facility]  10/01/2020  1:29 PM

## 2020-10-01 NOTE — Progress Notes (Signed)
PHARMACY - PHYSICIAN COMMUNICATION CRITICAL VALUE ALERT - BLOOD CULTURE IDENTIFICATION (BCID)  Marcus [name] is an 60 y.o. male who presented to [facility] on 09/30/2020 with a chief complaint of hemoptysis.  Assessment:  Was recently admitted for cavitary PNA >> discharged w/ ABX Rx w/ plan to see ID as outpt f/u, returns to [facility] ED c/o hemoptysis, had not taken ABX after discharge 4d ago, now tx'd to [facility] for further w/u; K.pneumo is growing in 1 of 4 blood cx bottles.  Name of physician (or Provider) Contacted: DOrtiz MD  Current antibiotics: linezolid and metronidazole  Changes to prescribed antibiotics recommended:  Recommendations accepted by provider -- add Rocephin, consider ID consult.  Results for orders placed or performed during the hospital encounter of 09/30/20  Blood Culture ID Panel (Reflexed) (Collected: 09/30/2020  9:40 AM)  Result Value Ref Range   Enterococcus faecalis NOT DETECTED NOT DETECTED   Enterococcus Faecium NOT DETECTED NOT DETECTED   Listeria monocytogenes NOT DETECTED NOT DETECTED   Staphylococcus species NOT DETECTED NOT DETECTED   Staphylococcus aureus (BCID) NOT DETECTED NOT DETECTED   Staphylococcus epidermidis NOT DETECTED NOT DETECTED   Staphylococcus lugdunensis NOT DETECTED NOT DETECTED   Streptococcus species NOT DETECTED NOT DETECTED   Streptococcus agalactiae NOT DETECTED NOT DETECTED   Streptococcus pneumoniae NOT DETECTED NOT DETECTED   Streptococcus pyogenes NOT DETECTED NOT DETECTED   A.calcoaceticus-baumannii NOT DETECTED NOT DETECTED   Bacteroides fragilis NOT DETECTED NOT DETECTED   Enterobacterales DETECTED (A) NOT DETECTED   Enterobacter cloacae complex NOT DETECTED NOT DETECTED   Escherichia coli NOT DETECTED NOT DETECTED   Klebsiella aerogenes NOT DETECTED NOT DETECTED   Klebsiella oxytoca NOT DETECTED NOT DETECTED   Klebsiella pneumoniae DETECTED (A) NOT DETECTED   Proteus species NOT DETECTED NOT DETECTED   Salmonella species  NOT DETECTED NOT DETECTED   Serratia marcescens NOT DETECTED NOT DETECTED   Haemophilus influenzae NOT DETECTED NOT DETECTED   Neisseria meningitidis NOT DETECTED NOT DETECTED   Pseudomonas aeruginosa NOT DETECTED NOT DETECTED   Stenotrophomonas maltophilia NOT DETECTED NOT DETECTED   Candida albicans NOT DETECTED NOT DETECTED   Candida auris NOT DETECTED NOT DETECTED   Candida glabrata NOT DETECTED NOT DETECTED   Candida krusei NOT DETECTED NOT DETECTED   Candida parapsilosis NOT DETECTED NOT DETECTED   Candida tropicalis NOT DETECTED NOT DETECTED   Cryptococcus neoformans/gattii NOT DETECTED NOT DETECTED   CTX-M ESBL NOT DETECTED NOT DETECTED   Carbapenem resistance IMP NOT DETECTED NOT DETECTED   Carbapenem resistance KPC NOT DETECTED NOT DETECTED   Carbapenem resistance NDM NOT DETECTED NOT DETECTED   Carbapenem resist OXA 48 LIKE NOT DETECTED NOT DETECTED   Carbapenem resistance VIM NOT DETECTED NOT DETECTED    Wynona Neat, PharmD, BCPS  10/01/2020  5:57 AM

## 2020-10-01 NOTE — Transfer of Care (Signed)
Immediate Anesthesia Transfer of Care Note  Patient: Marcus [name]  Procedure(s) Performed: ESOPHAGOGASTRODUODENOSCOPY (EGD) WITH PROPOFOL HEMOSTASIS CLIP PLACEMENT  Patient Location: ICU  Anesthesia Type:General  Level of Consciousness: unresponsive and Patient remains intubated per anesthesia plan  Airway & Oxygen Therapy: Patient remains intubated per anesthesia plan and Patient placed on Ventilator (see vital sign flow sheet for setting)  Post-op Assessment: Report given to RN and Post -op Vital signs reviewed and stable  Post vital signs: Reviewed and stable  Last Vitals:  Vitals Value Taken Time  BP    Temp    Pulse 88 10/01/20 1611  Resp 16 10/01/20 1611  SpO2 98 % 10/01/20 1611  Vitals shown include unvalidated device data.  Last Pain:  Vitals:   10/01/20 1231  TempSrc: Oral  PainSc:          Complications: No notable events documented.

## 2020-10-01 NOTE — Progress Notes (Signed)
[facility] night shift PCU coverage note.  The nursing staff reported that the patient's blood pressures are soft.  His hemoglobin level was 6.6 g/dL this morning.  Dr. Marlowe Sax ordered 2 PRBC units for transfusion.  The nursing staff stated that the blood bank may take some time finding compatible blood.  LR 1000 mL IV bolus ordered.  We will continue to monitor the blood pressure closely.  Tennis Must, MD

## 2020-10-01 NOTE — Procedures (Signed)
Interventional Radiology Procedure Note  Procedure:  1) Celiac angiogram 2) Selective gastroepiploic angiogram 3) Selective left gastric angiogram 4) Selective splenic angiogram  Findings: Please refer to procedural dictation for full description. No active extravasation visualized throughout interrogated arterial territories.  No embolization performed.  6 Fr Angioseal closure of right CFA access.  Complications: None immediate  Estimated Blood Loss: < 5 mL  Recommendations: Flat for 2 hours, head of bed up to 30 degrees for 2 hours. Agree with continued resuscitation as needed. Please notify IR if hemodynamic status suddenly worsens and obtain another multiphase CTA to include venous phase if stable enough to tolerate.  Low likelihood of emergent TIPS benefiting patient given questionable history of cirrhosis and lack of any imaging findings compatible with this diagnosis.     Ruthann Cancer, MD Pager: [phone]

## 2020-10-01 NOTE — Progress Notes (Addendum)
NAME:  Marcus [name], MRN:  [id number], DOB:  [birth year], LOS: 1 ADMISSION DATE:  09/30/2020, CONSULTATION DATE: 6/26 REFERRING MD: Dr. 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  He was recently admitted to [facility] for hemoptysis leading to shortness of breath.  He underwent CT angiogram of the chest which demonstrated right upper lobe cavitary lesion increasing in size and findings consistent of a cavitary/necrotizing pneumonia.  During that admission he was initially treated with IV antibiotics and infectious disease specialist were consulted.  Respiratory culture grew MRSA and Candida dublisiensis, Fungitell was negative.  He was treated with Diflucan as well which was discontinued by ID as it was felt to be a colonizer.  He was ultimately discharged 6/24 on a 3-week course of linezolid and Flagyl.  He presented again to [facility] emergency department on 6/26 with complaints of massive hemoptysis after being found down at home with blood on the floor around him.  Upon arrival to the emergency department he had finding concerning for hemorrhagic shock with systolic blood pressure 64 and tachycardia with rates in the 120s.  He was provided with 2 units of emergency release blood.  Hemoglobin resulted at 8.0, which is down from 10.6 just 2 days prior.  Overall, he did improve after blood transfusion and crystalloid infusions.  There was some concern that the bleeding may be coming from a GI source, the patient was seen by GI without any plans for intervention.  Hemoptysis slowed down considerably in the emergency department and the patient's clinical status improved.  He was transferred to progressive care [facility] for pulmonary, CVTS, and IR  evaluation.   Pertinent  Medical History   has a past medical history of COPD (chronic obstructive pulmonary disease) ([place]), Felty syndrome ([place]), Hernia, epigastric, Pancytopenia ([place]), Seropositive rheumatoid arthritis ([place]), and Tobacco use disorder.   Significant Hospital Events: Including procedures, antibiotic start and stop dates in addition to other pertinent events   6/26 Presented to [facility] with hemoptysis, transferred to [facility]  6/27 To Endo for EGD/Colonoscopy, s/p 2 units PRBC  Interim History / Subjective:  Afebrile  Pt reports he has been feeling poorly for 5-6 months - weak, has been coughing up blood since October 2021 and has noted dark stools (unclear if this is active GI source vs aspiration / hemoptysis) RN reports 2nd unit of blood completed at 0940, repeat Hgb pending  Objective   Blood pressure (!) 101/57, pulse 83, temperature 97.8 F (36.6 C), temperature source Oral, resp. rate 19, height 5\' 9"  (1.753 m), weight 61.1 kg, SpO2 100 %.        Intake/Output Summary (Last 24 hours) at 10/01/2020 1015 Last data filed at 10/01/2020 4098 Gross per 24 hour  Intake 716.67 ml  Output 900 ml  Net -183.33 ml   Filed Weights   09/30/20 2145  Weight: 61.1 kg    Examination: General: pale adult male lying in bed in NAD  HEENT: MM pale/dry, [place] O2, anicteric, dried blood on face Neuro: AAOx4, speech clear, MAE  CV: s1s2 RRR, no m/r/g PULM:  non-labored on 3L, coarse breath sounds on right, clear on left  GI: soft, bsx4 active  Extremities: warm/dry, no edema  Skin: no rashes or lesions   Resolved Hospital Problem list  Assessment & Plan:   Hemoptysis secondary to [name] consistent with mycetoma.  Cavitary pneumonia Acute on chronic hypoxemic respiratory failure.  CT imaging does not suggest massive hemoptysis with aspiration.  Imaging comparison from 6/13 actually appears somewhat improved.  He has had some hemoptysis for many months.  More  acutely, concern for GI source bleeding.  -wean O2 for sats >90% -continue abx, appreciate ID management  -continue TXA nebs Q8 hours for now  -if massive hemoptysis, would need left mainstem intubation & consideration for embolization vs surgical management  Hemorrhagic shock  Acute blood loss anemia Shock improved. Hemoptysis is certainly present, but CT demonstrated contrast extravasation into the anus and there is a moderate to large amount of melanotic stool in the patients bed on exam. GI hemorrhage could very well be the larger contributor.  -monitor Hgb Q6  -appreciate GI  -follow CBC, INR  -transfuse for Hgb <8 in active bleeding   COPD without exacerbation -continue brovana nebulization  -hold home dulera -duoneb PRN  -prednisone (chronic) changed to stress dose steroids   Elevated anion gap acidosis: Lactic improved, BUN 88.  -follow LA, BMP  Hyperkalemia Hypocalcemia -per primary   Best Practice (right click and "Reselect all SmartList Selections" daily)   Diet/type: NPO Pain/Anxiety/Delirium protocol Not indicated VAP protocol (if indicated): Not indicated DVT prophylaxis: not indicated GI prophylaxis: PPI Glucose control:  not indicated Central venous access:  N/A Arterial line:  N/A Foley:  N/A Mobility:  bed rest  PT consulted: N/A Studies pending: echo Culture data pending:sputum and blood Last reviewed culture data:today Antibiotics:vanc and flagyl  Antibiotic de-escalation: no,  continue current rx Stop date: N/A Code Status:  full code Last date of multidisciplinary goals of care discussion: per primary ccm prognosis: stable  Disposition: admit to progressive care    Critical care time:  [place], MSN, APRN, NP-C, AGACNP-BC [facility] 10/01/2020, 10:15 AM   Please see [email or web address] for pager details.   From 7A-7P if no response, please call [phone] After hours, please call Warren Lacy  [phone]   Critical care attending attestation note:  Patient seen and examined and relevant ancillary tests reviewed.  I agree with the assessment and plan of care as outlined by Noe Gens, NP.   60 year old recent admission for R sided cavitary pneumonia. Admitted with significant anemia, hemoptysis, hematemesis. CT chest this admission compared to earlier in June largely unchanged. No evidence of aspiration of blood into other arreas of the lung as would be expected with hemoptysis to a large degree. Hemoptysis does not cause this degree of Hgb drop without the patient asphyxiating. Possible small volume hemoptysis from cavitary lesion but is not the source of Hgb drop clinically or on radiographs.    Synopsis of assessment and plan:  Cavitary lesion with small volume hemoptysis: --Continue Abx  Hgb Drop: --Agree with EGD, GI work up  [facility], MD See Amion for contact info  10/01/2020, 4:35 PM

## 2020-10-01 NOTE — Anesthesia Procedure Notes (Signed)
Procedure Name: Intubation Date/Time: 10/01/2020 1:37 PM Performed by: Reeves Dam, CRNA Pre-anesthesia Checklist: Patient identified, Patient being monitored, Timeout performed, Emergency Drugs available and Suction available Patient Re-evaluated:Patient Re-evaluated prior to induction Oxygen Delivery Method: Circle system utilized Preoxygenation: Pre-oxygenation with 100% oxygen Induction Type: IV induction and Rapid sequence Laryngoscope Size: 3 and Miller Grade View: Grade I Tube type: Oral Tube size: 7.0 mm Number of attempts: 1 Airway Equipment and Method: Stylet Placement Confirmation: ETT inserted through vocal cords under direct vision, positive ETCO2 and breath sounds checked- equal and bilateral Secured at: 21 cm Tube secured with: Tape Dental Injury: Teeth and Oropharynx as per pre-operative assessment

## 2020-10-01 NOTE — Progress Notes (Signed)
Wasted 52mLs of propofol in stericycle bin.  Witnessed by Wilmer Floor, RN.

## 2020-10-01 NOTE — Progress Notes (Addendum)
NAME:  Marcus [name], MRN:  [id number], DOB:  1960/06/15, LOS: 1 ADMISSION DATE:  09/30/2020, CONSULTATION DATE: 6/26 REFERRING MD: Dr. Marlowe Sax TRH, CHIEF COMPLAINT: Hemoptysis  History of Present Illness:  Mr. 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  He was recently admitted to [facility] for hemoptysis leading to shortness of breath.  He underwent CT angiogram of the chest which demonstrated right upper lobe cavitary lesion increasing in size and findings consistent of a cavitary/necrotizing pneumonia.  During that admission he was initially treated with IV antibiotics and infectious disease specialist were consulted.  Respiratory culture grew MRSA and Candida dublisiensis, Fungitell was negative.  He was treated with Diflucan as well which was discontinued by ID as it was felt to be a colonizer.  He was ultimately discharged 6/24 on a 3-week course of linezolid and Flagyl.  He presented again to [facility] emergency department on 6/26 with complaints of massive hemoptysis after being found down at home with blood on the floor around him.  Upon arrival to the emergency department he had finding concerning for hemorrhagic shock with systolic blood pressure 64 and tachycardia with rates in the 120s.  He was provided with 2 units of emergency release blood.  Hemoglobin resulted at 8.0, which is down from 10.6 just 2 days prior.  Overall, he did improve after blood transfusion and crystalloid infusions.  There was some concern that the bleeding may be coming from a GI source, the patient was seen by GI without any plans for intervention.  Hemoptysis slowed down considerably in the emergency department and the patient's clinical status improved.  He was transferred to progressive care [facility] for pulmonary, CVTS, and IR  evaluation.  Pertinent  Medical History   has a past medical history of COPD (chronic obstructive pulmonary disease) ([place]), Felty syndrome ([place]), Hernia, epigastric, Pancytopenia ([place]), Seropositive rheumatoid arthritis ([place]), and Tobacco use disorder.  No hx of significant ETOH use.   Significant Hospital Events: Including procedures, antibiotic start and stop dates in addition to other pertinent events   6/26 Presented to [facility] with hemoptysis, transferred to [facility]  6/27 To Endo for EGD/Colonoscopy, s/p 2 units PRBC.  EGD with concern for Grade D esophagitis, type 1 varices s/p clipping.  Intubated, dual lumen central access and aline placed.  To IR for possible embolization but no bleeding vessels seen on angiogram.  Received 2 units PRBC during case and 2 units FFP.   Interim History / Subjective:  CRNA reports pt had 2 units PRBC, 2 units FFP during case.   Hypertensive Aline, dual lumen central access and intubated during procedure On propofol infusion   Objective   Blood pressure (!) 109/57, pulse 87, temperature 97.6 F (36.4 C), temperature source Oral, resp. rate (!) 21, height 5\' 9"  (1.753 m), weight 61.1 kg, SpO2 100 %.    Vent Mode: PRVC FiO2 (%):  [40 %] 40 % Set Rate:  [16 bmp] 16 bmp Vt Set:  [560 mL] 560 mL PEEP:  [5 cmH20] 5 cmH20 Plateau Pressure:  [14 cmH20] 14 cmH20   Intake/Output Summary (Last 24 hours) at 10/01/2020 1618 Last data filed at 10/01/2020 1541 Gross per 24 hour  Intake 3013.67 ml  Output 900 ml  Net 2113.67 ml   Filed Weights   09/30/20 2145  Weight: 61.1 kg    Examination: General:  critically ill appearing adult male lying in bed on vent in NAD HEENT: MM pink/moist, ETT, anicteric, pupils 83mm  Neuro: sedate CV: s1s2 RRR, no m/r/g PULM: non-labored on vent, lungs bilaterally coarse GI: soft, bsx4 active  Extremities: warm/dry, no edema  Skin: no rashes or lesions   Resolved Hospital Problem list     Assessment & Plan:    Hemorrhagic shock  Acute Blood Loss Anemia Thrombocytopenia  Shock resolved. Hemoptysis is certainly present, but CT demonstrated contrast extravasation into the anus and there is a moderate to large amount of melanotic stool in the patients bed on exam. EGD showed variceal bleeding s/p clipping.  To IR for embolization but no focused area of bleeding.  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 On daily prednisone use for RA, no NSAIDS. EGD with esophagitis, varices on 6/27.  To IR for embolization 6/27 but no stigmata of active bleeding on angiogram.   -appreciate GI / IR assistance with patient care -monitor for further bleeding  -continue PPI BID -continue octreotide infusion  -will need pneumococcal vaccine in Aug 2022  Hemoptysis secondary to [name], possible Mycetoma.  Cavitary Pneumonia Recent MRSA / Prevotella in Lungs, Klebsiella PNA Bacteremia Acute on Chronic Hypoxemic Respiratory Failure Tobacco Abuse  CT imaging does not suggest massive hemoptysis / evidence of significant aspiration.  Imaging comparison from 6/13 actually appears somewhat improved.  He has had some hemoptysis for many months.  More acutely, concern for GI source bleeding which was confirmed on EGD.  -PRVC 8cc/kg -wean PEEP / FiO2 for sats >90% -follow up CXR & ABG now  -PAD protocol with propofol + fentanyl PRN for RASS goal of 0 to -1  -consider FOB prior to extubation  -continue abx per ID - vanco/flagyl/ceftriaxone  -fungitell pending  -follow up blood cultures from 6/26 at [facility] > note BCID positive for klebsiella  COPD without Exacerbation -continue brovana  -hold home dulera -duoneb PRN   RA -prednisone (chronic) changed to stress dose steroids    Felty Syndrome  -supportive care  AGMA Lactic improved, BUN 88.  -follow BMP  -lactic cleared, not hypotensive post procedure  AKI  Hyperkalemia Hypocalcemia Sr cr improved since admit  -Trend BMP / urinary output -Replace electrolytes as indicated -Avoid nephrotoxic agents, ensure adequate renal perfusion   Best Practice (right click and "Reselect all SmartList Selections" daily)   Diet/type: NPO Pain/Anxiety/Delirium protocol Yes and RASS goal: 0 to -1 VAP protocol (if indicated): Yes DVT prophylaxis: SCD GI prophylaxis: PPI Glucose control:  not indicated Central venous access:  Yes, and it is still needed Arterial line:  Yes, and it is still needed Foley:  N/A Mobility:  bed rest  PT consulted: N/A Studies pending: None Culture data pending:sputum and blood Last reviewed culture data:today Antibiotics:ceftriaxone, vanc, and flagyl  Antibiotic de-escalation: no,  continue current rx Stop date: N/A Code Status:  full code Last date of multidisciplinary goals of care discussion: Brother updated at bedside 6/27 post procedure in ICU on plan of care.  Full code.   ccm prognosis: Life-threating Disposition: remains critically ill, will stay in intensive care   Critical care time:  35 minutes    Noe Gens, MSN, APRN, NP-C, AGACNP-BC  Pulmonary & Critical Care 10/01/2020, 4:18 PM   Please  see [email or web address] for pager details.   From 7A-7P if no response, please call [phone] After hours, please call ELink [phone]

## 2020-10-01 NOTE — Anesthesia Procedure Notes (Signed)
Central Venous Catheter Insertion Performed by: Oleta Mouse, MD, anesthesiologist Start/End6/27/2022 2:13 PM, 10/01/2020 2:18 PM Patient location: OOR procedure area. Preanesthetic checklist: patient identified, IV checked, site marked, risks and benefits discussed, surgical consent, monitors and equipment checked, pre-op evaluation, timeout performed and anesthesia consent Position: supine Patient sedated Hand hygiene performed  and maximum sterile barriers used  Catheter size: 8 Fr Total catheter length 16. Central line was placed.Double lumen Procedure performed using ultrasound guided technique. Ultrasound Notes:anatomy identified, needle tip was noted to be adjacent to the nerve/plexus identified, no ultrasound evidence of intravascular and/or intraneural injection and image(s) printed for medical record Attempts: 1 Following insertion, dressing applied, line sutured and Biopatch. Post procedure assessment: blood return through all ports and free fluid flow  Patient tolerated the procedure well with no immediate complications.

## 2020-10-01 NOTE — Anesthesia Preprocedure Evaluation (Addendum)
Anesthesia Evaluation  Patient identified by MRN, date of birth, ID band Patient awake    Reviewed: Allergy & Precautions, NPO status , Patient's Chart, lab work & pertinent test results  History of Anesthesia Complications Negative for: history of anesthetic complications  Airway Mallampati: II  TM Distance: >3 FB Neck ROM: Full    Dental  (+) Edentulous Upper, Poor Dentition, Missing, Dental Advisory Given,    Pulmonary shortness of breath and Long-Term Oxygen Therapy, COPD,  COPD inhaler, Current Smoker and Patient abstained from smoking.,  COPD on prednisone, chronic hypoxic respiratory failure on 2 L  Cavitary lung lesion  Covid-19 Nucleic Acid Test Results Lab Results      Component                Value               Date                      SARSCOV2NAA              NEGATIVE            09/30/2020                [place]              NEGATIVE            09/17/2020                [place]              NEGATIVE            09/06/2020                [place]              NEGATIVE            08/28/2020                [place]              NEGATIVE            08/22/2020               + decreased breath sounds      Cardiovascular +CHF   Rhythm:Regular  1. Left ventricular ejection fraction, by estimation, is 55 to 60%. The  left ventricle has normal function. Left ventricular endocardial border  not optimally defined to evaluate regional wall motion. Left ventricular  diastolic parameters are consistent  with Grade I diastolic dysfunction (impaired relaxation).  2. Right ventricular systolic function is normal. The right ventricular  size is mildly enlarged. Mildly increased right ventricular wall  thickness. There is normal pulmonary artery systolic pressure.  3. The mitral valve is degenerative. Trivial mitral valve regurgitation.  No evidence of mitral stenosis.  4. The aortic valve was not well visualized.  Aortic valve regurgitation  is not visualized. No aortic stenosis is present.  5. Pulmonic valve regurgitation not well assessed.  6. The inferior vena cava is normal in size with greater than 50%  respiratory variability, suggesting right atrial pressure of 3 mmHg.    Neuro/Psych negative neurological ROS  negative psych ROS   GI/Hepatic Neg liver ROS, PUD,   Endo/Other  H/o steroids for COPD  Renal/GU Renal diseaseLab Results      Component                Value  Date                      CREATININE               0.64                10/01/2020                Musculoskeletal  (+) Arthritis ,   Abdominal   Peds  Hematology  (+) Blood dyscrasia, anemia , Lab Results      Component                Value               Date                      WBC                      4.5                 10/01/2020                HGB                      5.1 (LL)            10/01/2020                HCT                      15.6 (L)            10/01/2020                MCV                      87.6                10/01/2020                PLT                      85 (L)              10/01/2020              Anesthesia Other Findings   Reproductive/Obstetrics                            Anesthesia Physical Anesthesia Plan  ASA: 4  Anesthesia Plan: General   Post-op Pain Management:    Induction: Intravenous, Rapid sequence and Cricoid pressure planned  PONV Risk Score and Plan: 1 and Ondansetron and Dexamethasone  Airway Management Planned: Oral ETT  Additional Equipment:   Intra-op Plan:   Post-operative Plan: Possible Post-op intubation/ventilation  Informed Consent: I have reviewed the patients History and Physical, chart, labs and discussed the procedure including the risks, benefits and alternatives for the proposed anesthesia with the patient or authorized representative who has indicated his/her understanding and acceptance.      Dental advisory given  Plan Discussed with: CRNA  Anesthesia Plan Comments: (4 units of pRBCs to be ordered with two infused prior to going back for procedure given hgb 5.1. )        Anesthesia Quick Evaluation

## 2020-10-01 NOTE — Consult Note (Signed)
Chief Complaint: Patient was seen in consultation today for Mesenteric arteriogram with possible embolization at the request of Dr Silverio Decamp   Supervising Physician: Ruthann Cancer  Patient Status: [facility] - In-pt  History of Present Illness: Marcus [name] is a 60 y.o. male   COPD; Felty Syndrome Rh arthritis Coffee ground emesis Tx from [facility] to [facility]--- Large cavitary lesion RUL c/w MRSA dx at [facility] yesterday Vomiting blood starting Friday  To Endoscopy with Dr Silverio Decamp today: Endo today:  LA Grade D (one or more mucosal breaks involving at least 75% of esophageal circumference) esophagitis  with no bleeding was found 34 to 37 cm from the incisors. Large amount of red blood with large clots was found in the gastric fundus and in the gastric body. Lavage of the area with suction throught therapeutic channel was performed, resulting in clearance with fair visualization. 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 There were stigmata of recent bleeding with large adherent clot. They were small in largest diameter. To stop active bleeding, two hemostatic clips were successfully placed (MR conditional). Persistent bleeding with constant oozing noted. The examined duodenum was normal.  Discussion with Dr Serafina Royals Pt is now scheduled for Mesenteric arteriogram with possible embolization  Past Medical History:  Diagnosis Date   COPD (chronic obstructive pulmonary disease) (HCC)    Felty syndrome (HCC)    Hernia, epigastric    Pancytopenia (HCC)    Seropositive rheumatoid arthritis ([place])    Tobacco use disorder     Past Surgical History:  Procedure Laterality Date   INCISIONAL HERNIA REPAIR  01/17/2020   Procedure: HERNIA REPAIR INCISIONAL AND Silvestre Gunner;  Surgeon: Kinsinger, Arta Bruce, MD;  Location: [facility] ORS;  Service: General;;   LAPAROTOMY N/A 01/17/2020   Procedure: EXPLORATORY  LAPAROTOMY;  Surgeon: Mickeal Skinner, MD;  Location: [facility] ORS;  Service: General;  Laterality: N/A;    Allergies: Levaquin [levofloxacin in d5w] and Methotrexate  Medications: Prior to Admission medications   Medication Sig Start Date End Date Taking? Authorizing Provider  albuterol (PROVENTIL) (2.5 MG/3ML) 0.083% nebulizer solution Take 3 mLs (2.5 mg total) by nebulization every 6 (six) hours as needed for wheezing or shortness of breath. 08/30/20   Kathie Dike, MD  albuterol (VENTOLIN HFA) 108 (90 Base) MCG/ACT inhaler Inhale 2 puffs into the lungs every 6 (six) hours as needed for wheezing or shortness of breath. 08/30/20   Kathie Dike, MD  benzonatate (TESSALON) 200 MG capsule Take 1 capsule (200 mg total) by mouth 2 (two) times daily as needed for up to 14 days for cough. 09/28/20 10/12/20  Nolberto Hanlon, MD  feeding supplement (ENSURE ENLIVE / ENSURE PLUS) LIQD Take 237 mLs by mouth 2 (two) times daily between meals. 08/04/20   Manuella Ghazi, Pratik D, DO  guaiFENesin (MUCINEX) 600 MG 12 hr tablet Take 1 tablet (600 mg total) by mouth 2 (two) times daily for 14 days. 09/28/20 10/12/20  Nolberto Hanlon, MD  linezolid (ZYVOX) 600 MG tablet Take 1 tablet (600 mg total) by mouth 2 (two) times daily for 21 days. 09/28/20 10/19/20  Nolberto Hanlon, MD  metroNIDAZOLE (FLAGYL) 500 MG tablet Take 1 tablet (500 mg total) by mouth 2 (two) times daily for 21 days. 09/28/20 10/19/20  Nolberto Hanlon, MD  mometasone-formoterol (DULERA) 100-5 MCG/ACT AERO Inhale 2 puffs into the lungs 2 (two) times daily. 08/30/20   Kathie Dike, MD  Multiple Vitamin (MULTIVITAMIN  WITH MINERALS) TABS tablet Take 1 tablet by mouth daily. 09/29/20 10/29/20  Nolberto Hanlon, MD  mupirocin ointment (BACTROBAN) 2 % Place into the nose 2 (two) times daily. 09/28/20   Nolberto Hanlon, MD  nicotine (NICODERM CQ - DOSED IN MG/24 HOURS) 21 mg/24hr patch Place 1 patch (21 mg total) onto the skin daily for 28 days. 09/29/20 10/27/20  Nolberto Hanlon, MD   pantoprazole (PROTONIX) 40 MG tablet Take 1 tablet (40 mg total) by mouth daily for 10 days. Patient not taking: No sig reported 07/02/20 10/04/20  Manuella Ghazi, Pratik D, DO  predniSONE (DELTASONE) 10 MG tablet Take 1 tablet (10 mg total) by mouth 2 (two) times daily with a meal. Patient not taking: No sig reported 08/30/20   Kathie Dike, MD     Family History  Problem Relation Age of Onset   CAD Brother    COPD Brother     Social History   Socioeconomic History   Marital status: Single    Spouse name: Not on file   Number of children: Not on file   Years of education: Not on file   Highest education level: Not on file  Occupational History   Not on file  Tobacco Use   Smoking status: Every Day    Packs/day: 1.00    Pack years: 0.00    Types: Cigarettes   Smokeless tobacco: Never  Substance and Sexual Activity   Alcohol use: No    Alcohol/week: 0.0 standard drinks   Drug use: Yes    Types: Marijuana   Sexual activity: Not on file  Other Topics Concern   Not on file  Social History Narrative   Lives at home with Mother. Ambulates independently.   Social Determinants of Health   Financial Resource Strain: Not on file  Food Insecurity: Not on file  Transportation Needs: Not on file  Physical Activity: Not on file  Stress: Not on file  Social Connections: Not on file     Review of Systems: A 12 point ROS discussed and pertinent positives are indicated in the HPI above.  All other systems are negative.    Vital Signs: BP (!) 109/57   Pulse 87   Temp 98.1 F (36.7 C)   Resp (!) 21   Ht 5\' 9"  (1.753 m)   Wt 134 lb 11.2 oz (61.1 kg)   SpO2 100%   BMI 19.89 kg/m   Physical Exam Constitutional:      Comments: Intubated Straight from Endo to IR  Psychiatric:     Comments: Consent obtained for procedure with Brother Lolly Mustache via phone    Imaging: DG Chest 2 View  Result Date: 09/25/2020 CLINICAL DATA:  Cough.  Follow-up treatment. EXAM: CHEST - 2 VIEW  COMPARISON:  CT 09/17/2020.  Chest x-ray 09/17/2020. FINDINGS: Mediastinum and hilar structures appears stable. Prominent cavitary infiltrate/mass is again noted in the anterior medial aspect of the right lung. Slight improvement from prior exams may be present. Mild adjacent infiltrate also again noted. Continued follow-up exam suggested to demonstrate complete resolution. No scratched it tiny right pleural effusion cannot be excluded. No pneumothorax. Old right rib fractures again noted. Degenerative change thoracic spine. IMPRESSION: Prominent cavitary infiltrate/mass is again noted in the anteromedial aspect of the right lung. Slight improvement from prior exam may be present. Mild adjacent infiltrate also again noted. Continued follow-up exam suggested to demonstrate complete resolution. Electronically Signed   By: Marcello Moores  Register   On: 09/25/2020 11:48   DG Chest  2 View  Result Date: 09/06/2020 CLINICAL DATA:  Shortness of breath.  Cough. EXAM: CHEST - 2 VIEW COMPARISON:  Chest x-ray 08/28/2020.  CT 08/22/2020. FINDINGS: Mediastinum unremarkable. Heart size normal. Cavitary process in the right mid lung again noted. No change identified. No pleural effusion or pneumothorax. Old right rib fractures. IMPRESSION: Cavitary process in the right mid lung again noted. Cavitary process best identified by prior CT. Chest is unchanged from prior exam. Electronically Signed   By: Marcello Moores  Register   On: 09/06/2020 14:04   CT Angio Chest PE W and/or Wo Contrast  Result Date: 09/30/2020 CLINICAL DATA:  Hemoptysis, hematochezia, evaluate for GI bleed EXAM: CT ANGIOGRAPHY CHEST, ABDOMEN AND PELVIS TECHNIQUE: Non-contrast CT of the abdomen and pelvis initially obtained. Multidetector CT imaging through the chest, abdomen and pelvis was performed using the standard protocol during bolus administration of intravenous contrast. Multiplanar reconstructed images and MIPs were obtained and reviewed to evaluate the vascular  anatomy. CONTRAST:  153mL OMNIPAQUE IOHEXOL 350 MG/ML SOLN COMPARISON:  09/17/2020 FINDINGS: CTA CHEST FINDINGS Cardiovascular: Preferential opacification of the thoracic aorta. Normal contour and caliber of the thoracic aorta. No evidence of aneurysm, dissection, or other acute aortic pathology. Scattered aortic atherosclerosis. No evidence of pulmonary embolism on this non tailored examination. Normal heart size. Three-vessel coronary artery calcifications. No pericardial effusion. Mediastinum/Nodes: No enlarged mediastinal, hilar, or axillary lymph nodes. Thyroid gland, trachea, and esophagus demonstrate no significant findings. Lungs/Pleura: Mild centrilobular emphysema. Diffuse bilateral bronchial wall thickening. Redemonstrated relatively thick-walled cavitary lesion of the anterior right upper lobe containing soft tissue attenuation debris and measuring approximately 8.4 x 5.7 cm (series 4, image 48). No pleural effusion or pneumothorax. Musculoskeletal: No chest wall abnormality. No acute or significant osseous findings. Review of the MIP images confirms the above findings. 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 No evidence of rupture, dissection, or other acute aortic pathology. Standard branching pattern of the abdominal aorta, with solitary bilateral renal arteries. Moderate mixed calcific atherosclerosis, with atherosclerosis at the branch vessel origins without high-grade stenosis. Review of the MIP images confirms the above findings. NON-VASCULAR Hepatobiliary: No solid liver abnormality is seen. Small gallstones in the dependent gallbladder. Gallbladder wall thickening, or biliary dilatation. Pancreas: Unremarkable. No pancreatic ductal dilatation or surrounding inflammatory changes. Spleen: Normal in size without significant abnormality. Adrenals/Urinary Tract: Adrenal glands are unremarkable.  Kidneys are normal, without renal calculi, solid lesion, or hydronephrosis. Bladder is unremarkable. Stomach/Bowel: Stomach is within normal limits. Appendix appears normal. No evidence of bowel wall thickening, distention, or inflammatory changes. There is contrast extravasation at the anus (series 6, image 85). Lymphatic: No enlarged abdominal or pelvic lymph nodes. Reproductive: No mass or other significant abnormality. Other: No abdominal wall hernia or abnormality. No abdominopelvic ascites. Musculoskeletal: No acute or significant osseous findings. Review of the MIP images confirms the above findings. IMPRESSION: 1. Redemonstrated, thick-walled cavitary lesion of the anterior right upper lobe containing soft tissue attenuation history. 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 Cavitary malignancy is a significant differential consideration. 2. There is contrast extravasation at the anus, consistent with hemorrhoidal bleeding or other very distal etiology of GI bleed. 3. 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 No evidence of rupture, dissection, or other acute aortic pathology. Recommend follow-up ultrasound every 3 years. This recommendation follows ACR consensus guidelines: White Paper of  the ACR Incidental Findings Committee II on Vascular Findings. J Am Coll Radiol 2013; 10:789-794. 4. Emphysema. 5. Diffuse bilateral bronchial wall thickening, consistent with nonspecific infectious or inflammatory bronchitis. 6. Coronary artery disease. 7. Cholelithiasis. Aortic Atherosclerosis (ICD10-I70.0) and Emphysema (ICD10-J43.9). Electronically Signed   By: Eddie Candle M.D.   On: 09/30/2020 11:51   CT Angio Chest PE W and/or Wo Contrast  Result Date: 09/17/2020 CLINICAL DATA:  Hemoptysis, shortness of breath, abdominal distension EXAM: CT ANGIOGRAPHY CHEST CT ABDOMEN AND PELVIS WITH CONTRAST TECHNIQUE:  Multidetector CT imaging of the chest was performed using the standard protocol during bolus administration of intravenous contrast. Multiplanar CT image reconstructions and MIPs were obtained to evaluate the vascular anatomy. Multidetector CT imaging of the abdomen and pelvis was performed using the standard protocol during bolus administration of intravenous contrast. CONTRAST:  55mL OMNIPAQUE IOHEXOL 350 MG/ML SOLN COMPARISON:  CT 06/20/2020, 06/22/2020 FINDINGS: CTA CHEST FINDINGS Cardiovascular: Satisfactory opacification of the pulmonary arteries to the segmental level. No evidence of pulmonary embolism. Thoracic aorta is normal in course and caliber. Atherosclerotic calcifications of the aorta and coronary arteries. Normal heart size. No pericardial effusion. Mediastinum/Nodes: Mildly prominent mediastinal lymph nodes are again noted, likely reactive. No axillary or hilar lymphadenopathy. Lungs/Pleura: Redemonstrated cavitary mass located within the inferomedial aspect of the right upper lobe measuring approximately 9.9 x 6.6 x 7.4 cm (previously measured approximately 7.8 x 4.5 x 5.5 cm on 08/22/2020. Increased surrounding consolidation and ground-glass opacity within the adjacent right upper lobe and right middle lobe previously seen cavitary lesion within the medial aspect of the left upper lobe has resolved with small thin walled pleural cyst remaining (series 6, image 44). Scattered areas of ground-glass opacity within the bilateral lower lobes and upper lobes. Background of moderate emphysema. No pleural effusion or pneumothorax. Musculoskeletal: Remote right sixth and seventh rib fractures. No new or acute osseous findings. No chest wall abnormality. Review of the MIP images confirms the above findings. CT ABDOMEN and PELVIS FINDINGS Hepatobiliary: Cholelithiasis without evidence of pericholecystic inflammation by CT. Liver within normal limits. No biliary dilatation. Pancreas: Unremarkable. No  pancreatic ductal dilatation or surrounding inflammatory changes. Spleen: Prior splenectomy. Adrenals/Urinary Tract: Unremarkable adrenal glands. Kidneys enhance symmetrically without focal lesion, stone, or hydronephrosis. Ureters are nondilated. Urinary bladder appears unremarkable. Stomach/Bowel: Stomach is within normal limits. Appendix appears normal (series 2, image 58). Incidental note of a small bowel-small bowel intussusception within the mid left abdomen (series 2, image 38). No evidence of bowel wall thickening, distention, or inflammatory changes. Vascular/Lymphatic: Fusiform infrarenal abdominal aortic aneurysm measuring up to 3.1 cm, unchanged. Aortoiliac atherosclerosis. No abdominopelvic lymphadenopathy. Reproductive: Prostate is unremarkable. Other: No free fluid. No abdominopelvic fluid collection. No pneumoperitoneum. Fat containing midline supraumbilical hernia without evidence of complication. Musculoskeletal: No acute or significant osseous findings. Review of the MIP images confirms the above findings. IMPRESSION: 1. No evidence of acute pulmonary embolism. 2. Enlarging cavitary mass within the inferomedial aspect of the right upper lobe measuring up to 9.9 cm (previously measured approximately 7.8 x 4.5 x 5.5 cm on 08/22/2020). Increased surrounding consolidation and ground-glass opacity within the adjacent right upper lobe and right middle lobe. Findings remain most compatible with cavitary/necrotizing pneumonia. 3. Scattered areas of ground-glass opacity within the bilateral lower lobes and upper lobes, concerning for multifocal infection. 4. Interval resolution of previously seen cavitary lesion within the medial aspect of the left upper lobe with small thin walled pleural cyst remaining. 5. Cholelithiasis without evidence of pericholecystic inflammation by CT.  6. Incidental note of a small bowel-small bowel intussusception within the mid left abdomen. No evidence of bowel obstruction. 7.  Fat containing midline supraumbilical hernia without evidence of complication. 8. Stable infrarenal abdominal aortic aneurysm measuring up to 3.1 cm. Recommend follow-up ultrasound every 3 years. This recommendation follows ACR consensus guidelines: White Paper of the ACR Incidental Findings Committee II on Vascular Findings. J Am Coll Radiol 2013; 10:789-794. Aortic Atherosclerosis (ICD10-I70.0) and Emphysema (ICD10-J43.9). Electronically Signed   By: Davina Poke D.O.   On: 09/17/2020 16:38   MR BRAIN W WO CONTRAST  Result Date: 09/18/2020 CLINICAL DATA:  Brain mass or lesion. 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 Recent MRSA pneumonia. Patient reports visual symptoms and seizure-like activity a few weeks ago. EXAM: MRI HEAD WITHOUT AND WITH CONTRAST TECHNIQUE: Multiplanar, multiecho pulse sequences of the brain and surrounding structures were obtained without and with intravenous contrast. CONTRAST:  4mL GADAVIST GADOBUTROL 1 MMOL/ML IV SOLN COMPARISON:  No pertinent prior exams available for comparison. FINDINGS: Brain: Intermittently motion degraded examination, limiting evaluation. Most notably, there is moderate/severe motion degradation of the sagittal T1 weighted sequence, moderate/severe motion degradation of the axial T2/FLAIR sequence, moderate motion degradation of the axial T1 weighted postcontrast sequence and moderate motion degradation of the coronal T1 weighted postcontrast sequence. Mild generalized cerebral and cerebellar atrophy. Advanced patchy and confluent T2/FLAIR hyperintensity within the cerebral white matter, nonspecific but most often secondary to chronic small vessel ischemia. Chronic lacunar infarct within the left lentiform nucleus. Small foci of SWI signal loss within the left basal ganglia and along the margin of the posterior right lateral ventricle, likely reflecting chronic microhemorrhages. Additional  subcentimeter focus of SWI signal loss within the midline cerebellum, which may reflect an additional chronic microhemorrhage or cavernoma. There is no acute infarct. No evidence of intracranial mass. No extra-axial fluid collection. No midline shift. Within the limitations of motion degradation, no abnormal intracranial enhancement is identified. Vascular: Expected proximal arterial flow voids. Non dominant intracranial right vertebral artery. Skull and upper cervical spine: Within the limitations of motion degradation, no focal marrow lesion is identified. Sinuses/Orbits: Visualized orbits show no acute finding. 1.9 cm right maxillary sinus mucous retention cyst. Trace left maxillary sinus mucosal thickening. Other: Trace bilateral mastoid effusions. 2.8 cm ovoid lesion within the left suboccipital scalp demonstrating heterogeneous but predominantly T2 hyperintense signal, T1 hypointense signal, restricted diffusion and no convincing abnormal enhancement. This is favored to reflect a sebaceous/epidermoid cyst. IMPRESSION: Motion degraded examination, as described and limiting evaluation. No evidence of acute intracranial abnormality. Severe patchy and confluent T2/FLAIR hyperintensity within the cerebral white matter, nonspecific but most often secondary to chronic small vessel ischemia. Chronic left basal ganglia lacunar infarct. Subcentimeter focus of SWI signal loss within the midline cerebellum, which may reflect a chronic microhemorrhage or cavernoma. Mild generalized parenchymal atrophy. Incidentally noted 1.9 cm right maxillary sinus mucous retention cyst. Trace bilateral mastoid effusions. Electronically Signed   By: Kellie Simmering DO   On: 09/18/2020 15:59   MR CERVICAL SPINE W WO CONTRAST  Result Date: 09/19/2020 CLINICAL DATA:  Acute or progressive myelopathy. EXAM: MRI CERVICAL SPINE WITHOUT AND WITH CONTRAST TECHNIQUE: Multiplanar and multiecho pulse sequences of the cervical spine, to include the  craniocervical junction and cervicothoracic junction, were obtained without and with intravenous contrast. CONTRAST:  5mL GADAVIST GADOBUTROL 1 MMOL/ML IV SOLN COMPARISON:  No previous cervical imaging. FINDINGS: Alignment: Straightening of the normal cervical lordosis. 1 mm degenerative  anterolisthesis C7-T1. Vertebrae: No fracture or focal bone lesion. Cord: No primary cord lesion.  See below regarding stenosis. Posterior Fossa, vertebral arteries, paraspinal tissues: Large sebaceous cyst of the upper left neck. Disc levels: Foramen magnum is widely patent.  C1-2 and C2-3 are normal. C3-4: Endplate osteophytes and bulging of the disc. Canal stenosis with AP diameter in the midline 6 mm. Effacement of the subarachnoid space and slight indentation of the cord. Moderate bilateral foraminal narrowing. C4-5: Endplate osteophytes and protrusion of the disc more prominent towards the left. Effacement of the subarachnoid space and cord deformity, worse on the left. AP diameter of the canal 5.9 mm. Bilateral foraminal stenosis. C5-6: Spondylosis with endplate osteophytes and bulging of the disc. Canal narrowing with AP diameter in the midline 8.3 mm. No compressive effect upon the cord. Bilateral foraminal narrowing. C6-7: Mild bulging of the disc. No canal stenosis. Foraminal narrowing on the right that could affect the exiting C7 nerve. C7-T1: Facet osteoarthritis with 1 mm of anterolisthesis. No stenosis of the canal or foramina. 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 AP diameter of the canal at C3-4 6 mm. AP diameter of the spinal canal at C4-5 5.9 mm. Effacement of the subarachnoid space with some indentation of the cord. No abnormal cord T2 signal however. Foraminal stenosis that could cause neural compression on either side at C3-4, either side at C4-5, either side at C5-6 and on the right C6-7. Electronically Signed   By: Nelson Chimes M.D.   On: 09/19/2020 18:01   MR THORACIC  SPINE W WO CONTRAST  Result Date: 09/19/2020 CLINICAL DATA:  Acute or progressive myelopathy. EXAM: MRI THORACIC WITHOUT AND WITH CONTRAST TECHNIQUE: Multiplanar and multiecho pulse sequences of the thoracic spine were obtained without and with intravenous contrast. CONTRAST:  13mL GADAVIST GADOBUTROL 1 MMOL/ML IV SOLN COMPARISON:  Cervical study same day FINDINGS: Alignment:  Normal Vertebrae: No fracture or focal bone lesion. Cord: No cord compression or primary cord lesion. Spinal canal widely patent throughout the region. No evidence of arteriovenous malformation. Paraspinal and other soft tissues: Negative Disc levels: No significant disc pathology in the thoracic region. Wide patency of the canal and foramina. Ordinary mild facet osteoarthritis without edema or enhancement. No encroachment upon the neural spaces. IMPRESSION: Negative thoracic study. No cord compression or cord lesion. No significant degenerative changes. Electronically Signed   By: Nelson Chimes M.D.   On: 09/19/2020 18:03   CT ABDOMEN PELVIS W CONTRAST  Result Date: 09/17/2020 CLINICAL DATA:  Hemoptysis, shortness of breath, abdominal distension EXAM: CT ANGIOGRAPHY CHEST CT ABDOMEN AND PELVIS WITH CONTRAST TECHNIQUE: Multidetector CT imaging of the chest was performed using the standard protocol during bolus administration of intravenous contrast. Multiplanar CT image reconstructions and MIPs were obtained to evaluate the vascular anatomy. Multidetector CT imaging of the abdomen and pelvis was performed using the standard protocol during bolus administration of intravenous contrast. CONTRAST:  64mL OMNIPAQUE IOHEXOL 350 MG/ML SOLN COMPARISON:  CT 06/20/2020, 06/22/2020 FINDINGS: CTA CHEST FINDINGS Cardiovascular: Satisfactory opacification of the pulmonary arteries to the segmental level. No evidence of pulmonary embolism. Thoracic aorta is normal in course and caliber. Atherosclerotic calcifications of the aorta and coronary arteries.  Normal heart size. No pericardial effusion. Mediastinum/Nodes: Mildly prominent mediastinal lymph nodes are again noted, likely reactive. No axillary or hilar lymphadenopathy. Lungs/Pleura: Redemonstrated cavitary mass located within the inferomedial aspect of the right upper lobe measuring approximately 9.9 x 6.6 x 7.4 cm (previously measured approximately 7.8 x  4.5 x 5.5 cm on 08/22/2020. Increased surrounding consolidation and ground-glass opacity within the adjacent right upper lobe and right middle lobe previously seen cavitary lesion within the medial aspect of the left upper lobe has resolved with small thin walled pleural cyst remaining (series 6, image 44). Scattered areas of ground-glass opacity within the bilateral lower lobes and upper lobes. Background of moderate emphysema. No pleural effusion or pneumothorax. Musculoskeletal: Remote right sixth and seventh rib fractures. No new or acute osseous findings. No chest wall abnormality. Review of the MIP images confirms the above findings. CT ABDOMEN and PELVIS FINDINGS Hepatobiliary: Cholelithiasis without evidence of pericholecystic inflammation by CT. Liver within normal limits. No biliary dilatation. Pancreas: Unremarkable. No pancreatic ductal dilatation or surrounding inflammatory changes. Spleen: Prior splenectomy. Adrenals/Urinary Tract: Unremarkable adrenal glands. Kidneys enhance symmetrically without focal lesion, stone, or hydronephrosis. Ureters are nondilated. Urinary bladder appears unremarkable. Stomach/Bowel: Stomach is within normal limits. Appendix appears normal (series 2, image 58). Incidental note of a small bowel-small bowel intussusception within the mid left abdomen (series 2, image 38). No evidence of bowel wall thickening, distention, or inflammatory changes. Vascular/Lymphatic: Fusiform infrarenal abdominal aortic aneurysm measuring up to 3.1 cm, unchanged. Aortoiliac atherosclerosis. No abdominopelvic lymphadenopathy.  Reproductive: Prostate is unremarkable. Other: No free fluid. No abdominopelvic fluid collection. No pneumoperitoneum. Fat containing midline supraumbilical hernia without evidence of complication. Musculoskeletal: No acute or significant osseous findings. Review of the MIP images confirms the above findings. IMPRESSION: 1. No evidence of acute pulmonary embolism. 2. Enlarging cavitary mass within the inferomedial aspect of the right upper lobe measuring up to 9.9 cm (previously measured approximately 7.8 x 4.5 x 5.5 cm on 08/22/2020). Increased surrounding consolidation and ground-glass opacity within the adjacent right upper lobe and right middle lobe. Findings remain most compatible with cavitary/necrotizing pneumonia. 3. Scattered areas of ground-glass opacity within the bilateral lower lobes and upper lobes, concerning for multifocal infection. 4. Interval resolution of previously seen cavitary lesion within the medial aspect of the left upper lobe with small thin walled pleural cyst remaining. 5. Cholelithiasis without evidence of pericholecystic inflammation by CT. 6. Incidental note of a small bowel-small bowel intussusception within the mid left abdomen. No evidence of bowel obstruction. 7. Fat containing midline supraumbilical hernia without evidence of complication. 8. Stable infrarenal abdominal aortic aneurysm measuring up to 3.1 cm. Recommend follow-up ultrasound every 3 years. This recommendation follows ACR consensus guidelines: White Paper of the ACR Incidental Findings Committee II on Vascular Findings. J Am Coll Radiol 2013; 10:789-794. Aortic Atherosclerosis (ICD10-I70.0) and Emphysema (ICD10-J43.9). Electronically Signed   By: Davina Poke D.O.   On: 09/17/2020 16:38   DG Chest Portable 1 View  Result Date: 09/30/2020 CLINICAL DATA:  Hemoptysis, GI bleed. EXAM: PORTABLE CHEST 1 VIEW COMPARISON:  September 25, 2020. FINDINGS: EKG leads project over the chest. Persistent opacity in the RIGHT  mid chest in the area of the cavitary focus that was seen on prior CT imaging. Unchanged compared to September 25, 2020. No new areas of lobar consolidative changes. No pneumothorax.  No pleural effusion on frontal radiograph. No acute bone finding on limited assessment. IMPRESSION: 1. Persistent opacity in the RIGHT mid chest in the area of the cavitary focus that was seen on prior CT imaging. 2. Essentially no change from recent comparison. Electronically Signed   By: Zetta Bills M.D.   On: 09/30/2020 10:57   DG Chest Portable 1 View  Result Date: 09/17/2020 CLINICAL DATA:  Shortness of breath and hemoptysis EXAM: PORTABLE  CHEST 1 VIEW COMPARISON:  September 06, 2020 chest radiograph and chest CT Aug 22, 2020 FINDINGS: The previously noted cavitary mass in the right middle lobe anteriorly is again noted. There is increase in consolidation in this area compared to most recent study. Lungs elsewhere clear. Heart size and pulmonary vascular normal. No adenopathy appreciable. No bone lesions. IMPRESSION: Cavitary mass right middle lobe anteriorly with increase in surrounding consolidation compared to most recent study. The lungs elsewhere are clear. Heart size normal. No adenopathy appreciable by radiography. Electronically Signed   By: Lowella Grip III M.D.   On: 09/17/2020 13:54   CT Angio Abd/Pel W and/or Wo Contrast  Result Date: 09/30/2020 CLINICAL DATA:  Hemoptysis, hematochezia, evaluate for GI bleed EXAM: CT ANGIOGRAPHY CHEST, ABDOMEN AND PELVIS TECHNIQUE: Non-contrast CT of the abdomen and pelvis initially obtained. Multidetector CT imaging through the chest, abdomen and pelvis was performed using the standard protocol during bolus administration of intravenous contrast. Multiplanar reconstructed images and MIPs were obtained and reviewed to evaluate the vascular anatomy. CONTRAST:  129mL OMNIPAQUE IOHEXOL 350 MG/ML SOLN COMPARISON:  09/17/2020 FINDINGS: CTA CHEST FINDINGS Cardiovascular: Preferential  opacification of the thoracic aorta. Normal contour and caliber of the thoracic aorta. No evidence of aneurysm, dissection, or other acute aortic pathology. Scattered aortic atherosclerosis. No evidence of pulmonary embolism on this non tailored examination. Normal heart size. Three-vessel coronary artery calcifications. No pericardial effusion. Mediastinum/Nodes: No enlarged mediastinal, hilar, or axillary lymph nodes. Thyroid gland, trachea, and esophagus demonstrate no significant findings. Lungs/Pleura: Mild centrilobular emphysema. Diffuse bilateral bronchial wall thickening. Redemonstrated relatively thick-walled cavitary lesion of the anterior right upper lobe containing soft tissue attenuation debris and measuring approximately 8.4 x 5.7 cm (series 4, image 48). No pleural effusion or pneumothorax. Musculoskeletal: No chest wall abnormality. No acute or significant osseous findings. Review of the MIP images confirms the above findings. 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 No evidence of rupture, dissection, or other acute aortic pathology. Standard branching pattern of the abdominal aorta, with solitary bilateral renal arteries. Moderate mixed calcific atherosclerosis, with atherosclerosis at the branch vessel origins without high-grade stenosis. Review of the MIP images confirms the above findings. NON-VASCULAR Hepatobiliary: No solid liver abnormality is seen. Small gallstones in the dependent gallbladder. Gallbladder wall thickening, or biliary dilatation. Pancreas: Unremarkable. No pancreatic ductal dilatation or surrounding inflammatory changes. Spleen: Normal in size without significant abnormality. Adrenals/Urinary Tract: Adrenal glands are unremarkable. Kidneys are normal, without renal calculi, solid lesion, or hydronephrosis. Bladder is unremarkable. Stomach/Bowel: Stomach is within normal  limits. Appendix appears normal. No evidence of bowel wall thickening, distention, or inflammatory changes. There is contrast extravasation at the anus (series 6, image 85). Lymphatic: No enlarged abdominal or pelvic lymph nodes. Reproductive: No mass or other significant abnormality. Other: No abdominal wall hernia or abnormality. No abdominopelvic ascites. Musculoskeletal: No acute or significant osseous findings. Review of the MIP images confirms the above findings. IMPRESSION: 1. Redemonstrated, thick-walled cavitary lesion of the anterior right upper lobe containing soft tissue attenuation history. 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 Cavitary malignancy is a significant differential consideration. 2. There is contrast extravasation at the anus, consistent with hemorrhoidal bleeding or other very distal etiology of GI bleed. 3. 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 No evidence of rupture, dissection, or other acute aortic pathology. Recommend follow-up  ultrasound every 3 years. This recommendation follows ACR consensus guidelines: White Paper of the ACR Incidental Findings Committee II on Vascular Findings. J Am Coll Radiol 2013; 10:789-794. 4. Emphysema. 5. Diffuse bilateral bronchial wall thickening, consistent with nonspecific infectious or inflammatory bronchitis. 6. Coronary artery disease. 7. Cholelithiasis. Aortic Atherosclerosis (ICD10-I70.0) and Emphysema (ICD10-J43.9). Electronically Signed   By: Eddie Candle M.D.   On: 09/30/2020 11:51    Labs:  CBC: Recent Labs    09/30/20 0939 09/30/20 1225 09/30/20 1759 10/01/20 0143 10/01/20 0544 10/01/20 1001 10/01/20 1129  WBC 7.3 8.2  --  4.5 4.5  --   --   HGB 8.0* 8.9*   < > 6.7* 5.1* 6.3* 5.1*  HCT 25.0* 25.8*   < > 19.4* 15.6* 18.8* 15.0*  PLT 104* 77*  --  84* 85*  --   --    < > = values in this interval not displayed.     COAGS: Recent Labs    06/29/20 1524 08/03/20 1955 09/17/20 1345 09/30/20 0939  INR 1.1 1.4* 1.1 1.6*  APTT 29 38* 45* 40*    BMP: Recent Labs    11/30/19 1938 01/17/20 1454 09/24/20 0702 09/27/20 0403 09/30/20 1053 10/01/20 0544 10/01/20 1129  NA 137   < > 135 134* 136 138 143  K 3.8   < > 5.0 4.9 5.4* 3.8 3.9  CL 101   < > 94* 92* 101 112* 110  CO2 27   < > 35* 34* 18* 22  --   GLUCOSE 85   < > 123* 106* 94 153* 88  BUN 10   < > 23* 28* 88* 59* 57*  CALCIUM 8.6*   < > 8.3* 8.4* 7.1* 6.9*  --   CREATININE 0.59*   < > 0.56* 0.39* 1.55* 0.64 0.60*  GFRNONAA >60   < > >60 >60 51* >60  --   GFRAA >60  --   --   --   --   --   --    < > = values in this interval not displayed.    LIVER FUNCTION TESTS: Recent Labs    09/17/20 1345 09/18/20 0533 09/30/20 1053 10/01/20 0544  BILITOT 1.0 0.8 1.0 0.6  AST 21 15 296* 298*  ALT 12 9 179* 214*  ALKPHOS 276* 168* 155* 97  PROT 6.7 5.0* 5.0* 3.4*  ALBUMIN 2.4* 1.8* 2.0* 1.4*    TUMOR MARKERS: No results for input(s): AFPTM, CEA, CA199, CHROMGRNA in the last 8760 hours.  Assessment and Plan:  GI Bleed Urgent mesenteric arteriogram with possible embolization Risks and benefits of mesenteric arteriogram with possible embolization were discussed with the patient including, but not limited to bleeding, infection, vascular injury or contrast induced renal failure.  This interventional procedure involves the use of X-rays and because of the nature of the planned procedure, it is possible that we will have prolonged use of X-ray fluoroscopy.  Potential radiation risks to you include (but are not limited to) the following: - A slightly elevated risk for cancer  several years later in life. This risk is typically less than 0.5% percent. This risk is low in comparison to the normal incidence of human cancer, which is 33% for women and 50% for men according to the [employer]. - Radiation induced injury can  include skin redness, resembling a rash, tissue breakdown / ulcers and hair loss (which can be temporary or permanent).   The likelihood of either of these occurring depends  on the difficulty of the procedure and whether you are sensitive to radiation due to previous procedures, disease, or genetic conditions.   IF your procedure requires a prolonged use of radiation, you will be notified and given written instructions for further action.  It is your responsibility to monitor the irradiated area for the 2 weeks following the procedure and to notify your physician if you are concerned that you have suffered a radiation induced injury.    All of the brothers questions were answered, he is agreeable to proceed.  Consent signed and in chart.     Thank you for this interesting consult.  I greatly enjoyed meeting Marcus [name] and look forward to participating in their care.  A copy of this report was sent to the requesting provider on this date.  Electronically Signed: Lavonia Drafts, PA-C 10/01/2020, 2:35 PM   I spent a total of 20 Minutes    in face to face in clinical consultation, greater than 50% of which was counseling/coordinating care for mesenteric arteriogram with possible embolization

## 2020-10-01 NOTE — Progress Notes (Signed)
Patient admitted from [facility] via [facility] to [facility],alert and oriented,attached to cardiac monitoring and V/S checked.CHG wipes done,oriented to the room and staff.Admitting provider notified.Will continue to monitor the patient.

## 2020-10-01 NOTE — Op Note (Signed)
[facility] Patient Name: Marcus [name] Procedure Date : 10/01/2020 MRN: [id number] Attending MD: Mauri Pole , MD Date of Birth: [birth year] CSN: [id number] Age: 60 Admit Type: Inpatient Procedure:                Upper GI endoscopy Indications:              Active gastrointestinal bleeding Providers:                Mauri Pole, MD, Dulcy Fanny, Benetta Spar, Technician Referring MD:              Medicines:                General Anesthesia Complications:            No immediate complications. Estimated Blood Loss:     Estimated blood loss was minimal. Procedure:                Pre-Anesthesia Assessment:                           - Prior to the procedure, a History and Physical                            was performed, and patient medications and                            allergies were reviewed. The patient's tolerance of                            previous anesthesia was also reviewed. The risks                            and benefits of the procedure and the sedation                            options and risks were discussed with the patient.                            All questions were answered, and informed consent                            was obtained. Prior Anticoagulants: The patient has                            taken no previous anticoagulant or antiplatelet                            agents. ASA Grade Assessment: IV - A patient with                            severe systemic disease that is a constant threat  to life. After reviewing the risks and benefits,                            the patient was deemed in satisfactory condition to                            undergo the procedure.                           After obtaining informed consent, the endoscope was                            passed under direct vision. Throughout the                            procedure, the patient's  blood pressure, pulse, and                            oxygen saturations were monitored continuously. The                            [id number]-H190 ([id number]) Olympus gastroscope was                            introduced through the mouth, and advanced to the                            second part of duodenum. The upper GI endoscopy was                            technically difficult and complex due to excessive                            bleeding. Successful completion of the procedure                            was aided by withdrawing the scope and replacing                            with the therapeutic endoscope. The patient                            tolerated the procedure well. Scope In: Scope Out: Findings:      LA Grade D (one or more mucosal breaks involving at least 75% of       esophageal circumference) esophagitis with no bleeding was found 34 to       37 cm from the incisors.      Large amount of red blood with large clots was found in the gastric       fundus and in the gastric body. Lavage of the area with suction throught       therapeutic channel was performed, resulting in clearance with fair       visualization.      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 There       were stigmata of recent bleeding with large adherent clot. They were       small in largest diameter. To stop active bleeding, two hemostatic clips       were successfully placed (MR conditional). Persistent bleeding with       constant oozing noted.      The examined duodenum was normal. Impression:               - LA Grade D reflux and erosive esophagitis with no                            bleeding.                           - Red blood and large clots in the gastric fundus                            and in the gastric body. Cleared with lavage and                            suction.                            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 Clips (MR conditional) were placed for                            marking and hemostasis.                           - Normal examined duodenum.                           - No specimens collected. Recommendation:           - NPO today.                           - Octreotide gtt                           - PPI BID                           - Transfuse to Hgb 7 and FFP (INR 1.6)                           - Discussed with IR, Dr Serafina Royals. Patient is going to  IR for emobolization from endo suite.                           - Discussed case with ICU/PCCM                           - GI will continue to follow along Procedure Code(s):        --- Professional ---                           [phone], Esophagogastroduodenoscopy, flexible,                            transoral; with control of bleeding, any method Diagnosis Code(s):        --- Professional ---                           K21.00, Gastro-esophageal reflux disease with                            esophagitis, without bleeding                           K20.80, Other esophagitis without bleeding                           K92.2, Gastrointestinal hemorrhage, unspecified                           I86.4, Gastric varices CPT copyright 2019 American Medical Association. All rights reserved. The codes documented in this report are preliminary and upon coder review may  be revised to meet current compliance requirements. Mauri Pole, MD 10/01/2020 2:19:41 PM This report has been signed electronically. Number of Addenda: 0

## 2020-10-01 NOTE — Plan of Care (Deleted)
1 

## 2020-10-01 NOTE — Progress Notes (Signed)
Pharmacy Antibiotic Note  Marcus [name] is a 60 y.o. male admitted on 09/30/2020 with cavitary pneumonia/klebsiella bacteremia. Pharmacy has been consulted for vancomycin dosing.Was on linezolid but platelets 85 now so switching to vancomycin. Scr- 0.64.   Plan: Vancomycin 1 gm x 1 then 750 mg q 12 hours Predicted AUC 450 with Scr 0.8  Monitor renal function, levels as needed   Height: 5\' 9"  (175.3 cm) Weight: 61.1 kg (134 lb 11.2 oz) IBW/kg (Calculated) : 70.7  Temp (24hrs), Avg:97.8 F (36.6 C), Min:97.7 F (36.5 C), Max:98 F (36.7 C)  Recent Labs  Lab 09/27/20 0403 09/28/20 0518 09/30/20 0939 09/30/20 0940 09/30/20 1053 09/30/20 1225 09/30/20 1758 10/01/20 0143 10/01/20 0544  WBC 6.4 3.5* 7.3  --   --  8.2  --  4.5 4.5  CREATININE 0.39*  --   --   --  1.55*  --   --   --  0.64  LATICACIDVEN  --   --   --  9.1* 8.2*  --  2.2*  --   --     Estimated Creatinine Clearance: 85.9 mL/min (by C-G formula based on SCr of 0.64 mg/dL).    Allergies  Allergen Reactions   Levaquin [Levofloxacin In D5w] Other (See Comments)    Chest pain   Methotrexate Other (See Comments)    Chest pain     Thank you for allowing pharmacy to be a part of this patient's care.  Jimmy Footman, PharmD, BCPS, BCIDP Infectious Diseases Clinical Pharmacist Phone: [phone] 10/01/2020 10:52 AM

## 2020-10-01 NOTE — Sedation Documentation (Signed)
Right femoral sheath removed. 83fr angioseal closure device deployed.

## 2020-10-02 ENCOUNTER — Inpatient Hospital Stay (HOSPITAL_COMMUNITY): Payer: Medicaid Other

## 2020-10-02 LAB — PROTIME-INR
INR: 1.1 (ref 0.8–1.2)
Prothrombin Time: 14 seconds (ref 11.4–15.2)

## 2020-10-02 LAB — CBC
HCT: 29.9 % — ABNORMAL LOW (ref 39.0–52.0)
HCT: 29.3 % — ABNORMAL LOW (ref 39.0–52.0)
HCT: 30 % — ABNORMAL LOW (ref 39.0–52.0)
Hemoglobin: 10.4 g/dL — ABNORMAL LOW (ref 13.0–17.0)
Hemoglobin: 10.2 g/dL — ABNORMAL LOW (ref 13.0–17.0)
Hemoglobin: 10.2 g/dL — ABNORMAL LOW (ref 13.0–17.0)
MCH: 30.7 pg (ref 26.0–34.0)
MCH: 30.8 pg (ref 26.0–34.0)
MCH: 30.3 pg (ref 26.0–34.0)
MCHC: 34.8 g/dL (ref 30.0–36.0)
MCHC: 34.8 g/dL (ref 30.0–36.0)
MCHC: 34 g/dL (ref 30.0–36.0)
MCV: 88.2 fL (ref 80.0–100.0)
MCV: 88.5 fL (ref 80.0–100.0)
MCV: 89 fL (ref 80.0–100.0)
Platelets: 85 10*3/uL — ABNORMAL LOW (ref 150–400)
Platelets: 87 10*3/uL — ABNORMAL LOW (ref 150–400)
Platelets: 106 10*3/uL — ABNORMAL LOW (ref 150–400)
RBC: 3.39 MIL/uL — ABNORMAL LOW (ref 4.22–5.81)
RBC: 3.31 MIL/uL — ABNORMAL LOW (ref 4.22–5.81)
RBC: 3.37 MIL/uL — ABNORMAL LOW (ref 4.22–5.81)
RDW: 16.7 % — ABNORMAL HIGH (ref 11.5–15.5)
RDW: 16.7 % — ABNORMAL HIGH (ref 11.5–15.5)
RDW: 17.5 % — ABNORMAL HIGH (ref 11.5–15.5)
WBC: 2.4 10*3/uL — ABNORMAL LOW (ref 4.0–10.5)
WBC: 2.6 10*3/uL — ABNORMAL LOW (ref 4.0–10.5)
WBC: 2.7 10*3/uL — ABNORMAL LOW (ref 4.0–10.5)
nRBC: 5.3 % — ABNORMAL HIGH (ref 0.0–0.2)
nRBC: 6.5 % — ABNORMAL HIGH (ref 0.0–0.2)
nRBC: 8.2 % — ABNORMAL HIGH (ref 0.0–0.2)

## 2020-10-02 LAB — BPAM FFP
Blood Product Expiration Date: 202207022359
Blood Product Expiration Date: 202207022359
Blood Product Expiration Date: 202207022359
Blood Product Expiration Date: 202207022359
ISSUE DATE / TIME: 202206271416
ISSUE DATE / TIME: 202206271416
Unit Type and Rh: 6200
Unit Type and Rh: 6200
Unit Type and Rh: 6200
Unit Type and Rh: 6200

## 2020-10-02 LAB — COMPREHENSIVE METABOLIC PANEL
ALT: 211 U/L — ABNORMAL HIGH (ref 0–44)
AST: 236 U/L — ABNORMAL HIGH (ref 15–41)
Albumin: 1.9 g/dL — ABNORMAL LOW (ref 3.5–5.0)
Alkaline Phosphatase: 92 U/L (ref 38–126)
Anion gap: 10 (ref 5–15)
BUN: 51 mg/dL — ABNORMAL HIGH (ref 6–20)
CO2: 19 mmol/L — ABNORMAL LOW (ref 22–32)
Calcium: 7.4 mg/dL — ABNORMAL LOW (ref 8.9–10.3)
Chloride: 113 mmol/L — ABNORMAL HIGH (ref 98–111)
Creatinine, Ser: 0.77 mg/dL (ref 0.61–1.24)
GFR, Estimated: >60 mL/min (ref >60)
Glucose, Bld: 180 mg/dL — ABNORMAL HIGH (ref 70–99)
Potassium: 4.4 mmol/L (ref 3.5–5.1)
Sodium: 142 mmol/L (ref 135–145)
Total Bilirubin: 0.7 mg/dL (ref 0.3–1.2)
Total Protein: 4.1 g/dL — ABNORMAL LOW (ref 6.5–8.1)

## 2020-10-02 LAB — PREPARE FRESH FROZEN PLASMA
Unit division: 0
Unit division: 0
Unit division: 0
Unit division: 0

## 2020-10-02 LAB — GLUCOSE, CAPILLARY
Glucose-Capillary: 166 mg/dL — ABNORMAL HIGH (ref 70–99)
Glucose-Capillary: 167 mg/dL — ABNORMAL HIGH (ref 70–99)
Glucose-Capillary: 156 mg/dL — ABNORMAL HIGH (ref 70–99)
Glucose-Capillary: 133 mg/dL — ABNORMAL HIGH (ref 70–99)
Glucose-Capillary: 134 mg/dL — ABNORMAL HIGH (ref 70–99)
Glucose-Capillary: 120 mg/dL — ABNORMAL HIGH (ref 70–99)

## 2020-10-02 LAB — TRIGLYCERIDES: Triglycerides: 337 mg/dL — ABNORMAL HIGH (ref <150)

## 2020-10-02 IMAGING — US US HEPATIC LIVER DOPPLER
1 series · 13 of 25 positions shown · non-contrast
Comparison: [DATE]

CLINICAL DATA: 60-year-old male with upper gastrointestinal
hemorrhage. Evaluate for presence cirrhosis and signs of portal
hypertension.

EXAM:
DUPLEX ULTRASOUND OF LIVER
TECHNIQUE: Color and duplex Doppler ultrasound was performed to evaluate the
hepatic in-flow and out-flow vessels.

[Series 1: abdomen us · 13 of 146 slices shown]
[im 1/146]
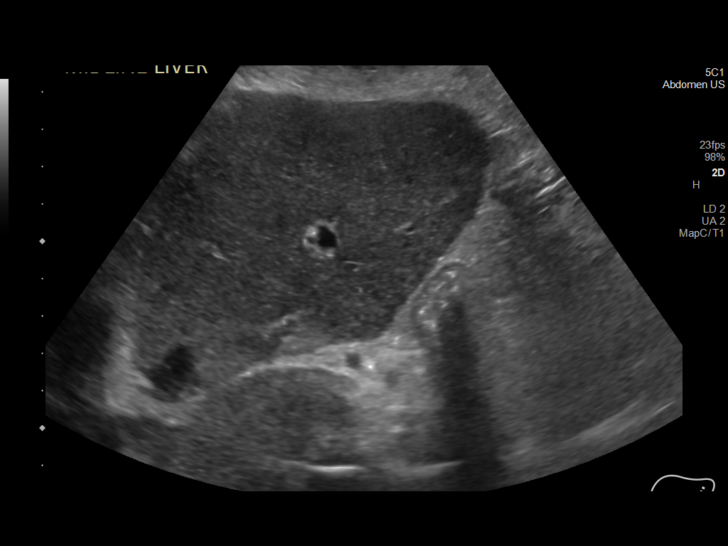
[im 13/146]
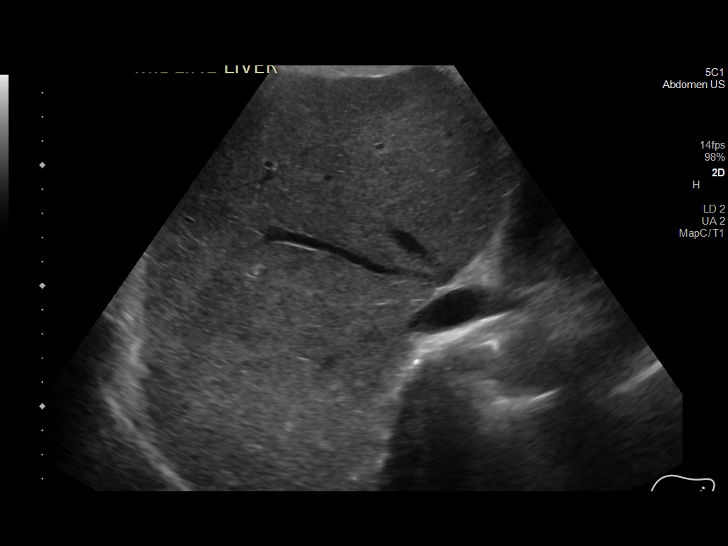
[im 25/146]
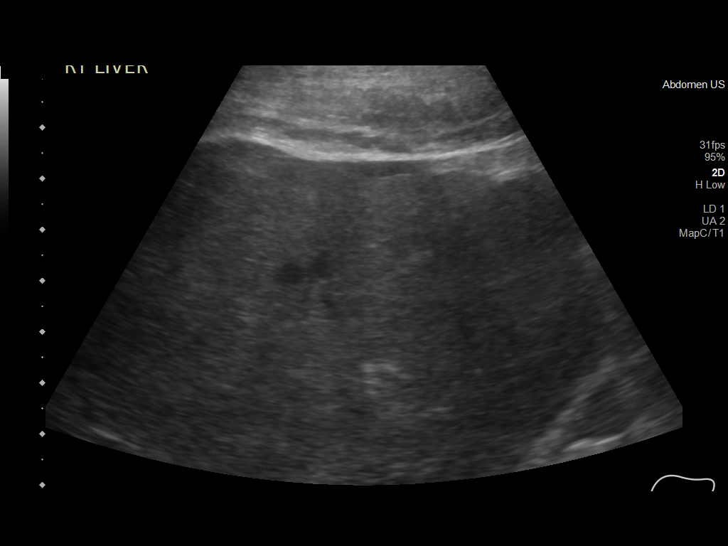
[im 37/146]
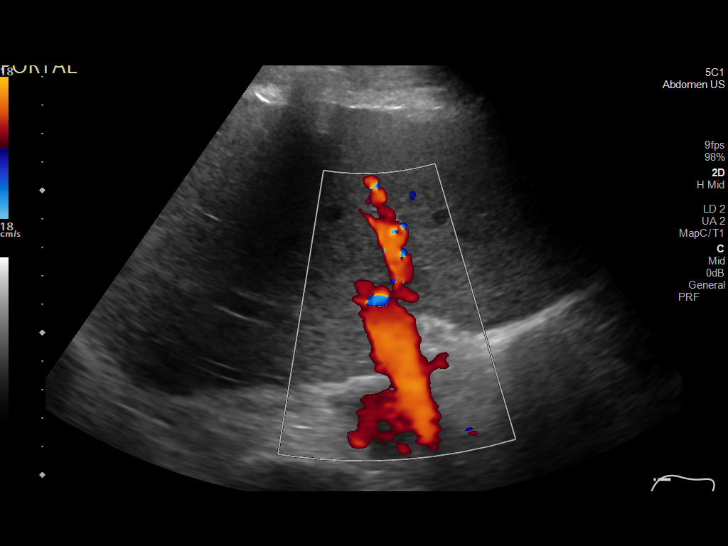
[im 49/146]
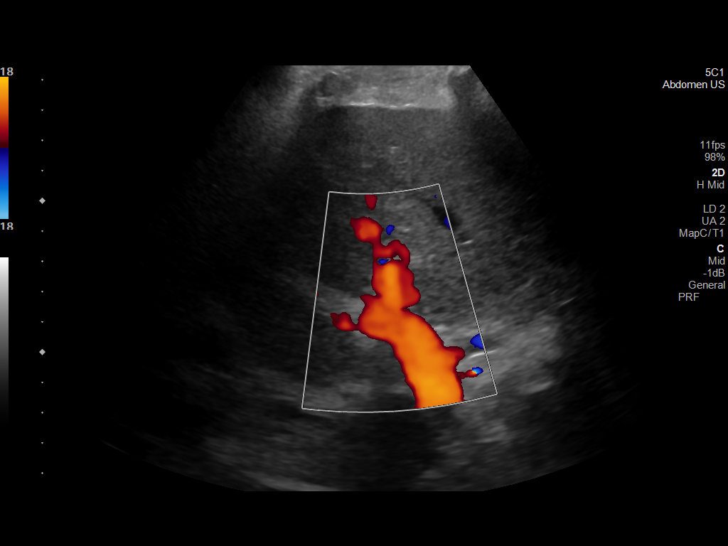
[im 61/146]
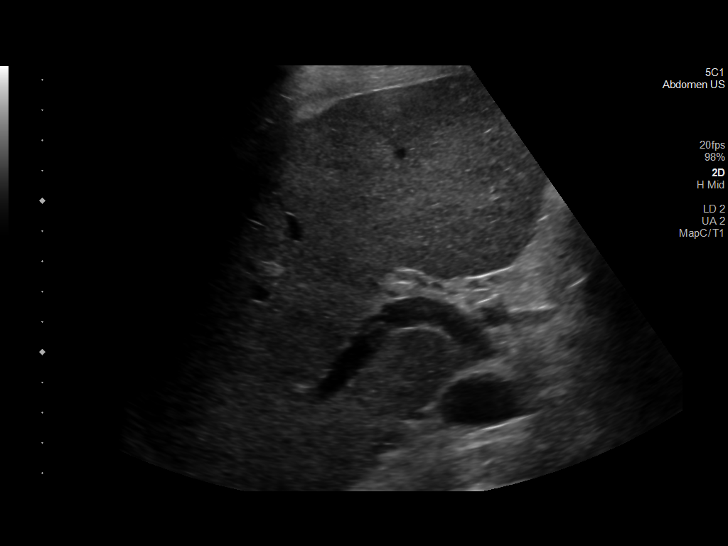
[im 73/146]
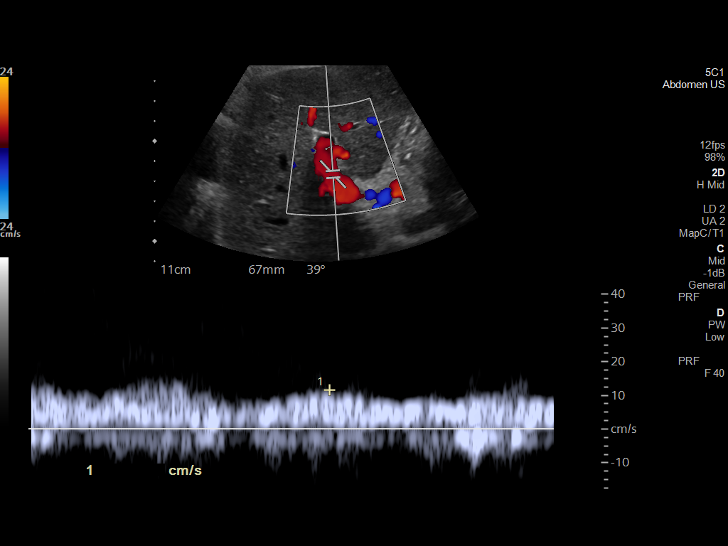
[im 85/146]
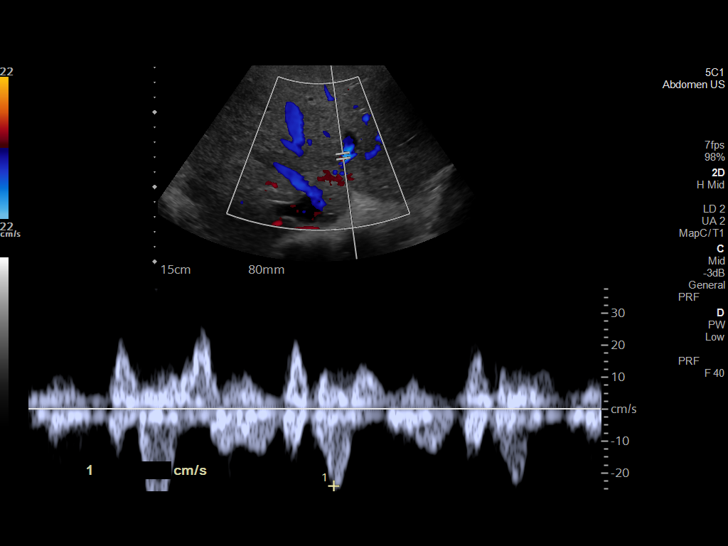
[im 97/146]
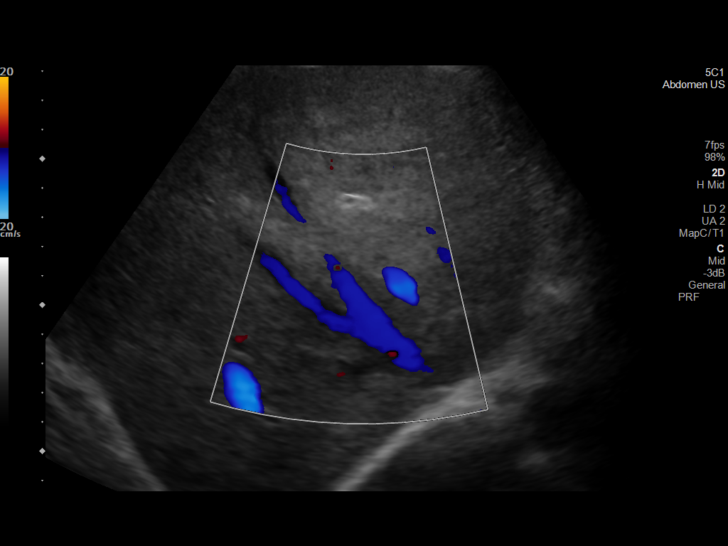
[im 109/146]
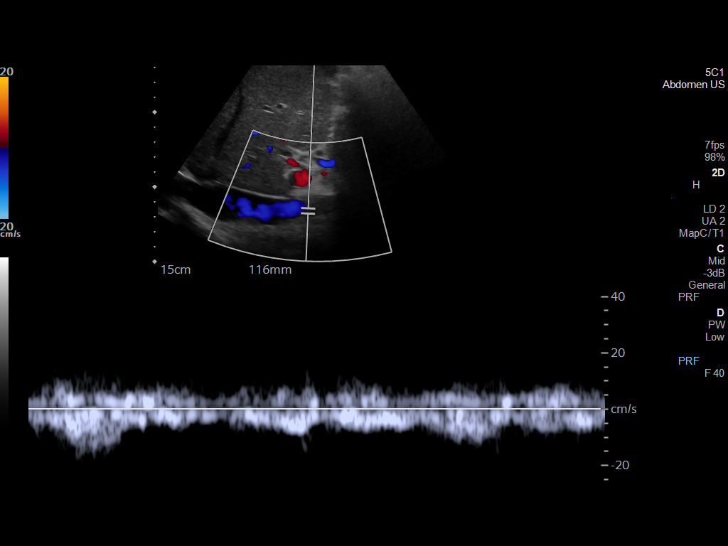
[im 121/146]
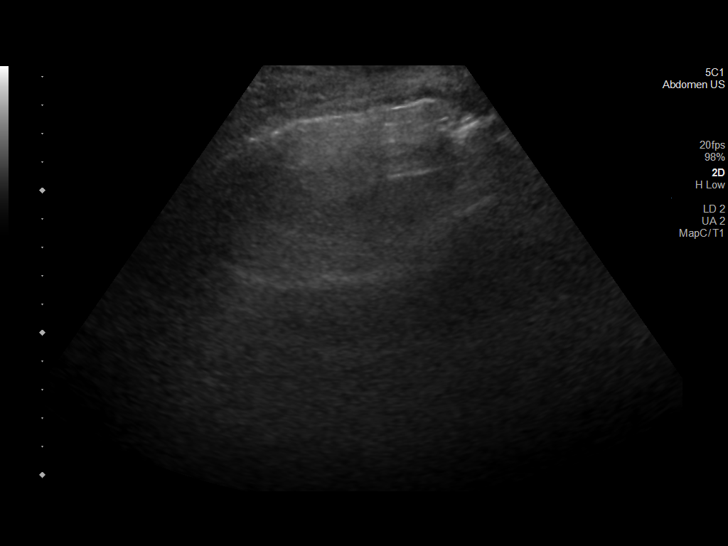
[im 133/146]
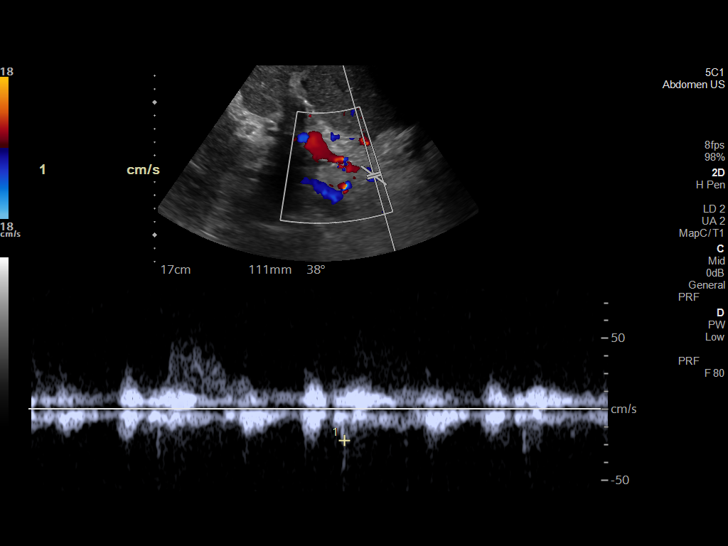
[im 146/146]
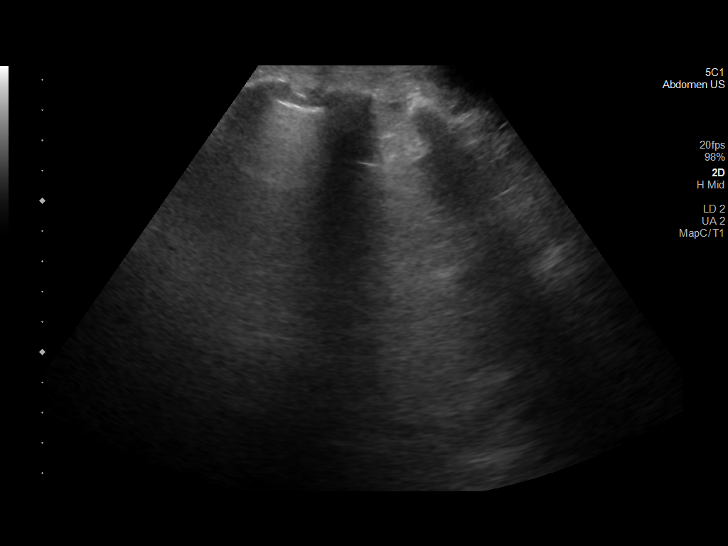

[13 of 25 positions shown; findings below may reference images not displayed]

FINDINGS: Liver: Normal parenchymal echogenicity. Micro nodularity about the
surface contour of the liver.

No focal lesion, mass or intrahepatic biliary ductal dilatation.

Main Portal Vein size: 1.0 cm

Portal Vein Velocities

Main Prox:  23 cm/sec, antegrade

Main Mid: 21 cm/sec, antegrade

Main Dist:  24 cm/sec, antegrade
Right: 20 cm/sec, antegrade
Left: 12 cm/sec, antegrade

Hepatic Vein Velocities

Right:  23 cm/sec

Middle:  31 cm/sec

Left:  24 cm/sec

IVC: Present and patent with normal respiratory phasicity.

Hepatic Artery Velocity:  44 cm/sec

Splenic Vein Velocity:  20 cm/sec

Spleen: Surgically absent.

Portal Vein Occlusion/Thrombus: No

Splenic Vein Occlusion/Thrombus: No

Ascites: Trace right perinephric fluid.

Varices: None
IMPRESSION: 1. Mild nodular hepatic contour about in otherwise normal appearing
hepatic parenchyma as could be seen with early hepatic cirrhosis.
2. Patent visualized portal system with antegrade flow.

## 2020-10-02 MED ORDER — ATROPINE SULFATE 1 MG/10ML IJ SOSY
PREFILLED_SYRINGE | INTRAMUSCULAR | Status: AC
  Filled 2020-10-02: qty 10

## 2020-10-02 MED ORDER — ETOMIDATE 2 MG/ML IV SOLN
INTRAVENOUS | Status: AC
  Administered 2020-10-02: 20 mg
  Filled 2020-10-02: qty 10

## 2020-10-02 MED ORDER — NICOTINE 21 MG/24HR TD PT24
21.0000 mg | MEDICATED_PATCH | Freq: Every day | TRANSDERMAL | Status: DC
  Administered 2020-10-02 – 2020-10-07 (×6): 21 mg via TRANSDERMAL
  Filled 2020-10-02 (×6): qty 1

## 2020-10-02 MED ORDER — SODIUM CHLORIDE 0.9 % IV BOLUS
1000.0000 mL | Freq: Once | INTRAVENOUS | Status: AC
  Administered 2020-10-02: 1000 mL via INTRAVENOUS

## 2020-10-02 MED ORDER — CHLORHEXIDINE GLUCONATE 0.12 % MT SOLN
15.0000 mL | Freq: Two times a day (BID) | OROMUCOSAL | Status: DC
  Administered 2020-10-02 – 2020-10-03 (×3): 15 mL via OROMUCOSAL
  Filled 2020-10-02: qty 15

## 2020-10-02 MED ORDER — TRANEXAMIC ACID FOR INHALATION
500.0000 mg | Freq: Three times a day (TID) | RESPIRATORY_TRACT | Status: AC
  Administered 2020-10-02 – 2020-10-04 (×5): 500 mg via RESPIRATORY_TRACT
  Filled 2020-10-02 (×6): qty 10

## 2020-10-02 MED ORDER — SODIUM CHLORIDE 0.9% FLUSH
10.0000 mL | Freq: Two times a day (BID) | INTRAVENOUS | Status: DC
Start: 2020-10-02 — End: 2020-10-04
  Administered 2020-10-02: 20 mL
  Administered 2020-10-02 – 2020-10-03 (×2): 10 mL

## 2020-10-02 MED ORDER — SODIUM CHLORIDE 0.9% FLUSH: 10.0000 mL | INTRAVENOUS | Status: DC | PRN

## 2020-10-02 MED ORDER — ORAL CARE MOUTH RINSE
15.0000 mL | Freq: Two times a day (BID) | OROMUCOSAL | Status: DC
  Administered 2020-10-03 (×2): 15 mL via OROMUCOSAL

## 2020-10-02 NOTE — Progress Notes (Signed)
[facility] Progress Note Patient Name: Marcus [name] DOB: 04-21-1960 MRN: [id number]   Date of Service  10/02/2020  HPI/Events of Note  Hypotension - BP = 88/48 with MAP = 62. Hgb = 10.4.  eICU Interventions  Plan: Bolus with 0.9 NaCl 1 liter over 1 hour now.      Intervention Category Major Interventions: Hypotension - evaluation and management  Aylani Spurlock Eugene 10/02/2020, 3:28 AM

## 2020-10-02 NOTE — Anesthesia Postprocedure Evaluation (Signed)
Anesthesia Post Note  Patient: Marcus [name]  Procedure(s) Performed: ESOPHAGOGASTRODUODENOSCOPY (EGD) WITH PROPOFOL HEMOSTASIS CLIP PLACEMENT     Patient location during evaluation: ICU Anesthesia Type: General Level of consciousness: sedated Pain management: pain level controlled Vital Signs Assessment: post-procedure vital signs reviewed and stable Respiratory status: patient remains intubated per anesthesia plan Cardiovascular status: stable Postop Assessment: no apparent nausea or vomiting Anesthetic complications: no   No notable events documented.  Last Vitals:  Vitals:   10/02/20 0753 10/02/20 0754  BP:  131/86  Pulse:  (!) 57  Resp:  (!) 24  Temp:    SpO2: 100% 100%    Last Pain:  Vitals:   10/02/20 0755  TempSrc:   PainSc: 0-No pain                 Dia Donate

## 2020-10-02 NOTE — Progress Notes (Signed)
eLink Physician-Brief Progress Note Patient Name: Marcus [name] DOB: 11/24/1960 MRN: [id number]   Date of Service  10/02/2020  HPI/Events of Note  History of tobacco abuse. Patient request for Nicotine Patch.   eICU Interventions  Plan: Nicotine Patch 21 mg to skin Q day.     Intervention Category Major Interventions: Other:  Lysle Dingwall 10/02/2020, 5:34 AM

## 2020-10-02 NOTE — Procedures (Signed)
Video Bronchoscopy Procedure Note  Date of Operation: 10/02/20  Pre-op Diagnosis: [name], possible mycetoma, cavitary PNA, [name]   Post-op Diagnosis: Same  Surgeon: Dr Erskine Emery, MD  Assistants: Harvie Heck, MD   Anesthesia: conscious sedation, moderate sedation  Meds Given: fentanyl 150 mcg, etomidate 96mg  Operation: Flexible video fiberoptic bronchoscopy, BAL  Estimated Blood Loss: 50 cc  Complications: none noted. Patient tolerated procedure well.   Indications and History: Marcus [name] 60yr old male with history of COPD, Felty syndrome with RA and pancytopenia, and tobacco use disorder recently admitted for hemoptysis with CTA Chest showing [name] concerning for cavitary/necrotizing pneumonia.  Recommendation was to perform video fiberoptic bronchoscopy with BAL. The risks, benefits, complications, treatment options and expected outcomes were discussed with the patient.  The possibilities of pneumothorax, pneumonia, reaction to medication, pulmonary aspiration, perforation of a viscus, bleeding, failure to diagnose a condition and creating a complication requiring transfusion or operation were discussed with the patient who freely signed the consent.    Samples: 4. Bronchial washings from RUL + LUL  Plans:  We will review the tracheal aspirate cultures.    SHarvie Heck MD Internal Medicine, PGY-2 10/02/20 11:23 AM Pager # [phone] Procedure performed under direct supervision of Dr. DIna Homes

## 2020-10-02 NOTE — Progress Notes (Signed)
[facility] Progress Note Patient Name: KESTER STIMPSON DOB: December 07, 1960 MRN: [id number]   Date of Service  10/02/2020  HPI/Events of Note  Hypothermia - Temp = 93.3 F and 2. Bradycardia - HR = 55. Fentanyl on hold. However, bradycardia may be d/t hypothermia. BP = 113/61.  eICU Interventions  Plan: [facility]. Follow HR as patient warms up.     Intervention Category Major Interventions: Arrhythmia - evaluation and management;Other:  Lysle Dingwall 10/02/2020, 12:26 AM

## 2020-10-02 NOTE — Progress Notes (Signed)
NAME:  Marcus [name], MRN:  [id number], DOB:  May 13, 1960, LOS: 2 ADMISSION DATE:  09/30/2020, CONSULTATION DATE: 6/26 REFERRING MD: Dr. 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  He was recently admitted to [facility] for hemoptysis leading to shortness of breath.  He underwent CT angiogram of the chest which demonstrated right upper lobe cavitary lesion increasing in size and findings consistent of a cavitary/necrotizing pneumonia.  During that admission he was initially treated with IV antibiotics and infectious disease specialist were consulted.  Respiratory culture grew MRSA and Candida dublisiensis, Fungitell was negative.  He was treated with Diflucan as well which was discontinued by ID as it was felt to be a colonizer.  He was ultimately discharged 6/24 on a 3-week course of linezolid and Flagyl.  He presented again to [facility] emergency department on 6/26 with complaints of massive hemoptysis after being found down at home with blood on the floor around him.  Upon arrival to the emergency department he had finding concerning for hemorrhagic shock with systolic blood pressure 64 and tachycardia with rates in the 120s.  He was provided with 2 units of emergency release blood.  Hemoglobin resulted at 8.0, which is down from 10.6 just 2 days prior.  Overall, he did improve after blood transfusion and crystalloid infusions.  There was some concern that the bleeding may be coming from a GI source, the patient was seen by GI without any plans for intervention.  Hemoptysis slowed down considerably in the emergency department and the patient's clinical status improved.  He was transferred to progressive care [facility] for pulmonary, CVTS, and IR  evaluation.  Pertinent  Medical History   has a past medical history of COPD (chronic obstructive pulmonary disease) ([place]), Felty syndrome ([place]), Hernia, epigastric, Pancytopenia ([place]), Seropositive rheumatoid arthritis ([place]), and Tobacco use disorder.  No hx of significant ETOH use.   Significant Hospital Events: Including procedures, antibiotic start and stop dates in addition to other pertinent events   6/26 Presented to [facility] with hemoptysis, transferred to [facility]  6/27 To Endo for EGD/Colonoscopy, s/p 2 units PRBC.  EGD with concern for Grade D esophagitis, type 1 varices s/p clipping.  Intubated, dual lumen central access and aline placed.  To IR for possible embolization but no bleeding vessels seen on angiogram.  Received 2 units PRBC during case and 2 units FFP.   Interim History / Subjective:  Patient with intermittent episodes of hypothermia and bradycardia for which put on bair hugger with improvement in bradycardia. He was also noted to be hypotensive for which given 1L NS with improvement in BP. Hemoglobin stable. Continued on octreotide gtt.   Objective   Blood pressure 131/86, pulse (!) 57, temperature 97.9 F (36.6 C), temperature source Oral, resp. rate (!) 24, height 5\' 9"  (1.753 m), weight 64.6 kg, SpO2 100 %.    Vent Mode: PSV;CPAP FiO2 (%):  [40 %] 40 % Set Rate:  [16 bmp-18 bmp] 18 bmp Vt Set:  [560 mL] 560 mL PEEP:  [5 cmH20] 5 cmH20 Pressure Support:  [5 cmH20] 5 cmH20 Plateau Pressure:  [14 cmH20] 14 cmH20   Intake/Output Summary (Last 24 hours) at 10/02/2020 0809 Last data filed at 10/02/2020 0800 Gross per 24 hour  Intake 3774.25 ml  Output 700 ml  Net 3074.25 ml   Filed Weights   09/30/20 2145 10/02/20 0115  Weight: 61.1 kg 64.6 kg    Examination: General:  chronically ill appearing, intubated on mechanical vent, NAD HEENT: MM pink/moist, ETT with blood, PERRL Neuro: Aox4, no apparent focal deficits noted  CV: s1s2 RRR, no m/r/g PULM: non-labored on  vent, lungs bilaterally coarse GI: soft, bsx4 active  Extremities: warm/dry, no edema  Skin: no rashes or lesions  Resolved Hospital Problem list   Hemorrhagic shock  AKI, hyperkalemia   Assessment & Plan:  Acute Blood Loss Anemia Thrombocytopenia  Shock resolved. Hemoptysis is certainly present, EGD showed variceal bleeding s/p clipping.  Went to IR for embolization but no focused area of bleeding. Hemoglobin stable.  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 On daily prednisone use for RA, no NSAIDS. EGD with esophagitis, gastric varices on 6/27.  To IR for embolization 6/27 but no stigmata of active bleeding on angiogram.   -appreciate GI / IR assistance with patient care -monitor for further bleeding  -continue PPI BID -continue octreotide infusion  -will need pneumococcal vaccine in Aug 2022  Hemoptysis secondary to [name], possible Mycetoma.  Cavitary Pneumonia Recent MRSA / Prevotella in Lungs, Klebsiella PNA Bacteremia Acute on Chronic Hypoxemic Respiratory Failure Tobacco Abuse  CT imaging does not suggest massive hemoptysis / evidence of significant aspiration.  Imaging comparison from 6/13 actually appears somewhat improved.  He has had some hemoptysis for many months with ongoing evidence of bleed in ETT.  -PRVC 8cc/kg, wean PEEP / FiO2 for sats >90% -PAD protocol with propofol + fentanyl PRN for RASS goal of 0 to -1  - Bronchoscopy prior to extubation  -continue abx per ID - vanco/flagyl/ceftriaxone  -fungitell pending   COPD without Exacerbation -continue brovana  -hold home dulera -duoneb PRN   RA -prednisone (chronic) changed to stress dose steroids   Felty Syndrome  - Anti-glucose-6-phosphate isomerase antibody + Immune  complexes  -supportive care  NAGMA Likely in setting of GI losses with ongoing diarrhea.  - Continue to monitor    Best Practice (right click and "Reselect all SmartList Selections" daily)   Diet/type: tubefeeds Pain/Anxiety/Delirium protocol Yes and RASS goal: 0 to -1 VAP protocol (if indicated): Yes DVT prophylaxis: SCD GI prophylaxis: PPI Glucose control:  not indicated Central venous access:  Yes, and it is still needed Arterial line:  Yes, and it is still needed Foley:  N/A Mobility:  bed rest  PT consulted: N/A Studies pending: None Culture data pending:sputum and blood Last reviewed culture data:today Antibiotics:ceftriaxone, vanc, and flagyl  Antibiotic de-escalation: no,  continue current rx Stop date: N/A Code Status:  full code Last date of multidisciplinary goals of care discussion: Brother updated at bedside 6/27 post procedure in ICU on plan of care.  Full code.   ccm prognosis: Life-threating Disposition: remains critically ill, will stay in intensive care   Critical care time:  35 minutes    Harvie Heck, MD Internal Medicine, PGY-2 10/02/20 9:03 AM Pager # [phone]     Please see [email or web address] for pager details.   From 7A-7P if no response, please call [phone] After hours, please call ELink [phone]

## 2020-10-02 NOTE — Progress Notes (Signed)
eLink Physician-Brief Progress Note Patient Name: Marcus [name] DOB: 1961/04/07 MRN: [id number]   Date of Service  10/02/2020  HPI/Events of Note  Multiple loose, watery stools. Nursing requesting Flexiseal.   eICU Interventions  Plan: Place Flexiseal.      Intervention Category Major Interventions: Other:  Clela Hagadorn Cornelia Copa 10/02/2020, 1:16 AM

## 2020-10-02 NOTE — Progress Notes (Signed)
[place] Progress Note Patient Name: STEPFON RAWLES DOB: 08-16-1960 MRN: [id number]   Date of Service  10/02/2020  HPI/Events of Note  Patient with sinus bradycardia in the 50's with occasional dip into the 40's per bedside RN, blood pressure within normal limits.  eICU Interventions  Monitor heart rate without intervention for now.        Kerry Kass Mita Vallo 10/02/2020, 11:29 PM

## 2020-10-02 NOTE — Progress Notes (Addendum)
[facility] for Infectious Disease  Date of Admission:  09/30/2020     Total days of antibiotics 13         ASSESSMENT:  [name] remains intubated for post-operative respiratory insufficiency following EGD with findings of gastric varices. Plans for bronchoscopy today with goal of extubation following procedure. Hemoglobin has remained stable overnight with no further evidence of hemoptysis. Will continue current dose of vancomycin for presumed MRSA cavitary pneumonia/lesion and ceftriaxone for Klebsiella bacteremia. Klebsiella susceptibilities remain pending. Continue supportive care per CCM and varices treatment per Gastroenterology.  PLAN:  Continue vancomycin and ceftriaxone.  Bronchoscopy today.  Await Klebsiella sensitivities. Anticipate short course of treatment.  Varices treatment per Gastroenterology Respiratory and supportive care per CCM.   ---------- I have seen the patient and reviewed and agreed with the history/physical exam finding/labs with our APP. I agreed with the assessment/plan unless otherwise noted  Patient also with esophageal varices that is newly dx'ed this admission on egd for gib. Already on ceftriaxone. Probably source of kleb pna bacteremia Pulmonary plan bronchoscopy to w/u the pulm process He remains on abx for previously presumed pna, and now kleb bsi  -management as below -anticipate 7 days abx for kleb pna to finish on 7/05; can transition to oral in 2 days if continued improvement -f/u bronchoscopy finding -abx vanc/flagyl for lung process; will decide on further tx pendign bronchoscopy finding -discussed with primary team   I spent more than 35 minute reviewing data/chart, and coordinating care and >50% direct face to face time providing counseling/discussing diagnostics/treatment plan with patient   Principal Problem:   Hemoptysis Active Problems:   Felty syndrome (HCC)   Cavitary pneumonia   GI bleed   Hemorrhagic shock  (HCC)   Acute blood loss anemia   Bleeding gastric varices   Bacteremia   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HCC)    sodium chloride   Intravenous Once   arformoterol  15 mcg Nebulization BID   chlorhexidine gluconate (MEDLINE KIT)  15 mL Mouth Rinse BID   Chlorhexidine Gluconate Cloth  6 each Topical Daily   hydrocortisone sod succinate (SOLU-CORTEF) inj  50 mg Intravenous Q8H   mouth rinse  15 mL Mouth Rinse 10 times per day   mupirocin ointment  1 application Nasal BID   nicotine  21 mg Transdermal Daily   pantoprazole (PROTONIX) IV  40 mg Intravenous Q12H   sodium chloride flush  10-40 mL Intracatheter Q12H   tranexamic acid  500 mg Nebulization Q8H    SUBJECTIVE:  Afebrile overnight with no acute events. Moved to the ICU after procedure. Remains intubated with plans for bronchoscopy. On pressure support and doing well with hand signal response and shaking head.   Allergies  Allergen Reactions   Levaquin [Levofloxacin In D5w] Other (See Comments)    Chest pain   Methotrexate Other (See Comments)    Chest pain     Review of Systems: Review of Systems  Constitutional:  Negative for chills, fever and weight loss.  Respiratory:  Negative for cough, shortness of breath and wheezing.   Cardiovascular:  Negative for chest pain and leg swelling.  Gastrointestinal:  Negative for abdominal pain, constipation, diarrhea, nausea and vomiting.  Skin:  Negative for rash.     OBJECTIVE: Vitals:   10/02/20 1000 10/02/20 1100 10/02/20 1121 10/02/20 1200  BP: (!) 143/82 134/67 130/66 134/84  Pulse: (!) 50 76 74 88  Resp:  19  15  Temp:  TempSrc:      SpO2: 100% 96% 96% 98%  Weight:      Height:       Body mass index is 21.03 kg/m.  Physical Exam Constitutional:      General: He is not in acute distress.    Appearance: He is well-developed.     Interventions: He is intubated.     Comments: Lying in bed with head of bed elevated; intubated.    Cardiovascular:     Rate and Rhythm: Normal rate and regular rhythm.     Heart sounds: Normal heart sounds.  Pulmonary:     Effort: Pulmonary effort is normal. He is intubated.     Breath sounds: Normal breath sounds.  Skin:    General: Skin is warm and dry.  Neurological:     Mental Status: He is alert and oriented to person, place, and time.  Psychiatric:        Behavior: Behavior normal.        Thought Content: Thought content normal.        Judgment: Judgment normal.    Lab Results Lab Results  Component Value Date   WBC 2.7 (L) 10/02/2020   HGB 10.2 (L) 10/02/2020   HCT 30.0 (L) 10/02/2020   MCV 89.0 10/02/2020   PLT 106 (L) 10/02/2020    Lab Results  Component Value Date   CREATININE 0.77 10/02/2020   BUN 51 (H) 10/02/2020   NA 142 10/02/2020   K 4.4 10/02/2020   CL 113 (H) 10/02/2020   CO2 19 (L) 10/02/2020    Lab Results  Component Value Date   ALT 211 (H) 10/02/2020   AST 236 (H) 10/02/2020   ALKPHOS 92 10/02/2020   BILITOT 0.7 10/02/2020     Microbiology: Recent Results (from the past 240 hour(s))  Aspergillus Ag, BAL/Serum     Status: None   Collection Time: 09/25/20 10:54 AM  Result Value Ref Range Status   Aspergillus Ag, BAL/Serum 0.04 0.00 - 0.49 Index Final    Comment: (NOTE) Performed At: [facility] [address] [place], [place] [id number] Rush Farmer MD LK:[id number]   Blood culture (routine x 2)     Status: None (Preliminary result)   Collection Time: 09/30/20  9:40 AM   Specimen: BLOOD  Result Value Ref Range Status   Specimen Description BLOOD LEFT HAND  Final   Special Requests   Final    BOTTLES DRAWN AEROBIC AND ANAEROBIC Blood Culture adequate volume   Culture   Final    NO GROWTH 2 DAYS Performed at [facility], [address] [place], [place] [address]    Report Status PENDING  Incomplete  Blood culture (routine x 2)     Status: Abnormal (Preliminary result)   Collection Time: 09/30/20  9:40 AM    Specimen: BLOOD  Result Value Ref Range Status   Specimen Description   Final    BLOOD RIGHT UPPER HAND Performed at [facility], [address], [place], [place] [address]    Special Requests   Final    BOTTLES DRAWN AEROBIC AND ANAEROBIC Blood Culture adequate volume Performed at [facility], [address] [place], [place] [address]    Culture  Setup Time   Final    GRAM NEGATIVE RODS ANAEROBIC BOTTLE ONLY CRITICAL RESULT CALLED TO, READ BACK BY AND VERIFIED WITH: JAON ROBINS @ [id number] 09/30/2020 LFD    Culture (A)  Final    KLEBSIELLA PNEUMONIAE SUSCEPTIBILITIES TO  FOLLOW Performed at [facility], [place] [address], [place], [place] [address]    Report Status PENDING  Incomplete  Blood Culture ID Panel (Reflexed)     Status: Abnormal   Collection Time: 09/30/20  9:40 AM  Result Value Ref Range Status   Enterococcus faecalis NOT DETECTED NOT DETECTED Final   Enterococcus Faecium NOT DETECTED NOT DETECTED Final   Listeria monocytogenes NOT DETECTED NOT DETECTED Final   Staphylococcus species NOT DETECTED NOT DETECTED Final   Staphylococcus aureus (BCID) NOT DETECTED NOT DETECTED Final   Staphylococcus epidermidis NOT DETECTED NOT DETECTED Final   Staphylococcus lugdunensis NOT DETECTED NOT DETECTED Final   Streptococcus species NOT DETECTED NOT DETECTED Final   Streptococcus agalactiae NOT DETECTED NOT DETECTED Final   Streptococcus pneumoniae NOT DETECTED NOT DETECTED Final   Streptococcus pyogenes NOT DETECTED NOT DETECTED Final   A.calcoaceticus-baumannii NOT DETECTED NOT DETECTED Final   Bacteroides fragilis NOT DETECTED NOT DETECTED Final   Enterobacterales DETECTED (A) NOT DETECTED Final    Comment: Enterobacterales represent a large order of gram negative bacteria, not a single organism. CRITICAL RESULT CALLED TO, READ BACK BY AND VERIFIED WITH: JASON ROBINS @ [id number] 09/30/20 LFD    Enterobacter cloacae complex NOT DETECTED NOT DETECTED  Final   Escherichia coli NOT DETECTED NOT DETECTED Final   Klebsiella aerogenes NOT DETECTED NOT DETECTED Final   Klebsiella oxytoca NOT DETECTED NOT DETECTED Final   Klebsiella pneumoniae DETECTED (A) NOT DETECTED Final    Comment: CRITICAL RESULT CALLED TO, READ BACK BY AND VERIFIED WITH: JASON ROBINS _0  09/30/2020 LFD    Proteus species NOT DETECTED NOT DETECTED Final   Salmonella species NOT DETECTED NOT DETECTED Final   Serratia marcescens NOT DETECTED NOT DETECTED Final   Haemophilus influenzae NOT DETECTED NOT DETECTED Final   Neisseria meningitidis NOT DETECTED NOT DETECTED Final   Pseudomonas aeruginosa NOT DETECTED NOT DETECTED Final   Stenotrophomonas maltophilia NOT DETECTED NOT DETECTED Final   Candida albicans NOT DETECTED NOT DETECTED Final   Candida auris NOT DETECTED NOT DETECTED Final   Candida glabrata NOT DETECTED NOT DETECTED Final   Candida krusei NOT DETECTED NOT DETECTED Final   Candida parapsilosis NOT DETECTED NOT DETECTED Final   Candida tropicalis NOT DETECTED NOT DETECTED Final   Cryptococcus neoformans/gattii NOT DETECTED NOT DETECTED Final   CTX-M ESBL NOT DETECTED NOT DETECTED Final   Carbapenem resistance IMP NOT DETECTED NOT DETECTED Final   Carbapenem resistance KPC NOT DETECTED NOT DETECTED Final   Carbapenem resistance NDM NOT DETECTED NOT DETECTED Final   Carbapenem resist OXA 48 LIKE NOT DETECTED NOT DETECTED Final   Carbapenem resistance VIM NOT DETECTED NOT DETECTED Final    Comment: Performed at [facility], [address] [place], [place] [address]  Resp Panel by RT-PCR (Flu A&B, Covid) Nasopharyngeal Swab     Status: None   Collection Time: 09/30/20 10:16 AM   Specimen: Nasopharyngeal Swab; Nasopharyngeal(NP) swabs in vial transport medium  Result Value Ref Range Status   SARS Coronavirus 2 by RT PCR NEGATIVE NEGATIVE Final    Comment: (NOTE) SARS-CoV-2 target nucleic acids are NOT DETECTED.  The SARS-CoV-2 RNA is  generally detectable in upper respiratory specimens during the acute phase of infection. The lowest concentration of SARS-CoV-2 viral copies this assay can detect is 138 copies/mL. A negative result does not preclude SARS-Cov-2 infection and should not be used as the sole basis for treatment or other patient management decisions. A negative result may  occur with  improper specimen collection/handling, submission of specimen other than nasopharyngeal swab, presence of viral mutation(s) within the areas targeted by this assay, and inadequate number of viral copies(<138 copies/mL). A negative result must be combined with clinical observations, patient history, and epidemiological information. The expected result is Negative.  Fact Sheet for Patients:  EntrepreneurPulse.com.au  Fact Sheet for Healthcare Providers:  IncredibleEmployment.be  This test is no t yet approved or cleared by the [place] FDA and  has been authorized for detection and/or diagnosis of SARS-CoV-2 by FDA under an Emergency Use Authorization (EUA). This EUA will remain  in effect (meaning this test can be used) for the duration of the COVID-19 declaration under Section 564(b)(1) of the Act, 21 U.S.C.section 360bbb-3(b)(1), unless the authorization is terminated  or revoked sooner.       Influenza A by PCR NEGATIVE NEGATIVE Final   Influenza B by PCR NEGATIVE NEGATIVE Final    Comment: (NOTE) The Xpert Xpress SARS-CoV-2/FLU/RSV plus assay is intended as an aid in the diagnosis of influenza from Nasopharyngeal swab specimens and should not be used as a sole basis for treatment. Nasal washings and aspirates are unacceptable for Xpert Xpress SARS-CoV-2/FLU/RSV testing.  Fact Sheet for Patients: EntrepreneurPulse.com.au  Fact Sheet for Healthcare Providers: IncredibleEmployment.be  This test is not yet approved or cleared by the [place] FDA and has been authorized for detection and/or diagnosis of SARS-CoV-2 by FDA under an Emergency Use Authorization (EUA). This EUA will remain in effect (meaning this test can be used) for the duration of the COVID-19 declaration under Section 564(b)(1) of the Act, 21 U.S.C. section 360bbb-3(b)(1), unless the authorization is terminated or revoked.  Performed at [facility], [address] [place], [place] [address]   MRSA Next Gen by PCR, Nasal     Status: Abnormal   Collection Time: 10/01/20  5:21 PM   Specimen: Nasal Mucosa; Nasal Swab  Result Value Ref Range Status   MRSA by PCR Next Gen DETECTED (A) NOT DETECTED Final    Comment: RESULT CALLED TO, READ BACK BY AND VERIFIED WITH: Seward Carol RN [id number] 6/57/22 A BROWNING (NOTE) The GeneXpert MRSA Assay (FDA approved for NASAL specimens only), is one component of a comprehensive MRSA colonization surveillance program. It is not intended to diagnose MRSA infection nor to guide or monitor treatment for MRSA infections. Test performance is not FDA approved in patients less than 53 years old. Performed at [facility], [place] [address], [place], [place] [address]      Terri Piedra, [facility]  10/02/2020  2:18 PM

## 2020-10-02 NOTE — Procedures (Signed)
Extubation Procedure Note  Patient Details:   Name: ABDULHADI STOPA DOB: 1961/01/07 MRN: [id number]   Airway Documentation:    Vent end date: 10/02/20 Vent end time: 1445   Evaluation  O2 sats: stable throughout Complications: No apparent complications Patient did tolerate procedure well. Bilateral Breath Sounds: Clear, Diminished   Yes   Pt extubated to 4L [place]. Positive cuff leak, no apparent stridor. Vitals stable throughout. RT will continue to monitor.  Tobi Bastos 10/02/2020, 2:48 PM

## 2020-10-02 NOTE — Progress Notes (Addendum)
Progress Note   Subjective  Chief Complaint: Melena  Status post EGD 10/01/2020 with what were thought to be gastric varices clipped, but continued to ooze, sent to IR for further investigation, but there was nothing to embolize.  Hemoglobin stable overnight 10.4--> 10.2, no further signs of GI bleeding.  This morning, patient is found in bed with trach in place, he is signaling to me that he would like to get it out because he cannot sleep with the drain.  Denies any abdominal pain.   Objective   Vital signs in last 24 hours: Temp:  [90.68 F (32.6 C)-98.1 F (36.7 C)] 97.9 F (36.6 C) (06/28 0445) Pulse Rate:  [50-91] 57 (06/28 0754) Resp:  [16-24] 24 (06/28 0754) BP: (91-132)/(48-86) 131/86 (06/28 0754) SpO2:  [93 %-100 %] 100 % (06/28 0754) Arterial Line BP: (72-206)/(41-104) 84/81 (06/28 0615) FiO2 (%):  [40 %] 40 % (06/28 0755) Weight:  [64.6 kg] 64.6 kg (06/28 0115) Last BM Date: 10/02/20 General:    white male in NAD Heart:  Regular rate and rhythm; no murmurs Lungs: Respirations even and unlabored, lungs CTA bilaterally +ET tube Abdomen:  Soft, nontender and nondistended. Normal bowel sounds. Extremities:  Without edema. Psych:  Cooperative. Normal mood and affect.  Intake/Output from previous day: 06/27 0701 - 06/28 0700 In: 3658.4 [I.V.:605.8; Blood:2693.7; IV Piggyback:359] Out: 1300 [Urine:1300] Intake/Output this shift: Total I/O In: 115.8 [I.V.:25; IV Piggyback:90.8] Out: -   Lab Results: Recent Labs    10/01/20 1613 10/01/20 1720 10/01/20 2338 10/02/20 0410  WBC 6.2  --  2.4* 2.6*  HGB 11.7* 9.9* 10.4* 10.2*  HCT 34.6* 29.0* 29.9* 29.3*  PLT 90*  --  85* 87*   BMET Recent Labs    10/01/20 0544 10/01/20 1129 10/01/20 1520 10/01/20 1613 10/01/20 1720 10/02/20 0410  NA 138 143   < > 141 143 142  K 3.8 3.9   < > 4.1 4.0 4.4  CL 112* 110  --  112*  --  113*  CO2 22  --   --  21*  --  19*  GLUCOSE 153* 88  --  151*  --  180*  BUN 59*  57*  --  52*  --  51*  CREATININE 0.64 0.60*  --  0.56*  --  0.77  CALCIUM 6.9*  --   --  7.2*  --  7.4*   < > = values in this interval not displayed.   LFT Recent Labs    10/02/20 0410  PROT 4.1*  ALBUMIN 1.9*  AST 236*  ALT 211*  ALKPHOS 92  BILITOT 0.7   PT/INR Recent Labs    09/30/20 0939  LABPROT 19.1*  INR 1.6*    Studies/Results: CT Angio Chest PE W and/or Wo Contrast  Result Date: 09/30/2020 CLINICAL DATA:  Hemoptysis, hematochezia, evaluate for GI bleed EXAM: CT ANGIOGRAPHY CHEST, ABDOMEN AND PELVIS TECHNIQUE: Non-contrast CT of the abdomen and pelvis initially obtained. Multidetector CT imaging through the chest, abdomen and pelvis was performed using the standard protocol during bolus administration of intravenous contrast. Multiplanar reconstructed images and MIPs were obtained and reviewed to evaluate the vascular anatomy. CONTRAST:  139mL OMNIPAQUE IOHEXOL 350 MG/ML SOLN COMPARISON:  09/17/2020 FINDINGS: CTA CHEST FINDINGS Cardiovascular: Preferential opacification of the thoracic aorta. Normal contour and caliber of the thoracic aorta. No evidence of aneurysm, dissection, or other acute aortic pathology. Scattered aortic atherosclerosis. No evidence of pulmonary embolism on this non tailored examination. Normal heart size.  Three-vessel coronary artery calcifications. No pericardial effusion. Mediastinum/Nodes: No enlarged mediastinal, hilar, or axillary lymph nodes. Thyroid gland, trachea, and esophagus demonstrate no significant findings. Lungs/Pleura: Mild centrilobular emphysema. Diffuse bilateral bronchial wall thickening. Redemonstrated relatively thick-walled cavitary lesion of the anterior right upper lobe containing soft tissue attenuation debris and measuring approximately 8.4 x 5.7 cm (series 4, image 48). No pleural effusion or pneumothorax. Musculoskeletal: No chest wall abnormality. No acute or significant osseous findings. Review of the MIP images confirms the  above findings. 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 No evidence of rupture, dissection, or other acute aortic pathology. Standard branching pattern of the abdominal aorta, with solitary bilateral renal arteries. Moderate mixed calcific atherosclerosis, with atherosclerosis at the branch vessel origins without high-grade stenosis. Review of the MIP images confirms the above findings. NON-VASCULAR Hepatobiliary: No solid liver abnormality is seen. Small gallstones in the dependent gallbladder. Gallbladder wall thickening, or biliary dilatation. Pancreas: Unremarkable. No pancreatic ductal dilatation or surrounding inflammatory changes. Spleen: Normal in size without significant abnormality. Adrenals/Urinary Tract: Adrenal glands are unremarkable. Kidneys are normal, without renal calculi, solid lesion, or hydronephrosis. Bladder is unremarkable. Stomach/Bowel: Stomach is within normal limits. Appendix appears normal. No evidence of bowel wall thickening, distention, or inflammatory changes. There is contrast extravasation at the anus (series 6, image 85). Lymphatic: No enlarged abdominal or pelvic lymph nodes. Reproductive: No mass or other significant abnormality. Other: No abdominal wall hernia or abnormality. No abdominopelvic ascites. Musculoskeletal: No acute or significant osseous findings. Review of the MIP images confirms the above findings. IMPRESSION: 1. Redemonstrated, thick-walled cavitary lesion of the anterior right upper lobe containing soft tissue attenuation history. 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 Cavitary malignancy is a significant differential consideration. 2. There is contrast extravasation at the anus, consistent with hemorrhoidal bleeding or other very distal etiology of GI bleed. 3. 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 No evidence of rupture, dissection, or other acute aortic pathology. Recommend follow-up ultrasound every 3 years. This recommendation follows ACR consensus guidelines: White Paper of the ACR Incidental Findings Committee II on Vascular Findings. J Am Coll Radiol 2013; 10:789-794. 4. Emphysema. 5. Diffuse bilateral bronchial wall thickening, consistent with nonspecific infectious or inflammatory bronchitis. 6. Coronary artery disease. 7. Cholelithiasis. Aortic Atherosclerosis (ICD10-I70.0) and Emphysema (ICD10-J43.9). Electronically Signed   By: Eddie Candle M.D.   On: 09/30/2020 11:51   DG CHEST PORT 1 VIEW  Result Date: 10/01/2020 CLINICAL DATA:  Intubation. EXAM: PORTABLE CHEST 1 VIEW COMPARISON:  September 30, 2020. FINDINGS: The heart size and mediastinal contours are within normal limits. Endotracheal tube appears to be in good position. Right internal jugular catheter is noted with tip in expected position of the SVC. Right upper lobe airspace opacity is noted concerning for pneumonia. The visualized skeletal structures are unremarkable. IMPRESSION: Endotracheal tube and right internal jugular catheter in grossly good position. Stable right lung opacity. Electronically Signed   By: Marijo Conception M.D.   On: 10/01/2020 16:31   DG Chest Portable 1 View  Result Date: 09/30/2020 CLINICAL DATA:  Hemoptysis, GI bleed. EXAM: PORTABLE CHEST 1 VIEW COMPARISON:  September 25, 2020. FINDINGS: EKG leads project over the chest. Persistent opacity in the RIGHT mid chest in the area of the cavitary focus that was seen on prior CT imaging. Unchanged compared to September 25, 2020. No new areas of lobar consolidative changes. No pneumothorax.  No pleural effusion on frontal radiograph. No acute bone finding on limited assessment. IMPRESSION: 1. Persistent opacity in the RIGHT mid chest in the area of the cavitary focus that was seen on prior CT imaging. 2. Essentially no change from  recent comparison. Electronically Signed   By: Zetta Bills M.D.   On: 09/30/2020 10:57   CT Angio Abd/Pel W and/or Wo Contrast  Result Date: 09/30/2020 CLINICAL DATA:  Hemoptysis, hematochezia, evaluate for GI bleed EXAM: CT ANGIOGRAPHY CHEST, ABDOMEN AND PELVIS TECHNIQUE: Non-contrast CT of the abdomen and pelvis initially obtained. Multidetector CT imaging through the chest, abdomen and pelvis was performed using the standard protocol during bolus administration of intravenous contrast. Multiplanar reconstructed images and MIPs were obtained and reviewed to evaluate the vascular anatomy. CONTRAST:  184mL OMNIPAQUE IOHEXOL 350 MG/ML SOLN COMPARISON:  09/17/2020 FINDINGS: CTA CHEST FINDINGS Cardiovascular: Preferential opacification of the thoracic aorta. Normal contour and caliber of the thoracic aorta. No evidence of aneurysm, dissection, or other acute aortic pathology. Scattered aortic atherosclerosis. No evidence of pulmonary embolism on this non tailored examination. Normal heart size. Three-vessel coronary artery calcifications. No pericardial effusion. Mediastinum/Nodes: No enlarged mediastinal, hilar, or axillary lymph nodes. Thyroid gland, trachea, and esophagus demonstrate no significant findings. Lungs/Pleura: Mild centrilobular emphysema. Diffuse bilateral bronchial wall thickening. Redemonstrated relatively thick-walled cavitary lesion of the anterior right upper lobe containing soft tissue attenuation debris and measuring approximately 8.4 x 5.7 cm (series 4, image 48). No pleural effusion or pneumothorax. Musculoskeletal: No chest wall abnormality. No acute or significant osseous findings. Review of the MIP images confirms the above findings. 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 No evidence of rupture, dissection, or other acute aortic pathology. Standard branching pattern of the abdominal  aorta, with solitary bilateral renal arteries. Moderate mixed calcific atherosclerosis, with atherosclerosis at the branch vessel origins without high-grade stenosis. Review of the MIP images confirms the above findings. NON-VASCULAR Hepatobiliary: No solid liver abnormality is seen. Small gallstones in the dependent gallbladder. Gallbladder wall thickening, or biliary dilatation. Pancreas: Unremarkable. No pancreatic ductal dilatation or surrounding inflammatory changes. Spleen: Normal in size without significant abnormality. Adrenals/Urinary Tract: Adrenal glands are unremarkable. Kidneys are normal, without renal calculi, solid lesion, or hydronephrosis. Bladder is unremarkable. Stomach/Bowel: Stomach is within normal limits. Appendix appears normal. No evidence of bowel wall thickening, distention, or inflammatory changes. There is contrast extravasation at the anus (series 6, image 85). Lymphatic: No enlarged abdominal or pelvic lymph nodes. Reproductive: No mass or other significant abnormality. Other: No abdominal wall hernia or abnormality. No abdominopelvic ascites. Musculoskeletal: No acute or significant osseous findings. Review of the MIP images confirms the above findings. IMPRESSION: 1. Redemonstrated, thick-walled cavitary lesion of the anterior right upper lobe containing soft tissue attenuation history. 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 Cavitary malignancy is a significant differential consideration. 2. There is contrast extravasation at the anus, consistent with hemorrhoidal bleeding or other very distal etiology of GI bleed. 3. 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 No evidence of rupture, dissection, or other acute aortic pathology. Recommend follow-up ultrasound every 3 years. This recommendation follows ACR consensus guidelines: White Paper of the ACR Incidental Findings Committee II  on Vascular Findings. J Am Coll Radiol 2013; 10:789-794. 4. Emphysema. 5. Diffuse bilateral bronchial wall  thickening, consistent with nonspecific infectious or inflammatory bronchitis. 6. Coronary artery disease. 7. Cholelithiasis. Aortic Atherosclerosis (ICD10-I70.0) and Emphysema (ICD10-J43.9). Electronically Signed   By: Eddie Candle M.D.   On: 09/30/2020 11:51       Assessment / Plan:   Assessment: 1.  Hemoptysis: Patient was previously coughing up blood, question whether this could have come from his stomach as well given recent findings during EGD 2.  Acute GI bleed: Hemoglobin stable overnight after EGD with clipping 3.  Hemorrhagic shock 4.  Acute blood loss anemia 5.  Cavitary pneumonia 6.  Elevated LFTs 7.  COPD: On chronic oxygen 2 to 3 L at home  Plan: 1.  EGD yesterday with clipping of what look like gastric varices.  Patient was taken to IR but there was nothing to embolize 1 nare.  Seems to have slowed overnight. 2.  Continue to monitor hemoglobin with transfusion as needed 3.  Continue other supportive measures 4.  Continue octreotide for total 72 hours 5.  Please await any further recommendations from Dr. Silverio Decamp  Thank you for your kind consultation.    LOS: 2 days   Levin Erp  10/02/2020, 9:50 AM   Attending physician's note   I have taken an interval history, reviewed the chart and examined the patient. I agree with the Advanced Practitioner's note, impression and recommendations.   Large-volume GI hemorrhage.  S/p EGD yesterday suspicious for hemorrhage from isolated gastric varices, hemostatic clipsX2 were placed given persistent oozing patient was sent to IR for possible embolization There was no active extravasation and he did not have significant gastrorenal shunt to undergo B RTO  Liver Doppler ultrasound showed micro nodularity on surface consistent with early cirrhosis He does have thrombocytopenia, low albumin and elevated INR to support  the diagnosis of cirrhosis Unclear the underlying etiology for liver disease ?  Autoimmune/NASH Meld sodium score 7 Child Pugh class B  No further bleeding, hemoglobin stable He is at risk for recurrent bleeding from gastric varices, consider early TIPS Discussed briefly with patient and his brother, will request IR consult to evaluate for possible TIPS  Plan to extubate by PCCM, is undergoing weaning trials  Continue octreotide gtt. for 72 hours PPI twice daily Ceftriaxone for SBP prophylaxis X5 days Monitor hemoglobin twice daily and avoid transfusion if above 7 Supportive care per ICU  GI will continue to follow along    I have spent 35 minutes of patient care (this includes precharting, chart review, review of results, face-to-face time used for counseling as well as treatment plan and follow-up. The patient was provided an opportunity to ask questions and all were answered. The patient agreed with the plan and demonstrated an understanding of the instructions.  Damaris Hippo , MD [phone]

## 2020-10-03 ENCOUNTER — Encounter (HOSPITAL_COMMUNITY): Payer: Self-pay | Admitting: Internal Medicine

## 2020-10-03 ENCOUNTER — Inpatient Hospital Stay (HOSPITAL_COMMUNITY): Payer: Medicaid Other

## 2020-10-03 DIAGNOSIS — K7460 Unspecified cirrhosis of liver: Secondary | ICD-10-CM

## 2020-10-03 DIAGNOSIS — D62 Acute posthemorrhagic anemia: Secondary | ICD-10-CM

## 2020-10-03 DIAGNOSIS — K922 Gastrointestinal hemorrhage, unspecified: Secondary | ICD-10-CM

## 2020-10-03 LAB — GLUCOSE, CAPILLARY
Glucose-Capillary: 118 mg/dL — ABNORMAL HIGH (ref 70–99)
Glucose-Capillary: 120 mg/dL — ABNORMAL HIGH (ref 70–99)
Glucose-Capillary: 210 mg/dL — ABNORMAL HIGH (ref 70–99)
Glucose-Capillary: 136 mg/dL — ABNORMAL HIGH (ref 70–99)
Glucose-Capillary: 205 mg/dL — ABNORMAL HIGH (ref 70–99)
Glucose-Capillary: 134 mg/dL — ABNORMAL HIGH (ref 70–99)

## 2020-10-03 LAB — CULTURE, BLOOD (ROUTINE X 2): Special Requests: ADEQUATE

## 2020-10-03 LAB — CBC
HCT: 26.1 % — ABNORMAL LOW (ref 39.0–52.0)
Hemoglobin: 9 g/dL — ABNORMAL LOW (ref 13.0–17.0)
MCH: 31.1 pg (ref 26.0–34.0)
MCHC: 34.5 g/dL (ref 30.0–36.0)
MCV: 90.3 fL (ref 80.0–100.0)
Platelets: 109 10*3/uL — ABNORMAL LOW (ref 150–400)
RBC: 2.89 MIL/uL — ABNORMAL LOW (ref 4.22–5.81)
RDW: 17.9 % — ABNORMAL HIGH (ref 11.5–15.5)
WBC: 2.1 10*3/uL — ABNORMAL LOW (ref 4.0–10.5)
nRBC: 19.8 % — ABNORMAL HIGH (ref 0.0–0.2)

## 2020-10-03 LAB — COMPREHENSIVE METABOLIC PANEL
ALT: 164 U/L — ABNORMAL HIGH (ref 0–44)
AST: 113 U/L — ABNORMAL HIGH (ref 15–41)
Albumin: 1.9 g/dL — ABNORMAL LOW (ref 3.5–5.0)
Alkaline Phosphatase: 103 U/L (ref 38–126)
Anion gap: 5 (ref 5–15)
BUN: 36 mg/dL — ABNORMAL HIGH (ref 6–20)
CO2: 25 mmol/L (ref 22–32)
Calcium: 7.8 mg/dL — ABNORMAL LOW (ref 8.9–10.3)
Chloride: 116 mmol/L — ABNORMAL HIGH (ref 98–111)
Creatinine, Ser: 0.51 mg/dL — ABNORMAL LOW (ref 0.61–1.24)
GFR, Estimated: >60 mL/min (ref >60)
Glucose, Bld: 125 mg/dL — ABNORMAL HIGH (ref 70–99)
Potassium: 3.7 mmol/L (ref 3.5–5.1)
Sodium: 146 mmol/L — ABNORMAL HIGH (ref 135–145)
Total Bilirubin: 1.2 mg/dL (ref 0.3–1.2)
Total Protein: 4.2 g/dL — ABNORMAL LOW (ref 6.5–8.1)

## 2020-10-03 IMAGING — CT CT CTA ABD/PEL W/CM AND/OR W/O CM
2 of 9 series · 13 of 46 positions shown, 15 images · IV contrast (omnipaque)
Comparison: [DATE]

CLINICAL DATA: 60-year-old male with GI bleeding, previous negative
mesenteric angiogram with continued melena

EXAM:
CTA ABDOMEN AND PELVIS WITHOUT AND WITH CONTRAST
TECHNIQUE: Multidetector CT imaging of the abdomen and pelvis was performed
using the standard protocol during bolus administration of
intravenous contrast. Multiplanar reconstructed images and MIPs were
obtained and reviewed to evaluate the vascular anatomy.
CONTRAST:  100mL OMNIPAQUE IOHEXOL 350 MG/ML SOLN

[Series 6: arterial 2.0 cor · coronal · arterial · 0.79mm/px · 3 of 127 slices shown]
[im 32/127  soft-tissue]
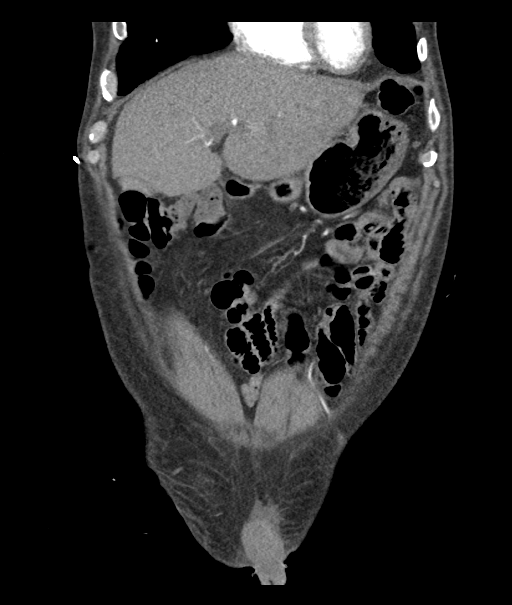
[im 64/127  soft-tissue]
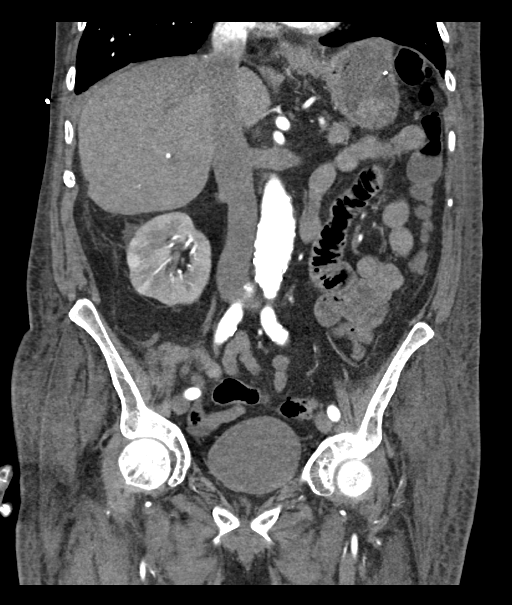
[im 95/127  soft-tissue]
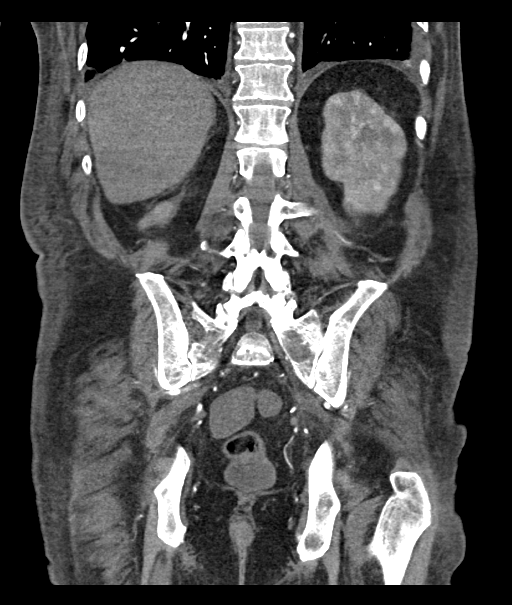

[Series 10: portal venous thins · axial · portal-venous · 0.83mm/px · z∈[+851,+1211]mm · 10 of 221 slices shown, 12 images]
[im 21/221  soft-tissue]
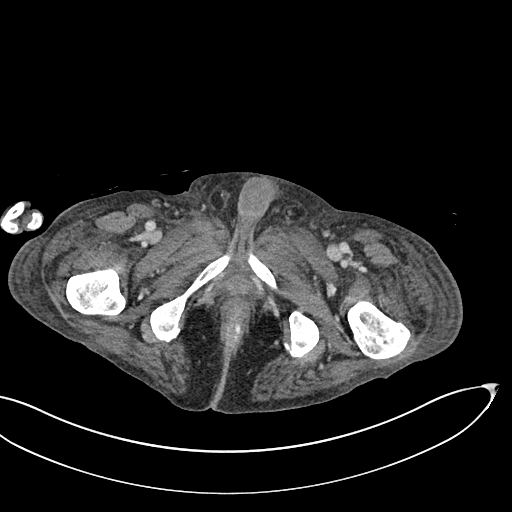
[im 21/221  bone]
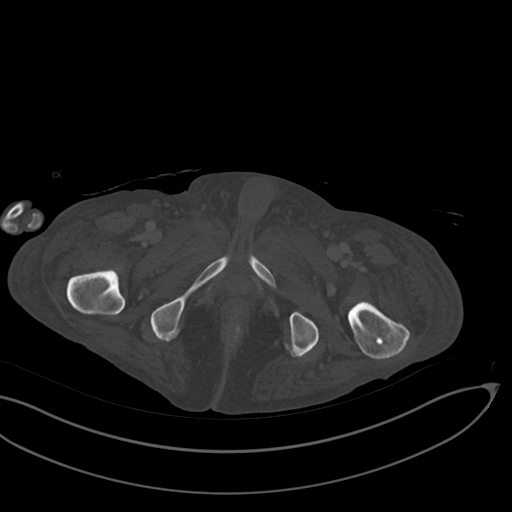
[im 41/221  soft-tissue]
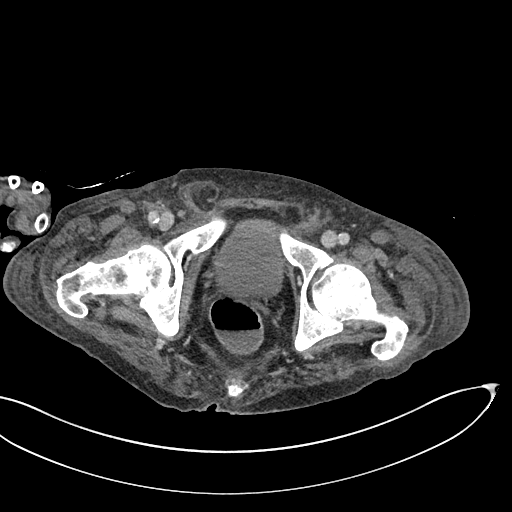
[im 61/221  soft-tissue]
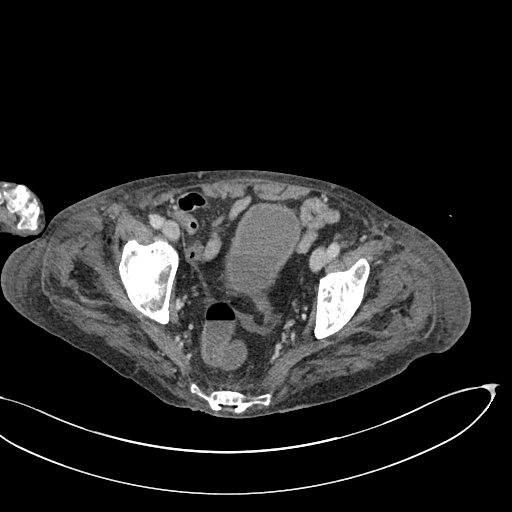
[im 81/221  soft-tissue]
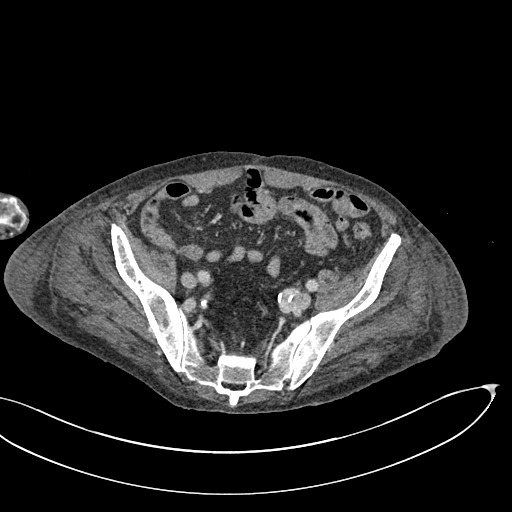
[im 101/221  soft-tissue]
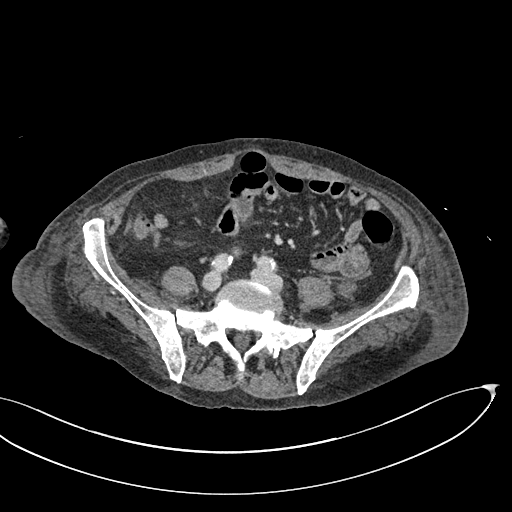
[im 121/221  soft-tissue]
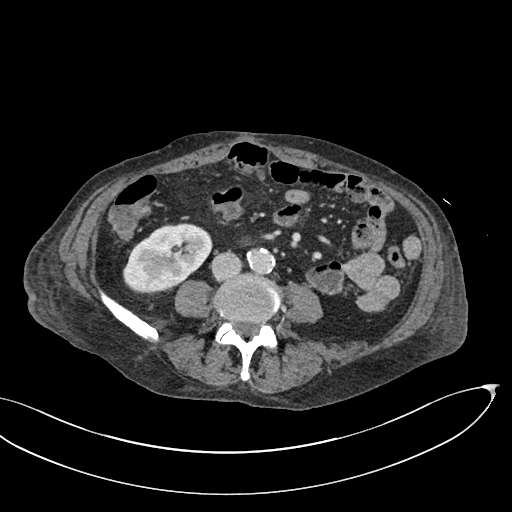
[im 141/221  soft-tissue]
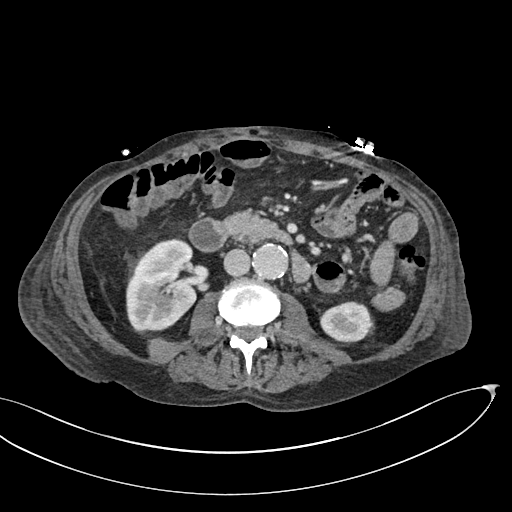
[im 161/221  soft-tissue]
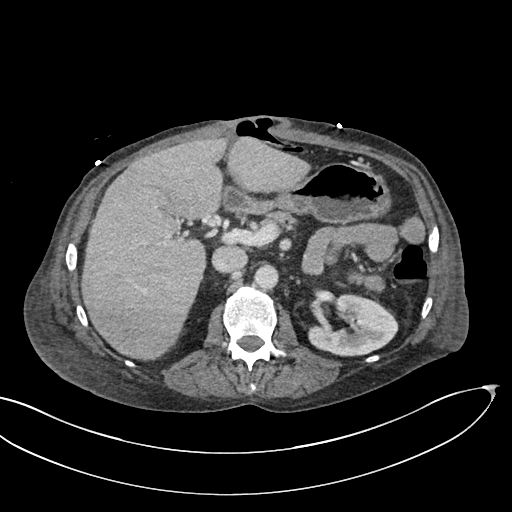
[im 181/221  soft-tissue]
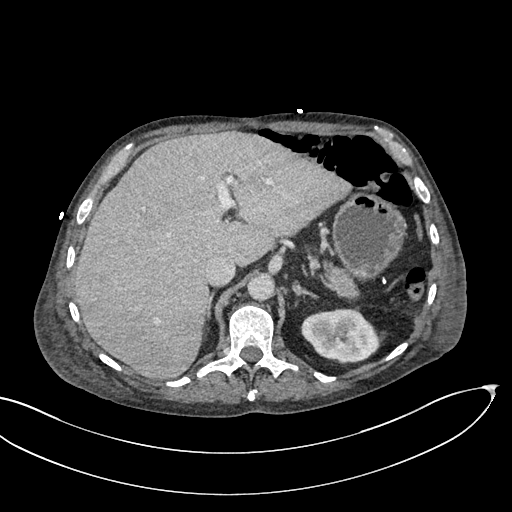
[im 181/221  bone]
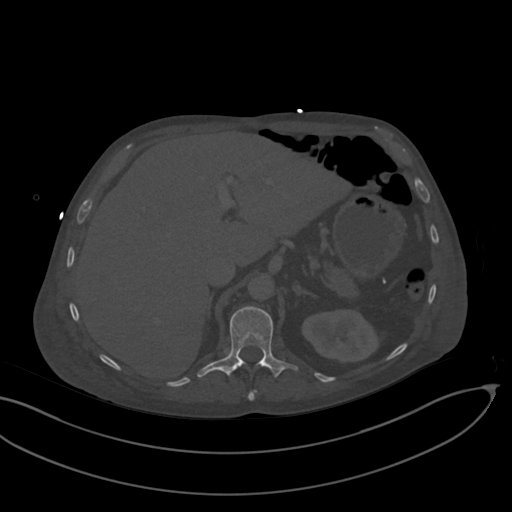
[im 201/221  soft-tissue]
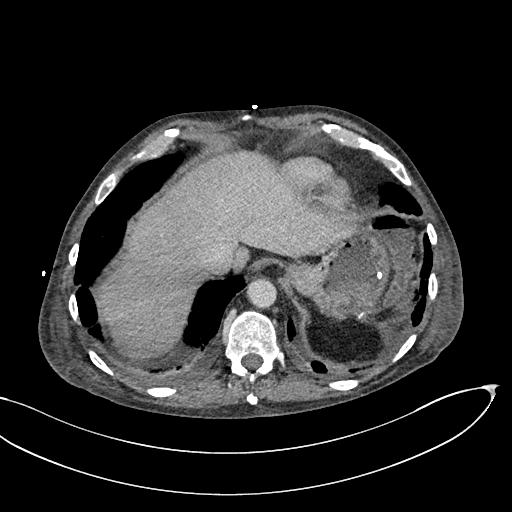

[13 of 46 positions shown; findings below may reference images not displayed]

FINDINGS: VASCULAR

Aorta: Diameter of the distal thoracic aorta at the hiatus measures
2.1 cm.

Atherosclerotic changes of the abdominal aorta without pedunculated
plaque or ulcerated plaque. No dissection.

Greatest diameter of the infrarenal abdominal aorta 3.1 cm,
unchanged.

Celiac: Celiac artery patent. Minimal atherosclerosis. Surgical
ligation of the splenic artery. Left gastric artery patent. Hepatic
artery patent with branches patent.

SMA: Atherosclerotic changes at the SMA origin with mixed calcified
and soft plaque. Branches are patent.

Renals:

- Right: Single right renal artery with minimal atherosclerosis at
the origin. Normal course caliber and contour.

- Left: Single left renal artery with minimal atherosclerosis. No
high-grade stenosis. Normal course caliber and contour.

IMA: IMA is patent.

Right lower extremity:

Unremarkable course, caliber, and contour of the right iliac system.
No aneurysm, dissection, or occlusion. Mild atherosclerosis without
high-grade stenosis or occlusion. Hypogastric artery is patent.
Common femoral artery patent. Proximal SFA and profunda femoris
patent.

Left lower extremity:

Unremarkable course, caliber, and contour of the left iliac system.
No aneurysm, dissection, or occlusion. Mild atherosclerosis with no
high-grade stenosis or occlusion. Hypogastric artery is patent.
Common femoral artery patent. Proximal SFA and profunda femoris
patent.

Veins:

Portal:

Portal vein is patent. Left and right portal veins patent. Superior
mesenteric vein and inferior mesenteric vein are patent. No
calcifications, filling defects, or stenosis. There are no enlarged
gastric varices or esophageal varices identified.

Systemic:

Unremarkable IVC, iliac veins, proximal femoral veins.

Bilateral renal veins patent, unremarkable with no accessory renal
veins identified.

No portosystemic shunt identified.

Review of the MIP images confirms the above findings.

NON-VASCULAR

Lower chest: Small bilateral pleural effusions. Emphysematous
changes at the lung bases. Motion artifact somewhat limits
evaluation. Scarring/atelectasis present, as was previous.

Hepatobiliary: Relatively unremarkable appearance of liver. Slight
heterogeneous enhancement profile of the posterior right liver,
which was present on the comparison, likely perfusion phenomenon. No
focal lesion identified. Partially calcified material within the
gallbladder lumen compatible with cholelithiasis. No inflammatory
changes.

Pancreas: Unremarkable.

Spleen: Splenectomy

Adrenals/Urinary Tract:

- Right adrenal gland: Unremarkable

- Left adrenal gland: Unremarkable.

- Right kidney: No hydronephrosis, nephrolithiasis, inflammation, or
ureteral dilation. No focal lesion.

- Left Kidney: No hydronephrosis, nephrolithiasis, inflammation, or
ureteral dilation. No focal lesion.

- Urinary Bladder: Unremarkable.

Stomach/Bowel:

- Stomach: Surgical clips within the gastric cardia. No
extravasation of contrast or pooling of contrast. Otherwise
unremarkable stomach.

- Small bowel: Small bowel unremarkable.

- Appendix: Normal.

- Colon: No inflammatory changes of the colon. No significant stool
burden. No extravasation of contrast/pooling of contrast.
Diverticular disease within the left colon with no acute
inflammatory changes. Fluid within the distal sigmoid colon and
within the rectum, nonspecific.

Lymphatic: No adenopathy.

Mesenteric: No free fluid or air. No mesenteric adenopathy.

Reproductive: Unremarkable prostate

Other: Fat containing left inguinal hernia. Right inguinal hernia
containing mostly fat. There is a wall of short segment small bowel
which enters the base of the hernia sac of the right inguinal
hernia.

Surgical changes along the midline abdomen with ULISES type hernia
involving transverse colon in the subxiphoid region as well as
transverse colon and small bowel near the umbilicus. No evidence of
entrapment of small bowel or colon.

Edema within the body wall and mesentery compatible with anasarca.

Musculoskeletal: No acute displaced fracture. Degenerative changes
of the spine. No bony canal narrowing. Degenerative changes of the
hips.
IMPRESSION: CT angiogram is negative for gastrointestinal hemorrhage.

Negative for overt signs of portal hypertension, without esophageal
varices, gastric varices, or overt portosystemic shunt.

Evidence of fluid positive state with body wall/mesenteric
edema/anasarca and small bilateral pleural effusions.

Aortic atherosclerosis and redemonstration infrarenal abdominal
aortic aneurysm measuring 3.1 cm. Aortic Atherosclerosis
([9U]-[9U]). Aortic aneurysm NOS ([9U]-[9U]). Recommend
follow-up every 3 years. This recommendation follows ACR consensus
guidelines: White Paper of the ACR Incidental Findings Committee II
on Vascular Findings. [HOSPITAL] [9U]; [DATE].

Additional ancillary findings as above.

## 2020-10-03 MED ORDER — IOHEXOL 350 MG/ML SOLN
100.0000 mL | Freq: Once | INTRAVENOUS | Status: AC | PRN
  Administered 2020-10-03: 100 mL via INTRAVENOUS

## 2020-10-03 MED ORDER — CHLORHEXIDINE GLUCONATE CLOTH 2 % EX PADS
6.0000 | MEDICATED_PAD | Freq: Every day | CUTANEOUS | Status: DC
  Administered 2020-10-05 – 2020-10-06 (×3): 6 via TOPICAL

## 2020-10-03 MED ORDER — CEFAZOLIN SODIUM-DEXTROSE 2-4 GM/100ML-% IV SOLN: 2.0000 g | Freq: Three times a day (TID) | INTRAVENOUS | Status: DC

## 2020-10-03 MED ORDER — SODIUM CHLORIDE 0.9 % IV SOLN
2.0000 g | INTRAVENOUS | Status: AC
  Administered 2020-10-04 – 2020-10-06 (×3): 2 g via INTRAVENOUS
  Filled 2020-10-03 (×2): qty 20
  Filled 2020-10-03: qty 2

## 2020-10-03 MED ORDER — ENSURE ENLIVE PO LIQD
237.0000 mL | Freq: Two times a day (BID) | ORAL | Status: DC
  Administered 2020-10-04 – 2020-10-07 (×6): 237 mL via ORAL

## 2020-10-03 MED ORDER — OXYCODONE HCL 5 MG PO TABS
5.0000 mg | ORAL_TABLET | Freq: Four times a day (QID) | ORAL | Status: AC | PRN
  Administered 2020-10-04 – 2020-10-05 (×5): 5 mg via ORAL
  Filled 2020-10-03 (×5): qty 1

## 2020-10-03 MED ORDER — SODIUM CHLORIDE 0.9 % IV SOLN: 2.0000 g | INTRAVENOUS | Status: DC

## 2020-10-03 MED ORDER — ADULT MULTIVITAMIN W/MINERALS CH
1.0000 | ORAL_TABLET | Freq: Every day | ORAL | Status: DC
  Administered 2020-10-03 – 2020-10-07 (×5): 1 via ORAL
  Filled 2020-10-03 (×5): qty 1

## 2020-10-03 MED ORDER — CEPHALEXIN 500 MG PO CAPS
500.0000 mg | ORAL_CAPSULE | Freq: Four times a day (QID) | ORAL | Status: DC
  Administered 2020-10-06 – 2020-10-07 (×4): 500 mg via ORAL
  Filled 2020-10-03 (×6): qty 1

## 2020-10-03 NOTE — Progress Notes (Signed)
[facility] Progress Note Patient Name: Marcus [name] DOB: Mar 21, 1961 MRN: [id number]   Date of Service  10/03/2020  HPI/Events of Note  Patient needs an order for PRN pain medications.  eICU Interventions  PRN Oxycodone ordered.        Frederik Pear 10/03/2020, 9:38 PM

## 2020-10-03 NOTE — Evaluation (Signed)
Occupational Therapy Evaluation Patient Details Name: Marcus [name] MRN: [id number] DOB: Aug 07, 1960 Today's Date: 10/03/2020    History of Present Illness 60 y.o. male presenting to ED 6/27 after being found down by his mother. Patient admitted with hemoptysis, acute GI bleed, hemorrhagic shock, cavitary PNA and acute anemia s/p EGD 6/27 and bronchoscopy 6/28. Patient with multiple recent hospital admssions with similar complains with most recent 6/14-6/24. PMHx significant for COPD on 2-3L home O2, RA, Felty syndrome, and tobacco use.   Clinical Impression   PTA patient was living with his 41 y.o. mother in a private residence and was grossly I with ADLs/IADLs with intermittent use of RW when he was feeling weak. Uses 3L continuous O2 at home. Patient currently functioning below baseline demonstrating observed ADLs including LB dressing with Min guard and toileting/hygiene/clothing management with Min A. Patient also limited by deficits listed below including generalized weakness, decreased activity tolerance and decreased dynamic standing balance would benefit from continued acute OT services in prep for safe d/c home. Recommendation for [facility] although patient likely to refuse. Will maximize OT services acutely in prep for d/c home.      Follow Up Recommendations  Home health OT;Supervision - Intermittent    Equipment Recommendations  None recommended by OT (Patient has necessary DME.)    Recommendations for Other Services       Precautions / Restrictions Precautions Precautions: Fall Precaution Comments: Monitor HR Restrictions Weight Bearing Restrictions: No      Mobility Bed Mobility Overal bed mobility: Needs Assistance Bed Mobility: Supine to Sit     Supine to sit: Min guard     General bed mobility comments: Min guard for safety/line management. Cues for hand placement.    Transfers Overall transfer level: Needs assistance Equipment used: Rolling walker (2  wheeled) Transfers: Sit to/from [facility] Sit to Stand: Min guard Stand pivot transfers: Min guard       General transfer comment: Min guard for sit to stand from EOB and from [facility] with use of RW. Min guard for stand-pivot to recliner.    Balance Overall balance assessment: Needs assistance Sitting-balance support: Single extremity supported;No upper extremity supported;Feet supported Sitting balance-Leahy Scale: Good     Standing balance support: Bilateral upper extremity supported;During functional activity Standing balance-Leahy Scale: Fair Standing balance comment: Able to maintain static standing balance without UE support on RW during hygiene/clothing management with Min guard for safety.                           ADL either performed or assessed with clinical judgement   ADL Overall ADL's : Needs assistance/impaired     Grooming: Set up;Sitting               Lower Body Dressing: Min guard;Sit to/from stand Lower Body Dressing Details (indicate cue type and reason): Able to don footwear seated EOB without external assist. Toilet Transfer: Min Fish farm manager Details (indicate cue type and reason): To BSC with use of RW. Min gaurd for steadying. Toileting- Clothing Manipulation and Hygiene: Minimal assistance;Sit to/from stand Toileting - Clothing Manipulation Details (indicate cue type and reason): Min A for hygiene/clothing management in standing.     Functional mobility during ADLs: Min guard;Rolling walker       Vision Baseline Vision/History: No visual deficits Vision Assessment?: No apparent visual deficits     Perception     Praxis      Pertinent Vitals/Pain Pain  Assessment: No/denies pain     Hand Dominance Left   Extremity/Trunk Assessment Upper Extremity Assessment Upper Extremity Assessment: Generalized weakness;RUE deficits/detail;LUE deficits/detail RUE Deficits / Details: AROM WFL with exception of  MCP joints 2-5 with noted limited extension. Patient reports this is baseline and 2/2 arthritis. LUE Deficits / Details: AROM WFL with exception of MCP joints 2-5 with noted limited extension. Patient reports this is baseline and 2/2 arthritis.   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 Other VSS on 3L O2 via [place].    Exercises     Shoulder Instructions      Home Living Family/patient expects to be discharged to:: Private residence Living Arrangements: Parent Available Help at Discharge: Family Type of Home: House Home Access: Stairs to enter Technical brewer of Steps: 4 Entrance Stairs-Rails: Right;Left;Can reach both [place]: One level     Bathroom Shower/Tub: Teacher, early years/pre: [place]: Environmental consultant - 2 wheels;Cane - single point;Other (comment) (3L home O2)          Prior Functioning/Environment Level of Independence: Needs assistance  Gait / Transfers Assistance Needed: RW intermittently in household and community dwellings. ADL's / Homemaking Assistance Needed: I with ADLs/IADLs; drives; on disability   Comments: Mother will be 11 y.o. next month. I with ADLs, shared responsibility with son for IADLs; does not drive.        OT Problem List: Decreased strength;Decreased activity tolerance;Impaired balance (sitting and/or standing)      OT  Treatment/Interventions: Self-care/ADL training;Therapeutic exercise;Energy conservation;Therapeutic activities;Patient/family education;Balance training    OT Goals(Current goals can be found in the care plan section) Acute Rehab OT Goals Patient Stated Goal: To return home. OT Goal Formulation: With patient Time For Goal Achievement: 10/17/20 Potential to Achieve Goals: Good ADL Goals Pt Will Perform Grooming: Independently;standing Pt Will Perform Upper Body Dressing: Independently;sitting Pt Will Perform Lower Body Dressing: Independently;sit to/from stand Pt Will Transfer to Toilet: Independently;ambulating Pt Will Perform Toileting - Clothing Manipulation and hygiene: Independently;sit to/from stand Additional ADL Goal #1: Patient will tolerate 15 minutes of therapeutic activity without need for rest break indicating increased activity tolerance.  OT Frequency: Min 2X/week   Barriers to D/C:            Co-evaluation              AM-PAC OT "6 Clicks" Daily Activity     Outcome Measure Help from another person eating meals?: None Help from another person taking care of personal grooming?: A Little Help from another person toileting, which includes using toliet, bedpan, or urinal?: A Little Help from another person bathing (including washing, rinsing, drying)?: A Little Help from another person to put on and taking off regular upper body clothing?: A Little Help from another person to put on and taking off regular lower body clothing?: A Little  6 Click Score: 19   End of Session Equipment Utilized During Treatment: Gait belt;Rolling walker;Oxygen Nurse Communication: Mobility status;Other (comment) (Response to treatment.)  Activity Tolerance: Patient tolerated treatment well Patient left: in chair;with call bell/phone within reach;with chair alarm set  OT Visit Diagnosis: Unsteadiness on feet (R26.81);Muscle weakness (generalized) (M62.81)                Time:  4709-6283 OT Time Calculation (min): 37 min Charges:  OT General Charges $OT Visit: 1 Visit OT Evaluation $OT Eval Moderate Complexity: 1 Mod OT Treatments $Self Care/Home Management : 8-22 mins  Margo Lama H. OTR/L Supplemental OT, Department of rehab services [phone]  Marcus [name] R H. 10/03/2020, 3:00 PM

## 2020-10-03 NOTE — Progress Notes (Signed)
SLP Cancellation Note  Patient Details Name: Marcus [name] MRN: [id number] DOB: September 11, 1960   Cancelled treatment:       Reason Eval/Treat Not Completed: SLP screened, no needs identified, will sign off. Pt seen with clear liquid tray at bedside. RN does not have concerns about pts swallowing.    Rogina Schiano, Katherene Ponto 10/03/2020, 11:23 AM

## 2020-10-03 NOTE — Progress Notes (Signed)
PT Cancellation Note  Patient Details Name: Marcus [name] MRN: [id number] DOB: [birth year]   Cancelled Treatment:    Reason Eval/Treat Not Completed: Patient at procedure or test/unavailable  Per RN, pt about to go down for CT. Will re-attempt PT evaluation as schedule permits.    Arby Barrette, PT Pager [phone]  Rexanne Mano 10/03/2020, 9:35 AM

## 2020-10-03 NOTE — Evaluation (Signed)
Physical Therapy Evaluation Patient Details Name: Marcus [name] MRN: [id number] DOB: 06-Feb-1961 Today's Date: 10/03/2020   History of Present Illness  60 y.o. male presenting to ED 6/27 after being found down by his mother. Patient admitted with hemoptysis, acute GI bleed, hemorrhagic shock, cavitary PNA and acute anemia s/p EGD 6/27 and bronchoscopy 6/28. Patient with multiple recent hospital admssions with similar complains with most recent 6/14-6/24. PMHx significant for COPD on 2-3L home O2, RA, Felty syndrome, and tobacco use.  Clinical Impression   Pt admitted with above diagnosis. At baseline pt resides with his elderly mother and is independent with ADLs and ambulation.  He does occasionally use a RW.  At evaluation, pt ambulating 15'x2 but was limited due to stool incontinence.  All VSS during therapy. Pt expected to progress well with therapy.  Pt currently with functional limitations due to the deficits listed below (see PT Problem List). Pt will benefit from skilled PT to increase their independence and safety with mobility to allow discharge to the venue listed below.       Follow Up Recommendations Home health PT;Supervision - Intermittent (Pt may end up declining HHPT)    Equipment Recommendations  None recommended by PT    Recommendations for Other Services       Precautions / Restrictions Precautions Precautions: Fall Precaution Comments: Monitor HR Restrictions Weight Bearing Restrictions: No      Mobility  Bed Mobility Overal bed mobility: Needs Assistance Bed Mobility: Supine to Sit     Supine to sit: Min guard     General bed mobility comments: Min guard for safety/line management. Cues for hand placement.    Transfers Overall transfer level: Needs assistance Equipment used: Rolling walker (2 wheeled) Transfers: Sit to/from [facility] Sit to Stand: Min guard Stand pivot transfers: Min guard       General transfer comment: Min guard  for sit to stand from EOB and from [facility] with use of RW. Min guard for stand-pivot to recliner. Cues for hand placement  Ambulation/Gait Ambulation/Gait assistance: Min guard Gait Distance (Feet): 15 Feet (15'x2) Assistive device: Rolling walker (2 wheeled) Gait Pattern/deviations: Decreased stride length;Trunk flexed Gait velocity: decreased   General Gait Details: Pt ambulated 15' then had episode of bowel incontinence. Retrieved BSC for pt.  After using BSC and being clean - pt ambulated back to chair.  Limited distance due to bowel incontinence.  Stairs            Wheelchair Mobility    Modified Rankin (Stroke Patients Only)       Balance Overall balance assessment: Needs assistance Sitting-balance support: No upper extremity supported;Feet supported Sitting balance-Leahy Scale: Good     Standing balance support: Bilateral upper extremity supported;No upper extremity supported Standing balance-Leahy Scale: Fair Standing balance comment: Able to maintain static standing balance without UE support on RW during hygiene/clothing management with Min guard for safety. RW to ambulate                             Pertinent Vitals/Pain Pain Assessment: No/denies pain    Home Living Family/patient expects to be discharged to:: Private residence Living Arrangements: Parent Available Help at Discharge: Family;Available PRN/intermittently Type of Home: House Home Access: Stairs to enter Entrance Stairs-Rails: Right;Left;Can reach both Entrance Stairs-Number of Steps: 4 Home Layout: One level Home Equipment: Walker - 2 wheels;Cane - single point;Other (comment) Additional Comments: 3 L home O2  Prior Function Level of Independence: Needs assistance   Gait / Transfers Assistance Needed: RW intermittently in household and community  ADL's / Homemaking Assistance Needed: I with ADLs/IADLs; drives; on disability  Comments: Mother will be 63 y.o. next month. I  with ADLs, shared responsibility with son for IADLs; does not drive.     Hand Dominance   Dominant Hand: Left    Extremity/Trunk Assessment   Upper Extremity Assessment Upper Extremity Assessment: Defer to OT evaluation     Lower Extremity Assessment Lower Extremity Assessment: LLE deficits/detail;RLE deficits/detail RLE Deficits / Details: ROM WFL; MMT 5/5 LLE Deficits / Details: ROM WFL; MMT 5/5    Cervical / Trunk Assessment Cervical / Trunk Assessment: Other exceptions Cervical / Trunk Exceptions: .  Communication   Communication: No difficulties;Other (comment) (pt reports unable to read)  Cognition Arousal/Alertness: Awake/alert Behavior During Therapy: WFL for tasks assessed/performed Overall Cognitive Status: Within Functional Limits for tasks assessed                                 General Comments: A&Ox4      General Comments General comments (skin integrity, edema, etc.): Max HR 126bpm with mobility. Other VSS on 3L O2 via [place].    Exercises     Assessment/Plan    PT Assessment Patient needs continued PT services  PT Problem List Decreased strength;Decreased activity tolerance;Decreased balance;Decreased mobility;Cardiopulmonary status limiting activity;Decreased safety awareness;Pain;Decreased range of motion;Decreased knowledge of use of DME       PT Treatment Interventions DME instruction;Gait training;Stair training;Functional mobility training;Therapeutic activities;Patient/family education;Balance training;Therapeutic exercise;Manual techniques    PT Goals (Current goals can be found in the Care Plan section)  Acute Rehab PT Goals Patient Stated Goal: To return home. PT Goal Formulation: With patient Time For Goal Achievement: 10/17/20 Potential to Achieve Goals: Good    Frequency Min 3X/week   Barriers to discharge        Co-evaluation               AM-PAC PT "6 Clicks" Mobility  Outcome Measure Help needed turning  from your back to your side while in a flat bed without using bedrails?: A Little Help needed moving from lying on your back to sitting on the side of a flat bed without using bedrails?: A Little Help needed moving to and from a bed to a chair (including a wheelchair)?: A Little Help needed standing up from a chair using your arms (e.g., wheelchair or bedside chair)?: A Little Help needed to walk in hospital room?: A Little Help needed climbing 3-5 steps with a railing? : A Little 6 Click Score: 18    End of Session Equipment Utilized During Treatment: Gait belt;Oxygen Activity Tolerance: Patient tolerated treatment well (limited due to stool incontinence) Patient left: with chair alarm set;in chair;with call bell/phone within reach Nurse Communication: Mobility status PT Visit Diagnosis: Unsteadiness on feet (R26.81);Muscle weakness (generalized) (M62.81);Difficulty in walking, not elsewhere classified (R26.2)    Time: 9528-4132 PT Time Calculation (min) (ACUTE ONLY): 32 min   Charges:   PT Evaluation $PT Eval Moderate Complexity: 1 Melina Schools, PT Acute Rehab Services Pager [phone] [facility] [phone]   Karlton Lemon 10/03/2020, 3:41 PM

## 2020-10-03 NOTE — Progress Notes (Signed)
[facility]  Date of Admission:  09/30/2020     Total days of antibiotics 13         ASSESSMENT:  Mr. Studnicka respiratory culture gram stain has gram positive cocci in pairs in clusters and would anticipate this is likely to be MRSA as previously noted. Pathology remains pending. CT abdomen with hemorrhage or varices. Currently on nasal cannula. Continue current dose of vancomycin and will narrow ceftriaxone to cefazolin for duration of treatment for Klebsiella bacteremia. Follow up on respiratory cultures and pathology.   PLAN:  Continue current dose of vancomycin and change ceftriaxone to cefazolin. Await respiratory cultures and pathology results.  GI bleeding management per Gastroenterology and Primary Team.   Principal Problem:   Hemoptysis Active Problems:   Felty syndrome (HCC)   Cavitary pneumonia   GI bleed   Hemorrhagic shock (HCC)   Acute blood loss anemia   Bleeding gastric varices   Bacteremia   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HCC)    arformoterol  15 mcg Nebulization BID   chlorhexidine  15 mL Mouth Rinse BID   Chlorhexidine Gluconate Cloth  6 each Topical Daily   hydrocortisone sod succinate (SOLU-CORTEF) inj  50 mg Intravenous Q8H   mouth rinse  15 mL Mouth Rinse q12n4p   mupirocin ointment  1 application Nasal BID   nicotine  21 mg Transdermal Daily   pantoprazole (PROTONIX) IV  40 mg Intravenous Q12H   sodium chloride flush  10-40 mL Intracatheter Q12H   tranexamic acid  500 mg Nebulization Q8H    SUBJECTIVE:  Afebrile overnight with no acute events. Bronchoscopy performed with culture and pathology pending. Extubated. Feeling okay today. No new concerns/complaints. Denies fevers/chills.   Allergies  Allergen Reactions   Levaquin [Levofloxacin In D5w] Other (See Comments)    Chest pain   Methotrexate Other (See Comments)    Chest pain     Review of Systems: Review of Systems  Constitutional:   Negative for chills, fever and weight loss.  Respiratory:  Negative for cough, shortness of breath and wheezing.   Cardiovascular:  Negative for chest pain and leg swelling.  Gastrointestinal:  Negative for abdominal pain, constipation, diarrhea, nausea and vomiting.  Skin:  Negative for rash.     OBJECTIVE: Vitals:   10/03/20 0800 10/03/20 0801 10/03/20 0804 10/03/20 1138  BP:      Pulse: 68 (!) 56 (!) 56   Resp: 18 (!) 21 (!) 21   Temp:    97.6 F (36.4 C)  TempSrc:    Oral  SpO2: 100% 100% 100%   Weight:      Height:       Body mass index is 21.03 kg/m.  Physical Exam Constitutional:      General: He is not in acute distress.    Appearance: He is well-developed.     Comments: Lying in bed with head of bed elevated. Pleasant.   Cardiovascular:     Rate and Rhythm: Normal rate and regular rhythm.     Heart sounds: Normal heart sounds.  Pulmonary:     Effort: Pulmonary effort is normal.     Breath sounds: Normal breath sounds.  Skin:    General: Skin is warm and dry.  Neurological:     Mental Status: He is alert and oriented to person, place, and time.    Lab Results Lab Results  Component Value Date   WBC 2.1 (L) 10/03/2020   HGB 9.0 (  L) 10/03/2020   HCT 26.1 (L) 10/03/2020   MCV 90.3 10/03/2020   PLT 109 (L) 10/03/2020    Lab Results  Component Value Date   CREATININE 0.51 (L) 10/03/2020   BUN 36 (H) 10/03/2020   NA 146 (H) 10/03/2020   K 3.7 10/03/2020   CL 116 (H) 10/03/2020   CO2 25 10/03/2020    Lab Results  Component Value Date   ALT 164 (H) 10/03/2020   AST 113 (H) 10/03/2020   ALKPHOS 103 10/03/2020   BILITOT 1.2 10/03/2020     Microbiology: Recent Results (from the past 240 hour(s))  Aspergillus Ag, BAL/Serum     Status: None   Collection Time: 09/25/20 10:54 AM  Result Value Ref Range Status   Aspergillus Ag, BAL/Serum 0.04 0.00 - 0.49 Index Final    Comment: (NOTE) Performed At: [facility] [address] [place], [place]  [id number] Rush Farmer MD HQ:[id number]   Blood culture (routine x 2)     Status: None (Preliminary result)   Collection Time: 09/30/20  9:40 AM   Specimen: BLOOD  Result Value Ref Range Status   Specimen Description BLOOD LEFT HAND  Final   Special Requests   Final    BOTTLES DRAWN AEROBIC AND ANAEROBIC Blood Culture adequate volume   Culture   Final    NO GROWTH 3 DAYS Performed at [facility], [address], [place], [place] [address]    Report Status PENDING  Incomplete  Blood culture (routine x 2)     Status: Abnormal   Collection Time: 09/30/20  9:40 AM   Specimen: BLOOD  Result Value Ref Range Status   Specimen Description   Final    BLOOD RIGHT UPPER HAND Performed at [facility], [address], [place], [place] [address]    Special Requests   Final    BOTTLES DRAWN AEROBIC AND ANAEROBIC Blood Culture adequate volume Performed at [facility], [address] [place], [place] [address]    Culture  Setup Time   Final    GRAM NEGATIVE RODS ANAEROBIC BOTTLE ONLY CRITICAL RESULT CALLED TO, READ BACK BY AND VERIFIED WITHDayton Bailiff ROBINS @ [id number] 09/30/2020 LFD Performed at [facility], [place] [address], [place], [place] [address]    Culture KLEBSIELLA PNEUMONIAE (A)  Final   Report Status 10/03/2020 FINAL  Final   Organism ID, Bacteria KLEBSIELLA PNEUMONIAE  Final      Susceptibility   Klebsiella pneumoniae - MIC*    AMPICILLIN >=32 RESISTANT Resistant     CEFAZOLIN <=4 SENSITIVE Sensitive     CEFEPIME <=0.12 SENSITIVE Sensitive     CEFTAZIDIME <=1 SENSITIVE Sensitive     CEFTRIAXONE <=0.25 SENSITIVE Sensitive     CIPROFLOXACIN <=0.25 SENSITIVE Sensitive     GENTAMICIN <=1 SENSITIVE Sensitive     IMIPENEM <=0.25 SENSITIVE Sensitive     TRIMETH/SULFA <=20 SENSITIVE Sensitive     AMPICILLIN/SULBACTAM 16 INTERMEDIATE Intermediate     PIP/TAZO 16 SENSITIVE Sensitive     * KLEBSIELLA PNEUMONIAE  Blood Culture ID Panel (Reflexed)      Status: Abnormal   Collection Time: 09/30/20  9:40 AM  Result Value Ref Range Status   Enterococcus faecalis NOT DETECTED NOT DETECTED Final   Enterococcus Faecium NOT DETECTED NOT DETECTED Final   Listeria monocytogenes NOT DETECTED NOT DETECTED Final   Staphylococcus species NOT DETECTED NOT DETECTED Final   Staphylococcus aureus (BCID) NOT DETECTED NOT DETECTED Final   Staphylococcus epidermidis NOT  DETECTED NOT DETECTED Final   Staphylococcus lugdunensis NOT DETECTED NOT DETECTED Final   Streptococcus species NOT DETECTED NOT DETECTED Final   Streptococcus agalactiae NOT DETECTED NOT DETECTED Final   Streptococcus pneumoniae NOT DETECTED NOT DETECTED Final   Streptococcus pyogenes NOT DETECTED NOT DETECTED Final   A.calcoaceticus-baumannii NOT DETECTED NOT DETECTED Final   Bacteroides fragilis NOT DETECTED NOT DETECTED Final   Enterobacterales DETECTED (A) NOT DETECTED Final    Comment: Enterobacterales represent a large order of gram negative bacteria, not a single organism. CRITICAL RESULT CALLED TO, READ BACK BY AND VERIFIED WITH: JASON ROBINS @ 3299 09/30/20 LFD    Enterobacter cloacae complex NOT DETECTED NOT DETECTED Final   Escherichia coli NOT DETECTED NOT DETECTED Final   Klebsiella aerogenes NOT DETECTED NOT DETECTED Final   Klebsiella oxytoca NOT DETECTED NOT DETECTED Final   Klebsiella pneumoniae DETECTED (A) NOT DETECTED Final    Comment: CRITICAL RESULT CALLED TO, READ BACK BY AND VERIFIED WITH: JASON ROBINS _0  09/30/2020 LFD    Proteus species NOT DETECTED NOT DETECTED Final   Salmonella species NOT DETECTED NOT DETECTED Final   Serratia marcescens NOT DETECTED NOT DETECTED Final   Haemophilus influenzae NOT DETECTED NOT DETECTED Final   Neisseria meningitidis NOT DETECTED NOT DETECTED Final   Pseudomonas aeruginosa NOT DETECTED NOT DETECTED Final   Stenotrophomonas maltophilia NOT DETECTED NOT DETECTED Final   Candida albicans NOT DETECTED NOT DETECTED  Final   Candida auris NOT DETECTED NOT DETECTED Final   Candida glabrata NOT DETECTED NOT DETECTED Final   Candida krusei NOT DETECTED NOT DETECTED Final   Candida parapsilosis NOT DETECTED NOT DETECTED Final   Candida tropicalis NOT DETECTED NOT DETECTED Final   Cryptococcus neoformans/gattii NOT DETECTED NOT DETECTED Final   CTX-M ESBL NOT DETECTED NOT DETECTED Final   Carbapenem resistance IMP NOT DETECTED NOT DETECTED Final   Carbapenem resistance KPC NOT DETECTED NOT DETECTED Final   Carbapenem resistance NDM NOT DETECTED NOT DETECTED Final   Carbapenem resist OXA 48 LIKE NOT DETECTED NOT DETECTED Final   Carbapenem resistance VIM NOT DETECTED NOT DETECTED Final    Comment: Performed at [facility], [address] [place], [place] [address]  Resp Panel by RT-PCR (Flu A&B, Covid) Nasopharyngeal Swab     Status: None   Collection Time: 09/30/20 10:16 AM   Specimen: Nasopharyngeal Swab; Nasopharyngeal(NP) swabs in vial transport medium  Result Value Ref Range Status   SARS Coronavirus 2 by RT PCR NEGATIVE NEGATIVE Final    Comment: (NOTE) SARS-CoV-2 target nucleic acids are NOT DETECTED.  The SARS-CoV-2 RNA is generally detectable in upper respiratory specimens during the acute phase of infection. The lowest concentration of SARS-CoV-2 viral copies this assay can detect is 138 copies/mL. A negative result does not preclude SARS-Cov-2 infection and should not be used as the sole basis for treatment or other patient management decisions. A negative result may occur with  improper specimen collection/handling, submission of specimen other than nasopharyngeal swab, presence of viral mutation(s) within the areas targeted by this assay, and inadequate number of viral copies(<138 copies/mL). A negative result must be combined with clinical observations, patient history, and epidemiological information. The expected result is Negative.  Fact Sheet for Patients:   EntrepreneurPulse.com.au  Fact Sheet for Healthcare Providers:  IncredibleEmployment.be  This test is no t yet approved or cleared by the [place] FDA and  has been authorized for detection and/or diagnosis of SARS-CoV-2 by FDA under an Emergency Use Authorization (EUA).  This EUA will remain  in effect (meaning this test can be used) for the duration of the COVID-19 declaration under Section 564(b)(1) of the Act, 21 U.S.C.section 360bbb-3(b)(1), unless the authorization is terminated  or revoked sooner.       Influenza A by PCR NEGATIVE NEGATIVE Final   Influenza B by PCR NEGATIVE NEGATIVE Final    Comment: (NOTE) The Xpert Xpress SARS-CoV-2/FLU/RSV plus assay is intended as an aid in the diagnosis of influenza from Nasopharyngeal swab specimens and should not be used as a sole basis for treatment. Nasal washings and aspirates are unacceptable for Xpert Xpress SARS-CoV-2/FLU/RSV testing.  Fact Sheet for Patients: EntrepreneurPulse.com.au  Fact Sheet for Healthcare Providers: IncredibleEmployment.be  This test is not yet approved or cleared by the [place] FDA and has been authorized for detection and/or diagnosis of SARS-CoV-2 by FDA under an Emergency Use Authorization (EUA). This EUA will remain in effect (meaning this test can be used) for the duration of the COVID-19 declaration under Section 564(b)(1) of the Act, 21 U.S.C. section 360bbb-3(b)(1), unless the authorization is terminated or revoked.  Performed at [facility], [address] [place], [place] [address]   MRSA Next Gen by PCR, Nasal     Status: Abnormal   Collection Time: 10/01/20  5:21 PM   Specimen: Nasal Mucosa; Nasal Swab  Result Value Ref Range Status   MRSA by PCR Next Gen DETECTED (A) NOT DETECTED Final    Comment: RESULT CALLED TO, READ BACK BY AND VERIFIED WITH: Seward Carol RN [id number] 6/57/22 A  [place] (NOTE) The GeneXpert MRSA Assay (FDA approved for NASAL specimens only), is one component of a comprehensive MRSA colonization surveillance program. It is not intended to diagnose MRSA infection nor to guide or monitor treatment for MRSA infections. Test performance is not FDA approved in patients less than 59 years old. Performed at [facility], [place] [address], [place], [place] [address]   Culture, Respiratory w Gram Stain     Status: None (Preliminary result)   Collection Time: 10/02/20 11:02 AM   Specimen: Bronchoalveolar Lavage; Respiratory  Result Value Ref Range Status   Specimen Description BRONCHIAL ALVEOLAR LAVAGE  Final   Special Requests NONE  Final   Gram Stain   Final    ABUNDANT WBC PRESENT, PREDOMINANTLY PMN FEW GRAM POSITIVE COCCI IN PAIRS IN CLUSTERS    Culture   Final    CULTURE REINCUBATED FOR BETTER GROWTH Performed at [facility], [place] [address] [place], [place] [address]    Report Status PENDING  Incomplete     Terri Piedra, NP [facility]  10/03/2020  11:49 AM

## 2020-10-03 NOTE — Progress Notes (Signed)
Initial Nutrition Assessment  DOCUMENTATION CODES:   Severe malnutrition in context of chronic illness  INTERVENTION:   Ensure Enlive po BID, each supplement provides 350 kcal and 20 grams of protein Magic cup TID with meals, each supplement provides 290 kcal and 9 grams of protein MVI daily   NUTRITION DIAGNOSIS:   Severe Malnutrition related to chronic illness as evidenced by percent weight loss, severe fat depletion, severe muscle depletion.  GOAL:   Patient will meet greater than or equal to 90% of their needs   MONITOR:   PO intake, Supplement acceptance, Weight trends, Labs, I & O's  REASON FOR ASSESSMENT:   Rounds    ASSESSMENT:   Patient with PMH significant for COPD, Felty syndrome, RA, cavitary PNA due to MRSA, and hernia. Presents this admission with persistent hemoptysis and acute GI bleed.  6/27 EGD suspicious for hemorrhage from isolated gastric varices, concern for early cirrhosis   Bleeding has stopped.   Patient denies decreased intake at home. Unable to provide detailed meal composition. Per last RD note on 6/15, patient consumes items his mother prepares such as lasagna, chicken, and steak. During his last hospitalization 6/15-6/21, patient consumed 100% of his meals. Diet has been advanced to SOFT. RD to provide supplementation to maximize kcal and protein this admission.   Weight noted to decline from 79.4 kg on 11/30/19 to 64.6 kg this admission (18.6% wt loss in 10 months, significant for time frame).   UOP: 750 ml x 24 hrs   Medications: solucortef, octreotide Labs: Na 146 (H) CBG 118-210  NUTRITION - FOCUSED PHYSICAL EXAM:  Flowsheet Row Most Recent Value  Orbital Region Moderate depletion  Upper Arm Region Severe depletion  Thoracic and Lumbar Region Unable to assess  Buccal Region Severe depletion  Temple Region Severe depletion  Clavicle Bone Region Severe depletion  Clavicle and Acromion Bone Region Severe depletion  Scapular Bone  Region Unable to assess  Dorsal Hand Severe depletion  Patellar Region Unable to assess  Anterior Thigh Region Unable to assess  Posterior Calf Region Unable to assess  Edema (RD Assessment) Unable to assess  Hair Reviewed  Eyes Reviewed  Mouth Unable to assess  Skin Reviewed  Nails Unable to assess      Diet Order:   Diet Order             DIET SOFT Room service appropriate? Yes; Fluid consistency: Thin  Diet effective now                   EDUCATION NEEDS:   Not appropriate for education at this time  Skin:  Skin Assessment: Skin Integrity Issues: Skin Integrity Issues:: Incisions Incisions: R groin  Last BM:  6/29  Height:   Ht Readings from Last 1 Encounters:  09/30/20 5\' 9"  (1.753 m)    Weight:   Wt Readings from Last 1 Encounters:  10/03/20 64.6 kg    BMI:  Body mass index is 21.03 kg/m.  Estimated Nutritional Needs:   Kcal:  2000-2000 kcal  Protein:  100-120 grams  Fluid:  >/= 2 L/day  Mariana Single MS, RD, LDN, CNSC Clinical Nutrition Pager listed in [place]

## 2020-10-03 NOTE — Progress Notes (Addendum)
NAME:  Marcus [name], MRN:  [id number], DOB:  1960/11/04, LOS: 3 ADMISSION DATE:  09/30/2020, CONSULTATION DATE: 6/26 REFERRING MD: Dr. Maggie Font CHIEF COMPLAINT: Hemoptysis  History of Present Illness:  60-year-old male with multiple medical problems including COPD on prednisone, chronic hypoxic respiratory failure on 2 L, Felty syndrome including rheumatoid arthritis and pancytopenia, and cavitary lung lesion.  He was recently admitted to [facility] for hemoptysis leading to shortness of breath.  He underwent CT angiogram of the chest which demonstrated right upper lobe cavitary lesion increasing in size and findings consistent of a cavitary/necrotizing pneumonia.  During that admission he was initially treated with IV antibiotics and infectious disease specialist were consulted.  Respiratory culture grew MRSA and Candida dublisiensis, Fungitell was negative.  He was treated with Diflucan as well which was discontinued by ID as it was felt to be a colonizer.  He was ultimately discharged 6/24 on a 3-week course of linezolid and Flagyl.  He presented again to [facility] emergency department on 6/26 with complaints of massive hemoptysis after being found down at home with blood on the floor around him.  Upon arrival to the emergency department he had finding concerning for hemorrhagic shock with systolic blood pressure 64 and tachycardia with rates in the 120s.  He was provided with 2 units of emergency release blood.  Hemoglobin resulted at 8.0, which is down from 10.6 just 2 days prior.  Overall, he did improve after blood transfusion and crystalloid infusions.  There was some concern that the bleeding may be coming from a GI source, the patient was seen by GI without any plans for intervention.  Hemoptysis slowed down considerably in the emergency department and the patient's clinical status improved.  He was transferred to progressive care [facility] for pulmonary, CVTS, and IR evaluation.  Pertinent   Medical History   has a past medical history of COPD (chronic obstructive pulmonary disease) ([place]), Felty syndrome ([place]), Hernia, epigastric, Pancytopenia ([place]), Seropositive rheumatoid arthritis ([place]), and Tobacco use disorder.  No hx of significant ETOH use.   Significant Hospital Events: Including procedures, antibiotic start and stop dates in addition to other pertinent events   6/26 Presented to [facility] with hemoptysis, transferred to [facility]  6/27 To Endo for EGD/Colonoscopy, s/p 2 units PRBC.  EGD with concern for Grade D esophagitis, type 1 varices s/p clipping.  Intubated, dual lumen central access and aline placed.  To IR for possible embolization but no bleeding vessels seen on angiogram.  Received 2 units PRBC during case and 2 units FFP. F/u Hgb >11 6/28 Intermittent episodes of hypothermia, bradycardia that improved with warming. 1LNS bolus for hypotension.  Hgb stable, on octreotide. FOB completed.  Extubated in pm.    Interim History / Subjective:  Temp 97.5  BAL pending 2L [place] Glucose range 118-125  I/O 737m UOP / +2.8L in last 24 hours  Pt reports he feels very thirsty, asking for water  No bleeding overnight.  Objective   Blood pressure 134/73, pulse (!) 54, temperature (!) 97.5 F (36.4 C), temperature source Oral, resp. rate 20, height _0  (1.753 m), weight 64.6 kg, SpO2 100 %.    Vent Mode: CPAP;PSV FiO2 (%):  [40 %] 40 % Set Rate:  [18 bmp] 18 bmp Vt Set:  [560 mL] 560 mL PEEP:  [5 cmH20] 5 cmH20 Pressure Support:  [5 cmH20] 5 cmH20 Plateau Pressure:  [14 cmH20] 14 cmH20   Intake/Output Summary (Last 24 hours) at 10/03/2020 0715 Last data  filed at 10/03/2020 0606 Gross per 24 hour  Intake 1244.03 ml  Output 752 ml  Net 492.03 ml   Filed Weights   09/30/20 2145 10/02/20 0115 10/03/20 0600  Weight: 61.1 kg 64.6 kg 64.6 kg    Examination: General: adult male lying in bed in NAD HEENT: MM pink/moist, [place] O2, anicteric  Neuro: AAOx4, speech clear, MAE CV:  s1s2 RRR, no m/r/g PULM: non-labored on 2L [place], lungs bilaterally clear GI: soft, bsx4 active  Extremities: warm/dry, no edema  Skin: no rashes or lesions   Resolved Hospital Problem list   Hemorrhagic shock  AKI, hyperkalemia  NAGMA - in setting of diarrhea / GI loss   Assessment & Plan:   Acute Blood Loss Anemia Thrombocytopenia  Shock resolved. Hemoptysis is certainly present, EGD showed variceal bleeding s/p clipping.  Went to IR for embolization but no focused area of bleeding. Hemoglobin stable.  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 On daily prednisone use for RA, no NSAID's PTA. EGD with esophagitis, gastric varices on 6/27.  To IR for embolization 6/27 but no stigmata of active bleeding on angiogram.   -appreciate GI/IR assistance  -PPI BID  -continue octreotide  -will need pneumococcal vaccine in August 2022 / or before discharge   Hemoptysis secondary to [name], possible Mycetoma.  Cavitary Pneumonia Recent MRSA / Prevotella in Lungs, Klebsiella PNA Bacteremia Acute on Chronic Hypoxemic Respiratory Failure Tobacco Abuse  CT imaging does not suggest massive hemoptysis / evidence of significant aspiration.  Imaging comparison from 6/13 actually appears somewhat improved.  He has had some hemoptysis for many months with ongoing evidence of bleed in ETT. FOB 6/28 with predominance of blood in RUL.  -wean O2 for sats >90% -monitor hemoptysis volume -continue abx per ID - vanco/flagyl/ceftriaxone -follow up fungitell (repeat)  -follow up FOB > note abundant WBC, GPC's in clusters -continue tranexamic acid nebs   COPD without Exacerbation -continue brovana BID, dulera PRN  -hold home Dulera   RA -continue stress dose steroids, consider transition back to home prednisone when taking PO's  Felty Syndrome   Neutropenia Hx -supportive care  -consider anti-glucose-6-phosphate isomerase antibody + immune complexes -follow up with [facility] post discharge    Best Practice (right click and "Reselect all SmartList Selections" daily)   Diet/type: NPO Pain/Anxiety/Delirium protocol Not indicated VAP protocol (if indicated): Not indicated DVT prophylaxis: SCD GI prophylaxis: PPI Glucose control:  not indicated Central venous access:  Yes, and it is still needed Arterial line:  N/A Foley:  N/A Mobility:  bed rest  PT consulted: N/A Studies pending: None Culture data pending:sputum and blood Last reviewed culture data:today Antibiotics:ceftriaxone, vanc, and flagyl  Antibiotic de-escalation: no,  continue current rx Stop date: N/A Code Status:  full code Last date of multidisciplinary goals of care discussion: Brother updated at bedside 6/27 post procedure in ICU on plan of care.  Full code.   ccm prognosis: Serious Disposition: remains critically ill, will stay in intensive care.  Consider transfer to PCU if remains stable.    Critical care time:       Noe Gens, MSN, APRN, NP-C, AGACNP-BC [facility] 10/03/2020, 7:35 AM   Please see [email or web address] for pager details.   From 7A-7P if no response, please call [phone] After hours, please call ELink [phone]

## 2020-10-03 NOTE — Progress Notes (Signed)
[facility] ROUNDING NOTE   Subjective: Extubated, sitting in chair, tolerating liquids diet. Hgb remains stable   Objective: Vital signs in last 24 hours: Temp:  [93.4 F (34.1 C)-97.6 F (36.4 C)] 97.6 F (36.4 C) (06/29 1138) Pulse Rate:  [45-77] 73 (06/29 1200) Resp:  [11-28] 28 (06/29 1200) BP: (117-167)/(60-86) 166/78 (06/29 1100) SpO2:  [90 %-100 %] 98 % (06/29 1200) Weight:  [64.6 kg] 64.6 kg (06/29 0600) Last BM Date: 10/03/20 General: NAD  Intake/Output from previous day: 06/28 0701 - 06/29 0700 In: 1387.1 [I.V.:677.9; IV Piggyback:709.2] Out: 752 [Urine:751; Stool:1] Intake/Output this shift: Total I/O In: 352.6 [I.V.:120.8; IV Piggyback:231.7] Out: 495 [Urine:495]   Lab Results: Recent Labs    10/02/20 0410 10/02/20 1204 10/03/20 0437  WBC 2.6* 2.7* 2.1*  HGB 10.2* 10.2* 9.0*  PLT 87* 106* 109*  MCV 88.5 89.0 90.3   BMET Recent Labs    10/01/20 1613 10/01/20 1720 10/02/20 0410 10/03/20 0437  NA 141 143 142 146*  K 4.1 4.0 4.4 3.7  CL 112*  --  113* 116*  CO2 21*  --  19* 25  GLUCOSE 151*  --  180* 125*  BUN 52*  --  51* 36*  CREATININE 0.56*  --  0.77 0.51*  CALCIUM 7.2*  --  7.4* 7.8*   LFT Recent Labs    10/01/20 0544 10/02/20 0410 10/03/20 0437  PROT 3.4* 4.1* 4.2*  ALBUMIN 1.4* 1.9* 1.9*  AST 298* 236* 113*  ALT 214* 211* 164*  ALKPHOS 97 92 103  BILITOT 0.6 0.7 1.2   PT/INR Recent Labs    10/02/20 1204  INR 1.1      Imaging/Other results: IR Angiogram Visceral Selective  Result Date: 10/02/2020 INDICATION: 60 year old male with hematemesis and anemia status post upper endoscopy revealing active bleed from the gastric fundus. Recent CT abdomen pelvis demonstrates active extravasation in the gastric fundus. EXAM: 1. Ultrasound-guided vascular access of the right common femoral artery. 2. Selective catheterization and angiography of the celiac trunk. 3. Sub selective catheterization angiography of the  gastroepiploic artery, left gastric artery, and splenic artery. MEDICATIONS: None. ANESTHESIA/SEDATION: The patient was under general anesthesia prior to the procedure and cared for by the department of Anesthesia throughout. CONTRAST:  84mL OMNIPAQUE IOHEXOL 300 MG/ML SOLN, 51mL OMNIPAQUE IOHEXOL 300 MG/ML SOLN FLUOROSCOPY TIME:  Fluoroscopy Time: 9 minutes 42 seconds (402 mGy). COMPLICATIONS: None immediate. PROCEDURE: Informed consent was obtained from the patient following explanation of the procedure, risks, benefits and alternatives. The patient understands, agrees and consents for the procedure. All questions were addressed. A time out was performed prior to the initiation of the procedure. Maximal barrier sterile technique utilized including caps, mask, sterile gowns, sterile gloves, large sterile drape, hand hygiene, and Betadine prep. The right groin was prepped and draped in standard fashion. Preprocedure ultrasound demonstrated patency of the right common femoral artery. The procedure was planned. Subdermal Local anesthesia was provided at the planned needle entry site with 1% lidocaine. A small skin nick was made. Blunt dissection was performed in the subdermal tissues. Under direct ultrasound visualization, the right common femoral artery is punctured with a 21 gauge micropuncture needle. An ultrasound image was recorded and stored in the permanent record. A micropuncture sheath was placed. Limited right lower extremity angiogram was performed which demonstrated appropriate puncture site for arterial closure device use. 80.035 inch J wire was then inserted through the micropuncture which is exchanged for a 5 [place] vascular sheath. A C2 catheter was positioned  in the superior abdominal aorta and successfully selected the celiac trunk under fluoroscopic guidance. Celiac angiogram was performed which demonstrated normal caliber and patency of the hepatic artery, GDA into the gastroepiploic artery, and  left gastric artery. The splenic artery trunk was patent which bifurcates proximally into pancreatic branches. No active extravasation was seen on the celiac angiogram. A Progreat Omega microcatheter and 0.04 inch synchro soft guidewire were then used to select the gastroepiploic artery. Dedicated angiogram from the distal gastroepiploic artery demonstrated mild hyperemia of the greater curvature of the stomach without evidence of active extravasation. The microcatheter was then withdrawn and repositioned to the left gastric artery. Dedicated angiogram from the proximal left gastric artery demonstrated normal appearance of perfusion to the gastric fundus without evidence of active extravasation. The catheter was then withdrawn and redirected to the proximal splenic artery. Angiogram was performed which demonstrated majority flow to the pancreatic tail. The spleen is surgically absent. There is no evidence of active extravasation. Given absence of any extravasation, no embolization was performed. The catheters were removed. The right groin arteriotomy was closed with a 6 French Angio-Seal. There is no change in pulses upon completion of the procedure. The patient tolerated the procedure well and was transferred to the ICU postprocedurally. IMPRESSION: No evidence of active extravasation from celiac arterial supply with selective catheterization angiography from the gastroepiploic, left gastric, and residual splenic artery. Normalization was performed. Ruthann Cancer, MD [facility] Radiology Electronically Signed   By: Ruthann Cancer MD   On: 10/02/2020 09:51   IR Angiogram Selective Each Additional Vessel  Result Date: 10/02/2020 INDICATION: 60 year old male with hematemesis and anemia status post upper endoscopy revealing active bleed from the gastric fundus. Recent CT abdomen pelvis demonstrates active extravasation in the gastric fundus. EXAM: 1. Ultrasound-guided  vascular access of the right common femoral artery. 2. Selective catheterization and angiography of the celiac trunk. 3. Sub selective catheterization angiography of the gastroepiploic artery, left gastric artery, and splenic artery. MEDICATIONS: None. ANESTHESIA/SEDATION: The patient was under general anesthesia prior to the procedure and cared for by the department of Anesthesia throughout. CONTRAST:  34mL OMNIPAQUE IOHEXOL 300 MG/ML SOLN, 49mL OMNIPAQUE IOHEXOL 300 MG/ML SOLN FLUOROSCOPY TIME:  Fluoroscopy Time: 9 minutes 42 seconds (402 mGy). COMPLICATIONS: None immediate. PROCEDURE: Informed consent was obtained from the patient following explanation of the procedure, risks, benefits and alternatives. The patient understands, agrees and consents for the procedure. All questions were addressed. A time out was performed prior to the initiation of the procedure. Maximal barrier sterile technique utilized including caps, mask, sterile gowns, sterile gloves, large sterile drape, hand hygiene, and Betadine prep. The right groin was prepped and draped in standard fashion. Preprocedure ultrasound demonstrated patency of the right common femoral artery. The procedure was planned. Subdermal Local anesthesia was provided at the planned needle entry site with 1% lidocaine. A small skin nick was made. Blunt dissection was performed in the subdermal tissues. Under direct ultrasound visualization, the right common femoral artery is punctured with a 21 gauge micropuncture needle. An ultrasound image was recorded and stored in the permanent record. A micropuncture sheath was placed. Limited right lower extremity angiogram was performed which demonstrated appropriate puncture site for arterial closure device use. 80.035 inch J wire was then inserted through the micropuncture which is exchanged for a 5 [place] vascular sheath. A C2 catheter was positioned in the superior abdominal aorta and successfully selected the celiac trunk  under fluoroscopic guidance. Celiac angiogram was performed  which demonstrated normal caliber and patency of the hepatic artery, GDA into the gastroepiploic artery, and left gastric artery. The splenic artery trunk was patent which bifurcates proximally into pancreatic branches. No active extravasation was seen on the celiac angiogram. A Progreat Omega microcatheter and 0.04 inch synchro soft guidewire were then used to select the gastroepiploic artery. Dedicated angiogram from the distal gastroepiploic artery demonstrated mild hyperemia of the greater curvature of the stomach without evidence of active extravasation. The microcatheter was then withdrawn and repositioned to the left gastric artery. Dedicated angiogram from the proximal left gastric artery demonstrated normal appearance of perfusion to the gastric fundus without evidence of active extravasation. The catheter was then withdrawn and redirected to the proximal splenic artery. Angiogram was performed which demonstrated majority flow to the pancreatic tail. The spleen is surgically absent. There is no evidence of active extravasation. Given absence of any extravasation, no embolization was performed. The catheters were removed. The right groin arteriotomy was closed with a 6 French Angio-Seal. There is no change in pulses upon completion of the procedure. The patient tolerated the procedure well and was transferred to the ICU postprocedurally. IMPRESSION: No evidence of active extravasation from celiac arterial supply with selective catheterization angiography from the gastroepiploic, left gastric, and residual splenic artery. Normalization was performed. Ruthann Cancer, MD [facility] Radiology Electronically Signed   By: Ruthann Cancer MD   On: 10/02/2020 09:51   IR Angiogram Selective Each Additional Vessel  Result Date: 10/02/2020 INDICATION: 60 year old male with hematemesis and anemia status post  upper endoscopy revealing active bleed from the gastric fundus. Recent CT abdomen pelvis demonstrates active extravasation in the gastric fundus. EXAM: 1. Ultrasound-guided vascular access of the right common femoral artery. 2. Selective catheterization and angiography of the celiac trunk. 3. Sub selective catheterization angiography of the gastroepiploic artery, left gastric artery, and splenic artery. MEDICATIONS: None. ANESTHESIA/SEDATION: The patient was under general anesthesia prior to the procedure and cared for by the department of Anesthesia throughout. CONTRAST:  55mL OMNIPAQUE IOHEXOL 300 MG/ML SOLN, 61mL OMNIPAQUE IOHEXOL 300 MG/ML SOLN FLUOROSCOPY TIME:  Fluoroscopy Time: 9 minutes 42 seconds (402 mGy). COMPLICATIONS: None immediate. PROCEDURE: Informed consent was obtained from the patient following explanation of the procedure, risks, benefits and alternatives. The patient understands, agrees and consents for the procedure. All questions were addressed. A time out was performed prior to the initiation of the procedure. Maximal barrier sterile technique utilized including caps, mask, sterile gowns, sterile gloves, large sterile drape, hand hygiene, and Betadine prep. The right groin was prepped and draped in standard fashion. Preprocedure ultrasound demonstrated patency of the right common femoral artery. The procedure was planned. Subdermal Local anesthesia was provided at the planned needle entry site with 1% lidocaine. A small skin nick was made. Blunt dissection was performed in the subdermal tissues. Under direct ultrasound visualization, the right common femoral artery is punctured with a 21 gauge micropuncture needle. An ultrasound image was recorded and stored in the permanent record. A micropuncture sheath was placed. Limited right lower extremity angiogram was performed which demonstrated appropriate puncture site for arterial closure device use. 80.035 inch J wire was then inserted through  the micropuncture which is exchanged for a 5 [place] vascular sheath. A C2 catheter was positioned in the superior abdominal aorta and successfully selected the celiac trunk under fluoroscopic guidance. Celiac angiogram was performed which demonstrated normal caliber and patency of the hepatic artery, GDA into the gastroepiploic artery, and left gastric artery.  The splenic artery trunk was patent which bifurcates proximally into pancreatic branches. No active extravasation was seen on the celiac angiogram. A Progreat Omega microcatheter and 0.04 inch synchro soft guidewire were then used to select the gastroepiploic artery. Dedicated angiogram from the distal gastroepiploic artery demonstrated mild hyperemia of the greater curvature of the stomach without evidence of active extravasation. The microcatheter was then withdrawn and repositioned to the left gastric artery. Dedicated angiogram from the proximal left gastric artery demonstrated normal appearance of perfusion to the gastric fundus without evidence of active extravasation. The catheter was then withdrawn and redirected to the proximal splenic artery. Angiogram was performed which demonstrated majority flow to the pancreatic tail. The spleen is surgically absent. There is no evidence of active extravasation. Given absence of any extravasation, no embolization was performed. The catheters were removed. The right groin arteriotomy was closed with a 6 French Angio-Seal. There is no change in pulses upon completion of the procedure. The patient tolerated the procedure well and was transferred to the ICU postprocedurally. IMPRESSION: No evidence of active extravasation from celiac arterial supply with selective catheterization angiography from the gastroepiploic, left gastric, and residual splenic artery. Normalization was performed. Ruthann Cancer, MD [facility] Radiology Electronically Signed   By: Ruthann Cancer MD    On: 10/02/2020 09:51   IR US Guide Vasc Access Right  Result Date: 10/02/2020 INDICATION: 60 year old male with hematemesis and anemia status post upper endoscopy revealing active bleed from the gastric fundus. Recent CT abdomen pelvis demonstrates active extravasation in the gastric fundus. EXAM: 1. Ultrasound-guided vascular access of the right common femoral artery. 2. Selective catheterization and angiography of the celiac trunk. 3. Sub selective catheterization angiography of the gastroepiploic artery, left gastric artery, and splenic artery. MEDICATIONS: None. ANESTHESIA/SEDATION: The patient was under general anesthesia prior to the procedure and cared for by the department of Anesthesia throughout. CONTRAST:  21mL OMNIPAQUE IOHEXOL 300 MG/ML SOLN, 19mL OMNIPAQUE IOHEXOL 300 MG/ML SOLN FLUOROSCOPY TIME:  Fluoroscopy Time: 9 minutes 42 seconds (402 mGy). COMPLICATIONS: None immediate. PROCEDURE: Informed consent was obtained from the patient following explanation of the procedure, risks, benefits and alternatives. The patient understands, agrees and consents for the procedure. All questions were addressed. A time out was performed prior to the initiation of the procedure. Maximal barrier sterile technique utilized including caps, mask, sterile gowns, sterile gloves, large sterile drape, hand hygiene, and Betadine prep. The right groin was prepped and draped in standard fashion. Preprocedure ultrasound demonstrated patency of the right common femoral artery. The procedure was planned. Subdermal Local anesthesia was provided at the planned needle entry site with 1% lidocaine. A small skin nick was made. Blunt dissection was performed in the subdermal tissues. Under direct ultrasound visualization, the right common femoral artery is punctured with a 21 gauge micropuncture needle. An ultrasound image was recorded and stored in the permanent record. A micropuncture sheath was placed. Limited right lower  extremity angiogram was performed which demonstrated appropriate puncture site for arterial closure device use. 80.035 inch J wire was then inserted through the micropuncture which is exchanged for a 5 [place] vascular sheath. A C2 catheter was positioned in the superior abdominal aorta and successfully selected the celiac trunk under fluoroscopic guidance. Celiac angiogram was performed which demonstrated normal caliber and patency of the hepatic artery, GDA into the gastroepiploic artery, and left gastric artery. The splenic artery trunk was patent which bifurcates proximally into pancreatic branches. No active extravasation was seen on the  celiac angiogram. A Progreat Omega microcatheter and 0.04 inch synchro soft guidewire were then used to select the gastroepiploic artery. Dedicated angiogram from the distal gastroepiploic artery demonstrated mild hyperemia of the greater curvature of the stomach without evidence of active extravasation. The microcatheter was then withdrawn and repositioned to the left gastric artery. Dedicated angiogram from the proximal left gastric artery demonstrated normal appearance of perfusion to the gastric fundus without evidence of active extravasation. The catheter was then withdrawn and redirected to the proximal splenic artery. Angiogram was performed which demonstrated majority flow to the pancreatic tail. The spleen is surgically absent. There is no evidence of active extravasation. Given absence of any extravasation, no embolization was performed. The catheters were removed. The right groin arteriotomy was closed with a 6 French Angio-Seal. There is no change in pulses upon completion of the procedure. The patient tolerated the procedure well and was transferred to the ICU postprocedurally. IMPRESSION: No evidence of active extravasation from celiac arterial supply with selective catheterization angiography from the gastroepiploic, left gastric, and residual splenic artery.  Normalization was performed. Ruthann Cancer, MD [facility] Radiology Electronically Signed   By: Ruthann Cancer MD   On: 10/02/2020 09:51   DG CHEST PORT 1 VIEW  Result Date: 10/01/2020 CLINICAL DATA:  Intubation. EXAM: PORTABLE CHEST 1 VIEW COMPARISON:  September 30, 2020. FINDINGS: The heart size and mediastinal contours are within normal limits. Endotracheal tube appears to be in good position. Right internal jugular catheter is noted with tip in expected position of the SVC. Right upper lobe airspace opacity is noted concerning for pneumonia. The visualized skeletal structures are unremarkable. IMPRESSION: Endotracheal tube and right internal jugular catheter in grossly good position. Stable right lung opacity. Electronically Signed   By: Marijo Conception M.D.   On: 10/01/2020 16:31   US LIVER DOPPLER  Result Date: 10/02/2020 CLINICAL DATA:  60 year old male with upper gastrointestinal hemorrhage. Evaluate for presence cirrhosis and signs of portal hypertension. EXAM: DUPLEX ULTRASOUND OF LIVER TECHNIQUE: Color and duplex Doppler ultrasound was performed to evaluate the hepatic in-flow and out-flow vessels. COMPARISON:  09/30/2020 FINDINGS: Liver: Normal parenchymal echogenicity. Micro nodularity about the surface contour of the liver. No focal lesion, mass or intrahepatic biliary ductal dilatation. Main Portal Vein size: 1.0 cm Portal Vein Velocities Main Prox:  23 cm/sec, antegrade Main Mid: 21 cm/sec, antegrade Main Dist:  24 cm/sec, antegrade Right: 20 cm/sec, antegrade Left: 12 cm/sec, antegrade Hepatic Vein Velocities Right:  23 cm/sec Middle:  31 cm/sec Left:  24 cm/sec IVC: Present and patent with normal respiratory phasicity. Hepatic Artery Velocity:  44 cm/sec Splenic Vein Velocity:  20 cm/sec Spleen: Surgically absent. Portal Vein Occlusion/Thrombus: No Splenic Vein Occlusion/Thrombus: No Ascites: Trace right perinephric fluid. Varices: None IMPRESSION:  1. Mild nodular hepatic contour about in otherwise normal appearing hepatic parenchyma as could be seen with early hepatic cirrhosis. 2. Patent visualized portal system with antegrade flow. Ruthann Cancer, MD [facility] Radiology Electronically Signed   By: Ruthann Cancer MD   On: 10/02/2020 11:35   CT Angio Abd/Pel w/ and/or w/o  Result Date: 10/03/2020 CLINICAL DATA:  60 year old male with GI bleeding, previous negative mesenteric angiogram with continued melena EXAM: CTA ABDOMEN AND PELVIS WITHOUT AND WITH CONTRAST TECHNIQUE: Multidetector CT imaging of the abdomen and pelvis was performed using the standard protocol during bolus administration of intravenous contrast. Multiplanar reconstructed images and MIPs were obtained and reviewed to evaluate the vascular  anatomy. CONTRAST:  161mL OMNIPAQUE IOHEXOL 350 MG/ML SOLN COMPARISON:  09/30/2020 FINDINGS: VASCULAR Aorta: Diameter of the distal thoracic aorta at the hiatus measures 2.1 cm. Atherosclerotic changes of the abdominal aorta without pedunculated plaque or ulcerated plaque. No dissection. Greatest diameter of the infrarenal abdominal aorta 3.1 cm, unchanged. Celiac: Celiac artery patent. Minimal atherosclerosis. Surgical ligation of the splenic artery. Left gastric artery patent. Hepatic artery patent with branches patent. SMA: Atherosclerotic changes at the SMA origin with mixed calcified and soft plaque. Branches are patent. Renals: - Right: Single right renal artery with minimal atherosclerosis at the origin. Normal course caliber and contour. - Left: Single left renal artery with minimal atherosclerosis. No high-grade stenosis. Normal course caliber and contour. IMA: IMA is patent. Right lower extremity: Unremarkable course, caliber, and contour of the right iliac system. No aneurysm, dissection, or occlusion. Mild atherosclerosis without high-grade stenosis or occlusion. Hypogastric artery is patent.  Common femoral artery patent. Proximal SFA and profunda femoris patent. Left lower extremity: Unremarkable course, caliber, and contour of the left iliac system. No aneurysm, dissection, or occlusion. Mild atherosclerosis with no high-grade stenosis or occlusion. Hypogastric artery is patent. Common femoral artery patent. Proximal SFA and profunda femoris patent. Veins: Portal: Portal vein is patent. Left and right portal veins patent. Superior mesenteric vein and inferior mesenteric vein are patent. No calcifications, filling defects, or stenosis. There are no enlarged gastric varices or esophageal varices identified. Systemic: Unremarkable IVC, iliac veins, proximal femoral veins. Bilateral renal veins patent, unremarkable with no accessory renal veins identified. No portosystemic shunt identified. Review of the MIP images confirms the above findings. NON-VASCULAR Lower chest: Small bilateral pleural effusions. Emphysematous changes at the lung bases. Motion artifact somewhat limits evaluation. Scarring/atelectasis present, as was previous. Hepatobiliary: Relatively unremarkable appearance of liver. Slight heterogeneous enhancement profile of the posterior right liver, which was present on the comparison, likely perfusion phenomenon. No focal lesion identified. Partially calcified material within the gallbladder lumen compatible with cholelithiasis. No inflammatory changes. Pancreas: Unremarkable. Spleen: Splenectomy Adrenals/Urinary Tract: - Right adrenal gland: Unremarkable - Left adrenal gland: Unremarkable. - Right kidney: No hydronephrosis, nephrolithiasis, inflammation, or ureteral dilation. No focal lesion. - Left Kidney: No hydronephrosis, nephrolithiasis, inflammation, or ureteral dilation. No focal lesion. - Urinary Bladder: Unremarkable. Stomach/Bowel: - Stomach: Surgical clips within the gastric cardia. No extravasation of contrast or pooling of contrast. Otherwise unremarkable stomach. - Small bowel:  Small bowel unremarkable. - Appendix: Normal. - Colon: No inflammatory changes of the colon. No significant stool burden. No extravasation of contrast/pooling of contrast. Diverticular disease within the left colon with no acute inflammatory changes. Fluid within the distal sigmoid colon and within the rectum, nonspecific. Lymphatic: No adenopathy. Mesenteric: No free fluid or air. No mesenteric adenopathy. Reproductive: Unremarkable prostate Other: Fat containing left inguinal hernia. Right inguinal hernia containing mostly fat. There is a wall of short segment small bowel which enters the base of the hernia sac of the right inguinal hernia. Surgical changes along the midline abdomen with Richter type hernia involving transverse colon in the subxiphoid region as well as transverse colon and small bowel near the umbilicus. No evidence of entrapment of small bowel or colon. Edema within the body wall and mesentery compatible with anasarca. Musculoskeletal: No acute displaced fracture. Degenerative changes of the spine. No bony canal narrowing. Degenerative changes of the hips. IMPRESSION: CT angiogram is negative for gastrointestinal hemorrhage. Negative for overt signs of portal hypertension, without esophageal varices, gastric varices, or overt portosystemic shunt. Evidence of fluid positive  state with body wall/mesenteric edema/anasarca and small bilateral pleural effusions. Aortic atherosclerosis and redemonstration infrarenal abdominal aortic aneurysm measuring 3.1 cm. Aortic Atherosclerosis (ICD10-I70.0). Aortic aneurysm NOS (ICD10-I71.9). Recommend follow-up every 3 years. This recommendation follows ACR consensus guidelines: White Paper of the ACR Incidental Findings Committee II on Vascular Findings. J Am Coll Radiol 2013; 10:789-794. Additional ancillary findings as above. Signed, Dulcy Fanny. Dellia Nims, RPVI [facility] Radiology Electronically Signed   By:  Corrie Mckusick D.O.   On: 10/03/2020 11:06      Assessment &Plan  60 yr old male with history of rheumatoid arthritis, cavitary right upper lung pneumonia admitted with acute GI bleed  Large-volume GI hemorrhage.  S/p EGD 6/27 suspicious for hemorrhage from isolated gastric varices, hemostatic clipsX2 were placed given persistent oozing patient was sent to IR for possible embolization There was no active extravasation  No further bleeding, hemoglobin stable Repeat CT angio this morning was negative for any active extravasation, hemostatic clips placed and gastric fundus/cardia are intact No large esophageal or gastric varices   Liver Doppler ultrasound showed micro nodularity on surface consistent with early cirrhosis He does have thrombocytopenia, low albumin and elevated INR to support the diagnosis of cirrhosis but no evidence of decompensation Unclear the underlying etiology for liver disease ?  Autoimmune/NASH Meld sodium score 7 Child Pugh class B   Discontinue octreotide gtt. after completing 72 hours Pantoprazole twice daily Advance diet as tolerated Monitor daily hemoglobin  Follow-up with outpatient GI, [facility] on discharge  GI will sign off, available if have any questions    K. Denzil Magnuson , MD [phone]  [facility]

## 2020-10-04 ENCOUNTER — Encounter (HOSPITAL_COMMUNITY): Payer: Self-pay | Admitting: Gastroenterology

## 2020-10-04 ENCOUNTER — Inpatient Hospital Stay: Payer: Medicaid Other | Admitting: Infectious Diseases

## 2020-10-04 DIAGNOSIS — R748 Abnormal levels of other serum enzymes: Secondary | ICD-10-CM

## 2020-10-04 DIAGNOSIS — E876 Hypokalemia: Secondary | ICD-10-CM

## 2020-10-04 DIAGNOSIS — R531 Weakness: Secondary | ICD-10-CM

## 2020-10-04 DIAGNOSIS — J9621 Acute and chronic respiratory failure with hypoxia: Secondary | ICD-10-CM

## 2020-10-04 DIAGNOSIS — I864 Gastric varices: Secondary | ICD-10-CM

## 2020-10-04 DIAGNOSIS — R578 Other shock: Secondary | ICD-10-CM

## 2020-10-04 DIAGNOSIS — E43 Unspecified severe protein-calorie malnutrition: Secondary | ICD-10-CM

## 2020-10-04 DIAGNOSIS — Z9081 Acquired absence of spleen: Secondary | ICD-10-CM

## 2020-10-04 LAB — COMPREHENSIVE METABOLIC PANEL
ALT: 145 U/L — ABNORMAL HIGH (ref 0–44)
AST: 68 U/L — ABNORMAL HIGH (ref 15–41)
Albumin: 2.3 g/dL — ABNORMAL LOW (ref 3.5–5.0)
Alkaline Phosphatase: 118 U/L (ref 38–126)
Anion gap: 8 (ref 5–15)
BUN: 18 mg/dL (ref 6–20)
CO2: 26 mmol/L (ref 22–32)
Calcium: 7.6 mg/dL — ABNORMAL LOW (ref 8.9–10.3)
Chloride: 106 mmol/L (ref 98–111)
Creatinine, Ser: 0.64 mg/dL (ref 0.61–1.24)
GFR, Estimated: >60 mL/min (ref >60)
Glucose, Bld: 116 mg/dL — ABNORMAL HIGH (ref 70–99)
Potassium: 3.1 mmol/L — ABNORMAL LOW (ref 3.5–5.1)
Sodium: 140 mmol/L (ref 135–145)
Total Bilirubin: 1.7 mg/dL — ABNORMAL HIGH (ref 0.3–1.2)
Total Protein: 4.7 g/dL — ABNORMAL LOW (ref 6.5–8.1)

## 2020-10-04 LAB — CBC
HCT: 29 % — ABNORMAL LOW (ref 39.0–52.0)
Hemoglobin: 9.8 g/dL — ABNORMAL LOW (ref 13.0–17.0)
MCH: 30.6 pg (ref 26.0–34.0)
MCHC: 33.8 g/dL (ref 30.0–36.0)
MCV: 90.6 fL (ref 80.0–100.0)
Platelets: 133 10*3/uL — ABNORMAL LOW (ref 150–400)
RBC: 3.2 MIL/uL — ABNORMAL LOW (ref 4.22–5.81)
RDW: 19 % — ABNORMAL HIGH (ref 11.5–15.5)
WBC: 1.7 10*3/uL — ABNORMAL LOW (ref 4.0–10.5)
nRBC: 36.8 % — ABNORMAL HIGH (ref 0.0–0.2)

## 2020-10-04 LAB — GLUCOSE, CAPILLARY
Glucose-Capillary: 109 mg/dL — ABNORMAL HIGH (ref 70–99)
Glucose-Capillary: 114 mg/dL — ABNORMAL HIGH (ref 70–99)
Glucose-Capillary: 199 mg/dL — ABNORMAL HIGH (ref 70–99)

## 2020-10-04 LAB — MAGNESIUM: Magnesium: 2 mg/dL (ref 1.7–2.4)

## 2020-10-04 LAB — PATHOLOGIST SMEAR REVIEW

## 2020-10-04 MED ORDER — DOXYCYCLINE HYCLATE 100 MG PO TABS
100.0000 mg | ORAL_TABLET | Freq: Two times a day (BID) | ORAL | Status: DC
  Administered 2020-10-04 – 2020-10-07 (×7): 100 mg via ORAL
  Filled 2020-10-04 (×7): qty 1

## 2020-10-04 MED ORDER — PREDNISONE 10 MG PO TABS
10.0000 mg | ORAL_TABLET | Freq: Two times a day (BID) | ORAL | Status: DC
  Administered 2020-10-04 – 2020-10-07 (×8): 10 mg via ORAL
  Filled 2020-10-04 (×8): qty 1

## 2020-10-04 MED ORDER — PREDNISONE 10 MG PO TABS: 10.0000 mg | ORAL_TABLET | Freq: Every day | ORAL | Status: DC

## 2020-10-04 MED ORDER — METRONIDAZOLE 500 MG PO TABS
500.0000 mg | ORAL_TABLET | Freq: Three times a day (TID) | ORAL | Status: DC
  Administered 2020-10-04 – 2020-10-07 (×10): 500 mg via ORAL
  Filled 2020-10-04 (×10): qty 1

## 2020-10-04 MED ORDER — POTASSIUM CHLORIDE CRYS ER 20 MEQ PO TBCR
40.0000 meq | EXTENDED_RELEASE_TABLET | ORAL | Status: AC
  Administered 2020-10-04 (×2): 40 meq via ORAL
  Filled 2020-10-04 (×2): qty 2

## 2020-10-04 MED ORDER — PANTOPRAZOLE SODIUM 40 MG PO TBEC
40.0000 mg | DELAYED_RELEASE_TABLET | Freq: Two times a day (BID) | ORAL | Status: DC
  Administered 2020-10-04 – 2020-10-07 (×7): 40 mg via ORAL
  Filled 2020-10-04 (×7): qty 1

## 2020-10-04 NOTE — Progress Notes (Signed)
[facility]  Date of Admission:  09/30/2020     Total days of antibiotics 14         ASSESSMENT:  Mr. Saindon bronchoscopy cultures and pathology remain pending. Will complete course of ceftriaxone on 7/3 followed by 2 days of Keflex for treatment of Klebsiella bacteremia and change vancomycin to doxycycline with goal to continue previous treatment course for suspected MRSA cavitary lesion. Will arrange follow up with Dr. Delaine Lame near completion of treatment. Continue management of GI bleed per primary team and gastroenterology. ID will sign off. Please re-consult if needed.   PLAN:  Continue ceftriaxone through 7/3 and transition to Keflex for 2 days for Klebsiella bacteremia. Change vancomycin to doxycyline and continue for the next 3 weeks for suspected MRSA cavitary lesion. Follow up with Dr. Delaine Lame in 3 weeks. Continue management of GI bleed per primary team.   Principal Problem:   Hemoptysis Active Problems:   Felty syndrome (HCC)   Cavitary pneumonia   GI bleed   Hemorrhagic shock (HCC)   Acute blood loss anemia   Bleeding gastric varices   Bacteremia   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HCC)   Cirrhosis of liver (HCC)    arformoterol  15 mcg Nebulization BID   [START ON 10/07/2020] cephALEXin  500 mg Oral Q6H   Chlorhexidine Gluconate Cloth  6 each Topical Q2000   doxycycline  100 mg Oral Q12H   feeding supplement  237 mL Oral BID BM   metroNIDAZOLE  500 mg Oral Q8H   multivitamin with minerals  1 tablet Oral Daily   mupirocin ointment  1 application Nasal BID   nicotine  21 mg Transdermal Daily   pantoprazole  40 mg Oral BID   predniSONE  10 mg Oral BID WC   sodium chloride flush  10-40 mL Intracatheter Q12H   tranexamic acid  500 mg Nebulization Q8H    SUBJECTIVE:  Afebrile overnight with no acute events. Resting today. No new concerns/complaints.   Allergies  Allergen Reactions   Levaquin [Levofloxacin In  D5w] Other (See Comments)    Chest pain   Methotrexate Other (See Comments)    Chest pain     Review of Systems: Review of Systems  Constitutional:  Negative for chills, fever and weight loss.  Respiratory:  Negative for cough, shortness of breath and wheezing.   Cardiovascular:  Negative for chest pain and leg swelling.  Gastrointestinal:  Negative for abdominal pain, constipation, diarrhea, nausea and vomiting.  Skin:  Negative for rash.     OBJECTIVE: Vitals:   10/04/20 0800 10/04/20 0803 10/04/20 0900 10/04/20 1000  BP:   (!) 157/77 (!) 156/83  Pulse: 73 67 (!) 53 70  Resp: (!) 24 (!) 23 20 (!) 22  Temp:      TempSrc:      SpO2: 99% 97% 100% 100%  Weight:      Height:       Body mass index is 22.17 kg/m.  Physical Exam Constitutional:      General: He is not in acute distress.    Appearance: He is well-developed.     Comments: Sleeping on initial entry; pleasant.   Cardiovascular:     Rate and Rhythm: Normal rate and regular rhythm.     Heart sounds: Normal heart sounds.  Pulmonary:     Effort: Pulmonary effort is normal.     Breath sounds: Normal breath sounds.  Abdominal:     General: Bowel  sounds are normal.  Skin:    General: Skin is warm and dry.  Neurological:     Mental Status: He is alert and oriented to person, place, and time.  Psychiatric:        Mood and Affect: Mood normal.    Lab Results Lab Results  Component Value Date   WBC 1.7 (L) 10/04/2020   HGB 9.8 (L) 10/04/2020   HCT 29.0 (L) 10/04/2020   MCV 90.6 10/04/2020   PLT 133 (L) 10/04/2020    Lab Results  Component Value Date   CREATININE 0.64 10/04/2020   BUN 18 10/04/2020   NA 140 10/04/2020   K 3.1 (L) 10/04/2020   CL 106 10/04/2020   CO2 26 10/04/2020    Lab Results  Component Value Date   ALT 145 (H) 10/04/2020   AST 68 (H) 10/04/2020   ALKPHOS 118 10/04/2020   BILITOT 1.7 (H) 10/04/2020     Microbiology: Recent Results (from the past 240 hour(s))  Aspergillus  Ag, BAL/Serum     Status: None   Collection Time: 09/25/20 10:54 AM  Result Value Ref Range Status   Aspergillus Ag, BAL/Serum 0.04 0.00 - 0.49 Index Final    Comment: (NOTE) Performed At: [facility] [address] [place], [place] [id number] Rush Farmer MD OI:[id number]   Blood culture (routine x 2)     Status: None (Preliminary result)   Collection Time: 09/30/20  9:40 AM   Specimen: BLOOD  Result Value Ref Range Status   Specimen Description BLOOD LEFT HAND  Final   Special Requests   Final    BOTTLES DRAWN AEROBIC AND ANAEROBIC Blood Culture adequate volume   Culture   Final    NO GROWTH 4 DAYS Performed at [facility], [address] [place], [place] [address]    Report Status PENDING  Incomplete  Blood culture (routine x 2)     Status: Abnormal   Collection Time: 09/30/20  9:40 AM   Specimen: BLOOD  Result Value Ref Range Status   Specimen Description   Final    BLOOD RIGHT UPPER HAND Performed at [facility], [address] [place], [place] [address]    Special Requests   Final    BOTTLES DRAWN AEROBIC AND ANAEROBIC Blood Culture adequate volume Performed at [facility], [address] [place], [place] [address]    Culture  Setup Time   Final    GRAM NEGATIVE RODS ANAEROBIC BOTTLE ONLY CRITICAL RESULT CALLED TO, READ BACK BY AND VERIFIED WITHDayton Bailiff ROBINS @ [id number] 09/30/2020 LFD Performed at [facility], [place] [address] [place], [place] [address]    Culture KLEBSIELLA PNEUMONIAE (A)  Final   Report Status 10/03/2020 FINAL  Final   Organism ID, Bacteria KLEBSIELLA PNEUMONIAE  Final      Susceptibility   Klebsiella pneumoniae - MIC*    AMPICILLIN >=32 RESISTANT Resistant     CEFAZOLIN <=4 SENSITIVE Sensitive     CEFEPIME <=0.12 SENSITIVE Sensitive     CEFTAZIDIME <=1 SENSITIVE Sensitive     CEFTRIAXONE <=0.25 SENSITIVE Sensitive     CIPROFLOXACIN <=0.25 SENSITIVE Sensitive     GENTAMICIN <=1 SENSITIVE  Sensitive     IMIPENEM <=0.25 SENSITIVE Sensitive     TRIMETH/SULFA <=20 SENSITIVE Sensitive     AMPICILLIN/SULBACTAM 16 INTERMEDIATE Intermediate     PIP/TAZO 16 SENSITIVE Sensitive     * KLEBSIELLA PNEUMONIAE  Blood Culture ID Panel (Reflexed)     Status: Abnormal  Collection Time: 09/30/20  9:40 AM  Result Value Ref Range Status   Enterococcus faecalis NOT DETECTED NOT DETECTED Final   Enterococcus Faecium NOT DETECTED NOT DETECTED Final   Listeria monocytogenes NOT DETECTED NOT DETECTED Final   Staphylococcus species NOT DETECTED NOT DETECTED Final   Staphylococcus aureus (BCID) NOT DETECTED NOT DETECTED Final   Staphylococcus epidermidis NOT DETECTED NOT DETECTED Final   Staphylococcus lugdunensis NOT DETECTED NOT DETECTED Final   Streptococcus species NOT DETECTED NOT DETECTED Final   Streptococcus agalactiae NOT DETECTED NOT DETECTED Final   Streptococcus pneumoniae NOT DETECTED NOT DETECTED Final   Streptococcus pyogenes NOT DETECTED NOT DETECTED Final   A.calcoaceticus-baumannii NOT DETECTED NOT DETECTED Final   Bacteroides fragilis NOT DETECTED NOT DETECTED Final   Enterobacterales DETECTED (A) NOT DETECTED Final    Comment: Enterobacterales represent a large order of gram negative bacteria, not a single organism. CRITICAL RESULT CALLED TO, READ BACK BY AND VERIFIED WITH: JASON ROBINS @ 6734 09/30/20 LFD    Enterobacter cloacae complex NOT DETECTED NOT DETECTED Final   Escherichia coli NOT DETECTED NOT DETECTED Final   Klebsiella aerogenes NOT DETECTED NOT DETECTED Final   Klebsiella oxytoca NOT DETECTED NOT DETECTED Final   Klebsiella pneumoniae DETECTED (A) NOT DETECTED Final    Comment: CRITICAL RESULT CALLED TO, READ BACK BY AND VERIFIED WITH: JASON ROBINS _0  09/30/2020 LFD    Proteus species NOT DETECTED NOT DETECTED Final   Salmonella species NOT DETECTED NOT DETECTED Final   Serratia marcescens NOT DETECTED NOT DETECTED Final   Haemophilus influenzae NOT  DETECTED NOT DETECTED Final   Neisseria meningitidis NOT DETECTED NOT DETECTED Final   Pseudomonas aeruginosa NOT DETECTED NOT DETECTED Final   Stenotrophomonas maltophilia NOT DETECTED NOT DETECTED Final   Candida albicans NOT DETECTED NOT DETECTED Final   Candida auris NOT DETECTED NOT DETECTED Final   Candida glabrata NOT DETECTED NOT DETECTED Final   Candida krusei NOT DETECTED NOT DETECTED Final   Candida parapsilosis NOT DETECTED NOT DETECTED Final   Candida tropicalis NOT DETECTED NOT DETECTED Final   Cryptococcus neoformans/gattii NOT DETECTED NOT DETECTED Final   CTX-M ESBL NOT DETECTED NOT DETECTED Final   Carbapenem resistance IMP NOT DETECTED NOT DETECTED Final   Carbapenem resistance KPC NOT DETECTED NOT DETECTED Final   Carbapenem resistance NDM NOT DETECTED NOT DETECTED Final   Carbapenem resist OXA 48 LIKE NOT DETECTED NOT DETECTED Final   Carbapenem resistance VIM NOT DETECTED NOT DETECTED Final    Comment: Performed at [facility], [address] [place], [place] [address]  Resp Panel by RT-PCR (Flu A&B, Covid) Nasopharyngeal Swab     Status: None   Collection Time: 09/30/20 10:16 AM   Specimen: Nasopharyngeal Swab; Nasopharyngeal(NP) swabs in vial transport medium  Result Value Ref Range Status   SARS Coronavirus 2 by RT PCR NEGATIVE NEGATIVE Final    Comment: (NOTE) SARS-CoV-2 target nucleic acids are NOT DETECTED.  The SARS-CoV-2 RNA is generally detectable in upper respiratory specimens during the acute phase of infection. The lowest concentration of SARS-CoV-2 viral copies this assay can detect is 138 copies/mL. A negative result does not preclude SARS-Cov-2 infection and should not be used as the sole basis for treatment or other patient management decisions. A negative result may occur with  improper specimen collection/handling, submission of specimen other than nasopharyngeal swab, presence of viral mutation(s) within the areas targeted by this  assay, and inadequate number of viral copies(<138 copies/mL). A negative result must be  combined with clinical observations, patient history, and epidemiological information. The expected result is Negative.  Fact Sheet for Patients:  EntrepreneurPulse.com.au  Fact Sheet for Healthcare Providers:  IncredibleEmployment.be  This test is no t yet approved or cleared by the [place] FDA and  has been authorized for detection and/or diagnosis of SARS-CoV-2 by FDA under an Emergency Use Authorization (EUA). This EUA will remain  in effect (meaning this test can be used) for the duration of the COVID-19 declaration under Section 564(b)(1) of the Act, 21 U.S.C.section 360bbb-3(b)(1), unless the authorization is terminated  or revoked sooner.       Influenza A by PCR NEGATIVE NEGATIVE Final   Influenza B by PCR NEGATIVE NEGATIVE Final    Comment: (NOTE) The Xpert Xpress SARS-CoV-2/FLU/RSV plus assay is intended as an aid in the diagnosis of influenza from Nasopharyngeal swab specimens and should not be used as a sole basis for treatment. Nasal washings and aspirates are unacceptable for Xpert Xpress SARS-CoV-2/FLU/RSV testing.  Fact Sheet for Patients: EntrepreneurPulse.com.au  Fact Sheet for Healthcare Providers: IncredibleEmployment.be  This test is not yet approved or cleared by the [place] FDA and has been authorized for detection and/or diagnosis of SARS-CoV-2 by FDA under an Emergency Use Authorization (EUA). This EUA will remain in effect (meaning this test can be used) for the duration of the COVID-19 declaration under Section 564(b)(1) of the Act, 21 U.S.C. section 360bbb-3(b)(1), unless the authorization is terminated or revoked.  Performed at [facility], [address] [place], [place] [address]   MRSA Next Gen by PCR, Nasal     Status: Abnormal   Collection Time: 10/01/20   5:21 PM   Specimen: Nasal Mucosa; Nasal Swab  Result Value Ref Range Status   MRSA by PCR Next Gen DETECTED (A) NOT DETECTED Final    Comment: RESULT CALLED TO, READ BACK BY AND VERIFIED WITH: Seward Carol RN [id number] 6/57/22 A BROWNING (NOTE) The GeneXpert MRSA Assay (FDA approved for NASAL specimens only), is one component of a comprehensive MRSA colonization surveillance program. It is not intended to diagnose MRSA infection nor to guide or monitor treatment for MRSA infections. Test performance is not FDA approved in patients less than 89 years old. Performed at [facility], [place] [address], [place], [place] [address]   Culture, Respiratory w Gram Stain     Status: None (Preliminary result)   Collection Time: 10/02/20 11:02 AM   Specimen: Bronchoalveolar Lavage; Respiratory  Result Value Ref Range Status   Specimen Description BRONCHIAL ALVEOLAR LAVAGE  Final   Special Requests NONE  Final   Gram Stain   Final    ABUNDANT WBC PRESENT, PREDOMINANTLY PMN FEW GRAM POSITIVE COCCI IN PAIRS IN CLUSTERS    Culture   Final    CULTURE REINCUBATED FOR BETTER GROWTH Performed at [facility], [place] [address], [place], [place] [address]    Report Status PENDING  Incomplete     Terri Piedra, NP [facility]  10/04/2020  11:54 AM

## 2020-10-04 NOTE — Progress Notes (Addendum)
PROGRESS NOTE  Marcus [name] IOE:[id number] DOB: 01/03/1961   PCP: [name], FNP  Patient is from: Home.  Lives with parents.  Ambulates independently at baseline.  DOA: 09/30/2020 LOS: 4  Chief complaints:  Found down with hemoptysis.    Brief Narrative / Interim history: 60 year old M with PMH of COPD on prednisone, RF on 2 L, RA, pancytopenia, pancytopenia, tobacco use disorder and recent hospitalization at [facility] for cavitary/necrotizing pneumonia after he presented with hemoptysis and shortness of breath for which she was discharged on Zyvox and Flagyl, brought back to [facility] ED after found down with hemoptysis.  He was admitted for hypovolemic/hemorrhagic shock in the setting of hemoptysis and GI bleed.  Hgb was 8.0 (from 10.62 days prior).  SBP 64.  HR in 120s.  He was resuscitated with 2 units and IV fluid.  Hemoptysis slowed down.  He was transferred to [facility] for pulmonary, CVTS and IR evaluation.  At [facility], he had multiple blood transfusions and 2 units of FFP.  He also received octreotide infusion.  GI consulted.  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  There was no bleeding vessel noted on angiogram for embolization.  He remained intubated after EGD and extubated on 6/28.  He remained stable and transferred to [facility] hospitalist service on 6/30.  Hospital course noteworthy of Klebsiella pneumonia bacteremia.  ID gave recommendation and signed off.   Subjective: Seen and examined earlier this morning.  No major events overnight of this morning.  Has no complaints either.  He said he had a red-brown loose stool this morning.  Per RN, the stool looked dark but no bright red blood.  He denies further hemoptysis.  Denies shortness of breath, cough, chest pain, nausea, vomiting or UTI symptoms.  Objective: Vitals:   10/04/20 0800 10/04/20 0803 10/04/20 0900 10/04/20 1000  BP:   (!) 157/77 (!) 156/83  Pulse: 73 67  (!) 53 70  Resp: (!) 24 (!) 23 20 (!) 22  Temp:      TempSrc:      SpO2: 99% 97% 100% 100%  Weight:      Height:        Intake/Output Summary (Last 24 hours) at 10/04/2020 1214 Last data filed at 10/04/2020 1131 Gross per 24 hour  Intake 1784.35 ml  Output 3645 ml  Net -1860.65 ml   Filed Weights   10/02/20 0115 10/03/20 0600 10/03/20 2000  Weight: 64.6 kg 64.6 kg 68.1 kg    Examination:  GENERAL: No apparent distress.  Nontoxic. HEENT: MMM.  Vision and hearing grossly intact.  NECK: Supple.  No apparent JVD.  RESP: 97% on 2 L.  No IWOB.  Diminished aeration bilaterally. CVS:  RRR. Heart sounds normal.  ABD/GI/GU: BS+. Abd soft, NTND.  MSK/EXT:  Moves extremities. No apparent deformity. No edema.  SKIN: no apparent skin lesion or wound NEURO: Awake, alert and oriented appropriately.  No apparent focal neuro deficit. PSYCH: Calm. Normal affect.   Procedures:  6/27-EGD with concern for Grade D esophagitis, type 1 gastric varices s/p clipping. 6/27-6/28-ETT  Microbiology summarized: MRSA PCR screen positive. Blood culture at [facility] with Klebsiella pneumonia BAL pending.  Assessment & Plan: Acute Blood Loss Anemia due to hemoptysis, GI bleed and thrombocytopenia:  Hypovolemic/hemorrhagic shock-due to hemoptysis and GI bleed.  Resolved Hgb nadir at 5.1 but stable after blood transfusions. No further hemoptysis.  Still with some melena. Seems she has received  about 10 units of blood products on 6/27.   Recent Labs    10/01/20 1001 10/01/20 1129 10/01/20 1520 10/01/20 1613 10/01/20 1720 10/01/20 2338 10/02/20 0410 10/02/20 1204 10/03/20 0437 10/04/20 0539  HGB 6.3* 5.1* 10.2* 11.7* 9.9* 10.4* 10.2* 10.2* 9.0* 9.8*  -Monitor H&H-stable. -Continue Protonix -SCD for VTE prophylaxis. 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 Liver  cirrhosis/elevated liver enzymes-LFT improving. -On chronic prednisone for rheumatoid arthritis -Continue Protonix twice daily -Off octreotide -Needs pneumococcal vaccine in August 2022 / or before discharge -Appreciate help by GI.   Acute on chronic hemoptysis likely due to RUL Cavitary pneumonia/lesion, possible Mycetoma. Recent MRSA /Prevotella in Lungs, Klebsiella PNA Bacteremia Acute on Chronic Hypoxemic Respiratory Failure-on 2 L at baseline.  Resolved. Tobacco Abuse -Continue monitoring hemoptysis. -Continue continue tranexamic acid nebulizers Addendum -Appreciate recommendation by ID: Continue CTX through 7/3 and transition to Keflex for 2 days for Klebsiella bacteremia. Change vancomycin to doxycyline for the next 3 weeks for suspected MRSA cavitary lesion. Follow up with Dr. Delaine Lame in 3 weeks. -Follow up fungitell (repeat)  -Encourage tobacco cessation. -Nicotine patch   COPD without Exacerbation -On low-dose prednisone. -Continue brovana BID, dulera PRN -Hold home Dulera    RA: Stable. -Changed stress dose steroids to prednisone  Hypokalemia: K3.1. -K-Dur 40 mill equivalent x1    Felty Syndrome  Pancytopenia: Improving. -follow up with [facility] post discharge  Debility/physical deconditioning -PT/OT eval   Severe malnutrition Body mass index is 22.17 kg/m. Nutrition Problem: Severe Malnutrition Etiology: chronic illness Signs/Symptoms: percent weight loss, severe fat depletion, severe muscle depletion Interventions: Ensure Enlive (each supplement provides 350kcal and 20 grams of protein), Magic cup, MVI   DVT prophylaxis:  SCDs Start: 10/01/20 0006  Code Status: Full code Family Communication: Patient and/or RN. Available if any question.  Level of care: Med-Surg Status is: Inpatient  Remains inpatient appropriate because:Persistent severe electrolyte disturbances, IV treatments appropriate due to intensity of illness or inability to  take PO, and Inpatient level of care appropriate due to severity of illness  Dispo: The patient is from: Home              Anticipated d/c is to:  To be determined              Patient currently is not medically stable to d/c.   Difficult to place patient No       Consultants:  PCCM Gastroenterology Interventional radiology   Sch Meds:  Scheduled Meds:  arformoterol  15 mcg Nebulization BID   [START ON 10/07/2020] cephALEXin  500 mg Oral Q6H   Chlorhexidine Gluconate Cloth  6 each Topical Q2000   doxycycline  100 mg Oral Q12H   feeding supplement  237 mL Oral BID BM   metroNIDAZOLE  500 mg Oral Q8H   multivitamin with minerals  1 tablet Oral Daily   mupirocin ointment  1 application Nasal BID   nicotine  21 mg Transdermal Daily   pantoprazole  40 mg Oral BID   predniSONE  10 mg Oral BID WC   sodium chloride flush  10-40 mL Intracatheter Q12H   tranexamic acid  500 mg Nebulization Q8H   Continuous Infusions:  sodium chloride 20 mL/hr at 10/02/20 0300   cefTRIAXone (ROCEPHIN)  IV Stopped (10/04/20 0612)   octreotide  (SANDOSTATIN)    IV infusion 50 mcg/hr (10/04/20 1000)   PRN  Meds:.albuterol, oxyCODONE, sodium chloride flush  Antimicrobials: Anti-infectives (From admission, onward)    Start     Dose/Rate Route Frequency Ordered Stop   10/07/20 0000  cephALEXin (KEFLEX) capsule 500 mg        500 mg Oral Every 6 hours 10/03/20 1242 10/08/20 2359   10/04/20 1400  metroNIDAZOLE (FLAGYL) tablet 500 mg        500 mg Oral Every 8 hours 10/04/20 0855 10/22/20 0559   10/04/20 1000  doxycycline (VIBRA-TABS) tablet 100 mg        100 mg Oral Every 12 hours 10/04/20 0855 10/22/20 0959   10/04/20 0600  ceFAZolin (ANCEF) IVPB 2g/100 mL premix  Status:  Discontinued        2 g 200 mL/hr over 30 Minutes Intravenous Every 8 hours 10/03/20 1107 10/03/20 1214   10/04/20 0600  cefTRIAXone (ROCEPHIN) 2 g in sodium chloride 0.9 % 100 mL IVPB        2 g 200 mL/hr over 30 Minutes  Intravenous Every 24 hours 10/03/20 1220 10/06/20 2359   10/03/20 1300  cefTRIAXone (ROCEPHIN) 2 g in sodium chloride 0.9 % 100 mL IVPB  Status:  Discontinued        2 g 200 mL/hr over 30 Minutes Intravenous Every 24 hours 10/03/20 1214 10/03/20 1220   10/02/20 0600  cefTRIAXone (ROCEPHIN) 2 g in sodium chloride 0.9 % 100 mL IVPB  Status:  Discontinued        2 g 200 mL/hr over 30 Minutes Intravenous Every 24 hours 10/01/20 0556 10/03/20 1107   10/01/20 2300  vancomycin (VANCOREADY) IVPB 750 mg/150 mL  Status:  Discontinued        750 mg 150 mL/hr over 60 Minutes Intravenous Every 12 hours 10/01/20 1052 10/04/20 0855   10/01/20 1400  metroNIDAZOLE (FLAGYL) IVPB 500 mg  Status:  Discontinued        500 mg 100 mL/hr over 60 Minutes Intravenous Every 8 hours 10/01/20 1036 10/04/20 0855   10/01/20 1200  vancomycin (VANCOREADY) IVPB 1000 mg/200 mL        1,000 mg 200 mL/hr over 60 Minutes Intravenous  Once 10/01/20 1052 10/01/20 1829   10/01/20 0645  cefTRIAXone (ROCEPHIN) 2 g in sodium chloride 0.9 % 100 mL IVPB        2 g 200 mL/hr over 30 Minutes Intravenous  Once 10/01/20 0556 10/01/20 0911   10/01/20 0400  linezolid (ZYVOX) IVPB 600 mg  Status:  Discontinued        600 mg 300 mL/hr over 60 Minutes Intravenous Every 12 hours 10/01/20 0004 10/01/20 1040   10/01/20 0100  metroNIDAZOLE (FLAGYL) tablet 500 mg  Status:  Discontinued        500 mg Oral 2 times daily 10/01/20 0004 10/01/20 1036        I have personally reviewed the following labs and images: CBC: Recent Labs  Lab 09/28/20 0518 09/30/20 0939 09/30/20 1225 09/30/20 1759 10/01/20 2338 10/02/20 0410 10/02/20 1204 10/03/20 0437 10/04/20 0539  WBC 3.5* 7.3 8.2   < > 2.4* 2.6* 2.7* 2.1* 1.7*  NEUTROABS 1.0* 4.6 5.3  --   --   --   --   --   --   HGB 10.6* 8.0* 8.9*   < > 10.4* 10.2* 10.2* 9.0* 9.8*  HCT 33.8* 25.0* 25.8*   < > 29.9* 29.3* 30.0* 26.1* 29.0*  MCV 87.3 88.7 84.9   < > 88.2 88.5 89.0 90.3 90.6  PLT 185  104* 77*   < > 85* 87* 106* 109* 133*   < > = values in this interval not displayed.   BMP &GFR Recent Labs  Lab 09/30/20 1053 10/01/20 0544 10/01/20 1129 10/01/20 1520 10/01/20 1613 10/01/20 1720 10/02/20 0410 10/03/20 0437 10/04/20 0539  NA 136 138 143   < > 141 143 142 146* 140  K 5.4* 3.8 3.9   < > 4.1 4.0 4.4 3.7 3.1*  CL 101 112* 110  --  112*  --  113* 116* 106  CO2 18* 22  --   --  21*  --  19* 25 26  GLUCOSE 94 153* 88  --  151*  --  180* 125* 116*  BUN 88* 59* 57*  --  52*  --  51* 36* 18  CREATININE 1.55* 0.64 0.60*  --  0.56*  --  0.77 0.51* 0.64  CALCIUM 7.1* 6.9*  --   --  7.2*  --  7.4* 7.8* 7.6*  MG 2.0  --   --   --   --   --   --   --  2.0   < > = values in this interval not displayed.   Estimated Creatinine Clearance: 95.8 mL/min (by C-G formula based on SCr of 0.64 mg/dL). Liver & Pancreas: Recent Labs  Lab 09/30/20 1053 10/01/20 0544 10/02/20 0410 10/03/20 0437 10/04/20 0539  AST 296* 298* 236* 113* 68*  ALT 179* 214* 211* 164* 145*  ALKPHOS 155* 97 92 103 118  BILITOT 1.0 0.6 0.7 1.2 1.7*  PROT 5.0* 3.4* 4.1* 4.2* 4.7*  ALBUMIN 2.0* 1.4* 1.9* 1.9* 2.3*   No results for input(s): LIPASE, AMYLASE in the last 168 hours. No results for input(s): AMMONIA in the last 168 hours. Diabetic: No results for input(s): HGBA1C in the last 72 hours. Recent Labs  Lab 10/03/20 1907 10/03/20 2315 10/04/20 0309 10/04/20 0724 10/04/20 1124  GLUCAP 205* 134* 109* 114* 199*   Cardiac Enzymes: No results for input(s): CKTOTAL, CKMB, CKMBINDEX, TROPONINI in the last 168 hours. No results for input(s): PROBNP in the last 8760 hours. Coagulation Profile: Recent Labs  Lab 09/30/20 0939 10/02/20 1204  INR 1.6* 1.1   Thyroid Function Tests: No results for input(s): TSH, T4TOTAL, FREET4, T3FREE, THYROIDAB in the last 72 hours. Lipid Profile: Recent Labs    10/02/20 0410  TRIG 337*   Anemia Panel: No results for input(s): VITAMINB12, FOLATE, FERRITIN,  TIBC, IRON, RETICCTPCT in the last 72 hours. Urine analysis:    Component Value Date/Time   COLORURINE YELLOW (A) 09/30/2020 1215   APPEARANCEUR HAZY (A) 09/30/2020 1215   LABSPEC 1.018 09/30/2020 1215   PHURINE 5.0 09/30/2020 1215   GLUCOSEU NEGATIVE 09/30/2020 1215   HGBUR SMALL (A) 09/30/2020 1215   BILIRUBINUR NEGATIVE 09/30/2020 1215   KETONESUR 5 (A) 09/30/2020 1215   PROTEINUR NEGATIVE 09/30/2020 1215   UROBILINOGEN 0.2 10/21/2012 1737   NITRITE NEGATIVE 09/30/2020 1215   LEUKOCYTESUR NEGATIVE 09/30/2020 1215   Sepsis Labs: Invalid input(s): PROCALCITONIN, [place]  Microbiology: Recent Results (from the past 240 hour(s))  Aspergillus Ag, BAL/Serum     Status: None   Collection Time: 09/25/20 10:54 AM  Result Value Ref Range Status   Aspergillus Ag, BAL/Serum 0.04 0.00 - 0.49 Index Final    Comment: (NOTE) Performed At: [facility] [place], [place] [id number] Rush Farmer MD HG:[id number]   Blood culture (routine x 2)     Status: None (Preliminary result)  Collection Time: 09/30/20  9:40 AM   Specimen: BLOOD  Result Value Ref Range Status   Specimen Description BLOOD LEFT HAND  Final   Special Requests   Final    BOTTLES DRAWN AEROBIC AND ANAEROBIC Blood Culture adequate volume   Culture   Final    NO GROWTH 4 DAYS Performed at [facility], [address] [place], [place] [address]    Report Status PENDING  Incomplete  Blood culture (routine x 2)     Status: Abnormal   Collection Time: 09/30/20  9:40 AM   Specimen: BLOOD  Result Value Ref Range Status   Specimen Description   Final    BLOOD RIGHT UPPER HAND Performed at [facility], [address] [place], [place] [address]    Special Requests   Final    BOTTLES DRAWN AEROBIC AND ANAEROBIC Blood Culture adequate volume Performed at [facility], [address] [place], [place] [address]    Culture  Setup Time   Final    GRAM NEGATIVE  RODS ANAEROBIC BOTTLE ONLY CRITICAL RESULT CALLED TO, READ BACK BY AND VERIFIED WITHDayton Bailiff ROBINS @ [phone] 09/30/2020 LFD Performed at [facility], [place] [address], [place], [place] [address]    Culture KLEBSIELLA PNEUMONIAE (A)  Final   Report Status 10/03/2020 FINAL  Final   Organism ID, Bacteria KLEBSIELLA PNEUMONIAE  Final      Susceptibility   Klebsiella pneumoniae - MIC*    AMPICILLIN >=32 RESISTANT Resistant     CEFAZOLIN <=4 SENSITIVE Sensitive     CEFEPIME <=0.12 SENSITIVE Sensitive     CEFTAZIDIME <=1 SENSITIVE Sensitive     CEFTRIAXONE <=0.25 SENSITIVE Sensitive     CIPROFLOXACIN <=0.25 SENSITIVE Sensitive     GENTAMICIN <=1 SENSITIVE Sensitive     IMIPENEM <=0.25 SENSITIVE Sensitive     TRIMETH/SULFA <=20 SENSITIVE Sensitive     AMPICILLIN/SULBACTAM 16 INTERMEDIATE Intermediate     PIP/TAZO 16 SENSITIVE Sensitive     * KLEBSIELLA PNEUMONIAE  Blood Culture ID Panel (Reflexed)     Status: Abnormal   Collection Time: 09/30/20  9:40 AM  Result Value Ref Range Status   Enterococcus faecalis NOT DETECTED NOT DETECTED Final   Enterococcus Faecium NOT DETECTED NOT DETECTED Final   Listeria monocytogenes NOT DETECTED NOT DETECTED Final   Staphylococcus species NOT DETECTED NOT DETECTED Final   Staphylococcus aureus (BCID) NOT DETECTED NOT DETECTED Final   Staphylococcus epidermidis NOT DETECTED NOT DETECTED Final   Staphylococcus lugdunensis NOT DETECTED NOT DETECTED Final   Streptococcus species NOT DETECTED NOT DETECTED Final   Streptococcus agalactiae NOT DETECTED NOT DETECTED Final   Streptococcus pneumoniae NOT DETECTED NOT DETECTED Final   Streptococcus pyogenes NOT DETECTED NOT DETECTED Final   A.calcoaceticus-baumannii NOT DETECTED NOT DETECTED Final   Bacteroides fragilis NOT DETECTED NOT DETECTED Final   Enterobacterales DETECTED (A) NOT DETECTED Final    Comment: Enterobacterales represent a large order of gram negative bacteria, not a single  organism. CRITICAL RESULT CALLED TO, READ BACK BY AND VERIFIED WITH: JASON ROBINS @ 9798 09/30/20 LFD    Enterobacter cloacae complex NOT DETECTED NOT DETECTED Final   Escherichia coli NOT DETECTED NOT DETECTED Final   Klebsiella aerogenes NOT DETECTED NOT DETECTED Final   Klebsiella oxytoca NOT DETECTED NOT DETECTED Final   Klebsiella pneumoniae DETECTED (A) NOT DETECTED Final    Comment: CRITICAL RESULT CALLED TO, READ BACK BY AND VERIFIED WITH: JASON ROBINS _0  09/30/2020 LFD  Proteus species NOT DETECTED NOT DETECTED Final   Salmonella species NOT DETECTED NOT DETECTED Final   Serratia marcescens NOT DETECTED NOT DETECTED Final   Haemophilus influenzae NOT DETECTED NOT DETECTED Final   Neisseria meningitidis NOT DETECTED NOT DETECTED Final   Pseudomonas aeruginosa NOT DETECTED NOT DETECTED Final   Stenotrophomonas maltophilia NOT DETECTED NOT DETECTED Final   Candida albicans NOT DETECTED NOT DETECTED Final   Candida auris NOT DETECTED NOT DETECTED Final   Candida glabrata NOT DETECTED NOT DETECTED Final   Candida krusei NOT DETECTED NOT DETECTED Final   Candida parapsilosis NOT DETECTED NOT DETECTED Final   Candida tropicalis NOT DETECTED NOT DETECTED Final   Cryptococcus neoformans/gattii NOT DETECTED NOT DETECTED Final   CTX-M ESBL NOT DETECTED NOT DETECTED Final   Carbapenem resistance IMP NOT DETECTED NOT DETECTED Final   Carbapenem resistance KPC NOT DETECTED NOT DETECTED Final   Carbapenem resistance NDM NOT DETECTED NOT DETECTED Final   Carbapenem resist OXA 48 LIKE NOT DETECTED NOT DETECTED Final   Carbapenem resistance VIM NOT DETECTED NOT DETECTED Final    Comment: Performed at [facility], [address] [place], [place] [address]  Resp Panel by RT-PCR (Flu A&B, Covid) Nasopharyngeal Swab     Status: None   Collection Time: 09/30/20 10:16 AM   Specimen: Nasopharyngeal Swab; Nasopharyngeal(NP) swabs in vial transport medium  Result Value Ref Range  Status   SARS Coronavirus 2 by RT PCR NEGATIVE NEGATIVE Final    Comment: (NOTE) SARS-CoV-2 target nucleic acids are NOT DETECTED.  The SARS-CoV-2 RNA is generally detectable in upper respiratory specimens during the acute phase of infection. The lowest concentration of SARS-CoV-2 viral copies this assay can detect is 138 copies/mL. A negative result does not preclude SARS-Cov-2 infection and should not be used as the sole basis for treatment or other patient management decisions. A negative result may occur with  improper specimen collection/handling, submission of specimen other than nasopharyngeal swab, presence of viral mutation(s) within the areas targeted by this assay, and inadequate number of viral copies(<138 copies/mL). A negative result must be combined with clinical observations, patient history, and epidemiological information. The expected result is Negative.  Fact Sheet for Patients:  EntrepreneurPulse.com.au  Fact Sheet for Healthcare Providers:  IncredibleEmployment.be  This test is no t yet approved or cleared by the [place] FDA and  has been authorized for detection and/or diagnosis of SARS-CoV-2 by FDA under an Emergency Use Authorization (EUA). This EUA will remain  in effect (meaning this test can be used) for the duration of the COVID-19 declaration under Section 564(b)(1) of the Act, 21 U.S.C.section 360bbb-3(b)(1), unless the authorization is terminated  or revoked sooner.       Influenza A by PCR NEGATIVE NEGATIVE Final   Influenza B by PCR NEGATIVE NEGATIVE Final    Comment: (NOTE) The Xpert Xpress SARS-CoV-2/FLU/RSV plus assay is intended as an aid in the diagnosis of influenza from Nasopharyngeal swab specimens and should not be used as a sole basis for treatment. Nasal washings and aspirates are unacceptable for Xpert Xpress SARS-CoV-2/FLU/RSV testing.  Fact Sheet for  Patients: EntrepreneurPulse.com.au  Fact Sheet for Healthcare Providers: IncredibleEmployment.be  This test is not yet approved or cleared by the [place] FDA and has been authorized for detection and/or diagnosis of SARS-CoV-2 by FDA under an Emergency Use Authorization (EUA). This EUA will remain in effect (meaning this test can be used) for the duration of the COVID-19 declaration under Section 564(b)(1) of the  Act, 21 U.S.C. section 360bbb-3(b)(1), unless the authorization is terminated or revoked.  Performed at [facility], [address] [place], [place] [address]   MRSA Next Gen by PCR, Nasal     Status: Abnormal   Collection Time: 10/01/20  5:21 PM   Specimen: Nasal Mucosa; Nasal Swab  Result Value Ref Range Status   MRSA by PCR Next Gen DETECTED (A) NOT DETECTED Final    Comment: RESULT CALLED TO, READ BACK BY AND VERIFIED WITH: Seward Carol RN [id number] 6/57/22 A BROWNING (NOTE) The GeneXpert MRSA Assay (FDA approved for NASAL specimens only), is one component of a comprehensive MRSA colonization surveillance program. It is not intended to diagnose MRSA infection nor to guide or monitor treatment for MRSA infections. Test performance is not FDA approved in patients less than 45 years old. Performed at [facility], [place] [address], [place], [place] [address]   Culture, Respiratory w Gram Stain     Status: None (Preliminary result)   Collection Time: 10/02/20 11:02 AM   Specimen: Bronchoalveolar Lavage; Respiratory  Result Value Ref Range Status   Specimen Description BRONCHIAL ALVEOLAR LAVAGE  Final   Special Requests NONE  Final   Gram Stain   Final    ABUNDANT WBC PRESENT, PREDOMINANTLY PMN FEW GRAM POSITIVE COCCI IN PAIRS IN CLUSTERS    Culture   Final    CULTURE REINCUBATED FOR BETTER GROWTH Performed at [facility], [place] [address] [place], [place] [address]    Report Status PENDING  Incomplete     Radiology Studies: No results found.     Daisa Stennis T. Funkley  If 7PM-7AM, please contact night-coverage [email or web address] 10/04/2020, 12:14 PM

## 2020-10-05 DIAGNOSIS — E873 Alkalosis: Secondary | ICD-10-CM

## 2020-10-05 LAB — CULTURE, BLOOD (ROUTINE X 2)
Culture: NO GROWTH
Special Requests: ADEQUATE

## 2020-10-05 LAB — BPAM RBC
Blood Product Expiration Date: 202207132359
Blood Product Expiration Date: 202207172359
Blood Product Expiration Date: 202207182359
Blood Product Expiration Date: 202207182359
Blood Product Expiration Date: 202207182359
Blood Product Expiration Date: 202207182359
Blood Product Expiration Date: 202207182359
Blood Product Expiration Date: 202207242359
Blood Product Expiration Date: 202207242359
Blood Product Expiration Date: 202207242359
Blood Product Expiration Date: 202207252359
ISSUE DATE / TIME: 202206270624
ISSUE DATE / TIME: 202206271150
ISSUE DATE / TIME: 202206271150
ISSUE DATE / TIME: 202206271150
ISSUE DATE / TIME: 202206271347
ISSUE DATE / TIME: 202206271628
ISSUE DATE / TIME: 202206271628
Unit Type and Rh: 6200
Unit Type and Rh: 6200
Unit Type and Rh: 6200
Unit Type and Rh: 6200
Unit Type and Rh: 6200
Unit Type and Rh: 6200
Unit Type and Rh: 6200
Unit Type and Rh: 6200
Unit Type and Rh: 6200
Unit Type and Rh: 6200
Unit Type and Rh: 6200

## 2020-10-05 LAB — CBC
HCT: 32.3 % — ABNORMAL LOW (ref 39.0–52.0)
Hemoglobin: 10.7 g/dL — ABNORMAL LOW (ref 13.0–17.0)
MCH: 30.4 pg (ref 26.0–34.0)
MCHC: 33.1 g/dL (ref 30.0–36.0)
MCV: 91.8 fL (ref 80.0–100.0)
Platelets: 132 10*3/uL — ABNORMAL LOW (ref 150–400)
RBC: 3.52 MIL/uL — ABNORMAL LOW (ref 4.22–5.81)
RDW: 19.3 % — ABNORMAL HIGH (ref 11.5–15.5)
WBC: 1.8 10*3/uL — ABNORMAL LOW (ref 4.0–10.5)
nRBC: 27.2 % — ABNORMAL HIGH (ref 0.0–0.2)

## 2020-10-05 LAB — TYPE AND SCREEN
ABO/RH(D): A POS
Antibody Screen: NEGATIVE
Unit division: 0
Unit division: 0
Unit division: 0
Unit division: 0
Unit division: 0
Unit division: 0
Unit division: 0
Unit division: 0
Unit division: 0
Unit division: 0
Unit division: 0

## 2020-10-05 LAB — COMPREHENSIVE METABOLIC PANEL
ALT: 136 U/L — ABNORMAL HIGH (ref 0–44)
AST: 65 U/L — ABNORMAL HIGH (ref 15–41)
Albumin: 2.6 g/dL — ABNORMAL LOW (ref 3.5–5.0)
Alkaline Phosphatase: 167 U/L — ABNORMAL HIGH (ref 38–126)
Anion gap: 7 (ref 5–15)
BUN: 13 mg/dL (ref 6–20)
CO2: 35 mmol/L — ABNORMAL HIGH (ref 22–32)
Calcium: 8 mg/dL — ABNORMAL LOW (ref 8.9–10.3)
Chloride: 96 mmol/L — ABNORMAL LOW (ref 98–111)
Creatinine, Ser: 0.56 mg/dL — ABNORMAL LOW (ref 0.61–1.24)
GFR, Estimated: >60 mL/min (ref >60)
Glucose, Bld: 113 mg/dL — ABNORMAL HIGH (ref 70–99)
Potassium: 2.9 mmol/L — ABNORMAL LOW (ref 3.5–5.1)
Sodium: 138 mmol/L (ref 135–145)
Total Bilirubin: 2.1 mg/dL — ABNORMAL HIGH (ref 0.3–1.2)
Total Protein: 5.5 g/dL — ABNORMAL LOW (ref 6.5–8.1)

## 2020-10-05 LAB — MAGNESIUM: Magnesium: 1.6 mg/dL — ABNORMAL LOW (ref 1.7–2.4)

## 2020-10-05 MED ORDER — MAGNESIUM SULFATE 2 GM/50ML IV SOLN
2.0000 g | Freq: Once | INTRAVENOUS | Status: AC
  Administered 2020-10-05: 2 g via INTRAVENOUS
  Filled 2020-10-05: qty 50

## 2020-10-05 MED ORDER — POTASSIUM CHLORIDE CRYS ER 20 MEQ PO TBCR
40.0000 meq | EXTENDED_RELEASE_TABLET | ORAL | Status: AC
  Administered 2020-10-05 (×3): 40 meq via ORAL
  Filled 2020-10-05 (×3): qty 2

## 2020-10-05 NOTE — Progress Notes (Signed)
Occupational Therapy Treatment Patient Details Name: Marcus [name] MRN: [id number] DOB: 02/01/1961 Today's Date: 10/05/2020    History of present illness 60 y.o. male presenting to ED 6/27 after being found down by his mother. Patient admitted with hemoptysis, acute GI bleed, hemorrhagic shock, cavitary PNA and acute anemia s/p EGD 6/27 and bronchoscopy 6/28. Patient with multiple recent hospital admssions with similar complains with most recent 6/14-6/24. PMHx significant for COPD on 2-3L home O2, RA, Felty syndrome, and tobacco use.   OT comments  Patient progressing toward goals. Completed LB dressing, toilet transfer and toileting/hygiene/clothing management with Min guard to supervision A and use of RW. Patient would benefit from continued acute OT services to maximize safety and independence with self-care tasks in prep for safe d/c home.    Follow Up Recommendations  Home health OT;Supervision - Intermittent    Equipment Recommendations  None recommended by OT (Patient has necessary DME.)    Recommendations for Other Services      Precautions / Restrictions Precautions Precautions: Fall Precaution Comments: Monitor HR Restrictions Weight Bearing Restrictions: No       Mobility Bed Mobility Overal bed mobility: Needs Assistance Bed Mobility: Supine to Sit     Supine to sit: Supervision     General bed mobility comments: Supervision A with increased time. No external assist required.    Transfers Overall transfer level: Needs assistance Equipment used: Rolling walker (2 wheeled) Transfers: Sit to/from [facility] Sit to Stand: Supervision;Min guard Stand pivot transfers: Supervision;Min guard       General transfer comment: Supervision A to Min guard for all transfers with use of RW.    Balance Overall balance assessment: Needs assistance Sitting-balance support: Single extremity supported;No upper extremity supported;Feet supported Sitting  balance-Leahy Scale: Good     Standing balance support: Bilateral upper extremity supported;During functional activity Standing balance-Leahy Scale: Fair Standing balance comment: Able to maintain static standing balance without UE support on RW during hygiene/clothing management. Reliant on BUE on RW for dynamic tasks.                           ADL either performed or assessed with clinical judgement   ADL Overall ADL's : Needs assistance/impaired                     Lower Body Dressing: Min guard;Sit to/from stand Lower Body Dressing Details (indicate cue type and reason): Able to thread BLE through brief and hike over hips in standing with Min guard for steadying/safety. Toilet Transfer: Designer, television/film set Details (indicate cue type and reason): To commode in bathroom with Min gaurd and RW. Toileting- Clothing Manipulation and Hygiene: Sit to/from stand;Min guard Toileting - Clothing Manipulation Details (indicate cue type and reason): Min guard for hygiene/clothing management.     Functional mobility during ADLs: Min guard;Rolling walker       Vision   Vision Assessment?: No apparent visual deficits   Perception     Praxis      Cognition Arousal/Alertness: Awake/alert Behavior During Therapy: WFL for tasks assessed/performed Overall Cognitive Status: Within Functional Limits for tasks assessed                                 General Comments: A&Ox4        Exercises     Shoulder Instructions  General Comments Brother present at bedside.    Pertinent Vitals/ Pain       Pain Assessment: No/denies pain  Home Living                                          Prior Functioning/Environment              Frequency  Min 2X/week        Progress Toward Goals  OT Goals(current goals can now be found in the care plan section)  Progress towards OT goals: Progressing toward goals  Acute  Rehab OT Goals Patient Stated Goal: To return home. OT Goal Formulation: With patient Time For Goal Achievement: 10/17/20 Potential to Achieve Goals: Good ADL Goals Pt Will Perform Grooming: Independently;standing Pt Will Perform Upper Body Dressing: Independently;sitting Pt Will Perform Lower Body Dressing: Independently;sit to/from stand Pt Will Transfer to Toilet: Independently;ambulating Pt Will Perform Toileting - Clothing Manipulation and hygiene: Independently;sit to/from stand Additional ADL Goal #1: Patient will tolerate 15 minutes of therapeutic activity without need for rest break indicating increased activity tolerance.  Plan Discharge plan remains appropriate;Frequency remains appropriate    Co-evaluation                 AM-PAC OT "6 Clicks" Daily Activity     Outcome Measure   Help from another person eating meals?: None Help from another person taking care of personal grooming?: A Little Help from another person toileting, which includes using toliet, bedpan, or urinal?: A Little Help from another person bathing (including washing, rinsing, drying)?: A Little Help from another person to put on and taking off regular upper body clothing?: A Little Help from another person to put on and taking off regular lower body clothing?: A Little 6 Click Score: 19    End of Session Equipment Utilized During Treatment: Gait belt;Rolling walker;Oxygen  OT Visit Diagnosis: Unsteadiness on feet (R26.81);Muscle weakness (generalized) (M62.81)   Activity Tolerance Patient tolerated treatment well   Patient Left Other (comment) (Handoff to PT.)   Nurse Communication Mobility status;Other (comment) (Response to treatment.)        Time: 9233-0076 OT Time Calculation (min): 22 min  Charges: OT General Charges $OT Visit: 1 Visit OT Treatments $Self Care/Home Management : 8-22 mins  Jadelynn Boylan H. OTR/L Supplemental OT, Department of rehab services  [phone]   Janaia Kozel R H. 10/05/2020, 2:50 PM

## 2020-10-05 NOTE — Progress Notes (Signed)
PROGRESS NOTE  Marcus [name] YPP:[id number] DOB: 1960/10/30   PCP: [name], FNP  Patient is from: Home.  Lives with parents.  Ambulates independently at baseline.  DOA: 09/30/2020 LOS: 5  Chief complaints:  Found down with hemoptysis.    Brief Narrative / Interim history: 60 year old M with PMH of COPD on prednisone, RF on 2 L, RA, pancytopenia, pancytopenia, tobacco use disorder and recent hospitalization at [facility] for cavitary/necrotizing pneumonia after he presented with hemoptysis and shortness of breath for which she was discharged on Zyvox and Flagyl, brought back to [facility] ED after found down with hemoptysis.  He was admitted for hypovolemic/hemorrhagic shock in the setting of hemoptysis and GI bleed.  Hgb was 8.0 (from 10.62 days prior).  SBP 64.  HR in 120s.  He was resuscitated with 2 units and IV fluid.  Hemoptysis slowed down.  He was transferred to [facility] for pulmonary, CVTS and IR evaluation.  At [facility], he had multiple blood transfusions and 2 units of FFP.  He also received octreotide infusion.  GI consulted.  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  There was no bleeding vessel noted on angiogram for embolization.  He remained intubated after EGD and extubated on 6/28.  He remained stable and transferred to [facility] hospitalist service on 6/30.  Hospital course noteworthy of Klebsiella pneumonia bacteremia.  ID gave recommendation and signed off.   Subjective: Seen and examined earlier this morning.  No major events overnight of this morning.  No complaints.  He denies hemoptysis, GI bleed, chest pain and dyspnea.  Admits to occasional cough.  Objective: Vitals:   10/05/20 0900 10/05/20 1000 10/05/20 1100 10/05/20 1200  BP: 124/75 122/67 129/73 106/66  Pulse: (!) 58 84 68 61  Resp: _0 Temp:      TempSrc:      SpO2: 100% 98% 99% 99%  Weight:      Height:        Intake/Output Summary (Last  24 hours) at 10/05/2020 1437 Last data filed at 10/05/2020 0800 Gross per 24 hour  Intake 653.74 ml  Output 6030 ml  Net -5376.26 ml   Filed Weights   10/02/20 0115 10/03/20 0600 10/03/20 2000  Weight: 64.6 kg 64.6 kg 68.1 kg    Examination:  GENERAL: No apparent distress.  Nontoxic. HEENT: MMM.  Vision and hearing grossly intact.  NECK: Supple.  No apparent JVD.  RESP: 99% on 3 L.  No IWOB.  Mild rhonchi bilaterally. CVS:  RRR. Heart sounds normal.  ABD/GI/GU: BS+. Abd soft, NTND.  MSK/EXT:  Moves extremities. No apparent deformity. No edema.  SKIN: no apparent skin lesion or wound NEURO: Awake and alert. Oriented appropriately.  No apparent focal neuro deficit. PSYCH: Calm. Normal affect.   Procedures:  6/27-EGD with concern for Grade D esophagitis, type 1 gastric varices s/p clipping. 6/27-6/28-ETT  Microbiology summarized: MRSA PCR screen positive. Blood culture at [facility] with Klebsiella pneumonia BAL pending.  Assessment & Plan: Acute Blood Loss Anemia due to hemoptysis, GI bleed and thrombocytopenia:  Hypovolemic/hemorrhagic shock-due to hemoptysis and GI bleed.  Resolved Hgb nadir at 5.1 but stable after blood transfusions. No further hemoptysis.  Still with some melena. Seems she has received about 10 units of blood products on 6/27.   Recent Labs    10/01/20 1129 10/01/20 1520 10/01/20 1613 10/01/20 1720 10/01/20 2338 10/02/20 0410 10/02/20 1204 10/03/20 4580 10/04/20 9983  10/05/20 0056  HGB 5.1* 10.2* 11.7* 9.9* 10.4* 10.2* 10.2* 9.0* 9.8* 10.7*  -Monitor H&H-stable. -Continue Protonix -SCD for VTE prophylaxis. 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 Liver cirrhosis/elevated liver enzymes-LFT improving. -On chronic prednisone for rheumatoid arthritis -Continue Protonix twice daily -Off octreotide -Needs pneumococcal  vaccine in August 2022 / or before discharge -Appreciate help by GI-signed off.   Acute on chronic hemoptysis likely due to RUL Cavitary pneumonia/lesion, possible Mycetoma. Recent MRSA /Prevotella in Lungs, Klebsiella PNA Bacteremia Acute on Chronic Hypoxemic Respiratory Failure-on 2 L at baseline.  Resolved. Tobacco Abuse -Wean oxygen -Continue monitoring hemoptysis. -Continue continue tranexamic acid nebulizers -Appreciate ID recs Continue CTX through 7/3 and transition to Keflex for 2 days for Klebsiella bacteremia. Change vancomycin to doxycyline for 3 more weeks from 6/30 for suspected MRSA cavitary lesion. Follow up with Dr. Delaine Lame in 3 weeks. -Follow up fungitell (repeat)  -Encourage tobacco cessation. -Nicotine patch   COPD without Exacerbation -On prednisone 10 mg twice daily-chronic -Continue brovana BID, dulera PRN -Hold home Dulera    RA: Stable. -Transition from stress dose to home prednisone on 6/30.  Hypokalemia/hypomagnesemia: K 2.9.  Mg 1.6.  Unclear cause of this.  Not on diuretics -K-Dur 40 mill equivalent x x3 -IV magnesium sulfate 2 g x 1  Metabolic alkalosis: Bicarb 35.  CO2 retention? -Wean oxygen    Felty Syndrome  Pancytopenia (anemia, leukopenia and thrombocytopenia): Improving. -follow up with [facility] post discharge  Debility/physical deconditioning -PT/OT eval   Severe malnutrition Body mass index is 22.17 kg/m. Nutrition Problem: Severe Malnutrition Etiology: chronic illness Signs/Symptoms: percent weight loss, severe fat depletion, severe muscle depletion Interventions: Ensure Enlive (each supplement provides 350kcal and 20 grams of protein), Magic cup, MVI   DVT prophylaxis:  SCDs Start: 10/01/20 0006  Code Status: Full code Family Communication: Patient and/or RN.  Updated patient's brother at bedside. Level of care: Med-Surg Status is: Inpatient  Remains inpatient appropriate because:Persistent severe  electrolyte disturbances, IV treatments appropriate due to intensity of illness or inability to take PO, and Inpatient level of care appropriate due to severity of illness  Dispo: The patient is from: Home              Anticipated d/c is to: Home              Patient currently is not medically stable to d/c.   Difficult to place patient No       Consultants:  PCCM Gastroenterology Interventional radiology   Sch Meds:  Scheduled Meds:  arformoterol  15 mcg Nebulization BID   [START ON 10/07/2020] cephALEXin  500 mg Oral Q6H   Chlorhexidine Gluconate Cloth  6 each Topical Q2000   doxycycline  100 mg Oral Q12H   feeding supplement  237 mL Oral BID BM   metroNIDAZOLE  500 mg Oral Q8H   multivitamin with minerals  1 tablet Oral Daily   mupirocin ointment  1 application Nasal BID   nicotine  21 mg Transdermal Daily   pantoprazole  40 mg Oral BID   potassium chloride  40 mEq Oral Q4H   predniSONE  10 mg Oral BID WC   Continuous Infusions:  sodium chloride 20 mL/hr at 10/02/20 0300   cefTRIAXone (ROCEPHIN)  IV Stopped (10/05/20 0618)   PRN Meds:.albuterol, oxyCODONE  Antimicrobials: Anti-infectives (From admission, onward)    Start  Dose/Rate Route Frequency Ordered Stop   10/07/20 0000  cephALEXin (KEFLEX) capsule 500 mg        500 mg Oral Every 6 hours 10/03/20 1242 10/08/20 2359   10/04/20 1400  metroNIDAZOLE (FLAGYL) tablet 500 mg        500 mg Oral Every 8 hours 10/04/20 0855 10/25/20 2359   10/04/20 1000  doxycycline (VIBRA-TABS) tablet 100 mg        100 mg Oral Every 12 hours 10/04/20 0855 10/25/20 2359   10/04/20 0600  ceFAZolin (ANCEF) IVPB 2g/100 mL premix  Status:  Discontinued        2 g 200 mL/hr over 30 Minutes Intravenous Every 8 hours 10/03/20 1107 10/03/20 1214   10/04/20 0600  cefTRIAXone (ROCEPHIN) 2 g in sodium chloride 0.9 % 100 mL IVPB        2 g 200 mL/hr over 30 Minutes Intravenous Every 24 hours 10/03/20 1220 10/06/20 2359   10/03/20 1300   cefTRIAXone (ROCEPHIN) 2 g in sodium chloride 0.9 % 100 mL IVPB  Status:  Discontinued        2 g 200 mL/hr over 30 Minutes Intravenous Every 24 hours 10/03/20 1214 10/03/20 1220   10/02/20 0600  cefTRIAXone (ROCEPHIN) 2 g in sodium chloride 0.9 % 100 mL IVPB  Status:  Discontinued        2 g 200 mL/hr over 30 Minutes Intravenous Every 24 hours 10/01/20 0556 10/03/20 1107   10/01/20 2300  vancomycin (VANCOREADY) IVPB 750 mg/150 mL  Status:  Discontinued        750 mg 150 mL/hr over 60 Minutes Intravenous Every 12 hours 10/01/20 1052 10/04/20 0855   10/01/20 1400  metroNIDAZOLE (FLAGYL) IVPB 500 mg  Status:  Discontinued        500 mg 100 mL/hr over 60 Minutes Intravenous Every 8 hours 10/01/20 1036 10/04/20 0855   10/01/20 1200  vancomycin (VANCOREADY) IVPB 1000 mg/200 mL        1,000 mg 200 mL/hr over 60 Minutes Intravenous  Once 10/01/20 1052 10/01/20 1829   10/01/20 0645  cefTRIAXone (ROCEPHIN) 2 g in sodium chloride 0.9 % 100 mL IVPB        2 g 200 mL/hr over 30 Minutes Intravenous  Once 10/01/20 0556 10/01/20 0911   10/01/20 0400  linezolid (ZYVOX) IVPB 600 mg  Status:  Discontinued        600 mg 300 mL/hr over 60 Minutes Intravenous Every 12 hours 10/01/20 0004 10/01/20 1040   10/01/20 0100  metroNIDAZOLE (FLAGYL) tablet 500 mg  Status:  Discontinued        500 mg Oral 2 times daily 10/01/20 0004 10/01/20 1036        I have personally reviewed the following labs and images: CBC: Recent Labs  Lab 09/30/20 0939 09/30/20 1225 09/30/20 1759 10/02/20 0410 10/02/20 1204 10/03/20 0437 10/04/20 0539 10/05/20 0056  WBC 7.3 8.2   < > 2.6* 2.7* 2.1* 1.7* 1.8*  NEUTROABS 4.6 5.3  --   --   --   --   --   --   HGB 8.0* 8.9*   < > 10.2* 10.2* 9.0* 9.8* 10.7*  HCT 25.0* 25.8*   < > 29.3* 30.0* 26.1* 29.0* 32.3*  MCV 88.7 84.9   < > 88.5 89.0 90.3 90.6 91.8  PLT 104* 77*   < > 87* 106* 109* 133* 132*   < > = values in this interval not displayed.   BMP &GFR Recent Labs  Lab  09/30/20 1053 10/01/20 0544 10/01/20 1613 10/01/20 1720 10/02/20 0410 10/03/20 0437 10/04/20 0539 10/05/20 0056 10/05/20 0955  NA 136   < > 141 143 142 146* 140 138  --   K 5.4*   < > 4.1 4.0 4.4 3.7 3.1* 2.9*  --   CL 101   < > 112*  --  113* 116* 106 96*  --   CO2 18*   < > 21*  --  19* 25 26 35*  --   GLUCOSE 94   < > 151*  --  180* 125* 116* 113*  --   BUN 88*   < > 52*  --  51* 36* 18 13  --   CREATININE 1.55*   < > 0.56*  --  0.77 0.51* 0.64 0.56*  --   CALCIUM 7.1*   < > 7.2*  --  7.4* 7.8* 7.6* 8.0*  --   MG 2.0  --   --   --   --   --  2.0  --  1.6*   < > = values in this interval not displayed.   Estimated Creatinine Clearance: 95.8 mL/min (A) (by C-G formula based on SCr of 0.56 mg/dL (L)). Liver & Pancreas: Recent Labs  Lab 10/01/20 0544 10/02/20 0410 10/03/20 0437 10/04/20 0539 10/05/20 0056  AST 298* 236* 113* 68* 65*  ALT 214* 211* 164* 145* 136*  ALKPHOS 97 92 103 118 167*  BILITOT 0.6 0.7 1.2 1.7* 2.1*  PROT 3.4* 4.1* 4.2* 4.7* 5.5*  ALBUMIN 1.4* 1.9* 1.9* 2.3* 2.6*   No results for input(s): LIPASE, AMYLASE in the last 168 hours. No results for input(s): AMMONIA in the last 168 hours. Diabetic: No results for input(s): HGBA1C in the last 72 hours. Recent Labs  Lab 10/03/20 1907 10/03/20 2315 10/04/20 0309 10/04/20 0724 10/04/20 1124  GLUCAP 205* 134* 109* 114* 199*   Cardiac Enzymes: No results for input(s): CKTOTAL, CKMB, CKMBINDEX, TROPONINI in the last 168 hours. No results for input(s): PROBNP in the last 8760 hours. Coagulation Profile: Recent Labs  Lab 09/30/20 0939 10/02/20 1204  INR 1.6* 1.1   Thyroid Function Tests: No results for input(s): TSH, T4TOTAL, FREET4, T3FREE, THYROIDAB in the last 72 hours. Lipid Profile: No results for input(s): CHOL, HDL, LDLCALC, TRIG, CHOLHDL, LDLDIRECT in the last 72 hours.  Anemia Panel: No results for input(s): VITAMINB12, FOLATE, FERRITIN, TIBC, IRON, RETICCTPCT in the last 72  hours. Urine analysis:    Component Value Date/Time   COLORURINE YELLOW (A) 09/30/2020 1215   APPEARANCEUR HAZY (A) 09/30/2020 1215   LABSPEC 1.018 09/30/2020 1215   PHURINE 5.0 09/30/2020 1215   GLUCOSEU NEGATIVE 09/30/2020 1215   HGBUR SMALL (A) 09/30/2020 1215   BILIRUBINUR NEGATIVE 09/30/2020 1215   KETONESUR 5 (A) 09/30/2020 1215   PROTEINUR NEGATIVE 09/30/2020 1215   UROBILINOGEN 0.2 10/21/2012 1737   NITRITE NEGATIVE 09/30/2020 1215   LEUKOCYTESUR NEGATIVE 09/30/2020 1215   Sepsis Labs: Invalid input(s): PROCALCITONIN, [place]  Microbiology: Recent Results (from the past 240 hour(s))  Blood culture (routine x 2)     Status: None   Collection Time: 09/30/20  9:40 AM   Specimen: BLOOD  Result Value Ref Range Status   Specimen Description BLOOD LEFT HAND  Final   Special Requests   Final    BOTTLES DRAWN AEROBIC AND ANAEROBIC Blood Culture adequate volume   Culture   Final    NO GROWTH 5 DAYS Performed at [facility], [address] [place], [place] [address]    Report Status 10/05/2020 FINAL  Final  Blood culture (routine x 2)     Status: Abnormal   Collection Time: 09/30/20  9:40 AM   Specimen: BLOOD  Result Value Ref Range Status   Specimen Description   Final    BLOOD RIGHT UPPER HAND Performed at [facility], [address], [place], [place] [address]    Special Requests   Final    BOTTLES DRAWN AEROBIC AND ANAEROBIC Blood Culture adequate volume Performed at [facility], [address], [place], [place] [address]    Culture  Setup Time   Final    GRAM NEGATIVE RODS ANAEROBIC BOTTLE ONLY CRITICAL RESULT CALLED TO, READ BACK BY AND VERIFIED WITHDayton Bailiff ROBINS @ [id number] 09/30/2020 LFD Performed at [facility], [place] [address] [place], [place] [address]    Culture KLEBSIELLA PNEUMONIAE (A)  Final   Report Status 10/03/2020 FINAL  Final   Organism ID, Bacteria KLEBSIELLA PNEUMONIAE  Final      Susceptibility    Klebsiella pneumoniae - MIC*    AMPICILLIN >=32 RESISTANT Resistant     CEFAZOLIN <=4 SENSITIVE Sensitive     CEFEPIME <=0.12 SENSITIVE Sensitive     CEFTAZIDIME <=1 SENSITIVE Sensitive     CEFTRIAXONE <=0.25 SENSITIVE Sensitive     CIPROFLOXACIN <=0.25 SENSITIVE Sensitive     GENTAMICIN <=1 SENSITIVE Sensitive     IMIPENEM <=0.25 SENSITIVE Sensitive     TRIMETH/SULFA <=20 SENSITIVE Sensitive     AMPICILLIN/SULBACTAM 16 INTERMEDIATE Intermediate     PIP/TAZO 16 SENSITIVE Sensitive     * KLEBSIELLA PNEUMONIAE  Blood Culture ID Panel (Reflexed)     Status: Abnormal   Collection Time: 09/30/20  9:40 AM  Result Value Ref Range Status   Enterococcus faecalis NOT DETECTED NOT DETECTED Final   Enterococcus Faecium NOT DETECTED NOT DETECTED Final   Listeria monocytogenes NOT DETECTED NOT DETECTED Final   Staphylococcus species NOT DETECTED NOT DETECTED Final   Staphylococcus aureus (BCID) NOT DETECTED NOT DETECTED Final   Staphylococcus epidermidis NOT DETECTED NOT DETECTED Final   Staphylococcus lugdunensis NOT DETECTED NOT DETECTED Final   Streptococcus species NOT DETECTED NOT DETECTED Final   Streptococcus agalactiae NOT DETECTED NOT DETECTED Final   Streptococcus pneumoniae NOT DETECTED NOT DETECTED Final   Streptococcus pyogenes NOT DETECTED NOT DETECTED Final   A.calcoaceticus-baumannii NOT DETECTED NOT DETECTED Final   Bacteroides fragilis NOT DETECTED NOT DETECTED Final   Enterobacterales DETECTED (A) NOT DETECTED Final    Comment: Enterobacterales represent a large order of gram negative bacteria, not a single organism. CRITICAL RESULT CALLED TO, READ BACK BY AND VERIFIED WITH: JASON ROBINS @ 2620 09/30/20 LFD    Enterobacter cloacae complex NOT DETECTED NOT DETECTED Final   Escherichia coli NOT DETECTED NOT DETECTED Final   Klebsiella aerogenes NOT DETECTED NOT DETECTED Final   Klebsiella oxytoca NOT DETECTED NOT DETECTED Final   Klebsiella pneumoniae DETECTED (A) NOT  DETECTED Final    Comment: CRITICAL RESULT CALLED TO, READ BACK BY AND VERIFIED WITH: JASON ROBINS _0  09/30/2020 LFD    Proteus species NOT DETECTED NOT DETECTED Final   Salmonella species NOT DETECTED NOT DETECTED Final   Serratia marcescens NOT DETECTED NOT DETECTED Final   Haemophilus influenzae NOT DETECTED NOT DETECTED Final   Neisseria meningitidis NOT DETECTED NOT DETECTED Final   Pseudomonas aeruginosa NOT DETECTED NOT DETECTED Final   Stenotrophomonas maltophilia NOT DETECTED NOT DETECTED Final  Candida albicans NOT DETECTED NOT DETECTED Final   Candida auris NOT DETECTED NOT DETECTED Final   Candida glabrata NOT DETECTED NOT DETECTED Final   Candida krusei NOT DETECTED NOT DETECTED Final   Candida parapsilosis NOT DETECTED NOT DETECTED Final   Candida tropicalis NOT DETECTED NOT DETECTED Final   Cryptococcus neoformans/gattii NOT DETECTED NOT DETECTED Final   CTX-M ESBL NOT DETECTED NOT DETECTED Final   Carbapenem resistance IMP NOT DETECTED NOT DETECTED Final   Carbapenem resistance KPC NOT DETECTED NOT DETECTED Final   Carbapenem resistance NDM NOT DETECTED NOT DETECTED Final   Carbapenem resist OXA 48 LIKE NOT DETECTED NOT DETECTED Final   Carbapenem resistance VIM NOT DETECTED NOT DETECTED Final    Comment: Performed at [facility], [address] [place], [place] [address]  Resp Panel by RT-PCR (Flu A&B, Covid) Nasopharyngeal Swab     Status: None   Collection Time: 09/30/20 10:16 AM   Specimen: Nasopharyngeal Swab; Nasopharyngeal(NP) swabs in vial transport medium  Result Value Ref Range Status   SARS Coronavirus 2 by RT PCR NEGATIVE NEGATIVE Final    Comment: (NOTE) SARS-CoV-2 target nucleic acids are NOT DETECTED.  The SARS-CoV-2 RNA is generally detectable in upper respiratory specimens during the acute phase of infection. The lowest concentration of SARS-CoV-2 viral copies this assay can detect is 138 copies/mL. A negative result does not  preclude SARS-Cov-2 infection and should not be used as the sole basis for treatment or other patient management decisions. A negative result may occur with  improper specimen collection/handling, submission of specimen other than nasopharyngeal swab, presence of viral mutation(s) within the areas targeted by this assay, and inadequate number of viral copies(<138 copies/mL). A negative result must be combined with clinical observations, patient history, and epidemiological information. The expected result is Negative.  Fact Sheet for Patients:  EntrepreneurPulse.com.au  Fact Sheet for Healthcare Providers:  IncredibleEmployment.be  This test is no t yet approved or cleared by the [place] FDA and  has been authorized for detection and/or diagnosis of SARS-CoV-2 by FDA under an Emergency Use Authorization (EUA). This EUA will remain  in effect (meaning this test can be used) for the duration of the COVID-19 declaration under Section 564(b)(1) of the Act, 21 U.S.C.section 360bbb-3(b)(1), unless the authorization is terminated  or revoked sooner.       Influenza A by PCR NEGATIVE NEGATIVE Final   Influenza B by PCR NEGATIVE NEGATIVE Final    Comment: (NOTE) The Xpert Xpress SARS-CoV-2/FLU/RSV plus assay is intended as an aid in the diagnosis of influenza from Nasopharyngeal swab specimens and should not be used as a sole basis for treatment. Nasal washings and aspirates are unacceptable for Xpert Xpress SARS-CoV-2/FLU/RSV testing.  Fact Sheet for Patients: EntrepreneurPulse.com.au  Fact Sheet for Healthcare Providers: IncredibleEmployment.be  This test is not yet approved or cleared by the [place] FDA and has been authorized for detection and/or diagnosis of SARS-CoV-2 by FDA under an Emergency Use Authorization (EUA). This EUA will remain in effect (meaning this test can be used) for the  duration of the COVID-19 declaration under Section 564(b)(1) of the Act, 21 U.S.C. section 360bbb-3(b)(1), unless the authorization is terminated or revoked.  Performed at [facility], [address] [place], [place] [address]   MRSA Next Gen by PCR, Nasal     Status: Abnormal   Collection Time: 10/01/20  5:21 PM   Specimen: Nasal Mucosa; Nasal Swab  Result Value Ref Range Status   MRSA  by PCR Next Gen DETECTED (A) NOT DETECTED Final    Comment: RESULT CALLED TO, READ BACK BY AND VERIFIED WITH: Seward Carol RN [id number] 6/57/22 A BROWNING (NOTE) The GeneXpert MRSA Assay (FDA approved for NASAL specimens only), is one component of a comprehensive MRSA colonization surveillance program. It is not intended to diagnose MRSA infection nor to guide or monitor treatment for MRSA infections. Test performance is not FDA approved in patients less than 55 years old. Performed at [facility], [place] [address] [place], [place] [address]   Culture, Respiratory w Gram Stain     Status: None (Preliminary result)   Collection Time: 10/02/20 11:02 AM   Specimen: Bronchoalveolar Lavage; Respiratory  Result Value Ref Range Status   Specimen Description BRONCHIAL ALVEOLAR LAVAGE  Final   Special Requests NONE  Final   Gram Stain   Final    ABUNDANT WBC PRESENT, PREDOMINANTLY PMN FEW GRAM POSITIVE COCCI IN PAIRS IN CLUSTERS    Culture   Final    RARE STAPHYLOCOCCUS AUREUS SUSCEPTIBILITIES TO FOLLOW Performed at [facility], [place] [address] [place], [place] [address]    Report Status PENDING  Incomplete    Radiology Studies: No results found.     Clemence Stillings T. [place]  If 7PM-7AM, please contact night-coverage [email or web address] 10/05/2020, 2:37 PM

## 2020-10-05 NOTE — Progress Notes (Signed)
Physical Therapy Treatment Patient Details Name: Marcus [name] MRN: [id number] DOB: March 10, 1961 Today's Date: 10/05/2020    History of Present Illness 60 y.o. male presenting to ED 6/27 after being found down by his mother. Patient admitted with hemoptysis, acute GI bleed, hemorrhagic shock, cavitary PNA and acute anemia s/p EGD 6/27 and bronchoscopy 6/28. Patient with multiple recent hospital admssions with similar complains with most recent 6/14-6/24. PMHx significant for COPD on 2-3L home O2, RA, Felty syndrome, and tobacco use.    PT Comments    Patient improved this pm after medication and willing to participate.  Seen following OT session and pt able to ambulate the full unit after completing toileting task.  Brother in the room and pleased with progress.  PT to follow, note that at one point patient declined HH, but worthy to attempt again prior to d/c.    Follow Up Recommendations  Home health PT;Supervision - Intermittent     Equipment Recommendations  None recommended by PT    Recommendations for Other Services       Precautions / Restrictions Precautions Precautions: Fall Precaution Comments: Monitor HR Restrictions Weight Bearing Restrictions: No    Mobility  Bed Mobility Overal bed mobility: Needs Assistance Bed Mobility: Supine to Sit     Supine to sit: Supervision     General bed mobility comments: up sitting EOB with OT    Transfers Overall transfer level: Needs assistance Equipment used: Rolling walker (2 wheeled) Transfers: Sit to/from [facility] Sit to Stand: Supervision;Min guard Stand pivot transfers: Supervision;Min guard       General transfer comment: up from toilet after toileting and hygiene with assist for lines/safety  Ambulation/Gait Ambulation/Gait assistance: Min guard Gait Distance (Feet): 150 Feet Assistive device: Rolling walker (2 wheeled) Gait Pattern/deviations: Step-through pattern;Decreased stride length      General Gait Details: on 3L O2 throughout and SpO2 WNL as ambulating around the unit, assist for safety/balance   Stairs             Wheelchair Mobility    Modified Rankin (Stroke Patients Only)       Balance Overall balance assessment: Needs assistance Sitting-balance support: Single extremity supported;No upper extremity supported;Feet supported Sitting balance-Leahy Scale: Good Sitting balance - Comments: able to complete toilet hygiene in sitting with good stability   Standing balance support: Bilateral upper extremity supported;During functional activity Standing balance-Leahy Scale: Fair Standing balance comment: Able to maintain static standing balance without UE support on RW during hygiene/clothing management. Reliant on BUE on RW for dynamic tasks.                            Cognition Arousal/Alertness: Awake/alert Behavior During Therapy: WFL for tasks assessed/performed Overall Cognitive Status: Within Functional Limits for tasks assessed                                 General Comments: A&Ox4      Exercises      General Comments General comments (skin integrity, edema, etc.): brother present      Pertinent Vitals/Pain Pain Assessment: No/denies pain (reports abdominal pain better since medication)    Home Living                      Prior Function            PT Goals (current goals  can now be found in the care plan section) Acute Rehab PT Goals Patient Stated Goal: To return home. Progress towards PT goals: Progressing toward goals    Frequency    Min 3X/week      PT Plan Current plan remains appropriate    Co-evaluation              AM-PAC PT "6 Clicks" Mobility   Outcome Measure  Help needed turning from your back to your side while in a flat bed without using bedrails?: A Little Help needed moving from lying on your back to sitting on the side of a flat bed without using bedrails?: A  Little Help needed moving to and from a bed to a chair (including a wheelchair)?: A Little Help needed standing up from a chair using your arms (e.g., wheelchair or bedside chair)?: A Little Help needed to walk in hospital room?: A Little Help needed climbing 3-5 steps with a railing? : A Little 6 Click Score: 18    End of Session Equipment Utilized During Treatment: Gait belt;Oxygen Activity Tolerance: Patient tolerated treatment well Patient left: in chair;with call bell/phone within reach;with family/visitor present;with chair alarm set   PT Visit Diagnosis: Unsteadiness on feet (R26.81);Muscle weakness (generalized) (M62.81);Difficulty in walking, not elsewhere classified (R26.2)     Time: 1425-1440 PT Time Calculation (min) (ACUTE ONLY): 15 min  Charges:  $Gait Training: 8-22 mins                     Magda Kiel, PT Acute Rehabilitation Services [phone] [phone] 10/05/2020    Reginia Naas 10/05/2020, 5:08 PM

## 2020-10-05 NOTE — Progress Notes (Signed)
PT Cancellation Note  Patient Details Name: Marcus [name] MRN: [id number] DOB: Feb 28, 1961   Cancelled Treatment:    Reason Eval/Treat Not Completed: Pain limiting ability to participate; patient c/o abdominal pain and not wanting to mobilize today despite encouragement for pain medication and RN in to assist to encourage.  Also informed him will need to ask nursing to assist to mobilize over the weekend.   Magda Kiel, [place] XTAVW:[phone] Office:[phone] 10/05/2020  Reginia Naas 10/05/2020, 11:41 AM

## 2020-10-06 LAB — RENAL FUNCTION PANEL
Albumin: 2.3 g/dL — ABNORMAL LOW (ref 3.5–5.0)
Anion gap: 6 (ref 5–15)
BUN: 14 mg/dL (ref 6–20)
CO2: 33 mmol/L — ABNORMAL HIGH (ref 22–32)
Calcium: 7.9 mg/dL — ABNORMAL LOW (ref 8.9–10.3)
Chloride: 95 mmol/L — ABNORMAL LOW (ref 98–111)
Creatinine, Ser: 0.5 mg/dL — ABNORMAL LOW (ref 0.61–1.24)
GFR, Estimated: >60 mL/min (ref >60)
Glucose, Bld: 156 mg/dL — ABNORMAL HIGH (ref 70–99)
Phosphorus: 1.3 mg/dL — ABNORMAL LOW (ref 2.5–4.6)
Potassium: 4.5 mmol/L (ref 3.5–5.1)
Sodium: 134 mmol/L — ABNORMAL LOW (ref 135–145)

## 2020-10-06 LAB — CBC
HCT: 32.5 % — ABNORMAL LOW (ref 39.0–52.0)
Hemoglobin: 10.7 g/dL — ABNORMAL LOW (ref 13.0–17.0)
MCH: 30.6 pg (ref 26.0–34.0)
MCHC: 32.9 g/dL (ref 30.0–36.0)
MCV: 92.9 fL (ref 80.0–100.0)
Platelets: 128 10*3/uL — ABNORMAL LOW (ref 150–400)
RBC: 3.5 MIL/uL — ABNORMAL LOW (ref 4.22–5.81)
RDW: 19.9 % — ABNORMAL HIGH (ref 11.5–15.5)
WBC: 1.4 10*3/uL — CL (ref 4.0–10.5)
nRBC: 22 % — ABNORMAL HIGH (ref 0.0–0.2)

## 2020-10-06 LAB — CULTURE, RESPIRATORY W GRAM STAIN

## 2020-10-06 LAB — MAGNESIUM: Magnesium: 2.1 mg/dL (ref 1.7–2.4)

## 2020-10-06 MED ORDER — TRAMADOL HCL 50 MG PO TABS
50.0000 mg | ORAL_TABLET | Freq: Once | ORAL | Status: AC
  Administered 2020-10-06: 50 mg via ORAL
  Filled 2020-10-06: qty 1

## 2020-10-06 MED ORDER — OXYCODONE HCL 5 MG PO TABS
5.0000 mg | ORAL_TABLET | Freq: Three times a day (TID) | ORAL | Status: DC | PRN
  Administered 2020-10-06 – 2020-10-07 (×3): 5 mg via ORAL
  Filled 2020-10-06 (×3): qty 1

## 2020-10-06 NOTE — Progress Notes (Signed)
PROGRESS NOTE  Marcus [name] AXK:[id number] DOB: 11/16/1960   PCP: [name], FNP  Patient is from: Home.  Lives with parents.  Ambulates independently at baseline.  DOA: 09/30/2020 LOS: 6  Chief complaints:  Found down with hemoptysis.    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  He was admitted for hypovolemic/hemorrhagic shock in the setting of hemoptysis and GI bleed.  Hgb was 8.0 (from 10.62 days prior).  SBP 64.  HR in 120s.  He was resuscitated with 2 units and IV fluid.  Hemoptysis slowed down.  He was transferred to [facility] for pulmonary, CVTS and IR evaluation.  At [facility], he had multiple blood transfusions and 2 units of FFP.  He also received octreotide infusion.  GI consulted.  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  There was no bleeding vessel noted on angiogram for embolization.  He remained intubated after EGD and extubated on 6/28.  He remained stable and transferred to [facility] hospitalist service on 6/30.  Hospital course noteworthy of Klebsiella pneumonia bacteremia.  ID gave recommendation and signed off.  Can be discharged home on 7/3 after his last dose of ceftriaxone.    Subjective: Seen and examined earlier this morning.  No major events overnight of this morning.  No complaints.  No further hemoptysis, chest pain, shortness of breath, GI or UTI symptoms.  Denies melena hematochezia.  Objective: Vitals:   10/06/20 0800 10/06/20 0826 10/06/20 1149 10/06/20 1553  BP: 112/70  106/73 117/67  Pulse: 94  77 97  Resp: (!) _0 Temp: 98 F (36.7 C)  98.8 F  (37.1 C)   TempSrc: Oral  Oral   SpO2: 98% 98%  90%  Weight:      Height:        Intake/Output Summary (Last 24 hours) at 10/06/2020 1810 Last data filed at 10/06/2020 1543 Gross per 24 hour  Intake 49.97 ml  Output 2425 ml  Net -2375.03 ml   Filed Weights   10/03/20 0600 10/03/20 2000 10/05/20 2205  Weight: 64.6 kg 68.1 kg 60.6 kg    Examination:  GENERAL: No apparent distress.  Nontoxic. HEENT: MMM.  Vision and hearing grossly intact.  NECK: Supple.  No apparent JVD.  RESP: 92% on 1 L.  No IWOB.  Fair aeration bilaterally.  Mild rhonchi bilaterally. CVS:  RRR. Heart sounds normal.  ABD/GI/GU: BS+. Abd soft, NTND.  MSK/EXT:  Moves extremities. No apparent deformity. No edema.  SKIN: no apparent skin lesion or wound NEURO: Awake and alert. Oriented appropriately.  No apparent focal neuro deficit. PSYCH: Calm. Normal affect.   Procedures:  6/27-EGD with concern for Grade D esophagitis, type 1 gastric varices s/p clipping. 6/27-6/28-ETT  Microbiology summarized: MRSA PCR screen positive. Blood culture at [facility] with Klebsiella pneumonia BAL pending.  Assessment & Plan: Acute Blood Loss Anemia due to hemoptysis, GI bleed and thrombocytopenia:  Hypovolemic/hemorrhagic shock-due to hemoptysis and GI bleed.  Resolved Hgb nadir at 5.1 but stable after blood transfusions. No further hemoptysis.  Still with some melena. Seems she has received about 10 units of blood products on  6/27.   Recent Labs    10/01/20 1520 10/01/20 1613 10/01/20 1720 10/01/20 2338 10/02/20 0410 10/02/20 1204 10/03/20 0437 10/04/20 0539 10/05/20 0056 10/06/20 0035  HGB 10.2* 11.7* 9.9* 10.4* 10.2* 10.2* 9.0* 9.8* 10.7* 10.7*  -Monitor H&H-stable. -Continue Protonix -SCD for VTE prophylaxis. 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 Liver cirrhosis/elevated liver  enzymes-LFT improving. -On chronic prednisone for rheumatoid arthritis -Continue Protonix twice daily -Off octreotide -Needs pneumococcal vaccine in August 2022 / or before discharge -Appreciate help by GI-signed off.   Acute on chronic hemoptysis likely due to RUL Cavitary pneumonia/lesion, possible Mycetoma. Recent MRSA /Prevotella in Lungs, Klebsiella PNA Bacteremia Acute on Chronic Hypoxemic Respiratory Failure-on 2 L at baseline.  Resolved. Tobacco Abuse -Wean oxygen -Continue monitoring hemoptysis. -Continue continue tranexamic acid nebulizers -Appreciate ID recs Continue CTX through 7/3 and transition to Keflex for 2 days for Klebsiella bacteremia. Change vancomycin to doxycyline for 3 more weeks from 6/30 for suspected MRSA cavitary lesion. Follow up with Dr. Delaine Lame in 3 weeks. -Follow up fungitell (repeat)  -Encourage tobacco cessation. -Nicotine patch   COPD without Exacerbation -On prednisone 10 mg twice daily-chronic -Continue brovana BID, dulera PRN -Hold home Dulera    RA: Stable. -Transition from stress dose to home prednisone on 6/30.  Hypokalemia/hypomagnesemia: Resolved.  Metabolic alkalosis: Bicarb 35.  CO2 retention?  Improved. -Wean oxygen    Felty Syndrome  Pancytopenia (anemia, leukopenia and thrombocytopenia): WBC worse.  Hgb and platelets stable. -follow up with [facility] post discharge  Debility/physical deconditioning -PT/OT eval   Severe malnutrition Body mass index is 19.74 kg/m. Nutrition Problem: Severe Malnutrition Etiology: chronic illness Signs/Symptoms: percent weight loss, severe fat depletion, severe muscle depletion Interventions: Ensure Enlive (each supplement provides 350kcal and 20 grams of protein), Magic cup, MVI   DVT prophylaxis:  SCDs Start: 10/01/20 0006  Code Status: Full code Family Communication: Patient and/or RN.  Updated patient's brother at bedside on 7/1. Level of care: Med-Surg Status is:  Inpatient  Remains inpatient appropriate because:IV treatments appropriate due to intensity of illness or inability to take PO and Inpatient level of care appropriate due to severity of illness  Dispo: The patient is from: Home              Anticipated d/c is to: Home              Patient currently is not medically stable to d/c.   Difficult to place patient No       Consultants:  PCCM Gastroenterology Interventional radiology   Sch Meds:  Scheduled Meds:  arformoterol  15 mcg Nebulization BID   [START ON 10/07/2020] cephALEXin  500 mg Oral Q6H   Chlorhexidine Gluconate Cloth  6 each Topical Q2000   doxycycline  100 mg Oral Q12H   feeding supplement  237 mL Oral BID BM   metroNIDAZOLE  500 mg Oral Q8H   multivitamin with minerals  1 tablet Oral Daily   nicotine  21 mg Transdermal Daily   pantoprazole  40 mg Oral BID   predniSONE  10 mg Oral BID WC   Continuous Infusions:   PRN Meds:.albuterol, oxyCODONE  Antimicrobials: Anti-infectives (From admission, onward)    Start     Dose/Rate Route Frequency Ordered Stop   10/07/20 0000  cephALEXin (KEFLEX) capsule 500 mg        500 mg Oral Every 6 hours 10/03/20  1242 10/08/20 2359   10/04/20 1400  metroNIDAZOLE (FLAGYL) tablet 500 mg        500 mg Oral Every 8 hours 10/04/20 0855 10/25/20 2359   10/04/20 1000  doxycycline (VIBRA-TABS) tablet 100 mg        100 mg Oral Every 12 hours 10/04/20 0855 10/25/20 2359   10/04/20 0600  ceFAZolin (ANCEF) IVPB 2g/100 mL premix  Status:  Discontinued        2 g 200 mL/hr over 30 Minutes Intravenous Every 8 hours 10/03/20 1107 10/03/20 1214   10/04/20 0600  cefTRIAXone (ROCEPHIN) 2 g in sodium chloride 0.9 % 100 mL IVPB        2 g 200 mL/hr over 30 Minutes Intravenous Every 24 hours 10/03/20 1220 10/06/20 0655   10/03/20 1300  cefTRIAXone (ROCEPHIN) 2 g in sodium chloride 0.9 % 100 mL IVPB  Status:  Discontinued        2 g 200 mL/hr over 30 Minutes Intravenous Every 24 hours 10/03/20  1214 10/03/20 1220   10/02/20 0600  cefTRIAXone (ROCEPHIN) 2 g in sodium chloride 0.9 % 100 mL IVPB  Status:  Discontinued        2 g 200 mL/hr over 30 Minutes Intravenous Every 24 hours 10/01/20 0556 10/03/20 1107   10/01/20 2300  vancomycin (VANCOREADY) IVPB 750 mg/150 mL  Status:  Discontinued        750 mg 150 mL/hr over 60 Minutes Intravenous Every 12 hours 10/01/20 1052 10/04/20 0855   10/01/20 1400  metroNIDAZOLE (FLAGYL) IVPB 500 mg  Status:  Discontinued        500 mg 100 mL/hr over 60 Minutes Intravenous Every 8 hours 10/01/20 1036 10/04/20 0855   10/01/20 1200  vancomycin (VANCOREADY) IVPB 1000 mg/200 mL        1,000 mg 200 mL/hr over 60 Minutes Intravenous  Once 10/01/20 1052 10/01/20 1829   10/01/20 0645  cefTRIAXone (ROCEPHIN) 2 g in sodium chloride 0.9 % 100 mL IVPB        2 g 200 mL/hr over 30 Minutes Intravenous  Once 10/01/20 0556 10/01/20 0911   10/01/20 0400  linezolid (ZYVOX) IVPB 600 mg  Status:  Discontinued        600 mg 300 mL/hr over 60 Minutes Intravenous Every 12 hours 10/01/20 0004 10/01/20 1040   10/01/20 0100  metroNIDAZOLE (FLAGYL) tablet 500 mg  Status:  Discontinued        500 mg Oral 2 times daily 10/01/20 0004 10/01/20 1036        I have personally reviewed the following labs and images: CBC: Recent Labs  Lab 09/30/20 0939 09/30/20 1225 09/30/20 1759 10/02/20 1204 10/03/20 0437 10/04/20 0539 10/05/20 0056 10/06/20 0035  WBC 7.3 8.2   < > 2.7* 2.1* 1.7* 1.8* 1.4*  NEUTROABS 4.6 5.3  --   --   --   --   --   --   HGB 8.0* 8.9*   < > 10.2* 9.0* 9.8* 10.7* 10.7*  HCT 25.0* 25.8*   < > 30.0* 26.1* 29.0* 32.3* 32.5*  MCV 88.7 84.9   < > 89.0 90.3 90.6 91.8 92.9  PLT 104* 77*   < > 106* 109* 133* 132* 128*   < > = values in this interval not displayed.   BMP &GFR Recent Labs  Lab 09/30/20 1053 10/01/20 0544 10/02/20 0410 10/03/20 0437 10/04/20 0539 10/05/20 0056 10/05/20 0955 10/06/20 0035  NA 136   < > 142 146* 140 138  --  134*   K 5.4*   < > 4.4 3.7 3.1* 2.9*  --  4.5  CL 101   < > 113* 116* 106 96*  --  95*  CO2 18*   < > 19* 25 26 35*  --  33*  GLUCOSE 94   < > 180* 125* 116* 113*  --  156*  BUN 88*   < > 51* 36* 18 13  --  14  CREATININE 1.55*   < > 0.77 0.51* 0.64 0.56*  --  0.50*  CALCIUM 7.1*   < > 7.4* 7.8* 7.6* 8.0*  --  7.9*  MG 2.0  --   --   --  2.0  --  1.6* 2.1  PHOS  --   --   --   --   --   --   --  1.3*   < > = values in this interval not displayed.   Estimated Creatinine Clearance: 85.2 mL/min (A) (by C-G formula based on SCr of 0.5 mg/dL (L)). Liver & Pancreas: Recent Labs  Lab 10/01/20 0544 10/02/20 0410 10/03/20 0437 10/04/20 0539 10/05/20 0056 10/06/20 0035  AST 298* 236* 113* 68* 65*  --   ALT 214* 211* 164* 145* 136*  --   ALKPHOS 97 92 103 118 167*  --   BILITOT 0.6 0.7 1.2 1.7* 2.1*  --   PROT 3.4* 4.1* 4.2* 4.7* 5.5*  --   ALBUMIN 1.4* 1.9* 1.9* 2.3* 2.6* 2.3*   No results for input(s): LIPASE, AMYLASE in the last 168 hours. No results for input(s): AMMONIA in the last 168 hours. Diabetic: No results for input(s): HGBA1C in the last 72 hours. Recent Labs  Lab 10/03/20 1907 10/03/20 2315 10/04/20 0309 10/04/20 0724 10/04/20 1124  GLUCAP 205* 134* 109* 114* 199*   Cardiac Enzymes: No results for input(s): CKTOTAL, CKMB, CKMBINDEX, TROPONINI in the last 168 hours. No results for input(s): PROBNP in the last 8760 hours. Coagulation Profile: Recent Labs  Lab 09/30/20 0939 10/02/20 1204  INR 1.6* 1.1   Thyroid Function Tests: No results for input(s): TSH, T4TOTAL, FREET4, T3FREE, THYROIDAB in the last 72 hours. Lipid Profile: No results for input(s): CHOL, HDL, LDLCALC, TRIG, CHOLHDL, LDLDIRECT in the last 72 hours.  Anemia Panel: No results for input(s): VITAMINB12, FOLATE, FERRITIN, TIBC, IRON, RETICCTPCT in the last 72 hours. Urine analysis:    Component Value Date/Time   COLORURINE YELLOW (A) 09/30/2020 1215   APPEARANCEUR HAZY (A) 09/30/2020 1215    LABSPEC 1.018 09/30/2020 1215   PHURINE 5.0 09/30/2020 1215   GLUCOSEU NEGATIVE 09/30/2020 1215   HGBUR SMALL (A) 09/30/2020 1215   BILIRUBINUR NEGATIVE 09/30/2020 1215   KETONESUR 5 (A) 09/30/2020 1215   PROTEINUR NEGATIVE 09/30/2020 1215   UROBILINOGEN 0.2 10/21/2012 1737   NITRITE NEGATIVE 09/30/2020 1215   LEUKOCYTESUR NEGATIVE 09/30/2020 1215   Sepsis Labs: Invalid input(s): PROCALCITONIN, [place]  Microbiology: Recent Results (from the past 240 hour(s))  Blood culture (routine x 2)     Status: None   Collection Time: 09/30/20  9:40 AM   Specimen: BLOOD  Result Value Ref Range Status   Specimen Description BLOOD LEFT HAND  Final   Special Requests   Final    BOTTLES DRAWN AEROBIC AND ANAEROBIC Blood Culture adequate volume   Culture   Final    NO GROWTH 5 DAYS Performed at [facility], [address] [place], [place] [address]    Report Status 10/05/2020 FINAL  Final  Blood culture (routine x 2)     Status: Abnormal   Collection Time: 09/30/20  9:40 AM   Specimen: BLOOD  Result Value Ref Range Status   Specimen Description   Final    BLOOD RIGHT UPPER HAND Performed at [facility], [address], [place], [place] [address]    Special Requests   Final    BOTTLES DRAWN AEROBIC AND ANAEROBIC Blood Culture adequate volume Performed at [facility], [address] [place], [place] [address]    Culture  Setup Time   Final    GRAM NEGATIVE RODS ANAEROBIC BOTTLE ONLY CRITICAL RESULT CALLED TO, READ BACK BY AND VERIFIED WITHDayton Bailiff ROBINS @ [id number] 09/30/2020 LFD Performed at [facility], [place] [address], [place], [place] [address]    Culture KLEBSIELLA PNEUMONIAE (A)  Final   Report Status 10/03/2020 FINAL  Final   Organism ID, Bacteria KLEBSIELLA PNEUMONIAE  Final      Susceptibility   Klebsiella pneumoniae - MIC*    AMPICILLIN >=32 RESISTANT Resistant     CEFAZOLIN <=4 SENSITIVE Sensitive     CEFEPIME <=0.12 SENSITIVE  Sensitive     CEFTAZIDIME <=1 SENSITIVE Sensitive     CEFTRIAXONE <=0.25 SENSITIVE Sensitive     CIPROFLOXACIN <=0.25 SENSITIVE Sensitive     GENTAMICIN <=1 SENSITIVE Sensitive     IMIPENEM <=0.25 SENSITIVE Sensitive     TRIMETH/SULFA <=20 SENSITIVE Sensitive     AMPICILLIN/SULBACTAM 16 INTERMEDIATE Intermediate     PIP/TAZO 16 SENSITIVE Sensitive     * KLEBSIELLA PNEUMONIAE  Blood Culture ID Panel (Reflexed)     Status: Abnormal   Collection Time: 09/30/20  9:40 AM  Result Value Ref Range Status   Enterococcus faecalis NOT DETECTED NOT DETECTED Final   Enterococcus Faecium NOT DETECTED NOT DETECTED Final   Listeria monocytogenes NOT DETECTED NOT DETECTED Final   Staphylococcus species NOT DETECTED NOT DETECTED Final   Staphylococcus aureus (BCID) NOT DETECTED NOT DETECTED Final   Staphylococcus epidermidis NOT DETECTED NOT DETECTED Final   Staphylococcus lugdunensis NOT DETECTED NOT DETECTED Final   Streptococcus species NOT DETECTED NOT DETECTED Final   Streptococcus agalactiae NOT DETECTED NOT DETECTED Final   Streptococcus pneumoniae NOT DETECTED NOT DETECTED Final   Streptococcus pyogenes NOT DETECTED NOT DETECTED Final   A.calcoaceticus-baumannii NOT DETECTED NOT DETECTED Final   Bacteroides fragilis NOT DETECTED NOT DETECTED Final   Enterobacterales DETECTED (A) NOT DETECTED Final    Comment: Enterobacterales represent a large order of gram negative bacteria, not a single organism. CRITICAL RESULT CALLED TO, READ BACK BY AND VERIFIED WITH: JASON ROBINS @ 0973 09/30/20 LFD    Enterobacter cloacae complex NOT DETECTED NOT DETECTED Final   Escherichia coli NOT DETECTED NOT DETECTED Final   Klebsiella aerogenes NOT DETECTED NOT DETECTED Final   Klebsiella oxytoca NOT DETECTED NOT DETECTED Final   Klebsiella pneumoniae DETECTED (A) NOT DETECTED Final    Comment: CRITICAL RESULT CALLED TO, READ BACK BY AND VERIFIED WITH: JASON ROBINS _0  09/30/2020 LFD    Proteus species NOT  DETECTED NOT DETECTED Final   Salmonella species NOT DETECTED NOT DETECTED Final   Serratia marcescens NOT DETECTED NOT DETECTED Final   Haemophilus influenzae NOT DETECTED NOT DETECTED Final   Neisseria meningitidis NOT DETECTED NOT DETECTED Final   Pseudomonas aeruginosa NOT DETECTED NOT DETECTED Final   Stenotrophomonas maltophilia NOT DETECTED NOT DETECTED Final   Candida albicans NOT DETECTED NOT DETECTED Final   Candida auris NOT DETECTED NOT DETECTED  Final   Candida glabrata NOT DETECTED NOT DETECTED Final   Candida krusei NOT DETECTED NOT DETECTED Final   Candida parapsilosis NOT DETECTED NOT DETECTED Final   Candida tropicalis NOT DETECTED NOT DETECTED Final   Cryptococcus neoformans/gattii NOT DETECTED NOT DETECTED Final   CTX-M ESBL NOT DETECTED NOT DETECTED Final   Carbapenem resistance IMP NOT DETECTED NOT DETECTED Final   Carbapenem resistance KPC NOT DETECTED NOT DETECTED Final   Carbapenem resistance NDM NOT DETECTED NOT DETECTED Final   Carbapenem resist OXA 48 LIKE NOT DETECTED NOT DETECTED Final   Carbapenem resistance VIM NOT DETECTED NOT DETECTED Final    Comment: Performed at [facility], [address] [place], [place] [address]  Resp Panel by RT-PCR (Flu A&B, Covid) Nasopharyngeal Swab     Status: None   Collection Time: 09/30/20 10:16 AM   Specimen: Nasopharyngeal Swab; Nasopharyngeal(NP) swabs in vial transport medium  Result Value Ref Range Status   SARS Coronavirus 2 by RT PCR NEGATIVE NEGATIVE Final    Comment: (NOTE) SARS-CoV-2 target nucleic acids are NOT DETECTED.  The SARS-CoV-2 RNA is generally detectable in upper respiratory specimens during the acute phase of infection. The lowest concentration of SARS-CoV-2 viral copies this assay can detect is 138 copies/mL. A negative result does not preclude SARS-Cov-2 infection and should not be used as the sole basis for treatment or other patient management decisions. A negative result may occur  with  improper specimen collection/handling, submission of specimen other than nasopharyngeal swab, presence of viral mutation(s) within the areas targeted by this assay, and inadequate number of viral copies(<138 copies/mL). A negative result must be combined with clinical observations, patient history, and epidemiological information. The expected result is Negative.  Fact Sheet for Patients:  EntrepreneurPulse.com.au  Fact Sheet for Healthcare Providers:  IncredibleEmployment.be  This test is no t yet approved or cleared by the [place] FDA and  has been authorized for detection and/or diagnosis of SARS-CoV-2 by FDA under an Emergency Use Authorization (EUA). This EUA will remain  in effect (meaning this test can be used) for the duration of the COVID-19 declaration under Section 564(b)(1) of the Act, 21 U.S.C.section 360bbb-3(b)(1), unless the authorization is terminated  or revoked sooner.       Influenza A by PCR NEGATIVE NEGATIVE Final   Influenza B by PCR NEGATIVE NEGATIVE Final    Comment: (NOTE) The Xpert Xpress SARS-CoV-2/FLU/RSV plus assay is intended as an aid in the diagnosis of influenza from Nasopharyngeal swab specimens and should not be used as a sole basis for treatment. Nasal washings and aspirates are unacceptable for Xpert Xpress SARS-CoV-2/FLU/RSV testing.  Fact Sheet for Patients: EntrepreneurPulse.com.au  Fact Sheet for Healthcare Providers: IncredibleEmployment.be  This test is not yet approved or cleared by the [place] FDA and has been authorized for detection and/or diagnosis of SARS-CoV-2 by FDA under an Emergency Use Authorization (EUA). This EUA will remain in effect (meaning this test can be used) for the duration of the COVID-19 declaration under Section 564(b)(1) of the Act, 21 U.S.C. section 360bbb-3(b)(1), unless the authorization is terminated  or revoked.  Performed at [facility], [address] [place], [place] [address]   MRSA Next Gen by PCR, Nasal     Status: Abnormal   Collection Time: 10/01/20  5:21 PM   Specimen: Nasal Mucosa; Nasal Swab  Result Value Ref Range Status   MRSA by PCR Next Gen DETECTED (A) NOT DETECTED Final    Comment: RESULT CALLED  TO, READ BACK BY AND VERIFIED WITH: Seward Carol RN [id number] 6/57/22 A BROWNING (NOTE) The GeneXpert MRSA Assay (FDA approved for NASAL specimens only), is one component of a comprehensive MRSA colonization surveillance program. It is not intended to diagnose MRSA infection nor to guide or monitor treatment for MRSA infections. Test performance is not FDA approved in patients less than 60 years old. Performed at [facility], [place] [address], [place], [place] [address]   Culture, Respiratory w Gram Stain     Status: None   Collection Time: 10/02/20 11:02 AM   Specimen: Bronchoalveolar Lavage; Respiratory  Result Value Ref Range Status   Specimen Description BRONCHIAL ALVEOLAR LAVAGE  Final   Special Requests NONE  Final   Gram Stain   Final    ABUNDANT WBC PRESENT, PREDOMINANTLY PMN FEW GRAM POSITIVE COCCI IN PAIRS IN CLUSTERS Performed at [facility], [address], [place], [place] [address]    Culture RARE METHICILLIN RESISTANT STAPHYLOCOCCUS AUREUS  Final   Report Status 10/06/2020 FINAL  Final   Organism ID, Bacteria METHICILLIN RESISTANT STAPHYLOCOCCUS AUREUS  Final      Susceptibility   Methicillin resistant staphylococcus aureus - MIC*    CIPROFLOXACIN <=0.5 SENSITIVE Sensitive     ERYTHROMYCIN >=8 RESISTANT Resistant     GENTAMICIN <=0.5 SENSITIVE Sensitive     OXACILLIN >=4 RESISTANT Resistant     TETRACYCLINE <=1 SENSITIVE Sensitive     VANCOMYCIN <=0.5 SENSITIVE Sensitive     TRIMETH/SULFA <=10 SENSITIVE Sensitive     CLINDAMYCIN <=0.25 SENSITIVE Sensitive     RIFAMPIN <=0.5 SENSITIVE Sensitive     Inducible Clindamycin NEGATIVE  Sensitive     * RARE METHICILLIN RESISTANT STAPHYLOCOCCUS AUREUS    Radiology Studies: No results found.     Zaydn Gutridge T. [place]  If 7PM-7AM, please contact night-coverage [email or web address] 10/06/2020, 6:10 PM

## 2020-10-06 NOTE — Plan of Care (Signed)

## 2020-10-07 LAB — RENAL FUNCTION PANEL
Albumin: 2.1 g/dL — ABNORMAL LOW (ref 3.5–5.0)
Anion gap: 9 (ref 5–15)
BUN: 14 mg/dL (ref 6–20)
CO2: 29 mmol/L (ref 22–32)
Calcium: 7.9 mg/dL — ABNORMAL LOW (ref 8.9–10.3)
Chloride: 96 mmol/L — ABNORMAL LOW (ref 98–111)
Creatinine, Ser: 0.59 mg/dL — ABNORMAL LOW (ref 0.61–1.24)
GFR, Estimated: >60 mL/min (ref >60)
Glucose, Bld: 134 mg/dL — ABNORMAL HIGH (ref 70–99)
Phosphorus: 1.5 mg/dL — ABNORMAL LOW (ref 2.5–4.6)
Potassium: 4.3 mmol/L (ref 3.5–5.1)
Sodium: 134 mmol/L — ABNORMAL LOW (ref 135–145)

## 2020-10-07 LAB — CBC
HCT: 31.7 % — ABNORMAL LOW (ref 39.0–52.0)
Hemoglobin: 10.4 g/dL — ABNORMAL LOW (ref 13.0–17.0)
MCH: 31 pg (ref 26.0–34.0)
MCHC: 32.8 g/dL (ref 30.0–36.0)
MCV: 94.6 fL (ref 80.0–100.0)
Platelets: 120 10*3/uL — ABNORMAL LOW (ref 150–400)
RBC: 3.35 MIL/uL — ABNORMAL LOW (ref 4.22–5.81)
RDW: 20.6 % — ABNORMAL HIGH (ref 11.5–15.5)
WBC: 1.5 10*3/uL — ABNORMAL LOW (ref 4.0–10.5)
nRBC: 14.5 % — ABNORMAL HIGH (ref 0.0–0.2)

## 2020-10-07 LAB — MAGNESIUM: Magnesium: 1.8 mg/dL (ref 1.7–2.4)

## 2020-10-07 MED ORDER — PANTOPRAZOLE SODIUM 40 MG PO TBEC: 40.0000 mg | DELAYED_RELEASE_TABLET | Freq: Two times a day (BID) | ORAL | 1 refills | Status: DC

## 2020-10-07 MED ORDER — CEPHALEXIN 500 MG PO CAPS: 500.0000 mg | ORAL_CAPSULE | Freq: Four times a day (QID) | ORAL | 0 refills | Status: AC

## 2020-10-07 MED ORDER — POTASSIUM & SODIUM PHOSPHATES 280-160-250 MG PO PACK
1.0000 | PACK | Freq: Three times a day (TID) | ORAL | Status: DC
  Administered 2020-10-07: 1 via ORAL
  Filled 2020-10-07 (×4): qty 1

## 2020-10-07 MED ORDER — DOXYCYCLINE HYCLATE 100 MG PO TABS: 100.0000 mg | ORAL_TABLET | Freq: Two times a day (BID) | ORAL | 0 refills | Status: AC

## 2020-10-07 MED ORDER — METRONIDAZOLE 500 MG PO TABS: 500.0000 mg | ORAL_TABLET | Freq: Three times a day (TID) | ORAL | 0 refills | Status: AC

## 2020-10-07 NOTE — Progress Notes (Signed)
RN went over d/c information with pt and pt's brother. NT removed PIV. Belongings with pt. NT transporting pt to private vehicle where his brother will transport him home.

## 2020-10-08 NOTE — Discharge Summary (Signed)
Physician Discharge Summary  Marcus [name] ZTI:[id number] DOB: 02/26/1961 DOA: 09/30/2020  PCP: [name], FNP  Admit date: 09/30/2020 Discharge date: 10/07/2020  Admitted From: Home Disposition: Home   Recommendations for Outpatient Follow-up:  Follow up with PCP in 1-2 weeks Please obtain BMP/CBC in one week Please follow up with ID as recommended.    Discharge Condition: stable.  CODE STATUS:full code.  Diet recommendation: Heart Healthy   Brief/Interim Summary: 60 year old M with PMH of COPD on prednisone, RF on 2 L, RA, pancytopenia, pancytopenia, tobacco use disorder and recent hospitalization at [facility] for cavitary/necrotizing pneumonia after he presented with hemoptysis and shortness of breath for which she was discharged on Zyvox and Flagyl, brought back to [facility] ED after found down with hemoptysis.  He was admitted for hypovolemic/hemorrhagic shock in the setting of hemoptysis and GI bleed.  Hgb was 8.0 (from 10.62 days prior).  SBP 64.  HR in 120s.  He was resuscitated with 2 units and IV fluid.  Hemoptysis slowed down.  He was transferred to [facility] for pulmonary, CVTS and IR evaluation.   At [facility], he had multiple blood transfusions and 2 units of FFP.  He also received octreotide infusion.  GI consulted.  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  There was no bleeding vessel noted on angiogram for embolization.  He remained intubated after EGD and extubated on 6/28.  He remained stable and transferred to [facility] hospitalist service on 6/30.   Hospital course noteworthy of Klebsiella pneumonia bacteremia.  ID gave recommendation and signed off.  Discharge Diagnoses:  Principal Problem:   Hemoptysis Active Problems:   Felty syndrome (HCC)   Cavitary pneumonia   GI bleed   Hemorrhagic shock (HCC)   Acute blood loss anemia   Bleeding gastric varices   Bacteremia   Immunosuppression due to chronic steroid use  (HCC)   Cirrhosis of liver (HCC)  Acute Blood Loss Anemia due to hemoptysis, GI bleed and thrombocytopenia: Hypovolemic/hemorrhagic shock-due to hemoptysis and GI bleed.  Resolved Hgb nadir at 5.1 but stable after blood transfusions. No further hemoptysis.  Still with some melena. Seems he has received about 10 units of blood products on 6/27.   Hemoglobin stable .       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 Liver cirrhosis/elevated liver enzymes-LFT improving. -On chronic prednisone for rheumatoid arthritis -Continue Protonix twice daily -Off octreotide -Needs pneumococcal vaccine in August 2022 -Appreciate help by GI-signed off.   Acute on chronic hemoptysis likely due to RUL Cavitary pneumonia/lesion, possible Mycetoma. Recent MRSA /Prevotella in Lungs, Klebsiella PNA Bacteremia Acute on Chronic Hypoxemic Respiratory Failure-on 2 L at baseline.  Resolved. Tobacco Abuse -Wean oxygen -Continue monitoring hemoptysis. -Continue continue tranexamic acid nebulizers -Appreciate ID recs Continue CTX through 7/3 and transition to Keflex for 2 days for Klebsiella bacteremia. Doxycycline and flagyl to complete the course for cavitary pneumonia. [name]     COPD without Exacerbation -On prednisone 10 mg twice daily-chronic Resume home bronchodilators on discharge.    RA: Stable. -Transition from stress dose to home prednisone on 6/30.   Hypokalemia/hypomagnesemia: Resolved.   Metabolic alkalosis: Bicarb 35.  CO2 retention?  Improved. -Wean oxygen     Felty Syndrome  Pancytopenia (anemia, leukopenia and thrombocytopenia): WBC worse.  Hgb and platelets stable. -follow up with [facility] post discharge   Debility/physical deconditioning -PT/OT eval  Severe malnutrition Body mass index is 19.74 kg/m. Nutrition Problem: Severe Malnutrition Etiology: chronic  illness Signs/Symptoms: percent weight loss, severe fat depletion, severe muscle depletion Interventions: Ensure Enlive (each supplement provides 350kcal and 20 grams of protein), Magic cup, MVI  Discharge Instructions  Discharge Instructions     Diet - low sodium heart healthy   Complete by: As directed    Discharge instructions   Complete by: As directed    Please follow up with PCP in one week.   Increase activity slowly   Complete by: As directed    No wound care   Complete by: As directed       Allergies as of 10/07/2020       Reactions   Levaquin [levofloxacin In D5w] Other (See Comments)   Chest pain   Methotrexate Other (See Comments)   Chest pain        Medication List     STOP taking these medications    linezolid 600 MG tablet Commonly known as: Zyvox       TAKE these medications    albuterol (2.5 MG/3ML) 0.083% nebulizer solution Commonly known as: PROVENTIL Take 3 mLs (2.5 mg total) by nebulization every 6 (six) hours as needed for wheezing or shortness of breath.   albuterol 108 (90 Base) MCG/ACT inhaler Commonly known as: VENTOLIN HFA Inhale 2 puffs into the lungs every 6 (six) hours as needed for wheezing or shortness of breath.   benzonatate 200 MG capsule Commonly known as: TESSALON Take 1 capsule (200 mg total) by mouth 2 (two) times daily as needed for up to 14 days for cough.   cephALEXin 500 MG capsule Commonly known as: KEFLEX Take 1 capsule (500 mg total) by mouth every 6 (six) hours for 2 days.   doxycycline 100 MG tablet Commonly known as: VIBRA-TABS Take 1 tablet (100 mg total) by mouth every 12 (twelve) hours for 19 days.   feeding supplement Liqd Take 237 mLs by mouth 2 (two) times daily between meals.   guaiFENesin 600 MG 12 hr tablet Commonly known as: MUCINEX Take 1 tablet (600 mg total) by mouth 2 (two) times daily for 14 days.   metroNIDAZOLE 500 MG tablet Commonly known as: FLAGYL Take 1 tablet (500 mg total) by  mouth every 8 (eight) hours for 19 days. What changed: when to take this   mometasone-formoterol 100-5 MCG/ACT Aero Commonly known as: DULERA Inhale 2 puffs into the lungs 2 (two) times daily.   multivitamin with minerals Tabs tablet Take 1 tablet by mouth daily.   mupirocin ointment 2 % Commonly known as: BACTROBAN Place into the nose 2 (two) times daily.   nicotine 21 mg/24hr patch Commonly known as: NICODERM CQ - dosed in mg/24 hours Place 1 patch (21 mg total) onto the skin daily for 28 days.   pantoprazole 40 MG tablet Commonly known as: PROTONIX Take 1 tablet (40 mg total) by mouth 2 (two) times daily. What changed: when to take this   predniSONE 10 MG tablet Commonly known as: DELTASONE Take 1 tablet (10 mg total) by mouth 2 (two) times daily with a meal.        Follow-up Information     Tsosie Billing, MD Follow up.   Specialty: Infectious Diseases Why: 7/21 at 11am. Please call to rescheudle if you are not able to make this appointment. Contact information: [place] [address] [phone]  Allergies  Allergen Reactions   Levaquin [Levofloxacin In D5w] Other (See Comments)    Chest pain   Methotrexate Other (See Comments)    Chest pain    Consultations: None.    Procedures/Studies: DG Chest 2 View  Result Date: 09/25/2020 CLINICAL DATA:  Cough.  Follow-up treatment. EXAM: CHEST - 2 VIEW COMPARISON:  CT 09/17/2020.  Chest x-ray 09/17/2020. FINDINGS: Mediastinum and hilar structures appears stable. Prominent cavitary infiltrate/mass is again noted in the anterior medial aspect of the right lung. Slight improvement from prior exams may be present. Mild adjacent infiltrate also again noted. Continued follow-up exam suggested to demonstrate complete resolution. No scratched it tiny right pleural effusion cannot be excluded. No pneumothorax. Old right rib fractures again noted. Degenerative change thoracic  spine. IMPRESSION: Prominent cavitary infiltrate/mass is again noted in the anteromedial aspect of the right lung. Slight improvement from prior exam may be present. Mild adjacent infiltrate also again noted. Continued follow-up exam suggested to demonstrate complete resolution. Electronically Signed   By: Marcello Moores  Register   On: 09/25/2020 11:48   CT Angio Chest PE W and/or Wo Contrast  Result Date: 09/30/2020 CLINICAL DATA:  Hemoptysis, hematochezia, evaluate for GI bleed EXAM: CT ANGIOGRAPHY CHEST, ABDOMEN AND PELVIS TECHNIQUE: Non-contrast CT of the abdomen and pelvis initially obtained. Multidetector CT imaging through the chest, abdomen and pelvis was performed using the standard protocol during bolus administration of intravenous contrast. Multiplanar reconstructed images and MIPs were obtained and reviewed to evaluate the vascular anatomy. CONTRAST:  19m OMNIPAQUE IOHEXOL 350 MG/ML SOLN COMPARISON:  09/17/2020 FINDINGS: CTA CHEST FINDINGS Cardiovascular: Preferential opacification of the thoracic aorta. Normal contour and caliber of the thoracic aorta. No evidence of aneurysm, dissection, or other acute aortic pathology. Scattered aortic atherosclerosis. No evidence of pulmonary embolism on this non tailored examination. Normal heart size. Three-vessel coronary artery calcifications. No pericardial effusion. Mediastinum/Nodes: No enlarged mediastinal, hilar, or axillary lymph nodes. Thyroid gland, trachea, and esophagus demonstrate no significant findings. Lungs/Pleura: Mild centrilobular emphysema. Diffuse bilateral bronchial wall thickening. Redemonstrated relatively thick-walled cavitary lesion of the anterior right upper lobe containing soft tissue attenuation debris and measuring approximately 8.4 x 5.7 cm (series 4, image 48). No pleural effusion or pneumothorax. Musculoskeletal: No chest wall abnormality. No acute or significant osseous findings. Review of the MIP images confirms the above  findings. 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 No evidence of rupture, dissection, or other acute aortic pathology. Standard branching pattern of the abdominal aorta, with solitary bilateral renal arteries. Moderate mixed calcific atherosclerosis, with atherosclerosis at the branch vessel origins without high-grade stenosis. Review of the MIP images confirms the above findings. NON-VASCULAR Hepatobiliary: No solid liver abnormality is seen. Small gallstones in the dependent gallbladder. Gallbladder wall thickening, or biliary dilatation. Pancreas: Unremarkable. No pancreatic ductal dilatation or surrounding inflammatory changes. Spleen: Normal in size without significant abnormality. Adrenals/Urinary Tract: Adrenal glands are unremarkable. Kidneys are normal, without renal calculi, solid lesion, or hydronephrosis. Bladder is unremarkable. Stomach/Bowel: Stomach is within normal limits. Appendix appears normal. No evidence of bowel wall thickening, distention, or inflammatory changes. There is contrast extravasation at the anus (series 6, image 85). Lymphatic: No enlarged abdominal or pelvic lymph nodes. Reproductive: No mass or other significant abnormality. Other: No abdominal wall hernia or abnormality. No abdominopelvic ascites. Musculoskeletal: No acute or significant osseous findings. Review of the MIP images confirms the above findings. IMPRESSION: 1. Redemonstrated, thick-walled cavitary  lesion of the anterior right upper lobe containing soft tissue attenuation history. 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 Cavitary malignancy is a significant differential consideration. 2. There is contrast extravasation at the anus, consistent with hemorrhoidal bleeding or other very distal etiology of GI bleed. 3. Redemonstrated aneurysm of the infrarenal abdominal aorta,  measuring up to 3.2 x 3.1 cm in caliber. No evidence of rupture, dissection, or other acute aortic pathology. Recommend follow-up ultrasound every 3 years. This recommendation follows ACR consensus guidelines: White Paper of the ACR Incidental Findings Committee II on Vascular Findings. J Am Coll Radiol 2013; 10:789-794. 4. Emphysema. 5. Diffuse bilateral bronchial wall thickening, consistent with nonspecific infectious or inflammatory bronchitis. 6. Coronary artery disease. 7. Cholelithiasis. Aortic Atherosclerosis (ICD10-I70.0) and Emphysema (ICD10-J43.9). Electronically Signed   By: Eddie Candle M.D.   On: 09/30/2020 11:51   CT Angio Chest PE W and/or Wo Contrast  Result Date: 09/17/2020 CLINICAL DATA:  Hemoptysis, shortness of breath, abdominal distension EXAM: CT ANGIOGRAPHY CHEST CT ABDOMEN AND PELVIS WITH CONTRAST TECHNIQUE: Multidetector CT imaging of the chest was performed using the standard protocol during bolus administration of intravenous contrast. Multiplanar CT image reconstructions and MIPs were obtained to evaluate the vascular anatomy. Multidetector CT imaging of the abdomen and pelvis was performed using the standard protocol during bolus administration of intravenous contrast. CONTRAST:  12m OMNIPAQUE IOHEXOL 350 MG/ML SOLN COMPARISON:  CT 06/20/2020, 06/22/2020 FINDINGS: CTA CHEST FINDINGS Cardiovascular: Satisfactory opacification of the pulmonary arteries to the segmental level. No evidence of pulmonary embolism. Thoracic aorta is normal in course and caliber. Atherosclerotic calcifications of the aorta and coronary arteries. Normal heart size. No pericardial effusion. Mediastinum/Nodes: Mildly prominent mediastinal lymph nodes are again noted, likely reactive. No axillary or hilar lymphadenopathy. Lungs/Pleura: Redemonstrated cavitary mass located within the inferomedial aspect of the right upper lobe measuring approximately 9.9 x 6.6 x 7.4 cm (previously measured approximately 7.8 x  4.5 x 5.5 cm on 08/22/2020. Increased surrounding consolidation and ground-glass opacity within the adjacent right upper lobe and right middle lobe previously seen cavitary lesion within the medial aspect of the left upper lobe has resolved with small thin walled pleural cyst remaining (series 6, image 44). Scattered areas of ground-glass opacity within the bilateral lower lobes and upper lobes. Background of moderate emphysema. No pleural effusion or pneumothorax. Musculoskeletal: Remote right sixth and seventh rib fractures. No new or acute osseous findings. No chest wall abnormality. Review of the MIP images confirms the above findings. CT ABDOMEN and PELVIS FINDINGS Hepatobiliary: Cholelithiasis without evidence of pericholecystic inflammation by CT. Liver within normal limits. No biliary dilatation. Pancreas: Unremarkable. No pancreatic ductal dilatation or surrounding inflammatory changes. Spleen: Prior splenectomy. Adrenals/Urinary Tract: Unremarkable adrenal glands. Kidneys enhance symmetrically without focal lesion, stone, or hydronephrosis. Ureters are nondilated. Urinary bladder appears unremarkable. Stomach/Bowel: Stomach is within normal limits. Appendix appears normal (series 2, image 58). Incidental note of a small bowel-small bowel intussusception within the mid left abdomen (series 2, image 38). No evidence of bowel wall thickening, distention, or inflammatory changes. Vascular/Lymphatic: Fusiform infrarenal abdominal aortic aneurysm measuring up to 3.1 cm, unchanged. Aortoiliac atherosclerosis. No abdominopelvic lymphadenopathy. Reproductive: Prostate is unremarkable. Other: No free fluid. No abdominopelvic fluid collection. No pneumoperitoneum. Fat containing midline supraumbilical hernia without evidence of complication. Musculoskeletal: No acute or significant osseous findings. Review of the MIP images confirms the above findings. IMPRESSION: 1. No evidence of acute pulmonary embolism. 2.  Enlarging cavitary mass within the inferomedial aspect of the  right upper lobe measuring up to 9.9 cm (previously measured approximately 7.8 x 4.5 x 5.5 cm on 08/22/2020). Increased surrounding consolidation and ground-glass opacity within the adjacent right upper lobe and right middle lobe. Findings remain most compatible with cavitary/necrotizing pneumonia. 3. Scattered areas of ground-glass opacity within the bilateral lower lobes and upper lobes, concerning for multifocal infection. 4. Interval resolution of previously seen cavitary lesion within the medial aspect of the left upper lobe with small thin walled pleural cyst remaining. 5. Cholelithiasis without evidence of pericholecystic inflammation by CT. 6. Incidental note of a small bowel-small bowel intussusception within the mid left abdomen. No evidence of bowel obstruction. 7. Fat containing midline supraumbilical hernia without evidence of complication. 8. Stable infrarenal abdominal aortic aneurysm measuring up to 3.1 cm. Recommend follow-up ultrasound every 3 years. This recommendation follows ACR consensus guidelines: White Paper of the ACR Incidental Findings Committee II on Vascular Findings. J Am Coll Radiol 2013; 10:789-794. Aortic Atherosclerosis (ICD10-I70.0) and Emphysema (ICD10-J43.9). Electronically Signed   By: Davina Poke D.O.   On: 09/17/2020 16:38   MR BRAIN W WO CONTRAST  Result Date: 09/18/2020 CLINICAL DATA:  Brain mass or lesion. 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 Recent MRSA pneumonia. Patient reports visual symptoms and seizure-like activity a few weeks ago. EXAM: MRI HEAD WITHOUT AND WITH CONTRAST TECHNIQUE: Multiplanar, multiecho pulse sequences of the brain and surrounding structures were obtained without and with intravenous contrast. CONTRAST:  31m GADAVIST GADOBUTROL 1 MMOL/ML IV SOLN COMPARISON:  No pertinent prior exams available for  comparison. FINDINGS: Brain: Intermittently motion degraded examination, limiting evaluation. Most notably, there is moderate/severe motion degradation of the sagittal T1 weighted sequence, moderate/severe motion degradation of the axial T2/FLAIR sequence, moderate motion degradation of the axial T1 weighted postcontrast sequence and moderate motion degradation of the coronal T1 weighted postcontrast sequence. Mild generalized cerebral and cerebellar atrophy. Advanced patchy and confluent T2/FLAIR hyperintensity within the cerebral white matter, nonspecific but most often secondary to chronic small vessel ischemia. Chronic lacunar infarct within the left lentiform nucleus. Small foci of SWI signal loss within the left basal ganglia and along the margin of the posterior right lateral ventricle, likely reflecting chronic microhemorrhages. Additional subcentimeter focus of SWI signal loss within the midline cerebellum, which may reflect an additional chronic microhemorrhage or cavernoma. There is no acute infarct. No evidence of intracranial mass. No extra-axial fluid collection. No midline shift. Within the limitations of motion degradation, no abnormal intracranial enhancement is identified. Vascular: Expected proximal arterial flow voids. Non dominant intracranial right vertebral artery. Skull and upper cervical spine: Within the limitations of motion degradation, no focal marrow lesion is identified. Sinuses/Orbits: Visualized orbits show no acute finding. 1.9 cm right maxillary sinus mucous retention cyst. Trace left maxillary sinus mucosal thickening. Other: Trace bilateral mastoid effusions. 2.8 cm ovoid lesion within the left suboccipital scalp demonstrating heterogeneous but predominantly T2 hyperintense signal, T1 hypointense signal, restricted diffusion and no convincing abnormal enhancement. This is favored to reflect a sebaceous/epidermoid cyst. IMPRESSION: Motion degraded examination, as described and  limiting evaluation. No evidence of acute intracranial abnormality. Severe patchy and confluent T2/FLAIR hyperintensity within the cerebral white matter, nonspecific but most often secondary to chronic small vessel ischemia. Chronic left basal ganglia lacunar infarct. Subcentimeter focus of SWI signal loss within the midline cerebellum, which may reflect a chronic microhemorrhage or cavernoma. Mild generalized parenchymal atrophy. Incidentally noted 1.9 cm right maxillary sinus mucous retention cyst. Trace bilateral mastoid effusions.  Electronically Signed   By: Kellie Simmering DO   On: 09/18/2020 15:59   MR CERVICAL SPINE W WO CONTRAST  Result Date: 09/19/2020 CLINICAL DATA:  Acute or progressive myelopathy. EXAM: MRI CERVICAL SPINE WITHOUT AND WITH CONTRAST TECHNIQUE: Multiplanar and multiecho pulse sequences of the cervical spine, to include the craniocervical junction and cervicothoracic junction, were obtained without and with intravenous contrast. CONTRAST:  62m GADAVIST GADOBUTROL 1 MMOL/ML IV SOLN COMPARISON:  No previous cervical imaging. FINDINGS: Alignment: Straightening of the normal cervical lordosis. 1 mm degenerative anterolisthesis C7-T1. Vertebrae: No fracture or focal bone lesion. Cord: No primary cord lesion.  See below regarding stenosis. Posterior Fossa, vertebral arteries, paraspinal tissues: Large sebaceous cyst of the upper left neck. Disc levels: Foramen magnum is widely patent.  C1-2 and C2-3 are normal. C3-4: Endplate osteophytes and bulging of the disc. Canal stenosis with AP diameter in the midline 6 mm. Effacement of the subarachnoid space and slight indentation of the cord. Moderate bilateral foraminal narrowing. C4-5: Endplate osteophytes and protrusion of the disc more prominent towards the left. Effacement of the subarachnoid space and cord deformity, worse on the left. AP diameter of the canal 5.9 mm. Bilateral foraminal stenosis. C5-6: Spondylosis with endplate osteophytes and  bulging of the disc. Canal narrowing with AP diameter in the midline 8.3 mm. No compressive effect upon the cord. Bilateral foraminal narrowing. C6-7: Mild bulging of the disc. No canal stenosis. Foraminal narrowing on the right that could affect the exiting C7 nerve. C7-T1: Facet osteoarthritis with 1 mm of anterolisthesis. No stenosis of the canal or foramina. 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 AP diameter of the canal at C3-4 6 mm. AP diameter of the spinal canal at C4-5 5.9 mm. Effacement of the subarachnoid space with some indentation of the cord. No abnormal cord T2 signal however. Foraminal stenosis that could cause neural compression on either side at C3-4, either side at C4-5, either side at C5-6 and on the right C6-7. Electronically Signed   By: MNelson ChimesM.D.   On: 09/19/2020 18:01   MR THORACIC SPINE W WO CONTRAST  Result Date: 09/19/2020 CLINICAL DATA:  Acute or progressive myelopathy. EXAM: MRI THORACIC WITHOUT AND WITH CONTRAST TECHNIQUE: Multiplanar and multiecho pulse sequences of the thoracic spine were obtained without and with intravenous contrast. CONTRAST:  659mGADAVIST GADOBUTROL 1 MMOL/ML IV SOLN COMPARISON:  Cervical study same day FINDINGS: Alignment:  Normal Vertebrae: No fracture or focal bone lesion. Cord: No cord compression or primary cord lesion. Spinal canal widely patent throughout the region. No evidence of arteriovenous malformation. Paraspinal and other soft tissues: Negative Disc levels: No significant disc pathology in the thoracic region. Wide patency of the canal and foramina. Ordinary mild facet osteoarthritis without edema or enhancement. No encroachment upon the neural spaces. IMPRESSION: Negative thoracic study. No cord compression or cord lesion. No significant degenerative changes. Electronically Signed   By: MaNelson Chimes.D.   On: 09/19/2020 18:03   CT ABDOMEN PELVIS W CONTRAST  Result Date: 09/17/2020 CLINICAL DATA:   Hemoptysis, shortness of breath, abdominal distension EXAM: CT ANGIOGRAPHY CHEST CT ABDOMEN AND PELVIS WITH CONTRAST TECHNIQUE: Multidetector CT imaging of the chest was performed using the standard protocol during bolus administration of intravenous contrast. Multiplanar CT image reconstructions and MIPs were obtained to evaluate the vascular anatomy. Multidetector CT imaging of the abdomen and pelvis was performed using the standard protocol during bolus administration of intravenous contrast. CONTRAST:  7589mMNIPAQUE IOHEXOL 350  MG/ML SOLN COMPARISON:  CT 06/20/2020, 06/22/2020 FINDINGS: CTA CHEST FINDINGS Cardiovascular: Satisfactory opacification of the pulmonary arteries to the segmental level. No evidence of pulmonary embolism. Thoracic aorta is normal in course and caliber. Atherosclerotic calcifications of the aorta and coronary arteries. Normal heart size. No pericardial effusion. Mediastinum/Nodes: Mildly prominent mediastinal lymph nodes are again noted, likely reactive. No axillary or hilar lymphadenopathy. Lungs/Pleura: Redemonstrated cavitary mass located within the inferomedial aspect of the right upper lobe measuring approximately 9.9 x 6.6 x 7.4 cm (previously measured approximately 7.8 x 4.5 x 5.5 cm on 08/22/2020. Increased surrounding consolidation and ground-glass opacity within the adjacent right upper lobe and right middle lobe previously seen cavitary lesion within the medial aspect of the left upper lobe has resolved with small thin walled pleural cyst remaining (series 6, image 44). Scattered areas of ground-glass opacity within the bilateral lower lobes and upper lobes. Background of moderate emphysema. No pleural effusion or pneumothorax. Musculoskeletal: Remote right sixth and seventh rib fractures. No new or acute osseous findings. No chest wall abnormality. Review of the MIP images confirms the above findings. CT ABDOMEN and PELVIS FINDINGS Hepatobiliary: Cholelithiasis without  evidence of pericholecystic inflammation by CT. Liver within normal limits. No biliary dilatation. Pancreas: Unremarkable. No pancreatic ductal dilatation or surrounding inflammatory changes. Spleen: Prior splenectomy. Adrenals/Urinary Tract: Unremarkable adrenal glands. Kidneys enhance symmetrically without focal lesion, stone, or hydronephrosis. Ureters are nondilated. Urinary bladder appears unremarkable. Stomach/Bowel: Stomach is within normal limits. Appendix appears normal (series 2, image 58). Incidental note of a small bowel-small bowel intussusception within the mid left abdomen (series 2, image 38). No evidence of bowel wall thickening, distention, or inflammatory changes. Vascular/Lymphatic: Fusiform infrarenal abdominal aortic aneurysm measuring up to 3.1 cm, unchanged. Aortoiliac atherosclerosis. No abdominopelvic lymphadenopathy. Reproductive: Prostate is unremarkable. Other: No free fluid. No abdominopelvic fluid collection. No pneumoperitoneum. Fat containing midline supraumbilical hernia without evidence of complication. Musculoskeletal: No acute or significant osseous findings. Review of the MIP images confirms the above findings. IMPRESSION: 1. No evidence of acute pulmonary embolism. 2. Enlarging cavitary mass within the inferomedial aspect of the right upper lobe measuring up to 9.9 cm (previously measured approximately 7.8 x 4.5 x 5.5 cm on 08/22/2020). Increased surrounding consolidation and ground-glass opacity within the adjacent right upper lobe and right middle lobe. Findings remain most compatible with cavitary/necrotizing pneumonia. 3. Scattered areas of ground-glass opacity within the bilateral lower lobes and upper lobes, concerning for multifocal infection. 4. Interval resolution of previously seen cavitary lesion within the medial aspect of the left upper lobe with small thin walled pleural cyst remaining. 5. Cholelithiasis without evidence of pericholecystic inflammation by CT. 6.  Incidental note of a small bowel-small bowel intussusception within the mid left abdomen. No evidence of bowel obstruction. 7. Fat containing midline supraumbilical hernia without evidence of complication. 8. Stable infrarenal abdominal aortic aneurysm measuring up to 3.1 cm. Recommend follow-up ultrasound every 3 years. This recommendation follows ACR consensus guidelines: White Paper of the ACR Incidental Findings Committee II on Vascular Findings. J Am Coll Radiol 2013; 10:789-794. Aortic Atherosclerosis (ICD10-I70.0) and Emphysema (ICD10-J43.9). Electronically Signed   By: Davina Poke D.O.   On: 09/17/2020 16:38   IR Angiogram Visceral Selective  Result Date: 10/02/2020 INDICATION: 60 year old male with hematemesis and anemia status post upper endoscopy revealing active bleed from the gastric fundus. Recent CT abdomen pelvis demonstrates active extravasation in the gastric fundus. EXAM: 1. Ultrasound-guided vascular access of the right common femoral artery. 2. Selective catheterization and angiography of the  celiac trunk. 3. Sub selective catheterization angiography of the gastroepiploic artery, left gastric artery, and splenic artery. MEDICATIONS: None. ANESTHESIA/SEDATION: The patient was under general anesthesia prior to the procedure and cared for by the department of Anesthesia throughout. CONTRAST:  63m OMNIPAQUE IOHEXOL 300 MG/ML SOLN, 863mOMNIPAQUE IOHEXOL 300 MG/ML SOLN FLUOROSCOPY TIME:  Fluoroscopy Time: 9 minutes 42 seconds (402 mGy). COMPLICATIONS: None immediate. PROCEDURE: Informed consent was obtained from the patient following explanation of the procedure, risks, benefits and alternatives. The patient understands, agrees and consents for the procedure. All questions were addressed. A time out was performed prior to the initiation of the procedure. Maximal barrier sterile technique utilized including caps, mask, sterile gowns, sterile gloves, large sterile drape, hand hygiene, and  Betadine prep. The right groin was prepped and draped in standard fashion. Preprocedure ultrasound demonstrated patency of the right common femoral artery. The procedure was planned. Subdermal Local anesthesia was provided at the planned needle entry site with 1% lidocaine. A small skin nick was made. Blunt dissection was performed in the subdermal tissues. Under direct ultrasound visualization, the right common femoral artery is punctured with a 21 gauge micropuncture needle. An ultrasound image was recorded and stored in the permanent record. A micropuncture sheath was placed. Limited right lower extremity angiogram was performed which demonstrated appropriate puncture site for arterial closure device use. 80.035 inch J wire was then inserted through the micropuncture which is exchanged for a 5 [place] sheath. A C2 catheter was positioned in the superior abdominal aorta and successfully selected the celiac trunk under fluoroscopic guidance. Celiac angiogram was performed which demonstrated normal caliber and patency of the hepatic artery, GDA into the gastroepiploic artery, and left gastric artery. The splenic artery trunk was patent which bifurcates proximally into pancreatic branches. No active extravasation was seen on the celiac angiogram. A Progreat Omega microcatheter and 0.04 inch synchro soft guidewire were then used to select the gastroepiploic artery. Dedicated angiogram from the distal gastroepiploic artery demonstrated mild hyperemia of the greater curvature of the stomach without evidence of active extravasation. The microcatheter was then withdrawn and repositioned to the left gastric artery. Dedicated angiogram from the proximal left gastric artery demonstrated normal appearance of perfusion to the gastric fundus without evidence of active extravasation. The catheter was then withdrawn and redirected to the proximal splenic artery. Angiogram was performed which demonstrated majority flow to  the pancreatic tail. The spleen is surgically absent. There is no evidence of active extravasation. Given absence of any extravasation, no embolization was performed. The catheters were removed. The right groin arteriotomy was closed with a 6 French Angio-Seal. There is no change in pulses upon completion of the procedure. The patient tolerated the procedure well and was transferred to the ICU postprocedurally. IMPRESSION: No evidence of active extravasation from celiac arterial supply with selective catheterization angiography from the gastroepiploic, left gastric, and residual splenic artery. Normalization was performed. DyRuthann CancerMD [facility] Electronically Signed   By: DyRuthann CancerD   On: 10/02/2020 09:51   IR Angiogram Selective Each Additional Vessel  Result Date: 10/02/2020 INDICATION: [id number] old male with hematemesis and anemia status post upper endoscopy revealing active bleed from the gastric fundus. Recent CT abdomen pelvis demonstrates active extravasation in the gastric fundus. EXAM: 1. Ultrasound-guided vascular access of the right common femoral artery. 2. Selective catheterization and angiography of the celiac trunk. 3. Sub selective catheterization angiography of the gastroepiploic artery, left gastric artery, and splenic artery. MEDICATIONS: None.  ANESTHESIA/SEDATION: The patient was under general anesthesia prior to the procedure and cared for by the department of Anesthesia throughout. CONTRAST:  49m OMNIPAQUE IOHEXOL 300 MG/ML SOLN, 857mOMNIPAQUE IOHEXOL 300 MG/ML SOLN FLUOROSCOPY TIME:  Fluoroscopy Time: 9 minutes 42 seconds (402 mGy). COMPLICATIONS: None immediate. PROCEDURE: Informed consent was obtained from the patient following explanation of the procedure, risks, benefits and alternatives. The patient understands, agrees and consents for the procedure. All questions were addressed. A time out was performed prior to  the initiation of the procedure. Maximal barrier sterile technique utilized including caps, mask, sterile gowns, sterile gloves, large sterile drape, hand hygiene, and Betadine prep. The right groin was prepped and draped in standard fashion. Preprocedure ultrasound demonstrated patency of the right common femoral artery. The procedure was planned. Subdermal Local anesthesia was provided at the planned needle entry site with 1% lidocaine. A small skin nick was made. Blunt dissection was performed in the subdermal tissues. Under direct ultrasound visualization, the right common femoral artery is punctured with a 21 gauge micropuncture needle. An ultrasound image was recorded and stored in the permanent record. A micropuncture sheath was placed. Limited right lower extremity angiogram was performed which demonstrated appropriate puncture site for arterial closure device use. 80.035 inch J wire was then inserted through the micropuncture which is exchanged for a 5 Fr[place] sheath. A C2 catheter was positioned in the superior abdominal aorta and successfully selected the celiac trunk under fluoroscopic guidance. Celiac angiogram was performed which demonstrated normal caliber and patency of the hepatic artery, GDA into the gastroepiploic artery, and left gastric artery. The splenic artery trunk was patent which bifurcates proximally into pancreatic branches. No active extravasation was seen on the celiac angiogram. A Progreat Omega microcatheter and 0.04 inch synchro soft guidewire were then used to select the gastroepiploic artery. Dedicated angiogram from the distal gastroepiploic artery demonstrated mild hyperemia of the greater curvature of the stomach without evidence of active extravasation. The microcatheter was then withdrawn and repositioned to the left gastric artery. Dedicated angiogram from the proximal left gastric artery demonstrated normal appearance of perfusion to the gastric fundus without  evidence of active extravasation. The catheter was then withdrawn and redirected to the proximal splenic artery. Angiogram was performed which demonstrated majority flow to the pancreatic tail. The spleen is surgically absent. There is no evidence of active extravasation. Given absence of any extravasation, no embolization was performed. The catheters were removed. The right groin arteriotomy was closed with a 6 French Angio-Seal. There is no change in pulses upon completion of the procedure. The patient tolerated the procedure well and was transferred to the ICU postprocedurally. IMPRESSION: No evidence of active extravasation from celiac arterial supply with selective catheterization angiography from the gastroepiploic, left gastric, and residual splenic artery. Normalization was performed. DyRuthann CancerMD [facility] Electronically Signed   By: DyRuthann CancerD   On: 10/02/2020 09:51   IR Angiogram Selective Each Additional Vessel  Result Date: 10/02/2020 INDICATION: [id number] old male with hematemesis and anemia status post upper endoscopy revealing active bleed from the gastric fundus. Recent CT abdomen pelvis demonstrates active extravasation in the gastric fundus. EXAM: 1. Ultrasound-guided vascular access of the right common femoral artery. 2. Selective catheterization and angiography of the celiac trunk. 3. Sub selective catheterization angiography of the gastroepiploic artery, left gastric artery, and splenic artery. MEDICATIONS: None. ANESTHESIA/SEDATION: The patient was under general anesthesia prior to the procedure and cared for by the department of Anesthesia  throughout. CONTRAST:  74m OMNIPAQUE IOHEXOL 300 MG/ML SOLN, 878mOMNIPAQUE IOHEXOL 300 MG/ML SOLN FLUOROSCOPY TIME:  Fluoroscopy Time: 9 minutes 42 seconds (402 mGy). COMPLICATIONS: None immediate. PROCEDURE: Informed consent was obtained from the patient following explanation of the  procedure, risks, benefits and alternatives. The patient understands, agrees and consents for the procedure. All questions were addressed. A time out was performed prior to the initiation of the procedure. Maximal barrier sterile technique utilized including caps, mask, sterile gowns, sterile gloves, large sterile drape, hand hygiene, and Betadine prep. The right groin was prepped and draped in standard fashion. Preprocedure ultrasound demonstrated patency of the right common femoral artery. The procedure was planned. Subdermal Local anesthesia was provided at the planned needle entry site with 1% lidocaine. A small skin nick was made. Blunt dissection was performed in the subdermal tissues. Under direct ultrasound visualization, the right common femoral artery is punctured with a 21 gauge micropuncture needle. An ultrasound image was recorded and stored in the permanent record. A micropuncture sheath was placed. Limited right lower extremity angiogram was performed which demonstrated appropriate puncture site for arterial closure device use. 80.035 inch J wire was then inserted through the micropuncture which is exchanged for a 5 Fr[place] sheath. A C2 catheter was positioned in the superior abdominal aorta and successfully selected the celiac trunk under fluoroscopic guidance. Celiac angiogram was performed which demonstrated normal caliber and patency of the hepatic artery, GDA into the gastroepiploic artery, and left gastric artery. The splenic artery trunk was patent which bifurcates proximally into pancreatic branches. No active extravasation was seen on the celiac angiogram. A Progreat Omega microcatheter and 0.04 inch synchro soft guidewire were then used to select the gastroepiploic artery. Dedicated angiogram from the distal gastroepiploic artery demonstrated mild hyperemia of the greater curvature of the stomach without evidence of active extravasation. The microcatheter was then withdrawn and  repositioned to the left gastric artery. Dedicated angiogram from the proximal left gastric artery demonstrated normal appearance of perfusion to the gastric fundus without evidence of active extravasation. The catheter was then withdrawn and redirected to the proximal splenic artery. Angiogram was performed which demonstrated majority flow to the pancreatic tail. The spleen is surgically absent. There is no evidence of active extravasation. Given absence of any extravasation, no embolization was performed. The catheters were removed. The right groin arteriotomy was closed with a 6 French Angio-Seal. There is no change in pulses upon completion of the procedure. The patient tolerated the procedure well and was transferred to the ICU postprocedurally. IMPRESSION: No evidence of active extravasation from celiac arterial supply with selective catheterization angiography from the gastroepiploic, left gastric, and residual splenic artery. Normalization was performed. DyRuthann CancerMD [facility] Electronically Signed   By: DyRuthann CancerD   On: 10/02/2020 09:51   IR USKoreauide Vasc Access Right  Result Date: 10/02/2020 INDICATION: [id number] old male with hematemesis and anemia status post upper endoscopy revealing active bleed from the gastric fundus. Recent CT abdomen pelvis demonstrates active extravasation in the gastric fundus. EXAM: 1. Ultrasound-guided vascular access of the right common femoral artery. 2. Selective catheterization and angiography of the celiac trunk. 3. Sub selective catheterization angiography of the gastroepiploic artery, left gastric artery, and splenic artery. MEDICATIONS: None. ANESTHESIA/SEDATION: The patient was under general anesthesia prior to the procedure and cared for by the department of Anesthesia throughout. CONTRAST:  305mMNIPAQUE IOHEXOL 300 MG/ML SOLN, 26m77mNIPAQUE IOHEXOL 300 MG/ML SOLN FLUOROSCOPY TIME:  Fluoroscopy Time: 9 minutes 42 seconds (402 mGy). COMPLICATIONS: None immediate. PROCEDURE: Informed consent was obtained from the patient following explanation of the procedure, risks, benefits and alternatives. The patient understands, agrees and consents for the procedure. All questions were addressed. A time out was performed prior to the initiation of the procedure. Maximal barrier sterile technique utilized including caps, mask, sterile gowns, sterile gloves, large sterile drape, hand hygiene, and Betadine prep. The right groin was prepped and draped in standard fashion. Preprocedure ultrasound demonstrated patency of the right common femoral artery. The procedure was planned. Subdermal Local anesthesia was provided at the planned needle entry site with 1% lidocaine. A small skin nick was made. Blunt dissection was performed in the subdermal tissues. Under direct ultrasound visualization, the right common femoral artery is punctured with a 21 gauge micropuncture needle. An ultrasound image was recorded and stored in the permanent record. A micropuncture sheath was placed. Limited right lower extremity angiogram was performed which demonstrated appropriate puncture site for arterial closure device use. 80.035 inch J wire was then inserted through the micropuncture which is exchanged for a 5 [place] vascular sheath. A C2 catheter was positioned in the superior abdominal aorta and successfully selected the celiac trunk under fluoroscopic guidance. Celiac angiogram was performed which demonstrated normal caliber and patency of the hepatic artery, GDA into the gastroepiploic artery, and left gastric artery. The splenic artery trunk was patent which bifurcates proximally into pancreatic branches. No active extravasation was seen on the celiac angiogram. A Progreat Omega microcatheter and 0.04 inch synchro soft guidewire were then used to select the gastroepiploic artery. Dedicated angiogram from the distal  gastroepiploic artery demonstrated mild hyperemia of the greater curvature of the stomach without evidence of active extravasation. The microcatheter was then withdrawn and repositioned to the left gastric artery. Dedicated angiogram from the proximal left gastric artery demonstrated normal appearance of perfusion to the gastric fundus without evidence of active extravasation. The catheter was then withdrawn and redirected to the proximal splenic artery. Angiogram was performed which demonstrated majority flow to the pancreatic tail. The spleen is surgically absent. There is no evidence of active extravasation. Given absence of any extravasation, no embolization was performed. The catheters were removed. The right groin arteriotomy was closed with a 6 French Angio-Seal. There is no change in pulses upon completion of the procedure. The patient tolerated the procedure well and was transferred to the ICU postprocedurally. IMPRESSION: No evidence of active extravasation from celiac arterial supply with selective catheterization angiography from the gastroepiploic, left gastric, and residual splenic artery. Normalization was performed. Ruthann Cancer, MD [facility] Radiology Electronically Signed   By: Ruthann Cancer MD   On: 10/02/2020 09:51   DG CHEST PORT 1 VIEW  Result Date: 10/01/2020 CLINICAL DATA:  Intubation. EXAM: PORTABLE CHEST 1 VIEW COMPARISON:  September 30, 2020. FINDINGS: The heart size and mediastinal contours are within normal limits. Endotracheal tube appears to be in good position. Right internal jugular catheter is noted with tip in expected position of the SVC. Right upper lobe airspace opacity is noted concerning for pneumonia. The visualized skeletal structures are unremarkable. IMPRESSION: Endotracheal tube and right internal jugular catheter in grossly good position. Stable right lung opacity. Electronically Signed   By: Marijo Conception M.D.   On:  10/01/2020 16:31   DG Chest Portable 1 View  Result Date: 09/30/2020 CLINICAL DATA:  Hemoptysis, GI bleed. EXAM: PORTABLE CHEST 1 VIEW COMPARISON:  September 25, 2020. FINDINGS: EKG  leads project over the chest. Persistent opacity in the RIGHT mid chest in the area of the cavitary focus that was seen on prior CT imaging. Unchanged compared to September 25, 2020. No new areas of lobar consolidative changes. No pneumothorax.  No pleural effusion on frontal radiograph. No acute bone finding on limited assessment. IMPRESSION: 1. Persistent opacity in the RIGHT mid chest in the area of the cavitary focus that was seen on prior CT imaging. 2. Essentially no change from recent comparison. Electronically Signed   By: Zetta Bills M.D.   On: 09/30/2020 10:57   DG Chest Portable 1 View  Result Date: 09/17/2020 CLINICAL DATA:  Shortness of breath and hemoptysis EXAM: PORTABLE CHEST 1 VIEW COMPARISON:  September 06, 2020 chest radiograph and chest CT Aug 22, 2020 FINDINGS: The previously noted cavitary mass in the right middle lobe anteriorly is again noted. There is increase in consolidation in this area compared to most recent study. Lungs elsewhere clear. Heart size and pulmonary vascular normal. No adenopathy appreciable. No bone lesions. IMPRESSION: Cavitary mass right middle lobe anteriorly with increase in surrounding consolidation compared to most recent study. The lungs elsewhere are clear. Heart size normal. No adenopathy appreciable by radiography. Electronically Signed   By: Lowella Grip III M.D.   On: 09/17/2020 13:54   US LIVER DOPPLER  Result Date: 10/02/2020 CLINICAL DATA:  60 year old male with upper gastrointestinal hemorrhage. Evaluate for presence cirrhosis and signs of portal hypertension. EXAM: DUPLEX ULTRASOUND OF LIVER TECHNIQUE: Color and duplex Doppler ultrasound was performed to evaluate the hepatic in-flow and out-flow vessels. COMPARISON:  09/30/2020 FINDINGS: Liver: Normal parenchymal  echogenicity. Micro nodularity about the surface contour of the liver. No focal lesion, mass or intrahepatic biliary ductal dilatation. Main Portal Vein size: 1.0 cm Portal Vein Velocities Main Prox:  23 cm/sec, antegrade Main Mid: 21 cm/sec, antegrade Main Dist:  24 cm/sec, antegrade Right: 20 cm/sec, antegrade Left: 12 cm/sec, antegrade Hepatic Vein Velocities Right:  23 cm/sec Middle:  31 cm/sec Left:  24 cm/sec IVC: Present and patent with normal respiratory phasicity. Hepatic Artery Velocity:  44 cm/sec Splenic Vein Velocity:  20 cm/sec Spleen: Surgically absent. Portal Vein Occlusion/Thrombus: No Splenic Vein Occlusion/Thrombus: No Ascites: Trace right perinephric fluid. Varices: None IMPRESSION: 1. Mild nodular hepatic contour about in otherwise normal appearing hepatic parenchyma as could be seen with early hepatic cirrhosis. 2. Patent visualized portal system with antegrade flow. Ruthann Cancer, MD [facility] Radiology Electronically Signed   By: Ruthann Cancer MD   On: 10/02/2020 11:35   CT Angio Abd/Pel w/ and/or w/o  Result Date: 10/03/2020 CLINICAL DATA:  60 year old male with GI bleeding, previous negative mesenteric angiogram with continued melena EXAM: CTA ABDOMEN AND PELVIS WITHOUT AND WITH CONTRAST TECHNIQUE: Multidetector CT imaging of the abdomen and pelvis was performed using the standard protocol during bolus administration of intravenous contrast. Multiplanar reconstructed images and MIPs were obtained and reviewed to evaluate the vascular anatomy. CONTRAST:  176m OMNIPAQUE IOHEXOL 350 MG/ML SOLN COMPARISON:  09/30/2020 FINDINGS: VASCULAR Aorta: Diameter of the distal thoracic aorta at the hiatus measures 2.1 cm. Atherosclerotic changes of the abdominal aorta without pedunculated plaque or ulcerated plaque. No dissection. Greatest diameter of the infrarenal abdominal aorta 3.1 cm, unchanged. Celiac: Celiac artery patent. Minimal  atherosclerosis. Surgical ligation of the splenic artery. Left gastric artery patent. Hepatic artery patent with branches patent. SMA: Atherosclerotic changes at the SMA origin with mixed calcified and soft plaque. Branches are patent. Renals: -  Right: Single right renal artery with minimal atherosclerosis at the origin. Normal course caliber and contour. - Left: Single left renal artery with minimal atherosclerosis. No high-grade stenosis. Normal course caliber and contour. IMA: IMA is patent. Right lower extremity: Unremarkable course, caliber, and contour of the right iliac system. No aneurysm, dissection, or occlusion. Mild atherosclerosis without high-grade stenosis or occlusion. Hypogastric artery is patent. Common femoral artery patent. Proximal SFA and profunda femoris patent. Left lower extremity: Unremarkable course, caliber, and contour of the left iliac system. No aneurysm, dissection, or occlusion. Mild atherosclerosis with no high-grade stenosis or occlusion. Hypogastric artery is patent. Common femoral artery patent. Proximal SFA and profunda femoris patent. Veins: Portal: Portal vein is patent. Left and right portal veins patent. Superior mesenteric vein and inferior mesenteric vein are patent. No calcifications, filling defects, or stenosis. There are no enlarged gastric varices or esophageal varices identified. Systemic: Unremarkable IVC, iliac veins, proximal femoral veins. Bilateral renal veins patent, unremarkable with no accessory renal veins identified. No portosystemic shunt identified. Review of the MIP images confirms the above findings. NON-VASCULAR Lower chest: Small bilateral pleural effusions. Emphysematous changes at the lung bases. Motion artifact somewhat limits evaluation. Scarring/atelectasis present, as was previous. Hepatobiliary: Relatively unremarkable appearance of liver. Slight heterogeneous enhancement profile of the posterior right liver, which was present on the  comparison, likely perfusion phenomenon. No focal lesion identified. Partially calcified material within the gallbladder lumen compatible with cholelithiasis. No inflammatory changes. Pancreas: Unremarkable. Spleen: Splenectomy Adrenals/Urinary Tract: - Right adrenal gland: Unremarkable - Left adrenal gland: Unremarkable. - Right kidney: No hydronephrosis, nephrolithiasis, inflammation, or ureteral dilation. No focal lesion. - Left Kidney: No hydronephrosis, nephrolithiasis, inflammation, or ureteral dilation. No focal lesion. - Urinary Bladder: Unremarkable. Stomach/Bowel: - Stomach: Surgical clips within the gastric cardia. No extravasation of contrast or pooling of contrast. Otherwise unremarkable stomach. - Small bowel: Small bowel unremarkable. - Appendix: Normal. - Colon: No inflammatory changes of the colon. No significant stool burden. No extravasation of contrast/pooling of contrast. Diverticular disease within the left colon with no acute inflammatory changes. Fluid within the distal sigmoid colon and within the rectum, nonspecific. Lymphatic: No adenopathy. Mesenteric: No free fluid or air. No mesenteric adenopathy. Reproductive: Unremarkable prostate Other: Fat containing left inguinal hernia. Right inguinal hernia containing mostly fat. There is a wall of short segment small bowel which enters the base of the hernia sac of the right inguinal hernia. Surgical changes along the midline abdomen with Richter type hernia involving transverse colon in the subxiphoid region as well as transverse colon and small bowel near the umbilicus. No evidence of entrapment of small bowel or colon. Edema within the body wall and mesentery compatible with anasarca. Musculoskeletal: No acute displaced fracture. Degenerative changes of the spine. No bony canal narrowing. Degenerative changes of the hips. IMPRESSION: CT angiogram is negative for gastrointestinal hemorrhage. Negative for overt signs of portal hypertension,  without esophageal varices, gastric varices, or overt portosystemic shunt. Evidence of fluid positive state with body wall/mesenteric edema/anasarca and small bilateral pleural effusions. Aortic atherosclerosis and redemonstration infrarenal abdominal aortic aneurysm measuring 3.1 cm. Aortic Atherosclerosis (ICD10-I70.0). Aortic aneurysm NOS (ICD10-I71.9). Recommend follow-up every 3 years. This recommendation follows ACR consensus guidelines: White Paper of the ACR Incidental Findings Committee II on Vascular Findings. J Am Coll Radiol 2013; 10:789-794. Additional ancillary findings as above. Signed, Dulcy Fanny. Dellia Nims, RPVI [facility] Electronically Signed   By: Corrie Mckusick D.O.   On: 10/03/2020 11:06  CT Angio Abd/Pel W and/or Wo Contrast  Result Date: 09/30/2020 CLINICAL DATA:  Hemoptysis, hematochezia, evaluate for GI bleed EXAM: CT ANGIOGRAPHY CHEST, ABDOMEN AND PELVIS TECHNIQUE: Non-contrast CT of the abdomen and pelvis initially obtained. Multidetector CT imaging through the chest, abdomen and pelvis was performed using the standard protocol during bolus administration of intravenous contrast. Multiplanar reconstructed images and MIPs were obtained and reviewed to evaluate the vascular anatomy. CONTRAST:  176m OMNIPAQUE IOHEXOL 350 MG/ML SOLN COMPARISON:  09/17/2020 FINDINGS: CTA CHEST FINDINGS Cardiovascular: Preferential opacification of the thoracic aorta. Normal contour and caliber of the thoracic aorta. No evidence of aneurysm, dissection, or other acute aortic pathology. Scattered aortic atherosclerosis. No evidence of pulmonary embolism on this non tailored examination. Normal heart size. Three-vessel coronary artery calcifications. No pericardial effusion. Mediastinum/Nodes: No enlarged mediastinal, hilar, or axillary lymph nodes. Thyroid gland, trachea, and esophagus demonstrate no significant findings. Lungs/Pleura: Mild  centrilobular emphysema. Diffuse bilateral bronchial wall thickening. Redemonstrated relatively thick-walled cavitary lesion of the anterior right upper lobe containing soft tissue attenuation debris and measuring approximately 8.4 x 5.7 cm (series 4, image 48). No pleural effusion or pneumothorax. Musculoskeletal: No chest wall abnormality. No acute or significant osseous findings. Review of the MIP images confirms the above findings. 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 No evidence of rupture, dissection, or other acute aortic pathology. Standard branching pattern of the abdominal aorta, with solitary bilateral renal arteries. Moderate mixed calcific atherosclerosis, with atherosclerosis at the branch vessel origins without high-grade stenosis. Review of the MIP images confirms the above findings. NON-VASCULAR Hepatobiliary: No solid liver abnormality is seen. Small gallstones in the dependent gallbladder. Gallbladder wall thickening, or biliary dilatation. Pancreas: Unremarkable. No pancreatic ductal dilatation or surrounding inflammatory changes. Spleen: Normal in size without significant abnormality. Adrenals/Urinary Tract: Adrenal glands are unremarkable. Kidneys are normal, without renal calculi, solid lesion, or hydronephrosis. Bladder is unremarkable. Stomach/Bowel: Stomach is within normal limits. Appendix appears normal. No evidence of bowel wall thickening, distention, or inflammatory changes. There is contrast extravasation at the anus (series 6, image 85). Lymphatic: No enlarged abdominal or pelvic lymph nodes. Reproductive: No mass or other significant abnormality. Other: No abdominal wall hernia or abnormality. No abdominopelvic ascites. Musculoskeletal: No acute or significant osseous findings. Review of the MIP images confirms the above findings. IMPRESSION: 1. Redemonstrated, thick-walled  cavitary lesion of the anterior right upper lobe containing soft tissue attenuation history. 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 Cavitary malignancy is a significant differential consideration. 2. There is contrast extravasation at the anus, consistent with hemorrhoidal bleeding or other very distal etiology of GI bleed. 3. 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 No evidence of rupture, dissection, or other acute aortic pathology. Recommend follow-up ultrasound every 3 years. This recommendation follows ACR consensus guidelines: White Paper of the ACR Incidental Findings Committee II on Vascular Findings. J Am Coll Radiol 2013; 10:789-794. 4. Emphysema. 5. Diffuse bilateral bronchial wall thickening, consistent with nonspecific infectious or inflammatory bronchitis. 6. Coronary artery disease. 7. Cholelithiasis. Aortic Atherosclerosis (ICD10-I70.0) and Emphysema (ICD10-J43.9). Electronically Signed   By: AEddie CandleM.D.   On: 09/30/2020 11:51      Subjective: Wants to go home   Discharge Exam: Vitals:   10/07/20 1100 10/07/20 1500  BP: (!) 97/59 117/69  Pulse: 91 (!) 101  Resp: 20 20  Temp:  99.5 F (37.5 C) 98.6 F (37 C)  SpO2: 98% 97%   Vitals:   10/07/20 0700 10/07/20 0811 10/07/20 1100 10/07/20 1500  BP: 122/74  (!) 97/59 117/69  Pulse: 86 92 91 (!) 101  Resp: (!) _0 Temp: 98.5 F (36.9 C)  99.5 F (37.5 C) 98.6 F (37 C)  TempSrc: Oral  Oral Oral  SpO2: 95% 97% 98% 97%  Weight:      Height:        General: Pt is alert, awake, not in acute distress Cardiovascular: RRR, S1/S2 +, no rubs, no gallops Respiratory: CTA bilaterally, no wheezing, no rhonchi Abdominal: Soft, NT, ND, bowel sounds + Extremities: no edema, no cyanosis    The results of significant diagnostics from this hospitalization (including imaging, microbiology, ancillary and laboratory)  are listed below for reference.     Microbiology: Recent Results (from the past 240 hour(s))  Blood culture (routine x 2)     Status: None   Collection Time: 09/30/20  9:40 AM   Specimen: BLOOD  Result Value Ref Range Status   Specimen Description BLOOD LEFT HAND  Final   Special Requests   Final    BOTTLES DRAWN AEROBIC AND ANAEROBIC Blood Culture adequate volume   Culture   Final    NO GROWTH 5 DAYS Performed at [facility], [address], [place], [place] [address]    Report Status 10/05/2020 FINAL  Final  Blood culture (routine x 2)     Status: Abnormal   Collection Time: 09/30/20  9:40 AM   Specimen: BLOOD  Result Value Ref Range Status   Specimen Description   Final    BLOOD RIGHT UPPER HAND Performed at [facility], [address], [place], [place] [address]    Special Requests   Final    BOTTLES DRAWN AEROBIC AND ANAEROBIC Blood Culture adequate volume Performed at [facility], [address], [place], [place] [address]    Culture  Setup Time   Final    GRAM NEGATIVE RODS ANAEROBIC BOTTLE ONLY CRITICAL RESULT CALLED TO, READ BACK BY AND VERIFIED WITHDayton Bailiff ROBINS @ 0768 09/30/2020 LFD Performed at [facility], [place] [address] [place], [place] [address]    Culture KLEBSIELLA PNEUMONIAE (A)  Final   Report Status 10/03/2020 FINAL  Final   Organism ID, Bacteria KLEBSIELLA PNEUMONIAE  Final      Susceptibility   Klebsiella pneumoniae - MIC*    AMPICILLIN >=32 RESISTANT Resistant     CEFAZOLIN <=4 SENSITIVE Sensitive     CEFEPIME <=0.12 SENSITIVE Sensitive     CEFTAZIDIME <=1 SENSITIVE Sensitive     CEFTRIAXONE <=0.25 SENSITIVE Sensitive     CIPROFLOXACIN <=0.25 SENSITIVE Sensitive     GENTAMICIN <=1 SENSITIVE Sensitive     IMIPENEM <=0.25 SENSITIVE Sensitive     TRIMETH/SULFA <=20 SENSITIVE Sensitive     AMPICILLIN/SULBACTAM 16 INTERMEDIATE Intermediate     PIP/TAZO 16 SENSITIVE Sensitive     * KLEBSIELLA PNEUMONIAE   Blood Culture ID Panel (Reflexed)     Status: Abnormal   Collection Time: 09/30/20  9:40 AM  Result Value Ref Range Status   Enterococcus faecalis NOT DETECTED NOT DETECTED Final   Enterococcus Faecium NOT DETECTED NOT DETECTED Final   Listeria monocytogenes NOT DETECTED NOT DETECTED Final   Staphylococcus species NOT DETECTED NOT DETECTED Final   Staphylococcus aureus (BCID) NOT DETECTED NOT DETECTED Final   Staphylococcus epidermidis NOT DETECTED NOT DETECTED  Final   Staphylococcus lugdunensis NOT DETECTED NOT DETECTED Final   Streptococcus species NOT DETECTED NOT DETECTED Final   Streptococcus agalactiae NOT DETECTED NOT DETECTED Final   Streptococcus pneumoniae NOT DETECTED NOT DETECTED Final   Streptococcus pyogenes NOT DETECTED NOT DETECTED Final   A.calcoaceticus-baumannii NOT DETECTED NOT DETECTED Final   Bacteroides fragilis NOT DETECTED NOT DETECTED Final   Enterobacterales DETECTED (A) NOT DETECTED Final    Comment: Enterobacterales represent a large order of gram negative bacteria, not a single organism. CRITICAL RESULT CALLED TO, READ BACK BY AND VERIFIED WITH: JASON ROBINS @ 0076 09/30/20 LFD    Enterobacter cloacae complex NOT DETECTED NOT DETECTED Final   Escherichia coli NOT DETECTED NOT DETECTED Final   Klebsiella aerogenes NOT DETECTED NOT DETECTED Final   Klebsiella oxytoca NOT DETECTED NOT DETECTED Final   Klebsiella pneumoniae DETECTED (A) NOT DETECTED Final    Comment: CRITICAL RESULT CALLED TO, READ BACK BY AND VERIFIED WITH: JASON ROBINS _0  09/30/2020 LFD    Proteus species NOT DETECTED NOT DETECTED Final   Salmonella species NOT DETECTED NOT DETECTED Final   Serratia marcescens NOT DETECTED NOT DETECTED Final   Haemophilus influenzae NOT DETECTED NOT DETECTED Final   Neisseria meningitidis NOT DETECTED NOT DETECTED Final   Pseudomonas aeruginosa NOT DETECTED NOT DETECTED Final   Stenotrophomonas maltophilia NOT DETECTED NOT DETECTED Final   Candida  albicans NOT DETECTED NOT DETECTED Final   Candida auris NOT DETECTED NOT DETECTED Final   Candida glabrata NOT DETECTED NOT DETECTED Final   Candida krusei NOT DETECTED NOT DETECTED Final   Candida parapsilosis NOT DETECTED NOT DETECTED Final   Candida tropicalis NOT DETECTED NOT DETECTED Final   Cryptococcus neoformans/gattii NOT DETECTED NOT DETECTED Final   CTX-M ESBL NOT DETECTED NOT DETECTED Final   Carbapenem resistance IMP NOT DETECTED NOT DETECTED Final   Carbapenem resistance KPC NOT DETECTED NOT DETECTED Final   Carbapenem resistance NDM NOT DETECTED NOT DETECTED Final   Carbapenem resist OXA 48 LIKE NOT DETECTED NOT DETECTED Final   Carbapenem resistance VIM NOT DETECTED NOT DETECTED Final    Comment: Performed at [facility], [address] [place], [place] [address]  Resp Panel by RT-PCR (Flu A&B, Covid) Nasopharyngeal Swab     Status: None   Collection Time: 09/30/20 10:16 AM   Specimen: Nasopharyngeal Swab; Nasopharyngeal(NP) swabs in vial transport medium  Result Value Ref Range Status   SARS Coronavirus 2 by RT PCR NEGATIVE NEGATIVE Final    Comment: (NOTE) SARS-CoV-2 target nucleic acids are NOT DETECTED.  The SARS-CoV-2 RNA is generally detectable in upper respiratory specimens during the acute phase of infection. The lowest concentration of SARS-CoV-2 viral copies this assay can detect is 138 copies/mL. A negative result does not preclude SARS-Cov-2 infection and should not be used as the sole basis for treatment or other patient management decisions. A negative result may occur with  improper specimen collection/handling, submission of specimen other than nasopharyngeal swab, presence of viral mutation(s) within the areas targeted by this assay, and inadequate number of viral copies(<138 copies/mL). A negative result must be combined with clinical observations, patient history, and epidemiological information. The expected result is Negative.  Fact  Sheet for Patients:  EntrepreneurPulse.com.au  Fact Sheet for Healthcare Providers:  IncredibleEmployment.be  This test is no t yet approved or cleared by the [place] FDA and  has been authorized for detection and/or diagnosis of SARS-CoV-2 by FDA under an Emergency Use Authorization (EUA). This EUA will  remain  in effect (meaning this test can be used) for the duration of the COVID-19 declaration under Section 564(b)(1) of the Act, 21 U.S.C.section 360bbb-3(b)(1), unless the authorization is terminated  or revoked sooner.       Influenza A by PCR NEGATIVE NEGATIVE Final   Influenza B by PCR NEGATIVE NEGATIVE Final    Comment: (NOTE) The Xpert Xpress SARS-CoV-2/FLU/RSV plus assay is intended as an aid in the diagnosis of influenza from Nasopharyngeal swab specimens and should not be used as a sole basis for treatment. Nasal washings and aspirates are unacceptable for Xpert Xpress SARS-CoV-2/FLU/RSV testing.  Fact Sheet for Patients: EntrepreneurPulse.com.au  Fact Sheet for Healthcare Providers: IncredibleEmployment.be  This test is not yet approved or cleared by the [place] FDA and has been authorized for detection and/or diagnosis of SARS-CoV-2 by FDA under an Emergency Use Authorization (EUA). This EUA will remain in effect (meaning this test can be used) for the duration of the COVID-19 declaration under Section 564(b)(1) of the Act, 21 U.S.C. section 360bbb-3(b)(1), unless the authorization is terminated or revoked.  Performed at [facility], [address] [place], [place] [address]   MRSA Next Gen by PCR, Nasal     Status: Abnormal   Collection Time: 10/01/20  5:21 PM   Specimen: Nasal Mucosa; Nasal Swab  Result Value Ref Range Status   MRSA by PCR Next Gen DETECTED (A) NOT DETECTED Final    Comment: RESULT CALLED TO, READ BACK BY AND VERIFIED WITH: Seward Carol RN [id number]  6/57/22 A BROWNING (NOTE) The GeneXpert MRSA Assay (FDA approved for NASAL specimens only), is one component of a comprehensive MRSA colonization surveillance program. It is not intended to diagnose MRSA infection nor to guide or monitor treatment for MRSA infections. Test performance is not FDA approved in patients less than 53 years old. Performed at [facility], [place] [address], [place], [place] [address]   Culture, Respiratory w Gram Stain     Status: None   Collection Time: 10/02/20 11:02 AM   Specimen: Bronchoalveolar Lavage; Respiratory  Result Value Ref Range Status   Specimen Description BRONCHIAL ALVEOLAR LAVAGE  Final   Special Requests NONE  Final   Gram Stain   Final    ABUNDANT WBC PRESENT, PREDOMINANTLY PMN FEW GRAM POSITIVE COCCI IN PAIRS IN CLUSTERS Performed at [facility], [address], [place], [place] [address]    Culture RARE METHICILLIN RESISTANT STAPHYLOCOCCUS AUREUS  Final   Report Status 10/06/2020 FINAL  Final   Organism ID, Bacteria METHICILLIN RESISTANT STAPHYLOCOCCUS AUREUS  Final      Susceptibility   Methicillin resistant staphylococcus aureus - MIC*    CIPROFLOXACIN <=0.5 SENSITIVE Sensitive     ERYTHROMYCIN >=8 RESISTANT Resistant     GENTAMICIN <=0.5 SENSITIVE Sensitive     OXACILLIN >=4 RESISTANT Resistant     TETRACYCLINE <=1 SENSITIVE Sensitive     VANCOMYCIN <=0.5 SENSITIVE Sensitive     TRIMETH/SULFA <=10 SENSITIVE Sensitive     CLINDAMYCIN <=0.25 SENSITIVE Sensitive     RIFAMPIN <=0.5 SENSITIVE Sensitive     Inducible Clindamycin NEGATIVE Sensitive     * RARE METHICILLIN RESISTANT STAPHYLOCOCCUS AUREUS     Labs: BNP (last 3 results) Recent Labs    09/06/20 1319 09/17/20 1345  BNP 104.8* 46.2   Basic Metabolic Panel: Recent Labs  Lab 10/03/20 0437 10/04/20 0539 10/05/20 0056 10/05/20 0955 10/06/20 0035 10/07/20 0228 10/07/20 0232  NA 146* 140 138  --  134*  --  134*  K 3.7 3.1* 2.9*  --  4.5  --  4.3  CL  116* 106 96*  --  95*  --  96*  CO2 25 26 35*  --  33*  --  29  GLUCOSE 125* 116* 113*  --  156*  --  134*  BUN 36* 18 13  --  14  --  14  CREATININE 0.51* 0.64 0.56*  --  0.50*  --  0.59*  CALCIUM 7.8* 7.6* 8.0*  --  7.9*  --  7.9*  MG  --  2.0  --  1.6* 2.1 1.8  --   PHOS  --   --   --   --  1.3*  --  1.5*   Liver Function Tests: Recent Labs  Lab 10/02/20 0410 10/03/20 0437 10/04/20 0539 10/05/20 0056 10/06/20 0035 10/07/20 0232  AST 236* 113* 68* 65*  --   --   ALT 211* 164* 145* 136*  --   --   ALKPHOS 92 103 118 167*  --   --   BILITOT 0.7 1.2 1.7* 2.1*  --   --   PROT 4.1* 4.2* 4.7* 5.5*  --   --   ALBUMIN 1.9* 1.9* 2.3* 2.6* 2.3* 2.1*   No results for input(s): LIPASE, AMYLASE in the last 168 hours. No results for input(s): AMMONIA in the last 168 hours. CBC: Recent Labs  Lab 10/03/20 0437 10/04/20 0539 10/05/20 0056 10/06/20 0035 10/07/20 0228  WBC 2.1* 1.7* 1.8* 1.4* 1.5*  HGB 9.0* 9.8* 10.7* 10.7* 10.4*  HCT 26.1* 29.0* 32.3* 32.5* 31.7*  MCV 90.3 90.6 91.8 92.9 94.6  PLT 109* 133* 132* 128* 120*   Cardiac Enzymes: No results for input(s): CKTOTAL, CKMB, CKMBINDEX, TROPONINI in the last 168 hours. BNP: Invalid input(s): POCBNP CBG: Recent Labs  Lab 10/03/20 1907 10/03/20 2315 10/04/20 0309 10/04/20 0724 10/04/20 1124  GLUCAP 205* 134* 109* 114* 199*   D-Dimer No results for input(s): DDIMER in the last 72 hours. Hgb A1c No results for input(s): HGBA1C in the last 72 hours. Lipid Profile No results for input(s): CHOL, HDL, LDLCALC, TRIG, CHOLHDL, LDLDIRECT in the last 72 hours. Thyroid function studies No results for input(s): TSH, T4TOTAL, T3FREE, THYROIDAB in the last 72 hours.  Invalid input(s): FREET3 Anemia work up No results for input(s): VITAMINB12, FOLATE, FERRITIN, TIBC, IRON, RETICCTPCT in the last 72 hours. Urinalysis    Component Value Date/Time   COLORURINE YELLOW (A) 09/30/2020 1215   APPEARANCEUR HAZY (A) 09/30/2020 1215    LABSPEC 1.018 09/30/2020 1215   PHURINE 5.0 09/30/2020 1215   GLUCOSEU NEGATIVE 09/30/2020 1215   HGBUR SMALL (A) 09/30/2020 1215   BILIRUBINUR NEGATIVE 09/30/2020 1215   KETONESUR 5 (A) 09/30/2020 1215   PROTEINUR NEGATIVE 09/30/2020 1215   UROBILINOGEN 0.2 10/21/2012 1737   NITRITE NEGATIVE 09/30/2020 1215   LEUKOCYTESUR NEGATIVE 09/30/2020 1215   Sepsis Labs Invalid input(s): PROCALCITONIN,  WBC,  LACTICIDVEN Microbiology Recent Results (from the past 240 hour(s))  Blood culture (routine x 2)     Status: None   Collection Time: 09/30/20  9:40 AM   Specimen: BLOOD  Result Value Ref Range Status   Specimen Description BLOOD LEFT HAND  Final   Special Requests   Final    BOTTLES DRAWN AEROBIC AND ANAEROBIC Blood Culture adequate volume   Culture   Final    NO GROWTH 5 DAYS Performed at [facility], [address] [place],  [place] [address]    Report Status 10/05/2020 FINAL  Final  Blood culture (routine x 2)     Status: Abnormal   Collection Time: 09/30/20  9:40 AM   Specimen: BLOOD  Result Value Ref Range Status   Specimen Description   Final    BLOOD RIGHT UPPER HAND Performed at [facility], [address] [place], [place] [address]    Special Requests   Final    BOTTLES DRAWN AEROBIC AND ANAEROBIC Blood Culture adequate volume Performed at [facility], [address] [place], [place] [address]    Culture  Setup Time   Final    GRAM NEGATIVE RODS ANAEROBIC BOTTLE ONLY CRITICAL RESULT CALLED TO, READ BACK BY AND VERIFIED WITHDayton Bailiff ROBINS @ 8938 09/30/2020 LFD Performed at [facility], [place] [address], [place], [place] [address]    Culture KLEBSIELLA PNEUMONIAE (A)  Final   Report Status 10/03/2020 FINAL  Final   Organism ID, Bacteria KLEBSIELLA PNEUMONIAE  Final      Susceptibility   Klebsiella pneumoniae - MIC*    AMPICILLIN >=32 RESISTANT Resistant     CEFAZOLIN <=4 SENSITIVE Sensitive     CEFEPIME <=0.12 SENSITIVE  Sensitive     CEFTAZIDIME <=1 SENSITIVE Sensitive     CEFTRIAXONE <=0.25 SENSITIVE Sensitive     CIPROFLOXACIN <=0.25 SENSITIVE Sensitive     GENTAMICIN <=1 SENSITIVE Sensitive     IMIPENEM <=0.25 SENSITIVE Sensitive     TRIMETH/SULFA <=20 SENSITIVE Sensitive     AMPICILLIN/SULBACTAM 16 INTERMEDIATE Intermediate     PIP/TAZO 16 SENSITIVE Sensitive     * KLEBSIELLA PNEUMONIAE  Blood Culture ID Panel (Reflexed)     Status: Abnormal   Collection Time: 09/30/20  9:40 AM  Result Value Ref Range Status   Enterococcus faecalis NOT DETECTED NOT DETECTED Final   Enterococcus Faecium NOT DETECTED NOT DETECTED Final   Listeria monocytogenes NOT DETECTED NOT DETECTED Final   Staphylococcus species NOT DETECTED NOT DETECTED Final   Staphylococcus aureus (BCID) NOT DETECTED NOT DETECTED Final   Staphylococcus epidermidis NOT DETECTED NOT DETECTED Final   Staphylococcus lugdunensis NOT DETECTED NOT DETECTED Final   Streptococcus species NOT DETECTED NOT DETECTED Final   Streptococcus agalactiae NOT DETECTED NOT DETECTED Final   Streptococcus pneumoniae NOT DETECTED NOT DETECTED Final   Streptococcus pyogenes NOT DETECTED NOT DETECTED Final   A.calcoaceticus-baumannii NOT DETECTED NOT DETECTED Final   Bacteroides fragilis NOT DETECTED NOT DETECTED Final   Enterobacterales DETECTED (A) NOT DETECTED Final    Comment: Enterobacterales represent a large order of gram negative bacteria, not a single organism. CRITICAL RESULT CALLED TO, READ BACK BY AND VERIFIED WITH: JASON ROBINS @ 1025 09/30/20 LFD    Enterobacter cloacae complex NOT DETECTED NOT DETECTED Final   Escherichia coli NOT DETECTED NOT DETECTED Final   Klebsiella aerogenes NOT DETECTED NOT DETECTED Final   Klebsiella oxytoca NOT DETECTED NOT DETECTED Final   Klebsiella pneumoniae DETECTED (A) NOT DETECTED Final    Comment: CRITICAL RESULT CALLED TO, READ BACK BY AND VERIFIED WITH: JASON ROBINS _0  09/30/2020 LFD    Proteus species NOT  DETECTED NOT DETECTED Final   Salmonella species NOT DETECTED NOT DETECTED Final   Serratia marcescens NOT DETECTED NOT DETECTED Final   Haemophilus influenzae NOT DETECTED NOT DETECTED Final   Neisseria meningitidis NOT DETECTED NOT DETECTED Final   Pseudomonas aeruginosa NOT DETECTED NOT DETECTED Final   Stenotrophomonas maltophilia NOT DETECTED NOT DETECTED Final   Candida albicans NOT  DETECTED NOT DETECTED Final   Candida auris NOT DETECTED NOT DETECTED Final   Candida glabrata NOT DETECTED NOT DETECTED Final   Candida krusei NOT DETECTED NOT DETECTED Final   Candida parapsilosis NOT DETECTED NOT DETECTED Final   Candida tropicalis NOT DETECTED NOT DETECTED Final   Cryptococcus neoformans/gattii NOT DETECTED NOT DETECTED Final   CTX-M ESBL NOT DETECTED NOT DETECTED Final   Carbapenem resistance IMP NOT DETECTED NOT DETECTED Final   Carbapenem resistance KPC NOT DETECTED NOT DETECTED Final   Carbapenem resistance NDM NOT DETECTED NOT DETECTED Final   Carbapenem resist OXA 48 LIKE NOT DETECTED NOT DETECTED Final   Carbapenem resistance VIM NOT DETECTED NOT DETECTED Final    Comment: Performed at [facility], [address] [place], [place] [address]  Resp Panel by RT-PCR (Flu A&B, Covid) Nasopharyngeal Swab     Status: None   Collection Time: 09/30/20 10:16 AM   Specimen: Nasopharyngeal Swab; Nasopharyngeal(NP) swabs in vial transport medium  Result Value Ref Range Status   SARS Coronavirus 2 by RT PCR NEGATIVE NEGATIVE Final    Comment: (NOTE) SARS-CoV-2 target nucleic acids are NOT DETECTED.  The SARS-CoV-2 RNA is generally detectable in upper respiratory specimens during the acute phase of infection. The lowest concentration of SARS-CoV-2 viral copies this assay can detect is 138 copies/mL. A negative result does not preclude SARS-Cov-2 infection and should not be used as the sole basis for treatment or other patient management decisions. A negative result may occur  with  improper specimen collection/handling, submission of specimen other than nasopharyngeal swab, presence of viral mutation(s) within the areas targeted by this assay, and inadequate number of viral copies(<138 copies/mL). A negative result must be combined with clinical observations, patient history, and epidemiological information. The expected result is Negative.  Fact Sheet for Patients:  EntrepreneurPulse.com.au  Fact Sheet for Healthcare Providers:  IncredibleEmployment.be  This test is no t yet approved or cleared by the [place] FDA and  has been authorized for detection and/or diagnosis of SARS-CoV-2 by FDA under an Emergency Use Authorization (EUA). This EUA will remain  in effect (meaning this test can be used) for the duration of the COVID-19 declaration under Section 564(b)(1) of the Act, 21 U.S.C.section 360bbb-3(b)(1), unless the authorization is terminated  or revoked sooner.       Influenza A by PCR NEGATIVE NEGATIVE Final   Influenza B by PCR NEGATIVE NEGATIVE Final    Comment: (NOTE) The Xpert Xpress SARS-CoV-2/FLU/RSV plus assay is intended as an aid in the diagnosis of influenza from Nasopharyngeal swab specimens and should not be used as a sole basis for treatment. Nasal washings and aspirates are unacceptable for Xpert Xpress SARS-CoV-2/FLU/RSV testing.  Fact Sheet for Patients: EntrepreneurPulse.com.au  Fact Sheet for Healthcare Providers: IncredibleEmployment.be  This test is not yet approved or cleared by the [place] FDA and has been authorized for detection and/or diagnosis of SARS-CoV-2 by FDA under an Emergency Use Authorization (EUA). This EUA will remain in effect (meaning this test can be used) for the duration of the COVID-19 declaration under Section 564(b)(1) of the Act, 21 U.S.C. section 360bbb-3(b)(1), unless the authorization is terminated  or revoked.  Performed at [facility], [address] [place], [place] [address]   MRSA Next Gen by PCR, Nasal     Status: Abnormal   Collection Time: 10/01/20  5:21 PM   Specimen: Nasal Mucosa; Nasal Swab  Result Value Ref Range Status   MRSA by PCR Next  Gen DETECTED (A) NOT DETECTED Final    Comment: RESULT CALLED TO, READ BACK BY AND VERIFIED WITH: Seward Carol RN [id number] 6/57/22 A BROWNING (NOTE) The GeneXpert MRSA Assay (FDA approved for NASAL specimens only), is one component of a comprehensive MRSA colonization surveillance program. It is not intended to diagnose MRSA infection nor to guide or monitor treatment for MRSA infections. Test performance is not FDA approved in patients less than 38 years old. Performed at [facility], [place] [address], [place], [place] [address]   Culture, Respiratory w Gram Stain     Status: None   Collection Time: 10/02/20 11:02 AM   Specimen: Bronchoalveolar Lavage; Respiratory  Result Value Ref Range Status   Specimen Description BRONCHIAL ALVEOLAR LAVAGE  Final   Special Requests NONE  Final   Gram Stain   Final    ABUNDANT WBC PRESENT, PREDOMINANTLY PMN FEW GRAM POSITIVE COCCI IN PAIRS IN CLUSTERS Performed at [facility], [address] [place], [place] [address]    Culture RARE METHICILLIN RESISTANT STAPHYLOCOCCUS AUREUS  Final   Report Status 10/06/2020 FINAL  Final   Organism ID, Bacteria METHICILLIN RESISTANT STAPHYLOCOCCUS AUREUS  Final      Susceptibility   Methicillin resistant staphylococcus aureus - MIC*    CIPROFLOXACIN <=0.5 SENSITIVE Sensitive     ERYTHROMYCIN >=8 RESISTANT Resistant     GENTAMICIN <=0.5 SENSITIVE Sensitive     OXACILLIN >=4 RESISTANT Resistant     TETRACYCLINE <=1 SENSITIVE Sensitive     VANCOMYCIN <=0.5 SENSITIVE Sensitive     TRIMETH/SULFA <=10 SENSITIVE Sensitive     CLINDAMYCIN <=0.25 SENSITIVE Sensitive     RIFAMPIN <=0.5 SENSITIVE Sensitive     Inducible Clindamycin NEGATIVE  Sensitive     * RARE METHICILLIN RESISTANT STAPHYLOCOCCUS AUREUS     Time coordinating discharge: 36 minutes SIGNED:   Hosie Poisson, MD  [facility]

## 2020-10-09 LAB — ACID FAST CULTURE WITH REFLEXED SENSITIVITIES (MYCOBACTERIA): Acid Fast Culture: NEGATIVE

## 2020-10-12 LAB — CULTURE, FUNGUS WITHOUT SMEAR: Special Requests: NORMAL

## 2020-10-23 ENCOUNTER — Encounter (HOSPITAL_COMMUNITY): Payer: Self-pay

## 2020-10-25 ENCOUNTER — Ambulatory Visit: Payer: Medicaid Other | Admitting: Infectious Diseases

## 2020-10-30 ENCOUNTER — Emergency Department: Payer: Medicaid Other

## 2020-10-30 ENCOUNTER — Inpatient Hospital Stay
Admission: EM | Admit: 2020-10-30 | Discharge: 2020-11-03 | DRG: 917 | Disposition: A | Discharge status: Home / Self Care | Payer: Medicaid Other | Attending: Internal Medicine | Admitting: Internal Medicine

## 2020-10-30 DIAGNOSIS — D709 Neutropenia, unspecified: Secondary | ICD-10-CM | POA: Diagnosis present

## 2020-10-30 DIAGNOSIS — F17210 Nicotine dependence, cigarettes, uncomplicated: Secondary | ICD-10-CM | POA: Diagnosis present

## 2020-10-30 DIAGNOSIS — I672 Cerebral atherosclerosis: Secondary | ICD-10-CM | POA: Diagnosis present

## 2020-10-30 DIAGNOSIS — R0602 Shortness of breath: Secondary | ICD-10-CM | POA: Diagnosis not present

## 2020-10-30 DIAGNOSIS — E872 Acidosis: Secondary | ICD-10-CM | POA: Diagnosis present

## 2020-10-30 DIAGNOSIS — C91Z0 Other lymphoid leukemia not having achieved remission: Secondary | ICD-10-CM | POA: Diagnosis not present

## 2020-10-30 DIAGNOSIS — I38 Endocarditis, valve unspecified: Secondary | ICD-10-CM | POA: Diagnosis not present

## 2020-10-30 DIAGNOSIS — J9601 Acute respiratory failure with hypoxia: Secondary | ICD-10-CM | POA: Diagnosis present

## 2020-10-30 DIAGNOSIS — M0500 Felty's syndrome, unspecified site: Secondary | ICD-10-CM | POA: Diagnosis present

## 2020-10-30 DIAGNOSIS — Z7151 Drug abuse counseling and surveillance of drug abuser: Secondary | ICD-10-CM | POA: Diagnosis not present

## 2020-10-30 DIAGNOSIS — J9622 Acute and chronic respiratory failure with hypercapnia: Secondary | ICD-10-CM | POA: Diagnosis present

## 2020-10-30 DIAGNOSIS — J851 Abscess of lung with pneumonia: Secondary | ICD-10-CM | POA: Diagnosis present

## 2020-10-30 DIAGNOSIS — Z9981 Dependence on supplemental oxygen: Secondary | ICD-10-CM | POA: Diagnosis not present

## 2020-10-30 DIAGNOSIS — R4189 Other symptoms and signs involving cognitive functions and awareness: Secondary | ICD-10-CM

## 2020-10-30 DIAGNOSIS — Z8249 Family history of ischemic heart disease and other diseases of the circulatory system: Secondary | ICD-10-CM | POA: Diagnosis not present

## 2020-10-30 DIAGNOSIS — J9621 Acute and chronic respiratory failure with hypoxia: Secondary | ICD-10-CM | POA: Diagnosis present

## 2020-10-30 DIAGNOSIS — F1410 Cocaine abuse, uncomplicated: Secondary | ICD-10-CM | POA: Diagnosis present

## 2020-10-30 DIAGNOSIS — T405X1A Poisoning by cocaine, accidental (unintentional), initial encounter: Principal | ICD-10-CM | POA: Diagnosis present

## 2020-10-30 DIAGNOSIS — I452 Bifascicular block: Secondary | ICD-10-CM | POA: Diagnosis present

## 2020-10-30 DIAGNOSIS — G9341 Metabolic encephalopathy: Secondary | ICD-10-CM | POA: Diagnosis present

## 2020-10-30 DIAGNOSIS — F1490 Cocaine use, unspecified, uncomplicated: Secondary | ICD-10-CM | POA: Diagnosis not present

## 2020-10-30 DIAGNOSIS — J9610 Chronic respiratory failure, unspecified whether with hypoxia or hypercapnia: Secondary | ICD-10-CM | POA: Diagnosis not present

## 2020-10-30 DIAGNOSIS — Z9081 Acquired absence of spleen: Secondary | ICD-10-CM

## 2020-10-30 DIAGNOSIS — R4182 Altered mental status, unspecified: Secondary | ICD-10-CM | POA: Diagnosis not present

## 2020-10-30 DIAGNOSIS — Z881 Allergy status to other antibiotic agents status: Secondary | ICD-10-CM | POA: Diagnosis not present

## 2020-10-30 DIAGNOSIS — J439 Emphysema, unspecified: Secondary | ICD-10-CM | POA: Diagnosis present

## 2020-10-30 DIAGNOSIS — Y929 Unspecified place or not applicable: Secondary | ICD-10-CM

## 2020-10-30 DIAGNOSIS — G4089 Other seizures: Secondary | ICD-10-CM | POA: Diagnosis not present

## 2020-10-30 DIAGNOSIS — M069 Rheumatoid arthritis, unspecified: Secondary | ICD-10-CM | POA: Diagnosis not present

## 2020-10-30 DIAGNOSIS — Z79899 Other long term (current) drug therapy: Secondary | ICD-10-CM | POA: Diagnosis not present

## 2020-10-30 DIAGNOSIS — J157 Pneumonia due to Mycoplasma pneumoniae: Secondary | ICD-10-CM | POA: Diagnosis present

## 2020-10-30 DIAGNOSIS — Z20822 Contact with and (suspected) exposure to covid-19: Secondary | ICD-10-CM | POA: Diagnosis present

## 2020-10-30 DIAGNOSIS — Z8673 Personal history of transient ischemic attack (TIA), and cerebral infarction without residual deficits: Secondary | ICD-10-CM

## 2020-10-30 DIAGNOSIS — Z888 Allergy status to other drugs, medicaments and biological substances status: Secondary | ICD-10-CM | POA: Diagnosis not present

## 2020-10-30 DIAGNOSIS — D696 Thrombocytopenia, unspecified: Secondary | ICD-10-CM | POA: Diagnosis not present

## 2020-10-30 DIAGNOSIS — Z7952 Long term (current) use of systemic steroids: Secondary | ICD-10-CM

## 2020-10-30 DIAGNOSIS — R569 Unspecified convulsions: Secondary | ICD-10-CM | POA: Diagnosis present

## 2020-10-30 DIAGNOSIS — R32 Unspecified urinary incontinence: Secondary | ICD-10-CM | POA: Diagnosis present

## 2020-10-30 LAB — CBC WITH DIFFERENTIAL/PLATELET
Abs Immature Granulocytes: 0.09 10*3/uL — ABNORMAL HIGH (ref 0.00–0.07)
Basophils Absolute: 0.1 10*3/uL (ref 0.0–0.1)
Basophils Relative: 1 %
Eosinophils Absolute: 0 10*3/uL (ref 0.0–0.5)
Eosinophils Relative: 0 %
HCT: 42.8 % (ref 39.0–52.0)
Hemoglobin: 13.6 g/dL (ref 13.0–17.0)
Immature Granulocytes: 1 %
Lymphocytes Relative: 87 %
Lymphs Abs: 15.6 10*3/uL — ABNORMAL HIGH (ref 0.7–4.0)
MCH: 30 pg (ref 26.0–34.0)
MCHC: 31.8 g/dL (ref 30.0–36.0)
MCV: 94.3 fL (ref 80.0–100.0)
Monocytes Absolute: 1.8 10*3/uL — ABNORMAL HIGH (ref 0.1–1.0)
Monocytes Relative: 10 %
Neutro Abs: 0.1 10*3/uL — CL (ref 1.7–7.7)
Neutrophils Relative %: 1 %
Platelets: 279 10*3/uL (ref 150–400)
RBC: 4.54 MIL/uL (ref 4.22–5.81)
RDW: 19 % — ABNORMAL HIGH (ref 11.5–15.5)
WBC: 17.7 10*3/uL — ABNORMAL HIGH (ref 4.0–10.5)
nRBC: 0.3 % — ABNORMAL HIGH (ref 0.0–0.2)

## 2020-10-30 LAB — URINE DRUG SCREEN, QUALITATIVE (ARMC ONLY)
Amphetamines, Ur Screen: NOT DETECTED
Barbiturates, Ur Screen: NOT DETECTED
Benzodiazepine, Ur Scrn: POSITIVE — AB
Cannabinoid 50 Ng, Ur ~~LOC~~: NOT DETECTED
Cocaine Metabolite,Ur ~~LOC~~: POSITIVE — AB
MDMA (Ecstasy)Ur Screen: NOT DETECTED
Methadone Scn, Ur: NOT DETECTED
Opiate, Ur Screen: NOT DETECTED
Phencyclidine (PCP) Ur S: NOT DETECTED
Tricyclic, Ur Screen: NOT DETECTED

## 2020-10-30 LAB — RESP PANEL BY RT-PCR (FLU A&B, COVID) ARPGX2
Influenza A by PCR: NEGATIVE
Influenza B by PCR: NEGATIVE
SARS Coronavirus 2 by RT PCR: NEGATIVE

## 2020-10-30 LAB — ETHANOL: Alcohol, Ethyl (B): <10 mg/dL (ref <10)

## 2020-10-30 LAB — URINALYSIS, COMPLETE (UACMP) WITH MICROSCOPIC
Bacteria, UA: NONE SEEN
Bilirubin Urine: NEGATIVE
Glucose, UA: NEGATIVE mg/dL
Ketones, ur: NEGATIVE mg/dL
Leukocytes,Ua: NEGATIVE
Nitrite: NEGATIVE
Protein, ur: 100 mg/dL — AB
Specific Gravity, Urine: 1.015 (ref 1.005–1.030)
Squamous Epithelial / HPF: NONE SEEN (ref 0–5)
pH: 6 (ref 5.0–8.0)

## 2020-10-30 LAB — APTT: aPTT: 32 seconds (ref 24–36)

## 2020-10-30 LAB — BLOOD GAS, VENOUS
Acid-Base Excess: 3 mmol/L — ABNORMAL HIGH (ref 0.0–2.0)
Acid-base deficit: 6.6 mmol/L — ABNORMAL HIGH (ref 0.0–2.0)
Bicarbonate: 24 mmol/L (ref 20.0–28.0)
Bicarbonate: 31.3 mmol/L — ABNORMAL HIGH (ref 20.0–28.0)
FIO2: 100
FIO2: 0.6
MECHVT: 450 mL
O2 Saturation: 80.8 %
O2 Saturation: 84.2 %
PEEP: 5 cmH2O
Patient temperature: 37
Patient temperature: 37
RATE: 20 resp/min
pCO2, Ven: 72 mmHg (ref 44.0–60.0)
pCO2, Ven: 65 mmHg — ABNORMAL HIGH (ref 44.0–60.0)
pH, Ven: 7.13 — CL (ref 7.250–7.430)
pH, Ven: 7.29 (ref 7.250–7.430)
pO2, Ven: 60 mmHg — ABNORMAL HIGH (ref 32.0–45.0)
pO2, Ven: 55 mmHg — ABNORMAL HIGH (ref 32.0–45.0)

## 2020-10-30 LAB — LACTIC ACID, PLASMA
Lactic Acid, Venous: 10 mmol/L (ref 0.5–1.9)
Lactic Acid, Venous: 2.3 mmol/L (ref 0.5–1.9)

## 2020-10-30 LAB — COMPREHENSIVE METABOLIC PANEL
ALT: 21 U/L (ref 0–44)
AST: 39 U/L (ref 15–41)
Albumin: 3.1 g/dL — ABNORMAL LOW (ref 3.5–5.0)
Alkaline Phosphatase: 240 U/L — ABNORMAL HIGH (ref 38–126)
Anion gap: 16 — ABNORMAL HIGH (ref 5–15)
BUN: 13 mg/dL (ref 6–20)
CO2: 22 mmol/L (ref 22–32)
Calcium: 8.5 mg/dL — ABNORMAL LOW (ref 8.9–10.3)
Chloride: 95 mmol/L — ABNORMAL LOW (ref 98–111)
Creatinine, Ser: 0.73 mg/dL (ref 0.61–1.24)
GFR, Estimated: >60 mL/min (ref >60)
Glucose, Bld: 117 mg/dL — ABNORMAL HIGH (ref 70–99)
Potassium: 3.9 mmol/L (ref 3.5–5.1)
Sodium: 133 mmol/L — ABNORMAL LOW (ref 135–145)
Total Bilirubin: 0.8 mg/dL (ref 0.3–1.2)
Total Protein: 7.5 g/dL (ref 6.5–8.1)

## 2020-10-30 LAB — PROTIME-INR
INR: 1.2 (ref 0.8–1.2)
Prothrombin Time: 14.7 seconds (ref 11.4–15.2)

## 2020-10-30 LAB — AMMONIA: Ammonia: 51 umol/L — ABNORMAL HIGH (ref 9–35)

## 2020-10-30 LAB — TROPONIN I (HIGH SENSITIVITY)
Troponin I (High Sensitivity): 28 ng/L — ABNORMAL HIGH (ref <18)
Troponin I (High Sensitivity): 81 ng/L — ABNORMAL HIGH (ref <18)

## 2020-10-30 IMAGING — DX DG CHEST 1V PORT
2 series · 2 of 2 positions shown · non-contrast
Comparison: Radiograph [DATE], CT [DATE]

CLINICAL DATA: Intubation

EXAM:
PORTABLE CHEST 1 VIEW

[chest ap (1 of 2)]
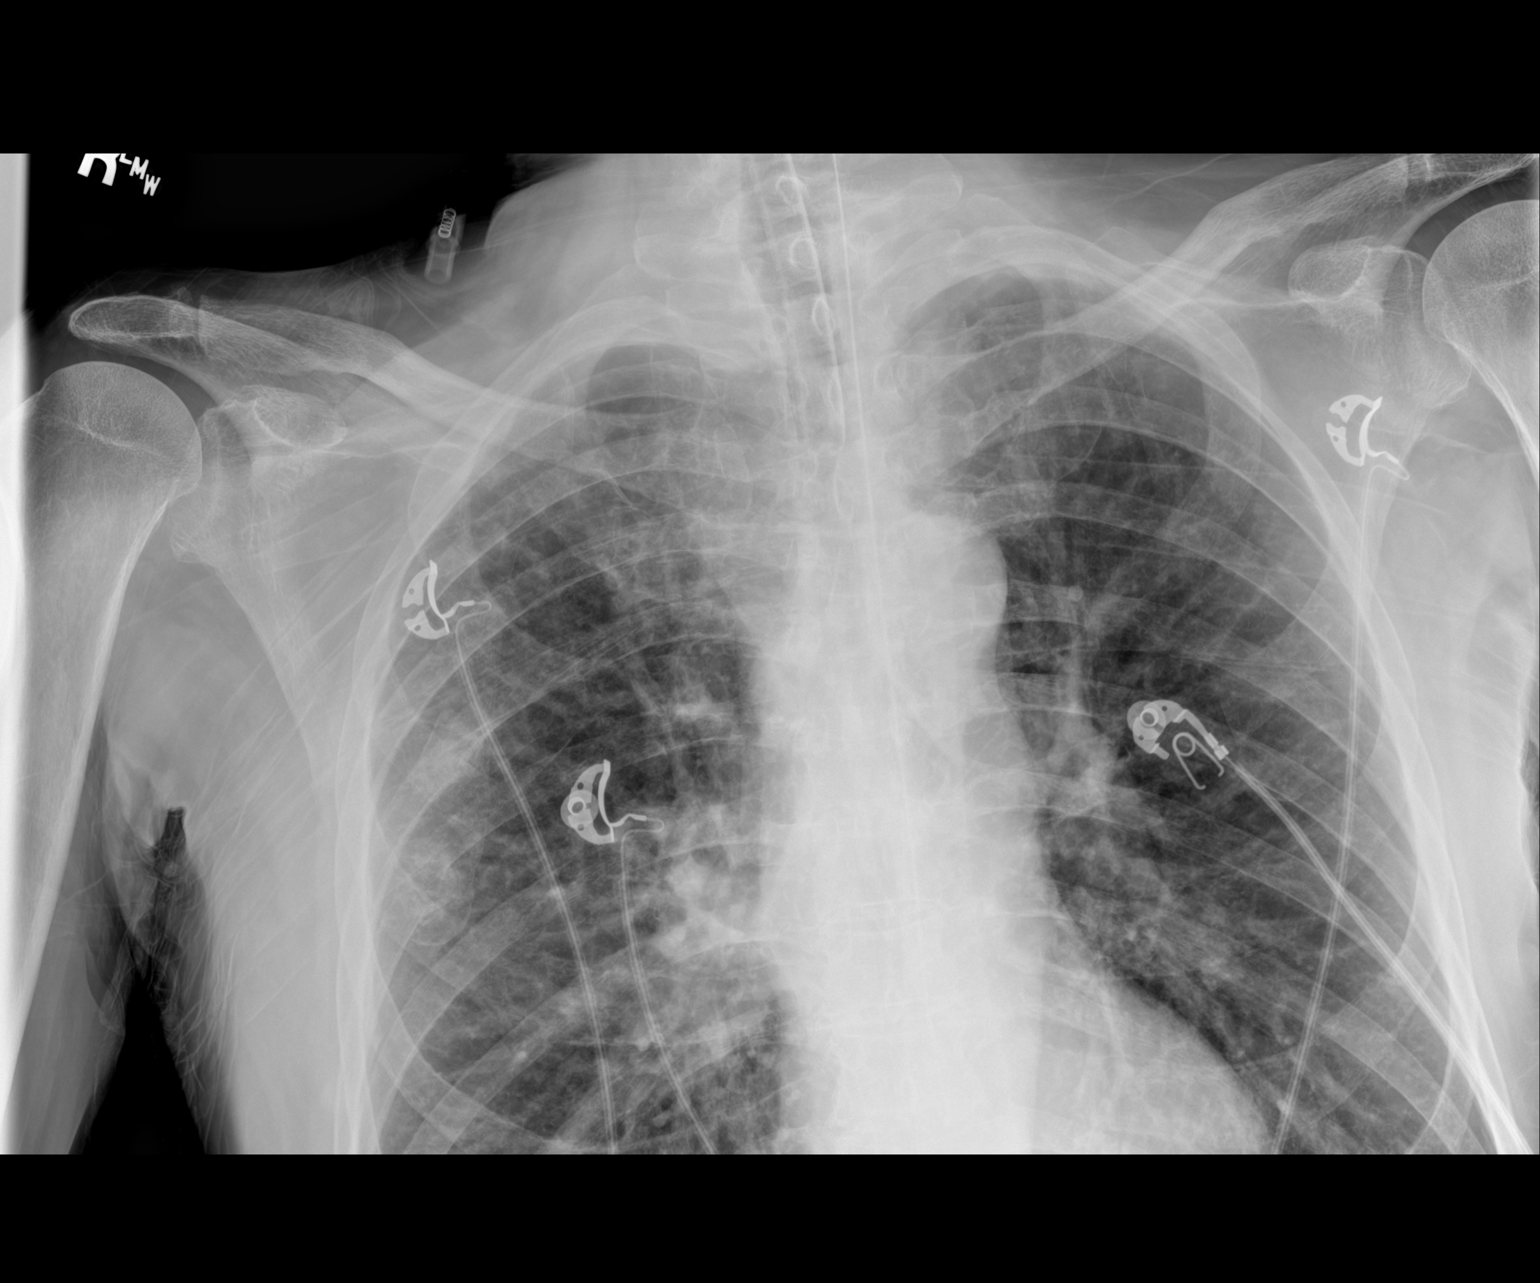

[chest ap (2 of 2)]
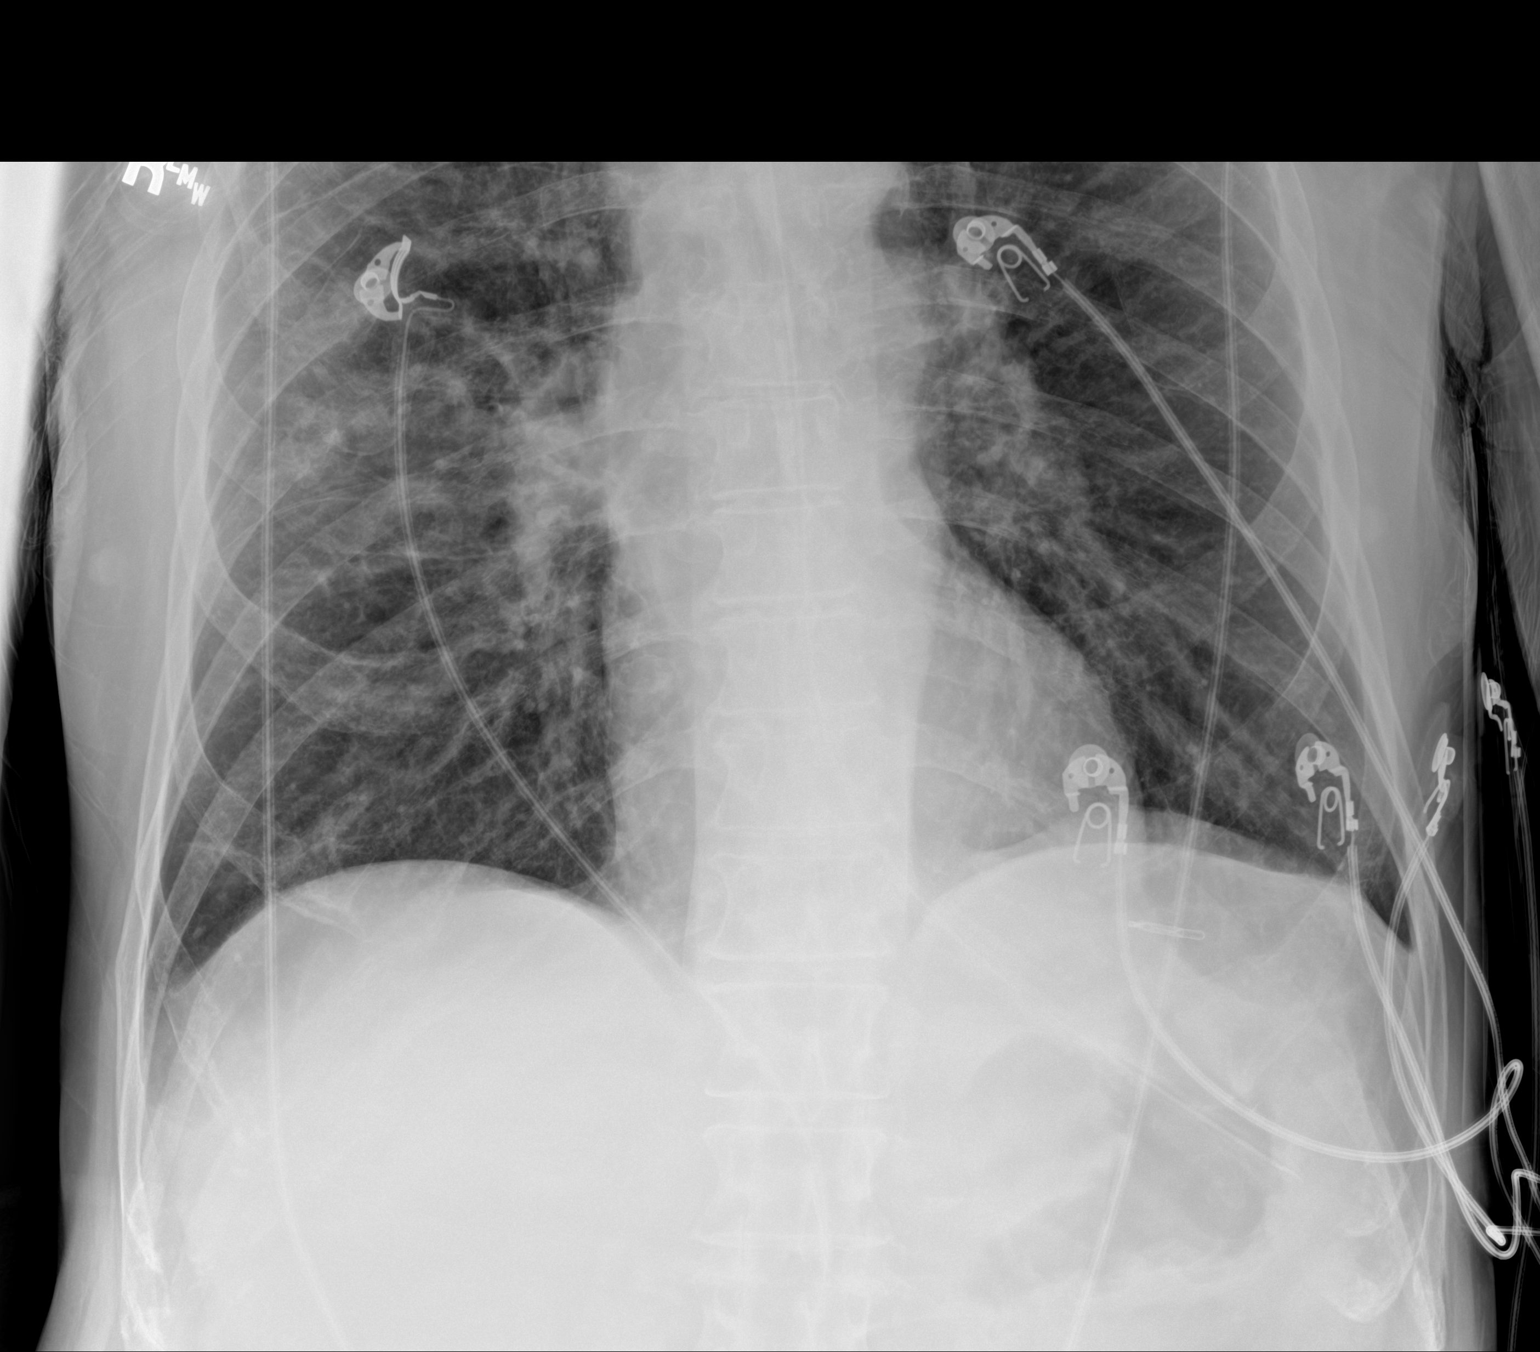

[2 of 2 positions shown; findings below may reference images not displayed]

FINDINGS: Endotracheal tube tip terminates in the midtrachea, 6 cm from the
carina. Transesophageal tube tip and side port terminate in the left
upper quadrant, beyond the GE junction. Telemetry leads overlie the
chest.

Residual coarse reticulonodular opacities are seen in the right
upper lung with some persistent cavitary lucency centrally
corresponding to the region of a previously seen cavitary focus on
comparison prior CT. Some further scarring and architectural
distortion is noted in the right lung base. Lungs are otherwise
clear. The aorta is calcified. The remaining cardiomediastinal
contours are unremarkable. No acute osseous or soft tissue
abnormality. Degenerative changes are present in the imaged spine
and shoulders.
IMPRESSION: Endotracheal tube tip terminates in the mid trachea.

Transesophageal tube tip terminates within the gastric lumen, side
port distal to the GE junction.

Some persisting cavitary lesion in the medial right lung with
reticulonodular opacities in the right upper lung, similar to
diminished from comparison radiography.

## 2020-10-30 IMAGING — CT CT HEAD W/O CM
3 series · 16 of 47 positions shown, 19 images · non-contrast
Comparison: MR [DATE]

CLINICAL DATA: Altered mental status, seizure like activity

EXAM:
CT HEAD WITHOUT CONTRAST
TECHNIQUE: Contiguous axial images were obtained from the base of the skull
through the vertex without intravenous contrast.

[Series 2: head wo · axial · 0.42mm/px · z∈[-177,-27]mm · 10 of 36 slices shown, 13 images]
[im 3/36  brain]
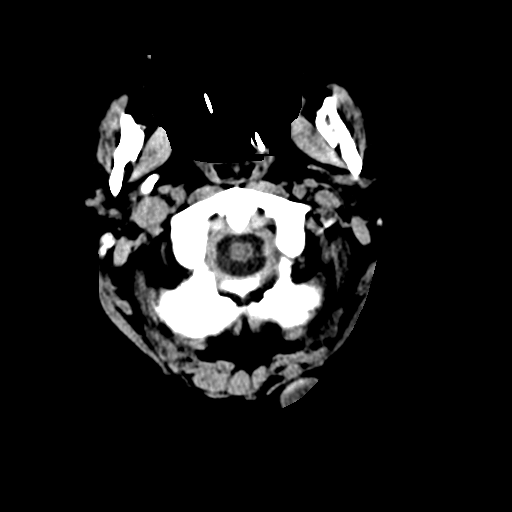
[im 3/36  bone]
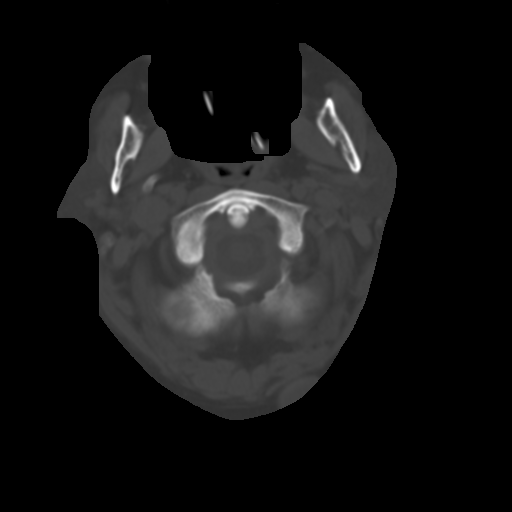
[im 7/36  brain]
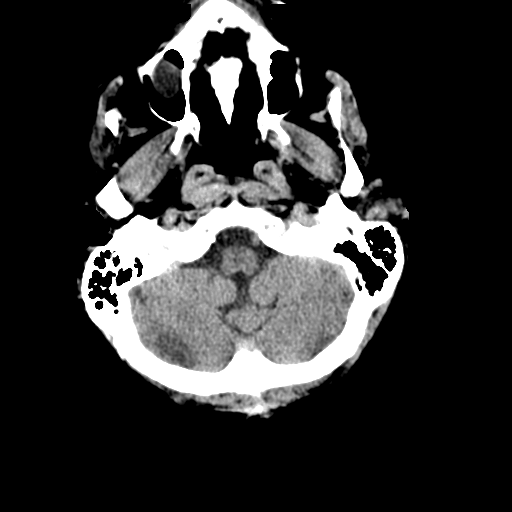
[im 10/36  brain]
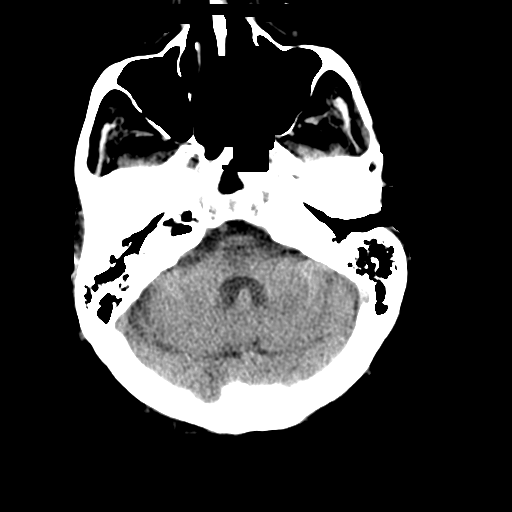
[im 13/36  brain]
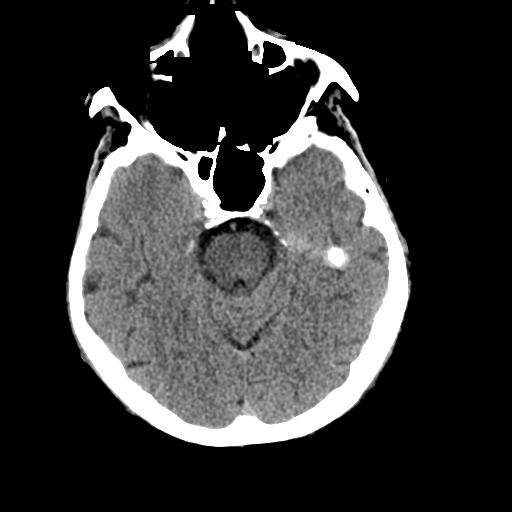
[im 16/36  brain]
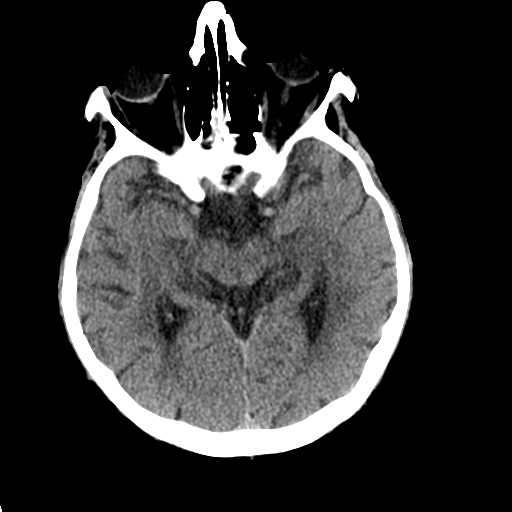
[im 16/36  bone]
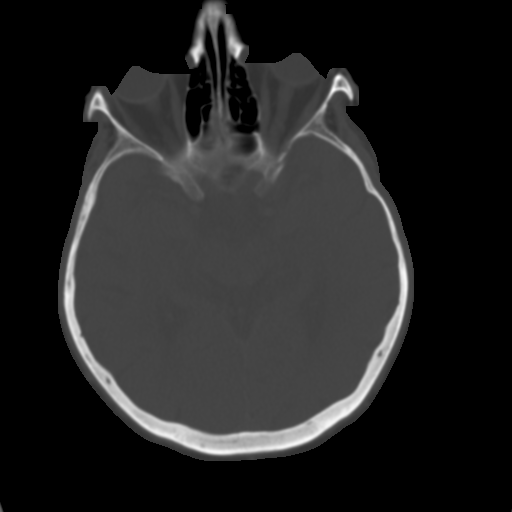
[im 20/36  brain]
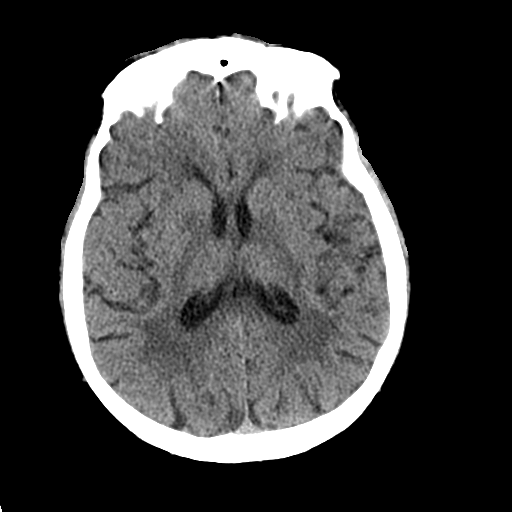
[im 23/36  brain]
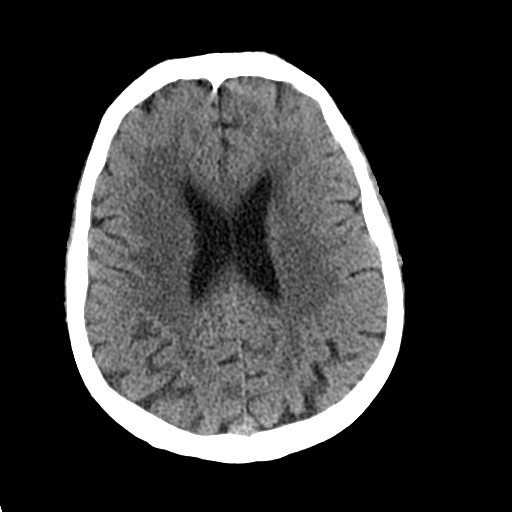
[im 27/36  brain]
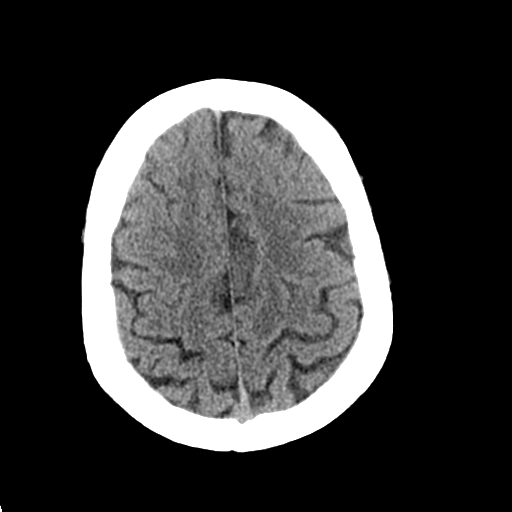
[im 29/36  brain]
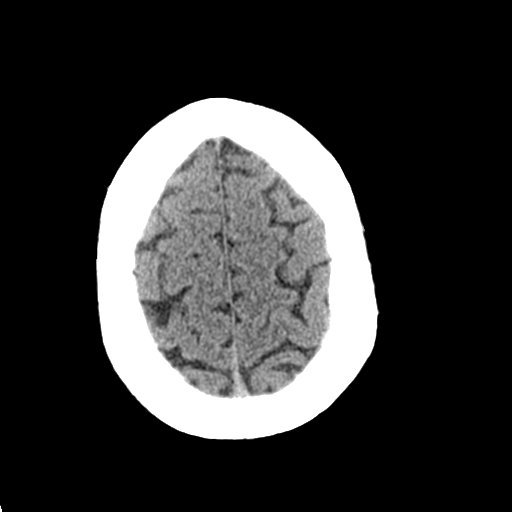
[im 29/36  bone]
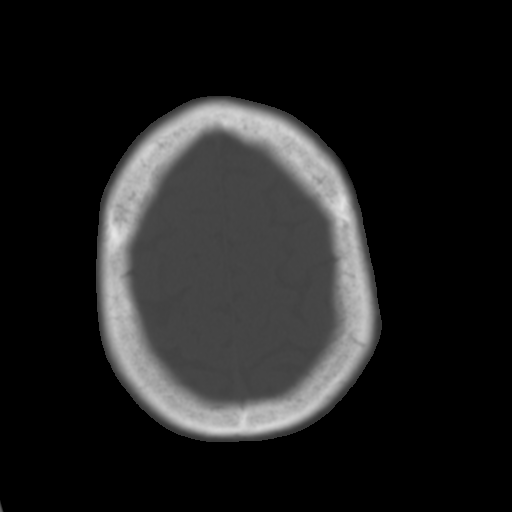
[im 33/36  brain]
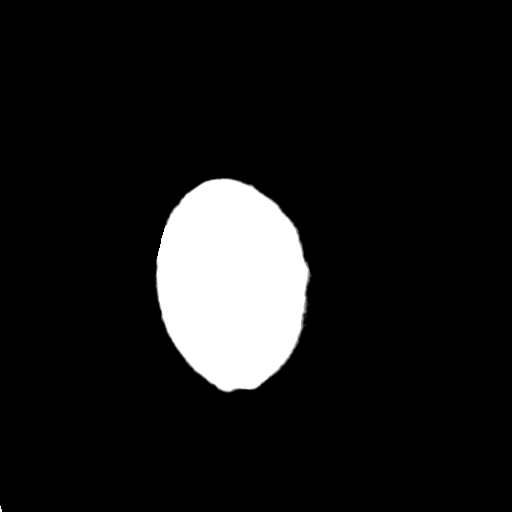

[Series 4: coronal soft tissue · coronal · 0.36mm/px · 3 of 69 slices shown]
[im 23/69  brain]
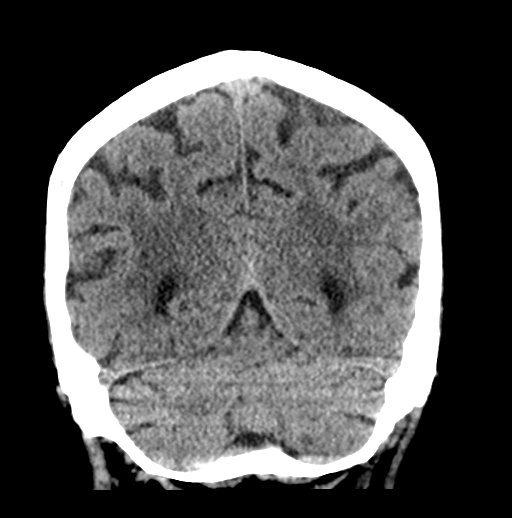
[im 31/69  brain]
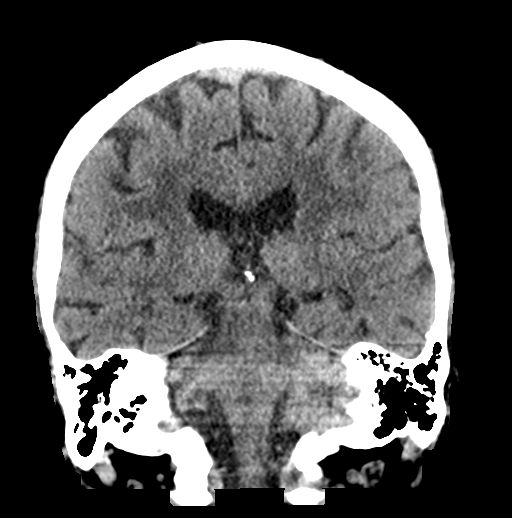
[im 38/69  brain]
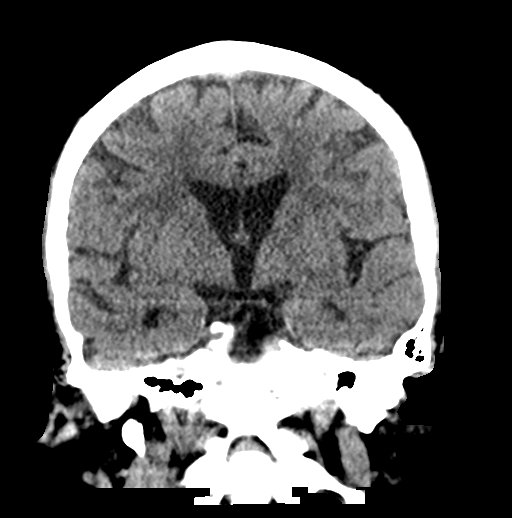

[Series 5: sagittal soft tissue · sagittal · 0.37mm/px · 3 of 58 slices shown]
[im 20/58  brain]
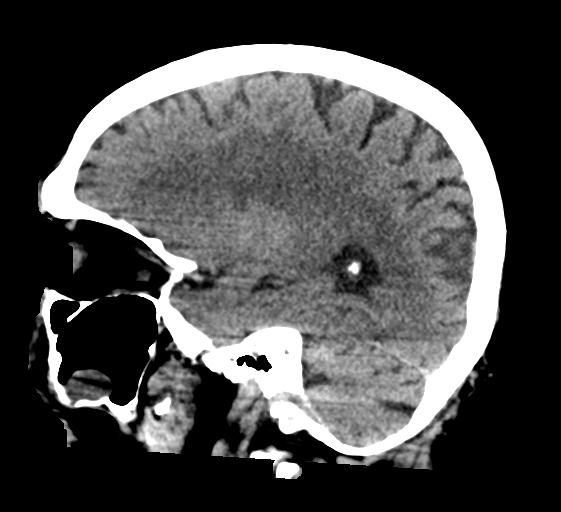
[im 29/58  brain]
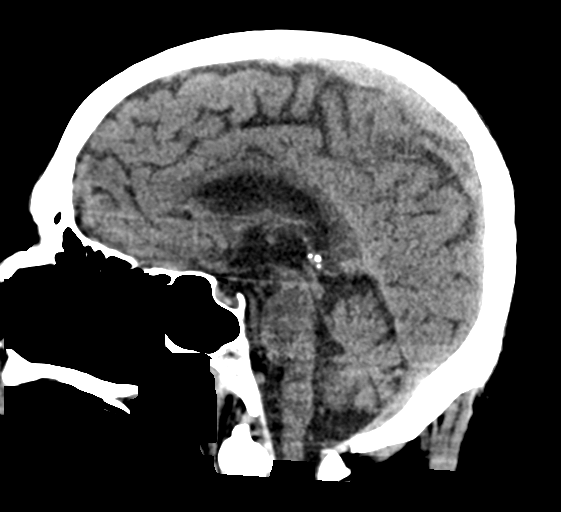
[im 39/58  brain]
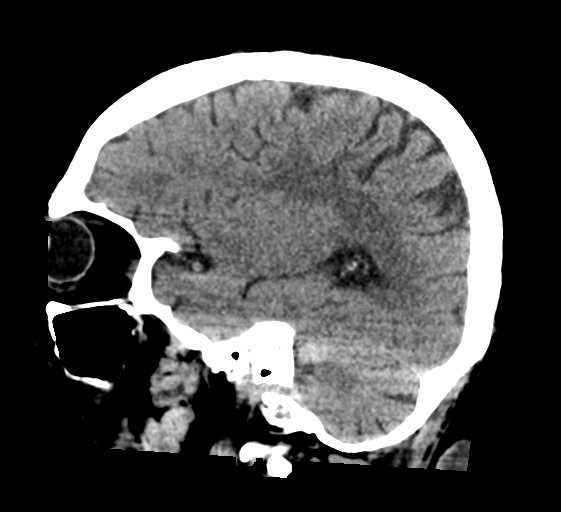

[16 of 47 positions shown; findings below may reference images not displayed]

FINDINGS: Brain: Remote appearing coded type infarct basal ganglia. Patchy
areas of white matter hypoattenuation are most compatible with
chronic microvascular angiopathy. No CT evident areas of new
cortically based or large vascular territory infarct. Symmetric
prominence of the ventricles, cisterns and sulci compatible with
parenchymal volume loss. No evidence of acute hemorrhage,
hydrocephalus, extra-axial collection, visible mass lesion or mass
effect.

Vascular: Atherosclerotic calcification of the carotid siphons. No
hyperdense vessel.

Skull: Stable size of a fluid attenuation left suboccipital cystic
focus in the subcutaneous tissues ([DATE]). No acute scalp swelling or
hematoma. No calvarial fracture or worrisome osseous lesion.

Sinuses/Orbits: Minimal nodular mural thickening in the right
maxillary sinus. No layering air-fluid levels or pneumatized
secretions. Mastoid air cells are clear. Middle ear cavities are
clear. Included orbital structures are unremarkable.

Other: Patient intubated at the time of exam.
IMPRESSION: 1. No acute intracranial abnormality.
2. Stable region of lacunar infarct in the left basal ganglia.
3. Background of parenchymal volume loss, microvascular angiopathy
and intracranial atherosclerosis.
4. Stable subcutaneous cystic nodule in the left suboccipital
tissues.

## 2020-10-30 MED ORDER — FENTANYL CITRATE (PF) 100 MCG/2ML IJ SOLN: 50.0000 ug | INTRAMUSCULAR | Status: DC | PRN

## 2020-10-30 MED ORDER — SODIUM CHLORIDE 0.9 % IV SOLN: 2.0000 g | Freq: Once | INTRAVENOUS | Status: DC

## 2020-10-30 MED ORDER — SODIUM CHLORIDE 0.9 % IV SOLN
2.0000 g | Freq: Once | INTRAVENOUS | Status: AC
  Administered 2020-10-30: 2 g via INTRAVENOUS
  Filled 2020-10-30: qty 2

## 2020-10-30 MED ORDER — ETOMIDATE 2 MG/ML IV SOLN
30.0000 mg | Freq: Once | INTRAVENOUS | Status: AC
  Administered 2020-10-30: 30 mg via INTRAVENOUS

## 2020-10-30 MED ORDER — METRONIDAZOLE 500 MG/100ML IV SOLN
500.0000 mg | Freq: Three times a day (TID) | INTRAVENOUS | Status: DC
  Administered 2020-10-31 (×2): 500 mg via INTRAVENOUS
  Filled 2020-10-30 (×4): qty 100

## 2020-10-30 MED ORDER — ROCURONIUM BROMIDE 50 MG/5ML IV SOLN
30.0000 mg | Freq: Once | INTRAVENOUS | Status: AC
  Administered 2020-10-30: 30 mg via INTRAVENOUS

## 2020-10-30 MED ORDER — VANCOMYCIN HCL IN DEXTROSE 1-5 GM/200ML-% IV SOLN
1000.0000 mg | Freq: Once | INTRAVENOUS | Status: AC
  Administered 2020-10-30: 1000 mg via INTRAVENOUS
  Filled 2020-10-30: qty 200

## 2020-10-30 MED ORDER — METRONIDAZOLE 500 MG/100ML IV SOLN
500.0000 mg | Freq: Once | INTRAVENOUS | Status: AC
  Administered 2020-10-30: 500 mg via INTRAVENOUS
  Filled 2020-10-30: qty 100

## 2020-10-30 MED ORDER — VANCOMYCIN HCL IN DEXTROSE 1-5 GM/200ML-% IV SOLN: 1000.0000 mg | Freq: Once | INTRAVENOUS | Status: DC

## 2020-10-30 MED ORDER — LACTATED RINGERS IV BOLUS
1000.0000 mL | Freq: Once | INTRAVENOUS | Status: AC
  Administered 2020-10-30: 1000 mL via INTRAVENOUS

## 2020-10-30 MED ORDER — PROPOFOL 1000 MG/100ML IV EMUL
5.0000 ug/kg/min | INTRAVENOUS | Status: DC
  Administered 2020-10-30 (×2): 5 ug/kg/min via INTRAVENOUS
  Filled 2020-10-30: qty 100

## 2020-10-30 MED ORDER — DOCUSATE SODIUM 100 MG PO CAPS: 100.0000 mg | ORAL_CAPSULE | Freq: Two times a day (BID) | ORAL | Status: DC | PRN

## 2020-10-30 MED ORDER — MIDAZOLAM 50MG/50ML (1MG/ML) PREMIX INFUSION
0.0000 mg/h | INTRAVENOUS | Status: DC
  Administered 2020-10-30: 2 mg/h via INTRAVENOUS
  Administered 2020-10-31: 4 mg/h via INTRAVENOUS
  Filled 2020-10-30 (×2): qty 50

## 2020-10-30 MED ORDER — POLYETHYLENE GLYCOL 3350 17 G PO PACK: 17.0000 g | PACK | Freq: Every day | ORAL | Status: DC | PRN

## 2020-10-30 MED ORDER — LACTATED RINGERS IV BOLUS (SEPSIS)
500.0000 mL | Freq: Once | INTRAVENOUS | Status: AC
  Administered 2020-10-30: 500 mL via INTRAVENOUS
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
## 2020-10-30 NOTE — Sepsis Progress Note (Signed)
Code Sepsis initiated @ 2250 PM. ELINK following.

## 2020-10-30 NOTE — ED Notes (Signed)
Intubation initiated at 2042 by Dr. Archie Balboa  '30mg'$  of Roc given by Annie Main, RN @ 2042 '30mg'$  of Etomidate given by Annie Main, RN '@2043'$ .  Tube secured at 24cm @ the lip by Danae Chen, RT - XR at the bedside.   16 Fr. OG placed by Terri Piedra, RN at 2045 - secured at 51cm @ the lip.   This RN and Danae Chen, RT to transport patient to CT monitored & on vent. Arrived at 2054. Returned back to the room at 2106.
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
## 2020-10-30 NOTE — ED Notes (Signed)
Pt provided a partial bed bath with the assistance of RT for airway support.   Pts shoes, black shirt, red pants, and brown wallet placed in Pt belongings bag.

## 2020-10-30 NOTE — ED Notes (Signed)
Pts brother Jeneen Rinks) updated.

## 2020-10-31 ENCOUNTER — Inpatient Hospital Stay: Payer: Self-pay

## 2020-10-31 ENCOUNTER — Inpatient Hospital Stay (HOSPITAL_COMMUNITY)
Admit: 2020-10-31 | Discharge: 2020-10-31 | Disposition: A | Discharge status: Home / Self Care | Payer: Medicaid Other | Attending: Nurse Practitioner | Admitting: Nurse Practitioner

## 2020-10-31 DIAGNOSIS — J851 Abscess of lung with pneumonia: Secondary | ICD-10-CM

## 2020-10-31 DIAGNOSIS — F1490 Cocaine use, unspecified, uncomplicated: Secondary | ICD-10-CM | POA: Diagnosis not present

## 2020-10-31 DIAGNOSIS — G4089 Other seizures: Secondary | ICD-10-CM | POA: Diagnosis not present

## 2020-10-31 DIAGNOSIS — R4182 Altered mental status, unspecified: Secondary | ICD-10-CM

## 2020-10-31 DIAGNOSIS — Z9981 Dependence on supplemental oxygen: Secondary | ICD-10-CM

## 2020-10-31 DIAGNOSIS — R569 Unspecified convulsions: Secondary | ICD-10-CM | POA: Diagnosis not present

## 2020-10-31 DIAGNOSIS — I38 Endocarditis, valve unspecified: Secondary | ICD-10-CM

## 2020-10-31 DIAGNOSIS — J9610 Chronic respiratory failure, unspecified whether with hypoxia or hypercapnia: Secondary | ICD-10-CM | POA: Diagnosis not present

## 2020-10-31 DIAGNOSIS — F1410 Cocaine abuse, uncomplicated: Secondary | ICD-10-CM | POA: Diagnosis not present

## 2020-10-31 DIAGNOSIS — M0500 Felty's syndrome, unspecified site: Secondary | ICD-10-CM

## 2020-10-31 DIAGNOSIS — D696 Thrombocytopenia, unspecified: Secondary | ICD-10-CM

## 2020-10-31 DIAGNOSIS — F17210 Nicotine dependence, cigarettes, uncomplicated: Secondary | ICD-10-CM

## 2020-10-31 DIAGNOSIS — M069 Rheumatoid arthritis, unspecified: Secondary | ICD-10-CM

## 2020-10-31 DIAGNOSIS — J9601 Acute respiratory failure with hypoxia: Secondary | ICD-10-CM | POA: Diagnosis not present

## 2020-10-31 LAB — CBC
HCT: 37.5 % — ABNORMAL LOW (ref 39.0–52.0)
Hemoglobin: 12.2 g/dL — ABNORMAL LOW (ref 13.0–17.0)
MCH: 29.8 pg (ref 26.0–34.0)
MCHC: 32.5 g/dL (ref 30.0–36.0)
MCV: 91.5 fL (ref 80.0–100.0)
Platelets: 270 10*3/uL (ref 150–400)
RBC: 4.1 MIL/uL — ABNORMAL LOW (ref 4.22–5.81)
RDW: 18.3 % — ABNORMAL HIGH (ref 11.5–15.5)
WBC: 3.8 10*3/uL — ABNORMAL LOW (ref 4.0–10.5)
nRBC: 0 % (ref 0.0–0.2)

## 2020-10-31 LAB — PATHOLOGIST SMEAR REVIEW

## 2020-10-31 LAB — ECHOCARDIOGRAM COMPLETE
AR max vel: 2.84 cm2
AV Area VTI: 2.96 cm2
AV Area mean vel: 2.72 cm2
AV Mean grad: 2 mmHg
AV Peak grad: 4.3 mmHg
Ao pk vel: 1.04 m/s
Area-P 1/2: 6.77 cm2
Height: 69.016 in
MV VTI: 2.87 cm2
S' Lateral: 3.44 cm
Weight: 2151.69 oz

## 2020-10-31 LAB — MAGNESIUM: Magnesium: 2 mg/dL (ref 1.7–2.4)

## 2020-10-31 LAB — APTT: aPTT: 33 seconds (ref 24–36)

## 2020-10-31 LAB — BASIC METABOLIC PANEL
Anion gap: 9 (ref 5–15)
BUN: 12 mg/dL (ref 6–20)
CO2: 28 mmol/L (ref 22–32)
Calcium: 8.2 mg/dL — ABNORMAL LOW (ref 8.9–10.3)
Chloride: 99 mmol/L (ref 98–111)
Creatinine, Ser: 0.55 mg/dL — ABNORMAL LOW (ref 0.61–1.24)
GFR, Estimated: >60 mL/min (ref >60)
Glucose, Bld: 123 mg/dL — ABNORMAL HIGH (ref 70–99)
Potassium: 3.5 mmol/L (ref 3.5–5.1)
Sodium: 136 mmol/L (ref 135–145)

## 2020-10-31 LAB — GLUCOSE, CAPILLARY
Glucose-Capillary: 117 mg/dL — ABNORMAL HIGH (ref 70–99)
Glucose-Capillary: 170 mg/dL — ABNORMAL HIGH (ref 70–99)
Glucose-Capillary: 173 mg/dL — ABNORMAL HIGH (ref 70–99)
Glucose-Capillary: 197 mg/dL — ABNORMAL HIGH (ref 70–99)
Glucose-Capillary: 71 mg/dL (ref 70–99)
Glucose-Capillary: 86 mg/dL (ref 70–99)
Glucose-Capillary: 86 mg/dL (ref 70–99)
Glucose-Capillary: 92 mg/dL (ref 70–99)

## 2020-10-31 LAB — PROCALCITONIN
Procalcitonin: 0.22 ng/mL
Procalcitonin: 0.32 ng/mL

## 2020-10-31 LAB — CORTISOL: Cortisol, Plasma: 10.1 ug/dL

## 2020-10-31 LAB — TYPE AND SCREEN
ABO/RH(D): A POS
Antibody Screen: NEGATIVE

## 2020-10-31 LAB — STREP PNEUMONIAE URINARY ANTIGEN: Strep Pneumo Urinary Antigen: NEGATIVE

## 2020-10-31 LAB — MRSA NEXT GEN BY PCR, NASAL: MRSA by PCR Next Gen: NOT DETECTED

## 2020-10-31 LAB — PHOSPHORUS: Phosphorus: 4.5 mg/dL (ref 2.5–4.6)

## 2020-10-31 MED ORDER — NOREPINEPHRINE 4 MG/250ML-% IV SOLN
0.0000 ug/min | INTRAVENOUS | Status: DC
  Administered 2020-10-31: 5 ug/min via INTRAVENOUS

## 2020-10-31 MED ORDER — ORAL CARE MOUTH RINSE: 15.0000 mL | Freq: Two times a day (BID) | OROMUCOSAL | Status: DC

## 2020-10-31 MED ORDER — CHLORHEXIDINE GLUCONATE CLOTH 2 % EX PADS
6.0000 | MEDICATED_PAD | Freq: Every day | CUTANEOUS | Status: DC
  Administered 2020-11-02: 6 via TOPICAL

## 2020-10-31 MED ORDER — TBO-FILGRASTIM 480 MCG/0.8ML ~~LOC~~ SOSY
480.0000 ug | PREFILLED_SYRINGE | Freq: Every day | SUBCUTANEOUS | Status: DC
  Administered 2020-10-31 – 2020-11-01 (×2): 480 ug via SUBCUTANEOUS
  Filled 2020-10-31 (×3): qty 0.8

## 2020-10-31 MED ORDER — VANCOMYCIN HCL IN DEXTROSE 1-5 GM/200ML-% IV SOLN
1000.0000 mg | Freq: Two times a day (BID) | INTRAVENOUS | Status: DC
  Administered 2020-10-31 – 2020-11-01 (×2): 1000 mg via INTRAVENOUS
  Filled 2020-10-31 (×4): qty 200

## 2020-10-31 MED ORDER — NOREPINEPHRINE 4 MG/250ML-% IV SOLN
INTRAVENOUS | Status: AC
  Filled 2020-10-31: qty 250

## 2020-10-31 MED ORDER — VANCOMYCIN HCL 750 MG/150ML IV SOLN
750.0000 mg | Freq: Two times a day (BID) | INTRAVENOUS | Status: DC
  Administered 2020-10-31: 750 mg via INTRAVENOUS
  Filled 2020-10-31 (×2): qty 150

## 2020-10-31 MED ORDER — SODIUM CHLORIDE 0.9 % IV SOLN
2.0000 g | Freq: Three times a day (TID) | INTRAVENOUS | Status: DC
  Administered 2020-10-31: 2 g via INTRAVENOUS
  Filled 2020-10-31 (×3): qty 2

## 2020-10-31 MED ORDER — HEPARIN SODIUM (PORCINE) 5000 UNIT/ML IJ SOLN
5000.0000 [IU] | Freq: Three times a day (TID) | INTRAMUSCULAR | Status: DC
  Administered 2020-10-31 – 2020-11-02 (×6): 5000 [IU] via SUBCUTANEOUS
  Filled 2020-10-31 (×6): qty 1

## 2020-10-31 MED ORDER — CHLORHEXIDINE GLUCONATE 0.12% ORAL RINSE (MEDLINE KIT)
15.0000 mL | Freq: Two times a day (BID) | OROMUCOSAL | Status: DC
  Administered 2020-10-31: 15 mL via OROMUCOSAL

## 2020-10-31 MED ORDER — NICOTINE 21 MG/24HR TD PT24
21.0000 mg | MEDICATED_PATCH | Freq: Every day | TRANSDERMAL | Status: DC
  Administered 2020-11-01 – 2020-11-03 (×3): 21 mg via TRANSDERMAL
  Filled 2020-10-31 (×3): qty 1

## 2020-10-31 MED ORDER — PANTOPRAZOLE SODIUM 40 MG IV SOLR
40.0000 mg | Freq: Every day | INTRAVENOUS | Status: DC
  Administered 2020-10-31 – 2020-11-02 (×3): 40 mg via INTRAVENOUS
  Filled 2020-10-31 (×3): qty 40

## 2020-10-31 MED ORDER — INSULIN ASPART 100 UNIT/ML IJ SOLN
0.0000 [IU] | INTRAMUSCULAR | Status: DC
  Administered 2020-10-31: 2 [IU] via SUBCUTANEOUS
  Filled 2020-10-31: qty 1

## 2020-10-31 MED ORDER — ORAL CARE MOUTH RINSE
15.0000 mL | OROMUCOSAL | Status: DC
  Administered 2020-10-31 (×4): 15 mL via OROMUCOSAL

## 2020-10-31 MED ORDER — SODIUM CHLORIDE 0.9 % IV SOLN
2.0000 g | INTRAVENOUS | Status: DC
  Administered 2020-10-31: 2 g via INTRAVENOUS
  Filled 2020-10-31: qty 2
  Filled 2020-10-31: qty 20

## 2020-10-31 MED ORDER — METHYLPREDNISOLONE SODIUM SUCC 125 MG IJ SOLR
60.0000 mg | Freq: Once | INTRAMUSCULAR | Status: AC
  Administered 2020-10-31: 60 mg via INTRAVENOUS
  Filled 2020-10-31: qty 2

## 2020-10-31 MED ORDER — BUDESONIDE 0.25 MG/2ML IN SUSP
0.2500 mg | Freq: Two times a day (BID) | RESPIRATORY_TRACT | Status: DC
  Administered 2020-10-31 – 2020-11-03 (×7): 0.25 mg via RESPIRATORY_TRACT
  Filled 2020-10-31 (×6): qty 2

## 2020-10-31 MED ORDER — ORAL CARE MOUTH RINSE
15.0000 mL | Freq: Two times a day (BID) | OROMUCOSAL | Status: DC
  Administered 2020-11-01 – 2020-11-03 (×4): 15 mL via OROMUCOSAL

## 2020-10-31 MED ORDER — IPRATROPIUM-ALBUTEROL 0.5-2.5 (3) MG/3ML IN SOLN
3.0000 mL | Freq: Four times a day (QID) | RESPIRATORY_TRACT | Status: DC | PRN
  Administered 2020-10-31 – 2020-11-01 (×2): 3 mL via RESPIRATORY_TRACT
  Filled 2020-10-31 (×2): qty 3

## 2020-10-31 NOTE — Progress Notes (Signed)
Pt successfully extubated and placed on 2L O2 via nasal canula with O2 sats '@92'$ % no s/sx of respiratory distress and pt able to state his name.  Will continue to monitor and assess pt.   Rosilyn Mings, AGNP  Pulmonary/Critical Care Pager [phone] (please enter 7 digits) PCCM Consult Pager [phone] (please enter 7 digits)

## 2020-10-31 NOTE — Consult Note (Addendum)
Pharmacy Antibiotic Note  Marcus [name] is a 60 y.o. male admitted on 10/30/2020 with sepsis/unknown source.    Pharmacy has been consulted for vancomycin/cefepime dosing.  Plan: Pt given '100mg'$  Vancomcyin loading dose   - will follow with Vancomcyin '750mg'$  q12h Goal AUC 400-550. Expected AUC: 459 SCr used: 0.8   Will start Cefepime 2g q8h  Height: 5' 9.02" (175.3 cm) Weight: 60.5 kg (133 lb 6.1 oz) IBW/kg (Calculated) : 70.74  Temp (24hrs), Avg:101.3 F (38.5 C), Min:100.3 F (37.9 C), Max:102.1 F (38.9 C)  Recent Labs  Lab 10/30/20 2048 10/30/20 2247  WBC 17.7*  --   CREATININE 0.73  --   LATICACIDVEN 10.0* 2.3*    Estimated Creatinine Clearance: 85.1 mL/min (by C-G formula based on SCr of 0.73 mg/dL).    Allergies  Allergen Reactions   Levaquin [Levofloxacin In D5w] Other (See Comments)    Chest pain   Methotrexate Other (See Comments)    Chest pain    Antimicrobials this admission: Metronidazole 7/26 >> Vancomcyin 7/26 >> Cefepime 7/26 >>  Dose adjustments this admission: N/A  Microbiology results: 7/26 BCx: pending COVID/FLU NEG   Thank you for allowing pharmacy to be a part of this patient's care.  Lu Duffel, PharmD, BCPS Clinical Pharmacist 10/31/2020 12:34 AM

## 2020-10-31 NOTE — Consult Note (Signed)
PHARMACY CONSULT NOTE  Pharmacy Consult for Electrolyte Monitoring and Replacement   Recent Labs: Potassium (mmol/L)  Date Value  10/31/2020 3.5   Magnesium (mg/dL)  Date Value  10/31/2020 2.0   Calcium (mg/dL)  Date Value  10/31/2020 8.2 (L)   Albumin (g/dL)  Date Value  10/30/2020 3.1 (L)   Phosphorus (mg/dL)  Date Value  10/31/2020 4.5   Sodium (mmol/L)  Date Value  10/31/2020 136   Assessment: Patient is a 60 y/o M with medical history including Felty's syndrome, cavitary pneumonia, chronic respiratory failure, tobacco abuse, GIB / esophageal varices, history of splenectomy, RA who is admitted with acute respiratory failure, sepsis, drug intoxication, and possible seizure activity. Pharmacy consulted to assist with electrolyte monitoring and replacement as indicated.  Patient extubated 7/27  Goal of Therapy:  Electrolytes within normal limits  Plan:  --No electrolyte replacement warranted at this time --Follow-up electrolytes with AM labs tomorrow --Patient is a high re-feed risk per RD assessment  Benita Gutter 10/31/2020 1:34 PM

## 2020-10-31 NOTE — Assessment & Plan Note (Addendum)
#  60 year old male patient with history of rheumatoid arthritis/Felty syndrome/LGL leukemia; currently admitted hospital for seizure episode/altered mental status needing intubation.  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  Patient neutrophil count 7.6 status post 2 doses of Granix.  Hold Granix for now.  Restarted Granix if ANC less than 1500.  Patient will need outpatient follow-up for chronic outpatient Granix every 1 to 2 weeks.  Also will need further work-up for his LGL leukemia/treatment options.  #MRSA-lung abscess s/p IV vancomycin-worsening noted question underlying fungal infection-antibiotics as per primary team.  #Seizure activity/altered mental status needing intubation-sedation s/p extubation.  UDS positive for cocaine.

## 2020-10-31 NOTE — ED Notes (Signed)
Phlebotomy at the bedside obtaining labs

## 2020-10-31 NOTE — Progress Notes (Signed)
Eeg done 

## 2020-10-31 NOTE — Sepsis Progress Note (Signed)
[place] Code Sepsis Completion Note:  Pt had BC drawn and ABX administered. Only had approx 1500 ml IVF resuscitation because pt was in respiratory failure, intubated and admitted to the ICU for further monitoring.  LA down from 10 to 2.3.  Tosh Glaze DNP Warren Lacy RN [phone] AM

## 2020-10-31 NOTE — Progress Notes (Signed)
*  PRELIMINARY RESULTS* Echocardiogram 2D Echocardiogram has been performed.  Marcus [name] 10/31/2020, 9:12 AM

## 2020-10-31 NOTE — H&P (Signed)
NAME:  Marcus [name], MRN:  [id number], DOB:  11/08/1960, LOS: 1 ADMISSION DATE:  10/30/2020, CONSULTATION DATE:  10/30/2020 REFERRING MD: Derrill Kay MD, CHIEF COMPLAINT: Seizure like Activity   HPI  60 y.o with significant PMH as below who presented to the ED with seizure like activity.  Per EMS run sheet,  patient was driving when he had a seizure like activity per bystander. On EMS arrival to the scene, patient was lying out the side of the vehicle unresponsive with rapid, shallow, and ineffective with decorticate posturing and [place]ul respirations. Pt's skin was pale and cyanosis noted. Bystander on scene advised Pt was driving when he presented with a seizure lasting approx 10 minutes. Pt noted to be incontinent with no reports of tongue laceration or obvious injuries.  Pt was administered 2 mg Versed via IV push with no effect. Pt transferred from Nasal Cannula to NRB with no significant changes noted in SpO2. During transport, OPA was attempted with no success. 83fr NPA secured and BVM assisted ventilation initiated. Pt administered an additional 2 mg Versed via IV push and transported ot the ED.  ED Course: On arrival to the ED, he was febrile 102.1 (38.9) with blood pressure (!) 173/94 mm Hg and pulse rate (!) 145 beats/mi, RR (!) 30. Patient was unresponsive to painful stimulus.  Given patient's mental status and inability to maintain airway he was emergently intubated in the ED for airway protection. Sepsis work also initiated in the ED.  PCCM consulted for management.  Labs/Diagnostics WBC/Hgb/Hct/Plts:  17.7/13.6/42.8/279 (07/26 2048)  Na+133, Glucose 117, alk phos 240, anion gap 16 Troponin 28 EKG: Rate: 148, Rhythm: sinus tachycardia, Axis: left axis deviation,Intervals: qtc 589, QRS: RBBB, LAFB ST changes: no st elevation CXR: Some persisting cavitary lesion in the medial right lung with reticulonodular opacities in the right upper lung UA: Negative. 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, H.Pylori Negative Thrombocytopenia  Felty Syndrome RA Significant Hospital Events   7/26:Admitted to ICU with sepsis with septic shock and seizure like activity  Consults:  Neurology  Procedures:  7/26: Intubation  Significant Diagnostic Tests:  7/26: Chest Xray>Some persisting cavitary lesion in the medial right lung with reticulonodular opacities in the right upper lung 7/26: Noncontrast CT head> no acute intracranial abnormality  Micro Data:  7/26: SARS-CoV-2 PCR> negative 7/26: Influenza PCR> negative 7/26: Blood culture x2> 7/26: Urine Culture> 7/26: MRSA PCR>>  7/26: Strep pneumo urinary antigen> 7/26: Legionella urinary antigen> 7/26: Mycoplasma pneumonia>  Antimicrobials:  7/26 Vancomycin> 7/26 Cefepime> 7/26 Metronidazole>  OBJECTIVE  Blood pressure (!) 77/51, pulse 89, temperature 99.68 F (37.6 C), resp. rate (!) 24, height 5' 9.02" (1.753 m), weight 60.5 kg, SpO2 93 %.    Vent Mode: AC FiO2 (%):  [60 %] 60 % Set Rate:  [20 bmp] 20 bmp Vt Set:  [450 mL] 450 mL PEEP:  [5 cmH20] 5 cmH20   Intake/Output Summary (Last 24 hours) at 10/31/2020 0230 Last data filed at 10/31/2020 0030 Gross per 24 hour  Intake 1902.09 ml  Output 60 ml  Net 1842.09 ml   Filed Weights   10/30/20 2047  Weight: 60.5 kg    Physical Examination  GENERAL: 60 year-old critically ill patient lying in the bed intubated, mechanically ventilated and sedated EYES: Pupils equal, round, reactive to light and accommodation. No scleral icterus. Extraocular muscles intact.  HEENT: Head atraumatic, normocephalic. Oropharynx and nasopharynx clear.  NECK:   Supple, no jugular venous distention. No thyroid enlargement, no tenderness.  LUNGS: Normal breath sounds bilaterally, mild wheezing, rales ,rhonchi no crepitation. mild use of accessory muscles of respiration.  CARDIOVASCULAR: S1, S2 normal. No murmurs, rubs, or gallops.  ABDOMEN: Soft, nontender, nondistended. Bowel sounds present. No organomegaly or mass.  EXTREMITIES: No pedal edema, cyanosis, or clubbing.  NEUROLOGIC: Cranial nerves II through XII are intact.  Muscle strength 5/5 in all extremities. Sensation intact. Gait not checked.  PSYCHIATRIC: The patient is sedated SKIN: No obvious rash, lesion, or ulcer.   Labs/imaging that I havepersonally reviewed  (right click and "Reselect all SmartList Selections" daily)     Labs   CBC: Recent Labs  Lab 10/30/20 2048  WBC 17.7*  NEUTROABS 0.1*  HGB 13.6  HCT 42.8  MCV 94.3  PLT 063    Basic Metabolic Panel: Recent Labs  Lab 10/30/20 2048  NA 133*  K 3.9  CL 95*  CO2 22  GLUCOSE 117*  BUN 13  CREATININE 0.73  CALCIUM 8.5*   GFR: Estimated Creatinine Clearance: 85.1 mL/min (by C-G formula based on SCr of 0.73 mg/dL). Recent Labs  Lab 10/30/20 2048 10/30/20 2247  PROCALCITON  --  0.22  WBC 17.7*  --   LATICACIDVEN 10.0* 2.3*    Liver Function Tests: Recent Labs  Lab 10/30/20 2048  AST 39  ALT 21  ALKPHOS 240*  BILITOT 0.8  PROT 7.5  ALBUMIN 3.1*   No results for input(s): LIPASE, AMYLASE in the last 168 hours. Recent Labs  Lab 10/30/20 2135  AMMONIA 51*    ABG    Component Value Date/Time   PHART 7.276 (L) 10/01/2020 1720   PCO2ART 47.9 10/01/2020 1720   PO2ART 88 10/01/2020 1720   HCO3 31.3 (H) 10/30/2020 2214   TCO2 24 10/01/2020 1720   ACIDBASEDEF 6.6 (H) 10/30/2020 2106   O2SAT 84.2 10/30/2020 2214     Coagulation Profile: Recent Labs  Lab 10/30/20 2048  INR 1.2    Cardiac Enzymes: No results for input(s): CKTOTAL, CKMB, CKMBINDEX, TROPONINI in the last 168 hours.  HbA1C: Hgb  A1c MFr Bld  Date/Time Value Ref Range Status  07/24/2015 01:28 PM  4.0 - 6.0 % Final   UNABLE TO REPORT A1C DUE TO UNKNOWN INTERFERING FACTOR CAUSING THE ANALYTICAL RANGE TO BE OUTSIDE OF ANALYZER RANGE SAMPLE SENT TO [facility] FOR AN ALTERNATIVE METHOD    CBG: No results for input(s): GLUCAP in the last 168 hours.  Review of Systems:   Patient is intubated and sedated unable to obtain history  Past Medical History  He,  has a past medical history of COPD (chronic obstructive pulmonary disease) ([place]), Felty syndrome (HCC), Hernia, epigastric, Pancytopenia ([place]), Seropositive rheumatoid arthritis ([place]), and Tobacco use disorder.   Surgical History    Past Surgical History:  Procedure Laterality Date   ESOPHAGOGASTRODUODENOSCOPY (EGD) WITH PROPOFOL N/A 10/01/2020   Procedure: ESOPHAGOGASTRODUODENOSCOPY (EGD) WITH PROPOFOL;  Surgeon: Mauri Pole, MD;  Location: [facility];  Service: Endoscopy;  Laterality: N/A;   HEMOSTASIS CLIP PLACEMENT  10/01/2020   Procedure: HEMOSTASIS CLIP PLACEMENT;  Surgeon: Mauri Pole, MD;  Location: [facility];  Service: Endoscopy;;   INCISIONAL HERNIA REPAIR  01/17/2020   Procedure: HERNIA REPAIR INCISIONAL  AND Silvestre Gunner;  Surgeon: Kinsinger, Arta Bruce, MD;  Location: [facility];  Service: General;;   IR ANGIOGRAM SELECTIVE EACH ADDITIONAL VESSEL  10/01/2020   IR ANGIOGRAM SELECTIVE EACH ADDITIONAL VESSEL  10/01/2020   IR ANGIOGRAM VISCERAL SELECTIVE  10/01/2020   IR ANGIOGRAM VISCERAL SELECTIVE  10/01/2020   IR US GUIDE [place] RIGHT  10/01/2020   LAPAROTOMY N/A 01/17/2020   Procedure: EXPLORATORY LAPAROTOMY;  Surgeon: Mickeal Skinner, MD;  Location: [facility];  Service: General;  Laterality: N/A;    Social History   reports that he has been smoking cigarettes. He has been smoking an average of 1 pack per day. He has never used smokeless tobacco. He reports current drug use. Drug: Marijuana. He reports that he does not drink alcohol.    Family History   His family history includes CAD in his brother; COPD in his brother.   Allergies Allergies  Allergen Reactions   Levaquin [Levofloxacin In D5w] Other (See Comments)    Chest pain   Methotrexate Other (See Comments)    Chest pain     Home Medications  Prior to Admission medications   Medication Sig Start Date End Date Taking? Authorizing Provider  albuterol (PROVENTIL) (2.5 MG/3ML) 0.083% nebulizer solution Take 3 mLs (2.5 mg total) by nebulization every 6 (six) hours as needed for wheezing or shortness of breath. 08/30/20   Kathie Dike, MD  albuterol (VENTOLIN HFA) 108 (90 Base) MCG/ACT inhaler Inhale 2 puffs into the lungs every 6 (six) hours as needed for wheezing or shortness of breath. 08/30/20   Kathie Dike, MD  feeding supplement (ENSURE ENLIVE / ENSURE PLUS) LIQD Take 237 mLs by mouth 2 (two) times daily between meals. 08/04/20   Manuella Ghazi, Pratik D, DO  mometasone-formoterol (DULERA) 100-5 MCG/ACT AERO Inhale 2 puffs into the lungs 2 (two) times daily. 08/30/20   Kathie Dike, MD  mupirocin ointment (BACTROBAN) 2 % Place into the nose 2 (two) times daily. 09/28/20   Nolberto Hanlon, MD  pantoprazole (PROTONIX) 40 MG tablet Take 1 tablet (40 mg total) by mouth 2 (two) times daily. 10/07/20   Hosie Poisson, MD  predniSONE (DELTASONE) 10 MG tablet Take 1 tablet (10 mg total) by mouth 2 (two) times daily with a meal. 08/30/20   Kathie Dike, MD  Scheduled Meds:  budesonide (PULMICORT) nebulizer solution  0.25 mg Nebulization BID   chlorhexidine gluconate (MEDLINE KIT)  15 mL Mouth Rinse BID   Chlorhexidine Gluconate Cloth  6 each Topical Daily   mouth rinse  15 mL Mouth Rinse 10 times per day   Continuous Infusions:  ceFEPime (MAXIPIME) IV     metronidazole Stopped (10/31/20 0058)   midazolam 2 mg/hr (10/30/20 2322)   norepinephrine (LEVOPHED) Adult infusion 5 mcg/min (10/31/20 0112)   propofol (DIPRIVAN) infusion Stopped (10/30/20 2322)   vancomycin     PRN  Meds:.docusate sodium, fentaNYL (SUBLIMAZE) injection, ipratropium-albuterol, polyethylene glycol  Active Hospital Problem list   Acute hypoxic hypercapnic respiratory failure Sepsis with septic shock AGMA Acute metabolic encephalopathy Seizure-like activity Felty syndrome Polysubstance abuse disorder  Assessment & Plan:  Acute Hypoxic / Hypercapnic Respiratory Failure secondary in the setting of  suspected seizures, sepsis and drug intoxication (UDS +Cocaine) PMHx: COPD, tobacco abuse - PRVC  8 mL/kg, continue ventilator support & lung protective strategies - Wean PEEP & FiO2 as tolerated, maintain SpO2 > 90% - Head of bed elevated 30 degrees, VAP protocol in place - Plateau pressures less than 30 cm H20  -  Intermittent chest x-ray & ABG PRN - Daily WUA with SBT as tolerated  - Ensure adequate pulmonary hygiene  - Steroids initiated: solu-medrol 20 mg BID  - Budesonide nebs BID, bronchodilators PRN - PAD protocol in place: continue Fentanyl IVP & Versed drip  Sepsis with septic shock due to unknown source Recent Cavitary Pneumonia Recent MRSA / Prevotella in Lungs, Klebsiella PNA Bacteremia Lactic: >10, Baseline PCT: 0.22, UA: Neg, CXR: Some persisting cavitary lesion in the medial right lung with reticulonodular opacities in the right upper lung. Initial interventions/workup included: 1.5 L of LR & Cefepime/ Vancomycin/ Metronidazole - Supplemental oxygen as needed, to maintain SpO2 > 90% - f/u cultures, trend lactic/ PCT - Daily CBC - monitor WBC/ fever curve - IV antibiotics: cefepime, metronidazole & vancomycin  - IVF hydration as needed - Start Levophed to maintain MAP> 65 - Strict I/O's: Goal UOP >0.5 mL/kg/hr - Persistent hypotension consider stress dose steroids (hydrocortisone 50q6 or 100q8) - Will obtain 2D Echo given hx of drug use with no clear source of infection  Seizure Like Activity No hx of seizures or ETOH use - CT head no acute intracranial abnormality.  Consider MRI if appropriate - EEG pending - Seizure precautions - Ativan prn seizure activity - Neurology Consult   Acute Metabolic Encephalopathy due to Hypercapnia, seizures? -Provide supportive care  Polysubstance Abuse UDS +Cocaine Hx tobbacco abuse - Counseling  - Encourage smoking cessation   Best practice:  Diet:  NPO Pain/Anxiety/Delirium protocol (if indicated): Yes (RASS goal 0) VAP protocol (if indicated): Yes DVT prophylaxis: Contraindicated GI prophylaxis: PPI Glucose control:  SSI No Central venous access:  N/A Arterial line:  N/A Foley:  N/A Mobility:  bed rest  PT consulted: N/A Last date of multidisciplinary goals of care discussion [7/27] Code Status:  full code Disposition: ICU   = Goals of Care = Code Status Order: FULL  Primary Emergency Contact: Knipple,mike, Home Phone: [phone] Wishes to pursue full aggressive treatment and intervention options, including CPR and intubation.  Critical care time: 45 minutes     Rufina Falco, DNP, CCRN, FNP-C, AGACNP-BC Acute Care Nurse Practitioner  [facility] Pager: [phone] [place] [facility]  .

## 2020-10-31 NOTE — Consult Note (Signed)
Pharmacy Antibiotic Note  Marcus [name] is a 60 y.o. male with medical history including Felty's syndrome, cavitary pneumonia, chronic respiratory failure, tobacco abuse, GIB / esophageal varices, history of splenectomy, RA admitted on 10/30/2020 with respiratory failure, sepsis, drug intoxication, possible seizure activity. Pharmacy has been consulted for vancomycin dosing.   Today, 10/31/20  --Stop cefepime / metronidazole >> start ceftriaxone --Continue vancomycin given history of suspected MRSA cavitary pneumonia --Patient is leukopenic / neutropenic which is chronic issue. Hematology consulted and started Granix --Patient liberated from mechanical ventilation  Plan:  Increase vancomycin to 1 g IV q12h --Calculated AUC: 523, Cmin 13.5 --Daily Scr per protocol --Levels at steady state as clinically indicated  Height: 5' 9.02" (175.3 cm) Weight: 61 kg (134 lb 7.7 oz) IBW/kg (Calculated) : 70.74  Temp (24hrs), Avg:98.7 F (37.1 C), Min:95.36 F (35.2 C), Max:102.1 F (38.9 C)  Recent Labs  Lab 10/30/20 2048 10/30/20 2247 10/31/20 0424  WBC 17.7*  --  3.8*  CREATININE 0.73  --  0.55*  LATICACIDVEN 10.0* 2.3*  --      Estimated Creatinine Clearance: 85.8 mL/min (A) (by C-G formula based on SCr of 0.55 mg/dL (L)).    Allergies  Allergen Reactions   Levaquin [Levofloxacin In D5w] Other (See Comments)    Chest pain   Methotrexate Other (See Comments)    Chest pain    Antimicrobials this admission: Metronidazole 7/26 >> 7/27 Cefepime 7/26 >> 7/27 Vancomcyin 7/26 >> Ceftriaxone 7/27 >>  Dose adjustments this admission: N/A  Microbiology results: 7/27 Tracheal aspirate: pending 7/27 MRSA PCR: (-) 7/26 Bcx: NGTD  Thank you for allowing pharmacy to be a part of this patient's care.  Benita Gutter 10/31/2020 1:51 PM

## 2020-10-31 NOTE — Progress Notes (Signed)
Pt was suctioned prior to extubation for a small amount of thick secretions. Per Dr's order, he was extubated and placed on a 2L nasal cannula. He is voicing and is without stridor.

## 2020-10-31 NOTE — Consult Note (Signed)
[place] NOTE  Patient Care Team: [name], FNP as PCP - General (Family Medicine) Center, [facility]) Marlowe Sax, MD as Referring Physician (Internal Medicine) Cammie Sickle, MD as Consulting Physician (Hematology and Oncology)  CHIEF COMPLAINTS/PURPOSE OF CONSULTATION: Neutropenia  HISTORY OF PRESENTING ILLNESS: Patient is currently intubated/sedated. Marcus [name] 60 y.o.  male  patient with history of COPD; chronic respiratory failure on 2 L of oxygen; rheumatoid arthritis/pancytopenia severe neutropenia ?  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  UDS positive for cocaine.  Of note patient has been recently admitted to the hospital/discharge for management of lung abscess.  He had earlier presented with hemoptysis.  During hospitalizations patient was started on Granix-for his severe neutropenia which he promptly responded.  Unfortunately, this is patient's third admission to the hospital in the last 1 to 2 months.   Review of Systems  Unable to perform ROS: Intubated    MEDICAL HISTORY:  Past Medical History:  Diagnosis Date   COPD (chronic obstructive pulmonary disease) (HCC)    Felty syndrome (HCC)    Hernia, epigastric    Pancytopenia (HCC)    Seropositive rheumatoid arthritis ([place])    Tobacco use disorder     SURGICAL HISTORY: Past Surgical History:  Procedure Laterality Date   ESOPHAGOGASTRODUODENOSCOPY (EGD) WITH PROPOFOL N/A 10/01/2020   Procedure: ESOPHAGOGASTRODUODENOSCOPY (EGD) WITH PROPOFOL;  Surgeon: Mauri Pole, MD;  Location: [facility];  Service: Endoscopy;  Laterality: N/A;   HEMOSTASIS CLIP PLACEMENT  10/01/2020   Procedure: HEMOSTASIS CLIP PLACEMENT;  Surgeon: Mauri Pole, MD;  Location: [facility];  Service: Endoscopy;;   INCISIONAL HERNIA REPAIR  01/17/2020   Procedure: HERNIA REPAIR INCISIONAL AND Silvestre Gunner;  Surgeon:  Kinsinger, Arta Bruce, MD;  Location: [facility] ORS;  Service: General;;   IR ANGIOGRAM SELECTIVE EACH ADDITIONAL VESSEL  10/01/2020   IR ANGIOGRAM SELECTIVE EACH ADDITIONAL VESSEL  10/01/2020   IR ANGIOGRAM VISCERAL SELECTIVE  10/01/2020   IR ANGIOGRAM VISCERAL SELECTIVE  10/01/2020   IR US GUIDE VASC ACCESS RIGHT  10/01/2020   LAPAROTOMY N/A 01/17/2020   Procedure: EXPLORATORY LAPAROTOMY;  Surgeon: Mickeal Skinner, MD;  Location: [facility];  Service: General;  Laterality: N/A;    SOCIAL HISTORY: Social History   Socioeconomic History   Marital status: Single    Spouse name: Not on file   Number of children: Not on file   Years of education: Not on file   Highest education level: Not on file  Occupational History   Not on file  Tobacco Use   Smoking status: Every Day    Packs/day: 1.00    Types: Cigarettes   Smokeless tobacco: Never  Substance and Sexual [name]   Alcohol use: No    Alcohol/week: 0.0 standard drinks   Drug use: Yes    Types: Marijuana   Sexual [name]: Not on file  Other Topics Concern   Not on file  Social History Narrative   Lives at home with Mother. Ambulates independently.   Social Determinants of Health   Financial Resource Strain: Not on file  Food Insecurity: Not on file  Transportation Needs: Not on file  Physical [name]: Not on file  Stress: Not on file  Social Connections: Not on file  Intimate Partner Violence: Not on file    FAMILY HISTORY: Family History  Problem Relation Age of Onset   CAD Brother    COPD Brother  ALLERGIES:  is allergic to levaquin [levofloxacin in d5w] and methotrexate.  MEDICATIONS:  Current Facility-Administered Medications  Medication Dose Route Frequency Provider Last Rate Last Admin   budesonide (PULMICORT) nebulizer solution 0.25 mg  0.25 mg Nebulization BID Lang Snow, NP   0.25 mg at 10/31/20 0732   cefTRIAXone (ROCEPHIN) 2 g in sodium chloride 0.9 % 100 mL IVPB  2 g Intravenous Q24H  Tyler Pita, MD   Stopped at 10/31/20 1808   Chlorhexidine Gluconate Cloth 2 % PADS 6 each  6 each Topical Daily Lang Snow, NP       docusate sodium (COLACE) capsule 100 mg  100 mg Oral BID PRN Lang Snow, NP       heparin injection 5,000 Units  5,000 Units Subcutaneous Q8H Tyler Pita, MD   5,000 Units at 10/31/20 2109   insulin aspart (novoLOG) injection 0-9 Units  0-9 Units Subcutaneous Q4H Tyler Pita, MD   2 Units at 10/31/20 1258   ipratropium-albuterol (DUONEB) 0.5-2.5 (3) MG/3ML nebulizer solution 3 mL  3 mL Nebulization Q6H PRN Lang Snow, NP   3 mL at 10/31/20 0740   MEDLINE mouth rinse  15 mL Mouth Rinse BID Lang Snow, NP       Derrill Memo ON 11/01/2020] nicotine (NICODERM CQ - dosed in mg/24 hours) patch 21 mg  21 mg Transdermal Daily Ouma, Bing Neighbors, NP       norepinephrine (LEVOPHED) '4mg'$  in 29m premix infusion  0-10 mcg/min Intravenous Titrated OLang Snow NP   Stopped at 10/31/20 1259   pantoprazole (PROTONIX) injection 40 mg  40 mg Intravenous Daily GTyler Pita MD   40 mg at 10/31/20 1259   polyethylene glycol (MIRALAX / GLYCOLAX) packet 17 g  17 g Oral Daily PRN OLang Snow NP       Tbo-Filgrastim ([facility] injection 480 mcg  480 mcg Subcutaneous Daily BCharlaine DaltonR, MD   480 mcg at 10/31/20 1633   vancomycin (VANCOCIN) IVPB 1000 mg/200 mL premix  1,000 mg Intravenous Q12H CBenita Gutter RPH 200 mL/hr at 10/31/20 2109 1,000 mg at 10/31/20 2109      .  PHYSICAL EXAMINATION:  Vitals:   10/31/20 2000 10/31/20 2100  BP: 114/69 117/73  Pulse: 80 94  Resp: 19 (!) 27  Temp: (!) 97.16 F (36.2 C) (!) 97.52 F (36.4 C)  SpO2: 100% 99%   Filed Weights   10/30/20 2047 10/31/20 0350  Weight: 133 lb 6.1 oz (60.5 kg) 134 lb 7.7 oz (61 kg)    Physical Exam Constitutional:      Comments: Sedated/intubated.  HENT:     Head: Normocephalic and atraumatic.   Eyes:     Pupils: Pupils are equal, round, and reactive to light.  Cardiovascular:     Rate and Rhythm: Normal rate and regular rhythm.  Pulmonary:     Effort: No respiratory distress.     Breath sounds: No wheezing.     Comments: Decreased breath sounds bilaterally at bases.  No wheeze or crackles Abdominal:     General: Bowel sounds are normal. There is no distension.     Palpations: Abdomen is soft. There is no mass.     Tenderness: There is no abdominal tenderness. There is no guarding or rebound.  Musculoskeletal:        General: No tenderness.     Cervical back: Normal range of motion and neck supple.  Skin:    General:  Skin is warm.  Psychiatric:        Mood and Affect: Affect normal.     LABORATORY DATA:  I have reviewed the data as listed Lab Results  Component Value Date   WBC 3.8 (L) 10/31/2020   HGB 12.2 (L) 10/31/2020   HCT 37.5 (L) 10/31/2020   MCV 91.5 10/31/2020   PLT 270 10/31/2020   Recent Labs    11/30/19 1938 01/17/20 1454 06/10/20 2017 06/11/20 0457 10/04/20 0539 10/05/20 0056 10/06/20 0035 10/07/20 0232 10/30/20 2048 10/31/20 0424  NA 137   < > 134*   < > 140 138 134* 134* 133* 136  K 3.8   < > 3.7   < > 3.1* 2.9* 4.5 4.3 3.9 3.5  CL 101   < > 99   < > 106 96* 95* 96* 95* 99  CO2 27   < > 27   < > 26 35* 33* '29 22 28  '$ GLUCOSE 85   < > 82   < > 116* 113* 156* 134* 117* 123*  BUN 10   < > 14   < > '18 13 14 14 13 12  '$ CREATININE 0.59*   < > 0.63   < > 0.64 0.56* 0.50* 0.59* 0.73 0.55*  CALCIUM 8.6*   < > 8.3*   < > 7.6* 8.0* 7.9* 7.9* 8.5* 8.2*  GFRNONAA >60   < > >60   < > >60 >60 >60 >60 >60 >60  GFRAA >60  --   --   --   --   --   --   --   --   --   PROT 7.8   < > 8.1   < > 4.7* 5.5*  --   --  7.5  --   ALBUMIN 3.3*   < > 2.8*   < > 2.3* 2.6* 2.3* 2.1* 3.1*  --   AST 25   < > 39   < > 68* 65*  --   --  39  --   ALT 13   < > 16   < > 145* 136*  --   --  21  --   ALKPHOS 113   < > 119   < > 118 167*  --   --  240*  --   BILITOT 0.9   <  > 0.7   < > 1.7* 2.1*  --   --  0.8  --   BILIDIR  --   --  0.2  --   --   --   --   --   --   --   IBILI  --   --  0.5  --   --   --   --   --   --   --    < > = values in this interval not displayed.    RADIOGRAPHIC STUDIES: I have personally reviewed the radiological images as listed and agreed with the findings in the report. CT Head Wo Contrast  Result Date: 10/30/2020 CLINICAL DATA:  Altered mental status, seizure like [name] EXAM: CT HEAD WITHOUT CONTRAST TECHNIQUE: Contiguous axial images were obtained from the base of the skull through the vertex without intravenous contrast. COMPARISON:  MR 09/19/2020 FINDINGS: Brain: Remote appearing coded type infarct basal ganglia. Patchy areas of white matter hypoattenuation are most compatible with chronic microvascular angiopathy. No CT evident areas of new cortically based or large vascular territory infarct. Symmetric prominence of  the ventricles, cisterns and sulci compatible with parenchymal volume loss. No evidence of acute hemorrhage, hydrocephalus, extra-axial collection, visible mass lesion or mass effect. Vascular: Atherosclerotic calcification of the carotid siphons. No hyperdense vessel. Skull: Stable size of a fluid attenuation left suboccipital cystic focus in the subcutaneous tissues (3/1). No acute scalp swelling or hematoma. No calvarial fracture or worrisome osseous lesion. Sinuses/Orbits: Minimal nodular mural thickening in the right maxillary sinus. No layering air-fluid levels or pneumatized secretions. Mastoid air cells are clear. Middle ear cavities are clear. Included orbital structures are unremarkable. Other: Patient intubated at the time of exam. IMPRESSION: 1. No acute intracranial abnormality. 2. 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 3. Background of parenchymal volume loss, microvascular angiopathy and intracranial atherosclerosis. 4. Stable subcutaneous cystic nodule in the left suboccipital tissues.  Electronically Signed   By: Lovena Le M.D.   On: 10/30/2020 21:29   DG Chest Portable 1 View  Result Date: 10/30/2020 CLINICAL DATA:  Intubation EXAM: PORTABLE CHEST 1 VIEW COMPARISON:  Radiograph 10/01/2020, CT 09/30/2020 FINDINGS: Endotracheal tube tip terminates in the midtrachea, 6 cm from the carina. Transesophageal tube tip and side port terminate in the left upper quadrant, beyond the GE junction. Telemetry leads overlie the chest. Residual coarse reticulonodular opacities are seen in the right upper lung with some persistent cavitary lucency centrally corresponding to the region of a previously seen cavitary focus on comparison prior CT. Some further scarring and architectural distortion is noted in the right lung base. Lungs are otherwise clear. The aorta is calcified. The remaining cardiomediastinal contours are unremarkable. No acute osseous or soft tissue abnormality. Degenerative changes are present in the imaged spine and shoulders. IMPRESSION: Endotracheal tube tip terminates in the mid trachea. Transesophageal tube tip terminates within the gastric lumen, side port distal to the GE junction. Some persisting cavitary lesion in the medial right lung with reticulonodular opacities in the right upper lung, similar to diminished from comparison radiography. Electronically Signed   By: Lovena Le M.D.   On: 10/30/2020 21:25   EEG adult  Result Date: 10/31/2020 Lora Havens, MD     10/31/2020  6:15 PM Patient Name: ELIAHS SPRUIELL MRN: [id number] Epilepsy Attending: Lora Havens Referring Physician/Provider: Lang Snow NP Date: 10/31/2020 Duration: 20.30 mins Patient history: 60yo M with seizure like [name]. EEG to evaluate for seizure Level of alertness: Awake, asleep AEDs during EEG study: Versed Technical aspects: This EEG study was done with scalp electrodes positioned according to the 10-20 International system of electrode placement. Electrical [name] was acquired at a  sampling rate of '500Hz'$  and reviewed with a high frequency filter of '70Hz'$  and a low frequency filter of '1Hz'$ . EEG data were recorded continuously and digitally stored. Description: No clear posterior dominant rhythm was seen. Sleep was characterized by vertex waves, sleep spindles (12 to 14 Hz), maximal frontocentral region.  EEG showed intermittent 3-'6hz'$  theta-delta slowing.There is an excessive amount of 15 to 18 Hz beta [name] distributed symmetrically and diffusely. Hyperventilation and photic stimulation were not performed.   ABNORMALITY - Intermittent slow, generalized - Excessive beta, generalized IMPRESSION: This study is suggestive of mild diffuse encephalopathy. No seizures or epileptiform discharges were seen throughout the recording. The excessive beta [name] seen in the background is most likely due to the effect of benzodiazepine and is a benign EEG pattern. Lora Havens   ECHOCARDIOGRAM COMPLETE  Result Date: 10/31/2020    ECHOCARDIOGRAM REPORT   Patient Name:  PASCHAL GIOVANNONI [facility] Date of Exam: 10/31/2020 Medical Rec #:  [id number]    Height:       69.0 in Accession #:    [id number]   Weight:       134.5 lb Date of Birth:  [birth year]     BSA:          1.745 m Patient Age:    33 years     BP:           125/91 mmHg Patient Gender: M            HR:           70 bpm. 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 COPD; Risk Factors:Current Smoker.  Sonographer:     Charmayne Sheer RDCS (AE) Referring Phys:  [id number] Lang Snow Diagnosing Phys: Nelva Bush MD  Sonographer Comments: Suboptimal apical window. Image acquisition challenging due to COPD. IMPRESSIONS  1. Left ventricular ejection fraction, by estimation, is 55 to 60%. The left ventricle has normal function. Left ventricular endocardial border not optimally  defined to evaluate regional wall motion. Left ventricular diastolic parameters are consistent with Grade I diastolic dysfunction (impaired relaxation).  2. Right ventricular systolic function is low normal. The right ventricular size is mildly enlarged.  3. The mitral valve is grossly normal. Trivial mitral valve regurgitation. No evidence of mitral stenosis.  4. The aortic valve is tricuspid. There is mild thickening of the aortic valve. Aortic valve regurgitation is not visualized. Mild aortic valve sclerosis is present, with no evidence of aortic valve stenosis. Conclusion(s)/Recommendation(s): There are no definite vegetations, though valves are suboptimally visualized. Consider TEE if clinical concern for endocarditis persists. FINDINGS  Left Ventricle: Left ventricular ejection fraction, by estimation, is 55 to 60%. The left ventricle has normal function. Left ventricular endocardial border not optimally defined to evaluate regional wall motion. The left ventricular internal cavity size was normal in size. There is no left ventricular hypertrophy. Left ventricular diastolic parameters are consistent with Grade I diastolic dysfunction (impaired relaxation). Right Ventricle: The right ventricular size is mildly enlarged. No increase in right ventricular wall thickness. Right ventricular systolic function is low normal. Left Atrium: Left atrial size was normal in size. Right Atrium: Right atrial size was normal in size. Pericardium: There is no evidence of pericardial effusion. Mitral Valve: The mitral valve is grossly normal. Trivial mitral valve regurgitation. No evidence of mitral valve stenosis. MV peak gradient, 2.3 mmHg. The mean mitral valve gradient is 1.0 mmHg. Tricuspid Valve: The tricuspid valve is normal in structure. Tricuspid valve regurgitation is trivial. Aortic Valve: The aortic valve is tricuspid. There is mild thickening of the aortic valve. Aortic valve regurgitation is not visualized. Mild  aortic valve sclerosis is present, with no evidence of aortic valve stenosis. Aortic valve mean gradient measures 2.0 mmHg. Aortic valve peak gradient measures 4.3 mmHg. Aortic valve area, by VTI measures 2.96 cm. Pulmonic Valve: The pulmonic valve was grossly normal. Pulmonic valve regurgitation is not visualized. No evidence of pulmonic stenosis. Aorta: The aortic root is normal in size and structure. Pulmonary Artery: The pulmonary artery is not well seen. Venous: IVC assessment for right atrial pressure unable to be performed due  to mechanical ventilation. IAS/Shunts: No atrial level shunt detected by color flow Doppler.  LEFT VENTRICLE PLAX 2D LVIDd:         4.41 cm  Diastology LVIDs:         3.44 cm  LV e' medial:    6.53 cm/s LV PW:         0.97 cm  LV E/e' medial:  8.6 LV IVS:        0.88 cm  LV e' lateral:   8.70 cm/s LVOT diam:     2.20 cm  LV E/e' lateral: 6.4 LV SV:         55 LV SV Index:   32 LVOT Area:     3.80 cm  RIGHT VENTRICLE TAPSE (M-mode): 1.9 cm LEFT ATRIUM           Index LA diam:      3.20 cm 1.83 cm/m LA Vol (A4C): 9.2 ml  5.25 ml/m  AORTIC VALVE                   PULMONIC VALVE AV Area (Vmax):    2.84 cm    PV Vmax:       0.79 m/s AV Area (Vmean):   2.72 cm    PV Vmean:      57.800 cm/s AV Area (VTI):     2.96 cm    PV VTI:        0.150 m AV Vmax:           104.00 cm/s PV Peak grad:  2.5 mmHg AV Vmean:          70.000 cm/s PV Mean grad:  1.0 mmHg AV VTI:            0.186 m AV Peak Grad:      4.3 mmHg AV Mean Grad:      2.0 mmHg LVOT Vmax:         77.70 cm/s LVOT Vmean:        50.000 cm/s LVOT VTI:          0.145 m LVOT/AV VTI ratio: 0.78  AORTA Ao Root diam: 3.40 cm MITRAL VALVE MV Area (PHT): 6.77 cm    SHUNTS MV Area VTI:   2.87 cm    Systemic VTI:  0.14 m MV Peak grad:  2.3 mmHg    Systemic Diam: 2.20 cm MV Mean grad:  1.0 mmHg MV Vmax:       0.76 m/s MV Vmean:      50.6 cm/s MV Decel Time: 112 msec MV E velocity: 56.10 cm/s MV A velocity: 58.70 cm/s MV E/A ratio:  0.96  Harrell Gave End MD Electronically signed by Nelva Bush MD Signature Date/Time: 10/31/2020/4:28:52 PM    Final    US LIVER DOPPLER  Result Date: 10/02/2020 CLINICAL DATA:  60 year old male with upper gastrointestinal hemorrhage. Evaluate for presence cirrhosis and signs of portal hypertension. EXAM: DUPLEX ULTRASOUND OF LIVER TECHNIQUE: Color and duplex Doppler ultrasound was performed to evaluate the hepatic in-flow and out-flow vessels. COMPARISON:  09/30/2020 FINDINGS: Liver: Normal parenchymal echogenicity. Micro nodularity about the surface contour of the liver. No focal lesion, mass or intrahepatic biliary ductal dilatation. Main Portal Vein size: 1.0 cm Portal Vein Velocities Main Prox:  23 cm/sec, antegrade Main Mid: 21 cm/sec, antegrade Main Dist:  24 cm/sec, antegrade Right: 20 cm/sec, antegrade Left: 12 cm/sec, antegrade Hepatic Vein Velocities Right:  23 cm/sec Middle:  31 cm/sec Left:  24 cm/sec IVC: Present  and patent with normal respiratory phasicity. Hepatic Artery Velocity:  44 cm/sec Splenic Vein Velocity:  20 cm/sec Spleen: Surgically absent. Portal Vein Occlusion/Thrombus: No Splenic Vein Occlusion/Thrombus: No Ascites: Trace right perinephric fluid. Varices: None IMPRESSION: 1. Mild nodular hepatic contour about in otherwise normal appearing hepatic parenchyma as could be seen with early hepatic cirrhosis. 2. Patent visualized portal system with antegrade flow. Ruthann Cancer, MD [facility] Electronically Signed   By: Ruthann Cancer MD   On: 10/02/2020 11:35   [place] EKG SITE RITE  Result Date: 10/31/2020 If Site Rite image not attached, placement could not be confirmed due to current cardiac rhythm.  CT Angio Abd/Pel w/ and/or w/o  Result Date: 10/03/2020 CLINICAL DATA:  60 year old male with GI bleeding, previous negative mesenteric angiogram with continued melena EXAM: CTA ABDOMEN AND PELVIS WITHOUT AND WITH CONTRAST  TECHNIQUE: Multidetector CT imaging of the abdomen and pelvis was performed using the standard protocol during bolus administration of intravenous contrast. Multiplanar reconstructed images and MIPs were obtained and reviewed to evaluate the vascular anatomy. CONTRAST:  156m OMNIPAQUE IOHEXOL 350 MG/ML SOLN COMPARISON:  09/30/2020 FINDINGS: VASCULAR Aorta: Diameter of the distal thoracic aorta at the hiatus measures 2.1 cm. Atherosclerotic changes of the abdominal aorta without pedunculated plaque or ulcerated plaque. No dissection. Greatest diameter of the infrarenal abdominal aorta 3.1 cm, unchanged. Celiac: Celiac artery patent. Minimal atherosclerosis. Surgical ligation of the splenic artery. Left gastric artery patent. Hepatic artery patent with branches patent. SMA: Atherosclerotic changes at the SMA origin with mixed calcified and soft plaque. Branches are patent. Renals: - Right: Single right renal artery with minimal atherosclerosis at the origin. Normal course caliber and contour. - Left: Single left renal artery with minimal atherosclerosis. No high-grade stenosis. Normal course caliber and contour. IMA: IMA is patent. Right lower extremity: Unremarkable course, caliber, and contour of the right iliac system. No aneurysm, dissection, or occlusion. Mild atherosclerosis without high-grade stenosis or occlusion. Hypogastric artery is patent. Common femoral artery patent. Proximal SFA and profunda femoris patent. Left lower extremity: Unremarkable course, caliber, and contour of the left iliac system. No aneurysm, dissection, or occlusion. Mild atherosclerosis with no high-grade stenosis or occlusion. Hypogastric artery is patent. Common femoral artery patent. Proximal SFA and profunda femoris patent. Veins: Portal: Portal vein is patent. Left and right portal veins patent. Superior mesenteric vein and inferior mesenteric vein are patent. No calcifications, filling defects, or stenosis. There are no enlarged  gastric varices or esophageal varices identified. Systemic: Unremarkable IVC, iliac veins, proximal femoral veins. Bilateral renal veins patent, unremarkable with no accessory renal veins identified. No portosystemic shunt identified. Review of the MIP images confirms the above findings. NON-VASCULAR Lower chest: Small bilateral pleural effusions. Emphysematous changes at the lung bases. Motion artifact somewhat limits evaluation. Scarring/atelectasis present, as was previous. Hepatobiliary: Relatively unremarkable appearance of liver. Slight heterogeneous enhancement profile of the posterior right liver, which was present on the comparison, likely perfusion phenomenon. No focal lesion identified. Partially calcified material within the gallbladder lumen compatible with cholelithiasis. No inflammatory changes. Pancreas: Unremarkable. Spleen: Splenectomy Adrenals/Urinary Tract: - Right adrenal gland: Unremarkable - Left adrenal gland: Unremarkable. - Right kidney: No hydronephrosis, nephrolithiasis, inflammation, or ureteral dilation. No focal lesion. - Left Kidney: No hydronephrosis, nephrolithiasis, inflammation, or ureteral dilation. No focal lesion. - Urinary Bladder: Unremarkable. Stomach/Bowel: - Stomach: Surgical clips within the gastric cardia. No extravasation of contrast or pooling of contrast. Otherwise unremarkable stomach. - Small bowel: Small bowel unremarkable. - Appendix:  Normal. - Colon: No inflammatory changes of the colon. No significant stool burden. No extravasation of contrast/pooling of contrast. Diverticular disease within the left colon with no acute inflammatory changes. Fluid within the distal sigmoid colon and within the rectum, nonspecific. Lymphatic: No adenopathy. Mesenteric: No free fluid or air. No mesenteric adenopathy. Reproductive: Unremarkable prostate Other: Fat containing left inguinal hernia. Right inguinal hernia containing mostly fat. There is a wall of short segment small  bowel which enters the base of the hernia sac of the right inguinal hernia. Surgical changes along the midline abdomen with Richter type hernia involving transverse colon in the subxiphoid region as well as transverse colon and small bowel near the umbilicus. No evidence of entrapment of small bowel or colon. Edema within the body wall and mesentery compatible with anasarca. Musculoskeletal: No acute displaced fracture. Degenerative changes of the spine. No bony canal narrowing. Degenerative changes of the hips. IMPRESSION: CT angiogram is negative for gastrointestinal hemorrhage. Negative for overt signs of portal hypertension, without esophageal varices, gastric varices, or overt portosystemic shunt. Evidence of fluid positive state with body wall/mesenteric edema/anasarca and small bilateral pleural effusions. Aortic atherosclerosis and redemonstration infrarenal abdominal aortic aneurysm measuring 3.1 cm. Aortic Atherosclerosis (ICD10-I70.0). Aortic aneurysm NOS (ICD10-I71.9). Recommend follow-up every 3 years. This recommendation follows ACR consensus guidelines: White Paper of the ACR Incidental Findings Committee II on Vascular Findings. J Am Coll Radiol 2013; 10:789-794. Additional ancillary findings as above. Signed, Dulcy Fanny. Dellia Nims, RPVI [facility] Radiology Electronically Signed   By: Corrie Mckusick D.O.   On: 10/03/2020 11:06    Acquired neutropenia [facility] #60 year old male patient with history of rheumatoid arthritis/Felty syndrome/LGL leukemia; currently admitted hospital for seizure episode/altered mental status needing intubation.  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  Today ANC 100 -proceed with Granix daily x3-; to keep Allison greater than 1.5.  Patient's neutrophil count in the last 2 hospitalization-has  improved significantly on Granix.   #MRSA-lung abscess s/p IV vancomycin-worsening noted question underlying fungal infection-antibiotics as per pulmonary.  #Seizure [name]/altered mental status needing intubation-sedation.  UDS positive for cocaine.  The above plan of care was discussed with nurse taking care of the patient; also with Dr. Patsey Berthold.  Thank you Dr. Patsey Berthold for allowing me to participate in the care of your pleasant patient. Please do not hesitate to contact me with questions or concerns in the interim.  All questions were answered. The patient knows to call the clinic with any problems, questions or concerns.    Cammie Sickle, MD 10/31/2020 10:52 PM

## 2020-10-31 NOTE — Progress Notes (Signed)
Initial Nutrition Assessment  DOCUMENTATION CODES:   Severe malnutrition in context of chronic illness  INTERVENTION:   If tube feeds initiated, recommend:  Vital 1.5 '@50ml'$ /hr- Initiate at 12m/hr and increase by 116mhr q 8 hours until goal rate is reached  Pro-Source 4516mID via tube, provides 40kcal and 11g of protein per serving   Free water flushes 64m16m hours to maintain tube patency   Regimen provides 1880kcal/day, 103g/day protein and 1097ml6m free water  Pt at high refeed risk; recommend monitor potassium, magnesium and phosphorus labs daily until stable  NUTRITION DIAGNOSIS:   Severe Malnutrition related to chronic illness (COPD, cirrhosis, substance abuse) as evidenced by mild fat depletion, severe fat depletion, moderate muscle depletion, severe muscle depletion.  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  Pt sedated and ventilated. OGT in place. Pt currently undergoing SBTs. Will plan to initiate tube feeds if pt does not extubate. Suspect pt with poor oral intake at baseline as pt is malnourished. Pt is likely at refeed risk. Of note, pt is edentulous. Per chart, pt's UBW appears to be ~170-175lbs; it appears that pt lost down from 172lbs to 140lbs from March to April. Pt is noted to have had multiple admissions during that time for PNA and COPD exacerbation. Pt does appear to be weight stable since April.   Medications reviewed and include: heparin, insulin, protonix, cefepime, metronidazole, levophed, vancomycin   Labs reviewed: K 3.5 wnl, creat 0.55(L), P 4.5 wnl, Mg 2.0 wnl Ammonia- 51(H)- 7/26 Wbc-  3.8(L) Cbgs- 117, 170, 197 x 24 hrs  Patient is currently intubated on ventilator support MV: 9.0 L/min Temp (24hrs), Avg:98.9 F (37.2 C), Min:95.36 F (35.2 C), Max:102.1 F (38.9 C)  Propofol: none   MAP- >65mmH8mOP- 700ml  46mRITION - FOCUSED PHYSICAL EXAM:  Flowsheet Row Most Recent Value  Orbital Region Mild depletion  Upper Arm Region Severe depletion  Thoracic and Lumbar Region Severe depletion  Buccal Region Mild depletion  Temple Region Mild depletion  Clavicle Bone Region Severe depletion  Clavicle and Acromion Bone Region Severe depletion  Scapular Bone Region Severe depletion  Dorsal Hand Severe depletion  Patellar Region Severe depletion  Anterior Thigh Region Severe depletion  Posterior Calf Region Severe depletion  Edema (RD Assessment) None  Hair Reviewed  Eyes Reviewed  Mouth Reviewed  Skin Reviewed  Nails Reviewed   Diet Order:   Diet Order             Diet NPO time specified  Diet effective now                  EDUCATION NEEDS:   No education needs have been identified at this time  Skin:  Skin Assessment: Reviewed RN Assessment (ecchymosis)  Last BM:  7/27- TYPE 1  Height:   Ht Readings from Last 1 Encounters:  10/30/20 5' 9.02" (1.753 m)    Weight:   Wt Readings from Last 1 Encounters:  10/31/20 61 kg    Ideal Body Weight:  72.7 kg  BMI:  Body mass index is 19.85 kg/m.  Estimated Nutritional Needs:  Kcal:  1926kcal/day  Protein:  95-105g/day  Fluid:  1.8-2.1L/day  Koleen Distance MS, RD, LDN Please refer to [facility] for RD and/or RD on-call/weekend/after hours pager

## 2020-10-31 NOTE — Procedures (Signed)
Patient Name: Marcus [name]  MRN: [id number]  Epilepsy Attending: Lora Havens  Referring Physician/Provider: Lang Snow NP Date: 10/31/2020 Duration: 20.30 mins  Patient history: 60yo M with seizure like activity. EEG to evaluate for seizure  Level of alertness: Awake, asleep  AEDs during EEG study: Versed  Technical aspects: This EEG study was done with scalp electrodes positioned according to the 10-20 International system of electrode placement. Electrical activity was acquired at a sampling rate of '500Hz'$  and reviewed with a high frequency filter of '70Hz'$  and a low frequency filter of '1Hz'$ . EEG data were recorded continuously and digitally stored.   Description: No clear posterior dominant rhythm was seen. Sleep was characterized by vertex waves, sleep spindles (12 to 14 Hz), maximal frontocentral region.  EEG showed intermittent 3-'6hz'$  theta-delta slowing.There is an excessive amount of 15 to 18 Hz beta activity distributed symmetrically and diffusely. Hyperventilation and photic stimulation were not performed.     ABNORMALITY - Intermittent slow, generalized - Excessive beta, generalized  IMPRESSION: This study is suggestive of mild diffuse encephalopathy. No seizures or epileptiform discharges were seen throughout the recording. The excessive beta activity seen in the background is most likely due to the effect of benzodiazepine and is a benign EEG pattern.   Kelicia Youtz Barbra Sarks

## 2020-11-01 ENCOUNTER — Inpatient Hospital Stay: Payer: Medicaid Other

## 2020-11-01 DIAGNOSIS — J851 Abscess of lung with pneumonia: Secondary | ICD-10-CM | POA: Diagnosis not present

## 2020-11-01 DIAGNOSIS — R569 Unspecified convulsions: Secondary | ICD-10-CM | POA: Diagnosis not present

## 2020-11-01 DIAGNOSIS — J9601 Acute respiratory failure with hypoxia: Secondary | ICD-10-CM | POA: Diagnosis not present

## 2020-11-01 LAB — GLUCOSE, CAPILLARY
Glucose-Capillary: 43 mg/dL — CL (ref 70–99)
Glucose-Capillary: 101 mg/dL — ABNORMAL HIGH (ref 70–99)
Glucose-Capillary: 93 mg/dL (ref 70–99)
Glucose-Capillary: 317 mg/dL — ABNORMAL HIGH (ref 70–99)
Glucose-Capillary: 140 mg/dL — ABNORMAL HIGH (ref 70–99)
Glucose-Capillary: 137 mg/dL — ABNORMAL HIGH (ref 70–99)
Glucose-Capillary: 178 mg/dL — ABNORMAL HIGH (ref 70–99)

## 2020-11-01 LAB — BASIC METABOLIC PANEL
Anion gap: 8 (ref 5–15)
BUN: 12 mg/dL (ref 6–20)
CO2: 29 mmol/L (ref 22–32)
Calcium: 8.6 mg/dL — ABNORMAL LOW (ref 8.9–10.3)
Chloride: 103 mmol/L (ref 98–111)
Creatinine, Ser: 0.57 mg/dL — ABNORMAL LOW (ref 0.61–1.24)
GFR, Estimated: >60 mL/min (ref >60)
Glucose, Bld: 80 mg/dL (ref 70–99)
Potassium: 3.7 mmol/L (ref 3.5–5.1)
Sodium: 140 mmol/L (ref 135–145)

## 2020-11-01 LAB — HEMOGLOBIN A1C
Hgb A1c MFr Bld: 5.7 % — ABNORMAL HIGH (ref 4.8–5.6)
Mean Plasma Glucose: 117 mg/dL

## 2020-11-01 LAB — CBC WITH DIFFERENTIAL/PLATELET
Abs Immature Granulocytes: 0.04 10*3/uL (ref 0.00–0.07)
Basophils Absolute: 0 10*3/uL (ref 0.0–0.1)
Basophils Relative: 1 %
Eosinophils Absolute: 0 10*3/uL (ref 0.0–0.5)
Eosinophils Relative: 1 %
HCT: 36.9 % — ABNORMAL LOW (ref 39.0–52.0)
Hemoglobin: 11.8 g/dL — ABNORMAL LOW (ref 13.0–17.0)
Immature Granulocytes: 2 %
Lymphocytes Relative: 16 %
Lymphs Abs: 0.4 10*3/uL — ABNORMAL LOW (ref 0.7–4.0)
MCH: 29.6 pg (ref 26.0–34.0)
MCHC: 32 g/dL (ref 30.0–36.0)
MCV: 92.7 fL (ref 80.0–100.0)
Monocytes Absolute: 0.5 10*3/uL (ref 0.1–1.0)
Monocytes Relative: 20 %
Neutro Abs: 1.6 10*3/uL — ABNORMAL LOW (ref 1.7–7.7)
Neutrophils Relative %: 60 %
Platelets: 216 10*3/uL (ref 150–400)
RBC: 3.98 MIL/uL — ABNORMAL LOW (ref 4.22–5.81)
RDW: 18.6 % — ABNORMAL HIGH (ref 11.5–15.5)
Smear Review: NORMAL
WBC: 2.6 10*3/uL — ABNORMAL LOW (ref 4.0–10.5)
nRBC: 0 % (ref 0.0–0.2)

## 2020-11-01 LAB — LEGIONELLA PNEUMOPHILA SEROGP 1 UR AG: L. pneumophila Serogp 1 Ur Ag: NEGATIVE

## 2020-11-01 LAB — AMMONIA: Ammonia: 36 umol/L — ABNORMAL HIGH (ref 9–35)

## 2020-11-01 LAB — TROPONIN I (HIGH SENSITIVITY)
Troponin I (High Sensitivity): 32 ng/L — ABNORMAL HIGH (ref <18)
Troponin I (High Sensitivity): 25 ng/L — ABNORMAL HIGH (ref <18)

## 2020-11-01 LAB — BLOOD GAS, VENOUS
Acid-Base Excess: 5.1 mmol/L — ABNORMAL HIGH (ref 0.0–2.0)
Bicarbonate: 31 mmol/L — ABNORMAL HIGH (ref 20.0–28.0)
O2 Saturation: 93 %
Patient temperature: 37
pCO2, Ven: 50 mmHg (ref 44.0–60.0)
pH, Ven: 7.4 (ref 7.250–7.430)
pO2, Ven: 67 mmHg — ABNORMAL HIGH (ref 32.0–45.0)

## 2020-11-01 LAB — PROCALCITONIN: Procalcitonin: 0.33 ng/mL

## 2020-11-01 LAB — MAGNESIUM: Magnesium: 1.9 mg/dL (ref 1.7–2.4)

## 2020-11-01 LAB — PHOSPHORUS: Phosphorus: 2.9 mg/dL (ref 2.5–4.6)

## 2020-11-01 IMAGING — CT CT CHEST W/O CM
2 of 3 series · 15 of 36 positions shown, 18 images · non-contrast
Comparison: [DATE]

CLINICAL DATA: Follow-up cavitary pneumonia.

EXAM:
CT CHEST WITHOUT CONTRAST
TECHNIQUE: Multidetector CT imaging of the chest was performed following the
standard protocol without IV contrast.

[Series 2: thorax · axial · 0.72mm/px · z∈[-1019,-719]mm · 12 of 176 slices shown, 15 images]
[im 13/176  mediastinal]
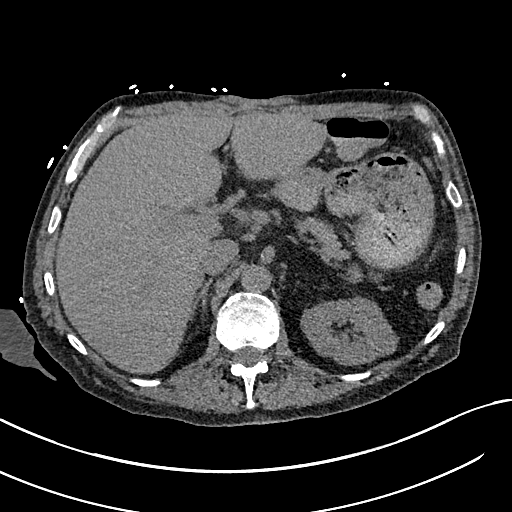
[im 13/176  lung]
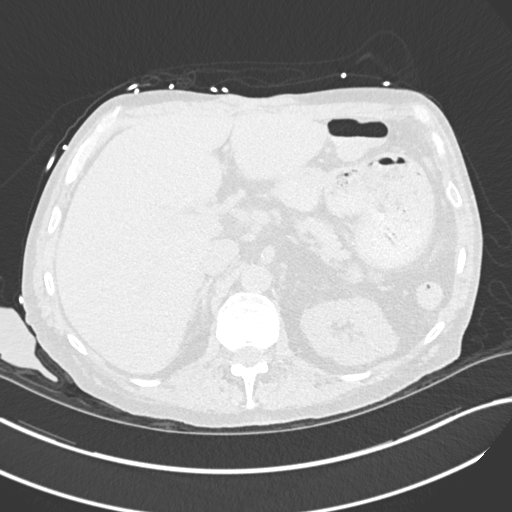
[im 26/176  lung]
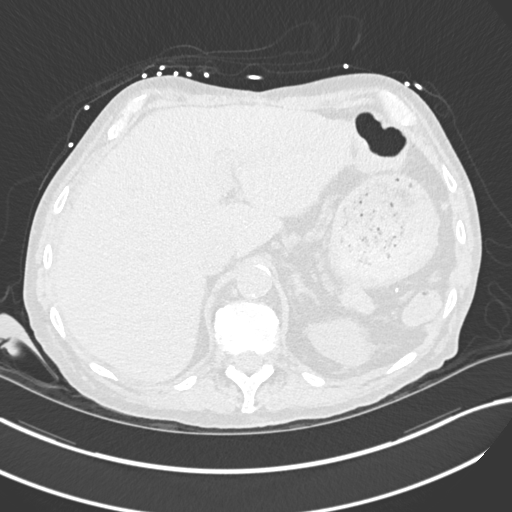
[im 39/176  lung]
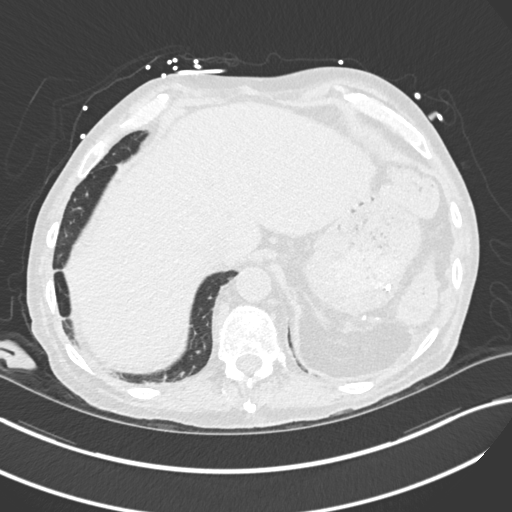
[im 52/176  lung]
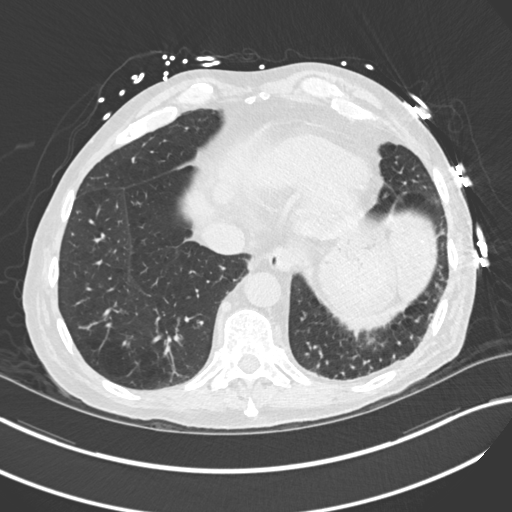
[im 65/176  mediastinal]
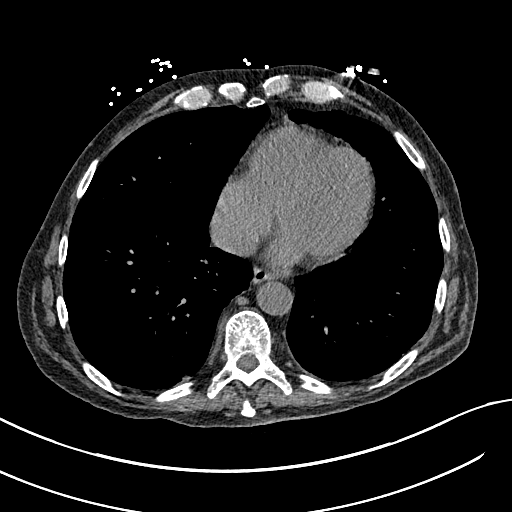
[im 65/176  lung]
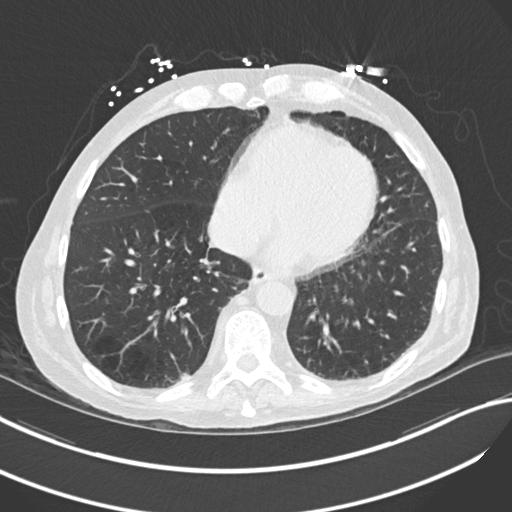
[im 78/176  lung]
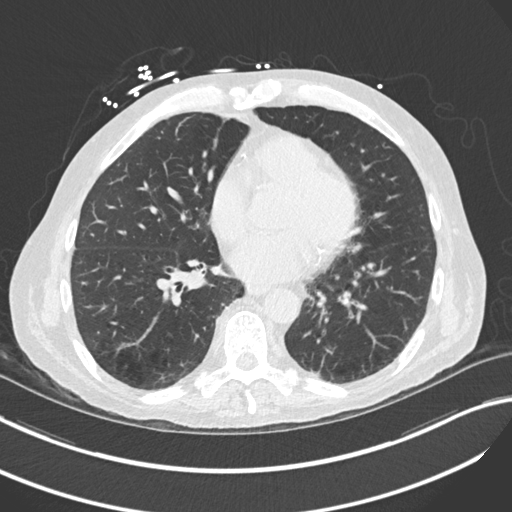
[im 98/176  lung]
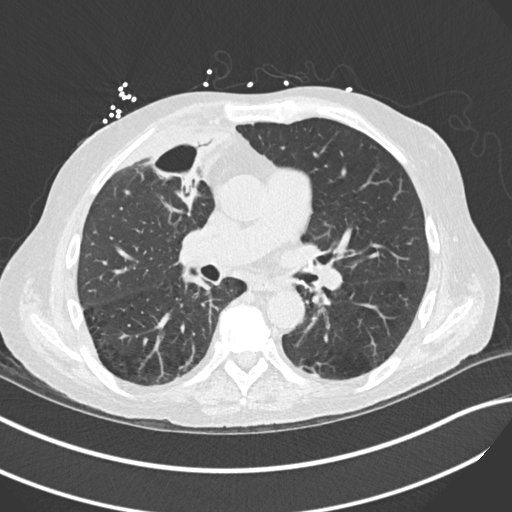
[im 111/176  lung]
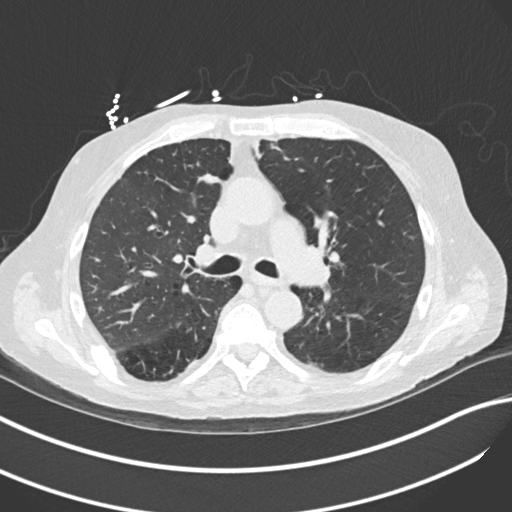
[im 124/176  mediastinal]
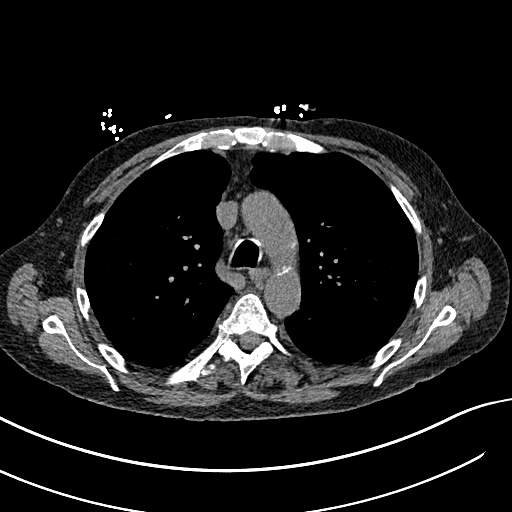
[im 124/176  lung]
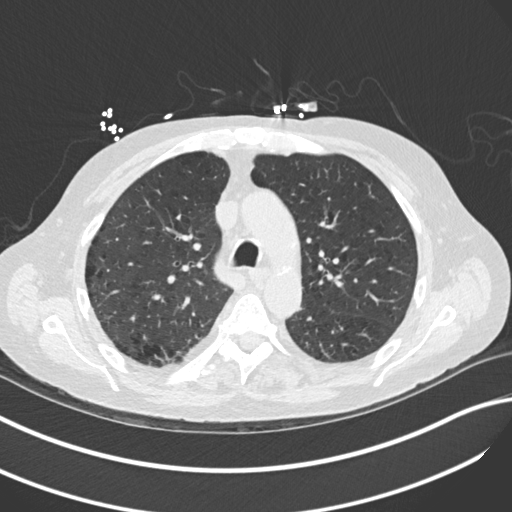
[im 137/176  lung]
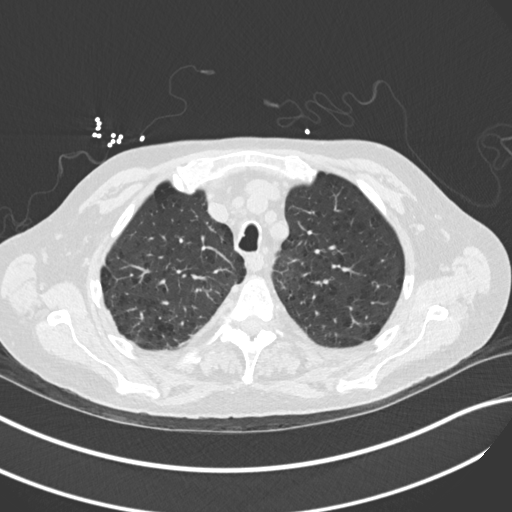
[im 150/176  lung]
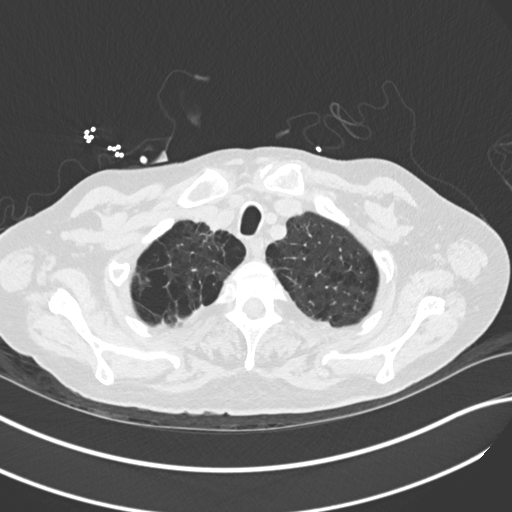
[im 163/176  lung]
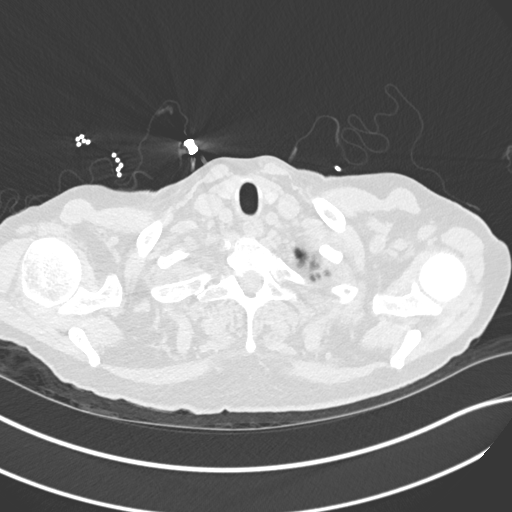

[Series 5: coronal · coronal · 0.71mm/px · 3 of 136 slices shown]
[im 28/136  lung]
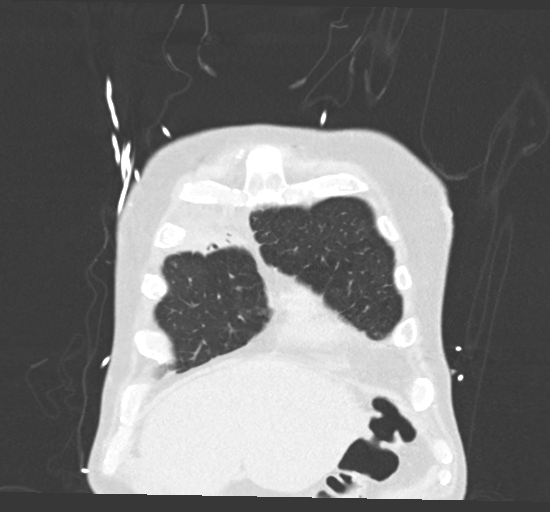
[im 55/136  lung]
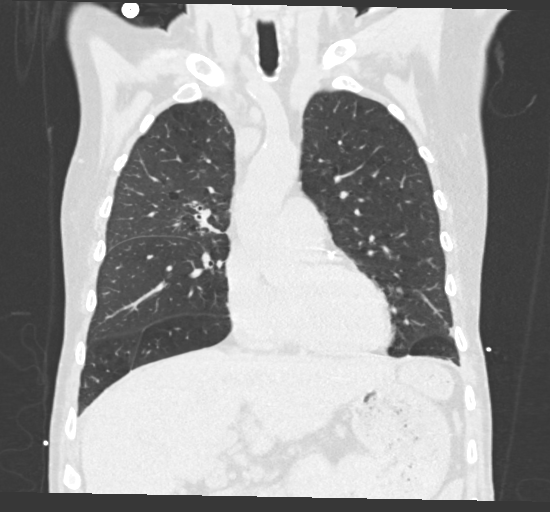
[im 82/136  lung]
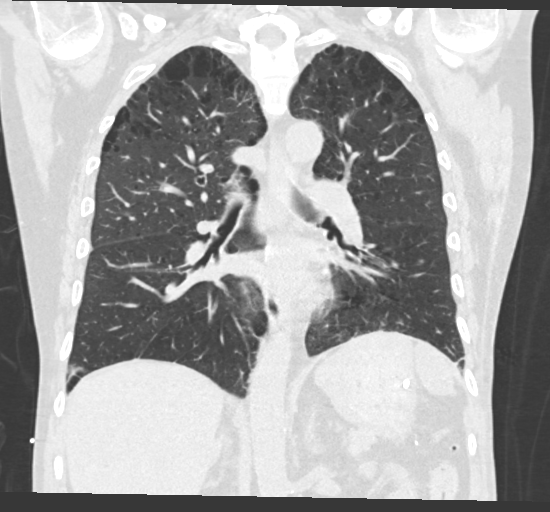

[15 of 36 positions shown; findings below may reference images not displayed]

FINDINGS: Cardiovascular: The heart is normal in size. No pericardial
effusion. The aorta is normal in caliber. Stable scattered
atherosclerotic calcifications. Stable age advanced three-vessel
coronary artery calcifications.

Mediastinum/Nodes: No mediastinal or hilar mass or adenopathy. Small
scattered lymph nodes are stable. The esophagus is grossly normal.

Lungs/Pleura: Significant interval improved appearance of the
anterior right upper lobe lung abscess. This measured a maximum of
8.4 x 5.7 cm on the prior study and now measures 4.2 x 2.4 cm. There
is no residual fluid in the cavity. Moderate persistent air. Some
surrounding inflammatory changes persist.

Stable underlying emphysematous changes and pulmonary scarring. No
new pulmonary lesions or new pulmonary infiltrates. No pleural
effusions or pleural lesions.

Upper Abdomen: No significant upper abdominal findings.

Musculoskeletal: No chest wall mass, supraclavicular or axillary
adenopathy. The bony thorax is intact.
IMPRESSION: 1. Significant interval improved appearance of the anterior right
upper lobe lung abscess. All fluid in the cavity has resolved.
2. Stable emphysematous changes and pulmonary scarring.
3. No new pulmonary lesions or new pulmonary infiltrates.
4. Stable age advanced three-vessel coronary artery calcifications.
5. Emphysema and aortic atherosclerosis.

Aortic Atherosclerosis ([KM]-[KM]) and Emphysema ([KM]-[KM]).

## 2020-11-01 IMAGING — DX DG CHEST 1V
1 series · 2 of 2 positions shown · non-contrast
Comparison: [DATE] and earlier.

CLINICAL DATA: 60-year-old male with shortness of breath.

EXAM:
CHEST  1 VIEW

[Series 1: chest ap · 0.14mm/px · 2 of 2 slices shown]
[im 1/2]
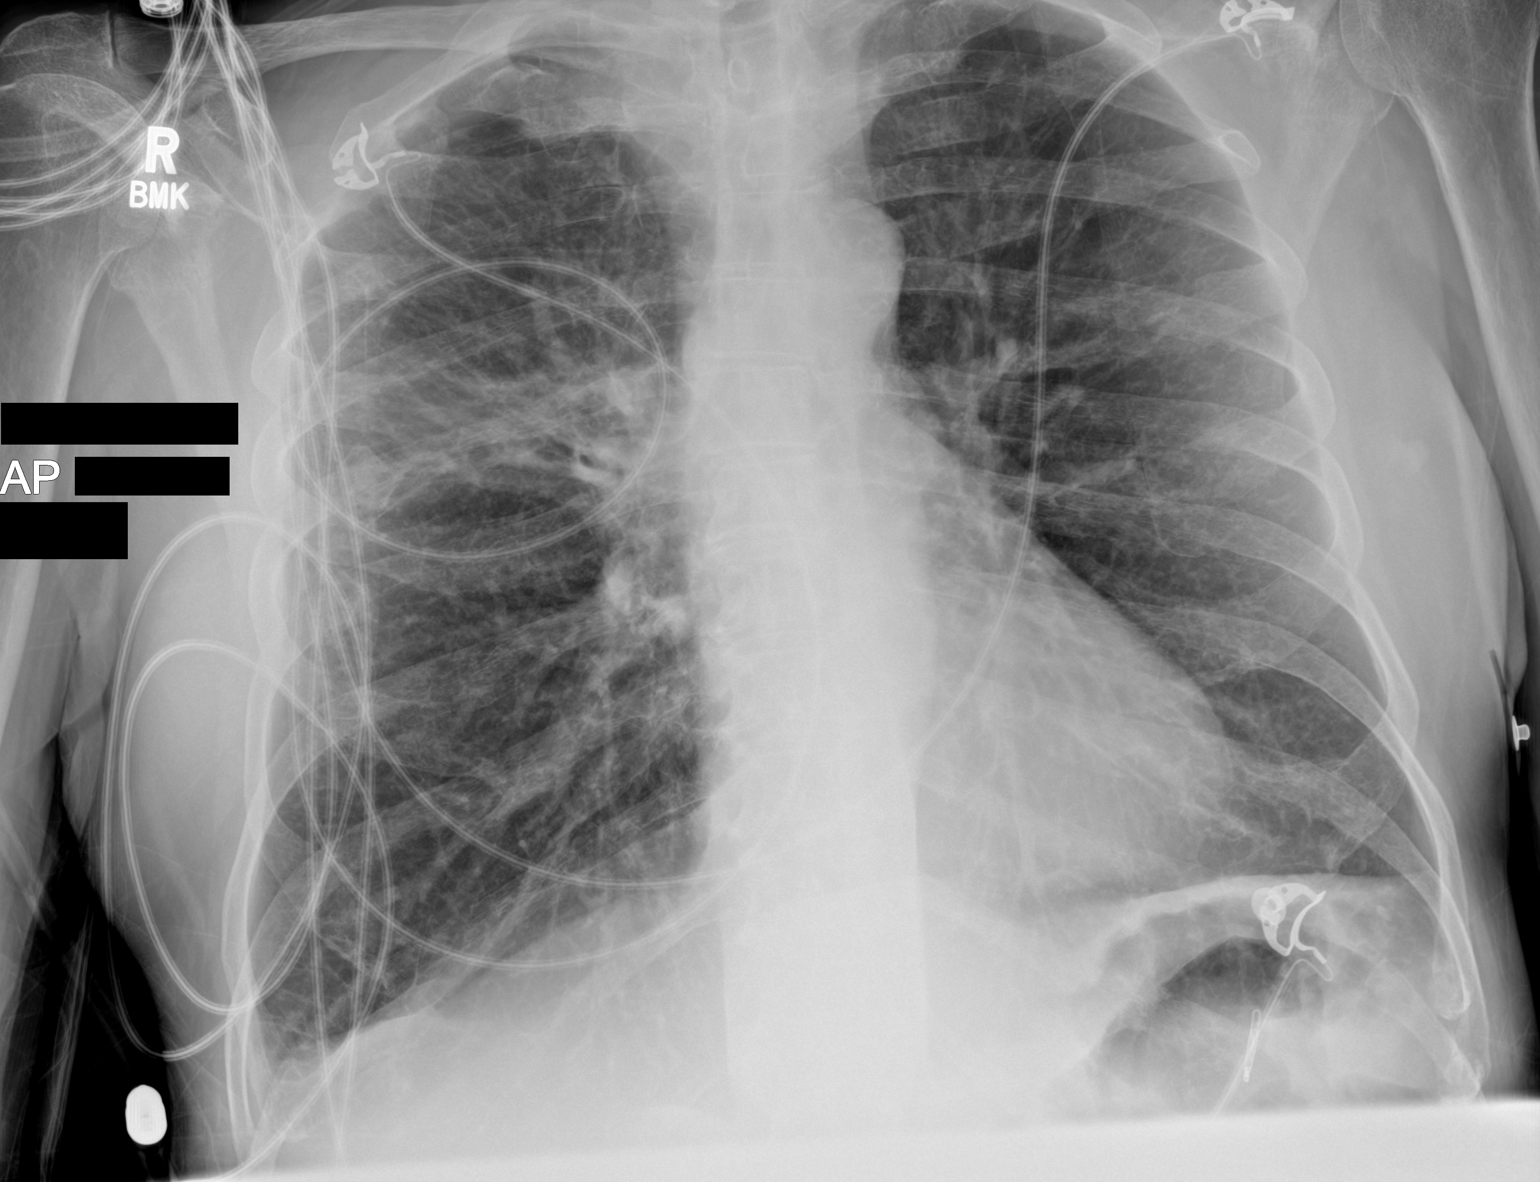
[im 2/2]
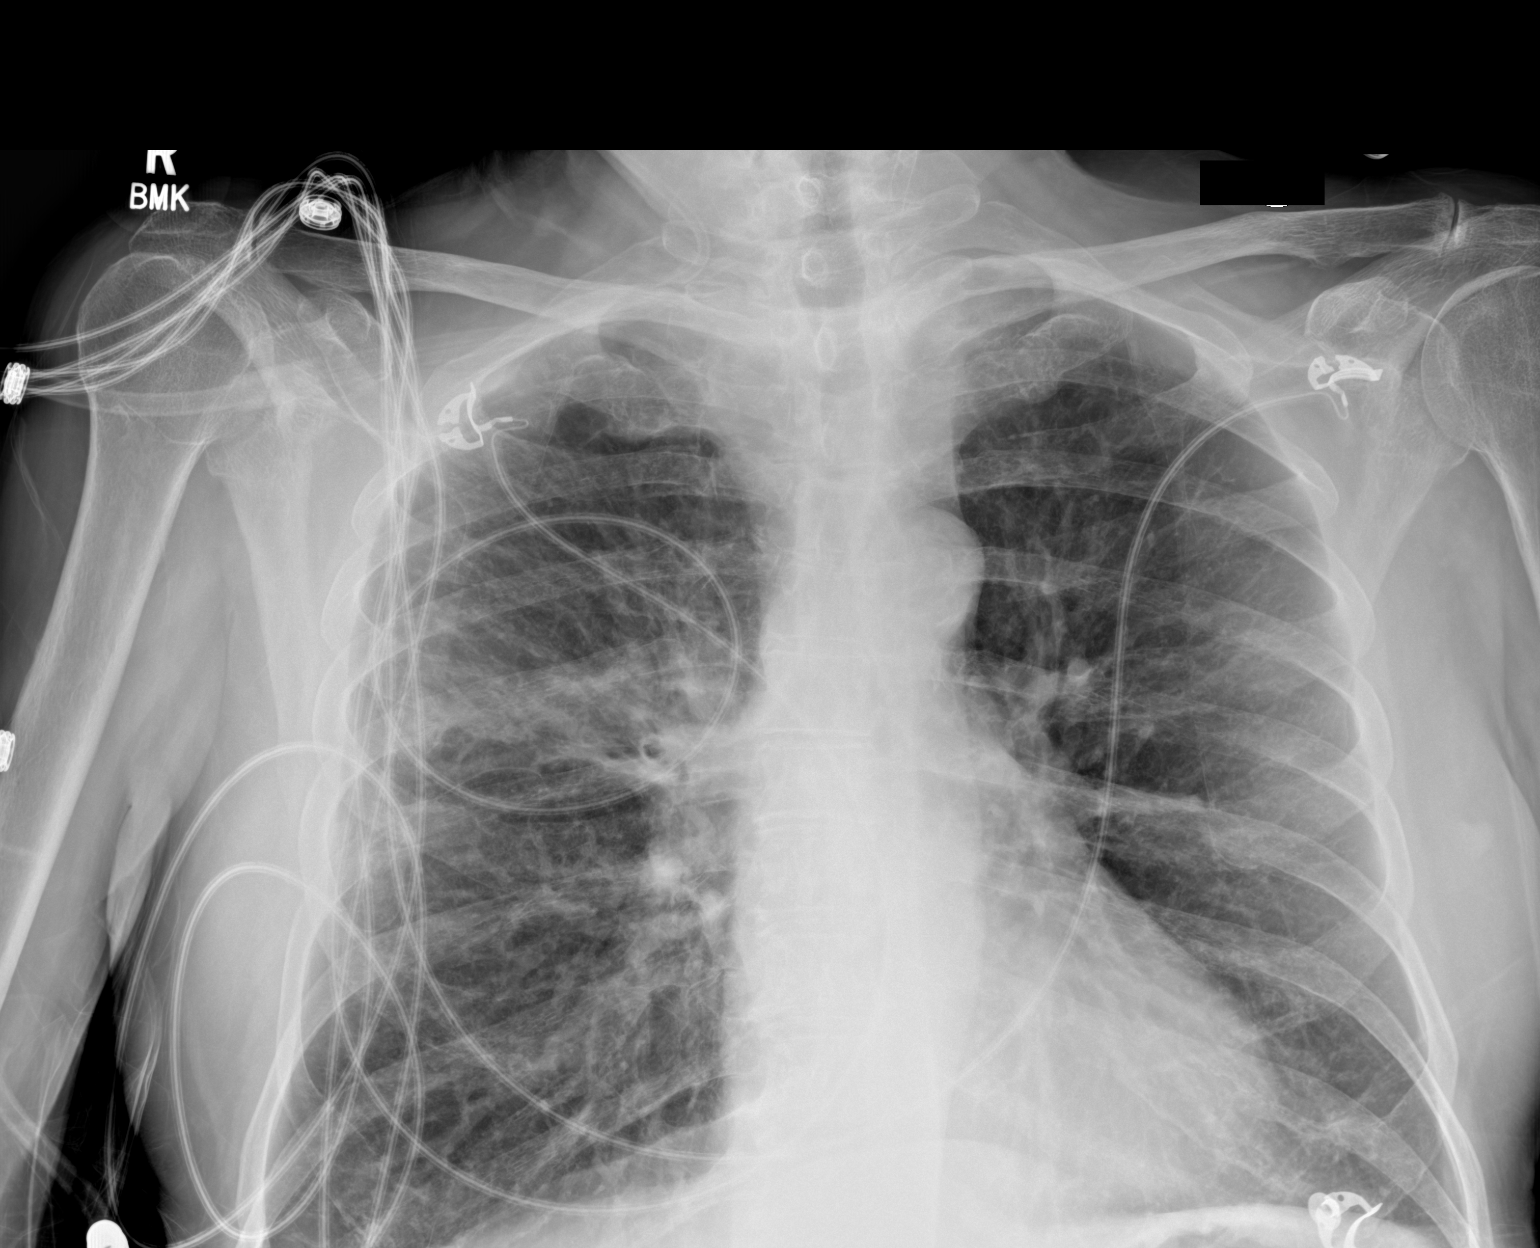

[2 of 2 positions shown; findings below may reference images not displayed]

FINDINGS: Portable AP upright view at [OW] hours. Extubated. Enteric tube
removed. Stable lung volumes. Stable cardiac size and mediastinal
contours. Increased patchy opacity associated with the anterior
right upper lobe cavitary lung lesion demonstrated by CT last month.
Underlying emphysema. Stable coarse pulmonary interstitial markings
elsewhere. No pneumothorax or pleural effusion. No other confluent
opacity. Stable visualized osseous structures.
IMPRESSION: 1. Extubated and enteric tube removed.
2. Increased patchy opacity associated with the right upper lobe
cavitary lung lesion demonstrated by CT last month.
3. Elsewhere stable Emphysema ([OW]-[OW]).

## 2020-11-01 MED ORDER — METHYLPREDNISOLONE SODIUM SUCC 125 MG IJ SOLR
INTRAMUSCULAR | Status: AC
  Administered 2020-11-01: 125 mg
  Filled 2020-11-01: qty 2

## 2020-11-01 MED ORDER — ADULT MULTIVITAMIN W/MINERALS CH
1.0000 | ORAL_TABLET | Freq: Every day | ORAL | Status: DC
  Administered 2020-11-02: 1 via ORAL
  Filled 2020-11-01: qty 1

## 2020-11-01 MED ORDER — MORPHINE SULFATE (PF) 2 MG/ML IV SOLN
2.0000 mg | Freq: Once | INTRAVENOUS | Status: AC
  Administered 2020-11-01: 2 mg via INTRAVENOUS

## 2020-11-01 MED ORDER — LEVALBUTEROL HCL 1.25 MG/0.5ML IN NEBU
1.2500 mg | INHALATION_SOLUTION | Freq: Four times a day (QID) | RESPIRATORY_TRACT | Status: DC | PRN
  Administered 2020-11-02: 1.25 mg via RESPIRATORY_TRACT
  Filled 2020-11-01 (×2): qty 0.5

## 2020-11-01 MED ORDER — LORAZEPAM 2 MG/ML IJ SOLN
INTRAMUSCULAR | Status: AC
  Filled 2020-11-01: qty 1

## 2020-11-01 MED ORDER — ENSURE ENLIVE PO LIQD
237.0000 mL | Freq: Three times a day (TID) | ORAL | Status: DC
  Administered 2020-11-01 – 2020-11-03 (×6): 237 mL via ORAL

## 2020-11-01 MED ORDER — INSULIN ASPART 100 UNIT/ML IJ SOLN
0.0000 [IU] | Freq: Three times a day (TID) | INTRAMUSCULAR | Status: DC
  Administered 2020-11-01: 7 [IU] via SUBCUTANEOUS
  Administered 2020-11-01: 1 [IU] via SUBCUTANEOUS
  Filled 2020-11-01 (×2): qty 1

## 2020-11-01 MED ORDER — SODIUM CHLORIDE 0.9 % IV SOLN
100.0000 mg | Freq: Two times a day (BID) | INTRAVENOUS | Status: DC
  Filled 2020-11-01 (×2): qty 100

## 2020-11-01 MED ORDER — LORAZEPAM 2 MG/ML IJ SOLN
1.0000 mg | Freq: Once | INTRAMUSCULAR | Status: AC
  Administered 2020-11-01: 1 mg via INTRAVENOUS

## 2020-11-01 MED ORDER — DEXTROSE 50 % IV SOLN
1.0000 | Freq: Once | INTRAVENOUS | Status: AC
  Administered 2020-11-01: 50 mL via INTRAVENOUS

## 2020-11-01 MED ORDER — DOXYCYCLINE HYCLATE 100 MG PO TABS
100.0000 mg | ORAL_TABLET | Freq: Two times a day (BID) | ORAL | Status: DC
  Administered 2020-11-01 – 2020-11-03 (×4): 100 mg via ORAL
  Filled 2020-11-01 (×4): qty 1

## 2020-11-01 MED ORDER — METRONIDAZOLE 500 MG/100ML IV SOLN
500.0000 mg | Freq: Three times a day (TID) | INTRAVENOUS | Status: DC
  Filled 2020-11-01 (×3): qty 100

## 2020-11-01 MED ORDER — METHYLPREDNISOLONE SODIUM SUCC 125 MG IJ SOLR: 60.0000 mg | Freq: Once | INTRAMUSCULAR | Status: AC

## 2020-11-01 MED ORDER — METRONIDAZOLE 500 MG PO TABS: 500.0000 mg | ORAL_TABLET | Freq: Two times a day (BID) | ORAL | Status: DC

## 2020-11-01 MED ORDER — ACETAMINOPHEN 650 MG RE SUPP
650.0000 mg | RECTAL | Status: DC | PRN
  Administered 2020-11-01: 650 mg via RECTAL
  Filled 2020-11-01: qty 1

## 2020-11-01 MED ORDER — MORPHINE SULFATE (PF) 2 MG/ML IV SOLN
INTRAVENOUS | Status: AC
  Filled 2020-11-01: qty 1

## 2020-11-01 MED ORDER — INSULIN ASPART 100 UNIT/ML IJ SOLN: 0.0000 [IU] | Freq: Every day | INTRAMUSCULAR | Status: DC

## 2020-11-01 MED ORDER — DEXTROSE 50 % IV SOLN
INTRAVENOUS | Status: AC
  Filled 2020-11-01: qty 50

## 2020-11-01 NOTE — Progress Notes (Signed)
At around 0330, this patient was noted to be short of breath with a RR ranging from 30-40. NP notified at bedside. PRN nebulizer treatment, solu-medrol, morphine, and ativan given to assist in reducing respiratory rate and anxiety. Patient was placed on the BiPAP briefly. After symptoms improved, he was placed back on Kimberly at 3 L.  Patient was complaining of feeling cold, but temp had quickly increased to 103.1 (via temp foley). Ice packs placed under axilla, room temp decreased, and PRN rectal tylenol given to reduce patient temperature.  CBG at 0330 was also shown to be 43. One amp of D 50% given to treat the low CBG. Recheck was found to be 101.   Patient is currently resting and vitals are improving. Will continue to monitor and assess patient.  Cameron Ali, RN

## 2020-11-01 NOTE — Progress Notes (Signed)
ZYSHAUN EIBEN   DOB:07-11-1960   [id number]    Subjective: No acute events overnight.  Continues to complain of shortness of breath on exertion.  Chronic cough.no hemoptysis.  No fevers or chills.  Patient is sitting in the bed having lunch.  Objective:  Vitals:   11/02/20 1949 11/02/20 1951  BP:  134/84  Pulse:  82  Resp:  17  Temp:  98 F (36.7 C)  SpO2: 96% 98%     Intake/Output Summary (Last 24 hours) at 11/03/2020 0034 Last data filed at 11/03/2020 0012 Gross per 24 hour  Intake 1802.63 ml  Output 3150 ml  Net -1347.37 ml    Physical Exam Vitals and nursing note reviewed.  Constitutional:      Comments: Patient resting in the bed.  Accompanied by: none  HENT:     Head: Normocephalic and atraumatic.     Mouth/Throat:     Mouth: Mucous membranes are moist.     Pharynx: No oropharyngeal exudate.  Eyes:     Pupils: Pupils are equal, round, and reactive to light.  Cardiovascular:     Rate and Rhythm: Normal rate and regular rhythm.  Pulmonary:     Effort: No respiratory distress.     Breath sounds: No wheezing.     Comments: Decreased breath sounds bilaterally at bases.  No wheeze or crackles Abdominal:     General: Bowel sounds are normal. There is no distension.     Palpations: Abdomen is soft. There is no mass.     Tenderness: There is no abdominal tenderness. There is no guarding or rebound.  Musculoskeletal:        General: No tenderness. Normal range of motion.     Cervical back: Normal range of motion and neck supple.  Skin:    General: Skin is warm.  Neurological:     Mental Status: He is alert and oriented to person, place, and time.  Psychiatric:        Mood and Affect: Affect normal.        Judgment: Judgment normal.     Labs:  Lab Results  Component Value Date   WBC 11.0 (H) 11/02/2020   HGB 12.2 (L) 11/02/2020   HCT 38.4 (L) 11/02/2020   MCV 91.6 11/02/2020   PLT 233 11/02/2020   NEUTROABS 7.6 11/02/2020    Lab Results  Component Value  Date   NA 142 11/02/2020   K 3.6 11/02/2020   CL 101 11/02/2020   CO2 34 (H) 11/02/2020    Studies:  DG Chest 1 View  Result Date: 11/01/2020 CLINICAL DATA:  60 year old male with shortness of breath. EXAM: CHEST  1 VIEW COMPARISON:  10/30/2020 and earlier. FINDINGS: Portable AP upright view at 0350 hours. Extubated. Enteric tube removed. Stable lung volumes. Stable cardiac size and mediastinal contours. Increased patchy opacity associated with the anterior right upper lobe cavitary lung lesion demonstrated by CT last month. Underlying emphysema. Stable coarse pulmonary interstitial markings elsewhere. No pneumothorax or pleural effusion. No other confluent opacity. Stable visualized osseous structures. IMPRESSION: 1. Extubated and enteric tube removed. 2. Increased patchy opacity associated with the right upper lobe cavitary lung lesion demonstrated by CT last month. 3. Elsewhere stable Emphysema (ICD10-J43.9). Electronically Signed   By: Genevie Ann M.D.   On: 11/01/2020 04:24   CT CHEST WO CONTRAST  Result Date: 11/01/2020 CLINICAL DATA:  Follow-up cavitary pneumonia. EXAM: CT CHEST WITHOUT CONTRAST TECHNIQUE: Multidetector CT imaging of the chest was performed following the  standard protocol without IV contrast. COMPARISON:  10/03/2020 FINDINGS: Cardiovascular: The heart is normal in size. No pericardial effusion. The aorta is normal in caliber. Stable scattered atherosclerotic calcifications. Stable age advanced three-vessel coronary artery calcifications. Mediastinum/Nodes: No mediastinal or hilar mass or adenopathy. Small scattered lymph nodes are stable. The esophagus is grossly normal. Lungs/Pleura: Significant interval improved appearance of the anterior right upper lobe lung abscess. This measured a maximum of 8.4 x 5.7 cm on the prior study and now measures 4.2 x 2.4 cm. There is no residual fluid in the cavity. Moderate persistent air. Some surrounding inflammatory changes persist. Stable  underlying emphysematous changes and pulmonary scarring. No new pulmonary lesions or new pulmonary infiltrates. No pleural effusions or pleural lesions. Upper Abdomen: No significant upper abdominal findings. Musculoskeletal: No chest wall mass, supraclavicular or axillary adenopathy. The bony thorax is intact. IMPRESSION: 1. Significant interval improved appearance of the anterior right upper lobe lung abscess. All fluid in the cavity has resolved. 2. Stable emphysematous changes and pulmonary scarring. 3. No new pulmonary lesions or new pulmonary infiltrates. 4. Stable age advanced three-vessel coronary artery calcifications. 5. Emphysema and aortic atherosclerosis. Aortic Atherosclerosis (ICD10-I70.0) and Emphysema (ICD10-J43.9). Electronically Signed   By: Marijo Sanes M.D.   On: 11/01/2020 13:19    Acquired neutropenia [facility]) #60 year old male patient with history of rheumatoid arthritis/Felty syndrome/LGL leukemia; currently admitted hospital for seizure episode/altered mental status needing intubation.  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  Patient neutrophil count 7.6 status post 2 doses of Granix.  Hold Granix for now.  Restarted Granix if ANC less than 1500.  Patient will need outpatient follow-up for chronic outpatient Granix every 1 to 2 weeks.  Also will need further work-up for his LGL leukemia/treatment options.   #MRSA-lung abscess s/p IV vancomycin-worsening noted question underlying fungal infection-antibiotics as per primary team.  #Seizure activity/altered mental status needing intubation-sedation s/p extubation.  UDS positive for cocaine.   Cammie Sickle, MD

## 2020-11-01 NOTE — Consult Note (Signed)
PHARMACY CONSULT NOTE  Pharmacy Consult for Electrolyte Monitoring and Replacement   Recent Labs: Potassium (mmol/L)  Date Value  11/01/2020 3.7   Magnesium (mg/dL)  Date Value  11/01/2020 1.9   Calcium (mg/dL)  Date Value  11/01/2020 8.6 (L)   Albumin (g/dL)  Date Value  10/30/2020 3.1 (L)   Phosphorus (mg/dL)  Date Value  11/01/2020 2.9   Sodium (mmol/L)  Date Value  11/01/2020 140   Assessment: Patient is a 60 y/o M with medical history including Felty's syndrome, cavitary pneumonia, chronic respiratory failure, tobacco abuse, GIB / esophageal varices, history of splenectomy, RA who is admitted with acute respiratory failure, sepsis, drug intoxication, and possible seizure activity. Pharmacy consulted to assist with electrolyte monitoring and replacement as indicated.  Patient extubated 7/27  Goal of Therapy:  Electrolytes within normal limits  Plan:  --No electrolyte replacement warranted at this time --Follow-up electrolytes with AM labs tomorrow --Patient is a high re-feed risk per RD assessment  Benita Gutter 11/01/2020 8:06 AM

## 2020-11-01 NOTE — Progress Notes (Signed)
Nutrition Follow Up Note   DOCUMENTATION CODES:   Severe malnutrition in context of chronic illness  INTERVENTION:   Ensure Enlive po TID, each supplement provides 350 kcal and 20 grams of protein  Magic cup TID with meals, each supplement provides 290 kcal and 9 grams of protein  MVI po daily   Dysphagia 3 diet   Pt at high refeed risk; recommend monitor potassium, magnesium and phosphorus labs daily until stable  NUTRITION DIAGNOSIS:   Severe Malnutrition related to chronic illness (COPD, cirrhosis, substance abuse) as evidenced by mild fat depletion, severe fat depletion, moderate muscle depletion, severe muscle depletion.  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  Pt extubated 7/27 and placed on a regular diet. RD will add supplements and MVI to help pt meet his estimated needs. RD will change pt to a mechanical soft diet as pt is edentulous. Pt is likely at refeed risk. Per chart, pt is down ~3lbs(2%) since admit.  Medications reviewed and include: heparin, insulin, nicotine, protonix, ceftriaxone, vancomycin   Labs reviewed: K 3.7 wnl, creat 0.57(L), P 2.9 wnl, Mg 1.9 wnl Wbc- 2.6(L) Cbgs- 40, 43, 101, 93 x 24 hrs  Diet Order:   Diet Order             Diet regular Room service appropriate? Yes; Fluid consistency: Thin  Diet effective now                  EDUCATION NEEDS:   No education needs have been identified at this time  Skin:  Skin Assessment: Reviewed RN Assessment (ecchymosis)  Last BM:  7/27- TYPE 1  Height:   Ht Readings from Last 1 Encounters:  10/30/20 5' 9.02" (1.753 m)    Weight:   Wt  Readings from Last 1 Encounters:  11/01/20 59.2 kg    Ideal Body Weight:  72.7 kg  BMI:  Body mass index is 19.26 kg/m.  Estimated Nutritional Needs:   Kcal:  1900-2200kcal/day  Protein:  95-110g/day  Fluid:  1.8-2.1L/day  Koleen Distance MS, RD, LDN Please refer to [facility] for RD and/or RD on-call/weekend/after hours pager
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
## 2020-11-01 NOTE — Progress Notes (Signed)
ICU acceptance note  60 y.o. with Acute Hypoxic Hypercapnic Respiratory Failure due to Acute Metabolic Encephalopathy, suspected seizures, drug intoxication (UDS + cocaine), and sepsis (known Cavitary Pneumonia). Required intubation in ED, successfully extubated yesterday and tolerating. Off vasopressors. Consulting ID for cavitary PNA and repeat CT Chest today. Hematology is following forNeutropenia and history of Felty syndrome. Now Stepdown status.  [facility] to assume primary care of this patient on 7/29  Ralene Muskrat MD  No charge

## 2020-11-01 NOTE — Progress Notes (Addendum)
NAME:  Marcus [name], MRN:  [id number], DOB:  Aug 13, 1960, LOS: 2 ADMISSION DATE:  10/30/2020, CONSULTATION DATE:  10/30/2020 REFERRING MD: Archie Balboa MD, CHIEF COMPLAINT: Seizure like Activity   HPI  60 y.o with significant PMH as below who presented to the ED with seizure like activity.  Per EMS run sheet,  patient was driving when he had a seizure like activity per bystander. On EMS arrival to the scene, patient was lying out the side of the vehicle unresponsive with rapid, shallow, and ineffective with decorticate posturing and [place] respirations. Pt's skin was pale and cyanosis noted. Bystander on scene advised Pt was driving when he presented with a seizure lasting approx 10 minutes. Pt noted to be incontinent with no reports of tongue laceration or obvious injuries.  Pt was administered 2 mg Versed via IV push with no effect. Pt transferred from Nasal Cannula to NRB with no significant changes noted in SpO2. During transport, OPA was attempted with no success. 56f NPA secured and BVM assisted ventilation initiated. Pt administered an additional 2 mg Versed via IV push and transported ot the ED.  ED Course: On arrival to the ED, he was febrile 102.1 (38.9) with blood pressure (!) 173/94 mm Hg and pulse rate (!) 145 beats/mi, RR (!) 30. Patient was unresponsive to painful stimulus.  Given patient's mental status and inability to maintain airway he was emergently intubated in the ED for airway protection. Sepsis work also initiated in the ED.  PCCM consulted for management.  Labs/Diagnostics WBC/Hgb/Hct/Plts:  2.6/11.8/36.9/216 (07/28 0516)  Na+133, Glucose 117, alk phos 240, anion gap 16 Troponin 28 EKG: Rate: 148, Rhythm: sinus tachycardia, Axis: left axis deviation,Intervals: qtc 589, QRS: RBBB, LAFB ST changes: no st elevation CXR: Some persisting cavitary lesion in the medial right lung with reticulonodular opacities in the right upper lung UA: Negative. 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 Repeat CT Chest, obtain ID consult.  Change to Stepdown status, possible transfer out of ICU today  Consults:  Hematology Infectious Diseases  Procedures:  7/26: Intubation  Significant Diagnostic Tests:  7/26: Chest Xray>Some persisting cavitary lesion in the medial right lung with reticulonodular opacities in the right upper lung 7/26: Noncontrast CT head> no acute intracranial abnormality 7/28: Chest Xray>>1. Extubated and enteric tube removed. 2. Increased patchy opacity associated with the right upper lobe cavitary lung lesion demonstrated by CT last month. 3. Elsewhere stable Emphysema  Micro Data:  7/26: SARS-CoV-2 PCR> negative 7/26: Influenza PCR> negative 7/26: Blood culture x2> no growth to date 7/26: Urine Culture> not collected 7/26: MRSA PCR>>negative  7/26: Strep pneumo urinary antigen>negative 7/26: Legionella urinary antigen> 7/26: Mycoplasma pneumonia> 7/27: Tracheal aspirate>>  Antimicrobials:  Vancomycin 7/26>> Cefepime 7/26>>7/27 Ceftriaxone 7/27>> 7/26 Metronidazole>7/26  OBJECTIVE  Blood pressure (!) 86/56, pulse (!) 106, temperature (!) 102.92 F (39.4 C), resp. rate 20, height 5' 9.02" (1.753 m), weight 59.2 kg, SpO2 95 %.    Vent Mode: PSV FiO2 (%):  [30 %] 30 % Set  Rate:  [20 bmp] 20 bmp Vt Set:  [450 mL] 450 mL PEEP:  [5 cmH20] 5 cmH20 Pressure Support:  [8 cmH20] 8 cmH20    Intake/Output Summary (Last 24 hours) at 11/01/2020 0849 Last data filed at 11/01/2020 0400 Gross per 24 hour  Intake 735.38 ml  Output 2400 ml  Net -1664.62 ml    Filed Weights   10/30/20 2047 10/31/20 0350 11/01/20 0419  Weight: 60.5 kg 61 kg 59.2 kg    Physical Examination  GENERAL: Acute on chronically ill patient sitting in the bed, sleeping, on 3L [place], in NAD EYES: Pupils equal, round, reactive to light and accommodation. No scleral icterus. Extraocular muscles intact.  HEENT: Head atraumatic, normocephalic. Oropharynx and nasopharynx clear.  NECK:  Supple, no jugular venous distention. No thyroid enlargement, no tenderness.  LUNGS: Clear breath sounds bilaterally; no wheezing, rales ,rhonchi no crepitation. Even, nonlabored, normal effort CARDIOVASCULAR: S1, S2, regular rate and rhythm. No murmurs, rubs, or gallops.  ABDOMEN: Soft, nontender, nondistended. Bowel sounds present. No organomegaly or mass.  EXTREMITIES: No pedal edema, cyanosis, or clubbing.  NEUROLOGIC: Cranial nerves II through XII are intact.  Muscle strength 5/5 in all extremities. Sensation intact. Gait not checked.  SKIN: Warm and dry. No obvious rash, lesion, or ulcer.   Labs/imaging that I havepersonally reviewed  (right click and "Reselect all SmartList Selections" daily)     Labs   CBC: Recent Labs  Lab 10/30/20 2048 10/31/20 0424 11/01/20 0516  WBC 17.7* 3.8* 2.6*  NEUTROABS 0.1*  --  1.6*  HGB 13.6 12.2* 11.8*  HCT 42.8 37.5* 36.9*  MCV 94.3 91.5 92.7  PLT 279 270 216     Basic Metabolic Panel: Recent Labs  Lab 10/30/20 2048 10/31/20 0424 11/01/20 0516  NA 133* 136 140  K 3.9 3.5 3.7  CL 95* 99 103  CO2 _0 GLUCOSE 117* 123* 80  BUN _1 CREATININE 0.73 0.55* 0.57*  CALCIUM 8.5* 8.2* 8.6*  MG  --  2.0 1.9  PHOS  --  4.5 2.9    GFR: Estimated Creatinine Clearance: 83.3 mL/min (A) (by C-G formula based on SCr of 0.57 mg/dL (L)). Recent Labs  Lab 10/30/20 2048  10/30/20 2247 10/31/20 0424 11/01/20 0516  PROCALCITON  --  0.22 0.32 0.33  WBC 17.7*  --  3.8* 2.6*  LATICACIDVEN 10.0* 2.3*  --   --      Liver Function Tests: Recent Labs  Lab 10/30/20 2048  AST 39  ALT 21  ALKPHOS 240*  BILITOT 0.8  PROT 7.5  ALBUMIN 3.1*    No results for input(s): LIPASE, AMYLASE in the last 168 hours. Recent Labs  Lab 10/30/20 2135 11/01/20 0516  AMMONIA 51* 36*     ABG    Component Value Date/Time   PHART 7.45 10/31/2020 0504   PCO2ART 46 10/31/2020 0504   PO2ART 48 (L) 10/31/2020 0504   HCO3 31.0 (H) 11/01/2020 0528   TCO2 24 10/01/2020 1720   ACIDBASEDEF 6.6 (H) 10/30/2020 2106   O2SAT 93.0 11/01/2020 0528      Coagulation Profile: Recent Labs  Lab 10/30/20 2048  INR 1.2     Cardiac Enzymes: No results for input(s): CKTOTAL, CKMB, CKMBINDEX, TROPONINI in the last 168 hours.  HbA1C: Hgb A1c MFr Bld  Date/Time Value Ref Range Status  10/31/2020 04:24 AM 5.7 (H) 4.8 - 5.6 % Final    Comment:    (NOTE)  Prediabetes: 5.7 - 6.4         Diabetes: >6.4         Glycemic control for adults with diabetes: <7.0   07/24/2015 01:28 PM  4.0 - 6.0 % Final   UNABLE TO REPORT A1C DUE TO UNKNOWN INTERFERING FACTOR CAUSING THE ANALYTICAL RANGE TO BE OUTSIDE OF ANALYZER RANGE SAMPLE SENT TO [facility] FOR AN ALTERNATIVE METHOD    CBG: Recent Labs  Lab 10/31/20 2319 11/01/20 0325 11/01/20 0328 11/01/20 0359 11/01/20 0722  GLUCAP 92 40* 43* 101* 93    Review of Systems:   Positives in BOLD: Gen: Denies fever, chills, weight change, fatigue, night sweats HEENT: Denies blurred vision, double vision, hearing loss, tinnitus, sinus congestion, rhinorrhea, sore throat, neck stiffness, dysphagia PULM: Denies shortness of breath, cough, sputum production, hemoptysis, wheezing CV: Denies chest pain, edema, orthopnea, paroxysmal nocturnal dyspnea, palpitations GI: Denies abdominal pain, nausea, vomiting, diarrhea, hematochezia,  melena, constipation, change in bowel habits GU: Denies dysuria, hematuria, polyuria, oliguria, urethral discharge Endocrine: Denies hot or cold intolerance, polyuria, polyphagia or appetite change Derm: Denies rash, dry skin, scaling or peeling skin change Heme: Denies easy bruising, bleeding, bleeding gums Neuro: Denies headache, numbness, weakness, slurred speech, loss of memory or consciousness   Past Medical History  He,  has a past medical history of COPD (chronic obstructive pulmonary disease) (HCC), Felty syndrome (HCC), Hernia, epigastric, Pancytopenia ([place]), Seropositive rheumatoid arthritis ([place]), and Tobacco use disorder.   Surgical History    Past Surgical History:  Procedure Laterality Date   ESOPHAGOGASTRODUODENOSCOPY (EGD) WITH PROPOFOL N/A 10/01/2020   Procedure: ESOPHAGOGASTRODUODENOSCOPY (EGD) WITH PROPOFOL;  Surgeon: Mauri Pole, MD;  Location: [facility];  Service: Endoscopy;  Laterality: N/A;   HEMOSTASIS CLIP PLACEMENT  10/01/2020   Procedure: HEMOSTASIS CLIP PLACEMENT;  Surgeon: Mauri Pole, MD;  Location: [facility];  Service: Endoscopy;;   INCISIONAL HERNIA REPAIR  01/17/2020   Procedure: HERNIA REPAIR INCISIONAL AND Silvestre Gunner;  Surgeon: Kinsinger, Arta Bruce, MD;  Location: [facility];  Service: General;;   IR ANGIOGRAM SELECTIVE EACH ADDITIONAL VESSEL  10/01/2020   IR ANGIOGRAM SELECTIVE EACH ADDITIONAL VESSEL  10/01/2020   IR ANGIOGRAM VISCERAL SELECTIVE  10/01/2020   IR ANGIOGRAM VISCERAL SELECTIVE  10/01/2020   IR US GUIDE [place] RIGHT  10/01/2020   LAPAROTOMY N/A 01/17/2020   Procedure: EXPLORATORY LAPAROTOMY;  Surgeon: Mickeal Skinner, MD;  Location: [facility];  Service: General;  Laterality: N/A;    Social History   reports that he has been smoking cigarettes. He has been smoking an average of 1 pack per day. He has never used smokeless tobacco. He reports current drug use. Drug: Marijuana. He reports that he does not drink alcohol.    Family History   His family history includes CAD in his brother; COPD in his brother.   Allergies Allergies  Allergen Reactions   Levaquin [Levofloxacin In D5w] Other (See Comments)    Chest pain   Methotrexate Other (See Comments)    Chest pain     Home Medications  Prior to Admission medications   Medication Sig Start Date End Date Taking? Authorizing Provider  albuterol (PROVENTIL) (2.5 MG/3ML) 0.083% nebulizer solution Take 3 mLs (2.5 mg total) by nebulization every 6 (six) hours as needed for wheezing or shortness of breath. 08/30/20   Kathie Dike, MD  albuterol (VENTOLIN HFA) 108 (90 Base) MCG/ACT inhaler Inhale 2 puffs into the lungs every 6 (six) hours as needed for wheezing or shortness of  breath. 08/30/20   Kathie Dike, MD  feeding supplement (ENSURE ENLIVE / ENSURE PLUS) LIQD Take 237 mLs by mouth 2 (two) times daily between meals. 08/04/20   Manuella Ghazi, Pratik D, DO  mometasone-formoterol (DULERA) 100-5 MCG/ACT AERO Inhale 2 puffs into the lungs 2 (two) times daily. 08/30/20   Kathie Dike, MD  mupirocin ointment (BACTROBAN) 2 % Place into the nose 2 (two) times daily. 09/28/20   Nolberto Hanlon, MD  pantoprazole (PROTONIX) 40 MG tablet Take 1 tablet (40 mg total) by mouth 2 (two) times daily. 10/07/20   Hosie Poisson, MD  predniSONE (DELTASONE) 10 MG tablet Take 1 tablet (10 mg total) by mouth 2 (two) times daily with a meal. 08/30/20   Kathie Dike, MD  Scheduled Meds:  budesonide (PULMICORT) nebulizer solution  0.25 mg Nebulization BID   Chlorhexidine Gluconate Cloth  6 each Topical Daily   heparin injection (subcutaneous)  5,000 Units Subcutaneous Q8H   insulin aspart  0-9 Units Subcutaneous Q4H   mouth rinse  15 mL Mouth Rinse BID   nicotine  21 mg Transdermal Daily   pantoprazole (PROTONIX) IV  40 mg Intravenous Daily   Tbo-filgastrim (GRANIX) SQ  480 mcg Subcutaneous Daily   Continuous Infusions:  cefTRIAXone (ROCEPHIN)  IV Stopped (10/31/20 1808)    norepinephrine (LEVOPHED) Adult infusion Stopped (10/31/20 1259)   vancomycin Stopped (10/31/20 2216)   PRN Meds:.acetaminophen, docusate sodium, levalbuterol, polyethylene glycol  Active Hospital Problem list   Acute hypoxic hypercapnic respiratory failure Sepsis with septic shock AGMA Acute metabolic encephalopathy Seizure-like activity Felty syndrome Polysubstance abuse disorder  Assessment & Plan:  Acute Hypoxic / Hypercapnic Respiratory Failure secondary in the setting of  suspected seizures, sepsis and drug intoxication (UDS +Cocaine) Known Cavitary Pneumonia PMHx: COPD, tobacco abuse -Extubated 7/27 -Supplemental O2 as needed to maintain O2 sats >88% - Intermittent chest x-ray & ABG PRN - Ensure adequate pulmonary hygiene  - Budesonide nebs BID, bronchodilators PRN -Continue Ceftriaxone and Vancomycin for recent Cavitary Pneumonia -Discussed with Dr. Mortimer Fries, will repeat CT Chest  Sepsis with septic shock due to unknown source>>resolved Recent Cavitary Pneumonia (MRSA / Prevotella in Lungs, Klebsiella PNA Bacteremia) -Monitor fever curve -Trend WBC's & Procalcitonin -Follow cultures as above -Continue empiric Ceftriaxone & Vancomycin pending cultures & sensitivities -Consult ID, appreciate input  Neutropenia PMHx of Felty Syndrome -Hematology following, appreciate input -Flow Cytometry pending -Continue Granix as per Hematology   Acute Metabolic Encephalopathy due to Hypercapnia, seizures Polysubstance Abuse UDS +Cocaine Hx tobbacco abuse -Seizures felt to be attributed to Cocaine use, along with Cefepime & Flagyl (were used in treatment of lung abscess) as these co-founding factors have the potential for lowering seizure threshold. -Provide supportive care - CT head no acute intracranial abnormality. Consider MRI if appropriate - EEG 10/31/20 suggestive of mild diffuse encephalopathy. No seizures or epileptiform discharges were seen throughout the recording. The  excessive beta activity seen in the background is most likely due to the effect of benzodiazepine and is a benign EEG pattern. - Seizure precautions - Ativan prn seizure activity - Consider Neurology Consult - Substance abuse Counseling  - Encourage smoking cessation    Best practice:  Diet:  regular diet Pain/Anxiety/Delirium protocol (if indicated): N/A VAP protocol (if indicated): N/A DVT prophylaxis: Heparin SQ GI prophylaxis: PPI Glucose control:  SSI No Central venous access:  N/A Arterial line:  N/A Foley:  N/A Mobility:  OOB as tolerated PT consulted: yes Last date of multidisciplinary goals of care discussion [7/28] Code Status:  full  code Disposition: SD status  Updated pt at bedside 7/28.  All questions answered.    Darel Hong, AGACNP-BC  Pulmonary & Critical Care Prefer epic messenger for cross cover needs If after hours, please call E-link   ICU ATTENDING ATTESTATION:  Patient seen and examined and relevant ancillary tests reviewed.   I agree with the assessment and plan of care as outlined by Darel Hong NP.  This patient was not seen as a shared visit. The following reflects my independent critical care time.  I  personally  reviewed database in its entirety and discussed care plan in detail. In addition, this patient was discussed on multidisciplinary rounds.   I agree with assessment and plan.  Admitted for COCAINE POISONING, RESP FAILURE CT reviewed RT sided cavitary lesion likely fungal ball  PHYSICAL EXAMINATION:  GENERAL:ill appearing NECK: Supple.  PULMONARY: +rhonchi  RESP FAILURE RESOLVING ID consultation follow up TRANSFER TO [place], M.D.  Velora Heckler Pulmonary & Critical Care Medicine  Medical Director [place] Director [facility] Cardio-Pulmonary Department

## 2020-11-02 DIAGNOSIS — J9610 Chronic respiratory failure, unspecified whether with hypoxia or hypercapnia: Secondary | ICD-10-CM | POA: Diagnosis not present

## 2020-11-02 DIAGNOSIS — G4089 Other seizures: Secondary | ICD-10-CM | POA: Diagnosis not present

## 2020-11-02 DIAGNOSIS — C91Z0 Other lymphoid leukemia not having achieved remission: Secondary | ICD-10-CM

## 2020-11-02 DIAGNOSIS — D696 Thrombocytopenia, unspecified: Secondary | ICD-10-CM | POA: Diagnosis not present

## 2020-11-02 DIAGNOSIS — R569 Unspecified convulsions: Secondary | ICD-10-CM

## 2020-11-02 DIAGNOSIS — D709 Neutropenia, unspecified: Secondary | ICD-10-CM

## 2020-11-02 DIAGNOSIS — F1490 Cocaine use, unspecified, uncomplicated: Secondary | ICD-10-CM | POA: Diagnosis not present

## 2020-11-02 LAB — CBC WITH DIFFERENTIAL/PLATELET
Abs Immature Granulocytes: 0.12 10*3/uL — ABNORMAL HIGH (ref 0.00–0.07)
Basophils Absolute: 0.1 10*3/uL (ref 0.0–0.1)
Basophils Relative: 1 %
Eosinophils Absolute: 0 10*3/uL (ref 0.0–0.5)
Eosinophils Relative: 0 %
HCT: 38.4 % — ABNORMAL LOW (ref 39.0–52.0)
Hemoglobin: 12.2 g/dL — ABNORMAL LOW (ref 13.0–17.0)
Immature Granulocytes: 1 %
Lymphocytes Relative: 14 %
Lymphs Abs: 1.5 10*3/uL (ref 0.7–4.0)
MCH: 29.1 pg (ref 26.0–34.0)
MCHC: 31.8 g/dL (ref 30.0–36.0)
MCV: 91.6 fL (ref 80.0–100.0)
Monocytes Absolute: 1.6 10*3/uL — ABNORMAL HIGH (ref 0.1–1.0)
Monocytes Relative: 15 %
Neutro Abs: 7.6 10*3/uL (ref 1.7–7.7)
Neutrophils Relative %: 69 %
Platelets: 233 10*3/uL (ref 150–400)
RBC: 4.19 MIL/uL — ABNORMAL LOW (ref 4.22–5.81)
RDW: 18.9 % — ABNORMAL HIGH (ref 11.5–15.5)
Smear Review: NORMAL
WBC Morphology: INCREASED
WBC: 11 10*3/uL — ABNORMAL HIGH (ref 4.0–10.5)
nRBC: 0.3 % — ABNORMAL HIGH (ref 0.0–0.2)

## 2020-11-02 LAB — BASIC METABOLIC PANEL
Anion gap: 7 (ref 5–15)
BUN: 18 mg/dL (ref 6–20)
CO2: 34 mmol/L — ABNORMAL HIGH (ref 22–32)
Calcium: 8.6 mg/dL — ABNORMAL LOW (ref 8.9–10.3)
Chloride: 101 mmol/L (ref 98–111)
Creatinine, Ser: 0.41 mg/dL — ABNORMAL LOW (ref 0.61–1.24)
GFR, Estimated: >60 mL/min (ref >60)
Glucose, Bld: 116 mg/dL — ABNORMAL HIGH (ref 70–99)
Potassium: 3.6 mmol/L (ref 3.5–5.1)
Sodium: 142 mmol/L (ref 135–145)

## 2020-11-02 LAB — CULTURE, RESPIRATORY W GRAM STAIN: Culture: NO GROWTH

## 2020-11-02 LAB — GLUCOSE, CAPILLARY
Glucose-Capillary: 131 mg/dL — ABNORMAL HIGH (ref 70–99)
Glucose-Capillary: 92 mg/dL (ref 70–99)
Glucose-Capillary: 141 mg/dL — ABNORMAL HIGH (ref 70–99)
Glucose-Capillary: 110 mg/dL — ABNORMAL HIGH (ref 70–99)
Glucose-Capillary: 126 mg/dL — ABNORMAL HIGH (ref 70–99)

## 2020-11-02 LAB — PHOSPHORUS: Phosphorus: 1.4 mg/dL — ABNORMAL LOW (ref 2.5–4.6)

## 2020-11-02 LAB — MAGNESIUM: Magnesium: 1.9 mg/dL (ref 1.7–2.4)

## 2020-11-02 MED ORDER — PANTOPRAZOLE SODIUM 40 MG PO TBEC
40.0000 mg | DELAYED_RELEASE_TABLET | Freq: Every day | ORAL | Status: DC
  Administered 2020-11-03: 40 mg via ORAL
  Filled 2020-11-02: qty 1

## 2020-11-02 MED ORDER — POTASSIUM PHOSPHATES 15 MMOLE/5ML IV SOLN
24.0000 mmol | Freq: Once | INTRAVENOUS | Status: AC
  Administered 2020-11-02: 24 mmol via INTRAVENOUS
  Filled 2020-11-02: qty 8

## 2020-11-02 MED ORDER — TRAMADOL HCL 50 MG PO TABS
50.0000 mg | ORAL_TABLET | Freq: Two times a day (BID) | ORAL | Status: DC | PRN
  Administered 2020-11-02: 50 mg via ORAL
  Filled 2020-11-02: qty 1

## 2020-11-02 NOTE — Consult Note (Signed)
PHARMACY CONSULT NOTE  Pharmacy Consult for Electrolyte Monitoring and Replacement   Recent Labs: Potassium (mmol/L)  Date Value  11/02/2020 3.6   Magnesium (mg/dL)  Date Value  11/02/2020 1.9   Calcium (mg/dL)  Date Value  11/02/2020 8.6 (L)   Albumin (g/dL)  Date Value  10/30/2020 3.1 (L)   Phosphorus (mg/dL)  Date Value  11/02/2020 1.4 (L)   Sodium (mmol/L)  Date Value  11/02/2020 142   Corr Ca: 9.32 mg/dL  Assessment: Patient is a 60 y/o M with medical history including Felty's syndrome, cavitary pneumonia, chronic respiratory failure, tobacco abuse, GIB / esophageal varices, history of splenectomy, RA who is admitted with acute respiratory failure, sepsis, drug intoxication, and possible seizure activity. Pharmacy consulted to assist with electrolyte monitoring and replacement as indicated.  Patient extubated 7/27  Goal of Therapy:  Electrolytes within normal limits  Plan:  --Phos 1.4 - will order 24 mmol IV Kphos --Follow-up electrolytes with AM labs tomorrow --Patient is a high re-feed risk per RD assessment  Blimi Godby O Austyn Seier 11/02/2020 7:02 AM
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
## 2020-11-02 NOTE — TOC Initial Note (Signed)
Transition of Care [facility]) - Initial/Assessment Note    Patient Details  Name: JCION PROHASKA MRN: [id number] Date of Birth: 09/21/1960  Transition of Care [facility]) CM/SW Contact:    Alberteen Sam, LCSW Phone Number: 11/02/2020, 10:19 AM  Clinical Narrative:                  CSW spoke with patient to complete readmission risk assessment and consult for substance abuse.   Patient confirms he continues to see Dr Andree Elk as his PCP, reports he has home O2 through [place] and that he has no problems getting his medications or to and from appointments. Patient offered substance abuse resources, patient declined at this time and reports he has no needs.   Expected Discharge Plan: Home/Self Care Barriers to Discharge: Continued Medical Work up   Patient Goals and CMS Choice Patient states their goals for this hospitalization and ongoing recovery are:: to go home CMS Medicare.gov Compare Post Acute Care list provided to:: Patient Choice offered to / list presented to : Patient  Expected Discharge Plan and Services Expected Discharge Plan: Home/Self Care       Living arrangements for the past 2 months: Single Family Home                                      Prior Living Arrangements/Services Living arrangements for the past 2 months: Single Family Home Lives with:: Self                   Activities of Daily Living      Permission Sought/Granted                  Emotional Assessment   Attitude/Demeanor/Rapport: Gracious Affect (typically observed): Calm Orientation: : Oriented to Self, Oriented to Place, Oriented to  Time, Oriented to Situation Alcohol / Substance Use: Illicit Drugs Psych Involvement: No (comment)  Admission diagnosis:  Unresponsive [R41.89] Acute respiratory failure with hypoxia ([place]) [J96.01] Seizure-like activity ([place]) [R56.9] Patient Active Problem List   Diagnosis Date Noted   Acute respiratory failure with hypoxia ([place]) 10/30/2020    Cirrhosis of liver ([place])    GI bleed 10/01/2020   Hemorrhagic shock ([place]) 10/01/2020   Acute blood loss anemia 10/01/2020   Bleeding gastric varices    Bacteremia    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[place])    Massive hemoptysis 09/30/2020   AKI (acute kidney injury) ([place]) 09/30/2020   Protein-calorie malnutrition, severe 09/19/2020   Lung abscess ([place]) 09/18/2020   Respiratory failure with hypoxia ([place]) 09/17/2020   Hemoptysis 09/17/2020   Acquired neutropenia ([place]) 08/28/2020   Hypothermia 08/24/2020   Cavitary pneumonia 08/22/2020   Acute on chronic respiratory failure with hypoxia ([place]) 08/22/2020   Hyponatremia 08/04/2020   Generalized weakness 08/04/2020   Dehydration 08/03/2020   Septic shock ([place]) 06/29/2020   CAP (community acquired pneumonia) 06/29/2020   COPD exacerbation ([place]) 06/11/2020   Chest pain 06/11/2020   Cellulitis 06/11/2020   Chronic diastolic CHF (congestive heart failure) ([place]) 06/11/2020   Sepsis due to undetermined organism ([place]) 01/20/2020   Acute hypoxemic respiratory failure ([place]) 01/20/2020   Hypokalemia 01/20/2020   Perforated prepyloric ulcer 01/18/2020 01/18/2020   Pneumoperitoneum 01/17/2020   COPD with acute exacerbation ([place]) 08/18/2019   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) 07/24/2015   Pancytopenia ([place]) 07/23/2015   Felty syndrome ([place]) 06/06/2013   Leukopenia 10/21/2012   Axillary abscess 10/21/2012   Abnormal EKG 10/21/2012   Joint pain 10/21/2012   Splenomegaly 10/21/2012   PCP:  [name], FNP Pharmacy:   [id number] [place] [address] [place]Phone: [phone]: [phone]- [address]Phone: [phone]:  [phone]    Social Determinants of Health (SDOH) Interventions    Readmission Risk Interventions Readmission Risk Prevention Plan 09/20/2020 08/25/2020  Transportation Screening Complete Complete  PCP or Specialist Appt within 3-5 Days - Complete  HRI or [place]- Complete  Social Work Consult for [place]anning/Counseling - Complete  Palliative Care Screening - Complete  Medication Review (Press photographer Complete Complete  PCP or Specialist appointment within 3-5 days of discharge Complete -  [place] [facility] Care Consult Complete -  Palliative Care Screening Not Applicable -  SPen [place] Applicable -  Some recent data might be hidden

## 2020-11-02 NOTE — Progress Notes (Signed)
Pt is A&O, VS stable, NSR on the monitor. IVx2 in place. transferred from ICU midshift. No complaints of pain or discomfort. 2L [place], q4h CBG, had large BM that was loose and smelled like cdiff. On precautions for MRSA already, added contact precautions also. Will defer to dayshift for collection.
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
## 2020-11-03 DIAGNOSIS — R0602 Shortness of breath: Secondary | ICD-10-CM

## 2020-11-03 DIAGNOSIS — R4189 Other symptoms and signs involving cognitive functions and awareness: Secondary | ICD-10-CM

## 2020-11-03 LAB — BASIC METABOLIC PANEL
Anion gap: 7 (ref 5–15)
BUN: 18 mg/dL (ref 6–20)
CO2: 33 mmol/L — ABNORMAL HIGH (ref 22–32)
Calcium: 8.9 mg/dL (ref 8.9–10.3)
Chloride: 95 mmol/L — ABNORMAL LOW (ref 98–111)
Creatinine, Ser: 0.4 mg/dL — ABNORMAL LOW (ref 0.61–1.24)
GFR, Estimated: >60 mL/min (ref >60)
Glucose, Bld: 96 mg/dL (ref 70–99)
Potassium: 4.3 mmol/L (ref 3.5–5.1)
Sodium: 135 mmol/L (ref 135–145)

## 2020-11-03 LAB — CBC WITH DIFFERENTIAL/PLATELET
Abs Immature Granulocytes: 0.3 10*3/uL — ABNORMAL HIGH (ref 0.00–0.07)
Basophils Absolute: 0.1 10*3/uL (ref 0.0–0.1)
Basophils Relative: 2 %
Eosinophils Absolute: 0 10*3/uL (ref 0.0–0.5)
Eosinophils Relative: 1 %
HCT: 40.3 % (ref 39.0–52.0)
Hemoglobin: 13.1 g/dL (ref 13.0–17.0)
Immature Granulocytes: 9 %
Lymphocytes Relative: 32 %
Lymphs Abs: 1.1 10*3/uL (ref 0.7–4.0)
MCH: 29.4 pg (ref 26.0–34.0)
MCHC: 32.5 g/dL (ref 30.0–36.0)
MCV: 90.4 fL (ref 80.0–100.0)
Monocytes Absolute: 0.7 10*3/uL (ref 0.1–1.0)
Monocytes Relative: 20 %
Neutro Abs: 1.3 10*3/uL — ABNORMAL LOW (ref 1.7–7.7)
Neutrophils Relative %: 36 %
Platelets: 228 10*3/uL (ref 150–400)
RBC: 4.46 MIL/uL (ref 4.22–5.81)
RDW: 19.2 % — ABNORMAL HIGH (ref 11.5–15.5)
Smear Review: NORMAL
WBC Morphology: INCREASED
WBC: 3.4 10*3/uL — ABNORMAL LOW (ref 4.0–10.5)
nRBC: 1.8 % — ABNORMAL HIGH (ref 0.0–0.2)

## 2020-11-03 LAB — GLUCOSE, CAPILLARY
Glucose-Capillary: 90 mg/dL (ref 70–99)
Glucose-Capillary: 88 mg/dL (ref 70–99)
Glucose-Capillary: 103 mg/dL — ABNORMAL HIGH (ref 70–99)

## 2020-11-03 LAB — MAGNESIUM: Magnesium: 1.7 mg/dL (ref 1.7–2.4)

## 2020-11-03 LAB — PHOSPHORUS: Phosphorus: 2.7 mg/dL (ref 2.5–4.6)

## 2020-11-03 MED ORDER — ADULT MULTIVITAMIN W/MINERALS CH: 1.0000 | ORAL_TABLET | Freq: Every day | ORAL | 1 refills | Status: DC

## 2020-11-03 MED ORDER — MAGNESIUM SULFATE 2 GM/50ML IV SOLN
2.0000 g | Freq: Once | INTRAVENOUS | Status: AC
  Administered 2020-11-03: 2 g via INTRAVENOUS
  Filled 2020-11-03: qty 50

## 2020-11-03 MED ORDER — DOXYCYCLINE HYCLATE 100 MG PO TABS: 100.0000 mg | ORAL_TABLET | Freq: Two times a day (BID) | ORAL | 0 refills | Status: AC

## 2020-11-03 MED ORDER — GUAIFENESIN-DM 100-10 MG/5ML PO SYRP
5.0000 mL | ORAL_SOLUTION | ORAL | Status: DC | PRN
  Administered 2020-11-03: 5 mL via ORAL
  Filled 2020-11-03: qty 5

## 2020-11-03 MED ORDER — NICOTINE 21 MG/24HR TD PT24: 21.0000 mg | MEDICATED_PATCH | Freq: Every day | TRANSDERMAL | 0 refills | Status: DC

## 2020-11-03 MED ORDER — TBO-FILGRASTIM 480 MCG/0.8ML ~~LOC~~ SOSY
480.0000 ug | PREFILLED_SYRINGE | Freq: Once | SUBCUTANEOUS | Status: AC
  Administered 2020-11-03: 480 ug via SUBCUTANEOUS
  Filled 2020-11-03 (×4): qty 0.8

## 2020-11-03 NOTE — Plan of Care (Signed)
  Problem: Education: Goal: Knowledge of General Education information will improve Description: Including pain rating scale, medication(s)/side effects and non-pharmacologic comfort measures 11/03/2020 1124 by Cristela Blue, RN Outcome: Progressing 11/03/2020 1124 by Cristela Blue, RN Outcome: Progressing   Problem: Health Behavior/Discharge Planning: Goal: Ability to manage health-related needs will improve 11/03/2020 1124 by Cristela Blue, RN Outcome: Progressing 11/03/2020 1124 by Cristela Blue, RN Outcome: Progressing   Problem: Clinical Measurements: Goal: Ability to maintain clinical measurements within normal limits will improve 11/03/2020 1124 by Cristela Blue, RN Outcome: Progressing 11/03/2020 1124 by Cristela Blue, RN Outcome: Progressing Goal: Will remain free from infection 11/03/2020 1124 by Cristela Blue, RN Outcome: Progressing 11/03/2020 1124 by Cristela Blue, RN Outcome: Progressing Goal: Diagnostic test results will improve 11/03/2020 1124 by Cristela Blue, RN Outcome: Progressing 11/03/2020 1124 by Cristela Blue, RN Outcome: Progressing Goal: Respiratory complications will improve 11/03/2020 1124 by Cristela Blue, RN Outcome: Progressing 11/03/2020 1124 by Cristela Blue, RN Outcome: Progressing Goal: Cardiovascular complication will be avoided 11/03/2020 1124 by Cristela Blue, RN Outcome: Progressing 11/03/2020 1124 by Cristela Blue, RN Outcome: Progressing   Problem: Activity: Goal: Risk for activity intolerance will decrease 11/03/2020 1124 by Cristela Blue, RN Outcome: Progressing 11/03/2020 1124 by Cristela Blue, RN Outcome: Progressing   Problem: Nutrition: Goal: Adequate nutrition will be maintained 11/03/2020 1124 by Cristela Blue, RN Outcome: Progressing 11/03/2020 1124 by Cristela Blue, RN Outcome: Progressing   Problem: Coping: Goal: Level of anxiety will decrease 11/03/2020 1124 by Cristela Blue, RN Outcome:  Progressing 11/03/2020 1124 by Cristela Blue, RN Outcome: Progressing   Problem: Elimination: Goal: Will not experience complications related to bowel motility 11/03/2020 1124 by Cristela Blue, RN Outcome: Progressing 11/03/2020 1124 by Cristela Blue, RN Outcome: Progressing Goal: Will not experience complications related to urinary retention 11/03/2020 1124 by Cristela Blue, RN Outcome: Progressing 11/03/2020 1124 by Cristela Blue, RN Outcome: Progressing   Problem: Pain Managment: Goal: General experience of comfort will improve 11/03/2020 1124 by Cristela Blue, RN Outcome: Progressing 11/03/2020 1124 by Cristela Blue, RN Outcome: Progressing   Problem: Safety: Goal: Ability to remain free from injury will improve 11/03/2020 1124 by Cristela Blue, RN Outcome: Progressing 11/03/2020 1124 by Cristela Blue, RN Outcome: Progressing   Problem: Skin Integrity: Goal: Risk for impaired skin integrity will decrease 11/03/2020 1124 by Cristela Blue, RN Outcome: Progressing 11/03/2020 1124 by Cristela Blue, RN Outcome: Progressing

## 2020-11-03 NOTE — Progress Notes (Signed)
Pt is A&O, VS stable, NSR on the monitor. IVx2 in place. No complaints of pain or discomfort. 2L [place]. had large BM that was loose and using urinal at bedside. Plan for D/C today

## 2020-11-03 NOTE — Consult Note (Signed)
PHARMACY CONSULT NOTE  Pharmacy Consult for Electrolyte Monitoring and Replacement   Recent Labs: Potassium (mmol/L)  Date Value  11/03/2020 4.3   Magnesium (mg/dL)  Date Value  11/03/2020 1.7   Calcium (mg/dL)  Date Value  11/03/2020 8.9   Albumin (g/dL)  Date Value  10/30/2020 3.1 (L)   Phosphorus (mg/dL)  Date Value  11/03/2020 2.7   Sodium (mmol/L)  Date Value  11/03/2020 135     Assessment: Patient is a 60 y/o M with medical history including Felty's syndrome, cavitary pneumonia, chronic respiratory failure, tobacco abuse, GIB / esophageal varices, history of splenectomy, RA who is admitted with acute respiratory failure, sepsis, drug intoxication, and possible seizure activity. Pharmacy consulted to assist with electrolyte monitoring and replacement as indicated.  Patient extubated 7/27  Scr: 0.41>0.4 K 3.6>4.3 Cl: 101>95 Ca 8.6>8.9 Phos: 1.4>2.7 Mg: 1.9>1.7  Goal of Therapy:  Electrolytes within normal limits  Plan:  Phos 1.4>2.7 (K 3.6>4.3) after 24 mmol IV Kphos yesterday; no repletion today WNL Mg 1.9>1.7 downtrending, provider ordered MgSO4 IV 2g x1.  --Follow-up electrolytes with AM labs tomorrow --Patient is a high re-feed risk per RD assessment  Lorna Dibble 11/03/2020 10:48 AM

## 2020-11-03 NOTE — Progress Notes (Signed)
This RN provided discharge instructions and teaching to the patient. The patient verbalized and demonstrated understanding of the provided instructions. All outstanding questions resolved. Bilateral arm PIVs removed. Both cannulas intact. Pt tolerated well. All belongings packed and in tow. Volunteer service to transport patient to private vehicle via wheelchair at time of departure.

## 2020-11-03 NOTE — Discharge Summary (Signed)
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   Specimen: Tracheal Aspirate; Respiratory  Result Value Ref Range Status   Specimen Description   Final    TRACHEAL ASPIRATE Performed at [facility], [address] [place], [place] [address]    Special Requests   Final    Immunocompromised Performed at [facility], [place], [place] [address]    Gram Stain   Final    MODERATE WBC PRESENT, PREDOMINANTLY MONONUCLEAR NO ORGANISMS SEEN    Culture   Final    NO GROWTH 2 DAYS Performed at [facility], [place] [address], [place], [place] [address]    Report Status 11/02/2020 FINAL  Final     Labs: BNP (last 3 results) Recent Labs    09/06/20 1319 09/17/20 1345  BNP 104.8* [id number]   Basic Metabolic Panel: Recent Labs  Lab 10/30/20 2048 10/31/20 0424 11/01/20 0516 11/02/20 0457 11/03/20 0556  NA 133* 136 140 142 135  K 3.9 3.5 3.7  3.6 4.3  CL 95* 99 103 101 95*  CO2 '22 28 29 '$ 34* 33*  GLUCOSE 117* 123* 80 116* 96  BUN '13 12 12 18 18  '$ CREATININE 0.73 0.55* 0.57* 0.41* 0.40*  CALCIUM 8.5* 8.2* 8.6* 8.6* 8.9  MG  --  2.0 1.9 1.9 1.7  PHOS  --  4.5 2.9 1.4* 2.7   Liver Function Tests: Recent Labs  Lab 10/30/20 2048  AST 39  ALT 21  ALKPHOS 240*  BILITOT 0.8  PROT 7.5  ALBUMIN 3.1*   No results for input(s): LIPASE, AMYLASE in the last 168 hours. Recent Labs  Lab 10/30/20 2135 11/01/20 0516  AMMONIA 51* 36*   CBC: Recent Labs  Lab 10/30/20 2048 10/31/20 0424 11/01/20 0516 11/02/20 0457 11/03/20 0556  WBC 17.7* 3.8* 2.6* 11.0* 3.4*  NEUTROABS 0.1*  --  1.6* 7.6 1.3*  HGB 13.6 12.2* 11.8* 12.2* 13.1  HCT 42.8 37.5* 36.9* 38.4* 40.3  MCV 94.3 91.5 92.7 91.6 90.4  PLT 279 270 216 233 228   Cardiac Enzymes: No results for input(s): CKTOTAL, CKMB, CKMBINDEX, TROPONINI in the last 168 hours. BNP: Invalid input(s): POCBNP CBG: Recent Labs  Lab 11/02/20 1224 11/02/20 1703 11/02/20 2002 11/03/20 0603 11/03/20 0820  GLUCAP 141* 110* 126* 90 88   D-Dimer No results for input(s): DDIMER in the last 72 hours. Hgb A1c No results for input(s): HGBA1C in the last 72 hours. Lipid Profile No results for input(s): CHOL, HDL, LDLCALC, TRIG, CHOLHDL, LDLDIRECT in the last 72 hours. Thyroid function studies No results for input(s): TSH, T4TOTAL, T3FREE, THYROIDAB in the last 72 hours.  Invalid input(s): FREET3 Anemia work up No results for input(s): VITAMINB12, FOLATE, FERRITIN, TIBC, IRON, RETICCTPCT in the last 72 hours. Urinalysis    Component Value Date/Time   COLORURINE YELLOW (A) 10/30/2020 2127   APPEARANCEUR HAZY (A) 10/30/2020 2127   LABSPEC 1.015 10/30/2020 2127   PHURINE 6.0 10/30/2020 2127   GLUCOSEU NEGATIVE 10/30/2020 2127   HGBUR LARGE (A) 10/30/2020 2127   BILIRUBINUR NEGATIVE 10/30/2020 2127   [place] 10/30/2020 2127   PROTEINUR 100 (A) 10/30/2020 2127  UROBILINOGEN 0.2 10/21/2012 1737   NITRITE NEGATIVE 10/30/2020 2127   LEUKOCYTESUR NEGATIVE 10/30/2020 2127   Sepsis Labs Invalid input(s): PROCALCITONIN,  WBC,  LACTICIDVEN Microbiology Recent Results (from the past 240 hour(s))  Blood culture (routine x 2)     Status: None (Preliminary result)   Collection Time: 10/30/20  9:35 PM   Specimen: BLOOD  Result Value Ref Range Status   Specimen Description BLOOD LEFT HAND  Final   Special Requests   Final    BOTTLES DRAWN AEROBIC AND ANAEROBIC Blood Culture adequate volume   Culture   Final    NO GROWTH 3 DAYS Performed at [facility], [address], [place], [place] [address]    Report Status PENDING  Incomplete  Blood culture (routine x 2)     Status: None (Preliminary result)   Collection Time: 10/30/20  9:36 PM   Specimen: BLOOD  Result Value Ref Range Status   Specimen Description BLOOD RIGHT ASSIST CONTROL  Final   Special Requests   Final    BOTTLES DRAWN AEROBIC AND ANAEROBIC Blood Culture adequate volume   Culture   Final    NO GROWTH 3 DAYS Performed at [facility], [address] [place], [place] [address]    Report Status PENDING  Incomplete  Resp Panel by RT-PCR (Flu A&B, Covid) Nasopharyngeal Swab     Status: None   Collection Time: 10/30/20  9:45 PM   Specimen: Nasopharyngeal Swab; Nasopharyngeal(NP) swabs in vial transport medium  Result Value Ref Range Status   SARS Coronavirus 2 by RT PCR NEGATIVE NEGATIVE Final    Comment: (NOTE) SARS-CoV-2 target nucleic acids are NOT DETECTED.  The SARS-CoV-2 RNA is generally detectable in upper respiratory specimens during the acute phase of infection. The lowest concentration of SARS-CoV-2 viral copies this assay can detect is 138 copies/mL. A negative result does not preclude SARS-Cov-2 infection and should not be used as the sole basis for treatment or other patient management decisions. A negative result may occur with  improper specimen  collection/handling, submission of specimen other than nasopharyngeal swab, presence of viral mutation(s) within the areas targeted by this assay, and inadequate number of viral copies(<138 copies/mL). A negative result must be combined with clinical observations, patient history, and epidemiological information. The expected result is Negative.  Fact Sheet for Patients:  EntrepreneurPulse.com.au  Fact Sheet for Healthcare Providers:  IncredibleEmployment.be  This test is no t yet approved or cleared by the [place] FDA and  has been authorized for detection and/or diagnosis of SARS-CoV-2 by FDA under an Emergency Use Authorization (EUA). This EUA will remain  in effect (meaning this test can be used) for the duration of the COVID-19 declaration under Section 564(b)(1) of the Act, 21 U.S.C.section 360bbb-3(b)(1), unless the authorization is terminated  or revoked sooner.       Influenza A by PCR NEGATIVE NEGATIVE Final   Influenza B by PCR NEGATIVE NEGATIVE Final    Comment: (NOTE) The Xpert Xpress SARS-CoV-2/FLU/RSV plus assay is intended as an aid in the diagnosis of influenza from Nasopharyngeal swab specimens and should not be used as a sole basis for treatment. Nasal washings and aspirates are unacceptable for Xpert Xpress SARS-CoV-2/FLU/RSV testing.  Fact Sheet for Patients: EntrepreneurPulse.com.au  Fact Sheet for Healthcare Providers: IncredibleEmployment.be  This test is not yet approved or cleared by the [place] FDA and has been authorized for detection and/or diagnosis of SARS-CoV-2 by FDA under an Emergency Use Authorization (EUA). This EUA will  remain in effect (meaning this test can be used) for the duration of the COVID-19 declaration under Section 564(b)(1) of the Act, 21 U.S.C. section 360bbb-3(b)(1), unless the authorization is terminated or revoked.  Performed at [facility], [address] [place], [place] [address]   MRSA Next Gen by PCR, Nasal     Status: None   Collection Time: 10/31/20  1:44 AM   Specimen: Nasal Mucosa; Nasal Swab  Result Value Ref Range Status   MRSA by PCR Next Gen NOT DETECTED NOT DETECTED Final    Comment: (NOTE) The GeneXpert MRSA Assay (FDA approved for NASAL specimens only), is one component of a comprehensive MRSA colonization surveillance program. It is not intended to diagnose MRSA infection nor to guide or monitor treatment for MRSA infections. Test performance is not FDA approved in patients less than 28 years old. Performed at [facility], [address] [place], [place] [address]   Culture, Respiratory w Gram Stain     Status: None   Collection Time: 10/31/20 11:36 AM   Specimen: Tracheal Aspirate; Respiratory  Result Value Ref Range Status   Specimen Description   Final    TRACHEAL ASPIRATE Performed at [facility], [address] [place], [place] [address]    Special Requests   Final    Immunocompromised Performed at [facility], [place], [place] [address]    Gram Stain   Final    MODERATE WBC PRESENT, PREDOMINANTLY MONONUCLEAR NO ORGANISMS SEEN    Culture   Final    NO GROWTH 2 DAYS Performed at [facility], [place] [address] [place], [place] [address]    Report Status 11/02/2020 FINAL  Final    Time coordinating discharge: Over 30 minutes  SIGNED:  Lorella Nimrod, MD  [facility] 11/03/2020, 11:01 AM  If 7PM-7AM, please contact night-coverage [email or web address]  This record has been created using Systems analyst. Errors have been sought and corrected,but may not always be located. Such creation errors do not reflect on the standard of care.

## 2020-11-04 LAB — GLUCOSE, CAPILLARY: Glucose-Capillary: 40 mg/dL — CL (ref 70–99)

## 2020-11-06 LAB — COMP PANEL: LEUKEMIA/LYMPHOMA

## 2020-11-06 LAB — CULTURE, BLOOD (ROUTINE X 2)
Culture: NO GROWTH
Culture: NO GROWTH
Special Requests: ADEQUATE
Special Requests: ADEQUATE

## 2020-12-02 ENCOUNTER — Emergency Department: Payer: Medicaid Other

## 2020-12-02 ENCOUNTER — Other Ambulatory Visit: Payer: Self-pay

## 2020-12-02 ENCOUNTER — Inpatient Hospital Stay
Admission: EM | Admit: 2020-12-02 | Discharge: 2020-12-19 | DRG: 853 | Disposition: A | Discharge status: Other Acute Inpatient Hospital | Payer: Medicaid Other | Attending: Internal Medicine | Admitting: Internal Medicine

## 2020-12-02 DIAGNOSIS — I714 Abdominal aortic aneurysm, without rupture, unspecified: Secondary | ICD-10-CM

## 2020-12-02 DIAGNOSIS — R109 Unspecified abdominal pain: Secondary | ICD-10-CM

## 2020-12-02 DIAGNOSIS — E871 Hypo-osmolality and hyponatremia: Secondary | ICD-10-CM | POA: Diagnosis present

## 2020-12-02 DIAGNOSIS — M549 Dorsalgia, unspecified: Secondary | ICD-10-CM

## 2020-12-02 DIAGNOSIS — K8010 Calculus of gallbladder with chronic cholecystitis without obstruction: Secondary | ICD-10-CM | POA: Diagnosis present

## 2020-12-02 DIAGNOSIS — D8481 Immunodeficiency due to conditions classified elsewhere: Secondary | ICD-10-CM | POA: Diagnosis present

## 2020-12-02 DIAGNOSIS — I10 Essential (primary) hypertension: Secondary | ICD-10-CM | POA: Diagnosis present

## 2020-12-02 DIAGNOSIS — J449 Chronic obstructive pulmonary disease, unspecified: Secondary | ICD-10-CM | POA: Diagnosis present

## 2020-12-02 DIAGNOSIS — E878 Other disorders of electrolyte and fluid balance, not elsewhere classified: Secondary | ICD-10-CM | POA: Diagnosis present

## 2020-12-02 DIAGNOSIS — K651 Peritoneal abscess: Secondary | ICD-10-CM | POA: Diagnosis present

## 2020-12-02 DIAGNOSIS — K660 Peritoneal adhesions (postprocedural) (postinfection): Secondary | ICD-10-CM | POA: Diagnosis present

## 2020-12-02 DIAGNOSIS — J9610 Chronic respiratory failure, unspecified whether with hypoxia or hypercapnia: Secondary | ICD-10-CM | POA: Diagnosis present

## 2020-12-02 DIAGNOSIS — Z9081 Acquired absence of spleen: Secondary | ICD-10-CM

## 2020-12-02 DIAGNOSIS — R0600 Dyspnea, unspecified: Secondary | ICD-10-CM

## 2020-12-02 DIAGNOSIS — T80818A Extravasation of other vesicant agent, initial encounter: Secondary | ICD-10-CM | POA: Diagnosis not present

## 2020-12-02 DIAGNOSIS — Z881 Allergy status to other antibiotic agents status: Secondary | ICD-10-CM

## 2020-12-02 DIAGNOSIS — K432 Incisional hernia without obstruction or gangrene: Secondary | ICD-10-CM | POA: Diagnosis present

## 2020-12-02 DIAGNOSIS — M0500 Felty's syndrome, unspecified site: Secondary | ICD-10-CM | POA: Diagnosis present

## 2020-12-02 DIAGNOSIS — D709 Neutropenia, unspecified: Secondary | ICD-10-CM

## 2020-12-02 DIAGNOSIS — Z79899 Other long term (current) drug therapy: Secondary | ICD-10-CM

## 2020-12-02 DIAGNOSIS — A419 Sepsis, unspecified organism: Secondary | ICD-10-CM

## 2020-12-02 DIAGNOSIS — Z8701 Personal history of pneumonia (recurrent): Secondary | ICD-10-CM

## 2020-12-02 DIAGNOSIS — Z4659 Encounter for fitting and adjustment of other gastrointestinal appliance and device: Secondary | ICD-10-CM

## 2020-12-02 DIAGNOSIS — Z20822 Contact with and (suspected) exposure to covid-19: Secondary | ICD-10-CM | POA: Diagnosis present

## 2020-12-02 DIAGNOSIS — A4159 Other Gram-negative sepsis: Principal | ICD-10-CM | POA: Diagnosis present

## 2020-12-02 DIAGNOSIS — E876 Hypokalemia: Secondary | ICD-10-CM | POA: Diagnosis not present

## 2020-12-02 DIAGNOSIS — D696 Thrombocytopenia, unspecified: Secondary | ICD-10-CM

## 2020-12-02 DIAGNOSIS — I700 Atherosclerosis of aorta: Secondary | ICD-10-CM | POA: Diagnosis present

## 2020-12-02 DIAGNOSIS — Z825 Family history of asthma and other chronic lower respiratory diseases: Secondary | ICD-10-CM

## 2020-12-02 DIAGNOSIS — D61818 Other pancytopenia: Secondary | ICD-10-CM | POA: Diagnosis present

## 2020-12-02 DIAGNOSIS — Z9981 Dependence on supplemental oxygen: Secondary | ICD-10-CM

## 2020-12-02 DIAGNOSIS — D65 Disseminated intravascular coagulation [defibrination syndrome]: Secondary | ICD-10-CM | POA: Diagnosis not present

## 2020-12-02 DIAGNOSIS — I4510 Unspecified right bundle-branch block: Secondary | ICD-10-CM | POA: Diagnosis present

## 2020-12-02 DIAGNOSIS — Z8614 Personal history of Methicillin resistant Staphylococcus aureus infection: Secondary | ICD-10-CM

## 2020-12-02 DIAGNOSIS — K219 Gastro-esophageal reflux disease without esophagitis: Secondary | ICD-10-CM | POA: Diagnosis present

## 2020-12-02 DIAGNOSIS — R748 Abnormal levels of other serum enzymes: Secondary | ICD-10-CM | POA: Diagnosis not present

## 2020-12-02 DIAGNOSIS — F17210 Nicotine dependence, cigarettes, uncomplicated: Secondary | ICD-10-CM | POA: Diagnosis present

## 2020-12-02 DIAGNOSIS — Z888 Allergy status to other drugs, medicaments and biological substances status: Secondary | ICD-10-CM

## 2020-12-02 DIAGNOSIS — R7401 Elevation of levels of liver transaminase levels: Secondary | ICD-10-CM | POA: Diagnosis not present

## 2020-12-02 DIAGNOSIS — Z7952 Long term (current) use of systemic steroids: Secondary | ICD-10-CM

## 2020-12-02 DIAGNOSIS — C91Z0 Other lymphoid leukemia not having achieved remission: Secondary | ICD-10-CM | POA: Diagnosis present

## 2020-12-02 DIAGNOSIS — K8020 Calculus of gallbladder without cholecystitis without obstruction: Secondary | ICD-10-CM

## 2020-12-02 DIAGNOSIS — K256 Chronic or unspecified gastric ulcer with both hemorrhage and perforation: Secondary | ICD-10-CM | POA: Diagnosis present

## 2020-12-02 DIAGNOSIS — K828 Other specified diseases of gallbladder: Secondary | ICD-10-CM | POA: Diagnosis present

## 2020-12-02 DIAGNOSIS — Z8249 Family history of ischemic heart disease and other diseases of the circulatory system: Secondary | ICD-10-CM

## 2020-12-02 DIAGNOSIS — K255 Chronic or unspecified gastric ulcer with perforation: Secondary | ICD-10-CM

## 2020-12-02 DIAGNOSIS — K631 Perforation of intestine (nontraumatic): Secondary | ICD-10-CM

## 2020-12-02 DIAGNOSIS — E875 Hyperkalemia: Secondary | ICD-10-CM | POA: Diagnosis not present

## 2020-12-02 DIAGNOSIS — Z681 Body mass index (BMI) 19 or less, adult: Secondary | ICD-10-CM

## 2020-12-02 LAB — CBC
HCT: 43.1 % (ref 39.0–52.0)
Hemoglobin: 14.5 g/dL (ref 13.0–17.0)
MCH: 28.6 pg (ref 26.0–34.0)
MCHC: 33.6 g/dL (ref 30.0–36.0)
MCV: 85 fL (ref 80.0–100.0)
Platelets: 163 10*3/uL (ref 150–400)
RBC: 5.07 MIL/uL (ref 4.22–5.81)
RDW: 18.4 % — ABNORMAL HIGH (ref 11.5–15.5)
WBC: 1.7 10*3/uL — ABNORMAL LOW (ref 4.0–10.5)
nRBC: 1.2 % — ABNORMAL HIGH (ref 0.0–0.2)

## 2020-12-02 LAB — BASIC METABOLIC PANEL
Anion gap: 9 (ref 5–15)
BUN: 22 mg/dL — ABNORMAL HIGH (ref 6–20)
CO2: 25 mmol/L (ref 22–32)
Calcium: 8.1 mg/dL — ABNORMAL LOW (ref 8.9–10.3)
Chloride: 93 mmol/L — ABNORMAL LOW (ref 98–111)
Creatinine, Ser: 0.78 mg/dL (ref 0.61–1.24)
GFR, Estimated: >60 mL/min (ref >60)
Glucose, Bld: 90 mg/dL (ref 70–99)
Potassium: 3.7 mmol/L (ref 3.5–5.1)
Sodium: 127 mmol/L — ABNORMAL LOW (ref 135–145)

## 2020-12-02 LAB — TROPONIN I (HIGH SENSITIVITY): Troponin I (High Sensitivity): 22 ng/L — ABNORMAL HIGH (ref <18)

## 2020-12-02 LAB — LACTIC ACID, PLASMA: Lactic Acid, Venous: 1.9 mmol/L (ref 0.5–1.9)

## 2020-12-02 IMAGING — CR DG CHEST 2V
1 series · 2 of 2 positions shown · non-contrast
Comparison: [DATE]

CLINICAL DATA: sob

EXAM:
CHEST - 2 VIEW

[Series 1: dg chest 2 view · 0.14mm/px · 2 of 2 slices shown]
[im 1/2]
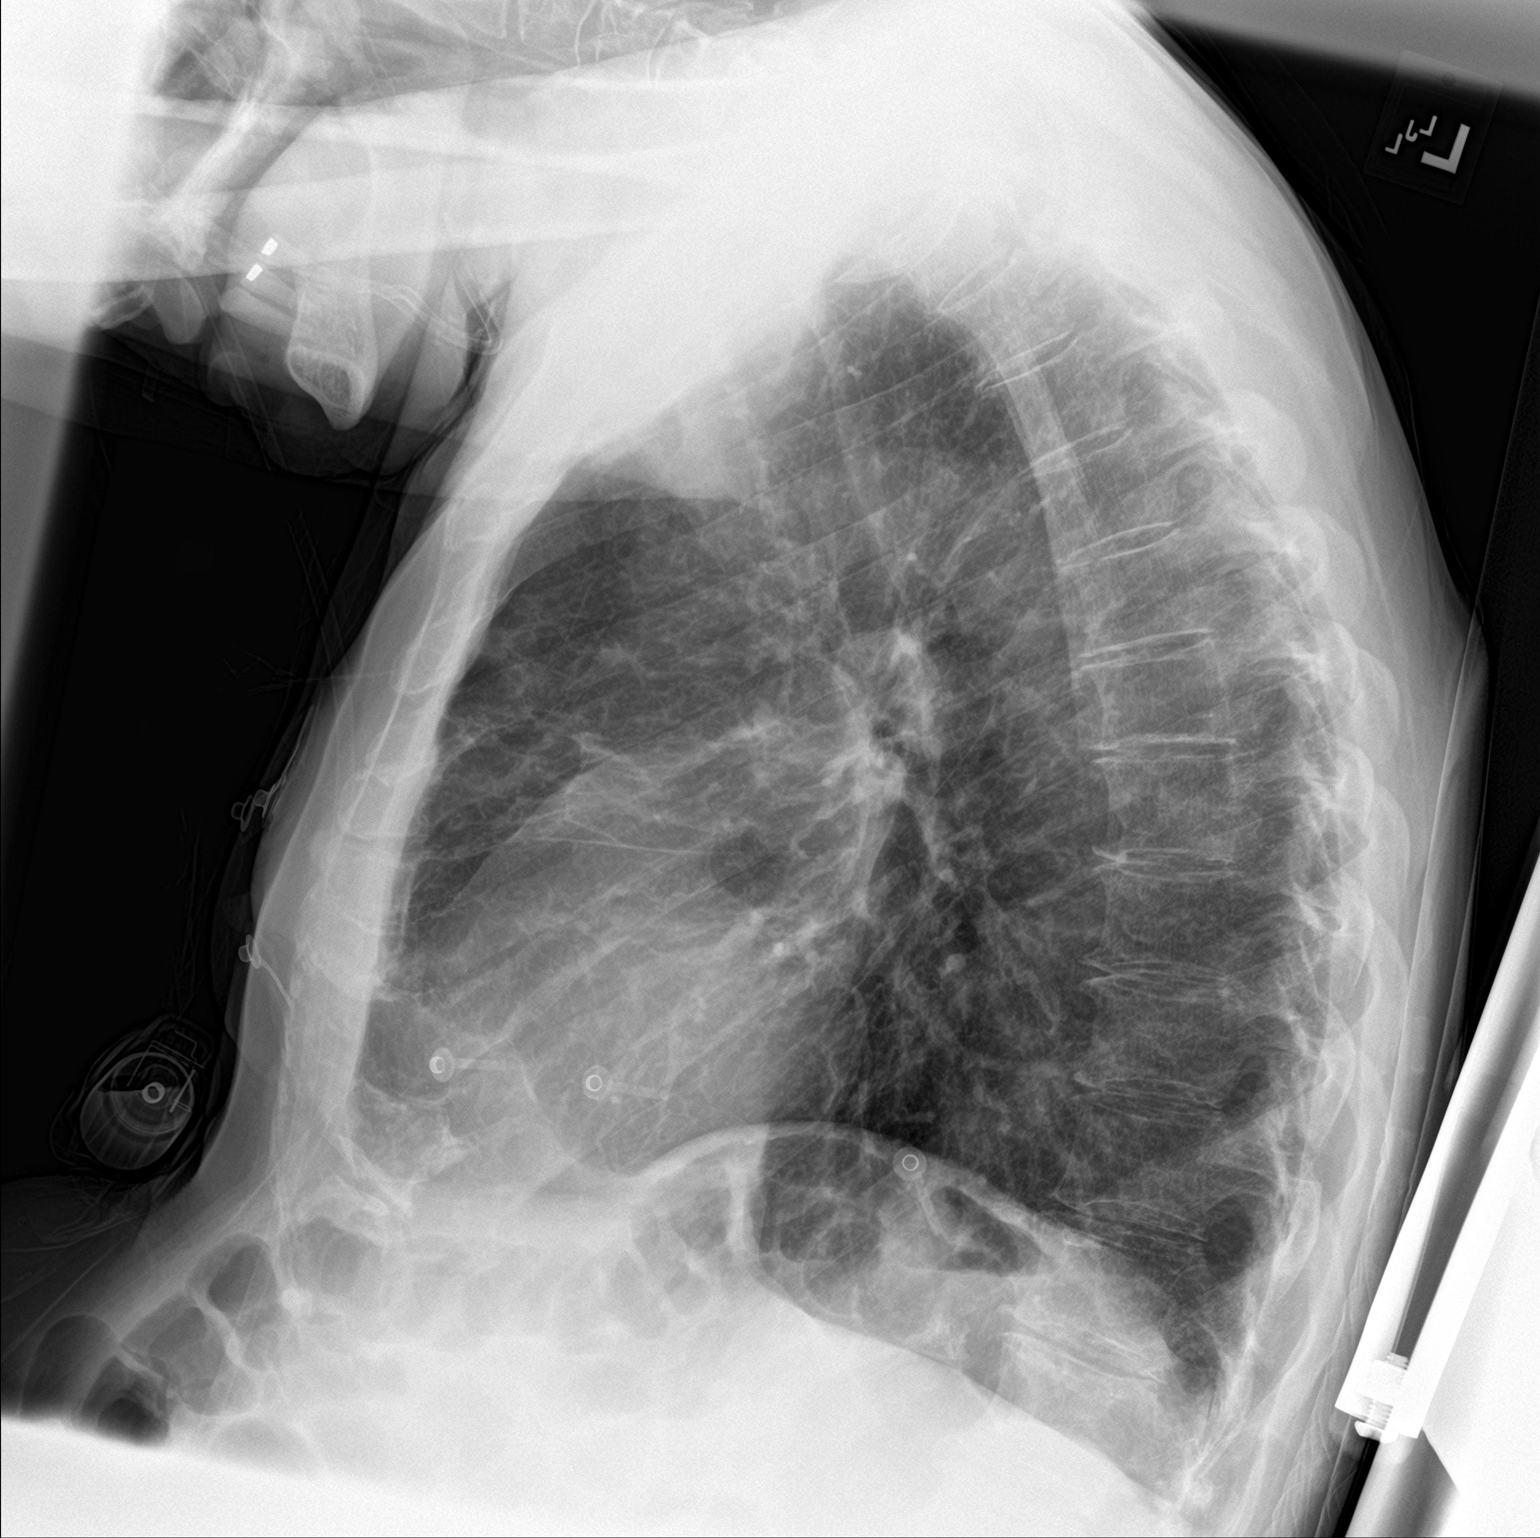
[im 2/2]
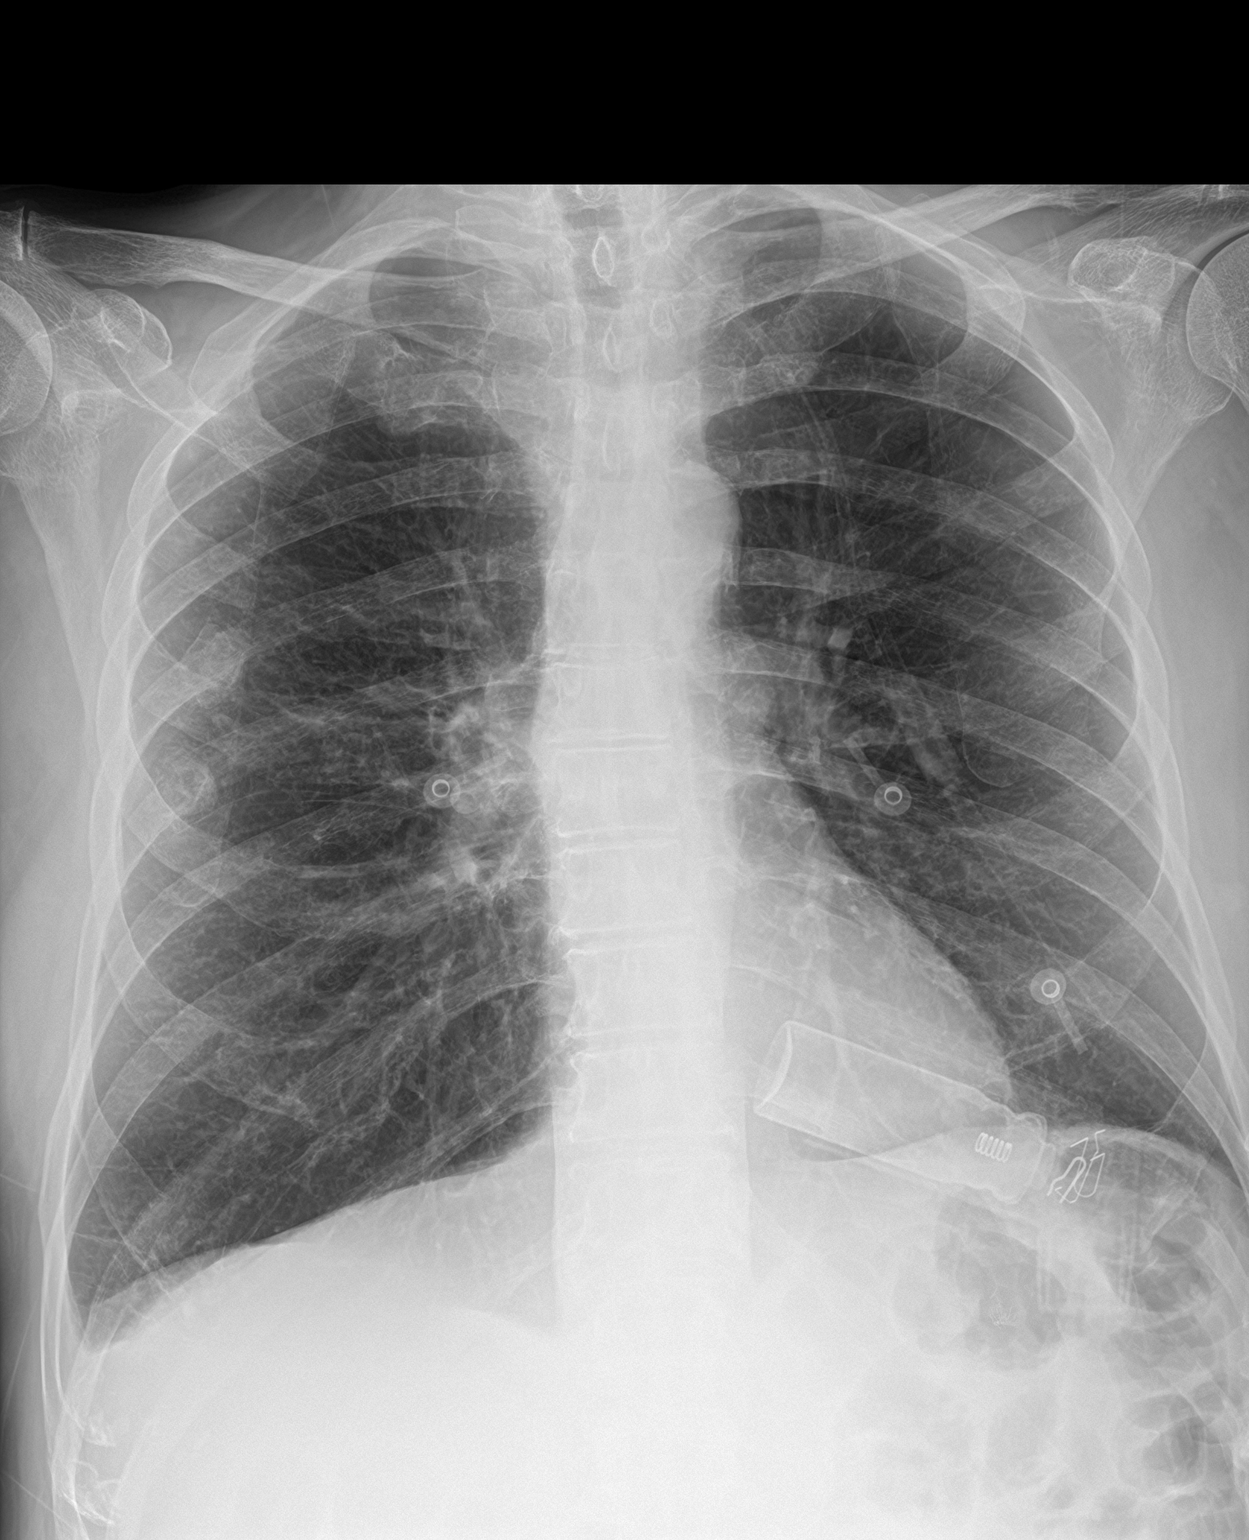

[2 of 2 positions shown; findings below may reference images not displayed]

FINDINGS: The cardiomediastinal silhouette is unchanged in
contour.Emphysematous changes. No pleural effusion. No pneumothorax.
Cavitating region described on prior CT is not well visualized
radiographically. There is a likely small residual cavitary lesion
in the RIGHT lateral chest visualized abdomen is unremarkable.
Multilevel degenerative changes of the thoracic spine.
IMPRESSION: Previously seen cavitary lesion on prior CT is not well visualized
on today's radiograph. There is a small likely residual cavitary
region in the RIGHT lateral chest.

## 2020-12-02 NOTE — ED Triage Notes (Signed)
See first nurse note. Pt reports "can't breathe" and it has been going on for a month. Pt unsure why he is being brought in today. Cursing under his breath. States he hurts "right in the gut."

## 2020-12-02 NOTE — ED Triage Notes (Signed)
Pt in via [name] from home with c/o failure to thrive. Per family, pt was admitted to hospital a month ago for pneumonia and still has it. Pt is not ambulatory and has not eaten in several days. 100.8 temp. Pt c/o pain to right side from a fall 2 weeks ago. Pt with hernia to abd as well that hurts when he cough.

## 2020-12-03 ENCOUNTER — Emergency Department: Payer: Medicaid Other

## 2020-12-03 DIAGNOSIS — K432 Incisional hernia without obstruction or gangrene: Secondary | ICD-10-CM | POA: Diagnosis not present

## 2020-12-03 DIAGNOSIS — C9480 Other specified leukemias not having achieved remission: Secondary | ICD-10-CM

## 2020-12-03 DIAGNOSIS — K255 Chronic or unspecified gastric ulcer with perforation: Secondary | ICD-10-CM | POA: Diagnosis not present

## 2020-12-03 DIAGNOSIS — M0500 Felty's syndrome, unspecified site: Secondary | ICD-10-CM | POA: Diagnosis present

## 2020-12-03 DIAGNOSIS — D61818 Other pancytopenia: Secondary | ICD-10-CM | POA: Diagnosis present

## 2020-12-03 DIAGNOSIS — A419 Sepsis, unspecified organism: Secondary | ICD-10-CM

## 2020-12-03 DIAGNOSIS — K651 Peritoneal abscess: Secondary | ICD-10-CM | POA: Diagnosis present

## 2020-12-03 DIAGNOSIS — T80818A Extravasation of other vesicant agent, initial encounter: Secondary | ICD-10-CM | POA: Diagnosis not present

## 2020-12-03 DIAGNOSIS — D8481 Immunodeficiency due to conditions classified elsewhere: Secondary | ICD-10-CM | POA: Diagnosis present

## 2020-12-03 DIAGNOSIS — I714 Abdominal aortic aneurysm, without rupture: Secondary | ICD-10-CM | POA: Diagnosis present

## 2020-12-03 DIAGNOSIS — E876 Hypokalemia: Secondary | ICD-10-CM | POA: Diagnosis not present

## 2020-12-03 DIAGNOSIS — D65 Disseminated intravascular coagulation [defibrination syndrome]: Secondary | ICD-10-CM | POA: Diagnosis not present

## 2020-12-03 DIAGNOSIS — K8010 Calculus of gallbladder with chronic cholecystitis without obstruction: Secondary | ICD-10-CM | POA: Diagnosis present

## 2020-12-03 DIAGNOSIS — M549 Dorsalgia, unspecified: Secondary | ICD-10-CM | POA: Diagnosis present

## 2020-12-03 DIAGNOSIS — I10 Essential (primary) hypertension: Secondary | ICD-10-CM | POA: Diagnosis present

## 2020-12-03 DIAGNOSIS — Z681 Body mass index (BMI) 19 or less, adult: Secondary | ICD-10-CM | POA: Diagnosis not present

## 2020-12-03 DIAGNOSIS — Z20822 Contact with and (suspected) exposure to covid-19: Secondary | ICD-10-CM | POA: Diagnosis present

## 2020-12-03 DIAGNOSIS — F17210 Nicotine dependence, cigarettes, uncomplicated: Secondary | ICD-10-CM | POA: Diagnosis present

## 2020-12-03 DIAGNOSIS — J449 Chronic obstructive pulmonary disease, unspecified: Secondary | ICD-10-CM

## 2020-12-03 DIAGNOSIS — K256 Chronic or unspecified gastric ulcer with both hemorrhage and perforation: Secondary | ICD-10-CM | POA: Diagnosis present

## 2020-12-03 DIAGNOSIS — I700 Atherosclerosis of aorta: Secondary | ICD-10-CM | POA: Diagnosis present

## 2020-12-03 DIAGNOSIS — R198 Other specified symptoms and signs involving the digestive system and abdomen: Secondary | ICD-10-CM

## 2020-12-03 DIAGNOSIS — E878 Other disorders of electrolyte and fluid balance, not elsewhere classified: Secondary | ICD-10-CM | POA: Diagnosis present

## 2020-12-03 DIAGNOSIS — C91Z0 Other lymphoid leukemia not having achieved remission: Secondary | ICD-10-CM | POA: Diagnosis present

## 2020-12-03 DIAGNOSIS — Z9081 Acquired absence of spleen: Secondary | ICD-10-CM

## 2020-12-03 DIAGNOSIS — E875 Hyperkalemia: Secondary | ICD-10-CM | POA: Diagnosis not present

## 2020-12-03 DIAGNOSIS — A4159 Other Gram-negative sepsis: Secondary | ICD-10-CM | POA: Diagnosis present

## 2020-12-03 DIAGNOSIS — D709 Neutropenia, unspecified: Secondary | ICD-10-CM

## 2020-12-03 DIAGNOSIS — D696 Thrombocytopenia, unspecified: Secondary | ICD-10-CM | POA: Diagnosis not present

## 2020-12-03 DIAGNOSIS — I4510 Unspecified right bundle-branch block: Secondary | ICD-10-CM | POA: Diagnosis present

## 2020-12-03 DIAGNOSIS — E871 Hypo-osmolality and hyponatremia: Secondary | ICD-10-CM | POA: Diagnosis present

## 2020-12-03 DIAGNOSIS — J9610 Chronic respiratory failure, unspecified whether with hypoxia or hypercapnia: Secondary | ICD-10-CM | POA: Diagnosis present

## 2020-12-03 DIAGNOSIS — K8020 Calculus of gallbladder without cholecystitis without obstruction: Secondary | ICD-10-CM | POA: Diagnosis not present

## 2020-12-03 DIAGNOSIS — K219 Gastro-esophageal reflux disease without esophagitis: Secondary | ICD-10-CM | POA: Diagnosis not present

## 2020-12-03 DIAGNOSIS — R5081 Fever presenting with conditions classified elsewhere: Secondary | ICD-10-CM | POA: Diagnosis not present

## 2020-12-03 LAB — MAGNESIUM: Magnesium: 1.9 mg/dL (ref 1.7–2.4)

## 2020-12-03 LAB — BASIC METABOLIC PANEL WITH GFR
Anion gap: 7 (ref 5–15)
BUN: 20 mg/dL (ref 6–20)
CO2: 24 mmol/L (ref 22–32)
Calcium: 7.3 mg/dL — ABNORMAL LOW (ref 8.9–10.3)
Chloride: 97 mmol/L — ABNORMAL LOW (ref 98–111)
Creatinine, Ser: 0.59 mg/dL — ABNORMAL LOW (ref 0.61–1.24)
GFR, Estimated: >60 mL/min
Glucose, Bld: 79 mg/dL (ref 70–99)
Potassium: 3 mmol/L — ABNORMAL LOW (ref 3.5–5.1)
Sodium: 128 mmol/L — ABNORMAL LOW (ref 135–145)

## 2020-12-03 LAB — CBC
HCT: 36.2 % — ABNORMAL LOW (ref 39.0–52.0)
Hemoglobin: 11.9 g/dL — ABNORMAL LOW (ref 13.0–17.0)
MCH: 27.4 pg (ref 26.0–34.0)
MCHC: 32.9 g/dL (ref 30.0–36.0)
MCV: 83.4 fL (ref 80.0–100.0)
Platelets: 181 K/uL (ref 150–400)
RBC: 4.34 MIL/uL (ref 4.22–5.81)
RDW: 18.2 % — ABNORMAL HIGH (ref 11.5–15.5)
WBC: 1 K/uL — CL (ref 4.0–10.5)
nRBC: 1.9 % — ABNORMAL HIGH (ref 0.0–0.2)

## 2020-12-03 LAB — CBC WITH DIFFERENTIAL/PLATELET
Abs Immature Granulocytes: 0.01 10*3/uL (ref 0.00–0.07)
Basophils Absolute: 0 10*3/uL (ref 0.0–0.1)
Basophils Relative: 1 %
Eosinophils Absolute: 0 10*3/uL (ref 0.0–0.5)
Eosinophils Relative: 1 %
HCT: 35.7 % — ABNORMAL LOW (ref 39.0–52.0)
Hemoglobin: 11.9 g/dL — ABNORMAL LOW (ref 13.0–17.0)
Immature Granulocytes: 1 %
Lymphocytes Relative: 67 %
Lymphs Abs: 0.7 10*3/uL (ref 0.7–4.0)
MCH: 27.7 pg (ref 26.0–34.0)
MCHC: 33.3 g/dL (ref 30.0–36.0)
MCV: 83.2 fL (ref 80.0–100.0)
Monocytes Absolute: 0.2 10*3/uL (ref 0.1–1.0)
Monocytes Relative: 14 %
Neutro Abs: 0.2 10*3/uL — CL (ref 1.7–7.7)
Neutrophils Relative %: 16 %
Platelets: 158 10*3/uL (ref 150–400)
RBC: 4.29 MIL/uL (ref 4.22–5.81)
RDW: 18.4 % — ABNORMAL HIGH (ref 11.5–15.5)
Smear Review: NORMAL
WBC: 1.1 10*3/uL — CL (ref 4.0–10.5)
nRBC: 3.8 % — ABNORMAL HIGH (ref 0.0–0.2)

## 2020-12-03 LAB — LACTIC ACID, PLASMA
Lactic Acid, Venous: 0.9 mmol/L (ref 0.5–1.9)
Lactic Acid, Venous: 1.3 mmol/L (ref 0.5–1.9)
Lactic Acid, Venous: 2.6 mmol/L (ref 0.5–1.9)

## 2020-12-03 LAB — URINALYSIS, COMPLETE (UACMP) WITH MICROSCOPIC
Bacteria, UA: NONE SEEN
Bilirubin Urine: NEGATIVE
Glucose, UA: NEGATIVE mg/dL
Ketones, ur: NEGATIVE mg/dL
Leukocytes,Ua: NEGATIVE
Nitrite: NEGATIVE
Protein, ur: NEGATIVE mg/dL
Specific Gravity, Urine: >1.046 — ABNORMAL HIGH (ref 1.005–1.030)
Squamous Epithelial / HPF: NONE SEEN (ref 0–5)
pH: 5 (ref 5.0–8.0)

## 2020-12-03 LAB — RESP PANEL BY RT-PCR (FLU A&B, COVID) ARPGX2
Influenza A by PCR: NEGATIVE
Influenza B by PCR: NEGATIVE
SARS Coronavirus 2 by RT PCR: NEGATIVE

## 2020-12-03 LAB — HEPATIC FUNCTION PANEL
ALT: 11 U/L (ref 0–44)
AST: 51 U/L — ABNORMAL HIGH (ref 15–41)
Albumin: 2.6 g/dL — ABNORMAL LOW (ref 3.5–5.0)
Alkaline Phosphatase: 381 U/L — ABNORMAL HIGH (ref 38–126)
Bilirubin, Direct: 0.5 mg/dL — ABNORMAL HIGH (ref 0.0–0.2)
Indirect Bilirubin: 0.7 mg/dL (ref 0.3–0.9)
Total Bilirubin: 1.2 mg/dL (ref 0.3–1.2)
Total Protein: 6.5 g/dL (ref 6.5–8.1)

## 2020-12-03 LAB — APTT: aPTT: 37 s — ABNORMAL HIGH (ref 24–36)

## 2020-12-03 LAB — URINE DRUG SCREEN, QUALITATIVE (ARMC ONLY)
Amphetamines, Ur Screen: NOT DETECTED
Barbiturates, Ur Screen: NOT DETECTED
Benzodiazepine, Ur Scrn: NOT DETECTED
Cannabinoid 50 Ng, Ur ~~LOC~~: NOT DETECTED
Cocaine Metabolite,Ur ~~LOC~~: NOT DETECTED
MDMA (Ecstasy)Ur Screen: NOT DETECTED
Methadone Scn, Ur: NOT DETECTED
Opiate, Ur Screen: NOT DETECTED
Phencyclidine (PCP) Ur S: NOT DETECTED
Tricyclic, Ur Screen: NOT DETECTED

## 2020-12-03 LAB — PROTIME-INR
INR: 1.1 (ref 0.8–1.2)
Prothrombin Time: 14.3 s (ref 11.4–15.2)

## 2020-12-03 LAB — TROPONIN I (HIGH SENSITIVITY): Troponin I (High Sensitivity): 22 ng/L — ABNORMAL HIGH (ref <18)

## 2020-12-03 LAB — PROCALCITONIN: Procalcitonin: 0.37 ng/mL

## 2020-12-03 IMAGING — CT CT T SPINE W/O CM
3 of 5 series · 12 of 33 positions shown, 14 images · non-contrast
Comparison: CT of the chest abdomen pelvis dated [DATE]. Chest
CT dated [DATE] and CT of the abdomen pelvis dated [DATE].

CLINICAL DATA: Fall and pain.

EXAM:
CT THORACIC AND LUMBAR SPINE WITHOUT CONTRAST
TECHNIQUE: Multidetector CT imaging of the thoracic and lumbar spine was
performed without contrast. Multiplanar CT image reconstructions
were also generated.

[Series 9: axial t-spine and l-spine st · axial · 0.39mm/px · z∈[-1054,-512]mm · 6 of 354 slices shown, 8 images]
[im 55/354  soft-tissue]
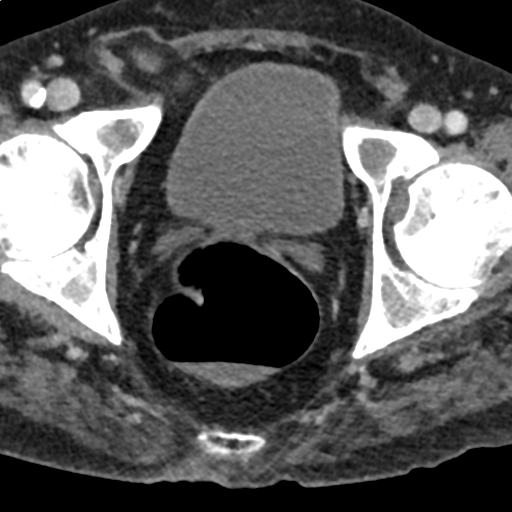
[im 55/354  bone]
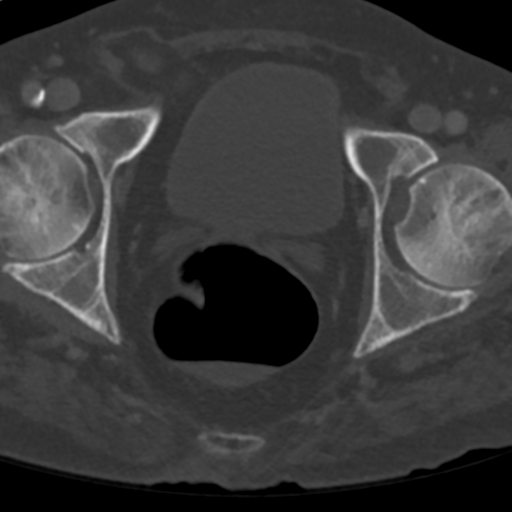
[im 109/354  bone]
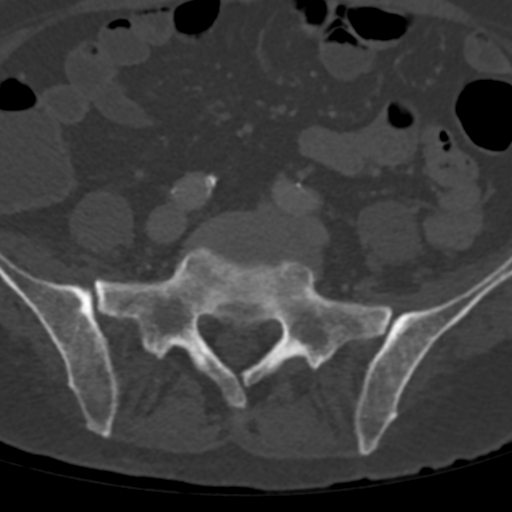
[im 163/354  bone]
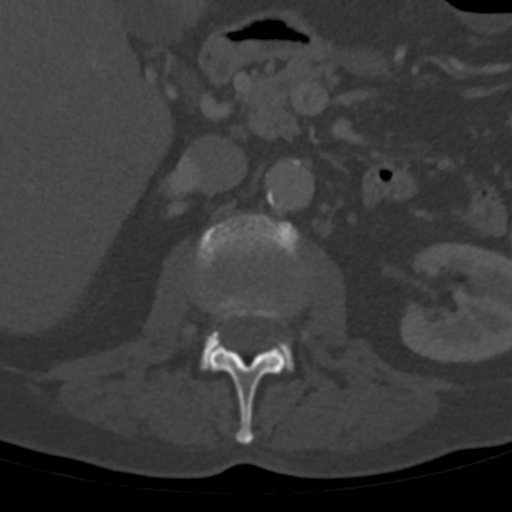
[im 218/354  bone]
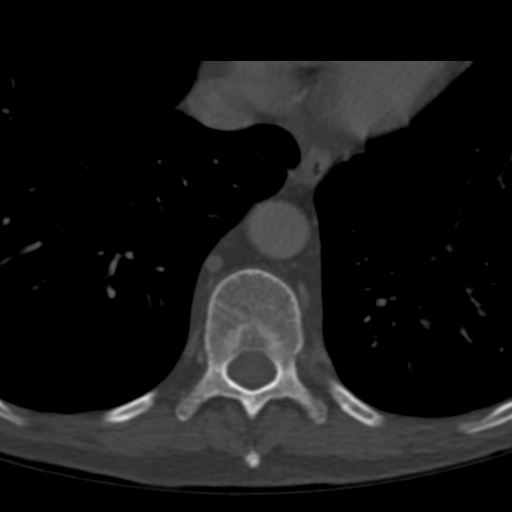
[im 272/354  soft-tissue]
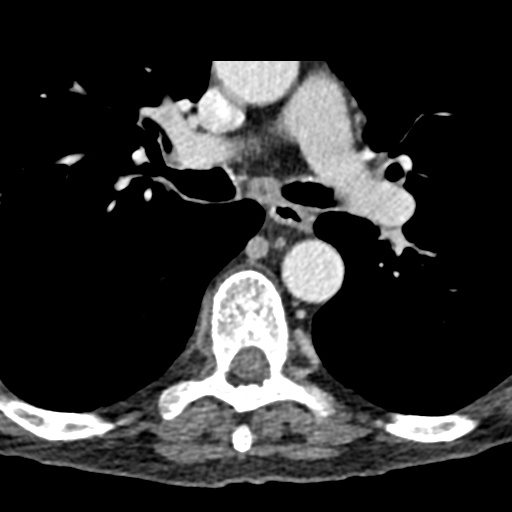
[im 272/354  bone]
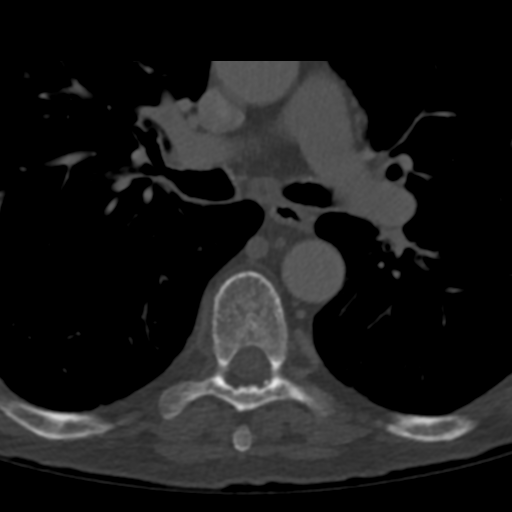
[im 326/354  bone]
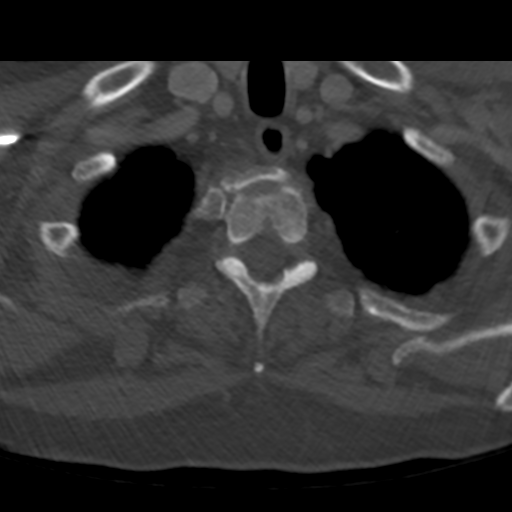

[Series 12: coronal t-spine st · coronal · 0.33mm/px · 1 of 1417 slices shown]
[im 709/1417  bone]
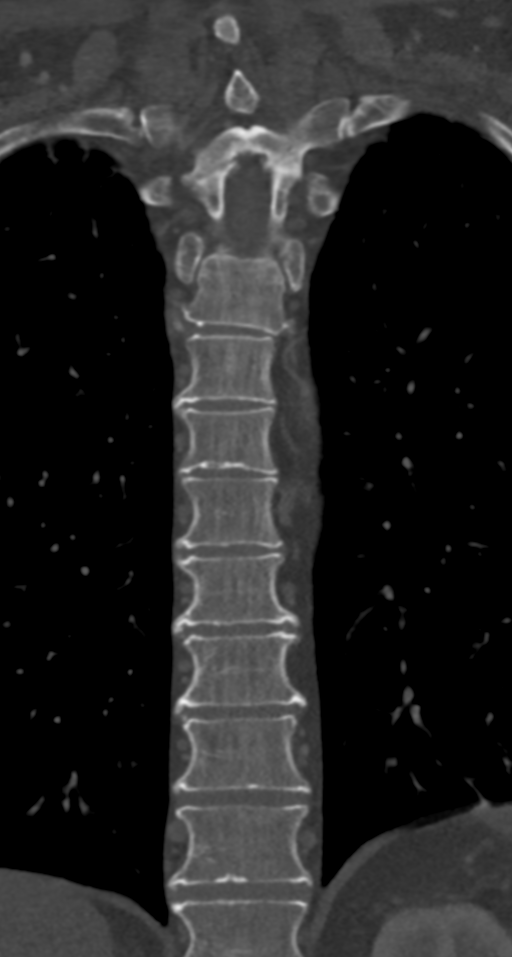

[Series 13: sagittal t-spine st · sagittal · 0.32mm/px · 5 of 1413 slices shown]
[im 236/1413  bone]
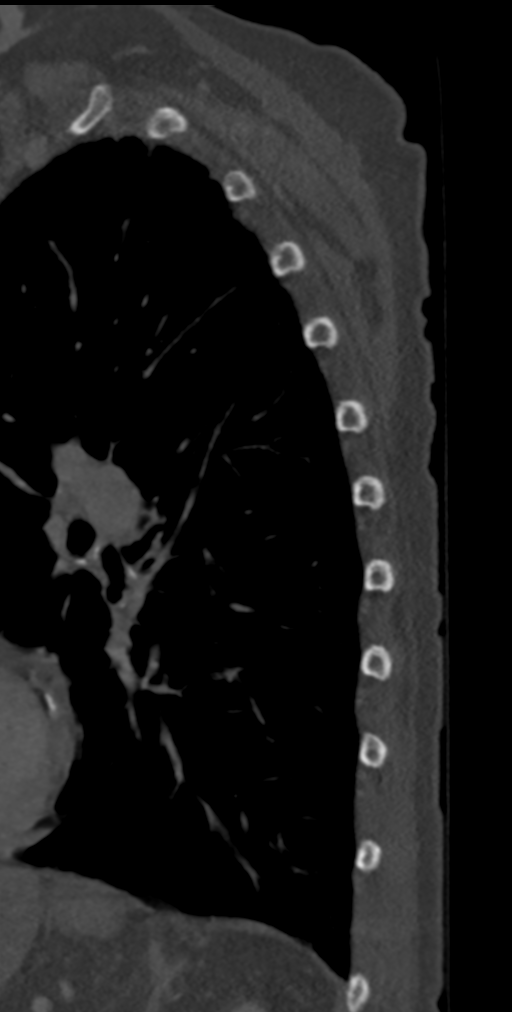
[im 471/1413  bone]
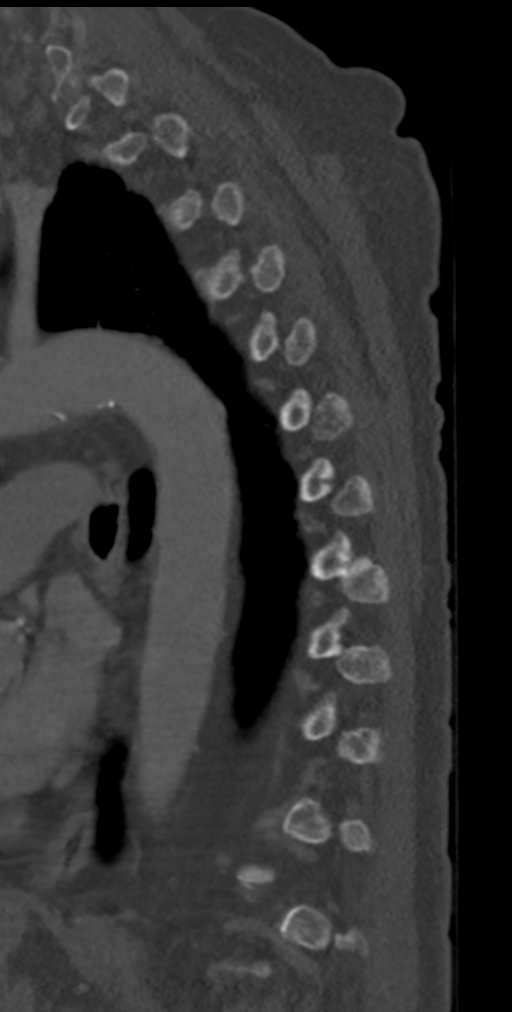
[im 707/1413  bone]
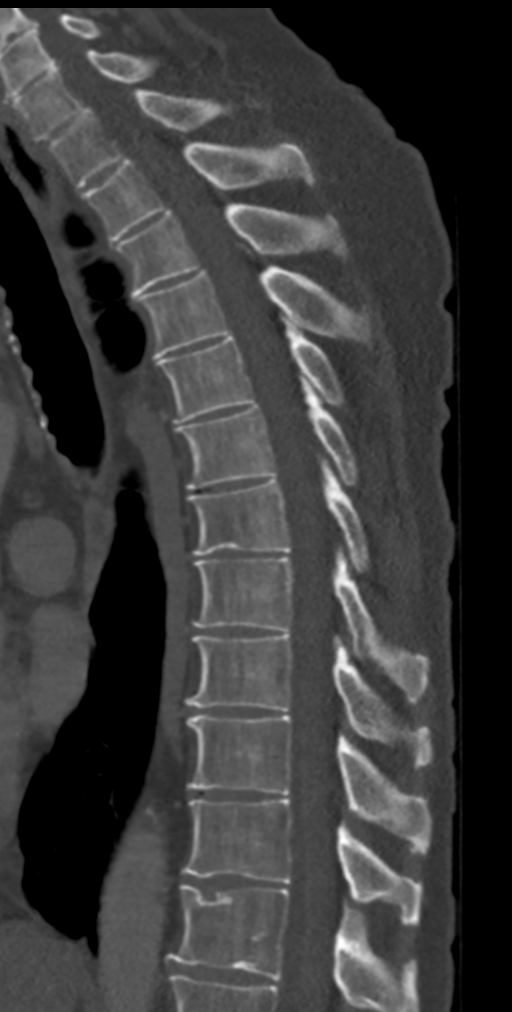
[im 942/1413  bone]
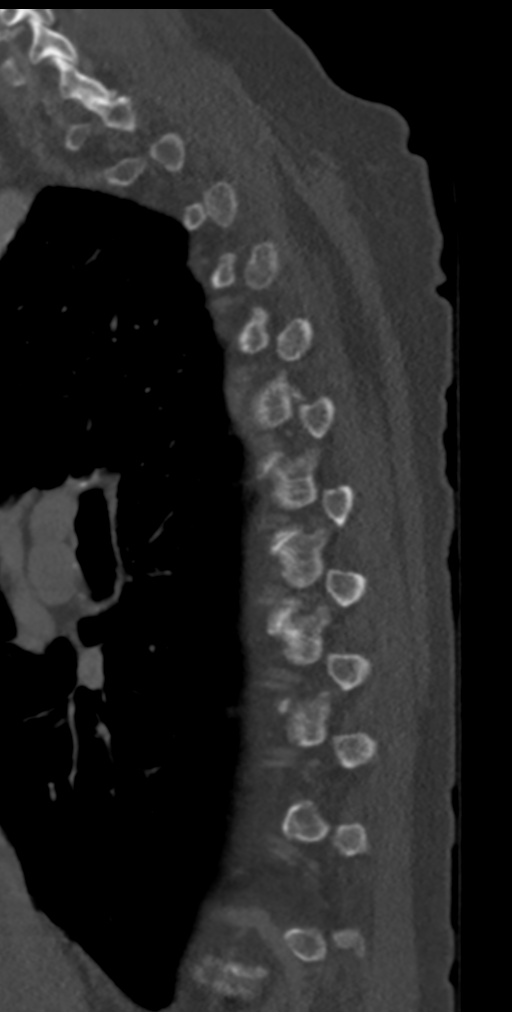
[im 1177/1413  bone]
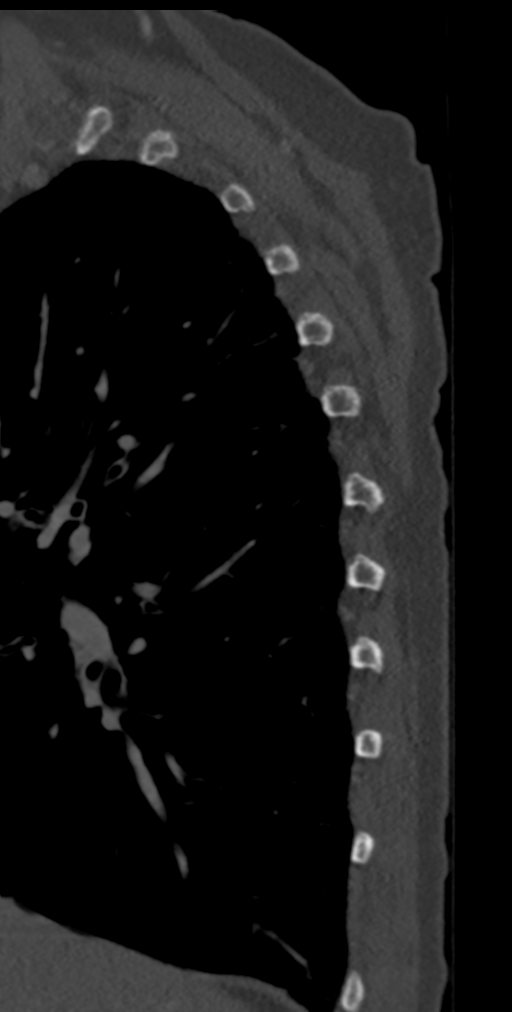

[12 of 33 positions shown; findings below may reference images not displayed]

FINDINGS: CT THORACIC SPINE FINDINGS

Alignment: Normal.

Vertebrae: No acute fracture or focal pathologic process.

Paraspinal and other soft tissues: Negative.

Disc levels: No acute findings.  Mild degenerative changes.

CT LUMBAR SPINE FINDINGS

Segmentation: 5 lumbar type vertebrae.

Alignment: Normal.

Vertebrae: No acute fracture or focal pathologic process.

Paraspinal and other soft tissues: Negative.

Disc levels: No acute findings. No significant degenerative changes.
IMPRESSION: No acute/traumatic thoracic or lumbar spine pathology.

## 2020-12-03 IMAGING — CT CT CHEST-ABD-PELV W/ CM
2 of 5 series · 12 of 36 positions shown, 14 images · IV contrast (omnipaque)
Comparison: Thoracic and lumbar spine CT dated [DATE].

CLINICAL DATA: Right-sided abdominal pain after fall. Persistent
cough and shortness of breath.

EXAM:
CT CHEST, ABDOMEN, AND PELVIS WITH CONTRAST
TECHNIQUE: Multidetector CT imaging of the chest, abdomen and pelvis was
performed following the standard protocol during bolus
administration of intravenous contrast.
CONTRAST:  80mL OMNIPAQUE IOHEXOL 350 MG/ML SOLN

[Series 2: cap with · axial · 0.72mm/px · z∈[-1092,-532]mm · 9 of 142 slices shown, 11 images]
[im 15/142  mediastinal]
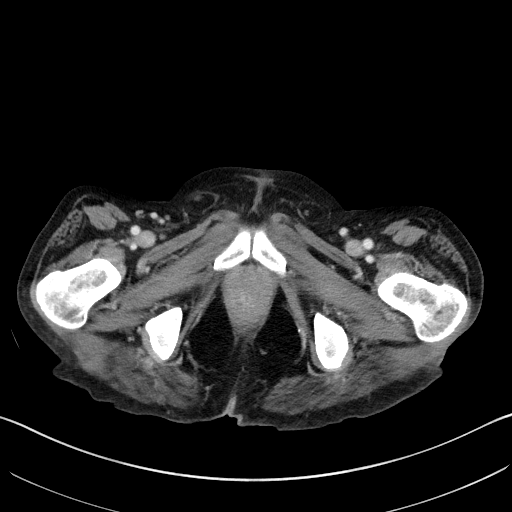
[im 15/142  bone]
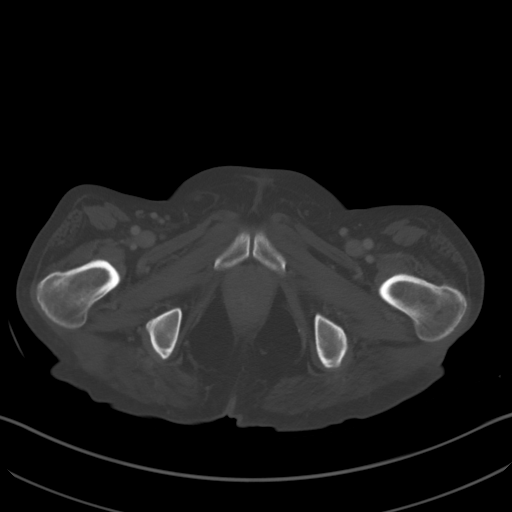
[im 29/142  mediastinal]
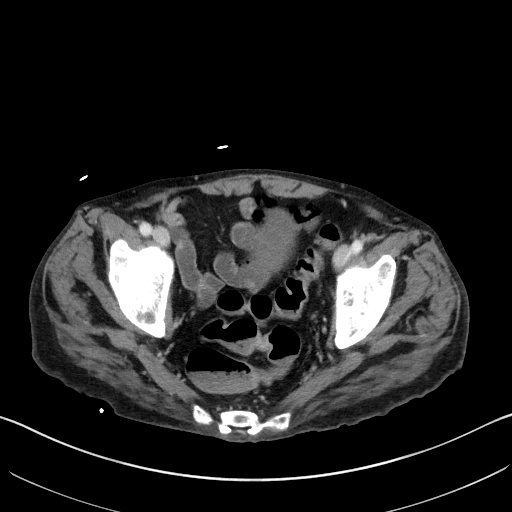
[im 43/142  mediastinal]
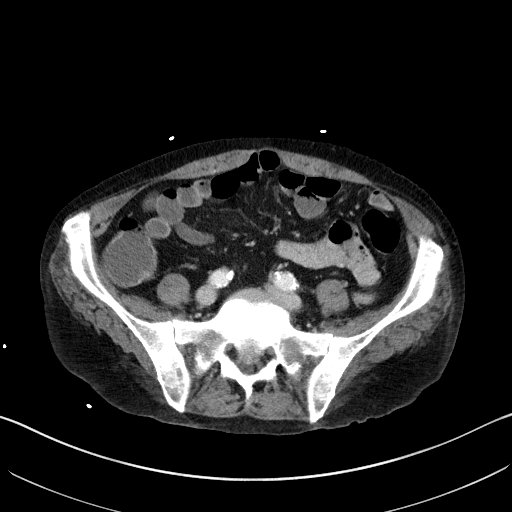
[im 57/142  mediastinal]
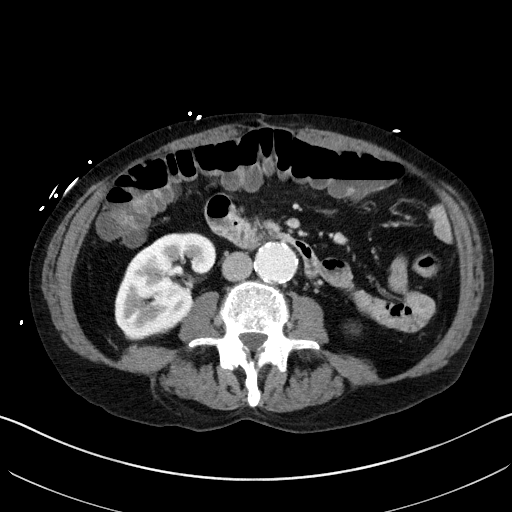
[im 71/142  mediastinal]
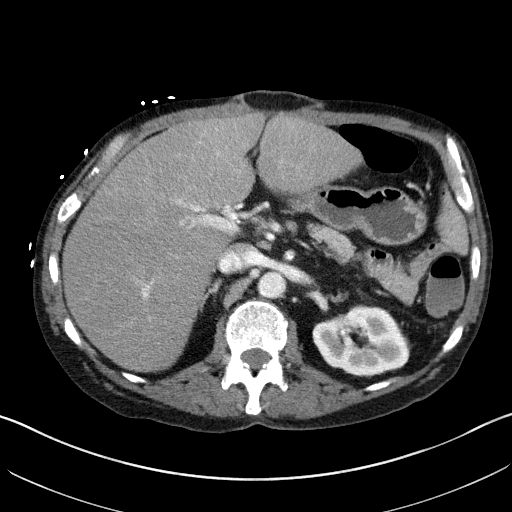
[im 85/142  mediastinal]
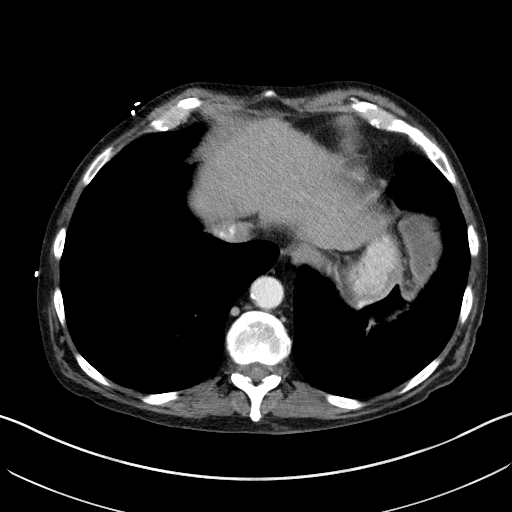
[im 99/142  mediastinal]
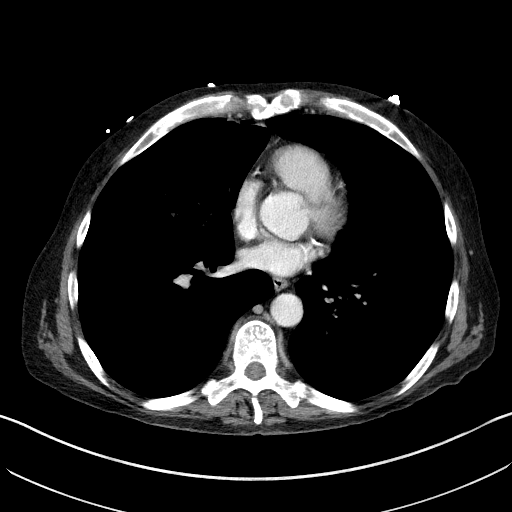
[im 113/142  mediastinal]
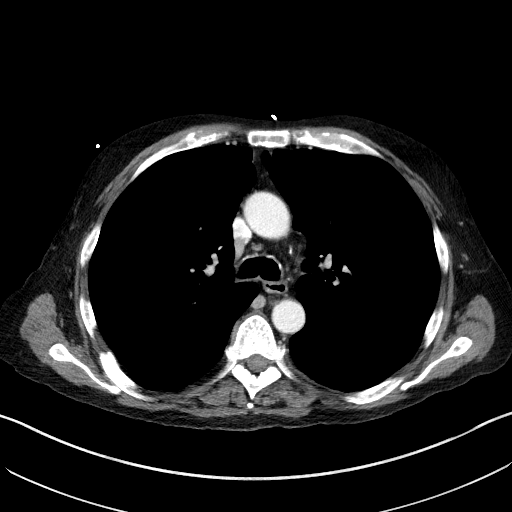
[im 127/142  mediastinal]
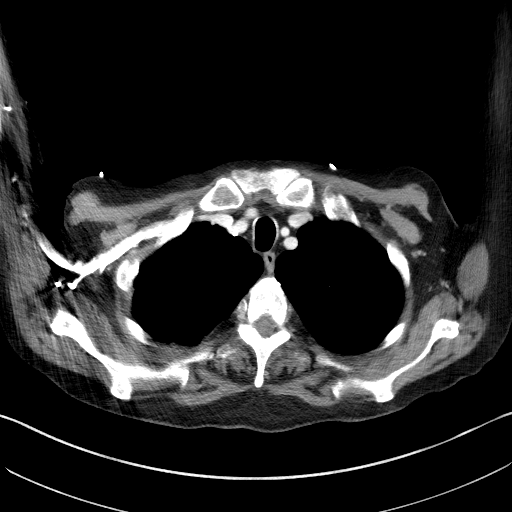
[im 127/142  bone]
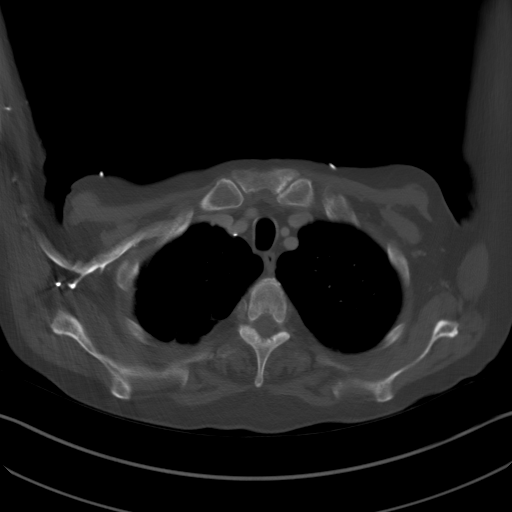

[Series 4: coronals · coronal · 0.73mm/px · 3 of 142 slices shown]
[im 29/142  mediastinal]
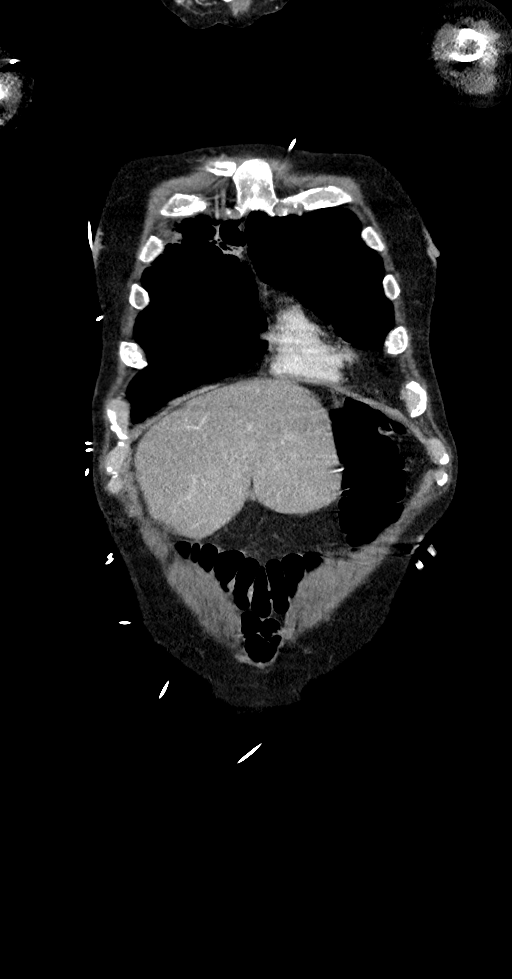
[im 57/142  mediastinal]
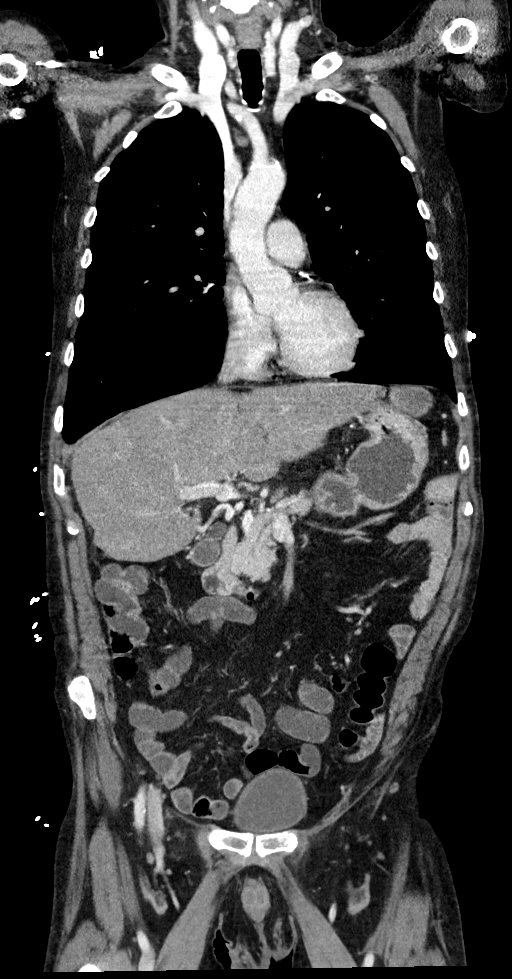
[im 85/142  mediastinal]
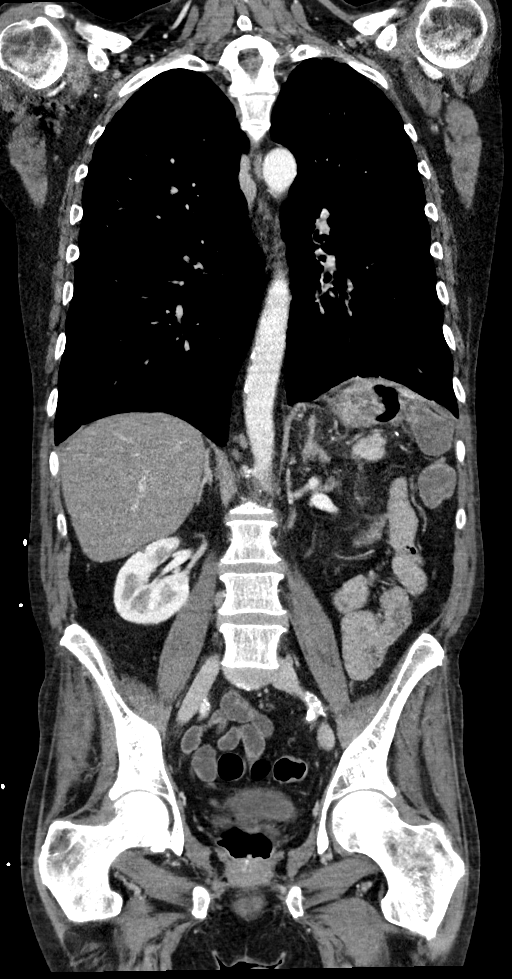

[12 of 36 positions shown; findings below may reference images not displayed]

FINDINGS: CT CHEST FINDINGS

Cardiovascular: There is no cardiomegaly or pericardial effusion.
Three-vessel coronary vascular calcification. Mild atherosclerotic
calcification of the thoracic aorta. No aneurysmal dilatation or
dissection. The origins of the great vessels of the aortic arch
appear patent as visualized. The central pulmonary arteries appear
unremarkable.

Mediastinum/Nodes: No hilar or mediastinal adenopathy. The esophagus
is grossly unremarkable. No mediastinal fluid collection.

Lungs/Pleura: Background of centrilobular emphysema. An area of
scarring and cystic changes in the right middle lobe anteriorly
again noted. There has been interval improvement of the
consolidative area associated with this cystic changes compared to
prior CT. The cluster of apparent nodular density at the left lung
base likely chronic or vascular/interstitial crowding (139/3). No
consolidative changes. There is no pleural effusion or pneumothorax.
A 5 mm nodular density in the anterior and medial aspect of the left
upper lobe (80/3), appears similar to prior CT, likely an area of
scarring. The central airways are patent.

Musculoskeletal: Several old right posterior rib fractures. No acute
osseous pathology.

CT ABDOMEN PELVIS FINDINGS

No intra-abdominal free air or free fluid.

Hepatobiliary: Fatty liver. No intrahepatic biliary ductal
dilatation. Small gallstone. No pericholecystic fluid or evidence of
acute cholecystitis by CT.

Pancreas: Calcification or surgical material at the tip of the tail
of the pancreas. The pancreas is otherwise unremarkable.

Spleen: Splenectomy.

Adrenals/Urinary Tract: The adrenal glands unremarkable. There is no
hydronephrosis on either side. There is symmetric enhancement and
excretion of contrast by both kidneys. The visualized ureters and
urinary bladder appear unremarkable.

Stomach/Bowel: There is a 3.7 x 4.1 cm collection containing air and
fluid in the left upper abdomen under the left hemidiaphragm. This
appears to communicate with the stomach. This corresponds to the
previously seen surgical clips involving the stomach and most
concerning for sutural dehiscence and extraluminal collection or
developing abscess. Clinical correlation and surgical consult is
advised.

There is no bowel obstruction. There is loose stool throughout the
colon compatible with diarrheal state. The appendix is normal. There
is a surgical clip in the right lower quadrant adjacent to the
appendix, likely a displaced surgical clip of the stomach.

Vascular/Lymphatic: Moderate aortoiliac atherosclerotic disease.
There is a 3 cm fusiform infrarenal abdominal aortic aneurysm. The
IVC is unremarkable. No portal venous gas. There is no adenopathy.

Reproductive: The prostate and seminal vesicles are grossly
unremarkable. No pelvic mass

Other: Midline vertical anterior abdominal wall incisional scar.
Small fat containing supraumbilical ventral hernia.

Musculoskeletal: No acute or significant osseous findings.
IMPRESSION: 1. A 3.7 x 4.1 cm collection containing air and fluid in the left
upper abdomen contiguous with the gastric fundus and corresponding
to the previously seen surgical clips. Findings most concerning for
sutural dehiscence and extraluminal collection or developing
abscess. Clinical correlation and surgical consult is advised.
2. Diarrheal state. No bowel obstruction. Normal appendix.
3. A 3 cm fusiform infrarenal abdominal aortic aneurysm. Recommend
follow-up ultrasound every 3 years. This recommendation follows ACR
consensus guidelines: White Paper of the ACR Incidental Findings
Committee II on Vascular Findings. [HOSPITAL] [QZ];
4. Aortic Atherosclerosis ([QZ]-[QZ]) and Emphysema ([QZ]-[QZ]).

## 2020-12-03 IMAGING — CT CT L SPINE W/O CM
3 series · 12 of 33 positions shown, 14 images · non-contrast
Comparison: CT of the chest abdomen pelvis dated [DATE]. Chest
CT dated [DATE] and CT of the abdomen pelvis dated [DATE].

CLINICAL DATA: Fall and pain.

EXAM:
CT THORACIC AND LUMBAR SPINE WITHOUT CONTRAST
TECHNIQUE: Multidetector CT imaging of the thoracic and lumbar spine was
performed without contrast. Multiplanar CT image reconstructions
were also generated.

[Series 9: axial t-spine and l-spine st · axial · 0.39mm/px · z∈[-1054,-566]mm · 4 of 354 slices shown, 5 images]
[im 55/354  soft-tissue]
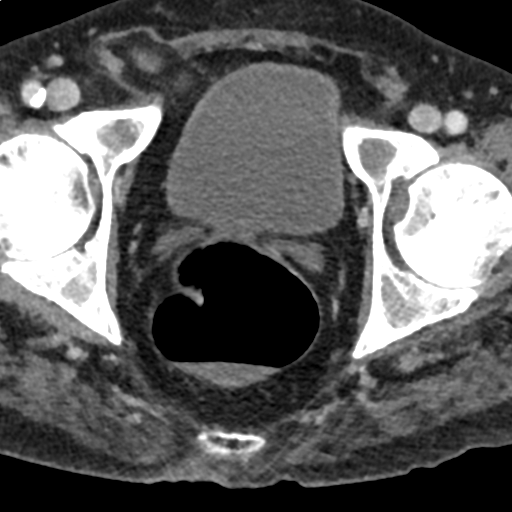
[im 55/354  bone]
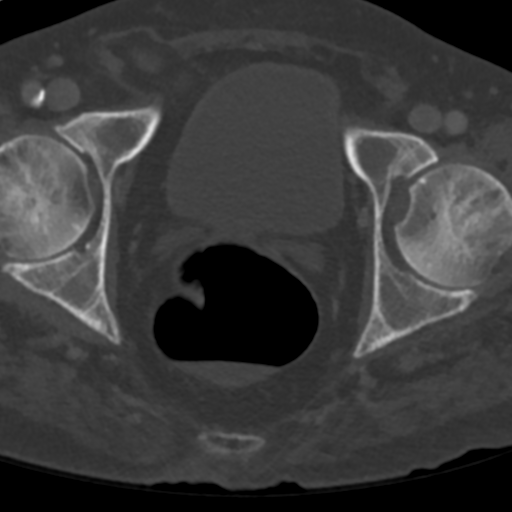
[im 136/354  bone]
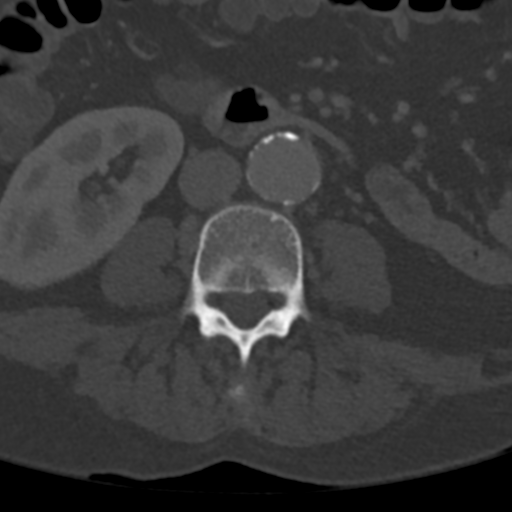
[im 218/354  bone]
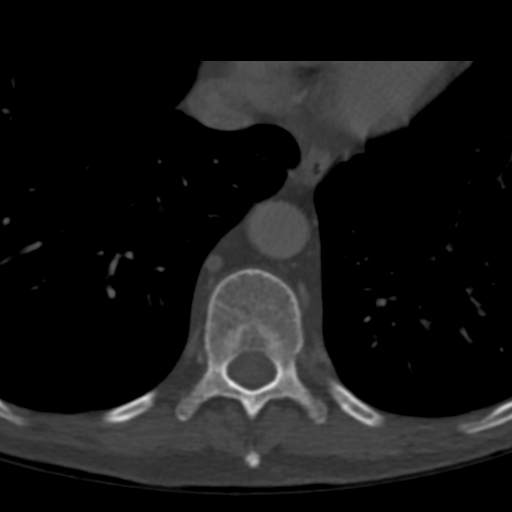
[im 299/354  bone]
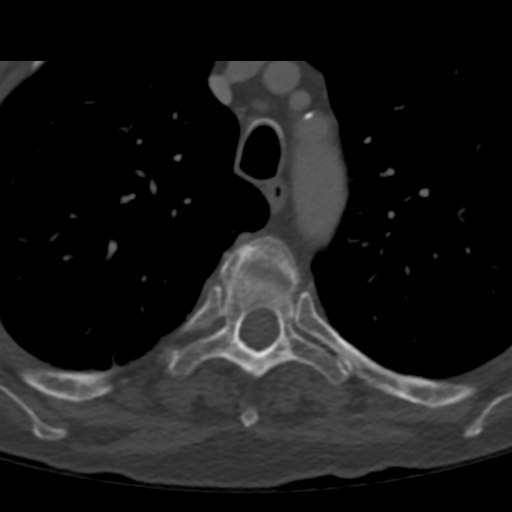

[Series 14: coronal l-spine bone · coronal · 0.33mm/px · 3 of 1417 slices shown]
[im 284/1417  bone]
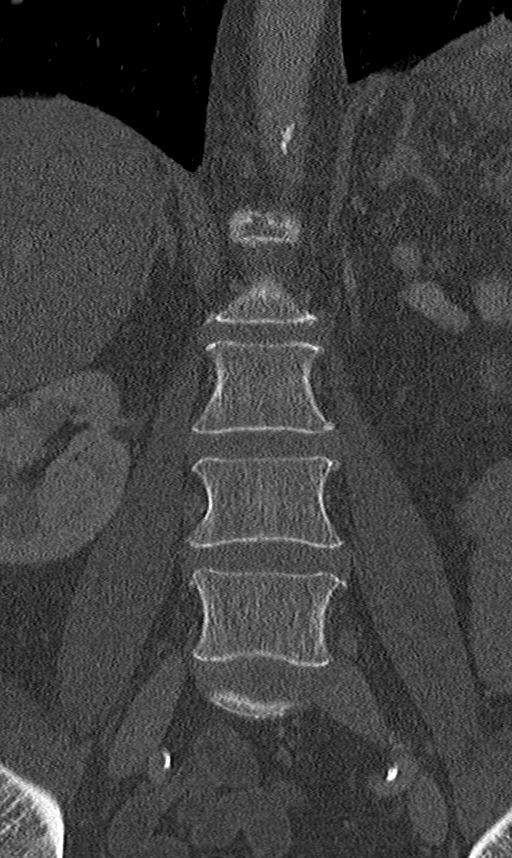
[im 567/1417  bone]
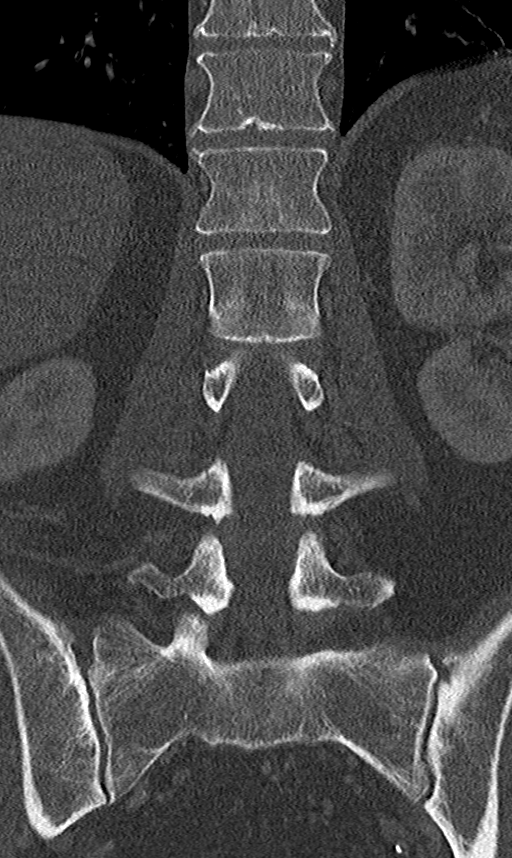
[im 850/1417  bone]
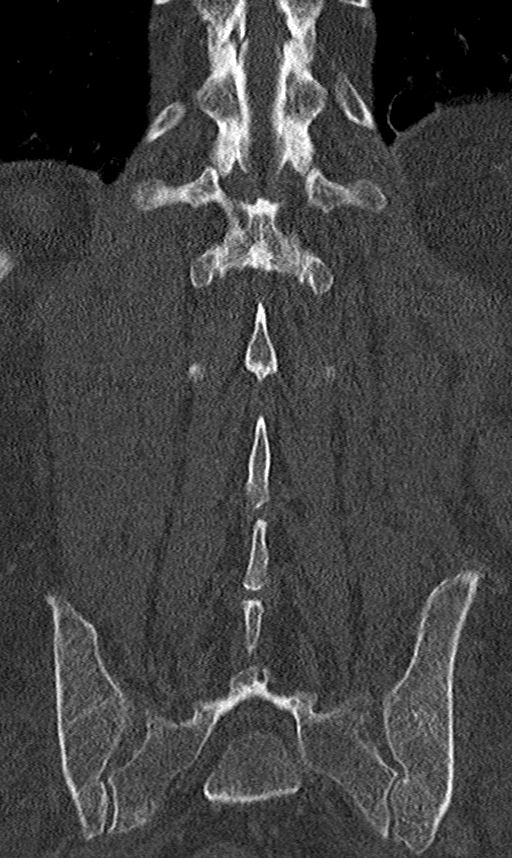

[Series 15: sagittal l-spine bone · sagittal · 0.33mm/px · 5 of 1183 slices shown, 6 images]
[im 395/1183  bone]
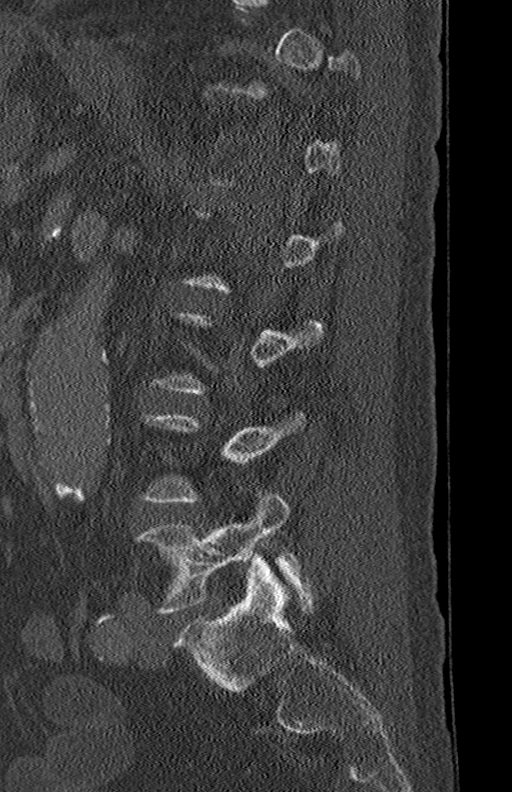
[im 493/1183  bone]
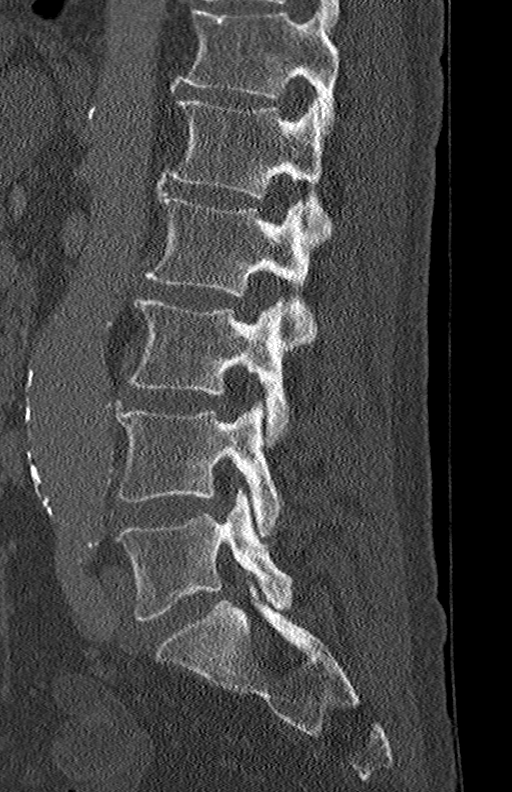
[im 592/1183  soft-tissue]
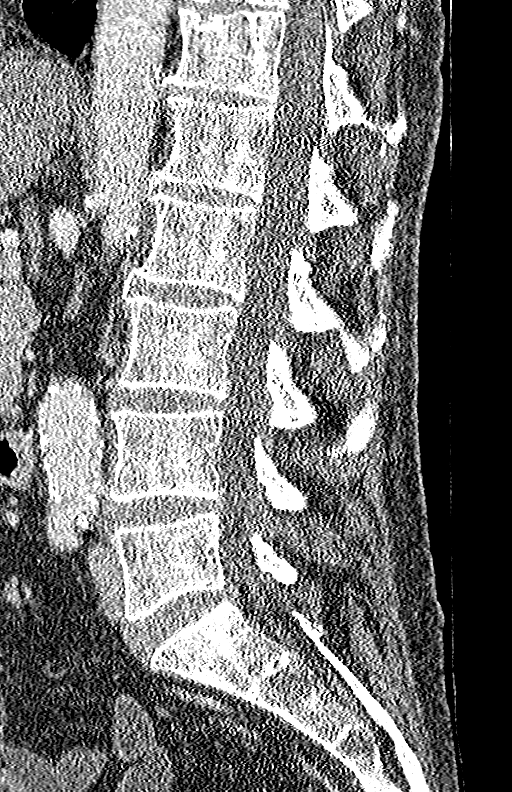
[im 592/1183  bone]
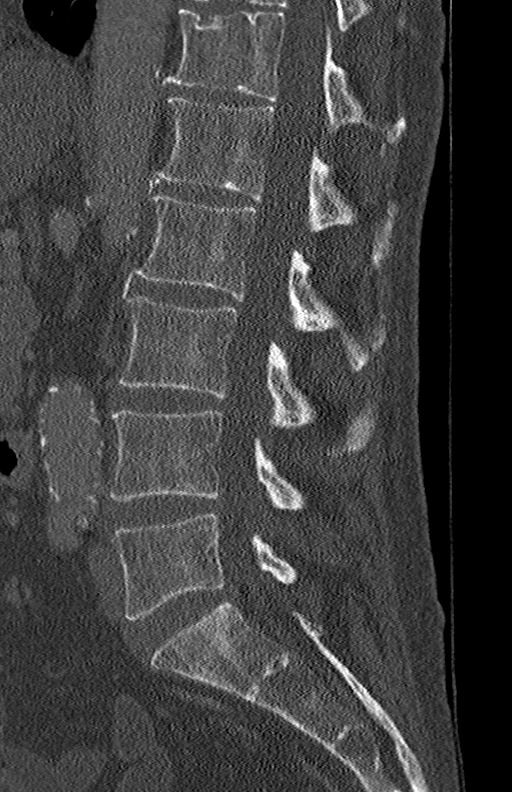
[im 690/1183  bone]
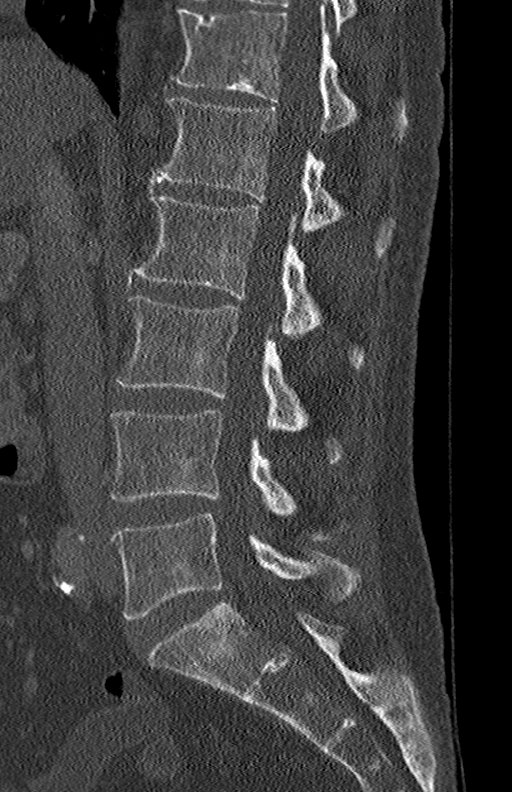
[im 789/1183  bone]
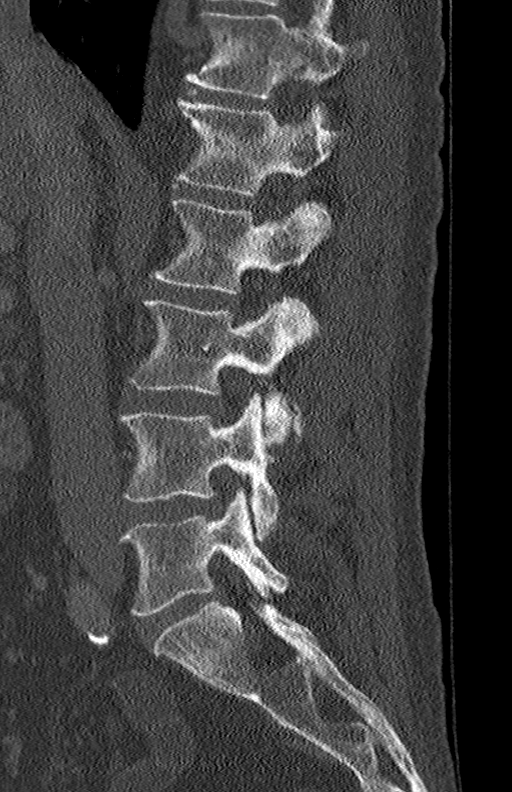

[12 of 33 positions shown; findings below may reference images not displayed]

FINDINGS: CT THORACIC SPINE FINDINGS

Alignment: Normal.

Vertebrae: No acute fracture or focal pathologic process.

Paraspinal and other soft tissues: Negative.

Disc levels: No acute findings.  Mild degenerative changes.

CT LUMBAR SPINE FINDINGS

Segmentation: 5 lumbar type vertebrae.

Alignment: Normal.

Vertebrae: No acute fracture or focal pathologic process.

Paraspinal and other soft tissues: Negative.

Disc levels: No acute findings. No significant degenerative changes.
IMPRESSION: No acute/traumatic thoracic or lumbar spine pathology.

## 2020-12-03 MED ORDER — MOMETASONE FURO-FORMOTEROL FUM 100-5 MCG/ACT IN AERO
2.0000 | INHALATION_SPRAY | Freq: Two times a day (BID) | RESPIRATORY_TRACT | Status: DC
  Administered 2020-12-03 – 2020-12-18 (×31): 2 via RESPIRATORY_TRACT
  Filled 2020-12-03: qty 8.8

## 2020-12-03 MED ORDER — MAGNESIUM HYDROXIDE 400 MG/5ML PO SUSP: 30.0000 mL | Freq: Every day | ORAL | Status: DC | PRN

## 2020-12-03 MED ORDER — ALBUTEROL SULFATE (2.5 MG/3ML) 0.083% IN NEBU
2.5000 mg | INHALATION_SOLUTION | Freq: Four times a day (QID) | RESPIRATORY_TRACT | Status: DC | PRN
  Administered 2020-12-10 – 2020-12-15 (×3): 2.5 mg via RESPIRATORY_TRACT
  Filled 2020-12-03 (×3): qty 3

## 2020-12-03 MED ORDER — IOHEXOL 350 MG/ML SOLN
80.0000 mL | Freq: Once | INTRAVENOUS | Status: AC | PRN
  Administered 2020-12-03: 80 mL via INTRAVENOUS

## 2020-12-03 MED ORDER — ENOXAPARIN SODIUM 40 MG/0.4ML IJ SOSY
40.0000 mg | PREFILLED_SYRINGE | INTRAMUSCULAR | Status: DC
  Administered 2020-12-03 – 2020-12-06 (×4): 40 mg via SUBCUTANEOUS
  Filled 2020-12-03 (×4): qty 0.4

## 2020-12-03 MED ORDER — ONDANSETRON HCL 4 MG/2ML IJ SOLN: 4.0000 mg | Freq: Four times a day (QID) | INTRAMUSCULAR | Status: DC | PRN

## 2020-12-03 MED ORDER — TBO-FILGRASTIM 480 MCG/0.8ML ~~LOC~~ SOSY
480.0000 ug | PREFILLED_SYRINGE | Freq: Every day | SUBCUTANEOUS | Status: AC
  Administered 2020-12-03 – 2020-12-05 (×3): 480 ug via SUBCUTANEOUS
  Filled 2020-12-03 (×4): qty 0.8

## 2020-12-03 MED ORDER — SODIUM CHLORIDE 0.9 % IV SOLN
2.0000 g | Freq: Three times a day (TID) | INTRAVENOUS | Status: DC
  Administered 2020-12-03 – 2020-12-07 (×12): 2 g via INTRAVENOUS
  Filled 2020-12-03 (×15): qty 2

## 2020-12-03 MED ORDER — ACETAMINOPHEN 650 MG RE SUPP: 650.0000 mg | Freq: Four times a day (QID) | RECTAL | Status: DC | PRN

## 2020-12-03 MED ORDER — SODIUM CHLORIDE 0.9 % IV SOLN: INTRAVENOUS | Status: DC

## 2020-12-03 MED ORDER — ONDANSETRON HCL 4 MG PO TABS: 4.0000 mg | ORAL_TABLET | Freq: Four times a day (QID) | ORAL | Status: DC | PRN

## 2020-12-03 MED ORDER — METRONIDAZOLE 500 MG/100ML IV SOLN
500.0000 mg | Freq: Two times a day (BID) | INTRAVENOUS | Status: DC
  Administered 2020-12-03 – 2020-12-07 (×9): 500 mg via INTRAVENOUS
  Filled 2020-12-03 (×12): qty 100

## 2020-12-03 MED ORDER — MORPHINE SULFATE (PF) 2 MG/ML IV SOLN
2.0000 mg | INTRAVENOUS | Status: DC | PRN
  Administered 2020-12-03 – 2020-12-09 (×10): 2 mg via INTRAVENOUS
  Filled 2020-12-03 (×10): qty 1

## 2020-12-03 MED ORDER — KCL IN DEXTROSE-NACL 20-5-0.9 MEQ/L-%-% IV SOLN
INTRAVENOUS | Status: AC
Start: 2020-12-03 — End: 2020-12-07
  Filled 2020-12-03 (×15): qty 1000

## 2020-12-03 MED ORDER — PANTOPRAZOLE SODIUM 40 MG IV SOLR
40.0000 mg | Freq: Two times a day (BID) | INTRAVENOUS | Status: DC
  Administered 2020-12-03 – 2020-12-18 (×32): 40 mg via INTRAVENOUS
  Filled 2020-12-03 (×32): qty 40

## 2020-12-03 MED ORDER — ADULT MULTIVITAMIN W/MINERALS CH
1.0000 | ORAL_TABLET | Freq: Every day | ORAL | Status: DC
  Administered 2020-12-03 – 2020-12-14 (×7): 1 via ORAL
  Filled 2020-12-03 (×9): qty 1

## 2020-12-03 MED ORDER — ACETAMINOPHEN 325 MG PO TABS
650.0000 mg | ORAL_TABLET | Freq: Four times a day (QID) | ORAL | Status: DC | PRN
  Administered 2020-12-03 – 2020-12-14 (×4): 650 mg via ORAL
  Filled 2020-12-03 (×4): qty 2

## 2020-12-03 MED ORDER — TRAZODONE HCL 50 MG PO TABS
25.0000 mg | ORAL_TABLET | Freq: Every evening | ORAL | Status: DC | PRN
  Administered 2020-12-08: 25 mg via ORAL
  Filled 2020-12-03: qty 1

## 2020-12-03 MED ORDER — SODIUM CHLORIDE 0.9 % IV SOLN
2.0000 g | Freq: Once | INTRAVENOUS | Status: AC
  Administered 2020-12-03: 2 g via INTRAVENOUS
  Filled 2020-12-03: qty 2

## 2020-12-03 MED ORDER — KCL IN DEXTROSE-NACL 40-5-0.9 MEQ/L-%-% IV SOLN: INTRAVENOUS | Status: DC

## 2020-12-03 MED ORDER — PIPERACILLIN-TAZOBACTAM 3.375 G IVPB 30 MIN
3.3750 g | Freq: Once | INTRAVENOUS | Status: AC
  Administered 2020-12-03: 3.375 g via INTRAVENOUS
  Filled 2020-12-03: qty 50

## 2020-12-03 MED ORDER — ALBUTEROL SULFATE HFA 108 (90 BASE) MCG/ACT IN AERS: 2.0000 | INHALATION_SPRAY | Freq: Four times a day (QID) | RESPIRATORY_TRACT | Status: DC | PRN

## 2020-12-03 MED ORDER — PANTOPRAZOLE SODIUM 40 MG IV SOLR: 40.0000 mg | Freq: Two times a day (BID) | INTRAVENOUS | Status: DC

## 2020-12-03 MED ORDER — POTASSIUM CHLORIDE 10 MEQ/100ML IV SOLN
10.0000 meq | INTRAVENOUS | Status: AC
Start: 2020-12-03 — End: 2020-12-03
  Administered 2020-12-03 (×4): 10 meq via INTRAVENOUS
  Filled 2020-12-03 (×2): qty 100

## 2020-12-03 MED ORDER — SODIUM CHLORIDE 0.9 % IV BOLUS
1000.0000 mL | Freq: Once | INTRAVENOUS | Status: AC
  Administered 2020-12-03: 1000 mL via INTRAVENOUS

## 2020-12-03 NOTE — Progress Notes (Signed)
Same day rounding progress note  Patient seen and examined while in the ED waiting for floor bed.  Please see Dr. Randel Books dictated history and physical for further details.  Sepsis present on admission likely due to underlying abdominal abscess/infection Contained perforated gastric ulcer Managed conservative for now per surgical team. Continue IV antibiotics and aggressive IV fluid resuscitation  Acute on chronic leukopenia -Followed at cancer center.  We will request consultation as he may benefit from Granix  Time spent: 20 minutes

## 2020-12-03 NOTE — ED Notes (Addendum)
Pt back from CT & provided a urinal.

## 2020-12-03 NOTE — ED Notes (Signed)
Pt in CT.

## 2020-12-03 NOTE — Progress Notes (Addendum)
12/03/20  Discussed patient with Dr. Cherylann Banas.  Patient presented to the ED tonight with right sided abdominal pain, back pain, shortness of breath and low grade fever at home.  He has a very complex medical as well as surgical history.  He's been admitted multiple times recently with hemoptysis and gastric bleeding s/p EGD and clipping in fundus, with cavitary pneumonia, with seizures.  Has history of drug use and was cocaine positive on one of his admissions.  Has also hx of Felty syndrome with neutropenia.  He has surgical history of splenectomy in 2017 and exploratory laparotomy with Phillip Heal patch repair of a pyloric channel perforated ulcer in 2021. On his workup in the ED, he was found to have a WBC of 1.7, Hgb 14.5, lactic acid 2.6, Na 127, Cl 93, chronically low albumin of 2.6.  CT scan of chest, abdomen, pelvis shows what looks like a contained perforation from the upper stomach with a air/fluid collection of 3.7 x 4.1 cm.  His cavitary pneumonia changes on CT are improved.  His heart rate is normal, stable normal blood pressure.  Most likely he has a contained perforated gastric ulcer.  I think given his significant medical history and comorbidities, prior major surgeries, and what looks like a contained perforation rather than free air, would attempt conservative management for now.  Recommend strict NPO (no sips, no ice chips), IV fluid hydration, IV PPI BID, and agree with IV Zosyn.  Full consult note in the morning.  Olean Ree, MD

## 2020-12-03 NOTE — ED Notes (Signed)
Phlebotomy @ the bedside to obtain cultures

## 2020-12-03 NOTE — H&P (Signed)
[place]   PATIENT NAME: Marcus [name]    MR#:  [id number]  DATE OF BIRTH:  22-Oct-1960  DATE OF ADMISSION:  12/02/2020  PRIMARY CARE PHYSICIAN: [name], FNP   Patient is coming from: Home  REQUESTING/REFERRING PHYSICIAN: Arta Silence, MD  CHIEF COMPLAINT:   Chief Complaint  Patient presents with  . Shortness of Breath    HISTORY OF PRESENT ILLNESS:  NIXON KOLTON is a 60 y.o. Caucasian male with medical history significant for COPD, rheumatoid arthritis, tobacco abuse and pancytopenia, as well as recent admission for cavitary pneumonia, who presented to the ER with acute onset of persistent dyspnea over the last several weeks since he was discharged from the hospital as well as fever of 100.8 at home.  He admitted to associated cough but denied any wheezing.  He has been having right-sided abdominal pain over the last 2 to 3 weeks after a fall with associated mid to lower back pain.  His pain has been extending to the left upper quadrant and epigastric area.  He has occasional heartburn and reflux.  No chest pain or palpitations.  No cough or wheezing.  He has a fever with occasional chills.  No dysuria, oliguria or hematuria or flank pain.  No bleeding diathesis.  ED Course: Upon presentation to the emergency room temperature was 99.9 with a heart rate of 105 and respiratory to of 28 and pulse ox 90 was 98% on 3 L O2 by nasal cannula.  Labs revealed hyponatremia hypochloremia and a BUN of 22 with alk phos 381 and albumin 2.6 with AST 51.  High-sensitivity troponin I was 22 twice and lactic acid was 1.9 later 2.6.  CBC showed leukopenia of 1.7.  Influenza antigens and COVID-19 PCR came back negative.  Blood culture was drawn.    EKG as reviewed by me : showed sinus tachycardia with rate 105 with left axis deviation right bundle branch block. Imaging: Two-view chest ray showed previously seen cavitary lesion on prior CT is not well visualized on today's radiograph and  there is a small likely residual cavitary lesion in the right lateral chest. Abdominal and pelvic CT scan revealed: 1. A 3.7 x 4.1 cm collection containing air and fluid in the left upper abdomen contiguous with the gastric fundus and corresponding to the previously seen surgical clips. Findings most concerning for sutural dehiscence and extraluminal collection or developing abscess. Clinical correlation and surgical consult is advised. 2. Diarrheal state. No bowel obstruction. Normal appendix. 3. A 3 cm fusiform infrarenal abdominal aortic aneurysm. Recommend follow-up ultrasound every 3 years. This recommendation follows ACR consensus guidelines: White Paper of the ACR Incidental Findings Committee II on Vascular Findings. J Am Coll Radiol 2013; 10:789-794. 4. Aortic Atherosclerosis   The patient was given IV Zosyn, IV Protonix and 1 L bolus of IV normal saline.  He will be admitted to a medical monitored bed for further evaluation and management. PAST MEDICAL HISTORY:   Past Medical History:  Diagnosis Date  . COPD (chronic obstructive pulmonary disease) ([place])   . 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 Hernia, epigastric   . Pancytopenia ([place])   . Seropositive rheumatoid arthritis ([place])   . Tobacco use disorder     PAST SURGICAL HISTORY:   Past Surgical History:  Procedure Laterality Date  . ESOPHAGOGASTRODUODENOSCOPY (EGD) WITH PROPOFOL N/A 10/01/2020   Procedure: ESOPHAGOGASTRODUODENOSCOPY (EGD) WITH PROPOFOL;  Surgeon: Mauri Pole, MD;  Location: [facility];  Service: Endoscopy;  Laterality: N/A;  . HEMOSTASIS CLIP PLACEMENT  10/01/2020   Procedure: HEMOSTASIS CLIP PLACEMENT;  Surgeon: Mauri Pole, MD;  Location: [facility];  Service: Endoscopy;;  . INCISIONAL HERNIA REPAIR  01/17/2020   Procedure: HERNIA REPAIR INCISIONAL AND Silvestre Gunner;  Surgeon: Kinsinger, Arta Bruce, MD;  Location: [facility] ORS;  Service: General;;  . IR ANGIOGRAM SELECTIVE EACH ADDITIONAL VESSEL   10/01/2020  . IR ANGIOGRAM SELECTIVE EACH ADDITIONAL VESSEL  10/01/2020  . IR ANGIOGRAM VISCERAL SELECTIVE  10/01/2020  . IR ANGIOGRAM VISCERAL SELECTIVE  10/01/2020  . IR US GUIDE VASC ACCESS RIGHT  10/01/2020  . LAPAROTOMY N/A 01/17/2020   Procedure: EXPLORATORY LAPAROTOMY;  Surgeon: Kieth Brightly Arta Bruce, MD;  Location: [facility] ORS;  Service: General;  Laterality: N/A;    SOCIAL HISTORY:   Social History   Tobacco Use  . Smoking status: Every Day    Packs/day: 1.00    Types: Cigarettes  . Smokeless tobacco: Never  Substance Use Topics  . Alcohol use: No    Alcohol/week: 0.0 standard drinks    FAMILY HISTORY:   Family History  Problem Relation Age of Onset  . CAD Brother   . COPD Brother     DRUG ALLERGIES:   Allergies  Allergen Reactions  . Levaquin [Levofloxacin In D5w] Other (See Comments)    Chest pain  . Methotrexate Other (See Comments)    Chest pain    REVIEW OF SYSTEMS:   ROS As per history of present illness. All pertinent systems were reviewed above. Constitutional, HEENT, cardiovascular, respiratory, GI, GU, musculoskeletal, neuro, psychiatric, endocrine, integumentary and hematologic systems were reviewed and are otherwise negative/unremarkable except for positive findings mentioned above in the HPI.   MEDICATIONS AT HOME:   Prior to Admission medications   Medication Sig Start Date End Date Taking? Authorizing Provider  albuterol (PROVENTIL) (2.5 MG/3ML) 0.083% nebulizer solution Take 3 mLs (2.5 mg total) by nebulization every 6 (six) hours as needed for wheezing or shortness of breath. 08/30/20  Yes Kathie Dike, MD  albuterol (VENTOLIN HFA) 108 (90 Base) MCG/ACT inhaler Inhale 2 puffs into the lungs every 6 (six) hours as needed for wheezing or shortness of breath. 08/30/20  Yes Kathie Dike, MD  feeding supplement (ENSURE ENLIVE / ENSURE PLUS) LIQD Take 237 mLs by mouth 2 (two) times daily between meals. 08/04/20  Yes Shah, Pratik D, DO   mometasone-formoterol (DULERA) 100-5 MCG/ACT AERO Inhale 2 puffs into the lungs 2 (two) times daily. 08/30/20  Yes Kathie Dike, MD  Multiple Vitamin (MULTIVITAMIN WITH MINERALS) TABS tablet Take 1 tablet by mouth daily. 11/03/20  Yes Lorella Nimrod, MD  mupirocin ointment (BACTROBAN) 2 % Place into the nose 2 (two) times daily. 09/28/20  Yes Nolberto Hanlon, MD  pantoprazole (PROTONIX) 40 MG tablet Take 1 tablet (40 mg total) by mouth 2 (two) times daily. 10/07/20  Yes Hosie Poisson, MD  predniSONE (DELTASONE) 10 MG tablet Take 1 tablet (10 mg total) by mouth 2 (two) times daily with a meal. 08/30/20  Yes Memon, Jolaine Artist, MD  nicotine (NICODERM CQ - DOSED IN MG/24 HOURS) 21 mg/24hr patch Place 1 patch (21 mg total) onto the skin daily. Patient not taking: No sig reported 11/03/20   Lorella Nimrod, MD      VITAL SIGNS:  Blood pressure 110/69, pulse 76, temperature 99.9 F (37.7 C), temperature source Oral, resp. rate 20, height '5\' 9"'  (1.753 m), weight 60 kg, SpO2 94 %.  PHYSICAL EXAMINATION:  Physical Exam  GENERAL:  60  y.o.-year-old Caucasian male patient lying in the bed with no acute distress.  EYES: Pupils equal, round, reactive to light and accommodation. No scleral icterus. Extraocular muscles intact.  HEENT: Head atraumatic, normocephalic. Oropharynx and nasopharynx clear.  NECK:  Supple, no jugular venous distention. No thyroid enlargement, no tenderness.  LUNGS: Normal breath sounds bilaterally, no wheezing, rales,rhonchi or crepitation. No use of accessory muscles of respiration.  CARDIOVASCULAR: Regular rate and rhythm, S1, S2 normal. No murmurs, rubs, or gallops.  ABDOMEN: Soft, nondistended, with mild generalized abdominal tenderness without rebound tenderness guarding or rigidity.  Bowel sounds present. No organomegaly or mass.  EXTREMITIES: No pedal edema, cyanosis, or clubbing.  NEUROLOGIC: Cranial nerves II through XII are intact. Muscle strength 5/5 in all extremities. Sensation  intact. Gait not checked.  PSYCHIATRIC: The patient is alert and oriented x 3.  Normal affect and good eye contact. SKIN: No obvious rash, lesion, or ulcer.   LABORATORY PANEL:   CBC Recent Labs  Lab 12/02/20 1732  WBC 1.7*  HGB 14.5  HCT 43.1  PLT 163   ------------------------------------------------------------------------------------------------------------------  Chemistries  Recent Labs  Lab 12/02/20 1732 12/02/20 2330  NA 127*  --   K 3.7  --   CL 93*  --   CO2 25  --   GLUCOSE 90  --   BUN 22*  --   CREATININE 0.78  --   CALCIUM 8.1*  --   AST  --  51*  ALT  --  11  ALKPHOS  --  381*  BILITOT  --  1.2   ------------------------------------------------------------------------------------------------------------------  Cardiac Enzymes No results for input(s): TROPONINI in the last 168 hours. ------------------------------------------------------------------------------------------------------------------  RADIOLOGY:  DG Chest 2 View  Result Date: 12/02/2020 CLINICAL DATA:  sob EXAM: CHEST - 2 VIEW COMPARISON:  October 24, 2020 FINDINGS: The cardiomediastinal silhouette is unchanged in contour.Emphysematous changes. No pleural effusion. No pneumothorax. Cavitating region described on prior CT is not well visualized radiographically. There is a likely small residual cavitary lesion in the RIGHT lateral chest visualized abdomen is unremarkable. Multilevel degenerative changes of the thoracic spine. IMPRESSION: Previously seen cavitary lesion on prior CT is not well visualized on today's radiograph. There is a small likely residual cavitary region in the RIGHT lateral chest. Electronically Signed   By: Valentino Saxon M.D.   On: 12/02/2020 18:20   CT CHEST ABDOMEN PELVIS W CONTRAST  Result Date: 12/03/2020 CLINICAL DATA:  Right-sided abdominal pain after fall. Persistent cough and shortness of breath. EXAM: CT CHEST, ABDOMEN, AND PELVIS WITH CONTRAST TECHNIQUE:  Multidetector CT imaging of the chest, abdomen and pelvis was performed following the standard protocol during bolus administration of intravenous contrast. CONTRAST:  48m OMNIPAQUE IOHEXOL 350 MG/ML SOLN COMPARISON:  Thoracic and lumbar spine CT dated 12/03/2020. FINDINGS: CT CHEST FINDINGS Cardiovascular: There is no cardiomegaly or pericardial effusion. Three-vessel coronary vascular calcification. Mild atherosclerotic calcification of the thoracic aorta. No aneurysmal dilatation or dissection. The origins of the great vessels of the aortic arch appear patent as visualized. The central pulmonary arteries appear unremarkable. Mediastinum/Nodes: No hilar or mediastinal adenopathy. The esophagus is grossly unremarkable. No mediastinal fluid collection. Lungs/Pleura: Background of centrilobular emphysema. An area of scarring and cystic changes in the right middle lobe anteriorly again noted. There has been interval improvement of the consolidative area associated with this cystic changes compared to prior CT. The cluster of apparent nodular density at the left lung base likely chronic or vascular/interstitial crowding (139/3). No consolidative changes. There is no  pleural effusion or pneumothorax. A 5 mm nodular density in the anterior and medial aspect of the left upper lobe (80/3), appears similar to prior CT, likely an area of scarring. The central airways are patent. Musculoskeletal: Several old right posterior rib fractures. No acute osseous pathology. CT ABDOMEN PELVIS FINDINGS No intra-abdominal free air or free fluid. Hepatobiliary: Fatty liver. No intrahepatic biliary ductal dilatation. Small gallstone. No pericholecystic fluid or evidence of acute cholecystitis by CT. Pancreas: Calcification or surgical material at the tip of the tail of the pancreas. The pancreas is otherwise unremarkable. Spleen: Splenectomy. Adrenals/Urinary Tract: The adrenal glands unremarkable. There is no hydronephrosis on either  side. There is symmetric enhancement and excretion of contrast by both kidneys. The visualized ureters and urinary bladder appear unremarkable. Stomach/Bowel: There is a 3.7 x 4.1 cm collection containing air and fluid in the left upper abdomen under the left hemidiaphragm. This appears to communicate with the stomach. This corresponds to the previously seen surgical clips involving the stomach and most concerning for sutural dehiscence and extraluminal collection or developing abscess. Clinical correlation and surgical consult is advised. There is no bowel obstruction. There is loose stool throughout the colon compatible with diarrheal state. The appendix is normal. There is a surgical clip in the right lower quadrant adjacent to the appendix, likely a displaced surgical clip of the stomach. Vascular/Lymphatic: Moderate aortoiliac atherosclerotic disease. There is a 3 cm fusiform infrarenal abdominal aortic aneurysm. The IVC is unremarkable. No portal venous gas. There is no adenopathy. Reproductive: The prostate and seminal vesicles are grossly unremarkable. No pelvic mass Other: Midline vertical anterior abdominal wall incisional scar. Small fat containing supraumbilical ventral hernia. Musculoskeletal: No acute or significant osseous findings. IMPRESSION: 1. A 3.7 x 4.1 cm collection containing air and fluid in the left upper abdomen contiguous with the gastric fundus and corresponding to the previously seen surgical clips. Findings most concerning for sutural dehiscence and extraluminal collection or developing abscess. Clinical correlation and surgical consult is advised. 2. Diarrheal state. No bowel obstruction. Normal appendix. 3. A 3 cm fusiform infrarenal abdominal aortic aneurysm. Recommend follow-up ultrasound every 3 years. This recommendation follows ACR consensus guidelines: White Paper of the ACR Incidental Findings Committee II on Vascular Findings. J Am Coll Radiol 2013; 47:829-562. 4. Aortic  Atherosclerosis (ICD10-I70.0) and Emphysema (ICD10-J43.9). Electronically Signed   By: Anner Crete M.D.   On: 12/03/2020 01:36   CT T-SPINE NO CHARGE  Result Date: 12/03/2020 CLINICAL DATA:  Fall and pain. EXAM: CT THORACIC AND LUMBAR SPINE WITHOUT CONTRAST TECHNIQUE: Multidetector CT imaging of the thoracic and lumbar spine was performed without contrast. Multiplanar CT image reconstructions were also generated. COMPARISON:  CT of the chest abdomen pelvis dated 12/03/2020. Chest CT dated 11/01/2020 and CT of the abdomen pelvis dated 10/03/2020. FINDINGS: CT THORACIC SPINE FINDINGS Alignment: Normal. Vertebrae: No acute fracture or focal pathologic process. Paraspinal and other soft tissues: Negative. Disc levels: No acute findings.  Mild degenerative changes. CT LUMBAR SPINE FINDINGS Segmentation: 5 lumbar type vertebrae. Alignment: Normal. Vertebrae: No acute fracture or focal pathologic process. Paraspinal and other soft tissues: Negative. Disc levels: No acute findings. No significant degenerative changes. IMPRESSION: No acute/traumatic thoracic or lumbar spine pathology. Electronically Signed   By: Anner Crete M.D.   On: 12/03/2020 01:23   CT L-SPINE NO CHARGE  Result Date: 12/03/2020 CLINICAL DATA:  Fall and pain. EXAM: CT THORACIC AND LUMBAR SPINE WITHOUT CONTRAST TECHNIQUE: Multidetector CT imaging of the thoracic and lumbar spine was performed  without contrast. Multiplanar CT image reconstructions were also generated. COMPARISON:  CT of the chest abdomen pelvis dated 12/03/2020. Chest CT dated 11/01/2020 and CT of the abdomen pelvis dated 10/03/2020. FINDINGS: CT THORACIC SPINE FINDINGS Alignment: Normal. Vertebrae: No acute fracture or focal pathologic process. Paraspinal and other soft tissues: Negative. Disc levels: No acute findings.  Mild degenerative changes. CT LUMBAR SPINE FINDINGS Segmentation: 5 lumbar type vertebrae. Alignment: Normal. Vertebrae: No acute fracture or focal  pathologic process. Paraspinal and other soft tissues: Negative. Disc levels: No acute findings. No significant degenerative changes. IMPRESSION: No acute/traumatic thoracic or lumbar spine pathology. Electronically Signed   By: Anner Crete M.D.   On: 12/03/2020 01:23      IMPRESSION AND PLAN:  Active Problems:   Intra-abdominal abscess ([place])  1.  Suspected intra-abdominal abscess in the left upper quadrant concerning for gastric perforation. - The patient be admitted to a medical monitored bed. - We will continue antibiotic therapy with IV cefepime and Flagyl. - The patient will be hydrated with IV normal saline. - We will keep him n.p.o. - Pain management to be provided. - The patient will be placed on IV PPI therapy. - We will hold off oral steroid therapy. - General surgery consultation will be obtained. - Dr. Hampton Abbot was notified about the patient and reviewed the case.  He recommended strict n.p.o. status and conservative management for now.  2.  Sepsis secondary to #1. - This is manifested by leukopenia and tachypnea as well as tachycardia. - We will continue the patient on IV cefepime and Flagyl per sepsis protocol.  3.  GERD. - The patient will be placed on IV PPI therapy.  4.  COPD without exacerbation. - He will be placed on as needed albuterol and will continue Dulera.  5.  Tobacco abuse. - We will utilize NicoDerm CQ patch as needed.  DVT prophylaxis: Lovenox. Code Status: full code. Family Communication:  The plan of care was discussed in details with the patient (and family). I answered all questions. The patient agreed to proceed with the above mentioned plan. Further management will depend upon hospital course. Disposition Plan: Back to previous home environment Consults called: General surgery All the records are reviewed and case discussed with ED provider.  Status is: Inpatient  Remains inpatient appropriate because:Ongoing active pain requiring  inpatient pain management, Ongoing diagnostic testing needed not appropriate for outpatient work up, Unsafe d/c plan, IV treatments appropriate due to intensity of illness or inability to take PO, and Inpatient level of care appropriate due to severity of illness  Dispo: The patient is from: Home              Anticipated d/c is to: Home              Patient currently is not medically stable to d/c.   Difficult to place patient No   TOTAL TIME TAKING CARE OF THIS PATIENT: 55 minutes.    Christel Mormon M.D on 12/03/2020 at 5:33 AM  [facility]   From 7 PM-7 AM, contact night-coverage [email or web address]  CC: Primary care physician; [name], FNP

## 2020-12-03 NOTE — Progress Notes (Signed)
Pharmacy Antibiotic Note  BRITTIN CEASE is a 60 y.o. male admitted on 12/02/2020 with intra-abdominal infection.  Pharmacy has been consulted for Cefepime dosing.  Plan: Cefepime 2 gm q8h per indication and renal fxn.  Pharmacy will continue to follow and adjust dose when warranted.  Height: '5\' 9"'$  (175.3 cm) Weight: 60 kg (132 lb 4.4 oz) IBW/kg (Calculated) : 70.7  Temp (24hrs), Avg:99.9 F (37.7 C), Min:99.9 F (37.7 C), Max:99.9 F (37.7 C)  Recent Labs  Lab 12/02/20 1732 12/02/20 2330 12/03/20 0544  WBC 1.7*  --   --   CREATININE 0.78  --   --   LATICACIDVEN 1.9 2.6* 1.3    Estimated Creatinine Clearance: 84.4 mL/min (by C-G formula based on SCr of 0.78 mg/dL).    Allergies  Allergen Reactions   Levaquin [Levofloxacin In D5w] Other (See Comments)    Chest pain   Methotrexate Other (See Comments)    Chest pain    Antimicrobials this admission: 8/29 Zosyn >> x 1 8/29 Cefepime >>  8/29 Flagyl >>  Microbiology results: 8/28 BCx: Pending  Thank you for allowing pharmacy to be a part of this patient's care.  Renda Rolls, PharmD, [facility] 12/03/2020 6:50 AM

## 2020-12-03 NOTE — ED Notes (Signed)
Family at bedside, provided with recliner.

## 2020-12-03 NOTE — Consult Note (Addendum)
[facility] SURGICAL CONSULTATION NOTE (initial) - [id number] (Outpatient/ED)   HISTORY OF PRESENT ILLNESS (HPI):  60 y.o. male presented to [facility] ED overnight for evaluation of SOB. Patient has a very complex recent medical history including multiple recent admission in May for left upper lobe MRSA pneumonia and subsequent readmission in June for now cavitary PNA. 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 On presentation last night, patient reports a several week history of intermittent worsening of his SOB and fever at home with T-max 100.34F. Additionally, he reports a fall at home around 2-3 weeks prior and has since complained of RLQ abdominal pain and mid-back pain. No complaints of chest pain, palpitation, urinary changes, nausea, emesis. He has surgical history of splenectomy in 2017 and exploratory laparotomy with Phillip Heal patch repair of a pyloric channel perforated ulcer in 2021. Work up in the ED revealed known leukopenia to 1.7K, renal function normal with sCr - 0.78, hyponatremia to 127, normal lactic acid level at 1.9, and CT Abdomen/Pelvis was concerning for possible contained gastric perforation. He was admitted to the medicine service and started on Cefepime and Flagyl.    Surgery is consulted by emergency medicine physician Dr. Arta Silence, MD in this context for evaluation and management of possible contained gastric perforation.   PAST MEDICAL HISTORY (PMH):  Past Medical History:  Diagnosis Date   COPD (chronic obstructive pulmonary disease) (HCC)    Felty syndrome (HCC)    Hernia, epigastric    Pancytopenia (HCC)    Seropositive rheumatoid arthritis ([place])    Tobacco use disorder      PAST SURGICAL HISTORY ([place]):  Past Surgical History:  Procedure Laterality Date   ESOPHAGOGASTRODUODENOSCOPY (EGD) WITH PROPOFOL  N/A 10/01/2020   Procedure: ESOPHAGOGASTRODUODENOSCOPY (EGD) WITH PROPOFOL;  Surgeon: Mauri Pole, MD;  Location: [facility] ENDOSCOPY;  Service: Endoscopy;  Laterality: N/A;   HEMOSTASIS CLIP PLACEMENT  10/01/2020   Procedure: HEMOSTASIS CLIP PLACEMENT;  Surgeon: Mauri Pole, MD;  Location: [facility] [facility] ENDOSCOPY;  Service: Endoscopy;;   INCISIONAL HERNIA REPAIR  01/17/2020   Procedure: HERNIA REPAIR INCISIONAL AND Silvestre Gunner;  Surgeon: Kinsinger, Arta Bruce, MD;  Location: [facility] ORS;  Service: General;;   IR ANGIOGRAM SELECTIVE EACH ADDITIONAL VESSEL  10/01/2020   IR ANGIOGRAM SELECTIVE EACH ADDITIONAL VESSEL  10/01/2020   IR ANGIOGRAM VISCERAL SELECTIVE  10/01/2020   IR ANGIOGRAM VISCERAL SELECTIVE  10/01/2020   IR US GUIDE [place] RIGHT  10/01/2020   LAPAROTOMY N/A 01/17/2020   Procedure: EXPLORATORY LAPAROTOMY;  Surgeon: Mickeal Skinner, MD;  Location: [facility] ORS;  Service: General;  Laterality: N/A;     MEDICATIONS:  Prior to Admission medications   Medication Sig Start Date End Date Taking? Authorizing Provider  albuterol (PROVENTIL) (2.5 MG/3ML) 0.083% nebulizer solution Take 3 mLs (2.5 mg total) by nebulization every 6 (six) hours as needed for wheezing or shortness of breath. 08/30/20  Yes Kathie Dike, MD  albuterol (VENTOLIN HFA) 108 (90 Base) MCG/ACT inhaler Inhale 2 puffs into the lungs every 6 (six) hours as needed for wheezing or shortness of breath. 08/30/20  Yes Kathie Dike, MD  feeding supplement (ENSURE ENLIVE / ENSURE PLUS) LIQD Take 237 mLs by mouth 2 (two) times daily between meals. 08/04/20  Yes Shah, Pratik D, DO  mometasone-formoterol (DULERA) 100-5 MCG/ACT AERO Inhale 2 puffs into the lungs 2 (  two) times daily. 08/30/20  Yes Kathie Dike, MD  Multiple Vitamin (MULTIVITAMIN WITH MINERALS) TABS tablet Take 1 tablet by mouth daily. 11/03/20  Yes Lorella Nimrod, MD  mupirocin ointment (BACTROBAN) 2 % Place into the nose 2 (two) times daily. 09/28/20  Yes Nolberto Hanlon, MD   pantoprazole (PROTONIX) 40 MG tablet Take 1 tablet (40 mg total) by mouth 2 (two) times daily. 10/07/20  Yes Hosie Poisson, MD  predniSONE (DELTASONE) 10 MG tablet Take 1 tablet (10 mg total) by mouth 2 (two) times daily with a meal. 08/30/20  Yes Memon, Jolaine Artist, MD  nicotine (NICODERM CQ - DOSED IN MG/24 HOURS) 21 mg/24hr patch Place 1 patch (21 mg total) onto the skin daily. Patient not taking: No sig reported 11/03/20   Lorella Nimrod, MD     ALLERGIES:  Allergies  Allergen Reactions   Levaquin [Levofloxacin In D5w] Other (See Comments)    Chest pain   Methotrexate Other (See Comments)    Chest pain     SOCIAL HISTORY:  Social History   Socioeconomic History   Marital status: Single    Spouse name: Not on file   Number of children: Not on file   Years of education: Not on file   Highest education level: Not on file  Occupational History   Not on file  Tobacco Use   Smoking status: Every Day    Packs/day: 1.00    Types: Cigarettes   Smokeless tobacco: Never  Substance and Sexual Activity   Alcohol use: No    Alcohol/week: 0.0 standard drinks   Drug use: Yes    Types: Marijuana   Sexual activity: Not on file  Other Topics Concern   Not on file  Social History Narrative   Lives at home with Mother. Ambulates independently.   Social Determinants of Health   Financial Resource Strain: Not on file  Food Insecurity: Not on file  Transportation Needs: Not on file  Physical Activity: Not on file  Stress: Not on file  Social Connections: Not on file  Intimate Partner Violence: Not on file     FAMILY HISTORY:  Family History  Problem Relation Age of Onset   CAD Brother    COPD Brother       REVIEW OF SYSTEMS:  Review of Systems  Constitutional:  Positive for fever. Negative for chills.  HENT:  Negative for congestion and sore throat.   Respiratory:  Negative for cough and shortness of breath.   Cardiovascular:  Negative for chest pain and palpitations.   Gastrointestinal:  Positive for abdominal pain. Negative for nausea and vomiting.  Genitourinary:  Negative for dysuria and urgency.  Musculoskeletal:  Positive for back pain and falls.  All other systems reviewed and are negative.  VITAL SIGNS:  Temp:  [99.9 F (37.7 C)] 99.9 F (37.7 C) (08/28 1730) Pulse Rate:  [76-109] 76 (08/29 0530) Resp:  [20-28] 20 (08/29 0530) BP: (101-132)/(67-85) 110/69 (08/29 0530) SpO2:  [94 %-98 %] 94 % (08/29 0530) Weight:  [60 kg] 60 kg (08/28 1720)     Height: '5\' 9"'$  (175.3 cm) Weight: 60 kg BMI (Calculated): 19.52   INTAKE/OUTPUT:  08/28 0701 - 08/29 0700 In: [id number] [IV [id number] Out: 250 [Urine:250]  PHYSICAL EXAM:  Physical Exam Vitals and nursing note reviewed. Exam conducted with a chaperone present.  Constitutional:      General: He is not in acute distress.    Appearance: He is not ill-appearing.     Interventions: Nasal cannula  in place.  HENT:     Head: Normocephalic and atraumatic.  Eyes:     Extraocular Movements: Extraocular movements intact.     Conjunctiva/sclera: Conjunctivae normal.  Cardiovascular:     Rate and Rhythm: Normal rate and regular rhythm.  Pulmonary:     Effort: Pulmonary effort is normal. No respiratory distress.  Abdominal:     General: A surgical scar is present. There is no distension.     Palpations: Abdomen is soft.     Tenderness: There is abdominal tenderness in the right lower quadrant and left upper quadrant. There is no guarding or rebound.     Hernia: A hernia is present. Hernia is present in the ventral area.     Comments: Abdomen is soft, interestingly he seems more tender in the RLQ but certainly endorse discomfort in the LUQ, non-distended, no rebound/guarding, no peritonitis. Previous midline laparotomy incision. He does have a small supraumbilical ventral/incisional hernia   Genitourinary:    Comments: Deferred Musculoskeletal:     Right lower leg: No edema.     Left lower leg: No  edema.  Skin:    General: Skin is warm and dry.     Coloration: Skin is not pale.     Findings: No erythema.  Neurological:     General: No focal deficit present.     Mental Status: He is alert and oriented to person, place, and time.  Psychiatric:        Mood and Affect: Mood normal.        Behavior: Behavior normal.     Labs:  CBC Latest Ref Rng & Units 12/02/2020 11/03/2020 11/02/2020  WBC 4.0 - 10.5 K/uL 1.7(L) 3.4(L) 11.0(H)  Hemoglobin 13.0 - 17.0 g/dL 14.5 13.1 12.2(L)  Hematocrit 39.0 - 52.0 % 43.1 40.3 38.4(L)  Platelets 150 - 400 K/uL 163 228 233   CMP Latest Ref Rng & Units 12/02/2020 11/03/2020 11/02/2020  Glucose 70 - 99 mg/dL 90 96 116(H)  BUN 6 - 20 mg/dL 22(H) 18 18  Creatinine 0.61 - 1.24 mg/dL 0.78 0.40(L) 0.41(L)  Sodium 135 - 145 mmol/L 127(L) 135 142  Potassium 3.5 - 5.1 mmol/L 3.7 4.3 3.6  Chloride 98 - 111 mmol/L 93(L) 95(L) 101  CO2 22 - 32 mmol/L 25 33(H) 34(H)  Calcium 8.9 - 10.3 mg/dL 8.1(L) 8.9 8.6(L)  Total Protein 6.5 - 8.1 g/dL 6.5 - -  Total Bilirubin 0.3 - 1.2 mg/dL 1.2 - -  Alkaline Phos 38 - 126 U/L 381(H) - -  AST 15 - 41 U/L 51(H) - -  ALT 0 - 44 U/L 11 - -     Imaging studies:   CT Abdomen/Pelvis (12/03/2020) personally reviewed with what appears to be continued air/fluid collection near the gastric fundus concerning for possible contained perforation, and radiologist report reviewed: IMPRESSION: 1. A 3.7 x 4.1 cm collection containing air and fluid in the left upper abdomen contiguous with the gastric fundus and corresponding to the previously seen surgical clips. Findings most concerning for sutural dehiscence and extraluminal collection or developing abscess. Clinical correlation and surgical consult is advised. 2. Diarrheal state. No bowel obstruction. Normal appendix. 3. A 3 cm fusiform infrarenal abdominal aortic aneurysm. Recommend follow-up ultrasound every 3 years. This recommendation follows ACR consensus guidelines: White  Paper of the ACR Incidental Findings Committee II on Vascular Findings. J Am Coll Radiol 2013CJ:3944253. 4. Aortic Atherosclerosis (ICD10-I70.0) and Emphysema (ICD10-J43.9).   Assessment/Plan: (ICD-10's: K25.5) 60 y.o. male with air/fluid collection near  the gastric fundus concerning for possible contained perforation in area of recent EGD with clipping, complicated by numerous pertinent comorbidities.   - Appreciate medicine admission - He needs to remain strict NPO - Continue IVF resuscitation - Continue IV Abx (Cefepime, Flagyl)   - Monitor abdominal examination - May need UGI reassessment; possibly Wednesday (08/31) pending clinical condition - No emergent surgical intervention; He understands that should he clinically deteriorate, we would need to likely intervene in urgent fashion  - Okay to ambulate   - Further management per primary service   All of the above findings and recommendations were discussed with the patient, and all of patient's questions were answered to his expressed satisfaction.  Thank you for the opportunity to participate in this patient's care.   -- Edison Simon, PA-C [facility] 12/03/2020, 7:39 AM [phone] M-F: 7am - 4pm

## 2020-12-03 NOTE — ED Provider Notes (Signed)
[facility] Emergency Department Provider Note ____________________________________________   Event Date/Time   First MD Initiated Contact with Patient 12/02/20 2322     (approximate)  I have reviewed the triage vital signs and the nursing notes.   HISTORY  Chief Complaint Shortness of Breath    HPI Marcus [name] is a 60 y.o. male with PMH as noted below including COPD, Felty syndrome, and recent admissions for cavitary pneumonia, who presents with persistent shortness of breath over the last several weeks since he was discharged in the hospital, associated with fever to 100.8 at home.  The patient reports associated cough but no chest pain.  He also reports pain to the right side of his abdomen after a fall that he says was 2 to 3 weeks ago, and some mid to lower back pain.   Past Medical History:  Diagnosis Date   COPD (chronic obstructive pulmonary disease) (HCC)    Felty syndrome (HCC)    Hernia, epigastric    Pancytopenia (HCC)    Seropositive rheumatoid arthritis (HCC)    Tobacco use disorder     Patient Active Problem List   Diagnosis Date Noted   Intra-abdominal abscess ([place]) 12/03/2020   Unresponsive    SOB (shortness of breath)    Seizure-like activity (HCC)    Acute respiratory failure with hypoxia ([place]) 10/30/2020   Cirrhosis of liver (HCC)    GI bleed 10/01/2020   Hemorrhagic shock ([place]) 10/01/2020   Acute blood loss anemia 10/01/2020   Bleeding gastric varices    Bacteremia    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HCC)    Massive hemoptysis 09/30/2020   AKI (acute kidney injury) ([place]) 09/30/2020   Protein-calorie malnutrition, severe 09/19/2020   Lung abscess ([place]) 09/18/2020   Respiratory failure with hypoxia ([place]) 09/17/2020   Hemoptysis 09/17/2020   Acquired neutropenia ([place]) 08/28/2020   Hypothermia 08/24/2020   Cavitary pneumonia 08/22/2020   Acute on chronic respiratory failure with hypoxia ([place]) 08/22/2020    Hyponatremia 08/04/2020   Generalized weakness 08/04/2020   Dehydration 08/03/2020   Septic shock ([place]) 06/29/2020   CAP (community acquired pneumonia) 06/29/2020   COPD exacerbation ([place]) 06/11/2020   Chest pain 06/11/2020   Cellulitis 06/11/2020   Chronic diastolic CHF (congestive heart failure) ([place]) 06/11/2020   Sepsis due to undetermined organism ([place]) 01/20/2020   Acute hypoxemic respiratory failure ([place]) 01/20/2020   Hypokalemia 01/20/2020   Perforated prepyloric ulcer 01/18/2020 01/18/2020   Pneumoperitoneum 01/17/2020   COPD with acute exacerbation ([place]) 08/18/2019   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   Anemia 11/14/2015   Seropositive rheumatoid arthritis (HCC)    Chronic obstructive pulmonary disease (HCC)    Moderate protein-calorie malnutrition ([place]) 07/24/2015   Pancytopenia ([place]) 07/23/2015   Felty syndrome ([place]) 06/06/2013   Leukopenia 10/21/2012   Axillary abscess 10/21/2012   Abnormal EKG 10/21/2012   Joint pain 10/21/2012   Splenomegaly 10/21/2012    Past Surgical History:  Procedure Laterality Date   ESOPHAGOGASTRODUODENOSCOPY (EGD) WITH PROPOFOL N/A 10/01/2020   Procedure: ESOPHAGOGASTRODUODENOSCOPY (EGD) WITH PROPOFOL;  Surgeon: Mauri Pole, MD;  Location: [facility];  Service: Endoscopy;  Laterality: N/A;   HEMOSTASIS CLIP PLACEMENT  10/01/2020   Procedure: HEMOSTASIS CLIP PLACEMENT;  Surgeon: Mauri Pole, MD;  Location: [facility];  Service: Endoscopy;;   INCISIONAL HERNIA REPAIR  01/17/2020   Procedure: HERNIA REPAIR INCISIONAL AND Silvestre Gunner;  Surgeon: Kinsinger, Arta Bruce, MD;  Location: [facility];  Service: General;;   IR ANGIOGRAM SELECTIVE  EACH ADDITIONAL VESSEL  10/01/2020   IR ANGIOGRAM SELECTIVE EACH ADDITIONAL VESSEL  10/01/2020   IR ANGIOGRAM VISCERAL SELECTIVE  10/01/2020   IR ANGIOGRAM VISCERAL SELECTIVE  10/01/2020   IR US GUIDE VASC ACCESS RIGHT  10/01/2020   LAPAROTOMY N/A 01/17/2020   Procedure: EXPLORATORY LAPAROTOMY;  Surgeon:  Mickeal Skinner, MD;  Location: [facility];  Service: General;  Laterality: N/A;    Prior to Admission medications   Medication Sig Start Date End Date Taking? Authorizing Provider  albuterol (PROVENTIL) (2.5 MG/3ML) 0.083% nebulizer solution Take 3 mLs (2.5 mg total) by nebulization every 6 (six) hours as needed for wheezing or shortness of breath. 08/30/20  Yes Kathie Dike, MD  albuterol (VENTOLIN HFA) 108 (90 Base) MCG/ACT inhaler Inhale 2 puffs into the lungs every 6 (six) hours as needed for wheezing or shortness of breath. 08/30/20  Yes Kathie Dike, MD  feeding supplement (ENSURE ENLIVE / ENSURE PLUS) LIQD Take 237 mLs by mouth 2 (two) times daily between meals. 08/04/20  Yes Shah, Pratik D, DO  mometasone-formoterol (DULERA) 100-5 MCG/ACT AERO Inhale 2 puffs into the lungs 2 (two) times daily. 08/30/20  Yes Kathie Dike, MD  Multiple Vitamin (MULTIVITAMIN WITH MINERALS) TABS tablet Take 1 tablet by mouth daily. 11/03/20  Yes Lorella Nimrod, MD  mupirocin ointment (BACTROBAN) 2 % Place into the nose 2 (two) times daily. 09/28/20  Yes Nolberto Hanlon, MD  pantoprazole (PROTONIX) 40 MG tablet Take 1 tablet (40 mg total) by mouth 2 (two) times daily. 10/07/20  Yes Hosie Poisson, MD  predniSONE (DELTASONE) 10 MG tablet Take 1 tablet (10 mg total) by mouth 2 (two) times daily with a meal. 08/30/20  Yes Memon, Jolaine Artist, MD  nicotine (NICODERM CQ - DOSED IN MG/24 HOURS) 21 mg/24hr patch Place 1 patch (21 mg total) onto the skin daily. Patient not taking: No sig reported 11/03/20   Lorella Nimrod, MD    Allergies Levaquin [levofloxacin in d5w] and Methotrexate  Family History  Problem Relation Age of Onset   CAD Brother    COPD Brother     Social History Social History   Tobacco Use   Smoking status: Every Day    Packs/day: 1.00    Types: Cigarettes   Smokeless tobacco: Never  Substance Use Topics   Alcohol use: No    Alcohol/week: 0.0 standard drinks   Drug use: Yes    Types:  Marijuana    Review of Systems  Constitutional: Positive for fever. Eyes: No redness. ENT: No sore throat. Cardiovascular: Denies chest pain. Respiratory: Positive for shortness of breath. Gastrointestinal: No vomiting or diarrhea.  Genitourinary: Negative for dysuria.  Musculoskeletal: Positive for back pain. Skin: Negative for rash. Neurological: Negative for headache.   ____________________________________________   PHYSICAL EXAM:  VITAL SIGNS: ED Triage Vitals  Enc Vitals Group     BP 12/02/20 1730 116/75     Pulse Rate 12/02/20 1730 (!) 105     Resp 12/02/20 1730 (!) 28     Temp 12/02/20 1730 99.9 F (37.7 C)     Temp Source 12/02/20 1730 Oral     SpO2 12/02/20 1730 98 %     Weight 12/02/20 1720 132 lb 4.4 oz (60 kg)     Height 12/02/20 1720 '5\' 9"'$  (1.753 m)     Head Circumference --      Peak Flow --      Pain Score 12/02/20 1719 10     Pain Loc --  Pain Edu? --      Excl. in [place]? --     Constitutional: Alert and oriented.  Chronically ill and weak appearing but in no acute distress. Eyes: Conjunctivae are normal.  EOMI. Head: Atraumatic. Nose: No congestion/rhinnorhea. Mouth/Throat: Mucous membranes are slightly dry. Neck: Normal range of motion.  Cardiovascular: Normal rate, regular rhythm. Grossly normal heart sounds.  Good peripheral circulation. Respiratory: Slightly increased respiratory effort.  No retractions. Lungs CTAB. Gastrointestinal: Soft with mild right upper and lower quadrant tenderness.  No distention.  Genitourinary: No flank tenderness. Musculoskeletal: No lower extremity edema.  Extremities warm and well perfused.  Mild lower thoracic/upper lumbar midline spinal tenderness with no step-off or crepitus. Neurologic:  Normal speech and language.  Motor intact in all extremities. Skin:  Skin is warm and dry. No rash noted. Psychiatric: Calm and cooperative.  ____________________________________________   LABS (all labs ordered are  listed, but only abnormal results are displayed)  Labs Reviewed  BASIC METABOLIC PANEL - Abnormal; Notable for the following components:      Result Value   Sodium 127 (*)    Chloride 93 (*)    BUN 22 (*)    Calcium 8.1 (*)    All other components within normal limits  CBC - Abnormal; Notable for the following components:   WBC 1.7 (*)    RDW 18.4 (*)    nRBC 1.2 (*)    All other components within normal limits  LACTIC ACID, PLASMA - Abnormal; Notable for the following components:   Lactic Acid, Venous 2.6 (*)    All other components within normal limits  URINALYSIS, COMPLETE (UACMP) WITH MICROSCOPIC - Abnormal; Notable for the following components:   Color, Urine YELLOW (*)    APPearance HAZY (*)    Specific Gravity, Urine >1.046 (*)    Hgb urine dipstick MODERATE (*)    All other components within normal limits  HEPATIC FUNCTION PANEL - Abnormal; Notable for the following components:   Albumin 2.6 (*)    AST 51 (*)    Alkaline Phosphatase 381 (*)    Bilirubin, Direct 0.5 (*)    All other components within normal limits  APTT - Abnormal; Notable for the following components:   aPTT 37 (*)    All other components within normal limits  TROPONIN I (HIGH SENSITIVITY) - Abnormal; Notable for the following components:   Troponin I (High Sensitivity) 22 (*)    All other components within normal limits  TROPONIN I (HIGH SENSITIVITY) - Abnormal; Notable for the following components:   Troponin I (High Sensitivity) 22 (*)    All other components within normal limits  RESP PANEL BY RT-PCR (FLU A&B, COVID) ARPGX2  CULTURE, BLOOD (ROUTINE X 2)  CULTURE, BLOOD (ROUTINE X 2)  LACTIC ACID, PLASMA  URINE DRUG SCREEN, QUALITATIVE ([facility] ONLY)  LACTIC ACID, PLASMA  PROTIME-INR  PROCALCITONIN  LACTIC ACID, PLASMA   ____________________________________________  EKG  ED ECG REPORT I, Arta Silence, the attending physician, personally viewed and interpreted this ECG.  Date:  12/03/2020 EKG Time: 1730 Rate: 105 Rhythm: Sinus tachycardia QRS Axis: Left axis deviation Intervals: RBBB ST/T Wave abnormalities: normal Narrative Interpretation: Nonspecific abnormalities with no evidence of acute ischemia  ____________________________________________  RADIOLOGY  Chest x-ray interpreted by me shows small cavitary lesion in the right chest.  No other focal consolidation or edema.  ____________________________________________   PROCEDURES  Procedure(s) performed: No  Procedures  Critical Care performed: Yes  CRITICAL CARE Performed by: Arta Silence  Total critical care time: 30 minutes  Critical care time was exclusive of separately billable procedures and treating other patients.  Critical care was necessary to treat or prevent imminent or life-threatening deterioration.  Critical care was time spent personally by me on the following activities: development of treatment plan with patient and/or surrogate as well as nursing, discussions with consultants, evaluation of patient's response to treatment, examination of patient, obtaining history from patient or surrogate, ordering and performing treatments and interventions, ordering and review of laboratory studies, ordering and review of radiographic studies, pulse oximetry and re-evaluation of patient's condition.  ____________________________________________   INITIAL IMPRESSION / ASSESSMENT AND PLAN / ED COURSE  Pertinent labs & imaging results that were available during my care of the patient were reviewed by me and considered in my medical decision making (see chart for details).   60 year old male with extensive PMH as noted above including COPD, Felty syndrome, and recent admissions for cavitary pneumonia presents with persistent shortness of breath associated with fever.  He also has right-sided abdominal and back pain after a fall a few weeks ago.  I reviewed the past medical records in  [place].  The patient has had multiple recent admissions as follows:  08/22/2020 until 08/30/2020 left upper lobe MRSA pneumonia with abscess formation.  After initial IV antibiotics was switched to doxycycline for 4 to 6 weeks and was discharged home.  09/17/2020 until 09/28/2020: Readmitted for hemoptysis -MRSA cavitating pneumonia with hemoptysis.  Started on linezolid   6/26 until 10/07/2020 was readmitted to [facility] then transferred to [facility]  for hemoptysis/hematemesis.  He also had Klebsiella bacteremia which was treated.  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  Sent home on doxycycline and Flagyl to complete the course for the cavitary pneumonia for 19 more days.  7/26 to 11/03/20 admitted with agonal breathing after a seizure and intubated.  Acute metabolic encephalopathy.  Lung abscess improved.  Treated with vanc and ceftriaxone, then changed to doxycycline.    On exam currently, the patient is alert and oriented.  He is relatively comfortable appearing and in no acute distress.  He has a borderline elevated temperature and borderline tachycardia.  O2 saturation is in the mid 90s on 3 L by nasal cannula.  Lungs are clear to auscultation bilaterally and there is no significant peripheral edema.  The patient does have tenderness in the right side of his abdomen and also in the lower thoracic spine with no step-off or crepitus.  Differential includes recurrent or persistent pneumonia, COVID-19, bronchitis, COPD, or cardiac etiology, as well as intra-abdominal etiologies including organ injury due to the fall, colitis, diverticulitis, or hepatobiliary cause.  Chest x-ray is nondiagnostic.  We will obtain a CT of the chest, abdomen, and pelvis, lab work-up, and reassess.  ----------------------------------------- 3:22 AM on 12/03/2020 -----------------------------------------  CT of the  chest shows cystic changes in the lungs but no acute findings to suggest pneumonia.  However, there is a fluid collection and air in the upper abdomen suggestive of surgical dehiscence from prior surgery and possible abscess.  Lactate is also slightly elevated.  I have ordered fluids and Zosyn.  The patient remains hemodynamically stable.  I have paged surgery for consult.  ----------------------------------------- 3:44 AM on 12/03/2020 -----------------------------------------  I consulted Dr. Hampton Abbot from general surgery who reviewed the images.  He advises that the area seen on  the CT appears contained and likely will not need urgent surgical intervention.  He recommends IV antibiotics, which I have already ordered, and admission to the hospitalist service.  The patient remains hemodynamically stable.  ----------------------------------------- 3:57 AM on 12/03/2020 -----------------------------------------  By consulted Dr. Sidney Ace from the hospitalist service for admission.  ____________________________________________   FINAL CLINICAL IMPRESSION(S) / ED DIAGNOSES  Final diagnoses:  Abdominal pain  Back pain  Gastric perforation (HCC)  Intra-abdominal abscess (HCC)      NEW MEDICATIONS STARTED DURING THIS VISIT:  New Prescriptions   No medications on file     Note:  This document was prepared using Dragon voice recognition software and may include unintentional dictation errors.    Arta Silence, MD 12/03/20 [phone]

## 2020-12-03 NOTE — Consult Note (Signed)
Hematology/Oncology Consult note [facility]  Telephone:([phone] [phone]    Patient Care Team: [name], FNP as PCP - General ([place], [facility]) Marlowe Sax, MD as Referring Physician (Internal Medicine) Cammie Sickle, MD as Consulting Physician (Hematology and Oncology)   Name of the patient: Marcus [name]  [id number]  27-May-1960   Date of visit: 12/03/2020  History of Presenting Illness-patient is a 60 year old male with history of rheumatoid arthritis, Felty syndrome, COPD, chronic oxygen use 2 L, chronic smoker, MRSA pneumonia, who presented to ER on 12/02/2020 from home for failure to thrive, fever, loss of appetite, abdominal pain.  Lab work showed WBC of 1.7, sodium 127, lactic acid 1.9 but CT scan of abdomen pelvis showed possible upper gastric perforation with fluid air collection.  He was started on antibiotics.  Surgery has recommended conservative treatment given his complex medical history.  Heme-onc consulted by internal medicine physician for evaluation and management of neutropenia.   He feels tired, hungry, mild abdominal discomfort. Says he's been sick for a year.   ECOG PS- 2-3  Review of Systems  Constitutional:  Positive for chills, fever, malaise/fatigue and weight loss.  HENT:  Negative for hearing loss, nosebleeds, sore throat and tinnitus.   Eyes:  Negative for blurred vision and double vision.  Respiratory:  Negative for cough, hemoptysis, shortness of breath and wheezing.   Cardiovascular:  Negative for chest pain, palpitations and leg swelling.  Gastrointestinal:  Positive for abdominal pain. Negative for blood in stool, constipation, diarrhea, melena, nausea and vomiting.  Genitourinary:  Negative for dysuria and urgency.  Musculoskeletal:  Negative for back pain, falls, joint pain and myalgias.  Skin:  Negative for itching and rash.  Neurological:   Positive for weakness. Negative for dizziness, tingling, sensory change, loss of consciousness and headaches.  Endo/Heme/Allergies:  Negative for environmental allergies. Does not bruise/bleed easily.  Psychiatric/Behavioral:  Negative for depression. The patient is not nervous/anxious and does not have insomnia.    Allergies  Allergen Reactions   Levaquin [Levofloxacin In D5w] Other (See Comments)    Chest pain   Methotrexate Other (See Comments)    Chest pain    Patient Active Problem List   Diagnosis Date Noted   Intra-abdominal abscess ([place]) 12/03/2020   Unresponsive    SOB (shortness of breath)    Seizure-like activity ([place])    Acute respiratory failure with hypoxia ([place]) 10/30/2020   Cirrhosis of liver ([place])    GI bleed 10/01/2020   Hemorrhagic shock ([place]) 10/01/2020   Acute blood loss anemia 10/01/2020   Bleeding gastric varices    Bacteremia    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HCC)    Massive hemoptysis 09/30/2020   AKI (acute kidney injury) ([place]) 09/30/2020   Protein-calorie malnutrition, severe 09/19/2020   Lung abscess ([place]) 09/18/2020   Respiratory failure with hypoxia ([place]) 09/17/2020   Hemoptysis 09/17/2020   Acquired neutropenia ([place]) 08/28/2020   Hypothermia 08/24/2020   Cavitary pneumonia 08/22/2020   Acute on chronic respiratory failure with hypoxia ([place]) 08/22/2020   Hyponatremia 08/04/2020   Generalized weakness 08/04/2020   Dehydration 08/03/2020   Septic shock ([place]) 06/29/2020   CAP (community acquired pneumonia) 06/29/2020   COPD exacerbation ([place]) 06/11/2020   Chest pain 06/11/2020   Cellulitis 06/11/2020   Chronic diastolic CHF (congestive heart failure) ([place]) 06/11/2020   Sepsis due to undetermined organism ([place]) 01/20/2020   Acute hypoxemic respiratory failure ([place]) 01/20/2020   Hypokalemia  01/20/2020   Perforated prepyloric ulcer 01/18/2020 01/18/2020   Pneumoperitoneum 01/17/2020   COPD with acute exacerbation ([place])  08/18/2019   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   Anemia 11/14/2015   Seropositive rheumatoid arthritis ([place])    Chronic obstructive pulmonary disease (HCC)    Moderate protein-calorie malnutrition ([place]) 07/24/2015   Pancytopenia ([place]) 07/23/2015   Felty syndrome ([place]) 06/06/2013   Leukopenia 10/21/2012   Axillary abscess 10/21/2012   Abnormal EKG 10/21/2012   Joint pain 10/21/2012   Splenomegaly 10/21/2012     Past Medical History:  Diagnosis Date   COPD (chronic obstructive pulmonary disease) (HCC)    Felty syndrome (HCC)    Hernia, epigastric    Pancytopenia (HCC)    Seropositive rheumatoid arthritis (HCC)    Tobacco use disorder      Past Surgical History:  Procedure Laterality Date   ESOPHAGOGASTRODUODENOSCOPY (EGD) WITH PROPOFOL N/A 10/01/2020   Procedure: ESOPHAGOGASTRODUODENOSCOPY (EGD) WITH PROPOFOL;  Surgeon: Mauri Pole, MD;  Location: [facility];  Service: Endoscopy;  Laterality: N/A;   HEMOSTASIS CLIP PLACEMENT  10/01/2020   Procedure: HEMOSTASIS CLIP PLACEMENT;  Surgeon: Mauri Pole, MD;  Location: [facility];  Service: Endoscopy;;   INCISIONAL HERNIA REPAIR  01/17/2020   Procedure: HERNIA REPAIR INCISIONAL AND Silvestre Gunner;  Surgeon: Kinsinger, Arta Bruce, MD;  Location: [facility];  Service: General;;   IR ANGIOGRAM SELECTIVE EACH ADDITIONAL VESSEL  10/01/2020   IR ANGIOGRAM SELECTIVE EACH ADDITIONAL VESSEL  10/01/2020   IR ANGIOGRAM VISCERAL SELECTIVE  10/01/2020   IR ANGIOGRAM VISCERAL SELECTIVE  10/01/2020   IR US GUIDE VASC ACCESS RIGHT  10/01/2020   LAPAROTOMY N/A 01/17/2020   Procedure: EXPLORATORY LAPAROTOMY;  Surgeon: Mickeal Skinner, MD;  Location: [facility];  Service: General;  Laterality: N/A;    Social History   Socioeconomic History   Marital status: Single    Spouse name: Not on file   Number of children: Not on file   Years of education: Not on file   Highest education level: Not on file  Occupational History   Not on file   Tobacco Use   Smoking status: Every Day    Packs/day: 1.00    Types: Cigarettes   Smokeless tobacco: Never  Substance and Sexual Activity   Alcohol use: No    Alcohol/week: 0.0 standard drinks   Drug use: Yes    Types: Marijuana   Sexual activity: Not on file  Other Topics Concern   Not on file  Social History Narrative   Lives at home with Mother. Ambulates independently.   Social Determinants of Health   Financial Resource Strain: Not on file  Food Insecurity: Not on file  Transportation Needs: Not on file  Physical Activity: Not on file  Stress: Not on file  Social Connections: Not on file  Intimate Partner Violence: Not on file  Patient lives at home with his mother who is 17 years old.  Family History  Problem Relation Age of Onset   CAD Brother    COPD Brother     Current Facility-Administered Medications:    acetaminophen (TYLENOL) tablet 650 mg, 650 mg, Oral, Q6H PRN **OR** acetaminophen (TYLENOL) suppository 650 mg, 650 mg, Rectal, Q6H PRN, Mansy, Jan A, MD   albuterol (PROVENTIL) (2.5 MG/3ML) 0.083% nebulizer solution 2.5 mg, 2.5 mg, Nebulization, Q6H PRN, Mansy, Jan A, MD   ceFEPIme (MAXIPIME) 2 g in sodium chloride 0.9 % 100 mL IVPB, 2 g, Intravenous, Q8H, Belue, Nathan S, RPH, Last Rate: 200 mL/hr  at 12/03/20 1610, 2 g at 12/03/20 1610   dextrose 5 % and 0.9 % NaCl with KCl 20 mEq/L infusion, , Intravenous, Continuous, Piscoya, Jose, MD   enoxaparin (LOVENOX) injection 40 mg, 40 mg, Subcutaneous, Q24H, Mansy, Jan A, MD, 40 mg at 12/03/20 0900   magnesium hydroxide (MILK OF MAGNESIA) suspension 30 mL, 30 mL, Oral, Daily PRN, Mansy, Jan A, MD   metroNIDAZOLE (FLAGYL) IVPB 500 mg, 500 mg, Intravenous, Q12H, Mansy, Jan A, MD, Stopped at 12/03/20 0802   mometasone-formoterol (DULERA) 100-5 MCG/ACT inhaler 2 puff, 2 puff, Inhalation, BID, Mansy, Jan A, MD, 2 puff at 12/03/20 [id number]   multivitamin with minerals tablet 1 tablet, 1 tablet, Oral, Daily, Mansy, Jan A, MD,  1 tablet at 12/03/20 0900   ondansetron (ZOFRAN) tablet 4 mg, 4 mg, Oral, Q6H PRN **OR** ondansetron (ZOFRAN) injection 4 mg, 4 mg, Intravenous, Q6H PRN, Mansy, Jan A, MD   pantoprazole (PROTONIX) injection 40 mg, 40 mg, Intravenous, BID, Piscoya, Jose, MD, 40 mg at 12/03/20 0443   traZODone (DESYREL) tablet 25 mg, 25 mg, Oral, QHS PRN, Mansy, Jan A, MD  Current Outpatient Medications:    albuterol (PROVENTIL) (2.5 MG/3ML) 0.083% nebulizer solution, Take 3 mLs (2.5 mg total) by nebulization every 6 (six) hours as needed for wheezing or shortness of breath., Disp: 75 mL, Rfl: 1   albuterol (VENTOLIN HFA) 108 (90 Base) MCG/ACT inhaler, Inhale 2 puffs into the lungs every 6 (six) hours as needed for wheezing or shortness of breath., Disp: 8 g, Rfl: 2   feeding supplement (ENSURE ENLIVE / ENSURE PLUS) LIQD, Take 237 mLs by mouth 2 (two) times daily between meals., Disp: 237 mL, Rfl: 12   mometasone-formoterol (DULERA) 100-5 MCG/ACT AERO, Inhale 2 puffs into the lungs 2 (two) times daily., Disp: 1 each, Rfl: 0   Multiple Vitamin (MULTIVITAMIN WITH MINERALS) TABS tablet, Take 1 tablet by mouth daily., Disp: 90 tablet, Rfl: 1   mupirocin ointment (BACTROBAN) 2 %, Place into the nose 2 (two) times daily., Disp: 22 g, Rfl: 0   pantoprazole (PROTONIX) 40 MG tablet, Take 1 tablet (40 mg total) by mouth 2 (two) times daily., Disp: 60 tablet, Rfl: 1   predniSONE (DELTASONE) 10 MG tablet, Take 1 tablet (10 mg total) by mouth 2 (two) times daily with a meal., Disp: 60 tablet, Rfl: 1   nicotine (NICODERM CQ - DOSED IN MG/24 HOURS) 21 mg/24hr patch, Place 1 patch (21 mg total) onto the skin daily. (Patient not taking: No sig reported), Disp: 28 patch, Rfl: 0  BP 137/89   Pulse 83   Temp 99.9 F (37.7 C) (Oral)   Resp (!) 22   Ht '5\' 9"'$  (1.753 m)   Wt 132 lb 4.4 oz (60 kg)   SpO2 97%   BMI 19.53 kg/m    Physical Exam Constitutional:      Appearance: He is ill-appearing.  HENT:     Head:     Comments:  Missing teeth Eyes:     General: No scleral icterus.    Conjunctiva/sclera: Conjunctivae normal.  Cardiovascular:     Rate and Rhythm: Normal rate and regular rhythm.  Pulmonary:     Breath sounds: Decreased breath sounds present.  Abdominal:     General: There is no distension.     Palpations: Abdomen is soft.     Tenderness: There is abdominal tenderness. There is no guarding.  Musculoskeletal:        General: No deformity.  Right lower leg: No edema.     Left lower leg: No edema.  Lymphadenopathy:     Cervical: No cervical adenopathy.  Skin:    General: Skin is warm and dry.  Neurological:     Mental Status: He is alert and oriented to person, place, and time. Mental status is at baseline.  Psychiatric:        Mood and Affect: Mood normal.        Behavior: Behavior normal.     CMP Latest Ref Rng & Units 12/03/2020  Glucose 70 - 99 mg/dL 79  BUN 6 - 20 mg/dL 20  Creatinine 0.61 - 1.24 mg/dL 0.59(L)  Sodium 135 - 145 mmol/L 128(L)  Potassium 3.5 - 5.1 mmol/L 3.0(L)  Chloride 98 - 111 mmol/L 97(L)  CO2 22 - 32 mmol/L 24  Calcium 8.9 - 10.3 mg/dL 7.3(L)  Total Protein 6.5 - 8.1 g/dL -  Total Bilirubin 0.3 - 1.2 mg/dL -  Alkaline Phos 38 - 126 U/L -  AST 15 - 41 U/L -  ALT 0 - 44 U/L -   CBC Latest Ref Rng & Units 12/03/2020  WBC 4.0 - 10.5 K/uL 1.0(LL)  Hemoglobin 13.0 - 17.0 g/dL 11.9(L)  Hematocrit 39.0 - 52.0 % 36.2(L)  Platelets 150 - 400 K/uL 181    DG Chest 2 View  Result Date: 12/02/2020 CLINICAL DATA:  sob EXAM: CHEST - 2 VIEW COMPARISON:  October 24, 2020 FINDINGS: The cardiomediastinal silhouette is unchanged in contour.Emphysematous changes. No pleural effusion. No pneumothorax. Cavitating region described on prior CT is not well visualized radiographically. There is a likely small residual cavitary lesion in the RIGHT lateral chest visualized abdomen is unremarkable. Multilevel degenerative changes of the thoracic spine. IMPRESSION: Previously seen cavitary  lesion on prior CT is not well visualized on today's radiograph. There is a small likely residual cavitary region in the RIGHT lateral chest. Electronically Signed   By: Valentino Saxon M.D.   On: 12/02/2020 18:20   CT CHEST ABDOMEN PELVIS W CONTRAST  Result Date: 12/03/2020 CLINICAL DATA:  Right-sided abdominal pain after fall. Persistent cough and shortness of breath. EXAM: CT CHEST, ABDOMEN, AND PELVIS WITH CONTRAST TECHNIQUE: Multidetector CT imaging of the chest, abdomen and pelvis was performed following the standard protocol during bolus administration of intravenous contrast. CONTRAST:  82m OMNIPAQUE IOHEXOL 350 MG/ML SOLN COMPARISON:  Thoracic and lumbar spine CT dated 12/03/2020. FINDINGS: CT CHEST FINDINGS Cardiovascular: There is no cardiomegaly or pericardial effusion. Three-vessel coronary vascular calcification. Mild atherosclerotic calcification of the thoracic aorta. No aneurysmal dilatation or dissection. The origins of the great vessels of the aortic arch appear patent as visualized. The central pulmonary arteries appear unremarkable. Mediastinum/Nodes: No hilar or mediastinal adenopathy. The esophagus is grossly unremarkable. No mediastinal fluid collection. Lungs/Pleura: Background of centrilobular emphysema. An area of scarring and cystic changes in the right middle lobe anteriorly again noted. There has been interval improvement of the consolidative area associated with this cystic changes compared to prior CT. The cluster of apparent nodular density at the left lung base likely chronic or vascular/interstitial crowding (139/3). No consolidative changes. There is no pleural effusion or pneumothorax. A 5 mm nodular density in the anterior and medial aspect of the left upper lobe (80/3), appears similar to prior CT, likely an area of scarring. The central airways are patent. Musculoskeletal: Several old right posterior rib fractures. No acute osseous pathology. CT ABDOMEN PELVIS FINDINGS  No intra-abdominal free air or free fluid. Hepatobiliary: Fatty liver.  No intrahepatic biliary ductal dilatation. Small gallstone. No pericholecystic fluid or evidence of acute cholecystitis by CT. Pancreas: Calcification or surgical material at the tip of the tail of the pancreas. The pancreas is otherwise unremarkable. Spleen: Splenectomy. Adrenals/Urinary Tract: The adrenal glands unremarkable. There is no hydronephrosis on either side. There is symmetric enhancement and excretion of contrast by both kidneys. The visualized ureters and urinary bladder appear unremarkable. Stomach/Bowel: There is a 3.7 x 4.1 cm collection containing air and fluid in the left upper abdomen under the left hemidiaphragm. This appears to communicate with the stomach. This corresponds to the previously seen surgical clips involving the stomach and most concerning for sutural dehiscence and extraluminal collection or developing abscess. Clinical correlation and surgical consult is advised. There is no bowel obstruction. There is loose stool throughout the colon compatible with diarrheal state. The appendix is normal. There is a surgical clip in the right lower quadrant adjacent to the appendix, likely a displaced surgical clip of the stomach. Vascular/Lymphatic: Moderate aortoiliac atherosclerotic disease. There is a 3 cm fusiform infrarenal abdominal aortic aneurysm. The IVC is unremarkable. No portal venous gas. There is no adenopathy. Reproductive: The prostate and seminal vesicles are grossly unremarkable. No pelvic mass Other: Midline vertical anterior abdominal wall incisional scar. Small fat containing supraumbilical ventral hernia. Musculoskeletal: No acute or significant osseous findings. IMPRESSION: 1. A 3.7 x 4.1 cm collection containing air and fluid in the left upper abdomen contiguous with the gastric fundus and corresponding to the previously seen surgical clips. Findings most concerning for sutural dehiscence and  extraluminal collection or developing abscess. Clinical correlation and surgical consult is advised. 2. Diarrheal state. No bowel obstruction. Normal appendix. 3. A 3 cm fusiform infrarenal abdominal aortic aneurysm. Recommend follow-up ultrasound every 3 years. This recommendation follows ACR consensus guidelines: White Paper of the ACR Incidental Findings Committee II on Vascular Findings. J Am Coll Radiol 2013JB:6262728. 4. Aortic Atherosclerosis (ICD10-I70.0) and Emphysema (ICD10-J43.9). Electronically Signed   By: Anner Crete M.D.   On: 12/03/2020 01:36   CT T-SPINE NO CHARGE  Result Date: 12/03/2020 CLINICAL DATA:  Fall and pain. EXAM: CT THORACIC AND LUMBAR SPINE WITHOUT CONTRAST TECHNIQUE: Multidetector CT imaging of the thoracic and lumbar spine was performed without contrast. Multiplanar CT image reconstructions were also generated. COMPARISON:  CT of the chest abdomen pelvis dated 12/03/2020. Chest CT dated 11/01/2020 and CT of the abdomen pelvis dated 10/03/2020. FINDINGS: CT THORACIC SPINE FINDINGS Alignment: Normal. Vertebrae: No acute fracture or focal pathologic process. Paraspinal and other soft tissues: Negative. Disc levels: No acute findings.  Mild degenerative changes. CT LUMBAR SPINE FINDINGS Segmentation: 5 lumbar type vertebrae. Alignment: Normal. Vertebrae: No acute fracture or focal pathologic process. Paraspinal and other soft tissues: Negative. Disc levels: No acute findings. No significant degenerative changes. IMPRESSION: No acute/traumatic thoracic or lumbar spine pathology. Electronically Signed   By: Anner Crete M.D.   On: 12/03/2020 01:23   CT L-SPINE NO CHARGE  Result Date: 12/03/2020 CLINICAL DATA:  Fall and pain. EXAM: CT THORACIC AND LUMBAR SPINE WITHOUT CONTRAST TECHNIQUE: Multidetector CT imaging of the thoracic and lumbar spine was performed without contrast. Multiplanar CT image reconstructions were also generated. COMPARISON:  CT of the chest abdomen  pelvis dated 12/03/2020. Chest CT dated 11/01/2020 and CT of the abdomen pelvis dated 10/03/2020. FINDINGS: CT THORACIC SPINE FINDINGS Alignment: Normal. Vertebrae: No acute fracture or focal pathologic process. Paraspinal and other soft tissues: Negative. Disc levels: No acute findings.  Mild degenerative changes.  CT LUMBAR SPINE FINDINGS Segmentation: 5 lumbar type vertebrae. Alignment: Normal. Vertebrae: No acute fracture or focal pathologic process. Paraspinal and other soft tissues: Negative. Disc levels: No acute findings. No significant degenerative changes. IMPRESSION: No acute/traumatic thoracic or lumbar spine pathology. Electronically Signed   By: Anner Crete M.D.   On: 12/03/2020 01:23     Assessment and plan- Patient is a 60 y.o. male with history of rheumatoid arthritis, Felty syndrome, LGL leukemia, currently admitted to the hospital for gastric perforation contained receiving conservative management.  -Immunocompromised-severe baseline neutropenia.  Less than 500.  Likely secondary to Felty syndrome.?  LGL leukemia: Status post splenectomy.  Rheumatoid arthritis and chronic prednisone use.  WBC 1.0.  We will add on differential to today's labs to evaluate neutropenia.  He typically has responded well to Granix and we will plan to restart Granix today 480 MCG and repeat daily with goal ANC > 1500.  He will likely need outpatient follow-up as well and work-up for his LGL leukemia and treatment options.  Will follow during hospitalization.   Thank you for this consultation of this pleasant patient.   Delaney Meigs, DNP [place] [facility] 12/03/2020 4:24 PM

## 2020-12-04 DIAGNOSIS — M0500 Felty's syndrome, unspecified site: Secondary | ICD-10-CM | POA: Diagnosis not present

## 2020-12-04 DIAGNOSIS — D709 Neutropenia, unspecified: Secondary | ICD-10-CM | POA: Diagnosis not present

## 2020-12-04 DIAGNOSIS — C9480 Other specified leukemias not having achieved remission: Secondary | ICD-10-CM | POA: Diagnosis not present

## 2020-12-04 DIAGNOSIS — K255 Chronic or unspecified gastric ulcer with perforation: Secondary | ICD-10-CM | POA: Diagnosis not present

## 2020-12-04 DIAGNOSIS — R5081 Fever presenting with conditions classified elsewhere: Secondary | ICD-10-CM

## 2020-12-04 LAB — CBC WITH DIFFERENTIAL/PLATELET
Abs Immature Granulocytes: 0.01 10*3/uL (ref 0.00–0.07)
Basophils Absolute: 0 10*3/uL (ref 0.0–0.1)
Basophils Relative: 1 %
Eosinophils Absolute: 0 10*3/uL (ref 0.0–0.5)
Eosinophils Relative: 0 %
HCT: 40.6 % (ref 39.0–52.0)
Hemoglobin: 13.3 g/dL (ref 13.0–17.0)
Immature Granulocytes: 1 %
Lymphocytes Relative: 44 %
Lymphs Abs: 0.7 10*3/uL (ref 0.7–4.0)
MCH: 27.7 pg (ref 26.0–34.0)
MCHC: 32.8 g/dL (ref 30.0–36.0)
MCV: 84.6 fL (ref 80.0–100.0)
Monocytes Absolute: 0.3 10*3/uL (ref 0.1–1.0)
Monocytes Relative: 18 %
Neutro Abs: 0.5 10*3/uL — ABNORMAL LOW (ref 1.7–7.7)
Neutrophils Relative %: 36 %
Platelets: 156 10*3/uL (ref 150–400)
RBC: 4.8 MIL/uL (ref 4.22–5.81)
RDW: 18.8 % — ABNORMAL HIGH (ref 11.5–15.5)
Smear Review: NORMAL
WBC: 1.5 10*3/uL — ABNORMAL LOW (ref 4.0–10.5)
nRBC: 2 % — ABNORMAL HIGH (ref 0.0–0.2)

## 2020-12-04 LAB — COMPREHENSIVE METABOLIC PANEL
ALT: 10 U/L (ref 0–44)
AST: 32 U/L (ref 15–41)
Albumin: 2 g/dL — ABNORMAL LOW (ref 3.5–5.0)
Alkaline Phosphatase: 263 U/L — ABNORMAL HIGH (ref 38–126)
Anion gap: 5 (ref 5–15)
BUN: 12 mg/dL (ref 6–20)
CO2: 25 mmol/L (ref 22–32)
Calcium: 7.7 mg/dL — ABNORMAL LOW (ref 8.9–10.3)
Chloride: 105 mmol/L (ref 98–111)
Creatinine, Ser: 0.6 mg/dL — ABNORMAL LOW (ref 0.61–1.24)
GFR, Estimated: >60 mL/min (ref >60)
Glucose, Bld: 95 mg/dL (ref 70–99)
Potassium: 3.8 mmol/L (ref 3.5–5.1)
Sodium: 135 mmol/L (ref 135–145)
Total Bilirubin: 0.9 mg/dL (ref 0.3–1.2)
Total Protein: 5.2 g/dL — ABNORMAL LOW (ref 6.5–8.1)

## 2020-12-04 LAB — MAGNESIUM: Magnesium: 1.9 mg/dL (ref 1.7–2.4)

## 2020-12-04 LAB — PATHOLOGIST SMEAR REVIEW

## 2020-12-04 MED ORDER — NICOTINE 21 MG/24HR TD PT24
21.0000 mg | MEDICATED_PATCH | Freq: Every day | TRANSDERMAL | Status: DC
  Administered 2020-12-04 – 2020-12-18 (×14): 21 mg via TRANSDERMAL
  Filled 2020-12-04 (×15): qty 1

## 2020-12-04 MED ORDER — DICLOFENAC SODIUM 1 % EX GEL
2.0000 g | Freq: Four times a day (QID) | CUTANEOUS | Status: DC
  Administered 2020-12-04 – 2020-12-09 (×5): 2 g via TOPICAL
  Filled 2020-12-04: qty 100

## 2020-12-04 NOTE — Hospital Course (Signed)
60 year old male with a known history of COPD, rheumatoid arthritis, Felty syndrome, chronic 2 L oxygen use, chronic smoker, history of MRSA pneumonia is admitted for contained gastric perforation  8/30: Oncology started Granix due to severe neutropenia

## 2020-12-04 NOTE — Progress Notes (Signed)
[facility] SURGICAL PROGRESS NOTE (cpt [phone])  Hospital Day(s): 1.   Interval History: Patient seen and examined, no acute events or new complaints overnight. Patient reports he continues to interestingly have RLQ abdominal pain which is relatively similar to his presentation. Also with subjective chills but no fever. No complaints of upper abdominal pain, nausea, emesis. He continues to be leukopenia with WBC 1.5K. Seen by hematology and restarted on Granix. Renal function remains normal with sCr - 0.60; UO - 1.5L. Previously seen hypokalemia is resolved. He continues on Cefepime and Flagyl. Strict NPO.   Review of Systems:  Constitutional: denies fever, + chills  HEENT: denies cough or congestion  Respiratory: denies any shortness of breath  Cardiovascular: denies chest pain or palpitations  Gastrointestinal: + abdominal pain, denied N/V, or diarrhea/and bowel function as per interval history Genitourinary: denies burning with urination or urinary frequency  Vital signs in last 24 hours: [min-max] current  Temp:  [97.4 F (36.3 C)-100.3 F (37.9 C)] 97.4 F (36.3 C) (08/30 0500) Pulse Rate:  [68-100] 68 (08/30 0500) Resp:  [16-27] 20 (08/30 0500) BP: (94-143)/(64-90) 102/68 (08/30 0500) SpO2:  [92 %-98 %] 96 % (08/30 0500) Weight:  [59.3 kg] 59.3 kg (08/29 2138)     Height: '5\' 10"'$  (177.8 cm) Weight: 59.3 kg BMI (Calculated): 18.76   Intake/Output last 2 shifts:  08/29 0701 - 08/30 0700 In: 1752.1 [I.V.:1152.1; IV Piggyback:600] Out: 1550 [Urine:1550]   Physical Exam:  Constitutional: alert, cooperative and no distress  HENT: normocephalic without obvious abnormality  Eyes: PERRL, EOM's grossly intact and symmetric  Respiratory: breathing non-labored at rest, on baseline 3L   Cardiovascular: regular rate and sinus rhythm  Gastrointestinal: Soft, he seems tender in RLQ most, no upper abdominal pain, non-distended, no rebound/guarding. No evidence of  peritonitis.  Musculoskeletal: no edema or wounds, motor and sensation grossly intact, NT    Labs:  CBC Latest Ref Rng & Units 12/04/2020 12/03/2020 12/03/2020  WBC 4.0 - 10.5 K/uL 1.5(L) 1.1(LL) 1.0(LL)  Hemoglobin 13.0 - 17.0 g/dL 13.3 11.9(L) 11.9(L)  Hematocrit 39.0 - 52.0 % 40.6 35.7(L) 36.2(L)  Platelets 150 - 400 K/uL 156 158 181   CMP Latest Ref Rng & Units 12/04/2020 12/03/2020 12/02/2020  Glucose 70 - 99 mg/dL 95 79 90  BUN 6 - 20 mg/dL 12 20 22(H)  Creatinine 0.61 - 1.24 mg/dL 0.60(L) 0.59(L) 0.78  Sodium 135 - 145 mmol/L 135 128(L) 127(L)  Potassium 3.5 - 5.1 mmol/L 3.8 3.0(L) 3.7  Chloride 98 - 111 mmol/L 105 97(L) 93(L)  CO2 22 - 32 mmol/L '25 24 25  '$ Calcium 8.9 - 10.3 mg/dL 7.7(L) 7.3(L) 8.1(L)  Total Protein 6.5 - 8.1 g/dL 5.2(L) - 6.5  Total Bilirubin 0.3 - 1.2 mg/dL 0.9 - 1.2  Alkaline Phos 38 - 126 U/L 263(H) - 381(H)  AST 15 - 41 U/L 32 - 51(H)  ALT 0 - 44 U/L 10 - 11     Imaging studies: No new pertinent imaging studies   Assessment/Plan: (ICD-10's: K25.5) 60 y.o. male with air/fluid collection near the gastric fundus concerning for possible contained perforation in area of recent EGD with clipping, complicated by numerous pertinent comorbidities.   - He needs to remain strict NPO - Continue IVF resuscitation - Continue IV Abx (Cefepime, Flagyl)              - Monitor abdominal examination - May need UGI reassessment; possibly Wednesday (08/31) pending clinical condition - No emergent surgical intervention; He understands  that should he clinically deteriorate, we would need to likely intervene in urgent fashion             - Okay to ambulate as tolerated             - Further management per primary service; we will follow along  All of the above findings and recommendations were discussed with the patient, and the medical team, and all of patient's questions were answered to his expressed satisfaction.  -- Edison Simon, PA-C [facility] 12/04/2020, 7:23 AM [phone] M-F: 7am - 4pm

## 2020-12-04 NOTE — TOC Initial Note (Addendum)
Transition of Care [facility]) - Initial/Assessment Note    Patient Details  Name: Marcus [name] MRN: [id number] Date of Birth: 08-May-1960  Transition of Care [facility]) CM/SW Contact:    Candie Chroman, LCSW Phone Number: 12/04/2020, 10:36 AM  Clinical Narrative:  Readmission prevention screen complete. CSW met with patient. No supports at bedside. CSW introduced role and explained that discharge planning would be discussed. PCP is Westley Chandler, FNP at [facility] in [place]. Patient drives himself to appointments. [facility]. No issues obtaining medications. Patient lives at home with his mother. No home health prior to admission. He uses a walker and bedside commode at home. He is on 3 L chronic oxygen through [place]. No further concerns. CSW encouraged patient to contact CSW as needed. CSW will continue to follow patient for support and facilitate return home when stable.                Expected Discharge Plan: Home/Self Care Barriers to Discharge: Continued Medical Work up   Patient Goals and CMS Choice        Expected Discharge Plan and Services Expected Discharge Plan: Home/Self Care       Living arrangements for the past 2 months: Single Family Home                                      Prior Living Arrangements/Services Living arrangements for the past 2 months: Single Family Home Lives with:: Parents Patient language and need for interpreter reviewed:: Yes Do you feel safe going back to the place where you live?: Yes      Need for Family Participation in Patient Care: Yes (Comment) Care giver support system in place?: Yes (comment) Current home services: DME Criminal Activity/Legal Involvement Pertinent to Current Situation/Hospitalization: No - Comment as needed  Activities of Daily Living Home Assistive Devices/Equipment: Gilford Rile (specify type) ADL Screening (condition at time of admission) Patient's cognitive ability adequate to  safely complete daily activities?: Yes Is the patient deaf or have difficulty hearing?: No Does the patient have difficulty seeing, even when wearing glasses/contacts?: No Does the patient have difficulty concentrating, remembering, or making decisions?: No Patient able to express need for assistance with ADLs?: Yes Does the patient have difficulty dressing or bathing?: No Independently performs ADLs?: Yes (appropriate for developmental age) Does the patient have difficulty walking or climbing stairs?: Yes Weakness of Legs: Both Weakness of Arms/Hands: Both  Permission Sought/Granted                  Emotional Assessment Appearance:: Appears stated age Attitude/Demeanor/Rapport: Engaged, Gracious, Lethargic Affect (typically observed): Accepting, Calm, Flat Orientation: : Oriented to Self, Oriented to Place, Oriented to  Time, Oriented to Situation Alcohol / Substance Use: Not Applicable Psych Involvement: No (comment)  Admission diagnosis:  Back pain [M54.9] 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] Intra-abdominal abscess (HCC) [K65.1] Gastric perforation (HCC) [K25.5] Abdominal pain [R10.9] Neutropenia, unspecified type [facility]) [D70.9] Patient Active Problem List   Diagnosis Date Noted   Intra-abdominal abscess ([place]) 12/03/2020   Unresponsive    SOB (shortness of breath)    Seizure-like activity ([place])    Acute respiratory failure with hypoxia ([place]) 10/30/2020   Cirrhosis of liver ([place])    GI bleed 10/01/2020   Hemorrhagic shock ([place]) 10/01/2020   Acute blood loss anemia 10/01/2020   Bleeding gastric varices    Bacteremia  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HCC)    Massive hemoptysis 09/30/2020   AKI (acute kidney injury) ([place]) 09/30/2020   Protein-calorie malnutrition, severe 09/19/2020   Lung abscess ([place]) 09/18/2020   Respiratory failure with hypoxia ([place]) 09/17/2020   Hemoptysis 09/17/2020   Acquired neutropenia ([place]) 08/28/2020   Hypothermia 08/24/2020    Cavitary pneumonia 08/22/2020   Acute on chronic respiratory failure with hypoxia ([place]) 08/22/2020   Hyponatremia 08/04/2020   Generalized weakness 08/04/2020   Dehydration 08/03/2020   Septic shock ([place]) 06/29/2020   CAP (community acquired pneumonia) 06/29/2020   COPD exacerbation ([place]) 06/11/2020   Chest pain 06/11/2020   Cellulitis 06/11/2020   Chronic diastolic CHF (congestive heart failure) ([place]) 06/11/2020   Sepsis due to undetermined organism ([place]) 01/20/2020   Acute hypoxemic respiratory failure ([place]) 01/20/2020   Hypokalemia 01/20/2020   Perforated prepyloric ulcer 01/18/2020 01/18/2020   Pneumoperitoneum 01/17/2020   COPD with acute exacerbation ([place]) 08/18/2019   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   Anemia 11/14/2015   Seropositive rheumatoid arthritis ([place])    Chronic obstructive pulmonary disease (HCC)    Moderate protein-calorie malnutrition ([place]) 07/24/2015   Pancytopenia ([place]) 07/23/2015   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   Leukopenia 10/21/2012   Axillary abscess 10/21/2012   Abnormal EKG 10/21/2012   Joint pain 10/21/2012   Splenomegaly 10/21/2012   PCP:  [name], FNP [id number] [place] [place] [address] [place]: [phone]: [phone] [address]Phone: [phone]: [phone]    Social Determinants of Health (SDOH) Interventions    Readmission Risk Interventions Readmission Risk Prevention Plan 12/04/2020 11/02/2020 09/20/2020  Transportation Screening Complete Complete Complete  PCP or Specialist Appt within 3-5 Days - - -  [place]sel[place] - - -  [place]- - -  Medication Review (RN Care Manager) Complete Complete Complete  PCP or Specialist appointment within 3-5 days of discharge  Complete Complete Complete  [place]RI or [place]- Complete Complete  SW Recovery Care/Counseling Consult Complete Complete -  Palliative Care Screening Not Applicable Not Applicable Not Applicable  Skilled Nursing Facility Not Applicable Not Applicable Not Applicable  Some recent data might be hidden

## 2020-12-04 NOTE — Progress Notes (Signed)
Marcus [name]   DOB:08-15-1960   [id number]   [id number]  Subjective: Continues to have abdominal pain but it is improved. He questions when he can eat.    Objective:  Vitals:   12/04/20 0500 12/04/20 0744  BP: 102/68 101/67  Pulse: 68 70  Resp: 20   Temp: (!) 97.4 F (36.3 C) 97.6 F (36.4 C)  SpO2: 96% 96%    Body mass index is 18.76 kg/m.  Intake/Output Summary (Last 24 hours) at 12/04/2020 1229 Last data filed at 12/04/2020 0640 Gross per 24 hour  Intake 1752.05 ml  Output 1550 ml  Net 202.05 ml   Physical Exam Constitutional:      Comments: Chronically ill appearing. Appears more comfortable than previously  Cardiovascular:     Rate and Rhythm: Normal rate and regular rhythm.  Pulmonary:     Effort: Pulmonary effort is normal.     Breath sounds: Normal breath sounds.  Skin:    General: Skin is warm.     Coloration: Skin is not jaundiced.  Neurological:     Mental Status: He is alert and oriented to person, place, and time.  Psychiatric:        Mood and Affect: Mood normal.        Behavior: Behavior normal.    Labs Lab Results  Component Value Date   WBC 1.5 (L) 12/04/2020   HGB 13.3 12/04/2020   HCT 40.6 12/04/2020   MCV 84.6 12/04/2020   PLT 156 12/04/2020   NEUTROABS 0.5 (L) 12/04/2020   CMP Latest Ref Rng & Units 12/04/2020 12/03/2020 12/02/2020  Glucose 70 - 99 mg/dL 95 79 90  BUN 6 - 20 mg/dL 12 20 22(H)  Creatinine 0.61 - 1.24 mg/dL 0.60(L) 0.59(L) 0.78  Sodium 135 - 145 mmol/L 135 128(L) 127(L)  Potassium 3.5 - 5.1 mmol/L 3.8 3.0(L) 3.7  Chloride 98 - 111 mmol/L 105 97(L) 93(L)  CO2 22 - 32 mmol/L '25 24 25  '$ Calcium 8.9 - 10.3 mg/dL 7.7(L) 7.3(L) 8.1(L)  Total Protein 6.5 - 8.1 g/dL 5.2(L) - 6.5  Total Bilirubin 0.3 - 1.2 mg/dL 0.9 - 1.2  Alkaline Phos 38 - 126 U/L 263(H) - 381(H)  AST 15 - 41 U/L 32 - 51(H)  ALT 0 - 44 U/L 10 - 11   Microbiology Recent Results (from the past 240 hour(s))  Blood culture (routine x 2)     Status: None  (Preliminary result)   Collection Time: 12/02/20  5:32 PM   Specimen: BLOOD  Result Value Ref Range Status   Specimen Description BLOOD LAC  Final   Special Requests   Final    BOTTLES DRAWN AEROBIC AND ANAEROBIC Blood Culture adequate volume   Culture   Final    NO GROWTH 2 DAYS Performed at [facility], [address], [place], [place] [address]    Report Status PENDING  Incomplete  Resp Panel by RT-PCR (Flu A&B, Covid) Nasopharyngeal Swab     Status: None   Collection Time: 12/02/20 11:35 PM   Specimen: Nasopharyngeal Swab; Nasopharyngeal(NP) swabs in vial transport medium  Result Value Ref Range Status   SARS Coronavirus 2 by RT PCR NEGATIVE NEGATIVE Final    Comment: (NOTE) SARS-CoV-2 target nucleic acids are NOT DETECTED.  The SARS-CoV-2 RNA is generally detectable in upper respiratory specimens during the acute phase of infection. The lowest concentration of SARS-CoV-2 viral copies this assay can detect is 138 copies/mL. A negative result does not preclude SARS-Cov-2 infection and  should not be used as the sole basis for treatment or other patient management decisions. A negative result may occur with  improper specimen collection/handling, submission of specimen other than nasopharyngeal swab, presence of viral mutation(s) within the areas targeted by this assay, and inadequate number of viral copies(<138 copies/mL). A negative result must be combined with clinical observations, patient history, and epidemiological information. The expected result is Negative.  Fact Sheet for Patients:  EntrepreneurPulse.com.au  Fact Sheet for Healthcare Providers:  IncredibleEmployment.be  This test is no t yet approved or cleared by the [place] FDA and  has been authorized for detection and/or diagnosis of SARS-CoV-2 by FDA under an Emergency Use Authorization (EUA). This EUA will remain  in effect (meaning this test can be used)  for the duration of the COVID-19 declaration under Section 564(b)(1) of the Act, 21 U.S.C.section 360bbb-3(b)(1), unless the authorization is terminated  or revoked sooner.       Influenza A by PCR NEGATIVE NEGATIVE Final   Influenza B by PCR NEGATIVE NEGATIVE Final    Comment: (NOTE) The Xpert Xpress SARS-CoV-2/FLU/RSV plus assay is intended as an aid in the diagnosis of influenza from Nasopharyngeal swab specimens and should not be used as a sole basis for treatment. Nasal washings and aspirates are unacceptable for Xpert Xpress SARS-CoV-2/FLU/RSV testing.  Fact Sheet for Patients: EntrepreneurPulse.com.au  Fact Sheet for Healthcare Providers: IncredibleEmployment.be  This test is not yet approved or cleared by the [place] FDA and has been authorized for detection and/or diagnosis of SARS-CoV-2 by FDA under an Emergency Use Authorization (EUA). This EUA will remain in effect (meaning this test can be used) for the duration of the COVID-19 declaration under Section 564(b)(1) of the Act, 21 U.S.C. section 360bbb-3(b)(1), unless the authorization is terminated or revoked.  Performed at [facility], [address] [place], [place] [address]   Blood culture (routine x 2)     Status: None (Preliminary result)   Collection Time: 12/02/20 11:43 PM   Specimen: BLOOD  Result Value Ref Range Status   Specimen Description BLOOD BLOOD LEFT HAND  Final   Special Requests   Final    BOTTLES DRAWN AEROBIC AND ANAEROBIC Blood Culture adequate volume   Culture   Final    NO GROWTH 2 DAYS Performed at [facility], [address], [place], [place] [address]    Report Status PENDING  Incomplete    Studies:  DG Chest 2 View  Result Date: 12/02/2020 CLINICAL DATA:  sob EXAM: CHEST - 2 VIEW COMPARISON:  October 24, 2020 FINDINGS: The cardiomediastinal silhouette is unchanged in contour.Emphysematous changes. No pleural effusion.  No pneumothorax. Cavitating region described on prior CT is not well visualized radiographically. There is a likely small residual cavitary lesion in the RIGHT lateral chest visualized abdomen is unremarkable. Multilevel degenerative changes of the thoracic spine. IMPRESSION: Previously seen cavitary lesion on prior CT is not well visualized on today's radiograph. There is a small likely residual cavitary region in the RIGHT lateral chest. Electronically Signed   By: Valentino Saxon M.D.   On: 12/02/2020 18:20   CT CHEST ABDOMEN PELVIS W CONTRAST  Result Date: 12/03/2020 CLINICAL DATA:  Right-sided abdominal pain after fall. Persistent cough and shortness of breath. EXAM: CT CHEST, ABDOMEN, AND PELVIS WITH CONTRAST TECHNIQUE: Multidetector CT imaging of the chest, abdomen and pelvis was performed following the standard protocol during bolus administration of intravenous contrast. CONTRAST:  55m OMNIPAQUE IOHEXOL 350 MG/ML SOLN COMPARISON:  Thoracic  and lumbar spine CT dated 12/03/2020. FINDINGS: CT CHEST FINDINGS Cardiovascular: There is no cardiomegaly or pericardial effusion. Three-vessel coronary vascular calcification. Mild atherosclerotic calcification of the thoracic aorta. No aneurysmal dilatation or dissection. The origins of the great vessels of the aortic arch appear patent as visualized. The central pulmonary arteries appear unremarkable. Mediastinum/Nodes: No hilar or mediastinal adenopathy. The esophagus is grossly unremarkable. No mediastinal fluid collection. Lungs/Pleura: Background of centrilobular emphysema. An area of scarring and cystic changes in the right middle lobe anteriorly again noted. There has been interval improvement of the consolidative area associated with this cystic changes compared to prior CT. The cluster of apparent nodular density at the left lung base likely chronic or vascular/interstitial crowding (139/3). No consolidative changes. There is no pleural effusion or  pneumothorax. A 5 mm nodular density in the anterior and medial aspect of the left upper lobe (80/3), appears similar to prior CT, likely an area of scarring. The central airways are patent. Musculoskeletal: Several old right posterior rib fractures. No acute osseous pathology. CT ABDOMEN PELVIS FINDINGS No intra-abdominal free air or free fluid. Hepatobiliary: Fatty liver. No intrahepatic biliary ductal dilatation. Small gallstone. No pericholecystic fluid or evidence of acute cholecystitis by CT. Pancreas: Calcification or surgical material at the tip of the tail of the pancreas. The pancreas is otherwise unremarkable. Spleen: Splenectomy. Adrenals/Urinary Tract: The adrenal glands unremarkable. There is no hydronephrosis on either side. There is symmetric enhancement and excretion of contrast by both kidneys. The visualized ureters and urinary bladder appear unremarkable. Stomach/Bowel: There is a 3.7 x 4.1 cm collection containing air and fluid in the left upper abdomen under the left hemidiaphragm. This appears to communicate with the stomach. This corresponds to the previously seen surgical clips involving the stomach and most concerning for sutural dehiscence and extraluminal collection or developing abscess. Clinical correlation and surgical consult is advised. There is no bowel obstruction. There is loose stool throughout the colon compatible with diarrheal state. The appendix is normal. There is a surgical clip in the right lower quadrant adjacent to the appendix, likely a displaced surgical clip of the stomach. Vascular/Lymphatic: Moderate aortoiliac atherosclerotic disease. There is a 3 cm fusiform infrarenal abdominal aortic aneurysm. The IVC is unremarkable. No portal venous gas. There is no adenopathy. Reproductive: The prostate and seminal vesicles are grossly unremarkable. No pelvic mass Other: Midline vertical anterior abdominal wall incisional scar. Small fat containing supraumbilical ventral  hernia. Musculoskeletal: No acute or significant osseous findings. IMPRESSION: 1. A 3.7 x 4.1 cm collection containing air and fluid in the left upper abdomen contiguous with the gastric fundus and corresponding to the previously seen surgical clips. Findings most concerning for sutural dehiscence and extraluminal collection or developing abscess. Clinical correlation and surgical consult is advised. 2. Diarrheal state. No bowel obstruction. Normal appendix. 3. A 3 cm fusiform infrarenal abdominal aortic aneurysm. Recommend follow-up ultrasound every 3 years. This recommendation follows ACR consensus guidelines: White Paper of the ACR Incidental Findings Committee II on Vascular Findings. J Am Coll Radiol 2013CJ:3944253. 4. Aortic Atherosclerosis (ICD10-I70.0) and Emphysema (ICD10-J43.9). Electronically Signed   By: Anner Crete M.D.   On: 12/03/2020 01:36   CT T-SPINE NO CHARGE  Result Date: 12/03/2020 CLINICAL DATA:  Fall and pain. EXAM: CT THORACIC AND LUMBAR SPINE WITHOUT CONTRAST TECHNIQUE: Multidetector CT imaging of the thoracic and lumbar spine was performed without contrast. Multiplanar CT image reconstructions were also generated. COMPARISON:  CT of the chest abdomen pelvis dated 12/03/2020. Chest CT dated 11/01/2020  and CT of the abdomen pelvis dated 10/03/2020. FINDINGS: CT THORACIC SPINE FINDINGS Alignment: Normal. Vertebrae: No acute fracture or focal pathologic process. Paraspinal and other soft tissues: Negative. Disc levels: No acute findings.  Mild degenerative changes. CT LUMBAR SPINE FINDINGS Segmentation: 5 lumbar type vertebrae. Alignment: Normal. Vertebrae: No acute fracture or focal pathologic process. Paraspinal and other soft tissues: Negative. Disc levels: No acute findings. No significant degenerative changes. IMPRESSION: No acute/traumatic thoracic or lumbar spine pathology. Electronically Signed   By: Anner Crete M.D.   On: 12/03/2020 01:23   CT L-SPINE NO  CHARGE  Result Date: 12/03/2020 CLINICAL DATA:  Fall and pain. EXAM: CT THORACIC AND LUMBAR SPINE WITHOUT CONTRAST TECHNIQUE: Multidetector CT imaging of the thoracic and lumbar spine was performed without contrast. Multiplanar CT image reconstructions were also generated. COMPARISON:  CT of the chest abdomen pelvis dated 12/03/2020. Chest CT dated 11/01/2020 and CT of the abdomen pelvis dated 10/03/2020. FINDINGS: CT THORACIC SPINE FINDINGS Alignment: Normal. Vertebrae: No acute fracture or focal pathologic process. Paraspinal and other soft tissues: Negative. Disc levels: No acute findings.  Mild degenerative changes. CT LUMBAR SPINE FINDINGS Segmentation: 5 lumbar type vertebrae. Alignment: Normal. Vertebrae: No acute fracture or focal pathologic process. Paraspinal and other soft tissues: Negative. Disc levels: No acute findings. No significant degenerative changes. IMPRESSION: No acute/traumatic thoracic or lumbar spine pathology. Electronically Signed   By: Anner Crete M.D.   On: 12/03/2020 01:23    Assessment: 60 y.o. 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 He started granix 480 mcg yesterday. Tolerated well. ANC has improved from 200 to 500. Goal ANC 1500. Plan to continue daily granix at this time. He will need to follow up as an outpatient.   Plan: continue granix and daily cbc with diff  Verlon Au, NP 12/04/2020  4:37 PM [facility]

## 2020-12-04 NOTE — Progress Notes (Signed)
[name] notified pt 8 beat run of SVT, Pt asymptomatic.

## 2020-12-04 NOTE — Progress Notes (Addendum)
1       [facility] [place] NAME: Marcus [name]    MR#:  [id number]  PCP: [name], FNP  DATE OF BIRTH:  Jul 20, 1960  SUBJECTIVE:  CHIEF COMPLAINT:   Chief Complaint  Patient presents with  . Shortness of Breath  Reports abdominal pain but manageable with pain medication REVIEW OF SYSTEMS:  Review of Systems  Constitutional:  Negative for diaphoresis, fever, malaise/fatigue and weight loss.  HENT:  Negative for ear discharge, ear pain, hearing loss, nosebleeds, sore throat and tinnitus.   Eyes:  Negative for blurred vision and pain.  Respiratory:  Negative for cough, hemoptysis, shortness of breath and wheezing.   Cardiovascular:  Negative for chest pain, palpitations, orthopnea and leg swelling.  Gastrointestinal:  Positive for abdominal pain. Negative for blood in stool, constipation, diarrhea, heartburn, nausea and vomiting.  Genitourinary:  Negative for dysuria, frequency and urgency.  Musculoskeletal:  Negative for back pain and myalgias.  Skin:  Negative for itching and rash.  Neurological:  Negative for dizziness, tingling, tremors, focal weakness, seizures, weakness and headaches.  Psychiatric/Behavioral:  Negative for depression. The patient is not nervous/anxious.   DRUG ALLERGIES:   Allergies  Allergen Reactions  . Levaquin [Levofloxacin In D5w] Other (See Comments)    Chest pain  . Methotrexate Other (See Comments)    Chest pain   VITALS:  Blood pressure 101/67, pulse 70, temperature 97.6 F (36.4 C), temperature source Oral, resp. rate 20, height '5\' 10"'$  (1.778 m), weight 59.3 kg, SpO2 96 %. PHYSICAL EXAMINATION:  Physical Exam 60 year old male lying in the bed comfortably without any acute distress Eyes pupil equal round reactive to light and accommodation, no scleral icterus Lungs decreased breath sounds at the bases on 3 L oxygen at baseline Cardiovascular S1-S2 normal, no murmur rales or gallop Abdomen: Soft, right lower quadrant  tenderness.  No rebound or guarding Skin no rash or lesion Neuro alert and oriented nonfocal LABORATORY PANEL:  Male CBC Recent Labs  Lab 12/04/20 0414  WBC 1.5*  HGB 13.3  HCT 40.6  PLT 156   ------------------------------------------------------------------------------------------------------------------ Chemistries  Recent Labs  Lab 12/04/20 0414  NA 135  K 3.8  CL 105  CO2 25  GLUCOSE 95  BUN 12  CREATININE 0.60*  CALCIUM 7.7*  MG 1.9  AST 32  ALT 10  ALKPHOS 263*  BILITOT 0.9   MEDICATIONS:  Scheduled Meds: . enoxaparin (LOVENOX) injection  40 mg Subcutaneous Q24H  . mometasone-formoterol  2 puff Inhalation BID  . multivitamin with minerals  1 tablet Oral Daily  . pantoprazole (PROTONIX) IV  40 mg Intravenous BID  . Tbo-filgastrim (GRANIX) SQ  480 mcg Subcutaneous Daily   Continuous Infusions: . ceFEPime (MAXIPIME) IV 200 mL/hr at 12/04/20 0640  . dextrose 5 % and 0.9 % NaCl with KCl 20 mEq/L Stopped (12/04/20 0624)  . metronidazole Stopped (12/04/20 0615)   RADIOLOGY:  No results found. ASSESSMENT AND PLAN:  60 year old male with a known history of COPD, rheumatoid arthritis, Felty syndrome, chronic 2 L oxygen use, chronic smoker, history of MRSA pneumonia is admitted for contained gastric perforation  8/30: Oncology started Granix due to severe neutropenia  Active Problems:   Intra-abdominal abscess (HCC)  Sepsis present on admission due to underlying abdominal abscess/infection Contained perforated gastric ulcer Conservative management for now as per surgical team Continue IV antibiotics and fluids with strict n.p.o.  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  Rheumatoid arthritis on chronic prednisone use S/p Granix started on 8/29 per oncology WBC 1.0->1.5  Hypokalemia Repleted and resolved  COPD Stable on Dulera and albuterol inhaler  Body mass index is 18.76 kg/m.  Net  IO Since Admission: 1,075.08 mL [12/04/20 1300]      LOS: 1 day   Consultants: Surgery Oncology    Antibiotics: Cefepime Flagyl  Status is: Inpatient  Remains inpatient appropriate because:IV treatments appropriate due to intensity of illness or inability to take PO  Dispo: The patient is from: Home              Anticipated d/c is to: Home              Patient currently is not medically stable to d/c.   Difficult to place patient No    DVT prophylaxis:       enoxaparin (LOVENOX) injection 40 mg Start: 12/03/20 0800     Family Communication: Updated [name] brother over phone   All the records are reviewed and case discussed with Nursing and [facility] team. Management plans discussed with the patient, nursing, surgery and they are in agreement.  CODE STATUS: Full Code Level of care: Med-Surg  TOTAL TIME TAKING CARE OF THIS PATIENT: 35 minutes.   More than 50% of the time was spent in counseling/coordination of care: YES  POSSIBLE D/C IN 4-5 DAYS, DEPENDING ON CLINICAL CONDITION.   Max Sane M.D on 12/04/2020 at 1:00 PM  [facility]   CC: Primary care physician; [name], FNP  Note: This dictation was prepared with Dragon dictation along with smaller phrase technology. Any transcriptional errors that result from this process are unintentional.

## 2020-12-05 LAB — CBC WITH DIFFERENTIAL/PLATELET
Abs Immature Granulocytes: 0.09 10*3/uL — ABNORMAL HIGH (ref 0.00–0.07)
Basophils Absolute: 0.1 10*3/uL (ref 0.0–0.1)
Basophils Relative: 2 %
Eosinophils Absolute: 0 10*3/uL (ref 0.0–0.5)
Eosinophils Relative: 0 %
HCT: 36.8 % — ABNORMAL LOW (ref 39.0–52.0)
Hemoglobin: 12.4 g/dL — ABNORMAL LOW (ref 13.0–17.0)
Immature Granulocytes: 2 %
Lymphocytes Relative: 17 %
Lymphs Abs: 0.6 10*3/uL — ABNORMAL LOW (ref 0.7–4.0)
MCH: 28.3 pg (ref 26.0–34.0)
MCHC: 33.7 g/dL (ref 30.0–36.0)
MCV: 84 fL (ref 80.0–100.0)
Monocytes Absolute: 0.2 10*3/uL (ref 0.1–1.0)
Monocytes Relative: 5 %
Neutro Abs: 2.7 10*3/uL (ref 1.7–7.7)
Neutrophils Relative %: 74 %
Platelets: 100 10*3/uL — ABNORMAL LOW (ref 150–400)
RBC: 4.38 MIL/uL (ref 4.22–5.81)
RDW: 18.8 % — ABNORMAL HIGH (ref 11.5–15.5)
Smear Review: NORMAL
WBC: 3.7 10*3/uL — ABNORMAL LOW (ref 4.0–10.5)
nRBC: 1.1 % — ABNORMAL HIGH (ref 0.0–0.2)

## 2020-12-05 LAB — BASIC METABOLIC PANEL
Anion gap: 5 (ref 5–15)
BUN: 10 mg/dL (ref 6–20)
CO2: 24 mmol/L (ref 22–32)
Calcium: 7.6 mg/dL — ABNORMAL LOW (ref 8.9–10.3)
Chloride: 107 mmol/L (ref 98–111)
Creatinine, Ser: 0.62 mg/dL (ref 0.61–1.24)
GFR, Estimated: >60 mL/min (ref >60)
Glucose, Bld: 83 mg/dL (ref 70–99)
Potassium: 3.9 mmol/L (ref 3.5–5.1)
Sodium: 136 mmol/L (ref 135–145)

## 2020-12-05 LAB — PHOSPHORUS: Phosphorus: 2.7 mg/dL (ref 2.5–4.6)

## 2020-12-05 LAB — MAGNESIUM: Magnesium: 1.8 mg/dL (ref 1.7–2.4)

## 2020-12-05 MED ORDER — PREDNISONE 20 MG PO TABS
10.0000 mg | ORAL_TABLET | Freq: Two times a day (BID) | ORAL | Status: DC
  Administered 2020-12-05 – 2020-12-14 (×15): 10 mg via ORAL
  Filled 2020-12-05 (×16): qty 1

## 2020-12-05 NOTE — Progress Notes (Signed)
[facility] Progress Note  Patient: Marcus [name]    [id number]  DOA: 12/02/2020     Date of Service: the patient was seen and examined on 12/05/2020  Chief Complaint  Patient presents with   Shortness of Breath   Brief hospital course: 60 year old male with a known history of COPD, rheumatoid arthritis, Felty syndrome, chronic 2 L oxygen use, chronic smoker, history of MRSA pneumonia is admitted for contained gastric perforation   8/30: Oncology started Granix due to severe neutropenia  Assessment and Plan:   Active Problems:   Intra-abdominal abscess ([place])   Sepsis present on admission due to underlying abdominal abscess/infection Contained perforated gastric ulcer Conservative management for now as per surgical team Continue IV antibiotics and fluids with strict n.p.o. General surgery consulted, recommended repeat CT scan of abdomen/pelvis with po/iv contrast tomorrow morning to re-evaluate the perforation, associated abscess and the RLQ area to make sure no other issues ongoing.  If CT scan shows improvement, then would start clear liquids.  Patient understands this plan.  For now, continue strict NPO, IV fluids, IV PPI BID, IV abx.   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  Rheumatoid arthritis on chronic prednisone use S/p Granix started on 8/29 per oncology WBC 1.0->1.5   Hypokalemia Repleted and resolved   COPD Stable on Dulera and albuterol inhaler   Body mass index is 18.76 kg/m.  Consultants: Surgery Oncology     Antibiotics: Cefepime Flagyl   Diet: NPO DVT Prophylaxis: Subcutaneous Lovenox   Advance goals of care discussion: Full code  Family Communication: family was NOT present at bedside, at the time of interview.  The pt provided permission to discuss medical plan with the family. Opportunity was given to ask question and all questions were answered satisfactorily.    Disposition:  Pt is from Home, admitted with abdominal abscess and perforation, still has perforation, which precludes a safe discharge. Discharge to Home, when when cleared by general surgery most likely after 3 to 5 days.  Subjective: No significant overnight issues, patient still complaining of generalized abdominal pain, denied any chest pain or shortness of breath, no any other active issues.  Physical Exam: General:  alert, NAD.  Appear in mild distress, affect appropriate Eyes: PERRLA ENT: Oral Mucosa Clear, moist  Neck: A JVD,  Cardiovascular: S1 and S2 Present, no Murmur,  Respiratory: good respiratory effort, Bilateral Air entry equal and Decreased, no Crackles, no wheezes Abdomen: Bowel Sound present, Soft and Generalized tenderness  Skin: No rashes Extremities: no Pedal edema, no calf tenderness Neurologic: without any new focal findings Gait not checked due to patient safety concerns  Vitals:   12/04/20 1623 12/04/20 2111 12/05/20 0405 12/05/20 0740  BP: 123/72 116/70 98/63 101/60  Pulse: 81 (!) 110 86 75  Resp: '20 20 20 '$ (!) 24  Temp: 97.9 F (36.6 C) 97.8 F (36.6 C) 97.7 F (36.5 C) 97.7 F (36.5 C)  TempSrc:  Oral Oral Oral  SpO2: 95% 92% 92% 94%  Weight:      Height:        Intake/Output Summary (Last 24 hours) at 12/05/2020 1500 Last data filed at 12/05/2020 1342 Gross per 24 hour  Intake 2349.6 ml  Output 1275 ml  Net 1074.6 ml   Filed Weights   12/02/20 1720 12/03/20 2138  Weight: 60 kg 59.3 kg    Data Reviewed: I have personally reviewed and interpreted daily labs, tele strips, imagings as discussed above.  I reviewed all nursing notes, pharmacy notes, vitals, pertinent old records I have discussed plan of care as described above with RN and patient/family.  CBC: Recent Labs  Lab 12/02/20 1732 12/03/20 0937 12/04/20 0414 12/05/20 0724  WBC 1.7* 1.1*  1.0* 1.5* 3.7*  NEUTROABS  --  0.2* 0.5* 2.7  HGB 14.5 11.9*  11.9* 13.3 12.4*   HCT 43.1 35.7*  36.2* 40.6 36.8*  MCV 85.0 83.2  83.4 84.6 84.0  PLT 163 158  181 156 [id number]*   Basic Metabolic Panel: Recent Labs  Lab 12/02/20 1732 12/03/20 0937 12/04/20 0414  NA 127* 128* 135  K 3.7 3.0* 3.8  CL 93* 97* 105  CO2 '25 24 25  '$ GLUCOSE 90 79 95  BUN 22* 20 12  CREATININE 0.78 0.59* 0.60*  CALCIUM 8.1* 7.3* 7.7*  MG  --  1.9 1.9    Studies: No results found.  Scheduled Meds:  diclofenac Sodium  2 g Topical QID   enoxaparin (LOVENOX) injection  40 mg Subcutaneous Q24H   mometasone-formoterol  2 puff Inhalation BID   multivitamin with minerals  1 tablet Oral Daily   nicotine  21 mg Transdermal Daily   pantoprazole (PROTONIX) IV  40 mg Intravenous BID   predniSONE  10 mg Oral BID   Continuous Infusions:  ceFEPime (MAXIPIME) IV 2 g (12/05/20 1341)   dextrose 5 % and 0.9 % NaCl with KCl 20 mEq/L 100 mL/hr at 12/05/20 0553   metronidazole 500 mg (12/05/20 0457)   PRN Meds: acetaminophen **OR** acetaminophen, albuterol, magnesium hydroxide, morphine injection, ondansetron **OR** ondansetron (ZOFRAN) IV, traZODone  Time spent: 35 minutes  Author: Val Riles. MD [facility] 12/05/2020 3:00 PM  To reach On-call, see care teams to locate the attending and reach out to them via [email or web address]. If 7PM-7AM, please contact night-coverage If you still have difficulty reaching the attending provider, please page the [facility] (Director on Call) for [facility] on amion for assistance.

## 2020-12-05 NOTE — Progress Notes (Signed)
[facility]ESS NOTE (cpt [phone])  Hospital Day(s): 2.   Interval History: Patient seen and examined, no acute events or new complaints overnight. Patient reports he continues to have RLQ abdominal pain this morning, some improvement. He remains without upper abdominal tenderness. He denied any fever, chills, nausea, emesis. He is anxious to eat. Leukopenia is improving; now 3.5K. No new imaging studies. He continues on Cefepime and Flagyl. Strict NPO.   Review of Systems:  Constitutional: denies fever, chills HEENT: denies cough or congestion  Respiratory: denies any shortness of breath  Cardiovascular: denies chest pain or palpitations  Gastrointestinal: + abdominal pain (RLQ, unchanged), denied N/V, or diarrhea/and bowel function as per interval history Genitourinary: denies burning with urination or urinary frequency  Vital signs in last 24 hours: [min-max] current  Temp:  [97.6 F (36.4 C)-97.9 F (36.6 C)] 97.7 F (36.5 C) (08/31 0405) Pulse Rate:  [70-110] 86 (08/31 0405) Resp:  [20] 20 (08/31 0405) BP: (98-123)/(63-72) 98/63 (08/31 0405) SpO2:  [92 %-96 %] 92 % (08/31 0405)     Height: '5\' 10"'$  (177.8 cm) Weight: 59.3 kg BMI (Calculated): 18.76   Intake/Output last 2 shifts:  08/30 0701 - 08/31 0700 In: 2349.6 [I.V.:1971.8; IV Piggyback:377.8] Out: 375 [Urine:375]   Physical Exam:  Constitutional: alert, cooperative and no distress  HENT: normocephalic without obvious abnormality  Eyes: PERRL, EOM's grossly intact and symmetric  Respiratory: breathing non-labored at rest, on baseline 3L   Cardiovascular: regular rate and sinus rhythm  Gastrointestinal: Soft, he seems tender in RLQ most, no upper abdominal pain, non-distended, no rebound/guarding. No evidence of peritonitis.  Musculoskeletal: no edema or wounds, motor and sensation grossly intact, NT    Labs:  CBC Latest Ref Rng & Units 12/04/2020 12/03/2020 12/03/2020  WBC 4.0 - 10.5 K/uL  1.5(L) 1.1(LL) 1.0(LL)  Hemoglobin 13.0 - 17.0 g/dL 13.3 11.9(L) 11.9(L)  Hematocrit 39.0 - 52.0 % 40.6 35.7(L) 36.2(L)  Platelets 150 - 400 K/uL 156 158 181   CMP Latest Ref Rng & Units 12/04/2020 12/03/2020 12/02/2020  Glucose 70 - 99 mg/dL 95 79 90  BUN 6 - 20 mg/dL 12 20 22(H)  Creatinine 0.61 - 1.24 mg/dL 0.60(L) 0.59(L) 0.78  Sodium 135 - 145 mmol/L 135 128(L) 127(L)  Potassium 3.5 - 5.1 mmol/L 3.8 3.0(L) 3.7  Chloride 98 - 111 mmol/L 105 97(L) 93(L)  CO2 22 - 32 mmol/L '25 24 25  '$ Calcium 8.9 - 10.3 mg/dL 7.7(L) 7.3(L) 8.1(L)  Total Protein 6.5 - 8.1 g/dL 5.2(L) - 6.5  Total Bilirubin 0.3 - 1.2 mg/dL 0.9 - 1.2  Alkaline Phos 38 - 126 U/L 263(H) - 381(H)  AST 15 - 41 U/L 32 - 51(H)  ALT 0 - 44 U/L 10 - 11    Imaging studies: No new pertinent imaging studies   Assessment/Plan: (ICD-10's: K25.5) 60 y.o. male with air/fluid collection near the gastric fundus concerning for possible contained perforation in area of recent EGD with clipping, complicated by numerous pertinent comorbidities.   - He needs to remain strict NPO - Continue IVF resuscitation - Continue IV Abx (Cefepime, Flagyl)              - Monitor abdominal examination - May need UGI reassessment -vs- repeat CT Abdomen/Pelvis; possibly today -vs- tomorrow  - No emergent surgical intervention; He understands that should he clinically deteriorate, we would need to likely intervene in urgent fashion             - Okay to  ambulate as tolerated             - Further management per primary service; we will follow along   All of the above findings and recommendations were discussed with the patient, and the medical team, and all of patient's questions were answered to his expressed satisfaction.    -- Edison Simon, PA-C [facility] 12/05/2020, 7:09 AM [phone] M-F: 7am - 4pm

## 2020-12-06 ENCOUNTER — Inpatient Hospital Stay: Payer: Self-pay

## 2020-12-06 ENCOUNTER — Encounter: Payer: Self-pay | Admitting: Family Medicine

## 2020-12-06 ENCOUNTER — Inpatient Hospital Stay: Payer: Medicaid Other

## 2020-12-06 LAB — CBC WITH DIFFERENTIAL/PLATELET
Abs Immature Granulocytes: 0.07 10*3/uL (ref 0.00–0.07)
Basophils Absolute: 0 10*3/uL (ref 0.0–0.1)
Basophils Relative: 0 %
Eosinophils Absolute: 0 10*3/uL (ref 0.0–0.5)
Eosinophils Relative: 0 %
HCT: 39.5 % (ref 39.0–52.0)
Hemoglobin: 13.1 g/dL (ref 13.0–17.0)
Immature Granulocytes: 1 %
Lymphocytes Relative: 9 %
Lymphs Abs: 0.6 10*3/uL — ABNORMAL LOW (ref 0.7–4.0)
MCH: 27.8 pg (ref 26.0–34.0)
MCHC: 33.2 g/dL (ref 30.0–36.0)
MCV: 83.9 fL (ref 80.0–100.0)
Monocytes Absolute: 0.4 10*3/uL (ref 0.1–1.0)
Monocytes Relative: 6 %
Neutro Abs: 5.6 10*3/uL (ref 1.7–7.7)
Neutrophils Relative %: 84 %
Platelets: 74 10*3/uL — ABNORMAL LOW (ref 150–400)
RBC: 4.71 MIL/uL (ref 4.22–5.81)
RDW: 19.2 % — ABNORMAL HIGH (ref 11.5–15.5)
WBC: 6.7 10*3/uL (ref 4.0–10.5)
nRBC: 0.9 % — ABNORMAL HIGH (ref 0.0–0.2)

## 2020-12-06 LAB — VITAMIN D 25 HYDROXY (VIT D DEFICIENCY, FRACTURES): Vit D, 25-Hydroxy: 27.67 ng/mL — ABNORMAL LOW (ref 30–100)

## 2020-12-06 LAB — BASIC METABOLIC PANEL
Anion gap: 6 (ref 5–15)
BUN: 12 mg/dL (ref 6–20)
CO2: 21 mmol/L — ABNORMAL LOW (ref 22–32)
Calcium: 7.8 mg/dL — ABNORMAL LOW (ref 8.9–10.3)
Chloride: 110 mmol/L (ref 98–111)
Creatinine, Ser: 0.64 mg/dL (ref 0.61–1.24)
GFR, Estimated: >60 mL/min (ref >60)
Glucose, Bld: 96 mg/dL (ref 70–99)
Potassium: 4.1 mmol/L (ref 3.5–5.1)
Sodium: 137 mmol/L (ref 135–145)

## 2020-12-06 LAB — MAGNESIUM: Magnesium: 2 mg/dL (ref 1.7–2.4)

## 2020-12-06 LAB — PHOSPHORUS: Phosphorus: 3.3 mg/dL (ref 2.5–4.6)

## 2020-12-06 IMAGING — CT CT ABD-PELV W/ CM
2 of 5 series · 14 of 46 positions shown, 16 images · IV contrast (APPLIED)
Comparison: [DATE]

CLINICAL DATA: Suspected perforated posterior fundal gastric ulcer
by prior imaging and reassessment for potential need for additional
surgical or percutaneous procedures.

EXAM:
CT ABDOMEN AND PELVIS WITH CONTRAST
TECHNIQUE: Multidetector CT imaging of the abdomen and pelvis was performed
using the standard protocol following bolus administration of
intravenous contrast.
CONTRAST:  75mL OMNIPAQUE IOHEXOL 350 MG/ML SOLN

[Series 2: routine abd/pel with · axial · 0.85mm/px · z∈[-980,-575]mm · 11 of 93 slices shown, 13 images]
[im 6/93  soft-tissue]
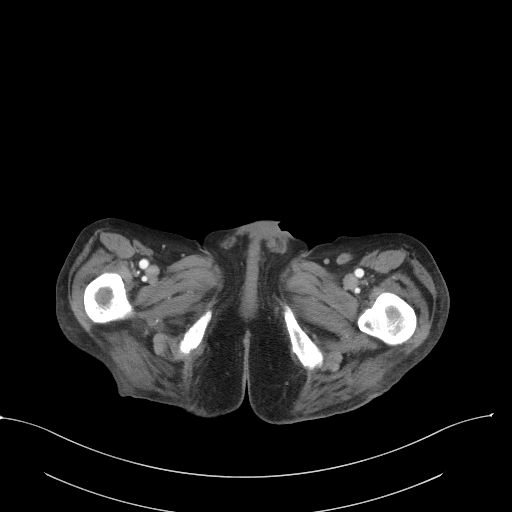
[im 6/93  bone]
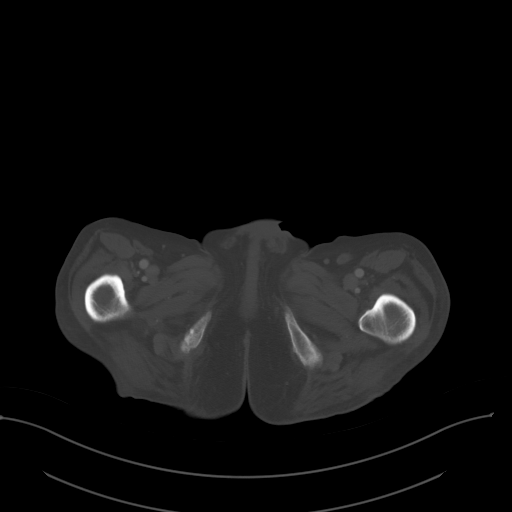
[im 16/93  soft-tissue]
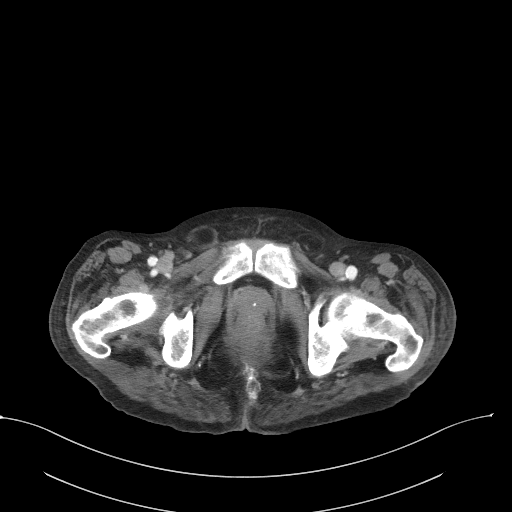
[im 21/93  soft-tissue]
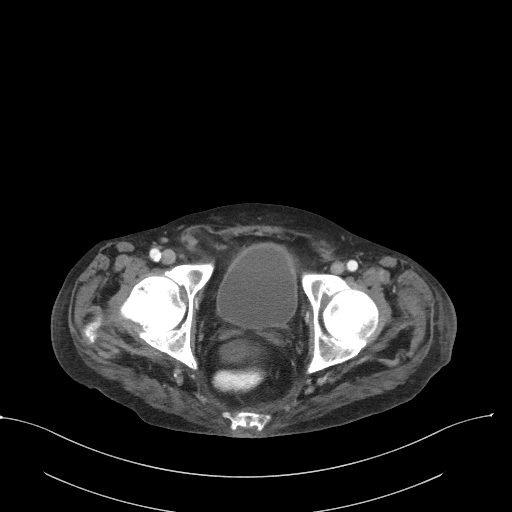
[im 31/93  soft-tissue]
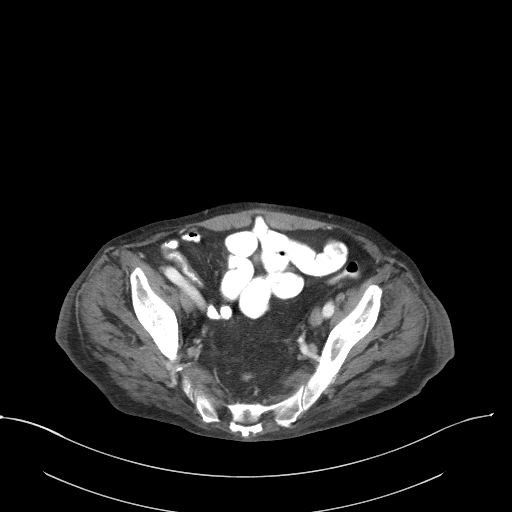
[im 36/93  soft-tissue]
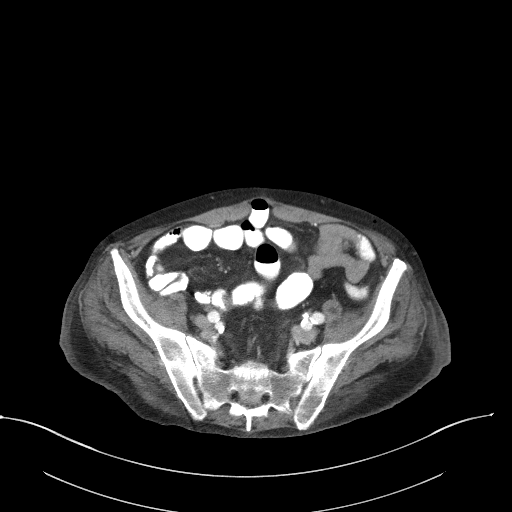
[im 47/93  soft-tissue]
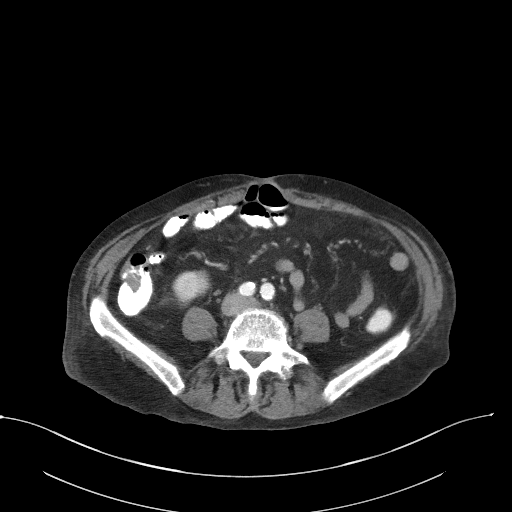
[im 57/93  soft-tissue]
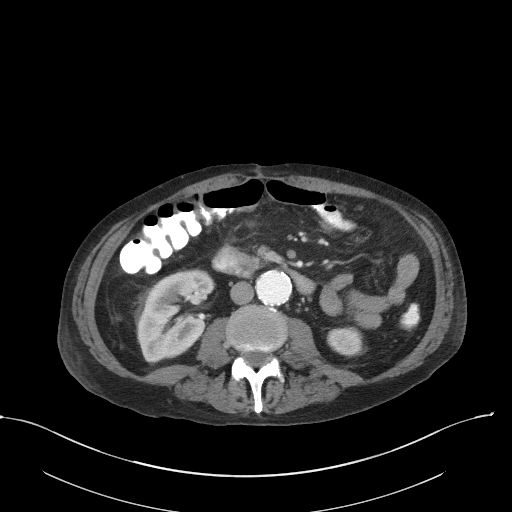
[im 62/93  soft-tissue]
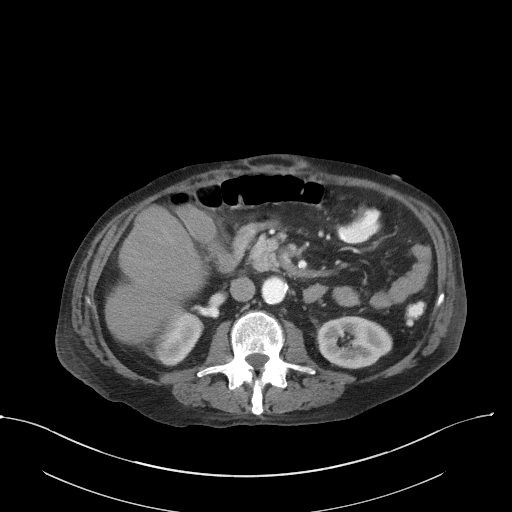
[im 72/93  soft-tissue]
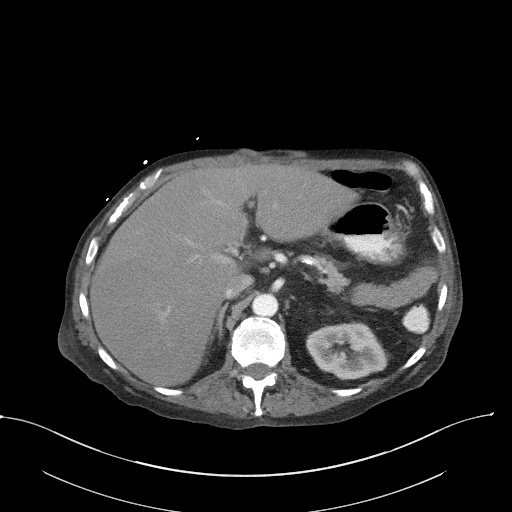
[im 72/93  bone]
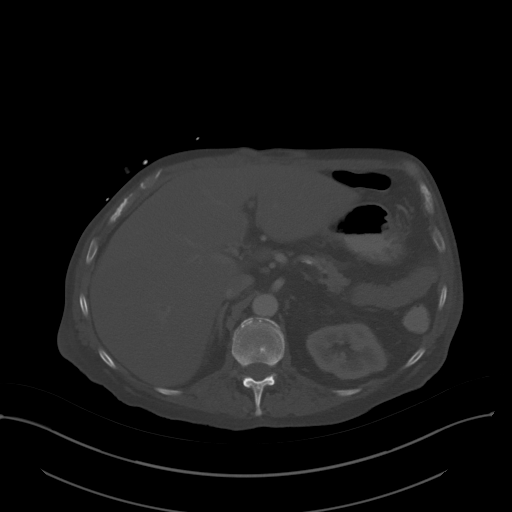
[im 77/93  soft-tissue]
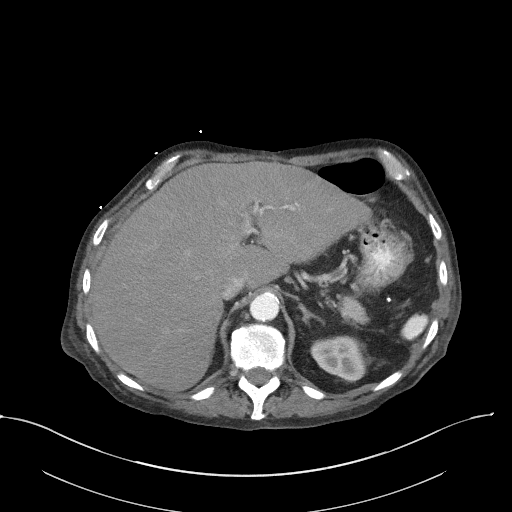
[im 87/93  soft-tissue]
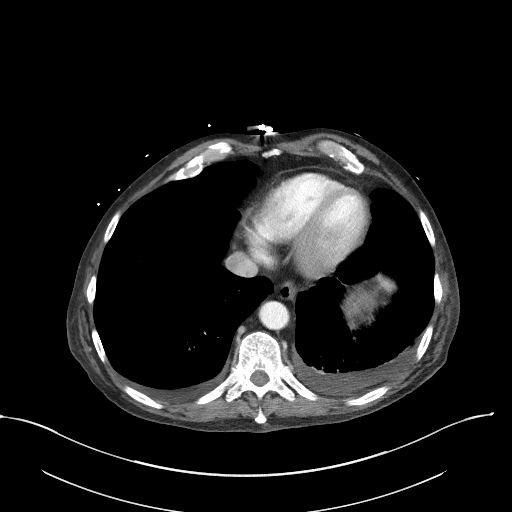

[Series 5: coronal st · coronal · 0.69mm/px · 3 of 86 slices shown]
[im 29/86  soft-tissue]
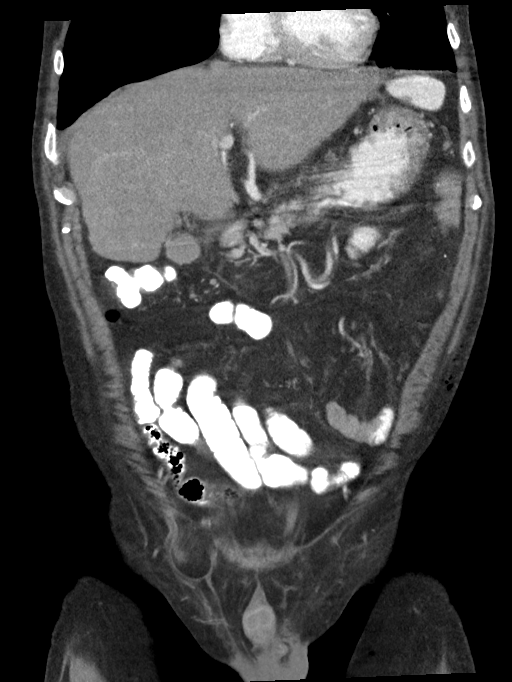
[im 38/86  soft-tissue]
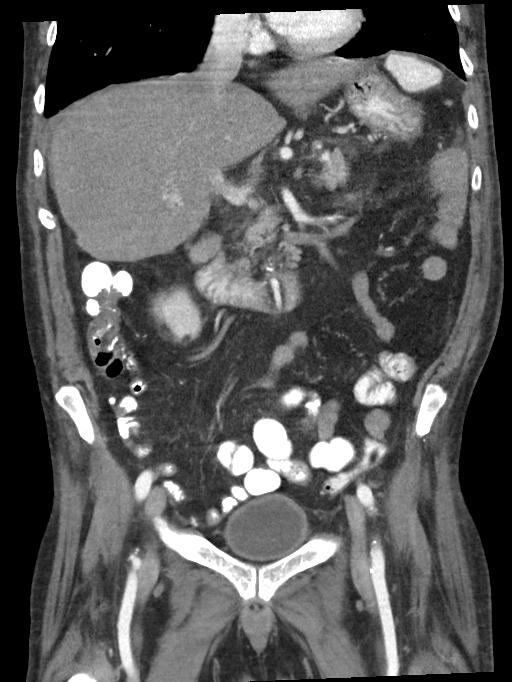
[im 48/86  soft-tissue]
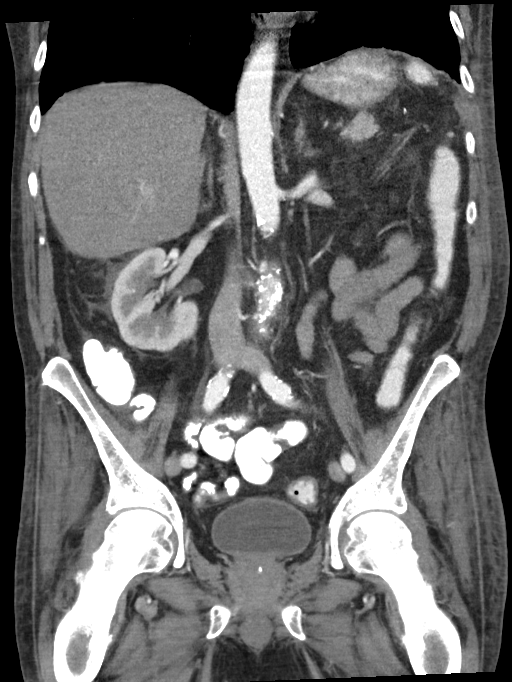

[14 of 46 positions shown; findings below may reference images not displayed]

FINDINGS: Lower chest: Development of tiny bilateral pleural effusions since
the prior CT.

Hepatobiliary: Stable appearance of the liver with likely some
degree of steatosis. The gallbladder is contracted and again
demonstrates probable calculus dependently near the gallbladder
neck. No evidence of biliary ductal dilatation.

Pancreas: Unremarkable. No pancreatic ductal dilatation or
surrounding inflammatory changes.

Spleen: Normal in size without focal abnormality.

Adrenals/Urinary Tract: Adrenal glands are unremarkable. Kidneys are
normal, without renal calculi, focal lesion, or hydronephrosis.
Bladder is unremarkable.

Stomach/Bowel: At the level of disruption of the posterior fundal
gastric wall there remains a defect with a small amount of regional
extravasated oral contrast in the subphrenic space. No further
significant air is identified and there is retraction of the
previously noted air and fluid filled collection. Only a small
amount of fluid remains between the stomach and diaphragm. Just
below the gastric defect is again noted to be metallic density
likely related to extruded previously placed endoscopic hemostatic
clip(s).

Other bowel shows no evidence of obstruction, ileus or inflammation.
Segmental narrowing of the ascending colon present over a segment
measuring approximately 4 cm with suggestion of wall thickening.
This was not as impressive on the prior CT where there was only
fluid in that segment of the colon. This may relate to peristalsis
but a lesion of the ascending colon cannot be excluded.

Vascular/Lymphatic: Stable aneurysmal disease of the infrarenal
abdominal aorta measuring approximately 2.9 x 3.1 cm in maximal
transverse diameter. No lymphadenopathy identified.

Reproductive: Prostate is unremarkable.

Other: Stable midline upper abdominal ventral hernia containing a
focal segment of the transverse colon. Stable bilateral inguinal
hernias containing fat, right greater than left. Development of a
small amount of new free fluid in the dependent pelvis.

Musculoskeletal: No acute or significant osseous findings.
IMPRESSION: 1. Improved appearance of collection of fluid and air posterior to
the gastric fundus at the level of a perforated gastric ulcer. Only
a small amount of fluid is present between the stomach and diaphragm
which includes some extravasated oral contrast. Visible disruption
of the posterior gastric wall is present at this level. Also
suspected extruded previously placed endoscopic hemostatic clip(s)
in the adjacent fat posterior to the stomach.
2. Segmental narrowing of the ascending colon with suggestion of
wall thickening. This may relate to peristalsis as this was not as
evident on the prior study but the prior study was not performed
with oral contrast. Underlying mass lesion of the ascending colon
cannot be excluded and eventual correlation with colonoscopy may be
helpful.
3. Stable infrarenal abdominal aortic aneurysm measuring 3.1 cm in
maximum diameter.
4. Stable upper abdominal ventral hernia containing focal segment of
transverse colon and bilateral inguinal hernias.

## 2020-12-06 MED ORDER — IOHEXOL 9 MG/ML PO SOLN
1000.0000 mL | Freq: Once | ORAL | Status: AC | PRN
  Administered 2020-12-06: 1000 mL via ORAL

## 2020-12-06 MED ORDER — IOHEXOL 350 MG/ML SOLN
75.0000 mL | Freq: Once | INTRAVENOUS | Status: AC | PRN
  Administered 2020-12-06: 75 mL via INTRAVENOUS

## 2020-12-06 NOTE — Progress Notes (Signed)
[facility] Progress Note  Patient: Marcus [name]    [id number]  DOA: 12/02/2020     Date of Service: the patient was seen and examined on 12/06/2020  Chief Complaint  Patient presents with   Shortness of Breath   Brief hospital course: 60 year old male with a known history of COPD, rheumatoid arthritis, Felty syndrome, chronic 2 L oxygen use, chronic smoker, history of MRSA pneumonia is admitted for contained gastric perforation   8/30: Oncology started Granix due to severe neutropenia  Assessment and Plan:   Active Problems:   Intra-abdominal abscess ([place])   Sepsis present on admission due to underlying abdominal abscess/infection Contained perforated gastric ulcer Conservative management for now as per surgical team Continue IV antibiotics and fluids with strict n.p.o. General surgery consulted, recommended repeat CT scan of abdomen/pelvis with po/iv contrast today to re-evaluate the perforation, and adjacent abscess.  If CT scan improved and without leak, then can be started on clear liquid diet.  If abscess is larger, would need to discuss with IR to see if this can be amenable for drainage. --Otherwise, continue for now NPO, IV fluids, IV abx.   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  Rheumatoid arthritis on chronic prednisone use S/p Granix started on 8/29 per oncology WBC 1.0->1.5--3.7--6.7 9/1 Leukopenia and neutropenia resolved  Hypokalemia Repleted and resolved   COPD Stable on Dulera and albuterol inhaler   Body mass index is 18.76 kg/m.  Consultants: Surgery Oncology     Antibiotics: Cefepime Flagyl   Diet: NPO DVT Prophylaxis: Subcutaneous Lovenox   Advance goals of care discussion: Full code  Family Communication: family was NOT present at bedside, at the time of interview.  The pt provided permission to discuss medical plan with the family. Opportunity was given to ask  question and all questions were answered satisfactorily.   Disposition:  Pt is from Home, admitted with abdominal abscess and perforation, still has perforation, which precludes a safe discharge. Discharge to Home, when when cleared by general surgery most likely after 3 to 5 days.  Subjective: No significant overnight issues, patient was pointing towards the right upper quadrant for pain.  Pain is not worsening it is stable.  Denies any nausea vomiting.  No any diarrhea. Denied chest pain or palpitations, no shortness of breath.   Physical Exam: General:  alert, NAD.  Appear in mild distress, affect appropriate Eyes: PERRLA ENT: Oral Mucosa Clear, moist  Neck: A JVD,  Cardiovascular: S1 and S2 Present, no Murmur,  Respiratory: good respiratory effort, Bilateral Air entry equal and Decreased, no Crackles, no wheezes Abdomen: Bowel Sound present, Soft and Generalized tenderness, more RUQ ? Skin: No rashes Extremities: no Pedal edema, no calf tenderness Neurologic: without any new focal findings Gait not checked due to patient safety concerns  Vitals:   12/05/20 2030 12/05/20 2112 12/06/20 0317 12/06/20 0729  BP:  109/72 137/85 118/85  Pulse:  74 70 68  Resp:  '20 16 18  '$ Temp:  97.7 F (36.5 C) 97.9 F (36.6 C) 98.6 F (37 C)  TempSrc:  Oral Oral   SpO2: 95% 95% 96% 99%  Weight:      Height:        Intake/Output Summary (Last 24 hours) at 12/06/2020 1157 Last data filed at 12/06/2020 0445 Gross per 24 hour  Intake 2050 ml  Output 1820 ml  Net 230 ml   Filed Weights   12/02/20 1720 12/03/20 2138  Weight:  60 kg 59.3 kg    Data Reviewed: I have personally reviewed and interpreted daily labs, tele strips, imagings as discussed above. I reviewed all nursing notes, pharmacy notes, vitals, pertinent old records I have discussed plan of care as described above with RN and patient/family.  CBC: Recent Labs  Lab 12/02/20 1732 12/03/20 0937 12/04/20 0414 12/05/20 0724  12/06/20 0450  WBC 1.7* 1.1*  1.0* 1.5* 3.7* 6.7  NEUTROABS  --  0.2* 0.5* 2.7 5.6  HGB 14.5 11.9*  11.9* 13.3 12.4* 13.1  HCT 43.1 35.7*  36.2* 40.6 36.8* 39.5  MCV 85.0 83.2  83.4 84.6 84.0 83.9  PLT 163 158  181 156 100* 74*   Basic Metabolic Panel: Recent Labs  Lab 12/02/20 1732 12/03/20 0937 12/04/20 0414 12/05/20 1518 12/06/20 0450  NA 127* 128* 135 136 137  K 3.7 3.0* 3.8 3.9 4.1  CL 93* 97* 105 107 110  CO2 '25 24 25 24 '$ 21*  GLUCOSE 90 79 95 83 96  BUN 22* '20 12 10 12  '$ CREATININE 0.78 0.59* 0.60* 0.62 0.64  CALCIUM 8.1* 7.3* 7.7* 7.6* 7.8*  MG  --  1.9 1.9 1.8 2.0  PHOS  --   --   --  2.7 3.3    Studies: No results found.  Scheduled Meds:  diclofenac Sodium  2 g Topical QID   enoxaparin (LOVENOX) injection  40 mg Subcutaneous Q24H   mometasone-formoterol  2 puff Inhalation BID   multivitamin with minerals  1 tablet Oral Daily   nicotine  21 mg Transdermal Daily   pantoprazole (PROTONIX) IV  40 mg Intravenous BID   predniSONE  10 mg Oral BID   Continuous Infusions:  ceFEPime (MAXIPIME) IV 2 g (12/06/20 0553)   dextrose 5 % and 0.9 % NaCl with KCl 20 mEq/L 100 mL/hr at 12/06/20 0550   metronidazole 500 mg (12/06/20 0445)   PRN Meds: acetaminophen **OR** acetaminophen, albuterol, magnesium hydroxide, morphine injection, ondansetron **OR** ondansetron (ZOFRAN) IV, traZODone  Time spent: 35 minutes  Author: Val Riles. MD [facility] 12/06/2020 11:57 AM  To reach On-call, see care teams to locate the attending and reach out to them via [email or web address]. If 7PM-7AM, please contact night-coverage If you still have difficulty reaching the attending provider, please page the [facility] (Director on Call) for [facility] on amion for assistance.

## 2020-12-06 NOTE — Progress Notes (Signed)
PICC order noted. Ollison RN notified that PICC would be placed 9-2 AM by [facility] IV Team.

## 2020-12-06 NOTE — Progress Notes (Signed)
12/06/2020  Subjective: No acute events overnight.  Patient denies any upper abdominal pain and only reports the same stable right lower side discomfort.  No fevers, WBC is now normalized after completion of course of Granix.  He's due for repeat CT scan today.  Vital signs: Temp:  [97.7 F (36.5 C)-98.6 F (37 C)] 98.6 F (37 C) (09/01 0729) Pulse Rate:  [68-74] 68 (09/01 0729) Resp:  [16-20] 18 (09/01 0729) BP: (109-137)/(72-85) 118/85 (09/01 0729) SpO2:  [95 %-99 %] 99 % (09/01 0729)   Intake/Output: 08/31 0701 - 09/01 0700 In: 2050 [I.V.:1850; IV Piggyback:200] Out: 2020 [Urine:2020] Last BM Date: 12/05/20  Physical Exam: Constitutional:  No acute distress Abdomen:  soft, non-distended, non-tender to palpation except discomfort in right lower side going from umbilical area and lower.  Hernias remain reducible.  No peritonitis.  Labs:  Recent Labs    12/05/20 0724 12/06/20 0450  WBC 3.7* 6.7  HGB 12.4* 13.1  HCT 36.8* 39.5  PLT 100* 74*   Recent Labs    12/05/20 1518 12/06/20 0450  NA 136 137  K 3.9 4.1  CL 107 110  CO2 24 21*  GLUCOSE 83 96  BUN 10 12  CREATININE 0.62 0.64  CALCIUM 7.6* 7.8*   No results for input(s): LABPROT, INR in the last 72 hours.  Imaging: No results found.  Assessment/Plan: This is a 60 y.o. male with contained gastric perforation  --Will obtain repeat CT scan today with po/iv contrast to evaluate his perforation and adjacent abscess.  If CT scan improved and without leak, then can be started on clear liquid diet.  If abscess is larger, would need to discuss with IR to see if this can be amenable for drainage. --Otherwise, continue for now NPO, IV fluids, IV abx. --Will continue to follow.   Melvyn Neth, [facility]

## 2020-12-06 NOTE — Progress Notes (Signed)
Pharmacy Antibiotic Note  Marcus [name] is a 60 y.o. male admitted on 12/02/2020 with intra-abdominal infection.  Pharmacy has been consulted for Cefepime dosing.  Pt has remained afebrile w/ WBC trend 1.7>1.0>1.5>3.7>6.7. Pt is immunocompromised on chronic prednisone for RA. Pt also has Felty syndrome s/p splenectomy.   Cultures have remained negative.   Plan: --Cefepime    -Will continue cefepime 2 gm q8h given current renal fxn.    --Metronidazole    -Will continue metronidazole '500mg'$  IV q12h   --Pharmacy will continue to follow and adjust dose when warranted.  Height: '5\' 10"'$  (177.8 cm) Weight: 59.3 kg (130 lb 11.7 oz) IBW/kg (Calculated) : 73  Temp (24hrs), Avg:98 F (36.7 C), Min:97.7 F (36.5 C), Max:98.6 F (37 C)  Recent Labs  Lab 12/02/20 1732 12/02/20 2330 12/03/20 0544 12/03/20 0937 12/04/20 0414 12/05/20 0724 12/05/20 1518 12/06/20 0450  WBC 1.7*  --   --  1.1*  1.0* 1.5* 3.7*  --  6.7  CREATININE 0.78  --   --  0.59* 0.60*  --  0.62 0.64  LATICACIDVEN 1.9 2.6* 1.3 0.9  --   --   --   --      Estimated Creatinine Clearance: 83.4 mL/min (by C-G formula based on SCr of 0.64 mg/dL).    Allergies  Allergen Reactions   Levaquin [Levofloxacin In D5w] Other (See Comments)    Chest pain   Methotrexate Other (See Comments)    Chest pain    Antimicrobials this admission: 8/29 Zosyn >> x 1 8/29 Cefepime >>  8/29 Flagyl >>  Microbiology results: 8/28 BCx: no growth x4 days  Thank you for allowing pharmacy to be a part of this patient's care.  Narda Rutherford, PharmD Pharmacy Resident  12/06/2020 10:16 AM

## 2020-12-07 DIAGNOSIS — K255 Chronic or unspecified gastric ulcer with perforation: Secondary | ICD-10-CM | POA: Diagnosis not present

## 2020-12-07 DIAGNOSIS — K651 Peritoneal abscess: Secondary | ICD-10-CM | POA: Diagnosis not present

## 2020-12-07 DIAGNOSIS — C9480 Other specified leukemias not having achieved remission: Secondary | ICD-10-CM | POA: Diagnosis not present

## 2020-12-07 DIAGNOSIS — D696 Thrombocytopenia, unspecified: Secondary | ICD-10-CM | POA: Diagnosis not present

## 2020-12-07 DIAGNOSIS — D709 Neutropenia, unspecified: Secondary | ICD-10-CM | POA: Diagnosis not present

## 2020-12-07 DIAGNOSIS — M0500 Felty's syndrome, unspecified site: Secondary | ICD-10-CM | POA: Diagnosis not present

## 2020-12-07 LAB — PROTIME-INR
INR: 1.7 — ABNORMAL HIGH (ref 0.8–1.2)
Prothrombin Time: 19.5 seconds — ABNORMAL HIGH (ref 11.4–15.2)

## 2020-12-07 LAB — CBC WITH DIFFERENTIAL/PLATELET
Abs Immature Granulocytes: 0.27 10*3/uL — ABNORMAL HIGH (ref 0.00–0.07)
Basophils Absolute: 0.1 10*3/uL (ref 0.0–0.1)
Basophils Relative: 2 %
Eosinophils Absolute: 0 10*3/uL (ref 0.0–0.5)
Eosinophils Relative: 0 %
HCT: 41.3 % (ref 39.0–52.0)
Hemoglobin: 13.8 g/dL (ref 13.0–17.0)
Immature Granulocytes: 6 %
Lymphocytes Relative: 13 %
Lymphs Abs: 0.6 10*3/uL — ABNORMAL LOW (ref 0.7–4.0)
MCH: 27.7 pg (ref 26.0–34.0)
MCHC: 33.4 g/dL (ref 30.0–36.0)
MCV: 82.8 fL (ref 80.0–100.0)
Monocytes Absolute: 0.5 10*3/uL (ref 0.1–1.0)
Monocytes Relative: 11 %
Neutro Abs: 3 10*3/uL (ref 1.7–7.7)
Neutrophils Relative %: 68 %
Platelets: 50 10*3/uL — ABNORMAL LOW (ref 150–400)
RBC: 4.99 MIL/uL (ref 4.22–5.81)
RDW: 19.3 % — ABNORMAL HIGH (ref 11.5–15.5)
Smear Review: NORMAL
WBC: 4.4 10*3/uL (ref 4.0–10.5)
nRBC: 1.6 % — ABNORMAL HIGH (ref 0.0–0.2)

## 2020-12-07 LAB — CULTURE, BLOOD (ROUTINE X 2)
Culture: NO GROWTH
Culture: NO GROWTH
Special Requests: ADEQUATE
Special Requests: ADEQUATE

## 2020-12-07 LAB — BASIC METABOLIC PANEL
Anion gap: 5 (ref 5–15)
BUN: 12 mg/dL (ref 6–20)
CO2: 23 mmol/L (ref 22–32)
Calcium: 8.1 mg/dL — ABNORMAL LOW (ref 8.9–10.3)
Chloride: 110 mmol/L (ref 98–111)
Creatinine, Ser: 0.51 mg/dL — ABNORMAL LOW (ref 0.61–1.24)
GFR, Estimated: >60 mL/min (ref >60)
Glucose, Bld: 120 mg/dL — ABNORMAL HIGH (ref 70–99)
Potassium: 4.5 mmol/L (ref 3.5–5.1)
Sodium: 138 mmol/L (ref 135–145)

## 2020-12-07 LAB — MAGNESIUM: Magnesium: 2.1 mg/dL (ref 1.7–2.4)

## 2020-12-07 LAB — GLUCOSE, CAPILLARY: Glucose-Capillary: 131 mg/dL — ABNORMAL HIGH (ref 70–99)

## 2020-12-07 LAB — IMMATURE PLATELET FRACTION: Immature Platelet Fraction: 13.2 % — ABNORMAL HIGH (ref 1.2–8.6)

## 2020-12-07 LAB — APTT: aPTT: 49 seconds — ABNORMAL HIGH (ref 24–36)

## 2020-12-07 LAB — PHOSPHORUS: Phosphorus: 3.7 mg/dL (ref 2.5–4.6)

## 2020-12-07 LAB — FIBRINOGEN: Fibrinogen: 64 mg/dL — CL (ref 210–475)

## 2020-12-07 MED ORDER — CHLORHEXIDINE GLUCONATE CLOTH 2 % EX PADS
6.0000 | MEDICATED_PAD | Freq: Every day | CUTANEOUS | Status: DC
  Administered 2020-12-07 – 2020-12-18 (×9): 6 via TOPICAL

## 2020-12-07 MED ORDER — SODIUM CHLORIDE 0.9 % IV SOLN
1.0000 g | Freq: Three times a day (TID) | INTRAVENOUS | Status: DC
  Administered 2020-12-08 – 2020-12-18 (×31): 1 g via INTRAVENOUS
  Filled 2020-12-07 (×35): qty 1

## 2020-12-07 MED ORDER — SODIUM CHLORIDE 0.9% FLUSH
10.0000 mL | Freq: Two times a day (BID) | INTRAVENOUS | Status: DC
  Administered 2020-12-07 – 2020-12-16 (×17): 10 mL
  Administered 2020-12-17: 20 mL
  Administered 2020-12-17 – 2020-12-18 (×3): 10 mL

## 2020-12-07 MED ORDER — SODIUM CHLORIDE 0.9 % IV SOLN: INTRAVENOUS | Status: AC

## 2020-12-07 MED ORDER — SODIUM CHLORIDE 0.9 % IV SOLN
1.0000 g | INTRAVENOUS | Status: DC
  Administered 2020-12-07: 1000 mg via INTRAVENOUS
  Filled 2020-12-07: qty 1

## 2020-12-07 MED ORDER — INSULIN ASPART 100 UNIT/ML IJ SOLN
0.0000 [IU] | Freq: Four times a day (QID) | INTRAMUSCULAR | Status: DC
  Administered 2020-12-07 – 2020-12-08 (×2): 1 [IU] via SUBCUTANEOUS
  Administered 2020-12-09 (×2): 2 [IU] via SUBCUTANEOUS
  Administered 2020-12-10 (×3): 1 [IU] via SUBCUTANEOUS
  Administered 2020-12-11 – 2020-12-12 (×4): 2 [IU] via SUBCUTANEOUS
  Administered 2020-12-12: 3 [IU] via SUBCUTANEOUS
  Administered 2020-12-12: 2 [IU] via SUBCUTANEOUS
  Administered 2020-12-12: 5 [IU] via SUBCUTANEOUS
  Administered 2020-12-13: 1 [IU] via SUBCUTANEOUS
  Administered 2020-12-13 – 2020-12-14 (×2): 2 [IU] via SUBCUTANEOUS
  Administered 2020-12-14: 1 [IU] via SUBCUTANEOUS
  Administered 2020-12-15: 2 [IU] via SUBCUTANEOUS
  Administered 2020-12-15 (×2): 1 [IU] via SUBCUTANEOUS
  Administered 2020-12-15: 2 [IU] via SUBCUTANEOUS
  Administered 2020-12-16: 1 [IU] via SUBCUTANEOUS
  Administered 2020-12-16 – 2020-12-17 (×2): 2 [IU] via SUBCUTANEOUS
  Administered 2020-12-17 (×2): 1 [IU] via SUBCUTANEOUS
  Administered 2020-12-18: 2 [IU] via SUBCUTANEOUS
  Administered 2020-12-18 (×2): 1 [IU] via SUBCUTANEOUS
  Administered 2020-12-19: 2 [IU] via SUBCUTANEOUS
  Filled 2020-12-07 (×30): qty 1

## 2020-12-07 MED ORDER — TRAVASOL 10 % IV SOLN
INTRAVENOUS | Status: AC
  Filled 2020-12-07: qty 360

## 2020-12-07 MED ORDER — SODIUM CHLORIDE 0.9% FLUSH: 10.0000 mL | INTRAVENOUS | Status: DC | PRN

## 2020-12-07 NOTE — Progress Notes (Signed)
PHARMACY - TOTAL PARENTERAL NUTRITION CONSULT NOTE   Indication: perforated gastric ulcer  Patient Measurements: Height: '5\' 10"'$  (177.8 cm) Weight: 59.3 kg (130 lb 11.7 oz) IBW/kg (Calculated) : 73 TPN AdjBW (KG): 59.3 Body mass index is 18.76 kg/m. Usual Weight:    Assessment:  admitted multiple times recently with hemoptysis and gastric bleeding s/p EGD and clipping in fundus, with cavitary pneumonia, with seizures.  Has history of drug use and was cocaine positive on one of his admissions.  Has also hx of Felty syndrome with neutropenia.  He has surgical history of splenectomy in 2017 and exploratory laparotomy with Phillip Heal patch repair of a pyloric channel perforated ulcer in 2021  Glucose / Insulin: on prednisone '10mg'$  BID for RA BG 120, no hx DM Electrolytes: WNL Renal: Scr 0.51 Hepatic: ASt/ALT 32.10  8/30 Intake / Output; MIVF: D5NS w/KCL 20 meq/L at 75 ml/hr GI Imaging:perforated gastric ulcer GI Surgeries / Procedures:  ABX: Cefepime/[place] now chg to Invanz 9/2.  ID to see.  Central access: PICC ordered for 9/2 TPN start date: 9/2 if gets PICC  Nutritional Goals: Goal TPN rate is 85 mL/hr (provides 102 g of protein and 2060 kcals per day)  RD Assessment: Estimated Needs Total Energy Estimated Needs: 1900-2200kcal/day Total Protein Estimated Needs: 95-110g/day Total Fluid Estimated Needs: 1.8-2.1L/day  Current Nutrition:  NPO  Plan:  Start TPN at 1/3 rate:  91m/hr at 1800. Pt at risk for refeeding -Nutritional components at goal rate, per Liter: Amino acids (Travasol 10%): 50grams Lipids (20% SMOF): 30 grams Dextrose: 15% kCal: 2060/ 24h -Electrolytes in TPN: Na 539m/L, K 5011mL, Ca 5mE1m, Mg 5mEq43m and Phos 15mmo64m Cl:Ac 1:1 -Add standard MVI and trace elements to TPN - Add thiamine '100mg'$  daily x 3 days(start 12/07/20) -Initiate Sensitive q6h SSI and adjust as needed. On oral prednisone, no hx DM  -Reduce MIVF to 45 mL/hr at 1800  and Change to -NS per  MD(for a total of 75 ml/hr) -Monitor TPN labs every 3 days at start then on Mon/Thurs, [id number]  Laquitta Dominski A 12/07/2020,1:48 PM

## 2020-12-07 NOTE — Progress Notes (Signed)
Initial Nutrition Assessment  DOCUMENTATION CODES:   Severe malnutrition in context of chronic illness  INTERVENTION:   TPN per pharmacy  Recommend thiamine 147m daily added to TPN x 3 days   Pt at high refeed risk; recommend monitor potassium, magnesium and phosphorus labs daily until stable  NUTRITION DIAGNOSIS:   Severe Malnutrition related to chronic illness (COPD) as evidenced by severe muscle depletion, severe fat depletion.  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 Pt is known to this RD from a recent previous admit. Pt with poor appetite and oral intake at baseline. Pt reports poor oral intake for several months pta but reports that he has not eaten anything for the past 6 days. Pt NPO in hospital. Pt reports that he is hungry today and is asking to eat. Plan is for PICC line and TPN r/t suspected prolonged NPO. RD discussed with pt the importance of adequate nutrition needed to support healing and preserve lean muscle. RD discussed TPN with pt and answered all of his questions. Pt reports that he does drink chocolate and strawberry Ensure at home; RD will add supplements with diet advancement. Pt is at high refeed risk. Per chart, pt is down 45lbs(26%) over the past year; this is significant weight loss.   Medications reviewed and include: insulin, MVI, nicotine, protonix, prednisone, NaCl _0 /hr, meropenem  Labs reviewed: K 4.5 wnl, creat 0.51(L), P 3.7 wnl, Mg 2.1 wnl  NUTRITION - FOCUSED PHYSICAL EXAM:  Flowsheet Row Most  Recent Value  Orbital Region Moderate depletion  Upper Arm Region Severe depletion  Thoracic and Lumbar Region Severe depletion  Buccal Region Moderate depletion  Temple Region Severe depletion  Clavicle Bone Region Severe depletion  Clavicle and Acromion Bone Region Severe depletion  Scapular Bone Region Severe depletion  Dorsal Hand Severe depletion  Patellar Region Severe depletion  Anterior Thigh Region Severe depletion  Posterior Calf Region Severe depletion  Edema (RD Assessment) None  Hair Reviewed  Eyes Reviewed  Mouth Reviewed  Skin Reviewed  Nails Reviewed   Diet Order:   Diet Order             Diet NPO time specified  Diet effective now                  EDUCATION NEEDS:   Education needs have been addressed  Skin:  Skin Assessment: Reviewed RN Assessment (ecchymosis)  Last BM:  9/1- type 7  Height:   Ht Readings from Last 1 Encounters:  12/03/20 _1  (1.778 m)    Weight:   Wt Readings from Last 1 Encounters:  12/07/20 58.3 kg    Ideal Body Weight:  75.45 kg  BMI:  Body mass index is 18.44 kg/m.  Estimated Nutritional Needs:   Kcal:  1900-2200kcal/day  Protein:  95-110g/day  Fluid:  1.8-2.1L/day  CKoleen DistanceMS, RD, LDN Please refer to [facility]for RD and/or RD on-call/weekend/after hours pager

## 2020-12-07 NOTE — Progress Notes (Signed)
KORREY COPPS   DOB:1960-05-10   [id number]    Subjective: No acute events overnight.  No nausea no vomiting.  Complains of abdominal pain.  No fever chills.  No bleeding.  Objective:  Vitals:   12/07/20 0455 12/07/20 0800  BP: (!) 135/94 119/85  Pulse: (!) 59 63  Resp: 20 18  Temp: 98.2 F (36.8 C) 98.2 F (36.8 C)  SpO2: 97% 97%     Intake/Output Summary (Last 24 hours) at 12/07/2020 0901 Last data filed at 12/07/2020 0537 Gross per 24 hour  Intake 0 ml  Output 2025 ml  Net -2025 ml    Physical Exam Vitals and nursing note reviewed.  Constitutional:      Comments: Patient resting in the bed comfortably.  No acute distress. 1.    HENT:     Head: Normocephalic and atraumatic.     Mouth/Throat:     Mouth: Mucous membranes are moist.     Pharynx: No oropharyngeal exudate.  Eyes:     Pupils: Pupils are equal, round, and reactive to light.  Cardiovascular:     Rate and Rhythm: Normal rate and regular rhythm.  Pulmonary:     Effort: No respiratory distress.     Breath sounds: No wheezing.     Comments: Decreased breath sounds bilaterally at bases.  No wheeze or crackles Abdominal:     General: Bowel sounds are normal. There is no distension.     Palpations: Abdomen is soft. There is no mass.     Tenderness: There is no abdominal tenderness. There is no guarding or rebound.  Musculoskeletal:        General: No tenderness. Normal range of motion.     Cervical back: Normal range of motion and neck supple.  Skin:    General: Skin is warm.  Neurological:     Mental Status: He is alert and oriented to person, place, and time.  Psychiatric:        Mood and Affect: Affect normal.        Judgment: Judgment normal.     Labs:  Lab Results  Component Value Date   WBC 4.4 12/07/2020   HGB 13.8 12/07/2020   HCT 41.3 12/07/2020   MCV 82.8 12/07/2020   PLT 50 (L) 12/07/2020   NEUTROABS 3.0 12/07/2020    Lab Results  Component Value Date   NA 138 12/07/2020   K 4.5  12/07/2020   CL 110 12/07/2020   CO2 23 12/07/2020    Studies:  CT ABDOMEN PELVIS W CONTRAST  Result Date: 12/06/2020 CLINICAL DATA:  Suspected perforated posterior fundal gastric ulcer by prior imaging and reassessment for potential need for additional surgical or percutaneous procedures. EXAM: CT ABDOMEN AND PELVIS WITH CONTRAST TECHNIQUE: Multidetector CT imaging of the abdomen and pelvis was performed using the standard protocol following bolus administration of intravenous contrast. CONTRAST:  16m OMNIPAQUE IOHEXOL 350 MG/ML SOLN COMPARISON:  12/03/2020 FINDINGS: Lower chest: Development of tiny bilateral pleural effusions since the prior CT. Hepatobiliary: Stable appearance of the liver with likely some degree of steatosis. The gallbladder is contracted and again demonstrates probable calculus dependently near the gallbladder neck. No evidence of biliary ductal dilatation. Pancreas: Unremarkable. No pancreatic ductal dilatation or surrounding inflammatory changes. Spleen: Normal in size without focal abnormality. Adrenals/Urinary Tract: Adrenal glands are unremarkable. Kidneys are normal, without renal calculi, focal lesion, or hydronephrosis. Bladder is unremarkable. Stomach/Bowel: At the level of disruption of the posterior fundal gastric wall there remains a defect  with a small amount of regional extravasated oral contrast in the subphrenic space. No further significant air is identified and there is retraction of the previously noted air and fluid filled collection. Only a small amount of fluid remains between the stomach and diaphragm. Just below the gastric defect is again noted to be metallic density likely related to extruded previously placed endoscopic hemostatic clip(s). Other bowel shows no evidence of obstruction, ileus or inflammation. Segmental narrowing of the ascending colon present over a segment measuring approximately 4 cm with suggestion of wall thickening. This was not as  impressive on the prior CT where there was only fluid in that segment of the colon. This may relate to peristalsis but a lesion of the ascending colon cannot be excluded. Vascular/Lymphatic: Stable aneurysmal disease of the infrarenal abdominal aorta measuring approximately 2.9 x 3.1 cm in maximal transverse diameter. No lymphadenopathy identified. Reproductive: Prostate is unremarkable. Other: Stable midline upper abdominal ventral hernia containing a focal segment of the transverse colon. Stable bilateral inguinal hernias containing fat, right greater than left. Development of a small amount of new free fluid in the dependent pelvis. Musculoskeletal: No acute or significant osseous findings. IMPRESSION: 1. Improved appearance of collection of fluid and air posterior to the gastric fundus at the level of a perforated gastric ulcer. Only a small amount of fluid is present between the stomach and diaphragm which includes some extravasated oral contrast. Visible disruption of the posterior gastric wall is present at this level. Also suspected extruded previously placed endoscopic hemostatic clip(s) in the adjacent fat posterior to the stomach. 2. Segmental narrowing of the ascending colon with suggestion of wall thickening. This may relate to peristalsis as this was not as evident on the prior study but the prior study was not performed with oral contrast. Underlying mass lesion of the ascending colon cannot be excluded and eventual correlation with colonoscopy may be helpful. 3. Stable infrarenal abdominal aortic aneurysm measuring 3.1 cm in maximum diameter. 4. Stable upper abdominal ventral hernia containing focal segment of transverse colon and bilateral inguinal hernias. Electronically Signed   By: Aletta Edouard M.D.   On: 12/06/2020 17:12   [place] EKG SITE RITE  Result Date: 12/06/2020 If Site Rite image not attached, placement could not be confirmed due to current cardiac rhythm.   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  Currently monitor off Granix.  Reinstitute Granix if ANC less than 1500.  #Thrombocytopenia-platelets 50.  Low clinical suspicion for HIT-admission platelets normal.   Less likely DIC/infection. Likely secondary to antibiotics/cefepime.  Recommend switching to alternate antibiotic.  Check HIT antibodies.  Hold Lovenox.  Discussed with Dr. Dwyane Dee.   #Multiple intra-abdominal abscesses-as per the primary team.discussed with Dr.Piscoya.   Cammie Sickle, MD 12/07/2020  9:01 AM

## 2020-12-07 NOTE — Progress Notes (Signed)
[facility] Progress Note  Patient: Marcus [name]    [id number]  DOA: 12/02/2020     Date of Service: the patient was seen and examined on 12/07/2020  Chief Complaint  Patient presents with   Shortness of Breath   Brief hospital course: 60 year old male with a known history of COPD, rheumatoid arthritis, Felty syndrome, chronic 2 L oxygen use, chronic smoker, history of MRSA pneumonia is admitted for contained gastric perforation   8/30: Oncology started Granix due to severe neutropenia  Assessment and Plan:   Active Problems:   Intra-abdominal abscess (HCC)   Sepsis present on admission due to underlying abdominal abscess/infection Contained perforated gastric ulcer Conservative management for now as per surgical team S/p cefepime and Flagyl, discontinued cefepime due to thrombocytopenia on 9/2 9/2 started meropenem 1 g IV every 8 hourly ID Continue IV antibiotics and fluids, keep n.p.o. General surgery consulted,  9/1 CT a/p: 1. Improved appearance of collection of fluid and air posterior to the gastric fundus at the level of a perforated gastric ulcer. Only a small amount of fluid is present between the stomach and diaphragmwhich includes some extravasated oral contrast. Visible disruption of the posterior gastric wall is present at this level. Also suspected extruded previously placed endoscopic hemostatic clip(s) in the adjacent fat posterior to the stomach. 2. Segmental narrowing of the ascending colon with suggestion of wall thickening. This may relate to peristalsis as this was not as evident on the prior study but the prior study was not performed with oral contrast. Underlying mass lesion of the ascending colon cannot be excluded and eventual correlation with colonoscopy may be helpful. 3. Stable infrarenal abdominal aortic aneurysm measuring 3.1 cm in maximum diameter. 4. Stable upper abdominal ventral hernia containing focal segment of transverse colon and  bilateral inguinal hernias. General surgery recommended conservative management, repeat CT abdomen pelvis on Monday continue for now NPO, IV fluids, IV abx. 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  Consultants: Surgery Oncology Infectious disease     Antibiotics: Cefepime d/c'd on 9/2 Flagyl d/c'd on 9/2 Meropenem 9/2---   Diet: NPO DVT Prophylaxis: Subcutaneous Lovenox   Advance goals of care discussion: Full code  Family Communication: family was NOT present at bedside, at the time of interview.  The pt provided permission to discuss medical plan with the family. Opportunity was given to ask question and all questions were answered satisfactorily.   Disposition:  Pt is from Home, admitted with abdominal abscess and perforation, still has perforation, which precludes a safe discharge. Discharge to Home, when when cleared by general surgery most likely after 3 to 5 days.  Subjective: No significant overnight issues, patient was pointing towards the right upper quadrant for pain.  Pain is not worsening it is stable.  Denies any nausea vomiting.  No any diarrhea. Denied chest pain or palpitations, no shortness of breath.   Physical Exam: General:  alert, NAD.  Appear in mild distress, affect appropriate Eyes: PERRLA ENT: Oral Mucosa Clear, moist  Neck: A JVD,  Cardiovascular: S1 and S2 Present, no  Murmur,  Respiratory: good respiratory effort, Bilateral Air entry equal and Decreased, no Crackles, no wheezes Abdomen: Bowel Sound present, Soft and Generalized tenderness, more RUQ ? Skin: No rashes Extremities: no Pedal edema, no calf tenderness Neurologic: without any new focal findings Gait not checked due to patient safety concerns  Vitals:   12/07/20 0455 12/07/20 0800 12/07/20 1541 12/07/20 1634  BP: (!) 135/94 119/85 (!) 145/91   Pulse: (!) 59 63 (!) 57   Resp: '20 18 18   '$ Temp: 98.2 F (36.8 C) 98.2 F (36.8 C) (!) 97.5 F (36.4 C)   TempSrc: Oral Oral Oral   SpO2: 97% 97% 100%   Weight:    58.3 kg  Height:        Intake/Output Summary (Last 24 hours) at 12/07/2020 1651 Last data filed at 12/07/2020 1608 Gross per 24 hour  Intake 3429.13 ml  Output 2075 ml  Net 1354.13 ml   Filed Weights   12/02/20 1720 12/03/20 2138 12/07/20 1634  Weight: 60 kg 59.3 kg 58.3 kg    Data Reviewed: I have personally reviewed and interpreted daily labs, tele strips, imagings as discussed above. I reviewed all nursing notes, pharmacy notes, vitals, pertinent old records I have discussed plan of care as described above with RN and patient/family.  CBC: Recent Labs  Lab 12/03/20 0937 12/04/20 0414 12/05/20 0724 12/06/20 0450 12/07/20 0440  WBC 1.1*  1.0* 1.5* 3.7* 6.7 4.4  NEUTROABS 0.2* 0.5* 2.7 5.6 3.0  HGB 11.9*  11.9* 13.3 12.4* 13.1 13.8  HCT 35.7*  36.2* 40.6 36.8* 39.5 41.3  MCV 83.2  83.4 84.6 84.0 83.9 82.8  PLT 158  181 156 100* 74* 50*   Basic Metabolic Panel: Recent Labs  Lab 12/03/20 0937 12/04/20 0414 12/05/20 1518 12/06/20 0450 12/07/20 0440  NA 128* 135 136 137 138  K 3.0* 3.8 3.9 4.1 4.5  CL 97* 105 107 110 110  CO2 '24 25 24 '$ 21* 23  GLUCOSE 79 95 83 96 120*  BUN '20 12 10 12 12  '$ CREATININE 0.59* 0.60* 0.62 0.64 0.51*  CALCIUM 7.3* 7.7* 7.6* 7.8* 8.1*  MG 1.9 1.9 1.8 2.0 2.1  PHOS  --   --  2.7 3.3 3.7    Studies: [place] EKG SITE  RITE  Result Date: 12/06/2020 If Site Rite image not attached, placement could not be confirmed due to current cardiac rhythm.   Scheduled Meds:  Chlorhexidine Gluconate Cloth  6 each Topical Daily   diclofenac Sodium  2 g Topical QID   [START ON 12/08/2020] insulin aspart  0-9 Units Subcutaneous Q6H   mometasone-formoterol  2 puff Inhalation BID   multivitamin with minerals  1 tablet Oral Daily   nicotine  21 mg Transdermal Daily   pantoprazole (PROTONIX) IV  40 mg Intravenous BID   predniSONE  10 mg Oral BID   sodium chloride flush  10-40 mL Intracatheter Q12H   Continuous Infusions:  sodium chloride     dextrose 5 % and 0.9 % NaCl with KCl 20 mEq/L 75 mL/hr at 12/07/20 1511   [START ON 12/08/2020] meropenem (MERREM) IV     TPN ADULT (ION)     PRN Meds: acetaminophen **OR** acetaminophen, albuterol, magnesium hydroxide, morphine injection, ondansetron **OR** ondansetron (ZOFRAN) IV, sodium chloride flush, traZODone  Time spent: 35 minutes  Author: Val Riles. MD [facility] 12/07/2020 4:51 PM  To reach On-call, see care teams to locate  the attending and reach out to them via [email or web address]. If 7PM-7AM, please contact night-coverage If you still have difficulty reaching the attending provider, please page the [facility] (Director on Call) for [facility] on amion for assistance.

## 2020-12-07 NOTE — Progress Notes (Signed)
Pharmacy Antibiotic Note  Marcus [name] is a 60 y.o. male admitted on 12/02/2020 with intra-abdominal infection./abscess.  Pharmacy has been consulted for Cefepime dosing.  Pt has remained afebrile w/ WBC trend 1.7>1.0>1.5>3.7>6.7. Pt is immunocompromised on chronic prednisone for RA. Pt also has Felty syndrome s/p splenectomy.   Cultures have remained negative.   Plan: --Cefepime    -Will continue cefepime 2 gm q8h given current renal fxn.    --Metronidazole    -Will continue metronidazole '500mg'$  IV q12h   --Pharmacy will continue to follow and adjust dose when warranted.  Height: '5\' 10"'$  (177.8 cm) Weight: 59.3 kg (130 lb 11.7 oz) IBW/kg (Calculated) : 73  Temp (24hrs), Avg:98.3 F (36.8 C), Min:98 F (36.7 C), Max:98.6 F (37 C)  Recent Labs  Lab 12/02/20 1732 12/02/20 2330 12/03/20 0544 12/03/20 0937 12/04/20 0414 12/05/20 0724 12/05/20 1518 12/06/20 0450 12/07/20 0440  WBC 1.7*  --   --  1.1*  1.0* 1.5* 3.7*  --  6.7 4.4  CREATININE 0.78  --   --  0.59* 0.60*  --  0.62 0.64 0.51*  LATICACIDVEN 1.9 2.6* 1.3 0.9  --   --   --   --   --      Estimated Creatinine Clearance: 83.4 mL/min (A) (by C-G formula based on SCr of 0.51 mg/dL (L)).    Allergies  Allergen Reactions   Levaquin [Levofloxacin In D5w] Other (See Comments)    Chest pain   Methotrexate Other (See Comments)    Chest pain    Antimicrobials this admission: 8/29 Zosyn >> x 1 8/29 Cefepime >>  8/29 Flagyl >>  Microbiology results: 8/28 BCx: no growth x4 days  Thank you for allowing pharmacy to be a part of this patient's care.  Ramone Gander A, PharmD 12/07/2020 8:24 AM

## 2020-12-07 NOTE — Progress Notes (Signed)
Peripherally Inserted Central Catheter Placement  The IV Nurse has discussed with the patient and/or persons authorized to consent for the patient, the purpose of this procedure and the potential benefits and risks involved with this procedure.  The benefits include less needle sticks, lab draws from the catheter, and the patient may be discharged home with the catheter. Risks include, but not limited to, infection, bleeding, blood clot (thrombus formation), and puncture of an artery; nerve damage and irregular heartbeat and possibility to perform a PICC exchange if needed/ordered by physician.  Alternatives to this procedure were also discussed.  Bard Power PICC patient education guide, fact sheet on infection prevention and patient information card has been provided to patient /or left at bedside.    PICC Placement Documentation  PICC Double Lumen 12/07/20 PICC Right Brachial 40 cm 0 cm (Active)  Indication for Insertion or Continuance of Line Administration of hyperosmolar/irritating solutions (i.e. TPN, Vancomycin, etc.) 12/07/20 1412  Exposed Catheter (cm) 0 cm 12/07/20 1412  Site Assessment Clean;Dry;Intact 12/07/20 1412  Lumen #1 Status Flushed;Blood return noted;Saline locked 12/07/20 1412  Lumen #2 Status Flushed;Blood return noted;Saline locked 12/07/20 1412  Dressing Type Transparent 12/07/20 1412  Dressing Status Clean;Dry;Intact 12/07/20 1412  Antimicrobial disc in place? Yes 12/07/20 1412  Dressing Change Due 12/14/20 12/07/20 1412       Scotty Court 12/07/2020, 2:15 PM

## 2020-12-07 NOTE — Progress Notes (Signed)
12/07/2020  Subjective: No acute events overnight.  Repeat CT scan was obtained yesterday and this showed improvement in the air/fluid collection and perforation, but still with some contrast extravasation.  WBC remains normal now aft Granix treatment, but his platelets have been dropping as well.  Patient denies any worsening pain.  Vital signs: Temp:  [98 F (36.7 C)-98.6 F (37 C)] 98.2 F (36.8 C) (09/02 0800) Pulse Rate:  [59-65] 63 (09/02 0800) Resp:  [18-20] 18 (09/02 0800) BP: (119-142)/(85-94) 119/85 (09/02 0800) SpO2:  [97 %-100 %] 97 % (09/02 0800)   Intake/Output: 09/01 0701 - 09/02 0700 In: 0  Out: 2025 [Urine:2025] Last BM Date: 12/06/20  Physical Exam: Constitutional: No acute distress Abdomen:  soft, non-distended, nontender to palpation.  Stable reducible right inguinal hernia, umbilical hernia, and epigastric hernia without tenderness.  Labs:  Recent Labs    12/06/20 0450 12/07/20 0440  WBC 6.7 4.4  HGB 13.1 13.8  HCT 39.5 41.3  PLT 74* 50*   Recent Labs    12/06/20 0450 12/07/20 0440  NA 137 138  K 4.1 4.5  CL 110 110  CO2 21* 23  GLUCOSE 96 120*  BUN 12 12  CREATININE 0.64 0.51*  CALCIUM 7.8* 8.1*   No results for input(s): LABPROT, INR in the last 72 hours.  Imaging: CT ABDOMEN PELVIS W CONTRAST  Result Date: 12/06/2020 CLINICAL DATA:  Suspected perforated posterior fundal gastric ulcer by prior imaging and reassessment for potential need for additional surgical or percutaneous procedures. EXAM: CT ABDOMEN AND PELVIS WITH CONTRAST TECHNIQUE: Multidetector CT imaging of the abdomen and pelvis was performed using the standard protocol following bolus administration of intravenous contrast. CONTRAST:  72m OMNIPAQUE IOHEXOL 350 MG/ML SOLN COMPARISON:  12/03/2020 FINDINGS: Lower chest: Development of tiny bilateral pleural effusions since the prior CT. Hepatobiliary: Stable appearance of the liver with likely some degree of steatosis. The gallbladder  is contracted and again demonstrates probable calculus dependently near the gallbladder neck. No evidence of biliary ductal dilatation. Pancreas: Unremarkable. No pancreatic ductal dilatation or surrounding inflammatory changes. Spleen: Normal in size without focal abnormality. Adrenals/Urinary Tract: Adrenal glands are unremarkable. Kidneys are normal, without renal calculi, focal lesion, or hydronephrosis. Bladder is unremarkable. Stomach/Bowel: At the level of disruption of the posterior fundal gastric wall there remains a defect with a small amount of regional extravasated oral contrast in the subphrenic space. No further significant air is identified and there is retraction of the previously noted air and fluid filled collection. Only a small amount of fluid remains between the stomach and diaphragm. Just below the gastric defect is again noted to be metallic density likely related to extruded previously placed endoscopic hemostatic clip(s). Other bowel shows no evidence of obstruction, ileus or inflammation. Segmental narrowing of the ascending colon present over a segment measuring approximately 4 cm with suggestion of wall thickening. This was not as impressive on the prior CT where there was only fluid in that segment of the colon. This may relate to peristalsis but a lesion of the ascending colon cannot be excluded. Vascular/Lymphatic: Stable aneurysmal disease of the infrarenal abdominal aorta measuring approximately 2.9 x 3.1 cm in maximal transverse diameter. No lymphadenopathy identified. Reproductive: Prostate is unremarkable. Other: Stable midline upper abdominal ventral hernia containing a focal segment of the transverse colon. Stable bilateral inguinal hernias containing fat, right greater than left. Development of a small amount of new free fluid in the dependent pelvis. Musculoskeletal: No acute or significant osseous findings. IMPRESSION: 1. Improved  appearance of collection of fluid and air  posterior to the gastric fundus at the level of a perforated gastric ulcer. Only a small amount of fluid is present between the stomach and diaphragm which includes some extravasated oral contrast. Visible disruption of the posterior gastric wall is present at this level. Also suspected extruded previously placed endoscopic hemostatic clip(s) in the adjacent fat posterior to the stomach. 2. Segmental narrowing of the ascending colon with suggestion of wall thickening. This may relate to peristalsis as this was not as evident on the prior study but the prior study was not performed with oral contrast. Underlying mass lesion of the ascending colon cannot be excluded and eventual correlation with colonoscopy may be helpful. 3. Stable infrarenal abdominal aortic aneurysm measuring 3.1 cm in maximum diameter. 4. Stable upper abdominal ventral hernia containing focal segment of transverse colon and bilateral inguinal hernias. Electronically Signed   By: Aletta Edouard M.D.   On: 12/06/2020 17:12   [place] EKG SITE RITE  Result Date: 12/06/2020 If Site Rite image not attached, placement could not be confirmed due to current cardiac rhythm.   Assessment/Plan: This is a 60 y.o. male with proximal gastric perforation, improved, but still with some contrast extravasation.  --Discussed with the patient last night and again this morning about the need to keep him NPO still.  If we were to feed him, the perforation could get worse or the leak could get worse and he could get sicker.  I would still want to try conservative measures given his COPD and other comorbidities.  Now that his neutropenia is better after Granix, perhaps he will be able to mount the necessary healing response for the perforation.  At this moment, it is contained and there is no emergent surgical need.  Although frustrated, the patient understands this and is in agreement. --Will order PICC line and TPN to start today for IV nutrition --Discussed with  Dr. Dwyane Dee and Dr. Rogue Bussing about his platelet decreasing trend. --Anticipate repeat CT scan on Monday to re-evaluate.  If no improvement, then discussed with patient that would then have to proceed with surgery instead.   Melvyn Neth, [facility]

## 2020-12-07 NOTE — Consult Note (Signed)
NAME: Marcus [name]  DOB: 03-Mar-1961  MRN: [id number]  Date/Time: 12/07/2020 3:48 PM  REQUESTING PROVIDER: Ardine Bjork Subjective:  REASON FOR CONSULT: gastric perforation/abscess/ antibiotic management ? Marcus [name] is a 60 y.o. with a history of  rheumatoid arthritis, Felty syndrome, COPD, MRSA necrotizing pneumonia Lungs - Normal respiratory effort, chest expands symmetrically. Lungs are clear to auscultation, no crackles or wheezes. 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 blood culture sent CT abdomen showed  a 3.7 x 4.1 cm collection containing air and fluid in the left upper abdomen under the left hemidiaphragm. This appears to communicate with the stomach. This corresponds to the previously seen surgical clips involving the stomach and most concerning for sutural dehiscence and extraluminal collection or developing abscess He was started on cefepime and flagyl Surgery consulted and as the gastric perforation was contained he was managed  conservatively with NPO and IV antibiotics A repeat CT done on 12/06/20 showed improvement in the fluid collection. His platelet started to drop and heme onc consulted and they recommend changing cefepime because of possible antibiotic related thrombocytopenia. Pt was started on TPN today and the plan is to repeat CT on Monday / I am seeing the patient for antibiotic .   Patient has complicated medical history. Multiple admissions this year 08/22/2020 until 08/30/2020 left upper lobe MRSA pneumonia with abscess formation.  After initial IV antibiotics was switched to doxycycline for 4 to 6 weeks  and was discharged home.  09/17/2020 until 09/28/2020: Readmitted for hemoptysis -MRSA cavitating pneumonia with hemoptysis.  Started on linezolid   6/26 until 10/07/2020 was readmitted to [facility] then transferred to [facility] health  for hemoptysis/hematemesis.  He also had Klebsiella bacteremia which was treated.  An angiogram was done and there was no bleeding vessel for embolization.  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  He also received octreotide infusion. He was sent home on doxycycline and Flagyl to complete the course for the cavitary pneumonia for 19 more days.. 7/26-7/30 admitted for agonal breathing seizure like activity and was intubated for a short time- Also had cocaine in the urine.  Past Medical History:  Diagnosis Date   COPD (chronic obstructive pulmonary disease) (HCC)    Felty syndrome (HCC)    Hernia, epigastric    Pancytopenia (HCC)    Seropositive rheumatoid arthritis (HCC)    Tobacco use disorder     Past Surgical History:  Procedure Laterality Date   ESOPHAGOGASTRODUODENOSCOPY (EGD) WITH PROPOFOL N/A 10/01/2020   Procedure: ESOPHAGOGASTRODUODENOSCOPY (EGD) WITH PROPOFOL;  Surgeon: Mauri Pole, MD;  Location: [facility];  Service: Endoscopy;  Laterality: N/A;   HEMOSTASIS CLIP PLACEMENT  10/01/2020   Procedure: HEMOSTASIS CLIP PLACEMENT;  Surgeon: Mauri Pole, MD;  Location: [facility];  Service: Endoscopy;;   INCISIONAL HERNIA REPAIR  01/17/2020   Procedure: HERNIA REPAIR INCISIONAL AND Silvestre Gunner;  Surgeon: Kinsinger, Arta Bruce, MD;  Location: [facility];  Service: General;;   IR ANGIOGRAM SELECTIVE EACH ADDITIONAL VESSEL  10/01/2020  IR ANGIOGRAM SELECTIVE EACH ADDITIONAL VESSEL  10/01/2020   IR ANGIOGRAM VISCERAL SELECTIVE  10/01/2020   IR ANGIOGRAM VISCERAL SELECTIVE  10/01/2020   IR US GUIDE VASC ACCESS RIGHT  10/01/2020   LAPAROTOMY N/A 01/17/2020    Procedure: EXPLORATORY LAPAROTOMY;  Surgeon: Kieth Brightly Arta Bruce, MD;  Location: [facility];  Service: General;  Laterality: N/A;    Social History   Socioeconomic History   Marital status: Single    Spouse name: Not on file   Number of children: Not on file   Years of education: Not on file   Highest education level: Not on file  Occupational History   Not on file  Tobacco Use   Smoking status: Every Day    Packs/day: 1.00    Types: Cigarettes   Smokeless tobacco: Never  Substance and Sexual Activity   Alcohol use: No    Alcohol/week: 0.0 standard drinks   Drug use: Yes    Types: Marijuana   Sexual activity: Not on file  Other Topics Concern   Not on file  Social History Narrative   Lives at home with Mother. Ambulates independently.   Social Determinants of Health   Financial Resource Strain: Not on file  Food Insecurity: Not on file  Transportation Needs: Not on file  Physical Activity: Not on file  Stress: Not on file  Social Connections: Not on file  Intimate Partner Violence: Not on file    Family History  Problem Relation Age of Onset   CAD Brother    COPD Brother    Allergies  Allergen Reactions   Levaquin [Levofloxacin In D5w] Other (See Comments)    Chest pain   Methotrexate Other (See Comments)    Chest pain   I? Current Facility-Administered Medications  Medication Dose Route Frequency Provider Last Rate Last Admin   0.9 %  sodium chloride infusion   Intravenous Continuous Piscoya, Jose, MD       acetaminophen (TYLENOL) tablet 650 mg  650 mg Oral Q6H PRN Mansy, Jan A, MD   650 mg at 12/03/20 2058   Or   acetaminophen (TYLENOL) suppository 650 mg  650 mg Rectal Q6H PRN Mansy, Jan A, MD       albuterol (PROVENTIL) (2.5 MG/3ML) 0.083% nebulizer solution 2.5 mg  2.5 mg Nebulization Q6H PRN Mansy, Jan A, MD       Chlorhexidine Gluconate Cloth 2 % PADS 6 each  6 each Topical Daily Dwyane Dee, Dileep, MD       dextrose 5 % and 0.9 % NaCl with KCl 20 mEq/L  infusion   Intravenous Continuous Piscoya, Jose, MD 75 mL/hr at 12/07/20 1511 New Bag at 12/07/20 1511   diclofenac Sodium (VOLTAREN) 1 % topical gel 2 g  2 g Topical QID Max Sane, MD   2 g at 12/04/20 2115   ertapenem [facility]) 1,000 mg in sodium chloride 0.9 % 100 mL IVPB  1 g Intravenous Q24H Val Riles, MD 200 mL/hr at 12/07/20 1514 1,000 mg at 12/07/20 1514   [START ON 12/08/2020] insulin aspart (novoLOG) injection 0-9 Units  0-9 Units Subcutaneous Q6H Chinita Greenland A, RPH       magnesium hydroxide (MILK OF MAGNESIA) suspension 30 mL  30 mL Oral Daily PRN Mansy, Jan A, MD       mometasone-formoterol [facility]) 100-5 MCG/ACT inhaler 2 puff  2 puff Inhalation BID Mansy, Jan A, MD   2 puff at 12/07/20 0901   morphine 2 MG/ML injection 2 mg  2  mg Intravenous Q4H PRN Mansy, Jan A, MD   2 mg at 12/07/20 0912   multivitamin with minerals tablet 1 tablet  1 tablet Oral Daily Mansy, Jan A, MD   1 tablet at 12/05/20 0845   nicotine (NICODERM CQ - dosed in mg/24 hours) patch 21 mg  21 mg Transdermal Daily Max Sane, MD   21 mg at 12/07/20 0901   ondansetron (ZOFRAN) tablet 4 mg  4 mg Oral Q6H PRN Mansy, Jan A, MD       Or   ondansetron [facility]) injection 4 mg  4 mg Intravenous Q6H PRN Mansy, Jan A, MD       pantoprazole (PROTONIX) injection 40 mg  40 mg Intravenous BID Piscoya, Jose, MD   40 mg at 12/07/20 0901   predniSONE (DELTASONE) tablet 10 mg  10 mg Oral BID Val Riles, MD   10 mg at 12/07/20 [id number]   sodium chloride flush (NS) 0.9 % injection 10-40 mL  10-40 mL Intracatheter Q12H Val Riles, MD       sodium chloride flush (NS) 0.9 % injection 10-40 mL  10-40 mL Intracatheter PRN Val Riles, MD       TPN ADULT (ION)   Intravenous Continuous TPN Noralee Space, RPH       traZODone (DESYREL) tablet 25 mg  25 mg Oral QHS PRN Mansy, Arvella Merles, MD         Abtx:  Anti-infectives (From admission, onward)    Start     Dose/Rate Route Frequency Ordered Stop   12/07/20 1600  ertapenem  (INVANZ) 1,000 mg in sodium chloride 0.9 % 100 mL IVPB       Note to Pharmacy: Thrombocytopenia possibly due to cefepime, hematology suggested to change antibiotics   1 g 200 mL/hr over 30 Minutes Intravenous Every 24 hours 12/07/20 0836     12/03/20 1400  ceFEPIme (MAXIPIME) 2 g in sodium chloride 0.9 % 100 mL IVPB  Status:  Discontinued        2 g 200 mL/hr over 30 Minutes Intravenous Every 8 hours 12/03/20 0637 12/07/20 0836   12/03/20 0545  ceFEPIme (MAXIPIME) 2 g in sodium chloride 0.9 % 100 mL IVPB        2 g 200 mL/hr over 30 Minutes Intravenous  Once 12/03/20 0532 12/03/20 0638   12/03/20 0545  metroNIDAZOLE (FLAGYL) IVPB 500 mg  Status:  Discontinued        500 mg 100 mL/hr over 60 Minutes Intravenous Every 12 hours 12/03/20 0532 12/07/20 1534   12/03/20 0330  piperacillin-tazobactam (ZOSYN) IVPB 3.375 g        3.375 g 100 mL/hr over 30 Minutes Intravenous  Once 12/03/20 0324 12/03/20 0401       REVIEW OF SYSTEMS:  Const:  fever,  chills,  weight loss Eyes: negative diplopia or visual changes, negative eye pain ENT: negative coryza, negative sore throat Resp:  cough, , dyspnea Cards: negative for chest pain, palpitations, lower extremity edema GU: negative for frequency, dysuria and hematuria GI: abdominal pain, diarrhea, bleeding, constipation Skin: negative for rash and pruritus Heme: negative for easy bruising and gum/nose bleeding MS: generalized weakness Neurolo:negative for headaches, dizziness, vertigo, memory problems  Psych: anxiety, depression  Endocrine: negative for thyroid, diabetes Allergy/Immunology- negative for any medication or food allergies  Objective:  VITALS:  BP (!) 145/91 (BP Location: Left Arm)   Pulse (!) 57   Temp (!) 97.5 F (36.4 C) (Oral)   Resp 18  Ht '5\' 10"'$  (1.778 m)   Wt 59.3 kg   SpO2 100%   BMI 18.76 kg/m  PHYSICAL EXAM:  General: Alert, cooperative, no distress, appears stated age.  Head: Normocephalic, without obvious  abnormality, atraumatic. Eyes: Conjunctivae clear, anicteric sclerae. Pupils are equal ENT Nares normal. No drainage or sinus tenderness. Tongue coated Neck: Supple, symmetrical, no adenopathy, thyroid: non tender no carotid bruit and no JVD. Back: No CVA tenderness. Lungs: b/l air entry Heart: Regular rate and rhythm, no murmur, rub or gallop. Abdomen: Soft, non-tender,not distended. Bowel sounds normal. No masses Extremities: atraumatic, no cyanosis. No edema. No clubbing Skin: bruising Lymph: Cervical, supraclavicular normal. Neurologic: clawing of hands Pertinent Labs Lab Results CBC    Component Value Date/Time   WBC 4.4 12/07/2020 0440   RBC 4.99 12/07/2020 0440   HGB 13.8 12/07/2020 0440   HCT 41.3 12/07/2020 0440   PLT 50 (L) 12/07/2020 0440   MCV 82.8 12/07/2020 0440   MCH 27.7 12/07/2020 0440   MCHC 33.4 12/07/2020 0440   RDW 19.3 (H) 12/07/2020 0440   LYMPHSABS 0.6 (L) 12/07/2020 0440   MONOABS 0.5 12/07/2020 0440   EOSABS 0.0 12/07/2020 0440   BASOSABS 0.1 12/07/2020 0440    CMP Latest Ref Rng & Units 12/07/2020 12/06/2020 12/05/2020  Glucose 70 - 99 mg/dL 120(H) 96 83  BUN 6 - 20 mg/dL '12 12 10  '$ Creatinine 0.61 - 1.24 mg/dL 0.51(L) 0.64 0.62  Sodium 135 - 145 mmol/L 138 137 136  Potassium 3.5 - 5.1 mmol/L 4.5 4.1 3.9  Chloride 98 - 111 mmol/L 110 110 107  CO2 22 - 32 mmol/L 23 21(L) 24  Calcium 8.9 - 10.3 mg/dL 8.1(L) 7.8(L) 7.6(L)  Total Protein 6.5 - 8.1 g/dL - - -  Total Bilirubin 0.3 - 1.2 mg/dL - - -  Alkaline Phos 38 - 126 U/L - - -  AST 15 - 41 U/L - - -  ALT 0 - 44 U/L - - -      Microbiology: Recent Results (from the past 240 hour(s))  Blood culture (routine x 2)     Status: None   Collection Time: 12/02/20  5:32 PM   Specimen: BLOOD  Result Value Ref Range Status   Specimen Description BLOOD LAC  Final   Special Requests   Final    BOTTLES DRAWN AEROBIC AND ANAEROBIC Blood Culture adequate volume   Culture   Final    NO GROWTH 5  DAYS Performed at [facility], [address] [place], [place] [address]    Report Status 12/07/2020 FINAL  Final  Resp Panel by RT-PCR (Flu A&B, Covid) Nasopharyngeal Swab     Status: None   Collection Time: 12/02/20 11:35 PM   Specimen: Nasopharyngeal Swab; Nasopharyngeal(NP) swabs in vial transport medium  Result Value Ref Range Status   SARS Coronavirus 2 by RT PCR NEGATIVE NEGATIVE Final    Comment: (NOTE) SARS-CoV-2 target nucleic acids are NOT DETECTED.  The SARS-CoV-2 RNA is generally detectable in upper respiratory specimens during the acute phase of infection. The lowest concentration of SARS-CoV-2 viral copies this assay can detect is 138 copies/mL. A negative result does not preclude SARS-Cov-2 infection and should not be used as the sole basis for treatment or other patient management decisions. A negative result may occur with  improper specimen collection/handling, submission of specimen other than nasopharyngeal swab, presence of viral mutation(s) within the areas targeted by this assay, and inadequate number of viral copies(<138 copies/mL). A negative  result must be combined with clinical observations, patient history, and epidemiological information. The expected result is Negative.  Fact Sheet for Patients:  EntrepreneurPulse.com.au  Fact Sheet for Healthcare Providers:  IncredibleEmployment.be  This test is no t yet approved or cleared by the [place] FDA and  has been authorized for detection and/or diagnosis of SARS-CoV-2 by FDA under an Emergency Use Authorization (EUA). This EUA will remain  in effect (meaning this test can be used) for the duration of the COVID-19 declaration under Section 564(b)(1) of the Act, 21 U.S.C.section 360bbb-3(b)(1), unless the authorization is terminated  or revoked sooner.       Influenza A by PCR NEGATIVE NEGATIVE Final   Influenza B by PCR NEGATIVE NEGATIVE Final     Comment: (NOTE) The Xpert Xpress SARS-CoV-2/FLU/RSV plus assay is intended as an aid in the diagnosis of influenza from Nasopharyngeal swab specimens and should not be used as a sole basis for treatment. Nasal washings and aspirates are unacceptable for Xpert Xpress SARS-CoV-2/FLU/RSV testing.  Fact Sheet for Patients: EntrepreneurPulse.com.au  Fact Sheet for Healthcare Providers: IncredibleEmployment.be  This test is not yet approved or cleared by the [place] FDA and has been authorized for detection and/or diagnosis of SARS-CoV-2 by FDA under an Emergency Use Authorization (EUA). This EUA will remain in effect (meaning this test can be used) for the duration of the COVID-19 declaration under Section 564(b)(1) of the Act, 21 U.S.C. section 360bbb-3(b)(1), unless the authorization is terminated or revoked.  Performed at [facility], [address] [place], [place] [address]   Blood culture (routine x 2)     Status: None   Collection Time: 12/02/20 11:43 PM   Specimen: BLOOD  Result Value Ref Range Status   Specimen Description BLOOD BLOOD LEFT HAND  Final   Special Requests   Final    BOTTLES DRAWN AEROBIC AND ANAEROBIC Blood Culture adequate volume   Culture   Final    NO GROWTH 5 DAYS Performed at [facility], [address], [place], [place] [address]    Report Status 12/07/2020 FINAL  Final    IMAGING RESULTS:  I have personally reviewed the films ?Previously seen cavitary lesion on prior CT is not well visualized on today's radiograph. There is a small likely residual cavitary region in the RIGHT lateral chest  CtTabdomen and pelvis Improved appearance of collection of fluid and air posterior to the gastric fundus at the level of a perforated gastric ulcer. Only a small amount of fluid is present between the stomach and diaphragm which includes some extravasated oral contrast. Visible disruption of  the posterior gastric wall is present at this level. Also suspected extruded previously placed endoscopic hemostatic clip(s) in the adjacent fat posterior to the stomach.  Impression/Recommendation ? Gastric perforation at the site of the previous hemostatic clips placed for gastric varices Now has a contained perforation with collection next to the fundus Usually Gastric organisms are much less in number  than colon and less diversity. He was on cefepime and flagyl and because of thrombocytopenia cefepime changed to ertapenem ( will do meropenem instead). 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 ? ___________________________________________________ Discussed with patient, requesting provider Note:  This document was prepared using Dragon voice recognition software and may include unintentional dictation errors.

## 2020-12-07 NOTE — Assessment & Plan Note (Addendum)
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  Currently on granix.  White count 14; discontinue Granix.  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  Platelets improving in 76s.  HIT panel negative; and since patient's thrombocytopenia is from DIC.  I think it is reasonable to resume heparin/Lovenox as deemed necessary by primary service.  #Gastric perforation s/p surgery-posterior collection- s/p robot assisted partial sleeve gastrectomy; Incisional hernia repair/ cholecystectomy-on antibiotics per ID-stable.

## 2020-12-07 NOTE — Progress Notes (Signed)
Critical lab called from [place] in lab a fibrinogen of 64. Patients nurse Jonna Clark notified and MD Dr. Val Riles notified secure chat.

## 2020-12-08 LAB — GLUCOSE, CAPILLARY
Glucose-Capillary: 107 mg/dL — ABNORMAL HIGH (ref 70–99)
Glucose-Capillary: 110 mg/dL — ABNORMAL HIGH (ref 70–99)
Glucose-Capillary: 142 mg/dL — ABNORMAL HIGH (ref 70–99)
Glucose-Capillary: 167 mg/dL — ABNORMAL HIGH (ref 70–99)

## 2020-12-08 LAB — TECHNOLOGIST SMEAR REVIEW: Plt Morphology: NORMAL

## 2020-12-08 LAB — CBC WITH DIFFERENTIAL/PLATELET
Abs Immature Granulocytes: 0.25 10*3/uL — ABNORMAL HIGH (ref 0.00–0.07)
Basophils Absolute: 0.1 10*3/uL (ref 0.0–0.1)
Basophils Relative: 3 %
Eosinophils Absolute: 0 10*3/uL (ref 0.0–0.5)
Eosinophils Relative: 0 %
HCT: 41.6 % (ref 39.0–52.0)
Hemoglobin: 13.8 g/dL (ref 13.0–17.0)
Immature Granulocytes: 11 %
Lymphocytes Relative: 27 %
Lymphs Abs: 0.6 10*3/uL — ABNORMAL LOW (ref 0.7–4.0)
MCH: 27.4 pg (ref 26.0–34.0)
MCHC: 33.2 g/dL (ref 30.0–36.0)
MCV: 82.5 fL (ref 80.0–100.0)
Monocytes Absolute: 0.5 10*3/uL (ref 0.1–1.0)
Monocytes Relative: 24 %
Neutro Abs: 0.8 10*3/uL — ABNORMAL LOW (ref 1.7–7.7)
Neutrophils Relative %: 35 %
Platelets: 44 10*3/uL — ABNORMAL LOW (ref 150–400)
RBC: 5.04 MIL/uL (ref 4.22–5.81)
RDW: 19.3 % — ABNORMAL HIGH (ref 11.5–15.5)
Smear Review: NORMAL
WBC: 2.2 10*3/uL — ABNORMAL LOW (ref 4.0–10.5)
nRBC: 3.2 % — ABNORMAL HIGH (ref 0.0–0.2)

## 2020-12-08 LAB — TRIGLYCERIDES: Triglycerides: 191 mg/dL — ABNORMAL HIGH (ref <150)

## 2020-12-08 LAB — BASIC METABOLIC PANEL
Anion gap: 4 — ABNORMAL LOW (ref 5–15)
BUN: 17 mg/dL (ref 6–20)
CO2: 27 mmol/L (ref 22–32)
Calcium: 8.1 mg/dL — ABNORMAL LOW (ref 8.9–10.3)
Chloride: 111 mmol/L (ref 98–111)
Creatinine, Ser: 0.54 mg/dL — ABNORMAL LOW (ref 0.61–1.24)
GFR, Estimated: >60 mL/min (ref >60)
Glucose, Bld: 130 mg/dL — ABNORMAL HIGH (ref 70–99)
Potassium: 4.7 mmol/L (ref 3.5–5.1)
Sodium: 142 mmol/L (ref 135–145)

## 2020-12-08 LAB — PHOSPHORUS: Phosphorus: 4.5 mg/dL (ref 2.5–4.6)

## 2020-12-08 LAB — MAGNESIUM: Magnesium: 2.3 mg/dL (ref 1.7–2.4)

## 2020-12-08 LAB — HEPARIN INDUCED PLATELET AB (HIT ANTIBODY): Heparin Induced Plt Ab: 0.089 OD (ref 0.000–0.400)

## 2020-12-08 MED ORDER — TRAVASOL 10 % IV SOLN
INTRAVENOUS | Status: AC
  Filled 2020-12-08: qty 672

## 2020-12-08 MED ORDER — SODIUM CHLORIDE 0.9 % IV SOLN: INTRAVENOUS | Status: DC

## 2020-12-08 NOTE — Progress Notes (Signed)
[facility] Progress Note  Patient: Marcus [name]    [id number]  DOA: 12/02/2020     Date of Service: the patient was seen and examined on 12/08/2020  Chief Complaint  Patient presents with   Shortness of Breath   Brief hospital course: 60 year old male with a known history of COPD, rheumatoid arthritis, Felty syndrome, chronic 2 L oxygen use, chronic smoker, history of MRSA pneumonia is admitted for contained gastric perforation   8/30: Oncology started Granix due to severe neutropenia  Assessment and Plan:   Active Problems:   Intra-abdominal abscess (HCC)   Sepsis present on admission due to underlying abdominal abscess/infection Contained perforated gastric ulcer Conservative management for now as per surgical team S/p cefepime and Flagyl, discontinued cefepime due to thrombocytopenia on 9/2 9/2 started meropenem 1 g IV every 8 hourly ID Continue IV antibiotics and fluids, keep n.p.o. General surgery consulted,  9/1 CT a/p: 1. Improved appearance of collection of fluid and air posterior to the gastric fundus at the level of a perforated gastric ulcer. Only a small amount of fluid is present between the stomach and diaphragmwhich includes some extravasated oral contrast. Visible disruption of the posterior gastric wall is present at this level. Also suspected extruded previously placed endoscopic hemostatic clip(s) in the adjacent fat posterior to the stomach. 2. Segmental narrowing of the ascending colon with suggestion of wall thickening. This may relate to peristalsis as this was not as evident on the prior study but the prior study was not performed with oral contrast. Underlying mass lesion of the ascending colon cannot be excluded and eventual correlation with colonoscopy may be helpful. 3. Stable infrarenal abdominal aortic aneurysm measuring 3.1 cm in maximum diameter. 4. Stable upper abdominal ventral hernia containing focal segment of transverse colon and  bilateral inguinal hernias. General surgery recommended conservative management, repeat CT abdomen pelvis on Monday continue for now NPO, IV fluids, IV abx. 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  Consultants: Surgery Oncology Infectious disease     Antibiotics: Cefepime d/c'd on 9/2 Flagyl d/c'd on 9/2 Meropenem 9/2---   Diet: NPO DVT Prophylaxis: Subcutaneous Lovenox   Advance goals of care discussion: Full code  Family Communication: family was NOT present at bedside, at the time of interview.  The pt provided permission to discuss medical plan with the family. Opportunity was given to ask question and all questions were answered satisfactorily.   Disposition:  Pt is from Home, admitted with abdominal abscess and perforation, still has perforation, which precludes a safe discharge. Discharge to Home, when when cleared by general surgery most likely after 3 to 5 days.  Subjective: No significant overnight issues, patient is still having significant abdominal pain, 10/10, it improved after morphine, currently pain is 7/10, patient was pointing pain to the right side of the belly.  Patient denied any nausea vomiting.   Physical Exam: General:  alert, NAD.  Appear in mild distress, affect appropriate Eyes: PERRLA ENT: Oral Mucosa Clear, moist  Neck: A JVD,  Cardiovascular: S1 and S2  Present, no Murmur,  Respiratory: good respiratory effort, Bilateral Air entry equal and Decreased, no Crackles, no wheezes Abdomen: Bowel Sound present, Soft and Generalized tenderness, more on the right side! Skin: No rashes Extremities: no Pedal edema, no calf tenderness Neurologic: without any new focal findings Gait not checked due to patient safety concerns  Vitals:   12/07/20 1634 12/07/20 2159 12/08/20 0522 12/08/20 0814  BP:  (!) 129/97 (!) 136/92 (!) 147/99  Pulse:  (!) 57 (!) 58 60  Resp:  '16 18 16  '$ Temp:  97.6 F (36.4 C)  (!) 97.4 F (36.3 C)  TempSrc:  Oral  Oral  SpO2:  97% 97% 97%  Weight: 58.3 kg     Height:        Intake/Output Summary (Last 24 hours) at 12/08/2020 1206 Last data filed at 12/08/2020 0800 Gross per 24 hour  Intake 1113.87 ml  Output 3100 ml  Net -1986.13 ml   Filed Weights   12/02/20 1720 12/03/20 2138 12/07/20 1634  Weight: 60 kg 59.3 kg 58.3 kg    Data Reviewed: I have personally reviewed and interpreted daily labs, tele strips, imagings as discussed above. I reviewed all nursing notes, pharmacy notes, vitals, pertinent old records I have discussed plan of care as described above with RN and patient/family.  CBC: Recent Labs  Lab 12/04/20 0414 12/05/20 0724 12/06/20 0450 12/07/20 0440 12/08/20 0530  WBC 1.5* 3.7* 6.7 4.4 2.2*  NEUTROABS 0.5* 2.7 5.6 3.0 0.8*  HGB 13.3 12.4* 13.1 13.8 13.8  HCT 40.6 36.8* 39.5 41.3 41.6  MCV 84.6 84.0 83.9 82.8 82.5  PLT 156 100* 74* 50* 44*   Basic Metabolic Panel: Recent Labs  Lab 12/04/20 0414 12/05/20 1518 12/06/20 0450 12/07/20 0440 12/08/20 0530  NA 135 136 137 138 142  K 3.8 3.9 4.1 4.5 4.7  CL 105 107 110 110 111  CO2 25 24 21* 23 27  GLUCOSE 95 83 96 120* 130*  BUN '12 10 12 12 17  '$ CREATININE 0.60* 0.62 0.64 0.51* 0.54*  CALCIUM 7.7* 7.6* 7.8* 8.1* 8.1*  MG 1.9 1.8 2.0 2.1 2.3  PHOS  --  2.7 3.3 3.7 4.5    Studies: No results found.  Scheduled Meds:  Chlorhexidine  Gluconate Cloth  6 each Topical Daily   diclofenac Sodium  2 g Topical QID   insulin aspart  0-9 Units Subcutaneous Q6H   mometasone-formoterol  2 puff Inhalation BID   multivitamin with minerals  1 tablet Oral Daily   nicotine  21 mg Transdermal Daily   pantoprazole (PROTONIX) IV  40 mg Intravenous BID   predniSONE  10 mg Oral BID   sodium chloride flush  10-40 mL Intracatheter Q12H   Continuous Infusions:  sodium chloride 45 mL/hr at 12/08/20 0338   sodium chloride     meropenem (MERREM) IV     TPN ADULT (ION) 30 mL/hr at 12/08/20 0338   TPN ADULT (ION)     PRN Meds: acetaminophen **OR** acetaminophen, albuterol, magnesium hydroxide, morphine injection, ondansetron **OR** ondansetron (ZOFRAN) IV, sodium chloride flush, traZODone  Time spent: 35 minutes  Author: Val Riles. MD [facility] 12/08/2020 12:06 PM  To reach On-call, see care teams to locate the attending and reach out to them via [email or web address]. If 7PM-7AM, please contact night-coverage If you still have difficulty reaching the attending provider, please page the [facility] (Director on Call) for [facility] on amion for assistance.

## 2020-12-08 NOTE — Progress Notes (Signed)
Mobility Specialist - Progress Note   12/08/20 1300  Mobility  Activity Sat and stood x 3  Level of Assistance Minimal assist, patient does 75% or more  Assistive Device Front wheel walker  Distance Ambulated (ft) 0 ft  Mobility Response Tolerated well  Mobility performed by Mobility specialist  $Mobility charge 1 Mobility    Pre-mobility: 67 HR, 98% SpO2 Post-mobility: 75 HR, 94% SpO2   Pt in bed utilizing 3L. Able to reach EOB with supervision. Pt performed STSx2 at bedside with minA. Unable to progress to ambulation as pt voices numbness in B feet. Attempted to perform plantarflexion with difficulty. "I can't do it. My feet are just too numb". Follows commands well. Pt returned to bed with alarm set. RN notified of performance.    Kathee Delton Mobility Specialist 12/08/20, 2:00 PM

## 2020-12-08 NOTE — Progress Notes (Signed)
Patient ID: FITZPATRICK VIVIAN, male   DOB: July 04, 1960, 60 y.o.   MRN: [id number]     [place] Hospital Day(s): 5.   Interval History: Patient seen and examined, no acute events or new complaints overnight. Patient reports feeling well this morning.  Denies any upper abdominal pain.  When questioned about pain he points to his right groin very far away from his stomach area.  No pain radiation.  No alleviating or grading factors.  Denies any nausea or vomiting.  Vital signs in last 24 hours: [min-max] current  Temp:  [97.4 F (36.3 C)-97.6 F (36.4 C)] 97.4 F (36.3 C) (09/03 0814) Pulse Rate:  [57-60] 60 (09/03 0814) Resp:  [16-18] 16 (09/03 0814) BP: (129-147)/(91-99) 147/99 (09/03 0814) SpO2:  [97 %-100 %] 97 % (09/03 0814) Weight:  [58.3 kg] 58.3 kg (09/02 1634)     Height: '5\' 10"'$  (177.8 cm) Weight: 58.3 kg BMI (Calculated): 18.44   Physical Exam:  Constitutional: alert, cooperative and no distress  Respiratory: breathing non-labored at rest  Cardiovascular: regular rate and sinus rhythm  Gastrointestinal: soft, non-tender, and non-distended.  Multiple abdominal hernias easily reducible without pain.  Labs:  CBC Latest Ref Rng & Units 12/08/2020 12/07/2020 12/06/2020  WBC 4.0 - 10.5 K/uL 2.2(L) 4.4 6.7  Hemoglobin 13.0 - 17.0 g/dL 13.8 13.8 13.1  Hematocrit 39.0 - 52.0 % 41.6 41.3 39.5  Platelets 150 - 400 K/uL 44(L) 50(L) 74(L)   CMP Latest Ref Rng & Units 12/08/2020 12/07/2020 12/06/2020  Glucose 70 - 99 mg/dL 130(H) 120(H) 96  BUN 6 - 20 mg/dL '17 12 12  '$ Creatinine 0.61 - 1.24 mg/dL 0.54(L) 0.51(L) 0.64  Sodium 135 - 145 mmol/L 142 138 137  Potassium 3.5 - 5.1 mmol/L 4.7 4.5 4.1  Chloride 98 - 111 mmol/L 111 110 110  CO2 22 - 32 mmol/L 27 23 21(L)  Calcium 8.9 - 10.3 mg/dL 8.1(L) 8.1(L) 7.8(L)  Total Protein 6.5 - 8.1 g/dL - - -  Total Bilirubin 0.3 - 1.2 mg/dL - - -  Alkaline Phos 38 - 126 U/L - - -  AST 15 - 41 U/L - - -  ALT 0 - 44 U/L - - -    Imaging studies: I  personally evaluated the CT scan of the abdomen and pelvis.  Small contained perforation without free air.  No free fluid.  Improved from previous CT scan.   Assessment/Plan:  60 y.o. male with gastric content perforation, complicated by pertinent comorbidities including COPD, Felty syndrome, chronic prednisone, prior opiate bleeding, chronic neutropenia.  Patient continue with stable vital signs, no fever.  White blood cell count within patient's normal limits.  Physical exam not concerning of any clinical deterioration.  We will continue with same plan of IV antibiotic therapy, strict n.p.o., TPN.  We will repeat CT scan early next week assessed healing process of gastric content perforation.  No indication for emergent surgery at this moment.  We will continue to follow closely.  Arnold Long, MD

## 2020-12-08 NOTE — Progress Notes (Signed)
PHARMACY - TOTAL PARENTERAL NUTRITION CONSULT NOTE   Indication: perforated gastric ulcer  Patient Measurements: Height: '5\' 10"'$  (177.8 cm) Weight: 58.3 kg (128 lb 8.5 oz) IBW/kg (Calculated) : 73 TPN AdjBW (KG): 59.3 Body mass index is 18.44 kg/m. Usual Weight:    Assessment:  admitted multiple times recently with hemoptysis and gastric bleeding s/p EGD and clipping in fundus, with cavitary pneumonia, with seizures.  Has history of drug use and was cocaine positive on one of his admissions.  Has also hx of Felty syndrome with neutropenia.  He has surgical history of splenectomy in 2017 and exploratory laparotomy with Phillip Heal patch repair of a pyloric channel perforated ulcer in 2021  Glucose / Insulin: on prednisone '10mg'$  BID for RA BG 120 > 140, no hx DM Electrolytes: WNL Renal: Scr 0.54 Hepatic: ASt/ALT 32.10  8/30 Intake / Output; MIVF: on NS GI Imaging:perforated gastric ulcer GI Surgeries / Procedures:  ABX: meropenem  Central access: PICC ordered for 9/2 TPN start date: 9/2 if gets PICC  Nutritional Goals: Goal TPN rate is 85 mL/hr (provides 102 g of protein and 2060 kcals per day)  RD Assessment: Estimated Needs Total Energy Estimated Needs: 1900-2200kcal/day Total Protein Estimated Needs: 95-110g/day Total Fluid Estimated Needs: 1.8-2.1L/day  Current Nutrition:  NPO  Plan:  Increase TPN to 2/3 rate:  56 mL/hr at 1800. Pt at risk for refeeding -Nutritional components at goal rate, per Liter: Amino acids (Travasol 10%): 50grams Lipids (20% SMOF): 30 grams Dextrose: 15% kCal: 2060/ 24h -Electrolytes in TPN: Na 48mq/L, K 520m/L, Ca 40m44mL, Mg 40mE38m, and Phos 140mm3m. Cl:Ac 1:1 -Add standard MVI and trace elements to TPN - Add thiamine '100mg'$  daily x 3 days(start 12/07/20) -Initiate Sensitive q6h SSI and adjust as needed. On oral prednisone, no hx DM  -Reduce MIVF to 30 mL/hr at 1800   -Monitor TPN labs every 3 days at start then on Mon/Thurs, [id number],10:27 AM

## 2020-12-09 LAB — GLUCOSE, CAPILLARY
Glucose-Capillary: 174 mg/dL — ABNORMAL HIGH (ref 70–99)
Glucose-Capillary: 171 mg/dL — ABNORMAL HIGH (ref 70–99)
Glucose-Capillary: 120 mg/dL — ABNORMAL HIGH (ref 70–99)

## 2020-12-09 LAB — PHOSPHORUS: Phosphorus: 3.8 mg/dL (ref 2.5–4.6)

## 2020-12-09 LAB — CBC WITH DIFFERENTIAL/PLATELET
Abs Immature Granulocytes: 0 10*3/uL (ref 0.00–0.07)
Basophils Absolute: 0.1 10*3/uL (ref 0.0–0.1)
Basophils Relative: 4 %
Eosinophils Absolute: 0 10*3/uL (ref 0.0–0.5)
Eosinophils Relative: 0 %
HCT: 44 % (ref 39.0–52.0)
Hemoglobin: 13.9 g/dL (ref 13.0–17.0)
Immature Granulocytes: 0 %
Lymphocytes Relative: 35 %
Lymphs Abs: 0.4 10*3/uL — ABNORMAL LOW (ref 0.7–4.0)
MCH: 30.1 pg (ref 26.0–34.0)
MCHC: 31.6 g/dL (ref 30.0–36.0)
MCV: 95.2 fL (ref 80.0–100.0)
Monocytes Absolute: 0.3 10*3/uL (ref 0.1–1.0)
Monocytes Relative: 22 %
Neutro Abs: 0.5 10*3/uL — ABNORMAL LOW (ref 1.7–7.7)
Neutrophils Relative %: 39 %
Platelets: 56 10*3/uL — ABNORMAL LOW (ref 150–400)
RBC: 4.62 MIL/uL (ref 4.22–5.81)
RDW: 21.8 % — ABNORMAL HIGH (ref 11.5–15.5)
Smear Review: DECREASED
WBC: 1.3 10*3/uL — CL (ref 4.0–10.5)
nRBC: 9.4 % — ABNORMAL HIGH (ref 0.0–0.2)

## 2020-12-09 LAB — BASIC METABOLIC PANEL
Anion gap: 6 (ref 5–15)
BUN: 18 mg/dL (ref 6–20)
CO2: 28 mmol/L (ref 22–32)
Calcium: 8.2 mg/dL — ABNORMAL LOW (ref 8.9–10.3)
Chloride: 103 mmol/L (ref 98–111)
Creatinine, Ser: 0.41 mg/dL — ABNORMAL LOW (ref 0.61–1.24)
GFR, Estimated: >60 mL/min (ref >60)
Glucose, Bld: 139 mg/dL — ABNORMAL HIGH (ref 70–99)
Potassium: 4.8 mmol/L (ref 3.5–5.1)
Sodium: 137 mmol/L (ref 135–145)

## 2020-12-09 LAB — MAGNESIUM: Magnesium: 2.4 mg/dL (ref 1.7–2.4)

## 2020-12-09 MED ORDER — MORPHINE SULFATE (PF) 4 MG/ML IV SOLN
4.0000 mg | INTRAVENOUS | Status: DC | PRN
  Administered 2020-12-09 – 2020-12-12 (×10): 4 mg via INTRAVENOUS
  Filled 2020-12-09 (×11): qty 1

## 2020-12-09 MED ORDER — TRAVASOL 10 % IV SOLN
INTRAVENOUS | Status: AC
  Filled 2020-12-09: qty 1020

## 2020-12-09 MED ORDER — TBO-FILGRASTIM 300 MCG/0.5ML ~~LOC~~ SOSY: 300.0000 ug | PREFILLED_SYRINGE | Freq: Every day | SUBCUTANEOUS | Status: DC

## 2020-12-09 MED ORDER — TBO-FILGRASTIM 300 MCG/0.5ML ~~LOC~~ SOSY
300.0000 ug | PREFILLED_SYRINGE | Freq: Every day | SUBCUTANEOUS | Status: DC
  Administered 2020-12-09 – 2020-12-10 (×2): 300 ug via SUBCUTANEOUS
  Filled 2020-12-09 (×3): qty 0.5

## 2020-12-09 NOTE — Progress Notes (Signed)
PT Cancellation Note  Patient Details Name: PAVLO IOCCO MRN: [id number] DOB: 07-30-1960   Cancelled Treatment:    Reason Eval/Treat Not Completed: Patient not medically ready PT orders received, chart reviewed. Pt noted to have low WBC & MD requesting to hold PT evaluation until tomorrow. Will f/u as able & as pt is medically appropriate to participate.   Lavone Nian, PT, DPT 12/09/20, 2:00 PM   Waunita Schooner 12/09/2020, 2:00 PM

## 2020-12-09 NOTE — Progress Notes (Signed)
[facility] Progress Note  Patient: Marcus [name]    [id number]  DOA: 12/02/2020     Date of Service: the patient was seen and examined on 12/09/2020  Chief Complaint  Patient presents with   Shortness of Breath   Brief hospital course: 60 year old male with a known history of COPD, rheumatoid arthritis, Felty syndrome, chronic 2 L oxygen use, chronic smoker, history of MRSA pneumonia is admitted for contained gastric perforation   8/30: Oncology started Granix due to severe neutropenia  Assessment and Plan:   Active Problems:   Intra-abdominal abscess (HCC)   Sepsis present on admission due to underlying abdominal abscess/infection Contained perforated gastric ulcer Conservative management for now as per surgical team S/p cefepime and Flagyl, discontinued cefepime due to thrombocytopenia on 9/2 9/2 started meropenem 1 g IV every 8 hourly ID Continue IV antibiotics and fluids, keep n.p.o. General surgery consulted,  9/1 CT a/p: 1. Improved appearance of collection of fluid and air posterior to the gastric fundus at the level of a perforated gastric ulcer. Only a small amount of fluid is present between the stomach and diaphragmwhich includes some extravasated oral contrast. Visible disruption of the posterior gastric wall is present at this level. Also suspected extruded previously placed endoscopic hemostatic clip(s) in the adjacent fat posterior to the stomach. 2. Segmental narrowing of the ascending colon with suggestion of wall thickening. This may relate to peristalsis as this was not as evident on the prior study but the prior study was not performed with oral contrast. Underlying mass lesion of the ascending colon cannot be excluded and eventual correlation with colonoscopy may be helpful. 3. Stable infrarenal abdominal aortic aneurysm measuring 3.1 cm in maximum diameter. 4. Stable upper abdominal ventral hernia containing focal segment of transverse colon and  bilateral inguinal hernias. General surgery recommended conservative management, repeat CT abdomen pelvis on Monday continue for now NPO, IV fluids, IV abx. 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  Consultants: Surgery Oncology Infectious disease     Antibiotics: Cefepime d/c'd on 9/2 Flagyl d/c'd on 9/2 Meropenem 9/2---   Diet: NPO DVT Prophylaxis: Subcutaneous Lovenox   Advance goals of care discussion: Full code  Family Communication: family was NOT present at bedside, at the time of interview.  The pt provided permission to discuss medical plan with the family. Opportunity was given to ask question and all questions were answered satisfactorily.   Disposition:  Pt is from Home, admitted with abdominal abscess and perforation, still has perforation, which precludes a safe discharge. Discharge to Home, when when cleared by general surgery most likely after 3 to 5 days.  Subjective: No significant overnight issues, patient is still having significant abdominal pain, 10/10, it improved after morphine, currently pain is 7/10, patient was pointing pain to the right side of  the belly.  Patient denied any  nausea vomiting.   Physical Exam: General:  alert, NAD.  Appear in mild distress, affect appropriate Eyes: PERRLA ENT: Oral Mucosa Clear, moist  Neck: A JVD,  Cardiovascular: S1 and S2 Present, no Murmur,  Respiratory: good respiratory effort, Bilateral Air entry equal and Decreased, no Crackles, no wheezes Abdomen: Bowel Sound present, Soft and Generalized tenderness, more on the right side! Skin: No rashes Extremities: no Pedal edema, no calf tenderness Neurologic: without any new focal findings Gait not checked due to patient safety concerns  Vitals:   12/08/20 1504 12/08/20 1937 12/09/20 0545 12/09/20 0743  BP: (!) 147/97 139/90 (!) 142/97 (!) 150/104  Pulse: 63 64 74 76  Resp: '18 16 20 18  '$ Temp: (!) 97.2 F (36.2 C) (!) 97.5 F (36.4 C) (!) 97.5 F (36.4 C) (!) 97.5 F (36.4 C)  TempSrc: Oral Oral Oral Oral  SpO2: 99% 97% 97% 96%  Weight:   55.9 kg   Height:        Intake/Output Summary (Last 24 hours) at 12/09/2020 1212 Last data filed at 12/09/2020 0900 Gross per 24 hour  Intake 1786.53 ml  Output 2030 ml  Net -243.47 ml   Filed Weights   12/03/20 2138 12/07/20 1634 12/09/20 0545  Weight: 59.3 kg 58.3 kg 55.9 kg    Data Reviewed: I have personally reviewed and interpreted daily labs, tele strips, imagings as discussed above. I reviewed all nursing notes, pharmacy notes, vitals, pertinent old records I have discussed plan of care as described above with RN and patient/family.  CBC: Recent Labs  Lab 12/05/20 0724 12/06/20 0450 12/07/20 0440 12/08/20 0530 12/09/20 0435  WBC 3.7* 6.7 4.4 2.2* 1.3*  NEUTROABS 2.7 5.6 3.0 0.8* 0.5*  HGB 12.4* 13.1 13.8 13.8 13.9  HCT 36.8* 39.5 41.3 41.6 44.0  MCV 84.0 83.9 82.8 82.5 95.2  PLT 100* 74* 50* 44* 56*   Basic Metabolic Panel: Recent Labs  Lab 12/05/20 1518 12/06/20 0450 12/07/20 0440 12/08/20 0530 12/09/20 0700  NA 136 137 138 142 137  K 3.9 4.1 4.5 4.7 4.8  CL 107 110 110 111 103  CO2 24 21* '23 27  28  '$ GLUCOSE 83 96 120* 130* 139*  BUN '10 12 12 17 18  '$ CREATININE 0.62 0.64 0.51* 0.54* 0.41*  CALCIUM 7.6* 7.8* 8.1* 8.1* 8.2*  MG 1.8 2.0 2.1 2.3 2.4  PHOS 2.7 3.3 3.7 4.5 3.8    Studies: No results found.  Scheduled Meds:  Chlorhexidine Gluconate Cloth  6 each Topical Daily   diclofenac Sodium  2 g Topical QID   insulin aspart  0-9 Units Subcutaneous Q6H   mometasone-formoterol  2 puff Inhalation BID   multivitamin with minerals  1 tablet Oral Daily   nicotine  21 mg Transdermal Daily   pantoprazole (PROTONIX) IV  40 mg Intravenous BID   predniSONE  10 mg Oral BID   sodium chloride flush  10-40 mL Intracatheter Q12H   Tbo-filgastrim (GRANIX) SQ  300 mcg Subcutaneous Daily   Continuous Infusions:  sodium chloride 30 mL/hr at 12/08/20 2320   meropenem (MERREM) IV 1 g (12/09/20 0717)   TPN ADULT (ION) 56 mL/hr at 12/09/20 0717   TPN ADULT (ION)     PRN Meds: acetaminophen **OR** acetaminophen, albuterol, magnesium hydroxide, morphine injection, ondansetron **OR** ondansetron (ZOFRAN) IV, sodium chloride flush, traZODone  Time spent: 35 minutes  Author: Val Riles. MD [facility] 12/09/2020 12:12 PM  To reach On-call, see care  teams to locate the attending and reach out to them via [email or web address]. If 7PM-7AM, please contact night-coverage If you still have difficulty reaching the attending provider, please page the [facility] (Director on Call) for [facility] on amion for assistance.

## 2020-12-09 NOTE — Progress Notes (Signed)
Patient ID: ALEXA PANDYA, male   DOB: Aug 13, 1960, 60 y.o.   MRN: [id number]     [place] Hospital Day(s): 6.   Interval History: Patient seen and examined, no acute events or new complaints overnight. Patient reports feeling okay.  He continue pointing the right groin as the area of the pain.  No upper abdominal pain.  No pain radiation.  There has been no alleviating or aggravating factor.  Patient denies any nausea or vomiting.  Patient denies any fever or chills.  Vital signs in last 24 hours: [min-max] current  Temp:  [97.2 F (36.2 C)-97.5 F (36.4 C)] 97.5 F (36.4 C) (09/04 0743) Pulse Rate:  [63-76] 76 (09/04 0743) Resp:  [16-20] 18 (09/04 0743) BP: (139-150)/(90-104) 150/104 (09/04 0743) SpO2:  [96 %-99 %] 96 % (09/04 0743) Weight:  [55.9 kg] 55.9 kg (09/04 0545)     Height: '5\' 10"'$  (177.8 cm) Weight: 55.9 kg BMI (Calculated): 17.68   Physical Exam:  Constitutional: alert, cooperative and no distress  Respiratory: breathing non-labored at rest  Cardiovascular: regular rate and sinus rhythm  Gastrointestinal: soft, non-tender, and non-distended.  Multiple incision and right inguinal hernias, soft, reducible.  Labs:  CBC Latest Ref Rng & Units 12/09/2020 12/08/2020 12/07/2020  WBC 4.0 - 10.5 K/uL 1.3(LL) 2.2(L) 4.4  Hemoglobin 13.0 - 17.0 g/dL 13.9 13.8 13.8  Hematocrit 39.0 - 52.0 % 44.0 41.6 41.3  Platelets 150 - 400 K/uL 56(L) 44(L) 50(L)   CMP Latest Ref Rng & Units 12/09/2020 12/08/2020 12/07/2020  Glucose 70 - 99 mg/dL 139(H) 130(H) 120(H)  BUN 6 - 20 mg/dL '18 17 12  '$ Creatinine 0.61 - 1.24 mg/dL 0.41(L) 0.54(L) 0.51(L)  Sodium 135 - 145 mmol/L 137 142 138  Potassium 3.5 - 5.1 mmol/L 4.8 4.7 4.5  Chloride 98 - 111 mmol/L 103 111 110  CO2 22 - 32 mmol/L '28 27 23  '$ Calcium 8.9 - 10.3 mg/dL 8.2(L) 8.1(L) 8.1(L)  Total Protein 6.5 - 8.1 g/dL - - -  Total Bilirubin 0.3 - 1.2 mg/dL - - -  Alkaline Phos 38 - 126 U/L - - -  AST 15 - 41 U/L - - -  ALT 0 - 44 U/L - - -     Imaging studies: No new pertinent imaging studies   Assessment/Plan:  60 y.o. male with gastric content perforation, complicated by pertinent comorbidities including COPD, Felty syndrome, chronic prednisone, prior opiate bleeding, chronic neutropenia.  Patient continues stable, no clinical deterioration.  There has been no fever.  No tachycardia.  Patient continue with severe neutropenia and thrombocytopenia.  We will appreciate hematology recommendation and optimization in case patient needs surgical management.  From surgical management standpoint we will continue with plan of repeating CT scan early during the week to assess healing of the ulcer.  Patient was oriented that if the ulcer does not heal he may need surgery but will try to continue conservative management.  Continue current management as per primary team.  We will continue to follow closely.  Arnold Long, MD

## 2020-12-09 NOTE — Progress Notes (Signed)
PHARMACY - TOTAL PARENTERAL NUTRITION CONSULT NOTE   Indication: perforated gastric ulcer  Patient Measurements: Height: '5\' 10"'$  (177.8 cm) Weight: 55.9 kg (123 lb 3.8 oz) IBW/kg (Calculated) : 73 TPN AdjBW (KG): 59.3 Body mass index is 17.68 kg/m. Usual Weight:    Assessment:  admitted multiple times recently with hemoptysis and gastric bleeding s/p EGD and clipping in fundus, with cavitary pneumonia, with seizures.  Has history of drug use and was cocaine positive on one of his admissions.  Has also hx of Felty syndrome with neutropenia.  He has surgical history of splenectomy in 2017 and exploratory laparotomy with Phillip Heal patch repair of a pyloric channel perforated ulcer in 2021  Glucose / Insulin: on prednisone '10mg'$  BID for RA BG 120 > 140, no hx DM Electrolytes: WNL Renal: Scr 0.54 Hepatic: ASt/ALT 32.10  8/30 Intake / Output; MIVF: on NS GI Imaging:perforated gastric ulcer GI Surgeries / Procedures:  ABX: meropenem  Central access: PICC ordered for 9/2 TPN start date: 9/2 if gets PICC  Nutritional Goals: Goal TPN rate is 85 mL/hr (provides 102 g of protein and 2060 kcals per day)  RD Assessment: Estimated Needs Total Energy Estimated Needs: 1900-2200kcal/day Total Protein Estimated Needs: 95-110g/day Total Fluid Estimated Needs: 1.8-2.1L/day  Current Nutrition:  NPO  Plan:  Increase TPN to goal rate: 85 mL/hr at 1800.  -Nutritional components at goal rate, per Liter: Amino acids (Travasol 10%): 50grams Lipids (20% SMOF): 30 grams Dextrose: 15% kCal: 2060/ 24h -Electrolytes in TPN: Na 78mq/L, K 57m/L, Ca 71m24mL, Mg 71mE36m, and Phos 171mm671m. Cl:Ac 1:1 -Add standard MVI and trace elements to TPN - Add thiamine '100mg'$  daily x 3 days (start 12/07/20) -Initiate Sensitive q6h SSI and adjust as needed. On oral prednisone, no hx DM  -stop MIVF.  -Monitor TPN labs every 3 days at start then on Mon/Thurs, [id number], BCPS 12/09/2020,10:05 AM

## 2020-12-09 NOTE — Progress Notes (Addendum)
Critical labs reported to nurse via phone call from lab tech Dewaine Oats on WBC and neutrophil absolute count. Provider notified via secure chat. Awaiting provider response.

## 2020-12-09 NOTE — Progress Notes (Signed)
Patient ID: SADIKI LOLLIE, male   DOB: Sep 11, 1960, 60 y.o.   MRN: [id number] SURGERY FOLLOW UP NOTE  Patient re evaluated due to concern of increased pain. Upon my evaluation patient reports feeling "alright". Continue with lower abdomen/groin pain. No change of pain location. He has been having pain 6-10/10 throughout admission.   Vitals:   12/09/20 0743 12/09/20 1601  BP: (!) 150/104 (!) 153/104  Pulse: 76 75  Resp: 18 18  Temp: (!) 97.5 F (36.4 C) (!) 97.5 F (36.4 C)  SpO2: 96% 100%   Abdomen: soft, mild tender on right groin area. Non tender on epigastric area. Reducible hernias.  A/P:   Patient continue stable without clinical deterioration. Stable vital signs, no fever, no tachycardia, continue hypertensive. I will continue with plan of CT scan tomorrow morning. In increased morphine dose. Patient requesting Dilaudid. Will assess response to morphine higher dose first.   This follow up encounter was more than 30 minutes most of the time counseling the patient and coordinating plan of care.   Dr. Windell Moment

## 2020-12-09 NOTE — Plan of Care (Signed)

## 2020-12-10 ENCOUNTER — Inpatient Hospital Stay: Payer: Medicaid Other

## 2020-12-10 ENCOUNTER — Encounter: Payer: Self-pay | Admitting: Family Medicine

## 2020-12-10 DIAGNOSIS — K255 Chronic or unspecified gastric ulcer with perforation: Secondary | ICD-10-CM | POA: Diagnosis not present

## 2020-12-10 DIAGNOSIS — D709 Neutropenia, unspecified: Secondary | ICD-10-CM

## 2020-12-10 DIAGNOSIS — A419 Sepsis, unspecified organism: Secondary | ICD-10-CM

## 2020-12-10 DIAGNOSIS — I714 Abdominal aortic aneurysm, without rupture, unspecified: Secondary | ICD-10-CM

## 2020-12-10 DIAGNOSIS — K651 Peritoneal abscess: Secondary | ICD-10-CM | POA: Diagnosis not present

## 2020-12-10 LAB — CBC WITH DIFFERENTIAL/PLATELET
Abs Immature Granulocytes: 0.39 10*3/uL — ABNORMAL HIGH (ref 0.00–0.07)
Basophils Absolute: 0.1 10*3/uL (ref 0.0–0.1)
Basophils Relative: 2 %
Eosinophils Absolute: 0 10*3/uL (ref 0.0–0.5)
Eosinophils Relative: 0 %
HCT: 44.9 % (ref 39.0–52.0)
Hemoglobin: 15.2 g/dL (ref 13.0–17.0)
Immature Granulocytes: 10 %
Lymphocytes Relative: 16 %
Lymphs Abs: 0.7 10*3/uL (ref 0.7–4.0)
MCH: 27.5 pg (ref 26.0–34.0)
MCHC: 33.9 g/dL (ref 30.0–36.0)
MCV: 81.2 fL (ref 80.0–100.0)
Monocytes Absolute: 0.6 10*3/uL (ref 0.1–1.0)
Monocytes Relative: 15 %
Neutro Abs: 2.3 10*3/uL (ref 1.7–7.7)
Neutrophils Relative %: 57 %
Platelets: 20 10*3/uL — CL (ref 150–400)
RBC: 5.53 MIL/uL (ref 4.22–5.81)
RDW: 20.7 % — ABNORMAL HIGH (ref 11.5–15.5)
WBC: 4.1 10*3/uL (ref 4.0–10.5)
nRBC: 5.6 % — ABNORMAL HIGH (ref 0.0–0.2)

## 2020-12-10 LAB — COMPREHENSIVE METABOLIC PANEL
ALT: 18 U/L (ref 0–44)
AST: 55 U/L — ABNORMAL HIGH (ref 15–41)
Albumin: 2.3 g/dL — ABNORMAL LOW (ref 3.5–5.0)
Alkaline Phosphatase: 311 U/L — ABNORMAL HIGH (ref 38–126)
Anion gap: 5 (ref 5–15)
BUN: 24 mg/dL — ABNORMAL HIGH (ref 6–20)
CO2: 28 mmol/L (ref 22–32)
Calcium: 8.2 mg/dL — ABNORMAL LOW (ref 8.9–10.3)
Chloride: 99 mmol/L (ref 98–111)
Creatinine, Ser: <0.3 mg/dL — ABNORMAL LOW (ref 0.61–1.24)
Glucose, Bld: 86 mg/dL (ref 70–99)
Potassium: 6.1 mmol/L — ABNORMAL HIGH (ref 3.5–5.1)
Sodium: 132 mmol/L — ABNORMAL LOW (ref 135–145)
Total Bilirubin: 4.2 mg/dL — ABNORMAL HIGH (ref 0.3–1.2)
Total Protein: 5.6 g/dL — ABNORMAL LOW (ref 6.5–8.1)

## 2020-12-10 LAB — GLUCOSE, CAPILLARY
Glucose-Capillary: 110 mg/dL — ABNORMAL HIGH (ref 70–99)
Glucose-Capillary: 132 mg/dL — ABNORMAL HIGH (ref 70–99)
Glucose-Capillary: 135 mg/dL — ABNORMAL HIGH (ref 70–99)
Glucose-Capillary: 147 mg/dL — ABNORMAL HIGH (ref 70–99)
Glucose-Capillary: 174 mg/dL — ABNORMAL HIGH (ref 70–99)

## 2020-12-10 LAB — POTASSIUM: Potassium: 5.2 mmol/L — ABNORMAL HIGH (ref 3.5–5.1)

## 2020-12-10 LAB — MAGNESIUM: Magnesium: 2.3 mg/dL (ref 1.7–2.4)

## 2020-12-10 LAB — TRIGLYCERIDES: Triglycerides: 418 mg/dL — ABNORMAL HIGH (ref <150)

## 2020-12-10 LAB — PHOSPHORUS: Phosphorus: 2.3 mg/dL — ABNORMAL LOW (ref 2.5–4.6)

## 2020-12-10 LAB — BILIRUBIN, DIRECT: Bilirubin, Direct: 2.2 mg/dL — ABNORMAL HIGH (ref 0.0–0.2)

## 2020-12-10 LAB — MRSA NEXT GEN BY PCR, NASAL: MRSA by PCR Next Gen: NOT DETECTED

## 2020-12-10 IMAGING — CT CT ABD-PELV W/ CM
2 of 5 series · 15 of 46 positions shown, 17 images · IV contrast (APPLIED)
Comparison: [DATE]

CLINICAL DATA: Continued RIGHT lower quadrant abdominal pain,
suspected abscess/infection

EXAM:
CT ABDOMEN AND PELVIS WITH CONTRAST
TECHNIQUE: Multidetector CT imaging of the abdomen and pelvis was performed
using the standard protocol following bolus administration of
intravenous contrast. Sagittal and coronal MPR images reconstructed
from axial data set.
CONTRAST:  75mL OMNIPAQUE IOHEXOL 350 MG/ML SOLN IV. Dilute oral
contrast.

[Series 2: routine abd/pel with · axial · 0.69mm/px · z∈[-513,-103]mm · 12 of 94 slices shown, 14 images]
[im 6/94  soft-tissue]
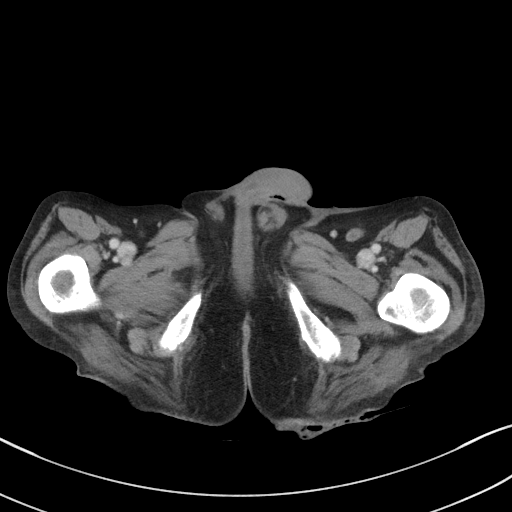
[im 6/94  bone]
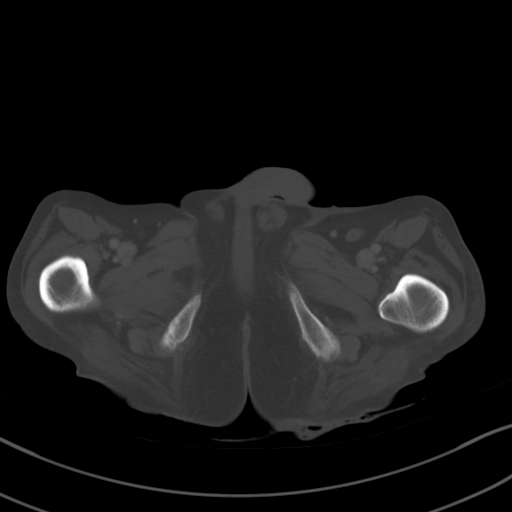
[im 17/94  soft-tissue]
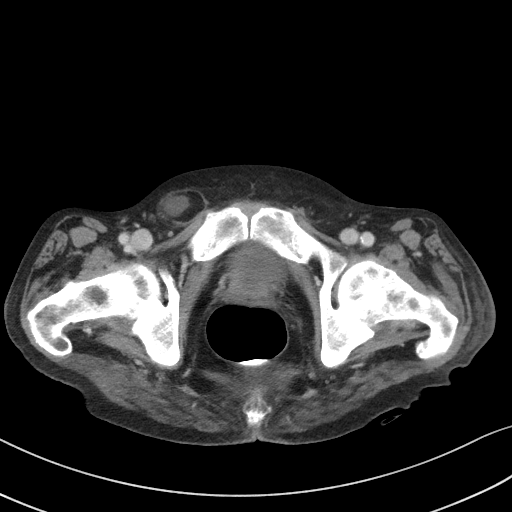
[im 22/94  soft-tissue]
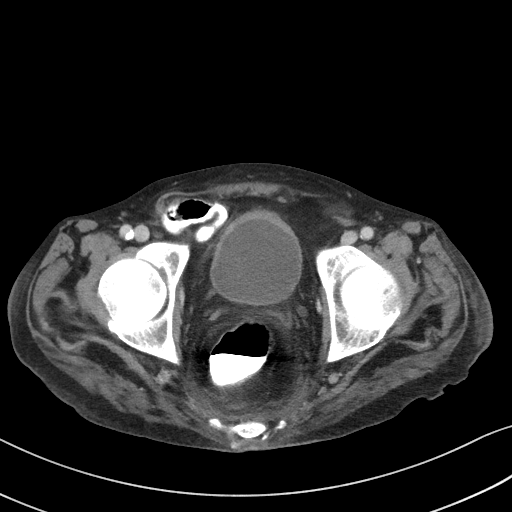
[im 28/94  soft-tissue]
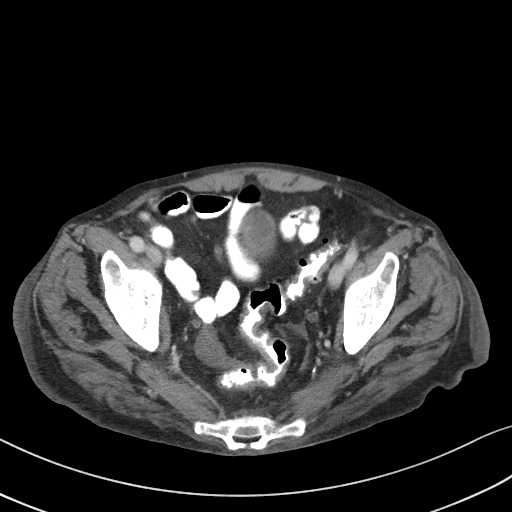
[im 39/94  soft-tissue]
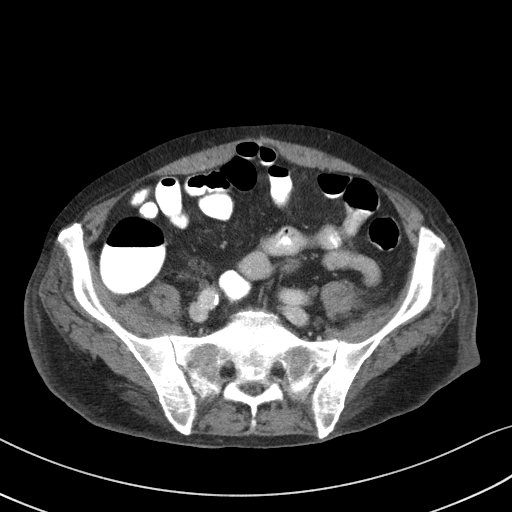
[im 44/94  soft-tissue]
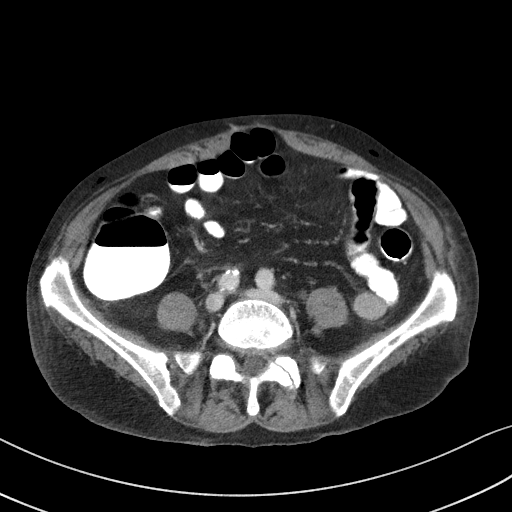
[im 50/94  soft-tissue]
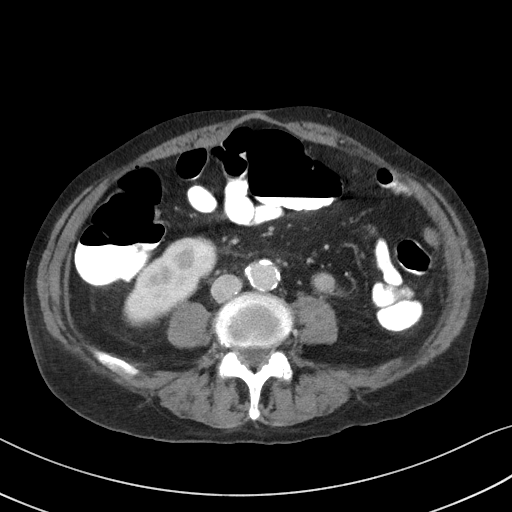
[im 61/94  soft-tissue]
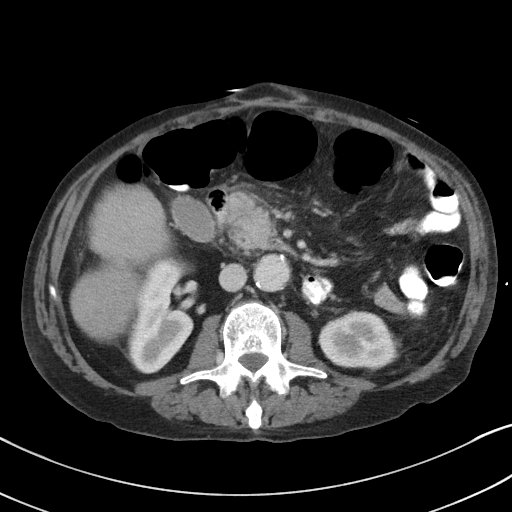
[im 66/94  soft-tissue]
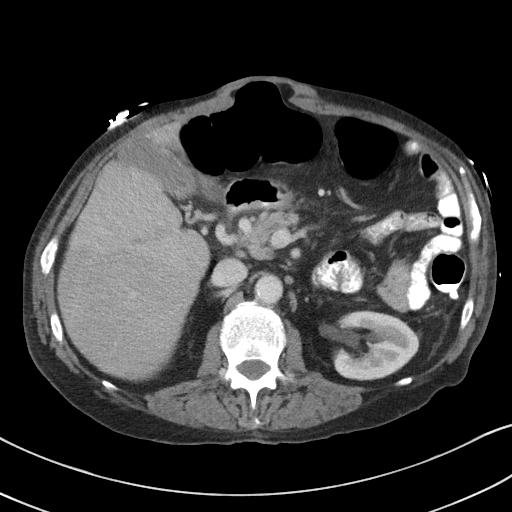
[im 66/94  bone]
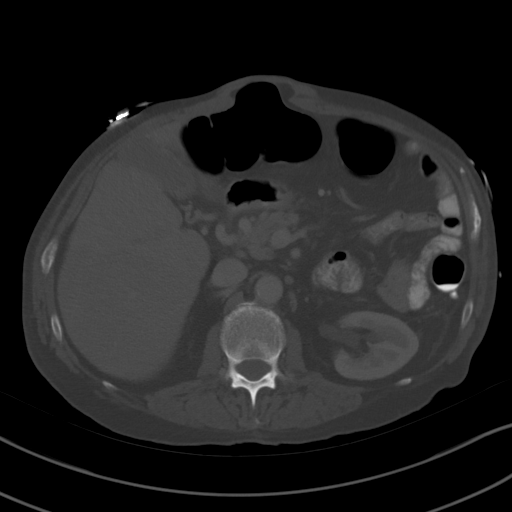
[im 72/94  soft-tissue]
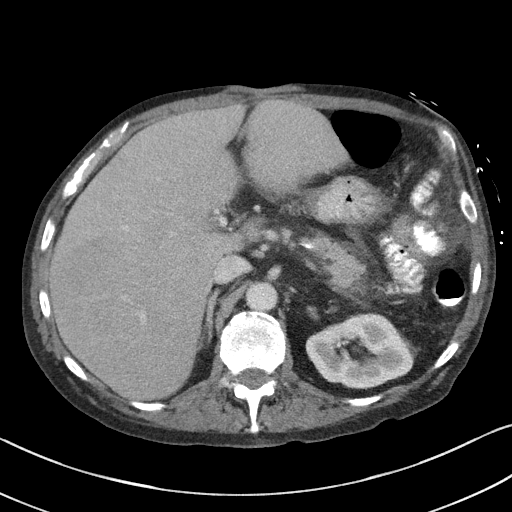
[im 83/94  soft-tissue]
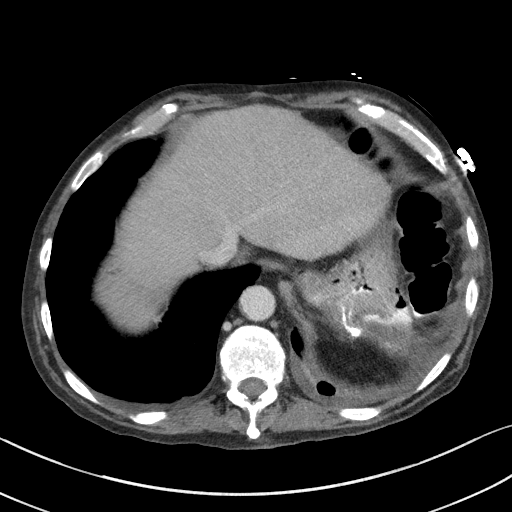
[im 88/94  soft-tissue]
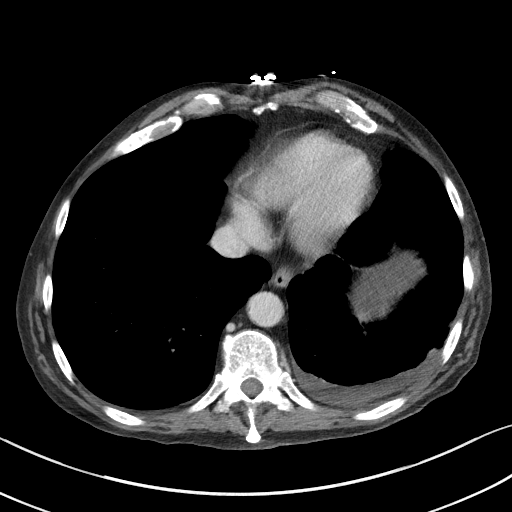

[Series 6: coronal st · coronal · 0.71mm/px · 3 of 87 slices shown]
[im 29/87  soft-tissue]
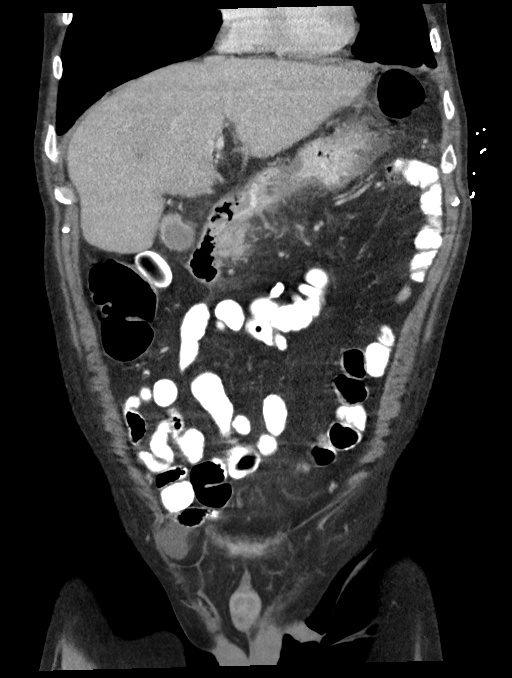
[im 39/87  soft-tissue]
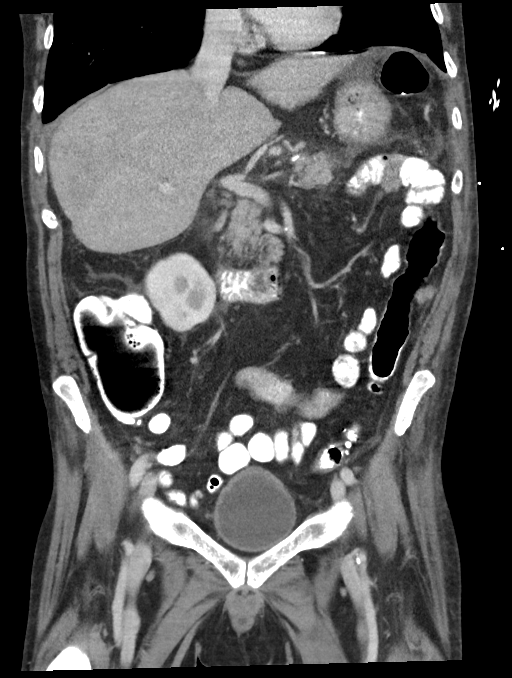
[im 48/87  soft-tissue]
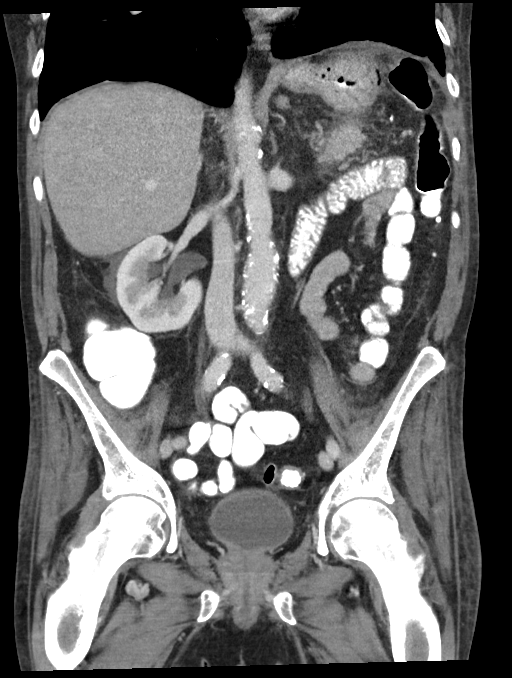

[15 of 46 positions shown; findings below may reference images not displayed]

FINDINGS: Lower chest: Lung bases emphysematous. Small bibasilar pleural
effusions atelectasis.

Hepatobiliary: Cholelithiasis.  Liver normal appearance.

Pancreas: Edema along tail of pancreas, favor due to LEFT upper
quadrant inflammatory process rather than pancreatitis. Remainder of
pancreas normal.

Spleen: Surgically absent

Adrenals/Urinary Tract: Adrenal glands, kidneys, and ureters normal
appearance. Mild thickening of bladder wall though bladder is
underdistended question artifact.

Stomach/Bowel: Normal appendix. HEINZ hernia with anterior wall
of transverse colon extending into a supraumbilical ventral hernia,
without obstruction or wall thickening. Mild sigmoid and descending
colonic diverticulosis. Sigmoid colon is under distended. Remaining
large and small bowel loops unremarkable. Again identified posterior
gastric perforation with extraluminal air and contrast collection
posterior to the stomach, largest focal collection 5.5 x 2.9 x
cm. Additional free fluid extends subdiaphragmatic, adjacent to the
stomach, splenic flexure of colon, and tail of pancreas. Remainder
of stomach unremarkable.

Vascular/Lymphatic: Atherosclerotic calcifications aorta and iliac
arteries. Aneurysmal dilatation of mid to distal abdominal aorta
x 3.2 cm. No adenopathy.

Reproductive: Unremarkable prostate gland and seminal vesicles

Other: Free fluid in pelvis. RIGHT inguinal hernia containing fluid
in the margin of a small bowel loop without obstruction. No
additional free air. Questionable small LEFT inguinal hernia
containing fat.

Musculoskeletal: No acute osseous findings.
IMPRESSION: Again identified posterior gastric perforation likely perforated
ulcer with a small focal contrast and air collection seen posterior
to the stomach at the subdiaphragmatic space measuring 5.5 x 2.9 x
2.8 cm.

Small amount of additional fluid extends subdiaphragmatic, adjacent
to splenic flexure of colon, stomach, and tail of pancreas.

Small amount of free intraperitoneal fluid without new
intra-abdominal fluid collection or additional free air.

Cholelithiasis.

Small bibasilar pleural effusions and atelectasis greater on LEFT.

Stable aneurysmal dilatation of abdominal aorta 3.2 x 3.0 cm;
recommend follow-up ultrasound every 3 years. This recommendation
follows ACR consensus guidelines: White Paper of the ACR Incidental
Findings Committee II on Vascular Findings. [HOSPITAL] [OV];
[DATE].

Emphysema ([OV]-[OV]).

Aortic Atherosclerosis ([OV]-[OV]).

Aortic aneurysm NOS ([OV]-[OV]).

## 2020-12-10 MED ORDER — DICLOFENAC SODIUM 1 % EX GEL
2.0000 g | Freq: Four times a day (QID) | CUTANEOUS | Status: DC | PRN
  Filled 2020-12-10: qty 100

## 2020-12-10 MED ORDER — IOHEXOL 9 MG/ML PO SOLN
500.0000 mL | ORAL | Status: AC
Start: 2020-12-10 — End: 2020-12-10
  Administered 2020-12-10 (×2): 500 mL via ORAL

## 2020-12-10 MED ORDER — IOHEXOL 350 MG/ML SOLN
75.0000 mL | Freq: Once | INTRAVENOUS | Status: AC | PRN
  Administered 2020-12-10: 75 mL via INTRAVENOUS

## 2020-12-10 MED ORDER — TRAVASOL 10 % IV SOLN
INTRAVENOUS | Status: AC
  Filled 2020-12-10: qty 1020

## 2020-12-10 MED ORDER — SODIUM PHOSPHATES 45 MMOLE/15ML IV SOLN
10.0000 mmol | Freq: Once | INTRAVENOUS | Status: AC
  Administered 2020-12-10: 10 mmol via INTRAVENOUS
  Filled 2020-12-10: qty 3.33

## 2020-12-10 NOTE — Progress Notes (Signed)
   12/10/20 0530  Treat  MEWS Interventions Administered prn meds/treatments  Take Vital Signs  Increase Vital Sign Frequency  Yellow: Q 2hr X 2 then Q 4hr X 2, if remains yellow, continue Q 4hrs  Escalate  MEWS: Escalate Yellow: discuss with charge nurse/RN and consider discussing with provider and RRT  Notify: Charge Nurse/RN  Name of Charge Nurse/RN Notified ICU Charge Nurse  Date Charge Nurse/RN Notified 12/10/20  Time Charge Nurse/RN Notified 0507  Notify: Provider  Provider Name/Title Did not notify provider at this time  Notify: Rapid Response  Name of Rapid Response RN Notified ICU Charge Nurse  Date Rapid Response Notified 12/10/20  Time Rapid Response Notified 0507

## 2020-12-10 NOTE — Progress Notes (Signed)
Patient ID: Marcus [name], male   DOB: 08-16-1960, 60 y.o.   MRN: [id number]     [facility] Day(s): 7.   Interval History: Patient seen and examined, nursing has transitioned him to a yellow MEWS level, for vital sign showing both tachypnea and tachycardia.  Otherwise no acute events or new complaints overnight. Patient reports feeling the same.   No upper abdominal pain.  No pain radiation.  There has been no alleviating or aggravating factor.  Patient denies any nausea or vomiting.  Patient denies any fever or chills.  Vital signs in last 24 hours: [min-max] current  Temp:  [97.4 F (36.3 C)-99.4 F (37.4 C)] 97.5 F (36.4 C) (09/05 1258) Pulse Rate:  [75-120] 95 (09/05 1258) Resp:  [18-25] 20 (09/05 1258) BP: (99-153)/(65-104) 104/72 (09/05 1258) SpO2:  [91 %-100 %] 96 % (09/05 1258) Weight:  [57.2 kg] 57.2 kg (09/05 0500)     Height: '5\' 10"'$  (177.8 cm) Weight: 57.2 kg BMI (Calculated): 18.09   Physical Exam:  Constitutional: alert, cooperative and no distress  Respiratory: breathing non-labored at rest,   Cardiovascular: regular rate and sinus rhythm  Gastrointestinal: soft, non-tender, and non-distended.  Multiple incision and right inguinal hernias, soft, reducible.  Labs:  CBC Latest Ref Rng & Units 12/10/2020 12/09/2020 12/08/2020  WBC 4.0 - 10.5 K/uL 4.1 1.3(LL) 2.2(L)  Hemoglobin 13.0 - 17.0 g/dL 15.2 13.9 13.8  Hematocrit 39.0 - 52.0 % 44.9 44.0 41.6  Platelets 150 - 400 K/uL 20(LL) 56(L) 44(L)   CMP Latest Ref Rng & Units 12/10/2020 12/10/2020 12/09/2020  Glucose 70 - 99 mg/dL - 86 139(H)  BUN 6 - 20 mg/dL - 24(H) 18  Creatinine 0.61 - 1.24 mg/dL - <0.30(L) 0.41(L)  Sodium 135 - 145 mmol/L - 132(L) 137  Potassium 3.5 - 5.1 mmol/L 5.2(H) 6.1(H) 4.8  Chloride 98 - 111 mmol/L - 99 103  CO2 22 - 32 mmol/L - 28 28  Calcium 8.9 - 10.3 mg/dL - 8.2(L) 8.2(L)  Total Protein 6.5 - 8.1 g/dL - 5.6(L) -  Total Bilirubin 0.3 - 1.2 mg/dL - 4.2(H) -  Alkaline Phos 38 -  126 U/L - 311(H) -  AST 15 - 41 U/L - 55(H) -  ALT 0 - 44 U/L - 18 -    Imaging studies:    EXAM: CT ABDOMEN AND PELVIS WITH CONTRAST   TECHNIQUE: Multidetector CT imaging of the abdomen and pelvis was performed using the standard protocol following bolus administration of intravenous contrast. Sagittal and coronal MPR images reconstructed from axial data set.   CONTRAST:  39m OMNIPAQUE IOHEXOL 350 MG/ML SOLN IV. Dilute oral contrast.   COMPARISON:  12/06/2020   FINDINGS: Lower chest: Lung bases emphysematous. Small bibasilar pleural effusions atelectasis.   Hepatobiliary: Cholelithiasis.  Liver normal appearance.   Pancreas: Edema along tail of pancreas, favor due to LEFT upper quadrant inflammatory process rather than pancreatitis. Remainder of pancreas normal.   Spleen: Surgically absent   Adrenals/Urinary Tract: Adrenal glands, kidneys, and ureters normal appearance. Mild thickening of bladder wall though bladder is underdistended question artifact.   Stomach/Bowel: Normal appendix. Richter's hernia with anterior wall of transverse colon extending into a supraumbilical ventral hernia, without obstruction or wall thickening. Mild sigmoid and descending colonic diverticulosis. Sigmoid colon is under distended. Remaining large and small bowel loops unremarkable. Again identified posterior gastric perforation with extraluminal air and contrast collection posterior to the stomach, largest focal collection 5.5 x 2.9 x 2.8 cm. Additional  free fluid extends subdiaphragmatic, adjacent to the stomach, splenic flexure of colon, and tail of pancreas. Remainder of stomach unremarkable.   Vascular/Lymphatic: Atherosclerotic calcifications aorta and iliac arteries. 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 No adenopathy.   Reproductive: Unremarkable prostate gland and seminal vesicles   Other: Free fluid in pelvis. RIGHT inguinal hernia  containing fluid in the margin of a small bowel loop without obstruction. No additional free air. Questionable small LEFT inguinal hernia containing fat.   Musculoskeletal: No acute osseous findings.   IMPRESSION: Again identified posterior gastric perforation likely perforated ulcer with a small focal contrast and air collection seen posterior to the stomach at the subdiaphragmatic space measuring 5.5 x 2.9 x 2.8 cm.   Small amount of additional fluid extends subdiaphragmatic, adjacent to splenic flexure of colon, stomach, and tail of pancreas.   Small amount of free intraperitoneal fluid without new intra-abdominal fluid collection or additional free air.   Cholelithiasis.   Small bibasilar pleural effusions and atelectasis greater on LEFT.   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 This recommendation follows ACR consensus guidelines: White Paper of the ACR Incidental Findings Committee II on Vascular Findings. J Am Coll Radiol 2013; 10:789-794.   Emphysema (ICD10-J43.9).   Aortic Atherosclerosis (ICD10-I70.0).   Aortic aneurysm NOS (ICD10-I71.9).     Electronically Signed   By: Lavonia Dana M.D.   On: 12/10/2020 13:18  Assessment/Plan:  60 y.o. male with gastric perforation, complicated by pertinent comorbidities including COPD, Felty syndrome, chronic prednisone, prior opiate bleeding, chronic neutropenia.  Patient had some tachycardia and increased respiratory rate c/w possible clinical deterioration.  Despite having no c/o worsening pain.   There has been no fever.   MEWS status changed from yellow this morning to red, and back to green again.    Elevated total bilirubin noted, no evidence of common bile duct dilatation on CT.  Patient continues with severe thrombocytopenia.   We will appreciate hematology recommendation and optimization.    We will tentatively post robotic assisted  laparoscopic repair of gastric perforation, possible open. Further discussion with team, specifically heme-onc and patient to come. We will continue to follow closely.  Ronny Bacon, M.D., [facility]  12/10/2020 ; 1:26 PM

## 2020-12-10 NOTE — Progress Notes (Signed)
PROGRESS NOTE    SRIVATSA MASHEK  [id number] DOB: 1961-01-05 DOA: 12/02/2020 PCP: [name], FNP   Brief Narrative: Marcus [name] is a 60 y.o. male with history of COPD, rheumatoid arthritis, Felty syndrome, chronic respiratory failure on 2 L of oxygen, tobacco use, history of MRSA pneumonia.  Patient presented secondary to shortness of breath and was found to have gastric perforation with concern for intra-abdominal abscess.  Patient was started empiric antibiotics and general surgery was consulted.  Conservative management initially.  Patient started on TPN for nutrition.  During admission, patient developed worsening neutropenia in setting of underlying Felty syndrome requiring initiation of Granix by hematology/oncology.  He also developed significant thrombocytopenia without bleeding of unknown etiology.  HIT panel was negative.   Assessment & Plan:   Principal Problem:   Gastric perforation (HCC) Active Problems:   Felty syndrome (HCC)   Intra-abdominal abscess (HCC)   Thrombocytopenia (HCC)   Neutropenia (HCC)   Abdominal aortic aneurysm (AAA) ([place])   Sepsis Present on admission secondary to perforated gastric ulcer and underlying abdominal abscess/infection. Blood cultures with no growth x5 days.  Perforated gastric ulcer Consent.  General surgery consulted and are managing conservatively for now. -General surgery recommendations: CT abdomen pelvis today  Intra-abdominal abscess In setting of perforated gastric ulcer.  Patient started empirically on antibiotics.  Currently on meropenem.  Infectious disease on board. -She is recommendation: Meropenem IV  Felty syndrome Neutropenia Rheumatoid arthritis S/p splenectomy Hematolog/oncology on board. Patient's neutropenia treated with Granix (8/29 > 8/31) with improvement of ANC from 200 to 2700. Recurrent worsening ANC down to 500 on 9/4 with repeat Granix treatment (9/4 > -Hematology/oncology recommendations: Granix  for ANC less than 1500  Acute thrombocytopenia Baseline platelets of 156.  Platelets of 100,000 on admission with continued downtrend and acutely worsened to 20,000 today. No evidence of bleeding. Mild concern for HIT; panel sent and does not support diagnosis of HIT. Lovenox discontinued.  Possible concern for thrombocytopenia could have been related to cefepime which has also been discontinued. -Trend CBC -SCDs  Hyperbilirubinemia Elevated alkaline phosphatase Elevated AST Concerning for possible biliary pathology.  CT abdomen/pelvis today.  Abdominal pain is very specific for biliary disease at this time. -Follow-up CT abdomen/pelvis -Obtain direct bilirubin  COPD Stable. -Continue Dulera twice daily and albuterol as needed  Hypokalemia Repleted.  Resolved.  Hyperkalemia Patient currently on TPN.  Potassium 6.1 this morning.  Obtain repeat potassium which is improved to 5.2.  Abdominal aortic aneurysm 3.2 x 3.0 cm. Recommendation for follow-up ultrasound every 3 years   DVT prophylaxis: SCDs Code Status:   Code Status: Full Code Family Communication: None Disposition Plan: Discharge likely in several days pending general surgery recommendations for gastric perforation   Consultants:  General surgery Infectious disease Medical oncology  Procedures:  None  Antimicrobials: Zosyn IV Flagyl IV Cefepime IV Ertapenem IV Meropenem IV   Subjective: No issues this morning. Drinking contrast for scheduled CT scan  Objective: Vitals:   12/10/20 0500 12/10/20 0507 12/10/20 0644 12/10/20 0746  BP:  (!) 137/91 113/76 115/78  Pulse:  (!) 109 (!) 117 (!) 115  Resp:  (!) 25 (!) 24 18  Temp:  99.4 F (37.4 C) 98.1 F (36.7 C) 98 F (36.7 C)  TempSrc:  Oral Oral Oral  SpO2:  94% 92% 94%  Weight: 57.2 kg     Height:        Intake/Output Summary (Last 24 hours) at 12/10/2020 [id number] Last data filed  at 12/10/2020 0512 Gross per 24 hour  Intake 2383.72 ml  Output 800 ml   Net 1583.72 ml   Filed Weights   12/07/20 1634 12/09/20 0545 12/10/20 0500  Weight: 58.3 kg 55.9 kg 57.2 kg    Examination:  General exam: Appears calm and comfortable and in no acute distress. Conversant Respiratory: Clear to auscultation. Respiratory effort normal with no intercostal retractions or use of accessory muscles Cardiovascular: S1 & S2 heard, RRR. No murmurs, rubs, gallops or clicks. No edema Gastrointestinal system: Abdomen is nondistended, soft and tender in epigastric/LUQ areas. Upper abdominal hernia noted that is reducible. Normal bowel sounds heard. Neurologic: No focal neurological deficits Musculoskeletal: No calf tenderness Skin: No cyanosis. No new rashes Psychiatry: Alert. Memory intact. Mood & affect appropriate    Data Reviewed: I have personally reviewed following labs and imaging studies  CBC Lab Results  Component Value Date   WBC 4.1 12/10/2020   RBC 5.53 12/10/2020   HGB 15.2 12/10/2020   HCT 44.9 12/10/2020   MCV 81.2 12/10/2020   MCH 27.5 12/10/2020   PLT 20 (LL) 12/10/2020   MCHC 33.9 12/10/2020   RDW 20.7 (H) 12/10/2020   LYMPHSABS 0.7 12/10/2020   MONOABS 0.6 12/10/2020   EOSABS 0.0 12/10/2020   BASOSABS 0.1 [id number]     Last metabolic panel Lab Results  Component Value Date   NA 132 (L) 12/10/2020   K 6.1 (H) 12/10/2020   CL 99 12/10/2020   CO2 28 12/10/2020   BUN 24 (H) 12/10/2020   CREATININE <0.30 (L) 12/10/2020   GLUCOSE 86 12/10/2020   GFRNONAA NOT CALCULATED 12/10/2020   GFRAA >60 11/30/2019   CALCIUM 8.2 (L) 12/10/2020   PHOS 2.3 (L) 12/10/2020   PROT 5.6 (L) 12/10/2020   ALBUMIN 2.3 (L) 12/10/2020   BILITOT 4.2 (H) 12/10/2020   ALKPHOS 311 (H) 12/10/2020   AST 55 (H) 12/10/2020   ALT 18 12/10/2020   ANIONGAP 5 12/10/2020    CBG (last 3)  Recent Labs    12/09/20 1742 12/10/20 0107 12/10/20 0509  GLUCAP 120* 132* 110*     GFR: CrCl cannot be calculated (This lab value cannot be used to calculate  CrCl because it is not a number: <0.30).  Coagulation Profile: Recent Labs  Lab 12/07/20 1020  INR 1.7*    Recent Results (from the past 240 hour(s))  Blood culture (routine x 2)     Status: None   Collection Time: 12/02/20  5:32 PM   Specimen: BLOOD  Result Value Ref Range Status   Specimen Description BLOOD LAC  Final   Special Requests   Final    BOTTLES DRAWN AEROBIC AND ANAEROBIC Blood Culture adequate volume   Culture   Final    NO GROWTH 5 DAYS Performed at [facility], [address] [place], [place] [address]    Report Status 12/07/2020 FINAL  Final  Resp Panel by RT-PCR (Flu A&B, Covid) Nasopharyngeal Swab     Status: None   Collection Time: 12/02/20 11:35 PM   Specimen: Nasopharyngeal Swab; Nasopharyngeal(NP) swabs in vial transport medium  Result Value Ref Range Status   SARS Coronavirus 2 by RT PCR NEGATIVE NEGATIVE Final    Comment: (NOTE) SARS-CoV-2 target nucleic acids are NOT DETECTED.  The SARS-CoV-2 RNA is generally detectable in upper respiratory specimens during the acute phase of infection. The lowest concentration of SARS-CoV-2 viral copies this assay can detect is 138 copies/mL. A negative result does not preclude SARS-Cov-2 infection  and should not be used as the sole basis for treatment or other patient management decisions. A negative result may occur with  improper specimen collection/handling, submission of specimen other than nasopharyngeal swab, presence of viral mutation(s) within the areas targeted by this assay, and inadequate number of viral copies(<138 copies/mL). A negative result must be combined with clinical observations, patient history, and epidemiological information. The expected result is Negative.  Fact Sheet for Patients:  EntrepreneurPulse.com.au  Fact Sheet for Healthcare Providers:  IncredibleEmployment.be  This test is no t yet approved or cleared by the [place] FDA  and  has been authorized for detection and/or diagnosis of SARS-CoV-2 by FDA under an Emergency Use Authorization (EUA). This EUA will remain  in effect (meaning this test can be used) for the duration of the COVID-19 declaration under Section 564(b)(1) of the Act, 21 U.S.C.section 360bbb-3(b)(1), unless the authorization is terminated  or revoked sooner.       Influenza A by PCR NEGATIVE NEGATIVE Final   Influenza B by PCR NEGATIVE NEGATIVE Final    Comment: (NOTE) The Xpert Xpress SARS-CoV-2/FLU/RSV plus assay is intended as an aid in the diagnosis of influenza from Nasopharyngeal swab specimens and should not be used as a sole basis for treatment. Nasal washings and aspirates are unacceptable for Xpert Xpress SARS-CoV-2/FLU/RSV testing.  Fact Sheet for Patients: EntrepreneurPulse.com.au  Fact Sheet for Healthcare Providers: IncredibleEmployment.be  This test is not yet approved or cleared by the [place] FDA and has been authorized for detection and/or diagnosis of SARS-CoV-2 by FDA under an Emergency Use Authorization (EUA). This EUA will remain in effect (meaning this test can be used) for the duration of the COVID-19 declaration under Section 564(b)(1) of the Act, 21 U.S.C. section 360bbb-3(b)(1), unless the authorization is terminated or revoked.  Performed at [facility], [address] [place], [place] [address]   Blood culture (routine x 2)     Status: None   Collection Time: 12/02/20 11:43 PM   Specimen: BLOOD  Result Value Ref Range Status   Specimen Description BLOOD BLOOD LEFT HAND  Final   Special Requests   Final    BOTTLES DRAWN AEROBIC AND ANAEROBIC Blood Culture adequate volume   Culture   Final    NO GROWTH 5 DAYS Performed at [facility], [address], [place], [place] [address]    Report Status 12/07/2020 FINAL  Final        Radiology Studies: No results  found.      Scheduled Meds:  Chlorhexidine Gluconate Cloth  6 each Topical Daily   diclofenac Sodium  2 g Topical QID   insulin aspart  0-9 Units Subcutaneous Q6H   mometasone-formoterol  2 puff Inhalation BID   multivitamin with minerals  1 tablet Oral Daily   nicotine  21 mg Transdermal Daily   pantoprazole (PROTONIX) IV  40 mg Intravenous BID   predniSONE  10 mg Oral BID   sodium chloride flush  10-40 mL Intracatheter Q12H   Tbo-filgastrim (GRANIX) SQ  300 mcg Subcutaneous Daily   Continuous Infusions:  sodium chloride Stopped (12/10/20 0502)   meropenem (MERREM) IV 200 mL/hr at 12/10/20 0512   TPN ADULT (ION) 85 mL/hr at 12/10/20 0512     LOS: 7 days     Cordelia Poche, MD [facility] 12/10/2020, 7:47 AM  If 7PM-7AM, please contact night-coverage [email or web address]

## 2020-12-10 NOTE — Progress Notes (Signed)
Red MEWS. Dr Lonny Prude notified. See MEWS charting

## 2020-12-10 NOTE — Progress Notes (Signed)
   12/10/20 0507  Assess: MEWS Score  Temp 99.4 F (37.4 C)  BP (!) 137/91  Pulse Rate (!) 109  Resp (!) 25  SpO2 94 %  O2 Device Nasal Cannula  O2 Flow Rate (L/min) 3 L/min  Assess: MEWS Score  MEWS Temp 0  MEWS Systolic 0  MEWS Pulse 1  MEWS RR 1  MEWS LOC 0  MEWS Score 2  MEWS Score Color Yellow  Assess: if the MEWS score is Yellow or Red  Were vital signs taken at a resting state? Yes  Focused Assessment Change from prior assessment (see assessment flowsheet)  Does the patient meet 2 or more of the SIRS criteria? Yes  Does the patient have a confirmed or suspected source of infection? Yes  Provider and Rapid Response Notified? Yes (ICU Charge Notified)  MEWS guidelines implemented *See Row Information* Yes  Assess: SIRS CRITERIA  SIRS Temperature  0  SIRS Pulse 1  SIRS Respirations  1  SIRS WBC 0  SIRS Score Sum  2

## 2020-12-10 NOTE — Progress Notes (Signed)
PT Cancellation Note  Patient Details Name: Marcus [name] MRN: [id number] DOB: 1960/06/10   Cancelled Treatment:    Reason Eval/Treat Not Completed: Patient not medically ready PT orders received, chart reviewed. Pt noted to have low platelet count (20), elevated K+ (6.1) with elevated resting HR & RR. Pt not appropriate for exertional activity/PT evaluation at this time. Will f/u as able & as pt is medically appropriate to participate.  Lavone Nian, PT, DPT 12/10/20, 7:36 AM    Waunita Schooner 12/10/2020, 7:35 AM

## 2020-12-10 NOTE — Progress Notes (Addendum)
PHARMACY - TOTAL PARENTERAL NUTRITION CONSULT NOTE   Indication: perforated gastric ulcer  Patient Measurements: Height: '5\' 10"'$  (177.8 cm) Weight: 57.2 kg (126 lb 1.7 oz) IBW/kg (Calculated) : 73 TPN AdjBW (KG): 59.3 Body mass index is 18.09 kg/m. Usual Weight:    Assessment:  admitted multiple times recently with hemoptysis and gastric bleeding s/p EGD and clipping in fundus, with cavitary pneumonia, with seizures.  Has history of drug use and was cocaine positive on one of his admissions.  Has also hx of Felty syndrome with neutropenia.  He has surgical history of splenectomy in 2017 and exploratory laparotomy with Phillip Heal patch repair of a pyloric channel perforated ulcer in 2021  Glucose / Insulin: on prednisone '10mg'$  BID for RA BG 120 > 140, no hx DM Electrolytes: WNL Renal: Scr 0.54 Hepatic: ASt/ALT 32.10  8/30 Intake / Output; MIVF: on NS GI Imaging:perforated gastric ulcer GI Surgeries / Procedures:  ABX: meropenem  Central access: PICC ordered for 9/2 TPN start date: 9/2 if gets PICC  Nutritional Goals: Goal TPN rate is 85 mL/hr (provides 102 g of protein and 2060 kcals per day)  RD Assessment: Estimated Needs Total Energy Estimated Needs: 1900-2200kcal/day Total Protein Estimated Needs: 95-110g/day Total Fluid Estimated Needs: 1.8-2.1L/day  Current Nutrition:  NPO  Plan:  Continue TPN to goal rate: 85 mL/hr at 1800.  -Nutritional components at goal rate, per Liter: Amino acids (Travasol 10%): 50grams Lipids (20% SMOF): 24 grams Dextrose: 15% kCal: 1938/ 24h -Electrolytes in TPN: Na 3mq/L, K 568m/L, Ca 63m56mL, Mg 63mE68m, and Phos 163mm26m. Cl:Ac 1:1 > will take potassium out of the TPN and received Na phos 10 mmol x 1 IV.  - TG are elevated, will decrease lipids slightly.  -Add standard MVI and trace elements to TPN - d/c thiamine '100mg'$  daily, received 3 days (start 12/07/20) -Initiate Sensitive q6h SSI and adjust as needed. On oral prednisone, no hx DM   -stop MIVF.  -Monitor TPN labs every 3 days at start then on Mon/Thurs, [id number], BCPS 12/10/2020,11:23 AM

## 2020-12-10 NOTE — Progress Notes (Signed)
Order received from Dr Lonny Prude to renew telemetry

## 2020-12-10 NOTE — Progress Notes (Signed)
Date of Admission:  12/02/2020     ID: Marcus [name] is a 60 y.o. male  Active Problems:   Gastric perforation ([place])   Intra-abdominal abscess ([place])   Thrombocytopenia ([place])    Subjective: C/o abdominal pain. Pointing to the umbilical /epigastric area No fever  Medications:   Chlorhexidine Gluconate Cloth  6 each Topical Daily   insulin aspart  0-9 Units Subcutaneous Q6H   mometasone-formoterol  2 puff Inhalation BID   multivitamin with minerals  1 tablet Oral Daily   nicotine  21 mg Transdermal Daily   pantoprazole (PROTONIX) IV  40 mg Intravenous BID   predniSONE  10 mg Oral BID   sodium chloride flush  10-40 mL Intracatheter Q12H   Tbo-filgastrim (GRANIX) SQ  300 mcg Subcutaneous Daily    Objective: Vital signs in last 24 hours: Temp:  [97.4 F (36.3 C)-99.4 F (37.4 C)] 97.5 F (36.4 C) (09/05 1258) Pulse Rate:  [75-120] 95 (09/05 1258) Resp:  [18-25] 20 (09/05 1258) BP: (99-153)/(65-104) 104/72 (09/05 1258) SpO2:  [91 %-100 %] 96 % (09/05 1258) Weight:  [57.2 kg] 57.2 kg (09/05 0500)  PHYSICAL EXAM:  General: No distress. Not septic but weak Head: Normocephalic, without obvious abnormality, atraumatic. Eyes: Conjunctivae clear, anicteric sclerae. Pupils are equal ENT Nares normal. No drainage or sinus tenderness. Lips, mucosa, and tongue normal. No Thrush Neck: Supple, symmetrical, no adenopathy, thyroid: non tender no carotid bruit and no JVD. Back: No CVA tenderness. Lungs: b/l air entry. Heart: s1s2 Abdomen: Soft, BS present-  Extremities: atraumatic, no cyanosis. No edema. No clubbing Skin: No rashes or lesions. Or bruising Lymph: Cervical, supraclavicular normal. Neurologic: Grossly non-focal  Lab Results Recent Labs    12/09/20 0435 12/09/20 0700 12/10/20 0443 12/10/20 0813  WBC 1.3*  --  4.1  --   HGB 13.9  --  15.2  --   HCT 44.0  --  44.9  --   NA  --  137 132*  --   K  --  4.8 6.1* 5.2*  CL  --  103 99  --   CO2  --  28 28  --    BUN  --  18 24*  --   CREATININE  --  0.41* <0.30*  --    Liver Panel Recent Labs    12/10/20 0443 12/10/20 0813  PROT 5.6*  --   ALBUMIN 2.3*  --   AST 55*  --   ALT 18  --   ALKPHOS 311*  --   BILITOT 4.2*  --   BILIDIR  --  2.2*   Sedimentation Rate No results for input(s): ESRSEDRATE in the last 72 hours. C-Reactive Protein No results for input(s): CRP in the last 72 hours.  Microbiology:  Studies/Results: CT ABDOMEN PELVIS W CONTRAST  Result Date: 12/10/2020 CLINICAL DATA:  Continued RIGHT lower quadrant abdominal pain, suspected abscess/infection EXAM: CT ABDOMEN AND PELVIS WITH CONTRAST TECHNIQUE: Multidetector CT imaging of the abdomen and pelvis was performed using the standard protocol following bolus administration of intravenous contrast. Sagittal and coronal MPR images reconstructed from axial data set. CONTRAST:  60m OMNIPAQUE IOHEXOL 350 MG/ML SOLN IV. Dilute oral contrast. COMPARISON:  12/06/2020 FINDINGS: Lower chest: Lung bases emphysematous. Small bibasilar pleural effusions atelectasis. Hepatobiliary: Cholelithiasis.  Liver normal appearance. Pancreas: Edema along tail of pancreas, favor due to LEFT upper quadrant inflammatory process rather than pancreatitis. Remainder of pancreas normal. Spleen: Surgically absent Adrenals/Urinary Tract: Adrenal glands, kidneys, and ureters normal appearance.  Mild thickening of bladder wall though bladder is underdistended question artifact. Stomach/Bowel: Normal appendix. Richter's hernia with anterior wall of transverse colon extending into a supraumbilical ventral hernia, without obstruction or wall thickening. Mild sigmoid and descending colonic diverticulosis. Sigmoid colon is under distended. Remaining large and small bowel loops unremarkable. Again identified posterior gastric perforation with extraluminal air and contrast collection posterior to the stomach, largest focal collection 5.5 x 2.9 x 2.8 cm. Additional free fluid  extends subdiaphragmatic, adjacent to the stomach, splenic flexure of colon, and tail of pancreas. Remainder of stomach unremarkable. Vascular/Lymphatic: Atherosclerotic calcifications aorta and iliac arteries. 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 No adenopathy. Reproductive: Unremarkable prostate gland and seminal vesicles Other: Free fluid in pelvis. RIGHT inguinal hernia containing fluid in the margin of a small bowel loop without obstruction. No additional free air. Questionable small LEFT inguinal hernia containing fat. Musculoskeletal: No acute osseous findings. IMPRESSION: Again identified posterior gastric perforation likely perforated ulcer with a small focal contrast and air collection seen posterior to the stomach at the subdiaphragmatic space measuring 5.5 x 2.9 x 2.8 cm. Small amount of additional fluid extends subdiaphragmatic, adjacent to splenic flexure of colon, stomach, and tail of pancreas. Small amount of free intraperitoneal fluid without new intra-abdominal fluid collection or additional free air. Cholelithiasis. Small bibasilar pleural effusions and atelectasis greater on LEFT. 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 This recommendation follows ACR consensus guidelines: White Paper of the ACR Incidental Findings Committee II on Vascular Findings. J Am Coll Radiol 2013; 10:789-794. Emphysema (ICD10-J43.9). Aortic Atherosclerosis (ICD10-I70.0). Aortic aneurysm NOS (ICD10-I71.9). Electronically Signed   By: Lavonia Dana M.D.   On: 12/10/2020 13:18     Assessment/Plan: Gastric perforation at the site of prior hemostasis clips with  Posterior gastric perforation with extraluminal air and contrast collection posterior to the stomach, largest focal collection 5.5 x 2.9 x 2.8. Continue meropenem Discussed with surgeon- possible lap surgery tomorrow  Risk is high because of thrombocytopenia    Felty Pancytopenia with severe thrombocytopenia Rheumatoid athritis was on methotrexate H/O GI bleed-  H/O Treated MRSA rt lung pneumonia/abscess Pt has had complicated medical course over the pat few months with multiple admissions === would recommend palliative to see the patient to discuss care goals

## 2020-12-11 ENCOUNTER — Inpatient Hospital Stay: Payer: Medicaid Other | Admitting: Anesthesiology

## 2020-12-11 ENCOUNTER — Encounter
Admission: EM | Disposition: A | Discharge status: Other Acute Inpatient Hospital | Payer: Self-pay | Source: Home / Self Care | Attending: Internal Medicine

## 2020-12-11 ENCOUNTER — Encounter: Payer: Self-pay | Admitting: Family Medicine

## 2020-12-11 DIAGNOSIS — K8020 Calculus of gallbladder without cholecystitis without obstruction: Secondary | ICD-10-CM

## 2020-12-11 DIAGNOSIS — K8010 Calculus of gallbladder with chronic cholecystitis without obstruction: Secondary | ICD-10-CM

## 2020-12-11 DIAGNOSIS — K255 Chronic or unspecified gastric ulcer with perforation: Secondary | ICD-10-CM | POA: Diagnosis not present

## 2020-12-11 DIAGNOSIS — K432 Incisional hernia without obstruction or gangrene: Secondary | ICD-10-CM

## 2020-12-11 DIAGNOSIS — D709 Neutropenia, unspecified: Secondary | ICD-10-CM | POA: Diagnosis not present

## 2020-12-11 DIAGNOSIS — C9480 Other specified leukemias not having achieved remission: Secondary | ICD-10-CM | POA: Diagnosis not present

## 2020-12-11 DIAGNOSIS — D696 Thrombocytopenia, unspecified: Secondary | ICD-10-CM

## 2020-12-11 HISTORY — PX: XI ROBOTIC ASSISTED VENTRAL HERNIA: SHX6789

## 2020-12-11 HISTORY — PX: XI ROBOTIC ASSISTED SMALL BOWEL RESECTION: SHX6872

## 2020-12-11 LAB — TYPE AND SCREEN
ABO/RH(D): A POS
Antibody Screen: NEGATIVE

## 2020-12-11 LAB — COMPREHENSIVE METABOLIC PANEL
ALT: 12 U/L (ref 0–44)
AST: 35 U/L (ref 15–41)
Albumin: 2 g/dL — ABNORMAL LOW (ref 3.5–5.0)
Alkaline Phosphatase: 214 U/L — ABNORMAL HIGH (ref 38–126)
Anion gap: 4 — ABNORMAL LOW (ref 5–15)
BUN: 24 mg/dL — ABNORMAL HIGH (ref 6–20)
CO2: 27 mmol/L (ref 22–32)
Calcium: 7.9 mg/dL — ABNORMAL LOW (ref 8.9–10.3)
Chloride: 100 mmol/L (ref 98–111)
Creatinine, Ser: <0.3 mg/dL — ABNORMAL LOW (ref 0.61–1.24)
Glucose, Bld: 147 mg/dL — ABNORMAL HIGH (ref 70–99)
Potassium: 4.5 mmol/L (ref 3.5–5.1)
Sodium: 131 mmol/L — ABNORMAL LOW (ref 135–145)
Total Bilirubin: 2.7 mg/dL — ABNORMAL HIGH (ref 0.3–1.2)
Total Protein: 5.1 g/dL — ABNORMAL LOW (ref 6.5–8.1)

## 2020-12-11 LAB — CBC WITH DIFFERENTIAL/PLATELET
Abs Immature Granulocytes: 0.27 10*3/uL — ABNORMAL HIGH (ref 0.00–0.07)
Basophils Absolute: 0 10*3/uL (ref 0.0–0.1)
Basophils Relative: 0 %
Eosinophils Absolute: 0 10*3/uL (ref 0.0–0.5)
Eosinophils Relative: 0 %
HCT: 37.7 % — ABNORMAL LOW (ref 39.0–52.0)
Hemoglobin: 12.7 g/dL — ABNORMAL LOW (ref 13.0–17.0)
Immature Granulocytes: 2 %
Lymphocytes Relative: 2 %
Lymphs Abs: 0.3 10*3/uL — ABNORMAL LOW (ref 0.7–4.0)
MCH: 27.6 pg (ref 26.0–34.0)
MCHC: 33.7 g/dL (ref 30.0–36.0)
MCV: 82 fL (ref 80.0–100.0)
Monocytes Absolute: 0.5 10*3/uL (ref 0.1–1.0)
Monocytes Relative: 4 %
Neutro Abs: 10.8 10*3/uL — ABNORMAL HIGH (ref 1.7–7.7)
Neutrophils Relative %: 92 %
Platelets: 21 10*3/uL — CL (ref 150–400)
RBC: 4.6 MIL/uL (ref 4.22–5.81)
RDW: 20.6 % — ABNORMAL HIGH (ref 11.5–15.5)
Smear Review: NORMAL
WBC: 11.9 10*3/uL — ABNORMAL HIGH (ref 4.0–10.5)
nRBC: 1.4 % — ABNORMAL HIGH (ref 0.0–0.2)

## 2020-12-11 LAB — BILIRUBIN, DIRECT: Bilirubin, Direct: 1.5 mg/dL — ABNORMAL HIGH (ref 0.0–0.2)

## 2020-12-11 LAB — CBC
HCT: 33.4 % — ABNORMAL LOW (ref 39.0–52.0)
Hemoglobin: 11.2 g/dL — ABNORMAL LOW (ref 13.0–17.0)
MCH: 27.8 pg (ref 26.0–34.0)
MCHC: 33.5 g/dL (ref 30.0–36.0)
MCV: 82.9 fL (ref 80.0–100.0)
Platelets: 27 10*3/uL — CL (ref 150–400)
RBC: 4.03 MIL/uL — ABNORMAL LOW (ref 4.22–5.81)
RDW: 20.5 % — ABNORMAL HIGH (ref 11.5–15.5)
WBC: 12.2 10*3/uL — ABNORMAL HIGH (ref 4.0–10.5)
nRBC: 1.3 % — ABNORMAL HIGH (ref 0.0–0.2)

## 2020-12-11 LAB — FIBRINOGEN
Fibrinogen: 326 mg/dL (ref 210–475)
Fibrinogen: 290 mg/dL (ref 210–475)

## 2020-12-11 LAB — PROTIME-INR
INR: 1 (ref 0.8–1.2)
INR: 1.1 (ref 0.8–1.2)
Prothrombin Time: 13.4 seconds (ref 11.4–15.2)
Prothrombin Time: 14 seconds (ref 11.4–15.2)

## 2020-12-11 LAB — GLUCOSE, CAPILLARY
Glucose-Capillary: 168 mg/dL — ABNORMAL HIGH (ref 70–99)
Glucose-Capillary: 91 mg/dL (ref 70–99)

## 2020-12-11 LAB — APTT
aPTT: 34 seconds (ref 24–36)
aPTT: 43 seconds — ABNORMAL HIGH (ref 24–36)

## 2020-12-11 LAB — PHOSPHORUS: Phosphorus: 3.8 mg/dL (ref 2.5–4.6)

## 2020-12-11 LAB — PLATELET COUNT: Platelets: 39 10*3/uL — ABNORMAL LOW (ref 150–400)

## 2020-12-11 SURGERY — EXCISION, SMALL INTESTINE, ROBOT-ASSISTED
Anesthesia: General

## 2020-12-11 MED ORDER — MICROFIBRILLAR COLL HEMOSTAT EX POWD
CUTANEOUS | Status: DC | PRN
  Administered 2020-12-11: 3 g via TOPICAL

## 2020-12-11 MED ORDER — OXYMETAZOLINE HCL 0.05 % NA SOLN
NASAL | Status: AC
  Filled 2020-12-11: qty 30

## 2020-12-11 MED ORDER — ROCURONIUM BROMIDE 100 MG/10ML IV SOLN
INTRAVENOUS | Status: DC | PRN
  Administered 2020-12-11 (×2): 30 mg via INTRAVENOUS
  Administered 2020-12-11: 20 mg via INTRAVENOUS
  Administered 2020-12-11: 40 mg via INTRAVENOUS
  Administered 2020-12-11: 30 mg via INTRAVENOUS

## 2020-12-11 MED ORDER — INDOCYANINE GREEN 25 MG IV SOLR
2.5000 mg | Freq: Once | INTRAVENOUS | Status: AC
  Administered 2020-12-11: 2.5 mg via INTRAVENOUS
  Filled 2020-12-11: qty 1

## 2020-12-11 MED ORDER — BUPIVACAINE-EPINEPHRINE 0.25% -1:200000 IJ SOLN
INTRAMUSCULAR | Status: DC | PRN
  Administered 2020-12-11: 30 mL

## 2020-12-11 MED ORDER — ONDANSETRON HCL 4 MG/2ML IJ SOLN
INTRAMUSCULAR | Status: DC | PRN
  Administered 2020-12-11: 4 mg via INTRAVENOUS

## 2020-12-11 MED ORDER — SODIUM CHLORIDE 0.9% IV SOLUTION: Freq: Once | INTRAVENOUS | Status: AC

## 2020-12-11 MED ORDER — PHENYLEPHRINE HCL (PRESSORS) 10 MG/ML IV SOLN
INTRAVENOUS | Status: DC | PRN
  Administered 2020-12-11 (×13): 100 ug via INTRAVENOUS

## 2020-12-11 MED ORDER — VISTASEAL 10 ML SINGLE DOSE KIT
PACK | CUTANEOUS | Status: DC | PRN
Start: 2020-12-11 — End: 2020-12-11
  Administered 2020-12-11: 10 mL via TOPICAL

## 2020-12-11 MED ORDER — TRAVASOL 10 % IV SOLN
INTRAVENOUS | Status: AC
  Filled 2020-12-11: qty 1020

## 2020-12-11 MED ORDER — SUGAMMADEX SODIUM 200 MG/2ML IV SOLN
INTRAVENOUS | Status: DC | PRN
  Administered 2020-12-11: 200 mg via INTRAVENOUS

## 2020-12-11 MED ORDER — METHYLENE BLUE 0.5 % INJ SOLN
INTRAVENOUS | Status: AC
  Filled 2020-12-11: qty 10

## 2020-12-11 MED ORDER — ONDANSETRON HCL 4 MG/2ML IJ SOLN
INTRAMUSCULAR | Status: AC
  Filled 2020-12-11: qty 2

## 2020-12-11 MED ORDER — ALBUMIN HUMAN 5 % IV SOLN
INTRAVENOUS | Status: DC | PRN
Start: 2020-12-11 — End: 2020-12-11

## 2020-12-11 MED ORDER — DEXMEDETOMIDINE (PRECEDEX) IN NS 20 MCG/5ML (4 MCG/ML) IV SYRINGE
PREFILLED_SYRINGE | INTRAVENOUS | Status: DC | PRN
  Administered 2020-12-11 (×4): 4 ug via INTRAVENOUS

## 2020-12-11 MED ORDER — PROPOFOL 10 MG/ML IV BOLUS
INTRAVENOUS | Status: AC
  Filled 2020-12-11: qty 20

## 2020-12-11 MED ORDER — MIDAZOLAM HCL 2 MG/2ML IJ SOLN
INTRAMUSCULAR | Status: DC | PRN
  Administered 2020-12-11: 1 mg via INTRAVENOUS

## 2020-12-11 MED ORDER — LIDOCAINE HCL (PF) 2 % IJ SOLN
INTRAMUSCULAR | Status: AC
  Filled 2020-12-11: qty 5

## 2020-12-11 MED ORDER — KETAMINE HCL 50 MG/5ML IJ SOSY
PREFILLED_SYRINGE | INTRAMUSCULAR | Status: AC
  Filled 2020-12-11: qty 5

## 2020-12-11 MED ORDER — LACTATED RINGERS IV SOLN: INTRAVENOUS | Status: DC | PRN

## 2020-12-11 MED ORDER — KETAMINE HCL 50 MG/ML IJ SOLN
INTRAMUSCULAR | Status: DC | PRN
  Administered 2020-12-11: 20 mg via INTRAMUSCULAR

## 2020-12-11 MED ORDER — ONDANSETRON HCL 4 MG/2ML IJ SOLN: 4.0000 mg | Freq: Once | INTRAMUSCULAR | Status: DC | PRN

## 2020-12-11 MED ORDER — PROPOFOL 10 MG/ML IV BOLUS
INTRAVENOUS | Status: DC | PRN
  Administered 2020-12-11: 80 mg via INTRAVENOUS

## 2020-12-11 MED ORDER — METHYLENE BLUE 0.5 % INJ SOLN
INTRAVENOUS | Status: DC | PRN
  Administered 2020-12-11: 10 mg

## 2020-12-11 MED ORDER — EPHEDRINE SULFATE 50 MG/ML IJ SOLN
INTRAMUSCULAR | Status: DC | PRN
  Administered 2020-12-11 (×2): 5 mg via INTRAVENOUS

## 2020-12-11 MED ORDER — BUPIVACAINE-EPINEPHRINE (PF) 0.25% -1:200000 IJ SOLN
INTRAMUSCULAR | Status: AC
  Filled 2020-12-11: qty 30

## 2020-12-11 MED ORDER — DEXAMETHASONE SODIUM PHOSPHATE 10 MG/ML IJ SOLN
INTRAMUSCULAR | Status: DC | PRN
  Administered 2020-12-11: 10 mg via INTRAVENOUS

## 2020-12-11 MED ORDER — LIDOCAINE HCL (CARDIAC) PF 100 MG/5ML IV SOSY
PREFILLED_SYRINGE | INTRAVENOUS | Status: DC | PRN
  Administered 2020-12-11: 50 mg via INTRAVENOUS

## 2020-12-11 MED ORDER — FENTANYL CITRATE (PF) 250 MCG/5ML IJ SOLN
INTRAMUSCULAR | Status: AC
  Filled 2020-12-11: qty 5

## 2020-12-11 MED ORDER — FENTANYL CITRATE (PF) 100 MCG/2ML IJ SOLN: 25.0000 ug | INTRAMUSCULAR | Status: DC | PRN

## 2020-12-11 MED ORDER — ACETAMINOPHEN 10 MG/ML IV SOLN
INTRAVENOUS | Status: DC | PRN
  Administered 2020-12-11: 1000 mg via INTRAVENOUS

## 2020-12-11 MED ORDER — EPHEDRINE 5 MG/ML INJ
INTRAVENOUS | Status: AC
  Filled 2020-12-11: qty 5

## 2020-12-11 MED ORDER — BUPIVACAINE LIPOSOME 1.3 % IJ SUSP
INTRAMUSCULAR | Status: DC | PRN
  Administered 2020-12-11: 20 mL

## 2020-12-11 MED ORDER — ROCURONIUM BROMIDE 10 MG/ML (PF) SYRINGE
PREFILLED_SYRINGE | INTRAVENOUS | Status: AC
  Filled 2020-12-11: qty 10

## 2020-12-11 MED ORDER — SODIUM CHLORIDE 0.9 % IV SOLN
INTRAVENOUS | Status: DC | PRN
  Administered 2020-12-11: 10 ug/min via INTRAVENOUS

## 2020-12-11 MED ORDER — VISTASEAL 10 ML SINGLE DOSE KIT
PACK | CUTANEOUS | Status: AC
  Filled 2020-12-11: qty 10

## 2020-12-11 MED ORDER — ALBUMIN HUMAN 25 % IV SOLN
INTRAVENOUS | Status: AC
  Filled 2020-12-11: qty 100

## 2020-12-11 MED ORDER — DEXAMETHASONE SODIUM PHOSPHATE 10 MG/ML IJ SOLN
INTRAMUSCULAR | Status: AC
  Filled 2020-12-11: qty 1

## 2020-12-11 MED ORDER — BUPIVACAINE LIPOSOME 1.3 % IJ SUSP
INTRAMUSCULAR | Status: AC
  Filled 2020-12-11: qty 20

## 2020-12-11 MED ORDER — FENTANYL CITRATE (PF) 100 MCG/2ML IJ SOLN
INTRAMUSCULAR | Status: DC | PRN
  Administered 2020-12-11 (×5): 50 ug via INTRAVENOUS

## 2020-12-11 MED ORDER — MIDAZOLAM HCL 2 MG/2ML IJ SOLN
INTRAMUSCULAR | Status: AC
  Filled 2020-12-11: qty 2

## 2020-12-11 MED ORDER — ALBUMIN HUMAN 5 % IV SOLN
INTRAVENOUS | Status: AC
  Filled 2020-12-11: qty 250

## 2020-12-11 MED ORDER — OXYMETAZOLINE HCL 0.05 % NA SOLN
NASAL | Status: DC | PRN
  Administered 2020-12-11: 2 via NASAL

## 2020-12-11 SURGICAL SUPPLY — 124 items
ADH SKN CLS APL DERMABOND .7 (GAUZE/BANDAGES/DRESSINGS) ×1
APL LAPSCP 35 DL APL RGD (MISCELLANEOUS) ×1
APL PRP STRL LF DISP 70% ISPRP (MISCELLANEOUS) ×1
APPLICATOR VISTASEAL 35 (MISCELLANEOUS) ×2 IMPLANT
BAG INFUSER PRESSURE 100CC (MISCELLANEOUS) IMPLANT
BAG SPEC RTRVL LRG 6X4 10 (ENDOMECHANICALS) ×1
BINDER ABDOMINAL  9 SM 30-45 (SOFTGOODS) ×1
BINDER ABDOMINAL 9 SM 30-45 (SOFTGOODS) ×1 IMPLANT
BLADE SURG SZ10 CARB STEEL (BLADE) IMPLANT
BLADE SURG SZ11 CARB STEEL (BLADE) ×2 IMPLANT
BULB RESERV EVAC DRAIN JP 100C (MISCELLANEOUS) ×2 IMPLANT
CANNULA REDUC XI 12-8 STAPL (CANNULA) ×1
CANNULA REDUCER 12-8 DVNC XI (CANNULA) ×1 IMPLANT
CHLORAPREP W/TINT 26 (MISCELLANEOUS) ×2 IMPLANT
CLIP VESOLOCK MED LG 6/CT (CLIP) ×2 IMPLANT
COVER TIP SHEARS 8 DVNC (MISCELLANEOUS) ×1 IMPLANT
COVER TIP SHEARS 8MM DA VINCI (MISCELLANEOUS) ×1
CUP MEDICINE 2OZ PLAST GRAD ST (MISCELLANEOUS) IMPLANT
DECANTER SPIKE VIAL GLASS SM (MISCELLANEOUS) ×2 IMPLANT
DEFOGGER SCOPE WARMER CLEARIFY (MISCELLANEOUS) ×2 IMPLANT
DERMABOND ADVANCED (GAUZE/BANDAGES/DRESSINGS) ×1
DERMABOND ADVANCED .7 DNX12 (GAUZE/BANDAGES/DRESSINGS) ×1 IMPLANT
DRAIN CHANNEL JP 19F (MISCELLANEOUS) ×2 IMPLANT
DRAPE ARM DVNC X/XI (DISPOSABLE) ×4 IMPLANT
DRAPE COLUMN DVNC XI (DISPOSABLE) ×1 IMPLANT
DRAPE DA VINCI XI ARM (DISPOSABLE) ×4
DRAPE DA VINCI XI COLUMN (DISPOSABLE) ×1
DRAPE LEGGINS SURG 28X43 STRL (DRAPES) IMPLANT
DRAPE UNDER BUTTOCK W/FLU (DRAPES) IMPLANT
DRSG TEGADERM 4X4.75 (GAUZE/BANDAGES/DRESSINGS) ×2 IMPLANT
ELECT CAUTERY BLADE 6.4 (BLADE) IMPLANT
ELECT CAUTERY BLADE TIP 2.5 (TIP) ×2
ELECT REM PT RETURN 9FT ADLT (ELECTROSURGICAL) ×2
ELECTRODE CAUTERY BLDE TIP 2.5 (TIP) ×1 IMPLANT
ELECTRODE REM PT RTRN 9FT ADLT (ELECTROSURGICAL) ×1 IMPLANT
GAUZE 4X4 16PLY ~~LOC~~+RFID DBL (SPONGE) IMPLANT
GELPOINT ADV PLATFORM (ENDOMECHANICALS)
GLOVE SURG ORTHO LTX SZ7.5 (GLOVE) ×12 IMPLANT
GLOVE SURG SYN 7.0 (GLOVE) ×4 IMPLANT
GLOVE SURG SYN 7.5  E (GLOVE) ×2
GLOVE SURG SYN 7.5 E (GLOVE) ×2 IMPLANT
GOWN STRL REUS W/ TWL LRG LVL3 (GOWN DISPOSABLE) ×6 IMPLANT
GOWN STRL REUS W/TWL LRG LVL3 (GOWN DISPOSABLE) ×12
GRASPER LAPSCPC 5X45 DSP (INSTRUMENTS) ×2 IMPLANT
GRASPER SUT TROCAR 14GX15 (MISCELLANEOUS) ×2 IMPLANT
HANDLE YANKAUER SUCT BULB TIP (MISCELLANEOUS) ×2 IMPLANT
HEMOSTAT ARISTA ABSORB 3G PWDR (HEMOSTASIS) ×2 IMPLANT
IRRIGATION STRYKERFLOW (MISCELLANEOUS) ×1 IMPLANT
IRRIGATOR STRYKERFLOW (MISCELLANEOUS) ×2
IRRIGATOR SUCT 8 DISP DVNC XI (IRRIGATION / IRRIGATOR) IMPLANT
IRRIGATOR SUCTION 8MM XI DISP (IRRIGATION / IRRIGATOR)
IV NS 1000ML (IV SOLUTION)
IV NS 1000ML BAXH (IV SOLUTION) IMPLANT
IV NS IRRIG 3000ML ARTHROMATIC (IV SOLUTION) IMPLANT
KIT PINK PAD W/HEAD ARE REST (MISCELLANEOUS) ×2 IMPLANT
KIT PINK PAD W/HEAD ARM REST (MISCELLANEOUS) ×1 IMPLANT
LABEL OR SOLS (LABEL) ×4 IMPLANT
MANIFOLD NEPTUNE II (INSTRUMENTS) ×2 IMPLANT
NEEDLE HYPO 22GX1.5 SAFETY (NEEDLE) ×2 IMPLANT
NEEDLE INSUFFLATION 14GA 120MM (NEEDLE) ×2 IMPLANT
NS IRRIG 500ML POUR BTL (IV SOLUTION) ×2 IMPLANT
OBTURATOR OPTICAL STANDARD 8MM (TROCAR) ×1
OBTURATOR OPTICAL STND 8 DVNC (TROCAR) ×1
OBTURATOR OPTICALSTD 8 DVNC (TROCAR) ×1 IMPLANT
PACK COLON CLEAN CLOSURE (MISCELLANEOUS) IMPLANT
PACK LAP CHOLECYSTECTOMY (MISCELLANEOUS) ×2 IMPLANT
PAD PREP 24X41 OB/GYN DISP (PERSONAL CARE ITEMS) IMPLANT
PENCIL ELECTRO HAND CTR (MISCELLANEOUS) ×4 IMPLANT
PLATFORM STD W/COL CELL SVR (ENDOMECHANICALS) IMPLANT
POUCH SPECIMEN RETRIEVAL 10MM (ENDOMECHANICALS) ×2 IMPLANT
RELOAD STAPLER 3.5X45 BLU DVNC (STAPLE) IMPLANT
RELOAD STAPLER 3.5X60 BLU DVNC (STAPLE) IMPLANT
RELOAD STAPLER 4.3X60 GRN DVNC (STAPLE) ×2 IMPLANT
RETRACTOR WOUND ALXS 18CM MED (MISCELLANEOUS) IMPLANT
RTRCTR WOUND ALEXIS O 18CM MED (MISCELLANEOUS)
SEAL CANN UNIV 5-8 DVNC XI (MISCELLANEOUS) ×3 IMPLANT
SEAL XI 5MM-8MM UNIVERSAL (MISCELLANEOUS) ×3
SEALER VESSEL DA VINCI XI (MISCELLANEOUS) ×1
SEALER VESSEL EXT DVNC XI (MISCELLANEOUS) ×1 IMPLANT
SET TUBE SMOKE EVAC HIGH FLOW (TUBING) ×2 IMPLANT
SOLUTION ELECTROLUBE (MISCELLANEOUS) ×2 IMPLANT
SPONGE T-LAP 18X18 ~~LOC~~+RFID (SPONGE) ×2 IMPLANT
SPONGE T-LAP 4X18 ~~LOC~~+RFID (SPONGE) IMPLANT
SPONGE VERSALON 4X4 4PLY (MISCELLANEOUS) ×2 IMPLANT
STAPLER 45 DA VINCI SURE FORM (STAPLE)
STAPLER 45 SUREFORM DVNC (STAPLE) IMPLANT
STAPLER 60 DA VINCI SURE FORM (STAPLE) ×1
STAPLER 60 SUREFORM DVNC (STAPLE) ×1 IMPLANT
STAPLER CANNULA SEAL DVNC XI (STAPLE) ×1 IMPLANT
STAPLER CANNULA SEAL XI (STAPLE) ×1
STAPLER RELOAD 3.5X45 BLU DVNC (STAPLE)
STAPLER RELOAD 3.5X45 BLUE (STAPLE)
STAPLER RELOAD 3.5X60 BLU DVNC (STAPLE)
STAPLER RELOAD 3.5X60 BLUE (STAPLE)
STAPLER RELOAD 4.3X60 GREEN (STAPLE) ×2
STAPLER RELOAD 4.3X60 GRN DVNC (STAPLE) ×2
STAPLER SKIN PROX 35W (STAPLE) IMPLANT
SUT DVC VLOC 90 3-0 CV23 VLT (SUTURE)
SUT ETHILON 3-0 FS-10 30 BLK (SUTURE) ×2
SUT MNCRL 4-0 (SUTURE) ×2
SUT MNCRL 4-0 27XMFL (SUTURE) ×1
SUT MNCRL AB 4-0 PS2 18 (SUTURE) ×2 IMPLANT
SUT SILK 2 0 SH (SUTURE) ×4 IMPLANT
SUT STRATAFIX 0 PDS+ CT-2 23 (SUTURE) ×2
SUT STRATAFIX PDS 30 CT-1 (SUTURE) IMPLANT
SUT V-LOC 90 ABS 3-0 VLT  V-20 (SUTURE)
SUT V-LOC 90 ABS 3-0 VLT V-20 (SUTURE) IMPLANT
SUT VIC AB 3-0 SH 27 (SUTURE) ×2
SUT VIC AB 3-0 SH 27X BRD (SUTURE) ×1 IMPLANT
SUT VICRYL 0 AB UR-6 (SUTURE) ×8 IMPLANT
SUT VLOC 90 2/L VL 12 GS22 (SUTURE) IMPLANT
SUTURE DVC VLC 90 3-0 CV23 VLT (SUTURE) IMPLANT
SUTURE EHLN 3-0 FS-10 30 BLK (SUTURE) ×1 IMPLANT
SUTURE MNCRL 4-0 27XMF (SUTURE) ×1 IMPLANT
SUTURE STRATFX 0 PDS+ CT-2 23 (SUTURE) ×1 IMPLANT
SYR 20ML LL LF (SYRINGE) ×2 IMPLANT
TAPE TRANSPORE STRL 2 31045 (GAUZE/BANDAGES/DRESSINGS) ×2 IMPLANT
TRAY FOLEY MTR SLVR 16FR STAT (SET/KITS/TRAYS/PACK) ×2 IMPLANT
TRAY FOLEY SLVR 16FR LF STAT (SET/KITS/TRAYS/PACK) IMPLANT
TROCAR BALLN GELPORT 12X130M (ENDOMECHANICALS) IMPLANT
TROCAR Z-THREAD FIOS 11X100 BL (TROCAR) IMPLANT
TROCAR Z-THREAD OPTICAL 5X100M (TROCAR) ×2 IMPLANT
TUBING EVAC SMOKE HEATED PNEUM (TUBING) ×2 IMPLANT
WATER STERILE IRR 500ML POUR (IV SOLUTION) IMPLANT

## 2020-12-11 NOTE — Progress Notes (Addendum)
12/11/2020  Subjective: Patient had tachycardia yesterday morning which placed him on MEWS red category.  This was temporary.  He also had been having more pain on Sunday.  He reports his pain was in the epigastric area.  His CMP yesterday showed an elevated total bili to 4.2.  Today he reports his pain is more of this baseline as before.  His tbili went down to 2.7 this morning.  Furthermore, his platelet count is down to 21, stable from yesterday at 20.  His WBC is now increasing and is 11.9 this morning.  Repeat CT scan yesterday showed persistent gastric perforation posteriorly with fluid collection.    Vital signs: Temp:  [97.4 F (36.3 C)-98.8 F (37.1 C)] 97.8 F (36.6 C) (09/06 0731) Pulse Rate:  [74-120] 74 (09/06 0731) Resp:  [16-24] 20 (09/06 0731) BP: (94-118)/(63-77) 95/63 (09/06 0731) SpO2:  [91 %-97 %] 95 % (09/06 0731) Weight:  [59.6 kg] 59.6 kg (09/06 0502)   Intake/Output: 09/05 0701 - 09/06 0700 In: 2441.8 [I.V.:1938.1; IV Piggyback:503.7] Out: 1250 [Urine:1250] Last BM Date: 12/10/20  Physical Exam: Constitutional: No acute distress, mild jaundice Abdomen:  soft, non-distended, with some mild discomfort in epigastric area.  Epigastric hernia is reducible.  Labs:  Recent Labs    12/10/20 0443 12/11/20 0644  WBC 4.1 11.9*  HGB 15.2 12.7*  HCT 44.9 37.7*  PLT 20* 21*   Recent Labs    12/10/20 0443 12/10/20 0813 12/11/20 0644  NA 132*  --  131*  K 6.1* 5.2* 4.5  CL 99  --  100  CO2 28  --  27  GLUCOSE 86  --  147*  BUN 24*  --  24*  CREATININE <0.30*  --  <0.30*  CALCIUM 8.2*  --  7.9*   No results for input(s): LABPROT, INR in the last 72 hours.  Imaging: CT ABDOMEN PELVIS W CONTRAST  Result Date: 12/10/2020 CLINICAL DATA:  Continued RIGHT lower quadrant abdominal pain, suspected abscess/infection EXAM: CT ABDOMEN AND PELVIS WITH CONTRAST TECHNIQUE: Multidetector CT imaging of the abdomen and pelvis was performed using the standard protocol  following bolus administration of intravenous contrast. Sagittal and coronal MPR images reconstructed from axial data set. CONTRAST:  34m OMNIPAQUE IOHEXOL 350 MG/ML SOLN IV. Dilute oral contrast. COMPARISON:  12/06/2020 FINDINGS: Lower chest: Lung bases emphysematous. Small bibasilar pleural effusions atelectasis. Hepatobiliary: Cholelithiasis.  Liver normal appearance. Pancreas: Edema along tail of pancreas, favor due to LEFT upper quadrant inflammatory process rather than pancreatitis. Remainder of pancreas normal. Spleen: Surgically absent Adrenals/Urinary Tract: Adrenal glands, kidneys, and ureters normal appearance. Mild thickening of bladder wall though bladder is underdistended question artifact. Stomach/Bowel: Normal appendix. Richter's hernia with anterior wall of transverse colon extending into a supraumbilical ventral hernia, without obstruction or wall thickening. Mild sigmoid and descending colonic diverticulosis. Sigmoid colon is under distended. Remaining large and small bowel loops unremarkable. Again identified posterior gastric perforation with extraluminal air and contrast collection posterior to the stomach, largest focal collection 5.5 x 2.9 x 2.8 cm. Additional free fluid extends subdiaphragmatic, adjacent to the stomach, splenic flexure of colon, and tail of pancreas. Remainder of stomach unremarkable. Vascular/Lymphatic: Atherosclerotic calcifications aorta and iliac arteries. 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 No adenopathy. Reproductive: Unremarkable prostate gland and seminal vesicles Other: Free fluid in pelvis. RIGHT inguinal hernia containing fluid in the margin of a small bowel loop without obstruction. No additional free air. Questionable small LEFT inguinal hernia containing fat. Musculoskeletal:  No acute osseous findings. IMPRESSION: Again identified posterior gastric perforation likely perforated ulcer with a small focal contrast and air  collection seen posterior to the stomach at the subdiaphragmatic space measuring 5.5 x 2.9 x 2.8 cm. Small amount of additional fluid extends subdiaphragmatic, adjacent to splenic flexure of colon, stomach, and tail of pancreas. Small amount of free intraperitoneal fluid without new intra-abdominal fluid collection or additional free air. Cholelithiasis. Small bibasilar pleural effusions and atelectasis greater on LEFT. 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 This recommendation follows ACR consensus guidelines: White Paper of the ACR Incidental Findings Committee II on Vascular Findings. J Am Coll Radiol 2013; 10:789-794. Emphysema (ICD10-J43.9). Aortic Atherosclerosis (ICD10-I70.0). Aortic aneurysm NOS (ICD10-I71.9). Electronically Signed   By: Lavonia Dana M.D.   On: 12/10/2020 13:18    Assessment/Plan: This is a 60 y.o. male with proximal posterior gastric perforation, non-improving with conservative measures.  --Patient's gastric perforation is not improving, and now I feel he's showing signs of some clinical worsening his his tachycardia yesterday, his lower platelet count, his elevated WBC.  He also has some additional confounding issues with his cholelithiasis and elevated total bili yesterday and today.  He also has an incisional hernia containing transverse colon and he mentioned that it was bulging more on Sunday as well. --Unfortunately this is a complex scenario.  Despite of his comorbidities and low platelet count, I think now we're at the point where he warrants surgical intervention.  Discussed with him the surgical plan of robotic assisted repair of gastric perforation.  I think at the same time, we should perform a cholecystectomy to prevent any CBD obstruction as he would not be a candidate for ERCP given his gastric perforation.  Finally, we should also repair his incisional hernia to prevent the risk of  incarceration/strangulation of his transverse colon.  We would not be addressing his right inguinal hernia as mesh would have to be used for the repair and it's fat containing only and easily reducible.  Risks of bleeding, infection, injury to surrounding structures, need for further procedures, post-op course have been discussed with the patient and he's willing to proceed. --For his low platelet count, I have ordered transfusion of 1 unit of platelets today. --Continue NPO, TPN, IV abx.   Melvyn Neth, [facility]

## 2020-12-11 NOTE — Progress Notes (Signed)
PT Cancellation Note  Patient Details Name: Marcus [name] MRN: [id number] DOB: December 12, 1960   Cancelled Treatment:    Reason Eval/Treat Not Completed: Other (comment).  PT consult received.  Chart reviewed.  Nurse reports plan to give pt platelets soon and then pt going for surgery at 1pm today; d/t this will hold PT at this time and re-attempt PT evaluation at a later date/time as medically appropriate (therapy may require new PT consult s/p surgery).  Leitha Bleak, PT 12/11/20, 9:22 AM

## 2020-12-11 NOTE — Anesthesia Preprocedure Evaluation (Signed)
Anesthesia Evaluation  Patient identified by MRN, date of birth, ID band Patient awake    Reviewed: Allergy & Precautions, H&P , NPO status , Patient's Chart, lab work & pertinent test results, reviewed documented beta blocker date and time   Airway Mallampati: II  TM Distance: >3 FB Neck ROM: full    Dental  (+) Teeth Intact, Poor Dentition, Edentulous Upper   Pulmonary shortness of breath and with exertion, pneumonia, resolved, COPD,  COPD inhaler, Current Smoker,    Pulmonary exam normal        Cardiovascular Exercise Tolerance: Poor +CHF  Normal cardiovascular exam Rhythm:regular Rate:Normal     Neuro/Psych negative neurological ROS  negative psych ROS   GI/Hepatic negative GI ROS, Neg liver ROS,   Endo/Other  negative endocrine ROS  Renal/GU CRFRenal disease  negative genitourinary   Musculoskeletal   Abdominal   Peds  Hematology  (+) Blood dyscrasia, anemia ,   Anesthesia Other Findings Past Medical History: No date: COPD (chronic obstructive pulmonary disease) (HCC) No date: Felty syndrome ([facility]) No date: Hernia, epigastric No date: Pancytopenia ([place]) No date: Seropositive rheumatoid arthritis ([place]) No date: Tobacco use disorder Past Surgical History: 10/01/2020: ESOPHAGOGASTRODUODENOSCOPY (EGD) WITH PROPOFOL; N/A     Comment:  Procedure: ESOPHAGOGASTRODUODENOSCOPY (EGD) WITH               PROPOFOL;  Surgeon: Mauri Pole, MD;  Location:               [facility];  Service: Endoscopy;  Laterality: N/A; 10/01/2020: HEMOSTASIS CLIP PLACEMENT     Comment:  Procedure: HEMOSTASIS CLIP PLACEMENT;  Surgeon:               Mauri Pole, MD;  Location: [facility];                Service: Endoscopy;; 01/17/2020: INCISIONAL HERNIA REPAIR     Comment:  Procedure: HERNIA REPAIR INCISIONAL AND Silvestre Gunner;                Surgeon: Kinsinger, Arta Bruce, MD;  Location: [facility];                Service:  General;; 10/01/2020: Everlean Alstrom ANGIOGRAM SELECTIVE EACH ADDITIONAL VESSEL 10/01/2020: IR ANGIOGRAM SELECTIVE EACH ADDITIONAL VESSEL 10/01/2020: IR ANGIOGRAM VISCERAL SELECTIVE 10/01/2020: IR ANGIOGRAM VISCERAL SELECTIVE 10/01/2020: IR US GUIDE VASC ACCESS RIGHT 01/17/2020: LAPAROTOMY; N/A     Comment:  Procedure: EXPLORATORY LAPAROTOMY;  Surgeon: Kieth Brightly               Arta Bruce, MD;  Location: [facility];  Service: General;                Laterality: N/A; BMI    Body Mass Index: 18.85 kg/m     Reproductive/Obstetrics negative OB ROS                             Anesthesia Physical Anesthesia Plan  ASA: 4  Anesthesia Plan: General ETT   Post-op Pain Management:    Induction: Intravenous  PONV Risk Score and Plan: 3  Airway Management Planned: Video Laryngoscope Planned  Additional Equipment:   Intra-op Plan:   Post-operative Plan: Possible Post-op intubation/ventilation  Informed Consent: I have reviewed the patients History and Physical, chart, labs and discussed the procedure including the risks, benefits and alternatives for the proposed anesthesia with the patient or authorized representative who has indicated his/her understanding and acceptance.  Dental Advisory Given  Plan Discussed with: CRNA  Anesthesia Plan Comments:         Anesthesia Quick Evaluation

## 2020-12-11 NOTE — Anesthesia Procedure Notes (Signed)
Procedure Name: Intubation Date/Time: 12/11/2020 1:34 PM Performed by: Tollie Eth, CRNA Pre-anesthesia Checklist: Patient identified, Patient being monitored, Timeout performed, Emergency Drugs available and Suction available Patient Re-evaluated:Patient Re-evaluated prior to induction Oxygen Delivery Method: Circle system utilized Preoxygenation: Pre-oxygenation with 100% oxygen Induction Type: IV induction Ventilation: Mask ventilation without difficulty Laryngoscope Size: McGraph and 4 Grade View: Grade I Tube type: Oral Tube size: 7.5 mm Number of attempts: 1 Airway Equipment and Method: Stylet and Video-laryngoscopy Placement Confirmation: ETT inserted through vocal cords under direct vision, positive ETCO2 and breath sounds checked- equal and bilateral Secured at: 21 cm Tube secured with: Tape Dental Injury: Teeth and Oropharynx as per pre-operative assessment

## 2020-12-11 NOTE — Progress Notes (Signed)
   12/10/20 0854  Assess: MEWS Score  Temp 98.8 F (37.1 C)  BP 99/67  Pulse Rate (!) 120  Resp (!) 24  SpO2 93 %  O2 Device Nasal Cannula  O2 Flow Rate (L/min) 3 L/min  Assess: MEWS Score  MEWS Temp 0  MEWS Systolic 1  MEWS Pulse 2  MEWS RR 1  MEWS LOC 0  MEWS Score 4  MEWS Score Color Red  Assess: if the MEWS score is Yellow or Red  Were vital signs taken at a resting state? Yes  Focused Assessment No change from prior assessment  Does the patient meet 2 or more of the SIRS criteria? No  Does the patient have a confirmed or suspected source of infection? Yes  Provider and Rapid Response Notified? Yes  MEWS guidelines implemented *See Row Information* Yes  Treat  MEWS Interventions Escalated (See documentation below)  Pain Scale 0-10  Pain Score 4  Pain Type Acute pain  Pain Location Abdomen  Pain Orientation Mid  Pain Descriptors / Indicators Aching  Pain Frequency Intermittent  Pain Onset Gradual  Pain Intervention(s) Repositioned  Take Vital Signs  Increase Vital Sign Frequency  Red: Q 1hr X 4 then Q 4hr X 4, if remains red, continue Q 4hrs  Escalate  MEWS: Escalate Red: discuss with charge nurse/RN and provider, consider discussing with RRT (discussed with ICU charge nurse, Clarise Cruz)  Notify: Charge Nurse/RN  Name of Charge Nurse/RN Notified Rochel Brome (Spoke with ICU charge nurse, Clarise Cruz)  Date Charge Nurse/RN Notified 12/10/20  Time Charge Nurse/RN Notified [id number]  Notify: Provider  Provider Name/Title Dr Lonny Prude  Date Provider Notified 12/10/20  Time Provider Notified [phone]  Notification Type Face-to-face  Notification Reason Critical result  Provider response En route  Date of Provider Response 12/10/20  Time of Provider Response 0915  Notify: Rapid Response  Name of Rapid Response RN Notified Clarise Cruz  Date Rapid Response Notified 12/10/20  Time Rapid Response Notified 0905  Assess: SIRS CRITERIA  SIRS Temperature  0  SIRS Pulse 1  SIRS Respirations  1   SIRS WBC 0  SIRS Score Sum  2  Inserted for Rochel Brome RN

## 2020-12-11 NOTE — Op Note (Addendum)
Procedure Date:  12/11/2020  Pre-operative Diagnosis: Proximal posterior gastric perforation with abscess Cholelithiasis Epigastric Incisional hernia  Post-operative Diagnosis:  Proximal posterior gastric perforation with abscess Cholecystitis Epigastric Incisional hernia  Procedure:   Robotic assisted partial sleeve gastrectomy / wedge resection Robotic assisted cholecystectomy with ICG FireFly cholangiogram Robotic assisted epigastric incisional hernia repair  Surgeon:  Melvyn Neth, MD  Assistant:  Caroleen Hamman, MD.  His assistance was critical due to the complex nature of this surgery, the complexity of this patient in early DIC, for appropriate exposure and gastrectomy portion.  Anesthesia:  General endotracheal  Estimated Blood Loss:  200 ml  Specimens:  Gastric fundus, gallbladder  Complications:  None grossly apparent.  Indications for Procedure:  This is a 60 y.o. male who presents with abdominal pain and workup revealing a proximal posterior gastric perforation.  This did not improve with conservative measures, and repeat CT scan showed persistent perforation with air/fluid collection.  He also was having elevated total bilirubin and abdominal pain, with cholelithiasis noted on his imaging studies.  He also has a known epigastric incisional hernia which has also been causing epigastric abdominal pain.  Discussed with him the need to treat all three components.  The benefits, complications, treatment options, and expected outcomes were discussed with the patient. The risks of bleeding, infection, recurrence of symptoms, failure to resolve symptoms, bile duct damage, bile duct leak, retained common bile duct stone, bowel injury, and need for further procedures were all discussed with the patient and he was willing to proceed.  Description of Procedure: The patient was correctly identified in the preoperative area and brought into the operating room.  The patient was placed  supine with VTE prophylaxis in place.  Appropriate time-outs were performed.  Anesthesia was induced and the patient was intubated.  Foley catheter was placed.  Appropriate antibiotics were infused.  The abdomen was prepped and draped in a sterile fashion. An  infraumbilical incision was made, which was away from the incisional hernia and not the same incision. A cutdown technique was used to enter the abdominal cavity without injury, and a 12 mm robotic port was inserted.  Pneumoperitoneum was obtained with appropriate opening pressures.  Three 8-mm ports were placed in the mid abdomen at the level of the umbilicus under direct visualization.  An assist 5 mm laparoscopic port was placed in the left lower quadrant under direct visualization.  The DaVinci platform was docked, camera targeted, and instruments were placed under direct visualization.  Initial inspection of the abdomen revealed significant adhesions of the transverse colon to the patient's incisional hernia, as well as significant dense and thick adhesions from the liver to the anterior stomach and the splenic flexure of the colon to the greater curvature of the stomach.  Using combination of Vessel sealer and blunt dissection, the adhesions of the transverse colon to the hernia defect were dissected and the colon was freed.  We then proceeded to do the same for the adhesions from the liver to the stomach and from the colon to the stomach.  These adhesions were very friable and considerable time of more than 90 minutes was spent doing this dissection.  We entered the lesser sac at the level of the antrum after dissecting through the omentum and we carried this dissection proximally all the way to the most proximal portion of the gastric fundus.  This allowed [place] to elevated the stomach and expose the posterior perforation.  A small pocket of purulent fluid was encountered  and this was suctioned and irrigated.  An NG tube was passed down into the  stomach and it was felt to be within the body of the stomach.  Three stay sutures were placed around the perforation in order to expose it better.  It was decided to proceed with a partial sleeve gastrectomy or wedge resection to excise the perforation.  Two loads of Surefire stapler 60 mm green loads were used to excise a section of the fundus.  Careful attention was placed to not narrow the proximal stomach.  The specimen was placed in an Endocatch bag and taken out through the umbilical port.  The area was then thoroughly irrigated.  Vistaseal was then sprayed over the staple line and the raw surfaces from our dissection to help with hemostasis.  A 19 Fr. Blake drain was inserted through the assist port and placed along the staple line.    We then proceeded to evaluate the gallbladder.  The gallbladder was identified.  There were some adhesions from the omentum to the gallbladder.  The fundus was grasped and retracted cephalad.  Adhesions were lysed bluntly and with electrocautery. The gallbladder was noted to be edematous.  The infundibulum was grasped and retracted laterally, exposing the peritoneum overlying the gallbladder.  This was incised with electrocautery and extended on either side of the gallbladder.  FireFly cholangiogram was then obtained.  The gallbladder did not fill with ICG and cystic duct was very faint, supporting the diagnosis of cholecystitis.  The cystic duct and cystic artery were carefully dissected with combination of cautery and blunt dissection.  Both were clipped twice proximally and once distally, cutting in between.  The gallbladder was taken from the gallbladder fossa in a retrograde fashion with electrocautery. The gallbladder was placed in an Endocatch bag. The liver bed was inspected and any bleeding was controlled with electrocautery. The right upper quadrant was then inspected again revealing intact clips, no bleeding, and no ductal injury.  The area was thoroughly  irrigated.  The incisional hernia was then visualized.  Again, this was not the same area as the port incision, and the hernia was visualized robotically with the camera.  Prior adhesions had already been taken down.  It was decided to proceed with hernia repair.  There was no significant blood pooling in the LUQ or RUQ.  Given the gastric perforation, it was decided not to use mesh and only close the hernia defect primarily.  A 0 Stratafix suture was inserted via the umbilical port and used to close the hernia defect.  There was no significant bleeding from this.  One final look at the upper abdomen did not reveal any active bleeding.  As a precaution, 3 gram Arista powder was sprayed over the LUQ area.  The DaVinci platform was then undocked.  The pelvis was evaluated and serosanguinous fluid from the surgery was suctioned off.  The 8 mm ports were removed under direct visualization and the 12 mm port was removed.  The Endocatch bag containing the gallbladder was brought out via the umbilical incision. The fascial opening of the umbilical port was closed using 0 vicryl sutures.  Local anesthetic was infused in all incisions and the incisions were closed with 4-0 Monocryl.  The drain was secured using 3-0 Nylon.  The wounds were cleaned and sealed with DermaBond, and the drain was dressed with 4x4 gauze and TegaDerm.  An abdominal binder was then placed.  The patient was emerged from anesthesia and extubated and brought to  the recovery room for further management.  The patient tolerated the procedure well and all counts were correct at the end of the case.   Melvyn Neth, MD

## 2020-12-11 NOTE — Progress Notes (Signed)
Marcus [name]   DOB:11-03-1960   [id number]    Subjective: Increased abdominal pain. His total bili had increased to 4.2 yesterday which improved to 2.7 today. Platelets downtrended from 4s to 20 yesterday then 21 today. WBC increased. [place] increased. Repeat CT scan showed persistent gastric perforation with fluid collection. Episodic tachycardia yesterday. Plan is for him to [facility] robotic assisted repair of gastric perforation and cholecystectomy and repair incisional hernia of his transverse colon. He continues IV antibiotics and TPN. He denies bleeding. Has chronic bruising. Says he feels weak.    Objective:  Vitals:   12/11/20 1115 12/11/20 1135  BP: 114/73 115/77  Pulse: 76 72  Resp: 20 20  Temp: (!) 97.5 F (36.4 C) (!) 97.5 F (36.4 C)  SpO2: 98% 97%     Intake/Output Summary (Last 24 hours) at 12/11/2020 1149 Last data filed at 12/11/2020 1115 Gross per 24 hour  Intake 2864.34 ml  Output 1100 ml  Net 1764.34 ml     Physical Exam Vitals and nursing note reviewed.  Constitutional:      Appearance: He is not ill-appearing.     Comments: Resting in bed. Nurse at bedside. Cryo infusing.   HENT:     Head: Normocephalic and atraumatic.     Mouth/Throat:     Pharynx: No oropharyngeal exudate.  Eyes:     General: No scleral icterus.    Conjunctiva/sclera: Conjunctivae normal.  Cardiovascular:     Rate and Rhythm: Normal rate and regular rhythm.  Pulmonary:     Effort: No respiratory distress.     Breath sounds: No wheezing.     Comments: Diminished bilaterally Abdominal:     General: There is no distension.     Tenderness: There is abdominal tenderness. There is no guarding.     Hernia: A hernia is present.  Musculoskeletal:        General: No tenderness or deformity. Normal range of motion.     Right lower leg: No edema.     Left lower leg: No edema.  Lymphadenopathy:     Cervical: No cervical adenopathy.  Skin:    General: Skin is warm and dry.     Coloration:  Skin is not jaundiced.     Findings: Bruising (upper extremities) present.  Neurological:     Mental Status: He is alert and oriented to person, place, and time. Mental status is at baseline.  Psychiatric:        Mood and Affect: Affect is flat.        Behavior: Behavior is withdrawn.        Judgment: Judgment normal.     Labs:  Lab Results  Component Value Date   WBC 11.9 (H) 12/11/2020   HGB 12.7 (L) 12/11/2020   HCT 37.7 (L) 12/11/2020   MCV 82.0 12/11/2020   PLT 21 (LL) 12/11/2020   NEUTROABS 10.8 (H) 12/11/2020   CMP Latest Ref Rng & Units 12/11/2020 12/10/2020 12/10/2020  Glucose 70 - 99 mg/dL 147(H) - 86  BUN 6 - 20 mg/dL 24(H) - 24(H)  Creatinine 0.61 - 1.24 mg/dL <0.30(L) - <0.30(L)  Sodium 135 - 145 mmol/L 131(L) - 132(L)  Potassium 3.5 - 5.1 mmol/L 4.5 5.2(H) 6.1(H)  Chloride 98 - 111 mmol/L 100 - 99  CO2 22 - 32 mmol/L 27 - 28  Calcium 8.9 - 10.3 mg/dL 7.9(L) - 8.2(L)  Total Protein 6.5 - 8.1 g/dL 5.1(L) - 5.6(L)  Total Bilirubin 0.3 - 1.2 mg/dL 2.7(H) - 4.2(H)  Alkaline Phos 38 - 126 U/L 214(H) - 311(H)  AST 15 - 41 U/L 35 - 55(H)  ALT 0 - 44 U/L 12 - 18     Studies:  CT ABDOMEN PELVIS W CONTRAST  Result Date: 12/10/2020 CLINICAL DATA:  Continued RIGHT lower quadrant abdominal pain, suspected abscess/infection EXAM: CT ABDOMEN AND PELVIS WITH CONTRAST TECHNIQUE: Multidetector CT imaging of the abdomen and pelvis was performed using the standard protocol following bolus administration of intravenous contrast. Sagittal and coronal MPR images reconstructed from axial data set. CONTRAST:  84m OMNIPAQUE IOHEXOL 350 MG/ML SOLN IV. Dilute oral contrast. COMPARISON:  12/06/2020 FINDINGS: Lower chest: Lung bases emphysematous. Small bibasilar pleural effusions atelectasis. Hepatobiliary: Cholelithiasis.  Liver normal appearance. Pancreas: Edema along tail of pancreas, favor due to LEFT upper quadrant inflammatory process rather than pancreatitis. Remainder of pancreas normal.  Spleen: Surgically absent Adrenals/Urinary Tract: Adrenal glands, kidneys, and ureters normal appearance. Mild thickening of bladder wall though bladder is underdistended question artifact. Stomach/Bowel: Normal appendix. Richter's hernia with anterior wall of transverse colon extending into a supraumbilical ventral hernia, without obstruction or wall thickening. Mild sigmoid and descending colonic diverticulosis. Sigmoid colon is under distended. Remaining large and small bowel loops unremarkable. Again identified posterior gastric perforation with extraluminal air and contrast collection posterior to the stomach, largest focal collection 5.5 x 2.9 x 2.8 cm. Additional free fluid extends subdiaphragmatic, adjacent to the stomach, splenic flexure of colon, and tail of pancreas. Remainder of stomach unremarkable. Vascular/Lymphatic: Atherosclerotic calcifications aorta and iliac arteries. 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 No adenopathy. Reproductive: Unremarkable prostate gland and seminal vesicles Other: Free fluid in pelvis. RIGHT inguinal hernia containing fluid in the margin of a small bowel loop without obstruction. No additional free air. Questionable small LEFT inguinal hernia containing fat. Musculoskeletal: No acute osseous findings. IMPRESSION: Again identified posterior gastric perforation likely perforated ulcer with a small focal contrast and air collection seen posterior to the stomach at the subdiaphragmatic space measuring 5.5 x 2.9 x 2.8 cm. Small amount of additional fluid extends subdiaphragmatic, adjacent to splenic flexure of colon, stomach, and tail of pancreas. Small amount of free intraperitoneal fluid without new intra-abdominal fluid collection or additional free air. Cholelithiasis. Small bibasilar pleural effusions and atelectasis greater on LEFT. 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  This recommendation follows ACR consensus guidelines: White Paper of the ACR Incidental Findings Committee II on Vascular Findings. J Am Coll Radiol 2013; 10:789-794. Emphysema (ICD10-J43.9). Aortic Atherosclerosis (ICD10-I70.0). Aortic aneurysm NOS (ICD10-I71.9). Electronically Signed   By: MLavonia DanaM.D.   On: 12/10/2020 13:18    Assessment & Plan:  Thrombocytopenia- HIT panel was negative. Lovenox discontinued. Switched from cefepime. He is now s/p 1 unit of platelets given planned surgery. Platelet count 21 today. Increased bruising of upper extremities but no abnormal bleeding noted.  DIC- suspected secondary to infection. He is now s/p cryo x 2. Fibrinogen pending today. Was previously 66(low) on 12/07/20.  Severe Neutropenia/Felty Syndrome/LGL- s/p granix. ANC improved and now elevated likely related to acute infection. Hold granix. Plan to reinstitute if ANC < 1500.  Gastric Perforation & Multiple Intra-abdominal abscesses- plan for robotic assisted surgery later today.   Will continue to follow.   LVerlon Au NP 12/11/2020  1:16 PM

## 2020-12-11 NOTE — Progress Notes (Signed)
PROGRESS NOTE    [name]  [id number] DOB: [birth year] DOA: 12/02/2020 PCP: [name], FNP   Brief Narrative: Marcus [name] is a 60 y.o. male with history of COPD, rheumatoid arthritis, Felty syndrome, chronic respiratory failure on 2 L of oxygen, tobacco use, history of MRSA pneumonia.  Patient presented secondary to shortness of breath and was found to have gastric perforation with concern for intra-abdominal abscess.  Patient was started empiric antibiotics and general surgery was consulted.  Conservative management initially.  Patient started on TPN for nutrition.  During admission, patient developed worsening neutropenia in setting of underlying Felty syndrome requiring initiation of Granix by hematology/oncology.  He also developed significant thrombocytopenia without bleeding of unknown etiology.  HIT panel was negative. Repeat CT abdomen/pelvis with persistent perforation/fluid collection. General surgery with plan for surgical management 9/6.   Assessment & Plan:   Principal Problem:   Gastric perforation (HCC) Active Problems:   Felty syndrome (HCC)   Intra-abdominal abscess (HCC)   Thrombocytopenia (HCC)   Neutropenia (HCC)   Abdominal aortic aneurysm (AAA) (HCC)   Sepsis (HCC)   Calculus of gallbladder without cholecystitis without obstruction   Incisional hernia, without obstruction or gangrene   Sepsis Present on admission secondary to perforated gastric ulcer and underlying abdominal abscess/infection. Blood cultures with no growth x5 days.  Perforated gastric ulcer Consent.  General surgery consulted and are managing conservatively for now. CT abdomen/pelvis on 9/5 with evidence of persistent perforation in addition to fluid collect. Pain has worsened. Leukocytosis in setting of perforation but complicated by Granix therapy. Afebrile. -General surgery recommendations: Plan for surgery today  Intra-abdominal abscess In setting of perforated gastric ulcer.   Patient started empirically on antibiotics.  Currently on meropenem.  Infectious disease on board. -Infections disease recommendation: Meropenem IV  Felty syndrome Neutropenia Rheumatoid arthritis S/p splenectomy Hematolog/oncology on board. Patient's neutropenia treated with Granix (8/29 > 8/31) with improvement of ANC from 200 to 2700. Recurrent worsening ANC down to 500 on 9/4 with repeat Granix treatment (9/4 > 9/5). ANC of 10,800 today. -Hematology/oncology recommendations: Granix for ANC less than 1500 -Stop Granix  Acute thrombocytopenia Baseline platelets of 156.  Platelets of 100,000 on admission with continued downtrend and acutely worsened to 20,000 today. No evidence of bleeding. Mild concern for HIT; panel sent and does not support diagnosis of HIT. Lovenox discontinued.  Possible concern for thrombocytopenia could have been related to cefepime which has also been discontinued. -Trend CBC -SCDs -Platelets ordered for anticipated surgery today  Hyperbilirubinemia Elevated alkaline phosphatase Elevated AST Concerning for possible biliary pathology. Abdominal pain is very specific for biliary disease at this time.CT abdomen/pelvis without evidence of biliary disease.  Numbers now trending down.  COPD Stable. -Continue Dulera twice daily and albuterol as needed  Hypokalemia Repleted.  Resolved.  Hyperkalemia Patient currently on TPN.  Potassium 6.1 this morning.  Obtain repeat potassium which is improved to 5.2.  Abdominal aortic aneurysm 3.2 x 3.0 cm. Recommendation for follow-up ultrasound every 3 years   DVT prophylaxis: SCDs Code Status:   Code Status: Full Code Family Communication: None Disposition Plan: Discharge likely in several days pending general surgery recommendations for gastric perforation   Consultants:  General surgery Infectious disease Medical oncology  Procedures:  None  Antimicrobials: Zosyn IV Flagyl IV Cefepime IV Ertapenem  IV Meropenem IV   Subjective: Abdominal pain is worsened today.  Objective: Vitals:   12/11/20 0218 12/11/20 0502 12/11/20 0524 12/11/20 0731  BP: 118/75 115/76 110/71  95/63  Pulse: 74 78 76 74  Resp: '20 20 16 20  '$ Temp: 97.7 F (36.5 C) 97.6 F (36.4 C) (!) 97.5 F (36.4 C) 97.8 F (36.6 C)  TempSrc: Oral Oral Oral Oral  SpO2: 97% 95% 95% 95%  Weight:  59.6 kg    Height:        Intake/Output Summary (Last 24 hours) at 12/11/2020 0830 Last data filed at 12/11/2020 0500 Gross per 24 hour  Intake 2441.82 ml  Output 950 ml  Net 1491.82 ml    Filed Weights   12/09/20 0545 12/10/20 0500 12/11/20 0502  Weight: 55.9 kg 57.2 kg 59.6 kg    Examination:  General exam: Appears calm and comfortable Respiratory system: Clear to auscultation. Respiratory effort normal. Cardiovascular system: S1 & S2 heard, RRR. No murmurs, rubs, gallops or clicks. Gastrointestinal system: Abdomen is slightly distended, soft and tender in epigastric/LUQ areas. Normal bowel sounds heard. Central nervous system: Alert and oriented. No focal neurological deficits. Musculoskeletal: No edema. No calf tenderness Skin: No cyanosis. No rashes Psychiatry: Judgement and insight appear normal. Mood & affect appropriate.     Data Reviewed: I have personally reviewed following labs and imaging studies  CBC Lab Results  Component Value Date   WBC 11.9 (H) 12/11/2020   RBC 4.60 12/11/2020   HGB 12.7 (L) 12/11/2020   HCT 37.7 (L) 12/11/2020   MCV 82.0 12/11/2020   MCH 27.6 12/11/2020   PLT 21 (LL) 12/11/2020   MCHC 33.7 12/11/2020   RDW 20.6 (H) 12/11/2020   LYMPHSABS 0.3 (L) 12/11/2020   MONOABS 0.5 12/11/2020   EOSABS 0.0 12/11/2020   BASOSABS 0.0 99991111     Last metabolic panel Lab Results  Component Value Date   NA 131 (L) 12/11/2020   K 4.5 12/11/2020   CL 100 12/11/2020   CO2 27 12/11/2020   BUN 24 (H) 12/11/2020   CREATININE <0.30 (L) 12/11/2020   GLUCOSE 147 (H) 12/11/2020    GFRNONAA NOT CALCULATED 12/11/2020   GFRAA >60 11/30/2019   CALCIUM 7.9 (L) 12/11/2020   PHOS 3.8 12/11/2020   PROT 5.1 (L) 12/11/2020   ALBUMIN 2.0 (L) 12/11/2020   BILITOT 2.7 (H) 12/11/2020   ALKPHOS 214 (H) 12/11/2020   AST 35 12/11/2020   ALT 12 12/11/2020   ANIONGAP 4 (L) 12/11/2020    CBG (last 3)  Recent Labs    12/10/20 1822 12/10/20 2353 12/11/20 0508  GLUCAP 147* 174* 168*      GFR: CrCl cannot be calculated (This lab value cannot be used to calculate CrCl because it is not a number: <0.30).  Coagulation Profile: Recent Labs  Lab 12/07/20 1020  INR 1.7*     Recent Results (from the past 240 hour(s))  Blood culture (routine x 2)     Status: None   Collection Time: 12/02/20  5:32 PM   Specimen: BLOOD  Result Value Ref Range Status   Specimen Description BLOOD LAC  Final   Special Requests   Final    BOTTLES DRAWN AEROBIC AND ANAEROBIC Blood Culture adequate volume   Culture   Final    NO GROWTH 5 DAYS Performed at [facility], [address] [place], [place] [address]    Report Status 12/07/2020 FINAL  Final  Resp Panel by RT-PCR (Flu A&B, Covid) Nasopharyngeal Swab     Status: None   Collection Time: 12/02/20 11:35 PM   Specimen: Nasopharyngeal Swab; Nasopharyngeal(NP) swabs in vial transport medium  Result Value Ref  Range Status   SARS Coronavirus 2 by RT PCR NEGATIVE NEGATIVE Final    Comment: (NOTE) SARS-CoV-2 target nucleic acids are NOT DETECTED.  The SARS-CoV-2 RNA is generally detectable in upper respiratory specimens during the acute phase of infection. The lowest concentration of SARS-CoV-2 viral copies this assay can detect is 138 copies/mL. A negative result does not preclude SARS-Cov-2 infection and should not be used as the sole basis for treatment or other patient management decisions. A negative result may occur with  improper specimen collection/handling, submission of specimen other than nasopharyngeal swab, presence  of viral mutation(s) within the areas targeted by this assay, and inadequate number of viral copies(<138 copies/mL). A negative result must be combined with clinical observations, patient history, and epidemiological information. The expected result is Negative.  Fact Sheet for Patients:  EntrepreneurPulse.com.au  Fact Sheet for Healthcare Providers:  IncredibleEmployment.be  This test is no t yet approved or cleared by the [place] FDA and  has been authorized for detection and/or diagnosis of SARS-CoV-2 by FDA under an Emergency Use Authorization (EUA). This EUA will remain  in effect (meaning this test can be used) for the duration of the COVID-19 declaration under Section 564(b)(1) of the Act, 21 U.S.C.section 360bbb-3(b)(1), unless the authorization is terminated  or revoked sooner.       Influenza A by PCR NEGATIVE NEGATIVE Final   Influenza B by PCR NEGATIVE NEGATIVE Final    Comment: (NOTE) The Xpert Xpress SARS-CoV-2/FLU/RSV plus assay is intended as an aid in the diagnosis of influenza from Nasopharyngeal swab specimens and should not be used as a sole basis for treatment. Nasal washings and aspirates are unacceptable for Xpert Xpress SARS-CoV-2/FLU/RSV testing.  Fact Sheet for Patients: EntrepreneurPulse.com.au  Fact Sheet for Healthcare Providers: IncredibleEmployment.be  This test is not yet approved or cleared by the [place] FDA and has been authorized for detection and/or diagnosis of SARS-CoV-2 by FDA under an Emergency Use Authorization (EUA). This EUA will remain in effect (meaning this test can be used) for the duration of the COVID-19 declaration under Section 564(b)(1) of the Act, 21 U.S.C. section 360bbb-3(b)(1), unless the authorization is terminated or revoked.  Performed at [facility], [address] [place], [place] [address]   Blood culture (routine  x 2)     Status: None   Collection Time: 12/02/20 11:43 PM   Specimen: BLOOD  Result Value Ref Range Status   Specimen Description BLOOD BLOOD LEFT HAND  Final   Special Requests   Final    BOTTLES DRAWN AEROBIC AND ANAEROBIC Blood Culture adequate volume   Culture   Final    NO GROWTH 5 DAYS Performed at [facility], [address], [place], [place] [address]    Report Status 12/07/2020 FINAL  Final  MRSA Next Gen by PCR, Nasal     Status: None   Collection Time: 12/10/20  9:20 PM   Specimen: Nasal Mucosa; Nasal Swab  Result Value Ref Range Status   MRSA by PCR Next Gen NOT DETECTED NOT DETECTED Final    Comment: (NOTE) The GeneXpert MRSA Assay (FDA approved for NASAL specimens only), is one component of a comprehensive MRSA colonization surveillance program. It is not intended to diagnose MRSA infection nor to guide or monitor treatment for MRSA infections. Test performance is not FDA approved in patients less than 64 years old. Performed at [facility], [address], [place], [place] [address]  Radiology Studies: CT ABDOMEN PELVIS W CONTRAST  Result Date: 12/10/2020 CLINICAL DATA:  Continued RIGHT lower quadrant abdominal pain, suspected abscess/infection EXAM: CT ABDOMEN AND PELVIS WITH CONTRAST TECHNIQUE: Multidetector CT imaging of the abdomen and pelvis was performed using the standard protocol following bolus administration of intravenous contrast. Sagittal and coronal MPR images reconstructed from axial data set. CONTRAST:  38m OMNIPAQUE IOHEXOL 350 MG/ML SOLN IV. Dilute oral contrast. COMPARISON:  12/06/2020 FINDINGS: Lower chest: Lung bases emphysematous. Small bibasilar pleural effusions atelectasis. Hepatobiliary: Cholelithiasis.  Liver normal appearance. Pancreas: Edema along tail of pancreas, favor due to LEFT upper quadrant inflammatory process rather than pancreatitis. Remainder of pancreas normal. Spleen: Surgically absent  Adrenals/Urinary Tract: Adrenal glands, kidneys, and ureters normal appearance. Mild thickening of bladder wall though bladder is underdistended question artifact. Stomach/Bowel: Normal appendix. Richter's hernia with anterior wall of transverse colon extending into a supraumbilical ventral hernia, without obstruction or wall thickening. Mild sigmoid and descending colonic diverticulosis. Sigmoid colon is under distended. Remaining large and small bowel loops unremarkable. Again identified posterior gastric perforation with extraluminal air and contrast collection posterior to the stomach, largest focal collection 5.5 x 2.9 x 2.8 cm. Additional free fluid extends subdiaphragmatic, adjacent to the stomach, splenic flexure of colon, and tail of pancreas. Remainder of stomach unremarkable. Vascular/Lymphatic: Atherosclerotic calcifications aorta and iliac arteries. 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 No adenopathy. Reproductive: Unremarkable prostate gland and seminal vesicles Other: Free fluid in pelvis. RIGHT inguinal hernia containing fluid in the margin of a small bowel loop without obstruction. No additional free air. Questionable small LEFT inguinal hernia containing fat. Musculoskeletal: No acute osseous findings. IMPRESSION: Again identified posterior gastric perforation likely perforated ulcer with a small focal contrast and air collection seen posterior to the stomach at the subdiaphragmatic space measuring 5.5 x 2.9 x 2.8 cm. Small amount of additional fluid extends subdiaphragmatic, adjacent to splenic flexure of colon, stomach, and tail of pancreas. Small amount of free intraperitoneal fluid without new intra-abdominal fluid collection or additional free air. Cholelithiasis. Small bibasilar pleural effusions and atelectasis greater on LEFT. 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 This recommendation follows  ACR consensus guidelines: White Paper of the ACR Incidental Findings Committee II on Vascular Findings. J Am Coll Radiol 2013; 10:789-794. Emphysema (ICD10-J43.9). Aortic Atherosclerosis (ICD10-I70.0). Aortic aneurysm NOS (ICD10-I71.9). Electronically Signed   By: MLavonia DanaM.D.   On: 12/10/2020 13:18        Scheduled Meds:  sodium chloride   Intravenous Once   Chlorhexidine Gluconate Cloth  6 each Topical Daily   insulin aspart  0-9 Units Subcutaneous Q6H   mometasone-formoterol  2 puff Inhalation BID   multivitamin with minerals  1 tablet Oral Daily   nicotine  21 mg Transdermal Daily   pantoprazole (PROTONIX) IV  40 mg Intravenous BID   predniSONE  10 mg Oral BID   sodium chloride flush  10-40 mL Intracatheter Q12H   Tbo-filgastrim (GRANIX) SQ  300 mcg Subcutaneous Daily   Continuous Infusions:  meropenem (MERREM) IV 1 g (12/11/20 0512)   TPN ADULT (ION) 85 mL/hr at 12/11/20 0418     LOS: 8 days     RCordelia Poche MD [facility] 12/11/2020, 8:30 AM  If 7PM-7AM, please contact night-coverage [email or web address]

## 2020-12-11 NOTE — Transfer of Care (Signed)
Immediate Anesthesia Transfer of Care Note  Patient: Marcus [name]  Procedure(s) Performed: XI ROBOTIC ASSISTED Repair of gastric perforation. XI ROBOTIC ASSISTED VENTRAL HERNIA XI ROBOTIC ASSISTED LAPAROSCOPIC CHOLECYSTECTOMY  Patient Location: PACU  Anesthesia Type:General  Level of Consciousness: drowsy  Airway & Oxygen Therapy: Patient Spontanous Breathing and Patient connected to face mask oxygen  Post-op Assessment: Report given to RN and Post -op Vital signs reviewed and stable  Post vital signs: Reviewed and stable  Last Vitals:  Vitals Value Taken Time  BP 98/67 12/11/20 1747  Temp    Pulse 92 12/11/20 1755  Resp 17 12/11/20 1755  SpO2 98 % 12/11/20 1755  Vitals shown include unvalidated device data.  Last Pain:  Vitals:   12/11/20 1216  TempSrc: Temporal  PainSc: 0-No pain      Patients Stated Pain Goal: 0 ([id number])  Complications: No notable events documented.

## 2020-12-11 NOTE — Anesthesia Postprocedure Evaluation (Signed)
Anesthesia Post Note  Patient: Marcus [name]  Procedure(s) Performed: XI ROBOTIC ASSISTED Repair of gastric perforation. XI ROBOTIC ASSISTED VENTRAL HERNIA XI ROBOTIC ASSISTED LAPAROSCOPIC CHOLECYSTECTOMY  Patient location during evaluation: PACU Anesthesia Type: General Level of consciousness: awake and alert Pain management: pain level controlled Vital Signs Assessment: post-procedure vital signs reviewed and stable Respiratory status: spontaneous breathing, nonlabored ventilation, respiratory function stable and patient connected to nasal cannula oxygen Cardiovascular status: blood pressure returned to baseline and stable Postop Assessment: no apparent nausea or vomiting Anesthetic complications: no   No notable events documented.   Last Vitals:  Vitals:   12/11/20 1904 12/11/20 2010  BP: 98/68 104/68  Pulse: 88 82  Resp: 18 (!) 22  Temp: 36.7 C (!) 36.3 C  SpO2: 95% 98%    Last Pain:  Vitals:   12/11/20 2112  TempSrc:   PainSc: 10-Worst pain ever                 Martha Clan

## 2020-12-11 NOTE — Progress Notes (Signed)
PHARMACY - TOTAL PARENTERAL NUTRITION CONSULT NOTE   Indication: perforated gastric ulcer  Patient Measurements: Height: '5\' 10"'$  (177.8 cm) Weight: 59.6 kg (131 lb 6.3 oz) IBW/kg (Calculated) : 73 TPN AdjBW (KG): 59.3 Body mass index is 18.85 kg/m.  Assessment:  admitted multiple times recently with hemoptysis and gastric bleeding s/p EGD and clipping in fundus, with cavitary pneumonia, with seizures.  Has history of drug use and was cocaine positive on one of his admissions.  Has also hx of Felty syndrome with neutropenia.  He has surgical history of splenectomy in 2017 and exploratory laparotomy with Phillip Heal patch repair of a pyloric channel perforated ulcer in 2021  Glucose / Insulin: on prednisone '10mg'$  BID for RA BG 135 - 174, no hx DM Electrolytes: WNL Renal: Scr < 1, stable Hepatic: ASt/ALT 32.10  8/30 Intake / Output; MIVF: no MIVF GI Imaging:perforated gastric ulcer GI Surgeries / Procedures: robotic assisted repair of gastric perforation planned for today ABX: meropenem  Central access: 12/07/20 TPN start date: 12/07/20   Nutritional Goals: Goal TPN rate is 85 mL/hr (provides 102 g of protein and 2060 kcals per day)  RD Assessment: Estimated Needs Total Energy Estimated Needs: 1900-2200kcal/day Total Protein Estimated Needs: 95-110g/day Total Fluid Estimated Needs: 1.8-2.1L/day  Current Nutrition:  NPO  Plan:  Continue TPN at goal rate: 85 mL/hr at 1800 (total volume including overfill: 2140 mL)  Nutritional components: Amino acids (Travasol 10%): 102 grams Lipids (20% SMOF): 49 grams Dextrose: 306 grams kCal: 1938/ 24h Electrolytes in TPN: Na 75 mEq/L, K 61mq/L, Ca 551m/L, Mg 71m58mL, and Phos 171m51mL. Cl:Ac 1:1.   Continue Sensitive q6h SSI and adjust as needed. On oral prednisone, no hx DM   Monitor TPN labs every 3 days at start then on Mon/Thurs  RodnDallie PilesarmD, BCPS 12/11/2020,7:11 AM

## 2020-12-12 ENCOUNTER — Inpatient Hospital Stay: Payer: Medicaid Other

## 2020-12-12 ENCOUNTER — Encounter: Payer: Self-pay | Admitting: Surgery

## 2020-12-12 DIAGNOSIS — K651 Peritoneal abscess: Secondary | ICD-10-CM | POA: Diagnosis not present

## 2020-12-12 DIAGNOSIS — K255 Chronic or unspecified gastric ulcer with perforation: Secondary | ICD-10-CM | POA: Diagnosis not present

## 2020-12-12 LAB — CBC WITH DIFFERENTIAL/PLATELET
Abs Immature Granulocytes: 0.15 10*3/uL — ABNORMAL HIGH (ref 0.00–0.07)
Basophils Absolute: 0.1 10*3/uL (ref 0.0–0.1)
Basophils Relative: 1 %
Eosinophils Absolute: 0 10*3/uL (ref 0.0–0.5)
Eosinophils Relative: 0 %
HCT: 29 % — ABNORMAL LOW (ref 39.0–52.0)
Hemoglobin: 9.9 g/dL — ABNORMAL LOW (ref 13.0–17.0)
Immature Granulocytes: 2 %
Lymphocytes Relative: 4 %
Lymphs Abs: 0.4 10*3/uL — ABNORMAL LOW (ref 0.7–4.0)
MCH: 28.8 pg (ref 26.0–34.0)
MCHC: 34.1 g/dL (ref 30.0–36.0)
MCV: 84.3 fL (ref 80.0–100.0)
Monocytes Absolute: 0.4 10*3/uL (ref 0.1–1.0)
Monocytes Relative: 4 %
Neutro Abs: 8.9 10*3/uL — ABNORMAL HIGH (ref 1.7–7.7)
Neutrophils Relative %: 89 %
Platelets: 23 10*3/uL — CL (ref 150–400)
RBC: 3.44 MIL/uL — ABNORMAL LOW (ref 4.22–5.81)
RDW: 20.6 % — ABNORMAL HIGH (ref 11.5–15.5)
Smear Review: NORMAL
WBC: 9.9 10*3/uL (ref 4.0–10.5)
nRBC: 1.6 % — ABNORMAL HIGH (ref 0.0–0.2)

## 2020-12-12 LAB — PREPARE CRYOPRECIPITATE
Unit division: 0
Unit division: 0

## 2020-12-12 LAB — GLUCOSE, CAPILLARY
Glucose-Capillary: 174 mg/dL — ABNORMAL HIGH (ref 70–99)
Glucose-Capillary: 177 mg/dL — ABNORMAL HIGH (ref 70–99)
Glucose-Capillary: 186 mg/dL — ABNORMAL HIGH (ref 70–99)
Glucose-Capillary: 200 mg/dL — ABNORMAL HIGH (ref 70–99)
Glucose-Capillary: 249 mg/dL — ABNORMAL HIGH (ref 70–99)
Glucose-Capillary: 280 mg/dL — ABNORMAL HIGH (ref 70–99)

## 2020-12-12 LAB — BPAM CRYOPRECIPITATE
Blood Product Expiration Date: 202209061518
Blood Product Expiration Date: 202209061518
ISSUE DATE / TIME: 202209061052
ISSUE DATE / TIME: 202209061052
Unit Type and Rh: 6200
Unit Type and Rh: 6200

## 2020-12-12 LAB — COMPREHENSIVE METABOLIC PANEL
ALT: 23 U/L (ref 0–44)
AST: 76 U/L — ABNORMAL HIGH (ref 15–41)
Albumin: 2.2 g/dL — ABNORMAL LOW (ref 3.5–5.0)
Alkaline Phosphatase: 177 U/L — ABNORMAL HIGH (ref 38–126)
Anion gap: 5 (ref 5–15)
BUN: 35 mg/dL — ABNORMAL HIGH (ref 6–20)
CO2: 27 mmol/L (ref 22–32)
Calcium: 7.7 mg/dL — ABNORMAL LOW (ref 8.9–10.3)
Chloride: 100 mmol/L (ref 98–111)
Creatinine, Ser: 0.43 mg/dL — ABNORMAL LOW (ref 0.61–1.24)
GFR, Estimated: >60 mL/min (ref >60)
Glucose, Bld: 274 mg/dL — ABNORMAL HIGH (ref 70–99)
Potassium: 4.6 mmol/L (ref 3.5–5.1)
Sodium: 132 mmol/L — ABNORMAL LOW (ref 135–145)
Total Bilirubin: 3.2 mg/dL — ABNORMAL HIGH (ref 0.3–1.2)
Total Protein: 4.9 g/dL — ABNORMAL LOW (ref 6.5–8.1)

## 2020-12-12 LAB — APTT: aPTT: 39 seconds — ABNORMAL HIGH (ref 24–36)

## 2020-12-12 LAB — PROTIME-INR
INR: 1.1 (ref 0.8–1.2)
Prothrombin Time: 13.7 seconds (ref 11.4–15.2)

## 2020-12-12 LAB — FIBRINOGEN: Fibrinogen: 278 mg/dL (ref 210–475)

## 2020-12-12 IMAGING — DX DG ABD PORTABLE 2V
2 series · 2 of 2 positions shown · non-contrast
Comparison: Radiograph earlier today.  CT [DATE]

CLINICAL DATA: Perforated bowel.  NGT adjustment.

EXAM:
PORTABLE ABDOMEN - 2 VIEW

[abdomen erect]
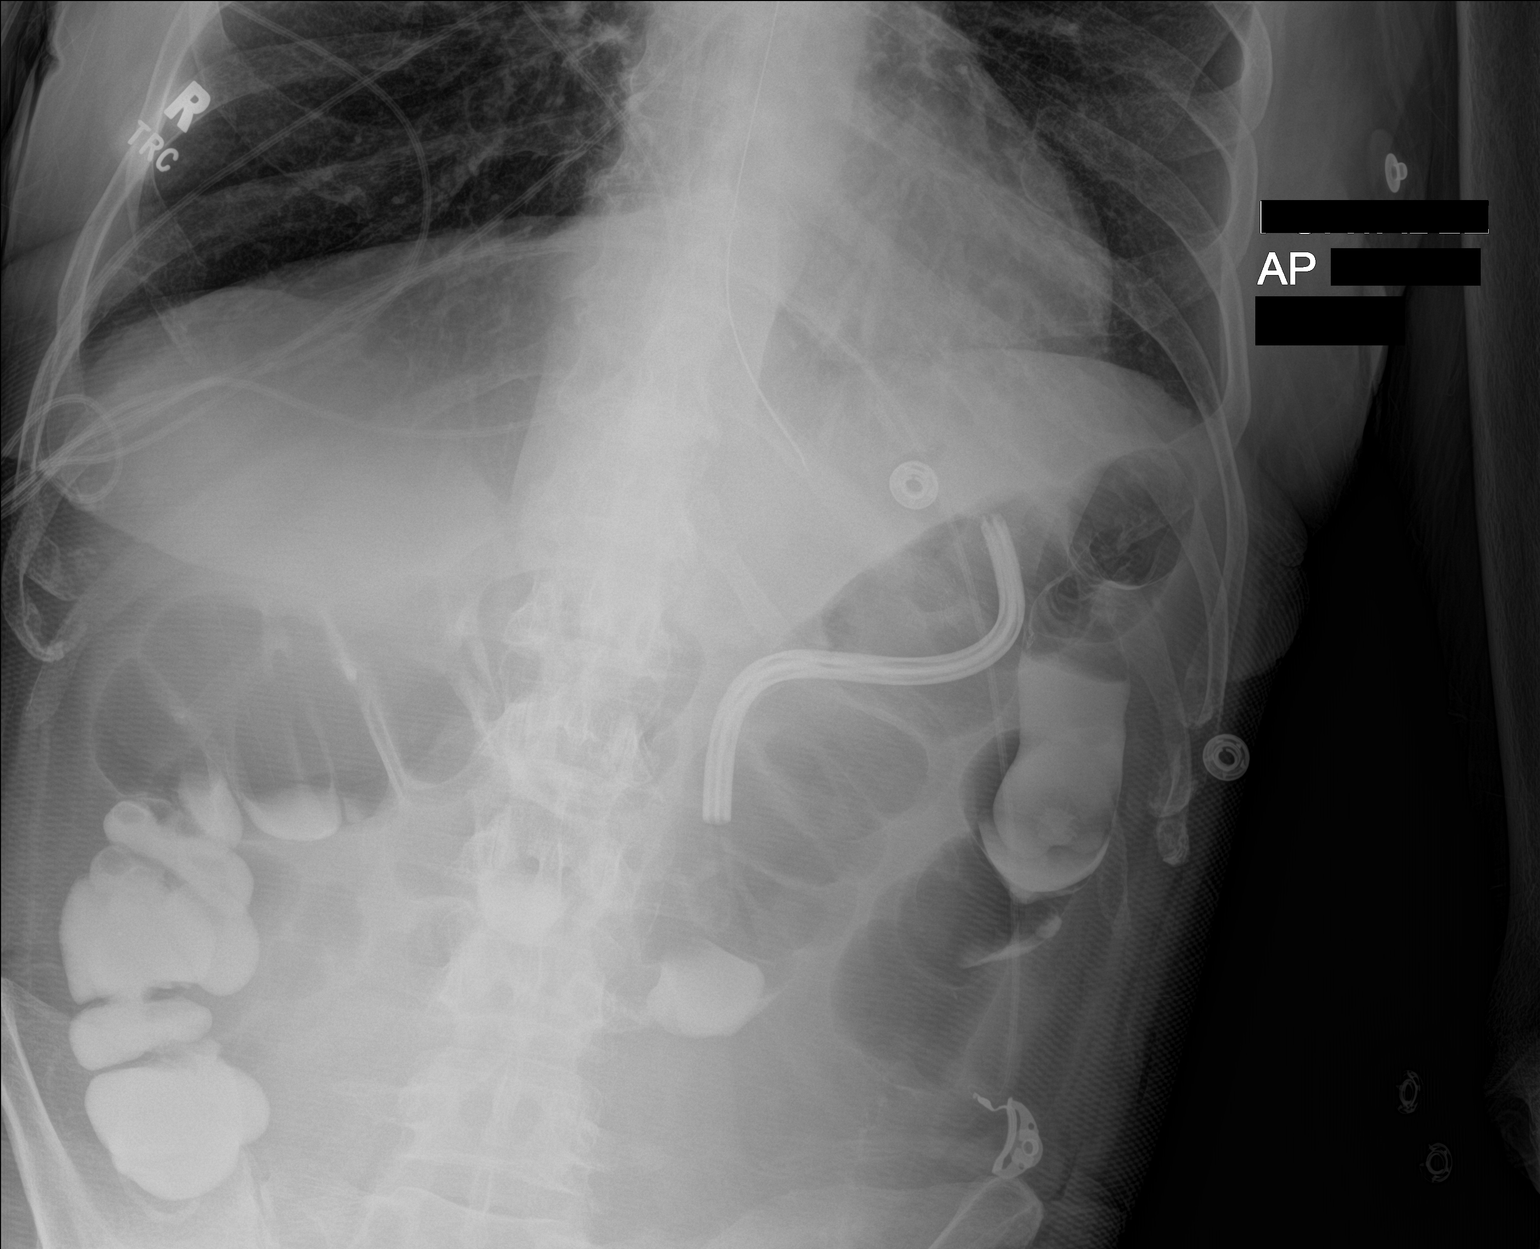

[abdomen supine]
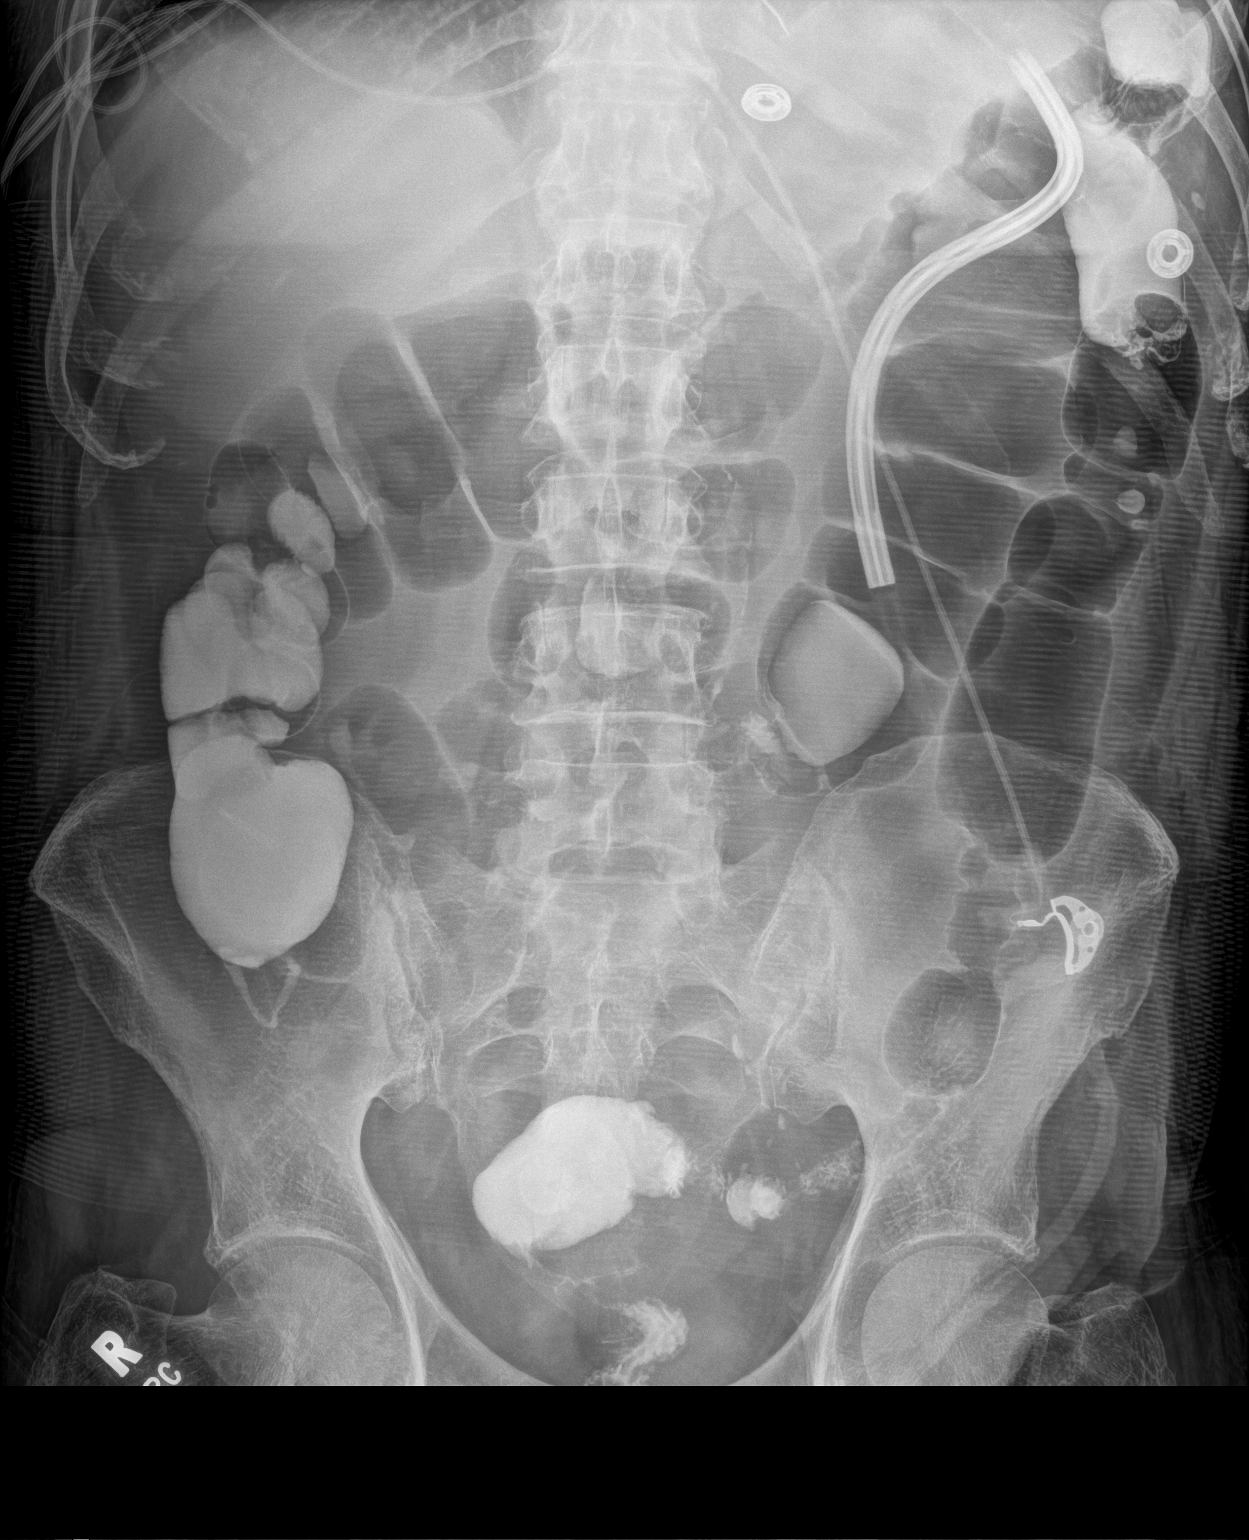

[2 of 2 positions shown; findings below may reference images not displayed]

FINDINGS: The endotracheal tube has been retracted, tip is in the region of
the gastroesophageal junction, side-port in the stomach. Again seen
contrast within the colon. Surgical drain in the left upper
quadrant.
IMPRESSION: Enteric tube tip in the region of the gastroesophageal junction,
side-port in the stomach. Recommend advancement of at least 7-1/2 cm
to place the side-port below the diaphragm.

## 2020-12-12 IMAGING — DX DG ABDOMEN 1V
1 series · 1 of 1 positions shown · non-contrast
Comparison: [DATE]

CLINICAL DATA: NG tube placement

EXAM:
ABDOMEN - 1 VIEW

[abdomen supine]
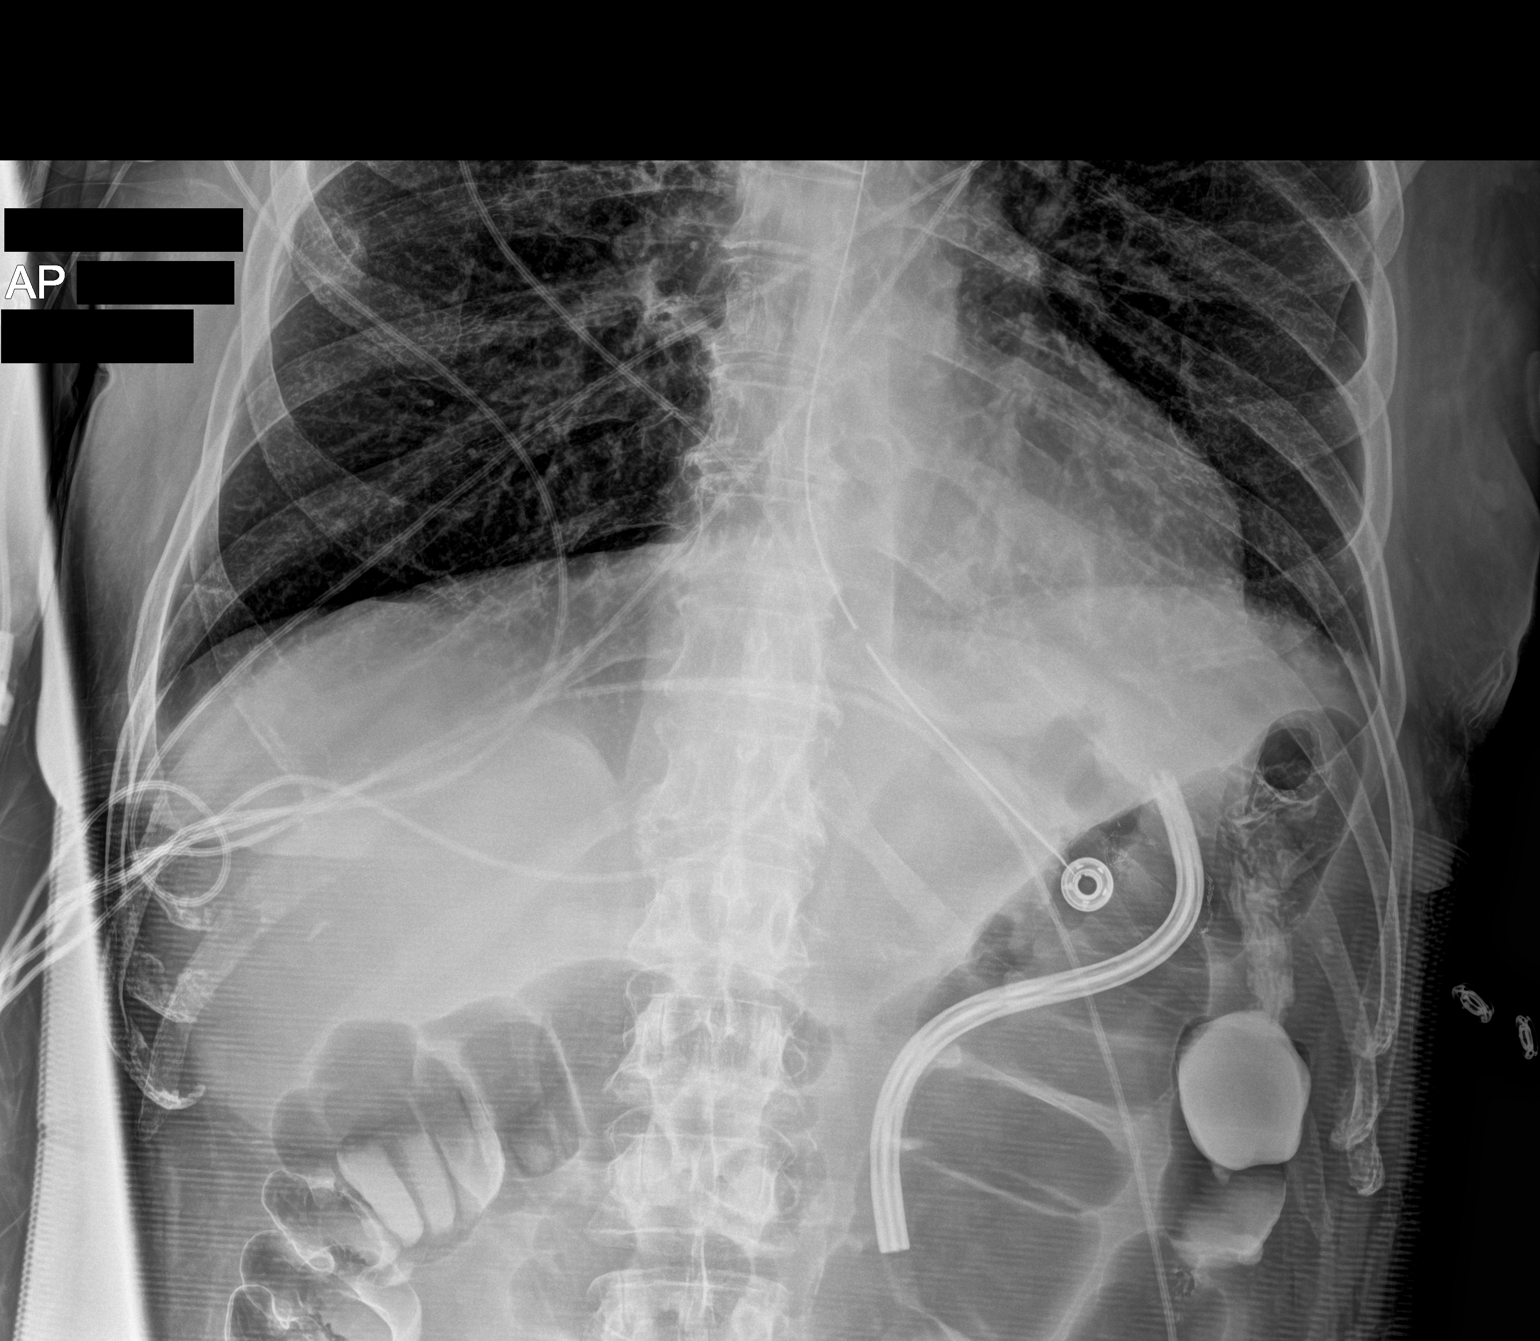

[1 of 1 positions shown; findings below may reference images not displayed]

FINDINGS: NG tube tip is in the stomach.
IMPRESSION: NG tube in the stomach.

## 2020-12-12 MED ORDER — VITAMIN A 3 MG (10000 UNIT) PO CAPS
20000.0000 [IU] | ORAL_CAPSULE | Freq: Every day | ORAL | Status: DC
  Administered 2020-12-12 – 2020-12-14 (×3): 20000 [IU] via NASOGASTRIC
  Filled 2020-12-12 (×4): qty 2

## 2020-12-12 MED ORDER — HYDROMORPHONE HCL 1 MG/ML IJ SOLN
0.5000 mg | INTRAMUSCULAR | Status: DC | PRN
  Administered 2020-12-12 – 2020-12-18 (×35): 0.5 mg via INTRAVENOUS
  Filled 2020-12-12 (×35): qty 0.5

## 2020-12-12 MED ORDER — TRACE MINERALS CU-MN-SE-ZN 300-55-60-3000 MCG/ML IV SOLN
INTRAVENOUS | Status: AC
  Filled 2020-12-12: qty 1020

## 2020-12-12 MED ORDER — SODIUM CHLORIDE 0.9% IV SOLUTION: Freq: Once | INTRAVENOUS | Status: AC

## 2020-12-12 MED ORDER — OCUVITE-LUTEIN PO CAPS
1.0000 | ORAL_CAPSULE | Freq: Every day | ORAL | Status: DC
  Administered 2020-12-12 – 2020-12-14 (×3): 1
  Filled 2020-12-12 (×4): qty 1

## 2020-12-12 NOTE — Progress Notes (Signed)
Patient ID: Marcus [name], male   DOB: 04/19/1960, 60 y.o.   MRN: [id number]  PROGRESS NOTE    Marcus [name]  [id number] DOB: 10/20/1960 DOA: 12/02/2020 PCP: [name], FNP   Brief Narrative:  60 y.o. male with history of COPD, rheumatoid arthritis, Felty syndrome, chronic respiratory failure on 2 L of oxygen, tobacco use, history of MRSA pneumonia presented secondary to shortness of breath and was found to have gastric perforation with concern for intra-abdominal abscess.  Patient was started on empiric antibiotics and general surgery was consulted.  He was treated with conservative management.  TPN was started for nutrition.  During hospitalization, he developed worsening neutropenia in the setting of underlying Felty syndrome requiring Granix by hematology/oncology.  He also developed significant thrombocytopenia without bleeding.  HIT panel was negative.  Repeat CT of abdomen/pelvis with persistent perforation/fluid collection.  He underwent surgical intervention on 12/11/2020.  Assessment & Plan:   Sepsis: Present on admission -Cultures negative so far.  Currently hemodynamically stable.  Perforated gastric ulcer with intra-abdominal abscess -Initially treated with conservatively with IV antibiotics and TPN. -Repeat CT of abdomen and pelvis on 12/10/2020 showed evidence of persistent perforation in addition to fluid collection. -Status post surgical intervention on 12/11/2020.  Currently has NG tube.  Diet advancement as per general surgery.  Wound care as per general surgery. -Continue IV meropenem.  Felty syndrome Neutropenia Rheumatoid arthritis Status post splenectomy -Heme-onc on board.  Patient has received Granix for neutropenia.  Subsequently neutropenia has resolved.  Granix has been stopped.  Acute thrombocytopenia -Questionable cause.  He tested negative so far.  Lovenox discontinued. -Has received platelet transfusion during this hospitalization.  Platelets 23  today.  Elevated LFTs with hyperbilirubinemia -Concerning for biliary pathology.  CT abdomen/pelvis without evidence of biliary disease.  Number slowly trending down except for slightly worsening total bilirubin today.  Monitor  COPD -Stable.  Continue current inhaler regimen.  Abdominal aortic aneurysm 3.2 x 3.0 cm.  Recommendation for follow-up ultrasound every 3 years   DVT prophylaxis: SCDs Code Status: Full Family Communication: None at bedside Disposition Plan: Status is: Inpatient  Remains inpatient appropriate because:Inpatient level of care appropriate due to severity of illness  Dispo: The patient is from: Home              Anticipated d/c is to: Home              Patient currently is not medically stable to d/c.   Difficult to place patient No  Consultants: General surgery; hematology/oncology; ID  Procedures: Robotic assisted partial sleeve gastrectomy/cholecystectomy/incisional hernia repair on 12/11/2020  Antimicrobials:  Anti-infectives (From admission, onward)    Start     Dose/Rate Route Frequency Ordered Stop   12/08/20 1400  meropenem (MERREM) 1 g in sodium chloride 0.9 % 100 mL IVPB        1 g 200 mL/hr over 30 Minutes Intravenous Every 8 hours 12/07/20 1611     12/07/20 1600  ertapenem (INVANZ) 1,000 mg in sodium chloride 0.9 % 100 mL IVPB  Status:  Discontinued       Note to Pharmacy: Thrombocytopenia possibly due to cefepime, hematology suggested to change antibiotics   1 g 200 mL/hr over 30 Minutes Intravenous Every 24 hours 12/07/20 0836 12/07/20 1611   12/03/20 1400  ceFEPIme (MAXIPIME) 2 g in sodium chloride 0.9 % 100 mL IVPB  Status:  Discontinued        2 g 200 mL/hr over 30 Minutes Intravenous  Every 8 hours 12/03/20 0637 12/07/20 0836   12/03/20 0545  ceFEPIme (MAXIPIME) 2 g in sodium chloride 0.9 % 100 mL IVPB        2 g 200 mL/hr over 30 Minutes Intravenous  Once 12/03/20 0532 12/03/20 0638   12/03/20 0545  metroNIDAZOLE (FLAGYL) IVPB 500 mg   Status:  Discontinued        500 mg 100 mL/hr over 60 Minutes Intravenous Every 12 hours 12/03/20 0532 12/07/20 1534   12/03/20 0330  piperacillin-tazobactam (ZOSYN) IVPB 3.375 g        3.375 g 100 mL/hr over 30 Minutes Intravenous  Once 12/03/20 0324 12/03/20 0401        Subjective: Patient seen and examined at bedside.  Complains of some abdominal pain.  No overnight fever or vomiting reported.  Poor historian.  Objective: Vitals:   12/12/20 0808 12/12/20 0929 12/12/20 0953 12/12/20 0954  BP: 112/69 109/67 110/69 110/69  Pulse: 65 66 65 65  Resp: '15 17 18 18  '$ Temp: (!) 97.5 F (36.4 C) 97.6 F (36.4 C) 98.4 F (36.9 C) 98.4 F (36.9 C)  TempSrc: Oral Tympanic Oral Oral  SpO2: 100% 100% 100% 100%  Weight:      Height:        Intake/Output Summary (Last 24 hours) at 12/12/2020 1139 Last data filed at 12/12/2020 0939 Gross per 24 hour  Intake 3393.88 ml  Output 1960 ml  Net 1433.88 ml   Filed Weights   12/10/20 0500 12/11/20 0502 12/12/20 0442  Weight: 57.2 kg 59.6 kg 62.6 kg    Examination:  General exam: Appears calm and comfortable.  Looks chronically ill and deconditioned. ENT: NG tube present Respiratory system: Bilateral decreased breath sounds at bases Cardiovascular system: S1 & S2 heard, Rate controlled Gastrointestinal system: Abdomen is distended, dressing present with surrounding tenderness.  Bowel sounds sluggish  extremities: No cyanosis, clubbing, edema  Central nervous system: Alert and oriented.  Slow to respond.  Poor historian.  No focal neurological deficits. Moving extremities Skin: No obvious ecchymosis/other lesions Psychiatry: Mostly flat affect.  Does not participate in conversation much.   Data Reviewed: I have personally reviewed following labs and imaging studies  CBC: Recent Labs  Lab 12/08/20 0530 12/09/20 0435 12/10/20 0443 12/11/20 0644 12/11/20 1304 12/11/20 1840 12/12/20 0328  WBC 2.2* 1.3* 4.1 11.9*  --  12.2* 9.9   NEUTROABS 0.8* 0.5* 2.3 10.8*  --   --  8.9*  HGB 13.8 13.9 15.2 12.7*  --  11.2* 9.9*  HCT 41.6 44.0 44.9 37.7*  --  33.4* 29.0*  MCV 82.5 95.2 81.2 82.0  --  82.9 84.3  PLT 44* 56* 20* 21* 39* 27* 23*   Basic Metabolic Panel: Recent Labs  Lab 12/06/20 0450 12/07/20 0440 12/08/20 0530 12/09/20 0700 12/10/20 0443 12/10/20 0813 12/11/20 0644 12/12/20 0328  NA 137 138 142 137 132*  --  131* 132*  K 4.1 4.5 4.7 4.8 6.1* 5.2* 4.5 4.6  CL 110 110 111 103 99  --  100 100  CO2 21* '23 27 28 28  '$ --  27 27  GLUCOSE 96 120* 130* 139* 86  --  147* 274*  BUN '12 12 17 18 '$ 24*  --  24* 35*  CREATININE 0.64 0.51* 0.54* 0.41* <0.30*  --  <0.30* 0.43*  CALCIUM 7.8* 8.1* 8.1* 8.2* 8.2*  --  7.9* 7.7*  MG 2.0 2.1 2.3 2.4 2.3  --   --   --  PHOS 3.3 3.7 4.5 3.8 2.3*  --  3.8  --    GFR: Estimated Creatinine Clearance: 88 mL/min (A) (by C-G formula based on SCr of 0.43 mg/dL (L)). Liver Function Tests: Recent Labs  Lab 12/10/20 0443 12/11/20 0644 12/12/20 0328  AST 55* 35 76*  ALT '18 12 23  '$ ALKPHOS 311* 214* 177*  BILITOT 4.2* 2.7* 3.2*  PROT 5.6* 5.1* 4.9*  ALBUMIN 2.3* 2.0* 2.2*   No results for input(s): LIPASE, AMYLASE in the last 168 hours. No results for input(s): AMMONIA in the last 168 hours. Coagulation Profile: Recent Labs  Lab 12/07/20 1020 12/11/20 1304 12/11/20 1840 12/12/20 0328  INR 1.7* 1.0 1.1 1.1   Cardiac Enzymes: No results for input(s): CKTOTAL, CKMB, CKMBINDEX, TROPONINI in the last 168 hours. BNP (last 3 results) No results for input(s): PROBNP in the last 8760 hours. HbA1C: No results for input(s): HGBA1C in the last 72 hours. CBG: Recent Labs  Lab 12/11/20 1752 12/12/20 0005 12/12/20 0530 12/12/20 0810 12/12/20 1126  GLUCAP 91 280* 249* 200* 174*   Lipid Profile: Recent Labs    12/10/20 0443  TRIG 418*   Thyroid Function Tests: No results for input(s): TSH, T4TOTAL, FREET4, T3FREE, THYROIDAB in the last 72 hours. Anemia Panel: No  results for input(s): VITAMINB12, FOLATE, FERRITIN, TIBC, IRON, RETICCTPCT in the last 72 hours. Sepsis Labs: No results for input(s): PROCALCITON, LATICACIDVEN in the last 168 hours.  Recent Results (from the past 240 hour(s))  Blood culture (routine x 2)     Status: None   Collection Time: 12/02/20  5:32 PM   Specimen: BLOOD  Result Value Ref Range Status   Specimen Description BLOOD LAC  Final   Special Requests   Final    BOTTLES DRAWN AEROBIC AND ANAEROBIC Blood Culture adequate volume   Culture   Final    NO GROWTH 5 DAYS Performed at [facility], [address] [place], [place] [address]    Report Status 12/07/2020 FINAL  Final  Resp Panel by RT-PCR (Flu A&B, Covid) Nasopharyngeal Swab     Status: None   Collection Time: 12/02/20 11:35 PM   Specimen: Nasopharyngeal Swab; Nasopharyngeal(NP) swabs in vial transport medium  Result Value Ref Range Status   SARS Coronavirus 2 by RT PCR NEGATIVE NEGATIVE Final    Comment: (NOTE) SARS-CoV-2 target nucleic acids are NOT DETECTED.  The SARS-CoV-2 RNA is generally detectable in upper respiratory specimens during the acute phase of infection. The lowest concentration of SARS-CoV-2 viral copies this assay can detect is 138 copies/mL. A negative result does not preclude SARS-Cov-2 infection and should not be used as the sole basis for treatment or other patient management decisions. A negative result may occur with  improper specimen collection/handling, submission of specimen other than nasopharyngeal swab, presence of viral mutation(s) within the areas targeted by this assay, and inadequate number of viral copies(<138 copies/mL). A negative result must be combined with clinical observations, patient history, and epidemiological information. The expected result is Negative.  Fact Sheet for Patients:  EntrepreneurPulse.com.au  Fact Sheet for Healthcare Providers:   IncredibleEmployment.be  This test is no t yet approved or cleared by the [place] FDA and  has been authorized for detection and/or diagnosis of SARS-CoV-2 by FDA under an Emergency Use Authorization (EUA). This EUA will remain  in effect (meaning this test can be used) for the duration of the COVID-19 declaration under Section 564(b)(1) of the Act, 21 U.S.C.section 360bbb-3(b)(1), unless the authorization is  terminated  or revoked sooner.       Influenza A by PCR NEGATIVE NEGATIVE Final   Influenza B by PCR NEGATIVE NEGATIVE Final    Comment: (NOTE) The Xpert Xpress SARS-CoV-2/FLU/RSV plus assay is intended as an aid in the diagnosis of influenza from Nasopharyngeal swab specimens and should not be used as a sole basis for treatment. Nasal washings and aspirates are unacceptable for Xpert Xpress SARS-CoV-2/FLU/RSV testing.  Fact Sheet for Patients: EntrepreneurPulse.com.au  Fact Sheet for Healthcare Providers: IncredibleEmployment.be  This test is not yet approved or cleared by the [place] FDA and has been authorized for detection and/or diagnosis of SARS-CoV-2 by FDA under an Emergency Use Authorization (EUA). This EUA will remain in effect (meaning this test can be used) for the duration of the COVID-19 declaration under Section 564(b)(1) of the Act, 21 U.S.C. section 360bbb-3(b)(1), unless the authorization is terminated or revoked.  Performed at [facility], [address] [place], [place] [address]   Blood culture (routine x 2)     Status: None   Collection Time: 12/02/20 11:43 PM   Specimen: BLOOD  Result Value Ref Range Status   Specimen Description BLOOD BLOOD LEFT HAND  Final   Special Requests   Final    BOTTLES DRAWN AEROBIC AND ANAEROBIC Blood Culture adequate volume   Culture   Final    NO GROWTH 5 DAYS Performed at [facility], [address], [place], [place]  [address]    Report Status 12/07/2020 FINAL  Final  MRSA Next Gen by PCR, Nasal     Status: None   Collection Time: 12/10/20  9:20 PM   Specimen: Nasal Mucosa; Nasal Swab  Result Value Ref Range Status   MRSA by PCR Next Gen NOT DETECTED NOT DETECTED Final    Comment: (NOTE) The GeneXpert MRSA Assay (FDA approved for NASAL specimens only), is one component of a comprehensive MRSA colonization surveillance program. It is not intended to diagnose MRSA infection nor to guide or monitor treatment for MRSA infections. Test performance is not FDA approved in patients less than 2 years old. Performed at [facility], [address], [place], [place] [address]          Radiology Studies: DG Abd 1 View  Result Date: 12/12/2020 CLINICAL DATA:  NG tube placement EXAM: ABDOMEN - 1 VIEW COMPARISON:  06/10/2020 FINDINGS: NG tube tip is in the stomach. IMPRESSION: NG tube in the stomach. Electronically Signed   By: Rolm Baptise M.D.   On: 12/12/2020 00:51   DG Abd Portable 1V  Result Date: 12/12/2020 CLINICAL DATA:  NG tube advancement EXAM: PORTABLE ABDOMEN - 1 VIEW COMPARISON:  12/12/2020 at 0132 hours FINDINGS: Limited radiograph of the lower chest and upper abdomen was obtained for the purposes of enteric tube localization. Enteric tube is seen coursing below the diaphragm with distal tip and side port terminating within the expected location of the gastric body. Surgical drain in the left upper quadrant. IMPRESSION: Enteric tube terminates within the gastric body. Electronically Signed   By: Davina Poke D.O.   On: 12/12/2020 11:27   DG Abd Portable 2V  Result Date: 12/12/2020 CLINICAL DATA:  Perforated bowel.  NGT adjustment. EXAM: PORTABLE ABDOMEN - 2 VIEW COMPARISON:  Radiograph earlier today.  CT 12/10/2020 FINDINGS: The endotracheal tube has been retracted, tip is in the region of the gastroesophageal junction, side-port in the stomach. Again seen contrast within the colon.  Surgical drain in the left upper  quadrant. IMPRESSION: Enteric tube tip in the region of the gastroesophageal junction, side-port in the stomach. Recommend advancement of at least 7-1/2 cm to place the side-port below the diaphragm. Electronically Signed   By: Keith Rake M.D.   On: 12/12/2020 01:53        Scheduled Meds:  Chlorhexidine Gluconate Cloth  6 each Topical Daily   insulin aspart  0-9 Units Subcutaneous Q6H   mometasone-formoterol  2 puff Inhalation BID   multivitamin with minerals  1 tablet Oral Daily   multivitamin-lutein  1 capsule Per Tube Daily   nicotine  21 mg Transdermal Daily   pantoprazole (PROTONIX) IV  40 mg Intravenous BID   predniSONE  10 mg Oral BID   sodium chloride flush  10-40 mL Intracatheter Q12H   vitamin A  20,000 Units Per NG tube Daily   Continuous Infusions:  meropenem (MERREM) IV 1 g (12/12/20 0535)   TPN ADULT (ION) 85 mL/hr at 12/12/20 0429   TPN ADULT (ION)            Aline August, MD [facility] 12/12/2020, 11:39 AM

## 2020-12-12 NOTE — Progress Notes (Signed)
Nutrition Follow Up Note   DOCUMENTATION CODES:   Severe malnutrition in context of chronic illness  INTERVENTION:   TPN per pharmacy- currently at goal rate   Ocuvite daily for wound healing (provides zinc, vitamin A, vitamin C, Vitamin E, copper, and selenium)  NUTRITION DIAGNOSIS:   Severe Malnutrition related to chronic illness (COPD) as evidenced by severe muscle depletion, severe fat depletion.  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 Pt with hyperglycemia today. Triglycerides elevated on 9/5. Triglycerides also noted to be elevated in June. Plan for now is to get blood sugars under control and provide lipids every other day. Monitor triglycerides. NGT in place with 121ml output. Drains in place with 228ml output. Per chart, pt has remained fairly weight stable since admission.   Medications reviewed and include: insulin, MVI, ocuvite, nicotine, protonix, prednisone, vitamin A, meropenem  Labs reviewed: Na 132(L), K 4.6 wnl, BUN 35(H), creat 0.43(L), P 3.8 wnl, tbili 3.2(H), alk phos 177(H), AST 76(H) Mg 2.3 wnl- 9/5 Triglycerides 418(H)- 9/5 Hgb 9.9(L), Hct 29.0(L) cbgs- 280, 249, 200, 174 x 24 hrs  Diet Order:   Diet Order             Diet NPO time specified Except for: Sips with Meds  Diet effective now                  EDUCATION NEEDS:   Education needs have been addressed  Skin:  Skin Assessment: Reviewed RN Assessment (ecchymosis)  Last  BM:  9/5  Height:   Ht Readings from Last 1 Encounters:  12/03/20 $RemoveB'5\' 10"'SuohCihL$  (1.778 m)    Weight:   Wt Readings from Last 1 Encounters:  12/12/20 62.6 kg    Ideal Body Weight:  75.45 kg  BMI:  Body mass index is 19.8 kg/m.  Estimated Nutritional Needs:   Kcal:  1900-2200kcal/day  Protein:  95-110g/day  Fluid:  1.8-2.1L/day  Koleen Distance MS, RD, LDN Please refer to [facility] for RD and/or RD on-call/weekend/after hours pager

## 2020-12-12 NOTE — Evaluation (Signed)
Physical Therapy Evaluation Patient Details Name: Marcus [name] MRN: [id number] DOB: 1961-02-25 Today's Date: 12/12/2020   History of Present Illness  60 y.o. male with multiple recent hospital admssions 5/18-5/26, 6/13-6/24, 6/26-7/3 intubated in July for acute on chronic hypoxic/hypercapnic respiratory failure and acute metabolic encephalopathy.  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  Now  s/p robotic assisted laparoscopic partial sleeve gastrectomy, cholecystectomy, and incisional hernia repair for proximal posterior gastric perforation with abscess and cholecystitis on 9/6  Clinical Impression  Pt with considerable abdominal pain after extensive laproscopic surgery yesterday.  Seen today after platelet transfusion, pt with may lines/tubes/drains making a lot of mobility difficult, though he was willing to try and see what he could do.  Pt with general weakness but grossly functional t/o except for b/l ankle ROM limited (R<L) with lacking 5-10deg from neutral in DF posture.  This did not allow pt to even attain standing at EOB, heavy posterior LE lean on bed and b/l UEs did allow temporary ability to maintain hips off bed but no chance at weight shifts etc 2/2 ankle ROM limitations.     Follow Up Recommendations SNF    Equipment Recommendations  None recommended by PT    Recommendations for Other Services       Precautions / Restrictions Precautions Precautions: Fall Restrictions Weight Bearing Restrictions: No      Mobility  Bed Mobility Overal bed mobility: Modified Independent             General bed mobility comments: assist keeping tubes/lines/leads clear but pt able to transition to sitting w/o assist    Transfers Overall transfer level: Needs assistance Equipment used: Rolling walker (2 wheeled) Transfers: Sit  to/from Stand Sit to Stand: Mod assist;Max assist         General transfer comment: Pt struggled with gettijng to standing.  He was able to initiate upward movement but could not get weight forward/over walker.  Ankles lack ROM to neutral and he leaned back of legs heavily on the bed.  Pt unable to shift weight onto RW.  Ambulation/Gait             General Gait Details: ankle ROM and retro lean too much to maintain upright enough to attempt side stepping much less attempt forward ambulation.  Stairs            Wheelchair Mobility    Modified Rankin (Stroke Patients Only)       Balance Overall balance assessment: Needs assistance Sitting-balance support: No upper extremity supported Sitting balance-Leahy Scale: Good     Standing balance support: Bilateral upper extremity supported Standing balance-Leahy Scale: Zero Standing balance comment: leaing heavily with back of legs on bed, unable to attain upright                             Pertinent Vitals/Pain Pain Assessment: 0-10 Pain Score: 6  Pain Location: abdomen, NGtube    Home Living Family/patient expects to be discharged to:: Unsure (very much wants to go home but realizes how functionally limited he is) Living Arrangements: Parent Available Help at Discharge: Family;Available PRN/intermittently (brother lives next door, pt is caregiver for his mother) Type of Home: House Home Access: Stairs to enter Entrance Stairs-Rails: Can reach both Entrance Stairs-Number of Steps: 4 Home Layout: One level Home Equipment: Gilford Rile -  2 wheels;Cane - single point Additional Comments: 3LO2/min at home    Prior Function Level of Independence: Independent with assistive device(s)         Comments: Pt cares for his mostly independent mother, regularly out of the house, runs errands, drives, etc     Hand Dominance        Extremity/Trunk Assessment   Upper Extremity Assessment Upper Extremity  Assessment: Generalized weakness;Overall [facility] for tasks assessed    Lower Extremity Assessment Lower Extremity Assessment: Generalized weakness;Overall WFL for tasks assessed       Communication   Communication: No difficulties  Cognition Arousal/Alertness: Awake/alert Behavior During Therapy: WFL for tasks assessed/performed Overall Cognitive Status: Within Functional Limits for tasks assessed                                        General Comments      Exercises     Assessment/Plan    PT Assessment Patient needs continued PT services  PT Problem List Decreased strength;Decreased range of motion;Decreased activity tolerance;Decreased balance;Decreased safety awareness;Decreased knowledge of use of DME;Decreased mobility;Pain       PT Treatment Interventions DME instruction;Gait training;Functional mobility training;Therapeutic activities;Therapeutic exercise;Balance training;Neuromuscular re-education;Patient/family education    PT Goals (Current goals can be found in the Care Plan section)  Acute Rehab PT Goals Patient Stated Goal: go home PT Goal Formulation: With patient Time For Goal Achievement: 12/26/20 Potential to Achieve Goals: Fair    Frequency Min 2X/week   Barriers to discharge        Co-evaluation               AM-PAC PT "6 Clicks" Mobility  Outcome Measure Help needed turning from your back to your side while in a flat bed without using bedrails?: A Little Help needed moving from lying on your back to sitting on the side of a flat bed without using bedrails?: A Little Help needed moving to and from a bed to a chair (including a wheelchair)?: A Lot Help needed standing up from a chair using your arms (e.g., wheelchair or bedside chair)?: A Lot Help needed to walk in hospital room?: Total Help needed climbing 3-5 steps with a railing? : Total 6 Click Score: 12    End of Session Equipment Utilized During Treatment: Gait  belt;Oxygen Activity Tolerance: Patient tolerated treatment well;Patient limited by pain Patient left: with bed alarm set;with call bell/phone within reach   PT Visit Diagnosis: Muscle weakness (generalized) (M62.81);Difficulty in walking, not elsewhere classified (R26.2)    Time: [id number] PT Time Calculation (min) (ACUTE ONLY): 25 min   Charges:   PT Evaluation $PT Eval Low Complexity: 1 Low PT Treatments $Therapeutic Activity: 8-22 mins        Kreg Shropshire, DPT 12/12/2020, 1:36 PM

## 2020-12-12 NOTE — Progress Notes (Signed)
Part of patient's NG tube was curled in his mouth so Dr. Dahlia Byes was notified.  I was instructed to pull tube back to 50 cm from previous measurement of 67cm placed while in surgery.  Xray taken before and after pulling of NG tube.  Patient tolerated procedure well.  Will continue to monitor.  Christene Slates  12/12/2020  2:13 AM

## 2020-12-12 NOTE — Progress Notes (Signed)
Date of Admission:  12/02/2020     ID: Marcus [name] is a 60 y.o. 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[place])   Intra-abdominal abscess ([facility])   Thrombocytopenia ([facility])   Neutropenia ([place])   Abdominal aortic aneurysm (AAA) ([place])   Sepsis ([place])   Calculus of gallbladder without cholecystitis without obstruction   Incisional hernia, without obstruction or gangrene    Subjective: Pt is bright and with no distress Says he is feeling better Minimal pain  Medications:   Chlorhexidine Gluconate Cloth  6 each Topical Daily   insulin aspart  0-9 Units Subcutaneous Q6H   mometasone-formoterol  2 puff Inhalation BID   multivitamin with minerals  1 tablet Oral Daily   multivitamin-lutein  1 capsule Per Tube Daily   nicotine  21 mg Transdermal Daily   pantoprazole (PROTONIX) IV  40 mg Intravenous BID   predniSONE  10 mg Oral BID   sodium chloride flush  10-40 mL Intracatheter Q12H   vitamin A  20,000 Units Per NG tube Daily    Objective: Vital signs in last 24 hours: Temp:  [97.4 F (36.3 C)-98.4 F (36.9 C)] 98.2 F (36.8 C) (09/07 1215) Pulse Rate:  [65-95] 66 (09/07 1215) Resp:  [15-22] 20 (09/07 1215) BP: (89-116)/(60-72) 112/72 (09/07 1215) SpO2:  [88 %-100 %] 95 % (09/07 1215) Weight:  [62.6 kg] 62.6 kg (09/07 0442)  PHYSICAL EXAM:  General: Alert, cooperative, no distress, appears stated age.  Head: Normocephalic, without obvious abnormality, atraumatic. Eyes: Conjunctivae clear, anicteric sclerae. Pupils are equal ENT NG tube- bilious drain  Neck: Supple, symmetrical, no adenopathy, thyroid: non tender no carotid bruit and no JVD. Back: No CVA tenderness. Lungs: Clear to auscultation bilaterally. No Wheezing or Rhonchi. No rales. Heart: Regular rate and rhythm, no murmur, rub or gallop. Abdomen:binder in place Extremities: atraumatic, no cyanosis. No edema. No clubbing Skin: No rashes or lesions. Or  bruising Lymph: Cervical, supraclavicular normal. Neurologic: Grossly non-focal  Lab Results Recent Labs    12/11/20 0644 12/11/20 1840 12/12/20 0328  WBC 11.9* 12.2* 9.9  HGB 12.7* 11.2* 9.9*  HCT 37.7* 33.4* 29.0*  NA 131*  --  132*  K 4.5  --  4.6  CL 100  --  100  CO2 27  --  27  BUN 24*  --  35*  CREATININE <0.30*  --  0.43*   Liver Panel Recent Labs    12/10/20 0813 12/11/20 0644 12/12/20 0328  PROT  --  5.1* 4.9*  ALBUMIN  --  2.0* 2.2*  AST  --  35 76*  ALT  --  12 23  ALKPHOS  --  214* 177*  BILITOT  --  2.7* 3.2*  BILIDIR 2.2* 1.5*  --    Sedimentation Rate No results for input(s): ESRSEDRATE in the last 72 hours. C-Reactive Protein No results for input(s): CRP in the last 72 hours.  Microbiology:  Studies/Results: DG Abd 1 View  Result Date: 12/12/2020 CLINICAL DATA:  NG tube placement EXAM: ABDOMEN - 1 VIEW COMPARISON:  06/10/2020 FINDINGS: NG tube tip is in the stomach. IMPRESSION: NG tube in the stomach. Electronically Signed   By: Rolm Baptise M.D.   On: 12/12/2020 00:51   DG Abd Portable 1V  Result Date: 12/12/2020 CLINICAL DATA:  NG tube advancement EXAM: PORTABLE ABDOMEN - 1 VIEW COMPARISON:  12/12/2020 at 0132 hours FINDINGS: Limited radiograph of the lower chest and upper abdomen was  obtained for the purposes of enteric tube localization. Enteric tube is seen coursing below the diaphragm with distal tip and side port terminating within the expected location of the gastric body. Surgical drain in the left upper quadrant. IMPRESSION: Enteric tube terminates within the gastric body. Electronically Signed   By: Davina Poke D.O.   On: 12/12/2020 11:27   DG Abd Portable 2V  Result Date: 12/12/2020 CLINICAL DATA:  Perforated bowel.  NGT adjustment. EXAM: PORTABLE ABDOMEN - 2 VIEW COMPARISON:  Radiograph earlier today.  CT 12/10/2020 FINDINGS: The endotracheal tube has been retracted, tip is in the region of the gastroesophageal junction, side-port in  the stomach. Again seen contrast within the colon. Surgical drain in the left upper quadrant. IMPRESSION: Enteric tube tip in the region of the gastroesophageal junction, side-port in the stomach. Recommend advancement of at least 7-1/2 cm to place the side-port below the diaphragm. Electronically Signed   By: Keith Rake M.D.   On: 12/12/2020 01:53     Assessment/Plan: Gastric  perforation posteriorly with abscess on meropenem S/p robotic partial  sleeve gastrectomy on 12/11/20 Cholecystitis s/p cholecystectomy Incisional hernia repair  needed multiple platelet transfusion  Felty syndrome with bicytopenia Significant thrombocytopenia  Rheumatoid arthritis  H/o GI bleed- stapled last admission and perforation is at that site now  H/o treated MRSA rt lung abscess/pneumonia  Discussed the management with the patient

## 2020-12-12 NOTE — Progress Notes (Signed)
[facility] Day(s): 9.   Post op day(s): 1 Day Post-Op.   Interval History:  Patient seen and examined Overnight had issues with NGT and attempted repositioning; KUB post repositioning showed this was still at [place] junction Patient reports he is having incisional abdominal pain No fever, chills, nausea, emesis His leukocytosis is improved, down to 9.9K Hgb 9.9 (from 11.2) PLT still low at 23K Renal function remains normal; sCr - 0.46; UO - 1.5 NGT with 150 ccs out recorded Surgical drain with 210 ccs; serosanguinous with blood He remains NPO on TPN  Vital signs in last 24 hours: [min-max] current  Temp:  [97.4 F (36.3 C)-98.3 F (36.8 C)] 97.5 F (36.4 C) (09/07 0528) Pulse Rate:  [67-95] 67 (09/07 0341) Resp:  [15-22] 16 (09/07 0341) BP: (89-125)/(60-78) 98/60 (09/07 0341) SpO2:  [88 %-100 %] 96 % (09/07 0341) Weight:  [62.6 kg] 62.6 kg (09/07 0442)     Height: '5\' 10"'$  (177.8 cm) Weight: 62.6 kg BMI (Calculated): 19.8   Intake/Output last 2 shifts:  09/06 0701 - 09/07 0700 In: 3035.4 [I.V.:2072.9; Blood:662.5; IV Piggyback:300] Out: 2110 [Urine:1550; Emesis/NG output:150; Drains:210; Blood:200]   Physical Exam:  Constitutional: alert, cooperative and no distress  HEENT: NGT in place; attempted repositioning Respiratory: breathing non-labored at rest  Cardiovascular: regular rate and sinus rhythm  Gastrointestinal: Soft, incisional soreness, non-distended, no rebound/guarding. Surgical drain in place, serosanguinous and bloody output Integumentary: Laparoscopic incisions are CDI with dermabond, no erythema or drainage   Labs:  CBC Latest Ref Rng & Units 12/11/2020 12/11/2020 12/11/2020  WBC 4.0 - 10.5 K/uL 12.2(H) - 11.9(H)  Hemoglobin 13.0 - 17.0 g/dL 11.2(L) - 12.7(L)  Hematocrit 39.0 - 52.0 % 33.4(L) - 37.7(L)  Platelets 150 - 400 K/uL 27(LL) 39(L) 21(LL)   CMP Latest Ref Rng & Units 12/12/2020 12/11/2020 12/10/2020  Glucose  70 - 99 mg/dL 274(H) 147(H) -  BUN 6 - 20 mg/dL 35(H) 24(H) -  Creatinine 0.61 - 1.24 mg/dL 0.43(L) <0.30(L) -  Sodium 135 - 145 mmol/L 132(L) 131(L) -  Potassium 3.5 - 5.1 mmol/L 4.6 4.5 5.2(H)  Chloride 98 - 111 mmol/L 100 100 -  CO2 22 - 32 mmol/L 27 27 -  Calcium 8.9 - 10.3 mg/dL 7.7(L) 7.9(L) -  Total Protein 6.5 - 8.1 g/dL 4.9(L) 5.1(L) -  Total Bilirubin 0.3 - 1.2 mg/dL 3.2(H) 2.7(H) -  Alkaline Phos 38 - 126 U/L 177(H) 214(H) -  AST 15 - 41 U/L 76(H) 35 -  ALT 0 - 44 U/L 23 12 -     Imaging studies:   KUB (12/12/2020) personally reviewed with NGT at GE junction, and radiologist report reviewed below:  IMPRESSION: Enteric tube tip in the region of the gastroesophageal junction, side-port in the stomach. Recommend advancement of at least 7-1/2 cm to place the side-port below the diaphragm.   Assessment/Plan:  60 y.o. male with 1 Day Post-Op s/p robotic assisted laparoscopic partial sleeve gastrectomy, cholecystectomy, and incisional hernia repair for proximal posterior gastric perforation with abscess and cholecystitis   - Continue strict NPO until Friday; we will plan on reassessing repair at that time  - Continue TPN at goal rate  - Continue NGT decompression; monitor and record output  --> This was readjusting and will repeat KUB  - Continue IV Abx (meropenem) - Monitor abdominal examination; on-going bowel function - Pain control prn; antiemetics prn - Okay to ambulate with PT  - Appreciate hematology/oncology; will transfuse PLT again  today    All of the above findings and recommendations were discussed with the patient, and the medical team, and all of patient's questions were answered to his expressed satisfaction.  -- Edison Simon, PA-C [facility] 12/12/2020, 7:09 AM [phone] M-F: 7am - 4pm

## 2020-12-12 NOTE — Progress Notes (Signed)
Marcus [name]   DOB:1960-06-19   [id number]    Subjective: Patient underwent surgery for gastric perforation on 09/06.  Patient needed cryoprecipitate/platelets prior to surgery.  Objective:  Vitals:   12/12/20 1215 12/12/20 1951  BP: 112/72 114/75  Pulse: 66 73  Resp: 20 20  Temp: 98.2 F (36.8 C) (!) 97.3 F (36.3 C)  SpO2: 95% 92%     Intake/Output Summary (Last 24 hours) at 12/12/2020 2237 Last data filed at 12/12/2020 1904 Gross per 24 hour  Intake 3444.54 ml  Output 2560 ml  Net 884.54 ml    Physical Exam Vitals and nursing note reviewed.  Constitutional:      Comments: NG tube in place.   HENT:     Head: Normocephalic and atraumatic.     Mouth/Throat:     Pharynx: Oropharynx is clear.  Eyes:     Extraocular Movements: Extraocular movements intact.     Pupils: Pupils are equal, round, and reactive to light.  Cardiovascular:     Rate and Rhythm: Normal rate and regular rhythm.  Pulmonary:     Comments: Decreased breath sounds bilaterally.  Abdominal:     Palpations: Abdomen is soft.     Comments: Abdominal incision/bandage noted.  Musculoskeletal:        General: Normal range of motion.     Cervical back: Normal range of motion.  Skin:    General: Skin is warm.  Neurological:     General: No focal deficit present.     Mental Status: He is alert and oriented to person, place, and time.  Psychiatric:        Behavior: Behavior normal.        Judgment: Judgment normal.     Labs:  Lab Results  Component Value Date   WBC 9.9 12/12/2020   HGB 9.9 (L) 12/12/2020   HCT 29.0 (L) 12/12/2020   MCV 84.3 12/12/2020   PLT 23 (LL) 12/12/2020   NEUTROABS 8.9 (H) 12/12/2020    Lab Results  Component Value Date   NA 132 (L) 12/12/2020   K 4.6 12/12/2020   CL 100 12/12/2020   CO2 27 12/12/2020    Studies:  DG Abd 1 View  Result Date: 12/12/2020 CLINICAL DATA:  NG tube placement EXAM: ABDOMEN - 1 VIEW COMPARISON:  06/10/2020 FINDINGS: NG tube tip is in the  stomach. IMPRESSION: NG tube in the stomach. Electronically Signed   By: Rolm Baptise M.D.   On: 12/12/2020 00:51   DG Abd Portable 1V  Result Date: 12/12/2020 CLINICAL DATA:  NG tube advancement EXAM: PORTABLE ABDOMEN - 1 VIEW COMPARISON:  12/12/2020 at 0132 hours FINDINGS: Limited radiograph of the lower chest and upper abdomen was obtained for the purposes of enteric tube localization. Enteric tube is seen coursing below the diaphragm with distal tip and side port terminating within the expected location of the gastric body. Surgical drain in the left upper quadrant. IMPRESSION: Enteric tube terminates within the gastric body. Electronically Signed   By: Davina Poke D.O.   On: 12/12/2020 11:27   DG Abd Portable 2V  Result Date: 12/12/2020 CLINICAL DATA:  Perforated bowel.  NGT adjustment. EXAM: PORTABLE ABDOMEN - 2 VIEW COMPARISON:  Radiograph earlier today.  CT 12/10/2020 FINDINGS: The endotracheal tube has been retracted, tip is in the region of the gastroesophageal junction, side-port in the stomach. Again seen contrast within the colon. Surgical drain in the left upper quadrant. IMPRESSION: Enteric tube tip in the region of the gastroesophageal  junction, side-port in the stomach. Recommend advancement of at least 7-1/2 cm to place the side-port below the diaphragm. Electronically Signed   By: Keith Rake M.D.   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  Currently monitor off Granix.  Reinstitute Granix if ANC less than 1500.  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  Platelets in the 20s-agree with platelet transfusion.  S/p surgery I  think patient's coagulopathy should improve/DIC should improve.  #Gastric perforation s/p surgery clinically stable.    Cammie Sickle, MD 12/12/2020  10:37 PM

## 2020-12-12 NOTE — Progress Notes (Signed)
PHARMACY - TOTAL PARENTERAL NUTRITION CONSULT NOTE   Indication: perforated gastric ulcer  Patient Measurements: Height: '5\' 10"'$  (177.8 cm) Weight: 62.6 kg (138 lb 0.1 oz) IBW/kg (Calculated) : 73 TPN AdjBW (KG): 59.3 Body mass index is 19.8 kg/m.  Assessment:  admitted multiple times recently with hemoptysis and gastric bleeding s/p EGD and clipping in fundus, with cavitary pneumonia, with seizures.  Has history of drug use and was cocaine positive on one of his admissions.  Has also hx of Felty syndrome with neutropenia.  He has surgical history of splenectomy in 2017 and exploratory laparotomy with Phillip Heal patch repair of a pyloric channel perforated ulcer in 2021  Glucose / Insulin: on dexamethasone BG 91 - 274, no hx DM Electrolytes: WNL Renal: Scr < 1, stable Hepatic: ASt/ALT 76/23  09/07; TG 191-->418 Intake / Output; MIVF: no MIVF GI Imaging:perforated gastric ulcer GI Surgeries / Procedures: robotic assisted repair of gastric perforation planned for today ABX: meropenem  Central access: 12/07/20 TPN start date: 12/07/20   Nutritional Goals: Goal TPN rate is 85 mL/hr (provides 102 g of protein and 2060 kcals per day)  RD Assessment: Estimated Needs Total Energy Estimated Needs: 1900-2200kcal/day Total Protein Estimated Needs: 95-110g/day Total Fluid Estimated Needs: 1.8-2.1L/day  Current Nutrition:  NPO  Plan:  Continue TPN at goal rate: 85 mL/hr at 1800 (total volume including overfill: 2140 mL)  Nutritional components: Amino acids (Travasol 10%): 102 grams Lipids (20% SMOF): hold today d/t elevated triglycerides Dextrose: 306 grams kCal: 1448 / 24h Electrolytes in TPN: Na 75 mEq/L, K 40mq/L, Ca 538m/L, Mg 9m29mL, and Phos 19m59mL. Cl:Ac 1:1.   Continue Sensitive q6h SSI and adjust as needed. On oral prednisone, no hx DM   Monitor TPN labs every 3 days at start then on Mon/Thurs  RodnDallie PilesarmD, BCPS 12/12/2020,7:11 AM

## 2020-12-12 NOTE — Progress Notes (Signed)
PT Cancellation Note  Patient Details Name: Marcus [name] MRN: [id number] DOB: [birth year]   Cancelled Treatment:    Reason Eval/Treat Not Completed: Medical issues which prohibited therapy Pt with critically low platelets.  Spoke with nursing who states he will be getting transfusion this AM.  Hope to be able to see pt later this date when this is completed.  Kreg Shropshire, DPT 12/12/2020, 9:35 AM

## 2020-12-13 ENCOUNTER — Encounter: Payer: Self-pay | Admitting: Surgery

## 2020-12-13 DIAGNOSIS — K255 Chronic or unspecified gastric ulcer with perforation: Secondary | ICD-10-CM | POA: Diagnosis not present

## 2020-12-13 DIAGNOSIS — K651 Peritoneal abscess: Secondary | ICD-10-CM | POA: Diagnosis not present

## 2020-12-13 LAB — COMPREHENSIVE METABOLIC PANEL
ALT: 39 U/L (ref 0–44)
AST: 89 U/L — ABNORMAL HIGH (ref 15–41)
Albumin: 2.1 g/dL — ABNORMAL LOW (ref 3.5–5.0)
Alkaline Phosphatase: 221 U/L — ABNORMAL HIGH (ref 38–126)
Anion gap: 5 (ref 5–15)
BUN: 29 mg/dL — ABNORMAL HIGH (ref 6–20)
CO2: 28 mmol/L (ref 22–32)
Calcium: 7.7 mg/dL — ABNORMAL LOW (ref 8.9–10.3)
Chloride: 101 mmol/L (ref 98–111)
Creatinine, Ser: <0.3 mg/dL — ABNORMAL LOW (ref 0.61–1.24)
Glucose, Bld: 146 mg/dL — ABNORMAL HIGH (ref 70–99)
Potassium: 3.9 mmol/L (ref 3.5–5.1)
Sodium: 134 mmol/L — ABNORMAL LOW (ref 135–145)
Total Bilirubin: 2.3 mg/dL — ABNORMAL HIGH (ref 0.3–1.2)
Total Protein: 4.9 g/dL — ABNORMAL LOW (ref 6.5–8.1)

## 2020-12-13 LAB — CBC WITH DIFFERENTIAL/PLATELET
Abs Immature Granulocytes: 0.13 10*3/uL — ABNORMAL HIGH (ref 0.00–0.07)
Basophils Absolute: 0 10*3/uL (ref 0.0–0.1)
Basophils Relative: 0 %
Eosinophils Absolute: 0 10*3/uL (ref 0.0–0.5)
Eosinophils Relative: 0 %
HCT: 28.2 % — ABNORMAL LOW (ref 39.0–52.0)
Hemoglobin: 9.4 g/dL — ABNORMAL LOW (ref 13.0–17.0)
Immature Granulocytes: 4 %
Lymphocytes Relative: 9 %
Lymphs Abs: 0.3 10*3/uL — ABNORMAL LOW (ref 0.7–4.0)
MCH: 27.6 pg (ref 26.0–34.0)
MCHC: 33.3 g/dL (ref 30.0–36.0)
MCV: 82.9 fL (ref 80.0–100.0)
Monocytes Absolute: 0.4 10*3/uL (ref 0.1–1.0)
Monocytes Relative: 14 %
Neutro Abs: 2.3 10*3/uL (ref 1.7–7.7)
Neutrophils Relative %: 73 %
Platelets: 35 10*3/uL — ABNORMAL LOW (ref 150–400)
RBC: 3.4 MIL/uL — ABNORMAL LOW (ref 4.22–5.81)
RDW: 20.8 % — ABNORMAL HIGH (ref 11.5–15.5)
Smear Review: NORMAL
WBC: 3.1 10*3/uL — ABNORMAL LOW (ref 4.0–10.5)
nRBC: 4.8 % — ABNORMAL HIGH (ref 0.0–0.2)

## 2020-12-13 LAB — PREPARE PLATELET PHERESIS: Unit division: 0

## 2020-12-13 LAB — SURGICAL PATHOLOGY

## 2020-12-13 LAB — GLUCOSE, CAPILLARY
Glucose-Capillary: 118 mg/dL — ABNORMAL HIGH (ref 70–99)
Glucose-Capillary: 120 mg/dL — ABNORMAL HIGH (ref 70–99)
Glucose-Capillary: 126 mg/dL — ABNORMAL HIGH (ref 70–99)
Glucose-Capillary: 148 mg/dL — ABNORMAL HIGH (ref 70–99)
Glucose-Capillary: 160 mg/dL — ABNORMAL HIGH (ref 70–99)

## 2020-12-13 LAB — MAGNESIUM: Magnesium: 2.3 mg/dL (ref 1.7–2.4)

## 2020-12-13 LAB — TRIGLYCERIDES: Triglycerides: 151 mg/dL — ABNORMAL HIGH (ref <150)

## 2020-12-13 LAB — BPAM PLATELET PHERESIS
Blood Product Expiration Date: 202209082359
ISSUE DATE / TIME: 202209070918
Unit Type and Rh: 6200

## 2020-12-13 LAB — PHOSPHORUS: Phosphorus: 2.9 mg/dL (ref 2.5–4.6)

## 2020-12-13 MED ORDER — M.V.I. ADULT IV INJ
INJECTION | INTRAVENOUS | Status: AC
  Filled 2020-12-13: qty 1020

## 2020-12-13 NOTE — Progress Notes (Signed)
PHARMACY - TOTAL PARENTERAL NUTRITION CONSULT NOTE   Indication: perforated gastric ulcer  Patient Measurements: Height: '5\' 10"'$  (177.8 cm) Weight: 61.3 kg (135 lb 2.3 oz) IBW/kg (Calculated) : 73 TPN AdjBW (KG): 59.3 Body mass index is 19.39 kg/m.  Assessment:  admitted multiple times recently with hemoptysis and gastric bleeding s/p EGD and clipping in fundus, with cavitary pneumonia, with seizures.  Has history of drug use and was cocaine positive on one of his admissions.  Has also hx of Felty syndrome with neutropenia.  He has surgical history of splenectomy in 2017 and exploratory laparotomy with Phillip Heal patch repair of a pyloric channel perforated ulcer in 2021  Glucose / Insulin: on dexamethasone BG 146 - 177 no hx DM Electrolytes: WNL Renal: Scr < 1, stable Hepatic: ASt/ALT 76/23  09/07; TG 191-->418-->151 Intake / Output; MIVF: no MIVF GI Imaging:perforated gastric ulcer GI Surgeries / Procedures: s/p robotic assisted repair of gastric perforation  ABX: meropenem (ID following)  Central access: 12/07/20 TPN start date: 12/07/20   Nutritional Goals: Goal TPN rate is 85 mL/hr (provides 102 g of protein and 2060 kcals per day)  RD Assessment: Estimated Needs Total Energy Estimated Needs: 1900-2200kcal/day Total Protein Estimated Needs: 95-110g/day Total Fluid Estimated Needs: 1.8-2.1L/day  Current Nutrition:  NPO  Plan:  Continue TPN at goal rate: 85 mL/hr at 1800 (total volume including overfill: 2140 mL)  Nutritional components: Amino acids (Travasol 10%): 102 grams Lipids (20% SMOF): 49 grams Dextrose: 306 grams kCal: 1938 / 24h Electrolytes in TPN: Na 75 mEq/L, K 63mq/L, Ca 550m/L, Mg 59m48mL, and Phos 159m27mL. Cl:Ac 1:1.   Continue Sensitive q6h SSI and adjust as needed. On oral prednisone, no hx DM   Monitor TPN labs on Mon/Thurs  RodnDallie PilesarmD, BCPS 12/13/2020,7:14 AM

## 2020-12-13 NOTE — Progress Notes (Signed)
Patient ID: Marcus [name], male   DOB: 1960-10-13, 60 y.o.   MRN: [id number]  PROGRESS NOTE    Marcus [name]  [id number] DOB: Dec 06, 1960 DOA: 12/02/2020 PCP: [name], FNP   Brief Narrative:  60 y.o. male with history of COPD, rheumatoid arthritis, Felty syndrome, chronic respiratory failure on 2 L of oxygen, tobacco use, history of MRSA pneumonia presented secondary to shortness of breath and was found to have gastric perforation with concern for intra-abdominal abscess.  Patient was started on empiric antibiotics and general surgery was consulted.  He was treated with conservative management.  TPN was started for nutrition.  During hospitalization, he developed worsening neutropenia in the setting of underlying Felty syndrome requiring Granix by hematology/oncology.  He also developed significant thrombocytopenia without bleeding.  HIT panel was negative.  Repeat CT of abdomen/pelvis with persistent perforation/fluid collection.  He underwent surgical intervention on 12/11/2020.  Assessment & Plan:   Sepsis: Present on admission -Cultures negative so far.  Currently hemodynamically stable.  Perforated gastric ulcer with intra-abdominal abscess -Initially treated with conservatively with IV antibiotics and TPN. -Repeat CT of abdomen and pelvis on 12/10/2020 showed evidence of persistent perforation in addition to fluid collection. -Status post surgical intervention on 12/11/2020.  Currently has NG tube.  Currently n.p.o.; diet advancement as per general surgery.  Wound care as per general surgery. -Continue IV meropenem.  ID following.  Felty syndrome Neutropenia Rheumatoid arthritis Status post splenectomy -Heme-onc on board.  Patient has received Granix for neutropenia.  Subsequently neutropenia has resolved.  Granix has been stopped.  Acute thrombocytopenia -Questionable cause.  He tested negative so far.  Lovenox discontinued. -Has received platelet transfusion during this  hospitalization.  Platelets 35 today.  Received a unit of platelets again on 12/12/2020.  Elevated LFTs with hyperbilirubinemia -Concerning for biliary pathology.  CT abdomen/pelvis without evidence of biliary disease.  Improving slowly.  Monitor  COPD -Stable.  Continue current inhaler regimen.  Abdominal aortic aneurysm 3.2 x 3.0 cm.  Recommendation for follow-up ultrasound every 3 years   DVT prophylaxis: SCDs Code Status: Full Family Communication: None at bedside Disposition Plan: Status is: Inpatient  Remains inpatient appropriate because:Inpatient level of care appropriate due to severity of illness  Dispo: The patient is from: Home              Anticipated d/c is to: Home              Patient currently is not medically stable to d/c.   Difficult to place patient No  Consultants: General surgery; hematology/oncology; ID  Procedures: Robotic assisted partial sleeve gastrectomy/cholecystectomy/incisional hernia repair on 12/11/2020  Antimicrobials:  Anti-infectives (From admission, onward)    Start     Dose/Rate Route Frequency Ordered Stop   12/08/20 1400  meropenem (MERREM) 1 g in sodium chloride 0.9 % 100 mL IVPB        1 g 200 mL/hr over 30 Minutes Intravenous Every 8 hours 12/07/20 1611     12/07/20 1600  ertapenem (INVANZ) 1,000 mg in sodium chloride 0.9 % 100 mL IVPB  Status:  Discontinued       Note to Pharmacy: Thrombocytopenia possibly due to cefepime, hematology suggested to change antibiotics   1 g 200 mL/hr over 30 Minutes Intravenous Every 24 hours 12/07/20 0836 12/07/20 1611   12/03/20 1400  ceFEPIme (MAXIPIME) 2 g in sodium chloride 0.9 % 100 mL IVPB  Status:  Discontinued        2 g 200  mL/hr over 30 Minutes Intravenous Every 8 hours 12/03/20 0637 12/07/20 0836   12/03/20 0545  ceFEPIme (MAXIPIME) 2 g in sodium chloride 0.9 % 100 mL IVPB        2 g 200 mL/hr over 30 Minutes Intravenous  Once 12/03/20 0532 12/03/20 0638   12/03/20 0545  metroNIDAZOLE  (FLAGYL) IVPB 500 mg  Status:  Discontinued        500 mg 100 mL/hr over 60 Minutes Intravenous Every 12 hours 12/03/20 0532 12/07/20 1534   12/03/20 0330  piperacillin-tazobactam (ZOSYN) IVPB 3.375 g        3.375 g 100 mL/hr over 30 Minutes Intravenous  Once 12/03/20 0324 12/03/20 0401        Subjective: Patient seen and examined at bedside.  No overnight fever, vomiting or chest pain reported.  Complains of intermittent abdominal pain.  Poor historian. Objective: Vitals:   12/12/20 1215 12/12/20 1951 12/13/20 0447 12/13/20 0520  BP: 112/72 114/75  135/90  Pulse: 66 73  81  Resp: '20 20  19  '$ Temp: 98.2 F (36.8 C) (!) 97.3 F (36.3 C)  (!) 97.5 F (36.4 C)  TempSrc: Oral Oral  Oral  SpO2: 95% 92%  96%  Weight:   61.3 kg   Height:        Intake/Output Summary (Last 24 hours) at 12/13/2020 0804 Last data filed at 12/13/2020 0526 Gross per 24 hour  Intake 3441.09 ml  Output 1935 ml  Net 1506.09 ml    Filed Weights   12/11/20 0502 12/12/20 0442 12/13/20 0447  Weight: 59.6 kg 62.6 kg 61.3 kg    Examination:  General exam: Currently on 3 L oxygen via nasal cannula.  No acute distress.  Looks chronically ill and deconditioned. ENT: NG tube is still present.   Respiratory system: Decreased breath sounds at bases bilaterally Cardiovascular system: Rate controlled, S1-S2 heard gastrointestinal system: Abdomen is slightly distended, dressing present with surrounding tenderness.  Sluggish bowel sounds extremities: Mild lower extremity edema present; no clubbing Central nervous system: Awake; still very slow to respond.  Poor historian.  No focal neurological deficits.  Moves extremities Skin: No obvious petechiae/other lesions  psychiatry: Affect is flat.  Does not participate in conversation much.  Data Reviewed: I have personally reviewed following labs and imaging studies  CBC: Recent Labs  Lab 12/09/20 0435 12/10/20 0443 12/11/20 0644 12/11/20 1304 12/11/20 1840  12/12/20 0328 12/13/20 0703  WBC 1.3* 4.1 11.9*  --  12.2* 9.9 3.1*  NEUTROABS 0.5* 2.3 10.8*  --   --  8.9* 2.3  HGB 13.9 15.2 12.7*  --  11.2* 9.9* 9.4*  HCT 44.0 44.9 37.7*  --  33.4* 29.0* 28.2*  MCV 95.2 81.2 82.0  --  82.9 84.3 82.9  PLT 56* 20* 21* 39* 27* 23* 35*    Basic Metabolic Panel: Recent Labs  Lab 12/07/20 0440 12/08/20 0530 12/09/20 0700 12/10/20 0443 12/10/20 0813 12/11/20 0644 12/12/20 0328 12/13/20 0525  NA 138 142 137 132*  --  131* 132* 134*  K 4.5 4.7 4.8 6.1* 5.2* 4.5 4.6 3.9  CL 110 111 103 99  --  100 100 101  CO2 '23 27 28 28  '$ --  '27 27 28  '$ GLUCOSE 120* 130* 139* 86  --  147* 274* 146*  BUN '12 17 18 '$ 24*  --  24* 35* 29*  CREATININE 0.51* 0.54* 0.41* <0.30*  --  <0.30* 0.43* <0.30*  CALCIUM 8.1* 8.1* 8.2* 8.2*  --  7.9*  7.7* 7.7*  MG 2.1 2.3 2.4 2.3  --   --   --  2.3  PHOS 3.7 4.5 3.8 2.3*  --  3.8  --  2.9    GFR: CrCl cannot be calculated (This lab value cannot be used to calculate CrCl because it is not a number: <0.30). Liver Function Tests: Recent Labs  Lab 12/10/20 0443 12/11/20 0644 12/12/20 0328 12/13/20 0525  AST 55* 35 76* 89*  ALT '18 12 23 '$ 39  ALKPHOS 311* 214* 177* 221*  BILITOT 4.2* 2.7* 3.2* 2.3*  PROT 5.6* 5.1* 4.9* 4.9*  ALBUMIN 2.3* 2.0* 2.2* 2.1*    No results for input(s): LIPASE, AMYLASE in the last 168 hours. No results for input(s): AMMONIA in the last 168 hours. Coagulation Profile: Recent Labs  Lab 12/07/20 1020 12/11/20 1304 12/11/20 1840 12/12/20 0328  INR 1.7* 1.0 1.1 1.1    Cardiac Enzymes: No results for input(s): CKTOTAL, CKMB, CKMBINDEX, TROPONINI in the last 168 hours. BNP (last 3 results) No results for input(s): PROBNP in the last 8760 hours. HbA1C: No results for input(s): HGBA1C in the last 72 hours. CBG: Recent Labs  Lab 12/12/20 0810 12/12/20 1126 12/12/20 1828 12/12/20 2335 12/13/20 0517  GLUCAP 200* 174* 186* 177* 160*    Lipid Profile: Recent Labs    12/13/20 0525   TRIG 151*    Thyroid Function Tests: No results for input(s): TSH, T4TOTAL, FREET4, T3FREE, THYROIDAB in the last 72 hours. Anemia Panel: No results for input(s): VITAMINB12, FOLATE, FERRITIN, TIBC, IRON, RETICCTPCT in the last 72 hours. Sepsis Labs: No results for input(s): PROCALCITON, LATICACIDVEN in the last 168 hours.  Recent Results (from the past 240 hour(s))  MRSA Next Gen by PCR, Nasal     Status: None   Collection Time: 12/10/20  9:20 PM   Specimen: Nasal Mucosa; Nasal Swab  Result Value Ref Range Status   MRSA by PCR Next Gen NOT DETECTED NOT DETECTED Final    Comment: (NOTE) The GeneXpert MRSA Assay (FDA approved for NASAL specimens only), is one component of a comprehensive MRSA colonization surveillance program. It is not intended to diagnose MRSA infection nor to guide or monitor treatment for MRSA infections. Test performance is not FDA approved in patients less than 44 years old. Performed at [facility], [address] [place], [place] [address]           Radiology Studies: DG Abd 1 View  Result Date: 12/12/2020 CLINICAL DATA:  NG tube placement EXAM: ABDOMEN - 1 VIEW COMPARISON:  06/10/2020 FINDINGS: NG tube tip is in the stomach. IMPRESSION: NG tube in the stomach. Electronically Signed   By: Rolm Baptise M.D.   On: 12/12/2020 00:51   DG Abd Portable 1V  Result Date: 12/12/2020 CLINICAL DATA:  NG tube advancement EXAM: PORTABLE ABDOMEN - 1 VIEW COMPARISON:  12/12/2020 at 0132 hours FINDINGS: Limited radiograph of the lower chest and upper abdomen was obtained for the purposes of enteric tube localization. Enteric tube is seen coursing below the diaphragm with distal tip and side port terminating within the expected location of the gastric body. Surgical drain in the left upper quadrant. IMPRESSION: Enteric tube terminates within the gastric body. Electronically Signed   By: Davina Poke D.O.   On: 12/12/2020 11:27   DG Abd Portable  2V  Result Date: 12/12/2020 CLINICAL DATA:  Perforated bowel.  NGT adjustment. EXAM: PORTABLE ABDOMEN - 2 VIEW COMPARISON:  Radiograph earlier today.  CT 12/10/2020 FINDINGS: The endotracheal  tube has been retracted, tip is in the region of the gastroesophageal junction, side-port in the stomach. Again seen contrast within the colon. Surgical drain in the left upper quadrant. IMPRESSION: Enteric tube tip in the region of the gastroesophageal junction, side-port in the stomach. Recommend advancement of at least 7-1/2 cm to place the side-port below the diaphragm. Electronically Signed   By: Keith Rake M.D.   On: 12/12/2020 01:53        Scheduled Meds:  Chlorhexidine Gluconate Cloth  6 each Topical Daily   insulin aspart  0-9 Units Subcutaneous Q6H   mometasone-formoterol  2 puff Inhalation BID   multivitamin with minerals  1 tablet Oral Daily   multivitamin-lutein  1 capsule Per Tube Daily   nicotine  21 mg Transdermal Daily   pantoprazole (PROTONIX) IV  40 mg Intravenous BID   predniSONE  10 mg Oral BID   sodium chloride flush  10-40 mL Intracatheter Q12H   vitamin A  20,000 Units Per NG tube Daily   Continuous Infusions:  meropenem (MERREM) IV 1 g (12/13/20 0526)   TPN ADULT (ION) 85 mL/hr at 12/12/20 1904          Aline August, MD [facility] 12/13/2020, 8:04 AM

## 2020-12-13 NOTE — TOC Progression Note (Signed)
Transition of Care [facility]) - Progression Note    Patient Details  Name: Marcus [name] MRN: [id number] Date of Birth: 06-16-1960  Transition of Care [facility]) CM/SW [place], LCSW Phone Number: 12/13/2020, 8:46 AM  Clinical Narrative:   PT recommending SNF placement. Patient still with TPN and NG. Will follow progress and discuss SNF placement with patient once medical condition improved. Medicaid will likely be a barrier to placement.  Expected Discharge Plan: Home/Self Care Barriers to Discharge: Continued Medical Work up  Expected Discharge Plan and Services Expected Discharge Plan: Home/Self Care       Living arrangements for the past 2 months: Single Family Home                                       Social Determinants of Health (SDOH) Interventions    Readmission Risk Interventions Readmission Risk Prevention Plan 12/04/2020 11/02/2020 09/20/2020  Transportation Screening Complete Complete Complete  PCP or Specialist Appt within 3-5 Days - - -  HRI or [place] for [place] - - -  Medication Review Press photographer) Complete Complete Complete  PCP or Specialist appointment within 3-5 days of discharge Complete Complete Complete  HRI or [place] - Complete Complete  SW Recovery Care/Counseling Consult Complete Complete -  Palliative Care Screening Not Applicable Not Applicable Not [place] Not Applicable Not Applicable Not Applicable  Some recent data might be hidden

## 2020-12-13 NOTE — Progress Notes (Signed)
ID Pt in bed No distress Says he is feeling okay but weak O/E awake and alert Patient Vitals for the past 24 hrs:  BP Temp Temp src Pulse Resp SpO2 Weight  12/13/20 1550 126/83 97.6 F (36.4 C) Oral 73 18 96 % --  12/13/20 0941 127/78 98 F (36.7 C) Oral 79 (!) 25 93 % --  12/13/20 0520 135/90 (!) 97.5 F (36.4 C) Oral 81 19 96 % --  12/13/20 0447 -- -- -- -- -- -- 61.3 kg  12/12/20 1951 114/75 (!) 97.3 F (36.3 C) Oral 73 20 92 % --    NG tube- coffee ground fluid Rt PICC Chest b/l air entry [id number] Abd -binder present- JP drain bloody fluid CNS non focal  Labs CBC Latest Ref Rng & Units 12/13/2020 12/12/2020 12/11/2020  WBC 4.0 - 10.5 K/uL 3.1(L) 9.9 12.2(H)  Hemoglobin 13.0 - 17.0 g/dL 9.4(L) 9.9(L) 11.2(L)  Hematocrit 39.0 - 52.0 % 28.2(L) 29.0(L) 33.4(L)  Platelets 150 - 400 K/uL 35(L) 23(LL) 27(LL)     CMP Latest Ref Rng & Units 12/13/2020 12/12/2020 12/11/2020  Glucose 70 - 99 mg/dL 146(H) 274(H) 147(H)  BUN 6 - 20 mg/dL 29(H) 35(H) 24(H)  Creatinine 0.61 - 1.24 mg/dL <0.30(L) 0.43(L) <0.30(L)  Sodium 135 - 145 mmol/L 134(L) 132(L) 131(L)  Potassium 3.5 - 5.1 mmol/L 3.9 4.6 4.5  Chloride 98 - 111 mmol/L 101 100 100  CO2 22 - 32 mmol/L '28 27 27  '$ Calcium 8.9 - 10.3 mg/dL 7.7(L) 7.7(L) 7.9(L)  Total Protein 6.5 - 8.1 g/dL 4.9(L) 4.9(L) 5.1(L)  Total Bilirubin 0.3 - 1.2 mg/dL 2.3(H) 3.2(H) 2.7(H)  Alkaline Phos 38 - 126 U/L 221(H) 177(H) 214(H)  AST 15 - 41 U/L 89(H) 76(H) 35  ALT 0 - 44 U/L 39 23 12       Impression/recommendation Gastric perforation at th site of previous staples with posterior collection- s/p robot assisted partial sleeve gastrectomy Incisional hernia repair and cholecystectomy were also done at the same time Continue meropenem for 3 more days  GGI bleed- coffee grounds in NG tube  Severe thrombocytopenia due to Felty- got platelet transfusion  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

## 2020-12-13 NOTE — Progress Notes (Signed)
AUM TEER   DOB:Apr 15, 1960   [id number]    Subjective: No bleeding issues overnight.  No fevers or chills.  No acute events.  Complains of abdominal pain.  Objective:  Vitals:   12/13/20 1550 12/13/20 1920  BP: 126/83 136/88  Pulse: 73 78  Resp: 18 20  Temp: 97.6 F (36.4 C) 97.8 F (36.6 C)  SpO2: 96% 100%     Intake/Output Summary (Last 24 hours) at 12/13/2020 2224 Last data filed at 12/13/2020 2121 Gross per 24 hour  Intake 1946.41 ml  Output 2245 ml  Net -298.59 ml    Physical Exam Vitals and nursing note reviewed.  Constitutional:      Comments: NG tube in place; alone.   HENT:     Head: Normocephalic and atraumatic.     Mouth/Throat:     Pharynx: Oropharynx is clear.  Eyes:     Extraocular Movements: Extraocular movements intact.     Pupils: Pupils are equal, round, and reactive to light.  Cardiovascular:     Rate and Rhythm: Normal rate and regular rhythm.  Pulmonary:     Comments: Decreased breath sounds bilaterally.  Abdominal:     Palpations: Abdomen is soft.     Comments: Abdominal binder in place.  Musculoskeletal:        General: Normal range of motion.     Cervical back: Normal range of motion.  Skin:    General: Skin is warm.  Neurological:     General: No focal deficit present.     Mental Status: He is alert and oriented to person, place, and time.  Psychiatric:        Behavior: Behavior normal.        Judgment: Judgment normal.     Labs:  Lab Results  Component Value Date   WBC 3.1 (L) 12/13/2020   HGB 9.4 (L) 12/13/2020   HCT 28.2 (L) 12/13/2020   MCV 82.9 12/13/2020   PLT 35 (L) 12/13/2020   NEUTROABS 2.3 12/13/2020    Lab Results  Component Value Date   NA 134 (L) 12/13/2020   K 3.9 12/13/2020   CL 101 12/13/2020   CO2 28 12/13/2020    Studies:  DG Abd 1 View  Result Date: 12/12/2020 CLINICAL DATA:  NG tube placement EXAM: ABDOMEN - 1 VIEW COMPARISON:  06/10/2020 FINDINGS: NG tube tip is in the stomach. IMPRESSION: NG  tube in the stomach. Electronically Signed   By: Rolm Baptise M.D.   On: 12/12/2020 00:51   DG Abd Portable 1V  Result Date: 12/12/2020 CLINICAL DATA:  NG tube advancement EXAM: PORTABLE ABDOMEN - 1 VIEW COMPARISON:  12/12/2020 at 0132 hours FINDINGS: Limited radiograph of the lower chest and upper abdomen was obtained for the purposes of enteric tube localization. Enteric tube is seen coursing below the diaphragm with distal tip and side port terminating within the expected location of the gastric body. Surgical drain in the left upper quadrant. IMPRESSION: Enteric tube terminates within the gastric body. Electronically Signed   By: Davina Poke D.O.   On: 12/12/2020 11:27   DG Abd Portable 2V  Result Date: 12/12/2020 CLINICAL DATA:  Perforated bowel.  NGT adjustment. EXAM: PORTABLE ABDOMEN - 2 VIEW COMPARISON:  Radiograph earlier today.  CT 12/10/2020 FINDINGS: The endotracheal tube has been retracted, tip is in the region of the gastroesophageal junction, side-port in the stomach. Again seen contrast within the colon. Surgical drain in the left upper quadrant. IMPRESSION: Enteric tube tip in the  region of the gastroesophageal junction, side-port in the stomach. Recommend advancement of at least 7-1/2 cm to place the side-port below the diaphragm. Electronically Signed   By: Keith Rake M.D.   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  Currently monitor off Granix.  Reinstitute Granix if ANC less than 1500.  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  Platelets in the 30s-slight improvement.  S/p surgery I think patient's coagulopathy should  improve/DIC should improve.  Recheck basic labs in the morning.  Transfuse cryoprecipitate if less than 100.   #Gastric perforation s/p surgery-posterior collection- s/p robot assisted partial sleeve gastrectomy; Incisional hernia repair/ cholecystectomy-on antibiotics per ID    Cammie Sickle, MD 12/13/2020

## 2020-12-13 NOTE — Progress Notes (Signed)
[facility] Day(s): 10.   Post op day(s): 2 Days Post-Op.   Interval History:  Patient seen and examined No acute events or new complaints overnight.  Patient reports continued incisional soreness No fever, chills, nausea CBC is in process this morning Renal function remains normal; sCr <0.30; UO - 1.4L Improved hyponatremia to 134 o/w no significant electrolyte derangements NGT remains in good position; output 350 ccs Surgical drain with 135 ccs out; sanguinous but thin Continues NPO and on TPN  Vital signs in last 24 hours: [min-max] current  Temp:  [97.3 F (36.3 C)-98.4 F (36.9 C)] 97.5 F (36.4 C) (09/08 0520) Pulse Rate:  [65-81] 81 (09/08 0520) Resp:  [15-20] 19 (09/08 0520) BP: (109-135)/(62-90) 135/90 (09/08 0520) SpO2:  [92 %-100 %] 96 % (09/08 0520) Weight:  [61.3 kg] 61.3 kg (09/08 0447)     Height: '5\' 10"'$  (177.8 cm) Weight: 61.3 kg BMI (Calculated): 19.39   Intake/Output last 2 shifts:  09/07 0701 - 09/08 0700 In: 3441.1 [I.V.:2248.1; [id number]; IV [id number] Out: 1935 [Urine:1450; Emesis/NG output:350; Drains:135]   Physical Exam:  Constitutional: alert, cooperative and no distress  HEENT: NGT in place; good positioning Respiratory: breathing non-labored at rest  Cardiovascular: regular rate and sinus rhythm  Gastrointestinal: Soft, incisional soreness, non-distended, no rebound/guarding. Surgical drain in place, thin sanguinous Integumentary: Laparoscopic incisions are CDI with dermabond, no erythema or drainage   Labs:  CBC Latest Ref Rng & Units 12/12/2020 12/11/2020 12/11/2020  WBC 4.0 - 10.5 K/uL 9.9 12.2(H) -  Hemoglobin 13.0 - 17.0 g/dL 9.9(L) 11.2(L) -  Hematocrit 39.0 - 52.0 % 29.0(L) 33.4(L) -  Platelets 150 - 400 K/uL 23(LL) 27(LL) 39(L)   CMP Latest Ref Rng & Units 12/13/2020 12/12/2020 12/11/2020  Glucose 70 - 99 mg/dL 146(H) 274(H) 147(H)  BUN 6 - 20 mg/dL 29(H) 35(H) 24(H)  Creatinine 0.61 -  1.24 mg/dL <0.30(L) 0.43(L) <0.30(L)  Sodium 135 - 145 mmol/L 134(L) 132(L) 131(L)  Potassium 3.5 - 5.1 mmol/L 3.9 4.6 4.5  Chloride 98 - 111 mmol/L 101 100 100  CO2 22 - 32 mmol/L '28 27 27  '$ Calcium 8.9 - 10.3 mg/dL 7.7(L) 7.7(L) 7.9(L)  Total Protein 6.5 - 8.1 g/dL 4.9(L) 4.9(L) 5.1(L)  Total Bilirubin 0.3 - 1.2 mg/dL 2.3(H) 3.2(H) 2.7(H)  Alkaline Phos 38 - 126 U/L 221(H) 177(H) 214(H)  AST 15 - 41 U/L 89(H) 76(H) 35  ALT 0 - 44 U/L 39 23 12     Imaging studies: No new pertinent imaging studies   Assessment/Plan:  60 y.o. male 2 Days Post-Op s/p robotic assisted laparoscopic partial sleeve gastrectomy, cholecystectomy, and incisional hernia repair for proximal posterior gastric perforation with abscess and cholecystitis               - Continue strict NPO until Friday; we will plan on reassessing repair at that time             - Continue TPN at goal rate             - Continue NGT decompression; monitor and record output              - Continue IV Abx (meropenem); ID on board  - Monitor abdominal examination; on-going bowel function - Pain control prn; antiemetics prn - Okay to ambulate with PT; recommending SNF             - Appreciate hematology/oncology input   All of the above findings  and recommendations were discussed with the patient, and the medical team, and all of patient's questions were answered to his expressed satisfaction.  -- Edison Simon, PA-C [facility] 12/13/2020, 7:39 AM [id number] M-F: 7am - 4pm

## 2020-12-14 ENCOUNTER — Inpatient Hospital Stay: Payer: Medicaid Other

## 2020-12-14 DIAGNOSIS — M0500 Felty's syndrome, unspecified site: Secondary | ICD-10-CM | POA: Diagnosis not present

## 2020-12-14 DIAGNOSIS — K255 Chronic or unspecified gastric ulcer with perforation: Secondary | ICD-10-CM | POA: Diagnosis not present

## 2020-12-14 DIAGNOSIS — K651 Peritoneal abscess: Secondary | ICD-10-CM | POA: Diagnosis not present

## 2020-12-14 LAB — BASIC METABOLIC PANEL
Anion gap: 5 (ref 5–15)
BUN: 24 mg/dL — ABNORMAL HIGH (ref 6–20)
CO2: 27 mmol/L (ref 22–32)
Calcium: 8 mg/dL — ABNORMAL LOW (ref 8.9–10.3)
Chloride: 97 mmol/L — ABNORMAL LOW (ref 98–111)
Creatinine, Ser: <0.3 mg/dL — ABNORMAL LOW (ref 0.61–1.24)
Glucose, Bld: 123 mg/dL — ABNORMAL HIGH (ref 70–99)
Potassium: 4.2 mmol/L (ref 3.5–5.1)
Sodium: 129 mmol/L — ABNORMAL LOW (ref 135–145)

## 2020-12-14 LAB — CBC WITH DIFFERENTIAL/PLATELET
Abs Immature Granulocytes: 0.11 10*3/uL — ABNORMAL HIGH (ref 0.00–0.07)
Basophils Absolute: 0 10*3/uL (ref 0.0–0.1)
Basophils Relative: 0 %
Eosinophils Absolute: 0 10*3/uL (ref 0.0–0.5)
Eosinophils Relative: 0 %
HCT: 29.3 % — ABNORMAL LOW (ref 39.0–52.0)
Hemoglobin: 10 g/dL — ABNORMAL LOW (ref 13.0–17.0)
Immature Granulocytes: 9 %
Lymphocytes Relative: 19 %
Lymphs Abs: 0.2 10*3/uL — ABNORMAL LOW (ref 0.7–4.0)
MCH: 28.2 pg (ref 26.0–34.0)
MCHC: 34.1 g/dL (ref 30.0–36.0)
MCV: 82.5 fL (ref 80.0–100.0)
Monocytes Absolute: 0.6 10*3/uL (ref 0.1–1.0)
Monocytes Relative: 49 %
Neutro Abs: 0.3 10*3/uL — CL (ref 1.7–7.7)
Neutrophils Relative %: 23 %
Platelets: 36 10*3/uL — ABNORMAL LOW (ref 150–400)
RBC: 3.55 MIL/uL — ABNORMAL LOW (ref 4.22–5.81)
RDW: 20.7 % — ABNORMAL HIGH (ref 11.5–15.5)
Smear Review: NORMAL
WBC: 1.3 10*3/uL — CL (ref 4.0–10.5)
nRBC: 8.7 % — ABNORMAL HIGH (ref 0.0–0.2)

## 2020-12-14 LAB — FIBRINOGEN: Fibrinogen: 446 mg/dL (ref 210–475)

## 2020-12-14 LAB — APTT: aPTT: 33 seconds (ref 24–36)

## 2020-12-14 LAB — GLUCOSE, CAPILLARY
Glucose-Capillary: 128 mg/dL — ABNORMAL HIGH (ref 70–99)
Glucose-Capillary: 162 mg/dL — ABNORMAL HIGH (ref 70–99)
Glucose-Capillary: 162 mg/dL — ABNORMAL HIGH (ref 70–99)

## 2020-12-14 LAB — PROTIME-INR
INR: 1 (ref 0.8–1.2)
Prothrombin Time: 12.9 seconds (ref 11.4–15.2)

## 2020-12-14 IMAGING — DX DG CHEST 1V
1 series · 1 of 1 positions shown · non-contrast
Comparison: CT Chest, Abdomen, and Pelvis [DATE] and earlier.

CLINICAL DATA: 60-year-old male with shortness of breath. Recent
gastric perforation, sleeve gastrectomy.

EXAM:
CHEST  1 VIEW

[chest ap]
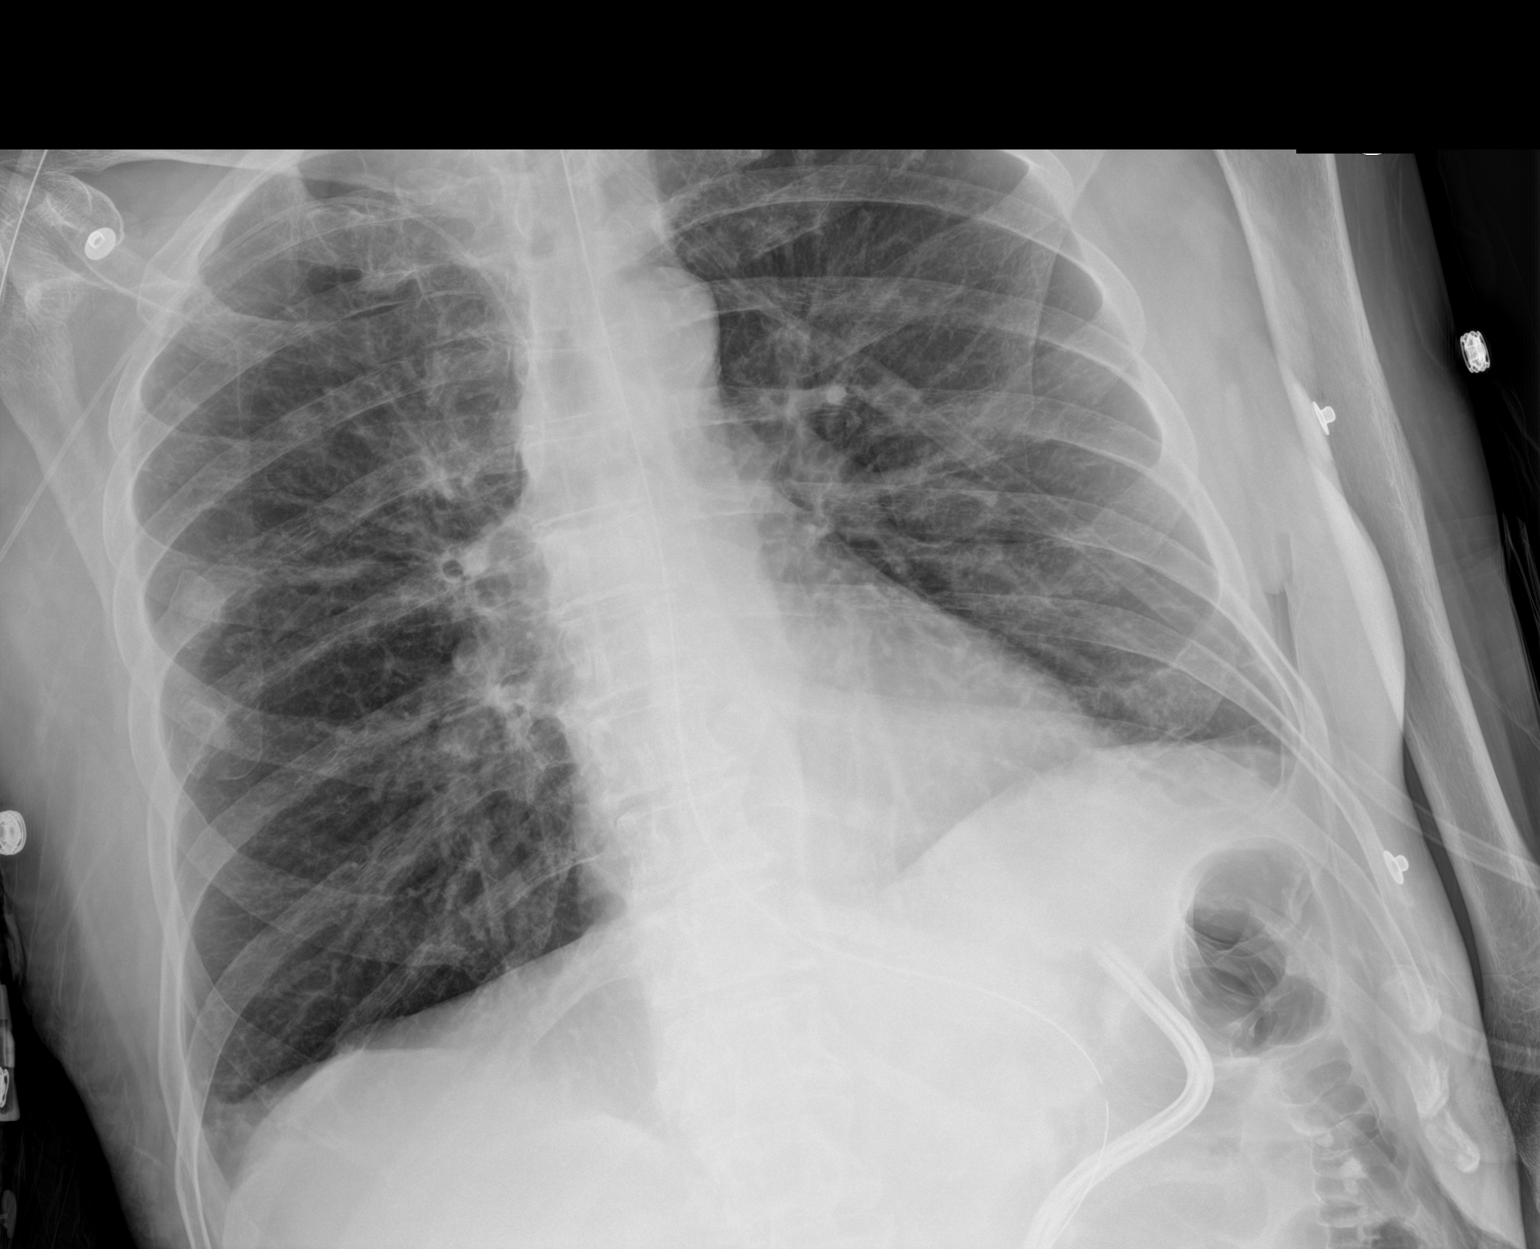

[1 of 1 positions shown; findings below may reference images not displayed]

FINDINGS: Portable AP upright view at [RN] hours. Emphysema demonstrated on
the CT last month. Lower lung volumes today, lung markings appear
stable. No pneumothorax, pleural effusion or acute pulmonary
opacity.

Right PICC line in place, tip at the cavoatrial junction level.
Enteric tube courses to the abdomen with side hole at the level of
the gastric body. Surgical drain redemonstrated in the left upper
quadrant. Visible bowel-gas pattern within normal limits. No acute
osseous abnormality identified. Chronic right sixth and 7th rib
fractures.
IMPRESSION: 1. Chronic lung disease.  No acute cardiopulmonary abnormality.
2. Right PICC line, enteric tube, and left upper quadrant drain in
place with no adverse features.

## 2020-12-14 IMAGING — RF DG UGI W SINGLE CM
4 series · 4 of 4 positions shown · IV contrast (agent unspecified)
Comparison: CT abdomen pelvis, [DATE]

FLUOROSCOPY TIME:  Fluoroscopy Time:  [DATE]

Number of Acquired Spot Images: 4

CLINICAL DATA: Concern for gastric perforation, status post

EXAM:
WATER SOLUBLE UPPER GI SERIES
TECHNIQUE: Single-column upper GI series was performed using water soluble
contrast.
CONTRAST:  Dilute Gastrografin enteric contrast

[Series 1: fluoro_iodine_singleshot_bb · 0.18mm/px · 1 of 1 slices shown (1 of 4)]
[im 1/1]
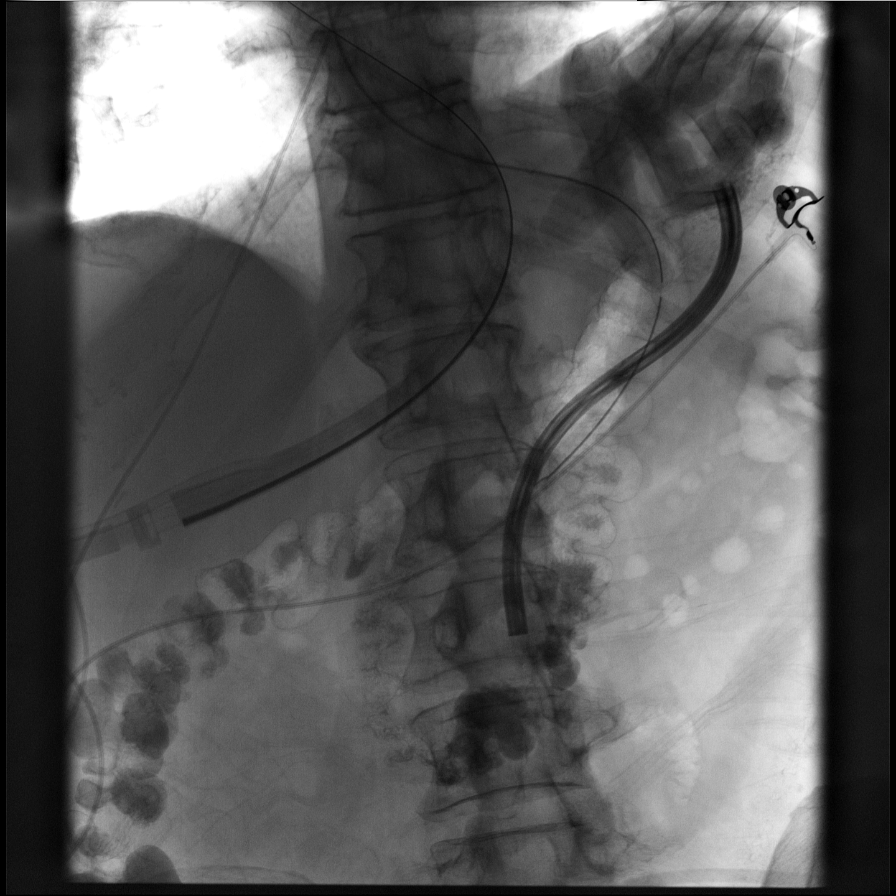

[Series 2: fluoro_iodine_singleshot_bb · 0.18mm/px · 1 of 1 slices shown (2 of 4)]
[im 1/1]
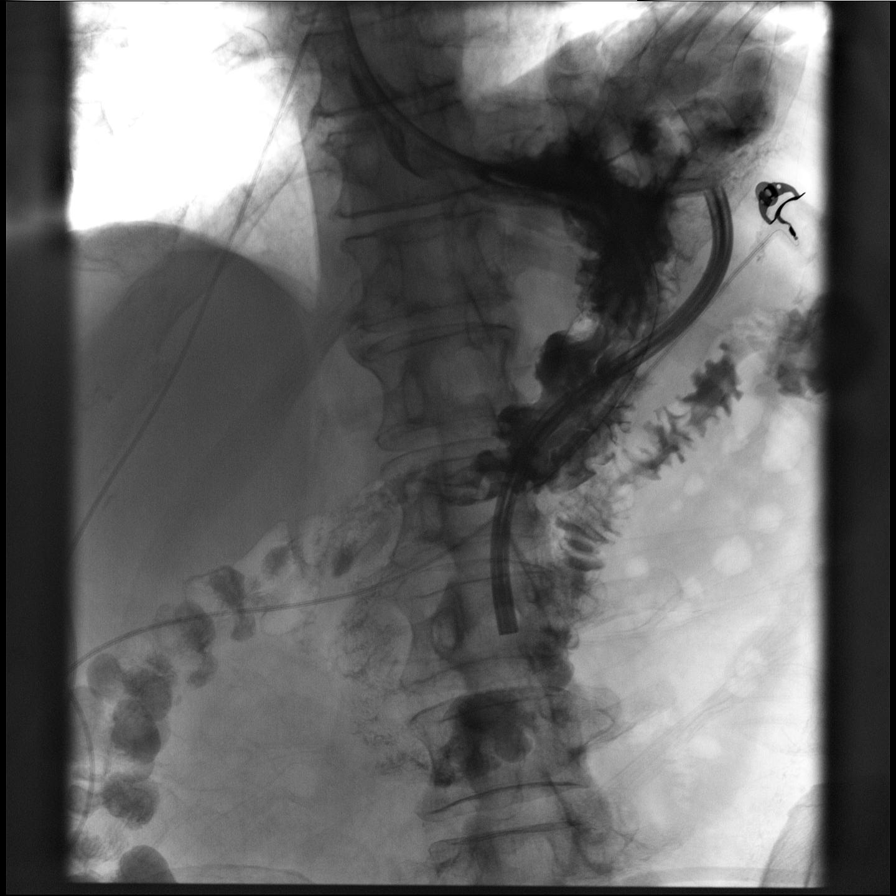

[Series 3: fluoro_iodine_singleshot_bb · 0.18mm/px · 1 of 1 slices shown (3 of 4)]
[im 1/1]
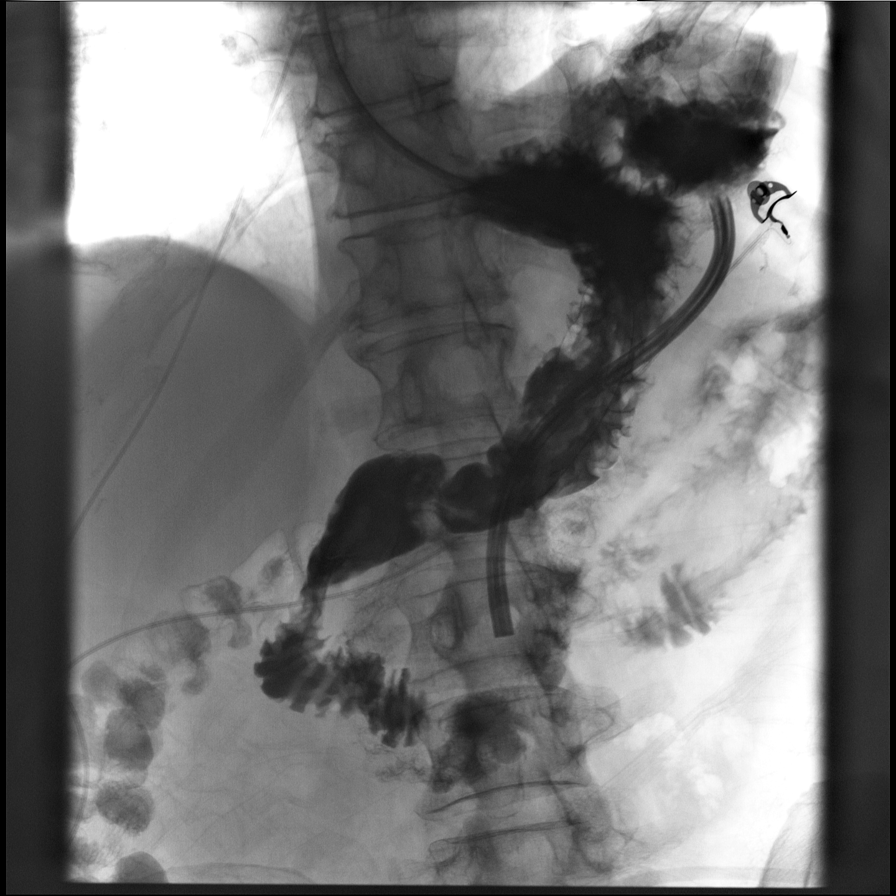

[Series 4: fluoro_iodine_singleshot_bb · 0.18mm/px · 1 of 1 slices shown (4 of 4)]
[im 1/1]
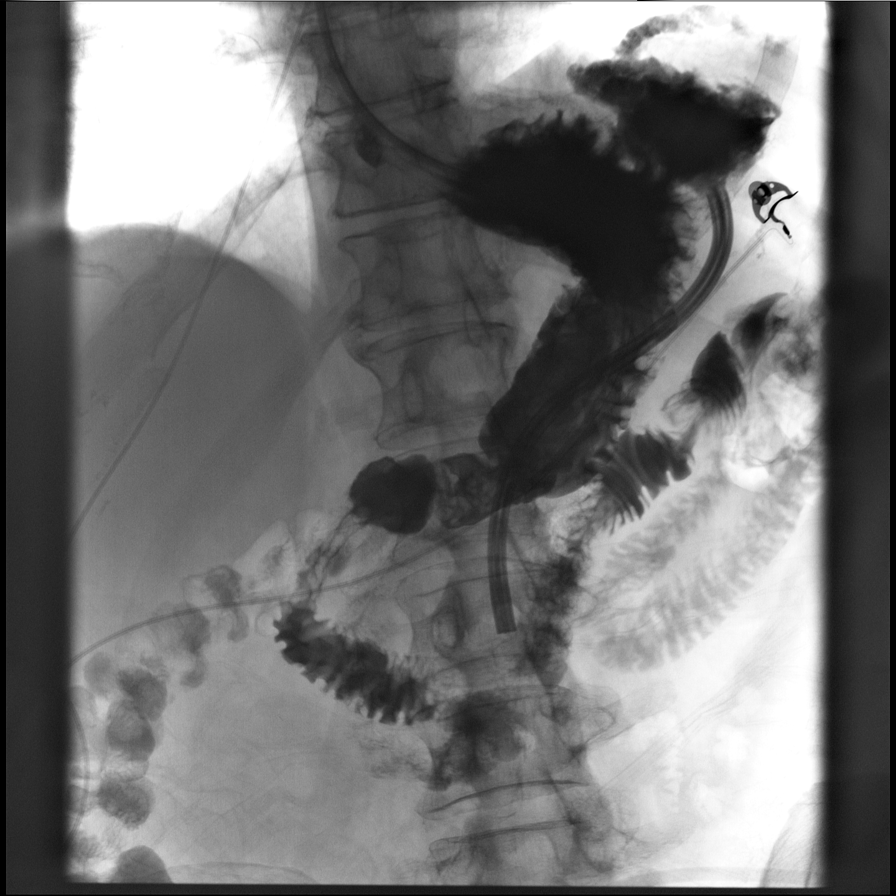

[4 of 4 positions shown; findings below may reference images not displayed]

FINDINGS: Following gentle syringe injection through nasogastric tube, there
is gross contrast leak into an extraluminal collection adjacent to
the gastric fundus, underlying the left hemidiaphragm, in keeping
with findings of prior CT. There is a surgical drain in this
vicinity, tip projecting within this collection. The distal stomach
and proximal small bowel are normal in single contrast appearance.
Following the procedure, residual contrast material was gently
aspirated from the nasogastric tube.
IMPRESSION: Gross contrast leak into an extraluminal collection adjacent to the
gastric fundus, underlying the left hemidiaphragm, in keeping with
appearance and configuration seen on prior CT. There is a surgical
drain in this vicinity, tip projecting within this collection.

## 2020-12-14 MED ORDER — SODIUM CHLORIDE 0.9 % IV BOLUS
250.0000 mL | Freq: Once | INTRAVENOUS | Status: AC
  Administered 2020-12-14: 250 mL via INTRAVENOUS

## 2020-12-14 MED ORDER — FUROSEMIDE 10 MG/ML IJ SOLN
20.0000 mg | Freq: Once | INTRAMUSCULAR | Status: AC
  Administered 2020-12-14: 20 mg via INTRAVENOUS
  Filled 2020-12-14: qty 4

## 2020-12-14 MED ORDER — LABETALOL HCL 5 MG/ML IV SOLN
20.0000 mg | Freq: Once | INTRAVENOUS | Status: DC
  Filled 2020-12-14: qty 4

## 2020-12-14 MED ORDER — TRAVASOL 10 % IV SOLN
INTRAVENOUS | Status: AC
  Filled 2020-12-14: qty 1020

## 2020-12-14 MED ORDER — TBO-FILGRASTIM 480 MCG/0.8ML ~~LOC~~ SOSY
480.0000 ug | PREFILLED_SYRINGE | Freq: Every day | SUBCUTANEOUS | Status: DC
  Administered 2020-12-15 – 2020-12-17 (×3): 480 ug via SUBCUTANEOUS
  Filled 2020-12-14 (×7): qty 0.8

## 2020-12-14 MED ORDER — HYDROMORPHONE HCL 1 MG/ML IJ SOLN
0.5000 mg | Freq: Once | INTRAMUSCULAR | Status: AC
Start: 2020-12-14 — End: 2020-12-14
  Administered 2020-12-14: 0.5 mg via INTRAVENOUS
  Filled 2020-12-14: qty 0.5

## 2020-12-14 MED ORDER — LABETALOL HCL 5 MG/ML IV SOLN
10.0000 mg | Freq: Once | INTRAVENOUS | Status: AC
  Administered 2020-12-14: 10 mg via INTRAVENOUS

## 2020-12-14 NOTE — Progress Notes (Signed)
PT Cancellation Note  Patient Details Name: Marcus [name] MRN: [id number] DOB: [birth year]   Cancelled Treatment:    Reason Eval/Treat Not Completed: Other (comment).  Chart reviewed.  Nurse cleared pt for therapy participation.  Per surgery note from today "okay to ambulate with PT".  After therapist introduced herself to pt, pt immediately stating he was not going to do anything today.  When therapist asked pt why, pt stated that the doctor told him not to do anything today.  Therapist attempted to get more information from pt regarding which doctor and why but pt unable to expand on this information and persistent that he was not going to do any therapy today.  Nurse notified.  Will re-attempt PT treatment session at a later date/time.  Leitha Bleak, PT 12/14/20, 12:04 PM

## 2020-12-14 NOTE — Progress Notes (Signed)
Marcus [name]   DOB:Jul 02, 1960   [id number]    Subjective: No acute events overnight.  Continues to have abdominal pain not any worse.  Objective:  Vitals:   12/14/20 1716 12/14/20 2057  BP: (!) 141/87 94/63  Pulse: 82 81  Resp: 20 20  Temp: 98.8 F (37.1 C) 97.6 F (36.4 C)  SpO2: 100% 99%     Intake/Output Summary (Last 24 hours) at 12/14/2020 2202 Last data filed at 12/14/2020 2149 Gross per 24 hour  Intake 2418.55 ml  Output 365 ml  Net 2053.55 ml    Physical Exam Vitals and nursing note reviewed.  HENT:     Head: Normocephalic and atraumatic.     Mouth/Throat:     Pharynx: Oropharynx is clear.  Eyes:     Extraocular Movements: Extraocular movements intact.     Pupils: Pupils are equal, round, and reactive to light.  Cardiovascular:     Rate and Rhythm: Normal rate and regular rhythm.  Pulmonary:     Comments: Decreased breath sounds bilaterally.  Abdominal:     Palpations: Abdomen is soft.     Comments: Abdominal binder in place.  Musculoskeletal:        General: Normal range of motion.     Cervical back: Normal range of motion.  Skin:    General: Skin is warm.  Neurological:     General: No focal deficit present.     Mental Status: He is alert and oriented to person, place, and time.  Psychiatric:        Behavior: Behavior normal.        Judgment: Judgment normal.     Labs:  Lab Results  Component Value Date   WBC 1.3 (LL) 12/14/2020   HGB 10.0 (L) 12/14/2020   HCT 29.3 (L) 12/14/2020   MCV 82.5 12/14/2020   PLT 36 (L) 12/14/2020   NEUTROABS 0.3 (LL) 12/14/2020    Lab Results  Component Value Date   NA 129 (L) 12/14/2020   K 4.2 12/14/2020   CL 97 (L) 12/14/2020   CO2 27 12/14/2020    Studies:  DG Chest 1 View  Result Date: 12/14/2020 CLINICAL DATA:  60 year old male with shortness of breath. Recent gastric perforation, sleeve gastrectomy. EXAM: CHEST  1 VIEW COMPARISON:  CT Chest, Abdomen, and Pelvis 12/03/2020 and earlier. FINDINGS:  Portable AP upright view at 0558 hours. Emphysema demonstrated on the CT last month. Lower lung volumes today, lung markings appear stable. No pneumothorax, pleural effusion or acute pulmonary opacity. Right PICC line in place, tip at the cavoatrial junction level. Enteric tube courses to the abdomen with side hole at the level of the gastric body. Surgical drain redemonstrated in the left upper quadrant. Visible bowel-gas pattern within normal limits. No acute osseous abnormality identified. Chronic right sixth and 7th rib fractures. IMPRESSION: 1. Chronic lung disease.  No acute cardiopulmonary abnormality. 2. Right PICC line, enteric tube, and left upper quadrant drain in place with no adverse features. Electronically Signed   By: Genevie Ann M.D.   On: 12/14/2020 06:27   DG UGI W SINGLE CM (SOL OR THIN BA)  Result Date: 12/14/2020 CLINICAL DATA:  Concern for gastric perforation, status post EXAM: WATER SOLUBLE UPPER GI SERIES TECHNIQUE: Single-column upper GI series was performed using water soluble contrast. CONTRAST:  Dilute Gastrografin enteric contrast COMPARISON:  CT abdomen pelvis, 12/10/2020 FLUOROSCOPY TIME:  Fluoroscopy Time:  00:48 Number of Acquired Spot Images: 4 FINDINGS: Following gentle syringe injection through nasogastric tube,  there is gross contrast leak into an extraluminal collection adjacent to the gastric fundus, underlying the left hemidiaphragm, in keeping with findings of prior CT. There is a surgical drain in this vicinity, tip projecting within this collection. The distal stomach and proximal small bowel are normal in single contrast appearance. Following the procedure, residual contrast material was gently aspirated from the nasogastric tube. IMPRESSION: Gross contrast leak into an extraluminal collection adjacent to the gastric fundus, underlying the left hemidiaphragm, in keeping with appearance and configuration seen on prior CT. There is a surgical drain in this vicinity, tip  projecting within this collection. Electronically Signed   By: Eddie Candle M.D.   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  Currently off Granix.  ANC is 300.  Reinstitute Granix daily.  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  Platelets in the 30s-slight improvement.  S/p surgery I think patient's coagulopathy should improve/DIC should improve.  Recheck basic labs in the morning.  Transfuse cryoprecipitate if less than 100.  Stable.  #Gastric perforation s/p surgery-posterior collection- s/p robot assisted partial sleeve gastrectomy; Incisional hernia repair/ cholecystectomy-on antibiotics per ID stable.    Cammie Sickle, MD 12/14/2020

## 2020-12-14 NOTE — Progress Notes (Signed)
The patient was seen and examined.  His temperature was 100.4 and after Tylenol it came down to 99.4.  Respiratory rate has been ranging from 26-28 up from 18 earlier yesterday.  Blood pressure was 154/99 and after being given 10 mg IV labetalol that I ordered his blood pressure was 123/70 and later 92/54 with a MAP of 66.  His pulse oximetry was 90% on 2 L of O2 by nasal cannula.  He was given 0.5 mg of IV Dilaudid for pain.  He was thought to have coarse breath sounds and a stat portable chest x-ray was ordered and came back with no significant changes from previous chest x-ray.  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    On clinical examination: The patient was awake and alert and was in mild distress from abdominal pain.  HEENT: Atraumatic/normocephalic with PERRLA, positive scleral icterus.  NG tube in place with dark NG aspirate.  OP slightly dry mucous membranes and tongue.  Cardiovascular with regular rate rhythm normal S1-S2 with no murmurs gallops or rubs.  Lungs slightly diminished bibasilar breath sounds with coarse breath sounds.  Abdomen soft with mild generalized tenderness without rebound tenderness guarding or rigidity.  Extremities with no no edema clubbing or cyanosis.  We will give 250 mill IV normal saline bolus to improve blood pressure and consider more pain therapy that hopefully will calm his respiratory rate and stabilize his heart rate.  We will continue monitoring him.

## 2020-12-14 NOTE — Progress Notes (Signed)
PHARMACY - TOTAL PARENTERAL NUTRITION CONSULT NOTE   Indication: perforated gastric ulcer  Patient Measurements: Height: '5\' 10"'$  (177.8 cm) Weight: 61.3 kg (135 lb 2.3 oz) IBW/kg (Calculated) : 73 TPN AdjBW (KG): 59.3 Body mass index is 19.39 kg/m.  Assessment:  admitted multiple times recently with hemoptysis and gastric bleeding s/p EGD and clipping in fundus, with cavitary pneumonia, with seizures.  Has history of drug use and was cocaine positive on one of his admissions.  Has also hx of Felty syndrome with neutropenia.  He has surgical history of splenectomy in 2017 and exploratory laparotomy with Phillip Heal patch repair of a pyloric channel perforated ulcer in 2021  Glucose / Insulin: on dexamethasone BG 120 - 148 no hx DM, 2 units SSI required Electrolytes: WNL Renal: Scr < 1, stable Hepatic: ASt/ALT 76/23  09/07; TG 191-->418-->151 Intake / Output; MIVF: no MIVF (+) 8.49 L GI Imaging:perforated gastric ulcer GI Surgeries / Procedures: s/p robotic assisted repair of gastric perforation  ABX: meropenem (ID following)  Central access: 12/07/20 TPN start date: 12/07/20   Nutritional Goals: Goal TPN rate is 85 mL/hr (provides 102 g of protein and 2060 kcals per day)  RD Assessment: Estimated Needs Total Energy Estimated Needs: 1900-2200kcal/day Total Protein Estimated Needs: 95-110g/day Total Fluid Estimated Needs: 1.8-2.1L/day  Current Nutrition:  NPO  Plan:  Continue TPN at goal rate: 85 mL/hr at 1800 (total volume including overfill: 2140 mL)  Nutritional components: Amino acids (Travasol 10%): 102 grams Lipids (20% SMOF): 49 grams Dextrose: 306 grams kCal: 1938 / 24h Electrolytes in TPN: Na 100 mEq/L (increased 9/9), K 22mq/L, Ca 577m/L, Mg 13m62mL, and Phos 113m19mL. Cl:Ac 1:1.   Continue Sensitive q6h SSI and adjust as needed. On oral prednisone, no hx DM   Monitor TPN labs on Mon/Thurs  RodnDallie PilesarmD, BCPS 12/14/2020,7:12 AM

## 2020-12-14 NOTE — Progress Notes (Signed)
[place] Hospital Day(s): 11.   Post op day(s): 3 Days Post-Op.   Interval History:  Patient seen and examined Events overnight noted: Fever to 100.4 resolved with tylenol, CXR appeared stable, EKG reviewed, HTN corrected with labetalol This morning, patient is feeling better; anxious to eat Incisional soreness No nausea, emesis, chills Renal function remains noral; sCr - <0.30; UO - 1.3L Hyponatremia to 129, o/w no significant electrolyte derangements NGT remains in good position; output 200 ccs Surgical drain with 140 ccs out; sanguinous but thin Continues NPO and on TPN  Vital signs in last 24 hours: [min-max] current  Temp:  [97.6 F (36.4 C)-100.4 F (38 C)] 98.1 F (36.7 C) (09/09 0711) Pulse Rate:  [73-120] 95 (09/09 0711) Resp:  [18-28] 20 (09/09 0711) BP: (92-154)/(54-99) 119/73 (09/09 0711) SpO2:  [93 %-100 %] 96 % (09/09 0711)     Height: '5\' 10"'$  (177.8 cm) Weight: 61.3 kg BMI (Calculated): 19.39   Intake/Output last 2 shifts:  09/08 0701 - 09/09 0700 In: 2469.2 [I.V.:1817.9; IV Piggyback:651.3] Out: 1640 [Urine:1300; Emesis/NG output:200; Drains:140]   Physical Exam:  Constitutional: alert, cooperative and no distress  HEENT: NGT in place; good positioning Respiratory: breathing non-labored at rest  Cardiovascular: regular rate and sinus rhythm  Gastrointestinal: Soft, incisional soreness, non-distended, no rebound/guarding. Surgical drain in place Integumentary: Laparoscopic incisions are CDI with dermabond, no erythema or drainage     Labs:  CBC Latest Ref Rng & Units 12/13/2020 12/12/2020 12/11/2020  WBC 4.0 - 10.5 K/uL 3.1(L) 9.9 12.2(H)  Hemoglobin 13.0 - 17.0 g/dL 9.4(L) 9.9(L) 11.2(L)  Hematocrit 39.0 - 52.0 % 28.2(L) 29.0(L) 33.4(L)  Platelets 150 - 400 K/uL 35(L) 23(LL) 27(LL)   CMP Latest Ref Rng & Units 12/14/2020 12/13/2020 12/12/2020  Glucose 70 - 99 mg/dL 123(H) 146(H) 274(H)  BUN 6 - 20 mg/dL 24(H) 29(H)  35(H)  Creatinine 0.61 - 1.24 mg/dL <0.30(L) <0.30(L) 0.43(L)  Sodium 135 - 145 mmol/L 129(L) 134(L) 132(L)  Potassium 3.5 - 5.1 mmol/L 4.2 3.9 4.6  Chloride 98 - 111 mmol/L 97(L) 101 100  CO2 22 - 32 mmol/L '27 28 27  '$ Calcium 8.9 - 10.3 mg/dL 8.0(L) 7.7(L) 7.7(L)  Total Protein 6.5 - 8.1 g/dL - 4.9(L) 4.9(L)  Total Bilirubin 0.3 - 1.2 mg/dL - 2.3(H) 3.2(H)  Alkaline Phos 38 - 126 U/L - 221(H) 177(H)  AST 15 - 41 U/L - 89(H) 76(H)  ALT 0 - 44 U/L - 39 23     Imaging studies:   CXR (12/14/2020) personally reviewed without acute cardiopulmonary findings, and radiologist report reviewed below:  IMPRESSION: 1. Chronic lung disease.  No acute cardiopulmonary abnormality. 2. Right PICC line, enteric tube, and left upper quadrant drain in place with no adverse features.   Assessment/Plan:  60 y.o. male 3 Days Post-Op s/p robotic assisted laparoscopic partial sleeve gastrectomy, cholecystectomy, and incisional hernia repair for proximal posterior gastric perforation with abscess and cholecystitis   - Will get UGI series to reassess gastric perforation repair. Pending this, we amy be able to remove NGT and initiate diet  - Continue TPN at goal rate             - Continue NGT decompression for now; monitor and record output              - Continue IV Abx (meropenem); ID on board  - Monitor abdominal examination; on-going bowel function - Pain control prn; antiemetics prn - Okay to ambulate with PT;  recommending SNF             - Appreciate hematology/oncology input  - Appreciate hospitalist assistance overnight  All of the above findings and recommendations were discussed with the patient, and the medical team, and all of patient's questions were answered to his expressed satisfaction.  -- Marcus Simon, PA-C [facility] 12/14/2020, 7:28 AM [phone] M-F: 7am - 4pm

## 2020-12-14 NOTE — Progress Notes (Signed)
Pt resp elevated and fever of 100.4 with labored breathing. Lungs a little junky. Mews score of 4. Notified charge nurse on 2c and icu.notified dr.mansy as well., tylenol given, lasix and labetolol. Ekg and chest xray completed. Dr Sidney Ace and charge from icu at bedside. Pt mews now a 2. Temp came down and hr as well. Resp still elevated. Cont to monitor

## 2020-12-14 NOTE — Progress Notes (Signed)
Date of Admission:  12/02/2020    ID: Marcus [name] is a 60 y.o. male  Principal Problem:   Gastric perforation ([place]) Active Problems:   Felty syndrome (H[facility])   Intra-abdominal abscess (H[facility])   Thrombocytopenia ([facility])   Neutropenia ([place])   Abdominal aortic aneurysm (AAA) ([place])   Sepsis ([place])   Calculus of gallbladder without cholecystitis without obstruction   Incisional hernia, without obstruction or gangrene    Subjective: Pt is sleeping Underwent contrast thru  NG and there is leak NG tube has coffee ground Medications:   Chlorhexidine Gluconate Cloth  6 each Topical Daily   insulin aspart  0-9 Units Subcutaneous Q6H   mometasone-formoterol  2 puff Inhalation BID   multivitamin with minerals  1 tablet Oral Daily   multivitamin-lutein  1 capsule Per Tube Daily   nicotine  21 mg Transdermal Daily   pantoprazole (PROTONIX) IV  40 mg Intravenous BID   predniSONE  10 mg Oral BID   sodium chloride flush  10-40 mL Intracatheter Q12H   vitamin A  20,000 Units Per NG tube Daily    Objective: Vital signs in last 24 hours: Temp:  [97.6 F (36.4 C)-100.4 F (38 C)] 97.8 F (36.6 C) (09/09 1249) Pulse Rate:  [73-120] 79 (09/09 1249) Resp:  [18-28] 18 (09/09 1249) BP: (92-154)/(54-99) 146/87 (09/09 1249) SpO2:  [95 %-100 %] 100 % (09/09 1249)  PHYSICAL EXAM:  General: somnolent, no distress,  Lungs: Clear to auscultation bilaterally. No Wheezing or Rhonchi. No rales. Heart: Regular rate and rhythm, no murmur, rub or gallop. Abdomen: Soft, left upper quadrant drain Binder not removed Extremities: atraumatic, no cyanosis. No edema. No clubbing Skin: No rashes or lesions. Or bruising Lymph: Cervical, supraclavicular normal. Neurologic: Grossly non-focal  Lab Results Recent Labs    12/13/20 0525 12/13/20 0703 12/14/20 0525 12/14/20 0807  WBC  --  3.1*  --  1.3*  HGB  --  9.4*  --  10.0*  HCT  --  28.2*  --  29.3*  NA 134*  --  129*  --   K 3.9  --  4.2  --   CL 101   --  97*  --   CO2 28  --  27  --   BUN 29*  --  24*  --   CREATININE <0.30*  --  <0.30*  --    Liver Panel Recent Labs    12/12/20 0328 12/13/20 0525  PROT 4.9* 4.9*  ALBUMIN 2.2* 2.1*  AST 76* 89*  ALT 23 39  ALKPHOS 177* 221*  BILITOT 3.2* 2.3*    Microbiology: 12/02/20 BC- NG Studies/Results: DG Chest 1 View  Result Date: 12/14/2020 CLINICAL DATA:  60 year old male with shortness of breath. Recent gastric perforation, sleeve gastrectomy. EXAM: CHEST  1 VIEW COMPARISON:  CT Chest, Abdomen, and Pelvis 12/03/2020 and earlier. FINDINGS: Portable AP upright view at 0558 hours. Emphysema demonstrated on the CT last month. Lower lung volumes today, lung markings appear stable. No pneumothorax, pleural effusion or acute pulmonary opacity. Right PICC line in place, tip at the cavoatrial junction level. Enteric tube courses to the abdomen with side hole at the level of the gastric body. Surgical drain redemonstrated in the left upper quadrant. Visible bowel-gas pattern within normal limits. No acute osseous abnormality identified. Chronic right sixth and 7th rib fractures. IMPRESSION: 1. Chronic lung disease.  No acute cardiopulmonary abnormality. 2. Right PICC line, enteric tube, and left upper quadrant drain in place with no  adverse features. Electronically Signed   By: Genevie Ann M.D.   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

## 2020-12-14 NOTE — Progress Notes (Signed)
Patient ID: Marcus [name], male   DOB: 11-Jun-1960, 60 y.o.   MRN: [id number]  PROGRESS NOTE    Marcus [name]  [id number] DOB: [birth year] DOA: 12/02/2020 PCP: [name], FNP   Brief Narrative:  60 y.o. male with history of COPD, rheumatoid arthritis, Felty syndrome, chronic respiratory failure on 2 L of oxygen, tobacco use, history of MRSA pneumonia presented secondary to shortness of breath and was found to have gastric perforation with concern for intra-abdominal abscess.  Patient was started on empiric antibiotics and general surgery was consulted.  He was treated with conservative management.  TPN was started for nutrition.  During hospitalization, he developed worsening neutropenia in the setting of underlying Felty syndrome requiring Granix by hematology/oncology.  He also developed significant thrombocytopenia without bleeding.  HIT panel was negative.  Repeat CT of abdomen/pelvis with persistent perforation/fluid collection.  He underwent surgical intervention on 12/11/2020.  Assessment & Plan:   Sepsis: Present on admission -Cultures negative so far.  Currently hemodynamically stable.  Perforated gastric ulcer with intra-abdominal abscess -Initially treated with conservatively with IV antibiotics and TPN. -Repeat CT of abdomen and pelvis on 12/10/2020 showed evidence of persistent perforation in addition to fluid collection. -Status post surgical intervention on 12/11/2020.  Currently has NG tube.  Currently n.p.o.; diet advancement as per general surgery.  Wound care as per general surgery. -Continue IV meropenem.  ID following.  Felty syndrome Neutropenia Rheumatoid arthritis Status post splenectomy -Heme-onc on board.  Patient has received Granix for neutropenia.  Granix has been stopped.  Mildly leukopenic on 12/13/2020.  Acute thrombocytopenia -Questionable cause.  HIT testing negative. Lovenox discontinued. -Has received platelet transfusion during this hospitalization.   Platelets 35 on 12/13/2020.  Platelets pending today.  Received a unit of platelets again on 12/12/2020.  Elevated LFTs with hyperbilirubinemia -Concerning for biliary pathology.  CT abdomen/pelvis without evidence of biliary disease.  Improving slowly.  Monitor intermittently  COPD -Stable.  Continue current inhaler regimen.  Abdominal aortic aneurysm 3.2 x 3.0 cm.  Recommendation for follow-up ultrasound every 3 years   DVT prophylaxis: SCDs Code Status: Full Family Communication: None at bedside Disposition Plan: Status is: Inpatient  Remains inpatient appropriate because:Inpatient level of care appropriate due to severity of illness  Dispo: The patient is from: Home              Anticipated d/c is to: Home              Patient currently is not medically stable to d/c.   Difficult to place patient No  Consultants: General surgery; hematology/oncology; ID  Procedures: Robotic assisted partial sleeve gastrectomy/cholecystectomy/incisional hernia repair on 12/11/2020  Antimicrobials:  Anti-infectives (From admission, onward)    Start     Dose/Rate Route Frequency Ordered Stop   12/08/20 1400  meropenem (MERREM) 1 g in sodium chloride 0.9 % 100 mL IVPB        1 g 200 mL/hr over 30 Minutes Intravenous Every 8 hours 12/07/20 1611     12/07/20 1600  ertapenem (INVANZ) 1,000 mg in sodium chloride 0.9 % 100 mL IVPB  Status:  Discontinued       Note to Pharmacy: Thrombocytopenia possibly due to cefepime, hematology suggested to change antibiotics   1 g 200 mL/hr over 30 Minutes Intravenous Every 24 hours 12/07/20 0836 12/07/20 1611   12/03/20 1400  ceFEPIme (MAXIPIME) 2 g in sodium chloride 0.9 % 100 mL IVPB  Status:  Discontinued  2 g 200 mL/hr over 30 Minutes Intravenous Every 8 hours 12/03/20 0637 12/07/20 0836   12/03/20 0545  ceFEPIme (MAXIPIME) 2 g in sodium chloride 0.9 % 100 mL IVPB        2 g 200 mL/hr over 30 Minutes Intravenous  Once 12/03/20 0532 12/03/20 0638    12/03/20 0545  metroNIDAZOLE (FLAGYL) IVPB 500 mg  Status:  Discontinued        500 mg 100 mL/hr over 60 Minutes Intravenous Every 12 hours 12/03/20 0532 12/07/20 1534   12/03/20 0330  piperacillin-tazobactam (ZOSYN) IVPB 3.375 g        3.375 g 100 mL/hr over 30 Minutes Intravenous  Once 12/03/20 0324 12/03/20 0401        Subjective: Patient seen and examined at bedside.  Poor historian.  No fever, vomiting reported.  Complains of intermittent abdominal pain.   Objective: Vitals:   12/14/20 0547 12/14/20 0559 12/14/20 0622 12/14/20 0711  BP: 123/70 (!) 92/54 120/73 119/73  Pulse: 96 93 100 95  Resp: (!) 26 (!) 28 (!) 26 20  Temp: 99.4 F (37.4 C)   98.1 F (36.7 C)  TempSrc:      SpO2: 98% 98% 95% 96%  Weight:      Height:        Intake/Output Summary (Last 24 hours) at 12/14/2020 0748 Last data filed at 12/14/2020 0629 Gross per 24 hour  Intake 2469.2 ml  Output 1640 ml  Net 829.2 ml    Filed Weights   12/11/20 0502 12/12/20 0442 12/13/20 0447  Weight: 59.6 kg 62.6 kg 61.3 kg    Examination:  General exam: No distress.  Still requiring 2 to 3 L of oxygen via nasal cannula.  Looks chronically ill and deconditioned. ENT: NG tube present Respiratory system: Bilateral decreased breath sounds at bases with some crackles; intermittent tachypnea cardiovascular system: S1-S2 heard; rate controlled  gastrointestinal system: Abdomen is distended mildly, dressing present with surrounding tenderness.  Bowel sounds are sluggish extremities: No cyanosis; lower extremity edema present bilaterally Central nervous system: Slow to respond; alert.  Poor historian.  No focal neurological deficits.  Moving extremities  skin: No obvious ecchymosis/other rashes psychiatry: Hardly participates in any conversation.  Flat affect.  Data Reviewed: I have personally reviewed following labs and imaging studies  CBC: Recent Labs  Lab 12/09/20 0435 12/10/20 0443 12/11/20 0644 12/11/20 1304  12/11/20 1840 12/12/20 0328 12/13/20 0703  WBC 1.3* 4.1 11.9*  --  12.2* 9.9 3.1*  NEUTROABS 0.5* 2.3 10.8*  --   --  8.9* 2.3  HGB 13.9 15.2 12.7*  --  11.2* 9.9* 9.4*  HCT 44.0 44.9 37.7*  --  33.4* 29.0* 28.2*  MCV 95.2 81.2 82.0  --  82.9 84.3 82.9  PLT 56* 20* 21* 39* 27* 23* 35*    Basic Metabolic Panel: Recent Labs  Lab 12/08/20 0530 12/09/20 0700 12/10/20 0443 12/10/20 0813 12/11/20 0644 12/12/20 0328 12/13/20 0525 12/14/20 0525  NA 142 137 132*  --  131* 132* 134* 129*  K 4.7 4.8 6.1* 5.2* 4.5 4.6 3.9 4.2  CL 111 103 99  --  100 100 101 97*  CO2 '27 28 28  '$ --  '27 27 28 27  '$ GLUCOSE 130* 139* 86  --  147* 274* 146* 123*  BUN 17 18 24*  --  24* 35* 29* 24*  CREATININE 0.54* 0.41* <0.30*  --  <0.30* 0.43* <0.30* <0.30*  CALCIUM 8.1* 8.2* 8.2*  --  7.9* 7.7*  7.7* 8.0*  MG 2.3 2.4 2.3  --   --   --  2.3  --   PHOS 4.5 3.8 2.3*  --  3.8  --  2.9  --     GFR: CrCl cannot be calculated (This lab value cannot be used to calculate CrCl because it is not a number: <0.30). Liver Function Tests: Recent Labs  Lab 12/10/20 0443 12/11/20 0644 12/12/20 0328 12/13/20 0525  AST 55* 35 76* 89*  ALT '18 12 23 '$ 39  ALKPHOS 311* 214* 177* 221*  BILITOT 4.2* 2.7* 3.2* 2.3*  PROT 5.6* 5.1* 4.9* 4.9*  ALBUMIN 2.3* 2.0* 2.2* 2.1*    No results for input(s): LIPASE, AMYLASE in the last 168 hours. No results for input(s): AMMONIA in the last 168 hours. Coagulation Profile: Recent Labs  Lab 12/07/20 1020 12/11/20 1304 12/11/20 1840 12/12/20 0328 12/14/20 0525  INR 1.7* 1.0 1.1 1.1 1.0    Cardiac Enzymes: No results for input(s): CKTOTAL, CKMB, CKMBINDEX, TROPONINI in the last 168 hours. BNP (last 3 results) No results for input(s): PROBNP in the last 8760 hours. HbA1C: No results for input(s): HGBA1C in the last 72 hours. CBG: Recent Labs  Lab 12/13/20 1221 12/13/20 1818 12/13/20 2313 12/13/20 2349 12/14/20 0506  GLUCAP 148* 120* 118* 126* 128*    Lipid  Profile: Recent Labs    12/13/20 0525  TRIG 151*    Thyroid Function Tests: No results for input(s): TSH, T4TOTAL, FREET4, T3FREE, THYROIDAB in the last 72 hours. Anemia Panel: No results for input(s): VITAMINB12, FOLATE, FERRITIN, TIBC, IRON, RETICCTPCT in the last 72 hours. Sepsis Labs: No results for input(s): PROCALCITON, LATICACIDVEN in the last 168 hours.  Recent Results (from the past 240 hour(s))  MRSA Next Gen by PCR, Nasal     Status: None   Collection Time: 12/10/20  9:20 PM   Specimen: Nasal Mucosa; Nasal Swab  Result Value Ref Range Status   MRSA by PCR Next Gen NOT DETECTED NOT DETECTED Final    Comment: (NOTE) The GeneXpert MRSA Assay (FDA approved for NASAL specimens only), is one component of a comprehensive MRSA colonization surveillance program. It is not intended to diagnose MRSA infection nor to guide or monitor treatment for MRSA infections. Test performance is not FDA approved in patients less than 70 years old. Performed at [facility], [address] [place], [place] [address]           Radiology Studies: DG Chest 1 View  Result Date: 12/14/2020 CLINICAL DATA:  60 year old male with shortness of breath. Recent gastric perforation, sleeve gastrectomy. EXAM: CHEST  1 VIEW COMPARISON:  CT Chest, Abdomen, and Pelvis 12/03/2020 and earlier. FINDINGS: Portable AP upright view at 0558 hours. Emphysema demonstrated on the CT last month. Lower lung volumes today, lung markings appear stable. No pneumothorax, pleural effusion or acute pulmonary opacity. Right PICC line in place, tip at the cavoatrial junction level. Enteric tube courses to the abdomen with side hole at the level of the gastric body. Surgical drain redemonstrated in the left upper quadrant. Visible bowel-gas pattern within normal limits. No acute osseous abnormality identified. Chronic right sixth and 7th rib fractures. IMPRESSION: 1. Chronic lung disease.  No acute cardiopulmonary  abnormality. 2. Right PICC line, enteric tube, and left upper quadrant drain in place with no adverse features. Electronically Signed   By: Genevie Ann M.D.   On: 12/14/2020 06:27   DG Abd Portable 1V  Result Date: 12/12/2020 CLINICAL DATA:  NG tube  advancement EXAM: PORTABLE ABDOMEN - 1 VIEW COMPARISON:  12/12/2020 at 0132 hours FINDINGS: Limited radiograph of the lower chest and upper abdomen was obtained for the purposes of enteric tube localization. Enteric tube is seen coursing below the diaphragm with distal tip and side port terminating within the expected location of the gastric body. Surgical drain in the left upper quadrant. IMPRESSION: Enteric tube terminates within the gastric body. Electronically Signed   By: Davina Poke D.O.   On: 12/12/2020 11:27        Scheduled Meds:  Chlorhexidine Gluconate Cloth  6 each Topical Daily   insulin aspart  0-9 Units Subcutaneous Q6H   mometasone-formoterol  2 puff Inhalation BID   multivitamin with minerals  1 tablet Oral Daily   multivitamin-lutein  1 capsule Per Tube Daily   nicotine  21 mg Transdermal Daily   pantoprazole (PROTONIX) IV  40 mg Intravenous BID   predniSONE  10 mg Oral BID   sodium chloride flush  10-40 mL Intracatheter Q12H   vitamin A  20,000 Units Per NG tube Daily   Continuous Infusions:  meropenem (MERREM) IV Stopped (12/14/20 0551)   TPN ADULT (ION) 85 mL/hr at 12/14/20 [id number]          Aline August, MD [facility] 12/14/2020, 7:48 AM

## 2020-12-15 DIAGNOSIS — D709 Neutropenia, unspecified: Secondary | ICD-10-CM | POA: Diagnosis not present

## 2020-12-15 DIAGNOSIS — D708 Other neutropenia: Secondary | ICD-10-CM

## 2020-12-15 DIAGNOSIS — M0500 Felty's syndrome, unspecified site: Secondary | ICD-10-CM | POA: Diagnosis not present

## 2020-12-15 DIAGNOSIS — D65 Disseminated intravascular coagulation [defibrination syndrome]: Secondary | ICD-10-CM

## 2020-12-15 DIAGNOSIS — K255 Chronic or unspecified gastric ulcer with perforation: Secondary | ICD-10-CM | POA: Diagnosis not present

## 2020-12-15 LAB — CBC WITH DIFFERENTIAL/PLATELET
Abs Immature Granulocytes: 0.01 10*3/uL (ref 0.00–0.07)
Abs Immature Granulocytes: 0.01 10*3/uL (ref 0.00–0.07)
Basophils Absolute: 0 10*3/uL (ref 0.0–0.1)
Basophils Absolute: 0 10*3/uL (ref 0.0–0.1)
Basophils Relative: 0 %
Basophils Relative: 0 %
Eosinophils Absolute: 0 10*3/uL (ref 0.0–0.5)
Eosinophils Absolute: 0 10*3/uL (ref 0.0–0.5)
Eosinophils Relative: 1 %
Eosinophils Relative: 0 %
HCT: 24.5 % — ABNORMAL LOW (ref 39.0–52.0)
HCT: 26.1 % — ABNORMAL LOW (ref 39.0–52.0)
Hemoglobin: 8.3 g/dL — ABNORMAL LOW (ref 13.0–17.0)
Hemoglobin: 9.1 g/dL — ABNORMAL LOW (ref 13.0–17.0)
Immature Granulocytes: 1 %
Immature Granulocytes: 0 %
Lymphocytes Relative: 15 %
Lymphocytes Relative: 4 %
Lymphs Abs: 0.2 10*3/uL — ABNORMAL LOW (ref 0.7–4.0)
Lymphs Abs: 0.1 10*3/uL — ABNORMAL LOW (ref 0.7–4.0)
MCH: 28.2 pg (ref 26.0–34.0)
MCH: 28.5 pg (ref 26.0–34.0)
MCHC: 33.9 g/dL (ref 30.0–36.0)
MCHC: 34.9 g/dL (ref 30.0–36.0)
MCV: 83.3 fL (ref 80.0–100.0)
MCV: 81.8 fL (ref 80.0–100.0)
Monocytes Absolute: 0.5 10*3/uL (ref 0.1–1.0)
Monocytes Absolute: 0.5 10*3/uL (ref 0.1–1.0)
Monocytes Relative: 33 %
Monocytes Relative: 16 %
Neutro Abs: 0.8 10*3/uL — ABNORMAL LOW (ref 1.7–7.7)
Neutro Abs: 2.4 10*3/uL (ref 1.7–7.7)
Neutrophils Relative %: 50 %
Neutrophils Relative %: 80 %
Platelets: 42 10*3/uL — ABNORMAL LOW (ref 150–400)
Platelets: 65 10*3/uL — ABNORMAL LOW (ref 150–400)
RBC: 2.94 MIL/uL — ABNORMAL LOW (ref 4.22–5.81)
RBC: 3.19 MIL/uL — ABNORMAL LOW (ref 4.22–5.81)
RDW: 21 % — ABNORMAL HIGH (ref 11.5–15.5)
RDW: 20.6 % — ABNORMAL HIGH (ref 11.5–15.5)
Smear Review: NORMAL
WBC: 1.6 10*3/uL — ABNORMAL LOW (ref 4.0–10.5)
WBC: 3 10*3/uL — ABNORMAL LOW (ref 4.0–10.5)
nRBC: 2.6 % — ABNORMAL HIGH (ref 0.0–0.2)
nRBC: 1.4 % — ABNORMAL HIGH (ref 0.0–0.2)

## 2020-12-15 LAB — GLUCOSE, CAPILLARY
Glucose-Capillary: 119 mg/dL — ABNORMAL HIGH (ref 70–99)
Glucose-Capillary: 127 mg/dL — ABNORMAL HIGH (ref 70–99)
Glucose-Capillary: 146 mg/dL — ABNORMAL HIGH (ref 70–99)
Glucose-Capillary: 155 mg/dL — ABNORMAL HIGH (ref 70–99)
Glucose-Capillary: 157 mg/dL — ABNORMAL HIGH (ref 70–99)

## 2020-12-15 LAB — RETIC PANEL
Immature Retic Fract: 15.9 % (ref 2.3–15.9)
RBC.: 2.96 MIL/uL — ABNORMAL LOW (ref 4.22–5.81)
Retic Count, Absolute: 86.1 10*3/uL (ref 19.0–186.0)
Retic Ct Pct: 2.9 % (ref 0.4–3.1)
Reticulocyte Hemoglobin: 29.3 pg (ref >27.9)

## 2020-12-15 LAB — BASIC METABOLIC PANEL
Anion gap: 0 — ABNORMAL LOW (ref 5–15)
BUN: 23 mg/dL — ABNORMAL HIGH (ref 6–20)
CO2: 34 mmol/L — ABNORMAL HIGH (ref 22–32)
Calcium: 7.7 mg/dL — ABNORMAL LOW (ref 8.9–10.3)
Chloride: 98 mmol/L (ref 98–111)
Creatinine, Ser: <0.3 mg/dL — ABNORMAL LOW (ref 0.61–1.24)
Glucose, Bld: 117 mg/dL — ABNORMAL HIGH (ref 70–99)
Potassium: 3.5 mmol/L (ref 3.5–5.1)
Sodium: 132 mmol/L — ABNORMAL LOW (ref 135–145)

## 2020-12-15 LAB — HEPATIC FUNCTION PANEL
ALT: 46 U/L — ABNORMAL HIGH (ref 0–44)
AST: 58 U/L — ABNORMAL HIGH (ref 15–41)
Albumin: 1.8 g/dL — ABNORMAL LOW (ref 3.5–5.0)
Alkaline Phosphatase: 294 U/L — ABNORMAL HIGH (ref 38–126)
Bilirubin, Direct: 0.9 mg/dL — ABNORMAL HIGH (ref 0.0–0.2)
Indirect Bilirubin: 0.8 mg/dL (ref 0.3–0.9)
Total Bilirubin: 1.7 mg/dL — ABNORMAL HIGH (ref 0.3–1.2)
Total Protein: 4.7 g/dL — ABNORMAL LOW (ref 6.5–8.1)

## 2020-12-15 LAB — MAGNESIUM: Magnesium: 2.2 mg/dL (ref 1.7–2.4)

## 2020-12-15 LAB — FIBRINOGEN: Fibrinogen: 486 mg/dL — ABNORMAL HIGH (ref 210–475)

## 2020-12-15 LAB — PROTIME-INR
INR: 1 (ref 0.8–1.2)
Prothrombin Time: 13.3 seconds (ref 11.4–15.2)

## 2020-12-15 LAB — APTT: aPTT: 42 seconds — ABNORMAL HIGH (ref 24–36)

## 2020-12-15 MED ORDER — METHYLPREDNISOLONE SODIUM SUCC 40 MG IJ SOLR
20.0000 mg | Freq: Every day | INTRAMUSCULAR | Status: DC
  Administered 2020-12-15 – 2020-12-18 (×4): 20 mg via INTRAVENOUS
  Filled 2020-12-15 (×4): qty 1

## 2020-12-15 MED ORDER — SODIUM CHLORIDE 0.9% IV SOLUTION: Freq: Once | INTRAVENOUS | Status: AC

## 2020-12-15 MED ORDER — TRAVASOL 10 % IV SOLN
INTRAVENOUS | Status: AC
  Filled 2020-12-15: qty 1020

## 2020-12-15 NOTE — Progress Notes (Signed)
Marcus [name]   DOB:1960-11-05   [id number]    Subjective: Continues to have abdominal pain, no acute overnight events.  Objective:  Vitals:   12/15/20 1522 12/15/20 1921  BP: 128/72 115/72  Pulse: (!) 105 83  Resp: 19 20  Temp: 100 F (37.8 C) 97.6 F (36.4 C)  SpO2: 95% 99%     Intake/Output Summary (Last 24 hours) at 12/15/2020 2013 Last data filed at 12/15/2020 1855 Gross per 24 hour  Intake 1353.85 ml  Output 2230 ml  Net -876.15 ml     Physical Exam Vitals and nursing note reviewed.  HENT:     Head: Normocephalic and atraumatic.     Mouth/Throat:     Pharynx: Oropharynx is clear.  Eyes:     Extraocular Movements: Extraocular movements intact.     Pupils: Pupils are equal, round, and reactive to light.  Cardiovascular:     Rate and Rhythm: Normal rate and regular rhythm.  Pulmonary:     Comments: Decreased breath sounds bilaterally.  Abdominal:     Palpations: Abdomen is soft.     Tenderness: There is abdominal tenderness.     Comments: Abdominal binder in place.  Dark blood in drainage  Musculoskeletal:        General: Normal range of motion.     Cervical back: Normal range of motion.  Skin:    General: Skin is warm.  Neurological:     Mental Status: He is alert. Mental status is at baseline.     Labs:  Lab Results  Component Value Date   WBC 3.0 (L) 12/15/2020   HGB 9.1 (L) 12/15/2020   HCT 26.1 (L) 12/15/2020   MCV 81.8 12/15/2020   PLT 65 (L) 12/15/2020   NEUTROABS 2.4 12/15/2020    Lab Results  Component Value Date   NA 132 (L) 12/15/2020   K 3.5 12/15/2020   CL 98 12/15/2020   CO2 34 (H) 12/15/2020    Studies:  DG Chest 1 View  Result Date: 12/14/2020 CLINICAL DATA:  60 year old male with shortness of breath. Recent gastric perforation, sleeve gastrectomy. EXAM: CHEST  1 VIEW COMPARISON:  CT Chest, Abdomen, and Pelvis 12/03/2020 and earlier. FINDINGS: Portable AP upright view at 0558 hours. Emphysema demonstrated on the CT last month.  Lower lung volumes today, lung markings appear stable. No pneumothorax, pleural effusion or acute pulmonary opacity. Right PICC line in place, tip at the cavoatrial junction level. Enteric tube courses to the abdomen with side hole at the level of the gastric body. Surgical drain redemonstrated in the left upper quadrant. Visible bowel-gas pattern within normal limits. No acute osseous abnormality identified. Chronic right sixth and 7th rib fractures. IMPRESSION: 1. Chronic lung disease.  No acute cardiopulmonary abnormality. 2. Right PICC line, enteric tube, and left upper quadrant drain in place with no adverse features. Electronically Signed   By: Genevie Ann M.D.   On: 12/14/2020 06:27   DG UGI W SINGLE CM (SOL OR THIN BA)  Result Date: 12/14/2020 CLINICAL DATA:  Concern for gastric perforation, status post EXAM: WATER SOLUBLE UPPER GI SERIES TECHNIQUE: Single-column upper GI series was performed using water soluble contrast. CONTRAST:  Dilute Gastrografin enteric contrast COMPARISON:  CT abdomen pelvis, 12/10/2020 FLUOROSCOPY TIME:  Fluoroscopy Time:  00:48 Number of Acquired Spot Images: 4 FINDINGS: Following gentle syringe injection through nasogastric tube, there is gross contrast leak into an extraluminal collection adjacent to the gastric fundus, underlying the left hemidiaphragm, in keeping with findings of  prior CT. There is a surgical drain in this vicinity, tip projecting within this collection. The distal stomach and proximal small bowel are normal in single contrast appearance. Following the procedure, residual contrast material was gently aspirated from the nasogastric tube. IMPRESSION: Gross contrast leak into an extraluminal collection adjacent to the gastric fundus, underlying the left hemidiaphragm, in keeping with appearance and configuration seen on prior CT. There is a surgical drain in this vicinity, tip projecting within this collection. Electronically Signed   By: Eddie Candle M.D.   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  800 today. 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   Fibrinogen level has been monitored daily.  12/15/2020 fibrinogen 486.  Increased. Platelet count is overall increasing, 65,000 today. PT is normal.  Slightly increased APTT. Discussed with surgery Dr. Lysle Pearl who feels there is concern of continued bleeding at the perforation site, hematoma at the site of epigastric port site.  Given that fibrinogen level is adequate, I will hold off cryoprecipitate infusion. Recommend 1 unit of platelet transfusion.   #Gastric perforation s/p surgery-posterior collection- s/p robot assisted partial sleeve gastrectomy; Incisional hernia repair/ cholecystectomy-on Unasyn per ID stable.   Earlie Server, MD 12/15/2020

## 2020-12-15 NOTE — Progress Notes (Signed)
PHARMACY - TOTAL PARENTERAL NUTRITION CONSULT NOTE   Indication: perforated gastric ulcer  Patient Measurements: Height: '5\' 10"'$  (177.8 cm) Weight: 59.2 kg (130 lb 8.2 oz) IBW/kg (Calculated) : 73 TPN AdjBW (KG): 59.3 Body mass index is 18.73 kg/m.  Assessment:  admitted multiple times recently with hemoptysis and gastric bleeding s/p EGD and clipping in fundus, with cavitary pneumonia, with seizures.  Has history of drug use and was cocaine positive on one of his admissions.  Has also hx of Felty syndrome with neutropenia.  He has surgical history of splenectomy in 2017 and exploratory laparotomy with Phillip Heal patch repair of a pyloric channel perforated ulcer in 2021  Glucose / Insulin: on prednisone 10 bid BG 117-162 no hx DM,  4 units SSI /24 hrs  Electrolytes: Na 132 Renal: Scr < 1, stable Hepatic: ASt/ALT 76/23  09/07; TG 191-->418-->151 Intake / Output; MIVF: no MIVF (+) 8.49 L GI Imaging:perforated gastric ulcer GI Surgeries / Procedures: s/p robotic assisted repair of gastric perforation  ABX: meropenem (ID following)  Central access: 12/07/20 TPN start date: 12/07/20   Nutritional Goals: Goal TPN rate is 85 mL/hr (provides 102 g of protein and 2060 kcals per day)  RD Assessment: Estimated Needs Total Energy Estimated Needs: 1900-2200kcal/day Total Protein Estimated Needs: 95-110g/day Total Fluid Estimated Needs: 1.8-2.1L/day  Current Nutrition:  NPO  Plan:  Continue TPN at goal rate: 85 mL/hr at 1800 (total volume including overfill: 2140 mL)  Nutritional components: Amino acids (Travasol 10%): 102 grams Lipids (20% SMOF): 49 grams Dextrose: 306 grams kCal: 1938 / 24h Electrolytes in TPN: Na 100 mEq/L (increased from 86mq to 100 meq on 9/9), K 0 mEq/L, Ca 5413m/L, Mg 13m58mL, and Phos 113m27mL. Cl:Ac 1:1.   Continue Sensitive q6h SSI and adjust as needed. On oral prednisone, no hx DM   Monitor TPN labs on Mon/Thurs  Dehaven Sine A, PharmD 12/15/2020,9:38  AM

## 2020-12-15 NOTE — Progress Notes (Signed)
Patient ID: Marcus BROUILLET, male   DOB: 07-Apr-1961, 60 y.o.   MRN: [id number]  PROGRESS NOTE    Marcus [name]  [id number] DOB: [birth year] DOA: 12/02/2020 PCP: [name], FNP   Brief Narrative:  60 y.o. male with history of COPD, rheumatoid arthritis, Felty syndrome, chronic respiratory failure on 2 L of oxygen, tobacco use, history of MRSA pneumonia presented secondary to shortness of breath and was found to have gastric perforation with concern for intra-abdominal abscess.  Patient was started on empiric antibiotics and general surgery was consulted.  He was treated with conservative management.  TPN was started for nutrition.  During hospitalization, he developed worsening neutropenia in the setting of underlying Felty syndrome requiring Granix by hematology/oncology.  He also developed significant thrombocytopenia without bleeding.  HIT panel was negative.  Repeat CT of abdomen/pelvis with persistent perforation/fluid collection.  He underwent surgical intervention on 12/11/2020.  Assessment & Plan:   Sepsis: Present on admission -Cultures negative so far.  Currently hemodynamically stable.  Perforated gastric ulcer with intra-abdominal abscess -Initially treated with conservatively with IV antibiotics and TPN. -Repeat CT of abdomen and pelvis on 12/10/2020 showed evidence of persistent perforation in addition to fluid collection. -Status post surgical intervention on 12/11/2020.  Currently has NG tube.  Currently n.p.o.; diet advancement as per general surgery.  Wound care as per general surgery. -Continue IV meropenem.  ID following.  Felty syndrome Neutropenia Rheumatoid arthritis Status post splenectomy -Heme-onc on board.   -Granix has been resumed by oncology again on 12/14/2020.   Acute thrombocytopenia -Questionable cause.  HIT testing negative. Lovenox discontinued. -Has received platelet transfusion during this hospitalization.  Platelets 42 today.  Received a unit of platelets  again on 12/12/2020.  Elevated LFTs with hyperbilirubinemia -Concerning for biliary pathology.  CT abdomen/pelvis without evidence of biliary disease.  Improving slowly.  Monitor intermittently  COPD -Stable.  Continue current inhaler regimen.  Abdominal aortic aneurysm 3.2 x 3.0 cm.  Recommendation for follow-up ultrasound every 3 years   DVT prophylaxis: SCDs Code Status: Full Family Communication: None at bedside Disposition Plan: Status is: Inpatient  Remains inpatient appropriate because:Inpatient level of care appropriate due to severity of illness  Dispo: The patient is from: Home              Anticipated d/c is to: Home              Patient currently is not medically stable to d/c.   Difficult to place patient No  Consultants: General surgery; hematology/oncology; ID  Procedures: Robotic assisted partial sleeve gastrectomy/cholecystectomy/incisional hernia repair on 12/11/2020  Antimicrobials:  Anti-infectives (From admission, onward)    Start     Dose/Rate Route Frequency Ordered Stop   12/08/20 1400  meropenem (MERREM) 1 g in sodium chloride 0.9 % 100 mL IVPB        1 g 200 mL/hr over 30 Minutes Intravenous Every 8 hours 12/07/20 1611     12/07/20 1600  ertapenem (INVANZ) 1,000 mg in sodium chloride 0.9 % 100 mL IVPB  Status:  Discontinued       Note to Pharmacy: Thrombocytopenia possibly due to cefepime, hematology suggested to change antibiotics   1 g 200 mL/hr over 30 Minutes Intravenous Every 24 hours 12/07/20 0836 12/07/20 1611   12/03/20 1400  ceFEPIme (MAXIPIME) 2 g in sodium chloride 0.9 % 100 mL IVPB  Status:  Discontinued        2 g 200 mL/hr over 30 Minutes Intravenous Every 8  hours 12/03/20 0637 12/07/20 0836   12/03/20 0545  ceFEPIme (MAXIPIME) 2 g in sodium chloride 0.9 % 100 mL IVPB        2 g 200 mL/hr over 30 Minutes Intravenous  Once 12/03/20 0532 12/03/20 0638   12/03/20 0545  metroNIDAZOLE (FLAGYL) IVPB 500 mg  Status:  Discontinued        500  mg 100 mL/hr over 60 Minutes Intravenous Every 12 hours 12/03/20 0532 12/07/20 1534   12/03/20 0330  piperacillin-tazobactam (ZOSYN) IVPB 3.375 g        3.375 g 100 mL/hr over 30 Minutes Intravenous  Once 12/03/20 0324 12/03/20 0401        Subjective: Patient seen and examined at bedside.  Poor historian.  No overnight vomiting, fever, chest pain reported.   Objective: Vitals:   12/14/20 1716 12/14/20 2057 12/15/20 0025 12/15/20 0516  BP: (!) 141/87 94/63 122/72 107/67  Pulse: 82 81 74 81  Resp: '20 20 20 20  '$ Temp: 98.8 F (37.1 C) 97.6 F (36.4 C) 98 F (36.7 C) 97.8 F (36.6 C)  TempSrc:  Oral Axillary Oral  SpO2: 100% 99% 100% 100%  Weight:    59.2 kg  Height:        Intake/Output Summary (Last 24 hours) at 12/15/2020 0759 Last data filed at 12/15/2020 0500 Gross per 24 hour  Intake 926.33 ml  Output 1235 ml  Net -308.67 ml    Filed Weights   12/13/20 0447 12/14/20 1138 12/15/20 0516  Weight: 61.3 kg 61.3 kg 59.2 kg    Examination:  General exam: On 3 L oxygen via nasal.  No acute distress.  Looks chronically ill and deconditioned. ENT: Still has NG tube  respiratory system: Decreased breath sounds at bases bilaterally with scattered crackles  cardiovascular system: Rate controlled; S1-S2 heard gastrointestinal system: Abdomen is slightly distended, mildly tender diffusely.  Sluggish bowel sounds extremities: Bilateral lower extremity edema present; no clubbing  Central nervous system: Awake; extremely slow to respond.  Poor historian.  No focal neurological deficits.  Moves extremities skin: No obvious petechiae/lesions  psychiatry: Extremely flat affect.  Does not participate in conversation much.    Data Reviewed: I have personally reviewed following labs and imaging studies  CBC: Recent Labs  Lab 12/11/20 0644 12/11/20 1304 12/11/20 1840 12/12/20 0328 12/13/20 0703 12/14/20 0807 12/15/20 0420  WBC 11.9*  --  12.2* 9.9 3.1* 1.3* 1.6*  NEUTROABS  10.8*  --   --  8.9* 2.3 0.3* 0.8*  HGB 12.7*  --  11.2* 9.9* 9.4* 10.0* 8.3*  HCT 37.7*  --  33.4* 29.0* 28.2* 29.3* 24.5*  MCV 82.0  --  82.9 84.3 82.9 82.5 83.3  PLT 21*   < > 27* 23* 35* 36* 42*   < > = values in this interval not displayed.    Basic Metabolic Panel: Recent Labs  Lab 12/09/20 0700 12/10/20 0443 12/10/20 0813 12/11/20 [id number] 12/12/20 0328 12/13/20 0525 12/14/20 0525 12/15/20 0420  NA 137 132*  --  131* 132* 134* 129* 132*  K 4.8 6.1*   < > 4.5 4.6 3.9 4.2 3.5  CL 103 99  --  100 100 101 97* 98  CO2 28 28  --  '27 27 28 27 '$ 34*  GLUCOSE 139* 86  --  147* 274* 146* 123* 117*  BUN 18 24*  --  24* 35* 29* 24* 23*  CREATININE 0.41* <0.30*  --  <0.30* 0.43* <0.30* <0.30* <0.30*  CALCIUM 8.2* 8.2*  --  7.9* 7.7* 7.7* 8.0* 7.7*  MG 2.4 2.3  --   --   --  2.3  --  2.2  PHOS 3.8 2.3*  --  3.8  --  2.9  --   --    < > = values in this interval not displayed.    GFR: CrCl cannot be calculated (This lab value cannot be used to calculate CrCl because it is not a number: <0.30). Liver Function Tests: Recent Labs  Lab 12/10/20 0443 12/11/20 0644 12/12/20 0328 12/13/20 0525  AST 55* 35 76* 89*  ALT '18 12 23 '$ 39  ALKPHOS 311* 214* 177* 221*  BILITOT 4.2* 2.7* 3.2* 2.3*  PROT 5.6* 5.1* 4.9* 4.9*  ALBUMIN 2.3* 2.0* 2.2* 2.1*    No results for input(s): LIPASE, AMYLASE in the last 168 hours. No results for input(s): AMMONIA in the last 168 hours. Coagulation Profile: Recent Labs  Lab 12/11/20 1304 12/11/20 1840 12/12/20 0328 12/14/20 0525 12/15/20 0420  INR 1.0 1.1 1.1 1.0 1.0    Cardiac Enzymes: No results for input(s): CKTOTAL, CKMB, CKMBINDEX, TROPONINI in the last 168 hours. BNP (last 3 results) No results for input(s): PROBNP in the last 8760 hours. HbA1C: No results for input(s): HGBA1C in the last 72 hours. CBG: Recent Labs  Lab 12/14/20 0506 12/14/20 1135 12/14/20 1714 12/15/20 0022 12/15/20 0514  GLUCAP 128* 162* 162* 146* 127*     Lipid Profile: Recent Labs    12/13/20 0525  TRIG 151*    Thyroid Function Tests: No results for input(s): TSH, T4TOTAL, FREET4, T3FREE, THYROIDAB in the last 72 hours. Anemia Panel: No results for input(s): VITAMINB12, FOLATE, FERRITIN, TIBC, IRON, RETICCTPCT in the last 72 hours. Sepsis Labs: No results for input(s): PROCALCITON, LATICACIDVEN in the last 168 hours.  Recent Results (from the past 240 hour(s))  MRSA Next Gen by PCR, Nasal     Status: None   Collection Time: 12/10/20  9:20 PM   Specimen: Nasal Mucosa; Nasal Swab  Result Value Ref Range Status   MRSA by PCR Next Gen NOT DETECTED NOT DETECTED Final    Comment: (NOTE) The GeneXpert MRSA Assay (FDA approved for NASAL specimens only), is one component of a comprehensive MRSA colonization surveillance program. It is not intended to diagnose MRSA infection nor to guide or monitor treatment for MRSA infections. Test performance is not FDA approved in patients less than 61 years old. Performed at [facility], [address], [place], [place] [address]           Radiology Studies: DG Chest 1 View  Result Date: 12/14/2020 CLINICAL DATA:  60 year old male with shortness of breath. Recent gastric perforation, sleeve gastrectomy. EXAM: CHEST  1 VIEW COMPARISON:  CT Chest, Abdomen, and Pelvis 12/03/2020 and earlier. FINDINGS: Portable AP upright view at 0558 hours. Emphysema demonstrated on the CT last month. Lower lung volumes today, lung markings appear stable. No pneumothorax, pleural effusion or acute pulmonary opacity. Right PICC line in place, tip at the cavoatrial junction level. Enteric tube courses to the abdomen with side hole at the level of the gastric body. Surgical drain redemonstrated in the left upper quadrant. Visible bowel-gas pattern within normal limits. No acute osseous abnormality identified. Chronic right sixth and 7th rib fractures. IMPRESSION: 1. Chronic lung disease.  No acute  cardiopulmonary abnormality. 2. Right PICC line, enteric tube, and left upper quadrant drain in place with no adverse features. Electronically Signed   By: Genevie Ann M.D.   On: 12/14/2020  06:27   DG UGI W SINGLE CM (SOL OR THIN BA)  Result Date: 12/14/2020 CLINICAL DATA:  Concern for gastric perforation, status post EXAM: WATER SOLUBLE UPPER GI SERIES TECHNIQUE: Single-column upper GI series was performed using water soluble contrast. CONTRAST:  Dilute Gastrografin enteric contrast COMPARISON:  CT abdomen pelvis, 12/10/2020 FLUOROSCOPY TIME:  Fluoroscopy Time:  00:48 Number of Acquired Spot Images: 4 FINDINGS: Following gentle syringe injection through nasogastric tube, there is gross contrast leak into an extraluminal collection adjacent to the gastric fundus, underlying the left hemidiaphragm, in keeping with findings of prior CT. There is a surgical drain in this vicinity, tip projecting within this collection. The distal stomach and proximal small bowel are normal in single contrast appearance. Following the procedure, residual contrast material was gently aspirated from the nasogastric tube. IMPRESSION: Gross contrast leak into an extraluminal collection adjacent to the gastric fundus, underlying the left hemidiaphragm, in keeping with appearance and configuration seen on prior CT. There is a surgical drain in this vicinity, tip projecting within this collection. Electronically Signed   By: Eddie Candle M.D.   On: 12/14/2020 14:35        Scheduled Meds:  Chlorhexidine Gluconate Cloth  6 each Topical Daily   insulin aspart  0-9 Units Subcutaneous Q6H   mometasone-formoterol  2 puff Inhalation BID   multivitamin with minerals  1 tablet Oral Daily   multivitamin-lutein  1 capsule Per Tube Daily   nicotine  21 mg Transdermal Daily   pantoprazole (PROTONIX) IV  40 mg Intravenous BID   predniSONE  10 mg Oral BID   sodium chloride flush  10-40 mL Intracatheter Q12H   Tbo-filgastrim (GRANIX) SQ  480  mcg Subcutaneous Daily   vitamin A  20,000 Units Per NG tube Daily   Continuous Infusions:  meropenem (MERREM) IV 1 g (12/15/20 0532)   TPN ADULT (ION) 85 mL/hr at 12/14/20 1738          Roberta Kelly Starla Link, MD [facility] 12/15/2020, 7:59 AM

## 2020-12-15 NOTE — Progress Notes (Addendum)
Subjective:  CC: Marcus [name] is a 60 y.o. male  Hospital stay day 12, 4 Days Post-Op s/p robotic assisted laparoscopic partial sleeve gastrectomy, cholecystectomy, and incisional hernia repair for proximal posterior gastric perforation with abscess and cholecystitis  HPI: No acute issues reported overnight.  Pt complains of abdominal pain again, but specifically states he does not believe there is any significant change over the past 24hrs.  ROS:  General: Denies weight loss, weight gain, fatigue, fevers, chills, and night sweats. Heart: Denies chest pain, palpitations, racing heart, irregular heartbeat, leg pain or swelling, and decreased activity tolerance. Respiratory: Denies breathing difficulty, shortness of breath, wheezing, cough, and sputum. GI: Denies change in appetite, heartburn, nausea, vomiting, constipation, diarrhea, and blood in stool. GU: Denies difficulty urinating, pain with urinating, urgency, frequency, blood in urine.   Objective:   Temp:  [97.6 F (36.4 C)-98.8 F (37.1 C)] 97.6 F (36.4 C) (09/10 0847) Pulse Rate:  [74-82] 79 (09/10 0847) Resp:  [18-20] 18 (09/10 0847) BP: (94-146)/(63-87) 139/77 (09/10 0847) SpO2:  [99 %-100 %] 100 % (09/10 0847) Weight:  [59.2 kg-61.3 kg] 59.2 kg (09/10 0516)     Height: '5\' 10"'$  (177.8 cm) Weight: 59.2 kg BMI (Calculated): 18.73   Intake/Output this shift:   Intake/Output Summary (Last 24 hours) at 12/15/2020 0913 Last data filed at 12/15/2020 0500 Gross per 24 hour  Intake 926.33 ml  Output 1135 ml  Net -208.67 ml    Constitutional :  alert, cooperative, appears stated age, and no distress  Respiratory:  clear to auscultation bilaterally  Cardiovascular:  regular rate and rhythm  Gastrointestinal: Soft, no guarding, but has TTP in all four quadrants . NG and drain with continued dark bloody output.  Skin: Cool and moist. Incisions mostly clean except for epigastric port site, with evidence of swelling and skin  discoloration concerning for post op hematoma.  Psychiatric: Normal affect, non-agitated, not confused       LABS:  CMP Latest Ref Rng & Units 12/15/2020 12/14/2020 12/13/2020  Glucose 70 - 99 mg/dL 117(H) 123(H) 146(H)  BUN 6 - 20 mg/dL 23(H) 24(H) 29(H)  Creatinine 0.61 - 1.24 mg/dL <0.30(L) <0.30(L) <0.30(L)  Sodium 135 - 145 mmol/L 132(L) 129(L) 134(L)  Potassium 3.5 - 5.1 mmol/L 3.5 4.2 3.9  Chloride 98 - 111 mmol/L 98 97(L) 101  CO2 22 - 32 mmol/L 34(H) 27 28  Calcium 8.9 - 10.3 mg/dL 7.7(L) 8.0(L) 7.7(L)  Total Protein 6.5 - 8.1 g/dL - - 4.9(L)  Total Bilirubin 0.3 - 1.2 mg/dL - - 2.3(H)  Alkaline Phos 38 - 126 U/L - - 221(H)  AST 15 - 41 U/L - - 89(H)  ALT 0 - 44 U/L - - 39   CBC Latest Ref Rng & Units 12/15/2020 12/14/2020 12/13/2020  WBC 4.0 - 10.5 K/uL 1.6(L) 1.3(LL) 3.1(L)  Hemoglobin 13.0 - 17.0 g/dL 8.3(L) 10.0(L) 9.4(L)  Hematocrit 39.0 - 52.0 % 24.5(L) 29.3(L) 28.2(L)  Platelets 150 - 400 K/uL 42(L) 36(L) 35(L)    RADS: N/a Assessment:   s/p robotic assisted laparoscopic partial sleeve gastrectomy, cholecystectomy, and incisional hernia repair for proximal posterior gastric perforation with abscess and cholecystitis.  Exam concerning for continued bleeding at perforation site, with drop in hgb this am.  Drain and NG otherwise working well.  Will transfuse plts and place him on strict NPO to minimize any stress that maybe contributing to bleeding.  Repeat CBC in pm. Discussed with hematologist and they are in agreement

## 2020-12-16 LAB — BASIC METABOLIC PANEL
Anion gap: 3 — ABNORMAL LOW (ref 5–15)
BUN: 22 mg/dL — ABNORMAL HIGH (ref 6–20)
CO2: 31 mmol/L (ref 22–32)
Calcium: 7.7 mg/dL — ABNORMAL LOW (ref 8.9–10.3)
Chloride: 103 mmol/L (ref 98–111)
Creatinine, Ser: <0.3 mg/dL — ABNORMAL LOW (ref 0.61–1.24)
Glucose, Bld: 145 mg/dL — ABNORMAL HIGH (ref 70–99)
Potassium: 3.4 mmol/L — ABNORMAL LOW (ref 3.5–5.1)
Sodium: 137 mmol/L (ref 135–145)

## 2020-12-16 LAB — CBC WITH DIFFERENTIAL/PLATELET
Abs Immature Granulocytes: 0.07 10*3/uL (ref 0.00–0.07)
Basophils Absolute: 0 10*3/uL (ref 0.0–0.1)
Basophils Relative: 1 %
Eosinophils Absolute: 0 10*3/uL (ref 0.0–0.5)
Eosinophils Relative: 0 %
HCT: 26.2 % — ABNORMAL LOW (ref 39.0–52.0)
Hemoglobin: 8.8 g/dL — ABNORMAL LOW (ref 13.0–17.0)
Immature Granulocytes: 1 %
Lymphocytes Relative: 4 %
Lymphs Abs: 0.3 10*3/uL — ABNORMAL LOW (ref 0.7–4.0)
MCH: 28.3 pg (ref 26.0–34.0)
MCHC: 33.6 g/dL (ref 30.0–36.0)
MCV: 84.2 fL (ref 80.0–100.0)
Monocytes Absolute: 0.6 10*3/uL (ref 0.1–1.0)
Monocytes Relative: 10 %
Neutro Abs: 5.1 10*3/uL (ref 1.7–7.7)
Neutrophils Relative %: 84 %
Platelets: 61 10*3/uL — ABNORMAL LOW (ref 150–400)
RBC: 3.11 MIL/uL — ABNORMAL LOW (ref 4.22–5.81)
RDW: 21.4 % — ABNORMAL HIGH (ref 11.5–15.5)
WBC: 6 10*3/uL (ref 4.0–10.5)
nRBC: 0.5 % — ABNORMAL HIGH (ref 0.0–0.2)

## 2020-12-16 LAB — APTT: aPTT: 40 seconds — ABNORMAL HIGH (ref 24–36)

## 2020-12-16 LAB — FIBRINOGEN: Fibrinogen: 468 mg/dL (ref 210–475)

## 2020-12-16 LAB — GLUCOSE, CAPILLARY
Glucose-Capillary: 132 mg/dL — ABNORMAL HIGH (ref 70–99)
Glucose-Capillary: 168 mg/dL — ABNORMAL HIGH (ref 70–99)

## 2020-12-16 LAB — BPAM PLATELET PHERESIS
Blood Product Expiration Date: 202209112359
ISSUE DATE / TIME: 202209101227
Unit Type and Rh: 8400

## 2020-12-16 LAB — PREPARE PLATELET PHERESIS: Unit division: 0

## 2020-12-16 LAB — HEMOGLOBIN AND HEMATOCRIT, BLOOD
HCT: 26.8 % — ABNORMAL LOW (ref 39.0–52.0)
Hemoglobin: 9 g/dL — ABNORMAL LOW (ref 13.0–17.0)

## 2020-12-16 LAB — MAGNESIUM: Magnesium: 2.3 mg/dL (ref 1.7–2.4)

## 2020-12-16 LAB — PROTIME-INR
INR: 0.9 (ref 0.8–1.2)
Prothrombin Time: 12.6 seconds (ref 11.4–15.2)

## 2020-12-16 MED ORDER — TRAVASOL 10 % IV SOLN
INTRAVENOUS | Status: AC
  Filled 2020-12-16: qty 1020

## 2020-12-16 MED ORDER — POTASSIUM CHLORIDE 10 MEQ/50ML IV SOLN
10.0000 meq | INTRAVENOUS | Status: AC
  Administered 2020-12-16 (×3): 10 meq via INTRAVENOUS
  Filled 2020-12-16 (×3): qty 50

## 2020-12-16 NOTE — Plan of Care (Signed)
  Problem: Clinical Measurements: Goal: Ability to maintain clinical measurements within normal limits will improve Outcome: Progressing Goal: Will remain free from infection Outcome: Progressing Goal: Diagnostic test results will improve Outcome: Progressing Goal: Respiratory complications will improve Outcome: Progressing Goal: Cardiovascular complication will be avoided Outcome: Progressing   Problem: Pain Managment: Goal: General experience of comfort will improve Outcome: Progressing   Pt is involved in and agrees with the plan of care. V/S stable. Complained of surgical pain; Diluadid IV given. JP in place; dressing changed as it leaks. NGT to low intermittent suction with 50 ml dark green output. Lap sites; clean and intact. Pt has nonproductive cough; encouraged to use IS. Refused breathing tx.

## 2020-12-16 NOTE — Progress Notes (Addendum)
Patient ID: Marcus [name], male   DOB: Jan 05, 1961, 60 y.o.   MRN: [id number]  PROGRESS NOTE    Marcus [name]  [id number] DOB: 1961/04/05 DOA: 12/02/2020 PCP: [name], FNP   Brief Narrative:  60 y.o. male with history of COPD, rheumatoid arthritis, Felty syndrome, chronic respiratory failure on 2 L of oxygen, tobacco use, history of MRSA pneumonia presented secondary to shortness of breath and was found to have gastric perforation with concern for intra-abdominal abscess.  Patient was started on empiric antibiotics and general surgery was consulted.  He was treated with conservative management.  TPN was started for nutrition.  During hospitalization, he developed worsening neutropenia in the setting of underlying Felty syndrome requiring Granix by hematology/oncology.  He also developed significant thrombocytopenia without bleeding.  HIT panel was negative.  Repeat CT of abdomen/pelvis with persistent perforation/fluid collection.  He underwent surgical intervention on 12/11/2020.  Assessment & Plan:   Sepsis: Present on admission -Cultures negative so far.  Currently hemodynamically stable. -T-max of 100 over the last 24 hours.  Perforated gastric ulcer with intra-abdominal abscess -Initially treated with conservatively with IV antibiotics and TPN. -Repeat CT of abdomen and pelvis on 12/10/2020 showed evidence of persistent perforation in addition to fluid collection. -Status post surgical intervention on 12/11/2020.  Currently has NG tube.  Currently n.p.o.; diet advancement as per general surgery.  Wound care as per general surgery. -Continue IV meropenem.  ID following.  Felty syndrome Neutropenia Rheumatoid arthritis Status post splenectomy -Heme-onc on board.   -Granix has been resumed by oncology again on 12/14/2020.  -Patient has been on chronic prednisone.  Switched to prednisone to IV Solu-Medrol on 12/15/2020.  Acute thrombocytopenia -Questionable cause.  HIT testing negative.  Lovenox discontinued. -Has received multiple units of platelet transfusions during this hospitalization.  Platelets 61 today.  Received a unit of platelets again on 12/15/2020.  Oncology following.  Elevated LFTs with hyperbilirubinemia -Concerning for biliary pathology.  CT abdomen/pelvis without evidence of biliary disease.  Improving slowly.  Monitor intermittently  COPD -Stable.  Continue current inhaler regimen.  Abdominal aortic aneurysm 3.2 x 3.0 cm.  Recommendation for follow-up ultrasound every 3 years   DVT prophylaxis: SCDs Code Status: Full Family Communication: None at bedside Disposition Plan: Status is: Inpatient  Remains inpatient appropriate because:Inpatient level of care appropriate due to severity of illness  Dispo: The patient is from: Home              Anticipated d/c is to: Home              Patient currently is not medically stable to d/c.   Difficult to place patient No  Consultants: General surgery; hematology/oncology; ID  Procedures: Robotic assisted partial sleeve gastrectomy/cholecystectomy/incisional hernia repair on 12/11/2020  Antimicrobials:  Anti-infectives (From admission, onward)    Start     Dose/Rate Route Frequency Ordered Stop   12/08/20 1400  meropenem (MERREM) 1 g in sodium chloride 0.9 % 100 mL IVPB        1 g 200 mL/hr over 30 Minutes Intravenous Every 8 hours 12/07/20 1611     12/07/20 1600  ertapenem (INVANZ) 1,000 mg in sodium chloride 0.9 % 100 mL IVPB  Status:  Discontinued       Note to Pharmacy: Thrombocytopenia possibly due to cefepime, hematology suggested to change antibiotics   1 g 200 mL/hr over 30 Minutes Intravenous Every 24 hours 12/07/20 0836 12/07/20 1611   12/03/20 1400  ceFEPIme (MAXIPIME) 2 g  in sodium chloride 0.9 % 100 mL IVPB  Status:  Discontinued        2 g 200 mL/hr over 30 Minutes Intravenous Every 8 hours 12/03/20 0637 12/07/20 0836   12/03/20 0545  ceFEPIme (MAXIPIME) 2 g in sodium chloride 0.9 % 100 mL  IVPB        2 g 200 mL/hr over 30 Minutes Intravenous  Once 12/03/20 0532 12/03/20 0638   12/03/20 0545  metroNIDAZOLE (FLAGYL) IVPB 500 mg  Status:  Discontinued        500 mg 100 mL/hr over 60 Minutes Intravenous Every 12 hours 12/03/20 0532 12/07/20 1534   12/03/20 0330  piperacillin-tazobactam (ZOSYN) IVPB 3.375 g        3.375 g 100 mL/hr over 30 Minutes Intravenous  Once 12/03/20 0324 12/03/20 0401        Subjective: Patient seen and examined at bedside.  Poor historian.  No fever, vomiting, worsening shortness of breath reported.  Complains of intermittent abdominal pain.   Objective: Vitals:   12/15/20 1522 12/15/20 1921 12/16/20 0500 12/16/20 0640  BP: 128/72 115/72  118/75  Pulse: (!) 105 83  69  Resp: '19 20  18  '$ Temp: 100 F (37.8 C) 97.6 F (36.4 C)  97.9 F (36.6 C)  TempSrc: Oral Oral    SpO2: 95% 99%  99%  Weight:   59.1 kg   Height:        Intake/Output Summary (Last 24 hours) at 12/16/2020 0805 Last data filed at 12/16/2020 0635 Gross per 24 hour  Intake 2454.93 ml  Output 2340 ml  Net 114.93 ml    Filed Weights   12/14/20 1138 12/15/20 0516 12/16/20 0500  Weight: 61.3 kg 59.2 kg 59.1 kg    Examination:  General exam: No distress.  Still on 3 L oxygen via nasal cannula.  Looks chronically ill and deconditioned. ENT: NG tube present.   Respiratory system: Bilateral decreased breath sounds at bases with some crackles cardiovascular system: S1-S2 heard, currently rate controlled gastrointestinal system: Abdomen is distended mildly, mildly tender diffusely.  Bowel sounds are sluggish extremities: No cyanosis; mild lower extremity edema present Central nervous system: Very slow to respond to questions.  Poor historian.  No focal neurological deficits.  Moving extremities skin: No obvious ecchymosis/other lesions psychiatry: Affect is very flat.  Hardly participates in any conversation.  Data Reviewed: I have personally reviewed following labs and  imaging studies  CBC: Recent Labs  Lab 12/13/20 0703 12/14/20 0807 12/15/20 0420 12/15/20 1512 12/16/20 0518  WBC 3.1* 1.3* 1.6* 3.0* 6.0  NEUTROABS 2.3 0.3* 0.8* 2.4 5.1  HGB 9.4* 10.0* 8.3* 9.1* 8.8*  HCT 28.2* 29.3* 24.5* 26.1* 26.2*  MCV 82.9 82.5 83.3 81.8 84.2  PLT 35* 36* 42* 65* 61*    Basic Metabolic Panel: Recent Labs  Lab 12/10/20 0443 12/10/20 0813 12/11/20 [id number] 12/12/20 0328 12/13/20 0525 12/14/20 0525 12/15/20 0420 12/16/20 0518  NA 132*  --  131* 132* 134* 129* 132* 137  K 6.1*   < > 4.5 4.6 3.9 4.2 3.5 3.4*  CL 99  --  100 100 101 97* 98 103  CO2 28  --  '27 27 28 27 '$ 34* 31  GLUCOSE 86  --  147* 274* 146* 123* 117* 145*  BUN 24*  --  24* 35* 29* 24* 23* 22*  CREATININE <0.30*  --  <0.30* 0.43* <0.30* <0.30* <0.30* <0.30*  CALCIUM 8.2*  --  7.9* 7.7* 7.7* 8.0* 7.7* 7.7*  MG 2.3  --   --   --  2.3  --  2.2 2.3  PHOS 2.3*  --  3.8  --  2.9  --   --   --    < > = values in this interval not displayed.    GFR: CrCl cannot be calculated (This lab value cannot be used to calculate CrCl because it is not a number: <0.30). Liver Function Tests: Recent Labs  Lab 12/10/20 0443 12/11/20 0644 12/12/20 0328 12/13/20 0525 12/15/20 0420  AST 55* 35 76* 89* 58*  ALT '18 12 23 '$ 39 46*  ALKPHOS 311* 214* 177* 221* 294*  BILITOT 4.2* 2.7* 3.2* 2.3* 1.7*  PROT 5.6* 5.1* 4.9* 4.9* 4.7*  ALBUMIN 2.3* 2.0* 2.2* 2.1* 1.8*    No results for input(s): LIPASE, AMYLASE in the last 168 hours. No results for input(s): AMMONIA in the last 168 hours. Coagulation Profile: Recent Labs  Lab 12/11/20 1840 12/12/20 0328 12/14/20 0525 12/15/20 0420 12/16/20 0518  INR 1.1 1.1 1.0 1.0 0.9    Cardiac Enzymes: No results for input(s): CKTOTAL, CKMB, CKMBINDEX, TROPONINI in the last 168 hours. BNP (last 3 results) No results for input(s): PROBNP in the last 8760 hours. HbA1C: No results for input(s): HGBA1C in the last 72 hours. CBG: Recent Labs  Lab 12/15/20 0514  12/15/20 1138 12/15/20 1659 12/15/20 2332 12/16/20 0513  GLUCAP 127* 119* 155* 157* 132*    Lipid Profile: No results for input(s): CHOL, HDL, LDLCALC, TRIG, CHOLHDL, LDLDIRECT in the last 72 hours.  Thyroid Function Tests: No results for input(s): TSH, T4TOTAL, FREET4, T3FREE, THYROIDAB in the last 72 hours. Anemia Panel: Recent Labs    12/15/20 0420  RETICCTPCT 2.9   Sepsis Labs: No results for input(s): PROCALCITON, LATICACIDVEN in the last 168 hours.  Recent Results (from the past 240 hour(s))  MRSA Next Gen by PCR, Nasal     Status: None   Collection Time: 12/10/20  9:20 PM   Specimen: Nasal Mucosa; Nasal Swab  Result Value Ref Range Status   MRSA by PCR Next Gen NOT DETECTED NOT DETECTED Final    Comment: (NOTE) The GeneXpert MRSA Assay (FDA approved for NASAL specimens only), is one component of a comprehensive MRSA colonization surveillance program. It is not intended to diagnose MRSA infection nor to guide or monitor treatment for MRSA infections. Test performance is not FDA approved in patients less than 41 years old. Performed at [facility], [address] [place], [place] [address]           Radiology Studies: DG UGI W SINGLE CM (SOL OR THIN BA)  Result Date: 12/14/2020 CLINICAL DATA:  Concern for gastric perforation, status post EXAM: WATER SOLUBLE UPPER GI SERIES TECHNIQUE: Single-column upper GI series was performed using water soluble contrast. CONTRAST:  Dilute Gastrografin enteric contrast COMPARISON:  CT abdomen pelvis, 12/10/2020 FLUOROSCOPY TIME:  Fluoroscopy Time:  00:48 Number of Acquired Spot Images: 4 FINDINGS: Following gentle syringe injection through nasogastric tube, there is gross contrast leak into an extraluminal collection adjacent to the gastric fundus, underlying the left hemidiaphragm, in keeping with findings of prior CT. There is a surgical drain in this vicinity, tip projecting within this collection. The distal stomach  and proximal small bowel are normal in single contrast appearance. Following the procedure, residual contrast material was gently aspirated from the nasogastric tube. IMPRESSION: Gross contrast leak into an extraluminal collection adjacent to the gastric fundus, underlying the left hemidiaphragm, in keeping with appearance  and configuration seen on prior CT. There is a surgical drain in this vicinity, tip projecting within this collection. Electronically Signed   By: Eddie Candle M.D.   On: 12/14/2020 14:35        Scheduled Meds:  Chlorhexidine Gluconate Cloth  6 each Topical Daily   insulin aspart  0-9 Units Subcutaneous Q6H   methylPREDNISolone (SOLU-MEDROL) injection  20 mg Intravenous Daily   mometasone-formoterol  2 puff Inhalation BID   nicotine  21 mg Transdermal Daily   pantoprazole (PROTONIX) IV  40 mg Intravenous BID   sodium chloride flush  10-40 mL Intracatheter Q12H   Tbo-filgastrim (GRANIX) SQ  480 mcg Subcutaneous Daily   Continuous Infusions:  meropenem (MERREM) IV 1 g (12/16/20 0511)   potassium chloride     TPN ADULT (ION) 85 mL/hr at 12/16/20 0402          Aline August, MD [facility] 12/16/2020, 8:05 AM

## 2020-12-16 NOTE — Progress Notes (Signed)
PHARMACY - TOTAL PARENTERAL NUTRITION CONSULT NOTE   Indication: perforated gastric ulcer  Patient Measurements: Height: '5\' 10"'$  (177.8 cm) Weight: 59.1 kg (130 lb 4.7 oz) IBW/kg (Calculated) : 73 TPN AdjBW (KG): 59.3 Body mass index is 18.69 kg/m.  Assessment:  admitted multiple times recently with hemoptysis and gastric bleeding s/p EGD and clipping in fundus, with cavitary pneumonia, with seizures.  Has history of drug use and was cocaine positive on one of his admissions.  Has also hx of Felty syndrome with neutropenia.  He has surgical history of splenectomy in 2017 and exploratory laparotomy with Phillip Heal patch repair of a pyloric channel perforated ulcer in 2021  Glucose / Insulin: on prednisone 10 bid>> chg to methylprednisolone 20 mg IV daily on 9/11 BG 119-157 no hx DM,  5 units SSI /24 hrs  Electrolytes: Na 132>137 after 12 hrs of new TPN w/ 100 meq NA, K 3.4 Renal: Scr < 1, stable Hepatic: ASt/ALT 58/46 09/10; TG 191-->418-->151 Intake / Output; MIVF: no MIVF (+) 8.49 L GI Imaging:perforated gastric ulcer GI Surgeries / Procedures: s/p robotic assisted repair of gastric perforation  ABX: meropenem (ID following)  Central access: 12/07/20 TPN start date: 12/07/20   Nutritional Goals: Goal TPN rate is 85 mL/hr (provides 102 g of protein and 2060 kcals per day)  RD Assessment: Estimated Needs Total Energy Estimated Needs: 1900-2200kcal/day Total Protein Estimated Needs: 95-110g/day Total Fluid Estimated Needs: 1.8-2.1L/day  Current Nutrition:  NPO  Plan:  Continue TPN at goal rate: 85 mL/hr at 1800 (total volume including overfill: 2140 mL)  Nutritional components: Amino acids (Travasol 10%): 102 grams Lipids (20% SMOF): 49 grams Dextrose: 306 grams kCal: 1938 / 24h Electrolytes in TPN: Na 90 mEq/L (slight decrease from 12mq to 90 meq on 9/11), K 15 mEq/L(added K back to TPN 9/11), Ca 562m/L, Mg 3m103mL, and Phos 13m41mL. Cl:Ac 1:1.   Na 132>>137 (after ~ 10  hrs of TPN w/ increased Na amount). Will slightly decrease Na from 100 meq to 90 meq/L K 3.4.  Will add K back (at 15 meq/L) in TPN 9/11 Continue Sensitive q6h SSI and adjust as needed. On methylprednisolone IV, no hx DM   Monitor TPN labs on Mon/Thurs  Marcus [name] A, PharmD 12/16/2020,9:09 AM

## 2020-12-16 NOTE — TOC Progression Note (Signed)
Transition of Care [facility]) - Progression Note    Patient Details  Name: Marcus [name] MRN: [id number] Date of Birth: [birth year]  [facility] CM/SW [place], LCSW Phone Number: 12/16/2020, 1:28 PM  Clinical Narrative:   Met with patient to discuss PT recommendations. He is not interested in SNF or home health. CSW asked about assistance at home. He lives with his mother that is in her 49's. He said she is still able to get around well and brother lives down the street so he can provide physical assistance if needed. CSW encouraged him to go ahead and discuss with his mother and brother while still hospitalized.  Expected Discharge Plan: Home/Self Care Barriers to Discharge: Continued Medical Work up  Expected Discharge Plan and Services Expected Discharge Plan: Home/Self Care       Living arrangements for the past 2 months: Single Family Home                                       Social Determinants of Health (SDOH) Interventions    Readmission Risk Interventions Readmission Risk Prevention Plan 12/04/2020 11/02/2020 09/20/2020  Transportation Screening Complete Complete Complete  PCP or Specialist Appt within 3-5 Days - - -  HRI or [place] for [place] - - -  Medication Review Press photographer) Complete Complete Complete  PCP or Specialist appointment within 3-5 days of discharge Complete Complete Complete  HRI or [place] - Complete Complete  SW Recovery Care/Counseling Consult Complete Complete -  Palliative Care Screening Not Applicable Not Applicable Not [place] Not Applicable Not Applicable Not Applicable  Some recent data might be hidden

## 2020-12-16 NOTE — Progress Notes (Signed)
Subjective:  CC: Marcus [name] is a 60 y.o. male  Hospital stay day 13, 5 Days Post-Op s/p robotic assisted laparoscopic partial sleeve gastrectomy, cholecystectomy, and incisional hernia repair for proximal posterior gastric perforation with abscess and cholecystitis  HPI: No acute issues reported overnight.  No change in pain.    ROS:  General: Denies weight loss, weight gain, fatigue, fevers, chills, and night sweats. Heart: Denies chest pain, palpitations, racing heart, irregular heartbeat, leg pain or swelling, and decreased activity tolerance. Respiratory: Denies breathing difficulty, shortness of breath, wheezing, cough, and sputum. GI: Denies change in appetite, heartburn, nausea, vomiting, constipation, diarrhea, and blood in stool. GU: Denies difficulty urinating, pain with urinating, urgency, frequency, blood in urine.   Objective:   Temp:  [97.6 F (36.4 C)-100 F (37.8 C)] 97.9 F (36.6 C) (09/11 0640) Pulse Rate:  [69-108] 69 (09/11 0640) Resp:  [18-20] 18 (09/11 0640) BP: (115-128)/(67-75) 118/75 (09/11 0640) SpO2:  [95 %-99 %] 99 % (09/11 0640) Weight:  [59.1 kg] 59.1 kg (09/11 0500)     Height: '5\' 10"'$  (177.8 cm) Weight: 59.1 kg BMI (Calculated): 18.69   Intake/Output this shift:   Intake/Output Summary (Last 24 hours) at 12/16/2020 1051 Last data filed at 12/16/2020 [id number] Gross per 24 hour  Intake 2454.93 ml  Output 2340 ml  Net 114.93 ml    Constitutional :  alert, cooperative, appears stated age, and no distress  Respiratory:  clear to auscultation bilaterally  Cardiovascular:  regular rate and rhythm  Gastrointestinal: Soft, no guarding, but has TTP in all four quadrants . NG and drain with continued dark bloody output. unchanged  Skin: Cool and moist. Incisions mostly clean except for epigastric port site, with evidence of swelling and skin discoloration concerning for possible post op hematoma, stable compared to last exam  Psychiatric: Normal affect,  non-agitated, not confused       LABS:  CMP Latest Ref Rng & Units 12/16/2020 12/15/2020 12/14/2020  Glucose 70 - 99 mg/dL 145(H) 117(H) 123(H)  BUN 6 - 20 mg/dL 22(H) 23(H) 24(H)  Creatinine 0.61 - 1.24 mg/dL <0.30(L) <0.30(L) <0.30(L)  Sodium 135 - 145 mmol/L 137 132(L) 129(L)  Potassium 3.5 - 5.1 mmol/L 3.4(L) 3.5 4.2  Chloride 98 - 111 mmol/L 103 98 97(L)  CO2 22 - 32 mmol/L 31 34(H) 27  Calcium 8.9 - 10.3 mg/dL 7.7(L) 7.7(L) 8.0(L)  Total Protein 6.5 - 8.1 g/dL - 4.7(L) -  Total Bilirubin 0.3 - 1.2 mg/dL - 1.7(H) -  Alkaline Phos 38 - 126 U/L - 294(H) -  AST 15 - 41 U/L - 58(H) -  ALT 0 - 44 U/L - 46(H) -   CBC Latest Ref Rng & Units 12/16/2020 12/15/2020 12/15/2020  WBC 4.0 - 10.5 K/uL 6.0 3.0(L) 1.6(L)  Hemoglobin 13.0 - 17.0 g/dL 8.8(L) 9.1(L) 8.3(L)  Hematocrit 39.0 - 52.0 % 26.2(L) 26.1(L) 24.5(L)  Platelets 150 - 400 K/uL 61(L) 65(L) 42(L)    RADS: N/a Assessment:   s/p robotic assisted laparoscopic partial sleeve gastrectomy, cholecystectomy, and incisional hernia repair for proximal posterior gastric perforation with abscess and cholecystitis.  Hgb stable and no obvious change in clinical exam today.  CPM.

## 2020-12-16 NOTE — Progress Notes (Signed)
   12/14/20 0511  Assess: MEWS Score  Temp (!) 100.4 F (38 C)  BP (!) 154/99  Pulse Rate (!) 120  Resp (!) 28  SpO2 96 %  O2 Device Nasal Cannula  Assess: MEWS Score  MEWS Temp 0  MEWS Systolic 0  MEWS Pulse 2  MEWS RR 2  MEWS LOC 0  MEWS Score 4  MEWS Score Color Red  Assess: if the MEWS score is Yellow or Red  Were vital signs taken at a resting state? Yes  Focused Assessment Change from prior assessment (see assessment flowsheet)  Does the patient meet 2 or more of the SIRS criteria? Yes  Does the patient have a confirmed or suspected source of infection? Yes  Provider and Rapid Response Notified?  (icu n hospitalist notified)  MEWS guidelines implemented *See Row Information* Yes  Treat  MEWS Interventions Administered scheduled meds/treatments  Take Vital Signs  Increase Vital Sign Frequency  Red: Q 1hr X 4 then Q 4hr X 4, if remains red, continue Q 4hrs  Escalate  MEWS: Escalate Red: discuss with charge nurse/RN and provider, consider discussing with RRT  Notify: Charge Nurse/RN  Name of Charge Nurse/RN Notified Donita Brooks rn  Date Charge Nurse/RN Notified 12/14/20  Time Charge Nurse/RN Notified 0515  Notify: Provider  Provider Name/Title mansy  Date Provider Notified 12/14/20  Time Provider Notified 0522  Notification Type Page  Notification Reason Change in status  Provider response Other (Comment) (waiting response)  Assess: SIRS CRITERIA  SIRS Temperature  0  SIRS Pulse 1  SIRS Respirations  1  SIRS WBC 0  SIRS Score Sum  2  Inserted for Valentina Shaggy RN

## 2020-12-17 DIAGNOSIS — K255 Chronic or unspecified gastric ulcer with perforation: Secondary | ICD-10-CM | POA: Diagnosis not present

## 2020-12-17 LAB — GLUCOSE, CAPILLARY
Glucose-Capillary: 114 mg/dL — ABNORMAL HIGH (ref 70–99)
Glucose-Capillary: 145 mg/dL — ABNORMAL HIGH (ref 70–99)
Glucose-Capillary: 149 mg/dL — ABNORMAL HIGH (ref 70–99)
Glucose-Capillary: 176 mg/dL — ABNORMAL HIGH (ref 70–99)
Glucose-Capillary: 90 mg/dL (ref 70–99)

## 2020-12-17 LAB — CBC WITH DIFFERENTIAL/PLATELET
Abs Immature Granulocytes: 0.14 10*3/uL — ABNORMAL HIGH (ref 0.00–0.07)
Basophils Absolute: 0.1 10*3/uL (ref 0.0–0.1)
Basophils Relative: 1 %
Eosinophils Absolute: 0 10*3/uL (ref 0.0–0.5)
Eosinophils Relative: 0 %
HCT: 26.6 % — ABNORMAL LOW (ref 39.0–52.0)
Hemoglobin: 8.8 g/dL — ABNORMAL LOW (ref 13.0–17.0)
Immature Granulocytes: 1 %
Lymphocytes Relative: 2 %
Lymphs Abs: 0.3 10*3/uL — ABNORMAL LOW (ref 0.7–4.0)
MCH: 28 pg (ref 26.0–34.0)
MCHC: 33.1 g/dL (ref 30.0–36.0)
MCV: 84.7 fL (ref 80.0–100.0)
Monocytes Absolute: 0.6 10*3/uL (ref 0.1–1.0)
Monocytes Relative: 4 %
Neutro Abs: 13.3 10*3/uL — ABNORMAL HIGH (ref 1.7–7.7)
Neutrophils Relative %: 92 %
Platelets: 58 10*3/uL — ABNORMAL LOW (ref 150–400)
RBC: 3.14 MIL/uL — ABNORMAL LOW (ref 4.22–5.81)
RDW: 21.6 % — ABNORMAL HIGH (ref 11.5–15.5)
WBC: 14.5 10*3/uL — ABNORMAL HIGH (ref 4.0–10.5)
nRBC: 0.3 % — ABNORMAL HIGH (ref 0.0–0.2)

## 2020-12-17 LAB — COMPREHENSIVE METABOLIC PANEL
ALT: 57 U/L — ABNORMAL HIGH (ref 0–44)
AST: 67 U/L — ABNORMAL HIGH (ref 15–41)
Albumin: 1.9 g/dL — ABNORMAL LOW (ref 3.5–5.0)
Alkaline Phosphatase: 454 U/L — ABNORMAL HIGH (ref 38–126)
Anion gap: 4 — ABNORMAL LOW (ref 5–15)
BUN: 28 mg/dL — ABNORMAL HIGH (ref 6–20)
CO2: 30 mmol/L (ref 22–32)
Calcium: 7.8 mg/dL — ABNORMAL LOW (ref 8.9–10.3)
Chloride: 103 mmol/L (ref 98–111)
Creatinine, Ser: <0.3 mg/dL — ABNORMAL LOW (ref 0.61–1.24)
Glucose, Bld: 115 mg/dL — ABNORMAL HIGH (ref 70–99)
Potassium: 3.6 mmol/L (ref 3.5–5.1)
Sodium: 137 mmol/L (ref 135–145)
Total Bilirubin: 1.5 mg/dL — ABNORMAL HIGH (ref 0.3–1.2)
Total Protein: 4.9 g/dL — ABNORMAL LOW (ref 6.5–8.1)

## 2020-12-17 LAB — PHOSPHORUS: Phosphorus: 2.1 mg/dL — ABNORMAL LOW (ref 2.5–4.6)

## 2020-12-17 LAB — MAGNESIUM: Magnesium: 2.1 mg/dL (ref 1.7–2.4)

## 2020-12-17 LAB — TRIGLYCERIDES: Triglycerides: 212 mg/dL — ABNORMAL HIGH (ref <150)

## 2020-12-17 MED ORDER — TRAVASOL 10 % IV SOLN
INTRAVENOUS | Status: AC
  Filled 2020-12-17: qty 1020

## 2020-12-17 NOTE — Plan of Care (Signed)
  Problem: Clinical Measurements: Goal: Ability to maintain clinical measurements within normal limits will improve Outcome: Progressing Goal: Will remain free from infection Outcome: Progressing Goal: Diagnostic test results will improve Outcome: Progressing Goal: Respiratory complications will improve Outcome: Progressing Goal: Cardiovascular complication will be avoided Outcome: Progressing   Problem: Pain Managment: Goal: General experience of comfort will improve Outcome: Progressing   Pt is alert and oriented. V/S stable. Complained of pain on his abdomen; Dilaudid IV given. NGT in place; draining dark brown to bloody output. Dr Lysle Pearl informed. H&H ordered. JP in place; drained 14ms of bloody output. Pt has non-productive cough; scheduled inhaler given. Neb tx refused. Encouraged to use IS.

## 2020-12-17 NOTE — Progress Notes (Signed)
Patient ID: Marcus [name], male   DOB: 27-Mar-1961, 60 y.o.   MRN: [id number]  PROGRESS NOTE    Marcus [name]  [id number] DOB: 1961-01-27 DOA: 12/02/2020 PCP: [name], FNP   Brief Narrative:  60 y.o. male with history of COPD, rheumatoid arthritis, Felty syndrome, chronic respiratory failure on 2 L of oxygen, tobacco use, history of MRSA pneumonia presented secondary to shortness of breath and was found to have gastric perforation with concern for intra-abdominal abscess.  Patient was started on empiric antibiotics and general surgery was consulted.  He was treated with conservative management.  TPN was started for nutrition.  During hospitalization, he developed worsening neutropenia in the setting of underlying Felty syndrome requiring Granix by hematology/oncology.  He also developed significant thrombocytopenia without bleeding.  HIT panel was negative.  Repeat CT of abdomen/pelvis with persistent perforation/fluid collection.  He underwent surgical intervention on 12/11/2020.  Assessment & Plan:   Sepsis: Present on admission -Cultures negative so far.  Currently hemodynamically stable. -No temperature spikes over the last 24 hours.  Perforated gastric ulcer with intra-abdominal abscess -Initially treated with conservatively with IV antibiotics and TPN. -Repeat CT of abdomen and pelvis on 12/10/2020 showed evidence of persistent perforation in addition to fluid collection. -Status post surgical intervention on 12/11/2020.  Currently has NG tube.  Currently n.p.o.; diet advancement as per general surgery.  Wound care as per general surgery. -Continue IV meropenem.  ID following.  Felty syndrome Neutropenia Rheumatoid arthritis Status post splenectomy -Heme-onc on board.   -Granix has been resumed by oncology again on 12/14/2020.  -Patient has been on chronic prednisone.  Switched prednisone to IV Solu-Medrol on 12/15/2020.  Acute thrombocytopenia -Questionable cause.  HIT testing  negative. Lovenox discontinued. -Has received multiple units of platelet transfusions during this hospitalization.  Platelets 58 today.  Received a unit of platelets again on 12/15/2020.  Oncology following.  Elevated LFTs with hyperbilirubinemia -Concerning for biliary pathology.  CT abdomen/pelvis without evidence of biliary disease.  Improving slowly.  Monitor intermittently  COPD -Stable.  Continue current inhaler regimen.  Abdominal aortic aneurysm 3.2 x 3.0 cm.  Recommendation for follow-up ultrasound every 3 years   DVT prophylaxis: SCDs Code Status: Full Family Communication: None at bedside Disposition Plan: Status is: Inpatient  Remains inpatient appropriate because:Inpatient level of care appropriate due to severity of illness  Dispo: The patient is from: Home              Anticipated d/c is to: Home              Patient currently is not medically stable to d/c.   Difficult to place patient No  Consultants: General surgery; hematology/oncology; ID  Procedures: Robotic assisted partial sleeve gastrectomy/cholecystectomy/incisional hernia repair on 12/11/2020  Antimicrobials:  Anti-infectives (From admission, onward)    Start     Dose/Rate Route Frequency Ordered Stop   12/08/20 1400  meropenem (MERREM) 1 g in sodium chloride 0.9 % 100 mL IVPB        1 g 200 mL/hr over 30 Minutes Intravenous Every 8 hours 12/07/20 1611     12/07/20 1600  ertapenem (INVANZ) 1,000 mg in sodium chloride 0.9 % 100 mL IVPB  Status:  Discontinued       Note to Pharmacy: Thrombocytopenia possibly due to cefepime, hematology suggested to change antibiotics   1 g 200 mL/hr over 30 Minutes Intravenous Every 24 hours 12/07/20 0836 12/07/20 1611   12/03/20 1400  ceFEPIme (MAXIPIME) 2 g in  sodium chloride 0.9 % 100 mL IVPB  Status:  Discontinued        2 g 200 mL/hr over 30 Minutes Intravenous Every 8 hours 12/03/20 0637 12/07/20 0836   12/03/20 0545  ceFEPIme (MAXIPIME) 2 g in sodium chloride  0.9 % 100 mL IVPB        2 g 200 mL/hr over 30 Minutes Intravenous  Once 12/03/20 0532 12/03/20 0638   12/03/20 0545  metroNIDAZOLE (FLAGYL) IVPB 500 mg  Status:  Discontinued        500 mg 100 mL/hr over 60 Minutes Intravenous Every 12 hours 12/03/20 0532 12/07/20 1534   12/03/20 0330  piperacillin-tazobactam (ZOSYN) IVPB 3.375 g        3.375 g 100 mL/hr over 30 Minutes Intravenous  Once 12/03/20 0324 12/03/20 0401        Subjective: Patient seen and examined at bedside.  Poor historian.  No vomiting, fever, chest pain reported.   Objective: Vitals:   12/16/20 2050 12/17/20 0456 12/17/20 0505 12/17/20 0750  BP:   130/78 (!) 120/50  Pulse:   83 95  Resp:   18   Temp:   98 F (36.7 C) 98.4 F (36.9 C)  TempSrc:   Oral Oral  SpO2: 99%  98% 98%  Weight:  58.5 kg    Height:        Intake/Output Summary (Last 24 hours) at 12/17/2020 0753 Last data filed at 12/17/2020 0400 Gross per 24 hour  Intake 2179.22 ml  Output 2210 ml  Net -30.78 ml    Filed Weights   12/15/20 0516 12/16/20 0500 12/17/20 0456  Weight: 59.2 kg 59.1 kg 58.5 kg    Examination:  General exam: On 3 L oxygen by nasal cannula.  No acute distress.  Looks chronically ill and deconditioned. ENT: NG tube is still present Respiratory system: Decreased breath sounds at bases bilaterally with scattered crackles  cardiovascular system: Rate controlled, S1-S2 heard gastrointestinal system: Abdomen is slightly distended, mildly tender diffusely.  Sluggish bowel sounds  extremities: Trace lower extremity edema present bilaterally; no clubbing  Central nervous system: Still very slow to respond to questions.  Poor historian.  No focal neurological deficits.  Moves extremities skin: No obvious petechiae/other rashes psychiatry: Flat affect.  Does not participate in conversation much.  Data Reviewed: I have personally reviewed following labs and imaging studies  CBC: Recent Labs  Lab 12/14/20 0807 12/15/20 0420  12/15/20 1512 12/16/20 0518 12/16/20 2030 12/17/20 0512  WBC 1.3* 1.6* 3.0* 6.0  --  14.5*  NEUTROABS 0.3* 0.8* 2.4 5.1  --  13.3*  HGB 10.0* 8.3* 9.1* 8.8* 9.0* 8.8*  HCT 29.3* 24.5* 26.1* 26.2* 26.8* 26.6*  MCV 82.5 83.3 81.8 84.2  --  84.7  PLT 36* 42* 65* 61*  --  58*    Basic Metabolic Panel: Recent Labs  Lab 12/11/20 0644 12/12/20 0328 12/13/20 0525 12/14/20 0525 12/15/20 0420 12/16/20 0518 12/17/20 0512  NA 131*   < > 134* 129* 132* 137 137  K 4.5   < > 3.9 4.2 3.5 3.4* 3.6  CL 100   < > 101 97* 98 103 103  CO2 27   < > 28 27 34* 31 30  GLUCOSE 147*   < > 146* 123* 117* 145* 115*  BUN 24*   < > 29* 24* 23* 22* 28*  CREATININE <0.30*   < > <0.30* <0.30* <0.30* <0.30* <0.30*  CALCIUM 7.9*   < > 7.7* 8.0* 7.7*  7.7* 7.8*  MG  --   --  2.3  --  2.2 2.3 2.1  PHOS 3.8  --  2.9  --   --   --  2.1*   < > = values in this interval not displayed.    GFR: CrCl cannot be calculated (This lab value cannot be used to calculate CrCl because it is not a number: <0.30). Liver Function Tests: Recent Labs  Lab 12/11/20 0644 12/12/20 0328 12/13/20 0525 12/15/20 0420 12/17/20 0512  AST 35 76* 89* 58* 67*  ALT 12 23 39 46* 57*  ALKPHOS 214* 177* 221* 294* 454*  BILITOT 2.7* 3.2* 2.3* 1.7* 1.5*  PROT 5.1* 4.9* 4.9* 4.7* 4.9*  ALBUMIN 2.0* 2.2* 2.1* 1.8* 1.9*    No results for input(s): LIPASE, AMYLASE in the last 168 hours. No results for input(s): AMMONIA in the last 168 hours. Coagulation Profile: Recent Labs  Lab 12/11/20 1840 12/12/20 0328 12/14/20 0525 12/15/20 0420 12/16/20 0518  INR 1.1 1.1 1.0 1.0 0.9    Cardiac Enzymes: No results for input(s): CKTOTAL, CKMB, CKMBINDEX, TROPONINI in the last 168 hours. BNP (last 3 results) No results for input(s): PROBNP in the last 8760 hours. HbA1C: No results for input(s): HGBA1C in the last 72 hours. CBG: Recent Labs  Lab 12/15/20 1659 12/15/20 2332 12/16/20 0513 12/16/20 1139 12/17/20 0000  GLUCAP 155*  157* 132* 168* 145*    Lipid Profile: Recent Labs    12/17/20 0512  TRIG 212*    Thyroid Function Tests: No results for input(s): TSH, T4TOTAL, FREET4, T3FREE, THYROIDAB in the last 72 hours. Anemia Panel: Recent Labs    12/15/20 0420  RETICCTPCT 2.9    Sepsis Labs: No results for input(s): PROCALCITON, LATICACIDVEN in the last 168 hours.  Recent Results (from the past 240 hour(s))  MRSA Next Gen by PCR, Nasal     Status: None   Collection Time: 12/10/20  9:20 PM   Specimen: Nasal Mucosa; Nasal Swab  Result Value Ref Range Status   MRSA by PCR Next Gen NOT DETECTED NOT DETECTED Final    Comment: (NOTE) The GeneXpert MRSA Assay (FDA approved for NASAL specimens only), is one component of a comprehensive MRSA colonization surveillance program. It is not intended to diagnose MRSA infection nor to guide or monitor treatment for MRSA infections. Test performance is not FDA approved in patients less than 1 years old. Performed at [facility], [address], [place], [place] [address]           Radiology Studies: No results found.      Scheduled Meds:  Chlorhexidine Gluconate Cloth  6 each Topical Daily   insulin aspart  0-9 Units Subcutaneous Q6H   methylPREDNISolone (SOLU-MEDROL) injection  20 mg Intravenous Daily   mometasone-formoterol  2 puff Inhalation BID   nicotine  21 mg Transdermal Daily   pantoprazole (PROTONIX) IV  40 mg Intravenous BID   sodium chloride flush  10-40 mL Intracatheter Q12H   Tbo-filgastrim (GRANIX) SQ  480 mcg Subcutaneous Daily   Continuous Infusions:  meropenem (MERREM) IV 1 g (12/17/20 0518)   TPN ADULT (ION) 85 mL/hr at 12/17/20 0341          Aline August, MD [facility] 12/17/2020, 7:53 AM

## 2020-12-17 NOTE — Progress Notes (Addendum)
Patient's left foot big toe nail and left foot fourth toe nail noted to have discoloration. MD made aware.  Patient also noted to have stool in blood, MD made aware during rounds.  Patient has been refusing baths as well as PT during shift.  Patients states he just wants to "give up". Patient encouraged to move as much as possible to prevent blood clots, help with mobility and pain management. Patient given pain medication as ordered for pain.  Patient states even with prescribed medication he still does not want to move. No acute distress noted at this time. Will continue to monitor.

## 2020-12-17 NOTE — Progress Notes (Signed)
PHARMACY - TOTAL PARENTERAL NUTRITION CONSULT NOTE   Indication: perforated gastric ulcer  Patient Measurements: Height: '5\' 10"'$  (177.8 cm) Weight: 58.5 kg (128 lb 15.5 oz) IBW/kg (Calculated) : 73 TPN AdjBW (KG): 59.3 Body mass index is 18.51 kg/m.  Assessment:  admitted multiple times recently with hemoptysis and gastric bleeding s/p EGD and clipping in fundus, with cavitary pneumonia, with seizures.  Has history of drug use and was cocaine positive on one of his admissions.  Has also hx of Felty syndrome with neutropenia.  He has surgical history of splenectomy in 2017 and exploratory laparotomy with Phillip Heal patch repair of a pyloric channel perforated ulcer in 2021  Glucose / Insulin: on prednisone 10 bid>> chg to methylprednisolone 20 mg IV daily on 9/11 BG 119-157 no hx DM,  5 units SSI /24 hrs  Electrolytes: Corr Ca 9.48 mg/dL, Phos 2.1 Renal: Scr < 1, stable Hepatic: ASt/ALT 58/46 09/10; TG 191>418>151>212 Intake / Output; MIVF: no MIVF (+) 8.49 L GI Imaging:perforated gastric ulcer GI Surgeries / Procedures: s/p robotic assisted repair of gastric perforation  ABX: meropenem (ID following)  Central access: 12/07/20 TPN start date: 12/07/20   Nutritional Goals: Goal TPN rate is 85 mL/hr (provides 102 g of protein and 2060 kcals per day)  RD Assessment: Estimated Needs Total Energy Estimated Needs: 1900-2200kcal/day Total Protein Estimated Needs: 95-110g/day Total Fluid Estimated Needs: 1.8-2.1L/day  Current Nutrition:  NPO  Plan:  Continue TPN at goal rate: 85 mL/hr at 1800 (total volume including overfill: 2140 mL)  Nutritional components: Amino acids (Travasol 10%): 102 grams Lipids (20% SMOF): 49 grams; TG slightly increased at 212, will order follow up level for Thursday and if continues to increase will adjust lipids Dextrose: 306 grams kCal: 1938 / 24h Electrolytes in TPN: Na 90 mEq/L (slight decrease from 1228mq to 90 meq on 9/11), K 15 mEq/L (added K back  to TPN 9/11), Ca 510m/L, Mg 28m528mL, and Phos 47m68mL (increased 9/12). Cl:Ac 1:1.   Phos 2.1 - will increase from 15 to 18 mmol/L Continue Sensitive q6h SSI and adjust as needed. On methylprednisolone IV, no hx DM   Will recheck electrolytes with AM labs and monitor TPN labs on Mon/Thurs  SamaSherilyn BankerarmD 12/17/2020,7:16 AM

## 2020-12-17 NOTE — Progress Notes (Signed)
PT Cancellation Note  Patient Details Name: [name] MRN: [id number] DOB: 09-24-1960   Cancelled Treatment:    Reason Eval/Treat Not Completed: Pain limiting ability to participate.  Chart reviewed.  Nurse cleared pt for therapy participation.  Pt resting in bed upon PT arrival and declining therapy session--pt reporting d/t having too much abdominal pain (nurse present and aware).  Therapist discussed with pt trying to see pt after receiving more pain medication but pt continued to refuse therapy d/t pain issues.  Attempted to discuss with pt how to set things up so pt felt he could participate in therapy but pt appearing frustrated and started talking about how he couldn't eat and that he wasn't going to do anything.  MD and pt's nurse present with pt when therapist left pt's room.  Will re-attempt PT session at a later date/time.  Leitha Bleak, PT 12/17/20, 12:12 PM

## 2020-12-17 NOTE — Progress Notes (Signed)
[facility] Day(s): [place] op day(s): 6 Days Post-Op.   Interval History:  Patient seen and examined No acute events or new complaints overnight.  Patient reports he is having worsening left sided abdominal pain this morning compared to days prior; shaking No fever, chills, nausea He does have a leukocytosis this morning to 14.5K; was started on IV steroids on 09/10 Hgb is stable this morning at 8.8 PLT relatively stable, but low, at 58K Renal function remains normal; sCr - <0.30; UO - 1.7L Mild hypophosphatemia to 2.0 o/w no significant electrolyte derangements NGT with 400 ccs recorded (nothing recorded overnight) Surgical drain with 60 ccs out; thin sanguinous  He remains strict NPO; on TPN  Vital signs in last 24 hours: [min-max] current  Temp:  [97.7 F (36.5 C)-98 F (36.7 C)] 98 F (36.7 C) (09/12 0505) Pulse Rate:  [70-83] 83 (09/12 0505) Resp:  [18-20] 18 (09/12 0505) BP: (118-130)/(72-78) 130/78 (09/12 0505) SpO2:  [98 %-99 %] 98 % (09/12 0505) Weight:  [58.5 kg] 58.5 kg (09/12 0456)     Height: '5\' 10"'$  (177.8 cm) Weight: 58.5 kg BMI (Calculated): 18.51   Intake/Output last 2 shifts:  09/11 0701 - 09/12 0700 In: 2179.2 [I.V.:1879.2; IV Piggyback:300] Out: 2210 [Urine:1750; Emesis/NG output:400; Drains:60]   Physical Exam:  Constitutional: alert, cooperative and no distress  HEENT: NGT in place; good positioning Respiratory: breathing non-labored at rest  Cardiovascular: regular rate and sinus rhythm  Gastrointestinal: Soft, incisional soreness, non-distended, no rebound/guarding. Surgical drain in place; thin sanguinous drainage Integumentary: Laparoscopic incisions are CDI with dermabond, no erythema or drainage   Labs:  CBC Latest Ref Rng & Units 12/17/2020 12/16/2020 12/16/2020  WBC 4.0 - 10.5 K/uL 14.5(H) - 6.0  Hemoglobin 13.0 - 17.0 g/dL 8.8(L) 9.0(L) 8.8(L)  Hematocrit 39.0 - 52.0 % 26.6(L) 26.8(L)  26.2(L)  Platelets 150 - 400 K/uL 58(L) - 61(L)   CMP Latest Ref Rng & Units 12/17/2020 12/16/2020 12/15/2020  Glucose 70 - 99 mg/dL 115(H) 145(H) 117(H)  BUN 6 - 20 mg/dL 28(H) 22(H) 23(H)  Creatinine 0.61 - 1.24 mg/dL <0.30(L) <0.30(L) <0.30(L)  Sodium 135 - 145 mmol/L 137 137 132(L)  Potassium 3.5 - 5.1 mmol/L 3.6 3.4(L) 3.5  Chloride 98 - 111 mmol/L 103 103 98  CO2 22 - 32 mmol/L 30 31 34(H)  Calcium 8.9 - 10.3 mg/dL 7.8(L) 7.7(L) 7.7(L)  Total Protein 6.5 - 8.1 g/dL 4.9(L) - 4.7(L)  Total Bilirubin 0.3 - 1.2 mg/dL 1.5(H) - 1.7(H)  Alkaline Phos 38 - 126 U/L 454(H) - 294(H)  AST 15 - 41 U/L 67(H) - 58(H)  ALT 0 - 44 U/L 57(H) - 46(H)     Imaging studies: No new pertinent imaging studies   Assessment/Plan:  60 y.o. male with concerns for persistent leak on UGI on 09/09 6 Days Post-Op s/p robotic assisted laparoscopic partial sleeve gastrectomy, cholecystectomy, and incisional hernia repair for proximal posterior gastric perforation with abscess and cholecystitis   - He should continue to remain strict NPO for now - Continue TPN at goal rate; monitor and replete electrolytes   - Continue NGT decompression for now; monitor and record output              - Continue IV Abx (meropenem); ID on board   - Monitor leukocytosis; ? IV steroids vs worsening intra-abdominal process, may need repeat CT Abdomen/Pelvis in next 24 hours - Monitor abdominal examination; on-going bowel function - Pain control  prn; antiemetics prn - Okay to ambulate with PT; recommending SNF             - Appreciate hematology/oncology input             - Appreciate hospitalist assistance  All of the above findings and recommendations were discussed with the patient, and the medical team, and all of patient's questions were answered to his expressed satisfaction.  -- Edison Simon, PA-C [facility] 12/17/2020, 7:42 AM [phone] M-F: 7am - 4pm

## 2020-12-17 NOTE — Progress Notes (Signed)
Marcus [name]   DOB:Jun 19, 1960   [id number]    Subjective: No acute events overnight.  Continues to have abdominal pain not any worse.  Objective:  Vitals:   12/17/20 1720 12/17/20 2006  BP: 107/66 112/68  Pulse: 81 72  Resp: (!) 22 18  Temp: 97.7 F (36.5 C) 97.7 F (36.5 C)  SpO2: 98% 100%     Intake/Output Summary (Last 24 hours) at 12/17/2020 2316 Last data filed at 12/17/2020 2305 Gross per 24 hour  Intake 2444.15 ml  Output 1540 ml  Net 904.15 ml    Physical Exam Vitals and nursing note reviewed.  HENT:     Head: Normocephalic and atraumatic.     Mouth/Throat:     Pharynx: Oropharynx is clear.  Eyes:     Extraocular Movements: Extraocular movements intact.     Pupils: Pupils are equal, round, and reactive to light.  Cardiovascular:     Rate and Rhythm: Normal rate and regular rhythm.  Pulmonary:     Comments: Decreased breath sounds bilaterally.  Abdominal:     Palpations: Abdomen is soft.     Comments: Abdominal binder in place.  Musculoskeletal:        General: Normal range of motion.     Cervical back: Normal range of motion.  Skin:    General: Skin is warm.  Neurological:     General: No focal deficit present.     Mental Status: He is alert and oriented to person, place, and time.  Psychiatric:        Behavior: Behavior normal.        Judgment: Judgment normal.     Labs:  Lab Results  Component Value Date   WBC 14.5 (H) 12/17/2020   HGB 8.8 (L) 12/17/2020   HCT 26.6 (L) 12/17/2020   MCV 84.7 12/17/2020   PLT 58 (L) 12/17/2020   NEUTROABS 13.3 (H) 12/17/2020    Lab Results  Component Value Date   NA 137 12/17/2020   K 3.6 12/17/2020   CL 103 12/17/2020   CO2 30 12/17/2020    Studies:  No results found.  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  Currently on granix.  White count 14; discontinue Granix.  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  Platelets improving in 26s.  HIT panel negative; and since patient's thrombocytopenia is from DIC.  I think it is reasonable to resume heparin/Lovenox as deemed necessary by primary service.  #Gastric perforation s/p surgery-posterior collection- s/p robot assisted partial sleeve gastrectomy; Incisional hernia repair/ cholecystectomy-on antibiotics per ID-stable.    Cammie Sickle, MD 12/17/2020

## 2020-12-18 ENCOUNTER — Inpatient Hospital Stay: Payer: Medicaid Other

## 2020-12-18 ENCOUNTER — Encounter: Payer: Self-pay | Admitting: Family Medicine

## 2020-12-18 LAB — BASIC METABOLIC PANEL
Anion gap: 6 (ref 5–15)
BUN: 27 mg/dL — ABNORMAL HIGH (ref 6–20)
CO2: 28 mmol/L (ref 22–32)
Calcium: 7.6 mg/dL — ABNORMAL LOW (ref 8.9–10.3)
Chloride: 102 mmol/L (ref 98–111)
Creatinine, Ser: <0.3 mg/dL — ABNORMAL LOW (ref 0.61–1.24)
Glucose, Bld: 96 mg/dL (ref 70–99)
Potassium: 3.6 mmol/L (ref 3.5–5.1)
Sodium: 136 mmol/L (ref 135–145)

## 2020-12-18 LAB — GLUCOSE, CAPILLARY
Glucose-Capillary: 115 mg/dL — ABNORMAL HIGH (ref 70–99)
Glucose-Capillary: 122 mg/dL — ABNORMAL HIGH (ref 70–99)
Glucose-Capillary: 148 mg/dL — ABNORMAL HIGH (ref 70–99)
Glucose-Capillary: 154 mg/dL — ABNORMAL HIGH (ref 70–99)

## 2020-12-18 LAB — CBC
HCT: 25.6 % — ABNORMAL LOW (ref 39.0–52.0)
Hemoglobin: 8.7 g/dL — ABNORMAL LOW (ref 13.0–17.0)
MCH: 28.9 pg (ref 26.0–34.0)
MCHC: 34 g/dL (ref 30.0–36.0)
MCV: 85 fL (ref 80.0–100.0)
Platelets: 66 10*3/uL — ABNORMAL LOW (ref 150–400)
RBC: 3.01 MIL/uL — ABNORMAL LOW (ref 4.22–5.81)
RDW: 21.2 % — ABNORMAL HIGH (ref 11.5–15.5)
WBC: 15.1 10*3/uL — ABNORMAL HIGH (ref 4.0–10.5)
nRBC: 0.3 % — ABNORMAL HIGH (ref 0.0–0.2)

## 2020-12-18 LAB — PHOSPHORUS: Phosphorus: 2.3 mg/dL — ABNORMAL LOW (ref 2.5–4.6)

## 2020-12-18 LAB — MAGNESIUM: Magnesium: 2.1 mg/dL (ref 1.7–2.4)

## 2020-12-18 IMAGING — CT CT ABD-PELV W/ CM
2 of 5 series · 15 of 46 positions shown, 17 images · IV contrast (APPLIED)
Comparison: [DATE].

CLINICAL DATA: Abdominal abscess or infection. Gastric perforation.

EXAM:
CT ABDOMEN AND PELVIS WITH CONTRAST
TECHNIQUE: Multidetector CT imaging of the abdomen and pelvis was performed
using the standard protocol following bolus administration of
intravenous contrast.
CONTRAST:  75mL OMNIPAQUE IOHEXOL 350 MG/ML SOLN

[Series 2: routine abd/pel with · axial · 0.82mm/px · z∈[-511,-71]mm · 12 of 104 slices shown, 14 images]
[im 8/104  soft-tissue]
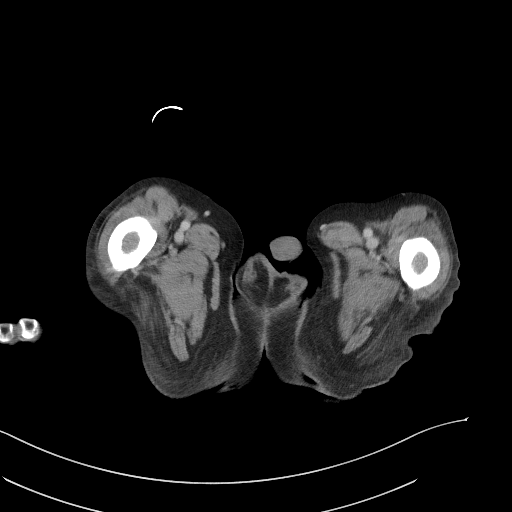
[im 8/104  bone]
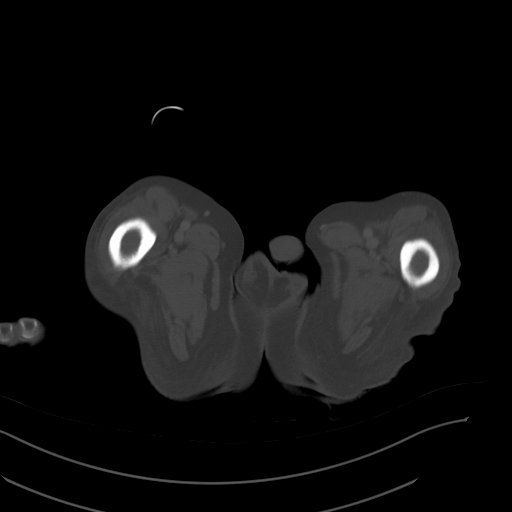
[im 15/104  soft-tissue]
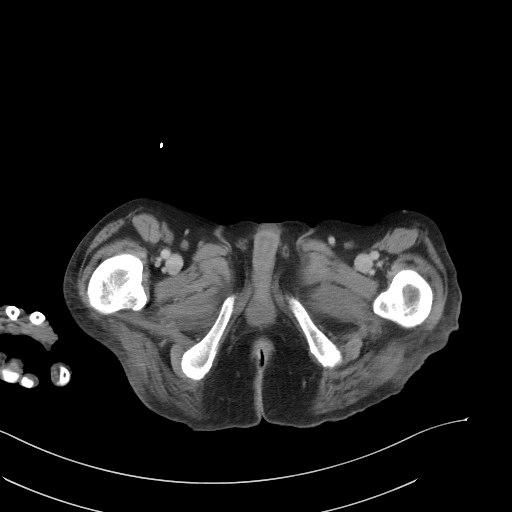
[im 23/104  soft-tissue]
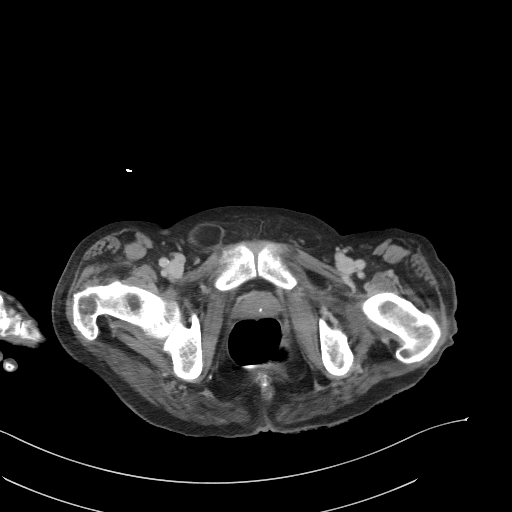
[im 30/104  soft-tissue]
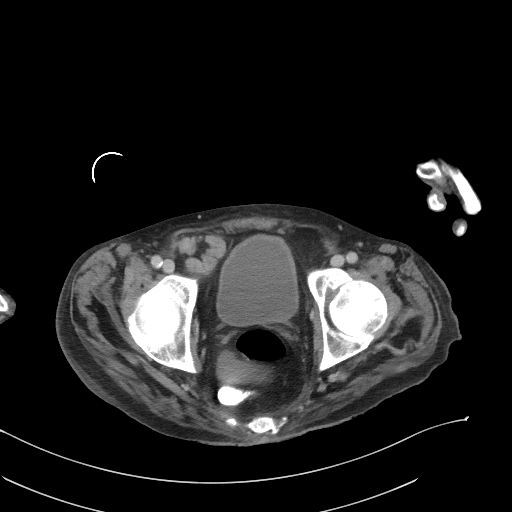
[im 37/104  soft-tissue]
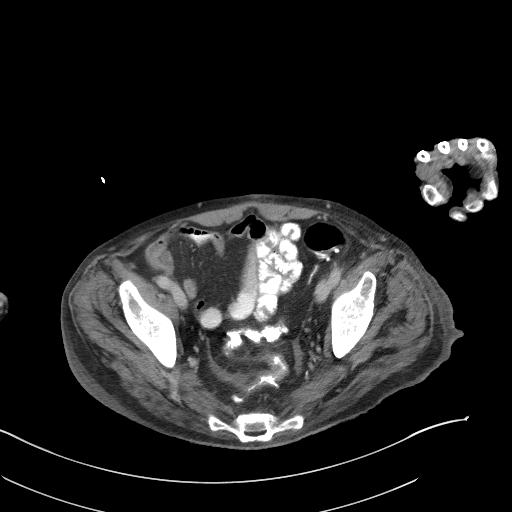
[im 45/104  soft-tissue]
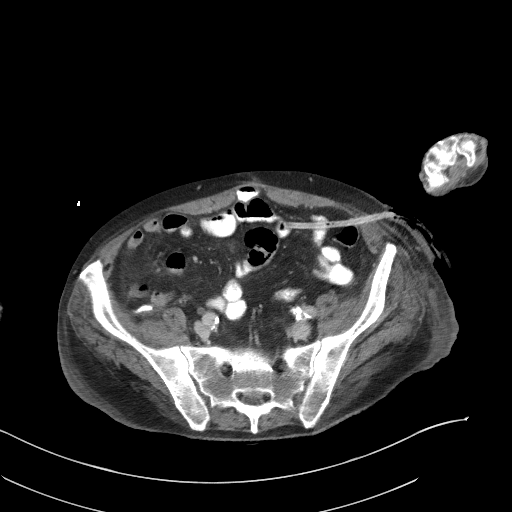
[im 59/104  soft-tissue]
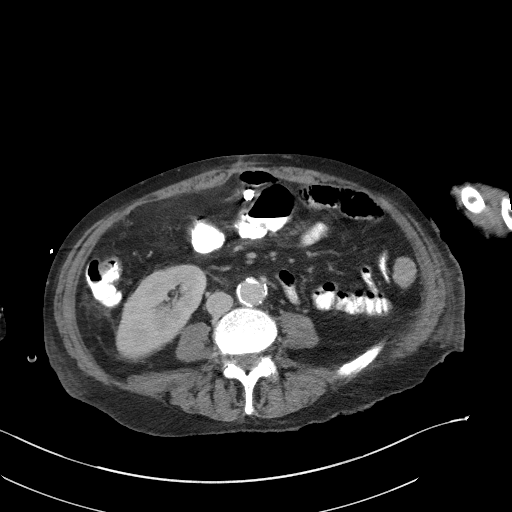
[im 67/104  soft-tissue]
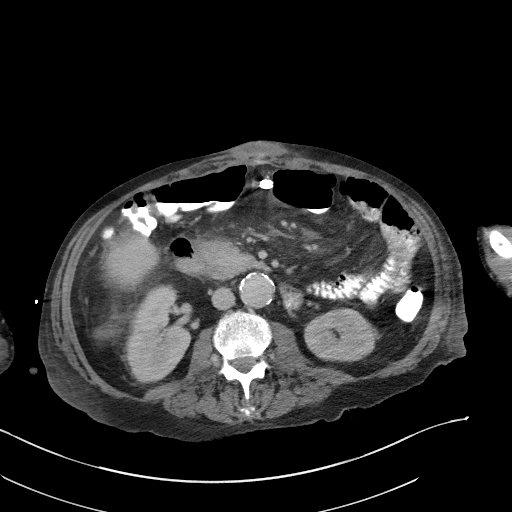
[im 74/104  soft-tissue]
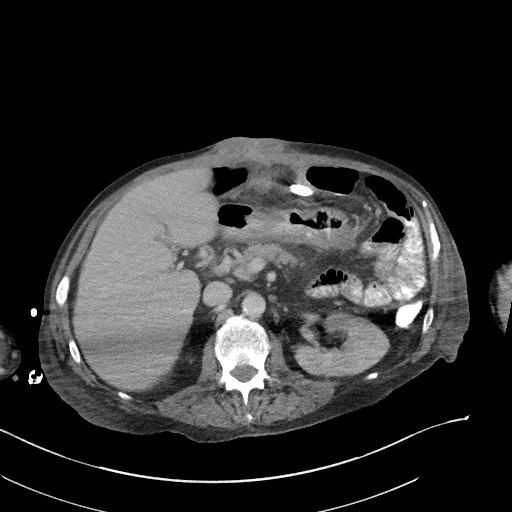
[im 74/104  bone]
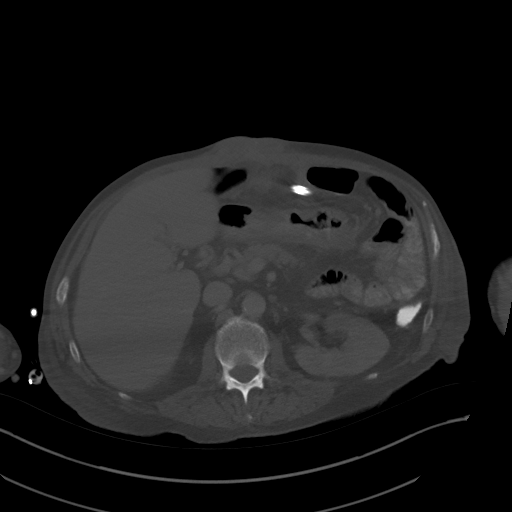
[im 81/104  soft-tissue]
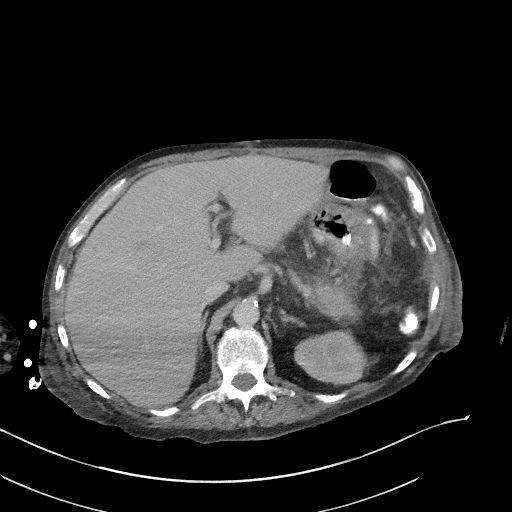
[im 89/104  soft-tissue]
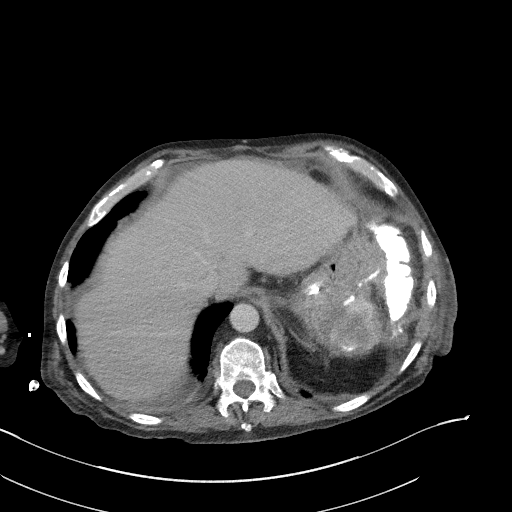
[im 96/104  soft-tissue]
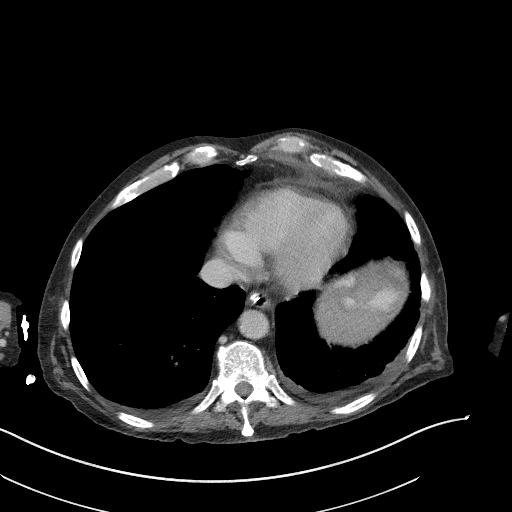

[Series 5: coronal st · coronal · 0.87mm/px · 3 of 91 slices shown]
[im 31/91  soft-tissue]
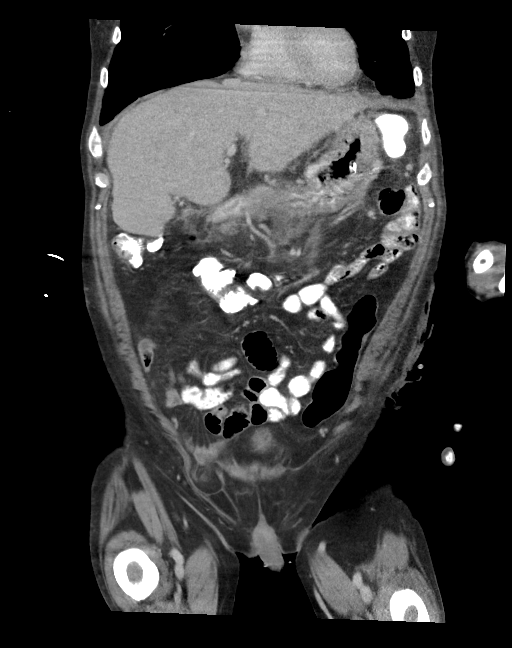
[im 41/91  soft-tissue]
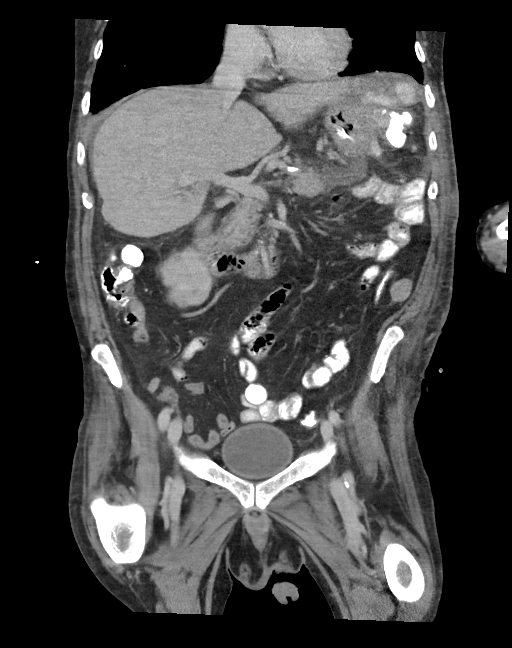
[im 51/91  soft-tissue]
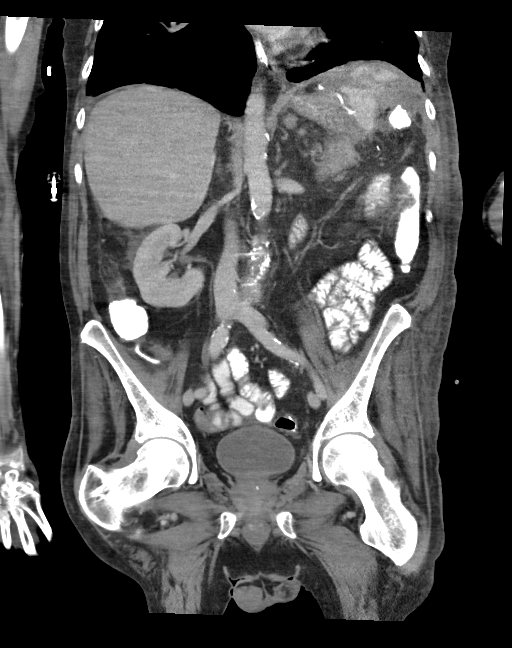

[15 of 46 positions shown; findings below may reference images not displayed]

FINDINGS: Lower chest: Minimal bilateral pleural effusions are noted with
minimal adjacent subsegmental atelectasis.

Hepatobiliary: Gallbladder is not well visualized currently. No
biliary dilatation is noted. Liver is unremarkable.

Pancreas: Unremarkable. No pancreatic ductal dilatation or
surrounding inflammatory changes.

Spleen: Status post splenectomy.

Adrenals/Urinary Tract: Adrenal glands are unremarkable. Kidneys are
normal, without renal calculi, focal lesion, or hydronephrosis.
Bladder is unremarkable.

Stomach/Bowel: Distal tip of nasogastric tube is seen in proximal
stomach. There is noted a large amount of contrast seen lateral to
the proximal stomach in the left upper quadrant concerning for
extravasation secondary to gastric perforation. Postsurgical clips
are noted. There is a surgical drain noted anterior to the mid to
distal stomach. No abnormal bowel dilatation is noted. Appendix is
unremarkable.

Vascular/Lymphatic: Atherosclerosis of thoracic aorta is noted. 3 cm
infrarenal abdominal aortic aneurysm is noted. No adenopathy is
noted.

Reproductive: Prostate is unremarkable.

Other: Small fat containing right inguinal hernia is noted. Minimal
free fluid is noted in the pelvis which most likely is postsurgical
in etiology.

Musculoskeletal: No acute or significant osseous findings.
IMPRESSION: Large amount of probable extravasated contrast is seen lateral to
the proximal stomach in the left upper quadrant concerning for
gastric perforation. These results will be called to the ordering
clinician or representative by the Radiologist Assistant, and
communication documented in the PACS or zVision Dashboard.

Surgical drain is noted with tip anterior to the midportion of the
stomach.

3 cm infrarenal abdominal aortic aneurysm. Recommend follow-up
ultrasound every 3 years. This recommendation follows ACR consensus
guidelines: White Paper of the ACR Incidental Findings Committee II

Small fat containing right inguinal hernia.

## 2020-12-18 MED ORDER — IOHEXOL 350 MG/ML SOLN
75.0000 mL | Freq: Once | INTRAVENOUS | Status: AC | PRN
  Administered 2020-12-18: 75 mL via INTRAVENOUS

## 2020-12-18 MED ORDER — IOHEXOL 9 MG/ML PO SOLN
500.0000 mL | Freq: Once | ORAL | Status: AC | PRN
  Administered 2020-12-18: 500 mL via ORAL

## 2020-12-18 MED ORDER — TRAVASOL 10 % IV SOLN
INTRAVENOUS | Status: DC
  Filled 2020-12-18: qty 1020

## 2020-12-18 NOTE — Progress Notes (Signed)
Patient ID: Marcus [name], male   DOB: [birth year], 60 y.o.   MRN: [id number]  PROGRESS NOTE    Marcus [name]  [id number] DOB: [birth year] DOA: 12/02/2020 PCP: [name], FNP   Brief Narrative:  60 y.o. male with history of COPD, rheumatoid arthritis, Felty syndrome, chronic respiratory failure on 2 L of oxygen, tobacco use, history of MRSA pneumonia presented secondary to shortness of breath and was found to have gastric perforation with concern for intra-abdominal abscess.  Patient was started on empiric antibiotics and general surgery was consulted.  He was treated with conservative management.  TPN was started for nutrition.  During hospitalization, he developed worsening neutropenia in the setting of underlying Felty syndrome requiring Granix by hematology/oncology.  He also developed significant thrombocytopenia without bleeding.  HIT panel was negative.  Repeat CT of abdomen/pelvis with persistent perforation/fluid collection.  He underwent surgical intervention on 12/11/2020.  Assessment & Plan:   Sepsis: Present on admission -Cultures negative so far.  Currently hemodynamically stable. -No temperature spikes over the last 24 hours.  Perforated gastric ulcer with intra-abdominal abscess -Initially treated with conservatively with IV antibiotics and TPN. -Repeat CT of abdomen and pelvis on 12/10/2020 showed evidence of persistent perforation in addition to fluid collection. -Status post surgical intervention on 12/11/2020.  Currently has NG tube.  Currently n.p.o.; diet advancement as per general surgery.  Wound care as per general surgery.  General surgery is planning to repeat CT of the abdomen and pelvis today. -Continue IV meropenem.  ID following.  Felty syndrome Neutropenia Rheumatoid arthritis Status post splenectomy Leukocytosis -Heme-onc on board.   -Granix discontinued on 12/17/2020 by oncology because of leukocytosis -Patient has been on chronic prednisone.  Switched  prednisone to IV Solu-Medrol on 12/15/2020.  We will switch back to oral prednisone once he is taking oral meds.  Acute thrombocytopenia -Questionable cause.  HIT testing negative. Lovenox discontinued. -Has received multiple units of platelet transfusions during this hospitalization.  Platelets 66 today.  Received a unit of platelets again on 12/15/2020.  Oncology following and recommending that Lovenox/heparin can be resumed if necessary.  Will not resume Lovenox/heparin till cleared by general surgery.  Elevated LFTs with hyperbilirubinemia -Concerning for biliary pathology.  CT abdomen/pelvis without evidence of biliary disease.  Improving slowly.  Monitor intermittently  COPD -Stable.  Continue current inhaler regimen.  Abdominal aortic aneurysm 3.2 x 3.0 cm.  Recommendation for follow-up ultrasound every 3 years  Generalized deconditioning -Patient refusing SNF placement.  Also refusing to participate in PT due to pain issues. -If condition does not improve, might need to consider palliative care consultation for goals of care discussion   DVT prophylaxis: SCDs Code Status: Full Family Communication: None at bedside Disposition Plan: Status is: Inpatient  Remains inpatient appropriate because:Inpatient level of care appropriate due to severity of illness  Dispo: The patient is from: Home              Anticipated d/c is to: Home              Patient currently is not medically stable to d/c.   Difficult to place patient No  Consultants: General surgery; hematology/oncology; ID  Procedures: Robotic assisted partial sleeve gastrectomy/cholecystectomy/incisional hernia repair on 12/11/2020  Antimicrobials:  Anti-infectives (From admission, onward)    Start     Dose/Rate Route Frequency Ordered Stop   12/08/20 1400  meropenem (MERREM) 1 g in sodium chloride 0.9 % 100 mL IVPB  1 g 200 mL/hr over 30 Minutes Intravenous Every 8 hours 12/07/20 1611     12/07/20 1600   ertapenem (INVANZ) 1,000 mg in sodium chloride 0.9 % 100 mL IVPB  Status:  Discontinued       Note to Pharmacy: Thrombocytopenia possibly due to cefepime, hematology suggested to change antibiotics   1 g 200 mL/hr over 30 Minutes Intravenous Every 24 hours 12/07/20 0836 12/07/20 1611   12/03/20 1400  ceFEPIme (MAXIPIME) 2 g in sodium chloride 0.9 % 100 mL IVPB  Status:  Discontinued        2 g 200 mL/hr over 30 Minutes Intravenous Every 8 hours 12/03/20 0637 12/07/20 0836   12/03/20 0545  ceFEPIme (MAXIPIME) 2 g in sodium chloride 0.9 % 100 mL IVPB        2 g 200 mL/hr over 30 Minutes Intravenous  Once 12/03/20 0532 12/03/20 0638   12/03/20 0545  metroNIDAZOLE (FLAGYL) IVPB 500 mg  Status:  Discontinued        500 mg 100 mL/hr over 60 Minutes Intravenous Every 12 hours 12/03/20 0532 12/07/20 1534   12/03/20 0330  piperacillin-tazobactam (ZOSYN) IVPB 3.375 g        3.375 g 100 mL/hr over 30 Minutes Intravenous  Once 12/03/20 0324 12/03/20 0401        Subjective: Patient seen and examined at bedside.  Poor historian.  No fever, vomiting or worsening shortness of breath reported.  Nursing staff reports that patient refused to participate in PT yesterday.   Objective: Vitals:   12/17/20 1720 12/17/20 2006 12/18/20 0441 12/18/20 0733  BP: 107/66 112/68 120/73 124/71  Pulse: 81 72 71 71  Resp: (!) '22 18 18 18  '$ Temp: 97.7 F (36.5 C) 97.7 F (36.5 C) 97.6 F (36.4 C) 98.3 F (36.8 C)  TempSrc: Oral Oral Oral   SpO2: 98% 100% 100% 100%  Weight:      Height:        Intake/Output Summary (Last 24 hours) at 12/18/2020 0813 Last data filed at 12/18/2020 0745 Gross per 24 hour  Intake 2259.97 ml  Output 1212 ml  Net 1047.97 ml    Filed Weights   12/15/20 0516 12/16/20 0500 12/17/20 0456  Weight: 59.2 kg 59.1 kg 58.5 kg    Examination:  General exam: No distress.  Currently on 3 L oxygen via nasal cannula.  Looks chronically ill and deconditioned. ENT: NG tube present   respiratory system: Bilateral decreased breath sounds at bases with some crackles  cardiovascular system: S1-S2 heard, rate controlled  gastrointestinal system: Abdomen is still distended, mild tenderness diffusely.  Bowel sounds are sluggish.  Surgical drain present. extremities: No cyanosis; mild lower extremity edema present  Central nervous system: Sleepy, wakes up only slightly, hardly answers any questions.  Poor historian.  No focal neurological deficits.  Moving extremities skin: No obvious ecchymosis/other lesions  psychiatry: Hardly participates in any conversation.  Affect is extremely flat   Data Reviewed: I have personally reviewed following labs and imaging studies  CBC: Recent Labs  Lab 12/14/20 0807 12/15/20 0420 12/15/20 1512 12/16/20 0518 12/16/20 2030 12/17/20 0512 12/18/20 0540  WBC 1.3* 1.6* 3.0* 6.0  --  14.5* 15.1*  NEUTROABS 0.3* 0.8* 2.4 5.1  --  13.3*  --   HGB 10.0* 8.3* 9.1* 8.8* 9.0* 8.8* 8.7*  HCT 29.3* 24.5* 26.1* 26.2* 26.8* 26.6* 25.6*  MCV 82.5 83.3 81.8 84.2  --  84.7 85.0  PLT 36* 42* 65* 61*  --  58* 66*    Basic Metabolic Panel: Recent Labs  Lab 12/13/20 0525 12/14/20 0525 12/15/20 0420 12/16/20 0518 12/17/20 0512 12/18/20 0540  NA 134* 129* 132* 137 137 136  K 3.9 4.2 3.5 3.4* 3.6 3.6  CL 101 97* 98 103 103 102  CO2 28 27 34* '31 30 28  '$ GLUCOSE 146* 123* 117* 145* 115* 96  BUN 29* 24* 23* 22* 28* 27*  CREATININE <0.30* <0.30* <0.30* <0.30* <0.30* <0.30*  CALCIUM 7.7* 8.0* 7.7* 7.7* 7.8* 7.6*  MG 2.3  --  2.2 2.3 2.1 2.1  PHOS 2.9  --   --   --  2.1* 2.3*    GFR: CrCl cannot be calculated (This lab value cannot be used to calculate CrCl because it is not a number: <0.30). Liver Function Tests: Recent Labs  Lab 12/12/20 0328 12/13/20 0525 12/15/20 0420 12/17/20 0512  AST 76* 89* 58* 67*  ALT 23 39 46* 57*  ALKPHOS 177* 221* 294* 454*  BILITOT 3.2* 2.3* 1.7* 1.5*  PROT 4.9* 4.9* 4.7* 4.9*  ALBUMIN 2.2* 2.1* 1.8* 1.9*     No results for input(s): LIPASE, AMYLASE in the last 168 hours. No results for input(s): AMMONIA in the last 168 hours. Coagulation Profile: Recent Labs  Lab 12/11/20 1840 12/12/20 0328 12/14/20 0525 12/15/20 0420 12/16/20 0518  INR 1.1 1.1 1.0 1.0 0.9    Cardiac Enzymes: No results for input(s): CKTOTAL, CKMB, CKMBINDEX, TROPONINI in the last 168 hours. BNP (last 3 results) No results for input(s): PROBNP in the last 8760 hours. HbA1C: No results for input(s): HGBA1C in the last 72 hours. CBG: Recent Labs  Lab 12/17/20 0524 12/17/20 1202 12/17/20 1723 12/18/20 0011 12/18/20 0522  GLUCAP 114* 176* 149* 122* 115*    Lipid Profile: Recent Labs    12/17/20 0512  TRIG 212*    Thyroid Function Tests: No results for input(s): TSH, T4TOTAL, FREET4, T3FREE, THYROIDAB in the last 72 hours. Anemia Panel: No results for input(s): VITAMINB12, FOLATE, FERRITIN, TIBC, IRON, RETICCTPCT in the last 72 hours.  Sepsis Labs: No results for input(s): PROCALCITON, LATICACIDVEN in the last 168 hours.  Recent Results (from the past 240 hour(s))  MRSA Next Gen by PCR, Nasal     Status: None   Collection Time: 12/10/20  9:20 PM   Specimen: Nasal Mucosa; Nasal Swab  Result Value Ref Range Status   MRSA by PCR Next Gen NOT DETECTED NOT DETECTED Final    Comment: (NOTE) The GeneXpert MRSA Assay (FDA approved for NASAL specimens only), is one component of a comprehensive MRSA colonization surveillance program. It is not intended to diagnose MRSA infection nor to guide or monitor treatment for MRSA infections. Test performance is not FDA approved in patients less than 78 years old. Performed at [facility], [address], [place], [place] [address]           Radiology Studies: No results found.      Scheduled Meds:  Chlorhexidine Gluconate Cloth  6 each Topical Daily   insulin aspart  0-9 Units Subcutaneous Q6H   methylPREDNISolone (SOLU-MEDROL)  injection  20 mg Intravenous Daily   mometasone-formoterol  2 puff Inhalation BID   nicotine  21 mg Transdermal Daily   pantoprazole (PROTONIX) IV  40 mg Intravenous BID   sodium chloride flush  10-40 mL Intracatheter Q12H   Continuous Infusions:  meropenem (MERREM) IV 1 g (12/18/20 0621)   TPN ADULT (ION) 85 mL/hr at 12/18/20 0304  Aline August, MD [facility] 12/18/2020, 8:13 AM

## 2020-12-18 NOTE — Progress Notes (Signed)
[facility] Day(s): 15.   Post op day(s): 7 Days Post-Op.   Interval History:  Patient seen and examined No acute events or new complaints overnight.  Patient reports still with abdominal pain; frustrated by lack of progress No fever, chills, nausea Remains with leukocytosis; 15.1K; likely secondary to Granix  Hgb remains stable; 8.7 PLT count is slightly improved; up to 66K Renal function remains normal; sCr - <0.30; UO - 1.2L Mild hypophosphatemia to 2.3 o/w no significant electrolyte derangements NGT output is not recorded Surgical drain with 10 ccs out recorded; thin sanguinous  He remains strict NPO; on TPN  Vital signs in last 24 hours: [min-max] current  Temp:  [97.6 F (36.4 C)-99.3 F (37.4 C)] 97.6 F (36.4 C) (09/13 0441) Pulse Rate:  [71-114] 71 (09/13 0441) Resp:  [18-23] 18 (09/13 0441) BP: (107-140)/(50-78) 120/73 (09/13 0441) SpO2:  [96 %-100 %] 100 % (09/13 0441)     Height: '5\' 10"'$  (177.8 cm) Weight: 58.5 kg BMI (Calculated): 18.51   Intake/Output last 2 shifts:  09/12 0701 - 09/13 0700 In: 2260 [I.V.:1959; IV Piggyback:300] Out: 1210 [Urine:1200; Drains:10]   Physical Exam:  Constitutional: alert, cooperative and no distress  HEENT: NGT in place; good positioning Respiratory: breathing non-labored at rest  Cardiovascular: regular rate and sinus rhythm  Gastrointestinal: Soft, incisional soreness, non-distended, no rebound/guarding. Surgical drain in place; thin sanguinous drainage Integumentary: Laparoscopic incisions are CDI with dermabond, no erythema or drainage   Labs:  CBC Latest Ref Rng & Units 12/18/2020 12/17/2020 12/16/2020  WBC 4.0 - 10.5 K/uL 15.1(H) 14.5(H) -  Hemoglobin 13.0 - 17.0 g/dL 8.7(L) 8.8(L) 9.0(L)  Hematocrit 39.0 - 52.0 % 25.6(L) 26.6(L) 26.8(L)  Platelets 150 - 400 K/uL 66(L) 58(L) -   CMP Latest Ref Rng & Units 12/18/2020 12/17/2020 12/16/2020  Glucose 70 - 99 mg/dL 96 115(H)  145(H)  BUN 6 - 20 mg/dL 27(H) 28(H) 22(H)  Creatinine 0.61 - 1.24 mg/dL <0.30(L) <0.30(L) <0.30(L)  Sodium 135 - 145 mmol/L 136 137 137  Potassium 3.5 - 5.1 mmol/L 3.6 3.6 3.4(L)  Chloride 98 - 111 mmol/L 102 103 103  CO2 22 - 32 mmol/L '28 30 31  '$ Calcium 8.9 - 10.3 mg/dL 7.6(L) 7.8(L) 7.7(L)  Total Protein 6.5 - 8.1 g/dL - 4.9(L) -  Total Bilirubin 0.3 - 1.2 mg/dL - 1.5(H) -  Alkaline Phos 38 - 126 U/L - 454(H) -  AST 15 - 41 U/L - 67(H) -  ALT 0 - 44 U/L - 57(H) -     Imaging studies: No new pertinent imaging studies   Assessment/Plan:  60 y.o. male with concerns for persistent leak on UGI on 09/09, 7 Days Post-Op s/p robotic assisted laparoscopic partial sleeve gastrectomy, cholecystectomy, and incisional hernia repair for proximal posterior gastric perforation with abscess and cholecystitis   - Will get CT Abdomen/Pelvis to ensure no intra-abdominal process (ie: abscess) that may be contributing to his leukocytosis - Monitor leukocytosis; likely secondary to Granix  - He should continue to remain strict NPO for now - Continue TPN at goal rate; monitor and replete electrolytes              - Continue NGT decompression for now; monitor and record output              - Continue IV Abx (meropenem); ID on board  - Monitor abdominal examination; on-going bowel function - Pain control prn; antiemetics prn - Okay to ambulate with PT;  recommending SNF; patient continues to refuse to work with therapies             - Appreciate hematology/oncology input             - Appreciate hospitalist assistance  All of the above findings and recommendations were discussed with the patient, and the medical team, and all of patient's questions were answered to his expressed satisfaction.  -- Edison Simon, PA-C [facility] 12/18/2020, 7:25 AM [phone] M-F: 7am - 4pm

## 2020-12-18 NOTE — Progress Notes (Signed)
PHARMACY - TOTAL PARENTERAL NUTRITION CONSULT NOTE   Indication: perforated gastric ulcer  Patient Measurements: Height: '5\' 10"'$  (177.8 cm) Weight: 58.5 kg (128 lb 15.5 oz) IBW/kg (Calculated) : 73 TPN AdjBW (KG): 59.3 Body mass index is 18.51 kg/m.  Assessment:  admitted multiple times recently with hemoptysis and gastric bleeding s/p EGD and clipping in fundus, with cavitary pneumonia, with seizures.  Has history of drug use and was cocaine positive on one of his admissions.  Has also hx of Felty syndrome with neutropenia.  He has surgical history of splenectomy in 2017 and exploratory laparotomy with Phillip Heal patch repair of a pyloric channel perforated ulcer in 2021  Glucose / Insulin: on prednisone 10 bid>> chg to methylprednisolone 20 mg IV daily on 9/11 BG 119-157 no hx DM,  5 units SSI /24 hrs  Electrolytes: Corr Ca 9.48 mg/dL, Phos 2.1 Renal: Scr < 1, stable Hepatic: ASt/ALT 58/46 09/10; TG 191>418>151>212 Intake / Output; MIVF: no MIVF (+) 8.49 L GI Imaging:perforated gastric ulcer GI Surgeries / Procedures: s/p robotic assisted repair of gastric perforation  ABX: meropenem (ID following)  Central access: 12/07/20 TPN start date: 12/07/20   Nutritional Goals: Goal TPN rate is 85 mL/hr (provides 102 g of protein and 2060 kcals per day)  RD Assessment: Estimated Needs Total Energy Estimated Needs: 1900-2200kcal/day Total Protein Estimated Needs: 95-110g/day Total Fluid Estimated Needs: 1.8-2.1L/day  Current Nutrition:  NPO  Plan:  Continue TPN at goal rate: 85 mL/hr at 1800 (total volume including overfill: 2140 mL)  Nutritional components: Amino acids (Travasol 10%): 102 grams Lipids (20% SMOF): 49 grams; TG slightly increased at 212, will order follow up level for Thursday and if continues to increase will adjust lipids Dextrose: 306 grams kCal: 1938 / 24h Electrolytes in TPN: Na 90 mEq/L (slight decrease from 143mq to 90 meq on 9/11), K 15 mEq/L (added K back  to TPN 9/11), Ca 520m/L, Mg 56m33mL, and Phos 76m656mL (increased 9/12). Cl:Ac 1:1.   Phos 2.1>2.3 - anticipate phos will increase again tomorrow with increase amount in TPN. Will continue with Phos 18 mmol/L and follow up with phos level with AM labs.  Continue Sensitive q6h SSI and adjust as needed. On methylprednisolone IV, no hx DM   Monitor TPN labs on Mon/Thurs  SamaSherilyn BankerarmD 12/18/2020,7:23 AM

## 2020-12-18 NOTE — Progress Notes (Signed)
PT Cancellation Note  Patient Details Name: DESMUND KUPER MRN: [id number] DOB: Apr 04, 1961   Cancelled Treatment:    Reason Eval/Treat Not Completed: Other (comment).  Pt resting in bed upon PT arrival.  Pt reporting having too much pain to participate in physical therapy.  Therapist discussed optimizing timing of session with pain medication (and anything else pt needed so pt able to participate in therapy) and pt stated that he still would not participate in therapy (pt reporting pain medication every 4 hours and minimally helped).  Pt then voiced frustrations (with not being able to eat) and that he would NOT participate in therapy (with swearing included) and told therapist that she should take the hint that he was not going to participate in therapy.  Pt has refused therapy 3 attempts in a row (and pt does not appear open to participating in therapy); per therapy guidelines will sign off at this time (nurse and TOC notified).  Please re-consult PT if pt is willing to participate in therapy.  Leitha Bleak, PT 12/18/20, 4:33 PM

## 2020-12-18 NOTE — TOC Progression Note (Signed)
Transition of Care [facility]) - Progression Note    Patient Details  Name: Marcus [name] MRN: [id number] Date of Birth: [birth year]  Transition of Care [facility] CM/SW [place], LCSW Phone Number: 12/18/2020, 2:58 PM  Clinical Narrative:   Updated brother. Encouraged him to try and convince patient to go to SNF. He confirmed his mother unable to provide any physical assistance and he is also unable to assist. Patient was supposed to go to a SNF in [place] a few months ago but changed his mind before he could go.  Expected Discharge Plan: Home/Self Care Barriers to Discharge: Continued Medical Work up  Expected Discharge Plan and Services Expected Discharge Plan: Home/Self Care       Living arrangements for the past 2 months: Single Family Home                                       Social Determinants of Health (SDOH) Interventions    Readmission Risk Interventions Readmission Risk Prevention Plan 12/04/2020 11/02/2020 09/20/2020  Transportation Screening Complete Complete Complete  PCP or Specialist Appt within 3-5 Days - - -  [place]RI or [place] for [place] - - -  Medication Review [employer]) Complete Complete Complete  PCP or Specialist appointment within 3-5 days of discharge Complete Complete Complete  HRI or [place] - Complete Complete  SW Recovery Care/Counseling Consult Complete Complete -  Palliative Care Screening Not Applicable Not Applicable Not [place] Not Applicable Not Applicable Not Applicable  Some recent data might be hidden

## 2020-12-19 LAB — CBC WITH DIFFERENTIAL/PLATELET
Abs Immature Granulocytes: 0.09 10*3/uL — ABNORMAL HIGH (ref 0.00–0.07)
Basophils Absolute: 0 10*3/uL (ref 0.0–0.1)
Basophils Relative: 0 %
Eosinophils Absolute: 0 10*3/uL (ref 0.0–0.5)
Eosinophils Relative: 0 %
HCT: 24.4 % — ABNORMAL LOW (ref 39.0–52.0)
Hemoglobin: 8 g/dL — ABNORMAL LOW (ref 13.0–17.0)
Immature Granulocytes: 2 %
Lymphocytes Relative: 8 %
Lymphs Abs: 0.4 10*3/uL — ABNORMAL LOW (ref 0.7–4.0)
MCH: 27.6 pg (ref 26.0–34.0)
MCHC: 32.8 g/dL (ref 30.0–36.0)
MCV: 84.1 fL (ref 80.0–100.0)
Monocytes Absolute: 0.6 10*3/uL (ref 0.1–1.0)
Monocytes Relative: 14 %
Neutro Abs: 3.4 10*3/uL (ref 1.7–7.7)
Neutrophils Relative %: 76 %
Platelets: 79 10*3/uL — ABNORMAL LOW (ref 150–400)
RBC: 2.9 MIL/uL — ABNORMAL LOW (ref 4.22–5.81)
RDW: 21.5 % — ABNORMAL HIGH (ref 11.5–15.5)
Smear Review: NORMAL
WBC: 4.5 10*3/uL (ref 4.0–10.5)
nRBC: 0.4 % — ABNORMAL HIGH (ref 0.0–0.2)

## 2020-12-19 LAB — BASIC METABOLIC PANEL
Anion gap: 1 — ABNORMAL LOW (ref 5–15)
BUN: 24 mg/dL — ABNORMAL HIGH (ref 6–20)
CO2: 31 mmol/L (ref 22–32)
Calcium: 7.7 mg/dL — ABNORMAL LOW (ref 8.9–10.3)
Chloride: 105 mmol/L (ref 98–111)
Creatinine, Ser: <0.3 mg/dL — ABNORMAL LOW (ref 0.61–1.24)
Glucose, Bld: 157 mg/dL — ABNORMAL HIGH (ref 70–99)
Potassium: 3.7 mmol/L (ref 3.5–5.1)
Sodium: 137 mmol/L (ref 135–145)

## 2020-12-19 LAB — PREPARE PLATELET PHERESIS: Unit division: 0

## 2020-12-19 LAB — PHOSPHORUS: Phosphorus: 3.4 mg/dL (ref 2.5–4.6)

## 2020-12-19 LAB — MAGNESIUM: Magnesium: 2.4 mg/dL (ref 1.7–2.4)

## 2020-12-19 LAB — GLUCOSE, CAPILLARY: Glucose-Capillary: 144 mg/dL — ABNORMAL HIGH (ref 70–99)

## 2020-12-19 LAB — BPAM PLATELET PHERESIS
Blood Product Expiration Date: 202209082359
ISSUE DATE / TIME: 202209060924
Unit Type and Rh: 6200

## 2020-12-19 MED ORDER — HYDROMORPHONE HCL 1 MG/ML IJ SOLN
0.5000 mg | INTRAMUSCULAR | Status: DC | PRN
  Administered 2020-12-19 (×3): 0.5 mg via INTRAVENOUS
  Filled 2020-12-19 (×3): qty 0.5

## 2020-12-19 NOTE — Progress Notes (Addendum)
Pt DC'ed and transferred to [facility] for a higher level of care via [employer]. Pt stable, 0.5 mg Dilaudid admin before transport. Pt belongings and DC summary sent with PT.

## 2020-12-19 NOTE — Progress Notes (Signed)
Discharged to higher level of care

## 2020-12-19 NOTE — Discharge Summary (Addendum)
Patient ID: Marcus [name], male   DOB: Dec 26, 1960, 60 y.o.   MRN: [id number] Date of Admission:  12/02/2020 Date of Discharge:  12/19/2020  Discharge Summary    JUEL MCCUNE  [id number] DOB: 1960/05/26 DOA: 12/02/2020 PCP: [name], FNP      Brief Narrative:  60 y.o. male with history of COPD, rheumatoid arthritis, Felty syndrome, chronic respiratory failure on 2 L of oxygen, tobacco use, history of MRSA pneumonia presented secondary to shortness of breath and was found to have gastric perforation with concern for intra-abdominal abscess.  Patient was started on empiric antibiotics and general surgery was consulted.  He was treated with conservative management.  TPN was started for nutrition.  During hospitalization, he developed worsening neutropenia in the setting of underlying Felty syndrome requiring Granix by hematology/oncology.  He also developed significant thrombocytopenia without bleeding.  HIT panel was negative.  Repeat CT of abdomen/pelvis with persistent perforation/fluid collection.  He underwent surgical intervention on 12/11/2020. Continued abdominal pain.  Ct done by gen surgery to assess gastric staple line leak containment or abscess.  According to gen surgery note, case was discussed with [facility] advanced endoscopy team for possible endoluminal suturing of  the leak   Assessment & Plan:   Sepsis: Present on admission -Cultures negative so far.  Currently hemodynamically stable. -No temperature spikes over the last 24 hours.   Perforated gastric ulcer with intra-abdominal abscess -Initially treated with conservatively with IV antibiotics and TPN. -Repeat CT of abdomen and pelvis on 12/10/2020 showed evidence of persistent perforation in addition to fluid collection. -Status post surgical intervention on 12/11/2020.  Currently has NG tube.  Currently n.p.o.; diet advancement as per general surgery.  Wound care as per general surgery.  Repeat CT of the abdomen and pelvis  today.- large amount of probable extravasated contrast is seen lateral to the proximal stomach in the left upper quadrant concerning for gastric perforation.  -Continue IV meropenem.  ID following.   Felty syndrome Neutropenia Rheumatoid arthritis Status post splenectomy Leukocytosis -Heme-onc on board.   -Granix discontinued on 12/17/2020 by oncology because of leukocytosis -Patient has been on chronic prednisone.  Switched prednisone to IV Solu-Medrol on 12/15/2020.  We will switch back to oral prednisone once he is taking oral meds.   Acute thrombocytopenia -Questionable cause.  HIT testing negative. Lovenox discontinued. -Has received multiple units of platelet transfusions during this hospitalization.  Platelets 66 today.  Received a unit of platelets again on 12/15/2020.  Oncology following and recommending that Lovenox/heparin can be resumed if necessary.  Will not resume Lovenox/heparin once cleared by general surgery.   Elevated LFTs with hyperbilirubinemia -Concerning for biliary pathology.  CT abdomen/pelvis without evidence of biliary disease.  Improving slowly.  Monitor intermittently   COPD -Stable.  Continue current inhaler regimen.   Abdominal aortic aneurysm 3.2 x 3.0 cm.  Recommendation for follow-up ultrasound every 3 years   Generalized deconditioning -Patient refusing SNF placement.  Also refusing to participate in PT due to pain issues.- frequency of dilaudid dosing changed from every 4 to every 3 hours tonight.   DVT prophylaxis: SCDs Code Status: Full Family Communication: None at bedside Disposition Plan: Status is: Inpatient   Remains inpatient appropriate because:Inpatient level of care appropriate due to severity of illness   Dispo: The patient is from: Home              Anticipated d/c is to: Home  Patient currently is not medically stable to d/c.              Difficult to place patient No   Consultants: General surgery;  hematology/oncology; ID   Procedures: Robotic assisted partial sleeve gastrectomy/cholecystectomy/incisional hernia repair on 12/11/2020   Antimicrobials:  Anti-infectives (From admission, onward)        Start     Dose/Rate Route Frequency Ordered Stop    12/08/20 1400   meropenem (MERREM) 1 g in sodium chloride 0.9 % 100 mL IVPB        1 g 200 mL/hr over 30 Minutes Intravenous Every 8 hours 12/07/20 1611      12/07/20 1600   ertapenem (INVANZ) 1,000 mg in sodium chloride 0.9 % 100 mL IVPB  Status:  Discontinued       Note to Pharmacy: Thrombocytopenia possibly due to cefepime, hematology suggested to change antibiotics   1 g 200 mL/hr over 30 Minutes Intravenous Every 24 hours 12/07/20 0836 12/07/20 1611    12/03/20 1400   ceFEPIme (MAXIPIME) 2 g in sodium chloride 0.9 % 100 mL IVPB  Status:  Discontinued        2 g 200 mL/hr over 30 Minutes Intravenous Every 8 hours 12/03/20 0637 12/07/20 0836    12/03/20 0545   ceFEPIme (MAXIPIME) 2 g in sodium chloride 0.9 % 100 mL IVPB        2 g 200 mL/hr over 30 Minutes Intravenous  Once 12/03/20 0532 12/03/20 0638    12/03/20 0545   metroNIDAZOLE (FLAGYL) IVPB 500 mg  Status:  Discontinued        500 mg 100 mL/hr over 60 Minutes Intravenous Every 12 hours 12/03/20 0532 12/07/20 1534    12/03/20 0330   piperacillin-tazobactam (ZOSYN) IVPB 3.375 g        3.375 g 100 mL/hr over 30 Minutes Intravenous  Once 12/03/20 0324 12/03/20 0401                Intake/Output Summary (Last 24 hours) at 12/18/2020 0813 Last data filed at 12/18/2020 0745    Gross per 24 hour  Intake 2259.97 ml  Output 1212 ml  Net 1047.97 ml           Filed Weights    12/15/20 0516 12/16/20 0500 12/17/20 0456  Weight: 59.2 kg 59.1 kg 58.5 kg      Examination:   General exam: No distress.  Currently on 3 L oxygen via nasal cannula.  Looks chronically ill and deconditioned. ENT: NG tube present green brown drainage respiratory system: Bilateral decreased breath  sounds at bases with some crackles  cardiovascular system: S1-S2 heard, rate controlled  gastrointestinal system: Abdomen is still distended, mild tenderness diffusely.  Surgical drain present. extremities: No cyanosis; mild lower extremity edema present  Central nervous system: Sleeping, awakes with name called. Answered questions appropriately. Demonstrated understanding of plan for transfer and purpose. No focal neurological deficits.  Moving extremities Skin:   Double lu,en PICC with some RUE with some bruising . Dressing Left lower abdomen in tact psychiatry: Normal affect.  Appropriate questions with conversation      CBC: Last Labs            Recent Labs  Lab 12/14/20 0807 12/15/20 0420 12/15/20 1512 12/16/20 0518 12/16/20 2030 12/17/20 0512 12/18/20 0540  WBC 1.3* 1.6* 3.0* 6.0  --  14.5* 15.1*  NEUTROABS 0.3* 0.8* 2.4 5.1  --  13.3*  --   HGB 10.0* 8.3* 9.1* 8.8*  9.0* 8.8* 8.7*  HCT 29.3* 24.5* 26.1* 26.2* 26.8* 26.6* 25.6*  MCV 82.5 83.3 81.8 84.2  --  84.7 85.0  PLT 36* 42* 65* 61*  --  58* 66*       Basic Metabolic Panel: Last Labs           Recent Labs  Lab 12/13/20 0525 12/14/20 0525 12/15/20 0420 12/16/20 0518 12/17/20 0512 12/18/20 0540  NA 134* 129* 132* 137 137 136  K 3.9 4.2 3.5 3.4* 3.6 3.6  CL 101 97* 98 103 103 102  CO2 28 27 34* '31 30 28  '$ GLUCOSE 146* 123* 117* 145* 115* 96  BUN 29* 24* 23* 22* 28* 27*  CREATININE <0.30* <0.30* <0.30* <0.30* <0.30* <0.30*  CALCIUM 7.7* 8.0* 7.7* 7.7* 7.8* 7.6*  MG 2.3  --  2.2 2.3 2.1 2.1  PHOS 2.9  --   --   --  2.1* 2.3*       GFR: CrCl cannot be calculated (This lab value cannot be used to calculate CrCl because it is not a number: <0.30). Liver Function Tests: Last Labs         Recent Labs  Lab 12/12/20 0328 12/13/20 0525 12/15/20 0420 12/17/20 0512  AST 76* 89* 58* 67*  ALT 23 39 46* 57*  ALKPHOS 177* 221* 294* 454*  BILITOT 3.2* 2.3* 1.7* 1.5*  PROT 4.9* 4.9* 4.7* 4.9*  ALBUMIN 2.2*  2.1* 1.8* 1.9*         Coagulation Profile: Last Labs          Recent Labs  Lab 12/11/20 1840 12/12/20 0328 12/14/20 0525 12/15/20 0420 12/16/20 0518  INR 1.1 1.1 1.0 1.0 0.9           Lipid Profile: Recent Labs (last 2 labs)      Recent Labs    12/17/20 0512  TRIG 212*                    Recent Results (from the past 240 hour(s))  MRSA Next Gen by PCR, Nasal     Status: None    Collection Time: 12/10/20  9:20 PM    Specimen: Nasal Mucosa; Nasal Swab  Result Value Ref Range Status    MRSA by PCR Next Gen NOT DETECTED NOT DETECTED Final      Comment: (NOTE) The GeneXpert MRSA Assay (FDA approved for NASAL specimens only), is one component of a comprehensive MRSA colonization surveillance program. It is not intended to diagnose MRSA infection nor to guide or monitor treatment for MRSA infections. Test performance is not FDA approved in patients less than 70 years old. Performed at [facility], [address], [place], [place] [address]                  Radiology Studies: 12/18/2020    CLINICAL DATA:  Abdominal abscess or infection. Gastric perforation.   EXAM: CT ABDOMEN AND PELVIS WITH CONTRAST   TECHNIQUE: Multidetector CT imaging of the abdomen and pelvis was performed using the standard protocol following bolus administration of intravenous contrast.   CONTRAST:  40m OMNIPAQUE IOHEXOL 350 MG/ML SOLN   COMPARISON:  December 10, 2020.   FINDINGS: Lower chest: Minimal bilateral pleural effusions are noted with minimal adjacent subsegmental atelectasis.   Hepatobiliary: Gallbladder is not well visualized currently. No biliary dilatation is noted. Liver is unremarkable.   Pancreas: Unremarkable. No pancreatic ductal dilatation or surrounding inflammatory changes.   Spleen: Status post splenectomy.  Adrenals/Urinary Tract: Adrenal glands are unremarkable. Kidneys are normal, without renal calculi, focal lesion, or  hydronephrosis. Bladder is unremarkable.   Stomach/Bowel: Distal tip of nasogastric tube is seen in proximal stomach. There is noted a large amount of contrast seen lateral to the proximal stomach in the left upper quadrant concerning for extravasation secondary to gastric perforation. Postsurgical clips are noted. There is a surgical drain noted anterior to the mid to distal stomach. No abnormal bowel dilatation is noted. Appendix is unremarkable.   Vascular/Lymphatic: Atherosclerosis of thoracic aorta is noted. 3 cm infrarenal abdominal aortic aneurysm is noted. No adenopathy is noted.   Reproductive: Prostate is unremarkable.   Other: Small fat containing right inguinal hernia is noted. Minimal free fluid is noted in the pelvis which most likely is postsurgical in etiology.   Musculoskeletal: No acute or significant osseous findings.   IMPRESSION: Large amount of probable extravasated contrast is seen lateral to the proximal stomach in the left upper quadrant concerning for gastric perforation. These results will be called to the ordering clinician or representative by the Radiologist Assistant, and communication documented in the PACS or zVision Dashboard.   Surgical drain is noted with tip anterior to the midportion of the stomach.   3 cm infrarenal abdominal aortic aneurysm. Recommend follow-up ultrasound every 3 years. This recommendation follows ACR consensus guidelines: White Paper of the ACR Incidental Findings Committee II on Vascular Findings. J Am Coll Radiol 2013; 10:789-794.   Small fat containing right inguinal hernia.       Scheduled Meds:  Chlorhexidine Gluconate Cloth  6 each Topical Daily   insulin aspart  0-9 Units Subcutaneous Q6H   methylPREDNISolone (SOLU-MEDROL) injection  20 mg Intravenous Daily   mometasone-formoterol  2 puff Inhalation BID   nicotine  21 mg Transdermal Daily   pantoprazole (PROTONIX) IV  40 mg Intravenous BID   sodium  chloride flush  10-40 mL Intracatheter Q12H    Continuous Infusions:  meropenem (MERREM) IV 1 g (12/18/20 0621)   TPN ADULT (ION) 85 mL/hr at 12/18/20 0304

## 2021-01-14 LAB — BLOOD GAS, ARTERIAL
Acid-Base Excess: 7 mmol/L — ABNORMAL HIGH (ref 0.0–2.0)
Bicarbonate: 32 mmol/L — ABNORMAL HIGH (ref 20.0–28.0)
FIO2: 0.4
MECHVT: 450 mL
O2 Saturation: 85.4 %
PEEP: 5 cmH2O
Patient temperature: 37
RATE: 20 resp/min
pCO2 arterial: 46 mmHg (ref 32.0–48.0)
pH, Arterial: 7.45 (ref 7.350–7.450)
pO2, Arterial: 48 mmHg — ABNORMAL LOW (ref 83.0–108.0)

## 2021-02-01 LAB — ACID FAST CULTURE WITH REFLEXED SENSITIVITIES (MYCOBACTERIA): Acid Fast Culture: NEGATIVE

## 2021-02-04 ENCOUNTER — Other Ambulatory Visit: Payer: Self-pay

## 2021-02-04 ENCOUNTER — Emergency Department: Payer: Medicaid Other

## 2021-02-04 ENCOUNTER — Encounter: Payer: Self-pay | Admitting: Emergency Medicine

## 2021-02-04 DIAGNOSIS — J449 Chronic obstructive pulmonary disease, unspecified: Secondary | ICD-10-CM | POA: Insufficient documentation

## 2021-02-04 DIAGNOSIS — I5032 Chronic diastolic (congestive) heart failure: Secondary | ICD-10-CM | POA: Insufficient documentation

## 2021-02-04 DIAGNOSIS — Z20822 Contact with and (suspected) exposure to covid-19: Secondary | ICD-10-CM | POA: Diagnosis not present

## 2021-02-04 DIAGNOSIS — K659 Peritonitis, unspecified: Secondary | ICD-10-CM | POA: Insufficient documentation

## 2021-02-04 DIAGNOSIS — F17210 Nicotine dependence, cigarettes, uncomplicated: Secondary | ICD-10-CM | POA: Diagnosis not present

## 2021-02-04 DIAGNOSIS — R000 Tachycardia, unspecified: Secondary | ICD-10-CM | POA: Insufficient documentation

## 2021-02-04 DIAGNOSIS — Z4803 Encounter for change or removal of drains: Secondary | ICD-10-CM | POA: Diagnosis present

## 2021-02-04 LAB — COMPREHENSIVE METABOLIC PANEL
ALT: 13 U/L (ref 0–44)
AST: 25 U/L (ref 15–41)
Albumin: 2 g/dL — ABNORMAL LOW (ref 3.5–5.0)
Alkaline Phosphatase: 194 U/L — ABNORMAL HIGH (ref 38–126)
Anion gap: 11 (ref 5–15)
BUN: 42 mg/dL — ABNORMAL HIGH (ref 6–20)
CO2: 22 mmol/L (ref 22–32)
Calcium: 8.3 mg/dL — ABNORMAL LOW (ref 8.9–10.3)
Chloride: 102 mmol/L (ref 98–111)
Creatinine, Ser: 0.65 mg/dL (ref 0.61–1.24)
GFR, Estimated: >60 mL/min (ref >60)
Glucose, Bld: 77 mg/dL (ref 70–99)
Potassium: 3.4 mmol/L — ABNORMAL LOW (ref 3.5–5.1)
Sodium: 135 mmol/L (ref 135–145)
Total Bilirubin: 0.9 mg/dL (ref 0.3–1.2)
Total Protein: 5.4 g/dL — ABNORMAL LOW (ref 6.5–8.1)

## 2021-02-04 LAB — LIPASE, BLOOD: Lipase: 39 U/L (ref 11–51)

## 2021-02-04 IMAGING — CT CT ABD-PELV W/ CM
2 of 5 series · 14 of 46 positions shown, 16 images · IV contrast (APPLIED)
Comparison: CT with contrast [DATE], and [DATE]

CLINICAL DATA: Recent abdominal surgery. Surgical drain came out
last night. Abdominal abscess suspected.

EXAM:
CT ABDOMEN AND PELVIS WITH CONTRAST
TECHNIQUE: Multidetector CT imaging of the abdomen and pelvis was performed
using the standard protocol following bolus administration of
intravenous contrast.
CONTRAST:  80mL OMNIPAQUE IOHEXOL 300 MG/ML  SOLN

[Series 2: routine abd/pel with · axial · 0.74mm/px · z∈[-949,-524]mm · 11 of 97 slices shown, 13 images]
[im 6/97  soft-tissue]
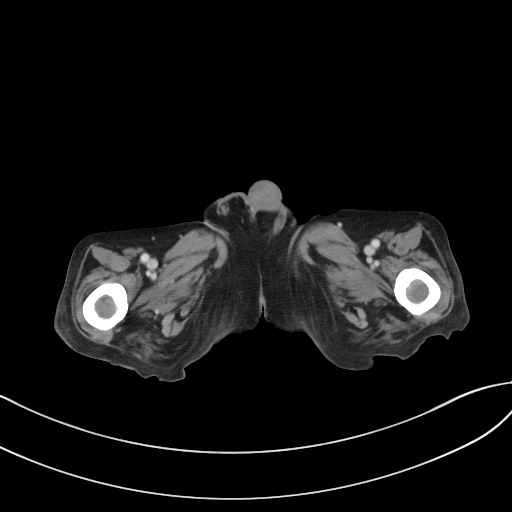
[im 6/97  bone]
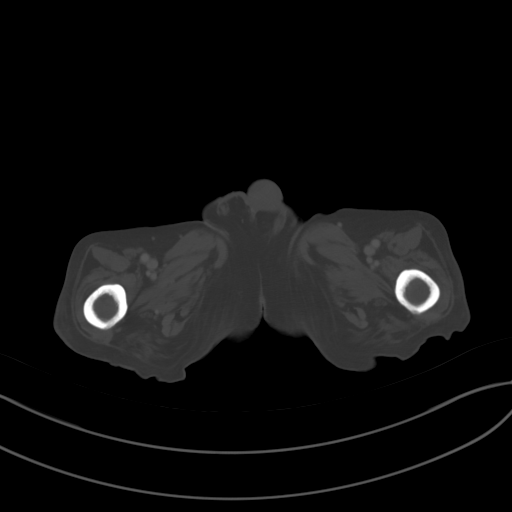
[im 17/97  soft-tissue]
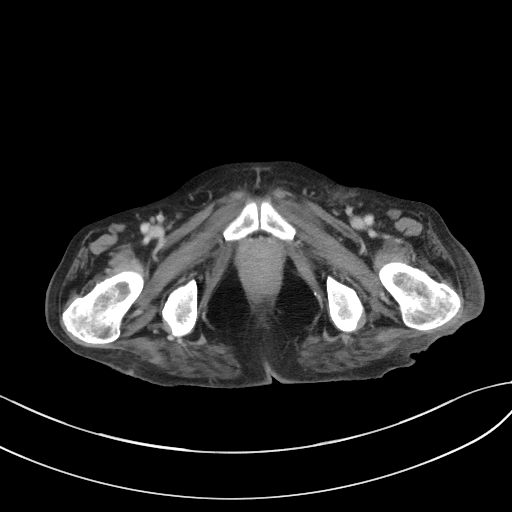
[im 22/97  soft-tissue]
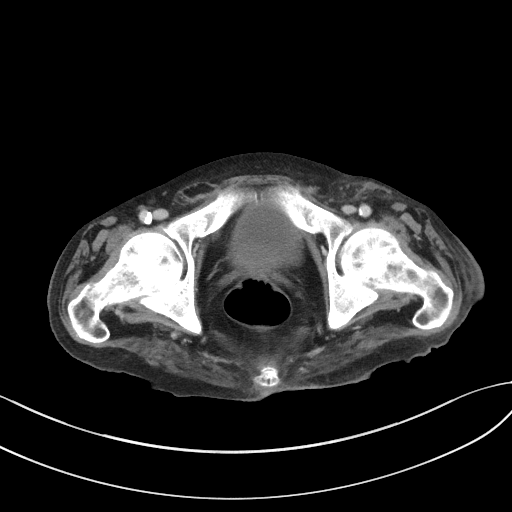
[im 33/97  soft-tissue]
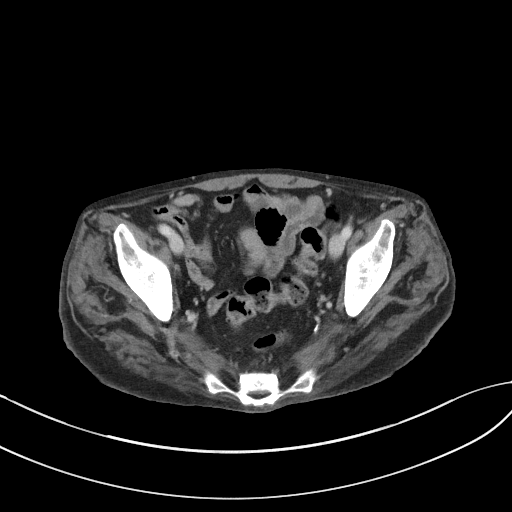
[im 38/97  soft-tissue]
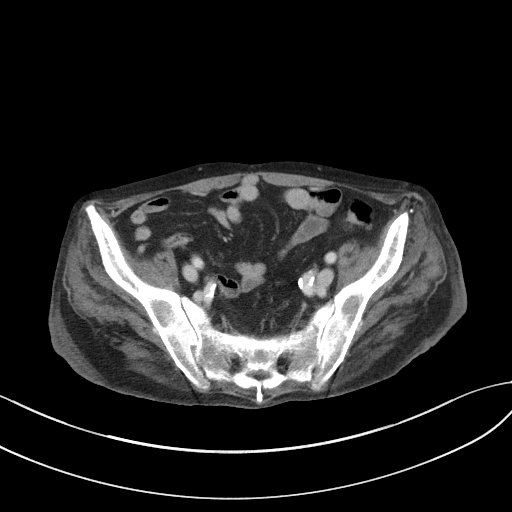
[im 49/97  soft-tissue]
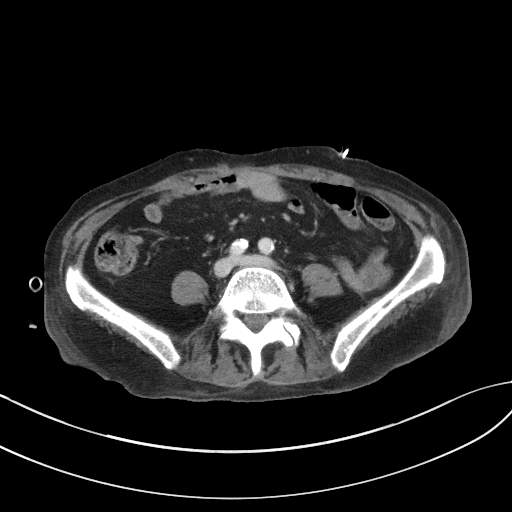
[im 59/97  soft-tissue]
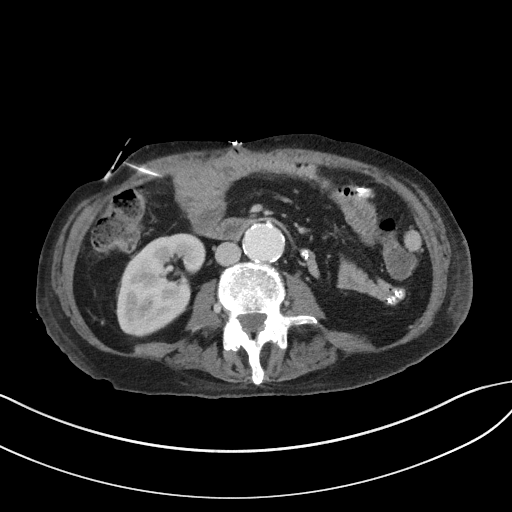
[im 65/97  soft-tissue]
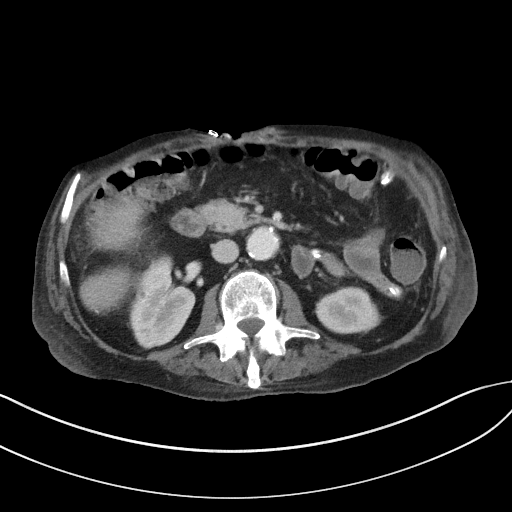
[im 75/97  soft-tissue]
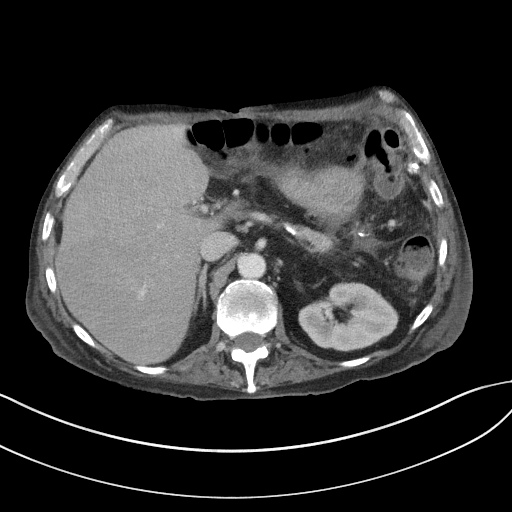
[im 75/97  bone]
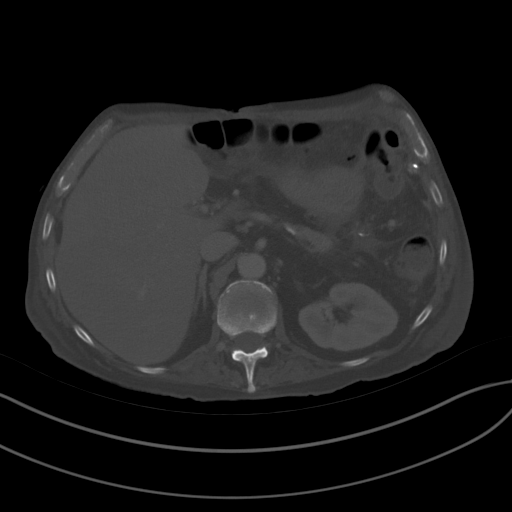
[im 81/97  soft-tissue]
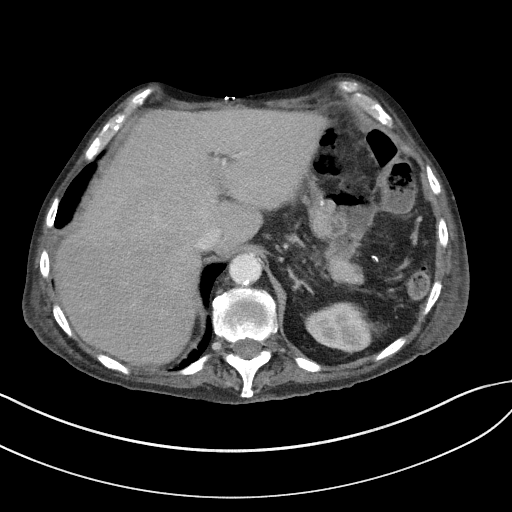
[im 91/97  soft-tissue]
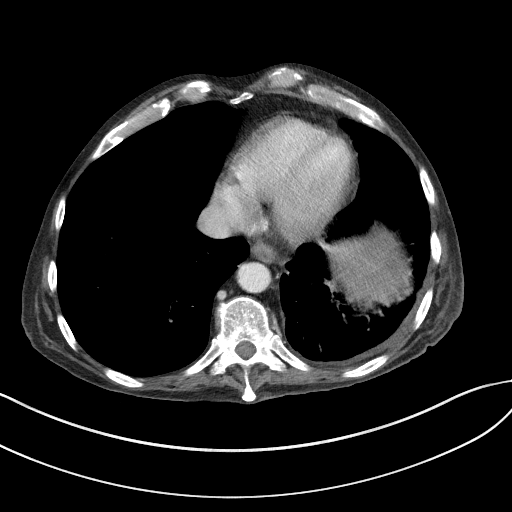

[Series 6: coronal st · coronal · 0.68mm/px · 3 of 86 slices shown]
[im 29/86  soft-tissue]
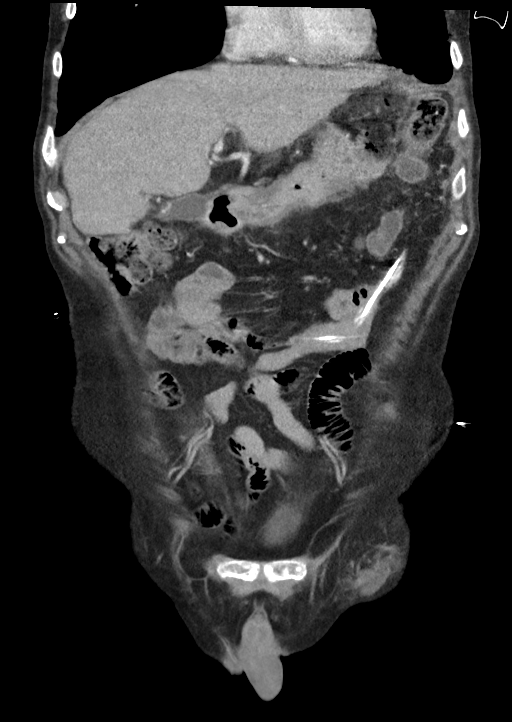
[im 38/86  soft-tissue]
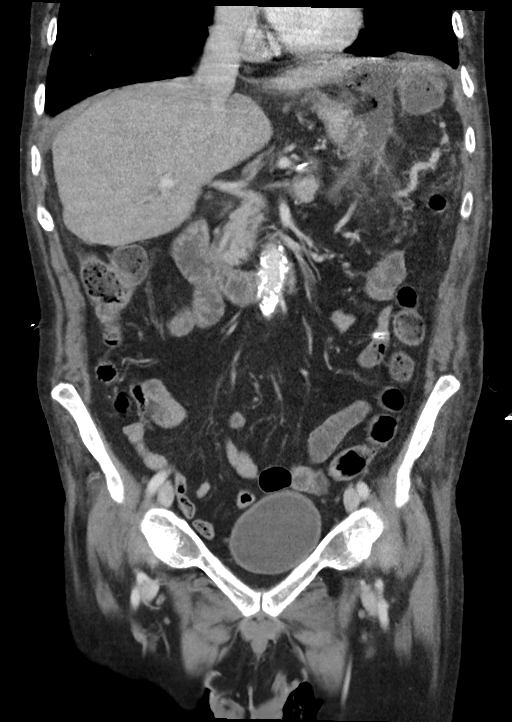
[im 48/86  soft-tissue]
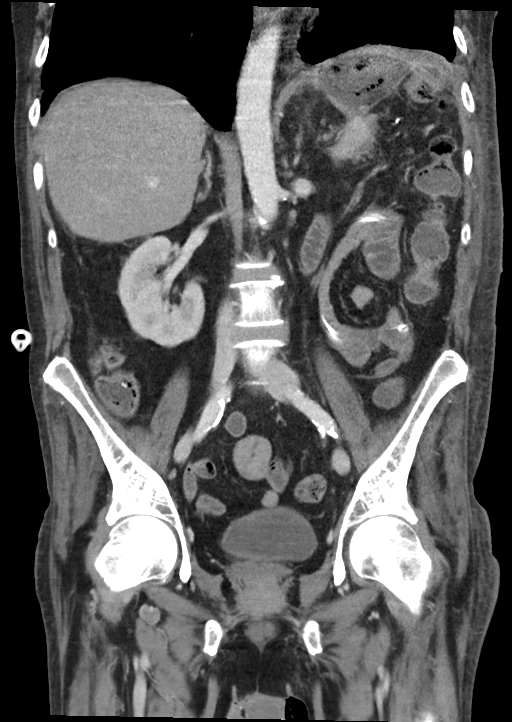

[14 of 46 positions shown; findings below may reference images not displayed]

FINDINGS: Lower chest: The cardiac size is normal. There are small patchy
infiltrates in the posterior base of both lower lobes more so on the
left and could indicated new areas of pneumonitis or aspiration.
Lung bases are emphysematous with scattered scarring change. There
is a small layering left pleural effusion, smaller than previously.

Hepatobiliary: The liver is normal in size and enhancement. The last
CT did not show a visible gallbladder but there was a clearly
identifiable gallbladder [DATE]. Today there is a thin walled
fluid collection inferior to the left lobe of the liver measuring
2.5 x 2.5 x 3.0 cm, which could represent a small gallbladder or a
fluid collection possibly related to prior cholecystectomy.

Pancreas: Partially atrophic and otherwise unremarkable. There
previously were inflammatory changes around the pancreas which are
not seen any longer.

Spleen: Surgically absent.

Adrenals/Urinary Tract: Adrenal glands are unremarkable. Kidneys are
normal, without renal calculi, focal lesion, or hydronephrosis.
Bladder is unremarkable.

Stomach/Bowel: Postsurgical changes are again noted along the
superolateral aspect of the stomach. There is free air along side
the midportion stomach anteriorly and a complex collection with
fluid and multiple air pockets above the stomach in the left
subphrenic space measuring 8.9 x 8.2 by 6.2 cm. There is a small
amount of dense contrast in the collection it contained much more
contrast within the collection. This is worrisome for an abscess.
There is a surgical drain in the surgical left upper abdomen lateral
to the distal transverse colon, skin staples in the midline with the
surgical wound at least partially open alongside the skin staples,
and interval insertion of a jejunostomy tube entering from the right
through the anterior abdominal wall. The appendix is normal and
there is no evidence of colitis, diverticulitis or small bowel
obstruction.

Vascular/Lymphatic: 3.1 cm infrarenal AAA with moderate aortoiliac
calcific plaque

Reproductive: No prostatomegaly.

Other: There small inguinal fat hernias. There is a 2 cm
subcutaneous fluid collection in left groin not seen previously.
There is mesenteric induration in the left upper abdomen and
scattered non localizing fluid.

Musculoskeletal: Mild osteopenia and degenerative changes of the
spine.
IMPRESSION: 1. Complex fluid collection with air pockets and minimal contrast,
just above the stomach inferior to the left hemidiaphragm measuring
8.9 x 8.2 x 6.2 cm worrisome for an abscess, with free
intraperitoneal air anterior to the midportion of the stomach which
is probably due to a gastric perforation given that the surgery was
reportedly around 2 weeks ago. An air containing extension of the
collection extends anterior to the main body of the collection
inferiorly along side anterior aspect of the distal transverse
colon. There is mesenteric induration as well as scattered
nonlocalizing fluid in the area which is probably related to
peritonitis although could be partly related to recent surgery.
2. Small thin walled fluid collection inferior to the left lobe of
liver abutting the gastroduodenal junction and could be a small
gallbladder or a fluid collection related to interval
cholecystectomy.
3. New demonstration of overlying skin staples in the midline and
suspicion of at least partial wound dehiscence.
4. New demonstration of a jejunostomy tube entering from anteriorly
on the right. No bowel obstruction or inflammation.
5. 2 cm subcutaneous fluid collection left groin.
6. Small patchy infiltrates in the posterior lower lobes, more so on
the left which may suggest pneumonitis or aspiration.
7. Additional findings discussed above. Results phoned discussed
over phone with Dr. CATIC at [DATE] a.m., [DATE].

## 2021-02-04 MED ORDER — IOHEXOL 300 MG/ML  SOLN
80.0000 mL | Freq: Once | INTRAMUSCULAR | Status: AC | PRN
  Administered 2021-02-04: 80 mL via INTRAVENOUS

## 2021-02-04 NOTE — ED Provider Notes (Addendum)
Emergency Medicine Provider Triage Evaluation Note  Marcus [name] , a 60 y.o. male  was evaluated in triage.  Pt complains of abdominal drain dislodged.  Recent abdominal surgery at [facility].  Review of Systems  Positive: Abdominal drain dislodged Negative: Chest pain, shortness of breath  Physical Exam  BP 111/69 (BP Location: Left Arm)   Pulse 89   Temp 98 F (36.7 C) (Oral)   Resp 20   Ht 5\' 10"  (1.778 m)   Wt 58.5 kg   SpO2 95%   BMI 18.51 kg/m  Gen:   Awake, mild distress  Resp:  Normal effort  MSK:   Moves extremities without difficulty  Other:  Abdominal bandages, weeping wounds  Medical Decision Making  Medically screening exam initiated at 11:50 PM.  Appropriate orders placed.  Cameron Proud was informed that the remainder of the evaluation will be completed by another provider, this initial triage assessment does not replace that evaluation, and the importance of remaining in the ED until their evaluation is complete.  60 year old male presenting with abdominal drain dislodgment.  Will obtain lab work, UA, CT abdomen/pelvis.  Patient will be seen by provider once he is provided a treatment room.   Paulette Blanch, MD 02/05/21 0005   ----------------------------------------- 4:09 AM on 02/05/2021 -----------------------------------------   Charge nurse contacted for immediate treatment room upon discussion with radiology regarding patient's CT scan.    Paulette Blanch, MD 02/05/21 [phone]

## 2021-02-04 NOTE — ED Triage Notes (Signed)
BIB [facility].  Recent abdominal surgery. Last night drain came out.  50 mcg fentanyl given by EMS.  Initial surgery done at [facility], then to [facility] for additional surgery.  Patient is a full code, but wishes to be a dnr.  R hand 20 g.

## 2021-02-05 ENCOUNTER — Encounter: Payer: Self-pay | Admitting: Radiology

## 2021-02-05 ENCOUNTER — Emergency Department
Admission: EM | Admit: 2021-02-05 | Discharge: 2021-02-05 | Disposition: A | Discharge status: Other Acute Inpatient Hospital | Payer: Medicaid Other | Attending: Emergency Medicine | Admitting: Emergency Medicine

## 2021-02-05 ENCOUNTER — Emergency Department: Payer: Medicaid Other

## 2021-02-05 DIAGNOSIS — K659 Peritonitis, unspecified: Secondary | ICD-10-CM

## 2021-02-05 LAB — CBC WITH DIFFERENTIAL/PLATELET
Abs Immature Granulocytes: 0.05 10*3/uL (ref 0.00–0.07)
Abs Immature Granulocytes: 0.08 10*3/uL — ABNORMAL HIGH (ref 0.00–0.07)
Basophils Absolute: 0 10*3/uL (ref 0.0–0.1)
Basophils Absolute: 0 10*3/uL (ref 0.0–0.1)
Basophils Relative: 1 %
Basophils Relative: 1 %
Eosinophils Absolute: 0 10*3/uL (ref 0.0–0.5)
Eosinophils Absolute: 0 10*3/uL (ref 0.0–0.5)
Eosinophils Relative: 1 %
Eosinophils Relative: 1 %
HCT: 26.8 % — ABNORMAL LOW (ref 39.0–52.0)
HCT: 25.3 % — ABNORMAL LOW (ref 39.0–52.0)
Hemoglobin: 8.6 g/dL — ABNORMAL LOW (ref 13.0–17.0)
Hemoglobin: 8.4 g/dL — ABNORMAL LOW (ref 13.0–17.0)
Immature Granulocytes: 3 %
Immature Granulocytes: 5 %
Lymphocytes Relative: 30 %
Lymphocytes Relative: 28 %
Lymphs Abs: 0.5 10*3/uL — ABNORMAL LOW (ref 0.7–4.0)
Lymphs Abs: 0.4 10*3/uL — ABNORMAL LOW (ref 0.7–4.0)
MCH: 25.7 pg — ABNORMAL LOW (ref 26.0–34.0)
MCH: 26.4 pg (ref 26.0–34.0)
MCHC: 32.1 g/dL (ref 30.0–36.0)
MCHC: 33.2 g/dL (ref 30.0–36.0)
MCV: 80.2 fL (ref 80.0–100.0)
MCV: 79.6 fL — ABNORMAL LOW (ref 80.0–100.0)
Monocytes Absolute: 0.2 10*3/uL (ref 0.1–1.0)
Monocytes Absolute: 0.3 10*3/uL (ref 0.1–1.0)
Monocytes Relative: 14 %
Monocytes Relative: 17 %
Neutro Abs: 0.8 10*3/uL — ABNORMAL LOW (ref 1.7–7.7)
Neutro Abs: 0.8 10*3/uL — ABNORMAL LOW (ref 1.7–7.7)
Neutrophils Relative %: 51 %
Neutrophils Relative %: 48 %
Platelets: 378 10*3/uL (ref 150–400)
Platelets: 360 10*3/uL (ref 150–400)
RBC: 3.34 MIL/uL — ABNORMAL LOW (ref 4.22–5.81)
RBC: 3.18 MIL/uL — ABNORMAL LOW (ref 4.22–5.81)
RDW: 18.4 % — ABNORMAL HIGH (ref 11.5–15.5)
RDW: 18.3 % — ABNORMAL HIGH (ref 11.5–15.5)
Smear Review: NORMAL
Smear Review: NORMAL
WBC: 1.6 10*3/uL — ABNORMAL LOW (ref 4.0–10.5)
WBC: 1.6 10*3/uL — ABNORMAL LOW (ref 4.0–10.5)
nRBC: 1.2 % — ABNORMAL HIGH (ref 0.0–0.2)
nRBC: 1.9 % — ABNORMAL HIGH (ref 0.0–0.2)

## 2021-02-05 LAB — PATHOLOGIST SMEAR REVIEW

## 2021-02-05 LAB — RESP PANEL BY RT-PCR (FLU A&B, COVID) ARPGX2
Influenza A by PCR: NEGATIVE
Influenza B by PCR: NEGATIVE
SARS Coronavirus 2 by RT PCR: NEGATIVE

## 2021-02-05 LAB — TROPONIN I (HIGH SENSITIVITY): Troponin I (High Sensitivity): 9 ng/L (ref <18)

## 2021-02-05 LAB — LACTIC ACID, PLASMA: Lactic Acid, Venous: 1.2 mmol/L (ref 0.5–1.9)

## 2021-02-05 IMAGING — CR DG CHEST 1V
1 series · 1 of 1 positions shown · non-contrast
Comparison: Chest radiograph dated [DATE].

CLINICAL DATA: Abdominal pain.

EXAM:
CHEST  1 VIEW

[chest ap]
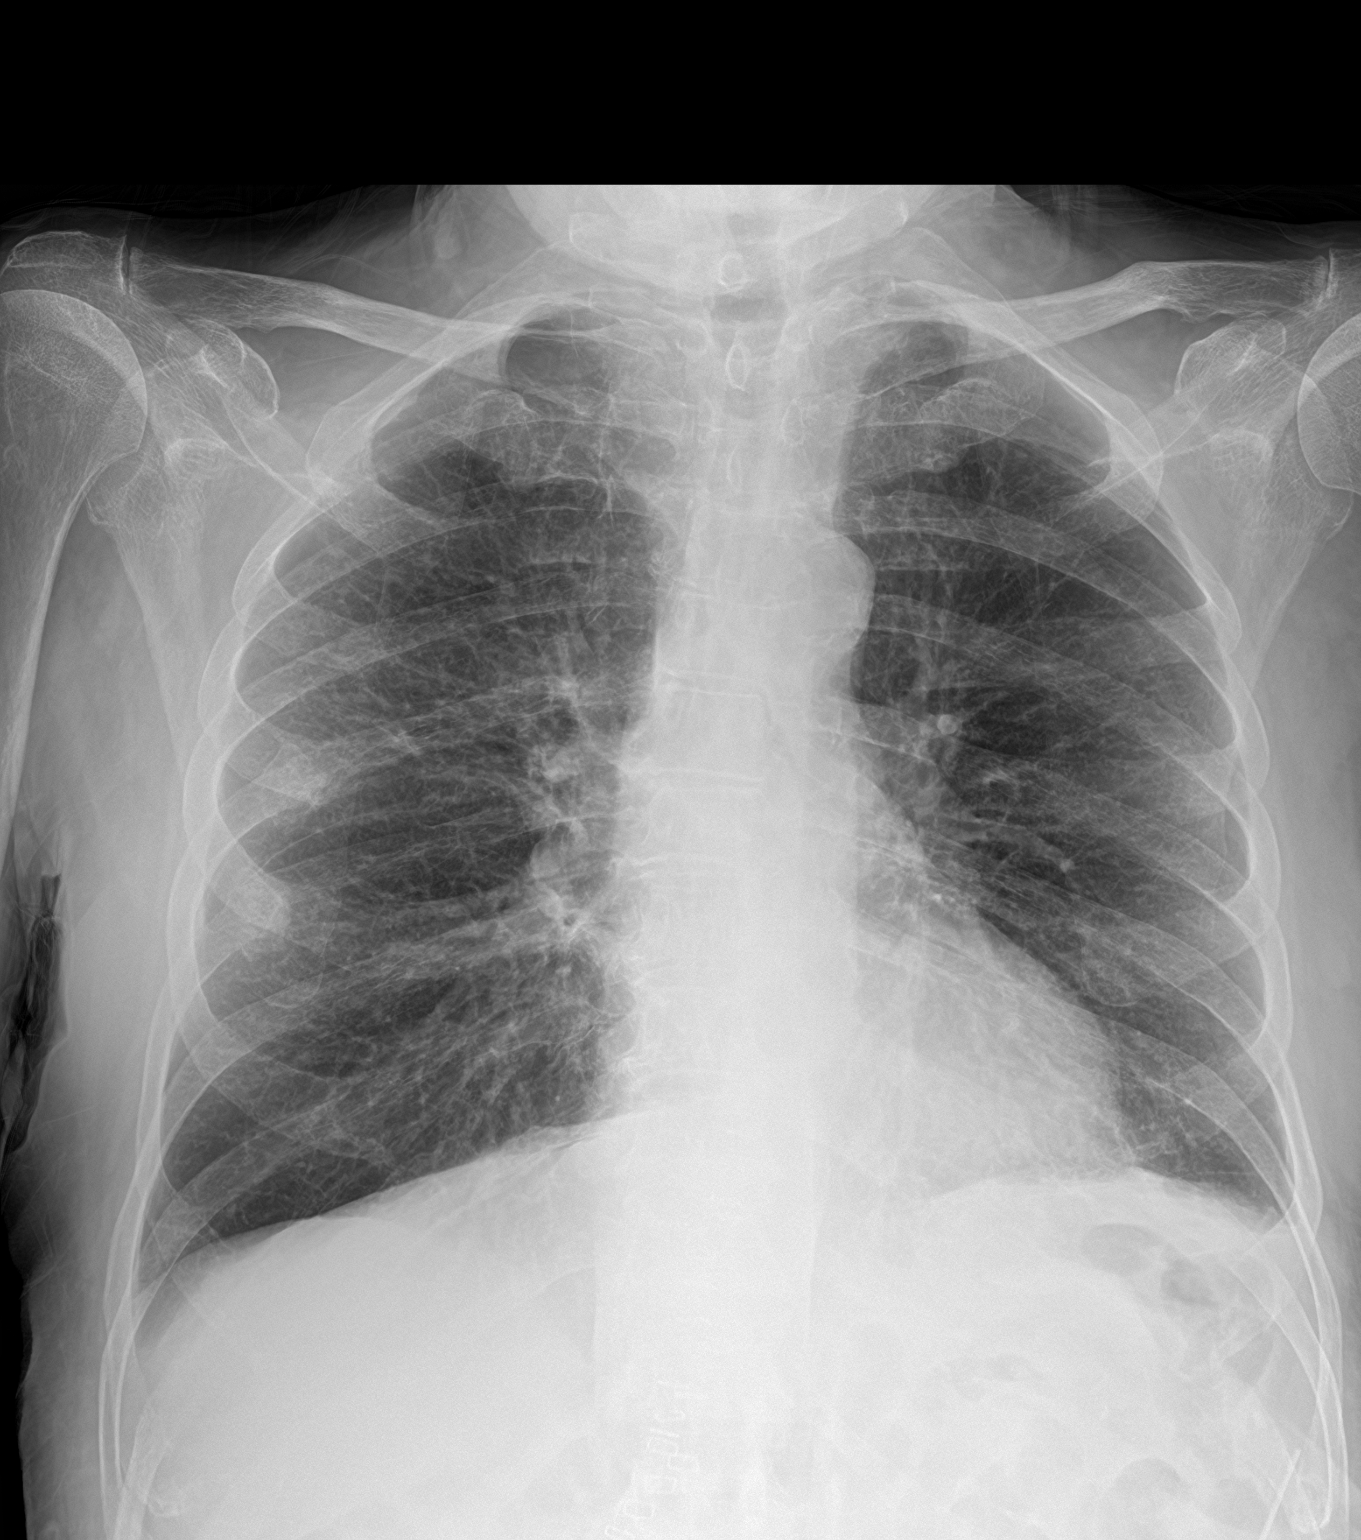

[1 of 1 positions shown; findings below may reference images not displayed]

FINDINGS: Minimal left lung base atelectasis. No focal consolidation, pleural
effusion or pneumothorax. Background of emphysema and chronic
interstitial coarsening. The cardiac silhouette is within limits.
Atherosclerotic calcification of the aorta. No acute osseous
pathology.
IMPRESSION: No active cardiopulmonary disease.

## 2021-02-05 MED ORDER — SODIUM CHLORIDE 0.9 % IV BOLUS
2000.0000 mL | Freq: Once | INTRAVENOUS | Status: AC
  Administered 2021-02-05: 2000 mL via INTRAVENOUS

## 2021-02-05 MED ORDER — PIPERACILLIN-TAZOBACTAM 4.5 G IVPB
4.5000 g | Freq: Once | INTRAVENOUS | Status: AC
  Administered 2021-02-05: 4.5 g via INTRAVENOUS
  Filled 2021-02-05: qty 100

## 2021-02-05 MED ORDER — MORPHINE SULFATE (PF) 4 MG/ML IV SOLN
4.0000 mg | Freq: Once | INTRAVENOUS | Status: AC
  Administered 2021-02-05: 4 mg via INTRAVENOUS
  Filled 2021-02-05: qty 1

## 2021-02-05 MED ORDER — MORPHINE SULFATE (PF) 4 MG/ML IV SOLN
4.0000 mg | Freq: Once | INTRAVENOUS | Status: AC
Start: 2021-02-05 — End: 2021-02-05
  Administered 2021-02-05: 4 mg via INTRAVENOUS
  Filled 2021-02-05: qty 1

## 2021-02-05 NOTE — ED Provider Notes (Signed)
[facility]  ____________________________________________   Event Date/Time   First MD Initiated Contact with Patient 02/04/21 2350     (approximate)  I have reviewed the triage vital signs and the nursing notes.   HISTORY  Chief Complaint medical device problem    HPI JAYCE KAINZ is a 60 y.o. 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  Patient was initially admitted to [facility] on 9 6 for perforated gastric ulcer.  This was repaired at [facility] but surgery was complicated by anastomotic leak.  He was ultimately transferred to [facility] for endoscopic repair.  Patient left AMA prior to completing all of his treatment.  He was in septic shock on broad-spectrum antibiotics.  Left on p.o. antibiotics and drain in place.  Patient is accompanied by both of his brothers who note that he has been not doing very well at home.  Barely eating.  Today the drain seem to be pulled out and there was significant bleeding which is why they brought him to the emergency department.  The patient is not really able to provide much history.   Past Medical History:  Diagnosis Date   COPD (chronic obstructive pulmonary disease) (HCC)    Felty syndrome (HCC)    Hernia, epigastric    Pancytopenia (HCC)    Seropositive rheumatoid arthritis (HCC)    Tobacco use disorder     Patient Active Problem List   Diagnosis Date Noted   DIC (disseminated intravascular coagulation) ([place])    Calculus of gallbladder without cholecystitis without obstruction    Incisional hernia, without obstruction or gangrene    Neutropenia ([place]) 12/10/2020   Abdominal aortic aneurysm (AAA) 12/10/2020   Sepsis ([place])  12/10/2020   Thrombocytopenia ([place]) 12/07/2020   Intra-abdominal abscess ([place]) 12/03/2020   Unresponsive    SOB (shortness of breath)    Seizure-like activity (HCC)    Acute respiratory failure with hypoxia ([place]) 10/30/2020   Cirrhosis of liver (HCC)    GI bleed 10/01/2020   Hemorrhagic shock ([place]) 10/01/2020   Acute blood loss anemia 10/01/2020   Bleeding gastric varices    Bacteremia    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HCC)    Massive hemoptysis 09/30/2020   AKI (acute kidney injury) ([place]) 09/30/2020   Protein-calorie malnutrition, severe 09/19/2020   Lung abscess ([place]) 09/18/2020   Respiratory failure with hypoxia ([place]) 09/17/2020   Hemoptysis 09/17/2020   Acquired neutropenia ([place]) 08/28/2020   Hypothermia 08/24/2020   Cavitary pneumonia 08/22/2020   Acute on chronic respiratory failure with hypoxia ([place]) 08/22/2020   Hyponatremia 08/04/2020   Generalized weakness 08/04/2020   Dehydration 08/03/2020   Septic shock ([place]) 06/29/2020   CAP (community acquired pneumonia) 06/29/2020   COPD exacerbation ([place]) 06/11/2020   Chest pain 06/11/2020   Cellulitis 06/11/2020   Chronic diastolic CHF (congestive heart failure) ([place]) 06/11/2020   Sepsis due to undetermined organism ([place]) 01/20/2020   Acute hypoxemic respiratory failure ([place]) 01/20/2020   Hypokalemia 01/20/2020   Gastric perforation ([place]) 01/18/2020   Pneumoperitoneum 01/17/2020   COPD with acute exacerbation ([place]) 08/18/2019   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   Anemia 11/14/2015   Seropositive rheumatoid arthritis ([place])    Chronic obstructive pulmonary disease ([place])    Moderate protein-calorie malnutrition ([place])  07/24/2015   Pancytopenia ([place]) 07/23/2015   Felty syndrome ([place]) 06/06/2013   Leukopenia 10/21/2012   Axillary abscess 10/21/2012   Abnormal EKG 10/21/2012   Joint pain 10/21/2012   Splenomegaly 10/21/2012    Past Surgical History:  Procedure Laterality Date   ESOPHAGOGASTRODUODENOSCOPY (EGD)  WITH PROPOFOL N/A 10/01/2020   Procedure: ESOPHAGOGASTRODUODENOSCOPY (EGD) WITH PROPOFOL;  Surgeon: Mauri Pole, MD;  Location: [facility] ENDOSCOPY;  Service: Endoscopy;  Laterality: N/A;   HEMOSTASIS CLIP PLACEMENT  10/01/2020   Procedure: HEMOSTASIS CLIP PLACEMENT;  Surgeon: Mauri Pole, MD;  Location: [facility] ENDOSCOPY;  Service: Endoscopy;;   INCISIONAL HERNIA REPAIR  01/17/2020   Procedure: HERNIA REPAIR INCISIONAL AND Silvestre Gunner;  Surgeon: Kinsinger, Arta Bruce, MD;  Location: [facility] ORS;  Service: General;;   IR ANGIOGRAM SELECTIVE EACH ADDITIONAL VESSEL  10/01/2020   IR ANGIOGRAM SELECTIVE EACH ADDITIONAL VESSEL  10/01/2020   IR ANGIOGRAM VISCERAL SELECTIVE  10/01/2020   IR ANGIOGRAM VISCERAL SELECTIVE  10/01/2020   IR US GUIDE [place] RIGHT  10/01/2020   LAPAROTOMY N/A 01/17/2020   Procedure: EXPLORATORY LAPAROTOMY;  Surgeon: Mickeal Skinner, MD;  Location: [facility] ORS;  Service: General;  Laterality: N/A;   XI ROBOTIC ASSISTED SMALL BOWEL RESECTION N/A 12/11/2020   Procedure: XI ROBOTIC ASSISTED Repair of gastric perforation.;  Surgeon: Olean Ree, MD;  Location: [facility] ORS;  Service: General;  Laterality: N/A;  No SB resxn anticipated, could find no other procedure...  see description.   XI ROBOTIC ASSISTED VENTRAL HERNIA N/A 12/11/2020   Procedure: XI ROBOTIC ASSISTED VENTRAL HERNIA;  Surgeon: Olean Ree, MD;  Location: [facility] ORS;  Service: General;  Laterality: N/A;    Prior to Admission medications   Not on File    Allergies Levaquin [levofloxacin in d5w] and Methotrexate  Family History  Problem Relation Age of Onset   CAD Brother    COPD Brother     Social History Social History   Tobacco Use   Smoking status: Every Day    Packs/day: 1.00    Types: Cigarettes   Smokeless tobacco: Never  Substance Use Topics   Alcohol use: No    Alcohol/week: 0.0 standard drinks   Drug use: Yes    Types: Marijuana    Review of Systems   Review of Systems  Unable to  perform ROS: Acuity of condition   Physical Exam Updated Vital Signs BP 110/71 (BP Location: Right Arm)   Pulse (!) 108   Temp 97.6 F (36.4 C) (Oral)   Resp (!) 30   Ht 5\' 10"  (1.778 m)   Wt 58.5 kg   SpO2 91%   BMI 18.51 kg/m   Physical Exam Vitals and nursing note reviewed.  Constitutional:      General: He is in acute distress.     Appearance: He is ill-appearing and toxic-appearing.     Comments: Is cachectic, pale, breathing very quickly appears uncomfortable and ill  HENT:     Head: Normocephalic and atraumatic.  Eyes:     General: No scleral icterus.    Conjunctiva/sclera: Conjunctivae normal.  Cardiovascular:     Rate and Rhythm: Tachycardia present.  Pulmonary:     Effort: Pulmonary effort is normal. No respiratory distress.     Breath sounds: Normal breath sounds. No wheezing.     Comments: Tachypneic Abdominal:     General: Abdomen is flat.     Comments: Midline abdominal incision with staples in place, there is an area of dehiscence with necrotic looking  skin and very foul odor  JP drain in the left lower quadrant with black fluid  GJ tube in place in the right abdomen  Fuhs tenderness to palpation with guarding  Musculoskeletal:        General: No deformity or signs of injury.     Cervical back: Normal range of motion.  Skin:    Coloration: Skin is not jaundiced or pale.  Neurological:     General: No focal deficit present.     Mental Status: He is alert and oriented to person, place, and time. Mental status is at baseline.  Psychiatric:        Mood and Affect: Mood normal.        Behavior: Behavior normal.     LABS (all labs ordered are listed, but only abnormal results are displayed)  Labs Reviewed  CBC WITH DIFFERENTIAL/PLATELET - Abnormal; Notable for the following components:      Result Value   WBC 1.6 (*)    RBC 3.34 (*)    Hemoglobin 8.6 (*)    HCT 26.8 (*)    MCH 25.7 (*)    RDW 18.4 (*)    nRBC 1.2 (*)    Neutro Abs 0.8 (*)     Lymphs Abs 0.5 (*)    All other components within normal limits  COMPREHENSIVE METABOLIC PANEL - Abnormal; Notable for the following components:   Potassium 3.4 (*)    BUN 42 (*)    Calcium 8.3 (*)    Total Protein 5.4 (*)    Albumin 2.0 (*)    Alkaline Phosphatase 194 (*)    All other components within normal limits  RESP PANEL BY RT-PCR (FLU A&B, COVID) ARPGX2  CULTURE, BLOOD (ROUTINE X 2)  CULTURE, BLOOD (ROUTINE X 2)  LIPASE, BLOOD  URINALYSIS, COMPLETE (UACMP) WITH MICROSCOPIC  PATHOLOGIST SMEAR REVIEW  CBC WITH DIFFERENTIAL/PLATELET  LACTIC ACID, PLASMA  LACTIC ACID, PLASMA  TROPONIN I (HIGH SENSITIVITY)   ____________________________________________   ____________________________________________  RADIOLOGY Almeta Monas, personally viewed and evaluated these images (plain radiographs) as part of my medical decision making, as well as reviewing the written report by the radiologist.  ED MD interpretation:  I reviewed the abdominal CT which is concerning for possible abscess, possible peritonitis and free air, see full radiology report  I reviewed the CXR which does not show any acute cardiopulmonary process    ____________________________________________   PROCEDURES  Procedure(s) performed (including Critical Care):  Procedures   ____________________________________________   INITIAL IMPRESSION / ASSESSMENT AND PLAN / ED COURSE   60 year old male with complicated surgical history including perforated gastric ulcer complicated by anastomotic leak with JP drains who presents after the JP drain was pulled.  Patient is tachycardic tachypneic with borderline low BP.  He appears very unwell.  His abdominal midline incision looks to be partially dehisced and smells very foul.  The JP drain and left lower quadrant draining black fluid unclear if odor coming from that incision.  Patient left AMA from [place] after prolonged hospitalization for these complicated  surgical issues.  He was not supposed to be eating and was getting food through a [employer] but this was clogged and he has been barely eating at home.  Labs are notable for significant neutropenia absolute neutrophil around 1200, labs otherwise reassuring.  Lactate was not initially collected so as part of the sepsis protocol we will get the lactate and blood cultures.  He is given a liter of fluids initially.  Covered with Zosyn.      ____________________________________________   FINAL CLINICAL IMPRESSION(S) / ED DIAGNOSES  Final diagnoses:  Peritonitis [facility])     ED Discharge Orders     None        Note:  This document was prepared using Dragon voice recognition software and may include unintentional dictation errors.    Rada Hay, MD 02/05/21 [phone]

## 2021-02-05 NOTE — ED Notes (Signed)
Spoke w/ Shanon Brow   at [facility] transfer.  "They do not transport BLS"

## 2021-02-06 ENCOUNTER — Telehealth: Payer: Self-pay

## 2021-02-06 LAB — BLOOD CULTURE ID PANEL (REFLEXED) - BCID2

## 2021-02-06 NOTE — ED Notes (Signed)
Called blood culture result to [facility].

## 2021-02-07 LAB — CULTURE, BLOOD (ROUTINE X 2): Special Requests: ADEQUATE

## 2021-03-07 DEATH — deceased

## 2022-03-14 LAB — HISTOPLASMA ANTIGEN, URINE: Histoplasma Antigen, urine: <0.5 (ref <0.5)
